# Patient Record
Sex: Male | Born: 1945 | Race: Black or African American | Hispanic: No | Smoking: Current every day smoker
Health system: Southern US, Community
[De-identification: ages and names within clinical notes are randomized; demographics above are authoritative.]

## PROBLEM LIST (undated history)

## (undated) DIAGNOSIS — D649 Anemia, unspecified: Secondary | ICD-10-CM

## (undated) DIAGNOSIS — N186 End stage renal disease: Secondary | ICD-10-CM

## (undated) DIAGNOSIS — E119 Type 2 diabetes mellitus without complications: Secondary | ICD-10-CM

## (undated) DIAGNOSIS — I2510 Atherosclerotic heart disease of native coronary artery without angina pectoris: Secondary | ICD-10-CM

## (undated) DIAGNOSIS — I509 Heart failure, unspecified: Secondary | ICD-10-CM

## (undated) DIAGNOSIS — I213 ST elevation (STEMI) myocardial infarction of unspecified site: Secondary | ICD-10-CM

## (undated) DIAGNOSIS — I639 Cerebral infarction, unspecified: Secondary | ICD-10-CM

## (undated) DIAGNOSIS — H547 Unspecified visual loss: Secondary | ICD-10-CM

## (undated) DIAGNOSIS — I10 Essential (primary) hypertension: Secondary | ICD-10-CM

## (undated) DIAGNOSIS — M1990 Unspecified osteoarthritis, unspecified site: Secondary | ICD-10-CM

## (undated) DIAGNOSIS — F419 Anxiety disorder, unspecified: Secondary | ICD-10-CM

## (undated) DIAGNOSIS — I714 Abdominal aortic aneurysm, without rupture, unspecified: Secondary | ICD-10-CM

## (undated) DIAGNOSIS — R51 Headache: Secondary | ICD-10-CM

## (undated) DIAGNOSIS — E46 Unspecified protein-calorie malnutrition: Secondary | ICD-10-CM

## (undated) DIAGNOSIS — Z992 Dependence on renal dialysis: Secondary | ICD-10-CM

## (undated) HISTORY — DX: Anxiety disorder, unspecified: F41.9

## (undated) HISTORY — DX: Type 2 diabetes mellitus without complications: E11.9

## (undated) HISTORY — PX: OTHER SURGICAL HISTORY: SHX169

## (undated) HISTORY — DX: Essential (primary) hypertension: I10

## (undated) HISTORY — DX: Cerebral infarction, unspecified: I63.9

## (undated) SURGERY — Surgical Case
Anesthesia: *Unknown

---
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
## 2000-09-25 ENCOUNTER — Encounter: Admission: RE | Admit: 2000-09-25 | Discharge: 2000-09-25 | Payer: Self-pay | Admitting: General Surgery

## 2000-09-25 ENCOUNTER — Encounter: Payer: Self-pay | Admitting: General Surgery

## 2007-01-19 ENCOUNTER — Emergency Department (HOSPITAL_COMMUNITY): Admission: EM | Admit: 2007-01-19 | Discharge: 2007-01-19 | Payer: Self-pay | Admitting: Emergency Medicine

## 2010-03-28 ENCOUNTER — Emergency Department (HOSPITAL_COMMUNITY): Admission: EM | Admit: 2010-03-28 | Discharge: 2010-03-28 | Payer: Self-pay | Admitting: Emergency Medicine

## 2011-07-15 ENCOUNTER — Encounter: Payer: Self-pay | Admitting: Cardiology

## 2011-08-06 ENCOUNTER — Encounter: Payer: Self-pay | Admitting: *Deleted

## 2011-08-07 ENCOUNTER — Encounter: Payer: Self-pay | Admitting: Cardiology

## 2011-08-07 ENCOUNTER — Ambulatory Visit (INDEPENDENT_AMBULATORY_CARE_PROVIDER_SITE_OTHER): Payer: Medicare Other | Admitting: Cardiology

## 2011-08-07 ENCOUNTER — Ambulatory Visit (INDEPENDENT_AMBULATORY_CARE_PROVIDER_SITE_OTHER)
Admission: RE | Admit: 2011-08-07 | Discharge: 2011-08-07 | Disposition: A | Discharge status: Home / Self Care | Payer: Medicare Other | Source: Ambulatory Visit | Attending: Cardiology | Admitting: Cardiology

## 2011-08-07 ENCOUNTER — Encounter: Payer: Self-pay | Admitting: *Deleted

## 2011-08-07 DIAGNOSIS — R079 Chest pain, unspecified: Secondary | ICD-10-CM | POA: Insufficient documentation

## 2011-08-07 DIAGNOSIS — R072 Precordial pain: Secondary | ICD-10-CM

## 2011-08-07 DIAGNOSIS — Z01810 Encounter for preprocedural cardiovascular examination: Secondary | ICD-10-CM

## 2011-08-07 DIAGNOSIS — R0789 Other chest pain: Secondary | ICD-10-CM

## 2011-08-07 DIAGNOSIS — Z720 Tobacco use: Secondary | ICD-10-CM | POA: Insufficient documentation

## 2011-08-07 DIAGNOSIS — R0989 Other specified symptoms and signs involving the circulatory and respiratory systems: Secondary | ICD-10-CM

## 2011-08-07 DIAGNOSIS — I959 Hypotension, unspecified: Secondary | ICD-10-CM | POA: Insufficient documentation

## 2011-08-07 LAB — CBC WITH DIFFERENTIAL/PLATELET
Basophils Absolute: 0.1 K/uL (ref 0.0–0.1)
Basophils Relative: 1.3 % (ref 0.0–3.0)
Eosinophils Absolute: 0.5 K/uL (ref 0.0–0.7)
Eosinophils Relative: 7.6 % — ABNORMAL HIGH (ref 0.0–5.0)
HCT: 36 % — ABNORMAL LOW (ref 39.0–52.0)
Hemoglobin: 11.9 g/dL — ABNORMAL LOW (ref 13.0–17.0)
Lymphocytes Relative: 24.4 % (ref 12.0–46.0)
Lymphs Abs: 1.7 K/uL (ref 0.7–4.0)
MCHC: 33.1 g/dL (ref 30.0–36.0)
MCV: 87.2 fl (ref 78.0–100.0)
Monocytes Absolute: 0.5 K/uL (ref 0.1–1.0)
Monocytes Relative: 7.8 % (ref 3.0–12.0)
Neutro Abs: 4 K/uL (ref 1.4–7.7)
Neutrophils Relative %: 58.9 % (ref 43.0–77.0)
Platelets: 212 K/uL (ref 150.0–400.0)
RBC: 4.13 Mil/uL — ABNORMAL LOW (ref 4.22–5.81)
RDW: 13.4 % (ref 11.5–14.6)
WBC: 6.8 K/uL (ref 4.5–10.5)

## 2011-08-07 LAB — BASIC METABOLIC PANEL
BUN: 35 mg/dL — ABNORMAL HIGH (ref 6–23)
Chloride: 115 mEq/L — ABNORMAL HIGH (ref 96–112)
Creatinine, Ser: 2.9 mg/dL — ABNORMAL HIGH (ref 0.4–1.5)

## 2011-08-07 LAB — PROTIME-INR: Prothrombin Time: 11.8 s (ref 10.2–12.4)

## 2011-08-07 MED ORDER — AMLODIPINE BESYLATE 10 MG PO TABS: 10.0000 mg | ORAL_TABLET | Freq: Every day | ORAL | Status: DC

## 2011-08-07 NOTE — Assessment & Plan Note (Signed)
Schedule abdominal ultrasound to exclude aneurysm.

## 2011-08-07 NOTE — Assessment & Plan Note (Signed)
Patient counseled on discontinuing. 

## 2011-08-07 NOTE — Progress Notes (Signed)
HPI: 65 year old male for evaluation of dyspnea and chest pain. The patient has had dyspnea on exertion for approximately one year. It is occurring with less activity. It is relieved with rest. No orthopnea, PND, pedal edema or syncope. He also has chest tightness with exertion relieved with rest. There is shortness of breath but no nausea or diaphoresis. The pain is not pleuritic. Because of the above we were asked to further evaluate.  Current Outpatient Prescriptions  Medication Sig Dispense Refill  . atenolol (TENORMIN) 50 MG tablet Take 50 mg by mouth daily.        . nitroGLYCERIN (NITROSTAT) 0.4 MG SL tablet Place 0.4 mg under the tongue every 5 (five) minutes as needed.          No Known Allergies  Past Medical History  Diagnosis Date  . HTN (hypertension)   . Anxiety     No past surgical history on file.  History   Social History  . Marital Status: Single    Spouse Name: N/A    Number of Children: 2  . Years of Education: N/A   Occupational History  . Not on file.   Social History Main Topics  . Smoking status: Current Everyday Smoker  . Smokeless tobacco: Not on file  . Alcohol Use: Yes     Occasional  . Drug Use: Not on file  . Sexually Active: Not on file   Other Topics Concern  . Not on file   Social History Narrative  . No narrative on file    Family History  Problem Relation Age of Onset  . Heart attack Mother     MI in her 49s    ROS: no fevers or chills, productive cough, hemoptysis, dysphasia, odynophagia, melena, hematochezia, dysuria, hematuria, rash, seizure activity, orthopnea, PND, pedal edema, claudication. Remaining systems are negative.  Physical Exam: General:  Well developed/well nourished in NAD Skin warm/dry Patient not depressed No peripheral clubbing Back-normal HEENT-normal/normal eyelids Neck supple/normal carotid upstroke bilaterally; no bruits; no JVD; no thyromegaly chest - CTA/ normal expansion CV - bradycardic/normal  S1 and S2; no murmurs, rubs or gallops;  PMI nondisplaced Abdomen -NT/ND, no HSM, + bowel sounds, soft bruit with question pulsatile mass. 2+ femoral pulses, no bruits Ext-no edema, chords, diminished distal pulses Neuro-grossly nonfocal  ECG marked sinus bradycardia at a rate of 36. Left ventricular hypertrophy with repolarization abnormality.

## 2011-08-07 NOTE — Assessment & Plan Note (Signed)
Patient is significantly bradycardic. No syncope. Discontinue atenolol which was recently initiated and treat hypertension with Norvasc 10 mg daily. Follow blood pressure and pulse and adjust as needed.

## 2011-08-07 NOTE — Assessment & Plan Note (Signed)
Chest pain symptoms are concerning for new onset angina. Plan add aspirin. Proceed with cardiac catheterization. The risks and benefits including but not limited to myocardial infarction, stroke and death were discussed and patient agrees to proceed.

## 2011-08-07 NOTE — Patient Instructions (Addendum)
Your physician has requested that you have a cardiac catheterization. Cardiac catheterization is used to diagnose and/or treat various heart conditions. Doctors may recommend this procedure for a number of different reasons. The most common reason is to evaluate chest pain. Chest pain can be a symptom of coronary artery disease (CAD), and cardiac catheterization can show whether plaque is narrowing or blocking your heart's arteries. This procedure is also used to evaluate the valves, as well as measure the blood flow and oxygen levels in different parts of your heart. For further information please visit [email or web address]. Please follow instruction sheet, as given.   STOP ATENOLOL  START AMLODIPINE 10 MG ONCE DAILY START ASPIRIN 24 MG ONCE DAILY WITH FOOD  Your physician recommends that you return for lab work in: TODAY  A chest x-ray takes a picture of the organs and structures inside the chest, including the heart, lungs, and blood vessels. This test can show several things, including, whether the heart is enlarges; whether fluid is building up in the lungs; and whether pacemaker / defibrillator leads are still in place. AT [place] physician has requested that you have an abdominal aorta duplex. During this test, an ultrasound is used to evaluate the aorta. Allow 30 minutes for this exam. Do not eat after midnight the day before and avoid carbonated beverages    Your physician recommends that you schedule a follow-up appointment in: 6 WEEKS

## 2011-08-08 ENCOUNTER — Telehealth: Payer: Self-pay | Admitting: Cardiology

## 2011-08-08 DIAGNOSIS — N289 Disorder of kidney and ureter, unspecified: Secondary | ICD-10-CM

## 2011-08-08 NOTE — Telephone Encounter (Signed)
Caregiver returning nurse called from yesterday.

## 2011-08-08 NOTE — Telephone Encounter (Signed)
Spoke with pt caregiver,twana, she is aware of lab results. Pt will be scheduled for lexiscan myoview, instructions discussed over the phone.

## 2011-08-11 ENCOUNTER — Other Ambulatory Visit: Payer: Medicare Other | Admitting: *Deleted

## 2011-08-12 ENCOUNTER — Ambulatory Visit (HOSPITAL_COMMUNITY): Payer: Medicare Other | Attending: Cardiology | Admitting: Radiology

## 2011-08-12 ENCOUNTER — Other Ambulatory Visit (INDEPENDENT_AMBULATORY_CARE_PROVIDER_SITE_OTHER): Payer: Medicare Other | Admitting: *Deleted

## 2011-08-12 DIAGNOSIS — N289 Disorder of kidney and ureter, unspecified: Secondary | ICD-10-CM

## 2011-08-12 DIAGNOSIS — I498 Other specified cardiac arrhythmias: Secondary | ICD-10-CM

## 2011-08-12 DIAGNOSIS — R0602 Shortness of breath: Secondary | ICD-10-CM

## 2011-08-12 DIAGNOSIS — R079 Chest pain, unspecified: Secondary | ICD-10-CM

## 2011-08-12 DIAGNOSIS — R9431 Abnormal electrocardiogram [ECG] [EKG]: Secondary | ICD-10-CM

## 2011-08-12 DIAGNOSIS — R0989 Other specified symptoms and signs involving the circulatory and respiratory systems: Secondary | ICD-10-CM

## 2011-08-12 MED ORDER — REGADENOSON 0.4 MG/5ML IV SOLN
0.4000 mg | Freq: Once | INTRAVENOUS | Status: AC
  Administered 2011-08-12: 0.4 mg via INTRAVENOUS

## 2011-08-12 MED ORDER — TECHNETIUM TC 99M TETROFOSMIN IV KIT
11.0000 | PACK | Freq: Once | INTRAVENOUS | Status: AC | PRN
  Administered 2011-08-12: 11 via INTRAVENOUS

## 2011-08-12 MED ORDER — TECHNETIUM TC 99M TETROFOSMIN IV KIT
33.0000 | PACK | Freq: Once | INTRAVENOUS | Status: AC | PRN
  Administered 2011-08-12: 33 via INTRAVENOUS

## 2011-08-12 NOTE — Progress Notes (Signed)
[place] [address] [phone]  Cardiology Nuclear Med Trevor [name] is a 65 y.o. male [id number] March 04, 1946   Nuclear Med Background Indication for Stress Test:  Evaluation for Ischemia History:  No previous documented CAD and Abnormal EKG Cardiac Risk Factors: Family History - CAD, Hypertension and Smoker  Symptoms:  Chest Pain, Chest Pressure and DOE   Nuclear Pre-Procedure Caffeine/Decaff Intake:  None NPO After: 10:00pm   Lungs:  clear IV 0.9% NS with Angio Cath:  20g  IV Site: R Hand  IV Started by:  Matilde Haymaker, RN  Chest Size (in):  38 Cup Size: n/a  Height: 5\' 10"  (1.778 m)  Weight:  135 lb (61.236 kg)  BMI:  Body mass index is 19.37 kg/(m^2). Tech Comments:  NA    Nuclear Med Study 1 or 2 day study: 1 day  Stress Test Type:  Carlton Adam  Reading MD: Kirk Ruths, MD  Order Authorizing Provider:  B.Mckinley Olheiser  Resting Radionuclide: Technetium 35m Tetrofosmin  Resting Radionuclide Dose: 11.0 mCi   Stress Radionuclide:  Technetium 50m Tetrofosmin  Stress Radionuclide Dose: 33.0 mCi           Stress Protocol Rest HR: 41 Stress HR: 64  Rest BP: 142/73 Stress BP: 151/69  Exercise Time (min): n/a METS: n/a   Predicted Max HR: 155 bpm % Max HR: 41.29 bpm Rate Pressure Product: 9664   Dose of Adenosine (mg):  n/a Dose of Lexiscan: 0.4 mg  Dose of Atropine (mg): n/a Dose of Dobutamine: n/a mcg/kg/min (at max HR)  Stress Test Technologist: Perrin Maltese, EMT-P  Nuclear Technologist:  Charlton Amor, CNMT     Rest Procedure:  Myocardial perfusion imaging was performed at rest 45 minutes following the intravenous administration of Technetium 24m Tetrofosmin. Rest ECG: Sinus Bradycardia  Stress Procedure:  The patient received IV Lexiscan 0.4 mg over 15-seconds.  Technetium 36m Tetrofosmin injected at 30-seconds.  There were no significant changes with Lexiscan.  Quantitative spect  images were obtained after a 45 minute delay. Stress ECG: Uninterpretable due to baseline LVH with repolarization abnormality.  QPS Raw Data Images:  Acquisition technically good; evidence of LVE. Stress Images:  There is decreased uptake in the apex. Rest Images:  There is decreased uptake in the apex. Subtraction (SDS):  No evidence of ischemia. Transient Ischemic Dilatation (Normal <1.22):  1.02 Lung/Heart Ratio (Normal <0.45):  0.24  Quantitative Gated Spect Images QGS EDV:  148 ml QGS ESV:  88 ml QGS cine images:  Global hypokinesis. QGS EF: 40%  Impression Exercise Capacity:  Lexiscan with no exercise. BP Response:  Normal blood pressure response. Clinical Symptoms:  No chest pain. ECG Impression:  Baseline:  LVH.  EKG uninterpretable due to LVH at rest and stress. Comparison with Prior Nuclear Study: No images to compare  Overall Impression:  Abnormal stress nuclear study with apical thinning and global hypokinesis.   Kirk Ruths

## 2011-08-13 LAB — BASIC METABOLIC PANEL
BUN: 27 mg/dL — ABNORMAL HIGH (ref 6–23)
Chloride: 116 mEq/L — ABNORMAL HIGH (ref 96–112)
GFR: 34.53 mL/min — ABNORMAL LOW (ref >60.00)
Potassium: 4.8 mEq/L (ref 3.5–5.1)
Sodium: 140 mEq/L (ref 135–145)

## 2011-08-28 ENCOUNTER — Telehealth: Payer: Self-pay | Admitting: Cardiology

## 2011-08-28 NOTE — Telephone Encounter (Signed)
Paperwork has been sent to [facility]. Waiting for their call about an appt. Pt can call them at [phone] if he wants. Unable to reach pt or leave a message Trevor [name]

## 2011-08-28 NOTE — Telephone Encounter (Signed)
Pt called. And wanted to know where his referral is to kidney physician please let him know

## 2011-08-29 ENCOUNTER — Other Ambulatory Visit: Payer: Self-pay | Admitting: Nephrology

## 2011-08-29 DIAGNOSIS — N184 Chronic kidney disease, stage 4 (severe): Secondary | ICD-10-CM

## 2011-09-04 ENCOUNTER — Ambulatory Visit
Admission: RE | Admit: 2011-09-04 | Discharge: 2011-09-04 | Disposition: A | Discharge status: Home / Self Care | Payer: Medicare Other | Source: Ambulatory Visit | Attending: Nephrology | Admitting: Nephrology

## 2011-09-04 DIAGNOSIS — N184 Chronic kidney disease, stage 4 (severe): Secondary | ICD-10-CM

## 2011-09-08 ENCOUNTER — Encounter: Payer: Medicare Other | Admitting: Cardiology

## 2011-09-10 ENCOUNTER — Ambulatory Visit: Payer: Medicare Other | Admitting: Cardiology

## 2011-09-18 ENCOUNTER — Ambulatory Visit (INDEPENDENT_AMBULATORY_CARE_PROVIDER_SITE_OTHER): Payer: Medicare Other | Admitting: Cardiology

## 2011-09-18 ENCOUNTER — Encounter: Payer: Self-pay | Admitting: Cardiology

## 2011-09-18 DIAGNOSIS — I10 Essential (primary) hypertension: Secondary | ICD-10-CM

## 2011-09-18 DIAGNOSIS — N289 Disorder of kidney and ureter, unspecified: Secondary | ICD-10-CM | POA: Insufficient documentation

## 2011-09-18 DIAGNOSIS — R0989 Other specified symptoms and signs involving the circulatory and respiratory systems: Secondary | ICD-10-CM

## 2011-09-18 DIAGNOSIS — I428 Other cardiomyopathies: Secondary | ICD-10-CM | POA: Insufficient documentation

## 2011-09-18 MED ORDER — CARVEDILOL 6.25 MG PO TABS: 6.2500 mg | ORAL_TABLET | Freq: Two times a day (BID) | ORAL | Status: DC

## 2011-09-18 NOTE — Assessment & Plan Note (Signed)
Etiology unclear. Myoview showed apical thinning but no ischemia. Ordinarily I would like to proceed with cardiac catheterization to exclude coronary disease. However we will not do this at present given the severity of his renal insufficiency. Continue aspirin. His heart rate is much better after discontinuing atenolol. His bradycardia was most likely from the atenolol in the setting of renal insufficiency. Add Coreg 6.25 mg p.o. B.i.d. Both for his reduced LV function and blood pressure. Increase as tolerated. I will not add ACE inhibitor given the severity of his renal insufficiency. We will consider hydralazine and nitrates in the future. He is not clear on his medications. He will bring it back in 4 weeks and we will adjust as needed. Repeat echocardiogram once blood pressure control and medications titrated. His cardiomyopathy may be secondary to hypertension. Check TSH and ferritin. HIV was ordered by nephrology.

## 2011-09-18 NOTE — Progress Notes (Signed)
HPI: pleasant male I initially saw in September of 2012 with symptoms concerning for angina. We scheduled a cardiac catheterization but this was canceled because screening laboratories showed significant renal insufficiency (Cr 2.9). A Myoview was therefore performed in September of 2012 and revealed apical thinning, global hypokinesis and an ejection fraction of 40%. Renal dopplers revealed no RAS. Abdominal ultrasound was scheduled for bruit but patient did not show for the study. Since he was last seen, he feels somewhat better. He does have dyspnea on exertion but no orthopnea, PND or pedal edema. He notices a sharp pain in his chest with over exertion. No syncope.  Current Outpatient Prescriptions  Medication Sig Dispense Refill  . amLODipine (NORVASC) 10 MG tablet Take 1 tablet (10 mg total) by mouth daily.  30 tablet  11  . aspirin EC 81 MG tablet Take 1 tablet (81 mg total) by mouth daily.  150 tablet  2  . nitroGLYCERIN (NITROSTAT) 0.4 MG SL tablet Place 0.4 mg under the tongue every 5 (five) minutes as needed.           Past Medical History  Diagnosis Date  . HTN (hypertension)   . Anxiety   . Cardiomyopathy   . Renal insufficiency     No past surgical history on file.  History   Social History  . Marital Status: Single    Spouse Name: N/A    Number of Children: 2  . Years of Education: N/A   Occupational History  . Not on file.   Social History Main Topics  . Smoking status: Current Everyday Smoker  . Smokeless tobacco: Not on file  . Alcohol Use: Yes     Occasional  . Drug Use: Not on file  . Sexually Active: Not on file   Other Topics Concern  . Not on file   Social History Narrative  . No narrative on file    ROS: no fevers or chills, productive cough, hemoptysis, dysphasia, odynophagia, melena, hematochezia, dysuria, hematuria, rash, seizure activity, orthopnea, PND, pedal edema, claudication. Remaining systems are negative.  Physical  Exam: Well-developed well-nourished in no acute distress.  Skin is warm and dry.  HEENT is normal.  Neck is supple. No thyromegaly.  Chest is clear to auscultation with normal expansion.  Cardiovascular exam is regular rate and rhythm.  Abdominal exam nontender or distended. No masses palpated. Extremities show no edema. neuro grossly intact  ECG Sinus rhythm, diffuse TWI

## 2011-09-18 NOTE — Assessment & Plan Note (Signed)
Add Coreg 6.25 mg p.o. B.i.d. Increase medications as needed.

## 2011-09-18 NOTE — Assessment & Plan Note (Signed)
Now being followed by nephrology.

## 2011-09-18 NOTE — Patient Instructions (Addendum)
Your physician recommends that you schedule a follow-up appointment in: 4 WEEKS  START CARVEDILOL 6.25 MG ONE TABLET TWICE DAILY  MAKE SURE TO BRING ALL MEDICINE BOTTLES TO NEXT APPOINTMENT  Your physician recommends that you return for lab work in: [place] has requested that you have an abdominal aorta duplex. During this test, an ultrasound is used to evaluate the aorta. Allow 30 minutes for this exam. Do not eat after midnight the day before and avoid carbonated beverages     MAKE SURE TO FOLLOW UP WITH THE KIDNEY DOCTOR=DR [phone]

## 2011-09-18 NOTE — Assessment & Plan Note (Addendum)
Abdominal bruit-schedule ultrasound to exclude aneurysm.

## 2011-09-19 LAB — LIPID PANEL
LDL Cholesterol: 97 mg/dL (ref 0–99)
Total CHOL/HDL Ratio: 4
VLDL: 18 mg/dL (ref 0.0–40.0)

## 2011-10-07 ENCOUNTER — Emergency Department (HOSPITAL_COMMUNITY): Payer: Medicare Other

## 2011-10-07 ENCOUNTER — Other Ambulatory Visit: Payer: Self-pay

## 2011-10-07 ENCOUNTER — Encounter (HOSPITAL_COMMUNITY): Payer: Self-pay | Admitting: *Deleted

## 2011-10-07 ENCOUNTER — Inpatient Hospital Stay (HOSPITAL_COMMUNITY)
Admission: EM | Admit: 2011-10-07 | Discharge: 2011-10-10 | DRG: 065 | Disposition: A | Discharge status: Skilled Nursing Facility | Payer: Medicare Other | Attending: Internal Medicine | Admitting: Internal Medicine

## 2011-10-07 DIAGNOSIS — I498 Other specified cardiac arrhythmias: Secondary | ICD-10-CM | POA: Diagnosis present

## 2011-10-07 DIAGNOSIS — R072 Precordial pain: Secondary | ICD-10-CM

## 2011-10-07 DIAGNOSIS — I5043 Acute on chronic combined systolic (congestive) and diastolic (congestive) heart failure: Secondary | ICD-10-CM

## 2011-10-07 DIAGNOSIS — I5022 Chronic systolic (congestive) heart failure: Secondary | ICD-10-CM | POA: Diagnosis present

## 2011-10-07 DIAGNOSIS — N185 Chronic kidney disease, stage 5: Secondary | ICD-10-CM

## 2011-10-07 DIAGNOSIS — I428 Other cardiomyopathies: Secondary | ICD-10-CM | POA: Diagnosis present

## 2011-10-07 DIAGNOSIS — Z720 Tobacco use: Secondary | ICD-10-CM | POA: Insufficient documentation

## 2011-10-07 DIAGNOSIS — F17200 Nicotine dependence, unspecified, uncomplicated: Secondary | ICD-10-CM | POA: Diagnosis present

## 2011-10-07 DIAGNOSIS — Z01810 Encounter for preprocedural cardiovascular examination: Secondary | ICD-10-CM

## 2011-10-07 DIAGNOSIS — I639 Cerebral infarction, unspecified: Secondary | ICD-10-CM

## 2011-10-07 DIAGNOSIS — Z23 Encounter for immunization: Secondary | ICD-10-CM

## 2011-10-07 DIAGNOSIS — I509 Heart failure, unspecified: Secondary | ICD-10-CM | POA: Diagnosis present

## 2011-10-07 DIAGNOSIS — R001 Bradycardia, unspecified: Secondary | ICD-10-CM

## 2011-10-07 DIAGNOSIS — I4891 Unspecified atrial fibrillation: Secondary | ICD-10-CM | POA: Diagnosis present

## 2011-10-07 DIAGNOSIS — I517 Cardiomegaly: Secondary | ICD-10-CM

## 2011-10-07 DIAGNOSIS — W19XXXA Unspecified fall, initial encounter: Secondary | ICD-10-CM | POA: Diagnosis present

## 2011-10-07 DIAGNOSIS — R0989 Other specified symptoms and signs involving the circulatory and respiratory systems: Secondary | ICD-10-CM

## 2011-10-07 DIAGNOSIS — R4701 Aphasia: Secondary | ICD-10-CM | POA: Diagnosis present

## 2011-10-07 DIAGNOSIS — N183 Chronic kidney disease, stage 3 unspecified: Secondary | ICD-10-CM | POA: Diagnosis present

## 2011-10-07 DIAGNOSIS — H53469 Homonymous bilateral field defects, unspecified side: Secondary | ICD-10-CM | POA: Diagnosis present

## 2011-10-07 DIAGNOSIS — F1410 Cocaine abuse, uncomplicated: Secondary | ICD-10-CM

## 2011-10-07 DIAGNOSIS — I6340 Cerebral infarction due to embolism of unspecified cerebral artery: Principal | ICD-10-CM | POA: Diagnosis present

## 2011-10-07 DIAGNOSIS — N179 Acute kidney failure, unspecified: Secondary | ICD-10-CM

## 2011-10-07 DIAGNOSIS — Z8673 Personal history of transient ischemic attack (TIA), and cerebral infarction without residual deficits: Secondary | ICD-10-CM

## 2011-10-07 DIAGNOSIS — I129 Hypertensive chronic kidney disease with stage 1 through stage 4 chronic kidney disease, or unspecified chronic kidney disease: Secondary | ICD-10-CM | POA: Diagnosis present

## 2011-10-07 DIAGNOSIS — R471 Dysarthria and anarthria: Secondary | ICD-10-CM | POA: Diagnosis present

## 2011-10-07 LAB — PROTIME-INR
INR: 0.97 (ref 0.00–1.49)
Prothrombin Time: 13.1 seconds (ref 11.6–15.2)

## 2011-10-07 LAB — URINE MICROSCOPIC-ADD ON

## 2011-10-07 LAB — COMPREHENSIVE METABOLIC PANEL
ALT: 9 U/L (ref 0–53)
Albumin: 2.9 g/dL — ABNORMAL LOW (ref 3.5–5.2)
Alkaline Phosphatase: 95 U/L (ref 39–117)
BUN: 31 mg/dL — ABNORMAL HIGH (ref 6–23)
Chloride: 112 mEq/L (ref 96–112)
GFR calc Af Amer: 23 mL/min — ABNORMAL LOW (ref >90)
Glucose, Bld: 93 mg/dL (ref 70–99)
Potassium: 4.7 mEq/L (ref 3.5–5.1)
Sodium: 138 mEq/L (ref 135–145)
Total Bilirubin: 0.3 mg/dL (ref 0.3–1.2)
Total Protein: 6.7 g/dL (ref 6.0–8.3)

## 2011-10-07 LAB — RAPID URINE DRUG SCREEN, HOSP PERFORMED
Amphetamines: NOT DETECTED
Barbiturates: NOT DETECTED
Benzodiazepines: NOT DETECTED
Tetrahydrocannabinol: NOT DETECTED

## 2011-10-07 LAB — URINALYSIS, ROUTINE W REFLEX MICROSCOPIC
Bilirubin Urine: NEGATIVE
Glucose, UA: NEGATIVE mg/dL
Ketones, ur: NEGATIVE mg/dL
Protein, ur: >300 mg/dL — AB
pH: 5.5 (ref 5.0–8.0)

## 2011-10-07 LAB — DIFFERENTIAL
Eosinophils Absolute: 0.6 10*3/uL (ref 0.0–0.7)
Lymphs Abs: 1.9 10*3/uL (ref 0.7–4.0)
Monocytes Relative: 9 % (ref 3–12)
Neutro Abs: 3.8 10*3/uL (ref 1.7–7.7)
Neutrophils Relative %: 54 % (ref 43–77)

## 2011-10-07 LAB — CBC
Hemoglobin: 11.1 g/dL — ABNORMAL LOW (ref 13.0–17.0)
Platelets: 193 10*3/uL (ref 150–400)
RBC: 3.91 MIL/uL — ABNORMAL LOW (ref 4.22–5.81)
WBC: 7 10*3/uL (ref 4.0–10.5)

## 2011-10-07 LAB — POCT I-STAT, CHEM 8
Calcium, Ion: 1.17 mmol/L (ref 1.12–1.32)
Glucose, Bld: 96 mg/dL (ref 70–99)
HCT: 33 % — ABNORMAL LOW (ref 39.0–52.0)
Hemoglobin: 11.2 g/dL — ABNORMAL LOW (ref 13.0–17.0)
TCO2: 17 mmol/L (ref 0–100)

## 2011-10-07 MED ORDER — HYDRALAZINE HCL 25 MG PO TABS
25.0000 mg | ORAL_TABLET | Freq: Three times a day (TID) | ORAL | Status: DC
  Administered 2011-10-07 – 2011-10-10 (×7): 25 mg via ORAL
  Filled 2011-10-07 (×11): qty 1

## 2011-10-07 MED ORDER — ACETAMINOPHEN 325 MG PO TABS: 650.0000 mg | ORAL_TABLET | ORAL | Status: DC | PRN

## 2011-10-07 MED ORDER — SODIUM CHLORIDE 0.9 % IV SOLN
INTRAVENOUS | Status: DC
  Administered 2011-10-08 (×3): via INTRAVENOUS

## 2011-10-07 MED ORDER — HYDRALAZINE HCL 20 MG/ML IJ SOLN
10.0000 mg | INTRAMUSCULAR | Status: DC | PRN
  Administered 2011-10-08 – 2011-10-09 (×2): 10 mg via INTRAVENOUS
  Filled 2011-10-07 (×3): qty 0.5

## 2011-10-07 MED ORDER — ONDANSETRON HCL 4 MG/2ML IJ SOLN: 4.0000 mg | Freq: Three times a day (TID) | INTRAMUSCULAR | Status: DC | PRN

## 2011-10-07 MED ORDER — HEPARIN SODIUM (PORCINE) 5000 UNIT/ML IJ SOLN
5000.0000 [IU] | Freq: Three times a day (TID) | INTRAMUSCULAR | Status: DC
  Administered 2011-10-07 – 2011-10-10 (×8): 5000 [IU] via SUBCUTANEOUS
  Filled 2011-10-07 (×12): qty 1

## 2011-10-07 MED ORDER — ONDANSETRON HCL 4 MG/2ML IJ SOLN: 4.0000 mg | Freq: Four times a day (QID) | INTRAMUSCULAR | Status: DC | PRN

## 2011-10-07 MED ORDER — ASPIRIN 300 MG RE SUPP
300.0000 mg | Freq: Every day | RECTAL | Status: DC
  Filled 2011-10-07: qty 1

## 2011-10-07 MED ORDER — ACETAMINOPHEN 650 MG RE SUPP: 650.0000 mg | RECTAL | Status: DC | PRN

## 2011-10-07 MED ORDER — NITROGLYCERIN 0.4 MG SL SUBL: 0.4000 mg | SUBLINGUAL_TABLET | SUBLINGUAL | Status: DC | PRN

## 2011-10-07 MED ORDER — SODIUM CHLORIDE 0.9 % IV SOLN: INTRAVENOUS | Status: DC

## 2011-10-07 MED ORDER — ASPIRIN 325 MG PO TABS
325.0000 mg | ORAL_TABLET | Freq: Every day | ORAL | Status: DC
  Administered 2011-10-07 – 2011-10-10 (×4): 325 mg via ORAL
  Filled 2011-10-07 (×4): qty 1

## 2011-10-07 NOTE — ED Notes (Signed)
Patient is resting comfortably. 

## 2011-10-07 NOTE — ED Notes (Signed)
Asked patient if he needed anything at this time.  Response was no.

## 2011-10-07 NOTE — ED Notes (Signed)
Echo tech at bedside.

## 2011-10-07 NOTE — ED Notes (Signed)
Patients been asked if he is need of anything.  Negative.

## 2011-10-07 NOTE — Progress Notes (Signed)
CTSP by RN re uncontrolled SBP. Chart reviewed/patient examined. Currently he endorses continued RLE weakness that has not improved since admission. His BP is 219/65 and HR between 36 and 41 bpm which is not a new problem (see H/P). As an OP his cardiologist stopped his Lopressor because of the bradycardia. Preadmit he was on Norvasc and Coreg which were placed on hold. Due to uncontrolled HTN with associated bradycardia I will start Hydralazine 25mg  PO TID and also give hydralazine 10mg  IV q 4hours prn SBP>/= 180. This will drug will also help with afterload reduction regarding his grade 1 diastolic dysfunction.  Agree will Ebony Hail

## 2011-10-07 NOTE — ED Notes (Signed)
ekg has been done and given to dr. Jacqualin Combes.

## 2011-10-07 NOTE — Code Documentation (Signed)
65 yo male who was at home; housemate heard him fall at 60; he was in the kitchen making orange juice, and fell against the refrigerator.  He instructed her to call 9-1-1. EMS activated code stroke at [phone]. Pt arrived at 0904 and was cleared by EDP at that time.  Stroke team arrived at 0907.  Initially the house mate said the pt was LSN at 0750, but after questioning, she actually did not see pt since 2300 on 11/5 when he was in his usual state of health.  NIHSS is 14, but pt is not eligible for tPA or intervention d/t outside timeframe.

## 2011-10-07 NOTE — ED Provider Notes (Signed)
History     CSN: [id number] Arrival date & time: 10/07/2011  9:04 AM   First MD Initiated Contact with Patient 10/07/11 [phone]      Chief Complaint  Patient presents with  . Code Stroke    (Consider location/radiation/quality/duration/timing/severity/associated sxs/prior treatment) Patient is a 65 y.o. male presenting with fall. The history is provided by the EMS personnel. The history is limited by the condition of the patient.  Fall The accident occurred less than 1 hour ago. The fall occurred while walking. He fell from a height of 3 to 5 ft. He landed on carpet. There was no blood loss. The pain is moderate. He was not ambulatory at the scene. There was no entrapment after the fall. There was no drug use involved in the accident. There was no alcohol use involved in the accident. Pertinent negatives include no fever, no abdominal pain, no nausea and no vomiting. He has tried nothing for the symptoms.  Upon EMS arrival, pt weak on R side.  En route pt became non verbal.    Past Medical History  Diagnosis Date  . HTN (hypertension)   . Anxiety   . Cardiomyopathy   . Renal insufficiency     History reviewed. No pertinent past surgical history.  Family History  Problem Relation Age of Onset  . Heart attack Mother     MI in her 44s    History  Substance Use Topics  . Smoking status: Current Everyday Smoker  . Smokeless tobacco: Not on file  . Alcohol Use: Yes     Occasional      Review of Systems  Constitutional: Negative for fever and chills.  Gastrointestinal: Negative for nausea, vomiting and abdominal pain.  All other systems reviewed and are negative.    Allergies  Review of patient's allergies indicates no known allergies.  Home Medications   Current Outpatient Rx  Name Route Sig Dispense Refill  . AMLODIPINE BESYLATE 10 MG PO TABS Oral Take 1 tablet (10 mg total) by mouth daily. 30 tablet 11  . ASPIRIN EC 81 MG PO TBEC Oral Take 1 tablet (81 mg total)  by mouth daily. 150 tablet 2  . CARVEDILOL 6.25 MG PO TABS Oral Take 1 tablet (6.25 mg total) by mouth 2 (two) times daily. 60 tablet 11  . NITROGLYCERIN 0.4 MG SL SUBL Sublingual Place 0.4 mg under the tongue every 5 (five) minutes as needed.        There were no vitals taken for this visit.  Physical Exam  Nursing note and vitals reviewed. Constitutional: He appears well-developed and well-nourished. No distress.  HENT:  Head: Normocephalic and atraumatic.  Eyes: EOM are normal. Pupils are equal, round, and reactive to light.  Neck: Normal range of motion. Neck supple.  Cardiovascular: Normal rate and regular rhythm.   Pulmonary/Chest: Effort normal and breath sounds normal. No respiratory distress.  Abdominal: Soft. There is no tenderness.  Musculoskeletal: He exhibits no tenderness.  Neurological: He is alert. No cranial nerve deficit.       Equal grips bilaterally, will not raise R arm, unable to raise R leg.  Aphasia, Decreased sensation on R. ? Mild R facial droop  Skin: Skin is warm and dry.    ED Course  Procedures (including critical care time)  Labs Reviewed  CBC - Abnormal; Notable for the following:    RBC 3.91 (*)    Hemoglobin 11.1 (*)    HCT 32.0 (*)    All other components  within normal limits  DIFFERENTIAL - Abnormal; Notable for the following:    Eosinophils Relative 8 (*)    Basophils Relative 2 (*)    All other components within normal limits  COMPREHENSIVE METABOLIC PANEL - Abnormal; Notable for the following:    CO2 18 (*)    BUN 31 (*)    Creatinine, Ser 3.07 (*)    Albumin 2.9 (*)    GFR calc non Af Amer 20 (*)    GFR calc Af Amer 23 (*)    All other components within normal limits  POCT I-STAT, CHEM 8 - Abnormal; Notable for the following:    Chloride 115 (*)    BUN 34 (*)    Creatinine, Ser 3.20 (*)    Hemoglobin 11.2 (*)    HCT 33.0 (*)    All other components within normal limits  PROTIME-INR  URINALYSIS, ROUTINE W REFLEX MICROSCOPIC    URINE RAPID DRUG SCREEN (HOSP PERFORMED)   Ct Head Wo Contrast  10/07/2011  *RADIOLOGY REPORT*  Clinical Data: Code stroke, right-sided weakness, a fascia  CT HEAD WITHOUT CONTRAST  Technique:  Contiguous axial images were obtained from the base of the skull through the vertex without contrast.  Comparison: None.  Findings: The ventricular system is within normal limits in size for age and only mild cortical atrophy is present.  There is low attenuation within the right occipital lobe consistent with old infarct.  Mild small vessel ischemic change is present.  No hemorrhage, mass lesion, or acute infarction is seen.  On bone window images, no bony abnormality is seen.  IMPRESSION:  1.  No acute intracranial abnormality. 2.  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 3.  Mild atrophy and small vessel disease.  Critical Value/emergent results were called by telephone at the time of interpretation on 10/07/2011  at 9:25 a.m.  to  Dr. Audie Pinto, who verbally acknowledged these results.  Original Report Authenticated By: Joretta Bachelor, M.D.   Dg Chest Portable 1 View  10/07/2011  *RADIOLOGY REPORT*  Clinical Data: Recent fall, altered mental status  PORTABLE CHEST - 1 VIEW  Comparison: Chest x-ray of 08/07/2011  Findings: The lungs are clear.  The heart is mildly enlarged and stable.  No bony abnormality is seen.  IMPRESSION: Stable mild cardiomegaly.  No active lung disease.  Original Report Authenticated By: Joretta Bachelor, M.D.     No diagnosis found.    Date: 10/07/2011  Rate: 40  Rhythm: ectopic atrial bradycardia  QRS Axis: normal  Intervals: normal  ST/T Wave abnormalities: T wave inversion laterally  Conduction Disutrbances:none  Narrative Interpretation:   Old EKG Reviewed: none available   MDM  Pt presented due to R sided weakness after fall this am.  Last seen normal yesterday.  Per person living with them, he was talking this am but she does not know when symptoms started.  He is aphasic, R sided  weakness.  Neuro at bedside.  CT neg for anything acute.  Neuro recommends admit to medicine.  Does not want to do lytics since cannot get definite time frame.  Consulted hospitalist.          Molli Posey, MD Resident 10/07/11 [place], MD 10/15/11 [phone]

## 2011-10-07 NOTE — ED Provider Notes (Signed)
I saw and evaluated the patient, reviewed the resident's note and I agree with the findings and plan.     Date: 10/07/2011  Rate: 40  Rhythm: sinus bradycardia  QRS Axis: normal  Intervals: normal  ST/T Wave abnormalities: nonspecific ST/T changes  Conduction Disutrbances:none  Narrative Interpretation:   Old EKG Reviewed: none available      Dot Lanes, MD 10/07/11 1137

## 2011-10-07 NOTE — Progress Notes (Signed)
*  PRELIMINARY RESULTS* Echocardiogram 2D Echocardiogram has been performed.  Roxine Caddy [facility] 10/07/2011, 2:55 PM

## 2011-10-07 NOTE — ED Notes (Signed)
Per EMS pt from home. Family reports pt fell at 08:10. Family reports after fall pt had right side deficit, non-verbal. Pt presents with right sided weakness, can't lift right arm. Hypertensive at 208/96, bradycardic 35. Pt non-verbal with EMS. IV 20G in left forearm. CBG 108.

## 2011-10-07 NOTE — ED Notes (Signed)
Patients temperature not able to get orally.  (stroke rn/rita"wasn't important at this time")

## 2011-10-07 NOTE — Consult Note (Signed)
Reason for Consult:right sided weakness Referring Physician: Audie [name]  CC: Right sided weakness  HPI: Trevor [name] is an 65 y.o. male is asked to the ED after being found down on the floor after a fall by friend. It seems that the patient was last seen normal at approximately 11 PM last evening by his friend. It seemed that he awakened this morning and was able to get to the kitchen and start preparing food but if he was at baseline at that time. The patient did not interact with anyone prior to being found on the floor. The patient is unable to give any degree of accurate history at this time.  Past Medical History  Diagnosis Date  . HTN (hypertension)   . Anxiety   . Cardiomyopathy   . Renal insufficiency        BPH      Bradycardia  History reviewed. No pertinent past surgical history.  Family History  Problem Relation Age of Onset  . Heart attack Mother     MI in her 16s    Social History:  reports that he has been smoking.  He does not have any smokeless tobacco history on file. He reports that he drinks alcohol. His drug history not on file. On review of old ED notes it was seen that the patient does have a history of crack use.  No Known Allergies  Medications: Unknown at this time  ROS: Unable to obtain  Blood pressure 158/107, pulse 32, temperature 97.5 F (36.4 C), temperature source Oral, resp. rate 16, SpO2 100.00%.  Neurologic Examination: Mental Status: Alert. Speaks little but is able to get one or two words out as appropriate answers to questions.  Able to follow simple commands.  Although no distinct neglect can be appreciated seems to defer to the left. Cranial Nerves: II-Discs flat bilaterally. Visual fields grossly intact. III/IV/VI-Extraocular movements intact.  Pupils reactive bilaterally. V/VII-Decrease in right NLF. VIII-grossly intact IX/X-decreased gag XI-decreased right shoulder shrug XII-midline tongue extension Motor: Increased tone on the  right. RUE strength at 2/5, 0-1/5 RLE strength. 5/5 strength on the left. Sensory: Pinprick and light touch decreased on the right Deep Tendon Reflexes: 2+ and symmetric throughout with absent AJ's bilaterally Plantars: Downgoing on the left and upgoing on the right Cerebellar: Normal finger-to-nose and heel-to-shin testing not performed.   Results for orders placed during the hospital encounter of 10/07/11 (from the past 48 hour(s))  CBC     Status: Abnormal   Collection Time   10/07/11  9:23 AM      Component Value Range Comment   WBC 7.0  4.0 - 10.5 (K/uL)    RBC 3.91 (*) 4.22 - 5.81 (MIL/uL)    Hemoglobin 11.1 (*) 13.0 - 17.0 (g/dL)    HCT 32.0 (*) 39.0 - 52.0 (%)    MCV 81.8  78.0 - 100.0 (fL)    MCH 28.4  26.0 - 34.0 (pg)    MCHC 34.7  30.0 - 36.0 (g/dL)    RDW 13.6  11.5 - 15.5 (%)    Platelets 193  150 - 400 (K/uL)   DIFFERENTIAL     Status: Abnormal   Collection Time   10/07/11  9:23 AM      Component Value Range Comment   Neutrophils Relative 54  43 - 77 (%)    Neutro Abs 3.8  1.7 - 7.7 (K/uL)    Lymphocytes Relative 28  12 - 46 (%)    Lymphs Abs 1.9  0.7 - 4.0 (K/uL)    Monocytes Relative 9  3 - 12 (%)    Monocytes Absolute 0.6  0.1 - 1.0 (K/uL)    Eosinophils Relative 8 (*) 0 - 5 (%)    Eosinophils Absolute 0.6  0.0 - 0.7 (K/uL)    Basophils Relative 2 (*) 0 - 1 (%)    Basophils Absolute 0.1  0.0 - 0.1 (K/uL)   PROTIME-INR     Status: Normal   Collection Time   10/07/11  9:23 AM      Component Value Range Comment   Prothrombin Time 13.1  11.6 - 15.2 (seconds)    INR 0.97  0.00 - 1.49    POCT I-STAT, CHEM 8     Status: Abnormal   Collection Time   10/07/11  9:38 AM      Component Value Range Comment   Sodium 143  135 - 145 (mEq/L)    Potassium 4.8  3.5 - 5.1 (mEq/L)    Chloride 115 (*) 96 - 112 (mEq/L)    BUN 34 (*) 6 - 23 (mg/dL)    Creatinine, Ser 3.20 (*) 0.50 - 1.35 (mg/dL)    Glucose, Bld 96  70 - 99 (mg/dL)    Calcium, Ion 1.17  1.12 - 1.32 (mmol/L)     TCO2 17  0 - 100 (mmol/L)    Hemoglobin 11.2 (*) 13.0 - 17.0 (g/dL)    HCT 33.0 (*) 39.0 - 52.0 (%)     Ct Head Wo Contrast  10/07/2011  *RADIOLOGY REPORT*  Clinical Data: Code stroke, right-sided weakness, a fascia  CT HEAD WITHOUT CONTRAST  Technique:  Contiguous axial images were obtained from the base of the skull through the vertex without contrast.  Comparison: None.  Findings: The ventricular system is within normal limits in size for age and only mild cortical atrophy is present.  There is low attenuation within the right occipital lobe consistent with old infarct.  Mild small vessel ischemic change is present.  No hemorrhage, mass lesion, or acute infarction is seen.  On bone window images, no bony abnormality is seen.  IMPRESSION:  1.  No acute intracranial abnormality. 2.  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 3.  Mild atrophy and small vessel disease.  Critical Value/emergent results were called by telephone at the time of interpretation on 10/07/2011  at 9:25 a.m.  to  Dr. Audie [name], who verbally acknowledged these results.  Original Report Authenticated By: Joretta Bachelor, M.D.     Assessment/Plan:  [name] is a 65 year old male that presents with difficulty with speech and right-sided numbness and weakness. Suspect left brain stroke.  Head CT shows no acute changes.  Patient has risk factors of hypertension and cardiomyopathy. Also a smoker and has a history of illicit drug abuse. With a definite last seen normal time of 11 PM last evening the patient is out that time window for TPA and therefore TPA was not administered today. He will still require admission and workup/management for his acute stroke.  1. MRI of the brain without contrast 2. Urine drug screen  3. Echocardiogram and carotid Dopplers  4. Fasting lipid panel and hemoglobin A1c 5. PT OT and speech therapy consults 6. Patient will need home medications to be investigated. If patient is on no anticoagulants would start aspirin  325 mg daily.  This will need to be given as 300 mg per rectum until swallowing has been fully evaluated.  Will re-evaluate this choice of prophylactic medication after  medication list is available.  Trevor Goodell, MD [facility] [phone] 10/07/2011, 10:01 AM

## 2011-10-07 NOTE — ED Notes (Signed)
Rn/sabrina ordered collection of urine(in and out).  Order-verbal.  Color of urine cloudy/red(appears to have blood).  Collection of the urine was uneventful.

## 2011-10-07 NOTE — H&P (Addendum)
Trevor [name] [id number] 12-28-1945  Referring physician: PCP: Karoline Caldwell Referring physician: Dr. Audie Pinto in the emergency department. Cardiologist: Kirk Ruths, M.D. Nephrologist: Mauricia Area, M.D.  Chief Complaint: Slurred speech  HPI:  Transported from home by EMS. Patient fell at 810 this morning. Housemate heard him fall.  Right upper extremity weakness was noted as was hypertension and bradycardia. Patient was noted to be nonverbal. Code stroke was activated. Last seen normal 2300 on November 5. NIHSS equals 14. Not eligible for TPA secondary to being outside of the time window.  Patient was seen by Dr. Doy Mince of neurology. He was noted at that time to follow simple commands and be able to speak a few words. CT head was negative it was recommended the patient be referred for admission.  At that time of my examination the patient is able to say yes or no. He cannot tell me what happened. He can provide very little history.  He was noted to have marked sinus bradycardia in the emergency room.  Seen in the office by Dr. Stanford Breed 08/07/2011 and noted at that time to have a marked sinus bradycardia with a rate of 36. Left ventricular hypertrophy with repolarization abnormality. Atenolol was discontinued at that time and Norvasc was started. Cardiac catheterization was planned for chest pain. He was seen in followup on October 18 again by Dr. Stanford Breed. His Myoview was grossly abnormal as detailed below. Given the patient's kidney disease cardiac catheterization was not pursued. Choroid was added at that time 6.25 mg by mouth twice a day. ACE inhibitor was not added secondary to renal disease. At that time his medications were:  carvedilol 6.25 mg by mouth twice a day Norvasc 10 mg by mouth twice a day Aspirin 81 mg by mouth daily next nitroglycerin sublingual 0.4 mg every 5 minutes as needed  Past Medical History  Diagnosis Date  . HTN (hypertension)   . Anxiety   .  Cardiomyopathy   . Renal insufficiency   Myoview was therefore performed in September of 2012 and revealed apical thinning, global hypokinesis and an ejection fraction of 40%.   History reviewed. No pertinent past surgical history.  Social History:  reports that he has been smoking.  He does not have any smokeless tobacco history on file. He reports that he drinks alcohol. His drug history not on file. Allergies: No Known Allergies Family History  Problem Relation Age of Onset  . Heart attack Mother     MI in her 77s   Prior to Admission medications   Medication Sig Start Date End Date Taking? Authorizing Provider  amLODipine (NORVASC) 10 MG tablet Take 1 tablet (10 mg total) by mouth daily. 08/07/11 08/06/12  Lelon Perla, MD  aspirin EC 81 MG tablet Take 1 tablet (81 mg total) by mouth daily. 08/07/11   Lelon Perla, MD  carvedilol (COREG) 6.25 MG tablet Take 1 tablet (6.25 mg total) by mouth 2 (two) times daily. 09/18/11 09/17/12  Lelon Perla, MD  nitroGLYCERIN (NITROSTAT) 0.4 MG SL tablet Place 0.4 mg under the tongue every 5 (five) minutes as needed.      Historical Provider, MD   Review of Systems:  Denies fever changes to his vision sore throat rash chest pain shortness of breath nausea vomiting abdominal pain diarrhea dysuria. Review of systems is unreliable secondary to aphasia.  Physical Exam: General:  Lying flat in bed in the emergency room. Nontoxic. Telemetry shows a bradycardia at a rate of 30-40. Eyes: Pupils equal  round react to light. Normal lids irises the conjunctiva. ENT: Grossly normal lips and tongue. Neck: No lymphadenopathy or masses. No thyromegaly. Cardiovascular: Regular rate, bradycardic. No murmur rub or gallop, no lower extremity edema Respiratory: Clear to auscultation bilaterally. No wheezes, rales, rhonchi. Abdomen: Soft, nontender, nondistended. Skin: Appears unremarkable. No rash lesions or or induration seen. Musculoskeletal: Grossly normal  tone in the left upper and left lower extremity. Increased tone in the right upper extremity. He is able to raise his right hand above the bed and grip her grip strength is weaker compared to the left side. Psychiatric: Unable to assess Neurologic: Cranial nerves appear grossly unremarkable.  Labs on Admission:  Results for orders placed during the hospital encounter of 10/07/11 (from the past 24 hour(s))  CBC     Status: Abnormal   Collection Time   10/07/11  9:23 AM      Component Value Range   WBC 7.0  4.0 - 10.5 (K/uL)   RBC 3.91 (*) 4.22 - 5.81 (MIL/uL)   Hemoglobin 11.1 (*) 13.0 - 17.0 (g/dL)   HCT 32.0 (*) 39.0 - 52.0 (%)   MCV 81.8  78.0 - 100.0 (fL)   MCH 28.4  26.0 - 34.0 (pg)   MCHC 34.7  30.0 - 36.0 (g/dL)   RDW 13.6  11.5 - 15.5 (%)   Platelets 193  150 - 400 (K/uL)  DIFFERENTIAL     Status: Abnormal   Collection Time   10/07/11  9:23 AM      Component Value Range   Neutrophils Relative 54  43 - 77 (%)   Neutro Abs 3.8  1.7 - 7.7 (K/uL)   Lymphocytes Relative 28  12 - 46 (%)   Lymphs Abs 1.9  0.7 - 4.0 (K/uL)   Monocytes Relative 9  3 - 12 (%)   Monocytes Absolute 0.6  0.1 - 1.0 (K/uL)   Eosinophils Relative 8 (*) 0 - 5 (%)   Eosinophils Absolute 0.6  0.0 - 0.7 (K/uL)   Basophils Relative 2 (*) 0 - 1 (%)   Basophils Absolute 0.1  0.0 - 0.1 (K/uL)  COMPREHENSIVE METABOLIC PANEL     Status: Abnormal   Collection Time   10/07/11  9:23 AM      Component Value Range   Sodium 138  135 - 145 (mEq/L)   Potassium 4.7  3.5 - 5.1 (mEq/L)   Chloride 112  96 - 112 (mEq/L)   CO2 18 (*) 19 - 32 (mEq/L)   Glucose, Bld 93  70 - 99 (mg/dL)   BUN 31 (*) 6 - 23 (mg/dL)   Creatinine, Ser 3.07 (*) 0.50 - 1.35 (mg/dL)   Calcium 9.1  8.4 - 10.5 (mg/dL)   Total Protein 6.7  6.0 - 8.3 (g/dL)   Albumin 2.9 (*) 3.5 - 5.2 (g/dL)   AST 12  0 - 37 (U/L)   ALT 9  0 - 53 (U/L)   Alkaline Phosphatase 95  39 - 117 (U/L)   Total Bilirubin 0.3  0.3 - 1.2 (mg/dL)   GFR calc non Af Amer 20 (*)  >90 (mL/min)   GFR calc Af Amer 23 (*) >90 (mL/min)  PROTIME-INR     Status: Normal   Collection Time   10/07/11  9:23 AM      Component Value Range   Prothrombin Time 13.1  11.6 - 15.2 (seconds)   INR 0.97  0.00 - 1.49   POCT I-STAT, CHEM 8     Status:  Abnormal   Collection Time   10/07/11  9:38 AM      Component Value Range   Sodium 143  135 - 145 (mEq/L)   Potassium 4.8  3.5 - 5.1 (mEq/L)   Chloride 115 (*) 96 - 112 (mEq/L)   BUN 34 (*) 6 - 23 (mg/dL)   Creatinine, Ser 3.20 (*) 0.50 - 1.35 (mg/dL)   Glucose, Bld 96  70 - 99 (mg/dL)   Calcium, Ion 1.17  1.12 - 1.32 (mmol/L)   TCO2 17  0 - 100 (mmol/L)   Hemoglobin 11.2 (*) 13.0 - 17.0 (g/dL)   HCT 33.0 (*) 39.0 - 52.0 (%)  URINALYSIS, ROUTINE W REFLEX MICROSCOPIC     Status: Abnormal   Collection Time   10/07/11 10:42 AM      Component Value Range   Color, Urine YELLOW  YELLOW    Appearance CLOUDY (*) CLEAR    Specific Gravity, Urine 1.014  1.005 - 1.030    pH 5.5  5.0 - 8.0    Glucose, UA NEGATIVE  NEGATIVE (mg/dL)   Hgb urine dipstick LARGE (*) NEGATIVE    Bilirubin Urine NEGATIVE  NEGATIVE    Ketones, ur NEGATIVE  NEGATIVE (mg/dL)   Protein, ur >300 (*) NEGATIVE (mg/dL)   Urobilinogen, UA 0.2  0.0 - 1.0 (mg/dL)   Nitrite NEGATIVE  NEGATIVE    Leukocytes, UA NEGATIVE  NEGATIVE   URINE MICROSCOPIC-ADD ON     Status: Abnormal   Collection Time   10/07/11 10:42 AM      Component Value Range   Squamous Epithelial / LPF FEW (*) RARE    WBC, UA 0-2  <3 (WBC/hpf)   RBC / HPF TOO NUMEROUS TO COUNT  <3 (RBC/hpf)   Bacteria, UA FEW (*) RARE    Casts GRANULAR CAST (*) NEGATIVE   URINE RAPID DRUG SCREEN (HOSP PERFORMED)     Status: Abnormal   Collection Time   10/07/11 10:43 AM      Component Value Range   Opiates NONE DETECTED  NONE DETECTED    Cocaine POSITIVE (*) NONE DETECTED    Benzodiazepines NONE DETECTED  NONE DETECTED    Amphetamines NONE DETECTED  NONE DETECTED    Tetrahydrocannabinol NONE DETECTED  NONE DETECTED      Barbiturates NONE DETECTED  NONE DETECTED     Radiological Exams on Admission: Ct Head Wo Contrast  10/07/2011  *RADIOLOGY REPORT*  Clinical Data: Code stroke, right-sided weakness, a fascia  CT HEAD WITHOUT CONTRAST  Technique:  Contiguous axial images were obtained from the base of the skull through the vertex without contrast.  Comparison: None.  Findings: The ventricular system is within normal limits in size for age and only mild cortical atrophy is present.  There is low attenuation within the right occipital lobe consistent with old infarct.  Mild small vessel ischemic change is present.  No hemorrhage, mass lesion, or acute infarction is seen.  On bone window images, no bony abnormality is seen.  IMPRESSION:  1.  No acute intracranial abnormality. 2.  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 3.  Mild atrophy and small vessel disease.  Critical Value/emergent results were called by telephone at the time of interpretation on 10/07/2011  at 9:25 a.m.  to  Dr. Audie Pinto, who verbally acknowledged these results.  Original Report Authenticated By: Joretta Bachelor, M.D.   Dg Chest Portable 1 View  10/07/2011  *RADIOLOGY REPORT*  Clinical Data: Recent fall, altered mental status  PORTABLE CHEST -  1 VIEW  Comparison: Chest x-ray of 08/07/2011  Findings: The lungs are clear.  The heart is mildly enlarged and stable.  No bony abnormality is seen.  IMPRESSION: Stable mild cardiomegaly.  No active lung disease.  Original Report Authenticated By: Joretta Bachelor, M.D.     EKG: Independently reviewed. Marked bradycardia, narrow complex. Possible atrial ectopic rhythm. Left ventricular hypertrophy with repolarization abnormality. No acute changes.  Assessment/Plan  65 year old man presenting with speech and right-sided numbness and weakness.  Dysarthria and right upper extremity weakness: As below.  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 He has been evaluated by neurology. We will admit for stroke  evaluation.  Marked sinus bradycardia: Secondary to beta blocker therapy. Previously had marked sinus bradycardia on atenolol. Will discontinue Coreg. Monitor for need to intervene with atropine were temporary pacing. Currently asymptomatic and hemodynamically stable.  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 Gentle IV fluids.  Substance abuse, cocaine: Clinical social work [place].  Chronic systolic heart failure: Compensated. No beta blocker therapy secondary to marked sinus bradycardia as well as cocaine use. Has not been on ACE inhibitor in the past secondary to his chronic kidney disease. He appears to be slightly dry today.   Code Status: Full Family Communication: None present Disposition Plan: Pending further evaluation.     Mayer Masker MD 10/07/2011, 12:55 PM

## 2011-10-08 ENCOUNTER — Encounter (HOSPITAL_COMMUNITY): Payer: Self-pay | Admitting: *Deleted

## 2011-10-08 ENCOUNTER — Inpatient Hospital Stay (HOSPITAL_COMMUNITY): Payer: Medicare Other

## 2011-10-08 DIAGNOSIS — I4891 Unspecified atrial fibrillation: Secondary | ICD-10-CM | POA: Diagnosis present

## 2011-10-08 DIAGNOSIS — I498 Other specified cardiac arrhythmias: Secondary | ICD-10-CM

## 2011-10-08 DIAGNOSIS — I6789 Other cerebrovascular disease: Secondary | ICD-10-CM

## 2011-10-08 DIAGNOSIS — I6350 Cerebral infarction due to unspecified occlusion or stenosis of unspecified cerebral artery: Secondary | ICD-10-CM

## 2011-10-08 LAB — LIPID PANEL
HDL: 33 mg/dL — ABNORMAL LOW (ref >39)
Triglycerides: 111 mg/dL (ref <150)

## 2011-10-08 LAB — HEMOGLOBIN A1C
Hgb A1c MFr Bld: 5.4 % (ref <5.7)
Mean Plasma Glucose: 108 mg/dL (ref <117)

## 2011-10-08 MED ORDER — AMLODIPINE BESYLATE 10 MG PO TABS
10.0000 mg | ORAL_TABLET | Freq: Every day | ORAL | Status: DC
  Administered 2011-10-08 – 2011-10-10 (×3): 10 mg via ORAL
  Filled 2011-10-08 (×3): qty 1

## 2011-10-08 NOTE — Consult Note (Signed)
Patient seen and examined and history reviewed. Agree with above findings and plan. No evidence of atrial fibrillation on monitor. Sinus brady with pac's. Given CVA will schedule TEE tomorrow. Patient has chronic LV dysfunction with EF of 40%. He is not a candidate for ACEi or ARB due to CKD. Not a candidate for beta blocker due to bradycardia. Appropriate therapy would be combination of hydralazine and nitrates for BP and LV dysfunction. No evidence of volume overload currently.  Luana Shu 10/08/2011 4:57 PM

## 2011-10-08 NOTE — Progress Notes (Addendum)
Pt has only voided x1 during my shift today. Urine was clear, dark brown. Dr. Thereasa Solo notified of urine output.  IVF infusing per md order. No orders received.    Trevor [name], Kaylyn Layer

## 2011-10-08 NOTE — Progress Notes (Signed)
Subjective: I have personally reviewed the patient's history and data base. I have discussed his care with the stroke service. At the present time the patient is sitting up in a chair at the bedside and resting comfortably. He denies chest pain fevers chills nausea vomiting shortness of breath or abdominal pain. He is exhibiting some mild word finding delay but is otherwise able to communicate well.  Objective: Weight change:   Intake/Output Summary (Last 24 hours) at 10/08/11 1136 Last data filed at 10/08/11 0900  Gross per 24 hour  Intake    495 ml  Output    825 ml  Net   -330 ml   Blood pressure 160/61, pulse 55, temperature 98.6 F (37 C), temperature source Oral, resp. rate 16, height 6\' 1"  (1.854 m), weight 63.6 kg (140 lb 3.4 oz), SpO2 100.00%. Temp:  [97.3 F (36.3 C)-98.6 F (37 C)] 98.6 F (37 C) (11/07 0900) Pulse Rate:  [36-55] 55  (11/07 1013) Resp:  [13-18] 16  (11/07 0900) BP: (132-219)/(54-114) 160/61 mmHg (11/07 0900) SpO2:  [99 %-100 %] 100 % (11/07 0900) Weight:  [63.6 kg (140 lb 3.4 oz)] 140 lb 3.4 oz (63.6 kg) (11/06 1855)  Physical Exam: General: No acute respiratory distress Lungs: Clear to auscultation bilaterally without wheezes or crackles Cardiovascular: Bradycardia is appreciable with an irregularly irregular rhythm at the time of my auscultation without appreciable gallop rub or murmur Abdomen: Nontender, nondistended, soft, bowel sounds positive, no rebound, no ascites, no appreciable mass Extremities: No significant cyanosis, clubbing, or edema bilateral lower extremities Neurologic: I agree with the exam as per the stroke team-please see their note  Lab Results:  [facility] 10/07/11 0938 10/07/11 0923  NA 143 138  K 4.8 4.7  CL 115* 112  CO2 -- 18*  GLUCOSE 96 93  BUN 34* 31*  CREATININE 3.20* 3.07*  CALCIUM -- 9.1  MG -- --  PHOS -- --    Basename 10/07/11 0923  AST 12  ALT 9  ALKPHOS 95  BILITOT 0.3  PROT 6.7  ALBUMIN 2.9*   No  results found for this basename: LIPASE:2,AMYLASE:2 in the last 72 hours  Basename 10/07/11 0938 10/07/11 0923  WBC -- 7.0  NEUTROABS -- 3.8  HGB 11.2* 11.1*  HCT 33.0* 32.0*  MCV -- 81.8  PLT -- 193   No results found for this basename: CKTOTAL:3,CKMB:3,CKMBINDEX:3,TROPONINI:3 in the last 72 hours No results found for this basename: POCBNP:3 in the last 72 hours No results found for this basename: DDIMER:2 in the last 72 hours No results found for this basename: HGBA1C:12 in the last 72 hours  Basename 10/08/11 0530  CHOL 148  HDL 33*  LDLCALC 93  TRIG 111  CHOLHDL 4.5  LDLDIRECT --   No results found for this basename: TSH,T4TOTAL,FREET3,T3FREE,THYROIDAB in the last 72 hours No results found for this basename: VITAMINB12:2,FOLATE:2,FERRITIN:2,TIBC:2,IRON:2,RETICCTPCT:2 in the last 72 hours  Micro Results: Recent Results (from the past 240 hour(s))  MRSA PCR SCREENING     Status: Normal   Collection Time   10/07/11  8:04 PM      Component Value Range Status Comment   MRSA by PCR NEGATIVE  NEGATIVE  Final     Studies/Results: Scheduled Meds:   . aspirin  300 mg Rectal Daily   Or  . aspirin  325 mg Oral Daily  . heparin  5,000 Units Subcutaneous Q8H  . hydrALAZINE  25 mg Oral Q8H  . DISCONTD: sodium chloride   Intravenous STAT  Continuous Infusions:   . sodium chloride 75 mL/hr at 10/08/11 1000   PRN Meds:.acetaminophen, acetaminophen, hydrALAZINE, nitroGLYCERIN, ondansetron (ZOFRAN) IV, DISCONTD: ondansetron (ZOFRAN) IV  Assessment/Plan:  Stroke MRI reveals evidence of cerebral strokes. I have discussed the case with the stroke team who is following along. We feel that a TEE is indicated given the bilateral nature of this patient's acute strokes. I have consulted the patient's cardiologist to accomplish this. For now we will continue aspirin therapy. If his TEE suggests a cardiac source of embolus we will need to consider full anticoagulation. End the patient  passed his swallowing screen we will begin a regular diet.  Cardiomyopathy As noted above a TEE is to be accomplished. There is no evidence of severe volume overload or an acute exacerbation of heart failure.  Cocaine abuse The patient has been counseled the absolute need to discontinue cocaine abuse. We will ask the social worker continue to educate him on the opportunities available in the community to assist him in doing so.  Sinus bradycardia The majority the patient's telemetry at this point is indicated a sinus bradycardia. At the time of my exam however the patient is definitely irregularly irregular consistent with a atrial fibrillation. We will discontinue the patient's beta blockers. I have asked the patient's cardiologist to see him in consultation.  Acute renal failure with Chronic kidney disease (CKD), stage III (moderate) The patient's baseline renal function appears to be 2.5-2.8. I suspect that his current exacerbation is due to a combination of dehydration as well as poor perfusion related to severe bradycardia. We'll assure that the patient is well hydrated and we will follow his renal function.  A-fib Please see my discussion above. Cardiology has been consulted.    LOS: 1 day   Takeya Marquis T 10/08/2011, 11:36 AM

## 2011-10-08 NOTE — Progress Notes (Signed)
Stroke Team Progress Note Subjective: Mr. Delante is a 65 year old male that presents with difficulty with speech and right-sided numbness and weakness. Suspect left brain stroke. Head CT shows no acute changes. Patient has risk factors of hypertension and cardiomyopathy. Also a smoker and has a history of illicit drug abuse. With a definite last seen normal time of 11 PM last evening the patient is out that time window for TPA and therefore TPA was not administered .he has shown slight improvement overnight he is able to speak but still has some mild expressive difficulties. His right lower extremity remains weak her right arm strength has improved Objective:d. Vital signs in last 8 hours: Blood pressure 160/61, pulse 55, temperature 98.6 F (37 C), temperature source Oral, resp. rate 16, height 6\' 1"  (1.854 m), weight 63.6 kg (140 lb 3.4 oz), SpO2 100.00%.  Intake/Output from previous day: 11/06 0701 - 11/07 0700 In: 495 [I.V.:495] Out: 825 [Urine:825]   IV Intake:    . sodium chloride 75 mL/hr at 10/08/11 1000    Diet: General, thin liquids  Activity: Up with assistance  DVT Prophylaxis:  Sq heparin  Physical Exam:  Alert. Speaks speaks but is nonfluent. There is definite word hesitancy. He is able to name and repeat well. He has good comprehension. He is able to tell me on the names of 5 animals in 1 minute.Cranial Nerves:  II- Visual acuity intact. Dense left homonymous hemianopsia III/IV/VI-Extraocular movements intact. Pupils reactive bilaterally.  V/VII-Decrease in right NLF.  VIII-grossly intact  IX/X-decreased gag  XI-decreased right shoulder shrug  XII-midline tongue extension  Motor: Increased tone on the right. RUE strength at 4/5, 0-1/5 RLE strength. 5/5 strength on the left. Mild right upper extremity drift present. Diminished fine finger movements on the right. Orbits left over right upper extremity. Right grip is weak. Sensory: Pinprick and light touch decreased on  the right  Deep Tendon Reflexes: 2+ and symmetric throughout with absent AJ's bilaterally  Plantars: Downgoing on the left and upgoing on the right  Cerebellar: Normal finger-to-nose and heel-to-shin testing not performed.   Studies: Results for orders placed during the hospital encounter of 10/07/11 (from the past 24 hour(s))  MRSA PCR SCREENING     Status: Normal   Collection Time   10/07/11  8:04 PM      Component Value Range   MRSA by PCR NEGATIVE  NEGATIVE   LIPID PANEL     Status: Abnormal   Collection Time   10/08/11  5:30 AM      Component Value Range   Cholesterol 148  0 - 200 (mg/dL)   Triglycerides 111  <150 (mg/dL)   HDL 33 (*) >39 (mg/dL)   Total CHOL/HDL Ratio 4.5     VLDL 22  0 - 40 (mg/dL)   LDL Cholesterol 93  0 - 99 (mg/dL)   MRI brain:  L ACA small patchy  embolic infarcts, R PCA infarct old plus new components. MRA of the brain shows focal narrowing of the mid portions of the left anterior cerebral and right posterior cerebral arteries suggestive of embolic disease. 2-D echo shows ejection fraction of 40-45% with moderate left ventricular hypertrophy. No definite cardiac thrombus is noted.  Assessment: SHRI ENFINGER is an 65 y.o. year old male with bilateral embolic infarcts etiology to be determined. He has significant risk factors of hypertension and cardiomyopathy.He has an old right posterior cerebral artery infarct on CT scan but denies any clinical symptoms related to that though on  exam he has an obvious left homonymous hemianopsia.[facility] day # 1.  Treatment/Plan: TEE in  Am for cardiac source of embolism. Start amlodipine for hypertension. Stop coreg due to bradycardia.physical occupational and speech therapy consults. Check hemoglobin A1c and Doppler studies. Mobilize out of bed. Likely need a rehabilitation consult. Continue aspirin 325 mg a day for now.  BIBY,SHARON AVNP, ANP-BC, GNP-BC,  [facility] 11/7/201211:08 AM

## 2011-10-08 NOTE — Consult Note (Signed)
CARDIOLOGY CONSULT NOTE  Patient ID: Trevor [name] MRN: [id number] DOB/AGE: 1945/12/07 65 y.o.  Admit date: 10/07/2011 Referring Physician Dr Thereasa Solo   Primary Physician  [facility] Primary Cardiologist  Dr Stanford Breed  Reason for Consultation  CVA, ?AFib  HPI:    Trevor [name] is a 65 y.o. male without a history of CAD.      He developed RLE numbness 11-3. He came to the ER and was admitted with a CVA. No chest pain or shortness of breath. The patient was evaluated by Dr. Thereasa Solo and there was concern for atrial fibrillation. In the setting of a CVA he also needs an TEE and cardiology was asked to evaluate him.  Trevor [name] has had no chest pain or shortness of breath. He still complains of right lower extremity weakness and was not able to stand on his right leg today. He does not feel his heart skipped beats even though he is having frequent PACs on telemetry. Telemetry was reviewed and no atrial fibrillation.  A telemetry strip in the computer was seen where his heart rate was irregular secondary to premature beats and there was artifact mimicking atrial fibrillation but we do not believe it was true atrial fibrillation. Currently Trevor [name]  is resting comfortably. He is compliant with blood pressure medications but continues to smoke. Of note, his urinalysis was positive for cocaine.   Past Medical History  Diagnosis Date  . HTN (hypertension)   . Anxiety   . Cardiomyopathy   . Renal insufficiency    A Myoview was  performed in September of 2012 and revealed apical thinning, global hypokinesis and an ejection fraction of 40%  History reviewed. No pertinent past surgical history.  No Known Allergies Prescriptions prior to admission  Medication Sig Dispense Refill  . amLODipine (NORVASC) 10 MG tablet Take 1 tablet (10 mg total) by mouth daily.  30 tablet  11  . aspirin EC 81 MG tablet Take 1 tablet (81 mg total) by mouth daily.  150 tablet  2  . carvedilol (COREG) 6.25 MG tablet  Take 1 tablet (6.25 mg total) by mouth 2 (two) times daily.  60 tablet  11  . nitroGLYCERIN (NITROSTAT) 0.4 MG SL tablet Place 0.4 mg under the tongue every 5 (five) minutes as needed.          History   Social History  . Marital Status: Single    Spouse Name: N/A    Number of Children: 2  . Years of Education: N/A   Occupational History  . Not on file.   Social History Main Topics  . Smoking status: Current Everyday Smoker  . Smokeless tobacco: Not on file  . Alcohol Use: Yes     Occasional  . Drug Use: Not on file  . Sexually Active: Not on file   Other Topics Concern  . Not on file   Social History Narrative  . No narrative on file    Family History  Problem Relation Age of Onset  . Heart attack Mother     MI in her 82s      Full 14 point review of systems complete and found to be negative unless listed  above  Physical Exam: Blood pressure 170/57, pulse 51, temperature 98.6 F (37 C), temperature source Oral, resp. rate 14, height 6\' 1"  (1.854 m), weight 140 lb 3.4 oz (63.6 kg), SpO2 98.00%.  General: Well developed, well nourished, in no acute distress Head: Eyes PERRLA, No xanthomas.  Normal cephalic and atramatic, poor dentition   Lungs: Clear bilaterally to auscultation and percussion. Heart: HRRR S1 S2, without MRG.  Pulses are 2+ & equal radially. LE pulses slightly decreased, R>L but palpable and capillary refill WNL.            No carotid bruit. No JVD.  No abdominal bruits. No femoral bruits. Abdomen: Bowel sounds are positive, abdomen soft and non-tender without masses or                  Hernia's noted. Msk: No joint deformity or effusions. Extremities: No clubbing, cyanosis or edema.  DP +1 Neuro: Alert and oriented X 3. Patient is unable to move his right lower extremity, right upper extremity strength is decreased. Psych:  Good affect, responds appropriately but has trouble finding words.  Labs:   Lab Results  Component Value Date   WBC 7.0  10/07/2011   HGB 11.2* 10/07/2011   HCT 33.0* 10/07/2011   MCV 81.8 10/07/2011   PLT 193 10/07/2011    Lab 10/07/11 0938 10/07/11 0923  NA 143 --  K 4.8 --  CL 115* --  CO2 -- 18*  BUN 34* --  CREATININE 3.20* --  CALCIUM -- 9.1  PROT -- 6.7  BILITOT -- 0.3  ALKPHOS -- 95  ALT -- 9  AST -- 12  GLUCOSE 96 --       Lab Results  Component Value Date   CHOL 148 10/08/2011   CHOL 159 09/18/2011   Lab Results  Component Value Date   HDL 33* 10/08/2011   HDL 44.30 09/18/2011   Lab Results  Component Value Date   LDLCALC 93 10/08/2011   LDLCALC 97 09/18/2011   Lab Results  Component Value Date   TRIG 111 10/08/2011   TRIG 90.0 09/18/2011   Lab Results  Component Value Date   CHOLHDL 4.5 10/08/2011   CHOLHDL 4 09/18/2011      2-D Echocardiogram: EF 40% with moderate LVH and grade 1 diastolic dysfunction. No significant valvular abnormalities and no TR Doppler jet still unable to estimate PA systolic pressure.   Radiology:  MRI : IMPRESSION: 1. Acute patchy infarcts in the left ACA territory. No mass effect or hemorrhage. 2. Acute-on-chronic patchy right PCA territory infarcts. Chest x-ray no acute disease      EKG:  Sinus bradycardia with LVH and repolarization abnormality  ASSESSMENT AND PLAN:   The patient was seen today by Peter Martinique, MD, the patient evaluated and the data reviewed.  He has severe sinus bradycardia with PACs. He should be continued on telemetry but no atrial fibrillation has been seen at this time. We will continue to follow him for this. We will also schedule a TEE for further evaluation.  Signed: Rosaria Ferries 10/08/2011, 12:25 PM Co-Sign MD

## 2011-10-08 NOTE — Progress Notes (Signed)
Speech Language/Pathology Speech Language Pathology Evaluation Patient Details Name: Trevor [name] MRN: [id number] DOB: [birth year] Today's Date: 10/08/2011  Problem List:  Patient Active Problem List  Diagnoses  . Chest pain  . Tobacco use disorder  . Hypertension  . Bruit  . Cardiomyopathy  . Renal insufficiency  . Stroke  . Cocaine abuse  . Severe sinus bradycardia  . Acute renal failure  . Chronic kidney disease (CKD), stage III (moderate)  . A-fib    Past Medical History:  Past Medical History  Diagnosis Date  . HTN (hypertension)   . Anxiety   . Cardiomyopathy   . Renal insufficiency    Past Surgical History: History reviewed. No pertinent past surgical history.  SLP Assessment/Plan/Recommendation Assessment Clinical Impression Statement: pt presents with a mild expressive and receptive aphasia characterized by fluent output with appropriate grammatical form, but delayed response and initiation time; decreased spontaneity of verbal output; dysnomia, particularly when asked to engage in unpredictable conversation;  difficulty executing two step commands and comprehending complex  verbal instructions.  Decreased insight into deficits.   SLP Recommendation/Assessment: Patient will need skilled Speech Lanaguage Pathology Services in the acute care venue to address identified deficits Problem List: Auditory comprehension;Verbal expression Therapy Diagnosis: Aphasia Plan Speech Therapy Frequency: min 2x/week Duration: 2 weeks Treatment/Interventions: Language facilitation;SLP instruction and feedback;Patient/family education Potential to Achieve Goals: Good Recommendation Equipment Recommended: Defer to next venue Individuals Consulted Consulted and Agree with Results and Recommendations: Patient  Pt passed RN stroke swallow screen; hence, formal SLP swallow eval is not warranted.  Juan Quam Laurice 10/08/2011, 11:49 AM

## 2011-10-08 NOTE — Progress Notes (Signed)
*  PRELIMINARY RESULTS*  Carotid Dopplers has been performed. Preliminary - No significant ICA stenosis bilaterally. Vertebral arteries are patent with antegrade flow.  Rich Brave 10/08/2011, 1:22 PM

## 2011-10-08 NOTE — Progress Notes (Signed)
Physical Therapy Evaluation Patient Details Name: Trevor [name] MRN: [id number] DOB: 02-02-1946 Today's Date: 10/08/2011  Problem List:  Patient Active Problem List  Diagnoses  . Chest pain  . Tobacco use disorder  . Hypertension  . Bruit  . Cardiomyopathy  . Renal insufficiency  . Stroke  . Cocaine abuse  . Severe sinus bradycardia  . Acute renal failure  . Chronic kidney disease (CKD), stage III (moderate)    Past Medical History:  Past Medical History  Diagnosis Date  . HTN (hypertension)   . Anxiety   . Cardiomyopathy   . Renal insufficiency    Past Surgical History: History reviewed. No pertinent past surgical history.  PT Assessment/Plan/Recommendation PT Assessment Clinical Impression Statement: Pt is a 65 y/o male admitted with right sided weakness s/p fall and the below problem list.  Pt would benefit from PT acutely to maximize independence, balance, and safety while facilitating d/c to [name] PT Recommendation/Assessment: Patient will need skilled PT in the acute care venue PT Problem List: Decreased strength;Decreased balance;Decreased mobility;Decreased cognition Barriers to Discharge: Decreased caregiver support Barriers to Discharge Comments: Will need 24 hour assist to safely d/c home.  Thus, recommend [name]. PT Therapy Diagnosis : Hemiplegia dominant side PT Plan PT Frequency: Min 4X/week PT Treatment/Interventions: DME instruction;Gait training;Functional mobility training;Balance training;Neuromuscular re-education;Cognitive remediation;Patient/family education PT Recommendation Follow Up Recommendations: Skilled nursing facility;24 hour supervision/assistance Equipment Recommended: Defer to next venue PT Goals  Acute Rehab PT Goals PT Goal Formulation: With patient Time For Goal Achievement: 2 weeks Pt will go Supine/Side to Sit: with supervision PT Goal: Supine/Side to Sit - Progress: Other (comment) (Set today) Pt will Transfer Sit to Stand/Stand  to Sit: with supervision PT Transfer Goal: Sit to Stand/Stand to Sit - Progress: Other (comment) (Set today) Pt will Transfer Bed to Chair/Chair to Bed: with supervision PT Transfer Goal: Bed to Chair/Chair to Bed - Progress: Other (comment) (Set today) Pt will Ambulate: 51 - 150 feet;with min assist;with least restrictive assistive device PT Goal: Ambulate - Progress: Other (comment) (Set today)  PT Evaluation Precautions/Restrictions  Precautions Precautions: Fall Required Braces or Orthoses: No Restrictions Weight Bearing Restrictions: No Prior Functioning  Home Living Lives With: Alone Receives Help From: Other (Comment) (None) Type of Home: Apartment Home Layout: One level Home Access: Elevator Home Adaptive Equipment: Straight cane Prior Function Level of Independence: Independent with basic ADLs;Independent with homemaking with ambulation;Independent with transfers;Requires assistive device for independence Able to Take Stairs?: Yes Driving: No Cognition Cognition Arousal/Alertness: Awake/alert Overall Cognitive Status: Impaired Orientation Level: Oriented X4 Problem Solving: Able to problem solve independently for tasks assessed;Other (comment) (Slow to process and problem solve.) Sensation/Coordination Sensation Light Touch: Impaired Detail (Tingling right LE.) Light Touch Impaired Details: Impaired RLE (Tingling) Stereognosis: Not tested Hot/Cold: Not tested Proprioception: Not tested Coordination Gross Motor Movements are Fluid and Coordinated: No Coordination and Movement Description: Right LE decreased strength to 2+/5.  Left LE 5/5. Extremity Assessment RUE Assessment RUE Assessment: Not tested LUE Assessment LUE Assessment: Not tested RLE Assessment RLE Assessment: Exceptions to [facility] RLE Strength RLE Overall Strength: Deficits;Other (Comment) (2+/5 throughout) LLE Assessment LLE Assessment: Within Functional Limits Mobility (including Balance) Bed  Mobility Bed Mobility: Yes Supine to Sit: 3: Mod assist;HOB flat Supine to Sit Details (indicate cue type and reason): Assist to right LE to facilitate motion off EOB and to trunk to translate anterior. Cues for sequence Transfers Transfers: Yes Sit to Stand: 4: Min assist;From bed;Other (comment) (2 trials) Sit to Stand Details (  indicate cue type and reason): Assist to block right LE to prevent buckling with facilitation at trunk for balance and extension. Stand to Sit: 4: Min assist;To bed;To chair/3-in-1;Other (comment) (2 trials (1 to bed, 1 to chair)) Stand to Sit Details: Assist to block right LE to prevent buckling and control eccentric flexion of knee.  Assist at trunk for balance.  Cues for sequence and hand placement. Stand Pivot Transfers: 3: Mod assist Stand Pivot Transfer Details (indicate cue type and reason): Assist to facilitate rotation through pelvis with cues for sequence with blocking to right LE to prevent buckling. Ambulation/Gait Ambulation/Gait: No Stairs: No Wheelchair Mobility Wheelchair Mobility: No  Posture/Postural Control Posture/Postural Control: No significant limitations Balance Balance Assessed: No Exercise    End of Session PT - End of Session Equipment Utilized During Treatment: Gait belt Activity Tolerance: Patient tolerated treatment well Patient left: in chair;with call bell in reach Nurse Communication: Mobility status for transfers;Other (comment) (Right shoulder guarded with cracking noise.  MD/RN aware) General Behavior During Session: [facility] for tasks performed Cognition: [facility] for tasks performed  Kendal Hymen M 10/08/2011, 10:28 AM

## 2011-10-09 ENCOUNTER — Ambulatory Visit (HOSPITAL_COMMUNITY): Admit: 2011-10-09 | Payer: Self-pay | Admitting: Cardiology

## 2011-10-09 ENCOUNTER — Encounter (HOSPITAL_COMMUNITY): Payer: Self-pay | Admitting: *Deleted

## 2011-10-09 ENCOUNTER — Encounter (HOSPITAL_COMMUNITY)
Admission: EM | Disposition: A | Discharge status: Skilled Nursing Facility | Payer: Self-pay | Source: Home / Self Care | Attending: Internal Medicine

## 2011-10-09 HISTORY — PX: TEE WITHOUT CARDIOVERSION: SHX5443

## 2011-10-09 LAB — CBC
HCT: 30.3 % — ABNORMAL LOW (ref 39.0–52.0)
MCH: 28 pg (ref 26.0–34.0)
MCV: 81.5 fL (ref 78.0–100.0)
Platelets: 178 10*3/uL (ref 150–400)
RBC: 3.72 MIL/uL — ABNORMAL LOW (ref 4.22–5.81)
WBC: 7.1 10*3/uL (ref 4.0–10.5)

## 2011-10-09 LAB — IRON AND TIBC
Iron: 67 ug/dL (ref 42–135)
TIBC: 249 ug/dL (ref 215–435)
UIBC: 182 ug/dL (ref 125–400)

## 2011-10-09 LAB — BASIC METABOLIC PANEL
CO2: 18 mEq/L — ABNORMAL LOW (ref 19–32)
Calcium: 8.7 mg/dL (ref 8.4–10.5)
Chloride: 112 mEq/L (ref 96–112)
Creatinine, Ser: 2.84 mg/dL — ABNORMAL HIGH (ref 0.50–1.35)
Glucose, Bld: 84 mg/dL (ref 70–99)
Sodium: 138 mEq/L (ref 135–145)

## 2011-10-09 LAB — FERRITIN: Ferritin: 263 ng/mL (ref 22–322)

## 2011-10-09 LAB — FOLATE: Folate: 6.8 ng/mL

## 2011-10-09 SURGERY — ECHOCARDIOGRAM, TRANSESOPHAGEAL
Anesthesia: Moderate Sedation

## 2011-10-09 MED ORDER — SODIUM CHLORIDE 0.9 % IV SOLN: 250.0000 mL | INTRAVENOUS | Status: DC

## 2011-10-09 MED ORDER — FENTANYL CITRATE 0.05 MG/ML IJ SOLN
INTRAMUSCULAR | Status: DC | PRN
  Administered 2011-10-09: 25 ug via INTRAVENOUS

## 2011-10-09 MED ORDER — FENTANYL CITRATE 0.05 MG/ML IJ SOLN
INTRAMUSCULAR | Status: AC
  Filled 2011-10-09: qty 2

## 2011-10-09 MED ORDER — MIDAZOLAM HCL 10 MG/2ML IJ SOLN
INTRAMUSCULAR | Status: AC
  Filled 2011-10-09: qty 2

## 2011-10-09 MED ORDER — MIDAZOLAM HCL 10 MG/2ML IJ SOLN
INTRAMUSCULAR | Status: DC | PRN
  Administered 2011-10-09 (×2): 2 mg via INTRAVENOUS

## 2011-10-09 MED ORDER — BUTAMBEN-TETRACAINE-BENZOCAINE 2-2-14 % EX AERO
INHALATION_SPRAY | CUTANEOUS | Status: DC | PRN
  Administered 2011-10-09: 2 via TOPICAL

## 2011-10-09 MED ORDER — ISOSORBIDE MONONITRATE ER 30 MG PO TB24
30.0000 mg | ORAL_TABLET | Freq: Every day | ORAL | Status: DC
  Administered 2011-10-09 – 2011-10-10 (×2): 30 mg via ORAL
  Filled 2011-10-09 (×2): qty 1

## 2011-10-09 NOTE — Progress Notes (Signed)
Occupational Therapy Evaluation Patient Details Name: Trevor [name] MRN: [id number] DOB: 12-13-1945 Today's Date: 10/09/2011  Problem List:  Patient Active Problem List  Diagnoses  . Chest pain  . Tobacco use disorder  . Hypertension  . Bruit  . Cardiomyopathy  . Renal insufficiency  . Stroke  . Cocaine abuse  . Severe sinus bradycardia  . Acute renal failure  . Chronic kidney disease (CKD), stage III (moderate)  . A-fib    Past Medical History:  Past Medical History  Diagnosis Date  . HTN (hypertension)   . Anxiety   . Cardiomyopathy   . Renal insufficiency    Past Surgical History: History reviewed. No pertinent past surgical history.  OT Assessment/Plan/Recommendation OT Assessment Clinical Impression Statement: Pt. will benefit from acute OT to increase functional independence with ADLs and decrease burden of care at next venue of care OT Recommendation/Assessment: Patient will need skilled OT in the acute care venue OT Problem List: Decreased strength;Decreased activity tolerance;Decreased cognition;Decreased coordination;Impaired vision/perception;Impaired balance (sitting and/or standing);Decreased knowledge of use of DME or AE;Decreased knowledge of precautions;Impaired sensation;Impaired UE functional use;Decreased safety awareness Barriers to Discharge: Decreased caregiver support OT Therapy Diagnosis : Generalized weakness;Disturbance of vision;Cognitive deficits;Hemiplegia dominant side OT Plan OT Frequency: Min 1X/week OT Treatment/Interventions: Self-care/ADL training;Neuromuscular education;DME and/or AE instruction;Therapeutic activities;Cognitive remediation/compensation;Visual/perceptual remediation/compensation;Patient/family education;Balance training OT Recommendation Follow Up Recommendations: Skilled nursing facility Equipment Recommended: Defer to next venue Individuals Consulted Consulted and Agree with Results and Recommendations: Patient OT  Goals Acute Rehab OT Goals OT Goal Formulation: With patient Time For Goal Achievement: 2 weeks ADL Goals Pt Will Perform Grooming: with set-up;Standing at sink;with supervision ADL Goal: Grooming - Progress: Other (comment) Pt Will Perform Upper Body Bathing: with set-up;Sitting, chair ADL Goal: Upper Body Bathing - Progress: Other (comment) Pt Will Perform Lower Body Bathing: with min assist;Sit to stand from bed ADL Goal: Lower Body Bathing - Progress: Other (comment) Pt Will Perform Upper Body Dressing: with set-up;Sitting, chair ADL Goal: Upper Body Dressing - Progress: Other (comment) Pt Will Perform Lower Body Dressing: with min assist;Sit to stand from bed ADL Goal: Lower Body Dressing - Progress: Other (comment) Pt Will Transfer to Toilet: with min assist;3-in-1 ADL Goal: Toilet Transfer - Progress: Other (comment) Pt Will Perform Toileting - Hygiene: with set-up;Sit to stand from 3-in-1/toilet ADL Goal: Toileting - Hygiene - Progress: Other (comment)  OT Evaluation Precautions/Restrictions  Precautions Precautions: Fall Required Braces or Orthoses: No Restrictions Weight Bearing Restrictions: No Prior Functioning   Prior Function Level of Independence: Independent with basic ADLs;Independent with homemaking with ambulation;Independent with gait;Independent with transfers Able to Take Stairs?: Yes Driving: No ADL ADL Eating/Feeding: NPO Grooming: Wash/dry face;Performed;Set up Where Assessed - Grooming: Supine, head of bed up Upper Body Bathing: Simulated;Chest;Right arm;Left arm;Abdomen;Moderate assistance Where Assessed - Upper Body Bathing: Sitting, bed Lower Body Bathing: Simulated;Maximal assistance Where Assessed - Lower Body Bathing: Sit to stand from bed Upper Body Dressing: Performed;Minimal assistance Upper Body Dressing Details (indicate cue type and reason): With donning gown and education on donning rt sleeve first due to rt side weakness Where  Assessed - Upper Body Dressing: Sitting, bed Lower Body Dressing: Simulated;Maximal assistance Where Assessed - Lower Body Dressing: Sit to stand from bed Toilet Transfer: Not assessed Toileting - Clothing Manipulation: Not assessed Toileting - Hygiene: Not assessed Where Assessed - Toileting Hygiene: Not assessed Tub/Shower Transfer: Not assessed Tub/Shower Transfer Method: Not assessed Equipment Used: Rolling walker ADL Comments: Pt. with rt side weakness and decreased fine motor coordination  of rt hand and requiring increased assist Vision/Perception  Vision - History Baseline Vision: Wears glasses all the time Visual History: Cataracts;Other (comment) (Per pt. rt eye legally blind) Patient Visual Report: Blurring of vision Vision - Assessment Eye Alignment: Within Functional Limits Vision Assessment: Vision not tested Additional Comments: Pt. not following commands when attempting to assess vision. Pt. able to track to both sides to determine time on clock on rt side of pt. within 5 mins of accuracy of the current time. Pt. reports blurriness is associated with not having his glasses here due to cataracts. Perception Perception: Within Functional Limits Praxis Praxis: Impaired Cognition Cognition Arousal/Alertness: Awake/alert Overall Cognitive Status: Impaired Orientation Level: Oriented X4 Following Commands: Follows multi-step commands inconsistently Safety/Judgement: Decreased awareness of safety precautions;Decreased safety judgement for tasks assessed Decreased Safety/Judgement: Decreased awareness of need for assistance Problem Solving: Able to problem solve independently for tasks assessed;Other (comment) Sensation/Coordination Sensation Light Touch: Appears Intact Light Touch Impaired Details: Impaired RLE Stereognosis: Not tested Hot/Cold: Not tested Proprioception: Not tested Additional Comments: Pt. does not report any changes in sensation with rt arm however  does report tingling of Rt LE Coordination Gross Motor Movements are Fluid and Coordinated: No Fine Motor Movements are Fluid and Coordinated: No Coordination and Movement Description: Rt UE 3/5 and fine motor coordination not intact. pt. with increased time to bring each finger tip to thumb and educated on practicing to improve the coordination Extremity Assessment RUE Assessment RUE Assessment: Exceptions to [place] Overall Strength: Deficits (Pt with 3/5 strength and decreased [place]) LUE Assessment LUE Assessment: Within Functional Limits Mobility  Bed Mobility Bed Mobility: Yes Supine to Sit: HOB flat;3: Mod assist Supine to Sit Details (indicate cue type and reason): Assist to trunk to translate anterior and for right LE secondary to weakness.  Increased time for problem solving and initiation. Verbal cues for sequence. Transfers Transfers: Yes Sit to Stand: 1: +2 Total assist;Patient percentage (comment);Other (comment);From bed;From chair/3-in-1 (+2 total assist (80%); 2 trials (1 from bed, 1 from chair)) Sit to Stand Details (indicate cue type and reason): Assist to translate trunk anterior over BOS with max cues for safest hand placement.  Facilitation to trunk for extension. Stand to Sit: 1: +2 Total assist;Patient percentage (comment);To chair/3-in-1;To bed;Other (comment) (+2 total assist (pt=80%); 2 trials (1 to chair, 1 to bed)) Stand to Sit Details: Assist to slow descent with manual facilitation at right knee for eccentric quadriceps control and max cues for safest hand placement. End of Session OT - End of Session Equipment Utilized During Treatment: Gait belt Activity Tolerance: Patient limited by fatigue Patient left: in bed;with call bell in reach Nurse Communication: Mobility status for transfers General Behavior During Session: Other (comment) (Lethargic, but easily engaged with session) Cognition: Impaired (Slow to process information and difficulty  problem solving)   Co-tx with P.T.   Salma Walrond,OTR/L 10/09/2011, 8:30 AM

## 2011-10-09 NOTE — Brief Op Note (Signed)
See echo report Kirk Ruths

## 2011-10-09 NOTE — H&P (Signed)
See H and P Kirk Ruths

## 2011-10-09 NOTE — Progress Notes (Signed)
Physical Therapy Treatment Patient Details Name: Trevor [name] MRN: [id number] DOB: Jun 09, 1946 Today's Date: 10/09/2011  PT Assessment/Plan  PT - Assessment/Plan Comments on Treatment Session: Co-treatment with Occupational Therapy.  See OT note for details and goals not included in PT note. PT Plan: Discharge plan remains appropriate;Frequency remains appropriate PT Frequency: Min 4X/week Follow Up Recommendations: Skilled nursing facility;24 hour supervision/assistance Equipment Recommended: Defer to next venue PT Goals  Acute Rehab PT Goals PT Goal: Supine/Side to Sit - Progress: Progressing toward goal PT Transfer Goal: Sit to Stand/Stand to Sit - Progress: Progressing toward goal PT Transfer Goal: Bed to Chair/Chair to Bed - Progress: Progressing toward goal PT Goal: Ambulate - Progress: Progressing toward goal  PT Treatment Precautions/Restrictions  Precautions Precautions: Fall Required Braces or Orthoses: No Restrictions Weight Bearing Restrictions: No Mobility (including Balance) Bed Mobility Bed Mobility: Yes Supine to Sit: HOB flat;3: Mod assist Supine to Sit Details (indicate cue type and reason): Assist to trunk to translate anterior and for right LE secondary to weakness.  Increased time for problem solving and initiation. Verbal cues for sequence. Transfers Transfers: Yes Sit to Stand: 1: +2 Total assist;Patient percentage (comment);Other (comment);From bed;From chair/3-in-1 (+2 total assist (80%); 2 trials (1 from bed, 1 from chair)) Sit to Stand Details (indicate cue type and reason): Assist to translate trunk anterior over BOS with max cues for safest hand placement.  Facilitation to trunk for extension. Stand to Sit: 1: +2 Total assist;Patient percentage (comment);To chair/3-in-1;To bed;Other (comment) (+2 total assist (pt=80%); 2 trials (1 to chair, 1 to bed)) Stand to Sit Details: Assist to slow descent with manual facilitation at right knee for eccentric  quadriceps control and max cues for safest hand placement. Stand Pivot Transfers: Not tested (comment) Ambulation/Gait Ambulation/Gait: Yes Ambulation/Gait Assistance: 1: +2 Total assist;Patient percentage (comment) (+2 total assist (pt=65%)) Ambulation/Gait Assistance Details (indicate cue type and reason): Assist at trunk for balance, right LE to advance secondary to decreased hip and ankle flexion, shift weight right to advance left LE, and for RW steering.  Max cues for sequencing. Ambulation Distance (Feet): 25 Feet Assistive device: Rolling walker Gait Pattern: Decreased step length - right;Decreased stance time - right;Decreased hip/knee flexion - right;Decreased dorsiflexion - right;Decreased weight shift to right Stairs: No Wheelchair Mobility Wheelchair Mobility: No  Posture/Postural Control Posture/Postural Control: No significant limitations Balance Balance Assessed: No Exercise    End of Session PT - End of Session Equipment Utilized During Treatment: Gait belt Activity Tolerance: Patient tolerated treatment well Patient left: in bed;with call bell in reach Nurse Communication: Mobility status for ambulation;Mobility status for transfers General Behavior During Session: Other (comment) (Lethargic, but easily engaged with session) Cognition: Impaired (Slow to process information and difficulty problem solving)  Santana Edell M 10/09/2011, 8:25 AM

## 2011-10-09 NOTE — OR Nursing (Signed)
Vs did not flow to epic. Posted in chart

## 2011-10-09 NOTE — Progress Notes (Signed)
Subjective: He has no complaints. He has worked with PT and feels like he had trouble with balance. He is scheduled for a TEE today. PT has recommended [name] and I have discussed this with him. He is in agreement with going to [name] to receive Rehab.   Objective: Weight change:   Intake/Output Summary (Last 24 hours) at 10/09/11 1514 Last data filed at 10/09/11 1400  Gross per 24 hour  Intake 2277.5 ml  Output   1800 ml  Net  477.5 ml   Blood pressure 167/56, pulse 60, temperature 97.9 F (36.6 C), temperature source Oral, resp. rate 16, height 6\' 1"  (1.854 m), weight 63.6 kg (140 lb 3.4 oz), SpO2 100.00%. Temp:  [97.4 F (36.3 C)-98.4 F (36.9 C)] 97.9 F (36.6 C) (11/08 1200) Pulse Rate:  [47-98] 60  (11/08 1200) Resp:  [14-19] 16  (11/08 0800) BP: (138-193)/(48-81) 167/56 mmHg (11/08 1200) SpO2:  [99 %-100 %] 100 % (11/08 1200)  Physical Exam: General: No acute respiratory distress Lungs: Clear to auscultation bilaterally without wheezes or crackles Cardiovascular: Bradycardia without appreciable gallop rub or murmur Abdomen: Nontender, nondistended, soft, bowel sounds positive, no rebound, no ascites, no appreciable mass Extremities: No significant cyanosis, clubbing, or edema bilateral lower extremities Neurologic: I agree with the exam as per the stroke team-please see their note  Lab Results:  [facility] 10/09/11 0510 10/07/11 0938 10/07/11 0923  NA 138 143 138  K 4.6 4.8 4.7  CL 112 115* 112  CO2 18* -- 18*  GLUCOSE 84 96 93  BUN 36* 34* 31*  CREATININE 2.84* 3.20* 3.07*  CALCIUM 8.7 -- 9.1  MG -- -- --  PHOS -- -- --    Basename 10/07/11 0923  AST 12  ALT 9  ALKPHOS 95  BILITOT 0.3  PROT 6.7  ALBUMIN 2.9*   No results found for this basename: LIPASE:2,AMYLASE:2 in the last 72 hours  Basename 10/09/11 0510 10/07/11 0938 10/07/11 0923  WBC 7.1 -- 7.0  NEUTROABS -- -- 3.8  HGB 10.4* 11.2* 11.1*  HCT 30.3* 33.0* 32.0*  MCV 81.5 -- 81.8  PLT 178 -- 193    No results found for this basename: CKTOTAL:3,CKMB:3,CKMBINDEX:3,TROPONINI:3 in the last 72 hours No results found for this basename: POCBNP:3 in the last 72 hours No results found for this basename: DDIMER:2 in the last 72 hours  Basename 10/08/11 0530  HGBA1C 5.4    Basename 10/08/11 0530  CHOL 148  HDL 33*  LDLCALC 93  TRIG 111  CHOLHDL 4.5  LDLDIRECT --   No results found for this basename: TSH,T4TOTAL,FREET3,T3FREE,THYROIDAB in the last 72 hours  Basename 10/09/11 0510  VITAMINB12 333  FOLATE 6.8  FERRITIN 263  TIBC 249  IRON 67  RETICCTPCT 1.5    Micro Results: Recent Results (from the past 240 hour(s))  MRSA PCR SCREENING     Status: Normal   Collection Time   10/07/11  8:04 PM      Component Value Range Status Comment   MRSA by PCR NEGATIVE  NEGATIVE  Final     Studies/Results: Scheduled Meds:    . amLODipine  10 mg Oral Daily  . aspirin  325 mg Oral Daily  . heparin  5,000 Units Subcutaneous Q8H  . hydrALAZINE  25 mg Oral Q8H   Continuous Infusions:    . sodium chloride 75 mL/hr at 10/09/11 1400   PRN Meds:.acetaminophen, acetaminophen, hydrALAZINE, nitroGLYCERIN, ondansetron (ZOFRAN) IV  Assessment/Plan:  Stroke MRI reveals evidence of cerebral strokes- a  TEE is indicated given the bilateral nature of this patient's acute strokes.For now we will continue aspirin therapy. If his TEE suggests a cardiac source of embolus we will need to consider full anticoagulation. We will plan on d/c to [name] when he is medically stable.   Cardiomyopathy As noted above a TEE is to be performed. There is no evidence of severe volume overload or an acute exacerbation of heart failure.  Cocaine abuse The patient has been counseled the absolute need to discontinue cocaine abuse. We will ask the social worker continue to educate him on the opportunities available in the community to assist him in doing so.  Sinus bradycardia The majority the patient's telemetry  at this point is indicated a sinus bradycardia. He has been evaluated by Dr [place] today. Hydralazine and Nitrates recommended for his systolic CHF. Coreg has been discontinued.   Acute renal failure with Chronic kidney disease (CKD), stage III (moderate) This is improving w/ hydration.  The patient's baseline renal function appears to be 2.5-2.8. I suspect that his current exacerbation is due to a combination of dehydration as well as poor perfusion related to severe bradycardia. We'll assure that the patient is well hydrated and we will follow his renal function.  HTN- On Norvasc at home. Hydralazine started in hospital. Can add Imdur today.      LOS: 2 days   Trevor [name] 10/09/2011, 3:14 PM

## 2011-10-09 NOTE — Progress Notes (Signed)
SUBJECTIVE: The patient is doing OK today.  He is presently ambulating slowly with PT.   No overnight events   OBJECTIVE: Physical Exam: Filed Vitals:   10/09/11 0300 10/09/11 0350 10/09/11 0400 10/09/11 0640  BP: 151/56 183/68 171/74 193/81  Pulse: 48 75 67   Temp:  98.3 F (36.8 C)    TempSrc:  Oral    Resp: 15 19 18    Height:      Weight:      SpO2: 99% 100% 100%     Intake/Output Summary (Last 24 hours) at 10/09/11 0801 Last data filed at 10/09/11 0600  Gross per 24 hour  Intake 1677.5 ml  Output   1750 ml  Net  -72.5 ml    Telemetry reveals sinus rhythm, no afib  GEN- The patient is elderly appearing, alert and oriented x 3 today.   Presently ambulating very slowly with PT,  fulll exam therefore not performed  LABS: Basic Metabolic Panel:  Basename 10/09/11 0510 10/07/11 0938 10/07/11 0923  NA 138 143 --  K 4.6 4.8 --  CL 112 115* --  CO2 18* -- 18*  GLUCOSE 84 96 --  BUN 36* 34* --  CREATININE 2.84* 3.20* --  CALCIUM 8.7 -- 9.1  MG -- -- --  PHOS -- -- --   Liver Function Tests:  Basename 10/07/11 0923  AST 12  ALT 9  ALKPHOS 95  BILITOT 0.3  PROT 6.7  ALBUMIN 2.9*   No results found for this basename: LIPASE:2,AMYLASE:2 in the last 72 hours CBC:  Basename 10/09/11 0510 10/07/11 0938 10/07/11 0923  WBC 7.1 -- 7.0  NEUTROABS -- -- 3.8  HGB 10.4* 11.2* --  HCT 30.3* 33.0* --  MCV 81.5 -- 81.8  PLT 178 -- 193   Cardiac Enzymes: No results found for this basename: CKTOTAL:3,CKMB:3,CKMBINDEX:3,TROPONINI:3 in the last 72 hours BNP: No results found for this basename: POCBNP:3 in the last 72 hours D-Dimer: No results found for this basename: DDIMER:2 in the last 72 hours Hemoglobin A1C:  Basename 10/08/11 0530  HGBA1C 5.4   Fasting Lipid Panel:  Basename 10/08/11 0530  CHOL 148  HDL 33*  LDLCALC 93  TRIG 111  CHOLHDL 4.5  LDLDIRECT --   Thyroid Function Tests: No results found for this basename:  TSH,T4TOTAL,FREET3,T3FREE,THYROIDAB in the last 72 hours Anemia Panel:  Basename 10/09/11 0510  VITAMINB12 --  FOLATE --  FERRITIN --  TIBC --  IRON --  RETICCTPCT 1.5    RADIOLOGY: Ct Head Wo Contrast  10/07/2011  *RADIOLOGY REPORT*  Clinical Data: Code stroke, right-sided weakness, a fascia  CT HEAD WITHOUT CONTRAST  Technique:  Contiguous axial images were obtained from the base of the skull through the vertex without contrast.  Comparison: None.  Findings: The ventricular system is within normal limits in size for age and only mild cortical atrophy is present.  There is low attenuation within the right occipital lobe consistent with old infarct.  Mild small vessel ischemic change is present.  No hemorrhage, mass lesion, or acute infarction is seen.  On bone window images, no bony abnormality is seen.  IMPRESSION:  1.  No acute intracranial abnormality. 2.  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 3.  Mild atrophy and small vessel disease.  Critical Value/emergent results were called by telephone at the time of interpretation on 10/07/2011  at 9:25 a.m.  to  Dr. Audie Pinto, who verbally acknowledged these results.  Original Report Authenticated By: Joretta Bachelor, M.D.   Mr  Brain Wo Contrast  10/08/2011  *RADIOLOGY REPORT*  Clinical Data: 65 year old male with right-sided weakness, memory loss, slurred speech, possible stroke.  MRI HEAD WITHOUT CONTRAST  Technique:  Multiplanar, multiecho pulse sequences of the brain and surrounding structures were obtained according to standard protocol without intravenous contrast.  Comparison: CT head without contrast 10/07/2011.  Findings: There is cortical and subcortical scattered restricted diffusion in the left ACA territory affecting the left cingulate gyrus and left superior frontal gyrus (series 11 images 20-26). Associated T2 and FLAIR hyperintensity no mass effect or associated hemorrhage.  Separately there is acute on chronic ischemia in the right PCA territory,  occipital lobe, and some involvement of the right aspect of the splenium (series 11 image 17).  Associated T2 and FLAIR hyperintensities superimposed on chronic encephalomalacia.  No acute hemorrhage at this site.  Decreased flow void in the distal most left vertebral artery suggesting stenosis.  Other major intracranial vascular flow voids are preserved.  No ventriculomegaly.  No significant mass effect.  No other intracranial mass lesion.  No extra-axial collection.  Negative pituitary, cervicomedullary junction and visualized cervical spine.  In addition to the acute and chronic right PCA findings there is scattered T2 and FLAIR hyperintensity in the cerebral white matter and brainstem.  Cerebellum is within normal limits.  There is moderate to patchy T2 hyperintensity in the deep gray matter nuclei.  Mild paranasal sinus mucosal thickening.  Mastoids are clear. Visualized orbit soft tissues are within normal limits.  Visualized bone marrow signal is within normal limits.  Visualized scalp soft tissues are within normal limits.  IMPRESSION: 1.  Acute patchy infarcts in the left ACA territory.  No mass effect or hemorrhage. 2.  Acute-on-chronic patchy right PCA territory infarcts. 3.  Age advanced signal changes in the white matter and deep gray matter nuclei, favor chronic small vessel disease. 4.  Intracranial MRA findings are below.  MRA HEAD WITHOUT CONTRAST  Technique:  Angiographic images of the Circle of Willis were obtained using MRA technique without intravenous contrast.  Comparison:  None.  Findings:  Antegrade flow in the posterior circulation. Irregularity in the distal left vertebral artery proximal to the left PICA origin which is patent.  The left vertebral beyond PICA is without significant stenosis.  Relatively dominant distal right vertebral artery is normal with normal right PICA.  Mild to moderate basilar artery irregularity with mild non hemodynamically significant mid basilar stenosis.   Superior cerebellar artery and PCA origins are patent.  There is moderate irregularity and focal stenosis in the right P1 segment.  There is moderate irregularity and focal stenosis in the distal left P2 segment.  Preserved distal PCA flow bilaterally.  Antegrade flow in both ICA siphons with moderate to severe cavernous ICA irregularity.  No significant stenosis with respect to the distal ICA vessel caliber.  Hypoplastic right ICA terminus owing to hypoplastic right ACA A1 segment.  Right MCA origin is normal.  Mild to moderate right MCA branch irregularity with no branch stenosis or occlusion.  Left ICA terminus is patent with dominant left ACA.  Anterior communicating artery is normal.  There is a high-grade stenosis in the right ACA A2 segment just beyond its origin.  Otherwise only mild bilateral ACA branch irregularity is noted, no left ACA branch stenosis or occlusion is identified on the included images.  Mild left MCA M1 segment irregularity.  Visualized left MCA branches are within normal limits.  IMPRESSION: 1.  Widespread intracranial large and medium-sized vessel atherosclerosis. 2.  No left ACA proximal stenosis or occlusion.  There is a moderate right PCA P1 segment stenosis with relatively preserved distal flow.  Original Report Authenticated By: Randall An, M.D.   Dg Chest Portable 1 View  10/07/2011  *RADIOLOGY REPORT*  Clinical Data: Recent fall, altered mental status  PORTABLE CHEST - 1 VIEW  Comparison: Chest x-ray of 08/07/2011  Findings: The lungs are clear.  The heart is mildly enlarged and stable.  No bony abnormality is seen.  IMPRESSION: Stable mild cardiomegaly.  No active lung disease.  Original Report Authenticated By: Joretta Bachelor, M.D.    ASSESSMENT AND PLAN:   1. S/p CVA,  No afib on telemetry,  Would likely benefit from event monitor on discharge,  TEE planned for later today to evaluate for embolic source 2. Bradycardia- stable and asymptomatic,  No further workup  planned at this point  IF TEE OK, no further CV inpatient eval planned,  Will need PT/OT.       Thompson Grayer, MD 10/09/2011 8:01 AM

## 2011-10-09 NOTE — Progress Notes (Signed)
Stroke Team Progress Note Subjective: [name] is a 65 year old male that presents with difficulty with speech and right-sided numbness and weakness. MRI has shown a patchy left frontal embolic infarct..he has shown slight improvement overnight he is able to speak but still has some mild expressive difficulties. His right lower extremity remains weak her right arm strength has improved.he is awaiting transesophageal echocardiogram later today. He has no new complaints. He has been assessed by physical and occupational therapy and they have recommended skilled nursing placement as patient lives alone and he cannot go to inpatient rehabilitation  Objective: Vital signs: Blood pressure 168/56, pulse 75, temperature 97.4 F (36.3 C), temperature source Oral, resp. rate 16, height 6\' 1"  (1.854 m), weight 63.6 kg (140 lb 3.4 oz), SpO2 100.00%.  Intake/Output from previous day: 11/07 0701 - 11/08 0700 In: 1677.5 [I.V.:1677.5] Out: 1750 [Urine:1750]   IV Intake:    . sodium chloride 75 mL/hr at 10/08/11 2350    Diet: NPO for TEE Activity: Up with assistance  DVT Prophylaxis:  Sq heparin  Physical Exam:  Alert. Speaks speaks but is nonfluent. There is definite word hesitancy. He is able to name and repeat well. He has good comprehension. He is able to tell me on the names of 5 animals in 1 minute.Cranial Nerves:  II- Visual acuity intact. Dense left homonymous hemianopsia  III/IV/VI-Extraocular movements intact. Pupils reactive bilaterally.  V/VII-Decrease in right NLF.  VIII-grossly intact  IX/X-decreased gag  XI-decreased right shoulder shrug  XII-midline tongue extension  Motor: Increased tone on the right. RUE strength at 4/5, 2/5 RLE strength. 5/5 strength on the left. Mild right upper extremity drift present. Diminished fine finger movements on the right. Orbits left over right upper extremity. Right grip is weak.  Sensory: Pinprick and light touch decreased on the right  Deep  Tendon Reflexes: 2+ and symmetric throughout with absent AJ's bilaterally  Plantars: Downgoing on the left and upgoing on the right  Cerebellar: Normal finger-to-nose and heel-to-shin testing not performed.  Studies: Results for orders placed during the hospital encounter of 10/07/11 (from the past 24 hour(s))  RETICULOCYTES     Status: Abnormal   Collection Time   10/09/11  5:10 AM      Component Value Range   Retic Ct Pct 1.5  0.4 - 3.1 (%)   RBC. 3.72 (*) 4.22 - 5.81 (MIL/uL)   Retic Count, Manual 55.8  19.0 - 186.0 (K/uL)  BASIC METABOLIC PANEL     Status: Abnormal   Collection Time   10/09/11  5:10 AM      Component Value Range   Sodium 138  135 - 145 (mEq/L)   Potassium 4.6  3.5 - 5.1 (mEq/L)   Chloride 112  96 - 112 (mEq/L)   CO2 18 (*) 19 - 32 (mEq/L)   Glucose, Bld 84  70 - 99 (mg/dL)   BUN 36 (*) 6 - 23 (mg/dL)   Creatinine, Ser 2.84 (*) 0.50 - 1.35 (mg/dL)   Calcium 8.7  8.4 - 10.5 (mg/dL)   GFR calc non Af Amer 22 (*) >90 (mL/min)   GFR calc Af Amer 25 (*) >90 (mL/min)  CBC     Status: Abnormal   Collection Time   10/09/11  5:10 AM      Component Value Range   WBC 7.1  4.0 - 10.5 (K/uL)   RBC 3.72 (*) 4.22 - 5.81 (MIL/uL)   Hemoglobin 10.4 (*) 13.0 - 17.0 (g/dL)   HCT 30.3 (*) 39.0 -  52.0 (%)   MCV 81.5  78.0 - 100.0 (fL)   MCH 28.0  26.0 - 34.0 (pg)   MCHC 34.3  30.0 - 36.0 (g/dL)   RDW 13.7  11.5 - 15.5 (%)   Platelets 178  150 - 400 (K/uL)    2D: EF 40% no SOE CD:  No significant ICA stenosis demonstrated bilaterally. There is a moderate increase in the left ECA.Vertebrals are patent with antegrade flow.  Assessment: Trevor [name] is an 65 y.o. year old male with a bilateral embolic infarcts etiology to be determined. 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  . Hospital day # 2.  Treatment/Plan: TEE today. Therapy rec [name] as pt lived alone pta.continue present treatment plan.  BIBY,SHARON AVNP, ANP-BC, GNP-BC,  [facility] 11/8/201210:30 AM

## 2011-10-09 NOTE — Procedures (Signed)
See results in echo system Kirk Ruths

## 2011-10-10 DIAGNOSIS — I5043 Acute on chronic combined systolic (congestive) and diastolic (congestive) heart failure: Secondary | ICD-10-CM

## 2011-10-10 LAB — BASIC METABOLIC PANEL
BUN: 33 mg/dL — ABNORMAL HIGH (ref 6–23)
CO2: 18 mEq/L — ABNORMAL LOW (ref 19–32)
Calcium: 8.9 mg/dL (ref 8.4–10.5)
Glucose, Bld: 77 mg/dL (ref 70–99)
Sodium: 142 mEq/L (ref 135–145)

## 2011-10-10 MED ORDER — INFLUENZA VAC TYP A&B SURF ANT IM INJ: 0.5000 mL | INJECTION | INTRAMUSCULAR | Status: DC

## 2011-10-10 MED ORDER — ASPIRIN 325 MG PO TABS: 325.0000 mg | ORAL_TABLET | Freq: Every day | ORAL | Status: DC

## 2011-10-10 MED ORDER — ISOSORBIDE MONONITRATE ER 30 MG PO TB24: 30.0000 mg | ORAL_TABLET | Freq: Every day | ORAL | Status: DC

## 2011-10-10 MED ORDER — HYDRALAZINE HCL 25 MG PO TABS: 25.0000 mg | ORAL_TABLET | Freq: Three times a day (TID) | ORAL | Status: DC

## 2011-10-10 NOTE — Progress Notes (Signed)
Subjective: He has no complaints. TEE was negative for a thrombus.  Objective: Weight change:   Intake/Output Summary (Last 24 hours) at 10/10/11 1321 Last data filed at 10/10/11 1214  Gross per 24 hour  Intake   1965 ml  Output   1050 ml  Net    915 ml   Blood pressure 171/59, pulse 58, temperature 98.1 F (36.7 C), temperature source Oral, resp. rate 19, height 5\' 10"  (1.778 m), weight 63.504 kg (140 lb), SpO2 98.00%. Temp:  [98 F (36.7 C)-98.7 F (37.1 C)] 98.1 F (36.7 C) (11/09 1200) Pulse Rate:  [42-58] 58  (11/09 1200) Resp:  [5-22] 19  (11/09 1200) BP: (148-191)/(59-90) 171/59 mmHg (11/09 1200) SpO2:  [98 %-100 %] 98 % (11/09 1200) Weight:  [63.504 kg (140 lb)] 140 lb (63.504 kg) (11/08 1518)  Physical Exam: General: No acute respiratory distress Lungs: Clear to auscultation bilaterally without wheezes or crackles Cardiovascular: Bradycardia without appreciable gallop rub or murmur Abdomen: Nontender, nondistended, soft, bowel sounds positive, no rebound, no ascites, no appreciable mass Extremities: No significant cyanosis, clubbing, or edema bilateral lower extremities Neurologic: I agree with the exam as per the stroke team-please see their note  Lab Results:  [facility] 10/10/11 0357 10/09/11 0510  NA 142 138  K 4.6 4.6  CL 114* 112  CO2 18* 18*  GLUCOSE 77 84  BUN 33* 36*  CREATININE 2.70* 2.84*  CALCIUM 8.9 8.7  MG -- --  PHOS -- --   No results found for this basename: AST:2,ALT:2,ALKPHOS:2,BILITOT:2,PROT:2,ALBUMIN:2 in the last 72 hours No results found for this basename: LIPASE:2,AMYLASE:2 in the last 72 hours  Basename 10/09/11 0510  WBC 7.1  NEUTROABS --  HGB 10.4*  HCT 30.3*  MCV 81.5  PLT 178   No results found for this basename: CKTOTAL:3,CKMB:3,CKMBINDEX:3,TROPONINI:3 in the last 72 hours No results found for this basename: POCBNP:3 in the last 72 hours No results found for this basename: DDIMER:2 in the last 72 hours  Basename  10/08/11 0530  HGBA1C 5.4    Basename 10/08/11 0530  CHOL 148  HDL 33*  LDLCALC 93  TRIG 111  CHOLHDL 4.5  LDLDIRECT --   No results found for this basename: TSH,T4TOTAL,FREET3,T3FREE,THYROIDAB in the last 72 hours  Basename 10/09/11 0510  VITAMINB12 333  FOLATE 6.8  FERRITIN 263  TIBC 249  IRON 67  RETICCTPCT 1.5    Micro Results: Recent Results (from the past 240 hour(s))  MRSA PCR SCREENING     Status: Normal   Collection Time   10/07/11  8:04 PM      Component Value Range Status Comment   MRSA by PCR NEGATIVE  NEGATIVE  Final     Studies/Results: Scheduled Meds:    . amLODipine  10 mg Oral Daily  . aspirin  325 mg Oral Daily  . heparin  5,000 Units Subcutaneous Q8H  . hydrALAZINE  25 mg Oral Q8H  . influenza (>/= 3 years) inactive virus vaccine  0.5 mL Intramuscular Tomorrow-1000  . isosorbide mononitrate  30 mg Oral Daily   Continuous Infusions:    . sodium chloride 75 mL/hr at 10/10/11 1200  . DISCONTD: sodium chloride     PRN Meds:.acetaminophen, acetaminophen, hydrALAZINE, nitroGLYCERIN, ondansetron (ZOFRAN) IV, DISCONTD: butamben-tetracaine-benzocaine, DISCONTD: fentaNYL, DISCONTD: midazolam  Assessment/Plan:  Stroke with right sided numbness and weakness and expressive aphasia- MRI reveals evidence of bilateral cerebral strokes- a TEE was performed yesterday and was negative. Will cont ASA. We will plan on d/c to [name]  when a bed is available.   Cardiomyopathy As noted above a TEE is nagative. There is no evidence of severe volume overload or an acute exacerbation of heart failure.  Cocaine abuse The patient has been counseled the absolute need to discontinue cocaine abuse. We will ask the social worker continue to educate him on the opportunities available in the community to assist him in doing so.  Sinus bradycardia The majority the patient's telemetry at this point is indicated a sinus bradycardia. 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 Coreg has been discontinued.   Acute renal failure with Chronic kidney disease (CKD), stage III (moderate) This is improving w/ hydration.  The patient's baseline renal function appears to be 2.5-2.8. I suspect that his current exacerbation is due to a combination of dehydration as well as poor perfusion related to severe bradycardia. Will continue slow hydration while monitoring respiratory status.   HTN- On Norvasc at home. Hydralazine started in hospital.  Added Imdur today.   Transfer to telemetry today.     LOS: 3 days   [place] 10/10/2011, 1:21 PM

## 2011-10-10 NOTE — Progress Notes (Signed)
Physical Therapy Treatment Patient Details Name: Trevor [name] MRN: [id number] DOB: [birth year] Today's Date: 10/10/2011  PT Assessment/Plan  PT - Assessment/Plan PT Plan: Discharge plan remains appropriate;Frequency remains appropriate PT Frequency: Min 4X/week Follow Up Recommendations: Skilled nursing facility;24 hour supervision/assistance Equipment Recommended: Defer to next venue PT Goals  Acute Rehab PT Goals PT Goal Formulation: With patient Time For Goal Achievement: 2 weeks Pt will go Supine/Side to Sit: with supervision PT Goal: Supine/Side to Sit - Progress: Progressing toward goal Pt will Transfer Sit to Stand/Stand to Sit: with supervision PT Transfer Goal: Sit to Stand/Stand to Sit - Progress: Progressing toward goal Pt will Transfer Bed to Chair/Chair to Bed: with supervision PT Transfer Goal: Bed to Chair/Chair to Bed - Progress: Progressing toward goal Pt will Ambulate: 51 - 150 feet;with min assist;with least restrictive assistive device PT Goal: Ambulate - Progress: Progressing toward goal  PT Treatment Precautions/Restrictions  Precautions Precautions: Fall Required Braces or Orthoses: No Restrictions Weight Bearing Restrictions: No Mobility (including Balance) Bed Mobility Bed Mobility: Yes Supine to Sit: 4: Min assist;HOB flat Supine to Sit Details (indicate cue type and reason): Assist for right LE to facilitate movement off of EOB. Transfers Transfers: Yes Sit to Stand: 4: Min assist;From bed;With upper extremity assist Sit to Stand Details (indicate cue type and reason): Assist for balance with max cues for safest hand placement. Stand to Sit: 4: Min assist;To chair/3-in-1;With armrests;With upper extremity assist Stand to Sit Details: Assist to slow descent with max verbal and tactile cues for safest hand placement. Stand Pivot Transfers: Not tested (comment) Ambulation/Gait Ambulation/Gait: Yes Ambulation/Gait Assistance: 3: Mod  assist Ambulation/Gait Assistance Details (indicate cue type and reason): Assist to balance and to advance right LE as well as shift weight right in order to allow for left LE swing. Ambulation Distance (Feet): 35 Feet Assistive device: Rolling walker Gait Pattern: Decreased step length - right;Decreased stance time - right;Decreased hip/knee flexion - right Stairs: No Wheelchair Mobility Wheelchair Mobility: No  Posture/Postural Control Posture/Postural Control: No significant limitations Balance Balance Assessed: No Exercise    End of Session PT - End of Session Equipment Utilized During Treatment: Gait belt Activity Tolerance: Patient tolerated treatment well Patient left: with call bell in reach;in chair Nurse Communication: Mobility status for ambulation;Mobility status for transfers General Behavior During Session: [name] for tasks performed Cognition: Impaired (Slow to process information)  Cyndia Bent 10/10/2011, 10:24 AM  10/10/2011 Cyndia Bent, PT, DPT [phone]

## 2011-10-10 NOTE — Procedures (Signed)
[facility] [address] [place], [place] [address] [id number]   Patient: Trevor [name], Trevor [name]   MR Number: [id number]   Study Date: 10/09/2011   Transesophageal Echocardiography  LV EF: 45% 50% Indications: CVA 436.  Study Conclusions  Impressions:  Study data: Study status: Routine. Consent: The risks, benefits, and alternatives to the procedure were explained to the patient and informed consent was obtained. Procedure: Initial setup. Surface ECG leads were monitored. Sedation. Conscious sedation was administered by cardiology staff. Transesophageal echocardiography. An adult multiplane transesophageal probe was inserted by the attending cardiologist without difficulty. Image quality was adequate. Study completion: The patient tolerated the procedure well. There were no complications. Transesophageal echocardiography. 2D and color Doppler. Patient status: Inpatient. Location: Endoscopy.  Gender: M Age: 65 DOB: [birth year] Height: Weight: BSA: Pt. Status: Room: [facility] ORDERING Kirk Ruths, MD, [facility] PERFORMING Kirk Ruths, MD, [facility] SONOGRAPHER Tresa Res, RDCS ADMITTING MagickMyers, Iskra ATTENDING MagickMyers, Iskra cc:  -------------------------------------------------------------------------------------- ? Left ventricle: There was moderate concentric hypertrophy. Systolic function was mildly reduced. The estimated ejection fraction was in the range of 45% to 50%. Diffuse hypokinesis.  ? Left atrium: The atrium was mildly dilated. No evidence of thrombus in the atrial cavity or appendage.  ? Atrial septum: No defect or patent foramen ovale was identified.  ? Negative saline microcavitation study.  Tricuspid valve: Structurally normal valve. Leaflet separation was normal. Doppler: Trivial regurgitation.  Right atrium: The atrium was normal in size.  Pericardium: There was no pericardial  effusion. ---------------------------------------------------------------------  Left ventricle: There was moderate concentric hypertrophy. Systolic function was mildly reduced. The estimated ejection fraction was in the range of 45% to 50%. Diffuse hypokinesis.  Aortic valve: Trileaflet. Doppler: No regurgitation.  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  Mitral valve: Structurally normal valve. Leaflet separation was normal. Doppler: Trivial regurgitation.  Left atrium: The atrium was mildly dilated. No evidence of thrombus in the atrial cavity or appendage.  Atrial septum: No defect or patent foramen ovale was identified.  Right ventricle: The cavity size was normal. Systolic function was normal.  Pulmonic valve: Doppler: No significant regurgitation.  Tricuspid valve: Structurally normal valve. Leaflet separation was normal. Doppler: Trivial regurgitation.  Right atrium: The atrium was normal in size.  Pericardium: There was no pericardial effusion.   Prepared and Electronically Authenticated by Kirk Ruths, MD, [facility] [id number] T07: 07:[id number]   Prepared and Electronically Authenticated by Kirk Ruths, MD, [facility] [id number] T07: [id number]

## 2011-10-10 NOTE — Progress Notes (Addendum)
STROKE TEAM PROGRESS NOTE SUBJECTIVE No complaints.he has no complaints today. His urine drug screen was positive for cocaine. He admits to cocaine abuse and I counseled him and he is willing to quit. He had transesophageal echocardiogram performed  yesterday by Dr. Stanford Breed but I have been unable to locate the results  OBJECTIVE Vital signs: Temp: 98.7 F (37.1 C) (11/09 0400) Temp src: Oral (11/09 0400) BP: 160/62 mmHg (11/09 0800) Pulse Rate: 51  (11/09 0800) Respiratory Rate: 13  Intake/Output from previous day: 11/08 0701 - 11/09 0700 In: 2040 [P.O.:240; I.V.:1800] Out: 1450 [Urine:1450]   IV Fluid Intake:    . sodium chloride 75 mL/hr at 10/09/11 1900  . DISCONTD: sodium chloride      Diet: Cardiac, thin liquids  Activity: Up with assistance  DVT Prophylaxis:  Sq heparin  Studies: CBC    Component Value Date/Time   WBC 7.1 10/09/2011 0510   RBC 3.72* 10/09/2011 0510   HGB 10.4* 10/09/2011 0510   HCT 30.3* 10/09/2011 0510   PLT 178 10/09/2011 0510   MCV 81.5 10/09/2011 0510   MCH 28.0 10/09/2011 0510   MCHC 34.3 10/09/2011 0510   RDW 13.7 10/09/2011 0510   LYMPHSABS 1.9 10/07/2011 0923   MONOABS 0.6 10/07/2011 0923   EOSABS 0.6 10/07/2011 0923   BASOSABS 0.1 10/07/2011 0923   CMP     Component Value Date/Time   NA 142 10/10/2011 0357   K 4.6 10/10/2011 0357   CL 114* 10/10/2011 0357   CO2 18* 10/10/2011 0357   GLUCOSE 77 10/10/2011 0357   BUN 33* 10/10/2011 0357   CREATININE 2.70* 10/10/2011 0357   CALCIUM 8.9 10/10/2011 0357   PROT 6.7 10/07/2011 0923   ALBUMIN 2.9* 10/07/2011 0923   AST 12 10/07/2011 0923   ALT 9 10/07/2011 0923   ALKPHOS 95 10/07/2011 0923   BILITOT 0.3 10/07/2011 0923   GFRNONAA 23* 10/10/2011 0357   GFRAA 27* 10/10/2011 0357    Lipid Panel     Component Value Date/Time   CHOL 148 10/08/2011 0530   TRIG 111 10/08/2011 0530   HDL 33* 10/08/2011 0530   CHOLHDL 4.5 10/08/2011 0530   VLDL 22 10/08/2011 0530   LDLCALC 93 10/08/2011 0530   hgba1C      Lab Results  Component Value Date   HGBA1C 5.4 10/08/2011    TEE:    Physical Exam:  Alert. Speaks speaks but is nonfluent. There is definite word hesitancy. He is able to name and repeat well. He has good comprehension. He is able to tell me on the names of 5 animals in 1 minute.Cranial Nerves:  II- Visual acuity intact. Dense left homonymous hemianopsia  III/IV/VI-Extraocular movements intact. Pupils reactive bilaterally.  V/VII-Decrease in right NLF.  VIII-grossly intact  IX/X-decreased gag  XI-decreased right shoulder shrug  XII-midline tongue extension  Motor: Increased tone on the right. RUE strength at 4/5, 2/5 RLE strength. 5/5 strength on the left. Mild right upper extremity drift present. Diminished fine finger movements on the right. Orbits left over right upper extremity. Right grip is weak.  Sensory: Pinprick and light touch decreased on the right  Deep Tendon Reflexes: 2+ and symmetric throughout with absent AJ's bilaterally  Plantars: Downgoing on the left and upgoing on the right  Cerebellar: Normal finger-to-nose and heel-to-shin testing not performed.   ASSESSMENT Patient Active Problem List  Diagnoses  . Chest pain  . Tobacco use disorder  . Hypertension  . Bruit  . Cardiomyopathy  . Renal  insufficiency  . Stroke  . Cocaine abuse  . Severe sinus bradycardia  . Acute renal failure  . Chronic kidney disease (CKD), stage III (moderate)  . A-fib   Trevor [name] is an 65 y.o. year old male with a bilateral embolic infarcts etiology to be determined. 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 Avoid cocaine.Disposition to skilled nursing facility when bed available.  Steffanie Rainwater,  ANP-BC, GNP-BC [facility] Pager: [phone] 10/10/2011 8:15 AM  TEE shows no clot or PFO. EF is 45-50%.No Cardiac source of embolism. Stroke Team will sign off. Call for questions.

## 2011-10-10 NOTE — Progress Notes (Signed)
CSW facilitated patient dc including contacting patient family and facility and arranging ambulance transport to [facility].  Patient gave permission to RN and CSW to contact family and provide information.  No further SW needs at this time.  CSW signing off.  [place], [place]

## 2011-10-10 NOTE — Discharge Planning (Signed)
DISCHARGE SUMMARY  Trevor [name]  MR#: [id number]  DOB:[birth year]  Date of Admission: 10/07/2011 Date of Discharge: 10/10/2011  Attending Physician:Kerisha Goughnour Rowe Pavy  Patient's PCP:No primary provider on file.  Consults: [facility]- neurology Trevor [name]- [facility]  Presenting Complaint: Brought to hospital for a fall  Discharge Diagnoses:  1. 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 Cardiomyopathy 3. 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 Sinus bradycardia 5. 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 HTN 7. Cocaine abuse    Discharge Medications: Current Discharge Medication List    START taking these medications   Details  aspirin 325 MG tablet Take 1 tablet (325 mg total) by mouth daily.    hydrALAZINE (APRESOLINE) 25 MG tablet Take 1 tablet (25 mg total) by mouth every 8 (eight) hours.    isosorbide mononitrate (IMDUR) 30 MG 24 hr tablet Take 1 tablet (30 mg total) by mouth daily.      CONTINUE these medications which have NOT CHANGED   Details  amLODipine (NORVASC) 10 MG tablet Take 1 tablet (10 mg total) by mouth daily. Qty: 30 tablet, Refills: 11   Associated Diagnoses: Precordial pain; Bruit; Pre-operative cardiovascular examination    nitroGLYCERIN (NITROSTAT) 0.4 MG SL tablet Place 0.4 mg under the tongue every 5 (five) minutes as needed. For chest pain       STOP taking these medications     aspirin EC 81 MG tablet      carvedilol (COREG) 6.25 MG tablet          Procedures:  Ct Head Wo Contrast  10/07/2011  *RADIOLOGY REPORT*  Clinical Data: Code stroke, right-sided weakness, a fascia  CT HEAD WITHOUT CONTRAST  Technique:  Contiguous axial images were obtained from the base of the skull through the vertex without contrast.  Comparison: None.  Findings: The ventricular system is within normal limits in size for age and only mild cortical atrophy is present.   There is low attenuation within the right occipital lobe consistent with old infarct.  Mild small vessel ischemic change is present.  No hemorrhage, mass lesion, or acute infarction is seen.  On bone window images, no bony abnormality is seen.  IMPRESSION:  1.  No acute intracranial abnormality. 2.  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 3.  Mild atrophy and small vessel disease.  Critical Value/emergent results were called by telephone at the time of interpretation on 10/07/2011  at 9:25 a.m.  to  Dr. Audie Pinto, who verbally acknowledged these results.  Original Report Authenticated By: Joretta Bachelor, M.D.   Mr Brain Wo Contrast  10/08/2011  *RADIOLOGY REPORT*  Clinical Data: 65 year old male with right-sided weakness, memory loss, slurred speech, possible stroke.  MRI HEAD WITHOUT CONTRAST  Technique:  Multiplanar, multiecho pulse sequences of the brain and surrounding structures were obtained according to standard protocol without intravenous contrast.  Comparison: CT head without contrast 10/07/2011.  Findings: There is cortical and subcortical scattered restricted diffusion in the left ACA territory affecting the left cingulate gyrus and left superior frontal gyrus (series 11 images 20-26). Associated T2 and FLAIR hyperintensity no mass effect or associated hemorrhage.  Separately there is acute on chronic ischemia in the right PCA territory, occipital lobe, and some involvement of the right aspect of the splenium (series 11 image 17).  Associated T2 and FLAIR hyperintensities superimposed on chronic encephalomalacia.  No acute hemorrhage at this site.  Decreased flow void in the distal most left vertebral  artery suggesting stenosis.  Other major intracranial vascular flow voids are preserved.  No ventriculomegaly.  No significant mass effect.  No other intracranial mass lesion.  No extra-axial collection.  Negative pituitary, cervicomedullary junction and visualized cervical spine.  In addition to the acute and  chronic right PCA findings there is scattered T2 and FLAIR hyperintensity in the cerebral white matter and brainstem.  Cerebellum is within normal limits.  There is moderate to patchy T2 hyperintensity in the deep gray matter nuclei.  Mild paranasal sinus mucosal thickening.  Mastoids are clear. Visualized orbit soft tissues are within normal limits.  Visualized bone marrow signal is within normal limits.  Visualized scalp soft tissues are within normal limits.  IMPRESSION: 1.  Acute patchy infarcts in the left ACA territory.  No mass effect or hemorrhage. 2.  Acute-on-chronic patchy right PCA territory infarcts. 3.  Age advanced signal changes in the white matter and deep gray matter nuclei, favor chronic small vessel disease. 4.  Intracranial MRA findings are below.  MRA HEAD WITHOUT CONTRAST  Technique:  Angiographic images of the Circle of Willis were obtained using MRA technique without intravenous contrast.  Comparison:  None.  Findings:  Antegrade flow in the posterior circulation. Irregularity in the distal left vertebral artery proximal to the left PICA origin which is patent.  The left vertebral beyond PICA is without significant stenosis.  Relatively dominant distal right vertebral artery is normal with normal right PICA.  Mild to moderate basilar artery irregularity with mild non hemodynamically significant mid basilar stenosis.  Superior cerebellar artery and PCA origins are patent.  There is moderate irregularity and focal stenosis in the right P1 segment.  There is moderate irregularity and focal stenosis in the distal left P2 segment.  Preserved distal PCA flow bilaterally.  Antegrade flow in both ICA siphons with moderate to severe cavernous ICA irregularity.  No significant stenosis with respect to the distal ICA vessel caliber.  Hypoplastic right ICA terminus owing to hypoplastic right ACA A1 segment.  Right MCA origin is normal.  Mild to moderate right MCA branch irregularity with no branch  stenosis or occlusion.  Left ICA terminus is patent with dominant left ACA.  Anterior communicating artery is normal.  There is a high-grade stenosis in the right ACA A2 segment just beyond its origin.  Otherwise only mild bilateral ACA branch irregularity is noted, no left ACA branch stenosis or occlusion is identified on the included images.  Mild left MCA M1 segment irregularity.  Visualized left MCA branches are within normal limits.  IMPRESSION: 1.  Widespread intracranial large and medium-sized vessel atherosclerosis. 2.  No left ACA proximal stenosis or occlusion.  There is a moderate right PCA P1 segment stenosis with relatively preserved distal flow.  Original Report Authenticated By: Randall An, M.D.   Dg Chest Portable 1 View  10/07/2011  *RADIOLOGY REPORT*  Clinical Data: Recent fall, altered mental status  PORTABLE CHEST - 1 VIEW  Comparison: Chest x-ray of 08/07/2011  Findings: The lungs are clear.  The heart is mildly enlarged and stable.  No bony abnormality is seen.  IMPRESSION: Stable mild cardiomegaly.  No active lung disease.  Original Report Authenticated By: Joretta Bachelor, M.D.   Carotid Dopplers   Preliminary - No significant ICA stenosis bilaterally. Vertebral arteries are patent with antegrade flow.  Rich Brave  10/08/2011, 1:22 PM   2 D ECHO Study Conclusions  - Left ventricle: The cavity size was normal. Wall thickness was increased in  a pattern of moderate LVH. Systolic function was mildly to moderately reduced. The estimated ejection fraction was 40%. Diffuse hypokinesis. Doppler parameters are consistent with abnormal left ventricular relaxation (grade 1 diastolic dysfunction). - Aortic valve: There was no stenosis. - Mitral valve: No significant regurgitation. - Left atrium: The atrium was mildly dilated. - Right ventricle: The cavity size was normal. Systolic function was normal. - Pulmonary arteries: No complete TR doppler jet so unable to  estimate PA systolic pressure. - Inferior vena cava: The vessel was normal in size; the respirophasic diameter changes were in the normal range (= 50%); findings are consistent with normal central venous pressure. Impressions:  - Normal LV size with moderate LV hypertrophy. Mild to moderate global hypokinesis with estimated EF 40%. EF estimation was made more difficult because the patient was profoundly bradycardic during this study (HR in the 30s). No significant valvular abnormalities. Normal RV size and systolic function.    Hospital Course: 1. 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 TEE was negative for an embolic source but aorta was noted to have severe atherosclerosis. An event monitor on discharge was recommended by Dr.  Rayann Heman and will be arranged. He will need to follow up with cardiology for the results. The patient is being discharge to a rehab facility.   2.Sinus bradycardia- His heart rate on admission was 30-40. He was noted to be on Coreg which was discontinued.  Heart rate has improved to 50-60 range.    3. 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 Cardiomyopathy  5. ARF on CKD III- suspected to be due to dehydration and poor perfusion from his bradycardia. It has improved w/ hydration and he is back to his baseline which is about 2.5-2.8.   6. HTN- Coreg was discontinued as mentioned above. Hydralazine and Imdur have been added. Norvasc has been continued.   7. Cocaine abuse- he was councilled regarding this.     Day of Discharge BP 179/73  Pulse 56  Temp(Src) 98.1 F (36.7 C) (Oral)  Resp 19  Ht 5\' 10"  (1.778 m)  Wt 63.504 kg (140 lb)  BMI 20.09 kg/m2  SpO2 99%  Physical Exam: Lungs: Clear to auscultation bilaterally without wheezes or crackles  Cardiovascular: Bradycardia without appreciable  gallop rub or murmur  Abdomen: Nontender, nondistended, soft, bowel sounds positive, no rebound, no ascites, no appreciable mass  Extremities: No significant cyanosis, clubbing, or edema bilateral lower extremities Neuro: Cranial Nerves:  II- Visual acuity intact. Dense left homonymous hemianopsia  III/IV/VI-Extraocular movements intact. Pupils reactive bilaterally.  V/VII-Decrease in right NLF.  VIII-grossly intact  IX/X-decreased gag  XI-decreased right shoulder shrug  XII-midline tongue extension  Motor: Increased tone on the right. RUE strength at 4/5, 2/5 RLE strength. 5/5 strength on the left. Mild right upper extremity drift present. Diminished fine finger movements on the right. Orbits left over right upper extremity. Right grip is weak.  Sensory: Pinprick and light touch decreased on the right  Deep Tendon Reflexes: 2+ and symmetric throughout with absent AJ's bilaterally  Plantars: Downgoing on the left and upgoing on the right  Cerebellar: Normal finger-to-nose and heel-to-shin testing not performed.   Results for orders placed during the hospital encounter of 10/07/11 (from the past 24 hour(s))  BASIC METABOLIC PANEL     Status: Abnormal   Collection Time   10/10/11  3:57 AM  Component Value Range   Sodium 142  135 - 145 (mEq/L)   Potassium 4.6  3.5 - 5.1 (mEq/L)   Chloride 114 (*) 96 - 112 (mEq/L)   CO2 18 (*) 19 - 32 (mEq/L)   Glucose, Bld 77  70 - 99 (mg/dL)   BUN 33 (*) 6 - 23 (mg/dL)   Creatinine, Ser 2.70 (*) 0.50 - 1.35 (mg/dL)   Calcium 8.9  8.4 - 10.5 (mg/dL)   GFR calc non Af Amer 23 (*) >90 (mL/min)   GFR calc Af Amer 27 (*) >90 (mL/min)    Disposition: stable   Follow-up Appts: Discharge Orders    Future Appointments: Provider: Department: Dept Phone: Center:   10/20/2011 11:00 AM [id number]  None   10/20/2011 12:00 PM Lelon Perla, MD [place] [phone] [id number]     Future Orders Please Complete By Expires    Diet - low sodium heart healthy      Walk with assistance      Increase activity slowly         Follow-up with [facility] in 2  weeks.   Tests Needing Follow-up: Event Monitor  Signed: Wonda Cerise 10/10/2011, 2:33 PM

## 2011-10-14 ENCOUNTER — Telehealth: Payer: Self-pay

## 2011-10-15 NOTE — ED Provider Notes (Signed)
Medical screening examination/treatment/procedure(s) were performed by non-physician practitioner and as supervising physician I was immediately available for consultation/collaboration.    Dot Lanes, MD 10/15/11 [phone]

## 2011-10-20 ENCOUNTER — Ambulatory Visit: Payer: Medicare Other | Admitting: Cardiology

## 2011-10-20 ENCOUNTER — Encounter: Payer: Medicare Other | Admitting: Cardiology

## 2011-10-24 ENCOUNTER — Encounter (HOSPITAL_COMMUNITY): Payer: Self-pay | Admitting: Cardiology

## 2011-10-28 ENCOUNTER — Telehealth: Payer: Self-pay | Admitting: Cardiology

## 2011-10-28 ENCOUNTER — Encounter: Payer: Self-pay | Admitting: Cardiology

## 2011-10-28 NOTE — Telephone Encounter (Signed)
Spoke with Marianna Fuss from home health, medication discussed  Trevor [name]

## 2011-10-28 NOTE — Telephone Encounter (Signed)
New Msg: Marianna Fuss calling needing to speak to nurse regarding pt medications.

## 2011-10-30 ENCOUNTER — Telehealth: Payer: Self-pay | Admitting: Cardiology

## 2011-10-30 NOTE — Telephone Encounter (Signed)
Pt b/p is 200/94 pulse if 50 and pt has not taken meds yet and Stanton Kidney just wanted to a [place]

## 2011-10-30 NOTE — Telephone Encounter (Signed)
Dr Stanford Breed made aware Fredia Beets

## 2011-10-31 ENCOUNTER — Telehealth: Payer: Self-pay | Admitting: Cardiology

## 2011-10-31 NOTE — Telephone Encounter (Signed)
New Msg: Bridgette calling wanting to get verbal order for pt to get rolling walker and bedside commode. Please return call to discuss further.

## 2011-10-31 NOTE — Telephone Encounter (Signed)
Spoke with bridgette, to contact primary provider Fredia Beets

## 2011-11-05 NOTE — Discharge Summary (Signed)
DISCHARGE SUMMARY  JREAM MCQUERRY  MR#: [id number]  DOB:[birth year]  Date of Admission: 10/07/2011  Date of Discharge: 10/10/2011  Attending Physician:Derrico Zhong Rowe Pavy  Patient's PCP:No primary provider on file.  Consults:  [place]- neurology  [facility]  Presenting Complaint:  Brought to hospital for a fall  Discharge Diagnoses:  1. 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 Cardiomyopathy  3. 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 Sinus bradycardia  5. ARF on CKD III  6. HTN  7. Cocaine abuse  Discharge Medications:  Current Discharge Medication List     START taking these medications    Details   aspirin 325 MG tablet  Take 1 tablet (325 mg total) by mouth daily.     hydrALAZINE (APRESOLINE) 25 MG tablet  Take 1 tablet (25 mg total) by mouth every 8 (eight) hours.     isosorbide mononitrate (IMDUR) 30 MG 24 hr tablet  Take 1 tablet (30 mg total) by mouth daily.       CONTINUE these medications which have NOT CHANGED    Details   amLODipine (NORVASC) 10 MG tablet  Take 1 tablet (10 mg total) by mouth daily.  Qty: 30 tablet, Refills: 11    Associated Diagnoses: Precordial pain; Bruit; Pre-operative cardiovascular examination     nitroGLYCERIN (NITROSTAT) 0.4 MG SL tablet  Place 0.4 mg under the tongue every 5 (five) minutes as needed. For chest pain       STOP taking these medications      aspirin EC 81 MG tablet       carvedilol (COREG) 6.25 MG tablet        Procedures:  Ct Head Wo Contrast  10/07/2011 *RADIOLOGY REPORT* Clinical Data: Code stroke, right-sided weakness, a fascia CT HEAD WITHOUT CONTRAST Technique: Contiguous axial images were obtained from the base of the skull through the vertex without contrast. Comparison: None. Findings: The ventricular system is within normal limits in size for age and only mild cortical atrophy is present.  There is low attenuation within the right occipital lobe consistent with old infarct. Mild small vessel ischemic change is present. No hemorrhage, mass lesion, or acute infarction is seen. On bone window images, no bony abnormality is seen. IMPRESSION: 1. No acute intracranial abnormality. 2. 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 3. Mild atrophy and small vessel disease. Critical Value/emergent results were called by telephone at the time of interpretation on 10/07/2011 at 9:25 a.m. to Dr. Audie Pinto, who verbally acknowledged these results. Original Report Authenticated By: Joretta Bachelor, M.D.  Mr Brain Wo Contrast  10/08/2011 *RADIOLOGY REPORT* Clinical Data: 65 year old male with right-sided weakness, memory loss, slurred speech, possible stroke. MRI HEAD WITHOUT CONTRAST Technique: Multiplanar, multiecho pulse sequences of the brain and surrounding structures were obtained according to standard protocol without intravenous contrast. Comparison: CT head without contrast 10/07/2011. Findings: There is cortical and subcortical scattered restricted diffusion in the left ACA territory affecting the left cingulate gyrus and left superior frontal gyrus (series 11 images 20-26). Associated T2 and FLAIR hyperintensity no mass effect or associated hemorrhage. Separately there is acute on chronic ischemia in the right PCA territory, occipital lobe, and some involvement of the right aspect of the splenium (series 11 image 17). Associated T2 and FLAIR hyperintensities superimposed on chronic encephalomalacia. No acute hemorrhage at this site. Decreased flow void in the distal most left vertebral artery suggesting stenosis. Other major intracranial vascular flow voids  are preserved. No ventriculomegaly. No significant mass effect. No other intracranial mass lesion. No extra-axial collection. Negative pituitary, cervicomedullary junction and visualized cervical spine. In addition to the acute and chronic right PCA findings there is  scattered T2 and FLAIR hyperintensity in the cerebral white matter and brainstem. Cerebellum is within normal limits. There is moderate to patchy T2 hyperintensity in the deep gray matter nuclei. Mild paranasal sinus mucosal thickening. Mastoids are clear. Visualized orbit soft tissues are within normal limits. Visualized bone marrow signal is within normal limits. Visualized scalp soft tissues are within normal limits. IMPRESSION: 1. Acute patchy infarcts in the left ACA territory. No mass effect or hemorrhage. 2. Acute-on-chronic patchy right PCA territory infarcts. 3. Age advanced signal changes in the white matter and deep gray matter nuclei, favor chronic small vessel disease. 4. Intracranial MRA findings are below. MRA HEAD WITHOUT CONTRAST Technique: Angiographic images of the Circle of Willis were obtained using MRA technique without intravenous contrast. Comparison: None. Findings: Antegrade flow in the posterior circulation. Irregularity in the distal left vertebral artery proximal to the left PICA origin which is patent. The left vertebral beyond PICA is without significant stenosis. Relatively dominant distal right vertebral artery is normal with normal right PICA. Mild to moderate basilar artery irregularity with mild non hemodynamically significant mid basilar stenosis. Superior cerebellar artery and PCA origins are patent. There is moderate irregularity and focal stenosis in the right P1 segment. There is moderate irregularity and focal stenosis in the distal left P2 segment. Preserved distal PCA flow bilaterally. Antegrade flow in both ICA siphons with moderate to severe cavernous ICA irregularity. No significant stenosis with respect to the distal ICA vessel caliber. Hypoplastic right ICA terminus owing to hypoplastic right ACA A1 segment. Right MCA origin is normal. Mild to moderate right MCA branch irregularity with no branch stenosis or occlusion. Left ICA terminus is patent with dominant left  ACA. Anterior communicating artery is normal. There is a high-grade stenosis in the right ACA A2 segment just beyond its origin. Otherwise only mild bilateral ACA branch irregularity is noted, no left ACA branch stenosis or occlusion is identified on the included images. Mild left MCA M1 segment irregularity. Visualized left MCA branches are within normal limits. IMPRESSION: 1. Widespread intracranial large and medium-sized vessel atherosclerosis. 2. No left ACA proximal stenosis or occlusion. There is a moderate right PCA P1 segment stenosis with relatively preserved distal flow. Original Report Authenticated By: Randall An, M.D.  Dg Chest Portable 1 View  10/07/2011 *RADIOLOGY REPORT* Clinical Data: Recent fall, altered mental status PORTABLE CHEST - 1 VIEW Comparison: Chest x-ray of 08/07/2011 Findings: The lungs are clear. The heart is mildly enlarged and stable. No bony abnormality is seen. IMPRESSION: Stable mild cardiomegaly. No active lung disease. Original Report Authenticated By: Joretta Bachelor, M.D.  Carotid Dopplers  Preliminary - No significant ICA stenosis bilaterally. Vertebral arteries are patent with antegrade flow.  Rich Brave  10/08/2011, 1:22 PM  2 D ECHO  Study Conclusions  - Left ventricle: The cavity size was normal. Wall thickness was increased in a pattern of moderate LVH. Systolic function was mildly to moderately reduced. The estimated ejection fraction was 40%. Diffuse hypokinesis. Doppler parameters are consistent with abnormal left ventricular relaxation (grade 1 diastolic dysfunction). - Aortic valve: There was no stenosis. - Mitral valve: No significant regurgitation. - Left atrium: The atrium was mildly dilated. - Right ventricle: The cavity size was normal. Systolic function was normal. - Pulmonary arteries: No complete  TR doppler jet so unable to estimate PA systolic pressure. - Inferior vena cava: The vessel was normal in size;  the respirophasic diameter changes were in the normal range (= 50%); findings are consistent with normal central venous pressure. Impressions:  - Normal LV size with moderate LV hypertrophy. Mild to moderate global hypokinesis with estimated EF 40%. EF estimation was made more difficult because the patient was profoundly bradycardic during this study (HR in the 30s). No significant valvular abnormalities. Normal RV size and systolic function.  Hospital Course:  1. 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 TEE was negative for an embolic source but aorta was noted to have severe atherosclerosis. An event monitor on discharge was recommended by Dr. Rayann Heman and will be arranged. He will need to follow up with cardiology for the results. The patient is being discharge to a rehab facility.  2.Sinus bradycardia- His heart rate on admission was 30-40. He was noted to be on Coreg which was discontinued. Heart rate has improved to 50-60 range.  3. 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 Cardiomyopathy  5. ARF on CKD III- suspected to be due to dehydration and poor perfusion from his bradycardia. It has improved w/ hydration and he is back to his baseline which is about 2.5-2.8.  6. HTN- Coreg was discontinued as mentioned above. Hydralazine and Imdur have been added. Norvasc has been continued.  7. Cocaine abuse- he was councilled regarding this.  Day of Discharge  BP 179/73  Pulse 56  Temp(Src) 98.1 F (36.7 C) (Oral)  Resp 19  Ht 5\' 10"  (1.778 m)  Wt 63.504 kg (140 lb)  BMI 20.09 kg/m2  SpO2 99%  Physical Exam:  Lungs: Clear to auscultation bilaterally without wheezes or crackles  Cardiovascular: Bradycardia without appreciable gallop rub or murmur  Abdomen: Nontender, nondistended, soft, bowel sounds positive, no rebound, no  ascites, no appreciable mass  Extremities: No significant cyanosis, clubbing, or edema bilateral lower extremities  Neuro:  Cranial Nerves:  II- Visual acuity intact. Dense left homonymous hemianopsia  III/IV/VI-Extraocular movements intact. Pupils reactive bilaterally.  V/VII-Decrease in right NLF.  VIII-grossly intact  IX/X-decreased gag  XI-decreased right shoulder shrug  XII-midline tongue extension  Motor: Increased tone on the right. RUE strength at 4/5, 2/5 RLE strength. 5/5 strength on the left. Mild right upper extremity drift present. Diminished fine finger movements on the right. Orbits left over right upper extremity. Right grip is weak.  Sensory: Pinprick and light touch decreased on the right  Deep Tendon Reflexes: 2+ and symmetric throughout with absent AJ's bilaterally  Plantars: Downgoing on the left and upgoing on the right  Cerebellar: Normal finger-to-nose and heel-to-shin testing not performed.  Results for orders placed during the hospital encounter of 10/07/11 (from the past 24 hour(s))   BASIC METABOLIC PANEL Status: Abnormal    Collection Time    10/10/11 3:57 AM   Component  Value  Range    Sodium  142  135 - 145 (mEq/L)    Potassium  4.6  3.5 - 5.1 (mEq/L)    Chloride  114 (*)  96 - 112 (mEq/L)    CO2  18 (*)  19 - 32 (mEq/L)    Glucose, Bld  77  70 - 99 (mg/dL)    BUN  33 (*)  6 - 23 (mg/dL)    Creatinine, Ser  2.70 (*)  0.50 - 1.35 (mg/dL)    Calcium  8.9  8.4 - 10.5 (mg/dL)    GFR calc non Af Amer  23 (*)  >90 (mL/min)    GFR calc Af Amer  27 (*)  >90 (mL/min)    Disposition: stable  Follow-up Appts:  Discharge Orders    Future Appointments:  Provider:  Department:  Dept Phone:  Center:    10/20/2011 11:00 AM  Melissa C. Al-Rammal  [id number]   None    10/20/2011 12:00 PM  Lelon Perla, MD  [place]  [phone]  [id number]      Future Orders  Please Complete By  Expires    Diet - low sodium heart healthy      Walk with assistance       Increase activity slowly        Follow-up with [facility] in 2 weeks.  Tests Needing Follow-up:  Event Monitor  Signed:  Wonda Cerise  10/10/2011, 2:33 PM

## 2011-11-07 ENCOUNTER — Telehealth: Payer: Self-pay | Admitting: Cardiology

## 2011-11-07 NOTE — Telephone Encounter (Signed)
New problem [facility] called They want orders to continue care 2 times a week for two weeks Please call her back

## 2011-11-07 NOTE — Telephone Encounter (Signed)
HOME HEALTH AWARE MAY  CONT. CARE AS SUGGESTED  BELOW HOME HEALTH TO FAX ORDER FOR MD SIGNATURE  VERBAL ORDER GIVEN VIA PHONE  PT'S ON HOME HEALTH FOR B/P AND CHF ./CY

## 2011-11-10 ENCOUNTER — Telehealth: Payer: Self-pay | Admitting: Cardiology

## 2011-11-10 NOTE — Telephone Encounter (Signed)
New Msg: Pt nurse from [place] calling stating that she needs an order for a Education officer, museum. Please return call to discuss further. Pt has refused in the past however, now pt wants placement.

## 2011-11-10 NOTE — Telephone Encounter (Signed)
This should go through primary care Kirk Ruths

## 2011-11-11 ENCOUNTER — Telehealth: Payer: Self-pay | Admitting: Cardiology

## 2011-11-11 NOTE — Telephone Encounter (Signed)
Spoke with pt, according to the front desk and medical records no paperwork received as of yet Trevor [name]

## 2011-11-11 NOTE — Telephone Encounter (Signed)
Walk In pt Form " pt dropped Off [employer] form to be completed " sent to Luisa Dago  11/11/11/km

## 2011-11-11 NOTE — Telephone Encounter (Signed)
Pt needs to know if some paperwork was dropped off yesterday from the [place]

## 2011-11-11 NOTE — Telephone Encounter (Signed)
Spoke with home care, order given for social worker to try to help get placement for pt in assisted living. Made them aware we will not sign any other primary orders. Fredia Beets

## 2011-11-13 ENCOUNTER — Encounter (INDEPENDENT_AMBULATORY_CARE_PROVIDER_SITE_OTHER): Payer: Medicare Other

## 2011-11-13 ENCOUNTER — Ambulatory Visit (INDEPENDENT_AMBULATORY_CARE_PROVIDER_SITE_OTHER): Payer: Medicare Other | Admitting: Cardiology

## 2011-11-13 ENCOUNTER — Encounter: Payer: Self-pay | Admitting: Cardiology

## 2011-11-13 ENCOUNTER — Telehealth: Payer: Self-pay | Admitting: Cardiology

## 2011-11-13 ENCOUNTER — Encounter (INDEPENDENT_AMBULATORY_CARE_PROVIDER_SITE_OTHER): Payer: Medicare Other | Admitting: Cardiology

## 2011-11-13 DIAGNOSIS — R002 Palpitations: Secondary | ICD-10-CM

## 2011-11-13 DIAGNOSIS — Z01810 Encounter for preprocedural cardiovascular examination: Secondary | ICD-10-CM

## 2011-11-13 DIAGNOSIS — I639 Cerebral infarction, unspecified: Secondary | ICD-10-CM

## 2011-11-13 DIAGNOSIS — I6350 Cerebral infarction due to unspecified occlusion or stenosis of unspecified cerebral artery: Secondary | ICD-10-CM

## 2011-11-13 DIAGNOSIS — I10 Essential (primary) hypertension: Secondary | ICD-10-CM

## 2011-11-13 DIAGNOSIS — R0989 Other specified symptoms and signs involving the circulatory and respiratory systems: Secondary | ICD-10-CM

## 2011-11-13 DIAGNOSIS — R072 Precordial pain: Secondary | ICD-10-CM

## 2011-11-13 DIAGNOSIS — I428 Other cardiomyopathies: Secondary | ICD-10-CM

## 2011-11-13 DIAGNOSIS — I714 Abdominal aortic aneurysm, without rupture: Secondary | ICD-10-CM

## 2011-11-13 DIAGNOSIS — F1410 Cocaine abuse, uncomplicated: Secondary | ICD-10-CM

## 2011-11-13 DIAGNOSIS — N289 Disorder of kidney and ureter, unspecified: Secondary | ICD-10-CM

## 2011-11-13 DIAGNOSIS — F17200 Nicotine dependence, unspecified, uncomplicated: Secondary | ICD-10-CM

## 2011-11-13 NOTE — Assessment & Plan Note (Signed)
Patient counseled on discontinuing. 

## 2011-11-13 NOTE — Assessment & Plan Note (Signed)
Recent stroke. CardioNet to exclude atrial fibrillation.

## 2011-11-13 NOTE — Telephone Encounter (Signed)
Fu call [facility] was calling you back She said he was there 0913 and no show on 1018 Please call her back

## 2011-11-13 NOTE — Telephone Encounter (Signed)
Patient came in and had monitor put on 11/13/11

## 2011-11-13 NOTE — Patient Instructions (Signed)
Your physician wants you to follow-up in: 6 MONTHS You will receive a reminder letter in the mail two months in advance. If you don't receive a letter, please call our office to schedule the follow-up appointment.   Your physician has recommended that you wear an event monitor. Event monitors are medical devices that record the heart's electrical activity. Doctors most often [place] these monitors to diagnose arrhythmias. Arrhythmias are problems with the speed or rhythm of the heartbeat. The monitor is a small, portable device. You can wear one while you do your normal daily activities. This is usually used to diagnose what is causing palpitations/syncope (passing out).   HAVE SOMEONE CALL ME WITH ALL THE MEDICINE YOU TAKE

## 2011-11-13 NOTE — Assessment & Plan Note (Signed)
Increase medications as needed.

## 2011-11-13 NOTE — Progress Notes (Signed)
   HPI: Pleasant male I initially saw in September of 2012 with symptoms concerning for angina. We scheduled a cardiac catheterization but this was canceled because screening laboratories showed significant renal insufficiency (Cr 2.9). A Myoview was therefore performed in September of 2012 and revealed apical thinning, global hypokinesis and an ejection fraction of 40%. Renal dopplers revealed no RAS. Abdominal ultrasound was scheduled for bruit but patient did not show for the study. Admitted with CVA in Nov 2012; repeat echo showed EF 40, mild LAE. TEE in Nov 2012 showed EF 45-50, mild LAE and no SOE. Carotid dopplers in Nov 2012 showed no significant stenosis bilaterally. Drug screen positive for cocaine. Patient also bradycardic and beta blocker discontinued. Monitor ordered at dc but not performed. Patient denies dyspnea, chest pain or syncope. Does not know meds.    Current Outpatient Prescriptions  Medication Sig Dispense Refill  . amLODipine (NORVASC) 10 MG tablet Take 1 tablet (10 mg total) by mouth daily.  30 tablet  11  . aspirin 325 MG tablet Take 1 tablet (325 mg total) by mouth daily.      . hydrALAZINE (APRESOLINE) 25 MG tablet Take 1 tablet (25 mg total) by mouth every 8 (eight) hours.      . isosorbide mononitrate (IMDUR) 30 MG 24 hr tablet Take 1 tablet (30 mg total) by mouth daily.      . nitroGLYCERIN (NITROSTAT) 0.4 MG SL tablet Place 0.4 mg under the tongue every 5 (five) minutes as needed. For chest pain          Past Medical History  Diagnosis Date  . HTN (hypertension)   . Anxiety   . Cardiomyopathy   . Renal insufficiency   . Myocardial infarction   . CVA (cerebral infarction)     Past Surgical History  Procedure Date  . Tee without cardioversion 10/09/2011    Procedure: TRANSESOPHAGEAL ECHOCARDIOGRAM (TEE);  Surgeon: Lelon Perla, MD;  Location: [facility] ENDOSCOPY;  Service: Cardiovascular;  Laterality: N/A;    History   Social History  . Marital Status:  Single    Spouse Name: N/A    Number of Children: 2  . Years of Education: N/A   Occupational History  . Not on file.   Social History Main Topics  . Smoking status: Current Everyday Smoker -- 0.5 packs/day for 50 years    Types: Cigarettes  . Smokeless tobacco: Not on file  . Alcohol Use: Yes     Occasional  . Drug Use: 2 per week    Special: "Crack" cocaine  . Sexually Active: Yes   Other Topics Concern  . Not on file   Social History Narrative  . No narrative on file    ROS: no fevers or chills, productive cough, hemoptysis, dysphasia, odynophagia, melena, hematochezia, dysuria, hematuria, rash, seizure activity, orthopnea, PND, pedal edema, claudication. Remaining systems are negative.  Physical Exam: Well-developed well-nourished in no acute distress.  Skin is warm and dry.  HEENT is normal.  Neck is supple. No thyromegaly.  Chest is clear to auscultation with normal expansion.  Cardiovascular exam is regular rate and rhythm.  Abdominal exam nontender or distended. No masses palpated. Extremities show no edema. neuro grossly intact  ECG Sinus bradycardia, inferolateral TWI

## 2011-11-13 NOTE — Assessment & Plan Note (Signed)
Etiology of cardiomyopathy most likely hypertension. He is unclear on what medications he is taking. He will contact us and we will increase based on that. Continue to increase medications as needed or blood pressure and then repeat echocardiogram once fully controlled. No beta blocker as he has had recurrent bradycardia with heart rates in the 30s. No ACE inhibitor secondary to renal insufficiency.

## 2011-11-13 NOTE — Assessment & Plan Note (Signed)
Followed by nephrology. I have also recommended patient to establish with a primary care physician.

## 2011-11-13 NOTE — Assessment & Plan Note (Signed)
Await abdominal ultrasound to exclude aneurysm.

## 2011-11-19 ENCOUNTER — Telehealth: Payer: Self-pay | Admitting: *Deleted

## 2011-11-19 NOTE — Telephone Encounter (Signed)
Spoke with pt, meds were confirmed. He was also taking atenolol as well as carvedilol. Pt instructed to stop the  Atenolol. Will forward for dr Jacalyn Lefevre review

## 2011-11-19 NOTE — Telephone Encounter (Signed)
Spoke with pam, the pt has no follow up appt with them. They have left messages and he has not called back

## 2011-11-20 NOTE — Telephone Encounter (Signed)
Patient should not take atenolol; change hydralazine to 50 mg po tid (#90). Kirk Ruths

## 2011-11-27 MED ORDER — HYDRALAZINE HCL 50 MG PO TABS: 50.0000 mg | ORAL_TABLET | Freq: Three times a day (TID) | ORAL | Status: DC

## 2011-11-27 NOTE — Telephone Encounter (Signed)
Spoke with pt, he is aware of med change, he voiced understanding.

## 2011-11-27 NOTE — Telephone Encounter (Signed)
F/up:  Pt has returned your call.  Please call [phone].

## 2012-05-05 ENCOUNTER — Emergency Department (HOSPITAL_COMMUNITY): Payer: Medicare Other

## 2012-05-05 ENCOUNTER — Observation Stay (HOSPITAL_COMMUNITY)
Admission: EM | Admit: 2012-05-05 | Discharge: 2012-05-08 | Disposition: A | Discharge status: Home / Self Care | Payer: Medicare Other | Source: Ambulatory Visit | Attending: Internal Medicine | Admitting: Internal Medicine

## 2012-05-05 ENCOUNTER — Encounter (HOSPITAL_COMMUNITY): Payer: Self-pay | Admitting: Emergency Medicine

## 2012-05-05 DIAGNOSIS — I252 Old myocardial infarction: Secondary | ICD-10-CM | POA: Insufficient documentation

## 2012-05-05 DIAGNOSIS — R0602 Shortness of breath: Secondary | ICD-10-CM | POA: Insufficient documentation

## 2012-05-05 DIAGNOSIS — N186 End stage renal disease: Secondary | ICD-10-CM

## 2012-05-05 DIAGNOSIS — I2510 Atherosclerotic heart disease of native coronary artery without angina pectoris: Secondary | ICD-10-CM | POA: Insufficient documentation

## 2012-05-05 DIAGNOSIS — Z8673 Personal history of transient ischemic attack (TIA), and cerebral infarction without residual deficits: Secondary | ICD-10-CM | POA: Insufficient documentation

## 2012-05-05 DIAGNOSIS — F17200 Nicotine dependence, unspecified, uncomplicated: Secondary | ICD-10-CM | POA: Insufficient documentation

## 2012-05-05 DIAGNOSIS — I10 Essential (primary) hypertension: Secondary | ICD-10-CM

## 2012-05-05 DIAGNOSIS — I120 Hypertensive chronic kidney disease with stage 5 chronic kidney disease or end stage renal disease: Secondary | ICD-10-CM | POA: Insufficient documentation

## 2012-05-05 DIAGNOSIS — N289 Disorder of kidney and ureter, unspecified: Secondary | ICD-10-CM

## 2012-05-05 DIAGNOSIS — R0989 Other specified symptoms and signs involving the circulatory and respiratory systems: Secondary | ICD-10-CM | POA: Insufficient documentation

## 2012-05-05 DIAGNOSIS — R072 Precordial pain: Secondary | ICD-10-CM

## 2012-05-05 DIAGNOSIS — I4891 Unspecified atrial fibrillation: Secondary | ICD-10-CM | POA: Insufficient documentation

## 2012-05-05 DIAGNOSIS — I428 Other cardiomyopathies: Secondary | ICD-10-CM | POA: Insufficient documentation

## 2012-05-05 DIAGNOSIS — E875 Hyperkalemia: Secondary | ICD-10-CM | POA: Insufficient documentation

## 2012-05-05 DIAGNOSIS — R079 Chest pain, unspecified: Principal | ICD-10-CM | POA: Insufficient documentation

## 2012-05-05 DIAGNOSIS — Z01810 Encounter for preprocedural cardiovascular examination: Secondary | ICD-10-CM

## 2012-05-05 DIAGNOSIS — Z720 Tobacco use: Secondary | ICD-10-CM | POA: Diagnosis present

## 2012-05-05 DIAGNOSIS — I959 Hypotension, unspecified: Secondary | ICD-10-CM | POA: Diagnosis present

## 2012-05-05 HISTORY — DX: Unspecified osteoarthritis, unspecified site: M19.90

## 2012-05-05 HISTORY — DX: Cerebral infarction, unspecified: I63.9

## 2012-05-05 HISTORY — DX: Headache: R51

## 2012-05-05 LAB — DIFFERENTIAL
Basophils Absolute: 0.1 10*3/uL (ref 0.0–0.1)
Basophils Relative: 1 % (ref 0–1)
Eosinophils Absolute: 0.5 10*3/uL (ref 0.0–0.7)
Eosinophils Relative: 6 % — ABNORMAL HIGH (ref 0–5)
Lymphs Abs: 1.8 10*3/uL (ref 0.7–4.0)
Neutrophils Relative %: 65 % (ref 43–77)

## 2012-05-05 LAB — CBC
HCT: 27.6 % — ABNORMAL LOW (ref 39.0–52.0)
MCH: 28.8 pg (ref 26.0–34.0)
MCH: 27.9 pg (ref 26.0–34.0)
MCHC: 34.1 g/dL (ref 30.0–36.0)
MCV: 82.7 fL (ref 78.0–100.0)
MCV: 81.9 fL (ref 78.0–100.0)
Platelets: 238 10*3/uL (ref 150–400)
Platelets: 237 10*3/uL (ref 150–400)
RBC: 3.3 MIL/uL — ABNORMAL LOW (ref 4.22–5.81)
RDW: 13.5 % (ref 11.5–15.5)
RDW: 13.5 % (ref 11.5–15.5)
WBC: 8.8 10*3/uL (ref 4.0–10.5)
WBC: 8.3 10*3/uL (ref 4.0–10.5)

## 2012-05-05 LAB — BASIC METABOLIC PANEL
Calcium: 8.7 mg/dL (ref 8.4–10.5)
GFR calc Af Amer: 16 mL/min — ABNORMAL LOW (ref >90)
GFR calc non Af Amer: 13 mL/min — ABNORMAL LOW (ref >90)
Glucose, Bld: 116 mg/dL — ABNORMAL HIGH (ref 70–99)
Potassium: 5.6 mEq/L — ABNORMAL HIGH (ref 3.5–5.1)
Sodium: 140 mEq/L (ref 135–145)

## 2012-05-05 LAB — TROPONIN I: Troponin I: <0.3 ng/mL (ref <0.30)

## 2012-05-05 MED ORDER — SODIUM BICARBONATE 8.4 % IV SOLN
50.0000 meq | Freq: Once | INTRAVENOUS | Status: AC
  Administered 2012-05-05: 50 meq via INTRAVENOUS
  Filled 2012-05-05: qty 50

## 2012-05-05 MED ORDER — TAMSULOSIN HCL 0.4 MG PO CAPS
0.4000 mg | ORAL_CAPSULE | Freq: Two times a day (BID) | ORAL | Status: DC
  Administered 2012-05-06 – 2012-05-08 (×6): 0.4 mg via ORAL
  Filled 2012-05-05 (×7): qty 1

## 2012-05-05 MED ORDER — NIFEDIPINE ER 60 MG PO TB24
60.0000 mg | ORAL_TABLET | Freq: Every day | ORAL | Status: DC
  Administered 2012-05-06 – 2012-05-08 (×3): 60 mg via ORAL
  Filled 2012-05-05 (×3): qty 1

## 2012-05-05 MED ORDER — NITROGLYCERIN 0.4 MG SL SUBL
0.4000 mg | SUBLINGUAL_TABLET | SUBLINGUAL | Status: DC | PRN
  Administered 2012-05-05: 0.4 mg via SUBLINGUAL
  Filled 2012-05-05: qty 25

## 2012-05-05 MED ORDER — AMLODIPINE BESYLATE 10 MG PO TABS
10.0000 mg | ORAL_TABLET | Freq: Every day | ORAL | Status: DC
  Administered 2012-05-06 – 2012-05-08 (×3): 10 mg via ORAL
  Filled 2012-05-05 (×3): qty 1

## 2012-05-05 MED ORDER — NITROGLYCERIN 0.2 MG/HR TD PT24
0.2000 mg | MEDICATED_PATCH | Freq: Every day | TRANSDERMAL | Status: DC
  Administered 2012-05-06 – 2012-05-07 (×3): 0.2 mg via TRANSDERMAL
  Filled 2012-05-05 (×3): qty 1

## 2012-05-05 MED ORDER — SODIUM CHLORIDE 0.9 % IV SOLN: 250.0000 mL | INTRAVENOUS | Status: DC | PRN

## 2012-05-05 MED ORDER — SODIUM CHLORIDE 0.9 % IJ SOLN: 3.0000 mL | INTRAMUSCULAR | Status: DC | PRN

## 2012-05-05 MED ORDER — SODIUM CHLORIDE 0.9 % IJ SOLN
3.0000 mL | Freq: Two times a day (BID) | INTRAMUSCULAR | Status: DC
  Administered 2012-05-06 – 2012-05-08 (×4): 3 mL via INTRAVENOUS

## 2012-05-05 MED ORDER — SODIUM CHLORIDE 0.9 % IJ SOLN
3.0000 mL | Freq: Two times a day (BID) | INTRAMUSCULAR | Status: DC
  Administered 2012-05-06 – 2012-05-07 (×4): 3 mL via INTRAVENOUS

## 2012-05-05 MED ORDER — ENOXAPARIN SODIUM 30 MG/0.3ML ~~LOC~~ SOLN
30.0000 mg | Freq: Every day | SUBCUTANEOUS | Status: DC
  Administered 2012-05-06 – 2012-05-08 (×3): 30 mg via SUBCUTANEOUS
  Filled 2012-05-05 (×3): qty 0.3

## 2012-05-05 MED ORDER — FINASTERIDE 5 MG PO TABS
5.0000 mg | ORAL_TABLET | Freq: Every day | ORAL | Status: DC
  Administered 2012-05-06 – 2012-05-08 (×3): 5 mg via ORAL
  Filled 2012-05-05 (×3): qty 1

## 2012-05-05 MED ORDER — ISOSORBIDE MONONITRATE ER 30 MG PO TB24
30.0000 mg | ORAL_TABLET | Freq: Every day | ORAL | Status: DC
  Administered 2012-05-06 – 2012-05-08 (×3): 30 mg via ORAL
  Filled 2012-05-05 (×3): qty 1

## 2012-05-05 MED ORDER — SODIUM POLYSTYRENE SULFONATE 15 GM/60ML PO SUSP
30.0000 g | Freq: Once | ORAL | Status: AC
  Administered 2012-05-05: 30 g via ORAL
  Filled 2012-05-05: qty 120

## 2012-05-05 MED ORDER — SODIUM BICARBONATE 650 MG PO TABS
325.0000 mg | ORAL_TABLET | Freq: Two times a day (BID) | ORAL | Status: DC
  Administered 2012-05-06: 325 mg via ORAL
  Administered 2012-05-06: 01:00:00 via ORAL
  Administered 2012-05-06: 325 mg via ORAL
  Filled 2012-05-05 (×7): qty 0.5

## 2012-05-05 MED ORDER — ACETAMINOPHEN 325 MG PO TABS: 650.0000 mg | ORAL_TABLET | Freq: Four times a day (QID) | ORAL | Status: DC | PRN

## 2012-05-05 MED ORDER — CARVEDILOL 6.25 MG PO TABS
6.2500 mg | ORAL_TABLET | Freq: Two times a day (BID) | ORAL | Status: DC
  Administered 2012-05-06 – 2012-05-08 (×5): 6.25 mg via ORAL
  Filled 2012-05-05 (×7): qty 1

## 2012-05-05 MED ORDER — HYDRALAZINE HCL 50 MG PO TABS
50.0000 mg | ORAL_TABLET | Freq: Three times a day (TID) | ORAL | Status: DC
  Administered 2012-05-06 – 2012-05-08 (×8): 50 mg via ORAL
  Filled 2012-05-05 (×11): qty 1

## 2012-05-05 MED ORDER — ASPIRIN 81 MG PO CHEW
81.0000 mg | CHEWABLE_TABLET | Freq: Every day | ORAL | Status: DC
  Administered 2012-05-06 – 2012-05-08 (×3): 81 mg via ORAL
  Filled 2012-05-05 (×3): qty 1

## 2012-05-05 MED ORDER — MORPHINE SULFATE 2 MG/ML IJ SOLN: 2.0000 mg | INTRAMUSCULAR | Status: DC | PRN

## 2012-05-05 MED ORDER — NITROGLYCERIN 0.4 MG SL SUBL: 0.4000 mg | SUBLINGUAL_TABLET | SUBLINGUAL | Status: DC | PRN

## 2012-05-05 MED ORDER — LORATADINE 10 MG PO TABS
10.0000 mg | ORAL_TABLET | Freq: Every day | ORAL | Status: DC
Start: 2012-05-06 — End: 2012-05-08
  Administered 2012-05-06 – 2012-05-08 (×3): 10 mg via ORAL
  Filled 2012-05-05 (×3): qty 1

## 2012-05-05 NOTE — ED Notes (Signed)
Pt arrived via EMS co centralized chest pain started this morning 6/10 on pain scale and constant.  Pt denies n/v but is SOB.  Pt warm and dry.  Pt hx of MI last oct and renal failure.  Pt is waiting for kidney transplant no dialysis.  Pt reports a lot of stress.  Pt given 324 aspirin and 2 nitro by EMS.  20 in Left forearm KVO.  Pt alert and oriented X4

## 2012-05-05 NOTE — ED Notes (Signed)
Nurse unable to take report.  She is to call back

## 2012-05-05 NOTE — ED Provider Notes (Signed)
History     CSN: [id number]  Arrival date & time 05/05/12  1713   None     Chief Complaint  Patient presents with  . Chest Pain    (Consider location/radiation/quality/duration/timing/severity/associated sxs/prior treatment) Patient is a 66 y.o. male presenting with chest pain. The history is provided by the patient.  Chest Pain The chest pain began 6 - 12 hours ago. Chest pain occurs constantly. The chest pain is unchanged. Associated with: unknown. At its most intense, the pain is at 8/10. The pain is currently at 7/10. The severity of the pain is moderate. Quality: throbbing in right chest. The pain does not radiate. Exacerbated by: nothing. Primary symptoms include shortness of breath (mild). Pertinent negatives for primary symptoms include no fever, no cough, no abdominal pain, no nausea and no vomiting.  Pertinent negatives for associated symptoms include no numbness. Risk factors: smoker, HTN, hx of MI.     Past Medical History  Diagnosis Date  . HTN (hypertension)   . Anxiety   . Cardiomyopathy   . Renal insufficiency   . Myocardial infarction   . CVA (cerebral infarction)     Past Surgical History  Procedure Date  . Tee without cardioversion 10/09/2011    Procedure: TRANSESOPHAGEAL ECHOCARDIOGRAM (TEE);  Surgeon: Lelon Perla, MD;  Location: [facility] ENDOSCOPY;  Service: Cardiovascular;  Laterality: N/A;    Family History  Problem Relation Age of Onset  . Heart attack Mother     MI in her 110s  . Anesthesia problems Neg Hx   . Hypotension Neg Hx   . Malignant hyperthermia Neg Hx   . Pseudochol deficiency Neg Hx     History  Substance Use Topics  . Smoking status: Current Everyday Smoker -- 0.5 packs/day for 50 years    Types: Cigarettes  . Smokeless tobacco: Not on file  . Alcohol Use: Yes     Occasional      Review of Systems  Constitutional: Negative for fever.  HENT: Negative for rhinorrhea, drooling and neck pain.   Eyes: Negative for pain.    Respiratory: Positive for shortness of breath (mild). Negative for cough.   Cardiovascular: Positive for chest pain. Negative for leg swelling.  Gastrointestinal: Negative for nausea, vomiting, abdominal pain and diarrhea.  Genitourinary: Negative for dysuria and hematuria.  Musculoskeletal: Negative for gait problem.  Skin: Negative for color change.  Neurological: Negative for numbness and headaches.  Hematological: Negative for adenopathy.  Psychiatric/Behavioral: Negative for behavioral problems.  All other systems reviewed and are negative.    Allergies  Review of patient's allergies indicates no known allergies.  Home Medications   Current Outpatient Rx  Name Route Sig Dispense Refill  . ACETAMINOPHEN 325 MG PO TABS Oral Take 650 mg by mouth every 6 (six) hours as needed. For pain    . ASPIRIN 81 MG PO CHEW Oral Chew 81 mg by mouth daily.    Marland Kitchen FINASTERIDE 5 MG PO TABS Oral Take 5 mg by mouth daily.    Marland Kitchen LORATADINE 10 MG PO TABS Oral Take 10 mg by mouth daily.    Marland Kitchen NIFEDIPINE ER 60 MG PO TB24 Oral Take 60 mg by mouth daily.    . SODIUM BICARBONATE 325 MG PO TABS Oral Take 325 mg by mouth 2 (two) times daily.    Marland Kitchen TAMSULOSIN HCL 0.4 MG PO CAPS Oral Take 0.4 mg by mouth 2 (two) times daily.    Marland Kitchen AMLODIPINE BESYLATE 10 MG PO TABS Oral Take 1  tablet (10 mg total) by mouth daily. 30 tablet 11  . CARVEDILOL 6.25 MG PO TABS Oral Take 6.25 mg by mouth 2 (two) times daily with a meal.      . HYDRALAZINE HCL 50 MG PO TABS Oral Take 1 tablet (50 mg total) by mouth 3 (three) times daily. 90 tablet 12  . ISOSORBIDE MONONITRATE ER 30 MG PO TB24 Oral Take 1 tablet (30 mg total) by mouth daily.    Marland Kitchen NITROGLYCERIN 0.4 MG SL SUBL Sublingual Place 0.4 mg under the tongue every 5 (five) minutes as needed. For chest pain       BP 108/54  Pulse 71  Temp(Src) 97.5 F (36.4 C) (Oral)  Resp 20  SpO2 99%  Physical Exam  Nursing note and vitals reviewed. Constitutional: He is oriented to  person, place, and time. He appears well-developed and well-nourished.  HENT:  Head: Normocephalic and atraumatic.  Right Ear: External ear normal.  Left Ear: External ear normal.  Nose: Nose normal.  Mouth/Throat: Oropharynx is clear and moist. No oropharyngeal exudate.  Eyes: Conjunctivae and EOM are normal. Pupils are equal, round, and reactive to light.  Neck: Normal range of motion. Neck supple.  Cardiovascular: Normal rate, regular rhythm, normal heart sounds and intact distal pulses.  Exam reveals no gallop and no friction rub.   No murmur heard. Pulmonary/Chest: Effort normal and breath sounds normal. No respiratory distress. He has no wheezes.  Abdominal: Soft. Bowel sounds are normal. He exhibits no distension. There is no tenderness. There is no rebound and no guarding.  Musculoskeletal: Normal range of motion. He exhibits no edema and no tenderness.  Neurological: He is alert and oriented to person, place, and time.  Skin: Skin is warm and dry.  Psychiatric: He has a normal mood and affect. His behavior is normal.       Mild drowsiness on exam.    ED Course  Procedures (including critical care time)  Labs Reviewed - No data to display No results found.   No diagnosis found.   Date: 05/05/2012  Rate: 73  Rhythm: normal sinus rhythm  QRS Axis: normal  Intervals: QT prolonged  ST/T Wave abnormalities: nonspecific ST/T changes  Conduction Disutrbances:none  Narrative Interpretation: No new ST or T wave changes cw ischemia, no peaked T waves  Old EKG Reviewed: unchanged    MDM  5:53 PM 66 y.o. male w hx of HTN, cardiomyopathy, MI, renal insuff, hx of cocaine abuse pw cp that began at approx 11am this morning and has persisted. Pt AFVSS here, mild drowsiness on exam. Pt got 325 ASA en route, and 2 nitro w/ mild relief. Will get labs, nitro for pain.   Will admit for cp r/o. Pt also has worsening renal function and elevated K. Ordered an amp of bicarb and po  kayexalate in the ED.   Clinical Impression 1. Chest pain   2. Renal insufficiency   3. Hyperkalemia   4. Precordial pain   5. Bruit   6. Pre-operative cardiovascular examination          Pamella Pert, MD 05/06/12 [phone]

## 2012-05-06 ENCOUNTER — Encounter (HOSPITAL_COMMUNITY): Payer: Self-pay | Admitting: General Practice

## 2012-05-06 DIAGNOSIS — R072 Precordial pain: Secondary | ICD-10-CM

## 2012-05-06 DIAGNOSIS — F17200 Nicotine dependence, unspecified, uncomplicated: Secondary | ICD-10-CM

## 2012-05-06 DIAGNOSIS — N186 End stage renal disease: Secondary | ICD-10-CM

## 2012-05-06 DIAGNOSIS — R079 Chest pain, unspecified: Secondary | ICD-10-CM

## 2012-05-06 DIAGNOSIS — I10 Essential (primary) hypertension: Secondary | ICD-10-CM

## 2012-05-06 LAB — BASIC METABOLIC PANEL
BUN: 42 mg/dL — ABNORMAL HIGH (ref 6–23)
CO2: 17 mEq/L — ABNORMAL LOW (ref 19–32)
CO2: 17 mEq/L — ABNORMAL LOW (ref 19–32)
Calcium: 8.5 mg/dL (ref 8.4–10.5)
Chloride: 115 mEq/L — ABNORMAL HIGH (ref 96–112)
Chloride: 113 mEq/L — ABNORMAL HIGH (ref 96–112)
Creatinine, Ser: 4.27 mg/dL — ABNORMAL HIGH (ref 0.50–1.35)
Creatinine, Ser: 4.25 mg/dL — ABNORMAL HIGH (ref 0.50–1.35)
Glucose, Bld: 84 mg/dL (ref 70–99)
Glucose, Bld: 83 mg/dL (ref 70–99)

## 2012-05-06 LAB — CBC
HCT: 26.1 % — ABNORMAL LOW (ref 39.0–52.0)
MCH: 28.7 pg (ref 26.0–34.0)
MCHC: 34.5 g/dL (ref 30.0–36.0)
MCV: 83.1 fL (ref 78.0–100.0)
Platelets: 213 10*3/uL (ref 150–400)
RDW: 13.6 % (ref 11.5–15.5)

## 2012-05-06 LAB — RAPID URINE DRUG SCREEN, HOSP PERFORMED
Amphetamines: NOT DETECTED
Benzodiazepines: NOT DETECTED
Cocaine: NOT DETECTED
Opiates: NOT DETECTED

## 2012-05-06 LAB — CARDIAC PANEL(CRET KIN+CKTOT+MB+TROPI)
Relative Index: INVALID (ref 0.0–2.5)
Relative Index: INVALID (ref 0.0–2.5)
Relative Index: INVALID (ref 0.0–2.5)
Total CK: 70 U/L (ref 7–232)
Total CK: 58 U/L (ref 7–232)
Total CK: 57 U/L (ref 7–232)
Troponin I: <0.3 ng/mL (ref <0.30)

## 2012-05-06 LAB — TSH: TSH: 1.631 u[IU]/mL (ref 0.350–4.500)

## 2012-05-06 MED ORDER — SODIUM POLYSTYRENE SULFONATE 15 GM/60ML PO SUSP
45.0000 g | Freq: Once | ORAL | Status: AC
  Administered 2012-05-06: 45 g via ORAL
  Filled 2012-05-06: qty 180

## 2012-05-06 MED ORDER — SODIUM POLYSTYRENE SULFONATE 15 GM/60ML PO SUSP
60.0000 g | Freq: Once | ORAL | Status: AC
  Administered 2012-05-06: 60 g via ORAL
  Filled 2012-05-06: qty 240

## 2012-05-06 NOTE — Consult Note (Signed)
Pt says he smokes 6 cigarettes per day down from 2 ppd. He says he needs help with quitting the 6 cigarettes that he is currently smoking as he can't seem to quit on his own at this point. Recommended 2 mg nicotine gum to pt to be used PRN. Discussed gum use instructions. Pt verrbalizes understanding. Referred to 1-800 quit now for f/u and support. Discussed oral fixation substitutes, second hand smoke and in home smoking policy. Reviewed and gave pt Written education/contact information.

## 2012-05-06 NOTE — Progress Notes (Signed)
Subjective: Patient denies any current chest pain. Patient states at [facility] they have decided not to place on HD but to find a transplant.  Objective: Vital signs in last 24 hours: Filed Vitals:   05/05/12 2222 05/06/12 0500 05/06/12 1129 05/06/12 1442  BP: 145/65 160/69 155/62 135/57  Pulse: 82 91  84  Temp: 97.8 F (36.6 C) 98.5 F (36.9 C)  98 F (36.7 C)  TempSrc: Oral Oral  Oral  Resp: 20 20  20   Height: 5\' 10"  (1.778 m)     Weight: 68.539 kg (151 lb 1.6 oz) 68.448 kg (150 lb 14.4 oz)    SpO2: 100% 100%  98%    Intake/Output Summary (Last 24 hours) at 05/06/12 1640 Last data filed at 05/06/12 1225  Gross per 24 hour  Intake    840 ml  Output      0 ml  Net    840 ml    Weight change:   General: Alert, awake, oriented x3, in no acute distress. HEENT: No bruits, no goiter. Heart: Regular rate and rhythm, without murmurs, rubs, gallops. Lungs: Clear to auscultation bilaterally. Abdomen: Soft, nontender, nondistended, positive bowel sounds. Extremities: No clubbing cyanosis or edema with positive pedal pulses. Neuro: Grossly intact, nonfocal.    Lab Results:  Basename 05/06/12 1201 05/06/12 0656  NA 141 142  K 5.4* 5.3*  CL 113* 115*  CO2 17* 17*  GLUCOSE 83 84  BUN 44* 42*  CREATININE 4.25* 4.27*  CALCIUM 8.5 8.5  MG -- --  PHOS -- --   No results found for this basename: AST:2,ALT:2,ALKPHOS:2,BILITOT:2,PROT:2,ALBUMIN:2 in the last 72 hours No results found for this basename: LIPASE:2,AMYLASE:2 in the last 72 hours  Basename 05/06/12 0656 05/05/12 2303 05/05/12 1754  WBC 6.7 8.3 --  NEUTROABS -- -- 5.7  HGB 9.0* 9.4* --  HCT 26.1* 27.6* --  MCV 83.1 81.9 --  PLT 213 237 --    Basename 05/06/12 1503 05/06/12 0653 05/05/12 2302  CKTOTAL 57 58 70  CKMB 2.9 3.3 3.8  CKMBINDEX -- -- --  TROPONINI <0.30 <0.30 <0.30   No components found with this basename: POCBNP:3 No results found for this basename: DDIMER:2 in the last 72 hours No results found for  this basename: HGBA1C:2 in the last 72 hours No results found for this basename: CHOL:2,HDL:2,LDLCALC:2,TRIG:2,CHOLHDL:2,LDLDIRECT:2 in the last 72 hours  Basename 05/05/12 2303  TSH 1.631  T4TOTAL --  T3FREE --  THYROIDAB --   No results found for this basename: VITAMINB12:2,FOLATE:2,FERRITIN:2,TIBC:2,IRON:2,RETICCTPCT:2 in the last 72 hours  Micro Results: No results found for this or any previous visit (from the past 240 hour(s)).  Studies/Results: Dg Chest Port 1 View  05/05/2012  *RADIOLOGY REPORT*  Clinical Data: Chest pain.  PORTABLE CHEST - 1 VIEW  Comparison: Chest x-ray 10/07/2011.  Findings: Lung volumes are normal.  No consolidative airspace disease.  No pleural effusions.  No pneumothorax.  No pulmonary nodule or mass noted.  Pulmonary vasculature is normal.  Heart size is mildly enlarged (unchanged).  Mediastinal contours are unremarkable.  Atherosclerotic calcifications within the arch of the aorta.  IMPRESSION: 1.  Mild cardiomegaly is unchanged. 2.  Atherosclerosis. 3.  No definite radiographic evidence of acute cardiopulmonary disease.  Original Report Authenticated By: Etheleen Mayhew, M.D.    Medications:     . amLODipine  10 mg Oral Daily  . aspirin  81 mg Oral Daily  . carvedilol  6.25 mg Oral BID WC  . enoxaparin  30 mg  Subcutaneous Daily  . finasteride  5 mg Oral Daily  . hydrALAZINE  50 mg Oral Q8H  . isosorbide mononitrate  30 mg Oral Daily  . loratadine  10 mg Oral Daily  . NIFEdipine  60 mg Oral Daily  . nitroGLYCERIN  0.2 mg Transdermal Daily  . sodium bicarbonate  50 mEq Intravenous Once  . sodium bicarbonate  325 mg Oral BID  . sodium chloride  3 mL Intravenous Q12H  . sodium chloride  3 mL Intravenous Q12H  . sodium polystyrene  30 g Oral Once  . sodium polystyrene  45 g Oral Once  . sodium polystyrene  60 g Oral Once  . Tamsulosin HCl  0.4 mg Oral BID    Assessment: Principal Problem:  *Chest pain Active Problems:  Tobacco use  disorder  Hypertension  A-fib  ESRD (end stage renal disease)   Plan: #1 Chest Pain ??etiology.Patient with chest pain described as a pressure and midsternal and nonradiating. Currently chest pain free. Cardiac enzymes negative x 2. EKG with more pronounced t wave inversion in leads 2,3,AVF, V4-V6. Patient with hx MI 11/13. Will check FLP, 2 D ECHO and consult with cardiology for further evaluation and rxcs. Continue norvasc, ASA, coreg, imdur, procardia, nitro patch.   #2 Hyperkalemia Likely secondary to ESRD. Will give another dose of kayexalate and follow.  #3. HTN Continue current regimen  #4 Afib Currently rate controlled on coreg. ASA  #5 ESRD Per patient he was being considered to be placed on transplant list at the [place]. Will need to f/u outpatient. Continue sodium bicarbonate. Follow  #6 CAD/ Cardiomyopathy See #1. Continue coreg/hydralazine/ASA/procardia/Imdur  #7. Tobacco abuse Tobacco cessation.  #8. Hx CVA ASA   LOS: 1 day   [id number] 05/06/2012, 4:40 PM

## 2012-05-06 NOTE — H&P (Signed)
KELLAR CAMPI is an 66 y.o. male.   Chief Complaint: Chest pain HPI: This 66 year old gentleman with end-stage renal disease who was to [facility] hospital and was there today for evaluation of possible hemodialysis. Patient was waiting to be seen when he started having chest pain he became worried and left there after he came to our emergency room complaining of substernal chest pain. Rated as 5/10 centrally located more pressure and no radiation. He denied diaphoresis. No cough but he had mild shortness of breath. He denied any fever. Patient has risk factors for coronary artery disease including hypertension tobacco abuse. He is also known to have used cocaine in the past. He is chest pain-free now after use of nitroglycerin and morphine in the ED.  Past Medical History  Diagnosis Date  . HTN (hypertension)   . Anxiety   . Cardiomyopathy   . Renal insufficiency   . Myocardial infarction   . CVA (cerebral infarction)     Past Surgical History  Procedure Date  . Tee without cardioversion 10/09/2011    Procedure: TRANSESOPHAGEAL ECHOCARDIOGRAM (TEE);  Surgeon: Lelon Perla, MD;  Location: [facility] ENDOSCOPY;  Service: Cardiovascular;  Laterality: N/A;    Family History  Problem Relation Age of Onset  . Heart attack Mother     MI in her 20s  . Anesthesia problems Neg Hx   . Hypotension Neg Hx   . Malignant hyperthermia Neg Hx   . Pseudochol deficiency Neg Hx    Social History:  reports that he has been smoking Cigarettes.  He has a 25 pack-year smoking history. He does not have any smokeless tobacco history on file. He reports that he drinks alcohol. He reports that he uses illicit drugs ("Crack" cocaine) about twice per week.  Allergies: No Known Allergies  Medications Prior to Admission  Medication Sig Dispense Refill  . acetaminophen (TYLENOL) 325 MG tablet Take 650 mg by mouth every 6 (six) hours as needed. For pain      . amLODipine (NORVASC) 10 MG tablet Take 1 tablet (10 mg total) by  mouth daily.  30 tablet  11  . aspirin 81 MG chewable tablet Chew 81 mg by mouth daily.      . carvedilol (COREG) 6.25 MG tablet Take 6.25 mg by mouth 2 (two) times daily with a meal.        . finasteride (PROSCAR) 5 MG tablet Take 5 mg by mouth daily.      . hydrALAZINE (APRESOLINE) 50 MG tablet Take 1 tablet (50 mg total) by mouth 3 (three) times daily.  90 tablet  12  . isosorbide mononitrate (IMDUR) 30 MG 24 hr tablet Take 1 tablet (30 mg total) by mouth daily.      Marland Kitchen loratadine (CLARITIN) 10 MG tablet Take 10 mg by mouth daily.      Marland Kitchen NIFEdipine (PROCARDIA-XL/ADALAT CC) 60 MG 24 hr tablet Take 60 mg by mouth daily.      . nitroGLYCERIN (NITROSTAT) 0.4 MG SL tablet Place 0.4 mg under the tongue every 5 (five) minutes as needed. For chest pain       . sodium bicarbonate 325 MG tablet Take 325 mg by mouth 2 (two) times daily.      . Tamsulosin HCl (FLOMAX) 0.4 MG CAPS Take 0.4 mg by mouth 2 (two) times daily.        Results for orders placed during the hospital encounter of 05/05/12 (from the past 48 hour(s))  CBC  Status: Abnormal   Collection Time   05/05/12  5:54 PM      Component Value Range Comment   WBC 8.8  4.0 - 10.5 (K/uL)    RBC 3.30 (*) 4.22 - 5.81 (MIL/uL)    Hemoglobin 9.5 (*) 13.0 - 17.0 (g/dL)    HCT 27.3 (*) 39.0 - 52.0 (%)    MCV 82.7  78.0 - 100.0 (fL)    MCH 28.8  26.0 - 34.0 (pg)    MCHC 34.8  30.0 - 36.0 (g/dL)    RDW 13.5  11.5 - 15.5 (%)    Platelets 238  150 - 400 (K/uL)   DIFFERENTIAL     Status: Abnormal   Collection Time   05/05/12  5:54 PM      Component Value Range Comment   Neutrophils Relative 65  43 - 77 (%)    Neutro Abs 5.7  1.7 - 7.7 (K/uL)    Lymphocytes Relative 20  12 - 46 (%)    Lymphs Abs 1.8  0.7 - 4.0 (K/uL)    Monocytes Relative 8  3 - 12 (%)    Monocytes Absolute 0.7  0.1 - 1.0 (K/uL)    Eosinophils Relative 6 (*) 0 - 5 (%)    Eosinophils Absolute 0.5  0.0 - 0.7 (K/uL)    Basophils Relative 1  0 - 1 (%)    Basophils Absolute 0.1   0.0 - 0.1 (K/uL)   BASIC METABOLIC PANEL     Status: Abnormal   Collection Time   05/05/12  5:54 PM      Component Value Range Comment   Sodium 140  135 - 145 (mEq/L)    Potassium 5.6 (*) 3.5 - 5.1 (mEq/L)    Chloride 116 (*) 96 - 112 (mEq/L)    CO2 14 (*) 19 - 32 (mEq/L)    Glucose, Bld 116 (*) 70 - 99 (mg/dL)    BUN 45 (*) 6 - 23 (mg/dL)    Creatinine, Ser 4.20 (*) 0.50 - 1.35 (mg/dL)    Calcium 8.7  8.4 - 10.5 (mg/dL)    GFR calc non Af Amer 13 (*) >90 (mL/min)    GFR calc Af Amer 16 (*) >90 (mL/min)   TROPONIN I     Status: Normal   Collection Time   05/05/12  5:54 PM      Component Value Range Comment   Troponin I <0.30  <0.30 (ng/mL)   CARDIAC PANEL(CRET KIN+CKTOT+MB+TROPI)     Status: Normal   Collection Time   05/05/12 11:02 PM      Component Value Range Comment   Total CK 70  7 - 232 (U/L)    CK, MB 3.8  0.3 - 4.0 (ng/mL)    Troponin I <0.30  <0.30 (ng/mL)    Relative Index RELATIVE INDEX IS INVALID  0.0 - 2.5    CBC     Status: Abnormal   Collection Time   05/05/12 11:03 PM      Component Value Range Comment   WBC 8.3  4.0 - 10.5 (K/uL)    RBC 3.37 (*) 4.22 - 5.81 (MIL/uL)    Hemoglobin 9.4 (*) 13.0 - 17.0 (g/dL)    HCT 27.6 (*) 39.0 - 52.0 (%)    MCV 81.9  78.0 - 100.0 (fL)    MCH 27.9  26.0 - 34.0 (pg)    MCHC 34.1  30.0 - 36.0 (g/dL)    RDW 13.5  11.5 - 15.5 (%)  Platelets 237  150 - 400 (K/uL)   CREATININE, SERUM     Status: Abnormal   Collection Time   05/05/12 11:03 PM      Component Value Range Comment   Creatinine, Ser 4.37 (*) 0.50 - 1.35 (mg/dL)    GFR calc non Af Amer 13 (*) >90 (mL/min)    GFR calc Af Amer 15 (*) >90 (mL/min)    Dg Chest Port 1 View  05/05/2012  *RADIOLOGY REPORT*  Clinical Data: Chest pain.  PORTABLE CHEST - 1 VIEW  Comparison: Chest x-ray 10/07/2011.  Findings: Lung volumes are normal.  No consolidative airspace disease.  No pleural effusions.  No pneumothorax.  No pulmonary nodule or mass noted.  Pulmonary vasculature is normal.   Heart size is mildly enlarged (unchanged).  Mediastinal contours are unremarkable.  Atherosclerotic calcifications within the arch of the aorta.  IMPRESSION: 1.  Mild cardiomegaly is unchanged. 2.  Atherosclerosis. 3.  No definite radiographic evidence of acute cardiopulmonary disease.  Original Report Authenticated By: Etheleen Mayhew, M.D.    Review of Systems  HENT: Negative.   Eyes: Negative.   Respiratory: Positive for cough and shortness of breath.   Cardiovascular: Positive for chest pain, palpitations, orthopnea and PND.  Gastrointestinal: Negative.   Genitourinary: Negative.   Musculoskeletal: Negative.   Skin: Negative.   Neurological: Positive for weakness.  Endo/Heme/Allergies: Negative.   Psychiatric/Behavioral: Negative.     Blood pressure 145/65, pulse 82, temperature 97.8 F (36.6 C), temperature source Oral, resp. rate 20, height 5\' 10"  (1.778 m), weight 68.539 kg (151 lb 1.6 oz), SpO2 100.00%. Physical Exam  Constitutional: He is oriented to person, place, and time. He appears well-developed and well-nourished.  HENT:  Head: Normocephalic and atraumatic.  Right Ear: External ear normal.  Left Ear: External ear normal.  Nose: Nose normal.  Mouth/Throat: Oropharynx is clear and moist.  Eyes: Conjunctivae and EOM are normal. Pupils are equal, round, and reactive to light.  Neck: Normal range of motion. Neck supple.  Cardiovascular: Normal rate, regular rhythm, normal heart sounds and intact distal pulses.   Respiratory: Effort normal and breath sounds normal.  GI: Soft. Bowel sounds are normal.  Musculoskeletal: Normal range of motion.  Neurological: He is alert and oriented to person, place, and time. He has normal reflexes.  Skin: Skin is warm and dry.  Psychiatric: He has a normal mood and affect. His behavior is normal. Judgment and thought content normal.     Assessment/Plan This is a 66 year old male being admitted with chest pain and now end-stage renal  disease with no diaphoresis. Patient has hyperkalemia which is mild on admission. Plan #1 chest pain: Patient has risk factors for coronary artery disease so we'll admit him to telemetry bed check serial cardiac enzymes as well as given some nitroglycerin when necessary. We'll also give him some aspirin. Other causes of chest that will also be considered which may including his case probably some bronchitis, possible PE but less likely. #2 end-stage renal disease: Again patient has not started hemodialysis and is being evaluated for that. He will need a graft absolutely. We will defer that to his nephrologist at [facility] after discharge #3 hyperkalemia: Patient has received Kayexalate in the ER. His potassium was 5.6. Will follow the level and if it increases we'll repeat the Kayexalate and if this rising faster we may consider hemodialysis urgently. #4 tobacco abuse; counseling will be provided appropriate #5 hypertension: His blood pressure seems reasonably controlled at this point. #  6 atrial fibrillation: His weight is fully control and he seems to be in sinus rhythm at this point. 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  Riel Hirschman,LAWAL 05/06/2012, 1:05 AM

## 2012-05-06 NOTE — Progress Notes (Signed)
CARDIOLOGY CONSULT NOTE  Patient ID: Trevor [name] MRN: [id number] DOB/AGE: Feb 14, 1946 66 y.o.  Admit date: 05/05/2012 Primary Physician [facility] Primary Cardiologist Dr. Kirk Ruths Chief Complaint  Chest pain  HPI:   The patient first saw Dr. Stanford Breed in September of 2012 with symptoms concerning for angina. A cardiac catheterization was scheduled but this was canceled because screening laboratories showed significant renal insufficiency.  A Myoview was therefore performed in September of 2012 and revealed apical thinning, global hypokinesis and an ejection fraction of 40%. Renal dopplers revealed no RAS. Abdominal ultrasound was scheduled for bruit but patient did not show for the study. He has been managed medically for his cardiomyopathy.  TEE in Nov 2012 showed EF 45-50, mild LAE and no SOE. Carotid dopplers in Nov 2012 showed no significant stenosis bilaterally.  He presented to the ER with chest pain.  He was admitted for further evaluation of the chest pain as well as treatment of hyperkalemia and worsening renal function.  EKG as below.  Enzymes negative x 3.  He developed chest pain while driving back from a doctor's appt.  He described a weight in the right upper chest.  It was unlike any previous pain he can recall.  It was 8/10 and not like previous pain.  He called EMS and had improvement with NTG.  He reported some nausea and SOB.  No radiation.  Denies PND or orthopnea.  He reports residual right sided weakness since a CVA last year.    Past Medical History  Diagnosis Date  . HTN (hypertension)   . Anxiety   . Cardiomyopathy   . Renal insufficiency   . Myocardial infarction   . CVA (cerebral infarction)   . Chest pain   . Shortness of breath   . Stroke   . Headache   . Arthritis     Past Surgical History  Procedure Date  . Tee without cardioversion 10/09/2011    Procedure: TRANSESOPHAGEAL ECHOCARDIOGRAM (TEE);  Surgeon: Lelon Perla, MD;  Location: [facility]  ENDOSCOPY;  Service: Cardiovascular;  Laterality: N/A;  . Right hand     No Known Allergies Prescriptions prior to admission  Medication Sig Dispense Refill  . acetaminophen (TYLENOL) 325 MG tablet Take 650 mg by mouth every 6 (six) hours as needed. For pain      . amLODipine (NORVASC) 10 MG tablet Take 1 tablet (10 mg total) by mouth daily.  30 tablet  11  . aspirin 81 MG chewable tablet Chew 81 mg by mouth daily.      . carvedilol (COREG) 6.25 MG tablet Take 6.25 mg by mouth 2 (two) times daily with a meal.        . finasteride (PROSCAR) 5 MG tablet Take 5 mg by mouth daily.      . hydrALAZINE (APRESOLINE) 50 MG tablet Take 1 tablet (50 mg total) by mouth 3 (three) times daily.  90 tablet  12  . isosorbide mononitrate (IMDUR) 30 MG 24 hr tablet Take 1 tablet (30 mg total) by mouth daily.      Marland Kitchen loratadine (CLARITIN) 10 MG tablet Take 10 mg by mouth daily.      Marland Kitchen NIFEdipine (PROCARDIA-XL/ADALAT CC) 60 MG 24 hr tablet Take 60 mg by mouth daily.      . nitroGLYCERIN (NITROSTAT) 0.4 MG SL tablet Place 0.4 mg under the tongue every 5 (five) minutes as needed. For chest pain       . sodium bicarbonate 325 MG tablet  Take 325 mg by mouth 2 (two) times daily.      . Tamsulosin HCl (FLOMAX) 0.4 MG CAPS Take 0.4 mg by mouth 2 (two) times daily.       Family History  Problem Relation Age of Onset  . Heart attack Mother     MI in her 8s  . Anesthesia problems Neg Hx   . Hypotension Neg Hx   . Malignant hyperthermia Neg Hx   . Pseudochol deficiency Neg Hx     History   Social History  . Marital Status: Single    Spouse Name: N/A    Number of Children: 2  . Years of Education: N/A   Occupational History  . Not on file.   Social History Main Topics  . Smoking status: Current Everyday Smoker -- 0.5 packs/day for 50 years    Types: Cigarettes  . Smokeless tobacco: Never Used  . Alcohol Use: Yes     Occasional  . Drug Use: 2 per week    Special: "Crack" cocaine  . Sexually Active: Yes     Other Topics Concern  . Not on file   Social History Narrative  . No narrative on file     ROS:  As stated in the HPI and negative for all other systems.  Physical Exam: Blood pressure 135/57, pulse 84, temperature 98 F (36.7 C), temperature source Oral, resp. rate 20, height 5\' 10"  (1.778 m), weight 150 lb 14.4 oz (68.448 kg), SpO2 98.00%.  GENERAL:  Well appearing HEENT:  Pupils equal round and reactive, fundi not visualized, oral mucosa unremarkable NECK:  No jugular venous distention, waveform within normal limits, carotid upstroke brisk and symmetric, no bruits, no thyromegaly LYMPHATICS:  No cervical, inguinal adenopathy LUNGS:  Clear to auscultation bilaterally BACK:  No CVA tenderness CHEST:  Unremarkable HEART:  PMI not displaced or sustained,S1 and S2 within normal limits, no S3, no S4, no clicks, no rubs, no murmurs ABD:  Flat, positive bowel sounds normal in frequency in pitch, no bruits, no rebound, no guarding, no midline pulsatile mass, no hepatomegaly, no splenomegaly EXT:  2 plus pulses upper, absent DP/PT and popliteal bilateral with bilateral femoral bruits, no edema, no cyanosis no clubbing SKIN:  No rashes no nodules NEURO:  Cranial nerves II through XII grossly intact, motor grossly intact throughout PSYCH:  Cognitively intact, oriented to person place and time  Labs: Lab Results  Component Value Date   BUN 44* 05/06/2012   Lab Results  Component Value Date   CREATININE 4.25* 05/06/2012   Lab Results  Component Value Date   NA 141 05/06/2012   K 5.4* 05/06/2012   CL 113* 05/06/2012   CO2 17* 05/06/2012   Lab Results  Component Value Date   CKTOTAL 57 05/06/2012   CKMB 2.9 05/06/2012   TROPONINI <0.30 05/06/2012   Lab Results  Component Value Date   WBC 6.7 05/06/2012   HGB 9.0* 05/06/2012   HCT 26.1* 05/06/2012   MCV 83.1 05/06/2012   PLT 213 05/06/2012   Lab Results  Component Value Date   CHOL 148 10/08/2011   HDL 33* 10/08/2011   LDLCALC 93 10/08/2011    TRIG 111 10/08/2011   CHOLHDL 4.5 10/08/2011   Lab Results  Component Value Date   ALT 9 10/07/2011   AST 12 10/07/2011   ALKPHOS 95 10/07/2011   BILITOT 0.3 10/07/2011     Radiology:   CXR 1. Mild cardiomegaly is unchanged.  2. Atherosclerosis.  3.  No definite radiographic evidence of acute cardiopulmonary  disease.   EKG:   NSR rate 73.  LVH with repolarization changes.  No change from previous ECG.  05/06/2012  ASSESSMENT AND PLAN:   1)  Chest pain:  Typical and atypical features.  No objective evidence of ischemia and the EKG is not significantly changed from previous.  He had a stress test as above.  He cannot have a cath at this point although we would probably do this in the future if he gets a new kidney or goes on dialysis.  No further work up at this time.  2)  Cardiomyopathy:  Euvolemic by exam despite the BNP.  Continue with current therapy.  Note that he could not have his beta blocker increased in the future because of bradycardia.  3)  HTN:  BP controlled on current meds.    4)  CKD:   Acute on chronic.  Creat 4.2 (was 2.7). Plan per primary team.  5)  Anemia:  Hgb 9.5 (was 10.4)   Signed: Minus Breeding 05/06/2012, 5:11 PM

## 2012-05-06 NOTE — ED Provider Notes (Signed)
I saw and evaluated the patient, reviewed the resident's note and I agree with the findings and plan.  ESRD not yet on HD, substernal chest pain x 6 hours. No ST changes.  No hyperk EKG changes.  Ezequiel Essex, MD 05/06/12 [phone]

## 2012-05-06 NOTE — Progress Notes (Signed)
Patient sleep all night. With no c/o of chest pain, SOB, N/V. Vital signs and telemetry monitor were unremarkable. Nitro patch to the left arm still in place, UA sent with no difficulty voiding.  Will continue to monitor per shift

## 2012-05-07 DIAGNOSIS — N186 End stage renal disease: Secondary | ICD-10-CM

## 2012-05-07 DIAGNOSIS — R072 Precordial pain: Secondary | ICD-10-CM

## 2012-05-07 DIAGNOSIS — I517 Cardiomegaly: Secondary | ICD-10-CM

## 2012-05-07 DIAGNOSIS — F17200 Nicotine dependence, unspecified, uncomplicated: Secondary | ICD-10-CM

## 2012-05-07 DIAGNOSIS — I10 Essential (primary) hypertension: Secondary | ICD-10-CM

## 2012-05-07 LAB — LIPID PANEL
Cholesterol: 138 mg/dL (ref 0–200)
HDL: 37 mg/dL — ABNORMAL LOW (ref >39)
LDL Cholesterol: 78 mg/dL (ref 0–99)
Total CHOL/HDL Ratio: 3.7 RATIO
Triglycerides: 114 mg/dL (ref <150)
VLDL: 23 mg/dL (ref 0–40)

## 2012-05-07 LAB — BASIC METABOLIC PANEL
BUN: 39 mg/dL — ABNORMAL HIGH (ref 6–23)
CO2: 16 mEq/L — ABNORMAL LOW (ref 19–32)
Calcium: 8.6 mg/dL (ref 8.4–10.5)
Chloride: 116 mEq/L — ABNORMAL HIGH (ref 96–112)
Creatinine, Ser: 4.59 mg/dL — ABNORMAL HIGH (ref 0.50–1.35)
GFR calc Af Amer: 14 mL/min — ABNORMAL LOW (ref >90)
GFR calc non Af Amer: 12 mL/min — ABNORMAL LOW (ref >90)
Glucose, Bld: 70 mg/dL (ref 70–99)
Potassium: 4.1 mEq/L (ref 3.5–5.1)
Sodium: 145 mEq/L (ref 135–145)

## 2012-05-07 LAB — DRUGS OF ABUSE SCREEN W/O ALC, ROUTINE URINE
Barbiturate Quant, Ur: NEGATIVE
Cocaine Metabolites: NEGATIVE
Creatinine,U: 105.4 mg/dL
Opiate Screen, Urine: NEGATIVE
Phencyclidine (PCP): NEGATIVE
Propoxyphene: NEGATIVE

## 2012-05-07 MED ORDER — SODIUM BICARBONATE 650 MG PO TABS
650.0000 mg | ORAL_TABLET | Freq: Three times a day (TID) | ORAL | Status: DC
  Filled 2012-05-07 (×3): qty 1

## 2012-05-07 MED ORDER — SODIUM BICARBONATE 650 MG PO TABS
650.0000 mg | ORAL_TABLET | Freq: Two times a day (BID) | ORAL | Status: DC
Start: 2012-05-07 — End: 2012-05-08
  Administered 2012-05-07 – 2012-05-08 (×3): 650 mg via ORAL
  Filled 2012-05-07 (×4): qty 1

## 2012-05-07 NOTE — Progress Notes (Signed)
05/07/12 0220 Patient heart rate increased into the 130's to 140's sinus tachycardia nonsustained while ambulating to the bathroom . Once patient returned to bed his heart rated decreased to 108.  Patient remained A/O X 4 and was asymptomatic. MD on call was notified and made aware.

## 2012-05-07 NOTE — Progress Notes (Signed)
UR Completed. Llana Aliment, RN, Nurse Case Manager [phone]

## 2012-05-07 NOTE — Progress Notes (Signed)
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

## 2012-05-07 NOTE — Progress Notes (Signed)
  Echocardiogram 2D Echocardiogram has been performed.  Trevor [name] Deneen 05/07/2012, 9:36 AM

## 2012-05-07 NOTE — Progress Notes (Signed)
Subjective: Patient denies any current chest pain. Patient states at [facility] they have decided not to place on HD but to find a transplant. Patient states last GFR was 17.  Objective: Vital signs in last 24 hours: Filed Vitals:   05/07/12 0245 05/07/12 0608 05/07/12 0623 05/07/12 1413  BP: 153/83 152/86  154/69  Pulse: 108  110 85  Temp: 98.7 F (37.1 C)  98.5 F (36.9 C) 98.6 F (37 C)  TempSrc: Oral  Oral Oral  Resp: 18   20  Height:      Weight:   66.679 kg (147 lb)   SpO2: 99%  100% 98%    Intake/Output Summary (Last 24 hours) at 05/07/12 1720 Last data filed at 05/07/12 1328  Gross per 24 hour  Intake   1473 ml  Output    475 ml  Net    998 ml    Weight change: -1.86 kg (-4 lb 1.6 oz)  General: Alert, awake, oriented x3, in no acute distress. HEENT: No bruits, no goiter. Heart: Regular rate and rhythm, without murmurs, rubs, gallops. Lungs: Clear to auscultation bilaterally. Abdomen: Soft, nontender, nondistended, positive bowel sounds. Extremities: No clubbing cyanosis or edema with positive pedal pulses. Neuro: Grossly intact, nonfocal.    Lab Results:  Basename 05/07/12 0630 05/06/12 1201  NA 145 141  K 4.1 5.4*  CL 116* 113*  CO2 16* 17*  GLUCOSE 70 83  BUN 39* 44*  CREATININE 4.59* 4.25*  CALCIUM 8.6 8.5  MG -- --  PHOS -- --   No results found for this basename: AST:2,ALT:2,ALKPHOS:2,BILITOT:2,PROT:2,ALBUMIN:2 in the last 72 hours No results found for this basename: LIPASE:2,AMYLASE:2 in the last 72 hours  Basename 05/06/12 0656 05/05/12 2303 05/05/12 1754  WBC 6.7 8.3 --  NEUTROABS -- -- 5.7  HGB 9.0* 9.4* --  HCT 26.1* 27.6* --  MCV 83.1 81.9 --  PLT 213 237 --    Basename 05/06/12 1503 05/06/12 0653 05/05/12 2302  CKTOTAL 57 58 70  CKMB 2.9 3.3 3.8  CKMBINDEX -- -- --  TROPONINI <0.30 <0.30 <0.30   No components found with this basename: POCBNP:3 No results found for this basename: DDIMER:2 in the last 72 hours No results found for  this basename: HGBA1C:2 in the last 72 hours  Basename 05/07/12 0630  CHOL 138  HDL 37*  LDLCALC 78  TRIG 114  CHOLHDL 3.7  LDLDIRECT --    Basename 05/05/12 2303  TSH 1.631  T4TOTAL --  T3FREE --  THYROIDAB --   No results found for this basename: VITAMINB12:2,FOLATE:2,FERRITIN:2,TIBC:2,IRON:2,RETICCTPCT:2 in the last 72 hours  Micro Results: No results found for this or any previous visit (from the past 240 hour(s)).  Studies/Results: Dg Chest Port 1 View  05/05/2012  *RADIOLOGY REPORT*  Clinical Data: Chest pain.  PORTABLE CHEST - 1 VIEW  Comparison: Chest x-ray 10/07/2011.  Findings: Lung volumes are normal.  No consolidative airspace disease.  No pleural effusions.  No pneumothorax.  No pulmonary nodule or mass noted.  Pulmonary vasculature is normal.  Heart size is mildly enlarged (unchanged).  Mediastinal contours are unremarkable.  Atherosclerotic calcifications within the arch of the aorta.  IMPRESSION: 1.  Mild cardiomegaly is unchanged. 2.  Atherosclerosis. 3.  No definite radiographic evidence of acute cardiopulmonary disease.  Original Report Authenticated By: Etheleen Mayhew, M.D.    Medications:     . amLODipine  10 mg Oral Daily  . aspirin  81 mg Oral Daily  . carvedilol  6.25  mg Oral BID WC  . enoxaparin  30 mg Subcutaneous Daily  . finasteride  5 mg Oral Daily  . hydrALAZINE  50 mg Oral Q8H  . isosorbide mononitrate  30 mg Oral Daily  . loratadine  10 mg Oral Daily  . NIFEdipine  60 mg Oral Daily  . sodium bicarbonate  650 mg Oral BID  . sodium chloride  3 mL Intravenous Q12H  . sodium chloride  3 mL Intravenous Q12H  . sodium polystyrene  60 g Oral Once  . Tamsulosin HCl  0.4 mg Oral BID  . DISCONTD: nitroGLYCERIN  0.2 mg Transdermal Daily  . DISCONTD: sodium bicarbonate  325 mg Oral BID  . DISCONTD: sodium bicarbonate  650 mg Oral TID    Assessment: Principal Problem:  *Chest pain Active Problems:  Tobacco use disorder  Hypertension   A-fib  ESRD (end stage renal disease)   Plan: #1 Chest Pain ??etiology.Patient with chest pain described as a pressure and midsternal and nonradiating. Currently chest pain free. Cardiac enzymes negative x 2. EKG with more pronounced t wave inversion in leads 2,3,AVF, V4-V6. Patient with hx MI 11/13. 2 D ECHO with EF 60-65 %.  Continue norvasc, ASA, coreg, imdur, procardia,  D/c nitro patch. Cardiology ff and appreciate input and rxcs.  #2 Hyperkalemia Likely secondary to ESRD. Resolved  #3. HTN Continue current regimen  #4 Afib Currently rate controlled on coreg. ASA. Per cardiology cannot increase beta blocker any further secondary to bradycardia.  #5 ESRD Per patient he was being considered to be placed on transplant list at the [place]. Will need to f/u outpatient. Increase sodium bicarbonate. Follow  #6 CAD/ Cardiomyopathy See #1. Continue coreg/hydralazine/ASA/procardia/Imdur  #7. Tobacco abuse Tobacco cessation.  #8. Hx CVA ASA   LOS: 2 days   [id number] 05/07/2012, 5:20 PM

## 2012-05-08 DIAGNOSIS — N186 End stage renal disease: Secondary | ICD-10-CM

## 2012-05-08 DIAGNOSIS — F17200 Nicotine dependence, unspecified, uncomplicated: Secondary | ICD-10-CM

## 2012-05-08 DIAGNOSIS — I10 Essential (primary) hypertension: Secondary | ICD-10-CM

## 2012-05-08 DIAGNOSIS — R072 Precordial pain: Secondary | ICD-10-CM

## 2012-05-08 LAB — RENAL FUNCTION PANEL
Albumin: 2.8 g/dL — ABNORMAL LOW (ref 3.5–5.2)
Calcium: 8.6 mg/dL (ref 8.4–10.5)
Chloride: 110 mEq/L (ref 96–112)
Creatinine, Ser: 4.39 mg/dL — ABNORMAL HIGH (ref 0.50–1.35)
GFR calc non Af Amer: 13 mL/min — ABNORMAL LOW (ref >90)
Sodium: 139 mEq/L (ref 135–145)

## 2012-05-08 MED ORDER — SODIUM BICARBONATE 650 MG PO TABS: 650.0000 mg | ORAL_TABLET | Freq: Two times a day (BID) | ORAL | Status: DC

## 2012-05-08 NOTE — Discharge Summary (Signed)
Discharge Summary  TORIEN EMMETT MR#: [id number]  DOB:[birth year]  Date of Admission: 05/05/2012 Date of Discharge: 05/08/2012  Patient's PCP: Philis Fendt, MD, MD  Attending Physician:Quinton Voth  Consults: Treatment Team:  #1: Cardiology: Dr. Percival Spanish   Discharge Diagnoses: Chest pain Present on Admission:  .Hypertension .Chest pain .Tobacco use disorder .ESRD (end stage renal disease) .A-fib   Brief Admitting History and Physical This 66 year old gentleman with end-stage renal disease who was to [facility] hospital and was there today for evaluation of possible hemodialysis. Patient was waiting to be seen when he started having chest pain he became worried and left there after he came to our emergency room complaining of substernal chest pain. Rated as 5/10 centrally located more pressure and no radiation. He denied diaphoresis. No cough but he had mild shortness of breath. He denied any fever. Patient has risk factors for coronary artery disease including hypertension tobacco abuse. He is also known to have used cocaine in the past. He is chest pain-free now after use of nitroglycerin and morphine in the ED. For the rest of admission history and physical please see H&P dictated by Dr. Jonelle Sidle  Discharge Medications Medication List  As of 05/08/2012 11:38 AM   CHANGE how you take these medications         sodium bicarbonate 650 MG tablet   Take 1 tablet (650 mg total) by mouth 2 (two) times daily.   What changed: - medication strength - dose         CONTINUE taking these medications         acetaminophen 325 MG tablet   Commonly known as: TYLENOL      amLODipine 10 MG tablet   Commonly known as: NORVASC   Take 1 tablet (10 mg total) by mouth daily.      aspirin 81 MG chewable tablet      carvedilol 6.25 MG tablet   Commonly known as: COREG      finasteride 5 MG tablet   Commonly known as: PROSCAR      hydrALAZINE 50 MG tablet   Commonly known as: APRESOLINE   Take 1 tablet (50 mg total) by mouth 3 (three) times daily.      isosorbide mononitrate 30 MG 24 hr tablet   Commonly known as: IMDUR   Take 1 tablet (30 mg total) by mouth daily.      loratadine 10 MG tablet   Commonly known as: CLARITIN      NIFEdipine 60 MG 24 hr tablet   Commonly known as: PROCARDIA-XL/ADALAT CC      nitroGLYCERIN 0.4 MG SL tablet   Commonly known as: NITROSTAT      Tamsulosin HCl 0.4 MG Caps   Commonly known as: FLOMAX          Where to get your medications    These are the prescriptions that you need to pick up.   You may get these medications from any pharmacy.         sodium bicarbonate 650 MG tablet           Hospital Course: Chest pain Patient was admitted with chest pain to the telemetry floor. Patient did have a history of significant coronary artery disease. Cardiac enzymes were cycled which were negative x3. EKG which was obtained was similar to prior EKGs. A 2-D echo was also obtained which showed a normal left ventricular cavity size wall thickness was increased in a pattern of moderate LVH. systolic function was normal with  an EF of 60-65%. Due to patient's significant cardiac history I cardiology consultation was obtained. Patient was seen in consultation by Dr. Percival Spanish. It was felt per cardiology that patient's chest pain and had both typical and atypical features. Was felt that there was no evidence of ischemia an EKG had not significantly changed from his previous EKG. Patient did have a Myoview which was done in September of 2012 the review of apical thinning, global hypokinesis and an EF of 40%. Patient was being managed medically for his cardiomyopathy. Due to patient's chronic kidney disease it was felt that patient could not have a cardiac catheterization at this point but may need one in the future if he was to go on dialysis or get a new kidney. It was felt patient was euvolemic on exam. And no further cardiac workup was needed. Patient  remained in stable condition did not have any further chest pain throughout the rest of the hospitalization be discharged in stable and improved condition. Patient was maintained on his home regimen of medications. Patient's been instructed to followup with Crista Curb PA with [place] [facility] 4 weeks post discharge.  End-stage renal disease On admission patient did state that he was at the [place] initially to get a graft place and was told that he was being considered for transplant list. Patient due to his chest pain and presented to the hospital. Patient's creatinine on admission was approximately 4.20 and remained stable during the hospitalization. His discharge creatinine was 4.39. Patient was also noted to have a bicarbonate level which ranged from 14-17 and a such a sodium bicarbonate tablets were increased to 650 mg twice daily. Patient will followup with his nephrologist as outpatient.  The rest of patient's chronic medical issues remained stable throughout the hospitalization.  Present on Admission:  .Hypertension .Chest pain .Tobacco use disorder .ESRD (end stage renal disease) .A-fib   Day of Discharge BP 153/61  Pulse 87  Temp(Src) 98.6 F (37 C) (Oral)  Resp 18  Ht 5\' 10"  (1.778 m)  Wt 67.495 kg (148 lb 12.8 oz)  BMI 21.35 kg/m2  SpO2 97% Subjective: Patient denies any chest pain. No complaints. General: Alert, awake, oriented x3, in no acute distress. HEENT: No bruits, no goiter. Heart: Regular rate and rhythm, without murmurs, rubs, gallops. Lungs: Clear to auscultation bilaterally. Abdomen: Soft, nontender, nondistended, positive bowel sounds. Extremities: No clubbing cyanosis or edema with positive pedal pulses. Neuro: Grossly intact, nonfocal.   Results for orders placed during the hospital encounter of 05/05/12 (from the past 48 hour(s))  BASIC METABOLIC PANEL     Status: Abnormal   Collection Time   05/06/12 12:01 PM      Component Value Range Comment   Sodium  141  135 - 145 (mEq/L)    Potassium 5.4 (*) 3.5 - 5.1 (mEq/L)    Chloride 113 (*) 96 - 112 (mEq/L)    CO2 17 (*) 19 - 32 (mEq/L)    Glucose, Bld 83  70 - 99 (mg/dL)    BUN 44 (*) 6 - 23 (mg/dL)    Creatinine, Ser 4.25 (*) 0.50 - 1.35 (mg/dL)    Calcium 8.5  8.4 - 10.5 (mg/dL)    GFR calc non Af Amer 13 (*) >90 (mL/min)    GFR calc Af Amer 15 (*) >90 (mL/min)   CARDIAC PANEL(CRET KIN+CKTOT+MB+TROPI)     Status: Normal   Collection Time   05/06/12  3:03 PM      Component Value Range Comment  Total CK 57  7 - 232 (U/L)    CK, MB 2.9  0.3 - 4.0 (ng/mL)    Troponin I <0.30  <0.30 (ng/mL)    Relative Index RELATIVE INDEX IS INVALID  0.0 - 2.5    LIPID PANEL     Status: Abnormal   Collection Time   05/07/12  6:30 AM      Component Value Range Comment   Cholesterol 138  0 - 200 (mg/dL)    Triglycerides 114  <150 (mg/dL)    HDL 37 (*) >39 (mg/dL)    Total CHOL/HDL Ratio 3.7      VLDL 23  0 - 40 (mg/dL)    LDL Cholesterol 78  0 - 99 (mg/dL)   BASIC METABOLIC PANEL     Status: Abnormal   Collection Time   05/07/12  6:30 AM      Component Value Range Comment   Sodium 145  135 - 145 (mEq/L)    Potassium 4.1  3.5 - 5.1 (mEq/L)    Chloride 116 (*) 96 - 112 (mEq/L)    CO2 16 (*) 19 - 32 (mEq/L)    Glucose, Bld 70  70 - 99 (mg/dL)    BUN 39 (*) 6 - 23 (mg/dL)    Creatinine, Ser 4.59 (*) 0.50 - 1.35 (mg/dL)    Calcium 8.6  8.4 - 10.5 (mg/dL)    GFR calc non Af Amer 12 (*) >90 (mL/min)    GFR calc Af Amer 14 (*) >90 (mL/min)   RENAL FUNCTION PANEL     Status: Abnormal   Collection Time   05/08/12  5:24 AM      Component Value Range Comment   Sodium 139  135 - 145 (mEq/L)    Potassium 4.3  3.5 - 5.1 (mEq/L)    Chloride 110  96 - 112 (mEq/L)    CO2 16 (*) 19 - 32 (mEq/L)    Glucose, Bld 80  70 - 99 (mg/dL)    BUN 41 (*) 6 - 23 (mg/dL)    Creatinine, Ser 4.39 (*) 0.50 - 1.35 (mg/dL)    Calcium 8.6  8.4 - 10.5 (mg/dL)    Phosphorus 5.0 (*) 2.3 - 4.6 (mg/dL)    Albumin 2.8 (*) 3.5 - 5.2  (g/dL)    GFR calc non Af Amer 13 (*) >90 (mL/min)    GFR calc Af Amer 15 (*) >90 (mL/min)     Dg Chest Port 1 View  05/05/2012  *RADIOLOGY REPORT*  Clinical Data: Chest pain.  PORTABLE CHEST - 1 VIEW  Comparison: Chest x-ray 10/07/2011.  Findings: Lung volumes are normal.  No consolidative airspace disease.  No pleural effusions.  No pneumothorax.  No pulmonary nodule or mass noted.  Pulmonary vasculature is normal.  Heart size is mildly enlarged (unchanged).  Mediastinal contours are unremarkable.  Atherosclerotic calcifications within the arch of the aorta.  IMPRESSION: 1.  Mild cardiomegaly is unchanged. 2.  Atherosclerosis. 3.  No definite radiographic evidence of acute cardiopulmonary disease.  Original Report Authenticated By: Etheleen Mayhew, M.D.     Disposition: Home  Diet: Low sodium heart healthy diet  Activity: Increase activity slowly   Follow-up Appts: Discharge Orders    Future Orders Please Complete By Expires   Diet - low sodium heart healthy      Increase activity slowly      Discharge instructions      Comments:   Follow up with AVBUERE,EDWIN A, MD, in 1 week Follow  up with Kidney doctor at [facility] as scheduled. Follow up with Richardson Dopp( [place] cardi[facility]) in 4 weeks       TESTS THAT NEED FOLLOW-UP Will need a BMET on followup to monitor renal function and bicarbonate levels.  Time spent on discharge, talking to the patient, and coordinating care: 50 mins.   SignedIrine Seal 05/08/2012, 11:38 AM

## 2012-07-08 ENCOUNTER — Telehealth: Payer: Self-pay | Admitting: Cardiology

## 2012-07-08 NOTE — Telephone Encounter (Signed)
New Problem:     I called the patient and was unable to reach them. I left a message on their voicemail with my name, the reason I called, the name of his physician, and a number to call back to schedule their appointment. 

## 2012-09-08 ENCOUNTER — Ambulatory Visit (INDEPENDENT_AMBULATORY_CARE_PROVIDER_SITE_OTHER): Payer: Medicare Other | Admitting: Physician Assistant

## 2012-09-08 ENCOUNTER — Encounter: Payer: Self-pay | Admitting: Physician Assistant

## 2012-09-08 VITALS — BP 195/96 | HR 98 | Ht 70.0 in | Wt 147.1 lb

## 2012-09-08 DIAGNOSIS — I509 Heart failure, unspecified: Secondary | ICD-10-CM

## 2012-09-08 DIAGNOSIS — I4891 Unspecified atrial fibrillation: Secondary | ICD-10-CM

## 2012-09-08 DIAGNOSIS — R079 Chest pain, unspecified: Secondary | ICD-10-CM

## 2012-09-08 DIAGNOSIS — N183 Chronic kidney disease, stage 3 unspecified: Secondary | ICD-10-CM

## 2012-09-08 DIAGNOSIS — I10 Essential (primary) hypertension: Secondary | ICD-10-CM

## 2012-09-08 DIAGNOSIS — I5022 Chronic systolic (congestive) heart failure: Secondary | ICD-10-CM

## 2012-09-08 DIAGNOSIS — F17200 Nicotine dependence, unspecified, uncomplicated: Secondary | ICD-10-CM

## 2012-09-08 MED ORDER — CARVEDILOL 12.5 MG PO TABS: 12.5000 mg | ORAL_TABLET | Freq: Two times a day (BID) | ORAL | Status: DC

## 2012-09-08 MED ORDER — NITROGLYCERIN 0.4 MG SL SUBL: 0.4000 mg | SUBLINGUAL_TABLET | SUBLINGUAL | Status: DC | PRN

## 2012-09-08 NOTE — Assessment & Plan Note (Signed)
Still smoking 2 cigarettes daily. Advised to quit smoking.

## 2012-09-08 NOTE — Progress Notes (Signed)
HPI:  This is a 66 year old African American male patient who has history of end-stage renal disease, cardiomyopathy ejection fraction 40% with myoview in September 2012 revealing apical thinning and global hypokinesis EF 40%. He has not had a cardiac catheterization because of his kidney disease. The patient just returned from the [place] this morning and has not taken his medications today. 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 His brother was helping him with this but has not been around for about a month and a half. The patient can't see his pills to know what he is taking. He has complained of shortness of breath for the past 3 or 4 weeks. He says he gets out of breath with walking down the hall. He also has had some chest pain when walking too far and he has run out of nitroglycerin. The pain usually eases when he stops. He continues to smoke one or 2 cigarettes daily for anxiety but has not used cocaine recently.   No Known Allergies  Current Outpatient Prescriptions on File Prior to Visit: acetaminophen (TYLENOL) 325 MG tablet, Take 650 mg by mouth every 6 (six) hours as needed. For pain, Disp: , Rfl:  aspirin 81 MG chewable tablet, Chew 81 mg by mouth daily., Disp: , Rfl:  carvedilol (COREG) 6.25 MG tablet, Take 6.25 mg by mouth 2 (two) times daily with a meal.  , Disp: , Rfl:  cloNIDine (CATAPRES) 0.1 MG tablet, Take 0.1 mg by mouth 2 (two) times daily., Disp: , Rfl:  finasteride (PROSCAR) 5 MG tablet, Take 5 mg by mouth daily., Disp: , Rfl:  hydrALAZINE (APRESOLINE) 50 MG tablet, Take 1 tablet (50 mg total) by mouth 3 (three) times daily., Disp: 90 tablet, Rfl: 12 isosorbide mononitrate (IMDUR) 30 MG 24 hr tablet, Take 1 tablet (30 mg total) by mouth daily., Disp: , Rfl:  loratadine (CLARITIN) 10 MG tablet, Take 10 mg by mouth daily., Disp: , Rfl:  NIFEdipine (PROCARDIA-XL/ADALAT CC) 60 MG 24 hr tablet, Take 90 mg by mouth daily. , Disp: , Rfl:  nitroGLYCERIN (NITROSTAT) 0.4 MG SL  tablet, Place 0.4 mg under the tongue every 5 (five) minutes as needed. For chest pain , Disp: , Rfl:  sodium bicarbonate 650 MG tablet, Take 1 tablet (650 mg total) by mouth 2 (two) times daily., Disp: 62 tablet, Rfl: 0 Tamsulosin HCl (FLOMAX) 0.4 MG CAPS, Take 0.4 mg by mouth 2 (two) times daily., Disp: , Rfl:  terazosin (HYTRIN) 2 MG capsule, Take 2 mg by mouth at bedtime., Disp: , Rfl:  DISCONTD: amLODipine (NORVASC) 10 MG tablet, Take 1 tablet (10 mg total) by mouth daily., Disp: 30 tablet, Rfl: 11    Past Medical History:   HTN (hypertension)                                           Anxiety                                                      Cardiomyopathy  Renal insufficiency                                          Myocardial infarction                                        CVA (cerebral infarction)                                    Chest pain                                                   Shortness of breath                                          Stroke                                                       Headache                                                     Arthritis                                                   Past Surgical History:   TEE WITHOUT CARDIOVERSION                       10/09/2011      Comment:Procedure: TRANSESOPHAGEAL ECHOCARDIOGRAM               (TEE);  Surgeon: Lelon Perla, MD;                Location: [facility] ENDOSCOPY;  Service:               Cardiovascular;  Laterality: N/A;   right hand                                                  Review of patient's family history indicates:   Heart attack                   Mother                     Comment: MI in her 46s   Anesthesia problems            Neg Hx  Hypotension                    Neg Hx                   Malignant hyperthermia         Neg Hx                   Pseudochol deficiency          Neg Hx                    Social History   Marital Status: Single              Spouse Name:                      Years of Education:                 Number of children: 2           Occupational History   None on file  Social History Main Topics   Smoking Status: Current Every Day Smoker        Packs/Day: .5    Years: 50        Types: Cigarettes   Smokeless Status: Never Used                       Alcohol Use: Yes               Comment: Occasional   Drug Use: Yes           Frequency: 2    per week      Special: "Crack" cocaine   Sexual Activity: Yes                Other Topics            Concern   None on file  Social History Narrative   None on file    ROS:see the history of present illness   PHYSICAL EXAM: Well-nournished, in no acute distress. Neck: No JVD, HJR, Bruit, or thyroid enlargement  Lungs: Decreased breath sounds throughout,No tachypnea, clear without wheezing, rales, or rhonchi  Cardiovascular: RRR, PMI not displaced, 2/6 systolic murmur at the left border, no gallops, bruit, thrill, or heave.  Abdomen: BS normal. Soft without organomegaly, masses, lesions or tenderness.  Extremities: without cyanosis, clubbing or edema. Good distal pulses bilateral  SKin: Warm, no lesions or rashes   Musculoskeletal: No deformities  Neuro: no focal signs  BP 195/96  Pulse 98  Ht 5\' 10"  (1.778 m)  Wt 147 lb 1.9 oz (66.733 kg)  BMI 21.11 kg/m2   [id number] rhythm with LVH with repolarization changes the T wave inversion throughout, prolonged QT 394 ms

## 2012-09-08 NOTE — Assessment & Plan Note (Signed)
The patient has about one month history of dyspnea on exertion. There is no evidence of heart failure on exam today. I suspect his symptoms are coming from lack of medications. He does not know what he is taking because he cannot see his pills and has run out of several. [facility] to [facility] to [facility] and have a pharmacist sort his medications out for him. I will increase his Coreg to 12.5 mg b.i.d. And give him a prescription for nitroglycerin. He is not a candidate for cardiac catheterization at this point with his end-stage renal disease. Continue medical therapy.

## 2012-09-08 NOTE — Assessment & Plan Note (Signed)
Patient has exertional chest tightness but has been off his medications. Stress Myoview in September 2012 did not show ischemia. At this point he is not a candidate for cardiac catheterization with his end-stage renal disease. Refill his nitroglycerin and increase his Coreg. Followup with Dr. Stanford Breed.

## 2012-09-08 NOTE — Assessment & Plan Note (Signed)
Patient's blood pressure is elevated today but he has not been taking his medications properly. He is to get this sorted out at [facility] today. I have increased his Coreg.

## 2012-09-08 NOTE — Assessment & Plan Note (Signed)
Patient currently followed at the [facility] but trying to get switched to [place] [facility].

## 2012-09-08 NOTE — Patient Instructions (Addendum)
Your physician has recommended you make the following change in your medication: increase Coreg (Carvedilolo) to 12.5 mg twice daily  Your physician recommends that you schedule a follow-up appointment in: 2 months with Dr. Stanford Breed

## 2012-09-17 ENCOUNTER — Encounter (HOSPITAL_COMMUNITY): Payer: Self-pay | Admitting: Emergency Medicine

## 2012-09-17 ENCOUNTER — Emergency Department (HOSPITAL_COMMUNITY): Payer: Medicare HMO

## 2012-09-17 ENCOUNTER — Inpatient Hospital Stay (HOSPITAL_COMMUNITY)
Admission: EM | Admit: 2012-09-17 | Discharge: 2012-10-01 | DRG: 673 | Disposition: A | Discharge status: Skilled Nursing Facility | Payer: Medicare HMO | Attending: Internal Medicine | Admitting: Internal Medicine

## 2012-09-17 DIAGNOSIS — Z91199 Patient's noncompliance with other medical treatment and regimen due to unspecified reason: Secondary | ICD-10-CM

## 2012-09-17 DIAGNOSIS — D631 Anemia in chronic kidney disease: Secondary | ICD-10-CM | POA: Diagnosis present

## 2012-09-17 DIAGNOSIS — F1410 Cocaine abuse, uncomplicated: Secondary | ICD-10-CM

## 2012-09-17 DIAGNOSIS — I5043 Acute on chronic combined systolic (congestive) and diastolic (congestive) heart failure: Secondary | ICD-10-CM | POA: Diagnosis present

## 2012-09-17 DIAGNOSIS — N289 Disorder of kidney and ureter, unspecified: Secondary | ICD-10-CM | POA: Diagnosis present

## 2012-09-17 DIAGNOSIS — I161 Hypertensive emergency: Secondary | ICD-10-CM

## 2012-09-17 DIAGNOSIS — Z7982 Long term (current) use of aspirin: Secondary | ICD-10-CM

## 2012-09-17 DIAGNOSIS — Z8673 Personal history of transient ischemic attack (TIA), and cerebral infarction without residual deficits: Secondary | ICD-10-CM

## 2012-09-17 DIAGNOSIS — Z8249 Family history of ischemic heart disease and other diseases of the circulatory system: Secondary | ICD-10-CM

## 2012-09-17 DIAGNOSIS — E875 Hyperkalemia: Secondary | ICD-10-CM | POA: Diagnosis present

## 2012-09-17 DIAGNOSIS — F411 Generalized anxiety disorder: Secondary | ICD-10-CM | POA: Diagnosis present

## 2012-09-17 DIAGNOSIS — F17200 Nicotine dependence, unspecified, uncomplicated: Secondary | ICD-10-CM | POA: Diagnosis present

## 2012-09-17 DIAGNOSIS — Y9210 Unspecified residential institution as the place of occurrence of the external cause: Secondary | ICD-10-CM | POA: Diagnosis present

## 2012-09-17 DIAGNOSIS — I9589 Other hypotension: Secondary | ICD-10-CM | POA: Diagnosis not present

## 2012-09-17 DIAGNOSIS — I509 Heart failure, unspecified: Secondary | ICD-10-CM | POA: Diagnosis present

## 2012-09-17 DIAGNOSIS — I252 Old myocardial infarction: Secondary | ICD-10-CM

## 2012-09-17 DIAGNOSIS — I428 Other cardiomyopathies: Secondary | ICD-10-CM | POA: Diagnosis present

## 2012-09-17 DIAGNOSIS — D649 Anemia, unspecified: Secondary | ICD-10-CM

## 2012-09-17 DIAGNOSIS — N179 Acute kidney failure, unspecified: Secondary | ICD-10-CM | POA: Diagnosis present

## 2012-09-17 DIAGNOSIS — Z9119 Patient's noncompliance with other medical treatment and regimen: Secondary | ICD-10-CM

## 2012-09-17 DIAGNOSIS — I498 Other specified cardiac arrhythmias: Secondary | ICD-10-CM | POA: Diagnosis present

## 2012-09-17 DIAGNOSIS — R079 Chest pain, unspecified: Secondary | ICD-10-CM

## 2012-09-17 DIAGNOSIS — I5022 Chronic systolic (congestive) heart failure: Secondary | ICD-10-CM

## 2012-09-17 DIAGNOSIS — I959 Hypotension, unspecified: Secondary | ICD-10-CM

## 2012-09-17 DIAGNOSIS — I2510 Atherosclerotic heart disease of native coronary artery without angina pectoris: Secondary | ICD-10-CM | POA: Diagnosis present

## 2012-09-17 DIAGNOSIS — I5023 Acute on chronic systolic (congestive) heart failure: Secondary | ICD-10-CM | POA: Diagnosis not present

## 2012-09-17 DIAGNOSIS — N186 End stage renal disease: Secondary | ICD-10-CM | POA: Diagnosis present

## 2012-09-17 DIAGNOSIS — E872 Acidosis, unspecified: Secondary | ICD-10-CM | POA: Diagnosis present

## 2012-09-17 DIAGNOSIS — B1920 Unspecified viral hepatitis C without hepatic coma: Secondary | ICD-10-CM | POA: Diagnosis present

## 2012-09-17 DIAGNOSIS — R0989 Other specified symptoms and signs involving the circulatory and respiratory systems: Secondary | ICD-10-CM

## 2012-09-17 DIAGNOSIS — Z79899 Other long term (current) drug therapy: Secondary | ICD-10-CM

## 2012-09-17 DIAGNOSIS — T448X5A Adverse effect of centrally-acting and adrenergic-neuron-blocking agents, initial encounter: Secondary | ICD-10-CM | POA: Diagnosis present

## 2012-09-17 DIAGNOSIS — N2581 Secondary hyperparathyroidism of renal origin: Secondary | ICD-10-CM | POA: Diagnosis present

## 2012-09-17 DIAGNOSIS — I639 Cerebral infarction, unspecified: Secondary | ICD-10-CM

## 2012-09-17 DIAGNOSIS — R5381 Other malaise: Secondary | ICD-10-CM | POA: Diagnosis not present

## 2012-09-17 DIAGNOSIS — I4891 Unspecified atrial fibrillation: Secondary | ICD-10-CM | POA: Diagnosis present

## 2012-09-17 DIAGNOSIS — R001 Bradycardia, unspecified: Secondary | ICD-10-CM

## 2012-09-17 DIAGNOSIS — I120 Hypertensive chronic kidney disease with stage 5 chronic kidney disease or end stage renal disease: Principal | ICD-10-CM | POA: Diagnosis present

## 2012-09-17 LAB — CBC WITH DIFFERENTIAL/PLATELET
Basophils Absolute: 0.1 10*3/uL (ref 0.0–0.1)
Basophils Relative: 2 % — ABNORMAL HIGH (ref 0–1)
HCT: 22.8 % — ABNORMAL LOW (ref 39.0–52.0)
Lymphocytes Relative: 18 % (ref 12–46)
MCHC: 33.3 g/dL (ref 30.0–36.0)
Monocytes Absolute: 0.4 10*3/uL (ref 0.1–1.0)
Neutro Abs: 3.9 10*3/uL (ref 1.7–7.7)
Neutrophils Relative %: 66 % (ref 43–77)
Platelets: 198 10*3/uL (ref 150–400)
RDW: 14.6 % (ref 11.5–15.5)
WBC: 5.9 10*3/uL (ref 4.0–10.5)

## 2012-09-17 LAB — POCT I-STAT, CHEM 8
Creatinine, Ser: 7 mg/dL — ABNORMAL HIGH (ref 0.50–1.35)
HCT: 23 % — ABNORMAL LOW (ref 39.0–52.0)
Hemoglobin: 7.8 g/dL — ABNORMAL LOW (ref 13.0–17.0)
Sodium: 144 mEq/L (ref 135–145)
TCO2: 14 mmol/L (ref 0–100)

## 2012-09-17 LAB — RAPID URINE DRUG SCREEN, HOSP PERFORMED
Barbiturates: NOT DETECTED
Opiates: NOT DETECTED
Tetrahydrocannabinol: NOT DETECTED

## 2012-09-17 LAB — PROTIME-INR: Prothrombin Time: 15.2 seconds (ref 11.6–15.2)

## 2012-09-17 LAB — BASIC METABOLIC PANEL
BUN: 70 mg/dL — ABNORMAL HIGH (ref 6–23)
Calcium: 8.9 mg/dL (ref 8.4–10.5)
GFR calc Af Amer: 8 mL/min — ABNORMAL LOW (ref >90)
GFR calc non Af Amer: 7 mL/min — ABNORMAL LOW (ref >90)
Glucose, Bld: 85 mg/dL (ref 70–99)
Sodium: 141 mEq/L (ref 135–145)

## 2012-09-17 LAB — RETICULOCYTES
RBC.: 2.63 MIL/uL — ABNORMAL LOW (ref 4.22–5.81)
Retic Count, Absolute: 65.8 10*3/uL (ref 19.0–186.0)
Retic Ct Pct: 2.5 % (ref 0.4–3.1)

## 2012-09-17 LAB — RENAL FUNCTION PANEL
Albumin: 3.3 g/dL — ABNORMAL LOW (ref 3.5–5.2)
Chloride: 114 mEq/L — ABNORMAL HIGH (ref 96–112)
GFR calc non Af Amer: 7 mL/min — ABNORMAL LOW (ref >90)
Potassium: 5.6 mEq/L — ABNORMAL HIGH (ref 3.5–5.1)

## 2012-09-17 MED ORDER — SODIUM POLYSTYRENE SULFONATE 15 GM/60ML PO SUSP
15.0000 g | Freq: Once | ORAL | Status: AC
  Administered 2012-09-17: 15 g via ORAL
  Filled 2012-09-17: qty 60

## 2012-09-17 MED ORDER — ACETAMINOPHEN 650 MG RE SUPP: 650.0000 mg | Freq: Four times a day (QID) | RECTAL | Status: DC | PRN

## 2012-09-17 MED ORDER — SODIUM CHLORIDE 0.9 % IJ SOLN
3.0000 mL | Freq: Two times a day (BID) | INTRAMUSCULAR | Status: DC
  Administered 2012-09-18 – 2012-09-26 (×15): 3 mL via INTRAVENOUS

## 2012-09-17 MED ORDER — ONDANSETRON HCL 4 MG PO TABS
4.0000 mg | ORAL_TABLET | Freq: Four times a day (QID) | ORAL | Status: DC | PRN
  Administered 2012-09-18: 4 mg via ORAL
  Filled 2012-09-17: qty 1

## 2012-09-17 MED ORDER — LABETALOL HCL 5 MG/ML IV SOLN
20.0000 mg | Freq: Once | INTRAVENOUS | Status: AC
  Administered 2012-09-17: 20 mg via INTRAVENOUS
  Filled 2012-09-17: qty 4

## 2012-09-17 MED ORDER — SODIUM CHLORIDE 0.9 % IV SOLN
INTRAVENOUS | Status: DC
  Administered 2012-09-17 (×2): via INTRAVENOUS

## 2012-09-17 MED ORDER — ACETAMINOPHEN 325 MG PO TABS: 650.0000 mg | ORAL_TABLET | Freq: Four times a day (QID) | ORAL | Status: DC | PRN

## 2012-09-17 MED ORDER — HYDRALAZINE HCL 20 MG/ML IJ SOLN
4.0000 mg | Freq: Four times a day (QID) | INTRAMUSCULAR | Status: DC | PRN
  Administered 2012-09-17: 4 mg via INTRAVENOUS
  Filled 2012-09-17: qty 1
  Filled 2012-09-17: qty 0.2

## 2012-09-17 MED ORDER — ONDANSETRON HCL 4 MG/2ML IJ SOLN: 4.0000 mg | Freq: Four times a day (QID) | INTRAMUSCULAR | Status: DC | PRN

## 2012-09-17 NOTE — ED Notes (Signed)
Pt reports his BP is high so he came to ED. Pt c/o SOB. Pt denies CP but reports he has some in the past.

## 2012-09-17 NOTE — ED Provider Notes (Signed)
History     CSN: [id number]  Arrival date & time 09/17/12  78   First MD Initiated Contact with Patient 09/17/12 1525      Chief Complaint  Patient presents with  . Hypertension    (Consider location/radiation/quality/duration/timing/severity/associated sxs/prior treatment) The history is provided by the patient.   patient presents with hypertension. He is on numerous medicines already. He's had some episodes of chest pain, however not in the last few days. His blood pressure has been high despite his medicines. He states he has renal problems if it gets any worse again after dialysis. No lightheadedness or dizziness.  Past Medical History  Diagnosis Date  . HTN (hypertension)   . Anxiety   . Cardiomyopathy   . Renal insufficiency   . Myocardial infarction   . CVA (cerebral infarction)   . Chest pain   . Shortness of breath   . Stroke   . Headache   . Arthritis     Past Surgical History  Procedure Date  . Tee without cardioversion 10/09/2011    Procedure: TRANSESOPHAGEAL ECHOCARDIOGRAM (TEE);  Surgeon: Lelon Perla, MD;  Location: [facility] ENDOSCOPY;  Service: Cardiovascular;  Laterality: N/A;  . Right hand     Family History  Problem Relation Age of Onset  . Heart attack Mother     MI in her 42s  . Anesthesia problems Neg Hx   . Hypotension Neg Hx   . Malignant hyperthermia Neg Hx   . Pseudochol deficiency Neg Hx     History  Substance Use Topics  . Smoking status: Current Every Day Smoker -- 0.5 packs/day for 50 years    Types: Cigarettes  . Smokeless tobacco: Never Used  . Alcohol Use: Yes     Occasional      Review of Systems  Constitutional: Negative for activity change and appetite change.  HENT: Negative for neck stiffness.   Eyes: Negative for pain.  Respiratory: Negative for chest tightness and shortness of breath.   Cardiovascular: Positive for chest pain. Negative for leg swelling.  Gastrointestinal: Negative for nausea, vomiting,  abdominal pain and diarrhea.  Genitourinary: Negative for flank pain.  Musculoskeletal: Negative for back pain.  Skin: Negative for rash.  Neurological: Negative for weakness, numbness and headaches.  Psychiatric/Behavioral: Negative for behavioral problems.    Allergies  Review of patient's allergies indicates no known allergies.  Home Medications   No current outpatient prescriptions on file.  BP 175/88  Pulse 80  Temp 97.4 F (36.3 C) (Oral)  Resp 18  Ht 5\' 10"  (1.778 m)  Wt 148 lb 2.4 oz (67.2 kg)  BMI 21.26 kg/m2  SpO2 100%  Physical Exam  Nursing note and vitals reviewed. Constitutional: He is oriented to person, place, and time. He appears well-developed and well-nourished.  HENT:  Head: Normocephalic and atraumatic.  Eyes: EOM are normal. Pupils are equal, round, and reactive to light.  Neck: Normal range of motion. Neck supple.  Cardiovascular: Normal rate, regular rhythm and normal heart sounds.   No murmur heard. Pulmonary/Chest: Effort normal and breath sounds normal.  Abdominal: Soft. Bowel sounds are normal. He exhibits no distension and no mass. There is no tenderness. There is no rebound and no guarding.  Musculoskeletal: Normal range of motion. He exhibits no edema.  Neurological: He is alert and oriented to person, place, and time. No cranial nerve deficit.  Skin: Skin is warm and dry.  Psychiatric: He has a normal mood and affect.    ED Course  Procedures (including critical care time)  Labs Reviewed  CBC WITH DIFFERENTIAL - Abnormal; Notable for the following:    RBC 2.67 (*)     Hemoglobin 7.6 (*)     HCT 22.8 (*)     Eosinophils Relative 8 (*)     Basophils Relative 2 (*)     All other components within normal limits  POCT I-STAT, CHEM 8 - Abnormal; Notable for the following:    Potassium 5.6 (*)     Chloride 118 (*)     BUN 71 (*)     Creatinine, Ser 7.00 (*)     Hemoglobin 7.8 (*)     HCT 23.0 (*)     All other components within  normal limits  BASIC METABOLIC PANEL - Abnormal; Notable for the following:    Potassium 6.1 (*)     Chloride 113 (*)     CO2 12 (*)     BUN 70 (*)     Creatinine, Ser 7.14 (*)     GFR calc non Af Amer 7 (*)     GFR calc Af Amer 8 (*)     All other components within normal limits  RENAL FUNCTION PANEL - Abnormal; Notable for the following:    Potassium 5.6 (*)     Chloride 114 (*)     CO2 12 (*)     BUN 68 (*)     Creatinine, Ser 6.92 (*)     Calcium 8.2 (*)     Phosphorus 5.8 (*)     Albumin 3.3 (*)     GFR calc non Af Amer 7 (*)     GFR calc Af Amer 9 (*)     All other components within normal limits  IRON AND TIBC - Abnormal; Notable for the following:    Iron 32 (*)     Saturation Ratios 12 (*)     All other components within normal limits  RETICULOCYTES - Abnormal; Notable for the following:    RBC. 2.63 (*)     All other components within normal limits  PROTIME-INR  TROPONIN I  OCCULT BLOOD, POC DEVICE  URINE RAPID DRUG SCREEN (HOSP PERFORMED)  VITAMIN B12  FOLATE  FERRITIN  BASIC METABOLIC PANEL  CBC   Dg Chest 2 View  09/17/2012  *RADIOLOGY REPORT*  Clinical Data: Hypertension.  CHEST - 2 VIEW  Comparison: 05/05/2012.  Findings: The heart, mediastinum and hilar contours are normal. Clear and fully expand. There is slight thickening along the fissures in the lateral projection, likely chronic.  There are no acute bony changes. There is no evidence of effusions or pneumothoraces.  IMPRESSION: No active disease.   Original Report Authenticated By: Joaquim Lai, M.D.      1. ESRD (end stage renal disease)   2. Hypertensive emergency   3. Anemia   4. Hypertensive emergency without congestive heart failure   5. Renal insufficiency   6. Systolic CHF, chronic   7. Chest pain   8. Tobacco use disorder   9. Bruit   10. Stroke   11. Cocaine abuse   12. Severe sinus bradycardia   13. Acute renal failure   14. Chronic kidney disease (CKD), stage III  (moderate)   15. A-fib   16. Cardiomyopathy       MDM  Patient presents with hypertension and worsening renal failure. Also anemia and guaiac negative. He's had renal insufficiency that has recently worsened. His hypertension his family made him come  in before. He was given IV doses of labetalol to control his hypertensive emergency. His potassium is mildly elevated. She'll be admitted to medicine for further evaluation        Jasper Riling. Alvino Chapel, MD 09/18/12 [phone]

## 2012-09-17 NOTE — ED Notes (Signed)
Chronic bp - [facility] Taking bp meds. Also, sob.

## 2012-09-17 NOTE — H&P (Addendum)
[facility] History and Physical  Trevor [name] [id number] DOB: Jul 20, 1946 DOA: 09/17/2012  Referring physician: Dr. Alvino Chapel PCP: William Hamburger, MD  Specialists: None  Chief Complaint: Elevated blood pressure  HPI: Trevor [name] is a 66 y.o. male  With history of end-stage renal disease (followed by nephrologist in [place]), cardiomyopathy ejection fraction 40% with myoview in September 2012 revealing apical thinning and global hypokinesis EF 40%. He has not had a cardiac catheterization because of his kidney disease.  Presented to the hospital today because his blood pressure was elevated at home.  He has had history of non compliance with recommended blood pressure regimen and he mentions that he wanted to wean himself off of his medications because he felt over medicated.  He has also had difficulty seeing which pill is what and he has his brother and [employer] nurse help him with his medications at home.  He denies any melena, BRBPR, hematuria, headaches, or abdominal discomfort.  In the ED was found to have elevated blood pressures with highest documented at 188/97 and patient was subsequently given labetalol.  Chest x ray showed no active disease.  Creatinine was 7.0 and hemoglobin was 7.8 with negative FOCT. We were asked to evaluate patient for admission evaluation given elevated BP's, Anemia, and elevated creatinine.   Review of Systems: The patient denies anorexia, fever, weight loss,, vision loss, decreased hearing, hoarseness, chest pain, syncope, dyspnea on exertion, peripheral edema, balance deficits, hemoptysis, abdominal pain, melena, hematochezia, severe indigestion/heartburn, hematuria, incontinence, genital sores, muscle weakness, suspicious skin lesions, difficulty walking,  depression, unusual weight change, abnormal bleeding, enlarged lymph nodes, angioedema, and breast masses.    Past Medical History  Diagnosis Date  . HTN (hypertension)   . Anxiety   .  Cardiomyopathy   . Renal insufficiency   . Myocardial infarction   . CVA (cerebral infarction)   . Chest pain   . Shortness of breath   . Stroke   . Headache   . Arthritis    Past Surgical History  Procedure Date  . Tee without cardioversion 10/09/2011    Procedure: TRANSESOPHAGEAL ECHOCARDIOGRAM (TEE);  Surgeon: Lelon Perla, MD;  Location: [facility] ENDOSCOPY;  Service: Cardiovascular;  Laterality: N/A;  . Right hand    Social History:  reports that he has been smoking Cigarettes.  He has a 25 pack-year smoking history. He has never used smokeless tobacco. He reports that he drinks alcohol. He reports that he uses illicit drugs ("Crack" cocaine) about twice per week. Pt lives at home  Can patient participate in ADLs? yes  No Known Allergies  Family History  Problem Relation Age of Onset  . Heart attack Mother     MI in her 52s  . Anesthesia problems Neg Hx   . Hypotension Neg Hx   . Malignant hyperthermia Neg Hx   . Pseudochol deficiency Neg Hx   Brother has diabetes  Prior to Admission medications   Medication Sig Start Date End Date Taking? Authorizing Provider  acetaminophen (TYLENOL) 325 MG tablet Take 650 mg by mouth every 6 (six) hours as needed. For pain   Yes Historical Provider, MD  amLODipine (NORVASC) 10 MG tablet Take 10 mg by mouth daily. 08/07/11   Lelon Perla, MD  aspirin 81 MG chewable tablet Chew 81 mg by mouth daily.    Historical Provider, MD  carvedilol (COREG) 12.5 MG tablet Take 1 tablet (12.5 mg total) by mouth 2 (two) times daily with a meal. 09/08/12   Selinda Eon  Gerrianne Scale, PA  cloNIDine (CATAPRES) 0.1 MG tablet Take 0.1 mg by mouth 2 (two) times daily.    Historical Provider, MD  finasteride (PROSCAR) 5 MG tablet Take 5 mg by mouth daily.    Historical Provider, MD  hydrALAZINE (APRESOLINE) 50 MG tablet Take 1 tablet (50 mg total) by mouth 3 (three) times daily. 11/27/11 11/26/12  Lelon Perla, MD  isosorbide mononitrate (IMDUR) 30 MG 24 hr tablet  Take 1 tablet (30 mg total) by mouth daily. 10/10/11 10/09/12  Debbe Odea, MD  loratadine (CLARITIN) 10 MG tablet Take 10 mg by mouth daily.    Historical Provider, MD  NIFEdipine (PROCARDIA-XL/ADALAT CC) 60 MG 24 hr tablet Take 90 mg by mouth daily.     Historical Provider, MD  nitroGLYCERIN (NITROSTAT) 0.4 MG SL tablet Place 1 tablet (0.4 mg total) under the tongue every 5 (five) minutes as needed. For chest pain 09/08/12   Imogene Burn, PA  sodium bicarbonate 650 MG tablet Take 1 tablet (650 mg total) by mouth 2 (two) times daily. 05/08/12 05/08/13  Eugenie Filler, MD  Tamsulosin HCl (FLOMAX) 0.4 MG CAPS Take 0.4 mg by mouth 2 (two) times daily.    Historical Provider, MD  terazosin (HYTRIN) 2 MG capsule Take 2 mg by mouth at bedtime.    Historical Provider, MD   Physical Exam: Filed Vitals:   09/17/12 1308 09/17/12 1547 09/17/12 1604  BP: 188/97 188/102 166/86  Pulse: 96    TempSrc: Oral    Resp: 20 22   Height: 5\' 10"  (1.778 m)    Weight: 66.679 kg (147 lb)    SpO2: 100% 100%      General:  Pt in NAD, Alert and Awake  Eyes: EOMI, PERRLA  ENT: no masses on visual inspection, normal exterior appearance  Neck: supple, no goiter  Cardiovascular: RRR, No MRG  Respiratory: CTA BL, no wheezes  Abdomen: Soft, NT, ND  Skin: warm and dry  Musculoskeletal: no cyanosis or clubbing  Psychiatric: mood and affect appropriate  Neurologic: answers questions appropriately, moves all extremities  Labs on Admission:  Basic Metabolic Panel:  Lab [id number] 09/17/12 1408  NA 141 144  K 6.1* 5.6*  CL 113* 118*  CO2 12* --  GLUCOSE 85 78  BUN 70* 71*  CREATININE 7.14* 7.00*  CALCIUM 8.9 --  MG -- --  PHOS -- --   Liver Function Tests: No results found for this basename: AST:5,ALT:5,ALKPHOS:5,BILITOT:5,PROT:5,ALBUMIN:5 in the last 168 hours No results found for this basename: LIPASE:5,AMYLASE:5 in the last 168 hours No results found for this basename: AMMONIA:5 in the  last 168 hours CBC:  Lab 09/17/12 1408 09/17/12 1327  WBC -- 5.9  NEUTROABS -- 3.9  HGB 7.8* 7.6*  HCT 23.0* 22.8*  MCV -- 85.4  PLT -- 198   Cardiac Enzymes: No results found for this basename: CKTOTAL:5,CKMB:5,CKMBINDEX:5,TROPONINI:5 in the last 168 hours  BNP (last 3 results)  Basename 05/05/12 2302  PROBNP 32672.0*   CBG: No results found for this basename: GLUCAP:5 in the last 168 hours  Radiological Exams on Admission: Dg Chest 2 View  09/17/2012  *RADIOLOGY REPORT*  Clinical Data: Hypertension.  CHEST - 2 VIEW  Comparison: 05/05/2012.  Findings: The heart, mediastinum and hilar contours are normal. Clear and fully expand. There is slight thickening along the fissures in the lateral projection, likely chronic.  There are no acute bony changes. There is no evidence of effusions or pneumothoraces.  IMPRESSION: No active disease.  Original Report Authenticated By: Joaquim Lai, M.D.     EKG: Independently reviewed. Normal sinus rhythm with diffuse T wave inversion. No ST elevation  Assessment/Plan Active Problems:  HTN emergency  Anemia  Acute on chronic renal failureCKD 4-5 ESRD  Tobacco use disorder  Chest pain   1. HTN emergency - Pt given labetalol in ED - check UDS - Restart home regimen and reassess blood pressure next am - Most likely causing Acute on Chronic Renal failure - Pt has history of cocaine abuse at this point will check UDS and for the moment avoid B blockers.  2. Acute on chronic renal failure - Most likely due to elevated blood pressure - Will gently hydrate - order renal panel - good blood pressure control - If no improvement in creatinine would consider Nephrology consult for further recommendations next am - U/S of kidneys to assess for obstructive cause contributing to current clinical picture.  3. Anemia - Most likely anemia of chronic disease 2 ary to # 2. - FOCT negative - consider transfusion if hgb drops below 7.8 on  next check next am.  4. Tobacco Use - recommended cessation - provide nicotine patch  5. Chest pain - cardiomyopathy ejection fraction 40% with myoview in September 2012 revealing apical thinning and global hypokinesis EF 40% - Based on last clinic notes patient was not a surgical candidate (cath candidate) due to ESRD.  Addendum: 6 Hyperkalemia - Administer kayexalate  - recheck level in AM - consulted Nephrology  Code Status: Full Family Communication: No family at bedside Disposition Plan: Pending clinical improvement  Time spent: > 55 minutes  Moriah Shawley, [facility] Pager [phone]  If 7PM-7AM, please contact night-coverage [email or web address] Password [facility] 09/17/2012, 4:47 PM

## 2012-09-18 ENCOUNTER — Encounter (HOSPITAL_COMMUNITY): Payer: Self-pay | Admitting: *Deleted

## 2012-09-18 DIAGNOSIS — F17200 Nicotine dependence, unspecified, uncomplicated: Secondary | ICD-10-CM

## 2012-09-18 DIAGNOSIS — I959 Hypotension, unspecified: Secondary | ICD-10-CM

## 2012-09-18 DIAGNOSIS — R079 Chest pain, unspecified: Secondary | ICD-10-CM

## 2012-09-18 LAB — CBC
Hemoglobin: 7.6 g/dL — ABNORMAL LOW (ref 13.0–17.0)
MCHC: 34.1 g/dL (ref 30.0–36.0)
RBC: 2.67 MIL/uL — ABNORMAL LOW (ref 4.22–5.81)
WBC: 8.7 10*3/uL (ref 4.0–10.5)

## 2012-09-18 LAB — CBC WITH DIFFERENTIAL/PLATELET
Basophils Absolute: 0.1 10*3/uL (ref 0.0–0.1)
Eosinophils Relative: 13 % — ABNORMAL HIGH (ref 0–5)
Lymphocytes Relative: 22 % (ref 12–46)
Neutro Abs: 3.2 10*3/uL (ref 1.7–7.7)
Platelets: 162 10*3/uL (ref 150–400)
RDW: 14.7 % (ref 11.5–15.5)
WBC: 5.6 10*3/uL (ref 4.0–10.5)

## 2012-09-18 LAB — BLOOD GAS, ARTERIAL
Bicarbonate: 10.2 mEq/L — ABNORMAL LOW (ref 20.0–24.0)
FIO2: 0.21 %
Patient temperature: 98.6
pCO2 arterial: 22.3 mmHg — ABNORMAL LOW (ref 35.0–45.0)
pH, Arterial: 7.281 — ABNORMAL LOW (ref 7.350–7.450)

## 2012-09-18 LAB — BASIC METABOLIC PANEL
BUN: 73 mg/dL — ABNORMAL HIGH (ref 6–23)
Chloride: 113 mEq/L — ABNORMAL HIGH (ref 96–112)
Creatinine, Ser: 6.97 mg/dL — ABNORMAL HIGH (ref 0.50–1.35)
GFR calc Af Amer: 8 mL/min — ABNORMAL LOW (ref >90)
GFR calc non Af Amer: 7 mL/min — ABNORMAL LOW (ref >90)
GFR calc non Af Amer: 8 mL/min — ABNORMAL LOW (ref >90)
Glucose, Bld: 309 mg/dL — ABNORMAL HIGH (ref 70–99)
Potassium: 5 mEq/L (ref 3.5–5.1)
Sodium: 135 mEq/L (ref 135–145)

## 2012-09-18 LAB — ABO/RH: ABO/RH(D): B POS

## 2012-09-18 LAB — MRSA PCR SCREENING: MRSA by PCR: POSITIVE — AB

## 2012-09-18 LAB — HEMOGLOBIN AND HEMATOCRIT, BLOOD
HCT: 19.9 % — ABNORMAL LOW (ref 39.0–52.0)
Hemoglobin: 6.9 g/dL — CL (ref 13.0–17.0)

## 2012-09-18 LAB — FERRITIN: Ferritin: 234 ng/mL (ref 22–322)

## 2012-09-18 LAB — PREPARE RBC (CROSSMATCH)

## 2012-09-18 MED ORDER — HYDRALAZINE HCL 50 MG PO TABS
50.0000 mg | ORAL_TABLET | Freq: Three times a day (TID) | ORAL | Status: DC
  Administered 2012-09-18: 50 mg via ORAL
  Filled 2012-09-18 (×5): qty 1

## 2012-09-18 MED ORDER — ISOSORBIDE MONONITRATE ER 30 MG PO TB24
30.0000 mg | ORAL_TABLET | Freq: Every day | ORAL | Status: DC
  Administered 2012-09-18: 30 mg via ORAL
  Filled 2012-09-18: qty 1

## 2012-09-18 MED ORDER — TAMSULOSIN HCL 0.4 MG PO CAPS
0.4000 mg | ORAL_CAPSULE | Freq: Two times a day (BID) | ORAL | Status: DC
  Administered 2012-09-18 – 2012-10-01 (×28): 0.4 mg via ORAL
  Filled 2012-09-18 (×29): qty 1

## 2012-09-18 MED ORDER — SODIUM CHLORIDE 0.9 % IV BOLUS (SEPSIS)
1000.0000 mL | Freq: Once | INTRAVENOUS | Status: AC
  Administered 2012-09-18: 1000 mL via INTRAVENOUS

## 2012-09-18 MED ORDER — AMLODIPINE BESYLATE 10 MG PO TABS
10.0000 mg | ORAL_TABLET | Freq: Every day | ORAL | Status: DC
  Administered 2012-09-18: 10 mg via ORAL
  Filled 2012-09-18: qty 1

## 2012-09-18 MED ORDER — TERAZOSIN HCL 2 MG PO CAPS
2.0000 mg | ORAL_CAPSULE | Freq: Every day | ORAL | Status: DC
  Filled 2012-09-18 (×2): qty 1

## 2012-09-18 MED ORDER — NIFEDIPINE ER OSMOTIC RELEASE 90 MG PO TB24
90.0000 mg | ORAL_TABLET | Freq: Every day | ORAL | Status: DC
  Administered 2012-09-18: 90 mg via ORAL
  Filled 2012-09-18: qty 1

## 2012-09-18 MED ORDER — DOPAMINE-DEXTROSE 3.2-5 MG/ML-% IV SOLN
5.0000 ug/kg/min | INTRAVENOUS | Status: DC
  Administered 2012-09-18: 5 ug/kg/min via INTRAVENOUS
  Filled 2012-09-18: qty 250

## 2012-09-18 MED ORDER — SODIUM BICARBONATE 8.4 % IV SOLN
50.0000 meq | Freq: Once | INTRAVENOUS | Status: AC
  Administered 2012-09-19: 50 meq via INTRAVENOUS
  Filled 2012-09-18: qty 50

## 2012-09-18 MED ORDER — CLONIDINE HCL 0.1 MG PO TABS
0.1000 mg | ORAL_TABLET | Freq: Two times a day (BID) | ORAL | Status: DC
  Administered 2012-09-18: 0.1 mg via ORAL
  Filled 2012-09-18 (×3): qty 1

## 2012-09-18 MED ORDER — SODIUM CHLORIDE 0.9 % IV BOLUS (SEPSIS)
500.0000 mL | Freq: Once | INTRAVENOUS | Status: DC
  Administered 2012-09-18: 500 mL via INTRAVENOUS

## 2012-09-18 MED ORDER — NITROGLYCERIN 0.4 MG SL SUBL: 0.4000 mg | SUBLINGUAL_TABLET | SUBLINGUAL | Status: DC | PRN

## 2012-09-18 MED ORDER — LORATADINE 10 MG PO TABS
10.0000 mg | ORAL_TABLET | Freq: Every day | ORAL | Status: DC
  Administered 2012-09-18 – 2012-10-01 (×14): 10 mg via ORAL
  Filled 2012-09-18 (×14): qty 1

## 2012-09-18 MED ORDER — SODIUM BICARBONATE 650 MG PO TABS
1300.0000 mg | ORAL_TABLET | Freq: Three times a day (TID) | ORAL | Status: DC
  Administered 2012-09-19 – 2012-09-26 (×21): 1300 mg via ORAL
  Filled 2012-09-18 (×24): qty 2

## 2012-09-18 MED ORDER — ASPIRIN 81 MG PO CHEW
81.0000 mg | CHEWABLE_TABLET | Freq: Every day | ORAL | Status: DC
  Administered 2012-09-18 – 2012-09-26 (×9): 81 mg via ORAL
  Filled 2012-09-18 (×10): qty 1

## 2012-09-18 MED ORDER — DOPAMINE-DEXTROSE 3.2-5 MG/ML-% IV SOLN: 10.0000 ug/kg/min | INTRAVENOUS | Status: DC

## 2012-09-18 MED ORDER — SODIUM CHLORIDE 0.9 % IJ SOLN
3.0000 mL | Freq: Two times a day (BID) | INTRAMUSCULAR | Status: DC
  Administered 2012-09-19 – 2012-09-30 (×14): 3 mL via INTRAVENOUS
  Administered 2012-10-01: 6 mL via INTRAVENOUS

## 2012-09-18 MED ORDER — GLUCAGON HCL (RDNA) 1 MG IJ SOLR
5.0000 mg | Freq: Once | INTRAVENOUS | Status: AC
  Administered 2012-09-18: 5 mg via INTRAVENOUS
  Filled 2012-09-18: qty 5

## 2012-09-18 MED ORDER — FINASTERIDE 5 MG PO TABS
5.0000 mg | ORAL_TABLET | Freq: Every day | ORAL | Status: DC
  Administered 2012-09-18 – 2012-10-01 (×14): 5 mg via ORAL
  Filled 2012-09-18 (×14): qty 1

## 2012-09-18 MED ORDER — SODIUM BICARBONATE 650 MG PO TABS
650.0000 mg | ORAL_TABLET | Freq: Two times a day (BID) | ORAL | Status: DC
  Administered 2012-09-18: 650 mg via ORAL
  Filled 2012-09-18 (×4): qty 1

## 2012-09-18 MED ORDER — SODIUM CHLORIDE 0.9 % IV SOLN
INTRAVENOUS | Status: DC
  Administered 2012-09-18: 18:00:00 via INTRAVENOUS

## 2012-09-18 MED ORDER — CARVEDILOL 12.5 MG PO TABS
12.5000 mg | ORAL_TABLET | Freq: Two times a day (BID) | ORAL | Status: DC
  Administered 2012-09-18: 12.5 mg via ORAL
  Filled 2012-09-18 (×3): qty 1

## 2012-09-18 NOTE — Progress Notes (Addendum)
Was called to evaluate patient because after he had received 1 unit of PRBC's he had a blood pressure reading of 83/49.  I ordered patient to stop getting transfusion and 500 cc fluid bolus.  Repeat blood pressure still low.  2nd normal saline 500cc fluid bolus ordered.   Patient's blood pressure home regimen was continued today and his blood pressure this morning was recorded at 196/114 and as such I added back his coreg to his regimen.    I have held patient's blood pressure medication and will transition patient to stepdown unit for further monitoring.  Velvet Bathe  Currently I am at bedside personally evaluating patient.  Addendum  Gen: Patient is somnolent but arrousable, laying supine in bed CV: S1 and S2, no MRG, 2 second capillary refil Pulm: CTA BL, no wheezes Abdomen: non distended  Will provide 1 liter fluid bolus.  Strict I/O's

## 2012-09-18 NOTE — Progress Notes (Signed)
[facility] PROGRESS NOTE  Trevor [name] [id number] DOB: 1946-09-21 DOA: 09/17/2012 PCP: William Hamburger, MD  Assessment/Plan: 1. HTN emergency - Hydralazine 4 mg IV PRN SBP > 180 or DBP> 110  - UDS was negative.  Will resume B blocker - Restart home regimen and reassess blood pressure next am  - Most likely contributing to Acute on Chronic Renal failure   2. Acute on chronic renal failure  - Most likely due to elevated blood pressure  - Will gently hydrate  - order renal panel  - good blood pressure control  - If no improvement in creatinine would consider Nephrology consult for further recommendations next am  - U/S of kidneys to assess for obstructive cause contributing to current clinical picture. Currently order in place and pending.  Will f/u with results.  3. Anemia  - Most likely anemia of chronic disease 2 ary to # 2.  - FOCT negative  - Transfuse 2 units of PRBC's   4. Tobacco Use  - recommended cessation  - provide nicotine patch   5. Chest pain  - cardiomyopathy ejection fraction 40% with myoview in September 2012 revealing apical thinning and global hypokinesis EF 40%  - Based on last clinic notes patient was not a surgical candidate (cath candidate) due to ESRD.   6 Hyperkalemia  - Administered kayexalate yesterday - Level this am  4.9 - consulted Nephrology   Code Status: full Family Communication: No family at bedside Disposition Plan: Once blood pressures are better controlled will plan on discharging if OK with nephrology   Consultants:  Nephrology  Procedures:  Renal U/S  Antibiotics:  None  HPI/Subjective: Patient has no new complaints.  Mentions that he feels better today.  No acute issues overnight.  Denies any active bleeding.  Objective: Filed Vitals:   09/17/12 1845 09/17/12 1915 09/17/12 2254 09/18/12 0507  BP: 188/102 182/90 175/88 170/83  Pulse: 80 80  98  Temp:  97.4 F (36.3 C)  98.6 F (37 C)  TempSrc:   Oral  Oral  Resp:  18  16  Height:  5\' 10"  (1.778 m)    Weight:  67.2 kg (148 lb 2.4 oz)    SpO2: 100% 100%  98%    Intake/Output Summary (Last 24 hours) at 09/18/12 0854 Last data filed at 09/18/12 0644  Gross per 24 hour  Intake      0 ml  Output    550 ml  Net   -550 ml   Filed Weights   09/17/12 1308 09/17/12 1915  Weight: 66.679 kg (147 lb) 67.2 kg (148 lb 2.4 oz)    Exam:   General:  Pt in NAD, A and O x 3  Cardiovascular: RRR, No MRG  Respiratory: CTA BL, no wheezes  Abdomen: Soft, NT, ND  Data Reviewed: Basic Metabolic Panel:  Lab [id number] 09/17/12 1753 09/17/12 1548 09/17/12 1408  NA 141 140 141 144  K 4.9 5.6* 6.1* 5.6*  CL 115* 114* 113* 118*  CO2 12* 12* 12* --  GLUCOSE 86 88 85 78  BUN 73* 68* 70* 71*  CREATININE 6.97* 6.92* 7.14* 7.00*  CALCIUM 7.9* 8.2* 8.9 --  MG -- -- -- --  PHOS -- 5.8* -- --   Liver Function Tests:  Lab 09/17/12 1753  AST --  ALT --  ALKPHOS --  BILITOT --  PROT --  ALBUMIN 3.3*   No results found for this basename: LIPASE:5,AMYLASE:5 in the last 168 hours No results found  for this basename: AMMONIA:5 in the last 168 hours CBC:  Lab 09/18/12 0545 09/17/12 1408 09/17/12 1327  WBC 5.6 -- 5.9  NEUTROABS 3.2 -- 3.9  HGB 7.1* 7.8* 7.6*  HCT 20.4* 23.0* 22.8*  MCV 85.4 -- 85.4  PLT 162 -- 198   Cardiac Enzymes:  Lab 09/17/12 1610  CKTOTAL --  CKMB --  CKMBINDEX --  TROPONINI <0.30   BNP (last 3 results)  Basename 05/05/12 2302  PROBNP 32672.0*   CBG: No results found for this basename: GLUCAP:5 in the last 168 hours  No results found for this or any previous visit (from the past 240 hour(s)).   Studies: Dg Chest 2 View  09/17/2012  *RADIOLOGY REPORT*  Clinical Data: Hypertension.  CHEST - 2 VIEW  Comparison: 05/05/2012.  Findings: The heart, mediastinum and hilar contours are normal. Clear and fully expand. There is slight thickening along the fissures in the lateral projection, likely chronic.   There are no acute bony changes. There is no evidence of effusions or pneumothoraces.  IMPRESSION: No active disease.   Original Report Authenticated By: Joaquim Lai, M.D.     Scheduled Meds:   . amLODipine  10 mg Oral Daily  . aspirin  81 mg Oral Daily  . carvedilol  12.5 mg Oral BID WC  . cloNIDine  0.1 mg Oral BID  . finasteride  5 mg Oral Daily  . hydrALAZINE  50 mg Oral Q8H  . isosorbide mononitrate  30 mg Oral Daily  . labetalol  20 mg Intravenous Once  . loratadine  10 mg Oral Daily  . NIFEdipine  90 mg Oral Daily  . sodium bicarbonate  650 mg Oral BID  . sodium chloride  3 mL Intravenous Q12H  . sodium chloride  3 mL Intravenous Q12H  . sodium polystyrene  15 g Oral Once  . Tamsulosin HCl  0.4 mg Oral BID  . terazosin  2 mg Oral QHS   Continuous Infusions:   . sodium chloride 50 mL/hr at 09/17/12 2258    Active Problems:  Hypertensive emergency without congestive heart failure  Renal insufficiency  Systolic CHF, chronic  ESRD (end stage renal disease)    Time spent: > 35 minutes    Velvet Bathe  [facility] Pager [phone]. If 8PM-8AM, please contact night-coverage at [email or web address], password [facility] 09/18/2012, 8:54 AM  LOS: 1 day

## 2012-09-18 NOTE — Consult Note (Signed)
Cardiology IP Consult  Reason for Consult: Hypotension Referring Physician: Wendee Beavers  HPI: Trevor [name] is a 66 y.o.male with nonischemic cardiomyopathy, CKD IV, and poorly controlled hypertension who presented to the hospital yesterday because his BP was elevated at home.  He was given IV labetalol in the ED for BP 188/97.  He was also found to be anemic and with worsening renal function (BUN 39->70 and Cr 4.5->7).  He was admitted to the [facility] service.  His home medications were restarted today and he was given a blood transfusion this afternoon.  Shortly after starting transfusion he became hypotensive.  He did not have fever or any other symptoms.  The transfusion was discontinued but his BP did not improve.  He was given a total of 2L NS bolus without improvement in his BP which was 90/45 when I evaluated the patient.  The patient is somewhat somnolent but tells me that he has had trouble taking his medications at home due to poor vision.  He takes only some of his medications on a very irregular basis.  He is also having mild substernal chest pressure that began a few minutes ago. I started on low dose dopamine with improvement of his pressure and resolution of these symptoms.    PMH:  1. Nonischemic CM:   - lexiscan myoview in 08/2011 with EF 40%, no ischemia  - echo 05/2012 LVEF 60-65%, moderate LVH 2. HTN 3. CKD IV 4. Substance Abuse: cocaine 5. CVA 6. pAF                               Past Surgical History  Procedure Date  . Tee without cardioversion 10/09/2011    Procedure: TRANSESOPHAGEAL ECHOCARDIOGRAM (TEE);  Surgeon: Lelon Perla, MD;  Location: [facility] END[facility]COPY;  Service: Cardiovascular;  Laterality: N/A;  . Right hand     Family History  Problem Relation Age of Onset  . Heart attack Mother     MI in her 53s  . Anesthesia problems Neg Hx   . Hypotension Neg Hx   . Malignant hyperthermia Neg Hx   . Pseudochol deficiency Neg Hx     Social History:  reports that he  has been smoking Cigarettes.  He has a 25 pack-year smoking history. He has never used smokeless tobacco. He reports that he drinks alcohol. He reports that he uses illicit drugs ("Crack" cocaine) about twice per week.  Allergies: No Known Allergies  Current Facility-Administered Medications  Medication Dose Route Frequency Provider Last Rate Last Dose  . 0.9 %  sodium chloride infusion   Intravenous Continuous Velvet Bathe, MD 75 mL/hr at 09/18/12 1827    . acetaminophen (TYLENOL) tablet 650 mg  650 mg Oral Q6H PRN Velvet Bathe, MD       Or  . acetaminophen (TYLENOL) suppository 650 mg  650 mg Rectal Q6H PRN Velvet Bathe, MD      . aspirin chewable tablet 81 mg  81 mg Oral Daily Ritta Slot, NP   81 mg at 09/18/12 1000  . DOPamine (INTROPIN) 800 mg in dextrose 5 % 250 mL infusion  10 mcg/kg/min Intravenous Titrated Aletta Edouard, MD 13.3 mL/hr at 09/18/12 2000 10 mcg/kg/min at 09/18/12 2000  . finasteride (PROSCAR) tablet 5 mg  5 mg Oral Daily Ritta Slot, NP   5 mg at 09/18/12 0957  . glucagon (GLUCAGEN) 5 mg in dextrose 5 % 50 mL ivpb  5  mg Intravenous Once Velvet Bathe, MD   5 mg at 09/18/12 1824  . loratadine (CLARITIN) tablet 10 mg  10 mg Oral Daily Ritta Slot, NP   10 mg at 09/18/12 0957  . nitroGLYCERIN (NITROSTAT) SL tablet 0.4 mg  0.4 mg Sublingual Q5 min PRN Ritta Slot, NP      . ondansetron (ZOFRAN) tablet 4 mg  4 mg Oral Q6H PRN Velvet Bathe, MD   4 mg at 09/18/12 1644   Or  . ondansetron (ZOFRAN) injection 4 mg  4 mg Intravenous Q6H PRN Velvet Bathe, MD      . sodium bicarbonate tablet 650 mg  650 mg Oral BID Ritta Slot, NP   650 mg at 09/18/12 0958  . sodium chloride 0.9 % bolus 1,000 mL  1,000 mL Intravenous Once Velvet Bathe, MD   1,000 mL at 09/18/12 1833  . sodium chloride 0.9 % injection 3 mL  3 mL Intravenous Q12H Velvet Bathe, MD      . sodium chloride 0.9 % injection 3 mL  3 mL Intravenous Q12H Ritta Slot, NP      . Tamsulosin HCl (FLOMAX) capsule 0.4 mg  0.4  mg Oral BID Ritta Slot, NP   0.4 mg at 09/18/12 0958  . DISCONTD: 0.9 %  sodium chloride infusion   Intravenous Continuous Velvet Bathe, MD 50 mL/hr at 09/17/12 2258    . DISCONTD: amLODipine (NORVASC) tablet 10 mg  10 mg Oral Daily Ritta Slot, NP   10 mg at 09/18/12 0958  . DISCONTD: carvedilol (COREG) tablet 12.5 mg  12.5 mg Oral BID WC Velvet Bathe, MD   12.5 mg at 09/18/12 0957  . DISCONTD: cloNIDine (CATAPRES) tablet 0.1 mg  0.1 mg Oral BID Ritta Slot, NP   0.1 mg at 09/18/12 0957  . DISCONTD: DOPamine (INTROPIN) 800 mg in dextrose 5 % 250 mL infusion  5 mcg/kg/min Intravenous Titrated Aletta Edouard, MD 6.3 mL/hr at 09/18/12 1920 5 mcg/kg/min at 09/18/12 1920  . DISCONTD: hydrALAZINE (APRESOLINE) injection 4 mg  4 mg Intravenous Q6H PRN Velvet Bathe, MD   4 mg at 09/17/12 1836  . DISCONTD: hydrALAZINE (APRESOLINE) tablet 50 mg  50 mg Oral Q8H Ritta Slot, NP   50 mg at 09/18/12 0639  . DISCONTD: isosorbide mononitrate (IMDUR) 24 hr tablet 30 mg  30 mg Oral Daily Ritta Slot, NP   30 mg at 09/18/12 0957  . DISCONTD: NIFEdipine (PROCARDIA XL/ADALAT-CC) 24 hr tablet 90 mg  90 mg Oral Daily Ritta Slot, NP   90 mg at 09/18/12 0957  . DISCONTD: sodium chloride 0.9 % bolus 500 mL  500 mL Intravenous Once Velvet Bathe, MD   500 mL at 09/18/12 1743  . DISCONTD: terazosin (HYTRIN) capsule 2 mg  2 mg Oral QHS Ritta Slot, NP        ROS: unable to obtain full ROS due to patient somnolence  Physical Exam: Blood pressure 100/46, pulse 88, temperature 98.8 F (37.1 C), temperature source Oral, resp. rate 22, height 5\' 10"  (1.778 m), weight 71 kg (156 lb 8.4 oz), SpO2 93.00%.  GENERAL: no acute distress.  EYES: Extra ocular movements are intact. There is no lid lag. Sclera is anicteric.  ENT: Oropharynx is clear. Dentition is within normal limits.  NECK: Supple. The thyroid is not enlarged.  LYMPH: There are no masses or lymphadenopathy present.  HEART: Regular rate and rhythm with no m/g/r.   Normal  S1/S2. Mild JVD LUNGS: crackles at right base ABDOMEN: Soft, non-tender, and non-distended with normoactive bowel sounds. There is no hepatosplenomegaly.  EXTREMITIES: No clubbing, cyanosis, or edema.  PULSES:  DP/PT pulses were +2 and equal bilaterally.  SKIN: Warm, dry, and intact.  NEUROLOGIC: somnolent but arousable and answers questions, moves all ext, follows instructions   Results: Results for orders placed during the hospital encounter of 09/17/12 (from the past 24 hour(s))  BASIC METABOLIC PANEL     Status: Abnormal   Collection Time   09/18/12  5:45 AM      Component Value Range   Sodium 141  135 - 145 mEq/L   Potassium 4.9  3.5 - 5.1 mEq/L   Chloride 115 (*) 96 - 112 mEq/L   CO2 12 (*) 19 - 32 mEq/L   Glucose, Bld 86  70 - 99 mg/dL   BUN 73 (*) 6 - 23 mg/dL   Creatinine, Ser 6.97 (*) 0.50 - 1.35 mg/dL   Calcium 7.9 (*) 8.4 - 10.5 mg/dL   GFR calc non Af Amer 7 (*) >90 mL/min   GFR calc Af Amer 8 (*) >90 mL/min  CBC WITH DIFFERENTIAL     Status: Abnormal   Collection Time   09/18/12  5:45 AM      Component Value Range   WBC 5.6  4.0 - 10.5 K/uL   RBC 2.39 (*) 4.22 - 5.81 MIL/uL   Hemoglobin 7.1 (*) 13.0 - 17.0 g/dL   HCT 20.4 (*) 39.0 - 52.0 %   MCV 85.4  78.0 - 100.0 fL   MCH 29.7  26.0 - 34.0 pg   MCHC 34.8  30.0 - 36.0 g/dL   RDW 14.7  11.5 - 15.5 %   Platelets 162  150 - 400 K/uL   Neutrophils Relative 56  43 - 77 %   Neutro Abs 3.2  1.7 - 7.7 K/uL   Lymphocytes Relative 22  12 - 46 %   Lymphs Abs 1.3  0.7 - 4.0 K/uL   Monocytes Relative 6  3 - 12 %   Monocytes Absolute 0.4  0.1 - 1.0 K/uL   Eosinophils Relative 13 (*) 0 - 5 %   Eosinophils Absolute 0.7  0.0 - 0.7 K/uL   Basophils Relative 2 (*) 0 - 1 %   Basophils Absolute 0.1  0.0 - 0.1 K/uL  TYPE AND SCREEN     Status: Normal (Preliminary result)   Collection Time   09/18/12  9:50 AM      Component Value Range   ABO/RH(D) B POS     Antibody Screen NEG     Sample Expiration 09/21/2012      Unit Number [id number]     Blood Component Type RED CELLS,LR     Unit division 00     Status of Unit ISSUED     Transfusion Status OK TO TRANSFUSE     Crossmatch Result Compatible     Unit Number [id number]     Blood Component Type RED CELLS,LR     Unit division 00     Status of Unit ALLOCATED     Transfusion Status OK TO TRANSFUSE     Crossmatch Result Compatible    PREPARE RBC (CROSSMATCH)     Status: Normal   Collection Time   09/18/12  9:50 AM      Component Value Range   Order Confirmation ORDER PROCESSED BY BLOOD BANK    ABO/RH     Status: Normal  Collection Time   09/18/12  9:50 AM      Component Value Range   ABO/RH(D) B POS    TRANSFUSION REACTION     Status: Normal (Preliminary result)   Collection Time   09/18/12  4:20 PM      Component Value Range   Post RXN DAT IgG NEG     DAT C3 NEG     Path interp tx rxn PENDING    HEMOGLOBIN AND HEMATOCRIT, BLOOD     Status: Abnormal   Collection Time   09/18/12  4:54 PM      Component Value Range   Hemoglobin 6.8 (*) 13.0 - 17.0 g/dL   HCT 20.6 (*) 39.0 - 52.0 %  HEMOGLOBIN AND HEMATOCRIT, BLOOD     Status: Abnormal   Collection Time   09/18/12  5:30 PM      Component Value Range   Hemoglobin 6.9 (*) 13.0 - 17.0 g/dL   HCT 19.9 (*) 39.0 - 52.0 %    CXR: Clear on 10/18 EKG: NSR with LVH and repolarization abnormality There was concern for EKG changes, accensuation in [place] inversion is due to the ED EKG from 10/18 being obtained on 45mm/mV scale  Assessment/Plan: 66 yo AAM with nonischemic CM and interval resolution of LV systolic dysfunction, CKD stage IV, and poorly controlled HTN admitted with HTN, anemia and worsening renal disease who is now hypotensive after restarting home BP meds 1. Symptomatic Hypotension: I checked limited (subcostal view only) echo given worsening renal dysfunction that showed no effusion.  I suspect he is not taking his meds at home and cannot tolerate them at current dose - stop  further IV fluids - responded well to dopamine at 77mcg/kg/min and is feeling better - hold BP meds for now and restart at lower doses once BP is back up 2. Chest Pain: suspect due to hypotension, EKG changes from LVH - check serial enzymes - continue ASA 3. CKD IV: - given worsening renal dysfunction and anemia, consider consulting nephrology 4. Nonischemic Cardiomyopathy: resolution of systolic dysfunction at last echo.  Prior dysfunction probably related to HTN +/- cocaine - BP control 5. Anemia:  - per [facility]  Trevor [name] 09/18/2012, 8:33 PM

## 2012-09-18 NOTE — Progress Notes (Signed)
CRITICAL VALUE ALERT  Critical value received:  CO2 9 Date of notification:  09/18/2012   Time of notification:  9:42 PM  Critical value read back: yes  Nurse who received alert:  Reatha Armour RN  MD notified (1st page): Dr. Marin Comment   Time of first page: 9:45 PM   MD notified (2nd page): Dr. Donnal Debar  Time of second page: 9:47 PM   Responding MD: Dr. Donnal Debar  Time MD responded:  9:52 PM

## 2012-09-18 NOTE — Progress Notes (Signed)
1st unit blood transfusing. BP 83/49 automatic and maunal. HR 59. O2 sat 95% on 2L. Blood transfusion stopped for possible rxn. New bag of normal saline transfusing. Spoke to Dr. Wendee Beavers; order given to stop transfusion and give 534ml normal saline bolus.

## 2012-09-18 NOTE — Progress Notes (Signed)
Contacted on-call floor physician regarding missing orders for labs, home medications. Physician will address.

## 2012-09-18 NOTE — Progress Notes (Signed)
Report called to [facility], RN for patient transfer to 2600. 2nd 1000L bolus given. BP 82/53. O2 sat 97%. Will transfer to room 2602. Cindee Salt

## 2012-09-18 NOTE — Progress Notes (Signed)
BP 80/49 manual. Dr. Wendee Beavers to see patient; new orders received. 2nd 512ml bolus infusing per order. Cindee Salt

## 2012-09-19 DIAGNOSIS — N289 Disorder of kidney and ureter, unspecified: Secondary | ICD-10-CM

## 2012-09-19 LAB — BASIC METABOLIC PANEL
CO2: 12 mEq/L — ABNORMAL LOW (ref 19–32)
Chloride: 112 mEq/L (ref 96–112)
Creatinine, Ser: 6.87 mg/dL — ABNORMAL HIGH (ref 0.50–1.35)
GFR calc Af Amer: 9 mL/min — ABNORMAL LOW (ref >90)
Potassium: 4.6 mEq/L (ref 3.5–5.1)
Sodium: 139 mEq/L (ref 135–145)

## 2012-09-19 LAB — TROPONIN I
Troponin I: <0.3 ng/mL (ref <0.30)
Troponin I: <0.3 ng/mL (ref <0.30)

## 2012-09-19 LAB — URINALYSIS, ROUTINE W REFLEX MICROSCOPIC
Glucose, UA: NEGATIVE mg/dL
Ketones, ur: NEGATIVE mg/dL
Leukocytes, UA: NEGATIVE
pH: 5 (ref 5.0–8.0)

## 2012-09-19 LAB — URINE MICROSCOPIC-ADD ON

## 2012-09-19 LAB — PROTEIN / CREATININE RATIO, URINE: Protein Creatinine Ratio: 1.86 — ABNORMAL HIGH (ref 0.00–0.15)

## 2012-09-19 MED ORDER — DARBEPOETIN ALFA-POLYSORBATE 200 MCG/0.4ML IJ SOLN
200.0000 ug | INTRAMUSCULAR | Status: DC
  Administered 2012-09-19 – 2012-09-26 (×2): 200 ug via SUBCUTANEOUS
  Filled 2012-09-19 (×3): qty 0.4

## 2012-09-19 MED ORDER — MUPIROCIN 2 % EX OINT
1.0000 "application " | TOPICAL_OINTMENT | Freq: Two times a day (BID) | CUTANEOUS | Status: AC
  Administered 2012-09-19 – 2012-09-23 (×9): 1 via NASAL
  Filled 2012-09-19 (×2): qty 22

## 2012-09-19 MED ORDER — SODIUM CHLORIDE 0.9 % IV SOLN
125.0000 mg | Freq: Every day | INTRAVENOUS | Status: AC
  Administered 2012-09-19 – 2012-09-29 (×10): 125 mg via INTRAVENOUS
  Filled 2012-09-19 (×20): qty 10

## 2012-09-19 MED ORDER — CALCIUM ACETATE 667 MG PO CAPS
667.0000 mg | ORAL_CAPSULE | Freq: Three times a day (TID) | ORAL | Status: DC
  Administered 2012-09-19 – 2012-09-26 (×17): 667 mg via ORAL
  Filled 2012-09-19 (×24): qty 1

## 2012-09-19 MED ORDER — DOPAMINE-DEXTROSE 3.2-5 MG/ML-% IV SOLN
2.5000 ug/kg/min | INTRAVENOUS | Status: DC
  Filled 2012-09-19: qty 250

## 2012-09-19 MED ORDER — DOPAMINE-DEXTROSE 3.2-5 MG/ML-% IV SOLN: 5.0000 ug/kg/min | INTRAVENOUS | Status: DC

## 2012-09-19 MED ORDER — CHLORHEXIDINE GLUCONATE CLOTH 2 % EX PADS
6.0000 | MEDICATED_PAD | Freq: Every day | CUTANEOUS | Status: AC
  Administered 2012-09-19 – 2012-09-23 (×5): 6 via TOPICAL

## 2012-09-19 MED ORDER — SODIUM BICARBONATE 8.4 % IV SOLN
50.0000 meq | Freq: Once | INTRAVENOUS | Status: AC
  Administered 2012-09-19: 50 meq via INTRAVENOUS
  Filled 2012-09-19: qty 50

## 2012-09-19 NOTE — Consult Note (Signed)
[facility] Consultation Note Requesting Physician:  Dr. Wendee Beavers Primary Nephrologist: ?? [facility]; scheduled to be seen at [facility] last week but missed appt d/t hospitalization  Reason for Consult:  Renal failure, metabolic acidosis, hyperkalemia, management of CKD5 related issues  HPI: The patient is a 66 y.o. year-old AAM with past history significant for hypertension with variable control, cardiomyopathy with EF 40 (followed by [facility]; hisory of abnormal stress test bet never cathed), history of stroke in 2012 (short term [name]), right eye blindness d/t stroke,, AAA (last assessed at [place] in 2012 - report in EPIC). Has had known CKD.  Was seen (he says) "a long time ago at [facility] but has mostly been followed at the [facility].  He has no permanent access in place and during a recent (he says last week) hospitalization at the [facility] was told he was "close to needing dialysis but it would be better if he got his access done in [place]".  He thought he was "on the transplant list" but as it turns out has never undergone transplant evaluation.  I am not sure what workup has been done in past but has had renal US in 2012 that showed 9.8, 9.2 cm kidneys, and UA in 2012 with blood and protein.  He is a former cocaine user but says "last November was the last time"  He was admitted on the current occasion due to uncontrolled hypertension ascribed to taking home meds incorrectly.  After being started back on what what thought to be his usual home regimen he developed profound hypotension, required transfer to the unit and is on low dose dopamine. His creatinines since admission have been around 7.  He has received sodium bicarbonate for metabolic acidosis.  States he has had issues with low energy and shortness of breath for months.  Appetite fair.  No nausea or vomiting. Serial creatinine data are as follows and show a steady rate of progression.  Creatinine, Ser    Date/Time Value Range Status  09/19/2012  5:05 AM 6.87* 0.50 - 1.35 mg/dL Final  09/18/2012  8:40 PM 6.79* 0.50 - 1.35 mg/dL Final  09/18/2012  5:45 AM 6.97* 0.50 - 1.35 mg/dL Final  09/17/2012  5:53 PM 6.92* 0.50 - 1.35 mg/dL Final  09/17/2012  3:48 PM 7.14* 0.50 - 1.35 mg/dL Final  09/17/2012  2:08 PM 7.00* 0.50 - 1.35 mg/dL Final  05/08/2012  5:24 AM 4.39* 0.50 - 1.35 mg/dL Final  05/07/2012  6:30 AM 4.59* 0.50 - 1.35 mg/dL Final  05/06/2012 12:01 PM 4.25* 0.50 - 1.35 mg/dL Final  05/06/2012  6:56 AM 4.27* 0.50 - 1.35 mg/dL Final  05/05/2012 11:03 PM 4.37* 0.50 - 1.35 mg/dL Final  05/05/2012  5:54 PM 4.20* 0.50 - 1.35 mg/dL Final  10/10/2011  3:57 AM 2.70* 0.50 - 1.35 mg/dL Final  10/09/2011  5:10 AM 2.84* 0.50 - 1.35 mg/dL Final  10/07/2011  9:38 AM 3.20* 0.50 - 1.35 mg/dL Final  10/07/2011  9:23 AM 3.07* 0.50 - 1.35 mg/dL Final  08/12/2011 12:26 PM 2.4* 0.4 - 1.5 mg/dL Final  08/07/2011 11:09 AM 2.9* 0.4 - 1.5 mg/dL Final   Past Medical History  Diagnosis Date  . HTN (hypertension)   . Anxiety   . Cardiomyopathy   . Renal insufficiency   . Myocardial infarction   . CVA (cerebral infarction)   . Chest pain   . Shortness of breath   . Stroke   . Headache   . Arthritis  Past Surgical History  Procedure Date  . Tee without cardioversion 10/09/2011    Procedure: TRANSESOPHAGEAL ECHOCARDIOGRAM (TEE);  Surgeon: Lelon Perla, MD;  Location[facility] ENDOSCOPY;  Service: Cardiovascular;  Laterality: N/A;  . Right hand    Family History  Problem Relation Age of Onset  . Heart attack Mother     MI in her 84s  . Anesthesia problems Neg Hx   . Hypotension Neg Hx   . Malignant hyperthermia Neg Hx   . Pseudochol deficiency Neg Hx    Social History:  reports that he has been smoking Cigarettes.  He has a 25 pack-year smoking history. He has never used smokeless tobacco. He reports that he drinks alcohol. By report of other writers stated crack cocaine twice a week but he tells me has not used any  since last November.  States he is not actively drinking but does smoke cigarettes.  Right handed.  Attended [employer] and "[employer]  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  Allergies: No Known Allergies  Home medications: Prior to Admission medications   Medication Sig Start Date End Date Taking? Authorizing Provider  acetaminophen (TYLENOL) 325 MG tablet Take 650 mg by mouth every 6 (six) hours as needed. For pain   Yes Historical Provider, MD  amLODipine (NORVASC) 10 MG tablet Take 10 mg by mouth daily. 08/07/11   Lelon Perla, MD  aspirin 81 MG chewable tablet Chew 81 mg by mouth daily.    Historical Provider, MD  cloNIDine (CATAPRES) 0.1 MG tablet Take 0.1 mg by mouth 2 (two) times daily.    Historical Provider, MD  finasteride (PROSCAR) 5 MG tablet Take 5 mg by mouth daily.    Historical Provider, MD  hydrALAZINE (APRESOLINE) 50 MG tablet Take 1 tablet (50 mg total) by mouth 3 (three) times daily. 11/27/11 11/26/12  Lelon Perla, MD  isosorbide mononitrate (IMDUR) 30 MG 24 hr tablet Take 1 tablet (30 mg total) by mouth daily. 10/10/11 10/09/12  Debbe Odea, MD  loratadine (CLARITIN) 10 MG tablet Take 10 mg by mouth daily.    Historical Provider, MD  NIFEdipine (PROCARDIA-XL/ADALAT CC) 60 MG 24 hr tablet Take 90 mg by mouth daily.     Historical Provider, MD  nitroGLYCERIN (NITROSTAT) 0.4 MG SL tablet Place 1 tablet (0.4 mg total) under the tongue every 5 (five) minutes as needed. For chest pain 09/08/12   Imogene Burn, PA  sodium bicarbonate 650 MG tablet Take 1 tablet (650 mg total) by mouth 2 (two) times daily. 05/08/12 05/08/13  Eugenie Filler, MD  Tamsulosin HCl (FLOMAX) 0.4 MG CAPS Take 0.4 mg by mouth 2 (two) times daily.    Historical Provider, MD  terazosin (HYTRIN) 2 MG capsule Take 2 mg by mouth at bedtime.    Historical Provider, MD    Inpatient medications:   . aspirin  81 mg Oral Daily  . Chlorhexidine Gluconate Cloth  6 each Topical Q0600  .  darbepoetin (ARANESP) injection - NON-DIALYSIS  200 mcg Subcutaneous Q Sun-1800  . finasteride  5 mg Oral Daily  . glucagon (GLUCAGEN) 5 MG IV  5 mg Intravenous Once  . loratadine  10 mg Oral Daily  . mupirocin ointment  1 application Nasal BID  . sodium bicarbonate  50 mEq Intravenous Once  . sodium bicarbonate  50 mEq Intravenous Once  . sodium bicarbonate  1,300 mg Oral TID  . sodium chloride  1,000 mL Intravenous Once  . sodium chloride  3 mL Intravenous Q12H  . sodium chloride  3 mL Intravenous Q12H  . Tamsulosin HCl  0.4 mg Oral BID  . DISCONTD: amLODipine  10 mg Oral Daily  . DISCONTD: carvedilol  12.5 mg Oral BID WC  . DISCONTD: cloNIDine  0.1 mg Oral BID  . DISCONTD: hydrALAZINE  50 mg Oral Q8H  . DISCONTD: isosorbide mononitrate  30 mg Oral Daily  . DISCONTD: NIFEdipine  90 mg Oral Daily  . DISCONTD: sodium bicarbonate  650 mg Oral BID  . DISCONTD: sodium chloride  500 mL Intravenous Once  . DISCONTD: terazosin  2 mg Oral QHS   . DOPamine    . DISCONTD: sodium chloride 75 mL/hr at 09/18/12 1827  . DISCONTD: DOPamine 5 mcg/kg/min (09/18/12 1920)  . DISCONTD: DOPamine 10 mcg/kg/min (09/18/12 2000)    Review of Systems Gen:  Denies headache, fever, chills, sweats.  No weight loss. HEENT:  No visual change, sore throat, difficulty swallowing. Resp:  SOB, chest tightness, DOE for "months" Cardiac:  No chest "pain" but chest pressure.  No palpitations GI:   Denies abdominal pain.   No nausea, vomiting, diarrhea.  No constipation. GU:  Denies difficulty or change in voiding; does note nocturia X 2-3.  No tea or coca cola colored urine.  Has to "push hard" to get urine out on occasion.     MS:  Denies joint pain or swelling.   Derm:  Denies skin rash or itching.  No chronic skin conditions.  Neuro:  Some weakness on the right from old stroke.  Blind inright eye from stroke.  Generally weak with any exertion Psych:  Denies symptoms of depression of anxiety.  No hallucination.      Physical Examination: Temp:  [93.5 F (34.2 C)-98.8 F (37.1 C)] 96.8 F (36 C) (10/20 0721) Pulse Rate:  [55-92] 75  (10/20 0900) Cardiac Rhythm:  [-] Normal sinus rhythm (10/20 0800) Resp:  [16-22] 17  (10/20 0900) BP: (83-138)/(46-66) 112/53 mmHg (10/20 0800) SpO2:  [93 %-100 %] 100 % (10/20 0900) Weight:  [71 kg (156 lb 8.4 oz)-72 kg (158 lb 11.7 oz)] 72 kg (158 lb 11.7 oz) (10/20 0600)  BP 112/53  Pulse 75  Temp 96.8 F (36 C) (Oral)  Resp 17  Ht 5\' 10"  (1.778 m)  Wt 72 kg (158 lb 11.7 oz)  BMI 22.78 kg/m2  SpO2 100% Middle aged BM NAD On low dose dopamine C/o being "cold" [place]/AT Right eye blindness No JVD Lungs grossly clear Heart sounds very distant No audible rub Abdomen +BS + abdominal bruit No focal tenderness Extremities without edema No asterixus  Basic Metabolic Panel:  Lab [id number] 0505 09/18/12 2040 09/18/12 0545 09/17/12 1753 09/17/12 1548 09/17/12 1408  NA 139 135 141 140 141 144  K 4.6 5.0 4.9 5.6* 6.1* 5.6*  CL 112 113* 115* 114* 113* 118*  CO2 12* 9* 12* 12* 12* --  GLUCOSE 145* 309* 86 88 85 78  BUN 80* 73* 73* 68* 70* 71*  CREATININE 6.87* 6.79* 6.97* 6.92* 7.14* 7.00*  ALB -- -- -- -- -- --  CALCIUM 7.7* 7.2* 7.9* 8.2* 8.9 --  PHOS -- -- -- 5.8* -- --   Liver Function Tests:  Lab 09/17/12 1753  AST --  ALT --  ALKPHOS --  BILITOT --  PROT --  ALBUMIN 3.3*  CBC:  Lab 09/18/12 2040 09/18/12 1730 09/18/12 1654 09/18/12 0545 09/17/12 1327  WBC 8.7 -- -- 5.6 5.9  NEUTROABS -- -- --  3.2 3.9  HGB 7.6* 6.9* 6.8* 7.1* --  HCT 22.3* 19.9* 20.6* 20.4* --  MCV 83.5 -- -- 85.4 85.4  PLT 169 -- -- 162 198   Lab 09/19/12 0403 09/18/12 2326 09/18/12 2039 09/17/12 1610  CKTOTAL -- -- -- --  CKMB -- -- -- --  CKMBINDEX -- -- -- --  TROPONINI <0.30 <0.30 <0.30 <0.30  Iron Studies:  Lab 09/17/12 1753  IRON 32*  TIBC 264  TRANSFERRIN --  FERRITIN 234  TSAT 12%  Xrays/Other Studies: Dg Chest 2 View  09/17/2012  *RADIOLOGY REPORT*   Clinical Data: Hypertension.  CHEST - 2 VIEW  Comparison: 05/05/2012.  Findings: The heart, mediastinum and hilar contours are normal. Clear and fully expand. There is slight thickening along the fissures in the lateral projection, likely chronic.  There are no acute bony changes. There is no evidence of effusions or pneumothoraces.  IMPRESSION: No active disease.   Original Report Authenticated By: Joaquim Lai, M.D.   *RADIOLOGY REPORT*  (09/2011) Clinical Data: Chronic kidney disease, hypertension, tobacco use  RENAL/URINARY TRACT ULTRASOUND  RENAL DUPLEX ULTRASOUND  Technique: Routine ultrasound examination of the kidneys and  urinary tract was performed. Duplex and color Doppler ultrasound  was utilized to evaluate blood flow in the renal arteries and  kidneys.  Comparison: None available  RENAL/URINARY TRACT ULTRASOUND  Findings:  Right Kidney: 9.8 cm. Several small cysts, largest in the upper  pole 24 x 27 mm. No solid lesion or hydronephrosis.  Left Kidney: 9.2 cm. No hydronephrosis. Well-preserved cortex.  Normal size and parenchymal echotexture without focal  abnormalities.  Bladder: Physiologically distended, unremarkable. Prominent  prostate is noted.  RENAL DUPLEX ULTRASOUND  Right Renal Artery Velocities  Origin: 96 cm/sec  Mid: 92 cm/sec  Hilum: 52 cm/sec  Interlobar: 35 cm/sec  Arcuate: 25 cm/sec  Left Renal Artery Velocities  Origin: 94 cm/sec  Mid: 67 cm/sec  Hilum: 133 cm/sec  Interlobar: 26 cm/sec  Arcuate: 21 cm/sec  There is a suspicion of a second left renal artery, only seen  proximally, with velocities 73 - 86 cm per second.  Aortic Velocity: 102 cm/sec. Incidental note made of a saccular  infrarenal segment which measures 2.5 cm maximum transverse  diameter.  Right Renal Aortic Ratios  Origin: 0.94  Mid: 0.9  Hilum: 0.51  Interlobar: 0.34  Arcuate: 0.25  Left Renal Aortic Ratios  Origin: 0.92  Mid: 0.66  Hilum: 1.3  Interlobar: 0.25   Arcuate:0.21  Findings: There is patency of bilateral renal veins. Layering  sludge noted in the gallbladder.  IMPRESSION:  1. No sonographic evidence of high-grade renal artery stenosis. If  there is continued clinical concern, renal MRA (lower radiation  risk, can be performed noncontrast in the setting of renal  dysfunction) and CTA ( higher spatial resolution) represent more  accurate studies, which are additionally more sensitive to the  detection of duplicated renal arteries.  2. Right renal cysts.  3. Layering sludge in the gallbladder.  4. Enlarged prostate.  ASSESSMENT/RECOMMENDATIONS  65 year old AAM with longstanding hypertension, ASCVD with h/o stroke, cardiomyopathy with EF 40 (never cathed) and documented progressive CKD now at ESRD, with metabolic acidosis, severe anemia; developed hypotension with administration of usual BP meds.  Is at point where initiation of dialysis is probably appropriate.  1. ESRD 2. HTN (with profound hypotension after administration of prescribed meds) - c/w poor outpt compliance) 3. Anemia with iron deficiency 4. Metabolic acidosis 5. H/O CVA 6. H/O AAA (  last eval by [facility]) 7 Hyperphosphatemia; PTH status not known 8. Cardiomyopathy with EF 40% and prev abnormal stress test ()  Recommendations: 1. Once dopamine off and BP starts rising, re-add meds one at a time 2. Renal diet (as you have done) 3. Vein mapping (ordered) 4. Consult [facility] on Monday for access (I would favor both catheter and graft); save left arm 5. Start IV iron repletion and darbepoetin (once starts HD can change to replete with his treatments) (ordered) 6. Check PTH (ordered) 7. Start phosphate binders (ordered) 8. Check 2D echo (ordered) 9. Dialysis education (ordered) 10. Has h/o AAA - last evaluated in 2012 - consider abd [place] to reassess (original eval done at [facility]) 11. Additional bicarb supplementation (on po; give a second amp IV today)  Thanks for  the consult.  Will follow.  Jamal Maes,  MD [facility] [phone] pager 09/19/2012, 9:43 AM

## 2012-09-19 NOTE — Progress Notes (Signed)
[facility] PROGRESS NOTE  Trevor [name] [id number] DOB: 1946-03-20 DOA: 09/17/2012 PCP: William Hamburger, MD  Assessment/Plan: 1. Hypotension - At this point medication vs transfusion hypotensive reaction.  Will plan on weaning patient off of the dopamine drip today pending blood pressures.  - Once blood pressures get elevated will start some of his blood pressure medication at lower doses and more judiciously given his recent hypotensive episode.  2. Anemia - Pt is s/p one unit PRBC with recent hypotensive episode while transfusion was being administered. - Last hgb 7.6 - Most likely related to CKD - Continue to monitor closely with daily cbc's no active bleeding currently  3. ESRD - Discussed case with nephrologist 09/18/12 and acidosis has been addressed.  Patient received 1 am of sodium bicarb and his oral regimen was increased. - Consider consulting nephrology pending hospital course - Potassium level 4.6  4. Tobacco abuse - recommend tobacco cessation - Continue nicotine patch  5. Chest pain  - Troponins negative x 3 - Cardiology on board - No chest pain reported this am 09/19/12  Code Status: Full Family Communication: Spoke with patient no family at bedside Disposition Plan: Pending improvement of blood pressure.  Will also set up home health or resume for help with administration of medications once patient transitions home.   Consultants:  Cardiology: Velora Heckler  Procedures:  None  Antibiotics:  None  HPI/Subjective: Patient mentions that he feels better this morning. Denies any chest pain currently.  No acute issues reported overnight.  Objective: Filed Vitals:   09/19/12 0700 09/19/12 0721 09/19/12 0800 09/19/12 0900  BP:  118/58 112/53   Pulse: 92 85 87 75  Temp:  96.8 F (36 C)    TempSrc:  Oral    Resp: 19 18 21 17   Height:      Weight:      SpO2: 100% 100% 100% 100%    Intake/Output Summary (Last 24 hours) at 09/19/12  0946 Last data filed at 09/19/12 0900  Gross per 24 hour  Intake 2199.2 ml  Output      0 ml  Net 2199.2 ml   Filed Weights   09/17/12 1915 09/18/12 1800 09/19/12 0600  Weight: 67.2 kg (148 lb 2.4 oz) 71 kg (156 lb 8.4 oz) 72 kg (158 lb 11.7 oz)    Exam:   General:  Pt in NAD, Alert and Awake  Cardiovascular: RRR, No MRG  Respiratory: CTA BL, no wheezes  Abdomen: Soft, NT, ND  Data Reviewed: Basic Metabolic Panel:  Lab [id number] 0505 09/18/12 2040 09/18/12 0545 09/17/12 1753 09/17/12 1548  NA 139 135 141 140 141  K 4.6 5.0 4.9 5.6* 6.1*  CL 112 113* 115* 114* 113*  CO2 12* 9* 12* 12* 12*  GLUCOSE 145* 309* 86 88 85  BUN 80* 73* 73* 68* 70*  CREATININE 6.87* 6.79* 6.97* 6.92* 7.14*  CALCIUM 7.7* 7.2* 7.9* 8.2* 8.9  MG -- -- -- -- --  PHOS -- -- -- 5.8* --   Liver Function Tests:  Lab 09/17/12 1753  AST --  ALT --  ALKPHOS --  BILITOT --  PROT --  ALBUMIN 3.3*   No results found for this basename: LIPASE:5,AMYLASE:5 in the last 168 hours No results found for this basename: AMMONIA:5 in the last 168 hours CBC:  Lab 09/18/12 2040 09/18/12 1730 09/18/12 1654 09/18/12 0545 09/17/12 1408 09/17/12 1327  WBC 8.7 -- -- 5.6 -- 5.9  NEUTROABS -- -- -- 3.2 -- 3.9  HGB 7.6*  6.9* 6.8* 7.1* 7.8* --  HCT 22.3* 19.9* 20.6* 20.4* 23.0* --  MCV 83.5 -- -- 85.4 -- 85.4  PLT 169 -- -- 162 -- 198   Cardiac Enzymes:  Lab 09/19/12 0403 09/18/12 2326 09/18/12 2039 09/17/12 1610  CKTOTAL -- -- -- --  CKMB -- -- -- --  CKMBINDEX -- -- -- --  TROPONINI <0.30 <0.30 <0.30 <0.30   BNP (last 3 results)  Basename 05/05/12 2302  PROBNP 32672.0*   CBG: No results found for this basename: GLUCAP:5 in the last 168 hours  Recent Results (from the past 240 hour(s))  MRSA PCR SCREENING     Status: Abnormal   Collection Time   09/18/12  6:14 PM      Component Value Range Status Comment   MRSA by PCR POSITIVE (*) NEGATIVE Final      Studies: Dg Chest 2 View  09/17/2012   *RADIOLOGY REPORT*  Clinical Data: Hypertension.  CHEST - 2 VIEW  Comparison: 05/05/2012.  Findings: The heart, mediastinum and hilar contours are normal. Clear and fully expand. There is slight thickening along the fissures in the lateral projection, likely chronic.  There are no acute bony changes. There is no evidence of effusions or pneumothoraces.  IMPRESSION: No active disease.   Original Report Authenticated By: Joaquim Lai, M.D.     Scheduled Meds:   . aspirin  81 mg Oral Daily  . Chlorhexidine Gluconate Cloth  6 each Topical Q0600  . darbepoetin (ARANESP) injection - NON-DIALYSIS  200 mcg Subcutaneous Q Sun-1800  . finasteride  5 mg Oral Daily  . glucagon (GLUCAGEN) 5 MG IV  5 mg Intravenous Once  . loratadine  10 mg Oral Daily  . mupirocin ointment  1 application Nasal BID  . sodium bicarbonate  50 mEq Intravenous Once  . sodium bicarbonate  50 mEq Intravenous Once  . sodium bicarbonate  1,300 mg Oral TID  . sodium chloride  1,000 mL Intravenous Once  . sodium chloride  3 mL Intravenous Q12H  . sodium chloride  3 mL Intravenous Q12H  . Tamsulosin HCl  0.4 mg Oral BID  . DISCONTD: amLODipine  10 mg Oral Daily  . DISCONTD: carvedilol  12.5 mg Oral BID WC  . DISCONTD: cloNIDine  0.1 mg Oral BID  . DISCONTD: hydrALAZINE  50 mg Oral Q8H  . DISCONTD: isosorbide mononitrate  30 mg Oral Daily  . DISCONTD: NIFEdipine  90 mg Oral Daily  . DISCONTD: sodium bicarbonate  650 mg Oral BID  . DISCONTD: sodium chloride  500 mL Intravenous Once  . DISCONTD: terazosin  2 mg Oral QHS   Continuous Infusions:   . DOPamine    . DISCONTD: sodium chloride 75 mL/hr at 09/18/12 1827  . DISCONTD: DOPamine 5 mcg/kg/min (09/18/12 1920)  . DISCONTD: DOPamine 10 mcg/kg/min (09/18/12 2000)    Active Problems:  Hypotension transfusion mediated  Renal insufficiency  Systolic CHF, chronic  ESRD (end stage renal disease)    Time spent: > 35 minutes    Velvet Bathe  [facility] Pager [phone] If 8PM-8AM, please contact night-coverage at [email or web address], password [facility] 09/19/2012, 9:46 AM  LOS: 2 days

## 2012-09-19 NOTE — Progress Notes (Signed)
SUBJECTIVE:  Feels better.    OBJECTIVE:   Vitals:   Filed Vitals:   09/19/12 0700 09/19/12 0721 09/19/12 0800 09/19/12 0900  BP:  118/58 112/53   Pulse: 92 85 87 75  Temp:  96.8 F (36 C)    TempSrc:  Oral    Resp: 19 18 21 17   Height:      Weight:      SpO2: 100% 100% 100% 100%   I&O's:   Intake/Output Summary (Last 24 hours) at 09/19/12 1133 Last data filed at 09/19/12 0900  Gross per 24 hour  Intake 2199.2 ml  Output      0 ml  Net 2199.2 ml   TELEMETRY: Reviewed telemetry pt in NSR:     PHYSICAL EXAM General: Well developed, well nourished, in no acute distress Head:    Normal cephalic and atramatic  Lungs:  No wheezing Heart:  HRRR S1 S2 Abdomen:  abdomen soft and non-tender Msk:  . Normal strength and tone for age. Extremities:  No  edema.   Neuro: Alert and oriented X 3. Psych:  Normal affect, responds appropriately   LABS: Basic Metabolic Panel:  Basename 09/19/12 0505 09/18/12 2040 09/17/12 1753  NA 139 135 --  K 4.6 5.0 --  CL 112 113* --  CO2 12* 9* --  GLUCOSE 145* 309* --  BUN 80* 73* --  CREATININE 6.87* 6.79* --  CALCIUM 7.7* 7.2* --  MG -- -- --  PHOS -- -- 5.8*   Liver Function Tests:  Basename 09/17/12 1753  AST --  ALT --  ALKPHOS --  BILITOT --  PROT --  ALBUMIN 3.3*   No results found for this basename: LIPASE:2,AMYLASE:2 in the last 72 hours CBC:  Basename 09/18/12 2040 09/18/12 1730 09/18/12 0545 09/17/12 1327  WBC 8.7 -- 5.6 --  NEUTROABS -- -- 3.2 3.9  HGB 7.6* 6.9* -- --  HCT 22.3* 19.9* -- --  MCV 83.5 -- 85.4 --  PLT 169 -- 162 --   Cardiac Enzymes:  Basename 09/19/12 0948 09/19/12 0403 09/18/12 2326  CKTOTAL -- -- --  CKMB -- -- --  CKMBINDEX -- -- --  TROPONINI <0.30 <0.30 <0.30   BNP: No components found with this basename: POCBNP:3 D-Dimer: No results found for this basename: DDIMER:2 in the last 72 hours Hemoglobin A1C: No results found for this basename: HGBA1C in the last 72 hours Fasting  Lipid Panel: No results found for this basename: CHOL,HDL,LDLCALC,TRIG,CHOLHDL,LDLDIRECT in the last 72 hours Thyroid Function Tests: No results found for this basename: TSH,T4TOTAL,FREET3,T3FREE,THYROIDAB in the last 72 hours Anemia Panel:  Basename 09/17/12 1753  VITAMINB12 500  FOLATE 8.3  FERRITIN 234  TIBC 264  IRON 32*  RETICCTPCT 2.5   Coag Panel:   Lab Results  Component Value Date   INR 1.22 09/17/2012   INR 0.97 10/07/2011   INR 1.1* 08/07/2011    RADIOLOGY: Dg Chest 2 View  09/17/2012  *RADIOLOGY REPORT*  Clinical Data: Hypertension.  CHEST - 2 VIEW  Comparison: 05/05/2012.  Findings: The heart, mediastinum and hilar contours are normal. Clear and fully expand. There is slight thickening along the fissures in the lateral projection, likely chronic.  There are no acute bony changes. There is no evidence of effusions or pneumothoraces.  IMPRESSION: No active disease.   Original Report Authenticated By: Joaquim Lai, M.D.       ASSESSMENT: Hypotension, renal failure, cardiomyopathy  PLAN:  Wean dopamine as BP allows.  Add antiHTN meds  as needed.  Dialysis access planned.  Nephrology following.  LVEF 40% in the past.  Echo pending.  Does not appear to be in heart failure at this time.    Jettie Booze., MD  09/19/2012  11:33 AM

## 2012-09-19 NOTE — Progress Notes (Signed)
MRSA swab positive, orders placed for treatment per protocol, patient placed on contact precautions

## 2012-09-20 ENCOUNTER — Inpatient Hospital Stay (HOSPITAL_COMMUNITY): Payer: Medicare HMO

## 2012-09-20 DIAGNOSIS — N186 End stage renal disease: Secondary | ICD-10-CM

## 2012-09-20 DIAGNOSIS — I714 Abdominal aortic aneurysm, without rupture: Secondary | ICD-10-CM

## 2012-09-20 DIAGNOSIS — I517 Cardiomegaly: Secondary | ICD-10-CM

## 2012-09-20 DIAGNOSIS — Z9889 Other specified postprocedural states: Secondary | ICD-10-CM

## 2012-09-20 DIAGNOSIS — I959 Hypotension, unspecified: Secondary | ICD-10-CM

## 2012-09-20 DIAGNOSIS — N179 Acute kidney failure, unspecified: Secondary | ICD-10-CM

## 2012-09-20 LAB — PARATHYROID HORMONE, INTACT (NO CA): PTH: 329.5 pg/mL — ABNORMAL HIGH (ref 14.0–72.0)

## 2012-09-20 LAB — RENAL FUNCTION PANEL
Albumin: 2.8 g/dL — ABNORMAL LOW (ref 3.5–5.2)
BUN: 78 mg/dL — ABNORMAL HIGH (ref 6–23)
CO2: 15 mEq/L — ABNORMAL LOW (ref 19–32)
Chloride: 114 mEq/L — ABNORMAL HIGH (ref 96–112)
Creatinine, Ser: 7.45 mg/dL — ABNORMAL HIGH (ref 0.50–1.35)
Glucose, Bld: 87 mg/dL (ref 70–99)

## 2012-09-20 LAB — HEPATITIS C ANTIBODY (REFLEX): HCV Ab: REACTIVE — AB

## 2012-09-20 MED ORDER — ZOLPIDEM TARTRATE 5 MG PO TABS
5.0000 mg | ORAL_TABLET | Freq: Once | ORAL | Status: AC
  Administered 2012-09-20: 5 mg via ORAL
  Filled 2012-09-20: qty 1

## 2012-09-20 MED ORDER — HYDRALAZINE HCL 20 MG/ML IJ SOLN
10.0000 mg | Freq: Once | INTRAMUSCULAR | Status: AC
  Administered 2012-09-20: 10 mg via INTRAVENOUS
  Filled 2012-09-20: qty 0.5

## 2012-09-20 MED ORDER — FUROSEMIDE 10 MG/ML IJ SOLN
40.0000 mg | Freq: Once | INTRAMUSCULAR | Status: AC
  Administered 2012-09-20: 40 mg via INTRAVENOUS
  Filled 2012-09-20: qty 4

## 2012-09-20 MED ORDER — CARVEDILOL 12.5 MG PO TABS
12.5000 mg | ORAL_TABLET | Freq: Two times a day (BID) | ORAL | Status: DC
  Administered 2012-09-20 – 2012-09-26 (×12): 12.5 mg via ORAL
  Filled 2012-09-20 (×16): qty 1

## 2012-09-20 NOTE — Progress Notes (Signed)
BP remains elevated after first dose of Coreg given.  Dr. Wendee Beavers notified.  No new orders at this time.

## 2012-09-20 NOTE — Progress Notes (Addendum)
Pt called RN and states, "I don't feel right, i am having trouble breathing".  Oxygen Saturation 100% with rate in 20s.  Pt noted to have increased work to breath.  BP 184/97, HR 90s.  Dr. Clementeen Graham notified.  Awaiting call back.  Report given to Maudie Mercury RN night shift.  Dr. Clementeen Graham to come see pt.

## 2012-09-20 NOTE — Progress Notes (Signed)
BP elevated.  Dr.Vega aware,  Coreg ordered and given.  Pt NPO for abd [place].

## 2012-09-20 NOTE — Progress Notes (Signed)
Right  Upper Extremity Vein Map    Cephalic  Segment Diameter Depth Comment  1. Axilla 1.52mm mm   2. Mid upper arm mm mm   3. Above AC 1.70mm mm   4. In AC mm mm Not visualized  5. Below AC mm mm Not visualized  6. Mid forearm mm mm Not visualized  7. Wrist mm mm Not visualized   mm mm    mm mm    mm mm    Basilic  Segment Diameter Depth Comment  1. Axilla 5.82mm 7.63mm   2. Mid upper arm mm mm   3. Above [facility] 5.20mm 4.37mm   4. In AC 2.25mm 2.48mm   5. Below AC 2.1mm 1.52mm   6. Mid forearm 1.80mm 0.37mm   7. Wrist 1.9mm 1.72mm    mm mm    mm mm    mm mm     Left Upper Extremity Vein Map    Cephalic-Not visualized.   Basilic  Segment Diameter Depth Comment  1. Axilla 5.20mm 7.25mm   2. Mid upper arm 6.38mm 41mm   3. Above [facility] 5.41mm 3.50mm   4. In [facility] 3.75mm 1.31mm   5. Below AC 2.22mm 1.72mm   6. Mid forearm 55mm 1.47mm   7. Wrist 1.24mm 60mm    mm mm    mm mm    mm mm      09/20/2012 4:34 PM Maudry Mayhew, RDMS, RDCS

## 2012-09-20 NOTE — Progress Notes (Signed)
  Echocardiogram 2D Echocardiogram has been performed.  Diamond Nickel 09/20/2012, 10:46 AM

## 2012-09-20 NOTE — Consult Note (Signed)
[facility] [place] CONSULT NOTE 09/20/2012 DOB: 09/07/2046 MRN : [id number]  CC: ESRD Referring Physician: Geoffry Paradise, MD  History of Present Illness: Trevor [name] is a 66 y.o. male With history of end-stage renal disease (followed by nephrologist in [place]), cardiomyopathy ejection fraction 40% with myoview in September 2012 revealing apical thinning and global hypokinesis EF 40%. Presented to the ER because his blood pressure was elevated at home. He has had history of non compliance with recommended blood pressure regimen. Creatinine was 7.0 and hemoglobin was 7.8 with negative  Pt will Need HD soon per Dr. Cherlyn Cushing note and he asked [place] to evaluate for [facility] and perm access. Pt denies numbness/tingling or pain in either hand. He is right handed.  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 HTN (hypertension)   . Anxiety   . Cardiomyopathy   . Renal insufficiency   . Myocardial infarction   . CVA (cerebral infarction)   . Chest pain   . Shortness of breath   . Stroke   . Headache   . Arthritis     Past Surgical History  Procedure Date  . Tee without cardioversion 10/09/2011    Procedure: TRANSESOPHAGEAL ECHOCARDIOGRAM (TEE);  Surgeon: Lelon Perla, MD;  Location: [facility] ENDOSCOPY;  Service: Cardiovascular;  Laterality: N/A;  . Right hand      ROS: [x]  Positive  [ ]  Denies    General: [ ]  Weight loss, [ ]  Fever, [ ]  chills Neurologic: [ ]  Dizziness, [ ]  Blackouts, [ ]  Seizure [ ]  Stroke, [ ]  "Mini stroke", [ ]  Slurred speech, [ ]  Temporary blindness; [ ]  weakness in arms or legs, [ ]  Hoarseness Cardiac: [ ]  Chest pain/pressure, [ ]  Shortness of breath at rest [x ] Shortness of breath with exertion, [ ]  Atrial fibrillation or irregular heartbeat Vascular: [ ]  Pain in legs with walking, [ ]  Pain in legs at rest, [ ]  Pain in legs at night,  [ ]  Non-healing ulcer, [ ]  Blood clot in vein/DVT,   Pulmonary:  [ ]  Home oxygen, [ ]  Productive cough, [ ]  Coughing up blood, [ ]  Asthma,  [ ]  Wheezing Musculoskeletal:  [ ]  Arthritis, [ ]  Low back pain, [ ]  Joint pain Hematologic: [ ]  Easy Bruising, [ ]  Anemia; [ ]  Hepatitis Gastrointestinal: [ ]  Blood in stool, [ ]  Gastroesophageal Reflux/heartburn, [ ]  Trouble swallowing Urinary: [x ] chronic Kidney disease, [ ]  on HD - [ ]  MWF or [ ]  TTHS, [ ]  Burning with urination, [ ]  Difficulty urinating Skin: [ ]  Rashes, [ ]  Wounds Psychological: [ ]  Anxiety, [ ]  Depression  Social History History  Substance Use Topics  . Smoking status: Current Every Day Smoker -- 0.5 packs/day for 50 years    Types: Cigarettes  . Smokeless tobacco: Never Used  . Alcohol Use: Yes     Occasional    Family History Family History  Problem Relation Age of Onset  . Heart attack Mother     MI in her 51s  . Anesthesia problems Neg Hx   . Hypotension Neg Hx   . Malignant hyperthermia Neg Hx   . Pseudochol deficiency Neg Hx     No Known Allergies  Current Facility-Administered Medications  Medication Dose Route Frequency Provider Last Rate Last Dose  . acetaminophen (TYLENOL) tablet 650 mg  650 mg Oral Q6H PRN Velvet Bathe,  MD       Or  . acetaminophen (TYLENOL) suppository 650 mg  650 mg Rectal Q6H PRN Velvet Bathe, MD      . aspirin chewable tablet 81 mg  81 mg Oral Daily Ritta Slot, NP   81 mg at 09/20/12 0928  . calcium acetate (PHOSLO) capsule 667 mg  667 mg Oral TID WC Lucrezia Starch, MD   667 mg at 09/20/12 0631  . Chlorhexidine Gluconate Cloth 2 % PADS 6 each  6 each Topical Q0600 Velvet Bathe, MD   6 each at 09/20/12 [phone]  . darbepoetin (ARANESP) injection 200 mcg  200 mcg Subcutaneous Q Sun-1800 Lucrezia Starch, MD   200 mcg at 09/19/12 1729  . DOPamine (INTROPIN) 800 mg in dextrose 5 % 250 mL infusion  2.5 mcg/kg/min Intravenous Titrated Thompson Grayer, MD 3.4 mL/hr at 09/19/12 1526 2.5 mcg/kg/min at 09/19/12 1526  . ferric gluconate (NULECIT) 125 mg in  sodium chloride 0.9 % 100 mL IVPB  125 mg Intravenous Daily Lucrezia Starch, MD   125 mg at 09/19/12 1234  . finasteride (PROSCAR) tablet 5 mg  5 mg Oral Daily Ritta Slot, NP   5 mg at 09/20/12 0928  . loratadine (CLARITIN) tablet 10 mg  10 mg Oral Daily Ritta Slot, NP   10 mg at 09/20/12 0928  . mupirocin ointment (BACTROBAN) 2 % 1 application  1 application Nasal BID Velvet Bathe, MD   1 application at [phone]  . nitroGLYCERIN (NITROSTAT) SL tablet 0.4 mg  0.4 mg Sublingual Q5 min PRN Ritta Slot, NP      . ondansetron (ZOFRAN) tablet 4 mg  4 mg Oral Q6H PRN Velvet Bathe, MD   4 mg at 09/18/12 1644   Or  . ondansetron (ZOFRAN) injection 4 mg  4 mg Intravenous Q6H PRN Velvet Bathe, MD      . sodium bicarbonate injection 50 mEq  50 mEq Intravenous Once Lucrezia Starch, MD   50 mEq at 09/19/12 1238  . sodium bicarbonate tablet 1,300 mg  1,300 mg Oral TID Ritta Slot, NP   1,300 mg at 09/20/12 0928  . sodium chloride 0.9 % injection 3 mL  3 mL Intravenous Q12H Velvet Bathe, MD   3 mL at 09/20/12 0928  . sodium chloride 0.9 % injection 3 mL  3 mL Intravenous Q12H Ritta Slot, NP   3 mL at 09/20/12 0929  . Tamsulosin HCl (FLOMAX) capsule 0.4 mg  0.4 mg Oral BID Ritta Slot, NP   0.4 mg at 09/20/12 0928  . DISCONTD: DOPamine (INTROPIN) 800 mg in dextrose 5 % 250 mL infusion  5 mcg/kg/min Intravenous Titrated Thompson Grayer, MD 6.8 mL/hr at 09/19/12 0800 5 mcg/kg/min at 09/19/12 0800     Imaging: USG abdomen  Significant Diagnostic Studies: CBC Lab Results  Component Value Date   WBC 8.7 09/18/2012   HGB 7.6* 09/18/2012   HCT 22.3* 09/18/2012   MCV 83.5 09/18/2012   PLT 169 09/18/2012    BMET    Component Value Date/Time   NA 140 09/20/2012 0451   K 4.9 09/20/2012 0451   CL 114* 09/20/2012 0451   CO2 15* 09/20/2012 0451   GLUCOSE 87 09/20/2012 0451   BUN 78* 09/20/2012 0451   CREATININE 7.45* 09/20/2012 0451   CALCIUM 7.9* 09/20/2012 0451   GFRNONAA 7* 09/20/2012 0451     GFRAA 8* 09/20/2012 0451    COAG Lab Results  Component Value Date   INR 1.22 09/17/2012   INR 0.97 10/07/2011   INR 1.1* 08/07/2011   No results found for this basename: PTT     Physical Examination BP Readings from Last 3 Encounters:  09/20/12 171/80  09/08/12 195/96  05/08/12 153/61   Temp Readings from Last 3 Encounters:  09/20/12 96.8 F (36 C) Rectal  05/08/12 98.6 F (37 C) Oral  10/10/11 98.3 F (36.8 C) Oral   SpO2 Readings from Last 3 Encounters:  09/20/12 96%  05/08/12 97%  10/10/11 99%   Pulse Readings from Last 3 Encounters:  09/20/12 94  09/08/12 98  05/07/12 87    General:  WDWN in NAD HENT: WNL Eyes: Pupils equal Pulmonary: normal non-labored breathing , without Rales, rhonchi,  wheezing Cardiac: RRR, without  Murmurs, rubs or gallops; No carotid bruits Abdomen: soft, NT, no masses Skin: no rashes, ulcers noted Vascular Exam/Pulses: 2+ radial and ulnar pulses left Both hands warm with good grip, normal sensation Extremities without ischemic changes, no Gangrene , no cellulitis; no open wounds;  Musculoskeletal: no muscle wasting or atrophy  Neurologic: A&O X 3; Appropriate Affect ;  Speech is fluent/normal  Non-Invasive Vascular Imaging: VM pending USG abd pending  ASSESSMENT/PLAN: Trevor [name] is a 66 y.o. male Now with ESRD in need of TDC and perm HD access Vein Mapping pending HX small, asymptomatic AAA <3.0 CM 1 yr ago - new usg pend I have examined the patient, reviewed and agree with above. I have reviewed the vein map. The patient has a small cephalic veins bilaterally with moderate basilic vein. I discussed the need for temporary and permanent access for hemodialysis. The patient is scheduled tomorrow for Dr. Scot Dock for a hemodialysis catheter and a left arm access either AV fistula or AV graft. The patient understands determination be made depending on vein size at surgery.  Benicia Bergevin, MD 09/20/2012 6:12 PM

## 2012-09-20 NOTE — Progress Notes (Signed)
PROGRESS NOTE  Subjective:   [name] is a 66 yo with hx of nonischemic CM, CKD IV, HTN admitted with elevated BP, anemia and worsening renal fxn.  He developed hypotension after restarting his meds.  The suspecion was that he was not actually taking his medications at home.    Echo has been ordered for today.  Objective:    Vital Signs:   Temp:  [96 F (35.6 C)-98.9 F (37.2 C)] 96.8 F (36 C) (10/21 0723) Pulse Rate:  [74-94] 91  (10/21 0723) Resp:  [16-21] 19  (10/21 0723) BP: (112-160)/(53-99) 160/67 mmHg (10/21 0723) SpO2:  [94 %-100 %] 95 % (10/21 0723) Weight:  [158 lb 11.7 oz (72 kg)] 158 lb 11.7 oz (72 kg) (10/21 0336)  Last BM Date: 09/17/12   24-hour weight change: Weight change: 2 lb 3.3 oz (1 kg)  Weight trends: Filed Weights   09/18/12 1800 09/19/12 0600 09/20/12 0336  Weight: 156 lb 8.4 oz (71 kg) 158 lb 11.7 oz (72 kg) 158 lb 11.7 oz (72 kg)    Intake/Output:  10/20 0701 - 10/21 0700 In: 1295.2 [P.O.:1080; I.V.:115.2; IV Piggyback:100] Out: -      Physical Exam: BP 160/67  Pulse 91  Temp 96.8 F (36 C) (Rectal)  Resp 19  Ht 5\' 10"  (1.778 m)  Wt 158 lb 11.7 oz (72 kg)  BMI 22.78 kg/m2  SpO2 95%  General: Vital signs reviewed and noted. Chronically ill appearing  Head: Normocephalic, atraumatic.  Eyes: conjunctivae/corneas clear.  EOM's intact.   Throat: normal  Neck: Supple. Normal carotids. No JVD  Lungs:  Clear   Heart: Regular rate,  With normal  S1 S2. No murmurs,   Abdomen:  Soft, non-tender, non-distended with normoactive bowel sounds. No hepatomegaly. No rebound/guarding. No abdominal masses.  Extremities: Distal pedal pulses are 2+ .  No edema.    Neurologic: A&O X3, CN II - XII are grossly intact. Motor strength is 5/5 in the all 4 extremities.  Psych: Responds to questions appropriately with normal affect.    Labs: BMET:  Basename 09/20/12 0451 09/19/12 0505 09/17/12 1753  NA 140 139 --  K 4.9 4.6 --  CL 114* 112 --    CO2 15* 12* --  GLUCOSE 87 145* --  BUN 78* 80* --  CREATININE 7.45* 6.87* --  CALCIUM 7.9* 7.7* --  MG -- -- --  PHOS 5.5* -- 5.8*    Liver function tests:  Basename 09/20/12 0451 09/17/12 1753  AST -- --  ALT -- --  ALKPHOS -- --  BILITOT -- --  PROT -- --  ALBUMIN 2.8* 3.3*   No results found for this basename: LIPASE:2,AMYLASE:2 in the last 72 hours  CBC:  Basename 09/18/12 2040 09/18/12 1730 09/18/12 0545 09/17/12 1327  WBC 8.7 -- 5.6 --  NEUTROABS -- -- 3.2 3.9  HGB 7.6* 6.9* -- --  HCT 22.3* 19.9* -- --  MCV 83.5 -- 85.4 --  PLT 169 -- 162 --    Cardiac Enzymes:  Basename 09/19/12 0948 09/19/12 0403 09/18/12 2326 09/18/12 2039  CKTOTAL -- -- -- --  CKMB -- -- -- --  TROPONINI <0.30 <0.30 <0.30 <0.30    Coagulation Studies:  Basename 09/17/12 1327  LABPROT 15.2  INR 1.22    Basename 09/17/12 1753  VITAMINB12 500  FOLATE 8.3  FERRITIN 234  TIBC 264  IRON 32*  RETICCTPCT 2.5     Tele: 09/20/12- NSR  at 91  Medications:  Infusions:    . DOPamine 2.5 mcg/kg/min (09/19/12 1526)  . DISCONTD: sodium chloride 75 mL/hr at 09/18/12 1827  . DISCONTD: DOPamine 10 mcg/kg/min (09/18/12 2000)  . DISCONTD: DOPamine 5 mcg/kg/min (09/19/12 0800)    Scheduled Medications:    . aspirin  81 mg Oral Daily  . calcium acetate  667 mg Oral TID WC  . Chlorhexidine Gluconate Cloth  6 each Topical Q0600  . darbepoetin (ARANESP) injection - NON-DIALYSIS  200 mcg Subcutaneous Q Sun-1800  . ferric gluconate (FERRLECIT/NULECIT) IV  125 mg Intravenous Daily  . finasteride  5 mg Oral Daily  . loratadine  10 mg Oral Daily  . mupirocin ointment  1 application Nasal BID  . sodium bicarbonate  50 mEq Intravenous Once  . sodium bicarbonate  1,300 mg Oral TID  . sodium chloride  3 mL Intravenous Q12H  . sodium chloride  3 mL Intravenous Q12H  . Tamsulosin HCl  0.4 mg Oral BID    Assessment/ Plan:    Hypotension :  He admits to not taking his medications so  the home dose as listed was not what he was taking.  When the team restarted what they thought was a dose that he had been tolerating, it was actually too much.   He is still on low dose dopamine and his BP is elevated.  If the Dopamine is for hypotension, this certainly could be stopped.    Systolic CHF, chronic (123XX123) Echo for today.  He has been noncompliant with his medications.   Will restart meds soon.     ESRD (end stage renal disease) (05/05/2012)  plan per int. Med.   Disposition: echo today. Length of Stay: 3  Thayer Headings, Brooke Bonito., MD, [facility] 09/20/2012, 7:37 AM Office [phone] Pager [phone]

## 2012-09-20 NOTE — Progress Notes (Signed)
[facility] PROGRESS NOTE  Trevor [name] [id number] DOB: 24-Jan-1946 DOA: 09/17/2012 PCP: William Hamburger, MD  Assessment/Plan: 1. Hypotension - At this point medication vs transfusion hypotensive reaction.  Most likely related to medications though and as such agree with continuing patient's antihypertensive medication slowly and one at a time. - Pressors discontinued 10/21 in the AM due to improved blood pressure. - Will start B blocker given elevated blood pressure and history of systolic dysfunction.  2. Anemia - Pt is s/p one unit PRBC with recent hypotensive episode while transfusion was being administered. - Last hgb 7.6 - Most likely related to CKD - Continue to monitor closely with daily cbc's no active bleeding currently  3. ESRD - Nephrology on board and currently helping make further medical decisions. - Pt set up for vein mapping for future HD sessions - PTH ordered and phosphate binders ordered. - Currently on Bicarb for Acidosis likely related to ESRD. - Can't thank Nephrology enough for help with this case and management of this patient.  4. Tobacco abuse - recommend tobacco cessation - Continue nicotine patch  5. Chest pain  - Troponins negative x 3 - Cardiology on board - No chest pain reported this am  Code Status: Full Family Communication: Spoke with patient and daughter. Disposition Plan: Pending improvement of blood pressure as well as continued recommendations from nephrology.   Consultants:  Cardiology: Velora Heckler  Procedures:  None  Antibiotics:  None  HPI/Subjective: Patient mentions that he feels better this morning. Denies any chest pain currently.  No acute issues reported overnight.  Patient was able to be weaned off of the dopamine this am.  Objective: Filed Vitals:   09/20/12 1224 09/20/12 1300 09/20/12 1536 09/20/12 1600  BP: 183/96 171/95 175/92   Pulse: 94 82 80   Temp:  98 F (36.7 C)  98.2 F (36.8 C)    TempSrc:  Axillary  Axillary  Resp: 16 15 18    Height:      Weight:      SpO2: 99% 100% 95%     Intake/Output Summary (Last 24 hours) at 09/20/12 1715 Last data filed at 09/20/12 1500  Gross per 24 hour  Intake  757.6 ml  Output   1150 ml  Net -392.4 ml   Filed Weights   09/18/12 1800 09/19/12 0600 09/20/12 0336  Weight: 71 kg (156 lb 8.4 oz) 72 kg (158 lb 11.7 oz) 72 kg (158 lb 11.7 oz)    Exam:   General:  Pt in NAD, Alert and Awake  Cardiovascular: RRR, No MRG  Respiratory: CTA BL, no wheezes  Abdomen: Soft, NT, ND  Data Reviewed: Basic Metabolic Panel:  Lab 0000000 0451 09/19/12 0505 09/18/12 2040 09/18/12 0545 09/17/12 1753  NA 140 139 135 141 140  K 4.9 4.6 5.0 4.9 5.6*  CL 114* 112 113* 115* 114*  CO2 15* 12* 9* 12* 12*  GLUCOSE 87 145* 309* 86 88  BUN 78* 80* 73* 73* 68*  CREATININE 7.45* 6.87* 6.79* 6.97* 6.92*  CALCIUM 7.9* 7.7* 7.2* 7.9* 8.2*  MG -- -- -- -- --  PHOS 5.5* -- -- -- 5.8*   Liver Function Tests:  Lab 09/20/12 0451 09/17/12 1753  AST -- --  ALT -- --  ALKPHOS -- --  BILITOT -- --  PROT -- --  ALBUMIN 2.8* 3.3*   No results found for this basename: LIPASE:5,AMYLASE:5 in the last 168 hours No results found for this basename: AMMONIA:5 in the last 168 hours CBC:  Lab 09/18/12 2040 09/18/12 1730 09/18/12 1654 09/18/12 0545 09/17/12 1408 09/17/12 1327  WBC 8.7 -- -- 5.6 -- 5.9  NEUTROABS -- -- -- 3.2 -- 3.9  HGB 7.6* 6.9* 6.8* 7.1* 7.8* --  HCT 22.3* 19.9* 20.6* 20.4* 23.0* --  MCV 83.5 -- -- 85.4 -- 85.4  PLT 169 -- -- 162 -- 198   Cardiac Enzymes:  Lab 09/19/12 0948 09/19/12 0403 09/18/12 2326 09/18/12 2039 09/17/12 1610  CKTOTAL -- -- -- -- --  CKMB -- -- -- -- --  CKMBINDEX -- -- -- -- --  TROPONINI <0.30 <0.30 <0.30 <0.30 <0.30   BNP (last 3 results)  Basename 05/05/12 2302  PROBNP 32672.0*   CBG: No results found for this basename: GLUCAP:5 in the last 168 hours  Recent Results (from the past 240 hour(s))   MRSA PCR SCREENING     Status: Abnormal   Collection Time   09/18/12  6:14 PM      Component Value Range Status Comment   MRSA by PCR POSITIVE (*) NEGATIVE Final      Studies: [place] Retroperitoneal Comp  09/20/2012  *RADIOLOGY REPORT*  Clinical Data: Renal failure.  Evaluate for enlarging AAA.  RENAL/URINARY TRACT ULTRASOUND COMPLETE  Comparison:  09/04/2011.  Findings:  Right Kidney:  Measures 9.7 cm.  Increased parenchymal echogenicity.  Anechoic lesions with increased through transmission measure up to 2.8 x 2.5 x 1.8 cm, consistent with cysts. Parenchymal echogenicity is increased.  No hydronephrosis.  Left Kidney:  Measures 9.5 cm.  Parenchymal echogenicity is increased.  No hydronephrosis.  No focal lesions.  Additional finding:  Aorta measures up to 2.7 x 2.5 cm.  Right common iliac artery measures 1.5 cm and left common iliac artery, 1.3 cm.  There are bilateral pleural effusions.  A 2.8 cm shadowing gallstone is noted as well.  IMPRESSION:  1.  Increased renal parenchymal echogenicity is consistent with chronic medical renal disease. 2.  No abdominal aortic aneurysm. 3.  Bilateral pleural effusions. 4.  Cholelithiasis.   Original Report Authenticated By: Luretha Rued, M.D.     Scheduled Meds:    . aspirin  81 mg Oral Daily  . calcium acetate  667 mg Oral TID WC  . carvedilol  12.5 mg Oral BID WC  . Chlorhexidine Gluconate Cloth  6 each Topical Q0600  . darbepoetin (ARANESP) injection - NON-DIALYSIS  200 mcg Subcutaneous Q Sun-1800  . ferric gluconate (FERRLECIT/NULECIT) IV  125 mg Intravenous Daily  . finasteride  5 mg Oral Daily  . loratadine  10 mg Oral Daily  . mupirocin ointment  1 application Nasal BID  . sodium bicarbonate  1,300 mg Oral TID  . sodium chloride  3 mL Intravenous Q12H  . sodium chloride  3 mL Intravenous Q12H  . Tamsulosin HCl  0.4 mg Oral BID   Continuous Infusions:    . DISCONTD: DOPamine Stopped (09/20/12 0900)    Active Problems:  Hypotension  transfusion mediated  Renal insufficiency  Systolic CHF, chronic  ESRD (end stage renal disease)    Time spent: > 35 minutes    Velvet Bathe  [facility] Pager [phone] If 8PM-8AM, please contact night-coverage at [email or web address], password [facility] 09/20/2012, 5:15 PM  LOS: 3 days

## 2012-09-20 NOTE — Progress Notes (Signed)
[name]  Subjective:  Awake, alert, says he feels "much better" than on admission.  Knows he'll need to start dialysis soon. On dopamine 2.5 mcg/kg/min   Objective: Vital signs in last 24 hours: Blood pressure 164/76, pulse 87, temperature 96.8 F (36 C), temperature source Rectal, resp. rate 18, height 5\' 10"  (1.778 m), weight 72 kg (158 lb 11.7 oz), SpO2 99.00%.    PHYSICAL EXAM General--as above Chest--clear Heart--no rub Abd--nontender Extr--no edema, IV in L forearm (need L forearm for vascular access).    Lab Results:   Lab 09/20/12 0451 09/19/12 0505 09/18/12 2040 09/17/12 1753  NA 140 139 135 --  K 4.9 4.6 5.0 --  CL 114* 112 113* --  CO2 15* 12* 9* --  BUN 78* 80* 73* --  CREATININE 7.45* 6.87* 6.79* --  ALB -- -- -- --  GLUCOSE 87 -- -- --  CALCIUM 7.9* 7.7* 7.2* --  PHOS 5.5* -- -- 5.8*     Basename 09/18/12 2040 09/18/12 1730 09/18/12 0545  WBC 8.7 -- 5.6  HGB 7.6* 6.9* --  HCT 22.3* 19.9* --  PLT 169 -- 162   I have reviewed the patient's current medications.   Assessment/Plan: 1. ESRD  2. HTN (with profound hypotension after administration of prescribed meds) - c/w poor outpt compliance)  3. Anemia with iron deficiency  4. Metabolic acidosis  5. H/O CVA  6. H/O AAA (last eval by [place])  7. Cardiomyopathy with EF 40% and prev abnormal stress test ([facility])   Recommendations:  1.  Renal diet (done).  Check PTH (ordered).  Start phosphate binders (ordered) . Vein mapping (ordered)  Save left arm        ( I  called [facility] office this AM and asked for tunneled HD cath and permanent vascular access)  and asked that IV be taken out of L                   forearm).   Dialysis education (ordered). 2.  Once dopamine is off and if BP starts rising, re-add BP meds one at a time (if BP gets above "goal range" of 120/70-130/80)  3.  On Aranesp and IV Fe 4.  PO bicarb supplementation (on 1300 mg BID and bicarb is  increasing) 5.  No suggestions 6.  Abd [place] ordered to look at kidneys and AAA 7. Check 2D echo (ordered)          LOS: 3 days   [name] F 09/20/2012,9:10 AM   .[email or web address]

## 2012-09-21 ENCOUNTER — Inpatient Hospital Stay (HOSPITAL_COMMUNITY): Payer: Medicare HMO

## 2012-09-21 ENCOUNTER — Inpatient Hospital Stay (HOSPITAL_COMMUNITY): Payer: Medicare HMO | Admitting: Anesthesiology

## 2012-09-21 ENCOUNTER — Encounter (HOSPITAL_COMMUNITY)
Admission: EM | Disposition: A | Discharge status: Skilled Nursing Facility | Payer: Self-pay | Source: Home / Self Care | Attending: Internal Medicine

## 2012-09-21 ENCOUNTER — Encounter (HOSPITAL_COMMUNITY): Payer: Self-pay | Admitting: Anesthesiology

## 2012-09-21 DIAGNOSIS — I10 Essential (primary) hypertension: Secondary | ICD-10-CM

## 2012-09-21 DIAGNOSIS — I959 Hypotension, unspecified: Secondary | ICD-10-CM

## 2012-09-21 DIAGNOSIS — I5022 Chronic systolic (congestive) heart failure: Secondary | ICD-10-CM

## 2012-09-21 HISTORY — PX: INSERTION OF DIALYSIS CATHETER: SHX1324

## 2012-09-21 LAB — CBC
HCT: 25.9 % — ABNORMAL LOW (ref 39.0–52.0)
Hemoglobin: 8.8 g/dL — ABNORMAL LOW (ref 13.0–17.0)
RBC: 3.13 MIL/uL — ABNORMAL LOW (ref 4.22–5.81)
WBC: 7.4 10*3/uL (ref 4.0–10.5)

## 2012-09-21 LAB — RENAL FUNCTION PANEL
BUN: 79 mg/dL — ABNORMAL HIGH (ref 6–23)
CO2: 15 mEq/L — ABNORMAL LOW (ref 19–32)
Chloride: 113 mEq/L — ABNORMAL HIGH (ref 96–112)
GFR calc Af Amer: 8 mL/min — ABNORMAL LOW (ref >90)
Glucose, Bld: 83 mg/dL (ref 70–99)
Potassium: 5 mEq/L (ref 3.5–5.1)

## 2012-09-21 SURGERY — INSERTION OF DIALYSIS CATHETER
Anesthesia: Monitor Anesthesia Care | Site: Neck | Wound class: Clean

## 2012-09-21 MED ORDER — 0.9 % SODIUM CHLORIDE (POUR BTL) OPTIME
TOPICAL | Status: DC | PRN
  Administered 2012-09-21: 1000 mL

## 2012-09-21 MED ORDER — HEPARIN SODIUM (PORCINE) 1000 UNIT/ML IJ SOLN
INTRAMUSCULAR | Status: DC | PRN
  Administered 2012-09-21: 6000 [IU] via INTRAVENOUS

## 2012-09-21 MED ORDER — THROMBIN 20000 UNITS EX SOLR
CUTANEOUS | Status: AC
  Filled 2012-09-21: qty 20000

## 2012-09-21 MED ORDER — LIDOCAINE-EPINEPHRINE (PF) 1 %-1:200000 IJ SOLN
INTRAMUSCULAR | Status: AC
  Filled 2012-09-21: qty 10

## 2012-09-21 MED ORDER — HEPARIN SODIUM (PORCINE) 1000 UNIT/ML IJ SOLN
INTRAMUSCULAR | Status: DC | PRN
  Administered 2012-09-21: 1000 [IU]

## 2012-09-21 MED ORDER — SODIUM CHLORIDE 0.9 % IV SOLN
Freq: Once | INTRAVENOUS | Status: AC
  Administered 2012-09-21: 35 mL/h via INTRAVENOUS

## 2012-09-21 MED ORDER — PARICALCITOL 5 MCG/ML IV SOLN
2.0000 ug | INTRAVENOUS | Status: DC
  Administered 2012-09-24 – 2012-10-01 (×4): 2 ug via INTRAVENOUS
  Filled 2012-09-21 (×5): qty 0.4

## 2012-09-21 MED ORDER — LIDOCAINE-PRILOCAINE 2.5-2.5 % EX CREA
1.0000 "application " | TOPICAL_CREAM | CUTANEOUS | Status: DC | PRN
  Filled 2012-09-21: qty 5

## 2012-09-21 MED ORDER — HEPARIN SODIUM (PORCINE) 1000 UNIT/ML IJ SOLN
INTRAMUSCULAR | Status: AC
  Filled 2012-09-21: qty 1

## 2012-09-21 MED ORDER — CEFAZOLIN SODIUM-DEXTROSE 2-3 GM-% IV SOLR
INTRAVENOUS | Status: DC | PRN
  Administered 2012-09-21: 2 g via INTRAVENOUS

## 2012-09-21 MED ORDER — SODIUM CHLORIDE 0.9 % IV SOLN: 100.0000 mL | INTRAVENOUS | Status: DC | PRN

## 2012-09-21 MED ORDER — HEPARIN SODIUM (PORCINE) 1000 UNIT/ML DIALYSIS
20.0000 [IU]/kg | INTRAMUSCULAR | Status: DC | PRN
  Administered 2012-09-21: 1400 [IU] via INTRAVENOUS_CENTRAL
  Filled 2012-09-21: qty 2

## 2012-09-21 MED ORDER — PROTAMINE SULFATE 10 MG/ML IV SOLN
INTRAVENOUS | Status: DC | PRN
  Administered 2012-09-21 (×3): 10 mg via INTRAVENOUS

## 2012-09-21 MED ORDER — LIDOCAINE HCL (PF) 1 % IJ SOLN
INTRAMUSCULAR | Status: AC
  Filled 2012-09-21: qty 30

## 2012-09-21 MED ORDER — MIDAZOLAM HCL 5 MG/5ML IJ SOLN
INTRAMUSCULAR | Status: DC | PRN
  Administered 2012-09-21 (×2): 1 mg via INTRAVENOUS

## 2012-09-21 MED ORDER — AMLODIPINE BESYLATE 5 MG PO TABS
5.0000 mg | ORAL_TABLET | Freq: Every day | ORAL | Status: DC
  Administered 2012-09-21 – 2012-09-26 (×5): 5 mg via ORAL
  Filled 2012-09-21 (×7): qty 1

## 2012-09-21 MED ORDER — LIDOCAINE HCL (PF) 1 % IJ SOLN: 5.0000 mL | INTRAMUSCULAR | Status: DC | PRN

## 2012-09-21 MED ORDER — PENTAFLUOROPROP-TETRAFLUOROETH EX AERO: 1.0000 "application " | INHALATION_SPRAY | CUTANEOUS | Status: DC | PRN

## 2012-09-21 MED ORDER — FENTANYL CITRATE 0.05 MG/ML IJ SOLN
INTRAMUSCULAR | Status: DC | PRN
  Administered 2012-09-21 (×2): 50 ug via INTRAVENOUS

## 2012-09-21 MED ORDER — CARVEDILOL 12.5 MG PO TABS: 12.5000 mg | ORAL_TABLET | Freq: Two times a day (BID) | ORAL | Status: DC

## 2012-09-21 MED ORDER — PROPOFOL INFUSION 10 MG/ML OPTIME
INTRAVENOUS | Status: DC | PRN
  Administered 2012-09-21: 75 ug/kg/min via INTRAVENOUS

## 2012-09-21 MED ORDER — HEPARIN SODIUM (PORCINE) 1000 UNIT/ML DIALYSIS
1000.0000 [IU] | INTRAMUSCULAR | Status: DC | PRN
  Filled 2012-09-21: qty 1

## 2012-09-21 MED ORDER — NEPRO/CARBSTEADY PO LIQD: 237.0000 mL | ORAL | Status: DC | PRN

## 2012-09-21 MED ORDER — LIDOCAINE-EPINEPHRINE (PF) 1 %-1:200000 IJ SOLN
INTRAMUSCULAR | Status: DC | PRN
  Administered 2012-09-21 (×2): 30 mL via INTRADERMAL

## 2012-09-21 MED ORDER — ALTEPLASE 2 MG IJ SOLR
2.0000 mg | Freq: Once | INTRAMUSCULAR | Status: DC | PRN
  Filled 2012-09-21: qty 2

## 2012-09-21 MED ORDER — SODIUM CHLORIDE 0.9 % IV SOLN
INTRAVENOUS | Status: DC | PRN
  Administered 2012-09-21: 13:00:00 via INTRAVENOUS

## 2012-09-21 MED ORDER — SODIUM CHLORIDE 0.9 % IR SOLN
Status: DC | PRN
  Administered 2012-09-21: 14:00:00

## 2012-09-21 SURGICAL SUPPLY — 69 items
BAG DECANTER FOR FLEXI CONT (MISCELLANEOUS) ×4 IMPLANT
CANISTER SUCTION 2500CC (MISCELLANEOUS) ×4 IMPLANT
CATH CANNON HEMO 15F 50CM (CATHETERS) IMPLANT
CATH CANNON HEMO 15FR 19 (HEMODIALYSIS SUPPLIES) IMPLANT
CATH CANNON HEMO 15FR 23CM (HEMODIALYSIS SUPPLIES) ×4 IMPLANT
CATH CANNON HEMO 15FR 31CM (HEMODIALYSIS SUPPLIES) IMPLANT
CATH CANNON HEMO 15FR 32CM (HEMODIALYSIS SUPPLIES) IMPLANT
CHLORAPREP W/TINT 26ML (MISCELLANEOUS) ×4 IMPLANT
CLIP TI MEDIUM 6 (CLIP) ×4 IMPLANT
CLIP TI WIDE RED SMALL 24 (CLIP) ×4 IMPLANT
CLIP TI WIDE RED SMALL 6 (CLIP) ×4 IMPLANT
CLOTH BEACON ORANGE TIMEOUT ST (SAFETY) ×4 IMPLANT
COVER PROBE W GEL 5X96 (DRAPES) ×4 IMPLANT
COVER SURGICAL LIGHT HANDLE (MISCELLANEOUS) ×4 IMPLANT
DECANTER SPIKE VIAL GLASS SM (MISCELLANEOUS) IMPLANT
DERMABOND ADVANCED (GAUZE/BANDAGES/DRESSINGS) ×1
DERMABOND ADVANCED .7 DNX12 (GAUZE/BANDAGES/DRESSINGS) ×3 IMPLANT
DRAIN PENROSE 1/2X12 LTX STRL (WOUND CARE) IMPLANT
DRAPE C-ARM 42X72 X-RAY (DRAPES) ×4 IMPLANT
DRAPE CHEST BREAST 15X10 FENES (DRAPES) ×4 IMPLANT
ELECT REM PT RETURN 9FT ADLT (ELECTROSURGICAL) ×4
ELECTRODE REM PT RTRN 9FT ADLT (ELECTROSURGICAL) ×3 IMPLANT
GAUZE SPONGE 2X2 8PLY STRL LF (GAUZE/BANDAGES/DRESSINGS) ×3 IMPLANT
GAUZE SPONGE 4X4 16PLY XRAY LF (GAUZE/BANDAGES/DRESSINGS) ×4 IMPLANT
GLOVE BIO SURGEON STRL SZ 6.5 (GLOVE) ×4 IMPLANT
GLOVE BIO SURGEON STRL SZ7.5 (GLOVE) ×8 IMPLANT
GLOVE BIOGEL PI IND STRL 7.0 (GLOVE) ×3 IMPLANT
GLOVE BIOGEL PI IND STRL 7.5 (GLOVE) ×9 IMPLANT
GLOVE BIOGEL PI IND STRL 8 (GLOVE) ×6 IMPLANT
GLOVE BIOGEL PI INDICATOR 7.0 (GLOVE) ×1
GLOVE BIOGEL PI INDICATOR 7.5 (GLOVE) ×3
GLOVE BIOGEL PI INDICATOR 8 (GLOVE) ×2
GLOVE SS BIOGEL STRL SZ 7 (GLOVE) ×3 IMPLANT
GLOVE SUPERSENSE BIOGEL SZ 7 (GLOVE) ×1
GLOVE SURG SS PI 7.0 STRL IVOR (GLOVE) ×4 IMPLANT
GLOVE SURG SS PI 7.5 STRL IVOR (GLOVE) ×12 IMPLANT
GOWN PREVENTION PLUS XLARGE (GOWN DISPOSABLE) ×8 IMPLANT
GOWN STRL NON-REIN LRG LVL3 (GOWN DISPOSABLE) ×12 IMPLANT
GOWN STRL REIN XL XLG (GOWN DISPOSABLE) ×4 IMPLANT
KIT BASIN OR (CUSTOM PROCEDURE TRAY) ×4 IMPLANT
KIT ROOM TURNOVER OR (KITS) ×4 IMPLANT
NEEDLE 18GX1X1/2 (RX/OR ONLY) (NEEDLE) ×4 IMPLANT
NEEDLE 22X1 1/2 (OR ONLY) (NEEDLE) ×4 IMPLANT
NEEDLE HYPO 25GX1X1/2 BEV (NEEDLE) ×4 IMPLANT
NS IRRIG 1000ML POUR BTL (IV SOLUTION) ×4 IMPLANT
PACK CV ACCESS (CUSTOM PROCEDURE TRAY) ×4 IMPLANT
PACK SURGICAL SETUP 50X90 (CUSTOM PROCEDURE TRAY) ×4 IMPLANT
PAD ARMBOARD 7.5X6 YLW CONV (MISCELLANEOUS) ×8 IMPLANT
SPONGE GAUZE 2X2 STER 10/PKG (GAUZE/BANDAGES/DRESSINGS) ×1
SPONGE GAUZE 4X4 12PLY (GAUZE/BANDAGES/DRESSINGS) ×4 IMPLANT
SPONGE SURGIFOAM ABS GEL 100 (HEMOSTASIS) IMPLANT
SUT ETHILON 3 0 PS 1 (SUTURE) ×4 IMPLANT
SUT PROLENE 6 0 BV (SUTURE) ×8 IMPLANT
SUT SILK 2 0 SH (SUTURE) ×4 IMPLANT
SUT SILK 3 0 (SUTURE) ×1
SUT SILK 3-0 18XBRD TIE 12 (SUTURE) ×3 IMPLANT
SUT VIC AB 3-0 SH 27 (SUTURE) ×2
SUT VIC AB 3-0 SH 27X BRD (SUTURE) ×6 IMPLANT
SUT VICRYL 4-0 PS2 18IN ABS (SUTURE) ×12 IMPLANT
SYR 20CC LL (SYRINGE) ×8 IMPLANT
SYR 30ML LL (SYRINGE) IMPLANT
SYR 5ML LL (SYRINGE) ×8 IMPLANT
SYR CONTROL 10ML LL (SYRINGE) ×4 IMPLANT
SYRINGE 10CC LL (SYRINGE) ×4 IMPLANT
TAPE CLOTH SURG 4X10 WHT LF (GAUZE/BANDAGES/DRESSINGS) ×4 IMPLANT
TOWEL OR 17X24 6PK STRL BLUE (TOWEL DISPOSABLE) ×4 IMPLANT
TOWEL OR 17X26 10 PK STRL BLUE (TOWEL DISPOSABLE) ×4 IMPLANT
UNDERPAD 30X30 INCONTINENT (UNDERPADS AND DIAPERS) ×4 IMPLANT
WATER STERILE IRR 1000ML POUR (IV SOLUTION) ×4 IMPLANT

## 2012-09-21 NOTE — Anesthesia Preprocedure Evaluation (Signed)
Anesthesia Evaluation  Patient identified by MRN, date of birth, ID band  Reviewed: Allergy & Precautions, H&P , NPO status , Patient's Chart, lab work & pertinent test results  Airway Mallampati: I TM Distance: >3 FB Neck ROM: full    Dental   Pulmonary          Cardiovascular hypertension, + Past MI and +CHF + dysrhythmias Atrial Fibrillation Rhythm:irregular Rate:Normal     Neuro/Psych  Headaches, PSYCHIATRIC DISORDERS Anxiety CVA, Residual Symptoms    GI/Hepatic   Endo/Other    Renal/GU ESRF, Dialysis and CRFRenal disease     Musculoskeletal   Abdominal   Peds  Hematology   Anesthesia Other Findings   Reproductive/Obstetrics                           Anesthesia Physical Anesthesia Plan  ASA: III  Anesthesia Plan: MAC   Post-op Pain Management:    Induction: Intravenous  Airway Management Planned: Mask  Additional Equipment:   Intra-op Plan:   Post-operative Plan:   Informed Consent: I have reviewed the patients History and Physical, chart, labs and discussed the procedure including the risks, benefits and alternatives for the proposed anesthesia with the patient or authorized representative who has indicated his/her understanding and acceptance.     Plan Discussed with: CRNA, Anesthesiologist and Surgeon  Anesthesia Plan Comments:         Anesthesia Quick Evaluation

## 2012-09-21 NOTE — Progress Notes (Signed)
Pt just returned from OR. Dialysis catherter [facility]. Left arm fistula glue [facility].

## 2012-09-21 NOTE — Anesthesia Postprocedure Evaluation (Signed)
  Anesthesia Post-op Note  Patient: Trevor [name]  Procedure(s) Performed: Procedure(s) (LRB) with comments: INSERTION OF DIALYSIS CATHETER (N/A) - Right Internal Jugular Placement BASCILIC VEIN TRANSPOSITION (Left)  Patient Location: PACU  Anesthesia Type: MAC  Level of Consciousness: awake, oriented, sedated and patient cooperative  Airway and Oxygen Therapy: Patient Spontanous Breathing  Post-op Pain: mild  Post-op Assessment: Post-op Vital signs reviewed, Patient's Cardiovascular Status Stable, Respiratory Function Stable, Patent Airway, No signs of Nausea or vomiting and Pain level controlled  Post-op Vital Signs: stable  Complications: No apparent anesthesia complications

## 2012-09-21 NOTE — Transfer of Care (Signed)
Immediate Anesthesia Transfer of Care Note  Patient: Trevor [name]  Procedure(s) Performed: Procedure(s) (LRB) with comments: INSERTION OF DIALYSIS CATHETER (N/A) - Right Internal Jugular Placement BASCILIC VEIN TRANSPOSITION (Left)  Patient Location: PACU  Anesthesia Type: MAC  Level of Consciousness: awake and alert   Airway & Oxygen Therapy: Patient Spontanous Breathing  Post-op Assessment: Report given to PACU RN and Post -op Vital signs reviewed and stable  Post vital signs: Reviewed  Complications: No apparent anesthesia complications

## 2012-09-21 NOTE — Op Note (Signed)
NAME: Trevor [name]   MRN: [id number] DOB: 1946-08-28    DATE OF OPERATION: 09/21/2012  PREOP DIAGNOSIS: Chronic kidney disease  POSTOP DIAGNOSIS: The same  PROCEDURE:  1. Ultrasound guided placement of right IJ Diatek catheter 2. Left basilic vein transposition  SURGEON: Judeth Cornfield. Scot Dock, MD, FACS  ASSIST: Gerri Lins PA  ANESTHESIA: local with sedation   EBL: minimal  INDICATIONS: Trevor [name] is a 66 y.o. male who is to begin dialysis. We were asked to place a catheter and a fistula.  FINDINGS: the basilic vein was approximately 3.5 mm in diameter. It was felt to be acceptable for a basilic vein transposition.  TECHNIQUE:  The patient was brought to the operating room and sedated by anesthesia. The neck and upper chest were prepped and draped in the usual sterile fashion. Under ultrasound guidance, and after the skin was anesthetized, the right IJ was cannulated and a guidewire introduced into the superior vena cava under fluoroscopic control. The tract over the wire was dilated and then the dilator and peel-away sheath advanced over the wire and the wire and dilator removed. A 23 cm catheter was passed through the peel-away sheath and positioned in the right atrium. The peel-away sheath was removed. The exit site the catheter was selected and the skin anesthetized between the 2 areas. The catheter was then brought through the tunnel cut to the appropriate length and the distal ports were attached. Both ports withdrew easily with and flushed with heparinized saline and filled with concentrated heparin. The catheter was secured at its exit site with a 2-0 nylon suture the the IJ cannulation site was closed with a 4-0 subcuticular stitch. Sterile dressing was applied.  Next attention was turned to the left arm. The left upper extremity was prepped and draped in the usual sterile fashion. 3 incisions were made along the medial aspect of the left upper arm and the basilic vein  was harvested from the antecubital level to the axilla. Branches were divided between clips and 3-0 silk ties. Through the distal incision the brachial artery was dissected free. A tunnel was then created from the incision just above the antecubital level to the axillary incision. The vein was ligated distally and irrigated with heparinized saline. It was marked with a twisting. It was then brought to the previously created tunnel. The patient was then heparinized. The brachial artery was clamped proximally and distally and a longitudinal arteriotomy was made. The vein was spatulated and sewn end-to-side to the artery using continuous 6-0 Prolene suture the patient was a good thrill in fistula and a radial and ulnar signal with the Doppler. Hemostasis was obtained in the wounds and the heparin was partially reversed with protamine. The wounds were closed with deep layer 3-0 Vicryl and the skin closed with 4-0 Vicryl. Dermabond was applied. The patient tolerated the procedure well and was transferred to the recovery room in stable condition. All needle and sponge counts were correct.  [name], MD, FACS [facility]  DATE OF DICTATION:   09/21/2012

## 2012-09-21 NOTE — Progress Notes (Signed)
PROGRESS NOTE  Primary Cardiologist:  Kirk Ruths, MD  Subjective:   Trevor [name] is a 66 yo with hx of nonischemic CM, CKD IV, HTN admitted with elevated BP, anemia and worsening renal fxn.  He developed hypotension after restarting his meds.  The suspecion was that he was not actually taking his medications at home.    Echo shows LV EF of [id number], diastolic dysfunction,   Objective:    Vital Signs:   Temp:  [98 F (36.7 C)-98.7 F (37.1 C)] 98.6 F (37 C) (10/22 0722) Pulse Rate:  [80-98] 82  (10/22 0722) Resp:  [15-28] 19  (10/22 0722) BP: (153-197)/(76-109) 173/85 mmHg (10/22 0722) SpO2:  [94 %-100 %] 99 % (10/22 0500)  Last BM Date: 09/17/12   24-hour weight change: Weight change:   Weight trends: Filed Weights   09/18/12 1800 09/19/12 0600 09/20/12 0336  Weight: 156 lb 8.4 oz (71 kg) 158 lb 11.7 oz (72 kg) 158 lb 11.7 oz (72 kg)    Intake/Output:  10/21 0701 - 10/22 0700 In: 573.4 [P.O.:460; I.V.:3.4; IV Piggyback:110] Out: 2700 [Urine:2700]     Physical Exam: BP 173/85  Pulse 82  Temp 98.6 F (37 C) (Oral)  Resp 19  Ht 5\' 10"  (1.778 m)  Wt 158 lb 11.7 oz (72 kg)  BMI 22.78 kg/m2  SpO2 99%  General: Vital signs reviewed and noted. Chronically ill appearing  Head: Normocephalic, atraumatic.  Eyes: conjunctivae/corneas clear.  EOM's intact.   Throat: normal  Neck: Supple. Normal carotids. No JVD  Lungs:  Clear   Heart: Regular rate,  With normal  S1 S2. No murmurs,   Abdomen:  Soft, non-tender, non-distended with normoactive bowel sounds. No hepatomegaly. No rebound/guarding. No abdominal masses.  Extremities: Distal pedal pulses are 2+ .  No edema.    Neurologic: A&O X3, CN II - XII are grossly intact. Motor strength is 5/5 in the all 4 extremities.  Psych: Responds to questions appropriately with normal affect.    Labs: BMET:  Basename 09/21/12 0430 09/20/12 0451  NA 144 140  K 5.0 4.9  CL 113* 114*  CO2 15* 15*  GLUCOSE 83 87  BUN 79*  78*  CREATININE 7.27* 7.45*  CALCIUM 8.7 7.9*  MG -- --  PHOS 5.2* 5.5*    Liver function tests:  Basename 09/21/12 0430 09/20/12 0451  AST -- --  ALT -- --  ALKPHOS -- --  BILITOT -- --  PROT -- --  ALBUMIN 3.1* 2.8*   No results found for this basename: LIPASE:2,AMYLASE:2 in the last 72 hours  CBC:  Basename 09/21/12 0430 09/18/12 2040  WBC 7.4 8.7  NEUTROABS -- --  HGB 8.8* 7.6*  HCT 25.9* 22.3*  MCV 82.7 83.5  PLT 178 169    Cardiac Enzymes:  Basename 09/19/12 0948 09/19/12 0403 09/18/12 2326 09/18/12 2039  CKTOTAL -- -- -- --  CKMB -- -- -- --  TROPONINI <0.30 <0.30 <0.30 <0.30    Coagulation Studies: No results found for this basename: LABPROT:5,INR:5 in the last 72 hours  Basename 09/20/12 0451  VITAMINB12 --  FOLATE --  FERRITIN 395*  TIBC --  IRON --  RETICCTPCT --     Tele: 09/20/12- NSR  at 91  Medications:    Infusions:    . DISCONTD: DOPamine Stopped (09/20/12 0900)    Scheduled Medications:    . aspirin  81 mg Oral Daily  . calcium acetate  667 mg Oral TID WC  . carvedilol  12.5 mg Oral BID WC  . Chlorhexidine Gluconate Cloth  6 each Topical Q0600  . darbepoetin (ARANESP) injection - NON-DIALYSIS  200 mcg Subcutaneous Q Sun-1800  . ferric gluconate (FERRLECIT/NULECIT) IV  125 mg Intravenous Daily  . finasteride  5 mg Oral Daily  . furosemide  40 mg Intravenous Once  . hydrALAZINE  10 mg Intravenous Once  . hydrALAZINE  10 mg Intravenous Once  . loratadine  10 mg Oral Daily  . mupirocin ointment  1 application Nasal BID  . sodium bicarbonate  1,300 mg Oral TID  . sodium chloride  3 mL Intravenous Q12H  . sodium chloride  3 mL Intravenous Q12H  . Tamsulosin HCl  0.4 mg Oral BID  . zolpidem  5 mg Oral Once    Assessment/ Plan:    Hypotension :  Resolved, he is actually hypertensive now  Hypertension: I suspect he will need dialysis.  This will likely help his hypertension.   Systolic CHF, chronic (123XX123) Echo  for today.  He has been noncompliant with his medications.   On coreg, hydralazine.  No on ACE-inhibitor due to end stage renal disease.  ESRD (end stage renal disease) (05/05/2012)  plan per int. Med.   Disposition: echo today. Length of Stay: 4  Thayer Headings, Brooke Bonito., MD, [facility] 09/21/2012, 8:01 AM Office [phone] Pager [phone]

## 2012-09-21 NOTE — OR Nursing (Signed)
OR Nursing Note: Ended first procedure (diatek ) at 1410.

## 2012-09-21 NOTE — H&P (Signed)
[facility] CONSULT NOTE 09/20/2012 DOB: 2046/01/06 MRN : [id number]   CC: ESRD Referring Physician: Geoffry Paradise, MD   History of Present Illness: Trevor [name] is a 66 y.o. male With history of end-stage renal disease (followed by nephrologist in [place]), cardiomyopathy ejection fraction 40% with myoview in September 2012 revealing apical thinning and global hypokinesis EF 40%. Presented to the ER because his blood pressure was elevated at home. He has had history of non compliance with recommended blood pressure regimen. Creatinine was 7.0 and hemoglobin was 7.8 with negative   Pt will Need HD soon per Dr. Cherlyn Cushing note and he asked [place] to evaluate for [facility] and perm access. Pt denies numbness/tingling or pain in either hand. He is right handed.   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  HTN (hypertension)     .  Anxiety     .  Cardiomyopathy     .  Renal insufficiency     .  Myocardial infarction     .  CVA (cerebral infarction)     .  Chest pain     .  Shortness of breath     .  Stroke     .  Headache     .  Arthritis         Past Surgical History   Procedure  Date   .  Tee without cardioversion  10/09/2011       Procedure: TRANSESOPHAGEAL ECHOCARDIOGRAM (TEE);  Surgeon: Lelon Perla, MD;  Location: [facility] ENDOSCOPY;  Service: Cardiovascular;  Laterality: N/A;   .  Right hand          ROS: [x]  Positive  [ ]  Denies      General: [ ]  Weight loss, [ ]  Fever, [ ]  chills Neurologic: [ ]  Dizziness, [ ]  Blackouts, [ ]  Seizure [ ]  Stroke, [ ]  "Mini stroke", [ ]  Slurred speech, [ ]  Temporary blindness; [ ]  weakness in arms or legs, [ ]  Hoarseness Cardiac: [ ]  Chest pain/pressure, [ ]  Shortness of breath at rest [x ] Shortness of breath with exertion, [ ]  Atrial fibrillation or irregular heartbeat Vascular: [ ]  Pain in legs with walking, [ ]  Pain in legs at rest, [ ]  Pain in legs at  night,  [ ]  Non-healing ulcer, [ ]  Blood clot in vein/DVT,    Pulmonary: [ ]  Home oxygen, [ ]  Productive cough, [ ]  Coughing up blood, [ ]  Asthma,  [ ]  Wheezing Musculoskeletal:  [ ]  Arthritis, [ ]  Low back pain, [ ]  Joint pain Hematologic: [ ]  Easy Bruising, [ ]  Anemia; [ ]  Hepatitis Gastrointestinal: [ ]  Blood in stool, [ ]  Gastroesophageal Reflux/heartburn, [ ]  Trouble swallowing Urinary: [x ] chronic Kidney disease, [ ]  on HD - [ ]  MWF or [ ]  TTHS, [ ]  Burning with urination, [ ]  Difficulty urinating Skin: [ ]  Rashes, [ ]  Wounds Psychological: [ ]  Anxiety, [ ]  Depression   Social History History   Substance Use Topics   .  Smoking status:  Current Every Day Smoker -- 0.5 packs/day for 50 years       Types:  Cigarettes   .  Smokeless tobacco:  Never Used   .  Alcohol Use:  Yes         Occasional      Family  History Family History   Problem  Relation  Age of Onset   .  Heart attack  Mother         MI in her 54s   .  Anesthesia problems  Neg Hx     .  Hypotension  Neg Hx     .  Malignant hyperthermia  Neg Hx     .  Pseudochol deficiency  Neg Hx        No Known Allergies    Current Facility-Administered Medications   Medication  Dose  Route  Frequency  Provider  Last Rate  Last Dose   .  acetaminophen (TYLENOL) tablet 650 mg   650 mg  Oral  Q6H PRN  Velvet Bathe, MD           Or   .  acetaminophen (TYLENOL) suppository 650 mg   650 mg  Rectal  Q6H PRN  Velvet Bathe, MD         .  aspirin chewable tablet 81 mg   81 mg  Oral  Daily  Ritta Slot, NP     81 mg at 09/20/12 0928   .  calcium acetate (PHOSLO) capsule 667 mg   667 mg  Oral  TID WC  Lucrezia Starch, MD     667 mg at 09/20/12 0631   .  Chlorhexidine Gluconate Cloth 2 % PADS 6 each   6 each  Topical  Q0600  Velvet Bathe, MD     6 each at 09/20/12 [phone]   .  darbepoetin (ARANESP) injection 200 mcg   200 mcg  Subcutaneous  Q Sun-1800  Lucrezia Starch, MD     200 mcg at 09/19/12 1729   .  DOPamine (INTROPIN) 800 mg  in dextrose 5 % 250 mL infusion   2.5 mcg/kg/min  Intravenous  Titrated  Thompson Grayer, MD  3.4 mL/hr at 09/19/12 1526  2.5 mcg/kg/min at 09/19/12 1526   .  ferric gluconate (NULECIT) 125 mg in sodium chloride 0.9 % 100 mL IVPB   125 mg  Intravenous  Daily  Lucrezia Starch, MD     125 mg at 09/19/12 1234   .  finasteride (PROSCAR) tablet 5 mg   5 mg  Oral  Daily  Ritta Slot, NP     5 mg at 09/20/12 0928   .  loratadine (CLARITIN) tablet 10 mg   10 mg  Oral  Daily  Ritta Slot, NP     10 mg at 09/20/12 0928   .  mupirocin ointment (BACTROBAN) 2 % 1 application   1 application  Nasal  BID  Velvet Bathe, MD     1 application at [phone]   .  nitroGLYCERIN (NITROSTAT) SL tablet 0.4 mg   0.4 mg  Sublingual  Q5 min PRN  Ritta Slot, NP         .  ondansetron (ZOFRAN) tablet 4 mg   4 mg  Oral  Q6H PRN  Velvet Bathe, MD     4 mg at 09/18/12 1644     Or   .  ondansetron (ZOFRAN) injection 4 mg   4 mg  Intravenous  Q6H PRN  Velvet Bathe, MD         .  sodium bicarbonate injection 50 mEq   50 mEq  Intravenous  Once  Lucrezia Starch, MD     50 mEq at 09/19/12 1238   .  sodium  bicarbonate tablet 1,300 mg   1,300 mg  Oral  TID  Ritta Slot, NP     1,300 mg at 09/20/12 0928   .  sodium chloride 0.9 % injection 3 mL   3 mL  Intravenous  Q12H  Velvet Bathe, MD     3 mL at 09/20/12 0928   .  sodium chloride 0.9 % injection 3 mL   3 mL  Intravenous  Q12H  Ritta Slot, NP     3 mL at 09/20/12 0929   .  Tamsulosin HCl (FLOMAX) capsule 0.4 mg   0.4 mg  Oral  BID  Ritta Slot, NP     0.4 mg at 09/20/12 0928   .  DISCONTD: DOPamine (INTROPIN) 800 mg in dextrose 5 % 250 mL infusion   5 mcg/kg/min  Intravenous  Titrated  Thompson Grayer, MD  6.8 mL/hr at 09/19/12 0800  5 mcg/kg/min at 09/19/12 0800       Imaging: USG abdomen   Significant Diagnostic Studies: CBC Lab Results   Component  Value  Date     WBC  8.7  09/18/2012     HGB  7.6*  09/18/2012     HCT  22.3*  09/18/2012     MCV  83.5  09/18/2012      PLT  169  09/18/2012      BMET    Component  Value  Date/Time     NA  140  09/20/2012 0451     K  4.9  09/20/2012 0451     CL  114*  09/20/2012 0451     CO2  15*  09/20/2012 0451     GLUCOSE  87  09/20/2012 0451     BUN  78*  09/20/2012 0451     CREATININE  7.45*  09/20/2012 0451     CALCIUM  7.9*  09/20/2012 0451     GFRNONAA  7*  09/20/2012 0451     GFRAA  8*  09/20/2012 0451      COAG Lab Results   Component  Value  Date     INR  1.22  09/17/2012     INR  0.97  10/07/2011     INR  1.1*  08/07/2011    No results found for this basename: PTT        Physical Examination BP Readings from Last 3 Encounters:   09/20/12  171/80   09/08/12  195/96   05/08/12  153/61    Temp Readings from Last 3 Encounters:   09/20/12  96.8 F (36 C) Rectal   05/08/12  98.6 F (37 C) Oral   10/10/11  98.3 F (36.8 C) Oral    SpO2 Readings from Last 3 Encounters:   09/20/12  96%   05/08/12  97%   10/10/11  99%    Pulse Readings from Last 3 Encounters:   09/20/12  94   09/08/12  98   05/07/12  87      General:  WDWN in NAD HENT: WNL Eyes: Pupils equal Pulmonary: normal non-labored breathing , without Rales, rhonchi,  wheezing Cardiac: RRR, without  Murmurs, rubs or gallops; No carotid bruits Abdomen: soft, NT, no masses Skin: no rashes, ulcers noted Vascular Exam/Pulses: 2+ radial and ulnar pulses left Both hands warm with good grip, normal sensation Extremities without ischemic changes, no Gangrene , no cellulitis; no open wounds;  Musculoskeletal: no muscle wasting or atrophy  Neurologic: A&O X 3; Appropriate Affect ;   Speech is fluent/normal   Non-Invasive Vascular Imaging: VM pending USG abd pending   ASSESSMENT/PLAN: ARAV SCROGGIN is a 66 y.o. male Now with ESRD in need of TDC and perm HD access Vein Mapping pending HX small, asymptomatic AAA <3.0 CM 1 yr ago - new usg pend I have examined the patient, reviewed and agree with above. I have  reviewed the vein map. The patient has a small cephalic veins bilaterally with moderate basilic vein. I discussed the need for temporary and permanent access for hemodialysis. The patient is scheduled tomorrow for Dr. Scot Dock for a hemodialysis catheter and a left arm access either AV fistula or AV graft. The patient understands determination be made depending on vein size at surgery.   EARLY, TODD, MD 09/20/2012 6:12 PM     Revision History...     Date/Time User Action   09/20/2012 6:13 PM Rosetta Posner, MD Sign   09/20/2012 10:21 AM Richrd Prime, PA Sign  View Details Report   Routing History...     Date/Time From To Method   09/20/2012 6:13 PM Rosetta Posner, MD Rosetta Posner, MD In Basket      No change   Judeth Cornfield. Scot Dock, [place], [place] [phone] 09/21/2012'

## 2012-09-21 NOTE — OR Nursing (Signed)
Started second procedure (Left arm arteriovenous fistula) at 1430.

## 2012-09-21 NOTE — Progress Notes (Signed)
[facility] PROGRESS NOTE  Trevor [name] [id number] DOB: Jun 08, 1946 DOA: 09/17/2012 PCP: William Hamburger, MD  Assessment/Plan: 1. Hypotension - At this point medication vs transfusion hypotensive reaction.  Most likely related to medications though and as such agree with continuing patient's antihypertensive medication slowly and one at a time. - Pressors discontinued 10/21 in the AM due to improved blood pressure. - Resolved at this point  2. Hypertension - Started Coreg 10/21. Blood pressures fluctuated from 197/92- 174/87 - Start patient on amlodipine 09/21/12 for elevated blood pressure.  Will start with 5 mg po and if still elevated will consider increasing to 10 mg po daily.  3. Anemia - Pt is s/p one unit PRBC with recent hypotensive episode while transfusion was being administered. - Last hgb 8.8 - Most likely related to CKD - Continue to monitor closely with daily cbc's no active bleeding currently  3. ESRD - Nephrology on board and currently helping make further medical decisions. - Pt set up for vein mapping for future HD sessions - PTH ordered and phosphate binders ordered. - Currently on Bicarb for Acidosis likely related to ESRD. - Can't thank Nephrology enough for help with this case and management of this patient.  4. Tobacco abuse - recommend tobacco cessation - Continue nicotine patch  5. Chest pain  - Troponins negative x 3 - Cardiology on board - No chest pain reported this am - Resolved  Code Status: Full Family Communication: Spoke with patient this am. Disposition Plan: Pending improvement of blood pressure as well as continued recommendations from nephrology.   Consultants:  Cardiology: Velora Heckler  Nephrology: [facility]  Procedures:  None  Antibiotics:  None  HPI/Subjective: Patient reportedly had difficulty sleeping and required medication due to elevated blood pressures.  No new complaints this am and patient is  resting comfortably.  Objective: Filed Vitals:   09/21/12 0500 09/21/12 0501 09/21/12 0722 09/21/12 0800  BP: 170/76  173/85 174/87  Pulse: 90  82 80  Temp:  98.3 F (36.8 C) 98.6 F (37 C)   TempSrc:  Oral Oral   Resp: 23  19 21   Height:      Weight:      SpO2: 99%   95%    Intake/Output Summary (Last 24 hours) at 09/21/12 0858 Last data filed at 09/21/12 0700  Gross per 24 hour  Intake    450 ml  Output   2250 ml  Net  -1800 ml   Filed Weights   09/18/12 1800 09/19/12 0600 09/20/12 0336  Weight: 71 kg (156 lb 8.4 oz) 72 kg (158 lb 11.7 oz) 72 kg (158 lb 11.7 oz)    Exam:   General:  Pt in NAD, Alert and Awake  Cardiovascular: RRR, No MRG  Respiratory: CTA BL, no wheezes  Abdomen: Soft, NT, ND  Data Reviewed: Basic Metabolic Panel:  Lab [id number] 0430 09/20/12 0451 09/19/12 0505 09/18/12 2040 09/18/12 0545 09/17/12 1753  NA 144 140 139 135 141 --  K 5.0 4.9 4.6 5.0 4.9 --  CL 113* 114* 112 113* 115* --  CO2 15* 15* 12* 9* 12* --  GLUCOSE 83 87 145* 309* 86 --  BUN 79* 78* 80* 73* 73* --  CREATININE 7.27* 7.45* 6.87* 6.79* 6.97* --  CALCIUM 8.7 7.9* 7.7* 7.2* 7.9* --  MG -- -- -- -- -- --  PHOS 5.2* 5.5* -- -- -- 5.8*   Liver Function Tests:  Lab 09/21/12 0430 09/20/12 0451 09/17/12 1753  AST -- -- --  ALT -- -- --  ALKPHOS -- -- --  BILITOT -- -- --  PROT -- -- --  ALBUMIN 3.1* 2.8* 3.3*   No results found for this basename: LIPASE:5,AMYLASE:5 in the last 168 hours No results found for this basename: AMMONIA:5 in the last 168 hours CBC:  Lab 09/21/12 0430 09/18/12 2040 09/18/12 1730 09/18/12 1654 09/18/12 0545 09/17/12 1327  WBC 7.4 8.7 -- -- 5.6 5.9  NEUTROABS -- -- -- -- 3.2 3.9  HGB 8.8* 7.6* 6.9* 6.8* 7.1* --  HCT 25.9* 22.3* 19.9* 20.6* 20.4* --  MCV 82.7 83.5 -- -- 85.4 85.4  PLT 178 169 -- -- 162 198   Cardiac Enzymes:  Lab 09/19/12 0948 09/19/12 0403 09/18/12 2326 09/18/12 2039 09/17/12 1610  CKTOTAL -- -- -- -- --  CKMB -- --  -- -- --  CKMBINDEX -- -- -- -- --  TROPONINI <0.30 <0.30 <0.30 <0.30 <0.30   BNP (last 3 results)  Basename 05/05/12 2302  PROBNP 32672.0*   CBG: No results found for this basename: GLUCAP:5 in the last 168 hours  Recent Results (from the past 240 hour(s))  MRSA PCR SCREENING     Status: Abnormal   Collection Time   09/18/12  6:14 PM      Component Value Range Status Comment   MRSA by PCR POSITIVE (*) NEGATIVE Final      Studies: Dg Chest Port 1 View  09/21/2012  *RADIOLOGY REPORT*  Clinical Data: Short of breath, cough  PORTABLE CHEST - 1 VIEW  Comparison: Chest radiograph 09/17/2012  Findings: Normal cardiac silhouette.  There is mild interstitial pattern at the lung bases increased compared to prior.  No pneumothorax.  IMPRESSION:  Fine interstitial edema pattern at the lung bases representing interstitial edema versus atypical infection or early pulmonary edema.   Original Report Authenticated By: Suzy Bouchard, M.D.    [place] Retroperitoneal Comp  09/20/2012  *RADIOLOGY REPORT*  Clinical Data: Renal failure.  Evaluate for enlarging AAA.  RENAL/URINARY TRACT ULTRASOUND COMPLETE  Comparison:  09/04/2011.  Findings:  Right Kidney:  Measures 9.7 cm.  Increased parenchymal echogenicity.  Anechoic lesions with increased through transmission measure up to 2.8 x 2.5 x 1.8 cm, consistent with cysts. Parenchymal echogenicity is increased.  No hydronephrosis.  Left Kidney:  Measures 9.5 cm.  Parenchymal echogenicity is increased.  No hydronephrosis.  No focal lesions.  Additional finding:  Aorta measures up to 2.7 x 2.5 cm.  Right common iliac artery measures 1.5 cm and left common iliac artery, 1.3 cm.  There are bilateral pleural effusions.  A 2.8 cm shadowing gallstone is noted as well.  IMPRESSION:  1.  Increased renal parenchymal echogenicity is consistent with chronic medical renal disease. 2.  No abdominal aortic aneurysm. 3.  Bilateral pleural effusions. 4.  Cholelithiasis.   Original  Report Authenticated By: Luretha Rued, M.D.     Scheduled Meds:    . aspirin  81 mg Oral Daily  . calcium acetate  667 mg Oral TID WC  . carvedilol  12.5 mg Oral BID WC  . Chlorhexidine Gluconate Cloth  6 each Topical Q0600  . darbepoetin (ARANESP) injection - NON-DIALYSIS  200 mcg Subcutaneous Q Sun-1800  . ferric gluconate (FERRLECIT/NULECIT) IV  125 mg Intravenous Daily  . finasteride  5 mg Oral Daily  . furosemide  40 mg Intravenous Once  . hydrALAZINE  10 mg Intravenous Once  . hydrALAZINE  10 mg Intravenous Once  . loratadine  10 mg Oral Daily  .  mupirocin ointment  1 application Nasal BID  . sodium bicarbonate  1,300 mg Oral TID  . sodium chloride  3 mL Intravenous Q12H  . sodium chloride  3 mL Intravenous Q12H  . Tamsulosin HCl  0.4 mg Oral BID  . zolpidem  5 mg Oral Once   Continuous Infusions:    . DISCONTD: DOPamine Stopped (09/20/12 0900)    Active Problems:  Hypotension transfusion mediated  Renal insufficiency  Systolic CHF, chronic  ESRD (end stage renal disease)    Time spent: > 35 minutes    Velvet Bathe  [facility] Pager [phone] If 8PM-8AM, please contact night-coverage at [email or web address], password [facility] 09/21/2012, 8:58 AM  LOS: 4 days

## 2012-09-21 NOTE — Preoperative (Signed)
Beta Blockers   Reason not to administer Beta Blockers:Not Applicable 

## 2012-09-21 NOTE — Progress Notes (Signed)
[facility]  Subjective:  Awake, alert, hungry, knows he's going to OR today for dialysis access     Objective: Vital signs in last 24 hours: Blood pressure 185/83, pulse 80, temperature 98.6 F (37 C), temperature source Oral, resp. rate 21, height 5\' 10"  (1.778 m), weight 72 kg (158 lb 11.7 oz), SpO2 95.00%.    PHYSICAL EXAM General--as above  Chest--clear Heart--no rub Abd--nontender Extr--no edema  Lab Results:   Lab 09/21/12 0430 09/20/12 0451 09/19/12 0505 09/17/12 1753  NA 144 140 139 --  K 5.0 4.9 4.6 --  CL 113* 114* 112 --  CO2 15* 15* 12* --  BUN 79* 78* 80* --  CREATININE 7.27* 7.45* 6.87* --  ALB -- -- -- --  GLUCOSE 83 -- -- --  CALCIUM 8.7 7.9* 7.7* --  PHOS 5.2* 5.5* -- 5.8*     Basename 09/21/12 0430 09/18/12 2040  WBC 7.4 8.7  HGB 8.8* 7.6*  HCT 25.9* 22.3*  PLT 178 169     I have reviewed the patient's current medications.   Assessment/Plan: 1. ESRD--access today, HD today, plan outpt HD  2. HTN--BP rising--back on some of home meds--BP likely to decrease with dialysis 3. Anemia with iron deficiency--on aranesp and iv Fe  4. Metabolic acidosis--bicarb still low (15)--on bicarb tabs 1300 mg BID.  This will be d/c'd after he's had a few dialysis treatments  5. H/O CVA--per primary  6. H/O AAA (noted by [facility]) < 3 cm 7 Hyperphosphatemia--phosphorus now in goal range (< 5.5) on  Calcium acetate 667 TID wit meals. PTH 330--will begin zemplar 8. Cardiomyopathy with EF 40% and prev abnormal stress test ([place])--echo yesterday:  Normal LV size, EF 30-35%, mod.    Diastolic dysfunction, no valvular dysfct.       LOS: 4 days   Monte Zinni F 09/21/2012,10:03 AM   .labalb

## 2012-09-22 ENCOUNTER — Inpatient Hospital Stay (HOSPITAL_COMMUNITY): Payer: Medicare HMO

## 2012-09-22 ENCOUNTER — Other Ambulatory Visit: Payer: Self-pay | Admitting: *Deleted

## 2012-09-22 ENCOUNTER — Telehealth: Payer: Self-pay | Admitting: Vascular Surgery

## 2012-09-22 DIAGNOSIS — Z4931 Encounter for adequacy testing for hemodialysis: Secondary | ICD-10-CM

## 2012-09-22 DIAGNOSIS — T82898A Other specified complication of vascular prosthetic devices, implants and grafts, initial encounter: Secondary | ICD-10-CM

## 2012-09-22 DIAGNOSIS — I4891 Unspecified atrial fibrillation: Secondary | ICD-10-CM

## 2012-09-22 DIAGNOSIS — I509 Heart failure, unspecified: Secondary | ICD-10-CM

## 2012-09-22 DIAGNOSIS — I5022 Chronic systolic (congestive) heart failure: Secondary | ICD-10-CM

## 2012-09-22 LAB — TYPE AND SCREEN
ABO/RH(D): B POS
Antibody Screen: NEGATIVE
Unit division: 0
Unit division: 0

## 2012-09-22 LAB — BASIC METABOLIC PANEL
Chloride: 106 mEq/L (ref 96–112)
GFR calc Af Amer: 12 mL/min — ABNORMAL LOW (ref >90)
GFR calc non Af Amer: 11 mL/min — ABNORMAL LOW (ref >90)
Glucose, Bld: 114 mg/dL — ABNORMAL HIGH (ref 70–99)
Potassium: 3.9 mEq/L (ref 3.5–5.1)
Sodium: 139 mEq/L (ref 135–145)

## 2012-09-22 LAB — HEPATITIS C VRS RNA DETECT BY PCR-QUAL: Hepatitis C Vrs RNA by PCR-Qual: POSITIVE — AB

## 2012-09-22 LAB — CBC
Hemoglobin: 9.4 g/dL — ABNORMAL LOW (ref 13.0–17.0)
MCHC: 34.4 g/dL (ref 30.0–36.0)
WBC: 6.4 10*3/uL (ref 4.0–10.5)

## 2012-09-22 LAB — HEPATITIS B SURFACE ANTIBODY,QUALITATIVE: Hep B S Ab: NEGATIVE

## 2012-09-22 MED ORDER — HEPARIN SODIUM (PORCINE) 1000 UNIT/ML DIALYSIS
20.0000 [IU]/kg | INTRAMUSCULAR | Status: DC | PRN
  Filled 2012-09-22: qty 2

## 2012-09-22 MED ORDER — HYDRALAZINE HCL 20 MG/ML IJ SOLN
10.0000 mg | INTRAMUSCULAR | Status: DC | PRN
  Administered 2012-09-22 – 2012-09-30 (×3): 10 mg via INTRAVENOUS
  Filled 2012-09-22 (×4): qty 0.5

## 2012-09-22 NOTE — Progress Notes (Signed)
PROGRESS NOTE  Primary Cardiologist:  Kirk Ruths, MD  Subjective:   Trevor [name] is a 66 yo with hx of nonischemic CM, CKD IV, HTN admitted with elevated BP, anemia and worsening renal fxn.  He developed hypotension after restarting his meds.  The suspecion was that he was not actually taking his medications at home.    Echo shows LV EF of 99991111, diastolic dysfunction,  He had a dialysis graft placed .  Objective:    Vital Signs:   Temp:  [97 F (36.1 C)-98.1 F (36.7 C)] 98.1 F (36.7 C) (10/23 0422) Pulse Rate:  [63-96] 93  (10/23 0700) Resp:  [12-22] 16  (10/23 0700) BP: (115-202)/(70-96) 169/96 mmHg (10/23 0700) SpO2:  [95 %-100 %] 99 % (10/23 0700) Weight:  [142 lb 3.2 oz (64.5 kg)-145 lb 4.5 oz (65.9 kg)] 142 lb 3.2 oz (64.5 kg) (10/23 0034)  Last BM Date: 09/18/12   24-hour weight change: Weight change:   Weight trends: Filed Weights   09/20/12 0336 09/21/12 2115 09/22/12 0034  Weight: 158 lb 11.7 oz (72 kg) 145 lb 4.5 oz (65.9 kg) 142 lb 3.2 oz (64.5 kg)    Intake/Output:  10/22 0701 - 10/23 0700 In: 420 [P.O.:120; I.V.:300] Out: 1643 [Urine:450; Stool:1]     Physical Exam: BP 169/96  Pulse 93  Temp 98.1 F (36.7 C) (Oral)  Resp 16  Ht 5\' 10"  (1.778 m)  Wt 142 lb 3.2 oz (64.5 kg)  BMI 20.40 kg/m2  SpO2 99%  General: Vital signs reviewed and noted. Chronically ill appearing  Head: Normocephalic, atraumatic.  Eyes: conjunctivae/corneas clear.  EOM's intact.   Throat: normal  Neck: Supple. Normal carotids. No JVD  Lungs:  Clear   Heart: Regular rate,  With normal  S1 S2. No murmurs,   Abdomen:  Soft, non-tender, non-distended with normoactive bowel sounds. No hepatomegaly. No rebound/guarding. No abdominal masses.  Extremities: Distal pedal pulses are 2+ .  No edema.    Neurologic: A&O X3, CN II - XII are grossly intact. Motor strength is 5/5 in the all 4 extremities.  Psych: Responds to questions appropriately with normal affect.     Labs: BMET:  Basename 09/22/12 0430 09/21/12 0430 09/20/12 0451  NA 139 144 --  K 3.9 5.0 --  CL 106 113* --  CO2 21 15* --  GLUCOSE 114* 83 --  BUN 52* 79* --  CREATININE 5.07* 7.27* --  CALCIUM 8.3* 8.7 --  MG -- -- --  PHOS -- 5.2* 5.5*    Liver function tests:  Basename 09/21/12 0430 09/20/12 0451  AST -- --  ALT -- --  ALKPHOS -- --  BILITOT -- --  PROT -- --  ALBUMIN 3.1* 2.8*   No results found for this basename: LIPASE:2,AMYLASE:2 in the last 72 hours  CBC:  Basename 09/22/12 0430 09/21/12 0430  WBC 6.4 7.4  NEUTROABS -- --  HGB 9.4* 8.8*  HCT 27.3* 25.9*  MCV 83.7 82.7  PLT 164 178    Cardiac Enzymes:  Basename 09/19/12 0948  CKTOTAL --  CKMB --  TROPONINI <0.30    Coagulation Studies: No results found for this basename: LABPROT:5,INR:5 in the last 72 hours  Basename 09/20/12 0451  VITAMINB12 --  FOLATE --  FERRITIN 395*  TIBC --  IRON --  RETICCTPCT --     Tele: 09/20/12- NSR  at 91  Medications:    Infusions:    Scheduled Medications:    . sodium chloride  Intravenous Once  . amLODipine  5 mg Oral Daily  . aspirin  81 mg Oral Daily  . calcium acetate  667 mg Oral TID WC  . carvedilol  12.5 mg Oral BID WC  . Chlorhexidine Gluconate Cloth  6 each Topical Q0600  . darbepoetin (ARANESP) injection - NON-DIALYSIS  200 mcg Subcutaneous Q Sun-1800  . ferric gluconate (FERRLECIT/NULECIT) IV  125 mg Intravenous Daily  . finasteride  5 mg Oral Daily  . loratadine  10 mg Oral Daily  . mupirocin ointment  1 application Nasal BID  . paricalcitol  2 mcg Intravenous 3 times weekly  . sodium bicarbonate  1,300 mg Oral TID  . sodium chloride  3 mL Intravenous Q12H  . sodium chloride  3 mL Intravenous Q12H  . Tamsulosin HCl  0.4 mg Oral BID  . DISCONTD: carvedilol  12.5 mg Oral BID WC    Assessment/ Plan:    Hypotension :  Resolved, he is actually hypertensive now  Hypertension: I suspect he will need dialysis.  This will  likely help his hypertension.  Question For Renal- Should we continue to avoid ACE-inhibitor / ARB ?  On option would be to increase his Norvasc to 10 QD.  I will defer to renal - I would not want to treat him so aggressively that he becomes hypotensive during dialysis.  Systolic CHF, chronic ([id number]) Echo shows EF of 30-35%.   He has been noncompliant with his medications.   On coreg, hydralazine.  No on ACE-inhibitor due to end stage renal disease.  Will see if his EF improves with dialysis.   ESRD (end stage renal disease) (05/05/2012)  plan per int. Med.   Disposition:  Length of Stay: 5  Thayer Headings, Brooke Bonito., MD, [facility] 09/22/2012, 7:52 AM Office [phone] Pager [phone]

## 2012-09-22 NOTE — Progress Notes (Addendum)
[facility] Progress Note [place] TEAM 1 - Stepdown/ICU TEAM   Trevor [name] [id number] DOB: 1946-05-03 DOA: 09/17/2012 PCP: William Hamburger, MD  Brief narrative: Mr. Flitter is a 66 yo with hx of nonischemic CM, CKD IV, HTN admitted with elevated BP, anemia and worsening renal fxn. He developed hypotension after restarting his meds. The suspecion was that he was not actually taking his medications at home.   Assessment/Plan:  HTN / Hypotension Elevated BP is currently the issue - hopefully this will improve w/ ongoing HD - will make no changes in medical tx today and follow trend  Anemia Due to chronic renal disease - Fe and aranesp as per Nephrology  ESRD Has begin HD - Nephrology is following   Tobacco abuse Counseled on need to abstain  Chest pain Resolved - Cardiology has seen him  Systolic CHF Volume control via HD   Hepatitis C Will check LFTs and viral load to begin assessment - given compliance issues, he may not be a candidate for tx even if it is indicated  Code Status: Full Disposition Plan: Stable for transfer to 6700 - Med/Surg  Consultants: Nephrology Cardiology Vasc Surgery  Procedures: 10/22 - Ultrasound guided placement of right IJ Diatek catheter & Left basilic vein transposition  10/21 - Echo shows LV EF of 99991111, diastolic dysfunction  Antibiotics: none  DVT prophylaxis: SCDs  HPI/Subjective: The patient is resting on hemodialysis at the time of my visit.  He denies chest pain fevers chills nausea vomiting or abdominal pain.  He has no questions concerning the plan of his care.   Objective: Blood pressure 144/74, pulse 78, temperature 99.1 F (37.3 C), temperature source Oral, resp. rate 16, height 5\' 10"  (1.778 m), weight 64.9 kg (143 lb 1.3 oz), SpO2 98.00%.  Intake/Output Summary (Last 24 hours) at 09/22/12 1417 Last data filed at 09/22/12 0900  Gross per 24 hour  Intake    660 ml  Output   1193 ml  Net   -533 ml      Exam: General: No acute respiratory distress Lungs: Clear to auscultation bilaterally without wheezes or crackles Cardiovascular: Regular rate and rhythm without murmur gallop or rub Abdomen: Nontender, nondistended, soft, bowel sounds positive, no rebound, no ascites, no appreciable mass Extremities: No significant cyanosis, clubbing, or edema bilateral lower extremities  Data Reviewed: Basic Metabolic Panel:  Lab [id number] 0430 09/21/12 0430 09/20/12 0451 09/19/12 0505 09/18/12 2040 09/17/12 1753  NA 139 144 140 139 135 --  K 3.9 5.0 4.9 4.6 5.0 --  CL 106 113* 114* 112 113* --  CO2 21 15* 15* 12* 9* --  GLUCOSE 114* 83 87 145* 309* --  BUN 52* 79* 78* 80* 73* --  CREATININE 5.07* 7.27* 7.45* 6.87* 6.79* --  CALCIUM 8.3* 8.7 7.9* 7.7* 7.2* --  MG -- -- -- -- -- --  PHOS -- 5.2* 5.5* -- -- 5.8*   Liver Function Tests:  Lab 09/21/12 0430 09/20/12 0451 09/17/12 1753  AST -- -- --  ALT -- -- --  ALKPHOS -- -- --  BILITOT -- -- --  PROT -- -- --  ALBUMIN 3.1* 2.8* 3.3*   CBC:  Lab 09/22/12 0430 09/21/12 0430 09/18/12 2040 09/18/12 1730 09/18/12 1654 09/18/12 0545 09/17/12 1327  WBC 6.4 7.4 8.7 -- -- 5.6 5.9  NEUTROABS -- -- -- -- -- 3.2 3.9  HGB 9.4* 8.8* 7.6* 6.9* 6.8* -- --  HCT 27.3* 25.9* 22.3* 19.9* 20.6* -- --  MCV 83.7 82.7  83.5 -- -- 85.4 85.4  PLT 164 178 169 -- -- 162 198   Cardiac Enzymes:  Lab 09/19/12 0948 09/19/12 0403 09/18/12 2326 09/18/12 2039 09/17/12 1610  CKTOTAL -- -- -- -- --  CKMB -- -- -- -- --  CKMBINDEX -- -- -- -- --  TROPONINI <0.30 <0.30 <0.30 <0.30 <0.30   BNP (last 3 results)  Basename 05/05/12 2302  PROBNP 32672.0*    Recent Results (from the past 240 hour(s))  MRSA PCR SCREENING     Status: Abnormal   Collection Time   09/18/12  6:14 PM      Component Value Range Status Comment   MRSA by PCR POSITIVE (*) NEGATIVE Final      Studies:  Recent x-ray studies have been reviewed in detail by the Attending  Physician  Scheduled Meds:  Reviewed in detail by the Attending Physician   [name], MD [facility] Office  [phone] Pager [phone]  On-Call/Text Page:      [email or web address]      password [email or web address]  If 7PM-7AM, please contact night-coverage [email or web address] Password [email or web address] 09/22/2012, 2:17 PM   LOS: 5 days

## 2012-09-22 NOTE — Progress Notes (Signed)
Pt back from HD.  Report called by Tommi Rumps, RN.

## 2012-09-22 NOTE — Progress Notes (Signed)
[facility]  Subjective:  Awake, alert, eating breakfast Says L arm is sore   Objective: Vital signs in last 24 hours: Blood pressure 169/96, pulse 93, temperature 98.1 F (36.7 C), temperature source Oral, resp. rate 16, height 5\' 10"  (1.778 m), weight 64.5 kg (142 lb 3.2 oz), SpO2 99.00%.    PHYSICAL EXAM General--as above Chest--clear, R IJ tunneled HD cath Heart--no rub Abd--nontender Extr--no edema, new AVF L upper arm  Lab Results:   Lab 09/22/12 0430 09/21/12 0430 09/20/12 0451 09/17/12 1753  NA 139 144 140 --  K 3.9 5.0 4.9 --  CL 106 113* 114* --  CO2 21 15* 15* --  BUN 52* 79* 78* --  CREATININE 5.07* 7.27* 7.45* --  ALB -- -- -- --  GLUCOSE 114* -- -- --  CALCIUM 8.3* 8.7 7.9* --  PHOS -- 5.2* 5.5* 5.8*     Basename 09/22/12 0430 09/21/12 0430  WBC 6.4 7.4  HGB 9.4* 8.8*  HCT 27.3* 25.9*  PLT 164 178   I have reviewed the patient's current medications.  Assessment/Plan: 1. ESRD-- HD again today, plan outpt HD (approval given from [place] for outpt dialysis--according to Sherlon Handing, RN--Case Mgr)--he lives near [place]  so [facility] will be closest for him 2. HTN--BP rising--back on some of home meds--BP likely to decrease with dialysis--plan to decrease weight to 60 kg. Today in HD.    If BP still high ACE inhib is good choice.  Lisinopril is dialyzed so it should be given post-HD  3. Anemia with iron deficiency--on aranesp and iv Fe. I-stat in HD today.  4. Metabolic acidosis--bicarb now 21--will d/c po bicarb 5. H/O CVA--per primary  6. H/O AAA-- (noted by [facility]) < 3 cm  7 Hyperphosphatemia--phosphorus now in goal range (< 5.5) on Calcium acetate 667 TID wit meals. PTH 330--now on zemplar  8. Cardiomyopathy with EF 40% and prev abnormal stress test ([place])--echo : Normal LV size, EF 30-35%, mod. Diastolic dysfunction, no valvular dysfct.      LOS: 5 days   Kathrina Crosley F 09/22/2012,9:00 AM   .labalb

## 2012-09-22 NOTE — Progress Notes (Signed)
Pt transferred to HD 

## 2012-09-22 NOTE — Progress Notes (Signed)
Pt report called to Caryl Pina, RN on 6700.  Pt transferred via bed with RN and NT.  No IV, O2, or tele.  Loleta Dicker, RN

## 2012-09-22 NOTE — Telephone Encounter (Addendum)
Message copied by Lujean Amel on Wed Sep 22, 2012 11:23 AM ------      Message from: Alfonso Patten      Created: Wed Sep 22, 2012  9:07 AM      Regarding: FW: charge and f/u                   ----- Message -----         From: Angelia Mould, MD         Sent: 09/21/2012   4:28 PM           To: Patrici Ranks, Alfonso Patten, RN      Subject: charge and f/u                                           PROCEDURE:       1. Ultrasound guided placement of right IJ Diatek catheter      2. Left basilic vein transposition            SURGEON: Judeth Cornfield. Scot Dock, MD, FACS            ASSIST: Gerri Lins PA            He will need a follow up visit in 6 weeks to check on the maturation of this fistula. He needs a duplex when he comes in. Thank you. CSD  I scheduled an appt for the patient on 11/03/12 at 1pm for a duplex and to see CSD. I left a message for the pt and also mailed an appt letter.awt

## 2012-09-22 NOTE — Progress Notes (Signed)
VASCULAR PROGRESS NOTE  SUBJECTIVE: No complaints  PHYSICAL EXAM: Filed Vitals:   09/22/12 0422 09/22/12 0500 09/22/12 0600 09/22/12 0700  BP: 172/75 176/78 178/71 169/96  Pulse: 92 86 90 93  Temp: 98.1 F (36.7 C)     TempSrc:      Resp: 19 18 18 16   Height:      Weight:      SpO2: 99% 99% 98% 99%   Excellent thrill in left basilic vein transposition. Palpable left radial pulse.   ASSESSMENT/PLAN: 1. 1 Day Post-Op s/p: Left basilic vein transposition. 2. Doing well. Will arrange follow up in 6 weeks. Will be available as needed.[name], MD, FACS Beeper: [phone] 09/22/2012

## 2012-09-23 LAB — HEPATITIS B CORE ANTIBODY, IGM: Hep B C IgM: NEGATIVE

## 2012-09-23 NOTE — Progress Notes (Signed)
CLIP process began. Face sheet, hepatitis status, CXR, recent lab results and H&P sent to frsenius clip office.

## 2012-09-23 NOTE — Progress Notes (Signed)
[facility] Progress Note Trevor [name] 1 - Stepdown/ICU [name]   Trevor [name] [id number] DOB: 1946/04/04 DOA: 09/17/2012 [facility]   Brief narrative: [name] is a 66 yo with hx of nonischemic CM, CKD IV, HTN admitted with elevated BP, anemia and worsening renal fxn. He developed hypotension after restarting his meds. The suspecion was that he was not actually taking his medications at home. He went on to develop ARF and now is deemed ESRd and HD dependent   Assessment/Plan:  HTN / Hypotension BP was very labile during admission -  Upon presentation BP was elevated then it dropped - then went up again . Better control after initiation of HD     Anemia Due to chronic renal disease - Fe and aranesp as per Nephrology  ESRD Has begun HD - Nephrology is following   Tobacco abuse Counseled on need to abstain   Acute on chronic Systolic CHF Volume control via HD   Hepatitis C Will check LFTs and viral load to begin assessment - given compliance issues, he may not be a candidate for tx even if it is indicated  Code Status: Full Disposition Plan: snf  Consultants: Nephrology Cardiology Vasc Surgery  Procedures: 10/22 - Ultrasound guided placement of right IJ Diatek catheter & Left basilic vein transposition  10/21 - Echo shows LV EF of 99991111, diastolic dysfunction  Antibiotics: none  DVT prophylaxis: SCDs  HPI/Subjective: No complains    Objective: Blood pressure 160/72, pulse 86, temperature 98.8 F (37.1 C), temperature source Oral, resp. rate 18, height 5\' 10"  (1.778 m), weight 63 kg (138 lb 14.2 oz), SpO2 99.00%.  Intake/Output Summary (Last 24 hours) at 09/23/12 0836 Last data filed at 09/23/12 0517  Gross per 24 hour  Intake    340 ml  Output      0 ml  Net    340 ml    Patient Vitals for the past 24 hrs:  BP Temp Temp src Pulse Resp SpO2  09/23/12 1632 141/58 mmHg 99 F (37.2 C) - 72  17  98 %  09/23/12 1307 142/70 mmHg 98 F  (36.7 C) Oral 80  16  98 %  09/23/12 1004 155/65 mmHg 97.4 F (36.3 C) Oral 78  18  98 %  09/23/12 0600 160/72 mmHg 98.8 F (37.1 C) - 86  18  99 %  09/22/12 2051 156/61 mmHg 99.2 F (37.3 C) Oral 83  16  100 %     Exam: General: No acute respiratory distress Lungs: Clear to auscultation bilaterally without wheezes or crackles Cardiovascular: Regular rate and rhythm without murmur gallop or rub Abdomen: Nontender, nondistended, soft, bowel sounds positive, no rebound, no ascites, no appreciable mass Extremities: No significant cyanosis, clubbing, or edema bilateral lower extremities  Data Reviewed: Basic Metabolic Panel:  Lab [id number] 0430 09/21/12 0430 09/20/12 0451 09/19/12 0505 09/18/12 2040 09/17/12 1753  NA 139 144 140 139 135 --  K 3.9 5.0 4.9 4.6 5.0 --  CL 106 113* 114* 112 113* --  CO2 21 15* 15* 12* 9* --  GLUCOSE 114* 83 87 145* 309* --  BUN 52* 79* 78* 80* 73* --  CREATININE 5.07* 7.27* 7.45* 6.87* 6.79* --  CALCIUM 8.3* 8.7 7.9* 7.7* 7.2* --  MG -- -- -- -- -- --  PHOS -- 5.2* 5.5* -- -- 5.8*   Liver Function Tests:  Lab 09/21/12 0430 09/20/12 0451 09/17/12 1753  AST -- -- --  ALT -- -- --  ALKPHOS -- -- --  BILITOT -- -- --  PROT -- -- --  ALBUMIN 3.1* 2.8* 3.3*   CBC:  Lab 09/22/12 0430 09/21/12 0430 09/18/12 2040 09/18/12 1730 09/18/12 1654 09/18/12 0545 09/17/12 1327  WBC 6.4 7.4 8.7 -- -- 5.6 5.9  NEUTROABS -- -- -- -- -- 3.2 3.9  HGB 9.4* 8.8* 7.6* 6.9* 6.8* -- --  HCT 27.3* 25.9* 22.3* 19.9* 20.6* -- --  MCV 83.7 82.7 83.5 -- -- 85.4 85.4  PLT 164 178 169 -- -- 162 198   Cardiac Enzymes:  Lab 09/19/12 0948 09/19/12 0403 09/18/12 2326 09/18/12 2039 09/17/12 1610  CKTOTAL -- -- -- -- --  CKMB -- -- -- -- --  CKMBINDEX -- -- -- -- --  TROPONINI <0.30 <0.30 <0.30 <0.30 <0.30   BNP (last 3 results)  Basename 05/05/12 2302  PROBNP 32672.0*    Recent Results (from the past 240 hour(s))  MRSA PCR SCREENING     Status: Abnormal    Collection Time   09/18/12  6:14 PM      Component Value Range Status Comment   MRSA by PCR POSITIVE (*) NEGATIVE Final      Studies:  Recent x-ray studies have been reviewed in detail by the Attending Physician  Scheduled Meds:     . amLODipine  5 mg Oral Daily  . aspirin  81 mg Oral Daily  . calcium acetate  667 mg Oral TID WC  . carvedilol  12.5 mg Oral BID WC  . Chlorhexidine Gluconate Cloth  6 each Topical Q0600  . darbepoetin (ARANESP) injection - NON-DIALYSIS  200 mcg Subcutaneous Q Sun-1800  . ferric gluconate (FERRLECIT/NULECIT) IV  125 mg Intravenous Daily  . finasteride  5 mg Oral Daily  . loratadine  10 mg Oral Daily  . mupirocin ointment  1 application Nasal BID  . paricalcitol  2 mcg Intravenous 3 times weekly  . sodium bicarbonate  1,300 mg Oral TID  . sodium chloride  3 mL Intravenous Q12H  . sodium chloride  3 mL Intravenous Q12H  . Tamsulosin HCl  0.4 mg Oral BID      Eschol Auxier  [facility] Office  [phone] Pager [phone]  On-Call/Text Page:      [email or web address]      password [email or web address]  If 7PM-7AM, please contact night-coverage [email or web address] Password [email or web address] 09/23/2012, 8:36 AM   LOS: 6 days

## 2012-09-23 NOTE — Evaluation (Signed)
Physical Therapy Evaluation Patient Details Name: Trevor [name] MRN: [id number] DOB: 09-27-1946 Today's Date: 09/23/2012 Time: [id number] PT Time Calculation (min): 16 min  PT Assessment / Plan / Recommendation Clinical Impression  Pt admitted with cardiomyopathy and HTN with HD fistula placed 10/22. Pt with varied reports of function at home and demonstrates decreased problem solving although able to accurately state how to call 911. Concerned with pt ability to care for himself at home and recommend HHPT for safety eval as well as HHRN for medication management. Will follow acutely to maximize safety, mobility and function to increase independence prior to discharge. Pt in chair with breakfast end of session and Berg deferred.     PT Assessment  Patient needs continued PT services    Follow Up Recommendations  Home health PT    Does the patient have the potential to tolerate intense rehabilitation      Barriers to Discharge Decreased caregiver support      Equipment Recommendations  Rolling walker with 5" wheels    Recommendations for Other Services     Frequency Min 3X/week    Precautions / Restrictions Precautions Precautions: Fall Restrictions Weight Bearing Restrictions: No   Pertinent Vitals/Pain No pain      Mobility  Bed Mobility Bed Mobility: Supine to Sit Supine to Sit: 5: Supervision;HOB flat Details for Bed Mobility Assistance: cueing for safety as pt slightly impulsive and sitting up prior to bed alarm being turned off Transfers Transfers: Sit to Stand;Stand to Sit Sit to Stand: 5: Supervision;From bed Stand to Sit: 5: Supervision;To chair/3-in-1 Details for Transfer Assistance: cueing for safety as pt quick to stand and needed cueing for reaching for chair to sit rather than holding RW Ambulation/Gait Ambulation/Gait Assistance: 5: Supervision Ambulation Distance (Feet): 120 Feet Assistive device: Rolling walker Ambulation/Gait Assistance Details:  cueing for RW use and safety, pt denied further distance due to fatigue Gait Pattern: Within Functional Limits Gait velocity: decreased Stairs: No    Shoulder Instructions     Exercises     PT Diagnosis: Difficulty walking  PT Problem List: Decreased activity tolerance;Decreased safety awareness;Decreased knowledge of use of DME PT Treatment Interventions: Gait training;DME instruction;Functional mobility training;Therapeutic activities;Patient/family education;Therapeutic exercise   PT Goals Acute Rehab PT Goals Time For Goal Achievement: 09/30/12 Potential to Achieve Goals: Good Pt will go Sit to Stand: with modified independence PT Goal: Sit to Stand - Progress: Goal set today Pt will go Stand to Sit: with modified independence PT Goal: Stand to Sit - Progress: Goal set today Pt will Ambulate: >150 feet;with modified independence;with least restrictive assistive device PT Goal: Ambulate - Progress: Goal set today Additional Goals Additional Goal #1: Pt will complete Berg assessment to further evaluate balance PT Goal: Additional Goal #1 - Progress: Goal set today  Visit Information  Last PT Received On: 09/23/12 Assistance Needed: +1    Subjective Data  Subjective: I can't see that well, only shapes Patient Stated Goal: return home   Prior [place] Lives With: Alone Type of Home: Apartment Home Access: Level entry Home Layout: One level Bathroom Shower/Tub: Chiropodist: Standard Home Adaptive Equipment: Shower chair with back;Wheelchair - manual Prior Function Level of Independence: Independent Able to Take Stairs?: No Driving: No Vocation: Unemployed Comments: Pt states he fell out of tub recently, does his own ADLs and housework and has a hard time managing medications but was taking medicines prior to admission. Pt states he walks to the grocery store pushing  a WC but for long distances rides in [facility]. Pt does not use AD in the  home Communication Communication: No difficulties    Cognition  Overall Cognitive Status: Impaired Area of Impairment: Memory;Safety/judgement;Problem solving Arousal/Alertness: Awake/alert Orientation Level: Appears intact for tasks assessed Behavior During Session: [facility] for tasks performed Memory Deficits: Pt with varied responses of when he uses AD and how far he can ambulate at home Safety/Judgement: Decreased safety judgement for tasks assessed Problem Solving: Pt with generally slow responses to questions at times and states he can only see shapes with visual deficits yet could pick out room number accurately but could not read clock on the wall    Extremity/Trunk Assessment Right Lower Extremity Assessment RLE ROM/Strength/Tone: [facility] for tasks assessed Left Lower Extremity Assessment LLE ROM/Strength/Tone: [facility] for tasks assessed Trunk Assessment Trunk Assessment: Normal   Balance    End of Session PT - End of Session Equipment Utilized During Treatment: Gait belt Activity Tolerance: Patient tolerated treatment well Patient left: in chair;with call bell/phone within reach;with chair alarm set Nurse Communication: Mobility status  GP     Lanetta Inch Beth 09/23/2012, [place], [place]

## 2012-09-23 NOTE — Evaluation (Signed)
Occupational Therapy Evaluation Patient Details Name: Trevor [name] MRN: [id number] DOB: 05-15-1946 Today's Date: 09/23/2012 Time: [id number] OT Time Calculation (min): 15 min  OT Assessment / Plan / Recommendation Clinical Impression  Pt admitted with cardiomyopathy and HTN with HD fistula placed 10/22. Will benefit from acute OT services to address below problem list.  Recommend HHOT as pt could greatly benefit from home safety eval.    OT Assessment  Patient needs continued OT Services    Follow Up Recommendations  Home health OT    Barriers to Discharge Decreased caregiver support no family/friends available per pt report  Equipment Recommendations  Rolling walker with 5" wheels    Recommendations for Other Services    Frequency  Min 2X/week    Precautions / Restrictions Precautions Precautions: Fall Restrictions Weight Bearing Restrictions: No   Pertinent Vitals/Pain See vitals    ADL  Eating/Feeding: Performed;Independent Where Assessed - Eating/Feeding: Chair Upper Body Bathing: Simulated;Set up Where Assessed - Upper Body Bathing: Unsupported sitting Lower Body Bathing: Simulated;Supervision/safety Where Assessed - Lower Body Bathing: Unsupported sit to stand Upper Body Dressing: Performed;Set up Where Assessed - Upper Body Dressing: Unsupported sitting Lower Body Dressing: Simulated;Supervision/safety Where Assessed - Lower Body Dressing: Unsupported sit to stand Toilet Transfer: Simulated;Supervision/safety Toilet Transfer Method: Sit to Loss adjuster, chartered: Other (comment) (chair) Equipment Used: Gait belt;Rolling walker Transfers/Ambulation Related to ADLs: supervision with RW ADL Comments: Pt at supervision level during session.  Pt declined grooming at standing at sink or participating in further balance assessment due to desire to eat breakfast.  Pt with multiple reports that he cannot see very well (reports vision deficits beginning in  Novermber '13).  Pt able to locate his room without assistance by using room numbers (able to see room number) but unable to read clock on wall ~8 ft away.  Pt reports he is having special glassess made for him.     OT Diagnosis: Generalized weakness;Cognitive deficits;Disturbance of vision  OT Problem List: Decreased activity tolerance;Impaired vision/perception;Decreased knowledge of use of DME or AE;Decreased safety awareness;Decreased cognition OT Treatment Interventions: Self-care/ADL training;DME and/or AE instruction;Therapeutic activities;Cognitive remediation/compensation;Patient/family education;Balance training;Visual/perceptual remediation/compensation   OT Goals Acute Rehab OT Goals OT Goal Formulation: With patient Time For Goal Achievement: 09/30/12 Potential to Achieve Goals: Good ADL Goals Pt Will Perform Grooming: with modified independence;Standing at sink (3 tasks) ADL Goal: Grooming - Progress: Goal set today Pt Will Transfer to Toilet: with modified independence;Ambulation;with DME;Regular height toilet ADL Goal: Toilet Transfer - Progress: Goal set today Pt Will Perform Tub/Shower Transfer: Tub transfer;with modified independence;Ambulation;with DME;Shower seat with back ADL Goal: Clinical cytogeneticist - Progress: Goal set today Miscellaneous OT Goals Miscellaneous OT Goal #1: Pt will be able to retrieve ADL items throughout room at mod I level. OT Goal: Miscellaneous Goal #1 - Progress: Goal set today Miscellaneous OT Goal #2: Pt will independently use visual compensatory strategies to safey navigate through room and unit. OT Goal: Miscellaneous Goal #2 - Progress: Goal set today  Visit Information  Last OT Received On: 09/23/12 Assistance Needed: +1 PT/OT Co-Evaluation/Treatment: Yes    Subjective Data      Prior Functioning     Home Living Lives With: Alone Type of Home: Apartment Home Access: Level entry Home Layout: One level Bathroom Shower/Tub:  Chiropodist: Standard Home Adaptive Equipment: Shower chair with back;Wheelchair - manual Prior Function Level of Independence: Independent Able to Take Stairs?: No Driving: No Vocation: Unemployed Comments: Pt states he fell out of  tub recently, does his own ADLs and housework and has a hard time managing medications but was taking medicines prior to admission. Pt states he walks to the grocery store pushing a WC but for long distances rides in [facility]. Pt does not use AD in the home Communication Communication: No difficulties Dominant Hand: Right         Vision/Perception Vision - Assessment Vision Assessment: Vision impaired - to be further tested in functional context Additional Comments: Pt reports he is only able to make out shapes.  Pt frequently closes eyes throughout session. Reports his vision has been this away since he experienced a stroke in November 2013. Will continue to assess.   Cognition  Overall Cognitive Status: Impaired Area of Impairment: Memory;Safety/judgement;Problem solving Arousal/Alertness: Awake/alert Orientation Level: Appears intact for tasks assessed Behavior During Session: [facility] for tasks performed Memory Deficits: Pt with varied responses of when he uses AD and how far he can ambulate at home Safety/Judgement: Decreased safety judgement for tasks assessed Problem Solving: Pt with generally slow responses to questions at times and states he can only see shapes with visual deficits yet could pick out room number accurately but could not read clock on the wall Cognition - Other Comments: Feel that this is pt's baseline.    Extremity/Trunk Assessment Right Upper Extremity Assessment RUE ROM/Strength/Tone: WFL for tasks assessed Left Upper Extremity Assessment LUE ROM/Strength/Tone: WFL for tasks assessed Right Lower Extremity Assessment RLE ROM/Strength/Tone: [facility] for tasks assessed Left Lower Extremity Assessment LLE  ROM/Strength/Tone: WFL for tasks assessed Trunk Assessment Trunk Assessment: Normal     Mobility Bed Mobility Bed Mobility: Supine to Sit Supine to Sit: 5: Supervision;HOB flat Details for Bed Mobility Assistance: cueing for safety as pt slightly impulsive and sitting up prior to bed alarm being turned off Transfers Transfers: Sit to Stand;Stand to Sit Sit to Stand: 5: Supervision;From bed Stand to Sit: 5: Supervision;To chair/3-in-1 Details for Transfer Assistance: cueing for safety as pt quick to stand and needed cueing for reaching for chair to sit rather than holding RW     Shoulder Instructions     Exercise     Balance     End of Session OT - End of Session Equipment Utilized During Treatment: Gait belt Activity Tolerance: Patient limited by fatigue Patient left: in chair;with call bell/phone within reach;with chair alarm set Nurse Communication: Mobility status  GO    09/23/2012 Darrol Jump OTR/L Pager [phone] Office [phone]  Darrol Jump 09/23/2012, 9:51 AM

## 2012-09-24 ENCOUNTER — Encounter (HOSPITAL_COMMUNITY): Payer: Self-pay | Admitting: Vascular Surgery

## 2012-09-24 ENCOUNTER — Inpatient Hospital Stay (HOSPITAL_COMMUNITY): Payer: Medicare HMO

## 2012-09-24 DIAGNOSIS — I10 Essential (primary) hypertension: Secondary | ICD-10-CM

## 2012-09-24 LAB — RENAL FUNCTION PANEL
CO2: 29 mEq/L (ref 19–32)
Chloride: 101 mEq/L (ref 96–112)
Creatinine, Ser: 3.24 mg/dL — ABNORMAL HIGH (ref 0.50–1.35)
GFR calc Af Amer: 21 mL/min — ABNORMAL LOW (ref >90)
GFR calc non Af Amer: 18 mL/min — ABNORMAL LOW (ref >90)
Glucose, Bld: 117 mg/dL — ABNORMAL HIGH (ref 70–99)

## 2012-09-24 LAB — CBC
HCT: 28.1 % — ABNORMAL LOW (ref 39.0–52.0)
Hemoglobin: 9.2 g/dL — ABNORMAL LOW (ref 13.0–17.0)
MCH: 29.2 pg (ref 26.0–34.0)
RBC: 3.15 MIL/uL — ABNORMAL LOW (ref 4.22–5.81)

## 2012-09-24 MED ORDER — HEPARIN SODIUM (PORCINE) 1000 UNIT/ML DIALYSIS
20.0000 [IU]/kg | INTRAMUSCULAR | Status: DC | PRN
  Filled 2012-09-24: qty 2

## 2012-09-24 MED ORDER — PARICALCITOL 5 MCG/ML IV SOLN
INTRAVENOUS | Status: AC
  Filled 2012-09-24: qty 1

## 2012-09-24 MED ORDER — LISINOPRIL 10 MG PO TABS
10.0000 mg | ORAL_TABLET | Freq: Every day | ORAL | Status: DC
  Administered 2012-09-24 – 2012-09-26 (×3): 10 mg via ORAL
  Filled 2012-09-24 (×4): qty 1

## 2012-09-24 NOTE — Progress Notes (Signed)
[facility] Progress Note    JAQUELL BURBA [id number] DOB: 1946/03/01 DOA: 09/17/2012 [facility]   Brief narrative: Trevor [name] is a 66 yo with hx of nonischemic CM, CKD IV, HTN admitted with elevated BP, anemia and worsening renal fxn. He developed hypotension after restarting his meds. The suspecion was that he was not actually taking his medications at home. He went on to develop ARF and now is deemed ESRd and HD dependent   Assessment/Plan:  HTN / Hypotension BP was very labile during admission -  Upon presentation BP was elevated then it dropped - then went up again . Better control after initiation of HD     Anemia Due to chronic renal disease - Fe and aranesp as per Nephrology  ESRD Has begun HD - Nephrology is following   Tobacco abuse Counseled on need to abstain   Acute on chronic Systolic CHF Volume control via HD .  Hepatitis C Will check LFTs and viral load to begin assessment - given compliance issues, he may not be a candidate for tx even if it is indicated  Code Status: Full Disposition Plan: snf  Consultants: Nephrology Cardiology Vasc Surgery  Procedures: 10/22 - Ultrasound guided placement of right IJ Diatek catheter & Left basilic vein transposition  10/21 - Echo shows LV EF of [id number], diastolic dysfunction  Antibiotics: none  DVT prophylaxis: SCDs  HPI/Subjective: No complains    Objective: Blood pressure 171/76, pulse 82, temperature 99.4 F (37.4 C), temperature source Oral, resp. rate 17, height 5\' 10"  (1.778 m), weight 66.996 kg (147 lb 11.2 oz), SpO2 100.00%.  Intake/Output Summary (Last 24 hours) at 09/24/12 0851 Last data filed at 09/23/12 1819  Gross per 24 hour  Intake    470 ml  Output    250 ml  Net    220 ml    Patient Vitals for the past 24 hrs:  BP Temp Temp src Pulse Resp SpO2 Weight  09/24/12 0611 171/76 mmHg 99.4 F (37.4 C) Oral 82  17  100 % -  09/23/12 2111 168/63 mmHg 99.1 F (37.3 C) Oral 81   18  99 % 66.996 kg (147 lb 11.2 oz)  09/23/12 1632 141/58 mmHg 99 F (37.2 C) - 72  17  98 % -  09/23/12 1307 142/70 mmHg 98 F (36.7 C) Oral 80  16  98 % -  09/23/12 1004 155/65 mmHg 97.4 F (36.3 C) Oral 78  18  98 % -     Exam: General: No acute respiratory distress Lungs: Clear to auscultation bilaterally without wheezes or crackles Cardiovascular: Regular rate and rhythm without murmur gallop or rub Abdomen: Nontender, nondistended, soft, bowel sounds positive, no rebound, no ascites, no appreciable mass Extremities: No significant cyanosis, clubbing, or edema bilateral lower extremities  Data Reviewed: Basic Metabolic Panel:  Lab [id number] 0430 09/21/12 0430 09/20/12 0451 09/19/12 0505 09/18/12 2040 09/17/12 1753  NA 139 144 140 139 135 --  K 3.9 5.0 4.9 4.6 5.0 --  CL 106 113* 114* 112 113* --  CO2 21 15* 15* 12* 9* --  GLUCOSE 114* 83 87 145* 309* --  BUN 52* 79* 78* 80* 73* --  CREATININE 5.07* 7.27* 7.45* 6.87* 6.79* --  CALCIUM 8.3* 8.7 7.9* 7.7* 7.2* --  MG -- -- -- -- -- --  PHOS -- 5.2* 5.5* -- -- 5.8*   Liver Function Tests:  Lab 09/21/12 0430 09/20/12 0451 09/17/12 1753  AST -- -- --  ALT -- -- --  ALKPHOS -- -- --  BILITOT -- -- --  PROT -- -- --  ALBUMIN 3.1* 2.8* 3.3*   CBC:  Lab 09/22/12 0430 09/21/12 0430 09/18/12 2040 09/18/12 1730 09/18/12 1654 09/18/12 0545 09/17/12 1327  WBC 6.4 7.4 8.7 -- -- 5.6 5.9  NEUTROABS -- -- -- -- -- 3.2 3.9  HGB 9.4* 8.8* 7.6* 6.9* 6.8* -- --  HCT 27.3* 25.9* 22.3* 19.9* 20.6* -- --  MCV 83.7 82.7 83.5 -- -- 85.4 85.4  PLT 164 178 169 -- -- 162 198   Cardiac Enzymes:  Lab 09/19/12 0948 09/19/12 0403 09/18/12 2326 09/18/12 2039 09/17/12 1610  CKTOTAL -- -- -- -- --  CKMB -- -- -- -- --  CKMBINDEX -- -- -- -- --  TROPONINI <0.30 <0.30 <0.30 <0.30 <0.30   BNP (last 3 results)  Basename 05/05/12 2302  PROBNP 32672.0*    Recent Results (from the past 240 hour(s))  MRSA PCR SCREENING     Status: Abnormal     Collection Time   09/18/12  6:14 PM      Component Value Range Status Comment   MRSA by PCR POSITIVE (*) NEGATIVE Final      Studies:  Recent x-ray studies have been reviewed in detail by the Attending Physician  Scheduled Meds:     . amLODipine  5 mg Oral Daily  . aspirin  81 mg Oral Daily  . calcium acetate  667 mg Oral TID WC  . carvedilol  12.5 mg Oral BID WC  . Chlorhexidine Gluconate Cloth  6 each Topical Q0600  . darbepoetin (ARANESP) injection - NON-DIALYSIS  200 mcg Subcutaneous Q Sun-1800  . ferric gluconate (FERRLECIT/NULECIT) IV  125 mg Intravenous Daily  . finasteride  5 mg Oral Daily  . lisinopril  10 mg Oral Daily  . loratadine  10 mg Oral Daily  . mupirocin ointment  1 application Nasal BID  . paricalcitol  2 mcg Intravenous 3 times weekly  . sodium bicarbonate  1,300 mg Oral TID  . sodium chloride  3 mL Intravenous Q12H  . sodium chloride  3 mL Intravenous Q12H  . Tamsulosin HCl  0.4 mg Oral BID      Charlisha Market  [facility] Office  [phone] Pager [phone]  On-Call/Text Page:      [email or web address]      password [email or web address]  If 7PM-7AM, please contact night-coverage [email or web address] Password [email or web address] 09/24/2012, 8:51 AM   LOS: 7 days

## 2012-09-24 NOTE — Progress Notes (Signed)
[name]  Subjective:  Awake, alert, sitting in chair.  Lives by himself on [address].  He says he's not sure if he can take care of himself when he leaves hospital. He has been approved for [facility]--MWF   Objective: Vital signs in last 24 hours: Blood pressure 150/62, pulse 73, temperature 97.8 F (36.6 C), temperature source Oral, resp. rate 18, height 5\' 10"  (1.778 m), weight 66.996 kg (147 lb 11.2 oz), SpO2 100.00%.    PHYSICAL EXAM General--as above Chest--clear, tunneled cath R IJ Heart--no rub Abd--nontender Extr--no edema, new AVF L upper arm  Lab Results:   Lab 09/22/12 0430 09/21/12 0430 09/20/12 0451 09/17/12 1753  NA 139 144 140 --  K 3.9 5.0 4.9 --  CL 106 113* 114* --  CO2 21 15* 15* --  BUN 52* 79* 78* --  CREATININE 5.07* 7.27* 7.45* --  ALB -- -- -- --  GLUCOSE 114* -- -- --  CALCIUM 8.3* 8.7 7.9* --  PHOS -- 5.2* 5.5* 5.8*     Basename 09/22/12 0430  WBC 6.4  HGB 9.4*  HCT 27.3*  PLT 164    I have reviewed the patient's current medications.   Assessment/Plan: 1. ESRD-- HD again today, plan outpt HD (approval given from [place] for outpt dialysis--according to Sherlon Handing, RN--Case Mgr)--he lives near [place].,  [facility] MWF 2. HTN--BP better--back on some of home meds--BP likely to decrease with dialysis--plan to decrease weight to 60 kg. Today in HD. If BP still high ACE inhib is good choice. Lisinopril started 3. Anemia with iron deficiency--on aranesp and iv Fe. 4. Metabolic acidosis--bicarb now 21--will d/c po bicarb  5. H/O CVA--per primary  6. H/O AAA-- (noted by [facility]) < 3 cm  7 Hyperphosphatemia--phosphorus now in goal range (< 5.5) on Calcium acetate 667 TID wit meals. PTH 330--now on zemplar  8. Cardiomyopathy with EF 40% and prev abnormal stress test ([facility])--echo : Normal LV size, EF 30-35%, mod. Diastolic dysfunction, no valvular dysfct.   Need Case Mgr to see about whether he'll need  help at home + ride to dialysis (or [name])    LOS: 7 days   Matheau Orona F 09/24/2012,1:14 PM   .labalb

## 2012-09-24 NOTE — Progress Notes (Signed)
Clinical Social Work-CSW provided pt dtr with bed offers and is in process of "fastracking" [employer] auth and pt will be stable for d/c Monday-CSW will follow- Gerre Scull, [phone]

## 2012-09-24 NOTE — Progress Notes (Signed)
Hemodialysis-Awaiting [name] placement for [place] office. Pt will be going to [facility].

## 2012-09-24 NOTE — Progress Notes (Signed)
Physical Therapy Treatment Patient Details Name: Trevor [name] MRN: [id number] DOB: 09-30-1946 Today's Date: 09/24/2012 Time: [id number] PT Time Calculation (min): 31 min  PT Assessment / Plan / Recommendation Comments on Treatment Session  Pt. progressing mobility gradually , though needed about 5 standing rest breaks due to fatigue.    Follow Up Recommendations  Home health PT     Does the patient have the potential to tolerate intense rehabilitation     Barriers to Discharge        Equipment Recommendations  Rolling walker with 5" wheels    Recommendations for Other Services    Frequency Min 3X/week   Plan Discharge plan remains appropriate    Precautions / Restrictions Precautions Precautions: Fall Restrictions Weight Bearing Restrictions: No   Pertinent Vitals/Pain Denies pain    Mobility  Bed Mobility Bed Mobility: Not assessed Transfers Transfers: Sit to Stand;Stand to Sit Sit to Stand: 5: Supervision;From bed Stand to Sit: 5: Supervision;To chair/3-in-1 Details for Transfer Assistance: safety cueing needed Ambulation/Gait Ambulation/Gait Assistance: 4: Min guard Ambulation Distance (Feet): 175 Feet Assistive device: Rolling walker Ambulation/Gait Assistance Details: Pt. 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   Cognition  Overall Cognitive Status: Impaired Area of Impairment: Memory;Safety/judgement;Problem  solving Arousal/Alertness: Awake/alert Orientation Level: Appears intact for tasks assessed Behavior During Session: [facility] for tasks performed Memory Deficits: Pt with varied responses of when he uses AD and how far he can ambulate at home Safety/Judgement: Decreased safety judgement for tasks assessed    Balance     End of Session PT - End of Session Equipment Utilized During Treatment: Gait belt Activity Tolerance: Patient tolerated treatment well Patient left: in chair;with call bell/phone within reach;with chair alarm set Nurse Communication: Mobility status   GP     [name] 09/24/2012, 10:09 AM [place] [phone]

## 2012-09-24 NOTE — Plan of Care (Signed)
Problem: Food- and Nutrition-Related Knowledge Deficit (NB-1.1) Goal: Nutrition education Formal process to instruct or train a patient/client in a skill or to impart knowledge to help patients/clients voluntarily manage or modify food choices and eating behavior to maintain or improve health.  Outcome: Completed/Met Date Met:  09/24/12  Nutrition Education Note  RD verbally consulted for Renal Education. Provided Choose-A-Meal Booklet and Chronic Kidney Disease Pyramid to patient. Reviewed food groups and provided written recommended serving sizes specifically determined for patient's current nutritional status.   Explained why diet restrictions are needed and provided lists of foods to limit/avoid that are high potassium, sodium, and phosphorus. Provided specific recommendations on safer alternatives of these foods. Strongly encouraged compliance of this diet.   Discussed importance of protein intake at each meal and snack. Provided examples of how to maximize protein intake throughout the day. Discussed need for fluid restriction with dialysis, importance of minimizing weight gain between HD treatments, and renal-friendly beverage options.  Encouraged pt to discuss specific diet questions/concerns with RD at HD outpatient facility.   Expect fair compliance.  Body mass index is 21.19 kg/(m^2). Pt meets criteria for Normal Weight based on current BMI.  Current diet order is Renal 60-70, patient is consuming approximately 100% of meals at this time. Labs and medications reviewed. No further nutrition interventions warranted at this time. RD contact information provided. If additional nutrition issues arise, please re-consult RD.  Inda Coke MS, RD, LDN Pager: [phone] After-hours pager: [phone]

## 2012-09-24 NOTE — Progress Notes (Signed)
PROGRESS NOTE  Primary Cardiologist:  Kirk Ruths, MD  Subjective:   Trevor [name] is a 66 yo with hx of nonischemic CM, CKD IV, HTN admitted with elevated BP, anemia and worsening renal fxn.  He developed hypotension after restarting his meds.  He was probably  not actually taking his medications at home.    Echo shows LV EF of 99991111, diastolic dysfunction,  He had a dialysis graft placed .  Objective:    Vital Signs:   Temp:  [97.4 F (36.3 C)-99.4 F (37.4 C)] 99.4 F (37.4 C) (10/25 0611) Pulse Rate:  [72-82] 82  (10/25 0611) Resp:  [16-18] 17  (10/25 0611) BP: (141-171)/(58-76) 171/76 mmHg (10/25 0611) SpO2:  [98 %-100 %] 100 % (10/25 0611) Weight:  [147 lb 11.2 oz (66.996 kg)] 147 lb 11.2 oz (66.996 kg) (10/24 2111)  Last BM Date: 09/22/12   24-hour weight change: Weight change: 4 lb 9.9 oz (2.096 kg)  Weight trends: Filed Weights   09/22/12 1148 09/22/12 1536 09/23/12 2111  Weight: 143 lb 1.3 oz (64.9 kg) 138 lb 14.2 oz (63 kg) 147 lb 11.2 oz (66.996 kg)    Intake/Output:  10/24 0701 - 10/25 0700 In: 470 [P.O.:360; IV Piggyback:110] Out: 250 [Urine:250]     Physical Exam: BP 171/76  Pulse 82  Temp 99.4 F (37.4 C) (Oral)  Resp 17  Ht 5\' 10"  (1.778 m)  Wt 147 lb 11.2 oz (66.996 kg)  BMI 21.19 kg/m2  SpO2 100%  General: Vital signs reviewed and noted. Chronically ill appearing  Head: Normocephalic, atraumatic.  Eyes: conjunctivae/corneas clear.  EOM's intact.   Throat: normal  Neck: Supple. Normal carotids. No JVD  Lungs:  Clear   Heart: Regular rate,  With normal  S1 S2. No murmurs,   Abdomen:  Soft, non-tender, non-distended with normoactive bowel sounds. No hepatomegaly. No rebound/guarding. No abdominal masses.  Extremities: Distal pedal pulses are 2+ .  No edema.    Neurologic: A&O X3, CN II - XII are grossly intact. Motor strength is 5/5 in the all 4 extremities.  Psych: Responds to questions appropriately with normal affect.     Labs: BMET:  Basename 09/22/12 0430  NA 139  K 3.9  CL 106  CO2 21  GLUCOSE 114*  BUN 52*  CREATININE 5.07*  CALCIUM 8.3*  MG --  PHOS --    Liver function tests: No results found for this basename: AST:2,ALT:2,ALKPHOS:2,BILITOT:2,PROT:2,ALBUMIN:2 in the last 72 hours No results found for this basename: LIPASE:2,AMYLASE:2 in the last 72 hours  CBC:  Basename 09/22/12 0430  WBC 6.4  NEUTROABS --  HGB 9.4*  HCT 27.3*  MCV 83.7  PLT 164    Cardiac Enzymes: No results found for this basename: CKTOTAL:4,CKMB:4,TROPONINI:4 in the last 72 hours  Coagulation Studies: No results found for this basename: LABPROT:5,INR:5 in the last 72 hours No results found for this basename: VITAMINB12,FOLATE,FERRITIN,TIBC,IRON,RETICCTPCT in the last 72 hours   Tele: 09/20/12- NSR  at 91  Medications:    Infusions:    Scheduled Medications:    . amLODipine  5 mg Oral Daily  . aspirin  81 mg Oral Daily  . calcium acetate  667 mg Oral TID WC  . carvedilol  12.5 mg Oral BID WC  . Chlorhexidine Gluconate Cloth  6 each Topical Q0600  . darbepoetin (ARANESP) injection - NON-DIALYSIS  200 mcg Subcutaneous Q Sun-1800  . ferric gluconate (FERRLECIT/NULECIT) IV  125 mg Intravenous Daily  . finasteride  5 mg  Oral Daily  . loratadine  10 mg Oral Daily  . mupirocin ointment  1 application Nasal BID  . paricalcitol  2 mcg Intravenous 3 times weekly  . sodium bicarbonate  1,300 mg Oral TID  . sodium chloride  3 mL Intravenous Q12H  . sodium chloride  3 mL Intravenous Q12H  . Tamsulosin HCl  0.4 mg Oral BID    Assessment/ Plan:    Hypertension: Will add Lisinopril 10 - to be given after dialysis per Dr. Hassell Done.  Systolic CHF, chronic (123XX123) Echo shows EF of 30-35%.   He has been noncompliant with his medications.   Coreg. Starting Lisinopril  ESRD (end stage renal disease) (05/05/2012)  plan per int. Med.   Disposition:  Length of Stay: 7  Thayer Headings, Brooke Bonito., MD,  [facility] 09/24/2012, 8:30 AM Office [phone] Pager [phone]

## 2012-09-24 NOTE — Progress Notes (Signed)
Clinical Social Work Department BRIEF PSYCHOSOCIAL ASSESSMENT 09/24/2012  Patient:  KURT, HOUGHLAND     Account Number:  [id number]     Admit date:  09/17/2012  Clinical Social Worker:  Glenice Laine  Date/Time:  09/24/2012 10:45 AM  Referred by:  Physician  Date Referred:  09/24/2012 Referred for  [name] Placement   Other Referral:   Interview type:  Patient Other interview type:    PSYCHOSOCIAL DATA Living Status:  ALONE Admitted from facility:   Level of care:   Primary support name:  Ronalee Belts Primary support relationship to patient:  SIBLING Degree of support available:   Stron-  Pt dtr [place] also involved in plan of care    CURRENT CONCERNS Current Concerns  Post-Acute Placement   Other Concerns:    SOCIAL WORK ASSESSMENT / PLAN CSW met with pt at bedside to discuss disposition and future needs with regards to dialysis-  CSW reviewed treatment team recommendations with regards to [name] and pt relayed that he has been through short term rehab process before after a stroke-  CSW will initiate FL2 and bed search and will provide offers for pt when available   Assessment/plan status:  Psychosocial Support/Ongoing Assessment of Needs Other assessment/ plan:   Information/referral to community resources:   ST [name]    PATIENT'S/FAMILY'S RESPONSE TO PLAN OF CARE: Pt very understanding and agreeable with plan of care-  Pt able to express an understanding of increased needs with regards to rehab and dialysis-Pt able to express an understanding of social work role in placement and psychosocial support-  Pt named brother and dtr as support with regards to choosing facility and coordination of care-CSW will contact family with bed offers and updates. Margreta Journey [phone]

## 2012-09-25 MED ORDER — SENNOSIDES-DOCUSATE SODIUM 8.6-50 MG PO TABS
2.0000 | ORAL_TABLET | Freq: Every evening | ORAL | Status: DC | PRN
  Administered 2012-09-25: 2 via ORAL
  Filled 2012-09-25 (×2): qty 2

## 2012-09-25 NOTE — Progress Notes (Signed)
[name]  Subjective:  No complaints, says he feels a lot better since starting dialysis.  Objective: Vital signs in last 24 hours: Blood pressure 142/62, pulse 72, temperature 98.6 F (37 C), temperature source Oral, resp. rate 16, height 5\' 10"  (1.778 m), weight 64.275 kg (141 lb 11.2 oz), SpO2 99.00%.    PHYSICAL EXAM General--as above Chest--clear, tunneled cath R IJ Heart--no rub Abd--nontender Extr--no edema, new AVF L upper arm  Lab Results:   Lab 09/24/12 2105 09/22/12 0430 09/21/12 0430 09/20/12 0451  NA 138 139 144 --  K 4.0 3.9 5.0 --  CL 101 106 113* --  CO2 29 21 15* --  BUN 20 52* 79* --  CREATININE 3.24* 5.07* 7.27* --  ALB -- -- -- --  GLUCOSE 117* -- -- --  CALCIUM 8.3* 8.3* 8.7 --  PHOS 2.0* -- 5.2* 5.5*     Basename 09/24/12 2105  WBC 6.0  HGB 9.2*  HCT 28.1*  PLT 112*    I have reviewed the patient's current medications.   Assessment/Plan:  1. ESRD-- s/p HD x 3, last one was yest 10/25. Next inpt HD will be Monday. Reportedly approval given from [place] for outpt dialysis (according to Sherlon Handing, RN--Case Mgr) at [facility] MWF 2. HTN- on norvasc, coreg and lisinopril 3. Anemia with iron deficiency--on aranesp and iv Fe 4. H/O CVA--per primary  5. H/O AAA-- (noted by [facility]VS) < 3 cm  6. Hyperphosphatemia--phosphorus now in goal range (< 5.5) on Calcium acetate 667 TID wit meals. PTH 330--now on zemplar  7. Cardiomyopathy with EF 40% and prev abnormal stress test ([place])--echo : Normal LV size, EF 30-35%, mod. Diastolic dysfunction, no valvular dysfct.   Need Case Mgr to see about whether he'll need help at home + ride to dialysis (or [name])  Kelly Splinter  MD [facility] [phone] pgr    [phone] cell 09/25/2012, 3:09 PM

## 2012-09-25 NOTE — Progress Notes (Signed)
Patient Name: DECIMUS GLUCKMAN      SUBJECTIVE:Mr. Sherratt is a 66 yo with hx of nonischemic CM, CKD IV, HTN admitted with elevated BP, anemia and worsening renal fxn. He developed hypotension after restarting his meds. He was probably not actually taking his medications at home.  ECHO EF 35% on Coreg and lisinopril added yesterday  Tolerated dialysis pretty well  No sob or cp Past Medical History  Diagnosis Date  . HTN (hypertension)   . Anxiety   . Cardiomyopathy   . Renal insufficiency   . Myocardial infarction   . CVA (cerebral infarction)   . Chest pain   . Shortness of breath   . Stroke   . Headache   . Arthritis     PHYSICAL EXAM Filed Vitals:   09/24/12 1829 09/24/12 2200 09/25/12 0611 09/25/12 0949  BP: 145/72 163/62 141/64   Pulse:  72 65 61  Temp:  99.2 F (37.3 C) 98.5 F (36.9 C) 98 F (36.7 C)  TempSrc:  Oral Oral Oral  Resp:  16 16 16   Height:      Weight:  141 lb 11.2 oz (64.275 kg)    SpO2:  100% 100% 98%   Well developed and nourished in no acute distress HENT normal Neck supple   Clear Regular rate and rhythm, no murmurs or gallops Abd-soft with active BS without hepatomegaly No Clubbing cyanosis edema Skin-warm and dry A & Oriented  Grossly normal sensory and motor function       Intake/Output Summary (Last 24 hours) at 09/25/12 1006 Last data filed at 09/24/12 1744  Gross per 24 hour  Intake      0 ml  Output   1550 ml  Net  -1550 ml    LABS: Basic Metabolic Panel:  Lab 99991111 2105 09/22/12 0430 09/21/12 0430 09/20/12 0451 09/19/12 0505 09/18/12 2040  NA 138 139 144 140 139 135  K 4.0 3.9 5.0 4.9 4.6 5.0  CL 101 106 113* 114* 112 113*  CO2 29 21 15* 15* 12* 9*  GLUCOSE 117* 114* 83 87 145* 309*  BUN 20 52* 79* 78* 80* 73*  CREATININE 3.24* 5.07* 7.27* 7.45* 6.87* 6.79*  CALCIUM 8.3* 8.3* -- -- -- --  MG -- -- -- -- -- --  PHOS 2.0* -- 5.2* -- -- --   Cardiac Enzymes: No results found for this basename:  CKTOTAL:3,CKMB:3,CKMBINDEX:3,TROPONINI:3 in the last 72 hours CBC:  Lab 09/24/12 2105 09/22/12 0430 09/21/12 0430 09/18/12 2040 09/18/12 1730 09/18/12 1654  WBC 6.0 6.4 7.4 8.7 -- --  NEUTROABS -- -- -- -- -- --  HGB 9.2* 9.4* 8.8* 7.6* 6.9* 6.8*  HCT 28.1* 27.3* 25.9* 22.3* 19.9* 20.6*  MCV 89.2 83.7 82.7 83.5 -- --  PLT 112* 164 178 169 -- --   PROTIME: No results found for this basename: LABPROT:3,INR:3 in the last 72 hours Liver Function Tests:  Basename 09/24/12 2105  AST --  ALT --  ALKPHOS --  BILITOT --  PROT --  ALBUMIN 2.8*   No results found for this basename: LIPASE:2,AMYLASE:2 in the last 72 hours BNP: BNP (last 3 results)  Basename 05/05/12 2302  PROBNP 32672.0*    ASSESSMENT AND PLAN:  Patient Active Hospital Problem List:   Renal insufficiency (XX123456)   Systolic CHF, chronic (123XX123)  ** ESRD (end stage renal disease) (05/05/2012)  euvolemic  Continue ace and bb Will see again Monday unless questions     Signed, Virl Axe MD  09/25/2012   

## 2012-09-25 NOTE — Progress Notes (Signed)
[facility] Progress Note    Trevor [name] [id number] DOB: [birth year] DOA: 09/17/2012 [facility]   Brief narrative: Mr. Staples is a 66 yo with hx of nonischemic CM, CKD IV, HTN admitted with elevated BP, anemia and worsening renal fxn. He developed hypotension after restarting his meds. The suspecion was that he was not actually taking his medications at home. He went on to develop ARF and now is deemed ESRd and HD dependent   Assessment/Plan:  HTN / Hypotension BP was very labile during admission -  Upon presentation BP was elevated then it dropped - then went up again . Better control after initiation of HD     Anemia Due to chronic renal disease - Fe and aranesp as per Nephrology  ESRD Has begun HD - Nephrology is following   Tobacco abuse Counseled on need to abstain   Acute on chronic Systolic CHF Volume control via HD .  Hepatitis C Will check LFTs and viral load to begin assessment - given compliance issues, he may not be a candidate for tx even if it is indicated  Code Status: Full Disposition Plan: snf when outpatient Hd arranged   Consultants: Nephrology Cardiology Vasc Surgery  Procedures: 10/22 - Ultrasound guided placement of right IJ Diatek catheter & Left basilic vein transposition  10/21 - Echo shows LV EF of 99991111, diastolic dysfunction  Antibiotics: none  DVT prophylaxis: SCDs  HPI/Subjective: No complains    Objective: Blood pressure 141/64, pulse 61, temperature 98 F (36.7 C), temperature source Oral, resp. rate 16, height 5\' 10"  (1.778 m), weight 64.275 kg (141 lb 11.2 oz), SpO2 98.00%.  Intake/Output Summary (Last 24 hours) at 09/25/12 1229 Last data filed at 09/24/12 1744  Gross per 24 hour  Intake      0 ml  Output   1550 ml  Net  -1550 ml    Patient Vitals for the past 24 hrs:  BP Temp Temp src Pulse Resp SpO2 Weight  09/25/12 0949 - 98 F (36.7 C) Oral 61  16  98 % -  09/25/12 0611 141/64 mmHg 98.5 F (36.9  C) Oral 65  16  100 % -  09/24/12 2200 163/62 mmHg 99.2 F (37.3 C) Oral 72  16  100 % 64.275 kg (141 lb 11.2 oz)  09/24/12 1829 145/72 mmHg - - - - - -  09/24/12 1744 137/68 mmHg 97.8 F (36.6 C) Oral 60  15  98 % 63.5 kg (139 lb 15.9 oz)  09/24/12 1740 123/56 mmHg - - 56  16  98 % -  09/24/12 1730 132/47 mmHg - - 58  15  - -  09/24/12 1708 147/64 mmHg - - - - - -  09/24/12 1700 67/33 mmHg - - 68  - 95 % -  09/24/12 1630 111/60 mmHg - - 60  - - -  09/24/12 1600 109/50 mmHg - - 58  - - -  09/24/12 1530 134/63 mmHg - - 57  - - -  09/24/12 1500 117/64 mmHg - - 59  - - -  09/24/12 1430 138/67 mmHg - - 68  - - -  09/24/12 1410 136/64 mmHg - - 60  - - -  09/24/12 1406 143/71 mmHg - - 72  - - -  09/24/12 1400 159/73 mmHg 98.1 F (36.7 C) Oral 69  16  97 % 65.7 kg (144 lb 13.5 oz)     Exam: General: No acute respiratory distress Lungs: Clear to auscultation bilaterally  without wheezes or crackles Cardiovascular: Regular rate and rhythm without murmur gallop or rub Abdomen: Nontender, nondistended, soft, bowel sounds positive, no rebound, no ascites, no appreciable mass Extremities: No significant cyanosis, clubbing, or edema bilateral lower extremities  Data Reviewed: Basic Metabolic Panel:  Lab 99991111 2105 09/22/12 0430 09/21/12 0430 09/20/12 0451 09/19/12 0505  NA 138 139 144 140 139  K 4.0 3.9 5.0 4.9 4.6  CL 101 106 113* 114* 112  CO2 29 21 15* 15* 12*  GLUCOSE 117* 114* 83 87 145*  BUN 20 52* 79* 78* 80*  CREATININE 3.24* 5.07* 7.27* 7.45* 6.87*  CALCIUM 8.3* 8.3* 8.7 7.9* 7.7*  MG -- -- -- -- --  PHOS 2.0* -- 5.2* 5.5* --   Liver Function Tests:  Lab 09/24/12 2105 09/21/12 0430 09/20/12 0451  AST -- -- --  ALT -- -- --  ALKPHOS -- -- --  BILITOT -- -- --  PROT -- -- --  ALBUMIN 2.8* 3.1* 2.8*   CBC:  Lab 09/24/12 2105 09/22/12 0430 09/21/12 0430 09/18/12 2040 09/18/12 1730  WBC 6.0 6.4 7.4 8.7 --  NEUTROABS -- -- -- -- --  HGB 9.2* 9.4* 8.8* 7.6* 6.9*    HCT 28.1* 27.3* 25.9* 22.3* 19.9*  MCV 89.2 83.7 82.7 83.5 --  PLT 112* 164 178 169 --   Cardiac Enzymes:  Lab 09/19/12 0948 09/19/12 0403 09/18/12 2326 09/18/12 2039  CKTOTAL -- -- -- --  CKMB -- -- -- --  CKMBINDEX -- -- -- --  TROPONINI <0.30 <0.30 <0.30 <0.30   BNP (last 3 results)  Basename 05/05/12 2302  PROBNP 32672.0*    Recent Results (from the past 240 hour(s))  MRSA PCR SCREENING     Status: Abnormal   Collection Time   09/18/12  6:14 PM      Component Value Range Status Comment   MRSA by PCR POSITIVE (*) NEGATIVE Final      Studies:  Recent x-ray studies have been reviewed in detail by the Attending Physician  Scheduled Meds:     . amLODipine  5 mg Oral Daily  . aspirin  81 mg Oral Daily  . calcium acetate  667 mg Oral TID WC  . carvedilol  12.5 mg Oral BID WC  . darbepoetin (ARANESP) injection - NON-DIALYSIS  200 mcg Subcutaneous Q Sun-1800  . ferric gluconate (FERRLECIT/NULECIT) IV  125 mg Intravenous Daily  . finasteride  5 mg Oral Daily  . lisinopril  10 mg Oral Daily  . loratadine  10 mg Oral Daily  . paricalcitol      . paricalcitol  2 mcg Intravenous 3 times weekly  . sodium bicarbonate  1,300 mg Oral TID  . sodium chloride  3 mL Intravenous Q12H  . sodium chloride  3 mL Intravenous Q12H  . Tamsulosin HCl  0.4 mg Oral BID      Keoni Risinger  [facility] Office  [phone] Pager [phone]  On-Call/Text Page:      [email or web address]      password [email or web address]  If 7PM-7AM, please contact night-coverage [email or web address] Password [email or web address] 09/25/2012, 12:29 PM   LOS: 8 days

## 2012-09-26 MED ORDER — HEPARIN SODIUM (PORCINE) 1000 UNIT/ML DIALYSIS
2000.0000 [IU] | INTRAMUSCULAR | Status: DC | PRN
  Administered 2012-09-27: 2000 [IU] via INTRAVENOUS_CENTRAL
  Filled 2012-09-26: qty 2

## 2012-09-26 NOTE — Progress Notes (Signed)
[facility]  Subjective:  No complaints, says he feels a lot better since starting dialysis.  Objective: Vital signs in last 24 hours: Blood pressure 147/50, pulse 68, temperature 99 F (37.2 C), temperature source Oral, resp. rate 20, height 5\' 10"  (1.778 m), weight 66.9 kg (147 lb 7.8 oz), SpO2 100.00%.    PHYSICAL EXAM General--as above Chest--clear, tunneled cath R IJ Heart--no rub Abd--nontender Extr--no edema, new AVF L upper arm  Lab Results:   Lab 09/24/12 2105 09/22/12 0430 09/21/12 0430 09/20/12 0451  NA 138 139 144 --  K 4.0 3.9 5.0 --  CL 101 106 113* --  CO2 29 21 15* --  BUN 20 52* 79* --  CREATININE 3.24* 5.07* 7.27* --  ALB -- -- -- --  GLUCOSE 117* -- -- --  CALCIUM 8.3* 8.3* 8.7 --  PHOS 2.0* -- 5.2* 5.5*     Basename 09/24/12 2105  WBC 6.0  HGB 9.2*  HCT 28.1*  PLT 112*    I have reviewed the patient's current medications.   Assessment/Plan 1. ESRD-- s/p HD x 3, last one was yest 10/25. Next inpt HD will be Monday. Reportedly approval given from [place] for outpt dialysis (according to Sherlon Handing, RN--Case Mgr) at [facility] MWF, but need to confirm this on Monday. OK for discharge from renal standpoint once outpt HD arranged. Has AVF.  2. HTN- on norvasc, coreg and lisinopril 3. Anemia with iron deficiency--on aranesp and iv Fe 4. H/O CVA--per primary  5. H/O AAA-- (noted by [facility]) < 3 cm  6. Hyperphosphatemia--phosphorus too low, d/c/ phoslo. PTH 330--now on zemplar  7. Cardiomyopathy with EF 40% and prev abnormal stress test ([place])--echo : Normal LV size, EF 30-35%, mod. Diastolic dysfunction, no valvular dysfct.   Kelly Splinter  MD [facility] [employer] [phone] pgr    [phone] cell 09/26/2012, 1:12 PM

## 2012-09-26 NOTE — Progress Notes (Signed)
[facility] Progress Note    Trevor [name] [id number] DOB: 1946-09-22 DOA: 09/17/2012 [facility]   Brief narrative: Trevor [name] is a 66 yo with hx of nonischemic CM, CKD IV, HTN admitted with elevated BP, anemia and worsening renal fxn. He developed hypotension after restarting his meds. The suspecion was that he was not actually taking his medications at home. He went on to develop ARF and now is deemed ESRd and HD dependent   Assessment/Plan:  HTN / Hypotension BP was very labile during admission -  Upon presentation BP was elevated then it dropped - then went up again . Better control after initiation of HD     Anemia Due to chronic renal disease - Fe and aranesp as per Nephrology  ESRD Has begun HD - Nephrology is following   Tobacco abuse Counseled on need to abstain   Acute on chronic Systolic CHF Volume control via HD .  Hepatitis C Will check LFTs and viral load to begin assessment - given compliance issues, he may not be a candidate for tx even if it is indicated  Code Status: Full Disposition Plan: snf when outpatient Hd arranged   Consultants: Nephrology Cardiology Vasc Surgery  Procedures: 10/22 - Ultrasound guided placement of right IJ Diatek catheter & Left basilic vein transposition  10/21 - Echo shows LV EF of 99991111, diastolic dysfunction  Antibiotics: none  DVT prophylaxis: SCDs  HPI/Subjective: No complains asleep but easily arousable.    Objective: Blood pressure 147/54, pulse 60, temperature 98.4 F (36.9 C), temperature source Oral, resp. rate 18, height 5\' 10"  (1.778 m), weight 66.9 kg (147 lb 7.8 oz), SpO2 97.00%.  Intake/Output Summary (Last 24 hours) at 09/26/12 1547 Last data filed at 09/26/12 1300  Gross per 24 hour  Intake    600 ml  Output      0 ml  Net    600 ml    Patient Vitals for the past 24 hrs:  BP Temp Temp src Pulse Resp SpO2 Weight  09/26/12 1400 147/54 mmHg 98.4 F (36.9 C) Oral 60  18  97 % -    09/26/12 1000 147/50 mmHg 99 F (37.2 C) Oral 68  20  100 % -  09/26/12 0538 138/52 mmHg 97.8 F (36.6 C) Oral 62  18  99 % -  09/25/12 2058 167/56 mmHg 99.3 F (37.4 C) Oral 70  18  99 % 66.9 kg (147 lb 7.8 oz)  09/25/12 1849 148/57 mmHg 98.6 F (37 C) Oral 68  16  100 % -     Exam: General: No acute respiratory distress Lungs: Clear to auscultation bilaterally without wheezes or crackles Cardiovascular: Regular rate and rhythm without murmur gallop or rub Abdomen: Nontender, nondistended, soft, bowel sounds positive, no rebound, no ascites, no appreciable mass Extremities: No significant cyanosis, clubbing, or edema bilateral lower extremities  Data Reviewed: Basic Metabolic Panel:  Lab 99991111 2105 09/22/12 0430 09/21/12 0430 09/20/12 0451  NA 138 139 144 140  K 4.0 3.9 5.0 4.9  CL 101 106 113* 114*  CO2 29 21 15* 15*  GLUCOSE 117* 114* 83 87  BUN 20 52* 79* 78*  CREATININE 3.24* 5.07* 7.27* 7.45*  CALCIUM 8.3* 8.3* 8.7 7.9*  MG -- -- -- --  PHOS 2.0* -- 5.2* 5.5*   Liver Function Tests:  Lab 09/24/12 2105 09/21/12 0430 09/20/12 0451  AST -- -- --  ALT -- -- --  ALKPHOS -- -- --  BILITOT -- -- --  PROT -- -- --  ALBUMIN 2.8* 3.1* 2.8*   CBC:  Lab 09/24/12 2105 09/22/12 0430 09/21/12 0430  WBC 6.0 6.4 7.4  NEUTROABS -- -- --  HGB 9.2* 9.4* 8.8*  HCT 28.1* 27.3* 25.9*  MCV 89.2 83.7 82.7  PLT 112* 164 178   Cardiac Enzymes: No results found for this basename: CKTOTAL:5,CKMB:5,CKMBINDEX:5,TROPONINI:5 in the last 168 hours BNP (last 3 results)  Basename 05/05/12 2302  PROBNP 32672.0*    Recent Results (from the past 240 hour(s))  MRSA PCR SCREENING     Status: Abnormal   Collection Time   09/18/12  6:14 PM      Component Value Range Status Comment   MRSA by PCR POSITIVE (*) NEGATIVE Final      Studies:  Recent x-ray studies have been reviewed in detail by the Attending Physician  Scheduled Meds:     . amLODipine  5 mg Oral Daily  .  aspirin  81 mg Oral Daily  . carvedilol  12.5 mg Oral BID WC  . darbepoetin (ARANESP) injection - NON-DIALYSIS  200 mcg Subcutaneous Q Sun-1800  . ferric gluconate (FERRLECIT/NULECIT) IV  125 mg Intravenous Daily  . finasteride  5 mg Oral Daily  . lisinopril  10 mg Oral Daily  . loratadine  10 mg Oral Daily  . paricalcitol  2 mcg Intravenous 3 times weekly  . sodium chloride  3 mL Intravenous Q12H  . sodium chloride  3 mL Intravenous Q12H  . Tamsulosin HCl  0.4 mg Oral BID  . DISCONTD: calcium acetate  667 mg Oral TID WC  . DISCONTD: sodium bicarbonate  1,300 mg Oral TID      [facility]  [facility] Office  [phone] Pager [phone]  On-Call/Text Page:      Trevor [name]      password [email or web address]  If 7PM-7AM, please contact night-coverage [email or web address] Password [email or web address] 09/26/2012, 3:47 PM   LOS: 9 days

## 2012-09-27 ENCOUNTER — Encounter (HOSPITAL_COMMUNITY): Payer: Self-pay | Admitting: Pharmacy Technician

## 2012-09-27 ENCOUNTER — Inpatient Hospital Stay (HOSPITAL_COMMUNITY): Payer: Medicare HMO

## 2012-09-27 DIAGNOSIS — R079 Chest pain, unspecified: Secondary | ICD-10-CM

## 2012-09-27 DIAGNOSIS — Z9119 Patient's noncompliance with other medical treatment and regimen: Secondary | ICD-10-CM

## 2012-09-27 DIAGNOSIS — N2581 Secondary hyperparathyroidism of renal origin: Secondary | ICD-10-CM | POA: Diagnosis present

## 2012-09-27 DIAGNOSIS — D649 Anemia, unspecified: Secondary | ICD-10-CM | POA: Diagnosis present

## 2012-09-27 LAB — TROPONIN I
Troponin I: <0.3 ng/mL (ref <0.30)
Troponin I: <0.3 ng/mL (ref <0.30)

## 2012-09-27 LAB — RENAL FUNCTION PANEL
BUN: 45 mg/dL — ABNORMAL HIGH (ref 6–23)
CO2: 26 mEq/L (ref 19–32)
GFR calc Af Amer: 11 mL/min — ABNORMAL LOW (ref >90)
Glucose, Bld: 83 mg/dL (ref 70–99)
Potassium: 4.6 mEq/L (ref 3.5–5.1)
Sodium: 138 mEq/L (ref 135–145)

## 2012-09-27 LAB — CBC
HCT: 28.2 % — ABNORMAL LOW (ref 39.0–52.0)
Hemoglobin: 9 g/dL — ABNORMAL LOW (ref 13.0–17.0)
RBC: 3.18 MIL/uL — ABNORMAL LOW (ref 4.22–5.81)
WBC: 6.7 10*3/uL (ref 4.0–10.5)

## 2012-09-27 LAB — HCV RNA QUANT
HCV Quantitative Log: 5.02 {Log} — ABNORMAL HIGH (ref <1.18)
HCV Quantitative: 104346 IU/mL — ABNORMAL HIGH (ref <15)

## 2012-09-27 MED ORDER — PARICALCITOL 5 MCG/ML IV SOLN
INTRAVENOUS | Status: AC
  Filled 2012-09-27: qty 1

## 2012-09-27 MED ORDER — RENA-VITE PO TABS
1.0000 | ORAL_TABLET | Freq: Every day | ORAL | Status: DC
  Administered 2012-09-27 – 2012-10-01 (×5): 1 via ORAL
  Filled 2012-09-27 (×6): qty 1

## 2012-09-27 MED ORDER — ALTEPLASE 2 MG IJ SOLR
4.0000 mg | Freq: Once | INTRAMUSCULAR | Status: AC
  Administered 2012-09-27: 4 mg
  Filled 2012-09-27 (×2): qty 4

## 2012-09-27 MED ORDER — ASPIRIN 81 MG PO CHEW
81.0000 mg | CHEWABLE_TABLET | Freq: Every day | ORAL | Status: DC
  Administered 2012-09-29 – 2012-10-01 (×3): 81 mg via ORAL
  Filled 2012-09-27 (×3): qty 1

## 2012-09-27 MED ORDER — SODIUM CHLORIDE 0.9 % IJ SOLN: 3.0000 mL | INTRAMUSCULAR | Status: DC | PRN

## 2012-09-27 MED ORDER — CARVEDILOL 3.125 MG PO TABS
3.1250 mg | ORAL_TABLET | Freq: Two times a day (BID) | ORAL | Status: DC
  Filled 2012-09-27 (×2): qty 1

## 2012-09-27 MED ORDER — ASPIRIN 81 MG PO CHEW
324.0000 mg | CHEWABLE_TABLET | ORAL | Status: AC
  Administered 2012-09-28: 324 mg via ORAL
  Filled 2012-09-27: qty 4

## 2012-09-27 MED ORDER — SODIUM CHLORIDE 0.9 % IJ SOLN: 3.0000 mL | Freq: Two times a day (BID) | INTRAMUSCULAR | Status: DC

## 2012-09-27 MED ORDER — SODIUM CHLORIDE 0.9 % IV SOLN: 250.0000 mL | INTRAVENOUS | Status: DC | PRN

## 2012-09-27 NOTE — Progress Notes (Signed)
S: Pt c/o chest pain in dialysis w/ EKG changes. On interview, denies cp, sob or lightheadedness currently. Described as substernal pressures lasting only "a few minutes" and resolved.   O:  PEx HEENT: normocephalic, atraumatic Neck: no bruits or JVD Lungs: CTAB without wheezes, rales or rhonchi CV: RRR, S1, S2, no rubs, heaves, gallops, clicks or thrills Extremities: DP/PT pulses 2+ bilaterally, no LE edema Neuro: A&O x 3, awake & responsive  Vitals During cp- HR 52, BP 86/42 Currently- HR 70, BP 153/76  Diagnostics Troponin pending EKG during cp- sinus bradycardia, LVH, LAD, ST dep (0.5-1 mm) V4-V6, TWI V3-V6, I, II, aVL, biphasic T wave V2, notched P waves V2-V4 EKG currently- NSR, STs nearly isoelectric, TWIs less prominent, TW upright V2  A/P:   66 yo with hx of NICM, CKD (stage IV), HTN (uncontrolled) admitted with elevated BP, anemia and worsening renal fxn. He developed hypotension after restarting his meds, started on and subsequently weaned off dopamine.   Chest pain noted and addressed on 10/19 consult note.  Suspected secondary to hypotension with EKG changes from LVH at that time. Serial enzymes negative x 4 since then. Similar type of chest pain endorsed today. Patient's primary was called and dialysis stopped. EKG during cp c/w prior tracings this admission, did show lateral ST depressions and deep TWIs. Redo EKG reveals STs nearly isoelectric and TWI changes resolved/less prominent. Patient currently pain free, and denies further ischemic symptoms. Likely secondary to hypotensive, bradycardic (?vagal) episode in dialysis today resulting in coronary underperfusion. Will check troponin which is pending. Hold BB. Continue to monitor. Will discuss utility/plans of ischemic eval this admission with MD, but sounds like he has significant issues with compliance/insight largely contributing to chronic disease progression.

## 2012-09-27 NOTE — Progress Notes (Signed)
Clinical Social Work-CSW left message with pt daughter to identify [name] choice-CSW will continue to f/u to attempt to obtain choice and facilitate d/c- Margreta Journey Barry [place]-MSW-[phone]

## 2012-09-27 NOTE — Progress Notes (Signed)
[facility] Progress Note    KARINA ZEISLOFT [id number] DOB: 17-Oct-1946 DOA: 09/17/2012 [facility]   Brief narrative: [name] is a 66 yo with hx of nonischemic CM, CKD IV, HTN admitted with elevated BP, anemia and worsening renal fxn. He developed hypotension after restarting his meds. The suspecion was that he was not actually taking his medications at home. He went on to develop ARF and now is deemed ESRd and HD dependent   Assessment/Plan:  HTN / Hypotension BP was very labile during admission -  Upon presentation BP was elevated then it dropped - then went up again . Better control after initiation of HD  09/27/12 developed hypotension and bradycardia with chest pain during HD. DCed acei and norvasc. Decreased BB dose to 3.125 BID. Also stat EKG and Troponin I ordered.  Cath 09/28/12   Anemia Due to chronic renal disease - Fe and aranesp as per Nephrology  ESRD Has begun HD - Nephrology is following   Tobacco abuse Counseled on need to abstain   Acute on chronic Systolic CHF Volume control via HD .  Hepatitis C Will check LFTs and viral load to begin assessment - given compliance issues, he may not be a candidate for tx even if it is indicated  Code Status: Full Disposition Plan: snf when outpatient Hd arranged   Consultants: Nephrology Cardiology Vasc Surgery  Procedures: 10/22 - Ultrasound guided placement of right IJ Diatek catheter & Left basilic vein transposition  10/21 - Echo shows LV EF of 99991111, diastolic dysfunction  Antibiotics: none  DVT prophylaxis: SCDs  HPI/Subjective: Had chest pain, bradycardia and hypotension during Hd    Objective: Blood pressure 117/52, pulse 46, temperature 98.5 F (36.9 C), temperature source Oral, resp. rate 21, height 5\' 10"  (1.778 m), weight 63.6 kg (140 lb 3.4 oz), SpO2 100.00%.  Intake/Output Summary (Last 24 hours) at 09/27/12 0913 Last data filed at 09/26/12 1300  Gross per 24 hour  Intake     360 ml  Output      0 ml  Net    360 ml    Patient Vitals for the past 24 hrs:  BP Temp Temp src Pulse Resp SpO2 Weight  09/27/12 0900 117/52 mmHg - - 46  21  - -  09/27/12 0846 102/42 mmHg - - 51  - - -  09/27/12 0840 86/46 mmHg - - - - - -  09/27/12 0830 132/62 mmHg - - 49  15  - -  09/27/12 0800 127/60 mmHg - - 61  16  - -  09/27/12 0730 133/72 mmHg - - 59  15  - -  09/27/12 0700 145/70 mmHg - - 55  15  - -  09/27/12 0648 157/70 mmHg - - 55  15  - -  09/27/12 0633 154/69 mmHg - - 56  16  100 % -  09/27/12 0628 158/62 mmHg 98.5 F (36.9 C) Oral 58  17  100 % 63.6 kg (140 lb 3.4 oz)  09/27/12 0541 161/58 mmHg 98.7 F (37.1 C) Oral 58  18  95 % -  09/26/12 2102 166/74 mmHg 98 F (36.7 C) Oral 76  18  95 % 65.8 kg (145 lb 1 oz)  09/26/12 1700 159/58 mmHg 98.4 F (36.9 C) Oral 66  18  99 % -  09/26/12 1400 147/54 mmHg 98.4 F (36.9 C) Oral 60  18  97 % -  09/26/12 1000 147/50 mmHg 99 F (37.2 C) Oral 68  20  100 % -     Exam: General: No acute respiratory distress Lungs: Clear to auscultation bilaterally without wheezes or crackles Cardiovascular: Regular rate and rhythm without murmur gallop or rub Abdomen: Nontender, nondistended, soft, bowel sounds positive, no rebound, no ascites, no appreciable mass Extremities: No significant cyanosis, clubbing, or edema bilateral lower extremities  Data Reviewed: Basic Metabolic Panel:  Lab 99991111 0650 09/24/12 2105 09/22/12 0430 09/21/12 0430  NA 138 138 139 144  K 4.6 4.0 3.9 5.0  CL 103 101 106 113*  CO2 26 29 21  15*  GLUCOSE 83 117* 114* 83  BUN 45* 20 52* 79*  CREATININE 5.75* 3.24* 5.07* 7.27*  CALCIUM 8.7 8.3* 8.3* 8.7  MG -- -- -- --  PHOS 3.9 2.0* -- 5.2*   Liver Function Tests:  Lab 09/27/12 0650 09/24/12 2105 09/21/12 0430  AST -- -- --  ALT -- -- --  ALKPHOS -- -- --  BILITOT -- -- --  PROT -- -- --  ALBUMIN 2.6* 2.8* 3.1*   CBC:  Lab 09/27/12 0650 09/24/12 2105 09/22/12 0430 09/21/12 0430  WBC 6.7  6.0 6.4 7.4  NEUTROABS -- -- -- --  HGB 9.0* 9.2* 9.4* 8.8*  HCT 28.2* 28.1* 27.3* 25.9*  MCV 88.7 89.2 83.7 82.7  PLT 142* 112* 164 178   Cardiac Enzymes: No results found for this basename: CKTOTAL:5,CKMB:5,CKMBINDEX:5,TROPONINI:5 in the last 168 hours BNP (last 3 results)  Basename 05/05/12 2302  PROBNP 32672.0*    Recent Results (from the past 240 hour(s))  MRSA PCR SCREENING     Status: Abnormal   Collection Time   09/18/12  6:14 PM      Component Value Range Status Comment   MRSA by PCR POSITIVE (*) NEGATIVE Final      Studies:  Recent x-ray studies have been reviewed in detail by the Attending Physician  Scheduled Meds:     . amLODipine  5 mg Oral Daily  . aspirin  81 mg Oral Daily  . carvedilol  12.5 mg Oral BID WC  . darbepoetin (ARANESP) injection - NON-DIALYSIS  200 mcg Subcutaneous Q Sun-1800  . ferric gluconate (FERRLECIT/NULECIT) IV  125 mg Intravenous Daily  . finasteride  5 mg Oral Daily  . lisinopril  10 mg Oral Daily  . loratadine  10 mg Oral Daily  . paricalcitol      . paricalcitol  2 mcg Intravenous 3 times weekly  . sodium chloride  3 mL Intravenous Q12H  . sodium chloride  3 mL Intravenous Q12H  . Tamsulosin HCl  0.4 mg Oral BID  . DISCONTD: calcium acetate  667 mg Oral TID WC  . DISCONTD: sodium bicarbonate  1,300 mg Oral TID      [facility]  [facility] Office  [phone] Pager [phone]  On-Call/Text Page:      [email or web address]      password [email or web address]  If 7PM-7AM, please contact night-coverage [email or web address] Password [email or web address] 09/27/2012, 9:13 AM   LOS: 10 days

## 2012-09-27 NOTE — Progress Notes (Signed)
PROGRESS NOTE  Primary Cardiologist:  Kirk Ruths, MD  Subjective:   Trevor [name] is a 66 yo with hx of nonischemic CM, CKD IV, HTN admitted with elevated BP, anemia and worsening renal fxn.  He developed hypotension after restarting his meds.  He was probably  not actually taking his medications at home.    Echo shows LV EF of 99991111, diastolic dysfunction,  He has been on dialysis.  He had some bradycardia and hypotension and associated CP with dialysis this am.   The symptoms resolved with dialysis.   ECG shows LVH with repol changes.   He is finished   Objective:    Vital Signs:   Temp:  [98 F (36.7 C)-98.7 F (37.1 C)] 98 F (36.7 C) (10/28 0934) Pulse Rate:  [46-76] 56  (10/28 0934) Resp:  [15-21] 16  (10/28 0934) BP: (86-166)/(42-76) 153/76 mmHg (10/28 0934) SpO2:  [95 %-100 %] 100 % (10/28 0934) Weight:  [140 lb 3.4 oz (63.6 kg)-145 lb 1 oz (65.8 kg)] 140 lb 3.4 oz (63.6 kg) (10/28 0628)  Last BM Date: 09/22/12   24-hour weight change: Weight change: -2 lb 6.8 oz (-1.1 kg)  Weight trends: Filed Weights   09/25/12 2058 09/26/12 2102 09/27/12 0628  Weight: 147 lb 7.8 oz (66.9 kg) 145 lb 1 oz (65.8 kg) 140 lb 3.4 oz (63.6 kg)    Intake/Output:  10/27 0701 - 10/28 0700 In: 600 [P.O.:600] Out: -      Physical Exam: BP 153/76  Pulse 56  Temp 98 F (36.7 C) (Oral)  Resp 16  Ht 5\' 10"  (1.778 m)  Wt 140 lb 3.4 oz (63.6 kg)  BMI 20.12 kg/m2  SpO2 100%  General: Vital signs reviewed and noted. Chronically ill appearing  Head: Normocephalic, atraumatic.  Eyes: conjunctivae/corneas clear.  EOM's intact.   Throat: normal  Neck: Supple. Normal carotids. No JVD  Lungs:  Clear   Heart: Regular rate,  With normal  S1 S2. No murmurs,   Abdomen:  Soft, non-tender, non-distended with normoactive bowel sounds. No hepatomegaly. No rebound/guarding. No abdominal masses.  Extremities: Distal pedal pulses are 2+ .  No edema.    Neurologic: A&O X3, CN II - XII are  grossly intact. Motor strength is 5/5 in the all 4 extremities.  Psych: Asleep     Labs: BMET:  Basename 09/27/12 0650 09/24/12 2105  NA 138 138  K 4.6 4.0  CL 103 101  CO2 26 29  GLUCOSE 83 117*  BUN 45* 20  CREATININE 5.75* 3.24*  CALCIUM 8.7 8.3*  MG -- --  PHOS 3.9 2.0*    Liver function tests:  Basename 09/27/12 0650 09/24/12 2105  AST -- --  ALT -- --  ALKPHOS -- --  BILITOT -- --  PROT -- --  ALBUMIN 2.6* 2.8*   No results found for this basename: LIPASE:2,AMYLASE:2 in the last 72 hours  CBC:  Basename 09/27/12 0650 09/24/12 2105  WBC 6.7 6.0  NEUTROABS -- --  HGB 9.0* 9.2*  HCT 28.2* 28.1*  MCV 88.7 89.2  PLT 142* 112*    Cardiac Enzymes: No results found for this basename: CKTOTAL:4,CKMB:4,TROPONINI:4 in the last 72 hours  Coagulation Studies: No results found for this basename: LABPROT:5,INR:5 in the last 72 hours No results found for this basename: VITAMINB12,FOLATE,FERRITIN,TIBC,IRON,RETICCTPCT in the last 72 hours   Tele: 09/20/12- NSR  at 91  ECG:  Sinus brady. LVH with repol.  Medications:    Infusions:  Scheduled Medications:    . alteplase  4 mg Intracatheter Once  . aspirin  81 mg Oral Daily  . darbepoetin (ARANESP) injection - NON-DIALYSIS  200 mcg Subcutaneous Q Sun-1800  . ferric gluconate (FERRLECIT/NULECIT) IV  125 mg Intravenous Daily  . finasteride  5 mg Oral Daily  . loratadine  10 mg Oral Daily  . multivitamin  1 tablet Oral Daily  . paricalcitol      . paricalcitol  2 mcg Intravenous 3 times weekly  . sodium chloride  3 mL Intravenous Q12H  . sodium chloride  3 mL Intravenous Q12H  . Tamsulosin HCl  0.4 mg Oral BID  . DISCONTD: amLODipine  5 mg Oral Daily  . DISCONTD: calcium acetate  667 mg Oral TID WC  . DISCONTD: carvedilol  12.5 mg Oral BID WC  . DISCONTD: carvedilol  3.125 mg Oral BID WC  . DISCONTD: lisinopril  10 mg Oral Daily  . DISCONTD: sodium bicarbonate  1,300 mg Oral TID    Assessment/ Plan:     Hypertension: Will add Lisinopril 10 - to be given after dialysis per Dr. Hassell Done.  Systolic CHF, chronic (123XX123) Echo shows EF of 30-35%.   He has been noncompliant with his medications.   Coreg.   Chest pain:  Pt has had episodes of angina in the past but cardiac was not done in fear of worsening of his renal insufficiency.  He has developed  ESRD and will need dialysis from now on.  Given his symptoms I think we should proceed with cath.  Will schedule cath  for tomorrow.  ESRD (end stage renal disease) (05/05/2012)  plan per int. Med.   Disposition:  Length of Stay: 10  Thayer Headings, Brooke Bonito., MD, [facility] 09/27/2012, 10:10 AM Office [phone] Pager [phone]

## 2012-09-27 NOTE — Progress Notes (Signed)
OT Cancellation Note  Patient Details Name: Trevor [name] MRN: [id number] DOB: 12/13/1945   Cancelled Treatment:    Reason Eval/Treat Not Completed: Medical issues which prohibited therapy (Bradycardic with chest pain in HD this am). Cardiac cath scheduled for tomorrow.  09/27/2012 Darrol Jump OTR/L Pager [phone] Office [phone]

## 2012-09-27 NOTE — Progress Notes (Signed)
Patient received hemodialysis beginning 0633. Two hours into the treatment bradycardia(45-49 bpm) was observed. The beginning heart rate was in the low 70s. Patient had no symptoms when bradycardia began. Dr  Marye Round was paged regarding the slower heart rate. At [place] patient began to experience sternal chest pain, 02 at 2 liter was administered along with 124ml of normal saline, the ultrafiltration was also stopped. Dr Marye Round was at the patients bedside and ordered Troponin and a stat EKG. With the standing orders for chest pain implemented, the chest pain has subsided with heart rate remaining bradycardic . EKG was complete with noted changes. Dr Marye Round spoke with Dr Deterding regarding this event and it has been decided to discontinue the hemodialysis treatment. With the treatment discontinued the patients blood pressure is now 153/72 and heart rate 72 bpm. There are no complaints of chest pain upon discharge from hemodialysis.

## 2012-09-27 NOTE — Progress Notes (Signed)
PT Cancellation Note  Patient Details Name: Trevor [name] MRN: [id number] DOB: 08-21-1946   Cancelled Treatment:    Reason Eval/Treat Not Completed: Medical issues which prohibited therapy (Bradycardic with chest pain in HD this am); Cardiac labs were ordered, results pending  Noted for Cardiac Cath tomorrow;   [place] 09/27/2012, 11:45 AM

## 2012-09-27 NOTE — Procedures (Signed)
I was present at this session.  I have reviewed the session itself and made appropriate changes.  Low HR an CP, taken off.  Rahil Passey L 10/28/20139:31 AM

## 2012-09-27 NOTE — Progress Notes (Signed)
Subjective: Interval History: has complaints had CP earlier.  Now no CP .  Objective: Vital signs in last 24 hours: Temp:  [98 F (36.7 C)-98.7 F (37.1 C)] 98.5 F (36.9 C) (10/28 0628) Pulse Rate:  [46-76] 56  (10/28 0934) Resp:  [15-21] 16  (10/28 0934) BP: (86-166)/(42-76) 153/76 mmHg (10/28 0934) SpO2:  [95 %-100 %] 100 % (10/28 0934) Weight:  [63.6 kg (140 lb 3.4 oz)-65.8 kg (145 lb 1 oz)] 63.6 kg (140 lb 3.4 oz) (10/28 [id number]) Weight change: -1.1 kg (-2 lb 6.8 oz)  Intake/Output from previous day: 10/27 0701 - 10/28 0700 In: 600 [P.O.:600] Out: -  Intake/Output this shift:    General appearance: alert and slowed mentation Resp: diminished breath sounds bilaterally Chest wall: no tenderness, PC R IJ Cardio: S1, S2 normal and systolic murmur: holosystolic 2/6, blowing at apex GI: soft, pos bs, liver down 5 cm AVF LUA B&T  Lab Results:  [facility] 09/27/12 0650 09/24/12 2105  WBC 6.7 6.0  HGB 9.0* 9.2*  HCT 28.2* 28.1*  PLT 142* 112*   BMET:  Basename 09/27/12 0650 09/24/12 2105  NA 138 138  K 4.6 4.0  CL 103 101  CO2 26 29  GLUCOSE 83 117*  BUN 45* 20  CREATININE 5.75* 3.24*  CALCIUM 8.7 8.3*   No results found for this basename: PTH:2 in the last 72 hours [place] Studies: No results found for this basename: IRON,TIBC,TRANSFERRIN,FERRITIN in the last 72 hours  Studies/Results: No results found.  I have reviewed the patient's current medications.  Assessment/Plan: 1 ESRD on HD , cp limited, eval and f/u 2 CP to get cards eval.  With low HR consider d/c coreg 3 HTN fair contro 4 Anemia epo/fe 5 HPTH Vit D 6 Nonadherence ??if will follow regimen.  This progression was expected, explained, and he ignored P F/U chem, hb, epo R/o Cards eval, counseling    LOS: 10 days   Brevin Mcfadden L 09/27/2012,10:02 AM

## 2012-09-27 NOTE — Progress Notes (Signed)
Clinical Social Work-CSW able to confirm bed at [facility] with [name] and dtr-CSW has initiated [facility] "fast track" and will facilitate pt d/c when medically stable- Gerre Scull, [phone]

## 2012-09-28 ENCOUNTER — Encounter (HOSPITAL_COMMUNITY)
Admission: EM | Disposition: A | Discharge status: Skilled Nursing Facility | Payer: Self-pay | Source: Home / Self Care | Attending: Internal Medicine

## 2012-09-28 DIAGNOSIS — I2510 Atherosclerotic heart disease of native coronary artery without angina pectoris: Secondary | ICD-10-CM | POA: Diagnosis present

## 2012-09-28 HISTORY — PX: LEFT HEART CATHETERIZATION WITH CORONARY ANGIOGRAM: SHX5451

## 2012-09-28 LAB — COMPREHENSIVE METABOLIC PANEL
ALT: 8 U/L (ref 0–53)
AST: 15 U/L (ref 0–37)
Albumin: 2.6 g/dL — ABNORMAL LOW (ref 3.5–5.2)
Alkaline Phosphatase: 84 U/L (ref 39–117)
BUN: 36 mg/dL — ABNORMAL HIGH (ref 6–23)
Chloride: 102 mEq/L (ref 96–112)
Potassium: 3.5 mEq/L (ref 3.5–5.1)
Sodium: 136 mEq/L (ref 135–145)
Total Bilirubin: 0.3 mg/dL (ref 0.3–1.2)
Total Protein: 6 g/dL (ref 6.0–8.3)

## 2012-09-28 LAB — CBC
MCV: 89.1 fL (ref 78.0–100.0)
Platelets: 131 10*3/uL — ABNORMAL LOW (ref 150–400)
RBC: 3.13 MIL/uL — ABNORMAL LOW (ref 4.22–5.81)
RDW: 15.1 % (ref 11.5–15.5)
WBC: 5.3 10*3/uL (ref 4.0–10.5)

## 2012-09-28 SURGERY — LEFT HEART CATHETERIZATION WITH CORONARY ANGIOGRAM
Anesthesia: LOCAL

## 2012-09-28 MED ORDER — ACETAMINOPHEN 325 MG PO TABS: 650.0000 mg | ORAL_TABLET | ORAL | Status: DC | PRN

## 2012-09-28 MED ORDER — LIDOCAINE-PRILOCAINE 2.5-2.5 % EX CREA: 1.0000 "application " | TOPICAL_CREAM | CUTANEOUS | Status: DC | PRN

## 2012-09-28 MED ORDER — NEPRO/CARBSTEADY PO LIQD: 237.0000 mL | ORAL | Status: DC | PRN

## 2012-09-28 MED ORDER — MIDAZOLAM HCL 2 MG/2ML IJ SOLN
INTRAMUSCULAR | Status: AC
  Filled 2012-09-28: qty 2

## 2012-09-28 MED ORDER — ASPIRIN 81 MG PO CHEW: 81.0000 mg | CHEWABLE_TABLET | Freq: Every day | ORAL | Status: DC

## 2012-09-28 MED ORDER — NITROGLYCERIN 0.2 MG/ML ON CALL CATH LAB
INTRAVENOUS | Status: AC
  Filled 2012-09-28: qty 1

## 2012-09-28 MED ORDER — ALTEPLASE 2 MG IJ SOLR
2.0000 mg | Freq: Once | INTRAMUSCULAR | Status: AC | PRN
  Filled 2012-09-28: qty 2

## 2012-09-28 MED ORDER — DIALYVITE PO TABS: 1.0000 | ORAL_TABLET | Freq: Every day | ORAL | Status: DC

## 2012-09-28 MED ORDER — HEPARIN (PORCINE) IN NACL 2-0.9 UNIT/ML-% IJ SOLN
INTRAMUSCULAR | Status: AC
  Filled 2012-09-28: qty 1000

## 2012-09-28 MED ORDER — SODIUM CHLORIDE 0.9 % IJ SOLN: 3.0000 mL | INTRAMUSCULAR | Status: DC | PRN

## 2012-09-28 MED ORDER — PENTAFLUOROPROP-TETRAFLUOROETH EX AERO: 1.0000 "application " | INHALATION_SPRAY | CUTANEOUS | Status: DC | PRN

## 2012-09-28 MED ORDER — HEPARIN SODIUM (PORCINE) 1000 UNIT/ML DIALYSIS
100.0000 [IU]/kg | INTRAMUSCULAR | Status: DC | PRN
  Administered 2012-09-29: 6500 [IU] via INTRAVENOUS_CENTRAL
  Filled 2012-09-28: qty 7

## 2012-09-28 MED ORDER — SODIUM CHLORIDE 0.9 % IV SOLN: 250.0000 mL | INTRAVENOUS | Status: DC

## 2012-09-28 MED ORDER — SODIUM CHLORIDE 0.9 % IV SOLN: 100.0000 mL | INTRAVENOUS | Status: DC | PRN

## 2012-09-28 MED ORDER — AMLODIPINE BESYLATE 5 MG PO TABS: 5.0000 mg | ORAL_TABLET | Freq: Every day | ORAL | Status: DC

## 2012-09-28 MED ORDER — ONDANSETRON HCL 4 MG/2ML IJ SOLN: 4.0000 mg | Freq: Four times a day (QID) | INTRAMUSCULAR | Status: DC | PRN

## 2012-09-28 MED ORDER — HEPARIN SODIUM (PORCINE) 1000 UNIT/ML DIALYSIS
1000.0000 [IU] | INTRAMUSCULAR | Status: DC | PRN
  Filled 2012-09-28: qty 1

## 2012-09-28 MED ORDER — SODIUM CHLORIDE 0.9 % IJ SOLN: 3.0000 mL | Freq: Two times a day (BID) | INTRAMUSCULAR | Status: DC

## 2012-09-28 MED ORDER — FENTANYL CITRATE 0.05 MG/ML IJ SOLN
INTRAMUSCULAR | Status: AC
  Filled 2012-09-28: qty 2

## 2012-09-28 MED ORDER — LIDOCAINE HCL (PF) 1 % IJ SOLN: 5.0000 mL | INTRAMUSCULAR | Status: DC | PRN

## 2012-09-28 MED ORDER — AMLODIPINE BESYLATE 5 MG PO TABS
5.0000 mg | ORAL_TABLET | Freq: Every day | ORAL | Status: DC
  Filled 2012-09-28 (×3): qty 1

## 2012-09-28 MED ORDER — LIDOCAINE HCL (PF) 1 % IJ SOLN
INTRAMUSCULAR | Status: AC
  Filled 2012-09-28: qty 30

## 2012-09-28 NOTE — Progress Notes (Signed)
[facility] Progress Note    Trevor [name] [id number] DOB: 02/07/1946 DOA: 09/17/2012 [facility]   Brief narrative: Trevor [name] is a 66 yo with hx of nonischemic CM, CKD IV, HTN admitted with elevated BP, anemia and worsening renal fxn. He developed hypotension after restarting his meds. The suspecion was that he was not actually taking his medications at home. He went on to develop ARF and now is deemed ESRd and HD dependent   Assessment/Plan:  HTN / Hypotension BP was very labile during admission -  Upon presentation BP was elevated then it dropped - then went up again . Better control after initiation of HD  09/27/12 developed hypotension and bradycardia with chest pain during HD. 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  Hepatitis C HCV Quantitative [id number]  Needs outpatient   Code Status: Full Disposition Plan: snf when outpatient Hd arranged   Consultants: Nephrology Cardiology Vasc Surgery  Procedures: 10/22 - Ultrasound guided placement of right IJ Diatek catheter & Left basilic vein transposition  10/21 - Echo shows LV EF of 99991111, diastolic dysfunction  Antibiotics: none  DVT prophylaxis: SCDs  HPI/Subjective: Had cath feeling fine now    Objective: Blood pressure 171/79, pulse 76, temperature 97.4 F (36.3 C), temperature source Oral, resp. rate 18, height 5\' 10"  (1.778 m), weight 64.6 kg (142 lb 6.7 oz), SpO2 100.00%.  Intake/Output Summary (Last 24 hours) at 09/28/12 1710 Last data filed at 09/28/12 1500  Gross per 24 hour  Intake   1556 ml  Output      0 ml  Net   1556 ml    Patient Vitals for the past 24 hrs:  BP Temp Temp src Pulse Resp SpO2 Weight  09/28/12 1624 171/79 mmHg  97.4 F (36.3 C) Oral 76  18  100 % -  09/28/12 1355 162/64 mmHg 97.7 F (36.5 C) - 73  - 100 % -  09/28/12 1100 167/64 mmHg - - 61  - - -  09/28/12 1028 157/55 mmHg - - 57  - - -  09/28/12 1010 167/55 mmHg - - 62  - - -  09/28/12 0938 174/56 mmHg 97.6 F (36.4 C) - 58  18  100 % -  09/28/12 0916 158/65 mmHg 97.6 F (36.4 C) - 62  17  100 % -  09/28/12 0732 - - - 63  - - -  09/28/12 0442 168/69 mmHg 98.9 F (37.2 C) Oral 65  16  98 % -  09/27/12 2058 159/62 mmHg 99.2 F (37.3 C) Oral 76  16  100 % 64.6 kg (142 lb 6.7 oz)  09/27/12 1734 188/70 mmHg 99 F (37.2 C) - 74  18  96 % -     Exam: General: No acute respiratory distress Lungs: Clear to auscultation bilaterally without wheezes or crackles Cardiovascular: Regular rate and rhythm without murmur gallop or rub Abdomen: Nontender, nondistended, soft, bowel sounds positive, no rebound, no ascites, no appreciable mass Extremities: No significant cyanosis, clubbing, or edema bilateral lower extremities  Data Reviewed: Basic Metabolic Panel:  Lab [id number] 1340 09/27/12 0650 09/24/12 2105 09/22/12 0430  NA 136 138 138 139  K 3.5 4.6 4.0 3.9  CL 102  103 101 106  CO2 25 26 29 21   GLUCOSE 112* 83 117* 114*  BUN 36* 45* 20 52*  CREATININE 4.99* 5.75* 3.24* 5.07*  CALCIUM 8.4 8.7 8.3* 8.3*  MG -- -- -- --  PHOS 2.9 3.9 2.0* --   Liver Function Tests:  Lab 09/28/12 1340 09/27/12 0650 09/24/12 2105  AST 15 -- --  ALT 8 -- --  ALKPHOS 84 -- --  BILITOT 0.3 -- --  PROT 6.0 -- --  ALBUMIN 2.6* 2.6* 2.8*   CBC:  Lab 09/28/12 1340 09/27/12 0650 09/24/12 2105 09/22/12 0430  WBC 5.3 6.7 6.0 6.4  NEUTROABS -- -- -- --  HGB 9.0* 9.0* 9.2* 9.4*  HCT 27.9* 28.2* 28.1* 27.3*  MCV 89.1 88.7 89.2 83.7  PLT 131* 142* 112* 164   Cardiac Enzymes:  Lab 09/27/12 2122 09/27/12 1533 09/27/12 0915  CKTOTAL -- -- --  CKMB -- -- --  CKMBINDEX -- -- --  TROPONINI <0.30 <0.30 <0.30   BNP (last 3 results)  Basename 05/05/12 2302    PROBNP 32672.0*    Recent Results (from the past 240 hour(s))  MRSA PCR SCREENING     Status: Abnormal   Collection Time   09/18/12  6:14 PM      Component Value Range Status Comment   MRSA by PCR POSITIVE (*) NEGATIVE Final      Studies:  Recent x-ray studies have been reviewed in detail by the Attending Physician  Scheduled Meds:     . aspirin  324 mg Oral Pre-Cath  . aspirin  81 mg Oral Daily  . darbepoetin (ARANESP) injection - NON-DIALYSIS  200 mcg Subcutaneous Q Sun-1800  . fentaNYL      . ferric gluconate (FERRLECIT/NULECIT) IV  125 mg Intravenous Daily  . finasteride  5 mg Oral Daily  . heparin      . lidocaine      . loratadine  10 mg Oral Daily  . midazolam      . multivitamin  1 tablet Oral Daily  . nitroGLYCERIN      . paricalcitol      . paricalcitol  2 mcg Intravenous 3 times weekly  . sodium chloride  3 mL Intravenous Q12H  . Tamsulosin HCl  0.4 mg Oral BID  . DISCONTD: sodium chloride  3 mL Intravenous Melvia Heaps  [facility] Office  [phone] Pager [phone]  On-Call/Text Page:      [email or web address]      password [email or web address]  If 7PM-7AM, please contact night-coverage [email or web address] Password [email or web address] 09/28/2012, 5:10 PM   LOS: 11 days

## 2012-09-28 NOTE — Progress Notes (Signed)
cathflo instilled into each lumen of hemodialysis catheter on 09/27/2012 for inadequate function. Cathflo removed 09/28/2012 at 1700 09/28/2012. Hemodialysis catheter lumens flushed well

## 2012-09-28 NOTE — H&P (View-Only) (Signed)
PROGRESS NOTE  Primary Cardiologist:  Kirk Ruths, MD  Subjective:   Trevor [name] is a 66 yo with hx of nonischemic CM, CKD IV, HTN admitted with elevated BP, anemia and worsening renal fxn.  He developed hypotension after restarting his meds.  He was probably  not actually taking his medications at home.    Echo shows LV EF of 99991111, diastolic dysfunction,  He has been on dialysis.  He had some bradycardia and hypotension and associated CP with dialysis this am.   The symptoms resolved with dialysis.   ECG shows LVH with repol changes.   He is finished   Objective:    Vital Signs:   Temp:  [98 F (36.7 C)-98.7 F (37.1 C)] 98 F (36.7 C) (10/28 0934) Pulse Rate:  [46-76] 56  (10/28 0934) Resp:  [15-21] 16  (10/28 0934) BP: (86-166)/(42-76) 153/76 mmHg (10/28 0934) SpO2:  [95 %-100 %] 100 % (10/28 0934) Weight:  [140 lb 3.4 oz (63.6 kg)-145 lb 1 oz (65.8 kg)] 140 lb 3.4 oz (63.6 kg) (10/28 0628)  Last BM Date: 09/22/12   24-hour weight change: Weight change: -2 lb 6.8 oz (-1.1 kg)  Weight trends: Filed Weights   09/25/12 2058 09/26/12 2102 09/27/12 0628  Weight: 147 lb 7.8 oz (66.9 kg) 145 lb 1 oz (65.8 kg) 140 lb 3.4 oz (63.6 kg)    Intake/Output:  10/27 0701 - 10/28 0700 In: 600 [P.O.:600] Out: -      Physical Exam: BP 153/76  Pulse 56  Temp 98 F (36.7 C) (Oral)  Resp 16  Ht 5\' 10"  (1.778 m)  Wt 140 lb 3.4 oz (63.6 kg)  BMI 20.12 kg/m2  SpO2 100%  General: Vital signs reviewed and noted. Chronically ill appearing  Head: Normocephalic, atraumatic.  Eyes: conjunctivae/corneas clear.  EOM's intact.   Throat: normal  Neck: Supple. Normal carotids. No JVD  Lungs:  Clear   Heart: Regular rate,  With normal  S1 S2. No murmurs,   Abdomen:  Soft, non-tender, non-distended with normoactive bowel sounds. No hepatomegaly. No rebound/guarding. No abdominal masses.  Extremities: Distal pedal pulses are 2+ .  No edema.    Neurologic: A&O X3, CN II - XII are  grossly intact. Motor strength is 5/5 in the all 4 extremities.  Psych: Asleep     Labs: BMET:  Basename 09/27/12 0650 09/24/12 2105  NA 138 138  K 4.6 4.0  CL 103 101  CO2 26 29  GLUCOSE 83 117*  BUN 45* 20  CREATININE 5.75* 3.24*  CALCIUM 8.7 8.3*  MG -- --  PHOS 3.9 2.0*    Liver function tests:  Basename 09/27/12 0650 09/24/12 2105  AST -- --  ALT -- --  ALKPHOS -- --  BILITOT -- --  PROT -- --  ALBUMIN 2.6* 2.8*   No results found for this basename: LIPASE:2,AMYLASE:2 in the last 72 hours  CBC:  Basename 09/27/12 0650 09/24/12 2105  WBC 6.7 6.0  NEUTROABS -- --  HGB 9.0* 9.2*  HCT 28.2* 28.1*  MCV 88.7 89.2  PLT 142* 112*    Cardiac Enzymes: No results found for this basename: CKTOTAL:4,CKMB:4,TROPONINI:4 in the last 72 hours  Coagulation Studies: No results found for this basename: LABPROT:5,INR:5 in the last 72 hours No results found for this basename: VITAMINB12,FOLATE,FERRITIN,TIBC,IRON,RETICCTPCT in the last 72 hours   Tele: 09/20/12- NSR  at 91  ECG:  Sinus brady. LVH with repol.  Medications:    Infusions:  Scheduled Medications:    . alteplase  4 mg Intracatheter Once  . aspirin  81 mg Oral Daily  . darbepoetin (ARANESP) injection - NON-DIALYSIS  200 mcg Subcutaneous Q Sun-1800  . ferric gluconate (FERRLECIT/NULECIT) IV  125 mg Intravenous Daily  . finasteride  5 mg Oral Daily  . loratadine  10 mg Oral Daily  . multivitamin  1 tablet Oral Daily  . paricalcitol      . paricalcitol  2 mcg Intravenous 3 times weekly  . sodium chloride  3 mL Intravenous Q12H  . sodium chloride  3 mL Intravenous Q12H  . Tamsulosin HCl  0.4 mg Oral BID  . DISCONTD: amLODipine  5 mg Oral Daily  . DISCONTD: calcium acetate  667 mg Oral TID WC  . DISCONTD: carvedilol  12.5 mg Oral BID WC  . DISCONTD: carvedilol  3.125 mg Oral BID WC  . DISCONTD: lisinopril  10 mg Oral Daily  . DISCONTD: sodium bicarbonate  1,300 mg Oral TID    Assessment/ Plan:     Hypertension: Will add Lisinopril 10 - to be given after dialysis per Dr. Hassell Done.  Systolic CHF, chronic (123XX123) Echo shows EF of 30-35%.   He has been noncompliant with his medications.   Coreg.   Chest pain:  Pt has had episodes of angina in the past but cardiac was not done in fear of worsening of his renal insufficiency.  He has developed  ESRD and will need dialysis from now on.  Given his symptoms I think we should proceed with cath.  Will schedule cath  for tomorrow.  ESRD (end stage renal disease) (05/05/2012)  plan per int. Med.   Disposition:  Length of Stay: 10  Thayer Headings, Brooke Bonito., MD, [facility] 09/27/2012, 10:10 AM Office [phone] Pager [phone]

## 2012-09-28 NOTE — Discharge Summary (Signed)
Physician Discharge Summary  Trevor [name] [id number] DOB: Feb 03, 1946 DOA: 09/17/2012   Admit date: 09/17/2012 Discharge date: 09/29/2012   Recommendations for Outpatient Follow-up:   Discharge Diagnoses:  ESRD (end stage renal disease)  Nonischemic cardiomyopathy  Stroke  Cocaine abuse  Severe sinus bradycardia  Anemia  Secondary hyperparathyroidism  Non compliance with medical treatment  CAD (coronary artery disease)   Discharge Condition: good, to snf on HD  Diet recommendation: renal   Filed Weights   09/27/12 0628 09/27/12 0934 09/27/12 2058  Weight: 63.6 kg (140 lb 3.4 oz) 64.1 kg (141 lb 5 oz) 64.6 kg (142 lb 6.7 oz)    History of present illness:   66 yo man with CKD stage IV , cardiomyopathy ejection fraction 40% with myoview in September 2012 revealing apical thinning and global hypokinesis EF 40%. He has not had a cardiac catheterization because of his kidney disease. Presented to the hospital today because his blood pressure was elevated at home. He has had history of non compliance with recommended blood pressure regimen and he mentions that he wanted to wean himself off of his medications because he felt over medicated. He has also had difficulty seeing which pill is what and he has his brother and [employer] nurse help him with his medications at home. He denies any melena, BRBPR, hematuria, headaches, or abdominal discomfort.  In the ED was found to have elevated blood pressures with highest documented at 188/97 and patient was subsequently given labetalol. Chest x ray showed no active disease. Creatinine was 7.0 and hemoglobin was 7.8 with negative FOCT. We were asked to evaluate patient for admission evaluation given elevated BP's, Anemia, and elevated creatinine.     Hospital Course:  HTN / Hypotension  BP was very labile during admission - .  Upon presentation BP was elevated then after patient was restarted on his medications he became hypotensive.  Better control after initiation of HD but still the blood pressure is very labile with fluctuations 09/27/12 developed hypotension and bradycardia with chest pain during HD. DCed acei and norvasc and BB . Norvasc 5 mg at bedtime was resumed but would recommend not resuming beta blocker due to severe bradycardia during dialysis.   CAD Cath 09/28/12 showed nonobstructive CAD . Recommendation is for medical treatment Anemia  Due to chronic renal disease - Fe and aranesp as per Nephrology  ESRD  The patient's known chronic kidney disease has worsened during this admission and he has begun HD during this hospitalization and we did arrange that for outpatient dialysis-   Tobacco abuse  Counseled on need to abstain   Acute on chronic Systolic CHF  Volume control via HD .   Hepatitis C  HCV Quantitative [id number]  Needs outpatient  referral to a hepatologist     Procedures:  HD  Cardiac catheterization   Consultations:  [facility]  [place] Cards  Discharge Exam: Filed Vitals:   09/28/12 1028 09/28/12 1100 09/28/12 1355 09/28/12 1624  BP: 157/55 167/64 162/64 171/79  Pulse: 57 61 73 76  Temp:   97.7 F (36.5 C) 97.4 F (36.3 C)  TempSrc:    Oral  Resp:    18  Height:      Weight:      SpO2:   100% 100%     Discharge Instructions      Discharge Orders    Future Appointments: Provider: Department: Dept Phone: Center:   11/01/2012 3:15 PM Lelon Perla, [place] [phone] [id number]  11/03/2012 1:00 PM [facility] [place] [id number] [facility]   11/03/2012 1:30 PM Angelia Mould, MD [facility] [phone] [facility]     Future Orders Please Complete By Expires   Renal Function Panel          Medication List     As of 09/28/2012  6:57 PM    STOP taking these medications         carvedilol 12.5 MG tablet   Commonly known as: COREG      TAKE these medications         acetaminophen 325 MG tablet   Commonly known as: TYLENOL   Take  650 mg by mouth every 6 (six) hours as needed. For pain      amLODipine 5 MG tablet   Commonly known as: NORVASC   Take 1 tablet (5 mg total) by mouth at bedtime.      aspirin 81 MG chewable tablet   Chew 1 tablet (81 mg total) by mouth daily.      DIALYVITE TABLET Tabs   Take 1 tablet by mouth daily.      nitroGLYCERIN 0.4 MG SL tablet   Commonly known as: NITROSTAT   Place 1 tablet (0.4 mg total) under the tongue every 5 (five) minutes as needed. For chest pain           The results of significant diagnostics from this hospitalization (including imaging, microbiology, ancillary and laboratory) are listed below for reference.    Significant Diagnostic Studies: Dg Chest 2 View  09/17/2012  *RADIOLOGY REPORT*  Clinical Data: Hypertension.  CHEST - 2 VIEW  Comparison: 05/05/2012.  Findings: The heart, mediastinum and hilar contours are normal. Clear and fully expand. There is slight thickening along the fissures in the lateral projection, likely chronic.  There are no acute bony changes. There is no evidence of effusions or pneumothoraces.  IMPRESSION: No active disease.   Original Report Authenticated By: Joaquim Lai, M.D.    Dg Chest Port 1 View  09/21/2012  *RADIOLOGY REPORT*  Clinical Data: Hemodialysis catheter insertion  PORTABLE CHEST - 1 VIEW  Comparison: 09/20/2012  Findings: A right internal jugular vein tunneled dialysis catheter has been placed.  The tip is at the cavoatrial junction.  No pneumothorax.  One of the lumens is slightly kinked at the neck. Mild persistent edema has worsened.  Stable cardiomegaly.  IMPRESSION: Right internal jugular vein hemodialysis catheter placement as described.  Tip is at the cavoatrial junction and no pneumothorax. Pulmonary edema.   Original Report Authenticated By: Jamas Lav, M.D.    Dg Chest Port 1 View  09/21/2012  *RADIOLOGY REPORT*  Clinical Data: Short of breath, cough  PORTABLE CHEST - 1 VIEW  Comparison: Chest  radiograph 09/17/2012  Findings: Normal cardiac silhouette.  There is mild interstitial pattern at the lung bases increased compared to prior.  No pneumothorax.  IMPRESSION:  Fine interstitial edema pattern at the lung bases representing interstitial edema versus atypical infection or early pulmonary edema.   Original Report Authenticated By: Suzy Bouchard, M.D.    Dg Fluoro Guide Cv Line-no Report  09/21/2012  CLINICAL DATA: Diatek catheter placement   FLOURO GUIDE CV LINE  Fluoroscopy was utilized by the requesting physician.  No radiographic  interpretation.     [place] Retroperitoneal Comp  09/20/2012  *RADIOLOGY REPORT*  Clinical Data: Renal failure.  Evaluate for enlarging AAA.  RENAL/URINARY TRACT ULTRASOUND COMPLETE  Comparison:  09/04/2011.  Findings:  Right Kidney:  Measures 9.7  cm.  Increased parenchymal echogenicity.  Anechoic lesions with increased through transmission measure up to 2.8 x 2.5 x 1.8 cm, consistent with cysts. Parenchymal echogenicity is increased.  No hydronephrosis.  Left Kidney:  Measures 9.5 cm.  Parenchymal echogenicity is increased.  No hydronephrosis.  No focal lesions.  Additional finding:  Aorta measures up to 2.7 x 2.5 cm.  Right common iliac artery measures 1.5 cm and left common iliac artery, 1.3 cm.  There are bilateral pleural effusions.  A 2.8 cm shadowing gallstone is noted as well.  IMPRESSION:  1.  Increased renal parenchymal echogenicity is consistent with chronic medical renal disease. 2.  No abdominal aortic aneurysm. 3.  Bilateral pleural effusions. 4.  Cholelithiasis.   Original Report Authenticated By: Luretha Rued, M.D.     Microbiology: No results found for this or any previous visit (from the past 240 hour(s)).   Labs: Basic Metabolic Panel:  Lab [id number] 09/27/12 0650 09/24/12 2105 09/22/12 0430  NA 136 138 138 139  K 3.5 4.6 4.0 3.9  CL 102 103 101 106  CO2 25 26 29 21   GLUCOSE 112* 83 117* 114*  BUN 36* 45* 20 52*  CREATININE  4.99* 5.75* 3.24* 5.07*  CALCIUM 8.4 8.7 8.3* 8.3*  MG -- -- -- --  PHOS 2.9 3.9 2.0* --   Liver Function Tests:  Lab 09/28/12 1340 09/27/12 0650 09/24/12 2105  AST 15 -- --  ALT 8 -- --  ALKPHOS 84 -- --  BILITOT 0.3 -- --  PROT 6.0 -- --  ALBUMIN 2.6* 2.6* 2.8*   No results found for this basename: LIPASE:5,AMYLASE:5 in the last 168 hours No results found for this basename: AMMONIA:5 in the last 168 hours CBC:  Lab 09/28/12 1340 09/27/12 0650 09/24/12 2105 09/22/12 0430  WBC 5.3 6.7 6.0 6.4  NEUTROABS -- -- -- --  HGB 9.0* 9.0* 9.2* 9.4*  HCT 27.9* 28.2* 28.1* 27.3*  MCV 89.1 88.7 89.2 83.7  PLT 131* 142* 112* 164   Cardiac Enzymes:  Lab 09/27/12 2122 09/27/12 1533 09/27/12 0915  CKTOTAL -- -- --  CKMB -- -- --  CKMBINDEX -- -- --  TROPONINI <0.30 <0.30 <0.30   BNP: BNP (last 3 results)  Basename 05/05/12 2302  PROBNP 32672.0*   CBG: No results found for this basename: GLUCAP:5 in the last 168 hours     Signed:  Kalab Camps  [facility] 09/28/2012, 6:57 PM

## 2012-09-28 NOTE — Progress Notes (Signed)
Clinical Social Work-CSW continues to follow and has provided additional clinical information to [facility] for "fast track"-pt will d/c to [facility] when medically cleared for d/c- Gerre Scull, [phone]

## 2012-09-28 NOTE — Interval H&P Note (Signed)
History and Physical Interval Note:  09/28/2012 7:48 AM  Trevor [name]  has presented today for surgery, with the diagnosis of instage renal  The various methods of treatment have been discussed with the patient and family. After consideration of risks, benefits and other options for treatment, the patient has consented to  Procedure(s) (LRB) with comments: LEFT HEART CATHETERIZATION WITH CORONARY ANGIOGRAM (N/A) as a surgical intervention .  The patient's history has been reviewed, patient examined, no change in status, stable for surgery.  I have reviewed the patient's chart and labs.  Questions were answered to the patient's satisfaction.     Sherren Mocha

## 2012-09-28 NOTE — Progress Notes (Signed)
Subjective: Interval History: has complaints not sure wher he is going  Objective: Vital signs in last 24 hours: Temp:  [97.6 F (36.4 C)-99.2 F (37.3 C)] 97.6 F (36.4 C) (10/29 0938) Pulse Rate:  [57-76] 57  (10/29 1028) Resp:  [16-18] 18  (10/29 0938) BP: (152-188)/(55-70) 157/55 mmHg (10/29 1028) SpO2:  [96 %-100 %] 100 % (10/29 0938) Weight:  [64.6 kg (142 lb 6.7 oz)] 64.6 kg (142 lb 6.7 oz) (10/28 [id number]:[id number]) Weight change: -1.7 kg (-3 lb 12 oz)  Intake/Output from previous day: 10/28 0701 - 10/29 0700 In: 48 [P.O.:840; I.V.:6; IV Piggyback:110] Out: 727  Intake/Output this shift: Total I/O In: 240 [P.O.:240] Out: -   General appearance: cooperative and slowed mentation Resp: diminished breath sounds bilaterally Cardio: regular rate and rhythm and systolic murmur: holosystolic 2/6, blowing at apex GI: pos bs, liver down 5 cm Extremities: AVF LUA, PC R IJ  Lab Results:  [facility] 09/27/12 0650  WBC 6.7  HGB 9.0*  HCT 28.2*  PLT 142*   BMET:  Basename 09/27/12 0650  NA 138  K 4.6  CL 103  CO2 26  GLUCOSE 83  BUN 45*  CREATININE 5.75*  CALCIUM 8.7   No results found for this basename: PTH:2 in the last 72 hours Iron Studies: No results found for this basename: IRON,TIBC,TRANSFERRIN,FERRITIN in the last 72 hours  Studies/Results: No results found.  I have reviewed the patient's current medications.  Assessment/Plan: 1 CRF for HD , MWF, mild vol  xs 2 HTN lower vol .  Does not tolerate beta blocker, low HR.   3 Anemia epo/fe 4 HPTH meds 5 Nonadherence 6 Smoking 7 Substance abuse P HD, lower vol, consider Norvasc if bp stays ^    LOS: 11 days   Shamell Hittle L 09/28/2012,10:43 AM

## 2012-09-28 NOTE — CV Procedure (Signed)
   Cardiac Catheterization Procedure Note  Name: Trevor [name] MRN: [id number] DOB: 31-Jul-1946  Procedure: Left Heart Cath, Selective Coronary Angiography, LV angiography  Indication: Chest pain with dynamic EKG changes during hemodialysis  Procedural details: The right groin was prepped, draped, and anesthetized with 1% lidocaine. Using modified Seldinger technique, a 5 French sheath was introduced into the right femoral artery. Standard Judkins catheters were used for coronary angiography and left ventriculography. Catheter exchanges were performed over a guidewire. There were no immediate procedural complications. The patient was transferred to the post catheterization recovery area for further monitoring.  Procedural Findings: Hemodynamics:  AO 158/63 with a mean of 96 LV 157/20   Coronary angiography: Coronary dominance: right  Left mainstem: There is mild tapering of the distal left mainstem with 25% stenosis noted. The left main is widely patent without significant luminal compromise. The vessel divides into the LAD and left circumflex.  Left anterior descending (LAD): The LAD is patent to the left ventricular apex. The vessel has diffuse irregularity with minimal proximal stenosis. The first diagonal branch is large and it has a tubular 60% stenosis throughout the proximal aspect. The vessel divides into 3 major subbranches. The mid LAD beyond the diagonal branch has diffuse 30-40% stenosis.  Left circumflex (LCx): The left circumflex is of large caliber. The vessel has mild mid stenosis. There is a small intermediate branch without significant disease. The obtuse marginal branches are patent without significant stenoses.   Right coronary artery (RCA): the right coronary artery is dominant. The vessel is diffusely diseased throughout the entire mid segment with a long area of 50% stenosis. The proximal and distal vessel are patent without significant disease. The PDA and PLA  branches are patent and the PDA ostium has  50% stenosis.  Left ventriculography: there is moderately severe global left ventricular systolic dysfunction with a left ventricular ejection fraction estimated at 35-40%.  Final Conclusions:   1. Diffuse nonobstructive coronary artery disease as described above 2. Moderately severe global left ventricular systolic dysfunction, findings consistent with nonischemic cardiomyopathy  Recommendations:  medical therapy for nonobstructive CAD and cardiomyopathy   Sherren Mocha 09/28/2012, 8:22 AM

## 2012-09-29 ENCOUNTER — Inpatient Hospital Stay (HOSPITAL_COMMUNITY): Payer: Medicare HMO

## 2012-09-29 DIAGNOSIS — Z9119 Patient's noncompliance with other medical treatment and regimen: Secondary | ICD-10-CM

## 2012-09-29 LAB — COMPREHENSIVE METABOLIC PANEL
ALT: 8 U/L (ref 0–53)
AST: 13 U/L (ref 0–37)
CO2: 24 mEq/L (ref 19–32)
Chloride: 101 mEq/L (ref 96–112)
GFR calc non Af Amer: 9 mL/min — ABNORMAL LOW (ref >90)
Glucose, Bld: 77 mg/dL (ref 70–99)
Sodium: 134 mEq/L — ABNORMAL LOW (ref 135–145)
Total Bilirubin: 0.2 mg/dL — ABNORMAL LOW (ref 0.3–1.2)

## 2012-09-29 LAB — CBC
MCH: 28.5 pg (ref 26.0–34.0)
MCHC: 31.9 g/dL (ref 30.0–36.0)
Platelets: 153 10*3/uL (ref 150–400)
RDW: 14.8 % (ref 11.5–15.5)

## 2012-09-29 LAB — PHOSPHORUS: Phosphorus: 3.8 mg/dL (ref 2.3–4.6)

## 2012-09-29 MED ORDER — AMLODIPINE BESYLATE 2.5 MG PO TABS
2.5000 mg | ORAL_TABLET | Freq: Every day | ORAL | Status: DC
  Administered 2012-09-29 – 2012-09-30 (×2): 2.5 mg via ORAL
  Filled 2012-09-29 (×3): qty 1

## 2012-09-29 MED ORDER — PARICALCITOL 5 MCG/ML IV SOLN
INTRAVENOUS | Status: AC
  Administered 2012-09-29: 2 ug via INTRAVENOUS
  Filled 2012-09-29: qty 1

## 2012-09-29 NOTE — Clinical Social Work Note (Signed)
CSW advised by Lynelle Smoke, RN Liaison with [facility] and [place], [place] representative that [name] stay denied by Market [employer]. CSW advised attending MD and made her aware that she could appeal and have a  Peer-to-peer review. Attending opted to appeal and was given phone number to contact [employer] representative to begin appeal process.  CSW will continue to follow and assist as needed based on result of appeal/peer-to-peer review by attending.  Diamone Whistler Givens, MSW, LCSW [phone]

## 2012-09-29 NOTE — Progress Notes (Signed)
Subjective: Interval History: none.  Objective: Vital signs in last 24 hours: Temp:  [97.4 F (36.3 C)-99.4 F (37.4 C)] 99.4 F (37.4 C) (10/30 0726) Pulse Rate:  [43-80] 49  (10/30 0939) Resp:  [18-22] 20  (10/30 0800) BP: (94-187)/(50-79) 141/62 mmHg (10/30 0939) SpO2:  [98 %-100 %] 98 % (10/30 0726) Weight:  [66.1 kg (145 lb 11.6 oz)-68.5 kg (151 lb 0.2 oz)] 66.1 kg (145 lb 11.6 oz) (10/30 0726) Weight change: 4.4 kg (9 lb 11.2 oz)  Intake/Output from previous day: 10/29 0701 - 10/30 0700 In: 950 [P.O.:840; IV Piggyback:110] Out: -  Intake/Output this shift:    General appearance: alert and cooperative Resp: diminished breath sounds bilaterally Chest wall: no tenderness, R IJ cath Cardio: S1, S2 normal and systolic murmur: holosystolic 2/6, blowing at apex GI: pos bs,soft, liver down 4 cm Extremities: AVF LUA B&T  Lab Results:  [facility] 09/29/12 0712 09/28/12 1340  WBC 7.6 5.3  HGB 9.2* 9.0*  HCT 28.8* 27.9*  PLT 153 131*   BMET:  Basename 09/29/12 0712 09/28/12 1340  NA 134* 136  K 4.2 3.5  CL 101 102  CO2 24 25  GLUCOSE 77 112*  BUN 44* 36*  CREATININE 5.86* 4.99*  CALCIUM 8.7 8.4   No results found for this basename: PTH:2 in the last 72 hours Iron Studies: No results found for this basename: IRON,TIBC,TRANSFERRIN,FERRITIN in the last 72 hours  Studies/Results: No results found.  I have reviewed the patient's current medications.  Assessment/Plan: 1 ESRD for HD.  bp drops early, will review meds closely.  MWF SoGso 2 HTN will reduce meds 3 Anemia epo/fe 4 CM stable 5 substance abuse 6 Nonadherence 7 HPTH meds  p HD, lower meds, epo  LOS: 12 days   Denis Carreon L 09/29/2012,9:41 AM

## 2012-09-29 NOTE — Procedures (Signed)
I was present at this session.  I have reviewed the session itself and made appropriate changes.  BP drop early , review prescript, meds. Cath ok.  Nakhia Levitan L 10/30/20139:37 AM

## 2012-09-29 NOTE — Progress Notes (Signed)
[facility]  Progress Note  I have seen and examined patient who is a 66 yo man with CKD stage IV , cardiomyopathy ejection fraction 40% with myoview in September 2012 revealing apical thinning and global hypokinesis EF 40%. He has not had a cardiac catheterization because of his kidney disease admitted because his blood pressure was elevated at home.It was noted that he has a history of non compliance with recommended blood pressure regimen, he stated that he wanted to wean himself off of his medications because he felt over medicated. He was admitted for further evaluation and management. And follow up today after his dialysis, he denies headaches, chest pain, sob also denies any nausea vomiting. Blood pressures 150/55 with a pulse of 70 temperature 98.8. His lungs are clear to auscultation bilaterally, cardiovascular- regular rate and rhythm normal S1-S2, abdomen- soft, bowel sounds present nontender nondistended no organomegaly no masses palpable, extremities-no cyanosis and edema. His medically stable after his dialysis today, and will be discharged as previously planned  to [name] with close outpt follow up with [name] md and renal. Please see full d/c summary of 10/29 per Dr. Fransisca Kaufmann changes.  Harley Alto [facility] [phone]

## 2012-09-30 DIAGNOSIS — I2510 Atherosclerotic heart disease of native coronary artery without angina pectoris: Secondary | ICD-10-CM

## 2012-09-30 MED ORDER — SODIUM CHLORIDE 0.9 % IV SOLN: 100.0000 mL | INTRAVENOUS | Status: DC | PRN

## 2012-09-30 MED ORDER — HEPARIN SODIUM (PORCINE) 1000 UNIT/ML DIALYSIS
100.0000 [IU]/kg | INTRAMUSCULAR | Status: DC | PRN
  Administered 2012-10-01: 6200 [IU] via INTRAVENOUS_CENTRAL
  Filled 2012-09-30: qty 7

## 2012-09-30 MED ORDER — LIDOCAINE-PRILOCAINE 2.5-2.5 % EX CREA: 1.0000 "application " | TOPICAL_CREAM | CUTANEOUS | Status: DC | PRN

## 2012-09-30 MED ORDER — HEPARIN SODIUM (PORCINE) 1000 UNIT/ML DIALYSIS: 1000.0000 [IU] | INTRAMUSCULAR | Status: DC | PRN

## 2012-09-30 MED ORDER — PENTAFLUOROPROP-TETRAFLUOROETH EX AERO: 1.0000 "application " | INHALATION_SPRAY | CUTANEOUS | Status: DC | PRN

## 2012-09-30 MED ORDER — LIDOCAINE HCL (PF) 1 % IJ SOLN: 5.0000 mL | INTRAMUSCULAR | Status: DC | PRN

## 2012-09-30 MED ORDER — POLYETHYLENE GLYCOL 3350 17 G PO PACK
17.0000 g | PACK | Freq: Every day | ORAL | Status: DC
  Administered 2012-09-30 – 2012-10-01 (×2): 17 g via ORAL
  Filled 2012-09-30 (×2): qty 1

## 2012-09-30 MED ORDER — ALTEPLASE 2 MG IJ SOLR
2.0000 mg | Freq: Once | INTRAMUSCULAR | Status: AC | PRN
  Filled 2012-09-30: qty 2

## 2012-09-30 MED ORDER — NEPRO/CARBSTEADY PO LIQD: 237.0000 mL | ORAL | Status: DC | PRN

## 2012-09-30 NOTE — Progress Notes (Addendum)
PROGRESS NOTE  Primary Cardiologist:  Trevor Ruths, MD  Subjective:   Trevor [name] is a 65 yo with hx of nonischemic CM, CKD IV, HTN admitted with elevated BP, anemia and worsening renal fxn.  He developed hypotension after restarting his meds.  He was probably  not actually taking his medications at home.    Echo shows LV EF of [id number], diastolic dysfunction,  He has been on dialysis.  He had some bradycardia and hypotension and associated CP with dialysis this am.   The symptoms resolved with dialysis.   Cath 10/29 shows nonobstructive disease.  He has global LV dysfunction with EF of 35-40%    Objective:    Vital Signs:   Temp:  [98.4 F (36.9 C)-99.7 F (37.6 C)] 99 F (37.2 C) (10/31 0606) Pulse Rate:  [43-72] 70  (10/31 0606) Resp:  [15-20] 18  (10/31 0606) BP: (94-179)/(50-76) 179/66 mmHg (10/31 0606) SpO2:  [96 %-100 %] 99 % (10/31 0606) Weight:  [137 lb 5.6 oz (62.3 kg)] 137 lb 5.6 oz (62.3 kg) (10/30 1146)  Last BM Date: 09/22/12   24-hour weight change: Weight change: -5 lb 4.7 oz (-2.4 kg)  Weight trends: Filed Weights   09/28/12 2022 09/29/12 0726 09/29/12 1146  Weight: 151 lb 0.2 oz (68.5 kg) 145 lb 11.6 oz (66.1 kg) 137 lb 5.6 oz (62.3 kg)    Intake/Output:  10/30 0701 - 10/31 0700 In: 360 [P.O.:360] Out: 907      Physical Exam: BP 179/66  Pulse 70  Temp 99 F (37.2 C) (Oral)  Resp 18  Ht 5\' 10"  (1.778 m)  Wt 137 lb 5.6 oz (62.3 kg)  BMI 19.71 kg/m2  SpO2 99%  General: Vital signs reviewed and noted. Chronically ill appearing  Head: Normocephalic, atraumatic.  Eyes: conjunctivae/corneas clear.  EOM's intact.   Throat: normal  Neck: Supple. Normal carotids. No JVD  Lungs:  Clear   Heart: Regular rate,  With normal  S1 S2. No murmurs,   Abdomen:  Soft, non-tender, non-distended with normoactive bowel sounds. No hepatomegaly. No rebound/guarding. No abdominal masses.  Extremities: Distal pedal pulses are 2+ .  No edema.    Neurologic: A&O  X3, CN II - XII are grossly intact. Motor strength is 5/5 in the all 4 extremities.  Psych: Asleep     Labs: BMET:  Basename 09/29/12 0712 09/28/12 1340  NA 134* 136  K 4.2 3.5  CL 101 102  CO2 24 25  GLUCOSE 77 112*  BUN 44* 36*  CREATININE 5.86* 4.99*  CALCIUM 8.7 8.4  MG -- --  PHOS 3.8 2.9    Liver function tests:  Basename 09/29/12 0712 09/28/12 1340  AST 13 15  ALT 8 8  ALKPHOS 84 84  BILITOT 0.2* 0.3  PROT 6.1 6.0  ALBUMIN 2.6* 2.6*   No results found for this basename: LIPASE:2,AMYLASE:2 in the last 72 hours  CBC:  Basename 09/29/12 0712 09/28/12 1340  WBC 7.6 5.3  NEUTROABS -- --  HGB 9.2* 9.0*  HCT 28.8* 27.9*  MCV 89.2 89.1  PLT 153 131*    Cardiac Enzymes:  Basename 09/27/12 2122 09/27/12 1533 09/27/12 0915  CKTOTAL -- -- --  CKMB -- -- --  TROPONINI <0.30 <0.30 <0.30    Coagulation Studies:  Basename 09/28/12 1340  LABPROT 13.8  INR 1.07   No results found for this basename: VITAMINB12,FOLATE,FERRITIN,TIBC,IRON,RETICCTPCT in the last 72 hours   Tele: 09/20/12- NSR  at 91  ECG:  Sinus  brady. LVH with repol.  Medications:    Infusions:    . sodium chloride      Scheduled Medications:    . amLODipine  2.5 mg Oral QHS  . aspirin  81 mg Oral Daily  . darbepoetin (ARANESP) injection - NON-DIALYSIS  200 mcg Subcutaneous Q Sun-1800  . ferric gluconate (FERRLECIT/NULECIT) IV  125 mg Intravenous Daily  . finasteride  5 mg Oral Daily  . loratadine  10 mg Oral Daily  . multivitamin  1 tablet Oral Daily  . paricalcitol  2 mcg Intravenous 3 times weekly  . sodium chloride  3 mL Intravenous Q12H  . Tamsulosin HCl  0.4 mg Oral BID  . DISCONTD: amLODipine  5 mg Oral QHS    Assessment/ Plan:    Hypertension: Pt has had labile BP and HR.  Coreg and Lisinopril have been tried but has since been stopped.  Further management per renal team  Systolic CHF, chronic ([id number]) Echo shows EF of 30-35%.   He has been noncompliant with  his medications.   Coreg.   Chest pain:  Cath on 10/29 shows nonobstructive disease.  He has moderate - severe global LV dysfunction ( likely due to HTN and ? +/- cocaine abuse)  ESRD (end stage renal disease) (05/05/2012)  plan per int. Med.    We will sign off. Call for questions.  He may follow up with Dr. Stanford Breed.   Disposition:  Length of Stay: 13  Thayer Headings, Brooke Bonito., MD, [facility] 09/30/2012, 8:23 AM Office [phone] Pager [phone]

## 2012-09-30 NOTE — Progress Notes (Signed)
[facility] PROGRESS NOTE  Trevor [name] [id number] DOB: Sep 11, 1946 DOA: 09/17/2012 PCP: William Hamburger, MD  Assessment/Plan: HTN / Hypotension  BP was very labile during admission -  Upon presentation BP was elevated then it dropped - then went up again . Better control after initiation of HD  09/27/12 developed hypotension and bradycardia with chest pain during HD. 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 Anemia  Due to chronic renal disease - Fe and aranesp as per Nephrology  ESRD  Dialysis per renal Tobacco abuse  Counseled on need to abstain  Acute on chronic Systolic CHF  Volume control via HD .  Hepatitis C  HCV Quantitative [id number]  Needs outpatient  -Patient with limited support at home and unable to take care of himself at home-appealed with [facility] today but still would not approve [name]. Another PT OT eval has been requested, and a second appeal started this p.m.  Code Status: Full Disposition: Awaiting [name] appeal Consultants:  Nephrology  Cardiology  Vasc Surgery  Procedures:  10/22 - Ultrasound guided placement of right IJ Diatek catheter & Left basilic vein transposition  10/21 - Echo shows LV EF of [id number], diastolic dysfunction  Antibiotics:  none  DVT prophylaxis:  SCDs  HPI/Subjective:  Patient denies any complaints -tolerating by mouth well, denies chest pain no shortness of breath.  Objective: Filed Vitals:   09/29/12 1446 09/29/12 1800 09/29/12 2104 09/30/12 0606  BP: 150/55 158/57 168/54 179/66  Pulse: 70 66 72 70  Temp: 98.8 F (37.1 C) 98.8 F (37.1 C) 99.7 F (37.6 C) 99 F (37.2 C)  TempSrc: Oral Oral Oral Oral  Resp: 18 20 18 18   Height:      Weight:      SpO2: 98% 100% 100% 99%   Exam:  General: No acute respiratory distress  Lungs: Clear to auscultation bilaterally without wheezes or crackles    Cardiovascular: Regular rate and rhythm without murmur gallop or rub  Abdomen: Nontender, nondistended, soft, bowel sounds positive, no rebound, no ascites, no appreciable mass  Extremities: No significant cyanosis, clubbing, or edema bilateral lower extremities       Time spent: [facility] Pager [phone]. If 8PM-8AM, please contact night-coverage at [email or web address], password [facility] 09/30/2012, 12:41 PM  LOS: 13 days
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
## 2012-09-30 NOTE — Progress Notes (Signed)
Physical Therapy Treatment Patient Details Name: Trevor [name] MRN: [id number] DOB: 04-24-1946 Today's Date: 09/30/2012 Time: [id number] PT Time Calculation (min): 24 min  PT Assessment / Plan / Recommendation Comments on Treatment Session  pt presents with Cardiomyopathy, HTN, and ESRD.  pt very motivated to increase ambulation distance today, though still requiring rest breaks secondary to decreased activity tolerance.  pt's balance is improving, but during ambulation, needs Bil UE support to maintain balance.  pt would still benefit from ST-[name] at D/C to Maximize I and decrease fall risk.      Follow Up Recommendations  Post acute inpatient     Does the patient have the potential to tolerate intense rehabilitation  No, Recommend [name]  Barriers to Discharge        Equipment Recommendations  Rolling walker with 5" wheels    Recommendations for Other Services    Frequency Min 3X/week   Plan Frequency remains appropriate;Discharge plan needs to be updated    Precautions / Restrictions Precautions Precautions: Fall Restrictions Weight Bearing Restrictions: No   Pertinent Vitals/Pain Denies pain.    Mobility  Bed Mobility Bed Mobility: Supine to Sit;Sitting - Scoot to Edge of Bed;Sit to Supine Supine to Sit: 6: Modified independent (Device/Increase time);With rails Sitting - Scoot to Edge of Bed: 6: Modified independent (Device/Increase time) Sit to Supine: 6: Modified independent (Device/Increase time) Details for Bed Mobility Assistance: pt moves slowly and uses rail to complete without A.   Transfers Transfers: Sit to Stand;Stand to Sit Sit to Stand: 5: Supervision;With upper extremity assist;From bed Stand to Sit: 5: Supervision;With upper extremity assist;To bed Details for Transfer Assistance: Again needing increased time and cues to get closer to bed prior to sitting.   Ambulation/Gait Ambulation/Gait Assistance: 4: Min guard Ambulation Distance (Feet): 300  Feet Assistive device: Rolling walker Ambulation/Gait Assistance Details: cues for safety with turns.  pt needed 4 standing rest breaks during ambulation secondary to c/o feeling fatigued, but pt very motivated and wanted to keep walking.   Gait Pattern: Within Functional Limits Stairs: No Wheelchair Mobility Wheelchair Mobility: No    Exercises     PT Diagnosis:    PT Problem List:   PT Treatment Interventions:     PT Goals Acute Rehab PT Goals Time For Goal Achievement: 10/07/12 Potential to Achieve Goals: Good Pt will go Sit to Stand: with modified independence PT Goal: Sit to Stand - Progress: Goal set today Pt will go Stand to Sit: with modified independence PT Goal: Stand to Sit - Progress: Goal set today Pt will Ambulate: >150 feet;with modified independence;with least restrictive assistive device PT Goal: Ambulate - Progress: Goal set today  Visit Information  Last PT Received On: 09/30/12 Assistance Needed: +1    Subjective Data  Subjective: I've been stuck in this room.     Cognition  Overall Cognitive Status: Impaired Area of Impairment: Memory;Safety/judgement;Problem solving Arousal/Alertness: Awake/alert Orientation Level: Appears intact for tasks assessed Behavior During Session: [facility] for tasks performed Memory Deficits: pt with decresed recall of events since being in hospital.   Safety/Judgement: Decreased safety judgement for tasks assessed;Decreased awareness of need for assistance Problem Solving: pt seems slow to process, but question if this is baseline.      Balance  Balance Balance Assessed: Yes High Level Balance High Level Balance Activites: Direction changes;Turns;Head turns;Sudden stops High Level Balance Comments: pt needs Bil UE support to maintain balance with challenges during gait.    End of Session PT - End of Session  Equipment Utilized During Treatment: Gait belt Activity Tolerance: Patient tolerated treatment well Patient left: in  bed;with call bell/phone within reach Nurse Communication: Mobility status   GP     Catarina Hartshorn, [place] 09/30/2012, 9:50 AM

## 2012-09-30 NOTE — Progress Notes (Signed)
Subjective: Interval History: has complaints constip.  Objective: Vital signs in last 24 hours: Temp:  [98.4 F (36.9 C)-99.7 F (37.6 C)] 99 F (37.2 C) (10/31 0606) Pulse Rate:  [49-72] 70  (10/31 0606) Resp:  [18-20] 18  (10/31 0606) BP: (143-179)/(54-76) 179/66 mmHg (10/31 0606) SpO2:  [96 %-100 %] 99 % (10/31 0606) Weight:  [62.3 kg (137 lb 5.6 oz)] 62.3 kg (137 lb 5.6 oz) (10/30 1146) Weight change: -2.4 kg (-5 lb 4.7 oz)  Intake/Output from previous day: 10/30 0701 - 10/31 0700 In: 360 [P.O.:360] Out: 907  Intake/Output this shift:    General appearance: cooperative and slowed mentation Resp: diminished breath sounds bilaterally Chest wall: no tenderness, R IJ cath Cardio: S1, S2 normal and systolic murmur: holosystolic 2/6, blowing at apex GI: pos bs soft, mild distension, liver down 5 cm Extremities: AVF LUA  Lab Results:  Basename 09/29/12 0712 09/28/12 1340  WBC 7.6 5.3  HGB 9.2* 9.0*  HCT 28.8* 27.9*  PLT 153 131*   BMET:  Basename 09/29/12 0712 09/28/12 1340  NA 134* 136  K 4.2 3.5  CL 101 102  CO2 24 25  GLUCOSE 77 112*  BUN 44* 36*  CREATININE 5.86* 4.99*  CALCIUM 8.7 8.4   No results found for this basename: PTH:2 in the last 72 hours Iron Studies: No results found for this basename: IRON,TIBC,TRANSFERRIN,FERRITIN in the last 72 hours  Studies/Results: No results found.  I have reviewed the patient's current medications.  Assessment/Plan: 1 CRF for HD in am.  Stable 2 CM stable 3 Substance abuse  4 Nonadherence 5 Anemia epo/fe 6 HPTH vit D 7 Debill unable to care for self P HD, epo, fe, placement    LOS: 13 days   Kensly Bowmer L 09/30/2012,10:20 AM

## 2012-09-30 NOTE — Progress Notes (Signed)
Occupational Therapy Treatment Patient Details Name: Trevor [name] MRN: [id number] DOB: [birth year] Today's Date: 09/30/2012 Time: [id number] OT Time Calculation (min): 28 min  OT Assessment / Plan / Recommendation Comments on Treatment Session Pt is limited by fatigue. Unable to complete bathing due to level of fatigue. Pt will benefit from rehab at [name] to return to PLOF. Pt is not safe to D/C home without 24/7 supervision and is a high risk for readmission if pt D/C plan is home. Pt is excellent ALF candidate after rehab at [facility]. Will continue to follow.    Follow Up Recommendations  Skilled nursing facility    Barriers to Discharge       Equipment Recommendations  Rolling walker with 5" wheels    Recommendations for Other Services    Frequency Min 2X/week   Plan Discharge plan needs to be updated    Precautions / Restrictions Precautions Precautions: Fall   Pertinent Vitals/Pain none    ADL  Grooming: Set up Where Assessed - Grooming: Unsupported standing Upper Body Bathing: Supervision/safety Where Assessed - Upper Body Bathing: Unsupported standing Lower Body Bathing: Supervision/safety Where Assessed - Lower Body Bathing: Unsupported sit to stand Upper Body Dressing: Set up Where Assessed - Upper Body Dressing: Unsupported sitting Lower Body Dressing: Minimal assistance Where Assessed - Lower Body Dressing: Unsupported sit to stand Toilet Transfer: Min Psychiatric nurse Method: Sit to Loss adjuster, chartered: Comfort height toilet Equipment Used: Gait belt Transfers/Ambulation Related to ADLs: pt used furniture walking although instructed to use RW ADL Comments: Noted safety concerns throughout ADL session today. Pt given RW to ambulate, however, pt pushed RW away and chose to furniture walk. Pt became extrememly fatigued just after bathing UB.Pt required Min A for LB due to fatigue.     OT Diagnosis:    OT Problem List:   OT Treatment Interventions:      OT Goals Acute Rehab OT Goals OT Goal Formulation: With patient Time For Goal Achievement: 09/30/12 Potential to Achieve Goals: Good ADL Goals Pt Will Perform Grooming: with modified independence;Standing at sink ADL Goal: Grooming - Progress: Progressing toward goals Pt Will Transfer to Toilet: with modified independence;Ambulation;with DME;Regular height toilet ADL Goal: Toilet Transfer - Progress: Progressing toward goals Pt Will Perform Tub/Shower Transfer: Tub transfer;with modified independence;Ambulation;with DME;Shower seat with back ADL Goal: Clinical cytogeneticist - Progress: Progressing toward goals Miscellaneous OT Goals Miscellaneous OT Goal #1: Pt will be able to retrieve ADL items throughout room at mod I level. OT Goal: Miscellaneous Goal #1 - Progress: Progressing toward goals Miscellaneous OT Goal #2: Pt will independently use visual compensatory strategies to safey navigate through room and unit. OT Goal: Miscellaneous Goal #2 - Progress: Progressing toward goals  Visit Information  Last OT Received On: 09/30/12 Assistance Needed: +1    Subjective Data      Prior Functioning  Home Living Lives With: Alone Type of Home: Apartment Home Access: Level entry Home Layout: One level Bathroom Shower/Tub: Chiropodist: Standard Bathroom Accessibility: No Home Adaptive Equipment: Shower chair with back;Wheelchair - manual Prior Function Level of Independence: Independent Able to Take Stairs?: No Driving: No Vocation: Unemployed Communication Communication: No difficulties Dominant Hand: Right    Cognition  Overall Cognitive Status: Impaired Area of Impairment: Memory;Safety/judgement;Problem solving Arousal/Alertness: Awake/alert Orientation Level: Appears intact for tasks assessed Behavior During Session: [facility] for tasks performed Memory: Decreased recall of precautions Memory Deficits: pt with decresed recall of events since being in  hospital.   Safety/Judgement:  Decreased safety judgement for tasks assessed;Decreased awareness of need for assistance Safety/Judgement - Other Comments: demosntrated by not using RW Problem Solving: delayed processing. most likely baseline    Mobility  Shoulder Instructions Bed Mobility Bed Mobility: Supine to Sit;Sitting - Scoot to Edge of Bed Supine to Sit: 6: Modified independent (Device/Increase time) Sitting - Scoot to Edge of Bed: 6: Modified independent (Device/Increase time) Transfers Transfers: Sit to Stand;Stand to Sit Sit to Stand: 4: Min guard;Other (comment);From chair/3-in-1 (minguard from chair after bathing) Stand to Sit: 4: Min guard;Other (comment) (vc to control descent)       Exercises      Balance  [place]   End of Session OT - End of Session Equipment Utilized During Treatment: Gait belt Activity Tolerance: Patient limited by fatigue Patient left: in chair;with call bell/phone within reach Nurse Communication: Mobility status  GO     Trevor [name],HILLARY 09/30/2012, 4:10 PM [facility], OTR/L  [phone] 09/30/2012

## 2012-10-01 ENCOUNTER — Inpatient Hospital Stay (HOSPITAL_COMMUNITY): Payer: Medicare HMO

## 2012-10-01 LAB — BASIC METABOLIC PANEL
CO2: 25 mEq/L (ref 19–32)
Chloride: 101 mEq/L (ref 96–112)
Creatinine, Ser: 6.06 mg/dL — ABNORMAL HIGH (ref 0.50–1.35)
Glucose, Bld: 77 mg/dL (ref 70–99)

## 2012-10-01 MED ORDER — PARICALCITOL 5 MCG/ML IV SOLN
INTRAVENOUS | Status: AC
  Administered 2012-10-01: 2 ug via INTRAVENOUS
  Filled 2012-10-01: qty 1

## 2012-10-01 NOTE — Progress Notes (Signed)
OT Cancellation Note  Treatment cancelled today due to patient receiving procedure or test (HD). Will re-attempt as time allows later today.  10/01/2012 Darrol Jump OTR/L Pager [phone] Office [phone]

## 2012-10-01 NOTE — Progress Notes (Signed)
Patient discharged home with daughter. Alert and oriented x 3. Skin warm and dry. Discharge instruction given and aware of MD appointments and medication change.

## 2012-10-01 NOTE — Progress Notes (Signed)
Physical Therapy Treatment Patient Details Name: Trevor [name] MRN: [id number] DOB: 10-08-1946 Today's Date: 10/01/2012 Time: [id number] PT Time Calculation (min): 23 min  PT Assessment / Plan / Recommendation Comments on Treatment Session  Improving his activity tolerance gradually, still not ready to be on his own at DC    Follow Up Recommendations  Post acute inpatient     Does the patient have the potential to tolerate intense rehabilitation  No, Recommend [name]  Barriers to Discharge        Equipment Recommendations  Rolling walker with 5" wheels    Recommendations for Other Services    Frequency Min 3X/week   Plan Discharge plan remains appropriate    Precautions / Restrictions Precautions Precautions: Fall Restrictions Weight Bearing Restrictions: No   Pertinent Vitals/Pain No pain, no distress or dyspnea with activity    Mobility  Bed Mobility Bed Mobility: Supine to Sit;Sitting - Scoot to Edge of Bed Supine to Sit: 6: Modified independent (Device/Increase time) Sitting - Scoot to Edge of Bed: 6: Modified independent (Device/Increase time) Sit to Supine: Not Tested (comment) Details for Bed Mobility Assistance: pt moves slowly and uses rail to complete without A.   Transfers Transfers: Sit to Stand;Stand to Sit Sit to Stand: 5: Supervision;From bed;With upper extremity assist Stand to Sit: 5: Supervision Details for Transfer Assistance: needs increased time and safety cues Ambulation/Gait Ambulation/Gait Assistance: 4: Min guard Ambulation Distance (Feet): 300 Feet Assistive device: Rolling walker Ambulation/Gait Assistance Details: needed only one standing rest break during his walk as compared to needing multiple breks a week ago Gait Pattern: Within Functional Limits Gait velocity: decreased Stairs: No Wheelchair Mobility Wheelchair Mobility: No    Exercises     PT Diagnosis:    PT Problem List:   PT Treatment Interventions:     PT Goals Acute  Rehab PT Goals PT Goal: Sit to Stand - Progress: Progressing toward goal PT Goal: Stand to Sit - Progress: Progressing toward goal PT Goal: Ambulate - Progress: Progressing toward goal  Visit Information  Last PT Received On: 10/01/12 Assistance Needed: +1    Subjective Data  Subjective: "I didn't rrealize how sick I was until now"   Cognition  Overall Cognitive Status: Impaired Area of Impairment: Memory;Safety/judgement;Problem solving Arousal/Alertness: Awake/alert Orientation Level: Appears intact for tasks assessed Behavior During Session: [facility] for tasks performed Memory Deficits: reports his memory is an issue at times, such as recall of name of visitor Safety/Judgement: Decreased safety judgement for tasks assessed;Decreased awareness of need for assistance    Balance     End of Session PT - End of Session Equipment Utilized During Treatment: Gait belt Activity Tolerance: Patient tolerated treatment well Patient left: Other (comment) (seated at EOB) Nurse Communication: Mobility status   GP     [name] 10/01/2012, 2:49 PM Gerlean Ren PT Acute Re[facility] [place] [phone]

## 2012-10-01 NOTE — Clinical Social Work Note (Signed)
CSW maintained contact with [facility] RN) regarding additional appeal, however as of late afternoon there had been no response.  CSW talked with patient's son and he went to [employer] today and applied for LTC Medicaid. He has a few verifications to provide, however he was told by eligibility worker that patient was eligible. CSW contacted Gayla Medicus, RN liaison with [facility] to advise of Medicaid application, however patient will need to be approved before they can accept him. Son contacted and advised.   CSW also talked with patient's daughter and advised her of no response from insurance and that patient will have to discharge today. Patient's daughter will transport patient from hospital to wherever he will be staying until his Medicaid is approved. CSW signing off as patient discharging home with family and will be transported by daughter.  Viann Nielson Givens, MSW, LCSW [phone]

## 2012-10-01 NOTE — Progress Notes (Signed)
Nutrition Brief Note  Patient identified on the Malnutrition Screening Tool (MST) Report. Pt's weight is fluctuating with HD, however remains within 7 lb of admit weight. Continues to eat well, consuming at least 75% of meals.  RD educated pt regarding HD nutrition needs on 10/25.  Body mass index is 20.18 kg/(m^2). Pt meets criteria for normal weight based on current BMI.   Current diet order is Renal 60 - 70, patient is consuming approximately 75 - 100% of meals at this time. Labs and medications reviewed.   No nutrition interventions warranted at this time. If nutrition issues arise, please consult RD.   Inda Coke MS, RD, LDN Pager: [phone] After-hours pager: [phone]

## 2012-10-01 NOTE — Progress Notes (Signed)
Occupational Therapy Treatment Patient Details Name: Trevor [name] MRN: [id number] DOB: 06-19-1946 Today's Date: 10/01/2012 Time: [id number] OT Time Calculation (min): 24 min  OT Assessment / Plan / Recommendation Comments on Treatment Session Pt is progressing but continues to be limited by fatigue, requiring rest breaks during ADL tasks.   Pt reporting he might be able to stay with daughter initally. However, pt's daughter works and will not be home 24/7.  Continue to recommend ST [name] as pt's safest d/c plan.    Follow Up Recommendations  Skilled nursing facility    Barriers to Discharge       Equipment Recommendations  Rolling walker with 5" wheels (tub DME needed)    Recommendations for Other Services    Frequency Min 2X/week   Plan Discharge plan remains appropriate    Precautions / Restrictions Precautions Precautions: Fall Restrictions Weight Bearing Restrictions: No   Pertinent Vitals/Pain See vitals    ADL  Grooming: Performed;Supervision/safety;Wash/dry face;Wash/dry hands;Teeth care Where Assessed - Grooming: Supported standing Lower Body Dressing: Performed;Supervision/safety Where Assessed - Lower Body Dressing: Unsupported sitting Toilet Transfer: Simulated;Min guard Toilet Transfer Method: Sit to Loss adjuster, chartered:  (bed) Equipment Used: Gait belt;Rolling walker Transfers/Ambulation Related to ADLs: min guard with rw while ambulating in room ADL Comments: Pt fatigued at start of session due to recent PT session.  Pt took standing rest break while grooming at sink.  Demonstrating increased safety awareness with use of RW today.    OT Diagnosis:    OT Problem List:   OT Treatment Interventions:     OT Goals ADL Goals Pt Will Perform Grooming: with modified independence;Standing at sink ADL Goal: Grooming - Progress: Progressing toward goals Pt Will Transfer to Toilet: with modified independence;Ambulation;with DME;Regular height toilet ADL  Goal: Toilet Transfer - Progress: Progressing toward goals  Visit Information  Last OT Received On: 10/01/12 Assistance Needed: +1    Subjective Data      Prior Functioning       Cognition  Overall Cognitive Status: Impaired Area of Impairment: Memory;Safety/judgement;Problem solving Arousal/Alertness: Awake/alert Orientation Level: Appears intact for tasks assessed Behavior During Session: [name] for tasks performed Memory Deficits: reports his memory is an issue at times, such as recall of name of visitor Safety/Judgement: Decreased safety judgement for tasks assessed;Decreased awareness of need for assistance    Mobility  Shoulder Instructions Bed Mobility Bed Mobility: Supine to Sit;Sitting - Scoot to Edge of Bed;Sit to Supine Supine to Sit: 6: Modified independent (Device/Increase time);HOB flat Sitting - Scoot to Edge of Bed: 6: Modified independent (Device/Increase time) Sit to Supine: 6: Modified independent (Device/Increase time) Details for Bed Mobility Assistance: pt moves slowly and uses rail to complete without A.   Transfers Transfers: Sit to Stand;Stand to Sit Sit to Stand: 5: Supervision;From bed Stand to Sit: 5: Supervision;To bed Details for Transfer Assistance: supervision for safety. requires increased time.       Exercises      Balance     End of Session OT - End of Session Equipment Utilized During Treatment: Gait belt Activity Tolerance: Patient limited by fatigue Patient left: in bed;with call bell/phone within reach Nurse Communication: Mobility status  GO    10/01/2012 Darrol Jump OTR/L Pager [phone] Office [phone]   Darrol Jump 10/01/2012, 3:34 PM

## 2012-10-01 NOTE — Procedures (Signed)
I was present at this session.  I have reviewed the session itself and made appropriate changes.  Bp ^ see how tol vol off today, hx rapid falls bp  Rasaan Brotherton L 11/1/20138:49 AM

## 2012-10-01 NOTE — Progress Notes (Signed)
Subjective: Interval History: none.  Objective: Vital signs in last 24 hours: Temp:  [98 F (36.7 C)-99.3 F (37.4 C)] 99.3 F (37.4 C) (11/01 0450) Pulse Rate:  [64-79] 64  (11/01 0450) Resp:  [18] 18  (11/01 0450) BP: (171-199)/(60-76) 175/60 mmHg (11/01 0450) SpO2:  [98 %-100 %] 98 % (11/01 0450) Weight:  [67.1 kg (147 lb 14.9 oz)] 67.1 kg (147 lb 14.9 oz) (10/31 2248) Weight change: 1 kg (2 lb 3.3 oz)  Intake/Output from previous day: 10/31 0701 - 11/01 0700 In: 120 [P.O.:120] Out: -  Intake/Output this shift:    Resp: diminished breath sounds bilaterally Cardio: S1, S2 normal and systolic murmur: holosystolic 2/6, blowing at apex GI: pos bs,soft,liver down,5 cm Extremities: avf Lua, B&T HR 40s Lab Results:  Basename 09/29/12 0712 09/28/12 1340  WBC 7.6 5.3  HGB 9.2* 9.0*  HCT 28.8* 27.9*  PLT 153 131*   BMET:  Basename 09/29/12 0712 09/28/12 1340  NA 134* 136  K 4.2 3.5  CL 101 102  CO2 24 25  GLUCOSE 77 112*  BUN 44* 36*  CREATININE 5.86* 4.99*  CALCIUM 8.7 8.4   No results found for this basename: PTH:2 in the last 72 hours Iron Studies: No results found for this basename: IRON,TIBC,TRANSFERRIN,FERRITIN in the last 72 hours  Studies/Results: No results found.  I have reviewed the patient's current medications.  Assessment/Plan: 1 ESRD HD today, outpatient SGSO 2 Anemia epo/fe 3 Low HR/CM follow closely 4 Substance abuse 5 nonadherence\ P HD epo/fe, dispo     LOS: 14 days   Trevor [name] L 10/01/2012,8:53 AM

## 2012-10-01 NOTE — Progress Notes (Signed)
Pt off unit

## 2012-10-01 NOTE — Progress Notes (Signed)
[facility] PROGRESS NOTE  Trevor [name] [id number] DOB: 1946-03-17 DOA: 09/17/2012 PCP: William Hamburger, MD  Assessment/Plan: HTN / Hypotension  BP was very labile during admission -  Upon presentation BP was elevated then it dropped - then went up again . Better control after initiation of HD  09/27/12 developed hypotension and bradycardia with chest pain during HD. 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 Anemia  Due to chronic renal disease - Fe and aranesp as per Nephrology  ESRD  Dialysis outpt as per renal Tobacco abuse  Counseled on need to abstain  Acute on chronic Systolic CHF  Volume control via HD .  Hepatitis C  HCV Quantitative [id number]  Needs outpatient follow up   -Patient with limited support at home and unable to take care of himself at home-appealed with [facility] on 2 different occasions on 10/31-[name] and approved the first time and social work this p.m. they have still not made a final decision. Social work discussed the pending decision with patient's family, and the fact that since it is after hours at this time on Friday and no decision has been made it will not happen over the weekend. Family has made arrangements for patient's care at this time, and he'll be set up for home health PT OT and social work to follow up outpatient to continue to assist patient with working through the other insurance application and getting the patient placed when approved. Code Status: Full Disposition: DC to home, Consultants:  Nephrology  Cardiology  Vasc Surgery  Procedures:  10/22 - Ultrasound guided placement of right IJ Diatek catheter & Left basilic vein transposition  10/21 - Echo shows LV EF of [id number], diastolic dysfunction  Antibiotics:  none  DVT prophylaxis:  SCDs  HPI/Subjective:  Patient seen on dialysis, denies any complaints-denies  chest pain, no shortness of breath.  Objective: Filed Vitals:   10/01/12 1230 10/01/12 1245 10/01/12 1328 10/01/12 1746  BP: 125/56 153/65 150/60 158/57  Pulse: 53 51 80 62  Temp:  98.3 F (36.8 C) 97.9 F (36.6 C) 98.9 F (37.2 C)  TempSrc:  Oral Oral Oral  Resp:  18 18 18   Height:      Weight:  63.8 kg (140 lb 10.5 oz)    SpO2:  98% 100% 98%   Exam:  General: No acute respiratory distress  Lungs: Clear to auscultation bilaterally without wheezes or crackles  Cardiovascular: Regular rate and rhythm without murmur gallop or rub  Abdomen: Nontender, nondistended, soft, bowel sounds positive, no rebound, no ascites, no appreciable mass  Extremities: No significant cyanosis, clubbing, or edema bilateral lower extremities    Please see the full discharge summary of 10/29 per Dr. Marye Round   Time spent: [facility] Pager [phone]. If 8PM-8AM, please contact night-coverage at [email or web address], password [facility] 10/01/2012, 7:04 PM  LOS: 14 days

## 2012-10-04 NOTE — Progress Notes (Signed)
10/04/2012 1300 NCM spoke to daughter, [name]. Offered choice for [facility]. States no preference. NCM notified [facility] for [facility]. Spoke to [place], [facility] rep, they will do soc on 11/5. Jonnie Finner RN CCM Case Mgmt phone [phone]

## 2012-10-04 NOTE — Progress Notes (Addendum)
10/04/2012 0930 NCM attempted call to pt's number. Mail box is full. Unable to leave message. NCM contacted [employer], [facility]. States she did follow pt for 30 days in [place] assessment program. They currently are not following him. She provided contact number for dtr, Ryusei Mays [phone] and brother, Wells Moring [phone]. Attempted call dtr and left message on dtr's vm to return call. NCM spoke to brother, states pt is living with him and they are at dialysis. Requested call back in a hour. Jonnie Finner RN CCM Case Mgmt phone [phone]

## 2012-10-07 ENCOUNTER — Telehealth: Payer: Self-pay | Admitting: *Deleted

## 2012-10-07 NOTE — Telephone Encounter (Signed)
Lattie Haw from [facility] care called stating patient's caregiver, Maree Erie has refused care for patient last week and again this week.

## 2012-10-15 ENCOUNTER — Emergency Department (HOSPITAL_COMMUNITY): Payer: Medicare HMO

## 2012-10-15 ENCOUNTER — Encounter (HOSPITAL_COMMUNITY): Payer: Self-pay | Admitting: *Deleted

## 2012-10-15 ENCOUNTER — Emergency Department (HOSPITAL_COMMUNITY)
Admission: EM | Admit: 2012-10-15 | Discharge: 2012-10-15 | Disposition: A | Discharge status: Home / Self Care | Payer: Medicare HMO | Attending: Emergency Medicine | Admitting: Emergency Medicine

## 2012-10-15 DIAGNOSIS — R51 Headache: Secondary | ICD-10-CM | POA: Insufficient documentation

## 2012-10-15 DIAGNOSIS — S7000XA Contusion of unspecified hip, initial encounter: Secondary | ICD-10-CM | POA: Insufficient documentation

## 2012-10-15 DIAGNOSIS — Z79899 Other long term (current) drug therapy: Secondary | ICD-10-CM | POA: Insufficient documentation

## 2012-10-15 DIAGNOSIS — F17200 Nicotine dependence, unspecified, uncomplicated: Secondary | ICD-10-CM | POA: Insufficient documentation

## 2012-10-15 DIAGNOSIS — M129 Arthropathy, unspecified: Secondary | ICD-10-CM | POA: Insufficient documentation

## 2012-10-15 DIAGNOSIS — I129 Hypertensive chronic kidney disease with stage 1 through stage 4 chronic kidney disease, or unspecified chronic kidney disease: Secondary | ICD-10-CM | POA: Insufficient documentation

## 2012-10-15 DIAGNOSIS — R079 Chest pain, unspecified: Secondary | ICD-10-CM | POA: Insufficient documentation

## 2012-10-15 DIAGNOSIS — Z8679 Personal history of other diseases of the circulatory system: Secondary | ICD-10-CM | POA: Insufficient documentation

## 2012-10-15 DIAGNOSIS — Y929 Unspecified place or not applicable: Secondary | ICD-10-CM | POA: Insufficient documentation

## 2012-10-15 DIAGNOSIS — Z992 Dependence on renal dialysis: Secondary | ICD-10-CM | POA: Insufficient documentation

## 2012-10-15 DIAGNOSIS — N186 End stage renal disease: Secondary | ICD-10-CM | POA: Insufficient documentation

## 2012-10-15 DIAGNOSIS — F411 Generalized anxiety disorder: Secondary | ICD-10-CM | POA: Insufficient documentation

## 2012-10-15 DIAGNOSIS — W19XXXA Unspecified fall, initial encounter: Secondary | ICD-10-CM

## 2012-10-15 DIAGNOSIS — Z7982 Long term (current) use of aspirin: Secondary | ICD-10-CM | POA: Insufficient documentation

## 2012-10-15 DIAGNOSIS — W1809XA Striking against other object with subsequent fall, initial encounter: Secondary | ICD-10-CM | POA: Insufficient documentation

## 2012-10-15 DIAGNOSIS — Z8673 Personal history of transient ischemic attack (TIA), and cerebral infarction without residual deficits: Secondary | ICD-10-CM | POA: Insufficient documentation

## 2012-10-15 DIAGNOSIS — Y939 Activity, unspecified: Secondary | ICD-10-CM | POA: Insufficient documentation

## 2012-10-15 DIAGNOSIS — I252 Old myocardial infarction: Secondary | ICD-10-CM | POA: Insufficient documentation

## 2012-10-15 LAB — POCT I-STAT, CHEM 8
Calcium, Ion: 1.03 mmol/L — ABNORMAL LOW (ref 1.13–1.30)
HCT: 43 % (ref 39.0–52.0)
Hemoglobin: 14.6 g/dL (ref 13.0–17.0)
Sodium: 137 mEq/L (ref 135–145)
TCO2: 28 mmol/L (ref 0–100)

## 2012-10-15 MED ORDER — OXYCODONE-ACETAMINOPHEN 5-325 MG PO TABS
1.0000 | ORAL_TABLET | Freq: Once | ORAL | Status: AC
  Administered 2012-10-15: 1 via ORAL
  Filled 2012-10-15: qty 1

## 2012-10-15 NOTE — ED Notes (Signed)
Ambulated patient. No complaints of Nausea. Only pain patient had was in his right hip area.

## 2012-10-15 NOTE — ED Notes (Addendum)
Pt. Trevor [name] when getting out of the car; legs got tangled. Soreness on rt. Upper side of thigh. Stroke last year. Increased pain with ambulation.

## 2012-10-15 NOTE — ED Provider Notes (Signed)
History  This chart was scribed for Trevor Essex, MD by Jenne Campus, ED Scribe. This patient was seen in room A04C/A04C and the patient's care was started at 7:04 PM.  CSN: [id number]  Arrival date & time 10/15/12  1645   First MD Initiated Contact with Patient 10/15/12 1904      Chief Complaint  Patient presents with  . Fall     The history is provided by the patient. No language interpreter was used.    Trevor [name] is a 66 y.o. male who presents to the Emergency Department complaining of a fall getting out of a car PTA. Pt states that he thinks his legs just "got twisted" making him fall face first onto the pavement. He reports that he hit the underside of his chin and the right-side of his body on the pavement. He states that he lost feeling in his right leg which has improved but he still reports problems ambulating. He denies LOC and lightheadedness or dizziness as preceding symptoms. He reports that he was given hydrocodone (which he received from a prior dental surgery) with mild improvement in pain PTA.  He denies CP, neck pain, cough, SOB, testicular pain and fevers as associated symptoms. He denies having a h/o back pain and leg pain. He is a dialysis pt and reports that his last dialysis treatment was today. He denies missing any treatments recently. He also has a h/o HTN, anxiety and CVA. He is a current everyday smoker and occasional alcohol user.   Past Medical History  Diagnosis Date  . HTN (hypertension)   . Anxiety   . Cardiomyopathy   . Renal insufficiency   . Myocardial infarction   . CVA (cerebral infarction)   . Chest pain   . Shortness of breath   . Stroke   . Headache   . Arthritis     Past Surgical History  Procedure Date  . Tee without cardioversion 10/09/2011    Procedure: TRANSESOPHAGEAL ECHOCARDIOGRAM (TEE);  Surgeon: Lelon Perla, MD;  Location: [facility] ENDOSCOPY;  Service: Cardiovascular;  Laterality: N/A;  . Right hand   . Insertion of  dialysis catheter 09/21/2012    Procedure: INSERTION OF DIALYSIS CATHETER;  Surgeon: Angelia Mould, MD;  Location: [facility] OR;  Service: Vascular;  Laterality: N/A;  Right Internal Jugular Placement    Family History  Problem Relation Age of Onset  . Heart attack Mother     MI in her 34s  . Anesthesia problems Neg Hx   . Hypotension Neg Hx   . Malignant hyperthermia Neg Hx   . Pseudochol deficiency Neg Hx     History  Substance Use Topics  . Smoking status: Current Every Day Smoker -- 0.5 packs/day for 50 years    Types: Cigarettes  . Smokeless tobacco: Never Used  . Alcohol Use: Yes     Comment: Occasional      Review of Systems  A complete 10 system review of systems was obtained and all systems are negative except as noted in the HPI and PMH.   Allergies  Review of patient's allergies indicates no known allergies.  Home Medications   Current Outpatient Rx  Name  Route  Sig  Dispense  Refill  . ACETAMINOPHEN 325 MG PO TABS   Oral   Take 650 mg by mouth every 6 (six) hours as needed. For pain         . AMLODIPINE BESYLATE 5 MG PO TABS   Oral  Take 1 tablet (5 mg total) by mouth at bedtime.         . ASPIRIN 81 MG PO CHEW   Oral   Chew 1 tablet (81 mg total) by mouth daily.         Marland Kitchen DIALYVITE PO TABS   Oral   Take 1 tablet by mouth daily.      0   . NITROGLYCERIN 0.4 MG SL SUBL   Sublingual   Place 1 tablet (0.4 mg total) under the tongue every 5 (five) minutes as needed. For chest pain   25 tablet   3     Triage Vitals: BP 163/63  Pulse 69  Temp 97.5 F (36.4 C) (Oral)  Resp 16  SpO2 97%  Physical Exam  Nursing note and vitals reviewed. Constitutional: He is oriented to person, place, and time. He appears well-developed and well-nourished. No distress.  HENT:  Head: Normocephalic.       Small abrasion to the right lower lip  Eyes: Conjunctivae normal and EOM are normal.  Neck: Neck supple. No tracheal deviation present.    Cardiovascular: Normal rate, regular rhythm, normal heart sounds and intact distal pulses.   Pulmonary/Chest: Effort normal and breath sounds normal. No respiratory distress.  Abdominal: Soft. There is no tenderness.  Musculoskeletal: Normal range of motion. He exhibits tenderness. He exhibits no edema.       Tenderness over the right iliac crest, full ROM of the right leg without pain, no pain with leg roll, no L spine tenderness, no step offs, +2 DP and PT pulses, shunt in RUE has good bruit and good thrill  Neurological: He is alert and oriented to person, place, and time. He has normal reflexes.  Skin: Skin is warm and dry.  Psychiatric: He has a normal mood and affect. His behavior is normal.    ED Course  Procedures (including critical care time)  DIAGNOSTIC STUDIES: Oxygen Saturation is 97% on room air, adequate by my interpretation.    COORDINATION OF CARE: 7:30 PM- Discussed treatment plan which includes x-ray of the right hip with pt at bedside and pt agreed to plan.  8:50 PM-Informed pt of negative radiology reports.  9:00 PM- Ordered one tablet of 5-325 mg Percocet.  Labs Reviewed - No data to display Dg Chest 2 View  10/15/2012  *RADIOLOGY REPORT*  Clinical Data: Fall  CHEST - 2 VIEW  Comparison: 09/21/2012  Findings: Lungs are essentially clear.  No focal consolidation. No pleural effusion or pneumothorax.  Cardiomediastinal silhouette is within normal limits.  Stable right IJ dual lumen dialysis catheter.  Mild degenerative changes of the visualized thoracolumbar spine.  IMPRESSION: No evidence of acute cardiopulmonary disease.   Original Report Authenticated By: Julian Hy, M.D.    Dg Hip Complete Right  10/15/2012  *RADIOLOGY REPORT*  Clinical Data: Fall, right hip pain  RIGHT HIP - COMPLETE 2+ VIEW  Comparison: None.  Findings: No fracture or dislocation is seen.  Mild degenerative changes of the right hip.  The visualized bony pelvis appears intact.   Degenerative changes the lower lumbar spine.  IMPRESSION: No fracture or dislocation is seen.   Original Report Authenticated By: Julian Hy, M.D.    Ct Head Wo Contrast  10/15/2012  *RADIOLOGY REPORT*  Clinical Data: History of fall with injury to the chin.  CT HEAD WITHOUT CONTRAST  Technique:  Contiguous axial images were obtained from the base of the skull through the vertex without contrast.  Comparison: MRI of  the brain 10/08/11.  Head CT 10/07/2011.  Findings: Areas of low attenuation in the left frontal lobe white matter are new compared to the prior head CT, but are compatible with encephalomalacia related to prior left PCA territory infarction demonstrated on MRI of the brain 10/08/2011. Encephalomalacia is also noted in the medial aspect of the right occipital lobe related to prior PCA territory infarction.  There is mild cerebral and cerebellar atrophy.  Patchy areas of low attenuation are noted throughout the deep and periventricular white matter of the cerebral hemispheres bilaterally, compatible with microvascular ischemic disease.  No definite acute intracranial abnormalities are noted.  Specifically, no signs of acute post- traumatic intracranial hemorrhage, no definite area of acute/subacute cerebral ischemia, no focal mass, mass effect, hydrocephalus or abnormal intra or extra-axial fluid collections. No acute displaced skull fractures are identified.  Visualized paranasal sinuses and mastoids are well pneumatized.  IMPRESSION: 1.  No acute intracranial abnormalities. 2.  Chronic ischemic changes in the brain redemonstrated, as above. 3.  Mild cerebral and cerebellar atrophy.   Original Report Authenticated By: Vinnie Langton, M.D.    Ct Pelvis Wo Contrast  10/15/2012  *RADIOLOGY REPORT*  Clinical Data:  History of trauma from a fall.  CT PELVIS WITHOUT CONTRAST  Technique:  Multidetector CT imaging of the pelvis was performed following the standard protocol without intravenous  contrast.  Comparison:   None.  Findings:  No acute displaced fractures of the bony pelvis or the visualized portions of the proximal femurs.  Bilateral femoral heads are properly located.  Atherosclerosis of the pelvic vasculature.  Prostate and urinary bladder are unremarkable in appearance.  IMPRESSION: 1.  No evidence of acute bony trauma to the pelvis.   Original Report Authenticated By: Vinnie Langton, M.D.      No diagnosis found.    MDM  Patient fell from car, after legs got twisted, Landed on R hip.  Did not hit head or LOC.  TTP over R iliac crest.  No headache, neck pain, back pain, abdominal pain. Pain does not radiate down leg.  No weakness, numbness, tingling.  Patient's imaging is negative for fracture. He is at his baseline mentation. He is ambulatory  The ED without assistance.   Date: 10/15/2012  Rate: 69  Rhythm: normal sinus rhythm  QRS Axis: normal  Intervals: normal  ST/T Wave abnormalities: ST depressions laterally  Conduction Disutrbances:none  Narrative Interpretation: LVH with inferolateral T wave inversions  Old EKG Reviewed: unchanged    I personally performed the services described in this documentation, which was scribed in my presence. The recorded information has been reviewed and is accurate.         Trevor Essex, MD 10/15/12 2222

## 2012-10-15 NOTE — ED Notes (Signed)
Pt dc to home with family.  Pt taken via w/c to exit without difficulty.

## 2012-11-01 ENCOUNTER — Encounter: Payer: Medicare HMO | Admitting: Cardiology

## 2012-11-01 NOTE — Progress Notes (Signed)
HPI: Pleasant male for fu of CM. Previous renal dopplers revealed no RAS. Abdominal ultrasound in 12/12 showed a 2.7 x 2.6 abdominal aortic aneurysm. FU recommended in one year. Carotid dopplers in Nov 2012 showed no significant stenosis bilaterally. Last echocardiogram in October of 2013 showed an ejection fraction of 30-35% and grade 2 diastolic dysfunction. The left atrium was mildly dilated. Cardiac catheterization in October of 2013 showed a 25% left main. There was a 30-40% mid LAD and a 60% first diagonal. There was no disease in the circumflex. There was a 50% long area of stenosis in the right coronary artery and a 50% PDA. Ejection fraction was 35-40%. Medical therapy recommended. Patient also with history of noncompliance and substance abuse.   Current Outpatient Prescriptions  Medication Sig Dispense Refill  . acetaminophen (TYLENOL) 325 MG tablet Take 650 mg by mouth every 6 (six) hours as needed. For pain      . amLODipine (NORVASC) 5 MG tablet Take 1 tablet (5 mg total) by mouth at bedtime.      Marland Kitchen amoxicillin (AMOXIL) 500 MG capsule Take 500 mg by mouth 3 (three) times daily.      Marland Kitchen aspirin 81 MG chewable tablet Chew 1 tablet (81 mg total) by mouth daily.      . B Complex-C-Folic Acid (DIALYVITE TABLET) TABS Take 1 tablet by mouth daily.    0  . HYDROcodone-acetaminophen (NORCO/VICODIN) 5-325 MG per tablet Take 1 tablet by mouth every 6 (six) hours as needed. For pain      . nitroGLYCERIN (NITROSTAT) 0.4 MG SL tablet Place 1 tablet (0.4 mg total) under the tongue every 5 (five) minutes as needed. For chest pain  25 tablet  3     Past Medical History  Diagnosis Date  . HTN (hypertension)   . Anxiety   . Cardiomyopathy   . Renal insufficiency   . Myocardial infarction   . CVA (cerebral infarction)   . Chest pain   . Shortness of breath   . Stroke   . Headache   . Arthritis     Past Surgical History  Procedure Date  . Tee without cardioversion 10/09/2011    Procedure:  TRANSESOPHAGEAL ECHOCARDIOGRAM (TEE);  Surgeon: Lelon Perla, MD;  Location: [facility] ENDOSCOPY;  Service: Cardiovascular;  Laterality: N/A;  . Right hand   . Insertion of dialysis catheter 09/21/2012    Procedure: INSERTION OF DIALYSIS CATHETER;  Surgeon: Angelia Mould, MD;  Location: [facility];  Service: Vascular;  Laterality: N/A;  Right Internal Jugular Placement    History   Social History  . Marital Status: Single    Spouse Name: N/A    Number of Children: 2  . Years of Education: N/A   Occupational History  . Not on file.   Social History Main Topics  . Smoking status: Current Every Day Smoker -- 0.5 packs/day for 50 years    Types: Cigarettes  . Smokeless tobacco: Never Used  . Alcohol Use: Yes     Comment: Occasional  . Drug Use: 2 per week    Special: "Crack" cocaine  . Sexually Active: Yes   Other Topics Concern  . Not on file   Social History Narrative  . No narrative on file    ROS: no fevers or chills, productive cough, hemoptysis, dysphasia, odynophagia, melena, hematochezia, dysuria, hematuria, rash, seizure activity, orthopnea, PND, pedal edema, claudication. Remaining systems are negative.  Physical Exam: Well-developed well-nourished in no acute distress.  Skin is warm  and dry.  HEENT is normal.  Neck is supple.  Chest is clear to auscultation with normal expansion.  Cardiovascular exam is regular rate and rhythm.  Abdominal exam nontender or distended. No masses palpated. Extremities show no edema. neuro grossly intact  ECG     This encounter was created in error - please disregard.

## 2012-11-02 ENCOUNTER — Encounter: Payer: Self-pay | Admitting: Vascular Surgery

## 2012-11-03 ENCOUNTER — Ambulatory Visit: Payer: Medicare HMO | Admitting: Vascular Surgery

## 2013-01-05 ENCOUNTER — Encounter: Payer: Self-pay | Admitting: Vascular Surgery

## 2013-01-06 ENCOUNTER — Ambulatory Visit: Payer: Medicare HMO | Admitting: Vascular Surgery

## 2013-01-11 ENCOUNTER — Other Ambulatory Visit: Payer: Self-pay | Admitting: *Deleted

## 2013-01-20 ENCOUNTER — Encounter (HOSPITAL_COMMUNITY)
Admission: RE | Admit: 2013-01-20 | Discharge: 2013-01-20 | Disposition: A | Discharge status: Home / Self Care | Payer: Medicare HMO | Source: Ambulatory Visit | Attending: Vascular Surgery | Admitting: Vascular Surgery

## 2013-01-20 DIAGNOSIS — N186 End stage renal disease: Secondary | ICD-10-CM

## 2013-01-20 DIAGNOSIS — Z452 Encounter for adjustment and management of vascular access device: Secondary | ICD-10-CM | POA: Insufficient documentation

## 2013-01-20 NOTE — Progress Notes (Signed)
[facility] Catheter Removal Procedure Note  Diagnosis: ESRD with Functioning AVF/AVGG  Plan:  Remove right diatek catheter  Consent signed:  yes Time out completed:  yes Coumadin:  no PT/INR (if applicable):   Other labs:   Procedure: 1.  Sterile prepping and draping over catheter area 2. 8 ml 2% lidocaine plain instilled at removal site. 3.  right catheter removed in its entirety with cuff in tact. 4.  Complications: none  5. Tip of catheter sent for culture:  no   Patient tolerated procedure well:  yes Pressure held, no bleeding noted, dressing applied Instructions given to the pt regarding wound care and bleeding.  Other:  Kahil Agner J 01/20/2013 2:08 PM

## 2013-01-20 NOTE — H&P (Signed)
[facility] SHORT STAY H&P  CC: ESRD   HPI: Trevor [name] is a 67 y.o. male who has been on HD through  functioning Hemodialysis access in the left upper extremity. They are here for HD catheter removal. Pt. denies signs of steal syndrome.  Past Medical History  Diagnosis Date  . HTN (hypertension)   . Anxiety   . Cardiomyopathy   . Renal insufficiency   . Myocardial infarction   . CVA (cerebral infarction)   . Chest pain   . Shortness of breath   . Stroke   . Headache   . Arthritis     FH:  Non-Contributory  Social HX History  Substance Use Topics  . Smoking status: Current Every Day Smoker -- 0.50 packs/day for 50 years    Types: Cigarettes  . Smokeless tobacco: Never Used  . Alcohol Use: Yes     Comment: Occasional    Allergies No Known Allergies  Medications Current Outpatient Prescriptions  Medication Sig Dispense Refill  . acetaminophen (TYLENOL) 325 MG tablet Take 650 mg by mouth every 6 (six) hours as needed. For pain      . amLODipine (NORVASC) 5 MG tablet Take 1 tablet (5 mg total) by mouth at bedtime.      Marland Kitchen amoxicillin (AMOXIL) 500 MG capsule Take 500 mg by mouth 3 (three) times daily.      Marland Kitchen aspirin 81 MG chewable tablet Chew 1 tablet (81 mg total) by mouth daily.      . B Complex-C-Folic Acid (DIALYVITE TABLET) TABS Take 1 tablet by mouth daily.    0  . HYDROcodone-acetaminophen (NORCO/VICODIN) 5-325 MG per tablet Take 1 tablet by mouth every 6 (six) hours as needed. For pain      . nitroGLYCERIN (NITROSTAT) 0.4 MG SL tablet Place 1 tablet (0.4 mg total) under the tongue every 5 (five) minutes as needed. For chest pain  25 tablet  3   No current facility-administered medications for this encounter.    Labs COAG Lab Results  Component Value Date   INR 1.07 09/28/2012   INR 1.22 09/17/2012   INR 0.97 10/07/2011   No results found for this basename: PTT    PHYSICAL EXAM  Filed Vitals:   01/20/13 1301  BP: 149/71  Pulse:  67  Temp: 97.6 F (36.4 C)  Resp: 18    General:  WDWN in NAD HENT: WNL Eyes: Pupils equal Pulmonary: normal non-labored breathing  Cardiac: RRR, Skin: normal, no cyanosis, jaundice, pallor or bruising Vascular Exam/Pulses: 2+ radial pulses in LEFT upper extremity. Extremities without ischemic changes, no Gangrene , no cellulitis; no open wounds;   There is a good thrill and good bruit in the left arm AVF. Hand grip is 5/5 and sensation in digits is intact;   Impression: This is a 67 y.o. male who has a functioning HD access.  Plan: Removal of Right IJ HD catheter Opal J 01/20/2013   2:07 PM

## 2013-01-21 NOTE — H&P (Signed)
Agree with above.  Gae Gallop Beeper [id number] 01/21/2013

## 2013-02-03 ENCOUNTER — Other Ambulatory Visit: Payer: Self-pay | Admitting: Nephrology

## 2013-02-03 ENCOUNTER — Ambulatory Visit
Admission: RE | Admit: 2013-02-03 | Discharge: 2013-02-03 | Disposition: A | Discharge status: Home / Self Care | Payer: Medicare HMO | Source: Ambulatory Visit | Attending: Nephrology | Admitting: Nephrology

## 2013-02-03 DIAGNOSIS — R52 Pain, unspecified: Secondary | ICD-10-CM

## 2013-05-16 IMAGING — CT CT PELVIS W/O CM
3 series · 15 of 32 positions shown, 20 images · non-contrast
Comparison: None.

CLINICAL DATA: History of trauma from a fall.

CT PELVIS WITHOUT CONTRAST
TECHNIQUE: Multidetector CT imaging of the pelvis was performed
following the standard protocol without intravenous contrast.

[Series 2: — · axial · 0.70mm/px · z∈[-236,-67]mm · 4 of 76 slices shown, 9 images]
[im 16/76  soft-tissue]
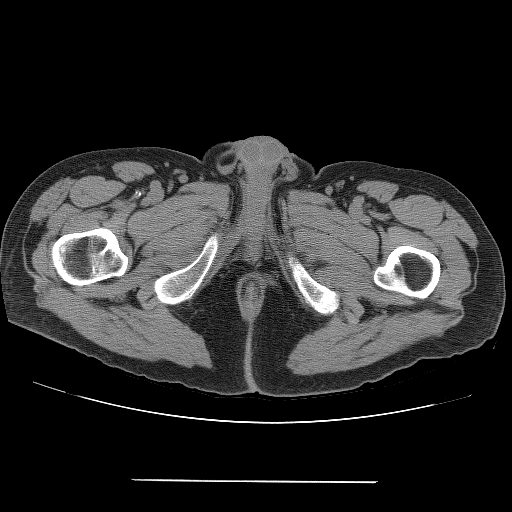
[im 16/76  lung]
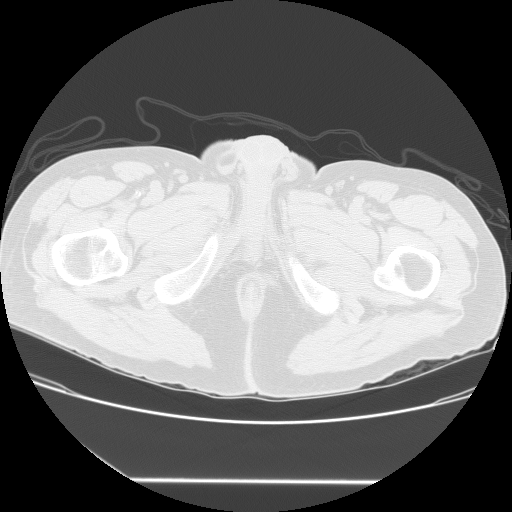
[im 16/76  bone]
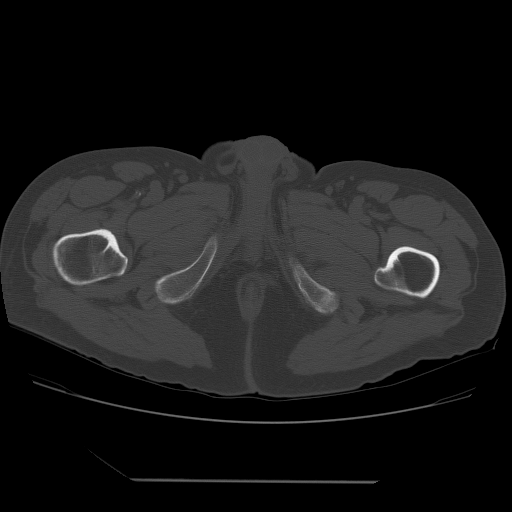
[im 31/76  soft-tissue]
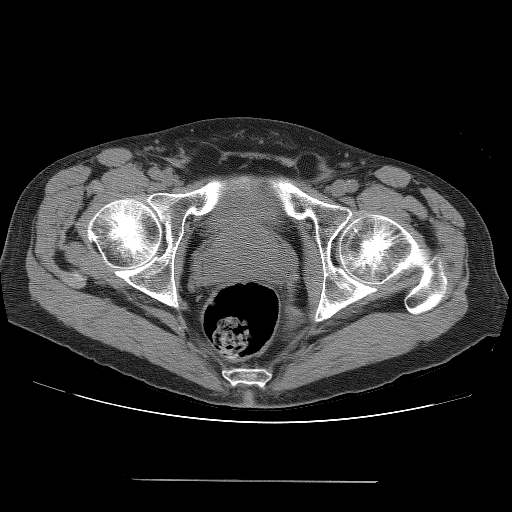
[im 31/76  lung]
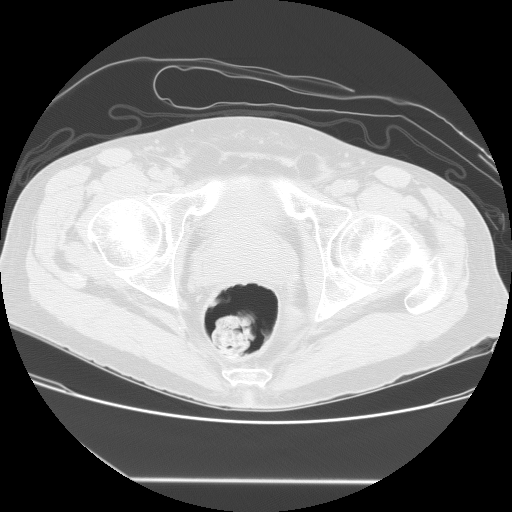
[im 46/76  soft-tissue]
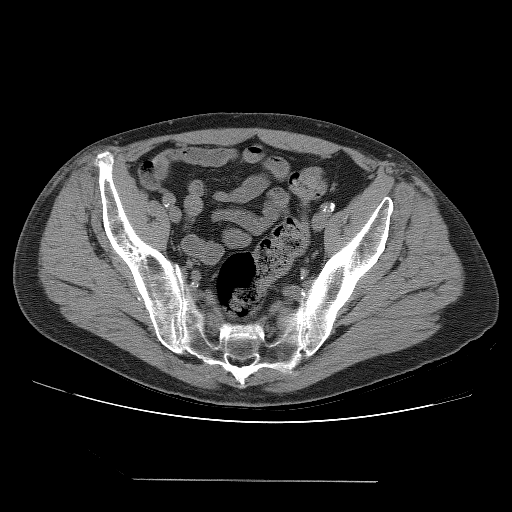
[im 46/76  lung]
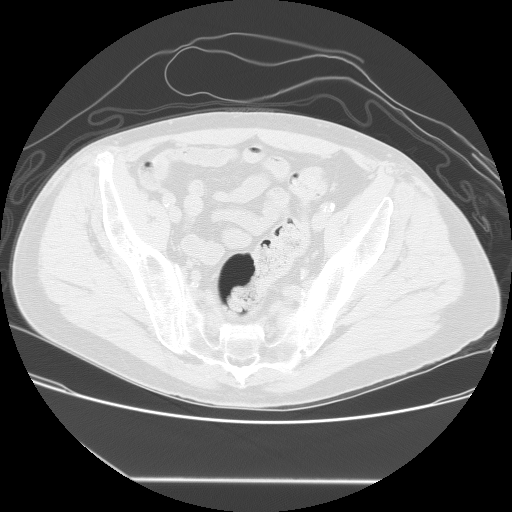
[im 61/76  soft-tissue]
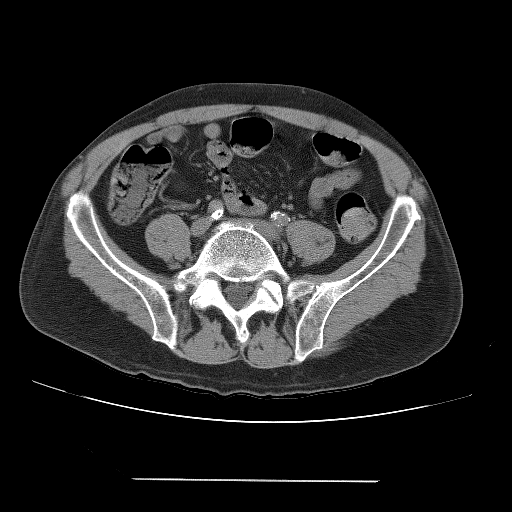
[im 61/76  lung]
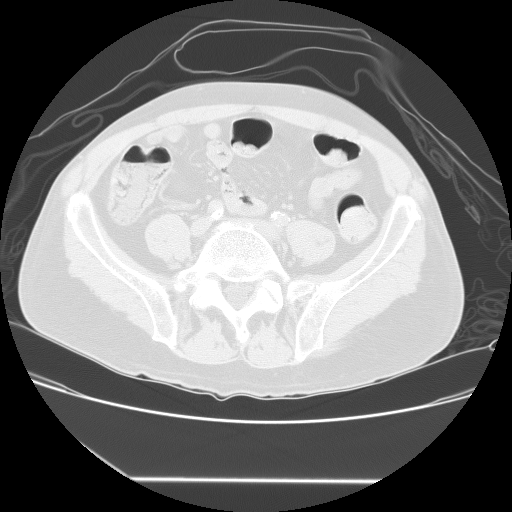

[Series 300: sag · sagittal · 0.70mm/px · 3 of 172 slices shown]
[im 13/172  soft-tissue]
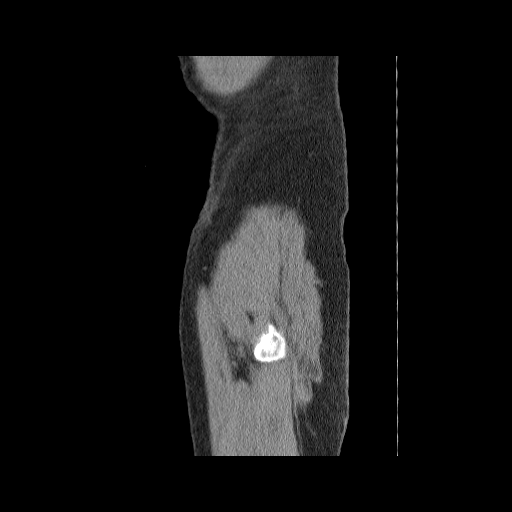
[im 37/172  soft-tissue]
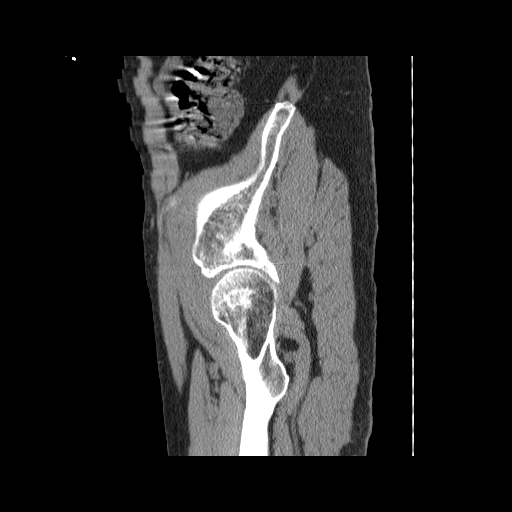
[im 62/172  soft-tissue]
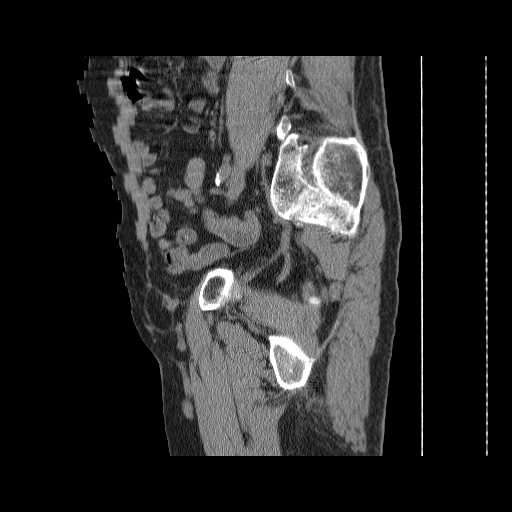

[Series 301: coronal · coronal · 0.70mm/px · 8 of 122 slices shown]
[im 13/122  soft-tissue]
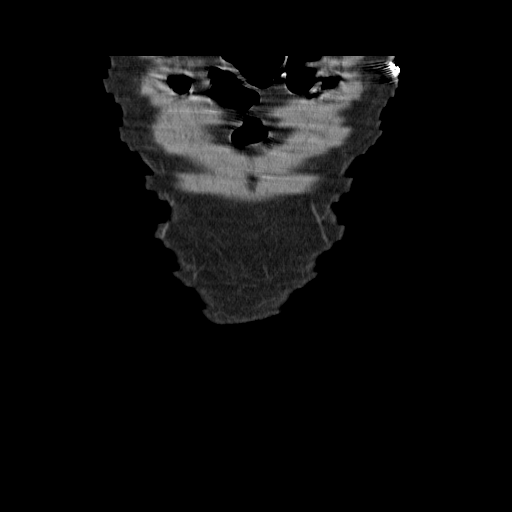
[im 25/122  soft-tissue]
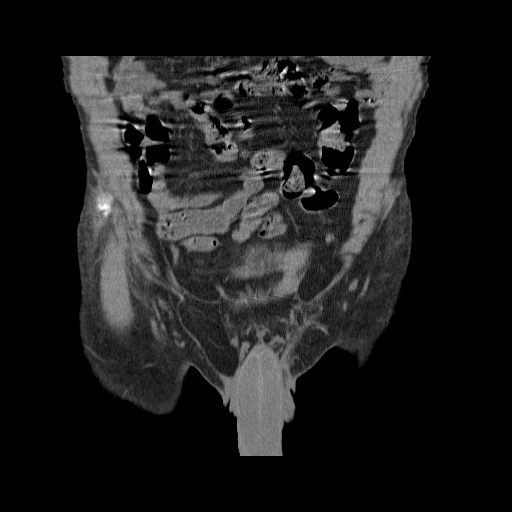
[im 37/122  soft-tissue]
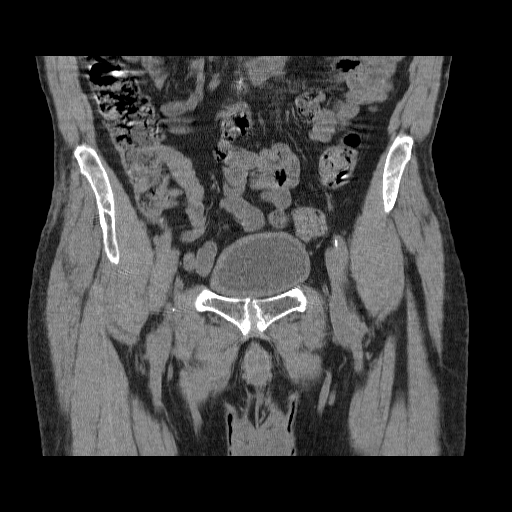
[im 49/122  soft-tissue]
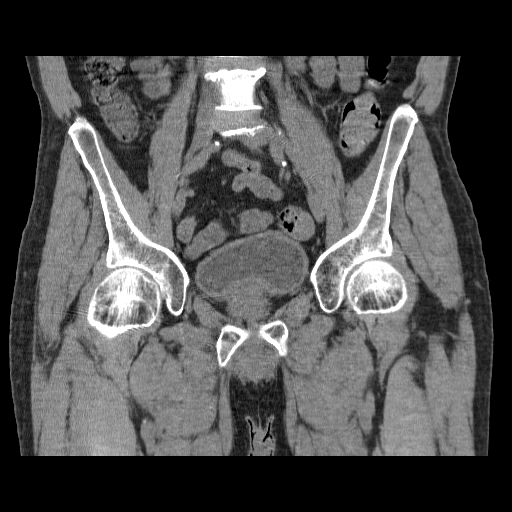
[im 73/122  soft-tissue]
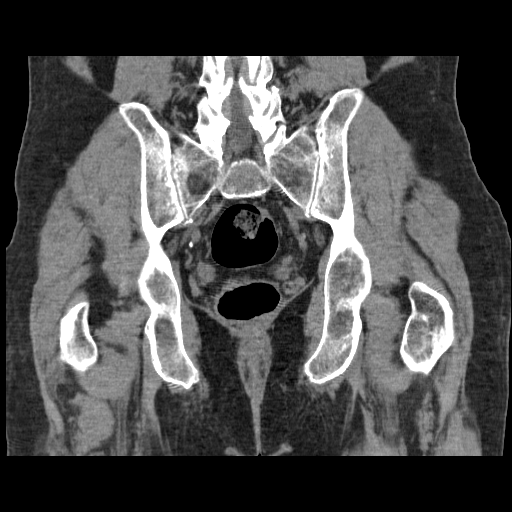
[im 85/122  soft-tissue]
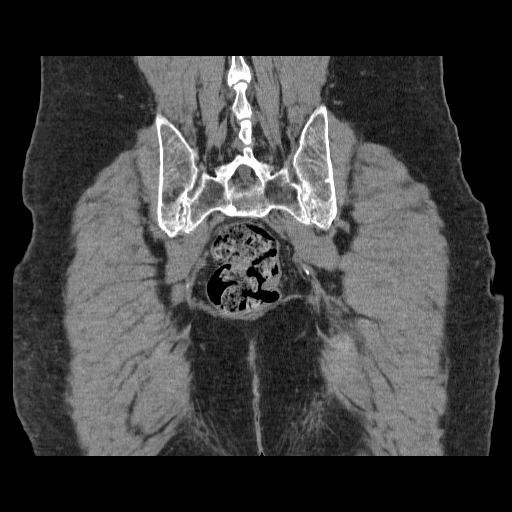
[im 97/122  soft-tissue]
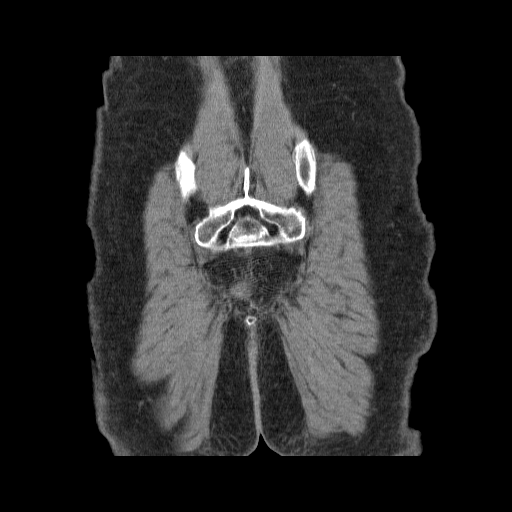
[im 109/122  soft-tissue]
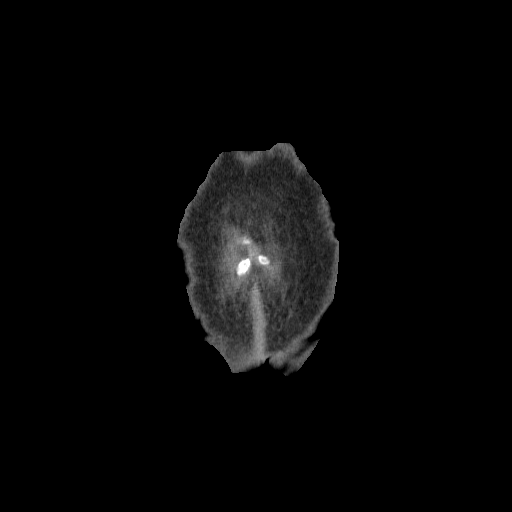

[15 of 32 positions shown; findings below may reference images not displayed]

FINDINGS: No acute displaced fractures of the bony pelvis or the
visualized portions of the proximal femurs.  Bilateral femoral
heads are properly located.  Atherosclerosis of the pelvic
vasculature.  Prostate and urinary bladder are unremarkable in
appearance.
IMPRESSION: 1.  No evidence of acute bony trauma to the pelvis.

## 2013-12-12 ENCOUNTER — Emergency Department (HOSPITAL_COMMUNITY): Payer: Medicare HMO

## 2013-12-12 ENCOUNTER — Encounter (HOSPITAL_COMMUNITY): Payer: Self-pay | Admitting: Emergency Medicine

## 2013-12-12 ENCOUNTER — Emergency Department (HOSPITAL_COMMUNITY)
Admission: EM | Admit: 2013-12-12 | Discharge: 2013-12-12 | Disposition: A | Discharge status: Home / Self Care | Payer: Medicare HMO | Attending: Emergency Medicine | Admitting: Emergency Medicine

## 2013-12-12 DIAGNOSIS — Z8659 Personal history of other mental and behavioral disorders: Secondary | ICD-10-CM | POA: Insufficient documentation

## 2013-12-12 DIAGNOSIS — Z7982 Long term (current) use of aspirin: Secondary | ICD-10-CM | POA: Insufficient documentation

## 2013-12-12 DIAGNOSIS — Z79899 Other long term (current) drug therapy: Secondary | ICD-10-CM | POA: Insufficient documentation

## 2013-12-12 DIAGNOSIS — Z8673 Personal history of transient ischemic attack (TIA), and cerebral infarction without residual deficits: Secondary | ICD-10-CM | POA: Insufficient documentation

## 2013-12-12 DIAGNOSIS — M129 Arthropathy, unspecified: Secondary | ICD-10-CM | POA: Insufficient documentation

## 2013-12-12 DIAGNOSIS — I252 Old myocardial infarction: Secondary | ICD-10-CM | POA: Insufficient documentation

## 2013-12-12 DIAGNOSIS — Z992 Dependence on renal dialysis: Secondary | ICD-10-CM | POA: Insufficient documentation

## 2013-12-12 DIAGNOSIS — N186 End stage renal disease: Secondary | ICD-10-CM | POA: Insufficient documentation

## 2013-12-12 DIAGNOSIS — R5381 Other malaise: Secondary | ICD-10-CM | POA: Insufficient documentation

## 2013-12-12 DIAGNOSIS — J029 Acute pharyngitis, unspecified: Secondary | ICD-10-CM | POA: Insufficient documentation

## 2013-12-12 DIAGNOSIS — J3489 Other specified disorders of nose and nasal sinuses: Secondary | ICD-10-CM | POA: Insufficient documentation

## 2013-12-12 DIAGNOSIS — R531 Weakness: Secondary | ICD-10-CM

## 2013-12-12 DIAGNOSIS — Z792 Long term (current) use of antibiotics: Secondary | ICD-10-CM | POA: Insufficient documentation

## 2013-12-12 DIAGNOSIS — R55 Syncope and collapse: Secondary | ICD-10-CM | POA: Insufficient documentation

## 2013-12-12 DIAGNOSIS — I120 Hypertensive chronic kidney disease with stage 5 chronic kidney disease or end stage renal disease: Secondary | ICD-10-CM | POA: Insufficient documentation

## 2013-12-12 DIAGNOSIS — F17200 Nicotine dependence, unspecified, uncomplicated: Secondary | ICD-10-CM | POA: Insufficient documentation

## 2013-12-12 DIAGNOSIS — R5383 Other fatigue: Principal | ICD-10-CM

## 2013-12-12 DIAGNOSIS — R42 Dizziness and giddiness: Secondary | ICD-10-CM | POA: Insufficient documentation

## 2013-12-12 LAB — COMPREHENSIVE METABOLIC PANEL
ALBUMIN: 3.6 g/dL (ref 3.5–5.2)
ALK PHOS: 75 U/L (ref 39–117)
ALT: 9 U/L (ref 0–53)
AST: 12 U/L (ref 0–37)
BILIRUBIN TOTAL: 0.6 mg/dL (ref 0.3–1.2)
BUN: 15 mg/dL (ref 6–23)
CHLORIDE: 97 meq/L (ref 96–112)
CO2: 30 mEq/L (ref 19–32)
CREATININE: 4.07 mg/dL — AB (ref 0.50–1.35)
Calcium: 9 mg/dL (ref 8.4–10.5)
GFR calc non Af Amer: 14 mL/min — ABNORMAL LOW (ref >90)
GFR, EST AFRICAN AMERICAN: 16 mL/min — AB (ref >90)
GLUCOSE: 81 mg/dL (ref 70–99)
Potassium: 3.4 mEq/L — ABNORMAL LOW (ref 3.7–5.3)
Sodium: 141 mEq/L (ref 137–147)
Total Protein: 7.8 g/dL (ref 6.0–8.3)

## 2013-12-12 LAB — CBC WITH DIFFERENTIAL/PLATELET
BASOS PCT: 1 % (ref 0–1)
Basophils Absolute: 0.1 10*3/uL (ref 0.0–0.1)
EOS ABS: 0.3 10*3/uL (ref 0.0–0.7)
Eosinophils Relative: 4 % (ref 0–5)
HEMATOCRIT: 31.3 % — AB (ref 39.0–52.0)
HEMOGLOBIN: 10.7 g/dL — AB (ref 13.0–17.0)
LYMPHS ABS: 1 10*3/uL (ref 0.7–4.0)
Lymphocytes Relative: 14 % (ref 12–46)
MCH: 30.5 pg (ref 26.0–34.0)
MCHC: 34.2 g/dL (ref 30.0–36.0)
MCV: 89.2 fL (ref 78.0–100.0)
MONO ABS: 0.9 10*3/uL (ref 0.1–1.0)
MONOS PCT: 13 % — AB (ref 3–12)
Neutro Abs: 4.6 10*3/uL (ref 1.7–7.7)
Neutrophils Relative %: 69 % (ref 43–77)
Platelets: 204 10*3/uL (ref 150–400)
RBC: 3.51 MIL/uL — AB (ref 4.22–5.81)
RDW: 13.5 % (ref 11.5–15.5)
WBC: 6.7 10*3/uL (ref 4.0–10.5)

## 2013-12-12 LAB — POCT I-STAT TROPONIN I: Troponin i, poc: 0.02 ng/mL (ref 0.00–0.08)

## 2013-12-12 NOTE — Discharge Instructions (Signed)
Continue to eat a well balanced diet. Please follow up with your doctor for recheck in 2-3 days. Return if worsening.    Near-Syncope Near-syncope (commonly known as near fainting) is sudden weakness, dizziness, or feeling like you might pass out. During an episode of near-syncope, you may also develop pale skin, have tunnel vision, or feel sick to your stomach (nauseous). Near-syncope may occur when getting up after sitting or while standing for a long time. It is caused by a sudden decrease in blood flow to the brain. This decrease can result from various causes or triggers, most of which are not serious. However, because near-syncope can sometimes be a sign of something serious, a medical evaluation is required. The specific cause is often not determined. HOME CARE INSTRUCTIONS  Monitor your condition for any changes. The following actions may help to alleviate any discomfort you are experiencing:  Have someone stay with you until you feel stable.  Lie down right away if you start feeling like you might faint. Breathe deeply and steadily. Wait until all the symptoms have passed. Most of these episodes last only a few minutes. You may feel tired for several hours.   Drink enough fluids to keep your urine clear or pale yellow.   If you are taking blood pressure or heart medicine, get up slowly when seated or lying down. Take several minutes to sit and then stand. This can reduce dizziness.  Follow up with your health care provider as directed. SEEK IMMEDIATE MEDICAL CARE IF:   You have a severe headache.   You have unusual pain in the chest, abdomen, or back.   You are bleeding from the mouth or rectum, or you have black or tarry stool.   You have an irregular or very fast heartbeat.   You have repeated fainting or have seizure-like jerking during an episode.   You faint when sitting or lying down.   You have confusion.   You have difficulty walking.   You have severe  weakness.   You have vision problems.  MAKE SURE YOU:   Understand these instructions.  Will watch your condition.  Will get help right away if you are not doing well or get worse. Document Released: 11/17/2005 Document Revised: 07/20/2013 Document Reviewed: 04/22/2013 [facility] Patient Information 2014 [place].

## 2013-12-12 NOTE — ED Notes (Signed)
Pt was waiting for a ride home after dialysis and felt weak . Pt went back into the dialysis unit and staff accessed his dialysis graft to give him a saline bolus. EMS reported a B/P of 146/78 and HR 69.  Pt did not pass out and Pt did not fall. Pt was A/O on arrival to ED and interactive. Vitals WNL.

## 2013-12-12 NOTE — ED Provider Notes (Signed)
CSN: [id number]     Arrival date & time 12/12/13  1807 History   First MD Initiated Contact with Patient 12/12/13 1818     Chief Complaint  Patient presents with  . Weakness   (Consider location/radiation/quality/duration/timing/severity/associated sxs/prior Treatment) HPI Trevor [name] is a 68 y.o. male who presents to emergency after a near-syncopal episode after dialysis. Patient states that he woke up this morning not feeling well. He states he felt dizzy and weak. He went for his dialysis. He finished full dialysis and went outside to wait for his ride. States while waiting he became dizzy, states that he leaned on something, and states the last thing he remembers is the nurses rushing him inside and taking him to the IV saline bag. Patient received 400 cc of IV saline and EMS was called. Patient denies any chest pain or abdominal pain. He states that he still feels dizzy and weak. He admits to recent upper respiratory symptoms for the last 2 weeks. He states he takes Alka-Seltzer and TheraFlu for his symptoms. He admits to congestion, sore throat, cough. He denies any other complaints. He states that he has had multiple recent episodes of dizziness and weakness. He denies any focal neurological complaints.   Past Medical History  Diagnosis Date  . HTN (hypertension)   . Anxiety   . Cardiomyopathy   . Renal insufficiency   . Myocardial infarction   . CVA (cerebral infarction)   . Chest pain   . Shortness of breath   . Stroke   . Headache(784.0)   . Arthritis    Past Surgical History  Procedure Laterality Date  . Tee without cardioversion  10/09/2011    Procedure: TRANSESOPHAGEAL ECHOCARDIOGRAM (TEE);  Surgeon: Lelon Perla, MD;  Location: [facility] ENDOSCOPY;  Service: Cardiovascular;  Laterality: N/A;  . Right hand    . Insertion of dialysis catheter  09/21/2012    Procedure: INSERTION OF DIALYSIS CATHETER;  Surgeon: Angelia Mould, MD;  Location: [facility] OR;  Service: Vascular;   Laterality: N/A;  Right Internal Jugular Placement   Family History  Problem Relation Age of Onset  . Heart attack Mother     MI in her 19s  . Anesthesia problems Neg Hx   . Hypotension Neg Hx   . Malignant hyperthermia Neg Hx   . Pseudochol deficiency Neg Hx    History  Substance Use Topics  . Smoking status: Current Every Day Smoker -- 0.50 packs/day for 50 years    Types: Cigarettes  . Smokeless tobacco: Never Used  . Alcohol Use: Yes     Comment: Occasional    Review of Systems  Constitutional: Positive for fatigue. Negative for fever and chills.  HENT: Positive for congestion and sore throat.   Respiratory: Positive for cough. Negative for chest tightness and shortness of breath.   Cardiovascular: Negative for chest pain, palpitations and leg swelling.  Gastrointestinal: Negative for nausea, vomiting, abdominal pain, diarrhea and abdominal distention.  Genitourinary: Negative for dysuria, urgency, frequency and hematuria.  Musculoskeletal: Negative for arthralgias, myalgias, neck pain and neck stiffness.  Skin: Negative for rash.  Allergic/Immunologic: Negative for immunocompromised state.  Neurological: Positive for dizziness, syncope, weakness and light-headedness. Negative for numbness and headaches.    Allergies  Review of patient's allergies indicates no known allergies.  Home Medications   Current Outpatient Rx  Name  Route  Sig  Dispense  Refill  . acetaminophen (TYLENOL) 325 MG tablet   Oral   Take 650 mg by  mouth every 6 (six) hours as needed. For pain         . amLODipine (NORVASC) 5 MG tablet   Oral   Take 1 tablet (5 mg total) by mouth at bedtime.         Marland Kitchen amoxicillin (AMOXIL) 500 MG capsule   Oral   Take 500 mg by mouth 3 (three) times daily.         Marland Kitchen aspirin 81 MG chewable tablet   Oral   Chew 1 tablet (81 mg total) by mouth daily.         . B Complex-C-Folic Acid (DIALYVITE TABLET) TABS   Oral   Take 1 tablet by mouth daily.       0   . HYDROcodone-acetaminophen (NORCO/VICODIN) 5-325 MG per tablet   Oral   Take 1 tablet by mouth every 6 (six) hours as needed. For pain         . nitroGLYCERIN (NITROSTAT) 0.4 MG SL tablet   Sublingual   Place 1 tablet (0.4 mg total) under the tongue every 5 (five) minutes as needed. For chest pain   25 tablet   3    BP 126/66  Pulse 67  Temp(Src) 97.6 F (36.4 C) (Oral)  Resp 18  SpO2 100% Physical Exam  Nursing note and vitals reviewed. Constitutional: He is oriented to person, place, and time. He appears well-developed and well-nourished. No distress.  HENT:  Head: Normocephalic and atraumatic.  Right Ear: External ear normal.  Left Ear: External ear normal.  Mouth/Throat: Oropharynx is clear and moist.  Clear rhinorrhea, pharynx erythemous, uvula midline  Eyes: Conjunctivae are normal.  Neck: Normal range of motion. Neck supple.  No meningeal signs  Cardiovascular: Normal rate, regular rhythm and normal heart sounds.   Pulmonary/Chest: Effort normal and breath sounds normal. No respiratory distress. He has no wheezes. He has no rales.  Abdominal: Soft. Bowel sounds are normal. There is no tenderness.  Musculoskeletal: He exhibits no edema and no tenderness.  Lymphadenopathy:    He has no cervical adenopathy.  Neurological: He is alert and oriented to person, place, and time.  Skin: Skin is warm and dry. No erythema.  Psychiatric: He has a normal mood and affect.    ED Course  Procedures (including critical care time) Labs Review Labs Reviewed  CBC WITH DIFFERENTIAL - Abnormal; Notable for the following:    RBC 3.51 (*)    Hemoglobin 10.7 (*)    HCT 31.3 (*)    Monocytes Relative 13 (*)    All other components within normal limits  COMPREHENSIVE METABOLIC PANEL - Abnormal; Notable for the following:    Potassium 3.4 (*)    Creatinine, Ser 4.07 (*)    GFR calc non Af Amer 14 (*)    GFR calc Af Amer 16 (*)    All other components within normal  limits  POCT I-STAT TROPONIN I   Imaging Review Dg Chest 2 View  12/12/2013   CLINICAL DATA:  Weakness.  EXAM: CHEST  2 VIEW  COMPARISON:  DG CHEST 2 VIEW dated 10/15/2012  FINDINGS: Removal of the right IJ dialysis catheter. No airspace disease. No effusion. Aortic arch atherosclerosis. Cardiopericardial silhouette appears within normal limits.  IMPRESSION: No active cardiopulmonary disease.   Electronically Signed   By: Dereck Ligas M.D.   On: 12/12/2013 20:03    EKG Interpretation   None       MDM   1. Near syncope   2. Weakness  Patient in emergency department after a near syncopal episode after dialysis. Patient states he feels weak and dizzy. He received a bolus of normal saline by EMS. Patient's vital signs are normal. He denies any pain. He denies any chest pain or shortness of breath. Will get labs, troponin, EKG, chest x-ray.    Patient's workup is unremarkable. He actually received a meal, fluids, and he feels much better. Patient states he feels back to his baseline. He ambulated with his walker. He wants to go home. We'll discharge him home with close followup with his primary care Dr. He is not orthostatic, no signs of infection, no chest pain or shortness of breath.  Filed Vitals:   12/12/13 2046 12/12/13 2050 12/12/13 2051 12/12/13 2100  BP: 153/71 116/73 143/71 150/83  Pulse: 98 105 96 76  Temp:      TempSrc:      Resp:   18   SpO2:   100% 98%       Florene Route Makynzi [place]land, PA-C 12/12/13 2254

## 2013-12-16 NOTE — ED Provider Notes (Signed)
Medical screening examination/treatment/procedure(s) were performed by non-physician practitioner and as supervising physician I was immediately available for consultation/collaboration.  EKG Interpretation    Date/Time:  Monday December 12 2013 18:14:56 EST Ventricular Rate:  65 PR Interval:  155 QRS Duration: 92 QT Interval:  498 QTC Calculation: 518 R Axis:   42 Text Interpretation:  Sinus rhythm Borderline T abnormalities, lateral leads Prolonged QT interval ED PHYSICIAN INTERPRETATION AVAILABLE IN [facility] Confirmed by TEST, RECORD ([id number]) on 12/14/2013 8:35:25 AM             Virgel Manifold, MD 12/16/13 1441

## 2014-05-19 ENCOUNTER — Encounter (HOSPITAL_COMMUNITY): Payer: Self-pay | Admitting: Emergency Medicine

## 2014-05-19 ENCOUNTER — Observation Stay (HOSPITAL_COMMUNITY)
Admission: EM | Admit: 2014-05-19 | Discharge: 2014-05-20 | Disposition: A | Discharge status: Home / Self Care | Payer: Medicare HMO | Attending: Internal Medicine | Admitting: Internal Medicine

## 2014-05-19 ENCOUNTER — Observation Stay (HOSPITAL_COMMUNITY): Payer: Medicare HMO

## 2014-05-19 ENCOUNTER — Emergency Department (HOSPITAL_COMMUNITY): Payer: Medicare HMO

## 2014-05-19 DIAGNOSIS — I5022 Chronic systolic (congestive) heart failure: Secondary | ICD-10-CM

## 2014-05-19 DIAGNOSIS — Z720 Tobacco use: Secondary | ICD-10-CM | POA: Diagnosis present

## 2014-05-19 DIAGNOSIS — Y939 Activity, unspecified: Secondary | ICD-10-CM | POA: Insufficient documentation

## 2014-05-19 DIAGNOSIS — I509 Heart failure, unspecified: Secondary | ICD-10-CM

## 2014-05-19 DIAGNOSIS — I5043 Acute on chronic combined systolic (congestive) and diastolic (congestive) heart failure: Secondary | ICD-10-CM | POA: Diagnosis present

## 2014-05-19 DIAGNOSIS — D631 Anemia in chronic kidney disease: Secondary | ICD-10-CM

## 2014-05-19 DIAGNOSIS — I2510 Atherosclerotic heart disease of native coronary artery without angina pectoris: Secondary | ICD-10-CM | POA: Diagnosis present

## 2014-05-19 DIAGNOSIS — I428 Other cardiomyopathies: Secondary | ICD-10-CM

## 2014-05-19 DIAGNOSIS — N189 Chronic kidney disease, unspecified: Secondary | ICD-10-CM

## 2014-05-19 DIAGNOSIS — R079 Chest pain, unspecified: Secondary | ICD-10-CM | POA: Insufficient documentation

## 2014-05-19 DIAGNOSIS — N039 Chronic nephritic syndrome with unspecified morphologic changes: Secondary | ICD-10-CM

## 2014-05-19 DIAGNOSIS — N186 End stage renal disease: Secondary | ICD-10-CM

## 2014-05-19 DIAGNOSIS — I252 Old myocardial infarction: Secondary | ICD-10-CM | POA: Insufficient documentation

## 2014-05-19 DIAGNOSIS — R55 Syncope and collapse: Principal | ICD-10-CM

## 2014-05-19 DIAGNOSIS — F411 Generalized anxiety disorder: Secondary | ICD-10-CM | POA: Insufficient documentation

## 2014-05-19 DIAGNOSIS — W1809XA Striking against other object with subsequent fall, initial encounter: Secondary | ICD-10-CM | POA: Insufficient documentation

## 2014-05-19 DIAGNOSIS — Z992 Dependence on renal dialysis: Secondary | ICD-10-CM

## 2014-05-19 DIAGNOSIS — Z79899 Other long term (current) drug therapy: Secondary | ICD-10-CM | POA: Insufficient documentation

## 2014-05-19 DIAGNOSIS — F29 Unspecified psychosis not due to a substance or known physiological condition: Secondary | ICD-10-CM | POA: Insufficient documentation

## 2014-05-19 DIAGNOSIS — R42 Dizziness and giddiness: Secondary | ICD-10-CM | POA: Insufficient documentation

## 2014-05-19 DIAGNOSIS — F17200 Nicotine dependence, unspecified, uncomplicated: Secondary | ICD-10-CM

## 2014-05-19 DIAGNOSIS — IMO0002 Reserved for concepts with insufficient information to code with codable children: Secondary | ICD-10-CM | POA: Insufficient documentation

## 2014-05-19 DIAGNOSIS — Z8673 Personal history of transient ischemic attack (TIA), and cerebral infarction without residual deficits: Secondary | ICD-10-CM | POA: Insufficient documentation

## 2014-05-19 DIAGNOSIS — E876 Hypokalemia: Secondary | ICD-10-CM

## 2014-05-19 DIAGNOSIS — M129 Arthropathy, unspecified: Secondary | ICD-10-CM | POA: Insufficient documentation

## 2014-05-19 DIAGNOSIS — E43 Unspecified severe protein-calorie malnutrition: Secondary | ICD-10-CM

## 2014-05-19 DIAGNOSIS — I4891 Unspecified atrial fibrillation: Secondary | ICD-10-CM

## 2014-05-19 DIAGNOSIS — Y9289 Other specified places as the place of occurrence of the external cause: Secondary | ICD-10-CM | POA: Insufficient documentation

## 2014-05-19 DIAGNOSIS — I120 Hypertensive chronic kidney disease with stage 5 chronic kidney disease or end stage renal disease: Secondary | ICD-10-CM | POA: Insufficient documentation

## 2014-05-19 DIAGNOSIS — I951 Orthostatic hypotension: Secondary | ICD-10-CM

## 2014-05-19 DIAGNOSIS — F1410 Cocaine abuse, uncomplicated: Secondary | ICD-10-CM | POA: Diagnosis present

## 2014-05-19 LAB — CBC WITH DIFFERENTIAL/PLATELET
BASOS PCT: 2 % — AB (ref 0–1)
Basophils Absolute: 0.1 10*3/uL (ref 0.0–0.1)
Eosinophils Absolute: 0.2 10*3/uL (ref 0.0–0.7)
Eosinophils Relative: 6 % — ABNORMAL HIGH (ref 0–5)
HEMATOCRIT: 29.9 % — AB (ref 39.0–52.0)
HEMOGLOBIN: 10.1 g/dL — AB (ref 13.0–17.0)
LYMPHS ABS: 1.2 10*3/uL (ref 0.7–4.0)
Lymphocytes Relative: 33 % (ref 12–46)
MCH: 29.4 pg (ref 26.0–34.0)
MCHC: 33.8 g/dL (ref 30.0–36.0)
MCV: 86.9 fL (ref 78.0–100.0)
MONO ABS: 0.4 10*3/uL (ref 0.1–1.0)
MONOS PCT: 12 % (ref 3–12)
NEUTROS ABS: 1.7 10*3/uL (ref 1.7–7.7)
Neutrophils Relative %: 47 % (ref 43–77)
Platelets: 140 10*3/uL — ABNORMAL LOW (ref 150–400)
RBC: 3.44 MIL/uL — AB (ref 4.22–5.81)
RDW: 13.3 % (ref 11.5–15.5)
WBC: 3.5 10*3/uL — ABNORMAL LOW (ref 4.0–10.5)

## 2014-05-19 LAB — I-STAT CHEM 8, ED
BUN: 6 mg/dL (ref 6–23)
CALCIUM ION: 1 mmol/L — AB (ref 1.13–1.30)
Chloride: 95 mEq/L — ABNORMAL LOW (ref 96–112)
Creatinine, Ser: 3.5 mg/dL — ABNORMAL HIGH (ref 0.50–1.35)
GLUCOSE: 90 mg/dL (ref 70–99)
HCT: 33 % — ABNORMAL LOW (ref 39.0–52.0)
Hemoglobin: 11.2 g/dL — ABNORMAL LOW (ref 13.0–17.0)
Potassium: 2.7 mEq/L — CL (ref 3.7–5.3)
Sodium: 141 mEq/L (ref 137–147)
TCO2: 26 mmol/L (ref 0–100)

## 2014-05-19 LAB — I-STAT TROPONIN, ED
Troponin i, poc: 0.01 ng/mL (ref 0.00–0.08)
Troponin i, poc: 0.02 ng/mL (ref 0.00–0.08)

## 2014-05-19 MED ORDER — SODIUM CHLORIDE 0.9 % IJ SOLN: 3.0000 mL | Freq: Two times a day (BID) | INTRAMUSCULAR | Status: DC

## 2014-05-19 MED ORDER — SODIUM CHLORIDE 0.9 % IV SOLN
250.0000 mL | INTRAVENOUS | Status: DC | PRN
Start: 2014-05-19 — End: 2014-05-20

## 2014-05-19 MED ORDER — POTASSIUM CHLORIDE 10 MEQ/100ML IV SOLN
10.0000 meq | INTRAVENOUS | Status: AC
  Administered 2014-05-20 (×2): 10 meq via INTRAVENOUS
  Filled 2014-05-19 (×2): qty 100

## 2014-05-19 MED ORDER — POTASSIUM CHLORIDE CRYS ER 20 MEQ PO TBCR
20.0000 meq | EXTENDED_RELEASE_TABLET | Freq: Once | ORAL | Status: AC
  Administered 2014-05-19: 20 meq via ORAL
  Filled 2014-05-19: qty 1

## 2014-05-19 MED ORDER — TERAZOSIN HCL 2 MG PO CAPS
2.0000 mg | ORAL_CAPSULE | Freq: Every day | ORAL | Status: DC
  Administered 2014-05-20: 2 mg via ORAL
  Filled 2014-05-19 (×2): qty 1

## 2014-05-19 MED ORDER — CALCIUM ACETATE 667 MG PO CAPS
667.0000 mg | ORAL_CAPSULE | Freq: Three times a day (TID) | ORAL | Status: DC
  Administered 2014-05-20: 667 mg via ORAL
  Filled 2014-05-19 (×4): qty 1

## 2014-05-19 MED ORDER — ONDANSETRON HCL 4 MG/2ML IJ SOLN: 4.0000 mg | Freq: Four times a day (QID) | INTRAMUSCULAR | Status: DC | PRN

## 2014-05-19 MED ORDER — AMLODIPINE BESYLATE 10 MG PO TABS
10.0000 mg | ORAL_TABLET | Freq: Every day | ORAL | Status: DC
  Administered 2014-05-20: 10 mg via ORAL
  Filled 2014-05-19: qty 1

## 2014-05-19 MED ORDER — ACETAMINOPHEN 650 MG RE SUPP: 650.0000 mg | Freq: Four times a day (QID) | RECTAL | Status: DC | PRN

## 2014-05-19 MED ORDER — SODIUM CHLORIDE 0.9 % IV BOLUS (SEPSIS): 500.0000 mL | Freq: Once | INTRAVENOUS | Status: DC

## 2014-05-19 MED ORDER — HYDRALAZINE HCL 50 MG PO TABS
50.0000 mg | ORAL_TABLET | Freq: Two times a day (BID) | ORAL | Status: DC
  Administered 2014-05-20 (×2): 50 mg via ORAL
  Filled 2014-05-19 (×3): qty 1

## 2014-05-19 MED ORDER — ENOXAPARIN SODIUM 30 MG/0.3ML ~~LOC~~ SOLN
30.0000 mg | SUBCUTANEOUS | Status: DC
  Administered 2014-05-20: 30 mg via SUBCUTANEOUS
  Filled 2014-05-19 (×2): qty 0.3

## 2014-05-19 MED ORDER — HYDROMORPHONE HCL PF 1 MG/ML IJ SOLN: 0.5000 mg | INTRAMUSCULAR | Status: DC | PRN

## 2014-05-19 MED ORDER — ONDANSETRON HCL 4 MG PO TABS: 4.0000 mg | ORAL_TABLET | Freq: Four times a day (QID) | ORAL | Status: DC | PRN

## 2014-05-19 MED ORDER — SODIUM CHLORIDE 0.9 % IJ SOLN: 3.0000 mL | INTRAMUSCULAR | Status: DC | PRN

## 2014-05-19 MED ORDER — NICOTINE 7 MG/24HR TD PT24
7.0000 mg | MEDICATED_PATCH | Freq: Every day | TRANSDERMAL | Status: DC
  Administered 2014-05-20 (×2): 7 mg via TRANSDERMAL
  Filled 2014-05-19 (×2): qty 1

## 2014-05-19 MED ORDER — ACETAMINOPHEN 325 MG PO TABS: 650.0000 mg | ORAL_TABLET | Freq: Four times a day (QID) | ORAL | Status: DC | PRN

## 2014-05-19 MED ORDER — RENA-VITE PO TABS
1.0000 | ORAL_TABLET | Freq: Every day | ORAL | Status: DC
  Administered 2014-05-20: 1 via ORAL
  Filled 2014-05-19 (×2): qty 1

## 2014-05-19 MED ORDER — OXYCODONE HCL 5 MG PO TABS: 5.0000 mg | ORAL_TABLET | ORAL | Status: DC | PRN

## 2014-05-19 MED ORDER — NIFEDIPINE ER 60 MG PO TB24
60.0000 mg | ORAL_TABLET | Freq: Every day | ORAL | Status: DC
  Administered 2014-05-20: 60 mg via ORAL
  Filled 2014-05-19: qty 1

## 2014-05-19 MED ORDER — DIALYVITE PO TABS: 1.0000 | ORAL_TABLET | Freq: Every day | ORAL | Status: DC

## 2014-05-19 MED ORDER — SODIUM CHLORIDE 0.9 % IJ SOLN
3.0000 mL | Freq: Two times a day (BID) | INTRAMUSCULAR | Status: DC
  Administered 2014-05-20 (×2): 3 mL via INTRAVENOUS

## 2014-05-19 MED ORDER — TAMSULOSIN HCL 0.4 MG PO CAPS
0.4000 mg | ORAL_CAPSULE | Freq: Two times a day (BID) | ORAL | Status: DC
  Administered 2014-05-20 (×2): 0.4 mg via ORAL
  Filled 2014-05-19 (×3): qty 1

## 2014-05-19 NOTE — ED Notes (Signed)
Called secretary to have a dinner tray ordered for the patient.

## 2014-05-19 NOTE — ED Provider Notes (Signed)
Patient's second troponin is still within normal parameters with ambulation.  Patient reports, increased weakness and unsteady gait.  He does not feel that he is ready to go home.  He says this is different than his normal.  Weakness.  Post dialysis Reviewed.  Labs with Dr. Arnoldo Morale, who is admitting the patient.  We agreed to supplement his potassium, which is 2.7, with 20 MeQ  by mouth patient will be admitted to a telemetry bed for observation  Garald Balding, NP 05/19/14 2237  Garald Balding, NP 05/19/14 2237

## 2014-05-19 NOTE — ED Notes (Signed)
Meal was just given to patient

## 2014-05-19 NOTE — ED Notes (Signed)
Asked patient to ambulate he states that he feels weak

## 2014-05-19 NOTE — ED Notes (Addendum)
Pt was asked to ambulate for the 2nd time. He repeated himself three times to Nurse, other nurse tech, and to NP that he was weak and that it was not the normal when he comes from dialysis. He told the Nurse and I to tell the NP that he wants to be admitted to the hospital bc he is very weak. He also stated that he was cramping in his legs and its not normal for him.

## 2014-05-19 NOTE — H&P (Signed)
[facility] History and Physical  Trevor [name] [id number] DOB: [birth year] DOA: 05/19/2014  Referring physician: EDP PCP: [name], MD  Specialists:   Chief Complaint:   Passed Out  HPI: Trevor [name] is a 68 y.o. male with a history of ESRD on HD, Non-Ischemic Cardiomyopathy, HTN,  Who was brought to the Ed emergently after suffering a syncopal episode after dialysis.  He reports after finishing dialysis he was outside waiting for his transportation and sat down to smoke and then he blacked out and fell onto the ground hitting his face.   All he remembers was waking up and "people were working on him".  He reports having left face pain around his eye, and left wrist pain following his fall.   He denies having any prodromal symptoms of dizziness or chest pain or headache.   He reports having cramps in his legs for the past 2-3 days, and he reports having increased sweating since his air conditioning has been broken in his home for the past 2 weeks.   He denies having any fevers or chills or nausea, vomiting or diarrhea.  He was found to have a potassium level of 2.7 in the ED and he was referred for medical admission.     Review of Systems:  Constitutional: No Weight Loss, No Weight Gain, Night Sweats, Fevers, Chills, Fatigue, or Generalized Weakness HEENT: No Headaches, Difficulty Swallowing,Tooth/Dental Problems,Sore Throat,  No Sneezing, Rhinitis, Ear Ache, Nasal Congestion, or Post Nasal Drip,  Cardio-vascular:  No Chest pain, Orthopnea, PND, Edema in lower extremities, Anasarca, Dizziness, Palpitations  Resp: No Dyspnea, No DOE, No Cough, No Hemoptysis, No Wheezing.    GI: No Heartburn, Indigestion, Abdominal Pain, Nausea, Vomiting, Diarrhea, Change in Bowel Habits,  Loss of Appetite  GU: No Dysuria, Change in Color of Urine, No Urgency or Frequency.  No flank pain.  Musculoskeletal: No Joint Pain, + Muscle Cramps,  No Swelling.  No Decreased Range of Motion. No Back  Pain.  Neurologic: +Syncope, No Seizures, Muscle Weakness, Paresthesia, Vision Disturbance or Loss, No Diplopia, No Vertigo, No Difficulty Walking,  Skin: No Rash or Lesions. Psych: No Change in Mood or Affect. No Depression or Anxiety. No Memory loss. No Confusion or Hallucinations   Past Medical History  Diagnosis Date  . HTN (hypertension)   . Anxiety   . Cardiomyopathy   . Renal insufficiency   . Myocardial infarction   . CVA (cerebral infarction)   . Chest pain   . Shortness of breath   . Stroke   . Headache(784.0)   . Arthritis      Past Surgical History  Procedure Laterality Date  . Tee without cardioversion  10/09/2011    Procedure: TRANSESOPHAGEAL ECHOCARDIOGRAM (TEE);  Surgeon: Lelon Perla, MD;  Location: [facility] ENDOSCOPY;  Service: Cardiovascular;  Laterality: N/A;  . Right hand    . Insertion of dialysis catheter  09/21/2012    Procedure: INSERTION OF DIALYSIS CATHETER;  Surgeon: Angelia Mould, MD;  Location: [place];  Service: Vascular;  Laterality: N/A;  Right Internal Jugular Placement      Prior to Admission medications   Medication Sig Start Date End Date Taking? Authorizing Provider  acetaminophen (TYLENOL) 325 MG tablet Take 650 mg by mouth every 6 (six) hours as needed. For pain    Historical Provider, MD  amLODipine (NORVASC) 10 MG tablet Take 10 mg by mouth daily.    Historical Provider, MD  B Complex-C-Folic Acid (DIALYVITE TABLET) TABS Take 1  tablet by mouth daily. 09/28/12   Sorin June Leap, MD  calcium acetate (PHOSLO) 667 MG capsule Take 667 mg by mouth 3 (three) times daily with meals.    Historical Provider, MD  hydrALAZINE (APRESOLINE) 50 MG tablet Take 50 mg by mouth 2 (two) times daily.     Historical Provider, MD  NIFEdipine (PROCARDIA XL/ADALAT-CC) 60 MG 24 hr tablet Take 60 mg by mouth daily.    Historical Provider, MD  tamsulosin (FLOMAX) 0.4 MG CAPS capsule Take 0.4 mg by mouth 2 (two) times daily.    Historical Provider, MD  terazosin  (HYTRIN) 2 MG capsule Take 2 mg by mouth at bedtime.    Historical Provider, MD     No Known Allergies   Social History:  reports that he has been smoking Cigarettes.  He has a 25 pack-year smoking history. He has never used smokeless tobacco. He reports that he drinks alcohol. He reports that he uses illicit drugs ("Crack" cocaine) about twice per week.     Family History  Problem Relation Age of Onset  . Heart attack Mother     MI in her 80s  . Anesthesia problems Neg Hx   . Hypotension Neg Hx   . Malignant hyperthermia Neg Hx   . Pseudochol deficiency Neg Hx       Physical Exam:  GEN: Thin Well Nourished and Developed  68 y.o.African American male examined  and in no acute distress; cooperative with exam Filed Vitals:   05/19/14 2115 05/19/14 2130 05/19/14 2227 05/19/14 2230  BP: 177/78 151/62 188/81 191/86  Pulse: 74  80 62  Temp:      TempSrc:      Resp: 17 21  18   SpO2: 98%  100% 98%   Blood pressure 191/86, pulse 62, temperature 97.5 F (36.4 C), temperature source Oral, resp. rate 18, SpO2 98.00%. PSYCH: He is alert and oriented x4; does not appear anxious does not appear depressed; affect is normal HEENT: Normocephalic and Atraumatic, Mucous membranes pink; PERRLA; EOM intact; Fundi:  Benign;  No scleral icterus, Nares: Patent, Oropharynx: Clear, Fair Dentition, Neck:  FROM, no cervical lymphadenopathy nor thyromegaly or carotid bruit; no JVD; Breasts:: Not examined CHEST WALL: No tenderness CHEST: Normal respiration, clear to auscultation bilaterally HEART: Regular rate and rhythm; no murmurs rubs or gallops BACK: No kyphosis or scoliosis; no CVA tenderness ABDOMEN: Positive Bowel Sounds, Scaphoid, soft non-tender; no masses, no organomegaly. Rectal Exam: Not done EXTREMITIES: No cyanosis, clubbing or edema; no ulcerations. Genitalia: not examined PULSES: 2+ and symmetric SKIN: Normal hydration no rash or ulceration CNS:  Alert and Oriented x 4, No Focal  Deficits.   Vascular: pulses palpable throughout     Labs on Admission:  Basic Metabolic Panel:  Recent Labs Lab 05/19/14 1834  NA 141  K 2.7*  CL 95*  GLUCOSE 90  BUN 6  CREATININE 3.50*   Liver Function Tests: No results found for this basename: AST, ALT, ALKPHOS, BILITOT, PROT, ALBUMIN,  in the last 168 hours No results found for this basename: LIPASE, AMYLASE,  in the last 168 hours No results found for this basename: AMMONIA,  in the last 168 hours CBC:  Recent Labs Lab 05/19/14 1824 05/19/14 1834  WBC 3.5*  --   NEUTROABS 1.7  --   HGB 10.1* 11.2*  HCT 29.9* 33.0*  MCV 86.9  --   PLT 140*  --    Cardiac Enzymes: No results found for this basename: CKTOTAL, CKMB, CKMBINDEX,  TROPONINI,  in the last 168 hours  BNP (last 3 results) No results found for this basename: PROBNP,  in the last 8760 hours CBG: No results found for this basename: GLUCAP,  in the last 168 hours  Radiological Exams on Admission: Ct Head Wo Contrast  05/19/2014   CLINICAL DATA:  Syncope with right-sided headache, left forehead laceration  EXAM: CT HEAD WITHOUT CONTRAST  TECHNIQUE: Contiguous axial images were obtained from the base of the skull through the vertex without intravenous contrast.  COMPARISON:  None.  FINDINGS: Encephalomalacia near the vertex in the left frontoparietal region is stable. Low attenuation in the deep periventricular white matter with evidence of chronic lacunar infarcts in the left corona radiata, stable. Encephalomalacia right occipital lobe unchanged. Stable moderate diffuse cortical atrophy. No evidence of acute vascular territory infarct. No evidence of hemorrhage or extra-axial fluid. No hydrocephalus. Calvarium is intact.  IMPRESSION: Chronic findings with no acute abnormality.   Electronically Signed   By: Skipper Cliche M.D.   On: 05/19/2014 19:09   Dg Chest Port 1 View  05/19/2014   CLINICAL DATA:  Syncope  EXAM: PORTABLE CHEST - 1 VIEW  COMPARISON:   12/12/2013  FINDINGS: Mild cardiac enlargement. Vascular pattern is normal. Lungs are clear. No effusion or pneumothorax.  IMPRESSION: No active disease.   Electronically Signed   By: Skipper Cliche M.D.   On: 05/19/2014 18:12     EKG: Independently reviewed.    Assessment/Plan:   68 y.o. male with  Principal Problem:   Syncope and collapse Active Problems:   Hypokalemia   Tobacco use disorder   Nonischemic cardiomyopathy   Cocaine abuse   Systolic CHF, chronic   CAD (coronary artery disease)   ESRD on hemodialysis   Anemia of renal disease    Facial Abrasions    Left Wrist Pain   1.  Syncope and Collapse-   Syncope Workup Initiated,  Cardiac Monitoring,  Check Orthostatics, Cycle Troponins,    2.   Hypokalemia-   Due to Increased Sweating,  Replete K+ and Check Magnesium level, replete PRN,  Most likely the cause of #1.    3.   Non-Ischemic Cardiomyopathy-   Monitor for s/Sxs of Fluid overload.    4.   Chronic Systolic CHF-   Same as #3.   5.    CAD-  Cycle troponins, and cardiac monitoring.     6.   HTN-   On Amlodipine,  Hydralazine, Terazosin, and Nifedipine,  Monitor BPs, and IV Hydralazine PRN.     7.  ESRD on HD-   On Dialysis Schedule of MWF,   Notify Dialysis in AM.    8.   Anemia of Renal Disease- send Anemia panel,  Monitor Hb trend.     9.  DVT Prophylaxis with Lovenox.      10 .  Facial Abrasions- S/P Fall ,   No Fractures on CT scan of head and Maxillofacial .     11.   Left Wrist pain- Contusion versus Fracture.   X-Rays of Left Wrist ordered to differentiate.       Code Status:   FULL CODE Family Communication:   No Family Present  Disposition Plan:    Inpatient  Time spent:  29 MInutes  [facility] Pager [phone]  If 7PM-7AM, please contact night-coverage [email or web address] Password [email or web address] 05/19/2014, 11:15 PM

## 2014-05-19 NOTE — ED Notes (Signed)
Dr. Maryan Rued would like a dinner tray ordered for the patient, 3 hour troponin drawn, and pt ambulated post eating. If troponin is normal and pt can ambulate, pt may be discharged.

## 2014-05-19 NOTE — ED Provider Notes (Addendum)
CSN: [id number]     Arrival date & time 05/19/14  1735 History   First MD Initiated Contact with Patient 05/19/14 1738     Chief Complaint  Patient presents with  . Loss of Consciousness     (Consider location/radiation/quality/duration/timing/severity/associated sxs/prior Treatment) Patient is a 68 y.o. male presenting with syncope. The history is provided by the patient and the EMS personnel (dialysis facility).  Loss of Consciousness Episode history:  Single Most recent episode:  Today Timing:  Constant Progression:  Improving Chronicity:  New Context comment:  Finished dialysis today and went out to smoke and pt had a syncopal episode while sitting on the bench and fell to the ground hitting his head Witnessed: yes   Relieved by:  Lying down Worsened by:  Nothing tried Associated symptoms: chest pain, confusion and dizziness   Associated symptoms: no diaphoresis, no difficulty breathing, no fever, no focal sensory loss, no focal weakness, no nausea, no palpitations, no shortness of breath and no vomiting   Associated symptoms comment:  Pt has difficulty answering questions and slow to respond with repeated questioning Risk factors: coronary artery disease   Risk factors comment:  ESRD on dialysis   Past Medical History  Diagnosis Date  . HTN (hypertension)   . Anxiety   . Cardiomyopathy   . Renal insufficiency   . Myocardial infarction   . CVA (cerebral infarction)   . Chest pain   . Shortness of breath   . Stroke   . Headache(784.0)   . Arthritis    Past Surgical History  Procedure Laterality Date  . Tee without cardioversion  10/09/2011    Procedure: TRANSESOPHAGEAL ECHOCARDIOGRAM (TEE);  Surgeon: Lelon Perla, MD;  Location: [facility] ENDOSCOPY;  Service: Cardiovascular;  Laterality: N/A;  . Right hand    . Insertion of dialysis catheter  09/21/2012    Procedure: INSERTION OF DIALYSIS CATHETER;  Surgeon: Angelia Mould, MD;  Location: [facility] OR;  Service:  Vascular;  Laterality: N/A;  Right Internal Jugular Placement   Family History  Problem Relation Age of Onset  . Heart attack Mother     MI in her 13s  . Anesthesia problems Neg Hx   . Hypotension Neg Hx   . Malignant hyperthermia Neg Hx   . Pseudochol deficiency Neg Hx    History  Substance Use Topics  . Smoking status: Current Every Day Smoker -- 0.50 packs/day for 50 years    Types: Cigarettes  . Smokeless tobacco: Never Used  . Alcohol Use: Yes     Comment: Occasional    Review of Systems  Constitutional: Negative for fever and diaphoresis.  Respiratory: Negative for shortness of breath.   Cardiovascular: Positive for chest pain and syncope. Negative for palpitations.  Gastrointestinal: Negative for nausea and vomiting.  Neurological: Positive for dizziness. Negative for focal weakness.  Psychiatric/Behavioral: Positive for confusion.  All other systems reviewed and are negative.     Allergies  Review of patient's allergies indicates no known allergies.  Home Medications   Prior to Admission medications   Medication Sig Start Date End Date Taking? Authorizing Provider  acetaminophen (TYLENOL) 325 MG tablet Take 650 mg by mouth every 6 (six) hours as needed. For pain    Historical Provider, MD  amLODipine (NORVASC) 10 MG tablet Take 10 mg by mouth daily.    Historical Provider, MD  B Complex-C-Folic Acid (DIALYVITE TABLET) TABS Take 1 tablet by mouth daily. 09/28/12   Sorin June Leap, MD  calcium acetate (  PHOSLO) 667 MG capsule Take 667 mg by mouth 3 (three) times daily with meals.    Historical Provider, MD  hydrALAZINE (APRESOLINE) 50 MG tablet Take 50 mg by mouth 2 (two) times daily.     Historical Provider, MD  NIFEdipine (PROCARDIA XL/ADALAT-CC) 60 MG 24 hr tablet Take 60 mg by mouth daily.    Historical Provider, MD  tamsulosin (FLOMAX) 0.4 MG CAPS capsule Take 0.4 mg by mouth 2 (two) times daily.    Historical Provider, MD  terazosin (HYTRIN) 2 MG capsule Take 2  mg by mouth at bedtime.    Historical Provider, MD   BP 136/67  Pulse 73  Temp(Src) 97.5 F (36.4 C) (Oral)  Resp 13  SpO2 100% Physical Exam  Nursing note and vitals reviewed. Constitutional: He appears well-developed and well-nourished. No distress.  HENT:  Head: Normocephalic. Head is with abrasion.    Right Ear: Tympanic membrane and ear canal normal.  Left Ear: Tympanic membrane and ear canal normal.  Mouth/Throat: Oropharynx is clear and moist.  Eyes: Conjunctivae and EOM are normal. Pupils are equal, round, and reactive to light.  Neck: Normal range of motion. Neck supple.  Cardiovascular: Normal rate, regular rhythm and intact distal pulses.   No murmur heard. Pulmonary/Chest: Effort normal and breath sounds normal. No respiratory distress. He has no wheezes. He has no rales. He exhibits no tenderness.  Abdominal: Soft. He exhibits no distension. There is no tenderness. There is no rebound and no guarding.  Musculoskeletal: Normal range of motion. He exhibits no edema and no tenderness.       Cervical back: Normal.  Left AV fistula currently cannulated.  No reproducible pain in the shoulders, elbows or wrists  Neurological: No cranial nerve deficit or sensory deficit.  Slow to respond and repeat questioning.  Able to move bilateral upper and lower ext but generalized 4/5 strength  Skin: Skin is warm and dry. No rash noted. No erythema.  Psychiatric: He has a normal mood and affect. His behavior is normal.    ED Course  Procedures (including critical care time) Labs Review Labs Reviewed  CBC WITH DIFFERENTIAL - Abnormal; Notable for the following:    WBC 3.5 (*)    RBC 3.44 (*)    Hemoglobin 10.1 (*)    HCT 29.9 (*)    Platelets 140 (*)    Eosinophils Relative 6 (*)    Basophils Relative 2 (*)    All other components within normal limits  I-STAT CHEM 8, ED - Abnormal; Notable for the following:    Potassium 2.7 (*)    Chloride 95 (*)    Creatinine, Ser 3.50 (*)     Calcium, Ion 1.00 (*)    Hemoglobin 11.2 (*)    HCT 33.0 (*)    All other components within normal limits  I-STAT TROPOININ, ED    Imaging Review Ct Head Wo Contrast  05/19/2014   CLINICAL DATA:  Syncope with right-sided headache, left forehead laceration  EXAM: CT HEAD WITHOUT CONTRAST  TECHNIQUE: Contiguous axial images were obtained from the base of the skull through the vertex without intravenous contrast.  COMPARISON:  None.  FINDINGS: Encephalomalacia near the vertex in the left frontoparietal region is stable. Low attenuation in the deep periventricular white matter with evidence of chronic lacunar infarcts in the left corona radiata, stable. Encephalomalacia right occipital lobe unchanged. Stable moderate diffuse cortical atrophy. No evidence of acute vascular territory infarct. No evidence of hemorrhage or extra-axial fluid. No hydrocephalus.  Calvarium is intact.  IMPRESSION: Chronic findings with no acute abnormality.   Electronically Signed   By: Skipper Cliche M.D.   On: 05/19/2014 19:09   Dg Chest Port 1 View  05/19/2014   CLINICAL DATA:  Syncope  EXAM: PORTABLE CHEST - 1 VIEW  COMPARISON:  12/12/2013  FINDINGS: Mild cardiac enlargement. Vascular pattern is normal. Lungs are clear. No effusion or pneumothorax.  IMPRESSION: No active disease.   Electronically Signed   By: Skipper Cliche M.D.   On: 05/19/2014 18:12     EKG Interpretation   Date/Time:  Friday May 19 2014 17:48:05 EDT Ventricular Rate:  71 PR Interval:  166 QRS Duration: 99 QT Interval:  467 QTC Calculation: 508 R Axis:   31 Text Interpretation:  Sinus rhythm Ventricular premature complex Probable  left atrial enlargement LVH with secondary repolarization abnormality  Prolonged QT interval No significant change since last tracing Confirmed  by Maryan Rued  MD, Loree Fee ([id number]) on 05/19/2014 5:54:40 PM      MDM   Final diagnoses:  Orthostatic syncope    Patient with a syncopal episode today after  dialysis. Facility patient finished dialysis and went out to smoke and had a syncopal episode falling and hitting his head. Upon arrival here patient is slow to respond and seems confused and unable to answer questions appropriately. Patient does have a prior history of syncopal events after dialysis. He is complaining of chest pain but denies any shortness of breath. Current blood pressure is 136/67 with stable vital signs.  Syncope is likely related to volume related cause however patient does have an extensive cardiac history and given complaint of chest pain will further evaluate. Also because patient hit his head and he is not at baseline will do a head CT as well.  EKG without significant change evidence of LVH. Chest x-ray within normal limits. CBC, i-STAT chem 8 and troponin pending  7:12 PM Initially labs and imaging neg.  Pt now feeling much better and asking for food and drink.  At baseline now.  Currently no complaints of chest pain.  Will recheck 3hr troponin and if normal and pt can ambulate without difficulty feel he will be safe for d/c.  This was discussed with pt and he is comfortable with plan.  7:58 PM Pt checked out to [facility] at 2000.  Second trop pending for 9pm  Blanchie Dessert, MD 05/19/14 [id number]  Blanchie Dessert, MD 05/19/14 2000

## 2014-05-19 NOTE — ED Notes (Addendum)
Pt reports to the ED for eval of syncopal episode. Pt had his full dialysis tx and he got back to his dry weight and he then went outside to smoke a cigarette and staff found him on the ground. Abrasions noted to the left side of his face. Bleeding controlled. Pt reports facial pain, intermittent low back pain, right shoulder pain, and subjective sensation of SOB. CMS intact in left arm. Lung sounds clear. Pt NSR on monitor with PVCs. Pt lethargic and falls asleep before answering questions. No other neuro deficits noted. Resp e/u and skin warm and dry.

## 2014-05-20 ENCOUNTER — Observation Stay (HOSPITAL_COMMUNITY): Payer: Medicare HMO

## 2014-05-20 DIAGNOSIS — I517 Cardiomegaly: Secondary | ICD-10-CM

## 2014-05-20 DIAGNOSIS — I4891 Unspecified atrial fibrillation: Secondary | ICD-10-CM

## 2014-05-20 DIAGNOSIS — E43 Unspecified severe protein-calorie malnutrition: Secondary | ICD-10-CM

## 2014-05-20 DIAGNOSIS — I951 Orthostatic hypotension: Secondary | ICD-10-CM

## 2014-05-20 LAB — BASIC METABOLIC PANEL
BUN: 14 mg/dL (ref 6–23)
CALCIUM: 9.2 mg/dL (ref 8.4–10.5)
CO2: 24 meq/L (ref 19–32)
Chloride: 98 mEq/L (ref 96–112)
Creatinine, Ser: 4.89 mg/dL — ABNORMAL HIGH (ref 0.50–1.35)
GFR calc Af Amer: 13 mL/min — ABNORMAL LOW (ref >90)
GFR calc non Af Amer: 11 mL/min — ABNORMAL LOW (ref >90)
GLUCOSE: 77 mg/dL (ref 70–99)
Potassium: 4.7 mEq/L (ref 3.7–5.3)
Sodium: 137 mEq/L (ref 137–147)

## 2014-05-20 LAB — VITAMIN B12: Vitamin B-12: 316 pg/mL (ref 211–911)

## 2014-05-20 LAB — RAPID URINE DRUG SCREEN, HOSP PERFORMED
Amphetamines: NOT DETECTED
BARBITURATES: NOT DETECTED
BENZODIAZEPINES: NOT DETECTED
Cocaine: NOT DETECTED
Opiates: NOT DETECTED
Tetrahydrocannabinol: NOT DETECTED

## 2014-05-20 LAB — TROPONIN I
Troponin I: <0.3 ng/mL (ref <0.30)
Troponin I: <0.3 ng/mL (ref <0.30)
Troponin I: <0.3 ng/mL (ref <0.30)

## 2014-05-20 LAB — CBC
HEMATOCRIT: 30.7 % — AB (ref 39.0–52.0)
Hemoglobin: 10.1 g/dL — ABNORMAL LOW (ref 13.0–17.0)
MCH: 29.2 pg (ref 26.0–34.0)
MCHC: 32.9 g/dL (ref 30.0–36.0)
MCV: 88.7 fL (ref 78.0–100.0)
PLATELETS: 137 10*3/uL — AB (ref 150–400)
RBC: 3.46 MIL/uL — ABNORMAL LOW (ref 4.22–5.81)
RDW: 13.3 % (ref 11.5–15.5)
WBC: 4.2 10*3/uL (ref 4.0–10.5)

## 2014-05-20 LAB — RETICULOCYTES
RBC.: 3.43 MIL/uL — ABNORMAL LOW (ref 4.22–5.81)
Retic Count, Absolute: 61.7 10*3/uL (ref 19.0–186.0)
Retic Ct Pct: 1.8 % (ref 0.4–3.1)

## 2014-05-20 LAB — FERRITIN: Ferritin: 494 ng/mL — ABNORMAL HIGH (ref 22–322)

## 2014-05-20 LAB — IRON AND TIBC
IRON: 63 ug/dL (ref 42–135)
SATURATION RATIOS: 32 % (ref 20–55)
TIBC: 198 ug/dL — ABNORMAL LOW (ref 215–435)
UIBC: 135 ug/dL (ref 125–400)

## 2014-05-20 LAB — MRSA PCR SCREENING: MRSA by PCR: NEGATIVE

## 2014-05-20 LAB — MAGNESIUM: Magnesium: 2.1 mg/dL (ref 1.5–2.5)

## 2014-05-20 LAB — FOLATE

## 2014-05-20 MED ORDER — BOOST / RESOURCE BREEZE PO LIQD
1.0000 | Freq: Three times a day (TID) | ORAL | Status: DC
  Administered 2014-05-20: 1 via ORAL

## 2014-05-20 MED ORDER — BOOST / RESOURCE BREEZE PO LIQD: 1.0000 | Freq: Three times a day (TID) | ORAL | Status: DC

## 2014-05-20 MED ORDER — NEPRO PO LIQD
237.0000 mL | Freq: Two times a day (BID) | ORAL | Status: DC
Start: 2014-05-20 — End: 2015-04-05

## 2014-05-20 MED ORDER — OXYCODONE HCL 5 MG PO TABS: 5.0000 mg | ORAL_TABLET | Freq: Four times a day (QID) | ORAL | Status: DC | PRN

## 2014-05-20 MED ORDER — POTASSIUM CHLORIDE 10 MEQ/100ML IV SOLN
10.0000 meq | Freq: Once | INTRAVENOUS | Status: AC
  Administered 2014-05-20: 10 meq via INTRAVENOUS
  Filled 2014-05-20: qty 100

## 2014-05-20 NOTE — ED Provider Notes (Signed)
Medical screening examination/treatment/procedure(s) were performed by non-physician practitioner and as supervising physician I was immediately available for consultation/collaboration.   EKG Interpretation   Date/Time:  Friday May 19 2014 17:48:05 EDT Ventricular Rate:  71 PR Interval:  166 QRS Duration: 99 QT Interval:  467 QTC Calculation: 508 R Axis:   31 Text Interpretation:  Sinus rhythm Ventricular premature complex Probable  left atrial enlargement LVH with secondary repolarization abnormality  Prolonged QT interval No significant change since last tracing Confirmed  by Maryan Rued  MD, Loree Fee ([id number]) on 05/19/2014 5:54:40 PM        Blanchie Dessert, MD 05/20/14 1259

## 2014-05-20 NOTE — Progress Notes (Signed)
Utilization review complete 

## 2014-05-20 NOTE — Progress Notes (Signed)
INITIAL NUTRITION ASSESSMENT  DOCUMENTATION CODES Per approved criteria  -Severe malnutrition in the context of chronic illness   INTERVENTION: Resource Breeze po TID, each supplement provides 250 kcal and 9 grams of protein  NUTRITION DIAGNOSIS: Malnutrition related to chronic illness  as evidenced by severe fat and muslce wasting.   Goal: Pt to meet >/= 90% of their estimated nutrition needs   Monitor:  PO intake, supplement acceptance, weight trend, labs  Reason for Assessment: Pt identified as at nutrition risk on the Malnutrition Screen Tool  68 y.o. male  Admitting Dx: Syncope and collapse  ASSESSMENT: Pt with hx of ESRD on HD. Pt admitted after syncopal episode.  Per pt his air-conditioner is broken and it is >90 degrees in his apartment. Pt has been feeling weak since this happened and hasn't been eating as well. Pt feels that he has lost weight from 67 kg to 64 kg in the last two weeks.   Nutrition Focused Physical Exam:  Subcutaneous Fat:  Orbital Region: moderate depletion Upper Arm Region: severe depletion Thoracic and Lumbar Region: severe depletion  Muscle:  Temple Region: severe depletion Clavicle Bone Region: severe depletion Clavicle and Acromion Bone Region: severe depletion Scapular Bone Region: severe depeletion Dorsal Hand: severe depletion Patellar Region: severe depletion Anterior Thigh Region: severe depletion Posterior Calf Region: severe depletion  Edema: not present   Height: Ht Readings from Last 1 Encounters:  01/20/13 5\' 10"  (1.778 m)    Weight: Wt Readings from Last 1 Encounters:  05/19/14 148 lb (67.132 kg)    Ideal Body Weight: 75.4 kg   % Ideal Body Weight: 89%  Wt Readings from Last 10 Encounters:  05/19/14 148 lb (67.132 kg)  01/20/13 151 lb 11 oz (68.805 kg)  10/01/12 140 lb 10.5 oz (63.8 kg)  10/01/12 140 lb 10.5 oz (63.8 kg)  10/01/12 140 lb 10.5 oz (63.8 kg)  09/08/12 147 lb 1.9 oz (66.733 kg)  05/08/12 148  lb 12.8 oz (67.495 kg)  11/13/11 146 lb 6.4 oz (66.407 kg)  10/09/11 140 lb (63.504 kg)  10/09/11 140 lb (63.504 kg)    Usual Body Weight: 148 lb   % Usual Body Weight: 100%  BMI:  Body mass index is 21.24 kg/(m^2).  Estimated Nutritional Needs: Kcal: 2000-2200 Protein: 90-100 grams Fluid: >/= 1.2 L/day  Skin: intact  Diet Order: Renal Meal Completion: 100% - this am  EDUCATION NEEDS: -No education needs identified at this time   Intake/Output Summary (Last 24 hours) at 05/20/14 0924 Last data filed at 05/20/14 0807  Gross per 24 hour  Intake    500 ml  Output    425 ml  Net     75 ml    Last BM: PTA   Labs:   Recent Labs Lab 05/19/14 1834 05/19/14 2351 05/20/14 0630  NA 141  --  137  K 2.7*  --  4.7  CL 95*  --  98  CO2  --   --  24  BUN 6  --  14  CREATININE 3.50*  --  4.89*  CALCIUM  --   --  9.2  MG  --  2.1  --   GLUCOSE 90  --  77    CBG (last 3)  No results found for this basename: GLUCAP,  in the last 72 hours  Scheduled Meds: . amLODipine  10 mg Oral Daily  . calcium acetate  667 mg Oral TID WC  . enoxaparin (LOVENOX) injection  30  mg Subcutaneous Q24H  . hydrALAZINE  50 mg Oral BID  . multivitamin  1 tablet Oral QHS  . nicotine  7 mg Transdermal Daily  . NIFEdipine  60 mg Oral Daily  . sodium chloride  500 mL Intravenous Once  . sodium chloride  3 mL Intravenous Q12H  . sodium chloride  3 mL Intravenous Q12H  . tamsulosin  0.4 mg Oral BID  . terazosin  2 mg Oral QHS    Continuous Infusions:   Past Medical History  Diagnosis Date  . HTN (hypertension)   . Anxiety   . Cardiomyopathy   . Renal insufficiency   . Myocardial infarction   . CVA (cerebral infarction)   . Chest pain   . Shortness of breath   . Stroke   . Headache(784.0)   . Arthritis     Past Surgical History  Procedure Laterality Date  . Tee without cardioversion  10/09/2011    Procedure: TRANSESOPHAGEAL ECHOCARDIOGRAM (TEE);  Surgeon: Lelon Perla, MD;   Location: [facility] ENDOSCOPY;  Service: Cardiovascular;  Laterality: N/A;  . Right hand    . Insertion of dialysis catheter  09/21/2012    Procedure: INSERTION OF DIALYSIS CATHETER;  Surgeon: Angelia Mould, MD;  Location: [place];  Service: Vascular;  Laterality: N/A;  Right Internal Jugular Placement    [place], [place], [place] Pager [phone] After Hours Pager

## 2014-05-20 NOTE — Progress Notes (Signed)
  Echocardiogram 2D Echocardiogram has been performed.  Trevor [name] 05/20/2014, 9:19 AM

## 2014-05-20 NOTE — Discharge Summary (Signed)
Physician Discharge Summary  Trevor [name] [id number] DOB: July 14, 1946 DOA: 05/19/2014  PCP: [name], MD  Admit date: 05/19/2014 Discharge date: 05/20/2014  Time spent: >30 minutes  Recommendations for Outpatient Follow-up:  Follow up with cardiology for holter monitoring study  Reassess dry weight and adjust potassium bath and fluid removal during HD Reassess BP and adjust medications as needed  Discharge Diagnoses:  Principal Problem:   Syncope and collapse Active Problems:   Tobacco use disorder   Nonischemic cardiomyopathy   Cocaine abuse   Systolic CHF, chronic   CAD (coronary artery disease)   Hypokalemia   ESRD on hemodialysis   Anemia of renal disease   Discharge Condition: stable and improved. Patient discharged home; will follow with PCP in 10 days and cardiology service will set up appointment for holter monitoring   Diet recommendation: low sodium diet  Filed Weights   05/19/14 2330  Weight: 67.132 kg (148 lb)    History of present illness:  69 y.o. male with a history of ESRD on HD, Non-Ischemic Cardiomyopathy, HTN, Who was brought to the Ed emergently after suffering a syncopal episode after dialysis. He reports after finishing dialysis he was outside waiting for his transportation and sat down to smoke and then he blacked out and fell onto the ground hitting his face. All he remembers was waking up and "people were working on him". He reports having left face pain around his eye, and left wrist pain following his fall. He denies having any prodromal symptoms of dizziness or chest pain or headache. He reports having cramps in his legs for the past 2-3 days, and he reports having increased sweating since his air conditioning has been broken in his home for the past 2 weeks. He denies having any fevers or chills or nausea, vomiting or diarrhea. He was found to have a potassium level of 2.7 in the ED and he was referred for medical admission.   Hospital  Course:  1-Syncope: appears to be secondary to orthostatic changes after to much fluid removed during HD -after gentle hydration and repletion of electrolytes provided; patient was asymptomatic and never experienced any further episode of lightheadedness or syncope -on telemetry we were able to appreciate intermittent episodes of a. Fib with spontaneous conversion to sinus; after discussing with cardiology decision is for holter monitoring study as outpatient to further evaluate any arrythmic component -2-D echo demonstrated severe hypokinesis, EF [id number] and diastolic heart failure -troponin neg X3 -will need better assessment of dry weight during HD treatment and also a higher K bath to avoid hypokalemia  2-wrist and shoulder pain: from fall with syncope episode. -no acute fracture or osteo deformities -PRN oxycodone prescribed for pain -patient with chronic right shoulder degenerative changes and presumably rotator cuff disease  3-HTN: continue home antihypertensive regimen -patient advise to follow low sodium diet  4-ESRD: continue HD M-W-F  5-AOCD: continue aranesp and iron infusion as per renal discretion  6-chronic combined systolic and diastolic heart failure: compensated.  -volume controlled with HD therapy -continue low sodium diet, daily weights and BP controlled.  7-hypokalemia: repleted.   Procedures:  See below for x-ray reports   2-D echo:05/20/14 - Left ventricle: The cavity size was normal. Wall thickness was increased in a pattern of moderate LVH. Systolic function was mildly to moderately reduced. The estimated ejection fraction was 40%. Diffuse hypokinesis. Doppler parameters are consistent with abnormal left ventricular relaxation (grade 1 diastolic dysfunction). - Mitral valve: Calcified annulus. - Left atrium: The atrium  was mildly dilated.  Impressions:  - Mild to moderate reduction in LV function; moderate LVH; grade 1 diastolic  dysfunction.   Consultations:  Renal service  Cardiology contacted to set up outpatient holter monitoring study  Discharge Exam: Filed Vitals:   05/20/14 1000  BP: 147/63  Pulse: 70  Temp: 98.2 F (36.8 C)  Resp: 18    General: feeling ok, no further episodes of syncope; denies CP or SOB Cardiovascular: regular rate; reviewing telemetry he had overnight intermittent episodes of A. Fib; no rubs or gallops Respiratory: CTA Abd: soft, NT, ND, positive BS Neuro: non focal deficit.  Discharge Instructions You were cared for by a hospitalist during your hospital stay. If you have any questions about your discharge medications or the care you received while you were in the hospital after you are discharged, you can call the unit and asked to speak with the hospitalist on call if the hospitalist that took care of you is not available. Once you are discharged, your primary care physician will handle any further medical issues. Please note that NO REFILLS for any discharge medications will be authorized once you are discharged, as it is imperative that you return to your primary care physician (or establish a relationship with a primary care physician if you do not have one) for your aftercare needs so that they can reassess your need for medications and monitor your lab values.  Discharge Instructions   Diet - low sodium heart healthy    Complete by:  As directed      Discharge instructions    Complete by:  As directed   Take medications as prescribed Follow a low sodium diet Keep yourself with adequate hydration Stop Smoking Follow with cardiology service for monitoring holter (office will contact you with appointment details)            Medication List         acetaminophen 325 MG tablet  Commonly known as:  TYLENOL  Take 650 mg by mouth every 6 (six) hours as needed. For pain     amLODipine 10 MG tablet  Commonly known as:  NORVASC  Take 10 mg by mouth daily.      calcium acetate 667 MG capsule  Commonly known as:  PHOSLO  Take 667 mg by mouth 3 (three) times daily with meals.     DIALYVITE TABLET Tabs  Take 1 tablet by mouth daily.     feeding supplement (RESOURCE BREEZE) Liqd  Take 1 Container by mouth 3 (three) times daily between meals.     NEPRO Liqd  Take 237 mLs by mouth 2 (two) times daily.     hydrALAZINE 50 MG tablet  Commonly known as:  APRESOLINE  Take 50 mg by mouth 2 (two) times daily.     NIFEdipine 60 MG 24 hr tablet  Commonly known as:  PROCARDIA XL/ADALAT-CC  Take 60 mg by mouth daily.     oxyCODONE 5 MG immediate release tablet  Commonly known as:  Oxy IR/ROXICODONE  Take 1 tablet (5 mg total) by mouth every 6 (six) hours as needed for severe pain.     tamsulosin 0.4 MG Caps capsule  Commonly known as:  FLOMAX  Take 0.4 mg by mouth 2 (two) times daily.     terazosin 2 MG capsule  Commonly known as:  HYTRIN  Take 2 mg by mouth at bedtime.       No Known Allergies     Follow-up Information  Follow up with COLADONATO,JOSEPH A, MD In 1 week.   Specialty:  Nephrology   Contact information:   [place] [address] [phone]       Follow up with Minus Breeding, MD. (office will contact you with appointment details for holter monitoring study)    Specialty:  Cardiology   Contact information:   [id number] [place] [address] [phone]        The results of significant diagnostics from this hospitalization (including imaging, microbiology, ancillary and laboratory) are listed below for reference.    Significant Diagnostic Studies: Dg Shoulder Right  05/20/2014   CLINICAL DATA:  Status post fall; right shoulder pain.  EXAM: RIGHT SHOULDER - 2+ VIEW  COMPARISON:  Right shoulder radiographs performed 03/28/2010  FINDINGS: There is no evidence of fracture or dislocation. Mild osteophyte formation is noted at the right humeral head. There is mildly worsened degenerative change at  the right acromioclavicular joint. Superior subluxation of the right humeral head raises concern for a chronic rotator cuff tear.  No significant soft tissue abnormalities are seen. The visualized portions of the right lung are clear.  IMPRESSION: 1. No evidence of fracture or dislocation. 2. Mildly worsened degenerative change at the right acromioclavicular joint. 3. Superior subluxation of the right humeral head raises concern for underlying chronic rotator cuff tear.   Electronically Signed   By: Garald Balding M.D.   On: 05/20/2014 07:18   Dg Wrist 2 Views Left  05/20/2014   CLINICAL DATA:  Posterior LEFT wrist pain, fell yesterday, pain throughout hand  EXAM: LEFT WRIST - 2 VIEW  COMPARISON:  None  FINDINGS: Mild osseous demineralization.  Joint spaces preserved.  No acute fracture, dislocation or bone destruction.  IMPRESSION: No acute osseous abnormalities.   Electronically Signed   By: Lavonia Dana M.D.   On: 05/20/2014 09:09   Ct Head Wo Contrast  05/19/2014   CLINICAL DATA:  Syncope with right-sided headache, left forehead laceration  EXAM: CT HEAD WITHOUT CONTRAST  TECHNIQUE: Contiguous axial images were obtained from the base of the skull through the vertex without intravenous contrast.  COMPARISON:  None.  FINDINGS: Encephalomalacia near the vertex in the left frontoparietal region is stable. Low attenuation in the deep periventricular white matter with evidence of chronic lacunar infarcts in the left corona radiata, stable. Encephalomalacia right occipital lobe unchanged. Stable moderate diffuse cortical atrophy. No evidence of acute vascular territory infarct. No evidence of hemorrhage or extra-axial fluid. No hydrocephalus. Calvarium is intact.  IMPRESSION: Chronic findings with no acute abnormality.   Electronically Signed   By: Skipper Cliche M.D.   On: 05/19/2014 19:09   Dg Chest Port 1 View  05/19/2014   CLINICAL DATA:  Syncope  EXAM: PORTABLE CHEST - 1 VIEW  COMPARISON:  12/12/2013   FINDINGS: Mild cardiac enlargement. Vascular pattern is normal. Lungs are clear. No effusion or pneumothorax.  IMPRESSION: No active disease.   Electronically Signed   By: Skipper Cliche M.D.   On: 05/19/2014 18:12    Microbiology: Recent Results (from the past 240 hour(s))  MRSA PCR SCREENING     Status: None   Collection Time    05/19/14 11:48 PM      Result Value Ref Range Status   MRSA by PCR NEGATIVE  NEGATIVE Final   Comment:            The GeneXpert MRSA Assay (FDA     approved for NASAL specimens  only), is one component of a     comprehensive MRSA colonization     surveillance program. It is not     intended to diagnose MRSA     infection nor to guide or     monitor treatment for     MRSA infections.     Labs: Basic Metabolic Panel:  Recent Labs Lab 05/19/14 1834 05/19/14 2351 05/20/14 0630  NA 141  --  137  K 2.7*  --  4.7  CL 95*  --  98  CO2  --   --  24  GLUCOSE 90  --  77  BUN 6  --  14  CREATININE 3.50*  --  4.89*  CALCIUM  --   --  9.2  MG  --  2.1  --    CBC:  Recent Labs Lab 05/19/14 1824 05/19/14 1834 05/20/14 0630  WBC 3.5*  --  4.2  NEUTROABS 1.7  --   --   HGB 10.1* 11.2* 10.1*  HCT 29.9* 33.0* 30.7*  MCV 86.9  --  88.7  PLT 140*  --  137*   Cardiac Enzymes:  Recent Labs Lab 05/19/14 2351 05/20/14 0630  TROPONINI <0.30 <0.30    Signed:  Barton Dubois  [facility] 05/20/2014, 11:20 AM

## 2014-05-20 NOTE — Progress Notes (Signed)
Pt discharged home per MD. NSL out. Discharge instructions provided. Prescription for pain medication given to pt per MD. Pt escorted out via wheelchair and nurse tech. Manya Silvas, RN

## 2014-05-25 ENCOUNTER — Other Ambulatory Visit: Payer: Self-pay | Admitting: *Deleted

## 2014-05-25 DIAGNOSIS — I4891 Unspecified atrial fibrillation: Secondary | ICD-10-CM

## 2014-05-25 DIAGNOSIS — R55 Syncope and collapse: Secondary | ICD-10-CM

## 2014-06-08 ENCOUNTER — Telehealth: Payer: Self-pay | Admitting: Cardiology

## 2014-06-08 NOTE — Telephone Encounter (Signed)
From note sent   Pt needs 48 hr holter for syncope and At Fib - was seen in ED. Pt of Dr Jacalyn Lefevre (last seen 12-12) Will need a follow up appt with him or a APP.    From: Ellwood Dense, RN  Sent: 05/25/2014 12:02 PM  To: [facility]  Subject: holter

## 2014-06-08 NOTE — Telephone Encounter (Signed)
Close patient called while I was doing the telephone note.

## 2014-06-13 ENCOUNTER — Encounter: Payer: Self-pay | Admitting: *Deleted

## 2014-06-13 ENCOUNTER — Encounter (INDEPENDENT_AMBULATORY_CARE_PROVIDER_SITE_OTHER): Payer: Commercial Managed Care - HMO

## 2014-06-13 DIAGNOSIS — R55 Syncope and collapse: Secondary | ICD-10-CM

## 2014-06-13 DIAGNOSIS — I4891 Unspecified atrial fibrillation: Secondary | ICD-10-CM

## 2014-06-13 NOTE — Progress Notes (Signed)
Patient ID: Trevor [name], male   DOB: [birth year], 68 y.o.   MRN: [id number] [facility] 48 hour holter monitor applied to patient.

## 2014-06-23 ENCOUNTER — Telehealth: Payer: Self-pay | Admitting: Radiology

## 2014-06-23 NOTE — Telephone Encounter (Signed)
Called pt and left voicemail that he needs to bring his [employer] holter monitor back. He was due back on 7-16 and we have not received it.

## 2014-06-28 NOTE — Telephone Encounter (Signed)
Left a voice message for this patient to return the [employer] monitor asap. GL

## 2014-07-03 ENCOUNTER — Encounter (HOSPITAL_COMMUNITY): Payer: Self-pay | Admitting: Emergency Medicine

## 2014-07-03 ENCOUNTER — Emergency Department (HOSPITAL_COMMUNITY)
Admission: EM | Admit: 2014-07-03 | Discharge: 2014-07-03 | Disposition: A | Discharge status: Home / Self Care | Payer: Medicare HMO | Attending: Emergency Medicine | Admitting: Emergency Medicine

## 2014-07-03 DIAGNOSIS — F17200 Nicotine dependence, unspecified, uncomplicated: Secondary | ICD-10-CM | POA: Insufficient documentation

## 2014-07-03 DIAGNOSIS — I252 Old myocardial infarction: Secondary | ICD-10-CM | POA: Insufficient documentation

## 2014-07-03 DIAGNOSIS — R404 Transient alteration of awareness: Secondary | ICD-10-CM | POA: Diagnosis present

## 2014-07-03 DIAGNOSIS — Z8659 Personal history of other mental and behavioral disorders: Secondary | ICD-10-CM | POA: Insufficient documentation

## 2014-07-03 DIAGNOSIS — N289 Disorder of kidney and ureter, unspecified: Secondary | ICD-10-CM | POA: Insufficient documentation

## 2014-07-03 DIAGNOSIS — I10 Essential (primary) hypertension: Secondary | ICD-10-CM | POA: Diagnosis not present

## 2014-07-03 DIAGNOSIS — Z992 Dependence on renal dialysis: Secondary | ICD-10-CM | POA: Diagnosis not present

## 2014-07-03 DIAGNOSIS — Z8673 Personal history of transient ischemic attack (TIA), and cerebral infarction without residual deficits: Secondary | ICD-10-CM | POA: Diagnosis not present

## 2014-07-03 DIAGNOSIS — M129 Arthropathy, unspecified: Secondary | ICD-10-CM | POA: Insufficient documentation

## 2014-07-03 DIAGNOSIS — Z79899 Other long term (current) drug therapy: Secondary | ICD-10-CM | POA: Diagnosis not present

## 2014-07-03 DIAGNOSIS — I428 Other cardiomyopathies: Secondary | ICD-10-CM | POA: Diagnosis not present

## 2014-07-03 DIAGNOSIS — R55 Syncope and collapse: Secondary | ICD-10-CM | POA: Diagnosis not present

## 2014-07-03 LAB — CBC WITH DIFFERENTIAL/PLATELET
BASOS ABS: 0.1 10*3/uL (ref 0.0–0.1)
BASOS PCT: 2 % — AB (ref 0–1)
EOS ABS: 0.3 10*3/uL (ref 0.0–0.7)
EOS PCT: 8 % — AB (ref 0–5)
HEMATOCRIT: 33 % — AB (ref 39.0–52.0)
HEMOGLOBIN: 11.2 g/dL — AB (ref 13.0–17.0)
Lymphocytes Relative: 35 % (ref 12–46)
Lymphs Abs: 1.3 10*3/uL (ref 0.7–4.0)
MCH: 29.6 pg (ref 26.0–34.0)
MCHC: 33.9 g/dL (ref 30.0–36.0)
MCV: 87.3 fL (ref 78.0–100.0)
MONO ABS: 0.4 10*3/uL (ref 0.1–1.0)
MONOS PCT: 11 % (ref 3–12)
Neutro Abs: 1.7 10*3/uL (ref 1.7–7.7)
Neutrophils Relative %: 44 % (ref 43–77)
Platelets: 106 10*3/uL — ABNORMAL LOW (ref 150–400)
RBC: 3.78 MIL/uL — ABNORMAL LOW (ref 4.22–5.81)
RDW: 12.7 % (ref 11.5–15.5)
WBC: 3.8 10*3/uL — ABNORMAL LOW (ref 4.0–10.5)

## 2014-07-03 LAB — BASIC METABOLIC PANEL
ANION GAP: 11 (ref 5–15)
BUN: 7 mg/dL (ref 6–23)
CALCIUM: 8.2 mg/dL — AB (ref 8.4–10.5)
CO2: 26 mEq/L (ref 19–32)
CREATININE: 3.12 mg/dL — AB (ref 0.50–1.35)
Chloride: 104 mEq/L (ref 96–112)
GFR, EST AFRICAN AMERICAN: 22 mL/min — AB (ref >90)
GFR, EST NON AFRICAN AMERICAN: 19 mL/min — AB (ref >90)
Glucose, Bld: 82 mg/dL (ref 70–99)
Potassium: 3.1 mEq/L — ABNORMAL LOW (ref 3.7–5.3)
Sodium: 141 mEq/L (ref 137–147)

## 2014-07-03 LAB — I-STAT TROPONIN, ED: Troponin i, poc: 0.02 ng/mL (ref 0.00–0.08)

## 2014-07-03 MED ORDER — SODIUM CHLORIDE 0.9 % IV BOLUS (SEPSIS)
1000.0000 mL | Freq: Once | INTRAVENOUS | Status: AC
  Administered 2014-07-03: 1000 mL via INTRAVENOUS

## 2014-07-03 MED ORDER — POTASSIUM CHLORIDE CRYS ER 20 MEQ PO TBCR
40.0000 meq | EXTENDED_RELEASE_TABLET | Freq: Once | ORAL | Status: AC
  Administered 2014-07-03: 40 meq via ORAL
  Filled 2014-07-03: qty 2

## 2014-07-03 MED ORDER — SODIUM CHLORIDE 0.9 % IV BOLUS (SEPSIS)
500.0000 mL | Freq: Once | INTRAVENOUS | Status: AC
Start: 2014-07-03 — End: 2014-07-03
  Administered 2014-07-03: 500 mL via INTRAVENOUS

## 2014-07-03 NOTE — ED Notes (Signed)
Assisted pt with ambulation in the hallway, pt said he usually walks with a walker and he felt light headed and a little off balance.

## 2014-07-03 NOTE — ED Notes (Signed)
Dr. J at bedside 

## 2014-07-03 NOTE — ED Provider Notes (Signed)
CSN: [id number]     Arrival date & time 07/03/14  1805 History   First MD Initiated Contact with Patient 07/03/14 1807     Chief Complaint  Patient presents with  . Loss of Consciousness     (Consider location/radiation/quality/duration/timing/severity/associated sxs/prior Treatment) HPI Comments: Patient presents emergency department with chief complaint of syncopal episode. He states that after completing his dialysis session this morning, he went home, and became dizzy and lightheaded, and passed out. He states that he was caught by some bystanders, did not fall or hit anything. He denies any pain at this time. He states that he has had several episodes of syncope following dialysis over the past couple of weeks. He denies any chest pain, but does report being short of breath. Denies any nausea, vomiting, or constipation. Additionally he reports some diarrhea for the past couple of days. He denies seeing any blood in his stool.  The history is provided by the patient. No language interpreter was used.    Past Medical History  Diagnosis Date  . HTN (hypertension)   . Anxiety   . Cardiomyopathy   . Renal insufficiency   . Myocardial infarction   . CVA (cerebral infarction)   . Chest pain   . Shortness of breath   . Stroke   . Headache(784.0)   . Arthritis    Past Surgical History  Procedure Laterality Date  . Tee without cardioversion  10/09/2011    Procedure: TRANSESOPHAGEAL ECHOCARDIOGRAM (TEE);  Surgeon: Lelon Perla, MD;  Location: [facility] ENDOSCOPY;  Service: Cardiovascular;  Laterality: N/A;  . Right hand    . Insertion of dialysis catheter  09/21/2012    Procedure: INSERTION OF DIALYSIS CATHETER;  Surgeon: Angelia Mould, MD;  Location: [facility] OR;  Service: Vascular;  Laterality: N/A;  Right Internal Jugular Placement   Family History  Problem Relation Age of Onset  . Heart attack Mother     MI in her 34s  . Anesthesia problems Neg Hx   . Hypotension Neg Hx   .  Malignant hyperthermia Neg Hx   . Pseudochol deficiency Neg Hx    History  Substance Use Topics  . Smoking status: Current Every Day Smoker -- 0.50 packs/day for 50 years    Types: Cigarettes  . Smokeless tobacco: Never Used  . Alcohol Use: No     Comment: Occasional    Review of Systems  All other systems reviewed and are negative.     Allergies  Review of patient's allergies indicates no known allergies.  Home Medications   Prior to Admission medications   Medication Sig Start Date End Date Taking? Authorizing Provider  acetaminophen (TYLENOL) 325 MG tablet Take 650 mg by mouth every 6 (six) hours as needed. For pain    Historical Provider, MD  amLODipine (NORVASC) 10 MG tablet Take 10 mg by mouth daily.    Historical Provider, MD  B Complex-C-Folic Acid (DIALYVITE TABLET) TABS Take 1 tablet by mouth daily. 09/28/12   Sorin June Leap, MD  calcium acetate (PHOSLO) 667 MG capsule Take 667 mg by mouth 3 (three) times daily with meals.    Historical Provider, MD  feeding supplement, RESOURCE BREEZE, (RESOURCE BREEZE) LIQD Take 1 Container by mouth 3 (three) times daily between meals. 05/20/14   Barton Dubois, MD  hydrALAZINE (APRESOLINE) 50 MG tablet Take 50 mg by mouth 2 (two) times daily.     Historical Provider, MD  NIFEdipine (PROCARDIA XL/ADALAT-CC) 60 MG 24 hr tablet Take 60  mg by mouth daily.    Historical Provider, MD  Nutritional Supplements (NEPRO) LIQD Take 237 mLs by mouth 2 (two) times daily. 05/20/14   Barton Dubois, MD  oxyCODONE (OXY IR/ROXICODONE) 5 MG immediate release tablet Take 1 tablet (5 mg total) by mouth every 6 (six) hours as needed for severe pain. 05/20/14   Barton Dubois, MD  tamsulosin (FLOMAX) 0.4 MG CAPS capsule Take 0.4 mg by mouth 2 (two) times daily.    Historical Provider, MD  terazosin (HYTRIN) 2 MG capsule Take 2 mg by mouth at bedtime.    Historical Provider, MD   BP 133/51  Pulse 53  Temp(Src) 97.6 F (36.4 C) (Oral)  Resp 15  Ht 5\' 10"  (1.778  m)  Wt 138 lb 10.7 oz (62.9 kg)  BMI 19.90 kg/m2  SpO2 98% Physical Exam  Nursing note and vitals reviewed. Constitutional: He is oriented to person, place, and time. He appears well-developed and well-nourished.  HENT:  Head: Normocephalic and atraumatic.  Eyes: Conjunctivae and EOM are normal. Pupils are equal, round, and reactive to light. Right eye exhibits no discharge. Left eye exhibits no discharge. No scleral icterus.  Neck: Normal range of motion. Neck supple. No JVD present.  Cardiovascular: Normal rate, regular rhythm and normal heart sounds.  Exam reveals no gallop and no friction rub.   No murmur heard. Left arm AV fistula with palpable thrill  Pulmonary/Chest: Effort normal and breath sounds normal. No respiratory distress. He has no wheezes. He has no rales. He exhibits no tenderness.  Clear to auscultation bilaterally  Abdominal: Soft. He exhibits no distension and no mass. There is no tenderness. There is no rebound and no guarding.  No focal abdominal tenderness, no RLQ tenderness or pain at McBurney's point, no RUQ tenderness or Murphy's sign, no left-sided abdominal tenderness, no fluid wave, or signs of peritonitis   Musculoskeletal: Normal range of motion. He exhibits no edema and no tenderness.  Neurological: He is alert and oriented to person, place, and time.  Skin: Skin is warm and dry.  Psychiatric: He has a normal mood and affect. His behavior is normal. Judgment and thought content normal.    ED Course  Procedures (including critical care time) Results for orders placed during the hospital encounter of 07/03/14  CBC WITH DIFFERENTIAL      Result Value Ref Range   WBC 3.8 (*) 4.0 - 10.5 K/uL   RBC 3.78 (*) 4.22 - 5.81 MIL/uL   Hemoglobin 11.2 (*) 13.0 - 17.0 g/dL   HCT 33.0 (*) 39.0 - 52.0 %   MCV 87.3  78.0 - 100.0 fL   MCH 29.6  26.0 - 34.0 pg   MCHC 33.9  30.0 - 36.0 g/dL   RDW 12.7  11.5 - 15.5 %   Platelets 106 (*) 150 - 400 K/uL   Neutrophils  Relative % 44  43 - 77 %   Neutro Abs 1.7  1.7 - 7.7 K/uL   Lymphocytes Relative 35  12 - 46 %   Lymphs Abs 1.3  0.7 - 4.0 K/uL   Monocytes Relative 11  3 - 12 %   Monocytes Absolute 0.4  0.1 - 1.0 K/uL   Eosinophils Relative 8 (*) 0 - 5 %   Eosinophils Absolute 0.3  0.0 - 0.7 K/uL   Basophils Relative 2 (*) 0 - 1 %   Basophils Absolute 0.1  0.0 - 0.1 K/uL  BASIC METABOLIC PANEL      Result Value Ref  Range   Sodium 141  137 - 147 mEq/L   Potassium 3.1 (*) 3.7 - 5.3 mEq/L   Chloride 104  96 - 112 mEq/L   CO2 26  19 - 32 mEq/L   Glucose, Bld 82  70 - 99 mg/dL   BUN 7  6 - 23 mg/dL   Creatinine, Ser 3.12 (*) 0.50 - 1.35 mg/dL   Calcium 8.2 (*) 8.4 - 10.5 mg/dL   GFR calc non Af Amer 19 (*) >90 mL/min   GFR calc Af Amer 22 (*) >90 mL/min   Anion gap 11  5 - 15  I-STAT TROPOININ, ED      Result Value Ref Range   Troponin i, poc 0.02  0.00 - 0.08 ng/mL   Comment 3               Imaging Review No results found.   EKG Interpretation   Date/Time:  Monday July 03 2014 18:13:54 EDT Ventricular Rate:  50 PR Interval:  165 QRS Duration: 102 QT Interval:  542 QTC Calculation: 494 R Axis:   48 Text Interpretation:  Sinus rhythm Ventricular premature complex LVH with  secondary repolarization abnormality Borderline prolonged QT interval No  significant change since last tracing Confirmed by Winfred Leeds  MD, SAM  [phone]) on 07/03/2014 9:09:40 PM      MDM   Final diagnoses:  Vasovagal syncope    Patient with syncopal episode following dialysis. Will check labs, give fluids, and reevaluate.  Patient with syncopal episode following dialysis. I believe that this was vasovagal in nature. He is feeling much better after fluids in the ED. He is no longer dizzy. Patient seen by and discussed with Dr. Cathleen Fears, who agrees the patient can be discharged home. Patient is stable and ready for discharge. I did supplement the patient's potassium. Additionally, spoke with [facility], who will reevaluate the patient's dialysis settings to hopefully avoid future syncopal episodes.     Medications  sodium chloride 0.9 % bolus 1,000 mL (0 mLs Intravenous Stopped 07/03/14 2125)  potassium chloride SA (K-DUR,KLOR-CON) CR tablet 40 mEq (40 mEq Oral Given 07/03/14 2126)  sodium chloride 0.9 % bolus 500 mL (0 mLs Intravenous Stopped 07/03/14 2201)       Montine Circle, PA-C 07/03/14 2203

## 2014-07-03 NOTE — ED Notes (Addendum)
Per EMS: pt. Was walking down street to gas station and some bystanders noticed him not responding. Pt. Was sat down in his walker and outside gas station when EMS arrived. EMS states that patient was slumped over. EMS had a difficult time locating radial pulses, pt. Diaphoretic. BP 110/60. Pt. Is dialysis patient, went to dialysis today. Pt. States that the last time he felt this way dialysis took too much fluid off. Pt. Has cardiac hx, MI in the past (2012). Pt. States that he doesn't remember everything from the scene. Possible LOC.

## 2014-07-03 NOTE — Discharge Instructions (Signed)
Syncope °Syncope is a medical term for fainting or passing out. This means you lose consciousness and drop to the ground. People are generally unconscious for less than 5 minutes. You may have some muscle twitches for up to 15 seconds before waking up and returning to normal. Syncope occurs more often in older adults, but it can happen to anyone. While most causes of syncope are not dangerous, syncope can be a sign of a serious medical problem. It is important to seek medical care.  °CAUSES  °Syncope is caused by a sudden drop in blood flow to the brain. The specific cause is often not determined. Factors that can bring on syncope include: °· Taking medicines that lower blood pressure. °· Sudden changes in posture, such as standing up quickly. °· Taking more medicine than prescribed. °· Standing in one place for too long. °· Seizure disorders. °· Dehydration and excessive exposure to heat. °· Low blood sugar (hypoglycemia). °· Straining to have a bowel movement. °· Heart disease, irregular heartbeat, or other circulatory problems. °· Fear, emotional distress, seeing blood, or severe pain. °SYMPTOMS  °Right before fainting, you may: °· Feel dizzy or light-headed. °· Feel nauseous. °· See all white or all black in your field of vision. °· Have cold, clammy skin. °DIAGNOSIS  °Your health care provider will ask about your symptoms, perform a physical exam, and perform an electrocardiogram (ECG) to record the electrical activity of your heart. Your health care provider may also perform other heart or blood tests to determine the cause of your syncope which may include: °· Transthoracic echocardiogram (TTE). During echocardiography, sound waves are used to evaluate how blood flows through your heart. °· Transesophageal echocardiogram (TEE). °· Cardiac monitoring. This allows your health care provider to monitor your heart rate and rhythm in real time. °· Holter monitor. This is a portable device that records your  heartbeat and can help diagnose heart arrhythmias. It allows your health care provider to track your heart activity for several days, if needed. °· Stress tests by exercise or by giving medicine that makes the heart beat faster. °TREATMENT  °In most cases, no treatment is needed. Depending on the cause of your syncope, your health care provider may recommend changing or stopping some of your medicines. °HOME CARE INSTRUCTIONS °· Have someone stay with you until you feel stable. °· Do not drive, use machinery, or play sports until your health care provider says it is okay. °· Keep all follow-up appointments as directed by your health care provider. °· Lie down right away if you start feeling like you might faint. Breathe deeply and steadily. Wait until all the symptoms have passed. °· Drink enough fluids to keep your urine clear or pale yellow. °· If you are taking blood pressure or heart medicine, get up slowly and take several minutes to sit and then stand. This can reduce dizziness. °SEEK IMMEDIATE MEDICAL CARE IF:  °· You have a severe headache. °· You have unusual pain in the chest, abdomen, or back. °· You are bleeding from your mouth or rectum, or you have black or tarry stool. °· You have an irregular or very fast heartbeat. °· You have pain with breathing. °· You have repeated fainting or seizure-like jerking during an episode. °· You faint when sitting or lying down. °· You have confusion. °· You have trouble walking. °· You have severe weakness. °· You have vision problems. °If you fainted, call your local emergency services (911 in [place] Do not drive   yourself to the hospital.  °MAKE SURE YOU: °· Understand these instructions. °· Will watch your condition. °· Will get help right away if you are not doing well or get worse. °Document Released: 11/17/2005 Document Revised: 11/22/2013 Document Reviewed: 01/16/2012 °[employer]® Patient Information ©2015 [employer]. This information is not intended to replace  advice given to you by your health care provider. Make sure you discuss any questions you have with your health care provider. ° °

## 2014-07-03 NOTE — ED Provider Notes (Signed)
Patient had syncopal event today 30 minutes after having received hemodialysis. He states that he frequently has syncopal event after hemodialysis. He felt lightheaded today during dialysis. He feels improved muc after treatment with intravenous fluids. He also reports she's had a few episodes of diarrhea recently. Syncope is likely secondary to hypovolemic state  Trevor Dakin, MD 07/03/14 2118

## 2014-07-03 NOTE — ED Notes (Signed)
Pt currently eating sandwich  

## 2014-07-04 NOTE — ED Provider Notes (Signed)
Medical screening examination/treatment/procedure(s) were conducted as a shared visit with non-physician practitioner(s) and myself.  I personally evaluated the patient during the encounter.   EKG Interpretation   Date/Time:  Monday July 03 2014 18:13:54 EDT Ventricular Rate:  50 PR Interval:  165 QRS Duration: 102 QT Interval:  542 QTC Calculation: 494 R Axis:   48 Text Interpretation:  Sinus rhythm Ventricular premature complex LVH with  secondary repolarization abnormality Borderline prolonged QT interval No  significant change since last tracing Confirmed by Winfred Leeds  MD, Lakira Ogando  [phone]) on 07/03/2014 9:09:40 PM       Orlie Dakin, MD 07/04/14 [id number]

## 2014-07-05 ENCOUNTER — Encounter (HOSPITAL_COMMUNITY): Payer: Self-pay | Admitting: Emergency Medicine

## 2014-07-05 ENCOUNTER — Emergency Department (HOSPITAL_COMMUNITY): Payer: Medicare HMO

## 2014-07-05 ENCOUNTER — Inpatient Hospital Stay (HOSPITAL_COMMUNITY)
Admission: EM | Admit: 2014-07-05 | Discharge: 2014-07-11 | DRG: 280 | Disposition: A | Discharge status: Skilled Nursing Facility | Payer: Medicare HMO | Attending: Internal Medicine | Admitting: Internal Medicine

## 2014-07-05 DIAGNOSIS — I509 Heart failure, unspecified: Secondary | ICD-10-CM | POA: Diagnosis present

## 2014-07-05 DIAGNOSIS — I214 Non-ST elevation (NSTEMI) myocardial infarction: Secondary | ICD-10-CM

## 2014-07-05 DIAGNOSIS — Z992 Dependence on renal dialysis: Secondary | ICD-10-CM

## 2014-07-05 DIAGNOSIS — Z91199 Patient's noncompliance with other medical treatment and regimen due to unspecified reason: Secondary | ICD-10-CM

## 2014-07-05 DIAGNOSIS — F1410 Cocaine abuse, uncomplicated: Secondary | ICD-10-CM

## 2014-07-05 DIAGNOSIS — IMO0001 Reserved for inherently not codable concepts without codable children: Secondary | ICD-10-CM | POA: Diagnosis present

## 2014-07-05 DIAGNOSIS — I5023 Acute on chronic systolic (congestive) heart failure: Secondary | ICD-10-CM

## 2014-07-05 DIAGNOSIS — N189 Chronic kidney disease, unspecified: Secondary | ICD-10-CM

## 2014-07-05 DIAGNOSIS — N2581 Secondary hyperparathyroidism of renal origin: Secondary | ICD-10-CM

## 2014-07-05 DIAGNOSIS — E43 Unspecified severe protein-calorie malnutrition: Secondary | ICD-10-CM

## 2014-07-05 DIAGNOSIS — R001 Bradycardia, unspecified: Secondary | ICD-10-CM

## 2014-07-05 DIAGNOSIS — Z9119 Patient's noncompliance with other medical treatment and regimen: Secondary | ICD-10-CM

## 2014-07-05 DIAGNOSIS — D631 Anemia in chronic kidney disease: Secondary | ICD-10-CM

## 2014-07-05 DIAGNOSIS — I252 Old myocardial infarction: Secondary | ICD-10-CM

## 2014-07-05 DIAGNOSIS — R55 Syncope and collapse: Secondary | ICD-10-CM

## 2014-07-05 DIAGNOSIS — N039 Chronic nephritic syndrome with unspecified morphologic changes: Secondary | ICD-10-CM

## 2014-07-05 DIAGNOSIS — I428 Other cardiomyopathies: Secondary | ICD-10-CM

## 2014-07-05 DIAGNOSIS — Z8249 Family history of ischemic heart disease and other diseases of the circulatory system: Secondary | ICD-10-CM

## 2014-07-05 DIAGNOSIS — I4891 Unspecified atrial fibrillation: Secondary | ICD-10-CM | POA: Diagnosis not present

## 2014-07-05 DIAGNOSIS — Z8673 Personal history of transient ischemic attack (TIA), and cerebral infarction without residual deficits: Secondary | ICD-10-CM

## 2014-07-05 DIAGNOSIS — I5042 Chronic combined systolic (congestive) and diastolic (congestive) heart failure: Secondary | ICD-10-CM

## 2014-07-05 DIAGNOSIS — I5043 Acute on chronic combined systolic (congestive) and diastolic (congestive) heart failure: Secondary | ICD-10-CM | POA: Diagnosis not present

## 2014-07-05 DIAGNOSIS — F17200 Nicotine dependence, unspecified, uncomplicated: Secondary | ICD-10-CM

## 2014-07-05 DIAGNOSIS — E876 Hypokalemia: Secondary | ICD-10-CM

## 2014-07-05 DIAGNOSIS — I2510 Atherosclerotic heart disease of native coronary artery without angina pectoris: Secondary | ICD-10-CM

## 2014-07-05 DIAGNOSIS — M899 Disorder of bone, unspecified: Secondary | ICD-10-CM | POA: Diagnosis present

## 2014-07-05 DIAGNOSIS — Z23 Encounter for immunization: Secondary | ICD-10-CM

## 2014-07-05 DIAGNOSIS — Z681 Body mass index (BMI) 19 or less, adult: Secondary | ICD-10-CM

## 2014-07-05 DIAGNOSIS — H548 Legal blindness, as defined in USA: Secondary | ICD-10-CM | POA: Diagnosis present

## 2014-07-05 DIAGNOSIS — R0989 Other specified symptoms and signs involving the circulatory and respiratory systems: Secondary | ICD-10-CM

## 2014-07-05 DIAGNOSIS — N186 End stage renal disease: Secondary | ICD-10-CM | POA: Diagnosis present

## 2014-07-05 DIAGNOSIS — I161 Hypertensive emergency: Secondary | ICD-10-CM

## 2014-07-05 DIAGNOSIS — E41 Nutritional marasmus: Secondary | ICD-10-CM | POA: Diagnosis present

## 2014-07-05 DIAGNOSIS — M949 Disorder of cartilage, unspecified: Secondary | ICD-10-CM

## 2014-07-05 DIAGNOSIS — R0602 Shortness of breath: Secondary | ICD-10-CM | POA: Diagnosis not present

## 2014-07-05 LAB — CBC
HCT: 43.4 % (ref 39.0–52.0)
HEMOGLOBIN: 14.7 g/dL (ref 13.0–17.0)
MCH: 30.5 pg (ref 26.0–34.0)
MCHC: 33.9 g/dL (ref 30.0–36.0)
MCV: 90 fL (ref 78.0–100.0)
PLATELETS: 175 10*3/uL (ref 150–400)
RBC: 4.82 MIL/uL (ref 4.22–5.81)
RDW: 13 % (ref 11.5–15.5)
WBC: 12.6 10*3/uL — ABNORMAL HIGH (ref 4.0–10.5)

## 2014-07-05 LAB — I-STAT ARTERIAL BLOOD GAS, ED
Acid-base deficit: 4 mmol/L — ABNORMAL HIGH (ref 0.0–2.0)
Bicarbonate: 19.8 mEq/L — ABNORMAL LOW (ref 20.0–24.0)
O2 SAT: 91 %
PCO2 ART: 31.7 mmHg — AB (ref 35.0–45.0)
Patient temperature: 98
TCO2: 21 mmol/L (ref 0–100)
pH, Arterial: 7.401 (ref 7.350–7.450)
pO2, Arterial: 58 mmHg — ABNORMAL LOW (ref 80.0–100.0)

## 2014-07-05 LAB — I-STAT CHEM 8, ED
BUN: 31 mg/dL — ABNORMAL HIGH (ref 6–23)
CREATININE: 8 mg/dL — AB (ref 0.50–1.35)
Calcium, Ion: 1.19 mmol/L (ref 1.13–1.30)
Chloride: 110 mEq/L (ref 96–112)
GLUCOSE: 167 mg/dL — AB (ref 70–99)
HCT: 56 % — ABNORMAL HIGH (ref 39.0–52.0)
Hemoglobin: 19 g/dL — ABNORMAL HIGH (ref 13.0–17.0)
POTASSIUM: 4.3 meq/L (ref 3.7–5.3)
Sodium: 143 mEq/L (ref 137–147)
TCO2: 18 mmol/L (ref 0–100)

## 2014-07-05 LAB — BASIC METABOLIC PANEL
Anion gap: 20 — ABNORMAL HIGH (ref 5–15)
BUN: 29 mg/dL — ABNORMAL HIGH (ref 6–23)
CO2: 16 mEq/L — ABNORMAL LOW (ref 19–32)
Calcium: 9.4 mg/dL (ref 8.4–10.5)
Chloride: 105 mEq/L (ref 96–112)
Creatinine, Ser: 7.13 mg/dL — ABNORMAL HIGH (ref 0.50–1.35)
GFR calc Af Amer: 8 mL/min — ABNORMAL LOW (ref >90)
GFR, EST NON AFRICAN AMERICAN: 7 mL/min — AB (ref >90)
GLUCOSE: 161 mg/dL — AB (ref 70–99)
POTASSIUM: 4.5 meq/L (ref 3.7–5.3)
Sodium: 141 mEq/L (ref 137–147)

## 2014-07-05 LAB — PRO B NATRIURETIC PEPTIDE: PRO B NATRI PEPTIDE: 69174 pg/mL — AB (ref 0–125)

## 2014-07-05 LAB — TROPONIN I: TROPONIN I: 0.99 ng/mL — AB (ref <0.30)

## 2014-07-05 LAB — I-STAT TROPONIN, ED: Troponin i, poc: 0.04 ng/mL (ref 0.00–0.08)

## 2014-07-05 MED ORDER — SODIUM CHLORIDE 0.9 % IJ SOLN: 3.0000 mL | Freq: Two times a day (BID) | INTRAMUSCULAR | Status: DC

## 2014-07-05 MED ORDER — SODIUM CHLORIDE 0.9 % IJ SOLN
3.0000 mL | Freq: Two times a day (BID) | INTRAMUSCULAR | Status: DC
  Administered 2014-07-06: 3 mL via INTRAVENOUS

## 2014-07-05 MED ORDER — OXYCODONE HCL 5 MG PO TABS: 5.0000 mg | ORAL_TABLET | Freq: Four times a day (QID) | ORAL | Status: DC | PRN

## 2014-07-05 MED ORDER — DOXERCALCIFEROL 4 MCG/2ML IV SOLN
2.0000 ug | INTRAVENOUS | Status: DC
  Administered 2014-07-10: 2 ug via INTRAVENOUS
  Filled 2014-07-05 (×2): qty 2

## 2014-07-05 MED ORDER — NITROGLYCERIN IN D5W 200-5 MCG/ML-% IV SOLN
2.0000 ug/min | Freq: Once | INTRAVENOUS | Status: AC
  Administered 2014-07-05: 5 ug/min via INTRAVENOUS
  Filled 2014-07-05: qty 250

## 2014-07-05 MED ORDER — HEPARIN (PORCINE) IN NACL 100-0.45 UNIT/ML-% IJ SOLN
950.0000 [IU]/h | INTRAMUSCULAR | Status: DC
  Administered 2014-07-05: 750 [IU]/h via INTRAVENOUS
  Filled 2014-07-05 (×2): qty 250

## 2014-07-05 MED ORDER — HYDROCODONE-ACETAMINOPHEN 5-325 MG PO TABS: 1.0000 | ORAL_TABLET | Freq: Four times a day (QID) | ORAL | Status: DC | PRN

## 2014-07-05 MED ORDER — SODIUM CHLORIDE 0.9 % IJ SOLN: 3.0000 mL | INTRAMUSCULAR | Status: DC | PRN

## 2014-07-05 MED ORDER — TAMSULOSIN HCL 0.4 MG PO CAPS
0.4000 mg | ORAL_CAPSULE | Freq: Every day | ORAL | Status: DC
  Administered 2014-07-06 – 2014-07-11 (×5): 0.4 mg via ORAL
  Filled 2014-07-05 (×8): qty 1

## 2014-07-05 MED ORDER — HEPARIN SODIUM (PORCINE) 5000 UNIT/ML IJ SOLN: 5000.0000 [IU] | Freq: Three times a day (TID) | INTRAMUSCULAR | Status: DC

## 2014-07-05 MED ORDER — ASPIRIN 325 MG PO TABS
325.0000 mg | ORAL_TABLET | Freq: Every day | ORAL | Status: DC
  Administered 2014-07-05 – 2014-07-11 (×6): 325 mg via ORAL
  Filled 2014-07-05 (×7): qty 1

## 2014-07-05 MED ORDER — TETANUS-DIPHTH-ACELL PERTUSSIS 5-2.5-18.5 LF-MCG/0.5 IM SUSP: 0.5000 mL | Freq: Once | INTRAMUSCULAR | Status: DC

## 2014-07-05 MED ORDER — AMLODIPINE BESYLATE 10 MG PO TABS
10.0000 mg | ORAL_TABLET | Freq: Every day | ORAL | Status: DC
  Administered 2014-07-06: 10 mg via ORAL
  Filled 2014-07-05 (×2): qty 1

## 2014-07-05 MED ORDER — NIFEDIPINE ER 60 MG PO TB24
60.0000 mg | ORAL_TABLET | Freq: Every day | ORAL | Status: DC
  Administered 2014-07-06: 60 mg via ORAL
  Filled 2014-07-05 (×2): qty 1

## 2014-07-05 MED ORDER — HEPARIN BOLUS VIA INFUSION
3000.0000 [IU] | Freq: Once | INTRAVENOUS | Status: AC
  Administered 2014-07-05: 3000 [IU] via INTRAVENOUS
  Filled 2014-07-05: qty 3000

## 2014-07-05 MED ORDER — BOOST / RESOURCE BREEZE PO LIQD
1.0000 | Freq: Three times a day (TID) | ORAL | Status: DC
  Administered 2014-07-06 – 2014-07-11 (×8): 1 via ORAL

## 2014-07-05 MED ORDER — TERAZOSIN HCL 2 MG PO CAPS
2.0000 mg | ORAL_CAPSULE | Freq: Every day | ORAL | Status: DC
  Administered 2014-07-06 – 2014-07-10 (×5): 2 mg via ORAL
  Filled 2014-07-05 (×8): qty 1

## 2014-07-05 MED ORDER — HYDRALAZINE HCL 50 MG PO TABS
50.0000 mg | ORAL_TABLET | Freq: Two times a day (BID) | ORAL | Status: DC
  Administered 2014-07-06 (×2): 50 mg via ORAL
  Filled 2014-07-05 (×5): qty 1

## 2014-07-05 MED ORDER — SODIUM CHLORIDE 0.9 % IV SOLN: 250.0000 mL | INTRAVENOUS | Status: DC | PRN

## 2014-07-05 MED ORDER — CALCIUM ACETATE 667 MG PO CAPS
667.0000 mg | ORAL_CAPSULE | Freq: Three times a day (TID) | ORAL | Status: DC
  Administered 2014-07-06 – 2014-07-11 (×12): 667 mg via ORAL
  Filled 2014-07-05 (×20): qty 1

## 2014-07-05 NOTE — ED Notes (Signed)
Pt requesting bi-pap mask to be readjusted; RT aware

## 2014-07-05 NOTE — ED Notes (Signed)
Attempted to give report 

## 2014-07-05 NOTE — Progress Notes (Signed)
ANTICOAGULATION CONSULT NOTE - Initial Consult  Pharmacy Consult for heparin Indication: chest pain/ACS  No Known Allergies  Patient Measurements: Height: 5' 10.08" (178 cm) Weight: 138 lb 10.7 oz (62.9 kg) IBW/kg (Calculated) : 73.18  Vital Signs: Temp: 97.9 F (36.6 C) (08/05 2235) Temp src: Axillary (08/05 2235) BP: 195/100 mmHg (08/05 2235) Pulse Rate: 105 (08/05 2235)  Labs:  Recent Labs  07/03/14 1829 07/05/14 1910 07/05/14 1957 07/05/14 2149  HGB 11.2* 14.7 19.0*  --   HCT 33.0* 43.4 56.0*  --   PLT 106* 175  --   --   CREATININE 3.12* 7.13* 8.00*  --   TROPONINI  --   --   --  0.99*    Estimated Creatinine Clearance: 7.9 ml/min (by C-G formula based on Cr of 8).   Medical History: Past Medical History  Diagnosis Date  . HTN (hypertension)   . Anxiety   . Cardiomyopathy   . Renal insufficiency   . Myocardial infarction   . CVA (cerebral infarction)   . Chest pain   . Shortness of breath   . Stroke   . Headache(784.0)   . Arthritis     Medications:  Prescriptions prior to admission  Medication Sig Dispense Refill  . amLODipine (NORVASC) 10 MG tablet Take 10 mg by mouth daily.      . B Complex-C-Folic Acid (DIALYVITE TABLET) TABS Take 1 tablet by mouth daily.    0  . calcium acetate (PHOSLO) 667 MG capsule Take 667 mg by mouth 3 (three) times daily with meals.      . feeding supplement, RESOURCE BREEZE, (RESOURCE BREEZE) LIQD Take 1 Container by mouth 3 (three) times daily between meals.    0  . hydrALAZINE (APRESOLINE) 50 MG tablet Take 50 mg by mouth 2 (two) times daily.       Marland Kitchen HYDROcodone-acetaminophen (NORCO/VICODIN) 5-325 MG per tablet Take 1 tablet by mouth every 6 (six) hours as needed for moderate pain.      Marland Kitchen NIFEdipine (PROCARDIA XL/ADALAT-CC) 60 MG 24 hr tablet Take 60 mg by mouth daily.      . Nutritional Supplements (NEPRO) LIQD Take 237 mLs by mouth 2 (two) times daily.    0  . OVER THE COUNTER MEDICATION Apply 1 application  topically daily as needed (for shoulder pain).      Marland Kitchen oxyCODONE (OXY IR/ROXICODONE) 5 MG immediate release tablet Take 1 tablet (5 mg total) by mouth every 6 (six) hours as needed for severe pain.  30 tablet  0  . tamsulosin (FLOMAX) 0.4 MG CAPS capsule Take 0.4 mg by mouth daily after breakfast.       . terazosin (HYTRIN) 2 MG capsule Take 2 mg by mouth at bedtime.       Scheduled:  . [START ON 07/06/2014] amLODipine  10 mg Oral Daily  . aspirin  325 mg Oral Daily  . [START ON 07/06/2014] calcium acetate  667 mg Oral TID WC  . [START ON 07/06/2014] feeding supplement (RESOURCE BREEZE)  1 Container Oral TID BM  . hydrALAZINE  50 mg Oral BID  . [START ON 07/06/2014] NIFEdipine  60 mg Oral Daily  . sodium chloride  3 mL Intravenous Q12H  . sodium chloride  3 mL Intravenous Q12H  . [START ON 07/06/2014] tamsulosin  0.4 mg Oral QPC breakfast  . terazosin  2 mg Oral QHS    Assessment: 68yo male c/o SOB and CP, had skipped HD today d/t feeling poor, hypertensive upon  EMS arrival, initial troponin negative, now increasing, to begin heparin.  Goal of Therapy:  Heparin level 0.3-0.7 units/ml Monitor platelets by anticoagulation protocol: Yes   Plan:  Will give heparin 3000 units x1 followed by gtt at 750 units/hr and monitor heparin levels and CBC.  Wynona Neat, PharmD, BCPS  07/05/2014,11:13 PM

## 2014-07-05 NOTE — H&P (Signed)
[facility] History and Physical  LAVOY CUSANO [id number] DOB: 09-Jan-1946 DOA: 07/05/2014  Referring physician: EDP PCP: [name], MD   Chief Complaint: Respiratory distress   HPI: Trevor [name] is a 68 y.o. male h/o ESRD dialysis MWF presents to the ED today in respiratory distress.  He relates that today he "wasnt feeling well" and so skipped his ESRD.  Unfortunatly this caused him to feel progressively worse and he is brought in with low O2 sats, labored breathing, tachypnea, and chest pain / pressure.  Initial BP was 238/147 which has improved to 181/106 with a nitro GTT.  CXR confirms findings of pulmonary edema.  Initial troponin is negative.    Review of Systems: Systems reviewed.  As above, otherwise negative  Past Medical History  Diagnosis Date  . HTN (hypertension)   . Anxiety   . Cardiomyopathy   . Renal insufficiency   . Myocardial infarction   . CVA (cerebral infarction)   . Chest pain   . Shortness of breath   . Stroke   . Headache(784.0)   . Arthritis    Past Surgical History  Procedure Laterality Date  . Tee without cardioversion  10/09/2011    Procedure: TRANSESOPHAGEAL ECHOCARDIOGRAM (TEE);  Surgeon: Lelon Perla, MD;  Location: [facility] ENDOSCOPY;  Service: Cardiovascular;  Laterality: N/A;  . Right hand    . Insertion of dialysis catheter  09/21/2012    Procedure: INSERTION OF DIALYSIS CATHETER;  Surgeon: Angelia Mould, MD;  Location: [place];  Service: Vascular;  Laterality: N/A;  Right Internal Jugular Placement   Social History:  reports that he has been smoking Cigarettes.  He has a 25 pack-year smoking history. He has never used smokeless tobacco. He reports that he does not drink alcohol or use illicit drugs.  No Known Allergies  Family History  Problem Relation Age of Onset  . Heart attack Mother     MI in her 36s  . Anesthesia problems Neg Hx   . Hypotension Neg Hx   . Malignant hyperthermia Neg Hx   .  Pseudochol deficiency Neg Hx      Prior to Admission medications   Medication Sig Start Date End Date Taking? Authorizing Provider  amLODipine (NORVASC) 10 MG tablet Take 10 mg by mouth daily.   Yes Historical Provider, MD  B Complex-C-Folic Acid (DIALYVITE TABLET) TABS Take 1 tablet by mouth daily. 09/28/12  Yes Sorin June Leap, MD  calcium acetate (PHOSLO) 667 MG capsule Take 667 mg by mouth 3 (three) times daily with meals.   Yes Historical Provider, MD  feeding supplement, RESOURCE BREEZE, (RESOURCE BREEZE) LIQD Take 1 Container by mouth 3 (three) times daily between meals. 05/20/14  Yes Barton Dubois, MD  hydrALAZINE (APRESOLINE) 50 MG tablet Take 50 mg by mouth 2 (two) times daily.    Yes Historical Provider, MD  HYDROcodone-acetaminophen (NORCO/VICODIN) 5-325 MG per tablet Take 1 tablet by mouth every 6 (six) hours as needed for moderate pain.   Yes Historical Provider, MD  NIFEdipine (PROCARDIA XL/ADALAT-CC) 60 MG 24 hr tablet Take 60 mg by mouth daily.   Yes Historical Provider, MD  Nutritional Supplements (NEPRO) LIQD Take 237 mLs by mouth 2 (two) times daily. 05/20/14  Yes Barton Dubois, MD  OVER THE COUNTER MEDICATION Apply 1 application topically daily as needed (for shoulder pain).   Yes Historical Provider, MD  oxyCODONE (OXY IR/ROXICODONE) 5 MG immediate release tablet Take 1 tablet (5 mg total) by mouth every 6 (six)  hours as needed for severe pain. 05/20/14  Yes Barton Dubois, MD  tamsulosin (FLOMAX) 0.4 MG CAPS capsule Take 0.4 mg by mouth daily after breakfast.    Yes Historical Provider, MD  terazosin (HYTRIN) 2 MG capsule Take 2 mg by mouth at bedtime.   Yes Historical Provider, MD   Physical Exam: Filed Vitals:   07/05/14 2138  BP: 181/106  Pulse: 104  Temp:   Resp: 20    BP 181/106  Pulse 104  Temp(Src) 98.5 F (36.9 C) (Axillary)  Resp 20  SpO2 99%  General Appearance:    Alert, oriented, no distress, appears stated age  Head:    Normocephalic, atraumatic  Eyes:     PERRL, EOMI, sclera non-icteric        Nose:   Nares without drainage or epistaxis. Mucosa, turbinates normal  Throat:   Moist mucous membranes. Oropharynx without erythema or exudate.  Neck:   Supple. No carotid bruits.  No thyromegaly.  No lymphadenopathy.   Back:     No CVA tenderness, no spinal tenderness  Lungs:     Clear to auscultation bilaterally, without wheezes, rhonchi or rales  Chest wall:    No tenderness to palpitation  Heart:    Regular rate and rhythm without murmurs, gallops, rubs  Abdomen:     Soft, non-tender, nondistended, normal bowel sounds, no organomegaly  Genitalia:    deferred  Rectal:    deferred  Extremities:   No clubbing, cyanosis or edema.  Pulses:   2+ and symmetric all extremities  Skin:   Skin color, texture, turgor normal, no rashes or lesions  Lymph nodes:   Cervical, supraclavicular, and axillary nodes normal  Neurologic:   CNII-XII intact. Normal strength, sensation and reflexes      throughout    Labs on Admission:  Basic Metabolic Panel:  Recent Labs Lab 07/03/14 1829 07/05/14 1910 07/05/14 1957  NA 141 141 143  K 3.1* 4.5 4.3  CL 104 105 110  CO2 26 16*  --   GLUCOSE 82 161* 167*  BUN 7 29* 31*  CREATININE 3.12* 7.13* 8.00*  CALCIUM 8.2* 9.4  --    Liver Function Tests: No results found for this basename: AST, ALT, ALKPHOS, BILITOT, PROT, ALBUMIN,  in the last 168 hours No results found for this basename: LIPASE, AMYLASE,  in the last 168 hours No results found for this basename: AMMONIA,  in the last 168 hours CBC:  Recent Labs Lab 07/03/14 1829 07/05/14 1910 07/05/14 1957  WBC 3.8* 12.6*  --   NEUTROABS 1.7  --   --   HGB 11.2* 14.7 19.0*  HCT 33.0* 43.4 56.0*  MCV 87.3 90.0  --   PLT 106* 175  --    Cardiac Enzymes: No results found for this basename: CKTOTAL, CKMB, CKMBINDEX, TROPONINI,  in the last 168 hours  BNP (last 3 results)  Recent Labs  07/05/14 1910  PROBNP 69174.0*   CBG: No results found for  this basename: GLUCAP,  in the last 168 hours  Radiological Exams on Admission: Dg Chest Port 1 View  07/05/2014   CLINICAL DATA:  Chest pain and short of breath. Skipped dialysis today  EXAM: PORTABLE CHEST - 1 VIEW  COMPARISON:  05/19/2014  FINDINGS: Symmetric bilateral airspace disease has developed since the prior study, with basilar predominance. No significant pleural effusion. Heart size and vascularity appear normal.  IMPRESSION: Symmetric bibasilar airspace disease. Probable pulmonary edema. Pneumonia not excluded.   Electronically Signed  By: Franchot Gallo M.D.   On: 07/05/2014 19:56    EKG: Independently reviewed. ST segment depressions and T wave inversions in the inferior and lateral leads, but these have been present since at least 2013.  Initial EKG also shows a S tach of 155 which has improved down to 104 now that his respiratory distress is improved.  Assessment/Plan Principal Problem:   Hypertensive emergency Active Problems:   Systolic CHF, chronic   ESRD (end stage renal disease)   Acute on chronic systolic CHF (congestive heart failure)   Hypertensive emergency - fluid overload causing acute on chronic systolic CHF and pulmonary edema, understandably his heart which has a baseline EF of 40% as of June of this year is unable to tolerate this fluid overload or produce blood pressures of nearly 240/150.  Patient already has HD orders in system from nephrology, and is headed to the dialysis unit now.  Wean BIPAP and NTG Gtt as tolerated during dialysis.  Serial trops ordered, EKG findings reviewed but ST segment changes are old.  First trop negative, ACS less likely.  Nephrology consulted by EDP and HD orders are in place  Code Status: Full Code  Family Communication: No family in room Disposition Plan: Admit to obs   Time spent: 70 min  GARDNER, JARED M. [facility] Pager [phone]  If 7AM-7PM, please contact the day team taking care of the  patient [email or web address] Password [facility] 07/05/2014, 9:46 PM

## 2014-07-05 NOTE — ED Notes (Signed)
Spoke to Dr.Gardner about elevated troponin; pt began c/o 5/10 chest pain after arriving to the floor. Dr.Gardner aware, orders to be placed

## 2014-07-05 NOTE — Consult Note (Signed)
Reason for Consult: To manage dialysis and dialysis related needs Referring Physician: [name] is an 69 y.o. male with h/o HTN, non ischemic cardiomyopathy as well as ESRD dialysis MWF presents to the ED today in respiratory distress. He relates that today he "wasnt feeling well" and so skipped his dialysis today. Unfortunatly this caused him to feel progressively worse and he is brought in with low O2 sats, labored breathing, tachypnea, and chest pain / pressure.  Blood pressures have been in the 200's- CXR showed pulmonary edema- he has been treated so far with bipap and nitro with reasonable results.   He requires emergent dialysis this evening   Dialyzes at [place] 2K, 2.25 calc, Dialyzer 180, Heparin yes. Access AVF.  Past Medical History  Diagnosis Date  . HTN (hypertension)   . Anxiety   . Cardiomyopathy   . Renal insufficiency   . Myocardial infarction   . CVA (cerebral infarction)   . Chest pain   . Shortness of breath   . Stroke   . Headache(784.0)   . Arthritis     Past Surgical History  Procedure Laterality Date  . Tee without cardioversion  10/09/2011    Procedure: TRANSESOPHAGEAL ECHOCARDIOGRAM (TEE);  Surgeon: Lelon Perla, MD;  Location: [facility] ENDOSCOPY;  Service: Cardiovascular;  Laterality: N/A;  . Right hand    . Insertion of dialysis catheter  09/21/2012    Procedure: INSERTION OF DIALYSIS CATHETER;  Surgeon: Angelia Mould, MD;  Location: [facility] OR;  Service: Vascular;  Laterality: N/A;  Right Internal Jugular Placement    Family History  Problem Relation Age of Onset  . Heart attack Mother     MI in her 4s  . Anesthesia problems Neg Hx   . Hypotension Neg Hx   . Malignant hyperthermia Neg Hx   . Pseudochol deficiency Neg Hx     Social History:  reports that he has been smoking Cigarettes.  He has a 25 pack-year smoking history. He has never used smokeless tobacco. He reports that he does not drink alcohol or use  illicit drugs.  Allergies: No Known Allergies  Medications: I have reviewed the patient's current medications. Hectorol 2 mcg per tx Aranesp 25  Mcg per ts   Results for orders placed during the hospital encounter of 07/05/14 (from the past 48 hour(s))  CBC     Status: Abnormal   Collection Time    07/05/14  7:10 PM      Result Value Ref Range   WBC 12.6 (*) 4.0 - 10.5 K/uL   RBC 4.82  4.22 - 5.81 MIL/uL   Hemoglobin 14.7  13.0 - 17.0 g/dL   HCT 43.4  39.0 - 52.0 %   MCV 90.0  78.0 - 100.0 fL   MCH 30.5  26.0 - 34.0 pg   MCHC 33.9  30.0 - 36.0 g/dL   RDW 13.0  11.5 - 15.5 %   Platelets 175  150 - 400 K/uL  BASIC METABOLIC PANEL     Status: Abnormal   Collection Time    07/05/14  7:10 PM      Result Value Ref Range   Sodium 141  137 - 147 mEq/L   Potassium 4.5  3.7 - 5.3 mEq/L   Chloride 105  96 - 112 mEq/L   CO2 16 (*) 19 - 32 mEq/L   Glucose, Bld 161 (*) 70 - 99 mg/dL   BUN 29 (*) 6 -  23 mg/dL   Creatinine, Ser 7.13 (*) 0.50 - 1.35 mg/dL   Calcium 9.4  8.4 - 10.5 mg/dL   GFR calc non Af Amer 7 (*) >90 mL/min   GFR calc Af Amer 8 (*) >90 mL/min   Comment: (NOTE)     The eGFR has been calculated using the CKD EPI equation.     This calculation has not been validated in all clinical situations.     eGFR's persistently <90 mL/min signify possible Chronic Kidney     Disease.   Anion gap 20 (*) 5 - 15  PRO B NATRIURETIC PEPTIDE     Status: Abnormal   Collection Time    07/05/14  7:10 PM      Result Value Ref Range   Pro B Natriuretic peptide (BNP) 69174.0 (*) 0 - 125 pg/mL  I-STAT TROPOININ, ED     Status: None   Collection Time    07/05/14  7:26 PM      Result Value Ref Range   Troponin i, poc 0.04  0.00 - 0.08 ng/mL   Comment 3            Comment: Due to the release kinetics of cTnI,     a negative result within the first hours     of the onset of symptoms does not rule out     myocardial infarction with certainty.     If myocardial infarction is still  suspected,     repeat the test at appropriate intervals.  I-STAT ARTERIAL BLOOD GAS, ED     Status: Abnormal   Collection Time    07/05/14  7:55 PM      Result Value Ref Range   pH, Arterial 7.401  7.350 - 7.450   pCO2 arterial 31.7 (*) 35.0 - 45.0 mmHg   pO2, Arterial 58.0 (*) 80.0 - 100.0 mmHg   Bicarbonate 19.8 (*) 20.0 - 24.0 mEq/L   TCO2 21  0 - 100 mmol/L   O2 Saturation 91.0     Acid-base deficit 4.0 (*) 0.0 - 2.0 mmol/L   Patient temperature 98.0 F     Collection site ARTERIAL LINE     Drawn by Operator     Sample type ARTERIAL    I-STAT CHEM 8, ED     Status: Abnormal   Collection Time    07/05/14  7:57 PM      Result Value Ref Range   Sodium 143  137 - 147 mEq/L   Potassium 4.3  3.7 - 5.3 mEq/L   Chloride 110  96 - 112 mEq/L   BUN 31 (*) 6 - 23 mg/dL   Creatinine, Ser 8.00 (*) 0.50 - 1.35 mg/dL   Glucose, Bld 167 (*) 70 - 99 mg/dL   Calcium, Ion 1.19  1.13 - 1.30 mmol/L   TCO2 18  0 - 100 mmol/L   Hemoglobin 19.0 (*) 13.0 - 17.0 g/dL   HCT 56.0 (*) 39.0 - 52.0 %  TROPONIN I     Status: Abnormal   Collection Time    07/05/14  9:49 PM      Result Value Ref Range   Troponin I 0.99 (*) <0.30 ng/mL   Comment:            Due to the release kinetics of cTnI,     a negative result within the first hours     of the onset of symptoms does not rule out     myocardial infarction with  certainty.     If myocardial infarction is still suspected,     repeat the test at appropriate intervals.     CRITICAL RESULT CALLED TO, READ BACK BY AND VERIFIED WITH:     [facility] B,RN 07/05/14 [address] [facility] [address] [facility]    Dg Chest Port 1 View  07/05/2014   CLINICAL DATA:  Chest pain and short of breath. Skipped dialysis today  EXAM: PORTABLE CHEST - 1 VIEW  COMPARISON:  05/19/2014  FINDINGS: Symmetric bilateral airspace disease has developed since the prior study, with basilar predominance. No significant pleural effusion. Heart size and vascularity appear normal.  IMPRESSION: Symmetric bibasilar airspace  disease. Probable pulmonary edema. Pneumonia not excluded.   Electronically Signed   By: Franchot Gallo M.D.   On: 07/05/2014 19:56    ROS: difficult to obtain secondary to patient being so SOB- cough and SOB with chest pain- otherwise ROS negative  Blood pressure 195/100, pulse 105, temperature 97.9 F (36.6 C), temperature source Axillary, resp. rate 23, SpO2 94.00%. General appearance: alert and cachectic Resp: diminished breath sounds bibasilar Cardio: S3 present and tachycardic GI: soft, non-tender; bowel sounds normal; no masses,  no organomegaly Extremities: extremities normal, atraumatic, no cyanosis or edema Neurologic: Grossly normal Left upper arm avf with good thrill and bruit    Assessment/Plan: 68 year old BM with ESRD- presenting with SOB/pulmonary edema after missing HD today  1 SOB/pulm edema/HTve urgency- requires emergent dialysis this evening with UF as able- EDW seemed pretty good as OP but likely will need EDW adjustment down at discharge- also with slightly positive troponins- per primary team.  He was supposed to see cardiology tomorrow for follow up on a holter monitor.  Anticipate that once he gets some volume off, his nitro and bipap will be able to be weaned 2 ESRD: normally MWF via AVF at [place]- emergent HD tonight, then watch for further need, may need again before Friday given dramatic presentation 3 Hypertension: volume component, also wonder if drug screen needs to be checked, continue home BP meds once can take POs 4. Anemia of ESRD: Not an issue at this time- normally on low dose aranesp as OP 5. Metabolic Bone Disease: on phoslo as OP, also continue home dose of hectorol    Trevor [name] A 07/05/2014, 10:48 PM

## 2014-07-05 NOTE — ED Notes (Addendum)
Pt to ED from home c/o shortness of breath and chest pain. Reports being seen in the ED on Monday for SOB. Pt called EMS for chest pain and worsening SOB. Pt is a dialysis patient, MWF; last dialysis on Monday; reports "wasn't feeling well and I was too weak." Pt has not taken bp medications today. Pt able to speak in short sentences.

## 2014-07-05 NOTE — ED Provider Notes (Signed)
CSN: [id number]     Arrival date & time 07/05/14  1903 History   First MD Initiated Contact with Patient 07/05/14 1905     Chief Complaint  Patient presents with  . Shortness of Breath  . Chest Pain     (Consider location/radiation/quality/duration/timing/severity/associated sxs/prior Treatment) HPI  Trevor [name] is a 68 y.o. male patient with a past medical history of hypertension, cardiomyopathy, renal failure, CVA, presenting for shortness of breath. Patient states that he normally goes to dialysis Monday Wednesday Friday, however he did not go to dialysis today due to not feeling well. Patient states he felt short of breath since he woke up this morning and felt nauseated. Patient shortness of breath became worse throughout the day until he contacted EMS prior to arrival due to severe shortness of breath. Per EMS patient was very hypertensive, and had loud rales upon their arrival the patient's home. Patient was also hypoxic to 72% on room air. They placed the patient on BiPAP with improvement in his oxygenation.     Past Medical History  Diagnosis Date  . HTN (hypertension)   . Anxiety   . Cardiomyopathy   . Renal insufficiency   . Myocardial infarction   . CVA (cerebral infarction)   . Chest pain   . Shortness of breath   . Stroke   . Headache(784.0)   . Arthritis    Past Surgical History  Procedure Laterality Date  . Tee without cardioversion  10/09/2011    Procedure: TRANSESOPHAGEAL ECHOCARDIOGRAM (TEE);  Surgeon: Lelon Perla, MD;  Location: [facility] ENDOSCOPY;  Service: Cardiovascular;  Laterality: N/A;  . Right hand    . Insertion of dialysis catheter  09/21/2012    Procedure: INSERTION OF DIALYSIS CATHETER;  Surgeon: Angelia Mould, MD;  Location: [facility] OR;  Service: Vascular;  Laterality: N/A;  Right Internal Jugular Placement   Family History  Problem Relation Age of Onset  . Heart attack Mother     MI in her 21s  . Anesthesia problems Neg Hx   .  Hypotension Neg Hx   . Malignant hyperthermia Neg Hx   . Pseudochol deficiency Neg Hx    History  Substance Use Topics  . Smoking status: Current Every Day Smoker -- 0.50 packs/day for 50 years    Types: Cigarettes  . Smokeless tobacco: Never Used  . Alcohol Use: No     Comment: Occasional    Review of Systems  Constitutional: Positive for diaphoresis.  HENT: Negative.   Eyes: Negative.   Respiratory: Positive for shortness of breath.   Cardiovascular: Positive for chest pain.  Gastrointestinal: Negative.   Endocrine: Negative.   Genitourinary: Negative.   Musculoskeletal: Negative.   Skin: Negative.   Allergic/Immunologic: Negative.   Neurological: Negative.   Hematological: Negative.   Psychiatric/Behavioral: The patient is nervous/anxious.       Allergies  Review of patient's allergies indicates no known allergies.  Home Medications   Prior to Admission medications   Medication Sig Start Date End Date Taking? Authorizing Provider  amLODipine (NORVASC) 10 MG tablet Take 10 mg by mouth daily.   Yes Historical Provider, MD  B Complex-C-Folic Acid (DIALYVITE TABLET) TABS Take 1 tablet by mouth daily. 09/28/12  Yes Sorin June Leap, MD  calcium acetate (PHOSLO) 667 MG capsule Take 667 mg by mouth 3 (three) times daily with meals.   Yes Historical Provider, MD  feeding supplement, RESOURCE BREEZE, (RESOURCE BREEZE) LIQD Take 1 Container by mouth 3 (three) times daily  between meals. 05/20/14  Yes Barton Dubois, MD  hydrALAZINE (APRESOLINE) 50 MG tablet Take 50 mg by mouth 2 (two) times daily.    Yes Historical Provider, MD  HYDROcodone-acetaminophen (NORCO/VICODIN) 5-325 MG per tablet Take 1 tablet by mouth every 6 (six) hours as needed for moderate pain.   Yes Historical Provider, MD  NIFEdipine (PROCARDIA XL/ADALAT-CC) 60 MG 24 hr tablet Take 60 mg by mouth daily.   Yes Historical Provider, MD  Nutritional Supplements (NEPRO) LIQD Take 237 mLs by mouth 2 (two) times daily.  05/20/14  Yes Barton Dubois, MD  OVER THE COUNTER MEDICATION Apply 1 application topically daily as needed (for shoulder pain).   Yes Historical Provider, MD  oxyCODONE (OXY IR/ROXICODONE) 5 MG immediate release tablet Take 1 tablet (5 mg total) by mouth every 6 (six) hours as needed for severe pain. 05/20/14  Yes Barton Dubois, MD  tamsulosin (FLOMAX) 0.4 MG CAPS capsule Take 0.4 mg by mouth daily after breakfast.    Yes Historical Provider, MD  terazosin (HYTRIN) 2 MG capsule Take 2 mg by mouth at bedtime.   Yes Historical Provider, MD   BP 214/136  Pulse 113  Temp(Src) 98.5 F (36.9 C) (Axillary)  Resp 26  SpO2 95% Physical Exam  Nursing note and vitals reviewed. Constitutional: He is oriented to person, place, and time. He appears well-developed and well-nourished. He appears distressed.  HENT:  Head: Normocephalic and atraumatic.  Right Ear: External ear normal.  Left Ear: External ear normal.  Nose: Nose normal.  Mouth/Throat: Oropharynx is clear and moist. No oropharyngeal exudate.  Eyes: Conjunctivae and EOM are normal. Pupils are equal, round, and reactive to light. Right eye exhibits no discharge. Left eye exhibits no discharge. No scleral icterus.  Neck: Normal range of motion. Neck supple. No JVD present. No tracheal deviation present. No thyromegaly present.  Cardiovascular: Normal rate, regular rhythm, normal heart sounds and intact distal pulses.  Exam reveals no gallop and no friction rub.   No murmur heard. Pulmonary/Chest: No stridor. He is in respiratory distress. He has no wheezes. He has rales. He exhibits no tenderness.  Abdominal: Soft. He exhibits no distension. There is no tenderness. There is no rebound and no guarding.  Musculoskeletal: Normal range of motion. He exhibits no edema and no tenderness.  Lymphadenopathy:    He has no cervical adenopathy.  Neurological: He is alert and oriented to person, place, and time. He is not disoriented. GCS eye subscore is 4.  GCS verbal subscore is 5. GCS motor subscore is 6.  Skin: Skin is warm. No rash noted. He is diaphoretic. No erythema. No pallor.  Psychiatric: His speech is normal and behavior is normal. Judgment and thought content normal. His mood appears anxious. Cognition and memory are normal.    ED Course  Procedures (including critical care time) Labs Review Labs Reviewed  CBC - Abnormal; Notable for the following:    WBC 12.6 (*)    All other components within normal limits  BASIC METABOLIC PANEL - Abnormal; Notable for the following:    CO2 16 (*)    Glucose, Bld 161 (*)    BUN 29 (*)    Creatinine, Ser 7.13 (*)    GFR calc non Af Amer 7 (*)    GFR calc Af Amer 8 (*)    Anion gap 20 (*)    All other components within normal limits  PRO B NATRIURETIC PEPTIDE - Abnormal; Notable for the following:    Pro B  Natriuretic peptide (BNP) 69174.0 (*)    All other components within normal limits  I-STAT TROPOININ, ED    Imaging Review Dg Chest [facility] 1 View  07/05/2014   CLINICAL DATA:  Chest pain and short of breath. Skipped dialysis today  EXAM: PORTABLE CHEST - 1 VIEW  COMPARISON:  05/19/2014  FINDINGS: Symmetric bilateral airspace disease has developed since the prior study, with basilar predominance. No significant pleural effusion. Heart size and vascularity appear normal.  IMPRESSION: Symmetric bibasilar airspace disease. Probable pulmonary edema. Pneumonia not excluded.   Electronically Signed   By: Franchot Gallo M.D.   On: 07/05/2014 19:56     EKG Interpretation   Date/Time:  Wednesday July 05 2014 19:07:03 EDT Ventricular Rate:  155 PR Interval:  119 QRS Duration: 100 QT Interval:  357 QTC Calculation: 573 R Axis:   46 Text Interpretation:  Supraventricular tachycardia LVH with secondary  repolarization abnormality Confirmed by Kathrynn Humble, MD, Thelma Comp [phone]) on  07/05/2014 7:36:11 PM Also confirmed by Kathrynn Humble, MD, Thelma Comp [phone])  on  07/05/2014 10:10:10 PM      MDM   Final  diagnoses:  Hypertensive emergency  Acute on chronic systolic CHF (congestive heart failure)  ESRD on hemodialysis  Non compliance with medical treatment  NSTEMI (non-ST elevated myocardial infarction)  Chronic combined systolic and diastolic CHF (congestive heart failure)  End-stage renal disease on hemodialysis  Medically noncompliant    Upon arrival to emergency department patient noted to be diaphoretic in significant respiratory distress. Patient appears fluid overloaded on exam with coarse breath sounds in all lung fields. Patient does not have a significant amount of appreciable peripheral edema noted. Patient appears anxious and distressed. Patient started on nitroglycerin drip for management of elevated blood pressure and acute pulmonary edema. Chest x-ray and laboratory workup ordered. Patient has signs of pulmonary edema on chest x-ray. Blood pressure improved on nitroglycerin drip with improvement in oxygen saturation on BiPAP. Patient will need urgent dialysis due to his fluid overloaded status. Laboratory workup reviewed as above. Patient will be admitted for further management with transport to dialysis in the interim.  Pt states that chest pain has resolved with nitroglycerin and BP control.  He states that his breathing feels easier as well.  Breath sounds improved.  Pt transferred to inpatient service in hemodynamically stable condition.  Patient care was discussed with my attending, Dr. Kathrynn Humble.     Hoyle Sauer, MD 07/06/14 [phone]

## 2014-07-05 NOTE — ED Notes (Addendum)
Dr.Nanavati at bedside; Nitro rate changed to 55mcg/min; Goal MAP 120 per MD

## 2014-07-05 NOTE — Significant Event (Addendum)
Troponin now turned positive!  Heparin gtt started, NPO, Calling cards.  Spoke with Dr. Inda Castle regarding consult on patient.

## 2014-07-05 NOTE — ED Notes (Signed)
Dr.Gardner paged to inform about abnormal labs

## 2014-07-05 NOTE — ED Notes (Signed)
Per EMS: pt coming from home with c/o shortness of breath and chest pain. Onset today. Pt is a dialysis pt, skipped today because he didn't feel well. Upon EMS arrival pt oxygen saturation was 78% on RA, labored, tachypnic, audible rales, speaking in one word sentences, CPAP placed on pt with an O2 sat increase to 87%. Pt is A&Ox4, respirations labored, tachypnic, skin warm and diaphoretic. Pt also very hypertensive on scene with FIRE and EMS

## 2014-07-06 ENCOUNTER — Encounter (HOSPITAL_COMMUNITY): Payer: Self-pay

## 2014-07-06 ENCOUNTER — Ambulatory Visit: Payer: Medicare HMO | Admitting: Cardiology

## 2014-07-06 DIAGNOSIS — I10 Essential (primary) hypertension: Secondary | ICD-10-CM

## 2014-07-06 DIAGNOSIS — D631 Anemia in chronic kidney disease: Secondary | ICD-10-CM | POA: Diagnosis present

## 2014-07-06 DIAGNOSIS — IMO0001 Reserved for inherently not codable concepts without codable children: Secondary | ICD-10-CM | POA: Diagnosis present

## 2014-07-06 DIAGNOSIS — Z681 Body mass index (BMI) 19 or less, adult: Secondary | ICD-10-CM | POA: Diagnosis not present

## 2014-07-06 DIAGNOSIS — I5043 Acute on chronic combined systolic (congestive) and diastolic (congestive) heart failure: Secondary | ICD-10-CM | POA: Diagnosis present

## 2014-07-06 DIAGNOSIS — R0602 Shortness of breath: Secondary | ICD-10-CM | POA: Diagnosis present

## 2014-07-06 DIAGNOSIS — I428 Other cardiomyopathies: Secondary | ICD-10-CM | POA: Diagnosis present

## 2014-07-06 DIAGNOSIS — Z992 Dependence on renal dialysis: Secondary | ICD-10-CM | POA: Diagnosis not present

## 2014-07-06 DIAGNOSIS — N186 End stage renal disease: Secondary | ICD-10-CM | POA: Diagnosis present

## 2014-07-06 DIAGNOSIS — I5042 Chronic combined systolic (congestive) and diastolic (congestive) heart failure: Secondary | ICD-10-CM

## 2014-07-06 DIAGNOSIS — F17200 Nicotine dependence, unspecified, uncomplicated: Secondary | ICD-10-CM | POA: Diagnosis present

## 2014-07-06 DIAGNOSIS — Z8249 Family history of ischemic heart disease and other diseases of the circulatory system: Secondary | ICD-10-CM | POA: Diagnosis not present

## 2014-07-06 DIAGNOSIS — Z8673 Personal history of transient ischemic attack (TIA), and cerebral infarction without residual deficits: Secondary | ICD-10-CM | POA: Diagnosis not present

## 2014-07-06 DIAGNOSIS — M899 Disorder of bone, unspecified: Secondary | ICD-10-CM | POA: Diagnosis present

## 2014-07-06 DIAGNOSIS — I252 Old myocardial infarction: Secondary | ICD-10-CM | POA: Diagnosis not present

## 2014-07-06 DIAGNOSIS — I4891 Unspecified atrial fibrillation: Secondary | ICD-10-CM | POA: Diagnosis not present

## 2014-07-06 DIAGNOSIS — Z23 Encounter for immunization: Secondary | ICD-10-CM | POA: Diagnosis not present

## 2014-07-06 DIAGNOSIS — I214 Non-ST elevation (NSTEMI) myocardial infarction: Secondary | ICD-10-CM | POA: Diagnosis present

## 2014-07-06 DIAGNOSIS — I509 Heart failure, unspecified: Secondary | ICD-10-CM

## 2014-07-06 DIAGNOSIS — M949 Disorder of cartilage, unspecified: Secondary | ICD-10-CM | POA: Diagnosis present

## 2014-07-06 DIAGNOSIS — I2510 Atherosclerotic heart disease of native coronary artery without angina pectoris: Secondary | ICD-10-CM | POA: Diagnosis present

## 2014-07-06 DIAGNOSIS — H548 Legal blindness, as defined in USA: Secondary | ICD-10-CM | POA: Diagnosis present

## 2014-07-06 DIAGNOSIS — E41 Nutritional marasmus: Secondary | ICD-10-CM | POA: Diagnosis present

## 2014-07-06 DIAGNOSIS — I5023 Acute on chronic systolic (congestive) heart failure: Secondary | ICD-10-CM

## 2014-07-06 DIAGNOSIS — Z91199 Patient's noncompliance with other medical treatment and regimen due to unspecified reason: Secondary | ICD-10-CM | POA: Diagnosis not present

## 2014-07-06 DIAGNOSIS — Z9119 Patient's noncompliance with other medical treatment and regimen: Secondary | ICD-10-CM | POA: Diagnosis not present

## 2014-07-06 LAB — BASIC METABOLIC PANEL
ANION GAP: 17 — AB (ref 5–15)
BUN: 10 mg/dL (ref 6–23)
CHLORIDE: 98 meq/L (ref 96–112)
CO2: 26 mEq/L (ref 19–32)
CREATININE: 2.74 mg/dL — AB (ref 0.50–1.35)
Calcium: 9.4 mg/dL (ref 8.4–10.5)
GFR, EST AFRICAN AMERICAN: 26 mL/min — AB (ref >90)
GFR, EST NON AFRICAN AMERICAN: 22 mL/min — AB (ref >90)
Glucose, Bld: 79 mg/dL (ref 70–99)
Potassium: 3.4 mEq/L — ABNORMAL LOW (ref 3.7–5.3)
Sodium: 141 mEq/L (ref 137–147)

## 2014-07-06 LAB — MRSA PCR SCREENING: MRSA by PCR: NEGATIVE

## 2014-07-06 LAB — CBC
HEMATOCRIT: 37.8 % — AB (ref 39.0–52.0)
Hemoglobin: 13.1 g/dL (ref 13.0–17.0)
MCH: 30.3 pg (ref 26.0–34.0)
MCHC: 34.7 g/dL (ref 30.0–36.0)
MCV: 87.3 fL (ref 78.0–100.0)
PLATELETS: 142 10*3/uL — AB (ref 150–400)
RBC: 4.33 MIL/uL (ref 4.22–5.81)
RDW: 12.9 % (ref 11.5–15.5)
WBC: 7.3 10*3/uL (ref 4.0–10.5)

## 2014-07-06 LAB — PROTIME-INR
INR: 1.11 (ref 0.00–1.49)
Prothrombin Time: 14.3 seconds (ref 11.6–15.2)

## 2014-07-06 LAB — GLUCOSE, CAPILLARY: GLUCOSE-CAPILLARY: 88 mg/dL (ref 70–99)

## 2014-07-06 LAB — TROPONIN I
Troponin I: 0.98 ng/mL (ref <0.30)
Troponin I: 0.85 ng/mL (ref <0.30)

## 2014-07-06 LAB — HEPARIN LEVEL (UNFRACTIONATED): HEPARIN UNFRACTIONATED: 0.14 [IU]/mL — AB (ref 0.30–0.70)

## 2014-07-06 MED ORDER — ASPIRIN 81 MG PO CHEW
81.0000 mg | CHEWABLE_TABLET | ORAL | Status: AC
  Administered 2014-07-07: 81 mg via ORAL
  Filled 2014-07-06: qty 1

## 2014-07-06 MED ORDER — HEPARIN (PORCINE) IN NACL 100-0.45 UNIT/ML-% IJ SOLN
1250.0000 [IU]/h | INTRAMUSCULAR | Status: DC
  Administered 2014-07-06: 1150 [IU]/h via INTRAVENOUS
  Administered 2014-07-07: 1250 [IU]/h via INTRAVENOUS
  Filled 2014-07-06 (×2): qty 250

## 2014-07-06 MED ORDER — SODIUM CHLORIDE 0.9 % IJ SOLN
3.0000 mL | Freq: Two times a day (BID) | INTRAMUSCULAR | Status: DC
  Administered 2014-07-06: 3 mL via INTRAVENOUS

## 2014-07-06 MED ORDER — SODIUM CHLORIDE 0.9 % IV SOLN: 250.0000 mL | INTRAVENOUS | Status: DC | PRN

## 2014-07-06 MED ORDER — MORPHINE SULFATE 2 MG/ML IJ SOLN
2.0000 mg | Freq: Once | INTRAMUSCULAR | Status: AC
  Administered 2014-07-06: 2 mg via INTRAVENOUS
  Filled 2014-07-06: qty 1

## 2014-07-06 MED ORDER — CETYLPYRIDINIUM CHLORIDE 0.05 % MT LIQD
7.0000 mL | Freq: Two times a day (BID) | OROMUCOSAL | Status: DC
  Administered 2014-07-06 – 2014-07-11 (×6): 7 mL via OROMUCOSAL

## 2014-07-06 NOTE — Progress Notes (Signed)
ANTICOAGULATION CONSULT NOTE - Initial Consult  Pharmacy Consult for heparin Indication: chest pain/ACS  No Known Allergies  Patient Measurements: Height: 5\' 10"  (177.8 cm) Weight: 131 lb 13.4 oz (59.8 kg) IBW/kg (Calculated) : 73  Vital Signs: Temp: 98.5 F (36.9 C) (08/06 1106) Temp src: Oral (08/06 1106) BP: 127/64 mmHg (08/06 1200) Pulse Rate: 84 (08/06 1200)  Labs:  Recent Labs  07/03/14 1829 07/05/14 1910 07/05/14 1957 07/05/14 2149 07/06/14 0422 07/06/14 0944 07/06/14 1200  HGB 11.2* 14.7 19.0*  --  13.1  --   --   HCT 33.0* 43.4 56.0*  --  37.8*  --   --   PLT 106* 175  --   --  142*  --   --   LABPROT  --   --   --   --   --   --  14.3  INR  --   --   --   --   --   --  1.11  HEPARINUNFRC  --   --   --   --   --   --  <0.10*  CREATININE 3.12* 7.13* 8.00*  --  2.74*  --   --   TROPONINI  --   --   --  0.99* 0.98* 0.85*  --     Estimated Creatinine Clearance: 21.8 ml/min (by C-G formula based on Cr of 2.74).   Medical History: Past Medical History  Diagnosis Date  . HTN (hypertension)   . Anxiety   . Cardiomyopathy   . Renal insufficiency   . Myocardial infarction   . CVA (cerebral infarction)   . Chest pain   . Shortness of breath   . Stroke   . Headache(784.0)   . Arthritis     Medications:  Prescriptions prior to admission  Medication Sig Dispense Refill  . amLODipine (NORVASC) 10 MG tablet Take 10 mg by mouth daily.      . B Complex-C-Folic Acid (DIALYVITE TABLET) TABS Take 1 tablet by mouth daily.    0  . calcium acetate (PHOSLO) 667 MG capsule Take 667 mg by mouth 3 (three) times daily with meals.      . feeding supplement, RESOURCE BREEZE, (RESOURCE BREEZE) LIQD Take 1 Container by mouth 3 (three) times daily between meals.    0  . hydrALAZINE (APRESOLINE) 50 MG tablet Take 50 mg by mouth 2 (two) times daily.       Marland Kitchen HYDROcodone-acetaminophen (NORCO/VICODIN) 5-325 MG per tablet Take 1 tablet by mouth every 6 (six) hours as needed for  moderate pain.      Marland Kitchen NIFEdipine (PROCARDIA XL/ADALAT-CC) 60 MG 24 hr tablet Take 60 mg by mouth daily.      . Nutritional Supplements (NEPRO) LIQD Take 237 mLs by mouth 2 (two) times daily.    0  . OVER THE COUNTER MEDICATION Apply 1 application topically daily as needed (for shoulder pain).      Marland Kitchen oxyCODONE (OXY IR/ROXICODONE) 5 MG immediate release tablet Take 1 tablet (5 mg total) by mouth every 6 (six) hours as needed for severe pain.  30 tablet  0  . tamsulosin (FLOMAX) 0.4 MG CAPS capsule Take 0.4 mg by mouth daily after breakfast.       . terazosin (HYTRIN) 2 MG capsule Take 2 mg by mouth at bedtime.       Scheduled:  . amLODipine  10 mg Oral Daily  . antiseptic oral rinse  7 mL Mouth Rinse BID  . [  START ON 07/07/2014] aspirin  81 mg Oral Pre-Cath  . aspirin  325 mg Oral Daily  . calcium acetate  667 mg Oral TID WC  . [START ON 07/07/2014] doxercalciferol  2 mcg Intravenous Q M,W,F-HD  . feeding supplement (RESOURCE BREEZE)  1 Container Oral TID BM  . hydrALAZINE  50 mg Oral BID  . NIFEdipine  60 mg Oral Daily  . sodium chloride  3 mL Intravenous Q12H  . sodium chloride  3 mL Intravenous Q12H  . sodium chloride  3 mL Intravenous Q12H  . tamsulosin  0.4 mg Oral QPC breakfast  . terazosin  2 mg Oral QHS    Assessment: 68 yo male c/o SOB and CP, skipped HD yesterday d/t feeling poor, hypertensive upon EMS arrival, initial troponin negative then began increasing.  Heparin level subtherapeutic.  CBC is stable, plts borderline low, pt is ESRD on HD.  Goal of Therapy:  Heparin level 0.3-0.7 units/ml Monitor platelets by anticoagulation protocol: Yes   Plan:  Incr heparin to 950 units/hr Daily HL, CBC F/u after cath   Hughes Better, PharmD, BCPS Clinical Pharmacist Pager: [phone] 07/06/2014 2:15 PM

## 2014-07-06 NOTE — Consult Note (Signed)
Cardiology Consultation Note  Patient ID: Trevor [name], MRN: [id number], DOB/AGE: 1946-03-29 68 y.o. Admit date: 07/05/2014   Date of Consult: 07/06/2014 Primary Physician: [name], MD Primary Cardiologist: Kirk Ruths & Ermalinda Barrios   Chief Complaint: SOB  Reason for Consult: Elevated troponin    Assessment and Plan:  Acute on Chronic systolic CHF Hypertension uncontrolled  ESRD on HD  NSTEMI type I vs II (  Eventhough pt has multiple reasons for Troponin leak from demand ischemia related to uncontrolled BP and ESRD, he also has pior depressed LV function,  abnormal stress test and uncontrolled risk factors)   Plan  -Monitor on tele  -Cont Heparin , aspirin  -Check lipids and consider statin  - unclear why pt is on nifidepine and amlodipine. Would stop one. Consider B-blocker once more compensated -Can stop nitro gtt since BP significantly improved  -Trend CE , may need LHC pior to d/c , NPO after midnight    68 year old [place] American male patient who has history of end-stage renal disease, HTN , ESRD on HD, CVA , smoking,  cardiomyopathy ejection fraction 40% ,  legally blind with ass hx of medication noncompliance  here with SOB   Pt states that 2 days ago he was not feeling well and did not take his BP pills and missed his hemodialysis. He began to have progressive SOB to the point that he had to call the EMS. Reports concomittant cough with phlegm ( pt is unable to see and hence and tell me the color of the phlegm ) . No fever. Had transient Chest pain after arrival to the ER described as substernal pressure radiating to the epigastrium . In the  ED BP was 231/127 mm Hg and he started on Nitro gtt , BiPAP and plans for urgent Hemodialysis. Currently he feels better in terms of his breathing.   Chart review shows that :  myoview in September 2012 revealing apical thinning and global hypokinesis, Previously Dr.  Karie Fetch catheterization because of his  kidney disease He has had drug screen positive for cocaine in the past  Smokes 1-2 cigg / day  Renal Doppler 09/2011 with no RAS Carotid dopplers in Nov 2012 showed no significant stenosis bilaterally    Past Medical History  Diagnosis Date  . HTN (hypertension)   . Anxiety   . Cardiomyopathy   . Renal insufficiency   . Myocardial infarction   . CVA (cerebral infarction)   . Chest pain   . Shortness of breath   . Stroke   . Headache(784.0)   . Arthritis       Most Recent Cardiac Studies: Echo 05/2014 - Left ventricle: The cavity size was normal. Wall thickness was increased in a pattern of moderate LVH. Systolic function was mildly to moderately reduced. The estimated ejection fraction was 40%. Diffuse hypokinesis. Doppler parameters are consistent with abnormal left ventricular relaxation (grade 1 diastolic dysfunction). - Mitral valve: Calcified annulus. - Left atrium: The atrium was mildly dilated.  Impressions:  - Mild to moderate reduction in LV function; moderate LVH; grade 1 diastolic dysfunction.  Lexi Myoview 08/2011 Impression  Exercise Capacity: Lexiscan with no exercise.  BP Response: Normal blood pressure response.  Clinical Symptoms: No chest pain.  ECG Impression: Baseline: LVH. EKG uninterpretable due to LVH at rest and stress.  Comparison with Prior Nuclear Study: No images to compare  Overall Impression: Abnormal stress nuclear study with apical thinning and global hypokinesis.    Surgical History:  Past  Surgical History  Procedure Laterality Date  . Tee without cardioversion  10/09/2011    Procedure: TRANSESOPHAGEAL ECHOCARDIOGRAM (TEE);  Surgeon: Lelon Perla, MD;  Location: [facility] ENDOSCOPY;  Service: Cardiovascular;  Laterality: N/A;  . Right hand    . Insertion of dialysis catheter  09/21/2012    Procedure: INSERTION OF DIALYSIS CATHETER;  Surgeon: Angelia Mould, MD;  Location: [place];  Service: Vascular;  Laterality: N/A;  Right  Internal Jugular Placement     Home Meds: Prior to Admission medications   Medication Sig Start Date End Date Taking? Authorizing Provider  amLODipine (NORVASC) 10 MG tablet Take 10 mg by mouth daily.   Yes Historical Provider, MD  B Complex-C-Folic Acid (DIALYVITE TABLET) TABS Take 1 tablet by mouth daily. 09/28/12  Yes Sorin June Leap, MD  calcium acetate (PHOSLO) 667 MG capsule Take 667 mg by mouth 3 (three) times daily with meals.   Yes Historical Provider, MD  feeding supplement, RESOURCE BREEZE, (RESOURCE BREEZE) LIQD Take 1 Container by mouth 3 (three) times daily between meals. 05/20/14  Yes Barton Dubois, MD  hydrALAZINE (APRESOLINE) 50 MG tablet Take 50 mg by mouth 2 (two) times daily.    Yes Historical Provider, MD  HYDROcodone-acetaminophen (NORCO/VICODIN) 5-325 MG per tablet Take 1 tablet by mouth every 6 (six) hours as needed for moderate pain.   Yes Historical Provider, MD  NIFEdipine (PROCARDIA XL/ADALAT-CC) 60 MG 24 hr tablet Take 60 mg by mouth daily.   Yes Historical Provider, MD  Nutritional Supplements (NEPRO) LIQD Take 237 mLs by mouth 2 (two) times daily. 05/20/14  Yes Barton Dubois, MD  OVER THE COUNTER MEDICATION Apply 1 application topically daily as needed (for shoulder pain).   Yes Historical Provider, MD  oxyCODONE (OXY IR/ROXICODONE) 5 MG immediate release tablet Take 1 tablet (5 mg total) by mouth every 6 (six) hours as needed for severe pain. 05/20/14  Yes Barton Dubois, MD  tamsulosin (FLOMAX) 0.4 MG CAPS capsule Take 0.4 mg by mouth daily after breakfast.    Yes Historical Provider, MD  terazosin (HYTRIN) 2 MG capsule Take 2 mg by mouth at bedtime.   Yes Historical Provider, MD    Inpatient Medications:  . amLODipine  10 mg Oral Daily  . aspirin  325 mg Oral Daily  . calcium acetate  667 mg Oral TID WC  . [START ON 07/07/2014] doxercalciferol  2 mcg Intravenous Q M,W,F-HD  . feeding supplement (RESOURCE BREEZE)  1 Container Oral TID BM  . hydrALAZINE  50 mg Oral  BID  . NIFEdipine  60 mg Oral Daily  . sodium chloride  3 mL Intravenous Q12H  . sodium chloride  3 mL Intravenous Q12H  . tamsulosin  0.4 mg Oral QPC breakfast  . terazosin  2 mg Oral QHS   . heparin 750 Units/hr (07/06/14 0100)    Allergies: No Known Allergies  History   Social History  . Marital Status: Single    Spouse Name: N/A    Number of Children: 2  . Years of Education: N/A   Occupational History  . Not on file.   Social History Main Topics  . Smoking status: Current Every Day Smoker -- 0.50 packs/day for 50 years    Types: Cigarettes  . Smokeless tobacco: Never Used  . Alcohol Use: No     Comment: Occasional  . Drug Use: No  . Sexual Activity: Yes   Other Topics Concern  . Not on file   Social History Narrative  .  No narrative on file     Family History  Problem Relation Age of Onset  . Heart attack Mother     MI in her 67s  . Anesthesia problems Neg Hx   . Hypotension Neg Hx   . Malignant hyperthermia Neg Hx   . Pseudochol deficiency Neg Hx      Review of Systems General: negative for chills, fever, night sweats or weight changes.  Cardiovascular:per HPI  Dermatological: negative for rash Respiratory: negative for cough or wheezing Urologic: negative for hematuria Abdominal: negative for nausea, vomiting, diarrhea, bright red blood per rectum, melena, or hematemesis Neurologic: negative for visual changes, syncope, or dizziness All other systems reviewed and are otherwise negative except as noted above.  Labs:  Recent Labs  07/05/14 2149  TROPONINI 0.99*   Lab Results  Component Value Date   WBC 12.6* 07/05/2014   HGB 19.0* 07/05/2014   HCT 56.0* 07/05/2014   MCV 90.0 07/05/2014   PLT 175 07/05/2014    Recent Labs Lab 07/05/14 1910 07/05/14 1957  NA 141 143  K 4.5 4.3  CL 105 110  CO2 16*  --   BUN 29* 31*  CREATININE 7.13* 8.00*  CALCIUM 9.4  --   GLUCOSE 161* 167*   Lab Results  Component Value Date   CHOL 138 05/07/2012    HDL 37* 05/07/2012   LDLCALC 78 05/07/2012   TRIG 114 05/07/2012   No results found for this basename: DDIMER    Radiology/Studies:  Dg Chest Port 1 View  07/05/2014   CLINICAL DATA:  Chest pain and short of breath. Skipped dialysis today  EXAM: PORTABLE CHEST - 1 VIEW  COMPARISON:  05/19/2014  FINDINGS: Symmetric bilateral airspace disease has developed since the prior study, with basilar predominance. No significant pleural effusion. Heart size and vascularity appear normal.  IMPRESSION: Symmetric bibasilar airspace disease. Probable pulmonary edema. Pneumonia not excluded.   Electronically Signed   By: Franchot Gallo M.D.   On: 07/05/2014 19:56    EKG: NSR , LVH with secondar repol changes   Physical Exam: Blood pressure 131/76, pulse 100, temperature 97.8 F (36.6 C), temperature source Oral, resp. rate 19, height 5\' 10"  (1.778 m), weight 61.4 kg (135 lb 5.8 oz), SpO2 96.00%. General: malnourished thin body habitus, lyin in bed, appears lethargic , getting dialysis Neck: Negative for carotid bruits. JVD not elevated. Lungs: Clear bilaterally to auscultation without wheezes, rales, or rhonchi. Breathing is mildly labored  Heart: RRR with S1 S2. 1/6 systolic murmur  Abdomen: Soft, non-tender, non-distended with normoactive bowel sounds. No hepatomegaly. No rebound/guarding. No obvious abdominal masses. Extremities: No clubbing or cyanosis. No edema.  Distal pedal pulses are 2+ and equal bilaterally. Neuro: Alert and oriented X 3. No facial asymmetry. No focal deficit. Moves all extremities spontaneously. Psych:  Responds to questions appropriately with a normal affect.     Cory Roughen, A M.D  07/06/2014, 1:40 AM

## 2014-07-06 NOTE — Progress Notes (Signed)
INITIAL NUTRITION ASSESSMENT  DOCUMENTATION CODES Per approved criteria  -Severe malnutrition in the context of chronic illness   INTERVENTION:  Continue Resource Breeze PO TID when not NPO, each supplement provides 250 kcal and 9 grams of protein  Diet advancement as tolerated per MD, recommend regular diet with Resource or Nepro PO TID.  NUTRITION DIAGNOSIS: Malnutrition related to inadequate oral intake as evidenced by severe depletion of fat and muscle mass with 12% weight loss in the past 2 months.   Goal: Intake to meet >90% of estimated nutrition needs.  Monitor:  Diet advancement, PO intake, labs, weight trend.  Reason for Assessment: MST  68 y.o. male  Admitting Dx: Hypertensive emergency  ASSESSMENT: 68 y.o. male h/o ESRD dialysis MWF presents to the ED today in respiratory distress due to missed dialysis session.  Patient endorses a lot of weight loss, c/o poor appetite and poor oral intake. He says he is waiting on surgery later today.  Nutrition Focused Physical Exam:  Subcutaneous Fat:  Orbital Region: mild depletion Upper Arm Region: severe depletion Thoracic and Lumbar Region: severe depletion  Muscle:  Temple Region: moderate depletion Clavicle Bone Region: severe depletion Clavicle and Acromion Bone Region: severe depletion Scapular Bone Region: severe depletion Dorsal Hand: severe depletion Patellar Region: severe depletion Anterior Thigh Region: severe depletion Posterior Calf Region: severe depletion  Edema: none  Pt meets criteria for severe MALNUTRITION in the context of chronic illness as evidenced by severe depletion of fat and muscle mass with 12% weight loss in the past 2 months.   Height: Ht Readings from Last 1 Encounters:  07/05/14 5\' 10"  (1.778 m)    Weight: Wt Readings from Last 1 Encounters:  07/06/14 131 lb 13.4 oz (59.8 kg)    Ideal Body Weight: 75.5 kg  % Ideal Body Weight: 78%  Wt Readings from Last 10  Encounters:  07/06/14 131 lb 13.4 oz (59.8 kg)  07/06/14 131 lb 13.4 oz (59.8 kg)  07/03/14 138 lb 10.7 oz (62.9 kg)  05/19/14 148 lb (67.132 kg)  01/20/13 151 lb 11 oz (68.805 kg)  10/01/12 140 lb 10.5 oz (63.8 kg)  10/01/12 140 lb 10.5 oz (63.8 kg)  10/01/12 140 lb 10.5 oz (63.8 kg)  09/08/12 147 lb 1.9 oz (66.733 kg)  05/08/12 148 lb 12.8 oz (67.495 kg)    Usual Body Weight: 148 lb within the past 2 months  % Usual Body Weight: 88%  BMI:  Body mass index is 18.92 kg/(m^2).  Estimated Nutritional Needs: Kcal: 1800-2000 Protein: 80-90 gm Fluid: 1.2 L  Skin: WDL  Diet Order: NPO  EDUCATION NEEDS: -Education not appropriate at this time   Intake/Output Summary (Last 24 hours) at 07/06/14 1422 Last data filed at 07/06/14 1200  Gross per 24 hour  Intake  216.1 ml  Output   2245 ml  Net -2028.9 ml    Last BM: 8/6   Labs:   Recent Labs Lab 07/03/14 1829 07/05/14 1910 07/05/14 1957 07/06/14 0422  NA 141 141 143 141  K 3.1* 4.5 4.3 3.4*  CL 104 105 110 98  CO2 26 16*  --  26  BUN 7 29* 31* 10  CREATININE 3.12* 7.13* 8.00* 2.74*  CALCIUM 8.2* 9.4  --  9.4  GLUCOSE 82 161* 167* 79    CBG (last 3)   Recent Labs  07/06/14 0544  GLUCAP 88    Scheduled Meds: . amLODipine  10 mg Oral Daily  . antiseptic oral rinse  7  mL Mouth Rinse BID  . [START ON 07/07/2014] aspirin  81 mg Oral Pre-Cath  . aspirin  325 mg Oral Daily  . calcium acetate  667 mg Oral TID WC  . [START ON 07/07/2014] doxercalciferol  2 mcg Intravenous Q M,W,F-HD  . feeding supplement (RESOURCE BREEZE)  1 Container Oral TID BM  . hydrALAZINE  50 mg Oral BID  . NIFEdipine  60 mg Oral Daily  . sodium chloride  3 mL Intravenous Q12H  . sodium chloride  3 mL Intravenous Q12H  . sodium chloride  3 mL Intravenous Q12H  . tamsulosin  0.4 mg Oral QPC breakfast  . terazosin  2 mg Oral QHS    Continuous Infusions: . heparin 750 Units/hr (07/06/14 1200)    Past Medical History  Diagnosis Date   . HTN (hypertension)   . Anxiety   . Cardiomyopathy   . Renal insufficiency   . Myocardial infarction   . CVA (cerebral infarction)   . Chest pain   . Shortness of breath   . Stroke   . Headache(784.0)   . Arthritis     Past Surgical History  Procedure Laterality Date  . Tee without cardioversion  10/09/2011    Procedure: TRANSESOPHAGEAL ECHOCARDIOGRAM (TEE);  Surgeon: Lelon Perla, MD;  Location: [facility] ENDOSCOPY;  Service: Cardiovascular;  Laterality: N/A;  . Right hand    . Insertion of dialysis catheter  09/21/2012    Procedure: INSERTION OF DIALYSIS CATHETER;  Surgeon: Angelia Mould, MD;  Location: [place];  Service: Vascular;  Laterality: N/A;  Right Internal Jugular Placement    Molli Barrows, [place], [place], [place] Pager [phone] After Hours Pager [phone]

## 2014-07-06 NOTE — Progress Notes (Signed)
ANTICOAGULATION CONSULT NOTE - Initial Consult  Pharmacy Consult for heparin Indication: chest pain/ACS  No Known Allergies  Patient Measurements: Height: 5\' 10"  (177.8 cm) Weight: 131 lb 13.4 oz (59.8 kg) IBW/kg (Calculated) : 73  Vital Signs: Temp: 98.1 F (36.7 C) (08/06 2043) Temp src: Oral (08/06 2043) BP: 127/59 mmHg (08/06 2139) Pulse Rate: 65 (08/06 2043)  Labs:  Recent Labs  07/05/14 1910 07/05/14 1957 07/05/14 2149 07/06/14 0422 07/06/14 0944 07/06/14 1200 07/06/14 2110  HGB 14.7 19.0*  --  13.1  --   --   --   HCT 43.4 56.0*  --  37.8*  --   --   --   PLT 175  --   --  142*  --   --   --   LABPROT  --   --   --   --   --  14.3  --   INR  --   --   --   --   --  1.11  --   HEPARINUNFRC  --   --   --   --   --  <0.10* 0.14*  CREATININE 7.13* 8.00*  --  2.74*  --   --   --   TROPONINI  --   --  0.99* 0.98* 0.85*  --   --     Estimated Creatinine Clearance: 21.8 ml/min (by C-G formula based on Cr of 2.74).   Medical History: Past Medical History  Diagnosis Date  . HTN (hypertension)   . Anxiety   . Cardiomyopathy   . Renal insufficiency   . Myocardial infarction   . CVA (cerebral infarction)   . Chest pain   . Shortness of breath   . Stroke   . Headache(784.0)   . Arthritis     Medications:  Prescriptions prior to admission  Medication Sig Dispense Refill  . amLODipine (NORVASC) 10 MG tablet Take 10 mg by mouth daily.      . B Complex-C-Folic Acid (DIALYVITE TABLET) TABS Take 1 tablet by mouth daily.    0  . calcium acetate (PHOSLO) 667 MG capsule Take 667 mg by mouth 3 (three) times daily with meals.      . feeding supplement, RESOURCE BREEZE, (RESOURCE BREEZE) LIQD Take 1 Container by mouth 3 (three) times daily between meals.    0  . hydrALAZINE (APRESOLINE) 50 MG tablet Take 50 mg by mouth 2 (two) times daily.       Marland Kitchen HYDROcodone-acetaminophen (NORCO/VICODIN) 5-325 MG per tablet Take 1 tablet by mouth every 6 (six) hours as needed for  moderate pain.      Marland Kitchen NIFEdipine (PROCARDIA XL/ADALAT-CC) 60 MG 24 hr tablet Take 60 mg by mouth daily.      . Nutritional Supplements (NEPRO) LIQD Take 237 mLs by mouth 2 (two) times daily.    0  . OVER THE COUNTER MEDICATION Apply 1 application topically daily as needed (for shoulder pain).      Marland Kitchen oxyCODONE (OXY IR/ROXICODONE) 5 MG immediate release tablet Take 1 tablet (5 mg total) by mouth every 6 (six) hours as needed for severe pain.  30 tablet  0  . tamsulosin (FLOMAX) 0.4 MG CAPS capsule Take 0.4 mg by mouth daily after breakfast.       . terazosin (HYTRIN) 2 MG capsule Take 2 mg by mouth at bedtime.       Scheduled:  . amLODipine  10 mg Oral Daily  . antiseptic oral rinse  7  mL Mouth Rinse BID  . [START ON 07/07/2014] aspirin  81 mg Oral Pre-Cath  . aspirin  325 mg Oral Daily  . calcium acetate  667 mg Oral TID WC  . [START ON 07/07/2014] doxercalciferol  2 mcg Intravenous Q M,W,F-HD  . feeding supplement (RESOURCE BREEZE)  1 Container Oral TID BM  . hydrALAZINE  50 mg Oral BID  . NIFEdipine  60 mg Oral Daily  . sodium chloride  3 mL Intravenous Q12H  . sodium chloride  3 mL Intravenous Q12H  . sodium chloride  3 mL Intravenous Q12H  . tamsulosin  0.4 mg Oral QPC breakfast  . terazosin  2 mg Oral QHS    Assessment: 68 yo male c/o SOB and CP, skipped HD yesterday d/t feeling poor, hypertensive upon EMS arrival, initial troponin negative then began increasing.  Heparin is low again tonight. Will adjust rate. Cath reschedule for AM  Goal of Therapy:  Heparin level 0.3-0.7 units/ml Monitor platelets by anticoagulation protocol: Yes   Plan:   Increase heparin to 1150 units/hr Daily HL, CBC

## 2014-07-06 NOTE — Progress Notes (Addendum)
See H&P from this morning for full detail  1. Acute on Chronic systolic CHF due to missed dialysis and BP meds  2. NSTEMI   - likely type II in the setting of hypertensive emergency, medical noncompliance, acute on chronic systolic CHF due to missed dialysis and ESRD, however cannot r/o type I either   - dull R sided chest pain and sharp L sided chest pain started with SOB last night, resolved after dialysis overnight  - troponin flat 0.99 -->0.98, continue to trend  3. CAD  - myoview 08/12/2011 abnormal stress nuclear study with apical thinning and global hypokinesis  - last cath 09/28/2012 LVEF 35-40%,  25% L main, 60% prox D1, 30-40% mid-LAD, 50% ostial PDA, diffuse 50% RCA stenosis. Medical management for nonobstructive CAD  4. Nonischemic cardiomyopathy, chronic  - intolerant to BB due to bradycardia  - Echo 10/07/2011 EF 40-45%   - Echo 05/07/2012 EF 60-65%  - Echo 09/20/2012 EF 30-35%  - Echo 05/20/2014 EF 40%, moderate LVH, grade 1 diastolic dysfunction  5. Hypertension uncontrolled, >200 on arrival   6. ESRD on HD M/W/F, emergent dialysis last night   Conclusion: patient's EF appears to be fluctuating in the past several years, recent echo shows EF improved. However patient previous troponin a month was negative even with ESRD, he only missed dialysis by 6 month, question is can uncontrolled HTN truly increase troponin this morning. Nephrology was also concerned. Will discuss with Dr. Mare Ferrari to possibly schedule cath this pm or tomorrow am to definitively assess coronary anatomy.   Trevor Corrigan PA Pager: [phone]  Patient examined. History reviewed. He missed his dialysis yesterday because he developed chest tightness and dyspnea early yesterday morning. He had marked sinus tachycardia at rate 155 yesterday at 1907. This could have contributed to elevated troponin. EKG shows marked anterolateral ST changes of LVH with strain/ischemia. Will plan to go ahead with cath today.   He had moderate disease at cath 2013.

## 2014-07-06 NOTE — Progress Notes (Signed)
[facility] ROUNDING NOTE Subjective:  Feeling better Lying flat Breathing much improved Left chest "a little sore" but not the severe pain he had last PM On IV heparin  Objective Vital signs in last 24 hours: Filed Vitals:   07/06/14 0600 07/06/14 0739 07/06/14 0800 07/06/14 0935  BP: 155/90 170/80 169/84 176/85  Pulse: 92 79 80   Temp:  98.4 F (36.9 C)    TempSrc:  Oral    Resp: 18 13 17    Height:      Weight:      SpO2: 100% 99% 99%    Weight change:   Intake/Output Summary (Last 24 hours) at 07/06/14 1014 Last data filed at 07/06/14 0800  Gross per 24 hour  Intake  186.1 ml  Output   2245 ml  Net -2058.9 ml    PHYSICAL EXAMINATION  BP 176/85  Pulse 80  Temp(Src) 98.4 F (36.9 C) (Oral)  Resp 17  Ht 5\' 10"  (1.778 m)  Wt 59.1 kg (130 lb 4.7 oz)  BMI 18.69 kg/m2  SpO2 99% Thin WM  NAD  Lying flat Post dialysis weight 59.1 kg (EDW 62 kg as outtpt) Neck veins not distended Dry base Tachy around 95 S1S2 No S3 heard today Abd soft without focal tenderness No edema whatsoever of the LE's AVF with good bruit and thrill left upper arm  Basic Metabolic Panel:  Recent Labs Lab 07/03/14 1829 07/05/14 1910 07/05/14 1957 07/06/14 0422  NA 141 141 143 141  K 3.1* 4.5 4.3 3.4*  CL 104 105 110 98  CO2 26 16*  --  26  GLUCOSE 82 161* 167* 79  BUN 7 29* 31* 10  CREATININE 3.12* 7.13* 8.00* 2.74*  CALCIUM 8.2* 9.4  --  9.4    Recent Labs Lab 07/03/14 1829 07/05/14 1910 07/05/14 1957 07/06/14 0422  WBC 3.8* 12.6*  --  7.3  NEUTROABS 1.7  --   --   --   HGB 11.2* 14.7 19.0* 13.1  HCT 33.0* 43.4 56.0* 37.8*  MCV 87.3 90.0  --  87.3  PLT 106* 175  --  142*    Recent Labs Lab 07/05/14 2149 07/06/14 0422  TROPONINI 0.99* 0.98*   CBG:  Recent Labs Lab 07/06/14 0544  GLUCAP 88    Iron Studies: No results found for this basename: IRON, TIBC, TRANSFERRIN, FERRITIN,  in the last 168 hours Studies/Results: Dg Chest Port 1  View  07/05/2014   CLINICAL DATA:  Chest pain and short of breath. Skipped dialysis today  EXAM: PORTABLE CHEST - 1 VIEW  COMPARISON:  05/19/2014  FINDINGS: Symmetric bilateral airspace disease has developed since the prior study, with basilar predominance. No significant pleural effusion. Heart size and vascularity appear normal.  IMPRESSION: Symmetric bibasilar airspace disease. Probable pulmonary edema. Pneumonia not excluded.   Electronically Signed   By: Franchot Gallo M.D.   On: 07/05/2014 19:56   Medications: . heparin 750 Units/hr (07/06/14 0800)   . amLODipine  10 mg Oral Daily  . antiseptic oral rinse  7 mL Mouth Rinse BID  . aspirin  325 mg Oral Daily  . calcium acetate  667 mg Oral TID WC  . [START ON 07/07/2014] doxercalciferol  2 mcg Intravenous Q M,W,F-HD  . feeding supplement (RESOURCE BREEZE)  1 Container Oral TID BM  . hydrALAZINE  50 mg Oral BID  . NIFEdipine  60 mg Oral Daily  . sodium chloride  3 mL Intravenous Q12H  . sodium chloride  3 mL Intravenous Q12H  . tamsulosin  0.4 mg Oral QPC breakfast  . terazosin  2 mg Oral QHS    I  have reviewed scheduled and prn medications.  Dialyzes at [place] 2K, 2.25 calc, Dialyzer 180, Heparin yes. Access AVF.  Background 68 yo AAM with NICM, admitted with chest pain and acute pulmonary edema.  Trops +. Had non-obstructive CAD by cath in 2013.  EF 40% last check.  Last admission in June was for recurrent syncope and pt noted to have some PAF.  Just wore monitor (was to turn in today).  His prior syncope was attributed to "too much fluid pulled off at HD" but despite raising his EDW has continued to have syncopal episodes in addition to this current admission that was preceded by chest pain/pressure (which was why he did not attend his HD yesterday).   SOB/pulm edema/HTve urgency- required emergent dialysis last evening.  Now well below previous dry weight.  Chest "sore" but not the sever pain he had yesterday.  I am  not at all convinced that this was exclusively a "volume overload d/t missed HD" issue although several factors including sig BP elev. Marland KitchenMarland KitchenMarland KitchenCards is evaluating for possible heart cath. On IV heparin.    PAF - noted on last admit. Just wore Holter (took off yesterday - it is at his home.Marland KitchenMarland KitchenHe was to have seen Dr. Stanford Breed today)  ESRD: MWF via AVF at [place] - had urgent HD last PM.  No acute needs today.  Volume and resp fine.  Hypertension: Meds. Volume component not issue right now.  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

## 2014-07-06 NOTE — Progress Notes (Signed)
UR completed 

## 2014-07-06 NOTE — Progress Notes (Signed)
Patient sitting on side of bed and states,  " I have to eat, I have not eaten since yesterday morning and I need food.  They can cancel this test until tomorrow". Explained the need to stay NPO for cardiac cath but now raising voice, he needs to eat.  Cath lab called and received order to place patient on diet previously on and reschedule cath for the am. Continue to watch.

## 2014-07-06 NOTE — Progress Notes (Signed)
Patient received from ED on nitro gtt. While admitting, patient complained of localized 5/10 chest pain, increased nitro gtt. BP 182/102. HR 108. On 4L [place] with SpO2 of 91%. Positive troponin. Dr. Alcario Drought notified. Stated that he wanted to keep nitro gtt going and titrate accordingly to BP and CP. New orders received for heparin gtt and ASA. Will continue to monitor.   Lum Babe, RN

## 2014-07-06 NOTE — Progress Notes (Signed)
[place] TEAM 1 - Stepdown/ICU TEAM Progress Note  UTSAV KAEHLER [id number] DOB: [birth year] DOA: 07/05/2014 PCP: [name], MD  Admit HPI / Brief Narrative: 68 y.o. BM PMHx HTN, non ischemic cardiomyopathy,  as well as ESRD dialysis M/W/F presents to the ED today in respiratory distress. He relates that today he "wasnt feeling well" and so skipped his dialysis today. Unfortunatly this caused him to feel progressively worse and he is brought in with low O2 sats, labored breathing, tachypnea, and chest pain / pressure. Blood pressures have been in the 200's- CXR showed pulmonary edema- he has been treated so far with bipap and nitro with reasonable results. He requires emergent dialysis this evening   HPI/Subjective: 8/6 complains of right-sided chest pain, negative radiation, negative N./V., rates pain at 4/10    Assessment/Plan: Hypertensive emergency -Resolved, patient's BP within AHA guidelines -Continue amlodipine 10 mg daily -Continue hydralazine 50 mg BID -Continue nifedipine 60 mg daily  Combined systolic and diastolic CHF -Obtain lipid panel -Obtain A1c -Control BP with HD per nephrology -Patient scheduled for cardiac cath today; n.p.o. until post cath  End stage renal disease on HD M/W/F -HD per nephrology  Noncompliance -Patient counseled on sequela of continuing to be noncompliant with medication and HD to include death   Code Status: FULL Family Communication: no family present at time of exam Disposition Plan: Per cardiology    Consultants: Dr. Jamal Maes (nephrology)\ Dr. Darlin Coco (cardiology)   Procedure/Significant Events: 6/20 echocardiogram-Left ventricle: moderate LVH. LVEF= 40%. Diffuse hypokinesis.  - (grade 1 diastolic dysfunction). - Left atrium:  mildly dilated.     Culture NA  Antibiotics: NA  DVT prophylaxis: Heparin drip   Devices NA   LINES / TUBES:  8./5 18ga right forearm 8/5 18ga right  hand ?? Left AV fistula   Continuous Infusions: . heparin 750 Units/hr (07/06/14 0800)    Objective: VITAL SIGNS: Temp: 98.4 F (36.9 C) (08/06 0739) Temp src: Oral (08/06 0739) BP: 169/84 mmHg (08/06 0800) Pulse Rate: 80 (08/06 0800) SPO2; 96% on 2 L via [place] FIO2:   Intake/Output Summary (Last 24 hours) at 07/06/14 0814 Last data filed at 07/06/14 0800  Gross per 24 hour  Intake  186.1 ml  Output   2245 ml  Net -2058.9 ml     Exam: General: A./O. x4, right sided CP reproducible with palpation,No acute respiratory distress Lungs: Clear to auscultation bilaterally without wheezes or crackles Cardiovascular: Regular rate and rhythm without murmur gallop or rub normal S1 and S2 Abdomen: Nontender, nondistended, soft, bowel sounds positive, no rebound, no ascites, no appreciable mass Extremities: No significant cyanosis, clubbing, or edema bilateral lower extremities  Data Reviewed: Basic Metabolic Panel:  Recent Labs Lab 07/03/14 1829 07/05/14 1910 07/05/14 1957 07/06/14 0422  NA 141 141 143 141  K 3.1* 4.5 4.3 3.4*  CL 104 105 110 98  CO2 26 16*  --  26  GLUCOSE 82 161* 167* 79  BUN 7 29* 31* 10  CREATININE 3.12* 7.13* 8.00* 2.74*  CALCIUM 8.2* 9.4  --  9.4   Liver Function Tests: No results found for this basename: AST, ALT, ALKPHOS, BILITOT, PROT, ALBUMIN,  in the last 168 hours No results found for this basename: LIPASE, AMYLASE,  in the last 168 hours No results found for this basename: AMMONIA,  in the last 168 hours CBC:  Recent Labs Lab 07/03/14 1829 07/05/14 1910 07/05/14 1957 07/06/14 0422  WBC 3.8* 12.6*  --  7.3  NEUTROABS 1.7  --   --   --   HGB 11.2* 14.7 19.0* 13.1  HCT 33.0* 43.4 56.0* 37.8*  MCV 87.3 90.0  --  87.3  PLT 106* 175  --  142*   Cardiac Enzymes:  Recent Labs Lab 07/05/14 2149 07/06/14 0422  TROPONINI 0.99* 0.98*   BNP (last 3 results)  Recent Labs  07/05/14 1910  PROBNP 69174.0*   CBG:  Recent Labs Lab  07/06/14 0544  GLUCAP 88    Recent Results (from the past 240 hour(s))  MRSA PCR SCREENING     Status: None   Collection Time    07/06/14  2:02 AM      Result Value Ref Range Status   MRSA by PCR NEGATIVE  NEGATIVE Final   Comment:            The GeneXpert MRSA Assay (FDA     approved for NASAL specimens     only), is one component of a     comprehensive MRSA colonization     surveillance program. It is not     intended to diagnose MRSA     infection nor to guide or     monitor treatment for     MRSA infections.     Studies:  Recent x-ray studies have been reviewed in detail by the Attending Physician  Scheduled Meds:  Scheduled Meds: . amLODipine  10 mg Oral Daily  . antiseptic oral rinse  7 mL Mouth Rinse BID  . aspirin  325 mg Oral Daily  . calcium acetate  667 mg Oral TID WC  . [START ON 07/07/2014] doxercalciferol  2 mcg Intravenous Q M,W,F-HD  . feeding supplement (RESOURCE BREEZE)  1 Container Oral TID BM  . hydrALAZINE  50 mg Oral BID  . NIFEdipine  60 mg Oral Daily  . sodium chloride  3 mL Intravenous Q12H  . sodium chloride  3 mL Intravenous Q12H  . tamsulosin  0.4 mg Oral QPC breakfast  . terazosin  2 mg Oral QHS    Time spent on care of this patient: 40 mins   Allie Bossier , MD   [facility] Office  [phone] Pager - [phone]  On-Call/Text Page:      [email or web address]      password [email or web address]  If 7PM-7AM, please contact night-coverage [email or web address] Password [email or web address] 07/06/2014, 8:14 AM   LOS: 1 day

## 2014-07-07 ENCOUNTER — Encounter (HOSPITAL_COMMUNITY)
Admission: EM | Disposition: A | Discharge status: Skilled Nursing Facility | Payer: Self-pay | Source: Home / Self Care | Attending: Internal Medicine

## 2014-07-07 DIAGNOSIS — I2510 Atherosclerotic heart disease of native coronary artery without angina pectoris: Secondary | ICD-10-CM

## 2014-07-07 HISTORY — PX: LEFT HEART CATHETERIZATION WITH CORONARY ANGIOGRAM: SHX5451

## 2014-07-07 LAB — COMPREHENSIVE METABOLIC PANEL
ALK PHOS: 65 U/L (ref 39–117)
ALT: 10 U/L (ref 0–53)
AST: 13 U/L (ref 0–37)
Albumin: 3.1 g/dL — ABNORMAL LOW (ref 3.5–5.2)
Anion gap: 15 (ref 5–15)
BILIRUBIN TOTAL: 0.5 mg/dL (ref 0.3–1.2)
BUN: 30 mg/dL — ABNORMAL HIGH (ref 6–23)
CHLORIDE: 97 meq/L (ref 96–112)
CO2: 27 mEq/L (ref 19–32)
Calcium: 8.7 mg/dL (ref 8.4–10.5)
Creatinine, Ser: 6.07 mg/dL — ABNORMAL HIGH (ref 0.50–1.35)
GFR, EST AFRICAN AMERICAN: 10 mL/min — AB (ref >90)
GFR, EST NON AFRICAN AMERICAN: 9 mL/min — AB (ref >90)
GLUCOSE: 94 mg/dL (ref 70–99)
POTASSIUM: 3.5 meq/L — AB (ref 3.7–5.3)
SODIUM: 139 meq/L (ref 137–147)
Total Protein: 6.2 g/dL (ref 6.0–8.3)

## 2014-07-07 LAB — HEPARIN LEVEL (UNFRACTIONATED): HEPARIN UNFRACTIONATED: 0.29 [IU]/mL — AB (ref 0.30–0.70)

## 2014-07-07 LAB — LIPID PANEL
CHOL/HDL RATIO: 2.9 ratio
Cholesterol: 103 mg/dL (ref 0–200)
HDL: 36 mg/dL — AB (ref >39)
LDL CALC: 50 mg/dL (ref 0–99)
Triglycerides: 87 mg/dL (ref <150)
VLDL: 17 mg/dL (ref 0–40)

## 2014-07-07 LAB — CBC
HEMATOCRIT: 32.9 % — AB (ref 39.0–52.0)
HEMOGLOBIN: 11.2 g/dL — AB (ref 13.0–17.0)
MCH: 29.9 pg (ref 26.0–34.0)
MCHC: 34 g/dL (ref 30.0–36.0)
MCV: 88 fL (ref 78.0–100.0)
Platelets: 131 10*3/uL — ABNORMAL LOW (ref 150–400)
RBC: 3.74 MIL/uL — ABNORMAL LOW (ref 4.22–5.81)
RDW: 13.1 % (ref 11.5–15.5)
WBC: 4.3 10*3/uL (ref 4.0–10.5)

## 2014-07-07 LAB — MAGNESIUM: Magnesium: 2 mg/dL (ref 1.5–2.5)

## 2014-07-07 LAB — POCT ACTIVATED CLOTTING TIME
ACTIVATED CLOTTING TIME: 191 s
Activated Clotting Time: 281 seconds
Activated Clotting Time: 214 seconds
Activated Clotting Time: 174 seconds

## 2014-07-07 LAB — GLUCOSE, CAPILLARY: GLUCOSE-CAPILLARY: 94 mg/dL (ref 70–99)

## 2014-07-07 LAB — HEMOGLOBIN A1C
Hgb A1c MFr Bld: 5.2 % (ref <5.7)
MEAN PLASMA GLUCOSE: 103 mg/dL (ref <117)

## 2014-07-07 SURGERY — LEFT HEART CATHETERIZATION WITH CORONARY ANGIOGRAM
Anesthesia: LOCAL

## 2014-07-07 MED ORDER — HEPARIN (PORCINE) IN NACL 2-0.9 UNIT/ML-% IJ SOLN
INTRAMUSCULAR | Status: AC
  Filled 2014-07-07: qty 1000

## 2014-07-07 MED ORDER — SODIUM CHLORIDE 0.9 % IV SOLN: 1.0000 mL/kg/h | INTRAVENOUS | Status: AC

## 2014-07-07 MED ORDER — SODIUM CHLORIDE 0.9 % IV SOLN: 250.0000 mL | INTRAVENOUS | Status: DC | PRN

## 2014-07-07 MED ORDER — SODIUM CHLORIDE 0.9 % IV BOLUS (SEPSIS)
250.0000 mL | Freq: Once | INTRAVENOUS | Status: AC
  Administered 2014-07-07: 250 mL via INTRAVENOUS

## 2014-07-07 MED ORDER — MORPHINE SULFATE 2 MG/ML IJ SOLN: 2.0000 mg | INTRAMUSCULAR | Status: DC | PRN

## 2014-07-07 MED ORDER — MIDAZOLAM HCL 2 MG/2ML IJ SOLN
INTRAMUSCULAR | Status: AC
  Filled 2014-07-07: qty 2

## 2014-07-07 MED ORDER — NITROGLYCERIN 0.2 MG/ML ON CALL CATH LAB
INTRAVENOUS | Status: AC
  Filled 2014-07-07: qty 1

## 2014-07-07 MED ORDER — DOXERCALCIFEROL 4 MCG/2ML IV SOLN
INTRAVENOUS | Status: AC
  Administered 2014-07-07: 2 ug
  Filled 2014-07-07: qty 2

## 2014-07-07 MED ORDER — HYDRALAZINE HCL 50 MG PO TABS
50.0000 mg | ORAL_TABLET | Freq: Two times a day (BID) | ORAL | Status: DC
  Administered 2014-07-07 – 2014-07-11 (×7): 50 mg via ORAL
  Filled 2014-07-07 (×10): qty 1

## 2014-07-07 MED ORDER — OXYCODONE HCL 5 MG PO TABS
5.0000 mg | ORAL_TABLET | Freq: Four times a day (QID) | ORAL | Status: DC | PRN
  Administered 2014-07-07 – 2014-07-10 (×2): 5 mg via ORAL
  Filled 2014-07-07 (×2): qty 1

## 2014-07-07 MED ORDER — AMLODIPINE BESYLATE 10 MG PO TABS
10.0000 mg | ORAL_TABLET | Freq: Every day | ORAL | Status: DC
  Administered 2014-07-08 – 2014-07-11 (×4): 10 mg via ORAL
  Filled 2014-07-07 (×4): qty 1

## 2014-07-07 MED ORDER — FENTANYL CITRATE 0.05 MG/ML IJ SOLN
INTRAMUSCULAR | Status: AC
  Filled 2014-07-07: qty 2

## 2014-07-07 MED ORDER — ADENOSINE 12 MG/4ML IV SOLN
12.0000 mL | Freq: Once | INTRAVENOUS | Status: DC
  Filled 2014-07-07: qty 12

## 2014-07-07 MED ORDER — SODIUM CHLORIDE 0.9 % IJ SOLN: 3.0000 mL | Freq: Two times a day (BID) | INTRAMUSCULAR | Status: DC

## 2014-07-07 MED ORDER — SODIUM CHLORIDE 0.9 % IJ SOLN: 3.0000 mL | INTRAMUSCULAR | Status: DC | PRN

## 2014-07-07 MED ORDER — LIDOCAINE HCL (PF) 1 % IJ SOLN
INTRAMUSCULAR | Status: AC
  Filled 2014-07-07: qty 30

## 2014-07-07 MED ORDER — RENA-VITE PO TABS
1.0000 | ORAL_TABLET | Freq: Every day | ORAL | Status: DC
  Administered 2014-07-07 – 2014-07-10 (×4): 1 via ORAL
  Filled 2014-07-07 (×6): qty 1

## 2014-07-07 MED ORDER — HEPARIN SODIUM (PORCINE) 5000 UNIT/ML IJ SOLN
5000.0000 [IU] | Freq: Three times a day (TID) | INTRAMUSCULAR | Status: DC
  Administered 2014-07-07 – 2014-07-11 (×10): 5000 [IU] via SUBCUTANEOUS
  Filled 2014-07-07 (×14): qty 1

## 2014-07-07 NOTE — Clinical Documentation Improvement (Signed)
"  Severe Malnutrition" per Registered Dietician Consult 07/06/14 at 11:26 am. DOCUMENTATION CODES  Per approved criteria   -Severe malnutrition in the context of chronic illness   INTERVENTION:  Continue Resource Breeze PO TID when not NPO, each supplement provides 250 kcal and 9 grams of protein  Diet advancement as tolerated per MD, recommend regular diet with Resource or Nepro PO TID. NUTRITION DIAGNOSIS:  Malnutrition related to inadequate oral intake as evidenced by severe depletion of fat and muscle mass with 12% weight loss in the past 2 months.  Goal:  Intake to meet >90% of estimated nutrition needs.  Monitor:  Diet advancement, PO intake, labs, weight trend.    Possible Clinical Conditions:   - Severe Malnutrition   - Other Severity and/or Type of Malnutrition   - Unable to Clinically Determine   Thank You,  Erling Conte ,RN BSN CCDS Certified Clinical Documentation Specialist:  [phone] [place] Information Management

## 2014-07-07 NOTE — Progress Notes (Signed)
Patient Name: Trevor [name] Date of Encounter: 07/07/2014     Principal Problem:   Hypertensive emergency Active Problems:   Nonischemic cardiomyopathy   History of stroke   Chronic combined systolic and diastolic CHF (congestive heart failure)   Non compliance with medical treatment   Coronary artery disease, non-occlusive: Cath 2013 - Moderate D1 & RCA disease   ESRD on hemodialysis   Acute on chronic combined systolic and diastolic CHF (congestive heart failure)   NSTEMI (non-ST elevated myocardial infarction) - Presumed Type II in setting of HTN Emergency    SUBJECTIVE  The patient had cath earlier today, now back in his room eating lunch. No chest pain. Cath revealed no significant flow-reducing lesions.  CURRENT MEDS . adenosine  12 mL Intravenous Once  . [START ON 07/08/2014] amLODipine  10 mg Oral Daily  . antiseptic oral rinse  7 mL Mouth Rinse BID  . aspirin  325 mg Oral Daily  . calcium acetate  667 mg Oral TID WC  . doxercalciferol  2 mcg Intravenous Q M,W,F-HD  . feeding supplement (RESOURCE BREEZE)  1 Container Oral TID BM  . hydrALAZINE  50 mg Oral BID  . sodium chloride  3 mL Intravenous Q12H  . sodium chloride  3 mL Intravenous Q12H  . sodium chloride  3 mL Intravenous Q12H  . tamsulosin  0.4 mg Oral QPC breakfast  . terazosin  2 mg Oral QHS    OBJECTIVE  Filed Vitals:   07/07/14 1115 07/07/14 1120 07/07/14 1130 07/07/14 1236  BP: 108/52 118/50 108/59 118/55  Pulse: 60 66 56 75  Temp:      TempSrc:    Oral  Resp: 18 13 15 16   Height:      Weight:      SpO2: 97% 97% 97% 100%    Intake/Output Summary (Last 24 hours) at 07/07/14 1310 Last data filed at 07/07/14 0000  Gross per 24 hour  Intake  107.7 ml  Output      0 ml  Net  107.7 ml   Filed Weights   07/06/14 0346 07/06/14 1054 07/07/14 0500  Weight: 130 lb 4.7 oz (59.1 kg) 131 lb 13.4 oz (59.8 kg) 130 lb 4.7 oz (59.1 kg)    PHYSICAL EXAM  General: Pleasant, NAD. Neuro: Alert and  oriented X 3. Moves all extremities spontaneously. Psych: Normal affect. HEENT:  Normal  Neck: Supple without bruits or JVD. Lungs:  Resp regular and unlabored, CTA. Heart: RRR no s3, s4,.  Grade 1/6 systolic murmur at LSE Abdomen: Soft, non-tender, non-distended, BS + x 4.  Extremities: No clubbing, cyanosis or edema. DP/PT/Radials 2+ and equal bilaterally.  Accessory Clinical Findings  CBC  Recent Labs  07/06/14 0422 07/07/14 0230  WBC 7.3 4.3  HGB 13.1 11.2*  HCT 37.8* 32.9*  MCV 87.3 88.0  PLT 142* [id number]*   Basic Metabolic Panel  Recent Labs  07/06/14 0422 07/07/14 0230  NA 141 139  K 3.4* 3.5*  CL 98 97  CO2 26 27  GLUCOSE 79 94  BUN 10 30*  CREATININE 2.74* 6.07*  CALCIUM 9.4 8.7  MG  --  2.0   Liver Function Tests  Recent Labs  07/07/14 0230  AST 13  ALT 10  ALKPHOS 65  BILITOT 0.5  PROT 6.2  ALBUMIN 3.1*   No results found for this basename: LIPASE, AMYLASE,  in the last 72 hours Cardiac Enzymes  Recent Labs  07/05/14 2149 07/06/14 0422 07/06/14 0944  TROPONINI 0.99* 0.98* 0.85*   BNP No components found with this basename: POCBNP,  D-Dimer No results found for this basename: DDIMER,  in the last 72 hours Hemoglobin A1C No results found for this basename: HGBA1C,  in the last 72 hours Fasting Lipid Panel  Recent Labs  07/07/14 0230  CHOL 103  HDL 36*  LDLCALC 50  TRIG 87  CHOLHDL 2.9   Thyroid Function Tests No results found for this basename: TSH, T4TOTAL, FREET3, T3FREE, THYROIDAB,  in the last 72 hours  TELE  NSR  ECG    Radiology/Studies  Dg Chest Port 1 View  07/05/2014   CLINICAL DATA:  Chest pain and short of breath. Skipped dialysis today  EXAM: PORTABLE CHEST - 1 VIEW  COMPARISON:  05/19/2014  FINDINGS: Symmetric bilateral airspace disease has developed since the prior study, with basilar predominance. No significant pleural effusion. Heart size and vascularity appear normal.  IMPRESSION: Symmetric bibasilar  airspace disease. Probable pulmonary edema. Pneumonia not excluded.   Electronically Signed   By: Franchot Gallo M.D.   On: 07/05/2014 19:56    ASSESSMENT AND PLAN  Moderate to severe single-vessel disease involving the early mid circumflex with a roughly 60% stenosis, not physiologically significant by FFR. Otherwise nonobstructive CAD throughout  Surprisingly improved LVEF by the ventriculography compared to previous evaluations  Normal LVEDP with borderline systemic hypotension ESRD dialysed MWF  No further cardiac tests planned.   Continue aggressive risk factor modification for coronary artery disease including lipid control, glycemic control and blood pressure control. Would be stable for discharge from a cardiac standpoint following hemostasis and bed rest.  Signed, Darlin Coco MD

## 2014-07-07 NOTE — Progress Notes (Signed)
ANTICOAGULATION CONSULT NOTE - Follow Up Consult  Pharmacy Consult for Heparin  Indication: chest pain/ACS  No Known Allergies  Patient Measurements: Height: 5\' 10"  (177.8 cm) Weight: 131 lb 13.4 oz (59.8 kg) IBW/kg (Calculated) : 73  Vital Signs: Temp: 98.5 F (36.9 C) (08/07 0030) Temp src: Oral (08/07 0030) BP: 120/54 mmHg (08/07 0300) Pulse Rate: 73 (08/07 0300)  Labs:  Recent Labs  07/05/14 1910 07/05/14 1957 07/05/14 2149 07/06/14 0422 07/06/14 0944 07/06/14 1200 07/06/14 2110 07/07/14 0230  HGB 14.7 19.0*  --  13.1  --   --   --   --   HCT 43.4 56.0*  --  37.8*  --   --   --   --   PLT 175  --   --  142*  --   --   --   --   LABPROT  --   --   --   --   --  14.3  --   --   INR  --   --   --   --   --  1.11  --   --   HEPARINUNFRC  --   --   --   --   --  <0.10* 0.14* 0.29*  CREATININE 7.13* 8.00*  --  2.74*  --   --   --   --   TROPONINI  --   --  0.99* 0.98* 0.85*  --   --   --     Estimated Creatinine Clearance: 21.8 ml/min (by C-G formula based on Cr of 2.74).   Medications:  Heparin 1150 units/hr  Assessment: Heparin for elevated troponin. HL is 0.29. Other labs as above.   Goal of Therapy:  Heparin level 0.3-0.7 units/ml Monitor platelets by anticoagulation protocol: Yes   Plan:  -Increase heparin to 1250 units/hr -1200 HL, pending cath -Daily CBC/HL -Monitor for bleeding  [name] 07/07/2014,3:55 AM

## 2014-07-07 NOTE — Clinical Documentation Improvement (Signed)
"  Uncontrolled Hypertension" documented in current medical record.  Systolic BP in the [id number] over 130's documented requiring IV Ntg drip.  Known ESRD requiring chronic HD.  Required Emergent HD on admission.  Left Ventricular Hypertrophy per current EKG and by Echo 05/2014, also including Grade 1 Diastolic Dysfunction.  EF by Cath 07/07/14 55-60%, relatively normal wall motion.   Possible Clinical Conditions:   - Accelerated Hypertensive Heart Disease WITH OR WITHOUT Heart Failure    - Other Condition   - Unable to Clinically Determine   Thank You,  Erling Conte ,RN BSN CCDS Certified Clinical Documentation Specialist:  [phone]  [place] Information Management

## 2014-07-07 NOTE — Procedures (Signed)
I have personally attended this patient's dialysis session.   4K bath with K of 3.5 BFR 400 via L AVF [facility] green Pre weight 59.1  LVEDP at time of cath was normal BP's a little soft now Drop goal to 1 liter Will have new EDW around 59.5 with clothing  Jamal Maes, MD [place] Pager 07/07/2014, 3:30 PM

## 2014-07-07 NOTE — Clinical Documentation Improvement (Signed)
  Described as being tachypneic, in respiratory distress, anxious, initial 02 sat per EMS 72% on room air, respiratory rate in the mid to upper 20's, required Bipap to maintain 02 sat greater than 90%.  ABG: ph 7.40;  pC02 31.7;  p02 58;  HC03 19.8  Possible Clinical Conditions:   - Acute Respiratory Failure, resolved   - Other Condition   - Unable to Clinically Determine  Thank You, Erling Conte ,RN BSN CCDS Certified Clinical Documentation Specialist:  [phone]  [place] Information Management

## 2014-07-07 NOTE — Progress Notes (Signed)
Patient to cath lab, no complaints, central tele notified.

## 2014-07-07 NOTE — Progress Notes (Signed)
[facility] TEAM 1 - Stepdown/ICU TEAM Progress Note  CAVON SMITHART [id number] DOB: [birth year] DOA: 07/05/2014 PCP: [name], MD  Admit HPI / Brief Narrative: 68 y.o. M with Hx HTN, non ischemic cardiomyopathy, as well as ESRD w/ dialysis M/W/F who presented to the ED in respiratory distress. He related that he "wasn't feeling well" and skipped his dialysis. Unfortunatly this caused him to feel progressively worse and he eventually presented with low O2 sats, labored breathing, tachypnea, and chest pain / pressure. Blood pressures were in the 200's- CXR showed pulmonary edema.  He was been treated with bipap and nitro with reasonable results. He required emergent dialysis.  HPI/Subjective: Resting comfortably in HD.  No new complaints. Denies cp, n/v, sob, or ha.    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  volume management per HD - cont strict BP control  NSTEMI w/ known CAD  Cardiology following - now s/p cath revealing moderate to severe single-vessel disease involving the early mid circumflex with a roughly 60% stenosis o/w nonobstructive CAD throughout  ?Parox Afib  NSR at present - complete outpt w/u as previously planned  End stage renal disease on HD M/W/F HD per nephrology  Noncompliance Patient counseled on sequela of continuing to be noncompliant with medication and HD to include death  Severe malnutrition in the context of chronic illness   Code Status: FULL Family Communication: no family present at time of exam Disposition Plan: probable d/c home in AM if BP remains stable   Consultants: Dr. Jamal Maes (Nephrology) Dr. Darlin Coco (Cardiology)  Procedures: 6/20 echocardiogram - Left ventricle: moderate LVH. LVEF= 40% Diffuse hypokinesis - (grade 1 diastolic dysfunction) - Left atrium:  mildly  dilated  8/7 - cardiac cath  Antibiotics: NA  DVT prophylaxis: SQ heparin   Objective: VITAL SIGNS: Blood pressure 118/62, pulse 86, temperature 97.3 F (36.3 C), temperature source Oral, resp. rate 18, height 5\' 10"  (1.778 m), weight 59.1 kg (130 lb 4.7 oz), SpO2 97.00%.  Intake/Output Summary (Last 24 hours) at 07/07/14 1722 Last data filed at 07/07/14 0000  Gross per 24 hour  Intake   69.7 ml  Output      0 ml  Net   69.7 ml   Exam: General: No acute respiratory distress Lungs: Clear to auscultation bilaterally without wheezes or crackles Cardiovascular: Regular rate and rhythm without murmur gallop or rub normal S1 and S2 Abdomen: Nontender, nondistended, soft, bowel sounds positive, no rebound, no ascites, no appreciable mass Extremities: No significant cyanosis, clubbing, or edema bilateral lower extremities  Data Reviewed: Basic Metabolic Panel:  Recent Labs Lab 07/03/14 1829 07/05/14 1910 07/05/14 1957 07/06/14 0422 07/07/14 0230  NA 141 141 143 141 139  K 3.1* 4.5 4.3 3.4* 3.5*  CL 104 105 110 98 97  CO2 26 16*  --  26 27  GLUCOSE 82 161* 167* 79 94  BUN 7 29* 31* 10 30*  CREATININE 3.12* 7.13* 8.00* 2.74* 6.07*  CALCIUM 8.2* 9.4  --  9.4 8.7  MG  --   --   --   --  2.0   Liver Function Tests:  Recent Labs Lab 07/07/14 0230  AST 13  ALT 10  ALKPHOS 65  BILITOT 0.5  PROT 6.2  ALBUMIN 3.1*   CBC:  Recent Labs Lab 07/03/14 1829 07/05/14 1910 07/05/14 1957 07/06/14 0422 07/07/14 0230  WBC 3.8* 12.6*  --  7.3 4.3  NEUTROABS 1.7  --   --   --   --   HGB 11.2* 14.7 19.0* 13.1 11.2*  HCT 33.0* 43.4 56.0* 37.8* 32.9*  MCV 87.3 90.0  --  87.3 88.0  PLT 106* 175  --  142* 131*   Cardiac Enzymes:  Recent Labs Lab 07/05/14 2149 07/06/14 0422 07/06/14 0944  TROPONINI 0.99* 0.98* 0.85*   CBG:  Recent Labs Lab 07/06/14 0544 07/07/14 0034  GLUCAP 88 94    Recent Results (from the past 240 hour(s))  MRSA PCR SCREENING     Status:  None   Collection Time    07/06/14  2:02 AM      Result Value Ref Range Status   MRSA by PCR NEGATIVE  NEGATIVE Final   Comment:            The GeneXpert MRSA Assay (FDA     approved for NASAL specimens     only), is one component of a     comprehensive MRSA colonization     surveillance program. It is not     intended to diagnose MRSA     infection nor to guide or     monitor treatment for     MRSA infections.     Studies:  Recent x-ray studies have been reviewed in detail by the Attending Physician  Scheduled Meds:  Scheduled Meds: . adenosine  12 mL Intravenous Once  . [START ON 07/08/2014] amLODipine  10 mg Oral Daily  . antiseptic oral rinse  7 mL Mouth Rinse BID  . aspirin  325 mg Oral Daily  . calcium acetate  667 mg Oral TID WC  . doxercalciferol  2 mcg Intravenous Q M,W,F-HD  . feeding supplement (RESOURCE BREEZE)  1 Container Oral TID BM  . hydrALAZINE  50 mg Oral BID  . multivitamin  1 tablet Oral QHS  . sodium chloride  3 mL Intravenous Q12H  . sodium chloride  3 mL Intravenous Q12H  . sodium chloride  3 mL Intravenous Q12H  . tamsulosin  0.4 mg Oral QPC breakfast  . terazosin  2 mg Oral QHS    Time spent on care of this patient: 35 mins  [name], MD [facility] For Consults/Admissions - Flow Manager - [phone] Office  [phone] Pager [phone]  On-Call/Text Page:      [email or web address]      password [facility]  07/07/2014, 5:22 PM   LOS: 2 days

## 2014-07-07 NOTE — Progress Notes (Signed)
Order for sheath removal verified per post procedural orders. Procedure explained to patient and Rt femoral artery access site assessed: level 0, palpable dorsalis pedis and posterior tibial pulses.6 French Sheath removed and manual pressure applied for 20 minutes. Pre, peri, & post procedural vitals: HR76, RR17, O2 Sat 100% RA, BP 105/63, Pain 0. Distal pulses remained intact after sheath removal. Access site level 0 and dressed with 4X4 gauze and tegaderm.  Elberta Fortis, RN confirmed condition of site. Post procedural instructions discussed with return demonstration from patient.   Report given to [employer] on [address]

## 2014-07-07 NOTE — Progress Notes (Signed)
[facility] Progress Note  Subjective:  S/p Cath. No complaints. Eating lunch, voracious appetite after being npo.  Worried that he's losing too much weight.   Objective Filed Vitals:   07/07/14 1115 07/07/14 1120 07/07/14 1130 07/07/14 1236  BP: 108/52 118/50 108/59 118/55  Pulse: 60 66 56 75  Temp:      TempSrc:    Oral  Resp: 18 13 15 16   Height:      Weight:      SpO2: 97% 97% 97% 100%   Physical Exam General: Thin, alert, cooperative, NAD on dialysis at this time Heart: RRR Lungs: CTA bilat, no wheeze/rhonchi. Winded speech at times Abdomen: soft, NT, + BS Extremities: No LE edema Dialysis Access: Lt AVF + bruit  HD Orders: MWF at [facility] EDW 62kg, 2K/2.25Ca bath. Lt AVF 400/800, Profile #4, Heparin 6200u No ESA. Venofer load completed 7/29. No Vit D  Assessment/Plan:  1. NSTEMI (in setting of hypertensive urgency) - Cardiac cath 8/7 - mild diffuse disease but no flow-limiting lesions. No further workup planned.  BP wnl.  2. ESRD: MWF - had urgent HD on 8/5. Next HD today with added K+ bath. 3. HTN/Volume: SBPs 100s-110s. Under EDW. Pulmonary edema and SOB on admit, resolving. Challenge and lower edw. 4. Anemia - Hgb 11.3. No ESA. Fe load completed 0000000. 5. Metabolic Bone Disease: Ca/Phos controlled. Last PTH in range. Continue Phoslo 1 ac. Not on Vit D.  6. PAF - noted on last admit. He just completed holter study/ ? missed f/u due to hosp admit. 7. Malnutrition - Alb 3.1. Losing wgt/poor po intake. Reliant upon "meals-on-wheels" and eats sporadically. Multivit, supplements.   Collene Leyden. Rh[facility] Pager [phone] 07/07/2014,1:17 PM  LOS: 2 days   I have seen and examined this patient and agree with plan and documentation by Sonnie Alamo PA-C. Pt had mild diffuse ds by cath, moderate to severe single vessel disease of the circ but not flow limiting.  LVEDP was normal. LVEF actually looked better than on previous determinations.  Pt on dialysis now with a pre HD weight of 59.1 kg (prev EDW 62 kg) New EDW will be around 59.5 with clothing.  BP well controlled now on amlodipine, hydralazine, terazosin.  Homer Pfeifer B,MD 07/07/2014 3:19 PM  Additional Objective Labs: Basic Metabolic Panel:  Recent Labs Lab 07/05/14 1910 07/05/14 1957 07/06/14 0422 07/07/14 0230  NA 141 143 141 139  K 4.5 4.3 3.4* 3.5*  CL 105 110 98 97  CO2 16*  --  26 27  GLUCOSE 161* 167* 79 94  BUN 29* 31* 10 30*  CREATININE 7.13* 8.00* 2.74* 6.07*  CALCIUM 9.4  --  9.4 8.7   Liver Function Tests:  Recent Labs Lab 07/07/14 0230  AST 13  ALT 10  ALKPHOS 65  BILITOT 0.5  PROT 6.2  ALBUMIN 3.1*   CBC:  Recent Labs Lab 07/03/14 1829 07/05/14 1910 07/05/14 1957 07/06/14 0422 07/07/14 0230  WBC 3.8* 12.6*  --  7.3 4.3  NEUTROABS 1.7  --   --   --   --   HGB 11.2* 14.7 19.0* 13.1 11.2*  HCT 33.0* 43.4 56.0* 37.8* 32.9*  MCV 87.3 90.0  --  87.3 88.0  PLT 106* 175  --  142* 131*   Cardiac Enzymes:  Recent Labs Lab 07/05/14 2149 07/06/14 0422 07/06/14 0944  TROPONINI 0.99* 0.98* 0.85*   CBG:  Recent Labs Lab 07/06/14 0544 07/07/14 0034  GLUCAP 88 94  Studies/Results: Dg Chest Port 1 View  07/05/2014   CLINICAL DATA:  Chest pain and short of breath. Skipped dialysis today  EXAM: PORTABLE CHEST - 1 VIEW  COMPARISON:  05/19/2014  FINDINGS: Symmetric bilateral airspace disease has developed since the prior study, with basilar predominance. No significant pleural effusion. Heart size and vascularity appear normal.  IMPRESSION: Symmetric bibasilar airspace disease. Probable pulmonary edema. Pneumonia not excluded.   Electronically Signed   By: Franchot Gallo M.D.   On: 07/05/2014 19:56   Medications: . heparin 1,250 Units/hr (07/07/14 0457)   . adenosine  12 mL Intravenous Once  . [START ON 07/08/2014] amLODipine  10 mg Oral Daily  . antiseptic oral rinse  7 mL Mouth Rinse BID  . aspirin  325 mg Oral Daily  .  calcium acetate  667 mg Oral TID WC  . doxercalciferol  2 mcg Intravenous Q M,W,F-HD  . feeding supplement (RESOURCE BREEZE)  1 Container Oral TID BM  . hydrALAZINE  50 mg Oral BID  . sodium chloride  3 mL Intravenous Q12H  . sodium chloride  3 mL Intravenous Q12H  . sodium chloride  3 mL Intravenous Q12H  . tamsulosin  0.4 mg Oral QPC breakfast  . terazosin  2 mg Oral QHS

## 2014-07-07 NOTE — CV Procedure (Signed)
CARDIAC CATHETERIZATION REPORT  NAME:  Trevor [name]   MRN: [id number] DOB:  1946-11-26   ADMIT DATE: 07/05/2014 Procedure Date: 07/07/2014  INTERVENTIONAL CARDIOLOGIST: Leonie Man, M.D., MS PRIMARY CARE PROVIDER: [name], MD PRIMARY CARDIOLOGIST:  Kirk Ruths, MD  PATIENT:  Trevor [name] is a 68 y.o. male with ESRD & NICM-Chronic Combined Systolic & Diastolic HF (nonobstructive CAD by cardiac catheterization in 2013) & medical non-compliance, who presented with Hypertensive Emergency (SBP > 240) & has had a plateau of mildly elevated Troponin levels most consistent with Type 2 MI.  He does have a grossly abnormal EKG with Anterior ST depressions with Deep T wave inversions most consistent with LVH, but certainly cannot excluded ischemia.  PRE-OPERATIVE DIAGNOSIS:   Principal Problem:   Hypertensive emergency Active Problems:   Nonischemic cardiomyopathy   Systolic & Diastolic CHF, chronic   Acute on chronic combined systolic & Diastolic CHF (congestive heart failure)   NSTEMI (non-ST elevated myocardial infarction) - Presumed Type II in setting of HTN Emergency   History of stroke   Coronary artery disease, non-occlusive: Cath 2013 - Moderate D1 & RCA disease   ESRD on hemodialysis  PROCEDURES PERFORMED:    Left Heart Catheterization with Native Coronary Angiography via  Right Common Femoral Artery (RFA)  Left Ventriculography  Direct Ultrasound Guided Access of RFA  Fractional Flow Reserve (FFR) measurement of mid Circumflex 50-70% lesion. -- 0.88 (Non-significant)  PROCEDURE: The patient was brought to the 2nd Floor [facility] in the fasting state and prepped and draped in the usual sterile fashion for Right Common Femoral artery access.  Sterile technique was used including antiseptics, cap, gloves, gown, hand hygiene, mask and sheet. Skin prep: Chlorhexidine.   Consent: Risks of procedure as well as the alternatives and risks  of each were explained to the (patient/caregiver). Consent for procedure obtained.   Time Out: Verified patient identification, verified procedure, site/side was marked, verified correct patient position, special equipment/implants available, medications/allergies/relevent history reviewed, required imaging and test results available. Performed.  Access:   Right Common Femoral Artery: 5 Fr Sheath -  Fluoroscopically & Direct Ultrasound Guided modified Seldinger Technique  With a very faint pulse and heavily calcified femoral artery, I was unable to access using manual palpation and Ultrasound SmartNeedle, therefore I chose to use direct ultrasound guidance for arterial access to the heavily calcified femoral artery.  Initial attempts were made at access for both right and left common femoral arteries, finally switching to ultrasound guidance for RFA axis.  Left Heart Catheterization: 5Fr Catheters advanced or exchanged over a standard J-wire; JL4 catheter advanced first.  Left Coronary Artery Cineangiography: JL4 Catheter  Right Coronary Artery Cineangiography: JR 4 Catheter   LV Hemodynamics (LV Gram): JR 4 Catheter   FINDINGS:  Hemodynamics:   Central Aortic Pressure / Mean: 101/53/72 mmHg  Left Ventricular Pressure / LVEDP: 102/457 mmHg  Left Ventriculography:  EF: 55-60 %  Wall Motion: Relatively normal  Coronary Anatomy:  Dominance: Technically right dominant with possible L. PDA as well.  Left Main: Large-caliber short vessel that bifurcates into the LAD and Circumflex. Angiographically normal LAD: Normal caliber vessel with diffuse mild luminal regularity is. It gives rise to a major diagonal branch in the mid vessel with a roughly 30% stenosis at the branch. The vessel and reaches around the apex in a tapered fashion. Diffuse luminal irregularities but nonobstructive.  D1: Small moderate caliber vessel with tubular 60% stenosis proximally with 2 subbranches each  with  about 30% stenosis. - Neither vessel is PCI amenable or flow limiting.  Left Circumflex: Large-caliber, possibly codominant vessel that gives rise to a very proximal OM1/Ramus Intermedius is normal caliber and relatively short period. There are 4 relatively small caliber marginal branches with a yeast and trichomoniasis 50-70% stenosis just following the first of the small branches. Beyond this the vessel is relatively normal and bifurcates distally into a posterolateral branch and that vessel which appears to be a posterior descending artery as well. The remainder the vessel is free of disease   RCA: He is a relatively large caliber vessel and it presented as small caliber vessel that bifurcates distally into the Right Posterior Descending Artery (RPDA) and a diminutive  Right Posterior AV Groove Branch (RPAV) that terminates as a small posterolateral branch and AV nodal artery.  Diffuse mild disease throughout. The entire mid segment again appears to have a long 50% area of stenosis. There is no focal lesions there are limiting.  After reviewing the initial angiography, the culprit lesion was thought to be potentially the early mid circumflex lesion of 50-70%.  Preparation were made to proceed with FFR on this lesion.  Fractional Flow Reserve Measurement:  Mid Circumflex 50 at 70% stenosis  Sheath exchanged for 6 Fr  4500 units of IV heparin administered for an ACT> 250 Sec Guide: 6 Fr   XB 3.5  Guidewire: [employer] - initially advanced into the LAD for normalization, then advanced into the distal circumflex, confirmed radiographically  IV adenosine at 100 mcg/kilogram/min was administered for 3 minutes while monitoring the fractional flow reserve ratio  Final ratio 0.88 - not physiologically significant  Based on these data this is wire was removed out of the body and "wire-out" angiography room revealed no perforation or dissection.   Guide Catheter was removed out of the body  over a wire.  Sheath sutured in place, to be removed in the PACU holding area with manual pressure for hemostasis based on ACT.   MEDICATIONS:  Anesthesia:  Local Lidocaine 18 mL both groins  Sedation:  2 mg IV Versed,  Omnipaque Contrast: 60 ml  Anticoagulation:  IV Heparin 4500 Units  PATIENT DISPOSITION:    The patient was transferred to the PACU holding area in a hemodynamicaly stable, chest pain free condition.  The patient tolerated the procedure well, and there were no complications.  EBL:   < 10 ml  The patient was stable before, during, and after the procedure.  POST-OPERATIVE DIAGNOSIS:    Moderate to severe single-vessel disease involving the early mid circumflex with a roughly 60% stenosis, not physiologically significant by FFR. Otherwise nonobstructive CAD throughout  Surprisingly improved LVEF by the ventriculography compared to previous evaluations  Normal LVEDP with borderline systemic hypotension  PLAN OF CARE:  Transferred to nursing unit for post cardiac catheterization care, sheath removal based on ACT  Continue aggressive risk factor modification for coronary artery disease including lipid control, glycemic control and blood pressure control.  Would be stable for discharge from a cardiac standpoint following hemostasis and bed rest.   Leonie Man, M.D., M.S. [facility] [address] [place], [place]  [address]  [phone] 07/07/2014

## 2014-07-08 LAB — CBC
HCT: 33.6 % — ABNORMAL LOW (ref 39.0–52.0)
Hemoglobin: 11.2 g/dL — ABNORMAL LOW (ref 13.0–17.0)
MCH: 29 pg (ref 26.0–34.0)
MCHC: 33.3 g/dL (ref 30.0–36.0)
MCV: 87 fL (ref 78.0–100.0)
Platelets: 120 10*3/uL — ABNORMAL LOW (ref 150–400)
RBC: 3.86 MIL/uL — AB (ref 4.22–5.81)
RDW: 13 % (ref 11.5–15.5)
WBC: 4.1 10*3/uL (ref 4.0–10.5)

## 2014-07-08 NOTE — Progress Notes (Signed)
[facility] Progress Note  Subjective:   No further CP No SOB Had HD yesterday no issues New EDW will be around 59.5 kg Says part of the problem he has at home taking meds correctly is that he is legally blind and can't see his bottles Nursing will call Care Mgmt to see if any assist/pharmacy packaging services/etc available    Objective Filed Vitals:   07/07/14 1800 07/07/14 1803 07/07/14 2100 07/08/14 0500  BP: 142/66 122/64 130/44 134/44  Pulse: 75 78 74 85  Temp:  98 F (36.7 C) 98.4 F (36.9 C) 98.7 F (37.1 C)  TempSrc:  Oral    Resp:  18 16 16   Height:      Weight:  59.8 kg (131 lb 13.4 oz)    SpO2:  98% 96% 98%   Physical Exam General: Thin, alert, cooperative, NAD  Heart: RRR Lungs: Clear Abdomen: soft, NT, + BS Extremities: No LE edema Dialysis Access: Lt AVF + bruit Additional Objective Labs: Basic Metabolic Panel:  Recent Labs Lab 07/05/14 1910 07/05/14 1957 07/06/14 0422 07/07/14 0230  NA 141 143 141 139  K 4.5 4.3 3.4* 3.5*  CL 105 110 98 97  CO2 16*  --  26 27  GLUCOSE 161* 167* 79 94  BUN 29* 31* 10 30*  CREATININE 7.13* 8.00* 2.74* 6.07*  CALCIUM 9.4  --  9.4 8.7   Liver Function Tests:  Recent Labs Lab 07/07/14 0230  AST 13  ALT 10  ALKPHOS 65  BILITOT 0.5  PROT 6.2  ALBUMIN 3.1*   CBC:  Recent Labs Lab 07/03/14 1829 07/05/14 1910  07/06/14 0422 07/07/14 0230 07/08/14 0410  WBC 3.8* 12.6*  --  7.3 4.3 4.1  NEUTROABS 1.7  --   --   --   --   --   HGB 11.2* 14.7  < > 13.1 11.2* 11.2*  HCT 33.0* 43.4  < > 37.8* 32.9* 33.6*  MCV 87.3 90.0  --  87.3 88.0 87.0  PLT 106* 175  --  142* 131* 120*  < > = values in this interval not displayed. Cardiac Enzymes:  Recent Labs Lab 07/05/14 2149 07/06/14 0422 07/06/14 0944  TROPONINI 0.99* 0.98* 0.85*   CBG:  Recent Labs Lab 07/06/14 0544 07/07/14 0034  GLUCAP 88 94   Studies/Results: No results found. Medications:   . amLODipine  10 mg Oral Daily   . antiseptic oral rinse  7 mL Mouth Rinse BID  . aspirin  325 mg Oral Daily  . calcium acetate  667 mg Oral TID WC  . doxercalciferol  2 mcg Intravenous Q M,W,F-HD  . feeding supplement (RESOURCE BREEZE)  1 Container Oral TID BM  . heparin subcutaneous  5,000 Units Subcutaneous 3 times per day  . hydrALAZINE  50 mg Oral BID  . multivitamin  1 tablet Oral QHS  . tamsulosin  0.4 mg Oral QPC breakfast  . terazosin  2 mg Oral QHS   HD Orders: MWF at [facility], 2K/2.25Ca bath. Lt AVF 400/800, Profile #4, Heparin 6200u new EDW 59.5 this admit No ESA. Venofer load completed 7/29. No Vit D  Assessment/Plan:  1. NSTEMI Type 2 (in setting of hypertensive urgency) - Cardiac cath 8/7 - 60% circ/mild diffuse disease but no flow-limiting lesions. No further workup planned.  BP normal now on med regimen 2. ESRD: MWF - New lower EDW at discharge 3. HTN/Volume: SBPs 100s-110s.  4. Anemia - Hgb 11.3. No ESA. Fe load completed  0000000. 5. Metabolic Bone Disease: Ca/Phos controlled. Last PTH in range. Continue Phoslo 1 ac. Not on Vit D.  6. PAF - noted on last admit. He just completed holter study/ ? missed f/u due to hosp admit. 7. Malnutrition - Alb 3.1. Losing wgt/poor po intake. Reliant upon "meals-on-wheels" and eats sporadically. Multivit, supplements. 8. Dispo - ? Possible home today?  RN will call Case Management re pts difficulties with meds (can't see - says has trouble taking correctly - could have been the beginning of the whole hypertensive urgency issue)  Jamal Maes, MD [place] Pager 07/08/2014, 8:25 AM

## 2014-07-08 NOTE — Progress Notes (Signed)
Patient Name: Trevor [name] Date of Encounter: 07/08/2014     Principal Problem:   Hypertensive emergency Active Problems:   Nonischemic cardiomyopathy   History of stroke   Chronic combined systolic and diastolic CHF (congestive heart failure)   Non compliance with medical treatment   Coronary artery disease, non-occlusive: Cath 2013 - Moderate D1 & RCA disease   ESRD on hemodialysis   Acute on chronic combined systolic and diastolic CHF (congestive heart failure)   NSTEMI (non-ST elevated myocardial infarction) - Presumed Type II in setting of HTN Emergency    SUBJECTIVE Patient has done well overnight.  No further chest pain.  CURRENT MEDS . amLODipine  10 mg Oral Daily  . antiseptic oral rinse  7 mL Mouth Rinse BID  . aspirin  325 mg Oral Daily  . calcium acetate  667 mg Oral TID WC  . doxercalciferol  2 mcg Intravenous Q M,W,F-HD  . feeding supplement (RESOURCE BREEZE)  1 Container Oral TID BM  . heparin subcutaneous  5,000 Units Subcutaneous 3 times per day  . hydrALAZINE  50 mg Oral BID  . multivitamin  1 tablet Oral QHS  . tamsulosin  0.4 mg Oral QPC breakfast  . terazosin  2 mg Oral QHS    OBJECTIVE  Filed Vitals:   07/07/14 1800 07/07/14 1803 07/07/14 2100 07/08/14 0500  BP: 142/66 122/64 130/44 134/44  Pulse: 75 78 74 85  Temp:  98 F (36.7 C) 98.4 F (36.9 C) 98.7 F (37.1 C)  TempSrc:  Oral    Resp:  18 16 16   Height:      Weight:  131 lb 13.4 oz (59.8 kg)    SpO2:  98% 96% 98%    Intake/Output Summary (Last 24 hours) at 07/08/14 1010 Last data filed at 07/08/14 0900  Gross per 24 hour  Intake    360 ml  Output   1184 ml  Net   -824 ml   Filed Weights   07/07/14 0500 07/07/14 1403 07/07/14 1803  Weight: 130 lb 4.7 oz (59.1 kg) 134 lb 7.7 oz (61 kg) 131 lb 13.4 oz (59.8 kg)    PHYSICAL EXAM  General: Pleasant, NAD. Neuro: Alert and oriented X 3. Moves all extremities spontaneously. Psych: Normal affect. HEENT:  Normal  Neck: Supple  without bruits or JVD. Lungs:  Resp regular and unlabored, CTA. Heart: RRR no s3, s4, there is a soft systolic murmur at left sternal edge. Abdomen: Soft, non-tender, non-distended, BS + x 4.  Extremities: No clubbing, cyanosis or edema. DP/PT/Radials 2+ and equal bilaterally.  Accessory Clinical Findings  CBC  Recent Labs  07/07/14 0230 07/08/14 0410  WBC 4.3 4.1  HGB 11.2* 11.2*  HCT 32.9* 33.6*  MCV 88.0 87.0  PLT 131* 123456*   Basic Metabolic Panel  Recent Labs  07/06/14 0422 07/07/14 0230  NA 141 139  K 3.4* 3.5*  CL 98 97  CO2 26 27  GLUCOSE 79 94  BUN 10 30*  CREATININE 2.74* 6.07*  CALCIUM 9.4 8.7  MG  --  2.0   Liver Function Tests  Recent Labs  07/07/14 0230  AST 13  ALT 10  ALKPHOS 65  BILITOT 0.5  PROT 6.2  ALBUMIN 3.1*   No results found for this basename: LIPASE, AMYLASE,  in the last 72 hours Cardiac Enzymes  Recent Labs  07/05/14 2149 07/06/14 0422 07/06/14 0944  TROPONINI 0.99* 0.98* 0.85*   BNP No components found with this basename: POCBNP,  D-Dimer No results found for this basename: DDIMER,  in the last 72 hours Hemoglobin A1C  Recent Labs  07/07/14 0230  HGBA1C 5.2   Fasting Lipid Panel  Recent Labs  07/07/14 0230  CHOL 103  HDL 36*  LDLCALC 50  TRIG 87  CHOLHDL 2.9   Thyroid Function Tests No results found for this basename: TSH, T4TOTAL, FREET3, T3FREE, THYROIDAB,  in the last 72 hours  TELE  Normal sinus rhythm with occasional PACs  ECG    Radiology/Studies  Dg Chest Port 1 View  07/05/2014   CLINICAL DATA:  Chest pain and short of breath. Skipped dialysis today  EXAM: PORTABLE CHEST - 1 VIEW  COMPARISON:  05/19/2014  FINDINGS: Symmetric bilateral airspace disease has developed since the prior study, with basilar predominance. No significant pleural effusion. Heart size and vascularity appear normal.  IMPRESSION: Symmetric bibasilar airspace disease. Probable pulmonary edema. Pneumonia not excluded.    Electronically Signed   By: Franchot Gallo M.D.   On: 07/05/2014 19:56    ASSESSMENT AND PLAN Moderate to severe single-vessel disease involving the early mid circumflex with a roughly 60% stenosis, not physiologically significant by FFR. Otherwise nonobstructive CAD throughout  Surprisingly improved LVEF by the ventriculography compared to previous evaluations  Normal LVEDP with borderline systemic hypotension  ESRD dialysed MWF No further cardiac tests planned.  Continue aggressive risk factor modification for coronary artery disease including lipid control, glycemic control and blood pressure control.  Okay for discharge home from cardiac standpoint.     Signed, Darlin Coco MD

## 2014-07-08 NOTE — Progress Notes (Signed)
Pharmacist Heart Failure Core Measure Documentation  Assessment: Trevor [name] has an EF documented as 40% on 05/20/2014 by Echo.  Rationale: Heart failure patients with left ventricular systolic dysfunction (LVSD) and an EF < 40% should be prescribed an angiotensin converting enzyme inhibitor (ACEI) or angiotensin receptor blocker (ARB) at discharge unless a contraindication is documented in the medical record.  This patient is not currently on an ACEI or ARB for HF.  This note is being placed in the record in order to provide documentation that a contraindication to the use of these agents is present for this encounter.  ACE Inhibitor or Angiotensin Receptor Blocker is contraindicated (specify all that apply)  []   ACEI allergy AND ARB allergy []   Angioedema []   Moderate or severe aortic stenosis []   Hyperkalemia [x]   Hypotension (Lisinopril was stopped at an earlier date due to hypotension) []   Renal artery stenosis []   Worsening renal function, preexisting renal disease or dysfunction  Hildred Laser, Pharm D 07/08/2014 2:33 PM

## 2014-07-08 NOTE — Progress Notes (Signed)
Trevor [name] - Stepdown/ICU TEAM Progress Note  Trevor [name] [id number] DOB: Apr 13, 1946 DOA: 07/05/2014 PCP: [name], MD  Admit HPI / Brief Narrative: 68 y.o. M with Hx HTN, non ischemic cardiomyopathy, as well as ESRD w/ dialysis M/W/F who presented to the ED in respiratory distress. He related that he "wasn't feeling well" and skipped his dialysis. Unfortunatly this caused him to feel progressively worse and he eventually presented with low O2 sats, labored breathing, tachypnea, and chest pain / pressure. Blood pressures were in the 200's- CXR showed pulmonary edema.  He was been treated with bipap and nitro with reasonable results. He required emergent dialysis.  HPI/Subjective: No new complaints this morning.  After reading Dr. Sanda Klein note, I questioned pt about his living situation.  He admits that he can not see his medications or read the labels, and frequently misses doses or fears that he mixes up doses due to this challenge.  He further admits that his vision makes it quite difficult for him to fix his own meals, and admits that he often goes w/o a meal if [employer] is not able to visit him.  He denies current cp, n/v, abdom pain, or sob.     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  BP now reasonably well controlled - follow trend w/o change    Combined systolic and diastolic CHF with acute exacerbation  volume management per HD - well compensated at present - dry weight has been adjusted by Nephrology  NSTEMI w/ known CAD  Cardiology following - now s/p cath revealing moderate to severe single-vessel disease involving the early mid circumflex with a roughly 60% stenosis o/w nonobstructive CAD throughout - no further eval indicated  ?Parox Afib  NSR at present - complete outpt w/u as previously planned - no evidence of arrythmia at present   End stage renal disease on HD M/W/F HD per  Nephrology  Noncompliance Patient counseled on sequela of continuing to be noncompliant with medication and HD to include death - it now appears his noncompliance is more related to social challenges than irresponsibility - given the information he has explained to me today, I do not feel it is safe to d/c Trevor [name] to his home alone - will ask for PT/OT evals, and ask CM/CSW to investigate ?assisted living placement v/s high level home health assist   Severe malnutrition in the context of chronic illness  Appears to be related to inability to prepare his own meals - see discussion above   Code Status: FULL Family Communication: no family present at time of exam Disposition Plan: med/surg appropriate - address social issues which are preventing a safe d/c plan   Consultants: Dr. Jamal Maes (Nephrology) Dr. Darlin Coco (Cardiology)  Procedures: 6/20 echocardiogram - Left ventricle: moderate LVH. LVEF= 40% Diffuse hypokinesis - (grade [name] diastolic dysfunction) - Left atrium:  mildly dilated  8/7 - cardiac cath  Antibiotics: NA  DVT prophylaxis: SQ heparin   Objective: VITAL SIGNS: Blood pressure 151/69, pulse 85, temperature 98.7 F (37.[name] C), temperature source Oral, resp. rate 16, height 5\' 10"  ([name].778 m), weight 59.8 kg (131 lb 13.4 oz), SpO2 98.00%.  Intake/Output Summary (Last 24 hours) at 07/08/14 1248 Last data filed at 07/08/14 0900  Gross per 24 hour  Intake    360 ml  Output   1184 ml  Net   -824 ml   Exam: General: No acute respiratory distress Lungs: Clear to auscultation  bilaterally without wheezes or crackles Cardiovascular: Regular rate and rhythm without murmur gallop or rub  Abdomen: Nontender, nondistended, soft, bowel sounds positive, no rebound, no ascites, no appreciable mass Extremities: No significant cyanosis, clubbing, or edema bilateral lower extremities  Data Reviewed: Basic Metabolic Panel:  Recent Labs Lab 07/03/14 1829  07/05/14 1910 07/05/14 1957 07/06/14 0422 07/07/14 0230  NA 141 141 143 141 139  K 3.[name]* 4.5 4.3 3.4* 3.5*  CL 104 105 110 98 97  CO2 26 16*  --  26 27  GLUCOSE 82 161* 167* 79 94  BUN 7 29* 31* 10 30*  CREATININE 3.12* 7.13* 8.00* 2.74* 6.07*  CALCIUM 8.2* 9.4  --  9.4 8.7  MG  --   --   --   --  2.0   Liver Function Tests:  Recent Labs Lab 07/07/14 0230  AST 13  ALT 10  ALKPHOS 65  BILITOT 0.5  PROT 6.2  ALBUMIN 3.[name]*   CBC:  Recent Labs Lab 07/03/14 1829 07/05/14 1910 07/05/14 1957 07/06/14 0422 07/07/14 0230 07/08/14 0410  WBC 3.8* 12.6*  --  7.3 4.3 4.[name]  NEUTROABS [name].7  --   --   --   --   --   HGB 11.2* 14.7 19.0* 13.[name] 11.2* 11.2*  HCT 33.0* 43.4 56.0* 37.8* 32.9* 33.6*  MCV 87.3 90.0  --  87.3 88.0 87.0  PLT 106* 175  --  142* 131* 120*   Cardiac Enzymes:  Recent Labs Lab 07/05/14 2149 07/06/14 0422 07/06/14 0944  TROPONINI 0.99* 0.98* 0.85*   CBG:  Recent Labs Lab 07/06/14 0544 07/07/14 0034  GLUCAP 88 94    Recent Results (from the past 240 hour(s))  MRSA PCR SCREENING     Status: None   Collection Time    07/06/14  2:02 AM      Result Value Ref Range Status   MRSA by PCR NEGATIVE  NEGATIVE Final   Comment:            The GeneXpert MRSA Assay (FDA     approved for NASAL specimens     only), is one component of a     comprehensive MRSA colonization     surveillance program. It is not     intended to diagnose MRSA     infection nor to guide or     monitor treatment for     MRSA infections.     Studies:  Recent x-ray studies have been reviewed in detail by the Attending Physician  Scheduled Meds:  Scheduled Meds: . amLODipine  10 mg Oral Daily  . antiseptic oral rinse  7 mL Mouth Rinse BID  . aspirin  325 mg Oral Daily  . calcium acetate  667 mg Oral TID WC  . doxercalciferol  2 mcg Intravenous Q M,W,F-HD  . feeding supplement (RESOURCE BREEZE)  [name] Container Oral TID BM  . heparin subcutaneous  5,000 Units Subcutaneous 3  times per day  . hydrALAZINE  50 mg Oral BID  . multivitamin  [name] tablet Oral QHS  . tamsulosin  0.4 mg Oral QPC breakfast  . terazosin  2 mg Oral QHS    Time spent on care of this patient: 35 mins  [name], MD [facility] For Consults/Admissions - Flow Manager - [phone] Office  [phone] Pager [phone]  On-Call/Text Page:      [email or web address]      password [facility]  07/08/2014, 12:48 PM   LOS: 3 days

## 2014-07-09 MED ORDER — BISACODYL 5 MG PO TBEC
10.0000 mg | DELAYED_RELEASE_TABLET | Freq: Once | ORAL | Status: AC
  Administered 2014-07-09: 10 mg via ORAL
  Filled 2014-07-09: qty 2

## 2014-07-09 MED ORDER — ALBUTEROL SULFATE (2.5 MG/3ML) 0.083% IN NEBU: 2.5000 mg | INHALATION_SOLUTION | RESPIRATORY_TRACT | Status: DC | PRN

## 2014-07-09 NOTE — Evaluation (Signed)
Physical Therapy Evaluation Patient Details Name: Trevor [name] MRN: [id number] DOB: 1945/12/21 Today's Date: 07/09/2014   History of Present Illness    68 y.o. M with Hx HTN, non ischemic cardiomyopathy, as well as ESRD w/ dialysis M/W/F who presented to the ED in respiratory distress. He related that he "wasn't feeling well" and skipped his dialysis. Unfortunatly this caused him to feel progressively worse and he eventually presented with low O2 sats, labored breathing, tachypnea, and chest pain / pressure. Blood pressures were in the 200's- CXR showed pulmonary edema. He was been treated with bipap and nitro with reasonable results. He required emergent dialysis.   Clinical Impression  Pt admitted with respiratory issues and HTN. Pt currently with functional limitations due to the deficits listed below (see PT Problem List). Pt feels he struggles at home and will need NHP for rehab prior to d/c.  Ultimately, A living would be a great plan for pt as he appears to have difficulty with his meds noted from prior admission.  Needs incr assist at d/c for better function.   Pt will benefit from skilled PT to increase their independence and safety with mobility to allow discharge to the venue listed below.     Follow Up Recommendations [name];Supervision/Assistance - 24 hour    Equipment Recommendations  Other (comment) (TBA)    Recommendations for Other Services       Precautions / Restrictions Precautions Precautions: Fall Restrictions Weight Bearing Restrictions: No      Mobility  Bed Mobility Overal bed mobility: Needs Assistance Bed Mobility: Supine to Sit     Supine to sit: Min assist        Transfers Overall transfer level: Needs assistance Equipment used: Rolling walker (2 wheeled) Transfers: Sit to/from Stand Sit to Stand: Min assist         General transfer comment: cues for hand placement and steadying assist upon intittial stand.     Ambulation/Gait Ambulation/Gait assistance: Min assist Ambulation Distance (Feet): 40 Feet Assistive device: Rolling walker (2 wheeled) Gait Pattern/deviations: Step-through pattern;Decreased stride length   Gait velocity interpretation: Below normal speed for age/gender General Gait Details: Pt ambulates with RW with fair safety. Cannot tolerate challenges to balance and is unsteady at times.  Fatigues and begins to need more assist.    Stairs            Wheelchair Mobility    Modified Rankin (Stroke Patients Only)       Balance Overall balance assessment: Needs assistance         Standing balance support: Bilateral upper extremity supported;During functional activity Standing balance-Leahy Scale: Poor Standing balance comment: Needs UE support of RW to stand statically.                               Pertinent Vitals/Pain Pain Assessment: No/denies pain.  On 2LO2 on arrival with sats 99%.  With O2 removed sats 96% with activity.  Replaced O2 after treatment.      Home Living Family/patient expects to be discharged to:: Private residence Living Arrangements: Alone   Type of Home: Apartment Home Access: Level entry     Home Layout: One [place]: Clinical cytogeneticist - 4 wheels;Wheelchair - manual      Prior Function Level of Independence: Independent with assistive device(s)         Comments: Pt states he  could walk 100 feet with RW at home.  Pt states he struggles to manage at home.      Hand Dominance   Dominant Hand: Right    Extremity/Trunk Assessment   Upper Extremity Assessment: Defer to OT evaluation           Lower Extremity Assessment: Generalized weakness         Communication   Communication: No difficulties  Cognition Arousal/Alertness: Awake/alert Behavior During Therapy: WFL for tasks assessed/performed Overall Cognitive Status: Within Functional Limits for tasks assessed                       General Comments      Exercises        Assessment/Plan    PT Assessment Patient needs continued PT services  PT Diagnosis Generalized weakness   PT Problem List Decreased strength;Decreased activity tolerance;Decreased balance;Decreased mobility;Decreased knowledge of use of DME;Decreased safety awareness;Decreased knowledge of precautions  PT Treatment Interventions DME instruction;Gait training;Functional mobility training;Therapeutic activities;Therapeutic exercise;Balance training;Patient/family education   PT Goals (Current goals can be found in the Care Plan section) Acute Rehab PT Goals Patient Stated Goal: to go get therapy and then home PT Goal Formulation: With patient Time For Goal Achievement: 07/16/14 Potential to Achieve Goals: Good    Frequency Min 3X/week   Barriers to discharge Decreased caregiver support      Co-evaluation               End of Session Equipment Utilized During Treatment: Gait belt;Oxygen Activity Tolerance: Patient limited by fatigue Patient left: in bed;with call bell/phone within reach;with bed alarm set Nurse Communication: Mobility status         Time: [id number] PT Time Calculation (min): 23 min   Charges:   PT Evaluation $Initial PT Evaluation Tier I: 1 Procedure PT Treatments $Gait Training: 8-22 mins   PT G Codes:          INGOLD,Noland Pizano Jul 29, 2014, 1:59 PM [facility] [phone] (pager)

## 2014-07-09 NOTE — Progress Notes (Signed)
[name] Progress Note  Subjective:   No AP or SOB "I'm just tired" New EDW will be around 59.5 kg Says part of the problem he has at home taking meds correctly is that he is legally blind and can't see his bottles Primary service is looking into what assistance might be available for him Pt acknowledges today that he gets his meds mixed up Uses meals on wheels Has family in town but says "they have their own families and agendas" and aren't really there for the day to day    Objective Filed Vitals:   07/08/14 1010 07/08/14 1338 07/08/14 2100 07/09/14 0500  BP: 151/69 150/64 161/65 163/74  Pulse:  78 72 87  Temp:  98.6 F (37 C) 98 F (36.7 C) 98.5 F (36.9 C)  TempSrc:  Oral    Resp:  14 16 16   Height:      Weight:    58.922 kg (129 lb 14.4 oz)  SpO2:  99% 99% 98%   Physical Exam BP 163/74  Pulse 87  Temp(Src) 98.5 F (36.9 C) (Oral)  Resp 16  Ht 5\' 10"  (1.778 m)  Wt 58.922 kg (129 lb 14.4 oz)  BMI 18.64 kg/m2  SpO2 98% General: Thin, alert, cooperative, NAD  Heart: Regular S1S2 No S3 Lungs: Clear Abdomen: soft, NT, + BS Extremities: No LE edema Dialysis Access: Lt AVF + bruit Additional Objective Labs: Basic Metabolic Panel:  Recent Labs Lab 07/05/14 1910 07/05/14 1957 07/06/14 0422 07/07/14 0230  NA 141 143 141 139  K 4.5 4.3 3.4* 3.5*  CL 105 110 98 97  CO2 16*  --  26 27  GLUCOSE 161* 167* 79 94  BUN 29* 31* 10 30*  CREATININE 7.13* 8.00* 2.74* 6.07*  CALCIUM 9.4  --  9.4 8.7   Liver Function Tests:  Recent Labs Lab 07/07/14 0230  AST 13  ALT 10  ALKPHOS 65  BILITOT 0.5  PROT 6.2  ALBUMIN 3.1*   CBC:  Recent Labs Lab 07/03/14 1829 07/05/14 1910  07/06/14 0422 07/07/14 0230 07/08/14 0410  WBC 3.8* 12.6*  --  7.3 4.3 4.1  NEUTROABS 1.7  --   --   --   --   --   HGB 11.2* 14.7  < > 13.1 11.2* 11.2*  HCT 33.0* 43.4  < > 37.8* 32.9* 33.6*  MCV 87.3 90.0  --  87.3 88.0 87.0  PLT 106* 175  --  142* 131* 120*  <  > = values in this interval not displayed. Cardiac Enzymes:  Recent Labs Lab 07/05/14 2149 07/06/14 0422 07/06/14 0944  TROPONINI 0.99* 0.98* 0.85*   CBG:  Recent Labs Lab 07/06/14 0544 07/07/14 0034  GLUCAP 88 94   Studies/Results: No results found. Medications:   . amLODipine  10 mg Oral Daily  . antiseptic oral rinse  7 mL Mouth Rinse BID  . aspirin  325 mg Oral Daily  . calcium acetate  667 mg Oral TID WC  . doxercalciferol  2 mcg Intravenous Q M,W,F-HD  . feeding supplement (RESOURCE BREEZE)  1 Container Oral TID BM  . heparin subcutaneous  5,000 Units Subcutaneous 3 times per day  . hydrALAZINE  50 mg Oral BID  . multivitamin  1 tablet Oral QHS  . tamsulosin  0.4 mg Oral QPC breakfast  . terazosin  2 mg Oral QHS   HD Orders: MWF at [place], 2K/2.25Ca bath. Lt AVF 400/800, Profile #4, Heparin 6200u new lower EDW  59.5 this admit No ESA. Venofer load completed 7/29. No Vit D  Assessment/Plan:  1. NSTEMI Type 2 (in setting of hypertensive urgency) -   Supine BP's appear to be creeping up - check orthostatics (has had prior episodes of orthostatic syncope)  and if not orthostatic may need to add ACE 2. CAD Cardiac cath 8/7 - 60% circ/mild diffuse disease but no flow-limiting lesions. No further workup planned. 3. Systolic/diastolic CHF  (EF 40 by echo ->improved to 55-60% by ventriculogram with cath this admission) - volume control.  Has lower EDW now 4. ESRD: MWF - New lower EDW at discharge 5. HTN/Volume: See #1.  6. Anemia - Hgb 11.'s. No ESA. Fe load completed 0000000. 7. Metabolic Bone Disease. Last PTH in range. Continue Phoslo 1 ac. Not on Vit D Check with HD AM.  8. PAF - noted on last admit. He just completed holter study and states the monitor is at home on his coffee table.  Missed f/u due to hosp admit. 9. Malnutrition - Alb 3.1. Losing wgt/poor po intake. Reliant upon "meals-on-wheels" and eats sporadically. Multivit, supplements. 10. Social -  Dr.  Thereasa Solo is asking CM/CSW to look into options for Assisted Living vs [facility]   Jamal Maes, MD [place] Pager 07/09/2014, 9:56 AM

## 2014-07-09 NOTE — Progress Notes (Signed)
TEAM 1 - Stepdown/ICU TEAM Progress Note  Trevor [name] [id number] DOB: 1946-04-09 DOA: 07/05/2014 PCP: Trevor Potts, MD  Admit HPI / Brief Narrative: 68 y.o. M with Hx HTN, non ischemic cardiomyopathy, as well as ESRD w/ dialysis M/W/F who presented to the ED in respiratory distress. He related that he "wasn't feeling well" and skipped his dialysis. Unfortunatly this caused him to feel progressively worse and he eventually presented with low O2 sats, labored breathing, tachypnea, and chest pain / pressure. Blood pressures were in the 200's- CXR showed pulmonary edema.  He was been treated with bipap and nitro with reasonable results. He required emergent dialysis.  HPI/Subjective: Pt reports feeling SOB this morning.  No chest pain.  RN notes sats have been stable.  Pt has not been getting out of bed to any signif extent.  Denies abdom pain, or leg pain.    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  volume management per HD - well compensated at present - dry weight has been adjusted by Nephrology - no clnical evidence to suggest current volume overload as cause of dyspnea  Dyspnea Unclear etiology - ?atx - up to chair - no exam findings to suggest volume overload - no chest pain and no LE edema to suggest DVT/PE - NSR to ascultation - monitor - sats are normal   NSTEMI w/ known CAD  Cardiology following - now s/p cath revealing moderate to severe single-vessel disease involving the early mid circumflex with a roughly 60% stenosis o/w nonobstructive CAD throughout - no further eval indicated  ?Parox Afib  NSR at present - complete outpt w/u as previously planned - no evidence of arrythmia at present   End stage renal disease on HD M/W/F HD per  Nephrology  Noncompliance Patient counseled on sequela of continuing to be noncompliant with medication and HD to include death - it now appears his noncompliance is more related to social challenges than irresponsibility - given the information he has explained to me today, I do not feel it is safe to d/c [name] to his home alone - awaiting PT/OT evals - CM/CSW to investigate ?assisted living placement v/s high level home health assist   Severe malnutrition in the context of chronic illness  Appears to be related to inability to prepare his own meals - see discussion above   Code Status: FULL Family Communication: no family present at time of exam Disposition Plan: med/surg - address social issues which are preventing a safe d/c plan   Consultants: Dr. Jamal [name] (Nephrology) Dr. Darlin [name] (Cardiology)  Procedures: 6/20 echocardiogram - Left ventricle: moderate LVH. LVEF= 40% Diffuse hypokinesis - (grade 1 diastolic dysfunction) - Left atrium:  mildly dilated  8/7 - cardiac cath  Antibiotics: NA  DVT prophylaxis: SQ heparin   Objective: VITAL SIGNS: Blood pressure 163/74, pulse 87, temperature 98.5 F (36.9 C), temperature source Oral, resp. rate 16, height 5\' 10"  (1.778 m), weight 58.922 kg (129 lb 14.4 oz), SpO2 98.00%.  Intake/Output Summary (Last 24 hours) at 07/09/14 1131 Last data filed at 07/09/14 0500  Gross per 24 hour  Intake    120 ml  Output    525 ml  Net   -405 ml   Exam: General: No acute respiratory distress at time of exam Lungs: Clear to  auscultation bilaterally without wheezes or crackles Cardiovascular: Regular rate and rhythm without murmur gallop or rub - no JVD Abdomen: Nontender, nondistended, soft, bowel sounds positive, no rebound, no ascites, no appreciable mass Extremities: No significant cyanosis, clubbing, or edema bilateral lower extremities  Data Reviewed: Basic Metabolic Panel:  Recent Labs Lab 07/03/14 1829  07/05/14 1910 07/05/14 1957 07/06/14 0422 07/07/14 0230  NA 141 141 143 141 139  K 3.1* 4.5 4.3 3.4* 3.5*  CL 104 105 110 98 97  CO2 26 16*  --  26 27  GLUCOSE 82 161* 167* 79 94  BUN 7 29* 31* 10 30*  CREATININE 3.12* 7.13* 8.00* 2.74* 6.07*  CALCIUM 8.2* 9.4  --  9.4 8.7  MG  --   --   --   --  2.0   Liver Function Tests:  Recent Labs Lab 07/07/14 0230  AST 13  ALT 10  ALKPHOS 65  BILITOT 0.5  PROT 6.2  ALBUMIN 3.1*   CBC:  Recent Labs Lab 07/03/14 1829 07/05/14 1910 07/05/14 1957 07/06/14 0422 07/07/14 0230 07/08/14 0410  WBC 3.8* 12.6*  --  7.3 4.3 4.1  NEUTROABS 1.7  --   --   --   --   --   HGB 11.2* 14.7 19.0* 13.1 11.2* 11.2*  HCT 33.0* 43.4 56.0* 37.8* 32.9* 33.6*  MCV 87.3 90.0  --  87.3 88.0 87.0  PLT 106* 175  --  142* 131* 120*   Cardiac Enzymes:  Recent Labs Lab 07/05/14 2149 07/06/14 0422 07/06/14 0944  TROPONINI 0.99* 0.98* 0.85*   CBG:  Recent Labs Lab 07/06/14 0544 07/07/14 0034  GLUCAP 88 94    Recent Results (from the past 240 hour(s))  MRSA PCR SCREENING     Status: None   Collection Time    07/06/14  2:02 AM      Result Value Ref Range Status   MRSA by PCR NEGATIVE  NEGATIVE Final   Comment:            The GeneXpert MRSA Assay (FDA     approved for NASAL specimens     only), is one component of a     comprehensive MRSA colonization     surveillance program. It is not     intended to diagnose MRSA     infection nor to guide or     monitor treatment for     MRSA infections.     Studies:  Recent x-ray studies have been reviewed in detail by the Attending Physician  Scheduled Meds:  Scheduled Meds: . amLODipine  10 mg Oral Daily  . antiseptic oral rinse  7 mL Mouth Rinse BID  . aspirin  325 mg Oral Daily  . calcium acetate  667 mg Oral TID WC  . doxercalciferol  2 mcg Intravenous Q M,W,F-HD  . feeding supplement (RESOURCE BREEZE)  1 Container Oral TID BM  . heparin subcutaneous  5,000 Units Subcutaneous 3  times per day  . hydrALAZINE  50 mg Oral BID  . multivitamin  1 tablet Oral QHS  . tamsulosin  0.4 mg Oral QPC breakfast  . terazosin  2 mg Oral QHS    Time spent on care of this patient: 35 mins  Trevor Altes, MD [facility] For Consults/Admissions - Flow Manager - [phone] Office  [phone] Pager [phone]  On-Call/Text Page:      [email or web address]      password [facility]  07/09/2014, 11:31 AM   LOS: 4  days

## 2014-07-10 LAB — RENAL FUNCTION PANEL
ANION GAP: 17 — AB (ref 5–15)
Albumin: 3.3 g/dL — ABNORMAL LOW (ref 3.5–5.2)
BUN: 48 mg/dL — ABNORMAL HIGH (ref 6–23)
CALCIUM: 9.6 mg/dL (ref 8.4–10.5)
CO2: 22 meq/L (ref 19–32)
Chloride: 99 mEq/L (ref 96–112)
Creatinine, Ser: 8.76 mg/dL — ABNORMAL HIGH (ref 0.50–1.35)
GFR calc non Af Amer: 5 mL/min — ABNORMAL LOW (ref >90)
GFR, EST AFRICAN AMERICAN: 6 mL/min — AB (ref >90)
GLUCOSE: 122 mg/dL — AB (ref 70–99)
Phosphorus: 4.7 mg/dL — ABNORMAL HIGH (ref 2.3–4.6)
Potassium: 4.5 mEq/L (ref 3.7–5.3)
SODIUM: 138 meq/L (ref 137–147)

## 2014-07-10 LAB — CBC
HCT: 31.4 % — ABNORMAL LOW (ref 39.0–52.0)
HEMOGLOBIN: 10.8 g/dL — AB (ref 13.0–17.0)
MCH: 30 pg (ref 26.0–34.0)
MCHC: 34.4 g/dL (ref 30.0–36.0)
MCV: 87.2 fL (ref 78.0–100.0)
Platelets: 128 10*3/uL — ABNORMAL LOW (ref 150–400)
RBC: 3.6 MIL/uL — AB (ref 4.22–5.81)
RDW: 12.9 % (ref 11.5–15.5)
WBC: 3.8 10*3/uL — ABNORMAL LOW (ref 4.0–10.5)

## 2014-07-10 MED ORDER — DOXERCALCIFEROL 4 MCG/2ML IV SOLN
INTRAVENOUS | Status: AC
  Administered 2014-07-10: 2 ug via INTRAVENOUS
  Filled 2014-07-10: qty 2

## 2014-07-10 NOTE — Progress Notes (Addendum)
Patient doing ok from cardiology perspective. Per Dr. Sherryl Barters note on 07/08/2014, ok to discharge. Pending discharge due to social concern and concern that patient is unable to manage his own medications. PT consulted, recommended [name]. 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 No further recommendation.  Follow up has been scheduled.  Hilbert Corrigan PA Pager: [phone]   Attending Note:   The patient was seen and examined.  Agree with assessment and plan as noted above.  Changes made to the above note as needed.  Will sign off.  Call for questions.   Thayer Headings, Brooke Bonito., MD, [facility] 07/10/2014, 10:35 AM [address]  [place] Pager [phone]

## 2014-07-10 NOTE — Progress Notes (Signed)
Physical Therapy Treatment Patient Details Name: Trevor [name] MRN: [id number] DOB: 1946/09/14 Today's Date: 07/10/2014    History of Present Illness 68 y.o. M with Hx HTN, non ischemic cardiomyopathy, as well as ESRD w/ dialysis M/W/F who presented to the ED in respiratory distress. He related that he "wasn't feeling well" and skipped his dialysis. Unfortunatly this caused him to feel progressively worse and he eventually presented with low O2 sats, labored breathing, tachypnea, and chest pain / pressure. Blood pressures were in the 200's- CXR showed pulmonary edema.     PT Comments    Pt with excellent progression with mobility and gait today. Able to tolerate hall ambulation and stairs without dyspnea, chest pain or fatigue but decreased gait speed. Discussed D/C options with pt who states he feels better and would like help in his home. ALF would be a better long term option to address all pt issues but he seems conflicted as to what he feels would be best. Will continue to follow and recommend daily mobility with staff.   Follow Up Recommendations  Supervision - Intermittent;Home health PT;Other (comment) (ALF)     Equipment Recommendations  None recommended by PT    Recommendations for Other Services       Precautions / Restrictions Precautions Precautions: Fall Restrictions Weight Bearing Restrictions: No    Mobility  Bed Mobility Overal bed mobility: Needs Assistance Bed Mobility: Supine to Sit     Supine to sit: Supervision     General bed mobility comments: Pt in chair on arrival  Transfers Overall transfer level: Needs assistance Equipment used: Rolling walker (2 wheeled) Transfers: Sit to/from [employer] Transfers Sit to Stand: Supervision Stand pivot transfers: Min assist       General transfer comment: cues for hand placement  Ambulation/Gait Ambulation/Gait assistance: Supervision Ambulation Distance (Feet): 400 Feet Assistive device:  Rolling walker (2 wheeled) Gait Pattern/deviations: Step-through pattern;Decreased stride length   Gait velocity interpretation: Below normal speed for age/gender General Gait Details: Pt ambulates with RW with fair safety and steadiness but did not provide challenges   Stairs Stairs: Yes Stairs assistance: Min guard Stair Management: Step to pattern;One rail Right;Forwards Number of Stairs: 3    Wheelchair Mobility    Modified Rankin (Stroke Patients Only)       Balance Overall balance assessment: Needs assistance Sitting-balance support: Feet supported Sitting balance-Leahy Scale: Fair       Standing balance-Leahy Scale: Poor Standing balance comment: Needs UE support to maintain balance, noted one LOB posteriorly when standing at the sink during grooming tasks.                     Cognition Arousal/Alertness: Awake/alert Behavior During Therapy: WFL for tasks assessed/performed Overall Cognitive Status: Within Functional Limits for tasks assessed                      Exercises General Exercises - Lower Extremity Long Arc Quad: AROM;Seated;Both;15 reps Hip Flexion/Marching: AROM;Seated;Both;15 reps    General Comments        Pertinent Vitals/Pain Pain Assessment: No/denies pain     Home Living Family/patient expects to be discharged to:: Private residence Living Arrangements: Alone   Type of Home: Apartment Home Access: Level entry   Home Layout: One level Home Equipment: Clinical cytogeneticist - 4 wheels;Wheelchair - manual      Prior Function Level of Independence: Independent with assistive device(s)      Comments: Pt states he  could  walk 100 feet with RW at home.  Pt states he struggles to manage at home.    PT Goals (current goals can now be found in the care plan section) Acute Rehab PT Goals Patient Stated Goal: To get his strength back. Progress towards PT goals: Progressing toward goals    Frequency  Min 3X/week    PT  Plan Discharge plan needs to be updated    Co-evaluation             End of Session Equipment Utilized During Treatment: Gait belt Activity Tolerance: Patient tolerated treatment well Patient left: in chair;with call bell/phone within reach;with chair alarm set     Time: [id number] PT Time Calculation (min): 28 min  Charges:  $Gait Training: 8-22 mins $Therapeutic Exercise: 8-22 mins                    G Codes:      Melford Aase 2014/07/11, 12:45 PM Elwyn Reach, [place]

## 2014-07-10 NOTE — Evaluation (Signed)
Occupational Therapy Evaluation Patient Details Name: Trevor [name] MRN: [id number] DOB: 1946-02-22 Today's Date: 07/10/2014    History of Present Illness 68 y.o. M with Hx HTN, non ischemic cardiomyopathy, as well as ESRD w/ dialysis M/W/F who presented to the ED in respiratory distress. He related that he "wasn't feeling well" and skipped his dialysis. Unfortunatly this caused him to feel progressively worse and he eventually presented with low O2 sats, labored breathing, tachypnea, and chest pain / pressure. Blood pressures were in the 200's- CXR showed pulmonary edema.    Clinical Impression   Pt is currently moderately deconditioned and exhibits the need for min assist to perform basic toileting tasks and selfcare.  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  Feel he will benefit from acute care OT to help increase overall independence but feel he needs 24 hour supervision if discharged home.  Since pt lives alone feel he will need [name] for follow-up rehab.  ALF would be the best possible discharge plan long term.  Feel pt will also benefit from referral to services for the blind to help with adaptations secondary to his limited vision.  Will continue to follow.     Follow Up Recommendations  [name];Supervision/Assistance - 24 hour    Equipment Recommendations  3 in 1 bedside comode       Precautions / Restrictions Precautions Precautions: Fall Restrictions Weight Bearing Restrictions: No      Mobility Bed Mobility Overal bed mobility: Needs Assistance Bed Mobility: Supine to Sit     Supine to sit: Supervision        Transfers Overall transfer level: Needs assistance Equipment used: Rolling walker (2 wheeled) Transfers: Sit to/from [employer] Sit to Stand: Min assist Stand pivot transfers: Min assist            Balance Overall balance assessment:  Needs assistance Sitting-balance support: Feet supported Sitting balance-Leahy Scale: Fair       Standing balance-Leahy Scale: Poor Standing balance comment: Needs UE support to maintain balance, noted one LOB posteriorly when standing at the sink during grooming tasks.                             ADL Overall ADL's : Needs assistance/impaired Eating/Feeding: Independent;Sitting   Grooming: Wash/dry hands;Wash/dry face;Minimal assistance   Upper Body Bathing: Minimal assitance;Standing   Lower Body Bathing: Minimal assistance;Sit to/from stand   Upper Body Dressing : Minimal assistance;Sitting;Standing   Lower Body Dressing: Sit to/from stand;Minimal assistance   Toilet Transfer: Minimal assistance;RW;Grab bars;Ambulation   Toileting- Clothing Manipulation and Hygiene: Minimal assistance;Sit to/from stand       Functional mobility during ADLs: Minimal assistance;Rolling walker;Cueing for safety General ADL Comments: Pt with generalized weakness noted with all selfcare tasks.  Reports not having any assistance at home except for meals on wheels.  Will benefit from  ALF as pt has significant visual limitations secondary to previous CVA and has had history of falls as well.      Vision Eye Alignment: Within Functional Limits Alignment/Gaze Preference: Within Defined Limits Ocular Range of Motion: Restricted on the left Tracking/Visual Pursuits: Decreased smoothness of horizontal tracking;Decreased smoothness of vertical tracking         Additional Comments: Pt with severe visual limitations including decreased visual acuity both near and far.  Decreased visual fields noted bilaterally as well.  In order for pt to identify therapists fingers approximately 12 inches in front of him at midline, he had to really focus and even reached to hold therapist's hand.    Perception Perception Perception Tested?: No   Praxis Praxis Praxis tested?: Not tested    Pertinent  Vitals/Pain Pain Assessment: 0-10 Pain Score: 6  Pain Descriptors / Indicators: Discomfort Pain Intervention(s): Repositioned;Premedicated before session     Hand Dominance Right   Extremity/Trunk Assessment Upper Extremity Assessment Upper Extremity Assessment: RUE deficits/detail RUE Deficits / Details: Pt with limited AROM shoulder flexion 0-110 degrees, pt states dislocating his shoulder approximately a month ago.  All other joints AROM WFLS and strength overall 4/5.  Pt also reports history of CVA which affected the RUE as well. RUE Sensation: decreased light touch   Lower Extremity Assessment Lower Extremity Assessment: Defer to PT evaluation   Cervical / Trunk Assessment Cervical / Trunk Assessment: Normal   Communication Communication Communication: No difficulties   Cognition Arousal/Alertness: Awake/alert Behavior During Therapy: WFL for tasks assessed/performed;Flat affect Overall Cognitive Status: Within Functional Limits for tasks assessed                                Home Living Family/patient expects to be discharged to:: Private residence Living Arrangements: Alone   Type of Home: Apartment Home Access: Level entry     Home Layout: One level     Bathroom Shower/Tub: Occupational psychologist: Standard Bathroom Accessibility: No   Home Equipment: Clinical cytogeneticist - 4 wheels;Wheelchair - manual          Prior Functioning/Environment Level of Independence: Independent with assistive device(s)        Comments: Pt states he  could walk 100 feet with RW at home.  Pt states he struggles to manage at home.     OT Diagnosis: Generalized weakness;Disturbance of vision;Acute pain   OT Problem List: Decreased strength;Impaired balance (sitting and/or standing);Decreased activity tolerance;Decreased knowledge of use of DME or AE   OT Treatment/Interventions: Self-care/ADL training;DME and/or AE instruction;Therapeutic  exercise;Energy conservation;Therapeutic activities;Balance training;Patient/family education    OT Goals(Current goals can be found in the care plan section) Acute Rehab OT Goals Patient Stated Goal: To get his strength back. OT Goal Formulation: With patient Time For Goal Achievement: 07/24/14 Potential to Achieve Goals: Good  OT Frequency: Min 2X/week              End of Session Equipment Utilized During Treatment: Rolling walker Nurse Communication: Mobility status  Activity Tolerance: Patient limited by fatigue Patient left: in chair;with call bell/phone within reach;with chair alarm set   Time: [id number] OT Time Calculation (min): 38 min Charges:  OT General Charges $OT Visit: 1 Procedure OT Evaluation $Initial OT Evaluation Tier I: 1 Procedure OT Treatments $Self Care/Home Management : 23-37 mins G-Codes:    Iman Orourke OTR/L 07/28/14, 10:50 AM

## 2014-07-10 NOTE — Progress Notes (Signed)
[place] TEAM 1 - Stepdown/ICU TEAM Progress Note  Trevor [name] [id number] DOB: Jul 11, 1946 DOA: 07/05/2014 PCP: [name], MD  Admit HPI / Brief Narrative: 68 y.o. M with Hx HTN, non ischemic cardiomyopathy, as well as ESRD w/ dialysis M/W/F who presented to the ED in respiratory distress. He related that he "wasn't feeling well" and skipped his dialysis. Unfortunatly this caused him to feel progressively worse and he eventually presented with low O2 sats, labored breathing, tachypnea, and chest pain / pressure. Blood pressures were in the 200's- CXR showed pulmonary edema.  He was been treated with bipap and nitro with reasonable results. He required emergent dialysis.  HPI/Subjective: No new complaints today.  Denies cp, n/v, sob, or abdom pain.    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  volume management per HD - well compensated at present - dry weight has been adjusted by Nephrology - no clinical evidence to suggest current volume overload   Dyspnea Now resolved - for HD today   NSTEMI w/ known CAD  Cardiology has evaluated - now s/p cath revealing moderate to severe single-vessel disease involving the early mid circumflex with a roughly 60% stenosis o/w nonobstructive CAD throughout - no further eval indicated  ?Parox Afib  NSR at present - complete outpt w/u as previously planned - no evidence of arrythmia at present   End stage renal disease on HD M/W/F HD per Nephrology  Noncompliance Patient counseled on sequela of continuing to be noncompliant with medication and HD to include death - it now appears his noncompliance is more related to social challenges than irresponsibility - given the information he has explained to me today, I do not feel it is safe to d/c Mr. Gladbach to his home alone - PT/OT  suggest ALF would be most appropriate for long term care - CM/CSW to investigate ?assisted living placement  Severe malnutrition in the context of chronic illness  Appears to be related to inability to prepare his own meals - see discussion above   Code Status: FULL Family Communication: no family present at time of exam Disposition Plan: med/surg - address social issues which are preventing a safe d/c plan - hopeful for d/c to ALF once facility found and w/ insurance approval    Consultants: Dr. Jamal Maes (Nephrology) Dr. Darlin Coco (Cardiology)  Procedures: 6/20 echocardiogram - Left ventricle: moderate LVH. LVEF= 40% Diffuse hypokinesis - (grade 1 diastolic dysfunction) - Left atrium:  mildly dilated  8/7 - cardiac cath  Antibiotics: NA  DVT prophylaxis: SQ heparin   Objective: VITAL SIGNS: Blood pressure 177/71, pulse 85, temperature 98 F (36.7 C), temperature source Oral, resp. rate 18, height 5\' 10"  (1.778 m), weight 58.922 kg (129 lb 14.4 oz), SpO2 96.00%.  Intake/Output Summary (Last 24 hours) at 07/10/14 1402 Last data filed at 07/10/14 0600  Gross per 24 hour  Intake    180 ml  Output      0 ml  Net    180 ml   Exam: General: No acute respiratory distress  Lungs: Clear to auscultation bilaterally without wheezes or crackles Cardiovascular: Regular rate and rhythm without murmur gallop or rub - no JVD Abdomen: Nontender, nondistended, soft, bowel sounds positive, no rebound, no ascites, no appreciable mass Extremities: No significant cyanosis, clubbing, edema bilateral lower extremities  Data Reviewed: Basic Metabolic Panel:  Recent Labs  Lab 07/03/14 1829 07/05/14 1910 07/05/14 1957 07/06/14 0422 07/07/14 0230  NA 141 141 143 141 139  K 3.1* 4.5 4.3 3.4* 3.5*  CL 104 105 110 98 97  CO2 26 16*  --  26 27  GLUCOSE 82 161* 167* 79 94  BUN 7 29* 31* 10 30*  CREATININE 3.12* 7.13* 8.00* 2.74* 6.07*  CALCIUM 8.2* 9.4  --  9.4 8.7  MG  --    --   --   --  2.0   Liver Function Tests:  Recent Labs Lab 07/07/14 0230  AST 13  ALT 10  ALKPHOS 65  BILITOT 0.5  PROT 6.2  ALBUMIN 3.1*   CBC:  Recent Labs Lab 07/03/14 1829 07/05/14 1910 07/05/14 1957 07/06/14 0422 07/07/14 0230 07/08/14 0410  WBC 3.8* 12.6*  --  7.3 4.3 4.1  NEUTROABS 1.7  --   --   --   --   --   HGB 11.2* 14.7 19.0* 13.1 11.2* 11.2*  HCT 33.0* 43.4 56.0* 37.8* 32.9* 33.6*  MCV 87.3 90.0  --  87.3 88.0 87.0  PLT 106* 175  --  142* 131* 120*   Cardiac Enzymes:  Recent Labs Lab 07/05/14 2149 07/06/14 0422 07/06/14 0944  TROPONINI 0.99* 0.98* 0.85*   CBG:  Recent Labs Lab 07/06/14 0544 07/07/14 0034  GLUCAP 88 94    Recent Results (from the past 240 hour(s))  MRSA PCR SCREENING     Status: None   Collection Time    07/06/14  2:02 AM      Result Value Ref Range Status   MRSA by PCR NEGATIVE  NEGATIVE Final   Comment:            The GeneXpert MRSA Assay (FDA     approved for NASAL specimens     only), is one component of a     comprehensive MRSA colonization     surveillance program. It is not     intended to diagnose MRSA     infection nor to guide or     monitor treatment for     MRSA infections.     Studies:  Recent x-ray studies have been reviewed in detail by the Attending Physician  Scheduled Meds:  Scheduled Meds: . amLODipine  10 mg Oral Daily  . antiseptic oral rinse  7 mL Mouth Rinse BID  . aspirin  325 mg Oral Daily  . calcium acetate  667 mg Oral TID WC  . doxercalciferol  2 mcg Intravenous Q M,W,F-HD  . feeding supplement (RESOURCE BREEZE)  1 Container Oral TID BM  . heparin subcutaneous  5,000 Units Subcutaneous 3 times per day  . hydrALAZINE  50 mg Oral BID  . multivitamin  1 tablet Oral QHS  . tamsulosin  0.4 mg Oral QPC breakfast  . terazosin  2 mg Oral QHS    Time spent on care of this patient: 25 mins  [name], MD [facility] For Consults/Admissions - Flow Manager -  [phone] Office  [phone] Pager [phone]  On-Call/Text Page:      [email or web address]      password [facility]  07/10/2014, 2:02 PM   LOS: 5 days

## 2014-07-10 NOTE — Progress Notes (Signed)
  [facility] Progress Note   Subjective: No complaints, working with OT  Filed Vitals:   07/09/14 2100 07/10/14 0500 07/10/14 1006 07/10/14 1038  BP: 154/61 177/71    Pulse: 78 81 85   Temp: 98.5 F (36.9 C) 98 F (36.7 C)    TempSrc:      Resp: 16 18    Height:      Weight:      SpO2: 100% 100% 98% 96%   Exam: Alert, no distress, tired No jvd Chest clear bilat RRR no MRG Abd soft, scaphoid, no HSM, mod tender LUQ area No LE or UE edema L arm AVF patent Neuro is nf, Ox 3  HD: MWF [place] 4h   2/2.25 Bath  62kg   F180   Heparin 6200  L AVF No other meds     Assessment: 1 HTN crisis / pulm edema - resolved, BP stable on 3 BP meds with vol down 2 NSTEMI type 2 - nonobstructive CAD by heart cath 3 NICM EF 40% 4 ESRD on HD 5 Volume ok, will have new dry wt at dc 6 HPTH pth in range, cont phoslo 7 PAF noted on admit 8 Anemia Hb 11, not requiring any esa at this time  Plan- HD today    Kelly Splinter MD  pager [phone]    cell [phone]  07/10/2014, 10:51 AM     Recent Labs Lab 07/05/14 1910 07/05/14 1957 07/06/14 0422 07/07/14 0230  NA 141 143 141 139  K 4.5 4.3 3.4* 3.5*  CL 105 110 98 97  CO2 16*  --  26 27  GLUCOSE 161* 167* 79 94  BUN 29* 31* 10 30*  CREATININE 7.13* 8.00* 2.74* 6.07*  CALCIUM 9.4  --  9.4 8.7    Recent Labs Lab 07/07/14 0230  AST 13  ALT 10  ALKPHOS 65  BILITOT 0.5  PROT 6.2  ALBUMIN 3.1*    Recent Labs Lab 07/03/14 1829  07/06/14 0422 07/07/14 0230 07/08/14 0410  WBC 3.8*  < > 7.3 4.3 4.1  NEUTROABS 1.7  --   --   --   --   HGB 11.2*  < > 13.1 11.2* 11.2*  HCT 33.0*  < > 37.8* 32.9* 33.6*  MCV 87.3  < > 87.3 88.0 87.0  PLT 106*  < > 142* 131* 120*  < > = values in this interval not displayed. Marland Kitchen amLODipine  10 mg Oral Daily  . antiseptic oral rinse  7 mL Mouth Rinse BID  . aspirin  325 mg Oral Daily  . calcium acetate  667 mg Oral TID WC  . doxercalciferol  2 mcg Intravenous Q M,W,F-HD  .  feeding supplement (RESOURCE BREEZE)  1 Container Oral TID BM  . heparin subcutaneous  5,000 Units Subcutaneous 3 times per day  . hydrALAZINE  50 mg Oral BID  . multivitamin  1 tablet Oral QHS  . tamsulosin  0.4 mg Oral QPC breakfast  . terazosin  2 mg Oral QHS     albuterol, morphine injection, oxyCODONE

## 2014-07-10 NOTE — Progress Notes (Signed)
CARE MANAGEMENT NOTE 07/10/2014  Patient:  Trevor [name], Trevor [name]   Account Number:  [id number]  Date Initiated:  07/10/2014  Documentation initiated by:  [name]  Subjective/Objective Assessment:   Pt admitted with NSTEMI, pulm edema- emergent HD     Action/Plan:   PTA pt lived at home alone   Anticipated DC Date:  07/11/2014   Anticipated DC Plan:    In-house referral  Clinical Social Worker         Choice offered to / List presented to:             Status of service:  In process, will continue to follow Medicare Important Message given?  YES (If response is "NO", the following Medicare IM given date fields will be blank) Date Medicare IM given:  07/10/2014 Medicare IM given by:  [name] Date Additional Medicare IM given:   Additional Medicare IM given by:    Discharge Disposition:    Per UR Regulation:  Reviewed for med. necessity/level of care/duration of stay  If discussed at [place] of Stay Meetings, dates discussed:   07/11/2014    Comments:

## 2014-07-11 DIAGNOSIS — I5043 Acute on chronic combined systolic (congestive) and diastolic (congestive) heart failure: Principal | ICD-10-CM

## 2014-07-11 MED ORDER — ALBUTEROL SULFATE (2.5 MG/3ML) 0.083% IN NEBU: 2.5000 mg | INHALATION_SOLUTION | RESPIRATORY_TRACT | Status: DC | PRN

## 2014-07-11 MED ORDER — METOPROLOL TARTRATE 12.5 MG HALF TABLET: 12.5000 mg | ORAL_TABLET | Freq: Two times a day (BID) | ORAL | Status: DC

## 2014-07-11 MED ORDER — OXYCODONE HCL 5 MG PO TABS: 5.0000 mg | ORAL_TABLET | Freq: Four times a day (QID) | ORAL | Status: DC | PRN

## 2014-07-11 MED ORDER — ASPIRIN EC 81 MG PO TBEC: 81.0000 mg | DELAYED_RELEASE_TABLET | Freq: Every day | ORAL | Status: DC

## 2014-07-11 MED ORDER — ASPIRIN 325 MG PO TABS: 325.0000 mg | ORAL_TABLET | Freq: Every day | ORAL | Status: DC

## 2014-07-11 NOTE — Progress Notes (Signed)
Clinical Social Work Department BRIEF PSYCHOSOCIAL ASSESSMENT 07/11/2014  Patient:  ARISTIDIS, ADLER     Account Number:  [id number]     Admit date:  07/05/2014  Clinical Social Worker:  Adair Laundry  Date/Time:  07/11/2014 11:00 AM  Referred by:  Physician  Date Referred:  07/11/2014 Referred for  [name] Placement   Other Referral:   Interview type:  Patient Other interview type:    PSYCHOSOCIAL DATA Living Status:  ALONE Admitted from facility:   Level of care:   Primary support name:  Jahmel Tina [phone] Primary support relationship to patient:  CHILD, ADULT Degree of support available:   Pt has limited support    CURRENT CONCERNS Current Concerns  Post-Acute Placement   Other Concerns:    SOCIAL WORK ASSESSMENT / PLAN CSW aware of recommendations by PT and medical team. Pt lives alone and there is concern for pt returning there with little assistance. CSW visited pt room and spoke with pt about [name]. Pt aware of recommendation and is agreeable to ST rehab but ultimately wants to be at home. CSW discussed possible option of ALF in the future for long term care. Pt informed CSW this is not what he would want but once again confirmed he was okay with going to a facility for short term care. Pt is agreeable to referral being sent to all [facility]. Pt informed CSW he is set up with [facility] and is MWF 11am dialysis patient at [facility].   Assessment/plan status:  Psychosocial Support/Ongoing Assessment of Needs Other assessment/ plan:   Information/referral to community resources:   [name] list to be provided with bed offers.    PATIENT'S/FAMILY'S RESPONSE TO PLAN OF CARE: Pt coopeartive and pleasant. Pt agreeable to ST rehab at [name].       [place], [place]

## 2014-07-11 NOTE — Progress Notes (Signed)
Patient discharged to [name].  Report called to nurse at skilled nursing facility.  Discharged via [facility].  [name]

## 2014-07-11 NOTE — Discharge Instructions (Addendum)
Continue to follow up with hemodialysis  as scheduled

## 2014-07-11 NOTE — Progress Notes (Signed)
Utilization review completed.  

## 2014-07-11 NOTE — Progress Notes (Signed)
[facility] PROGRESS NOTE  Trevor [name] [id number] DOB: September 05, 1946 DOA: 07/05/2014 PCP: Trevor Potts, MD  Assessment/Plan: 68 y.o. M with Hx HTN, non ischemic cardiomyopathy, as well as ESRD w/ dialysis M/W/F who presented to the ED in respiratory distress. He related that he "wasn't feeling well" and skipped his dialysis. Unfortunatly this caused him to feel progressively worse and he eventually presented with low O2 sats, labored breathing, tachypnea, and chest pain / pressure. Blood pressures were in the 200's- CXR showed pulmonary edema. He was been treated with bipap and nitro with reasonable results. He required emergent dialysis.   1. 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 Combined systolic and diastolic CHF with acute exacerbation  -volume management per HD - well compensated at present - dry weight has been adjusted by Nephrology - no clinical evidence to suggest current volume overload  3. Dyspnea Now resolved  With HD  4. NSTEMI w/ known CAD  -Cardiology has evaluated - now s/p cath revealing moderate to severe single-vessel disease involving the early mid circumflex with a roughly 60% stenosis o/w nonobstructive CAD throughout - no further eval indicated  5. ?Parox Afib NSR at present - complete outpt w/u as previously planned - no evidence of arrythmia at present  6. End stage renal disease on HD M/W/F HD per Nephrology  7. Noncompliance  Patient counseled on sequela of continuing to be noncompliant with medication and HD to include death - it now appears his noncompliance is more related to social challenges than irresponsibility - given the information he has explained to me today, I do not feel it is safe to d/c [name] to his home alone - PT/OT suggest ALF would be most appropriate for long term care - CM/CSW to investigate ?assisted  living placement  8. Severe malnutrition in the context of chronic illness  Appears to be related to inability to prepare his own meals - see discussion above   D/c plans: D/w patient; he is agreeable with [name]: SW involved  - address social issues which are preventing a safe d/c plan - hopeful for d/c to ALF once facility found and w/ insurance approval    Consultants:  Trevor [name] (Nephrology)  Trev[name] (Cardiology)  Procedures:  6/20 echocardiogram - Left ventricle: moderate LVH. LVEF= 40% Diffuse hypokinesis - (grade 1 diastolic dysfunction) - Left atrium: mildly dilated  8/7 - cardiac cath  Antibiotics:  NA   Code Status: full Family Communication:  D/w patient (indicate person spoken with, relationship, and if by phone, the number) Disposition Plan: [name]   Consultants:  Nephrology    HPI/Subjective: alert  Objective: Filed Vitals:   07/11/14 0547  BP: 147/63  Pulse: 80  Temp: 98.9 F (37.2 C)  Resp: 18    Intake/Output Summary (Last 24 hours) at 07/11/14 1029 Last data filed at 07/10/14 1813  Gross per 24 hour  Intake      0 ml  Output   1987 ml  Net  -1987 ml   Filed Weights   07/10/14 1406 07/10/14 1813 07/11/14 0547  Weight: 61.1 kg (134 lb 11.2 oz) 59.2 kg (130 lb 8.2 oz) 58.968 kg (130 lb)    Exam:   General:  alert  Cardiovascular: s1,s2 rrr  Respiratory: CTA BL  Abdomen: soft, nt, nd   Musculoskeletal: no LE edema   Data Reviewed: Basic Metabolic Panel:  Recent Labs Lab 07/05/14 1910 07/05/14 1957 07/06/14 0422 07/07/14 0230 07/10/14 1400  NA 141 143 141 139 138  K 4.5 4.3 3.4* 3.5* 4.5  CL 105 110 98 97 99  CO2 16*  --  26 27 22   GLUCOSE 161* 167* 79 94 122*  BUN 29* 31* 10 30* 48*  CREATININE 7.13* 8.00* 2.74* 6.07* 8.76*  CALCIUM 9.4  --  9.4 8.7 9.6  MG  --   --   --  2.0  --   PHOS  --   --   --   --  4.7*   Liver Function Tests:  Recent Labs Lab 07/07/14 0230 07/10/14 1400  AST 13  --    ALT 10  --   ALKPHOS 65  --   BILITOT 0.5  --   PROT 6.2  --   ALBUMIN 3.1* 3.3*   No results found for this basename: LIPASE, AMYLASE,  in the last 168 hours No results found for this basename: AMMONIA,  in the last 168 hours CBC:  Recent Labs Lab 07/05/14 1910 07/05/14 1957 07/06/14 0422 07/07/14 0230 07/08/14 0410 07/10/14 1400  WBC 12.6*  --  7.3 4.3 4.1 3.8*  HGB 14.7 19.0* 13.1 11.2* 11.2* 10.8*  HCT 43.4 56.0* 37.8* 32.9* 33.6* 31.4*  MCV 90.0  --  87.3 88.0 87.0 87.2  PLT 175  --  142* 131* 120* 128*   Cardiac Enzymes:  Recent Labs Lab 07/05/14 2149 07/06/14 0422 07/06/14 0944  TROPONINI 0.99* 0.98* 0.85*   BNP (last 3 results)  Recent Labs  07/05/14 1910  PROBNP 69174.0*   CBG:  Recent Labs Lab 07/06/14 0544 07/07/14 0034  GLUCAP 88 94    Recent Results (from the past 240 hour(s))  MRSA PCR SCREENING     Status: None   Collection Time    07/06/14  2:02 AM      Result Value Ref Range Status   MRSA by PCR NEGATIVE  NEGATIVE Final   Comment:            The GeneXpert MRSA Assay (FDA     approved for NASAL specimens     only), is one component of a     comprehensive MRSA colonization     surveillance program. It is not     intended to diagnose MRSA     infection nor to guide or     monitor treatment for     MRSA infections.     Studies: No results found.  Scheduled Meds: . amLODipine  10 mg Oral Daily  . antiseptic oral rinse  7 mL Mouth Rinse BID  . aspirin  325 mg Oral Daily  . calcium acetate  667 mg Oral TID WC  . doxercalciferol  2 mcg Intravenous Q M,W,F-HD  . feeding supplement (RESOURCE BREEZE)  1 Container Oral TID BM  . heparin subcutaneous  5,000 Units Subcutaneous 3 times per day  . hydrALAZINE  50 mg Oral BID  . multivitamin  1 tablet Oral QHS  . tamsulosin  0.4 mg Oral QPC breakfast  . terazosin  2 mg Oral QHS   Continuous Infusions:   Principal Problem:   Hypertensive emergency Active Problems:   Nonischemic  cardiomyopathy   History of stroke   Chronic combined systolic and diastolic CHF (congestive heart failure)   Non compliance with medical treatment   Coronary artery disease, non-occlusive: Cath 2013 - Moderate D1 & RCA disease   ESRD on hemodialysis  Acute on chronic combined systolic and diastolic CHF (congestive heart failure)   NSTEMI (non-ST elevated myocardial infarction) - Presumed Type II in setting of HTN Emergency    Time spent: >35 minutes     Kinnie Feil  [facility] Pager [phone]. If 7PM-7AM, please contact night-coverage at [email or web address], password [facility] 07/11/2014, 10:29 AM  LOS: 6 days

## 2014-07-11 NOTE — Discharge Summary (Signed)
Physician Discharge Summary  Trevor [name] [id number] DOB: 06-23-1946 DOA: 07/05/2014  PCP: [name], MD  Admit date: 07/05/2014 Discharge date: 07/11/2014  Time spent: >35 minutes  Recommendations for Outpatient Follow-up:  [name] Discharge Diagnoses:  Principal Problem:   Hypertensive emergency Active Problems:   Nonischemic cardiomyopathy   History of stroke   Chronic combined systolic and diastolic CHF (congestive heart failure)   Non compliance with medical treatment   Coronary artery disease, non-occlusive: Cath 2013 - Moderate D1 & RCA disease   ESRD on hemodialysis   Acute on chronic combined systolic and diastolic CHF (congestive heart failure)   NSTEMI (non-ST elevated myocardial infarction) - Presumed Type II in setting of HTN Emergency   Discharge Condition: stable   Diet recommendation: low sodium  Filed Weights   07/10/14 1406 07/10/14 1813 07/11/14 0547  Weight: 61.1 kg (134 lb 11.2 oz) 59.2 kg (130 lb 8.2 oz) 58.968 kg (130 lb)    History of present illness:  68 y.o. M with Hx HTN, non ischemic cardiomyopathy, as well as ESRD w/ dialysis M/W/F who presented to the ED in respiratory distress. He related that he "wasn't feeling well" and skipped his dialysis. Unfortunatly this caused him to feel progressively worse and he eventually presented with low O2 sats, labored breathing, tachypnea, and chest pain / pressure. Blood pressures were in the 200's- CXR showed pulmonary edema. He was been treated with bipap and nitro with reasonable results. He required emergent dialysis.   Hospital Course:  1. 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 Combined systolic and diastolic CHF with acute exacerbation  -improved on HD: - dry weight has been adjusted by Nephrology - no clinical evidence to suggest current volume  overload  3. Dyspnea Now resolved With HD  4. NSTEMI w/ known CAD  -Cardiology has evaluated - now s/p cath revealing moderate to severe single-vessel disease involving the early mid circumflex with a roughly 60% stenosis o/w nonobstructive CAD throughout - no further eval indicated; added BB, ASA,  5. ?Parox Afib NSR at present - complete outpt w/u as previously planned - no evidence of arrythmia at present; cont outpatient follow up per cardiology recommendations  6. End stage renal disease on HD M/W/F HD per Nephrology  7. Noncompliance  Patient counseled on sequela of continuing to be noncompliant with medication and HD to include death - it now appears his noncompliance is more related to social challenges than irresponsibility - given the information he has explained to me today, I do not feel it is safe to d/c [name] to his home alone - PT/OT suggest ALF would be most appropriate for long term care - CM/CSW to investigate ?assisted living placement  8. Severe malnutrition in the context of chronic illness  Appears to be related to inability to prepare his own meals - see discussion above     D/c plans: D/w patient; he is agreeable with [name]: SW involved  - address social issues which are preventing a safe d/c plan - hopeful for d/c to ALF once facility found and w/ insurance approval        Consultants:  Dr. Jamal Maes (Nephrology)  Dr. Darlin Coco (Cardiology)  Procedures:  6/20 echocardiogram - Left ventricle: moderate LVH. LVEF= 40% Diffuse hypokinesis - (grade 1 diastolic dysfunction) - Left atrium: mildly dilated  8/7 - cardiac cath  Antibiotics:  NA      Procedures:  HD (i.e. Studies not automatically included, echos, thoracentesis, etc; not x-rays)  Consultations:    Discharge Exam: Filed Vitals:   07/11/14 0547  BP: 147/63  Pulse: 80  Temp: 98.9 F (37.2 C)  Resp: 18    General: alert Cardiovascular: s1,s2 rrr Respiratory: CTA  BL  Discharge Instructions  Discharge Instructions   Diet - low sodium heart healthy    Complete by:  As directed      Discharge instructions    Complete by:  As directed   Follow up with hemodialysis     Increase activity slowly    Complete by:  As directed             Medication List    STOP taking these medications       HYDROcodone-acetaminophen 5-325 MG per tablet  Commonly known as:  NORCO/VICODIN     NIFEdipine 60 MG 24 hr tablet  Commonly known as:  PROCARDIA XL/ADALAT-CC      TAKE these medications       albuterol (2.5 MG/3ML) 0.083% nebulizer solution  Commonly known as:  PROVENTIL  Take 3 mLs (2.5 mg total) by nebulization every 2 (two) hours as needed for wheezing or shortness of breath.     amLODipine 10 MG tablet  Commonly known as:  NORVASC  Take 10 mg by mouth daily.     aspirin 325 MG tablet  Take 1 tablet (325 mg total) by mouth daily.     calcium acetate 667 MG capsule  Commonly known as:  PHOSLO  Take 667 mg by mouth 3 (three) times daily with meals.     DIALYVITE TABLET Tabs  Take 1 tablet by mouth daily.     feeding supplement (RESOURCE BREEZE) Liqd  Take 1 Container by mouth 3 (three) times daily between meals.     NEPRO Liqd  Take 237 mLs by mouth 2 (two) times daily.     hydrALAZINE 50 MG tablet  Commonly known as:  APRESOLINE  Take 50 mg by mouth 2 (two) times daily.     metoprolol tartrate 12.5 mg Tabs tablet  Commonly known as:  LOPRESSOR  Take 0.5 tablets (12.5 mg total) by mouth 2 (two) times daily.     OVER THE COUNTER MEDICATION  Apply 1 application topically daily as needed (for shoulder pain).     oxyCODONE 5 MG immediate release tablet  Commonly known as:  Oxy IR/ROXICODONE  Take 1 tablet (5 mg total) by mouth every 6 (six) hours as needed for severe pain.     tamsulosin 0.4 MG Caps capsule  Commonly known as:  FLOMAX  Take 0.4 mg by mouth daily after breakfast.     terazosin 2 MG capsule  Commonly known as:   HYTRIN  Take 2 mg by mouth at bedtime.       No Known Allergies     Follow-up Information   Follow up with Ermalinda Barrios, PA-C On 07/31/2014. (10:45am)    Specialty:  Cardiology   Contact information:   [address] [place] [address] [phone]       Follow up with COLADONATO,JOSEPH A, MD In 1 week.   Specialty:  Nephrology   Contact information:   [place] [address] [phone]        The results of significant diagnostics from this hospitalization (including imaging, microbiology, ancillary and laboratory) are listed below for reference.  Significant Diagnostic Studies: Dg Chest Port 1 View  07/05/2014   CLINICAL DATA:  Chest pain and short of breath. Skipped dialysis today  EXAM: PORTABLE CHEST - 1 VIEW  COMPARISON:  05/19/2014  FINDINGS: Symmetric bilateral airspace disease has developed since the prior study, with basilar predominance. No significant pleural effusion. Heart size and vascularity appear normal.  IMPRESSION: Symmetric bibasilar airspace disease. Probable pulmonary edema. Pneumonia not excluded.   Electronically Signed   By: Franchot Gallo M.D.   On: 07/05/2014 19:56    Microbiology: Recent Results (from the past 240 hour(s))  MRSA PCR SCREENING     Status: None   Collection Time    07/06/14  2:02 AM      Result Value Ref Range Status   MRSA by PCR NEGATIVE  NEGATIVE Final   Comment:            The GeneXpert MRSA Assay (FDA     approved for NASAL specimens     only), is one component of a     comprehensive MRSA colonization     surveillance program. It is not     intended to diagnose MRSA     infection nor to guide or     monitor treatment for     MRSA infections.     Labs: Basic Metabolic Panel:  Recent Labs Lab 07/05/14 1910 07/05/14 1957 07/06/14 0422 07/07/14 0230 07/10/14 1400  NA 141 143 141 139 138  K 4.5 4.3 3.4* 3.5* 4.5  CL 105 110 98 97 99  CO2 16*  --  26 27 22   GLUCOSE 161* 167*  79 94 122*  BUN 29* 31* 10 30* 48*  CREATININE 7.13* 8.00* 2.74* 6.07* 8.76*  CALCIUM 9.4  --  9.4 8.7 9.6  MG  --   --   --  2.0  --   PHOS  --   --   --   --  4.7*   Liver Function Tests:  Recent Labs Lab 07/07/14 0230 07/10/14 1400  AST 13  --   ALT 10  --   ALKPHOS 65  --   BILITOT 0.5  --   PROT 6.2  --   ALBUMIN 3.1* 3.3*   No results found for this basename: LIPASE, AMYLASE,  in the last 168 hours No results found for this basename: AMMONIA,  in the last 168 hours CBC:  Recent Labs Lab 07/05/14 1910 07/05/14 1957 07/06/14 0422 07/07/14 0230 07/08/14 0410 07/10/14 1400  WBC 12.6*  --  7.3 4.3 4.1 3.8*  HGB 14.7 19.0* 13.1 11.2* 11.2* 10.8*  HCT 43.4 56.0* 37.8* 32.9* 33.6* 31.4*  MCV 90.0  --  87.3 88.0 87.0 87.2  PLT 175  --  142* 131* 120* 128*   Cardiac Enzymes:  Recent Labs Lab 07/05/14 2149 07/06/14 0422 07/06/14 0944  TROPONINI 0.99* 0.98* 0.85*   BNP: BNP (last 3 results)  Recent Labs  07/05/14 1910  PROBNP 69174.0*   CBG:  Recent Labs Lab 07/06/14 0544 07/07/14 0034  GLUCAP 88 94       Signed:  Adonai Selsor N  [facility] 07/11/2014, 1:05 PM

## 2014-07-11 NOTE — Progress Notes (Signed)
  [facility] Progress Note   Subjective: No complaints, walked with PT yesterday, they were optimistic  Filed Vitals:   07/10/14 1730 07/10/14 1813 07/10/14 2001 07/11/14 0547  BP: 135/66 143/67 128/72 147/63  Pulse: 84 77 84 80  Temp:  97.3 F (36.3 C) 98.6 F (37 C) 98.9 F (37.2 C)  TempSrc:  Oral Oral Oral  Resp: 20 20 20 18   Height:      Weight:  59.2 kg (130 lb 8.2 oz)  58.968 kg (130 lb)  SpO2:  98% 100% 98%   Exam: Alert, no distress No jvd Chest clear bilat RRR no MRG Abd soft, scaphoid, no HSM, mod tender LUQ area No LE or UE edema L arm AVF patent Neuro is nf, Ox 3  HD: MWF [place] 4h   2/2.25 [place]  62kg   F180   Heparin 6200  L AVF No other meds     Assessment: 1 HTN crisis / pulm edema - resolved, BP stable on 3 BP meds with vol down 2 NSTEMI type 2 - nonobstructive CAD by heart cath 3 NICM EF 40% 4 ESRD on HD 5 Volume ok, will have new dry wt at dc 6 HPTH pth in range, cont phoslo 7 PAF noted on admit 8 Anemia Hb 11, not requiring any esa at this time  Plan- HD tomorrow, cont to lower dry wt    Kelly Splinter MD  pager [phone]    cell [phone]  07/11/2014, 10:02 AM     Recent Labs Lab 07/06/14 0422 07/07/14 0230 07/10/14 1400  NA 141 139 138  K 3.4* 3.5* 4.5  CL 98 97 99  CO2 26 27 22   GLUCOSE 79 94 122*  BUN 10 30* 48*  CREATININE 2.74* 6.07* 8.76*  CALCIUM 9.4 8.7 9.6  PHOS  --   --  4.7*    Recent Labs Lab 07/07/14 0230 07/10/14 1400  AST 13  --   ALT 10  --   ALKPHOS 65  --   BILITOT 0.5  --   PROT 6.2  --   ALBUMIN 3.1* 3.3*    Recent Labs Lab 07/07/14 0230 07/08/14 0410 07/10/14 1400  WBC 4.3 4.1 3.8*  HGB 11.2* 11.2* 10.8*  HCT 32.9* 33.6* 31.4*  MCV 88.0 87.0 87.2  PLT 131* 120* 128*   . amLODipine  10 mg Oral Daily  . antiseptic oral rinse  7 mL Mouth Rinse BID  . aspirin  325 mg Oral Daily  . calcium acetate  667 mg Oral TID WC  . doxercalciferol  2 mcg Intravenous Q M,W,F-HD  .  feeding supplement (RESOURCE BREEZE)  1 Container Oral TID BM  . heparin subcutaneous  5,000 Units Subcutaneous 3 times per day  . hydrALAZINE  50 mg Oral BID  . multivitamin  1 tablet Oral QHS  . tamsulosin  0.4 mg Oral QPC breakfast  . terazosin  2 mg Oral QHS     albuterol, morphine injection, oxyCODONE

## 2014-07-11 NOTE — Progress Notes (Addendum)
Clinical Social Work Department CLINICAL SOCIAL WORK PLACEMENT NOTE 07/11/2014  Patient:  Trevor [name], Trevor [name]  Account Number:  [id number] Admit date:  07/05/2014  Clinical Social Worker:  Berton Mount, Latanya Presser  Date/time:  07/11/2014 11:00 AM  Clinical Social Work is seeking post-discharge placement for this patient at the following level of care:   [place]   (*CSW will update this form in Epic as items are completed)   07/11/2014  Patient/family provided with [place] Department of Clinical Social Work's list of facilities offering this level of care within the geographic area requested by the patient (or if unable, by the patient's family).  07/11/2014  Patient/family informed of their freedom to choose among providers that offer the needed level of care, that participate in Medicare, Medicaid or managed care program needed by the patient, have an available bed and are willing to accept the patient.  07/11/2014  Patient/family informed of [facility]' ownership interest in [facility], as well as of the fact that they are under no obligation to receive care at this facility.  PASARR submitted to EDS on Existing PASARR number received on   FL2 transmitted to all facilities in geographic area requested by pt/family on  07/11/2014 FL2 transmitted to all facilities within larger geographic area on   Patient informed that his/her managed care company has contracts with or will negotiate with  certain facilities, including the following:     Patient/family informed of bed offers received:  07/11/2014 Patient chooses bed at [facility] Physician recommends and patient chooses bed at    Patient to be transferred to [facility]  on  07/11/2014 Patient to be transferred to facility by [facility] Patient and family notified of transfer on Pt notified on 07/11/2014 Name of family member notified:  None--pt declined and informed CSW he will notify anyone that  needs notifying.  The following physician request were entered in Epic: Physician Request  Please sign FL2.    Additional CommentsBerton Mount, [place]

## 2014-07-11 NOTE — Progress Notes (Signed)
CSW (Clinical Education officer, museum) prepared pt dc packet and placed with shadow chart. CSW awaiting return phone call from insurance with authorization number. Pt nurse to call [facility] once notified by CSW that pt can dc. Pt aware of dc today. CSW signing off.  [place], [place]

## 2014-07-14 NOTE — ED Provider Notes (Signed)
I saw and evaluated the patient, reviewed the resident's note and I agree with the findings and plan.   EKG Interpretation   Date/Time:  Wednesday July 05 2014 22:41:09 EDT Ventricular Rate:  99 PR Interval:  152 QRS Duration: 94 QT Interval:  374 QTC Calculation: 480 R Axis:   45 Text Interpretation:  Sinus rhythm LVH with secondary repolarization  abnormality Borderline prolonged QT interval ED PHYSICIAN INTERPRETATION  AVAILABLE IN [facility] Confirmed by TEST, Record ([id number]) on 07/07/2014  7:21:43 AM      Pt comes in resp distress. Clinically appears to be CGF exacerbation / flash pulm edema, which was confirmed with Xray. VSS. Bipap started - and hypoxia improved, and pt looking more comfortable. Will admit.  CRITICAL CARE Performed by: Varney Biles   Total critical care time: 40 minutes  Critical care time was exclusive of separately billable procedures and treating other patients.  Critical care was necessary to treat or prevent imminent or life-threatening deterioration.  Critical care was time spent personally by me on the following activities: development of treatment plan with patient and/or surrogate as well as nursing, discussions with consultants, evaluation of patient's response to treatment, examination of patient, obtaining history from patient or surrogate, ordering and performing treatments and interventions, ordering and review of laboratory studies, ordering and review of radiographic studies, pulse oximetry and re-evaluation of patient's condition.   Varney Biles, MD 07/14/14 [id number]

## 2014-07-31 ENCOUNTER — Ambulatory Visit: Payer: Medicare HMO | Admitting: Physician Assistant

## 2014-08-08 ENCOUNTER — Encounter: Payer: Self-pay | Admitting: Cardiology

## 2014-08-08 ENCOUNTER — Other Ambulatory Visit: Payer: Self-pay | Admitting: *Deleted

## 2014-08-08 ENCOUNTER — Ambulatory Visit (INDEPENDENT_AMBULATORY_CARE_PROVIDER_SITE_OTHER): Payer: Commercial Managed Care - HMO | Admitting: Cardiology

## 2014-08-08 VITALS — BP 164/80 | HR 78 | Ht 70.0 in | Wt 136.0 lb

## 2014-08-08 DIAGNOSIS — Z992 Dependence on renal dialysis: Secondary | ICD-10-CM

## 2014-08-08 DIAGNOSIS — I428 Other cardiomyopathies: Secondary | ICD-10-CM

## 2014-08-08 DIAGNOSIS — I2510 Atherosclerotic heart disease of native coronary artery without angina pectoris: Secondary | ICD-10-CM

## 2014-08-08 DIAGNOSIS — N186 End stage renal disease: Secondary | ICD-10-CM

## 2014-08-08 DIAGNOSIS — I10 Essential (primary) hypertension: Secondary | ICD-10-CM

## 2014-08-08 DIAGNOSIS — I5043 Acute on chronic combined systolic (congestive) and diastolic (congestive) heart failure: Secondary | ICD-10-CM

## 2014-08-08 DIAGNOSIS — I161 Hypertensive emergency: Secondary | ICD-10-CM

## 2014-08-08 DIAGNOSIS — I509 Heart failure, unspecified: Secondary | ICD-10-CM

## 2014-08-08 MED ORDER — AMLODIPINE BESYLATE 10 MG PO TABS: 10.0000 mg | ORAL_TABLET | Freq: Every day | ORAL | Status: DC

## 2014-08-08 MED ORDER — HYDRALAZINE HCL 50 MG PO TABS: 50.0000 mg | ORAL_TABLET | Freq: Two times a day (BID) | ORAL | Status: DC

## 2014-08-08 MED ORDER — METOPROLOL TARTRATE 12.5 MG HALF TABLET: 12.5000 mg | ORAL_TABLET | Freq: Two times a day (BID) | ORAL | Status: DC

## 2014-08-08 NOTE — Assessment & Plan Note (Signed)
Admitted 07/06/14 after he skipped a couple days of meds and dialysis

## 2014-08-08 NOTE — Assessment & Plan Note (Signed)
Cath 07/07/14- 60% CFX

## 2014-08-08 NOTE — Progress Notes (Signed)
08/08/2014 Trevor [name]   May 28, 1946  [id number]  Primary Physicia COLADONATO,JOSEPH A, MD Primary Cardiologist: Dr Stanford Breed  HPI:  68 y/o AA male, retired from [employer], followed by Dr Stanford Breed and Dr Arty Baumgartner. He has HTN and ESRD and has had compliance issues in the past. His EF has been depressed chronically- in the 30-40% range. He had moderate CAD in 2013 at cath. He lives alone in his own house. He has no family in the area. He doesn't drive- gets to HD on the [facility] bus.           He was admitted 07/06/14 with HTN urgency, acute on chronic mixed CHF, and an elevated Troponin. He had missed a few days of medications and missed dialysis before he came to the ER. Cath done 8/7//15 revealed moderate CFX disease with an area of 60% stenosis. It was felt his Troponin elevation was a type 2 NSTEMI. He improved with resumption of his mediations and HD. He is in the office today for follow up.            He denies any increased chest pain or SOB since discharge. He says he is being compliant with his medications and dialysis.     Current Outpatient Prescriptions  Medication Sig Dispense Refill  . amLODipine (NORVASC) 10 MG tablet Take 10 mg by mouth daily.      Marland Kitchen aspirin 325 MG tablet Take 1 tablet (325 mg total) by mouth daily.      . B Complex-C-Folic Acid (DIALYVITE TABLET) TABS Take 1 tablet by mouth daily.    0  . calcium acetate (PHOSLO) 667 MG capsule Take 667 mg by mouth 3 (three) times daily with meals.      . feeding supplement, RESOURCE BREEZE, (RESOURCE BREEZE) LIQD Take 1 Container by mouth 3 (three) times daily between meals.    0  . hydrALAZINE (APRESOLINE) 50 MG tablet Take 50 mg by mouth 2 (two) times daily.       . metoprolol tartrate (LOPRESSOR) 12.5 mg TABS tablet Take 0.5 tablets (12.5 mg total) by mouth 2 (two) times daily.      . Nutritional Supplements (NEPRO) LIQD Take 237 mLs by mouth 2 (two) times daily.    0  . OVER THE COUNTER MEDICATION Apply 1 application  topically daily as needed (for shoulder pain).      Marland Kitchen oxyCODONE (OXY IR/ROXICODONE) 5 MG immediate release tablet Take 1 tablet (5 mg total) by mouth every 6 (six) hours as needed for severe pain.  30 tablet  0  . tamsulosin (FLOMAX) 0.4 MG CAPS capsule Take 0.4 mg by mouth daily after breakfast.       . terazosin (HYTRIN) 2 MG capsule Take 2 mg by mouth at bedtime.       No current facility-administered medications for this visit.    No Known Allergies  History   Social History  . Marital Status: Single    Spouse Name: N/A    Number of Children: 2  . Years of Education: N/A   Occupational History  . Not on file.   Social History Main Topics  . Smoking status: Current Every Day Smoker -- 0.50 packs/day for 50 years    Types: Cigarettes  . Smokeless tobacco: Never Used  . Alcohol Use: No     Comment: Occasional  . Drug Use: No  . Sexual Activity: Yes   Other Topics Concern  . Not on file   Social  History Narrative  . No narrative on file     Review of Systems: General: negative for chills, fever, night sweats or weight changes.  Cardiovascular: negative for chest pain, dyspnea on exertion, edema, orthopnea, palpitations, paroxysmal nocturnal dyspnea or shortness of breath Dermatological: negative for rash Respiratory: negative for cough or wheezing Urologic: negative for hematuria Abdominal: negative for nausea, vomiting, diarrhea, bright red blood per rectum, melena, or hematemesis Neurologic: negative for visual changes, syncope, or dizziness All other systems reviewed and are otherwise negative except as noted above.    Blood pressure 164/80, pulse 78, height 5\' 10"  (1.778 m), weight 136 lb (61.689 kg).  General appearance: alert, cooperative, appears older than stated age and no distress Lungs: clear to auscultation bilaterally Heart: regular rate and rhythm    ASSESSMENT AND PLAN:   Acute on chronic combined systolic and diastolic CHF (congestive heart  failure) Admitted 07/06/14 after he skipped a couple days of meds and dialysis  Nonischemic cardiomyopathy EF 40% 6/20/5  Hypertensive emergency On admission 07/06/14  ESRD on hemodialysis .  Coronary artery disease, non-occlusive: Cath 2013 - Moderate D1 & RCA disease Cath 07/07/14- 60% CFX   PLAN  I did not change Mr Wadlow's medications. He had not taken his B/P meds yet today. He can follow up with Dr Stanford Breed in 6 months.   Trevor [name] KPA-C 08/08/2014 3:28 PM

## 2014-08-08 NOTE — Assessment & Plan Note (Signed)
EF 40% 6/20/5

## 2014-08-08 NOTE — Assessment & Plan Note (Signed)
On admission 07/06/14

## 2014-08-08 NOTE — Patient Instructions (Signed)
Your physician recommends that you continue on your current medications as directed. Please refer to the Current Medication list given to you today.   Your physician wants you to follow-up in: with 6 months with DR Stanford Breed  You will receive a reminder letter in the mail two months in advance. If you don't receive a letter, please call our office to schedule the follow-up appointment.

## 2014-09-07 ENCOUNTER — Emergency Department (HOSPITAL_COMMUNITY): Payer: Medicare HMO

## 2014-09-07 ENCOUNTER — Encounter (HOSPITAL_COMMUNITY): Payer: Self-pay | Admitting: Emergency Medicine

## 2014-09-07 ENCOUNTER — Observation Stay (HOSPITAL_COMMUNITY)
Admission: EM | Admit: 2014-09-07 | Discharge: 2014-09-09 | Disposition: A | Discharge status: Home / Self Care | Payer: Medicare HMO | Attending: Internal Medicine | Admitting: Internal Medicine

## 2014-09-07 DIAGNOSIS — R531 Weakness: Secondary | ICD-10-CM | POA: Diagnosis not present

## 2014-09-07 DIAGNOSIS — I120 Hypertensive chronic kidney disease with stage 5 chronic kidney disease or end stage renal disease: Secondary | ICD-10-CM | POA: Diagnosis not present

## 2014-09-07 DIAGNOSIS — M1990 Unspecified osteoarthritis, unspecified site: Secondary | ICD-10-CM | POA: Diagnosis not present

## 2014-09-07 DIAGNOSIS — R0602 Shortness of breath: Secondary | ICD-10-CM | POA: Diagnosis not present

## 2014-09-07 DIAGNOSIS — R51 Headache: Secondary | ICD-10-CM | POA: Diagnosis not present

## 2014-09-07 DIAGNOSIS — I252 Old myocardial infarction: Secondary | ICD-10-CM | POA: Diagnosis not present

## 2014-09-07 DIAGNOSIS — H5441 Blindness, right eye, normal vision left eye: Secondary | ICD-10-CM | POA: Insufficient documentation

## 2014-09-07 DIAGNOSIS — I429 Cardiomyopathy, unspecified: Secondary | ICD-10-CM | POA: Insufficient documentation

## 2014-09-07 DIAGNOSIS — F419 Anxiety disorder, unspecified: Secondary | ICD-10-CM | POA: Diagnosis not present

## 2014-09-07 DIAGNOSIS — E43 Unspecified severe protein-calorie malnutrition: Secondary | ICD-10-CM

## 2014-09-07 DIAGNOSIS — N2581 Secondary hyperparathyroidism of renal origin: Secondary | ICD-10-CM

## 2014-09-07 DIAGNOSIS — N186 End stage renal disease: Secondary | ICD-10-CM | POA: Diagnosis not present

## 2014-09-07 DIAGNOSIS — Z992 Dependence on renal dialysis: Secondary | ICD-10-CM | POA: Diagnosis not present

## 2014-09-07 DIAGNOSIS — E8889 Other specified metabolic disorders: Secondary | ICD-10-CM | POA: Insufficient documentation

## 2014-09-07 DIAGNOSIS — I4891 Unspecified atrial fibrillation: Secondary | ICD-10-CM | POA: Diagnosis not present

## 2014-09-07 DIAGNOSIS — G9340 Encephalopathy, unspecified: Secondary | ICD-10-CM | POA: Diagnosis not present

## 2014-09-07 DIAGNOSIS — R4182 Altered mental status, unspecified: Secondary | ICD-10-CM | POA: Diagnosis present

## 2014-09-07 DIAGNOSIS — D638 Anemia in other chronic diseases classified elsewhere: Secondary | ICD-10-CM | POA: Diagnosis not present

## 2014-09-07 DIAGNOSIS — N189 Chronic kidney disease, unspecified: Secondary | ICD-10-CM

## 2014-09-07 DIAGNOSIS — Z8673 Personal history of transient ischemic attack (TIA), and cerebral infarction without residual deficits: Secondary | ICD-10-CM

## 2014-09-07 DIAGNOSIS — G3189 Other specified degenerative diseases of nervous system: Secondary | ICD-10-CM | POA: Diagnosis not present

## 2014-09-07 DIAGNOSIS — I5042 Chronic combined systolic (congestive) and diastolic (congestive) heart failure: Secondary | ICD-10-CM | POA: Diagnosis not present

## 2014-09-07 DIAGNOSIS — D631 Anemia in chronic kidney disease: Secondary | ICD-10-CM

## 2014-09-07 DIAGNOSIS — Z91199 Patient's noncompliance with other medical treatment and regimen due to unspecified reason: Secondary | ICD-10-CM

## 2014-09-07 DIAGNOSIS — I2510 Atherosclerotic heart disease of native coronary artery without angina pectoris: Secondary | ICD-10-CM | POA: Diagnosis not present

## 2014-09-07 DIAGNOSIS — I5043 Acute on chronic combined systolic (congestive) and diastolic (congestive) heart failure: Secondary | ICD-10-CM | POA: Diagnosis present

## 2014-09-07 DIAGNOSIS — F17200 Nicotine dependence, unspecified, uncomplicated: Secondary | ICD-10-CM

## 2014-09-07 DIAGNOSIS — Z9119 Patient's noncompliance with other medical treatment and regimen: Secondary | ICD-10-CM

## 2014-09-07 LAB — URINE MICROSCOPIC-ADD ON

## 2014-09-07 LAB — COMPREHENSIVE METABOLIC PANEL
ALT: 10 U/L (ref 0–53)
ANION GAP: 15 (ref 5–15)
AST: 12 U/L (ref 0–37)
Albumin: 3.6 g/dL (ref 3.5–5.2)
Alkaline Phosphatase: 75 U/L (ref 39–117)
BUN: 30 mg/dL — AB (ref 6–23)
CALCIUM: 9.4 mg/dL (ref 8.4–10.5)
CO2: 27 mEq/L (ref 19–32)
Chloride: 98 mEq/L (ref 96–112)
Creatinine, Ser: 6.61 mg/dL — ABNORMAL HIGH (ref 0.50–1.35)
GFR calc non Af Amer: 8 mL/min — ABNORMAL LOW (ref >90)
GFR, EST AFRICAN AMERICAN: 9 mL/min — AB (ref >90)
GLUCOSE: 72 mg/dL (ref 70–99)
Potassium: 3.5 mEq/L — ABNORMAL LOW (ref 3.7–5.3)
Sodium: 140 mEq/L (ref 137–147)
Total Bilirubin: 0.4 mg/dL (ref 0.3–1.2)
Total Protein: 7.5 g/dL (ref 6.0–8.3)

## 2014-09-07 LAB — CBC WITH DIFFERENTIAL/PLATELET
Basophils Absolute: 0.1 10*3/uL (ref 0.0–0.1)
Basophils Relative: 1 % (ref 0–1)
EOS ABS: 0.2 10*3/uL (ref 0.0–0.7)
EOS PCT: 4 % (ref 0–5)
HCT: 30 % — ABNORMAL LOW (ref 39.0–52.0)
HEMOGLOBIN: 10.2 g/dL — AB (ref 13.0–17.0)
Lymphocytes Relative: 36 % (ref 12–46)
Lymphs Abs: 1.8 10*3/uL (ref 0.7–4.0)
MCH: 28.8 pg (ref 26.0–34.0)
MCHC: 34 g/dL (ref 30.0–36.0)
MCV: 84.7 fL (ref 78.0–100.0)
MONOS PCT: 11 % (ref 3–12)
Monocytes Absolute: 0.6 10*3/uL (ref 0.1–1.0)
Neutro Abs: 2.4 10*3/uL (ref 1.7–7.7)
Neutrophils Relative %: 48 % (ref 43–77)
Platelets: 178 10*3/uL (ref 150–400)
RBC: 3.54 MIL/uL — AB (ref 4.22–5.81)
RDW: 12.9 % (ref 11.5–15.5)
WBC: 5 10*3/uL (ref 4.0–10.5)

## 2014-09-07 LAB — URINALYSIS, ROUTINE W REFLEX MICROSCOPIC
Bilirubin Urine: NEGATIVE
GLUCOSE, UA: NEGATIVE mg/dL
Hgb urine dipstick: NEGATIVE
KETONES UR: NEGATIVE mg/dL
Leukocytes, UA: NEGATIVE
NITRITE: NEGATIVE
PROTEIN: 100 mg/dL — AB
Specific Gravity, Urine: 1.013 (ref 1.005–1.030)
Urobilinogen, UA: 1 mg/dL (ref 0.0–1.0)
pH: 8.5 — ABNORMAL HIGH (ref 5.0–8.0)

## 2014-09-07 LAB — CK: CK TOTAL: 80 U/L (ref 7–232)

## 2014-09-07 LAB — ACETAMINOPHEN LEVEL: Acetaminophen (Tylenol), Serum: <15 ug/mL (ref 10–30)

## 2014-09-07 LAB — RAPID URINE DRUG SCREEN, HOSP PERFORMED
AMPHETAMINES: NOT DETECTED
Barbiturates: NOT DETECTED
Benzodiazepines: NOT DETECTED
Cocaine: NOT DETECTED
Opiates: NOT DETECTED
Tetrahydrocannabinol: NOT DETECTED

## 2014-09-07 LAB — I-STAT CG4 LACTIC ACID, ED: LACTIC ACID, VENOUS: 1.15 mmol/L (ref 0.5–2.2)

## 2014-09-07 LAB — SALICYLATE LEVEL

## 2014-09-07 LAB — ETHANOL: Alcohol, Ethyl (B): <11 mg/dL (ref 0–11)

## 2014-09-07 LAB — I-STAT TROPONIN, ED: Troponin i, poc: 0.03 ng/mL (ref 0.00–0.08)

## 2014-09-07 LAB — LIPASE, BLOOD: LIPASE: 60 U/L — AB (ref 11–59)

## 2014-09-07 MED ORDER — MORPHINE SULFATE 4 MG/ML IJ SOLN
4.0000 mg | Freq: Once | INTRAMUSCULAR | Status: AC
  Administered 2014-09-07: 4 mg via INTRAVENOUS
  Filled 2014-09-07: qty 1

## 2014-09-07 MED ORDER — ONDANSETRON HCL 4 MG/2ML IJ SOLN
4.0000 mg | Freq: Once | INTRAMUSCULAR | Status: AC
  Administered 2014-09-07: 4 mg via INTRAVENOUS
  Filled 2014-09-07: qty 2
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
## 2014-09-07 NOTE — ED Notes (Signed)
Patient transported to CT 

## 2014-09-07 NOTE — ED Notes (Signed)
Patient transported to MRI 

## 2014-09-07 NOTE — ED Notes (Addendum)
Portable CXR in process
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
## 2014-09-07 NOTE — ED Notes (Signed)
Patient returned from CT

## 2014-09-07 NOTE — ED Notes (Signed)
CK added on. Spoke with [employer].
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
## 2014-09-08 DIAGNOSIS — R0602 Shortness of breath: Secondary | ICD-10-CM

## 2014-09-08 DIAGNOSIS — N2581 Secondary hyperparathyroidism of renal origin: Secondary | ICD-10-CM

## 2014-09-08 DIAGNOSIS — N186 End stage renal disease: Secondary | ICD-10-CM | POA: Diagnosis not present

## 2014-09-08 DIAGNOSIS — I5042 Chronic combined systolic (congestive) and diastolic (congestive) heart failure: Secondary | ICD-10-CM

## 2014-09-08 DIAGNOSIS — D631 Anemia in chronic kidney disease: Secondary | ICD-10-CM

## 2014-09-08 DIAGNOSIS — R531 Weakness: Secondary | ICD-10-CM

## 2014-09-08 DIAGNOSIS — G9340 Encephalopathy, unspecified: Secondary | ICD-10-CM | POA: Diagnosis not present

## 2014-09-08 DIAGNOSIS — I2510 Atherosclerotic heart disease of native coronary artery without angina pectoris: Secondary | ICD-10-CM | POA: Diagnosis not present

## 2014-09-08 DIAGNOSIS — Z992 Dependence on renal dialysis: Secondary | ICD-10-CM

## 2014-09-08 LAB — TROPONIN I
Troponin I: <0.3 ng/mL (ref <0.30)
Troponin I: <0.3 ng/mL (ref <0.30)
Troponin I: <0.3 ng/mL (ref <0.30)

## 2014-09-08 LAB — CBC
HEMATOCRIT: 29 % — AB (ref 39.0–52.0)
HEMOGLOBIN: 9.9 g/dL — AB (ref 13.0–17.0)
MCH: 29.3 pg (ref 26.0–34.0)
MCHC: 34.1 g/dL (ref 30.0–36.0)
MCV: 85.8 fL (ref 78.0–100.0)
Platelets: 154 10*3/uL (ref 150–400)
RBC: 3.38 MIL/uL — ABNORMAL LOW (ref 4.22–5.81)
RDW: 13.1 % (ref 11.5–15.5)
WBC: 4.2 10*3/uL (ref 4.0–10.5)

## 2014-09-08 LAB — BASIC METABOLIC PANEL
Anion gap: 15 (ref 5–15)
BUN: 35 mg/dL — AB (ref 6–23)
CHLORIDE: 100 meq/L (ref 96–112)
CO2: 28 meq/L (ref 19–32)
Calcium: 9.1 mg/dL (ref 8.4–10.5)
Creatinine, Ser: 7.88 mg/dL — ABNORMAL HIGH (ref 0.50–1.35)
GFR calc Af Amer: 7 mL/min — ABNORMAL LOW (ref >90)
GFR calc non Af Amer: 6 mL/min — ABNORMAL LOW (ref >90)
GLUCOSE: 74 mg/dL (ref 70–99)
Potassium: 4.4 mEq/L (ref 3.7–5.3)
Sodium: 143 mEq/L (ref 137–147)

## 2014-09-08 LAB — TSH: TSH: 2.39 u[IU]/mL (ref 0.350–4.500)

## 2014-09-08 LAB — MRSA PCR SCREENING: MRSA by PCR: NEGATIVE

## 2014-09-08 MED ORDER — TAMSULOSIN HCL 0.4 MG PO CAPS
0.4000 mg | ORAL_CAPSULE | Freq: Every day | ORAL | Status: DC
  Administered 2014-09-09 (×2): 0.4 mg via ORAL
  Filled 2014-09-08 (×3): qty 1

## 2014-09-08 MED ORDER — ASPIRIN 325 MG PO TABS
325.0000 mg | ORAL_TABLET | Freq: Every day | ORAL | Status: DC
  Administered 2014-09-09 (×2): 325 mg via ORAL
  Filled 2014-09-08 (×2): qty 1

## 2014-09-08 MED ORDER — SODIUM CHLORIDE 0.9 % IV SOLN: 250.0000 mL | INTRAVENOUS | Status: DC | PRN

## 2014-09-08 MED ORDER — NEPRO PO LIQD: 237.0000 mL | Freq: Two times a day (BID) | ORAL | Status: DC

## 2014-09-08 MED ORDER — POTASSIUM CHLORIDE 20 MEQ/15ML (10%) PO LIQD
20.0000 meq | Freq: Once | ORAL | Status: AC
  Administered 2014-09-08: 20 meq via ORAL
  Filled 2014-09-08: qty 15

## 2014-09-08 MED ORDER — OXYCODONE HCL 5 MG PO TABS: 5.0000 mg | ORAL_TABLET | Freq: Four times a day (QID) | ORAL | Status: DC | PRN

## 2014-09-08 MED ORDER — METOPROLOL TARTRATE 12.5 MG HALF TABLET
12.5000 mg | ORAL_TABLET | Freq: Two times a day (BID) | ORAL | Status: DC
  Administered 2014-09-09 (×2): 12.5 mg via ORAL
  Filled 2014-09-08 (×5): qty 1

## 2014-09-08 MED ORDER — NEPRO/CARBSTEADY PO LIQD
237.0000 mL | Freq: Two times a day (BID) | ORAL | Status: DC
  Administered 2014-09-08 – 2014-09-09 (×3): 237 mL via ORAL

## 2014-09-08 MED ORDER — AMLODIPINE BESYLATE 10 MG PO TABS
10.0000 mg | ORAL_TABLET | Freq: Every day | ORAL | Status: DC
  Administered 2014-09-09: 10 mg via ORAL
  Filled 2014-09-08 (×2): qty 1

## 2014-09-08 MED ORDER — TERAZOSIN HCL 2 MG PO CAPS
2.0000 mg | ORAL_CAPSULE | Freq: Every day | ORAL | Status: DC
  Administered 2014-09-09: 2 mg via ORAL
  Filled 2014-09-08 (×3): qty 1

## 2014-09-08 MED ORDER — SODIUM CHLORIDE 0.9 % IJ SOLN
3.0000 mL | Freq: Two times a day (BID) | INTRAMUSCULAR | Status: DC
  Administered 2014-09-09: 3 mL via INTRAVENOUS

## 2014-09-08 MED ORDER — CALCIUM ACETATE 667 MG PO CAPS
667.0000 mg | ORAL_CAPSULE | Freq: Three times a day (TID) | ORAL | Status: DC
  Administered 2014-09-08 – 2014-09-09 (×3): 667 mg via ORAL
  Filled 2014-09-08 (×7): qty 1

## 2014-09-08 MED ORDER — SODIUM CHLORIDE 0.9 % IJ SOLN
3.0000 mL | Freq: Two times a day (BID) | INTRAMUSCULAR | Status: DC
  Administered 2014-09-08 – 2014-09-09 (×4): 3 mL via INTRAVENOUS

## 2014-09-08 MED ORDER — HYDRALAZINE HCL 50 MG PO TABS
50.0000 mg | ORAL_TABLET | Freq: Two times a day (BID) | ORAL | Status: DC
Start: 2014-09-08 — End: 2014-09-09
  Administered 2014-09-08 – 2014-09-09 (×2): 50 mg via ORAL
  Filled 2014-09-08 (×5): qty 1

## 2014-09-08 MED ORDER — HEPARIN SODIUM (PORCINE) 5000 UNIT/ML IJ SOLN
5000.0000 [IU] | Freq: Three times a day (TID) | INTRAMUSCULAR | Status: DC
  Administered 2014-09-08 – 2014-09-09 (×4): 5000 [IU] via SUBCUTANEOUS
  Filled 2014-09-08 (×7): qty 1

## 2014-09-08 MED ORDER — RENA-VITE PO TABS
1.0000 | ORAL_TABLET | Freq: Every day | ORAL | Status: DC
  Administered 2014-09-09 (×2): 1 via ORAL
  Filled 2014-09-08 (×2): qty 1

## 2014-09-08 MED ORDER — DARBEPOETIN ALFA-POLYSORBATE 25 MCG/0.42ML IJ SOLN: 25.0000 ug | INTRAMUSCULAR | Status: DC

## 2014-09-08 MED ORDER — SODIUM CHLORIDE 0.9 % IJ SOLN: 3.0000 mL | INTRAMUSCULAR | Status: DC | PRN
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
## 2014-09-08 NOTE — Evaluation (Signed)
Clinical/Bedside Swallow Evaluation Patient Details  Name: Trevor [name] MRN: [id number] Date of Birth: [birth year]  Today's Date: 09/08/2014 Time: 1033-1046 SLP Time Calculation (min): 13 min  Past Medical History:  Past Medical History  Diagnosis Date  . HTN (hypertension)   . Anxiety   . Cardiomyopathy   . Renal insufficiency   . Myocardial infarction   . CVA (cerebral infarction)   . Chest pain   . Shortness of breath   . Stroke   . Headache(784.0)   . Arthritis    Past Surgical History:  Past Surgical History  Procedure Laterality Date  . Tee without cardioversion  10/09/2011    Procedure: TRANSESOPHAGEAL ECHOCARDIOGRAM (TEE);  Surgeon: Lelon Perla, MD;  Location: [facility] ENDOSCOPY;  Service: Cardiovascular;  Laterality: N/A;  . Right hand    . Insertion of dialysis catheter  09/21/2012    Procedure: INSERTION OF DIALYSIS CATHETER;  Surgeon: Angelia Mould, MD;  Location: [facility] OR;  Service: Vascular;  Laterality: N/A;  Right Internal Jugular Placement   HPI:  68 y.o. male with PMH of HTN, anxiety, cardiomyopathy, ESRD-HD, myocardial infarction, CVA  (2012 right side weakness, right facial droopy, and difficulty in speech), Rt eye blindness, hx of cocaine abuse admitted with acute encephalopathy, headache and weakness.  MRI No acute intracranial infarct or other abnormality identified. 2. Remote left ACA and right PCA territory infarcts with additional ischemic changes as above.  CXR No acute cardiopulmonary abnormality seen.    Assessment / Plan / Recommendation Clinical Impression  Swallow initiation, laryngeal elevation within functional limits without s/s aspiration. Oral prep, mastication and transit functional. He denies dysphagia with CVA in 2012.  Agree with regular texture and thin liquids, straws allowed and pills with water.  No further ST needed.    Aspiration Risk  Mild    Diet Recommendation Regular;Thin liquid   Liquid Administration via:  Straw;Cup Medication Administration: Whole meds with liquid Supervision: Patient able to self feed Compensations: Slow rate;Small sips/bites Postural Changes and/or Swallow Maneuvers: Seated upright 90 degrees    Other  Recommendations Oral Care Recommendations: Oral care BID   Follow Up Recommendations  None    Frequency and Duration        Pertinent Vitals/Pain Pain on right foot, RN notified      Swallow Study           Oral/Motor/Sensory Function Overall Oral Motor/Sensory Function: Impaired at baseline Labial ROM: Reduced right Labial Symmetry: Abnormal symmetry right Labial Strength: Reduced Lingual ROM: Within Functional Limits Lingual Symmetry: Within Functional Limits Lingual Strength: Within Functional Limits Mandible: Within Functional Limits   Ice Chips Ice chips: Not tested   Thin Liquid Thin Liquid: Within functional limits Presentation: Cup;Straw    Nectar Thick Nectar Thick Liquid: Not tested   Honey Thick Honey Thick Liquid: Not tested   Puree Puree: Within functional limits   Solid   GO Functional Assessment Tool Used: skilled clinical judgement Functional Limitations: Swallowing Swallow Current Status [id number]): 0 percent impaired, limited or restricted Swallow Goal Status [id number]): 0 percent impaired, limited or restricted Swallow Discharge Status [id number]): 0 percent impaired, limited or restricted  Solid: Within functional limits       Eliott Amparan, Orbie Pyo 09/08/2014,10:54 AM  Orbie Pyo Colvin Caroli.Ed [employer] [phone]

## 2014-09-08 NOTE — Progress Notes (Signed)
[facility] PROGRESS NOTE  Trevor [name] [id number] DOB: [birth year] DOA: 09/07/2014 PCP: [name], MD  Assessment/Plan: 1-AMS/encephalopathy: no signs of infection, BUN is not high enough for concerns of uremia. Other consideration (unable to prove at this time) if he experiencing encephalopathy due to hypertensive emergency (had hx of med non-compliance and came having HA's). Appears to be secondary to vascular dementia. Patient expresses memory loss and difficulty remembering things. -MRI and CT neg for acute intracranial abnormalities. -will check TSH, RPR, B12 and vit D -base on results will benefit of outpatient psychiatry evaluation. (he is developing anxiety and mood changes around this situation of memory loss he is describing). -MMS exam 25 -will ask PT/OT to evaluate  2-HTN: continue home regimen. Previous admission for hypertensive emergency -currently fair controlled -has not been taking his medication for approx 3 days according to EMS reports -meds has been resumed -HD will also help controlling BP  3-ESRD: continue HD  4-secondary hyperparathyroidism: continue phoslo -will follow renal further recommendations  5-anemia of chronic disease: hgb 10.2; stable. -iron and epogen as per renal discretion  6-CAD: non obstructive from previous cath. -continue ASA, B-blocker and nitrate -no CP -troponin neg   7-chronic combined heart failure: last EF 40%; grade one diastolic dysfunction -daily weight, strict I's and O's -continue heart healthy low sodium diet -volume managed by HD  8-hx of CVA: with right residual deficit. -no new focal deficits appreciated. -MRI negative -continue ASA for secondary prevention   9-hx of syncope: not present during this admission. But according to patient happens frequently. Will benefit of event monitoring as an outpatient.  Code Status: Full Family Communication: no family at bedside Disposition Plan: will move  out of stepdown; if remains stable and appropriate after PT evaluation, will d/c home in am   Consultants:  Renal service (to continue HD)  Procedures:  See below for x-ray reports  MRI/CT head: neg for acute intracranial abnormalities   Antibiotics:  None   HPI/Subjective: AAOX3 currently; reports having some difficulty remembering things; no CP, no SOB. Patient reports he is feeling weak.  Objective: Filed Vitals:   09/08/14 0813  BP: 157/56  Pulse: 69  Temp: 98.2 F (36.8 C)  Resp: 20    Intake/Output Summary (Last 24 hours) at 09/08/14 0830 Last data filed at 09/08/14 0143  Gross per 24 hour  Intake      3 ml  Output      0 ml  Net      3 ml   Filed Weights   09/07/14 1702 09/07/14 2343 09/08/14 0300  Weight: 58.968 kg (130 lb) 60.1 kg (132 lb 7.9 oz) 59.8 kg (131 lb 13.4 oz)    Exam:   General:  AAOX3 currently; reports having some difficulty remembering things; no CP, no SOB.  Cardiovascular: 1 and s2, no rubs or gallops  Respiratory: CTA bilaterally  Abdomen: soft, NT, ND, positive BS  Musculoskeletal: no edema, cyanosis or clubbing   Data Reviewed: Basic Metabolic Panel:  Recent Labs Lab 09/07/14 1836  NA 140  K 3.5*  CL 98  CO2 27  GLUCOSE 72  BUN 30*  CREATININE 6.61*  CALCIUM 9.4   Liver Function Tests:  Recent Labs Lab 09/07/14 1836  AST 12  ALT 10  ALKPHOS 75  BILITOT 0.4  PROT 7.5  ALBUMIN 3.6    Recent Labs Lab 09/07/14 1836  LIPASE 60*   CBC:  Recent Labs Lab 09/07/14 1836  WBC 5.0  NEUTROABS 2.4  HGB 10.2*  HCT 30.0*  MCV 84.7  PLT 178   Cardiac Enzymes:  Recent Labs Lab 09/07/14 1836 09/08/14 0338  CKTOTAL 80  --   TROPONINI  --  <0.30   BNP (last 3 results)  Recent Labs  07/05/14 1910  PROBNP [id number]*    Recent Results (from the past 240 hour(s))  MRSA PCR SCREENING     Status: None   Collection Time    09/08/14  1:02 AM      Result Value Ref Range Status   MRSA by PCR NEGATIVE   NEGATIVE Final   Comment:            The GeneXpert MRSA Assay (FDA     approved for NASAL specimens     only), is one component of a     comprehensive MRSA colonization     surveillance program. It is not     intended to diagnose MRSA     infection nor to guide or     monitor treatment for     MRSA infections.     Studies: Ct Head Wo Contrast  09/07/2014   CLINICAL DATA:  Week and headache.Initial encounter.  EXAM: CT HEAD WITHOUT CONTRAST  TECHNIQUE: Contiguous axial images were obtained from the base of the skull through the vertex without intravenous contrast.  COMPARISON:  05/19/2014  FINDINGS: Sinuses/Soft tissues: Clear paranasal sinuses and mastoid air cells.  Intracranial: Age advanced cerebral atrophy. Moderate low density in the periventricular white matter likely related to small vessel disease. Sore more focal hypoattenuation in the left medial frontal lobe is similar, including on image 23. This extends to the left vertex.  Encephalomalacia involves the right occipital lobe and is not significantly changed. Dense vertebral and carotid atherosclerosis.  Remote right caudate head lacunar infarct.  No mass lesion, hemorrhage, hydrocephalus, acute infarct, intra-axial, or extra-axial fluid collection.  IMPRESSION: 1.  No acute intracranial abnormality. 2. Right PCA and left ACA territory remote infarcts. 3. Small vessel ischemic change and cerebral atrophy   Electronically Signed   By: Abigail Miyamoto M.D.   On: 09/07/2014 18:36   Mr Brain Wo Contrast  09/07/2014   CLINICAL DATA:  Initial evaluation for altered mental status, amnesia, possible stroke.  EXAM: MRI HEAD WITHOUT CONTRAST  TECHNIQUE: Multiplanar, multiecho pulse sequences of the brain and surrounding structures were obtained without intravenous contrast.  COMPARISON:  Prior CT performed earlier on the same day as well as prior MRI from 10/08/2011  FINDINGS: Diffuse prominence of the CSF containing spaces is compatible with  generalized cerebral atrophy. Patchy and confluent T2/FLAIR hyperintensity within the periventricular and deep white matter of both cerebral hemispheres is most compatible with moderate chronic small vessel ischemic changes. Small vessel ischemic changes seen within the pons as well. Encephalomalacia within the left ACA territory and right PCA territory again seen, compatible with remote infarcts. Additional scattered remote lacunar infarct present within the deep white matter of the left centrum semi ovale. Additional small cortical infarcts also noted within the posterior left temporal and occipital lobes (series 7, image 11).  No abnormal foci of restricted diffusion to suggest acute intracranial infarct. Gray-white matter differentiation maintained. No intracranial hemorrhage.  No mass lesion or midline shift. Ventricles are normal in size without evidence of hydrocephalus. No extra-axial fluid collection.  Pituitary gland within normal limits. Craniocervical junction widely patent.  No acute abnormality seen about either orbit.  Mild multilevel degenerative changes present within the visualized upper cervical spine. Craniocervical  junction within normal limits.  Bone marrow signal intensity is unremarkable. Scalp soft tissues within normal limits.  Paranasal sinuses and mastoid air cells are clear.  IMPRESSION: 1. No acute intracranial infarct or other abnormality identified. 2. Remote left ACA and right PCA territory infarcts with additional ischemic changes as above. 3. Atrophy with moderate chronic microvascular ischemic disease.   Electronically Signed   By: Jeannine Boga M.D.   On: 09/07/2014 23:41   Dg Chest Port 1 View  09/07/2014   CLINICAL DATA:  Shortness of breath.  EXAM: PORTABLE CHEST - 1 VIEW  COMPARISON:  July 05, 2014.  FINDINGS: The heart size and mediastinal contours are within normal limits. Both lungs are clear. No pneumothorax or pleural effusion is noted. The visualized skeletal  structures are unremarkable.  IMPRESSION: No acute cardiopulmonary abnormality seen.   Electronically Signed   By: Sabino Dick M.D.   On: 09/07/2014 18:13    Scheduled Meds: . amLODipine  10 mg Oral Daily  . aspirin  325 mg Oral Daily  . calcium acetate  667 mg Oral TID WC  . feeding supplement (NEPRO CARB STEADY)  237 mL Oral BID BM  . heparin  5,000 Units Subcutaneous 3 times per day  . hydrALAZINE  50 mg Oral BID  . metoprolol tartrate  12.5 mg Oral BID  . multivitamin  1 tablet Oral Daily  . sodium chloride  3 mL Intravenous Q12H  . sodium chloride  3 mL Intravenous Q12H  . tamsulosin  0.4 mg Oral QPC breakfast   Continuous Infusions:   Principal Problem:   Acute encephalopathy Active Problems:   History of stroke   Chronic combined systolic and diastolic CHF (congestive heart failure)   Coronary artery disease, non-occlusive: Cath 2013 - Moderate D1 & RCA disease   ESRD on hemodialysis   NSTEMI (non-ST elevated myocardial infarction) - Presumed Type II in setting of HTN Emergency   CAD (coronary artery disease)   Generalized weakness    Time spent: < 30 minutes    Barton Dubois  [facility] Pager [phone]. If 7PM-7AM, please contact night-coverage at [email or web address], password [email or web address] 09/08/2014, 8:30 AM  LOS: 1 day

## 2014-09-08 NOTE — Progress Notes (Signed)
Utilization Review Completed.  
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
## 2014-09-09 DIAGNOSIS — Z9119 Patient's noncompliance with other medical treatment and regimen: Secondary | ICD-10-CM

## 2014-09-09 DIAGNOSIS — Z720 Tobacco use: Secondary | ICD-10-CM

## 2014-09-09 LAB — VITAMIN B12: VITAMIN B 12: 376 pg/mL (ref 211–911)

## 2014-09-09 LAB — RPR: RPR Ser Ql: REACTIVE — AB

## 2014-09-09 LAB — HEPATITIS B SURFACE ANTIBODY,QUALITATIVE

## 2014-09-09 LAB — RPR TITER: RPR Titer: 1:1 {titer}

## 2014-09-09 MED ORDER — TERAZOSIN HCL 2 MG PO CAPS: 2.0000 mg | ORAL_CAPSULE | Freq: Every day | ORAL | Status: DC

## 2014-09-09 MED ORDER — HYDRALAZINE HCL 10 MG PO TABS
10.0000 mg | ORAL_TABLET | Freq: Two times a day (BID) | ORAL | Status: DC
  Administered 2014-09-09: 10 mg via ORAL
  Filled 2014-09-09 (×2): qty 1

## 2014-09-09 MED ORDER — HYDRALAZINE HCL 50 MG PO TABS: 50.0000 mg | ORAL_TABLET | Freq: Two times a day (BID) | ORAL | Status: DC

## 2014-09-09 MED ORDER — TAMSULOSIN HCL 0.4 MG PO CAPS: 0.4000 mg | ORAL_CAPSULE | Freq: Every day | ORAL | Status: DC

## 2014-09-09 MED ORDER — AMLODIPINE BESYLATE 5 MG PO TABS: 5.0000 mg | ORAL_TABLET | Freq: Every day | ORAL | Status: DC

## 2014-09-09 MED ORDER — AMLODIPINE BESYLATE 5 MG PO TABS
5.0000 mg | ORAL_TABLET | Freq: Every day | ORAL | Status: DC
  Administered 2014-09-09: 5 mg via ORAL
  Filled 2014-09-09: qty 1

## 2014-09-09 MED ORDER — METOPROLOL TARTRATE 12.5 MG HALF TABLET
12.5000 mg | ORAL_TABLET | Freq: Two times a day (BID) | ORAL | Status: DC
Start: 2014-09-09 — End: 2015-04-05

## 2014-09-09 NOTE — Progress Notes (Signed)
Utilization Review completed.  

## 2014-09-09 NOTE — Progress Notes (Signed)
Subjective:  Feeling stronger / tolerated HD yesterday   Objective Vital signs in last 24 hours: Filed Vitals:   09/08/14 2207 09/08/14 2258 09/09/14 0113 09/09/14 0532  BP: 139/69 130/71 131/56 101/51  Pulse: 85 86 65 66  Temp: 98.6 F (37 C) 98.1 F (36.7 C)  98.7 F (37.1 C)  TempSrc: Oral     Resp: 18 17 17 18   Height:      Weight: 59.2 kg (130 lb 8.2 oz)     SpO2: 99% 100% 99% 100%   Weight change: 0.132 kg (4.7 oz)  Physical Exam: General: Thin BM chronically ill NAD  Heart: Occasional Irreg Pulse /Vrate stable 80 s', no rub,mur, gallop  Lungs: CTA bilat  Abdomen: BS pos. Soft, nontender, Nondistended  Extremities: No pedal edema  Dialysis Access: pos bruit AVF LUAAVF   Dialysis Orders: Center: [facility] on MWF .  EDW 60kg HD Bath 2.0 k, 2.25 ca Time 4 hrs Heparin 6,200. Access LUA AVF  Zemplar 0 mcg IV/HD Aranesp 25 Units IV weekly Venofer 0  Other last op hgb 10.5 08/30/14   Assessment/Plan  1. AMS/ weakness- ? Etiology - primary team evaluating, poss HTN enceph, vs dementia, vs Deconditioning with HO cva  Recent NH placement now back at home alone ? Need for rehab vs NHP  Admit team wu MRI Brain showing  Remote infarcts and mod.chronicmicrovasc. ischermic  diseae 2. ESRD - HD MWF HD  On schedule  3. Hypertension/volume -  BP 101/51, On BP meds, with BP lower taper meds down= amlodipine 10mg   decr to 5mg  , Hydralazine 50 mg decr to 10mg  bid, Hytrin 2mg  hs 4. Anemia - continue ESA hgb 9.9 5. Metabolic bone disease - Binders , no vit d listed 6. HO CVA 7. HO CAD/ CM / parox AFIB   Ernest Haber, PA-C [place] [phone] 09/09/2014,8:27 AM  LOS: 2 days   Pt seen, examined and agree w A/P as above. Looks good today, dressed and ready to go home. No change in renal management.   Kelly Splinter MD pager [phone]    cell [phone] 09/09/2014, 3:37 PM    Labs: Basic Metabolic Panel:  Recent Labs Lab 09/07/14 1836 09/08/14 0808  NA 140  143  K 3.5* 4.4  CL 98 100  CO2 27 28  GLUCOSE 72 74  BUN 30* 35*  CREATININE 6.61* 7.88*  CALCIUM 9.4 9.1   Liver Function Tests:  Recent Labs Lab 09/07/14 1836  AST 12  ALT 10  ALKPHOS 75  BILITOT 0.4  PROT 7.5  ALBUMIN 3.6    Recent Labs Lab 09/07/14 1836  LIPASE 60*  CBC:  Recent Labs Lab 09/07/14 1836 09/08/14 0808  WBC 5.0 4.2  NEUTROABS 2.4  --   HGB 10.2* 9.9*  HCT 30.0* 29.0*  MCV 84.7 85.8  PLT 178 154   Cardiac Enzymes:  Recent Labs Lab 09/07/14 1836 09/08/14 0338 09/08/14 0808 09/08/14 1302  CKTOTAL 80  --   --   --   TROPONINI  --  <0.30 <0.30 <0.30   Studies/Results: Ct Head Wo Contrast  09/07/2014   CLINICAL DATA:  Week and headache.Initial encounter.  EXAM: CT HEAD WITHOUT CONTRAST  TECHNIQUE: Contiguous axial images were obtained from the base of the skull through the vertex without intravenous contrast.  COMPARISON:  05/19/2014  FINDINGS: Sinuses/Soft tissues: Clear paranasal sinuses and mastoid air cells.  Intracranial: Age advanced cerebral atrophy. Moderate low density in the periventricular white matter likely  related to small vessel disease. Sore more focal hypoattenuation in the left medial frontal lobe is similar, including on image 23. This extends to the left vertex.  Encephalomalacia involves the right occipital lobe and is not significantly changed. Dense vertebral and carotid atherosclerosis.  Remote right caudate head lacunar infarct.  No mass lesion, hemorrhage, hydrocephalus, acute infarct, intra-axial, or extra-axial fluid collection.  IMPRESSION: 1.  No acute intracranial abnormality. 2. Right PCA and left ACA territory remote infarcts. 3. Small vessel ischemic change and cerebral atrophy   Electronically Signed   By: Abigail Miyamoto M.D.   On: 09/07/2014 18:36   Mr Brain Wo Contrast  09/07/2014   CLINICAL DATA:  Initial evaluation for altered mental status, amnesia, possible stroke.  EXAM: MRI HEAD WITHOUT CONTRAST  TECHNIQUE:  Multiplanar, multiecho pulse sequences of the brain and surrounding structures were obtained without intravenous contrast.  COMPARISON:  Prior CT performed earlier on the same day as well as prior MRI from 10/08/2011  FINDINGS: Diffuse prominence of the CSF containing spaces is compatible with generalized cerebral atrophy. Patchy and confluent T2/FLAIR hyperintensity within the periventricular and deep white matter of both cerebral hemispheres is most compatible with moderate chronic small vessel ischemic changes. Small vessel ischemic changes seen within the pons as well. Encephalomalacia within the left ACA territory and right PCA territory again seen, compatible with remote infarcts. Additional scattered remote lacunar infarct present within the deep white matter of the left centrum semi ovale. Additional small cortical infarcts also noted within the posterior left temporal and occipital lobes (series 7, image 11).  No abnormal foci of restricted diffusion to suggest acute intracranial infarct. Gray-white matter differentiation maintained. No intracranial hemorrhage.  No mass lesion or midline shift. Ventricles are normal in size without evidence of hydrocephalus. No extra-axial fluid collection.  Pituitary gland within normal limits. Craniocervical junction widely patent.  No acute abnormality seen about either orbit.  Mild multilevel degenerative changes present within the visualized upper cervical spine. Craniocervical junction within normal limits.  Bone marrow signal intensity is unremarkable. Scalp soft tissues within normal limits.  Paranasal sinuses and mastoid air cells are clear.  IMPRESSION: 1. No acute intracranial infarct or other abnormality identified. 2. Remote left ACA and right PCA territory infarcts with additional ischemic changes as above. 3. Atrophy with moderate chronic microvascular ischemic disease.   Electronically Signed   By: Jeannine Boga M.D.   On: 09/07/2014 23:41   Dg  Chest Port 1 View  09/07/2014   CLINICAL DATA:  Shortness of breath.  EXAM: PORTABLE CHEST - 1 VIEW  COMPARISON:  July 05, 2014.  FINDINGS: The heart size and mediastinal contours are within normal limits. Both lungs are clear. No pneumothorax or pleural effusion is noted. The visualized skeletal structures are unremarkable.  IMPRESSION: No acute cardiopulmonary abnormality seen.   Electronically Signed   By: Sabino Dick M.D.   On: 09/07/2014 18:13   Medications:   . amLODipine  10 mg Oral Daily  . aspirin  325 mg Oral Daily  . calcium acetate  667 mg Oral TID WC  . [START ON 09/13/2014] darbepoetin (ARANESP) injection - DIALYSIS  25 mcg Intravenous Q Wed-HD  . feeding supplement (NEPRO CARB STEADY)  237 mL Oral BID BM  . heparin  5,000 Units Subcutaneous 3 times per day  . hydrALAZINE  50 mg Oral BID  . metoprolol tartrate  12.5 mg Oral BID  . multivitamin  1 tablet Oral Daily  . sodium chloride  3 mL Intravenous Q12H  . sodium chloride  3 mL Intravenous Q12H  . tamsulosin  0.4 mg Oral QPC breakfast  . terazosin  2 mg Oral QHS

## 2014-09-09 NOTE — Care Management Note (Signed)
    Page 1 of 1   09/09/2014     1:31:01 PM CARE MANAGEMENT NOTE 09/09/2014  Patient:  MAKHARI, DOVIDIO   Account Number:  [id number]  Date Initiated:  09/09/2014  Documentation initiated by:  [facility]  Subjective/Objective Assessment:   adm:  Acute encephalopathy     Action/Plan:   discharge planning   Anticipated DC Date:  09/09/2014   Anticipated DC Plan:  [place]  CM consult      [facility] Choice  HOME HEALTH   Choice offered to / List presented to:  C-1 Patient        [place] arranged  HH-1 RN  [place].   Status of service:  Completed, signed off Medicare Important Message given?  YES (If response is "NO", the following Medicare IM given date fields will be blank) Date Medicare IM given:  09/09/2014 Medicare IM given by:   Date Additional Medicare IM given:   Additional Medicare IM given by:    Discharge Disposition:  [place]  Per UR Regulation:    If discussed at Long Length of Stay Meetings, dates discussed:    Comments:  09/09/14 13:25 CM met with pt in room to offer choice of hoe health agency.  Pt chooses [facility] to render HHPT/RN.  Pt states he has a rollig walker at home and does not need any other DME.  Address and contact information verified with pt.  Referral called to [facility] rep, Stephanie.  No other CM needs were commun icated.  Mariane Masters, BSN, CM [phone].

## 2014-09-09 NOTE — Discharge Summary (Signed)
Physician Discharge Summary  MACCOY CANIDA [id number] DOB: 1946-07-25 DOA: 09/07/2014  PCP: [name], MD  Admit date: 09/07/2014 Discharge date: 09/09/2014  Time spent: >30 minutes  Recommendations for Outpatient Follow-up:  Reassess BP and adjust regimen as needed Please set up event monitoring and arrange visit with psychiatry  Discharge Diagnoses:  Principal Problem:   Acute encephalopathy Active Problems:   History of stroke   Chronic combined systolic and diastolic CHF (congestive heart failure)   Coronary artery disease, non-occlusive: Cath 2013 - Moderate D1 & RCA disease   ESRD on hemodialysis   NSTEMI (non-ST elevated myocardial infarction) - Presumed Type II in setting of HTN Emergency   CAD (coronary artery disease)   Generalized weakness   Discharge Condition: stable and improved. Will discharge home with [facility] services  Diet recommendation: low sodium diet  Filed Weights   09/08/14 0300 09/08/14 1825 09/08/14 2207  Weight: 59.8 kg (131 lb 13.4 oz) 59.1 kg (130 lb 4.7 oz) 59.2 kg (130 lb 8.2 oz)    History of present illness:  68 y.o. male admitted secondary to whole body weakness, HA, Nausea and mild chest discomfort. denies SOB, no vomiting, he endorses some memory loss. Patient ED work up essentially negative; [facility] called to admit patient for further evaluation.   Hospital Course:  1-AMS/encephalopathy: no signs of infection, BUN is not high enough for concerns of uremia. Other consideration (unable to prove at this time) is if he has been experiencing encephalopathy due to hypertensive emergency (had hx of med non-compliance and came having HA's). Also at high risk for vascular dementia. Patient expresses some memory loss and difficulty remembering things.  -MRI and CT neg for acute intracranial abnormalities.  -TSH and B12 WNL -vit D pending at discharge -RPR was positive but titer demonstrated not active disease -base on results will benefit of  outpatient psychiatry evaluation. (he is developing anxiety and mood changes around this situation of memory loss he is describing).  -MM folstein exam 25  -per PT rec's, will provide a rolling walker and will set up [facility] services -further work up to be done by PCP   2-HTN: continue home regimen. Previous admissions for hypertensive emergency  -currently fair controlled  -has not been taking his medication for approx 3 days according to EMS reports (prior to admission) -meds has been resumed and adjusted -HD will also help controlling BP -patient educated about compliance importance    3-ESRD: continue HD   4-secondary hyperparathyroidism: continue phoslo  -renal to adjust as needed as an outpatient and provide to him further recommendations   5-anemia of chronic disease: hgb 10.2; stable.  -iron and epogen as per renal discretion   6-CAD: non obstructive from previous cath.  -continue ASA, B-blocker and nitrate  -no CP  -troponin neg   7-chronic combined heart failure: last EF 40%; grade one diastolic dysfunction  -daily weight, low sodium diet and fluid restriction  -volume managed by HD   8-hx of CVA: with right residual deficit.  -no new focal deficits appreciated.  -MRI negative  -continue ASA for secondary prevention   9-hx of syncope: none present during this admission. But according to patient happens frequently on HD days and even during off days.. Will benefit of event monitoring as an outpatient to r/o arrythmias.   Procedures:  See below for x-ray reports   Consultations:  Renal service for HD  Discharge Exam: Filed Vitals:   09/09/14 0532  BP: 101/51  Pulse: 66  Temp:  98.7 F (37.1 C)  Resp: 18    General: AAOX3, no fever; reports no SOB and is feeling ok. Feels tired/weak Cardiovascular: S1 and S2, no rubs or gallops; no JVD Respiratory: CTA bilaterally Abd: soft, NT, ND, positive BS Neuro: slight right side weakness (residual deficit from  previous stroke); no further focal deficit appreciated   Discharge Instructions You were cared for by a hospitalist during your hospital stay. If you have any questions about your discharge medications or the care you received while you were in the hospital after you are discharged, you can call the unit and asked to speak with the hospitalist on call if the hospitalist that took care of you is not available. Once you are discharged, your primary care physician will handle any further medical issues. Please note that NO REFILLS for any discharge medications will be authorized once you are discharged, as it is imperative that you return to your primary care physician (or establish a relationship with a primary care physician if you do not have one) for your aftercare needs so that they can reassess your need for medications and monitor your lab values.  Discharge Instructions   Diet - low sodium heart healthy    Complete by:  As directed      Discharge instructions    Complete by:  As directed   Maintain yourself hydrated Follow a low sodium diet Take medications as prescribed Arrange follow up with PCP in 10 days Stop smoking Be compliant with HD          Current Discharge Medication List    CONTINUE these medications which have CHANGED   Details  amLODipine (NORVASC) 5 MG tablet Take 1 tablet (5 mg total) by mouth daily. Qty: 30 tablet, Refills: 3    hydrALAZINE (APRESOLINE) 50 MG tablet Take 1 tablet (50 mg total) by mouth 2 (two) times daily. Qty: 60 tablet, Refills: 3    metoprolol tartrate (LOPRESSOR) 12.5 mg TABS tablet Take 0.5 tablets (12.5 mg total) by mouth 2 (two) times daily. Qty: 60 tablet, Refills: 3    tamsulosin (FLOMAX) 0.4 MG CAPS capsule Take 1 capsule (0.4 mg total) by mouth daily after breakfast. Qty: 30 capsule, Refills: 3    terazosin (HYTRIN) 2 MG capsule Take 1 capsule (2 mg total) by mouth at bedtime. Qty: 30 capsule, Refills: 3      CONTINUE these  medications which have NOT CHANGED   Details  aspirin 325 MG tablet Take 1 tablet (325 mg total) by mouth daily.    B Complex-C-Folic Acid (DIALYVITE TABLET) TABS Take 1 tablet by mouth daily. Refills: 0    calcium acetate (PHOSLO) 667 MG capsule Take 667 mg by mouth 3 (three) times daily with meals.    !! feeding supplement, RESOURCE BREEZE, (RESOURCE BREEZE) LIQD Take 1 Container by mouth 3 (three) times daily between meals. Refills: 0    !! Nutritional Supplements (NEPRO) LIQD Take 237 mLs by mouth 2 (two) times daily. Refills: 0    OVER THE COUNTER MEDICATION Apply 1 application topically daily as needed (for shoulder pain).    oxyCODONE (OXY IR/ROXICODONE) 5 MG immediate release tablet Take 1 tablet (5 mg total) by mouth every 6 (six) hours as needed for severe pain. Qty: 30 tablet, Refills: 0     !! - Potential duplicate medications found. Please discuss with provider.     No Known Allergies Follow-up Information   Follow up with COLADONATO,JOSEPH A, MD. Schedule an appointment as soon  as possible for a visit in 10 days.   Specialty:  Nephrology   Contact information:   [address] [place] [id number] [phone]        The results of significant diagnostics from this hospitalization (including imaging, microbiology, ancillary and laboratory) are listed below for reference.    Significant Diagnostic Studies: Ct Head Wo Contrast  09/07/2014   CLINICAL DATA:  Week and headache.Initial encounter.  EXAM: CT HEAD WITHOUT CONTRAST  TECHNIQUE: Contiguous axial images were obtained from the base of the skull through the vertex without intravenous contrast.  COMPARISON:  05/19/2014  FINDINGS: Sinuses/Soft tissues: Clear paranasal sinuses and mastoid air cells.  Intracranial: Age advanced cerebral atrophy. Moderate low density in the periventricular white matter likely related to small vessel disease. Sore more focal hypoattenuation in the left medial frontal lobe is similar,  including on image 23. This extends to the left vertex.  Encephalomalacia involves the right occipital lobe and is not significantly changed. Dense vertebral and carotid atherosclerosis.  Remote right caudate head lacunar infarct.  No mass lesion, hemorrhage, hydrocephalus, acute infarct, intra-axial, or extra-axial fluid collection.  IMPRESSION: 1.  No acute intracranial abnormality. 2. Right PCA and left ACA territory remote infarcts. 3. Small vessel ischemic change and cerebral atrophy   Electronically Signed   By: Abigail Miyamoto M.D.   On: 09/07/2014 18:36   Mr Brain Wo Contrast  09/07/2014   CLINICAL DATA:  Initial evaluation for altered mental status, amnesia, possible stroke.  EXAM: MRI HEAD WITHOUT CONTRAST  TECHNIQUE: Multiplanar, multiecho pulse sequences of the brain and surrounding structures were obtained without intravenous contrast.  COMPARISON:  Prior CT performed earlier on the same day as well as prior MRI from 10/08/2011  FINDINGS: Diffuse prominence of the CSF containing spaces is compatible with generalized cerebral atrophy. Patchy and confluent T2/FLAIR hyperintensity within the periventricular and deep white matter of both cerebral hemispheres is most compatible with moderate chronic small vessel ischemic changes. Small vessel ischemic changes seen within the pons as well. Encephalomalacia within the left ACA territory and right PCA territory again seen, compatible with remote infarcts. Additional scattered remote lacunar infarct present within the deep white matter of the left centrum semi ovale. Additional small cortical infarcts also noted within the posterior left temporal and occipital lobes (series 7, image 11).  No abnormal foci of restricted diffusion to suggest acute intracranial infarct. Gray-white matter differentiation maintained. No intracranial hemorrhage.  No mass lesion or midline shift. Ventricles are normal in size without evidence of hydrocephalus. No extra-axial fluid  collection.  Pituitary gland within normal limits. Craniocervical junction widely patent.  No acute abnormality seen about either orbit.  Mild multilevel degenerative changes present within the visualized upper cervical spine. Craniocervical junction within normal limits.  Bone marrow signal intensity is unremarkable. Scalp soft tissues within normal limits.  Paranasal sinuses and mastoid air cells are clear.  IMPRESSION: 1. No acute intracranial infarct or other abnormality identified. 2. Remote left ACA and right PCA territory infarcts with additional ischemic changes as above. 3. Atrophy with moderate chronic microvascular ischemic disease.   Electronically Signed   By: Jeannine Boga M.D.   On: 09/07/2014 23:41   Dg Chest Port 1 View  09/07/2014   CLINICAL DATA:  Shortness of breath.  EXAM: PORTABLE CHEST - 1 VIEW  COMPARISON:  July 05, 2014.  FINDINGS: The heart size and mediastinal contours are within normal limits. Both lungs are clear. No pneumothorax or pleural effusion is noted. The  visualized skeletal structures are unremarkable.  IMPRESSION: No acute cardiopulmonary abnormality seen.   Electronically Signed   By: Sabino Dick M.D.   On: 09/07/2014 18:13    Microbiology: Recent Results (from the past 240 hour(s))  MRSA PCR SCREENING     Status: None   Collection Time    09/08/14  1:02 AM      Result Value Ref Range Status   MRSA by PCR NEGATIVE  NEGATIVE Final   Comment:            The GeneXpert MRSA Assay (FDA     approved for NASAL specimens     only), is one component of a     comprehensive MRSA colonization     surveillance program. It is not     intended to diagnose MRSA     infection nor to guide or     monitor treatment for     MRSA infections.     Labs: Basic Metabolic Panel:  Recent Labs Lab 09/07/14 1836 09/08/14 0808  NA 140 143  K 3.5* 4.4  CL 98 100  CO2 27 28  GLUCOSE 72 74  BUN 30* 35*  CREATININE 6.61* 7.88*  CALCIUM 9.4 9.1   Liver Function  Tests:  Recent Labs Lab 09/07/14 1836  AST 12  ALT 10  ALKPHOS 75  BILITOT 0.4  PROT 7.5  ALBUMIN 3.6    Recent Labs Lab 09/07/14 1836  LIPASE 60*   CBC:  Recent Labs Lab 09/07/14 1836 09/08/14 0808  WBC 5.0 4.2  NEUTROABS 2.4  --   HGB 10.2* 9.9*  HCT 30.0* 29.0*  MCV 84.7 85.8  PLT 178 154   Cardiac Enzymes:  Recent Labs Lab 09/07/14 1836 09/08/14 0338 09/08/14 0808 09/08/14 1302  CKTOTAL 80  --   --   --   TROPONINI  --  <0.30 <0.30 <0.30   BNP: BNP (last 3 results)  Recent Labs  07/05/14 1910  PROBNP [id number]*    Signed:  Barton Dubois  [facility] 09/09/2014, 10:58 AM

## 2014-09-11 LAB — FLUORESCENT TREPONEMAL AB(FTA)-IGG-BLD: Fluorescent Treponemal Ab, IgG: REACTIVE — AB

## 2014-09-11 NOTE — ED Provider Notes (Signed)
Medical screening examination/treatment/procedure(s) were conducted as a shared visit with non-physician practitioner(s) and myself.  I personally evaluated the patient during the encounter.  Mental status change after dialysis.  Initially not communicative on my evaluation but then became awake and alert with no memory of coming to ED.  Denies pain.  CT head with chronic infarcts. Questionable fugue versus dementia versus other cause of encephalopathy.   EKG Interpretation   Date/Time:  Thursday September 07 2014 16:59:54 EDT Ventricular Rate:  83 PR Interval:  146 QRS Duration: 93 QT Interval:  386 QTC Calculation: 453 R Axis:   36 Text Interpretation:  Sinus rhythm LVH with secondary repolarization  abnormality No significant change was found Confirmed by Wyvonnia Dusky  MD,  Rahmah Mccamy [id number]) on 09/07/2014 5:54:36 PM       Ezequiel Essex, MD 09/11/14 2050

## 2014-09-12 LAB — VITAMIN D 1,25 DIHYDROXY
VITAMIN D 1, 25 (OH) TOTAL: 16 pg/mL — AB (ref 18–72)
Vitamin D2 1, 25 (OH)2: <8 pg/mL
Vitamin D3 1, 25 (OH)2: 16 pg/mL

## 2014-09-12 NOTE — Progress Notes (Signed)
Physical Therapy Evaluation Addendum to Add G-Codes   October 03, 2014 1151  PT G-Codes **NOT FOR INPATIENT CLASS**  Functional Assessment Tool Used Clinical judgement  Functional Limitation Mobility: Walking and moving around  Mobility: Walking and Moving Around Current Status [phone]) CJ  Mobility: Walking and Moving Around Goal Status [phone]) CJ    Rolinda Roan, PT, DPT Acute Rehabilitation Services Pager: [phone]

## 2014-09-14 LAB — CULTURE, BLOOD (ROUTINE X 2)
CULTURE: NO GROWTH
Culture: NO GROWTH

## 2014-11-09 ENCOUNTER — Encounter (HOSPITAL_COMMUNITY): Payer: Self-pay | Admitting: Cardiovascular Disease

## 2015-01-08 ENCOUNTER — Emergency Department (HOSPITAL_COMMUNITY): Payer: Commercial Managed Care - HMO

## 2015-01-08 ENCOUNTER — Encounter (HOSPITAL_COMMUNITY): Payer: Self-pay | Admitting: Vascular Surgery

## 2015-01-08 ENCOUNTER — Emergency Department (HOSPITAL_COMMUNITY)
Admission: EM | Admit: 2015-01-08 | Discharge: 2015-01-09 | Disposition: A | Discharge status: Home / Self Care | Payer: Commercial Managed Care - HMO | Attending: Emergency Medicine | Admitting: Emergency Medicine

## 2015-01-08 DIAGNOSIS — Z7982 Long term (current) use of aspirin: Secondary | ICD-10-CM | POA: Insufficient documentation

## 2015-01-08 DIAGNOSIS — I252 Old myocardial infarction: Secondary | ICD-10-CM | POA: Insufficient documentation

## 2015-01-08 DIAGNOSIS — Z8659 Personal history of other mental and behavioral disorders: Secondary | ICD-10-CM | POA: Diagnosis not present

## 2015-01-08 DIAGNOSIS — I120 Hypertensive chronic kidney disease with stage 5 chronic kidney disease or end stage renal disease: Secondary | ICD-10-CM | POA: Insufficient documentation

## 2015-01-08 DIAGNOSIS — Z720 Tobacco use: Secondary | ICD-10-CM | POA: Diagnosis not present

## 2015-01-08 DIAGNOSIS — R0602 Shortness of breath: Secondary | ICD-10-CM | POA: Diagnosis present

## 2015-01-08 DIAGNOSIS — E875 Hyperkalemia: Secondary | ICD-10-CM

## 2015-01-08 DIAGNOSIS — Z8673 Personal history of transient ischemic attack (TIA), and cerebral infarction without residual deficits: Secondary | ICD-10-CM | POA: Diagnosis not present

## 2015-01-08 DIAGNOSIS — M1990 Unspecified osteoarthritis, unspecified site: Secondary | ICD-10-CM | POA: Diagnosis not present

## 2015-01-08 DIAGNOSIS — Z9889 Other specified postprocedural states: Secondary | ICD-10-CM | POA: Insufficient documentation

## 2015-01-08 DIAGNOSIS — Z992 Dependence on renal dialysis: Secondary | ICD-10-CM | POA: Insufficient documentation

## 2015-01-08 DIAGNOSIS — N186 End stage renal disease: Secondary | ICD-10-CM | POA: Diagnosis not present

## 2015-01-08 DIAGNOSIS — Z79899 Other long term (current) drug therapy: Secondary | ICD-10-CM | POA: Diagnosis not present

## 2015-01-08 DIAGNOSIS — J810 Acute pulmonary edema: Secondary | ICD-10-CM | POA: Insufficient documentation

## 2015-01-08 HISTORY — DX: Unspecified visual loss: H54.7

## 2015-01-08 LAB — URINALYSIS, ROUTINE W REFLEX MICROSCOPIC
BILIRUBIN URINE: NEGATIVE
Glucose, UA: NEGATIVE mg/dL
KETONES UR: NEGATIVE mg/dL
Leukocytes, UA: NEGATIVE
Nitrite: NEGATIVE
Protein, ur: 100 mg/dL — AB
SPECIFIC GRAVITY, URINE: 1.015 (ref 1.005–1.030)
UROBILINOGEN UA: 0.2 mg/dL (ref 0.0–1.0)
pH: 8.5 — ABNORMAL HIGH (ref 5.0–8.0)

## 2015-01-08 LAB — COMPREHENSIVE METABOLIC PANEL
ALT: 10 U/L (ref 0–53)
AST: 13 U/L (ref 0–37)
Albumin: 3.8 g/dL (ref 3.5–5.2)
Alkaline Phosphatase: 93 U/L (ref 39–117)
Anion gap: 12 (ref 5–15)
BUN: 60 mg/dL — ABNORMAL HIGH (ref 6–23)
CO2: 21 mmol/L (ref 19–32)
Calcium: 9.8 mg/dL (ref 8.4–10.5)
Chloride: 106 mmol/L (ref 96–112)
Creatinine, Ser: 10.26 mg/dL — ABNORMAL HIGH (ref 0.50–1.35)
GFR calc Af Amer: 5 mL/min — ABNORMAL LOW (ref >90)
GFR calc non Af Amer: 4 mL/min — ABNORMAL LOW (ref >90)
Glucose, Bld: 113 mg/dL — ABNORMAL HIGH (ref 70–99)
POTASSIUM: 7.4 mmol/L — AB (ref 3.5–5.1)
SODIUM: 139 mmol/L (ref 135–145)
Total Bilirubin: 1.4 mg/dL — ABNORMAL HIGH (ref 0.3–1.2)
Total Protein: 7.9 g/dL (ref 6.0–8.3)

## 2015-01-08 LAB — I-STAT CHEM 8, ED
BUN: 51 mg/dL — ABNORMAL HIGH (ref 6–23)
Calcium, Ion: 1.15 mmol/L (ref 1.13–1.30)
Chloride: 110 mmol/L (ref 96–112)
Creatinine, Ser: 10.1 mg/dL — ABNORMAL HIGH (ref 0.50–1.35)
GLUCOSE: 108 mg/dL — AB (ref 70–99)
HEMATOCRIT: 41 % (ref 39.0–52.0)
Hemoglobin: 13.9 g/dL (ref 13.0–17.0)
POTASSIUM: 7.3 mmol/L — AB (ref 3.5–5.1)
Sodium: 140 mmol/L (ref 135–145)
TCO2: 18 mmol/L (ref 0–100)

## 2015-01-08 LAB — RAPID URINE DRUG SCREEN, HOSP PERFORMED
AMPHETAMINES: NOT DETECTED
Barbiturates: NOT DETECTED
Benzodiazepines: NOT DETECTED
Cocaine: NOT DETECTED
Opiates: NOT DETECTED
Tetrahydrocannabinol: NOT DETECTED

## 2015-01-08 LAB — BRAIN NATRIURETIC PEPTIDE: B Natriuretic Peptide: >4500 pg/mL — ABNORMAL HIGH (ref 0.0–100.0)

## 2015-01-08 LAB — URINE MICROSCOPIC-ADD ON

## 2015-01-08 LAB — TROPONIN I: TROPONIN I: 0.05 ng/mL — AB (ref <0.031)

## 2015-01-08 LAB — I-STAT CG4 LACTIC ACID, ED: Lactic Acid, Venous: 1.19 mmol/L (ref 0.5–2.0)

## 2015-01-08 MED ORDER — ALBUTEROL SULFATE (2.5 MG/3ML) 0.083% IN NEBU
10.0000 mg | INHALATION_SOLUTION | Freq: Once | RESPIRATORY_TRACT | Status: AC
  Administered 2015-01-08: 10 mg via RESPIRATORY_TRACT
  Filled 2015-01-08: qty 12

## 2015-01-08 MED ORDER — HEPARIN SODIUM (PORCINE) 1000 UNIT/ML DIALYSIS: 100.0000 [IU]/kg | INTRAMUSCULAR | Status: DC | PRN

## 2015-01-08 MED ORDER — LABETALOL HCL 5 MG/ML IV SOLN
20.0000 mg | Freq: Once | INTRAVENOUS | Status: AC
  Administered 2015-01-08: 20 mg via INTRAVENOUS
  Filled 2015-01-08: qty 4

## 2015-01-08 MED ORDER — HEPARIN SODIUM (PORCINE) 1000 UNIT/ML DIALYSIS: 1000.0000 [IU] | INTRAMUSCULAR | Status: DC | PRN

## 2015-01-08 MED ORDER — NEPRO/CARBSTEADY PO LIQD: 237.0000 mL | ORAL | Status: DC | PRN

## 2015-01-08 MED ORDER — SODIUM CHLORIDE 0.9 % IV SOLN: 100.0000 mL | INTRAVENOUS | Status: DC | PRN

## 2015-01-08 MED ORDER — PENTAFLUOROPROP-TETRAFLUOROETH EX AERO: 1.0000 "application " | INHALATION_SPRAY | CUTANEOUS | Status: DC | PRN

## 2015-01-08 MED ORDER — CALCIUM GLUCONATE 10 % IV SOLN
1.0000 g | Freq: Once | INTRAVENOUS | Status: AC
  Administered 2015-01-08: 1 g via INTRAVENOUS
  Filled 2015-01-08: qty 10

## 2015-01-08 MED ORDER — LABETALOL HCL 5 MG/ML IV SOLN: 10.0000 mg | Freq: Once | INTRAVENOUS | Status: DC

## 2015-01-08 MED ORDER — LIDOCAINE-PRILOCAINE 2.5-2.5 % EX CREA: 1.0000 "application " | TOPICAL_CREAM | CUTANEOUS | Status: DC | PRN

## 2015-01-08 MED ORDER — LIDOCAINE HCL (PF) 1 % IJ SOLN: 5.0000 mL | INTRAMUSCULAR | Status: DC | PRN

## 2015-01-08 NOTE — ED Notes (Signed)
Pt reports to the ED for eval of SOB, hacking cough, and HTN. Reports he woke up this morning not feeling well so he missed his dialysis today. Last dialysis was Saturday. Pt hypertensive in the 200s/100s. He was given 2 nitros and his BP now 209/111. Pt was ambulatory on scene. He arrives with NRB in place. His sats were 86% on RA initally. NRB removed and his O2 now 96%. Pt able to speak in full sentences but he does have to pause for breaths. He is also tachypnic. He also reports some weakness and diarrhea today. Denies any blood in his stool. Pt A&Ox4 and skin warm and dry.

## 2015-01-08 NOTE — ED Provider Notes (Signed)
CSN: [id number]     Arrival date & time 01/08/15  1848 History   First MD Initiated Contact with Patient 01/08/15 1855     Chief Complaint  Patient presents with  . 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  Last dialysis 3 days ago.  States he "ate a little of everything" yesterday.    Nephrologist is Dr Vanita Panda.    Past Medical History  Diagnosis Date  . HTN (hypertension)   . Anxiety   . Cardiomyopathy   . Renal insufficiency   . Myocardial infarction   . CVA (cerebral infarction)   . Chest pain   . Shortness of breath   . Stroke   . Headache(784.0)   . Arthritis    Past Surgical History  Procedure Laterality Date  . Tee without cardioversion  10/09/2011    Procedure: TRANSESOPHAGEAL ECHOCARDIOGRAM (TEE);  Surgeon: Lelon Perla, MD;  Location: [facility] ENDOSCOPY;  Service: Cardiovascular;  Laterality: N/A;  . Right hand    . Insertion of dialysis catheter  09/21/2012    Procedure: INSERTION OF DIALYSIS CATHETER;  Surgeon: Angelia Mould, MD;  Location: [place];  Service: Vascular;  Laterality: N/A;  Right Internal Jugular Placement  . Left heart catheterization with coronary angiogram N/A 09/28/2012    Procedure: LEFT HEART CATHETERIZATION WITH CORONARY ANGIOGRAM;  Surgeon: Sherren Mocha, MD;  Location: [facility] CATH LAB;  Service: Cardiovascular;  Laterality: N/A;  . Left heart catheterization with coronary angiogram N/A 07/07/2014    Procedure: LEFT HEART CATHETERIZATION WITH CORONARY ANGIOGRAM;  Surgeon: Leonie Man, MD;  Location: [facility] CATH LAB;  Service: Cardiovascular;  Laterality: N/A;   Family History  Problem Relation Age of Onset  . Heart attack Mother     MI in her 87s  . Anesthesia problems Neg Hx   .  Hypotension Neg Hx   . Malignant hyperthermia Neg Hx   . Pseudochol deficiency Neg Hx    History  Substance Use Topics  . Smoking status: Current Every Day Smoker -- 0.50 packs/day for 50 years    Types: Cigarettes  . Smokeless tobacco: Never Used  . Alcohol Use: No     Comment: Occasional    Review of Systems  All other systems reviewed and are negative.     Allergies  Review of patient's allergies indicates no known allergies.  Home Medications   Prior to Admission medications   Medication Sig Start Date End Date Taking? Authorizing Provider  amLODipine (NORVASC) 5 MG tablet Take 1 tablet (5 mg total) by mouth daily. 09/09/14   Barton Dubois, MD  aspirin 325 MG tablet Take 1 tablet (325 mg total) by mouth daily. 07/11/14   Kinnie Feil, MD  B Complex-C-Folic Acid (DIALYVITE TABLET) TABS Take 1 tablet by mouth daily. 09/28/12   Sorin June Leap, MD  calcium acetate (PHOSLO) 667 MG capsule Take 667 mg by mouth 3 (three) times daily with meals.    Historical Provider, MD  feeding supplement, RESOURCE BREEZE, (RESOURCE BREEZE) LIQD Take 1 Container by mouth 3 (three) times daily between meals. 05/20/14   Barton Dubois, MD  hydrALAZINE (APRESOLINE) 50 MG tablet Take 1 tablet (50 mg total) by mouth 2 (two) times daily. 09/09/14   Barton Dubois, MD  metoprolol tartrate (LOPRESSOR) 12.5 mg TABS tablet Take  0.5 tablets (12.5 mg total) by mouth 2 (two) times daily. 09/09/14   Barton Dubois, MD  Nutritional Supplements (NEPRO) LIQD Take 237 mLs by mouth 2 (two) times daily. 05/20/14   Barton Dubois, MD  OVER THE COUNTER MEDICATION Apply 1 application topically daily as needed (for shoulder pain).    Historical Provider, MD  oxyCODONE (OXY IR/ROXICODONE) 5 MG immediate release tablet Take 1 tablet (5 mg total) by mouth every 6 (six) hours as needed for severe pain. 07/11/14   Kinnie Feil, MD  tamsulosin (FLOMAX) 0.4 MG CAPS capsule Take 1 capsule (0.4 mg total) by mouth daily after  breakfast. 09/09/14   Barton Dubois, MD  terazosin (HYTRIN) 2 MG capsule Take 1 capsule (2 mg total) by mouth at bedtime. 09/09/14   Barton Dubois, MD   There were no vitals taken for this visit. Physical Exam  Constitutional: He appears well-developed and well-nourished. No distress.  HENT:  Head: Normocephalic and atraumatic.  Neck: Neck supple.  Cardiovascular: Normal rate and regular rhythm.   Left upper extremity AV fistula with palpable thrill  Pulmonary/Chest: Accessory muscle usage present. Tachypnea noted. No respiratory distress. He has decreased breath sounds in the right lower field. He has no wheezes.  Abdominal: Soft. He exhibits no distension and no mass. There is no tenderness. There is no rebound and no guarding.  Neurological: He is alert. He exhibits normal muscle tone.  Skin: He is not diaphoretic.  Nursing note and vitals reviewed.   ED Course  Procedures (including critical care time) Labs Review Labs Reviewed  BRAIN NATRIURETIC PEPTIDE - Abnormal; Notable for the following:    B Natriuretic Peptide >4500.0 (*)    All other components within normal limits  TROPONIN I - Abnormal; Notable for the following:    Troponin I 0.05 (*)    All other components within normal limits  COMPREHENSIVE METABOLIC PANEL - Abnormal; Notable for the following:    Potassium 7.4 (*)    Glucose, Bld 113 (*)    BUN 60 (*)    Creatinine, Ser 10.26 (*)    Total Bilirubin 1.4 (*)    GFR calc non Af Amer 4 (*)    GFR calc Af Amer 5 (*)    All other components within normal limits  I-STAT CHEM 8, ED - Abnormal; Notable for the following:    Potassium 7.3 (*)    BUN 51 (*)    Creatinine, Ser 10.10 (*)    Glucose, Bld 108 (*)    All other components within normal limits  URINALYSIS, ROUTINE W REFLEX MICROSCOPIC  URINE RAPID DRUG SCREEN (HOSP PERFORMED)  CBG MONITORING, ED  I-STAT CG4 LACTIC ACID, ED    Imaging Review Dg Chest Port 1 View  01/08/2015   CLINICAL DATA:  Acute  shortness of breath, for 3 days.  EXAM: PORTABLE CHEST - 1 VIEW  COMPARISON:  09/29/2012  FINDINGS: Mild cardiomegaly with increased perihilar and upper lobe interstitial opacities, Mild developing edema is favored. Atypical pneumonia could have a similar appearance. No effusion or pneumothorax. Trachea midline. Atherosclerosis noted of the aorta.  IMPRESSION: Mild cardiomegaly with perihilar and upper lobe asymmetric interstitial opacities, early edema is favored.   Electronically Signed   By: Daryll Brod M.D.   On: 01/08/2015 19:21     EKG Interpretation None       7:08 PM Discussed pt with Dr Ralene Bathe.    Labetolol given per disucssion with Dr Ralene Bathe as patient's BP significantly elevated.  9:00 PM I spoke with Dr Joelyn Oms who will admit the patient for dialysis.    9:47 PM  I spoke with Dr Joelyn Oms who has seen the patient and will take him for dialysis.  He thinks patient will likely be able to be d/c home after dialysis session, will not admit.    Recheck after dialysis.  If symptoms improved and blood pressure improved, may be d/c home to follow up at dialysis Wednesday.    MDM   Final diagnoses:  ESRD on dialysis  Acute pulmonary edema  SOB (shortness of breath)  Hyperkalemia   Afebrile nontoxic patient with ESRD on dialysis p/w SOB and generalized weakness after going off renal diet yesterday for Super Bowl party.  Fluid overloaded, hypertensive, K 7.4, BUN/creat significantly elevated - needs dialysis.  Pt to go to dialysis from ED and to return to ED for reassessment. Dr Joelyn Oms (nephrology) thinks it is very likely patient will be able to be d/c home after dialysis.  K rechecked at dialysis and it is normal, he has become normotensive. Signed out to [employer], PA-C at change of shift.  Pt to be reassessed, if feeling better, to be d/c home with follow up at normal dialysis appointment on Wednesday.      Clayton Bibles, PA-C 01/09/15 0141  Quintella Reichert, MD 01/09/15 [phone]

## 2015-01-08 NOTE — Consult Note (Signed)
Trevor [name] Admit Date: 01/08/2015 01/08/2015 [name] Requesting Physician:  Ralene Bathe MD  Reason for Consult:  Hyperkalemia, SOB, Hypervolemia HPI:  69 year old man ESRD on Monday, Wednesday, Friday at [facility] who presents with shortness of breath and found to have hyperkalemia. The patient did not attend his hemodialysis treatment at his kidney Center this morning because of shortness of breath. Eventually he presented to the emergency room aureus found to have a potassium of 7.4. He is been treated with calcium gluconate and albuterol nebulizer. He states that he had some partying for the Super Bowl last night and is vague on the food that he ate. He denies cocaine use. He has a history of treatment nonadherence.    Outpt HD Orders Unit: [facility] Days: MWF Time: 4h Dialyzer: F180 EDW: 62.5kg K/Ca: 2/2.25 Access: AVF LUE Needle Size: 15g BFR/DFR: 400 / a1.5 UF Proflie: profile 4  VDRA: Hectorol 45mcg qTx EPO: aranesp 25 q2wk IV Fe: 50mg  venofer qWed Heparin: Heparin 6200 units qTx IVB Most Recent Phos / PTH: 5.9 / 616 Most Recent TSAT : 37% Most Recent eKT/V: 1.59 Treatment Adherence: poor, freq s/o and NS  I/Os:  ROS Balance of 12 systems is negative w/ exceptions as above  PMH  Past Medical History  Diagnosis Date  . HTN (hypertension)   . Anxiety   . Cardiomyopathy   . Renal insufficiency   . Myocardial infarction   . CVA (cerebral infarction)   . Chest pain   . Shortness of breath   . Stroke   . Headache(784.0)   . Arthritis   . Blindness    PSH  Past Surgical History  Procedure Laterality Date  . Tee without cardioversion  10/09/2011    Procedure: TRANSESOPHAGEAL ECHOCARDIOGRAM (TEE);  Surgeon: Lelon Perla, MD;  Location: [facility] ENDOSCOPY;  Service: Cardiovascular;  Laterality: N/A;  . Right hand    . Insertion of dialysis catheter  09/21/2012    Procedure: INSERTION OF DIALYSIS CATHETER;  Surgeon: Angelia Mould, MD;  Location:  [place];  Service: Vascular;  Laterality: N/A;  Right Internal Jugular Placement  . Left heart catheterization with coronary angiogram N/A 09/28/2012    Procedure: LEFT HEART CATHETERIZATION WITH CORONARY ANGIOGRAM;  Surgeon: Sherren Mocha, MD;  Location: [facility] CATH LAB;  Service: Cardiovascular;  Laterality: N/A;  . Left heart catheterization with coronary angiogram N/A 07/07/2014    Procedure: LEFT HEART CATHETERIZATION WITH CORONARY ANGIOGRAM;  Surgeon: Leonie Man, MD;  Location: [facility] CATH LAB;  Service: Cardiovascular;  Laterality: N/A;   FH  Family History  Problem Relation Age of Onset  . Heart attack Mother     MI in her 29s  . Anesthesia problems Neg Hx   . Hypotension Neg Hx   . Malignant hyperthermia Neg Hx   . Pseudochol deficiency Neg Hx    SH  reports that he has been smoking Cigarettes.  He has a 25 pack-year smoking history. He has never used smokeless tobacco. He reports that he does not drink alcohol or use illicit drugs. Allergies No Known Allergies Home medications Prior to Admission medications   Medication Sig Start Date End Date Taking? Authorizing Provider  amLODipine (NORVASC) 5 MG tablet Take 1 tablet (5 mg total) by mouth daily. 09/09/14  Yes Barton Dubois, MD  aspirin 325 MG tablet Take 1 tablet (325 mg total) by mouth daily. 07/11/14  Yes Kinnie Feil, MD  B Complex-C-Folic Acid (DIALYVITE TABLET) TABS Take 1 tablet  by mouth daily. 09/28/12  Yes Sorin June Leap, MD  calcium acetate (PHOSLO) 667 MG capsule Take 667 mg by mouth 3 (three) times daily with meals.   Yes Historical Provider, MD  feeding supplement, RESOURCE BREEZE, (RESOURCE BREEZE) LIQD Take 1 Container by mouth 3 (three) times daily between meals. 05/20/14   Barton Dubois, MD  hydrALAZINE (APRESOLINE) 50 MG tablet Take 1 tablet (50 mg total) by mouth 2 (two) times daily. 09/09/14  Yes Barton Dubois, MD  metoprolol tartrate (LOPRESSOR) 12.5 mg TABS tablet Take 0.5 tablets (12.5 mg total) by mouth 2 (two)  times daily. 09/09/14  Yes Barton Dubois, MD  Nutritional Supplements (NEPRO) LIQD Take 237 mLs by mouth 2 (two) times daily. 05/20/14  Yes Barton Dubois, MD  OVER THE COUNTER MEDICATION Apply 1 application topically daily as needed (for shoulder pain).   Yes Historical Provider, MD  oxyCODONE (OXY IR/ROXICODONE) 5 MG immediate release tablet Take 1 tablet (5 mg total) by mouth every 6 (six) hours as needed for severe pain. Patient not taking: Reported on 01/08/2015 07/11/14   Kinnie Feil, MD  tamsulosin (FLOMAX) 0.4 MG CAPS capsule Take 1 capsule (0.4 mg total) by mouth daily after breakfast. Patient not taking: Reported on 01/08/2015 09/09/14   Barton Dubois, MD  terazosin (HYTRIN) 2 MG capsule Take 1 capsule (2 mg total) by mouth at bedtime. 09/09/14  Yes Barton Dubois, MD    Current Medications Scheduled Meds: Continuous Infusions: PRN Meds:.  CBC  Recent Labs Lab 01/08/15 2017  HGB 13.9  HCT [id number]   Basic Metabolic Panel  Recent Labs Lab 01/08/15 1929 01/08/15 2017  NA 139 140  K 7.4* 7.3*  CL 106 110  CO2 21  --   GLUCOSE 113* 108*  BUN 60* 51*  CREATININE 10.26* 10.10*  CALCIUM 9.8  --     Physical Exam  Blood pressure 218/97, pulse 93, temperature 97.9 F (36.6 C), temperature source Oral, resp. rate 24, SpO2 98 %. GEN: Mild inc WOB ENT: NCAT EYES: EOMI CV: RRR, no rub PULM: crackles in bases, inc WOB ABD: s/nt/nd SKIN: no rashes/lesions EXT:No LEE AVF +B/T  Assessment/Plan 54M ESRD with hyperkalemia and vol overload with SOB 1. ESRD 1. Plan for emergent HD tonight for 4L UF and K treatment 2. If improves, possibly DC from ED and next HD at kidney center on Wed 3. Might need admission 4. Use AVF, 3H, 4L UF, 2K bath 5. Tx nonadherence large barrier 2. Hyperkalemia as per #1 3. Volume Overload as per #1; will need chronic attention 4. HTN 1. Runs high as outpt 2. Needs to achieve EDW 3. Might need more BP meds as well 4. See how responds to HD     Pearson Grippe MD [phone] pgr 01/08/2015, 9:29 PM

## 2015-01-08 NOTE — ED Notes (Signed)
The patient is unable to give an urine specimen at this time. The RN is aware. 

## 2015-01-09 LAB — POTASSIUM: POTASSIUM: 4 mmol/L (ref 3.5–5.1)

## 2015-01-09 LAB — CBG MONITORING, ED: Glucose-Capillary: 95 mg/dL (ref 70–99)

## 2015-01-09 LAB — HEPATITIS B SURFACE ANTIGEN: HEP B S AG: NEGATIVE

## 2015-01-09 NOTE — Discharge Instructions (Signed)
Read the information below.  You may return to the Emergency Department at any time for worsening condition or any new symptoms that concern you.  Please follow up with Dr Vanita Panda and go to dialysis as previously scheduled on Wednesday.  If you develop chest pain, worsening shortness of breath, fever, you pass out, or become weak or dizzy, return to the ER for a recheck.       Dialysis Diet A dialysis diet is a special diet when you start peritoneal dialysis or hemodialysis. Foods must be chosen carefully because different foods produce different wastes in your blood. If you are on dialysis treatment, that means your kidneys have stopped working properly on their own. This means the kidneys are removing very little or no wastes from your blood. Dialysis can perform the function of a healthy kidney and filter wastes from your body. However, between dialysis sessions, wastes build up in your blood and can make you sick. You can reduce the amount of wastes that build up in your blood by watching what you eat and drink. A good meal plan can improve your dialysis and your health. Your dietitian or renal dietitian can help you plan meals.  FLUIDS In between dialysis sessions, your kidneys may be able to remove some fluid or none at all. Fluid can build up and cause swelling and weight gain. The extra fluid affects your blood pressure and can make your heart work harder. Overloading your body with fluid could lead to serious heart trouble. Every person on dialysis has a different amount of fluid that they can have each day. Talk to your dietitian about how much fluid you can have each day and write that down.  Foods that add to your fluid intake include:  Foods that contain liquid at room temperature, such as soup, gelatin dessert, and ice cream.  Fruits and vegetables, such as melons, grapes, apples, oranges, tomatoes, lettuce, and celery. Your dietitian will be able to give you other tips for managing  your thirst. POTASSIUM Potassium is a mineral found in many foods, especially milk, fruits, and vegetables. It affects how steadily your heart beats. Potassium levels can rise between dialysis sessions and can affect your heartbeat and may even cause death.  Avoid foods high in potassium. If you do eat high-potassium foods, eat smaller portions. For example, eat half a pear instead of a whole pear. Eat only very small portions of oranges and melons. You can remove some of the potassium from potatoes and other vegetables by peeling them and then soaking them in a large amount of water for several hours. Drain and rinse them before cooking. Talk to a dietitian about foods you can eat instead of high-potassium foods. High-potassium foods and drinks include:  Apricots.  Brussels sprouts.  Dates.  Lima beans.  Oranges.  Prune juice.  Spinach.  Avocados.  Milk.  Figs.  Melons.  Peanuts.  Prunes.  Tomatoes.  Bananas.  Cantaloupe.  Kiwi fruit.  Nectarines.  Asparagus spears.  Raisins.  Winter squash.  Beets.  Clams.  Orange juice.  Potatoes.  Sardines.  Yogurt. PHOSPHORUS Phosphorus is a mineral found in many foods. If you have too much phosphorus in your blood, your bones lose calcium. Losing calcium will make your bones weak and more likely to break. Also, too much phosphorus may make your skin itch. Food high in phosphorus include milk, cheese, dried beans, peas, colas, nuts, and peanut butter. Avoid eating too much of these foods. Usually, people on  dialysis are limited to  cup of milk per day. Talk to a dietitian about foods you can eat instead of high-phosphorus foods. PROTEIN You may be encouraged to eat as much "high-quality protein" as you can. High-quality proteins come from meat, fish, poultry, and eggs. Choose low-fat (lean) meats that are also low in phosphorus. If you are a vegetarian, ask your dietitian about other ways to get your protein.  Low-fat milk is a good source of protein, but milk is high in phosphorus and potassium and adds to your fluid intake. Talk to a dietitian to see if milk fits into your food plan. SODIUM Sodium makes you thirsty, and if you are on dialysis, you must restrict how much fluid you drink. Therefore, try to eat fresh foods that are naturally low in sodium. Stay away from canned foods and frozen dinners. Look for products labeled "low sodium." Do not use salt substitutes because they contain potassium. Talk to your dietitian about foods and spice blends without sodium or potassium.  CALORIES Calories come from food and provide energy for your body. Your dietitian may recommend adding or cutting down on the calories you eat depending on if you need to lose or gain weight. Talk to a dietitian about foods you can eat to either gain or lose weight. VITAMINS AND MINERALS Your caregiver may prescribe a vitamin and mineral supplement. Vitamins and minerals may be missing from your diet because you have to avoid so many foods. Talk to your dietitian or kidney doctor before choosing a supplement. Many vitamin or mineral supplements sold on the store shelf may be harmful to you. Document Released: 08/14/2004 Document Revised: 05/18/2012 Document Reviewed: 01/27/2012 [facility] Patient Information 2015 [place], [place]. This information is not intended to replace advice given to you by your health care provider. Make sure you discuss any questions you have with your health care provider.

## 2015-01-09 NOTE — ED Provider Notes (Signed)
Volume overloaded secondary to diet Went to dialysis Trevor [name] wants a recheck of potassium  Plan: return from dialysis, reassess, d/c home if improved.   3:30: patient returned from dialysis. He reports he feels much better, just "wiped out" as usual post-treatment. No SOB. Potassium rechecked at midnight and was normal. This was not repeated. VSS, slight tachypneic but not SOB, breathing appears unlabored. No bleeding at dialysis shunt site. Stable for discharge home.   Trevor Oats, PA-C 01/09/15 0401  Trevor Reichert, MD 01/11/15 [phone]

## 2015-01-09 NOTE — ED Notes (Signed)
Pt. Left with all belongings 

## 2015-03-05 NOTE — Progress Notes (Signed)
HPI: FU CAD. Carotid dopplers in Nov 2012 showed no significant stenosis bilaterally. Abdominal ultrasound December 2012 showed 2.7 x 2.6 abdominal aortic aneurysm. Last echocardiogram June 2015 showed ejection fraction 40% and moderate left ventricular hypertrophy. Grade 1 diastolic dysfunction. Mild left atrial enlargement. Cardiac catheterization August 2015 showed normal LV function, 60% first diagonal, 50-70% LCx (FFR neg) , 50% RCA. Since last seen he has some fatigue but denies dyspnea. No chest pain, palpitations or syncope.  Current Outpatient Prescriptions  Medication Sig Dispense Refill  . amLODipine (NORVASC) 5 MG tablet Take 1 tablet (5 mg total) by mouth daily. 30 tablet 3  . B Complex-C-Folic Acid (DIALYVITE TABLET) TABS Take 1 tablet by mouth daily.  0  . calcium acetate (PHOSLO) 667 MG capsule Take 667 mg by mouth 3 (three) times daily with meals.    . feeding supplement, RESOURCE BREEZE, (RESOURCE BREEZE) LIQD Take 1 Container by mouth 3 (three) times daily between meals.  0  . hydrALAZINE (APRESOLINE) 50 MG tablet Take 1 tablet (50 mg total) by mouth 2 (two) times daily. 60 tablet 3  . metoprolol tartrate (LOPRESSOR) 12.5 mg TABS tablet Take 0.5 tablets (12.5 mg total) by mouth 2 (two) times daily. 60 tablet 3  . Nutritional Supplements (NEPRO) LIQD Take 237 mLs by mouth 2 (two) times daily.  0  . OVER THE COUNTER MEDICATION Apply 1 application topically daily as needed (for shoulder pain).    Marland Kitchen terazosin (HYTRIN) 2 MG capsule Take 1 capsule (2 mg total) by mouth at bedtime. 30 capsule 3   No current facility-administered medications for this visit.     Past Medical History  Diagnosis Date  . HTN (hypertension)   . Anxiety   . Cardiomyopathy   . Renal insufficiency   . Myocardial infarction   . CVA (cerebral infarction)   . Chest pain   . Shortness of breath   . Stroke   . Headache(784.0)   . Arthritis   . Blindness     Past Surgical History  Procedure  Laterality Date  . Tee without cardioversion  10/09/2011    Procedure: TRANSESOPHAGEAL ECHOCARDIOGRAM (TEE);  Surgeon: Lelon Perla, MD;  Location: [facility] ENDOSCOPY;  Service: Cardiovascular;  Laterality: N/A;  . Right hand    . Insertion of dialysis catheter  09/21/2012    Procedure: INSERTION OF DIALYSIS CATHETER;  Surgeon: Angelia Mould, MD;  Location: [place];  Service: Vascular;  Laterality: N/A;  Right Internal Jugular Placement  . Left heart catheterization with coronary angiogram N/A 09/28/2012    Procedure: LEFT HEART CATHETERIZATION WITH CORONARY ANGIOGRAM;  Surgeon: Sherren Mocha, MD;  Location: [facility] CATH LAB;  Service: Cardiovascular;  Laterality: N/A;  . Left heart catheterization with coronary angiogram N/A 07/07/2014    Procedure: LEFT HEART CATHETERIZATION WITH CORONARY ANGIOGRAM;  Surgeon: Leonie Man, MD;  Location: [facility] CATH LAB;  Service: Cardiovascular;  Laterality: N/A;    History   Social History  . Marital Status: Single    Spouse Name: N/A  . Number of Children: 2  . Years of Education: N/A   Occupational History  . Not on file.   Social History Main Topics  . Smoking status: Current Every Day Smoker -- 0.50 packs/day for 50 years    Types: Cigarettes  . Smokeless tobacco: Never Used  . Alcohol Use: No     Comment: Occasional  . Drug Use: No  . Sexual Activity: Yes   Other Topics Concern  .  Not on file   Social History Narrative    ROS: no fevers or chills, productive cough, hemoptysis, dysphasia, odynophagia, melena, hematochezia, dysuria, hematuria, rash, seizure activity, orthopnea, PND, pedal edema, claudication. Remaining systems are negative.  Physical Exam: Well-developed well-nourished in no acute distress.  Skin is warm and dry.  HEENT is normal.  Neck is supple.  Chest is clear to auscultation with normal expansion.  Cardiovascular exam is regular rate and rhythm.  Abdominal exam nontender or distended. No masses  palpated. Extremities show no edema. neuro grossly intact

## 2015-03-06 ENCOUNTER — Encounter: Payer: Self-pay | Admitting: Cardiology

## 2015-03-06 ENCOUNTER — Ambulatory Visit (INDEPENDENT_AMBULATORY_CARE_PROVIDER_SITE_OTHER): Payer: Commercial Managed Care - HMO | Admitting: Cardiology

## 2015-03-06 VITALS — BP 130/72 | HR 66 | Ht 70.0 in | Wt 140.6 lb

## 2015-03-06 DIAGNOSIS — I5043 Acute on chronic combined systolic (congestive) and diastolic (congestive) heart failure: Secondary | ICD-10-CM | POA: Diagnosis not present

## 2015-03-06 DIAGNOSIS — E785 Hyperlipidemia, unspecified: Secondary | ICD-10-CM

## 2015-03-06 DIAGNOSIS — I714 Abdominal aortic aneurysm, without rupture, unspecified: Secondary | ICD-10-CM

## 2015-03-06 MED ORDER — ASPIRIN EC 81 MG PO TBEC: 81.0000 mg | DELAYED_RELEASE_TABLET | Freq: Every day | ORAL | Status: DC

## 2015-03-06 MED ORDER — PRAVASTATIN SODIUM 40 MG PO TABS: 40.0000 mg | ORAL_TABLET | Freq: Every evening | ORAL | Status: DC

## 2015-03-06 NOTE — Assessment & Plan Note (Signed)
Patient counseled on discontinuing. 

## 2015-03-06 NOTE — Assessment & Plan Note (Signed)
Schedule followup abdominal ultrasound. 

## 2015-03-06 NOTE — Assessment & Plan Note (Signed)
Add enteric-coated aspirin 81 mg daily. Add statin. Begin Pravachol 40 mg daily. Check lipids and liver in 4 weeks.

## 2015-03-06 NOTE — Assessment & Plan Note (Signed)
Volume status managed with dialysis.

## 2015-03-06 NOTE — Patient Instructions (Signed)
Your physician wants you to follow-up in: [place] will receive a reminder letter in the mail two months in advance. If you don't receive a letter, please call our office to schedule the follow-up appointment.   Your physician has requested that you have an abdominal aorta duplex. During this test, an ultrasound is used to evaluate the aorta. Allow 30 minutes for this exam. Do not eat after midnight the day before and avoid carbonated beverages   START ASPIRIN 81 MG ONCE DAILY  START PRAVASTATIN 40 MG ONCE DAILY  Your physician recommends that you RETURN FOR LAB WORK IN 4 WEEKS=DO NOT EAT PRIOR TO LAB WORK

## 2015-03-08 ENCOUNTER — Ambulatory Visit (HOSPITAL_COMMUNITY)
Admission: RE | Admit: 2015-03-08 | Discharge: 2015-03-08 | Disposition: A | Discharge status: Home / Self Care | Payer: Commercial Managed Care - HMO | Source: Ambulatory Visit | Attending: Cardiology | Admitting: Cardiology

## 2015-03-08 DIAGNOSIS — I714 Abdominal aortic aneurysm, without rupture, unspecified: Secondary | ICD-10-CM

## 2015-03-08 NOTE — Progress Notes (Signed)
Abdominal aortic duplex completed. °Brianna L Mazza,RVT °

## 2015-03-16 ENCOUNTER — Encounter: Payer: Self-pay | Admitting: Cardiology

## 2015-03-16 NOTE — Telephone Encounter (Signed)
Pt would like his ultrasound results from 03-08-15 please.

## 2015-03-21 NOTE — Telephone Encounter (Signed)
Left message for pt to call.

## 2015-03-29 NOTE — Telephone Encounter (Signed)
This encounter was created in error - please disregard.

## 2015-03-30 ENCOUNTER — Encounter: Payer: Self-pay | Admitting: *Deleted

## 2015-04-03 ENCOUNTER — Encounter (HOSPITAL_COMMUNITY): Payer: Self-pay | Admitting: Emergency Medicine

## 2015-04-03 ENCOUNTER — Inpatient Hospital Stay (HOSPITAL_COMMUNITY)
Admission: EM | Admit: 2015-04-03 | Discharge: 2015-04-05 | DRG: 069 | Disposition: A | Discharge status: Home Health | Payer: Medicare Other | Attending: Internal Medicine | Admitting: Internal Medicine

## 2015-04-03 ENCOUNTER — Emergency Department (HOSPITAL_COMMUNITY): Payer: Medicare Other

## 2015-04-03 DIAGNOSIS — D631 Anemia in chronic kidney disease: Secondary | ICD-10-CM | POA: Diagnosis present

## 2015-04-03 DIAGNOSIS — F1010 Alcohol abuse, uncomplicated: Secondary | ICD-10-CM | POA: Diagnosis present

## 2015-04-03 DIAGNOSIS — G311 Senile degeneration of brain, not elsewhere classified: Secondary | ICD-10-CM | POA: Diagnosis not present

## 2015-04-03 DIAGNOSIS — I2510 Atherosclerotic heart disease of native coronary artery without angina pectoris: Secondary | ICD-10-CM | POA: Diagnosis not present

## 2015-04-03 DIAGNOSIS — I252 Old myocardial infarction: Secondary | ICD-10-CM

## 2015-04-03 DIAGNOSIS — E785 Hyperlipidemia, unspecified: Secondary | ICD-10-CM | POA: Diagnosis present

## 2015-04-03 DIAGNOSIS — Z9119 Patient's noncompliance with other medical treatment and regimen: Secondary | ICD-10-CM

## 2015-04-03 DIAGNOSIS — Z992 Dependence on renal dialysis: Secondary | ICD-10-CM | POA: Diagnosis not present

## 2015-04-03 DIAGNOSIS — I5043 Acute on chronic combined systolic (congestive) and diastolic (congestive) heart failure: Secondary | ICD-10-CM | POA: Diagnosis present

## 2015-04-03 DIAGNOSIS — I639 Cerebral infarction, unspecified: Secondary | ICD-10-CM | POA: Diagnosis present

## 2015-04-03 DIAGNOSIS — I120 Hypertensive chronic kidney disease with stage 5 chronic kidney disease or end stage renal disease: Secondary | ICD-10-CM | POA: Diagnosis present

## 2015-04-03 DIAGNOSIS — N19 Unspecified kidney failure: Secondary | ICD-10-CM

## 2015-04-03 DIAGNOSIS — R0602 Shortness of breath: Secondary | ICD-10-CM | POA: Diagnosis not present

## 2015-04-03 DIAGNOSIS — I69951 Hemiplegia and hemiparesis following unspecified cerebrovascular disease affecting right dominant side: Secondary | ICD-10-CM

## 2015-04-03 DIAGNOSIS — Z9114 Patient's other noncompliance with medication regimen: Secondary | ICD-10-CM | POA: Diagnosis present

## 2015-04-03 DIAGNOSIS — N189 Chronic kidney disease, unspecified: Secondary | ICD-10-CM | POA: Diagnosis present

## 2015-04-03 DIAGNOSIS — N2581 Secondary hyperparathyroidism of renal origin: Secondary | ICD-10-CM | POA: Diagnosis present

## 2015-04-03 DIAGNOSIS — I429 Cardiomyopathy, unspecified: Secondary | ICD-10-CM | POA: Diagnosis present

## 2015-04-03 DIAGNOSIS — N186 End stage renal disease: Secondary | ICD-10-CM

## 2015-04-03 DIAGNOSIS — Z7982 Long term (current) use of aspirin: Secondary | ICD-10-CM | POA: Diagnosis not present

## 2015-04-03 DIAGNOSIS — H5441 Blindness, right eye, normal vision left eye: Secondary | ICD-10-CM | POA: Diagnosis present

## 2015-04-03 DIAGNOSIS — E559 Vitamin D deficiency, unspecified: Secondary | ICD-10-CM | POA: Diagnosis present

## 2015-04-03 DIAGNOSIS — Z6820 Body mass index (BMI) 20.0-20.9, adult: Secondary | ICD-10-CM | POA: Diagnosis not present

## 2015-04-03 DIAGNOSIS — F17210 Nicotine dependence, cigarettes, uncomplicated: Secondary | ICD-10-CM | POA: Diagnosis present

## 2015-04-03 DIAGNOSIS — Z720 Tobacco use: Secondary | ICD-10-CM

## 2015-04-03 DIAGNOSIS — E43 Unspecified severe protein-calorie malnutrition: Secondary | ICD-10-CM | POA: Diagnosis present

## 2015-04-03 DIAGNOSIS — R531 Weakness: Secondary | ICD-10-CM

## 2015-04-03 DIAGNOSIS — I5042 Chronic combined systolic (congestive) and diastolic (congestive) heart failure: Secondary | ICD-10-CM | POA: Diagnosis not present

## 2015-04-03 DIAGNOSIS — I6529 Occlusion and stenosis of unspecified carotid artery: Secondary | ICD-10-CM | POA: Insufficient documentation

## 2015-04-03 DIAGNOSIS — F419 Anxiety disorder, unspecified: Secondary | ICD-10-CM | POA: Diagnosis present

## 2015-04-03 DIAGNOSIS — G459 Transient cerebral ischemic attack, unspecified: Principal | ICD-10-CM | POA: Diagnosis present

## 2015-04-03 DIAGNOSIS — I679 Cerebrovascular disease, unspecified: Secondary | ICD-10-CM | POA: Diagnosis not present

## 2015-04-03 DIAGNOSIS — Z91199 Patient's noncompliance with other medical treatment and regimen due to unspecified reason: Secondary | ICD-10-CM

## 2015-04-03 LAB — BASIC METABOLIC PANEL
Anion gap: 14 (ref 5–15)
BUN: 30 mg/dL — ABNORMAL HIGH (ref 6–20)
CHLORIDE: 101 mmol/L (ref 101–111)
CO2: 26 mmol/L (ref 22–32)
Calcium: 9.1 mg/dL (ref 8.9–10.3)
Creatinine, Ser: 6.71 mg/dL — ABNORMAL HIGH (ref 0.61–1.24)
GFR calc Af Amer: 9 mL/min — ABNORMAL LOW (ref >60)
GFR calc non Af Amer: 7 mL/min — ABNORMAL LOW (ref >60)
GLUCOSE: 91 mg/dL (ref 70–99)
POTASSIUM: 3.8 mmol/L (ref 3.5–5.1)
SODIUM: 141 mmol/L (ref 135–145)

## 2015-04-03 LAB — CBC
HEMATOCRIT: 30.4 % — AB (ref 39.0–52.0)
Hemoglobin: 10 g/dL — ABNORMAL LOW (ref 13.0–17.0)
MCH: 29.5 pg (ref 26.0–34.0)
MCHC: 32.9 g/dL (ref 30.0–36.0)
MCV: 89.7 fL (ref 78.0–100.0)
PLATELETS: 147 10*3/uL — AB (ref 150–400)
RBC: 3.39 MIL/uL — ABNORMAL LOW (ref 4.22–5.81)
RDW: 13.8 % (ref 11.5–15.5)
WBC: 4.6 10*3/uL (ref 4.0–10.5)

## 2015-04-03 LAB — DIFFERENTIAL
BASOS ABS: 0 10*3/uL (ref 0.0–0.1)
Basophils Relative: 0 % (ref 0–1)
Eosinophils Absolute: 0.3 10*3/uL (ref 0.0–0.7)
Eosinophils Relative: 6 % — ABNORMAL HIGH (ref 0–5)
LYMPHS ABS: 1.2 10*3/uL (ref 0.7–4.0)
Lymphocytes Relative: 25 % (ref 12–46)
Monocytes Absolute: 0.5 10*3/uL (ref 0.1–1.0)
Monocytes Relative: 10 % (ref 3–12)
NEUTROS ABS: 2.7 10*3/uL (ref 1.7–7.7)
Neutrophils Relative %: 59 % (ref 43–77)

## 2015-04-03 LAB — APTT: aPTT: 37 seconds (ref 24–37)

## 2015-04-03 LAB — I-STAT TROPONIN, ED: Troponin i, poc: 0.01 ng/mL (ref 0.00–0.08)

## 2015-04-03 LAB — TROPONIN I: Troponin I: 0.03 ng/mL (ref <0.031)

## 2015-04-03 LAB — PROTIME-INR
INR: 1.05 (ref 0.00–1.49)
PROTHROMBIN TIME: 13.8 s (ref 11.6–15.2)

## 2015-04-03 MED ORDER — HYDRALAZINE HCL 20 MG/ML IJ SOLN
5.0000 mg | INTRAMUSCULAR | Status: DC | PRN
  Administered 2015-04-03: 5 mg via INTRAVENOUS
  Filled 2015-04-03: qty 1

## 2015-04-03 NOTE — ED Provider Notes (Addendum)
CSN: [id number]     Arrival date & time 04/03/15  1705 History   First MD Initiated Contact with Patient 04/03/15 1708     Chief complaint: Shortness of breath and numbness. The history is provided by the patient.   patient states he's had a couple of issues that brings him to the emergency department. First issue is shortness of breath. He feels that has been going on since yesterday. He is a dialysis patient and goes to dialysis Monday Wednesday and Friday.    He noticed his breathing issue yesterday before dialysis.  He mentioned to the dialysis doctor but does not feel any better after dialysis.  He denies any fevers. He denies any chest pain. Denies any coughing.  She also mentions trouble with numbness in his right leg. That has been going on for several months. He is noticed in the last few days he's having some memory issues as well. He does feel weak but that does not seem to be confined to the right side.  Past Medical History  Diagnosis Date  . HTN (hypertension)   . Anxiety   . Cardiomyopathy   . Renal insufficiency   . Myocardial infarction   . CVA (cerebral infarction)   . Chest pain   . Shortness of breath   . Stroke   . Headache(784.0)   . Arthritis   . Blindness    Past Surgical History  Procedure Laterality Date  . Tee without cardioversion  10/09/2011    Procedure: TRANSESOPHAGEAL ECHOCARDIOGRAM (TEE);  Surgeon: Lelon Perla, MD;  Location: [facility] ENDOSCOPY;  Service: Cardiovascular;  Laterality: N/A;  . Right hand    . Insertion of dialysis catheter  09/21/2012    Procedure: INSERTION OF DIALYSIS CATHETER;  Surgeon: Angelia Mould, MD;  Location: [facility];  Service: Vascular;  Laterality: N/A;  Right Internal Jugular Placement  . Left heart catheterization with coronary angiogram N/A 09/28/2012    Procedure: LEFT HEART CATHETERIZATION WITH CORONARY ANGIOGRAM;  Surgeon: Sherren Mocha, MD;  Location: [facility] CATH LAB;  Service: Cardiovascular;  Laterality: N/A;  . Left  heart catheterization with coronary angiogram N/A 07/07/2014    Procedure: LEFT HEART CATHETERIZATION WITH CORONARY ANGIOGRAM;  Surgeon: Leonie Man, MD;  Location: [facility] CATH LAB;  Service: Cardiovascular;  Laterality: N/A;   Family History  Problem Relation Age of Onset  . Heart attack Mother     MI in her 70s  . Anesthesia problems Neg Hx   . Hypotension Neg Hx   . Malignant hyperthermia Neg Hx   . Pseudochol deficiency Neg Hx    History  Substance Use Topics  . Smoking status: Current Every Day Smoker -- 0.50 packs/day for 50 years    Types: Cigarettes  . Smokeless tobacco: Never Used  . Alcohol Use: No     Comment: Occasional    Review of Systems  All other systems reviewed and are negative.     Allergies  Review of patient's allergies indicates no known allergies.  Home Medications   Prior to Admission medications   Medication Sig Start Date End Date Taking? Authorizing Provider  amLODipine (NORVASC) 10 MG tablet Take 10 mg by mouth daily. 03/28/15  Yes Historical Provider, MD  aspirin EC 81 MG tablet Take 1 tablet (81 mg total) by mouth daily. 03/06/15  Yes Lelon Perla, MD  calcium acetate (PHOSLO) 667 MG capsule Take 667 mg by mouth 3 (three) times daily with meals.   Yes Historical Provider, MD  cloNIDine (CATAPRES) 0.1 MG tablet Take 0.1 mg by mouth 2 (two) times daily. 03/28/15  Yes Historical Provider, MD  finasteride (PROSCAR) 5 MG tablet Take 5 mg by mouth daily. 03/28/15  Yes Historical Provider, MD  hydrALAZINE (APRESOLINE) 50 MG tablet Take 1 tablet (50 mg total) by mouth 2 (two) times daily. 09/09/14  Yes Barton Dubois, MD  metoprolol tartrate (LOPRESSOR) 12.5 mg TABS tablet Take 0.5 tablets (12.5 mg total) by mouth 2 (two) times daily. 09/09/14  Yes Barton Dubois, MD  multivitamin (RENA-VIT) TABS tablet Take 1 tablet by mouth daily. 03/28/15  Yes Historical Provider, MD  NIFEdipine (PROCARDIA XL/ADALAT-CC) 90 MG 24 hr tablet Take 90 mg by mouth daily.  03/28/15  Yes Historical Provider, MD  OVER THE COUNTER MEDICATION Apply 1 application topically daily as needed (for shoulder pain).   Yes Historical Provider, MD  pravastatin (PRAVACHOL) 40 MG tablet Take 1 tablet (40 mg total) by mouth every evening. 03/06/15  Yes Lelon Perla, MD  tamsulosin (FLOMAX) 0.4 MG CAPS capsule Take 0.4 mg by mouth 2 (two) times daily. 03/28/15  Yes Historical Provider, MD  terazosin (HYTRIN) 2 MG capsule Take 1 capsule (2 mg total) by mouth at bedtime. 09/09/14  Yes Barton Dubois, MD  amLODipine (NORVASC) 5 MG tablet Take 1 tablet (5 mg total) by mouth daily. Patient not taking: Reported on 04/03/2015 09/09/14   Barton Dubois, MD  B Complex-C-Folic Acid (DIALYVITE TABLET) TABS Take 1 tablet by mouth daily. Patient not taking: Reported on 04/03/2015 09/28/12   Sorin June Leap, MD  feeding supplement, RESOURCE BREEZE, (RESOURCE BREEZE) LIQD Take 1 Container by mouth 3 (three) times daily between meals. Patient not taking: Reported on 04/03/2015 05/20/14   Barton Dubois, MD  Nutritional Supplements (NEPRO) LIQD Take 237 mLs by mouth 2 (two) times daily. Patient not taking: Reported on 04/03/2015 05/20/14   Barton Dubois, MD   BP 210/91 mmHg  Pulse 85  Temp(Src) 98.3 F (36.8 C) (Oral)  Resp 22  SpO2 96% Physical Exam  Constitutional: He is oriented to person, place, and time. No distress.  HENT:  Head: Normocephalic and atraumatic.  Right Ear: External ear normal.  Left Ear: External ear normal.  Mouth/Throat: Oropharynx is clear and moist.  Eyes: Conjunctivae are normal. Right eye exhibits no discharge. Left eye exhibits no discharge. No scleral icterus.  Neck: Neck supple. No tracheal deviation present.  Cardiovascular: Normal rate, regular rhythm and intact distal pulses.   Pulmonary/Chest: Effort normal and breath sounds normal. No stridor. No respiratory distress. He has no wheezes. He has no rales.  Abdominal: Soft. Bowel sounds are normal. He exhibits no  distension. There is no tenderness. There is no rebound and no guarding.  Musculoskeletal: He exhibits no edema or tenderness.  Extremities are warm and well perfused  Neurological: He is alert and oriented to person, place, and time. A sensory deficit is present. No cranial nerve deficit (No facial droop, extraocular movements intact, tongue midline ). He exhibits normal muscle tone. He displays no seizure activity. Coordination normal.  Patient is unable to keep his arms off the bed for 10 seconds or his legs off the bed for 5 seconds, patient has decreased visual acuity at baseline, diminished sensation intact in right upper and right lower extremity, no left or right sided neglect, halting speech  Skin: Skin is warm and dry. No rash noted. He is not diaphoretic.  Psychiatric: He has a normal mood and affect.  Nursing note and vitals  reviewed.   ED Course  Procedures (including critical care time) Labs Review Labs Reviewed  BASIC METABOLIC PANEL - Abnormal; Notable for the following:    BUN 30 (*)    Creatinine, Ser 6.71 (*)    GFR calc non Af Amer 7 (*)    GFR calc Af Amer 9 (*)    All other components within normal limits  CBC - Abnormal; Notable for the following:    RBC 3.39 (*)    Hemoglobin 10.0 (*)    HCT 30.4 (*)    Platelets 147 (*)    All other components within normal limits  DIFFERENTIAL - Abnormal; Notable for the following:    Eosinophils Relative 6 (*)    All other components within normal limits  TROPONIN I  PROTIME-INR  APTT  I-STAT TROPOININ, ED    Imaging Review Dg Chest 2 View (if Patient Has Fever And/or Copd)  04/03/2015   CLINICAL DATA:  Shortness of Breath  EXAM: CHEST  2 VIEW  COMPARISON:  January 08, 2015  FINDINGS: There is no edema or consolidation. Heart size and pulmonary vascularity are normal. No adenopathy. There is atherosclerotic change in the aorta. There is degenerative change in each shoulder.  IMPRESSION: No edema or consolidation.    Electronically Signed   By: Lowella Grip III M.D.   On: 04/03/2015 18:43   Ct Head Wo Contrast  04/03/2015   CLINICAL DATA:  Progressive right leg numbness  EXAM: CT HEAD WITHOUT CONTRAST  TECHNIQUE: Contiguous axial images were obtained from the base of the skull through the vertex without intravenous contrast.  COMPARISON:  MRI brain 09/07/2014, CT brain 09/07/2014  FINDINGS: There is no evidence of mass effect, midline shift, or extra-axial fluid collections. There is no evidence of a space-occupying lesion or intracranial hemorrhage. There is no evidence of a cortical-based area of acute infarction. There is an old right PCA territory and left ACA territory infarct with encephalomalacia. There is generalized cerebral atrophy. There is periventricular white matter low attenuation likely secondary to microangiopathy.  The ventricles and sulci are appropriate for the patient's age. The basal cisterns are patent.  Visualized portions of the orbits are unremarkable. The visualized portions of the paranasal sinuses and mastoid air cells are unremarkable. Cerebrovascular atherosclerotic calcifications are noted.  The osseous structures are unremarkable.  IMPRESSION: 1. No acute intracranial pathology. 2. Chronic microvascular disease and cerebral atrophy.   Electronically Signed   By: Kathreen Devoid   On: 04/03/2015 20:24     EKG Interpretation   Date/Time:  Tuesday Apr 03 2015 17:12:42 EDT Ventricular Rate:  83 PR Interval:  162 QRS Duration: 98 QT Interval:  428 QTC Calculation: 503 R Axis:   13 Text Interpretation:  Sinus rhythm Ventricular premature complex Probable  left atrial enlargement Borderline repolarization abnormality Prolonged QT  interval No significant change since last tracing Confirmed by Merleen Picazo   MD-J, Audry Pecina [id number]) on 04/03/2015 5:20:26 PM      MDM   Final diagnoses:  Weakness  Renal failure    Pt is speaking more easily.  No respiratory difficulty.  Concerned about the  possibility of TIA stroke.  Will consult with medical service for possible admission, further workup, mri etc.  Exam is not definitive but he does have decreased sensation right side  Shortness of breath seems to have resolved.  No wheezing.  No sign of pulm edema.  Doubt ACS, PE.    Dorie Rank, MD 04/03/15 2109  D/w Dr  Niu.  Pt will be admitted to .  He does have CRF and requires dialysis  Dorie Rank, MD 04/03/15 2125

## 2015-04-03 NOTE — ED Notes (Signed)
Bed: WA18 Expected date:  Expected time:  Means of arrival:  Comments: EMS-SOB 

## 2015-04-03 NOTE — ED Notes (Signed)
Per EMS reports SOB since yesterday and new onset of short term memory loss for past few days.

## 2015-04-03 NOTE — H&P (Addendum)
[facility] History and Physical  Trevor [name] [id number] DOB: 01/13/1946 DOA: 04/03/2015  Referring physician: ED physician PCP: [name], MD  Specialists:   Chief Complaint: numbness and worsening weakness in right leg, SOB.  HPI: Trevor [name] is a 69 y.o. 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  Pt reports that he has generalized weakness for several months. He has right-sided weakness because of previous stroke, but he feels like his right-sided weakness has been worsening in the past several days. He also has numbness in the right leg.  Patient also reports having shortness of breath. No cough or chest pain. No symptoms of upper respiratory infection.He noticed his breathing issue yesterday before dialysis and did not feel better after dialysis. His SOB seems to have resolved in ED. When I saw pt in ED, he did no have SOB.  Currently patient denies fever, chills, running nose, ear pain, headaches, cough, chest pain, abdominal pain, diarrhea, constipation, dysuria, urgency, frequency, hematuria, skin rashes or leg swelling.   In ED, patient was found to have negative CT head for acute abnormalities. Negative chest x-ray for acute abnormalities. Troponin negative, INR1.05, temperature normal, no tachycardia, blood pressure 210/91, creatinine 6.71, BUN 30, potassium 3.8, EKG has no significant change compared with previous EKG. Patient is admitted to inpatient for further evaluation and treatment. Will transfer to [facility] due to dialysis.  Review of Systems:   Constitutional: No fever, no chills; Appetite normal; No weight loss, no weight gain, no fatigue.  HEENT: right eye blindness, left eye has no blurry vision.  no pharyngitis, no dysphagia  CV: No  chest pain, no palpitations, no PND, no orthopnea, no edema. Resp: No SOB, no cough, no pleuritic pain. GI: No nausea, no vomiting, no diarrhea, no melena, no hematochezia, no constipation, no abdominal pain.  GU: No dysuria, no hematuria, no frequency, no urgency.  MSK: no myalgias, no arthralgias.  Neuro: No headache, no history of seizures. Has numbness, weakness and decreased sensations in the right leg.  Psych: No suicidal or homicidal idiation Endo: No heat intolerance, no cold intolerance, no polyuria, no polydipsia  Skin: No rashes, no skin lesions.  Heme: No easy bruising.  Travel history:No recent long distant travel.   Where does patient live?  At home a lone Can patient participate in ADLs? some  Allergy: No Known Allergies  Past Medical History  Diagnosis Date  . HTN (hypertension)   . Anxiety   . Cardiomyopathy   . Renal insufficiency   . Myocardial infarction   . CVA (cerebral infarction)   . Chest pain   . Shortness of breath   . Stroke   . Headache(784.0)   . Arthritis   . Blindness     Past Surgical History  Procedure Laterality Date  . Tee without cardioversion  10/09/2011    Procedure: TRANSESOPHAGEAL ECHOCARDIOGRAM (TEE);  Surgeon: Lelon Perla, MD;  Location: [facility] ENDOSCOPY;  Service: Cardiovascular;  Laterality: N/A;  . Right hand    . Insertion of dialysis catheter  09/21/2012    Procedure: INSERTION OF DIALYSIS CATHETER;  Surgeon: Angelia Mould, MD;  Location: [facility];  Service: Vascular;  Laterality: N/A;  Right Internal Jugular Placement  . Left heart catheterization with coronary angiogram N/A 09/28/2012    Procedure: LEFT HEART CATHETERIZATION WITH  CORONARY ANGIOGRAM;  Surgeon: Sherren Mocha, MD;  Location: [facility] CATH LAB;  Service: Cardiovascular;  Laterality: N/A;  . Left heart catheterization with coronary angiogram N/A 07/07/2014    Procedure: LEFT HEART CATHETERIZATION WITH CORONARY ANGIOGRAM;  Surgeon: Leonie Man, MD;  Location:  [facility] CATH LAB;  Service: Cardiovascular;  Laterality: N/A;    Social History:  reports that he has been smoking Cigarettes.  He has a 25 pack-year smoking history. He has never used smokeless tobacco. He reports that he does not drink alcohol or use illicit drugs.  Family History:  Family History  Problem Relation Age of Onset  . Heart attack Mother     MI in her 51s  . Anesthesia problems Neg Hx   . Hypotension Neg Hx   . Malignant hyperthermia Neg Hx   . Pseudochol deficiency Neg Hx      Prior to Admission medications   Medication Sig Start Date End Date Taking? Authorizing Provider  amLODipine (NORVASC) 10 MG tablet Take 10 mg by mouth daily. 03/28/15  Yes Historical Provider, MD  aspirin EC 81 MG tablet Take 1 tablet (81 mg total) by mouth daily. 03/06/15  Yes Lelon Perla, MD  calcium acetate (PHOSLO) 667 MG capsule Take 667 mg by mouth 3 (three) times daily with meals.   Yes Historical Provider, MD  cloNIDine (CATAPRES) 0.1 MG tablet Take 0.1 mg by mouth 2 (two) times daily. 03/28/15  Yes Historical Provider, MD  finasteride (PROSCAR) 5 MG tablet Take 5 mg by mouth daily. 03/28/15  Yes Historical Provider, MD  hydrALAZINE (APRESOLINE) 50 MG tablet Take 1 tablet (50 mg total) by mouth 2 (two) times daily. 09/09/14  Yes Barton Dubois, MD  metoprolol tartrate (LOPRESSOR) 12.5 mg TABS tablet Take 0.5 tablets (12.5 mg total) by mouth 2 (two) times daily. 09/09/14  Yes Barton Dubois, MD  multivitamin (RENA-VIT) TABS tablet Take 1 tablet by mouth daily. 03/28/15  Yes Historical Provider, MD  NIFEdipine (PROCARDIA XL/ADALAT-CC) 90 MG 24 hr tablet Take 90 mg by mouth daily. 03/28/15  Yes Historical Provider, MD  OVER THE COUNTER MEDICATION Apply 1 application topically daily as needed (for shoulder pain).   Yes Historical Provider, MD  pravastatin (PRAVACHOL) 40 MG tablet Take 1 tablet (40 mg total) by mouth every evening. 03/06/15  Yes Lelon Perla, MD  tamsulosin (FLOMAX) 0.4 MG CAPS capsule  Take 0.4 mg by mouth 2 (two) times daily. 03/28/15  Yes Historical Provider, MD  terazosin (HYTRIN) 2 MG capsule Take 1 capsule (2 mg total) by mouth at bedtime. 09/09/14  Yes Barton Dubois, MD  amLODipine (NORVASC) 5 MG tablet Take 1 tablet (5 mg total) by mouth daily. Patient not taking: Reported on 04/03/2015 09/09/14   Barton Dubois, MD  B Complex-C-Folic Acid (DIALYVITE TABLET) TABS Take 1 tablet by mouth daily. Patient not taking: Reported on 04/03/2015 09/28/12   Sorin June Leap, MD  feeding supplement, RESOURCE BREEZE, (RESOURCE BREEZE) LIQD Take 1 Container by mouth 3 (three) times daily between meals. Patient not taking: Reported on 04/03/2015 05/20/14   Barton Dubois, MD  Nutritional Supplements (NEPRO) LIQD Take 237 mLs by mouth 2 (two) times daily. Patient not taking: Reported on 04/03/2015 05/20/14   Barton Dubois, MD    Physical Exam: Filed Vitals:   04/03/15 1714 04/03/15 1920 04/03/15 1951 04/03/15 2119  BP: 186/70 210/91  193/90  Pulse: 79 85  88  Temp: 98.3 F (36.8 C)  98.3 F (36.8 C)   TempSrc: Oral  Resp:  22  20  SpO2: 100% 96%  99%   General: Not in acute distress HEENT:       Eyes: PERRL, EOMI, no scleral icterus       ENT: No discharge from the ears and nose, no pharynx injection, no tonsillar enlargement. R eye blindness       Neck: No JVD, no bruit, no mass felt. Heme: No neck or axillary lymph node enlargement Cardiac: S1/S2, RRR, No murmurs, No gallops or rubs Pulm: Good air movement bilaterally. Clear to auscultation bilaterally. No rales, wheezing, rhonchi or rubs. Abd: Soft, nondistended, nontender, no rebound pain, no organomegaly, BS present Ext: No edema bilaterally. 2+DP/PT pulse bilaterally Musculoskeletal: No joint deformities, erythema, or stiffness, ROM full Skin: No rashes.  Neuro: Alert and oriented X3, cranial nerves II-XII grossly intact except for R facial droop, muscle strength 3/5 in R leg and 4/5 in all other extremities, sensation to light  touch decreased in right leg. Brachial reflex 1+ bilaterally. Knee reflex 1+ bilaterally. Negative Babinski's sign. Normal finger to nose test. Psych: Patient is not psychotic, no suicidal or hemocidal ideation.  Labs on Admission:  Basic Metabolic Panel:  Recent Labs Lab 04/03/15 1734  NA 141  K 3.8  CL 101  CO2 26  GLUCOSE 91  BUN 30*  CREATININE 6.71*  CALCIUM 9.1   Liver Function Tests: No results for input(s): AST, ALT, ALKPHOS, BILITOT, PROT, ALBUMIN in the last 168 hours. No results for input(s): LIPASE, AMYLASE in the last 168 hours. No results for input(s): AMMONIA in the last 168 hours. CBC:  Recent Labs Lab 04/03/15 1734  WBC 4.6  NEUTROABS 2.7  HGB 10.0*  HCT 30.4*  MCV 89.7  PLT 147*   Cardiac Enzymes:  Recent Labs Lab 04/03/15 1734  TROPONINI 0.03    BNP (last 3 results)  Recent Labs  01/08/15 1929  BNP >4500.0*    ProBNP (last 3 results)  Recent Labs  07/05/14 1910  PROBNP 69174.0*    CBG: No results for input(s): GLUCAP in the last 168 hours.  Radiological Exams on Admission: Dg Chest 2 View (if Patient Has Fever And/or Copd)  04/03/2015   CLINICAL DATA:  Shortness of Breath  EXAM: CHEST  2 VIEW  COMPARISON:  January 08, 2015  FINDINGS: There is no edema or consolidation. Heart size and pulmonary vascularity are normal. No adenopathy. There is atherosclerotic change in the aorta. There is degenerative change in each shoulder.  IMPRESSION: No edema or consolidation.   Electronically Signed   By: Lowella Grip III M.D.   On: 04/03/2015 18:43   Ct Head Wo Contrast  04/03/2015   CLINICAL DATA:  Progressive right leg numbness  EXAM: CT HEAD WITHOUT CONTRAST  TECHNIQUE: Contiguous axial images were obtained from the base of the skull through the vertex without intravenous contrast.  COMPARISON:  MRI brain 09/07/2014, CT brain 09/07/2014  FINDINGS: There is no evidence of mass effect, midline shift, or extra-axial fluid collections. There  is no evidence of a space-occupying lesion or intracranial hemorrhage. There is no evidence of a cortical-based area of acute infarction. There is an old right PCA territory and left ACA territory infarct with encephalomalacia. There is generalized cerebral atrophy. There is periventricular white matter low attenuation likely secondary to microangiopathy.  The ventricles and sulci are appropriate for the patient's age. The basal cisterns are patent.  Visualized portions of the orbits are unremarkable. The visualized portions of the paranasal sinuses and mastoid air  cells are unremarkable. Cerebrovascular atherosclerotic calcifications are noted.  The osseous structures are unremarkable.  IMPRESSION: 1. No acute intracranial pathology. 2. Chronic microvascular disease and cerebral atrophy.   Electronically Signed   By: Kathreen Devoid   On: 04/03/2015 20:24    EKG: Independently reviewed.  Abnormal findings:  Slight ST elevation in V1 to V2, slight ST depression in V5 to 6. PVC, LAE, LVH, all of which seem to be similar with previous EKG on 01/08/15.  Assessment/Plan Principal Problem:   Weakness Active Problems:   Tobacco use disorder   Chronic combined systolic and diastolic CHF (congestive heart failure)   Secondary hyperparathyroidism   Non compliance with medical treatment   Coronary artery disease, non-occlusive: Cath 2013 - Moderate D1 & RCA disease   ESRD on hemodialysis   Anemia of renal disease   Protein-calorie malnutrition, severe   CAD (coronary artery disease)   Stroke  1. Stroke:  Patient has hx of strokewith sequela of right-sided weakness. Patient presents with worsening weakness on the right side. He also has right facial droop and decreased sensation on the right side, concerning for recurrent TIA or stroke. - will get MRI brain now - continue ASA and pravastatin - check UDS - Risk factor modification: HgbA1c, fasting lipid panel  - PT consult, OT consult, Speech consult   2.  CAD: No chest pain now. EKG has no significant change compared with previous EKG.  -continue aspirin, pravastatin  3. Combined systolic and diastolic CHF: 2-D echo on 05/20/14 showed EF of 40% with grade 1 diastolic dysfunction. Currently patient is euvolemic. pat had shortness of breath, which has resolved currently. - watch volume status closely. - on HD  4. ESRD on HD (M/W/F): Creatinine 6.71, BUN 30. Patient had dialysis yesterday.   -left message to renal for HD  5. Tobacco abuse and Alcohol abuse: -Did counseling about importance of quitting smoking -Nicotine patch  6. Protein-calorie mal nutrition: -Nutrition supplements  7. HTN: Blood pressure is elevated at 210/91 on admission. Patient reports that he has not been taking his amlodipine and hydralazine for more than 2 weeks. Patient states he has been unable to get to the [place] for more medication and when he tried to use a retail drug store they told him it would be over $150 to purchase them. Pt. States he can not afford this and has just been going without. -Consult to Case manager -resume hydralazine -Continue clonidine, nifedipine, metoprolol -Will not restart amlodipine since patient is already on another CCB,  Nifedipin -hydralazine IV prn  8. SOB: has resolved. -prn nasal canula Oxygen  DVT ppx: SQ Heparin     Code Status: Full code Family Communication: None at bed side.    Disposition Plan: Admit to inpatient   Date of Service 04/03/2015    Ivor Costa [facility] Pager [phone]  If 7PM-7AM, please contact night-coverage [email or web address] Password [facility] 04/03/2015, 9:54 PM

## 2015-04-03 NOTE — ED Notes (Signed)
Went to do Stroke Swallow Screen. CT taking patient for scan. Will resume when patient returns.

## 2015-04-04 ENCOUNTER — Encounter (HOSPITAL_COMMUNITY): Payer: Self-pay | Admitting: *Deleted

## 2015-04-04 ENCOUNTER — Inpatient Hospital Stay (HOSPITAL_COMMUNITY): Payer: Medicare Other

## 2015-04-04 DIAGNOSIS — G459 Transient cerebral ischemic attack, unspecified: Principal | ICD-10-CM

## 2015-04-04 DIAGNOSIS — N2581 Secondary hyperparathyroidism of renal origin: Secondary | ICD-10-CM

## 2015-04-04 DIAGNOSIS — R531 Weakness: Secondary | ICD-10-CM | POA: Diagnosis not present

## 2015-04-04 DIAGNOSIS — G311 Senile degeneration of brain, not elsewhere classified: Secondary | ICD-10-CM | POA: Diagnosis not present

## 2015-04-04 DIAGNOSIS — N186 End stage renal disease: Secondary | ICD-10-CM | POA: Diagnosis not present

## 2015-04-04 DIAGNOSIS — I679 Cerebrovascular disease, unspecified: Secondary | ICD-10-CM | POA: Diagnosis not present

## 2015-04-04 DIAGNOSIS — E43 Unspecified severe protein-calorie malnutrition: Secondary | ICD-10-CM

## 2015-04-04 DIAGNOSIS — D631 Anemia in chronic kidney disease: Secondary | ICD-10-CM | POA: Diagnosis not present

## 2015-04-04 DIAGNOSIS — I5042 Chronic combined systolic (congestive) and diastolic (congestive) heart failure: Secondary | ICD-10-CM | POA: Diagnosis not present

## 2015-04-04 LAB — LIPID PANEL
CHOL/HDL RATIO: 3.9 ratio
CHOLESTEROL: 105 mg/dL (ref 0–200)
HDL: 27 mg/dL — ABNORMAL LOW (ref >40)
LDL CALC: 64 mg/dL (ref 0–99)
Triglycerides: 72 mg/dL (ref <150)
VLDL: 14 mg/dL (ref 0–40)

## 2015-04-04 LAB — RAPID URINE DRUG SCREEN, HOSP PERFORMED
Amphetamines: NOT DETECTED
BARBITURATES: NOT DETECTED
BENZODIAZEPINES: NOT DETECTED
Cocaine: NOT DETECTED
OPIATES: NOT DETECTED
Tetrahydrocannabinol: NOT DETECTED

## 2015-04-04 LAB — TSH: TSH: 1.549 u[IU]/mL (ref 0.350–4.500)

## 2015-04-04 LAB — VITAMIN B12: Vitamin B-12: 267 pg/mL (ref 180–914)

## 2015-04-04 MED ORDER — METOPROLOL TARTRATE 12.5 MG HALF TABLET
12.5000 mg | ORAL_TABLET | Freq: Two times a day (BID) | ORAL | Status: DC
  Administered 2015-04-04 – 2015-04-05 (×4): 12.5 mg via ORAL
  Filled 2015-04-04 (×4): qty 1

## 2015-04-04 MED ORDER — SENNOSIDES-DOCUSATE SODIUM 8.6-50 MG PO TABS: 1.0000 | ORAL_TABLET | Freq: Every evening | ORAL | Status: DC | PRN

## 2015-04-04 MED ORDER — FINASTERIDE 5 MG PO TABS
5.0000 mg | ORAL_TABLET | Freq: Every day | ORAL | Status: DC
  Administered 2015-04-04 – 2015-04-05 (×2): 5 mg via ORAL
  Filled 2015-04-04 (×2): qty 1

## 2015-04-04 MED ORDER — CLONIDINE HCL 0.1 MG PO TABS
0.1000 mg | ORAL_TABLET | Freq: Two times a day (BID) | ORAL | Status: DC
  Administered 2015-04-04 – 2015-04-05 (×4): 0.1 mg via ORAL
  Filled 2015-04-04 (×4): qty 1

## 2015-04-04 MED ORDER — RENA-VITE PO TABS
1.0000 | ORAL_TABLET | Freq: Every day | ORAL | Status: DC
  Administered 2015-04-04 – 2015-04-05 (×2): 1 via ORAL
  Filled 2015-04-04 (×2): qty 1

## 2015-04-04 MED ORDER — DOXERCALCIFEROL 4 MCG/2ML IV SOLN
2.0000 ug | INTRAVENOUS | Status: DC
  Administered 2015-04-05: 2 ug via INTRAVENOUS

## 2015-04-04 MED ORDER — CLOPIDOGREL BISULFATE 75 MG PO TABS
75.0000 mg | ORAL_TABLET | Freq: Every day | ORAL | Status: DC
  Administered 2015-04-04 – 2015-04-05 (×2): 75 mg via ORAL
  Filled 2015-04-04 (×2): qty 1

## 2015-04-04 MED ORDER — HEPARIN SODIUM (PORCINE) 5000 UNIT/ML IJ SOLN
5000.0000 [IU] | Freq: Three times a day (TID) | INTRAMUSCULAR | Status: DC
  Administered 2015-04-04 – 2015-04-05 (×6): 5000 [IU] via SUBCUTANEOUS
  Filled 2015-04-04 (×6): qty 1

## 2015-04-04 MED ORDER — SODIUM CHLORIDE 0.9 % IV SOLN
62.5000 mg | INTRAVENOUS | Status: DC
  Filled 2015-04-04: qty 5

## 2015-04-04 MED ORDER — OMEGA-3-ACID ETHYL ESTERS 1 G PO CAPS
1.0000 g | ORAL_CAPSULE | Freq: Two times a day (BID) | ORAL | Status: DC
  Administered 2015-04-04 – 2015-04-05 (×2): 1 g via ORAL
  Filled 2015-04-04 (×2): qty 1

## 2015-04-04 MED ORDER — HYDRALAZINE HCL 50 MG PO TABS
50.0000 mg | ORAL_TABLET | Freq: Two times a day (BID) | ORAL | Status: DC
Start: 2015-04-04 — End: 2015-04-05
  Administered 2015-04-04 – 2015-04-05 (×4): 50 mg via ORAL
  Filled 2015-04-04 (×4): qty 1

## 2015-04-04 MED ORDER — TRAMADOL HCL 50 MG PO TABS: 50.0000 mg | ORAL_TABLET | Freq: Four times a day (QID) | ORAL | Status: DC | PRN

## 2015-04-04 MED ORDER — TAMSULOSIN HCL 0.4 MG PO CAPS
0.4000 mg | ORAL_CAPSULE | Freq: Two times a day (BID) | ORAL | Status: DC
Start: 2015-04-04 — End: 2015-04-05
  Administered 2015-04-04 – 2015-04-05 (×4): 0.4 mg via ORAL
  Filled 2015-04-04 (×4): qty 1

## 2015-04-04 MED ORDER — ACETAMINOPHEN 325 MG PO TABS
650.0000 mg | ORAL_TABLET | Freq: Four times a day (QID) | ORAL | Status: DC | PRN
  Administered 2015-04-04: 650 mg via ORAL
  Filled 2015-04-04: qty 2

## 2015-04-04 MED ORDER — GABAPENTIN 100 MG PO CAPS
100.0000 mg | ORAL_CAPSULE | Freq: Three times a day (TID) | ORAL | Status: DC
  Administered 2015-04-04 – 2015-04-05 (×4): 100 mg via ORAL
  Filled 2015-04-04 (×4): qty 1

## 2015-04-04 MED ORDER — CALCIUM ACETATE (PHOS BINDER) 667 MG PO CAPS
667.0000 mg | ORAL_CAPSULE | Freq: Three times a day (TID) | ORAL | Status: DC
  Administered 2015-04-04 – 2015-04-05 (×4): 667 mg via ORAL
  Filled 2015-04-04 (×7): qty 1

## 2015-04-04 MED ORDER — PRAVASTATIN SODIUM 40 MG PO TABS
40.0000 mg | ORAL_TABLET | Freq: Every evening | ORAL | Status: DC
  Administered 2015-04-04 – 2015-04-05 (×2): 40 mg via ORAL
  Filled 2015-04-04 (×2): qty 1

## 2015-04-04 MED ORDER — NEPRO/CARBSTEADY PO LIQD
237.0000 mL | Freq: Two times a day (BID) | ORAL | Status: DC
  Administered 2015-04-04 – 2015-04-05 (×2): 237 mL via ORAL

## 2015-04-04 MED ORDER — TERAZOSIN HCL 2 MG PO CAPS
2.0000 mg | ORAL_CAPSULE | Freq: Every day | ORAL | Status: DC
Start: 2015-04-04 — End: 2015-04-05
  Administered 2015-04-04 (×2): 2 mg via ORAL
  Filled 2015-04-04 (×3): qty 1

## 2015-04-04 MED ORDER — NIFEDIPINE ER OSMOTIC RELEASE 90 MG PO TB24
90.0000 mg | ORAL_TABLET | Freq: Every day | ORAL | Status: DC
  Administered 2015-04-04: 90 mg via ORAL
  Filled 2015-04-04 (×2): qty 1

## 2015-04-04 MED ORDER — DARBEPOETIN ALFA 100 MCG/0.5ML IJ SOSY
100.0000 ug | PREFILLED_SYRINGE | INTRAMUSCULAR | Status: DC
  Administered 2015-04-05: 100 ug via INTRAVENOUS

## 2015-04-04 MED ORDER — ASPIRIN EC 81 MG PO TBEC: 81.0000 mg | DELAYED_RELEASE_TABLET | Freq: Every day | ORAL | Status: DC

## 2015-04-04 MED ORDER — NEPRO PO LIQD: 237.0000 mL | Freq: Two times a day (BID) | ORAL | Status: DC

## 2015-04-04 MED ORDER — STROKE: EARLY STAGES OF RECOVERY BOOK
Freq: Once | Status: AC
  Administered 2015-04-04: 03:00:00

## 2015-04-04 MED ORDER — NICOTINE 21 MG/24HR TD PT24
21.0000 mg | MEDICATED_PATCH | Freq: Every day | TRANSDERMAL | Status: DC
  Administered 2015-04-04 – 2015-04-05 (×2): 21 mg via TRANSDERMAL
  Filled 2015-04-04 (×2): qty 1

## 2015-04-04 MED ORDER — DARBEPOETIN ALFA 100 MCG/0.5ML IJ SOSY
100.0000 ug | PREFILLED_SYRINGE | INTRAMUSCULAR | Status: DC
  Administered 2015-04-05: 100 ug via INTRAVENOUS
  Filled 2015-04-04: qty 0.5

## 2015-04-04 NOTE — Progress Notes (Signed)
Patient's NIH increased from three to ten due to weakness in bilateral legs, patient said this was his baseline. MD aware. Will continue to monitor

## 2015-04-04 NOTE — Progress Notes (Addendum)
[facility] PROGRESS NOTE  Trevor [name] [id number] DOB: Mar 25, 1946 DOA: 04/03/2015 PCP: [name], MD  Assessment/Plan: 1-weakness and numbness right side: hx of prior stroke with residual deifict -most likely TIA, as MRI during this admission neg for acute infarcts  -will complete TIA work up -ASA changed to plavix for better secondary prevention -will arrange for [facility] services at discharge to help with conditioning and rehab -will also check TSH, B12 and will start neurontin for presumed neuropathy  2-ESRD: continue HD  -renal service consulted  3-HTN: will be permissive with HTN given TIA -will slowly resume home regimen and continue controlling volume with HD  4-chronic anemia: aransep and IV iron per renal service  5-Vit D deficiency: continue PO Vit D  6-secondary hyperparathyroidism: continue Phoslo and Hectorol  7-HLD: continue statins and fish oil  8-severe protein calorie malnutrition: continue nepro  9-chronic combined systolic and diastolic heart failure: stable and compensated -will continue low sodium diet -volume managed with HD   Code Status: Full Family Communication: no family at bedside Disposition Plan: home with home health services for PT/OT/RN at discharge; will finish TIA work up   Consultants:  Renal service   Procedures:  See below for x-ray reports  2-D echo: pending  Carotid duplex: pending  Antibiotics:  None   HPI/Subjective: Afebrile, no CP, no nausea, no vomiting and no abd pain. Patient reports slight SOB and generalized weakness.  Objective: Filed Vitals:   04/04/15 1414  BP: 112/50  Pulse: 72  Temp: 98.2 F (36.8 C)  Resp: 16    Intake/Output Summary (Last 24 hours) at 04/04/15 1727 Last data filed at 04/04/15 0400  Gross per 24 hour  Intake      0 ml  Output    300 ml  Net   -300 ml    Exam:   General:  Feeling better, but still very weak. Denies CP, nausea, vomiting, abd pain and is  afebrile  Cardiovascular: S1 and S2, no rubs or gallops  Respiratory: no wheezing, no crackles, good air movement  Abdomen: soft, NT, ND, positive BS  Musculoskeletal: no cyanosis, no edema  Neurologic: no dysarthria; CN 2-12 intact; 3/5 RLE, 4/5LLE; normal MS bilaterally on his upper extremities.   Data Reviewed: Basic Metabolic Panel:  Recent Labs Lab 04/03/15 1734  NA 141  K 3.8  CL 101  CO2 26  GLUCOSE 91  BUN 30*  CREATININE 6.71*  CALCIUM 9.1   CBC:  Recent Labs Lab 04/03/15 1734  WBC 4.6  NEUTROABS 2.7  HGB 10.0*  HCT 30.4*  MCV 89.7  PLT 147*   Cardiac Enzymes:  Recent Labs Lab 04/03/15 1734  TROPONINI 0.03   BNP (last 3 results)  Recent Labs  01/08/15 1929  BNP >4500.0*    ProBNP (last 3 results)  Recent Labs  07/05/14 1910  PROBNP [id number]*    Studies: Dg Chest 2 View (if Patient Has Fever And/or Copd)  04/03/2015   CLINICAL DATA:  Shortness of Breath  EXAM: CHEST  2 VIEW  COMPARISON:  January 08, 2015  FINDINGS: There is no edema or consolidation. Heart size and pulmonary vascularity are normal. No adenopathy. There is atherosclerotic change in the aorta. There is degenerative change in each shoulder.  IMPRESSION: No edema or consolidation.   Electronically Signed   By: Lowella Grip III M.D.   On: 04/03/2015 18:43   Ct Head Wo Contrast  04/03/2015   CLINICAL DATA:  Progressive right leg numbness  EXAM:  CT HEAD WITHOUT CONTRAST  TECHNIQUE: Contiguous axial images were obtained from the base of the skull through the vertex without intravenous contrast.  COMPARISON:  MRI brain 09/07/2014, CT brain 09/07/2014  FINDINGS: There is no evidence of mass effect, midline shift, or extra-axial fluid collections. There is no evidence of a space-occupying lesion or intracranial hemorrhage. There is no evidence of a cortical-based area of acute infarction. There is an old right PCA territory and left ACA territory infarct with encephalomalacia. There  is generalized cerebral atrophy. There is periventricular white matter low attenuation likely secondary to microangiopathy.  The ventricles and sulci are appropriate for the patient's age. The basal cisterns are patent.  Visualized portions of the orbits are unremarkable. The visualized portions of the paranasal sinuses and mastoid air cells are unremarkable. Cerebrovascular atherosclerotic calcifications are noted.  The osseous structures are unremarkable.  IMPRESSION: 1. No acute intracranial pathology. 2. Chronic microvascular disease and cerebral atrophy.   Electronically Signed   By: Kathreen Devoid   On: 04/03/2015 20:24   Mr Brain Wo Contrast  04/04/2015   CLINICAL DATA:  Initial evaluation for generalized weakness, history of right-sided weakness from prior stroke, possible worsening a right-sided weakness. Evaluate for stroke.  EXAM: MRI HEAD WITHOUT CONTRAST  TECHNIQUE: Multiplanar, multiecho pulse sequences of the brain and surrounding structures were obtained without intravenous contrast.  COMPARISON:  Prior CT from 04/30/2015.  FINDINGS: Diffuse prominence of the CSF containing spaces is compatible with generalized cerebral atrophy. Patchy and confluent T2/FLAIR hyperintensity within the periventricular and deep white matter both cerebral hemispheres present, most consistent with chronic small vessel ischemic changes. Small vessel type changes present within the pons as well.  Encephalomalacia within the left ACA territory and right PCA territory again seen, compatible with remote infarcts. Additional scattered remote lacunar infarcts present within the deep white matter of the left centrum semi ovale. Additional small cortical infarcts also noted within the posterior left temporal and occipital lobes.  No abnormal foci of restricted diffusion to suggest acute intracranial infarct identified. Gray-white matter differentiation is otherwise maintained. No acute or chronic intracranial hemorrhage. Normal  intravascular flow voids are maintained.  No mass lesion or midline shift. No hydrocephalus. No extra-axial fluid collection. Craniocervical junction within normal limits. Pituitary gland unremarkable.  No acute abnormality about the orbits.  Paranasal sinuses and mastoid air cells are clear. Inner ear structures are normal.  Scattered multilevel degenerative changes present within the visualized upper cervical spine. Bone marrow signal intensity within normal limits. Scalp soft tissues unremarkable.  IMPRESSION: 1. No acute intracranial infarct or other abnormality identified. 2. Remote left ACA and right PCA territory infarcts with additional ischemic changes as above. 3. Atrophy with moderate chronic microvascular ischemic disease.   Electronically Signed   By: Jeannine Boga M.D.   On: 04/04/2015 06:41    Scheduled Meds: . calcium acetate  667 mg Oral TID WC  . cloNIDine  0.1 mg Oral BID  . clopidogrel  75 mg Oral Daily  . darbepoetin (ARANESP) injection - DIALYSIS  100 mcg Intravenous Q Wed-HD  . [START ON 04/06/2015] doxercalciferol  2 mcg Intravenous Q M,W,F-HD  . feeding supplement (NEPRO CARB STEADY)  237 mL Oral BID BM  . [START ON 04/11/2015] ferric gluconate (FERRLECIT/NULECIT) IV  62.5 mg Intravenous Q Wed-HD  . finasteride  5 mg Oral Daily  . gabapentin  100 mg Oral TID  . heparin  5,000 Units Subcutaneous 3 times per day  . hydrALAZINE  50 mg  Oral BID  . metoprolol tartrate  12.5 mg Oral BID  . multivitamin  1 tablet Oral Daily  . nicotine  21 mg Transdermal Daily  . NIFEdipine  90 mg Oral Daily  . omega-3 acid ethyl esters  1 g Oral BID  . pravastatin  40 mg Oral QPM  . tamsulosin  0.4 mg Oral BID  . terazosin  2 mg Oral QHS   Continuous Infusions:   Principal Problem:   Weakness Active Problems:   Tobacco use disorder   Chronic combined systolic and diastolic CHF (congestive heart failure)   Secondary hyperparathyroidism   Non compliance with medical treatment    Coronary artery disease, non-occlusive: Cath 2013 - Moderate D1 & RCA disease   ESRD on hemodialysis   Anemia of renal disease   Protein-calorie malnutrition, severe   CAD (coronary artery disease)   Stroke   TIA (transient ischemic attack)    Time spent: 30 minutes    Barton Dubois  [facility] Pager [phone]. If 7PM-7AM, please contact night-coverage at [email or web address], password [facility] 04/04/2015, 5:27 PM  LOS: 1 day

## 2015-04-04 NOTE — Consult Note (Signed)
[facility] Renal Consultation Note  Indication for Consultation:  Management of ESRD/hemodialysis; anemia, hypertension/volume and secondary hyperparathyroidism  HPI: Trevor [name] is a 69 y.o. male admitted with  R sided weakness /cva / tia. Pt reports after Monday OP  HD (MWF [facility]) 04/02/15 he felt really weak  Came in hypotensive for him 100/57 and only 1 l uf leaving at 110 /55  And 1.6 above his edw.Reports   R Leg numbness /and weakness progressed yesterday prompting him to come to ER He was hypertensive then .Marland Kitchen No reported problems from op Kidney center. Last week he did told  Dr. Marval Regal he had ran out of his Clonidine and Nifedipine  meds  Scripts were given But "could not afford the 148 $ ". He denies fevers,chills , chest pain , sob or any Gi symptoms  to me. Today numbness and weakness resolving        Past Medical History  Diagnosis Date  . HTN (hypertension)   . Anxiety   . Cardiomyopathy   . Renal insufficiency   . Myocardial infarction   . CVA (cerebral infarction)   . Chest pain   . Shortness of breath   . Stroke   . Headache(784.0)   . Arthritis   . Blindness     Past Surgical History  Procedure Laterality Date  . Tee without cardioversion  10/09/2011    Procedure: TRANSESOPHAGEAL ECHOCARDIOGRAM (TEE);  Surgeon: Lelon Perla, MD;  Location: [facility] ENDOSCOPY;  Service: Cardiovascular;  Laterality: N/A;  . Right hand    . Insertion of dialysis catheter  09/21/2012    Procedure: INSERTION OF DIALYSIS CATHETER;  Surgeon: Angelia Mould, MD;  Location: [place];  Service: Vascular;  Laterality: N/A;  Right Internal Jugular Placement  . Left heart catheterization with coronary angiogram N/A 09/28/2012    Procedure: LEFT HEART CATHETERIZATION WITH CORONARY ANGIOGRAM;  Surgeon: Sherren Mocha, MD;  Location: [facility] CATH LAB;  Service: Cardiovascular;  Laterality: N/A;  . Left heart catheterization with coronary angiogram N/A 07/07/2014    Procedure: LEFT  HEART CATHETERIZATION WITH CORONARY ANGIOGRAM;  Surgeon: Leonie Man, MD;  Location: [facility] CATH LAB;  Service: Cardiovascular;  Laterality: N/A;      Family History  Problem Relation Age of Onset  . Heart attack Mother     MI in her 30s  . Anesthesia problems Neg Hx   . Hypotension Neg Hx   . Malignant hyperthermia Neg Hx   . Pseudochol deficiency Neg Hx       reports that he has been smoking Cigarettes.  He has a 25 pack-year smoking history. He has never used smokeless tobacco. He reports that he does not drink alcohol or use illicit drugs.  No Known Allergies  Prior to Admission medications   Medication Sig Start Date End Date Taking? Authorizing Provider  amLODipine (NORVASC) 10 MG tablet Take 10 mg by mouth daily. 03/28/15  Yes Historical Provider, MD  aspirin EC 81 MG tablet Take 1 tablet (81 mg total) by mouth daily. 03/06/15  Yes Lelon Perla, MD  calcium acetate (PHOSLO) 667 MG capsule Take 667 mg by mouth 3 (three) times daily with meals.   Yes Historical Provider, MD  cloNIDine (CATAPRES) 0.1 MG tablet Take 0.1 mg by mouth 2 (two) times daily. 03/28/15  Yes Historical Provider, MD  finasteride (PROSCAR) 5 MG tablet Take 5 mg by mouth daily. 03/28/15  Yes Historical Provider, MD  hydrALAZINE (APRESOLINE) 50 MG tablet Take 1  tablet (50 mg total) by mouth 2 (two) times daily. 09/09/14  Yes Barton Dubois, MD  metoprolol tartrate (LOPRESSOR) 12.5 mg TABS tablet Take 0.5 tablets (12.5 mg total) by mouth 2 (two) times daily. 09/09/14  Yes Barton Dubois, MD  multivitamin (RENA-VIT) TABS tablet Take 1 tablet by mouth daily. 03/28/15  Yes Historical Provider, MD  NIFEdipine (PROCARDIA XL/ADALAT-CC) 90 MG 24 hr tablet Take 90 mg by mouth daily. 03/28/15  Yes Historical Provider, MD  OVER THE COUNTER MEDICATION Apply 1 application topically daily as needed (for shoulder pain).   Yes Historical Provider, MD  pravastatin (PRAVACHOL) 40 MG tablet Take 1 tablet (40 mg total) by mouth every  evening. 03/06/15  Yes Lelon Perla, MD  tamsulosin (FLOMAX) 0.4 MG CAPS capsule Take 0.4 mg by mouth 2 (two) times daily. 03/28/15  Yes Historical Provider, MD  terazosin (HYTRIN) 2 MG capsule Take 1 capsule (2 mg total) by mouth at bedtime. 09/09/14  Yes Barton Dubois, MD  amLODipine (NORVASC) 5 MG tablet Take 1 tablet (5 mg total) by mouth daily. Patient not taking: Reported on 04/03/2015 09/09/14   Barton Dubois, MD  B Complex-C-Folic Acid (DIALYVITE TABLET) TABS Take 1 tablet by mouth daily. Patient not taking: Reported on 04/03/2015 09/28/12   Sorin June Leap, MD  feeding supplement, RESOURCE BREEZE, (RESOURCE BREEZE) LIQD Take 1 Container by mouth 3 (three) times daily between meals. Patient not taking: Reported on 04/03/2015 05/20/14   Barton Dubois, MD  Nutritional Supplements (NEPRO) LIQD Take 237 mLs by mouth 2 (two) times daily. Patient not taking: Reported on 04/03/2015 05/20/14   Barton Dubois, MD     Anti-infectives    None      Results for orders placed or performed during the hospital encounter of 04/03/15 (from the past 48 hour(s))  Basic metabolic panel  (if pt has PMH of COPD)     Status: Abnormal   Collection Time: 04/03/15  5:34 PM  Result Value Ref Range   Sodium 141 135 - 145 mmol/L   Potassium 3.8 3.5 - 5.1 mmol/L   Chloride 101 101 - 111 mmol/L   CO2 26 22 - 32 mmol/L   Glucose, Bld 91 70 - 99 mg/dL   BUN 30 (H) 6 - 20 mg/dL   Creatinine, Ser 6.71 (H) 0.61 - 1.24 mg/dL   Calcium 9.1 8.9 - 10.3 mg/dL   GFR calc non Af Amer 7 (L) >60 mL/min   GFR calc Af Amer 9 (L) >60 mL/min    Comment: (NOTE) The eGFR has been calculated using the CKD EPI equation. This calculation has not been validated in all clinical situations. eGFR's persistently <90 mL/min signify possible Chronic Kidney Disease.    Anion gap 14 5 - 15  CBC  (if pt has PMH of COPD)     Status: Abnormal   Collection Time: 04/03/15  5:34 PM  Result Value Ref Range   WBC 4.6 4.0 - 10.5 K/uL   RBC 3.39 (L)  4.22 - 5.81 MIL/uL   Hemoglobin 10.0 (L) 13.0 - 17.0 g/dL   HCT 30.4 (L) 39.0 - 52.0 %   MCV 89.7 78.0 - 100.0 fL   MCH 29.5 26.0 - 34.0 pg   MCHC 32.9 30.0 - 36.0 g/dL   RDW 13.8 11.5 - 15.5 %   Platelets 147 (L) 150 - 400 K/uL  Troponin I  (if patient has PMH of COPD)     Status: None   Collection Time: 04/03/15  5:34  PM  Result Value Ref Range   Troponin I 0.03 <0.031 ng/mL    Comment:        NO INDICATION OF MYOCARDIAL INJURY.   Protime-INR     Status: None   Collection Time: 04/03/15  5:34 PM  Result Value Ref Range   Prothrombin Time 13.8 11.6 - 15.2 seconds   INR 1.05 0.00 - 1.49  APTT     Status: None   Collection Time: 04/03/15  5:34 PM  Result Value Ref Range   aPTT 37 24 - 37 seconds    Comment:        IF BASELINE aPTT IS ELEVATED, SUGGEST PATIENT RISK ASSESSMENT BE USED TO DETERMINE APPROPRIATE ANTICOAGULANT THERAPY.   Differential     Status: Abnormal   Collection Time: 04/03/15  5:34 PM  Result Value Ref Range   Neutrophils Relative % 59 43 - 77 %   Neutro Abs 2.7 1.7 - 7.7 K/uL   Lymphocytes Relative 25 12 - 46 %   Lymphs Abs 1.2 0.7 - 4.0 K/uL   Monocytes Relative 10 3 - 12 %   Monocytes Absolute 0.5 0.1 - 1.0 K/uL   Eosinophils Relative 6 (H) 0 - 5 %   Eosinophils Absolute 0.3 0.0 - 0.7 K/uL   Basophils Relative 0 0 - 1 %   Basophils Absolute 0.0 0.0 - 0.1 K/uL  I-stat troponin, ED (not at [facility])     Status: None   Collection Time: 04/03/15  5:54 PM  Result Value Ref Range   Troponin i, poc 0.01 0.00 - 0.08 ng/mL   Comment 3            Comment: Due to the release kinetics of cTnI, a negative result within the first hours of the onset of symptoms does not rule out myocardial infarction with certainty. If myocardial infarction is still suspected, repeat the test at appropriate intervals.   Urine rapid drug screen (hosp performed)     Status: None   Collection Time: 04/04/15  2:35 AM  Result Value Ref Range   Opiates NONE DETECTED NONE  DETECTED   Cocaine NONE DETECTED NONE DETECTED   Benzodiazepines NONE DETECTED NONE DETECTED   Amphetamines NONE DETECTED NONE DETECTED   Tetrahydrocannabinol NONE DETECTED NONE DETECTED   Barbiturates NONE DETECTED NONE DETECTED    Comment:        DRUG SCREEN FOR MEDICAL PURPOSES ONLY.  IF CONFIRMATION IS NEEDED FOR ANY PURPOSE, NOTIFY LAB WITHIN 5 DAYS.        LOWEST DETECTABLE LIMITS FOR URINE DRUG SCREEN Drug Class       Cutoff (ng/mL) Amphetamine      1000 Barbiturate      200 Benzodiazepine   637 Tricyclics       858 Opiates          300 Cocaine          300 THC              50   Lipid panel     Status: Abnormal   Collection Time: 04/04/15  7:05 AM  Result Value Ref Range   Cholesterol 105 0 - 200 mg/dL   Triglycerides 72 <150 mg/dL   HDL 27 (L) >40 mg/dL   Total CHOL/HDL Ratio 3.9 RATIO   VLDL 14 0 - 40 mg/dL   LDL Cholesterol 64 0 - 99 mg/dL    Comment:        Total Cholesterol/HDL:CHD Risk Coronary Heart  Disease Risk Table                     Men   Women  1/2 Average Risk   3.4   3.3  Average Risk       5.0   4.4  2 X Average Risk   9.6   7.1  3 X Average Risk  23.4   11.0        Use the calculated Patient Ratio above and the CHD Risk Table to determine the patient's CHD Risk.        ATP III CLASSIFICATION (LDL):  <100     mg/dL   Optimal  100-129  mg/dL   Near or Above                    Optimal  130-159  mg/dL   Borderline  160-189  mg/dL   High  >190     mg/dL   Very High      ROS:  See hpi for positives   Physical Exam: Filed Vitals:   04/04/15 0740  BP: 157/69  Pulse: 70  Temp: 98.6 F (37 C)  Resp: 16     General: Alert BM appears slightly younger than stated age/ NAD, OX4/ appropriate  And cooperative HEENT: [place] , MMM, Nonicteric/ minimal r facial droop   Neck: Supple no jvd Heart: RRR, no mur, rub, or gallop Lungs: CTA bilat, unlabored breathing  Abdomen: BS pos. ,soft , NT, ND Extremities:  No pedal edema Skin: no overt  rash/ no pedal ulcers Neuro:alert ox4, moves all extrem  Dialysis Access:  Pos bruit LUA AVF  Dialysis Orders: Center:[facility]  on MWF . EDW 63kg HD Bath 2k, 2.25ca  Time 4hr Heparin 6,200. Access LUA AVF     Hectorol 2 mcg IV/HD  Mircera 59mg  q 4 weeks last given 03/21/15    Units IV/HD  Venofer  589mq wed hd  Other OP labs hgb 10.7 03/28/15   Ca 9.6  Phos 5.9 pth 658   Assessment/Plan 1. Weakness ?R Sided weakness = ?TIA /CVA admit team wu/  MRI brain =no acute findings  Remote L ACA and R PCA [place] infarcts/ atrophy  Mod chronic micro vas ischem dz 2. ESRD -  HD MWF ( [facility])  Hd today avoid large bp drop on hd  3. Hypertension/volume  - bp 157/69  Note op ho noncompliance with meds  dw pt  Compliance  With risk of recurrent CVAs/ Needs SW assistance / appears euvolemic  Ho EF 40%  2d echo  05/2014 4. Anemia  - hgb 10 ESA and weekly fe i hd 5. Metabolic bone disease -   Vit d po  On hd and binders 6. Nutrition -renal diet / supplement/ renal vit  7. HO  Tobac /  Ho substance   abuse - drug screen neg , denies drugs now 8. HO CAD- no cp   DaErnest HaberPA-C [id number]/03/2015, 11:43 AM    Pt seen, examined and agree w A/P as above.  RoKelly SplinterD pager [phone]  cell [phone]/03/2015, 5:14 PM

## 2015-04-04 NOTE — Evaluation (Signed)
Physical Therapy Evaluation Patient Details Name: Trevor [name] MRN: [id number] DOB: 04-23-1946 Today's Date: 04/04/2015   History of Present Illness  69 y.o. 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  Clinical Impression  Patient demonstrates deficits in functional mobility as indicated below. Will benefit from skilled PT to address deficits and maximize function. Will see as indicated and progress as tolerated. Recommend HHPT upon discharge.    Follow Up Recommendations Home health PT    Equipment Recommendations  None recommended by PT    Recommendations for Other Services       Precautions / Restrictions Precautions Precautions: Fall Restrictions Weight Bearing Restrictions: No      Mobility  Bed Mobility Overal bed mobility: Modified Independent             General bed mobility comments: increased time, no physical assit or cues to perform  Transfers Overall transfer level: Needs assistance Equipment used: Rolling walker (2 wheeled) Transfers: Sit to/from Stand Sit to Stand: Supervision         General transfer comment: no physical assist required  Ambulation/Gait Ambulation/Gait assistance: Min guard;Supervision Ambulation Distance (Feet): 160 Feet Assistive device: Rolling walker (2 wheeled) Gait Pattern/deviations: Step-through pattern;Decreased stride length;Trunk flexed;Drifts right/left Gait velocity: decreased Gait velocity interpretation: Below normal speed for age/gender General Gait Details: initially varied gait pattern, normalized with continued activity. slow cadence, VCs for upright posture   Stairs            Wheelchair Mobility    Modified Rankin (Stroke Patients Only)       Balance Overall balance assessment:  History of Falls (per patient)                                           Pertinent Vitals/Pain Pain Assessment: No/denies pain    Home Living Family/patient expects to be discharged to:: Private residence Living Arrangements: Alone Available Help at Discharge: Other (Comment) (Pt states his family looks in on him once a month) Type of Home: House Home Access: Level entry     Home Layout: One level Home Equipment: [place] - 4 wheels;Shower seat      Prior Function Level of Independence: Independent with assistive device(s)         Comments: Pt states he  could walk 100 feet with RW at home.  Pt states he struggles to manage at home.      Hand Dominance   Dominant Hand: Right    Extremity/Trunk Assessment   Upper Extremity Assessment: Defer to OT evaluation           Lower Extremity Assessment: Generalized weakness;RLE deficits/detail RLE Deficits / Details: reports RLE strength deficits, however inconsistent presentation of deficits noted.when asked upon assessment patient unable to elicit contraction, but with increased cues and during various funcitonal tasks noted full ROM and ability to complete range of motion throughout    Cervical / Trunk Assessment: Kyphotic  Communication   Communication: No difficulties  Cognition Arousal/Alertness: Awake/alert Behavior During Therapy: Flat affect Overall Cognitive Status: No family/caregiver present to determine baseline cognitive functioning                      General Comments  Exercises        Assessment/Plan    PT Assessment Patient needs continued PT services  PT Diagnosis Difficulty walking;Generalized weakness   PT Problem List Decreased strength;Decreased range of motion;Decreased activity tolerance;Decreased balance;Decreased mobility;Decreased coordination;Decreased safety awareness  PT Treatment Interventions DME instruction;Stair training;Functional mobility  training;Therapeutic activities;Therapeutic exercise;Balance training;Patient/family education   PT Goals (Current goals can be found in the Care Plan section) Acute Rehab PT Goals Patient Stated Goal: to get his leg better PT Goal Formulation: With patient Time For Goal Achievement: 04/18/15 Potential to Achieve Goals: Good    Frequency Min 3X/week   Barriers to discharge Decreased caregiver support      Co-evaluation               End of Session Equipment Utilized During Treatment: Gait belt Activity Tolerance: Patient tolerated treatment well;No increased pain Patient left: in chair;with call bell/phone within reach;with chair alarm set Nurse Communication: Mobility status         Time: [id number] PT Time Calculation (min) (ACUTE ONLY): 18 min   Charges:   PT Evaluation $Initial PT Evaluation Tier I: 1 Procedure     PT G CodesDuncan Dull Apr 07, 2015, 5:02 PM  Alben Deeds, [place] DPT  [phone]

## 2015-04-04 NOTE — Clinical Social Work Note (Addendum)
CSW consult acknowledged:  Clinical Social Worker received a consult in reference to "pt having trouble obtaining medication and transportation to appointments".   CSW to notify RNCM in regards to pt obtaining medications. Clinical Social Worker will sign off for now as social work intervention is no longer needed. Please consult [place] again if new need arises.  Glendon Axe, MSW, LCSWA [phone] 04/04/2015 10:00 AM

## 2015-04-04 NOTE — Care Management Note (Signed)
Case Management Note  Patient Details  Name: Trevor [name] MRN: [id number] Date of Birth: [birth year]  Subjective/Objective:                 Patient was admitted with weakness, shortness of breath. Lives at home alone.   Action/Plan:  Will follow for discharge needs pending PT/OT evals and physician orders. Expected Discharge Date:  04/05/15               Expected Discharge Plan:  [place]  In-House Referral:     Discharge planning Services  CM Consult  Post Acute Care Choice:    Choice offered to:     DME Arranged:    DME Agency:     HH Arranged:    [place] Agency:     Status of Service:     Medicare Important Message Given:    Date Medicare IM Given:    Medicare IM give by:    Date Additional Medicare IM Given:    Additional Medicare Important Message give by:     If discussed at [place] of Stay Meetings, dates discussed:    Additional Comments: 04/04/15 1050  Received a consult to discuss medication and transportation assistance.  Patient states that he has difficulty affording his medications, so he was not taking his blood pressure medications.  CM is unable to provide medication assistance at this time, as patient has Pharmacist, community.  Patient also stated that he was considering changing dialysis centers. He currently uses [facility] to get to dialysis appointments, but is considering changing to a clinic outside of the [facility] service area.  CM encouraged patient to speak with [facility] and his intended new dialysis center to discuss what transportation companies serve that particular area.  CM will discuss medication list with attending MD to see if any of patient's medications could be changed to something more affordable. Rolm Baptise, RN 04/04/2015, 11:00 AM

## 2015-04-05 ENCOUNTER — Observation Stay (HOSPITAL_COMMUNITY): Payer: Medicare Other

## 2015-04-05 DIAGNOSIS — R531 Weakness: Secondary | ICD-10-CM | POA: Diagnosis not present

## 2015-04-05 DIAGNOSIS — G459 Transient cerebral ischemic attack, unspecified: Secondary | ICD-10-CM | POA: Diagnosis not present

## 2015-04-05 DIAGNOSIS — I6529 Occlusion and stenosis of unspecified carotid artery: Secondary | ICD-10-CM | POA: Insufficient documentation

## 2015-04-05 DIAGNOSIS — D631 Anemia in chronic kidney disease: Secondary | ICD-10-CM | POA: Diagnosis not present

## 2015-04-05 DIAGNOSIS — I5042 Chronic combined systolic (congestive) and diastolic (congestive) heart failure: Secondary | ICD-10-CM | POA: Diagnosis not present

## 2015-04-05 DIAGNOSIS — I6523 Occlusion and stenosis of bilateral carotid arteries: Secondary | ICD-10-CM

## 2015-04-05 DIAGNOSIS — Z9119 Patient's noncompliance with other medical treatment and regimen: Secondary | ICD-10-CM

## 2015-04-05 DIAGNOSIS — N186 End stage renal disease: Secondary | ICD-10-CM | POA: Diagnosis not present

## 2015-04-05 LAB — RENAL FUNCTION PANEL
ALBUMIN: 2.8 g/dL — AB (ref 3.5–5.0)
ANION GAP: 13 (ref 5–15)
BUN: 55 mg/dL — ABNORMAL HIGH (ref 6–20)
CHLORIDE: 103 mmol/L (ref 101–111)
CO2: 23 mmol/L (ref 22–32)
Calcium: 9 mg/dL (ref 8.9–10.3)
Creatinine, Ser: 9.45 mg/dL — ABNORMAL HIGH (ref 0.61–1.24)
GFR, EST AFRICAN AMERICAN: 6 mL/min — AB (ref >60)
GFR, EST NON AFRICAN AMERICAN: 5 mL/min — AB (ref >60)
Glucose, Bld: 81 mg/dL (ref 70–99)
PHOSPHORUS: 6.7 mg/dL — AB (ref 2.5–4.6)
Potassium: 5.4 mmol/L — ABNORMAL HIGH (ref 3.5–5.1)
Sodium: 139 mmol/L (ref 135–145)

## 2015-04-05 LAB — CBC
HEMATOCRIT: 28.1 % — AB (ref 39.0–52.0)
HEMOGLOBIN: 9.4 g/dL — AB (ref 13.0–17.0)
MCH: 29.6 pg (ref 26.0–34.0)
MCHC: 33.5 g/dL (ref 30.0–36.0)
MCV: 88.4 fL (ref 78.0–100.0)
Platelets: 139 10*3/uL — ABNORMAL LOW (ref 150–400)
RBC: 3.18 MIL/uL — ABNORMAL LOW (ref 4.22–5.81)
RDW: 13.8 % (ref 11.5–15.5)
WBC: 4.7 10*3/uL (ref 4.0–10.5)

## 2015-04-05 LAB — HEMOGLOBIN A1C
HEMOGLOBIN A1C: 4.9 % (ref 4.8–5.6)
MEAN PLASMA GLUCOSE: 94 mg/dL

## 2015-04-05 MED ORDER — DOXERCALCIFEROL 4 MCG/2ML IV SOLN
INTRAVENOUS | Status: AC
  Filled 2015-04-05: qty 2

## 2015-04-05 MED ORDER — CLOPIDOGREL BISULFATE 75 MG PO TABS: 75.0000 mg | ORAL_TABLET | Freq: Every day | ORAL | Status: DC

## 2015-04-05 MED ORDER — GABAPENTIN 100 MG PO CAPS: 100.0000 mg | ORAL_CAPSULE | Freq: Three times a day (TID) | ORAL | Status: DC

## 2015-04-05 MED ORDER — HYDRALAZINE HCL 50 MG PO TABS: 50.0000 mg | ORAL_TABLET | Freq: Three times a day (TID) | ORAL | Status: DC

## 2015-04-05 MED ORDER — NIFEDIPINE ER OSMOTIC RELEASE 90 MG PO TB24: 90.0000 mg | ORAL_TABLET | Freq: Every day | ORAL | Status: DC

## 2015-04-05 MED ORDER — DARBEPOETIN ALFA 100 MCG/0.5ML IJ SOSY
PREFILLED_SYRINGE | INTRAMUSCULAR | Status: AC
  Administered 2015-04-05: 100 ug via INTRAVENOUS
  Filled 2015-04-05: qty 0.5

## 2015-04-05 MED ORDER — OMEGA-3-ACID ETHYL ESTERS 1 G PO CAPS: 1.0000 g | ORAL_CAPSULE | Freq: Two times a day (BID) | ORAL | Status: DC

## 2015-04-05 MED ORDER — NICOTINE 21 MG/24HR TD PT24: 21.0000 mg | MEDICATED_PATCH | Freq: Every day | TRANSDERMAL | Status: DC

## 2015-04-05 MED ORDER — METOPROLOL TARTRATE 25 MG PO TABS: 12.5000 mg | ORAL_TABLET | Freq: Two times a day (BID) | ORAL | Status: DC

## 2015-04-05 MED ORDER — DARBEPOETIN ALFA 100 MCG/0.5ML IJ SOSY: 100.0000 ug | PREFILLED_SYRINGE | INTRAMUSCULAR | Status: DC

## 2015-04-05 MED ORDER — NEPRO/CARBSTEADY PO LIQD: 237.0000 mL | Freq: Two times a day (BID) | ORAL | Status: DC

## 2015-04-05 NOTE — Evaluation (Signed)
Speech Language Pathology Evaluation Patient Details Name: Trevor [name] MRN: [id number] DOB: 04/13/1946 Today's Date: 04/05/2015 Time: [id number] SLP Time Calculation (min) (ACUTE ONLY): 31 min  Problem List:  Patient Active Problem List   Diagnosis Date Noted  . TIA (transient ischemic attack) 04/04/2015  . Weakness 04/03/2015  . Stroke 04/03/2015  . Abdominal aortic aneurysm 03/06/2015  . Generalized weakness 09/08/2014  . Acute encephalopathy 09/07/2014  . CAD (coronary artery disease) 08/08/2014  . NSTEMI (non-ST elevated myocardial infarction) - Presumed Type II in setting of HTN Emergency 07/07/2014  . Hypertensive emergency 07/05/2014  . Acute on chronic combined systolic and diastolic CHF (congestive heart failure) 07/05/2014  . Protein-calorie malnutrition, severe 05/20/2014  . Hypokalemia 05/19/2014  . Syncope and collapse 05/19/2014  . ESRD on hemodialysis 05/19/2014  . Anemia of renal disease 05/19/2014  . Coronary artery disease, non-occlusive: Cath 2013 - Moderate D1 & RCA disease 09/28/2012  . Anemia 09/27/2012  . Secondary hyperparathyroidism 09/27/2012  . Non compliance with medical treatment 09/27/2012  . Chronic combined systolic and diastolic CHF (congestive heart failure) 10/10/2011  . A-fib 10/08/2011  . History of stroke 10/07/2011  . Cocaine abuse 10/07/2011  . Severe sinus bradycardia 10/07/2011  . Nonischemic cardiomyopathy 09/18/2011  . Tobacco use disorder 08/07/2011  . Bruit 08/07/2011   Past Medical History:  Past Medical History  Diagnosis Date  . HTN (hypertension)   . Anxiety   . Cardiomyopathy   . Renal insufficiency   . Myocardial infarction   . CVA (cerebral infarction)   . Chest pain   . Shortness of breath   . Stroke   . Headache(784.0)   . Arthritis   . Blindness    Past Surgical History:  Past Surgical History  Procedure Laterality Date  . Tee without cardioversion  10/09/2011    Procedure: TRANSESOPHAGEAL ECHOCARDIOGRAM  (TEE);  Surgeon: Lelon Perla, MD;  Location: [facility] ENDOSCOPY;  Service: Cardiovascular;  Laterality: N/A;  . Right hand    . Insertion of dialysis catheter  09/21/2012    Procedure: INSERTION OF DIALYSIS CATHETER;  Surgeon: Angelia Mould, MD;  Location: [place];  Service: Vascular;  Laterality: N/A;  Right Internal Jugular Placement  . Left heart catheterization with coronary angiogram N/A 09/28/2012    Procedure: LEFT HEART CATHETERIZATION WITH CORONARY ANGIOGRAM;  Surgeon: Sherren Mocha, MD;  Location: [facility] CATH LAB;  Service: Cardiovascular;  Laterality: N/A;  . Left heart catheterization with coronary angiogram N/A 07/07/2014    Procedure: LEFT HEART CATHETERIZATION WITH CORONARY ANGIOGRAM;  Surgeon: Leonie Man, MD;  Location: [facility] CATH LAB;  Service: Cardiovascular;  Laterality: N/A;   HPI:  69 y.o. 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   Assessment / Plan / Recommendation Clinical Impression  Pt exhibited mild difficulties with retrieval of new information and selective attention during cognitive testing, for which he compensated with Mod I. Suspect that he is functioning at or clsoe to his baseline given adequate performance on testing. Pt does report that he has difficulty managing household responsibilities (in large part due to physical and visual difficulties) which is in part related to memory deficits. Therefore, pt may benefit from trial of [facility] SLP services. F/u can be deferred to next level of care.    SLP Assessment  All further Speech [facility] Pathology  needs can be addressed in the next venue of care    Follow Up Recommendations  Home health SLP    Pertinent Vitals/Pain Pain Assessment: No/denies pain   SLP Goals  Patient/Family Stated Goal: pt  states he needs help managing his household responsibilities from a physical/visual standpoint  SLP Evaluation Prior Functioning  Cognitive/Linguistic Baseline: Baseline deficits Baseline deficit details: pt reports baseline memory deficits Type of Home: House  Lives With: Alone Available Help at Discharge:  (says he has no one to rely on)   Cognition  Overall Cognitive Status: History of cognitive impairments - at baseline Arousal/Alertness: Awake/alert Orientation Level: Oriented X4 Attention: Selective Selective Attention: Impaired Selective Attention Impairment: Verbal complex (self-monitors and adjusts environment with Mod I) Memory: Impaired Memory Impairment: Retrieval deficit;Decreased recall of new information Awareness: Appears intact Problem Solving: Appears intact Safety/Judgment: Appears intact    Comprehension  Auditory Comprehension Overall Auditory Comprehension: Appears within functional limits for tasks assessed Visual Recognition/Discrimination Discrimination: Within Function Limits Reading Comprehension Reading Status: Not tested (visual deficits)    Expression Expression Primary Mode of Expression: Verbal Verbal Expression Overall Verbal Expression: Impaired at baseline (h/o aphasia per chart; minimal word-finding difficulties)   Oral / Motor Motor Speech Overall Motor Speech: Appears within functional limits for tasks assessed   GO Functional Assessment Tool Used: skilled clinical judgment Functional Limitations: Memory Memory Current Status [id number]): At least 1 percent but less than 20 percent impaired, limited or restricted Memory Goal Status [id number]): At least 1 percent but less than 20 percent impaired, limited or restricted Memory Discharge Status [phone]): At least 1 percent but less than 20 percent impaired, limited or restricted    Germain Osgood, M.A. CCC-SLP [phone]  Germain Osgood 04/05/2015, 4:47 PM

## 2015-04-05 NOTE — Progress Notes (Signed)
Patient is being d/c home. Patient has no ride to take him home. Social worker on call called and message felt.

## 2015-04-05 NOTE — Progress Notes (Signed)
Patient returned from dialysis, alert and oriented x 3. Will continue to monitor.

## 2015-04-05 NOTE — Progress Notes (Signed)
VASCULAR LAB PRELIMINARY  PRELIMINARY  PRELIMINARY  PRELIMINARY  Carotid duplex completed.    Preliminary report:  Right - 40% to 59% ICA stenosis. Left - 1% to 39% ICA stenosis upper end of scale. Vertebral artery flow is antegrade with mild calcific plaque noted and elevate velocities in the right vertebral artery  Trevor [name], RVS 04/05/2015, 2:28 PM

## 2015-04-05 NOTE — Progress Notes (Signed)
SLP Cancellation Note  Patient Details Name: [name] MRN: [id number] DOB: [birth year]   Cancelled treatment:       Reason Eval/Treat Not Completed: Patient at procedure or test/unavailable (HD).   Germain Osgood, M.A. CCC-SLP [phone]  Germain Osgood 04/05/2015, 1:13 PM

## 2015-04-05 NOTE — Progress Notes (Signed)
[facility] Progress Note  Assessment/Plan: 1. Weakness ?R Sided weakness = ?TIA /CVA admit team wu/ MRI brain =no acute findings Remote L ACA and R PCA Territory infarcts/ atrophy Mod chronic micro vas ischem dz- keep BP > 123XX123 systolic 2. ESRD - HD MWF ( [facility]) HD postponed from yesterday to today - off schedule - next HD Saturday labs pending; Have increased to full 4 hr treatment time from 3 hr to ease rate of UF 3. Hypertension/volume - bp 120s now down to 90s with goal 3 L - have reduced goal to 2L - goal based on bed scale - not standing; Hx noncompliance iwht meds With risk of recurrent CVAs/ Needs SW assistance / appears euvolemic Ho EF 40% 2d echo 05/2014 - CXR neg volume- needs higher EDW at d/c - usually gets to 64-64.5 as outpt - BP unusually low for him 5/2 4. Anemia - hgb 10 ESA and weekly fe- to give Aranesp 100 today 5. Metabolic bone disease - Vit d po On hd and binders- give usual meds 6. Nutrition -renal diet / supplement/ renal vit  7. Hx tobacco/ Ho substance abuse - drug screen neg , denies drugs now 8. Hx CAD- no cp    MB Bergman, PA-C [facility] Beeper [phone] 04/05/2015,8:45 AM  LOS: 2 days   Pt seen, examined and agree w A/P as above.  Kelly Splinter MD pager [phone]    cell [phone] 04/05/2015, 11:11 AM    Subjective:   Feels fuzzy this am. Like in a dream.  Right heel hurts since admitted.  Objective Filed Vitals:   04/05/15 0647 04/05/15 0715 04/05/15 0735 04/05/15 0800  BP: 123/54 125/65 124/57 121/62  Pulse: 58 60 60 60  Temp: 97.9 F (36.6 C) 97.6 F (36.4 C)    TempSrc: Oral Oral    Resp: 18 16 16 21   Weight:  65.5 kg (144 lb 6.4 oz)    SpO2: 98% 100% 100%    Physical Exam General: supine NAD Heart: RRR - ~60 Lungs: no rales Abdomen: soft NT Extremities: right heel tender - no erythema or breakdown Dialysis Access: left upper AVF Qb 400  Dialysis Orders: [facility] on MWF . EDW 63kg HD Bath  2k, 2.25ca Time 4hr Heparin 6,200. Access LUA AVF  Hectorol 2 mcg IV/HD Mircera 68mcg q 4 weeks last given 03/21/15 Units IV/HD Venofer 50mg  q wed hd  Other OP labs hgb 10.7 03/28/15 Ca 9.6 Phos 5.9 pth 658   Additional Objective Labs: Basic Metabolic Panel:  Recent Labs Lab 04/03/15 1734  NA 141  K 3.8  CL 101  CO2 26  GLUCOSE 91  BUN 30*  CREATININE 6.71*  CALCIUM 9.1  CBC:  Recent Labs Lab 04/03/15 1734  WBC 4.6  NEUTROABS 2.7  HGB 10.0*  HCT 30.4*  MCV 89.7  PLT 147*   Blood Culture    Component Value Date/Time   SDES BLOOD RIGHT FOREARM 09/08/2014 0340   SPECREQUEST BOTTLES DRAWN AEROBIC AND ANAEROBIC 10CC EACH 09/08/2014 0340   CULT  09/08/2014 0340    NO GROWTH 5 DAYS Performed at [facility] 09/14/2014 FINAL 09/08/2014 0340    Cardiac Enzymes:  Recent Labs Lab 04/03/15 1734  TROPONINI 0.03  Studies/Results: Dg Chest 2 View (if Patient Has Fever And/or Copd)  04/03/2015   CLINICAL DATA:  Shortness of Breath  EXAM: CHEST  2 VIEW  COMPARISON:  January 08, 2015  FINDINGS: There is no edema or  consolidation. Heart size and pulmonary vascularity are normal. No adenopathy. There is atherosclerotic change in the aorta. There is degenerative change in each shoulder.  IMPRESSION: No edema or consolidation.   Electronically Signed   By: Lowella Grip III M.D.   On: 04/03/2015 18:43   Ct Head Wo Contrast  04/03/2015   CLINICAL DATA:  Progressive right leg numbness  EXAM: CT HEAD WITHOUT CONTRAST  TECHNIQUE: Contiguous axial images were obtained from the base of the skull through the vertex without intravenous contrast.  COMPARISON:  MRI brain 09/07/2014, CT brain 09/07/2014  FINDINGS: There is no evidence of mass effect, midline shift, or extra-axial fluid collections. There is no evidence of a space-occupying lesion or intracranial hemorrhage. There is no evidence of a cortical-based area of acute infarction. There is an old  right PCA territory and left ACA territory infarct with encephalomalacia. There is generalized cerebral atrophy. There is periventricular white matter low attenuation likely secondary to microangiopathy.  The ventricles and sulci are appropriate for the patient's age. The basal cisterns are patent.  Visualized portions of the orbits are unremarkable. The visualized portions of the paranasal sinuses and mastoid air cells are unremarkable. Cerebrovascular atherosclerotic calcifications are noted.  The osseous structures are unremarkable.  IMPRESSION: 1. No acute intracranial pathology. 2. Chronic microvascular disease and cerebral atrophy.   Electronically Signed   By: Kathreen Devoid   On: 04/03/2015 20:24   Mr Brain Wo Contrast  04/04/2015   CLINICAL DATA:  Initial evaluation for generalized weakness, history of right-sided weakness from prior stroke, possible worsening a right-sided weakness. Evaluate for stroke.  EXAM: MRI HEAD WITHOUT CONTRAST  TECHNIQUE: Multiplanar, multiecho pulse sequences of the brain and surrounding structures were obtained without intravenous contrast.  COMPARISON:  Prior CT from 04/30/2015.  FINDINGS: Diffuse prominence of the CSF containing spaces is compatible with generalized cerebral atrophy. Patchy and confluent T2/FLAIR hyperintensity within the periventricular and deep white matter both cerebral hemispheres present, most consistent with chronic small vessel ischemic changes. Small vessel type changes present within the pons as well.  Encephalomalacia within the left ACA territory and right PCA territory again seen, compatible with remote infarcts. Additional scattered remote lacunar infarcts present within the deep white matter of the left centrum semi ovale. Additional small cortical infarcts also noted within the posterior left temporal and occipital lobes.  No abnormal foci of restricted diffusion to suggest acute intracranial infarct identified. Gray-white matter differentiation  is otherwise maintained. No acute or chronic intracranial hemorrhage. Normal intravascular flow voids are maintained.  No mass lesion or midline shift. No hydrocephalus. No extra-axial fluid collection. Craniocervical junction within normal limits. Pituitary gland unremarkable.  No acute abnormality about the orbits.  Paranasal sinuses and mastoid air cells are clear. Inner ear structures are normal.  Scattered multilevel degenerative changes present within the visualized upper cervical spine. Bone marrow signal intensity within normal limits. Scalp soft tissues unremarkable.  IMPRESSION: 1. No acute intracranial infarct or other abnormality identified. 2. Remote left ACA and right PCA territory infarcts with additional ischemic changes as above. 3. Atrophy with moderate chronic microvascular ischemic disease.   Electronically Signed   By: Jeannine Boga M.D.   On: 04/04/2015 06:41   Medications:   . calcium acetate  667 mg Oral TID WC  . cloNIDine  0.1 mg Oral BID  . clopidogrel  75 mg Oral Daily  . darbepoetin (ARANESP) injection - DIALYSIS  100 mcg Intravenous Q Thu-HD  . [START ON 04/06/2015] doxercalciferol  2 mcg Intravenous Q M,W,F-HD  . feeding supplement (NEPRO CARB STEADY)  237 mL Oral BID BM  . [START ON 04/11/2015] ferric gluconate (FERRLECIT/NULECIT) IV  62.5 mg Intravenous Q Wed-HD  . finasteride  5 mg Oral Daily  . gabapentin  100 mg Oral TID  . heparin  5,000 Units Subcutaneous 3 times per day  . hydrALAZINE  50 mg Oral BID  . metoprolol tartrate  12.5 mg Oral BID  . multivitamin  1 tablet Oral Daily  . nicotine  21 mg Transdermal Daily  . NIFEdipine  90 mg Oral Daily  . omega-3 acid ethyl esters  1 g Oral BID  . pravastatin  40 mg Oral QPM  . tamsulosin  0.4 mg Oral BID  . terazosin  2 mg Oral QHS

## 2015-04-05 NOTE — Discharge Summary (Signed)
Physician Discharge Summary  Trevor [name] [id number] DOB: [birth year] DOA: 04/03/2015  PCP: [name], MD  Admit date: 04/03/2015 Discharge date: 04/05/2015  Time spent: 30 minutes  Recommendations for Outpatient Follow-up:  1. Repeat CBC to follow Hgb trend 2. BMET to follow electrolytes 3. Reassess BP and adjust antihypertensive regimen as needed   Discharge Diagnoses:  Principal Problem:   Weakness Active Problems:   Tobacco use disorder   Chronic combined systolic and diastolic CHF (congestive heart failure)   Secondary hyperparathyroidism   Non compliance with medical treatment   Coronary artery disease, non-occlusive: Cath 2013 - Moderate D1 & RCA disease   ESRD on hemodialysis   Anemia of renal disease   Protein-calorie malnutrition, severe   CAD (coronary artery disease)   Stroke   TIA (transient ischemic attack)   Discharge Condition: stable and improved. Discharge home with [facility] services to assist with rehabilitation and medication compliance  Diet recommendation: heart healthy diet  Filed Weights   04/05/15 0715 04/05/15 1114  Weight: 65.5 kg (144 lb 6.4 oz) 62.6 kg (138 lb 0.1 oz)    History of present illness:  69 y.o. 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 Pt reports that he has generalized weakness for several months. He has right-sided weakness because of previous stroke, but he feels like his right-sided weakness has been worsening in the past several days. He also has numbness in the right leg.  SOB present before HD treatment in outpatient setting and was not relived after HD. No fever, no chills and no URI symptoms. Patient denies CP.  Hospital Course:  1-weakness and numbness right side: hx of prior stroke with residual  deifict -most likely TIA, as MRI during this admission neg for acute infarcts  -carotid dopplers with bilateral ICA  (R > L) and no signs of emboli seen on echo -ASA changed to plavix for better secondary prevention -will arrange for [facility] services at discharge to help with conditioning and rehab -will discharge on neurontin for neuropathy -B12 and TSH WNL  2-ESRD: continue HD M-W-F -renal service planning to adjust treatment time for better volume controlled and ability to achieve EDW.  3-HTN: will resume his antihypertensive agents -HD to help with BP and volume controlled -patient advise to follow heart healthy diet   4-chronic anemia: aransep and IV iron per renal service with HD therapy  5-Vit D deficiency: continue PO Vit D  6-secondary hyperparathyroidism: continue Phoslo and Hectorol  7-HLD: continue statins and fish oil now given HDL of 27  8-severe protein calorie malnutrition: continue nepro TID BM -patient encourage to follow good nutrition and hydration   9-chronic combined systolic and diastolic heart failure: stable and compensated -will continue low sodium diet -volume managed with HD -most recent echo during this admission with EF of 50%  Procedures:  2-D echo: - Left ventricle: The cavity size was normal. Wall thickness was increased in a pattern of moderate LVH. The estimated ejection fraction was 50%. Diffuse hypokinesis. Doppler parameters are consistent with abnormal left ventricular relaxation (grade 1 diastolic dysfunction). - Aortic valve: There was no stenosis. - Mitral valve: Mildly calcified annulus. There was no significant regurgitation. - Right ventricle: The cavity size was normal. Systolic function was normal. - Pulmonary arteries: No complete TR doppler jet so unable to estimate PA  systolic pressure. - Inferior vena cava: The vessel was normal in size. The respirophasic diameter changes were in the normal range (=  50%), consistent with normal central venous pressure.  Impressions: - Normal LV size with moderate LV hypertrophy. EF 50% with diffuse hypokinesis. Normal RV size and systolic function . No significant valvular abnormalities.   Carotid duplex:  Right - 40% to 59% ICA stenosis. Left - 1% to 39% ICA stenosis upper end of scale. Vertebral artery flow is antegrade with mild calcific plaque noted and elevate velocities in the right vertebral artery   See below for x-ray reports     Consultations:  Renal service  Discharge Exam: Filed Vitals:   04/05/15 1114  BP: 124/52  Pulse: 64  Temp:   Resp: 14      Discharge Instructions   Discharge Instructions    Diet - low sodium heart healthy    Complete by:  As directed      Discharge instructions    Complete by:  As directed   Follow up with PCP in 2 weeks Follow heart healthy/renal diet Take medications as prescribed Follow instructions from Home health services regarding rehabilitation Maintain adequate hydration          Current Discharge Medication List    START taking these medications   Details  clopidogrel (PLAVIX) 75 MG tablet Take 1 tablet (75 mg total) by mouth daily. Qty: 30 tablet, Refills: 2    gabapentin (NEURONTIN) 100 MG capsule Take 1 capsule (100 mg total) by mouth 3 (three) times daily. Qty: 90 capsule, Refills: 2    nicotine (NICODERM CQ - DOSED IN MG/24 HOURS) 21 mg/24hr patch Place 1 patch (21 mg total) onto the skin daily. Qty: 28 patch, Refills: 0    omega-3 acid ethyl esters (LOVAZA) 1 G capsule Take 1 capsule (1 g total) by mouth 2 (two) times daily. Qty: 60 capsule, Refills: 2      CONTINUE these medications which have CHANGED   Details  hydrALAZINE (APRESOLINE) 50 MG tablet Take 1 tablet (50 mg total) by mouth 3 (three) times daily. Qty: 90 tablet, Refills: 3    metoprolol tartrate (LOPRESSOR) 25 MG tablet Take 0.5 tablets (12.5 mg total) by mouth 2 (two) times daily. Qty:  60 tablet, Refills: 2    NIFEdipine (PROCARDIA XL/ADALAT-CC) 90 MG 24 hr tablet Take 1 tablet (90 mg total) by mouth daily. Qty: 30 tablet, Refills: 2    Nutritional Supplements (FEEDING SUPPLEMENT, NEPRO CARB STEADY,) LIQD Take 237 mLs by mouth 2 (two) times daily between meals. Refills: 0      CONTINUE these medications which have NOT CHANGED   Details  calcium acetate (PHOSLO) 667 MG capsule Take 667 mg by mouth 3 (three) times daily with meals.    cloNIDine (CATAPRES) 0.1 MG tablet Take 0.1 mg by mouth 2 (two) times daily. Refills: 0    finasteride (PROSCAR) 5 MG tablet Take 5 mg by mouth daily. Refills: 0    multivitamin (RENA-VIT) TABS tablet Take 1 tablet by mouth daily. Refills: 0    OVER THE COUNTER MEDICATION Apply 1 application topically daily as needed (for shoulder pain).    pravastatin (PRAVACHOL) 40 MG tablet Take 1 tablet (40 mg total) by mouth every evening. Qty: 30 tablet, Refills: 12   Associated Diagnoses: Hyperlipemia    tamsulosin (FLOMAX) 0.4 MG CAPS capsule Take 0.4 mg by mouth 2 (two) times daily. Refills: 0    terazosin (HYTRIN) 2 MG capsule Take 1  capsule (2 mg total) by mouth at bedtime. Qty: 30 capsule, Refills: 3    B Complex-C-Folic Acid (DIALYVITE TABLET) TABS Take 1 tablet by mouth daily. Refills: 0      STOP taking these medications     amLODipine (NORVASC) 10 MG tablet      aspirin EC 81 MG tablet      amLODipine (NORVASC) 5 MG tablet        No Known Allergies Follow-up Information    Follow up with COLADONATO,JOSEPH A, MD. Schedule an appointment as soon as possible for a visit in 10 days.   Specialty:  Nephrology   Contact information:   [place] [address] [phone]       The results of significant diagnostics from this hospitalization (including imaging, microbiology, ancillary and laboratory) are listed below for reference.    Significant Diagnostic Studies: Dg Chest 2 View (if Patient Has Fever  And/or Copd)  04/03/2015   CLINICAL DATA:  Shortness of Breath  EXAM: CHEST  2 VIEW  COMPARISON:  January 08, 2015  FINDINGS: There is no edema or consolidation. Heart size and pulmonary vascularity are normal. No adenopathy. There is atherosclerotic change in the aorta. There is degenerative change in each shoulder.  IMPRESSION: No edema or consolidation.   Electronically Signed   By: Lowella Grip III M.D.   On: 04/03/2015 18:43   Ct Head Wo Contrast  04/03/2015   CLINICAL DATA:  Progressive right leg numbness  EXAM: CT HEAD WITHOUT CONTRAST  TECHNIQUE: Contiguous axial images were obtained from the base of the skull through the vertex without intravenous contrast.  COMPARISON:  MRI brain 09/07/2014, CT brain 09/07/2014  FINDINGS: There is no evidence of mass effect, midline shift, or extra-axial fluid collections. There is no evidence of a space-occupying lesion or intracranial hemorrhage. There is no evidence of a cortical-based area of acute infarction. There is an old right PCA territory and left ACA territory infarct with encephalomalacia. There is generalized cerebral atrophy. There is periventricular white matter low attenuation likely secondary to microangiopathy.  The ventricles and sulci are appropriate for the patient's age. The basal cisterns are patent.  Visualized portions of the orbits are unremarkable. The visualized portions of the paranasal sinuses and mastoid air cells are unremarkable. Cerebrovascular atherosclerotic calcifications are noted.  The osseous structures are unremarkable.  IMPRESSION: 1. No acute intracranial pathology. 2. Chronic microvascular disease and cerebral atrophy.   Electronically Signed   By: Kathreen Devoid   On: 04/03/2015 20:24   Mr Brain Wo Contrast  04/04/2015   CLINICAL DATA:  Initial evaluation for generalized weakness, history of right-sided weakness from prior stroke, possible worsening a right-sided weakness. Evaluate for stroke.  EXAM: MRI HEAD WITHOUT  CONTRAST  TECHNIQUE: Multiplanar, multiecho pulse sequences of the brain and surrounding structures were obtained without intravenous contrast.  COMPARISON:  Prior CT from 04/30/2015.  FINDINGS: Diffuse prominence of the CSF containing spaces is compatible with generalized cerebral atrophy. Patchy and confluent T2/FLAIR hyperintensity within the periventricular and deep white matter both cerebral hemispheres present, most consistent with chronic small vessel ischemic changes. Small vessel type changes present within the pons as well.  Encephalomalacia within the left ACA territory and right PCA territory again seen, compatible with remote infarcts. Additional scattered remote lacunar infarcts present within the deep white matter of the left centrum semi ovale. Additional small cortical infarcts also noted within the posterior left temporal and occipital lobes.  No abnormal foci of restricted  diffusion to suggest acute intracranial infarct identified. Gray-white matter differentiation is otherwise maintained. No acute or chronic intracranial hemorrhage. Normal intravascular flow voids are maintained.  No mass lesion or midline shift. No hydrocephalus. No extra-axial fluid collection. Craniocervical junction within normal limits. Pituitary gland unremarkable.  No acute abnormality about the orbits.  Paranasal sinuses and mastoid air cells are clear. Inner ear structures are normal.  Scattered multilevel degenerative changes present within the visualized upper cervical spine. Bone marrow signal intensity within normal limits. Scalp soft tissues unremarkable.  IMPRESSION: 1. No acute intracranial infarct or other abnormality identified. 2. Remote left ACA and right PCA territory infarcts with additional ischemic changes as above. 3. Atrophy with moderate chronic microvascular ischemic disease.   Electronically Signed   By: Jeannine Boga M.D.   On: 04/04/2015 06:41   Labs: Basic Metabolic Panel:  Recent  Labs Lab 04/03/15 1734 04/05/15 0846  NA 141 139  K 3.8 5.4*  CL 101 103  CO2 26 23  GLUCOSE 91 81  BUN 30* 55*  CREATININE 6.71* 9.45*  CALCIUM 9.1 9.0  PHOS  --  6.7*   Liver Function Tests:  Recent Labs Lab 04/05/15 0846  ALBUMIN 2.8*   CBC:  Recent Labs Lab 04/03/15 1734 04/05/15 0846  WBC 4.6 4.7  NEUTROABS 2.7  --   HGB 10.0* 9.4*  HCT 30.4* 28.1*  MCV 89.7 88.4  PLT 147* 139*   Cardiac Enzymes:  Recent Labs Lab 04/03/15 1734  TROPONINI 0.03   BNP: BNP (last 3 results)  Recent Labs  01/08/15 1929  BNP >4500.0*    ProBNP (last 3 results)  Recent Labs  07/05/14 1910  PROBNP [id number]*   Signed:  Barton Dubois  [facility] 04/05/2015, 5:12 PM

## 2015-04-05 NOTE — Progress Notes (Signed)
OT Cancellation Note  Patient Details Name: Trevor [name] MRN: [id number] DOB: [birth year]   Cancelled Treatment:    Reason Eval/Treat Not Completed: Patient at procedure or test/ unavailable (HD currently off unit)  Parke Poisson B 04/05/2015, 7:52 AM  Pager: [phone]

## 2015-04-06 ENCOUNTER — Telehealth: Payer: Self-pay | Admitting: Cardiology

## 2015-04-06 NOTE — Telephone Encounter (Signed)
Result reading on last AAA given to pt.

## 2015-04-06 NOTE — Care Management Note (Addendum)
Case Management Note  Patient Details  Name: Trevor [name] MRN: [id number] Date of Birth: 17-Nov-1946  Subjective/Objective:                    Action/Plan:   Expected Discharge Date:  04/05/15               Expected Discharge Plan:  [place]  In-House Referral:     Discharge planning Services  CM Consult  Post Acute Care Trevor:    Trevor offered to:     DME Arranged:    DME Agency:     HH Arranged:    [place] Agency:     Status of Service:     Medicare Important Message Given:    Date Medicare IM Given:    Medicare IM give by:    Date Additional Medicare IM Given:    Additional Medicare Important Message give by:     If discussed at [place] of Stay Meetings, dates discussed:    Additional Comments:  04/06/15 1640  Attempted to reach patient again regarding home health orders.  Additional voicemail left requesting a return call.   Home health orders were placed for patient at discharge, but CM was unable to discuss with patient before he discharged home.  Attempted to reach patient this morning to make arrangements.  Voicemail was left requesting a return call. Rolm Baptise, RN 04/06/2015, 9:41 AM

## 2015-04-06 NOTE — Telephone Encounter (Signed)
Pt called in stating that when he was in the hospital, he had some test ran on him and he would like to know the results.He did not know the name of the test that was administered.  Please f/u with the pt  Thanks

## 2015-04-07 ENCOUNTER — Telehealth: Payer: Self-pay | Admitting: Surgery

## 2015-04-07 NOTE — Telephone Encounter (Signed)
ED CM attempted to contact patient concerning arranging recommended [facility] services. LVM for patient to return call at [phone]. CM will follow up with a 2nd attempt.

## 2015-04-08 ENCOUNTER — Telehealth: Payer: Self-pay | Admitting: Surgery

## 2015-04-08 NOTE — Telephone Encounter (Signed)
ED CM spoke with patient concerning recommendations for [facility] services RN, PT/OT/ SW/ HHA, patient is agreeable with the recommendation. Offered Choice of HH Agency [facility] was selected.  Referral was called in to [facility]. Note was also made to [facility] that patient is on HD M/W/F at 12 noon. Patient was made aware that a nurse from [facility] will contact him within 24 hours to arrange a date and time for the initial assessment visit. Patient verbalized understanding and appreciations for the assistance.  No further CM needs identified.

## 2015-04-10 DIAGNOSIS — I5042 Chronic combined systolic (congestive) and diastolic (congestive) heart failure: Secondary | ICD-10-CM | POA: Diagnosis not present

## 2015-04-10 DIAGNOSIS — N186 End stage renal disease: Secondary | ICD-10-CM | POA: Diagnosis not present

## 2015-04-10 DIAGNOSIS — E559 Vitamin D deficiency, unspecified: Secondary | ICD-10-CM | POA: Diagnosis not present

## 2015-04-10 DIAGNOSIS — I2510 Atherosclerotic heart disease of native coronary artery without angina pectoris: Secondary | ICD-10-CM | POA: Diagnosis not present

## 2015-04-10 DIAGNOSIS — D631 Anemia in chronic kidney disease: Secondary | ICD-10-CM | POA: Diagnosis not present

## 2015-04-10 DIAGNOSIS — Z992 Dependence on renal dialysis: Secondary | ICD-10-CM | POA: Diagnosis not present

## 2015-04-10 DIAGNOSIS — I69351 Hemiplegia and hemiparesis following cerebral infarction affecting right dominant side: Secondary | ICD-10-CM | POA: Diagnosis not present

## 2015-04-10 DIAGNOSIS — I120 Hypertensive chronic kidney disease with stage 5 chronic kidney disease or end stage renal disease: Secondary | ICD-10-CM | POA: Diagnosis not present

## 2015-04-10 DIAGNOSIS — Z720 Tobacco use: Secondary | ICD-10-CM | POA: Diagnosis not present

## 2015-04-10 DIAGNOSIS — E43 Unspecified severe protein-calorie malnutrition: Secondary | ICD-10-CM | POA: Diagnosis not present

## 2015-04-12 DIAGNOSIS — N186 End stage renal disease: Secondary | ICD-10-CM | POA: Diagnosis not present

## 2015-04-12 DIAGNOSIS — I120 Hypertensive chronic kidney disease with stage 5 chronic kidney disease or end stage renal disease: Secondary | ICD-10-CM | POA: Diagnosis not present

## 2015-04-12 DIAGNOSIS — I2510 Atherosclerotic heart disease of native coronary artery without angina pectoris: Secondary | ICD-10-CM | POA: Diagnosis not present

## 2015-04-12 DIAGNOSIS — I69351 Hemiplegia and hemiparesis following cerebral infarction affecting right dominant side: Secondary | ICD-10-CM | POA: Diagnosis not present

## 2015-04-12 DIAGNOSIS — D631 Anemia in chronic kidney disease: Secondary | ICD-10-CM | POA: Diagnosis not present

## 2015-04-12 DIAGNOSIS — I5042 Chronic combined systolic (congestive) and diastolic (congestive) heart failure: Secondary | ICD-10-CM | POA: Diagnosis not present

## 2015-04-13 DIAGNOSIS — N186 End stage renal disease: Secondary | ICD-10-CM | POA: Diagnosis not present

## 2015-04-13 DIAGNOSIS — D631 Anemia in chronic kidney disease: Secondary | ICD-10-CM | POA: Diagnosis not present

## 2015-04-13 DIAGNOSIS — I2510 Atherosclerotic heart disease of native coronary artery without angina pectoris: Secondary | ICD-10-CM | POA: Diagnosis not present

## 2015-04-13 DIAGNOSIS — I120 Hypertensive chronic kidney disease with stage 5 chronic kidney disease or end stage renal disease: Secondary | ICD-10-CM | POA: Diagnosis not present

## 2015-04-13 DIAGNOSIS — I5042 Chronic combined systolic (congestive) and diastolic (congestive) heart failure: Secondary | ICD-10-CM | POA: Diagnosis not present

## 2015-04-13 DIAGNOSIS — I69351 Hemiplegia and hemiparesis following cerebral infarction affecting right dominant side: Secondary | ICD-10-CM | POA: Diagnosis not present

## 2015-04-16 DIAGNOSIS — N186 End stage renal disease: Secondary | ICD-10-CM | POA: Diagnosis not present

## 2015-04-16 DIAGNOSIS — I120 Hypertensive chronic kidney disease with stage 5 chronic kidney disease or end stage renal disease: Secondary | ICD-10-CM | POA: Diagnosis not present

## 2015-04-16 DIAGNOSIS — D631 Anemia in chronic kidney disease: Secondary | ICD-10-CM | POA: Diagnosis not present

## 2015-04-16 DIAGNOSIS — I2510 Atherosclerotic heart disease of native coronary artery without angina pectoris: Secondary | ICD-10-CM | POA: Diagnosis not present

## 2015-04-16 DIAGNOSIS — I5042 Chronic combined systolic (congestive) and diastolic (congestive) heart failure: Secondary | ICD-10-CM | POA: Diagnosis not present

## 2015-04-16 DIAGNOSIS — I69351 Hemiplegia and hemiparesis following cerebral infarction affecting right dominant side: Secondary | ICD-10-CM | POA: Diagnosis not present

## 2015-04-17 DIAGNOSIS — N186 End stage renal disease: Secondary | ICD-10-CM | POA: Diagnosis not present

## 2015-04-17 DIAGNOSIS — I2510 Atherosclerotic heart disease of native coronary artery without angina pectoris: Secondary | ICD-10-CM | POA: Diagnosis not present

## 2015-04-17 DIAGNOSIS — I120 Hypertensive chronic kidney disease with stage 5 chronic kidney disease or end stage renal disease: Secondary | ICD-10-CM | POA: Diagnosis not present

## 2015-04-17 DIAGNOSIS — I5042 Chronic combined systolic (congestive) and diastolic (congestive) heart failure: Secondary | ICD-10-CM | POA: Diagnosis not present

## 2015-04-17 DIAGNOSIS — D631 Anemia in chronic kidney disease: Secondary | ICD-10-CM | POA: Diagnosis not present

## 2015-04-17 DIAGNOSIS — I69351 Hemiplegia and hemiparesis following cerebral infarction affecting right dominant side: Secondary | ICD-10-CM | POA: Diagnosis not present

## 2015-04-18 DIAGNOSIS — D631 Anemia in chronic kidney disease: Secondary | ICD-10-CM | POA: Diagnosis not present

## 2015-04-18 DIAGNOSIS — I2510 Atherosclerotic heart disease of native coronary artery without angina pectoris: Secondary | ICD-10-CM | POA: Diagnosis not present

## 2015-04-18 DIAGNOSIS — N186 End stage renal disease: Secondary | ICD-10-CM | POA: Diagnosis not present

## 2015-04-18 DIAGNOSIS — I5042 Chronic combined systolic (congestive) and diastolic (congestive) heart failure: Secondary | ICD-10-CM | POA: Diagnosis not present

## 2015-04-18 DIAGNOSIS — I69351 Hemiplegia and hemiparesis following cerebral infarction affecting right dominant side: Secondary | ICD-10-CM | POA: Diagnosis not present

## 2015-04-18 DIAGNOSIS — I120 Hypertensive chronic kidney disease with stage 5 chronic kidney disease or end stage renal disease: Secondary | ICD-10-CM | POA: Diagnosis not present

## 2015-04-19 DIAGNOSIS — I69351 Hemiplegia and hemiparesis following cerebral infarction affecting right dominant side: Secondary | ICD-10-CM | POA: Diagnosis not present

## 2015-04-19 DIAGNOSIS — I5042 Chronic combined systolic (congestive) and diastolic (congestive) heart failure: Secondary | ICD-10-CM | POA: Diagnosis not present

## 2015-04-19 DIAGNOSIS — N186 End stage renal disease: Secondary | ICD-10-CM | POA: Diagnosis not present

## 2015-04-19 DIAGNOSIS — I2510 Atherosclerotic heart disease of native coronary artery without angina pectoris: Secondary | ICD-10-CM | POA: Diagnosis not present

## 2015-04-19 DIAGNOSIS — I120 Hypertensive chronic kidney disease with stage 5 chronic kidney disease or end stage renal disease: Secondary | ICD-10-CM | POA: Diagnosis not present

## 2015-04-19 DIAGNOSIS — D631 Anemia in chronic kidney disease: Secondary | ICD-10-CM | POA: Diagnosis not present

## 2015-04-24 DIAGNOSIS — N186 End stage renal disease: Secondary | ICD-10-CM | POA: Diagnosis not present

## 2015-04-24 DIAGNOSIS — D631 Anemia in chronic kidney disease: Secondary | ICD-10-CM | POA: Diagnosis not present

## 2015-04-24 DIAGNOSIS — I2510 Atherosclerotic heart disease of native coronary artery without angina pectoris: Secondary | ICD-10-CM | POA: Diagnosis not present

## 2015-04-24 DIAGNOSIS — I69351 Hemiplegia and hemiparesis following cerebral infarction affecting right dominant side: Secondary | ICD-10-CM | POA: Diagnosis not present

## 2015-04-24 DIAGNOSIS — I120 Hypertensive chronic kidney disease with stage 5 chronic kidney disease or end stage renal disease: Secondary | ICD-10-CM | POA: Diagnosis not present

## 2015-04-24 DIAGNOSIS — I5042 Chronic combined systolic (congestive) and diastolic (congestive) heart failure: Secondary | ICD-10-CM | POA: Diagnosis not present

## 2015-04-25 ENCOUNTER — Telehealth: Payer: Self-pay | Admitting: *Deleted

## 2015-04-25 DIAGNOSIS — N186 End stage renal disease: Secondary | ICD-10-CM | POA: Diagnosis not present

## 2015-04-25 DIAGNOSIS — I120 Hypertensive chronic kidney disease with stage 5 chronic kidney disease or end stage renal disease: Secondary | ICD-10-CM | POA: Diagnosis not present

## 2015-04-25 DIAGNOSIS — D631 Anemia in chronic kidney disease: Secondary | ICD-10-CM | POA: Diagnosis not present

## 2015-04-25 DIAGNOSIS — I5042 Chronic combined systolic (congestive) and diastolic (congestive) heart failure: Secondary | ICD-10-CM | POA: Diagnosis not present

## 2015-04-25 DIAGNOSIS — I2510 Atherosclerotic heart disease of native coronary artery without angina pectoris: Secondary | ICD-10-CM | POA: Diagnosis not present

## 2015-04-25 DIAGNOSIS — I69351 Hemiplegia and hemiparesis following cerebral infarction affecting right dominant side: Secondary | ICD-10-CM | POA: Diagnosis not present

## 2015-04-25 NOTE — Telephone Encounter (Signed)
SIGNED ORDER FOR SN,OT,PT FAXED TO [facility].

## 2015-04-26 DIAGNOSIS — I120 Hypertensive chronic kidney disease with stage 5 chronic kidney disease or end stage renal disease: Secondary | ICD-10-CM | POA: Diagnosis not present

## 2015-04-26 DIAGNOSIS — I5042 Chronic combined systolic (congestive) and diastolic (congestive) heart failure: Secondary | ICD-10-CM | POA: Diagnosis not present

## 2015-04-26 DIAGNOSIS — I2510 Atherosclerotic heart disease of native coronary artery without angina pectoris: Secondary | ICD-10-CM | POA: Diagnosis not present

## 2015-04-26 DIAGNOSIS — N186 End stage renal disease: Secondary | ICD-10-CM | POA: Diagnosis not present

## 2015-04-26 DIAGNOSIS — D631 Anemia in chronic kidney disease: Secondary | ICD-10-CM | POA: Diagnosis not present

## 2015-04-26 DIAGNOSIS — I69351 Hemiplegia and hemiparesis following cerebral infarction affecting right dominant side: Secondary | ICD-10-CM | POA: Diagnosis not present

## 2015-05-02 DIAGNOSIS — N186 End stage renal disease: Secondary | ICD-10-CM | POA: Diagnosis not present

## 2015-05-02 DIAGNOSIS — D631 Anemia in chronic kidney disease: Secondary | ICD-10-CM | POA: Diagnosis not present

## 2015-05-02 DIAGNOSIS — I120 Hypertensive chronic kidney disease with stage 5 chronic kidney disease or end stage renal disease: Secondary | ICD-10-CM | POA: Diagnosis not present

## 2015-05-02 DIAGNOSIS — I2510 Atherosclerotic heart disease of native coronary artery without angina pectoris: Secondary | ICD-10-CM | POA: Diagnosis not present

## 2015-05-02 DIAGNOSIS — I69351 Hemiplegia and hemiparesis following cerebral infarction affecting right dominant side: Secondary | ICD-10-CM | POA: Diagnosis not present

## 2015-05-02 DIAGNOSIS — I5042 Chronic combined systolic (congestive) and diastolic (congestive) heart failure: Secondary | ICD-10-CM | POA: Diagnosis not present

## 2015-05-03 DIAGNOSIS — I5042 Chronic combined systolic (congestive) and diastolic (congestive) heart failure: Secondary | ICD-10-CM | POA: Diagnosis not present

## 2015-05-03 DIAGNOSIS — D631 Anemia in chronic kidney disease: Secondary | ICD-10-CM | POA: Diagnosis not present

## 2015-05-03 DIAGNOSIS — I120 Hypertensive chronic kidney disease with stage 5 chronic kidney disease or end stage renal disease: Secondary | ICD-10-CM | POA: Diagnosis not present

## 2015-05-03 DIAGNOSIS — N186 End stage renal disease: Secondary | ICD-10-CM | POA: Diagnosis not present

## 2015-05-03 DIAGNOSIS — I2510 Atherosclerotic heart disease of native coronary artery without angina pectoris: Secondary | ICD-10-CM | POA: Diagnosis not present

## 2015-05-03 DIAGNOSIS — I69351 Hemiplegia and hemiparesis following cerebral infarction affecting right dominant side: Secondary | ICD-10-CM | POA: Diagnosis not present

## 2015-05-04 DIAGNOSIS — I69351 Hemiplegia and hemiparesis following cerebral infarction affecting right dominant side: Secondary | ICD-10-CM | POA: Diagnosis not present

## 2015-05-04 DIAGNOSIS — I2510 Atherosclerotic heart disease of native coronary artery without angina pectoris: Secondary | ICD-10-CM | POA: Diagnosis not present

## 2015-05-04 DIAGNOSIS — N186 End stage renal disease: Secondary | ICD-10-CM | POA: Diagnosis not present

## 2015-05-04 DIAGNOSIS — I5042 Chronic combined systolic (congestive) and diastolic (congestive) heart failure: Secondary | ICD-10-CM | POA: Diagnosis not present

## 2015-05-04 DIAGNOSIS — D631 Anemia in chronic kidney disease: Secondary | ICD-10-CM | POA: Diagnosis not present

## 2015-05-04 DIAGNOSIS — I120 Hypertensive chronic kidney disease with stage 5 chronic kidney disease or end stage renal disease: Secondary | ICD-10-CM | POA: Diagnosis not present

## 2015-05-08 DIAGNOSIS — D631 Anemia in chronic kidney disease: Secondary | ICD-10-CM | POA: Diagnosis not present

## 2015-05-08 DIAGNOSIS — I5042 Chronic combined systolic (congestive) and diastolic (congestive) heart failure: Secondary | ICD-10-CM | POA: Diagnosis not present

## 2015-05-08 DIAGNOSIS — I69351 Hemiplegia and hemiparesis following cerebral infarction affecting right dominant side: Secondary | ICD-10-CM | POA: Diagnosis not present

## 2015-05-08 DIAGNOSIS — I120 Hypertensive chronic kidney disease with stage 5 chronic kidney disease or end stage renal disease: Secondary | ICD-10-CM | POA: Diagnosis not present

## 2015-05-08 DIAGNOSIS — I2510 Atherosclerotic heart disease of native coronary artery without angina pectoris: Secondary | ICD-10-CM | POA: Diagnosis not present

## 2015-05-08 DIAGNOSIS — N186 End stage renal disease: Secondary | ICD-10-CM | POA: Diagnosis not present

## 2015-05-09 ENCOUNTER — Telehealth: Payer: Self-pay | Admitting: Cardiology

## 2015-05-09 DIAGNOSIS — I69351 Hemiplegia and hemiparesis following cerebral infarction affecting right dominant side: Secondary | ICD-10-CM | POA: Diagnosis not present

## 2015-05-09 DIAGNOSIS — D631 Anemia in chronic kidney disease: Secondary | ICD-10-CM | POA: Diagnosis not present

## 2015-05-09 DIAGNOSIS — N186 End stage renal disease: Secondary | ICD-10-CM | POA: Diagnosis not present

## 2015-05-09 DIAGNOSIS — I5042 Chronic combined systolic (congestive) and diastolic (congestive) heart failure: Secondary | ICD-10-CM | POA: Diagnosis not present

## 2015-05-09 DIAGNOSIS — I120 Hypertensive chronic kidney disease with stage 5 chronic kidney disease or end stage renal disease: Secondary | ICD-10-CM | POA: Diagnosis not present

## 2015-05-09 DIAGNOSIS — I2510 Atherosclerotic heart disease of native coronary artery without angina pectoris: Secondary | ICD-10-CM | POA: Diagnosis not present

## 2015-05-09 NOTE — Telephone Encounter (Signed)
New message         Trevor [name] is requesting a verbal order to continue speech therapy for 2 more weeks

## 2015-05-09 NOTE — Telephone Encounter (Signed)
V/O to continue speech therapy given. Alonna Minium, speech pathologist with [facility] will fax over order for MD to sign

## 2015-05-10 DIAGNOSIS — I2510 Atherosclerotic heart disease of native coronary artery without angina pectoris: Secondary | ICD-10-CM | POA: Diagnosis not present

## 2015-05-10 DIAGNOSIS — I120 Hypertensive chronic kidney disease with stage 5 chronic kidney disease or end stage renal disease: Secondary | ICD-10-CM | POA: Diagnosis not present

## 2015-05-10 DIAGNOSIS — I5042 Chronic combined systolic (congestive) and diastolic (congestive) heart failure: Secondary | ICD-10-CM | POA: Diagnosis not present

## 2015-05-10 DIAGNOSIS — D631 Anemia in chronic kidney disease: Secondary | ICD-10-CM | POA: Diagnosis not present

## 2015-05-10 DIAGNOSIS — N186 End stage renal disease: Secondary | ICD-10-CM | POA: Diagnosis not present

## 2015-05-10 DIAGNOSIS — I69351 Hemiplegia and hemiparesis following cerebral infarction affecting right dominant side: Secondary | ICD-10-CM | POA: Diagnosis not present

## 2015-05-15 DIAGNOSIS — N186 End stage renal disease: Secondary | ICD-10-CM | POA: Diagnosis not present

## 2015-05-15 DIAGNOSIS — I2510 Atherosclerotic heart disease of native coronary artery without angina pectoris: Secondary | ICD-10-CM | POA: Diagnosis not present

## 2015-05-15 DIAGNOSIS — I5042 Chronic combined systolic (congestive) and diastolic (congestive) heart failure: Secondary | ICD-10-CM | POA: Diagnosis not present

## 2015-05-15 DIAGNOSIS — I120 Hypertensive chronic kidney disease with stage 5 chronic kidney disease or end stage renal disease: Secondary | ICD-10-CM | POA: Diagnosis not present

## 2015-05-15 DIAGNOSIS — I69351 Hemiplegia and hemiparesis following cerebral infarction affecting right dominant side: Secondary | ICD-10-CM | POA: Diagnosis not present

## 2015-05-15 DIAGNOSIS — D631 Anemia in chronic kidney disease: Secondary | ICD-10-CM | POA: Diagnosis not present

## 2015-05-17 DIAGNOSIS — I5042 Chronic combined systolic (congestive) and diastolic (congestive) heart failure: Secondary | ICD-10-CM | POA: Diagnosis not present

## 2015-05-17 DIAGNOSIS — D631 Anemia in chronic kidney disease: Secondary | ICD-10-CM | POA: Diagnosis not present

## 2015-05-17 DIAGNOSIS — I2510 Atherosclerotic heart disease of native coronary artery without angina pectoris: Secondary | ICD-10-CM | POA: Diagnosis not present

## 2015-05-17 DIAGNOSIS — N186 End stage renal disease: Secondary | ICD-10-CM | POA: Diagnosis not present

## 2015-05-17 DIAGNOSIS — I69351 Hemiplegia and hemiparesis following cerebral infarction affecting right dominant side: Secondary | ICD-10-CM | POA: Diagnosis not present

## 2015-05-17 DIAGNOSIS — I120 Hypertensive chronic kidney disease with stage 5 chronic kidney disease or end stage renal disease: Secondary | ICD-10-CM | POA: Diagnosis not present

## 2015-05-18 DIAGNOSIS — N186 End stage renal disease: Secondary | ICD-10-CM | POA: Diagnosis not present

## 2015-05-18 DIAGNOSIS — I120 Hypertensive chronic kidney disease with stage 5 chronic kidney disease or end stage renal disease: Secondary | ICD-10-CM | POA: Diagnosis not present

## 2015-05-18 DIAGNOSIS — I2510 Atherosclerotic heart disease of native coronary artery without angina pectoris: Secondary | ICD-10-CM | POA: Diagnosis not present

## 2015-05-18 DIAGNOSIS — D631 Anemia in chronic kidney disease: Secondary | ICD-10-CM | POA: Diagnosis not present

## 2015-05-18 DIAGNOSIS — I69351 Hemiplegia and hemiparesis following cerebral infarction affecting right dominant side: Secondary | ICD-10-CM | POA: Diagnosis not present

## 2015-05-18 DIAGNOSIS — I5042 Chronic combined systolic (congestive) and diastolic (congestive) heart failure: Secondary | ICD-10-CM | POA: Diagnosis not present

## 2015-05-22 DIAGNOSIS — I69351 Hemiplegia and hemiparesis following cerebral infarction affecting right dominant side: Secondary | ICD-10-CM | POA: Diagnosis not present

## 2015-05-22 DIAGNOSIS — N186 End stage renal disease: Secondary | ICD-10-CM | POA: Diagnosis not present

## 2015-05-22 DIAGNOSIS — D631 Anemia in chronic kidney disease: Secondary | ICD-10-CM | POA: Diagnosis not present

## 2015-05-22 DIAGNOSIS — I2510 Atherosclerotic heart disease of native coronary artery without angina pectoris: Secondary | ICD-10-CM | POA: Diagnosis not present

## 2015-05-22 DIAGNOSIS — I5042 Chronic combined systolic (congestive) and diastolic (congestive) heart failure: Secondary | ICD-10-CM | POA: Diagnosis not present

## 2015-05-22 DIAGNOSIS — I120 Hypertensive chronic kidney disease with stage 5 chronic kidney disease or end stage renal disease: Secondary | ICD-10-CM | POA: Diagnosis not present

## 2015-05-23 DIAGNOSIS — I5042 Chronic combined systolic (congestive) and diastolic (congestive) heart failure: Secondary | ICD-10-CM | POA: Diagnosis not present

## 2015-05-23 DIAGNOSIS — N186 End stage renal disease: Secondary | ICD-10-CM | POA: Diagnosis not present

## 2015-05-23 DIAGNOSIS — D631 Anemia in chronic kidney disease: Secondary | ICD-10-CM | POA: Diagnosis not present

## 2015-05-23 DIAGNOSIS — I120 Hypertensive chronic kidney disease with stage 5 chronic kidney disease or end stage renal disease: Secondary | ICD-10-CM | POA: Diagnosis not present

## 2015-05-23 DIAGNOSIS — I69351 Hemiplegia and hemiparesis following cerebral infarction affecting right dominant side: Secondary | ICD-10-CM | POA: Diagnosis not present

## 2015-05-23 DIAGNOSIS — I2510 Atherosclerotic heart disease of native coronary artery without angina pectoris: Secondary | ICD-10-CM | POA: Diagnosis not present

## 2015-05-24 DIAGNOSIS — I69351 Hemiplegia and hemiparesis following cerebral infarction affecting right dominant side: Secondary | ICD-10-CM | POA: Diagnosis not present

## 2015-05-24 DIAGNOSIS — I2510 Atherosclerotic heart disease of native coronary artery without angina pectoris: Secondary | ICD-10-CM | POA: Diagnosis not present

## 2015-05-24 DIAGNOSIS — I5042 Chronic combined systolic (congestive) and diastolic (congestive) heart failure: Secondary | ICD-10-CM | POA: Diagnosis not present

## 2015-05-24 DIAGNOSIS — D631 Anemia in chronic kidney disease: Secondary | ICD-10-CM | POA: Diagnosis not present

## 2015-05-24 DIAGNOSIS — N186 End stage renal disease: Secondary | ICD-10-CM | POA: Diagnosis not present

## 2015-05-24 DIAGNOSIS — I120 Hypertensive chronic kidney disease with stage 5 chronic kidney disease or end stage renal disease: Secondary | ICD-10-CM | POA: Diagnosis not present

## 2015-05-30 DIAGNOSIS — I5042 Chronic combined systolic (congestive) and diastolic (congestive) heart failure: Secondary | ICD-10-CM | POA: Diagnosis not present

## 2015-05-30 DIAGNOSIS — I2510 Atherosclerotic heart disease of native coronary artery without angina pectoris: Secondary | ICD-10-CM | POA: Diagnosis not present

## 2015-05-30 DIAGNOSIS — D631 Anemia in chronic kidney disease: Secondary | ICD-10-CM | POA: Diagnosis not present

## 2015-05-30 DIAGNOSIS — I69351 Hemiplegia and hemiparesis following cerebral infarction affecting right dominant side: Secondary | ICD-10-CM | POA: Diagnosis not present

## 2015-05-30 DIAGNOSIS — N186 End stage renal disease: Secondary | ICD-10-CM | POA: Diagnosis not present

## 2015-05-30 DIAGNOSIS — I120 Hypertensive chronic kidney disease with stage 5 chronic kidney disease or end stage renal disease: Secondary | ICD-10-CM | POA: Diagnosis not present

## 2015-06-07 DIAGNOSIS — I2510 Atherosclerotic heart disease of native coronary artery without angina pectoris: Secondary | ICD-10-CM | POA: Diagnosis not present

## 2015-06-07 DIAGNOSIS — I5042 Chronic combined systolic (congestive) and diastolic (congestive) heart failure: Secondary | ICD-10-CM | POA: Diagnosis not present

## 2015-06-07 DIAGNOSIS — I120 Hypertensive chronic kidney disease with stage 5 chronic kidney disease or end stage renal disease: Secondary | ICD-10-CM | POA: Diagnosis not present

## 2015-06-07 DIAGNOSIS — N186 End stage renal disease: Secondary | ICD-10-CM | POA: Diagnosis not present

## 2015-06-07 DIAGNOSIS — I69351 Hemiplegia and hemiparesis following cerebral infarction affecting right dominant side: Secondary | ICD-10-CM | POA: Diagnosis not present

## 2015-06-07 DIAGNOSIS — D631 Anemia in chronic kidney disease: Secondary | ICD-10-CM | POA: Diagnosis not present

## 2015-06-21 DIAGNOSIS — N186 End stage renal disease: Secondary | ICD-10-CM | POA: Diagnosis not present

## 2015-06-21 DIAGNOSIS — I69351 Hemiplegia and hemiparesis following cerebral infarction affecting right dominant side: Secondary | ICD-10-CM | POA: Diagnosis not present

## 2015-06-21 DIAGNOSIS — H548 Legal blindness, as defined in USA: Secondary | ICD-10-CM | POA: Diagnosis not present

## 2015-06-21 DIAGNOSIS — I252 Old myocardial infarction: Secondary | ICD-10-CM | POA: Diagnosis not present

## 2015-06-21 DIAGNOSIS — I120 Hypertensive chronic kidney disease with stage 5 chronic kidney disease or end stage renal disease: Secondary | ICD-10-CM | POA: Diagnosis not present

## 2015-06-21 DIAGNOSIS — G629 Polyneuropathy, unspecified: Secondary | ICD-10-CM | POA: Diagnosis not present

## 2015-06-28 DIAGNOSIS — H548 Legal blindness, as defined in USA: Secondary | ICD-10-CM | POA: Diagnosis not present

## 2015-06-28 DIAGNOSIS — I69351 Hemiplegia and hemiparesis following cerebral infarction affecting right dominant side: Secondary | ICD-10-CM | POA: Diagnosis not present

## 2015-06-28 DIAGNOSIS — I252 Old myocardial infarction: Secondary | ICD-10-CM | POA: Diagnosis not present

## 2015-06-28 DIAGNOSIS — G629 Polyneuropathy, unspecified: Secondary | ICD-10-CM | POA: Diagnosis not present

## 2015-06-28 DIAGNOSIS — I120 Hypertensive chronic kidney disease with stage 5 chronic kidney disease or end stage renal disease: Secondary | ICD-10-CM | POA: Diagnosis not present

## 2015-06-28 DIAGNOSIS — N186 End stage renal disease: Secondary | ICD-10-CM | POA: Diagnosis not present

## 2015-07-05 DIAGNOSIS — I120 Hypertensive chronic kidney disease with stage 5 chronic kidney disease or end stage renal disease: Secondary | ICD-10-CM | POA: Diagnosis not present

## 2015-07-05 DIAGNOSIS — G629 Polyneuropathy, unspecified: Secondary | ICD-10-CM | POA: Diagnosis not present

## 2015-07-05 DIAGNOSIS — N186 End stage renal disease: Secondary | ICD-10-CM | POA: Diagnosis not present

## 2015-07-05 DIAGNOSIS — H548 Legal blindness, as defined in USA: Secondary | ICD-10-CM | POA: Diagnosis not present

## 2015-07-05 DIAGNOSIS — I252 Old myocardial infarction: Secondary | ICD-10-CM | POA: Diagnosis not present

## 2015-07-05 DIAGNOSIS — I69351 Hemiplegia and hemiparesis following cerebral infarction affecting right dominant side: Secondary | ICD-10-CM | POA: Diagnosis not present

## 2015-07-09 ENCOUNTER — Other Ambulatory Visit: Payer: Self-pay | Admitting: *Deleted

## 2015-07-09 DIAGNOSIS — I70219 Atherosclerosis of native arteries of extremities with intermittent claudication, unspecified extremity: Secondary | ICD-10-CM

## 2015-07-17 DIAGNOSIS — N186 End stage renal disease: Secondary | ICD-10-CM | POA: Diagnosis not present

## 2015-07-17 DIAGNOSIS — I69351 Hemiplegia and hemiparesis following cerebral infarction affecting right dominant side: Secondary | ICD-10-CM | POA: Diagnosis not present

## 2015-07-17 DIAGNOSIS — I252 Old myocardial infarction: Secondary | ICD-10-CM | POA: Diagnosis not present

## 2015-07-17 DIAGNOSIS — H548 Legal blindness, as defined in USA: Secondary | ICD-10-CM | POA: Diagnosis not present

## 2015-07-17 DIAGNOSIS — I120 Hypertensive chronic kidney disease with stage 5 chronic kidney disease or end stage renal disease: Secondary | ICD-10-CM | POA: Diagnosis not present

## 2015-07-17 DIAGNOSIS — G629 Polyneuropathy, unspecified: Secondary | ICD-10-CM | POA: Diagnosis not present

## 2015-07-19 DIAGNOSIS — I120 Hypertensive chronic kidney disease with stage 5 chronic kidney disease or end stage renal disease: Secondary | ICD-10-CM | POA: Diagnosis not present

## 2015-07-19 DIAGNOSIS — I69351 Hemiplegia and hemiparesis following cerebral infarction affecting right dominant side: Secondary | ICD-10-CM | POA: Diagnosis not present

## 2015-07-19 DIAGNOSIS — N186 End stage renal disease: Secondary | ICD-10-CM | POA: Diagnosis not present

## 2015-07-19 DIAGNOSIS — H548 Legal blindness, as defined in USA: Secondary | ICD-10-CM | POA: Diagnosis not present

## 2015-07-19 DIAGNOSIS — I252 Old myocardial infarction: Secondary | ICD-10-CM | POA: Diagnosis not present

## 2015-07-19 DIAGNOSIS — G629 Polyneuropathy, unspecified: Secondary | ICD-10-CM | POA: Diagnosis not present

## 2015-07-30 ENCOUNTER — Emergency Department (HOSPITAL_COMMUNITY): Payer: Medicare Other

## 2015-07-30 ENCOUNTER — Encounter (HOSPITAL_COMMUNITY): Payer: Self-pay

## 2015-07-30 ENCOUNTER — Emergency Department (HOSPITAL_COMMUNITY)
Admission: EM | Admit: 2015-07-30 | Discharge: 2015-07-30 | Disposition: A | Discharge status: Home / Self Care | Payer: Medicare Other | Attending: Emergency Medicine | Admitting: Emergency Medicine

## 2015-07-30 DIAGNOSIS — I252 Old myocardial infarction: Secondary | ICD-10-CM | POA: Insufficient documentation

## 2015-07-30 DIAGNOSIS — R5383 Other fatigue: Secondary | ICD-10-CM | POA: Diagnosis present

## 2015-07-30 DIAGNOSIS — H540 Blindness, both eyes: Secondary | ICD-10-CM | POA: Insufficient documentation

## 2015-07-30 DIAGNOSIS — Z8673 Personal history of transient ischemic attack (TIA), and cerebral infarction without residual deficits: Secondary | ICD-10-CM | POA: Insufficient documentation

## 2015-07-30 DIAGNOSIS — I120 Hypertensive chronic kidney disease with stage 5 chronic kidney disease or end stage renal disease: Secondary | ICD-10-CM | POA: Diagnosis not present

## 2015-07-30 DIAGNOSIS — F419 Anxiety disorder, unspecified: Secondary | ICD-10-CM | POA: Diagnosis not present

## 2015-07-30 DIAGNOSIS — Z8739 Personal history of other diseases of the musculoskeletal system and connective tissue: Secondary | ICD-10-CM | POA: Diagnosis not present

## 2015-07-30 DIAGNOSIS — Z79899 Other long term (current) drug therapy: Secondary | ICD-10-CM | POA: Diagnosis not present

## 2015-07-30 DIAGNOSIS — Z992 Dependence on renal dialysis: Secondary | ICD-10-CM | POA: Insufficient documentation

## 2015-07-30 DIAGNOSIS — Z720 Tobacco use: Secondary | ICD-10-CM | POA: Diagnosis not present

## 2015-07-30 DIAGNOSIS — R0989 Other specified symptoms and signs involving the circulatory and respiratory systems: Secondary | ICD-10-CM | POA: Diagnosis not present

## 2015-07-30 DIAGNOSIS — R0602 Shortness of breath: Secondary | ICD-10-CM | POA: Diagnosis not present

## 2015-07-30 DIAGNOSIS — Z7902 Long term (current) use of antithrombotics/antiplatelets: Secondary | ICD-10-CM | POA: Insufficient documentation

## 2015-07-30 DIAGNOSIS — N186 End stage renal disease: Secondary | ICD-10-CM | POA: Diagnosis not present

## 2015-07-30 LAB — CBC WITH DIFFERENTIAL/PLATELET
Basophils Absolute: 0.1 10*3/uL (ref 0.0–0.1)
Basophils Relative: 1 % (ref 0–1)
EOS PCT: 1 % (ref 0–5)
Eosinophils Absolute: 0.1 10*3/uL (ref 0.0–0.7)
HCT: 33.1 % — ABNORMAL LOW (ref 39.0–52.0)
Hemoglobin: 11.2 g/dL — ABNORMAL LOW (ref 13.0–17.0)
LYMPHS ABS: 0.8 10*3/uL (ref 0.7–4.0)
LYMPHS PCT: 10 % — AB (ref 12–46)
MCH: 29.7 pg (ref 26.0–34.0)
MCHC: 33.8 g/dL (ref 30.0–36.0)
MCV: 87.8 fL (ref 78.0–100.0)
MONO ABS: 0.5 10*3/uL (ref 0.1–1.0)
MONOS PCT: 6 % (ref 3–12)
Neutro Abs: 6.5 10*3/uL (ref 1.7–7.7)
Neutrophils Relative %: 82 % — ABNORMAL HIGH (ref 43–77)
PLATELETS: 170 10*3/uL (ref 150–400)
RBC: 3.77 MIL/uL — ABNORMAL LOW (ref 4.22–5.81)
RDW: 14 % (ref 11.5–15.5)
WBC: 7.9 10*3/uL (ref 4.0–10.5)

## 2015-07-30 LAB — I-STAT CHEM 8, ED
BUN: 56 mg/dL — ABNORMAL HIGH (ref 6–20)
CREATININE: 12.2 mg/dL — AB (ref 0.61–1.24)
Calcium, Ion: 1.1 mmol/L — ABNORMAL LOW (ref 1.13–1.30)
Chloride: 109 mmol/L (ref 101–111)
GLUCOSE: 85 mg/dL (ref 65–99)
HCT: 33 % — ABNORMAL LOW (ref 39.0–52.0)
Hemoglobin: 11.2 g/dL — ABNORMAL LOW (ref 13.0–17.0)
POTASSIUM: 5.4 mmol/L — AB (ref 3.5–5.1)
Sodium: 140 mmol/L (ref 135–145)
TCO2: 19 mmol/L (ref 0–100)

## 2015-07-30 LAB — BASIC METABOLIC PANEL
Anion gap: 17 — ABNORMAL HIGH (ref 5–15)
BUN: 50 mg/dL — AB (ref 6–20)
CO2: 19 mmol/L — ABNORMAL LOW (ref 22–32)
CREATININE: 12.09 mg/dL — AB (ref 0.61–1.24)
Calcium: 9.5 mg/dL (ref 8.9–10.3)
Chloride: 102 mmol/L (ref 101–111)
GFR calc Af Amer: 4 mL/min — ABNORMAL LOW (ref >60)
GFR, EST NON AFRICAN AMERICAN: 4 mL/min — AB (ref >60)
GLUCOSE: 88 mg/dL (ref 65–99)
Potassium: 5.6 mmol/L — ABNORMAL HIGH (ref 3.5–5.1)
Sodium: 138 mmol/L (ref 135–145)

## 2015-07-30 LAB — TROPONIN I: Troponin I: 0.03 ng/mL (ref <0.031)

## 2015-07-30 MED ORDER — METOPROLOL TARTRATE 25 MG PO TABS
12.5000 mg | ORAL_TABLET | Freq: Once | ORAL | Status: AC
  Administered 2015-07-30: 12.5 mg via ORAL
  Filled 2015-07-30: qty 1

## 2015-07-30 MED ORDER — HYDRALAZINE HCL 20 MG/ML IJ SOLN
10.0000 mg | INTRAMUSCULAR | Status: AC
  Administered 2015-07-30: 10 mg via INTRAVENOUS
  Filled 2015-07-30: qty 1

## 2015-07-30 NOTE — ED Notes (Signed)
Per [facility]: pt has been having intermittent shortness of breath and fatigue. Pt was scheduled to go to dialysis Saturday and did not go. Pt was scheduled to go to dialysis today. Pt has also not been sleeping. Pt was at his brothers funeral two days ago. Pt is currently on 2 L [place]. 02 sat is 94% on room air.

## 2015-07-30 NOTE — Discharge Instructions (Signed)
Please go to your dialysis appointment today at noon. Please read all discharge instructions and return precautions.    Dialysis Dialysis is a procedure that replaces some of the work healthy kidneys do. It is done when you lose about 85-90% of your kidney function. It may also be done earlier if your symptoms may be improved by dialysis. During dialysis, wastes, salt, and extra water are removed from the blood, and the levels of certain chemicals in the blood (such as potassium) are maintained. Dialysis is done in sessions. Dialysis sessions are continued until the kidneys get better. If the kidneys cannot get better, such as in end-stage kidney disease, dialysis is continued for life or until you receive a new kidney (kidney transplant). There are two types of dialysis: hemodialysis and peritoneal dialysis. WHAT IS HEMODIALYSIS?  Hemodialysis is a type of dialysis in which a machine called a dialyzer is used to filter the blood. Before beginning hemodialysis, you will have surgery to create a site where blood can be removed from the body and returned to the body (vascular access). There are three types of vascular accesses:  Arteriovenous fistula. To create this type of access, an artery is connected to a vein (usually in the arm). A fistula takes 1-6 months to develop after surgery. If it develops properly, it usually lasts longer than the other types of vascular accesses. It is also less likely to become infected and cause blood clots.  Arteriovenous graft. To create this type of access, an artery and a vein in the arm are connected with a tube. A graft may be used within 2-3 weeks of surgery.  A venous catheter. To create this type of access, a thin, flexible tube (catheter) is placed in a large vein in your neck, chest, or groin. A catheter may be used right away. It is usually used as a temporary access when dialysis needs to begin immediately. During hemodialysis, blood leaves the body through  your access. It travels through a tube to the dialyzer, where it is filtered. The blood then returns to your body through another tube. Hemodialysis is usually performed by a health care provider at a hospital or dialysis center three times a week. Visits last about 3-4 hours. It may also be performed with the help of another person at home with training.  WHAT IS PERITONEAL DIALYSIS? Peritoneal dialysis is a type of dialysis in which the thin lining of the abdomen (peritoneum) is used as a filter. Before beginning peritoneal dialysis, you will have surgery to place a catheter in your abdomen. The catheter will be used to transfer a fluid called dialysate to and from your abdomen. At the start of a session, your abdomen is filled with dialysate. During the session, wastes, salt, and extra water in the blood pass through the peritoneum and into the dialysate. The dialysate is drained from the body at the end of the session. The process of filling and draining the dialysate is called an exchange. Exchanges are repeated until you have used up all the dialysate for the day. Peritoneal dialysis may be performed by you at home or at almost any other location. It is done every day. You may need up to five exchanges a day. The amount of time the dialysate is in your body between exchanges is called a dwell. The dwell depends on the number of exchanges needed and the characteristics of the peritoneum. It usually varies from 1.5-3 hours. You may go about your day normally between  exchanges. Alternately, the exchanges may be done at night while you sleep, using a machine called a cycler. WHICH TYPE OF DIALYSIS SHOULD I CHOOSE?  Both hemodialysis and peritoneal dialysis have advantages and disadvantages. Talk to your health care provider about which type of dialysis would be best for you. Your lifestyle and preferences should be considered along with your medical condition. In some cases, only one type of dialysis may be  an option.  Advantages of hemodialysis  It is done less often than peritoneal dialysis.  Someone else can do the dialysis for you.  If you go to a dialysis center, your health care provider will be able to recognize any problems right away.  If you go to a dialysis center, you can interact with others who are having dialysis. This can provide you with emotional support. Disadvantages of hemodialysis  Hemodialysis may cause cramps and low blood pressure. It may leave you feeling tired on the days you have the treatment.  If you go to a dialysis center, you will need to make weekly appointments and work around the center's schedule.  You will need to take extra care when traveling. If you go to a dialysis center, you will need to make special arrangements to visit a dialysis center near your destination. If you are having treatments at home, you will need to take the dialyzer with you to your destination.  You will need to avoid more foods than you would need to avoid on peritoneal dialysis. Advantages of peritoneal dialysis  It is less likely than hemodialysis to cause cramps and low blood pressure.  You may do exchanges on your own wherever you are, including when you travel.  You do not need to avoid as many foods as you do on hemodialysis. Disadvantages of peritoneal dialysis  It is done more often than hemodialysis.  Performing peritoneal dialysis requires you to have dexterity of the hands. You must also be able to lift bags.  You will have to learn sterilization techniques. You will need to practice them every day to reduce the risk of infection. WHAT CHANGES WILL I NEED TO MAKE TO MY DIET DURING DIALYSIS? Both hemodialysis and peritoneal dialysis require you to make some changes to your diet. For example, you will need to limit your intake of foods high in the minerals phosphorus and potassium. You will also need to limit your fluid intake. Your dietitian can help you plan  meals. A good meal plan can improve your dialysis and your health.  WHAT SHOULD I EXPECT WHEN BEGINNING DIALYSIS? Adjusting to the dialysis treatment, schedule, and diet can take some time. You may need to stop working and may not be able to do some of the things you normally do. You may feel anxious or depressed when beginning dialysis. Eventually, many people feel better overall because of dialysis. Some people are able to return to work after making some changes, such as reducing work intensity. WHERE CAN I FIND MORE INFORMATION?   [place]: www.kidney.org  American Association of Kidney Patients: BombTimer.gl  [employer]: www.kidneyfund.org Document Released: 02/07/2003 Document Revised: 04/03/2014 Document Reviewed: 01/11/2013 [facility] Patient Information 2015 [place], [place]. This information is not intended to replace advice given to you by your health care provider. Make sure you discuss any questions you have with your health care provider. Shortness of Breath Shortness of breath means you have trouble breathing. It could also mean that you have a medical problem. You should get immediate medical  care for shortness of breath. CAUSES   Not enough oxygen in the air such as with high altitudes or a smoke-filled room.  Certain lung diseases, infections, or problems.  Heart disease or conditions, such as angina or heart failure.  Low red blood cells (anemia).  Poor physical fitness, which can cause shortness of breath when you exercise.  Chest or back injuries or stiffness.  Being overweight.  Smoking.  Anxiety, which can make you feel like you are not getting enough air. DIAGNOSIS  Serious medical problems can often be found during your physical exam. Tests may also be done to determine why you are having shortness of breath. Tests may include:  Chest X-rays.  Lung function tests.  Blood tests.  An electrocardiogram (ECG).  An ambulatory  electrocardiogram. An ambulatory ECG records your heartbeat patterns over a 24-hour period.  Exercise testing.  A transthoracic echocardiogram (TTE). During echocardiography, sound waves are used to evaluate how blood flows through your heart.  A transesophageal echocardiogram (TEE).  Imaging scans. Your health care provider may not be able to find a cause for your shortness of breath after your exam. In this case, it is important to have a follow-up exam with your health care provider as directed.  TREATMENT  Treatment for shortness of breath depends on the cause of your symptoms and can vary greatly. HOME CARE INSTRUCTIONS   Do not smoke. Smoking is a common cause of shortness of breath. If you smoke, ask for help to quit.  Avoid being around chemicals or things that may bother your breathing, such as paint fumes and dust.  Rest as needed. Slowly resume your usual activities.  If medicines were prescribed, take them as directed for the full length of time directed. This includes oxygen and any inhaled medicines.  Keep all follow-up appointments as directed by your health care provider. SEEK MEDICAL CARE IF:   Your condition does not improve in the time expected.  You have a hard time doing your normal activities even with rest.  You have any new symptoms. SEEK IMMEDIATE MEDICAL CARE IF:   Your shortness of breath gets worse.  You feel light-headed, faint, or develop a cough not controlled with medicines.  You start coughing up blood.  You have pain with breathing.  You have chest pain or pain in your arms, shoulders, or abdomen.  You have a fever.  You are unable to walk up stairs or exercise the way you normally do. MAKE SURE YOU:  Understand these instructions.  Will watch your condition.  Will get help right away if you are not doing well or get worse. Document Released: 08/12/2001 Document Revised: 11/22/2013 Document Reviewed: 02/02/2012 [facility] Patient  Information 2015 [place], [place]. This information is not intended to replace advice given to you by your health care provider. Make sure you discuss any questions you have with your health care provider.

## 2015-07-30 NOTE — ED Provider Notes (Signed)
CSN: [id number]     Arrival date & time 07/30/15  [id number] History   First MD Initiated Contact with Patient 07/30/15 0602     Chief Complaint  Patient presents with  . Shortness of Breath  . Fatigue     (Consider location/radiation/quality/duration/timing/severity/associated sxs/prior Treatment) HPI Comments: The patient is a 69 yo M PMHx significant for HTN, Anxiety, ESRD on dialysis TThS, CVA, MI presenting to the ED for evaluation of worsening SOB since Thursday. Patient states he has had a very busy few weeks and believes he may have overdone it at his brother's funeral. His last dialysis was Wednesday, he skipped dialysis Saturday due to fatigue. He endorses cough, rhinorrhea, congestion since yesterday. Denies any fevers, vomiting, or diarrhea. Denies any chest pain but does report he "occasionally has chest pain." He is scheduled for make up dialysis today at noon. Did not take his home medications yesterday or today.   Patient is a 69 y.o. male presenting with shortness of breath.  Shortness of Breath   Past Medical History  Diagnosis Date  . HTN (hypertension)   . Anxiety   . Cardiomyopathy   . Renal insufficiency   . Myocardial infarction   . CVA (cerebral infarction)   . Chest pain   . Shortness of breath   . Stroke   . Headache(784.0)   . Arthritis   . Blindness    Past Surgical History  Procedure Laterality Date  . Tee without cardioversion  10/09/2011    Procedure: TRANSESOPHAGEAL ECHOCARDIOGRAM (TEE);  Surgeon: Lelon Perla, MD;  Location[facility] ENDOSCOPY;  Service: Cardiovascular;  Laterality: N/A;  . Right hand    . Insertion of dialysis catheter  09/21/2012    Procedure: INSERTION OF DIALYSIS CATHETER;  Surgeon: Angelia Mould, MD;  Location: [facility];  Service: Vascular;  Laterality: N/A;  Right Internal Jugular Placement  . Left heart catheterization with coronary angiogram N/A 09/28/2012    Procedure: LEFT HEART CATHETERIZATION WITH CORONARY ANGIOGRAM;   Surgeon: Sherren Mocha, MD;  Location[facility] CATH LAB;  Service: Cardiovascular;  Laterality: N/A;  . Left heart catheterization with coronary angiogram N/A 07/07/2014    Procedure: LEFT HEART CATHETERIZATION WITH CORONARY ANGIOGRAM;  Surgeon: Leonie Man, MD;  Location: [facility] CATH LAB;  Service: Cardiovascular;  Laterality: N/A;   Family History  Problem Relation Age of Onset  . Heart attack Mother     MI in her 33s  . Anesthesia problems Neg Hx   . Hypotension Neg Hx   . Malignant hyperthermia Neg Hx   . Pseudochol deficiency Neg Hx    Social History  Substance Use Topics  . Smoking status: Current Every Day Smoker -- 0.50 packs/day for 50 years    Types: Cigarettes  . Smokeless tobacco: Never Used  . Alcohol Use: No     Comment: Occasional    Review of Systems  Respiratory: Positive for shortness of breath.   All other systems reviewed and are negative.     Allergies  Review of patient's allergies indicates no known allergies.  Home Medications   Prior to Admission medications   Medication Sig Start Date End Date Taking? Authorizing Provider  calcium acetate (PHOSLO) 667 MG capsule Take 667 mg by mouth 3 (three) times daily with meals.   Yes Historical Provider, MD  cloNIDine (CATAPRES) 0.1 MG tablet Take 0.1 mg by mouth 2 (two) times daily. 03/28/15  Yes Historical Provider, MD  clopidogrel (PLAVIX) 75 MG tablet Take 1 tablet (75  mg total) by mouth daily. 04/05/15  Yes Barton Dubois, MD  finasteride (PROSCAR) 5 MG tablet Take 5 mg by mouth daily. 03/28/15  Yes Historical Provider, MD  gabapentin (NEURONTIN) 100 MG capsule Take 1 capsule (100 mg total) by mouth 3 (three) times daily. 04/05/15  Yes Barton Dubois, MD  hydrALAZINE (APRESOLINE) 50 MG tablet Take 1 tablet (50 mg total) by mouth 3 (three) times daily. 04/05/15  Yes Barton Dubois, MD  metoprolol tartrate (LOPRESSOR) 25 MG tablet Take 0.5 tablets (12.5 mg total) by mouth 2 (two) times daily. 04/05/15  Yes Barton Dubois, MD   multivitamin (RENA-VIT) TABS tablet Take 1 tablet by mouth daily. 03/28/15  Yes Historical Provider, MD  NIFEdipine (PROCARDIA XL/ADALAT-CC) 90 MG 24 hr tablet Take 1 tablet (90 mg total) by mouth daily. 04/05/15  Yes Barton Dubois, MD  omega-3 acid ethyl esters (LOVAZA) 1 G capsule Take 1 capsule (1 g total) by mouth 2 (two) times daily. 04/05/15  Yes Barton Dubois, MD  OVER THE COUNTER MEDICATION Apply 1 application topically daily as needed (for shoulder pain).   Yes Historical Provider, MD  pravastatin (PRAVACHOL) 40 MG tablet Take 1 tablet (40 mg total) by mouth every evening. 03/06/15  Yes Lelon Perla, MD  tamsulosin (FLOMAX) 0.4 MG CAPS capsule Take 0.4 mg by mouth 2 (two) times daily. 03/28/15  Yes Historical Provider, MD  terazosin (HYTRIN) 2 MG capsule Take 1 capsule (2 mg total) by mouth at bedtime. 09/09/14  Yes Barton Dubois, MD  B Complex-C-Folic Acid (DIALYVITE TABLET) TABS Take 1 tablet by mouth daily. Patient not taking: Reported on 04/03/2015 09/28/12   Sorin June Leap, MD  nicotine (NICODERM CQ - DOSED IN MG/24 HOURS) 21 mg/24hr patch Place 1 patch (21 mg total) onto the skin daily. Patient not taking: Reported on 07/30/2015 04/05/15   Barton Dubois, MD  Nutritional Supplements (FEEDING SUPPLEMENT, NEPRO CARB STEADY,) LIQD Take 237 mLs by mouth 2 (two) times daily between meals. Patient not taking: Reported on 07/30/2015 04/05/15   Barton Dubois, MD   BP 214/98 mmHg  Pulse 92  Temp(Src) 98.2 F (36.8 C) (Oral)  Resp 25  SpO2 97% Physical Exam  Constitutional: He is oriented to person, place, and time. He appears well-developed and well-nourished.  HENT:  Head: Normocephalic and atraumatic.  Eyes: Conjunctivae are normal.  Neck: Neck supple.  Cardiovascular: Normal rate, regular rhythm and normal heart sounds.   Pulmonary/Chest: Effort normal and breath sounds normal.  Abdominal: Soft. There is no tenderness.  Musculoskeletal: He exhibits no edema.  AV fistula upper left arm with  thrill. No erythema or warmth.   Neurological: He is alert and oriented to person, place, and time.  Skin: Skin is warm and dry.  Nursing note and vitals reviewed.   ED Course  Procedures (including critical care time) Labs Review Labs Reviewed  CBC WITH DIFFERENTIAL/PLATELET - Abnormal; Notable for the following:    RBC 3.77 (*)    Hemoglobin 11.2 (*)    HCT 33.1 (*)    Neutrophils Relative % 82 (*)    Lymphocytes Relative 10 (*)    All other components within normal limits  BASIC METABOLIC PANEL - Abnormal; Notable for the following:    Potassium 5.6 (*)    CO2 19 (*)    BUN 50 (*)    Creatinine, Ser 12.09 (*)    GFR calc non Af Amer 4 (*)    GFR calc Af Amer 4 (*)    Anion  gap 17 (*)    All other components within normal limits  I-STAT CHEM 8, ED - Abnormal; Notable for the following:    Potassium 5.4 (*)    BUN 56 (*)    Creatinine, Ser 12.20 (*)    Calcium, Ion 1.10 (*)    Hemoglobin 11.2 (*)    HCT 33.0 (*)    All other components within normal limits  TROPONIN I    Imaging Review Dg Chest Portable 1 View  07/30/2015   CLINICAL DATA:  Intermittent dyspnea and fatigue. Missed dialysis on Saturday.  EXAM: PORTABLE CHEST - 1 VIEW  COMPARISON:  04/03/2015  FINDINGS: There is mild central vascular prominence. No alveolar opacities. No large effusions. Mediastinal and hilar contours appear unremarkable and unchanged.  IMPRESSION: Mild central vascular congestion without alveolar edema.   Electronically Signed   By: Andreas Newport M.D.   On: 07/30/2015 06:16   I have personally reviewed and evaluated these images and lab results as part of my medical decision-making.   EKG Interpretation   Date/Time:  Monday July 30 2015 05:29:03 EDT Ventricular Rate:  100 PR Interval:  162 QRS Duration: 97 QT Interval:  399 QTC Calculation: 515 R Axis:   51 Text Interpretation:  Sinus tachycardia Probable left atrial enlargement  LVH with secondary repolarization abnormality  Anterior Q waves, possibly  due to LVH Prolonged QT interval No significant change since last tracing  Confirmed by HORTON  MD, [place] ([id number]) on 07/30/2015 5:34:46 AM      MDM   Final diagnoses:  ESRD on dialysis  Shortness of breath    Filed Vitals:   07/30/15 0815  BP: 214/98  Pulse: 92  Temp:   Resp: 25   Afebrile, NAD, non-toxic appearing, AAOx4.   Patient presenting with shortness of breath and missed dialysis. On examination patient is well appearing, non-toxic, and in no distress. Hypertensive, but vitals otherwise stable. Patient given home medications with improvement. Lungs clear to auscultation bilaterally. No extremity edema. Potassium noted at 5.4, creatinine and BUN elevated, mild vascular congestion noted on chest x-ray. Symptoms all consistent with the for dialysis. At this time patient is well-appearing no indications for emergent dialysis needed, has appointment at noon today will discharge home to get to routine dialysis this afternoon. Return precautions discussed. Patient is agreeable to plan. Patient is stable at time of discharge. Patient d/w with Dr. Dina Rich, agrees with plan.      Baron Sane, PA-C 07/30/15 1015  Merryl Hacker, MD 08/07/15 [phone]

## 2015-07-30 NOTE — ED Provider Notes (Signed)
MSE was initiated and I personally evaluated the patient and placed orders (if any) at  5:44 AM on July 30, 2015.  The patient appears stable so that the remainder of the MSE may be completed by another provider.  Patient presents with shortness of breath. Patient reports worsening shortness of breath since Thursday. He thinks that he may have overdone it at his brother's funeral. Last dialysis was Wednesday. He skipped dialysis on Saturday. He denies any fever. Does endorse cough. No worsening shortness of breath with position. Denies any chest pain but does report he "occasionally has chest pain." History of coronary artery disease. Nontoxic on exam. Vital signs reassuring. No oxygen requirement at this time. No significant peripheral edema. Lab work placed.  Chest x-ray ordered.  Merryl Hacker, MD 07/30/15 [phone]

## 2015-08-03 ENCOUNTER — Encounter: Payer: Self-pay | Admitting: Vascular Surgery

## 2015-08-07 ENCOUNTER — Ambulatory Visit (HOSPITAL_COMMUNITY)
Admission: RE | Admit: 2015-08-07 | Discharge: 2015-08-07 | Disposition: A | Discharge status: Home / Self Care | Payer: Medicare Other | Source: Ambulatory Visit | Attending: Vascular Surgery | Admitting: Vascular Surgery

## 2015-08-07 ENCOUNTER — Encounter: Payer: Self-pay | Admitting: Vascular Surgery

## 2015-08-07 ENCOUNTER — Ambulatory Visit (INDEPENDENT_AMBULATORY_CARE_PROVIDER_SITE_OTHER): Payer: Medicare Other | Admitting: Vascular Surgery

## 2015-08-07 VITALS — BP 196/87 | HR 63 | Temp 97.6°F | Resp 18 | Ht 70.0 in | Wt 133.0 lb

## 2015-08-07 DIAGNOSIS — I120 Hypertensive chronic kidney disease with stage 5 chronic kidney disease or end stage renal disease: Secondary | ICD-10-CM | POA: Diagnosis not present

## 2015-08-07 DIAGNOSIS — R208 Other disturbances of skin sensation: Secondary | ICD-10-CM

## 2015-08-07 DIAGNOSIS — I10 Essential (primary) hypertension: Secondary | ICD-10-CM | POA: Diagnosis not present

## 2015-08-07 DIAGNOSIS — I70219 Atherosclerosis of native arteries of extremities with intermittent claudication, unspecified extremity: Secondary | ICD-10-CM

## 2015-08-07 DIAGNOSIS — I69351 Hemiplegia and hemiparesis following cerebral infarction affecting right dominant side: Secondary | ICD-10-CM | POA: Diagnosis not present

## 2015-08-07 DIAGNOSIS — H548 Legal blindness, as defined in USA: Secondary | ICD-10-CM | POA: Diagnosis not present

## 2015-08-07 DIAGNOSIS — I639 Cerebral infarction, unspecified: Secondary | ICD-10-CM

## 2015-08-07 DIAGNOSIS — N186 End stage renal disease: Secondary | ICD-10-CM | POA: Diagnosis not present

## 2015-08-07 DIAGNOSIS — I252 Old myocardial infarction: Secondary | ICD-10-CM | POA: Diagnosis not present

## 2015-08-07 DIAGNOSIS — G629 Polyneuropathy, unspecified: Secondary | ICD-10-CM | POA: Diagnosis not present

## 2015-08-07 DIAGNOSIS — R200 Anesthesia of skin: Secondary | ICD-10-CM

## 2015-08-07 NOTE — Progress Notes (Signed)
Patient name: Trevor [name] MRN: [id number] DOB: [birth year] Sex: male   Referred by: Marval Regal  Reason for referral:  Chief Complaint  Patient presents with  . New Evaluation    right leg numbness  referred by Dr Marval Regal    HISTORY OF PRESENT ILLNESS: The patient presents today for evaluation of right leg numbness. He is a very frail-appearing gentleman. He reports that when he awakens in the morning he has numbness in his right leg can go from his knee down into his foot. He walks with a walker reports that he is unsteady on his feet and cannot walk any distance because of frequent falls. He does not walk To the level of claudication. He denies any lower extremity tissue loss. Has been on dialysis for a number of years with a left upper arm basilic vein transposition placed by Dr. Scot Dock in 2013.  Past Medical History  Diagnosis Date  . HTN (hypertension)   . Anxiety   . Cardiomyopathy   . Renal insufficiency   . Myocardial infarction   . CVA (cerebral infarction)   . Chest pain   . Shortness of breath   . Stroke   . Headache(784.0)   . Arthritis   . Blindness     Past Surgical History  Procedure Laterality Date  . Tee without cardioversion  10/09/2011    Procedure: TRANSESOPHAGEAL ECHOCARDIOGRAM (TEE);  Surgeon: Lelon Perla, MD;  Location: [facility] ENDOSCOPY;  Service: Cardiovascular;  Laterality: N/A;  . Right hand    . Insertion of dialysis catheter  09/21/2012    Procedure: INSERTION OF DIALYSIS CATHETER;  Surgeon: Angelia Mould, MD;  Location: [place];  Service: Vascular;  Laterality: N/A;  Right Internal Jugular Placement  . Left heart catheterization with coronary angiogram N/A 09/28/2012    Procedure: LEFT HEART CATHETERIZATION WITH CORONARY ANGIOGRAM;  Surgeon: Sherren Mocha, MD;  Location: [facility] CATH LAB;  Service: Cardiovascular;  Laterality: N/A;  . Left heart catheterization with coronary angiogram N/A 07/07/2014    Procedure: LEFT HEART  CATHETERIZATION WITH CORONARY ANGIOGRAM;  Surgeon: Leonie Man, MD;  Location: [facility] CATH LAB;  Service: Cardiovascular;  Laterality: N/A;    Social History   Social History  . Marital Status: Single    Spouse Name: N/A  . Number of Children: 2  . Years of Education: N/A   Occupational History  . Not on file.   Social History Main Topics  . Smoking status: Current Some Day Smoker -- 0.50 packs/day for 50 years    Types: Cigarettes  . Smokeless tobacco: Never Used  . Alcohol Use: No     Comment: Occasional  . Drug Use: No  . Sexual Activity: Yes   Other Topics Concern  . Not on file   Social History Narrative    Family History  Problem Relation Age of Onset  . Heart attack Mother     MI in her 63s  . Diabetes Mother   . Anesthesia problems Neg Hx   . Hypotension Neg Hx   . Malignant hyperthermia Neg Hx   . Pseudochol deficiency Neg Hx   . Alcohol abuse Father     Allergies as of 08/07/2015  . (No Known Allergies)    Current Outpatient Prescriptions on File Prior to Visit  Medication Sig Dispense Refill  . calcium acetate (PHOSLO) 667 MG capsule Take 667 mg by mouth 3 (three) times daily with meals.    . cloNIDine (CATAPRES) 0.1 MG tablet  Take 0.1 mg by mouth 2 (two) times daily.  0  . clopidogrel (PLAVIX) 75 MG tablet Take 1 tablet (75 mg total) by mouth daily. 30 tablet 2  . finasteride (PROSCAR) 5 MG tablet Take 5 mg by mouth daily.  0  . gabapentin (NEURONTIN) 100 MG capsule Take 1 capsule (100 mg total) by mouth 3 (three) times daily. 90 capsule 2  . hydrALAZINE (APRESOLINE) 50 MG tablet Take 1 tablet (50 mg total) by mouth 3 (three) times daily. 90 tablet 3  . metoprolol tartrate (LOPRESSOR) 25 MG tablet Take 0.5 tablets (12.5 mg total) by mouth 2 (two) times daily. 60 tablet 2  . multivitamin (RENA-VIT) TABS tablet Take 1 tablet by mouth daily.  0  . NIFEdipine (PROCARDIA XL/ADALAT-CC) 90 MG 24 hr tablet Take 1 tablet (90 mg total) by mouth daily. 30  tablet 2  . omega-3 acid ethyl esters (LOVAZA) 1 G capsule Take 1 capsule (1 g total) by mouth 2 (two) times daily. 60 capsule 2  . tamsulosin (FLOMAX) 0.4 MG CAPS capsule Take 0.4 mg by mouth 2 (two) times daily.  0  . B Complex-C-Folic Acid (DIALYVITE TABLET) TABS Take 1 tablet by mouth daily. (Patient not taking: Reported on 04/03/2015)  0  . nicotine (NICODERM CQ - DOSED IN MG/24 HOURS) 21 mg/24hr patch Place 1 patch (21 mg total) onto the skin daily. (Patient not taking: Reported on 07/30/2015) 28 patch 0  . Nutritional Supplements (FEEDING SUPPLEMENT, NEPRO CARB STEADY,) LIQD Take 237 mLs by mouth 2 (two) times daily between meals. (Patient not taking: Reported on 07/30/2015)  0  . OVER THE COUNTER MEDICATION Apply 1 application topically daily as needed (for shoulder pain).    . pravastatin (PRAVACHOL) 40 MG tablet Take 1 tablet (40 mg total) by mouth every evening. 30 tablet 12  . terazosin (HYTRIN) 2 MG capsule Take 1 capsule (2 mg total) by mouth at bedtime. (Patient not taking: Reported on 08/07/2015) 30 capsule 3   No current facility-administered medications on file prior to visit.        PHYSICAL EXAMINATION:  General: The patient is a well-nourished male, in no acute distress. Vital signs are BP 196/87 mmHg  Pulse 63  Temp(Src) 97.6 F (36.4 C)  Resp 18  Ht 5\' 10"  (1.778 m)  Wt 133 lb (60.328 kg)  BMI 19.08 kg/m2  SpO2 100% Pulmonary: There is a good air exchange bilaterally without wheezing or rales. Abdomen: Soft and non-tender with normal pitch bowel sounds. Musculoskeletal: There are no major deformities.  There is no significant extremity pain. Neurologic: No focal weakness or paresthesias are detected, Skin: There are no ulcer or rashes noted. Psychiatric: The patient has normal affect. Cardiovascular: Patient has 2+ femoral pulses and absent popliteal and distal pulses Has a nicely matured left upper arm basilic vein fistula No tissue loss in his lower  extremities   [facility] Vascular Lab Studies:  Ordered and Independently Reviewed this reveals ankle arm index of 0.68 on the right and 0.76 on the left with monophasic tibial flow and normal digital flow bilaterally  Impression and Plan:  I discussed these findings with patient. 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 I explained that this is not causing his right leg numbness and unsteady gait and ankle difficulty. He does not have the ability to walk to the level claudication. I do not feel there is any limb threatening ischemia. He was reassured with this discussion will see [place] again on as-needed basis  Curt Jews [facility] [place] Office: [phone]

## 2015-08-07 NOTE — Progress Notes (Signed)
Filed Vitals:   08/07/15 0916 08/07/15 0919  BP: 208/90 196/87  Pulse: 65 63  Temp: 97.6 F (36.4 C)   Resp: 18   Height: 5\' 10"  (1.778 m)   Weight: 133 lb (60.328 kg)   SpO2: 100%

## 2015-08-12 ENCOUNTER — Inpatient Hospital Stay (HOSPITAL_COMMUNITY)
Admission: EM | Admit: 2015-08-12 | Discharge: 2015-08-18 | DRG: 313 | Disposition: A | Discharge status: Skilled Nursing Facility | Payer: Medicare Other | Attending: Internal Medicine | Admitting: Internal Medicine

## 2015-08-12 ENCOUNTER — Encounter (HOSPITAL_COMMUNITY): Payer: Self-pay | Admitting: Nurse Practitioner

## 2015-08-12 ENCOUNTER — Emergency Department (HOSPITAL_COMMUNITY): Payer: Medicare Other

## 2015-08-12 DIAGNOSIS — R079 Chest pain, unspecified: Secondary | ICD-10-CM | POA: Diagnosis not present

## 2015-08-12 DIAGNOSIS — I132 Hypertensive heart and chronic kidney disease with heart failure and with stage 5 chronic kidney disease, or end stage renal disease: Secondary | ICD-10-CM | POA: Diagnosis not present

## 2015-08-12 DIAGNOSIS — I4581 Long QT syndrome: Secondary | ICD-10-CM | POA: Diagnosis present

## 2015-08-12 DIAGNOSIS — I10 Essential (primary) hypertension: Secondary | ICD-10-CM | POA: Diagnosis present

## 2015-08-12 DIAGNOSIS — I714 Abdominal aortic aneurysm, without rupture: Secondary | ICD-10-CM | POA: Diagnosis present

## 2015-08-12 DIAGNOSIS — I2510 Atherosclerotic heart disease of native coronary artery without angina pectoris: Secondary | ICD-10-CM | POA: Diagnosis present

## 2015-08-12 DIAGNOSIS — I739 Peripheral vascular disease, unspecified: Secondary | ICD-10-CM | POA: Diagnosis present

## 2015-08-12 DIAGNOSIS — F17210 Nicotine dependence, cigarettes, uncomplicated: Secondary | ICD-10-CM | POA: Diagnosis present

## 2015-08-12 DIAGNOSIS — N189 Chronic kidney disease, unspecified: Secondary | ICD-10-CM | POA: Diagnosis not present

## 2015-08-12 DIAGNOSIS — Z681 Body mass index (BMI) 19 or less, adult: Secondary | ICD-10-CM

## 2015-08-12 DIAGNOSIS — E1122 Type 2 diabetes mellitus with diabetic chronic kidney disease: Secondary | ICD-10-CM | POA: Diagnosis not present

## 2015-08-12 DIAGNOSIS — I25119 Atherosclerotic heart disease of native coronary artery with unspecified angina pectoris: Secondary | ICD-10-CM

## 2015-08-12 DIAGNOSIS — E43 Unspecified severe protein-calorie malnutrition: Secondary | ICD-10-CM | POA: Diagnosis not present

## 2015-08-12 DIAGNOSIS — R0789 Other chest pain: Secondary | ICD-10-CM

## 2015-08-12 DIAGNOSIS — Z992 Dependence on renal dialysis: Secondary | ICD-10-CM

## 2015-08-12 DIAGNOSIS — D631 Anemia in chronic kidney disease: Secondary | ICD-10-CM | POA: Diagnosis present

## 2015-08-12 DIAGNOSIS — I252 Old myocardial infarction: Secondary | ICD-10-CM | POA: Diagnosis not present

## 2015-08-12 DIAGNOSIS — I5042 Chronic combined systolic (congestive) and diastolic (congestive) heart failure: Secondary | ICD-10-CM | POA: Diagnosis present

## 2015-08-12 DIAGNOSIS — I5043 Acute on chronic combined systolic (congestive) and diastolic (congestive) heart failure: Secondary | ICD-10-CM | POA: Diagnosis present

## 2015-08-12 DIAGNOSIS — B1920 Unspecified viral hepatitis C without hepatic coma: Secondary | ICD-10-CM | POA: Diagnosis present

## 2015-08-12 DIAGNOSIS — R0602 Shortness of breath: Secondary | ICD-10-CM | POA: Diagnosis not present

## 2015-08-12 DIAGNOSIS — N186 End stage renal disease: Secondary | ICD-10-CM | POA: Diagnosis not present

## 2015-08-12 DIAGNOSIS — R269 Unspecified abnormalities of gait and mobility: Secondary | ICD-10-CM | POA: Diagnosis present

## 2015-08-12 DIAGNOSIS — Z7902 Long term (current) use of antithrombotics/antiplatelets: Secondary | ICD-10-CM

## 2015-08-12 DIAGNOSIS — Z720 Tobacco use: Secondary | ICD-10-CM | POA: Diagnosis present

## 2015-08-12 DIAGNOSIS — Z79899 Other long term (current) drug therapy: Secondary | ICD-10-CM

## 2015-08-12 DIAGNOSIS — N2581 Secondary hyperparathyroidism of renal origin: Secondary | ICD-10-CM | POA: Diagnosis present

## 2015-08-12 DIAGNOSIS — Z8673 Personal history of transient ischemic attack (TIA), and cerebral infarction without residual deficits: Secondary | ICD-10-CM

## 2015-08-12 DIAGNOSIS — H540 Blindness, both eyes: Secondary | ICD-10-CM | POA: Diagnosis present

## 2015-08-12 DIAGNOSIS — Z9181 History of falling: Secondary | ICD-10-CM

## 2015-08-12 DIAGNOSIS — I428 Other cardiomyopathies: Secondary | ICD-10-CM | POA: Diagnosis present

## 2015-08-12 DIAGNOSIS — M898X9 Other specified disorders of bone, unspecified site: Secondary | ICD-10-CM | POA: Diagnosis present

## 2015-08-12 DIAGNOSIS — D696 Thrombocytopenia, unspecified: Secondary | ICD-10-CM | POA: Diagnosis present

## 2015-08-12 DIAGNOSIS — K5900 Constipation, unspecified: Secondary | ICD-10-CM | POA: Diagnosis present

## 2015-08-12 DIAGNOSIS — D649 Anemia, unspecified: Secondary | ICD-10-CM | POA: Diagnosis present

## 2015-08-12 LAB — BASIC METABOLIC PANEL
Anion gap: 11 (ref 5–15)
BUN: 26 mg/dL — AB (ref 6–20)
CHLORIDE: 103 mmol/L (ref 101–111)
CO2: 26 mmol/L (ref 22–32)
CREATININE: 7.9 mg/dL — AB (ref 0.61–1.24)
Calcium: 9.6 mg/dL (ref 8.9–10.3)
GFR calc non Af Amer: 6 mL/min — ABNORMAL LOW (ref >60)
GFR, EST AFRICAN AMERICAN: 7 mL/min — AB (ref >60)
Glucose, Bld: 84 mg/dL (ref 65–99)
POTASSIUM: 3.9 mmol/L (ref 3.5–5.1)
Sodium: 140 mmol/L (ref 135–145)

## 2015-08-12 LAB — APTT: aPTT: 37 seconds (ref 24–37)

## 2015-08-12 LAB — CBC
HEMATOCRIT: 29.7 % — AB (ref 39.0–52.0)
Hemoglobin: 10.3 g/dL — ABNORMAL LOW (ref 13.0–17.0)
MCH: 30.8 pg (ref 26.0–34.0)
MCHC: 34.7 g/dL (ref 30.0–36.0)
MCV: 88.9 fL (ref 78.0–100.0)
PLATELETS: 141 10*3/uL — AB (ref 150–400)
RBC: 3.34 MIL/uL — AB (ref 4.22–5.81)
RDW: 13.5 % (ref 11.5–15.5)
WBC: 4.5 10*3/uL (ref 4.0–10.5)

## 2015-08-12 LAB — I-STAT TROPONIN, ED: TROPONIN I, POC: 0.09 ng/mL — AB (ref 0.00–0.08)

## 2015-08-12 LAB — PROTIME-INR
INR: 1.12 (ref 0.00–1.49)
PROTHROMBIN TIME: 14.6 s (ref 11.6–15.2)

## 2015-08-12 MED ORDER — FINASTERIDE 5 MG PO TABS
5.0000 mg | ORAL_TABLET | Freq: Every day | ORAL | Status: DC
  Administered 2015-08-13 – 2015-08-18 (×6): 5 mg via ORAL
  Filled 2015-08-12 (×6): qty 1

## 2015-08-12 MED ORDER — OXYCODONE HCL 5 MG PO TABS
5.0000 mg | ORAL_TABLET | ORAL | Status: DC | PRN
  Administered 2015-08-16: 5 mg via ORAL
  Filled 2015-08-12: qty 1

## 2015-08-12 MED ORDER — RENA-VITE PO TABS
1.0000 | ORAL_TABLET | Freq: Every day | ORAL | Status: DC
  Administered 2015-08-12 – 2015-08-17 (×6): 1 via ORAL
  Filled 2015-08-12 (×6): qty 1

## 2015-08-12 MED ORDER — ACETAMINOPHEN 325 MG PO TABS
650.0000 mg | ORAL_TABLET | Freq: Four times a day (QID) | ORAL | Status: DC | PRN
  Administered 2015-08-13 (×2): 650 mg via ORAL
  Filled 2015-08-12 (×2): qty 2

## 2015-08-12 MED ORDER — OMEGA-3-ACID ETHYL ESTERS 1 G PO CAPS
1.0000 g | ORAL_CAPSULE | Freq: Two times a day (BID) | ORAL | Status: DC
  Administered 2015-08-12 – 2015-08-18 (×12): 1 g via ORAL
  Filled 2015-08-12 (×12): qty 1

## 2015-08-12 MED ORDER — SODIUM CHLORIDE 0.9 % IJ SOLN: 3.0000 mL | INTRAMUSCULAR | Status: DC | PRN

## 2015-08-12 MED ORDER — HYDRALAZINE HCL 50 MG PO TABS
50.0000 mg | ORAL_TABLET | Freq: Three times a day (TID) | ORAL | Status: DC
  Administered 2015-08-12 – 2015-08-18 (×13): 50 mg via ORAL
  Filled 2015-08-12 (×14): qty 1

## 2015-08-12 MED ORDER — SODIUM CHLORIDE 0.9 % IJ SOLN: 3.0000 mL | Freq: Two times a day (BID) | INTRAMUSCULAR | Status: DC

## 2015-08-12 MED ORDER — ACETAMINOPHEN 650 MG RE SUPP: 650.0000 mg | Freq: Four times a day (QID) | RECTAL | Status: DC | PRN

## 2015-08-12 MED ORDER — CLOPIDOGREL BISULFATE 75 MG PO TABS
75.0000 mg | ORAL_TABLET | Freq: Every day | ORAL | Status: DC
  Administered 2015-08-13 – 2015-08-18 (×6): 75 mg via ORAL
  Filled 2015-08-12 (×6): qty 1

## 2015-08-12 MED ORDER — ONDANSETRON HCL 4 MG/2ML IJ SOLN
4.0000 mg | Freq: Four times a day (QID) | INTRAMUSCULAR | Status: DC | PRN
  Administered 2015-08-17: 4 mg via INTRAVENOUS
  Filled 2015-08-12: qty 2

## 2015-08-12 MED ORDER — CLONIDINE HCL 0.1 MG PO TABS
0.1000 mg | ORAL_TABLET | Freq: Two times a day (BID) | ORAL | Status: DC
  Administered 2015-08-12 – 2015-08-18 (×12): 0.1 mg via ORAL
  Filled 2015-08-12 (×12): qty 1

## 2015-08-12 MED ORDER — CALCIUM ACETATE 667 MG PO CAPS: 667.0000 mg | ORAL_CAPSULE | Freq: Three times a day (TID) | ORAL | Status: DC

## 2015-08-12 MED ORDER — NIFEDIPINE ER OSMOTIC RELEASE 90 MG PO TB24
90.0000 mg | ORAL_TABLET | Freq: Every day | ORAL | Status: DC
  Administered 2015-08-13 – 2015-08-18 (×6): 90 mg via ORAL
  Filled 2015-08-12 (×6): qty 1

## 2015-08-12 MED ORDER — SODIUM CHLORIDE 0.9 % IV SOLN: 250.0000 mL | INTRAVENOUS | Status: DC | PRN

## 2015-08-12 MED ORDER — TAMSULOSIN HCL 0.4 MG PO CAPS
0.4000 mg | ORAL_CAPSULE | Freq: Two times a day (BID) | ORAL | Status: DC
  Administered 2015-08-12 – 2015-08-18 (×12): 0.4 mg via ORAL
  Filled 2015-08-12 (×12): qty 1

## 2015-08-12 MED ORDER — PRAVASTATIN SODIUM 40 MG PO TABS
40.0000 mg | ORAL_TABLET | Freq: Every evening | ORAL | Status: DC
  Administered 2015-08-12 – 2015-08-16 (×5): 40 mg via ORAL
  Filled 2015-08-12 (×5): qty 1

## 2015-08-12 MED ORDER — NEPRO/CARBSTEADY PO LIQD
237.0000 mL | Freq: Two times a day (BID) | ORAL | Status: DC
  Administered 2015-08-13 – 2015-08-18 (×8): 237 mL via ORAL
  Filled 2015-08-12 (×14): qty 237

## 2015-08-12 MED ORDER — NITROGLYCERIN 2 % TD OINT
1.5000 [in_us] | TOPICAL_OINTMENT | Freq: Once | TRANSDERMAL | Status: AC
  Administered 2015-08-12: 1.5 [in_us] via TOPICAL
  Filled 2015-08-12: qty 1

## 2015-08-12 MED ORDER — METOPROLOL TARTRATE 12.5 MG HALF TABLET
12.5000 mg | ORAL_TABLET | Freq: Two times a day (BID) | ORAL | Status: DC
  Administered 2015-08-12: 12.5 mg via ORAL
  Filled 2015-08-12: qty 1

## 2015-08-12 MED ORDER — CALCIUM ACETATE (PHOS BINDER) 667 MG PO CAPS
667.0000 mg | ORAL_CAPSULE | Freq: Three times a day (TID) | ORAL | Status: DC
  Administered 2015-08-13 (×2): 667 mg via ORAL
  Filled 2015-08-12 (×4): qty 1

## 2015-08-12 MED ORDER — HYDROMORPHONE HCL 1 MG/ML IJ SOLN: 0.5000 mg | INTRAMUSCULAR | Status: DC | PRN

## 2015-08-12 MED ORDER — HEPARIN (PORCINE) IN NACL 100-0.45 UNIT/ML-% IJ SOLN
750.0000 [IU]/h | INTRAMUSCULAR | Status: DC
  Administered 2015-08-12: 1250 [IU]/h via INTRAVENOUS
  Administered 2015-08-13: 1000 [IU]/h via INTRAVENOUS
  Filled 2015-08-12 (×3): qty 250

## 2015-08-12 MED ORDER — ONDANSETRON HCL 4 MG PO TABS: 4.0000 mg | ORAL_TABLET | Freq: Four times a day (QID) | ORAL | Status: DC | PRN

## 2015-08-12 MED ORDER — GABAPENTIN 100 MG PO CAPS
100.0000 mg | ORAL_CAPSULE | Freq: Three times a day (TID) | ORAL | Status: DC
  Administered 2015-08-12 – 2015-08-18 (×15): 100 mg via ORAL
  Filled 2015-08-12 (×14): qty 1

## 2015-08-12 MED ORDER — TERAZOSIN HCL 2 MG PO CAPS
2.0000 mg | ORAL_CAPSULE | Freq: Every day | ORAL | Status: DC
  Administered 2015-08-12 – 2015-08-15 (×4): 2 mg via ORAL
  Filled 2015-08-12 (×6): qty 1

## 2015-08-12 MED ORDER — DIALYVITE PO TABS: 1.0000 | ORAL_TABLET | Freq: Every day | ORAL | Status: DC

## 2015-08-12 MED ORDER — NICOTINE 7 MG/24HR TD PT24
7.0000 mg | MEDICATED_PATCH | Freq: Every day | TRANSDERMAL | Status: DC
  Administered 2015-08-12 – 2015-08-18 (×7): 7 mg via TRANSDERMAL
  Filled 2015-08-12 (×7): qty 1

## 2015-08-12 MED ORDER — HYDRALAZINE HCL 20 MG/ML IJ SOLN: 10.0000 mg | Freq: Four times a day (QID) | INTRAMUSCULAR | Status: DC | PRN

## 2015-08-12 MED ORDER — PANTOPRAZOLE SODIUM 40 MG PO TBEC
40.0000 mg | DELAYED_RELEASE_TABLET | Freq: Every day | ORAL | Status: DC
  Administered 2015-08-12 – 2015-08-18 (×7): 40 mg via ORAL
  Filled 2015-08-12 (×7): qty 1

## 2015-08-12 MED ORDER — SODIUM CHLORIDE 0.9 % IJ SOLN
3.0000 mL | Freq: Two times a day (BID) | INTRAMUSCULAR | Status: DC
  Administered 2015-08-12 – 2015-08-18 (×9): 3 mL via INTRAVENOUS

## 2015-08-12 MED ORDER — HEPARIN BOLUS VIA INFUSION
3000.0000 [IU] | Freq: Once | INTRAVENOUS | Status: AC
  Administered 2015-08-12: 3000 [IU] via INTRAVENOUS
  Filled 2015-08-12: qty 3000

## 2015-08-12 MED ORDER — ASPIRIN EC 325 MG PO TBEC
325.0000 mg | DELAYED_RELEASE_TABLET | Freq: Every day | ORAL | Status: DC
  Administered 2015-08-13 – 2015-08-16 (×4): 325 mg via ORAL
  Filled 2015-08-12 (×4): qty 1

## 2015-08-12 MED ORDER — CLONIDINE HCL 0.1 MG PO TABS
0.1000 mg | ORAL_TABLET | Freq: Once | ORAL | Status: AC
  Administered 2015-08-12: 0.1 mg via ORAL
  Filled 2015-08-12: qty 1

## 2015-08-12 MED ORDER — NITROGLYCERIN 2 % TD OINT
1.0000 [in_us] | TOPICAL_OINTMENT | Freq: Four times a day (QID) | TRANSDERMAL | Status: DC
  Administered 2015-08-12 – 2015-08-13 (×2): 1 [in_us] via TOPICAL
  Filled 2015-08-12: qty 30

## 2015-08-12 NOTE — ED Notes (Signed)
Following up with Dr. Arnoldo Morale on patient status. Reviewed recent bp meds given. No new orders at this time.

## 2015-08-12 NOTE — H&P (Addendum)
[facility] Admission History and Physical       Trevor [name] [id number] DOB: [birth year] DOA: 08/12/2015  Referring physician: EDP PCP: [name], MD  Specialists:   Chief Complaint: Chest Pain  HPI: Trevor [name] is a 69 y.o. male with a history of CAD, HTN, Combined Systolic and Diastolic CHF, ESRD on HD ( MWF) who presents to the ED with complaints of Left Sided Chest pain described as heaviness rated at 9/10 associated with  SOB and lasted for 3 hours.    He denies any Nausea Vomiting or Diaphoresis.    He was evaluated in the ED and found to have a Troponin of 0.09, and S-T depression on EKG. He was referred for observation.      Review of Systems:  Constitutional: No Weight Loss, No Weight Gain, Night Sweats, Fevers, Chills, Dizziness, Light Headedness, Fatigue, or Generalized Weakness HEENT: No Headaches, Difficulty Swallowing,Tooth/Dental Problems,Sore Throat,  No Sneezing, Rhinitis, Ear Ache, Nasal Congestion, or Post Nasal Drip,  Cardio-vascular:  +Chest pain, Orthopnea, PND, Edema in Lower Extremities, Anasarca, Dizziness, Palpitations  Resp:   +Dyspnea, No DOE, No Productive Cough, No Non-Productive Cough, No Hemoptysis, No Wheezing.    GI: No Heartburn, Indigestion, Abdominal Pain, Nausea, Vomiting, Diarrhea, Constipation, Hematemesis, Hematochezia, Melena, Change in Bowel Habits,  Loss of Appetite  GU: No Dysuria, No Change in Color of Urine, No Urgency or Urinary Frequency, No Flank pain.  Musculoskeletal: No Joint Pain or Swelling, No Decreased Range of Motion, No Back Pain.  Neurologic: No Syncope, No Seizures, Muscle Weakness, Paresthesia, Vision Disturbance or Loss, No Diplopia, No Vertigo, No Difficulty Walking,  Skin: No Rash or Lesions. Psych: No Change in Mood or Affect, No Depression or Anxiety, No Memory loss, No Confusion, or Hallucinations   Past Medical History  Diagnosis Date  . HTN (hypertension)   . Anxiety   .  Cardiomyopathy   . Renal insufficiency   . Myocardial infarction   . CVA (cerebral infarction)   . Chest pain   . Shortness of breath   . Headache(784.0)   . Arthritis   . Blindness      Past Surgical History  Procedure Laterality Date  . Tee without cardioversion  10/09/2011    Procedure: TRANSESOPHAGEAL ECHOCARDIOGRAM (TEE);  Surgeon: Lelon Perla, MD;  Location: [facility] ENDOSCOPY;  Service: Cardiovascular;  Laterality: N/A;  . Right hand    . Insertion of dialysis catheter  09/21/2012    Procedure: INSERTION OF DIALYSIS CATHETER;  Surgeon: Angelia Mould, MD;  Location: [place];  Service: Vascular;  Laterality: N/A;  Right Internal Jugular Placement  . Left heart catheterization with coronary angiogram N/A 09/28/2012    Procedure: LEFT HEART CATHETERIZATION WITH CORONARY ANGIOGRAM;  Surgeon: Sherren Mocha, MD;  Location: [facility] CATH LAB;  Service: Cardiovascular;  Laterality: N/A;  . Left heart catheterization with coronary angiogram N/A 07/07/2014    Procedure: LEFT HEART CATHETERIZATION WITH CORONARY ANGIOGRAM;  Surgeon: Leonie Man, MD;  Location: [facility] CATH LAB;  Service: Cardiovascular;  Laterality: N/A;      Prior to Admission medications   Medication Sig Start Date End Date Taking? Authorizing Provider  B Complex-C-Folic Acid (DIALYVITE TABLET) TABS Take 1 tablet by mouth daily. 09/28/12  Yes Sorin June Leap, MD  calcium acetate (PHOSLO) 667 MG capsule Take 667 mg by mouth 3 (three) times daily with meals.    Historical Provider, MD  cloNIDine (CATAPRES) 0.1 MG tablet Take 0.1 mg by mouth 2 (two) times  daily. 03/28/15   Historical Provider, MD  clopidogrel (PLAVIX) 75 MG tablet Take 1 tablet (75 mg total) by mouth daily. 04/05/15   Barton Dubois, MD  finasteride (PROSCAR) 5 MG tablet Take 5 mg by mouth daily. 03/28/15   Historical Provider, MD  gabapentin (NEURONTIN) 100 MG capsule Take 1 capsule (100 mg total) by mouth 3 (three) times daily. 04/05/15   Barton Dubois, MD    hydrALAZINE (APRESOLINE) 50 MG tablet Take 1 tablet (50 mg total) by mouth 3 (three) times daily. 04/05/15   Barton Dubois, MD  metoprolol tartrate (LOPRESSOR) 25 MG tablet Take 0.5 tablets (12.5 mg total) by mouth 2 (two) times daily. 04/05/15   Barton Dubois, MD  multivitamin (RENA-VIT) TABS tablet Take 1 tablet by mouth daily. 03/28/15   Historical Provider, MD  nicotine (NICODERM CQ - DOSED IN MG/24 HOURS) 21 mg/24hr patch Place 1 patch (21 mg total) onto the skin daily. Patient not taking: Reported on 07/30/2015 04/05/15   Barton Dubois, MD  NIFEdipine (PROCARDIA XL/ADALAT-CC) 90 MG 24 hr tablet Take 1 tablet (90 mg total) by mouth daily. 04/05/15   Barton Dubois, MD  Nutritional Supplements (FEEDING SUPPLEMENT, NEPRO CARB STEADY,) LIQD Take 237 mLs by mouth 2 (two) times daily between meals. Patient not taking: Reported on 07/30/2015 04/05/15   Barton Dubois, MD  omega-3 acid ethyl esters (LOVAZA) 1 G capsule Take 1 capsule (1 g total) by mouth 2 (two) times daily. 04/05/15   Barton Dubois, MD  OVER THE COUNTER MEDICATION Apply 1 application topically daily as needed (for shoulder pain).    Historical Provider, MD  pravastatin (PRAVACHOL) 40 MG tablet Take 1 tablet (40 mg total) by mouth every evening. 03/06/15   Lelon Perla, MD  tamsulosin (FLOMAX) 0.4 MG CAPS capsule Take 0.4 mg by mouth 2 (two) times daily. 03/28/15   Historical Provider, MD  terazosin (HYTRIN) 2 MG capsule Take 1 capsule (2 mg total) by mouth at bedtime. Patient not taking: Reported on 08/07/2015 09/09/14   Barton Dubois, MD     No Known Allergies  Social History:  reports that he has been smoking Cigarettes.  He has a 25 pack-year smoking history. He has never used smokeless tobacco. He reports that he does not drink alcohol or use illicit drugs.    Family History  Problem Relation Age of Onset  . Heart attack Mother     MI in her 85s  . Diabetes Mother   . Anesthesia problems Neg Hx   . Hypotension Neg Hx   . Malignant  hyperthermia Neg Hx   . Pseudochol deficiency Neg Hx   . Alcohol abuse Father        Physical Exam:  GEN:  Pleasant Cachectic Elderly Appearing 69 y.o. African American male examined and in no acute distress; cooperative with exam Filed Vitals:   08/12/15 2000 08/12/15 2015 08/12/15 2027 08/12/15 2115  BP: 219/93 207/85 207/85 198/90  Pulse: 79 84  84  Temp:      TempSrc:      Resp: 21 26  19   Height:      Weight:      SpO2: 99% 98%  100%   Blood pressure 198/90, pulse 84, temperature 97.4 F (36.3 C), temperature source Oral, resp. rate 19, height 5\' 10"  (1.778 m), weight 60.328 kg (133 lb), SpO2 100 %. PSYCH: He is alert and oriented x4; does not appear anxious does not appear depressed; affect is normal HEENT: Normocephalic and Atraumatic, Mucous membranes  pink; PERRLA; EOM intact; Fundi:  Benign;  No scleral icterus, Nares: Patent, Oropharynx: Clear, Edentulous,    Neck:  FROM, No Cervical Lymphadenopathy nor Thyromegaly or Carotid Bruit; No JVD; Breasts:: Not examined CHEST WALL: No tenderness CHEST: Normal respiration, clear to auscultation bilaterally HEART: Regular rate and rhythm; no murmurs rubs or gallops BACK: No kyphosis or scoliosis; No CVA tenderness ABDOMEN: Positive Bowel Sounds, Scaphoid, Soft Non-Tender, No Rebound or Guarding; No Masses, No Organomegaly, No Pannus; No Intertriginous candida. Rectal Exam: Not done EXTREMITIES: No Cyanosis, Clubbing, or Edema; No Ulcerations. Genitalia: not examined PULSES: 2+ and symmetric SKIN: Dry Skin No rash or ulceration CNS:  Alert and Oriented x 4, Right sided Weakness (Chronic) Vascular: pulses palpable throughout    Labs on Admission:  Basic Metabolic Panel:  Recent Labs Lab 08/12/15 1819  NA 140  K 3.9  CL 103  CO2 26  GLUCOSE 84  BUN 26*  CREATININE 7.90*  CALCIUM 9.6   Liver Function Tests: No results for input(s): AST, ALT, ALKPHOS, BILITOT, PROT, ALBUMIN in the last 168 hours. No results for  input(s): LIPASE, AMYLASE in the last 168 hours. No results for input(s): AMMONIA in the last 168 hours. CBC:  Recent Labs Lab 08/12/15 1819  WBC 4.5  HGB 10.3*  HCT 29.7*  MCV 88.9  PLT 141*   Cardiac Enzymes: No results for input(s): CKTOTAL, CKMB, CKMBINDEX, TROPONINI in the last 168 hours.  BNP (last 3 results)  Recent Labs  01/08/15 1929  BNP >4500.0*    ProBNP (last 3 results) No results for input(s): PROBNP in the last 8760 hours.  CBG: No results for input(s): GLUCAP in the last 168 hours.  Radiological Exams on Admission: Dg Chest 2 View  08/12/2015   CLINICAL DATA:  Chest pain. LEFT-sided chest pain with nausea and shortness of breath for 3 hours.  EXAM: CHEST  2 VIEW  COMPARISON:  Multiple priors dating back to 2013.  FINDINGS: The cardiopericardial silhouette is enlarged. Tortuous thoracic aorta with arch atherosclerosis. No airspace disease or pleural effusion. Prominent nipple shadow projects over the RIGHT lung base.  IMPRESSION: Cardiomegaly without failure.   Electronically Signed   By: Dereck Ligas M.D.   On: 08/12/2015 18:39     EKG: Independently reviewed. Normal Sinus Rhythm  Rte = +LVH, S-T  Depression in Inferior Leasds        Assessment/Plan:      69 y.o. male with  Principal Problem:   1.    Chest pain   Cardiac Monitoring   Cycle Troponins   Nitropaste, O2, ASA, IV Heparin   Continue Plavix, Metoprolol, and Pravastatin   Last 2D ECHO   04/2015   Active Problems:   2.    CAD (coronary artery disease)   Continue Metoprolol, ASA,Plavix and  Pravastatin     3.    Malignant hypertension   Continue Metoprolol, Clonidine, Hydralazine   IV Hydralazine PRN     4.    Chronic combined systolic and diastolic CHF (congestive heart failure)   Dialysis MWF and PRN   Monitor O2 Sats     5.    ESRD on hemodialysis- on MWF   Notify Dialysis in AM to keep Schedule     6.    Tobacco use disorder   Nicotine Patch daily     7.    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    Protein-calorie malnutrition, severe  Nutrition Consult in AM     9.    History of stroke   Non-Acute  10.    DVT Prophylaxis   IV Heparin Drip    Code Status:     FULL CODE      Family Communication:  No Family Present    Disposition Plan:   Observation Status        Time spent:  57 Minutes      Theressa Millard [facility] Pager [phone]   If [place] Please Contact the Day Rounding Team MD for [facility]  If 7PM-7AM, Please Contact Night-Floor Coverage  [email or web address] Password [email or web address] 08/12/2015, 9:20 PM     ADDENDUM:   Patient was seen and examined on 08/12/2015

## 2015-08-12 NOTE — ED Notes (Signed)
Per EMS pt from home c/o substernal chest pain radiating to left chest that awoke pt from sleep. Pt. Endorses associated shortness of breath and nausea. EMS gave 324 ASA an 1 SL nitro with relief. Pt denies pain at present time

## 2015-08-12 NOTE — ED Notes (Signed)
Called Will, PA-C, in regards to patient's bp still high, over [id number] systolic. Also reported patient's request to eat. Will acknowledges, awaiting orders.

## 2015-08-12 NOTE — ED Notes (Signed)
Will, pa-c, allows for patient to eat, planning for admission. Meal provided, and updated patient.

## 2015-08-12 NOTE — Consult Note (Signed)
ANTICOAGULATION CONSULT NOTE - Initial Consult  Pharmacy Consult for Heparin Indication: chest pain/ACS  No Known Allergies  Patient Measurements: Height: 5\' 10"  (177.8 cm) Weight: 133 lb (60.328 kg) IBW/kg (Calculated) : 73   Vital Signs: Temp: 97.4 F (36.3 C) (09/11 1719) Temp Source: Oral (09/11 1719) BP: 183/88 mmHg (09/11 2130) Pulse Rate: 77 (09/11 2130)  Labs:  Recent Labs  08/12/15 1819  HGB 10.3*  HCT 29.7*  PLT 141*  CREATININE 7.90*    Estimated Creatinine Clearance: 7.5 mL/min (by C-G formula based on Cr of 7.9).   Medical History: Past Medical History  Diagnosis Date  . HTN (hypertension)   . Anxiety   . Cardiomyopathy   . Renal insufficiency   . Myocardial infarction   . CVA (cerebral infarction)   . Chest pain   . Shortness of breath   . Headache(784.0)   . Arthritis   . Blindness     Medications:  No anticoagulants pta  Assessment: 68yom presents to the ED with chest pain. First troponin mildly elevated. He will begin heparin. Back in August 2015 he required 1250 units/hr to reach therapeutic levels. Also noted he is ESRD on HD.  Goal of Therapy:  Heparin level 0.3-0.7 units/ml Monitor platelets by anticoagulation protocol: Yes   Plan:  1) Heparin bolus 3000 units x 1 2) Heparin drip at 1250 units/hr 3) Check 8 hour heparin level 4) Daily heparin level and CBC  Deboraha Sprang 08/12/2015,9:54 PM

## 2015-08-12 NOTE — ED Provider Notes (Signed)
CSN: [id number]     Arrival date & time 08/12/15  1709 History   First MD Initiated Contact with Patient 08/12/15 1843     Chief Complaint  Patient presents with  . Chest Pain   AMIIR WOODING is a 69 y.o. male with a history of ESRD on dialysis MWF, hypertension, cardiomegaly, NSTEMI, who presents to the emergency department complaining of chest pain and shortness of breath when waking from a nap today around 3 pm.  Patient reports he had substernal and left-sided chest pain with associated shortness of breath that after aspirin and nitroglycerin improved to 2 out of 3 pain. He reports his shortness of breath has resolved. He currently claims a 2 out of 3 left sided chest pain which she describes as numbness. Patient also reports having a cough today which is productive. He reports having palpitations earlier with this chest pain. Patient's last dialysis was Friday. Patient has a history of an STEMI. Patient is followed by cardiologist Dr. Stanford Breed. The patient denies fevers, chills, recent illness, current palpitations, leg pain, leg swelling, numbness, tingling, weakness, abdominal pain, nausea, vomiting, hemoptysis or rashes.  (Consider location/radiation/quality/duration/timing/severity/associated sxs/prior Treatment) HPI  Past Medical History  Diagnosis Date  . HTN (hypertension)   . Anxiety   . Cardiomyopathy   . Renal insufficiency   . Myocardial infarction   . CVA (cerebral infarction)   . Chest pain   . Shortness of breath   . Headache(784.0)   . Arthritis   . Blindness    Past Surgical History  Procedure Laterality Date  . Tee without cardioversion  10/09/2011    Procedure: TRANSESOPHAGEAL ECHOCARDIOGRAM (TEE);  Surgeon: Lelon Perla, MD;  Location: [facility] ENDOSCOPY;  Service: Cardiovascular;  Laterality: N/A;  . Right hand    . Insertion of dialysis catheter  09/21/2012    Procedure: INSERTION OF DIALYSIS CATHETER;  Surgeon: Angelia Mould, MD;  Location: [facility];   Service: Vascular;  Laterality: N/A;  Right Internal Jugular Placement  . Left heart catheterization with coronary angiogram N/A 09/28/2012    Procedure: LEFT HEART CATHETERIZATION WITH CORONARY ANGIOGRAM;  Surgeon: Sherren Mocha, MD;  Location: [facility] CATH LAB;  Service: Cardiovascular;  Laterality: N/A;  . Left heart catheterization with coronary angiogram N/A 07/07/2014    Procedure: LEFT HEART CATHETERIZATION WITH CORONARY ANGIOGRAM;  Surgeon: Leonie Man, MD;  Location: [facility] CATH LAB;  Service: Cardiovascular;  Laterality: N/A;   Family History  Problem Relation Age of Onset  . Heart attack Mother     MI in her 77s  . Diabetes Mother   . Anesthesia problems Neg Hx   . Hypotension Neg Hx   . Malignant hyperthermia Neg Hx   . Pseudochol deficiency Neg Hx   . Alcohol abuse Father    Social History  Substance Use Topics  . Smoking status: Current Some Day Smoker -- 0.50 packs/day for 50 years    Types: Cigarettes  . Smokeless tobacco: Never Used  . Alcohol Use: No     Comment: Occasional    Review of Systems  Constitutional: Negative for fever and chills.  HENT: Negative for congestion and sore throat.   Eyes: Negative for visual disturbance.  Respiratory: Positive for shortness of breath. Negative for cough and wheezing.   Cardiovascular: Positive for chest pain. Negative for leg swelling.  Gastrointestinal: Negative for nausea, vomiting, abdominal pain and diarrhea.  Genitourinary: Negative for dysuria.  Musculoskeletal: Negative for back pain and neck pain.  Skin: Negative for  rash.  Neurological: Negative for syncope, weakness, light-headedness, numbness and headaches.      Allergies  Review of patient's allergies indicates no known allergies.  Home Medications   Prior to Admission medications   Medication Sig Start Date End Date Taking? Authorizing Provider  B Complex-C-Folic Acid (DIALYVITE TABLET) TABS Take 1 tablet by mouth daily. 09/28/12  Yes Sorin June Leap, MD   calcium acetate (PHOSLO) 667 MG capsule Take 667 mg by mouth 3 (three) times daily with meals.    Historical Provider, MD  cloNIDine (CATAPRES) 0.1 MG tablet Take 0.1 mg by mouth 2 (two) times daily. 03/28/15   Historical Provider, MD  clopidogrel (PLAVIX) 75 MG tablet Take 1 tablet (75 mg total) by mouth daily. 04/05/15   Barton Dubois, MD  finasteride (PROSCAR) 5 MG tablet Take 5 mg by mouth daily. 03/28/15   Historical Provider, MD  gabapentin (NEURONTIN) 100 MG capsule Take 1 capsule (100 mg total) by mouth 3 (three) times daily. 04/05/15   Barton Dubois, MD  hydrALAZINE (APRESOLINE) 50 MG tablet Take 1 tablet (50 mg total) by mouth 3 (three) times daily. 04/05/15   Barton Dubois, MD  metoprolol tartrate (LOPRESSOR) 25 MG tablet Take 0.5 tablets (12.5 mg total) by mouth 2 (two) times daily. 04/05/15   Barton Dubois, MD  multivitamin (RENA-VIT) TABS tablet Take 1 tablet by mouth daily. 03/28/15   Historical Provider, MD  nicotine (NICODERM CQ - DOSED IN MG/24 HOURS) 21 mg/24hr patch Place 1 patch (21 mg total) onto the skin daily. Patient not taking: Reported on 07/30/2015 04/05/15   Barton Dubois, MD  NIFEdipine (PROCARDIA XL/ADALAT-CC) 90 MG 24 hr tablet Take 1 tablet (90 mg total) by mouth daily. 04/05/15   Barton Dubois, MD  Nutritional Supplements (FEEDING SUPPLEMENT, NEPRO CARB STEADY,) LIQD Take 237 mLs by mouth 2 (two) times daily between meals. Patient not taking: Reported on 07/30/2015 04/05/15   Barton Dubois, MD  omega-3 acid ethyl esters (LOVAZA) 1 G capsule Take 1 capsule (1 g total) by mouth 2 (two) times daily. 04/05/15   Barton Dubois, MD  OVER THE COUNTER MEDICATION Apply 1 application topically daily as needed (for shoulder pain).    Historical Provider, MD  pravastatin (PRAVACHOL) 40 MG tablet Take 1 tablet (40 mg total) by mouth every evening. 03/06/15   Lelon Perla, MD  tamsulosin (FLOMAX) 0.4 MG CAPS capsule Take 0.4 mg by mouth 2 (two) times daily. 03/28/15   Historical Provider, MD   terazosin (HYTRIN) 2 MG capsule Take 1 capsule (2 mg total) by mouth at bedtime. Patient not taking: Reported on 08/07/2015 09/09/14   Barton Dubois, MD   BP 166/78 mmHg  Pulse 70  Temp(Src) 97.7 F (36.5 C) (Oral)  Resp 20  Ht 5\' 10"  (1.778 m)  Wt 133 lb 14.4 oz (60.737 kg)  BMI 19.21 kg/m2  SpO2 100% Physical Exam  Constitutional: He is oriented to person, place, and time. He appears well-developed and well-nourished. No distress.  Nontoxic appearing.  HENT:  Head: Normocephalic and atraumatic.  Mouth/Throat: Oropharynx is clear and moist.  Eyes: Conjunctivae are normal. Pupils are equal, round, and reactive to light. Right eye exhibits no discharge. Left eye exhibits no discharge.  Neck: Normal range of motion. Neck supple. No JVD present. No tracheal deviation present.  Cardiovascular: Normal rate, regular rhythm, normal heart sounds and intact distal pulses.  Exam reveals no gallop and no friction rub.   Bilateral radial and dorsalis pedis pulses are intact.  Pulmonary/Chest:  Effort normal and breath sounds normal. No respiratory distress. He has no wheezes. He has no rales. He exhibits tenderness.  Lungs clear to auscultation bilaterally. The patient does have left-sided substernal chest wall tenderness to palpation which the patient reports does not reproduce his chest pain.  Abdominal: Soft. He exhibits no distension. There is no tenderness. There is no guarding.  Musculoskeletal: Normal range of motion. He exhibits no edema or tenderness.  No lower extremity edema or tenderness.  Lymphadenopathy:    He has no cervical adenopathy.  Neurological: He is alert and oriented to person, place, and time. Coordination normal.  Skin: Skin is warm and dry. No rash noted. He is not diaphoretic. No erythema. No pallor.  Psychiatric: He has a normal mood and affect. His behavior is normal.  Nursing note and vitals reviewed.   ED Course  Procedures (including critical care time) Labs  Review Labs Reviewed  BASIC METABOLIC PANEL - Abnormal; Notable for the following:    BUN 26 (*)    Creatinine, Ser 7.90 (*)    GFR calc non Af Amer 6 (*)    GFR calc Af Amer 7 (*)    All other components within normal limits  CBC - Abnormal; Notable for the following:    RBC 3.34 (*)    Hemoglobin 10.3 (*)    HCT 29.7 (*)    Platelets 141 (*)    All other components within normal limits  TROPONIN I - Abnormal; Notable for the following:    Troponin I 0.27 (*)    All other components within normal limits  I-STAT TROPOININ, ED - Abnormal; Notable for the following:    Troponin i, poc 0.09 (*)    All other components within normal limits  PROTIME-INR  APTT  HEPARIN LEVEL (UNFRACTIONATED)  BASIC METABOLIC PANEL  CBC  TROPONIN I  TROPONIN I    Imaging Review Dg Chest 2 View  08/12/2015   CLINICAL DATA:  Chest pain. LEFT-sided chest pain with nausea and shortness of breath for 3 hours.  EXAM: CHEST  2 VIEW  COMPARISON:  Multiple priors dating back to 2013.  FINDINGS: The cardiopericardial silhouette is enlarged. Tortuous thoracic aorta with arch atherosclerosis. No airspace disease or pleural effusion. Prominent nipple shadow projects over the RIGHT lung base.  IMPRESSION: Cardiomegaly without failure.   Electronically Signed   By: Dereck Ligas M.D.   On: 08/12/2015 18:39   I have personally reviewed and evaluated these images and lab results as part of my medical decision-making.   EKG Interpretation   Date/Time:  Sunday August 12 2015 17:21:13 EDT Ventricular Rate:  82 PR Interval:  158 QRS Duration: 100 QT Interval:  430 QTC Calculation: 502 R Axis:   24 Text Interpretation:  Sinus rhythm LVH with secondary repolarization  abnormality Prolonged QT interval Confirmed by Hazle Coca [phone]) on  08/12/2015 5:29:23 PM      Filed Vitals:   08/12/15 2130 08/12/15 2200 08/12/15 2208 08/13/15 0000  BP: 183/88  181/76 166/78  Pulse: 77  60 70  Temp:   97.5 F (36.4 C)  97.7 F (36.5 C)  TempSrc:   Oral Oral  Resp: 19   20  Height:  5\' 10"  (1.778 m)    Weight:  133 lb 14.4 oz (60.737 kg)    SpO2: 96%  100% 100%     MDM   Meds given in ED:  Medications  cloNIDine (CATAPRES) tablet 0.1 mg (0.1 mg Oral Given 08/12/15 2246)  clopidogrel (PLAVIX) tablet 75 mg (not administered)  finasteride (PROSCAR) tablet 5 mg (not administered)  gabapentin (NEURONTIN) capsule 100 mg (100 mg Oral Given 08/12/15 2246)  hydrALAZINE (APRESOLINE) tablet 50 mg (50 mg Oral Given 08/12/15 2246)  multivitamin (RENA-VIT) tablet 1 tablet (1 tablet Oral Given 08/12/15 2246)  metoprolol tartrate (LOPRESSOR) tablet 12.5 mg (12.5 mg Oral Given 08/12/15 2247)  NIFEdipine (PROCARDIA XL/ADALAT-CC) 24 hr tablet 90 mg (not administered)  omega-3 acid ethyl esters (LOVAZA) capsule 1 g (1 g Oral Given 08/12/15 2246)  feeding supplement (NEPRO CARB STEADY) liquid 237 mL (not administered)  terazosin (HYTRIN) capsule 2 mg (2 mg Oral Given 08/12/15 2347)  pravastatin (PRAVACHOL) tablet 40 mg (40 mg Oral Given 08/12/15 2348)  tamsulosin (FLOMAX) capsule 0.4 mg (0.4 mg Oral Given 08/12/15 2348)  acetaminophen (TYLENOL) tablet 650 mg (650 mg Oral Given 08/13/15 0017)    Or  acetaminophen (TYLENOL) suppository 650 mg ( Rectal See Alternative 08/13/15 0017)  oxyCODONE (Oxy IR/ROXICODONE) immediate release tablet 5 mg (not administered)  HYDROmorphone (DILAUDID) injection 0.5-1 mg (not administered)  ondansetron (ZOFRAN) tablet 4 mg (not administered)    Or  ondansetron (ZOFRAN) injection 4 mg (not administered)  sodium chloride 0.9 % injection 3 mL (3 mLs Intravenous Given 08/12/15 2252)  sodium chloride 0.9 % injection 3 mL ( Intravenous Canceled Entry 08/12/15 2252)  sodium chloride 0.9 % injection 3 mL (not administered)  0.9 %  sodium chloride infusion (not administered)  aspirin EC tablet 325 mg (not administered)  nitroGLYCERIN (NITROGLYN) 2 % ointment 1 inch (1 inch Topical Given 08/12/15 2347)   pantoprazole (PROTONIX) EC tablet 40 mg (40 mg Oral Given 08/12/15 2246)  heparin ADULT infusion 100 units/mL (25000 units/250 mL) (1,250 Units/hr Intravenous New Bag/Given 08/12/15 2250)  hydrALAZINE (APRESOLINE) injection 10 mg (not administered)  nicotine (NICODERM CQ - dosed in mg/24 hr) patch 7 mg (7 mg Transdermal Patch Applied 08/12/15 2347)  calcium acetate (PHOSLO) capsule 667 mg (not administered)  Influenza vac split quadrivalent PF (FLUARIX) injection 0.5 mL (not administered)  pneumococcal 23 valent vaccine (PNU-IMMUNE) injection 0.5 mL (not administered)  nitroGLYCERIN (NITROGLYN) 2 % ointment 1.5 inch (1.5 inches Topical Given 08/12/15 1737)  cloNIDine (CATAPRES) tablet 0.1 mg (0.1 mg Oral Given 08/12/15 2027)  heparin bolus via infusion 3,000 Units (3,000 Units Intravenous Given 08/12/15 2250)    Current Discharge Medication List      Final diagnoses:  Chest pain, unspecified chest pain type   This is a 69 y.o. male with a history of ESRD on dialysis MWF, hypertension, cardiomegaly, NSTEMI, who presents to the emergency department complaining of chest pain and shortness of breath when waking from a nap today around 3 pm.  Patient reports he had substernal and left-sided chest pain with associated shortness of breath that after aspirin and nitroglycerin improved to 2 out of 3 pain. He reports his shortness of breath has resolved. At reevlaution the patient reports his chest pain has resolved.  On exam the patient is afebrile nontoxic appearing. He is chronically hypertensive. His lungs were clear auscultation bilaterally. He has no lower extremity edema or tenderness. EKG indicates Sinus rhythm LVH with secondary repolarization abnormality and prolonged QT interval. Troponin is elevated at 0.09. BMP is unremarkable for a patient on end-stage renal disease and dialysis. CBC is stable and unremarkable. Chest x-ray indicates cardiomegaly without failure. Plan for admission for ACS rule  out. Chest pain has resolved. Will consult for admission. Dr. Arnoldo Morale accepted for admission and requested  telemetry bed. The patient is in agreement with admission.   This patient was discussed with Dr. Ralene Bathe who agrees with assessment and plan.    Waynetta Pean, PA-C 08/13/15 0138  Quintella Reichert, MD 08/13/15 1316

## 2015-08-13 DIAGNOSIS — R0789 Other chest pain: Secondary | ICD-10-CM

## 2015-08-13 DIAGNOSIS — Z8673 Personal history of transient ischemic attack (TIA), and cerebral infarction without residual deficits: Secondary | ICD-10-CM

## 2015-08-13 DIAGNOSIS — I509 Heart failure, unspecified: Secondary | ICD-10-CM

## 2015-08-13 DIAGNOSIS — I209 Angina pectoris, unspecified: Secondary | ICD-10-CM

## 2015-08-13 DIAGNOSIS — I25118 Atherosclerotic heart disease of native coronary artery with other forms of angina pectoris: Secondary | ICD-10-CM

## 2015-08-13 DIAGNOSIS — Z992 Dependence on renal dialysis: Secondary | ICD-10-CM

## 2015-08-13 DIAGNOSIS — Z794 Long term (current) use of insulin: Secondary | ICD-10-CM | POA: Diagnosis not present

## 2015-08-13 DIAGNOSIS — I10 Essential (primary) hypertension: Secondary | ICD-10-CM

## 2015-08-13 DIAGNOSIS — G8190 Hemiplegia, unspecified affecting unspecified side: Secondary | ICD-10-CM

## 2015-08-13 DIAGNOSIS — E1129 Type 2 diabetes mellitus with other diabetic kidney complication: Secondary | ICD-10-CM | POA: Diagnosis not present

## 2015-08-13 DIAGNOSIS — I110 Hypertensive heart disease with heart failure: Secondary | ICD-10-CM

## 2015-08-13 DIAGNOSIS — N186 End stage renal disease: Secondary | ICD-10-CM

## 2015-08-13 DIAGNOSIS — Z720 Tobacco use: Secondary | ICD-10-CM

## 2015-08-13 LAB — CBC
HCT: 24.9 % — ABNORMAL LOW (ref 39.0–52.0)
Hemoglobin: 8.6 g/dL — ABNORMAL LOW (ref 13.0–17.0)
MCH: 30.1 pg (ref 26.0–34.0)
MCHC: 34.5 g/dL (ref 30.0–36.0)
MCV: 87.1 fL (ref 78.0–100.0)
PLATELETS: 130 10*3/uL — AB (ref 150–400)
RBC: 2.86 MIL/uL — AB (ref 4.22–5.81)
RDW: 13.5 % (ref 11.5–15.5)
WBC: 4.1 10*3/uL (ref 4.0–10.5)

## 2015-08-13 LAB — BASIC METABOLIC PANEL
Anion gap: 10 (ref 5–15)
BUN: 33 mg/dL — ABNORMAL HIGH (ref 6–20)
CALCIUM: 8.8 mg/dL — AB (ref 8.9–10.3)
CO2: 26 mmol/L (ref 22–32)
CREATININE: 8.71 mg/dL — AB (ref 0.61–1.24)
Chloride: 102 mmol/L (ref 101–111)
GFR, EST AFRICAN AMERICAN: 6 mL/min — AB (ref >60)
GFR, EST NON AFRICAN AMERICAN: 5 mL/min — AB (ref >60)
Glucose, Bld: 96 mg/dL (ref 65–99)
Potassium: 3.9 mmol/L (ref 3.5–5.1)
SODIUM: 138 mmol/L (ref 135–145)

## 2015-08-13 LAB — TROPONIN I
TROPONIN I: 0.27 ng/mL — AB (ref <0.031)
TROPONIN I: 0.49 ng/mL — AB (ref <0.031)
TROPONIN I: 0.47 ng/mL — AB (ref <0.031)

## 2015-08-13 LAB — HEPARIN LEVEL (UNFRACTIONATED)
HEPARIN UNFRACTIONATED: 0.78 [IU]/mL — AB (ref 0.30–0.70)
HEPARIN UNFRACTIONATED: 0.71 [IU]/mL — AB (ref 0.30–0.70)

## 2015-08-13 MED ORDER — PENTAFLUOROPROP-TETRAFLUOROETH EX AERO: 1.0000 "application " | INHALATION_SPRAY | CUTANEOUS | Status: DC | PRN

## 2015-08-13 MED ORDER — DOXERCALCIFEROL 4 MCG/2ML IV SOLN
2.0000 ug | INTRAVENOUS | Status: DC
  Administered 2015-08-15 – 2015-08-17 (×2): 2 ug via INTRAVENOUS
  Filled 2015-08-13 (×2): qty 2

## 2015-08-13 MED ORDER — HEPARIN SODIUM (PORCINE) 1000 UNIT/ML DIALYSIS
20.0000 [IU]/kg | INTRAMUSCULAR | Status: DC | PRN
  Filled 2015-08-13: qty 2

## 2015-08-13 MED ORDER — SODIUM CHLORIDE 0.9 % IV SOLN: 100.0000 mL | INTRAVENOUS | Status: DC | PRN

## 2015-08-13 MED ORDER — SODIUM CHLORIDE 0.9 % IV SOLN
62.5000 mg | INTRAVENOUS | Status: DC
  Administered 2015-08-17: 62.5 mg via INTRAVENOUS
  Filled 2015-08-13 (×2): qty 5

## 2015-08-13 MED ORDER — LIDOCAINE-PRILOCAINE 2.5-2.5 % EX CREA
1.0000 "application " | TOPICAL_CREAM | CUTANEOUS | Status: DC | PRN
  Filled 2015-08-13: qty 5

## 2015-08-13 MED ORDER — LIDOCAINE HCL (PF) 1 % IJ SOLN: 5.0000 mL | INTRAMUSCULAR | Status: DC | PRN

## 2015-08-13 MED ORDER — INFLUENZA VAC SPLIT QUAD 0.5 ML IM SUSY
0.5000 mL | PREFILLED_SYRINGE | INTRAMUSCULAR | Status: AC
  Administered 2015-08-14: 0.5 mL via INTRAMUSCULAR
  Filled 2015-08-13: qty 0.5

## 2015-08-13 MED ORDER — PNEUMOCOCCAL VAC POLYVALENT 25 MCG/0.5ML IJ INJ
0.5000 mL | INJECTION | INTRAMUSCULAR | Status: AC
  Administered 2015-08-14: 0.5 mL via INTRAMUSCULAR
  Filled 2015-08-13: qty 0.5

## 2015-08-13 MED ORDER — ALTEPLASE 2 MG IJ SOLR
2.0000 mg | Freq: Once | INTRAMUSCULAR | Status: DC | PRN
  Filled 2015-08-13: qty 2

## 2015-08-13 MED ORDER — HEPARIN SODIUM (PORCINE) 1000 UNIT/ML DIALYSIS
1000.0000 [IU] | INTRAMUSCULAR | Status: DC | PRN
  Filled 2015-08-13: qty 1

## 2015-08-13 MED ORDER — CALCIUM ACETATE (PHOS BINDER) 667 MG PO CAPS
2001.0000 mg | ORAL_CAPSULE | Freq: Three times a day (TID) | ORAL | Status: DC
  Administered 2015-08-13 – 2015-08-18 (×11): 2001 mg via ORAL
  Filled 2015-08-13 (×17): qty 3

## 2015-08-13 MED ORDER — CARVEDILOL 6.25 MG PO TABS
6.2500 mg | ORAL_TABLET | Freq: Two times a day (BID) | ORAL | Status: DC
  Administered 2015-08-13 – 2015-08-16 (×7): 6.25 mg via ORAL
  Filled 2015-08-13 (×5): qty 1
  Filled 2015-08-13: qty 2
  Filled 2015-08-13: qty 1

## 2015-08-13 NOTE — Progress Notes (Addendum)
CSW (Clinical Education officer, museum) notified pt will have transportation needs at dc. Per pt, he is established with [facility] and this is his only means of transportation. CSW confirmed pt address and spoke with [facility] operator at [phone]. CSW arranged pt transport for tomorrow at 4pm. CSW will need to call by 3pm to reschedule/cancel if pt will not be ready for dc at this time. Pt will be picked up at main hospital entrance.  [place], [place]

## 2015-08-13 NOTE — Progress Notes (Signed)
ANTICOAGULATION CONSULT NOTE - Follow Up Consult  Pharmacy Consult for heparin Indication: chest pain/ACS  Labs:  Recent Labs  08/12/15 1819 08/12/15 2235 08/13/15 0413 08/13/15 0623  HGB 10.3*  --  8.6*  --   HCT 29.7*  --  24.9*  --   PLT 141*  --  130*  --   APTT  --  37  --   --   LABPROT  --  14.6  --   --   INR  --  1.12  --   --   HEPARINUNFRC  --   --   --  0.78*  CREATININE 7.90*  --  8.71*  --   TROPONINI  --  0.27* 0.49*  --      Assessment: 68yo male supratherapeutic on heparin with initial dosing for CP.  Goal of Therapy:  Heparin level 0.3-0.7 units/ml   Plan:  Will decrease heparin gtt by 2 units/kg/hr to 1100 units/hr and check level in [employer], PharmD, BCPS  08/13/2015,7:03 AM

## 2015-08-13 NOTE — Progress Notes (Signed)
[facility] PROGRESS NOTE  WILDER ZENI [id number] DOB: [birth year] DOA: 08/12/2015 PCP: [name], MD  Assessment/Plan:  Principal Problem:   Chest pain with minimally elevated trop. EKG with LVH and strain, largely unchanged from previous. Already ate breakfast. D/w Dr. Sallyanne Kuster.  Stress tomorrow. No further CP. C/o HA. D/c nitropaste Active Problems:   Tobacco use disorder: Counseled against   History of stroke   Chronic combined systolic and diastolic CHF (congestive heart failure): Due for dialysis today. Discussed with Dr. Florene Glen.   Anemia   ESRD on hemodialysis   Protein-calorie malnutrition, severe   CAD (coronary artery disease)   Malignant hypertension: Improved.  HPI/Subjective: Complains of headache. No chest pain. No dyspnea.  Objective: Filed Vitals:   08/13/15 0340  BP: 130/62  Pulse: 67  Temp: 97.8 F (36.6 C)  Resp: 18    Intake/Output Summary (Last 24 hours) at 08/13/15 0835 Last data filed at 08/13/15 0508  Gross per 24 hour  Intake  78.74 ml  Output      0 ml  Net  78.74 ml   Filed Weights   08/12/15 1719 08/12/15 2200  Weight: 60.328 kg (133 lb) 60.737 kg (133 lb 14.4 oz)    Exam:   General:  Alert, oriented. Comfortable.  Cardiovascular: Regular rate rhythm without murmurs gallops rubs  Respiratory: Clear to auscultation bilaterally without wheezes rhonchi or rales  Abdomen: Soft nontender nondistended  Ext: AV fistula on left upper extremity with thrill. No peripheral edema.  Basic Metabolic Panel:  Recent Labs Lab 08/12/15 1819 08/13/15 0413  NA 140 138  K 3.9 3.9  CL 103 102  CO2 26 26  GLUCOSE 84 96  BUN 26* 33*  CREATININE 7.90* 8.71*  CALCIUM 9.6 8.8*   Liver Function Tests: No results for input(s): AST, ALT, ALKPHOS, BILITOT, PROT, ALBUMIN in the last 168 hours. No results for input(s): LIPASE, AMYLASE in the last 168 hours. No results for input(s): AMMONIA in the last 168  hours. CBC:  Recent Labs Lab 08/12/15 1819 08/13/15 0413  WBC 4.5 4.1  HGB 10.3* 8.6*  HCT 29.7* 24.9*  MCV 88.9 87.1  PLT 141* 130*   Cardiac Enzymes:  Recent Labs Lab 08/12/15 2235 08/13/15 0413  TROPONINI 0.27* 0.49*   BNP (last 3 results)  Recent Labs  01/08/15 1929  BNP >4500.0*    ProBNP (last 3 results) No results for input(s): PROBNP in the last 8760 hours.  CBG: No results for input(s): GLUCAP in the last 168 hours.  No results found for this or any previous visit (from the past 240 hour(s)).   Studies: Dg Chest 2 View  08/12/2015   CLINICAL DATA:  Chest pain. LEFT-sided chest pain with nausea and shortness of breath for 3 hours.  EXAM: CHEST  2 VIEW  COMPARISON:  Multiple priors dating back to 2013.  FINDINGS: The cardiopericardial silhouette is enlarged. Tortuous thoracic aorta with arch atherosclerosis. No airspace disease or pleural effusion. Prominent nipple shadow projects over the RIGHT lung base.  IMPRESSION: Cardiomegaly without failure.   Electronically Signed   By: Dereck Ligas M.D.   On: 08/12/2015 18:39   EKG tracing reviewed. LVH with strain, nsr  Scheduled Meds: . aspirin EC  325 mg Oral Daily  . calcium acetate  667 mg Oral TID WC  . cloNIDine  0.1 mg Oral BID  . clopidogrel  75 mg Oral Daily  . feeding supplement (NEPRO CARB STEADY)  237 mL Oral BID BM  .  finasteride  5 mg Oral Daily  . gabapentin  100 mg Oral TID  . hydrALAZINE  50 mg Oral TID  . [START ON 08/14/2015] Influenza vac split quadrivalent PF  0.5 mL Intramuscular Tomorrow-1000  . metoprolol tartrate  12.5 mg Oral BID  . multivitamin  1 tablet Oral QHS  . nicotine  7 mg Transdermal Daily  . NIFEdipine  90 mg Oral Daily  . nitroGLYCERIN  1 inch Topical 4 times per day  . omega-3 acid ethyl esters  1 g Oral BID  . pantoprazole  40 mg Oral Daily  . [START ON 08/14/2015] pneumococcal 23 valent vaccine  0.5 mL Intramuscular Tomorrow-1000  . pravastatin  40 mg Oral QPM  .  sodium chloride  3 mL Intravenous Q12H  . sodium chloride  3 mL Intravenous Q12H  . tamsulosin  0.4 mg Oral BID  . terazosin  2 mg Oral QHS   Continuous Infusions: . heparin 1,100 Units/hr (08/13/15 0751)    Time spent: 35 minutes  [facility] [email or web address], password [facility] 08/13/2015, 8:35 AM

## 2015-08-13 NOTE — Progress Notes (Signed)
ANTICOAGULATION CONSULT NOTE - Follow Up Consult  Pharmacy Consult for Heparin Indication: chest pain/ACS  No Known Allergies  Patient Measurements: Height: 5\' 10"  (177.8 cm) Weight: 133 lb 14.4 oz (60.737 kg) IBW/kg (Calculated) : 73 Heparin Dosing Weight: 60.7 kg  Vital Signs: Temp: 98.7 F (37.1 C) (09/12 1602) Temp Source: Oral (09/12 1602) BP: 142/62 mmHg (09/12 1602) Pulse Rate: 64 (09/12 1602)  Labs:  Recent Labs  08/12/15 1819 08/12/15 2235 08/13/15 0413 08/13/15 0623 08/13/15 0948 08/13/15 1510  HGB 10.3*  --  8.6*  --   --   --   HCT 29.7*  --  24.9*  --   --   --   PLT 141*  --  130*  --   --   --   APTT  --  37  --   --   --   --   LABPROT  --  14.6  --   --   --   --   INR  --  1.12  --   --   --   --   HEPARINUNFRC  --   --   --  0.78*  --  0.71*  CREATININE 7.90*  --  8.71*  --   --   --   TROPONINI  --  0.27* 0.49*  --  0.47*  --    Assessment:   Heparin level this afternoon is just above goal (0.71) on 1100 units/hr.      For hemodialysis today, stress test on 9/13.  Goal of Therapy:  Heparin level 0.3-0.7 units/ml Monitor platelets by anticoagulation protocol: Yes   Plan:   Decrease heparin drip to 1000 units/hr.  Daily heparin level and CBC; next labs in am.  Arty Baumgartner, [facility] Pager: [phone] 08/13/2015,4:47 PM

## 2015-08-13 NOTE — Consult Note (Signed)
Reason for Consult: Chest pain  Requesting Physician: Conley Canal  Cardiologist: Stanford Breed  HPI: This is a 69 y.o. male with a past medical history significant for moderate CAD (50-70% left circumflex with no significant obstruction by FFR, 60% diagonal, cath Aug 2015), ESRD on HD MWF, previous stroke, remote cocaine use (clean since 2012) presents with sharp chest pain at rest since 3-4 PM yesterday, now resolved, but with a lingering sensation of light pressure over upper left chest. He looks very comfortable, has had a full breakfast and was sleeping.  BP elevated, out of clonidine for "a little while".  Cardiac enzymes show marginal abnormality (peak troponin 0.49) and ECG shows LVH with secondary repolarization changes quite similar to previous tracings.  He has a small infrarenal AAA (2.7 cm). Last LVEF 50%. Walks with a walker due to residual right leg weakness.  PMHx:  Past Medical History  Diagnosis Date  . HTN (hypertension)   . Anxiety   . Cardiomyopathy   . Renal insufficiency   . Myocardial infarction   . CVA (cerebral infarction)   . Chest pain   . Shortness of breath   . Headache(784.0)   . Arthritis   . Blindness    Past Surgical History  Procedure Laterality Date  . Tee without cardioversion  10/09/2011    Procedure: TRANSESOPHAGEAL ECHOCARDIOGRAM (TEE);  Surgeon: Lelon Perla, MD;  Location: [facility] ENDOSCOPY;  Service: Cardiovascular;  Laterality: N/A;  . Right hand    . Insertion of dialysis catheter  09/21/2012    Procedure: INSERTION OF DIALYSIS CATHETER;  Surgeon: Angelia Mould, MD;  Location: [place];  Service: Vascular;  Laterality: N/A;  Right Internal Jugular Placement  . Left heart catheterization with coronary angiogram N/A 09/28/2012    Procedure: LEFT HEART CATHETERIZATION WITH CORONARY ANGIOGRAM;  Surgeon: Sherren Mocha, MD;  Location: [facility] CATH LAB;  Service: Cardiovascular;  Laterality: N/A;  . Left heart catheterization with  coronary angiogram N/A 07/07/2014    Procedure: LEFT HEART CATHETERIZATION WITH CORONARY ANGIOGRAM;  Surgeon: Leonie Man, MD;  Location: [facility] CATH LAB;  Service: Cardiovascular;  Laterality: N/A;    FAMHx: Family History  Problem Relation Age of Onset  . Heart attack Mother     MI in her 3s  . Diabetes Mother   . Anesthesia problems Neg Hx   . Hypotension Neg Hx   . Malignant hyperthermia Neg Hx   . Pseudochol deficiency Neg Hx   . Alcohol abuse Father     SOCHx:  reports that he has been smoking Cigarettes.  He has a 25 pack-year smoking history. He has never used smokeless tobacco. He reports that he does not drink alcohol or use illicit drugs.  ALLERGIES: No Known Allergies  ROS: Constitutional: Negative for fever and chills.  HENT: Negative for congestion and sore throat.  Eyes: Negative for visual disturbance.  Respiratory: Positive for shortness of breath. Negative for cough and wheezing.  Cardiovascular: Positive for chest pain. Negative for leg swelling.  Gastrointestinal: Negative for nausea, vomiting, abdominal pain and diarrhea.  Genitourinary: Negative for dysuria.  Musculoskeletal: Negative for back pain and neck pain.  Skin: Negative for rash.  Neurological: Negative for syncope, weakness, light-headedness, numbness and headaches.  A comprehensive review of systems was negative.  HOME MEDICATIONS: Prescriptions prior to admission  Medication Sig Dispense Refill Last Dose  . B Complex-C-Folic Acid (DIALYVITE TABLET) TABS Take 1 tablet by mouth daily.  0 Past Month at Unknown time  .  calcium acetate (PHOSLO) 667 MG capsule Take 667 mg by mouth 3 (three) times daily with meals.   Taking  . cloNIDine (CATAPRES) 0.1 MG tablet Take 0.1 mg by mouth 2 (two) times daily.  0 Taking  . clopidogrel (PLAVIX) 75 MG tablet Take 1 tablet (75 mg total) by mouth daily. 30 tablet 2 Taking  . finasteride (PROSCAR) 5 MG tablet Take 5 mg by mouth daily.  0 Taking  . gabapentin  (NEURONTIN) 100 MG capsule Take 1 capsule (100 mg total) by mouth 3 (three) times daily. 90 capsule 2 Taking  . hydrALAZINE (APRESOLINE) 50 MG tablet Take 1 tablet (50 mg total) by mouth 3 (three) times daily. 90 tablet 3 Taking  . metoprolol tartrate (LOPRESSOR) 25 MG tablet Take 0.5 tablets (12.5 mg total) by mouth 2 (two) times daily. 60 tablet 2 Taking  . multivitamin (RENA-VIT) TABS tablet Take 1 tablet by mouth daily.  0 Taking  . nicotine (NICODERM CQ - DOSED IN MG/24 HOURS) 21 mg/24hr patch Place 1 patch (21 mg total) onto the skin daily. (Patient not taking: Reported on 07/30/2015) 28 patch 0 Not Taking  . NIFEdipine (PROCARDIA XL/ADALAT-CC) 90 MG 24 hr tablet Take 1 tablet (90 mg total) by mouth daily. 30 tablet 2 Taking  . Nutritional Supplements (FEEDING SUPPLEMENT, NEPRO CARB STEADY,) LIQD Take 237 mLs by mouth 2 (two) times daily between meals. (Patient not taking: Reported on 07/30/2015)  0 Not Taking  . omega-3 acid ethyl esters (LOVAZA) 1 G capsule Take 1 capsule (1 g total) by mouth 2 (two) times daily. 60 capsule 2 Taking  . OVER THE COUNTER MEDICATION Apply 1 application topically daily as needed (for shoulder pain).   Not Taking  . pravastatin (PRAVACHOL) 40 MG tablet Take 1 tablet (40 mg total) by mouth every evening. 30 tablet 12 Unknown  . tamsulosin (FLOMAX) 0.4 MG CAPS capsule Take 0.4 mg by mouth 2 (two) times daily.  0 Taking  . terazosin (HYTRIN) 2 MG capsule Take 1 capsule (2 mg total) by mouth at bedtime. (Patient not taking: Reported on 08/07/2015) 30 capsule 3 Not Taking    HOSPITAL MEDICATIONS: . aspirin EC  325 mg Oral Daily  . calcium acetate  667 mg Oral TID WC  . cloNIDine  0.1 mg Oral BID  . clopidogrel  75 mg Oral Daily  . feeding supplement (NEPRO CARB STEADY)  237 mL Oral BID BM  . finasteride  5 mg Oral Daily  . gabapentin  100 mg Oral TID  . hydrALAZINE  50 mg Oral TID  . [START ON 08/14/2015] Influenza vac split quadrivalent PF  0.5 mL Intramuscular  Tomorrow-1000  . metoprolol tartrate  12.5 mg Oral BID  . multivitamin  1 tablet Oral QHS  . nicotine  7 mg Transdermal Daily  . NIFEdipine  90 mg Oral Daily  . nitroGLYCERIN  1 inch Topical 4 times per day  . omega-3 acid ethyl esters  1 g Oral BID  . pantoprazole  40 mg Oral Daily  . [START ON 08/14/2015] pneumococcal 23 valent vaccine  0.5 mL Intramuscular Tomorrow-1000  . pravastatin  40 mg Oral QPM  . sodium chloride  3 mL Intravenous Q12H  . sodium chloride  3 mL Intravenous Q12H  . tamsulosin  0.4 mg Oral BID  . terazosin  2 mg Oral QHS     VITALS: Blood pressure 130/62, pulse 67, temperature 97.8 F (36.6 C), temperature source Oral, resp. rate 18, height 5\' 10"  (1.778  m), weight 133 lb 14.4 oz (60.737 kg), SpO2 100 %.  PHYSICAL EXAM:  General: Alert, oriented x3, no distress Head: no evidence of trauma, PERRL, EOMI, no exophtalmos or lid lag, no myxedema, no xanthelasma; normal ears, nose and oropharynx Neck: normal jugular venous pulsations and no hepatojugular reflux; brisk carotid pulses without delay and no carotid bruits Chest: clear to auscultation, no signs of consolidation by percussion or palpation, normal fremitus, symmetrical and full respiratory excursions Cardiovascular: normal position and quality of the apical impulse, regular rhythm, normal first heart sound and normal second heart sound, no rubs, +ve S4 gallop, no murmur Abdomen: no tenderness or distention, no masses by palpation, no abnormal pulsatility or arterial bruits, normal bowel sounds, no hepatosplenomegaly Extremities: no clubbing, cyanosis;  no edema; 2+ radial, ulnar and brachial pulses bilaterally; 2+ right femoral, absent posterior tibial and dorsalis pedis pulses; 2+ left femoral, absent posterior tibial and dorsalis pedis pulses; no subclavian or femoral bruits Left upper arm AV fistula bruit/thrill. Neurological: mild chronic right hemiparesis   LABS  CBC  Recent Labs  08/12/15 1819  08/13/15 0413  WBC 4.5 4.1  HGB 10.3* 8.6*  HCT 29.7* 24.9*  MCV 88.9 87.1  PLT 141* [id number]   Basic Metabolic Panel  Recent Labs  08/12/15 1819 08/13/15 0413  NA 140 138  K 3.9 3.9  CL 103 102  CO2 26 26  GLUCOSE 84 96  BUN 26* 33*  CREATININE 7.90* 8.71*  CALCIUM 9.6 8.8*   Liver Function Tests No results for input(s): AST, ALT, ALKPHOS, BILITOT, PROT, ALBUMIN in the last 72 hours. No results for input(s): LIPASE, AMYLASE in the last 72 hours. Cardiac Enzymes  Recent Labs  08/12/15 2235 08/13/15 0413  TROPONINI 0.27* 0.49*   ECHO - Left ventricle: The cavity size was normal. Wall thickness was increased in a pattern of moderate LVH. The estimated ejection fraction was 50%. Diffuse hypokinesis. Doppler parameters areconsistent with abnormal left ventricular relaxation (grade 1 diastolic dysfunction). - Aortic valve: There was no stenosis. - Mitral valve: Mildly calcified annulus. There was no significantregurgitation. - Right ventricle: The cavity size was normal. Systolic functionwas normal. - Pulmonary arteries: No complete TR doppler jet so unable toestimate PA systolic pressure. - Inferior vena cava: The vessel was normal in size. Therespirophasic diameter changes were in the normal range (= 50%), consistent with normal central venous pressure.  IMAGING: Dg Chest 2 View  08/12/2015   CLINICAL DATA:  Chest pain. LEFT-sided chest pain with nausea and shortness of breath for 3 hours.  EXAM: CHEST  2 VIEW  COMPARISON:  Multiple priors dating back to 2013.  FINDINGS: The cardiopericardial silhouette is enlarged. Tortuous thoracic aorta with arch atherosclerosis. No airspace disease or pleural effusion. Prominent nipple shadow projects over the RIGHT lung base.  IMPRESSION: Cardiomegaly without failure.   Electronically Signed   By: Dereck Ligas M.D.   On: 08/12/2015 18:39    ECG: NSR, LVH with repol changes  TELEMETRY: NSR  IMPRESSION:  1. CAD -  Small troponin release may represent a NSTEMI, but more likely was due to demand mismatch in the setting of elevated BP (clonidine rebound?). His coronary angiogram one year ago showed relatively low risk findngs. Repeat coronary angio is not risk-free in this patient with evidence of severe lower extremity PAD, dialysis access problems and previous stroke. Prefer conservative evaluation with a nuclear perfusion study and cath only if there is a large and reversible perfusion defect.  2. Severe HTN -  clonidine may not be a great choice for him if he is noncompliant. May get a better reduction in BP with carvedilol rather than metoprolol - will switch and titrate up  As allowed by BP. Hopefully can stop clonidine.  3. PAD/ AAA - note absent pedal pulses and moderately reduced bilateral ABI. No overt limb threat at this time.  4. Cardiomyopathy - markedly improved with time, last EF 50%, consistent with recovering hypertensive heart disease. No acute HF signs. Previous CHF hospitalization was associated with noncompliance ("Admitted 07/06/14 after he skipped a couple days of meds and dialysis").  5. ESRD on HD - MWF  RECOMMENDATION: 1. Lexiscan Myoview in AM (he has already had breakfast) 2. Switch metoprolol to carvedilol and try to wean off clonidine 3. Continue ASA, clopidogrel, statin 4. Stop smoking  Time Spent Directly with Patient: 60 minutes  Sanda Klein, MD, [facility] [phone] office [phone] pager   08/13/2015, 8:29 AM

## 2015-08-13 NOTE — Consult Note (Signed)
[facility] Renal Consultation Note    Indication for Consultation:  Management of ESRD/hemodialysis; anemia, hypertension/volume and secondary hyperparathyroidism PCP:  HPI: Trevor [name] is a 69 y.o. male with ESRD (MWF [place]) presumed secondary to HTN  With NICM, hx CVA, noncobstructive CAD, PAD (ABI 0.68 on right and 0.76 on the left), hx Hep C + and cocaine abuse, right leg numbness and gait instability presented to the ED with chest pain/heaviness and SOB x 3 hours without N, V or diaphoresis.  Initial troponin was 0.09 with ST depressions. He was admitted for observation and cardiology consult.  He was scheduled for Myoview today but ate breakfast. Cards recommended changing MTP to coreg and wean off clonidine, continue asa, plavix and statin and continued recomendation for smoking cessation.  Currently he has no CP. He has mild SOB at rest. Appetite is fair. Has chronic constipation and right leg numbness and instability- takes MOM and stool softeners. Buys all of his food at [employer], a small grocery in [place] which he get to via walking with his walker.  He is due for Hd today  Past Medical History  Diagnosis Date  . HTN (hypertension)   . Anxiety   . Cardiomyopathy   . Renal insufficiency   . Myocardial infarction   . CVA (cerebral infarction)   . Chest pain   . Shortness of breath   . Headache(784.0)   . Arthritis   . Blindness    Past Surgical History  Procedure Laterality Date  . Tee without cardioversion  10/09/2011    Procedure: TRANSESOPHAGEAL ECHOCARDIOGRAM (TEE);  Surgeon: Lelon Perla, MD;  Location: [facility] ENDOSCOPY;  Service: Cardiovascular;  Laterality: N/A;  . Right hand    . Insertion of dialysis catheter  09/21/2012    Procedure: INSERTION OF DIALYSIS CATHETER;  Surgeon: Angelia Mould, MD;  Location: [place];  Service: Vascular;  Laterality: N/A;  Right Internal Jugular Placement  . Left heart catheterization with coronary angiogram N/A  09/28/2012    Procedure: LEFT HEART CATHETERIZATION WITH CORONARY ANGIOGRAM;  Surgeon: Sherren Mocha, MD;  Location: [facility] CATH LAB;  Service: Cardiovascular;  Laterality: N/A;  . Left heart catheterization with coronary angiogram N/A 07/07/2014    Procedure: LEFT HEART CATHETERIZATION WITH CORONARY ANGIOGRAM;  Surgeon: Leonie Man, MD;  Location: [facility] CATH LAB;  Service: Cardiovascular;  Laterality: N/A;   Family History  Problem Relation Age of Onset  . Heart attack Mother     MI in her 33s  . Diabetes Mother   . Anesthesia problems Neg Hx   . Hypotension Neg Hx   . Malignant hyperthermia Neg Hx   . Pseudochol deficiency Neg Hx   . Alcohol abuse Father    Social History:  reports that he has been smoking Cigarettes.  He has a 25 pack-year smoking history. He has never used smokeless tobacco. He reports that he does not drink alcohol or use illicit drugs. No Known Allergies Prior to Admission medications   Medication Sig Start Date End Date Taking? Authorizing Provider  B Complex-C-Folic Acid (DIALYVITE TABLET) TABS Take 1 tablet by mouth daily. 09/28/12  Yes Sorin June Leap, MD  calcium acetate (PHOSLO) 667 MG capsule Take 667 mg by mouth 3 (three) times daily with meals.    Historical Provider, MD  cloNIDine (CATAPRES) 0.1 MG tablet Take 0.1 mg by mouth 2 (two) times daily. 03/28/15   Historical Provider, MD  clopidogrel (PLAVIX) 75 MG tablet Take 1 tablet (75 mg  total) by mouth daily. 04/05/15   Barton Dubois, MD  finasteride (PROSCAR) 5 MG tablet Take 5 mg by mouth daily. 03/28/15   Historical Provider, MD  gabapentin (NEURONTIN) 100 MG capsule Take 1 capsule (100 mg total) by mouth 3 (three) times daily. 04/05/15   Barton Dubois, MD  hydrALAZINE (APRESOLINE) 50 MG tablet Take 1 tablet (50 mg total) by mouth 3 (three) times daily. 04/05/15   Barton Dubois, MD  metoprolol tartrate (LOPRESSOR) 25 MG tablet Take 0.5 tablets (12.5 mg total) by mouth 2 (two) times daily. 04/05/15   Barton Dubois, MD   multivitamin (RENA-VIT) TABS tablet Take 1 tablet by mouth daily. 03/28/15   Historical Provider, MD  nicotine (NICODERM CQ - DOSED IN MG/24 HOURS) 21 mg/24hr patch Place 1 patch (21 mg total) onto the skin daily. Patient not taking: Reported on 07/30/2015 04/05/15   Barton Dubois, MD  NIFEdipine (PROCARDIA XL/ADALAT-CC) 90 MG 24 hr tablet Take 1 tablet (90 mg total) by mouth daily. 04/05/15   Barton Dubois, MD  Nutritional Supplements (FEEDING SUPPLEMENT, NEPRO CARB STEADY,) LIQD Take 237 mLs by mouth 2 (two) times daily between meals. Patient not taking: Reported on 07/30/2015 04/05/15   Barton Dubois, MD  omega-3 acid ethyl esters (LOVAZA) 1 G capsule Take 1 capsule (1 g total) by mouth 2 (two) times daily. 04/05/15   Barton Dubois, MD  OVER THE COUNTER MEDICATION Apply 1 application topically daily as needed (for shoulder pain).    Historical Provider, MD  pravastatin (PRAVACHOL) 40 MG tablet Take 1 tablet (40 mg total) by mouth every evening. 03/06/15   Lelon Perla, MD  tamsulosin (FLOMAX) 0.4 MG CAPS capsule Take 0.4 mg by mouth 2 (two) times daily. 03/28/15   Historical Provider, MD  terazosin (HYTRIN) 2 MG capsule Take 1 capsule (2 mg total) by mouth at bedtime. Patient not taking: Reported on 08/07/2015 09/09/14   Barton Dubois, MD   Current Facility-Administered Medications  Medication Dose Route Frequency Provider Last Rate Last Dose  . 0.9 %  sodium chloride infusion  250 mL Intravenous PRN Theressa Millard, MD      . acetaminophen (TYLENOL) tablet 650 mg  650 mg Oral Q6H PRN Theressa Millard, MD   650 mg at 08/13/15 0858   Or  . acetaminophen (TYLENOL) suppository 650 mg  650 mg Rectal Q6H PRN Theressa Millard, MD      . aspirin EC tablet 325 mg  325 mg Oral Daily Theressa Millard, MD   325 mg at 08/13/15 0859  . calcium acetate (PHOSLO) capsule 667 mg  667 mg Oral TID WC Theressa Millard, MD   667 mg at 08/13/15 1133  . carvedilol (COREG) tablet 6.25 mg  6.25 mg Oral BID WC Mihai  Croitoru, MD   6.25 mg at 08/13/15 0946  . cloNIDine (CATAPRES) tablet 0.1 mg  0.1 mg Oral BID Theressa Millard, MD   0.1 mg at 08/13/15 0859  . clopidogrel (PLAVIX) tablet 75 mg  75 mg Oral Daily Theressa Millard, MD   75 mg at 08/13/15 0859  . [START ON 08/15/2015] doxercalciferol (HECTOROL) injection 2 mcg  2 mcg Intravenous Q M,W,F-HD Alric Seton, PA-C      . feeding supplement (NEPRO CARB STEADY) liquid 237 mL  237 mL Oral BID BM Theressa Millard, MD   237 mL at 08/13/15 1000  . [START ON 08/17/2015] ferric gluconate (NULECIT) 62.5 mg in sodium chloride 0.9 % 100 mL IVPB  62.5 mg Intravenous Q Fri-HD Alric Seton, PA-C      . finasteride (PROSCAR) tablet 5 mg  5 mg Oral Daily Theressa Millard, MD   5 mg at 08/13/15 0859  . gabapentin (NEURONTIN) capsule 100 mg  100 mg Oral TID Theressa Millard, MD   100 mg at 08/13/15 0859  . heparin ADULT infusion 100 units/mL (25000 units/250 mL)  1,100 Units/hr Intravenous Continuous Laren Everts, RPH 11 mL/hr at 08/13/15 0751 1,100 Units/hr at 08/13/15 0751  . hydrALAZINE (APRESOLINE) injection 10 mg  10 mg Intravenous Q6H PRN Theressa Millard, MD      . hydrALAZINE (APRESOLINE) tablet 50 mg  50 mg Oral TID Theressa Millard, MD   50 mg at 08/13/15 0859  . HYDROmorphone (DILAUDID) injection 0.5-1 mg  0.5-1 mg Intravenous Q3H PRN Theressa Millard, MD      . Derrill Memo ON 08/14/2015] Influenza vac split quadrivalent PF (FLUARIX) injection 0.5 mL  0.5 mL Intramuscular Tomorrow-1000 Theressa Millard, MD      . multivitamin (RENA-VIT) tablet 1 tablet  1 tablet Oral QHS Theressa Millard, MD   1 tablet at 08/12/15 2246  . nicotine (NICODERM CQ - dosed in mg/24 hr) patch 7 mg  7 mg Transdermal Daily Theressa Millard, MD   7 mg at 08/13/15 0947  . NIFEdipine (PROCARDIA XL/ADALAT-CC) 24 hr tablet 90 mg  90 mg Oral Daily Theressa Millard, MD   90 mg at 08/13/15 0947  . omega-3 acid ethyl esters (LOVAZA) capsule 1 g  1 g Oral BID Theressa Millard, MD   1 g at 08/13/15 0859  . ondansetron (ZOFRAN) tablet 4 mg  4 mg Oral Q6H PRN Theressa Millard, MD       Or  . ondansetron (ZOFRAN) injection 4 mg  4 mg Intravenous Q6H PRN Theressa Millard, MD      . oxyCODONE (Oxy IR/ROXICODONE) immediate release tablet 5 mg  5 mg Oral Q4H PRN Theressa Millard, MD      . pantoprazole (PROTONIX) EC tablet 40 mg  40 mg Oral Daily Theressa Millard, MD   40 mg at 08/13/15 0859  . [START ON 08/14/2015] pneumococcal 23 valent vaccine (PNU-IMMUNE) injection 0.5 mL  0.5 mL Intramuscular Tomorrow-1000 Harvette C Jenkins, MD      . pravastatin (PRAVACHOL) tablet 40 mg  40 mg Oral QPM Theressa Millard, MD   40 mg at 08/12/15 2348  . sodium chloride 0.9 % injection 3 mL  3 mL Intravenous Q12H Theressa Millard, MD   3 mL at 08/12/15 2252  . sodium chloride 0.9 % injection 3 mL  3 mL Intravenous Q12H Harvette C Jenkins, MD      . sodium chloride 0.9 % injection 3 mL  3 mL Intravenous PRN Theressa Millard, MD      . tamsulosin (FLOMAX) capsule 0.4 mg  0.4 mg Oral BID Theressa Millard, MD   0.4 mg at 08/13/15 0859  . terazosin (HYTRIN) capsule 2 mg  2 mg Oral QHS Theressa Millard, MD   2 mg at 08/12/15 2347   Labs: Basic Metabolic Panel:  Recent Labs Lab 08/12/15 1819 08/13/15 0413  NA 140 138  K 3.9 3.9  CL 103 102  CO2 26 26  GLUCOSE 84 96  BUN 26* 33*  CREATININE 7.90* 8.71*  CALCIUM 9.6 8.8*  CBC:  Recent Labs Lab 08/12/15 1819 08/13/15 0413  WBC 4.5  4.1  HGB 10.3* 8.6*  HCT 29.7* 24.9*  MCV 88.9 87.1  PLT 141* 130*   Cardiac Enzymes:  Recent Labs Lab 08/12/15 2235 08/13/15 0413 08/13/15 0948  TROPONINI 0.27* 0.49* 0.47*   Studies/Results: Dg Chest 2 View  08/12/2015   CLINICAL DATA:  Chest pain. LEFT-sided chest pain with nausea and shortness of breath for 3 hours.  EXAM: CHEST  2 VIEW  COMPARISON:  Multiple priors dating back to 2013.  FINDINGS: The cardiopericardial silhouette is enlarged. Tortuous thoracic  aorta with arch atherosclerosis. No airspace disease or pleural effusion. Prominent nipple shadow projects over the RIGHT lung base.  IMPRESSION: Cardiomegaly without failure.   Electronically Signed   By: Dereck Ligas M.D.   On: 08/12/2015 18:39    ROS: As per HPI otherwise negative.  Physical Exam: Filed Vitals:   08/12/15 2200 08/12/15 2208 08/13/15 0000 08/13/15 0340  BP:  181/76 166/78 130/62  Pulse:  60 70 67  Temp:  97.5 F (36.4 C) 97.7 F (36.5 C) 97.8 F (36.6 C)  TempSrc:  Oral Oral Oral  Resp:   20 18  Height: 5\' 10"  (1.778 m)     Weight: 60.737 kg (133 lb 14.4 oz)     SpO2:  100% 100% 100%     General: Thin frail AA gentleman in NAD Head: Normocephalic, atraumatic, sclera non-icteric, mucus membranes are moist Neck: Supple. JVD not elevated. Lungs:  Few crackles right base. Breathing is unlabored. Heart: RRR with S1 S2.  Abdomen: Soft, non-tender, non-distended with normoactive bowel sounds.  Lower extremities:without edema or ischemic changes, no open wounds  Neuro: Alert and oriented X 3. Moves all extremities spontaneously. Psych:  Responds to questions appropriately with a normal affect. Dialysis Access:left upper AVF + bruit  Dialysis Orders: Center: [facility] 4 hr 180 EDW 59.5 2 K 2.25 Ca profile 4 left upper AVF Hectorol 2 heparin 5900 venofer 50 on Fri and Mircera 50 q 2 weeks last 9/7 Recent labs:  Hgb 10.5 (running 10 - 11) tsat 42% and iPTH 466 Ca/P ok  Assessment/Plan: 1.  Chest pain - cards modifying meds, work up per cards to include Myoview tomorrow 2.  ESRD -  MWF - HD today per routine 3.  Hypertension/volume  - not getting to new EDW - CXR neg for volume, on multiple meds- BPs variable at outpt unit - likely related to compliance with meds- volume status pretty consistent with modest goals 4.  Anemia  - Hgb dropped to 8.6 since admission - repeat Tuesday post HD - if low, need to check FOBT due to 2 gm drop- no ESA net, continue weekly Fe 5.   Metabolic bone disease -  Continue hectorol, increase phoslo to 3 ac 6.  Nutrition - renal carb mod diet/vit - add supplements; RD at outpt HD unit working him.  Intake somewhat limited by inability to get groceries from more than a neighborhood convenience store 7. DM - per primary 8. Mild Thrombocytopenia - follow 9. Right leg weakness with gait instability - PT eval while here to see if there is anything we can do to maximize his functionality - may need additional diagnosistic work up. Dr. Marval Regal had referred to [facility] who did not think sx were related to his PAD 10. Tobacco abuse - encourage cessation  Myriam Jacobson, [facility]hal [facility] Beeper [phone] 08/13/2015, 12:16 PM   Renal Attending: I agree with note articulated above with no corrections at this time.  Will maintain on  routine schedule for dialysis and support as needed.  Breezy Hertenstein C

## 2015-08-14 ENCOUNTER — Observation Stay (HOSPITAL_COMMUNITY): Payer: Medicare Other

## 2015-08-14 DIAGNOSIS — Z79899 Other long term (current) drug therapy: Secondary | ICD-10-CM | POA: Diagnosis not present

## 2015-08-14 DIAGNOSIS — Z9181 History of falling: Secondary | ICD-10-CM | POA: Diagnosis not present

## 2015-08-14 DIAGNOSIS — B1920 Unspecified viral hepatitis C without hepatic coma: Secondary | ICD-10-CM | POA: Diagnosis not present

## 2015-08-14 DIAGNOSIS — Z681 Body mass index (BMI) 19 or less, adult: Secondary | ICD-10-CM | POA: Diagnosis not present

## 2015-08-14 DIAGNOSIS — I10 Essential (primary) hypertension: Secondary | ICD-10-CM | POA: Diagnosis not present

## 2015-08-14 DIAGNOSIS — I428 Other cardiomyopathies: Secondary | ICD-10-CM | POA: Diagnosis not present

## 2015-08-14 DIAGNOSIS — R0789 Other chest pain: Secondary | ICD-10-CM | POA: Diagnosis not present

## 2015-08-14 DIAGNOSIS — Z992 Dependence on renal dialysis: Secondary | ICD-10-CM | POA: Diagnosis not present

## 2015-08-14 DIAGNOSIS — I2510 Atherosclerotic heart disease of native coronary artery without angina pectoris: Secondary | ICD-10-CM | POA: Diagnosis not present

## 2015-08-14 DIAGNOSIS — I4581 Long QT syndrome: Secondary | ICD-10-CM | POA: Diagnosis not present

## 2015-08-14 DIAGNOSIS — I5042 Chronic combined systolic (congestive) and diastolic (congestive) heart failure: Secondary | ICD-10-CM | POA: Diagnosis not present

## 2015-08-14 DIAGNOSIS — E43 Unspecified severe protein-calorie malnutrition: Secondary | ICD-10-CM | POA: Diagnosis not present

## 2015-08-14 DIAGNOSIS — M898X9 Other specified disorders of bone, unspecified site: Secondary | ICD-10-CM | POA: Diagnosis not present

## 2015-08-14 DIAGNOSIS — K5900 Constipation, unspecified: Secondary | ICD-10-CM | POA: Diagnosis not present

## 2015-08-14 DIAGNOSIS — R079 Chest pain, unspecified: Principal | ICD-10-CM

## 2015-08-14 DIAGNOSIS — I714 Abdominal aortic aneurysm, without rupture: Secondary | ICD-10-CM | POA: Diagnosis not present

## 2015-08-14 DIAGNOSIS — I252 Old myocardial infarction: Secondary | ICD-10-CM | POA: Diagnosis not present

## 2015-08-14 DIAGNOSIS — N186 End stage renal disease: Secondary | ICD-10-CM | POA: Diagnosis not present

## 2015-08-14 DIAGNOSIS — F17210 Nicotine dependence, cigarettes, uncomplicated: Secondary | ICD-10-CM | POA: Diagnosis not present

## 2015-08-14 DIAGNOSIS — I739 Peripheral vascular disease, unspecified: Secondary | ICD-10-CM | POA: Diagnosis not present

## 2015-08-14 DIAGNOSIS — H540 Blindness, both eyes: Secondary | ICD-10-CM | POA: Diagnosis not present

## 2015-08-14 DIAGNOSIS — R269 Unspecified abnormalities of gait and mobility: Secondary | ICD-10-CM | POA: Diagnosis not present

## 2015-08-14 DIAGNOSIS — Z8673 Personal history of transient ischemic attack (TIA), and cerebral infarction without residual deficits: Secondary | ICD-10-CM | POA: Diagnosis not present

## 2015-08-14 DIAGNOSIS — E1122 Type 2 diabetes mellitus with diabetic chronic kidney disease: Secondary | ICD-10-CM | POA: Diagnosis not present

## 2015-08-14 DIAGNOSIS — D631 Anemia in chronic kidney disease: Secondary | ICD-10-CM | POA: Diagnosis not present

## 2015-08-14 DIAGNOSIS — D696 Thrombocytopenia, unspecified: Secondary | ICD-10-CM | POA: Diagnosis not present

## 2015-08-14 DIAGNOSIS — I132 Hypertensive heart and chronic kidney disease with heart failure and with stage 5 chronic kidney disease, or end stage renal disease: Secondary | ICD-10-CM | POA: Diagnosis not present

## 2015-08-14 DIAGNOSIS — N2581 Secondary hyperparathyroidism of renal origin: Secondary | ICD-10-CM | POA: Diagnosis not present

## 2015-08-14 DIAGNOSIS — Z7902 Long term (current) use of antithrombotics/antiplatelets: Secondary | ICD-10-CM | POA: Diagnosis not present

## 2015-08-14 LAB — CBC
HCT: 27.2 % — ABNORMAL LOW (ref 39.0–52.0)
Hemoglobin: 9.3 g/dL — ABNORMAL LOW (ref 13.0–17.0)
MCH: 30 pg (ref 26.0–34.0)
MCHC: 34.2 g/dL (ref 30.0–36.0)
MCV: 87.7 fL (ref 78.0–100.0)
Platelets: 141 10*3/uL — ABNORMAL LOW (ref 150–400)
RBC: 3.1 MIL/uL — ABNORMAL LOW (ref 4.22–5.81)
RDW: 13.6 % (ref 11.5–15.5)
WBC: 3.5 10*3/uL — ABNORMAL LOW (ref 4.0–10.5)

## 2015-08-14 LAB — NM MYOCAR MULTI W/SPECT W/WALL MOTION / EF
CHL CUP NUCLEAR SSS: 13
LHR: 0.31
LV dias vol: 131 mL
LVSYSVOL: 106 mL
NUC STRESS TID: 1.23
SDS: 3
SRS: 10

## 2015-08-14 LAB — RENAL FUNCTION PANEL
Albumin: 2.9 g/dL — ABNORMAL LOW (ref 3.5–5.0)
Anion gap: 9 (ref 5–15)
BUN: 21 mg/dL — ABNORMAL HIGH (ref 6–20)
CO2: 24 mmol/L (ref 22–32)
Calcium: 8.6 mg/dL — ABNORMAL LOW (ref 8.9–10.3)
Chloride: 102 mmol/L (ref 101–111)
Creatinine, Ser: 5.61 mg/dL — ABNORMAL HIGH (ref 0.61–1.24)
GFR calc Af Amer: 11 mL/min — ABNORMAL LOW (ref >60)
GFR calc non Af Amer: 9 mL/min — ABNORMAL LOW (ref >60)
Glucose, Bld: 130 mg/dL — ABNORMAL HIGH (ref 65–99)
Phosphorus: 3.2 mg/dL (ref 2.5–4.6)
Potassium: 4.4 mmol/L (ref 3.5–5.1)
Sodium: 135 mmol/L (ref 135–145)

## 2015-08-14 LAB — GLUCOSE, CAPILLARY: GLUCOSE-CAPILLARY: 81 mg/dL (ref 65–99)

## 2015-08-14 LAB — HEPARIN LEVEL (UNFRACTIONATED): Heparin Unfractionated: 1.07 IU/mL — ABNORMAL HIGH (ref 0.30–0.70)

## 2015-08-14 MED ORDER — HEPARIN SODIUM (PORCINE) 1000 UNIT/ML DIALYSIS: 1000.0000 [IU] | INTRAMUSCULAR | Status: DC | PRN

## 2015-08-14 MED ORDER — SODIUM CHLORIDE 0.9 % IV SOLN: 100.0000 mL | INTRAVENOUS | Status: DC | PRN

## 2015-08-14 MED ORDER — HEPARIN SODIUM (PORCINE) 1000 UNIT/ML DIALYSIS: 20.0000 [IU]/kg | INTRAMUSCULAR | Status: DC | PRN

## 2015-08-14 MED ORDER — LIDOCAINE HCL (PF) 1 % IJ SOLN: 5.0000 mL | INTRAMUSCULAR | Status: DC | PRN

## 2015-08-14 MED ORDER — ALTEPLASE 2 MG IJ SOLR: 2.0000 mg | Freq: Once | INTRAMUSCULAR | Status: DC | PRN

## 2015-08-14 MED ORDER — SODIUM CHLORIDE 0.9 % IJ SOLN
80.0000 mg | INTRAVENOUS | Status: AC
  Administered 2015-08-14: 80 mg via INTRAVENOUS

## 2015-08-14 MED ORDER — LIDOCAINE-PRILOCAINE 2.5-2.5 % EX CREA: 1.0000 "application " | TOPICAL_CREAM | CUTANEOUS | Status: DC | PRN

## 2015-08-14 MED ORDER — REGADENOSON 0.4 MG/5ML IV SOLN
INTRAVENOUS | Status: AC
  Administered 2015-08-14: 0.4 mg via INTRAVENOUS
  Filled 2015-08-14: qty 5

## 2015-08-14 MED ORDER — TECHNETIUM TC 99M SESTAMIBI GENERIC - CARDIOLITE
30.0000 | Freq: Once | INTRAVENOUS | Status: AC | PRN
Start: 2015-08-14 — End: 2015-08-14
  Administered 2015-08-14: 30 via INTRAVENOUS

## 2015-08-14 MED ORDER — TECHNETIUM TC 99M SESTAMIBI GENERIC - CARDIOLITE
10.0000 | Freq: Once | INTRAVENOUS | Status: AC | PRN
Start: 2015-08-14 — End: 2015-08-14
  Administered 2015-08-14: 10 via INTRAVENOUS

## 2015-08-14 MED ORDER — REGADENOSON 0.4 MG/5ML IV SOLN
0.4000 mg | Freq: Once | INTRAVENOUS | Status: AC
  Administered 2015-08-14: 0.4 mg via INTRAVENOUS
  Filled 2015-08-14: qty 5

## 2015-08-14 MED ORDER — PENTAFLUOROPROP-TETRAFLUOROETH EX AERO: 1.0000 "application " | INHALATION_SPRAY | CUTANEOUS | Status: DC | PRN

## 2015-08-14 NOTE — Progress Notes (Signed)
ANTICOAGULATION CONSULT NOTE - Follow Up Consult  Pharmacy Consult for Heparin Indication: chest pain/ACS  No Known Allergies  Labs:  Recent Labs  08/12/15 1819 08/12/15 2235 08/13/15 0413 08/13/15 0623 08/13/15 0948 08/13/15 1510 08/14/15 1210  HGB 10.3*  --  8.6*  --   --   --   --   HCT 29.7*  --  24.9*  --   --   --   --   PLT 141*  --  130*  --   --   --   --   APTT  --  37  --   --   --   --   --   LABPROT  --  14.6  --   --   --   --   --   INR  --  1.12  --   --   --   --   --   HEPARINUNFRC  --   --   --  0.78*  --  0.71* 1.07*  CREATININE 7.90*  --  8.71*  --   --   --   --   TROPONINI  --  0.27* 0.49*  --  0.47*  --   --    Assessment: Heparin level = 1.07 today S/p nuclear stress stest   Goal of Therapy:  Heparin level 0.3-0.7 units/ml Monitor platelets by anticoagulation protocol: Yes   Plan:  Heparin to 750 units / hr Follow up 6 hour heparin level  Thank you Anette Guarneri, PharmD [phone]  ,08/14/2015,1:09 PM

## 2015-08-14 NOTE — Progress Notes (Signed)
Lexiscan MV performed. 1 day study, [place] to read.  Rosaria Ferries, Hershal Coria 08/14/2015 10:16 AM Beeper [phone]

## 2015-08-14 NOTE — Evaluation (Addendum)
Physical Therapy Evaluation Patient Details Name: Trevor [name] MRN: [id number] DOB: 09-05-1946 Today's Date: 08/14/2015   History of Present Illness  69 y.o. male admitted to [facility] on 08/12/15 for chest pain.  Pt's troponin was mildly elevated (thought to be demand ischemia), and BP was very elevated 200s/100s. cardiology workup in progress.  Pt with significant PMHx of HTN, MI, CVA, SOB, blindness, and CKD (on HD).   Clinical Impression  Pt is generally weak and lightheaded when getting up with RW.  He has limited gait distance before he is too weak to continue and has to sit.  He would benefit from [name] placement at discharge if he qualifies as he lives alone and doesn't have assist at discharge.     Follow Up Recommendations [name]    Equipment Recommendations  None recommended by PT    Recommendations for Other Services   NA    Precautions / Restrictions Precautions Precautions: Fall Precaution Comments: unsteady on his feet with right leg weaker than left leg.      Mobility  Bed Mobility Overal bed mobility: Modified Independent             General bed mobility comments: pulling on railing, extra time needed to complete task  Transfers Overall transfer level: Needs assistance   Transfers: Sit to/from Stand Sit to Stand: Min assist         General transfer comment: Min assist to support trunk during transition to stand from bed.  Pt shaky over knees and reports lightheadedness in standing.   Ambulation/Gait Ambulation/Gait assistance: Min assist;+2 safety/equipment (chair to follow for safety) Ambulation Distance (Feet): 65 Feet (2 seated rest breaks) Assistive device: Rolling walker (2 wheeled) Gait Pattern/deviations: Step-through pattern;Trendelenburg Gait velocity: decreased Gait velocity interpretation: Below normal speed for age/gender General Gait Details: right trendelenberg gait pattern, decreased foot clearance bil, no buckling or hyper extension moment  at right knee despite weakness with MMT.          Balance Overall balance assessment: Needs assistance Sitting-balance support: Feet supported;Bilateral upper extremity supported Sitting balance-Leahy Scale: Fair     Standing balance support: Bilateral upper extremity supported Standing balance-Leahy Scale: Poor                               Pertinent Vitals/Pain Pain Assessment: 0-10 Pain Score: 3  Pain Location: generalized achiness in bil arms Pain Descriptors / Indicators: Aching Pain Intervention(s): Limited activity within patient's tolerance;Monitored during session;Repositioned    Home Living Family/patient expects to be discharged to:: Private residence Living Arrangements: Alone   Type of Home: House Home Access: Stairs to enter Entrance Stairs-Rails: None Entrance Stairs-Number of Steps: 3 Home Layout: One level Home Equipment: Cane - single point;Walker - 4 wheels Additional Comments: puts a regular chair in the shower to use if need to sit down    Prior Function Level of Independence: Independent with assistive device(s)               Hand Dominance   Dominant Hand: Right    Extremity/Trunk Assessment   Upper Extremity Assessment: Overall WFL for tasks assessed           Lower Extremity Assessment: RLE deficits/detail;LLE deficits/detail RLE Deficits / Details: generalized weakness, weaker than left leg at 3+/5 throughout.  LLE Deficits / Details: generalized weakness, MMT 3+ to 4/5 throughout  Cervical / Trunk Assessment: Normal  Communication      Cognition  Arousal/Alertness: Lethargic Behavior During Therapy: WFL for tasks assessed/performed Overall Cognitive Status: Within Functional Limits for tasks assessed                               Assessment/Plan    PT Assessment Patient needs continued PT services  PT Diagnosis Difficulty walking;Abnormality of gait;Generalized weakness   PT Problem List  Decreased strength;Decreased activity tolerance;Decreased balance;Decreased mobility;Decreased knowledge of use of DME;Cardiopulmonary status limiting activity  PT Treatment Interventions DME instruction;Gait training;Functional mobility training;Therapeutic activities;Therapeutic exercise;Balance training;Neuromuscular re-education;Patient/family education;Stair training   PT Goals (Current goals can be found in the Care Plan section) Acute Rehab PT Goals Patient Stated Goal: to get stronger before he goes home PT Goal Formulation: With patient Time For Goal Achievement: 08/28/15 Potential to Achieve Goals: Good    Frequency Min 3X/week   Barriers to discharge Decreased caregiver support pt lives alone       End of Session Equipment Utilized During Treatment: Gait belt Activity Tolerance: Patient limited by fatigue Patient left: in chair;with call bell/phone within reach;with chair alarm set Nurse Communication: Mobility status    Functional Assessment Tool Used: assist level Functional Limitation: Mobility: Walking and moving around Mobility: Walking and Moving Around Current Status [phone]): At least 20 percent but less than 40 percent impaired, limited or restricted Mobility: Walking and Moving Around Goal Status [phone]): At least 1 percent but less than 20 percent impaired, limited or restricted    Time: 1532-1609 PT Time Calculation (min) (ACUTE ONLY): 37 min   Charges:   PT Evaluation $Initial PT Evaluation Tier I: 1 Procedure PT Treatments $Gait Training: 8-22 mins   PT G Codes:   PT G-Codes **NOT FOR INPATIENT CLASS** Functional Assessment Tool Used: assist level Functional Limitation: Mobility: Walking and moving around Mobility: Walking and Moving Around Current Status [id number] At least 20 percent but less than 40 percent impaired, limited or restricted Mobility: Walking and Moving Around Goal Status [phone]): At least 1 percent but less than 20 percent impaired,  limited or restricted    Trevor [name] B. [place], [place], DPT [phone]   08/14/2015, 5:42 PM

## 2015-08-14 NOTE — Progress Notes (Signed)
[facility] PROGRESS NOTE  Trevor [name] [id number] DOB: [birth year] DOA: 08/12/2015 PCP: [name], MD  Assessment/Plan:  Principal Problem:   Chest pain with minimally elevated trop. MV without inducible ischemia, but EF 19% and fixed defect. Last echo 5/16 with EF 50%. D/w Dr. Radford Pax. She rec checking echo for LV EF Active Problems:   Tobacco use disorder: Counseled against   History of stroke   Chronic combined systolic and diastolic CHF (congestive heart failure): Due for dialysis today. Discussed with Dr. Florene Glen.   Anemia   ESRD on hemodialysis: nephrology consulted   Protein-calorie malnutrition, severe   CAD (coronary artery disease)   Malignant hypertension: Improved.  HPI/Subjective: No CP. No HA. No dyspnea  Objective: Filed Vitals:   08/14/15 1318  BP: 144/54  Pulse: 55  Temp: 98.4 F (36.9 C)  Resp: 16    Intake/Output Summary (Last 24 hours) at 08/14/15 1508 Last data filed at 08/14/15 0128  Gross per 24 hour  Intake 285.33 ml  Output    800 ml  Net -514.67 ml   Filed Weights   08/12/15 1719 08/12/15 2200 08/14/15 0111  Weight: 60.328 kg (133 lb) 60.737 kg (133 lb 14.4 oz) 62.732 kg (138 lb 4.8 oz)    Exam:   General:  Alert, oriented. Comfortable.  Cardiovascular: Regular rate rhythm without murmurs gallops rubs  Respiratory: Clear to auscultation bilaterally without wheezes rhonchi or rales  Abdomen: Soft nontender nondistended  Ext: AV fistula on left upper extremity with thrill. No peripheral edema.  Basic Metabolic Panel:  Recent Labs Lab 08/12/15 1819 08/13/15 0413  NA 140 138  K 3.9 3.9  CL 103 102  CO2 26 26  GLUCOSE 84 96  BUN 26* 33*  CREATININE 7.90* 8.71*  CALCIUM 9.6 8.8*   Liver Function Tests: No results for input(s): AST, ALT, ALKPHOS, BILITOT, PROT, ALBUMIN in the last 168 hours. No results for input(s): LIPASE, AMYLASE in the last 168 hours. No results for input(s): AMMONIA in the last 168  hours. CBC:  Recent Labs Lab 08/12/15 1819 08/13/15 0413  WBC 4.5 4.1  HGB 10.3* 8.6*  HCT 29.7* 24.9*  MCV 88.9 87.1  PLT 141* 130*   Cardiac Enzymes:  Recent Labs Lab 08/12/15 2235 08/13/15 0413 08/13/15 0948  TROPONINI 0.27* 0.49* 0.47*   BNP (last 3 results)  Recent Labs  01/08/15 1929  BNP >4500.0*    ProBNP (last 3 results) No results for input(s): PROBNP in the last 8760 hours.  CBG: No results for input(s): GLUCAP in the last 168 hours.  No results found for this or any previous visit (from the past 240 hour(s)).   Studies: Dg Chest 2 View  08/12/2015   CLINICAL DATA:  Chest pain. LEFT-sided chest pain with nausea and shortness of breath for 3 hours.  EXAM: CHEST  2 VIEW  COMPARISON:  Multiple priors dating back to 2013.  FINDINGS: The cardiopericardial silhouette is enlarged. Tortuous thoracic aorta with arch atherosclerosis. No airspace disease or pleural effusion. Prominent nipple shadow projects over the RIGHT lung base.  IMPRESSION: Cardiomegaly without failure.   Electronically Signed   By: Dereck Ligas M.D.   On: 08/12/2015 18:39   Nm Myocar Multi W/spect W/wall Motion / Ef  08/14/2015   CLINICAL DATA:  Chest pain  EXAM: MYOCARDIAL IMAGING WITH SPECT (REST AND PHARMACOLOGIC-STRESS)  GATED LEFT VENTRICULAR WALL MOTION STUDY  LEFT VENTRICULAR EJECTION FRACTION  TECHNIQUE: Standard myocardial SPECT imaging was performed after resting intravenous injection of  10 mCi Tc-81m sestamibi. Subsequently, intravenous infusion of Lexiscan was performed under the supervision of the Cardiology staff. At peak effect of the drug, 30 mCi Tc-95m sestamibi was injected intravenously and standard myocardial SPECT imaging was performed. Quantitative gated imaging was also performed to evaluate left ventricular wall motion, and estimate left ventricular ejection fraction.  COMPARISON:  None.  FINDINGS: Perfusion: No decreased activity in the left ventricle on stress imaging to  suggest reversible ischemia or infarction. Small to medium sized, mild severity, fixed defect in the inferior wall of the left ventricle.  Wall Motion: Hypokinesis of the inferolateral wall. No left ventricular dilation.  Left Ventricular Ejection Fraction: 19 %  End diastolic volume [id number] ml  End systolic volume [id number] ml  IMPRESSION: 1. No reversible ischemia or infarction. Fixed defect along the inferior wall of the left ventricle, as described above, suggesting prior myocardial infarction.  2. Hypokinesis of the inferolateral wall of the left ventricle.  3. Left ventricular ejection fraction 19%  4. High-risk stress test findings*.  *2012 Appropriate Use Criteria for Coronary Revascularization Focused Update: J Am Coll Cardiol. [id number]. http://content.airportbarriers.com.aspx?articleid=1201161   Electronically Signed   By: Julian Hy M.D.   On: 08/14/2015 12:38   EKG tracing reviewed. LVH with strain, nsr  Scheduled Meds: . aspirin EC  325 mg Oral Daily  . calcium acetate  2,001 mg Oral TID WC  . carvedilol  6.25 mg Oral BID WC  . cloNIDine  0.1 mg Oral BID  . clopidogrel  75 mg Oral Daily  . [START ON 08/15/2015] doxercalciferol  2 mcg Intravenous Q M,W,F-HD  . feeding supplement (NEPRO CARB STEADY)  237 mL Oral BID BM  . [START ON 08/17/2015] ferric gluconate (FERRLECIT/NULECIT) IV  62.5 mg Intravenous Q Fri-HD  . finasteride  5 mg Oral Daily  . gabapentin  100 mg Oral TID  . hydrALAZINE  50 mg Oral TID  . Influenza vac split quadrivalent PF  0.5 mL Intramuscular Tomorrow-1000  . multivitamin  1 tablet Oral QHS  . nicotine  7 mg Transdermal Daily  . NIFEdipine  90 mg Oral Daily  . omega-3 acid ethyl esters  1 g Oral BID  . pantoprazole  40 mg Oral Daily  . pneumococcal 23 valent vaccine  0.5 mL Intramuscular Tomorrow-1000  . pravastatin  40 mg Oral QPM  . sodium chloride  3 mL Intravenous Q12H  . sodium chloride  3 mL Intravenous Q12H  . tamsulosin  0.4 mg Oral BID  .  terazosin  2 mg Oral QHS   Continuous Infusions: . heparin 750 Units/hr (08/14/15 1312)    Time spent: 35 minutes  [facility] [email or web address], password [facility] 08/14/2015, 3:08 PM

## 2015-08-14 NOTE — Progress Notes (Signed)
Patient Name: Trevor [name] Date of Encounter: 08/14/2015  Principal Problem:   Chest pain Active Problems:   Tobacco use disorder   History of stroke   Chronic combined systolic and diastolic CHF (congestive heart failure)   Anemia   ESRD on hemodialysis   Protein-calorie malnutrition, severe   CAD (coronary artery disease)   Malignant hypertension   Primary Cardiologist: Dr Stanford Breed  Patient Profile: 69 yo male w/ hx mod but non-obs dz by cath w/ FFR 07/2014, ESRD on HD, CVA, remote cocaine use, admitted 09/11 w/ atypical chest pain and BP 216/2013. Trop peak 0.49 but felt demand ischemia, MV recommended.  SUBJECTIVE: No more chest pain, no SOB  OBJECTIVE Filed Vitals:   08/14/15 0621 08/14/15 0634 08/14/15 0719 08/14/15 1001  BP: 101/36 101/60 109/50 113/84  Pulse: 66  64 66  Temp: 98.5 F (36.9 C)  98.5 F (36.9 C)   TempSrc: Oral  Oral   Resp: 18  17   Height:      Weight:      SpO2: 100%  99%     Intake/Output Summary (Last 24 hours) at 08/14/15 1002 Last data filed at 08/14/15 0128  Gross per 24 hour  Intake 505.33 ml  Output    800 ml  Net -294.67 ml   Filed Weights   08/12/15 1719 08/12/15 2200 08/14/15 0111  Weight: 133 lb (60.328 kg) 133 lb 14.4 oz (60.737 kg) 138 lb 4.8 oz (62.732 kg)    PHYSICAL EXAM General: Well developed, well nourished, male in no acute distress. Head: Normocephalic, atraumatic.  Neck: Supple without bruits, JVD minimal elevation Lungs:  Resp regular and unlabored, few rales bases. Heart: RRR, S1, S2, no S3, S4, or murmur; no rub. Abdomen: Soft, non-tender, non-distended, BS + x 4.  Extremities: No clubbing, cyanosis, edema.  Neuro: Alert and oriented X 2. Moves all extremities spontaneously.  LABS: CBC: Recent Labs  08/12/15 1819 08/13/15 0413  WBC 4.5 4.1  HGB 10.3* 8.6*  HCT 29.7* 24.9*  MCV 88.9 87.1  PLT 141* 130*   INR: Recent Labs  08/12/15 2235  INR [id number]   Basic Metabolic Panel: Recent  Labs  08/12/15 1819 08/13/15 0413  NA 140 138  K 3.9 3.9  CL 103 102  CO2 26 26  GLUCOSE 84 96  BUN 26* 33*  CREATININE 7.90* 8.71*  CALCIUM 9.6 8.8*   Cardiac Enzymes: Recent Labs  08/12/15 2235 08/13/15 0413 08/13/15 0948  TROPONINI 0.27* 0.49* 0.47*    Recent Labs  08/12/15 1838  TROPIPOC 0.09*   TELE: SR, seen in nuc med     Radiology/Studies: Dg Chest 2 View  08/12/2015   CLINICAL DATA:  Chest pain. LEFT-sided chest pain with nausea and shortness of breath for 3 hours.  EXAM: CHEST  2 VIEW  COMPARISON:  Multiple priors dating back to 2013.  FINDINGS: The cardiopericardial silhouette is enlarged. Tortuous thoracic aorta with arch atherosclerosis. No airspace disease or pleural effusion. Prominent nipple shadow projects over the RIGHT lung base.  IMPRESSION: Cardiomegaly without failure.   Electronically Signed   By: Dereck Ligas M.D.   On: 08/12/2015 18:39   Current Medications:  . aspirin EC  325 mg Oral Daily  . calcium acetate  2,001 mg Oral TID WC  . carvedilol  6.25 mg Oral BID WC  . cloNIDine  0.1 mg Oral BID  . clopidogrel  75 mg Oral Daily  . [START ON 08/15/2015] doxercalciferol  2  mcg Intravenous Q M,W,F-HD  . feeding supplement (NEPRO CARB STEADY)  237 mL Oral BID BM  . [START ON 08/17/2015] ferric gluconate (FERRLECIT/NULECIT) IV  62.5 mg Intravenous Q Fri-HD  . finasteride  5 mg Oral Daily  . gabapentin  100 mg Oral TID  . hydrALAZINE  50 mg Oral TID  . Influenza vac split quadrivalent PF  0.5 mL Intramuscular Tomorrow-1000  . multivitamin  1 tablet Oral QHS  . nicotine  7 mg Transdermal Daily  . NIFEdipine  90 mg Oral Daily  . omega-3 acid ethyl esters  1 g Oral BID  . pantoprazole  40 mg Oral Daily  . pneumococcal 23 valent vaccine  0.5 mL Intramuscular Tomorrow-1000  . pravastatin  40 mg Oral QPM  . regadenoson      . regadenoson  0.4 mg Intravenous Once  . sodium chloride  3 mL Intravenous Q12H  . sodium chloride  3 mL Intravenous Q12H  .  tamsulosin  0.4 mg Oral BID  . terazosin  2 mg Oral QHS   . heparin 1,000 Units/hr (08/13/15 1702)    ASSESSMENT AND PLAN: Principal Problem:   Chest pain - ez mildly elevated, felt demand ischemia 2nd uncontrolled HTN - MV today, cath if high-risk, large area of ischemia  Otherwise, per IM, Nephrology Active Problems:   Tobacco use disorder   History of stroke   Chronic combined systolic and diastolic CHF (congestive heart failure)   Anemia   ESRD on hemodialysis   Protein-calorie malnutrition, severe   CAD (coronary artery disease)   Malignant hypertension   Signed, Rosaria Ferries , PA-C 10:02 AM 08/14/2015

## 2015-08-15 ENCOUNTER — Observation Stay (HOSPITAL_COMMUNITY): Payer: Medicare Other

## 2015-08-15 DIAGNOSIS — K219 Gastro-esophageal reflux disease without esophagitis: Secondary | ICD-10-CM | POA: Diagnosis not present

## 2015-08-15 DIAGNOSIS — E785 Hyperlipidemia, unspecified: Secondary | ICD-10-CM | POA: Diagnosis not present

## 2015-08-15 DIAGNOSIS — Z681 Body mass index (BMI) 19 or less, adult: Secondary | ICD-10-CM | POA: Diagnosis not present

## 2015-08-15 DIAGNOSIS — I5040 Unspecified combined systolic (congestive) and diastolic (congestive) heart failure: Secondary | ICD-10-CM | POA: Diagnosis not present

## 2015-08-15 DIAGNOSIS — H540 Blindness, both eyes: Secondary | ICD-10-CM | POA: Diagnosis present

## 2015-08-15 DIAGNOSIS — G459 Transient cerebral ischemic attack, unspecified: Secondary | ICD-10-CM | POA: Diagnosis not present

## 2015-08-15 DIAGNOSIS — Z79899 Other long term (current) drug therapy: Secondary | ICD-10-CM | POA: Diagnosis not present

## 2015-08-15 DIAGNOSIS — I739 Peripheral vascular disease, unspecified: Secondary | ICD-10-CM | POA: Diagnosis present

## 2015-08-15 DIAGNOSIS — E1122 Type 2 diabetes mellitus with diabetic chronic kidney disease: Secondary | ICD-10-CM | POA: Diagnosis present

## 2015-08-15 DIAGNOSIS — N186 End stage renal disease: Secondary | ICD-10-CM | POA: Diagnosis not present

## 2015-08-15 DIAGNOSIS — Z992 Dependence on renal dialysis: Secondary | ICD-10-CM | POA: Diagnosis not present

## 2015-08-15 DIAGNOSIS — R072 Precordial pain: Secondary | ICD-10-CM | POA: Diagnosis not present

## 2015-08-15 DIAGNOSIS — N400 Enlarged prostate without lower urinary tract symptoms: Secondary | ICD-10-CM | POA: Diagnosis not present

## 2015-08-15 DIAGNOSIS — K5900 Constipation, unspecified: Secondary | ICD-10-CM | POA: Diagnosis present

## 2015-08-15 DIAGNOSIS — B1920 Unspecified viral hepatitis C without hepatic coma: Secondary | ICD-10-CM | POA: Diagnosis present

## 2015-08-15 DIAGNOSIS — I2510 Atherosclerotic heart disease of native coronary artery without angina pectoris: Secondary | ICD-10-CM | POA: Diagnosis present

## 2015-08-15 DIAGNOSIS — I10 Essential (primary) hypertension: Secondary | ICD-10-CM | POA: Diagnosis not present

## 2015-08-15 DIAGNOSIS — I25118 Atherosclerotic heart disease of native coronary artery with other forms of angina pectoris: Secondary | ICD-10-CM | POA: Diagnosis not present

## 2015-08-15 DIAGNOSIS — I252 Old myocardial infarction: Secondary | ICD-10-CM | POA: Diagnosis not present

## 2015-08-15 DIAGNOSIS — I132 Hypertensive heart and chronic kidney disease with heart failure and with stage 5 chronic kidney disease, or end stage renal disease: Secondary | ICD-10-CM | POA: Diagnosis present

## 2015-08-15 DIAGNOSIS — D696 Thrombocytopenia, unspecified: Secondary | ICD-10-CM | POA: Diagnosis present

## 2015-08-15 DIAGNOSIS — M898X9 Other specified disorders of bone, unspecified site: Secondary | ICD-10-CM | POA: Diagnosis present

## 2015-08-15 DIAGNOSIS — Z9119 Patient's noncompliance with other medical treatment and regimen: Secondary | ICD-10-CM | POA: Diagnosis not present

## 2015-08-15 DIAGNOSIS — Z7902 Long term (current) use of antithrombotics/antiplatelets: Secondary | ICD-10-CM | POA: Diagnosis not present

## 2015-08-15 DIAGNOSIS — I639 Cerebral infarction, unspecified: Secondary | ICD-10-CM | POA: Diagnosis not present

## 2015-08-15 DIAGNOSIS — J9620 Acute and chronic respiratory failure, unspecified whether with hypoxia or hypercapnia: Secondary | ICD-10-CM | POA: Diagnosis not present

## 2015-08-15 DIAGNOSIS — I428 Other cardiomyopathies: Secondary | ICD-10-CM | POA: Diagnosis present

## 2015-08-15 DIAGNOSIS — R079 Chest pain, unspecified: Secondary | ICD-10-CM | POA: Diagnosis not present

## 2015-08-15 DIAGNOSIS — Z794 Long term (current) use of insulin: Secondary | ICD-10-CM | POA: Diagnosis not present

## 2015-08-15 DIAGNOSIS — I714 Abdominal aortic aneurysm, without rupture: Secondary | ICD-10-CM | POA: Diagnosis present

## 2015-08-15 DIAGNOSIS — I4581 Long QT syndrome: Secondary | ICD-10-CM | POA: Diagnosis present

## 2015-08-15 DIAGNOSIS — R262 Difficulty in walking, not elsewhere classified: Secondary | ICD-10-CM | POA: Diagnosis not present

## 2015-08-15 DIAGNOSIS — N189 Chronic kidney disease, unspecified: Secondary | ICD-10-CM | POA: Diagnosis not present

## 2015-08-15 DIAGNOSIS — I509 Heart failure, unspecified: Secondary | ICD-10-CM | POA: Diagnosis not present

## 2015-08-15 DIAGNOSIS — E1129 Type 2 diabetes mellitus with other diabetic kidney complication: Secondary | ICD-10-CM | POA: Diagnosis not present

## 2015-08-15 DIAGNOSIS — F17210 Nicotine dependence, cigarettes, uncomplicated: Secondary | ICD-10-CM | POA: Diagnosis present

## 2015-08-15 DIAGNOSIS — I427 Cardiomyopathy due to drug and external agent: Secondary | ICD-10-CM | POA: Diagnosis not present

## 2015-08-15 DIAGNOSIS — Z9181 History of falling: Secondary | ICD-10-CM | POA: Diagnosis not present

## 2015-08-15 DIAGNOSIS — D631 Anemia in chronic kidney disease: Secondary | ICD-10-CM | POA: Diagnosis not present

## 2015-08-15 DIAGNOSIS — E43 Unspecified severe protein-calorie malnutrition: Secondary | ICD-10-CM | POA: Diagnosis present

## 2015-08-15 DIAGNOSIS — R269 Unspecified abnormalities of gait and mobility: Secondary | ICD-10-CM | POA: Diagnosis present

## 2015-08-15 DIAGNOSIS — I5042 Chronic combined systolic (congestive) and diastolic (congestive) heart failure: Secondary | ICD-10-CM | POA: Diagnosis not present

## 2015-08-15 DIAGNOSIS — N2581 Secondary hyperparathyroidism of renal origin: Secondary | ICD-10-CM | POA: Diagnosis present

## 2015-08-15 DIAGNOSIS — Z8673 Personal history of transient ischemic attack (TIA), and cerebral infarction without residual deficits: Secondary | ICD-10-CM | POA: Diagnosis not present

## 2015-08-15 DIAGNOSIS — G629 Polyneuropathy, unspecified: Secondary | ICD-10-CM | POA: Diagnosis not present

## 2015-08-15 DIAGNOSIS — R0789 Other chest pain: Secondary | ICD-10-CM | POA: Diagnosis not present

## 2015-08-15 DIAGNOSIS — M6281 Muscle weakness (generalized): Secondary | ICD-10-CM | POA: Diagnosis not present

## 2015-08-15 LAB — GLUCOSE, CAPILLARY
Glucose-Capillary: 136 mg/dL — ABNORMAL HIGH (ref 65–99)
Glucose-Capillary: 132 mg/dL — ABNORMAL HIGH (ref 65–99)

## 2015-08-15 MED ORDER — SORBITOL 70 % SOLN: 30.0000 mL | Freq: Every day | Status: DC | PRN

## 2015-08-15 MED ORDER — DOXERCALCIFEROL 4 MCG/2ML IV SOLN
INTRAVENOUS | Status: AC
  Filled 2015-08-15: qty 2

## 2015-08-15 MED ORDER — HEPARIN SODIUM (PORCINE) 5000 UNIT/ML IJ SOLN
5000.0000 [IU] | Freq: Three times a day (TID) | INTRAMUSCULAR | Status: DC
  Administered 2015-08-15 – 2015-08-18 (×10): 5000 [IU] via SUBCUTANEOUS
  Filled 2015-08-15 (×10): qty 1

## 2015-08-15 NOTE — Procedures (Signed)
Tolerating hemodialysis without instability. Trevor [name] C

## 2015-08-15 NOTE — Clinical Social Work Placement (Addendum)
   CLINICAL SOCIAL WORK PLACEMENT  NOTE  Date:  08/15/2015  Patient Details  Name: Trevor [name] MRN: [id number] Date of Birth: Jan 17, 1946  Clinical Social Work is seeking post-discharge placement for this patient at the [place] level of care (*CSW will initial, date and re-position this form in  chart as items are completed):  Yes   Patient/family provided with [facility] Work Department's list of facilities offering this level of care within the geographic area requested by the patient (or if unable, by the patient's family).  Yes   Patient/family informed of their freedom to choose among providers that offer the needed level of care, that participate in Medicare, Medicaid or managed care program needed by the patient, have an available bed and are willing to accept the patient.  Yes   Patient/family informed of [facility]'s ownership interest in [facility] and [facility], as well as of the fact that they are under no obligation to receive care at these facilities.  PASRR submitted to EDS on       PASRR number received on       Existing PASRR number confirmed on 08/15/15     FL2 transmitted to all facilities in geographic area requested by pt/family on 08/15/15     FL2 transmitted to all facilities within larger geographic area on       Patient informed that his/her managed care company has contracts with or will negotiate with certain facilities, including the following:            Patient/family informed of bed offers received. 08/17/15  Patient chooses bed at [facility]       Physician recommends and patient chooses bed at      Patient to be transferred to   on  .  Patient to be transferred to facility by     EMS  Patient family notified on   08/18/2015  of transfer.  Name of family member notified:     [name]   PHYSICIAN  Additional Comment:    _______________________________________________ Eduard Clos, MSW,  LCSWA

## 2015-08-15 NOTE — Progress Notes (Signed)
[facility] PROGRESS NOTE  MARTEZ WURZER [id number] DOB: 02/07/1946 DOA: 08/12/2015 PCP: [name], MD  Assessment/Plan:  Principal Problem:   Chest pain with minimally elevated trop. MV without inducible ischemia, but EF 19% and fixed defect. Last echo 5/16 with EF 50%. D/w Dr. Radford Pax. She rec checking echo for LV EF. Echo today shows NORMAL EF. MV an underestimate. Stable for discharge, but now the plan is for pt to go to [name].  Active Problems:   Tobacco use disorder: Counseled against   History of stroke   Chronic combined systolic and diastolic CHF (congestive heart failure): EF now normal.    Anemia   ESRD on hemodialysis: nephrology consulted   Protein-calorie malnutrition, severe   CAD (coronary artery disease)   Malignant hypertension: Improved.  HPI/Subjective: No CP. No HA. No dyspnea  Objective: Filed Vitals:   08/15/15 1604  BP: 158/60  Pulse:   Temp:   Resp:     Intake/Output Summary (Last 24 hours) at 08/15/15 1645 Last data filed at 08/15/15 1120  Gross per 24 hour  Intake      0 ml  Output   1000 ml  Net  -1000 ml   Filed Weights   08/15/15 0456 08/15/15 0718 08/15/15 1120  Weight: 63.64 kg (140 lb 4.8 oz) 62.9 kg (138 lb 10.7 oz) 61.9 kg (136 lb 7.4 oz)    Exam:   General:  Alert, oriented. Comfortable.  Cardiovascular: Regular rate rhythm without murmurs gallops rubs  Respiratory: Clear to auscultation bilaterally without wheezes rhonchi or rales  Abdomen: Soft nontender nondistended  Ext: AV fistula on left upper extremity with thrill. No peripheral edema.  Basic Metabolic Panel:  Recent Labs Lab 08/12/15 1819 08/13/15 0413 08/14/15 1800  NA 140 138 135  K 3.9 3.9 4.4  CL 103 102 102  CO2 26 26 24   GLUCOSE 84 96 130*  BUN 26* 33* 21*  CREATININE 7.90* 8.71* 5.61*  CALCIUM 9.6 8.8* 8.6*  PHOS  --   --  3.2   Liver Function Tests:  Recent Labs Lab 08/14/15 1800  ALBUMIN 2.9*   No results for  input(s): LIPASE, AMYLASE in the last 168 hours. No results for input(s): AMMONIA in the last 168 hours. CBC:  Recent Labs Lab 08/12/15 1819 08/13/15 0413 08/14/15 1800  WBC 4.5 4.1 3.5*  HGB 10.3* 8.6* 9.3*  HCT 29.7* 24.9* 27.2*  MCV 88.9 87.1 87.7  PLT 141* 130* 141*   Cardiac Enzymes:  Recent Labs Lab 08/12/15 2235 08/13/15 0413 08/13/15 0948  TROPONINI 0.27* 0.49* 0.47*   BNP (last 3 results)  Recent Labs  01/08/15 1929  BNP >4500.0*    ProBNP (last 3 results) No results for input(s): PROBNP in the last 8760 hours.  CBG:  Recent Labs Lab 08/14/15 2035 08/15/15 1205 08/15/15 1616  GLUCAP 81 136* 132*    No results found for this or any previous visit (from the past 240 hour(s)).   Studies: Nm Myocar Multi W/spect W/wall Motion / Ef  08/14/2015   CLINICAL DATA:  Chest pain  EXAM: MYOCARDIAL IMAGING WITH SPECT (REST AND PHARMACOLOGIC-STRESS)  GATED LEFT VENTRICULAR WALL MOTION STUDY  LEFT VENTRICULAR EJECTION FRACTION  TECHNIQUE: Standard myocardial SPECT imaging was performed after resting intravenous injection of 10 mCi Tc-7m sestamibi. Subsequently, intravenous infusion of Lexiscan was performed under the supervision of the Cardiology staff. At peak effect of the drug, 30 mCi Tc-27m sestamibi was injected intravenously and standard myocardial SPECT imaging was performed. Quantitative  gated imaging was also performed to evaluate left ventricular wall motion, and estimate left ventricular ejection fraction.  COMPARISON:  None.  FINDINGS: Perfusion: No decreased activity in the left ventricle on stress imaging to suggest reversible ischemia or infarction. Small to medium sized, mild severity, fixed defect in the inferior wall of the left ventricle.  Wall Motion: Hypokinesis of the inferolateral wall. No left ventricular dilation.  Left Ventricular Ejection Fraction: 19 %  End diastolic volume [id number] ml  End systolic volume [id number] ml  IMPRESSION: 1. No reversible ischemia  or infarction. Fixed defect along the inferior wall of the left ventricle, as described above, suggesting prior myocardial infarction.  2. Hypokinesis of the inferolateral wall of the left ventricle.  3. Left ventricular ejection fraction 19%  4. High-risk stress test findings*.  *2012 Appropriate Use Criteria for Coronary Revascularization Focused Update: J Am Coll Cardiol. N6492421. http://content.[email or web address]?articleid=1201161   Electronically Signed   By: Julian Hy M.D.   On: 08/14/2015 12:38   EKG tracing reviewed. LVH with strain, nsr  Scheduled Meds: . aspirin EC  325 mg Oral Daily  . calcium acetate  2,001 mg Oral TID WC  . carvedilol  6.25 mg Oral BID WC  . cloNIDine  0.1 mg Oral BID  . clopidogrel  75 mg Oral Daily  . doxercalciferol  2 mcg Intravenous Q M,W,F-HD  . feeding supplement (NEPRO CARB STEADY)  237 mL Oral BID BM  . [START ON 08/17/2015] ferric gluconate (FERRLECIT/NULECIT) IV  62.5 mg Intravenous Q Fri-HD  . finasteride  5 mg Oral Daily  . gabapentin  100 mg Oral TID  . heparin subcutaneous  5,000 Units Subcutaneous 3 times per day  . hydrALAZINE  50 mg Oral TID  . multivitamin  1 tablet Oral QHS  . nicotine  7 mg Transdermal Daily  . NIFEdipine  90 mg Oral Daily  . omega-3 acid ethyl esters  1 g Oral BID  . pantoprazole  40 mg Oral Daily  . pravastatin  40 mg Oral QPM  . sodium chloride  3 mL Intravenous Q12H  . tamsulosin  0.4 mg Oral BID  . terazosin  2 mg Oral QHS   Continuous Infusions:    Time spent: 15 minutes  [facility] [facility] [email or web address], password [facility] 08/15/2015, 4:45 PM  LOS: 0 days

## 2015-08-15 NOTE — Progress Notes (Signed)
*  PRELIMINARY RESULTS* Echocardiogram 2D Echocardiogram has been performed.  Leavy Cella 08/15/2015, 2:47 PM

## 2015-08-15 NOTE — Progress Notes (Signed)
Patient Name: Trevor [name] Date of Encounter: 08/15/2015  Principal Problem:  Chest pain Active Problems:  Tobacco use disorder  History of stroke  Chronic combined systolic and diastolic CHF (congestive heart failure)  Anemia  ESRD on hemodialysis  Protein-calorie malnutrition, severe  CAD (coronary artery disease)  Malignant hypertension   Primary Cardiologist: Dr Stanford Breed  Patient Profile: 69 yo male w/ hx mod but non-obs dz by cath w/ FFR 07/2014, ESRD on HD, CVA, remote cocaine use, admitted 09/11 w/ atypical chest pain and BP 216/2013. Trop peak 0.49 but felt demand ischemia, Myoview showed EF 19% and fixed defect; without inducible ischemia. Last echo 5/16 with EF 50%. Pending echo.   SUBJECTIVE  Seen in HD. Feeling well. No chest pain, sob or palpitations.   CURRENT MEDS . aspirin EC  325 mg Oral Daily  . calcium acetate  2,001 mg Oral TID WC  . carvedilol  6.25 mg Oral BID WC  . cloNIDine  0.1 mg Oral BID  . clopidogrel  75 mg Oral Daily  . doxercalciferol  2 mcg Intravenous Q M,W,F-HD  . feeding supplement (NEPRO CARB STEADY)  237 mL Oral BID BM  . [START ON 08/17/2015] ferric gluconate (FERRLECIT/NULECIT) IV  62.5 mg Intravenous Q Fri-HD  . finasteride  5 mg Oral Daily  . gabapentin  100 mg Oral TID  . hydrALAZINE  50 mg Oral TID  . multivitamin  1 tablet Oral QHS  . nicotine  7 mg Transdermal Daily  . NIFEdipine  90 mg Oral Daily  . omega-3 acid ethyl esters  1 g Oral BID  . pantoprazole  40 mg Oral Daily  . pravastatin  40 mg Oral QPM  . sodium chloride  3 mL Intravenous Q12H  . tamsulosin  0.4 mg Oral BID  . terazosin  2 mg Oral QHS    OBJECTIVE  Filed Vitals:   08/15/15 0930 08/15/15 1000 08/15/15 1100 08/15/15 1120  BP: 114/53 114/50 118/54 123/59  Pulse: 65 65 62 63  Temp:    98 F (36.7 C)  TempSrc:    Oral  Resp: 13 12 12 20   Height:      Weight:    136 lb 7.4 oz (61.9 kg)  SpO2:    99%    Intake/Output Summary (Last 24  hours) at 08/15/15 1140 Last data filed at 08/15/15 1120  Gross per 24 hour  Intake      0 ml  Output   1000 ml  Net  -1000 ml   Filed Weights   08/15/15 0456 08/15/15 0718 08/15/15 1120  Weight: 140 lb 4.8 oz (63.64 kg) 138 lb 10.7 oz (62.9 kg) 136 lb 7.4 oz (61.9 kg)    PHYSICAL EXAM  General: Pleasant, NAD. Neuro: Alert and oriented X 3. Moves all extremities spontaneously. Psych: Normal affect. HEENT:  Normal  Neck: Supple without bruits or JVD. Lungs:  Resp regular and unlabored. Diminished breath sound bibasilar.  Heart: RRR no s3, s4, or murmurs. Abdomen: Soft, non-tender, non-distended, BS + x 4.  Extremities: No clubbing, cyanosis or edema. DP/PT/Radials 2+ and equal bilaterally.  Accessory Clinical Findings  CBC  Recent Labs  08/13/15 0413 08/14/15 1800  WBC 4.1 3.5*  HGB 8.6* 9.3*  HCT 24.9* 27.2*  MCV 87.1 87.7  PLT 130* Q000111Q*   Basic Metabolic Panel  Recent Labs  08/13/15 0413 08/14/15 1800  NA 138 135  K 3.9 4.4  CL 102 102  CO2 26 24  GLUCOSE  96 130*  BUN 33* 21*  CREATININE 8.71* 5.61*  CALCIUM 8.8* 8.6*  PHOS  --  3.2   Liver Function Tests  Recent Labs  08/14/15 1800  ALBUMIN 2.9*   No results for input(s): LIPASE, AMYLASE in the last 72 hours. Cardiac Enzymes  Recent Labs  08/12/15 2235 08/13/15 0413 08/13/15 0948  TROPONINI 0.27* 0.49* 0.47*    TELE  SR  Radiology/Studies  Dg Chest 2 View  08/12/2015   CLINICAL DATA:  Chest pain. LEFT-sided chest pain with nausea and shortness of breath for 3 hours.  EXAM: CHEST  2 VIEW  COMPARISON:  Multiple priors dating back to 2013.  FINDINGS: The cardiopericardial silhouette is enlarged. Tortuous thoracic aorta with arch atherosclerosis. No airspace disease or pleural effusion. Prominent nipple shadow projects over the RIGHT lung base.  IMPRESSION: Cardiomegaly without failure.   Electronically Signed   By: Dereck Ligas M.D.   On: 08/12/2015 18:39   Nm Myocar Multi W/spect  W/wall Motion / Ef  08/14/2015   CLINICAL DATA:  Chest pain  EXAM: MYOCARDIAL IMAGING WITH SPECT (REST AND PHARMACOLOGIC-STRESS)  GATED LEFT VENTRICULAR WALL MOTION STUDY  LEFT VENTRICULAR EJECTION FRACTION  TECHNIQUE: Standard myocardial SPECT imaging was performed after resting intravenous injection of 10 mCi Tc-65m sestamibi. Subsequently, intravenous infusion of Lexiscan was performed under the supervision of the Cardiology staff. At peak effect of the drug, 30 mCi Tc-51m sestamibi was injected intravenously and standard myocardial SPECT imaging was performed. Quantitative gated imaging was also performed to evaluate left ventricular wall motion, and estimate left ventricular ejection fraction.  COMPARISON:  None.  FINDINGS: Perfusion: No decreased activity in the left ventricle on stress imaging to suggest reversible ischemia or infarction. Small to medium sized, mild severity, fixed defect in the inferior wall of the left ventricle.  Wall Motion: Hypokinesis of the inferolateral wall. No left ventricular dilation.  Left Ventricular Ejection Fraction: 19 %  End diastolic volume [id number] ml  End systolic volume [id number] ml  IMPRESSION: 1. No reversible ischemia or infarction. Fixed defect along the inferior wall of the left ventricle, as described above, suggesting prior myocardial infarction.  2. Hypokinesis of the inferolateral wall of the left ventricle.  3. Left ventricular ejection fraction 19%  4. High-risk stress test findings*.  *2012 Appropriate Use Criteria for Coronary Revascularization Focused Update: J Am Coll Cardiol. N6492421. http://content.[email or web address].aspx?articleid=1201161   Electronically Signed   By: Julian Hy M.D.   On: 08/14/2015 12:38   Dg Chest Portable 1 View  07/30/2015   CLINICAL DATA:  Intermittent dyspnea and fatigue. Missed dialysis on Saturday.  EXAM: PORTABLE CHEST - 1 VIEW  COMPARISON:  04/03/2015  FINDINGS: There is mild central vascular prominence. No  alveolar opacities. No large effusions. Mediastinal and hilar contours appear unremarkable and unchanged.  IMPRESSION: Mild central vascular congestion without alveolar edema.   Electronically Signed   By: Andreas Newport M.D.   On: 07/30/2015 06:16    ASSESSMENT AND PLAN  1. Chest pain - Trop trend 0.27->0.49->0.47, felt demand ischemia 2nd uncontrolled HTN - Myoview showed no reversible ischemia or infarction. Fixed defect along the inferior wall of the left ventricle, suggesting prior myocardial infarction.  Hypokinesis of the inferolateral wall of the left ventricle.  Left ventricular ejection fraction 19%. High-risk stress test findings. - Echo 04/05/15 showed LV EF of 50%, grade 1 DD, moderate LVH. Pending repeat echo. If EF is low, needs cath. MD to advice - Continue ASA, Coreg, plavix, statin,   2.  CAD - Cath 07/2014 showed 50-70% left circumflex with no significant obstruction by FFR, 60% diagonal - Cath done 8/7//15 revealed moderate CFX disease with an area of 60% stenosis - As above  3. Tobacco use disorder: advice smoking cessation.   4. Chronic combined systolic and diastolic CHF (congestive heart failure): seen in HD today  5.  Malignant hypertension - improved.   Signed, Zuriah Bordas PA-C

## 2015-08-15 NOTE — Clinical Social Work Note (Signed)
Clinical Social Work Assessment  Patient Details  Name: Trevor [name] MRN: [id number] Date of Birth: 23-Oct-1946  Date of referral:  08/15/15               Reason for consult:  Facility Placement                Permission sought to share information with:  Chartered certified accountant granted to share information::  Yes, Verbal Permission Granted  Name::        Agency::  [place]  Relationship::     Contact Information:     Housing/Transportation Living arrangements for the past 2 months:  Single Family Home Source of Information:  Patient, Other (Comment Required) (Sibling) Patient Interpreter Needed:  None Criminal Activity/Legal Involvement Pertinent to Current Situation/Hospitalization:  No - Comment as needed Significant Relationships:  Adult Children, Other Family Members Lives with:  Self Do you feel safe going back to the place where you live?  Yes Need for family participation in patient care:  Yes (Comment) (Pt requests family involvement for better understanding)  Care giving concerns:  Pt with [name] recommendation but currently Observation status   Social Worker assessment / plan:  CSW discussed pt case with nurse and RNCM prior to speaking with pt. Pt may potentially be changed to inpatient status and could potentially have need for a 3 night stay. CSW visited pt room and spoke with pt and pt sister at bedside. CSW briefly explained Observation vs Inpatient and how this can affect pt with Medicare planning to dc to [name]. CSW spoke more specifically about pt case and potential change to inpatient and need for 3 night stay. Pt able to understand broadly but pt sister with better understanding of details. Pt informed CSW if he does qualify he is agreeable to [name]. He has been to [name] care in the past and would want to return if possible. If pt does not qualify, he cannot pay privately and plans to return home. Pt lives home along and reports he  would have limited assistance as his family is busy with work or their families. CSW will discuss further with medical team if pt will not qualify for Medicare coverage.   Employment status:    Insurance information:  Medicare PT Recommendations:  [place] / Referral to community resources:  [place]  Patient/Family's Response to care:  Pt and pt sister awaiting confirmation on pt hospital status prior to committing to [name] plan.  Patient/Family's Understanding of and Emotional Response to Diagnosis, Current Treatment, and Prognosis:  Pt and pt family have a good understanding of pt current treatment and diagnosis. Pt with appropriate emotional response and in good spirits.   Emotional Assessment Appearance:  Appears stated age, Well-Groomed Attitude/Demeanor/Rapport:  Other (Cooperative) Affect (typically observed):  Calm, Pleasant Orientation:  Oriented to Self, Oriented to Place, Oriented to  Time, Oriented to Situation Alcohol / Substance use:  Not Applicable Psych involvement (Current and /or in the community):  No (Comment)  Discharge Needs  Concerns to be addressed:  Discharge Planning Concerns Readmission within the last 30 days:  No Current discharge risk:  Dependent with Mobility, Chronically ill Barriers to Discharge:  Continued Medical Work up   [employer], [place]

## 2015-08-16 DIAGNOSIS — R072 Precordial pain: Secondary | ICD-10-CM

## 2015-08-16 MED ORDER — ASPIRIN 81 MG PO CHEW
81.0000 mg | CHEWABLE_TABLET | Freq: Every day | ORAL | Status: DC
  Administered 2015-08-17 – 2015-08-18 (×2): 81 mg via ORAL
  Filled 2015-08-16 (×2): qty 1

## 2015-08-16 NOTE — Progress Notes (Signed)
[facility] PROGRESS NOTE  Trevor [name] [id number] DOB: [birth year] DOA: 08/12/2015 PCP: [name], MD  Assessment/Plan:  Principal Problem:   Chest pain with minimally elevated trop. MV without inducible ischemia, but EF 19% and fixed defect. Last echo 5/16 with EF 50%. Echo shows NORMAL EF.  plan is for pt to go to [name].     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    Anemia   ESRD on hemodialysis: nephrology consulted   Protein-calorie malnutrition, severe   CAD (coronary artery disease)   Malignant hypertension: Improved.  HPI/Subjective: No complaints  Objective: Filed Vitals:   08/16/15 0438  BP: 110/50  Pulse:   Temp: 98.7 F (37.1 C)  Resp: 18    Intake/Output Summary (Last 24 hours) at 08/16/15 1034 Last data filed at 08/16/15 0100  Gross per 24 hour  Intake    120 ml  Output   1000 ml  Net   -880 ml   Filed Weights   08/15/15 0718 08/15/15 1120 08/16/15 0438  Weight: 62.9 kg (138 lb 10.7 oz) 61.9 kg (136 lb 7.4 oz) 61.462 kg (135 lb 8 oz)    Exam:   General:  Alert, oriented. Comfortable.  Cardiovascular: Regular rate rhythm without murmurs gallops rubs  Respiratory: Clear to auscultation bilaterally without wheezes rhonchi or rales  Abdomen: Soft nontender nondistended  Ext: AV fistula on left upper extremity with thrill. No peripheral edema.  Basic Metabolic Panel:  Recent Labs Lab 08/12/15 1819 08/13/15 0413 08/14/15 1800  NA 140 138 135  K 3.9 3.9 4.4  CL 103 102 102  CO2 26 26 24   GLUCOSE 84 96 130*  BUN 26* 33* 21*  CREATININE 7.90* 8.71* 5.61*  CALCIUM 9.6 8.8* 8.6*  PHOS  --   --  3.2   Liver Function Tests:  Recent Labs Lab 08/14/15 1800  ALBUMIN 2.9*   No results for input(s): LIPASE, AMYLASE in the last 168 hours. No results for input(s): AMMONIA in the last 168 hours. CBC:  Recent  Labs Lab 08/12/15 1819 08/13/15 0413 08/14/15 1800  WBC 4.5 4.1 3.5*  HGB 10.3* 8.6* 9.3*  HCT 29.7* 24.9* 27.2*  MCV 88.9 87.1 87.7  PLT 141* 130* 141*   Cardiac Enzymes:  Recent Labs Lab 08/12/15 2235 08/13/15 0413 08/13/15 0948  TROPONINI 0.27* 0.49* 0.47*   BNP (last 3 results)  Recent Labs  01/08/15 1929  BNP >4500.0*    ProBNP (last 3 results) No results for input(s): PROBNP in the last 8760 hours.  CBG:  Recent Labs Lab 08/14/15 2035 08/15/15 1205 08/15/15 1616  GLUCAP 81 136* 132*    No results found for this or any previous visit (from the past 240 hour(s)).   Studies: Nm Myocar Multi W/spect W/wall Motion / Ef  08/14/2015   CLINICAL DATA:  Chest pain  EXAM: MYOCARDIAL IMAGING WITH SPECT (REST AND PHARMACOLOGIC-STRESS)  GATED LEFT VENTRICULAR WALL MOTION STUDY  LEFT VENTRICULAR EJECTION FRACTION  TECHNIQUE: Standard myocardial SPECT imaging was performed after resting intravenous injection of 10 mCi Tc-7m sestamibi. Subsequently, intravenous infusion of Lexiscan was performed under the supervision of the Cardiology staff. At peak effect of the drug, 30 mCi Tc-71m sestamibi was injected intravenously and standard myocardial SPECT imaging was performed. Quantitative gated imaging was also performed to evaluate left ventricular wall motion, and estimate left ventricular ejection fraction.  COMPARISON:  None.  FINDINGS: Perfusion: No decreased activity in the left ventricle on stress imaging to suggest reversible ischemia or infarction. Small to medium sized, mild severity, fixed defect in the inferior wall of the left ventricle.  Wall Motion: Hypokinesis of the inferolateral wall. No left ventricular dilation.  Left Ventricular Ejection Fraction: 19 %  End diastolic volume [id number] ml  End systolic volume [id number] ml  IMPRESSION: 1. No reversible ischemia or infarction. Fixed defect along the inferior wall of the left ventricle, as described above, suggesting prior  myocardial infarction.  2. Hypokinesis of the inferolateral wall of the left ventricle.  3. Left ventricular ejection fraction 19%  4. High-risk stress test findings*.  *2012 Appropriate Use Criteria for Coronary Revascularization Focused Update: J Am Coll Cardiol. N6492421. http://content.[email or web address].aspx?articleid=1201161   Electronically Signed   By: Julian Hy M.D.   On: 08/14/2015 12:38   EKG tracing reviewed. LVH with strain, nsr  Scheduled Meds: . aspirin EC  325 mg Oral Daily  . calcium acetate  2,001 mg Oral TID WC  . carvedilol  6.25 mg Oral BID WC  . cloNIDine  0.1 mg Oral BID  . clopidogrel  75 mg Oral Daily  . doxercalciferol  2 mcg Intravenous Q M,W,F-HD  . feeding supplement (NEPRO CARB STEADY)  237 mL Oral BID BM  . [START ON 08/17/2015] ferric gluconate (FERRLECIT/NULECIT) IV  62.5 mg Intravenous Q Fri-HD  . finasteride  5 mg Oral Daily  . gabapentin  100 mg Oral TID  . heparin subcutaneous  5,000 Units Subcutaneous 3 times per day  . hydrALAZINE  50 mg Oral TID  . multivitamin  1 tablet Oral QHS  . nicotine  7 mg Transdermal Daily  . NIFEdipine  90 mg Oral Daily  . omega-3 acid ethyl esters  1 g Oral BID  . pantoprazole  40 mg Oral Daily  . pravastatin  40 mg Oral QPM  . sodium chloride  3 mL Intravenous Q12H  . tamsulosin  0.4 mg Oral BID  . terazosin  2 mg Oral QHS   Continuous Infusions:    Time spent: 15 minutes  VANN, JESSICA  [facility] [email or web address], password [facility] 08/16/2015, 10:34 AM  LOS: 1 day

## 2015-08-16 NOTE — Progress Notes (Signed)
Assessment/Plan: 1. Chest pain - w/ demand ischemia/ EF 55-60% 2. ESRD - MWF - HD Fri per routine 3. Hypertension/volume - OK, will stop terazocin 4. Anemia - Hgb stable   Subjective: Interval History: Stable awaiting [name].  Objective: Vital signs in last 24 hours: Temp:  [98 F (36.7 [name])-98.7 F (37.1 [name])] 98.7 F (37.1 [name]) (09/15 0438) Pulse Rate:  [60-73] 73 (09/14 2010) Resp:  [18-20] 18 (09/15 0438) BP: (110-159)/(50-62) 110/50 mmHg (09/15 0438) SpO2:  [98 %-100 %] 98 % (09/15 0438) Weight:  [61.462 kg (135 lb 8 oz)-61.9 kg (136 lb 7.4 oz)] 61.462 kg (135 lb 8 oz) (09/15 0438) Weight change: -0.74 kg (-1 lb 10.1 oz)  Intake/Output from previous day: 09/14 0701 - 09/15 0700 In: 120 [P.O.:120] Out: 1000  Intake/Output this shift:    General appearance: alert and cooperative Resp: clear to auscultation bilaterally Cardio: regular rate and rhythm, S1, S2 normal, no murmur, click, rub or gallop Extremities: extremities normal, atraumatic, no cyanosis or edema  Lab Results:  Recent Labs  08/14/15 1800  WBC 3.5*  HGB 9.3*  HCT 27.2*  PLT 141*   BMET:  Recent Labs  08/14/15 1800  NA 135  K 4.4  CL 102  CO2 24  GLUCOSE 130*  BUN 21*  CREATININE 5.61*  CALCIUM 8.6*   No results for input(s): PTH in the last 72 hours. Iron Studies: No results for input(s): IRON, TIBC, TRANSFERRIN, FERRITIN in the last 72 hours. Studies/Results: Nm Myocar Multi W/spect W/wall Motion / Ef  08/14/2015   CLINICAL DATA:  Chest pain  EXAM: MYOCARDIAL IMAGING WITH SPECT (REST AND PHARMACOLOGIC-STRESS)  GATED LEFT VENTRICULAR WALL MOTION STUDY  LEFT VENTRICULAR EJECTION FRACTION  TECHNIQUE: Standard myocardial SPECT imaging was performed after resting intravenous injection of 10 mCi Tc-21m sestamibi. Subsequently, intravenous infusion of Lexiscan was performed under the supervision of the Cardiology staff. At peak effect of the drug, 30 mCi Tc-20m sestamibi was injected intravenously  and standard myocardial SPECT imaging was performed. Quantitative gated imaging was also performed to evaluate left ventricular wall motion, and estimate left ventricular ejection fraction.  COMPARISON:  None.  FINDINGS: Perfusion: No decreased activity in the left ventricle on stress imaging to suggest reversible ischemia or infarction. Small to medium sized, mild severity, fixed defect in the inferior wall of the left ventricle.  Wall Motion: Hypokinesis of the inferolateral wall. No left ventricular dilation.  Left Ventricular Ejection Fraction: 19 %  End diastolic volume [id number] ml  End systolic volume [id number] ml  IMPRESSION: 1. No reversible ischemia or infarction. Fixed defect along the inferior wall of the left ventricle, as described above, suggesting prior myocardial infarction.  2. Hypokinesis of the inferolateral wall of the left ventricle.  3. Left ventricular ejection fraction 19%  4. High-risk stress test findings*.  *2012 Appropriate Use Criteria for Coronary Revascularization Focused Update: J Am Coll Cardiol. N6492421. http://content.[email or web address].aspx?articleid=1201161   Electronically Signed   By: Julian Hy M.D.   On: 08/14/2015 12:38    Scheduled: . aspirin EC  325 mg Oral Daily  . calcium acetate  2,001 mg Oral TID WC  . carvedilol  6.25 mg Oral BID WC  . cloNIDine  0.1 mg Oral BID  . clopidogrel  75 mg Oral Daily  . doxercalciferol  2 mcg Intravenous Q M,W,F-HD  . feeding supplement (NEPRO CARB STEADY)  237 mL Oral BID BM  . [START ON 08/17/2015] ferric gluconate (FERRLECIT/NULECIT) IV  62.5 mg Intravenous Q Fri-HD  .  finasteride  5 mg Oral Daily  . gabapentin  100 mg Oral TID  . heparin subcutaneous  5,000 Units Subcutaneous 3 times per day  . hydrALAZINE  50 mg Oral TID  . multivitamin  1 tablet Oral QHS  . nicotine  7 mg Transdermal Daily  . NIFEdipine  90 mg Oral Daily  . omega-3 acid ethyl esters  1 g Oral BID  . pantoprazole  40 mg Oral Daily  .  pravastatin  40 mg Oral QPM  . sodium chloride  3 mL Intravenous Q12H  . tamsulosin  0.4 mg Oral BID  . terazosin  2 mg Oral QHS     LOS: 1 day   Trevor [name] 08/16/2015,11:06 AM

## 2015-08-16 NOTE — Progress Notes (Signed)
Physical Therapy Treatment Patient Details Name: Trevor [name] MRN: [id number] DOB: [birth year] Today's Date: 08/16/2015   History of Present Illness 69 y.o. male admitted to [facility] on 08/12/15 for chest pain.  Pt's troponin was mildly elevated (thought to be demand ischemia), and BP was very elevated 200s/100s. cardiology workup in progress.  Pt with significant PMHx of HTN, MI, CVA, SOB, blindness, and CKD (on HD).     PT Comments    Pt was able to progress gait further into the hallway today, however, he continues to be limited by lightheadedness/weakness and decreased balance during gait.  He is appropriate for [name] placement at discharge.   Follow Up Recommendations  [name]     Equipment Recommendations  None recommended by PT    Recommendations for Other Services   NA     Precautions / Restrictions Precautions Precautions: Fall Precaution Comments: unsteady on his feet with right leg weaker than left leg.    Mobility  Bed Mobility Overal bed mobility: Modified Independent             General bed mobility comments: extra time needed to complete task and heavy reliance on the railing for leverage  Transfers Overall transfer level: Needs assistance Equipment used: Rolling walker (2 wheeled) Transfers: Sit to/from Stand Sit to Stand: Min assist         General transfer comment: Min assist to prevent posterior LOB and support trunk to power up from both low chair and bed.  Pt needs verbal cues to stand for a few seconds before starting to walk as a precaution due to his lightheadedness.   Ambulation/Gait Ambulation/Gait assistance: Min assist Ambulation Distance (Feet): 80 Feet Assistive device: Rolling walker (2 wheeled) Gait Pattern/deviations: Step-through pattern;Shuffle;Decreased dorsiflexion - right;Decreased stance time - right;Decreased step length - right Gait velocity: decreased Gait velocity interpretation: Below normal speed for age/gender General Gait  Details: Pt with weak right leg pt "thinks" from PMHx of stroke.  Pt is able to compensate well, but weakness is apparent, especially towards the end of the walk due to fatigue.  One seated rest break.            Balance Overall balance assessment: Needs assistance Sitting-balance support: Feet supported;No upper extremity supported Sitting balance-Leahy Scale: Good     Standing balance support: Bilateral upper extremity supported Standing balance-Leahy Scale: Poor                      Cognition Arousal/Alertness: Awake/alert Behavior During Therapy: WFL for tasks assessed/performed Overall Cognitive Status: No family/caregiver present to determine baseline cognitive functioning                      Exercises General Exercises - Lower Extremity Long Arc Quad: AROM;Both;10 reps;Seated Hip ABduction/ADduction: AROM;Both;10 reps;Seated Hip Flexion/Marching: AROM;Both;10 reps;Seated Toe Raises: AROM;Both;10 reps;Seated Heel Raises: AROM;Both;10 reps;Seated        Pertinent Vitals/Pain Pain Assessment: No/denies pain           PT Goals (current goals can now be found in the care plan section) Acute Rehab PT Goals Patient Stated Goal: to get stronger before he goes home Progress towards PT goals: Progressing toward goals    Frequency  Min 3X/week    PT Plan Current plan remains appropriate       End of Session Equipment Utilized During Treatment: Gait belt Activity Tolerance: Patient limited by fatigue Patient left: in chair;with call bell/phone within reach;with chair alarm set  Time: [id number] PT Time Calculation (min) (ACUTE ONLY): 17 min  Charges:  $Gait Training: 8-22 mins                      Caria Transue B. [place], [place], DPT [phone]   08/16/2015, 7:01 PM

## 2015-08-16 NOTE — Progress Notes (Signed)
Patient Name: Trevor [name] Date of Encounter: 08/16/2015  Principal Problem:  Chest pain Active Problems:  Tobacco use disorder  History of stroke  Chronic combined systolic and diastolic CHF (congestive heart failure)  Anemia  ESRD on hemodialysis  Protein-calorie malnutrition, severe  CAD (coronary artery disease)  Malignant hypertension   Primary Cardiologist: Dr Stanford Breed  Patient Profile: 69 yo male w/ hx mod but non-obs dz by cath w/ FFR 07/2014, ESRD on HD, CVA, remote cocaine use, admitted 09/11 w/ atypical chest pain and BP 216/2013. Trop peak 0.49 but felt demand ischemia, Myoview showed EF 19% and fixed defect; without inducible ischemia. Last echo 5/16 with EF 50%. Repeat echo 08/15/15 with LV Ef F 55-60%.   SUBJECTIVE  Sleepy this morning. Did not slept well overnight. No chest pain, sob or palpitations.   CURRENT MEDS . aspirin EC  325 mg Oral Daily  . calcium acetate  2,001 mg Oral TID WC  . carvedilol  6.25 mg Oral BID WC  . cloNIDine  0.1 mg Oral BID  . clopidogrel  75 mg Oral Daily  . doxercalciferol  2 mcg Intravenous Q M,W,F-HD  . feeding supplement (NEPRO CARB STEADY)  237 mL Oral BID BM  . [START ON 08/17/2015] ferric gluconate (FERRLECIT/NULECIT) IV  62.5 mg Intravenous Q Fri-HD  . finasteride  5 mg Oral Daily  . gabapentin  100 mg Oral TID  . heparin subcutaneous  5,000 Units Subcutaneous 3 times per day  . hydrALAZINE  50 mg Oral TID  . multivitamin  1 tablet Oral QHS  . nicotine  7 mg Transdermal Daily  . NIFEdipine  90 mg Oral Daily  . omega-3 acid ethyl esters  1 g Oral BID  . pantoprazole  40 mg Oral Daily  . pravastatin  40 mg Oral QPM  . sodium chloride  3 mL Intravenous Q12H  . tamsulosin  0.4 mg Oral BID  . terazosin  2 mg Oral QHS    OBJECTIVE  Filed Vitals:   08/15/15 1604 08/15/15 1729 08/15/15 2010 08/16/15 0438  BP: 158/60 154/60 159/62 110/50  Pulse:  60 73   Temp:   98.4 F (36.9 C) 98.7 F (37.1 C)  TempSrc:    Oral Oral  Resp:    18  Height:      Weight:    135 lb 8 oz (61.462 kg)  SpO2:   99% 98%    Intake/Output Summary (Last 24 hours) at 08/16/15 0918 Last data filed at 08/16/15 0100  Gross per 24 hour  Intake    120 ml  Output   1000 ml  Net   -880 ml   Filed Weights   08/15/15 0718 08/15/15 1120 08/16/15 0438  Weight: 138 lb 10.7 oz (62.9 kg) 136 lb 7.4 oz (61.9 kg) 135 lb 8 oz (61.462 kg)    PHYSICAL EXAM  General: Pleasant, NAD. Neuro: Alert and oriented X 3. Moves all extremities spontaneously. Psych: Normal affect. HEENT:  Normal  Neck: Supple without bruits or JVD. Lungs:  Resp regular and unlabored. Diminished breath sound bibasilar.  Heart: RRR no s3, s4. SEM murmurs. Abdomen: Soft, non-tender, non-distended, BS + x 4.  Extremities: No clubbing, cyanosis or edema. DP/PT/Radials 2+ and equal bilaterally.  Accessory Clinical Findings  CBC  Recent Labs  08/14/15 1800  WBC 3.5*  HGB 9.3*  HCT 27.2*  MCV 87.7  PLT [id number]*   Basic Metabolic Panel  Recent Labs  08/14/15 1800  NA 135  K 4.4  CL 102  CO2 24  GLUCOSE 130*  BUN 21*  CREATININE 5.61*  CALCIUM 8.6*  PHOS 3.2   Liver Function Tests  Recent Labs  08/14/15 1800  ALBUMIN 2.9*   No results for input(s): LIPASE, AMYLASE in the last 72 hours. Cardiac Enzymes  Recent Labs  08/13/15 0948  TROPONINI 0.47*    TELE  SR at rate of 60s  Radiology/Studies  Dg Chest 2 View  08/12/2015   CLINICAL DATA:  Chest pain. LEFT-sided chest pain with nausea and shortness of breath for 3 hours.  EXAM: CHEST  2 VIEW  COMPARISON:  Multiple priors dating back to 2013.  FINDINGS: The cardiopericardial silhouette is enlarged. Tortuous thoracic aorta with arch atherosclerosis. No airspace disease or pleural effusion. Prominent nipple shadow projects over the RIGHT lung base.  IMPRESSION: Cardiomegaly without failure.   Electronically Signed   By: Dereck Ligas M.D.   On: 08/12/2015 18:39   Nm Myocar Multi  W/spect W/wall Motion / Ef  08/14/2015   CLINICAL DATA:  Chest pain  EXAM: MYOCARDIAL IMAGING WITH SPECT (REST AND PHARMACOLOGIC-STRESS)  GATED LEFT VENTRICULAR WALL MOTION STUDY  LEFT VENTRICULAR EJECTION FRACTION  TECHNIQUE: Standard myocardial SPECT imaging was performed after resting intravenous injection of 10 mCi Tc-74m sestamibi. Subsequently, intravenous infusion of Lexiscan was performed under the supervision of the Cardiology staff. At peak effect of the drug, 30 mCi Tc-96m sestamibi was injected intravenously and standard myocardial SPECT imaging was performed. Quantitative gated imaging was also performed to evaluate left ventricular wall motion, and estimate left ventricular ejection fraction.  COMPARISON:  None.  FINDINGS: Perfusion: No decreased activity in the left ventricle on stress imaging to suggest reversible ischemia or infarction. Small to medium sized, mild severity, fixed defect in the inferior wall of the left ventricle.  Wall Motion: Hypokinesis of the inferolateral wall. No left ventricular dilation.  Left Ventricular Ejection Fraction: 19 %  End diastolic volume [id number] ml  End systolic volume [id number] ml  IMPRESSION: 1. No reversible ischemia or infarction. Fixed defect along the inferior wall of the left ventricle, as described above, suggesting prior myocardial infarction.  2. Hypokinesis of the inferolateral wall of the left ventricle.  3. Left ventricular ejection fraction 19%  4. High-risk stress test findings*.  *2012 Appropriate Use Criteria for Coronary Revascularization Focused Update: J Am Coll Cardiol. N6492421. http://content.[email or web address].aspx?articleid=1201161   Electronically Signed   By: Julian Hy M.D.   On: 08/14/2015 12:38   Dg Chest Portable 1 View  07/30/2015   CLINICAL DATA:  Intermittent dyspnea and fatigue. Missed dialysis on Saturday.  EXAM: PORTABLE CHEST - 1 VIEW  COMPARISON:  04/03/2015  FINDINGS: There is mild central vascular  prominence. No alveolar opacities. No large effusions. Mediastinal and hilar contours appear unremarkable and unchanged.  IMPRESSION: Mild central vascular congestion without alveolar edema.   Electronically Signed   By: Andreas Newport M.D.   On: 07/30/2015 06:16   Echo 08/15/15 LV EF: 55% -  60%  ------------------------------------------------------------------- Indications:   Chest pain 786.51.  ------------------------------------------------------------------- History:  PMH: NICM. Cocaine Abuse, Abdominal Aortic Aneurysm. Atrial fibrillation. Coronary artery disease. Congestive heart failure. Stroke. Transient ischemic attack. PMH:  Myocardial infarction. Risk factors: Current tobacco use.  ------------------------------------------------------------------- Study Conclusions  - Left ventricle: The cavity size was mildly dilated. Wall thickness was increased in a pattern of moderate LVH. Systolic function was normal. The estimated ejection fraction was in the range of 55% to 60%. Wall motion was normal; there  were no regional wall motion abnormalities. Doppler parameters are consistent with abnormal left ventricular relaxation (grade 1 diastolic dysfunction). - Mitral valve: Calcified annulus. Mildly thickened leaflets . Valve area by pressure half-time: 1.9 cm^2. - Atrial septum: No defect or patent foramen ovale was identified.   ASSESSMENT AND PLAN  1. Chest pain - Trop trend 0.27->0.49->0.47, felt demand ischemia 2nd uncontrolled HTN - Myoview showed no reversible ischemia or infarction. Fixed defect along the inferior wall of the left ventricle, suggesting prior myocardial infarction.  Hypokinesis of the inferolateral wall of the left ventricle.  Left ventricular ejection fraction 19%. High-risk stress test findings. - Echo 04/05/15 showed LV EF of 50%, grade 1 DD, moderate LVH. Repeat echo 08/15/15 is reassuring with  LV EF of 55-60%, moderate  LVH, grade 1 DD. No need for further ischemic evaluation. - Continue ASA, Coreg, plavix, statin,   2. CAD - Cath 07/2014 showed 50-70% left circumflex with no significant obstruction by FFR, 60% diagonal - Cath done 8/7//15 revealed moderate CFX disease with an area of 60% stenosis - As above  3. Tobacco use disorder: advice smoking cessation.   4. Chronic combined systolic and diastolic CHF (congestive heart failure): managed with HD  5.  Malignant hypertension - improved.   Signed, Jenika Chiem PA-C

## 2015-08-16 NOTE — Progress Notes (Signed)
BSW intern spoke with Pt about placement to [facility] facility.  Pt lives alone. Pt daughter and son are working and going to school per Pt. so they are not able to provide assistance to patient. [facility] would be a better solution to fit his needs at this time. Pt states he has been a resident of [facility] about 5 months ago and he would like to return there. Bed offer in place. SW will monitor for stability.  [place] intern  [phone]

## 2015-08-17 DIAGNOSIS — D631 Anemia in chronic kidney disease: Secondary | ICD-10-CM

## 2015-08-17 DIAGNOSIS — N189 Chronic kidney disease, unspecified: Secondary | ICD-10-CM

## 2015-08-17 LAB — RENAL FUNCTION PANEL
Albumin: 2.6 g/dL — ABNORMAL LOW (ref 3.5–5.0)
Anion gap: 9 (ref 5–15)
BUN: 33 mg/dL — AB (ref 6–20)
CHLORIDE: 102 mmol/L (ref 101–111)
CO2: 26 mmol/L (ref 22–32)
CREATININE: 7.46 mg/dL — AB (ref 0.61–1.24)
Calcium: 9.8 mg/dL (ref 8.9–10.3)
GFR calc Af Amer: 8 mL/min — ABNORMAL LOW (ref >60)
GFR, EST NON AFRICAN AMERICAN: 7 mL/min — AB (ref >60)
Glucose, Bld: 116 mg/dL — ABNORMAL HIGH (ref 65–99)
POTASSIUM: 4.2 mmol/L (ref 3.5–5.1)
Phosphorus: 3 mg/dL (ref 2.5–4.6)
Sodium: 137 mmol/L (ref 135–145)

## 2015-08-17 LAB — LIPID PANEL
CHOLESTEROL: 77 mg/dL (ref 0–200)
HDL: 27 mg/dL — AB (ref >40)
LDL Cholesterol: 35 mg/dL (ref 0–99)
TRIGLYCERIDES: 74 mg/dL (ref <150)
Total CHOL/HDL Ratio: 2.9 RATIO
VLDL: 15 mg/dL (ref 0–40)

## 2015-08-17 LAB — CBC
HEMATOCRIT: 25.1 % — AB (ref 39.0–52.0)
Hemoglobin: 8.6 g/dL — ABNORMAL LOW (ref 13.0–17.0)
MCH: 30.5 pg (ref 26.0–34.0)
MCHC: 34.3 g/dL (ref 30.0–36.0)
MCV: 89 fL (ref 78.0–100.0)
Platelets: 108 10*3/uL — ABNORMAL LOW (ref 150–400)
RBC: 2.82 MIL/uL — ABNORMAL LOW (ref 4.22–5.81)
RDW: 13.5 % (ref 11.5–15.5)
WBC: 4 10*3/uL (ref 4.0–10.5)

## 2015-08-17 MED ORDER — ATORVASTATIN CALCIUM 40 MG PO TABS
40.0000 mg | ORAL_TABLET | Freq: Every day | ORAL | Status: DC
  Administered 2015-08-17: 40 mg via ORAL
  Filled 2015-08-17: qty 1

## 2015-08-17 MED ORDER — CARVEDILOL 12.5 MG PO TABS
12.5000 mg | ORAL_TABLET | Freq: Two times a day (BID) | ORAL | Status: DC
  Filled 2015-08-17 (×2): qty 1

## 2015-08-17 MED ORDER — HEPARIN SODIUM (PORCINE) 1000 UNIT/ML DIALYSIS
20.0000 [IU]/kg | INTRAMUSCULAR | Status: DC | PRN
  Filled 2015-08-17: qty 2

## 2015-08-17 MED ORDER — LIDOCAINE HCL (PF) 1 % IJ SOLN: 5.0000 mL | INTRAMUSCULAR | Status: DC | PRN

## 2015-08-17 MED ORDER — SODIUM CHLORIDE 0.9 % IV SOLN: 100.0000 mL | INTRAVENOUS | Status: DC | PRN

## 2015-08-17 MED ORDER — ALTEPLASE 2 MG IJ SOLR
2.0000 mg | Freq: Once | INTRAMUSCULAR | Status: DC | PRN
  Filled 2015-08-17: qty 2

## 2015-08-17 MED ORDER — PENTAFLUOROPROP-TETRAFLUOROETH EX AERO
1.0000 | INHALATION_SPRAY | CUTANEOUS | Status: DC | PRN
Start: 2015-08-17 — End: 2015-08-17

## 2015-08-17 MED ORDER — HEPARIN SODIUM (PORCINE) 1000 UNIT/ML DIALYSIS
1000.0000 [IU] | INTRAMUSCULAR | Status: DC | PRN
  Filled 2015-08-17: qty 1

## 2015-08-17 MED ORDER — DOXERCALCIFEROL 4 MCG/2ML IV SOLN
INTRAVENOUS | Status: AC
  Filled 2015-08-17: qty 2

## 2015-08-17 MED ORDER — CARVEDILOL 6.25 MG PO TABS: 6.2500 mg | ORAL_TABLET | Freq: Two times a day (BID) | ORAL | Status: DC

## 2015-08-17 MED ORDER — LIDOCAINE-PRILOCAINE 2.5-2.5 % EX CREA
1.0000 "application " | TOPICAL_CREAM | CUTANEOUS | Status: DC | PRN
  Filled 2015-08-17: qty 5

## 2015-08-17 MED ORDER — CARVEDILOL 12.5 MG PO TABS
12.5000 mg | ORAL_TABLET | Freq: Two times a day (BID) | ORAL | Status: DC
  Administered 2015-08-17 – 2015-08-18 (×2): 12.5 mg via ORAL
  Filled 2015-08-17: qty 1

## 2015-08-17 NOTE — Progress Notes (Signed)
Patient Name: Trevor [name] Date of Encounter: 08/17/2015  Principal Problem:  Chest pain Active Problems:  Tobacco use disorder  History of stroke  Chronic combined systolic and diastolic CHF (congestive heart failure)  Anemia  ESRD on hemodialysis  Protein-calorie malnutrition, severe  CAD (coronary artery disease)  Malignant hypertension   Primary Cardiologist: Dr Stanford Breed  Patient Profile: 69 yo male w/ hx mod but non-obs dz by cath w/ FFR 07/2014, ESRD on HD, CVA, remote cocaine use, admitted 09/11 w/ atypical chest pain and BP 216/2013. Trop peak 0.49 but felt demand ischemia, Myoview showed EF 19% and fixed defect; without inducible ischemia. Last echo 5/16 with EF 50%. Repeat echo 08/15/15 with LV Ef F 55-60%.   SUBJECTIVE: Trevor [name] reports fatigue after returning from HD.  He also endorses mild chest discomfort.  CURRENT MEDS . aspirin  81 mg Oral Daily  . calcium acetate  2,001 mg Oral TID WC  . carvedilol  6.25 mg Oral BID WC  . cloNIDine  0.1 mg Oral BID  . clopidogrel  75 mg Oral Daily  . doxercalciferol      . doxercalciferol  2 mcg Intravenous Q M,W,F-HD  . feeding supplement (NEPRO CARB STEADY)  237 mL Oral BID BM  . ferric gluconate (FERRLECIT/NULECIT) IV  62.5 mg Intravenous Q Fri-HD  . finasteride  5 mg Oral Daily  . gabapentin  100 mg Oral TID  . heparin subcutaneous  5,000 Units Subcutaneous 3 times per day  . hydrALAZINE  50 mg Oral TID  . multivitamin  1 tablet Oral QHS  . nicotine  7 mg Transdermal Daily  . NIFEdipine  90 mg Oral Daily  . omega-3 acid ethyl esters  1 g Oral BID  . pantoprazole  40 mg Oral Daily  . pravastatin  40 mg Oral QPM  . sodium chloride  3 mL Intravenous Q12H  . tamsulosin  0.4 mg Oral BID    OBJECTIVE  Filed Vitals:   08/17/15 0820 08/17/15 0900 08/17/15 0930 08/17/15 1000  BP: 160/75 155/72 162/73 150/73  Pulse: 70 70 68 63  Temp:      TempSrc:      Resp: 15 14 15 16   Height:      Weight:        SpO2:        Intake/Output Summary (Last 24 hours) at 08/17/15 1118 Last data filed at 08/17/15 0754  Gross per 24 hour  Intake    420 ml  Output      0 ml  Net    420 ml   Filed Weights   08/17/15 0525 08/17/15 0545 08/17/15 0800  Weight: 63.6 kg (140 lb 3.4 oz) 62.415 kg (137 lb 9.6 oz) 62.7 kg (138 lb 3.7 oz)    PHYSICAL EXAM  General: Pleasant, NAD. Neuro: Alert and oriented X 3. Moves all extremities spontaneously. Psych: Normal affect. HEENT:  Normal  Neck: Supple without bruits or JVD. Lungs:  Resp regular and unlabored. Diminished breath sound bibasilar.  Heart: RRR no s3, s4. SEM murmurs. Abdomen: Soft, non-tender, non-distended, BS + x 4.  Extremities: No clubbing, cyanosis or edema. DP/PT/Radials 2+ and equal bilaterally.  Accessory Clinical Findings  CBC  Recent Labs  08/14/15 1800 08/17/15 0900  WBC 3.5* 4.0  HGB 9.3* 8.6*  HCT 27.2* 25.1*  MCV 87.7 89.0  PLT 141* 123XX123*   Basic Metabolic Panel  Recent Labs  08/14/15 1800 08/17/15 0900  NA 135 137  K 4.4  4.2  CL 102 102  CO2 24 26  GLUCOSE 130* 116*  BUN 21* 33*  CREATININE 5.61* 7.46*  CALCIUM 8.6* 9.8  PHOS 3.2 3.0   Liver Function Tests  Recent Labs  08/14/15 1800 08/17/15 0900  ALBUMIN 2.9* 2.6*   No results for input(s): LIPASE, AMYLASE in the last 72 hours. Cardiac Enzymes No results for input(s): CKTOTAL, CKMB, CKMBINDEX, TROPONINI in the last 72 hours.  TELE  SR.  No events  Radiology/Studies  Dg Chest 2 View  08/12/2015   CLINICAL DATA:  Chest pain. LEFT-sided chest pain with nausea and shortness of breath for 3 hours.  EXAM: CHEST  2 VIEW  COMPARISON:  Multiple priors dating back to 2013.  FINDINGS: The cardiopericardial silhouette is enlarged. Tortuous thoracic aorta with arch atherosclerosis. No airspace disease or pleural effusion. Prominent nipple shadow projects over the RIGHT lung base.  IMPRESSION: Cardiomegaly without failure.   Electronically Signed   By:  Dereck Ligas M.D.   On: 08/12/2015 18:39   Nm Myocar Multi W/spect W/wall Motion / Ef  08/14/2015   CLINICAL DATA:  Chest pain  EXAM: MYOCARDIAL IMAGING WITH SPECT (REST AND PHARMACOLOGIC-STRESS)  GATED LEFT VENTRICULAR WALL MOTION STUDY  LEFT VENTRICULAR EJECTION FRACTION  TECHNIQUE: Standard myocardial SPECT imaging was performed after resting intravenous injection of 10 mCi Tc-35m sestamibi. Subsequently, intravenous infusion of Lexiscan was performed under the supervision of the Cardiology staff. At peak effect of the drug, 30 mCi Tc-28m sestamibi was injected intravenously and standard myocardial SPECT imaging was performed. Quantitative gated imaging was also performed to evaluate left ventricular wall motion, and estimate left ventricular ejection fraction.  COMPARISON:  None.  FINDINGS: Perfusion: No decreased activity in the left ventricle on stress imaging to suggest reversible ischemia or infarction. Small to medium sized, mild severity, fixed defect in the inferior wall of the left ventricle.  Wall Motion: Hypokinesis of the inferolateral wall. No left ventricular dilation.  Left Ventricular Ejection Fraction: 19 %  End diastolic volume [id number] ml  End systolic volume [id number] ml  IMPRESSION: 1. No reversible ischemia or infarction. Fixed defect along the inferior wall of the left ventricle, as described above, suggesting prior myocardial infarction.  2. Hypokinesis of the inferolateral wall of the left ventricle.  3. Left ventricular ejection fraction 19%  4. High-risk stress test findings*.  *2012 Appropriate Use Criteria for Coronary Revascularization Focused Update: J Am Coll Cardiol. [id number]. http://content.airportbarriers.com.aspx?articleid=1201161   Electronically Signed   By: Julian Hy M.D.   On: 08/14/2015 12:38   Dg Chest Portable 1 View  07/30/2015   CLINICAL DATA:  Intermittent dyspnea and fatigue. Missed dialysis on Saturday.  EXAM: PORTABLE CHEST - 1 VIEW  COMPARISON:   04/03/2015  FINDINGS: There is mild central vascular prominence. No alveolar opacities. No large effusions. Mediastinal and hilar contours appear unremarkable and unchanged.  IMPRESSION: Mild central vascular congestion without alveolar edema.   Electronically Signed   By: Andreas Newport M.D.   On: 07/30/2015 06:16   Echo 08/15/15 LV EF: 55% -  60%  ------------------------------------------------------------------- Indications:   Chest pain 786.51.  ------------------------------------------------------------------- History:  PMH: NICM. Cocaine Abuse, Abdominal Aortic Aneurysm. Atrial fibrillation. Coronary artery disease. Congestive heart failure. Stroke. Transient ischemic attack. PMH:  Myocardial infarction. Risk factors: Current tobacco use.  ------------------------------------------------------------------- Study Conclusions  - Left ventricle: The cavity size was mildly dilated. Wall thickness was increased in a pattern of moderate LVH. Systolic function was normal. The estimated ejection fraction was in the  range of 55% to 60%. Wall motion was normal; there were no regional wall motion abnormalities. Doppler parameters are consistent with abnormal left ventricular relaxation (grade 1 diastolic dysfunction). - Mitral valve: Calcified annulus. Mildly thickened leaflets . Valve area by pressure half-time: 1.9 cm^2. - Atrial septum: No defect or patent foramen ovale was identified.   ASSESSMENT AND PLAN  1. NSTEMI - Trop trend 0.27->0.49->0.47, felt demand ischemia 2nd uncontrolled HTN - Myoview showed no reversible ischemia or infarction. Fixed defect along the inferior wall of the left ventricle, suggesting prior myocardial infarction.  Hypokinesis of the inferolateral wall of the left ventricle.  Left ventricular ejection fraction 19%. High-risk stress test findings. - Echo 04/05/15 showed LV EF of 50%, grade 1 DD, moderate LVH. Repeat echo 08/15/15  is reassuring with  LV EF of 55-60%, moderate LVH, grade 1 DD. No need for further ischemic evaluation. - Cath 07/2014 showed 50-70% left circumflex with no significant obstruction by FFR, 60% diagonal - Cath done 8/7//15 revealed moderate CFX disease with an area of 60% stenosis - Continue ASA, Coreg, plavix - Switch pravastatin to atorvastatin 40mg  for mortality benefit over pravastatin.  LDL at goal. - Recommend increasing carvedilol to 12.5mg  for BP and angina control  2. Hypertension: BP not at goal.  Will increase carvedilol to 12.5mg  bid.  His heart rate should tolerate this change.  Continue clonidine, hydralazine and nifedipine.  3. Tobacco use disorder: advise smoking cessation.   4. Chronic combined systolic and diastolic CHF (congestive heart failure): Volume managed with HD.  Continue carvedilol.  Consider adding ACE-I/ARB.   Signed, Sharol Harness, MD 08/17/2015 11:19 AM

## 2015-08-17 NOTE — Progress Notes (Signed)
CSW (Clinical Education officer, museum) notified facility of plan for weekend dc. They are able to accept pt over the weekend if pt is medically stable for dc.  [place], [place]

## 2015-08-17 NOTE — Progress Notes (Signed)
[facility] PROGRESS NOTE  Trevor [name] [id number] DOB: 03/05/1946 DOA: 08/12/2015 PCP: [name], MD  Assessment/Plan:  Principal Problem:   Chest pain with minimally elevated trop. MV without inducible ischemia, but EF 19% and fixed defect. Last echo 5/16 with EF 50%. Echo shows NORMAL EF.  plan is for pt to go to [name].     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    Anemia   ESRD on hemodialysis: nephrology consulted   Protein-calorie malnutrition, severe   CAD (coronary artery disease)   Malignant hypertension: Improved.  [name] in AM   HPI/Subjective: In dilaysis  Objective: Filed Vitals:   08/17/15 1000  BP: 150/73  Pulse: 63  Temp:   Resp: 16    Intake/Output Summary (Last 24 hours) at 08/17/15 1039 Last data filed at 08/17/15 0754  Gross per 24 hour  Intake    420 ml  Output      0 ml  Net    420 ml   Filed Weights   08/17/15 0525 08/17/15 0545 08/17/15 0800  Weight: 63.6 kg (140 lb 3.4 oz) 62.415 kg (137 lb 9.6 oz) 62.7 kg (138 lb 3.7 oz)    Exam:   General:  Alert, oriented. Comfortable- in dialysis   Basic Metabolic Panel:  Recent Labs Lab 08/12/15 1819 08/13/15 0413 08/14/15 1800 08/17/15 0900  NA 140 138 135 137  K 3.9 3.9 4.4 4.2  CL 103 102 102 102  CO2 26 26 24 26   GLUCOSE 84 96 130* 116*  BUN 26* 33* 21* 33*  CREATININE 7.90* 8.71* 5.61* 7.46*  CALCIUM 9.6 8.8* 8.6* 9.8  PHOS  --   --  3.2 3.0   Liver Function Tests:  Recent Labs Lab 08/14/15 1800 08/17/15 0900  ALBUMIN 2.9* 2.6*   No results for input(s): LIPASE, AMYLASE in the last 168 hours. No results for input(s): AMMONIA in the last 168 hours. CBC:  Recent Labs Lab 08/12/15 1819 08/13/15 0413 08/14/15 1800 08/17/15 0900  WBC 4.5 4.1 3.5* 4.0  HGB 10.3* 8.6* 9.3* 8.6*  HCT 29.7* 24.9* 27.2* 25.1*  MCV 88.9 87.1 87.7 89.0  PLT  141* 130* 141* 108*   Cardiac Enzymes:  Recent Labs Lab 08/12/15 2235 08/13/15 0413 08/13/15 0948  TROPONINI 0.27* 0.49* 0.47*   BNP (last 3 results)  Recent Labs  01/08/15 1929  BNP >4500.0*    ProBNP (last 3 results) No results for input(s): PROBNP in the last 8760 hours.  CBG:  Recent Labs Lab 08/14/15 2035 08/15/15 1205 08/15/15 1616  GLUCAP 81 136* 132*    No results found for this or any previous visit (from the past 240 hour(s)).   Studies: No results found.   Scheduled Meds: . aspirin  81 mg Oral Daily  . calcium acetate  2,001 mg Oral TID WC  . carvedilol  6.25 mg Oral BID WC  . cloNIDine  0.1 mg Oral BID  . clopidogrel  75 mg Oral Daily  . doxercalciferol      . doxercalciferol  2 mcg Intravenous Q M,W,F-HD  . feeding supplement (NEPRO CARB STEADY)  237 mL Oral BID BM  . ferric gluconate (FERRLECIT/NULECIT) IV  62.5 mg Intravenous Q Fri-HD  . finasteride  5 mg Oral Daily  . gabapentin  100 mg Oral TID  . heparin subcutaneous  5,000 Units Subcutaneous 3 times per day  .  hydrALAZINE  50 mg Oral TID  . multivitamin  1 tablet Oral QHS  . nicotine  7 mg Transdermal Daily  . NIFEdipine  90 mg Oral Daily  . omega-3 acid ethyl esters  1 g Oral BID  . pantoprazole  40 mg Oral Daily  . pravastatin  40 mg Oral QPM  . sodium chloride  3 mL Intravenous Q12H  . tamsulosin  0.4 mg Oral BID   Continuous Infusions:    Time spent: 15 minutes  VANN, JESSICA  [facility] [email or web address], password [facility] 08/17/2015, 10:39 AM  LOS: 2 days

## 2015-08-17 NOTE — Progress Notes (Signed)
RN offered to walk with patient after obtaining his weight on the standing scale.  Patient declined at this time.

## 2015-08-17 NOTE — Procedures (Signed)
Tolerating HD,, BP OK off Hytrin.  Hemodynamics stable.  Assessment/Plan: 1. Chest pain - w/ demand ischemia/ EF 55-60% 2. ESRD - MWF - HD Fri per routine 3. Hypertension/volume - OK 4. Anemia - Hgb stable  For [name]  Trevor [name]

## 2015-08-17 NOTE — Care Management Note (Addendum)
Case Management Note  Patient Details  Name: MOUA BROSKI MRN: [id number] Date of Birth: Feb 17, 1946  Subjective/Objective: Pt admitted for chest pain. Pt has Hx ESRD- HD MWF. Plan is for [name] once stable.                    Action/Plan: CSW assisting with disposition needs. Plan for d/c to [facility]. No further needs from CM at this time.   Expected Discharge Date:                  Expected Discharge Plan:  Skilled Nursing Facility  In-House Referral:  Clinical Social Work  Discharge planning Services  CM Consult  Post Acute Care Choice:  NA Choice offered to:  NA  DME Arranged:  N/A DME Agency:  NA  HH Arranged:  NA HH Agency:  NA  Status of Service:  Completed, signed off  Medicare Important Message Given:    Date Medicare IM Given:    Medicare IM give by:    Date Additional Medicare IM Given:    Additional Medicare Important Message give by:     If discussed at [place] of Stay Meetings, dates discussed:    Additional Comments:  Bethena Roys, RN 08/17/2015, 11:40 AM

## 2015-08-18 DIAGNOSIS — R262 Difficulty in walking, not elsewhere classified: Secondary | ICD-10-CM | POA: Diagnosis not present

## 2015-08-18 DIAGNOSIS — I6990 Unspecified sequelae of unspecified cerebrovascular disease: Secondary | ICD-10-CM | POA: Diagnosis not present

## 2015-08-18 DIAGNOSIS — N400 Enlarged prostate without lower urinary tract symptoms: Secondary | ICD-10-CM | POA: Diagnosis not present

## 2015-08-18 DIAGNOSIS — I509 Heart failure, unspecified: Secondary | ICD-10-CM | POA: Diagnosis not present

## 2015-08-18 DIAGNOSIS — E43 Unspecified severe protein-calorie malnutrition: Secondary | ICD-10-CM | POA: Diagnosis not present

## 2015-08-18 DIAGNOSIS — M6281 Muscle weakness (generalized): Secondary | ICD-10-CM | POA: Diagnosis not present

## 2015-08-18 DIAGNOSIS — Z9119 Patient's noncompliance with other medical treatment and regimen: Secondary | ICD-10-CM | POA: Diagnosis not present

## 2015-08-18 DIAGNOSIS — N186 End stage renal disease: Secondary | ICD-10-CM | POA: Diagnosis not present

## 2015-08-18 DIAGNOSIS — J9620 Acute and chronic respiratory failure, unspecified whether with hypoxia or hypercapnia: Secondary | ICD-10-CM | POA: Diagnosis not present

## 2015-08-18 DIAGNOSIS — I10 Essential (primary) hypertension: Secondary | ICD-10-CM | POA: Diagnosis not present

## 2015-08-18 DIAGNOSIS — E1129 Type 2 diabetes mellitus with other diabetic kidney complication: Secondary | ICD-10-CM | POA: Diagnosis not present

## 2015-08-18 DIAGNOSIS — Z992 Dependence on renal dialysis: Secondary | ICD-10-CM | POA: Diagnosis not present

## 2015-08-18 DIAGNOSIS — I5040 Unspecified combined systolic (congestive) and diastolic (congestive) heart failure: Secondary | ICD-10-CM | POA: Diagnosis not present

## 2015-08-18 DIAGNOSIS — G629 Polyneuropathy, unspecified: Secondary | ICD-10-CM | POA: Diagnosis not present

## 2015-08-18 DIAGNOSIS — K219 Gastro-esophageal reflux disease without esophagitis: Secondary | ICD-10-CM | POA: Diagnosis not present

## 2015-08-18 DIAGNOSIS — Z794 Long term (current) use of insulin: Secondary | ICD-10-CM | POA: Diagnosis not present

## 2015-08-18 DIAGNOSIS — E785 Hyperlipidemia, unspecified: Secondary | ICD-10-CM | POA: Diagnosis not present

## 2015-08-18 DIAGNOSIS — I427 Cardiomyopathy due to drug and external agent: Secondary | ICD-10-CM | POA: Diagnosis not present

## 2015-08-18 DIAGNOSIS — I5042 Chronic combined systolic (congestive) and diastolic (congestive) heart failure: Secondary | ICD-10-CM | POA: Diagnosis not present

## 2015-08-18 DIAGNOSIS — I2510 Atherosclerotic heart disease of native coronary artery without angina pectoris: Secondary | ICD-10-CM | POA: Diagnosis not present

## 2015-08-18 DIAGNOSIS — W19XXXA Unspecified fall, initial encounter: Secondary | ICD-10-CM | POA: Diagnosis not present

## 2015-08-18 DIAGNOSIS — M79621 Pain in right upper arm: Secondary | ICD-10-CM | POA: Diagnosis not present

## 2015-08-18 DIAGNOSIS — I429 Cardiomyopathy, unspecified: Secondary | ICD-10-CM | POA: Diagnosis not present

## 2015-08-18 DIAGNOSIS — I5020 Unspecified systolic (congestive) heart failure: Secondary | ICD-10-CM | POA: Diagnosis not present

## 2015-08-18 DIAGNOSIS — S39012A Strain of muscle, fascia and tendon of lower back, initial encounter: Secondary | ICD-10-CM | POA: Diagnosis not present

## 2015-08-18 DIAGNOSIS — I639 Cerebral infarction, unspecified: Secondary | ICD-10-CM | POA: Diagnosis not present

## 2015-08-18 DIAGNOSIS — G459 Transient cerebral ischemic attack, unspecified: Secondary | ICD-10-CM | POA: Diagnosis not present

## 2015-08-18 DIAGNOSIS — R072 Precordial pain: Secondary | ICD-10-CM | POA: Diagnosis not present

## 2015-08-18 MED ORDER — PANTOPRAZOLE SODIUM 40 MG PO TBEC: 40.0000 mg | DELAYED_RELEASE_TABLET | Freq: Every day | ORAL | Status: DC

## 2015-08-18 MED ORDER — ASPIRIN 81 MG PO CHEW: 81.0000 mg | CHEWABLE_TABLET | Freq: Every day | ORAL | Status: DC

## 2015-08-18 MED ORDER — ATORVASTATIN CALCIUM 40 MG PO TABS: 40.0000 mg | ORAL_TABLET | Freq: Every day | ORAL | Status: DC

## 2015-08-18 MED ORDER — CARVEDILOL 12.5 MG PO TABS: 12.5000 mg | ORAL_TABLET | Freq: Two times a day (BID) | ORAL | Status: DC

## 2015-08-18 NOTE — Progress Notes (Signed)
Patient for d/c today to [name] bed at [facility]. Family and patient agreeable to this plan- daughter Maree Erie advised of need to arrange for [facility]AT or other means to transport to/from HD.  plan transfer via EMS. Eduard Clos, MSW, [place] Weekend [phone]

## 2015-08-18 NOTE — Discharge Summary (Signed)
Physician Discharge Summary  Trevor [name] [id number] DOB: [birth year] DOA: 08/12/2015  PCP: [name], MD  Admit date: 08/12/2015 Discharge date: 08/18/2015  Time spent: 35 minutes  Recommendations for Outpatient Follow-up:  1. To [name] 2. HD 3. MWF  Discharge Diagnoses:  Principal Problem:   Chest pain Active Problems:   Tobacco use disorder   History of stroke   Chronic combined systolic and diastolic CHF (congestive heart failure)   Anemia   ESRD on hemodialysis   Protein-calorie malnutrition, severe   CAD (coronary artery disease)   Malignant hypertension   Essential hypertension   Discharge Condition: improved  Diet recommendation: renal  Filed Weights   08/17/15 0800 08/17/15 1145 08/18/15 0500  Weight: 62.7 kg (138 lb 3.7 oz) 61.2 kg (134 lb 14.7 oz) 57.652 kg (127 lb 1.6 oz)    History of present illness:  Trevor [name] is a 69 y.o. male with a history of CAD, HTN, Combined Systolic and Diastolic CHF, ESRD on HD ( MWF) who presents to the ED with complaints of Left Sided Chest pain described as heaviness rated at 9/10 associated with  SOB and lasted for 3 hours. He denies any Nausea Vomiting or Diaphoresis. He was evaluated in the ED and found to have a Troponin of 0.09, and S-T depression on EKG. He was referred for observation.  Hospital Course:  Chest pain with minimally elevated trop. MV without inducible ischemia, but EF 19% and fixed defect. Last echo 5/16 with EF 50%. Echo again shows NORMAL EF.  plan is for pt to go to [name].  -coreg   Tobacco use disorder: Counseled against  History of stroke  Chronic combined systolic and diastolic CHF (congestive heart failure): EF now normal, volume controlled with dialysis, no ACE due to renal function   Anemia  ESRD on hemodialysis: HD MWF  Protein-calorie malnutrition, severe  CAD (coronary artery disease)  Malignant hypertension: Improved- on multiple  medications  Procedures:    Consultations:  Cards  nephro  Discharge Exam: Filed Vitals:   08/18/15 0500  BP: 110/45  Pulse: 62  Temp: 97.7 F (36.5 C)  Resp: 18    General: awake, NAD   Discharge Instructions   Discharge Instructions    Discharge instructions    Complete by:  As directed   Continue HD M/W/F Renal diet     Increase activity slowly    Complete by:  As directed           Current Discharge Medication List    START taking these medications   Details  aspirin 81 MG chewable tablet Chew 1 tablet (81 mg total) by mouth daily.    atorvastatin (LIPITOR) 40 MG tablet Take 1 tablet (40 mg total) by mouth daily at 6 PM.    carvedilol (COREG) 12.5 MG tablet Take 1 tablet (12.5 mg total) by mouth 2 (two) times daily with a meal.    pantoprazole (PROTONIX) 40 MG tablet Take 1 tablet (40 mg total) by mouth daily.      CONTINUE these medications which have NOT CHANGED   Details  B Complex-C-Folic Acid (DIALYVITE TABLET) TABS Take 1 tablet by mouth daily. Refills: 0    calcium acetate (PHOSLO) 667 MG capsule Take 667 mg by mouth 3 (three) times daily with meals.    cloNIDine (CATAPRES) 0.1 MG tablet Take 0.1 mg by mouth 2 (two) times daily. Refills: 0    clopidogrel (PLAVIX) 75 MG tablet Take 1 tablet (75 mg total) by  mouth daily. Qty: 30 tablet, Refills: 2    finasteride (PROSCAR) 5 MG tablet Take 5 mg by mouth daily. Refills: 0    gabapentin (NEURONTIN) 100 MG capsule Take 1 capsule (100 mg total) by mouth 3 (three) times daily. Qty: 90 capsule, Refills: 2    hydrALAZINE (APRESOLINE) 50 MG tablet Take 1 tablet (50 mg total) by mouth 3 (three) times daily. Qty: 90 tablet, Refills: 3    multivitamin (RENA-VIT) TABS tablet Take 1 tablet by mouth daily. Refills: 0    nicotine (NICODERM CQ - DOSED IN MG/24 HOURS) 21 mg/24hr patch Place 1 patch (21 mg total) onto the skin daily. Qty: 28 patch, Refills: 0    NIFEdipine (PROCARDIA XL/ADALAT-CC)  90 MG 24 hr tablet Take 1 tablet (90 mg total) by mouth daily. Qty: 30 tablet, Refills: 2    Nutritional Supplements (FEEDING SUPPLEMENT, NEPRO CARB STEADY,) LIQD Take 237 mLs by mouth 2 (two) times daily between meals. Refills: 0    omega-3 acid ethyl esters (LOVAZA) 1 G capsule Take 1 capsule (1 g total) by mouth 2 (two) times daily. Qty: 60 capsule, Refills: 2    tamsulosin (FLOMAX) 0.4 MG CAPS capsule Take 0.4 mg by mouth 2 (two) times daily. Refills: 0      STOP taking these medications     metoprolol tartrate (LOPRESSOR) 25 MG tablet      OVER THE COUNTER MEDICATION      pravastatin (PRAVACHOL) 40 MG tablet      terazosin (HYTRIN) 2 MG capsule        No Known Allergies Follow-up Information    Follow up with Richardson Dopp, PA-C On 08/28/2015.   Specialties:  Physician Assistant, Radiology, Interventional Cardiology   Why:  @ 8am   Contact information:   [id number] [address] [phone]       Follow up with COLADONATO,JOSEPH A, MD In 1 week.   Specialty:  Nephrology   Contact information:   [place] [place] [address] [phone]        The results of significant diagnostics from this hospitalization (including imaging, microbiology, ancillary and laboratory) are listed below for reference.    Significant Diagnostic Studies: Dg Chest 2 View  08/12/2015   CLINICAL DATA:  Chest pain. LEFT-sided chest pain with nausea and shortness of breath for 3 hours.  EXAM: CHEST  2 VIEW  COMPARISON:  Multiple priors dating back to 2013.  FINDINGS: The cardiopericardial silhouette is enlarged. Tortuous thoracic aorta with arch atherosclerosis. No airspace disease or pleural effusion. Prominent nipple shadow projects over the RIGHT lung base.  IMPRESSION: Cardiomegaly without failure.   Electronically Signed   By: Dereck Ligas M.D.   On: 08/12/2015 18:39   Nm Myocar Multi W/spect W/wall Motion / Ef  08/14/2015   CLINICAL DATA:  Chest  pain  EXAM: MYOCARDIAL IMAGING WITH SPECT (REST AND PHARMACOLOGIC-STRESS)  GATED LEFT VENTRICULAR WALL MOTION STUDY  LEFT VENTRICULAR EJECTION FRACTION  TECHNIQUE: Standard myocardial SPECT imaging was performed after resting intravenous injection of 10 mCi Tc-63m sestamibi. Subsequently, intravenous infusion of Lexiscan was performed under the supervision of the Cardiology staff. At peak effect of the drug, 30 mCi Tc-13m sestamibi was injected intravenously and standard myocardial SPECT imaging was performed. Quantitative gated imaging was also performed to evaluate left ventricular wall motion, and estimate left ventricular ejection fraction.  COMPARISON:  None.  FINDINGS: Perfusion: No decreased activity in the left ventricle on stress imaging to suggest  reversible ischemia or infarction. Small to medium sized, mild severity, fixed defect in the inferior wall of the left ventricle.  Wall Motion: Hypokinesis of the inferolateral wall. No left ventricular dilation.  Left Ventricular Ejection Fraction: 19 %  End diastolic volume [id number] ml  End systolic volume [id number] ml  IMPRESSION: 1. No reversible ischemia or infarction. Fixed defect along the inferior wall of the left ventricle, as described above, suggesting prior myocardial infarction.  2. Hypokinesis of the inferolateral wall of the left ventricle.  3. Left ventricular ejection fraction 19%  4. High-risk stress test findings*.  *2012 Appropriate Use Criteria for Coronary Revascularization Focused Update: J Am Coll Cardiol. [id number]. http://content.airportbarriers.com.aspx?articleid=1201161   Electronically Signed   By: Julian Hy M.D.   On: 08/14/2015 12:38   Dg Chest Portable 1 View  07/30/2015   CLINICAL DATA:  Intermittent dyspnea and fatigue. Missed dialysis on Saturday.  EXAM: PORTABLE CHEST - 1 VIEW  COMPARISON:  04/03/2015  FINDINGS: There is mild central vascular prominence. No alveolar opacities. No large effusions. Mediastinal and  hilar contours appear unremarkable and unchanged.  IMPRESSION: Mild central vascular congestion without alveolar edema.   Electronically Signed   By: Andreas Newport M.D.   On: 07/30/2015 06:16    Microbiology: No results found for this or any previous visit (from the past 240 hour(s)).   Labs: Basic Metabolic Panel:  Recent Labs Lab 08/12/15 1819 08/13/15 0413 08/14/15 1800 08/17/15 0900  NA 140 138 135 137  K 3.9 3.9 4.4 4.2  CL 103 102 102 102  CO2 26 26 24 26   GLUCOSE 84 96 130* 116*  BUN 26* 33* 21* 33*  CREATININE 7.90* 8.71* 5.61* 7.46*  CALCIUM 9.6 8.8* 8.6* 9.8  PHOS  --   --  3.2 3.0   Liver Function Tests:  Recent Labs Lab 08/14/15 1800 08/17/15 0900  ALBUMIN 2.9* 2.6*   No results for input(s): LIPASE, AMYLASE in the last 168 hours. No results for input(s): AMMONIA in the last 168 hours. CBC:  Recent Labs Lab 08/12/15 1819 08/13/15 0413 08/14/15 1800 08/17/15 0900  WBC 4.5 4.1 3.5* 4.0  HGB 10.3* 8.6* 9.3* 8.6*  HCT 29.7* 24.9* 27.2* 25.1*  MCV 88.9 87.1 87.7 89.0  PLT 141* 130* 141* 108*   Cardiac Enzymes:  Recent Labs Lab 08/12/15 2235 08/13/15 0413 08/13/15 0948  TROPONINI 0.27* 0.49* 0.47*   BNP: BNP (last 3 results)  Recent Labs  01/08/15 1929  BNP >4500.0*    ProBNP (last 3 results) No results for input(s): PROBNP in the last 8760 hours.  CBG:  Recent Labs Lab 08/14/15 2035 08/15/15 1205 08/15/15 1616  GLUCAP 81 136* 132*       Signed:  Izek Corvino  [facility] 08/18/2015, 10:18 AM

## 2015-08-18 NOTE — Progress Notes (Signed)
Assumed care from off going RN @ [phone]; patient alert & oriented times four; c/o's being weak, fatigue, and "not feeling well"; patient resting in bed with call bell in reach; NSR on monitor

## 2015-08-18 NOTE — Progress Notes (Signed)
[facility] Progress Note  Assessment/Plan: 1. Chest pain - demand ischemia with slightly increased tropinon. MV without inducible ischemia, but EF 19% and fixed defect. DC [name] today.  2. ESRD - MWF - HD yesterday with -1.5L 3. Hypertension/volume - now off Hytrin 4. Anemia - Hgb dropped to 8.6 since admission -remains 8.6. Will start ESA therapy OP.  5. Metabolic bone disease - Continue hectorol, increase phoslo to 3 ac 6. Nutrition - renal carb mod diet/vit - add supplements;  7. DM - per primary 8. Mild Thrombocytopenia - follow 9. Right leg weakness with gait instability - Will [name]/O weakness. Going to [name]. Has history of falls at home.  10. Tobacco abuse - encourage cessation 11.  [name] NP-[name] 08/18/2015, 12:23 PM  [facility] [phone]  Renal Attending: He is overall improved.  BP ok off Hytrin.  WIll cont to follow and wean polypharmacy if appropriate. Trevor [name],Trevor [name]   Subjective:   "I'm still weak". Seems excited to be getting out of hospital.   Objective Filed Vitals:   08/17/15 1145 08/17/15 1302 08/17/15 2002 08/18/15 0500  BP: 115/54 165/57 154/59 110/45  Pulse: 60 67 71 62  Temp: 97.6 F (36.4 [name]) 98.6 F (37 [name]) 100.2 F (37.9 [name]) 97.7 F (36.5 [name])  TempSrc: Oral Oral Oral Oral  Resp: 16 16  18   Height:      Weight: 61.2 kg (134 lb 14.7 oz)   57.652 kg (127 lb 1.6 oz)  SpO2: 99% 100% 100% 100%   Physical Exam General: Chronically ill thin male, NAD. Heart:S1,S2, RRR.  SR on monitor.  Lungs: Bilateral breath sounds CTA, A/P.  Abdomen: active BS. Nontender, nondistended. Extremities: No edema. Dialysis Access:left upper AVF + bruit  Dialysis Orders: Center: [facility] 4 hr 180 EDW 59.5 2 K 2.25 Ca profile 4 left upper AVF Hectorol 2 heparin 5900 venofer 50 on Fri and Mircera 50 q 2 weeks last 9/7  Additional Objective Labs: Basic Metabolic Panel:  Recent Labs Lab 08/13/15 0413 08/14/15 1800 08/17/15 0900  NA 138  135 137  K 3.9 4.4 4.2  CL 102 102 102  CO2 26 24 26   GLUCOSE 96 130* 116*  BUN 33* 21* 33*  CREATININE 8.71* 5.61* 7.46*  CALCIUM 8.8* 8.6* 9.8  PHOS  --  3.2 3.0   Liver Function Tests:  Recent Labs Lab 08/14/15 1800 08/17/15 0900  ALBUMIN 2.9* 2.6*   No results for input(s): LIPASE, AMYLASE in the last 168 hours. CBC:  Recent Labs Lab 08/12/15 1819 08/13/15 0413 08/14/15 1800 08/17/15 0900  WBC 4.5 4.1 3.5* 4.0  HGB 10.3* 8.6* 9.3* 8.6*  HCT 29.7* 24.9* 27.2* 25.1*  MCV 88.9 87.1 87.7 89.0  PLT 141* 130* 141* 108*   Blood Culture    Component Value Date/Time   SDES BLOOD RIGHT FOREARM 09/08/2014 0340   SPECREQUEST BOTTLES DRAWN AEROBIC AND ANAEROBIC 10CC EACH 09/08/2014 0340   CULT  09/08/2014 0340    NO GROWTH 5 DAYS Performed at [place] 09/14/2014 FINAL 09/08/2014 0340    Cardiac Enzymes:  Recent Labs Lab 08/12/15 2235 08/13/15 0413 08/13/15 0948  TROPONINI 0.27* 0.49* 0.47*   CBG:  Recent Labs Lab 08/14/15 2035 08/15/15 1205 08/15/15 1616  GLUCAP 81 136* 132*   Iron Studies: No results for input(s): IRON, TIBC, TRANSFERRIN, FERRITIN in the last 72 hours. @lablastinr3 @ Studies/Results: No results found. Medications:   . aspirin  81 mg Oral Daily  .  atorvastatin  40 mg Oral q1800  . calcium acetate  2,001 mg Oral TID WC  . carvedilol  12.5 mg Oral BID WC  . cloNIDine  0.1 mg Oral BID  . clopidogrel  75 mg Oral Daily  . doxercalciferol  2 mcg Intravenous Q M,W,F-HD  . feeding supplement (NEPRO CARB STEADY)  237 mL Oral BID BM  . ferric gluconate (FERRLECIT/NULECIT) IV  62.5 mg Intravenous Q Fri-HD  . finasteride  5 mg Oral Daily  . gabapentin  100 mg Oral TID  . heparin subcutaneous  5,000 Units Subcutaneous 3 times per day  . hydrALAZINE  50 mg Oral TID  . multivitamin  1 tablet Oral QHS  . nicotine  7 mg Transdermal Daily  . NIFEdipine  90 mg Oral Daily  . omega-3 acid ethyl esters  1 g Oral BID  .  pantoprazole  40 mg Oral Daily  . sodium chloride  3 mL Intravenous Q12H  . tamsulosin  0.4 mg Oral BID

## 2015-08-20 DIAGNOSIS — M6281 Muscle weakness (generalized): Secondary | ICD-10-CM | POA: Diagnosis not present

## 2015-08-20 DIAGNOSIS — W19XXXA Unspecified fall, initial encounter: Secondary | ICD-10-CM | POA: Diagnosis not present

## 2015-08-20 DIAGNOSIS — J9620 Acute and chronic respiratory failure, unspecified whether with hypoxia or hypercapnia: Secondary | ICD-10-CM | POA: Diagnosis not present

## 2015-08-20 DIAGNOSIS — S39012A Strain of muscle, fascia and tendon of lower back, initial encounter: Secondary | ICD-10-CM | POA: Diagnosis not present

## 2015-08-21 DIAGNOSIS — M6281 Muscle weakness (generalized): Secondary | ICD-10-CM | POA: Diagnosis not present

## 2015-08-21 DIAGNOSIS — I6990 Unspecified sequelae of unspecified cerebrovascular disease: Secondary | ICD-10-CM | POA: Diagnosis not present

## 2015-08-21 DIAGNOSIS — M79621 Pain in right upper arm: Secondary | ICD-10-CM | POA: Diagnosis not present

## 2015-08-21 DIAGNOSIS — R262 Difficulty in walking, not elsewhere classified: Secondary | ICD-10-CM | POA: Diagnosis not present

## 2015-08-23 DIAGNOSIS — M6281 Muscle weakness (generalized): Secondary | ICD-10-CM | POA: Diagnosis not present

## 2015-08-23 DIAGNOSIS — I2510 Atherosclerotic heart disease of native coronary artery without angina pectoris: Secondary | ICD-10-CM | POA: Diagnosis not present

## 2015-08-23 DIAGNOSIS — I429 Cardiomyopathy, unspecified: Secondary | ICD-10-CM | POA: Diagnosis not present

## 2015-08-23 DIAGNOSIS — M79621 Pain in right upper arm: Secondary | ICD-10-CM | POA: Diagnosis not present

## 2015-08-23 DIAGNOSIS — I6990 Unspecified sequelae of unspecified cerebrovascular disease: Secondary | ICD-10-CM | POA: Diagnosis not present

## 2015-08-23 DIAGNOSIS — R262 Difficulty in walking, not elsewhere classified: Secondary | ICD-10-CM | POA: Diagnosis not present

## 2015-08-23 DIAGNOSIS — N186 End stage renal disease: Secondary | ICD-10-CM | POA: Diagnosis not present

## 2015-08-23 DIAGNOSIS — I5020 Unspecified systolic (congestive) heart failure: Secondary | ICD-10-CM | POA: Diagnosis not present

## 2015-08-27 NOTE — Progress Notes (Signed)
Cardiology Office Note   Date:  08/28/2015   ID:  Trevor [name], DOB 03/08/1946, MRN [id number]  PCP:  [name], MD  Cardiologist:  Dr. Kirk Ruths   Electrophysiologist:  n/a  No chief complaint on file.    History of Present Illness: Trevor [name] is a 69 y.o. male with a hx of moderate nonobstructive CAD, AAA, LV dysfunction with EF improved to normal, combined systolic and diastolic HF, ESRD, remote substance abuse. Abdominal ultrasound in 4/16 with 2.6 x 2.7 cm distal AAA. Echo in 6/15 with EF 40%, moderate LVH and grade 1 diastolic dysfunction. Cardiac catheterization 8/15 with normal LV function, D1 60%, LCx 50-70% (FFR negative), RCA 50%. Last seen by Dr. Stanford Breed 4/16.  Admitted 9/11-9/17 with chest pain and minimally elevated troponin levels in the setting of uncontrolled hypertension. He was followed by cardiology. Inpatient nuclear study demonstrated no ischemia. There was a fixed defect along the inferior wall suggestive of prior MI with inferolateral HK. Study was felt to be high risk given EF 19%. Echocardiogram however demonstrated EF 55-60% with normal wall motion. Elevated troponin levels are felt to be related to demand ischemia. He was DC to [name].  Returns for follow-up.      Studies/Reports Reviewed Today:  Echo 08/15/15 Moderate LVH, EF 55-60%, normal wall motion, grade 1 diastolic dysfunction, MAC  Myoview 9/16 No ischemia or scar with fixed defect along the inferior wall suggestive of prior MI, inferolateral HK, EF 19%, high risk findings  LHC 07/07/14 EF 55-60% LM: Okay LAD: Mid 30% at DX, D1 60% proximal with 2 subbranches 30% each LCx: 50-70% RCA: Mid 50% FFR of LCx 0.88-not physiologically significant POST-OPERATIVE DIAGNOSIS:   Moderate to severe single-vessel disease involving the early mid circumflex with a roughly 60% stenosis, not physiologically significant by FFR. Otherwise nonobstructive CAD throughout  Surprisingly  improved LVEF by the ventriculography compared to previous evaluations  Normal LVEDP with borderline systemic hypotension  Carotid US 04/05/15 - Right - 40% to 59% ICA stenosis. - Left - 1% to 39% ICA stenosis upper end of range.  Abd [place] 4/16 Distal Abd Aorta 2.6 x 2.7 cm R CIA 1.8 x 1.7 cm  Past Medical History  Diagnosis Date  . HTN (hypertension)   . Anxiety   . Cardiomyopathy   . Renal insufficiency   . Myocardial infarction   . CVA (cerebral infarction)   . Chest pain   . Shortness of breath   . Headache(784.0)   . Arthritis   . Blindness     Past Surgical History  Procedure Laterality Date  . Tee without cardioversion  10/09/2011    Procedure: TRANSESOPHAGEAL ECHOCARDIOGRAM (TEE);  Surgeon: Lelon Perla, MD;  Location: [facility] ENDOSCOPY;  Service: Cardiovascular;  Laterality: N/A;  . Right hand    . Insertion of dialysis catheter  09/21/2012    Procedure: INSERTION OF DIALYSIS CATHETER;  Surgeon: Angelia Mould, MD;  Location: [place];  Service: Vascular;  Laterality: N/A;  Right Internal Jugular Placement  . Left heart catheterization with coronary angiogram N/A 09/28/2012    Procedure: LEFT HEART CATHETERIZATION WITH CORONARY ANGIOGRAM;  Surgeon: Sherren Mocha, MD;  Location: [facility] CATH LAB;  Service: Cardiovascular;  Laterality: N/A;  . Left heart catheterization with coronary angiogram N/A 07/07/2014    Procedure: LEFT HEART CATHETERIZATION WITH CORONARY ANGIOGRAM;  Surgeon: Leonie Man, MD;  Location: [facility] CATH LAB;  Service: Cardiovascular;  Laterality: N/A;     Current Outpatient Prescriptions  Medication Sig Dispense Refill  . aspirin 81 MG chewable tablet Chew 1 tablet (81 mg total) by mouth daily.    Marland Kitchen atorvastatin (LIPITOR) 40 MG tablet Take 1 tablet (40 mg total) by mouth daily at 6 PM.    . B Complex-C-Folic Acid (DIALYVITE TABLET) TABS Take 1 tablet by mouth daily.  0  . calcium acetate (PHOSLO) 667 MG capsule Take 667 mg by mouth 3 (three) times daily  with meals.    . carvedilol (COREG) 12.5 MG tablet Take 1 tablet (12.5 mg total) by mouth 2 (two) times daily with a meal.    . cloNIDine (CATAPRES) 0.1 MG tablet Take 0.1 mg by mouth 2 (two) times daily.  0  . clopidogrel (PLAVIX) 75 MG tablet Take 1 tablet (75 mg total) by mouth daily. 30 tablet 2  . finasteride (PROSCAR) 5 MG tablet Take 5 mg by mouth daily.  0  . gabapentin (NEURONTIN) 100 MG capsule Take 1 capsule (100 mg total) by mouth 3 (three) times daily. 90 capsule 2  . hydrALAZINE (APRESOLINE) 50 MG tablet Take 1 tablet (50 mg total) by mouth 3 (three) times daily. 90 tablet 3  . multivitamin (RENA-VIT) TABS tablet Take 1 tablet by mouth daily.  0  . nicotine (NICODERM CQ - DOSED IN MG/24 HOURS) 21 mg/24hr patch Place 1 patch (21 mg total) onto the skin daily. (Patient not taking: Reported on 07/30/2015) 28 patch 0  . NIFEdipine (PROCARDIA XL/ADALAT-CC) 90 MG 24 hr tablet Take 1 tablet (90 mg total) by mouth daily. 30 tablet 2  . Nutritional Supplements (FEEDING SUPPLEMENT, NEPRO CARB STEADY,) LIQD Take 237 mLs by mouth 2 (two) times daily between meals. (Patient not taking: Reported on 07/30/2015)  0  . omega-3 acid ethyl esters (LOVAZA) 1 G capsule Take 1 capsule (1 g total) by mouth 2 (two) times daily. 60 capsule 2  . pantoprazole (PROTONIX) 40 MG tablet Take 1 tablet (40 mg total) by mouth daily.    . tamsulosin (FLOMAX) 0.4 MG CAPS capsule Take 0.4 mg by mouth 2 (two) times daily.  0   No current facility-administered medications for this visit.    Allergies:   Review of patient's allergies indicates no known allergies.    Social History:  The patient  reports that he has been smoking Cigarettes.  He has a 25 pack-year smoking history. He has never used smokeless tobacco. He reports that he does not drink alcohol or use illicit drugs.   Family History:  The patient's family history includes Alcohol abuse in his father; Diabetes in his mother; Heart attack in his mother. There  is no history of Anesthesia problems, Hypotension, Malignant hyperthermia, or Pseudochol deficiency.    ROS:   Please see the history of present illness.   ROS    PHYSICAL EXAM: VS:  There were no vitals taken for this visit.    Wt Readings from Last 3 Encounters:  08/18/15 127 lb 1.6 oz (57.652 kg)  08/07/15 133 lb (60.328 kg)  04/05/15 138 lb 0.1 oz (62.6 kg)     GEN: Well nourished, well developed, in no acute distress HEENT: normal Neck: no JVD, no carotid bruits, no masses Cardiac:  Normal S1/S2, RRR; no murmur ,  no rubs or gallops, no edema  Respiratory:  clear to auscultation bilaterally, no wheezing, rhonchi or rales. GI: soft, nontender, nondistended, + BS MS: no deformity or atrophy Skin: warm and dry  Neuro:  CNs II-XII intact, Strength and sensation are  intact Psych: Normal affect   EKG:  EKG is ordered today.  It demonstrates:      Recent Labs: 01/08/2015: ALT 10; B Natriuretic Peptide >4500.0* 04/04/2015: TSH 1.549 08/17/2015: BUN 33*; Creatinine, Ser 7.46*; Hemoglobin 8.6*; Platelets 108*; Potassium 4.2; Sodium 137    Lipid Panel    Component Value Date/Time   CHOL 77 08/17/2015 0357   TRIG 74 08/17/2015 0357   HDL 27* 08/17/2015 0357   CHOLHDL 2.9 08/17/2015 0357   VLDL 15 08/17/2015 0357   LDLCALC 35 08/17/2015 0357      ASSESSMENT AND PLAN:  1. CAD:  Patient with history of moderate nonobstructive CAD by cardiac catheterization in 8/15. Recent admission with chest pain and elevated troponin levels felt to be related to demand ischemia in the setting of uncontrolled hypertension. Myoview demonstrated no ischemia. EF was low but follow-up echocardiogram demonstrated normal LV function with mild diastolic dysfunction. Continue medical therapy was recommended.    2. Hypertension:    3. Chronic, Primarily, Diastolic CHF: Volume management per hemodialysis.  4. AAA: Mildly enlarged abdominal aorta by ultrasound 4/16.  5. Tobacco abuse: Cessation has  been recommended.  6. Hyperlipidemia: LDL 9/16 was 35. Continue Lipitor 40.  7. Carotid Stenosis: Plan follow-up carotid US 5/17.     Medication Changes: Current medicines are reviewed at length with the patient today.  Concerns regarding medicines are as outlined above.  The following changes have been made:   Discontinued Medications   No medications on file   Modified Medications   No medications on file   New Prescriptions   No medications on file    Labs/ tests ordered today include:   No orders of the defined types were placed in this encounter.      Disposition:    FU with    Signed, Richardson Dopp, PA-C, MHS 08/28/2015 7:53 AM    [address], [place], [place]  [id number] Phone: [phone]; Fax: [phone]    This encounter was created in error - please disregard.

## 2015-08-28 ENCOUNTER — Encounter: Payer: Medicare Other | Admitting: Physician Assistant

## 2015-08-31 ENCOUNTER — Encounter: Payer: Self-pay | Admitting: Physician Assistant

## 2015-09-04 DIAGNOSIS — M6281 Muscle weakness (generalized): Secondary | ICD-10-CM | POA: Diagnosis not present

## 2015-09-04 DIAGNOSIS — I6990 Unspecified sequelae of unspecified cerebrovascular disease: Secondary | ICD-10-CM | POA: Diagnosis not present

## 2015-09-04 DIAGNOSIS — R262 Difficulty in walking, not elsewhere classified: Secondary | ICD-10-CM | POA: Diagnosis not present

## 2015-09-04 DIAGNOSIS — M79621 Pain in right upper arm: Secondary | ICD-10-CM | POA: Diagnosis not present

## 2015-09-18 DIAGNOSIS — G459 Transient cerebral ischemic attack, unspecified: Secondary | ICD-10-CM | POA: Diagnosis not present

## 2015-09-18 DIAGNOSIS — N400 Enlarged prostate without lower urinary tract symptoms: Secondary | ICD-10-CM | POA: Diagnosis not present

## 2015-09-18 DIAGNOSIS — E785 Hyperlipidemia, unspecified: Secondary | ICD-10-CM | POA: Diagnosis not present

## 2015-09-18 DIAGNOSIS — K219 Gastro-esophageal reflux disease without esophagitis: Secondary | ICD-10-CM | POA: Diagnosis not present

## 2015-09-18 DIAGNOSIS — I509 Heart failure, unspecified: Secondary | ICD-10-CM | POA: Diagnosis not present

## 2015-09-18 DIAGNOSIS — I10 Essential (primary) hypertension: Secondary | ICD-10-CM | POA: Diagnosis not present

## 2015-09-18 DIAGNOSIS — I2510 Atherosclerotic heart disease of native coronary artery without angina pectoris: Secondary | ICD-10-CM | POA: Diagnosis not present

## 2015-09-18 DIAGNOSIS — N186 End stage renal disease: Secondary | ICD-10-CM | POA: Diagnosis not present

## 2015-12-26 ENCOUNTER — Emergency Department (HOSPITAL_COMMUNITY): Payer: Medicare Other

## 2015-12-26 ENCOUNTER — Encounter (HOSPITAL_COMMUNITY): Payer: Self-pay | Admitting: Emergency Medicine

## 2015-12-26 ENCOUNTER — Inpatient Hospital Stay (HOSPITAL_COMMUNITY)
Admission: EM | Admit: 2015-12-26 | Discharge: 2016-01-03 | DRG: 312 | Disposition: A | Discharge status: Skilled Nursing Facility | Payer: Medicare Other | Attending: Family Medicine | Admitting: Family Medicine

## 2015-12-26 DIAGNOSIS — H540 Blindness, both eyes: Secondary | ICD-10-CM | POA: Diagnosis present

## 2015-12-26 DIAGNOSIS — F419 Anxiety disorder, unspecified: Secondary | ICD-10-CM | POA: Diagnosis present

## 2015-12-26 DIAGNOSIS — I5042 Chronic combined systolic (congestive) and diastolic (congestive) heart failure: Secondary | ICD-10-CM | POA: Diagnosis not present

## 2015-12-26 DIAGNOSIS — I951 Orthostatic hypotension: Principal | ICD-10-CM | POA: Diagnosis present

## 2015-12-26 DIAGNOSIS — E785 Hyperlipidemia, unspecified: Secondary | ICD-10-CM | POA: Diagnosis not present

## 2015-12-26 DIAGNOSIS — T447X6A Underdosing of beta-adrenoreceptor antagonists, initial encounter: Secondary | ICD-10-CM | POA: Diagnosis present

## 2015-12-26 DIAGNOSIS — N2581 Secondary hyperparathyroidism of renal origin: Secondary | ICD-10-CM | POA: Diagnosis present

## 2015-12-26 DIAGNOSIS — Z7982 Long term (current) use of aspirin: Secondary | ICD-10-CM

## 2015-12-26 DIAGNOSIS — R531 Weakness: Secondary | ICD-10-CM | POA: Diagnosis not present

## 2015-12-26 DIAGNOSIS — I130 Hypertensive heart and chronic kidney disease with heart failure and stage 1 through stage 4 chronic kidney disease, or unspecified chronic kidney disease: Secondary | ICD-10-CM | POA: Diagnosis present

## 2015-12-26 DIAGNOSIS — F17210 Nicotine dependence, cigarettes, uncomplicated: Secondary | ICD-10-CM | POA: Diagnosis present

## 2015-12-26 DIAGNOSIS — Z6820 Body mass index (BMI) 20.0-20.9, adult: Secondary | ICD-10-CM

## 2015-12-26 DIAGNOSIS — Z79899 Other long term (current) drug therapy: Secondary | ICD-10-CM | POA: Diagnosis not present

## 2015-12-26 DIAGNOSIS — M6281 Muscle weakness (generalized): Secondary | ICD-10-CM | POA: Diagnosis not present

## 2015-12-26 DIAGNOSIS — I428 Other cardiomyopathies: Secondary | ICD-10-CM | POA: Diagnosis present

## 2015-12-26 DIAGNOSIS — T465X6A Underdosing of other antihypertensive drugs, initial encounter: Secondary | ICD-10-CM | POA: Diagnosis present

## 2015-12-26 DIAGNOSIS — R404 Transient alteration of awareness: Secondary | ICD-10-CM | POA: Diagnosis not present

## 2015-12-26 DIAGNOSIS — I2510 Atherosclerotic heart disease of native coronary artery without angina pectoris: Secondary | ICD-10-CM | POA: Diagnosis not present

## 2015-12-26 DIAGNOSIS — R05 Cough: Secondary | ICD-10-CM | POA: Diagnosis not present

## 2015-12-26 DIAGNOSIS — Z91128 Patient's intentional underdosing of medication regimen for other reason: Secondary | ICD-10-CM

## 2015-12-26 DIAGNOSIS — I252 Old myocardial infarction: Secondary | ICD-10-CM

## 2015-12-26 DIAGNOSIS — Z992 Dependence on renal dialysis: Secondary | ICD-10-CM

## 2015-12-26 DIAGNOSIS — D631 Anemia in chronic kidney disease: Secondary | ICD-10-CM | POA: Diagnosis present

## 2015-12-26 DIAGNOSIS — R079 Chest pain, unspecified: Secondary | ICD-10-CM

## 2015-12-26 DIAGNOSIS — Z7902 Long term (current) use of antithrombotics/antiplatelets: Secondary | ICD-10-CM | POA: Diagnosis not present

## 2015-12-26 DIAGNOSIS — E43 Unspecified severe protein-calorie malnutrition: Secondary | ICD-10-CM | POA: Diagnosis present

## 2015-12-26 DIAGNOSIS — N186 End stage renal disease: Secondary | ICD-10-CM

## 2015-12-26 DIAGNOSIS — R42 Dizziness and giddiness: Secondary | ICD-10-CM | POA: Diagnosis not present

## 2015-12-26 DIAGNOSIS — R0602 Shortness of breath: Secondary | ICD-10-CM | POA: Diagnosis not present

## 2015-12-26 DIAGNOSIS — I361 Nonrheumatic tricuspid (valve) insufficiency: Secondary | ICD-10-CM | POA: Diagnosis not present

## 2015-12-26 DIAGNOSIS — T50901A Poisoning by unspecified drugs, medicaments and biological substances, accidental (unintentional), initial encounter: Secondary | ICD-10-CM | POA: Diagnosis not present

## 2015-12-26 DIAGNOSIS — G9389 Other specified disorders of brain: Secondary | ICD-10-CM | POA: Diagnosis not present

## 2015-12-26 DIAGNOSIS — Z8673 Personal history of transient ischemic attack (TIA), and cerebral infarction without residual deficits: Secondary | ICD-10-CM | POA: Diagnosis not present

## 2015-12-26 DIAGNOSIS — Z833 Family history of diabetes mellitus: Secondary | ICD-10-CM | POA: Diagnosis not present

## 2015-12-26 DIAGNOSIS — I5043 Acute on chronic combined systolic (congestive) and diastolic (congestive) heart failure: Secondary | ICD-10-CM | POA: Diagnosis present

## 2015-12-26 DIAGNOSIS — R059 Cough, unspecified: Secondary | ICD-10-CM | POA: Insufficient documentation

## 2015-12-26 DIAGNOSIS — I120 Hypertensive chronic kidney disease with stage 5 chronic kidney disease or end stage renal disease: Secondary | ICD-10-CM | POA: Diagnosis not present

## 2015-12-26 DIAGNOSIS — J9811 Atelectasis: Secondary | ICD-10-CM | POA: Diagnosis not present

## 2015-12-26 DIAGNOSIS — Z8249 Family history of ischemic heart disease and other diseases of the circulatory system: Secondary | ICD-10-CM

## 2015-12-26 LAB — INFLUENZA PANEL BY PCR (TYPE A & B)
H1N1 flu by pcr: NOT DETECTED
Influenza A By PCR: NEGATIVE
Influenza B By PCR: NEGATIVE

## 2015-12-26 LAB — BASIC METABOLIC PANEL
ANION GAP: 15 (ref 5–15)
BUN: 34 mg/dL — ABNORMAL HIGH (ref 6–20)
CALCIUM: 9.6 mg/dL (ref 8.9–10.3)
CO2: 25 mmol/L (ref 22–32)
Chloride: 100 mmol/L — ABNORMAL LOW (ref 101–111)
Creatinine, Ser: 9.33 mg/dL — ABNORMAL HIGH (ref 0.61–1.24)
GFR, EST AFRICAN AMERICAN: 6 mL/min — AB (ref >60)
GFR, EST NON AFRICAN AMERICAN: 5 mL/min — AB (ref >60)
Glucose, Bld: 148 mg/dL — ABNORMAL HIGH (ref 65–99)
Potassium: 4.5 mmol/L (ref 3.5–5.1)
Sodium: 140 mmol/L (ref 135–145)

## 2015-12-26 LAB — URINE MICROSCOPIC-ADD ON
BACTERIA UA: NONE SEEN
RBC / HPF: NONE SEEN RBC/hpf (ref 0–5)

## 2015-12-26 LAB — URINALYSIS, ROUTINE W REFLEX MICROSCOPIC
BILIRUBIN URINE: NEGATIVE
Glucose, UA: NEGATIVE mg/dL
Hgb urine dipstick: NEGATIVE
KETONES UR: NEGATIVE mg/dL
Leukocytes, UA: NEGATIVE
NITRITE: NEGATIVE
Protein, ur: 100 mg/dL — AB
Specific Gravity, Urine: 1.012 (ref 1.005–1.030)
pH: 8.5 — ABNORMAL HIGH (ref 5.0–8.0)

## 2015-12-26 LAB — I-STAT TROPONIN, ED
TROPONIN I, POC: 0.04 ng/mL (ref 0.00–0.08)
TROPONIN I, POC: 0.04 ng/mL (ref 0.00–0.08)

## 2015-12-26 LAB — CBC
HCT: 38.7 % — ABNORMAL LOW (ref 39.0–52.0)
HEMOGLOBIN: 12.8 g/dL — AB (ref 13.0–17.0)
MCH: 29.8 pg (ref 26.0–34.0)
MCHC: 33.1 g/dL (ref 30.0–36.0)
MCV: 90.2 fL (ref 78.0–100.0)
Platelets: 168 10*3/uL (ref 150–400)
RBC: 4.29 MIL/uL (ref 4.22–5.81)
RDW: 17.4 % — ABNORMAL HIGH (ref 11.5–15.5)
WBC: 4.5 10*3/uL (ref 4.0–10.5)

## 2015-12-26 LAB — AMMONIA: Ammonia: 25 umol/L (ref 9–35)

## 2015-12-26 LAB — RAPID URINE DRUG SCREEN, HOSP PERFORMED
Amphetamines: NOT DETECTED
BARBITURATES: NOT DETECTED
Benzodiazepines: NOT DETECTED
Cocaine: NOT DETECTED
Opiates: NOT DETECTED
Tetrahydrocannabinol: NOT DETECTED

## 2015-12-26 LAB — LACTIC ACID, PLASMA
LACTIC ACID, VENOUS: 1.2 mmol/L (ref 0.5–2.0)
LACTIC ACID, VENOUS: 1.1 mmol/L (ref 0.5–2.0)

## 2015-12-26 LAB — I-STAT CG4 LACTIC ACID, ED: LACTIC ACID, VENOUS: 1.17 mmol/L (ref 0.5–2.0)

## 2015-12-26 LAB — TSH: TSH: 1.678 u[IU]/mL (ref 0.350–4.500)

## 2015-12-26 LAB — T4, FREE: FREE T4: 0.89 ng/dL (ref 0.61–1.12)

## 2015-12-26 LAB — ETHANOL

## 2015-12-26 MED ORDER — RENA-VITE PO TABS
1.0000 | ORAL_TABLET | Freq: Every day | ORAL | Status: DC
  Administered 2015-12-26 – 2016-01-02 (×8): 1 via ORAL
  Filled 2015-12-26 (×8): qty 1

## 2015-12-26 MED ORDER — ASPIRIN 81 MG PO CHEW
81.0000 mg | CHEWABLE_TABLET | Freq: Every day | ORAL | Status: DC
  Administered 2015-12-27 – 2016-01-03 (×7): 81 mg via ORAL
  Filled 2015-12-26 (×7): qty 1

## 2015-12-26 MED ORDER — HYDRALAZINE HCL 50 MG PO TABS
50.0000 mg | ORAL_TABLET | Freq: Three times a day (TID) | ORAL | Status: DC
  Administered 2015-12-26 – 2015-12-27 (×3): 50 mg via ORAL
  Filled 2015-12-26 (×3): qty 1

## 2015-12-26 MED ORDER — FINASTERIDE 5 MG PO TABS
5.0000 mg | ORAL_TABLET | Freq: Every day | ORAL | Status: DC
  Administered 2015-12-27 – 2015-12-30 (×4): 5 mg via ORAL
  Filled 2015-12-26 (×4): qty 1

## 2015-12-26 MED ORDER — PANTOPRAZOLE SODIUM 40 MG PO TBEC
40.0000 mg | DELAYED_RELEASE_TABLET | Freq: Every day | ORAL | Status: DC
Start: 2015-12-26 — End: 2016-01-03
  Administered 2015-12-26 – 2016-01-03 (×8): 40 mg via ORAL
  Filled 2015-12-26 (×8): qty 1

## 2015-12-26 MED ORDER — ONDANSETRON HCL 4 MG PO TABS: 4.0000 mg | ORAL_TABLET | Freq: Four times a day (QID) | ORAL | Status: DC | PRN

## 2015-12-26 MED ORDER — GABAPENTIN 100 MG PO CAPS
100.0000 mg | ORAL_CAPSULE | Freq: Every day | ORAL | Status: DC
  Administered 2015-12-26 – 2016-01-02 (×8): 100 mg via ORAL
  Filled 2015-12-26 (×8): qty 1

## 2015-12-26 MED ORDER — ONDANSETRON HCL 4 MG/2ML IJ SOLN
4.0000 mg | Freq: Four times a day (QID) | INTRAMUSCULAR | Status: DC | PRN
  Administered 2015-12-27: 4 mg via INTRAVENOUS

## 2015-12-26 MED ORDER — TAMSULOSIN HCL 0.4 MG PO CAPS
0.4000 mg | ORAL_CAPSULE | Freq: Two times a day (BID) | ORAL | Status: DC
  Administered 2015-12-26 – 2015-12-30 (×8): 0.4 mg via ORAL
  Filled 2015-12-26 (×8): qty 1

## 2015-12-26 MED ORDER — CLOPIDOGREL BISULFATE 75 MG PO TABS
75.0000 mg | ORAL_TABLET | Freq: Every day | ORAL | Status: DC
  Administered 2015-12-27 – 2016-01-03 (×7): 75 mg via ORAL
  Filled 2015-12-26 (×7): qty 1

## 2015-12-26 MED ORDER — DIALYVITE PO TABS: 1.0000 | ORAL_TABLET | Freq: Every day | ORAL | Status: DC

## 2015-12-26 MED ORDER — CLONIDINE HCL 0.1 MG PO TABS
0.1000 mg | ORAL_TABLET | Freq: Two times a day (BID) | ORAL | Status: DC
  Administered 2015-12-26 – 2015-12-27 (×3): 0.1 mg via ORAL
  Filled 2015-12-26 (×3): qty 1

## 2015-12-26 MED ORDER — CALCIUM ACETATE (PHOS BINDER) 667 MG PO CAPS
667.0000 mg | ORAL_CAPSULE | Freq: Three times a day (TID) | ORAL | Status: DC
  Administered 2015-12-27 – 2015-12-31 (×12): 667 mg via ORAL
  Filled 2015-12-26 (×12): qty 1

## 2015-12-26 MED ORDER — ATORVASTATIN CALCIUM 40 MG PO TABS
40.0000 mg | ORAL_TABLET | Freq: Every day | ORAL | Status: DC
Start: 2015-12-27 — End: 2016-01-03
  Administered 2015-12-27 – 2016-01-02 (×7): 40 mg via ORAL
  Filled 2015-12-26 (×7): qty 1

## 2015-12-26 MED ORDER — CARVEDILOL 12.5 MG PO TABS
12.5000 mg | ORAL_TABLET | Freq: Two times a day (BID) | ORAL | Status: DC
  Administered 2015-12-27 (×2): 12.5 mg via ORAL
  Filled 2015-12-26 (×2): qty 1

## 2015-12-26 MED ORDER — ACETAMINOPHEN 325 MG PO TABS: 650.0000 mg | ORAL_TABLET | Freq: Four times a day (QID) | ORAL | Status: DC | PRN

## 2015-12-26 MED ORDER — ACETAMINOPHEN 650 MG RE SUPP: 650.0000 mg | Freq: Four times a day (QID) | RECTAL | Status: DC | PRN

## 2015-12-26 MED ORDER — HEPARIN SODIUM (PORCINE) 5000 UNIT/ML IJ SOLN
5000.0000 [IU] | Freq: Three times a day (TID) | INTRAMUSCULAR | Status: DC
  Administered 2015-12-26 – 2016-01-03 (×23): 5000 [IU] via SUBCUTANEOUS
  Filled 2015-12-26 (×23): qty 1

## 2015-12-26 NOTE — Consult Note (Signed)
Reason for Consult:ESRD Referring Physician: Dr. Domenic Polite  Chief Complaint: Weakness  Assessment/Plan: 1 Weakness - Unclear etiology but hasn't been feeling well since the end of dialysis on Monday. I wonder if he had a hypotensive episode although his blood pressures here in the ED have been stable. Only other symptom by ROS is that he has a productive cough with nothing impressive on CXR and no rigors or documented fevers + headaches intermittently. 2 ESRD: [facility] HD with last HD on Mon. Will plan on HD in the AM; no acute indications tonight. His EDW is 62kg and he doesn't appear to be much above his EDW. 3 Hypertension: Stable and actuallly well controlled; no hypotensive episodes noted in the ED. 4. Anemia of ESRD: Stable and at goal. 5. Metabolic Bone Disease: Will check a phos only and  manage renal osteodystrophy (PTH) as an outpatient.   Dialyzes at [facility] Dr. Marval Regal EDW 62kg HD [place] 2/2.25 Heparin 1900 units Access Lt BBF  HPI: Trevor [name] is a 70 y.o. male ESRD dialyzing at [facility] with Dr. Marval Regal with a EDW of 62kg and presenting with generalized weakness which started after dialysis on Monday but worsened today. He also states that he has had subjective fevers and a productive cough. He is blind in the right eye and sees some shapes through the left eye; therefore was not able to see what he was coughing up. He also has had intermittent headaches which he states is new. He missed dialysis today because he was feeling weak; he is weaker on the right side but is able to ambulate with a walker + uses a wheelchair as well. He denies any nausea, vomiting, diarrhea, abdominal pain, sore throat or rhinorrhea. He also denies chest pain or palpitations.    Past Medical History  Diagnosis Date  . HTN (hypertension)   . Anxiety   . Cardiomyopathy   . Renal insufficiency   . Myocardial infarction ([place])   . CVA (cerebral infarction)   . Chest pain   .  Shortness of breath   . Headache(784.0)   . Arthritis   . Blindness      Allergies: No Known Allergies  Past Surgical History  Procedure Laterality Date  . Tee without cardioversion  10/09/2011    Procedure: TRANSESOPHAGEAL ECHOCARDIOGRAM (TEE);  Surgeon: Lelon Perla, MD;  Location: [facility] ENDOSCOPY;  Service: Cardiovascular;  Laterality: N/A;  . Right hand    . Insertion of dialysis catheter  09/21/2012    Procedure: INSERTION OF DIALYSIS CATHETER;  Surgeon: Angelia Mould, MD;  Location: [place];  Service: Vascular;  Laterality: N/A;  Right Internal Jugular Placement  . Left heart catheterization with coronary angiogram N/A 09/28/2012    Procedure: LEFT HEART CATHETERIZATION WITH CORONARY ANGIOGRAM;  Surgeon: Sherren Mocha, MD;  Location: [facility] CATH LAB;  Service: Cardiovascular;  Laterality: N/A;  . Left heart catheterization with coronary angiogram N/A 07/07/2014    Procedure: LEFT HEART CATHETERIZATION WITH CORONARY ANGIOGRAM;  Surgeon: Leonie Man, MD;  Location: [facility] CATH LAB;  Service: Cardiovascular;  Laterality: N/A;    Family History  Problem Relation Age of Onset  . Heart attack Mother     MI in her 106s  . Diabetes Mother   . Anesthesia problems Neg Hx   . Hypotension Neg Hx   . Malignant hyperthermia Neg Hx   . Pseudochol deficiency Neg Hx   . Alcohol abuse Father     Social History:  reports that  he has been smoking Cigarettes.  He has a 25 pack-year smoking history. He has never used smokeless tobacco. He reports that he does not drink alcohol or use illicit drugs.  ROS Pertinent items are noted in HPI.  Blood pressure 142/60, pulse 78, temperature 97.3 F (36.3 C), temperature source Rectal, resp. rate 25, SpO2 96 %. General appearance: alert and appears older than stated age Head: Normocephalic, without obvious abnormality, atraumatic Neck: no adenopathy, no carotid bruit, no JVD, supple, symmetrical, trachea midline and thyroid not enlarged, symmetric,  no tenderness/mass/nodules Back: symmetric, no curvature. ROM normal. No CVA tenderness. Resp: clear to auscultation bilaterally Chest wall: no tenderness Cardio: regular rate and rhythm, S1, S2 normal, no murmur, click, rub or gallop GI: soft, non-tender; bowel sounds normal; no masses,  no organomegaly Extremities: extremities normal, atraumatic, no cyanosis or edema Pulses: 2+ and symmetric Skin: Skin color, texture, turgor normal. No rashes or lesions Lymph nodes: Cervical, supraclavicular, and axillary nodes normal. Neurologic: Grossly normal, blind in the right eye, was able to make out 2 fingers through the left eye Access: left BBF with strong thrill  Results for orders placed or performed during the hospital encounter of 12/26/15 (from the past 48 hour(s))  Basic metabolic panel     Status: Abnormal   Collection Time: 12/26/15 11:35 AM  Result Value Ref Range   Sodium 140 135 - 145 mmol/L   Potassium 4.5 3.5 - 5.1 mmol/L   Chloride 100 (L) 101 - 111 mmol/L   CO2 25 22 - 32 mmol/L   Glucose, Bld 148 (H) 65 - 99 mg/dL   BUN 34 (H) 6 - 20 mg/dL   Creatinine, Ser 9.33 (H) 0.61 - 1.24 mg/dL   Calcium 9.6 8.9 - 10.3 mg/dL   GFR calc non Af Amer 5 (L) >60 mL/min   GFR calc Af Amer 6 (L) >60 mL/min    Comment: (NOTE) The eGFR has been calculated using the CKD EPI equation. This calculation has not been validated in all clinical situations. eGFR's persistently <60 mL/min signify possible Chronic Kidney Disease.    Anion gap 15 5 - 15  CBC     Status: Abnormal   Collection Time: 12/26/15 11:35 AM  Result Value Ref Range   WBC 4.5 4.0 - 10.5 K/uL   RBC 4.29 4.22 - 5.81 MIL/uL   Hemoglobin 12.8 (L) 13.0 - 17.0 g/dL   HCT 38.7 (L) 39.0 - 52.0 %   MCV 90.2 78.0 - 100.0 fL   MCH 29.8 26.0 - 34.0 pg   MCHC 33.1 30.0 - 36.0 g/dL   RDW 17.4 (H) 11.5 - 15.5 %   Platelets 168 150 - 400 K/uL  I-stat troponin, ED (not at [facility])     Status: None   Collection Time: 12/26/15 11:44  AM  Result Value Ref Range   Troponin i, poc 0.04 0.00 - 0.08 ng/mL   Comment 3            Comment: Due to the release kinetics of cTnI, a negative result within the first hours of the onset of symptoms does not rule out myocardial infarction with certainty. If myocardial infarction is still suspected, repeat the test at appropriate intervals.   I-Stat CG4 Lactic Acid, ED     Status: None   Collection Time: 12/26/15 12:31 PM  Result Value Ref Range   Lactic Acid, Venous 1.17 0.5 - 2.0 mmol/L  TSH     Status: None   Collection Time:  12/26/15  4:01 PM  Result Value Ref Range   TSH 1.678 0.350 - 4.500 uIU/mL  Ammonia     Status: None   Collection Time: 12/26/15  4:01 PM  Result Value Ref Range   Ammonia 25 9 - 35 umol/L  T4, free     Status: None   Collection Time: 12/26/15  4:02 PM  Result Value Ref Range   Free T4 0.89 0.61 - 1.12 ng/dL  Ethanol     Status: None   Collection Time: 12/26/15  4:02 PM  Result Value Ref Range   Alcohol, Ethyl (B) <5 <5 mg/dL    Comment:        LOWEST DETECTABLE LIMIT FOR SERUM ALCOHOL IS 5 mg/dL FOR MEDICAL PURPOSES ONLY   I-Stat Troponin, ED (not at [facility])     Status: None   Collection Time: 12/26/15  4:02 PM  Result Value Ref Range   Troponin i, poc 0.04 0.00 - 0.08 ng/mL   Comment 3            Comment: Due to the release kinetics of cTnI, a negative result within the first hours of the onset of symptoms does not rule out myocardial infarction with certainty. If myocardial infarction is still suspected, repeat the test at appropriate intervals.     Dg Chest 2 View  12/26/2015  CLINICAL DATA:  Generalized weakness EXAM: CHEST  2 VIEW COMPARISON:  08/12/2015 FINDINGS: Cardiomegaly. Normal vascularity. New oval density over the right upper lung zone may reflect an overlying object. Minimal basilar hypoaeration change. IMPRESSION: Right upper lobe nodular density may reflect an overlying object. Repeat after removal of overlying objects is  recommended. Cardiomegaly without edema. Electronically Signed   By: Marybelle Killings M.D.   On: 12/26/2015 14:04   Ct Head Wo Contrast  12/26/2015  CLINICAL DATA:  Generalized weakness EXAM: CT HEAD WITHOUT CONTRAST TECHNIQUE: Contiguous axial images were obtained from the base of the skull through the vertex without intravenous contrast. COMPARISON:  04/04/2015.  04/03/2015. FINDINGS: Encephalomalacia in the left frontal and right occipital lobes is stable. Chronic ischemic changes in the periventricular white matter. No mass effect, midline shift, or acute hemorrhage. Mastoid air cells are clear. Visualized paranasal sinuses are clear. IMPRESSION: Chronic ischemic changes.  No acute intracranial pathology. Electronically Signed   By: Marybelle Killings M.D.   On: 12/26/2015 13:28     Dwana Melena, MD 12/26/2015, 6:35 PM

## 2015-12-26 NOTE — ED Notes (Signed)
Attempted report 

## 2015-12-26 NOTE — H&P (Addendum)
[facility] History and Physical  Trevor [name] [id number] DOB: Apr 25, 1946 DOA: 12/26/2015  Referring physician: EDP PCP: [name], MD   Chief Complaint: Weakness  HPI: Trevor [name] is a 70 y.o. male with of ESRD on HD MWF, H/o CVA,  H/o Cardiomyopathy with improved EF to 55%, anxiety, hypertension presents to the ER with the above complaints. Patient reports that his last dialysis was on Monday. He had stopped all his home medications 1 month ago since they were not organized in a pillbox, He just had his home health nurse, sort all of his medicines into a pillbox yesterday and started taking them after a gap of one month. This morning woke up with profound generalized weakness . He also has a mild cough, sore throat and headache . He denies any fevers or chills , upon arrival to the emergency room he was noted to be hypothermic, rest of the workup was pretty unremarkable, he makes a small amount of urine and UA is pending at this time    Review of Systems:  Constitutional:  No weight loss, night sweats, Fevers, chills, fatigue.  HEENT:  No headaches, Difficulty swallowing,Tooth/dental problems,Sore throat,  No sneezing, itching, ear ache, nasal congestion, post nasal drip,  Cardio-vascular:  No chest pain, Orthopnea, PND, swelling in lower extremities, anasarca, dizziness, palpitations  GI:  No heartburn, indigestion, abdominal pain, nausea, vomiting, diarrhea, change in bowel habits, loss of appetite  Resp:  No shortness of breath with exertion or at rest. No excess mucus, no productive cough, No non-productive cough, No coughing up of blood.No change in color of mucus.No wheezing.No chest wall deformity  Skin:  no rash or lesions.  GU:  no dysuria, change in color of urine, no urgency or frequency. No flank pain.  Musculoskeletal:  No joint pain or swelling. No decreased range of motion. No back pain.  Psych:  No change in mood or affect. No  depression or anxiety. No memory loss.   Past Medical History  Diagnosis Date  . HTN (hypertension)   . Anxiety   . Cardiomyopathy   . Renal insufficiency   . Myocardial infarction ([place])   . CVA (cerebral infarction)   . Chest pain   . Shortness of breath   . Headache(784.0)   . Arthritis   . Blindness    Past Surgical History  Procedure Laterality Date  . Tee without cardioversion  10/09/2011    Procedure: TRANSESOPHAGEAL ECHOCARDIOGRAM (TEE);  Surgeon: Lelon Perla, MD;  Location: [facility] ENDOSCOPY;  Service: Cardiovascular;  Laterality: N/A;  . Right hand    . Insertion of dialysis catheter  09/21/2012    Procedure: INSERTION OF DIALYSIS CATHETER;  Surgeon: Angelia Mould, MD;  Location: [place];  Service: Vascular;  Laterality: N/A;  Right Internal Jugular Placement  . Left heart catheterization with coronary angiogram N/A 09/28/2012    Procedure: LEFT HEART CATHETERIZATION WITH CORONARY ANGIOGRAM;  Surgeon: Sherren Mocha, MD;  Location: [facility] CATH LAB;  Service: Cardiovascular;  Laterality: N/A;  . Left heart catheterization with coronary angiogram N/A 07/07/2014    Procedure: LEFT HEART CATHETERIZATION WITH CORONARY ANGIOGRAM;  Surgeon: Leonie Man, MD;  Location: [facility] CATH LAB;  Service: Cardiovascular;  Laterality: N/A;   Social History:  reports that he has been smoking Cigarettes.  He has a 25 pack-year smoking history. He has never used smokeless tobacco. He reports that he does not drink alcohol or use illicit drugs. Lives alone, ambulates with a walker or  wheelchair  No Known Allergies  Family History  Problem Relation Age of Onset  . Heart attack Mother     MI in her 72s  . Diabetes Mother   . Anesthesia problems Neg Hx   . Hypotension Neg Hx   . Malignant hyperthermia Neg Hx   . Pseudochol deficiency Neg Hx   . Alcohol abuse Father     Prior to Admission medications   Medication Sig Start Date End Date Taking? Authorizing Provider  aspirin 81 MG chewable  tablet Chew 1 tablet (81 mg total) by mouth daily. 08/18/15   Geradine Girt, DO  atorvastatin (LIPITOR) 40 MG tablet Take 1 tablet (40 mg total) by mouth daily at 6 PM. 08/18/15   Geradine Girt, DO  B Complex-C-Folic Acid (DIALYVITE TABLET) TABS Take 1 tablet by mouth daily. 09/28/12   Sorin June Leap, MD  calcium acetate (PHOSLO) 667 MG capsule Take 667 mg by mouth 3 (three) times daily with meals.    Historical Provider, MD  carvedilol (COREG) 12.5 MG tablet Take 1 tablet (12.5 mg total) by mouth 2 (two) times daily with a meal. 08/18/15   Geradine Girt, DO  cloNIDine (CATAPRES) 0.1 MG tablet Take 0.1 mg by mouth 2 (two) times daily. 03/28/15   Historical Provider, MD  clopidogrel (PLAVIX) 75 MG tablet Take 1 tablet (75 mg total) by mouth daily. 04/05/15   Barton Dubois, MD  finasteride (PROSCAR) 5 MG tablet Take 5 mg by mouth daily. 03/28/15   Historical Provider, MD  gabapentin (NEURONTIN) 100 MG capsule Take 1 capsule (100 mg total) by mouth 3 (three) times daily. 04/05/15   Barton Dubois, MD  hydrALAZINE (APRESOLINE) 50 MG tablet Take 1 tablet (50 mg total) by mouth 3 (three) times daily. 04/05/15   Barton Dubois, MD  multivitamin (RENA-VIT) TABS tablet Take 1 tablet by mouth daily. 03/28/15   Historical Provider, MD  nicotine (NICODERM CQ - DOSED IN MG/24 HOURS) 21 mg/24hr patch Place 1 patch (21 mg total) onto the skin daily. Patient not taking: Reported on 07/30/2015 04/05/15   Barton Dubois, MD  NIFEdipine (PROCARDIA XL/ADALAT-CC) 90 MG 24 hr tablet Take 1 tablet (90 mg total) by mouth daily. 04/05/15   Barton Dubois, MD  Nutritional Supplements (FEEDING SUPPLEMENT, NEPRO CARB STEADY,) LIQD Take 237 mLs by mouth 2 (two) times daily between meals. Patient not taking: Reported on 07/30/2015 04/05/15   Barton Dubois, MD  omega-3 acid ethyl esters (LOVAZA) 1 G capsule Take 1 capsule (1 g total) by mouth 2 (two) times daily. 04/05/15   Barton Dubois, MD  pantoprazole (PROTONIX) 40 MG tablet Take 1 tablet (40 mg  total) by mouth daily. 08/18/15   Geradine Girt, DO  tamsulosin (FLOMAX) 0.4 MG CAPS capsule Take 0.4 mg by mouth 2 (two) times daily. 03/28/15   Historical Provider, MD   Physical Exam: Filed Vitals:   12/26/15 1615 12/26/15 1630 12/26/15 1645 12/26/15 1702  BP: 133/107 145/67 143/68   Pulse: 80 75 72   Temp:    97.3 F (36.3 C)  TempSrc:    Rectal  Resp: 18 19 17    SpO2: 97% 98% 93%     Wt Readings from Last 3 Encounters:  08/18/15 57.652 kg (127 lb 1.6 oz)  08/07/15 60.328 kg (133 lb)  04/05/15 62.6 kg (138 lb 0.1 oz)    General:  Appears calm and comfortable, no distress, AAO 3  Eyes: PERRL, normal lids, irises & conjunctiva ENT: grossly normal  hearing, lips & tongue Neck: slightly enlarged right submandibular lymph node Cardiovascular: RRR, no m/r/g. No LE edema. Telemetry: SR, no arrhythmias  Respiratory: CTA bilaterally, no w/r/r. Normal respiratory effort. Abdomen: soft, ntnd Skin: no rash or induration seen on limited exam Musculoskeletal: grossly normal tone BUE/BLE Psychiatric: grossly normal mood and affect, speech fluent and appropriate Neurologic: grossly non-focal.          Labs on Admission:  Basic Metabolic Panel:  Recent Labs Lab 12/26/15 1135  NA 140  K 4.5  CL 100*  CO2 25  GLUCOSE 148*  BUN 34*  CREATININE 9.33*  CALCIUM 9.6   Liver Function Tests: No results for input(s): AST, ALT, ALKPHOS, BILITOT, PROT, ALBUMIN in the last 168 hours. No results for input(s): LIPASE, AMYLASE in the last 168 hours.  Recent Labs Lab 12/26/15 1601  AMMONIA 25   CBC:  Recent Labs Lab 12/26/15 1135  WBC 4.5  HGB 12.8*  HCT 38.7*  MCV 90.2  PLT 168   Cardiac Enzymes: No results for input(s): CKTOTAL, CKMB, CKMBINDEX, TROPONINI in the last 168 hours.  BNP (last 3 results)  Recent Labs  01/08/15 1929  BNP >4500.0*    ProBNP (last 3 results) No results for input(s): PROBNP in the last 8760 hours.  CBG: No results for input(s): GLUCAP  in the last 168 hours.  Radiological Exams on Admission: Dg Chest 2 View  12/26/2015  CLINICAL DATA:  Generalized weakness EXAM: CHEST  2 VIEW COMPARISON:  08/12/2015 FINDINGS: Cardiomegaly. Normal vascularity. New oval density over the right upper lung zone may reflect an overlying object. Minimal basilar hypoaeration change. IMPRESSION: Right upper lobe nodular density may reflect an overlying object. Repeat after removal of overlying objects is recommended. Cardiomegaly without edema. Electronically Signed   By: Marybelle Killings M.D.   On: 12/26/2015 14:04   Ct Head Wo Contrast  12/26/2015  CLINICAL DATA:  Generalized weakness EXAM: CT HEAD WITHOUT CONTRAST TECHNIQUE: Contiguous axial images were obtained from the base of the skull through the vertex without intravenous contrast. COMPARISON:  04/04/2015.  04/03/2015. FINDINGS: Encephalomalacia in the left frontal and right occipital lobes is stable. Chronic ischemic changes in the periventricular white matter. No mass effect, midline shift, or acute hemorrhage. Mastoid air cells are clear. Visualized paranasal sinuses are clear. IMPRESSION: Chronic ischemic changes.  No acute intracranial pathology. Electronically Signed   By: Marybelle Killings M.D.   On: 12/26/2015 13:28    EKG: Independently reviewed. LVH, no acute ST T wave changes  Assessment/Plan   Generalized weakness -Etiology unclear at this time, has some symptoms of a URI, will check influenza PCR -Check lactic acid, ammonia, UA, TSh T4 -It is possible that resuming all his home medicines yesterday after a long gap could be potentially contributing, cut down gabapentin dose - no focal neurological symptoms, no meningeal signs  -Pt /Ot eval    Nonischemic cardiomyopathy -Stable, EF improved from last echo to 55% -Continue Coreg  ESRD on HD Monday Wednesday Friday -Notified Dr.Lin of admission, no urgent need for HD at this time    History of stroke - exam is nonfocal, CT unremarkable,  continue Plavix     Chronic combined systolic and diastolic CHF (congestive heart failure) (HCC) -Volume management HD, continue Coreg     Protein-calorie malnutrition, severe ([place]) -RD consult  DVT proph: Hep SQ  Code Status: Full Code DVT Prophylaxis: Hep SQ Family Communication: none at bedside Disposition Plan: inpatient  Time spent: 56min  Junette Bernat [facility] Pager [phone]

## 2015-12-26 NOTE — ED Notes (Signed)
Pt given meal

## 2015-12-26 NOTE — ED Notes (Signed)
Per [facility], pt diaylsis patient, has been off all mediations x3 weeks, just started them yesterday. Was able to do dialysis Monday, but too weak to go today. Pt is AAOX4, in NAD, no pain, no n/v/d, just generally weak. No focal deficits.

## 2015-12-26 NOTE — ED Provider Notes (Signed)
CSN: [id number]     Arrival date & time 12/26/15  1105 History   First MD Initiated Contact with Patient 12/26/15 1120     Chief Complaint  Patient presents with  . Weakness     (Consider location/radiation/quality/duration/timing/severity/associated sxs/prior Treatment) HPI Comments: Woke up this AM and felt fatigued, weak all over No n/v/diarrhea/melena No CP/pressure/dyspnea Not sure if fever Still makes urine, no pain   Patient is a 70 y.o. male presenting with weakness.  Weakness Associated symptoms include headaches. Pertinent negatives include no chest pain, no abdominal pain and no shortness of breath.    Past Medical History  Diagnosis Date  . HTN (hypertension)   . Anxiety   . Cardiomyopathy   . Renal insufficiency   . Myocardial infarction ([place])   . CVA (cerebral infarction)   . Chest pain   . Shortness of breath   . Headache(784.0)   . Arthritis   . Blindness    Past Surgical History  Procedure Laterality Date  . Tee without cardioversion  10/09/2011    Procedure: TRANSESOPHAGEAL ECHOCARDIOGRAM (TEE);  Surgeon: Lelon Perla, MD;  Location: [facility] ENDOSCOPY;  Service: Cardiovascular;  Laterality: N/A;  . Right hand    . Insertion of dialysis catheter  09/21/2012    Procedure: INSERTION OF DIALYSIS CATHETER;  Surgeon: Angelia Mould, MD;  Location: [place];  Service: Vascular;  Laterality: N/A;  Right Internal Jugular Placement  . Left heart catheterization with coronary angiogram N/A 09/28/2012    Procedure: LEFT HEART CATHETERIZATION WITH CORONARY ANGIOGRAM;  Surgeon: Sherren Mocha, MD;  Location: [facility] CATH LAB;  Service: Cardiovascular;  Laterality: N/A;  . Left heart catheterization with coronary angiogram N/A 07/07/2014    Procedure: LEFT HEART CATHETERIZATION WITH CORONARY ANGIOGRAM;  Surgeon: Leonie Man, MD;  Location: [facility] CATH LAB;  Service: Cardiovascular;  Laterality: N/A;   Family History  Problem Relation Age of Onset  . Heart attack Mother      MI in her 77s  . Diabetes Mother   . Anesthesia problems Neg Hx   . Hypotension Neg Hx   . Malignant hyperthermia Neg Hx   . Pseudochol deficiency Neg Hx   . Alcohol abuse Father    Social History  Substance Use Topics  . Smoking status: Current Some Day Smoker -- 0.50 packs/day for 50 years    Types: Cigarettes  . Smokeless tobacco: Never Used  . Alcohol Use: No     Comment: Occasional    Review of Systems  Constitutional: Negative for fever.  HENT: Positive for sore throat (couple days).   Eyes: Negative for visual disturbance.  Respiratory: Negative for shortness of breath.   Cardiovascular: Negative for chest pain.  Gastrointestinal: Negative for nausea, vomiting, abdominal pain, diarrhea and constipation.  Genitourinary: Negative for difficulty urinating.  Musculoskeletal: Negative for back pain and neck stiffness.  Skin: Negative for rash.  Neurological: Positive for weakness (generalized), light-headedness and headaches. Negative for dizziness, syncope, facial asymmetry, speech difficulty (too tired) and numbness.      Allergies  Review of patient's allergies indicates no known allergies.  Home Medications   Prior to Admission medications   Medication Sig Start Date End Date Taking? Authorizing Provider  aspirin 81 MG chewable tablet Chew 1 tablet (81 mg total) by mouth daily. 08/18/15   Geradine Girt, DO  atorvastatin (LIPITOR) 40 MG tablet Take 1 tablet (40 mg total) by mouth daily at 6 PM. 08/18/15   Geradine Girt, DO  B  Complex-C-Folic Acid (DIALYVITE TABLET) TABS Take 1 tablet by mouth daily. 09/28/12   Sorin June Leap, MD  calcium acetate (PHOSLO) 667 MG capsule Take 667 mg by mouth 3 (three) times daily with meals.    Historical Provider, MD  carvedilol (COREG) 12.5 MG tablet Take 1 tablet (12.5 mg total) by mouth 2 (two) times daily with a meal. 08/18/15   Geradine Girt, DO  cloNIDine (CATAPRES) 0.1 MG tablet Take 0.1 mg by mouth 2 (two) times daily.  03/28/15   Historical Provider, MD  clopidogrel (PLAVIX) 75 MG tablet Take 1 tablet (75 mg total) by mouth daily. 04/05/15   Barton Dubois, MD  finasteride (PROSCAR) 5 MG tablet Take 5 mg by mouth daily. 03/28/15   Historical Provider, MD  gabapentin (NEURONTIN) 100 MG capsule Take 1 capsule (100 mg total) by mouth 3 (three) times daily. 04/05/15   Barton Dubois, MD  hydrALAZINE (APRESOLINE) 50 MG tablet Take 1 tablet (50 mg total) by mouth 3 (three) times daily. 04/05/15   Barton Dubois, MD  multivitamin (RENA-VIT) TABS tablet Take 1 tablet by mouth daily. 03/28/15   Historical Provider, MD  nicotine (NICODERM CQ - DOSED IN MG/24 HOURS) 21 mg/24hr patch Place 1 patch (21 mg total) onto the skin daily. Patient not taking: Reported on 07/30/2015 04/05/15   Barton Dubois, MD  NIFEdipine (PROCARDIA XL/ADALAT-CC) 90 MG 24 hr tablet Take 1 tablet (90 mg total) by mouth daily. 04/05/15   Barton Dubois, MD  Nutritional Supplements (FEEDING SUPPLEMENT, NEPRO CARB STEADY,) LIQD Take 237 mLs by mouth 2 (two) times daily between meals. Patient not taking: Reported on 07/30/2015 04/05/15   Barton Dubois, MD  omega-3 acid ethyl esters (LOVAZA) 1 G capsule Take 1 capsule (1 g total) by mouth 2 (two) times daily. 04/05/15   Barton Dubois, MD  pantoprazole (PROTONIX) 40 MG tablet Take 1 tablet (40 mg total) by mouth daily. 08/18/15   Geradine Girt, DO  tamsulosin (FLOMAX) 0.4 MG CAPS capsule Take 0.4 mg by mouth 2 (two) times daily. 03/28/15   Historical Provider, MD   BP 172/83 mmHg  Pulse 84  Temp(Src) 97.3 F (36.3 C) (Rectal)  Resp 17  SpO2 98% Physical Exam  Constitutional: He is oriented to person, place, and time. He appears well-developed and well-nourished. No distress.  HENT:  Head: Normocephalic and atraumatic.  Mouth/Throat: No oropharyngeal exudate, posterior oropharyngeal edema or posterior oropharyngeal erythema.  Eyes: Conjunctivae and EOM are normal.  Neck: Normal range of motion.  Cardiovascular: Normal  rate, regular rhythm, normal heart sounds and intact distal pulses.  Exam reveals no gallop and no friction rub.   No murmur heard. Pulmonary/Chest: Effort normal and breath sounds normal. No respiratory distress. He has no wheezes. He has no rales.  Abdominal: Soft. He exhibits no distension. There is no tenderness. There is no guarding.  Musculoskeletal: He exhibits no edema.  Neurological: He is alert and oriented to person, place, and time. He has normal strength. No cranial nerve deficit or sensory deficit. Coordination normal. GCS eye subscore is 4. GCS verbal subscore is 5. GCS motor subscore is 6.  Skin: Skin is warm and dry. He is not diaphoretic.  Nursing note and vitals reviewed.   ED Course  Procedures (including critical care time) Labs Review Labs Reviewed  BASIC METABOLIC PANEL - Abnormal; Notable for the following:    Chloride 100 (*)    Glucose, Bld 148 (*)    BUN 34 (*)    Creatinine,  Ser 9.33 (*)    GFR calc non Af Amer 5 (*)    GFR calc Af Amer 6 (*)    All other components within normal limits  CBC - Abnormal; Notable for the following:    Hemoglobin 12.8 (*)    HCT 38.7 (*)    RDW 17.4 (*)    All other components within normal limits  URINALYSIS, ROUTINE W REFLEX MICROSCOPIC (NOT AT [facility]) - Abnormal; Notable for the following:    pH 8.5 (*)    Protein, ur 100 (*)    All other components within normal limits  URINE MICROSCOPIC-ADD ON - Abnormal; Notable for the following:    Squamous Epithelial / LPF 0-5 (*)    All other components within normal limits  URINE CULTURE  TSH  T4, FREE  AMMONIA  URINE RAPID DRUG SCREEN, HOSP PERFORMED  ETHANOL  LACTIC ACID, PLASMA  LACTIC ACID, PLASMA  INFLUENZA PANEL BY PCR (TYPE A & B, H1N1)  I-STAT TROPOININ, ED  I-STAT CG4 LACTIC ACID, ED  Randolm Idol, ED    Imaging Review Dg Chest 2 View  12/26/2015  CLINICAL DATA:  Generalized weakness EXAM: CHEST  2 VIEW COMPARISON:  08/12/2015 FINDINGS: Cardiomegaly.  Normal vascularity. New oval density over the right upper lung zone may reflect an overlying object. Minimal basilar hypoaeration change. IMPRESSION: Right upper lobe nodular density may reflect an overlying object. Repeat after removal of overlying objects is recommended. Cardiomegaly without edema. Electronically Signed   By: Marybelle Killings M.D.   On: 12/26/2015 14:04   Ct Head Wo Contrast  12/26/2015  CLINICAL DATA:  Generalized weakness EXAM: CT HEAD WITHOUT CONTRAST TECHNIQUE: Contiguous axial images were obtained from the base of the skull through the vertex without intravenous contrast. COMPARISON:  04/04/2015.  04/03/2015. FINDINGS: Encephalomalacia in the left frontal and right occipital lobes is stable. Chronic ischemic changes in the periventricular white matter. No mass effect, midline shift, or acute hemorrhage. Mastoid air cells are clear. Visualized paranasal sinuses are clear. IMPRESSION: Chronic ischemic changes.  No acute intracranial pathology. Electronically Signed   By: Marybelle Killings M.D.   On: 12/26/2015 13:28   I have personally reviewed and evaluated these images and lab results as part of my medical decision-making.   EKG Interpretation   Date/Time:  Wednesday December 26 2015 11:16:59 EST Ventricular Rate:  61 PR Interval:  162 QRS Duration: 119 QT Interval:  455 QTC Calculation: 458 R Axis:   19 Text Interpretation:  Sinus rhythm LVH with IVCD and secondary repol abnrm  No significant change since last tracing Confirmed by [facility] MD, Maryela Tapper  ([id number]) on 12/26/2015 11:54:10 AM      MDM   Final diagnoses:  Weakness generalized   70 year old male with a history of ESRD, cardiomyopathy, CVA, coronary artery disease, atrial fibrillation, hypertension presents with concern of generalized weakness beginning this morning. EKG was a vitamin E and showed a sinus rhythm with a report abnormality which is similar to prior. A troponin was negative, and have low suspicion for  ACS. Patient with very mild hypothermia, however no white blood cell count, normal vital signs, normal lactic acid and no infectious symptoms and doubt sepsis at this time.  X-ray shows no sign of pneumonia. Currently awaiting urinalysis to evaluate for this as a potential source.    CT head showed no acute abnormalities. Patient without focal neurologic deficits on exam and have low suspicion for CVA. Patient reports headache, however was slow onset and have  low suspicion for subarachnoid hemorrhage. Laboratory work is at baseline. TSH is ordered and is pending at time of admission.   Patient reports not taking medications, however taking them all yesterday.  Patient does not know the name of the medications, however on reviewing the record, these include gabapentin and clonidine. Patient's fatigue might be secondary to these medications. Viral etiology also on differential. Given significant generalized weakness, will admit for observation, possible MRI.  UA and TSH pending.   Gareth Morgan, MD 12/26/15 [place]

## 2015-12-27 ENCOUNTER — Inpatient Hospital Stay (HOSPITAL_COMMUNITY): Payer: Medicare Other

## 2015-12-27 DIAGNOSIS — R059 Cough, unspecified: Secondary | ICD-10-CM | POA: Insufficient documentation

## 2015-12-27 DIAGNOSIS — R05 Cough: Secondary | ICD-10-CM

## 2015-12-27 LAB — CBC
HCT: 32.7 % — ABNORMAL LOW (ref 39.0–52.0)
HEMOGLOBIN: 10.8 g/dL — AB (ref 13.0–17.0)
MCH: 29.8 pg (ref 26.0–34.0)
MCHC: 33 g/dL (ref 30.0–36.0)
MCV: 90.1 fL (ref 78.0–100.0)
Platelets: 159 10*3/uL (ref 150–400)
RBC: 3.63 MIL/uL — AB (ref 4.22–5.81)
RDW: 17.2 % — ABNORMAL HIGH (ref 11.5–15.5)
WBC: 5.2 10*3/uL (ref 4.0–10.5)

## 2015-12-27 LAB — COMPREHENSIVE METABOLIC PANEL
ALK PHOS: 74 U/L (ref 38–126)
ALT: 8 U/L — AB (ref 17–63)
AST: 9 U/L — ABNORMAL LOW (ref 15–41)
Albumin: 2.8 g/dL — ABNORMAL LOW (ref 3.5–5.0)
Anion gap: 15 (ref 5–15)
BILIRUBIN TOTAL: 0.7 mg/dL (ref 0.3–1.2)
BUN: 47 mg/dL — ABNORMAL HIGH (ref 6–20)
CALCIUM: 8.8 mg/dL — AB (ref 8.9–10.3)
CO2: 25 mmol/L (ref 22–32)
CREATININE: 10.66 mg/dL — AB (ref 0.61–1.24)
Chloride: 100 mmol/L — ABNORMAL LOW (ref 101–111)
GFR, EST AFRICAN AMERICAN: 5 mL/min — AB (ref >60)
GFR, EST NON AFRICAN AMERICAN: 4 mL/min — AB (ref >60)
Glucose, Bld: 81 mg/dL (ref 65–99)
Potassium: 4.9 mmol/L (ref 3.5–5.1)
SODIUM: 140 mmol/L (ref 135–145)
TOTAL PROTEIN: 6 g/dL — AB (ref 6.5–8.1)

## 2015-12-27 MED ORDER — ALTEPLASE 2 MG IJ SOLR
2.0000 mg | Freq: Once | INTRAMUSCULAR | Status: DC | PRN
  Filled 2015-12-27: qty 2

## 2015-12-27 MED ORDER — SODIUM CHLORIDE 0.9 % IV SOLN: 100.0000 mL | INTRAVENOUS | Status: DC | PRN

## 2015-12-27 MED ORDER — LIDOCAINE HCL (PF) 1 % IJ SOLN
5.0000 mL | INTRAMUSCULAR | Status: DC | PRN
Start: 2015-12-27 — End: 2015-12-27

## 2015-12-27 MED ORDER — SODIUM CHLORIDE 0.9 % IV BOLUS (SEPSIS)
1000.0000 mL | Freq: Once | INTRAVENOUS | Status: AC
  Administered 2015-12-27: 1000 mL via INTRAVENOUS

## 2015-12-27 MED ORDER — HEPARIN SODIUM (PORCINE) 1000 UNIT/ML DIALYSIS: 1500.0000 [IU] | Freq: Once | INTRAMUSCULAR | Status: DC

## 2015-12-27 MED ORDER — ACETAMINOPHEN 325 MG PO TABS
ORAL_TABLET | ORAL | Status: AC
  Administered 2015-12-27: 650 mg
  Filled 2015-12-27: qty 2

## 2015-12-27 MED ORDER — LIDOCAINE-PRILOCAINE 2.5-2.5 % EX CREA
1.0000 "application " | TOPICAL_CREAM | CUTANEOUS | Status: DC | PRN
  Filled 2015-12-27: qty 5

## 2015-12-27 MED ORDER — HEPARIN SODIUM (PORCINE) 1000 UNIT/ML DIALYSIS
1000.0000 [IU] | INTRAMUSCULAR | Status: DC | PRN
Start: 2015-12-27 — End: 2015-12-27

## 2015-12-27 MED ORDER — OXYCODONE HCL 5 MG PO TABS
5.0000 mg | ORAL_TABLET | Freq: Four times a day (QID) | ORAL | Status: DC | PRN
  Administered 2015-12-27 – 2016-01-01 (×8): 5 mg via ORAL
  Filled 2015-12-27 (×8): qty 1

## 2015-12-27 MED ORDER — PENTAFLUOROPROP-TETRAFLUOROETH EX AERO: 1.0000 "application " | INHALATION_SPRAY | CUTANEOUS | Status: DC | PRN

## 2015-12-27 MED ORDER — HEPARIN SODIUM (PORCINE) 1000 UNIT/ML DIALYSIS: 1900.0000 [IU] | Freq: Once | INTRAMUSCULAR | Status: DC

## 2015-12-27 MED ORDER — METOCLOPRAMIDE HCL 5 MG/ML IJ SOLN
5.0000 mg | Freq: Four times a day (QID) | INTRAMUSCULAR | Status: DC | PRN
  Administered 2015-12-27: 5 mg via INTRAVENOUS
  Filled 2015-12-27 (×2): qty 2

## 2015-12-27 MED ORDER — LIDOCAINE HCL (PF) 1 % IJ SOLN: 5.0000 mL | INTRAMUSCULAR | Status: DC | PRN

## 2015-12-27 MED ORDER — MENTHOL 3 MG MT LOZG
1.0000 | LOZENGE | OROMUCOSAL | Status: DC | PRN
  Filled 2015-12-27: qty 9

## 2015-12-27 MED ORDER — ALTEPLASE 2 MG IJ SOLR: 2.0000 mg | Freq: Once | INTRAMUSCULAR | Status: DC | PRN

## 2015-12-27 MED ORDER — ONDANSETRON HCL 4 MG/2ML IJ SOLN
INTRAMUSCULAR | Status: AC
  Filled 2015-12-27: qty 2

## 2015-12-27 MED ORDER — HEPARIN SODIUM (PORCINE) 1000 UNIT/ML DIALYSIS: 1000.0000 [IU] | INTRAMUSCULAR | Status: DC | PRN

## 2015-12-27 NOTE — Progress Notes (Signed)
PT Cancellation Note  Patient Details Name: Trevor [name] MRN: [id number] DOB: Aug 03, 1946   Cancelled Treatment:    Reason Eval/Treat Not Completed: Patient at procedure or test/unavailable.  Pt is currently in HD.  PT will check back later today as time allows.  Thanks,   Barbarann Ehlers. Jonnie Truxillo, [place], DPT [phone]   12/27/2015, 1:49 PM

## 2015-12-27 NOTE — Consult Note (Signed)
   [facility] CM Inpatient Consult   12/27/2015  Trevor [name] 1946/04/06 [id number]  Referral received from inpatient RNCM, [facility].  Thank you for this consult.   Unfortunately, this patient is Not eligible for [facility] Management Services.   Reason:  Not a beneficiary currently attributed to one of the [place] listed at this time.  Membership roster was used to verify the patient's non- eligible status. Natividad Brood, RN BSN [facility] Liaison  [phone] business mobile phone Toll free office [phone]

## 2015-12-27 NOTE — Progress Notes (Signed)
Contacted by Infectious Disease. Negative Influenza PCR. Patient is now off of droplet precautions.

## 2015-12-27 NOTE — Progress Notes (Signed)
  [facility] Progress Note   Subjective: still feels weak, unable to stand very well.  Standing BP is 130/ 50 w HR 80, gets weak standing and had to sit down.   Filed Vitals:   12/26/15 1945 12/26/15 2025 12/27/15 0522 12/27/15 0936  BP: 163/72 159/62 150/57 151/64  Pulse: 82 73 61 73  Temp:  98.2 F (36.8 C) 98.3 F (36.8 C)   TempSrc:  Oral Oral   Resp: 16 18 18    SpO2: 98% 100% 98% 98%    Inpatient medications: . aspirin  81 mg Oral Daily  . atorvastatin  40 mg Oral q1800  . calcium acetate  667 mg Oral TID WC  . carvedilol  12.5 mg Oral BID WC  . cloNIDine  0.1 mg Oral BID  . clopidogrel  75 mg Oral Daily  . finasteride  5 mg Oral Daily  . gabapentin  100 mg Oral QHS  . heparin  5,000 Units Subcutaneous 3 times per day  . hydrALAZINE  50 mg Oral TID  . multivitamin  1 tablet Oral QHS  . pantoprazole  40 mg Oral Daily  . sodium chloride  1,000 mL Intravenous Once  . 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 Has chron HTN on multiple meds.  Plan NS bolus 1 L now and 1 L with HD, get vol up and see if this helps. Could not stand just now with standing SBP 130.  2 ESRD MWF , missed yest 3 HTN on 3 meds 4 Anemia stable 5 MBD cont meds  Plan - 2 L NS then reassess standing/ sitting/ walking. HD today no fluid off. HD Friday   [facility] pager [phone]    cell [phone] 12/27/2015, 10:39 AM    Recent Labs Lab 12/26/15 1135 12/27/15 0505  NA 140 140   K 4.5 4.9  CL 100* 100*  CO2 25 25  GLUCOSE 148* 81  BUN 34* 47*  CREATININE 9.33* 10.66*  CALCIUM 9.6 8.8*    Recent Labs Lab 12/27/15 0505  AST 9*  ALT 8*  ALKPHOS 74  BILITOT 0.7  PROT 6.0*  ALBUMIN 2.8*    Recent Labs Lab 12/26/15 1135 12/27/15 0505  WBC 4.5 5.2  HGB 12.8* 10.8*  HCT 38.7* 32.7*  MCV 90.2 90.1  PLT 168 159

## 2015-12-27 NOTE — Progress Notes (Addendum)
Pharmacy: Re: resources for visually impaired patients.   *Services are available through both [employer] and the [place].  Mr Nuon uses both [facility] and the [facility] for his prescriptions. 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  There is a service available thru [facility] [place] [facility] for visually-impaired patients, including a "talking prescription" service.  There is no cost for this service. The patient can request this service through their local [place]. It is handled thru a [employer] in [place], [place] [phone]), and usually provided within 24 hours.  [facility] works through [employer] [phone]), who also work with the [employer] (and some other pharmacies and mail order prescription services).  For prescriptions filled through the [place], Mr. Mccorquodale would have to request the "talking prescription" service ([place]) through the VIST Engineer, agricultural Team) co-ordinator, who would approve the service and inform the [place] to fill future prescriptions in that manner. There is no cost for this service. Mr. Hasser reports having a VA Education officer, museum but not a Publishing rights manager. The [place] social worker should be able to assist him with that contact.  Discussed with Mr. Perreault.  He feels that the [facility] service will be adequate (they just visited on 12/25/15), as they will now be preparing a monthly pill box.  I encouraged him to request the "talking prescription" services via both [facility] and the [place].   Kelvin Cellar, RPh Pager: [phone] 12/27/2015 4:59 PM

## 2015-12-27 NOTE — Progress Notes (Signed)
Patient stated to this RN that he just started taking his meds after dialysis on Monday. Asked this patient why he wasn't taking his meds. Patient told this RN that his pill planner was empty and he could not find anyone to help him with it. Patient stated that, "someone from [place] came and filled his pill planner previously". According to this patient, he has no family to help him and gets his meals from mobile meals. 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 Patient stated that, "maybe he overdosed when he took his meds, he wasn't sure". Patient stated that, "he has not been sleeping well either".   Patient also stated that, "he has these spells about every month but not necessarily after he goes to dialysis".   There is a concern for this patient. Because of his vision issues, it is uncertain that he can see what meds he is taking.  Please consider Social Service consult. Thank you

## 2015-12-27 NOTE — Care Management Note (Signed)
Case Management Note  Patient Details  Name: Trevor [name] MRN: [id number] Date of Birth: 05-Dec-1945  Subjective/Objective:          Admitted with generalized weakness          Action/Plan:  Spoke with patient about discharge plan. Patient stated that he has nursing visits from [facility], first visit was 12/25/15, RN helped him organized his meds on monthly basis. Patient stated that he lives alone and that his son and daughter are too busy to be able to assist him. I checked with [facility], they would not be able to work with patient. Spoke with Mechele Claude from [facility] and asked if they could add social worker to patient's home health. Social worker visits can be added. Patient receives visits from from [employer] for assistance with cleaning and cooking, provided through the [place]. Will continue to follow.       Expected Discharge Date:                  Expected Discharge Plan:  [place]  In-House Referral:  NA  Discharge planning Services  CM Consult  Post Acute Care Choice:  Home Health Choice offered to:  Patient  DME Arranged:    DME Agency:     HH Arranged:  RN, Social Work [employer] Agency:  [place]  Status of Service:  In process, will continue to follow  Medicare Important Message Given:    Date Medicare IM Given:    Medicare IM give by:    Date Additional Medicare IM Given:    Additional Medicare Important Message give by:     If discussed at [place] of Stay Meetings, dates discussed:    Additional Comments:  Nila Nephew, RN 12/27/2015, 3:00 PM

## 2015-12-27 NOTE — Progress Notes (Signed)
[facility] PROGRESS NOTE  Trevor [name] [id number] DOB: 04/25/1946 DOA: 12/26/2015 PCP: [name], MD  Length of stay: 1   Assessment/Plan: Active Problems:   Nonischemic cardiomyopathy ([place])   History of stroke   Chronic combined systolic and diastolic CHF (congestive heart failure) ([place])   Coronary artery disease, non-occlusive: Cath 2013 - Moderate D1 & RCA disease   ESRD on hemodialysis ([place])   Protein-calorie malnutrition, severe (HCC)   Generalized weakness   Weakness generalized   Cough     Brief summary 70 y.o. male ESRD dialyzing at [facility] with Dr. Marval Regal with a EDW of 62kg and presenting with generalized weakness which started after dialysis on Monday but worsened today. He also states that he has had subjective fevers and a productive cough. He is blind in the right eye and sees some shapes through the left eye; therefore was not able to see what he was coughing up. He also has had intermittent headaches which he states is new. He missed dialysis today because he was feeling weak; he is weaker on the right side but is able to ambulate with a walker + uses a wheelchair as well. He denies any nausea, vomiting, diarrhea, abdominal pain, sore throat or rhinorrhea. He also denies chest pain or palpitations. He admits to not taking his medications for a whole month and then restarted taking them on 1/24 after a home health nurse visited [facility].  Assessment and plan Generalized weakness -Etiology unclear at this time, could his heart failure be contributing as the patient has not taken any of his cardiac medications  has some symptoms of a URI, cough, low-grade fever Obtain blood culture 2, flu PCR negative Lactic acid, ammonia, TSH free T4 within normal limits -It is possible that resuming all his home medicines yesterday after a long gap could be potentially contributing, cut down gabapentin dose - no focal neurological symptoms, no meningeal signs   PT/OT evaluation   Nonischemic cardiomyopathy, combined chronic systolic diastolic -Stable, EF improved from last echo to 55% -Continue Coreg, patient has a history of non-ST elevation MI 08/17/15 that showed an EF of 19% Echo 08/15/15 showed EF of 55-60%-according to cardiology on 08/17/15 patient did not need any further ischemia workup He was advised to continue with aspirin Coreg and Plavix, statin, Repeat 2-D echo to confirm EF and if low, consult cardiology   ESRD on HD Monday Wednesday Friday -Notified Dr.Lin of admission, hemodialysis per schedule    History of stroke - exam is nonfocal, CT unremarkable, continue Plavix    Chronic combined systolic and diastolic CHF (congestive heart failure) (HCC) -Volume management HD, continue Coreg    Protein-calorie malnutrition, severe ([place]) -RD consult   DVT prophylaxsis heparin  Code Status:      Code Status Orders        Start     Ordered   12/26/15 2046  Full code   Continuous     12/26/15 2045      Family Communication: Discussed in detail with the patient, all imaging results, lab results explained to the patient   Disposition Plan:  PT OT evaluation      Consultants:  Nephrology  Procedures:  None  Antibiotics: Anti-infectives    None         HPI/Subjective: Complains of generalized weakness, cough for the last few days, denies any worsening shortness of breath although the patient is not very active at baseline  Objective: Filed Vitals:  12/26/15 1945 12/26/15 2025 12/27/15 0522 12/27/15 0936  BP: 163/72 159/62 150/57 151/64  Pulse: 82 73 61 73  Temp:  98.2 F (36.8 C) 98.3 F (36.8 C)   TempSrc:  Oral Oral   Resp: 16 18 18    SpO2: 98% 100% 98% 98%    Intake/Output Summary (Last 24 hours) at 12/27/15 1116 Last data filed at 12/27/15 0900  Gross per 24 hour  Intake    360 ml  Output     75 ml  Net    285 ml    Exam:  General: No acute respiratory distress Lungs: Clear  to auscultation bilaterally without wheezes or crackles Cardiovascular: Regular rate and rhythm without murmur gallop or rub normal S1 and S2 Abdomen: Nontender, nondistended, soft, bowel sounds positive, no rebound, no ascites, no appreciable mass Extremities: No significant cyanosis, clubbing, or edema bilateral lower extremities     Data Review   Micro Results Recent Results (from the past 240 hour(s))  Urine culture     Status: None (Preliminary result)   Collection Time: 12/26/15  5:38 PM  Result Value Ref Range Status   Specimen Description URINE, CLEAN CATCH  Final   Special Requests NONE  Final   Culture TOO YOUNG TO READ  Final   Report Status PENDING  Incomplete    Radiology Reports Dg Chest 2 View  12/26/2015  CLINICAL DATA:  Generalized weakness EXAM: CHEST  2 VIEW COMPARISON:  08/12/2015 FINDINGS: Cardiomegaly. Normal vascularity. New oval density over the right upper lung zone may reflect an overlying object. Minimal basilar hypoaeration change. IMPRESSION: Right upper lobe nodular density may reflect an overlying object. Repeat after removal of overlying objects is recommended. Cardiomegaly without edema. Electronically Signed   By: Marybelle Killings M.D.   On: 12/26/2015 14:04   Ct Head Wo Contrast  12/26/2015  CLINICAL DATA:  Generalized weakness EXAM: CT HEAD WITHOUT CONTRAST TECHNIQUE: Contiguous axial images were obtained from the base of the skull through the vertex without intravenous contrast. COMPARISON:  04/04/2015.  04/03/2015. FINDINGS: Encephalomalacia in the left frontal and right occipital lobes is stable. Chronic ischemic changes in the periventricular white matter. No mass effect, midline shift, or acute hemorrhage. Mastoid air cells are clear. Visualized paranasal sinuses are clear. IMPRESSION: Chronic ischemic changes.  No acute intracranial pathology. Electronically Signed   By: Marybelle Killings M.D.   On: 12/26/2015 13:28     CBC  Recent Labs Lab  12/26/15 1135 12/27/15 0505  WBC 4.5 5.2  HGB 12.8* 10.8*  HCT 38.7* 32.7*  PLT 168 159  MCV 90.2 90.1  MCH 29.8 29.8  MCHC 33.1 33.0  RDW 17.4* 17.2*    Chemistries   Recent Labs Lab 12/26/15 1135 12/27/15 0505  NA 140 140  K 4.5 4.9  CL 100* 100*  CO2 25 25  GLUCOSE 148* 81  BUN 34* 47*  CREATININE 9.33* 10.66*  CALCIUM 9.6 8.8*  AST  --  9*  ALT  --  8*  ALKPHOS  --  74  BILITOT  --  0.7   ------------------------------------------------------------------------------------------------------------------ CrCl cannot be calculated (Unknown ideal weight.). ------------------------------------------------------------------------------------------------------------------ No results for input(s): HGBA1C in the last 72 hours. ------------------------------------------------------------------------------------------------------------------ No results for input(s): CHOL, HDL, LDLCALC, TRIG, CHOLHDL, LDLDIRECT in the last 72 hours. ------------------------------------------------------------------------------------------------------------------  Recent Labs  12/26/15 1601  TSH 1.678   ------------------------------------------------------------------------------------------------------------------ No results for input(s): VITAMINB12, FOLATE, FERRITIN, TIBC, IRON, RETICCTPCT in the last 72 hours.  Coagulation profile No results for input(s): INR,  PROTIME in the last 168 hours.  No results for input(s): DDIMER in the last 72 hours.  Cardiac Enzymes No results for input(s): CKMB, TROPONINI, MYOGLOBIN in the last 168 hours.  Invalid input(s): CK ------------------------------------------------------------------------------------------------------------------ Invalid input(s): POCBNP   CBG: No results for input(s): GLUCAP in the last 168 hours.     Studies: Dg Chest 2 View  12/26/2015  CLINICAL DATA:  Generalized weakness EXAM: CHEST  2 VIEW COMPARISON:   08/12/2015 FINDINGS: Cardiomegaly. Normal vascularity. New oval density over the right upper lung zone may reflect an overlying object. Minimal basilar hypoaeration change. IMPRESSION: Right upper lobe nodular density may reflect an overlying object. Repeat after removal of overlying objects is recommended. Cardiomegaly without edema. Electronically Signed   By: Marybelle Killings M.D.   On: 12/26/2015 14:04   Ct Head Wo Contrast  12/26/2015  CLINICAL DATA:  Generalized weakness EXAM: CT HEAD WITHOUT CONTRAST TECHNIQUE: Contiguous axial images were obtained from the base of the skull through the vertex without intravenous contrast. COMPARISON:  04/04/2015.  04/03/2015. FINDINGS: Encephalomalacia in the left frontal and right occipital lobes is stable. Chronic ischemic changes in the periventricular white matter. No mass effect, midline shift, or acute hemorrhage. Mastoid air cells are clear. Visualized paranasal sinuses are clear. IMPRESSION: Chronic ischemic changes.  No acute intracranial pathology. Electronically Signed   By: Marybelle Killings M.D.   On: 12/26/2015 13:28      Lab Results  Component Value Date   HGBA1C 4.9 04/04/2015   HGBA1C 5.2 07/07/2014   HGBA1C 5.4 10/08/2011   Lab Results  Component Value Date   LDLCALC 35 08/17/2015   CREATININE 10.66* 12/27/2015       Scheduled Meds: . aspirin  81 mg Oral Daily  . atorvastatin  40 mg Oral q1800  . calcium acetate  667 mg Oral TID WC  . carvedilol  12.5 mg Oral BID WC  . cloNIDine  0.1 mg Oral BID  . clopidogrel  75 mg Oral Daily  . finasteride  5 mg Oral Daily  . gabapentin  100 mg Oral QHS  . heparin  5,000 Units Subcutaneous 3 times per day  . hydrALAZINE  50 mg Oral TID  . multivitamin  1 tablet Oral QHS  . pantoprazole  40 mg Oral Daily  . sodium chloride  1,000 mL Intravenous Once  . tamsulosin  0.4 mg Oral BID   Continuous Infusions:   Active Problems:   Nonischemic cardiomyopathy (HCC)   History of stroke   Chronic  combined systolic and diastolic CHF (congestive heart failure) ([place])   Coronary artery disease, non-occlusive: Cath 2013 - Moderate D1 & RCA disease   ESRD on hemodialysis (HCC)   Protein-calorie malnutrition, severe (HCC)   Generalized weakness   Weakness generalized   Cough    Time spent: 45 minutes   [facility] Pager [phone]. If 7PM-7AM, please contact night-coverage at [email or web address], password [facility] 12/27/2015, 11:16 AM  LOS: 1 day

## 2015-12-28 ENCOUNTER — Inpatient Hospital Stay (HOSPITAL_COMMUNITY): Payer: Medicare Other

## 2015-12-28 ENCOUNTER — Observation Stay (HOSPITAL_BASED_OUTPATIENT_CLINIC_OR_DEPARTMENT_OTHER): Payer: Medicare Other

## 2015-12-28 DIAGNOSIS — I361 Nonrheumatic tricuspid (valve) insufficiency: Secondary | ICD-10-CM | POA: Diagnosis not present

## 2015-12-28 LAB — COMPREHENSIVE METABOLIC PANEL
ALBUMIN: 2.5 g/dL — AB (ref 3.5–5.0)
ALK PHOS: 66 U/L (ref 38–126)
ALT: 6 U/L — AB (ref 17–63)
ANION GAP: 10 (ref 5–15)
AST: 11 U/L — ABNORMAL LOW (ref 15–41)
BUN: 21 mg/dL — ABNORMAL HIGH (ref 6–20)
CALCIUM: 8.7 mg/dL — AB (ref 8.9–10.3)
CHLORIDE: 101 mmol/L (ref 101–111)
CO2: 29 mmol/L (ref 22–32)
Creatinine, Ser: 6.23 mg/dL — ABNORMAL HIGH (ref 0.61–1.24)
GFR calc non Af Amer: 8 mL/min — ABNORMAL LOW (ref >60)
GFR, EST AFRICAN AMERICAN: 9 mL/min — AB (ref >60)
GLUCOSE: 79 mg/dL (ref 65–99)
Potassium: 4.1 mmol/L (ref 3.5–5.1)
SODIUM: 140 mmol/L (ref 135–145)
Total Bilirubin: 0.5 mg/dL (ref 0.3–1.2)
Total Protein: 5.6 g/dL — ABNORMAL LOW (ref 6.5–8.1)

## 2015-12-28 LAB — CBC
HCT: 30.1 % — ABNORMAL LOW (ref 39.0–52.0)
HEMOGLOBIN: 9.8 g/dL — AB (ref 13.0–17.0)
MCH: 29.4 pg (ref 26.0–34.0)
MCHC: 32.6 g/dL (ref 30.0–36.0)
MCV: 90.4 fL (ref 78.0–100.0)
PLATELETS: 131 10*3/uL — AB (ref 150–400)
RBC: 3.33 MIL/uL — AB (ref 4.22–5.81)
RDW: 16.9 % — ABNORMAL HIGH (ref 11.5–15.5)
WBC: 4 10*3/uL (ref 4.0–10.5)

## 2015-12-28 LAB — URINE CULTURE

## 2015-12-28 MED ORDER — HYDRALAZINE HCL 25 MG PO TABS
25.0000 mg | ORAL_TABLET | Freq: Three times a day (TID) | ORAL | Status: DC
  Administered 2015-12-28 (×2): 25 mg via ORAL
  Filled 2015-12-28 (×2): qty 1

## 2015-12-28 MED ORDER — SODIUM CHLORIDE 0.9 % IV BOLUS (SEPSIS)
1000.0000 mL | Freq: Once | INTRAVENOUS | Status: AC
  Administered 2015-12-28: 1000 mL via INTRAVENOUS

## 2015-12-28 MED ORDER — SORBITOL 70 % SOLN
30.0000 mL | Freq: Once | Status: AC
  Administered 2015-12-29: 30 mL via ORAL
  Filled 2015-12-28: qty 30

## 2015-12-28 MED ORDER — HYDRALAZINE HCL 20 MG/ML IJ SOLN
10.0000 mg | Freq: Once | INTRAMUSCULAR | Status: AC
  Administered 2015-12-28: 10 mg via INTRAVENOUS
  Filled 2015-12-28: qty 1

## 2015-12-28 MED ORDER — BENZONATATE 100 MG PO CAPS
100.0000 mg | ORAL_CAPSULE | Freq: Three times a day (TID) | ORAL | Status: DC | PRN
  Administered 2015-12-29: 100 mg via ORAL
  Filled 2015-12-28: qty 1

## 2015-12-28 NOTE — Care Management Important Message (Signed)
Important Message  Patient Details  Name: Trevor [name] MRN: [id number] Date of Birth: 25-Oct-1946   Medicare Important Message Given:  Yes    Barb Merino [name] 12/28/2015, 4:28 PM
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
## 2015-12-28 NOTE — Progress Notes (Signed)
  [facility] Progress Note   Subjective: Lying BP 150/70                         Sitting BP 125/50  HR 72                         Standing BP 115/50  HR 78 Still unable to stand for more than 30 sec, c/o feeling "dizzy".  No n/v/d, abd pain or SOB. GOt 2L NS yesterday.   Filed Vitals:   12/27/15 1630 12/27/15 1632 12/27/15 2029 12/28/15 0434  BP: 151/70 153/70 158/75 148/73  Pulse: 84 85 78 64  Temp:  98 F (36.7 C) 98.2 F (36.8 C) 98.6 F (37 C)  TempSrc:  Oral Oral Oral  Resp:  20 18 16   Weight:  63.5 kg (139 lb 15.9 oz)    SpO2:  98% 98% 99%    Inpatient medications: . aspirin  81 mg Oral Daily  . atorvastatin  40 mg Oral q1800  . calcium acetate  667 mg Oral TID WC  . carvedilol  12.5 mg Oral BID WC  . cloNIDine  0.1 mg Oral BID  . clopidogrel  75 mg Oral Daily  . finasteride  5 mg Oral Daily  . gabapentin  100 mg Oral QHS  . heparin  5,000 Units Subcutaneous 3 times per day  . hydrALAZINE  50 mg Oral TID  . multivitamin  1 tablet Oral QHS  . pantoprazole  40 mg Oral Daily  . tamsulosin  0.4 mg Oral BID     acetaminophen **OR** acetaminophen, menthol-cetylpyridinium, metoCLOPramide (REGLAN) injection, ondansetron **OR** ondansetron (ZOFRAN) IV, oxyCODONE  Exam: Looks fatigued Standing wt 60.7kg Standing BP 130/70, lying BP 170/ 90 No jvd Chest clear bilat RRR no rmg Abd soft no hsm or ascites No LE or UE edema LUA AVF +bruit Neuro is blind, alert   MWF [place] started 2014 4h  62kg  2/2.25 bath  Hep 1900  LUA AVF (BBF)   Assessment: 1 Fatigue/ gen weakness/ orthostatic dizziness - BP's relatively low on sitting/ standing for this patient (115/50) and unable to stand again today.  Head CT negative, CXR clear, normal cardiac shadow, no pulsus. No signs of infection, UA neg.  Plan decrease BP meds , give 1 more liter NS bolus, reevaluate orthostatics later today.  Try raising dry wt.  2 ESRD MWF HD today 3 HTN on 3 meds > decreasing 4 Anemia  stable 5 MBD cont meds  Plan - as above   Kelly Splinter MD [facility] pager [phone]    cell [phone] 12/28/2015, 7:28 AM    Recent Labs Lab 12/26/15 1135 12/27/15 0505 12/28/15 0350  NA 140 140 140  K 4.5 4.9 4.1  CL 100* 100* 101  CO2 25 25 29   GLUCOSE 148* 81 79  BUN 34* 47* 21*  CREATININE 9.33* 10.66* 6.23*  CALCIUM 9.6 8.8* 8.7*    Recent Labs Lab 12/27/15 0505 12/28/15 0350  AST 9* 11*  ALT 8* 6*  ALKPHOS 74 66  BILITOT 0.7 0.5  PROT 6.0* 5.6*  ALBUMIN 2.8* 2.5*    Recent Labs Lab 12/26/15 1135 12/27/15 0505 12/28/15 0350  WBC 4.5 5.2 4.0  HGB 12.8* 10.8* 9.8*  HCT 38.7* 32.7* 30.1*  MCV 90.2 90.1 90.4  PLT 168 159 131*

## 2015-12-28 NOTE — Progress Notes (Signed)
[facility] PROGRESS NOTE  BUCHANAN HOUSEN [id number] DOB: 04-23-1946 DOA: 12/26/2015 PCP: [name], MD  Length of stay: 2   Assessment/Plan: Active Problems:   Nonischemic cardiomyopathy ([place])   History of stroke   Chronic combined systolic and diastolic CHF (congestive heart failure) ([place])   Coronary artery disease, non-occlusive: Cath 2013 - Moderate D1 & RCA disease   ESRD on hemodialysis ([place])   Protein-calorie malnutrition, severe (HCC)   Generalized weakness   Weakness generalized   Cough     Brief summary 70 y.o. male ESRD dialyzing at [facility] with Dr. Marval Regal with a EDW of 62kg and presenting with generalized weakness which started after dialysis on Monday but worsened today. He also states that he has had subjective fevers and a productive cough. He is blind in the right eye and sees some shapes through the left eye; therefore was not able to see what he was coughing up. He also has had intermittent headaches which he states is new. He missed dialysis today because he was feeling weak; he is weaker on the right side but is able to ambulate with a walker + uses a wheelchair as well. He denies any nausea, vomiting, diarrhea, abdominal pain, sore throat or rhinorrhea. He also denies chest pain or palpitations. He admits to not taking his medications for a whole month and then restarted taking them on 1/24 after a home health nurse visited [facility].  Assessment and plan Generalized weakness -Etiology unclear at this time, could his heart failure be contributing as the patient has not taken any of his cardiac medications  has some symptoms of a URI, cough, low-grade fever, chest x-ray negative Obtain blood culture 2, flu PCR negative Lactic acid, ammonia, TSH free T4 within normal limits -It is possible that resuming all his home medicines yesterday after a long gap could be potentially contributing, cut down gabapentin dose - no focal neurological symptoms,  no meningeal signs  PT/OT evaluation pending reduce  blood pressure medications, received a total of 3 L of normal saline since yesterday at dialysis Continue to check orthostatics   Nonischemic cardiomyopathy, combined chronic systolic diastolic -Stable, EF on 0000000 was 55-60% -Continue Coreg, patient has a history of non-ST elevation MI 08/17/15 that showed an EF of 19% Echo 08/15/15 showed EF of 55-60%-according to cardiology on 08/17/15 patient did not need any further ischemia workup He was advised to continue with aspirin Coreg and Plavix, statin, Repeat 2-D echo to confirm EF and if low, consult cardiology if echo is abnormal   ESRD on HD Monday Wednesday Friday Nephrology following    History of stroke - exam is nonfocal, CT unremarkable, continue Plavix    Chronic combined systolic and diastolic CHF (congestive heart failure) ([place]) -Volume management HD, continue Coreg    Protein-calorie malnutrition, severe ([place]) -RD consult   DVT prophylaxsis heparin  Code Status:      Code Status Orders        Start     Ordered   12/26/15 2046  Full code   Continuous     12/26/15 2045      Family Communication: Discussed in detail with the patient, all imaging results, lab results explained to the patient   Disposition Plan:  PT OT evaluation, pending      Consultants:  Nephrology  Procedures:  None  Antibiotics: Anti-infectives    None         HPI/Subjective: Still unable to stand ,  c/o feeling "dizzy". No n/v/d, abd pain or SOB denies any worsening shortness of breath although the patient is not very active at baseline  Objective: Filed Vitals:   12/28/15 1000 12/28/15 1030 12/28/15 1100 12/28/15 1130  BP: 156/66 143/64 141/64 164/70  Pulse: 60 59 66 72  Temp:      TempSrc:      Resp:      Weight:      SpO2:        Intake/Output Summary (Last 24 hours) at 12/28/15 1150 Last data filed at 12/28/15 [id number]  Gross per 24 hour  Intake     480 ml  Output   -925 ml  Net   1405 ml    Exam:  General: No acute respiratory distress Lungs: Clear to auscultation bilaterally without wheezes or crackles Cardiovascular: Regular rate and rhythm without murmur gallop or rub normal S1 and S2 Abdomen: Nontender, nondistended, soft, bowel sounds positive, no rebound, no ascites, no appreciable mass Extremities: No significant cyanosis, clubbing, or edema bilateral lower extremities     Data Review   Micro Results Recent Results (from the past 240 hour(s))  Urine culture     Status: None   Collection Time: 12/26/15  5:38 PM  Result Value Ref Range Status   Specimen Description URINE, CLEAN CATCH  Final   Special Requests NONE  Final   Culture MULTIPLE SPECIES PRESENT, SUGGEST RECOLLECTION  Final   Report Status 12/28/2015 FINAL  Final    Radiology Reports Dg Chest 2 View  12/26/2015  CLINICAL DATA:  Generalized weakness EXAM: CHEST  2 VIEW COMPARISON:  08/12/2015 FINDINGS: Cardiomegaly. Normal vascularity. New oval density over the right upper lung zone may reflect an overlying object. Minimal basilar hypoaeration change. IMPRESSION: Right upper lobe nodular density may reflect an overlying object. Repeat after removal of overlying objects is recommended. Cardiomegaly without edema. Electronically Signed   By: Marybelle Killings M.D.   On: 12/26/2015 14:04   Ct Head Wo Contrast  12/26/2015  CLINICAL DATA:  Generalized weakness EXAM: CT HEAD WITHOUT CONTRAST TECHNIQUE: Contiguous axial images were obtained from the base of the skull through the vertex without intravenous contrast. COMPARISON:  04/04/2015.  04/03/2015. FINDINGS: Encephalomalacia in the left frontal and right occipital lobes is stable. Chronic ischemic changes in the periventricular white matter. No mass effect, midline shift, or acute hemorrhage. Mastoid air cells are clear. Visualized paranasal sinuses are clear. IMPRESSION: Chronic ischemic changes.  No acute intracranial  pathology. Electronically Signed   By: Marybelle Killings M.D.   On: 12/26/2015 13:28   Dg Chest Port 1 View  12/27/2015  CLINICAL DATA:  Productive cough for 2 days, weakness - hx of MI, stroke, htn, SOB EXAM: PORTABLE CHEST 1 VIEW COMPARISON:  12/25/2013 FINDINGS: The heart size and mediastinal contours are within normal limits. Both lungs are clear. The visualized skeletal structures are unremarkable. IMPRESSION: No active disease. Electronically Signed   By: Nolon Nations M.D.   On: 12/27/2015 12:21     CBC  Recent Labs Lab 12/26/15 1135 12/27/15 0505 12/28/15 0350  WBC 4.5 5.2 4.0  HGB 12.8* 10.8* 9.8*  HCT 38.7* 32.7* 30.1*  PLT 168 159 131*  MCV 90.2 90.1 90.4  MCH 29.8 29.8 29.4  MCHC 33.1 33.0 32.6  RDW 17.4* 17.2* 16.9*    Chemistries   Recent Labs Lab 12/26/15 1135 12/27/15 0505 12/28/15 0350  NA 140 140 140  K 4.5 4.9 4.1  CL 100* 100* 101  CO2  25 25 29   GLUCOSE 148* 81 79  BUN 34* 47* 21*  CREATININE 9.33* 10.66* 6.23*  CALCIUM 9.6 8.8* 8.7*  AST  --  9* 11*  ALT  --  8* 6*  ALKPHOS  --  74 66  BILITOT  --  0.7 0.5   ------------------------------------------------------------------------------------------------------------------ estimated creatinine clearance is 10.1 mL/min (by C-G formula based on Cr of 6.23). ------------------------------------------------------------------------------------------------------------------ No results for input(s): HGBA1C in the last 72 hours. ------------------------------------------------------------------------------------------------------------------ No results for input(s): CHOL, HDL, LDLCALC, TRIG, CHOLHDL, LDLDIRECT in the last 72 hours. ------------------------------------------------------------------------------------------------------------------  Recent Labs  12/26/15 1601  TSH 1.678   ------------------------------------------------------------------------------------------------------------------ No  results for input(s): VITAMINB12, FOLATE, FERRITIN, TIBC, IRON, RETICCTPCT in the last 72 hours.  Coagulation profile No results for input(s): INR, PROTIME in the last 168 hours.  No results for input(s): DDIMER in the last 72 hours.  Cardiac Enzymes No results for input(s): CKMB, TROPONINI, MYOGLOBIN in the last 168 hours.  Invalid input(s): CK ------------------------------------------------------------------------------------------------------------------ Invalid input(s): POCBNP   CBG: No results for input(s): GLUCAP in the last 168 hours.     Studies: Dg Chest 2 View  12/26/2015  CLINICAL DATA:  Generalized weakness EXAM: CHEST  2 VIEW COMPARISON:  08/12/2015 FINDINGS: Cardiomegaly. Normal vascularity. New oval density over the right upper lung zone may reflect an overlying object. Minimal basilar hypoaeration change. IMPRESSION: Right upper lobe nodular density may reflect an overlying object. Repeat after removal of overlying objects is recommended. Cardiomegaly without edema. Electronically Signed   By: Marybelle Killings M.D.   On: 12/26/2015 14:04   Ct Head Wo Contrast  12/26/2015  CLINICAL DATA:  Generalized weakness EXAM: CT HEAD WITHOUT CONTRAST TECHNIQUE: Contiguous axial images were obtained from the base of the skull through the vertex without intravenous contrast. COMPARISON:  04/04/2015.  04/03/2015. FINDINGS: Encephalomalacia in the left frontal and right occipital lobes is stable. Chronic ischemic changes in the periventricular white matter. No mass effect, midline shift, or acute hemorrhage. Mastoid air cells are clear. Visualized paranasal sinuses are clear. IMPRESSION: Chronic ischemic changes.  No acute intracranial pathology. Electronically Signed   By: Marybelle Killings M.D.   On: 12/26/2015 13:28   Dg Chest Port 1 View  12/27/2015  CLINICAL DATA:  Productive cough for 2 days, weakness - hx of MI, stroke, htn, SOB EXAM: PORTABLE CHEST 1 VIEW COMPARISON:  12/25/2013  FINDINGS: The heart size and mediastinal contours are within normal limits. Both lungs are clear. The visualized skeletal structures are unremarkable. IMPRESSION: No active disease. Electronically Signed   By: Nolon Nations M.D.   On: 12/27/2015 12:21      Lab Results  Component Value Date   HGBA1C 4.9 04/04/2015   HGBA1C 5.2 07/07/2014   HGBA1C 5.4 10/08/2011   Lab Results  Component Value Date   LDLCALC 35 08/17/2015   CREATININE 6.23* 12/28/2015       Scheduled Meds: . aspirin  81 mg Oral Daily  . atorvastatin  40 mg Oral q1800  . calcium acetate  667 mg Oral TID WC  . clopidogrel  75 mg Oral Daily  . finasteride  5 mg Oral Daily  . gabapentin  100 mg Oral QHS  . heparin  5,000 Units Subcutaneous 3 times per day  . hydrALAZINE  25 mg Oral TID  . multivitamin  1 tablet Oral QHS  . pantoprazole  40 mg Oral Daily  . sodium chloride  1,000 mL Intravenous Once  . tamsulosin  0.4 mg Oral BID  Continuous Infusions:   Active Problems:   Nonischemic cardiomyopathy (HCC)   History of stroke   Chronic combined systolic and diastolic CHF (congestive heart failure) ([place])   Coronary artery disease, non-occlusive: Cath 2013 - Moderate D1 & RCA disease   ESRD on hemodialysis (HCC)   Protein-calorie malnutrition, severe (HCC)   Generalized weakness   Weakness generalized   Cough    Time spent: 45 minutes   [facility] Pager [phone]. If 7PM-7AM, please contact night-coverage at [email or web address], password [facility] 12/28/2015, 11:50 AM  LOS: 2 days

## 2015-12-28 NOTE — Care Management Obs Status (Signed)
[place] NOTIFICATION   Patient Details  Name: ALOYS GLANZMAN MRN: [id number] Date of Birth: 09/04/1946   Medicare Observation Status Notification Given:  Yes    Nila Nephew, RN 12/28/2015, 3:14 PM

## 2015-12-28 NOTE — Progress Notes (Signed)
  Echocardiogram 2D Echocardiogram has been performed.  Trevor [name] 12/28/2015, 5:00 PM

## 2015-12-28 NOTE — Progress Notes (Signed)
PT Cancellation Note  Patient Details Name: Trevor [name] MRN: [id number] DOB: [birth year]   Cancelled Treatment:    Reason Eval/Treat Not Completed: Patient at procedure or test/unavailable (pt in HD)   Lanetta Inch [facility] 12/28/2015, 10:43 AM Elwyn Reach, [place]

## 2015-12-29 ENCOUNTER — Observation Stay (HOSPITAL_COMMUNITY): Payer: Medicare Other

## 2015-12-29 DIAGNOSIS — R42 Dizziness and giddiness: Secondary | ICD-10-CM | POA: Diagnosis not present

## 2015-12-29 LAB — HEPATITIS B SURFACE ANTIGEN: Hepatitis B Surface Ag: NEGATIVE

## 2015-12-29 MED ORDER — CLONIDINE HCL 0.1 MG PO TABS
0.1000 mg | ORAL_TABLET | Freq: Two times a day (BID) | ORAL | Status: DC
  Administered 2015-12-29 – 2016-01-01 (×7): 0.1 mg via ORAL
  Filled 2015-12-29 (×7): qty 1

## 2015-12-29 MED ORDER — HYDRALAZINE HCL 20 MG/ML IJ SOLN
10.0000 mg | Freq: Once | INTRAMUSCULAR | Status: AC
  Administered 2015-12-29: 10 mg via INTRAVENOUS
  Filled 2015-12-29: qty 1

## 2015-12-29 MED ORDER — HYDRALAZINE HCL 50 MG PO TABS
50.0000 mg | ORAL_TABLET | Freq: Three times a day (TID) | ORAL | Status: DC
  Administered 2015-12-29 – 2015-12-30 (×6): 50 mg via ORAL
  Filled 2015-12-29 (×6): qty 1

## 2015-12-29 NOTE — Progress Notes (Signed)
  Trevor [name] Progress Note   Subjective: Feels " a little better".   Sitting BP 150/60  HR 80 Standing BP 140/50 HR 80, got "dizzy" after 30 seconds and had to sit down, then lie down.  Feels the best lying down.    Filed Vitals:   12/29/15 0515 12/29/15 0530 12/29/15 0627 12/29/15 0629  BP:   187/73 101/39  Pulse:   89 79  Temp: 99 F (37.2 C) 99 F (37.2 C)    TempSrc: Oral Rectal    Resp:      Weight:      SpO2:        Inpatient medications: . aspirin  81 mg Oral Daily  . atorvastatin  40 mg Oral q1800  . calcium acetate  667 mg Oral TID WC  . cloNIDine  0.1 mg Oral BID  . clopidogrel  75 mg Oral Daily  . finasteride  5 mg Oral Daily  . gabapentin  100 mg Oral QHS  . heparin  5,000 Units Subcutaneous 3 times per day  . hydrALAZINE  50 mg Oral TID  . multivitamin  1 tablet Oral QHS  . pantoprazole  40 mg Oral Daily  . 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 Gave 3L NS and dec'd BP meds but still unable to remain standing for >30 sec with normal standing BP 140/50.  ? cerebrovasc insuff, vestibular.... Will d/w primary, consider MRA.MRI 2 ESRD MWF 3 HTN resumed clonidine and hydral to usual dose; holding coreg 4 Anemia stable 5 MBD cont meds  Plan - as above   Kelly Splinter MD [name] pager  [phone]    cell [phone] 12/29/2015, 10:42 AM    Recent Labs Lab 12/26/15 1135 12/27/15 0505 12/28/15 0350  NA 140 140 140  K 4.5 4.9 4.1  CL 100* 100* 101  CO2 25 25 29   GLUCOSE 148* 81 79  BUN 34* 47* 21*  CREATININE 9.33* 10.66* 6.23*  CALCIUM 9.6 8.8* 8.7*    Recent Labs Lab 12/27/15 0505 12/28/15 0350  AST 9* 11*  ALT 8* 6*  ALKPHOS 74 66  BILITOT 0.7 0.5  PROT 6.0* 5.6*  ALBUMIN 2.8* 2.5*    Recent Labs Lab 12/26/15 1135 12/27/15 0505 12/28/15 0350  WBC 4.5 5.2 4.0  HGB 12.8* 10.8* 9.8*  HCT 38.7* 32.7* 30.1*  MCV 90.2 90.1 90.4  PLT 168 159 131*

## 2015-12-29 NOTE — Progress Notes (Signed)
[facility] PROGRESS NOTE  Trevor [name] [id number] DOB: 02-Jan-1946 DOA: 12/26/2015 PCP: [name], MD  Length of stay: 3   Assessment/Plan: Active Problems:   Nonischemic cardiomyopathy ([place])   History of stroke   Chronic combined systolic and diastolic CHF (congestive heart failure) ([place])   Coronary artery disease, non-occlusive: Cath 2013 - Moderate D1 & RCA disease   ESRD on hemodialysis ([place])   Protein-calorie malnutrition, severe (HCC)   Generalized weakness   Weakness generalized   Cough     Brief summary 70 y.o. male ESRD dialyzing at [facility] with Dr. Marval Regal with a EDW of 62kg and presenting with generalized weakness which started after dialysis on Monday but worsened today. He also states that he has had subjective fevers and a productive cough. He is blind in the right eye and sees some shapes through the left eye; therefore was not able to see what he was coughing up. He also has had intermittent headaches which he states is new. He missed dialysis today because he was feeling weak; he is weaker on the right side but is able to ambulate with a walker + uses a wheelchair as well. He denies any nausea, vomiting, diarrhea, abdominal pain, sore throat or rhinorrhea. He also denies chest pain or palpitations. He admits to not taking his medications for a whole month and then restarted taking them on 1/24 after a home health nurse visited [facility].  Assessment and plan Generalized weakness, patient unable to stand -Etiology unclear at this time, could his heart failure be contributing as the patient has not taken any of his cardiac medications  has some symptoms of a URI, cough, low-grade fever, chest x-ray negative Blood culture no growth so far times 2, flu PCR negative Lactic acid, ammonia, TSH free T4 within normal limits -It is possible that resuming all his home medicines yesterday after a long gap could be potentially contributing, - no focal  neurological symptoms, no meningeal signs  PT/OT evaluation pending reduce  blood pressure medications, received a total of 3 L of normal saline with hemodialysis Patient has resting hypertension, [id number] to [id number] systolic Orthostatics negative when last checked on 1/27 MRI MRA of the brain to rule out CVA, vertebrobasilar insufficiency, cervical spinal stenosis?  Nonischemic cardiomyopathy, combined chronic systolic diastolic On  0000000 EF was 55-60%, repeat 2-D echo on 1/27 shows EF of 45-50% -History of non-ST elevation MI 08/17/15 that showed an EF of 19%, He was advised to continue with aspirin Coreg and Plavix, statin, Currently Coreg is on hold,    Hypertension-resume Coreg 12.5 mg BID , continue hydralazine  ESRD on HD Monday Wednesday Friday Nephrology following    History of stroke - exam is nonfocal, CT unremarkable, continue Plavix    Chronic combined systolic and diastolic CHF (congestive heart failure)  -Volume management HD, continue Coreg    Protein-calorie malnutrition, severe ([place]) -RD consult   DVT prophylaxsis heparin  Code Status:      Code Status Orders        Start     Ordered   12/26/15 2046  Full code   Continuous     12/26/15 2045      Family Communication: Discussed in detail with the patient, all imaging results, lab results explained to the patient   Disposition Plan:  PT OT evaluation, pending      Consultants:  Nephrology  Procedures:  None  Antibiotics: Anti-infectives    None  HPI/Subjective: Still unable to stand , c/o feeling "dizzy". No n/v/d, abd pain or SOB denies any worsening shortness of breath although the patient is not very active at baseline  Objective: Filed Vitals:   12/29/15 0515 12/29/15 0530 12/29/15 0627 12/29/15 0629  BP:   187/73 101/39  Pulse:   89 79  Temp: 99 F (37.2 C) 99 F (37.2 C)    TempSrc: Oral Rectal    Resp:      Weight:      SpO2:        Intake/Output  Summary (Last 24 hours) at 12/29/15 1055 Last data filed at 12/28/15 1318  Gross per 24 hour  Intake      0 ml  Output  -1000 ml  Net   1000 ml    Exam:  General: No acute respiratory distress Lungs: Clear to auscultation bilaterally without wheezes or crackles Cardiovascular: Regular rate and rhythm without murmur gallop or rub normal S1 and S2 Abdomen: Nontender, nondistended, soft, bowel sounds positive, no rebound, no ascites, no appreciable mass Extremities: No significant cyanosis, clubbing, or edema bilateral lower extremities     Data Review   Micro Results Recent Results (from the past 240 hour(s))  Urine culture     Status: None   Collection Time: 12/26/15  5:38 PM  Result Value Ref Range Status   Specimen Description URINE, CLEAN CATCH  Final   Special Requests NONE  Final   Culture MULTIPLE SPECIES PRESENT, SUGGEST RECOLLECTION  Final   Report Status 12/28/2015 FINAL  Final  Culture, blood (Routine X 2) w Reflex to ID Panel     Status: None (Preliminary result)   Collection Time: 12/27/15  1:30 PM  Result Value Ref Range Status   Specimen Description HEMODIALYSIS LINE  Final   Special Requests BOTTLES DRAWN AEROBIC AND ANAEROBIC 10MLS  Final   Culture NO GROWTH < 24 HOURS  Final   Report Status PENDING  Incomplete  Culture, blood (Routine X 2) w Reflex to ID Panel     Status: None (Preliminary result)   Collection Time: 12/27/15  1:40 PM  Result Value Ref Range Status   Specimen Description HEMODIALYSIS LINE  Final   Special Requests BOTTLES DRAWN AEROBIC AND ANAEROBIC 10MLS  Final   Culture NO GROWTH < 24 HOURS  Final   Report Status PENDING  Incomplete    Radiology Reports Dg Chest 2 View  12/26/2015  CLINICAL DATA:  Generalized weakness EXAM: CHEST  2 VIEW COMPARISON:  08/12/2015 FINDINGS: Cardiomegaly. Normal vascularity. New oval density over the right upper lung zone may reflect an overlying object. Minimal basilar hypoaeration change. IMPRESSION:  Right upper lobe nodular density may reflect an overlying object. Repeat after removal of overlying objects is recommended. Cardiomegaly without edema. Electronically Signed   By: Marybelle Killings M.D.   On: 12/26/2015 14:04   Ct Head Wo Contrast  12/26/2015  CLINICAL DATA:  Generalized weakness EXAM: CT HEAD WITHOUT CONTRAST TECHNIQUE: Contiguous axial images were obtained from the base of the skull through the vertex without intravenous contrast. COMPARISON:  04/04/2015.  04/03/2015. FINDINGS: Encephalomalacia in the left frontal and right occipital lobes is stable. Chronic ischemic changes in the periventricular white matter. No mass effect, midline shift, or acute hemorrhage. Mastoid air cells are clear. Visualized paranasal sinuses are clear. IMPRESSION: Chronic ischemic changes.  No acute intracranial pathology. Electronically Signed   By: Marybelle Killings M.D.   On: 12/26/2015 13:28   Dg Chest [facility] [address]  12/27/2015  CLINICAL DATA:  Productive cough for 2 days, weakness - hx of MI, stroke, htn, SOB EXAM: PORTABLE CHEST 1 VIEW COMPARISON:  12/25/2013 FINDINGS: The heart size and mediastinal contours are within normal limits. Both lungs are clear. The visualized skeletal structures are unremarkable. IMPRESSION: No active disease. Electronically Signed   By: Nolon Nations M.D.   On: 12/27/2015 12:21     CBC  Recent Labs Lab 12/26/15 1135 12/27/15 0505 12/28/15 0350  WBC 4.5 5.2 4.0  HGB 12.8* 10.8* 9.8*  HCT 38.7* 32.7* 30.1*  PLT 168 159 131*  MCV 90.2 90.1 90.4  MCH 29.8 29.8 29.4  MCHC 33.1 33.0 32.6  RDW 17.4* 17.2* 16.9*    Chemistries   Recent Labs Lab 12/26/15 1135 12/27/15 0505 12/28/15 0350  NA 140 140 140  K 4.5 4.9 4.1  CL 100* 100* 101  CO2 25 25 29   GLUCOSE 148* 81 79  BUN 34* 47* 21*  CREATININE 9.33* 10.66* 6.23*  CALCIUM 9.6 8.8* 8.7*  AST  --  9* 11*  ALT  --  8* 6*  ALKPHOS  --  74 66  BILITOT  --  0.7 0.5    ------------------------------------------------------------------------------------------------------------------ estimated creatinine clearance is 10.1 mL/min (by C-G formula based on Cr of 6.23). ------------------------------------------------------------------------------------------------------------------ No results for input(s): HGBA1C in the last 72 hours. ------------------------------------------------------------------------------------------------------------------ No results for input(s): CHOL, HDL, LDLCALC, TRIG, CHOLHDL, LDLDIRECT in the last 72 hours. ------------------------------------------------------------------------------------------------------------------  Recent Labs  12/26/15 1601  TSH 1.678   ------------------------------------------------------------------------------------------------------------------ No results for input(s): VITAMINB12, FOLATE, FERRITIN, TIBC, IRON, RETICCTPCT in the last 72 hours.  Coagulation profile No results for input(s): INR, PROTIME in the last 168 hours.  No results for input(s): DDIMER in the last 72 hours.  Cardiac Enzymes No results for input(s): CKMB, TROPONINI, MYOGLOBIN in the last 168 hours.  Invalid input(s): CK ------------------------------------------------------------------------------------------------------------------ Invalid input(s): POCBNP   CBG: No results for input(s): GLUCAP in the last 168 hours.     Studies: Dg Chest Port 1 View  12/27/2015  CLINICAL DATA:  Productive cough for 2 days, weakness - hx of MI, stroke, htn, SOB EXAM: PORTABLE CHEST 1 VIEW COMPARISON:  12/25/2013 FINDINGS: The heart size and mediastinal contours are within normal limits. Both lungs are clear. The visualized skeletal structures are unremarkable. IMPRESSION: No active disease. Electronically Signed   By: Nolon Nations M.D.   On: 12/27/2015 12:21      Lab Results  Component Value Date   HGBA1C 4.9 04/04/2015    HGBA1C 5.2 07/07/2014   HGBA1C 5.4 10/08/2011   Lab Results  Component Value Date   LDLCALC 35 08/17/2015   CREATININE 6.23* 12/28/2015       Scheduled Meds: . aspirin  81 mg Oral Daily  . atorvastatin  40 mg Oral q1800  . calcium acetate  667 mg Oral TID WC  . cloNIDine  0.1 mg Oral BID  . clopidogrel  75 mg Oral Daily  . finasteride  5 mg Oral Daily  . gabapentin  100 mg Oral QHS  . heparin  5,000 Units Subcutaneous 3 times per day  . hydrALAZINE  50 mg Oral TID  . multivitamin  1 tablet Oral QHS  . pantoprazole  40 mg Oral Daily  . tamsulosin  0.4 mg Oral BID   Continuous Infusions:   Active Problems:   Nonischemic cardiomyopathy (HCC)   History of stroke   Chronic combined systolic and diastolic CHF (congestive heart failure) ([place])   Coronary artery  disease, non-occlusive: Cath 2013 - Moderate D1 & RCA disease   ESRD on hemodialysis (HCC)   Protein-calorie malnutrition, severe (HCC)   Generalized weakness   Weakness generalized   Cough    Time spent: 45 minutes   [facility] Pager [phone]. If 7PM-7AM, please contact night-coverage at [email or web address], password [facility] 12/29/2015, 10:55 AM  LOS: 3 days

## 2015-12-29 NOTE — Progress Notes (Signed)
Received result of Norostatin Hep B is negative from lab at 0415.Marland Kitchen

## 2015-12-29 NOTE — Progress Notes (Signed)
Patient's b/p has been elevated throughout the night. At 2011, b/p was 182/72. Gave scheduled 25 mg hydralazine. Repeat b/p at 2159 188/72. Order given by on call [facility] one time dose of 10 mg IV hydralazine given at 2310. Repeat b/p 168/57 at 0015.   B/P taken at 0430 - 173/68 automatic. Manual b/p was obtained two different times 190/78 and then 188/80. See vitals documentation for exact times. Patient's pulse in right arm (brachial) was strong, bounding, and could visually see it pulse. Left side of neck was obvious pulsing as well. Right side of neck no obvious pulsing. Another one time order 10 mg hydralazine given IV by oncall [facility]. Temp oral and rectal was 99. Repeat b/p at 0630 187/71.   Rapid response called to assess patient. Possibility of patient being in fluid overload since patient received a total of 3 liters and no fluid was removed at dialysis during the last two days. Will make oncoming MD aware of situation. Will continue to monitor.

## 2015-12-29 NOTE — Progress Notes (Signed)
PT Cancellation Note  Patient Details Name: Trevor [name] MRN: [id number] DOB: [birth year]   Cancelled Treatment:    Reason Eval/Treat Not Completed: Medical issues which prohibited therapy;Other (comment) Per RN pt cannot stand more than a minute or two due to lightheadedness/dizziness.  MD ordered MRI.  PT will check back after MRI results posted later today or tomorrow.  Thanks,   Barbarann Ehlers. [place], [place], DPT [phone]   12/29/2015, 11:42 AM

## 2015-12-30 ENCOUNTER — Inpatient Hospital Stay (HOSPITAL_COMMUNITY): Payer: Medicare Other

## 2015-12-30 LAB — BLOOD GAS, ARTERIAL
Acid-Base Excess: 1.1 mmol/L (ref 0.0–2.0)
Bicarbonate: 24.6 mEq/L — ABNORMAL HIGH (ref 20.0–24.0)
DRAWN BY: 213381
O2 CONTENT: 3 L/min
O2 Saturation: 98 %
PCO2 ART: 35 mmHg (ref 35.0–45.0)
PH ART: 7.461 — AB (ref 7.350–7.450)
Patient temperature: 98.6
TCO2: 25.7 mmol/L (ref 0–100)
pO2, Arterial: 106 mmHg — ABNORMAL HIGH (ref 80.0–100.0)

## 2015-12-30 MED ORDER — MIDODRINE HCL 5 MG PO TABS
5.0000 mg | ORAL_TABLET | Freq: Three times a day (TID) | ORAL | Status: DC
  Administered 2015-12-30 – 2016-01-03 (×6): 5 mg via ORAL
  Filled 2015-12-30 (×16): qty 1

## 2015-12-30 MED ORDER — CARVEDILOL 12.5 MG PO TABS
12.5000 mg | ORAL_TABLET | Freq: Two times a day (BID) | ORAL | Status: DC
  Administered 2015-12-30 – 2016-01-03 (×6): 12.5 mg via ORAL
  Filled 2015-12-30 (×6): qty 1

## 2015-12-30 MED ORDER — FINASTERIDE 5 MG PO TABS: 5.0000 mg | ORAL_TABLET | Freq: Every day | ORAL | Status: DC

## 2015-12-30 MED ORDER — MECLIZINE HCL 25 MG PO TABS
25.0000 mg | ORAL_TABLET | Freq: Three times a day (TID) | ORAL | Status: DC | PRN
  Filled 2015-12-30: qty 1

## 2015-12-30 NOTE — Evaluation (Signed)
Physical Therapy Evaluation Patient Details Name: Trevor [name] MRN: [id number] DOB: Jun 24, 1946 Today's Date: 12/30/2015   History of Present Illness  70 y.o. male admitted for generalized weakness beginning after dialysis on 12/24/15 and also had subjective fevers and a productive cough. 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  Clinical Impression  Patient presents with problems listed below.  Will benefit from acute PT to maximize functional mobility prior to discharge.  Do not feel patient is able to care for self safely at home alone most of the day.  Recommend [name] at discharge for continued therapy.    Follow Up Recommendations [name];Supervision/Assistance - 24 hour    Equipment Recommendations  None recommended by PT    Recommendations for Other Services       Precautions / Restrictions Precautions Precautions: Fall Precaution Comments: Fall onto Rt hand.  Patient also reports it is taking him longer to "recover" after HD.  Patient blind in Rt eye and sees only shapes in Lt eye. Restrictions Weight Bearing Restrictions: No      Mobility  Bed Mobility Overal bed mobility: Needs Assistance Bed Mobility: Supine to Sit;Sit to Supine     Supine to sit: Min guard;HOB elevated Sit to supine: Min assist   General bed mobility comments: Required incerased time to move to sitting.  Assist for safety.  Patient able to maintain sitting balance.  Patient reports feeling lightheaded in sitting, requiring several minutes to improve. To return to supine, required assist to bring LE's onto bed.  Transfers Overall transfer level: Needs assistance Equipment used: Rolling walker (2 wheeled) Transfers: Sit to/from Stand Sit to Stand: Min assist;From elevated surface         General transfer comment: Min assist to power up to standing and for balance.  Patient with increased  lightheadedness in stance, with no improvement with time.  Able to sidestep 2 steps toward [facility] and return to sitting > supine.  Ambulation/Gait             General Gait Details: Unable due to lightheadedness.  Stairs            Wheelchair Mobility    Modified Rankin (Stroke Patients Only)       Balance   Sitting-balance support: No upper extremity supported;Feet supported Sitting balance-Leahy Scale: Fair     Standing balance support: Bilateral upper extremity supported Standing balance-Leahy Scale: Poor                               Pertinent Vitals/Pain Pain Assessment: 0-10 Pain Score: 3  Pain Location: Rt hand and low back Pain Descriptors / Indicators: Aching;Sore Pain Intervention(s): Monitored during session    Home Living Family/patient expects to be discharged to:: Private residence Living Arrangements: Alone Available Help at Discharge: Personal care attendant;Available PRN/intermittently (PCA 5x/week helps with cleaning) Type of Home: House Home Access: Ramped entrance     Home Layout: One level Home Equipment: Walker - 2 wheels;Cane - single point;Bedside commode;Wheelchair - manual Additional Comments: Uses [facility] to go to HD 3x/week.    Prior Function Level of Independence: Needs assistance   Gait / Transfers Assistance Needed: Uses combination of RW and w/c for mobility.  Will use cane in house.  ADL's / Homemaking Assistance Needed: Independent with bathing/dressing.  PCA prepares some meals and does housework.  Has Mobile Meals        Hand Dominance   Dominant Hand: Right    Extremity/Trunk Assessment   Upper Extremity Assessment: Generalized weakness           Lower Extremity Assessment: Generalized weakness      Cervical / Trunk Assessment: Normal  Communication   Communication: No difficulties  Cognition Arousal/Alertness: Awake/alert Behavior During Therapy: Flat affect Overall Cognitive Status:  Within Functional Limits for tasks assessed                      General Comments      Exercises        Assessment/Plan    PT Assessment Patient needs continued PT services  PT Diagnosis Difficulty walking;Generalized weakness;Acute pain   PT Problem List Decreased strength;Decreased activity tolerance;Decreased balance;Decreased mobility;Decreased knowledge of use of DME;Cardiopulmonary status limiting activity;Pain  PT Treatment Interventions DME instruction;Gait training;Functional mobility training;Therapeutic activities;Therapeutic exercise;Patient/family education   PT Goals (Current goals can be found in the Care Plan section) Acute Rehab PT Goals Patient Stated Goal: to get stronger before I go home PT Goal Formulation: With patient Time For Goal Achievement: 01/06/16 Potential to Achieve Goals: Good    Frequency Min 3X/week   Barriers to discharge Decreased caregiver support Patient lives alone    Co-evaluation               End of Session Equipment Utilized During Treatment: Gait belt Activity Tolerance: Patient limited by fatigue;Treatment limited secondary to medical complications (Comment) (Lightheaded with sitting/standing) Patient left: in bed;with call bell/phone within reach           Time: 1346-1403 PT Time Calculation (min) (ACUTE ONLY): 17 min   Charges:   PT Evaluation $PT Eval Moderate Complexity: 1 Procedure     PT G CodesDespina Pole Jan 20, 2016, 4:40 PM Carita Pian. Sanjuana Kava, [place] Pager [phone]

## 2015-12-30 NOTE — Progress Notes (Addendum)
[facility] PROGRESS NOTE  Trevor [name] [id number] DOB: [birth year] DOA: 12/26/2015 PCP: [name], MD  Length of stay: 4   Assessment/Plan: Active Problems:   Nonischemic cardiomyopathy ([place])   History of stroke   Chronic combined systolic and diastolic CHF (congestive heart failure) ([place])   Coronary artery disease, non-occlusive: Cath 2013 - Moderate D1 & RCA disease   ESRD on hemodialysis ([place])   Protein-calorie malnutrition, severe (H[facility])   Generalized weakness   Weakness generalized   Cough     Brief summary 70 y.o. male ESRD dialyzing at [facility] with Dr. Marval Regal with a EDW of 62kg and presenting with generalized weakness which started after dialysis on Monday but worsened today. He also states that he has had subjective fevers and a productive cough. He is blind in the right eye and sees some shapes through the left eye; therefore was not able to see what he was coughing up. He also has had intermittent headaches which he states is new. He missed dialysis today because he was feeling weak; he is weaker on the right side but is able to ambulate with a walker + uses a wheelchair as well. He denies any nausea, vomiting, diarrhea, abdominal pain, sore throat or rhinorrhea. He also denies chest pain or palpitations. He admits to not taking his medications for a whole month and then restarted taking them on 1/24 after a home health nurse visited [facility].  Assessment and plan Generalized weakness, patient unable to stand, very orthostatic last night    has some symptoms of a URI, cough, low-grade fever, chest x-ray negative, no signs of infection Blood culture no growth so far times 2, flu PCR negative Lactic acid, ammonia, TSH free T4 within normal limits -It is possible that resuming all his home medicines yesterday after a long gap could be potentially contributing, - no focal neurological symptoms, no meningeal signs  PT/OT evaluation pending reduce  blood  pressure medications, received a total of 3 L of normal saline with hemodialysis Patient has resting hypertension, [id number] to [id number] systolic Orthostatics positive last night  MRI MRA of the brain to rule out CVA, vertebrobasilar insufficiency, cervical spinal stenosis? 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 (cont ASA, plavix ) Start midodrine for orthostatic Hypotension, DC flomax, proscar >18% incidence of orthostasis   Nonischemic cardiomyopathy, combined chronic systolic diastolic On  0000000 EF was 55-60%, repeat 2-D echo on 1/27 shows EF of 45-50% -History of non-ST elevation MI 08/17/15 that showed an EF of 19%, He was advised to continue with aspirin Coreg and Plavix, statin, Currently Coreg is on hold,    Hypertension-resume Coreg 12.5 mg BID , continue hydralazine  ESRD on HD Monday Wednesday Friday Nephrology following    History of stroke - exam is nonfocal, CT unremarkable,MRI old CVA  Continue ASA and  Plavix    Chronic combined systolic and diastolic CHF (congestive heart failure)  -Volume management HD, continue Coreg    Protein-calorie malnutrition, severe ([place]) -RD consult   DVT prophylaxsis heparin  Code Status:      Code Status Orders        Start     Ordered   12/26/15 2046  Full code   Continuous     12/26/15 2045      Family Communication: Discussed in detail with the patient, all imaging results, lab results explained to the patient   Disposition Plan:  PT OT evaluation, pending  Consultants:  Nephrology  Procedures:  None  Antibiotics: Anti-infectives    None         HPI/Subjective: Still dizzy. No n/v/d, abd pain or SOB denies any worsening shortness of breath although the patient is not very active at baseline  Objective: Filed Vitals:   12/29/15 1941 12/29/15 2000 12/29/15 2348 12/30/15 0549  BP:  189/71  175/75  Pulse:  82 85 81  Temp:  98.5 F (36.9 C) 98 F (36.7 C) 98.9 F (37.2 C)   TempSrc:  Oral Oral Oral  Resp:  16 16 18   Height: 5\' 10"  (1.778 m)     Weight: 63.776 kg (140 lb 9.6 oz)     SpO2:  100%  97%    Intake/Output Summary (Last 24 hours) at 12/30/15 1246 Last data filed at 12/30/15 0800  Gross per 24 hour  Intake    360 ml  Output      0 ml  Net    360 ml    Exam:  General: No acute respiratory distress Lungs: Clear to auscultation bilaterally without wheezes or crackles Cardiovascular: Regular rate and rhythm without murmur gallop or rub normal S1 and S2 Abdomen: Nontender, nondistended, soft, bowel sounds positive, no rebound, no ascites, no appreciable mass Extremities: No significant cyanosis, clubbing, or edema bilateral lower extremities     Data Review   Micro Results Recent Results (from the past 240 hour(s))  Urine culture     Status: None   Collection Time: 12/26/15  5:38 PM  Result Value Ref Range Status   Specimen Description URINE, CLEAN CATCH  Final   Special Requests NONE  Final   Culture MULTIPLE SPECIES PRESENT, SUGGEST RECOLLECTION  Final   Report Status 12/28/2015 FINAL  Final  Culture, blood (Routine X 2) w Reflex to ID Panel     Status: None (Preliminary result)   Collection Time: 12/27/15  1:30 PM  Result Value Ref Range Status   Specimen Description HEMODIALYSIS LINE  Final   Special Requests BOTTLES DRAWN AEROBIC AND ANAEROBIC 10MLS  Final   Culture NO GROWTH 2 DAYS  Final   Report Status PENDING  Incomplete  Culture, blood (Routine X 2) w Reflex to ID Panel     Status: None (Preliminary result)   Collection Time: 12/27/15  1:40 PM  Result Value Ref Range Status   Specimen Description HEMODIALYSIS LINE  Final   Special Requests BOTTLES DRAWN AEROBIC AND ANAEROBIC 10MLS  Final   Culture NO GROWTH 2 DAYS  Final   Report Status PENDING  Incomplete    Radiology Reports Dg Chest 2 View  12/26/2015  CLINICAL DATA:  Generalized weakness EXAM: CHEST  2 VIEW COMPARISON:  08/12/2015 FINDINGS: Cardiomegaly. Normal  vascularity. New oval density over the right upper lung zone may reflect an overlying object. Minimal basilar hypoaeration change. IMPRESSION: Right upper lobe nodular density may reflect an overlying object. Repeat after removal of overlying objects is recommended. Cardiomegaly without edema. Electronically Signed   By: Marybelle Killings M.D.   On: 12/26/2015 14:04   Ct Head Wo Contrast  12/26/2015  CLINICAL DATA:  Generalized weakness EXAM: CT HEAD WITHOUT CONTRAST TECHNIQUE: Contiguous axial images were obtained from the base of the skull through the vertex without intravenous contrast. COMPARISON:  04/04/2015.  04/03/2015. FINDINGS: Encephalomalacia in the left frontal and right occipital lobes is stable. Chronic ischemic changes in the periventricular white matter. No mass effect, midline shift, or acute hemorrhage. Mastoid air cells are clear. Visualized  paranasal sinuses are clear. IMPRESSION: Chronic ischemic changes.  No acute intracranial pathology. Electronically Signed   By: Marybelle Killings M.D.   On: 12/26/2015 13:28   Mr Jodene Nam Head Wo Contrast  12/29/2015  CLINICAL DATA:  Development of dizziness in weakness beginning 12/26/2015. EXAM: MRI HEAD WITHOUT CONTRAST MRA HEAD WITHOUT CONTRAST MRA NECK WITHOUT CONTRAST TECHNIQUE: Multiplanar, multiecho pulse sequences of the brain and surrounding structures were obtained without intravenous contrast. Angiographic images of the Circle of Willis were obtained using MRA technique without intravenous contrast. Angiographic images of the neck were obtained using MRA technique without intravenous contrast. Carotid stenosis measurements (when applicable) are obtained utilizing NASCET criteria, using the distal internal carotid diameter as the denominator. COMPARISON:  Head CT 12/26/2015 FINDINGS: MRI HEAD FINDINGS Diffusion imaging does not show any acute or subacute infarction. There chronic small-vessel ischemic changes throughout the pons. No focal cerebellar insult.  Cerebral hemispheres show old infarction in the right PCA territory affecting the posterior medial temporal lobe and parietal lobe. There is an old left frontal cortical and subcortical infarction. Chronic small-vessel ischemic changes are present elsewhere throughout the cerebral hemispheric white matter. No mass lesion, recent hemorrhage, hydrocephalus or extra-axial collection. No pituitary mass. No inflammatory sinus disease. No skull or skullbase lesion. MRA HEAD FINDINGS Both internal carotid arteries are patent into the brain, but there is atherosclerotic narrowing and irregularity in both carotid siphon regions. The right internal carotid artery shows supraclinoid stenosis. This vessel supplies the right middle cerebral artery territory and a diseased A1 segment on the right. The left internal carotid artery supplies the left middle cerebral artery territory gives the majority of the supply to both anterior cerebral artery territories. There is some atherosclerotic narrowing and irregularity in the more distal branch vessels bilaterally. Both vertebral arteries are patent with the right being dominant. The left vertebral artery shows atherosclerotic narrowing and irregularity at the foramen magnum and in the distal vertebral artery. There are serial stenoses estimated at 50%. The basilar artery shows atherosclerotic narrowing and irregularity with maximal stenosis measuring 30%. Superior cerebellar and posterior cerebral vessels are patent, with atherosclerotic irregularity of the more distal PCA branches. MRA NECK FINDINGS Both common carotid arteries are widely patent to the bifurcation regions. Both carotid bifurcations are patent with mild atherosclerotic change. Narrowing in the right ICA bulb is estimated at 25%. There is no stenosis on the left. Both vertebral arteries are patent through the cervical region without stenosis. Proximal vessels/intra thoracic segments are not evaluated given the  noncontrast technique. IMPRESSION: MRI head: No acute insult. 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 Extensive chronic small vessel disease. MRA neck: No significant carotid bifurcation disease. Antegrade flow in both vertebral arteries. MRA head: No major vessel occlusion at this time. Extensive atherosclerotic narrowing and irregularity in both carotid siphon regions. Intracranial atherosclerotic disease diffusely affecting the more distal branch vessels as well as the A1 segment on the right. There is considerable atherosclerotic disease of the distal left vertebral artery with serial 50% stenoses. Electronically Signed   By: Nelson Chimes M.D.   On: 12/29/2015 13:40   Mr Angiogram Neck Wo Contrast  12/29/2015  CLINICAL DATA:  Development of dizziness in weakness beginning 12/26/2015. EXAM: MRI HEAD WITHOUT CONTRAST MRA HEAD WITHOUT CONTRAST MRA NECK WITHOUT CONTRAST TECHNIQUE: Multiplanar, multiecho pulse sequences of the brain and surrounding structures were obtained without intravenous contrast. Angiographic images of the Circle of Willis were obtained using MRA technique without intravenous contrast. Angiographic images  of the neck were obtained using MRA technique without intravenous contrast. Carotid stenosis measurements (when applicable) are obtained utilizing NASCET criteria, using the distal internal carotid diameter as the denominator. COMPARISON:  Head CT 12/26/2015 FINDINGS: MRI HEAD FINDINGS Diffusion imaging does not show any acute or subacute infarction. There chronic small-vessel ischemic changes throughout the pons. No focal cerebellar insult. Cerebral hemispheres show old infarction in the right PCA territory affecting the posterior medial temporal lobe and parietal lobe. There is an old left frontal cortical and subcortical infarction. Chronic small-vessel ischemic changes are present elsewhere throughout the cerebral hemispheric white matter. No mass lesion, recent  hemorrhage, hydrocephalus or extra-axial collection. No pituitary mass. No inflammatory sinus disease. No skull or skullbase lesion. MRA HEAD FINDINGS Both internal carotid arteries are patent into the brain, but there is atherosclerotic narrowing and irregularity in both carotid siphon regions. The right internal carotid artery shows supraclinoid stenosis. This vessel supplies the right middle cerebral artery territory and a diseased A1 segment on the right. The left internal carotid artery supplies the left middle cerebral artery territory gives the majority of the supply to both anterior cerebral artery territories. There is some atherosclerotic narrowing and irregularity in the more distal branch vessels bilaterally. Both vertebral arteries are patent with the right being dominant. The left vertebral artery shows atherosclerotic narrowing and irregularity at the foramen magnum and in the distal vertebral artery. There are serial stenoses estimated at 50%. The basilar artery shows atherosclerotic narrowing and irregularity with maximal stenosis measuring 30%. Superior cerebellar and posterior cerebral vessels are patent, with atherosclerotic irregularity of the more distal PCA branches. MRA NECK FINDINGS Both common carotid arteries are widely patent to the bifurcation regions. Both carotid bifurcations are patent with mild atherosclerotic change. Narrowing in the right ICA bulb is estimated at 25%. There is no stenosis on the left. Both vertebral arteries are patent through the cervical region without stenosis. Proximal vessels/intra thoracic segments are not evaluated given the noncontrast technique. IMPRESSION: MRI head: No acute insult. 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 Extensive chronic small vessel disease. MRA neck: No significant carotid bifurcation disease. Antegrade flow in both vertebral arteries. MRA head: No major vessel occlusion at this time. Extensive atherosclerotic  narrowing and irregularity in both carotid siphon regions. Intracranial atherosclerotic disease diffusely affecting the more distal branch vessels as well as the A1 segment on the right. There is considerable atherosclerotic disease of the distal left vertebral artery with serial 50% stenoses. Electronically Signed   By: Nelson Chimes M.D.   On: 12/29/2015 13:40   Mr Brain Wo Contrast  12/29/2015  CLINICAL DATA:  Development of dizziness in weakness beginning 12/26/2015. EXAM: MRI HEAD WITHOUT CONTRAST MRA HEAD WITHOUT CONTRAST MRA NECK WITHOUT CONTRAST TECHNIQUE: Multiplanar, multiecho pulse sequences of the brain and surrounding structures were obtained without intravenous contrast. Angiographic images of the Circle of Willis were obtained using MRA technique without intravenous contrast. Angiographic images of the neck were obtained using MRA technique without intravenous contrast. Carotid stenosis measurements (when applicable) are obtained utilizing NASCET criteria, using the distal internal carotid diameter as the denominator. COMPARISON:  Head CT 12/26/2015 FINDINGS: MRI HEAD FINDINGS Diffusion imaging does not show any acute or subacute infarction. There chronic small-vessel ischemic changes throughout the pons. No focal cerebellar insult. Cerebral hemispheres show old infarction in the right PCA territory affecting the posterior medial temporal lobe and parietal lobe. There is an old left frontal cortical and subcortical infarction. Chronic small-vessel ischemic changes  are present elsewhere throughout the cerebral hemispheric white matter. No mass lesion, recent hemorrhage, hydrocephalus or extra-axial collection. No pituitary mass. No inflammatory sinus disease. No skull or skullbase lesion. MRA HEAD FINDINGS Both internal carotid arteries are patent into the brain, but there is atherosclerotic narrowing and irregularity in both carotid siphon regions. The right internal carotid artery shows  supraclinoid stenosis. This vessel supplies the right middle cerebral artery territory and a diseased A1 segment on the right. The left internal carotid artery supplies the left middle cerebral artery territory gives the majority of the supply to both anterior cerebral artery territories. There is some atherosclerotic narrowing and irregularity in the more distal branch vessels bilaterally. Both vertebral arteries are patent with the right being dominant. The left vertebral artery shows atherosclerotic narrowing and irregularity at the foramen magnum and in the distal vertebral artery. There are serial stenoses estimated at 50%. The basilar artery shows atherosclerotic narrowing and irregularity with maximal stenosis measuring 30%. Superior cerebellar and posterior cerebral vessels are patent, with atherosclerotic irregularity of the more distal PCA branches. MRA NECK FINDINGS Both common carotid arteries are widely patent to the bifurcation regions. Both carotid bifurcations are patent with mild atherosclerotic change. Narrowing in the right ICA bulb is estimated at 25%. There is no stenosis on the left. Both vertebral arteries are patent through the cervical region without stenosis. Proximal vessels/intra thoracic segments are not evaluated given the noncontrast technique. IMPRESSION: MRI head: No acute insult. 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 Extensive chronic small vessel disease. MRA neck: No significant carotid bifurcation disease. Antegrade flow in both vertebral arteries. MRA head: No major vessel occlusion at this time. Extensive atherosclerotic narrowing and irregularity in both carotid siphon regions. Intracranial atherosclerotic disease diffusely affecting the more distal branch vessels as well as the A1 segment on the right. There is considerable atherosclerotic disease of the distal left vertebral artery with serial 50% stenoses. Electronically Signed   By: Nelson Chimes M.D.    On: 12/29/2015 13:40   Dg Chest Port 1 View  12/27/2015  CLINICAL DATA:  Productive cough for 2 days, weakness - hx of MI, stroke, htn, SOB EXAM: PORTABLE CHEST 1 VIEW COMPARISON:  12/25/2013 FINDINGS: The heart size and mediastinal contours are within normal limits. Both lungs are clear. The visualized skeletal structures are unremarkable. IMPRESSION: No active disease. Electronically Signed   By: Nolon Nations M.D.   On: 12/27/2015 12:21     CBC  Recent Labs Lab 12/26/15 1135 12/27/15 0505 12/28/15 0350  WBC 4.5 5.2 4.0  HGB 12.8* 10.8* 9.8*  HCT 38.7* 32.7* 30.1*  PLT 168 159 131*  MCV 90.2 90.1 90.4  MCH 29.8 29.8 29.4  MCHC 33.1 33.0 32.6  RDW 17.4* 17.2* 16.9*    Chemistries   Recent Labs Lab 12/26/15 1135 12/27/15 0505 12/28/15 0350  NA 140 140 140  K 4.5 4.9 4.1  CL 100* 100* 101  CO2 25 25 29   GLUCOSE 148* 81 79  BUN 34* 47* 21*  CREATININE 9.33* 10.66* 6.23*  CALCIUM 9.6 8.8* 8.7*  AST  --  9* 11*  ALT  --  8* 6*  ALKPHOS  --  74 66  BILITOT  --  0.7 0.5   ------------------------------------------------------------------------------------------------------------------ estimated creatinine clearance is 10 mL/min (by C-G formula based on Cr of 6.23). ------------------------------------------------------------------------------------------------------------------ No results for input(s): HGBA1C in the last 72 hours. ------------------------------------------------------------------------------------------------------------------ No results for input(s): CHOL, HDL, LDLCALC, TRIG, CHOLHDL, LDLDIRECT in the last  72 hours. ------------------------------------------------------------------------------------------------------------------ No results for input(s): TSH, T4TOTAL, T3FREE, THYROIDAB in the last 72 hours.  Invalid input(s): FREET3 ------------------------------------------------------------------------------------------------------------------ No  results for input(s): VITAMINB12, FOLATE, FERRITIN, TIBC, IRON, RETICCTPCT in the last 72 hours.  Coagulation profile No results for input(s): INR, PROTIME in the last 168 hours.  No results for input(s): DDIMER in the last 72 hours.  Cardiac Enzymes No results for input(s): CKMB, TROPONINI, MYOGLOBIN in the last 168 hours.  Invalid input(s): CK ------------------------------------------------------------------------------------------------------------------ Invalid input(s): POCBNP   CBG: No results for input(s): GLUCAP in the last 168 hours.     Studies: Mr Virgel Paling [id number] Contrast  12/29/2015  CLINICAL DATA:  Development of dizziness in weakness beginning 12/26/2015. EXAM: MRI HEAD WITHOUT CONTRAST MRA HEAD WITHOUT CONTRAST MRA NECK WITHOUT CONTRAST TECHNIQUE: Multiplanar, multiecho pulse sequences of the brain and surrounding structures were obtained without intravenous contrast. Angiographic images of the Circle of Willis were obtained using MRA technique without intravenous contrast. Angiographic images of the neck were obtained using MRA technique without intravenous contrast. Carotid stenosis measurements (when applicable) are obtained utilizing NASCET criteria, using the distal internal carotid diameter as the denominator. COMPARISON:  Head CT 12/26/2015 FINDINGS: MRI HEAD FINDINGS Diffusion imaging does not show any acute or subacute infarction. There chronic small-vessel ischemic changes throughout the pons. No focal cerebellar insult. Cerebral hemispheres show old infarction in the right PCA territory affecting the posterior medial temporal lobe and parietal lobe. There is an old left frontal cortical and subcortical infarction. Chronic small-vessel ischemic changes are present elsewhere throughout the cerebral hemispheric white matter. No mass lesion, recent hemorrhage, hydrocephalus or extra-axial collection. No pituitary mass. No inflammatory sinus disease. No skull or skullbase  lesion. MRA HEAD FINDINGS Both internal carotid arteries are patent into the brain, but there is atherosclerotic narrowing and irregularity in both carotid siphon regions. The right internal carotid artery shows supraclinoid stenosis. This vessel supplies the right middle cerebral artery territory and a diseased A1 segment on the right. The left internal carotid artery supplies the left middle cerebral artery territory gives the majority of the supply to both anterior cerebral artery territories. There is some atherosclerotic narrowing and irregularity in the more distal branch vessels bilaterally. Both vertebral arteries are patent with the right being dominant. The left vertebral artery shows atherosclerotic narrowing and irregularity at the foramen magnum and in the distal vertebral artery. There are serial stenoses estimated at 50%. The basilar artery shows atherosclerotic narrowing and irregularity with maximal stenosis measuring 30%. Superior cerebellar and posterior cerebral vessels are patent, with atherosclerotic irregularity of the more distal PCA branches. MRA NECK FINDINGS Both common carotid arteries are widely patent to the bifurcation regions. Both carotid bifurcations are patent with mild atherosclerotic change. Narrowing in the right ICA bulb is estimated at 25%. There is no stenosis on the left. Both vertebral arteries are patent through the cervical region without stenosis. Proximal vessels/intra thoracic segments are not evaluated given the noncontrast technique. IMPRESSION: MRI head: No acute insult. 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 Extensive chronic small vessel disease. MRA neck: No significant carotid bifurcation disease. Antegrade flow in both vertebral arteries. MRA head: No major vessel occlusion at this time. Extensive atherosclerotic narrowing and irregularity in both carotid siphon regions. Intracranial atherosclerotic disease diffusely affecting the more distal  branch vessels as well as the A1 segment on the right. There is considerable atherosclerotic disease of the distal left vertebral artery with serial 50% stenoses. Electronically Signed   By: Elta [place]  Shogry M.D.   On: 12/29/2015 13:40   Mr Angiogram Neck Wo Contrast  12/29/2015  CLINICAL DATA:  Development of dizziness in weakness beginning 12/26/2015. EXAM: MRI HEAD WITHOUT CONTRAST MRA HEAD WITHOUT CONTRAST MRA NECK WITHOUT CONTRAST TECHNIQUE: Multiplanar, multiecho pulse sequences of the brain and surrounding structures were obtained without intravenous contrast. Angiographic images of the Circle of Willis were obtained using MRA technique without intravenous contrast. Angiographic images of the neck were obtained using MRA technique without intravenous contrast. Carotid stenosis measurements (when applicable) are obtained utilizing NASCET criteria, using the distal internal carotid diameter as the denominator. COMPARISON:  Head CT 12/26/2015 FINDINGS: MRI HEAD FINDINGS Diffusion imaging does not show any acute or subacute infarction. There chronic small-vessel ischemic changes throughout the pons. No focal cerebellar insult. Cerebral hemispheres show old infarction in the right PCA territory affecting the posterior medial temporal lobe and parietal lobe. There is an old left frontal cortical and subcortical infarction. Chronic small-vessel ischemic changes are present elsewhere throughout the cerebral hemispheric white matter. No mass lesion, recent hemorrhage, hydrocephalus or extra-axial collection. No pituitary mass. No inflammatory sinus disease. No skull or skullbase lesion. MRA HEAD FINDINGS Both internal carotid arteries are patent into the brain, but there is atherosclerotic narrowing and irregularity in both carotid siphon regions. The right internal carotid artery shows supraclinoid stenosis. This vessel supplies the right middle cerebral artery territory and a diseased A1 segment on the right. The  left internal carotid artery supplies the left middle cerebral artery territory gives the majority of the supply to both anterior cerebral artery territories. There is some atherosclerotic narrowing and irregularity in the more distal branch vessels bilaterally. Both vertebral arteries are patent with the right being dominant. The left vertebral artery shows atherosclerotic narrowing and irregularity at the foramen magnum and in the distal vertebral artery. There are serial stenoses estimated at 50%. The basilar artery shows atherosclerotic narrowing and irregularity with maximal stenosis measuring 30%. Superior cerebellar and posterior cerebral vessels are patent, with atherosclerotic irregularity of the more distal PCA branches. MRA NECK FINDINGS Both common carotid arteries are widely patent to the bifurcation regions. Both carotid bifurcations are patent with mild atherosclerotic change. Narrowing in the right ICA bulb is estimated at 25%. There is no stenosis on the left. Both vertebral arteries are patent through the cervical region without stenosis. Proximal vessels/intra thoracic segments are not evaluated given the noncontrast technique. IMPRESSION: MRI head: No acute insult. 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 Extensive chronic small vessel disease. MRA neck: No significant carotid bifurcation disease. Antegrade flow in both vertebral arteries. MRA head: No major vessel occlusion at this time. Extensive atherosclerotic narrowing and irregularity in both carotid siphon regions. Intracranial atherosclerotic disease diffusely affecting the more distal branch vessels as well as the A1 segment on the right. There is considerable atherosclerotic disease of the distal left vertebral artery with serial 50% stenoses. Electronically Signed   By: Nelson Chimes M.D.   On: 12/29/2015 13:40   Mr Brain Wo Contrast  12/29/2015  CLINICAL DATA:  Development of dizziness in weakness beginning  12/26/2015. EXAM: MRI HEAD WITHOUT CONTRAST MRA HEAD WITHOUT CONTRAST MRA NECK WITHOUT CONTRAST TECHNIQUE: Multiplanar, multiecho pulse sequences of the brain and surrounding structures were obtained without intravenous contrast. Angiographic images of the Circle of Willis were obtained using MRA technique without intravenous contrast. Angiographic images of the neck were obtained using MRA technique without intravenous contrast. Carotid stenosis measurements (when applicable) are obtained utilizing NASCET criteria,  using the distal internal carotid diameter as the denominator. COMPARISON:  Head CT 12/26/2015 FINDINGS: MRI HEAD FINDINGS Diffusion imaging does not show any acute or subacute infarction. There chronic small-vessel ischemic changes throughout the pons. No focal cerebellar insult. Cerebral hemispheres show old infarction in the right PCA territory affecting the posterior medial temporal lobe and parietal lobe. There is an old left frontal cortical and subcortical infarction. Chronic small-vessel ischemic changes are present elsewhere throughout the cerebral hemispheric white matter. No mass lesion, recent hemorrhage, hydrocephalus or extra-axial collection. No pituitary mass. No inflammatory sinus disease. No skull or skullbase lesion. MRA HEAD FINDINGS Both internal carotid arteries are patent into the brain, but there is atherosclerotic narrowing and irregularity in both carotid siphon regions. The right internal carotid artery shows supraclinoid stenosis. This vessel supplies the right middle cerebral artery territory and a diseased A1 segment on the right. The left internal carotid artery supplies the left middle cerebral artery territory gives the majority of the supply to both anterior cerebral artery territories. There is some atherosclerotic narrowing and irregularity in the more distal branch vessels bilaterally. Both vertebral arteries are patent with the right being dominant. The left  vertebral artery shows atherosclerotic narrowing and irregularity at the foramen magnum and in the distal vertebral artery. There are serial stenoses estimated at 50%. The basilar artery shows atherosclerotic narrowing and irregularity with maximal stenosis measuring 30%. Superior cerebellar and posterior cerebral vessels are patent, with atherosclerotic irregularity of the more distal PCA branches. MRA NECK FINDINGS Both common carotid arteries are widely patent to the bifurcation regions. Both carotid bifurcations are patent with mild atherosclerotic change. Narrowing in the right ICA bulb is estimated at 25%. There is no stenosis on the left. Both vertebral arteries are patent through the cervical region without stenosis. Proximal vessels/intra thoracic segments are not evaluated given the noncontrast technique. IMPRESSION: MRI head: No acute insult. 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 Extensive chronic small vessel disease. MRA neck: No significant carotid bifurcation disease. Antegrade flow in both vertebral arteries. MRA head: No major vessel occlusion at this time. Extensive atherosclerotic narrowing and irregularity in both carotid siphon regions. Intracranial atherosclerotic disease diffusely affecting the more distal branch vessels as well as the A1 segment on the right. There is considerable atherosclerotic disease of the distal left vertebral artery with serial 50% stenoses. Electronically Signed   By: Nelson Chimes M.D.   On: 12/29/2015 13:40      Lab Results  Component Value Date   HGBA1C 4.9 04/04/2015   HGBA1C 5.2 07/07/2014   HGBA1C 5.4 10/08/2011   Lab Results  Component Value Date   LDLCALC 35 08/17/2015   CREATININE 6.23* 12/28/2015       Scheduled Meds: . aspirin  81 mg Oral Daily  . atorvastatin  40 mg Oral q1800  . calcium acetate  667 mg Oral TID WC  . carvedilol  12.5 mg Oral BID WC  . cloNIDine  0.1 mg Oral BID  . clopidogrel  75 mg Oral Daily   . finasteride  5 mg Oral Daily  . finasteride  5 mg Oral Daily  . gabapentin  100 mg Oral QHS  . heparin  5,000 Units Subcutaneous 3 times per day  . hydrALAZINE  50 mg Oral TID  . midodrine  5 mg Oral TID WC  . multivitamin  1 tablet Oral QHS  . pantoprazole  40 mg Oral Daily   Continuous Infusions:   Active Problems:  Nonischemic cardiomyopathy ([place])   History of stroke   Chronic combined systolic and diastolic CHF (congestive heart failure) ([place])   Coronary artery disease, non-occlusive: Cath 2013 - Moderate D1 & RCA disease   ESRD on hemodialysis (HCC)   Protein-calorie malnutrition, severe (HCC)   Generalized weakness   Weakness generalized   Cough    Time spent: 45 minutes   [facility] Pager [phone]. If 7PM-7AM, please contact night-coverage at [email or web address], password [facility] 12/30/2015, 12:46 PM  LOS: 4 days

## 2015-12-30 NOTE — Progress Notes (Signed)
  [name] Progress Note   Subjective: standing BP's are all over A999333 systolic, but patient still feels "bad". He feels bad lying ,sitting or standing but the worst with standing.  Given oxygen but his RA SaO2 is 98-100% on room air. Just got pCXR and it is entirely clear. ECHO showed LV 45% otherwise nothing abnormal.  Trop and EKG on admission were unremarkable.  MRI/ MRA of brain showed no lesions and no sig arterial disease.   Filed Vitals:   12/29/15 2000 12/29/15 2348 12/30/15 0549 12/30/15 1426  BP: 189/71  175/75   Pulse: 82 85 81 66  Temp: 98.5 F (36.9 C) 98 F (36.7 C) 98.9 F (37.2 C) 98 F (36.7 C)  TempSrc: Oral Oral Oral Oral  Resp: 16 16 18 18   Height:      Weight:      SpO2: 100%  97% 99%    Inpatient medications: . aspirin  81 mg Oral Daily  . atorvastatin  40 mg Oral q1800  . calcium acetate  667 mg Oral TID WC  . carvedilol  12.5 mg Oral BID WC  . cloNIDine  0.1 mg Oral BID  . clopidogrel  75 mg Oral Daily  . gabapentin  100 mg Oral QHS  . heparin  5,000 Units Subcutaneous 3 times per day  . hydrALAZINE  50 mg Oral TID  . midodrine  5 mg Oral TID WC  . multivitamin  1 tablet Oral QHS  . 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 Gave 3L NS and didn't improve his symptoms at all.  EKG/  trop's neg on admission.  MRI/ MRA brain unremarkable. Not sure cause of his problems. ?vestibular 2 ESRD MWF 3 HTN resumed all 3 bp meds 4 Anemia stable 5 MBD cont meds  Plan - HD tomorrow. UF to dry wt 62kg. Have d/w primary MD.    Kelly Splinter MD [facility] pager [phone]    cell [phone] 12/30/2015, 3:18 PM    Recent Labs Lab 12/26/15 1135 12/27/15 0505 12/28/15 0350  NA 140 140 140  K 4.5 4.9 4.1  CL 100* 100* 101  CO2 25 25 29   GLUCOSE 148* 81 79  BUN 34* 47* 21*  CREATININE 9.33* 10.66* 6.23*  CALCIUM 9.6 8.8* 8.7*    Recent Labs Lab 12/27/15 0505 12/28/15 0350  AST 9* 11*  ALT 8* 6*  ALKPHOS 74 66  BILITOT 0.7 0.5  PROT 6.0* 5.6*  ALBUMIN 2.8* 2.5*    Recent Labs Lab 12/26/15 1135 12/27/15 0505 12/28/15 0350  WBC 4.5 5.2 4.0  HGB 12.8* 10.8* 9.8*  HCT 38.7* 32.7* 30.1*  MCV 90.2 90.1 90.4  PLT 168 159 131*

## 2015-12-31 LAB — BASIC METABOLIC PANEL
Anion gap: 12 (ref 5–15)
BUN: 38 mg/dL — AB (ref 6–20)
CALCIUM: 9.3 mg/dL (ref 8.9–10.3)
CHLORIDE: 103 mmol/L (ref 101–111)
CO2: 24 mmol/L (ref 22–32)
CREATININE: 9.36 mg/dL — AB (ref 0.61–1.24)
GFR calc non Af Amer: 5 mL/min — ABNORMAL LOW (ref >60)
GFR, EST AFRICAN AMERICAN: 6 mL/min — AB (ref >60)
GLUCOSE: 84 mg/dL (ref 65–99)
Potassium: 4.8 mmol/L (ref 3.5–5.1)
Sodium: 139 mmol/L (ref 135–145)

## 2015-12-31 LAB — CBC
HEMATOCRIT: 28.1 % — AB (ref 39.0–52.0)
HEMOGLOBIN: 9.7 g/dL — AB (ref 13.0–17.0)
MCH: 31.2 pg (ref 26.0–34.0)
MCHC: 34.5 g/dL (ref 30.0–36.0)
MCV: 90.4 fL (ref 78.0–100.0)
Platelets: 123 10*3/uL — ABNORMAL LOW (ref 150–400)
RBC: 3.11 MIL/uL — AB (ref 4.22–5.81)
RDW: 16.6 % — AB (ref 11.5–15.5)
WBC: 4 10*3/uL (ref 4.0–10.5)

## 2015-12-31 MED ORDER — LIDOCAINE-PRILOCAINE 2.5-2.5 % EX CREA: 1.0000 "application " | TOPICAL_CREAM | CUTANEOUS | Status: DC | PRN

## 2015-12-31 MED ORDER — ALTEPLASE 2 MG IJ SOLR
2.0000 mg | Freq: Once | INTRAMUSCULAR | Status: DC | PRN
  Filled 2015-12-31: qty 2

## 2015-12-31 MED ORDER — LIDOCAINE HCL (PF) 1 % IJ SOLN: 5.0000 mL | INTRAMUSCULAR | Status: DC | PRN

## 2015-12-31 MED ORDER — HEPARIN SODIUM (PORCINE) 1000 UNIT/ML DIALYSIS: 2000.0000 [IU] | Freq: Once | INTRAMUSCULAR | Status: DC

## 2015-12-31 MED ORDER — PENTAFLUOROPROP-TETRAFLUOROETH EX AERO: 1.0000 "application " | INHALATION_SPRAY | CUTANEOUS | Status: DC | PRN

## 2015-12-31 MED ORDER — SODIUM CHLORIDE 0.9 % IV SOLN: 100.0000 mL | INTRAVENOUS | Status: DC | PRN

## 2015-12-31 MED ORDER — POLYETHYLENE GLYCOL 3350 17 G PO PACK: 17.0000 g | PACK | Freq: Every day | ORAL | Status: DC

## 2015-12-31 MED ORDER — HYDRALAZINE HCL 50 MG PO TABS
50.0000 mg | ORAL_TABLET | Freq: Two times a day (BID) | ORAL | Status: DC
  Administered 2015-12-31 – 2016-01-03 (×4): 50 mg via ORAL
  Filled 2015-12-31 (×4): qty 1

## 2015-12-31 MED ORDER — POLYETHYLENE GLYCOL 3350 17 G PO PACK
17.0000 g | PACK | Freq: Two times a day (BID) | ORAL | Status: DC
  Administered 2015-12-31 – 2016-01-03 (×5): 17 g via ORAL
  Filled 2015-12-31 (×5): qty 1

## 2015-12-31 MED ORDER — HEPARIN SODIUM (PORCINE) 1000 UNIT/ML DIALYSIS: 1000.0000 [IU] | INTRAMUSCULAR | Status: DC | PRN

## 2015-12-31 NOTE — NC FL2 (Signed)
[place] MEDICAID FL2 LEVEL OF CARE SCREENING TOOL     IDENTIFICATION  Patient Name: [name] Birthdate: [birth year] Sex: male Admission Date (Current Location): 12/26/2015  [facility] and [place] Number:  Herbalist and Address:  [facility] [place], [place] [address], [place], [place] [address]      Provider Number: [id number]  Attending Physician Name and Address:  Reyne Dumas, MD  Relative Name and Phone Number:       Current Level of Care: Hospital Recommended Level of Care: [place] Prior Approval Number:    Date Approved/Denied:   PASRR Number: [id number]  Discharge Plan: [name]    Current Diagnoses: Patient Active Problem List   Diagnosis Date Noted  . ESRD (end stage renal disease) ([place])   . Cough   . Weakness generalized 12/26/2015  . Essential hypertension   . Chest pain 08/12/2015  . Malignant hypertension 08/12/2015  . Carotid stenosis   . TIA (transient ischemic attack) 04/04/2015  . Weakness 04/03/2015  . Stroke ([place]) 04/03/2015  . Abdominal aortic aneurysm ([place]) 03/06/2015  . Generalized weakness 09/08/2014  . Acute encephalopathy 09/07/2014  . CAD (coronary artery disease) 08/08/2014  . NSTEMI (non-ST elevated myocardial infarction) - Presumed Type II in setting of HTN Emergency 07/07/2014  . Hypertensive emergency 07/05/2014  . Acute on chronic combined systolic and diastolic CHF (congestive heart failure) ([place]) 07/05/2014  . Protein-calorie malnutrition, severe ([place]) 05/20/2014  . Hypokalemia 05/19/2014  . Syncope and collapse 05/19/2014  . ESRD on hemodialysis ([place]) 05/19/2014  . Anemia of renal disease 05/19/2014  . Coronary artery disease, non-occlusive: Cath 2013 - Moderate D1 & RCA disease 09/28/2012  . Anemia 09/27/2012  . Secondary hyperparathyroidism ([place]) 09/27/2012  . Non compliance with medical treatment 09/27/2012  . Chronic combined systolic and diastolic CHF (congestive heart failure)  ([place]) 10/10/2011  . A-fib ([place]) 10/08/2011  . History of stroke 10/07/2011  . Cocaine abuse 10/07/2011  . Severe sinus bradycardia 10/07/2011  . Nonischemic cardiomyopathy ([place]) 09/18/2011  . Tobacco use disorder 08/07/2011  . Bruit 08/07/2011    Orientation RESPIRATION BLADDER Height & Weight     Self, Time, Situation, Place  O2 Continent Weight: 137 lb 2 oz (62.2 kg) (Standing scale) Height:  5\' 10"  (177.8 cm)  BEHAVIORAL SYMPTOMS/MOOD NEUROLOGICAL BOWEL NUTRITION STATUS   (NONE)  (NONE) Continent Diet (Renal w/ fluid restriction 1229ml)  AMBULATORY STATUS COMMUNICATION OF NEEDS Skin   Extensive Assist Verbally Normal                       Personal Care Assistance Level of Assistance  Bathing, Dressing Bathing Assistance: Limited assistance   Dressing Assistance: Limited assistance     Functional Limitations Info   (NONE)          SPECIAL CARE FACTORS FREQUENCY  PT (By licensed PT), OT (By licensed OT)     PT Frequency: 3 OT Frequency: 2            Contractures      Additional Factors Info  Code Status, Allergies Code Status Info: FULL CODE  Allergies Info: N/A           Current Medications (12/31/2015):  This is the current hospital active medication list Current Facility-Administered Medications  Medication Dose Route Frequency Provider Last Rate Last Dose  . acetaminophen (TYLENOL) tablet 650 mg  650 mg Oral Q6H PRN Domenic Polite, MD       Or  .  acetaminophen (TYLENOL) suppository 650 mg  650 mg Rectal Q6H PRN Domenic Polite, MD      . aspirin chewable tablet 81 mg  81 mg Oral Daily Domenic Polite, MD   81 mg at 12/31/15 1342  . atorvastatin (LIPITOR) tablet 40 mg  40 mg Oral q1800 Domenic Polite, MD   40 mg at 12/30/15 1752  . benzonatate (TESSALON) capsule 100 mg  100 mg Oral TID PRN Ritta Slot, NP   100 mg at 12/29/15 0430  . calcium acetate (PHOSLO) capsule 667 mg  667 mg Oral TID WC Domenic Polite, MD   667 mg at 12/31/15 1341  .  carvedilol (COREG) tablet 12.5 mg  12.5 mg Oral BID WC [name], MD   12.5 mg at 12/30/15 1614  . cloNIDine (CATAPRES) tablet 0.1 mg  0.1 mg Oral BID [name], MD   0.1 mg at 12/31/15 1341  . clopidogrel (PLAVIX) tablet 75 mg  75 mg Oral Daily Domenic Polite, MD   75 mg at 12/31/15 1341  . gabapentin (NEURONTIN) capsule 100 mg  100 mg Oral QHS Domenic Polite, MD   100 mg at 12/30/15 2129  . heparin injection 5,000 Units  5,000 Units Subcutaneous 3 times per day Domenic Polite, MD   5,000 Units at 12/31/15 1342  . hydrALAZINE (APRESOLINE) tablet 50 mg  50 mg Oral BID Corliss Parish, MD      . meclizine (ANTIVERT) tablet 25 mg  25 mg Oral TID PRN Reyne Dumas, MD      . menthol-cetylpyridinium (CEPACOL) lozenge 3 mg  1 lozenge Oral PRN Domenic Polite, MD      . metoCLOPramide (REGLAN) injection 5 mg  5 mg Intravenous Q6H PRN Reyne Dumas, MD   5 mg at 12/27/15 1816  . midodrine (PROAMATINE) tablet 5 mg  5 mg Oral TID WC Reyne Dumas, MD   5 mg at 12/30/15 1753  . multivitamin (RENA-VIT) tablet 1 tablet  1 tablet Oral QHS Domenic Polite, MD   1 tablet at 12/30/15 2129  . ondansetron (ZOFRAN) tablet 4 mg  4 mg Oral Q6H PRN Domenic Polite, MD       Or  . ondansetron [facility]) injection 4 mg  4 mg Intravenous Q6H PRN Domenic Polite, MD   4 mg at 12/27/15 1641  . oxyCODONE (Oxy IR/ROXICODONE) immediate release tablet 5 mg  5 mg Oral Q6H PRN Reyne Dumas, MD   5 mg at 12/31/15 1343  . pantoprazole (PROTONIX) EC tablet 40 mg  40 mg Oral Daily Domenic Polite, MD   40 mg at 12/31/15 1341     Discharge Medications: Please see discharge summary for a list of discharge medications.  Relevant Imaging Results:  Relevant Lab Results:   Additional Information SSN [id number]  Rozell Searing, LCSW

## 2015-12-31 NOTE — Procedures (Signed)
Patient was seen on dialysis and the procedure was supervised.  BFR 400  Via AVF BP is  108/68.   Patient appears to be tolerating treatment well- low BP- will stop UF   [name] A 12/31/2015

## 2015-12-31 NOTE — Progress Notes (Signed)
[name] Progress Note   Subjective: standing BP's are all over A999333 systolic, but patient still feels "bad". He feels bad lying ,sitting or standing but the worst with standing. pCXR clear. ECHO EF 45% otherwise nothing abnormal.    MRI/ MRA of brain showed no lesions and no sig arterial disease. Seen on HD - goal of 2100 - BP 124/60   Filed Vitals:   12/31/15 0830 12/31/15 0900 12/31/15 0929 12/31/15 0959  BP: 130/58 118/53 111/60 124/60  Pulse: 62 62 66 71  Temp:      TempSrc:      Resp: 10 12 15 16   Height:      Weight:      SpO2:        Inpatient medications: . aspirin  81 mg Oral Daily  . atorvastatin  40 mg Oral q1800  . calcium acetate  667 mg Oral TID WC  . carvedilol  12.5 mg Oral BID WC  . cloNIDine  0.1 mg Oral BID  . clopidogrel  75 mg Oral Daily  . gabapentin  100 mg Oral QHS  . [START ON 01/01/2016] heparin  2,000 Units Dialysis Once in dialysis  . heparin  5,000 Units Subcutaneous 3 times per day  . hydrALAZINE  50 mg Oral TID  . midodrine  5 mg Oral TID WC  . multivitamin  1 tablet Oral QHS  . pantoprazole  40 mg Oral Daily     sodium chloride, sodium chloride, acetaminophen **OR** acetaminophen, alteplase, benzonatate, heparin, lidocaine (PF), lidocaine-prilocaine, meclizine, menthol-cetylpyridinium, metoCLOPramide (REGLAN) injection, ondansetron **OR** ondansetron (ZOFRAN) IV, oxyCODONE, pentafluoroprop-tetrafluoroeth  Exam: No distress, calm, RA SaO2 98% Lying BP 160/80 , sitting 150/70, standing 145/70 No jvd Chest clear bilat RRR no rmg Abd soft no hsm or ascites No LE or UE edema LUA AVF +bruit Neuro is blind, alert   CT head - chron changes, no acute CXR x 2 - CM, no acute disease ECHO - LV 40-45%, LVH, G1DD, no valve/ RV disease  MWF [place] started 2014 4h  62kg  2/2.25 bath  Hep 1900  LUA AVF (BBF)   Assessment: 1 Orthostatic lightheadness - head CT neg, ECHO normal except for EF 40-45%. Gave 3L NS and didn't  improve his symptoms at all.  EKG/ trop's neg on admission.  MRI/ MRA brain unremarkable. Not sure cause of his problems. ?vestibular.  I agree with titrating his BP meds down- especially the clonidine - also may just be very deconditioned ? So agree with PT 2 ESRD MWF via AVF- getting today  3 HTN resumed all 3 bp meds- will have higher EDW at discharge and we may need to tolerate a higher BP when sitting because we know BP drops with standing.  I am afraid that midodrine will cause BP to be too high but could be a consideration 4 Anemia stable 5 MBD cont meds    [name] A   12/31/2015, 10:16 AM    Recent Labs Lab 12/27/15 0505 12/28/15 0350 12/31/15 0740  NA 140 140 139  K 4.9 4.1 4.8  CL 100* 101 103  CO2 25 29 24   GLUCOSE 81 79 84  BUN 47* 21* 38*  CREATININE 10.66* 6.23* 9.36*  CALCIUM 8.8* 8.7* 9.3    Recent Labs Lab 12/27/15 0505 12/28/15 0350  AST 9* 11*  ALT 8* 6*  ALKPHOS 74 66  BILITOT 0.7 0.5  PROT 6.0* 5.6*  ALBUMIN 2.8* 2.5*    Recent Labs Lab 12/27/15 0505  12/28/15 0350 12/31/15 0740  WBC 5.2 4.0 4.0  HGB 10.8* 9.8* 9.7*  HCT 32.7* 30.1* 28.1*  MCV 90.1 90.4 90.4  PLT 159 131* 123*

## 2015-12-31 NOTE — Progress Notes (Signed)
[facility] PROGRESS NOTE  Trevor [name] [id number] DOB: 09/29/1946 DOA: 12/26/2015 PCP: [name], MD  Length of stay: 5   Assessment/Plan: Active Problems:   Nonischemic cardiomyopathy ([place])   History of stroke   Chronic combined systolic and diastolic CHF (congestive heart failure) ([place])   Coronary artery disease, non-occlusive: Cath 2013 - Moderate D1 & RCA disease   ESRD on hemodialysis ([place])   Protein-calorie malnutrition, severe ([facility])   Generalized weakness   Weakness generalized   Cough   ESRD (end stage renal disease) ([place])     Brief summary 70 y.o. male ESRD dialyzing at [facility] with Dr. Marval Regal with a EDW of 62kg and presenting with generalized weakness which started after dialysis on Monday but worsened today. He also states that he has had subjective fevers and a productive cough. He is blind in the right eye and sees some shapes through the left eye; therefore was not able to see what he was coughing up. He also has had intermittent headaches which he states is new. He missed dialysis today because he was feeling weak; he is weaker on the right side but is able to ambulate with a walker + uses a wheelchair as well. He denies any nausea, vomiting, diarrhea, abdominal pain, sore throat or rhinorrhea. He also denies chest pain or palpitations. He admits to not taking his medications for a whole month and then restarted taking them on 1/24 after a home health nurse visited [facility].  Assessment and plan Generalized weakness, likely related to orthostatic hypotension, systolic blood pressure dropped from 171>125 at 4 AM this morning   has some symptoms of a URI, cough, low-grade fever, chest x-ray negative, no signs of infection Blood culture no growth so far times 2, flu PCR negative Lactic acid, ammonia, TSH free T4 within normal limits -It is possible that resuming all his home medicines yesterday after a long gap could be potentially contributing, - no  focal neurological symptoms, no meningeal signs  PT/OT evaluation pending reduce  blood pressure medications, received a total of 3 L of normal saline with hemodialysis Patient has resting hypertension, [id number] to [id number] systolic MRI MRA of the brain to rule out CVA, vertebrobasilar insufficiency, cervical spinal stenosis? 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 (cont ASA, plavix ) Started  midodrine for orthostatic Hypotension, DC'd  flomax, proscar >18% incidence of orthostasis  PT eval for vestibular rehabilitation  Nonischemic cardiomyopathy, combined chronic systolic diastolic On  0000000 EF was 55-60%, repeat 2-D echo on 1/27 shows EF of 45-50% -History of non-ST elevation MI 08/17/15 that showed an EF of 19%, He was advised to continue with aspirin Coreg and Plavix, statin, Currently Coreg is on hold,    Hypertension-currently on Coreg, hydralazine, clonidine, nephrology to titrate these further  ESRD on HD Monday Wednesday Friday Nephrology following    History of stroke - exam is nonfocal, CT unremarkable,MRI old CVA  Continue ASA and  Plavix    Chronic combined systolic and diastolic CHF (congestive heart failure)  -Volume management HD, continue Coreg    Protein-calorie malnutrition, severe ([place]) -RD consult   DVT prophylaxsis heparin  Code Status:      Code Status Orders        Start     Ordered   12/26/15 2046  Full code   Continuous     12/26/15 2045      Family Communication: Discussed in detail with the patient, all imaging  results, lab results explained to the patient   Disposition Plan:  PT OT evaluation, pending      Consultants:  Nephrology  Procedures:  None  Antibiotics: Anti-infectives    None         HPI/Subjective:  Continues to say that he is lightheaded with minimal activity  Objective: Filed Vitals:   12/31/15 0959 12/31/15 1024 12/31/15 1058 12/31/15 1121  BP: 124/60 108/68 142/61 150/59  Pulse:  71 70 73 65  Temp:    98 F (36.7 C)  TempSrc:    Oral  Resp: 16 18 20 20   Height:      Weight:      SpO2:        Intake/Output Summary (Last 24 hours) at 12/31/15 1132 Last data filed at 12/31/15 1121  Gross per 24 hour  Intake      0 ml  Output    990 ml  Net   -990 ml    Exam:  General: No acute respiratory distress Lungs: Clear to auscultation bilaterally without wheezes or crackles Cardiovascular: Regular rate and rhythm without murmur gallop or rub normal S1 and S2 Abdomen: Nontender, nondistended, soft, bowel sounds positive, no rebound, no ascites, no appreciable mass Extremities: No significant cyanosis, clubbing, or edema bilateral lower extremities     Data Review   Micro Results Recent Results (from the past 240 hour(s))  Urine culture     Status: None   Collection Time: 12/26/15  5:38 PM  Result Value Ref Range Status   Specimen Description URINE, CLEAN CATCH  Final   Special Requests NONE  Final   Culture MULTIPLE SPECIES PRESENT, SUGGEST RECOLLECTION  Final   Report Status 12/28/2015 FINAL  Final  Culture, blood (Routine X 2) w Reflex to ID Panel     Status: None (Preliminary result)   Collection Time: 12/27/15  1:30 PM  Result Value Ref Range Status   Specimen Description HEMODIALYSIS LINE  Final   Special Requests BOTTLES DRAWN AEROBIC AND ANAEROBIC 10MLS  Final   Culture NO GROWTH 3 DAYS  Final   Report Status PENDING  Incomplete  Culture, blood (Routine X 2) w Reflex to ID Panel     Status: None (Preliminary result)   Collection Time: 12/27/15  1:40 PM  Result Value Ref Range Status   Specimen Description HEMODIALYSIS LINE  Final   Special Requests BOTTLES DRAWN AEROBIC AND ANAEROBIC 10MLS  Final   Culture NO GROWTH 3 DAYS  Final   Report Status PENDING  Incomplete    Radiology Reports Dg Chest 2 View  12/26/2015  CLINICAL DATA:  Generalized weakness EXAM: CHEST  2 VIEW COMPARISON:  08/12/2015 FINDINGS: Cardiomegaly. Normal vascularity. New  oval density over the right upper lung zone may reflect an overlying object. Minimal basilar hypoaeration change. IMPRESSION: Right upper lobe nodular density may reflect an overlying object. Repeat after removal of overlying objects is recommended. Cardiomegaly without edema. Electronically Signed   By: Marybelle Killings M.D.   On: 12/26/2015 14:04   Ct Head Wo Contrast  12/26/2015  CLINICAL DATA:  Generalized weakness EXAM: CT HEAD WITHOUT CONTRAST TECHNIQUE: Contiguous axial images were obtained from the base of the skull through the vertex without intravenous contrast. COMPARISON:  04/04/2015.  04/03/2015. FINDINGS: Encephalomalacia in the left frontal and right occipital lobes is stable. Chronic ischemic changes in the periventricular white matter. No mass effect, midline shift, or acute hemorrhage. Mastoid air cells are clear. Visualized paranasal sinuses are clear. IMPRESSION: Chronic  ischemic changes.  No acute intracranial pathology. Electronically Signed   By: Marybelle Killings M.D.   On: 12/26/2015 13:28   Mr Jodene Nam Head Wo Contrast  12/29/2015  CLINICAL DATA:  Development of dizziness in weakness beginning 12/26/2015. EXAM: MRI HEAD WITHOUT CONTRAST MRA HEAD WITHOUT CONTRAST MRA NECK WITHOUT CONTRAST TECHNIQUE: Multiplanar, multiecho pulse sequences of the brain and surrounding structures were obtained without intravenous contrast. Angiographic images of the Circle of Willis were obtained using MRA technique without intravenous contrast. Angiographic images of the neck were obtained using MRA technique without intravenous contrast. Carotid stenosis measurements (when applicable) are obtained utilizing NASCET criteria, using the distal internal carotid diameter as the denominator. COMPARISON:  Head CT 12/26/2015 FINDINGS: MRI HEAD FINDINGS Diffusion imaging does not show any acute or subacute infarction. There chronic small-vessel ischemic changes throughout the pons. No focal cerebellar insult. Cerebral  hemispheres show old infarction in the right PCA territory affecting the posterior medial temporal lobe and parietal lobe. There is an old left frontal cortical and subcortical infarction. Chronic small-vessel ischemic changes are present elsewhere throughout the cerebral hemispheric white matter. No mass lesion, recent hemorrhage, hydrocephalus or extra-axial collection. No pituitary mass. No inflammatory sinus disease. No skull or skullbase lesion. MRA HEAD FINDINGS Both internal carotid arteries are patent into the brain, but there is atherosclerotic narrowing and irregularity in both carotid siphon regions. The right internal carotid artery shows supraclinoid stenosis. This vessel supplies the right middle cerebral artery territory and a diseased A1 segment on the right. The left internal carotid artery supplies the left middle cerebral artery territory gives the majority of the supply to both anterior cerebral artery territories. There is some atherosclerotic narrowing and irregularity in the more distal branch vessels bilaterally. Both vertebral arteries are patent with the right being dominant. The left vertebral artery shows atherosclerotic narrowing and irregularity at the foramen magnum and in the distal vertebral artery. There are serial stenoses estimated at 50%. The basilar artery shows atherosclerotic narrowing and irregularity with maximal stenosis measuring 30%. Superior cerebellar and posterior cerebral vessels are patent, with atherosclerotic irregularity of the more distal PCA branches. MRA NECK FINDINGS Both common carotid arteries are widely patent to the bifurcation regions. Both carotid bifurcations are patent with mild atherosclerotic change. Narrowing in the right ICA bulb is estimated at 25%. There is no stenosis on the left. Both vertebral arteries are patent through the cervical region without stenosis. Proximal vessels/intra thoracic segments are not evaluated given the noncontrast  technique. IMPRESSION: MRI head: No acute insult. 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 Extensive chronic small vessel disease. MRA neck: No significant carotid bifurcation disease. Antegrade flow in both vertebral arteries. MRA head: No major vessel occlusion at this time. Extensive atherosclerotic narrowing and irregularity in both carotid siphon regions. Intracranial atherosclerotic disease diffusely affecting the more distal branch vessels as well as the A1 segment on the right. There is considerable atherosclerotic disease of the distal left vertebral artery with serial 50% stenoses. Electronically Signed   By: Nelson Chimes M.D.   On: 12/29/2015 13:40   Mr Angiogram Neck Wo Contrast  12/29/2015  CLINICAL DATA:  Development of dizziness in weakness beginning 12/26/2015. EXAM: MRI HEAD WITHOUT CONTRAST MRA HEAD WITHOUT CONTRAST MRA NECK WITHOUT CONTRAST TECHNIQUE: Multiplanar, multiecho pulse sequences of the brain and surrounding structures were obtained without intravenous contrast. Angiographic images of the Circle of Willis were obtained using MRA technique without intravenous contrast. Angiographic images of the neck were obtained using  MRA technique without intravenous contrast. Carotid stenosis measurements (when applicable) are obtained utilizing NASCET criteria, using the distal internal carotid diameter as the denominator. COMPARISON:  Head CT 12/26/2015 FINDINGS: MRI HEAD FINDINGS Diffusion imaging does not show any acute or subacute infarction. There chronic small-vessel ischemic changes throughout the pons. No focal cerebellar insult. Cerebral hemispheres show old infarction in the right PCA territory affecting the posterior medial temporal lobe and parietal lobe. There is an old left frontal cortical and subcortical infarction. Chronic small-vessel ischemic changes are present elsewhere throughout the cerebral hemispheric white matter. No mass lesion, recent hemorrhage,  hydrocephalus or extra-axial collection. No pituitary mass. No inflammatory sinus disease. No skull or skullbase lesion. MRA HEAD FINDINGS Both internal carotid arteries are patent into the brain, but there is atherosclerotic narrowing and irregularity in both carotid siphon regions. The right internal carotid artery shows supraclinoid stenosis. This vessel supplies the right middle cerebral artery territory and a diseased A1 segment on the right. The left internal carotid artery supplies the left middle cerebral artery territory gives the majority of the supply to both anterior cerebral artery territories. There is some atherosclerotic narrowing and irregularity in the more distal branch vessels bilaterally. Both vertebral arteries are patent with the right being dominant. The left vertebral artery shows atherosclerotic narrowing and irregularity at the foramen magnum and in the distal vertebral artery. There are serial stenoses estimated at 50%. The basilar artery shows atherosclerotic narrowing and irregularity with maximal stenosis measuring 30%. Superior cerebellar and posterior cerebral vessels are patent, with atherosclerotic irregularity of the more distal PCA branches. MRA NECK FINDINGS Both common carotid arteries are widely patent to the bifurcation regions. Both carotid bifurcations are patent with mild atherosclerotic change. Narrowing in the right ICA bulb is estimated at 25%. There is no stenosis on the left. Both vertebral arteries are patent through the cervical region without stenosis. Proximal vessels/intra thoracic segments are not evaluated given the noncontrast technique. IMPRESSION: MRI head: No acute insult. 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 Extensive chronic small vessel disease. MRA neck: No significant carotid bifurcation disease. Antegrade flow in both vertebral arteries. MRA head: No major vessel occlusion at this time. Extensive atherosclerotic narrowing and  irregularity in both carotid siphon regions. Intracranial atherosclerotic disease diffusely affecting the more distal branch vessels as well as the A1 segment on the right. There is considerable atherosclerotic disease of the distal left vertebral artery with serial 50% stenoses. Electronically Signed   By: Nelson Chimes M.D.   On: 12/29/2015 13:40   Mr Brain Wo Contrast  12/29/2015  CLINICAL DATA:  Development of dizziness in weakness beginning 12/26/2015. EXAM: MRI HEAD WITHOUT CONTRAST MRA HEAD WITHOUT CONTRAST MRA NECK WITHOUT CONTRAST TECHNIQUE: Multiplanar, multiecho pulse sequences of the brain and surrounding structures were obtained without intravenous contrast. Angiographic images of the Circle of Willis were obtained using MRA technique without intravenous contrast. Angiographic images of the neck were obtained using MRA technique without intravenous contrast. Carotid stenosis measurements (when applicable) are obtained utilizing NASCET criteria, using the distal internal carotid diameter as the denominator. COMPARISON:  Head CT 12/26/2015 FINDINGS: MRI HEAD FINDINGS Diffusion imaging does not show any acute or subacute infarction. There chronic small-vessel ischemic changes throughout the pons. No focal cerebellar insult. Cerebral hemispheres show old infarction in the right PCA territory affecting the posterior medial temporal lobe and parietal lobe. There is an old left frontal cortical and subcortical infarction. Chronic small-vessel ischemic changes are present elsewhere throughout the cerebral  hemispheric white matter. No mass lesion, recent hemorrhage, hydrocephalus or extra-axial collection. No pituitary mass. No inflammatory sinus disease. No skull or skullbase lesion. MRA HEAD FINDINGS Both internal carotid arteries are patent into the brain, but there is atherosclerotic narrowing and irregularity in both carotid siphon regions. The right internal carotid artery shows supraclinoid stenosis.  This vessel supplies the right middle cerebral artery territory and a diseased A1 segment on the right. The left internal carotid artery supplies the left middle cerebral artery territory gives the majority of the supply to both anterior cerebral artery territories. There is some atherosclerotic narrowing and irregularity in the more distal branch vessels bilaterally. Both vertebral arteries are patent with the right being dominant. The left vertebral artery shows atherosclerotic narrowing and irregularity at the foramen magnum and in the distal vertebral artery. There are serial stenoses estimated at 50%. The basilar artery shows atherosclerotic narrowing and irregularity with maximal stenosis measuring 30%. Superior cerebellar and posterior cerebral vessels are patent, with atherosclerotic irregularity of the more distal PCA branches. MRA NECK FINDINGS Both common carotid arteries are widely patent to the bifurcation regions. Both carotid bifurcations are patent with mild atherosclerotic change. Narrowing in the right ICA bulb is estimated at 25%. There is no stenosis on the left. Both vertebral arteries are patent through the cervical region without stenosis. Proximal vessels/intra thoracic segments are not evaluated given the noncontrast technique. IMPRESSION: MRI head: No acute insult. 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 Extensive chronic small vessel disease. MRA neck: No significant carotid bifurcation disease. Antegrade flow in both vertebral arteries. MRA head: No major vessel occlusion at this time. Extensive atherosclerotic narrowing and irregularity in both carotid siphon regions. Intracranial atherosclerotic disease diffusely affecting the more distal branch vessels as well as the A1 segment on the right. There is considerable atherosclerotic disease of the distal left vertebral artery with serial 50% stenoses. Electronically Signed   By: Nelson Chimes M.D.   On: 12/29/2015 13:40    Dg Chest Port 1 View  12/30/2015  CLINICAL DATA:  Acute onset of severe shortness of breath and left-sided chest pain that began earlier today. Current history of hypertension, cardiomyopathy, end-stage renal disease. Prior MI and stroke. EXAM: PORTABLE CHEST 1 VIEW COMPARISON:  Portable chest x-ray 12/27/2015. Two-view chest x-ray 12/26/2015 and earlier. FINDINGS: Cardiac silhouette mildly to moderately enlarged for AP portable technique, unchanged. Thoracic aorta tortuous and atherosclerotic, unchanged. Hilar and mediastinal contours otherwise unremarkable. Minimal atelectasis at the lung bases. Lungs otherwise clear. Bronchovascular markings normal. No localized airspace consolidation. No pleural effusions. No pneumothorax. Normal pulmonary vascularity. IMPRESSION: Minimal bibasilar atelectasis. No acute cardiopulmonary disease otherwise. Stable cardiomegaly without pulmonary edema. Electronically Signed   By: Evangeline Dakin M.D.   On: 12/30/2015 15:19   Dg Chest Port 1 View  12/27/2015  CLINICAL DATA:  Productive cough for 2 days, weakness - hx of MI, stroke, htn, SOB EXAM: PORTABLE CHEST 1 VIEW COMPARISON:  12/25/2013 FINDINGS: The heart size and mediastinal contours are within normal limits. Both lungs are clear. The visualized skeletal structures are unremarkable. IMPRESSION: No active disease. Electronically Signed   By: Nolon Nations M.D.   On: 12/27/2015 12:21     CBC  Recent Labs Lab 12/26/15 1135 12/27/15 0505 12/28/15 0350 12/31/15 0740  WBC 4.5 5.2 4.0 4.0  HGB 12.8* 10.8* 9.8* 9.7*  HCT 38.7* 32.7* 30.1* 28.1*  PLT 168 159 131* 123*  MCV 90.2 90.1 90.4 90.4  MCH 29.8 29.8 29.4 31.2  MCHC 33.1 33.0 32.6 34.5  RDW 17.4* 17.2* 16.9* 16.6*    Chemistries   Recent Labs Lab 12/26/15 1135 12/27/15 0505 12/28/15 0350 12/31/15 0740  NA 140 140 140 139  K 4.5 4.9 4.1 4.8  CL 100* 100* 101 103  CO2 25 25 29 24   GLUCOSE 148* 81 79 84  BUN 34* 47* 21* 38*  CREATININE  9.33* 10.66* 6.23* 9.36*  CALCIUM 9.6 8.8* 8.7* 9.3  AST  --  9* 11*  --   ALT  --  8* 6*  --   ALKPHOS  --  74 66  --   BILITOT  --  0.7 0.5  --    ------------------------------------------------------------------------------------------------------------------ estimated creatinine clearance is 6.6 mL/min (by C-G formula based on Cr of 9.36). ------------------------------------------------------------------------------------------------------------------ No results for input(s): HGBA1C in the last 72 hours. ------------------------------------------------------------------------------------------------------------------ No results for input(s): CHOL, HDL, LDLCALC, TRIG, CHOLHDL, LDLDIRECT in the last 72 hours. ------------------------------------------------------------------------------------------------------------------ No results for input(s): TSH, T4TOTAL, T3FREE, THYROIDAB in the last 72 hours.  Invalid input(s): FREET3 ------------------------------------------------------------------------------------------------------------------ No results for input(s): VITAMINB12, FOLATE, FERRITIN, TIBC, IRON, RETICCTPCT in the last 72 hours.  Coagulation profile No results for input(s): INR, PROTIME in the last 168 hours.  No results for input(s): DDIMER in the last 72 hours.  Cardiac Enzymes No results for input(s): CKMB, TROPONINI, MYOGLOBIN in the last 168 hours.  Invalid input(s): CK ------------------------------------------------------------------------------------------------------------------ Invalid input(s): POCBNP   CBG: No results for input(s): GLUCAP in the last 168 hours.     Studies: Mr Virgel Paling [id number] Contrast  12/29/2015  CLINICAL DATA:  Development of dizziness in weakness beginning 12/26/2015. EXAM: MRI HEAD WITHOUT CONTRAST MRA HEAD WITHOUT CONTRAST MRA NECK WITHOUT CONTRAST TECHNIQUE: Multiplanar, multiecho pulse sequences of the brain and surrounding  structures were obtained without intravenous contrast. Angiographic images of the Circle of Willis were obtained using MRA technique without intravenous contrast. Angiographic images of the neck were obtained using MRA technique without intravenous contrast. Carotid stenosis measurements (when applicable) are obtained utilizing NASCET criteria, using the distal internal carotid diameter as the denominator. COMPARISON:  Head CT 12/26/2015 FINDINGS: MRI HEAD FINDINGS Diffusion imaging does not show any acute or subacute infarction. There chronic small-vessel ischemic changes throughout the pons. No focal cerebellar insult. Cerebral hemispheres show old infarction in the right PCA territory affecting the posterior medial temporal lobe and parietal lobe. There is an old left frontal cortical and subcortical infarction. Chronic small-vessel ischemic changes are present elsewhere throughout the cerebral hemispheric white matter. No mass lesion, recent hemorrhage, hydrocephalus or extra-axial collection. No pituitary mass. No inflammatory sinus disease. No skull or skullbase lesion. MRA HEAD FINDINGS Both internal carotid arteries are patent into the brain, but there is atherosclerotic narrowing and irregularity in both carotid siphon regions. The right internal carotid artery shows supraclinoid stenosis. This vessel supplies the right middle cerebral artery territory and a diseased A1 segment on the right. The left internal carotid artery supplies the left middle cerebral artery territory gives the majority of the supply to both anterior cerebral artery territories. There is some atherosclerotic narrowing and irregularity in the more distal branch vessels bilaterally. Both vertebral arteries are patent with the right being dominant. The left vertebral artery shows atherosclerotic narrowing and irregularity at the foramen magnum and in the distal vertebral artery. There are serial stenoses estimated at 50%. The basilar  artery shows atherosclerotic narrowing and irregularity with maximal stenosis measuring 30%. Superior cerebellar and posterior cerebral vessels are patent, with atherosclerotic irregularity of the  more distal PCA branches. MRA NECK FINDINGS Both common carotid arteries are widely patent to the bifurcation regions. Both carotid bifurcations are patent with mild atherosclerotic change. Narrowing in the right ICA bulb is estimated at 25%. There is no stenosis on the left. Both vertebral arteries are patent through the cervical region without stenosis. Proximal vessels/intra thoracic segments are not evaluated given the noncontrast technique. IMPRESSION: MRI head: No acute insult. 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 Extensive chronic small vessel disease. MRA neck: No significant carotid bifurcation disease. Antegrade flow in both vertebral arteries. MRA head: No major vessel occlusion at this time. Extensive atherosclerotic narrowing and irregularity in both carotid siphon regions. Intracranial atherosclerotic disease diffusely affecting the more distal branch vessels as well as the A1 segment on the right. There is considerable atherosclerotic disease of the distal left vertebral artery with serial 50% stenoses. Electronically Signed   By: Nelson Chimes M.D.   On: 12/29/2015 13:40   Mr Angiogram Neck Wo Contrast  12/29/2015  CLINICAL DATA:  Development of dizziness in weakness beginning 12/26/2015. EXAM: MRI HEAD WITHOUT CONTRAST MRA HEAD WITHOUT CONTRAST MRA NECK WITHOUT CONTRAST TECHNIQUE: Multiplanar, multiecho pulse sequences of the brain and surrounding structures were obtained without intravenous contrast. Angiographic images of the Circle of Willis were obtained using MRA technique without intravenous contrast. Angiographic images of the neck were obtained using MRA technique without intravenous contrast. Carotid stenosis measurements (when applicable) are obtained utilizing NASCET  criteria, using the distal internal carotid diameter as the denominator. COMPARISON:  Head CT 12/26/2015 FINDINGS: MRI HEAD FINDINGS Diffusion imaging does not show any acute or subacute infarction. There chronic small-vessel ischemic changes throughout the pons. No focal cerebellar insult. Cerebral hemispheres show old infarction in the right PCA territory affecting the posterior medial temporal lobe and parietal lobe. There is an old left frontal cortical and subcortical infarction. Chronic small-vessel ischemic changes are present elsewhere throughout the cerebral hemispheric white matter. No mass lesion, recent hemorrhage, hydrocephalus or extra-axial collection. No pituitary mass. No inflammatory sinus disease. No skull or skullbase lesion. MRA HEAD FINDINGS Both internal carotid arteries are patent into the brain, but there is atherosclerotic narrowing and irregularity in both carotid siphon regions. The right internal carotid artery shows supraclinoid stenosis. This vessel supplies the right middle cerebral artery territory and a diseased A1 segment on the right. The left internal carotid artery supplies the left middle cerebral artery territory gives the majority of the supply to both anterior cerebral artery territories. There is some atherosclerotic narrowing and irregularity in the more distal branch vessels bilaterally. Both vertebral arteries are patent with the right being dominant. The left vertebral artery shows atherosclerotic narrowing and irregularity at the foramen magnum and in the distal vertebral artery. There are serial stenoses estimated at 50%. The basilar artery shows atherosclerotic narrowing and irregularity with maximal stenosis measuring 30%. Superior cerebellar and posterior cerebral vessels are patent, with atherosclerotic irregularity of the more distal PCA branches. MRA NECK FINDINGS Both common carotid arteries are widely patent to the bifurcation regions. Both carotid  bifurcations are patent with mild atherosclerotic change. Narrowing in the right ICA bulb is estimated at 25%. There is no stenosis on the left. Both vertebral arteries are patent through the cervical region without stenosis. Proximal vessels/intra thoracic segments are not evaluated given the noncontrast technique. IMPRESSION: MRI head: No acute insult. 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 Extensive chronic small vessel disease. MRA neck: No significant carotid bifurcation disease.  Antegrade flow in both vertebral arteries. MRA head: No major vessel occlusion at this time. Extensive atherosclerotic narrowing and irregularity in both carotid siphon regions. Intracranial atherosclerotic disease diffusely affecting the more distal branch vessels as well as the A1 segment on the right. There is considerable atherosclerotic disease of the distal left vertebral artery with serial 50% stenoses. Electronically Signed   By: Nelson Chimes M.D.   On: 12/29/2015 13:40   Mr Brain Wo Contrast  12/29/2015  CLINICAL DATA:  Development of dizziness in weakness beginning 12/26/2015. EXAM: MRI HEAD WITHOUT CONTRAST MRA HEAD WITHOUT CONTRAST MRA NECK WITHOUT CONTRAST TECHNIQUE: Multiplanar, multiecho pulse sequences of the brain and surrounding structures were obtained without intravenous contrast. Angiographic images of the Circle of Willis were obtained using MRA technique without intravenous contrast. Angiographic images of the neck were obtained using MRA technique without intravenous contrast. Carotid stenosis measurements (when applicable) are obtained utilizing NASCET criteria, using the distal internal carotid diameter as the denominator. COMPARISON:  Head CT 12/26/2015 FINDINGS: MRI HEAD FINDINGS Diffusion imaging does not show any acute or subacute infarction. There chronic small-vessel ischemic changes throughout the pons. No focal cerebellar insult. Cerebral hemispheres show old infarction in the  right PCA territory affecting the posterior medial temporal lobe and parietal lobe. There is an old left frontal cortical and subcortical infarction. Chronic small-vessel ischemic changes are present elsewhere throughout the cerebral hemispheric white matter. No mass lesion, recent hemorrhage, hydrocephalus or extra-axial collection. No pituitary mass. No inflammatory sinus disease. No skull or skullbase lesion. MRA HEAD FINDINGS Both internal carotid arteries are patent into the brain, but there is atherosclerotic narrowing and irregularity in both carotid siphon regions. The right internal carotid artery shows supraclinoid stenosis. This vessel supplies the right middle cerebral artery territory and a diseased A1 segment on the right. The left internal carotid artery supplies the left middle cerebral artery territory gives the majority of the supply to both anterior cerebral artery territories. There is some atherosclerotic narrowing and irregularity in the more distal branch vessels bilaterally. Both vertebral arteries are patent with the right being dominant. The left vertebral artery shows atherosclerotic narrowing and irregularity at the foramen magnum and in the distal vertebral artery. There are serial stenoses estimated at 50%. The basilar artery shows atherosclerotic narrowing and irregularity with maximal stenosis measuring 30%. Superior cerebellar and posterior cerebral vessels are patent, with atherosclerotic irregularity of the more distal PCA branches. MRA NECK FINDINGS Both common carotid arteries are widely patent to the bifurcation regions. Both carotid bifurcations are patent with mild atherosclerotic change. Narrowing in the right ICA bulb is estimated at 25%. There is no stenosis on the left. Both vertebral arteries are patent through the cervical region without stenosis. Proximal vessels/intra thoracic segments are not evaluated given the noncontrast technique. IMPRESSION: MRI head: No acute  insult. 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 Extensive chronic small vessel disease. MRA neck: No significant carotid bifurcation disease. Antegrade flow in both vertebral arteries. MRA head: No major vessel occlusion at this time. Extensive atherosclerotic narrowing and irregularity in both carotid siphon regions. Intracranial atherosclerotic disease diffusely affecting the more distal branch vessels as well as the A1 segment on the right. There is considerable atherosclerotic disease of the distal left vertebral artery with serial 50% stenoses. Electronically Signed   By: Nelson Chimes M.D.   On: 12/29/2015 13:40   Dg Chest Port 1 View  12/30/2015  CLINICAL DATA:  Acute onset of severe shortness of breath and  left-sided chest pain that began earlier today. Current history of hypertension, cardiomyopathy, end-stage renal disease. Prior MI and stroke. EXAM: PORTABLE CHEST 1 VIEW COMPARISON:  Portable chest x-ray 12/27/2015. Two-view chest x-ray 12/26/2015 and earlier. FINDINGS: Cardiac silhouette mildly to moderately enlarged for AP portable technique, unchanged. Thoracic aorta tortuous and atherosclerotic, unchanged. Hilar and mediastinal contours otherwise unremarkable. Minimal atelectasis at the lung bases. Lungs otherwise clear. Bronchovascular markings normal. No localized airspace consolidation. No pleural effusions. No pneumothorax. Normal pulmonary vascularity. IMPRESSION: Minimal bibasilar atelectasis. No acute cardiopulmonary disease otherwise. Stable cardiomegaly without pulmonary edema. Electronically Signed   By: Evangeline Dakin M.D.   On: 12/30/2015 15:19      Lab Results  Component Value Date   HGBA1C 4.9 04/04/2015   HGBA1C 5.2 07/07/2014   HGBA1C 5.4 10/08/2011   Lab Results  Component Value Date   LDLCALC 35 08/17/2015   CREATININE 9.36* 12/31/2015       Scheduled Meds: . aspirin  81 mg Oral Daily  . atorvastatin  40 mg Oral q1800  . calcium acetate  667  mg Oral TID WC  . carvedilol  12.5 mg Oral BID WC  . cloNIDine  0.1 mg Oral BID  . clopidogrel  75 mg Oral Daily  . gabapentin  100 mg Oral QHS  . [START ON 01/01/2016] heparin  2,000 Units Dialysis Once in dialysis  . heparin  5,000 Units Subcutaneous 3 times per day  . hydrALAZINE  50 mg Oral BID  . midodrine  5 mg Oral TID WC  . multivitamin  1 tablet Oral QHS  . pantoprazole  40 mg Oral Daily   Continuous Infusions:   Active Problems:   Nonischemic cardiomyopathy (HCC)   History of stroke   Chronic combined systolic and diastolic CHF (congestive heart failure) ([place])   Coronary artery disease, non-occlusive: Cath 2013 - Moderate D1 & RCA disease   ESRD on hemodialysis (HCC)   Protein-calorie malnutrition, severe (HCC)   Generalized weakness   Weakness generalized   Cough   ESRD (end stage renal disease) ([place])    Time spent: 45 minutes   [facility] Pager [phone]. If 7PM-7AM, please contact night-coverage at [email or web address], password [facility] 12/31/2015, 11:32 AM  LOS: 5 days

## 2015-12-31 NOTE — Clinical Social Work Note (Signed)
Patient has a bed at [facility]. Facility prepared to take patient today, 1/30.  CSW remains available as needed.   Glendon Axe, MSW, LCSWA [phone] 12/31/2015 2:53 PM

## 2015-12-31 NOTE — Clinical Social Work Note (Signed)
Clinical Social Worker notified that patient is NOT medically stable for transfer today. Facility notified. RNCM to notify patient at bedside.  Patient has a bed at [facility] once medically stable. CSW remains available as needed.  Glendon Axe, MSW, LCSWA [phone] 12/31/2015 3:26 PM

## 2015-12-31 NOTE — Clinical Social Work Placement (Signed)
   CLINICAL SOCIAL WORK PLACEMENT  NOTE  Date:  12/31/2015  Patient Details  Name: Trevor [name] MRN: [id number] Date of Birth: 1946/02/10  Clinical Social Work is seeking post-discharge placement for this patient at the [place] level of care (*CSW will initial, date and re-position this form in  chart as items are completed):  Yes   Patient/family provided with [place] Work Department's list of facilities offering this level of care within the geographic area requested by the patient (or if unable, by the patient's family).  Yes   Patient/family informed of their freedom to choose among providers that offer the needed level of care, that participate in Medicare, Medicaid or managed care program needed by the patient, have an available bed and are willing to accept the patient.  Yes   Patient/family informed of [facility] ownership interest in [facility] and [facility], as well as of the fact that they are under no obligation to receive care at these facilities.  PASRR submitted to EDS on       PASRR number received on       Existing PASRR number confirmed on 12/31/15     FL2 transmitted to all facilities in geographic area requested by pt/family on 12/31/15     FL2 transmitted to all facilities within larger geographic area on       Patient informed that his/her managed care company has contracts with or will negotiate with certain facilities, including the following:            Patient/family informed of bed offers received.  Patient chooses bed at       Physician recommends and patient chooses bed at      Patient to be transferred to   on  .  Patient to be transferred to facility by       Patient family notified on   of transfer.  Name of family member notified:        PHYSICIAN       Additional Comment:    _______________________________________________ Rozell Searing, LCSW 12/31/2015, 2:45 PM

## 2015-12-31 NOTE — Progress Notes (Signed)
PT Cancellation Note  Patient Details Name: Trevor [name] MRN: [id number] DOB: [birth year]   Cancelled Treatment:    Reason Eval/Treat Not Completed: Patient at procedure or test/unavailable. Will follow as able.    Cassell Clement, PT, CSCS Pager [phone] Office [phone]  12/31/2015, 10:32 AM

## 2016-01-01 ENCOUNTER — Inpatient Hospital Stay (HOSPITAL_COMMUNITY): Payer: Medicare Other

## 2016-01-01 LAB — TROPONIN I
TROPONIN I: 0.04 ng/mL — AB (ref <0.031)
Troponin I: 0.03 ng/mL (ref <0.031)
Troponin I: 0.03 ng/mL (ref <0.031)

## 2016-01-01 LAB — CULTURE, BLOOD (ROUTINE X 2)
CULTURE: NO GROWTH
Culture: NO GROWTH

## 2016-01-01 MED ORDER — NEPRO/CARBSTEADY PO LIQD
237.0000 mL | Freq: Two times a day (BID) | ORAL | Status: DC
  Administered 2016-01-01 – 2016-01-03 (×4): 237 mL via ORAL
  Filled 2016-01-01 (×8): qty 237

## 2016-01-01 MED ORDER — CLONIDINE HCL 0.1 MG PO TABS
0.1000 mg | ORAL_TABLET | Freq: Every day | ORAL | Status: DC
  Administered 2016-01-02: 0.1 mg via ORAL
  Filled 2016-01-01: qty 1

## 2016-01-01 MED ORDER — SEVELAMER CARBONATE 800 MG PO TABS
1600.0000 mg | ORAL_TABLET | Freq: Three times a day (TID) | ORAL | Status: DC
  Administered 2016-01-01 – 2016-01-03 (×4): 1600 mg via ORAL
  Filled 2016-01-01 (×4): qty 2

## 2016-01-01 NOTE — Care Management Important Message (Signed)
Important Message  Patient Details  Name: Trevor [name] MRN: [id number] Date of Birth: September 26, 1946   Medicare Important Message Given:  Yes    Barb Merino Kaushik Maul 01/01/2016, 3:29 PM

## 2016-01-01 NOTE — Progress Notes (Signed)
[facility] PROGRESS NOTE  Trevor [name] [id number] DOB: 1946/10/28 DOA: 12/26/2015 PCP: [name], MD  Length of stay: 6   Assessment/Plan: Active Problems:   Nonischemic cardiomyopathy ([place])   History of stroke   Chronic combined systolic and diastolic CHF (congestive heart failure) ([place])   Coronary artery disease, non-occlusive: Cath 2013 - Moderate D1 & RCA disease   ESRD on hemodialysis ([place])   Protein-calorie malnutrition, severe (HCC)   Generalized weakness   Weakness generalized   Cough   ESRD (end stage renal disease) ([place])     Brief summary 70 y.o. male ESRD dialyzing at [facility] with Dr. Marval Regal with a EDW of 62kg and presenting with generalized weakness which started after dialysis on Monday but worsened today. He also states that he has had subjective fevers and a productive cough. He is blind in the right eye and sees some shapes through the left eye; therefore was not able to see what he was coughing up. He also has had intermittent headaches which he states is new. He missed dialysis today because he was feeling weak; he is weaker on the right side but is able to ambulate with a walker + uses a wheelchair as well. He denies any nausea, vomiting, diarrhea, abdominal pain, sore throat or rhinorrhea. He also denies chest pain or palpitations. He admits to not taking his medications for a whole month and then restarted taking them on 1/24 after a home health nurse visited [facility].  Assessment and plan  Chest pain Rapid response this morning Serial chest x-ray shows cardiomegaly but no active disease, EKG unchanged, troponins negative Currently chest pain-free and would request PT to evaluate  Generalized weakness, likely related to orthostatic hypotension, systolic blood pressure dropped from 153>134 at 4 AM this morning   has some symptoms of a URI, cough, low-grade fever, chest x-ray negative, no signs of infection Blood culture no growth so far  times 2, flu PCR negative Lactic acid, ammonia, TSH free T4 within normal limits -It is possible that resuming all his home medicines yesterday after a long gap could be potentially contributing, - no focal neurological symptoms, no meningeal signs  PT/OT evaluation pending reduce  blood pressure medications, received a total of 3 L of normal saline with hemodialysis Patient has resting hypertension, [id number] to [id number] systolic MRI MRA of the brain to rule out CVA, vertebrobasilar insufficiency, cervical spinal stenosis? 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 (cont ASA, plavix ) Continue midodrine for orthostatic Hypotension, DC'd  flomax, proscar >18% incidence of orthostasis , discontinued a.m. clonidine, and stop clonidine during the day PT eval for vestibular rehabilitation  Nonischemic cardiomyopathy, combined chronic systolic diastolic On  0000000 EF was 55-60%, repeat 2-D echo on 1/27 shows EF of 45-50% -History of non-ST elevation MI 08/17/15 that showed an EF of 19%, He was advised to continue with aspirin Coreg and Plavix, statin, Currently Coreg is on hold,    Hypertension-currently on Coreg, hydralazine, clonidine, nephrology to titrate these further  ESRD on HD Monday Wednesday Friday Nephrology following    History of stroke - exam is nonfocal, CT unremarkable,MRI old CVA  Continue ASA and  Plavix    Chronic combined systolic and diastolic CHF (congestive heart failure)  -Volume management HD, continue Coreg    Protein-calorie malnutrition, severe ([place]) -RD consult   DVT prophylaxsis heparin  Code Status:      Code Status Orders  Start     Ordered   12/26/15 2046  Full code   Continuous     12/26/15 2045      Family Communication: Discussed in detail with the patient, all imaging results, lab results explained to the patient   Disposition Plan:  PT OT evaluation, pending, anticipate discharge when workup is completed       Consultants:  Nephrology  Procedures:  None  Antibiotics: Anti-infectives    None         HPI/Subjective:  Chest pain resolved,  Objective: Filed Vitals:   12/31/15 2119 01/01/16 0839 01/01/16 1100 01/01/16 1205  BP: 172/67 138/51    Pulse: 62 60    Temp: 97.7 F (36.5 C) 98.7 F (37.1 C)  98.1 F (36.7 C)  TempSrc: Oral Oral    Resp: 17     Height:      Weight:   65.273 kg (143 lb 14.4 oz)   SpO2: 96% 100%  100%   No intake or output data in the 24 hours ending 01/01/16 1334  Exam:  General: No acute respiratory distress Lungs: Clear to auscultation bilaterally without wheezes or crackles Cardiovascular: Regular rate and rhythm without murmur gallop or rub normal S1 and S2 Abdomen: Nontender, nondistended, soft, bowel sounds positive, no rebound, no ascites, no appreciable mass Extremities: No significant cyanosis, clubbing, or edema bilateral lower extremities     Data Review   Micro Results Recent Results (from the past 240 hour(s))  Urine culture     Status: None   Collection Time: 12/26/15  5:38 PM  Result Value Ref Range Status   Specimen Description URINE, CLEAN CATCH  Final   Special Requests NONE  Final   Culture MULTIPLE SPECIES PRESENT, SUGGEST RECOLLECTION  Final   Report Status 12/28/2015 FINAL  Final  Culture, blood (Routine X 2) w Reflex to ID Panel     Status: None   Collection Time: 12/27/15  1:30 PM  Result Value Ref Range Status   Specimen Description HEMODIALYSIS LINE  Final   Special Requests BOTTLES DRAWN AEROBIC AND ANAEROBIC 10MLS  Final   Culture NO GROWTH 5 DAYS  Final   Report Status 01/01/2016 FINAL  Final  Culture, blood (Routine X 2) w Reflex to ID Panel     Status: None   Collection Time: 12/27/15  1:40 PM  Result Value Ref Range Status   Specimen Description HEMODIALYSIS LINE  Final   Special Requests BOTTLES DRAWN AEROBIC AND ANAEROBIC 10MLS  Final   Culture NO GROWTH 5 DAYS  Final   Report Status 01/01/2016  FINAL  Final    Radiology Reports Dg Chest 2 View  12/26/2015  CLINICAL DATA:  Generalized weakness EXAM: CHEST  2 VIEW COMPARISON:  08/12/2015 FINDINGS: Cardiomegaly. Normal vascularity. New oval density over the right upper lung zone may reflect an overlying object. Minimal basilar hypoaeration change. IMPRESSION: Right upper lobe nodular density may reflect an overlying object. Repeat after removal of overlying objects is recommended. Cardiomegaly without edema. Electronically Signed   By: Marybelle Killings M.D.   On: 12/26/2015 14:04   Ct Head Wo Contrast  12/26/2015  CLINICAL DATA:  Generalized weakness EXAM: CT HEAD WITHOUT CONTRAST TECHNIQUE: Contiguous axial images were obtained from the base of the skull through the vertex without intravenous contrast. COMPARISON:  04/04/2015.  04/03/2015. FINDINGS: Encephalomalacia in the left frontal and right occipital lobes is stable. Chronic ischemic changes in the periventricular white matter. No mass effect, midline shift, or acute  hemorrhage. Mastoid air cells are clear. Visualized paranasal sinuses are clear. IMPRESSION: Chronic ischemic changes.  No acute intracranial pathology. Electronically Signed   By: Marybelle Killings M.D.   On: 12/26/2015 13:28   Mr Jodene Nam Head Wo Contrast  12/29/2015  CLINICAL DATA:  Development of dizziness in weakness beginning 12/26/2015. EXAM: MRI HEAD WITHOUT CONTRAST MRA HEAD WITHOUT CONTRAST MRA NECK WITHOUT CONTRAST TECHNIQUE: Multiplanar, multiecho pulse sequences of the brain and surrounding structures were obtained without intravenous contrast. Angiographic images of the Circle of Willis were obtained using MRA technique without intravenous contrast. Angiographic images of the neck were obtained using MRA technique without intravenous contrast. Carotid stenosis measurements (when applicable) are obtained utilizing NASCET criteria, using the distal internal carotid diameter as the denominator. COMPARISON:  Head CT 12/26/2015  FINDINGS: MRI HEAD FINDINGS Diffusion imaging does not show any acute or subacute infarction. There chronic small-vessel ischemic changes throughout the pons. No focal cerebellar insult. Cerebral hemispheres show old infarction in the right PCA territory affecting the posterior medial temporal lobe and parietal lobe. There is an old left frontal cortical and subcortical infarction. Chronic small-vessel ischemic changes are present elsewhere throughout the cerebral hemispheric white matter. No mass lesion, recent hemorrhage, hydrocephalus or extra-axial collection. No pituitary mass. No inflammatory sinus disease. No skull or skullbase lesion. MRA HEAD FINDINGS Both internal carotid arteries are patent into the brain, but there is atherosclerotic narrowing and irregularity in both carotid siphon regions. The right internal carotid artery shows supraclinoid stenosis. This vessel supplies the right middle cerebral artery territory and a diseased A1 segment on the right. The left internal carotid artery supplies the left middle cerebral artery territory gives the majority of the supply to both anterior cerebral artery territories. There is some atherosclerotic narrowing and irregularity in the more distal branch vessels bilaterally. Both vertebral arteries are patent with the right being dominant. The left vertebral artery shows atherosclerotic narrowing and irregularity at the foramen magnum and in the distal vertebral artery. There are serial stenoses estimated at 50%. The basilar artery shows atherosclerotic narrowing and irregularity with maximal stenosis measuring 30%. Superior cerebellar and posterior cerebral vessels are patent, with atherosclerotic irregularity of the more distal PCA branches. MRA NECK FINDINGS Both common carotid arteries are widely patent to the bifurcation regions. Both carotid bifurcations are patent with mild atherosclerotic change. Narrowing in the right ICA bulb is estimated at 25%. There  is no stenosis on the left. Both vertebral arteries are patent through the cervical region without stenosis. Proximal vessels/intra thoracic segments are not evaluated given the noncontrast technique. IMPRESSION: MRI head: No acute insult. 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 Extensive chronic small vessel disease. MRA neck: No significant carotid bifurcation disease. Antegrade flow in both vertebral arteries. MRA head: No major vessel occlusion at this time. Extensive atherosclerotic narrowing and irregularity in both carotid siphon regions. Intracranial atherosclerotic disease diffusely affecting the more distal branch vessels as well as the A1 segment on the right. There is considerable atherosclerotic disease of the distal left vertebral artery with serial 50% stenoses. Electronically Signed   By: Nelson Chimes M.D.   On: 12/29/2015 13:40   Mr Angiogram Neck Wo Contrast  12/29/2015  CLINICAL DATA:  Development of dizziness in weakness beginning 12/26/2015. EXAM: MRI HEAD WITHOUT CONTRAST MRA HEAD WITHOUT CONTRAST MRA NECK WITHOUT CONTRAST TECHNIQUE: Multiplanar, multiecho pulse sequences of the brain and surrounding structures were obtained without intravenous contrast. Angiographic images of the Circle of Willis were obtained using  MRA technique without intravenous contrast. Angiographic images of the neck were obtained using MRA technique without intravenous contrast. Carotid stenosis measurements (when applicable) are obtained utilizing NASCET criteria, using the distal internal carotid diameter as the denominator. COMPARISON:  Head CT 12/26/2015 FINDINGS: MRI HEAD FINDINGS Diffusion imaging does not show any acute or subacute infarction. There chronic small-vessel ischemic changes throughout the pons. No focal cerebellar insult. Cerebral hemispheres show old infarction in the right PCA territory affecting the posterior medial temporal lobe and parietal lobe. There is an old left frontal  cortical and subcortical infarction. Chronic small-vessel ischemic changes are present elsewhere throughout the cerebral hemispheric white matter. No mass lesion, recent hemorrhage, hydrocephalus or extra-axial collection. No pituitary mass. No inflammatory sinus disease. No skull or skullbase lesion. MRA HEAD FINDINGS Both internal carotid arteries are patent into the brain, but there is atherosclerotic narrowing and irregularity in both carotid siphon regions. The right internal carotid artery shows supraclinoid stenosis. This vessel supplies the right middle cerebral artery territory and a diseased A1 segment on the right. The left internal carotid artery supplies the left middle cerebral artery territory gives the majority of the supply to both anterior cerebral artery territories. There is some atherosclerotic narrowing and irregularity in the more distal branch vessels bilaterally. Both vertebral arteries are patent with the right being dominant. The left vertebral artery shows atherosclerotic narrowing and irregularity at the foramen magnum and in the distal vertebral artery. There are serial stenoses estimated at 50%. The basilar artery shows atherosclerotic narrowing and irregularity with maximal stenosis measuring 30%. Superior cerebellar and posterior cerebral vessels are patent, with atherosclerotic irregularity of the more distal PCA branches. MRA NECK FINDINGS Both common carotid arteries are widely patent to the bifurcation regions. Both carotid bifurcations are patent with mild atherosclerotic change. Narrowing in the right ICA bulb is estimated at 25%. There is no stenosis on the left. Both vertebral arteries are patent through the cervical region without stenosis. Proximal vessels/intra thoracic segments are not evaluated given the noncontrast technique. IMPRESSION: MRI head: No acute insult. 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 Extensive chronic small vessel disease. MRA neck:  No significant carotid bifurcation disease. Antegrade flow in both vertebral arteries. MRA head: No major vessel occlusion at this time. Extensive atherosclerotic narrowing and irregularity in both carotid siphon regions. Intracranial atherosclerotic disease diffusely affecting the more distal branch vessels as well as the A1 segment on the right. There is considerable atherosclerotic disease of the distal left vertebral artery with serial 50% stenoses. Electronically Signed   By: Nelson Chimes M.D.   On: 12/29/2015 13:40   Mr Brain Wo Contrast  12/29/2015  CLINICAL DATA:  Development of dizziness in weakness beginning 12/26/2015. EXAM: MRI HEAD WITHOUT CONTRAST MRA HEAD WITHOUT CONTRAST MRA NECK WITHOUT CONTRAST TECHNIQUE: Multiplanar, multiecho pulse sequences of the brain and surrounding structures were obtained without intravenous contrast. Angiographic images of the Circle of Willis were obtained using MRA technique without intravenous contrast. Angiographic images of the neck were obtained using MRA technique without intravenous contrast. Carotid stenosis measurements (when applicable) are obtained utilizing NASCET criteria, using the distal internal carotid diameter as the denominator. COMPARISON:  Head CT 12/26/2015 FINDINGS: MRI HEAD FINDINGS Diffusion imaging does not show any acute or subacute infarction. There chronic small-vessel ischemic changes throughout the pons. No focal cerebellar insult. Cerebral hemispheres show old infarction in the right PCA territory affecting the posterior medial temporal lobe and parietal lobe. There is an old left frontal cortical  and subcortical infarction. Chronic small-vessel ischemic changes are present elsewhere throughout the cerebral hemispheric white matter. No mass lesion, recent hemorrhage, hydrocephalus or extra-axial collection. No pituitary mass. No inflammatory sinus disease. No skull or skullbase lesion. MRA HEAD FINDINGS Both internal carotid arteries are  patent into the brain, but there is atherosclerotic narrowing and irregularity in both carotid siphon regions. The right internal carotid artery shows supraclinoid stenosis. This vessel supplies the right middle cerebral artery territory and a diseased A1 segment on the right. The left internal carotid artery supplies the left middle cerebral artery territory gives the majority of the supply to both anterior cerebral artery territories. There is some atherosclerotic narrowing and irregularity in the more distal branch vessels bilaterally. Both vertebral arteries are patent with the right being dominant. The left vertebral artery shows atherosclerotic narrowing and irregularity at the foramen magnum and in the distal vertebral artery. There are serial stenoses estimated at 50%. The basilar artery shows atherosclerotic narrowing and irregularity with maximal stenosis measuring 30%. Superior cerebellar and posterior cerebral vessels are patent, with atherosclerotic irregularity of the more distal PCA branches. MRA NECK FINDINGS Both common carotid arteries are widely patent to the bifurcation regions. Both carotid bifurcations are patent with mild atherosclerotic change. Narrowing in the right ICA bulb is estimated at 25%. There is no stenosis on the left. Both vertebral arteries are patent through the cervical region without stenosis. Proximal vessels/intra thoracic segments are not evaluated given the noncontrast technique. IMPRESSION: MRI head: No acute insult. 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 Extensive chronic small vessel disease. MRA neck: No significant carotid bifurcation disease. Antegrade flow in both vertebral arteries. MRA head: No major vessel occlusion at this time. Extensive atherosclerotic narrowing and irregularity in both carotid siphon regions. Intracranial atherosclerotic disease diffusely affecting the more distal branch vessels as well as the A1 segment on the right. There  is considerable atherosclerotic disease of the distal left vertebral artery with serial 50% stenoses. Electronically Signed   By: Nelson Chimes M.D.   On: 12/29/2015 13:40   Dg Chest Port 1 View  01/01/2016  CLINICAL DATA:  Low back pain, chest pain at rest EXAM: PORTABLE CHEST 1 VIEW COMPARISON:  12/30/2015 FINDINGS: Cardiomegaly. Lungs are clear. No effusions. No acute bony abnormality. IMPRESSION: Cardiomegaly.  No active disease. Electronically Signed   By: Rolm Baptise M.D.   On: 01/01/2016 09:31   Dg Chest Port 1 View  12/30/2015  CLINICAL DATA:  Acute onset of severe shortness of breath and left-sided chest pain that began earlier today. Current history of hypertension, cardiomyopathy, end-stage renal disease. Prior MI and stroke. EXAM: PORTABLE CHEST 1 VIEW COMPARISON:  Portable chest x-ray 12/27/2015. Two-view chest x-ray 12/26/2015 and earlier. FINDINGS: Cardiac silhouette mildly to moderately enlarged for AP portable technique, unchanged. Thoracic aorta tortuous and atherosclerotic, unchanged. Hilar and mediastinal contours otherwise unremarkable. Minimal atelectasis at the lung bases. Lungs otherwise clear. Bronchovascular markings normal. No localized airspace consolidation. No pleural effusions. No pneumothorax. Normal pulmonary vascularity. IMPRESSION: Minimal bibasilar atelectasis. No acute cardiopulmonary disease otherwise. Stable cardiomegaly without pulmonary edema. Electronically Signed   By: Evangeline Dakin M.D.   On: 12/30/2015 15:19   Dg Chest Port 1 View  12/27/2015  CLINICAL DATA:  Productive cough for 2 days, weakness - hx of MI, stroke, htn, SOB EXAM: PORTABLE CHEST 1 VIEW COMPARISON:  12/25/2013 FINDINGS: The heart size and mediastinal contours are within normal limits. Both lungs are clear. The visualized skeletal structures are  unremarkable. IMPRESSION: No active disease. Electronically Signed   By: Nolon Nations M.D.   On: 12/27/2015 12:21     CBC  Recent Labs Lab  12/26/15 1135 12/27/15 0505 12/28/15 0350 12/31/15 0740  WBC 4.5 5.2 4.0 4.0  HGB 12.8* 10.8* 9.8* 9.7*  HCT 38.7* 32.7* 30.1* 28.1*  PLT 168 159 131* 123*  MCV 90.2 90.1 90.4 90.4  MCH 29.8 29.8 29.4 31.2  MCHC 33.1 33.0 32.6 34.5  RDW 17.4* 17.2* 16.9* 16.6*    Chemistries   Recent Labs Lab 12/26/15 1135 12/27/15 0505 12/28/15 0350 12/31/15 0740  NA 140 140 140 139  K 4.5 4.9 4.1 4.8  CL 100* 100* 101 103  CO2 25 25 29 24   GLUCOSE 148* 81 79 84  BUN 34* 47* 21* 38*  CREATININE 9.33* 10.66* 6.23* 9.36*  CALCIUM 9.6 8.8* 8.7* 9.3  AST  --  9* 11*  --   ALT  --  8* 6*  --   ALKPHOS  --  74 66  --   BILITOT  --  0.7 0.5  --    ------------------------------------------------------------------------------------------------------------------ estimated creatinine clearance is 6.8 mL/min (by C-G formula based on Cr of 9.36). ------------------------------------------------------------------------------------------------------------------ No results for input(s): HGBA1C in the last 72 hours. ------------------------------------------------------------------------------------------------------------------ No results for input(s): CHOL, HDL, LDLCALC, TRIG, CHOLHDL, LDLDIRECT in the last 72 hours. ------------------------------------------------------------------------------------------------------------------ No results for input(s): TSH, T4TOTAL, T3FREE, THYROIDAB in the last 72 hours.  Invalid input(s): FREET3 ------------------------------------------------------------------------------------------------------------------ No results for input(s): VITAMINB12, FOLATE, FERRITIN, TIBC, IRON, RETICCTPCT in the last 72 hours.  Coagulation profile No results for input(s): INR, PROTIME in the last 168 hours.  No results for input(s): DDIMER in the last 72 hours.  Cardiac Enzymes  Recent Labs Lab 01/01/16 1112  TROPONINI 0.04*    ------------------------------------------------------------------------------------------------------------------ Invalid input(s): POCBNP   CBG: No results for input(s): GLUCAP in the last 168 hours.     Studies: Dg Chest Port 1 View  01/01/2016  CLINICAL DATA:  Low back pain, chest pain at rest EXAM: PORTABLE CHEST 1 VIEW COMPARISON:  12/30/2015 FINDINGS: Cardiomegaly. Lungs are clear. No effusions. No acute bony abnormality. IMPRESSION: Cardiomegaly.  No active disease. Electronically Signed   By: Rolm Baptise M.D.   On: 01/01/2016 09:31   Dg Chest Port 1 View  12/30/2015  CLINICAL DATA:  Acute onset of severe shortness of breath and left-sided chest pain that began earlier today. Current history of hypertension, cardiomyopathy, end-stage renal disease. Prior MI and stroke. EXAM: PORTABLE CHEST 1 VIEW COMPARISON:  Portable chest x-ray 12/27/2015. Two-view chest x-ray 12/26/2015 and earlier. FINDINGS: Cardiac silhouette mildly to moderately enlarged for AP portable technique, unchanged. Thoracic aorta tortuous and atherosclerotic, unchanged. Hilar and mediastinal contours otherwise unremarkable. Minimal atelectasis at the lung bases. Lungs otherwise clear. Bronchovascular markings normal. No localized airspace consolidation. No pleural effusions. No pneumothorax. Normal pulmonary vascularity. IMPRESSION: Minimal bibasilar atelectasis. No acute cardiopulmonary disease otherwise. Stable cardiomegaly without pulmonary edema. Electronically Signed   By: Evangeline Dakin M.D.   On: 12/30/2015 15:19      Lab Results  Component Value Date   HGBA1C 4.9 04/04/2015   HGBA1C 5.2 07/07/2014   HGBA1C 5.4 10/08/2011   Lab Results  Component Value Date   LDLCALC 35 08/17/2015   CREATININE 9.36* 12/31/2015       Scheduled Meds: . aspirin  81 mg Oral Daily  . atorvastatin  40 mg Oral q1800  . carvedilol  12.5 mg Oral BID WC  . [START ON  01/02/2016] cloNIDine  0.1 mg Oral QHS  . clopidogrel   75 mg Oral Daily  . feeding supplement (NEPRO CARB STEADY)  237 mL Oral BID BM  . gabapentin  100 mg Oral QHS  . heparin  5,000 Units Subcutaneous 3 times per day  . hydrALAZINE  50 mg Oral BID  . midodrine  5 mg Oral TID WC  . multivitamin  1 tablet Oral QHS  . pantoprazole  40 mg Oral Daily  . polyethylene glycol  17 g Oral BID  . sevelamer carbonate  1,600 mg Oral TID WC   Continuous Infusions:   Active Problems:   Nonischemic cardiomyopathy (HCC)   History of stroke   Chronic combined systolic and diastolic CHF (congestive heart failure) ([place])   Coronary artery disease, non-occlusive: Cath 2013 - Moderate D1 & RCA disease   ESRD on hemodialysis ([facility]CC)   Protein-calorie malnutrition, severe (HCC)   Generalized weakness   Weakness generalized   Cough   ESRD (end stage renal disease) ([place])    Time spent: 45 minutes   [facility] Pager [phone]. If 7PM-7AM, please contact night-coverage at [email or web address], password [facility] 01/01/2016, 1:34 PM  LOS: 6 days

## 2016-01-01 NOTE — Progress Notes (Signed)
OT Cancellation Note  Patient Details Name: Trevor [name] MRN: [id number] DOB: [birth year]   Cancelled Treatment:    Reason Eval/Treat Not Completed: Medical issues which prohibited therapy. Per RN, rapid response was called this AM due to pt having chest pain and SOB.RN states that RR RN did not want pt to get up OOB right now. OT will check back tomorrow to see if pt more medically stable.   Redmond Baseman, OTR/L Pager: [phone] 01/01/2016, 11:09 AM

## 2016-01-01 NOTE — Significant Event (Signed)
Rapid Response Event Note  Overview:  Called to see patient c/o "baby elephant" on my chest Time Called: 0840 Arrival Time: 0850 Event Type: Cardiac  Initial Focused Assessment: On my arrival lying quietly in bed - RR reg and unlabored - warm and dry - arouses to voice- denies pressure at this time - denies SOB, n & v, - states he was sitting in chair - ready to go back to bed - with standing felt pressure - relieved when back to bed - abd soft no pain - no JVD noted - bil BS = clear - no pedal or leg edema.  12 Lead EKG reviewed - no changes from previous noted.  138/51 HR 63 pulse regular RR 18 O2 sats 100% on 2 liter nasal cannula.     Interventions:  Stat 12 lead EKG.  Dr. Allyson Sabal returned call - spoke with her with update - she reports no changes on 12 lead. MD ordered Troponins and PCXR.  Placed on telemetry.  PCXR done.   Continues pain free at this point - handoff to RN - to call as needed.  Will follow labs   Event Summary: Name of Physician Notified: Dr. Allyson Sabal at  (pta RRT)    at    Outcome: Stayed in room and stabalized  Event End Time: 0910  Quin Hoop

## 2016-01-01 NOTE — Progress Notes (Signed)
PT Cancellation Note  Patient Details Name: Trevor [name] MRN: [id number] DOB: 26-Oct-1946   Cancelled Treatment:    Reason Eval/Treat Not Completed: Medical issues which prohibited therapy.  Per RN, rapid response was called this AM due to pt having chest pain and SOB.  RN states that RR RN did not want pt to get up OOB right now.  PT will check back tomorrow to see if pt more medically stable.   Thanks,    Barbarann Ehlers. [place], [place], DPT [phone]   01/01/2016, 10:43 AM

## 2016-01-01 NOTE — Clinical Social Work Note (Signed)
Clinical Social Work Assessment  Patient Details  Name: Trevor [name] MRN: [id number] Date of Birth: [birth year]  Date of referral:  12/31/2015           Reason for consult:  Facility Placement, Discharge Planning                Permission sought to share information with:  Case Manager, Family Supports, Customer service manager Permission granted to share information::  Yes, Verbal Permission Granted  Name::      Truman Hayward Rubert)  Agency::   ([name]'s )  Relationship::   (Son)  Contact Information:   [phone])  Housing/Transportation Living arrangements for the past 2 months:  Single Family Home Source of Information:  Patient Patient Interpreter Needed:  None Criminal Activity/Legal Involvement Pertinent to Current Situation/Hospitalization:  No - Comment as needed Significant Relationships:  Adult Children, Friend Lives with:  Self Do you feel safe going back to the place where you live?  No Need for family participation in patient care:  No (Coment)  Care giving concerns:  Patient requiring short-term rehab at skilled level. Patient also reports that family is busy and he would not have 24 hour assistance if he returned home.   Social Worker assessment / plan:  Holiday representative met with patient at bedside in reference to [name] placement. CSW introduced CSW role and [name] process. Pt agreeable to staying at [name] for mininum of 30 days if being admitted under Medicaid, however patient status confirmed under inpatient. Pt shared that he receives dialysis at [facility] MWF. Pt has accepted bed offer at [facility] and [facility]. Patient currently not medically stable for discharge due to blood pressure declining. Pt asked that CSW contact son, Truman Hayward at discharge. No further concerns reported by patient at this time. CSW remains available as needed.   Employment status:  Retired Forensic scientist:  Information systems manager, Medicaid In [place] PT Recommendations:   [place] / Referral to community resources:  [place]  Patient/Family's Response to care:  Pt a/o x4. Pt agreeable to [place]. Pt polite, pleasant and appreciated social work intervention.   Patient/Family's Understanding of and Emotional Response to Diagnosis, Current Treatment, and Prognosis:  Pt knowledgeable of medical work up and on going treatment.   Emotional Assessment Appearance:  Appears stated age Attitude/Demeanor/Rapport:   (Pleasant ) Affect (typically observed):  Accepting, Appropriate, Pleasant, Calm Orientation:  Oriented to Self, Oriented to Place, Oriented to  Time, Oriented to Situation Alcohol / Substance use:  Not Applicable Psych involvement (Current and /or in the community):  No (Comment)  Discharge Needs  Concerns to be addressed:  Care Coordination Readmission within the last 30 days:  No Current discharge risk:  Dependent with Mobility, Lack of support system Barriers to Discharge:  Continued Medical Work up   [employer], MSW, [place] [phone]

## 2016-01-01 NOTE — Progress Notes (Signed)
Physical Therapy Treatment/Vestibular Assessment Patient Details Name: Trevor [name] MRN: [id number] DOB: 1946-03-15 Today's Date: 01/01/2016    History of Present Illness 70 y.o. male admitted for generalized weakness beginning after dialysis on 12/24/15 and also had subjective fevers and a productive cough. 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    PT Comments    Limited Vestibular assessment completed.  Difficult assessment due to pt's baseline visual deficits.  He does seem to have vestibular/vertigo symptoms, but they were not BPPV.  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  He has started to implement compensatory strategies to help keep him safe while mobilizing, but continues to be significantly more weak than his baseline.  I continue to recommend [name] placement for rehab at discharge.    Follow Up Recommendations  [name]     Equipment Recommendations  None recommended by PT    Recommendations for Other Services   NA     Precautions / Restrictions Precautions Precautions: Fall Precaution Comments: Fall onto Rt hand.  Patient also reports it is taking him longer to "recover" after HD.  Patient blind in Rt eye and sees only shapes in Lt eye.    Mobility  Bed Mobility Overal bed mobility: Modified Independent Bed Mobility: Rolling;Supine to Sit;Sit to Supine Rolling: Modified independent (Device/Increase time)   Supine to sit: Modified independent (Device/Increase time) Sit to supine: Modified independent (Device/Increase time)   General bed mobility comments: Pt using railing for leverage to get to sitting EOB.  Pt gets dizzy with these transitions.   Transfers Overall transfer level: Needs assistance Equipment used: Rolling walker (2 wheeled)   Sit to Stand: Min assist         General transfer comment:  Min assist to steady trunk due to some postural sway in standing.  Pt requires a minute in sitting and in standing to let the "spinning" settle.    Ambulation/Gait Ambulation/Gait assistance: Min assist Ambulation Distance (Feet): 120 Feet Assistive device: Rolling walker (2 wheeled) Gait Pattern/deviations: Step-through pattern;Shuffle;Trunk flexed Gait velocity: decreased Gait velocity interpretation: Below normal speed for age/gender General Gait Details: Pt with slow gait with RW.  He does reports some mild sensation of dizziness (which he reports as spinning), but is able to continue.  Due to low vision (blind in right eye and only shapes/shades in left eye) it is hard to use target compensation with him.           Balance Overall balance assessment: Needs assistance Sitting-balance support: Feet supported Sitting balance-Leahy Scale: Fair Sitting balance - Comments: some postural sway in initial sitting, again due to reports of spinning.    Standing balance support: Bilateral upper extremity supported Standing balance-Leahy Scale: Poor Standing balance comment: some postural sway again upon initial standing.  Pt is able to stabilize with RW and min assist.  Dizziness decreases in < 1 min                    Cognition Arousal/Alertness: Awake/alert Behavior During Therapy: WFL for tasks assessed/performed Overall Cognitive Status: Within Functional Limits for tasks assessed                         General Comments General comments (skin integrity, edema, etc.): See vestibular assessment for details.  Education on compensatory strategies reviewed with  pt including sitting and standing still for a minute before proceeding to walk or stand.       Pertinent Vitals/Pain Pain Assessment: No/denies pain Pain Score: 0-No pain       Vestibular Assessment:   01/01/16 2309  Vestibular Assessment  General Observation Pt with R eye blindness, Left eye can only  see shades and shapes per pt report.  No h/o tinnitus, fullness, no recent head injury, antibiotic use, no recent upper respiratory infection.    Symptom Behavior  Type of Dizziness Spinning  Frequency of Dizziness when he sits up and stands up  Duration of Dizziness <1 min  Aggravating Factors Activity in general  Relieving Factors Lying supine  Occulomotor Exam  Occulomotor Alignment Normal  Spontaneous Absent  Gaze-induced Absent  Smooth Pursuits Comment  Saccades Comment  Comment Difficult to assess due to low vision  Vestibulo-Occular Reflex  VOR 1 Head Only (x 1 viewing) attempted horizontal and vertical VOR testing with large target.  Difficult to assess due to low vision.  Pt reports that he feels like he is going to "black out" when he looks up and extends his neck.    Auditory  Comments left ear with decreased hearing, but this was present PTA  Positional Testing  Dix-Hallpike Dix-Hallpike Right;Dix-Hallpike Left  Sidelying Test Sidelying Right;Sidelying Left  Horizontal Canal Testing Horizontal Canal Right;Horizontal Canal Left  Dix-Hallpike Right  Dix-Hallpike Right Duration 0  Dix-Hallpike Right Symptoms No nystagmus  Dix-Hallpike Left  Dix-Hallpike Left Duration 0  Dix-Hallpike Left Symptoms No nystagmus  Sidelying Right  Sidelying Right Duration 0  Sidelying Right Symptoms No nystagmus  Sidelying Left  Sidelying Left Duration 0  Sidelying Left Symptoms No nystagmus  Horizontal Canal Right  Horizontal Canal Right Duration 0  Horizontal Canal Right Symptoms Normal  Horizontal Canal Left  Horizontal Canal Left Duration 0  Horizontal Canal Left Symptoms Normal;Other (comment) (pt reports he feels like he is going to black out)  Cognition  Cognition Orientation Level Appropriate for developmental age      PT Goals (current goals can now be found in the care plan section) Acute Rehab PT Goals Patient Stated Goal: to get stronger before I go home Progress  towards PT goals: Progressing toward goals    Frequency  Min 2X/week    PT Plan Frequency needs to be updated       End of Session   Activity Tolerance: Patient limited by fatigue Patient left: in bed;with call bell/phone within reach     Time: [id number] PT Time Calculation (min) (ACUTE ONLY): 29 min  Charges:  $Gait Training: 8-22 mins $Therapeutic Activity: 8-22 mins                      Taylor Levick B. [place], [place], DPT [phone]   01/01/2016, 11:16 PM

## 2016-01-01 NOTE — Progress Notes (Signed)
At [phone] Received a call that pt was c/o "having difficulty breathing" and that it felt like a "baby elephant" was on his chest. Took a set of vitals and called RR nurse. Received orders for a STAT EKG. Pt's breathing was unlabored and he denied any other symptoms. Paged Dr. Allyson Sabal and received orders for a STAT troponin and CXR, as well as to place the pt on cardiac telemetry. RR nurse arrived around [phone] and assessed the pt. After about 15 min the pt stated that the pressure was resolving, but that he "still didn't feel right". He stated that this feeling has happened before while at home and when it does he normally calls the ambulance because he doesn't know what else to do. Pt is currently resting peacefully with no further complaints. Will continue to monitor.     01/01/16 0839  Vitals  Temp 98.7 F (37.1 C)  Temp Source Oral  BP (!) 138/51 mmHg  BP Location Right Arm  BP Method Automatic  Patient Position (if appropriate) Lying  Pulse Rate 60  Pulse Rate Source Dinamap  Oxygen Therapy  SpO2 100 %  O2 Device Nasal Cannula  O2 Flow Rate (L/min) 2 L/min

## 2016-01-01 NOTE — Care Management Note (Signed)
Case Management Note  Patient Details  Name: Trevor [name] MRN: [id number] Date of Birth: [birth year]  Subjective/Objective:        Admitted with generalized weakness            Action/Plan: PT recommending [name]. Patient agreeable with [name], referral made to CSW and CSW has found bed at [facility]. Plan is for discharge to [place] when medically ready. Notified [name] with [facility] of change in discharge plan. Will continue to follow for d/c needs.  Expected Discharge Date:                  Expected Discharge Plan:  Skilled Nursing Facility  In-House Referral:  Clinical Social Work  Discharge planning Services  CM Consult  Post Acute Care Choice:  NA Choice offered to:  Patient  DME Arranged:  N/A DME Agency:  NA  HH Arranged:  NA HH Agency:     Status of Service:  In process, will continue to follow  Medicare Important Message Given:  Yes Date Medicare IM Given:    Medicare IM give by:    Date Additional Medicare IM Given:    Additional Medicare Important Message give by:     If discussed at [place] of Stay Meetings, dates discussed:    Additional Comments:  Nila Nephew, RN 01/01/2016, 3:14 PM

## 2016-01-01 NOTE — Progress Notes (Signed)
[facility] Progress Note  Assessment/Plan: 1 Orthostatic lightheadness - head CT neg, ECHO normal except for EF 40-45%. Gave 3L NS and didn't improve his symptoms at all. EKG/ trop's neg on admission. MRI/ MRA brain unremarkable. Patient had been without BP medications for several weeks due to not having anyone to fill his medication planner-was extremely hypertensive when last seen in HD center 12/24/15- so could be just getting used to taking meds again ?  Or just some deconditioning as well. Midodrine has been added- I also am going to stop his AM clonidine and just give it at night.  Will go to [name] upon DC. Believe this is best option for his care. PT seeing pt while inpatient.  2. ESRD - MWF at [facility]. For HD tomorrow.  3. Anemia - HGB 9.7. Last ESA dose 12/19/15. Give Aranesp tomorrow. Follow HBG.  4. Secondary hyperparathyroidism - Ca 9.3 C Ca 10.5 Change binders to renvela.  5. HTN/volume - BP highish when lying but does g down wth sitting or standing. Post wt 62.2 kg-at OP EDW. BP stable during HD. Wt today not done-needs daily wts. Will attempt UFG 1-1.5 liters tomorrow. SB on monitor, rate 56. May need coreg dose decreased. Stopping clonidine during the day may help- his HR usually goes up when he is standing so dont think this is only problem.  We may need to tolerate a higher BP sitting understanding that it goes down when he stands 6. Nutrition - Albumin 2.5. Renal diet. Add nepro/renal vits  Disposition: [place] and Rehab once medically stable  [name] NP-C 01/01/2016, 10:19 AM  [facility] [phone]  Patient seen and examined, agree with above note with above modifications. Now with information that he had been out of his meds and very hypertensive recently- so now that he is on meds he is symptomatic- he agrees that this must be it.  The one change I am making is to stop his AM clonidine to see if that helps.  HD tomorrow- I  encouraged him to participate with therapy as that will help as well  Corliss Parish, MD 01/01/2016     Subjective: "I'm just real, real weak! You know I believe all this started Monday after I left dialysis. I was just wrung out". In bed, NAD. Denies dizziness as long as pt is lying flat. Had CP this AM??   Objective Filed Vitals:   12/31/15 1702 12/31/15 1836 12/31/15 2119 01/01/16 0839  BP: 155/58 155/55 172/67 138/51  Pulse: 60 57 62 60  Temp:  98.1 F (36.7 C) 97.7 F (36.5 C) 98.7 F (37.1 C)  TempSrc:  Oral Oral Oral  Resp:  16 17   Height:      Weight:      SpO2:  98% 96% 100%   Physical Exam General: Thin male, cooperative, NAD.  Heart:  S1, S2, soft II/VI M. SB on monitor Lungs: Bilateral breath sounds CTA A/P Abdomen: soft nontender with active BS Extremities: No LE edema.  Dialysis Access:  LUA AVF + thrill + Bruit  Dialysis Orders: [facility] MonWedFri, 4 hrs 0 min, 180NRe Optiflux, BFR 400, DFR Autoflow 1.5, EDW 62 (kg), Dialysate 2.0 K, 2.25 Ca, UFR Profile: Profile 4, Sodium Model: None, Access: LUA AV Fistula Heparin: 1900 Units per treatment Mircera: 50 mcg IV q 2 weeks (last dose 12/19/15)  Additional Objective Labs: Basic Metabolic Panel:  Recent Labs Lab 12/27/15 0505 12/28/15 0350 12/31/15 0740  NA 140 140  139  K 4.9 4.1 4.8  CL 100* 101 103  CO2 25 29 24   GLUCOSE 81 79 84  BUN 47* 21* 38*  CREATININE 10.66* 6.23* 9.36*  CALCIUM 8.8* 8.7* 9.3   Liver Function Tests:  Recent Labs Lab 12/27/15 0505 12/28/15 0350  AST 9* 11*  ALT 8* 6*  ALKPHOS 74 66  BILITOT 0.7 0.5  PROT 6.0* 5.6*  ALBUMIN 2.8* 2.5*   No results for input(s): LIPASE, AMYLASE in the last 168 hours. CBC:  Recent Labs Lab 12/26/15 1135 12/27/15 0505 12/28/15 0350 12/31/15 0740  WBC 4.5 5.2 4.0 4.0  HGB 12.8* 10.8* 9.8* 9.7*  HCT 38.7* 32.7* 30.1* 28.1*  MCV 90.2 90.1 90.4 90.4  PLT 168 159 131* 123*   Blood Culture    Component Value Date/Time    SDES HEMODIALYSIS LINE 12/27/2015 1340   SPECREQUEST BOTTLES DRAWN AEROBIC AND ANAEROBIC 10MLS 12/27/2015 1340   CULT NO GROWTH 4 DAYS 12/27/2015 1340   REPTSTATUS PENDING 12/27/2015 1340    Cardiac Enzymes: No results for input(s): CKTOTAL, CKMB, CKMBINDEX, TROPONINI in the last 168 hours. CBG: No results for input(s): GLUCAP in the last 168 hours. Iron Studies: No results for input(s): IRON, TIBC, TRANSFERRIN, FERRITIN in the last 72 hours. @lablastinr3 @ Studies/Results: Dg Chest Port 1 View  01/01/2016  CLINICAL DATA:  Low back pain, chest pain at rest EXAM: PORTABLE CHEST 1 VIEW COMPARISON:  12/30/2015 FINDINGS: Cardiomegaly. Lungs are clear. No effusions. No acute bony abnormality. IMPRESSION: Cardiomegaly.  No active disease. Electronically Signed   By: Rolm Baptise M.D.   On: 01/01/2016 09:31   Dg Chest Port 1 View  12/30/2015  CLINICAL DATA:  Acute onset of severe shortness of breath and left-sided chest pain that began earlier today. Current history of hypertension, cardiomyopathy, end-stage renal disease. Prior MI and stroke. EXAM: PORTABLE CHEST 1 VIEW COMPARISON:  Portable chest x-ray 12/27/2015. Two-view chest x-ray 12/26/2015 and earlier. FINDINGS: Cardiac silhouette mildly to moderately enlarged for AP portable technique, unchanged. Thoracic aorta tortuous and atherosclerotic, unchanged. Hilar and mediastinal contours otherwise unremarkable. Minimal atelectasis at the lung bases. Lungs otherwise clear. Bronchovascular markings normal. No localized airspace consolidation. No pleural effusions. No pneumothorax. Normal pulmonary vascularity. IMPRESSION: Minimal bibasilar atelectasis. No acute cardiopulmonary disease otherwise. Stable cardiomegaly without pulmonary edema. Electronically Signed   By: Evangeline Dakin M.D.   On: 12/30/2015 15:19   Medications:   . aspirin  81 mg Oral Daily  . atorvastatin  40 mg Oral q1800  . calcium acetate  667 mg Oral TID WC  . carvedilol  12.5 mg  Oral BID WC  . cloNIDine  0.1 mg Oral BID  . clopidogrel  75 mg Oral Daily  . gabapentin  100 mg Oral QHS  . heparin  5,000 Units Subcutaneous 3 times per day  . hydrALAZINE  50 mg Oral BID  . midodrine  5 mg Oral TID WC  . multivitamin  1 tablet Oral QHS  . pantoprazole  40 mg Oral Daily  . polyethylene glycol  17 g Oral BID

## 2016-01-02 DIAGNOSIS — R079 Chest pain, unspecified: Secondary | ICD-10-CM

## 2016-01-02 DIAGNOSIS — I5042 Chronic combined systolic (congestive) and diastolic (congestive) heart failure: Secondary | ICD-10-CM

## 2016-01-02 LAB — RENAL FUNCTION PANEL
Albumin: 2.8 g/dL — ABNORMAL LOW (ref 3.5–5.0)
Anion gap: 10 (ref 5–15)
BUN: 34 mg/dL — ABNORMAL HIGH (ref 6–20)
CO2: 26 mmol/L (ref 22–32)
Calcium: 9 mg/dL (ref 8.9–10.3)
Chloride: 102 mmol/L (ref 101–111)
Creatinine, Ser: 8.61 mg/dL — ABNORMAL HIGH (ref 0.61–1.24)
GFR calc Af Amer: 6 mL/min — ABNORMAL LOW (ref >60)
GFR calc non Af Amer: 5 mL/min — ABNORMAL LOW (ref >60)
Glucose, Bld: 82 mg/dL (ref 65–99)
Phosphorus: 5.9 mg/dL — ABNORMAL HIGH (ref 2.5–4.6)
Potassium: 5 mmol/L (ref 3.5–5.1)
Sodium: 138 mmol/L (ref 135–145)

## 2016-01-02 LAB — CBC
HCT: 27.9 % — ABNORMAL LOW (ref 39.0–52.0)
Hemoglobin: 9.5 g/dL — ABNORMAL LOW (ref 13.0–17.0)
MCH: 30.7 pg (ref 26.0–34.0)
MCHC: 34.1 g/dL (ref 30.0–36.0)
MCV: 90.3 fL (ref 78.0–100.0)
Platelets: 136 K/uL — ABNORMAL LOW (ref 150–400)
RBC: 3.09 MIL/uL — ABNORMAL LOW (ref 4.22–5.81)
RDW: 16 % — ABNORMAL HIGH (ref 11.5–15.5)
WBC: 4 10*3/uL (ref 4.0–10.5)

## 2016-01-02 MED ORDER — HEPARIN SODIUM (PORCINE) 1000 UNIT/ML DIALYSIS: 1000.0000 [IU] | INTRAMUSCULAR | Status: DC | PRN

## 2016-01-02 MED ORDER — DARBEPOETIN ALFA 100 MCG/0.5ML IJ SOSY
100.0000 ug | PREFILLED_SYRINGE | INTRAMUSCULAR | Status: DC
  Administered 2016-01-02: 100 ug via INTRAVENOUS

## 2016-01-02 MED ORDER — HEPARIN SODIUM (PORCINE) 1000 UNIT/ML DIALYSIS
1900.0000 [IU] | Freq: Once | INTRAMUSCULAR | Status: AC
  Administered 2016-01-02: 1900 [IU] via INTRAVENOUS_CENTRAL

## 2016-01-02 MED ORDER — SODIUM CHLORIDE 0.9 % IV SOLN: 100.0000 mL | INTRAVENOUS | Status: DC | PRN

## 2016-01-02 MED ORDER — ALTEPLASE 2 MG IJ SOLR: 2.0000 mg | Freq: Once | INTRAMUSCULAR | Status: DC | PRN

## 2016-01-02 MED ORDER — LIDOCAINE HCL (PF) 1 % IJ SOLN: 5.0000 mL | INTRAMUSCULAR | Status: DC | PRN

## 2016-01-02 MED ORDER — DARBEPOETIN ALFA 100 MCG/0.5ML IJ SOSY
PREFILLED_SYRINGE | INTRAMUSCULAR | Status: AC
  Filled 2016-01-02: qty 0.5

## 2016-01-02 MED ORDER — LIDOCAINE-PRILOCAINE 2.5-2.5 % EX CREA: 1.0000 "application " | TOPICAL_CREAM | CUTANEOUS | Status: DC | PRN

## 2016-01-02 MED ORDER — PENTAFLUOROPROP-TETRAFLUOROETH EX AERO: 1.0000 "application " | INHALATION_SPRAY | CUTANEOUS | Status: DC | PRN

## 2016-01-02 NOTE — Progress Notes (Signed)
OT Cancellation    01/02/16 0800  OT Visit Information  Last OT Received On 01/02/16  Reason Eval/Treat Not Completed Patient at procedure or test/ unavailable. Pt at HD.  [facility], OTR/L  [phone] 01/02/2016

## 2016-01-02 NOTE — Progress Notes (Signed)
[facility] PROGRESS NOTE  Trevor [name] [id number] DOB: 04/21/1946 DOA: 12/26/2015 PCP: [name], MD  Length of stay: 7   Assessment/Plan: Active Problems:   Nonischemic cardiomyopathy ([place])   History of stroke   Chronic combined systolic and diastolic CHF (congestive heart failure) ([place])   Coronary artery disease, non-occlusive: Cath 2013 - Moderate D1 & RCA disease   ESRD on hemodialysis ([place])   Protein-calorie malnutrition, severe (H[facility])   Generalized weakness   Weakness generalized   Cough   ESRD (end stage renal disease) ([place])   Chest pain at rest     Brief summary 70 y.o. ? ESRD dialyzing MWF at [facility] with Dr. 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 Fever and CAP He missed dialysis day of admission and was weaker and was brought to the hospital Noted that the patient is been noncompliant with most of his medications and has not taken any of his antihypertensives prior to admission    Assessment and plan  Chest pain Rapid response called earlier this admission Serial chest x-ray shows cardiomegaly but no active disease, EKG unchanged, troponins negative Currently chest pain-free and would request PT to evaluate  Generalized weakness, likely related to orthostatic hypotension, systolic blood pressure dropped from 153>134 at 4 AM this morning   has some symptoms of a URI, cough, low-grade fever, chest x-ray negative, no signs of infection Blood culture no growth so far times 2, flu PCR negative Lactic acid, ammonia, TSH free T4 within normal limits -It is possible that resuming all his home medicines yesterday after a long gap could be potentially contributing, - no focal neurological symptoms, no meningeal signs  PT/OT evaluation  recommended skilled nursing care and placement MRI MRA of the brain to rule out CVA, vertebrobasilar insufficiency, cervical spinal stenosis? 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 (cont ASA, plavix ) Continue midodrine for orthostatic Hypotension, DC'd  flomax, proscar >18% incidence of orthostasis , discontinued a.m. clonidine, and stop clonidine during the day PT eval for vestibular rehabilitation  Nonischemic cardiomyopathy, combined chronic systolic diastolic On  0000000 EF was 55-60%, repeat 2-D echo on 1/27 shows EF of 45-50% -History of non-ST elevation MI 08/17/15 that showed an EF of 19%, He was advised to continue with aspirin Coreg and Plavix, statin, Currently Coreg is on hold,    Hypertension-currently on Coreg, hydralazine, clonidine, nephrology to titrate these further  ESRD on HD Monday Wednesday Friday Nephrology following   History of stroke - exam is nonfocal, CT unremarkable,MRI old CVA  Continue ASA and  Plavix    Chronic combined systolic and diastolic CHF (congestive heart failure)  -Volume management HD, continue Coreg    Protein-calorie malnutrition, severe ([place]) -RD consult   DVT prophylaxsis heparin  Code Status:      Code Status Orders        Start     Ordered   12/26/15 2046  Full code   Continuous     12/26/15 2045      Family Communication: Discussed in detail with the patient, all imaging results, lab results explained to the patient  Called daughter 01/03/16, no answer  Disposition Plan:  anticipate discharge to [name] in 24-36 hr      Consultants:  Nephrology  Procedures:  None  Antibiotics: Anti-infectives    None         HPI/Subjective:  Chest pain resolved, No other specific complaints Not eaten today states when he sits up he feels dizzy  Objective: Filed Vitals:   01/02/16 1130 01/02/16 1200 01/02/16 1212 01/02/16 1213  BP: 127/60 104/44 108/43 163/71  Pulse: 58 51 50 62  Temp:    96.6  F (35.9 C)  TempSrc:    Oral  Resp:    21  Height:      Weight:    61.1 kg (134 lb 11.2 oz)  SpO2:    100%    Intake/Output Summary (Last 24 hours) at 01/02/16 1301 Last data filed at 01/02/16 1213  Gross per 24 hour  Intake    240 ml  Output   1456 ml  Net  -1216 ml    Exam:  General: No acute respiratory distress, cataracts Lungs: Clear to auscultation bilaterally without wheezes or crackles Cardiovascular: Regular rate and rhythm without murmur gallop or rub normal S1 and S2 Abdomen: Nontender, nondistended, soft, bowel sounds positive, no rebound, no ascites, no appreciable mass     Data Review   Micro Results Recent Results (from the past 240 hour(s))  Urine culture     Status: None   Collection Time: 12/26/15  5:38 PM  Result Value Ref Range Status   Specimen Description URINE, CLEAN CATCH  Final   Special Requests NONE  Final   Culture MULTIPLE SPECIES PRESENT, SUGGEST RECOLLECTION  Final   Report Status 12/28/2015 FINAL  Final  Culture, blood (Routine X 2) w Reflex to ID Panel     Status: None   Collection Time: 12/27/15  1:30 PM  Result Value Ref Range Status   Specimen Description HEMODIALYSIS LINE  Final   Special Requests BOTTLES DRAWN AEROBIC AND ANAEROBIC 10MLS  Final   Culture NO GROWTH 5 DAYS  Final   Report Status 01/01/2016 FINAL  Final  Culture, blood (Routine X 2) w Reflex to ID Panel     Status: None   Collection Time: 12/27/15  1:40 PM  Result Value Ref Range Status   Specimen Description HEMODIALYSIS LINE  Final   Special Requests BOTTLES DRAWN AEROBIC AND ANAEROBIC 10MLS  Final   Culture NO GROWTH 5 DAYS  Final   Report Status 01/01/2016 FINAL  Final    Radiology Reports Dg Chest 2 View  12/26/2015  CLINICAL DATA:  Generalized weakness EXAM: CHEST  2 VIEW COMPARISON:  08/12/2015 FINDINGS: Cardiomegaly. Normal vascularity. New oval density over the right upper lung zone may reflect an overlying object. Minimal basilar hypoaeration  change. IMPRESSION: Right upper lobe nodular density may reflect an overlying object. Repeat after removal of overlying objects is recommended. Cardiomegaly without edema. Electronically Signed   By: Marybelle Killings M.D.   On: 12/26/2015 14:04   Ct Head Wo Contrast  12/26/2015  CLINICAL DATA:  Generalized weakness EXAM: CT HEAD WITHOUT CONTRAST TECHNIQUE: Contiguous axial images were obtained from the base of the skull through the vertex without intravenous contrast. COMPARISON:  04/04/2015.  04/03/2015. FINDINGS: Encephalomalacia in the left frontal and right occipital lobes is stable. Chronic ischemic changes in the periventricular white matter. No mass effect, midline shift, or acute hemorrhage. Mastoid air cells are clear. Visualized paranasal sinuses are clear. IMPRESSION: Chronic ischemic changes.  No acute intracranial pathology. Electronically Signed   By: Marybelle Killings M.D.   On: 12/26/2015 13:28   Mr Jodene Nam  Head Wo Contrast  12/29/2015  CLINICAL DATA:  Development of dizziness in weakness beginning 12/26/2015. EXAM: MRI HEAD WITHOUT CONTRAST MRA HEAD WITHOUT CONTRAST MRA NECK WITHOUT CONTRAST TECHNIQUE: Multiplanar, multiecho pulse sequences of the brain and surrounding structures were obtained without intravenous contrast. Angiographic images of the Circle of Willis were obtained using MRA technique without intravenous contrast. Angiographic images of the neck were obtained using MRA technique without intravenous contrast. Carotid stenosis measurements (when applicable) are obtained utilizing NASCET criteria, using the distal internal carotid diameter as the denominator. COMPARISON:  Head CT 12/26/2015 FINDINGS: MRI HEAD FINDINGS Diffusion imaging does not show any acute or subacute infarction. There chronic small-vessel ischemic changes throughout the pons. No focal cerebellar insult. Cerebral hemispheres show old infarction in the right PCA territory affecting the posterior medial temporal lobe and  parietal lobe. There is an old left frontal cortical and subcortical infarction. Chronic small-vessel ischemic changes are present elsewhere throughout the cerebral hemispheric white matter. No mass lesion, recent hemorrhage, hydrocephalus or extra-axial collection. No pituitary mass. No inflammatory sinus disease. No skull or skullbase lesion. MRA HEAD FINDINGS Both internal carotid arteries are patent into the brain, but there is atherosclerotic narrowing and irregularity in both carotid siphon regions. The right internal carotid artery shows supraclinoid stenosis. This vessel supplies the right middle cerebral artery territory and a diseased A1 segment on the right. The left internal carotid artery supplies the left middle cerebral artery territory gives the majority of the supply to both anterior cerebral artery territories. There is some atherosclerotic narrowing and irregularity in the more distal branch vessels bilaterally. Both vertebral arteries are patent with the right being dominant. The left vertebral artery shows atherosclerotic narrowing and irregularity at the foramen magnum and in the distal vertebral artery. There are serial stenoses estimated at 50%. The basilar artery shows atherosclerotic narrowing and irregularity with maximal stenosis measuring 30%. Superior cerebellar and posterior cerebral vessels are patent, with atherosclerotic irregularity of the more distal PCA branches. MRA NECK FINDINGS Both common carotid arteries are widely patent to the bifurcation regions. Both carotid bifurcations are patent with mild atherosclerotic change. Narrowing in the right ICA bulb is estimated at 25%. There is no stenosis on the left. Both vertebral arteries are patent through the cervical region without stenosis. Proximal vessels/intra thoracic segments are not evaluated given the noncontrast technique. IMPRESSION: MRI head: No acute insult. 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  Extensive chronic small vessel disease. MRA neck: No significant carotid bifurcation disease. Antegrade flow in both vertebral arteries. MRA head: No major vessel occlusion at this time. Extensive atherosclerotic narrowing and irregularity in both carotid siphon regions. Intracranial atherosclerotic disease diffusely affecting the more distal branch vessels as well as the A1 segment on the right. There is considerable atherosclerotic disease of the distal left vertebral artery with serial 50% stenoses. Electronically Signed   By: Nelson Chimes M.D.   On: 12/29/2015 13:40   Mr Angiogram Neck Wo Contrast  12/29/2015  CLINICAL DATA:  Development of dizziness in weakness beginning 12/26/2015. EXAM: MRI HEAD WITHOUT CONTRAST MRA HEAD WITHOUT CONTRAST MRA NECK WITHOUT CONTRAST TECHNIQUE: Multiplanar, multiecho pulse sequences of the brain and surrounding structures were obtained without intravenous contrast. Angiographic images of the Circle of Willis were obtained using MRA technique without intravenous contrast. Angiographic images of the neck were obtained using MRA technique without intravenous contrast. Carotid stenosis measurements (when applicable) are obtained utilizing NASCET criteria, using the distal internal carotid diameter as the denominator. COMPARISON:  Head CT 12/26/2015 FINDINGS: MRI HEAD FINDINGS Diffusion imaging does not show any acute or subacute infarction. There chronic small-vessel ischemic changes throughout the pons. No focal cerebellar insult. Cerebral hemispheres show old infarction in the right PCA territory affecting the posterior medial temporal lobe and parietal lobe. There is an old left frontal cortical and subcortical infarction. Chronic small-vessel ischemic changes are present elsewhere throughout the cerebral hemispheric white matter. No mass lesion, recent hemorrhage, hydrocephalus or extra-axial collection. No pituitary mass. No inflammatory sinus disease. No skull or skullbase  lesion. MRA HEAD FINDINGS Both internal carotid arteries are patent into the brain, but there is atherosclerotic narrowing and irregularity in both carotid siphon regions. The right internal carotid artery shows supraclinoid stenosis. This vessel supplies the right middle cerebral artery territory and a diseased A1 segment on the right. The left internal carotid artery supplies the left middle cerebral artery territory gives the majority of the supply to both anterior cerebral artery territories. There is some atherosclerotic narrowing and irregularity in the more distal branch vessels bilaterally. Both vertebral arteries are patent with the right being dominant. The left vertebral artery shows atherosclerotic narrowing and irregularity at the foramen magnum and in the distal vertebral artery. There are serial stenoses estimated at 50%. The basilar artery shows atherosclerotic narrowing and irregularity with maximal stenosis measuring 30%. Superior cerebellar and posterior cerebral vessels are patent, with atherosclerotic irregularity of the more distal PCA branches. MRA NECK FINDINGS Both common carotid arteries are widely patent to the bifurcation regions. Both carotid bifurcations are patent with mild atherosclerotic change. Narrowing in the right ICA bulb is estimated at 25%. There is no stenosis on the left. Both vertebral arteries are patent through the cervical region without stenosis. Proximal vessels/intra thoracic segments are not evaluated given the noncontrast technique. IMPRESSION: MRI head: No acute insult. 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 Extensive chronic small vessel disease. MRA neck: No significant carotid bifurcation disease. Antegrade flow in both vertebral arteries. MRA head: No major vessel occlusion at this time. Extensive atherosclerotic narrowing and irregularity in both carotid siphon regions. Intracranial atherosclerotic disease diffusely affecting the more distal  branch vessels as well as the A1 segment on the right. There is considerable atherosclerotic disease of the distal left vertebral artery with serial 50% stenoses. Electronically Signed   By: Nelson Chimes M.D.   On: 12/29/2015 13:40   Mr Brain Wo Contrast  12/29/2015  CLINICAL DATA:  Development of dizziness in weakness beginning 12/26/2015. EXAM: MRI HEAD WITHOUT CONTRAST MRA HEAD WITHOUT CONTRAST MRA NECK WITHOUT CONTRAST TECHNIQUE: Multiplanar, multiecho pulse sequences of the brain and surrounding structures were obtained without intravenous contrast. Angiographic images of the Circle of Willis were obtained using MRA technique without intravenous contrast. Angiographic images of the neck were obtained using MRA technique without intravenous contrast. Carotid stenosis measurements (when applicable) are obtained utilizing NASCET criteria, using the distal internal carotid diameter as the denominator. COMPARISON:  Head CT 12/26/2015 FINDINGS: MRI HEAD FINDINGS Diffusion imaging does not show any acute or subacute infarction. There chronic small-vessel ischemic changes throughout the pons. No focal cerebellar insult. Cerebral hemispheres show old infarction in the right PCA territory affecting the posterior medial temporal lobe and parietal lobe. There is an old left frontal cortical and subcortical infarction. Chronic small-vessel ischemic changes are present elsewhere throughout the cerebral hemispheric white matter. No mass lesion, recent hemorrhage, hydrocephalus or extra-axial collection. No pituitary mass. No inflammatory sinus disease. No skull or skullbase lesion. MRA HEAD  FINDINGS Both internal carotid arteries are patent into the brain, but there is atherosclerotic narrowing and irregularity in both carotid siphon regions. The right internal carotid artery shows supraclinoid stenosis. This vessel supplies the right middle cerebral artery territory and a diseased A1 segment on the right. The left  internal carotid artery supplies the left middle cerebral artery territory gives the majority of the supply to both anterior cerebral artery territories. There is some atherosclerotic narrowing and irregularity in the more distal branch vessels bilaterally. Both vertebral arteries are patent with the right being dominant. The left vertebral artery shows atherosclerotic narrowing and irregularity at the foramen magnum and in the distal vertebral artery. There are serial stenoses estimated at 50%. The basilar artery shows atherosclerotic narrowing and irregularity with maximal stenosis measuring 30%. Superior cerebellar and posterior cerebral vessels are patent, with atherosclerotic irregularity of the more distal PCA branches. MRA NECK FINDINGS Both common carotid arteries are widely patent to the bifurcation regions. Both carotid bifurcations are patent with mild atherosclerotic change. Narrowing in the right ICA bulb is estimated at 25%. There is no stenosis on the left. Both vertebral arteries are patent through the cervical region without stenosis. Proximal vessels/intra thoracic segments are not evaluated given the noncontrast technique. IMPRESSION: MRI head: No acute insult. 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 Extensive chronic small vessel disease. MRA neck: No significant carotid bifurcation disease. Antegrade flow in both vertebral arteries. MRA head: No major vessel occlusion at this time. Extensive atherosclerotic narrowing and irregularity in both carotid siphon regions. Intracranial atherosclerotic disease diffusely affecting the more distal branch vessels as well as the A1 segment on the right. There is considerable atherosclerotic disease of the distal left vertebral artery with serial 50% stenoses. Electronically Signed   By: Nelson Chimes M.D.   On: 12/29/2015 13:40   Dg Chest Port 1 View  01/01/2016  CLINICAL DATA:  Low back pain, chest pain at rest EXAM: PORTABLE CHEST 1 VIEW  COMPARISON:  12/30/2015 FINDINGS: Cardiomegaly. Lungs are clear. No effusions. No acute bony abnormality. IMPRESSION: Cardiomegaly.  No active disease. Electronically Signed   By: Rolm Baptise M.D.   On: 01/01/2016 09:31   Dg Chest Port 1 View  12/30/2015  CLINICAL DATA:  Acute onset of severe shortness of breath and left-sided chest pain that began earlier today. Current history of hypertension, cardiomyopathy, end-stage renal disease. Prior MI and stroke. EXAM: PORTABLE CHEST 1 VIEW COMPARISON:  Portable chest x-ray 12/27/2015. Two-view chest x-ray 12/26/2015 and earlier. FINDINGS: Cardiac silhouette mildly to moderately enlarged for AP portable technique, unchanged. Thoracic aorta tortuous and atherosclerotic, unchanged. Hilar and mediastinal contours otherwise unremarkable. Minimal atelectasis at the lung bases. Lungs otherwise clear. Bronchovascular markings normal. No localized airspace consolidation. No pleural effusions. No pneumothorax. Normal pulmonary vascularity. IMPRESSION: Minimal bibasilar atelectasis. No acute cardiopulmonary disease otherwise. Stable cardiomegaly without pulmonary edema. Electronically Signed   By: Evangeline Dakin M.D.   On: 12/30/2015 15:19   Dg Chest Port 1 View  12/27/2015  CLINICAL DATA:  Productive cough for 2 days, weakness - hx of MI, stroke, htn, SOB EXAM: PORTABLE CHEST 1 VIEW COMPARISON:  12/25/2013 FINDINGS: The heart size and mediastinal contours are within normal limits. Both lungs are clear. The visualized skeletal structures are unremarkable. IMPRESSION: No active disease. Electronically Signed   By: Nolon Nations M.D.   On: 12/27/2015 12:21     CBC  Recent Labs Lab 12/27/15 0505 12/28/15 0350 12/31/15 0740 01/02/16 0820  WBC 5.2  4.0 4.0 4.0  HGB 10.8* 9.8* 9.7* 9.5*  HCT 32.7* 30.1* 28.1* 27.9*  PLT 159 131* 123* 136*  MCV 90.1 90.4 90.4 90.3  MCH 29.8 29.4 31.2 30.7  MCHC 33.0 32.6 34.5 34.1  RDW 17.2* 16.9* 16.6* 16.0*    Chemistries    Recent Labs Lab 12/27/15 0505 12/28/15 0350 12/31/15 0740 01/02/16 0819  NA 140 140 139 138  K 4.9 4.1 4.8 5.0  CL 100* 101 103 102  CO2 25 29 24 26   GLUCOSE 81 79 84 82  BUN 47* 21* 38* 34*  CREATININE 10.66* 6.23* 9.36* 8.61*  CALCIUM 8.8* 8.7* 9.3 9.0  AST 9* 11*  --   --   ALT 8* 6*  --   --   ALKPHOS 74 66  --   --   BILITOT 0.7 0.5  --   --    ------------------------------------------------------------------------------------------------------------------ estimated creatinine clearance is 6.9 mL/min (by C-G formula based on Cr of 8.61). ------------------------------------------------------------------------------------------------------------------ No results for input(s): HGBA1C in the last 72 hours. ------------------------------------------------------------------------------------------------------------------ No results for input(s): CHOL, HDL, LDLCALC, TRIG, CHOLHDL, LDLDIRECT in the last 72 hours. ------------------------------------------------------------------------------------------------------------------ No results for input(s): TSH, T4TOTAL, T3FREE, THYROIDAB in the last 72 hours.  Invalid input(s): FREET3 ------------------------------------------------------------------------------------------------------------------ No results for input(s): VITAMINB12, FOLATE, FERRITIN, TIBC, IRON, RETICCTPCT in the last 72 hours.  Coagulation profile No results for input(s): INR, PROTIME in the last 168 hours.  No results for input(s): DDIMER in the last 72 hours.  Cardiac Enzymes  Recent Labs Lab 01/01/16 1112 01/01/16 1428 01/01/16 2100  TROPONINI 0.04* 0.03 0.03   ------------------------------------------------------------------------------------------------------------------ Invalid input(s): POCBNP   CBG: No results for input(s): GLUCAP in the last 168 hours.     Studies: Dg Chest Port 1 View  01/01/2016  CLINICAL DATA:  Low back pain,  chest pain at rest EXAM: PORTABLE CHEST 1 VIEW COMPARISON:  12/30/2015 FINDINGS: Cardiomegaly. Lungs are clear. No effusions. No acute bony abnormality. IMPRESSION: Cardiomegaly.  No active disease. Electronically Signed   By: Rolm Baptise M.D.   On: 01/01/2016 09:31      Lab Results  Component Value Date   HGBA1C 4.9 04/04/2015   HGBA1C 5.2 07/07/2014   HGBA1C 5.4 10/08/2011   Lab Results  Component Value Date   LDLCALC 35 08/17/2015   CREATININE 8.61* 01/02/2016       Scheduled Meds: . aspirin  81 mg Oral Daily  . atorvastatin  40 mg Oral q1800  . carvedilol  12.5 mg Oral BID WC  . cloNIDine  0.1 mg Oral QHS  . clopidogrel  75 mg Oral Daily  . darbepoetin (ARANESP) injection - DIALYSIS  100 mcg Intravenous Q Wed-HD  . feeding supplement (NEPRO CARB STEADY)  237 mL Oral BID BM  . gabapentin  100 mg Oral QHS  . heparin  5,000 Units Subcutaneous 3 times per day  . hydrALAZINE  50 mg Oral BID  . midodrine  5 mg Oral TID WC  . multivitamin  1 tablet Oral QHS  . pantoprazole  40 mg Oral Daily  . polyethylene glycol  17 g Oral BID  . sevelamer carbonate  1,600 mg Oral TID WC   Continuous Infusions:   Active Problems:   Nonischemic cardiomyopathy ([facility])   History of stroke   Chronic combined systolic and diastolic CHF (congestive heart failure) ([place])   Coronary artery disease, non-occlusive: Cath 2013 - Moderate D1 & RCA disease   ESRD on hemodialysis ([facility])   Protein-calorie malnutrition, severe ([place])  Generalized weakness   Weakness generalized   Cough   ESRD (end stage renal disease) ([place])   Chest pain at rest   Verneita Griffes, MD [facility] [phone]

## 2016-01-02 NOTE — Progress Notes (Signed)
Occupational Therapy Treatment Patient Details Name: Trevor [name] MRN: [id number] DOB: 12-10-1945 Today's Date: 01/02/2016    History of present illness 70 y.o. male admitted for generalized weakness beginning after dialysis on 12/24/15 and also had subjective fevers and a productive cough. 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   OT comments  Further assessed pt's ability to complete self care and D/C home. Do not feel pt is safe to D/D home at this level. Pt will benefit from rehab at [name] to facilitate safe D/C home. Pt complaining of dizziness with movement. BP 114/77 sitting and 126/68 standing. Will continue to follow acutely to address established goals.   Follow Up Recommendations  [name];Supervision/Assistance - 24 hour    Equipment Recommendations  None recommended by OT    Recommendations for Other Services      Precautions / Restrictions Precautions Precautions: Fall Precaution Comments: Fall onto Rt hand.  Patient also reports it is taking him longer to "recover" after HD.  Patient blind in Rt eye and sees only shapes in Lt eye.       Mobility Bed Mobility Overal bed mobility: Modified Independent                Transfers Overall transfer level: Needs assistance Equipment used: Rolling walker (2 wheeled) Transfers: Sit to/from [facility] Sit to Stand: Supervision Stand pivot transfers: Min guard            Balance             Standing balance-Leahy Scale: Poor                     ADL                       Lower Body Dressing: Min guard;Sit to/from stand                 General ADL Comments: Pt complains of feeling dizzy when sitting and when stadning. Unable to stand still longer than 1 minute      Vision  low vision. Blind L eye. Sees shapes R eye                   Perception     Praxis      Cognition   Behavior During Therapy: WFL for tasks assessed/performed Overall Cognitive Status: Within Functional Limits for tasks assessed                       Extremity/Trunk Assessment     generalized weakness          Exercises  encouraged BUE AROM. Will benefit from theraband HEP   Shoulder Instructions       General Comments      Pertinent Vitals/ Pain       Pain Assessment: Faces Faces Pain Scale: Hurts a little bit Pain Location: back Pain Descriptors / Indicators: Grimacing Pain Intervention(s): Limited activity within patient's tolerance  Home Living                                          Prior Functioning/Environment              Frequency Min 2X/week  Progress Toward Goals  OT Goals(current goals can now be found in the care plan section)  Progress towards OT goals: Progressing toward goals  Acute Rehab OT Goals Patient Stated Goal: to get stronger  OT Goal Formulation: With patient Time For Goal Achievement: 01/11/16 Potential to Achieve Goals: Good ADL Goals Pt Will Transfer to Toilet: with modified independence;ambulating;regular height toilet Pt Will Perform Toileting - Clothing Manipulation and hygiene: with modified independence;sit to/from stand;sitting/lateral leans Pt Will Perform Tub/Shower Transfer: Shower transfer;with modified independence;ambulating;3 in 1;rolling walker Additional ADL Goal #1: Pt will verbalize 3 low vision compensatory strategies to increase safety at home.  Added HEP strengthening goal  Plan Discharge plan needs to be updated    Co-evaluation                 End of Session Equipment Utilized During Treatment: Rolling walker;Gait belt   Activity Tolerance Patient tolerated treatment well   Patient Left in bed;with call bell/phone within reach;with bed alarm set   Nurse Communication Mobility status        Time: 1545-1601 OT Time Calculation  (min): 16 min  Charges: OT General Charges $OT Visit: 1 Procedure OT Treatments $Self Care/Home Management : 8-22 mins  Aaren Krog,HILLARY 01/02/2016, 4:05 PM   [facility], OTR/L  [phone] 01/02/2016

## 2016-01-02 NOTE — Procedures (Signed)
Patient was seen on dialysis and the procedure was supervised.  BFR 400  Via AVF BP is  130/64.   Patient appears to be tolerating treatment well  Giliana Vantil A 01/02/2016

## 2016-01-02 NOTE — Progress Notes (Signed)
[facility] Progress Note  Assessment/Plan: 1 Orthostatic lightheadness - head CT neg, ECHO normal except for EF 40-45%. Gave 3L NS and didn't improve his symptoms at all. EKG/ trop's neg on admission. MRI/ MRA brain unremarkable. Patient had been without BP medications for several weeks due to not having anyone to fill his medication planner-was extremely hypertensive when last seen in HD center 12/24/15- so could be just getting used to taking meds again ?  Or just some deconditioning as well. Midodrine has been added- I also have stopped his AM clonidine and just give it at night.  Will go to [name] upon DC. Believe this is best option for his care. PT seeing pt while inpatient.  2. ESRD - MWF at [facility]. For HD today  3. Anemia - HGB 9.5. Last ESA dose 12/19/15. Give Aranesp today. Follow HBG.  4. Secondary hyperparathyroidism - Ca 9.3 C Ca 10.5 Change binders to renvela.  5. HTN/volume - BP highish when lying but does g down wth sitting or standing. Post wt 62.2 kg-at OP EDW. BP stable during HD. Wt today not done-needs daily wts. Will attempt UFG 1-1.5 . SB on monitor, Stopping clonidine during the day may help- his HR usually goes up when he is standing so dont think this is only problem.  We may need to tolerate a higher BP sitting understanding that it goes down when he stands 6. Nutrition - Albumin 2.5. Renal diet. Add nepro/renal vits  Disposition: [facility] and Rehab once medically stable- not sure what the endpoint is- is Ok for discharge by our standpoint  Love Chowning A    Subjective:  Seen on HD- he feels he is better- does not seem that he has been able to participate in therapy that much  Objective Filed Vitals:   01/02/16 0804 01/02/16 0809 01/02/16 0830 01/02/16 0900  BP: 164/70 164/78 156/78 143/65  Pulse: 55 55 63 55  Temp: 97.3 F (36.3 C)     TempSrc: Oral     Resp: 18     Height:      Weight: 62.8 kg (138 lb 7.2 oz)     SpO2: 100%       Physical Exam General: Thin male, cooperative, NAD.  Heart:  S1, S2, soft II/VI M. SB on monitor Lungs: Bilateral breath sounds CTA A/P Abdomen: soft nontender with active BS Extremities: No LE edema.  Dialysis Access:  LUA AVF + thrill + Bruit  Dialysis Orders: [facility] MonWedFri, 4 hrs 0 min, 180NRe Optiflux, BFR 400, DFR Autoflow 1.5, EDW 62 (kg), Dialysate 2.0 K, 2.25 Ca, UFR Profile: Profile 4, Sodium Model: None, Access: LUA AV Fistula Heparin: 1900 Units per treatment Mircera: 50 mcg IV q 2 weeks (last dose 12/19/15)  Additional Objective Labs: Basic Metabolic Panel:  Recent Labs Lab 12/28/15 0350 12/31/15 0740 01/02/16 0819  NA 140 139 138  K 4.1 4.8 5.0  CL 101 103 102  CO2 29 24 26   GLUCOSE 79 84 82  BUN 21* 38* 34*  CREATININE 6.23* 9.36* 8.61*  CALCIUM 8.7* 9.3 9.0  PHOS  --   --  5.9*   Liver Function Tests:  Recent Labs Lab 12/27/15 0505 12/28/15 0350 01/02/16 0819  AST 9* 11*  --   ALT 8* 6*  --   ALKPHOS 74 66  --   BILITOT 0.7 0.5  --   PROT 6.0* 5.6*  --   ALBUMIN 2.8* 2.5* 2.8*   No results for input(s): LIPASE, AMYLASE  in the last 168 hours. CBC:  Recent Labs Lab 12/26/15 1135 12/27/15 0505 12/28/15 0350 12/31/15 0740 01/02/16 0820  WBC 4.5 5.2 4.0 4.0 4.0  HGB 12.8* 10.8* 9.8* 9.7* 9.5*  HCT 38.7* 32.7* 30.1* 28.1* 27.9*  MCV 90.2 90.1 90.4 90.4 90.3  PLT 168 159 131* 123* 136*   Blood Culture    Component Value Date/Time   SDES HEMODIALYSIS LINE 12/27/2015 1340   SPECREQUEST BOTTLES DRAWN AEROBIC AND ANAEROBIC 10MLS 12/27/2015 1340   CULT NO GROWTH 5 DAYS 12/27/2015 1340   REPTSTATUS 01/01/2016 FINAL 12/27/2015 1340    Cardiac Enzymes:  Recent Labs Lab 01/01/16 1112 01/01/16 1428 01/01/16 2100  TROPONINI 0.04* 0.03 0.03   CBG: No results for input(s): GLUCAP in the last 168 hours. Iron Studies: No results for input(s): IRON, TIBC, TRANSFERRIN, FERRITIN in the last 72 hours. @lablastinr3 @ Studies/Results: Dg  Chest Port 1 View  01/01/2016  CLINICAL DATA:  Low back pain, chest pain at rest EXAM: PORTABLE CHEST 1 VIEW COMPARISON:  12/30/2015 FINDINGS: Cardiomegaly. Lungs are clear. No effusions. No acute bony abnormality. IMPRESSION: Cardiomegaly.  No active disease. Electronically Signed   By: Rolm Baptise M.D.   On: 01/01/2016 09:31   Medications:   . aspirin  81 mg Oral Daily  . atorvastatin  40 mg Oral q1800  . carvedilol  12.5 mg Oral BID WC  . cloNIDine  0.1 mg Oral QHS  . clopidogrel  75 mg Oral Daily  . feeding supplement (NEPRO CARB STEADY)  237 mL Oral BID BM  . gabapentin  100 mg Oral QHS  . heparin  5,000 Units Subcutaneous 3 times per day  . hydrALAZINE  50 mg Oral BID  . midodrine  5 mg Oral TID WC  . multivitamin  1 tablet Oral QHS  . pantoprazole  40 mg Oral Daily  . polyethylene glycol  17 g Oral BID  . sevelamer carbonate  1,600 mg Oral TID WC

## 2016-01-03 DIAGNOSIS — I429 Cardiomyopathy, unspecified: Secondary | ICD-10-CM | POA: Diagnosis not present

## 2016-01-03 DIAGNOSIS — Z8673 Personal history of transient ischemic attack (TIA), and cerebral infarction without residual deficits: Secondary | ICD-10-CM | POA: Diagnosis not present

## 2016-01-03 DIAGNOSIS — D631 Anemia in chronic kidney disease: Secondary | ICD-10-CM | POA: Diagnosis not present

## 2016-01-03 DIAGNOSIS — I120 Hypertensive chronic kidney disease with stage 5 chronic kidney disease or end stage renal disease: Secondary | ICD-10-CM | POA: Diagnosis not present

## 2016-01-03 DIAGNOSIS — I110 Hypertensive heart disease with heart failure: Secondary | ICD-10-CM | POA: Diagnosis not present

## 2016-01-03 DIAGNOSIS — I5042 Chronic combined systolic (congestive) and diastolic (congestive) heart failure: Secondary | ICD-10-CM | POA: Diagnosis not present

## 2016-01-03 DIAGNOSIS — I2510 Atherosclerotic heart disease of native coronary artery without angina pectoris: Secondary | ICD-10-CM | POA: Diagnosis not present

## 2016-01-03 DIAGNOSIS — R531 Weakness: Secondary | ICD-10-CM | POA: Diagnosis not present

## 2016-01-03 DIAGNOSIS — E43 Unspecified severe protein-calorie malnutrition: Secondary | ICD-10-CM | POA: Diagnosis not present

## 2016-01-03 DIAGNOSIS — E785 Hyperlipidemia, unspecified: Secondary | ICD-10-CM | POA: Diagnosis not present

## 2016-01-03 DIAGNOSIS — T50901A Poisoning by unspecified drugs, medicaments and biological substances, accidental (unintentional), initial encounter: Secondary | ICD-10-CM | POA: Diagnosis not present

## 2016-01-03 DIAGNOSIS — N186 End stage renal disease: Secondary | ICD-10-CM

## 2016-01-03 DIAGNOSIS — I951 Orthostatic hypotension: Secondary | ICD-10-CM | POA: Diagnosis not present

## 2016-01-03 DIAGNOSIS — Z9119 Patient's noncompliance with other medical treatment and regimen: Secondary | ICD-10-CM | POA: Diagnosis not present

## 2016-01-03 DIAGNOSIS — Z992 Dependence on renal dialysis: Secondary | ICD-10-CM | POA: Diagnosis not present

## 2016-01-03 DIAGNOSIS — F1410 Cocaine abuse, uncomplicated: Secondary | ICD-10-CM | POA: Diagnosis not present

## 2016-01-03 MED ORDER — MIDODRINE HCL 5 MG PO TABS: 5.0000 mg | ORAL_TABLET | Freq: Three times a day (TID) | ORAL | Status: DC

## 2016-01-03 MED ORDER — HYDRALAZINE HCL 50 MG PO TABS: 50.0000 mg | ORAL_TABLET | Freq: Two times a day (BID) | ORAL | Status: DC

## 2016-01-03 MED ORDER — CLONIDINE HCL 0.1 MG PO TABS: 0.1000 mg | ORAL_TABLET | Freq: Every day | ORAL | Status: DC

## 2016-01-03 NOTE — Progress Notes (Signed)
Patient for d/c today to [name] bed at [facility]. Daughter, [name] and patient agreeable to this plan- plan transfer via EMS. Eduard Clos, MSW, [place]

## 2016-01-03 NOTE — Discharge Summary (Signed)
Physician Discharge Summary  Trevor [name] [id number] DOB: October 31, 1946 DOA: 12/26/2015  PCP: Trevor Potts, MD  Admit date: 12/26/2015 Discharge date: 01/03/2016  Time spent: 35 minutes  Recommendations for Outpatient Follow-up:  1. Please note changes in medication dosages-he has been given clonidine daily at bedtime his hydralazine has changed from 50 3 times a day to twice a day we have discontinued his Imdur completely and would recommend having him have preferably slightly higher blood pressures and systolic range [id number] to [id number] rather than him being symptomatic from orthostasis 2. He is to dialyze Monday Wednesday Friday and get Aranesp with that 3. He should have screening labs in about a week with CBC as well as basic metabolic panel 4. Consider outpatient urology input as patient is not on any of his medications for B pH   Discharge Diagnoses:  Active Problems:   Nonischemic cardiomyopathy (HCC)   History of stroke   Chronic combined systolic and diastolic CHF (congestive heart failure) (HCC)   Coronary artery disease, non-occlusive: Cath 2013 - Moderate D1 & RCA disease   ESRD on hemodialysis (HCC)   Protein-calorie malnutrition, severe (HCC)   Generalized weakness   Weakness generalized   Cough   ESRD (end stage renal disease) (HCC)   Chest pain at rest   Discharge Condition: Improved  Diet recommendation: Renal heart healthy  Filed Weights   01/02/16 0804 01/02/16 1213 01/03/16 0605  Weight: 62.8 kg (138 lb 7.2 oz) 61.1 kg (134 lb 11.2 oz) 60.51 kg (133 lb 6.4 oz)    History of present illness:  70 y.o. ? ESRD dialyzing MWF at [facility] with Dr. 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 Fever and CAP He missed dialysis day of admission and was weaker and was  brought to the hospital Noted that the patient is been noncompliant with most of his medications and has not taken any of his antihypertensives prior to admission     Hospital Course:   Generalized weakness, likely related to orthostatic hypotension,  systolic blood pressure dropped during admission multiple times   Workup for infectious causes was negative -It is possible that resuming all his home medicines yesterday after a long gap could be potentially contributing, - no focal neurological symptoms, no meningeal signs   PT/OT evaluation recommended skilled nursing care and placement MRI MRA of the brain to rule out CVA, vertebrobasilar insufficiency, cervical spinal stenosis? 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 (cont ASA, plavix ) Started midodrine 5 mg 3 times a day for orthostatic Hypotension, DC'd  flomax, proscar >18% incidence of orthostasis and was discontinued, discontinued a.m. clonidine, and stop clonidine during the day  PT eval for vestibular rehabilitation   Nonischemic cardiomyopathy, combined chronic systolic diastolic On  0000000 EF was 55-60%, repeat 2-D echo on 1/27 shows EF of 45-50% -History of non-ST elevation MI 08/17/15 that showed an EF of 19%, He was advised to continue with aspirin Coreg and Plavix, statin, Currently Coreg has been down titrated to 12.5 twice a day  Hypertension-currently on Coreg, hydralazine, clonidine, nephrology to titrate these further Discharge doses are Coreg 12.5 twice a day  hydralazine 50 3 times a day changed to 50 twice a day Imdur 90 discontinued Clonidine 0.1 only daily at bedtime instead of twice  a day Recommend higher blood pressures as an outpatient with systolics in the [id number] to [id number]  ESRD on HD Monday Wednesday Friday Nephrology following and input appreciated    History of stroke - exam is nonfocal, CT unremarkable,MRI old CVA  Continue ASA and  Plavix     Chronic combined systolic and diastolic  CHF (congestive heart failure)   -Volume management HD, continue Coreg      Protein-calorie malnutrition, severe ([place]) -RD consult   Discharge Exam: Filed Vitals:   01/03/16 0038 01/03/16 0605  BP: 171/62 174/66  Pulse: 57 76  Temp: 98.7 F (37.1 C) 98.8 F (37.1 C)  Resp:  20    General: Alert oriented feels better  Cardiovascular: S1-S2 no murmur rub or gallop Respiratory: Clinically clear no added sound No lower extremity edema no JVD  Discharge Instructions   Discharge Instructions    Diet - low sodium heart healthy    Complete by:  As directed      Increase activity slowly    Complete by:  As directed           Current Discharge Medication List    START taking these medications   Details  midodrine (PROAMATINE) 5 MG tablet Take 1 tablet (5 mg total) by mouth 3 (three) times daily with meals.      CONTINUE these medications which have CHANGED   Details  cloNIDine (CATAPRES) 0.1 MG tablet Take 1 tablet (0.1 mg total) by mouth at bedtime. Qty: 60 tablet, Refills: 0    hydrALAZINE (APRESOLINE) 50 MG tablet Take 1 tablet (50 mg total) by mouth 2 (two) times daily. Qty: 90 tablet, Refills: 3      CONTINUE these medications which have NOT CHANGED   Details  aspirin 81 MG chewable tablet Chew 1 tablet (81 mg total) by mouth daily.    atorvastatin (LIPITOR) 40 MG tablet Take 1 tablet (40 mg total) by mouth daily at 6 PM.    B Complex-C-Folic Acid (DIALYVITE TABLET) TABS Take 1 tablet by mouth daily. Refills: 0    calcium acetate (PHOSLO) 667 MG capsule Take 667 mg by mouth 3 (three) times daily with meals.    carvedilol (COREG) 12.5 MG tablet Take 1 tablet (12.5 mg total) by mouth 2 (two) times daily with a meal.    clopidogrel (PLAVIX) 75 MG tablet Take 1 tablet (75 mg total) by mouth daily. Qty: 30 tablet, Refills: 2    gabapentin (NEURONTIN) 100 MG capsule Take 1 capsule (100 mg total) by mouth 3 (three) times daily. Qty: 90 capsule, Refills: 2     omega-3 acid ethyl esters (LOVAZA) 1 G capsule Take 1 capsule (1 g total) by mouth 2 (two) times daily. Qty: 60 capsule, Refills: 2    pantoprazole (PROTONIX) 40 MG tablet Take 1 tablet (40 mg total) by mouth daily.    tamsulosin (FLOMAX) 0.4 MG CAPS capsule Take 0.4 mg by mouth 2 (two) times daily. Refills: 0      STOP taking these medications     finasteride (PROSCAR) 5 MG tablet      multivitamin (RENA-VIT) TABS tablet      NIFEdipine (PROCARDIA XL/ADALAT-CC) 90 MG 24 hr tablet        No Known Allergies Follow-up Information    Follow up with [name] .   Specialty:  [place] information:   [address] [phone]  The results of significant diagnostics from this hospitalization (including imaging, microbiology, ancillary and laboratory) are listed below for reference.    Significant Diagnostic Studies: Dg Chest 2 View  12/26/2015  CLINICAL DATA:  Generalized weakness EXAM: CHEST  2 VIEW COMPARISON:  08/12/2015 FINDINGS: Cardiomegaly. Normal vascularity. New oval density over the right upper lung zone may reflect an overlying object. Minimal basilar hypoaeration change. IMPRESSION: Right upper lobe nodular density may reflect an overlying object. Repeat after removal of overlying objects is recommended. Cardiomegaly without edema. Electronically Signed   By: Marybelle Killings M.D.   On: 12/26/2015 14:04   Ct Head Wo Contrast  12/26/2015  CLINICAL DATA:  Generalized weakness EXAM: CT HEAD WITHOUT CONTRAST TECHNIQUE: Contiguous axial images were obtained from the base of the skull through the vertex without intravenous contrast. COMPARISON:  04/04/2015.  04/03/2015. FINDINGS: Encephalomalacia in the left frontal and right occipital lobes is stable. Chronic ischemic changes in the periventricular white matter. No mass effect, midline shift, or acute hemorrhage. Mastoid air cells are  clear. Visualized paranasal sinuses are clear. IMPRESSION: Chronic ischemic changes.  No acute intracranial pathology. Electronically Signed   By: Marybelle Killings M.D.   On: 12/26/2015 13:28   Mr Jodene Nam Head Wo Contrast  12/29/2015  CLINICAL DATA:  Development of dizziness in weakness beginning 12/26/2015. EXAM: MRI HEAD WITHOUT CONTRAST MRA HEAD WITHOUT CONTRAST MRA NECK WITHOUT CONTRAST TECHNIQUE: Multiplanar, multiecho pulse sequences of the brain and surrounding structures were obtained without intravenous contrast. Angiographic images of the Circle of Willis were obtained using MRA technique without intravenous contrast. Angiographic images of the neck were obtained using MRA technique without intravenous contrast. Carotid stenosis measurements (when applicable) are obtained utilizing NASCET criteria, using the distal internal carotid diameter as the denominator. COMPARISON:  Head CT 12/26/2015 FINDINGS: MRI HEAD FINDINGS Diffusion imaging does not show any acute or subacute infarction. There chronic small-vessel ischemic changes throughout the pons. No focal cerebellar insult. Cerebral hemispheres show old infarction in the right PCA territory affecting the posterior medial temporal lobe and parietal lobe. There is an old left frontal cortical and subcortical infarction. Chronic small-vessel ischemic changes are present elsewhere throughout the cerebral hemispheric white matter. No mass lesion, recent hemorrhage, hydrocephalus or extra-axial collection. No pituitary mass. No inflammatory sinus disease. No skull or skullbase lesion. MRA HEAD FINDINGS Both internal carotid arteries are patent into the brain, but there is atherosclerotic narrowing and irregularity in both carotid siphon regions. The right internal carotid artery shows supraclinoid stenosis. This vessel supplies the right middle cerebral artery territory and a diseased A1 segment on the right. The left internal carotid artery supplies the left middle  cerebral artery territory gives the majority of the supply to both anterior cerebral artery territories. There is some atherosclerotic narrowing and irregularity in the more distal branch vessels bilaterally. Both vertebral arteries are patent with the right being dominant. The left vertebral artery shows atherosclerotic narrowing and irregularity at the foramen magnum and in the distal vertebral artery. There are serial stenoses estimated at 50%. The basilar artery shows atherosclerotic narrowing and irregularity with maximal stenosis measuring 30%. Superior cerebellar and posterior cerebral vessels are patent, with atherosclerotic irregularity of the more distal PCA branches. MRA NECK FINDINGS Both common carotid arteries are widely patent to the bifurcation regions. Both carotid bifurcations are patent with mild atherosclerotic change. Narrowing in the right ICA bulb is estimated at 25%. There is no stenosis on the left. Both vertebral arteries are patent through the cervical region  without stenosis. Proximal vessels/intra thoracic segments are not evaluated given the noncontrast technique. IMPRESSION: MRI head: No acute insult. 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 Extensive chronic small vessel disease. MRA neck: No significant carotid bifurcation disease. Antegrade flow in both vertebral arteries. MRA head: No major vessel occlusion at this time. Extensive atherosclerotic narrowing and irregularity in both carotid siphon regions. Intracranial atherosclerotic disease diffusely affecting the more distal branch vessels as well as the A1 segment on the right. There is considerable atherosclerotic disease of the distal left vertebral artery with serial 50% stenoses. Electronically Signed   By: Nelson Chimes M.D.   On: 12/29/2015 13:40   Mr Angiogram Neck Wo Contrast  12/29/2015  CLINICAL DATA:  Development of dizziness in weakness beginning 12/26/2015. EXAM: MRI HEAD WITHOUT CONTRAST MRA HEAD  WITHOUT CONTRAST MRA NECK WITHOUT CONTRAST TECHNIQUE: Multiplanar, multiecho pulse sequences of the brain and surrounding structures were obtained without intravenous contrast. Angiographic images of the Circle of Willis were obtained using MRA technique without intravenous contrast. Angiographic images of the neck were obtained using MRA technique without intravenous contrast. Carotid stenosis measurements (when applicable) are obtained utilizing NASCET criteria, using the distal internal carotid diameter as the denominator. COMPARISON:  Head CT 12/26/2015 FINDINGS: MRI HEAD FINDINGS Diffusion imaging does not show any acute or subacute infarction. There chronic small-vessel ischemic changes throughout the pons. No focal cerebellar insult. Cerebral hemispheres show old infarction in the right PCA territory affecting the posterior medial temporal lobe and parietal lobe. There is an old left frontal cortical and subcortical infarction. Chronic small-vessel ischemic changes are present elsewhere throughout the cerebral hemispheric white matter. No mass lesion, recent hemorrhage, hydrocephalus or extra-axial collection. No pituitary mass. No inflammatory sinus disease. No skull or skullbase lesion. MRA HEAD FINDINGS Both internal carotid arteries are patent into the brain, but there is atherosclerotic narrowing and irregularity in both carotid siphon regions. The right internal carotid artery shows supraclinoid stenosis. This vessel supplies the right middle cerebral artery territory and a diseased A1 segment on the right. The left internal carotid artery supplies the left middle cerebral artery territory gives the majority of the supply to both anterior cerebral artery territories. There is some atherosclerotic narrowing and irregularity in the more distal branch vessels bilaterally. Both vertebral arteries are patent with the right being dominant. The left vertebral artery shows atherosclerotic narrowing and  irregularity at the foramen magnum and in the distal vertebral artery. There are serial stenoses estimated at 50%. The basilar artery shows atherosclerotic narrowing and irregularity with maximal stenosis measuring 30%. Superior cerebellar and posterior cerebral vessels are patent, with atherosclerotic irregularity of the more distal PCA branches. MRA NECK FINDINGS Both common carotid arteries are widely patent to the bifurcation regions. Both carotid bifurcations are patent with mild atherosclerotic change. Narrowing in the right ICA bulb is estimated at 25%. There is no stenosis on the left. Both vertebral arteries are patent through the cervical region without stenosis. Proximal vessels/intra thoracic segments are not evaluated given the noncontrast technique. IMPRESSION: MRI head: No acute insult. 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 Extensive chronic small vessel disease. MRA neck: No significant carotid bifurcation disease. Antegrade flow in both vertebral arteries. MRA head: No major vessel occlusion at this time. Extensive atherosclerotic narrowing and irregularity in both carotid siphon regions. Intracranial atherosclerotic disease diffusely affecting the more distal branch vessels as well as the A1 segment on the right. There is considerable atherosclerotic disease of the distal left  vertebral artery with serial 50% stenoses. Electronically Signed   By: Nelson Chimes M.D.   On: 12/29/2015 13:40   Mr Brain Wo Contrast  12/29/2015  CLINICAL DATA:  Development of dizziness in weakness beginning 12/26/2015. EXAM: MRI HEAD WITHOUT CONTRAST MRA HEAD WITHOUT CONTRAST MRA NECK WITHOUT CONTRAST TECHNIQUE: Multiplanar, multiecho pulse sequences of the brain and surrounding structures were obtained without intravenous contrast. Angiographic images of the Circle of Willis were obtained using MRA technique without intravenous contrast. Angiographic images of the neck were obtained using MRA  technique without intravenous contrast. Carotid stenosis measurements (when applicable) are obtained utilizing NASCET criteria, using the distal internal carotid diameter as the denominator. COMPARISON:  Head CT 12/26/2015 FINDINGS: MRI HEAD FINDINGS Diffusion imaging does not show any acute or subacute infarction. There chronic small-vessel ischemic changes throughout the pons. No focal cerebellar insult. Cerebral hemispheres show old infarction in the right PCA territory affecting the posterior medial temporal lobe and parietal lobe. There is an old left frontal cortical and subcortical infarction. Chronic small-vessel ischemic changes are present elsewhere throughout the cerebral hemispheric white matter. No mass lesion, recent hemorrhage, hydrocephalus or extra-axial collection. No pituitary mass. No inflammatory sinus disease. No skull or skullbase lesion. MRA HEAD FINDINGS Both internal carotid arteries are patent into the brain, but there is atherosclerotic narrowing and irregularity in both carotid siphon regions. The right internal carotid artery shows supraclinoid stenosis. This vessel supplies the right middle cerebral artery territory and a diseased A1 segment on the right. The left internal carotid artery supplies the left middle cerebral artery territory gives the majority of the supply to both anterior cerebral artery territories. There is some atherosclerotic narrowing and irregularity in the more distal branch vessels bilaterally. Both vertebral arteries are patent with the right being dominant. The left vertebral artery shows atherosclerotic narrowing and irregularity at the foramen magnum and in the distal vertebral artery. There are serial stenoses estimated at 50%. The basilar artery shows atherosclerotic narrowing and irregularity with maximal stenosis measuring 30%. Superior cerebellar and posterior cerebral vessels are patent, with atherosclerotic irregularity of the more distal PCA branches.  MRA NECK FINDINGS Both common carotid arteries are widely patent to the bifurcation regions. Both carotid bifurcations are patent with mild atherosclerotic change. Narrowing in the right ICA bulb is estimated at 25%. There is no stenosis on the left. Both vertebral arteries are patent through the cervical region without stenosis. Proximal vessels/intra thoracic segments are not evaluated given the noncontrast technique. IMPRESSION: MRI head: No acute insult. 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 Extensive chronic small vessel disease. MRA neck: No significant carotid bifurcation disease. Antegrade flow in both vertebral arteries. MRA head: No major vessel occlusion at this time. Extensive atherosclerotic narrowing and irregularity in both carotid siphon regions. Intracranial atherosclerotic disease diffusely affecting the more distal branch vessels as well as the A1 segment on the right. There is considerable atherosclerotic disease of the distal left vertebral artery with serial 50% stenoses. Electronically Signed   By: Nelson Chimes M.D.   On: 12/29/2015 13:40   Dg Chest Port 1 View  01/01/2016  CLINICAL DATA:  Low back pain, chest pain at rest EXAM: PORTABLE CHEST 1 VIEW COMPARISON:  12/30/2015 FINDINGS: Cardiomegaly. Lungs are clear. No effusions. No acute bony abnormality. IMPRESSION: Cardiomegaly.  No active disease. Electronically Signed   By: Rolm Baptise M.D.   On: 01/01/2016 09:31   Dg Chest Port 1 View  12/30/2015  CLINICAL DATA:  Acute onset  of severe shortness of breath and left-sided chest pain that began earlier today. Current history of hypertension, cardiomyopathy, end-stage renal disease. Prior MI and stroke. EXAM: PORTABLE CHEST 1 VIEW COMPARISON:  Portable chest x-ray 12/27/2015. Two-view chest x-ray 12/26/2015 and earlier. FINDINGS: Cardiac silhouette mildly to moderately enlarged for AP portable technique, unchanged. Thoracic aorta tortuous and atherosclerotic,  unchanged. Hilar and mediastinal contours otherwise unremarkable. Minimal atelectasis at the lung bases. Lungs otherwise clear. Bronchovascular markings normal. No localized airspace consolidation. No pleural effusions. No pneumothorax. Normal pulmonary vascularity. IMPRESSION: Minimal bibasilar atelectasis. No acute cardiopulmonary disease otherwise. Stable cardiomegaly without pulmonary edema. Electronically Signed   By: Evangeline Dakin M.D.   On: 12/30/2015 15:19   Dg Chest Port 1 View  12/27/2015  CLINICAL DATA:  Productive cough for 2 days, weakness - hx of MI, stroke, htn, SOB EXAM: PORTABLE CHEST 1 VIEW COMPARISON:  12/25/2013 FINDINGS: The heart size and mediastinal contours are within normal limits. Both lungs are clear. The visualized skeletal structures are unremarkable. IMPRESSION: No active disease. Electronically Signed   By: Nolon Nations M.D.   On: 12/27/2015 12:21    Microbiology: Recent Results (from the past 240 hour(s))  Urine culture     Status: None   Collection Time: 12/26/15  5:38 PM  Result Value Ref Range Status   Specimen Description URINE, CLEAN CATCH  Final   Special Requests NONE  Final   Culture MULTIPLE SPECIES PRESENT, SUGGEST RECOLLECTION  Final   Report Status 12/28/2015 FINAL  Final  Culture, blood (Routine X 2) w Reflex to ID Panel     Status: None   Collection Time: 12/27/15  1:30 PM  Result Value Ref Range Status   Specimen Description HEMODIALYSIS LINE  Final   Special Requests BOTTLES DRAWN AEROBIC AND ANAEROBIC 10MLS  Final   Culture NO GROWTH 5 DAYS  Final   Report Status 01/01/2016 FINAL  Final  Culture, blood (Routine X 2) w Reflex to ID Panel     Status: None   Collection Time: 12/27/15  1:40 PM  Result Value Ref Range Status   Specimen Description HEMODIALYSIS LINE  Final   Special Requests BOTTLES DRAWN AEROBIC AND ANAEROBIC 10MLS  Final   Culture NO GROWTH 5 DAYS  Final   Report Status 01/01/2016 FINAL  Final     Labs: Basic  Metabolic Panel:  Recent Labs Lab 12/28/15 0350 12/31/15 0740 01/02/16 0819  NA 140 139 138  K 4.1 4.8 5.0  CL 101 103 102  CO2 29 24 26   GLUCOSE 79 84 82  BUN 21* 38* 34*  CREATININE 6.23* 9.36* 8.61*  CALCIUM 8.7* 9.3 9.0  PHOS  --   --  5.9*   Liver Function Tests:  Recent Labs Lab 12/28/15 0350 01/02/16 0819  AST 11*  --   ALT 6*  --   ALKPHOS 66  --   BILITOT 0.5  --   PROT 5.6*  --   ALBUMIN 2.5* 2.8*   No results for input(s): LIPASE, AMYLASE in the last 168 hours. No results for input(s): AMMONIA in the last 168 hours. CBC:  Recent Labs Lab 12/28/15 0350 12/31/15 0740 01/02/16 0820  WBC 4.0 4.0 4.0  HGB 9.8* 9.7* 9.5*  HCT 30.1* 28.1* 27.9*  MCV 90.4 90.4 90.3  PLT 131* 123* 136*   Cardiac Enzymes:  Recent Labs Lab 01/01/16 1112 01/01/16 1428 01/01/16 2100  TROPONINI 0.04* 0.03 0.03   BNP: BNP (last 3 results)  Recent Labs  01/08/15 1929  BNP >4500.0*    ProBNP (last 3 results) No results for input(s): PROBNP in the last 8760 hours.  CBG: No results for input(s): GLUCAP in the last 168 hours.     SignedNita Sells MD   [facility] 01/03/2016, 10:42 AM

## 2016-01-03 NOTE — Progress Notes (Signed)
Patient refused orthostatic vitals at 0000.

## 2016-01-03 NOTE — Progress Notes (Signed)
[facility] Progress Note  Assessment/Plan: 1. Orthostatic lightheadness - head CT neg, ECHO normal except for EF 40-45%. Gave 3L NS and didn't improve his symptoms at all. EKG/ trop's neg on admission. MRI/ MRA brain unremarkable. Patient had been without BP medications for several weeks due to not having anyone to fill his medication planner-was extremely hypertensive when last seen in HD center 12/24/15- so could be just getting used to taking meds again ? Or just some deconditioning as well. Midodrine has been added- I also have stopped his AM clonidine and just give it at night. Will go to [name] upon DC. Believe this is best option for his care. PT seeing pt while inpatient.  2. ESRD - MWF at [facility]. For HD Friday if not d/c today 3. Anemia - HGB 9.5. Last ESA dose 12/19/15. Give Aranesp today. Follow HBG.  4. Secondary hyperparathyroidism - Ca 9.3 C Ca 10.5 Change binders to renvela.  5. HTN/volume - BP highish when lying but does go down wth sitting or standing.  On hydralazine 50 bid and midodrine 5 added Stopped  clonidine during the day. We may need to tolerate a higher BP sitting understanding that it goes down when he stands - refused orthostatics at 0000- I don't think they need to be done then!!! NET UF Wed was 1456 with post wt 61.1 6. Nutrition - Albumin 2.5. Renal diet. Add nepro/renal vits 7. TMax 99.9 - afeb this am  Disposition: [place] once medically stable- not sure what the endpoint is- is Ok for discharge by our standpoint  Myriam Jacobson, PA-C [place] 01/03/2016,9:38 AM  LOS: 8 days    Patient seen and examined, agree with above note with above modifications. Feels better- anticipate discharge today- HD tomorrow if still here- tolerating a high BP when lying understanding that he is orthostatic Corliss Parish, MD 01/03/2016     Subjective:   Trevor [name] feels better yet today- awaiting discharge to  [facility]- still orthostatic - HD yest- removed 1.5 liters   Objective Filed Vitals:   01/02/16 2016 01/02/16 2018 01/03/16 0038 01/03/16 0605  BP: 182/62 176/64 171/62 174/66  Pulse: 60  57 76  Temp: 99.9 F (37.7 C)  98.7 F (37.1 C) 98.8 F (37.1 C)  TempSrc: Oral  Oral Oral  Resp:    20  Height:      Weight:    60.51 kg (133 lb 6.4 oz)  SpO2: 100%  98% 100%   Physical Exam General: NAD Heart: RRR Lungs: mostly clear Abdomen: soft, non tender Extremities: no edema Dialysis Access: left upper AVF  Dialysis Orders: MonWedFri, 4 hrs 0 min, 180NRe Optiflux, BFR 400, DFR Autoflow 1.5, EDW 62 (kg), Dialysate 2.0 K, 2.25 Ca, UFR Profile: Profile 4, Sodium Model: None, Access: LUA AV Fistula Heparin: 1900 Units per treatment Mircera: 50 mcg IV q 2 weeks (last dose 12/19/15)  Additional Objective Labs: Basic Metabolic Panel:  Recent Labs Lab 12/28/15 0350 12/31/15 0740 01/02/16 0819  NA 140 139 138  K 4.1 4.8 5.0  CL 101 103 102  CO2 29 24 26   GLUCOSE 79 84 82  BUN 21* 38* 34*  CREATININE 6.23* 9.36* 8.61*  CALCIUM 8.7* 9.3 9.0  PHOS  --   --  5.9*   Liver Function Tests:  Recent Labs Lab 12/28/15 0350 01/02/16 0819  AST 11*  --   ALT 6*  --   ALKPHOS 66  --   BILITOT  0.5  --   PROT 5.6*  --   ALBUMIN 2.5* 2.8*   CBC:  Recent Labs Lab 12/28/15 0350 12/31/15 0740 01/02/16 0820  WBC 4.0 4.0 4.0  HGB 9.8* 9.7* 9.5*  HCT 30.1* 28.1* 27.9*  MCV 90.4 90.4 90.3  PLT 131* 123* 136*   Blood Culture    Component Value Date/Time   SDES HEMODIALYSIS LINE 12/27/2015 1340   SPECREQUEST BOTTLES DRAWN AEROBIC AND ANAEROBIC 10MLS 12/27/2015 1340   CULT NO GROWTH 5 DAYS 12/27/2015 1340   REPTSTATUS 01/01/2016 FINAL 12/27/2015 1340    Cardiac Enzymes:  Recent Labs Lab 01/01/16 1112 01/01/16 1428 01/01/16 2100  TROPONINI 0.04* 0.03 0.03   Medications:   . aspirin  81 mg Oral Daily  . atorvastatin  40 mg Oral q1800  . carvedilol  12.5 mg Oral BID  WC  . cloNIDine  0.1 mg Oral QHS  . clopidogrel  75 mg Oral Daily  . darbepoetin (ARANESP) injection - DIALYSIS  100 mcg Intravenous Q Wed-HD  . feeding supplement (NEPRO CARB STEADY)  237 mL Oral BID BM  . gabapentin  100 mg Oral QHS  . heparin  5,000 Units Subcutaneous 3 times per day  . hydrALAZINE  50 mg Oral BID  . midodrine  5 mg Oral TID WC  . multivitamin  1 tablet Oral QHS  . pantoprazole  40 mg Oral Daily  . polyethylene glycol  17 g Oral BID  . sevelamer carbonate  1,600 mg Oral TID WC

## 2016-01-07 ENCOUNTER — Encounter: Payer: Self-pay | Admitting: Internal Medicine

## 2016-01-07 ENCOUNTER — Non-Acute Institutional Stay: Payer: Medicare Other | Admitting: Internal Medicine

## 2016-01-07 DIAGNOSIS — R531 Weakness: Secondary | ICD-10-CM

## 2016-01-07 DIAGNOSIS — I428 Other cardiomyopathies: Secondary | ICD-10-CM

## 2016-01-07 DIAGNOSIS — I951 Orthostatic hypotension: Secondary | ICD-10-CM

## 2016-01-07 DIAGNOSIS — I429 Cardiomyopathy, unspecified: Secondary | ICD-10-CM | POA: Diagnosis not present

## 2016-01-07 DIAGNOSIS — F1410 Cocaine abuse, uncomplicated: Secondary | ICD-10-CM

## 2016-01-07 DIAGNOSIS — Z91199 Patient's noncompliance with other medical treatment and regimen due to unspecified reason: Secondary | ICD-10-CM

## 2016-01-07 DIAGNOSIS — I5042 Chronic combined systolic (congestive) and diastolic (congestive) heart failure: Secondary | ICD-10-CM

## 2016-01-07 DIAGNOSIS — E785 Hyperlipidemia, unspecified: Secondary | ICD-10-CM

## 2016-01-07 DIAGNOSIS — N186 End stage renal disease: Secondary | ICD-10-CM

## 2016-01-07 DIAGNOSIS — Z9119 Patient's noncompliance with other medical treatment and regimen: Secondary | ICD-10-CM

## 2016-01-07 DIAGNOSIS — Z8673 Personal history of transient ischemic attack (TIA), and cerebral infarction without residual deficits: Secondary | ICD-10-CM

## 2016-01-07 DIAGNOSIS — E43 Unspecified severe protein-calorie malnutrition: Secondary | ICD-10-CM

## 2016-01-07 DIAGNOSIS — I110 Hypertensive heart disease with heart failure: Secondary | ICD-10-CM | POA: Diagnosis not present

## 2016-01-07 DIAGNOSIS — Z992 Dependence on renal dialysis: Secondary | ICD-10-CM

## 2016-01-07 NOTE — Progress Notes (Signed)
MRN: [id number] Name: Trevor [name]  Sex: male Age: 69 y.o. DOB: 11-Feb-1946  [place] #: Karren Burly Facility/Room:119 Level Of Care: [name] Provider: Inocencio Homes D Emergency Contacts: Extended Emergency Contact Information Primary Emergency Contact: [name] Work Phone: [phone] Mobile Phone: [phone] Relation: Daughter Secondary Emergency Contact: Newman Nip [place] Phone: [phone] Relation: Son  Code Status:   Allergies: Review of patient's allergies indicates no known allergies.  Chief Complaint  Patient presents with  . New Admit To [name]    HPI: Patient is 70 y.o. male withESRD on HD MWF, H/o CVA, H/o Cardiomyopathy with improved EF to 55%, anxiety, hypertension presents to the ER with the above complaints. Patient reports that his last dialysis was on Monday. He had stopped all his home medications . He presented to the ED with generalized weakness. He was admitted to [facility] from 1/25-2/2 where he had a thorough evaluation yielding new dx of orthostatic hypotension. All other problems appeared to be at baseline. Pt is admiited to [name] for generalized weakness. While at [name] pt will be followed for CHF, tx with HD and coreg, prior stroke, tx with ADSA, plavix and statin and HLD, tx wit lovaza and lipitor.  Past Medical History  Diagnosis Date  . HTN (hypertension)   . Anxiety   . Cardiomyopathy   . Renal insufficiency   . Myocardial infarction ([place])   . CVA (cerebral infarction)   . Chest pain   . Shortness of breath   . Headache(784.0)   . Arthritis   . Blindness     Past Surgical History  Procedure Laterality Date  . Tee without cardioversion  10/09/2011    Procedure: TRANSESOPHAGEAL ECHOCARDIOGRAM (TEE);  Surgeon: Lelon Perla, MD;  Location: [facility] ENDOSCOPY;  Service: Cardiovascular;  Laterality: N/A;  . Right hand    . Insertion of dialysis catheter  09/21/2012    Procedure: INSERTION OF DIALYSIS  CATHETER;  Surgeon: Angelia Mould, MD;  Location: [place];  Service: Vascular;  Laterality: N/A;  Right Internal Jugular Placement  . Left heart catheterization with coronary angiogram N/A 09/28/2012    Procedure: LEFT HEART CATHETERIZATION WITH CORONARY ANGIOGRAM;  Surgeon: Sherren Mocha, MD;  Location: [facility] CATH LAB;  Service: Cardiovascular;  Laterality: N/A;  . Left heart catheterization with coronary angiogram N/A 07/07/2014    Procedure: LEFT HEART CATHETERIZATION WITH CORONARY ANGIOGRAM;  Surgeon: Leonie Man, MD;  Location: [facility] CATH LAB;  Service: Cardiovascular;  Laterality: N/A;      Medication List       This list is accurate as of: 01/07/16 11:59 PM.  Always use your most recent med list.               aspirin 81 MG chewable tablet  Chew 1 tablet (81 mg total) by mouth daily.     atorvastatin 40 MG tablet  Commonly known as:  LIPITOR  Take 1 tablet (40 mg total) by mouth daily at 6 PM.     calcium acetate 667 MG capsule  Commonly known as:  PHOSLO  Take 667 mg by mouth 3 (three) times daily with meals.     carvedilol 12.5 MG tablet  Commonly known as:  COREG  Take 1 tablet (12.5 mg total) by mouth 2 (two) times daily with a meal.     cloNIDine 0.1 MG tablet  Commonly known as:  CATAPRES  Take 1 tablet (0.1 mg total) by mouth at bedtime.  clopidogrel 75 MG tablet  Commonly known as:  PLAVIX  Take 1 tablet (75 mg total) by mouth daily.     DIALYVITE TABLET Tabs  Take 1 tablet by mouth daily.     gabapentin 100 MG capsule  Commonly known as:  NEURONTIN  Take 1 capsule (100 mg total) by mouth 3 (three) times daily.     hydrALAZINE 50 MG tablet  Commonly known as:  APRESOLINE  Take 1 tablet (50 mg total) by mouth 2 (two) times daily.     midodrine 5 MG tablet  Commonly known as:  PROAMATINE  Take 1 tablet (5 mg total) by mouth 3 (three) times daily with meals.     omega-3 acid ethyl esters 1 g capsule  Commonly known as:  LOVAZA  Take 1 capsule  (1 g total) by mouth 2 (two) times daily.     pantoprazole 40 MG tablet  Commonly known as:  PROTONIX  Take 1 tablet (40 mg total) by mouth daily.        No orders of the defined types were placed in this encounter.    Immunization History  Administered Date(s) Administered  . Influenza,inj,Quad PF,36+ Mos 08/14/2015  . Pneumococcal Polysaccharide-23 08/14/2015    Social History  Substance Use Topics  . Smoking status: Current Some Day Smoker -- 0.50 packs/day for 50 years    Types: Cigarettes  . Smokeless tobacco: Never Used  . Alcohol Use: No     Comment: Occasional    Family history is + DM2, ETOH abuse, MI   Review of Systems  DATA OBTAINED: from patient, nurse GENERAL:  no fevers, fatigue, appetite changes SKIN: No itching, rash or wounds EYES: No eye pain, redness, discharge EARS: No earache, tinnitus, change in hearing NOSE: No congestion, drainage or bleeding  MOUTH/THROAT: No mouth or tooth pain, No sore throat RESPIRATORY: No cough, wheezing, SOB CARDIAC: No chest pain, palpitations, lower extremity edema  GI: No abdominal pain, No N/V/D or constipation, No heartburn or reflux  GU: No dysuria, frequency or urgency, or incontinence  MUSCULOSKELETAL: No unrelieved bone/joint pain NEUROLOGIC: No headache, dizziness or focal weakness PSYCHIATRIC: No c/o anxiety or sadness   Filed Vitals:   01/12/16 1506  BP: 162/89  Pulse: 64  Temp: 98.8 F (37.1 C)  Resp: 18    SpO2 Readings from Last 1 Encounters:  01/12/16 98%        Physical Exam  GENERAL APPEARANCE: Alert, conversant,  No acute distress.  SKIN: No diaphoresis rash HEAD: Normocephalic, atraumatic  EYES: Conjunctiva/lids clear. Pupils round, reactive. EOMs intact.  EARS: External exam WNL, canals clear. Hearing grossly normal.  NOSE: No deformity or discharge.  MOUTH/THROAT: Lips w/o lesions  RESPIRATORY: Breathing is even, unlabored. Lung sounds are clear   CARDIOVASCULAR: Heart RRR no  murmurs, rubs or gallops. No peripheral edema.   GASTROINTESTINAL: Abdomen is soft, non-tender, not distended w/ normal bowel sounds. GENITOURINARY: Bladder non tender, not distended  MUSCULOSKELETAL: No abnormal joints or musculature NEUROLOGIC:  Cranial nerves 2-12 grossly intact. Moves all extremities  PSYCHIATRIC: Mood and affect appropriate to situation, no behavioral issues  Patient Active Problem List   Diagnosis Date Noted  . Orthostatic hypotension 01/12/2016  . Hyperlipidemia 01/12/2016  . Chest pain at rest   . ESRD (end stage renal disease) ([place])   . Cough   . Weakness generalized 12/26/2015  . Hypertensive heart disease with CHF (congestive heart failure) ([place])   . Chest pain 08/12/2015  . Malignant hypertension  08/12/2015  . Carotid stenosis   . TIA (transient ischemic attack) 04/04/2015  . Weakness 04/03/2015  . Stroke ([place]) 04/03/2015  . Abdominal aortic aneurysm ([place]) 03/06/2015  . Generalized weakness 09/08/2014  . Acute encephalopathy 09/07/2014  . CAD (coronary artery disease) 08/08/2014  . NSTEMI (non-ST elevated myocardial infarction) - Presumed Type II in setting of HTN Emergency 07/07/2014  . Hypertensive emergency 07/05/2014  . Acute on chronic combined systolic and diastolic CHF (congestive heart failure) ([place]) 07/05/2014  . Protein-calorie malnutrition, severe ([place]) 05/20/2014  . Hypokalemia 05/19/2014  . Syncope and collapse 05/19/2014  . ESRD on hemodialysis ([place]) 05/19/2014  . Anemia of renal disease 05/19/2014  . Coronary artery disease, non-occlusive: Cath 2013 - Moderate D1 & RCA disease 09/28/2012  . Anemia 09/27/2012  . Secondary hyperparathyroidism ([place]) 09/27/2012  . Non compliance with medical treatment 09/27/2012  . Chronic combined systolic and diastolic CHF (congestive heart failure) ([place]) 10/10/2011  . A-fib ([place]) 10/08/2011  . History of stroke 10/07/2011  . Cocaine abuse 10/07/2011  . Severe sinus bradycardia 10/07/2011  .  Nonischemic cardiomyopathy ([place]) 09/18/2011  . Tobacco use disorder 08/07/2011  . Bruit 08/07/2011    CBC    Component Value Date/Time   WBC 4.0 01/02/2016 0820   RBC 3.09* 01/02/2016 0820   RBC 3.43* 05/20/2014 0630   HGB 9.5* 01/02/2016 0820   HCT 27.9* 01/02/2016 0820   PLT 136* 01/02/2016 0820   MCV 90.3 01/02/2016 0820   LYMPHSABS 0.8 07/30/2015 0546   MONOABS 0.5 07/30/2015 0546   EOSABS 0.1 07/30/2015 0546   BASOSABS 0.1 07/30/2015 0546    CMP     Component Value Date/Time   NA 138 01/02/2016 0819   K 5.0 01/02/2016 0819   CL 102 01/02/2016 0819   CO2 26 01/02/2016 0819   GLUCOSE 82 01/02/2016 0819   BUN 34* 01/02/2016 0819   CREATININE 8.61* 01/02/2016 0819   CALCIUM 9.0 01/02/2016 0819   PROT 5.6* 12/28/2015 0350   ALBUMIN 2.8* 01/02/2016 0819   AST 11* 12/28/2015 0350   ALT 6* 12/28/2015 0350   ALKPHOS 66 12/28/2015 0350   BILITOT 0.5 12/28/2015 0350   GFRNONAA 5* 01/02/2016 0819   GFRAA 6* 01/02/2016 0819    Lab Results  Component Value Date   HGBA1C 4.9 04/04/2015     Dg Chest 2 View  12/26/2015  CLINICAL DATA:  Generalized weakness EXAM: CHEST  2 VIEW COMPARISON:  08/12/2015 FINDINGS: Cardiomegaly. Normal vascularity. New oval density over the right upper lung zone may reflect an overlying object. Minimal basilar hypoaeration change. IMPRESSION: Right upper lobe nodular density may reflect an overlying object. Repeat after removal of overlying objects is recommended. Cardiomegaly without edema. Electronically Signed   By: Marybelle Killings M.D.   On: 12/26/2015 14:04   Ct Head Wo Contrast  12/26/2015  CLINICAL DATA:  Generalized weakness EXAM: CT HEAD WITHOUT CONTRAST TECHNIQUE: Contiguous axial images were obtained from the base of the skull through the vertex without intravenous contrast. COMPARISON:  04/04/2015.  04/03/2015. FINDINGS: Encephalomalacia in the left frontal and right occipital lobes is stable. Chronic ischemic changes in the periventricular  white matter. No mass effect, midline shift, or acute hemorrhage. Mastoid air cells are clear. Visualized paranasal sinuses are clear. IMPRESSION: Chronic ischemic changes.  No acute intracranial pathology. Electronically Signed   By: Marybelle Killings M.D.   On: 12/26/2015 13:28   Dg Chest Port 1 View  12/27/2015  CLINICAL DATA:  Productive cough for 2 days,  weakness - hx of MI, stroke, htn, SOB EXAM: PORTABLE CHEST 1 VIEW COMPARISON:  12/25/2013 FINDINGS: The heart size and mediastinal contours are within normal limits. Both lungs are clear. The visualized skeletal structures are unremarkable. IMPRESSION: No active disease. Electronically Signed   By: Nolon Nations M.D.   On: 12/27/2015 12:21    Not all labs, radiology exams or other studies done during hospitalization come through on my EPIC note; however they are reviewed by me.    Assessment and Plan  Generalized weakness , likely related to orthostatic hypotension,  systolic blood pressure dropped during admission multiple times  Workup for infectious causes was negative -It is possible that resuming all his home medicines yesterday after a long gap could be potentially contributing, - no focal neurological symptoms, no meningeal signs  PT/OT evaluation recommended skilled nursing care and placement MRI MRA of the brain to rule out CVA, vertebrobasilar insufficiency, cervical spinal stenosis? 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 (cont ASA, plavix ) Started midodrine 5 mg 3 times a day for orthostatic Hypotension, DC'd flomax, proscar >18% incidence of orthostasis and was discontinued, discontinued a.m. clonidine, and stop clonidine during the day  PT eval for vestibular rehabilitation    Orthostatic hypotension , likely related to orthostatic hypotension,  systolic blood pressure dropped during admission multiple times  Workup for infectious causes was negative -It is possible that resuming all his home  medicines yesterday after a long gap could be potentially contributing, - no focal neurological symptoms, no meningeal signs  PT/OT evaluation recommended skilled nursing care and placement MRI MRA of the brain to rule out CVA, vertebrobasilar insufficiency, cervical spinal stenosis? 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 (cont ASA, plavix ) Started midodrine 5 mg 3 times a day for orthostatic Hypotension, DC'd flomax, proscar >18% incidence of orthostasis and was discontinued, discontinued a.m. clonidine, and stop clonidine during the day  PT eval for vestibular rehabilitation   Nonischemic cardiomyopathy ([place]) On 08/15/15 EF was 55-60%, repeat 2-D echo on 1/27 shows EF of 45-50% -History of non-ST elevation MI 08/17/15 that showed an EF of 19%, [name] -  continue with aspirin Coreg and Plavix, statin, Coreg has been down titrated to 12.5 twice a day  Hypertensive heart disease with CHF (congestive heart failure) ([place]) currently on Coreg, hydralazine, clonidine, nephrology to titrate these further Discharge doses are Coreg 12.5 twice a day hydralazine 50 3 times a day changed to 50 twice a day Imdur 90 discontinued Clonidine 0.1 only daily at bedtime instead of twice a day Recommend higher blood pressures as an outpatient with systolics in the [id number] to [id number] [name]- WILL CONT WITH NEW DOSAGES  ESRD on hemodialysis [facility]) [name]  -CONT Monday Wednesday Friday   History of stroke exam is nonfocal, CT unremarkable,MRI old CVA [name] - Continue ASA and Plavix   Protein-calorie malnutrition, severe ([place]) [name] - appropriate diet with protein supplements  Chronic combined systolic and diastolic CHF (congestive heart failure) ([facility]) [name] - volume controlled wth HD; cont Coreg 12. 5 mf BID  Cocaine abuse Noted  Non compliance with medical treatment Noted  Hyperlipidemia [name] - not stated as uncontrolled ;cont lovaza and lipitor 40 mg daily   Time spent > 45 min;> 50% of time  with patient was spent reviewing records, labs, tests and studies, counseling and developing plan of care  Hennie Duos, MD

## 2016-01-12 ENCOUNTER — Encounter: Payer: Self-pay | Admitting: Internal Medicine

## 2016-01-12 DIAGNOSIS — I951 Orthostatic hypotension: Secondary | ICD-10-CM | POA: Insufficient documentation

## 2016-01-12 DIAGNOSIS — E785 Hyperlipidemia, unspecified: Secondary | ICD-10-CM | POA: Insufficient documentation

## 2016-01-12 NOTE — Assessment & Plan Note (Signed)
[name]  -CONT Monday Wednesday Friday

## 2016-01-12 NOTE — Assessment & Plan Note (Signed)
currently on Coreg, hydralazine, clonidine, nephrology to titrate these further Discharge doses are Coreg 12.5 twice a day hydralazine 50 3 times a day changed to 50 twice a day Imdur 90 discontinued Clonidine 0.1 only daily at bedtime instead of twice a day Recommend higher blood pressures as an outpatient with systolics in the [id number] to [id number] [name]- WILL CONT WITH NEW DOSAGES

## 2016-01-12 NOTE — Assessment & Plan Note (Signed)
exam is nonfocal, CT unremarkable,MRI old CVA [name] - Continue ASA and Plavix

## 2016-01-12 NOTE — Assessment & Plan Note (Signed)
Noted  

## 2016-01-12 NOTE — Assessment & Plan Note (Signed)
[name] - volume controlled wth HD; cont Coreg 12. 5 mf BID

## 2016-01-12 NOTE — Assessment & Plan Note (Signed)
[name] - appropriate diet with protein supplements

## 2016-01-12 NOTE — Assessment & Plan Note (Signed)
,   likely related to orthostatic hypotension,  systolic blood pressure dropped during admission multiple times  Workup for infectious causes was negative -It is possible that resuming all his home medicines yesterday after a long gap could be potentially contributing, - no focal neurological symptoms, no meningeal signs  PT/OT evaluation recommended skilled nursing care and placement MRI MRA of the brain to rule out CVA, vertebrobasilar insufficiency, cervical spinal stenosis? 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 (cont ASA, plavix ) Started midodrine 5 mg 3 times a day for orthostatic Hypotension, DC'd flomax, proscar >18% incidence of orthostasis and was discontinued, discontinued a.m. clonidine, and stop clonidine during the day  PT eval for vestibular rehabilitation

## 2016-01-12 NOTE — Assessment & Plan Note (Signed)
[name] - not stated as uncontrolled ;cont lovaza and lipitor 40 mg daily

## 2016-01-12 NOTE — Assessment & Plan Note (Signed)
On 08/15/15 EF was 55-60%, repeat 2-D echo on 1/27 shows EF of 45-50% -History of non-ST elevation MI 08/17/15 that showed an EF of 19%, [name] -  continue with aspirin Coreg and Plavix, statin, Coreg has been down titrated to 12.5 twice a day

## 2016-02-06 ENCOUNTER — Non-Acute Institutional Stay (SKILLED_NURSING_FACILITY): Payer: Medicare Other | Admitting: Adult Health

## 2016-02-06 ENCOUNTER — Encounter: Payer: Self-pay | Admitting: Adult Health

## 2016-02-06 DIAGNOSIS — I110 Hypertensive heart disease with heart failure: Secondary | ICD-10-CM | POA: Diagnosis not present

## 2016-02-06 DIAGNOSIS — I5042 Chronic combined systolic (congestive) and diastolic (congestive) heart failure: Secondary | ICD-10-CM | POA: Diagnosis not present

## 2016-02-06 DIAGNOSIS — N186 End stage renal disease: Secondary | ICD-10-CM

## 2016-02-06 NOTE — Progress Notes (Signed)
Patient ID: Trevor [name], male   DOB: Feb 06, 1946, 70 y.o.   MRN: [id number]   Facility:  [facility]       No Known Allergies  Chief Complaint  Patient presents with  . Discharge Note    Discharge from facility    HPI:  He is being discharged to home with home health for pt/ot. He will not need dme. He will need his prescriptions to be written and he will need to have a follow up appointment setup for him. There are no nursing concerns at this time.   Past Medical History  Diagnosis Date  . HTN (hypertension)   . Anxiety   . Cardiomyopathy   . Renal insufficiency   . Myocardial infarction ([place])   . CVA (cerebral infarction)   . Chest pain   . Shortness of breath   . Headache(784.0)   . Arthritis   . Blindness     Past Surgical History  Procedure Laterality Date  . Tee without cardioversion  10/09/2011    Procedure: TRANSESOPHAGEAL ECHOCARDIOGRAM (TEE);  Surgeon: Lelon Perla, MD;  Location: [facility] ENDOSCOPY;  Service: Cardiovascular;  Laterality: N/A;  . Right hand    . Insertion of dialysis catheter  09/21/2012    Procedure: INSERTION OF DIALYSIS CATHETER;  Surgeon: Angelia Mould, MD;  Location: [facility];  Service: Vascular;  Laterality: N/A;  Right Internal Jugular Placement  . Left heart catheterization with coronary angiogram N/A 09/28/2012    Procedure: LEFT HEART CATHETERIZATION WITH CORONARY ANGIOGRAM;  Surgeon: Sherren Mocha, MD;  Location: [facility] CATH LAB;  Service: Cardiovascular;  Laterality: N/A;  . Left heart catheterization with coronary angiogram N/A 07/07/2014    Procedure: LEFT HEART CATHETERIZATION WITH CORONARY ANGIOGRAM;  Surgeon: Leonie Man, MD;  Location: [facility] CATH LAB;  Service: Cardiovascular;  Laterality: N/A;    VITAL SIGNS BP 135/63 mmHg  Pulse 62  Temp(Src) 98.2 F (36.8 C) (Oral)  Resp 18  Ht 5\' 10"  (1.778 m)  Wt 133 lb (60.328 kg)  BMI 19.08 kg/m2  SpO2 98%  Patient's Medications  New Prescriptions   No medications on file    Previous Medications   ASPIRIN 81 MG CHEWABLE TABLET    Chew 1 tablet (81 mg total) by mouth daily.   ATORVASTATIN (LIPITOR) 40 MG TABLET    Take 1 tablet (40 mg total) by mouth daily at 6 PM.   B COMPLEX-C-FOLIC ACID (DIALYVITE TABLET) TABS    Take 1 tablet by mouth daily.   CALCIUM ACETATE (PHOSLO) 667 MG CAPSULE    Take 667 mg by mouth 3 (three) times daily with meals.   CARVEDILOL (COREG) 6.25 MG TABLET    Take 6.25 mg by mouth 2 (two) times daily with a meal.   CINACALCET (SENSIPAR) 60 MG TABLET    Take 60 mg by mouth daily.   CLONIDINE (CATAPRES) 0.1 MG TABLET    Take 1 tablet (0.1 mg total) by mouth at bedtime.   CLOPIDOGREL (PLAVIX) 75 MG TABLET    Take 1 tablet (75 mg total) by mouth daily.   GABAPENTIN (NEURONTIN) 100 MG CAPSULE    Take 1 capsule (100 mg total) by mouth 3 (three) times daily.   HYDRALAZINE (APRESOLINE) 50 MG TABLET    Take 1 tablet (50 mg total) by mouth 2 (two) times daily.   MIDODRINE (PROAMATINE) 5 MG TABLET    Take 1 tablet (5 mg total) by mouth 3 (three) times daily with meals.   OMEGA-3 ACID  ETHYL ESTERS (LOVAZA) 1 G CAPSULE    Take 1 capsule (1 g total) by mouth 2 (two) times daily.   PANTOPRAZOLE (PROTONIX) 40 MG TABLET    Take 1 tablet (40 mg total) by mouth daily.   TAMSULOSIN (FLOMAX) 0.4 MG CAPS CAPSULE    Take 0.4 mg by mouth 2 (two) times daily.  Modified Medications   No medications on file  Discontinued Medications   CARVEDILOL (COREG) 12.5 MG TABLET    Take 1 tablet (12.5 mg total) by mouth 2 (two) times daily with a meal.     SIGNIFICANT DIAGNOSTIC EXAMS   Review of Systems  Constitutional: Negative for malaise/fatigue.  Respiratory: Negative for cough and shortness of breath.   Cardiovascular: Negative for chest pain, palpitations and leg swelling.  Gastrointestinal: Negative for heartburn, abdominal pain and constipation.  Musculoskeletal: Negative for myalgias, back pain and joint pain.  Skin: Negative.   Neurological: Negative for  dizziness.  Psychiatric/Behavioral: The patient is not nervous/anxious.      Physical Exam  Constitutional: No distress.  Eyes: Conjunctivae are normal.  Neck: Neck supple. No JVD present. No thyromegaly present.  Cardiovascular: Normal rate, regular rhythm and intact distal pulses.   Respiratory: Effort normal and breath sounds normal. No respiratory distress. He has no wheezes.  GI: Soft. Bowel sounds are normal. He exhibits no distension. There is no tenderness.  Musculoskeletal: He exhibits no edema.  Able to move all extremities   Lymphadenopathy:    He has no cervical adenopathy.  Neurological: He is alert.  Skin: Skin is warm and dry. He is not diaphoretic.  Psychiatric: He has a normal mood and affect.     ASSESSMENT/ PLAN:  Will discharge to home with home health for pt/ot to evaluate and treat as indicated for gait, balance, strength; adl training. He will not need dme. His prescriptions have been written for a 30 day supply of his medications. The facility is setup follow up appointment within the next 1-2 weeks.     Time spent with patient 40   minutes >50% time spent counseling; reviewing medical record; tests; labs; and developing future plan of care   Ok Edwards NP [facility]  Contact [phone] Monday through Friday 8am- 5pm  After hours call [phone]

## 2016-03-08 ENCOUNTER — Inpatient Hospital Stay (HOSPITAL_COMMUNITY)
Admission: EM | Admit: 2016-03-08 | Discharge: 2016-03-12 | DRG: 302 | Disposition: A | Discharge status: Skilled Nursing Facility | Payer: Medicare Other | Attending: Internal Medicine | Admitting: Internal Medicine

## 2016-03-08 ENCOUNTER — Encounter (HOSPITAL_COMMUNITY): Payer: Self-pay | Admitting: Emergency Medicine

## 2016-03-08 ENCOUNTER — Emergency Department (HOSPITAL_COMMUNITY): Payer: Medicare Other

## 2016-03-08 DIAGNOSIS — W19XXXA Unspecified fall, initial encounter: Secondary | ICD-10-CM | POA: Diagnosis present

## 2016-03-08 DIAGNOSIS — Z7902 Long term (current) use of antithrombotics/antiplatelets: Secondary | ICD-10-CM

## 2016-03-08 DIAGNOSIS — S62316A Displaced fracture of base of fifth metacarpal bone, right hand, initial encounter for closed fracture: Secondary | ICD-10-CM | POA: Diagnosis present

## 2016-03-08 DIAGNOSIS — R61 Generalized hyperhidrosis: Secondary | ICD-10-CM | POA: Diagnosis not present

## 2016-03-08 DIAGNOSIS — R110 Nausea: Secondary | ICD-10-CM | POA: Diagnosis not present

## 2016-03-08 DIAGNOSIS — R079 Chest pain, unspecified: Secondary | ICD-10-CM | POA: Diagnosis not present

## 2016-03-08 DIAGNOSIS — D631 Anemia in chronic kidney disease: Secondary | ICD-10-CM | POA: Diagnosis not present

## 2016-03-08 DIAGNOSIS — N186 End stage renal disease: Secondary | ICD-10-CM | POA: Diagnosis present

## 2016-03-08 DIAGNOSIS — Z681 Body mass index (BMI) 19 or less, adult: Secondary | ICD-10-CM

## 2016-03-08 DIAGNOSIS — Z720 Tobacco use: Secondary | ICD-10-CM | POA: Diagnosis present

## 2016-03-08 DIAGNOSIS — I5042 Chronic combined systolic (congestive) and diastolic (congestive) heart failure: Secondary | ICD-10-CM | POA: Diagnosis present

## 2016-03-08 DIAGNOSIS — R0789 Other chest pain: Secondary | ICD-10-CM | POA: Diagnosis not present

## 2016-03-08 DIAGNOSIS — R072 Precordial pain: Secondary | ICD-10-CM | POA: Diagnosis not present

## 2016-03-08 DIAGNOSIS — E43 Unspecified severe protein-calorie malnutrition: Secondary | ICD-10-CM | POA: Diagnosis present

## 2016-03-08 DIAGNOSIS — I132 Hypertensive heart and chronic kidney disease with heart failure and with stage 5 chronic kidney disease, or end stage renal disease: Secondary | ICD-10-CM | POA: Diagnosis present

## 2016-03-08 DIAGNOSIS — I2510 Atherosclerotic heart disease of native coronary artery without angina pectoris: Secondary | ICD-10-CM | POA: Diagnosis not present

## 2016-03-08 DIAGNOSIS — N189 Chronic kidney disease, unspecified: Secondary | ICD-10-CM

## 2016-03-08 DIAGNOSIS — I10 Essential (primary) hypertension: Secondary | ICD-10-CM | POA: Diagnosis present

## 2016-03-08 DIAGNOSIS — Z7982 Long term (current) use of aspirin: Secondary | ICD-10-CM

## 2016-03-08 DIAGNOSIS — I5043 Acute on chronic combined systolic (congestive) and diastolic (congestive) heart failure: Secondary | ICD-10-CM | POA: Diagnosis present

## 2016-03-08 DIAGNOSIS — Z79899 Other long term (current) drug therapy: Secondary | ICD-10-CM

## 2016-03-08 DIAGNOSIS — Z8673 Personal history of transient ischemic attack (TIA), and cerebral infarction without residual deficits: Secondary | ICD-10-CM

## 2016-03-08 DIAGNOSIS — R0602 Shortness of breath: Secondary | ICD-10-CM | POA: Diagnosis not present

## 2016-03-08 DIAGNOSIS — F17200 Nicotine dependence, unspecified, uncomplicated: Secondary | ICD-10-CM | POA: Diagnosis present

## 2016-03-08 DIAGNOSIS — Z992 Dependence on renal dialysis: Secondary | ICD-10-CM

## 2016-03-08 DIAGNOSIS — I4891 Unspecified atrial fibrillation: Secondary | ICD-10-CM | POA: Diagnosis not present

## 2016-03-08 HISTORY — DX: Dependence on renal dialysis: Z99.2

## 2016-03-08 HISTORY — DX: Dependence on renal dialysis: N18.6

## 2016-03-08 LAB — CBC
HEMATOCRIT: 29.1 % — AB (ref 39.0–52.0)
HEMOGLOBIN: 10 g/dL — AB (ref 13.0–17.0)
MCH: 29.5 pg (ref 26.0–34.0)
MCHC: 34.4 g/dL (ref 30.0–36.0)
MCV: 85.8 fL (ref 78.0–100.0)
Platelets: 136 10*3/uL — ABNORMAL LOW (ref 150–400)
RBC: 3.39 MIL/uL — AB (ref 4.22–5.81)
RDW: 13.4 % (ref 11.5–15.5)
WBC: 4.7 10*3/uL (ref 4.0–10.5)

## 2016-03-08 LAB — BASIC METABOLIC PANEL
ANION GAP: 12 (ref 5–15)
BUN: 29 mg/dL — ABNORMAL HIGH (ref 6–20)
CHLORIDE: 106 mmol/L (ref 101–111)
CO2: 21 mmol/L — AB (ref 22–32)
Calcium: 8.7 mg/dL — ABNORMAL LOW (ref 8.9–10.3)
Creatinine, Ser: 6.56 mg/dL — ABNORMAL HIGH (ref 0.61–1.24)
GFR calc non Af Amer: 8 mL/min — ABNORMAL LOW (ref >60)
GFR, EST AFRICAN AMERICAN: 9 mL/min — AB (ref >60)
Glucose, Bld: 84 mg/dL (ref 65–99)
POTASSIUM: 3.7 mmol/L (ref 3.5–5.1)
SODIUM: 139 mmol/L (ref 135–145)

## 2016-03-08 LAB — I-STAT TROPONIN, ED: TROPONIN I, POC: 0.04 ng/mL (ref 0.00–0.08)

## 2016-03-08 MED ORDER — CARVEDILOL 6.25 MG PO TABS
6.2500 mg | ORAL_TABLET | Freq: Two times a day (BID) | ORAL | Status: DC
  Administered 2016-03-08 – 2016-03-11 (×4): 6.25 mg via ORAL
  Filled 2016-03-08 (×5): qty 1

## 2016-03-08 MED ORDER — NITROGLYCERIN 2 % TD OINT
1.0000 [in_us] | TOPICAL_OINTMENT | Freq: Four times a day (QID) | TRANSDERMAL | Status: DC
  Administered 2016-03-08: 1 [in_us] via TOPICAL
  Filled 2016-03-08: qty 1

## 2016-03-08 NOTE — ED Notes (Signed)
PER [facility]: Patient to ED from home c/o central, non-radiating CP onset 45 minutes ago accompanied by dizziness, nausea, and SOB. Pt states that he has had this CP before, but was never dx with anything. Pt is dialysis pt, last treatment yesterday. EMS VS: 99% RA, HR 116 ST, 162/90, RR 18. 20g. RFA, EMS gave 4mg  zofran, 2.4 NTG (relieved pain from 9/10 to 3/10), and 324 ASA. Patient A&O x 4.

## 2016-03-08 NOTE — Consult Note (Addendum)
Reason for Consult: Chest Pain   Referring Physician: [place]  PCP:  [name], MD  Primary Cardiologist:[facility]  Chief Complaint: Chest Pain  HPI:   Trevor [name] is a 70 year old male with history of HTN, ESRD on HD, systolic CHF (69-48%), CVA, Non-obstructive CAD, remote cocaine use who presents with HTN and chest pain.  He was at his house when he had acute onset of chest pain. EMS was called and his pain was responsive to NTG SL.  No associated SOB, N/V, diaphoresis, orthopnea.  Notes some involvement of MSK (chest wall tenderness).    He notes this has been something he's dealt with for some time where his BP is elevated and he experiences chest pain-- which this reminds him of.  He notes his BP has been elevated this week.    Previous workup up includes negative nuclear stress 2016 (this also showed very low EF, later shown to be normal) and cath 2015 showed 50-70% left circumflex with no significant obstruction by FFR, 60% diagonal.   Cardiology Tests TTE 12/28/2015 - Left ventricle: The cavity size was normal. There was moderate  concentric hypertrophy. Systolic function was mildly reduced. The  estimated ejection fraction was in the range of 45% to 50%. Mild  diffuse hypokinesis with no identifiable regional variations. Doppler parameters are consistent with abnormal left ventricular  relaxation (grade 1 diastolic dysfunction). - Mitral valve: Calcified annulus. - Left atrium: The atrium was mildly dilated.   LHC 07/07/2014  Moderate to severe single-vessel disease involving the early mid circumflex with a roughly 60% stenosis, not physiologically significant by FFR. Otherwise nonobstructive CAD throughout  Surprisingly improved LVEF by the ventriculography compared to previous evaluations  Normal LVEDP with borderline systemic hypotension  Past Medical History  Diagnosis Date  . HTN (hypertension)   . Anxiety   . Cardiomyopathy   . Renal  insufficiency   . Myocardial infarction ([place])   . CVA (cerebral infarction)   . Chest pain   . Shortness of breath   . Headache(784.0)   . Arthritis   . Blindness     Past Surgical History  Procedure Laterality Date  . Tee without cardioversion  10/09/2011    Procedure: TRANSESOPHAGEAL ECHOCARDIOGRAM (TEE);  Surgeon: Lelon Perla, MD;  Location: [facility] ENDOSCOPY;  Service: Cardiovascular;  Laterality: N/A;  . Right hand    . Insertion of dialysis catheter  09/21/2012    Procedure: INSERTION OF DIALYSIS CATHETER;  Surgeon: Angelia Mould, MD;  Location: [facility];  Service: Vascular;  Laterality: N/A;  Right Internal Jugular Placement  . Left heart catheterization with coronary angiogram N/A 09/28/2012    Procedure: LEFT HEART CATHETERIZATION WITH CORONARY ANGIOGRAM;  Surgeon: Sherren Mocha, MD;  Location: [facility] CATH LAB;  Service: Cardiovascular;  Laterality: N/A;  . Left heart catheterization with coronary angiogram N/A 07/07/2014    Procedure: LEFT HEART CATHETERIZATION WITH CORONARY ANGIOGRAM;  Surgeon: Leonie Man, MD;  Location: [facility] CATH LAB;  Service: Cardiovascular;  Laterality: N/A;    Family History  Problem Relation Age of Onset  . Heart attack Mother     MI in her 43s  . Diabetes Mother   . Anesthesia problems Neg Hx   . Hypotension Neg Hx   . Malignant hyperthermia Neg Hx   . Pseudochol deficiency Neg Hx   . Alcohol abuse Father    Social History:  reports that he has been smoking Cigarettes.  He has  a 25 pack-year smoking history. He has never used smokeless tobacco. He reports that he does not drink alcohol or use illicit drugs.  Allergies: No Known Allergies   Current facility-administered medications:  .  nitroGLYCERIN (NITROGLYN) 2 % ointment 1 inch, 1 inch, Topical, 4 times per day, Theressa Millard, MD  Current outpatient prescriptions:  .  aspirin 81 MG chewable tablet, Chew 1 tablet (81 mg total) by mouth daily., Disp: , Rfl:  .  atorvastatin  (LIPITOR) 40 MG tablet, Take 1 tablet (40 mg total) by mouth daily at 6 PM., Disp: , Rfl:  .  B Complex-C-Folic Acid (DIALYVITE TABLET) TABS, Take 1 tablet by mouth daily., Disp: , Rfl: 0 .  calcium acetate (PHOSLO) 667 MG capsule, Take 667 mg by mouth 3 (three) times daily with meals., Disp: , Rfl:  .  carvedilol (COREG) 6.25 MG tablet, Take 6.25 mg by mouth 2 (two) times daily with a meal., Disp: , Rfl:  .  cinacalcet (SENSIPAR) 60 MG tablet, Take 60 mg by mouth daily., Disp: , Rfl:  .  cloNIDine (CATAPRES) 0.1 MG tablet, Take 1 tablet (0.1 mg total) by mouth at bedtime., Disp: 60 tablet, Rfl: 0 .  clopidogrel (PLAVIX) 75 MG tablet, Take 1 tablet (75 mg total) by mouth daily., Disp: 30 tablet, Rfl: 2 .  gabapentin (NEURONTIN) 100 MG capsule, Take 1 capsule (100 mg total) by mouth 3 (three) times daily., Disp: 90 capsule, Rfl: 2 .  hydrALAZINE (APRESOLINE) 50 MG tablet, Take 1 tablet (50 mg total) by mouth 2 (two) times daily., Disp: 90 tablet, Rfl: 3 .  midodrine (PROAMATINE) 5 MG tablet, Take 1 tablet (5 mg total) by mouth 3 (three) times daily with meals. (Patient not taking: Reported on 02/06/2016), Disp: , Rfl:  .  omega-3 acid ethyl esters (LOVAZA) 1 G capsule, Take 1 capsule (1 g total) by mouth 2 (two) times daily., Disp: 60 capsule, Rfl: 2 .  pantoprazole (PROTONIX) 40 MG tablet, Take 1 tablet (40 mg total) by mouth daily., Disp: , Rfl:  .  tamsulosin (FLOMAX) 0.4 MG CAPS capsule, Take 0.4 mg by mouth 2 (two) times daily., Disp: , Rfl:    Results for orders placed or performed during the hospital encounter of 03/08/16 (from the past 48 hour(s))  Basic metabolic panel     Status: Abnormal   Collection Time: 03/08/16  9:11 PM  Result Value Ref Range   Sodium 139 135 - 145 mmol/L   Potassium 3.7 3.5 - 5.1 mmol/L   Chloride 106 101 - 111 mmol/L   CO2 21 (L) 22 - 32 mmol/L   Glucose, Bld 84 65 - 99 mg/dL   BUN 29 (H) 6 - 20 mg/dL   Creatinine, Ser 6.56 (H) 0.61 - 1.24 mg/dL   Calcium 8.7  (L) 8.9 - 10.3 mg/dL   GFR calc non Af Amer 8 (L) >60 mL/min   GFR calc Af Amer 9 (L) >60 mL/min    Comment: (NOTE) The eGFR has been calculated using the CKD EPI equation. This calculation has not been validated in all clinical situations. eGFR's persistently <60 mL/min signify possible Chronic Kidney Disease.    Anion gap 12 5 - 15  CBC     Status: Abnormal   Collection Time: 03/08/16  9:11 PM  Result Value Ref Range   WBC 4.7 4.0 - 10.5 K/uL   RBC 3.39 (L) 4.22 - 5.81 MIL/uL   Hemoglobin 10.0 (L) 13.0 - 17.0 g/dL  HCT 29.1 (L) 39.0 - 52.0 %   MCV 85.8 78.0 - 100.0 fL   MCH 29.5 26.0 - 34.0 pg   MCHC 34.4 30.0 - 36.0 g/dL   RDW 13.4 11.5 - 15.5 %   Platelets 136 (L) 150 - 400 K/uL  I-stat troponin, ED     Status: None   Collection Time: 03/08/16  9:16 PM  Result Value Ref Range   Troponin i, poc 0.04 0.00 - 0.08 ng/mL   Comment 3            Comment: Due to the release kinetics of cTnI, a negative result within the first hours of the onset of symptoms does not rule out myocardial infarction with certainty. If myocardial infarction is still suspected, repeat the test at appropriate intervals.    Dg Chest 2 View  03/08/2016  CLINICAL DATA:  Shortness of breath and chest pain EXAM: CHEST  2 VIEW COMPARISON:  01/01/2016 FINDINGS: Normal heart size. No pleural effusion or edema. Aortic atherosclerosis noted. No airspace opacities identified. IMPRESSION: 1. No acute cardiopulmonary abnormalities. 2. Aortic atherosclerosis. Electronically Signed   By: Kerby Moors M.D.   On: 03/08/2016 21:26    ROS: ROS negative unless per HPI   Blood pressure 184/80, pulse 75, resp. rate 24, SpO2 99 %.  Wt Readings from Last 3 Encounters:  02/06/16 60.328 kg (133 lb)  01/03/16 60.51 kg (133 lb 6.4 oz)  08/18/15 57.652 kg (127 lb 1.6 oz)   ECG: NSR with LVH w/ repolarization abnormality  PE:  NAD EOMI (but very poor visual acuity), mucous membranes moist RRR, 2/6 SEM- no  gallops Lungs clear, no crackles Abd soft non tender No LE edema Chest pain reproducible on exam Alert/oriented  Assessment/Plan  70 YO male with history of poorly treated HTN with ESRD presenting with exacerbation of chronic chest pain in the setting of negative troponin, stable ECG with chronic LVH with repolarization abnormality.  There are atypical features of his pain including reproducible chest pain with palpation on exam.  BP elevated in ED up to 979G systolic. Suspect NTG aided pain more so from a BP lowering standpoint.  Many BP medications recently decreased due to previous issues with orthostasis-like symptoms.  (Previously was on TID hydral and Imdur and coreg 12.5 mg BID). He probably has longtime cerebral autoregulation from chronic HTN, goal BP may be 150-160s, possibly higher.   Chest Pain Likely due to HTN, also possible MSK component - Cycle Troponins - Daily ECG  - Telemetry - Improve BP control: current regimen is coreg 6.26 BID, clonidine 0.1 mg, hydralazine 50 BID. ------- Recommend increasing clonidine to BID to start, daily clonidine dosing may have some wash-out and reflex HTN - Continue ASA, plavix, coreg, hydralazine, atorvastatin - Check urine tox screen for cocaine - No indication for ischemic evaluation or LVEF assessment at this time  Hypertension - as above  Combined chronic systolic and diastolic CHF - Manage volume via dialysis  Trevor Cram Means  MD 03/08/2016, 11:01 PM

## 2016-03-08 NOTE — ED Provider Notes (Signed)
CSN: [id number]     Arrival date & time 03/08/16  2010 History   First MD Initiated Contact with Patient 03/08/16 2037     Chief Complaint  Patient presents with  . Chest Pain     (Consider location/radiation/quality/duration/timing/severity/associated sxs/prior Treatment) Patient is a 70 y.o. male presenting with chest pain. The history is provided by the patient.  Chest Pain Pain location:  L chest and substernal area Pain quality: aching, crushing and pressure   Pain radiates to:  Does not radiate Pain radiates to the back: no   Pain severity:  Severe Onset quality:  Gradual Duration:  1 hour Timing:  Constant Progression:  Resolved (resolved after nitro) Chronicity:  Recurrent Context: eating and at rest   Context: no drug use, no movement, not raising an arm and no trauma   Context comment:  Recent waxing and waning exertional chest pain, new chest pain at rest.   Relieved by:  Nitroglycerin, aspirin and rest Worsened by:  Exertion Ineffective treatments:  Rest Associated symptoms: diaphoresis, nausea, near-syncope and shortness of breath   Associated symptoms: no abdominal pain, no altered mental status, no anxiety, no back pain, no cough, no fever, no headache, no lower extremity edema, no numbness, no palpitations, no syncope, not vomiting and no weakness   Risk factors: coronary artery disease, high cholesterol, hypertension and male sex   Risk factors: no diabetes mellitus, not obese, no prior DVT/PE and no surgery     Past Medical History  Diagnosis Date  . HTN (hypertension)   . Anxiety   . Cardiomyopathy   . Renal insufficiency   . Myocardial infarction ([place])   . CVA (cerebral infarction)   . Chest pain   . Shortness of breath   . Headache(784.0)   . Arthritis   . Blindness    Past Surgical History  Procedure Laterality Date  . Tee without cardioversion  10/09/2011    Procedure: TRANSESOPHAGEAL ECHOCARDIOGRAM (TEE);  Surgeon: Lelon Perla, MD;   Location: [facility] ENDOSCOPY;  Service: Cardiovascular;  Laterality: N/A;  . Right hand    . Insertion of dialysis catheter  09/21/2012    Procedure: INSERTION OF DIALYSIS CATHETER;  Surgeon: Angelia Mould, MD;  Location: [place];  Service: Vascular;  Laterality: N/A;  Right Internal Jugular Placement  . Left heart catheterization with coronary angiogram N/A 09/28/2012    Procedure: LEFT HEART CATHETERIZATION WITH CORONARY ANGIOGRAM;  Surgeon: Sherren Mocha, MD;  Location: [facility] CATH LAB;  Service: Cardiovascular;  Laterality: N/A;  . Left heart catheterization with coronary angiogram N/A 07/07/2014    Procedure: LEFT HEART CATHETERIZATION WITH CORONARY ANGIOGRAM;  Surgeon: Leonie Man, MD;  Location: [facility] CATH LAB;  Service: Cardiovascular;  Laterality: N/A;   Family History  Problem Relation Age of Onset  . Heart attack Mother     MI in her 13s  . Diabetes Mother   . Anesthesia problems Neg Hx   . Hypotension Neg Hx   . Malignant hyperthermia Neg Hx   . Pseudochol deficiency Neg Hx   . Alcohol abuse Father    Social History  Substance Use Topics  . Smoking status: Current Some Day Smoker -- 0.50 packs/day for 50 years    Types: Cigarettes  . Smokeless tobacco: Never Used  . Alcohol Use: No     Comment: Occasional    Review of Systems  Constitutional: Positive for diaphoresis, activity change and appetite change. Negative for fever.  HENT: Negative for congestion, rhinorrhea and sinus  pressure.   Respiratory: Positive for chest tightness and shortness of breath. Negative for cough and wheezing.   Cardiovascular: Positive for chest pain and near-syncope. Negative for palpitations, leg swelling and syncope.  Gastrointestinal: Positive for nausea. Negative for vomiting, abdominal pain, diarrhea, blood in stool and abdominal distention.  Musculoskeletal: Negative for back pain and neck pain.  Skin: Negative for rash and wound.  Neurological: Positive for light-headedness. Negative for  syncope, weakness, numbness and headaches.  All other systems reviewed and are negative.     Allergies  Review of patient's allergies indicates no known allergies.  Home Medications   Prior to Admission medications   Medication Sig Start Date End Date Taking? Authorizing Provider  aspirin 81 MG chewable tablet Chew 1 tablet (81 mg total) by mouth daily. 08/18/15   Geradine Girt, DO  atorvastatin (LIPITOR) 40 MG tablet Take 1 tablet (40 mg total) by mouth daily at 6 PM. 08/18/15   Geradine Girt, DO  B Complex-C-Folic Acid (DIALYVITE TABLET) TABS Take 1 tablet by mouth daily. 09/28/12   Sorin June Leap, MD  calcium acetate (PHOSLO) 667 MG capsule Take 667 mg by mouth 3 (three) times daily with meals.    Historical Provider, MD  carvedilol (COREG) 6.25 MG tablet Take 6.25 mg by mouth 2 (two) times daily with a meal.    Historical Provider, MD  cinacalcet (SENSIPAR) 60 MG tablet Take 60 mg by mouth daily.    Historical Provider, MD  cloNIDine (CATAPRES) 0.1 MG tablet Take 1 tablet (0.1 mg total) by mouth at bedtime. 01/03/16   Nita Sells, MD  clopidogrel (PLAVIX) 75 MG tablet Take 1 tablet (75 mg total) by mouth daily. 04/05/15   Barton Dubois, MD  gabapentin (NEURONTIN) 100 MG capsule Take 1 capsule (100 mg total) by mouth 3 (three) times daily. 04/05/15   Barton Dubois, MD  hydrALAZINE (APRESOLINE) 50 MG tablet Take 1 tablet (50 mg total) by mouth 2 (two) times daily. 01/03/16   Nita Sells, MD  midodrine (PROAMATINE) 5 MG tablet Take 1 tablet (5 mg total) by mouth 3 (three) times daily with meals. Patient not taking: Reported on 02/06/2016 01/03/16   Nita Sells, MD  omega-3 acid ethyl esters (LOVAZA) 1 G capsule Take 1 capsule (1 g total) by mouth 2 (two) times daily. 04/05/15   Barton Dubois, MD  pantoprazole (PROTONIX) 40 MG tablet Take 1 tablet (40 mg total) by mouth daily. 08/18/15   Geradine Girt, DO  tamsulosin (FLOMAX) 0.4 MG CAPS capsule Take 0.4 mg by mouth 2 (two) times  daily.    Historical Provider, MD   BP 185/84 mmHg  Pulse 83  Resp 19  SpO2 98% Physical Exam  Constitutional: He is oriented to person, place, and time. He appears well-developed and well-nourished. No distress.  HENT:  Head: Normocephalic and atraumatic.  Mouth/Throat: Oropharynx is clear and moist.  Eyes: Conjunctivae and EOM are normal. Pupils are equal, round, and reactive to light.  Neck: Normal range of motion. Neck supple.  Cardiovascular: Normal rate, regular rhythm, normal heart sounds and intact distal pulses.   Pulmonary/Chest: Effort normal and breath sounds normal. He exhibits no tenderness.  Abdominal: Soft. He exhibits no distension. There is no tenderness.  Musculoskeletal: He exhibits no edema or tenderness.  Neurological: He is alert and oriented to person, place, and time. No cranial nerve deficit. Coordination normal.  Skin: Skin is warm and dry. No rash noted. He is not diaphoretic.  Nursing note and  vitals reviewed.   ED Course  Procedures (including critical care time) Labs Review Labs Reviewed  BASIC METABOLIC PANEL - Abnormal; Notable for the following:    CO2 21 (*)    BUN 29 (*)    Creatinine, Ser 6.56 (*)    Calcium 8.7 (*)    GFR calc non Af Amer 8 (*)    GFR calc Af Amer 9 (*)    All other components within normal limits  CBC - Abnormal; Notable for the following:    RBC 3.39 (*)    Hemoglobin 10.0 (*)    HCT 29.1 (*)    Platelets 136 (*)    All other components within normal limits  I-STAT TROPOININ, ED    Imaging Review Dg Chest 2 View  03/08/2016  CLINICAL DATA:  Shortness of breath and chest pain EXAM: CHEST  2 VIEW COMPARISON:  01/01/2016 FINDINGS: Normal heart size. No pleural effusion or edema. Aortic atherosclerosis noted. No airspace opacities identified. IMPRESSION: 1. No acute cardiopulmonary abnormalities. 2. Aortic atherosclerosis. Electronically Signed   By: Kerby Moors M.D.   On: 03/08/2016 21:26   I have personally  reviewed and evaluated these images and lab results as part of my medical decision-making.   EKG Interpretation   Date/Time:  Saturday March 08 2016 20:14:58 EDT Ventricular Rate:  85 PR Interval:  158 QRS Duration: 96 QT Interval:  385 QTC Calculation: 458 R Axis:   26 Text Interpretation:  Sinus rhythm LVH with secondary repolarization  abnormality Confirmed by [facility] MD, Corene Cornea [phone]) on 03/08/2016 9:47:32 PM      MDM  70 year-old male with a history of CAD, prior MI, CVA, with last catheterization in 2015 presents to the emergency department noting a waxing and waning history of exertional chest pain the last several weeks which then came on this evening while he was eating dinner, at rest. His pain was pressure-like and severe and similar to his previous MI. EMS was called and gave nitroglycerin 2, aspirin, and he had resolution of his pain while en route. EKG was done and was similar to prior with LVH and secondary repolarization abnormalities with inverted T waves inferior laterally, but no acute ST changes of ischemia. Physical exam unremarkable, as above. EKG was repeated here which showed same, as above. Chest x-ray was done and showed no acute abnormality. Labs are drawn and showed a troponin of 0.04 BMP with clear evidence of ESRD but similar to prior. Given significant history of CAD now with a history concerning for unstable angina the decision was made to admit for further evaluation. Cardiology was consulted in the emergency department and agreed to see the patient at the bedside patient was then admitted to medicine.     Final diagnoses:  Left sided chest pain       Zenovia Jarred, DO 03/08/16 2327  Merrily Pew, MD 03/09/16 [phone]

## 2016-03-08 NOTE — H&P (Signed)
[facility] Admission History and Physical       Trevor [name] [id number] DOB: 12-Aug-1946 DOA: 03/08/2016  Referring physician: EDP PCP: [name], MD  Specialists:   Chief Complaint: Chest Pain  HPI: Trevor [name] is a 70 y.o. male with a history of ESRD on HD ( MWF), CAD, HTN, Atrial Fibrillation, and Previous CVA who presents to the ED with complaints of SSCP rated at a 9/10 that started 45 minutes before arrival to the ED.  The pain was described as sharp and heavy, and was associated with SOB, Dizziness and Nausea.  He denies any Vomiting or Diaphoresis.   He was evalauted in the ED, and referred for further workup.       Review of Systems:    Constitutional: No Weight Loss, No Weight Gain, Night Sweats, Fevers, Chills, Dizziness, Light Headedness, Fatigue, or Generalized Weakness HEENT: No Headaches, Difficulty Swallowing,Tooth/Dental Problems,Sore Throat,  No Sneezing, Rhinitis, Ear Ache, Nasal Congestion, or Post Nasal Drip,  Cardio-vascular:   +Chest pain, Orthopnea, PND, Edema in Lower Extremities, Anasarca, Dizziness, Palpitations  Resp:  +Dyspnea, No DOE, No Productive Cough, No Non-Productive Cough, No Hemoptysis, No Wheezing.    GI: No Heartburn, Indigestion, Abdominal Pain, +Nausea, Vomiting, Diarrhea, Constipation, Hematemesis, Hematochezia, Melena, Change in Bowel Habits,  Loss of Appetite  GU: No Dysuria, No Change in Color of Urine, No Urgency or Urinary Frequency, No Flank pain.  Musculoskeletal: No Joint Pain or Swelling, No Decreased Range of Motion, No Back Pain.  Neurologic: No Syncope, No Seizures, Muscle Weakness, Paresthesia, Vision Disturbance or Loss, No Diplopia, No Vertigo, No Difficulty Walking,  Skin: No Rash or Lesions. Psych: No Change in Mood or Affect, No Depression or Anxiety, No Memory loss, No Confusion, or Hallucinations   Past Medical History  Diagnosis Date  . HTN (hypertension)   . Anxiety   . Cardiomyopathy   .  Renal insufficiency   . Myocardial infarction ([place])   . CVA (cerebral infarction)   . Chest pain   . Shortness of breath   . Headache(784.0)   . Arthritis   . Blindness      Past Surgical History  Procedure Laterality Date  . Tee without cardioversion  10/09/2011    Procedure: TRANSESOPHAGEAL ECHOCARDIOGRAM (TEE);  Surgeon: Lelon Perla, MD;  Location: [facility] ENDOSCOPY;  Service: Cardiovascular;  Laterality: N/A;  . Right hand    . Insertion of dialysis catheter  09/21/2012    Procedure: INSERTION OF DIALYSIS CATHETER;  Surgeon: Angelia Mould, MD;  Location: [place];  Service: Vascular;  Laterality: N/A;  Right Internal Jugular Placement  . Left heart catheterization with coronary angiogram N/A 09/28/2012    Procedure: LEFT HEART CATHETERIZATION WITH CORONARY ANGIOGRAM;  Surgeon: Sherren Mocha, MD;  Location: [facility] CATH LAB;  Service: Cardiovascular;  Laterality: N/A;  . Left heart catheterization with coronary angiogram N/A 07/07/2014    Procedure: LEFT HEART CATHETERIZATION WITH CORONARY ANGIOGRAM;  Surgeon: Leonie Man, MD;  Location: [facility] CATH LAB;  Service: Cardiovascular;  Laterality: N/A;      Prior to Admission medications   Medication Sig Start Date End Date Taking? Authorizing Provider  aspirin 81 MG chewable tablet Chew 1 tablet (81 mg total) by mouth daily. 08/18/15   Geradine Girt, DO  atorvastatin (LIPITOR) 40 MG tablet Take 1 tablet (40 mg total) by mouth daily at 6 PM. 08/18/15   Geradine Girt, DO  B Complex-C-Folic Acid (DIALYVITE TABLET) TABS Take 1 tablet by  mouth daily. 09/28/12   Sorin June Leap, MD  calcium acetate (PHOSLO) 667 MG capsule Take 667 mg by mouth 3 (three) times daily with meals.    Historical Provider, MD  carvedilol (COREG) 6.25 MG tablet Take 6.25 mg by mouth 2 (two) times daily with a meal.    Historical Provider, MD  cinacalcet (SENSIPAR) 60 MG tablet Take 60 mg by mouth daily.    Historical Provider, MD  cloNIDine (CATAPRES) 0.1 MG tablet Take  1 tablet (0.1 mg total) by mouth at bedtime. 01/03/16   Nita Sells, MD  clopidogrel (PLAVIX) 75 MG tablet Take 1 tablet (75 mg total) by mouth daily. 04/05/15   Barton Dubois, MD  gabapentin (NEURONTIN) 100 MG capsule Take 1 capsule (100 mg total) by mouth 3 (three) times daily. 04/05/15   Barton Dubois, MD  hydrALAZINE (APRESOLINE) 50 MG tablet Take 1 tablet (50 mg total) by mouth 2 (two) times daily. 01/03/16   Nita Sells, MD  midodrine (PROAMATINE) 5 MG tablet Take 1 tablet (5 mg total) by mouth 3 (three) times daily with meals. Patient not taking: Reported on 02/06/2016 01/03/16   Nita Sells, MD  omega-3 acid ethyl esters (LOVAZA) 1 G capsule Take 1 capsule (1 g total) by mouth 2 (two) times daily. 04/05/15   Barton Dubois, MD  pantoprazole (PROTONIX) 40 MG tablet Take 1 tablet (40 mg total) by mouth daily. 08/18/15   Geradine Girt, DO  tamsulosin (FLOMAX) 0.4 MG CAPS capsule Take 0.4 mg by mouth 2 (two) times daily.    Historical Provider, MD     No Known Allergies  Social History:  reports that he has been smoking Cigarettes.  He has a 25 pack-year smoking history. He has never used smokeless tobacco. He reports that he does not drink alcohol or use illicit drugs.    Family History  Problem Relation Age of Onset  . Heart attack Mother     MI in her 40s  . Diabetes Mother   . Anesthesia problems Neg Hx   . Hypotension Neg Hx   . Malignant hyperthermia Neg Hx   . Pseudochol deficiency Neg Hx   . Alcohol abuse Father        Physical Exam:  GEN:  Pleasant Elderly Cachectic  70 y.o. African American male examined and in no acute distress; cooperative with exam Filed Vitals:   03/08/16 2030 03/08/16 2045 03/08/16 2100 03/08/16 2215  BP: 161/71 167/77 172/79 184/80  Pulse: 85 86 78 75  Resp: 19 17 19 24   SpO2: 97% 98% 99% 99%   Blood pressure 184/80, pulse 75, resp. rate 24, SpO2 99 %. PSYCH: He is alert and oriented x4; does not appear anxious does not appear  depressed; affect is normal HEENT: Normocephalic and Atraumatic, Mucous membranes pink; PERRLA; EOM intact; Fundi:  Benign;  No scleral icterus, Nares: Patent, Oropharynx: Clear,    Neck:  FROM, No Cervical Lymphadenopathy nor Thyromegaly or Carotid Bruit; No JVD; Breasts:: Not examined CHEST WALL: No tenderness CHEST: Normal respiration, clear to auscultation bilaterally HEART: Regular rate and rhythm; no murmurs rubs or gallops BACK: No kyphosis or scoliosis; No CVA tenderness ABDOMEN: Positive Bowel Sounds, Scaphoid, Soft Non-Tender, No Rebound or Guarding; No Masses, No Organomegaly Rectal Exam: Not done EXTREMITIES: No Cyanosis, Clubbing, or Edema; No Ulcerations. Genitalia: not examined PULSES: 2+ and symmetric SKIN: Normal hydration no rash or ulceration CNS:  Alert and Oriented x 4, No Focal Deficits Vascular: pulses palpable throughout  Labs on Admission:  Basic Metabolic Panel:  Recent Labs Lab 03/08/16 2111  NA 139  K 3.7  CL 106  CO2 21*  GLUCOSE 84  BUN 29*  CREATININE 6.56*  CALCIUM 8.7*   Liver Function Tests: No results for input(s): AST, ALT, ALKPHOS, BILITOT, PROT, ALBUMIN in the last 168 hours. No results for input(s): LIPASE, AMYLASE in the last 168 hours. No results for input(s): AMMONIA in the last 168 hours. CBC:  Recent Labs Lab 03/08/16 2111  WBC 4.7  HGB 10.0*  HCT 29.1*  MCV 85.8  PLT 136*   Cardiac Enzymes: No results for input(s): CKTOTAL, CKMB, CKMBINDEX, TROPONINI in the last 168 hours.  BNP (last 3 results) No results for input(s): BNP in the last 8760 hours.  ProBNP (last 3 results) No results for input(s): PROBNP in the last 8760 hours.  CBG: No results for input(s): GLUCAP in the last 168 hours.  Radiological Exams on Admission: Dg Chest 2 View  03/08/2016  CLINICAL DATA:  Shortness of breath and chest pain EXAM: CHEST  2 VIEW COMPARISON:  01/01/2016 FINDINGS: Normal heart size. No pleural effusion or edema. Aortic  atherosclerosis noted. No airspace opacities identified. IMPRESSION: 1. No acute cardiopulmonary abnormalities. 2. Aortic atherosclerosis. Electronically Signed   By: Kerby Moors M.D.   On: 03/08/2016 21:26     EKG: Independently reviewed. Normal Sinus Rhythm rate = 85, +LVH Changes    Assessment/Plan:      70 y.o. male with  Principal Problem:    Chest pain   Cardiac Monitoring   Cycle Troponins   Cards Consulted   Check Fasting LipIds in AM   2D ECHO in AM   Active Problems:    CAD (coronary artery disease)   On Plavix, ASA,and Carvedilol and Atorvastatin Rx      Chronic combined systolic and diastolic CHF (congestive heart failure) (HCC)   Dialysis PRN   Monitor I/Os      ESRD on hemodialysis ([place])   Notify Dialysis Team to keep Dialysis Schedule   Continue Cinacalcet, and Phoslo      Essential hypertension   Continue Carvedilol Rx   Monitor BPs      A-fib (HCC)   Cardiac Monitoring      Anemia of renal disease   Monitor   Continue Renal Vitamin      Protein-calorie malnutrition, severe (HCC)   Nutrition Consult for Caloric Needs     Tobacco use disorder   Nicotine Patch daily      DVT Prophylaxis   SQ Heparin      Code Status:     FULL CODE      Family Communication:   No Family Present    Disposition Plan:   Observation Status        Time spent:  23 Minutes      Regnald Bowens C [facility] Pager [phone]   If 7AM -7PM Please Contact the Day Rounding Team MD for [facility]  If 7PM-7AM, Please Contact Night-Floor Coverage  [email or web address] Password [email or web address] 03/08/2016, 10:59 PM     ADDENDUM:   Patient was seen and examined on 03/08/2016

## 2016-03-08 NOTE — ED Notes (Signed)
Admitting at bedside 

## 2016-03-09 ENCOUNTER — Encounter (HOSPITAL_COMMUNITY): Payer: Self-pay | Admitting: Nephrology

## 2016-03-09 DIAGNOSIS — I5042 Chronic combined systolic (congestive) and diastolic (congestive) heart failure: Secondary | ICD-10-CM | POA: Diagnosis not present

## 2016-03-09 DIAGNOSIS — N186 End stage renal disease: Secondary | ICD-10-CM | POA: Diagnosis not present

## 2016-03-09 DIAGNOSIS — D631 Anemia in chronic kidney disease: Secondary | ICD-10-CM | POA: Diagnosis not present

## 2016-03-09 DIAGNOSIS — I10 Essential (primary) hypertension: Secondary | ICD-10-CM

## 2016-03-09 DIAGNOSIS — I25118 Atherosclerotic heart disease of native coronary artery with other forms of angina pectoris: Secondary | ICD-10-CM | POA: Diagnosis not present

## 2016-03-09 DIAGNOSIS — F17200 Nicotine dependence, unspecified, uncomplicated: Secondary | ICD-10-CM

## 2016-03-09 DIAGNOSIS — Z992 Dependence on renal dialysis: Secondary | ICD-10-CM

## 2016-03-09 DIAGNOSIS — R079 Chest pain, unspecified: Secondary | ICD-10-CM | POA: Diagnosis not present

## 2016-03-09 LAB — TROPONIN I
TROPONIN I: 0.16 ng/mL — AB (ref <0.031)
Troponin I: 0.19 ng/mL — ABNORMAL HIGH (ref <0.031)
Troponin I: 0.16 ng/mL — ABNORMAL HIGH (ref <0.031)

## 2016-03-09 LAB — BASIC METABOLIC PANEL
Anion gap: 13 (ref 5–15)
BUN: 35 mg/dL — AB (ref 6–20)
CALCIUM: 9.6 mg/dL (ref 8.9–10.3)
CO2: 24 mmol/L (ref 22–32)
CREATININE: 7.9 mg/dL — AB (ref 0.61–1.24)
Chloride: 107 mmol/L (ref 101–111)
GFR calc non Af Amer: 6 mL/min — ABNORMAL LOW (ref >60)
GFR, EST AFRICAN AMERICAN: 7 mL/min — AB (ref >60)
Glucose, Bld: 76 mg/dL (ref 65–99)
Potassium: 4.1 mmol/L (ref 3.5–5.1)
Sodium: 144 mmol/L (ref 135–145)

## 2016-03-09 LAB — CBC
HCT: 30.3 % — ABNORMAL LOW (ref 39.0–52.0)
Hemoglobin: 9.8 g/dL — ABNORMAL LOW (ref 13.0–17.0)
MCH: 27.9 pg (ref 26.0–34.0)
MCHC: 32.3 g/dL (ref 30.0–36.0)
MCV: 86.3 fL (ref 78.0–100.0)
PLATELETS: 143 10*3/uL — AB (ref 150–400)
RBC: 3.51 MIL/uL — AB (ref 4.22–5.81)
RDW: 13.4 % (ref 11.5–15.5)
WBC: 4.6 10*3/uL (ref 4.0–10.5)

## 2016-03-09 MED ORDER — TAMSULOSIN HCL 0.4 MG PO CAPS
0.4000 mg | ORAL_CAPSULE | Freq: Two times a day (BID) | ORAL | Status: DC
  Administered 2016-03-09 – 2016-03-10 (×4): 0.4 mg via ORAL
  Filled 2016-03-09 (×4): qty 1

## 2016-03-09 MED ORDER — ISOSORBIDE MONONITRATE ER 60 MG PO TB24
60.0000 mg | ORAL_TABLET | Freq: Every day | ORAL | Status: DC
  Administered 2016-03-09 – 2016-03-11 (×3): 60 mg via ORAL
  Filled 2016-03-09 (×3): qty 1

## 2016-03-09 MED ORDER — CALCIUM ACETATE (PHOS BINDER) 667 MG PO CAPS
667.0000 mg | ORAL_CAPSULE | Freq: Three times a day (TID) | ORAL | Status: DC
  Administered 2016-03-09 – 2016-03-12 (×8): 667 mg via ORAL
  Filled 2016-03-09 (×10): qty 1

## 2016-03-09 MED ORDER — SODIUM CHLORIDE 0.9% FLUSH
3.0000 mL | Freq: Two times a day (BID) | INTRAVENOUS | Status: DC
  Administered 2016-03-09 – 2016-03-12 (×8): 3 mL via INTRAVENOUS

## 2016-03-09 MED ORDER — RENA-VITE PO TABS
1.0000 | ORAL_TABLET | Freq: Every day | ORAL | Status: DC
Start: 2016-03-09 — End: 2016-03-10
  Administered 2016-03-09 – 2016-03-10 (×2): 1 via ORAL
  Filled 2016-03-09 (×2): qty 1

## 2016-03-09 MED ORDER — HEPARIN SODIUM (PORCINE) 5000 UNIT/ML IJ SOLN
5000.0000 [IU] | Freq: Three times a day (TID) | INTRAMUSCULAR | Status: DC
  Administered 2016-03-09 – 2016-03-12 (×10): 5000 [IU] via SUBCUTANEOUS
  Filled 2016-03-09 (×10): qty 1

## 2016-03-09 MED ORDER — ONDANSETRON HCL 4 MG/2ML IJ SOLN
4.0000 mg | Freq: Four times a day (QID) | INTRAMUSCULAR | Status: DC | PRN
  Administered 2016-03-12: 4 mg via INTRAVENOUS
  Filled 2016-03-09: qty 2

## 2016-03-09 MED ORDER — ONDANSETRON HCL 4 MG PO TABS: 4.0000 mg | ORAL_TABLET | Freq: Four times a day (QID) | ORAL | Status: DC | PRN

## 2016-03-09 MED ORDER — OMEGA-3-ACID ETHYL ESTERS 1 G PO CAPS
1.0000 g | ORAL_CAPSULE | Freq: Two times a day (BID) | ORAL | Status: DC
Start: 2016-03-09 — End: 2016-03-10
  Administered 2016-03-09 (×2): 1 g via ORAL
  Filled 2016-03-09 (×2): qty 1

## 2016-03-09 MED ORDER — SODIUM CHLORIDE 0.9% FLUSH: 3.0000 mL | INTRAVENOUS | Status: DC | PRN

## 2016-03-09 MED ORDER — SODIUM CHLORIDE 0.9 % IV SOLN: 250.0000 mL | INTRAVENOUS | Status: DC | PRN

## 2016-03-09 MED ORDER — GABAPENTIN 100 MG PO CAPS
100.0000 mg | ORAL_CAPSULE | Freq: Three times a day (TID) | ORAL | Status: DC
  Administered 2016-03-09 – 2016-03-12 (×9): 100 mg via ORAL
  Filled 2016-03-09 (×9): qty 1

## 2016-03-09 MED ORDER — HYDROMORPHONE HCL 1 MG/ML IJ SOLN: 0.5000 mg | INTRAMUSCULAR | Status: DC | PRN

## 2016-03-09 MED ORDER — HYDRALAZINE HCL 20 MG/ML IJ SOLN
10.0000 mg | Freq: Four times a day (QID) | INTRAMUSCULAR | Status: DC | PRN
  Administered 2016-03-09: 10 mg via INTRAVENOUS
  Filled 2016-03-09: qty 1

## 2016-03-09 MED ORDER — PANTOPRAZOLE SODIUM 40 MG PO TBEC
40.0000 mg | DELAYED_RELEASE_TABLET | Freq: Every day | ORAL | Status: DC
  Administered 2016-03-09 – 2016-03-12 (×4): 40 mg via ORAL
  Filled 2016-03-09 (×4): qty 1

## 2016-03-09 MED ORDER — CLOPIDOGREL BISULFATE 75 MG PO TABS
75.0000 mg | ORAL_TABLET | Freq: Every day | ORAL | Status: DC
  Administered 2016-03-09 – 2016-03-12 (×4): 75 mg via ORAL
  Filled 2016-03-09 (×4): qty 1

## 2016-03-09 MED ORDER — NITROGLYCERIN 2 % TD OINT
1.0000 [in_us] | TOPICAL_OINTMENT | Freq: Four times a day (QID) | TRANSDERMAL | Status: DC
  Administered 2016-03-09 (×2): 1 [in_us] via TOPICAL
  Filled 2016-03-09: qty 30

## 2016-03-09 MED ORDER — OXYCODONE HCL 5 MG PO TABS
5.0000 mg | ORAL_TABLET | ORAL | Status: DC | PRN
Start: 2016-03-09 — End: 2016-03-12
  Administered 2016-03-10 – 2016-03-12 (×4): 5 mg via ORAL
  Filled 2016-03-09 (×4): qty 1

## 2016-03-09 MED ORDER — ATORVASTATIN CALCIUM 40 MG PO TABS
40.0000 mg | ORAL_TABLET | Freq: Every day | ORAL | Status: DC
  Administered 2016-03-09 – 2016-03-11 (×3): 40 mg via ORAL
  Filled 2016-03-09 (×3): qty 1

## 2016-03-09 MED ORDER — HYDRALAZINE HCL 50 MG PO TABS
50.0000 mg | ORAL_TABLET | Freq: Three times a day (TID) | ORAL | Status: DC
  Administered 2016-03-09 – 2016-03-10 (×4): 50 mg via ORAL
  Filled 2016-03-09 (×4): qty 1

## 2016-03-09 MED ORDER — ACETAMINOPHEN 650 MG RE SUPP: 650.0000 mg | Freq: Four times a day (QID) | RECTAL | Status: DC | PRN

## 2016-03-09 MED ORDER — CLONIDINE HCL 0.1 MG PO TABS
0.1000 mg | ORAL_TABLET | Freq: Two times a day (BID) | ORAL | Status: DC
  Administered 2016-03-09 (×2): 0.1 mg via ORAL
  Filled 2016-03-09 (×2): qty 1

## 2016-03-09 MED ORDER — ASPIRIN EC 325 MG PO TBEC
325.0000 mg | DELAYED_RELEASE_TABLET | Freq: Every day | ORAL | Status: DC
  Administered 2016-03-09 – 2016-03-12 (×4): 325 mg via ORAL
  Filled 2016-03-09 (×4): qty 1

## 2016-03-09 MED ORDER — CINACALCET HCL 30 MG PO TABS
60.0000 mg | ORAL_TABLET | Freq: Every day | ORAL | Status: DC
  Administered 2016-03-09 – 2016-03-12 (×4): 60 mg via ORAL
  Filled 2016-03-09 (×5): qty 2

## 2016-03-09 MED ORDER — DOXERCALCIFEROL 4 MCG/2ML IV SOLN
3.0000 ug | INTRAVENOUS | Status: DC
  Administered 2016-03-10: 3 ug via INTRAVENOUS
  Filled 2016-03-09 (×2): qty 2

## 2016-03-09 MED ORDER — ACETAMINOPHEN 325 MG PO TABS: 650.0000 mg | ORAL_TABLET | Freq: Four times a day (QID) | ORAL | Status: DC | PRN

## 2016-03-09 NOTE — Progress Notes (Signed)
PROGRESS NOTE  Trevor [name] [id number] DOB: [birth year] DOA: 03/08/2016 PCP: [name], MD  Assessment/Plan: Chest pain -tele -no plans for intervention currently -BP control  CAD (coronary artery disease) -On Plavix, ASA,and Carvedilol and Atorvastatin Rx  Chronic combined systolic and diastolic CHF (congestive heart failure) ([place]) Dialysis due in AM Last done on Friday  ESRD on hemodialysis [facility]) -routine consult placed -Continue Cinacalcet, and Phoslo  Essential hypertension -resume home meds and adjust accordingly -BP meds recently changed for orthostatic hypotension (2/17)  A-fib ([place]) -Cardiac Monitoring  Anemia of renal disease  Protein-calorie malnutrition, severe ([place]) Nutrition Consult for Caloric Needs  Tobacco use disorder -encouraged cessation  Code Status: full Family Communication: patient Disposition Plan:   Consultants:  Cards  renal  Procedures:      HPI/Subjective: Has not used cocaine since Sept of 2014  Objective: Filed Vitals:   03/09/16 0416 03/09/16 0835  BP: 145/66 168/71  Pulse:  67  Temp: 98 F (36.7 C) 98.4 F (36.9 C)  Resp: 16 18    Intake/Output Summary (Last 24 hours) at 03/09/16 1124 Last data filed at 03/09/16 0835  Gross per 24 hour  Intake      0 ml  Output      0 ml  Net      0 ml   Filed Weights   03/09/16 0015  Weight: 60.51 kg (133 lb 6.4 oz)    Exam:   General:  Awake, NAD  Cardiovascular: rrr  Respiratory: clear  Abdomen: +BS, soft  Musculoskeletal: no edema   Data Reviewed: Basic Metabolic Panel:  Recent Labs Lab 03/08/16 2111 03/09/16 0547  NA 139 144  K 3.7 4.1  CL 106 107  CO2 21* 24  GLUCOSE 84 76  BUN 29* 35*  CREATININE 6.56* 7.90*  CALCIUM 8.7* 9.6   Liver Function Tests: No results for input(s): AST, ALT, ALKPHOS, BILITOT, PROT, ALBUMIN in the last 168 hours. No results for input(s): LIPASE, AMYLASE in the last 168 hours. No  results for input(s): AMMONIA in the last 168 hours. CBC:  Recent Labs Lab 03/08/16 2111 03/09/16 0547  WBC 4.7 4.6  HGB 10.0* 9.8*  HCT 29.1* 30.3*  MCV 85.8 86.3  PLT 136* 143*   Cardiac Enzymes:  Recent Labs Lab 03/09/16 0328 03/09/16 0547  TROPONINI 0.16* 0.19*   BNP (last 3 results) No results for input(s): BNP in the last 8760 hours.  ProBNP (last 3 results) No results for input(s): PROBNP in the last 8760 hours.  CBG: No results for input(s): GLUCAP in the last 168 hours.  No results found for this or any previous visit (from the past 240 hour(s)).   Studies: Dg Chest 2 View  03/08/2016  CLINICAL DATA:  Shortness of breath and chest pain EXAM: CHEST  2 VIEW COMPARISON:  01/01/2016 FINDINGS: Normal heart size. No pleural effusion or edema. Aortic atherosclerosis noted. No airspace opacities identified. IMPRESSION: 1. No acute cardiopulmonary abnormalities. 2. Aortic atherosclerosis. Electronically Signed   By: Kerby Moors M.D.   On: 03/08/2016 21:26    Scheduled Meds: . aspirin EC  325 mg Oral Daily  . atorvastatin  40 mg Oral q1800  . calcium acetate  667 mg Oral TID WC  . carvedilol  6.25 mg Oral BID WC  . cinacalcet  60 mg Oral Q breakfast  . cloNIDine  0.1 mg Oral BID  . clopidogrel  75 mg Oral Daily  . gabapentin  100 mg Oral TID  .  heparin  5,000 Units Subcutaneous 3 times per day  . hydrALAZINE  50 mg Oral 3 times per day  . isosorbide mononitrate  60 mg Oral Daily  . multivitamin  1 tablet Oral Daily  . nitroGLYCERIN  1 inch Topical 4 times per day  . omega-3 acid ethyl esters  1 g Oral BID  . pantoprazole  40 mg Oral Daily  . sodium chloride flush  3 mL Intravenous Q12H  . tamsulosin  0.4 mg Oral BID   Continuous Infusions:  Antibiotics Given (last 72 hours)    None      Principal Problem:   Chest pain Active Problems:   Tobacco use disorder   A-fib (HCC)   Chronic combined systolic and diastolic CHF (congestive heart failure)  (HCC)   ESRD on hemodialysis (HCC)   Anemia of renal disease   Protein-calorie malnutrition, severe (HCC)   CAD (coronary artery disease)   Essential hypertension    Time spent: 25 min    [facility] Pager [phone]. If 7PM-7AM, please contact night-coverage at [email or web address], password [facility] 03/09/2016, 11:24 AM

## 2016-03-09 NOTE — Consult Note (Signed)
Renal Service Consult Note [place] 03/09/2016 Trevor [name] D Requesting Physician:  Dr. Eliseo Squires  Reason for Consult:  ESRD pt with  HPI: The patient is a 70 y.o. year-old with hist of HTN, systolic CHF Q000111Q, and ESRD on HD MWF presented to ED last night with chest pain. Has had prior heart cath in 2015 which showed 50-70% LCX and 60% diagonal. BP was uncontrolled on admission, history of long-term HTN hard to control. Admitted, troponins slightly up 0.16, 0.19, 0.16.  Cardiology has seen and suspects no ischemia, pt w chronic CP/ LVH and mild but flat trop elevation.  No ischemic w/u planned. Asked to see for dialysis.   Patient denies any recent HD related issues.  No active CP now, no leg swelling/ fever/ prod cough . No n/v/d.      ROS  no joint pain   no HA  no rash  no diarrhea  no nausea/ vomiting  no dysuria  no difficulty voiding  no change in urine color    Past Medical History  Past Medical History  Diagnosis Date  . HTN (hypertension)   . Anxiety   . Cardiomyopathy   . Renal insufficiency   . Myocardial infarction ([place])   . CVA (cerebral infarction)   . Chest pain   . Shortness of breath   . Headache(784.0)   . Arthritis   . Blindness    Past Surgical History  Past Surgical History  Procedure Laterality Date  . Tee without cardioversion  10/09/2011    Procedure: TRANSESOPHAGEAL ECHOCARDIOGRAM (TEE);  Surgeon: Lelon Perla, MD;  Location: [facility] ENDOSCOPY;  Service: Cardiovascular;  Laterality: N/A;  . Right hand    . Insertion of dialysis catheter  09/21/2012    Procedure: INSERTION OF DIALYSIS CATHETER;  Surgeon: Angelia Mould, MD;  Location: [place];  Service: Vascular;  Laterality: N/A;  Right Internal Jugular Placement  . Left heart catheterization with coronary angiogram N/A 09/28/2012    Procedure: LEFT HEART CATHETERIZATION WITH CORONARY ANGIOGRAM;  Surgeon: Sherren Mocha, MD;  Location: [facility] CATH LAB;   Service: Cardiovascular;  Laterality: N/A;  . Left heart catheterization with coronary angiogram N/A 07/07/2014    Procedure: LEFT HEART CATHETERIZATION WITH CORONARY ANGIOGRAM;  Surgeon: Leonie Man, MD;  Location: [facility] CATH LAB;  Service: Cardiovascular;  Laterality: N/A;   Family History  Family History  Problem Relation Age of Onset  . Heart attack Mother     MI in her 16s  . Diabetes Mother   . Anesthesia problems Neg Hx   . Hypotension Neg Hx   . Malignant hyperthermia Neg Hx   . Pseudochol deficiency Neg Hx   . Alcohol abuse Father    Social History  reports that he has been smoking Cigarettes.  He has a 25 pack-year smoking history. He has never used smokeless tobacco. He reports that he does not drink alcohol or use illicit drugs. Allergies No Known Allergies Home medications Prior to Admission medications   Medication Sig Start Date End Date Taking? Authorizing Provider  aspirin 81 MG chewable tablet Chew 1 tablet (81 mg total) by mouth daily. 08/18/15   Geradine Girt, DO  atorvastatin (LIPITOR) 40 MG tablet Take 1 tablet (40 mg total) by mouth daily at 6 PM. 08/18/15   Geradine Girt, DO  B Complex-C-Folic Acid (DIALYVITE TABLET) TABS Take 1 tablet by mouth daily. 09/28/12   Sorin June Leap, MD  calcium acetate (PHOSLO) 667 MG  capsule Take 667 mg by mouth 3 (three) times daily with meals.    Historical Provider, MD  carvedilol (COREG) 6.25 MG tablet Take 6.25 mg by mouth 2 (two) times daily with a meal.    Historical Provider, MD  cinacalcet (SENSIPAR) 60 MG tablet Take 60 mg by mouth daily.    Historical Provider, MD  cloNIDine (CATAPRES) 0.1 MG tablet Take 1 tablet (0.1 mg total) by mouth at bedtime. 01/03/16   Nita Sells, MD  clopidogrel (PLAVIX) 75 MG tablet Take 1 tablet (75 mg total) by mouth daily. 04/05/15   Barton Dubois, MD  gabapentin (NEURONTIN) 100 MG capsule Take 1 capsule (100 mg total) by mouth 3 (three) times daily. 04/05/15   Barton Dubois, MD  hydrALAZINE  (APRESOLINE) 50 MG tablet Take 1 tablet (50 mg total) by mouth 2 (two) times daily. 01/03/16   Nita Sells, MD  midodrine (PROAMATINE) 5 MG tablet Take 1 tablet (5 mg total) by mouth 3 (three) times daily with meals. Patient not taking: Reported on 02/06/2016 01/03/16   Nita Sells, MD  omega-3 acid ethyl esters (LOVAZA) 1 G capsule Take 1 capsule (1 g total) by mouth 2 (two) times daily. 04/05/15   Barton Dubois, MD  pantoprazole (PROTONIX) 40 MG tablet Take 1 tablet (40 mg total) by mouth daily. 08/18/15   Geradine Girt, DO  tamsulosin (FLOMAX) 0.4 MG CAPS capsule Take 0.4 mg by mouth 2 (two) times daily.    Historical Provider, MD   Liver Function Tests No results for input(s): AST, ALT, ALKPHOS, BILITOT, PROT, ALBUMIN in the last 168 hours. No results for input(s): LIPASE, AMYLASE in the last 168 hours. CBC  Recent Labs Lab 03/08/16 2111 03/09/16 0547  WBC 4.7 4.6  HGB 10.0* 9.8*  HCT 29.1* 30.3*  MCV 85.8 86.3  PLT 136* [id number]*   Basic Metabolic Panel  Recent Labs Lab 03/08/16 2111 03/09/16 0547  NA 139 144  K 3.7 4.1  CL 106 107  CO2 21* 24  GLUCOSE 84 76  BUN 29* 35*  CREATININE 6.56* 7.90*  CALCIUM 8.7* 9.6    Filed Vitals:   03/09/16 0416 03/09/16 0835 03/09/16 1409 03/09/16 1755  BP: 145/66 168/71 137/65 136/82  Pulse:  67 80 84  Temp: 98 F (36.7 C) 98.4 F (36.9 C) 98.2 F (36.8 C)   TempSrc: Oral Oral Oral   Resp: 16 18 20    Height:      Weight:      SpO2: 100% 98% 100%    Exam Pleasant, calm, no distress No rash, cyanosis or gangrene Sclera anicteric, throat clear  No jvd Chest clear bilat no rales/ wheezes RRR no MRG Abd soft ntnd no mass or ascites +bs GU normal male MS no joint effusions or deformity Ext no LE or UE edema / no wounds or ulcers Neuro is alert, Ox 3 , nf LUA AVF +bruit    Dialysis: [facility]   4h   2/2.5 bath  Profile4  62kg  LUA AVF  Hep 1900 11.4/ 40%/ 1225 > no esa or Fe 9.7/ 6.8/ 501 > hect 3 ug, sensipar  60/d, phoslo 1 ac Compliance -very good. BP's gen high. Gets to dry wt.  Assessment: 1. Chest pain - noncardiac 2. ESRD HD MWF 3. Volume under dry wt 4. Blind d/t prior CVA 5. HTN long hx difficult to control, on coreg/ clon/ hydral 6. Anemia of CKD - Hb 9.8, no esa for now 7. MBD of CKD -  cont D/ binder/ sensipar   Plan - HD Monday, first shift, UF 2kg as Ronnie Derby MD [employer] pager [phone]    cell [phone] 03/09/2016, 5:56 PM

## 2016-03-09 NOTE — Progress Notes (Signed)
Dr. Lamona Curl paged to make aware that patient's troponin is 0.16.Patient has no c/o pain,shob, nausea or vomiting. [facility] [employer]

## 2016-03-09 NOTE — Progress Notes (Signed)
Patient Name: Trevor [name] Date of Encounter: 03/09/2016  Primary Cardiologist: Dr. Stanford Breed   Principal Problem:   Chest pain Active Problems:   Tobacco use disorder   A-fib (HCC)   Chronic combined systolic and diastolic CHF (congestive heart failure) (HCC)   ESRD on hemodialysis (HCC)   Anemia of renal disease   Protein-calorie malnutrition, severe (HCC)   CAD (coronary artery disease)   Essential hypertension    SUBJECTIVE  Denies any further chest pain or SOB. States he has been compliant with his medication. Also his BP is usually high at home, but never this high   CURRENT MEDS . aspirin EC  325 mg Oral Daily  . atorvastatin  40 mg Oral q1800  . calcium acetate  667 mg Oral TID WC  . carvedilol  6.25 mg Oral BID WC  . cinacalcet  60 mg Oral Q breakfast  . clopidogrel  75 mg Oral Daily  . gabapentin  100 mg Oral TID  . heparin  5,000 Units Subcutaneous 3 times per day  . multivitamin  1 tablet Oral Daily  . nitroGLYCERIN  1 inch Topical 4 times per day  . omega-3 acid ethyl esters  1 g Oral BID  . pantoprazole  40 mg Oral Daily  . sodium chloride flush  3 mL Intravenous Q12H  . tamsulosin  0.4 mg Oral BID    OBJECTIVE  Filed Vitals:   03/09/16 0015 03/09/16 0120 03/09/16 0416 03/09/16 0835  BP: 207/57 158/70 145/66 168/71  Pulse: 85 87  67  Temp: 97.2 F (36.2 C)  98 F (36.7 C) 98.4 F (36.9 C)  TempSrc: Oral  Oral Oral  Resp: 16  16 18   Height: 6' (1.829 m)     Weight: 133 lb 6.4 oz (60.51 kg)     SpO2: 100%  100% 98%    Intake/Output Summary (Last 24 hours) at 03/09/16 0951 Last data filed at 03/09/16 0835  Gross per 24 hour  Intake      0 ml  Output      0 ml  Net      0 ml   Filed Weights   03/09/16 0015  Weight: 133 lb 6.4 oz (60.51 kg)    PHYSICAL EXAM  General: Pleasant, NAD. Neuro: Alert and oriented X 3. Moves all extremities spontaneously. Psych: Normal affect. HEENT:  Normal  Neck: Supple without bruits or JVD. Lungs:   Resp regular and unlabored, CTA. Heart: RRR no s3, s4, or murmurs. Abdomen: Soft, non-tender, non-distended, BS + x 4.  Extremities: No clubbing, cyanosis or edema. DP/PT/Radials 2+ and equal bilaterally.  Accessory Clinical Findings  CBC  Recent Labs  03/08/16 2111 03/09/16 0547  WBC 4.7 4.6  HGB 10.0* 9.8*  HCT 29.1* 30.3*  MCV 85.8 86.3  PLT 136* A999333*   Basic Metabolic Panel  Recent Labs  03/08/16 2111 03/09/16 0547  NA 139 144  K 3.7 4.1  CL 106 107  CO2 21* 24  GLUCOSE 84 76  BUN 29* 35*  CREATININE 6.56* 7.90*  CALCIUM 8.7* 9.6   Cardiac Enzymes  Recent Labs  03/09/16 0328 03/09/16 0547  TROPONINI 0.16* 0.19*    TELE NSR without significant ventricular ectopy    ECG  NSR with TWI in inferolateral leads,   Echocardiogram 12/28/2015  LV EF: 45% - 50%  ------------------------------------------------------------------- Indications: Chest pain 786.51.  ------------------------------------------------------------------- History: PMH: NICM. ESRD> Cocaine use. Bradycardia. NSTEMI. Coronary artery disease. Stroke. Transient ischemic attack. Risk factors:  Current tobacco use. Hypertension.  ------------------------------------------------------------------- Study Conclusions  - Left ventricle: The cavity size was normal. There was moderate  concentric hypertrophy. Systolic function was mildly reduced. The  estimated ejection fraction was in the range of 45% to 50%. Mild  diffuse hypokinesis with no identifiable regional variations.  Doppler parameters are consistent with abnormal left ventricular  relaxation (grade 1 diastolic dysfunction). - Mitral valve: Calcified annulus. - Left atrium: The atrium was mildly dilated.    Cath 07/2014 POST-OPERATIVE DIAGNOSIS:   Moderate to severe single-vessel disease involving the early mid circumflex with a roughly 60% stenosis, not physiologically significant by FFR. Otherwise  nonobstructive CAD throughout  Surprisingly improved LVEF by the ventriculography compared to previous evaluations  Normal LVEDP with borderline systemic hypotension   Radiology/Studies  Dg Chest 2 View  03/08/2016  CLINICAL DATA:  Shortness of breath and chest pain EXAM: CHEST  2 VIEW COMPARISON:  01/01/2016 FINDINGS: Normal heart size. No pleural effusion or edema. Aortic atherosclerosis noted. No airspace opacities identified. IMPRESSION: 1. No acute cardiopulmonary abnormalities. 2. Aortic atherosclerosis. Electronically Signed   By: Kerby Moors M.D.   On: 03/08/2016 21:26    ASSESSMENT AND PLAN  1. Chest pain in the setting of malignant HTN  - per overnight fellow, hold off on further ischemic workup. He says he had 1 episode of chest pain yesterday however resolved, his EKG shows chronic TWI in inferolateral leads unchanged since previous cath   - will need to focus on controlling his BP, not sure why his home clonidine and hydralazine were held, will add hydralazine at 50mg  TID, start Imdur 60mg  daily. Continue coreg daily. Although he has h/o noncompliance, he says he has been complaint with his medication.  - give h/o cocaine abuse, will need urine drug test. Continue coreg, avoid selective BB given h/o cocaine use  2. Malignant HTN  3. Nonobstructive CAD  4. ESRD on HD  5. systolic CHF (Q000111Q)  6. H/o cocaine abuse   Signed, Woodward Ku Pager: [phone]  The patient was seen and examined, and I agree with the physical exam, assessment and plan as documented above which has been discussed with H. Meng PA-C, with modifications as noted below. Pt admitted with exacerbation of chronic chest pain. Has chronic ECG features of LVH with repolarization abnormalities. Troponins mildly elevated but remain flat. I agree with optimal BP control with a realistic aim for SBP 150's in context of ESRD. No ischemic workup is necessary at this time.  Kate Sable, MD,  [facility]  03/09/2016 10:23 AM

## 2016-03-10 DIAGNOSIS — N186 End stage renal disease: Secondary | ICD-10-CM | POA: Diagnosis not present

## 2016-03-10 DIAGNOSIS — F17200 Nicotine dependence, unspecified, uncomplicated: Secondary | ICD-10-CM | POA: Diagnosis not present

## 2016-03-10 DIAGNOSIS — I5042 Chronic combined systolic (congestive) and diastolic (congestive) heart failure: Secondary | ICD-10-CM | POA: Diagnosis not present

## 2016-03-10 DIAGNOSIS — R079 Chest pain, unspecified: Secondary | ICD-10-CM | POA: Diagnosis not present

## 2016-03-10 LAB — RENAL FUNCTION PANEL
Albumin: 2.8 g/dL — ABNORMAL LOW (ref 3.5–5.0)
Anion gap: 13 (ref 5–15)
BUN: 46 mg/dL — ABNORMAL HIGH (ref 6–20)
CHLORIDE: 106 mmol/L (ref 101–111)
CO2: 22 mmol/L (ref 22–32)
CREATININE: 10.21 mg/dL — AB (ref 0.61–1.24)
Calcium: 9 mg/dL (ref 8.9–10.3)
GFR calc non Af Amer: 4 mL/min — ABNORMAL LOW (ref >60)
GFR, EST AFRICAN AMERICAN: 5 mL/min — AB (ref >60)
Glucose, Bld: 79 mg/dL (ref 65–99)
Phosphorus: 4.5 mg/dL (ref 2.5–4.6)
Potassium: 4.5 mmol/L (ref 3.5–5.1)
Sodium: 141 mmol/L (ref 135–145)

## 2016-03-10 MED ORDER — SODIUM CHLORIDE 0.9 % IV SOLN: 100.0000 mL | INTRAVENOUS | Status: DC | PRN

## 2016-03-10 MED ORDER — HEPARIN SODIUM (PORCINE) 1000 UNIT/ML DIALYSIS: 1900.0000 [IU] | Freq: Once | INTRAMUSCULAR | Status: DC

## 2016-03-10 MED ORDER — DARBEPOETIN ALFA 100 MCG/0.5ML IJ SOSY
100.0000 ug | PREFILLED_SYRINGE | INTRAMUSCULAR | Status: DC
  Administered 2016-03-10: 100 ug via INTRAVENOUS
  Filled 2016-03-10: qty 0.5

## 2016-03-10 MED ORDER — LIDOCAINE-PRILOCAINE 2.5-2.5 % EX CREA
1.0000 | TOPICAL_CREAM | CUTANEOUS | Status: DC | PRN
Start: 2016-03-10 — End: 2016-03-10
  Filled 2016-03-10: qty 5

## 2016-03-10 MED ORDER — DOXERCALCIFEROL 4 MCG/2ML IV SOLN
INTRAVENOUS | Status: AC
  Filled 2016-03-10: qty 2

## 2016-03-10 MED ORDER — DARBEPOETIN ALFA 100 MCG/0.5ML IJ SOSY
PREFILLED_SYRINGE | INTRAMUSCULAR | Status: AC
  Filled 2016-03-10: qty 0.5

## 2016-03-10 MED ORDER — RENA-VITE PO TABS
1.0000 | ORAL_TABLET | Freq: Every day | ORAL | Status: DC
  Administered 2016-03-11: 1 via ORAL
  Filled 2016-03-10: qty 1

## 2016-03-10 MED ORDER — LIDOCAINE HCL (PF) 1 % IJ SOLN: 5.0000 mL | INTRAMUSCULAR | Status: DC | PRN

## 2016-03-10 MED ORDER — ALTEPLASE 2 MG IJ SOLR: 2.0000 mg | Freq: Once | INTRAMUSCULAR | Status: DC | PRN

## 2016-03-10 MED ORDER — PENTAFLUOROPROP-TETRAFLUOROETH EX AERO: 1.0000 "application " | INHALATION_SPRAY | CUTANEOUS | Status: DC | PRN

## 2016-03-10 MED ORDER — HEPARIN SODIUM (PORCINE) 1000 UNIT/ML DIALYSIS
1000.0000 [IU] | INTRAMUSCULAR | Status: DC | PRN
  Filled 2016-03-10: qty 1

## 2016-03-10 NOTE — Progress Notes (Signed)
Subjective: Interval History: has no complaint ,no CP.  Objective: Vital signs in last 24 hours: Temp:  [97.5 F (36.4 C)-98.4 F (36.9 C)] 97.5 F (36.4 C) (04/10 0555) Pulse Rate:  [62-84] 62 (04/10 0555) Resp:  [18-20] 18 (04/10 0555) BP: (133-168)/(60-82) 133/60 mmHg (04/10 0555) SpO2:  [98 %-100 %] 100 % (04/10 0555) Weight:  [61.508 kg (135 lb 9.6 oz)] 61.508 kg (135 lb 9.6 oz) (04/10 0555) Weight change: 0.998 kg (2 lb 3.2 oz)  Intake/Output from previous day: 04/09 0701 - 04/10 0700 In: 480 [P.O.:480] Out: -  Intake/Output this shift:    General appearance: alert, cooperative and no distress Resp: clear to auscultation bilaterally Cardio: S1, S2 normal and systolic murmur: holosystolic 2/6, blowing at apex GI: soft, non-tender; bowel sounds normal; no masses,  no organomegaly Extremities: AVF LUA  Lab Results:  Recent Labs  03/08/16 2111 03/09/16 0547  WBC 4.7 4.6  HGB 10.0* 9.8*  HCT 29.1* 30.3*  PLT 136* 143*   BMET:  Recent Labs  03/08/16 2111 03/09/16 0547  NA 139 144  K 3.7 4.1  CL 106 107  CO2 21* 24  GLUCOSE 84 76  BUN 29* 35*  CREATININE 6.56* 7.90*  CALCIUM 8.7* 9.6   No results for input(s): PTH in the last 72 hours. Iron Studies: No results for input(s): IRON, TIBC, TRANSFERRIN, FERRITIN in the last 72 hours.  Studies/Results: Dg Chest 2 View  03/08/2016  CLINICAL DATA:  Shortness of breath and chest pain EXAM: CHEST  2 VIEW COMPARISON:  01/01/2016 FINDINGS: Normal heart size. No pleural effusion or edema. Aortic atherosclerosis noted. No airspace opacities identified. IMPRESSION: 1. No acute cardiopulmonary abnormalities. 2. Aortic atherosclerosis. Electronically Signed   By: Kerby Moors M.D.   On: 03/08/2016 21:26    I have reviewed the patient's current medications.  Assessment/Plan: 1 ESRD for HD.  Lower vol, lower meds 2 HTN, lower vol and meds      Hartlee Amedee L 03/10/2016,7:54 AM

## 2016-03-10 NOTE — Progress Notes (Signed)
Pt called out to nurses station to notify that his hand hurts and he would like to get xray done if possible.    RN then went to pt bedside and asked him what had happened.  Pt states he fell about 3-4 weeks ago and his Right Hand had been very swollen and it would be very painful when he "moved it a certain way".  Pt states his hand is not very swollen now but it continues to feel very sore.    Will notify MD to assess for the need to xray while in hospital.

## 2016-03-10 NOTE — Progress Notes (Signed)
PROGRESS NOTE  Trevor [name] [id number] DOB: 12/27/1945 DOA: 03/08/2016 PCP: [name], MD  Assessment/Plan: Chest pain -tele -no plans for intervention currently -chest pain free -BP control- goal  Around 150 as patient is dilaysis  CAD (coronary artery disease) -On Plavix, ASA,and Carvedilol and Atorvastatin Rx  Chronic combined systolic and diastolic CHF (congestive heart failure) ([place]) s/p dialysis Last done on Friday  ESRD on hemodialysis [facility]) -routine consult placed -Continue Cinacalcet, and Phoslo  Essential hypertension -resume home meds and adjust accordingly -BP meds recently changed for orthostatic hypotension (2/17)  A-fib ([place]) -Cardiac Monitoring  Anemia of renal disease  Protein-calorie malnutrition, severe ([place]) Nutrition Consult for Caloric Needs  Tobacco use disorder -encouraged cessation  Code Status: full Family Communication: patient Disposition Plan: PT eval-- last time in hospital, patient was recommended [name]   Consultants:  Cards  renal  Procedures:      HPI/Subjective: Feeling weak after dialysis Lives alone, has walker and wheelchair  Objective: Filed Vitals:   03/10/16 1200 03/10/16 1205  BP: 95/46 135/67  Pulse: 61 60  Temp:  97.5 F (36.4 C)  Resp: 20 18    Intake/Output Summary (Last 24 hours) at 03/10/16 1333 Last data filed at 03/10/16 1205  Gross per 24 hour  Intake      0 ml  Output   1415 ml  Net  -1415 ml   Filed Weights   03/10/16 0555 03/10/16 0750 03/10/16 1205  Weight: 61.508 kg (135 lb 9.6 oz) 61.7 kg (136 lb 0.4 oz) 60.3 kg (132 lb 15 oz)    Exam:   General:  Awake, NAD  Cardiovascular: rrr  Respiratory: clear  Abdomen: +BS, soft  Musculoskeletal: no edema   Data Reviewed: Basic Metabolic Panel:  Recent Labs Lab 03/08/16 2111 03/09/16 0547 03/10/16 0827  NA 139 144 141  K 3.7 4.1 4.5  CL 106 107 106  CO2 21* 24 22  GLUCOSE 84 76 79  BUN 29* 35*  46*  CREATININE 6.56* 7.90* 10.21*  CALCIUM 8.7* 9.6 9.0  PHOS  --   --  4.5   Liver Function Tests:  Recent Labs Lab 03/10/16 0827  ALBUMIN 2.8*   No results for input(s): LIPASE, AMYLASE in the last 168 hours. No results for input(s): AMMONIA in the last 168 hours. CBC:  Recent Labs Lab 03/08/16 2111 03/09/16 0547  WBC 4.7 4.6  HGB 10.0* 9.8*  HCT 29.1* 30.3*  MCV 85.8 86.3  PLT 136* 143*   Cardiac Enzymes:  Recent Labs Lab 03/09/16 0328 03/09/16 0547 03/09/16 1226  TROPONINI 0.16* 0.19* 0.16*   BNP (last 3 results) No results for input(s): BNP in the last 8760 hours.  ProBNP (last 3 results) No results for input(s): PROBNP in the last 8760 hours.  CBG: No results for input(s): GLUCAP in the last 168 hours.  No results found for this or any previous visit (from the past 240 hour(s)).   Studies: Dg Chest 2 View  03/08/2016  CLINICAL DATA:  Shortness of breath and chest pain EXAM: CHEST  2 VIEW COMPARISON:  01/01/2016 FINDINGS: Normal heart size. No pleural effusion or edema. Aortic atherosclerosis noted. No airspace opacities identified. IMPRESSION: 1. No acute cardiopulmonary abnormalities. 2. Aortic atherosclerosis. Electronically Signed   By: Kerby Moors M.D.   On: 03/08/2016 21:26    Scheduled Meds: . aspirin EC  325 mg Oral Daily  . atorvastatin  40 mg Oral q1800  . calcium acetate  667 mg Oral TID  WC  . carvedilol  6.25 mg Oral BID WC  . cinacalcet  60 mg Oral Q breakfast  . clopidogrel  75 mg Oral Daily  . darbepoetin (ARANESP) injection - DIALYSIS  100 mcg Intravenous Q Mon-HD  . doxercalciferol  3 mcg Intravenous Q M,W,F-HD  . gabapentin  100 mg Oral TID  . heparin  5,000 Units Subcutaneous 3 times per day  . hydrALAZINE  50 mg Oral 3 times per day  . isosorbide mononitrate  60 mg Oral Daily  . multivitamin  1 tablet Oral Daily  . pantoprazole  40 mg Oral Daily  . sodium chloride flush  3 mL Intravenous Q12H  . tamsulosin  0.4 mg Oral BID    Continuous Infusions:  Antibiotics Given (last 72 hours)    None      Principal Problem:   Chest pain Active Problems:   Tobacco use disorder   A-fib (HCC)   Chronic combined systolic and diastolic CHF (congestive heart failure) (HCC)   ESRD on hemodialysis (HCC)   Anemia of renal disease   Protein-calorie malnutrition, severe (HCC)   CAD (coronary artery disease)   Essential hypertension    Time spent: 15 min    [facility] Pager [phone]. If 7PM-7AM, please contact night-coverage at [email or web address], password [facility] 03/10/2016, 1:33 PM

## 2016-03-10 NOTE — Care Management Note (Addendum)
Case Management Note  Patient Details  Name: Trevor [name] MRN: [id number] Date of Birth: 04-24-1946  Subjective/Objective:    Chest pain, CAD                Action/Plan: Discharge Planning: NCM spoke to pt at bedside. Pt states he is active with [facility] for [facility] RN. Had aide that is covered by his VA benefits. She comes 3 hours, five days per week. He has RW, wheelchair and cane at home. States he lives at home alone. Dtr will assist with his care as needed. Contacted [facility] to follow up on [facility]. Resumption of care for [facility] RN needed. Pt states his goes to [facility], and Theora Gianotti is his CSW. Will fax dc summary to [place] once available. Notified [facility] of admission. Pt states his PCP is at the [place], and he has been trying to get an appt since his last hospital admission.     Expected Discharge Date:                  Expected Discharge Plan:  [place]  In-House Referral:  NA  Discharge planning Services  CM Consult  Post Acute Care Choice:  Home Health, Resumption of Svcs/PTA Provider Choice offered to:  Patient  DME Arranged:  N/A DME Agency:  NA  HH Arranged:  RN, PT, OT HH Agency:  [facility]  Status of Service:  Completed, signed off  Medicare Important Message Given:    Date Medicare IM Given:    Medicare IM give by:    Date Additional Medicare IM Given:    Additional Medicare Important Message give by:     If discussed at [place] of Stay Meetings, dates discussed:    Additional Comments:  Erenest Rasher, RN 03/10/2016, 4:32 PM

## 2016-03-10 NOTE — Care Management Obs Status (Signed)
[place] NOTIFICATION   Patient Details  Name: BRIAR STPIERRE MRN: [id number] Date of Birth: 03/12/1946   Medicare Observation Status Notification Given:  Yes    Erenest Rasher, RN 03/10/2016, 4:18 PM

## 2016-03-10 NOTE — Procedures (Signed)
I was present at this session.  I have reviewed the session itself and made appropriate changes.  HD via LUA AVF.  Lower vol, follow bp  Israel Werts L 4/10/20177:56 AM

## 2016-03-11 ENCOUNTER — Observation Stay (HOSPITAL_COMMUNITY): Payer: Medicare Other

## 2016-03-11 DIAGNOSIS — I951 Orthostatic hypotension: Secondary | ICD-10-CM

## 2016-03-11 DIAGNOSIS — I2510 Atherosclerotic heart disease of native coronary artery without angina pectoris: Secondary | ICD-10-CM | POA: Diagnosis present

## 2016-03-11 DIAGNOSIS — E43 Unspecified severe protein-calorie malnutrition: Secondary | ICD-10-CM | POA: Diagnosis present

## 2016-03-11 DIAGNOSIS — Z7902 Long term (current) use of antithrombotics/antiplatelets: Secondary | ICD-10-CM | POA: Diagnosis not present

## 2016-03-11 DIAGNOSIS — I25118 Atherosclerotic heart disease of native coronary artery with other forms of angina pectoris: Secondary | ICD-10-CM | POA: Diagnosis not present

## 2016-03-11 DIAGNOSIS — Z992 Dependence on renal dialysis: Secondary | ICD-10-CM | POA: Diagnosis not present

## 2016-03-11 DIAGNOSIS — R079 Chest pain, unspecified: Secondary | ICD-10-CM | POA: Diagnosis not present

## 2016-03-11 DIAGNOSIS — I132 Hypertensive heart and chronic kidney disease with heart failure and with stage 5 chronic kidney disease, or end stage renal disease: Secondary | ICD-10-CM | POA: Diagnosis present

## 2016-03-11 DIAGNOSIS — D631 Anemia in chronic kidney disease: Secondary | ICD-10-CM | POA: Diagnosis not present

## 2016-03-11 DIAGNOSIS — N186 End stage renal disease: Secondary | ICD-10-CM | POA: Diagnosis not present

## 2016-03-11 DIAGNOSIS — W19XXXA Unspecified fall, initial encounter: Secondary | ICD-10-CM | POA: Diagnosis present

## 2016-03-11 DIAGNOSIS — F17200 Nicotine dependence, unspecified, uncomplicated: Secondary | ICD-10-CM | POA: Diagnosis not present

## 2016-03-11 DIAGNOSIS — I5042 Chronic combined systolic (congestive) and diastolic (congestive) heart failure: Secondary | ICD-10-CM | POA: Diagnosis present

## 2016-03-11 DIAGNOSIS — Z7982 Long term (current) use of aspirin: Secondary | ICD-10-CM | POA: Diagnosis not present

## 2016-03-11 DIAGNOSIS — Z681 Body mass index (BMI) 19 or less, adult: Secondary | ICD-10-CM | POA: Diagnosis not present

## 2016-03-11 DIAGNOSIS — Z8673 Personal history of transient ischemic attack (TIA), and cerebral infarction without residual deficits: Secondary | ICD-10-CM | POA: Diagnosis not present

## 2016-03-11 DIAGNOSIS — S62316A Displaced fracture of base of fifth metacarpal bone, right hand, initial encounter for closed fracture: Secondary | ICD-10-CM | POA: Diagnosis present

## 2016-03-11 DIAGNOSIS — Z79899 Other long term (current) drug therapy: Secondary | ICD-10-CM | POA: Diagnosis not present

## 2016-03-11 DIAGNOSIS — I4891 Unspecified atrial fibrillation: Secondary | ICD-10-CM | POA: Diagnosis present

## 2016-03-11 DIAGNOSIS — R0789 Other chest pain: Secondary | ICD-10-CM | POA: Diagnosis not present

## 2016-03-11 DIAGNOSIS — I480 Paroxysmal atrial fibrillation: Secondary | ICD-10-CM | POA: Diagnosis not present

## 2016-03-11 LAB — RAPID URINE DRUG SCREEN, HOSP PERFORMED
AMPHETAMINES: NOT DETECTED
BENZODIAZEPINES: NOT DETECTED
Barbiturates: NOT DETECTED
Cocaine: NOT DETECTED
Opiates: NOT DETECTED
TETRAHYDROCANNABINOL: NOT DETECTED

## 2016-03-11 MED ORDER — ISOSORBIDE MONONITRATE ER 60 MG PO TB24: 60.0000 mg | ORAL_TABLET | Freq: Every day | ORAL | Status: DC

## 2016-03-11 MED ORDER — MIDODRINE HCL 5 MG PO TABS: 5.0000 mg | ORAL_TABLET | Freq: Three times a day (TID) | ORAL | Status: DC

## 2016-03-11 MED ORDER — MIDODRINE HCL 5 MG PO TABS
5.0000 mg | ORAL_TABLET | Freq: Three times a day (TID) | ORAL | Status: DC
  Administered 2016-03-11: 5 mg via ORAL
  Filled 2016-03-11: qty 1

## 2016-03-11 MED ORDER — CINACALCET HCL 30 MG PO TABS: 60.0000 mg | ORAL_TABLET | Freq: Every day | ORAL | Status: DC

## 2016-03-11 MED ORDER — ASPIRIN 325 MG PO TBEC: 325.0000 mg | DELAYED_RELEASE_TABLET | Freq: Every day | ORAL | Status: DC

## 2016-03-11 MED ORDER — SODIUM CHLORIDE 0.9 % IV BOLUS (SEPSIS)
250.0000 mL | Freq: Once | INTRAVENOUS | Status: AC
  Administered 2016-03-11: 250 mL via INTRAVENOUS

## 2016-03-11 MED ORDER — CARVEDILOL 3.125 MG PO TABS: 3.1250 mg | ORAL_TABLET | Freq: Two times a day (BID) | ORAL | Status: DC

## 2016-03-11 MED ORDER — CARVEDILOL 3.125 MG PO TABS
3.1250 mg | ORAL_TABLET | Freq: Two times a day (BID) | ORAL | Status: DC
Start: 2016-03-11 — End: 2016-03-12
  Administered 2016-03-11 – 2016-03-12 (×2): 3.125 mg via ORAL
  Filled 2016-03-11 (×2): qty 1

## 2016-03-11 NOTE — Evaluation (Signed)
Physical Therapy Evaluation Patient Details Name: BLAND PRASKA MRN: [id number] DOB: 1945/12/15 Today's Date: 03/11/2016   History of Present Illness  Pt is a 70 y/o M who presented w/ c/o chest pain associated w/ SOB, dizziness, and nausea.  After admission pt reports he fell ~3-4 wks ago and his Rt hand was very swollen and continued to cause him pain, x-ray revealed displaced fx Rt base of 5th metacarpal.  Pt's PMH includes anxiety, MI, a-fib, CVA, blindness.    Clinical Impression  Pt admitted with above diagnosis. Pt currently with functional limitations due to the deficits listed below (see PT Problem List). Mr. Vliet presents w/ decreased strength and is orthostatic this session.  BP sitting EOB 107/56, standing at bedside 80/49.  He has a displaced fx at base of Rt 5th metacarpal due to a previous fall.  Recommending [name] at d/c as pt lives alone and has had multiple falls over the past few months. Pt will benefit from skilled PT to increase their independence and safety with mobility to allow discharge to the venue listed below.      Follow Up Recommendations [name];Supervision for mobility/OOB    Equipment Recommendations  None recommended by PT    Recommendations for Other Services OT consult     Precautions / Restrictions Precautions Precautions: Fall Required Braces or Orthoses: Other Brace/Splint Other Brace/Splint: wrist brace applied by orthotech at end of PT session Restrictions Weight Bearing Restrictions: Yes RUE Weight Bearing: Non weight bearing Other Position/Activity Restrictions: No specific orders but followed NWB due to x-ray results of displaced fx at base of 5th metacarpal      Mobility  Bed Mobility Overal bed mobility: Needs Assistance Bed Mobility: Supine to Sit;Sit to Supine     Supine to sit: Min guard;HOB elevated Sit to supine: Min guard   General bed mobility comments: Increased time and effort and use of bed rail  Transfers Overall  transfer level: Needs assistance Equipment used: 1 person hand held assist Transfers: Sit to/from Stand Sit to Stand: Min assist         General transfer comment: Required 1 person HHA to steady w/ sit<>stand.  Pt c/o dizzines upon standing.  BP sitting EOB 107/56, standing at bedside 80/49.  Ambulation/Gait             General Gait Details: not safe to assess at this time  Stairs            Wheelchair Mobility    Modified Rankin (Stroke Patients Only)       Balance Overall balance assessment: Needs assistance;History of Falls (2 falls already in 2017) Sitting-balance support: Bilateral upper extremity supported;Feet supported Sitting balance-Leahy Scale: Fair Sitting balance - Comments: close min guard assist due to c/o dizziness   Standing balance support: Single extremity supported;During functional activity Standing balance-Leahy Scale: Poor Standing balance comment: Relies on UE support for static standing                             Pertinent Vitals/Pain Pain Assessment: 0-10 Pain Score: 5  Pain Location: Rt hand and Rt ankle (thinks he injured his Rt ankle when he fell back in Jan ) Pain Descriptors / Indicators: Throbbing;Stabbing Pain Intervention(s): Limited activity within patient's tolerance;Monitored during session    [place] expects to be discharged to:: Private residence Living Arrangements: Alone Available Help at Discharge: Personal care attendant;Available PRN/intermittently Type of Home: House Home Access: Ramped  entrance     Home Layout: One level Home Equipment: [place] - 4 wheels;Cane - single point;Bedside commode;Wheelchair - manual Additional Comments: [place] comes 3hrs 5x/wk for cooking and cleaning.  Has HHRN who assists w/ medications.     Prior Function Level of Independence: Needs assistance   Gait / Transfers Assistance Needed: Uses cane when in home, uses RW when going to  the store, uses WC when going to dialysis  ADL's / Homemaking Assistance Needed: Uses BSC as a shower chair but has difficulty due to fatigue.  Gets mobile wheels 5days/wk so he cooks for himself on the weekend.        Hand Dominance   Dominant Hand: Right    Extremity/Trunk Assessment   Upper Extremity Assessment: Defer to OT evaluation (displaced fx Rt base of 5th metacarpal)           Lower Extremity Assessment: RLE deficits/detail;LLE deficits/detail RLE Deficits / Details: 1/5 DF; 2/5 knee flexion; 3/5 knee extension, hip abd, hip add. (h/o CVA) LLE Deficits / Details: strength grossly 4-/5     Communication   Communication: No difficulties  Cognition Arousal/Alertness: Awake/alert Behavior During Therapy: WFL for tasks assessed/performed Overall Cognitive Status: Within Functional Limits for tasks assessed                      General Comments      Exercises        Assessment/Plan    PT Assessment Patient needs continued PT services  PT Diagnosis Difficulty walking;Generalized weakness;Acute pain   PT Problem List Decreased strength;Decreased activity tolerance;Decreased balance;Decreased knowledge of use of DME;Decreased safety awareness;Decreased knowledge of precautions;Pain;Cardiopulmonary status limiting activity  PT Treatment Interventions DME instruction;Gait training;Functional mobility training;Therapeutic activities;Therapeutic exercise;Balance training;Patient/family education   PT Goals (Current goals can be found in the Care Plan section) Acute Rehab PT Goals Patient Stated Goal: to feel better and to go to rehab before home PT Goal Formulation: With patient Time For Goal Achievement: 03/25/16 Potential to Achieve Goals: Good    Frequency Min 3X/week   Barriers to discharge Decreased caregiver support Lives alone    Co-evaluation               End of Session Equipment Utilized During Treatment: Gait belt Activity  Tolerance: Treatment limited secondary to medical complications (Comment) (hypotensive) Patient left: in bed;with call bell/phone within reach;with nursing/sitter in room;with bed alarm set Nurse Communication: Mobility status;Other (comment);Weight bearing status (BP)         Time: [id number] PT Time Calculation (min) (ACUTE ONLY): 27 min   Charges:   PT Evaluation $PT Eval Low Complexity: 1 Procedure PT Treatments $Therapeutic Activity: 8-22 mins   PT G Codes:       Collie Siad PT, DPT  Pager: [phone] Phone: [phone] 03/11/2016, 2:28 PM

## 2016-03-11 NOTE — Progress Notes (Signed)
Orthopedic Tech Progress Note Patient Details:  Trevor [name] [birth year] [id number]  Ortho Devices Type of Ortho Device: Velcro wrist splint Ortho Device/Splint Location: rue Ortho Device/Splint Interventions: Application Viewed order from doctor's order list  Hildred Priest 03/11/2016, 12:24 PM

## 2016-03-11 NOTE — Progress Notes (Signed)
CSW consulted for potential [name] placement.  Pt is not agreeable to [name] and wants to go home with home health services- states he has aid that comes in the morning and that his daughter and granddaughter can be available after work/class.  Pt informed RNCM.  CSW spoke with [facility] where pt was from 2/2-3/9- they can take pt with Medicaid as primary payor if pt pays outstanding bill of $1,371 prior to admission  If pt changes his mind we can place under [place] insurance or with Medicaid paying room and board and Medicare paying for rehab  CSW signing off at this time  Domenica Reamer, [place] Worker [phone]

## 2016-03-11 NOTE — Progress Notes (Signed)
Orthostatic Vitals checked per MD order..   Lying BP 152/60 HR 67  Sitting BP 119/78 HR 77  Standing BP 112/56 HR 87  Standing BP at 3 minutes  91/48 HR 91.  Patient became "slightly dizzy" during the three minute Standing BP.   MD notified. Will continue to monitor and recheck at 07:00 03/12/16

## 2016-03-11 NOTE — Progress Notes (Addendum)
Received call back from [facility], and they do have [place] for pt's [facility] RN, PT and OT. Will resume care post dc. Jonnie Finner RN CCM Case Mgmt phone [phone]  1233 pm Contacted [place] CSW, [place] # pager [phone]. Waiting call back. PT recommending [name], wanted to check to see if pt had benefits with VA for [name]. Pt currently observation and does not have 3 IP night qualifying stay for [name] with Medicare. Contacted CSW, [place] with possible referral for [name]. Jonnie Finner RN CCM Case Mgmt phone [phone]  [place] CSW, [phone] ext [phone] and left message for return call.  Jonnie Finner RN CCM Case Mgmt phone [phone]  1500 NCM attempted contact to son, Medford Lockler # [phone], left message and dtr, Durrel Ostrom # [phone], number is non working number. Jonnie Finner RN CCM Case Mgmt phone [phone] Received call back from [place], [place]. States he could not provided 24 hour care. He lives in [place]. Did speak to dtr, [place] # [phone]. States she works today from 5pm-9pm but could pick him up this evening once she gets off. States she is not able to provide 24 hours assistance at home. She works and attends school. She does assist him as much as possible. States she and family members have discussed with pt on many occassions ALF to ensure safety because he does live alone. Pt has insisted on going back home when dc for previous [name]-rehab few weeks ago. He receives his monthly retirement check and would have to give up if dc to ALF. CSW notified attending. Contacted CSW for follow up on [name] placement. Attempted call to [name]. Jonnie Finner RN CCM Case Mgmt phone [phone]  [place], [phone] ext [phone]. Spoke to [place] and pt has [name]-rehab but not long term under his VA benefits. Will email NCM referral form. Contacted [place] CSW and she is working on [employer]. Jonnie Finner RN CCM Case  Mgmt phone [phone]  [phone] UR completed with medical advisor. NCM and CSW spoke with pt and he is adamantly refusing [name]-rehab. Discussed the benefits of continued rehab and safety issues with dc home without 24 hour assistance. Pt verbalized and understanding. NCM made attending aware. Jonnie Finner RN CCM Case Mgmt phone [phone]  03/12/2016 0900 Pt decided to go to [name]. CSW will work on [name]. Jonnie Finner RN CCM Case Mgmt phone [phone]  03/13/2016 [phone] Faxed dc summary and H&P to [facility] Contacted [facility] to make aware of dc to [name]. Jonnie Finner RN CCM Case Mgmt phone [phone]

## 2016-03-11 NOTE — Progress Notes (Addendum)
PROGRESS NOTE  Trevor [name] [id number] DOB: January 02, 1946 DOA: 03/08/2016 PCP: [name], MD  Assessment/Plan: Chest pain -tele -no plans for intervention currently -chest pain free -BP control- goal  Around 150 as patient is dilaysis  Orthostatic hypotension -was issues last hospitalizations -flomax d/c'd -was on midodrine but did not come to hospital on that medicine -add 5 mg TID  CAD (coronary artery disease) -On Plavix, ASA,and Carvedilol and Atorvastatin Rx  Chronic combined systolic and diastolic CHF (congestive heart failure) (HCC) s/p dialysis  Right hand fracture -has appointment with hand surgery on d/c paperwork on Thursday -place splint  ESRD on hemodialysis ([place]) -in obs so unable to get done in hospital  Essential hypertension -resume home meds and adjust accordingly -BP meds recently changed for orthostatic hypotension (2/17)  A-fib ([place]) -Cardiac Monitoring  Anemia of renal disease  Protein-calorie malnutrition, severe ([place]) Nutrition Consult for Caloric Needs  Tobacco use disorder -encouraged cessation  Code Status: full Family Communication: patient- called multiple family members, no answer Disposition Plan: [name] but obs-- needs family supervision   Consultants:  Cards  renal  Procedures:      HPI/Subjective: +symptoms when standing up of orthostasis  Objective: Filed Vitals:   03/24/16 0804 03/24/16 1434  BP: 124/54 126/54  Pulse: 67   Temp: 98.3 F (36.8 C)   Resp:      Intake/Output Summary (Last 24 hours) at 24-Mar-2016 1446 Last data filed at 03/10/16 2330  Gross per 24 hour  Intake    360 ml  Output    100 ml  Net    260 ml   Filed Weights   03/10/16 0750 03/10/16 1205 2016-03-24 0453  Weight: 61.7 kg (136 lb 0.4 oz) 60.3 kg (132 lb 15 oz) 59.33 kg (130 lb 12.8 oz)    Exam:   General:  Awake, NAD  Cardiovascular: rrr  Respiratory: clear  Abdomen: +BS, soft  Musculoskeletal: no  edema   Data Reviewed: Basic Metabolic Panel:  Recent Labs Lab 03/08/16 2111 03/09/16 0547 03/10/16 0827  NA 139 144 141  K 3.7 4.1 4.5  CL 106 107 106  CO2 21* 24 22  GLUCOSE 84 76 79  BUN 29* 35* 46*  CREATININE 6.56* 7.90* 10.21*  CALCIUM 8.7* 9.6 9.0  PHOS  --   --  4.5   Liver Function Tests:  Recent Labs Lab 03/10/16 0827  ALBUMIN 2.8*   No results for input(s): LIPASE, AMYLASE in the last 168 hours. No results for input(s): AMMONIA in the last 168 hours. CBC:  Recent Labs Lab 03/08/16 2111 03/09/16 0547  WBC 4.7 4.6  HGB 10.0* 9.8*  HCT 29.1* 30.3*  MCV 85.8 86.3  PLT 136* 143*   Cardiac Enzymes:  Recent Labs Lab 03/09/16 0328 03/09/16 0547 03/09/16 1226  TROPONINI 0.16* 0.19* 0.16*   BNP (last 3 results) No results for input(s): BNP in the last 8760 hours.  ProBNP (last 3 results) No results for input(s): PROBNP in the last 8760 hours.  CBG: No results for input(s): GLUCAP in the last 168 hours.  No results found for this or any previous visit (from the past 240 hour(s)).   Studies: Dg Hand Complete Right  03-24-2016  CLINICAL DATA:  Status post fall 1 month ago with persistent right hand pain. EXAM: RIGHT HAND - COMPLETE 3+ VIEW COMPARISON:  None. FINDINGS: There is comminuted displaced fracture at the base of the fifth metacarpal. No other fracture is identified. The soft tissues are normal. IMPRESSION:  Comminuted displaced fracture at the base of the fifth metacarpal. Electronically Signed   By: Abelardo Diesel M.D.   On: 03/11/2016 10:34    Scheduled Meds: . aspirin EC  325 mg Oral Daily  . atorvastatin  40 mg Oral q1800  . calcium acetate  667 mg Oral TID WC  . carvedilol  3.125 mg Oral BID WC  . cinacalcet  60 mg Oral Q breakfast  . clopidogrel  75 mg Oral Daily  . darbepoetin (ARANESP) injection - DIALYSIS  100 mcg Intravenous Q Mon-HD  . doxercalciferol  3 mcg Intravenous Q M,W,F-HD  . gabapentin  100 mg Oral TID  . heparin   5,000 Units Subcutaneous 3 times per day  . isosorbide mononitrate  60 mg Oral Daily  . midodrine  5 mg Oral TID WC  . multivitamin  1 tablet Oral QHS  . pantoprazole  40 mg Oral Daily  . sodium chloride flush  3 mL Intravenous Q12H   Continuous Infusions:  Antibiotics Given (last 72 hours)    None      Principal Problem:   Chest pain Active Problems:   Tobacco use disorder   A-fib (HCC)   Chronic combined systolic and diastolic CHF (congestive heart failure) (HCC)   ESRD on hemodialysis (HCC)   Anemia of renal disease   Protein-calorie malnutrition, severe (HCC)   CAD (coronary artery disease)   Essential hypertension    Time spent: 15 min    [place] [facility] Pager [phone]. If 7PM-7AM, please contact night-coverage at [email or web address], password [facility] 03/11/2016, 2:46 PM

## 2016-03-11 NOTE — Progress Notes (Signed)
12:30 Pt working with PT. Pt became dizzy when standing with physical therapy. Pt BP sitting at side of bed was 107/56. BP when standing 80/49. MD made aware. See new orders for 250 cc bolus, then reassess. Will continue to monitor.  14:06 Bolus complete. Orthostatics taken again. Lying BP 128/69. Sitting BP 123/59. Standing BP 82/55. Patient became dizzy and lightheaded. Pt states, "everything went black for a second". Patient eased back into bed. Will notify MD and monitor closely.

## 2016-03-11 NOTE — Progress Notes (Signed)
Subjective: Interval History: has no complaint .  Objective: Vital signs in last 24 hours: Temp:  [97.5 F (36.4 C)-98.7 F (37.1 C)] 98.7 F (37.1 C) (04/11 0453) Pulse Rate:  [55-83] 65 (04/10 2026) Resp:  [13-22] 18 (04/11 0453) BP: (78-137)/(39-67) 119/52 mmHg (04/11 0453) SpO2:  [98 %-100 %] 98 % (04/11 0453) Weight:  [59.33 kg (130 lb 12.8 oz)-61.7 kg (136 lb 0.4 oz)] 59.33 kg (130 lb 12.8 oz) (04/11 0453) Weight change: 0.192 kg (6.8 oz)  Intake/Output from previous day: 04/10 0701 - 04/11 0700 In: 600 [P.O.:600] Out: 1515 [Urine:100] Intake/Output this shift:    General appearance: alert, cooperative, no distress and slowed mentation Resp: clear to auscultation bilaterally Cardio: S1, S2 normal and systolic murmur: systolic ejection 2/6, crescendo at 2nd left intercostal space GI: pos bs, soft Extremities: AVF LUA  Lab Results:  Recent Labs  03/08/16 2111 03/09/16 0547  WBC 4.7 4.6  HGB 10.0* 9.8*  HCT 29.1* 30.3*  PLT 136* 143*   BMET:  Recent Labs  03/09/16 0547 03/10/16 0827  NA 144 141  K 4.1 4.5  CL 107 106  CO2 24 22  GLUCOSE 76 79  BUN 35* 46*  CREATININE 7.90* 10.21*  CALCIUM 9.6 9.0   No results for input(s): PTH in the last 72 hours. Iron Studies: No results for input(s): IRON, TIBC, TRANSFERRIN, FERRITIN in the last 72 hours.  Studies/Results: No results found.  I have reviewed the patient's current medications.  Assessment/Plan: 1 ESRD HD MWF 2 CP resolved 3 CAD 4 Anemia P will s/o, let us know if he is admitted will see at outpatient unit      Quantavia Frith L 03/11/2016,7:29 AM

## 2016-03-12 DIAGNOSIS — R0789 Other chest pain: Secondary | ICD-10-CM

## 2016-03-12 DIAGNOSIS — I480 Paroxysmal atrial fibrillation: Secondary | ICD-10-CM

## 2016-03-12 DIAGNOSIS — E43 Unspecified severe protein-calorie malnutrition: Secondary | ICD-10-CM

## 2016-03-12 LAB — RENAL FUNCTION PANEL
ANION GAP: 15 (ref 5–15)
Albumin: 3.1 g/dL — ABNORMAL LOW (ref 3.5–5.0)
BUN: 38 mg/dL — ABNORMAL HIGH (ref 6–20)
CALCIUM: 9 mg/dL (ref 8.9–10.3)
CHLORIDE: 100 mmol/L — AB (ref 101–111)
CO2: 25 mmol/L (ref 22–32)
CREATININE: 10.01 mg/dL — AB (ref 0.61–1.24)
GFR, EST AFRICAN AMERICAN: 5 mL/min — AB (ref >60)
GFR, EST NON AFRICAN AMERICAN: 5 mL/min — AB (ref >60)
Glucose, Bld: 88 mg/dL (ref 65–99)
Phosphorus: 4.7 mg/dL — ABNORMAL HIGH (ref 2.5–4.6)
Potassium: 4.8 mmol/L (ref 3.5–5.1)
SODIUM: 140 mmol/L (ref 135–145)

## 2016-03-12 LAB — CBC
HCT: 28.7 % — ABNORMAL LOW (ref 39.0–52.0)
HEMOGLOBIN: 9.4 g/dL — AB (ref 13.0–17.0)
MCH: 27.9 pg (ref 26.0–34.0)
MCHC: 32.8 g/dL (ref 30.0–36.0)
MCV: 85.2 fL (ref 78.0–100.0)
Platelets: 141 10*3/uL — ABNORMAL LOW (ref 150–400)
RBC: 3.37 MIL/uL — AB (ref 4.22–5.81)
RDW: 13.3 % (ref 11.5–15.5)
WBC: 4.4 10*3/uL (ref 4.0–10.5)

## 2016-03-12 LAB — GLUCOSE, CAPILLARY: GLUCOSE-CAPILLARY: 91 mg/dL (ref 65–99)

## 2016-03-12 MED ORDER — DOXERCALCIFEROL 4 MCG/2ML IV SOLN
INTRAVENOUS | Status: AC
  Administered 2016-03-12: 3 ug
  Filled 2016-03-12: qty 2

## 2016-03-12 MED ORDER — MIDODRINE HCL 5 MG PO TABS
10.0000 mg | ORAL_TABLET | Freq: Three times a day (TID) | ORAL | Status: DC
  Administered 2016-03-12 (×2): 10 mg via ORAL
  Filled 2016-03-12 (×2): qty 2

## 2016-03-12 MED ORDER — ISOSORBIDE MONONITRATE ER 30 MG PO TB24: 30.0000 mg | ORAL_TABLET | Freq: Every day | ORAL | Status: DC

## 2016-03-12 MED ORDER — MIDODRINE HCL 5 MG PO TABS
ORAL_TABLET | ORAL | Status: AC
  Filled 2016-03-12: qty 2

## 2016-03-12 NOTE — Procedures (Signed)
I was present at this session.  I have reviewed the session itself and made appropriate changes.   HD via LUA avf.  bp 160s. Access press ok.   Trevor [name] L 4/12/20171:10 PM

## 2016-03-12 NOTE — Progress Notes (Signed)
Subjective: Interval History: has complaints wants to leave.  Objective: Vital signs in last 24 hours: Temp:  [97.6 F (36.4 C)-98.4 F (36.9 C)] 98.1 F (36.7 C) (04/12 0756) Pulse Rate:  [63-71] 63 (04/12 0756) Resp:  [16-19] 18 (04/12 0756) BP: (126-171)/(54-80) 156/80 mmHg (04/12 0803) SpO2:  [98 %-100 %] 99 % (04/12 0756) Weight:  [60.374 kg (133 lb 1.6 oz)] 60.374 kg (133 lb 1.6 oz) (04/12 0500) Weight change: -1.326 kg (-2 lb 14.8 oz)  Intake/Output from previous day: 04/11 0701 - 04/12 0700 In: 580 [P.O.:580] Out: -  Intake/Output this shift: Total I/O In: 120 [P.O.:120] Out: -   General appearance: alert, cooperative and no distress Resp: clear to auscultation bilaterally Cardio: regularly irregular rhythm and systolic murmur: systolic ejection 2/6, decrescendo at 2nd left intercostal space GI: soft, non-tender; bowel sounds normal; no masses,  no organomegaly Extremities: AVF B&T  Lab Results: No results for input(s): WBC, HGB, HCT, PLT in the last 72 hours. BMET:  Recent Labs  03/10/16 0827  NA 141  K 4.5  CL 106  CO2 22  GLUCOSE 79  BUN 46*  CREATININE 10.21*  CALCIUM 9.0   No results for input(s): PTH in the last 72 hours. Iron Studies: No results for input(s): IRON, TIBC, TRANSFERRIN, FERRITIN in the last 72 hours.  Studies/Results: Dg Hand Complete Right  03/11/2016  CLINICAL DATA:  Status post fall 1 month ago with persistent right hand pain. EXAM: RIGHT HAND - COMPLETE 3+ VIEW COMPARISON:  None. FINDINGS: There is comminuted displaced fracture at the base of the fifth metacarpal. No other fracture is identified. The soft tissues are normal. IMPRESSION: Comminuted displaced fracture at the base of the fifth metacarpal. Electronically Signed   By: Abelardo Diesel M.D.   On: 03/11/2016 10:34    I have reviewed the patient's current medications.  Assessment/Plan: 1 ESRD will do HD 2 Anemia esa 3 HPTH vit D 4 chronic orthostasis 5 malnutrition 6  Dispo per primary P HD, esa, to hd in chair, stop coreg    LOS: 1 day   Mikiala Fugett L 03/12/2016,9:40 AM

## 2016-03-12 NOTE — Progress Notes (Signed)
   03/12/16 0453  Vitals  Temp 97.7 F (36.5 C)  Temp Source Oral  BP (!) 160/57 mmHg  BP Location Right Arm  BP Method Automatic  Patient Position (if appropriate) Lying  ECG Heart Rate 63  Cardiac Rhythm NSR  Resp 16  Orthostatic Lying   BP- Lying 160/57 mmHg  Pulse- Lying 63  Orthostatic Sitting  BP- Sitting (!) 140/93 mmHg  Pulse- Sitting 85  Orthostatic Standing at 0 minutes  BP- Standing at 0 minutes 104/53 mmHg  Pulse- Standing at 0 minutes 65  Orthostatic Standing at 3 minutes  BP- Standing at 3 minutes 103/55 mmHg  Pulse- Standing at 3 minutes 87  Oxygen Therapy  SpO2 100 %  O2 Device Room Air

## 2016-03-12 NOTE — Clinical Social Work Placement (Signed)
   CLINICAL SOCIAL WORK PLACEMENT  NOTE  Date:  03/12/2016  Patient Details  Name: Trevor [name] MRN: [id number] Date of Birth: 03-02-1946  Clinical Social Work is seeking post-discharge placement for this patient at the [place] level of care (*CSW will initial, date and re-position this form in  chart as items are completed):  Yes   Patient/family provided with [place] Work Department's list of facilities offering this level of care within the geographic area requested by the patient (or if unable, by the patient's family).  Yes   Patient/family informed of their freedom to choose among providers that offer the needed level of care, that participate in Medicare, Medicaid or managed care program needed by the patient, have an available bed and are willing to accept the patient.  Yes   Patient/family informed of [facility]'s ownership interest in [facility] and [facility], as well as of the fact that they are under no obligation to receive care at these facilities.  PASRR submitted to EDS on       PASRR number received on       Existing PASRR number confirmed on 03/12/16     FL2 transmitted to all facilities in geographic area requested by pt/family on 03/12/16     FL2 transmitted to all facilities within larger geographic area on       Patient informed that his/her managed care company has contracts with or will negotiate with certain facilities, including the following:        Yes   Patient/family informed of bed offers received.  Patient chooses bed at [facility]     Physician recommends and patient chooses bed at      Patient to be transferred to [facility] on 03/12/16.  Patient to be transferred to facility by PTAR     Patient family notified on 03/12/16 of transfer.  Name of family member notified:  [name]     PHYSICIAN       Additional Comment:    _______________________________________________ [place] Mon,  LCSW 03/12/2016, 5:30 PM

## 2016-03-12 NOTE — Progress Notes (Signed)
Called report to Nurse Enid Derry at [facility].

## 2016-03-12 NOTE — Progress Notes (Signed)
Patient will discharge to [facility] Anticipated discharge date:4/12 Family notified: pt dtr Transportation by PTAR- called for approx 6pm pick up- pt will be back from dialysis before 6 per dialysis team- PTAR number given to RN in case pt becomes unstable for transfer After hours PTAR: [phone] ext [phone] then ext [phone]  CSW signing off.  Domenica Reamer, [place] Social Worker [phone]
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
## 2016-03-12 NOTE — NC FL2 (Signed)
[place] MEDICAID FL2 LEVEL OF CARE SCREENING TOOL     IDENTIFICATION  Patient Name: Trevor [name] Birthdate: Feb 14, 1946 Sex: male Admission Date (Current Location): 03/08/2016  [facility] and [place] Number:  Herbalist and Address:  [facility], [place] [address], [place], [place] [address]      Provider Number: [id number]  Attending Physician Name and Address:  Lavina Hamman, MD  Relative Name and Phone Number:       Current Level of Care: Hospital Recommended Level of Care: [place] Prior Approval Number:    Date Approved/Denied:   PASRR Number:    Discharge Plan: [name]    Current Diagnoses: Patient Active Problem List   Diagnosis Date Noted  . Essential hypertension 03/08/2016  . Orthostatic hypotension 01/12/2016  . Hyperlipidemia 01/12/2016  . Chest pain at rest   . ESRD (end stage renal disease) ([place])   . Cough   . Weakness generalized 12/26/2015  . Hypertensive heart disease with CHF (congestive heart failure) ([place])   . Chest pain 08/12/2015  . Malignant hypertension 08/12/2015  . Carotid stenosis   . TIA (transient ischemic attack) 04/04/2015  . Weakness 04/03/2015  . Stroke ([place]) 04/03/2015  . Abdominal aortic aneurysm ([place]) 03/06/2015  . Generalized weakness 09/08/2014  . Acute encephalopathy 09/07/2014  . CAD (coronary artery disease) 08/08/2014  . NSTEMI (non-ST elevated myocardial infarction) - Presumed Type II in setting of HTN Emergency 07/07/2014  . Hypertensive emergency 07/05/2014  . Acute on chronic combined systolic and diastolic CHF (congestive heart failure) ([place]) 07/05/2014  . Protein-calorie malnutrition, severe ([place]) 05/20/2014  . Hypokalemia 05/19/2014  . Syncope and collapse 05/19/2014  . ESRD on hemodialysis ([place]) 05/19/2014  . Anemia of renal disease 05/19/2014  . Coronary artery disease, non-occlusive: Cath 2013 - Moderate D1 & RCA disease 09/28/2012  . Anemia 09/27/2012  .  Secondary hyperparathyroidism ([place]) 09/27/2012  . Non compliance with medical treatment 09/27/2012  . Chronic combined systolic and diastolic CHF (congestive heart failure) ([place]) 10/10/2011  . A-fib ([place]) 10/08/2011  . History of stroke 10/07/2011  . Cocaine abuse 10/07/2011  . Severe sinus bradycardia 10/07/2011  . Nonischemic cardiomyopathy ([place]) 09/18/2011  . Tobacco use disorder 08/07/2011  . Bruit 08/07/2011    Orientation RESPIRATION BLADDER Height & Weight     Self, Time, Situation, Place    Continent Weight: 133 lb 1.6 oz (60.374 kg) Height:  6' (182.9 cm)  BEHAVIORAL SYMPTOMS/MOOD NEUROLOGICAL BOWEL NUTRITION STATUS      Continent Diet (fluid restriction, renal)  AMBULATORY STATUS COMMUNICATION OF NEEDS Skin   Extensive Assist Verbally                         Personal Care Assistance Level of Assistance  Bathing Bathing Assistance: Limited assistance   Dressing Assistance: Limited assistance     Functional Limitations Info             SPECIAL CARE FACTORS FREQUENCY  PT (By licensed PT), OT (By licensed OT)     PT Frequency: 5/wk OT Frequency: 5/wk            Contractures      Additional Factors Info  Code Status, Allergies Code Status Info: FULL Allergies Info: NKA           Current Medications (03/12/2016):  This is the current hospital active medication list Current Facility-Administered Medications  Medication Dose Route Frequency Provider Last Rate Last  Dose  . 0.9 %  sodium chloride infusion  250 mL Intravenous PRN Theressa Millard, MD      . acetaminophen (TYLENOL) tablet 650 mg  650 mg Oral Q6H PRN Theressa Millard, MD       Or  . acetaminophen (TYLENOL) suppository 650 mg  650 mg Rectal Q6H PRN Theressa Millard, MD      . aspirin EC tablet 325 mg  325 mg Oral Daily Theressa Millard, MD   325 mg at 03/11/16 [id number]  . atorvastatin (LIPITOR) tablet 40 mg  40 mg Oral q1800 Theressa Millard, MD   40 mg at 03/11/16 1709  .  calcium acetate (PHOSLO) capsule 667 mg  667 mg Oral TID WC Theressa Millard, MD   667 mg at 03/12/16 0759  . carvedilol (COREG) tablet 3.125 mg  3.125 mg Oral BID WC Geradine Girt, DO   3.125 mg at 03/12/16 0759  . cinacalcet (SENSIPAR) tablet 60 mg  60 mg Oral Q breakfast Theressa Millard, MD   60 mg at 03/12/16 0759  . clopidogrel (PLAVIX) tablet 75 mg  75 mg Oral Daily Theressa Millard, MD   75 mg at 03/11/16 0938  . Darbepoetin Alfa (ARANESP) injection 100 mcg  100 mcg Intravenous Q Mon-HD Mauricia Area, MD   100 mcg at 03/10/16 0953  . doxercalciferol (HECTOROL) injection 3 mcg  3 mcg Intravenous Q M,W,F-HD [name], MD   3 mcg at 03/10/16 0954  . gabapentin (NEURONTIN) capsule 100 mg  100 mg Oral TID Theressa Millard, MD   100 mg at 03/11/16 2251  . heparin injection 5,000 Units  5,000 Units Subcutaneous 3 times per day Theressa Millard, MD   5,000 Units at 03/12/16 0520  . isosorbide mononitrate (IMDUR) 24 hr tablet 30 mg  30 mg Oral Daily Lavina Hamman, MD      . midodrine (PROAMATINE) tablet 10 mg  10 mg Oral TID WC Lavina Hamman, MD   10 mg at 03/12/16 0800  . multivitamin (RENA-VIT) tablet 1 tablet  1 tablet Oral QHS Geradine Girt, DO   1 tablet at 03/11/16 2251  . ondansetron (ZOFRAN) tablet 4 mg  4 mg Oral Q6H PRN Theressa Millard, MD       Or  . ondansetron (ZOFRAN) injection 4 mg  4 mg Intravenous Q6H PRN Theressa Millard, MD      . oxyCODONE (Oxy IR/ROXICODONE) immediate release tablet 5 mg  5 mg Oral Q4H PRN Theressa Millard, MD   5 mg at 03/11/16 1958  . pantoprazole (PROTONIX) EC tablet 40 mg  40 mg Oral Daily Theressa Millard, MD   40 mg at 03/11/16 0938  . sodium chloride flush (NS) 0.9 % injection 3 mL  3 mL Intravenous Q12H Theressa Millard, MD   3 mL at 03/11/16 2200  . sodium chloride flush (NS) 0.9 % injection 3 mL  3 mL Intravenous PRN Theressa Millard, MD         Discharge Medications: Please see discharge summary for a list of  discharge medications.  Relevant Imaging Results:  Relevant Lab Results:   Additional Information SSN [id number] Dialysis MWF at [place], [place], [place]

## 2016-03-13 DIAGNOSIS — N186 End stage renal disease: Secondary | ICD-10-CM | POA: Diagnosis not present

## 2016-03-13 DIAGNOSIS — R41841 Cognitive communication deficit: Secondary | ICD-10-CM | POA: Diagnosis not present

## 2016-03-13 DIAGNOSIS — I5042 Chronic combined systolic (congestive) and diastolic (congestive) heart failure: Secondary | ICD-10-CM | POA: Diagnosis not present

## 2016-03-13 DIAGNOSIS — R1319 Other dysphagia: Secondary | ICD-10-CM | POA: Diagnosis not present

## 2016-03-13 DIAGNOSIS — I10 Essential (primary) hypertension: Secondary | ICD-10-CM | POA: Diagnosis not present

## 2016-03-13 DIAGNOSIS — R2689 Other abnormalities of gait and mobility: Secondary | ICD-10-CM | POA: Diagnosis not present

## 2016-03-13 DIAGNOSIS — R2681 Unsteadiness on feet: Secondary | ICD-10-CM | POA: Diagnosis not present

## 2016-03-13 DIAGNOSIS — R0789 Other chest pain: Secondary | ICD-10-CM | POA: Diagnosis not present

## 2016-03-14 DIAGNOSIS — R2689 Other abnormalities of gait and mobility: Secondary | ICD-10-CM | POA: Diagnosis not present

## 2016-03-14 DIAGNOSIS — R41841 Cognitive communication deficit: Secondary | ICD-10-CM | POA: Diagnosis not present

## 2016-03-14 DIAGNOSIS — I5042 Chronic combined systolic (congestive) and diastolic (congestive) heart failure: Secondary | ICD-10-CM | POA: Diagnosis not present

## 2016-03-14 DIAGNOSIS — N186 End stage renal disease: Secondary | ICD-10-CM | POA: Diagnosis not present

## 2016-03-14 DIAGNOSIS — R1319 Other dysphagia: Secondary | ICD-10-CM | POA: Diagnosis not present

## 2016-03-14 DIAGNOSIS — R2681 Unsteadiness on feet: Secondary | ICD-10-CM | POA: Diagnosis not present

## 2016-03-15 DIAGNOSIS — I5042 Chronic combined systolic (congestive) and diastolic (congestive) heart failure: Secondary | ICD-10-CM | POA: Diagnosis not present

## 2016-03-15 DIAGNOSIS — R1319 Other dysphagia: Secondary | ICD-10-CM | POA: Diagnosis not present

## 2016-03-15 DIAGNOSIS — R2689 Other abnormalities of gait and mobility: Secondary | ICD-10-CM | POA: Diagnosis not present

## 2016-03-15 DIAGNOSIS — R41841 Cognitive communication deficit: Secondary | ICD-10-CM | POA: Diagnosis not present

## 2016-03-15 DIAGNOSIS — R2681 Unsteadiness on feet: Secondary | ICD-10-CM | POA: Diagnosis not present

## 2016-03-15 DIAGNOSIS — N186 End stage renal disease: Secondary | ICD-10-CM | POA: Diagnosis not present

## 2016-03-17 DIAGNOSIS — R41841 Cognitive communication deficit: Secondary | ICD-10-CM | POA: Diagnosis not present

## 2016-03-17 DIAGNOSIS — R2689 Other abnormalities of gait and mobility: Secondary | ICD-10-CM | POA: Diagnosis not present

## 2016-03-17 DIAGNOSIS — R1319 Other dysphagia: Secondary | ICD-10-CM | POA: Diagnosis not present

## 2016-03-17 DIAGNOSIS — R2681 Unsteadiness on feet: Secondary | ICD-10-CM | POA: Diagnosis not present

## 2016-03-17 DIAGNOSIS — N186 End stage renal disease: Secondary | ICD-10-CM | POA: Diagnosis not present

## 2016-03-17 DIAGNOSIS — I5042 Chronic combined systolic (congestive) and diastolic (congestive) heart failure: Secondary | ICD-10-CM | POA: Diagnosis not present

## 2016-03-18 DIAGNOSIS — R1319 Other dysphagia: Secondary | ICD-10-CM | POA: Diagnosis not present

## 2016-03-18 DIAGNOSIS — N186 End stage renal disease: Secondary | ICD-10-CM | POA: Diagnosis not present

## 2016-03-18 DIAGNOSIS — I5042 Chronic combined systolic (congestive) and diastolic (congestive) heart failure: Secondary | ICD-10-CM | POA: Diagnosis not present

## 2016-03-18 DIAGNOSIS — R2689 Other abnormalities of gait and mobility: Secondary | ICD-10-CM | POA: Diagnosis not present

## 2016-03-18 DIAGNOSIS — R41841 Cognitive communication deficit: Secondary | ICD-10-CM | POA: Diagnosis not present

## 2016-03-18 DIAGNOSIS — R2681 Unsteadiness on feet: Secondary | ICD-10-CM | POA: Diagnosis not present

## 2016-03-19 DIAGNOSIS — N186 End stage renal disease: Secondary | ICD-10-CM | POA: Diagnosis not present

## 2016-03-19 DIAGNOSIS — I5042 Chronic combined systolic (congestive) and diastolic (congestive) heart failure: Secondary | ICD-10-CM | POA: Diagnosis not present

## 2016-03-19 DIAGNOSIS — R2689 Other abnormalities of gait and mobility: Secondary | ICD-10-CM | POA: Diagnosis not present

## 2016-03-19 DIAGNOSIS — R2681 Unsteadiness on feet: Secondary | ICD-10-CM | POA: Diagnosis not present

## 2016-03-19 DIAGNOSIS — R1319 Other dysphagia: Secondary | ICD-10-CM | POA: Diagnosis not present

## 2016-03-19 DIAGNOSIS — R41841 Cognitive communication deficit: Secondary | ICD-10-CM | POA: Diagnosis not present

## 2016-03-20 DIAGNOSIS — R1319 Other dysphagia: Secondary | ICD-10-CM | POA: Diagnosis not present

## 2016-03-20 DIAGNOSIS — R41841 Cognitive communication deficit: Secondary | ICD-10-CM | POA: Diagnosis not present

## 2016-03-20 DIAGNOSIS — N186 End stage renal disease: Secondary | ICD-10-CM | POA: Diagnosis not present

## 2016-03-20 DIAGNOSIS — I5042 Chronic combined systolic (congestive) and diastolic (congestive) heart failure: Secondary | ICD-10-CM | POA: Diagnosis not present

## 2016-03-20 DIAGNOSIS — R2681 Unsteadiness on feet: Secondary | ICD-10-CM | POA: Diagnosis not present

## 2016-03-20 DIAGNOSIS — R2689 Other abnormalities of gait and mobility: Secondary | ICD-10-CM | POA: Diagnosis not present

## 2016-03-21 DIAGNOSIS — R41841 Cognitive communication deficit: Secondary | ICD-10-CM | POA: Diagnosis not present

## 2016-03-21 DIAGNOSIS — R2689 Other abnormalities of gait and mobility: Secondary | ICD-10-CM | POA: Diagnosis not present

## 2016-03-21 DIAGNOSIS — N186 End stage renal disease: Secondary | ICD-10-CM | POA: Diagnosis not present

## 2016-03-21 DIAGNOSIS — I5042 Chronic combined systolic (congestive) and diastolic (congestive) heart failure: Secondary | ICD-10-CM | POA: Diagnosis not present

## 2016-03-21 DIAGNOSIS — R2681 Unsteadiness on feet: Secondary | ICD-10-CM | POA: Diagnosis not present

## 2016-03-21 DIAGNOSIS — R1319 Other dysphagia: Secondary | ICD-10-CM | POA: Diagnosis not present

## 2016-03-24 DIAGNOSIS — S62316P Displaced fracture of base of fifth metacarpal bone, right hand, subsequent encounter for fracture with malunion: Secondary | ICD-10-CM | POA: Diagnosis not present

## 2016-03-24 DIAGNOSIS — M79641 Pain in right hand: Secondary | ICD-10-CM | POA: Diagnosis not present

## 2016-03-25 DIAGNOSIS — R2689 Other abnormalities of gait and mobility: Secondary | ICD-10-CM | POA: Diagnosis not present

## 2016-03-25 DIAGNOSIS — I5042 Chronic combined systolic (congestive) and diastolic (congestive) heart failure: Secondary | ICD-10-CM | POA: Diagnosis not present

## 2016-03-25 DIAGNOSIS — R2681 Unsteadiness on feet: Secondary | ICD-10-CM | POA: Diagnosis not present

## 2016-03-25 DIAGNOSIS — R41841 Cognitive communication deficit: Secondary | ICD-10-CM | POA: Diagnosis not present

## 2016-03-25 DIAGNOSIS — R1319 Other dysphagia: Secondary | ICD-10-CM | POA: Diagnosis not present

## 2016-03-25 DIAGNOSIS — N186 End stage renal disease: Secondary | ICD-10-CM | POA: Diagnosis not present

## 2016-03-26 DIAGNOSIS — N186 End stage renal disease: Secondary | ICD-10-CM | POA: Diagnosis not present

## 2016-03-26 DIAGNOSIS — R41841 Cognitive communication deficit: Secondary | ICD-10-CM | POA: Diagnosis not present

## 2016-03-26 DIAGNOSIS — R2681 Unsteadiness on feet: Secondary | ICD-10-CM | POA: Diagnosis not present

## 2016-03-26 DIAGNOSIS — R2689 Other abnormalities of gait and mobility: Secondary | ICD-10-CM | POA: Diagnosis not present

## 2016-03-26 DIAGNOSIS — R1319 Other dysphagia: Secondary | ICD-10-CM | POA: Diagnosis not present

## 2016-03-26 DIAGNOSIS — I5042 Chronic combined systolic (congestive) and diastolic (congestive) heart failure: Secondary | ICD-10-CM | POA: Diagnosis not present

## 2016-03-27 DIAGNOSIS — I951 Orthostatic hypotension: Secondary | ICD-10-CM | POA: Diagnosis not present

## 2016-03-27 DIAGNOSIS — R1319 Other dysphagia: Secondary | ICD-10-CM | POA: Diagnosis not present

## 2016-03-27 DIAGNOSIS — R41841 Cognitive communication deficit: Secondary | ICD-10-CM | POA: Diagnosis not present

## 2016-03-27 DIAGNOSIS — E46 Unspecified protein-calorie malnutrition: Secondary | ICD-10-CM | POA: Diagnosis not present

## 2016-03-27 DIAGNOSIS — S6291XA Unspecified fracture of right wrist and hand, initial encounter for closed fracture: Secondary | ICD-10-CM | POA: Diagnosis not present

## 2016-03-27 DIAGNOSIS — N186 End stage renal disease: Secondary | ICD-10-CM | POA: Diagnosis not present

## 2016-03-27 DIAGNOSIS — R2689 Other abnormalities of gait and mobility: Secondary | ICD-10-CM | POA: Diagnosis not present

## 2016-03-27 DIAGNOSIS — I4891 Unspecified atrial fibrillation: Secondary | ICD-10-CM | POA: Diagnosis not present

## 2016-03-27 DIAGNOSIS — I10 Essential (primary) hypertension: Secondary | ICD-10-CM | POA: Diagnosis not present

## 2016-03-27 DIAGNOSIS — R2681 Unsteadiness on feet: Secondary | ICD-10-CM | POA: Diagnosis not present

## 2016-03-27 DIAGNOSIS — I639 Cerebral infarction, unspecified: Secondary | ICD-10-CM | POA: Diagnosis not present

## 2016-03-27 DIAGNOSIS — I5042 Chronic combined systolic (congestive) and diastolic (congestive) heart failure: Secondary | ICD-10-CM | POA: Diagnosis not present

## 2016-03-28 DIAGNOSIS — R2689 Other abnormalities of gait and mobility: Secondary | ICD-10-CM | POA: Diagnosis not present

## 2016-03-28 DIAGNOSIS — R2681 Unsteadiness on feet: Secondary | ICD-10-CM | POA: Diagnosis not present

## 2016-03-28 DIAGNOSIS — N186 End stage renal disease: Secondary | ICD-10-CM | POA: Diagnosis not present

## 2016-03-28 DIAGNOSIS — I5042 Chronic combined systolic (congestive) and diastolic (congestive) heart failure: Secondary | ICD-10-CM | POA: Diagnosis not present

## 2016-03-28 DIAGNOSIS — R1319 Other dysphagia: Secondary | ICD-10-CM | POA: Diagnosis not present

## 2016-03-28 DIAGNOSIS — R41841 Cognitive communication deficit: Secondary | ICD-10-CM | POA: Diagnosis not present

## 2016-03-29 DIAGNOSIS — I5042 Chronic combined systolic (congestive) and diastolic (congestive) heart failure: Secondary | ICD-10-CM | POA: Diagnosis not present

## 2016-03-29 DIAGNOSIS — R2689 Other abnormalities of gait and mobility: Secondary | ICD-10-CM | POA: Diagnosis not present

## 2016-03-29 DIAGNOSIS — R1319 Other dysphagia: Secondary | ICD-10-CM | POA: Diagnosis not present

## 2016-03-29 DIAGNOSIS — R41841 Cognitive communication deficit: Secondary | ICD-10-CM | POA: Diagnosis not present

## 2016-03-29 DIAGNOSIS — R2681 Unsteadiness on feet: Secondary | ICD-10-CM | POA: Diagnosis not present

## 2016-03-29 DIAGNOSIS — N186 End stage renal disease: Secondary | ICD-10-CM | POA: Diagnosis not present

## 2016-03-31 DIAGNOSIS — R0789 Other chest pain: Secondary | ICD-10-CM | POA: Diagnosis not present

## 2016-03-31 DIAGNOSIS — R41841 Cognitive communication deficit: Secondary | ICD-10-CM | POA: Diagnosis not present

## 2016-03-31 DIAGNOSIS — I5042 Chronic combined systolic (congestive) and diastolic (congestive) heart failure: Secondary | ICD-10-CM | POA: Diagnosis not present

## 2016-03-31 DIAGNOSIS — R2681 Unsteadiness on feet: Secondary | ICD-10-CM | POA: Diagnosis not present

## 2016-03-31 DIAGNOSIS — N186 End stage renal disease: Secondary | ICD-10-CM | POA: Diagnosis not present

## 2016-03-31 DIAGNOSIS — I10 Essential (primary) hypertension: Secondary | ICD-10-CM | POA: Diagnosis not present

## 2016-03-31 DIAGNOSIS — R2689 Other abnormalities of gait and mobility: Secondary | ICD-10-CM | POA: Diagnosis not present

## 2016-03-31 DIAGNOSIS — R1319 Other dysphagia: Secondary | ICD-10-CM | POA: Diagnosis not present

## 2016-04-01 DIAGNOSIS — R41841 Cognitive communication deficit: Secondary | ICD-10-CM | POA: Diagnosis not present

## 2016-04-01 DIAGNOSIS — R2689 Other abnormalities of gait and mobility: Secondary | ICD-10-CM | POA: Diagnosis not present

## 2016-04-01 DIAGNOSIS — R1319 Other dysphagia: Secondary | ICD-10-CM | POA: Diagnosis not present

## 2016-04-01 DIAGNOSIS — I5042 Chronic combined systolic (congestive) and diastolic (congestive) heart failure: Secondary | ICD-10-CM | POA: Diagnosis not present

## 2016-04-01 DIAGNOSIS — R2681 Unsteadiness on feet: Secondary | ICD-10-CM | POA: Diagnosis not present

## 2016-04-01 DIAGNOSIS — N186 End stage renal disease: Secondary | ICD-10-CM | POA: Diagnosis not present

## 2016-04-02 DIAGNOSIS — N186 End stage renal disease: Secondary | ICD-10-CM | POA: Diagnosis not present

## 2016-04-02 DIAGNOSIS — R1319 Other dysphagia: Secondary | ICD-10-CM | POA: Diagnosis not present

## 2016-04-02 DIAGNOSIS — R2681 Unsteadiness on feet: Secondary | ICD-10-CM | POA: Diagnosis not present

## 2016-04-02 DIAGNOSIS — R2689 Other abnormalities of gait and mobility: Secondary | ICD-10-CM | POA: Diagnosis not present

## 2016-04-02 DIAGNOSIS — R41841 Cognitive communication deficit: Secondary | ICD-10-CM | POA: Diagnosis not present

## 2016-04-02 DIAGNOSIS — I5042 Chronic combined systolic (congestive) and diastolic (congestive) heart failure: Secondary | ICD-10-CM | POA: Diagnosis not present

## 2016-04-03 DIAGNOSIS — R2689 Other abnormalities of gait and mobility: Secondary | ICD-10-CM | POA: Diagnosis not present

## 2016-04-03 DIAGNOSIS — R41841 Cognitive communication deficit: Secondary | ICD-10-CM | POA: Diagnosis not present

## 2016-04-03 DIAGNOSIS — N186 End stage renal disease: Secondary | ICD-10-CM | POA: Diagnosis not present

## 2016-04-03 DIAGNOSIS — R2681 Unsteadiness on feet: Secondary | ICD-10-CM | POA: Diagnosis not present

## 2016-04-03 DIAGNOSIS — I5042 Chronic combined systolic (congestive) and diastolic (congestive) heart failure: Secondary | ICD-10-CM | POA: Diagnosis not present

## 2016-04-03 DIAGNOSIS — R1319 Other dysphagia: Secondary | ICD-10-CM | POA: Diagnosis not present

## 2016-04-04 DIAGNOSIS — I5042 Chronic combined systolic (congestive) and diastolic (congestive) heart failure: Secondary | ICD-10-CM | POA: Diagnosis not present

## 2016-04-04 DIAGNOSIS — R2689 Other abnormalities of gait and mobility: Secondary | ICD-10-CM | POA: Diagnosis not present

## 2016-04-04 DIAGNOSIS — R2681 Unsteadiness on feet: Secondary | ICD-10-CM | POA: Diagnosis not present

## 2016-04-04 DIAGNOSIS — R1319 Other dysphagia: Secondary | ICD-10-CM | POA: Diagnosis not present

## 2016-04-04 DIAGNOSIS — R41841 Cognitive communication deficit: Secondary | ICD-10-CM | POA: Diagnosis not present

## 2016-04-04 DIAGNOSIS — N186 End stage renal disease: Secondary | ICD-10-CM | POA: Diagnosis not present

## 2016-04-05 DIAGNOSIS — I5042 Chronic combined systolic (congestive) and diastolic (congestive) heart failure: Secondary | ICD-10-CM | POA: Diagnosis not present

## 2016-04-05 DIAGNOSIS — R1319 Other dysphagia: Secondary | ICD-10-CM | POA: Diagnosis not present

## 2016-04-05 DIAGNOSIS — R2681 Unsteadiness on feet: Secondary | ICD-10-CM | POA: Diagnosis not present

## 2016-04-05 DIAGNOSIS — R41841 Cognitive communication deficit: Secondary | ICD-10-CM | POA: Diagnosis not present

## 2016-04-05 DIAGNOSIS — R2689 Other abnormalities of gait and mobility: Secondary | ICD-10-CM | POA: Diagnosis not present

## 2016-04-05 DIAGNOSIS — N186 End stage renal disease: Secondary | ICD-10-CM | POA: Diagnosis not present

## 2016-04-07 DIAGNOSIS — I5042 Chronic combined systolic (congestive) and diastolic (congestive) heart failure: Secondary | ICD-10-CM | POA: Diagnosis not present

## 2016-04-07 DIAGNOSIS — R2681 Unsteadiness on feet: Secondary | ICD-10-CM | POA: Diagnosis not present

## 2016-04-07 DIAGNOSIS — R41841 Cognitive communication deficit: Secondary | ICD-10-CM | POA: Diagnosis not present

## 2016-04-07 DIAGNOSIS — R1319 Other dysphagia: Secondary | ICD-10-CM | POA: Diagnosis not present

## 2016-04-07 DIAGNOSIS — N186 End stage renal disease: Secondary | ICD-10-CM | POA: Diagnosis not present

## 2016-04-07 DIAGNOSIS — R2689 Other abnormalities of gait and mobility: Secondary | ICD-10-CM | POA: Diagnosis not present

## 2016-04-08 DIAGNOSIS — R1319 Other dysphagia: Secondary | ICD-10-CM | POA: Diagnosis not present

## 2016-04-08 DIAGNOSIS — I5042 Chronic combined systolic (congestive) and diastolic (congestive) heart failure: Secondary | ICD-10-CM | POA: Diagnosis not present

## 2016-04-08 DIAGNOSIS — R41841 Cognitive communication deficit: Secondary | ICD-10-CM | POA: Diagnosis not present

## 2016-04-08 DIAGNOSIS — R2689 Other abnormalities of gait and mobility: Secondary | ICD-10-CM | POA: Diagnosis not present

## 2016-04-08 DIAGNOSIS — N186 End stage renal disease: Secondary | ICD-10-CM | POA: Diagnosis not present

## 2016-04-08 DIAGNOSIS — R2681 Unsteadiness on feet: Secondary | ICD-10-CM | POA: Diagnosis not present

## 2016-04-09 DIAGNOSIS — R41841 Cognitive communication deficit: Secondary | ICD-10-CM | POA: Diagnosis not present

## 2016-04-09 DIAGNOSIS — R2689 Other abnormalities of gait and mobility: Secondary | ICD-10-CM | POA: Diagnosis not present

## 2016-04-09 DIAGNOSIS — R1319 Other dysphagia: Secondary | ICD-10-CM | POA: Diagnosis not present

## 2016-04-09 DIAGNOSIS — R2681 Unsteadiness on feet: Secondary | ICD-10-CM | POA: Diagnosis not present

## 2016-04-09 DIAGNOSIS — N186 End stage renal disease: Secondary | ICD-10-CM | POA: Diagnosis not present

## 2016-04-09 DIAGNOSIS — I5042 Chronic combined systolic (congestive) and diastolic (congestive) heart failure: Secondary | ICD-10-CM | POA: Diagnosis not present

## 2016-04-10 DIAGNOSIS — R2681 Unsteadiness on feet: Secondary | ICD-10-CM | POA: Diagnosis not present

## 2016-04-10 DIAGNOSIS — R1319 Other dysphagia: Secondary | ICD-10-CM | POA: Diagnosis not present

## 2016-04-10 DIAGNOSIS — R41841 Cognitive communication deficit: Secondary | ICD-10-CM | POA: Diagnosis not present

## 2016-04-10 DIAGNOSIS — I5042 Chronic combined systolic (congestive) and diastolic (congestive) heart failure: Secondary | ICD-10-CM | POA: Diagnosis not present

## 2016-04-10 DIAGNOSIS — N186 End stage renal disease: Secondary | ICD-10-CM | POA: Diagnosis not present

## 2016-04-10 DIAGNOSIS — R2689 Other abnormalities of gait and mobility: Secondary | ICD-10-CM | POA: Diagnosis not present

## 2016-04-11 DIAGNOSIS — N186 End stage renal disease: Secondary | ICD-10-CM | POA: Diagnosis not present

## 2016-04-11 DIAGNOSIS — R2681 Unsteadiness on feet: Secondary | ICD-10-CM | POA: Diagnosis not present

## 2016-04-11 DIAGNOSIS — R2689 Other abnormalities of gait and mobility: Secondary | ICD-10-CM | POA: Diagnosis not present

## 2016-04-11 DIAGNOSIS — R41841 Cognitive communication deficit: Secondary | ICD-10-CM | POA: Diagnosis not present

## 2016-04-11 DIAGNOSIS — R1319 Other dysphagia: Secondary | ICD-10-CM | POA: Diagnosis not present

## 2016-04-11 DIAGNOSIS — I5042 Chronic combined systolic (congestive) and diastolic (congestive) heart failure: Secondary | ICD-10-CM | POA: Diagnosis not present

## 2016-09-07 ENCOUNTER — Inpatient Hospital Stay (HOSPITAL_COMMUNITY): Payer: Medicare Other

## 2016-09-07 ENCOUNTER — Emergency Department (HOSPITAL_COMMUNITY): Payer: Medicare Other

## 2016-09-07 ENCOUNTER — Encounter (HOSPITAL_COMMUNITY): Payer: Self-pay | Admitting: *Deleted

## 2016-09-07 ENCOUNTER — Inpatient Hospital Stay (HOSPITAL_COMMUNITY)
Admission: EM | Admit: 2016-09-07 | Discharge: 2016-09-08 | DRG: 205 | Disposition: A | Discharge status: Home / Self Care | Payer: Medicare Other | Attending: Internal Medicine | Admitting: Internal Medicine

## 2016-09-07 DIAGNOSIS — E875 Hyperkalemia: Secondary | ICD-10-CM | POA: Diagnosis present

## 2016-09-07 DIAGNOSIS — N2581 Secondary hyperparathyroidism of renal origin: Secondary | ICD-10-CM | POA: Diagnosis present

## 2016-09-07 DIAGNOSIS — Z9889 Other specified postprocedural states: Secondary | ICD-10-CM | POA: Diagnosis not present

## 2016-09-07 DIAGNOSIS — Z992 Dependence on renal dialysis: Secondary | ICD-10-CM | POA: Diagnosis not present

## 2016-09-07 DIAGNOSIS — Z79899 Other long term (current) drug therapy: Secondary | ICD-10-CM | POA: Diagnosis not present

## 2016-09-07 DIAGNOSIS — I5042 Chronic combined systolic (congestive) and diastolic (congestive) heart failure: Secondary | ICD-10-CM | POA: Diagnosis not present

## 2016-09-07 DIAGNOSIS — Z7982 Long term (current) use of aspirin: Secondary | ICD-10-CM | POA: Diagnosis not present

## 2016-09-07 DIAGNOSIS — I252 Old myocardial infarction: Secondary | ICD-10-CM | POA: Diagnosis not present

## 2016-09-07 DIAGNOSIS — R0602 Shortness of breath: Secondary | ICD-10-CM | POA: Diagnosis not present

## 2016-09-07 DIAGNOSIS — R748 Abnormal levels of other serum enzymes: Secondary | ICD-10-CM | POA: Diagnosis present

## 2016-09-07 DIAGNOSIS — Z9119 Patient's noncompliance with other medical treatment and regimen: Secondary | ICD-10-CM

## 2016-09-07 DIAGNOSIS — I429 Cardiomyopathy, unspecified: Secondary | ICD-10-CM | POA: Diagnosis present

## 2016-09-07 DIAGNOSIS — Z8673 Personal history of transient ischemic attack (TIA), and cerebral infarction without residual deficits: Secondary | ICD-10-CM

## 2016-09-07 DIAGNOSIS — R34 Anuria and oliguria: Secondary | ICD-10-CM | POA: Diagnosis present

## 2016-09-07 DIAGNOSIS — R079 Chest pain, unspecified: Secondary | ICD-10-CM | POA: Diagnosis not present

## 2016-09-07 DIAGNOSIS — R0902 Hypoxemia: Secondary | ICD-10-CM | POA: Diagnosis not present

## 2016-09-07 DIAGNOSIS — N189 Chronic kidney disease, unspecified: Secondary | ICD-10-CM

## 2016-09-07 DIAGNOSIS — F17200 Nicotine dependence, unspecified, uncomplicated: Secondary | ICD-10-CM | POA: Diagnosis not present

## 2016-09-07 DIAGNOSIS — I69398 Other sequelae of cerebral infarction: Secondary | ICD-10-CM

## 2016-09-07 DIAGNOSIS — N186 End stage renal disease: Secondary | ICD-10-CM

## 2016-09-07 DIAGNOSIS — I120 Hypertensive chronic kidney disease with stage 5 chronic kidney disease or end stage renal disease: Secondary | ICD-10-CM | POA: Diagnosis not present

## 2016-09-07 DIAGNOSIS — F17210 Nicotine dependence, cigarettes, uncomplicated: Secondary | ICD-10-CM | POA: Diagnosis present

## 2016-09-07 DIAGNOSIS — I5043 Acute on chronic combined systolic (congestive) and diastolic (congestive) heart failure: Secondary | ICD-10-CM | POA: Diagnosis present

## 2016-09-07 DIAGNOSIS — D631 Anemia in chronic kidney disease: Secondary | ICD-10-CM | POA: Diagnosis not present

## 2016-09-07 DIAGNOSIS — Z8249 Family history of ischemic heart disease and other diseases of the circulatory system: Secondary | ICD-10-CM

## 2016-09-07 DIAGNOSIS — Z811 Family history of alcohol abuse and dependence: Secondary | ICD-10-CM

## 2016-09-07 DIAGNOSIS — I69320 Aphasia following cerebral infarction: Secondary | ICD-10-CM

## 2016-09-07 DIAGNOSIS — R0600 Dyspnea, unspecified: Secondary | ICD-10-CM | POA: Diagnosis not present

## 2016-09-07 DIAGNOSIS — H543 Unqualified visual loss, both eyes: Secondary | ICD-10-CM | POA: Diagnosis present

## 2016-09-07 DIAGNOSIS — I132 Hypertensive heart and chronic kidney disease with heart failure and with stage 5 chronic kidney disease, or end stage renal disease: Secondary | ICD-10-CM | POA: Diagnosis present

## 2016-09-07 DIAGNOSIS — Z720 Tobacco use: Secondary | ICD-10-CM | POA: Diagnosis present

## 2016-09-07 DIAGNOSIS — I2510 Atherosclerotic heart disease of native coronary artery without angina pectoris: Secondary | ICD-10-CM | POA: Diagnosis present

## 2016-09-07 DIAGNOSIS — Z833 Family history of diabetes mellitus: Secondary | ICD-10-CM

## 2016-09-07 DIAGNOSIS — I428 Other cardiomyopathies: Secondary | ICD-10-CM

## 2016-09-07 LAB — CBC WITH DIFFERENTIAL/PLATELET
Basophils Absolute: 0.1 10*3/uL (ref 0.0–0.1)
Basophils Relative: 1 %
EOS ABS: 0.6 10*3/uL (ref 0.0–0.7)
EOS PCT: 10 %
HCT: 34.9 % — ABNORMAL LOW (ref 39.0–52.0)
Hemoglobin: 11.5 g/dL — ABNORMAL LOW (ref 13.0–17.0)
LYMPHS ABS: 1.8 10*3/uL (ref 0.7–4.0)
Lymphocytes Relative: 30 %
MCH: 29.3 pg (ref 26.0–34.0)
MCHC: 33 g/dL (ref 30.0–36.0)
MCV: 89 fL (ref 78.0–100.0)
MONO ABS: 0.5 10*3/uL (ref 0.1–1.0)
Monocytes Relative: 8 %
Neutro Abs: 2.9 10*3/uL (ref 1.7–7.7)
Neutrophils Relative %: 51 %
PLATELETS: 154 10*3/uL (ref 150–400)
RBC: 3.92 MIL/uL — AB (ref 4.22–5.81)
RDW: 13.7 % (ref 11.5–15.5)
WBC: 5.8 10*3/uL (ref 4.0–10.5)

## 2016-09-07 LAB — BASIC METABOLIC PANEL
Anion gap: 14 (ref 5–15)
BUN: 36 mg/dL — AB (ref 6–20)
CHLORIDE: 100 mmol/L — AB (ref 101–111)
CO2: 25 mmol/L (ref 22–32)
CREATININE: 8.53 mg/dL — AB (ref 0.61–1.24)
Calcium: 9.8 mg/dL (ref 8.9–10.3)
GFR calc Af Amer: 6 mL/min — ABNORMAL LOW (ref >60)
GFR, EST NON AFRICAN AMERICAN: 6 mL/min — AB (ref >60)
GLUCOSE: 92 mg/dL (ref 65–99)
Potassium: 4.4 mmol/L (ref 3.5–5.1)
SODIUM: 139 mmol/L (ref 135–145)

## 2016-09-07 LAB — MRSA PCR SCREENING: MRSA by PCR: NEGATIVE

## 2016-09-07 LAB — I-STAT TROPONIN, ED: TROPONIN I, POC: 0.05 ng/mL (ref 0.00–0.08)

## 2016-09-07 LAB — TROPONIN I
Troponin I: 0.05 ng/mL (ref <0.03)
Troponin I: 0.06 ng/mL (ref <0.03)

## 2016-09-07 LAB — BRAIN NATRIURETIC PEPTIDE: B Natriuretic Peptide: 1684.3 pg/mL — ABNORMAL HIGH (ref 0.0–100.0)

## 2016-09-07 MED ORDER — CARVEDILOL 3.125 MG PO TABS
3.1250 mg | ORAL_TABLET | Freq: Two times a day (BID) | ORAL | Status: DC
  Administered 2016-09-07 – 2016-09-08 (×2): 3.125 mg via ORAL
  Filled 2016-09-07 (×3): qty 1

## 2016-09-07 MED ORDER — ATORVASTATIN CALCIUM 40 MG PO TABS: 40.0000 mg | ORAL_TABLET | Freq: Every day | ORAL | Status: DC

## 2016-09-07 MED ORDER — SODIUM CHLORIDE 0.9% FLUSH
3.0000 mL | Freq: Two times a day (BID) | INTRAVENOUS | Status: DC
  Administered 2016-09-07 – 2016-09-08 (×3): 3 mL via INTRAVENOUS

## 2016-09-07 MED ORDER — ASPIRIN EC 325 MG PO TBEC
325.0000 mg | DELAYED_RELEASE_TABLET | Freq: Every day | ORAL | Status: DC
  Administered 2016-09-08: 325 mg via ORAL
  Filled 2016-09-07: qty 1

## 2016-09-07 MED ORDER — CALCIUM ACETATE (PHOS BINDER) 667 MG PO CAPS
1334.0000 mg | ORAL_CAPSULE | Freq: Three times a day (TID) | ORAL | Status: DC
  Administered 2016-09-07 – 2016-09-08 (×3): 1334 mg via ORAL
  Filled 2016-09-07 (×4): qty 2

## 2016-09-07 MED ORDER — TECHNETIUM TO 99M ALBUMIN AGGREGATED
4.0600 | Freq: Once | INTRAVENOUS | Status: AC | PRN
  Administered 2016-09-07: 4 via INTRAVENOUS

## 2016-09-07 MED ORDER — HEPARIN SODIUM (PORCINE) 5000 UNIT/ML IJ SOLN
5000.0000 [IU] | Freq: Three times a day (TID) | INTRAMUSCULAR | Status: DC
  Administered 2016-09-07 – 2016-09-08 (×2): 5000 [IU] via SUBCUTANEOUS
  Filled 2016-09-07 (×2): qty 1

## 2016-09-07 MED ORDER — CLOPIDOGREL BISULFATE 75 MG PO TABS
75.0000 mg | ORAL_TABLET | Freq: Every day | ORAL | Status: DC
  Administered 2016-09-07 – 2016-09-08 (×2): 75 mg via ORAL
  Filled 2016-09-07 (×2): qty 1

## 2016-09-07 MED ORDER — TECHNETIUM TC 99M DIETHYLENETRIAME-PENTAACETIC ACID: 32.2000 | Freq: Once | INTRAVENOUS | Status: DC | PRN

## 2016-09-07 MED ORDER — MIDODRINE HCL 5 MG PO TABS
5.0000 mg | ORAL_TABLET | Freq: Three times a day (TID) | ORAL | Status: DC
Start: 2016-09-07 — End: 2016-09-07

## 2016-09-07 MED ORDER — CALCIUM ACETATE 667 MG PO CAPS: 1334.0000 mg | ORAL_CAPSULE | Freq: Three times a day (TID) | ORAL | Status: DC

## 2016-09-07 MED ORDER — OMEGA-3-ACID ETHYL ESTERS 1 G PO CAPS: 1.0000 g | ORAL_CAPSULE | Freq: Two times a day (BID) | ORAL | Status: DC

## 2016-09-07 MED ORDER — ISOSORBIDE MONONITRATE ER 30 MG PO TB24: 30.0000 mg | ORAL_TABLET | Freq: Every day | ORAL | Status: DC

## 2016-09-07 MED ORDER — SODIUM CHLORIDE 0.9% FLUSH: 3.0000 mL | Freq: Two times a day (BID) | INTRAVENOUS | Status: DC

## 2016-09-07 MED ORDER — SODIUM CHLORIDE 0.9% FLUSH: 3.0000 mL | INTRAVENOUS | Status: DC | PRN

## 2016-09-07 MED ORDER — GABAPENTIN 100 MG PO CAPS
100.0000 mg | ORAL_CAPSULE | Freq: Three times a day (TID) | ORAL | Status: DC
  Administered 2016-09-07 (×2): 100 mg via ORAL
  Filled 2016-09-07 (×2): qty 1

## 2016-09-07 MED ORDER — ASPIRIN 81 MG PO CHEW: 324.0000 mg | CHEWABLE_TABLET | Freq: Once | ORAL | Status: DC

## 2016-09-07 MED ORDER — SODIUM CHLORIDE 0.9 % IV SOLN: 250.0000 mL | INTRAVENOUS | Status: DC | PRN

## 2016-09-07 MED ORDER — CINACALCET HCL 30 MG PO TABS
60.0000 mg | ORAL_TABLET | Freq: Every day | ORAL | Status: DC
  Administered 2016-09-08: 60 mg via ORAL
  Filled 2016-09-07 (×2): qty 2

## 2016-09-07 NOTE — ED Notes (Signed)
Pt O2 sat dropped to 75% at rest when MD attempted to remove [place].

## 2016-09-07 NOTE — ED Provider Notes (Signed)
[facility] DEPT Provider Note   CSN: [id number] Arrival date & time: 09/07/16  0903     History   Chief Complaint Chief Complaint  Patient presents with  . Shortness of Breath    HPI BOLUWATIFE FLIGHT is a 70 y.o. male.  The history is provided by the patient.  Shortness of Breath  This is a new problem. The average episode lasts 1 hour. The problem occurs continuously.The current episode started 1 to 2 hours ago. The problem has been gradually improving (en route with ambulance and with oxygen on arrival here). Associated symptoms include chest pain (sharp left sided, improving). Pertinent negatives include no fever, no cough, no sputum production, no abdominal pain and no leg swelling. Risk factors: ESRD last dialysis 2 days ago, due tomorrow. He has tried nothing for the symptoms. Associated medical issues include heart failure.    Past Medical History:  Diagnosis Date  . Anxiety   . Arthritis   . Blindness   . Cardiomyopathy   . Chest pain    heart cath 2015, no intervention (LCX 60-80%) otherwise nonobst disease  . 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 ESRD on hemodialysis ([name])    started HD 2014-15  . Headache(784.0)   . HTN (hypertension)   . Myocardial infarction   . Shortness of breath     Patient Active Problem List   Diagnosis Date Noted  . Essential hypertension 03/08/2016  . Orthostatic hypotension 01/12/2016  . Hyperlipidemia 01/12/2016  . Chest pain at rest   . ESRD (end stage renal disease) ([place])   . Cough   . Weakness generalized 12/26/2015  . Hypertensive heart disease with CHF (congestive heart failure) ([place])   . Chest pain 08/12/2015  . Malignant hypertension 08/12/2015  . Carotid stenosis   . TIA (transient ischemic attack) 04/04/2015  . Weakness 04/03/2015  . Stroke ([place]) 04/03/2015  . Abdominal aortic aneurysm ([place]) 03/06/2015  . Generalized weakness 09/08/2014  . Acute encephalopathy  09/07/2014  . CAD (coronary artery disease) 08/08/2014  . NSTEMI (non-ST elevated myocardial infarction) - Presumed Type II in setting of HTN Emergency 07/07/2014  . Hypertensive emergency 07/05/2014  . Acute on chronic combined systolic and diastolic CHF (congestive heart failure) ([place]) 07/05/2014  . Protein-calorie malnutrition, severe ([place]) 05/20/2014  . Hypokalemia 05/19/2014  . Syncope and collapse 05/19/2014  . ESRD on hemodialysis ([place]) 05/19/2014  . Anemia of renal disease 05/19/2014  . Coronary artery disease, non-occlusive: Cath 2013 - Moderate D1 & RCA disease 09/28/2012  . Anemia 09/27/2012  . Secondary hyperparathyroidism ([place]) 09/27/2012  . Non compliance with medical treatment 09/27/2012  . Chronic combined systolic and diastolic CHF (congestive heart failure) ([place]) 10/10/2011  . A-fib ([place]) 10/08/2011  . History of stroke 10/07/2011  . Cocaine abuse 10/07/2011  . Severe sinus bradycardia 10/07/2011  . Nonischemic cardiomyopathy ([place]) 09/18/2011  . Tobacco use disorder 08/07/2011  . Bruit 08/07/2011    Past Surgical History:  Procedure Laterality Date  . INSERTION OF DIALYSIS CATHETER  09/21/2012   Procedure: INSERTION OF DIALYSIS CATHETER;  Surgeon: Angelia Mould, MD;  Location: [facility];  Service: Vascular;  Laterality: N/A;  Right Internal Jugular Placement  . LEFT HEART CATHETERIZATION WITH CORONARY ANGIOGRAM N/A 09/28/2012   Procedure: LEFT HEART CATHETERIZATION WITH CORONARY ANGIOGRAM;  Surgeon: Sherren Mocha, MD;  Location: [facility] CATH LAB;  Service: Cardiovascular;  Laterality: N/A;  . LEFT HEART CATHETERIZATION WITH CORONARY  ANGIOGRAM N/A 07/07/2014   Procedure: LEFT HEART CATHETERIZATION WITH CORONARY ANGIOGRAM;  Surgeon: Leonie Man, MD;  Location: [facility] CATH LAB;  Service: Cardiovascular;  Laterality: N/A;  . right hand    . TEE WITHOUT CARDIOVERSION  10/09/2011   Procedure: TRANSESOPHAGEAL ECHOCARDIOGRAM (TEE);  Surgeon: Lelon Perla, MD;  Location:  [facility] ENDOSCOPY;  Service: Cardiovascular;  Laterality: N/A;       Home Medications    Prior to Admission medications   Medication Sig Start Date End Date Taking? Authorizing Provider  aspirin EC 325 MG EC tablet Take 1 tablet (325 mg total) by mouth daily. 03/11/16   Geradine Girt, DO  atorvastatin (LIPITOR) 40 MG tablet Take 1 tablet (40 mg total) by mouth daily at 6 PM. Patient not taking: Reported on 03/10/2016 08/18/15   Geradine Girt, DO  B Complex-C-Folic Acid (DIALYVITE TABLET) TABS Take 1 tablet by mouth daily. Patient not taking: Reported on 03/10/2016 09/28/12   Sorin June Leap, MD  calcium acetate (PHOSLO) 667 MG capsule Take 1,334 mg by mouth 3 (three) times daily with meals.     Historical Provider, MD  carvedilol (COREG) 3.125 MG tablet Take 1 tablet (3.125 mg total) by mouth 2 (two) times daily with a meal. 03/11/16   Geradine Girt, DO  cinacalcet (SENSIPAR) 30 MG tablet Take 2 tablets (60 mg total) by mouth daily with breakfast. 03/11/16   Geradine Girt, DO  clopidogrel (PLAVIX) 75 MG tablet Take 1 tablet (75 mg total) by mouth daily. 04/05/15   Barton Dubois, MD  gabapentin (NEURONTIN) 100 MG capsule Take 1 capsule (100 mg total) by mouth 3 (three) times daily. 04/05/15   Barton Dubois, MD  isosorbide mononitrate (IMDUR) 30 MG 24 hr tablet Take 1 tablet (30 mg total) by mouth daily. 03/12/16   Lavina Hamman, MD  midodrine (PROAMATINE) 5 MG tablet Take 1 tablet (5 mg total) by mouth 3 (three) times daily with meals. 03/11/16   Geradine Girt, DO  omega-3 acid ethyl esters (LOVAZA) 1 G capsule Take 1 capsule (1 g total) by mouth 2 (two) times daily. 04/05/15   Barton Dubois, MD    Family History Family History  Problem Relation Age of Onset  . Heart attack Mother     MI in her 48s  . Diabetes Mother   . Anesthesia problems Neg Hx   . Hypotension Neg Hx   . Malignant hyperthermia Neg Hx   . Pseudochol deficiency Neg Hx   . Alcohol abuse Father     Social History Social History    Substance Use Topics  . Smoking status: Current Some Day Smoker    Packs/day: 0.50    Years: 50.00    Types: Cigarettes  . Smokeless tobacco: Never Used  . Alcohol use No     Comment: Occasional     Allergies   Review of patient's allergies indicates no known allergies.   Review of Systems Review of Systems  Constitutional: Negative for fever.  Respiratory: Positive for shortness of breath. Negative for cough and sputum production.   Cardiovascular: Positive for chest pain (sharp left sided, improving). Negative for leg swelling.  Gastrointestinal: Negative for abdominal pain.  All other systems reviewed and are negative.    Physical Exam Updated Vital Signs Temp 98.9 F (37.2 C) (Oral)   SpO2 (!) 79%   Physical Exam  Constitutional: He is oriented to person, place, and time. He appears well-developed and well-nourished. No distress.  HENT:  Head: Normocephalic and atraumatic.  Nose: Nose normal.  Eyes: Conjunctivae are normal.  Neck: Neck supple. No tracheal deviation present.  Cardiovascular: Normal rate, regular rhythm and normal heart sounds.   No murmur heard. Pulmonary/Chest: Effort normal. No respiratory distress. He has rales (bibasilar). He exhibits no tenderness.  Abdominal: Soft. He exhibits no distension. There is no tenderness.  Neurological: He is alert and oriented to person, place, and time.  Skin: Skin is warm and dry. Capillary refill takes less than 2 seconds. No pallor.  Psychiatric: He has a normal mood and affect.     ED Treatments / Results  Labs (all labs ordered are listed, but only abnormal results are displayed) Labs Reviewed  BASIC METABOLIC PANEL - Abnormal; Notable for the following:       Result Value   Chloride 100 (*)    BUN 36 (*)    Creatinine, Ser 8.53 (*)    GFR calc non Af Amer 6 (*)    GFR calc Af Amer 6 (*)    All other components within normal limits  CBC WITH DIFFERENTIAL/PLATELET - Abnormal; Notable for the  following:    RBC 3.92 (*)    Hemoglobin 11.5 (*)    HCT 34.9 (*)    All other components within normal limits  BRAIN NATRIURETIC PEPTIDE - Abnormal; Notable for the following:    B Natriuretic Peptide 1,684.3 (*)    All other components within normal limits  TROPONIN I - Abnormal; Notable for the following:    Troponin I 0.05 (*)    All other components within normal limits  TROPONIN I - Abnormal; Notable for the following:    Troponin I 0.06 (*)    All other components within normal limits  TROPONIN I - Abnormal; Notable for the following:    Troponin I 0.05 (*)    All other components within normal limits  HEMOGLOBIN A1C - Abnormal; Notable for the following:    Hgb A1c MFr Bld 4.6 (*)    All other components within normal limits  COMPREHENSIVE METABOLIC PANEL - Abnormal; Notable for the following:    Potassium 5.2 (*)    BUN 48 (*)    Creatinine, Ser 10.06 (*)    Total Protein 6.2 (*)    Albumin 3.2 (*)    AST 10 (*)    ALT 11 (*)    GFR calc non Af Amer 5 (*)    GFR calc Af Amer 5 (*)    All other components within normal limits  CBC - Abnormal; Notable for the following:    RBC 3.51 (*)    Hemoglobin 10.3 (*)    HCT 31.2 (*)    All other components within normal limits  MRSA PCR SCREENING  I-STAT TROPOININ, ED    EKG  EKG Interpretation None       Radiology Dg Chest 2 View  Result Date: 09/07/2016 CLINICAL DATA:  Left-sided chest pain and shortness of breath EXAM: CHEST  2 VIEW COMPARISON:  March 08, 2016 FINDINGS: Lungs are clear. Heart size and pulmonary vascularity are normal. No adenopathy. There is atherosclerotic calcification in the aorta. There is mild degenerative change in the thoracic spine. IMPRESSION: Aortic atherosclerosis.  No edema or consolidation. Electronically Signed   By: Lowella Grip III M.D.   On: 09/07/2016 10:10   Nm Pulmonary Perf And Vent  Result Date: 09/07/2016 CLINICAL DATA:  Hypoxemia EXAM: NUCLEAR MEDICINE VENTILATION -  PERFUSION LUNG SCAN TECHNIQUE: Ventilation  images were obtained in multiple projections using inhaled aerosol Tc-86m DTPA. Perfusion images were obtained in multiple projections after intravenous injection of Tc-6m MAA. RADIOPHARMACEUTICALS:  32.2 mCi Technetium-23m DTPA aerosol inhalation and 4.06 mCi Technetium-63m MAA IV COMPARISON:  Chest radiography same day FINDINGS: Ventilation: No defects or air trapping. Perfusion: No defects.  Normal perfusion. IMPRESSION: Normal VQ scan. Electronically Signed   By: Nelson Chimes M.D.   On: 09/07/2016 13:10    Procedures Procedures (including critical care time)  Medications Ordered in ED Medications  aspirin EC tablet 325 mg (325 mg Oral Given 09/08/16 1357)  carvedilol (COREG) tablet 3.125 mg (3.125 mg Oral Given 09/08/16 0738)  cinacalcet (SENSIPAR) tablet 60 mg (60 mg Oral Given 09/08/16 0738)  clopidogrel (PLAVIX) tablet 75 mg (75 mg Oral Given 09/08/16 1357)  gabapentin (NEURONTIN) capsule 100 mg (100 mg Oral Not Given 09/08/16 1330)  heparin injection 5,000 Units (5,000 Units Subcutaneous Not Given 09/08/16 1400)  sodium chloride flush (NS) 0.9 % injection 3 mL (3 mLs Intravenous Given 09/08/16 1348)  sodium chloride flush (NS) 0.9 % injection 3 mL (0 mLs Intravenous Duplicate 20/2/33 4356)  sodium chloride flush (NS) 0.9 % injection 3 mL (not administered)  0.9 %  sodium chloride infusion (not administered)  technetium TC 55M diethylenetriame-pentaacetic acid (DTPA) injection 86.1 millicurie (not administered)  calcium acetate (PHOSLO) capsule 1,334 mg (1,334 mg Oral Given 09/08/16 1357)  technetium albumin aggregated (MAA) injection solution 4 millicurie (4 millicuries Intravenous Contrast Given 09/07/16 1234)     Initial Impression / Assessment and Plan / ED Course  I have reviewed the triage vital signs and the nursing notes.  Pertinent labs & imaging results that were available during my care of the patient were reviewed by me and considered  in my medical decision making (see chart for details).  Clinical Course    70 y.o. male presents with left sided chest pain and acut eonset shortness of breath. Newly hypoxemic into the 70s here with symptoms, improved with oxygen. No persistent chest pain, EKG not c/w ACS but high risk. High concern for PE but Pt is ESRD and still makes urine so will not perform contrasted study. Admitted for monitoring and V/Q scan. BNP elevated but does not appear volume overloaded and lungs clear on CXR. Hospitalist was consulted for admission and will see the patient in the emergency department. Consulted nephrology for dialysis tomorrow.  Final Clinical Impressions(s) / ED Diagnoses   Final diagnoses:  Hypoxemia    New Prescriptions New Prescriptions   No medications on file     Leo Grosser, MD 09/08/16 1840

## 2016-09-07 NOTE — Progress Notes (Signed)
Pt brought to the floor from ED in stable condition. Vitals taken. Oxygen saturations are 95- 100% on room air. Telemetry in place. Important safety instructions relating to hospitalization reviewed with patient. Patient verbalized understanding.

## 2016-09-07 NOTE — ED Triage Notes (Signed)
Pt arrives from home with c/o SOB X1.5 hours. Pt receives dialysis M,W,F and has missed no tx. Pt received 324 ASA pta.

## 2016-09-07 NOTE — ED Notes (Signed)
Patient transported to X-ray 

## 2016-09-07 NOTE — ED Notes (Signed)
Pt transported [facility].

## 2016-09-07 NOTE — H&P (Addendum)
[facility] History and Physical  Trevor [name] ZHG:[id number] DOB: Dec 30, 1945 DOA: 09/07/2016  Referring physician: ER PCP: [name], MD  (renal)  Chief Complaint: sob  HPI: Trevor [name] is a 70 y.o. male admitting history of end-stage renal disease on hemodialysis Monday, Wednesday and Friday, last dialysis since session was on Friday. He also has a history of cardiomyopathy, chronic combined congestive heart failure,  hypertension, hyperlipidemia, prior history of myocardial infarction, presents to the ED secondary to complaints of severe dyspnea on exertion when he was trying to make his bed this morning.  Upon arrival to the ED, he had sats of 72% on room air. He was placed on 3 L nasal cannula and his sats immediately improved, less dyspneic, less short of breath. He feels much more comfortable now in the ED, he denies fevers or chills, nausea, vomiting, chest pain, palpitations, diarrhea, is oliguric.  Initially this morning, prior to the EMS arrival had transient left sided chest pains with shortness of breath, but now that has resolved as well. Denies pleuritic pain at this time as well.    Review of Systems:  Per HPI, o/w all systems reviewed and negative.  Past Medical History:  Diagnosis Date  . Anxiety   . Arthritis   . Blindness   . Cardiomyopathy   . Chest pain    heart cath 2015, no intervention (LCX 60-80%) otherwise nonobst disease  . 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 ESRD on hemodialysis ([place])    started HD 2014-15  . Headache(784.0)   . HTN (hypertension)   . Myocardial infarction   . Shortness of breath    Past Surgical History:  Procedure Laterality Date  . INSERTION OF DIALYSIS CATHETER  09/21/2012   Procedure: INSERTION OF DIALYSIS CATHETER;  Surgeon: Angelia Mould, MD;  Location: [facility];  Service: Vascular;  Laterality: N/A;  Right Internal Jugular Placement  . LEFT HEART  CATHETERIZATION WITH CORONARY ANGIOGRAM N/A 09/28/2012   Procedure: LEFT HEART CATHETERIZATION WITH CORONARY ANGIOGRAM;  Surgeon: Sherren Mocha, MD;  Location: [facility] CATH LAB;  Service: Cardiovascular;  Laterality: N/A;  . LEFT HEART CATHETERIZATION WITH CORONARY ANGIOGRAM N/A 07/07/2014   Procedure: LEFT HEART CATHETERIZATION WITH CORONARY ANGIOGRAM;  Surgeon: Leonie Man, MD;  Location: [facility] CATH LAB;  Service: Cardiovascular;  Laterality: N/A;  . right hand    . TEE WITHOUT CARDIOVERSION  10/09/2011   Procedure: TRANSESOPHAGEAL ECHOCARDIOGRAM (TEE);  Surgeon: Lelon Perla, MD;  Location: [facility] ENDOSCOPY;  Service: Cardiovascular;  Laterality: N/A;   Social History:  reports that he has been smoking Cigarettes.  He has a 25.00 pack-year smoking history. He has never used smokeless tobacco. He reports that he does not drink alcohol or use drugs.  No Known Allergies  Family History  Problem Relation Age of Onset  . Heart attack Mother     MI in her 64s  . Diabetes Mother   . Alcohol abuse Father   . Anesthesia problems Neg Hx   . Hypotension Neg Hx   . Malignant hyperthermia Neg Hx   . Pseudochol deficiency Neg Hx    Prior to Admission medications   Medication Sig Start Date End Date Taking? Authorizing Provider  gabapentin (NEURONTIN) 100 MG capsule Take 1 capsule (100 mg total) by mouth 3 (three) times daily. 04/05/15  Yes Barton Dubois, MD  aspirin EC 325 MG EC tablet Take 1 tablet (325  mg total) by mouth daily. 03/11/16   Geradine Girt, DO  atorvastatin (LIPITOR) 40 MG tablet Take 1 tablet (40 mg total) by mouth daily at 6 PM. Patient not taking: Reported on 03/10/2016 08/18/15   Geradine Girt, DO  B Complex-C-Folic Acid (DIALYVITE TABLET) TABS Take 1 tablet by mouth daily. Patient not taking: Reported on 03/10/2016 09/28/12   Sorin June Leap, MD  calcium acetate (PHOSLO) 667 MG capsule Take 1,334 mg by mouth 3 (three) times daily with meals.     Historical Provider, MD  carvedilol (COREG)  3.125 MG tablet Take 1 tablet (3.125 mg total) by mouth 2 (two) times daily with a meal. 03/11/16   Geradine Girt, DO  cinacalcet (SENSIPAR) 30 MG tablet Take 2 tablets (60 mg total) by mouth daily with breakfast. 03/11/16   Geradine Girt, DO  clopidogrel (PLAVIX) 75 MG tablet Take 1 tablet (75 mg total) by mouth daily. 04/05/15   Barton Dubois, MD  isosorbide mononitrate (IMDUR) 30 MG 24 hr tablet Take 1 tablet (30 mg total) by mouth daily. 03/12/16   Lavina Hamman, MD  midodrine (PROAMATINE) 5 MG tablet Take 1 tablet (5 mg total) by mouth 3 (three) times daily with meals. 03/11/16   Geradine Girt, DO  omega-3 acid ethyl esters (LOVAZA) 1 G capsule Take 1 capsule (1 g total) by mouth 2 (two) times daily. 04/05/15   Barton Dubois, MD   Physical Exam: Vitals:   09/07/16 0925 09/07/16 0930 09/07/16 1030 09/07/16 1045  BP: 125/67 118/64 135/68 136/67  Pulse: 62 61 61 61  Resp: 19 19 17 12   Temp:      TempSrc:      SpO2: 98% 100% 96% (!) 89%    Wt Readings from Last 3 Encounters:  03/12/16 59.4 kg (130 lb 15.3 oz)  02/06/16 60.3 kg (133 lb)  01/03/16 60.5 kg (133 lb 6.4 oz)    General:  Appears calm and comfortable, pleasant, NAD, AAOx3, on N/c currently. Speaks in full sentences. Eyes: PERRL, normal lids, irises & conjunctiva ENT: grossly normal hearing, lips & tongue, mmm Neck: no LAD Cardiovascular: RRR, no m/r/g. No LE edema. Telemetry: SR, no arrhythmias  Respiratory: CTA bilaterally, no w/r/r. Normal respiratory effort.  Nttp.  Abdomen: soft, ntnd Skin: no rash or induration seen on limited exam Musculoskeletal: grossly normal tone BUE/BLE Psychiatric: grossly normal mood and affect, speech fluent and appropriate Neurologic: grossly non-focal.          Labs on Admission:  Basic Metabolic Panel:  Recent Labs Lab 09/07/16 1009  NA 139  K 4.4  CL 100*  CO2 25  GLUCOSE 92  BUN 36*  CREATININE 8.53*  CALCIUM 9.8   Liver Function Tests: No results for input(s): AST,  ALT, ALKPHOS, BILITOT, PROT, ALBUMIN in the last 168 hours. No results for input(s): LIPASE, AMYLASE in the last 168 hours. No results for input(s): AMMONIA in the last 168 hours. CBC:  Recent Labs Lab 09/07/16 1009  WBC 5.8  NEUTROABS 2.9  HGB 11.5*  HCT 34.9*  MCV 89.0  PLT 154   Cardiac Enzymes: No results for input(s): CKTOTAL, CKMB, CKMBINDEX, TROPONINI in the last 168 hours.  BNP (last 3 results)  Recent Labs  09/07/16 1010  BNP 1,684.3*    ProBNP (last 3 results) No results for input(s): PROBNP in the last 8760 hours.  CBG: No results for input(s): GLUCAP in the last 168 hours.  Radiological Exams on Admission: Dg Chest  2 View  Result Date: 09/07/2016 CLINICAL DATA:  Left-sided chest pain and shortness of breath EXAM: CHEST  2 VIEW COMPARISON:  March 08, 2016 FINDINGS: Lungs are clear. Heart size and pulmonary vascularity are normal. No adenopathy. There is atherosclerotic calcification in the aorta. There is mild degenerative change in the thoracic spine. IMPRESSION: Aortic atherosclerosis.  No edema or consolidation. Electronically Signed   By: Lowella Grip III M.D.   On: 09/07/2016 10:10    EKG: Independently reviewed.   EKG Interpretation  Date/Time:  Sunday September 07 2016 09:17:27 EDT Ventricular Rate:  61 PR Interval:    QRS Duration: 100 QT Interval:  430 QTC Calculation: 434 R Axis:   24 Text Interpretation:  Sinus rhythm LVH with secondary repolarization abnormality Since last tracing rate slower Otherwise no significant change Confirmed by KNOTT MD, Quillian Quince ([id number]) on 09/07/2016 9:29:34 AM Also confirmed by KNOTT MD, DANIEL [phone]), editor [place], Joelene Millin [id number])  on 09/07/2016 10:29:57 AM      Echo 12/28/15 Study Conclusions  - Left ventricle: The cavity size was normal. There was moderate   concentric hypertrophy. Systolic function was mildly reduced. The   estimated ejection fraction was in the range of 45% to 50%. Mild   diffuse  hypokinesis with no identifiable regional variations.   Doppler parameters are consistent with abnormal left ventricular   relaxation (grade 1 diastolic dysfunction). - Mitral valve: Calcified annulus. - Left atrium: The atrium was mildly dilated.   Assessment/Plan Principal Problem:   Hypoxemia Active Problems:   Chronic combined systolic and diastolic CHF (congestive heart failure) (HCC)   Coronary artery disease, non-occlusive: Cath 2013 - Moderate D1 & RCA disease   ESRD on hemodialysis (HCC)   Tobacco use disorder   Nonischemic cardiomyopathy (HCC)   History of stroke   Secondary hyperparathyroidism ([place])   Anemia of renal disease   1. Acute hypoxemia, etiology unclear.  The patient appears euvolemic currently, does not appear vol overloaded. Etiology unclear.   - bedside Echocardiogram showed no right heart strain. Pt is not tachycardic or tachypneic currently, no pleuritic pain on exam.  I'm not certain if PE is a possibility, but pt much more comfortable since O2 administered  - oliguric  - admit telemetry,   - order VQ scan, dw w/ nuclear med, they are able to do so now.  2. Combined chf, euvolemic appearing.  - resumed his home meds, not on diurectics due to HD  - has elavated bnp (1,680 currently), but appears chronic due to esrd, last bnp 2/16 >4500  3. Cad, stable, does not appear like ACS  - asa 325, plavix, imdur, midodrine, coreg 3.125bid, atorvastatin 40 qhs (pt was not taking prior to admission)   4. Initial chest pain at home while hypoxic/doe  - may certainly be heart strain due to severe hypoxemia  - does not appear like ACS currently  - serial ekg  5. Esrd, on HD, m/w/f  - followed by DR  [name], MD  (renal)  - as ED to call for set up of HD tomorw.  - no emergent HD needs noted currently  - k 4.4 currently  6. tob abuse  - recd complete cessation   7. Chronic anemia, in setting of esrd  8. 2nd hyperparathyroidism, in setting of  esrd.  Renal was c/s by ED for HD tomorw.    Code Status: full DVT Prophylaxis: hep [place] tid Family Communication: no family at bedside Disposition Plan: inpt tele  Time  spent: 58mins  Maren Reamer MD., MBA/MHA [facility] Pager [phone]  Addendum/ neg vq scan

## 2016-09-07 NOTE — ED Notes (Signed)
O2 increased to 4L [place] rt O2 sat on 3L [place] at 84% HOB raised.

## 2016-09-08 DIAGNOSIS — N186 End stage renal disease: Secondary | ICD-10-CM

## 2016-09-08 DIAGNOSIS — D631 Anemia in chronic kidney disease: Secondary | ICD-10-CM

## 2016-09-08 DIAGNOSIS — I2510 Atherosclerotic heart disease of native coronary artery without angina pectoris: Secondary | ICD-10-CM

## 2016-09-08 DIAGNOSIS — I5042 Chronic combined systolic (congestive) and diastolic (congestive) heart failure: Secondary | ICD-10-CM

## 2016-09-08 DIAGNOSIS — F17200 Nicotine dependence, unspecified, uncomplicated: Secondary | ICD-10-CM

## 2016-09-08 DIAGNOSIS — Z8673 Personal history of transient ischemic attack (TIA), and cerebral infarction without residual deficits: Secondary | ICD-10-CM

## 2016-09-08 DIAGNOSIS — Z992 Dependence on renal dialysis: Secondary | ICD-10-CM

## 2016-09-08 LAB — CBC
HCT: 31.2 % — ABNORMAL LOW (ref 39.0–52.0)
HEMOGLOBIN: 10.3 g/dL — AB (ref 13.0–17.0)
MCH: 29.3 pg (ref 26.0–34.0)
MCHC: 33 g/dL (ref 30.0–36.0)
MCV: 88.9 fL (ref 78.0–100.0)
Platelets: 150 10*3/uL (ref 150–400)
RBC: 3.51 MIL/uL — AB (ref 4.22–5.81)
RDW: 13.8 % (ref 11.5–15.5)
WBC: 5.7 10*3/uL (ref 4.0–10.5)

## 2016-09-08 LAB — COMPREHENSIVE METABOLIC PANEL
ALK PHOS: 55 U/L (ref 38–126)
ALT: 11 U/L — ABNORMAL LOW (ref 17–63)
ANION GAP: 13 (ref 5–15)
AST: 10 U/L — ABNORMAL LOW (ref 15–41)
Albumin: 3.2 g/dL — ABNORMAL LOW (ref 3.5–5.0)
BUN: 48 mg/dL — ABNORMAL HIGH (ref 6–20)
CALCIUM: 9.3 mg/dL (ref 8.9–10.3)
CO2: 23 mmol/L (ref 22–32)
Chloride: 101 mmol/L (ref 101–111)
Creatinine, Ser: 10.06 mg/dL — ABNORMAL HIGH (ref 0.61–1.24)
GFR, EST AFRICAN AMERICAN: 5 mL/min — AB (ref >60)
GFR, EST NON AFRICAN AMERICAN: 5 mL/min — AB (ref >60)
Glucose, Bld: 73 mg/dL (ref 65–99)
Potassium: 5.2 mmol/L — ABNORMAL HIGH (ref 3.5–5.1)
SODIUM: 137 mmol/L (ref 135–145)
TOTAL PROTEIN: 6.2 g/dL — AB (ref 6.5–8.1)
Total Bilirubin: 0.5 mg/dL (ref 0.3–1.2)

## 2016-09-08 LAB — TROPONIN I: TROPONIN I: 0.05 ng/mL — AB (ref <0.03)

## 2016-09-08 LAB — HEMOGLOBIN A1C
Hgb A1c MFr Bld: 4.6 % — ABNORMAL LOW (ref 4.8–5.6)
MEAN PLASMA GLUCOSE: 85 mg/dL

## 2016-09-08 MED ORDER — LIDOCAINE HCL (PF) 1 % IJ SOLN: 5.0000 mL | INTRAMUSCULAR | Status: DC | PRN

## 2016-09-08 MED ORDER — LIDOCAINE-PRILOCAINE 2.5-2.5 % EX CREA: 1.0000 "application " | TOPICAL_CREAM | CUTANEOUS | Status: DC | PRN

## 2016-09-08 MED ORDER — PENTAFLUOROPROP-TETRAFLUOROETH EX AERO: 1.0000 "application " | INHALATION_SPRAY | CUTANEOUS | Status: DC | PRN

## 2016-09-08 MED ORDER — HEPARIN SODIUM (PORCINE) 1000 UNIT/ML DIALYSIS: 1900.0000 [IU] | Freq: Once | INTRAMUSCULAR | Status: DC

## 2016-09-08 MED ORDER — SODIUM CHLORIDE 0.9 % IV SOLN
100.0000 mL | INTRAVENOUS | Status: DC | PRN
Start: 2016-09-08 — End: 2016-09-08

## 2016-09-08 MED ORDER — SODIUM CHLORIDE 0.9 % IV SOLN: 100.0000 mL | INTRAVENOUS | Status: DC | PRN

## 2016-09-08 MED ORDER — HEPARIN SODIUM (PORCINE) 1000 UNIT/ML DIALYSIS: 1000.0000 [IU] | INTRAMUSCULAR | Status: DC | PRN

## 2016-09-08 NOTE — Progress Notes (Signed)
Pt has orders to be discharged. Discharge instructions given and pt has no additional questions at this time. Medication regimen reviewed and pt educated. Pt verbalized understanding and has no additional questions. Telemetry box removed. IV removed and site in good condition. Pt stable and waiting for transportation. 

## 2016-09-08 NOTE — Progress Notes (Addendum)
Pharmacy - Home Medications  Noted patient is noncompliant with medications despite having help with medications at home. Spoke with [facility] who fills a voice activated pill box for him monthly. Below is what they fill in the pill box.  Medication history tech notes patient brought in medication bottles from [place] but most of them were last filled in 2016.    Clopidogrel 75 mg PO daily  Gabapentin 100 mg PO tid  Calcium acetate 1334 mg PO tid with meals  Docusate 200 mg PO daily  Hydralazine 25 mg PO tid - has not had in the last 3 weeks or so and MD will not fill until has appt  Milk of mag 15 ml PO daily prn mild constipation  Metoprolol tartrate 12.5 mg PO bid  Nifedipine ER 90 mg PO daily  Fish oil 1000 mg PO bid  Super Beta Prostate 2 tabs PO qhs   Renold Genta, PharmD, BCPS Clinical Pharmacist Phone for today - [place] - [phone] 09/08/2016 2:17 PM

## 2016-09-08 NOTE — Discharge Summary (Signed)
Physician Discharge Summary  Trevor [name] JOA:[id number] DOB: 03-Jul-1946 DOA: 09/07/2016  PCP: [place] date: 09/07/2016 Discharge date: 09/08/2016  Time spent: 45 minutes  Recommendations for Outpatient Follow-up:  Patient will be discharged to home.  Patient will need to follow up with primary care provider within one week of discharge.   Patient should continue medications as prescribed.  Patient should follow a renal diet with 126ml fluid restriction per day.   Discharge Diagnoses:  Principal Problem:   Hypoxemia Active Problems:   Tobacco use disorder   Nonischemic cardiomyopathy (HCC)   History of stroke   Chronic combined systolic and diastolic CHF (congestive heart failure) (HCC)   Secondary hyperparathyroidism ([place])   Coronary artery disease, non-occlusive: Cath 2013 - Moderate D1 & RCA disease   ESRD on hemodialysis ([place])   Anemia of renal disease   Discharge Condition: Stable  Diet recommendation: renal diet with 1267ml fluid restriction per day  Filed Weights   09/08/16 0515 09/08/16 0920 09/08/16 1255  Weight: 60.6 kg (133 lb 11.2 oz) 61.1 kg (134 lb 11.2 oz) 60.1 kg (132 lb 7.9 oz)    History of present illness:  On 09/07/2016 by Dr. Lottie Mussel JAHZIEL [name] is a 70 y.o. male admitting history of end-stage renal disease on hemodialysis Monday, Wednesday and Friday, last dialysis since session was on Friday. He also has a history of cardiomyopathy, chronic combined congestive heart failure,  hypertension, hyperlipidemia, prior history of myocardial infarction, presents to the ED secondary to complaints of severe dyspnea on exertion when he was trying to make his bed this morning.  Upon arrival to the ED, he had sats of 72% on room air. He was placed on 3 L nasal cannula and his sats immediately improved, less dyspneic, less short of breath. He feels much more comfortable now in the ED, he denies fevers or chills, nausea, vomiting, chest  pain, palpitations, diarrhea, is oliguric.  Initially this morning, prior to the EMS arrival had transient left sided chest pains with shortness of breath, but now that has resolved as well. Denies pleuritic pain at this time as well.  Hospital Course:  Acute hypoxemia -Unknown etiology, possibly tobacco abuse -Upon arrival to the ER, patient noted to have O2 sats of 72% on room air.  He was placed on 3L.  Currently patient back on room air with O2 sats in the 90s -Bedside echo done in the ER, did not show right heart strain (per H&P) -Patient does not appear to be volume overloaded -Patient has not missed hemodialysis -CXR unremarkable for infection -VQ Scan normal -Patient was able to ambulate after dialysis.  O2 sats were remained in the high 90s.  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  He will need to follow up with his physician at the [facility] for refills.   Procedures: VQ scan  Consultations: Nephrology  Discharge Exam: Vitals:   09/08/16 1215 09/08/16 1255  BP: (!) 113/51 (!) 169/71  Pulse: 68 (!) 59  Resp:  12  Temp:  97.4 F (36.3 C)     General: Well developed, well nourished, NAD, appears stated age  HEENT: NCAT, mucous membranes moist.  Cardiovascular: S1 S2 auscultated, no  murmurs, RRR  Respiratory: Clear to auscultation bilaterally with equal chest rise  Abdomen: Soft, nontender, nondistended, + bowel sounds  Extremities: warm dry without cyanosis clubbing or edema. LUE AVF +B/T  Neuro: AAOx3, nonfocal  Psych: Normal affect and demeanor with intact judgement and insight  Discharge Instructions Discharge Instructions    Discharge instructions    Complete by:  As directed    Patient will be discharged to home.  Patient will need to follow up with primary care provider within one week of discharge.   Patient should continue medications as prescribed.  Patient should follow a renal diet with 12108ml fluid restriction per day.     Current Discharge Medication List    CONTINUE these medications which have NOT CHANGED   Details  calcium acetate (PHOSLO) 667 MG capsule Take 1,334 mg by mouth 3 (three) times daily with meals.     clopidogrel (PLAVIX) 75 MG tablet Take 1 tablet (75 mg total) by mouth daily. Qty: 30 tablet, Refills: 2    docusate sodium (COLACE) 100 MG capsule Take 200 mg by mouth daily.     gabapentin (NEURONTIN) 100 MG capsule Take 1 capsule (100 mg total) by mouth 3 (three) times daily. Qty: 90 capsule, Refills: 2    hydrALAZINE (APRESOLINE) 50 MG tablet Take 25 mg by mouth 3 (three) times daily.    magnesium hydroxide (MILK OF MAGNESIA) 400 MG/5ML suspension Take 15 mLs by mouth daily as needed for mild constipation.    metoprolol tartrate (LOPRESSOR) 25 MG tablet Take 12.5 mg by mouth 2 (two) times daily.    NIFEdipine (ADALAT CC) 90 MG 24 hr tablet Take 90 mg by mouth daily.    Omega-3 Fatty Acids (FISH OIL) 1000 MG CAPS Take 1,000 mg by mouth 2 (two) times daily.    OVER THE COUNTER MEDICATION Take 2 tablets by mouth at bedtime. Super Beta Prostate (vitamin D, Calcium, Zinc, Selenium, Copper, Manganese, Chromium, molybdenum, Phytosterols, Boron, Silica)      STOP taking these medications     aspirin EC 325 MG EC tablet        carvedilol (COREG) 3.125 MG tablet      cinacalcet (SENSIPAR) 30 MG tablet        No Known Allergies Follow-up Information    [facility]. Schedule an appointment as soon as possible for a visit in 1 week(s).   Why:  Hospital follow up Contact information: [place] [address] [phone]            The results of significant diagnostics from this hospitalization (including imaging, microbiology, ancillary and laboratory) are listed below for reference.    Significant Diagnostic Studies: Dg Chest 2 View  Result Date: 09/07/2016 CLINICAL DATA:  Left-sided chest pain and shortness of breath EXAM: CHEST  2 VIEW COMPARISON:  March 08, 2016 FINDINGS: Lungs are clear. Heart size and pulmonary vascularity are normal. No adenopathy. There is atherosclerotic calcification in the aorta. There is mild degenerative change in the thoracic spine. IMPRESSION: Aortic atherosclerosis.  No edema or consolidation. Electronically Signed   By: Lowella Grip III M.D.   On: 09/07/2016 10:10  Nm Pulmonary Perf And Vent  Result Date: 09/07/2016 CLINICAL DATA:  Hypoxemia EXAM: NUCLEAR MEDICINE VENTILATION - PERFUSION LUNG SCAN TECHNIQUE: Ventilation images were obtained in multiple projections using inhaled aerosol Tc-40m DTPA. Perfusion images were obtained in multiple projections after intravenous injection of Tc-54m MAA. RADIOPHARMACEUTICALS:  32.2 mCi Technetium-54m DTPA aerosol inhalation and 4.06 mCi Technetium-46m MAA IV COMPARISON:  Chest radiography same day FINDINGS: Ventilation: No defects or air trapping. Perfusion: No defects.  Normal perfusion. IMPRESSION: Normal VQ scan. Electronically Signed   By: Nelson Chimes M.D.   On: 09/07/2016 13:10    Microbiology: Recent Results (from the past 240 hour(s))  MRSA PCR Screening     Status: None   Collection Time: 09/07/16  2:54 PM  Result Value Ref Range Status   MRSA by PCR NEGATIVE NEGATIVE  Final    Comment:        The GeneXpert MRSA Assay (FDA approved for NASAL specimens only), is one component of a comprehensive MRSA colonization surveillance program. It is not intended to diagnose MRSA infection nor to guide or monitor treatment for MRSA infections.      Labs: Basic Metabolic Panel:  Recent Labs Lab 09/07/16 1009 09/08/16 0444  NA 139 137  K 4.4 5.2*  CL 100* 101  CO2 25 23  GLUCOSE 92 73  BUN 36* 48*  CREATININE 8.53* 10.06*  CALCIUM 9.8 9.3   Liver Function Tests:  Recent Labs Lab 09/08/16 0444  AST 10*  ALT 11*  ALKPHOS 55  BILITOT 0.5  PROT 6.2*  ALBUMIN 3.2*   No results for input(s): LIPASE, AMYLASE in the last 168 hours. No results for input(s): AMMONIA in the last 168 hours. CBC:  Recent Labs Lab 09/07/16 1009 09/08/16 0444  WBC 5.8 5.7  NEUTROABS 2.9  --   HGB 11.5* 10.3*  HCT 34.9* 31.2*  MCV 89.0 88.9  PLT 154 150   Cardiac Enzymes:  Recent Labs Lab 09/07/16 1319 09/07/16 1813 09/07/16 2334  TROPONINI 0.05* 0.06* 0.05*   BNP: BNP (last 3 results)  Recent Labs  09/07/16 1010  BNP 1,684.3*    ProBNP (last 3 results) No results for input(s): PROBNP in the last 8760 hours.  CBG: No results for input(s): GLUCAP in the last 168 hours.     SignedCristal Ford  [facility] 09/08/2016, 3:10 PM

## 2016-09-08 NOTE — Consult Note (Signed)
Renal Service Consult Note Trevor [name] 09/08/2016 [name] Requesting Physician:  Dr Ree Kida  Reason for Consult:  ESRD pt w acute SOB HPI: The patient is a 70 y.o. year-old with hx of CVA , HTN, R eye blind, ESRD on HD MWF [place].  Had episode this weekend while making his bed of acute onset SOB and CP.  Came to ED where had one reading of hypoxemia 89%.  CXR was clear. Yesterday had V/Q scan which was low prob for PE.  SOB has resovled, on RA w Sat 97 %.  Due for HD today, hasn't missed HD.   Had MI in 2014, no stent/ bypass.  Had large stroked w aphasia, has done a lot of rehab and speech has improved significantly per pt.  Has mild R side weakness.    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 Had one daughter, got divorced. Took his daughter from his ex-wife who was involved with drugs/etoh [place].  Daughter got her nursing degree from [employer]/ [employer] and worked at [employer] and now works at a nursing in [place].  He lives by himself in [place] now.    ROS  denies CP  no joint pain   no HA  no blurry vision  no rash  no diarrhea  no nausea/ vomiting  no dysuria  no difficulty voiding  no change in urine color    Past Medical History  Past Medical History:  Diagnosis Date  . Anxiety   . Arthritis   . Blindness   . Cardiomyopathy   . Chest pain    heart cath 2015, no intervention (LCX 60-80%) otherwise nonobst disease  . 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 ESRD on hemodialysis ([place])    started HD 2014-15  . Headache(784.0)   . HTN (hypertension)   . Myocardial infarction   . Shortness of breath    Past Surgical History  Past Surgical History:  Procedure Laterality  Date  . INSERTION OF DIALYSIS CATHETER  09/21/2012   Procedure: INSERTION OF DIALYSIS CATHETER;  Surgeon: Angelia Mould, MD;  Location: [place];  Service: Vascular;  Laterality: N/A;  Right Internal Jugular Placement  . LEFT HEART CATHETERIZATION WITH CORONARY ANGIOGRAM N/A 09/28/2012   Procedure: LEFT HEART CATHETERIZATION WITH CORONARY ANGIOGRAM;  Surgeon: Sherren Mocha, MD;  Location: [facility] CATH LAB;  Service: Cardiovascular;  Laterality: N/A;  . LEFT HEART CATHETERIZATION WITH CORONARY ANGIOGRAM N/A 07/07/2014   Procedure: LEFT HEART CATHETERIZATION WITH CORONARY ANGIOGRAM;  Surgeon: Leonie Man, MD;  Location: [facility] CATH LAB;  Service: Cardiovascular;  Laterality: N/A;  . right hand    . TEE WITHOUT CARDIOVERSION  10/09/2011   Procedure: TRANSESOPHAGEAL ECHOCARDIOGRAM (TEE);  Surgeon: Lelon Perla, MD;  Location: [facility] ENDOSCOPY;  Service: Cardiovascular;  Laterality: N/A;   Family History  Family History  Problem Relation Age of Onset  . Heart attack Mother     MI in her 47s  . Diabetes Mother   . Alcohol abuse Father   . Anesthesia problems Neg Hx   . Hypotension Neg Hx   . Malignant hyperthermia Neg Hx   . Pseudochol deficiency Neg Hx  Social History  reports that he has been smoking Cigarettes.  He has a 25.00 pack-year smoking history. He has never used smokeless tobacco. He reports that he does not drink alcohol or use drugs. Allergies No Known Allergies Home medications Prior to Admission medications   Medication Sig Start Date End Date Taking? Authorizing Provider  calcium acetate (PHOSLO) 667 MG capsule Take 1,334 mg by mouth 3 (three) times daily with meals.    Yes Historical Provider, MD  clopidogrel (PLAVIX) 75 MG tablet Take 1 tablet (75 mg total) by mouth daily. 04/05/15  Yes Barton Dubois, MD  docusate sodium (COLACE) 100 MG capsule Take 200 mg by mouth daily.    Yes Historical Provider, MD  gabapentin (NEURONTIN) 100 MG capsule Take 1 capsule (100 mg total) by  mouth 3 (three) times daily. 04/05/15  Yes Barton Dubois, MD  hydrALAZINE (APRESOLINE) 50 MG tablet Take 25 mg by mouth 3 (three) times daily.   Yes Historical Provider, MD  magnesium hydroxide (MILK OF MAGNESIA) 400 MG/5ML suspension Take 15 mLs by mouth daily as needed for mild constipation.   Yes Historical Provider, MD  metoprolol tartrate (LOPRESSOR) 25 MG tablet Take 12.5 mg by mouth 2 (two) times daily.   Yes Historical Provider, MD  NIFEdipine (ADALAT CC) 90 MG 24 hr tablet Take 90 mg by mouth daily.   Yes Historical Provider, MD  Omega-3 Fatty Acids (FISH OIL) 1000 MG CAPS Take 1,000 mg by mouth 2 (two) times daily.   Yes Historical Provider, MD  OVER THE COUNTER MEDICATION Take 2 tablets by mouth at bedtime. Super Beta Prostate (vitamin D, Calcium, Zinc, Selenium, Copper, Manganese, Chromium, molybdenum, Phytosterols, Boron, Silica)   Yes Historical Provider, MD  aspirin EC 325 MG EC tablet Take 1 tablet (325 mg total) by mouth daily. Patient not taking: Reported on 09/07/2016 03/11/16   Geradine Girt, DO  atorvastatin (LIPITOR) 40 MG tablet Take 1 tablet (40 mg total) by mouth daily at 6 PM. Patient not taking: Reported on 09/07/2016 08/18/15   Geradine Girt, DO  B Complex-C-Folic Acid (DIALYVITE TABLET) TABS Take 1 tablet by mouth daily. Patient not taking: Reported on 09/07/2016 09/28/12   Sorin June Leap, MD  carvedilol (COREG) 3.125 MG tablet Take 1 tablet (3.125 mg total) by mouth 2 (two) times daily with a meal. Patient not taking: Reported on 09/07/2016 03/11/16   Geradine Girt, DO  cinacalcet (SENSIPAR) 30 MG tablet Take 2 tablets (60 mg total) by mouth daily with breakfast. Patient not taking: Reported on 09/07/2016 03/11/16   Geradine Girt, DO  isosorbide mononitrate (IMDUR) 30 MG 24 hr tablet Take 1 tablet (30 mg total) by mouth daily. Patient not taking: Reported on 09/07/2016 03/12/16   Lavina Hamman, MD  midodrine (PROAMATINE) 5 MG tablet Take 1 tablet (5 mg total) by mouth 3 (three)  times daily with meals. Patient not taking: Reported on 09/07/2016 03/11/16   Geradine Girt, DO  omega-3 acid ethyl esters (LOVAZA) 1 G capsule Take 1 capsule (1 g total) by mouth 2 (two) times daily. Patient not taking: Reported on 09/07/2016 04/05/15   Barton Dubois, MD   Liver Function Tests  Recent Labs Lab 09/08/16 0444  AST 10*  ALT 11*  ALKPHOS 55  BILITOT 0.5  PROT 6.2*  ALBUMIN 3.2*   No results for input(s): LIPASE, AMYLASE in the last 168 hours. CBC  Recent Labs Lab 09/07/16 1009 09/08/16 0444  WBC 5.8 5.7  NEUTROABS 2.9  --  HGB 11.5* 10.3*  HCT 34.9* 31.2*  MCV 89.0 88.9  PLT 154 203   Basic Metabolic Panel  Recent Labs Lab 09/07/16 1009 09/08/16 0444  NA 139 137  K 4.4 5.2*  CL 100* 101  CO2 25 23  GLUCOSE 92 73  BUN 36* 48*  CREATININE 8.53* 10.06*  CALCIUM 9.8 9.3   Iron/TIBC/Ferritin/ %Sat    Component Value Date/Time   IRON 63 05/20/2014 0630   TIBC 198 (L) 05/20/2014 0630   FERRITIN 494 (H) 05/20/2014 0630   IRONPCTSAT 32 05/20/2014 0630    Vitals:   09/07/16 1600 09/07/16 2029 09/08/16 0024 09/08/16 0515  BP: (!) 153/63 (!) 155/68 (!) 172/65 (!) 173/70  Pulse: 65 70 66 68  Resp: 18 18 18 18   Temp: 98.5 F (36.9 C) 98.1 F (36.7 C) 98.2 F (36.8 C) 98.4 F (36.9 C)  TempSrc: Oral Oral Oral Oral  SpO2: 99% 95% 97% 97%  Weight:    60.6 kg (133 lb 11.2 oz)  Height:       Exam Gen alert, slight facial droop No rash, cyanosis or gangrene Sclera anicteric, throat clear  No jvd or bruits Chest clear bilat RRR no MRG Abd soft ntnd no mass or ascites +bs GU normal male  MS no joint effusions or deformity Ext no edema / no wounds or ulcers Neuro is alert, Ox 3 , mild R hemi LUA AVF+bruit    Dialysis: [place] MWF 4h   60.5kg   Hep 1900  LUA AVF  Assessment: 1. Dyspnea/ hypoxemia - CXR clear, no vol excess on exam.  V/Q neg for PE.  Symptoms resolved. Due for HD today.  2. ESRD MWF HD, good compliance w HD 3. Vol is at dry  wt, no vol excess on exam 4. HTN on coreg, also on midodrine 5 mg tid.  BP's high , will hold midodrine here 5. CM EF 45-50% on coreg   Plan - HD today, UF 1 L as tol  Kelly Splinter MD [employer] pager [phone]   09/08/2016, 8:19 AM

## 2016-09-08 NOTE — Procedures (Signed)
Seen on HD Today is regular day Admitted with CP - w/u neg. 2K bath 3.5 hours Anticipate discharge after HD No CP at present  Jamal Maes, MD [facility] [phone] Pager 09/08/2016, 9:38 AM

## 2016-09-08 NOTE — Discharge Instructions (Signed)
Hypoxemia Hypoxemia occurs when your blood does not contain enough oxygen. The body cannot work well when it does not have enough oxygen because every part of your body needs oxygen. Oxygen travels to all parts of the body through your blood. Hypoxemia can develop suddenly or can come on slowly. CAUSES Some common causes of hypoxemia include:  Long-term (chronic) lung diseases, such as chronic obstructive pulmonary disease (COPD) or interstitial lung disease.  Disorders that affect breathing at night, such as sleep apnea.  Fluid buildup in your lungs (pulmonary edema).  Lung infection (pneumonia).  Lung or throat cancer.  Abnormal blood flow that bypasses the lungs (shunt).  Certain diseasesthat affect nerves or muscles.  A collapsed lung (pneumothorax).  A blood clot in the lungs (pulmonary embolus).  Certain types of heart disease.  Slow or shallow breathing (hypoventilation).  Certain medicines.  High altitudes.  Toxic chemicals and gases. SIGNS AND SYMPTOMS Not everyone who has hypoxemia will develop symptoms. If the hypoxemia developed quickly, you will likely have symptoms such as shortness of breath. If the hypoxemia came on slowly over months or years, you may not notice any symptoms. Symptoms can include:  Shortness of breath (dyspnea).  Bluish color of the skin, lips, or nail beds.  Breathing that is fast, noisy, or shallow.  A fast heartbeat.  Feeling tired or sleepy.  Being confused or feeling anxious. DIAGNOSIS To determine if you have hypoxemia, your health care provider may perform:  A physical exam.  Blood tests.  A pulse oximetry. A sensor will be put on your finger, toe, or earlobe to measure the percent of oxygen in your blood. TREATMENT You will likely be treated with oxygen therapy. Depending on the cause of your hypoxemia, you may need oxygen for a short time (weeks or months), or you may need it indefinitely. Your health care provider  may also recommend other therapies to treat the underlying cause of your hypoxemia. HOME CARE INSTRUCTIONS  Only take over-the-counter or prescription medicines as directed by your health care provider.  Follow oxygen safety measures if you are on oxygen therapy. These may include:  Always having a backup supply of oxygen.  Not allowing anyone to smoke around oxygen.  Handling the oxygen tanks carefully and as instructed.  If you smoke, quit. Stay away from people who smoke.  Follow up with your health care provider as directed. SEEK MEDICAL CARE IF:  You have any concerns about your oxygen therapy.  You still have trouble breathing.  You become short of breath when you exercise.  You are tired when you wake up.  You have a headache when you wake up. SEEK IMMEDIATE MEDICAL CARE IF:   Your breathing gets worse.  You have new shortness of breath with normal activity.  You have a bluish color of the skin, lips, or nail beds.  You have confusion or cloudy thinking.  You cough up dark mucus.  You have chest pain.  You have a fever. MAKE SURE YOU:  Understand these instructions.  Will watch your condition.  Will get help right away if you are not doing well or get worse.   This information is not intended to replace advice given to you by your health care provider. Make sure you discuss any questions you have with your health care provider.   Document Released: 06/02/2011 Document Revised: 11/22/2013 Document Reviewed: 06/16/2013 Elsevier Interactive Patient Education [employer].

## 2016-09-08 NOTE — Progress Notes (Signed)
Patient's oxygen saturations on room air are 100% at rest. While ambulating in the hallway, oxygen saturations maintained at 97%.  Patient ambulated approximately 72ft with a front wheel walker. Patient states he was short of breath after ambulation but states he usually uses wheelchair at home and has walker and cane.

## 2016-11-01 ENCOUNTER — Encounter (HOSPITAL_COMMUNITY): Payer: Self-pay | Admitting: Emergency Medicine

## 2016-11-01 ENCOUNTER — Emergency Department (HOSPITAL_COMMUNITY): Payer: Medicare Other

## 2016-11-01 ENCOUNTER — Inpatient Hospital Stay (HOSPITAL_COMMUNITY)
Admission: EM | Admit: 2016-11-01 | Discharge: 2016-11-04 | DRG: 280 | Disposition: A | Discharge status: Home Health | Payer: Medicare Other | Attending: Cardiovascular Disease | Admitting: Cardiovascular Disease

## 2016-11-01 ENCOUNTER — Encounter (HOSPITAL_COMMUNITY)
Admission: EM | Disposition: A | Discharge status: Home Health | Payer: Self-pay | Source: Home / Self Care | Attending: Cardiovascular Disease

## 2016-11-01 DIAGNOSIS — Z7902 Long term (current) use of antithrombotics/antiplatelets: Secondary | ICD-10-CM | POA: Diagnosis not present

## 2016-11-01 DIAGNOSIS — E785 Hyperlipidemia, unspecified: Secondary | ICD-10-CM | POA: Diagnosis present

## 2016-11-01 DIAGNOSIS — Z992 Dependence on renal dialysis: Secondary | ICD-10-CM

## 2016-11-01 DIAGNOSIS — R079 Chest pain, unspecified: Secondary | ICD-10-CM | POA: Diagnosis not present

## 2016-11-01 DIAGNOSIS — I213 ST elevation (STEMI) myocardial infarction of unspecified site: Secondary | ICD-10-CM | POA: Diagnosis not present

## 2016-11-01 DIAGNOSIS — I252 Old myocardial infarction: Secondary | ICD-10-CM | POA: Diagnosis not present

## 2016-11-01 DIAGNOSIS — M1990 Unspecified osteoarthritis, unspecified site: Secondary | ICD-10-CM | POA: Diagnosis present

## 2016-11-01 DIAGNOSIS — I4891 Unspecified atrial fibrillation: Secondary | ICD-10-CM | POA: Diagnosis present

## 2016-11-01 DIAGNOSIS — I161 Hypertensive emergency: Secondary | ICD-10-CM | POA: Diagnosis not present

## 2016-11-01 DIAGNOSIS — F419 Anxiety disorder, unspecified: Secondary | ICD-10-CM | POA: Diagnosis present

## 2016-11-01 DIAGNOSIS — I209 Angina pectoris, unspecified: Secondary | ICD-10-CM | POA: Diagnosis present

## 2016-11-01 DIAGNOSIS — I160 Hypertensive urgency: Secondary | ICD-10-CM

## 2016-11-01 DIAGNOSIS — I2510 Atherosclerotic heart disease of native coronary artery without angina pectoris: Secondary | ICD-10-CM

## 2016-11-01 DIAGNOSIS — I214 Non-ST elevation (NSTEMI) myocardial infarction: Secondary | ICD-10-CM | POA: Diagnosis not present

## 2016-11-01 DIAGNOSIS — F17210 Nicotine dependence, cigarettes, uncomplicated: Secondary | ICD-10-CM | POA: Diagnosis not present

## 2016-11-01 DIAGNOSIS — Z8249 Family history of ischemic heart disease and other diseases of the circulatory system: Secondary | ICD-10-CM | POA: Diagnosis not present

## 2016-11-01 DIAGNOSIS — I25110 Atherosclerotic heart disease of native coronary artery with unstable angina pectoris: Secondary | ICD-10-CM | POA: Diagnosis present

## 2016-11-01 DIAGNOSIS — I200 Unstable angina: Secondary | ICD-10-CM

## 2016-11-01 DIAGNOSIS — Z8673 Personal history of transient ischemic attack (TIA), and cerebral infarction without residual deficits: Secondary | ICD-10-CM | POA: Diagnosis not present

## 2016-11-01 DIAGNOSIS — Z79899 Other long term (current) drug therapy: Secondary | ICD-10-CM

## 2016-11-01 DIAGNOSIS — I429 Cardiomyopathy, unspecified: Secondary | ICD-10-CM | POA: Diagnosis present

## 2016-11-01 DIAGNOSIS — N186 End stage renal disease: Secondary | ICD-10-CM | POA: Diagnosis not present

## 2016-11-01 DIAGNOSIS — E7800 Pure hypercholesterolemia, unspecified: Secondary | ICD-10-CM | POA: Diagnosis not present

## 2016-11-01 DIAGNOSIS — I132 Hypertensive heart and chronic kidney disease with heart failure and with stage 5 chronic kidney disease, or end stage renal disease: Secondary | ICD-10-CM | POA: Diagnosis present

## 2016-11-01 DIAGNOSIS — I509 Heart failure, unspecified: Secondary | ICD-10-CM | POA: Diagnosis present

## 2016-11-01 DIAGNOSIS — I10 Essential (primary) hypertension: Secondary | ICD-10-CM | POA: Diagnosis present

## 2016-11-01 DIAGNOSIS — Z7982 Long term (current) use of aspirin: Secondary | ICD-10-CM

## 2016-11-01 DIAGNOSIS — H547 Unspecified visual loss: Secondary | ICD-10-CM | POA: Diagnosis present

## 2016-11-01 HISTORY — PX: CARDIAC CATHETERIZATION: SHX172

## 2016-11-01 LAB — CBC
HEMATOCRIT: 29.1 % — AB (ref 39.0–52.0)
HEMATOCRIT: 33.2 % — AB (ref 39.0–52.0)
HEMOGLOBIN: 11.2 g/dL — AB (ref 13.0–17.0)
Hemoglobin: 9.8 g/dL — ABNORMAL LOW (ref 13.0–17.0)
MCH: 29.9 pg (ref 26.0–34.0)
MCH: 29.9 pg (ref 26.0–34.0)
MCHC: 33.7 g/dL (ref 30.0–36.0)
MCHC: 33.7 g/dL (ref 30.0–36.0)
MCV: 88.5 fL (ref 78.0–100.0)
MCV: 88.7 fL (ref 78.0–100.0)
Platelets: 148 10*3/uL — ABNORMAL LOW (ref 150–400)
Platelets: 153 10*3/uL (ref 150–400)
RBC: 3.28 MIL/uL — ABNORMAL LOW (ref 4.22–5.81)
RBC: 3.75 MIL/uL — AB (ref 4.22–5.81)
RDW: 14.1 % (ref 11.5–15.5)
RDW: 14.5 % (ref 11.5–15.5)
WBC: 4.8 10*3/uL (ref 4.0–10.5)
WBC: 4.9 10*3/uL (ref 4.0–10.5)

## 2016-11-01 LAB — BASIC METABOLIC PANEL
Anion gap: 10 (ref 5–15)
BUN: 11 mg/dL (ref 6–20)
CALCIUM: 9.1 mg/dL (ref 8.9–10.3)
CHLORIDE: 99 mmol/L — AB (ref 101–111)
CO2: 30 mmol/L (ref 22–32)
CREATININE: 4.69 mg/dL — AB (ref 0.61–1.24)
GFR calc non Af Amer: 11 mL/min — ABNORMAL LOW (ref >60)
GFR, EST AFRICAN AMERICAN: 13 mL/min — AB (ref >60)
GLUCOSE: 73 mg/dL (ref 65–99)
Potassium: 4.8 mmol/L (ref 3.5–5.1)
Sodium: 139 mmol/L (ref 135–145)

## 2016-11-01 LAB — COMPREHENSIVE METABOLIC PANEL
ALBUMIN: 3.4 g/dL — AB (ref 3.5–5.0)
ALK PHOS: 72 U/L (ref 38–126)
ALT: 10 U/L — AB (ref 17–63)
ANION GAP: 13 (ref 5–15)
AST: 17 U/L (ref 15–41)
BUN: 9 mg/dL (ref 6–20)
CALCIUM: 9.3 mg/dL (ref 8.9–10.3)
CHLORIDE: 100 mmol/L — AB (ref 101–111)
CO2: 27 mmol/L (ref 22–32)
CREATININE: 4.27 mg/dL — AB (ref 0.61–1.24)
GFR calc non Af Amer: 13 mL/min — ABNORMAL LOW (ref >60)
GFR, EST AFRICAN AMERICAN: 15 mL/min — AB (ref >60)
GLUCOSE: 116 mg/dL — AB (ref 65–99)
Potassium: 4.3 mmol/L (ref 3.5–5.1)
SODIUM: 140 mmol/L (ref 135–145)
Total Bilirubin: 0.5 mg/dL (ref 0.3–1.2)
Total Protein: 6.9 g/dL (ref 6.5–8.1)

## 2016-11-01 LAB — DIFFERENTIAL
BASOS ABS: 0 10*3/uL (ref 0.0–0.1)
Basophils Relative: 1 %
Eosinophils Absolute: 0.4 10*3/uL (ref 0.0–0.7)
Eosinophils Relative: 8 %
LYMPHS ABS: 1.5 10*3/uL (ref 0.7–4.0)
LYMPHS PCT: 31 %
MONO ABS: 0.4 10*3/uL (ref 0.1–1.0)
MONOS PCT: 9 %
NEUTROS ABS: 2.6 10*3/uL (ref 1.7–7.7)
Neutrophils Relative %: 51 %

## 2016-11-01 LAB — MRSA PCR SCREENING: MRSA by PCR: NEGATIVE

## 2016-11-01 LAB — LIPID PANEL
CHOL/HDL RATIO: 3.2 ratio
CHOL/HDL RATIO: 3.4 ratio
CHOLESTEROL: 123 mg/dL (ref 0–200)
Cholesterol: 111 mg/dL (ref 0–200)
HDL: 33 mg/dL — AB (ref >40)
HDL: 38 mg/dL — ABNORMAL LOW (ref >40)
LDL CALC: 67 mg/dL (ref 0–99)
LDL Cholesterol: 60 mg/dL (ref 0–99)
TRIGLYCERIDES: 88 mg/dL (ref <150)
Triglycerides: 91 mg/dL (ref <150)
VLDL: 18 mg/dL (ref 0–40)
VLDL: 18 mg/dL (ref 0–40)

## 2016-11-01 LAB — PROTIME-INR
INR: 0.99
Prothrombin Time: 13.1 seconds (ref 11.4–15.2)

## 2016-11-01 LAB — APTT: APTT: 33 s (ref 24–36)

## 2016-11-01 LAB — TROPONIN I: Troponin I: 0.03 ng/mL (ref <0.03)

## 2016-11-01 LAB — POCT ACTIVATED CLOTTING TIME
ACTIVATED CLOTTING TIME: 202 s
ACTIVATED CLOTTING TIME: 158 s

## 2016-11-01 SURGERY — LEFT HEART CATH AND CORONARY ANGIOGRAPHY
Anesthesia: LOCAL

## 2016-11-01 MED ORDER — ISOSORB DINITRATE-HYDRALAZINE 20-37.5 MG PO TABS
1.0000 | ORAL_TABLET | Freq: Three times a day (TID) | ORAL | Status: DC
  Administered 2016-11-01 – 2016-11-04 (×9): 1 via ORAL
  Filled 2016-11-01 (×9): qty 1

## 2016-11-01 MED ORDER — CALCIUM ACETATE (PHOS BINDER) 667 MG PO CAPS
1334.0000 mg | ORAL_CAPSULE | Freq: Three times a day (TID) | ORAL | Status: DC
  Administered 2016-11-01 – 2016-11-04 (×9): 1334 mg via ORAL
  Filled 2016-11-01 (×10): qty 2

## 2016-11-01 MED ORDER — MORPHINE SULFATE (PF) 2 MG/ML IV SOLN: 2.0000 mg | INTRAVENOUS | Status: DC | PRN

## 2016-11-01 MED ORDER — ASPIRIN 81 MG PO CHEW: 324.0000 mg | CHEWABLE_TABLET | Freq: Once | ORAL | Status: DC

## 2016-11-01 MED ORDER — HEPARIN (PORCINE) IN NACL 2-0.9 UNIT/ML-% IJ SOLN
INTRAMUSCULAR | Status: DC | PRN
  Administered 2016-11-01: 1000 mL

## 2016-11-01 MED ORDER — NIFEDIPINE ER OSMOTIC RELEASE 90 MG PO TB24
90.0000 mg | ORAL_TABLET | Freq: Every day | ORAL | Status: DC
  Administered 2016-11-01 – 2016-11-03 (×3): 90 mg via ORAL
  Filled 2016-11-01 (×4): qty 1

## 2016-11-01 MED ORDER — SODIUM CHLORIDE 0.9% FLUSH
3.0000 mL | INTRAVENOUS | Status: DC | PRN
  Administered 2016-11-01: 3 mL via INTRAVENOUS
  Filled 2016-11-01: qty 3

## 2016-11-01 MED ORDER — METOPROLOL TARTRATE 12.5 MG HALF TABLET
12.5000 mg | ORAL_TABLET | Freq: Two times a day (BID) | ORAL | Status: DC
  Administered 2016-11-01: 12.5 mg via ORAL
  Filled 2016-11-01: qty 1

## 2016-11-01 MED ORDER — ACETAMINOPHEN 325 MG PO TABS: 650.0000 mg | ORAL_TABLET | ORAL | Status: DC | PRN

## 2016-11-01 MED ORDER — ATROPINE SULFATE 1 MG/10ML IJ SOSY
PREFILLED_SYRINGE | INTRAMUSCULAR | Status: AC
  Filled 2016-11-01: qty 10

## 2016-11-01 MED ORDER — HEPARIN SODIUM (PORCINE) 5000 UNIT/ML IJ SOLN: 60.0000 [IU]/kg | Freq: Once | INTRAMUSCULAR | Status: DC

## 2016-11-01 MED ORDER — SODIUM CHLORIDE 0.9 % IV SOLN
INTRAVENOUS | Status: DC | PRN
  Administered 2016-11-01: 10 mL/h via INTRAVENOUS

## 2016-11-01 MED ORDER — NITROGLYCERIN IN D5W 200-5 MCG/ML-% IV SOLN: 0.0000 ug/min | INTRAVENOUS | Status: DC

## 2016-11-01 MED ORDER — SODIUM CHLORIDE 0.9 % IV SOLN: INTRAVENOUS | Status: AC

## 2016-11-01 MED ORDER — GABAPENTIN 100 MG PO CAPS
100.0000 mg | ORAL_CAPSULE | Freq: Three times a day (TID) | ORAL | Status: DC
  Administered 2016-11-01 – 2016-11-04 (×9): 100 mg via ORAL
  Filled 2016-11-01 (×10): qty 1

## 2016-11-01 MED ORDER — LIDOCAINE HCL (PF) 1 % IJ SOLN
INTRAMUSCULAR | Status: DC | PRN
  Administered 2016-11-01: 20 mL

## 2016-11-01 MED ORDER — SODIUM CHLORIDE 0.9 % IV SOLN
10.0000 mL/h | INTRAVENOUS | Status: DC
  Administered 2016-11-01: 1000 mL/h via INTRAVENOUS

## 2016-11-01 MED ORDER — METOPROLOL TARTRATE 25 MG PO TABS
25.0000 mg | ORAL_TABLET | Freq: Two times a day (BID) | ORAL | Status: DC
Start: 2016-11-01 — End: 2016-11-04
  Administered 2016-11-01 – 2016-11-04 (×6): 25 mg via ORAL
  Filled 2016-11-01 (×6): qty 1

## 2016-11-01 MED ORDER — SODIUM CHLORIDE 0.9% FLUSH
3.0000 mL | Freq: Two times a day (BID) | INTRAVENOUS | Status: DC
  Administered 2016-11-01 – 2016-11-03 (×4): 3 mL via INTRAVENOUS

## 2016-11-01 MED ORDER — CLOPIDOGREL BISULFATE 75 MG PO TABS
75.0000 mg | ORAL_TABLET | Freq: Every day | ORAL | Status: DC
  Administered 2016-11-01 – 2016-11-04 (×4): 75 mg via ORAL
  Filled 2016-11-01 (×4): qty 1

## 2016-11-01 MED ORDER — NITROGLYCERIN IN D5W 200-5 MCG/ML-% IV SOLN
INTRAVENOUS | Status: DC | PRN
  Administered 2016-11-01: 10 ug/min via INTRAVENOUS

## 2016-11-01 MED ORDER — SODIUM CHLORIDE 0.9 % IV SOLN: 250.0000 mL | INTRAVENOUS | Status: DC | PRN

## 2016-11-01 MED ORDER — IOPAMIDOL (ISOVUE-370) INJECTION 76%
INTRAVENOUS | Status: DC | PRN
  Administered 2016-11-01: 50 mL via INTRA_ARTERIAL

## 2016-11-01 MED ORDER — HYDRALAZINE HCL 25 MG PO TABS: 25.0000 mg | ORAL_TABLET | Freq: Three times a day (TID) | ORAL | Status: DC

## 2016-11-01 MED ORDER — DOCUSATE SODIUM 100 MG PO CAPS
200.0000 mg | ORAL_CAPSULE | Freq: Every day | ORAL | Status: DC
  Administered 2016-11-02 – 2016-11-04 (×3): 200 mg via ORAL
  Filled 2016-11-01 (×3): qty 2

## 2016-11-01 MED ORDER — ASPIRIN 81 MG PO CHEW
81.0000 mg | CHEWABLE_TABLET | Freq: Every day | ORAL | Status: DC
  Administered 2016-11-01 – 2016-11-04 (×4): 81 mg via ORAL
  Filled 2016-11-01 (×4): qty 1

## 2016-11-01 MED ORDER — ONDANSETRON HCL 4 MG/2ML IJ SOLN: 4.0000 mg | Freq: Four times a day (QID) | INTRAMUSCULAR | Status: DC | PRN

## 2016-11-01 MED ORDER — METOPROLOL TARTRATE 25 MG PO TABS
25.0000 mg | ORAL_TABLET | Freq: Once | ORAL | Status: AC
  Administered 2016-11-01: 25 mg via ORAL

## 2016-11-01 MED ORDER — HEPARIN SODIUM (PORCINE) 5000 UNIT/ML IJ SOLN
60.0000 [IU]/kg | INTRAMUSCULAR | Status: AC
  Administered 2016-11-01: 4000 [IU] via INTRAVENOUS

## 2016-11-01 MED ORDER — HYDRALAZINE HCL 20 MG/ML IJ SOLN
10.0000 mg | INTRAMUSCULAR | Status: DC | PRN
  Administered 2016-11-01 (×2): 10 mg via INTRAVENOUS
  Filled 2016-11-01: qty 1

## 2016-11-01 SURGICAL SUPPLY — 8 items
CATH INFINITI 5FR MULTPACK ANG (CATHETERS) ×2 IMPLANT
KIT HEART LEFT (KITS) ×2 IMPLANT
PACK CARDIAC CATHETERIZATION (CUSTOM PROCEDURE TRAY) ×2 IMPLANT
SHEATH PINNACLE 6F 10CM (SHEATH) ×2 IMPLANT
TRANSDUCER W/STOPCOCK (MISCELLANEOUS) ×2 IMPLANT
TUBING CIL FLEX 10 FLL-RA (TUBING) ×2 IMPLANT
WIRE EMERALD 3MM-J .035X150CM (WIRE) ×2 IMPLANT
WIRE HI TORQ VERSACORE-J 145CM (WIRE) ×2 IMPLANT

## 2016-11-01 NOTE — Progress Notes (Signed)
Pt educated on sheath removal, need to notify nurse of any change in pain, sensation, bleeding and remain flat and in bed without moving RLL for 4 hours after removal. Pt reports understanding. Sheath removed at 0952. Manual pressure held for 20 min until hemostasis achieved. Site dressed with gauze and tegaderm. Pt tolerated procedure well. Afterwards R groin level 0, BLE equally warm, pink, dry, pulses 1+, sensation intact. Pt did teach back for instructions regarding nurse notification and call bell in reach.

## 2016-11-01 NOTE — ED Notes (Signed)
Pt has pain in the left chest with SOB. Pt states his pain is currently decreased.

## 2016-11-01 NOTE — Progress Notes (Signed)
eLink Physician-Brief Progress Note Patient Name: Trevor [name] DOB: 07-Jun-1946 MRN: [id number]   Date of Service  11/01/2016  HPI/Events of Note  70 year old male with known history of coronary artery disease with prior MI as well as end-stage renal disease on hemodialysis and hypertension. Presenting with chest discomfort with radiation to his left arm. Patient taken emergently to cath lab without obvious obstructing lesion. Chest discomfort believed to be secondary to hypertension which is currently uncontrolled. Chest pain significantly improved with nitroglycerin. Currently on aspirin 81 mg after receiving 325 mg en route, Plavix 75 mg, Lopressor 12.5 mg, Procardia XL, & IV medications as needed. No camera is available for camera check at this time.   eICU Interventions  Continuing current plan of care as per cardiology admitting service      Intervention Category Evaluation Type: New Patient Evaluation  Tera Partridge 11/01/2016, 2:08 AM

## 2016-11-01 NOTE — ED Provider Notes (Addendum)
[facility] DEPT Provider Note   CSN: [id number] Arrival date & time: 11/01/16  0011 By signing my name below, I, Trevor [name], attest that this documentation has been prepared under the direction and in the presence of Ezequiel Essex, MD. Electronically Signed: Georgette [name], ED Scribe. 11/01/16. 12:24 AM.  History   Chief Complaint Chief Complaint  Patient presents with  . Chest Pain    CODE STEMI   HPI Comments: Trevor [name] is a 70 y.o. male with h/o CVA, HTN, TIA, AAA, A-fib, CHF, MI, and end stage renal disease, who presents to the Emergency Department by EMS complaining of chest pain radiating to left shoulder onset 30 mins just PTA. Pt also has associated intermittent shortness of breath. Pt reports he was taking a nap and woke up with this pain; he notes he was asymptomatic prior. EMS reports that pt was given 324 Aspirin en route with minimal relief. Pt states he had h/o MI in 2015 and reports that his pain at this time feels similar. Pt is on dialysis for ESRD and his last dialysis was 10/31/16 (yesterday). He is unsure if he is on blood thinners. Pt denies fever, chills, back pain, abdominal pain, or any other associated symptoms.   The history is provided by the patient. No language interpreter was used.    Past Medical History:  Diagnosis Date  . Anxiety   . Arthritis   . Blindness   . Cardiomyopathy   . Chest pain    heart cath 2015, no intervention (LCX 60-80%) otherwise nonobst disease  . 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 ESRD on hemodialysis ([facility])    started HD 2014-15  . Headache(784.0)   . HTN (hypertension)   . Myocardial infarction   . Shortness of breath     Patient Active Problem List   Diagnosis Date Noted  . Hypoxemia 09/07/2016  . Essential hypertension 03/08/2016  . Orthostatic hypotension 01/12/2016  . Hyperlipidemia 01/12/2016  . Chest pain at rest   . ESRD (end stage renal disease) ([place])     . Cough   . Weakness generalized 12/26/2015  . Hypertensive heart disease with CHF (congestive heart failure) ([place])   . Chest pain 08/12/2015  . Malignant hypertension 08/12/2015  . Carotid stenosis   . TIA (transient ischemic attack) 04/04/2015  . Weakness 04/03/2015  . Stroke ([place]) 04/03/2015  . Abdominal aortic aneurysm ([place]) 03/06/2015  . Generalized weakness 09/08/2014  . Acute encephalopathy 09/07/2014  . CAD (coronary artery disease) 08/08/2014  . NSTEMI (non-ST elevated myocardial infarction) - Presumed Type II in setting of HTN Emergency 07/07/2014  . Hypertensive emergency 07/05/2014  . Acute on chronic combined systolic and diastolic CHF (congestive heart failure) ([place]) 07/05/2014  . Protein-calorie malnutrition, severe ([place]) 05/20/2014  . Hypokalemia 05/19/2014  . Syncope and collapse 05/19/2014  . ESRD on hemodialysis ([place]) 05/19/2014  . Anemia of renal disease 05/19/2014  . Coronary artery disease, non-occlusive: Cath 2013 - Moderate D1 & RCA disease 09/28/2012  . Anemia 09/27/2012  . Secondary hyperparathyroidism ([place]) 09/27/2012  . Non compliance with medical treatment 09/27/2012  . Chronic combined systolic and diastolic CHF (congestive heart failure) ([place]) 10/10/2011  . A-fib ([place]) 10/08/2011  . History of stroke 10/07/2011  . Cocaine abuse 10/07/2011  . Severe sinus bradycardia 10/07/2011  . Nonischemic cardiomyopathy ([place]) 09/18/2011  . Tobacco use disorder 08/07/2011  . Bruit 08/07/2011    Past Surgical History:  Procedure Laterality Date  . INSERTION OF DIALYSIS CATHETER  09/21/2012   Procedure: INSERTION OF DIALYSIS CATHETER;  Surgeon: Angelia Mould, MD;  Location: [facility];  Service: Vascular;  Laterality: N/A;  Right Internal Jugular Placement  . LEFT HEART CATHETERIZATION WITH CORONARY ANGIOGRAM N/A 09/28/2012   Procedure: LEFT HEART CATHETERIZATION WITH CORONARY ANGIOGRAM;  Surgeon: Sherren Mocha, MD;  Location: [facility] CATH LAB;  Service:  Cardiovascular;  Laterality: N/A;  . LEFT HEART CATHETERIZATION WITH CORONARY ANGIOGRAM N/A 07/07/2014   Procedure: LEFT HEART CATHETERIZATION WITH CORONARY ANGIOGRAM;  Surgeon: Leonie Man, MD;  Location: [facility] CATH LAB;  Service: Cardiovascular;  Laterality: N/A;  . right hand    . TEE WITHOUT CARDIOVERSION  10/09/2011   Procedure: TRANSESOPHAGEAL ECHOCARDIOGRAM (TEE);  Surgeon: Lelon Perla, MD;  Location: [facility] ENDOSCOPY;  Service: Cardiovascular;  Laterality: N/A;       Home Medications    Prior to Admission medications   Medication Sig Start Date End Date Taking? Authorizing Provider  calcium acetate (PHOSLO) 667 MG capsule Take 1,334 mg by mouth 3 (three) times daily with meals.     Historical Provider, MD  clopidogrel (PLAVIX) 75 MG tablet Take 1 tablet (75 mg total) by mouth daily. 04/05/15   Barton Dubois, MD  docusate sodium (COLACE) 100 MG capsule Take 200 mg by mouth daily.     Historical Provider, MD  gabapentin (NEURONTIN) 100 MG capsule Take 1 capsule (100 mg total) by mouth 3 (three) times daily. 04/05/15   Barton Dubois, MD  hydrALAZINE (APRESOLINE) 50 MG tablet Take 25 mg by mouth 3 (three) times daily.    Historical Provider, MD  magnesium hydroxide (MILK OF MAGNESIA) 400 MG/5ML suspension Take 15 mLs by mouth daily as needed for mild constipation.    Historical Provider, MD  metoprolol tartrate (LOPRESSOR) 25 MG tablet Take 12.5 mg by mouth 2 (two) times daily.    Historical Provider, MD  NIFEdipine (ADALAT CC) 90 MG 24 hr tablet Take 90 mg by mouth daily.    Historical Provider, MD  Omega-3 Fatty Acids (FISH OIL) 1000 MG CAPS Take 1,000 mg by mouth 2 (two) times daily.    Historical Provider, MD  OVER THE COUNTER MEDICATION Take 2 tablets by mouth at bedtime. Super Beta Prostate (vitamin D, Calcium, Zinc, Selenium, Copper, Manganese, Chromium, molybdenum, Phytosterols, Boron, Silica)    Historical Provider, MD    Family History Family History  Problem Relation Age of  Onset  . Heart attack Mother     MI in her 45s  . Diabetes Mother   . Alcohol abuse Father   . Anesthesia problems Neg Hx   . Hypotension Neg Hx   . Malignant hyperthermia Neg Hx   . Pseudochol deficiency Neg Hx     Social History Social History  Substance Use Topics  . Smoking status: Current Some Day Smoker    Packs/day: 0.50    Years: 50.00    Types: Cigarettes  . Smokeless tobacco: Never Used  . Alcohol use No     Comment: Occasional     Allergies   Patient has no known allergies.   Review of Systems Review of Systems 10 Systems reviewed and all are negative for acute change except as noted in the HPI. Physical Exam Updated Vital Signs BP 174/76 (BP Location: Right Arm)   Pulse 89   Resp 22   Ht 5\' 10"  (1.778 m)   Wt 132 lb 7.9 oz (60.1 kg)   SpO2 100%  BMI 19.01 kg/m   Physical Exam  Constitutional: He is oriented to person, place, and time. He appears well-developed and well-nourished. He appears distressed.  Appears uncomfortable.  HENT:  Head: Normocephalic and atraumatic.  Mouth/Throat: Oropharynx is clear and moist. No oropharyngeal exudate.  Eyes: Conjunctivae and EOM are normal. Pupils are equal, round, and reactive to light.  Neck: Normal range of motion. Neck supple.  No meningismus.  Cardiovascular: Normal rate, regular rhythm, normal heart sounds and intact distal pulses.   No murmur heard. Pulmonary/Chest: Effort normal and breath sounds normal. No respiratory distress. He has no wheezes. He has no rales.  Abdominal: Soft. There is no tenderness. There is no rebound and no guarding.  Musculoskeletal: Normal range of motion. He exhibits no edema or tenderness.  Dialysis left upper extremity fistula with thrill. No peripheral edema. Intact radial pulses.  Neurological: He is alert and oriented to person, place, and time. No cranial nerve deficit. He exhibits normal muscle tone. Coordination normal.   5/5 strength throughout. CN 2-12  intact.Equal grip strength.   Skin: Skin is warm. He is diaphoretic.  Psychiatric: He has a normal mood and affect. His behavior is normal.  Nursing note and vitals reviewed.    ED Treatments / Results  DIAGNOSTIC STUDIES: Oxygen Saturation is 100% on [place] 3L, normal by my interpretation.    COORDINATION OF CARE: 12:16 AM Discussed treatment plan with pt at bedside which includes Nitroglycerin and Heparin and pt agreed to plan.  Labs (all labs ordered are listed, but only abnormal results are displayed) Labs Reviewed  CBC - Abnormal; Notable for the following:       Result Value   RBC 3.75 (*)    Hemoglobin 11.2 (*)    HCT 33.2 (*)    All other components within normal limits  COMPREHENSIVE METABOLIC PANEL - Abnormal; Notable for the following:    Chloride 100 (*)    Glucose, Bld 116 (*)    Creatinine, Ser 4.27 (*)    Albumin 3.4 (*)    ALT 10 (*)    GFR calc non Af Amer 13 (*)    GFR calc Af Amer 15 (*)    All other components within normal limits  TROPONIN I - Abnormal; Notable for the following:    Troponin I 0.03 (*)    All other components within normal limits  LIPID PANEL - Abnormal; Notable for the following:    HDL 38 (*)    All other components within normal limits  LIPID PANEL - Abnormal; Notable for the following:    HDL 33 (*)    All other components within normal limits  CBC - Abnormal; Notable for the following:    RBC 3.28 (*)    Hemoglobin 9.8 (*)    HCT 29.1 (*)    Platelets 148 (*)    All other components within normal limits  MRSA PCR SCREENING  DIFFERENTIAL  PROTIME-INR  APTT  BASIC METABOLIC PANEL  POCT ACTIVATED CLOTTING TIME    EKG  EKG Interpretation None       Radiology Dg Chest Port 1 View  Result Date: 11/01/2016 CLINICAL DATA:  Acute onset of generalized chest pain and shortness of breath. Initial encounter. EXAM: PORTABLE CHEST 1 VIEW COMPARISON:  Chest radiograph from 09/07/2016 FINDINGS: The lungs are well-aerated.  Pulmonary vascularity is at the upper limits of normal. Mild bibasilar atelectasis is seen. There is no evidence of pleural effusion or pneumothorax. The cardiomediastinal silhouette is within normal  limits. No acute osseous abnormalities are seen. IMPRESSION: Mild bibasilar atelectasis noted.  Lungs otherwise grossly clear. Electronically Signed   By: Garald Balding M.D.   On: 11/01/2016 01:18    Procedures Procedures (including critical care time)  Medications Ordered in ED Medications  aspirin chewable tablet 324 mg (not administered)  heparin injection 3,600 Units (not administered)  0.9 %  sodium chloride infusion (not administered)  heparin injection 3,600 Units (4,000 Units Intravenous Given 11/01/16 0020)     Initial Impression / Assessment and Plan / ED Course  I have reviewed the triage vital signs and the nursing notes.  Pertinent labs & imaging results that were available during my care of the patient were reviewed by me and considered in my medical decision making (see chart for details).  Clinical Course   Dialysis patient who presents as code STEMI by EMS. Woke up with 2 hours of chest pain and shortness of breath similar to previous MI. EKG shows ST elevation in aVR with depression in II, III, and F aVF.  ECG showed STE in AVR and V1 with diffuse ST depressions consistent with diffuse ischemia vs prox LAD STEMI and cath lab activated en route.  Code STEMI activated by EMS>  ASA, NTG, heparin given.  Cardiology fellow Dr. Karlyn Agee at bedside.  CP improving with improvement of BP.  No respiratory distress. Taken emergently to cath lab. Airway and mental status remained stable in the ED.  CRITICAL CARE Performed by: Ezequiel Essex Total critical care time: 35 minutes Critical care time was exclusive of separately billable procedures and treating other patients. Critical care was necessary to treat or prevent imminent or life-threatening deterioration. Critical care was  time spent personally by me on the following activities: development of treatment plan with patient and/or surrogate as well as nursing, discussions with consultants, evaluation of patient's response to treatment, examination of patient, obtaining history from patient or surrogate, ordering and performing treatments and interventions, ordering and review of laboratory studies, ordering and review of radiographic studies, pulse oximetry and re-evaluation of patient's condition.   Final Clinical Impressions(s) / ED Diagnoses   Final diagnoses:  ST elevation myocardial infarction (STEMI), unspecified artery (HCC)    New Prescriptions New Prescriptions   No medications on file  I personally performed the services described in this documentation, which was scribed in my presence. The recorded information has been reviewed and is accurate.     Ezequiel Essex, MD 11/01/16 [id number]    Ezequiel Essex, MD 11/01/16 [phone]

## 2016-11-01 NOTE — Progress Notes (Signed)
Patient Name: Trevor [name] Date of Encounter: 11/01/2016  Primary Cardiologist: [facility] Problem List     Active Problems:   Chest pain   ST elevation myocardial infarction (STEMI) [facility])   Ischemic chest pain ([place])     Subjective   Feels well. No angina, no dyspnea lying flat, no pain or swelling at right femoral access site. SBP still a little high on NTG IV @15  mcg.  Inpatient Medications    Scheduled Meds: . aspirin  81 mg Oral Daily  . atropine      . calcium acetate  1,334 mg Oral TID WC  . clopidogrel  75 mg Oral Daily  . docusate sodium  200 mg Oral Daily  . gabapentin  100 mg Oral TID  . metoprolol tartrate  12.5 mg Oral BID  . NIFEdipine  90 mg Oral Daily  . sodium chloride flush  3 mL Intravenous Q12H   Continuous Infusions: . nitroGLYCERIN 40 mcg/min (11/01/16 1000)   PRN Meds: sodium chloride, acetaminophen, hydrALAZINE, morphine injection, ondansetron (ZOFRAN) IV, sodium chloride flush   Vital Signs    Vitals:   11/01/16 1000 11/01/16 1005 11/01/16 1010 11/01/16 1015  BP: (!) 108/52 (!) 94/54 (!) 104/53 (!) 127/59  Pulse: 69 69 67 63  Resp: (!) 31 (!) 27 (!) 23 12  Temp:      TempSrc:      SpO2: 100% 99% 98% 100%  Weight:      Height:        Intake/Output Summary (Last 24 hours) at 11/01/16 1052 Last data filed at 11/01/16 1000  Gross per 24 hour  Intake           233.37 ml  Output                0 ml  Net           233.37 ml   Filed Weights   11/01/16 0000 11/01/16 0147  Weight: 132 lb 7.9 oz (60.1 kg) 133 lb 9.6 oz (60.6 kg)    Physical Exam   Comofrtable GEN: Well nourished, well developed, in no acute distress.  HEENT: Grossly normal.  Neck: Supple, no JVD, carotid bruits, or masses. Cardiac: RRR, no murmurs, rubs, or gallops. No clubbing, cyanosis, edema.  Radials/DP/PT 2+ and equal bilaterally. AV fistula upper left arm with thrill.  Respiratory:  Respirations regular and unlabored, clear to auscultation  bilaterally. GI: Soft, nontender, nondistended, BS + x 4. MS: no deformity or atrophy. R groin site without bleeding or hematoma. Skin: warm and dry, no rash. Neuro:  Strength and sensation are intact. Psych: AAOx3.  Normal affect.  Labs    CBC  Recent Labs  11/01/16 0012 11/01/16 0500  WBC 4.9 4.8  NEUTROABS 2.6  --   HGB 11.2* 9.8*  HCT 33.2* 29.1*  MCV 88.5 88.7  PLT 153 462*   Basic Metabolic Panel  Recent Labs  11/01/16 0012 11/01/16 0500  NA 140 139  K 4.3 4.8  CL 100* 99*  CO2 27 30  GLUCOSE 116* 73  BUN 9 11  CREATININE 4.27* 4.69*  CALCIUM 9.3 9.1   Liver Function Tests  Recent Labs  11/01/16 0012  AST 17  ALT 10*  ALKPHOS 72  BILITOT 0.5  PROT 6.9  ALBUMIN 3.4*   No results for input(s): LIPASE, AMYLASE in the last 72 hours. Cardiac Enzymes  Recent Labs  11/01/16 0012  TROPONINI 0.03*   BNP Invalid input(s): POCBNP  D-Dimer No results for input(s): DDIMER in the last 72 hours. Hemoglobin A1C No results for input(s): HGBA1C in the last 72 hours. Fasting Lipid Panel  Recent Labs  11/01/16 0506  CHOL 111  HDL 33*  LDLCALC 60  TRIG 91  CHOLHDL 3.4   Thyroid Function Tests No results for input(s): TSH, T4TOTAL, T3FREE, THYROIDAB in the last 72 hours.  Invalid input(s): FREET3  Telemetry    NSR - Personally Reviewed  ECG    None available for review, ordered.   Radiology    Dg Chest Port 1 View  Result Date: 11/01/2016 CLINICAL DATA:  Acute onset of generalized chest pain and shortness of breath. Initial encounter. EXAM: PORTABLE CHEST 1 VIEW COMPARISON:  Chest radiograph from 09/07/2016 FINDINGS: The lungs are well-aerated. Pulmonary vascularity is at the upper limits of normal. Mild bibasilar atelectasis is seen. There is no evidence of pleural effusion or pneumothorax. The cardiomediastinal silhouette is within normal limits. No acute osseous abnormalities are seen. IMPRESSION: Mild bibasilar atelectasis noted.  Lungs  otherwise grossly clear. Electronically Signed   By: Garald Balding M.D.   On: 11/01/2016 01:18    Cardiac Studies  Diagnostic Diagram        Patient Profile     70 year old man with ESRD on MWF HD, known moderate CAD, presents with angina in the setting of severe HTN. Cardiac cath shows moderate LAD and LCX disease, unchanged from 2015. Marginal troponin increase  Assessment & Plan    Unstable angina due to hypertensive urgency on background of moderate CAD and ESRD. He claims compliance with meds and denies recent stimulant use (UDS normal). Increase PO meds and wean off NTG. Will try Bidil. Medical Rx for CAD  Signed, Sanda Klein, MD  11/01/2016, 10:52 AM

## 2016-11-01 NOTE — H&P (Signed)
Cc: chest pain  HPI: 81 yoM with hx of HTN, HLD, CAD, prior MI, ESRD on HD, who presented with 2 hours of severe 8/10 chest discomfort radiating to his L arm and associated with diaphoresis and shortness of breath.  En route patient received 325 ASA.  ECG showed STE in AVR and V1 with diffuse ST depressions consistent with diffuse ischemia vs prox LAD STEMI and cath lab activated en route.  Initial vitals were HR 115 with BP 210.    In [place]: Patient received IV heparin and 1 SL NTG, 25 po metoprolol.  Chest pain decreased to 2/10 with NTG, with improvement in BP and decrease in reciprocal ST depressions in inferior leads.  PMhx: Past Medical History:  Diagnosis Date  . Anxiety   . Arthritis   . Blindness   . Cardiomyopathy   . Chest pain    heart cath 2015, no intervention (LCX 60-80%) otherwise nonobst disease  . 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 ESRD on hemodialysis ([place])    started HD 2014-15  . Headache(784.0)   . HTN (hypertension)   . Myocardial infarction   . Shortness of breath    Meds: No current facility-administered medications on file prior to encounter.    Current Outpatient Prescriptions on File Prior to Encounter  Medication Sig Dispense Refill  . calcium acetate (PHOSLO) 667 MG capsule Take 1,334 mg by mouth 3 (three) times daily with meals.     . clopidogrel (PLAVIX) 75 MG tablet Take 1 tablet (75 mg total) by mouth daily. 30 tablet 2  . docusate sodium (COLACE) 100 MG capsule Take 200 mg by mouth daily.     Marland Kitchen gabapentin (NEURONTIN) 100 MG capsule Take 1 capsule (100 mg total) by mouth 3 (three) times daily. 90 capsule 2  . hydrALAZINE (APRESOLINE) 50 MG tablet Take 25 mg by mouth 3 (three) times daily.    . magnesium hydroxide (MILK OF MAGNESIA) 400 MG/5ML suspension Take 15 mLs by mouth daily as needed for mild constipation.    . metoprolol tartrate (LOPRESSOR) 25 MG tablet Take 12.5 mg by mouth 2 (two)  times daily.    Marland Kitchen NIFEdipine (ADALAT CC) 90 MG 24 hr tablet Take 90 mg by mouth daily.    . Omega-3 Fatty Acids (FISH OIL) 1000 MG CAPS Take 1,000 mg by mouth 2 (two) times daily.    Marland Kitchen OVER THE COUNTER MEDICATION Take 2 tablets by mouth at bedtime. Super Beta Prostate (vitamin D, Calcium, Zinc, Selenium, Copper, Manganese, Chromium, molybdenum, Phytosterols, Boron, Silica)     Allergies: No Known Allergies  Exam: BP 174/76 (BP Location: Right Arm)   Pulse 89   Resp 22   Ht 5\' 10"  (1.778 m)   Wt 60.1 kg (132 lb 7.9 oz)   SpO2 100%   BMI 19.01 kg/m  Elderly gentleman in NAD JVP normal CTAB RRR s1 s2 no mrg Soft nt nd No edema L arm fistula Alert, orietned, difficulty recalling history, unable to remember if he had prior stents, but believes he had a heart attack in 2015  ECG: EMS strips with ischemic changes as described in HPI 12 lead post nitro with improving changes  Labs: pedning  A/P 69yoM with multiple cardiac risk factors, prior MI, presents with hypertension, severe chest pain, and ischemic ECG.  Symptoms and ECG improved with NTG.  Given risk factors and ECG suggestive of potential prox LAD diseaese,  plan to proceed with emergent LHC:  Ischemia 1.  Continue asa/plavix  2.  Continue metoprolol 3.  If requires intervention would start statin, (ldl was 35 in 9/16) 4.  BP control. Keep sbp < 160  Continue home meds  FULL CODE  Terressa Koyanagi, MD Cardiology

## 2016-11-01 NOTE — ED Triage Notes (Signed)
[facility] states pt woke up this evening with SOB and Chest pain. Depression in II, III, AVF. 324 ASA, Unsuccessful IV attempts. RA sat's in the 90's. Pt pressure was 210/103. EKG transmitted and Code Stemi called. Pt had dialysis this date.   Pt has pain in the left chest with SOB. Pt states his pain is currently decreased.

## 2016-11-02 DIAGNOSIS — I214 Non-ST elevation (NSTEMI) myocardial infarction: Principal | ICD-10-CM

## 2016-11-02 LAB — GLUCOSE, CAPILLARY: Glucose-Capillary: 91 mg/dL (ref 65–99)

## 2016-11-02 NOTE — Progress Notes (Signed)
Pt states he uses wheelchair and walker at home with assistance.

## 2016-11-02 NOTE — Progress Notes (Addendum)
Patient Name: Trevor [name] Date of Encounter: 11/02/2016  Primary Cardiologist: [facility] Problem List     Principal Problem:   NSTEMI (non-ST elevated myocardial infarction) - Presumed Type II in setting of HTN Emergency Active Problems:   ESRD on hemodialysis [facility])   Hypertensive emergency   CAD (coronary artery disease)   Hyperlipidemia   Essential hypertension   ST elevation myocardial infarction (STEMI) ([place])   Ischemic chest pain ([place])     Subjective   Denies dyspnea or angina. BP better after adding Bidil. Off IV meds  Inpatient Medications    Scheduled Meds: . aspirin  81 mg Oral Daily  . calcium acetate  1,334 mg Oral TID WC  . clopidogrel  75 mg Oral Daily  . docusate sodium  200 mg Oral Daily  . gabapentin  100 mg Oral TID  . isosorbide-hydrALAZINE  1 tablet Oral TID  . metoprolol tartrate  25 mg Oral BID  . NIFEdipine  90 mg Oral Daily  . sodium chloride flush  3 mL Intravenous Q12H   Continuous Infusions: . nitroGLYCERIN Stopped (11/01/16 1638)   PRN Meds: sodium chloride, acetaminophen, hydrALAZINE, morphine injection, ondansetron (ZOFRAN) IV, sodium chloride flush   Vital Signs    Vitals:   11/02/16 0444 11/02/16 0500 11/02/16 0600 11/02/16 0824  BP: (!) 144/55 (!) 131/56 (!) 142/52   Pulse: 78 71 71   Resp: (!) 21 18 (!) 9   Temp: 99 F (37.2 C)   99.3 F (37.4 C)  TempSrc: Oral   Oral  SpO2: 97% 97% 99%   Weight:      Height:        Intake/Output Summary (Last 24 hours) at 11/02/16 1004 Last data filed at 11/01/16 1900  Gross per 24 hour  Intake            161.7 ml  Output                0 ml  Net            161.7 ml   Filed Weights   11/01/16 0000 11/01/16 0147  Weight: 132 lb 7.9 oz (60.1 kg) 133 lb 9.6 oz (60.6 kg)    Physical Exam   Comfortable GEN: Well nourished, well developed, in no acute distress.  HEENT: Grossly normal.  Neck: Supple, no JVD, carotid bruits, or masses. Cardiac: RRR, no murmurs,  rubs, or gallops. No clubbing, cyanosis, edema.  Radials/DP/PT 2+ and equal bilaterally. AV fistula upper left arm with thrill.  Respiratory:  Respirations regular and unlabored, clear to auscultation bilaterally. GI: Soft, nontender, nondistended, BS + x 4. MS: no deformity or atrophy. R groin site without bleeding or hematoma. Skin: warm and dry, no rash. Neuro:  Strength and sensation are intact. Psych: AAOx3.  Normal affect.   Labs    CBC  Recent Labs  11/01/16 0012 11/01/16 0500  WBC 4.9 4.8  NEUTROABS 2.6  --   HGB 11.2* 9.8*  HCT 33.2* 29.1*  MCV 88.5 88.7  PLT 153 213*   Basic Metabolic Panel  Recent Labs  11/01/16 0012 11/01/16 0500  NA 140 139  K 4.3 4.8  CL 100* 99*  CO2 27 30  GLUCOSE 116* 73  BUN 9 11  CREATININE 4.27* 4.69*  CALCIUM 9.3 9.1   Liver Function Tests  Recent Labs  11/01/16 0012  AST 17  ALT 10*  ALKPHOS 72  BILITOT 0.5  PROT 6.9  ALBUMIN 3.4*  No results for input(s): LIPASE, AMYLASE in the last 72 hours. Cardiac Enzymes  Recent Labs  11/01/16 0012  TROPONINI 0.03*   Fasting Lipid Panel  Recent Labs  11/01/16 0506  CHOL 111  HDL 33*  LDLCALC 60  TRIG 91  CHOLHDL 3.4    Telemetry    NSR - Personally Reviewed  ECG    NSR, LVH - Personally Reviewed  Radiology    Dg Chest Port 1 View  Result Date: 11/01/2016 CLINICAL DATA:  Acute onset of generalized chest pain and shortness of breath. Initial encounter. EXAM: PORTABLE CHEST 1 VIEW COMPARISON:  Chest radiograph from 09/07/2016 FINDINGS: The lungs are well-aerated. Pulmonary vascularity is at the upper limits of normal. Mild bibasilar atelectasis is seen. There is no evidence of pleural effusion or pneumothorax. The cardiomediastinal silhouette is within normal limits. No acute osseous abnormalities are seen. IMPRESSION: Mild bibasilar atelectasis noted.  Lungs otherwise grossly clear. Electronically Signed   By: Garald Balding M.D.   On: 11/01/2016 01:18     Cardiac Studies   Diagnostic Diagram       Patient Profile       70 year old man with ESRD on MWF HD, known moderate CAD, presents with angina in the setting of severe HTN. Cardiac cath shows moderate LAD and LCX disease, unchanged from 2015. Marginal troponin increase   Assessment & Plan    Unstable angina due to hypertensive urgency on background of moderate CAD and ESRD. He claims compliance with meds and denies recent stimulant use (UDS normal). Now off NTG. Decent BP control on Bidil. Medical Rx for CAD. Transfer telemetry. Possible DC after HD tomorrow.   Signed, Sanda Klein, MD  11/02/2016, 10:04 AM

## 2016-11-03 ENCOUNTER — Encounter (HOSPITAL_COMMUNITY): Payer: Self-pay | Admitting: Cardiovascular Disease

## 2016-11-03 DIAGNOSIS — E7800 Pure hypercholesterolemia, unspecified: Secondary | ICD-10-CM

## 2016-11-03 DIAGNOSIS — I161 Hypertensive emergency: Secondary | ICD-10-CM

## 2016-11-03 LAB — RENAL FUNCTION PANEL
Albumin: 3.2 g/dL — ABNORMAL LOW (ref 3.5–5.0)
Anion gap: 13 (ref 5–15)
BUN: 43 mg/dL — ABNORMAL HIGH (ref 6–20)
CO2: 25 mmol/L (ref 22–32)
Calcium: 9.5 mg/dL (ref 8.9–10.3)
Chloride: 98 mmol/L — ABNORMAL LOW (ref 101–111)
Creatinine, Ser: 11.22 mg/dL — ABNORMAL HIGH (ref 0.61–1.24)
GFR calc Af Amer: 5 mL/min — ABNORMAL LOW (ref >60)
GFR calc non Af Amer: 4 mL/min — ABNORMAL LOW (ref >60)
Glucose, Bld: 122 mg/dL — ABNORMAL HIGH (ref 65–99)
Phosphorus: 4.7 mg/dL — ABNORMAL HIGH (ref 2.5–4.6)
Potassium: 5.4 mmol/L — ABNORMAL HIGH (ref 3.5–5.1)
Sodium: 136 mmol/L (ref 135–145)

## 2016-11-03 LAB — CBC
HCT: 27.2 % — ABNORMAL LOW (ref 39.0–52.0)
Hemoglobin: 9.2 g/dL — ABNORMAL LOW (ref 13.0–17.0)
MCH: 29.9 pg (ref 26.0–34.0)
MCHC: 33.8 g/dL (ref 30.0–36.0)
MCV: 88.3 fL (ref 78.0–100.0)
Platelets: 173 K/uL (ref 150–400)
RBC: 3.08 MIL/uL — ABNORMAL LOW (ref 4.22–5.81)
RDW: 14.3 % (ref 11.5–15.5)
WBC: 5.3 K/uL (ref 4.0–10.5)

## 2016-11-03 MED ORDER — ISOSORB DINITRATE-HYDRALAZINE 20-37.5 MG PO TABS: 1.0000 | ORAL_TABLET | Freq: Three times a day (TID) | ORAL | 5 refills | Status: DC

## 2016-11-03 MED ORDER — SODIUM CHLORIDE 0.9 % IV SOLN: 100.0000 mL | INTRAVENOUS | Status: DC | PRN

## 2016-11-03 MED ORDER — PENTAFLUOROPROP-TETRAFLUOROETH EX AERO: 1.0000 "application " | INHALATION_SPRAY | CUTANEOUS | Status: DC | PRN

## 2016-11-03 MED ORDER — ASPIRIN 81 MG PO CHEW: 81.0000 mg | CHEWABLE_TABLET | Freq: Every day | ORAL | Status: DC

## 2016-11-03 MED ORDER — METOPROLOL TARTRATE 25 MG PO TABS: 25.0000 mg | ORAL_TABLET | Freq: Two times a day (BID) | ORAL | 5 refills | Status: DC

## 2016-11-03 MED ORDER — HEPARIN SODIUM (PORCINE) 1000 UNIT/ML DIALYSIS: 1900.0000 [IU] | Freq: Once | INTRAMUSCULAR | Status: DC

## 2016-11-03 MED ORDER — LIDOCAINE HCL (PF) 1 % IJ SOLN: 5.0000 mL | INTRAMUSCULAR | Status: DC | PRN

## 2016-11-03 MED ORDER — LIDOCAINE-PRILOCAINE 2.5-2.5 % EX CREA
1.0000 "application " | TOPICAL_CREAM | CUTANEOUS | Status: DC | PRN
  Filled 2016-11-03: qty 5

## 2016-11-03 NOTE — Care Management Note (Signed)
Case Management Note  Patient Details  Name: KOBEE MEDLEN MRN: [id number] Date of Birth: 20-Apr-1946  Subjective/Objective:  Pt presented for Nstemi. Hx ESRD, HD MWF. Plan for d/c home post HD. Pt has transportation via [place]. Pt is from home alone and has an aide 2 days per week for 3 hrs day. Pt usually gets his medications from the [place] mail order. Pt states he has a Therapist, occupational Aide [address] that he can get Rx's from. Recommendations for [facility]. Pt will need orders once stable for d/c for RN, PT, OT. Pt has used [facility] and [facility] in the past.                   Action/Plan: Pt wants to use [facility]- Referral made with Jerm[place]. SOC ot begin within 24-48 hours post d/c. No further needs from CM @ this time.  Expected Discharge Date:                  Expected Discharge Plan:  [place]  In-House Referral:  NA  Discharge planning Services  CM Consult  Post Acute Care Choice:  Home Health Choice offered to:  Patient  DME Arranged:  N/A DME Agency:  NA  HH Arranged:  RN, PT, OT HH Agency:  [place]  Status of Service:  Completed, signed off  If discussed at [place] of Stay Meetings, dates discussed:    Additional Comments:  Bethena Roys, RN 11/03/2016, 5:04 PM

## 2016-11-03 NOTE — Progress Notes (Signed)
Pt family is aware that he will be a late discharge and have agreed to come and pick patient up when his dialysis treatment is completed.

## 2016-11-03 NOTE — Evaluation (Signed)
Occupational Therapy Evaluation Patient Details Name: Trevor [name] MRN: [id number] DOB: 1946-03-03 Today's Date: 11/03/2016    History of Present Illness 63 yoM with hx of HTN, HLD, CAD, prior MI, ESRD on HD, who presented with 2 hours of severe 8/10 chest discomfort radiating to his L arm and associated with diaphoresis and shortness of breath.. Pt had a temporary R femoral cath, removed 12/3.   Clinical Impression   Pt admitted with above. He demonstrates the below listed deficits.  He anticipates home today with intermittent assist.  He fatigues quite rapidly with activity. He has rollator and w/c he uses at home, and reports he uses the w/c when he fatigues.  He requires min guard assist for ADLs and mod A for IADLs.  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      Mobility Bed Mobility Overal bed mobility: Needs Assistance Bed Mobility: Sit to Supine;Supine to Sit     Supine to sit: Supervision Sit to supine: Supervision      Transfers Overall transfer level: Needs assistance Equipment used: Rolling walker (2 wheeled) Transfers: Sit to/from [facility] Sit to Stand: Min guard Stand pivot transfers: Min guard            Balance Overall balance assessment: History of Falls                                          ADL Overall ADL's : Needs assistance/impaired Eating/Feeding: Modified independent   Grooming: Wash/dry hands;Wash/dry face;Oral care;Brushing hair;Set up;Supervision/safety;Sitting   Upper Body Bathing: Set up;Sitting   Lower Body Bathing: Min guard;Sit to/from stand   Upper Body Dressing : Set up;Sitting   Lower Body Dressing:  Min guard;Sit to/from stand   Toilet Transfer: Min guard;Ambulation;Comfort height toilet;Regular Toilet;Grab bars;RW   Toileting- Water quality scientist and Hygiene: Min guard;Sit to/from stand       Functional mobility during ADLs: Min guard;Rolling walker General ADL Comments: Pt fatigues quickly with activity      Vision     Perception     Praxis      Pertinent Vitals/Pain Pain Assessment: No/denies pain     Hand Dominance Right   Extremity/Trunk Assessment Upper Extremity Assessment Upper Extremity Assessment: RUE deficits/detail RUE Deficits / Details: limited shoulder ROM    Lower Extremity Assessment Lower Extremity Assessment: Defer to PT evaluation   Cervical / Trunk Assessment Cervical / Trunk Assessment: Normal   Communication Communication Communication: No difficulties   Cognition Arousal/Alertness: Awake/alert Behavior During Therapy: WFL for tasks assessed/performed                       General Comments       Exercises       Shoulder Instructions      Home Living Family/patient expects to be discharged to:: Private residence Living Arrangements: Alone Available Help at Discharge: Personal care attendant;Available PRN/intermittently Type of Home: House Home Access: Ramped entrance     Home Layout: One level     Bathroom Shower/Tub: Walk-in shower;Door   Technical brewer Accessibility:  Yes   Home Equipment: Walker - 4 wheels;Cane - single point;Bedside commode;Wheelchair - manual   Additional Comments: [place] aide 2x/wk, 3 hours, mobile meals      Prior Functioning/Environment Level of Independence: Needs assistance  Gait / Transfers Assistance Needed: uses rollator around house, w/c in community ADL's / Homemaking Assistance Needed: baths and dresses indep, every morning, uses shower seat   Comments: pt states home health aide helps with cleaning and takes him grocery shopping.  He reports he frequently  walks to convenient store for groceries.  He reports occasional falls        OT Problem List: Decreased strength;Decreased activity tolerance;Decreased knowledge of use of DME or AE;Impaired vision/perception   OT Treatment/Interventions:      OT Goals(Current goals can be found in the care plan section) Acute Rehab OT Goals Patient Stated Goal: home OT Goal Formulation: All assessment and education complete, DC therapy  OT Frequency:     Barriers to D/C:            Co-evaluation              End of Session Equipment Utilized During Treatment: Rolling walker;Gait belt Nurse Communication: Mobility status  Activity Tolerance: Patient limited by fatigue Patient left: in bed;with call bell/phone within reach;with bed alarm set   Time: 3833-3832 OT Time Calculation (min): 26 min Charges:  OT General Charges $OT Visit: 1 Procedure OT Evaluation $OT Eval Moderate Complexity: 1 Procedure OT Treatments $Self Care/Home Management : 8-22 mins G-Codes:    Amjad Fikes M 2016/11/06, 2:21 PM

## 2016-11-03 NOTE — Progress Notes (Addendum)
Dialysis treatment completed.  600 mL ultrafiltrated and net fluid removal 100 mL.    Patient status unchanged. Lung sounds clear to ausculation in all fields. No edema. Cardiac: NSR.  Disconnected lines and removed needles.  Pressure held for 10 minutes and band aid/gauze dressing applied.  Report given to bedside RN, Antoneo Ghrist.

## 2016-11-03 NOTE — Progress Notes (Signed)
   11/03/16 1015  Clinical Encounter Type  Visited With Patient  Visit Type Initial  Referral From Chaplain  Consult/Referral To Chaplain  Spiritual Encounters  Spiritual Needs Literature  Stress Factors  Patient Stress Factors Not reviewed  Advance Directives (For Healthcare)  Does Patient Have a Medical Advance Directive? No  Would patient like information on creating a medical advance directive? No - Patient declined  Pt. Declined an Ad, did not request Ad, is being discharged today.  Chaplain Advised Pt. That is he should need any further assistance to contact Pastoral Services.   [employer]   [phone]

## 2016-11-03 NOTE — Discharge Summary (Signed)
Discharge Summary    Patient ID: Trevor [name],  MRN: [id number], DOB/AGE: 05-03-1946 70 y.o.  Admit date: 11/01/2016 Discharge date: 11/03/2016  Primary Care Provider: [facility] Primary Cardiologist: Dr. Stanford Breed   Discharge Diagnoses    Principal Problem:   NSTEMI (non-ST elevated myocardial infarction) - Presumed Type II in setting of HTN Emergency Active Problems:   ESRD on hemodialysis [facility])   Hypertensive emergency   CAD (coronary artery disease)   Hyperlipidemia   Essential hypertension   Ischemic chest pain ([place])   Allergies No Known Allergies  Diagnostic Studies/Procedures   Emergent LHC 11/01/16 Coronary Findings   Dominance: Right  Left Anterior Descending  First Diagonal Branch  1st Diag lesion, 60% stenosed.  Left Circumflex  Prox Cx to Mid Cx lesion, 60% stenosed.     History of Present Illness     70 y/o male with hx of HTN, HLD, CAD, prior MI, ESRD on HD, who presented on 11/01/16 with 2 hours of severe 8/10 chest discomfort radiating to his L arm and associated with diaphoresis and shortness of breath.  En route patient received 325 ASA.  ECG showed STE in AVR and V1 with diffuse ST depressions consistent with diffuse ischemia vs prox LAD. CODE STEMI and cath lab activated en route.  Initial vitals were HR 115 with BP 210.    In [place]: Patient received IV heparin and 1 SL NTG, 25 po metoprolol.  Chest pain decreased to 2/10 with NTG, with improvement in BP and decrease in reciprocal ST depressions in inferior leads.  Hospital Course   Pt was transported to the cath lab for emergent LHC. Procedure was preformed by Dr. Gwenlyn Found. He was found to have noncritical CAD. His anatomy is unchanged from his prior cath in 2015 (details outlined above). It was believed that his chest pain was primarily related to HTN which improved with SL NTG. He left the cath lab in stable condition. He was transferred to CCU and eventually to telemetry  for BP management. He had no post cath complications. He was weaned off of IV nitro dip and his PO meds were adjusted. Metoprolol was increased to 25 mg BID. Bidil was added, 20-37.5 mg TID and nifedipine was continued at 90 mg daily. BP improved and was stable. He was also continued on ASA and Plavix for CAD. He is not on a statin. LDL is at goal at 60 mg/dL. Nephrology was consulted and assisted with HD prior to discharge. Pt was last seen and examined by Dr. Oval Linsey who determined he was stable for d/c after he completed HD. Post hospital f/u will be arranged with Dr. Stanford Breed or APP in 1-2 weeks. He will continue HD as an outpatient MWF.   Consultants: nephrology for HD   Discharge Vitals Blood pressure 127/60, pulse 72, temperature 97.6 F (36.4 C), temperature source Oral, resp. rate 18, height 5\' 10"  (1.778 m), weight 131 lb 11.2 oz (59.7 kg), SpO2 96 %.  Filed Weights   11/01/16 0000 11/01/16 0147 11/03/16 0449  Weight: 132 lb 7.9 oz (60.1 kg) 133 lb 9.6 oz (60.6 kg) 131 lb 11.2 oz (59.7 kg)    Labs & Radiologic Studies    CBC  Recent Labs  11/01/16 0012 11/01/16 0500  WBC 4.9 4.8  NEUTROABS 2.6  --   HGB 11.2* 9.8*  HCT 33.2* 29.1*  MCV 88.5 88.7  PLT 153 119*   Basic Metabolic Panel  Recent Labs  11/01/16 0012  11/01/16 0500  NA 140 139  K 4.3 4.8  CL 100* 99*  CO2 27 30  GLUCOSE 116* 73  BUN 9 11  CREATININE 4.27* 4.69*  CALCIUM 9.3 9.1   Liver Function Tests  Recent Labs  11/01/16 0012  AST 17  ALT 10*  ALKPHOS 72  BILITOT 0.5  PROT 6.9  ALBUMIN 3.4*   No results for input(s): LIPASE, AMYLASE in the last 72 hours. Cardiac Enzymes  Recent Labs  11/01/16 0012  TROPONINI 0.03*   BNP Invalid input(s): POCBNP D-Dimer No results for input(s): DDIMER in the last 72 hours. Hemoglobin A1C No results for input(s): HGBA1C in the last 72 hours. Fasting Lipid Panel  Recent Labs  11/01/16 0506  CHOL 111  HDL 33*  LDLCALC 60  TRIG 91  CHOLHDL  3.4   Thyroid Function Tests No results for input(s): TSH, T4TOTAL, T3FREE, THYROIDAB in the last 72 hours.  Invalid input(s): FREET3 _____________  Dg Chest Port 1 View  Result Date: 11/01/2016 CLINICAL DATA:  Acute onset of generalized chest pain and shortness of breath. Initial encounter. EXAM: PORTABLE CHEST 1 VIEW COMPARISON:  Chest radiograph from 09/07/2016 FINDINGS: The lungs are well-aerated. Pulmonary vascularity is at the upper limits of normal. Mild bibasilar atelectasis is seen. There is no evidence of pleural effusion or pneumothorax. The cardiomediastinal silhouette is within normal limits. No acute osseous abnormalities are seen. IMPRESSION: Mild bibasilar atelectasis noted.  Lungs otherwise grossly clear. Electronically Signed   By: Garald Balding M.D.   On: 11/01/2016 01:18   Disposition   Pt is being discharged home today in good condition.  Follow-up Plans & Appointments    Follow-up Information    Kirk Ruths, MD Follow up.   Specialty:  Cardiology Why:  our office will call you with a hospital follow-up visit  Contact information: [place] [address] [phone]        [facility] Follow up.   Why:  Registered Nurse, Physical / Occupational Therapy Contact information: [address] [place] [address] [phone]            Discharge Medications   Current Discharge Medication List    START taking these medications   Details  aspirin 81 MG chewable tablet Chew 1 tablet (81 mg total) by mouth daily.    isosorbide-hydrALAZINE (BIDIL) 20-37.5 MG tablet Take 1 tablet by mouth 3 (three) times daily. Qty: 90 tablet, Refills: 5      CONTINUE these medications which have CHANGED   Details  metoprolol tartrate (LOPRESSOR) 25 MG tablet Take 1 tablet (25 mg total) by mouth 2 (two) times daily. Qty: 60 tablet, Refills: 5      CONTINUE these medications which have NOT CHANGED   Details    atorvastatin (LIPITOR) 40 MG tablet Take 40 mg by mouth at bedtime.    calcium acetate (PHOSLO) 667 MG capsule Take 1,334 mg by mouth 3 (three) times daily before meals.     clopidogrel (PLAVIX) 75 MG tablet Take 1 tablet (75 mg total) by mouth daily. Qty: 30 tablet, Refills: 2    docusate sodium (COLACE) 100 MG capsule Take 200 mg by mouth daily.     gabapentin (NEURONTIN) 100 MG capsule Take 1 capsule (100 mg total) by mouth 3 (three) times daily. Qty: 90 capsule, Refills: 2    hydrALAZINE (APRESOLINE) 25 MG tablet Take 25 mg by mouth 3 (three) times daily.    magnesium hydroxide (MILK  OF MAGNESIA) 400 MG/5ML suspension Take 15 mLs by mouth daily as needed for mild constipation.    NIFEdipine (ADALAT CC) 90 MG 24 hr tablet Take 90 mg by mouth daily.    Omega-3 Fatty Acids (FISH OIL) 1000 MG CAPS Take 1,000 mg by mouth 2 (two) times daily.    OVER THE COUNTER MEDICATION Take 2 tablets by mouth at bedtime. Super Beta Prostate (vitamin D, Calcium, Zinc, Selenium, Copper, Manganese, Chromium, molybdenum, Phytosterols, Boron, Silica)         Aspirin prescribed at discharge?  Yes High Intensity Statin Prescribed? (Lipitor 40-80mg  or Crestor 20-40mg ): No: LDL <70 mg/dL. Beta Blocker Prescribed? Yes For EF <40%, was ACEI/ARB Prescribed? No: CKD ADP Receptor Inhibitor Prescribed? (i.e. Plavix etc.-Includes Medically Managed Patients): Yes For EF <40%, Aldosterone Inhibitor Prescribed? No: EF >40% Was EF assessed during THIS hospitalization? No:  Was Cardiac Rehab II ordered? (Included Medically managed Patients): No:    Outstanding Labs/Studies   None   Duration of Discharge Encounter   Greater than 30 minutes including physician time.  Signed, Lyda Jester PA-C 11/03/2016, 5:14 PM

## 2016-11-03 NOTE — Progress Notes (Signed)
Patient returned from dialysis. VSS BP 157 69, HR 81. No complaints of pain or discomfort. Called Dr Karlyn Agee to get discharge orders. Patient called his son and sonsaid it was to late to come get him and he would come in the morning. Patient disappointed but agreeable to staying.   Talia Hoheisel RN

## 2016-11-03 NOTE — Progress Notes (Signed)
Patient arrived to unit per bed.  Reviewed treatment plan and this RN agrees.  Report received from bedside RN, [place].  Consent obtained.  Patient A & O X 4. Lung sounds clear to ausculation in all fields. No edema. Cardiac: NSR.  Prepped LUAVF with alcohol and cannulated with two 15 gauge needles.  Pulsation of blood noted.  Flushed access well with saline per protocol.  Connected and secured lines and initiated tx at 1759.  UF goal of 600 mL and net fluid removal of 100 mL.  Will continue to monitor.

## 2016-11-03 NOTE — Progress Notes (Signed)
Patient Name: Trevor [name] Date of Encounter: 11/03/2016  Primary Cardiologist: Dr. Talmadge Chad Problem List     Principal Problem:   NSTEMI (non-ST elevated myocardial infarction) - Presumed Type II in setting of HTN Emergency Active Problems:   ESRD on hemodialysis [facility])   Hypertensive emergency   CAD (coronary artery disease)   Hyperlipidemia   Essential hypertension   Ischemic chest pain ([place])     Subjective   Feeling well. Denies chest pain or shortness of breath. He is ready to go home.  Inpatient Medications    Scheduled Meds: . aspirin  81 mg Oral Daily  . calcium acetate  1,334 mg Oral TID WC  . clopidogrel  75 mg Oral Daily  . docusate sodium  200 mg Oral Daily  . gabapentin  100 mg Oral TID  . isosorbide-hydrALAZINE  1 tablet Oral TID  . metoprolol tartrate  25 mg Oral BID  . NIFEdipine  90 mg Oral Daily  . sodium chloride flush  3 mL Intravenous Q12H   Continuous Infusions: . nitroGLYCERIN Stopped (11/01/16 1638)   PRN Meds: sodium chloride, acetaminophen, hydrALAZINE, morphine injection, ondansetron (ZOFRAN) IV, sodium chloride flush   Vital Signs    Vitals:   11/02/16 1353 11/02/16 1358 11/02/16 2115 11/03/16 0449  BP:  (!) 130/56 (!) 119/54 136/60  Pulse: 67  75 81  Resp:    16  Temp: 98.4 F (36.9 C)  98.8 F (37.1 C) 98.4 F (36.9 C)  TempSrc: Oral     SpO2: 96%   97%  Weight:    59.7 kg (131 lb 11.2 oz)  Height:        Intake/Output Summary (Last 24 hours) at 11/03/16 0857 Last data filed at 11/03/16 0300  Gross per 24 hour  Intake              680 ml  Output              200 ml  Net              480 ml   Filed Weights   11/01/16 0000 11/01/16 0147 11/03/16 0449  Weight: 60.1 kg (132 lb 7.9 oz) 60.6 kg (133 lb 9.6 oz) 59.7 kg (131 lb 11.2 oz)    Physical Exam   GEN: Well nourished, well developed, in no acute distress.  HEENT: Grossly normal.  Neck: Supple, no JVD, carotid bruits, or masses. Cardiac: RRR, no  murmurs, rubs, or gallops. No clubbing, cyanosis, edema.  Radials/DP/PT 2+ and equal bilaterally.  Respiratory:  Respirations regular and unlabored, clear to auscultation bilaterally. GI: Soft, nontender, nondistended, BS + x 4. MS: no deformity or atrophy. Skin: warm and dry, no rash. Neuro:  Strength and sensation are intact. Psych: AAOx3.  Normal affect.  Labs    CBC  Recent Labs  11/01/16 0012 11/01/16 0500  WBC 4.9 4.8  NEUTROABS 2.6  --   HGB 11.2* 9.8*  HCT 33.2* 29.1*  MCV 88.5 88.7  PLT 153 458*   Basic Metabolic Panel  Recent Labs  11/01/16 0012 11/01/16 0500  NA 140 139  K 4.3 4.8  CL 100* 99*  CO2 27 30  GLUCOSE 116* 73  BUN 9 11  CREATININE 4.27* 4.69*  CALCIUM 9.3 9.1   Liver Function Tests  Recent Labs  11/01/16 0012  AST 17  ALT 10*  ALKPHOS 72  BILITOT 0.5  PROT 6.9  ALBUMIN 3.4*   No results for input(s):  LIPASE, AMYLASE in the last 72 hours. Cardiac Enzymes  Recent Labs  11/01/16 0012  TROPONINI 0.03*   BNP Invalid input(s): POCBNP D-Dimer No results for input(s): DDIMER in the last 72 hours. Hemoglobin A1C No results for input(s): HGBA1C in the last 72 hours. Fasting Lipid Panel  Recent Labs  11/01/16 0506  CHOL 111  HDL 33*  LDLCALC 60  TRIG 91  CHOLHDL 3.4   Thyroid Function Tests No results for input(s): TSH, T4TOTAL, T3FREE, THYROIDAB in the last 72 hours.  Invalid input(s): FREET3  Telemetry    Sinus rhythm.  Occasional PVCs.  - Personally Reviewed  ECG   11/02/16: Sinus rhythm. Rate 69 bpm. Left ventricular hypertrophy with repolarization abnormalities.  Radiology    No results found.  Cardiac Studies   LHC 11/01/16:  Prox Cx to Mid Cx lesion, 60 %stenosed.  1st Diag lesion, 60 %stenosed.    Echo 12/28/15:  - Left ventricle: The cavity size was normal. There was moderate   concentric hypertrophy. Systolic function was mildly reduced. The   estimated ejection fraction was in the range of  45% to 50%. Mild   diffuse hypokinesis with no identifiable regional variations.   Doppler parameters are consistent with abnormal left ventricular   relaxation (grade 1 diastolic dysfunction). - Mitral valve: Calcified annulus. - Left atrium: The atrium was mildly dilated.  Patient Profile     Trevor [name] is a 70 year old man with CAD s/p MI, end-stage renal disease on hemodialysis, hypertension, hyperlipidemia here with hypertensive emergency and ST elevation myocardial infarction. He was found to have nonobstructive coronary disease on left heart catheterization.   Assessment & Plan    # CAD s/p STEMI: # Hypertensive emergency: Trevor [name] presented with hypertensive emergency and ST elevation concerning for myocardial infarction. Troponin was mildly elevated to 0.03. He was taken for cardiac catheterization where he was found to have nonobstructive coronary disease, with 60% lesions in the left circumflex and diagonal arteries. His blood pressure was elevated on admission and has improved substantially with the addition of Bidil to his medical regimen.  Continue aspirin, clopidogrel, Bidil, metoprolol, and nifedipine.  His LDL is 60 so he is not on statin.  # Acute systolic and diastolic heart failure: LVEF 45-50%.  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 As above, he had nonobstructive disease on cardiac catheterization. Blood pressure control as above. Consider adding an ACE-I/ARB as an outpatient.   # Dispo:  Home today after hemodialysis.   Signed, Trevor Latch, MD  11/03/2016, 8:57 AM

## 2016-11-03 NOTE — Evaluation (Signed)
Physical Therapy Evaluation Patient Details Name: Trevor [name] MRN: [id number] DOB: 1946/08/27 Today's Date: 11/03/2016   History of Present Illness  73 yoM with hx of HTN, HLD, CAD, prior MI, ESRD on HD, who presented with 2 hours of severe 8/10 chest discomfort radiating to his L arm and associated with diaphoresis and shortness of breath.. Pt had a temporary R femoral cath, removed 12/3.  Clinical Impression  Pt admitted with above. Pt with good home set up and has a home health aide. Pt indep with dressing and bathing and uses RW in house and w/c in community. Pt legally blind as well. Pt reports his R side feels weaker than normal. Pt tolerated amb well. Pt to benefit from HHPT to aide in improving R sided strength to increase stability and safety for indep mobility.    Follow Up Recommendations Home health PT;Supervision - Intermittent    Equipment Recommendations  None recommended by PT    Recommendations for Other Services       Precautions / Restrictions Precautions Precautions: Fall Precaution Comments: pt legally blind, no vision out of R eye, limited in L Restrictions Weight Bearing Restrictions: No      Mobility  Bed Mobility               General bed mobility comments: pt sitting up EOB upon PT arrival  Transfers Overall transfer level: Needs assistance Equipment used: Rolling walker (2 wheeled) Transfers: Sit to/from Stand Sit to Stand: Min guard         General transfer comment: pt with good hand placement, min guard due to first time getting up  Ambulation/Gait Ambulation/Gait assistance: Min guard Ambulation [name] (Feet): 100 Feet Assistive device: Rolling walker (2 wheeled) Gait Pattern/deviations: Step-through pattern;Decreased stride length;Trunk flexed;Narrow base of support Gait velocity: slow and steady Gait velocity interpretation: Below normal speed for age/gender General Gait Details: pt with decreased R LE step height, 1  standing rest break, no episodes of LOB  Stairs            Wheelchair Mobility    Modified Rankin (Stroke Patients Only)       Balance Overall balance assessment: No apparent balance deficits (not formally assessed) (but does require UE support for safe standing)                                           Pertinent Vitals/Pain Pain Assessment: No/denies pain    Home Living Family/patient expects to be discharged to:: Private residence Living Arrangements: Alone Available Help at Discharge: Personal care attendant;Available PRN/intermittently Type of Home: House Home Access: Ramped entrance     Home Layout: One level Home Equipment: [place] - 4 wheels;Cane - single point;Bedside commode;Wheelchair - manual Additional Comments: HH aide 2x/wk, 3 hours, mobile meals    Prior Function Level of Independence: Needs assistance   Gait / Transfers Assistance Needed: uses rollator around house, w/c in community  ADL's / Homemaking Assistance Needed: baths and dresses indep, every morning, uses shower seat  Comments: pt states home health aide helps with cleaning and takes him grocery shopping     Hand Dominance   Dominant Hand: Right    Extremity/Trunk Assessment   Upper Extremity Assessment: RUE deficits/detail RUE Deficits / Details: limited shoulder flexion, active to 45 deg, grossly 3/5         Lower Extremity Assessment: RLE deficits/detail RLE  Deficits / Details: grossly 3+/5, reports numbness on bottom of foot    Cervical / Trunk Assessment: Normal  Communication   Communication: No difficulties  Cognition Arousal/Alertness: Awake/alert Behavior During Therapy: WFL for tasks assessed/performed Overall Cognitive Status: Within Functional Limits for tasks assessed                      General Comments      Exercises     Assessment/Plan    PT Assessment Patient needs continued PT services  PT Problem List Decreased  strength;Decreased range of motion;Decreased activity tolerance;Decreased balance;Decreased mobility          PT Treatment Interventions DME instruction;Gait training;Functional mobility training;Therapeutic activities;Therapeutic exercise;Balance training    PT Goals (Current goals can be found in the Care Plan section)  Acute Rehab PT Goals Patient Stated Goal: home PT Goal Formulation: With patient Time For Goal Achievement: 11/10/16 Potential to Achieve Goals: Good    Frequency Min 3X/week   Barriers to discharge        Co-evaluation               End of Session Equipment Utilized During Treatment: Gait belt Activity Tolerance: Patient tolerated treatment well Patient left: in chair;with call bell/phone within reach Nurse Communication: Mobility status         Time: 9741-6384 PT Time Calculation (min) (ACUTE ONLY): 34 min   Charges:   PT Evaluation $PT Eval Moderate Complexity: 1 Procedure PT Treatments $Gait Training: 8-22 mins   PT G CodesKoleen [name] Trevor [name] 11/03/2016, 8:33 AM   Trevor [name], PT, DPT Pager #: [phone] Office #: [phone]

## 2016-11-04 NOTE — Progress Notes (Signed)
Pt discharged home. Discharge instructions have been gone over with the patient. IV's removed. Pt given unit number and told to call if they have any concerns regarding their discharge instructions. Saleena Tamas V, RN   

## 2016-11-04 NOTE — Discharge Instructions (Signed)
Heart Attack °A heart attack (myocardial infarction, MI) causes damage to your heart that cannot be fixed. A heart attack can happen when a heart (coronary) artery becomes blocked or narrowed. This cuts off the blood supply and oxygen to your heart. °When one or more of your coronary arteries becomes blocked, that area of your heart begins to die. This causes the pain you feel during a heart attack. Heart attack pain can also occur in one part of the body but be felt in another part of the body (referred pain). You may feel referred heart attack pain in your left arm, neck, or jaw. Pain may even be felt in the right arm. °What are the causes? °Many conditions can cause a heart attack. These include: °· Atherosclerosis. This is when a fatty substance (plaque) gradually builds up in the arteries. This buildup can block or reduce the blood supply to one or more of the heart arteries. °· A blood clot. A blood clot can develop suddenly when plaque breaks up (ruptures) within a heart artery. A blood clot can block the heart artery, which prevents blood flow to the heart. °· Severe tightening (spasm) of the heart artery. This cuts off blood flow through the artery. °What increases the risk? °  °People at risk for heart attack usually have one or more of the following risk factors: °· High blood pressure (hypertension). °· High cholesterol. °· Smoking. °· Being male. °· Being overweight or obese. °· Older aged. °· A family history of heart disease. °· Lack of exercise. °· Diabetes. °· Stress. °· Drinking too much alcohol. °· Using illegal street drugs, such as cocaine and methamphetamines. °What are the signs or symptoms? °Heart attack symptoms can vary from person to person. Symptoms depend on factors like gender and age. °· In both men and women, heart attack symptoms can include the following: °¨ Chest pain. This may feel like crushing, squeezing, or a feeling of pressure. °¨ Shortness of breath. °¨ Heartburn or  indigestion with or without vomiting, shortness of breath, or sweating. °¨ Sudden cold sweats. °¨ Sudden light-headedness. °¨ Upper back pain. °· Women can have unique heart attack symptoms, such as: °¨ Unexplained feelings of nervousness or anxiety. °¨ Discomfort between the shoulder blades or upper back. °¨ Tingling in the hands and arms. °· Older people (of both genders) can have subtle heart attack symptoms, such as: °¨ Sweating. °¨ Shortness of breath. °¨ General tiredness or not feeling well. °How is this diagnosed? °Diagnosing a heart attack involves several tests. They include: °· An assessment of your vital signs. This includes checking your: °¨ Heart rhythm. °¨ Blood pressure. °¨ Breathing rate. °¨ Oxygen level. °· An ECG (electrocardiogram) to measure the electrical activity of your heart. °· Blood tests called cardiac markers. In these tests, blood is drawn at scheduled times to check for the specific proteins or enzymes released by damaged heart muscle. °· A chest X-ray. °· An echocardiogram to evaluate heart motion and blood flow. °· Coronary angiography to look at the heart arteries. °How is this treated? °Treatment for a heart attack may include: °· Medicine that breaks up or dissolves blood clots in the heart artery. °· Angioplasty. °· Cardiac stent placement. °· Intra-aortic balloon pump therapy (IABP). °· Open heart surgery (coronary artery bypass graft, CABG). °Follow these instructions at home: °· Take medicines only as directed by your health care provider. You may need to take medicine after a heart attack to: °¨ Keep your blood from clotting   too easily. °¨ Control your blood pressure. °¨ Lower your cholesterol. °¨ Control abnormal heart rhythms. °· Do not take the following medicines unless your health care provider approves: °¨ Nonsteroidal anti-inflammatory drugs (NSAIDs), such as ibuprofen, naproxen, or celecoxib. °¨ Vitamin supplements that contain vitamin A, vitamin E, or  both. °¨ Hormone replacement therapy that contains estrogen with or without progestin. °· Make lifestyle changes as directed by your health care provider. These may include: °¨ Quitting smoking, if you smoke. °¨ Getting regular exercise. Ask your health care provider to suggest some activities that are safe for you. °¨ Eating a heart-healthy diet. A registered dietitian can help you learn healthy eating options. °¨ Maintaining a healthy weight. °¨ Managing diabetes, if necessary. °¨ Reducing stress. °¨ Limiting how much alcohol you drink. °Get help right away if: °· You have sudden, unexplained chest discomfort. °· You have sudden, unexplained discomfort in your arms, back, neck, or jaw. °· You have shortness of breath at any time. °· You suddenly start to sweat or your skin gets clammy. °· You feel nauseous or vomit. °· You suddenly feel light-headed or dizzy. °· Your heart begins to beat fast or feels like it is skipping beats. °These symptoms may represent a serious problem that is an emergency. Do not wait to see if the symptoms will go away. Get medical help right away. Call your local emergency services (911 in the [place] Do not drive yourself to the hospital.  °This information is not intended to replace advice given to you by your health care provider. Make sure you discuss any questions you have with your health care provider. °Document Released: 11/17/2005 Document Revised: 04/24/2016 Document Reviewed: 01/20/2014 °Elsevier Interactive Patient Education © 2017 [employer] ° °

## 2016-11-11 ENCOUNTER — Telehealth: Payer: Self-pay | Admitting: Cardiology

## 2016-11-11 DIAGNOSIS — I214 Non-ST elevation (NSTEMI) myocardial infarction: Secondary | ICD-10-CM | POA: Diagnosis not present

## 2016-11-11 DIAGNOSIS — Z720 Tobacco use: Secondary | ICD-10-CM | POA: Diagnosis not present

## 2016-11-11 DIAGNOSIS — N186 End stage renal disease: Secondary | ICD-10-CM | POA: Diagnosis not present

## 2016-11-11 DIAGNOSIS — I252 Old myocardial infarction: Secondary | ICD-10-CM | POA: Diagnosis not present

## 2016-11-11 DIAGNOSIS — H548 Legal blindness, as defined in USA: Secondary | ICD-10-CM | POA: Diagnosis not present

## 2016-11-11 DIAGNOSIS — Z992 Dependence on renal dialysis: Secondary | ICD-10-CM | POA: Diagnosis not present

## 2016-11-11 DIAGNOSIS — F419 Anxiety disorder, unspecified: Secondary | ICD-10-CM | POA: Diagnosis not present

## 2016-11-11 DIAGNOSIS — I429 Cardiomyopathy, unspecified: Secondary | ICD-10-CM | POA: Diagnosis not present

## 2016-11-11 DIAGNOSIS — I2510 Atherosclerotic heart disease of native coronary artery without angina pectoris: Secondary | ICD-10-CM | POA: Diagnosis not present

## 2016-11-11 DIAGNOSIS — E785 Hyperlipidemia, unspecified: Secondary | ICD-10-CM | POA: Diagnosis not present

## 2016-11-11 DIAGNOSIS — M150 Primary generalized (osteo)arthritis: Secondary | ICD-10-CM | POA: Diagnosis not present

## 2016-11-11 DIAGNOSIS — I69398 Other sequelae of cerebral infarction: Secondary | ICD-10-CM | POA: Diagnosis not present

## 2016-11-11 DIAGNOSIS — I120 Hypertensive chronic kidney disease with stage 5 chronic kidney disease or end stage renal disease: Secondary | ICD-10-CM | POA: Diagnosis not present

## 2016-11-11 NOTE — Telephone Encounter (Signed)
[facility] nurse notified of Dr Stanford Breed message below

## 2016-11-11 NOTE — Telephone Encounter (Signed)
Spoke with Cecille Rubin, Nurse with [facility]. She states that she is confused about pt medications. She states that she is looking at the discharge instructions and Hydralazine is on there twice, reg hydralazine and with Isosorbide she states that she will discontinue the regular hydralazine and have pt take the isosorbide-hydralazine 20-37.5mg  tablet three times daily. Also pt was prescribed Amlodipine 10mg  daily by the [place] and this is not on the discharge instructions, should she continue? Please advise

## 2016-11-11 NOTE — Telephone Encounter (Signed)
Please continue meds as listed on DC summary except hydralazine (pt should be on bidil); not on amlodipine; on procardia per DC summary Trevor [name]

## 2016-11-13 ENCOUNTER — Telehealth: Payer: Self-pay | Admitting: Cardiology

## 2016-11-13 ENCOUNTER — Emergency Department (HOSPITAL_COMMUNITY)
Admission: EM | Admit: 2016-11-13 | Discharge: 2016-11-13 | Disposition: A | Discharge status: Home / Self Care | Payer: Medicare Other | Attending: Emergency Medicine | Admitting: Emergency Medicine

## 2016-11-13 ENCOUNTER — Encounter (HOSPITAL_COMMUNITY): Payer: Self-pay

## 2016-11-13 DIAGNOSIS — N186 End stage renal disease: Secondary | ICD-10-CM | POA: Insufficient documentation

## 2016-11-13 DIAGNOSIS — Z8673 Personal history of transient ischemic attack (TIA), and cerebral infarction without residual deficits: Secondary | ICD-10-CM | POA: Diagnosis not present

## 2016-11-13 DIAGNOSIS — Z79899 Other long term (current) drug therapy: Secondary | ICD-10-CM | POA: Diagnosis not present

## 2016-11-13 DIAGNOSIS — I69398 Other sequelae of cerebral infarction: Secondary | ICD-10-CM | POA: Diagnosis not present

## 2016-11-13 DIAGNOSIS — I132 Hypertensive heart and chronic kidney disease with heart failure and with stage 5 chronic kidney disease, or end stage renal disease: Secondary | ICD-10-CM | POA: Insufficient documentation

## 2016-11-13 DIAGNOSIS — Z992 Dependence on renal dialysis: Secondary | ICD-10-CM | POA: Diagnosis not present

## 2016-11-13 DIAGNOSIS — Z7982 Long term (current) use of aspirin: Secondary | ICD-10-CM | POA: Diagnosis not present

## 2016-11-13 DIAGNOSIS — I252 Old myocardial infarction: Secondary | ICD-10-CM | POA: Diagnosis not present

## 2016-11-13 DIAGNOSIS — R55 Syncope and collapse: Secondary | ICD-10-CM | POA: Diagnosis not present

## 2016-11-13 DIAGNOSIS — I5043 Acute on chronic combined systolic (congestive) and diastolic (congestive) heart failure: Secondary | ICD-10-CM | POA: Diagnosis not present

## 2016-11-13 DIAGNOSIS — F17210 Nicotine dependence, cigarettes, uncomplicated: Secondary | ICD-10-CM | POA: Insufficient documentation

## 2016-11-13 DIAGNOSIS — R404 Transient alteration of awareness: Secondary | ICD-10-CM | POA: Diagnosis not present

## 2016-11-13 DIAGNOSIS — I2510 Atherosclerotic heart disease of native coronary artery without angina pectoris: Secondary | ICD-10-CM | POA: Diagnosis not present

## 2016-11-13 DIAGNOSIS — I120 Hypertensive chronic kidney disease with stage 5 chronic kidney disease or end stage renal disease: Secondary | ICD-10-CM | POA: Diagnosis not present

## 2016-11-13 DIAGNOSIS — I214 Non-ST elevation (NSTEMI) myocardial infarction: Secondary | ICD-10-CM | POA: Diagnosis not present

## 2016-11-13 DIAGNOSIS — R42 Dizziness and giddiness: Secondary | ICD-10-CM | POA: Diagnosis not present

## 2016-11-13 DIAGNOSIS — R531 Weakness: Secondary | ICD-10-CM | POA: Diagnosis not present

## 2016-11-13 DIAGNOSIS — H548 Legal blindness, as defined in USA: Secondary | ICD-10-CM | POA: Diagnosis not present

## 2016-11-13 LAB — I-STAT CHEM 8, ED
BUN: 22 mg/dL — AB (ref 6–20)
CHLORIDE: 100 mmol/L — AB (ref 101–111)
CREATININE: 6.9 mg/dL — AB (ref 0.61–1.24)
Calcium, Ion: 1 mmol/L — ABNORMAL LOW (ref 1.15–1.40)
Glucose, Bld: 93 mg/dL (ref 65–99)
HEMATOCRIT: 28 % — AB (ref 39.0–52.0)
Hemoglobin: 9.5 g/dL — ABNORMAL LOW (ref 13.0–17.0)
Potassium: 4.2 mmol/L (ref 3.5–5.1)
SODIUM: 138 mmol/L (ref 135–145)
TCO2: 28 mmol/L (ref 0–100)

## 2016-11-13 LAB — CBC WITH DIFFERENTIAL/PLATELET
BASOS PCT: 2 %
Basophils Absolute: 0.1 10*3/uL (ref 0.0–0.1)
EOS ABS: 0.4 10*3/uL (ref 0.0–0.7)
Eosinophils Relative: 7 %
HEMATOCRIT: 29.9 % — AB (ref 39.0–52.0)
HEMOGLOBIN: 10.1 g/dL — AB (ref 13.0–17.0)
LYMPHS ABS: 1.7 10*3/uL (ref 0.7–4.0)
Lymphocytes Relative: 31 %
MCH: 29.7 pg (ref 26.0–34.0)
MCHC: 33.8 g/dL (ref 30.0–36.0)
MCV: 87.9 fL (ref 78.0–100.0)
MONOS PCT: 11 %
Monocytes Absolute: 0.6 10*3/uL (ref 0.1–1.0)
NEUTROS ABS: 2.7 10*3/uL (ref 1.7–7.7)
NEUTROS PCT: 49 %
Platelets: 186 10*3/uL (ref 150–400)
RBC: 3.4 MIL/uL — AB (ref 4.22–5.81)
RDW: 13.7 % (ref 11.5–15.5)
WBC: 5.5 10*3/uL (ref 4.0–10.5)

## 2016-11-13 LAB — I-STAT TROPONIN, ED: Troponin i, poc: 0.05 ng/mL (ref 0.00–0.08)

## 2016-11-13 NOTE — Discharge Instructions (Signed)
Please take your blood pressure regularly.  Followup with your doctor for further care.  Return if you have any concerns.

## 2016-11-13 NOTE — ED Provider Notes (Signed)
[facility] DEPT Provider Note   CSN: [id number] Arrival date & time: 11/13/16  1613     History   Chief Complaint Chief Complaint  Patient presents with  . Dizziness    HPI Trevor [name] is a 70 y.o. male.  HPI   70 year old male with history of CAD, end-stage renal disease on M/W/F dialysis, hypertension, hyperlipidemia, blindness, recently admitted for non-STEMI and was discharge 10 days ago presenting complaining of dizziness. Patient brought here via EMS from home. Patient report he finishes 4 hours dialysis session yesterday, came home and was tired. The next day he thought that he have a doctor's appointment, therefore he did not eat his breakfast nor take his usual blood pressure medication. He returned home but did not have an opportunity to eat his lunch when his physical therapist show up for the first time. The physical therapist asked if he has taken his blood pressure medication, and encouraged patient to take his medication prior to the start of her physical therapy. Patient took the medication on an empty stomach. After 30 minutes of physical therapy, patient reported feeling lightheadedness and weak and attributed to the blood pressure medication as well as feeling hungry. He asked the physical therapist if he can just rest for a bit, however his physical therapist called 911 and patient was brought here. At this time, patient states he feels better. He denies any severe headache, neck stiffness, chest pain, difficulty breathing, abdominal pain, focal numbness or weakness. He does report being hungry. He does not want any further evaluation.  Past Medical History:  Diagnosis Date  . Anxiety   . Arthritis   . Blindness   . Cardiomyopathy   . Chest pain    heart cath 2015, no intervention (LCX 60-80%) otherwise nonobst disease  . 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 ESRD on hemodialysis ([place])    started HD 2014-15    . Headache(784.0)   . HTN (hypertension)   . Myocardial infarction   . Shortness of breath     Patient Active Problem List   Diagnosis Date Noted  . Ischemic chest pain ([place]) 11/01/2016  . ST elevation myocardial infarction (STEMI) ([place])   . Hypoxemia 09/07/2016  . Essential hypertension 03/08/2016  . Orthostatic hypotension 01/12/2016  . Hyperlipidemia 01/12/2016  . Chest pain at rest   . ESRD (end stage renal disease) ([place])   . Cough   . Weakness generalized 12/26/2015  . Hypertensive heart disease with CHF (congestive heart failure) ([place])   . Chest pain 08/12/2015  . Malignant hypertension 08/12/2015  . Carotid stenosis   . TIA (transient ischemic attack) 04/04/2015  . Weakness 04/03/2015  . Stroke ([place]) 04/03/2015  . Abdominal aortic aneurysm ([place]) 03/06/2015  . Generalized weakness 09/08/2014  . Acute encephalopathy 09/07/2014  . CAD (coronary artery disease) 08/08/2014  . NSTEMI (non-ST elevated myocardial infarction) - Presumed Type II in setting of HTN Emergency 07/07/2014  . Hypertensive emergency 07/05/2014  . Acute on chronic combined systolic and diastolic CHF (congestive heart failure) ([place]) 07/05/2014  . Protein-calorie malnutrition, severe ([place]) 05/20/2014  . Hypokalemia 05/19/2014  . Syncope and collapse 05/19/2014  . ESRD on hemodialysis ([place]) 05/19/2014  . Anemia of renal disease 05/19/2014  . Coronary artery disease, non-occlusive: Cath 2013 - Moderate D1 & RCA disease 09/28/2012  . Anemia 09/27/2012  . Secondary hyperparathyroidism ([place]) 09/27/2012  . Non compliance with medical treatment 09/27/2012  .  Chronic combined systolic and diastolic CHF (congestive heart failure) ([place]) 10/10/2011  . A-fib ([place]) 10/08/2011  . History of stroke 10/07/2011  . Cocaine abuse 10/07/2011  . Severe sinus bradycardia 10/07/2011  . Nonischemic cardiomyopathy ([place]) 09/18/2011  . Tobacco use disorder 08/07/2011  . Bruit 08/07/2011    Past Surgical History:   Procedure Laterality Date  . CARDIAC CATHETERIZATION N/A 11/01/2016   Procedure: Left Heart Cath and Coronary Angiography;  Surgeon: Lorretta Harp, MD;  Location: [facility];  Service: Cardiovascular;  Laterality: N/A;  . INSERTION OF DIALYSIS CATHETER  09/21/2012   Procedure: INSERTION OF DIALYSIS CATHETER;  Surgeon: Angelia Mould, MD;  Location: [facility];  Service: Vascular;  Laterality: N/A;  Right Internal Jugular Placement  . LEFT HEART CATHETERIZATION WITH CORONARY ANGIOGRAM N/A 09/28/2012   Procedure: LEFT HEART CATHETERIZATION WITH CORONARY ANGIOGRAM;  Surgeon: Sherren Mocha, MD;  Location: [facility] CATH LAB;  Service: Cardiovascular;  Laterality: N/A;  . LEFT HEART CATHETERIZATION WITH CORONARY ANGIOGRAM N/A 07/07/2014   Procedure: LEFT HEART CATHETERIZATION WITH CORONARY ANGIOGRAM;  Surgeon: Leonie Man, MD;  Location: [facility] CATH LAB;  Service: Cardiovascular;  Laterality: N/A;  . right hand    . TEE WITHOUT CARDIOVERSION  10/09/2011   Procedure: TRANSESOPHAGEAL ECHOCARDIOGRAM (TEE);  Surgeon: Lelon Perla, MD;  Location: [facility] ENDOSCOPY;  Service: Cardiovascular;  Laterality: N/A;       Home Medications    Prior to Admission medications   Medication Sig Start Date End Date Taking? Authorizing Provider  aspirin 81 MG chewable tablet Chew 1 tablet (81 mg total) by mouth daily. 11/04/16   Brittainy Erie Noe, PA-C  atorvastatin (LIPITOR) 40 MG tablet Take 40 mg by mouth at bedtime.    Historical Provider, MD  calcium acetate (PHOSLO) 667 MG capsule Take 1,334 mg by mouth 3 (three) times daily before meals.     Historical Provider, MD  clopidogrel (PLAVIX) 75 MG tablet Take 1 tablet (75 mg total) by mouth daily. 04/05/15   Barton Dubois, MD  docusate sodium (COLACE) 100 MG capsule Take 200 mg by mouth daily.     Historical Provider, MD  gabapentin (NEURONTIN) 100 MG capsule Take 1 capsule (100 mg total) by mouth 3 (three) times daily. 04/05/15   Barton Dubois, MD   isosorbide-hydrALAZINE (BIDIL) 20-37.5 MG tablet Take 1 tablet by mouth 3 (three) times daily. 11/03/16   Brittainy Erie Noe, PA-C  magnesium hydroxide (MILK OF MAGNESIA) 400 MG/5ML suspension Take 15 mLs by mouth daily as needed for mild constipation.    Historical Provider, MD  metoprolol tartrate (LOPRESSOR) 25 MG tablet Take 1 tablet (25 mg total) by mouth 2 (two) times daily. 11/03/16   Brittainy Erie Noe, PA-C  NIFEdipine (ADALAT CC) 90 MG 24 hr tablet Take 90 mg by mouth daily.    Historical Provider, MD  Omega-3 Fatty Acids (FISH OIL) 1000 MG CAPS Take 1,000 mg by mouth 2 (two) times daily.    Historical Provider, MD  OVER THE COUNTER MEDICATION Take 2 tablets by mouth at bedtime. Super Beta Prostate (vitamin D, Calcium, Zinc, Selenium, Copper, Manganese, Chromium, molybdenum, Phytosterols, Boron, Silica)    Historical Provider, MD    Family History Family History  Problem Relation Age of Onset  . Heart attack Mother     MI in her 40s  . Diabetes Mother   . Alcohol abuse Father   . Anesthesia problems Neg Hx   . Hypotension Neg Hx   . Malignant hyperthermia Neg  Hx   . Pseudochol deficiency Neg Hx     Social History Social History  Substance Use Topics  . Smoking status: Current Some Day Smoker    Packs/day: 0.50    Years: 50.00    Types: Cigarettes  . Smokeless tobacco: Never Used  . Alcohol use No     Comment: Occasional     Allergies   Patient has no known allergies.   Review of Systems Review of Systems  All other systems reviewed and are negative.    Physical Exam Updated Vital Signs BP 126/64 (BP Location: Right Arm)   Pulse 61   Temp 98.6 F (37 C) (Oral)   Resp 18   Ht 5\' 10"  (1.778 m)   Wt 59.4 kg   SpO2 100%   BMI 18.80 kg/m   Physical Exam  Constitutional: He is oriented to person, place, and time. He appears well-developed and well-nourished. No distress.  HENT:  Head: Atraumatic.  Mouth/Throat: Oropharynx is clear and moist.  Eyes:  Conjunctivae are normal.  Neck: Neck supple.  Cardiovascular: Normal rate, regular rhythm and intact distal pulses.   Pulmonary/Chest: Effort normal and breath sounds normal.  Abdominal: Soft. There is no tenderness.  Neurological: He is alert and oriented to person, place, and time.  Able to move all 4 extremities with equal strength.  Skin: No rash noted.  Psychiatric: He has a normal mood and affect.  Nursing note and vitals reviewed.    ED Treatments / Results  Labs (all labs ordered are listed, but only abnormal results are displayed) Labs Reviewed  CBC WITH DIFFERENTIAL/PLATELET - Abnormal; Notable for the following:       Result Value   RBC 3.40 (*)    Hemoglobin 10.1 (*)    HCT 29.9 (*)    All other components within normal limits  I-STAT CHEM 8, ED - Abnormal; Notable for the following:    Chloride 100 (*)    BUN 22 (*)    Creatinine, Ser 6.90 (*)    Calcium, Ion 1.00 (*)    Hemoglobin 9.5 (*)    HCT 28.0 (*)    All other components within normal limits  I-STAT TROPOININ, ED    EKG  EKG Interpretation  Date/Time:  Thursday November 13 2016 16:15:52 EST Ventricular Rate:  62 PR Interval:    QRS Duration: 94 QT Interval:  446 QTC Calculation: 453 R Axis:   48 Text Interpretation:  Sinus rhythm Probable LVH with secondary repol abnrm No significant change since last tracing Confirmed by LITTLE MD, RACHEL ([id number]) on 11/13/2016 4:25:25 PM       Radiology No results found.  Procedures Procedures (including critical care time)  Medications Ordered in ED Medications - No data to display   Initial Impression / Assessment and Plan / ED Course  I have reviewed the triage vital signs and the nursing notes.  Pertinent labs & imaging results that were available during my care of the patient were reviewed by me and considered in my medical decision making (see chart for details).  Clinical Course     BP 173/74   Pulse 70   Temp 98.6 F (37 C) (Oral)    Resp 18   Ht 5\' 10"  (1.778 m)   Wt 59.4 kg   SpO2 100%   BMI 18.80 kg/m    Final Clinical Impressions(s) / ED Diagnoses   Final diagnoses:  Near syncope    New Prescriptions New Prescriptions   No medications  on file   4:54 PM Patient presents with transient weakness after he took metoprolol, and nitroglycerin on an empty stomach and having to participate in physical therapies for the first time after having an NSTEMI a week prior.  He is currently back to his baseline, blood pressure is stable.  7:14 PM Labs are at baseline.  Pt felt better.  Stable for discharge.  Care discussed with DR. Little.  Return precaution given.    Domenic Moras, PA-C 11/13/16 [place], MD 11/14/16 [phone]

## 2016-11-13 NOTE — Telephone Encounter (Signed)
New message  Pt was sent to hospital with low BP, after taking BP meds, when he had high BP  Pt had a  heart attack 10 days ago  Can call back for more details

## 2016-11-13 NOTE — ED Triage Notes (Signed)
Pt brought in by [facility]. Pt home aid called ambulance stating that pt was c/o dizziness and was diaphoretic. Aid told EMS that pt was hypotensive. Pt BP 126/62 with EMS. When patient was asked to stand pt became dizzy and EMS was unable to get new standing Bp. Pt states he is not feeling dizzy at the moment and that he it started after he took his home metoprolol and isosorbide. Pt also has hx of ESRD.

## 2016-11-14 NOTE — Telephone Encounter (Signed)
Spoke with PT, they went to the patients home for the first time yesterday. When they arrived he was not there but came up pushing his wheelchair reporting he had walked to the store. They report he was fatigued. His bo was 170/84 but the patient had not taken his medications. He took his meds and in about 10 to 15 min he became weak, dizzy and had cold sweats. They called 911 and they took the patient to the ER. I called the patient, he is currently at dialysis and does not know what his bp is. He reports he has taken one of his medications this morning. He will call back after dialysis to discuss low bp.

## 2016-11-14 NOTE — Telephone Encounter (Signed)
Requesting call back about patient.

## 2016-11-18 ENCOUNTER — Telehealth: Payer: Self-pay | Admitting: Cardiology

## 2016-11-18 NOTE — Telephone Encounter (Signed)
Pt is requesting delay in physical therapy until after Christmas. His reason is due to multiple medical appointments.

## 2016-11-18 NOTE — Telephone Encounter (Signed)
Returned call to Herbert Deaner PT with [facility].He was calling to let Dr.Crenshaw know patient wants to start PT after Christmas.

## 2016-11-19 ENCOUNTER — Telehealth: Payer: Self-pay | Admitting: Cardiovascular Disease

## 2016-11-19 NOTE — Telephone Encounter (Signed)
For Order: OT 1 W 2 OT Orders:  OT Evaluation 11/13/16  Order # :  [id number]  Signed by Dr. Gwenlyn Found and faxed back to number provided.

## 2016-11-20 ENCOUNTER — Telehealth: Payer: Self-pay | Admitting: Cardiology

## 2016-11-20 ENCOUNTER — Ambulatory Visit: Payer: Medicare Other | Admitting: Cardiology

## 2016-11-20 ENCOUNTER — Encounter: Payer: Self-pay | Admitting: *Deleted

## 2016-11-20 DIAGNOSIS — N186 End stage renal disease: Secondary | ICD-10-CM | POA: Diagnosis not present

## 2016-11-20 DIAGNOSIS — I2510 Atherosclerotic heart disease of native coronary artery without angina pectoris: Secondary | ICD-10-CM | POA: Diagnosis not present

## 2016-11-20 DIAGNOSIS — I120 Hypertensive chronic kidney disease with stage 5 chronic kidney disease or end stage renal disease: Secondary | ICD-10-CM | POA: Diagnosis not present

## 2016-11-20 DIAGNOSIS — I214 Non-ST elevation (NSTEMI) myocardial infarction: Secondary | ICD-10-CM | POA: Diagnosis not present

## 2016-11-20 DIAGNOSIS — I69398 Other sequelae of cerebral infarction: Secondary | ICD-10-CM | POA: Diagnosis not present

## 2016-11-20 DIAGNOSIS — H548 Legal blindness, as defined in USA: Secondary | ICD-10-CM | POA: Diagnosis not present

## 2016-11-20 NOTE — Telephone Encounter (Signed)
Returned call to patient he stated he already scheduled appointment with [name] PA 11/25/16 at 2:00 pm.Advised to bring a list of all medications.

## 2016-11-20 NOTE — Telephone Encounter (Signed)
New Message  Pt call requesting to speak with RN.. Pt states he did not take his medication today 12/21. Per pt states when he takes his medications prescribed by Dr. Stanford Breed it makes his drowsy. Please call back to discuss

## 2016-11-21 ENCOUNTER — Telehealth: Payer: Self-pay | Admitting: Cardiology

## 2016-11-21 NOTE — Telephone Encounter (Signed)
Did not need this encounter °

## 2016-11-25 ENCOUNTER — Ambulatory Visit (INDEPENDENT_AMBULATORY_CARE_PROVIDER_SITE_OTHER): Payer: Medicare Other | Admitting: Cardiology

## 2016-11-25 ENCOUNTER — Telehealth: Payer: Self-pay | Admitting: Cardiology

## 2016-11-25 ENCOUNTER — Encounter: Payer: Self-pay | Admitting: Cardiology

## 2016-11-25 DIAGNOSIS — N186 End stage renal disease: Secondary | ICD-10-CM | POA: Diagnosis not present

## 2016-11-25 DIAGNOSIS — I119 Hypertensive heart disease without heart failure: Secondary | ICD-10-CM | POA: Diagnosis not present

## 2016-11-25 DIAGNOSIS — I120 Hypertensive chronic kidney disease with stage 5 chronic kidney disease or end stage renal disease: Secondary | ICD-10-CM | POA: Diagnosis not present

## 2016-11-25 DIAGNOSIS — Z992 Dependence on renal dialysis: Secondary | ICD-10-CM

## 2016-11-25 DIAGNOSIS — I714 Abdominal aortic aneurysm, without rupture, unspecified: Secondary | ICD-10-CM

## 2016-11-25 DIAGNOSIS — H548 Legal blindness, as defined in USA: Secondary | ICD-10-CM | POA: Diagnosis not present

## 2016-11-25 DIAGNOSIS — I2510 Atherosclerotic heart disease of native coronary artery without angina pectoris: Secondary | ICD-10-CM

## 2016-11-25 DIAGNOSIS — I214 Non-ST elevation (NSTEMI) myocardial infarction: Secondary | ICD-10-CM | POA: Diagnosis not present

## 2016-11-25 DIAGNOSIS — I161 Hypertensive emergency: Secondary | ICD-10-CM | POA: Diagnosis not present

## 2016-11-25 DIAGNOSIS — I69398 Other sequelae of cerebral infarction: Secondary | ICD-10-CM | POA: Diagnosis not present

## 2016-11-25 NOTE — Patient Instructions (Signed)
Medication Instructions:   TAKE NIFEDIPINE ONLY ON NON-DIALYSIS DAYS  Follow-Up:  Your physician recommends that you schedule a follow-up appointment in: [place]

## 2016-11-25 NOTE — Telephone Encounter (Signed)
[name] pa-c made aware

## 2016-11-25 NOTE — Assessment & Plan Note (Signed)
EF 45-50% with moderate LVH and grade 1 DD by echo Jan 2017

## 2016-11-25 NOTE — Assessment & Plan Note (Signed)
[facility] since 2014[facility] Col[place] follows. 

## 2016-11-25 NOTE — Assessment & Plan Note (Signed)
Pt admitted with HTN and EKG changes 11/01/16. Cath moderate non obstructive CAD.

## 2016-11-25 NOTE — Telephone Encounter (Signed)
New message     FYI Nurse is at the home of the patient.  She want to let the doctor know that he has not been taking his medication.  Pill box still has medication in it from several days ago (at least a week).  Pt lives alone and he told the nurse that sometimes he does not feel well after dialysis and she thinks that is why he is not taking his medication.  Nurse says he needs a Education officer, museum to come out and evaluate him to get more help.

## 2016-11-25 NOTE — Assessment & Plan Note (Signed)
60% Dx1 and 60% CFX at cath 2013-2015-and Dec 2017 

## 2016-11-25 NOTE — Progress Notes (Signed)
11/25/2016 Trevor [name]   1946/03/28  [id number]  Primary Physician [facility] Primary Cardiologist: Dr Stanford Breed  HPI:  70 y/o AA male, lives alone, on HD MWF, with a history of of labile B/P. His pressure alternates from too high to too low. He frequently doesn't take medications on dialysis days because he feels weak. He was recently admitted with hypertensive urgency 11/01/16. Initially it was thought he was having a STEMI. Cath revealed 60% CFX and 60 Dx1. This was unchanged from prior caths done in 2013 and 2015. He was back in the ED 11/13/16 after an episode of hypotension. He is in the office today for follow up The home health RN has reported in the p[ast that he has missed medications based on his pill box. The pt admits he sometime self adjusts medications based on how he feels. The pt presents to the office today in a wheel chair. He denies chest pain or SOB, just overall fatigue. His B/P is 100/57.   Current Outpatient Prescriptions  Medication Sig Dispense Refill  . aspirin 81 MG chewable tablet Chew 1 tablet (81 mg total) by mouth daily.    Marland Kitchen atorvastatin (LIPITOR) 40 MG tablet Take 40 mg by mouth at bedtime.    . calcium acetate (PHOSLO) 667 MG capsule Take 1,334 mg by mouth 3 (three) times daily before meals.     . clopidogrel (PLAVIX) 75 MG tablet Take 1 tablet (75 mg total) by mouth daily. 30 tablet 2  . docusate sodium (COLACE) 100 MG capsule Take 200 mg by mouth daily.     Marland Kitchen gabapentin (NEURONTIN) 100 MG capsule Take 1 capsule (100 mg total) by mouth 3 (three) times daily. 90 capsule 2  . isosorbide-hydrALAZINE (BIDIL) 20-37.5 MG tablet Take 1 tablet by mouth 3 (three) times daily. 90 tablet 5  . magnesium hydroxide (MILK OF MAGNESIA) 400 MG/5ML suspension Take 15 mLs by mouth daily as needed for mild constipation.    . metoprolol tartrate (LOPRESSOR) 25 MG tablet Take 1 tablet (25 mg total) by mouth 2 (two) times daily. 60 tablet 5  . NIFEdipine (ADALAT  CC) 90 MG 24 hr tablet Take 90 mg by mouth daily.    . Omega-3 Fatty Acids (FISH OIL) 1000 MG CAPS Take 1,000 mg by mouth 2 (two) times daily.    Marland Kitchen OVER THE COUNTER MEDICATION Take 2 tablets by mouth at bedtime. Super Beta Prostate (vitamin D, Calcium, Zinc, Selenium, Copper, Manganese, Chromium, molybdenum, Phytosterols, Boron, Silica)     No current facility-administered medications for this visit.     No Known Allergies  Social History   Social History  . Marital status: Divorced    Spouse name: N/A  . Number of children: 2  . Years of education: N/A   Occupational History  . Not on file.   Social History Main Topics  . Smoking status: Current Some Day Smoker    Packs/day: 0.50    Years: 50.00    Types: Cigarettes  . Smokeless tobacco: Never Used  . Alcohol use No     Comment: Occasional  . Drug use: No  . Sexual activity: Yes   Other Topics Concern  . Not on file   Social History Narrative  . No narrative on file     Review of Systems: General: negative for chills, fever, night sweats or weight changes.  Cardiovascular: negative for chest pain, dyspnea on exertion, edema, orthopnea, palpitations, paroxysmal nocturnal dyspnea or shortness of breath  Dermatological: negative for rash Respiratory: negative for cough or wheezing Urologic: negative for hematuria Abdominal: negative for nausea, vomiting, diarrhea, bright red blood per rectum, melena, or hematemesis Neurologic: negative for visual changes, syncope, or dizziness All other systems reviewed and are otherwise negative except as noted above.    Blood pressure (!) 100/57, pulse 79, height 5\' 10"  (1.778 m), weight 130 lb 9.6 oz (59.2 kg).  General appearance: alert, cooperative, cachectic and no distress Lungs: clear to auscultation bilaterally Heart: regular rate and rhythm Skin: Skin color, texture, turgor normal. No rashes or lesions Neurologic: Grossly normal   ASSESSMENT AND PLAN:   Hypertensive  emergency Pt admitted with HTN and EKG changes 11/01/16. Cath moderate non obstructive CAD.  Hypertensive cardiovascular disease EF 45-50% with moderate LVH and grade 1 DD by echo Jan 2017  Coronary artery disease, non-occlusive:  60% Dx1 and 60% CFX at cath 2013-2015-and Dec 2017  ESRD on hemodialysis [facility]) HD since 2014, MWF at [place]- Dr Marval Regal follows.   PLAN  I suggested he take his Nifedipine on off dialysis days. He probably isn't taking his BiDil three times a day but encouraged to take it at least 3 times a day.   [name] PA-C 11/25/2016 2:17 PM

## 2016-11-26 ENCOUNTER — Telehealth: Payer: Self-pay | Admitting: Cardiology

## 2016-11-26 NOTE — Telephone Encounter (Signed)
Pt needs a Education officer, museum. The aide is doing a good job,she is afraid something is going to happen to him. Pt can not remember to take his medicine.

## 2016-11-26 NOTE — Telephone Encounter (Signed)
Ok  Jaymen Fetch  

## 2016-11-26 NOTE — Telephone Encounter (Signed)
Spoke with Pamala Hurry, Education officer, museum order given.

## 2016-11-26 NOTE — Telephone Encounter (Signed)
Will forward for dr Stanford Breed review and okay to order

## 2016-12-02 DIAGNOSIS — N186 End stage renal disease: Secondary | ICD-10-CM | POA: Diagnosis not present

## 2016-12-02 DIAGNOSIS — I214 Non-ST elevation (NSTEMI) myocardial infarction: Secondary | ICD-10-CM | POA: Diagnosis not present

## 2016-12-02 DIAGNOSIS — I120 Hypertensive chronic kidney disease with stage 5 chronic kidney disease or end stage renal disease: Secondary | ICD-10-CM | POA: Diagnosis not present

## 2016-12-02 DIAGNOSIS — H548 Legal blindness, as defined in USA: Secondary | ICD-10-CM | POA: Diagnosis not present

## 2016-12-02 DIAGNOSIS — I69398 Other sequelae of cerebral infarction: Secondary | ICD-10-CM | POA: Diagnosis not present

## 2016-12-02 DIAGNOSIS — I2510 Atherosclerotic heart disease of native coronary artery without angina pectoris: Secondary | ICD-10-CM | POA: Diagnosis not present

## 2016-12-04 DIAGNOSIS — I2510 Atherosclerotic heart disease of native coronary artery without angina pectoris: Secondary | ICD-10-CM | POA: Diagnosis not present

## 2016-12-04 DIAGNOSIS — N186 End stage renal disease: Secondary | ICD-10-CM | POA: Diagnosis not present

## 2016-12-04 DIAGNOSIS — I214 Non-ST elevation (NSTEMI) myocardial infarction: Secondary | ICD-10-CM | POA: Diagnosis not present

## 2016-12-04 DIAGNOSIS — H548 Legal blindness, as defined in USA: Secondary | ICD-10-CM | POA: Diagnosis not present

## 2016-12-04 DIAGNOSIS — I120 Hypertensive chronic kidney disease with stage 5 chronic kidney disease or end stage renal disease: Secondary | ICD-10-CM | POA: Diagnosis not present

## 2016-12-04 DIAGNOSIS — I69398 Other sequelae of cerebral infarction: Secondary | ICD-10-CM | POA: Diagnosis not present

## 2016-12-05 ENCOUNTER — Telehealth: Payer: Self-pay | Admitting: Cardiovascular Disease

## 2016-12-05 NOTE — Telephone Encounter (Signed)
Received order: OT 1 W 1  Stating pt wanted to reschedule OT for after Christmas due to several medical appointments. Extending orders to reassess and determine need for OT services.   Reviewed and signed by Dr. Gwenlyn Found and faxed to nomber provided.

## 2016-12-07 ENCOUNTER — Inpatient Hospital Stay (HOSPITAL_COMMUNITY)
Admission: EM | Admit: 2016-12-07 | Discharge: 2016-12-11 | DRG: 304 | Disposition: A | Discharge status: Skilled Nursing Facility | Payer: Medicare Other | Attending: Internal Medicine | Admitting: Internal Medicine

## 2016-12-07 ENCOUNTER — Encounter (HOSPITAL_COMMUNITY): Payer: Self-pay | Admitting: Emergency Medicine

## 2016-12-07 ENCOUNTER — Other Ambulatory Visit: Payer: Self-pay

## 2016-12-07 DIAGNOSIS — I248 Other forms of acute ischemic heart disease: Secondary | ICD-10-CM | POA: Diagnosis present

## 2016-12-07 DIAGNOSIS — E8889 Other specified metabolic disorders: Secondary | ICD-10-CM | POA: Diagnosis present

## 2016-12-07 DIAGNOSIS — I6529 Occlusion and stenosis of unspecified carotid artery: Secondary | ICD-10-CM | POA: Diagnosis present

## 2016-12-07 DIAGNOSIS — Z811 Family history of alcohol abuse and dependence: Secondary | ICD-10-CM

## 2016-12-07 DIAGNOSIS — F17210 Nicotine dependence, cigarettes, uncomplicated: Secondary | ICD-10-CM | POA: Diagnosis present

## 2016-12-07 DIAGNOSIS — I252 Old myocardial infarction: Secondary | ICD-10-CM

## 2016-12-07 DIAGNOSIS — Z7901 Long term (current) use of anticoagulants: Secondary | ICD-10-CM

## 2016-12-07 DIAGNOSIS — Z79899 Other long term (current) drug therapy: Secondary | ICD-10-CM

## 2016-12-07 DIAGNOSIS — N2581 Secondary hyperparathyroidism of renal origin: Secondary | ICD-10-CM | POA: Diagnosis not present

## 2016-12-07 DIAGNOSIS — I5040 Unspecified combined systolic (congestive) and diastolic (congestive) heart failure: Secondary | ICD-10-CM | POA: Diagnosis present

## 2016-12-07 DIAGNOSIS — I132 Hypertensive heart and chronic kidney disease with heart failure and with stage 5 chronic kidney disease, or end stage renal disease: Secondary | ICD-10-CM | POA: Diagnosis present

## 2016-12-07 DIAGNOSIS — I160 Hypertensive urgency: Secondary | ICD-10-CM | POA: Diagnosis not present

## 2016-12-07 DIAGNOSIS — I2510 Atherosclerotic heart disease of native coronary artery without angina pectoris: Secondary | ICD-10-CM | POA: Diagnosis present

## 2016-12-07 DIAGNOSIS — D631 Anemia in chronic kidney disease: Secondary | ICD-10-CM | POA: Diagnosis present

## 2016-12-07 DIAGNOSIS — R778 Other specified abnormalities of plasma proteins: Secondary | ICD-10-CM

## 2016-12-07 DIAGNOSIS — Z992 Dependence on renal dialysis: Secondary | ICD-10-CM

## 2016-12-07 DIAGNOSIS — Z8249 Family history of ischemic heart disease and other diseases of the circulatory system: Secondary | ICD-10-CM

## 2016-12-07 DIAGNOSIS — R7989 Other specified abnormal findings of blood chemistry: Secondary | ICD-10-CM

## 2016-12-07 DIAGNOSIS — Z833 Family history of diabetes mellitus: Secondary | ICD-10-CM

## 2016-12-07 DIAGNOSIS — Z7982 Long term (current) use of aspirin: Secondary | ICD-10-CM

## 2016-12-07 DIAGNOSIS — I714 Abdominal aortic aneurysm, without rupture: Secondary | ICD-10-CM | POA: Diagnosis present

## 2016-12-07 DIAGNOSIS — H5461 Unqualified visual loss, right eye, normal vision left eye: Secondary | ICD-10-CM | POA: Diagnosis present

## 2016-12-07 DIAGNOSIS — R079 Chest pain, unspecified: Secondary | ICD-10-CM | POA: Diagnosis not present

## 2016-12-07 DIAGNOSIS — B1920 Unspecified viral hepatitis C without hepatic coma: Secondary | ICD-10-CM | POA: Diagnosis present

## 2016-12-07 DIAGNOSIS — Z8673 Personal history of transient ischemic attack (TIA), and cerebral infarction without residual deficits: Secondary | ICD-10-CM

## 2016-12-07 DIAGNOSIS — I4891 Unspecified atrial fibrillation: Secondary | ICD-10-CM | POA: Diagnosis present

## 2016-12-07 DIAGNOSIS — R0789 Other chest pain: Secondary | ICD-10-CM | POA: Diagnosis not present

## 2016-12-07 DIAGNOSIS — N186 End stage renal disease: Secondary | ICD-10-CM | POA: Diagnosis present

## 2016-12-07 DIAGNOSIS — E785 Hyperlipidemia, unspecified: Secondary | ICD-10-CM | POA: Diagnosis present

## 2016-12-07 HISTORY — DX: Unspecified protein-calorie malnutrition: E46

## 2016-12-07 HISTORY — DX: Heart failure, unspecified: I50.9

## 2016-12-07 HISTORY — DX: Abdominal aortic aneurysm, without rupture, unspecified: I71.40

## 2016-12-07 HISTORY — DX: Abdominal aortic aneurysm, without rupture: I71.4

## 2016-12-07 LAB — BASIC METABOLIC PANEL
ANION GAP: 13 (ref 5–15)
BUN: 32 mg/dL — ABNORMAL HIGH (ref 6–20)
CHLORIDE: 105 mmol/L (ref 101–111)
CO2: 24 mmol/L (ref 22–32)
Calcium: 9.5 mg/dL (ref 8.9–10.3)
Creatinine, Ser: 8.96 mg/dL — ABNORMAL HIGH (ref 0.61–1.24)
GFR calc non Af Amer: 5 mL/min — ABNORMAL LOW (ref >60)
GFR, EST AFRICAN AMERICAN: 6 mL/min — AB (ref >60)
Glucose, Bld: 82 mg/dL (ref 65–99)
POTASSIUM: 4 mmol/L (ref 3.5–5.1)
SODIUM: 142 mmol/L (ref 135–145)

## 2016-12-07 LAB — CBC WITH DIFFERENTIAL/PLATELET
BASOS ABS: 0.1 10*3/uL (ref 0.0–0.1)
BASOS PCT: 1 %
Eosinophils Absolute: 0.5 10*3/uL (ref 0.0–0.7)
Eosinophils Relative: 8 %
HEMATOCRIT: 26.7 % — AB (ref 39.0–52.0)
HEMOGLOBIN: 9 g/dL — AB (ref 13.0–17.0)
Lymphocytes Relative: 31 %
Lymphs Abs: 1.9 10*3/uL (ref 0.7–4.0)
MCH: 29.7 pg (ref 26.0–34.0)
MCHC: 33.7 g/dL (ref 30.0–36.0)
MCV: 88.1 fL (ref 78.0–100.0)
MONOS PCT: 6 %
Monocytes Absolute: 0.4 10*3/uL (ref 0.1–1.0)
NEUTROS ABS: 3.2 10*3/uL (ref 1.7–7.7)
NEUTROS PCT: 54 %
Platelets: 170 10*3/uL (ref 150–400)
RBC: 3.03 MIL/uL — AB (ref 4.22–5.81)
RDW: 13.3 % (ref 11.5–15.5)
WBC: 6 10*3/uL (ref 4.0–10.5)

## 2016-12-07 LAB — I-STAT TROPONIN, ED: TROPONIN I, POC: 0.06 ng/mL (ref 0.00–0.08)

## 2016-12-07 NOTE — ED Notes (Signed)
Restrictions band (pink) placed on pt's left wrist.

## 2016-12-07 NOTE — ED Triage Notes (Addendum)
Pt presents via EMS with chest pain that started approximately 830 while at rest.  Pt goes to dialysis on M-W-F and went Friday as scheduled.  Pain was 6/10 and pt was hypertensive upon arrival of fire and EMS.  Pt was given 1 nitro and pain decreased to 2/10 and BP reduced to 184/89.  NSR on the monitor. Pt is blind in his right eye and has limited vision in the left eye.

## 2016-12-07 NOTE — ED Provider Notes (Signed)
[facility] DEPT Provider Note   CSN: [id number] Arrival date & time: 12/07/16  2140     History   Chief Complaint Chief Complaint  Patient presents with  . Chest Pain    HPI Trevor [name] is a 71 y.o. male.  HPI   Patient is a 71 year old male with history of CVA, hypertension, TIA, AAA, A. fib, CHF, MI, CAD (on ASA and Plavix), ESRD on HD (M/W/F) who presents to the ED via EMS from home with complaint of CP, onset 8pm. Patient reports while he was sitting on his couch he had sudden onset of heavy chest pressure to the left side of his chest that radiated to his left neck. Denies any aggravating or alleviating factors. Denies radiation. Patient reports calling EMS and states his blood pressure was elevated at 208/100. Patient was given sublingual nitroglycerin by EMS and reports complete resolution of his chest pain. Patient reports his chest pain felt similar to when he had a heart attack in the past. Denies fever, chills, headache, lightheadedness, dizziness, cough, shortness of breath, wheezing, palpitations, abdominal pain, nausea, vomiting, numbness, tingling, weakness, diaphoresis. Pt reports going to dialysis on Friday as scheduled. Reports taking his home medications as prescribed however he is unable to state whether he is on anticoagulants or took aspirin today.   Cardiologist- Dr. Stanford Breed  Past Medical History:  Diagnosis Date  . Anxiety   . Arthritis   . Blindness   . Cardiomyopathy   . Chest pain    heart cath 2015, no intervention (LCX 60-80%) otherwise nonobst disease  . 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 ESRD on hemodialysis ([place])    started HD 2014-15  . Headache(784.0)   . HTN (hypertension)   . Myocardial infarction   . Shortness of breath     Patient Active Problem List   Diagnosis Date Noted  . 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 Ischemic chest pain ([place]) 11/01/2016  . Hypoxemia  09/07/2016  . Essential hypertension 03/08/2016  . Orthostatic hypotension 01/12/2016  . Dyslipidemia 01/12/2016  . Cough   . Weakness generalized 12/26/2015  . Chest pain 08/12/2015  . Malignant hypertension 08/12/2015  . Carotid stenosis   . Stroke ([place]) 04/03/2015  . Abdominal aortic aneurysm ([place]) 03/06/2015  . Generalized weakness 09/08/2014  . Acute encephalopathy 09/07/2014  . Hypertensive emergency 07/05/2014  . Protein-calorie malnutrition, severe ([place]) 05/20/2014  . Hypokalemia 05/19/2014  . Syncope and collapse 05/19/2014  . ESRD on hemodialysis ([place]) 05/19/2014  . Anemia of renal disease 05/19/2014  . Coronary artery disease, non-occlusive:  09/28/2012  . Anemia 09/27/2012  . Secondary hyperparathyroidism ([place]) 09/27/2012  . Non compliance with medical treatment 09/27/2012  . Chronic combined systolic and diastolic CHF (congestive heart failure) ([place]) 10/10/2011  . A-fib ([place]) 10/08/2011  . History of stroke 10/07/2011  . Cocaine abuse 10/07/2011  . Severe sinus bradycardia 10/07/2011  . Nonischemic cardiomyopathy ([place]) 09/18/2011  . Tobacco use disorder 08/07/2011  . Bruit 08/07/2011    Past Surgical History:  Procedure Laterality Date  . CARDIAC CATHETERIZATION N/A 11/01/2016   Procedure: Left Heart Cath and Coronary Angiography;  Surgeon: Lorretta Harp, MD;  Location: [facility] CV LAB;  Service: Cardiovascular;  Laterality: N/A;  . INSERTION OF DIALYSIS CATHETER  09/21/2012   Procedure: INSERTION OF DIALYSIS CATHETER;  Surgeon: Angelia Mould, MD;  Location: [facility];  Service: Vascular;  Laterality: N/A;  Right Internal  Jugular Placement  . LEFT HEART CATHETERIZATION WITH CORONARY ANGIOGRAM N/A 09/28/2012   Procedure: LEFT HEART CATHETERIZATION WITH CORONARY ANGIOGRAM;  Surgeon: Sherren Mocha, MD;  Location: [facility] CATH LAB;  Service: Cardiovascular;  Laterality: N/A;  . LEFT HEART CATHETERIZATION WITH CORONARY ANGIOGRAM N/A 07/07/2014   Procedure: LEFT  HEART CATHETERIZATION WITH CORONARY ANGIOGRAM;  Surgeon: Leonie Man, MD;  Location: [facility] CATH LAB;  Service: Cardiovascular;  Laterality: N/A;  . right hand    . TEE WITHOUT CARDIOVERSION  10/09/2011   Procedure: TRANSESOPHAGEAL ECHOCARDIOGRAM (TEE);  Surgeon: Lelon Perla, MD;  Location[facility] ENDOSCOPY;  Service: Cardiovascular;  Laterality: N/A;       Home Medications    Prior to Admission medications   Medication Sig Start Date End Date Taking? Authorizing Provider  aspirin 81 MG chewable tablet Chew 1 tablet (81 mg total) by mouth daily. 11/04/16  Yes Brittainy Erie Noe, PA-C  atorvastatin (LIPITOR) 40 MG tablet Take 40 mg by mouth at bedtime.   Yes Historical Provider, MD  calcium acetate (PHOSLO) 667 MG capsule Take 1,334 mg by mouth 3 (three) times daily before meals.    Yes Historical Provider, MD  clopidogrel (PLAVIX) 75 MG tablet Take 1 tablet (75 mg total) by mouth daily. 04/05/15  Yes Barton Dubois, MD  docusate sodium (COLACE) 100 MG capsule Take 200 mg by mouth daily.    Yes Historical Provider, MD  Elbasvir-Grazoprevir (ZEPATIER) 50-100 MG TABS Take 1 tablet by mouth daily.   Yes Historical Provider, MD  gabapentin (NEURONTIN) 100 MG capsule Take 1 capsule (100 mg total) by mouth 3 (three) times daily. 04/05/15  Yes Barton Dubois, MD  isosorbide-hydrALAZINE (BIDIL) 20-37.5 MG tablet Take 1 tablet by mouth 3 (three) times daily. 11/03/16  Yes Brittainy Erie Noe, PA-C  magnesium hydroxide (MILK OF MAGNESIA) 400 MG/5ML suspension Take 15 mLs by mouth daily as needed for mild constipation.   Yes Historical Provider, MD  metoprolol tartrate (LOPRESSOR) 25 MG tablet Take 1 tablet (25 mg total) by mouth 2 (two) times daily. 11/03/16  Yes Brittainy Erie Noe, PA-C  NIFEdipine (ADALAT CC) 90 MG 24 hr tablet Take 90 mg by mouth daily.   Yes Historical Provider, MD  Omega-3 Fatty Acids (FISH OIL) 1000 MG CAPS Take 1,000 mg by mouth 2 (two) times daily.   Yes Historical Provider, MD  OVER  THE COUNTER MEDICATION Take 2 tablets by mouth at bedtime. Super Beta Prostate (vitamin D, Calcium, Zinc, Selenium, Copper, Manganese, Chromium, molybdenum, Phytosterols, Boron, Silica)   Yes Historical Provider, MD    Family History Family History  Problem Relation Age of Onset  . Heart attack Mother     MI in her 21s  . Diabetes Mother   . Alcohol abuse Father   . Anesthesia problems Neg Hx   . Hypotension Neg Hx   . Malignant hyperthermia Neg Hx   . Pseudochol deficiency Neg Hx     Social History Social History  Substance Use Topics  . Smoking status: Current Some Day Smoker    Packs/day: 0.50    Years: 50.00    Types: Cigarettes  . Smokeless tobacco: Never Used  . Alcohol use No     Comment: Occasional     Allergies   Patient has no known allergies.   Review of Systems Review of Systems  Cardiovascular: Positive for chest pain.  All other systems reviewed and are negative.    Physical Exam Updated Vital Signs BP 186/71 (BP Location: Right Arm)  Pulse 66   Temp 98.6 F (37 C) (Oral)   Resp 16   SpO2 96%   Physical Exam  Constitutional: He is oriented to person, place, and time. He appears well-developed and well-nourished. No distress.  HENT:  Head: Normocephalic and atraumatic.  Mouth/Throat: Uvula is midline, oropharynx is clear and moist and mucous membranes are normal. No oropharyngeal exudate, posterior oropharyngeal edema, posterior oropharyngeal erythema or tonsillar abscesses. No tonsillar exudate.  Eyes: Conjunctivae and EOM are normal. Right eye exhibits no discharge. Left eye exhibits no discharge. No scleral icterus.  Neck: Normal range of motion. Neck supple.  Cardiovascular: Normal rate, regular rhythm, normal heart sounds and intact distal pulses.   Pulmonary/Chest: Effort normal and breath sounds normal. No respiratory distress. He has no wheezes. He has no rales. He exhibits no tenderness.  Abdominal: Soft. Bowel sounds are normal. He  exhibits no distension and no mass. There is no tenderness. There is no rebound and no guarding. No hernia.  Musculoskeletal: Normal range of motion. He exhibits no edema or tenderness.  Lymphadenopathy:    He has no cervical adenopathy.  Neurological: He is alert and oriented to person, place, and time.  Skin: Skin is warm and dry. He is not diaphoretic.  Nursing note and vitals reviewed.    ED Treatments / Results  Labs (all labs ordered are listed, but only abnormal results are displayed) Labs Reviewed  CBC WITH DIFFERENTIAL/PLATELET - Abnormal; Notable for the following:       Result Value   RBC 3.03 (*)    Hemoglobin 9.0 (*)    HCT 26.7 (*)    All other components within normal limits  BASIC METABOLIC PANEL - Abnormal; Notable for the following:    BUN 32 (*)    Creatinine, Ser 8.96 (*)    GFR calc non Af Amer 5 (*)    GFR calc Af Amer 6 (*)    All other components within normal limits  I-STAT TROPOININ, ED    EKG  EKG Interpretation  Date/Time:  Sunday December 07 2016 21:32:29 EST Ventricular Rate:  68 PR Interval:  158 QRS Duration: 96 QT Interval:  416 QTC Calculation: 442 R Axis:   29 Text Interpretation:  Normal sinus rhythm Left ventricular hypertrophy with repolarization abnormality Abnormal ECG Confirmed by Lacinda Axon  MD, BRIAN ([id number]) on 12/07/2016 10:47:14 PM Also confirmed by Lacinda Axon  MD, BRIAN ([id number])  on 12/07/2016 10:48:06 PM       Radiology No results found.  Procedures Procedures (including critical care time)  Medications Ordered in ED Medications - No data to display   Initial Impression / Assessment and Plan / ED Course  I have reviewed the triage vital signs and the nursing notes.  Pertinent labs & imaging results that were available during my care of the patient were reviewed by me and considered in my medical decision making (see chart for details).  Clinical Course     Patient presents with sharp left-sided chest pain that radiated to his  neck that started while he was sitting on his Zofran at home. Patient was given sublingual nitroglycerin by EMS with complete resolution of his chest pain. Patient currently denies any pain or complaints. Patient reports initially having elevated blood pressure when evaluated by EMS, 200/100. Patient reports taking his home medications as prescribed. VSS. Exam unremarkable. Patient does not appear to be in fluid overload.  Chart review shows pt was admitted on 11/01/16 for STEMI. Cath performed by Dr. Gwenlyn Found, found  to have noncritical CAD, exam unchanged from prior cath in 2015 with 60% stenosis of CFX and Dx1. CP thought to be related to hypertensive urgency which improved with SL NTG. Metoprolol increased to 25mg  BID, Bidil 29-37.5mg  TID added and Nifedipine continued at 90mg  QD. Pt followed up with cardiology outpatient on 11/25/16, home health nurse reported pt missed medications based on his pill box. Last echo performed on January 2017 showing EF 45-50%.   EKG showed sinus rhythm, LVH, no significant changes from prior. Troponin negative. Discussed case with Dr. Leonides Schanz who also evaluated pt, pt has remained pain free. Plan to order delta trop. Consulted cardiology. Dr. Kenton Kingfisher agrees with plan of ordering delta trop and d/c pt home with outpatient cardiology (Dr. Stanford Breed) follow up this week if his trop is negative.   Hand-off to Dr. Leonides Schanz. Delta trop pending.   Final Clinical Impressions(s) / ED Diagnoses   Final diagnoses:  None    New Prescriptions New Prescriptions   No medications on file        Nona Dell, PA-C 12/07/16 [place], DO 12/08/16 [place], DO 12/08/16 [id number]

## 2016-12-08 ENCOUNTER — Encounter (HOSPITAL_COMMUNITY): Payer: Self-pay | Admitting: General Practice

## 2016-12-08 ENCOUNTER — Emergency Department (HOSPITAL_COMMUNITY): Payer: Medicare Other

## 2016-12-08 DIAGNOSIS — R079 Chest pain, unspecified: Secondary | ICD-10-CM | POA: Diagnosis not present

## 2016-12-08 DIAGNOSIS — E8889 Other specified metabolic disorders: Secondary | ICD-10-CM | POA: Diagnosis present

## 2016-12-08 DIAGNOSIS — N400 Enlarged prostate without lower urinary tract symptoms: Secondary | ICD-10-CM | POA: Diagnosis not present

## 2016-12-08 DIAGNOSIS — N2581 Secondary hyperparathyroidism of renal origin: Secondary | ICD-10-CM | POA: Diagnosis present

## 2016-12-08 DIAGNOSIS — I120 Hypertensive chronic kidney disease with stage 5 chronic kidney disease or end stage renal disease: Secondary | ICD-10-CM | POA: Diagnosis not present

## 2016-12-08 DIAGNOSIS — I639 Cerebral infarction, unspecified: Secondary | ICD-10-CM | POA: Diagnosis not present

## 2016-12-08 DIAGNOSIS — R2689 Other abnormalities of gait and mobility: Secondary | ICD-10-CM | POA: Diagnosis not present

## 2016-12-08 DIAGNOSIS — Z833 Family history of diabetes mellitus: Secondary | ICD-10-CM | POA: Diagnosis not present

## 2016-12-08 DIAGNOSIS — M6281 Muscle weakness (generalized): Secondary | ICD-10-CM | POA: Diagnosis not present

## 2016-12-08 DIAGNOSIS — I132 Hypertensive heart and chronic kidney disease with heart failure and with stage 5 chronic kidney disease, or end stage renal disease: Secondary | ICD-10-CM | POA: Diagnosis present

## 2016-12-08 DIAGNOSIS — Z8249 Family history of ischemic heart disease and other diseases of the circulatory system: Secondary | ICD-10-CM | POA: Diagnosis not present

## 2016-12-08 DIAGNOSIS — Z79899 Other long term (current) drug therapy: Secondary | ICD-10-CM | POA: Diagnosis not present

## 2016-12-08 DIAGNOSIS — I2510 Atherosclerotic heart disease of native coronary artery without angina pectoris: Secondary | ICD-10-CM | POA: Diagnosis present

## 2016-12-08 DIAGNOSIS — I427 Cardiomyopathy due to drug and external agent: Secondary | ICD-10-CM | POA: Diagnosis not present

## 2016-12-08 DIAGNOSIS — I10 Essential (primary) hypertension: Secondary | ICD-10-CM | POA: Diagnosis not present

## 2016-12-08 DIAGNOSIS — G459 Transient cerebral ischemic attack, unspecified: Secondary | ICD-10-CM | POA: Diagnosis not present

## 2016-12-08 DIAGNOSIS — R262 Difficulty in walking, not elsewhere classified: Secondary | ICD-10-CM | POA: Diagnosis not present

## 2016-12-08 DIAGNOSIS — I248 Other forms of acute ischemic heart disease: Secondary | ICD-10-CM | POA: Diagnosis present

## 2016-12-08 DIAGNOSIS — I509 Heart failure, unspecified: Secondary | ICD-10-CM | POA: Diagnosis not present

## 2016-12-08 DIAGNOSIS — K219 Gastro-esophageal reflux disease without esophagitis: Secondary | ICD-10-CM | POA: Diagnosis not present

## 2016-12-08 DIAGNOSIS — D631 Anemia in chronic kidney disease: Secondary | ICD-10-CM | POA: Diagnosis present

## 2016-12-08 DIAGNOSIS — I159 Secondary hypertension, unspecified: Secondary | ICD-10-CM | POA: Diagnosis not present

## 2016-12-08 DIAGNOSIS — B1920 Unspecified viral hepatitis C without hepatic coma: Secondary | ICD-10-CM | POA: Diagnosis present

## 2016-12-08 DIAGNOSIS — E785 Hyperlipidemia, unspecified: Secondary | ICD-10-CM | POA: Diagnosis not present

## 2016-12-08 DIAGNOSIS — F17210 Nicotine dependence, cigarettes, uncomplicated: Secondary | ICD-10-CM | POA: Diagnosis present

## 2016-12-08 DIAGNOSIS — Z7982 Long term (current) use of aspirin: Secondary | ICD-10-CM | POA: Diagnosis not present

## 2016-12-08 DIAGNOSIS — N186 End stage renal disease: Secondary | ICD-10-CM | POA: Diagnosis not present

## 2016-12-08 DIAGNOSIS — K739 Chronic hepatitis, unspecified: Secondary | ICD-10-CM | POA: Diagnosis not present

## 2016-12-08 DIAGNOSIS — Z8673 Personal history of transient ischemic attack (TIA), and cerebral infarction without residual deficits: Secondary | ICD-10-CM | POA: Diagnosis not present

## 2016-12-08 DIAGNOSIS — J9620 Acute and chronic respiratory failure, unspecified whether with hypoxia or hypercapnia: Secondary | ICD-10-CM | POA: Diagnosis not present

## 2016-12-08 DIAGNOSIS — G629 Polyneuropathy, unspecified: Secondary | ICD-10-CM | POA: Diagnosis not present

## 2016-12-08 DIAGNOSIS — R0789 Other chest pain: Secondary | ICD-10-CM | POA: Diagnosis not present

## 2016-12-08 DIAGNOSIS — I714 Abdominal aortic aneurysm, without rupture: Secondary | ICD-10-CM | POA: Diagnosis present

## 2016-12-08 DIAGNOSIS — I4891 Unspecified atrial fibrillation: Secondary | ICD-10-CM | POA: Diagnosis present

## 2016-12-08 DIAGNOSIS — E43 Unspecified severe protein-calorie malnutrition: Secondary | ICD-10-CM | POA: Diagnosis not present

## 2016-12-08 DIAGNOSIS — R748 Abnormal levels of other serum enzymes: Secondary | ICD-10-CM

## 2016-12-08 DIAGNOSIS — I252 Old myocardial infarction: Secondary | ICD-10-CM | POA: Diagnosis not present

## 2016-12-08 DIAGNOSIS — I160 Hypertensive urgency: Secondary | ICD-10-CM | POA: Diagnosis present

## 2016-12-08 DIAGNOSIS — H5461 Unqualified visual loss, right eye, normal vision left eye: Secondary | ICD-10-CM | POA: Diagnosis present

## 2016-12-08 DIAGNOSIS — I5040 Unspecified combined systolic (congestive) and diastolic (congestive) heart failure: Secondary | ICD-10-CM | POA: Diagnosis present

## 2016-12-08 DIAGNOSIS — Z992 Dependence on renal dialysis: Secondary | ICD-10-CM | POA: Diagnosis not present

## 2016-12-08 DIAGNOSIS — Z7901 Long term (current) use of anticoagulants: Secondary | ICD-10-CM | POA: Diagnosis not present

## 2016-12-08 DIAGNOSIS — I6529 Occlusion and stenosis of unspecified carotid artery: Secondary | ICD-10-CM | POA: Diagnosis present

## 2016-12-08 DIAGNOSIS — R531 Weakness: Secondary | ICD-10-CM | POA: Diagnosis not present

## 2016-12-08 DIAGNOSIS — Z9119 Patient's noncompliance with other medical treatment and regimen: Secondary | ICD-10-CM | POA: Diagnosis not present

## 2016-12-08 LAB — TROPONIN I
TROPONIN I: 0.34 ng/mL — AB (ref <0.03)
Troponin I: 0.29 ng/mL (ref <0.03)
Troponin I: 0.38 ng/mL (ref <0.03)

## 2016-12-08 LAB — RENAL FUNCTION PANEL
ALBUMIN: 2.9 g/dL — AB (ref 3.5–5.0)
Anion gap: 10 (ref 5–15)
BUN: 37 mg/dL — ABNORMAL HIGH (ref 6–20)
CHLORIDE: 104 mmol/L (ref 101–111)
CO2: 28 mmol/L (ref 22–32)
CREATININE: 10.46 mg/dL — AB (ref 0.61–1.24)
Calcium: 9.1 mg/dL (ref 8.9–10.3)
GFR calc Af Amer: 5 mL/min — ABNORMAL LOW (ref >60)
GFR, EST NON AFRICAN AMERICAN: 4 mL/min — AB (ref >60)
Glucose, Bld: 103 mg/dL — ABNORMAL HIGH (ref 65–99)
PHOSPHORUS: 4.2 mg/dL (ref 2.5–4.6)
Potassium: 4.5 mmol/L (ref 3.5–5.1)
SODIUM: 142 mmol/L (ref 135–145)

## 2016-12-08 LAB — I-STAT TROPONIN, ED: Troponin i, poc: 0.3 ng/mL (ref 0.00–0.08)

## 2016-12-08 LAB — CBC
HCT: 23.8 % — ABNORMAL LOW (ref 39.0–52.0)
Hemoglobin: 8.1 g/dL — ABNORMAL LOW (ref 13.0–17.0)
MCH: 29.8 pg (ref 26.0–34.0)
MCHC: 34 g/dL (ref 30.0–36.0)
MCV: 87.5 fL (ref 78.0–100.0)
PLATELETS: 175 10*3/uL (ref 150–400)
RBC: 2.72 MIL/uL — ABNORMAL LOW (ref 4.22–5.81)
RDW: 13.3 % (ref 11.5–15.5)
WBC: 5.1 10*3/uL (ref 4.0–10.5)

## 2016-12-08 LAB — MRSA PCR SCREENING: MRSA BY PCR: NEGATIVE

## 2016-12-08 MED ORDER — ELBASVIR-GRAZOPREVIR 50-100 MG PO TABS: 1.0000 | ORAL_TABLET | Freq: Every day | ORAL | Status: DC

## 2016-12-08 MED ORDER — ACETAMINOPHEN 325 MG PO TABS: 650.0000 mg | ORAL_TABLET | ORAL | Status: DC | PRN

## 2016-12-08 MED ORDER — HEPARIN SODIUM (PORCINE) 5000 UNIT/ML IJ SOLN
5000.0000 [IU] | Freq: Three times a day (TID) | INTRAMUSCULAR | Status: DC
  Administered 2016-12-08 – 2016-12-11 (×7): 5000 [IU] via SUBCUTANEOUS
  Filled 2016-12-08 (×7): qty 1

## 2016-12-08 MED ORDER — ATORVASTATIN CALCIUM 40 MG PO TABS
40.0000 mg | ORAL_TABLET | Freq: Every day | ORAL | Status: DC
  Administered 2016-12-08 – 2016-12-10 (×3): 40 mg via ORAL
  Filled 2016-12-08 (×3): qty 1

## 2016-12-08 MED ORDER — ENOXAPARIN SODIUM 30 MG/0.3ML ~~LOC~~ SOLN
30.0000 mg | SUBCUTANEOUS | Status: DC
  Filled 2016-12-08: qty 0.3

## 2016-12-08 MED ORDER — ASPIRIN 81 MG PO CHEW
81.0000 mg | CHEWABLE_TABLET | Freq: Every day | ORAL | Status: DC
  Administered 2016-12-08 – 2016-12-11 (×3): 81 mg via ORAL
  Filled 2016-12-08 (×3): qty 1

## 2016-12-08 MED ORDER — CLOPIDOGREL BISULFATE 75 MG PO TABS
75.0000 mg | ORAL_TABLET | Freq: Every day | ORAL | Status: DC
  Administered 2016-12-08 – 2016-12-11 (×3): 75 mg via ORAL
  Filled 2016-12-08 (×3): qty 1

## 2016-12-08 MED ORDER — ASPIRIN 81 MG PO CHEW: 324.0000 mg | CHEWABLE_TABLET | Freq: Once | ORAL | Status: DC

## 2016-12-08 MED ORDER — ENSURE ENLIVE PO LIQD
237.0000 mL | Freq: Two times a day (BID) | ORAL | Status: DC
  Administered 2016-12-08 – 2016-12-09 (×3): 237 mL via ORAL

## 2016-12-08 MED ORDER — DOXERCALCIFEROL 4 MCG/2ML IV SOLN
2.0000 ug | INTRAVENOUS | Status: DC
  Administered 2016-12-08 – 2016-12-10 (×2): 2 ug via INTRAVENOUS
  Filled 2016-12-08 (×2): qty 2

## 2016-12-08 MED ORDER — MAGNESIUM HYDROXIDE 400 MG/5ML PO SUSP: 15.0000 mL | Freq: Every day | ORAL | Status: DC | PRN

## 2016-12-08 MED ORDER — GI COCKTAIL ~~LOC~~: 30.0000 mL | Freq: Four times a day (QID) | ORAL | Status: DC | PRN

## 2016-12-08 MED ORDER — MORPHINE SULFATE (PF) 4 MG/ML IV SOLN: 2.0000 mg | INTRAVENOUS | Status: DC | PRN

## 2016-12-08 MED ORDER — CALCIUM ACETATE (PHOS BINDER) 667 MG PO CAPS
1334.0000 mg | ORAL_CAPSULE | Freq: Three times a day (TID) | ORAL | Status: DC
  Administered 2016-12-08 – 2016-12-11 (×7): 1334 mg via ORAL
  Filled 2016-12-08 (×8): qty 2

## 2016-12-08 MED ORDER — ONDANSETRON HCL 4 MG/2ML IJ SOLN: 4.0000 mg | Freq: Four times a day (QID) | INTRAMUSCULAR | Status: DC | PRN

## 2016-12-08 MED ORDER — ENOXAPARIN SODIUM 40 MG/0.4ML ~~LOC~~ SOLN
40.0000 mg | SUBCUTANEOUS | Status: DC
  Filled 2016-12-08: qty 0.4

## 2016-12-08 MED ORDER — DOCUSATE SODIUM 100 MG PO CAPS
200.0000 mg | ORAL_CAPSULE | Freq: Every day | ORAL | Status: DC
  Administered 2016-12-08 – 2016-12-11 (×3): 200 mg via ORAL
  Filled 2016-12-08 (×3): qty 2

## 2016-12-08 MED ORDER — GABAPENTIN 100 MG PO CAPS
100.0000 mg | ORAL_CAPSULE | Freq: Three times a day (TID) | ORAL | Status: DC
  Administered 2016-12-08 – 2016-12-11 (×8): 100 mg via ORAL
  Filled 2016-12-08 (×8): qty 1

## 2016-12-08 MED ORDER — NITROGLYCERIN 0.4 MG SL SUBL
0.4000 mg | SUBLINGUAL_TABLET | SUBLINGUAL | Status: DC | PRN
  Administered 2016-12-09: 0.4 mg via SUBLINGUAL
  Filled 2016-12-08: qty 1

## 2016-12-08 MED ORDER — DOXERCALCIFEROL 4 MCG/2ML IV SOLN
INTRAVENOUS | Status: AC
  Administered 2016-12-08: 2 ug via INTRAVENOUS
  Filled 2016-12-08: qty 2

## 2016-12-08 MED ORDER — NIFEDIPINE ER OSMOTIC RELEASE 90 MG PO TB24
90.0000 mg | ORAL_TABLET | Freq: Every day | ORAL | Status: DC
  Administered 2016-12-08 – 2016-12-09 (×2): 90 mg via ORAL
  Filled 2016-12-08 (×2): qty 1

## 2016-12-08 MED ORDER — ASPIRIN 81 MG PO CHEW
324.0000 mg | CHEWABLE_TABLET | Freq: Once | ORAL | Status: AC
  Administered 2016-12-08: 324 mg via ORAL
  Filled 2016-12-08: qty 4

## 2016-12-08 MED ORDER — ISOSORB DINITRATE-HYDRALAZINE 20-37.5 MG PO TABS
1.0000 | ORAL_TABLET | Freq: Three times a day (TID) | ORAL | Status: DC
  Administered 2016-12-08 – 2016-12-11 (×7): 1 via ORAL
  Filled 2016-12-08 (×8): qty 1

## 2016-12-08 MED ORDER — METOPROLOL TARTRATE 25 MG PO TABS
25.0000 mg | ORAL_TABLET | Freq: Two times a day (BID) | ORAL | Status: DC
  Administered 2016-12-08 – 2016-12-11 (×6): 25 mg via ORAL
  Filled 2016-12-08 (×6): qty 1

## 2016-12-08 NOTE — Care Management Note (Addendum)
Case Management Note  Patient Details  Name: Trevor [name] MRN: [id number] Date of Birth: 04/16/1946  Subjective/Objective:     Pt presented for chest pain. Pt is from home alone. Has support of son and sister, however both work. Pt has PCP at the [facility] PCP Dr Lynnette Caffey and Tarry Kos. Pt uses the [place] transportation for appointments. Pt uses the local [facility] on [address] for medications. Pt states he is blind in Right Eye and is active with [facility], PT,OT, SW Services for medication managment.                 Action/Plan: CM did call [facility] to verify that pt is active with services. Per pt he should have transportation home from son when stable for d/c. No further needs from CM at this time.   Expected Discharge Date:                  Expected Discharge Plan:  [place]  In-House Referral:  NA  Discharge planning Services  CM Consult  Post Acute Care Choice:  Home Health, Resumption of Svcs/PTA Provider Choice offered to:  Patient  DME Arranged:  N/A DME Agency:  NA  HH Arranged:  RN, PT,OT,SW [place] Agency:  [facility]  Status of Service:  Completed, signed off  If discussed at [place] of Stay Meetings, dates discussed:    Additional Comments: 1050 12-11-16 Jacqlyn Krauss, RN,BSN [phone] Pt planned for d/c to [name]. Previous CSW was speaking with Maggie with the [facility] in regards to placement. CSW notified this am in regards to d/c. No further needs from CM at this time.    [place] 12-09-16 Jacqlyn Krauss, RN,BSN [phone] [facility] Maggie did reach out to this CM in regards to disposition needs for [name]. CM did provide CSW with [facility] CSW contact information. CM will continue to monitor for additional needs.  Bethena Roys, RN 12/08/2016, 11:01 AM

## 2016-12-08 NOTE — Consult Note (Signed)
Patient ID: Trevor [name] MRN: [id number], DOB/AGE: [birth year]   Admit date: 12/07/2016  Reason for Consult: Chest Pain Requesting MD: Dr. Eliseo Squires, Internal Medicine   Primary Physician: [facility] Primary Cardiologist: Dr. Stanford Breed   Pt. Profile:  Trevor [name] is a 71 year old AA male with CAD, end-stage renal disease on hemodialysis, hypertension, hyperlipidemia and AAA, admitted for chest pain in the setting of hypertensive urgency.   Problem List  Past Medical History:  Diagnosis Date  . Anxiety   . Arthritis   . Blindness   . Cardiomyopathy   . Chest pain    heart cath 2015, no intervention (LCX 60-80%) otherwise nonobst disease  . 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 ESRD on hemodialysis ([place])    started HD 2014-15  . Headache(784.0)   . HTN (hypertension)   . Myocardial infarction   . Shortness of breath     Past Surgical History:  Procedure Laterality Date  . CARDIAC CATHETERIZATION N/A 11/01/2016   Procedure: Left Heart Cath and Coronary Angiography;  Surgeon: Lorretta Harp, MD;  Location: [place] CV LAB;  Service: Cardiovascular;  Laterality: N/A;  . INSERTION OF DIALYSIS CATHETER  09/21/2012   Procedure: INSERTION OF DIALYSIS CATHETER;  Surgeon: Angelia Mould, MD;  Location: [place];  Service: Vascular;  Laterality: N/A;  Right Internal Jugular Placement  . LEFT HEART CATHETERIZATION WITH CORONARY ANGIOGRAM N/A 09/28/2012   Procedure: LEFT HEART CATHETERIZATION WITH CORONARY ANGIOGRAM;  Surgeon: Sherren Mocha, MD;  Location: [facility] CATH LAB;  Service: Cardiovascular;  Laterality: N/A;  . LEFT HEART CATHETERIZATION WITH CORONARY ANGIOGRAM N/A 07/07/2014   Procedure: LEFT HEART CATHETERIZATION WITH CORONARY ANGIOGRAM;  Surgeon: Leonie Man, MD;  Location: [facility] CATH LAB;  Service: Cardiovascular;  Laterality: N/A;  . right hand    . TEE WITHOUT CARDIOVERSION  10/09/2011   Procedure:  TRANSESOPHAGEAL ECHOCARDIOGRAM (TEE);  Surgeon: Lelon Perla, MD;  Location: [facility] ENDOSCOPY;  Service: Cardiovascular;  Laterality: N/A;     Allergies  No Known Allergies  HPI  71 y/o AA male, followed by Dr. Stanford Breed, who has been admitted for chest pain evaluation. He has CKD, on HD MWF, with a history of of labile B/P as well as known CAD. His blood pressure alternates from too high to too low. He frequently doesn't take medications on dialysis days because he feels weak. He was recently admitted with hypertensive urgency 11/01/16. Initially it was thought he was having a STEMI. Cath revealed 60% CFX and 60% Dx1. This was unchanged from prior caths done in 2013 and 2015. He presented back to [facility] several weeks later with hypertensive urgency. His metoprolol was increased and Bidil was added.  He also has mild systolic and diastolic dysfunction. His last 2D echo 12/2015 showed an EF of 45-50% with grade 1 DD. Additional PMH also includes HTN, HLD,  AAA and h/o CVA. Last assessment of his AAA was by duplex ultrasound 03/08/2015. This demonstrated a 2.6 x 2.7 cm infrarenal AAA. He was instructed to get a repeat study in 2017, but this has not been completed. Also, the patient notes a new diagnosis of hepatitis C, that is being followed and managed by the [facility] hospital in [place].   He presented to the ED yesterday, via EMS, given sudden onset of left sided chest pain. Occurred suddenly last night after watching a football game. He had just eaten a steak and  cheese sub. The pain was sharp and stabbing. Non radiating. No associated SOB, diaphoresis, n/v, dizziness, syncope/ near syncope. The pain was intense, prompting him to call 911.   Upon EMS arrival patient's blood pressure was noted to be in the 200s. Patient was given nitroglycerin in route with improvement of chest pain symptoms. He denies any recurrent CP since being given NTG. He denies any recent exertional chest pain or dyspnea. Patient's  initial troponin was negative, but repeat POC was elevated at 0.30. Lab troponin's are abnormal x 2 at 0.29>>0.34. His SCr is 8.96. Hgb 9.0. He reports full medication compliance at home.    Home Medications  Prior to Admission medications   Medication Sig Start Date End Date Taking? Authorizing Provider  aspirin 81 MG chewable tablet Chew 1 tablet (81 mg total) by mouth daily. 11/04/16  Yes Brittainy Erie Noe, PA-C  atorvastatin (LIPITOR) 40 MG tablet Take 40 mg by mouth at bedtime.   Yes Historical Provider, MD  calcium acetate (PHOSLO) 667 MG capsule Take 1,334 mg by mouth 3 (three) times daily before meals.    Yes Historical Provider, MD  clopidogrel (PLAVIX) 75 MG tablet Take 1 tablet (75 mg total) by mouth daily. 04/05/15  Yes Barton Dubois, MD  docusate sodium (COLACE) 100 MG capsule Take 200 mg by mouth daily.    Yes Historical Provider, MD  Elbasvir-Grazoprevir (ZEPATIER) 50-100 MG TABS Take 1 tablet by mouth daily.   Yes Historical Provider, MD  gabapentin (NEURONTIN) 100 MG capsule Take 1 capsule (100 mg total) by mouth 3 (three) times daily. 04/05/15  Yes Barton Dubois, MD  isosorbide-hydrALAZINE (BIDIL) 20-37.5 MG tablet Take 1 tablet by mouth 3 (three) times daily. 11/03/16  Yes Brittainy Erie Noe, PA-C  magnesium hydroxide (MILK OF MAGNESIA) 400 MG/5ML suspension Take 15 mLs by mouth daily as needed for mild constipation.   Yes Historical Provider, MD  metoprolol tartrate (LOPRESSOR) 25 MG tablet Take 1 tablet (25 mg total) by mouth 2 (two) times daily. 11/03/16  Yes Brittainy Erie Noe, PA-C  NIFEdipine (ADALAT CC) 90 MG 24 hr tablet Take 90 mg by mouth daily.   Yes Historical Provider, MD  Omega-3 Fatty Acids (FISH OIL) 1000 MG CAPS Take 1,000 mg by mouth 2 (two) times daily.   Yes Historical Provider, MD  OVER THE COUNTER MEDICATION Take 2 tablets by mouth at bedtime. Super Beta Prostate (vitamin D, Calcium, Zinc, Selenium, Copper, Manganese, Chromium, molybdenum, Phytosterols, Boron,  Silica)   Yes Historical Provider, MD   Hospital Meds  . aspirin  81 mg Oral Daily  . atorvastatin  40 mg Oral QHS  . calcium acetate  1,334 mg Oral TID AC  . clopidogrel  75 mg Oral Daily  . docusate sodium  200 mg Oral Daily  . Elbasvir-Grazoprevir  1 tablet Oral Daily  . gabapentin  100 mg Oral TID  . heparin subcutaneous  5,000 Units Subcutaneous Q8H  . isosorbide-hydrALAZINE  1 tablet Oral TID  . metoprolol tartrate  25 mg Oral BID  . NIFEdipine  90 mg Oral Daily   Family History  Family History  Problem Relation Age of Onset  . Heart attack Mother     MI in her 39s  . Diabetes Mother   . Alcohol abuse Father   . Anesthesia problems Neg Hx   . Hypotension Neg Hx   . Malignant hyperthermia Neg Hx   . Pseudochol deficiency Neg Hx     Social History  Social History  Social History  . Marital status: Divorced    Spouse name: N/A  . Number of children: 2  . Years of education: N/A   Occupational History  . Not on file.   Social History Main Topics  . Smoking status: Current Some Day Smoker    Packs/day: 0.50    Years: 50.00    Types: Cigarettes  . Smokeless tobacco: Never Used  . Alcohol use No     Comment: Occasional  . Drug use: No  . Sexual activity: Yes   Other Topics Concern  . Not on file   Social History Narrative  . No narrative on file     Review of Systems General:  No chills, fever, night sweats or weight changes.  Cardiovascular:  No chest pain, dyspnea on exertion, edema, orthopnea, palpitations, paroxysmal nocturnal dyspnea. Dermatological: No rash, lesions/masses Respiratory: No cough, dyspnea Urologic: No hematuria, dysuria Abdominal:   No nausea, vomiting, diarrhea, bright red blood per rectum, melena, or hematemesis Neurologic:  No visual changes, wkns, changes in mental status. All other systems reviewed and are otherwise negative except as noted above.  Physical Exam  Blood pressure 118/89, pulse (!) 110, temperature 98.6 F  (37 C), temperature source Oral, resp. rate (!) 25, height 5\' 10"  (1.778 m), weight 137 lb 9.6 oz (62.4 kg), SpO2 94 %.  General: Pleasant, NAD, thin appearing.  Psych: Normal affect. Neuro: Alert and oriented X 3. Moves all extremities spontaneously. HEENT: Normal  Neck: Supple without bruits or JVD. Lungs:  Resp regular and unlabored, CTA. Heart: RRR no s3, s4, or murmurs. Abdomen: Soft, non-tender, non-distended, BS + x 4.  Extremities: No clubbing, cyanosis or edema. DP/PT/Radials 2+ and equal bilaterally.  Labs  Troponin [facility] of Care Test)  Recent Labs  12/08/16 0221  TROPIPOC 0.30*    Recent Labs  12/08/16 0335 12/08/16 0630  TROPONINI 0.29* 0.34*   Lab Results  Component Value Date   WBC 6.0 12/07/2016   HGB 9.0 (L) 12/07/2016   HCT 26.7 (L) 12/07/2016   MCV 88.1 12/07/2016   PLT 170 12/07/2016    Recent Labs Lab 12/07/16 2200  NA 142  K 4.0  CL 105  CO2 24  BUN 32*  CREATININE 8.96*  CALCIUM 9.5  GLUCOSE 82   Lab Results  Component Value Date   CHOL 111 11/01/2016   HDL 33 (L) 11/01/2016   LDLCALC 60 11/01/2016   TRIG 91 11/01/2016   No results found for: DDIMER   Radiology/Studies  Dg Chest 2 View  Result Date: 12/08/2016 CLINICAL DATA:  71 y/o  M; left-sided chest pain. EXAM: CHEST  2 VIEW COMPARISON:  11/01/2016 chest radiograph FINDINGS: The heart size and mediastinal contours are within normal limits and stable. Aortic atherosclerosis with calcifications. Both lungs are clear. The visualized skeletal structures are unremarkable. IMPRESSION: No active cardiopulmonary disease. Electronically Signed   By: Kristine Garbe M.D.   On: 12/08/2016 00:29    ECG  NSR. LVH, unchanged from prior EKG 10/2016  ASSESSMENT AND PLAN  Principal Problem:   Chest pain  1. Hypertensive Urgency: Improved. Initial BP was noted to be in the 270J systolic, accompanied by chest pain and slightly elevated troponin with flat trend. BP improved,  symptoms resolved and troponin level flat. Continue BP meds and monitor.   2. Chest Pain/CAD: atypical chest pain, in the setting of severe HTN with BP in the 500X systolic, initially. No recurrent CP since admission. Last LHC was recent, 10/2016, showing  stable disease with 60% mid LCx and 60% Dx1. This was unchanged from prior caths done in 2013 and 2015. His troponins are slightly abnormal at 0.29>>0.34. Suspect this is likely secondary to demand ischemia from hypertensive urgency. Recommend ambulating the unit today. No further ischemic w/u if no exertional symptoms. Continue current regimen and management of BP. Continue ASA, Plavix, Lipitor, metoprolol and Bidil.   3. ESRD: HD MWF  4. HTN: Currently controlled, 118/89. Was elevated at time of initial presentation. Continue metoprolol, Bidil and nifedipine.   5. HLD : Lipids controlled on recent lipid panel 11/01/16. LDL was 60 mg/dL. Goal given CAD is < 70 mg/dL. Continue Lipitor 40.   6. AAA:  Last assessment of his AAA was by duplex ultrasound 03/08/2015. This demonstrated a 2.6 x 2.7 cm infrarenal AAA. He was instructed to get a repeat study in 2017, but this has not bee completed. Can repeat study as an outpatient at our [facility] office.   7. Combined Systolic + Diastolic HF: echo 07/6766 showed an EF of 45-50% with grade 1DD. Volume appears stable. He denies dyspnea. BP currently controlled. Continue BB and Bidil. No ACE/ARB given CKD.   8. Hepatitis C: Pt reports this is a new diagnosis, being followed and management by the [facility] hospital.   Signed, Lyda Jester, PA-C 12/08/2016, 8:48 AM   History and all data above reviewed.  Patient examined.  I agree with the findings as above.   The patient has recurrent pain similar to pain that he had in Dec at the time of his cath.  No change in his EKG.  Enzymes are non elevated but non diagnostic in this setting.  The patient exam reveals COR:RRR  ,  Lungs: Clear  ,  Abd: Positive bowel sounds,  no rebound no guarding, Ext Decreased pulses, no foot wounds.   .  All available labs, radiology testing, previous records reviewed. Agree with documented assessment and plan.   Chest Pain:  Medical management.  No in patient testing is indicated.  He does need SLNTG to take at home.  I did discuss the use of this but this should be reinforced at discharge.  We will see as needed.    Jeneen Rinks Makynzi Eastland  11:10 AM  12/08/2016

## 2016-12-08 NOTE — Consult Note (Signed)
[facility] Renal Consultation Note    Indication for Consultation:  Management of ESRD/hemodialysis; anemia, hypertension/volume and secondary hyperparathyroidism  HPI: Trevor [name] is a 71 y.o. male with ESRD on hemodialysis MWF at [facility]. PMH also significant for HTN, CAD, NICM LV EF 45-50% in 12/2015, AAA, CVA in 2012, Hep C+, right eye blindness. Most recently hospitalized in 10/2016 with NSTEMI in setting of HTN emergency. Had [place] which revealed Prox Cx to Mid Cx lesion, 60 %stenosed 1st Diag lesion, 60 %stenosed.  Trevor [name] presented to [facility] ED via EMS from home yesterday evening.  He reports sudden onset of sharp sternal chest pain similar to the episode of chest pain he had last month. Pain began while he was sitting. He was given nitroglycerin by EMS and chest pain improved. ED course was significant for elevated BP 186/71 EKG with NSR and LVH, elevated troponin 0.3. CXR unremarkable SpO2 96% Pulse 66 K 4.0  Hgb 9.0. He is currently admitted under observation status.   He is seen in bed and denies any chest pain currently. Denies SOB, nausea/vomiting, diaphoresis, abdominal pain.  He was unaware that his BP was elevated on admission. He says the chest pain began before he took his evening meds. After his last admission his metoprolol was increased to 25 mg bid andd BiIDil 20-37.5 mg was added. He does report compliance with medications. He lives at home by himself. He has a home health nurse that visits once a month to organize his prescriptions. He has home health aide that visits twice a week and sometimes brings food.  [facility] cHe is still smoking 4-5 cigarettes/day. Last HD was 12/05/16. He usually completes full treatments. He has been leaving below his EDW and reports feeling weak after HD.  Past Medical History:  Diagnosis Date  . AAA (abdominal aortic aneurysm) ([place])   . Anxiety   .  Arthritis   . Blindness   . CHF (congestive heart failure) ([place])   . 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 ESRD on hemodialysis ([place])    started HD 2014-15  . Headache(784.0)   . HTN (hypertension)   . Protein-calorie malnutrition ([place])   . Stroke [facility])    Past Surgical History:  Procedure Laterality Date  . CARDIAC CATHETERIZATION N/A 11/01/2016   Procedure: Left Heart Cath and Coronary Angiography;  Surgeon: Lorretta Harp, MD;  Location: [place] CV LAB;  Service: Cardiovascular;  Laterality: N/A;  . INSERTION OF DIALYSIS CATHETER  09/21/2012   Procedure: INSERTION OF DIALYSIS CATHETER;  Surgeon: Angelia Mould, MD;  Location: [place];  Service: Vascular;  Laterality: N/A;  Right Internal Jugular Placement  . LEFT HEART CATHETERIZATION WITH CORONARY ANGIOGRAM N/A 09/28/2012   Procedure: LEFT HEART CATHETERIZATION WITH CORONARY ANGIOGRAM;  Surgeon: Sherren Mocha, MD;  Location: [facility] CATH LAB;  Service: Cardiovascular;  Laterality: N/A;  . LEFT HEART CATHETERIZATION WITH CORONARY ANGIOGRAM N/A 07/07/2014   Procedure: LEFT HEART CATHETERIZATION WITH CORONARY ANGIOGRAM;  Surgeon: Leonie Man, MD;  Location: [facility] CATH LAB;  Service: Cardiovascular;  Laterality: N/A;  . right hand    . TEE WITHOUT CARDIOVERSION  10/09/2011   Procedure: TRANSESOPHAGEAL ECHOCARDIOGRAM (TEE);  Surgeon: Lelon Perla, MD;  Location: [facility] ENDOSCOPY;  Service: Cardiovascular;  Laterality: N/A;   Family History  Problem Relation Age of Onset  . Heart  attack Mother     MI in her 46s  . Diabetes Mother   . Alcohol abuse Father   . Anesthesia problems Neg Hx   . Hypotension Neg Hx   . Malignant hyperthermia Neg Hx   . Pseudochol deficiency Neg Hx    Social History:  reports that he has been smoking Cigarettes.  He has a 25.00 pack-year smoking history. He has never used smokeless tobacco. He reports that he does not drink alcohol or use drugs. No Known  Allergies Prior to Admission medications   Medication Sig Start Date End Date Taking? Authorizing Provider  aspirin 81 MG chewable tablet Chew 1 tablet (81 mg total) by mouth daily. 11/04/16  Yes Brittainy Erie Noe, PA-C  atorvastatin (LIPITOR) 40 MG tablet Take 40 mg by mouth at bedtime.   Yes Historical Provider, MD  calcium acetate (PHOSLO) 667 MG capsule Take 1,334 mg by mouth 3 (three) times daily before meals.    Yes Historical Provider, MD  clopidogrel (PLAVIX) 75 MG tablet Take 1 tablet (75 mg total) by mouth daily. 04/05/15  Yes Barton Dubois, MD  docusate sodium (COLACE) 100 MG capsule Take 200 mg by mouth daily.    Yes Historical Provider, MD  Elbasvir-Grazoprevir (ZEPATIER) 50-100 MG TABS Take 1 tablet by mouth daily.   Yes Historical Provider, MD  gabapentin (NEURONTIN) 100 MG capsule Take 1 capsule (100 mg total) by mouth 3 (three) times daily. 04/05/15  Yes Barton Dubois, MD  isosorbide-hydrALAZINE (BIDIL) 20-37.5 MG tablet Take 1 tablet by mouth 3 (three) times daily. 11/03/16  Yes Brittainy Erie Noe, PA-C  magnesium hydroxide (MILK OF MAGNESIA) 400 MG/5ML suspension Take 15 mLs by mouth daily as needed for mild constipation.   Yes Historical Provider, MD  metoprolol tartrate (LOPRESSOR) 25 MG tablet Take 1 tablet (25 mg total) by mouth 2 (two) times daily. 11/03/16  Yes Brittainy Erie Noe, PA-C  NIFEdipine (ADALAT CC) 90 MG 24 hr tablet Take 90 mg by mouth daily.   Yes Historical Provider, MD  Omega-3 Fatty Acids (FISH OIL) 1000 MG CAPS Take 1,000 mg by mouth 2 (two) times daily.   Yes Historical Provider, MD  OVER THE COUNTER MEDICATION Take 2 tablets by mouth at bedtime. Super Beta Prostate (vitamin D, Calcium, Zinc, Selenium, Copper, Manganese, Chromium, molybdenum, Phytosterols, Boron, Silica)   Yes Historical Provider, MD   Current Facility-Administered Medications  Medication Dose Route Frequency Provider Last Rate Last Dose  . acetaminophen (TYLENOL) tablet 650 mg  650 mg Oral  Q4H PRN Norval Morton, MD      . aspirin chewable tablet 81 mg  81 mg Oral Daily Norval Morton, MD   81 mg at 12/08/16 0758  . atorvastatin (LIPITOR) tablet 40 mg  40 mg Oral QHS Rondell A Tamala Julian, MD      . calcium acetate (PHOSLO) capsule 1,334 mg  1,334 mg Oral TID AC Rondell A Tamala Julian, MD      . clopidogrel (PLAVIX) tablet 75 mg  75 mg Oral Daily Norval Morton, MD   75 mg at 12/08/16 0758  . docusate sodium (COLACE) capsule 200 mg  200 mg Oral Daily Norval Morton, MD   200 mg at 12/08/16 0758  . doxercalciferol (HECTOROL) injection 2 mcg  2 mcg Intravenous Q M,W,F-HD Lynnda Child, PA-C      . Elbasvir-Grazoprevir 50-100 MG TABS 1 tablet  1 tablet Oral Daily Norval Morton, MD      . feeding supplement (  ENSURE ENLIVE) (ENSURE ENLIVE) liquid 237 mL  237 mL Oral BID BM Geradine Girt, DO      . gabapentin (NEURONTIN) capsule 100 mg  100 mg Oral TID Norval Morton, MD   100 mg at 12/08/16 0758  . gi cocktail (Maalox,Lidocaine,Donnatal)  30 mL Oral QID PRN Norval Morton, MD      . heparin injection 5,000 Units  5,000 Units Subcutaneous Q8H Laren Everts, RPH   5,000 Units at 12/08/16 0957  . isosorbide-hydrALAZINE (BIDIL) 20-37.5 MG per tablet 1 tablet  1 tablet Oral TID Rondell A Tamala Julian, MD      . magnesium hydroxide (MILK OF MAGNESIA) suspension 15 mL  15 mL Oral Daily PRN Norval Morton, MD      . metoprolol tartrate (LOPRESSOR) tablet 25 mg  25 mg Oral BID Rondell A Tamala Julian, MD      . morphine 4 MG/ML injection 2 mg  2 mg Intravenous Q2H PRN Norval Morton, MD      . NIFEdipine (PROCARDIA XL/ADALAT-CC) 24 hr tablet 90 mg  90 mg Oral Daily Rondell A Tamala Julian, MD      . nitroGLYCERIN (NITROSTAT) SL tablet 0.4 mg  0.4 mg Sublingual Q5 min PRN Norval Morton, MD      . ondansetron (ZOFRAN) injection 4 mg  4 mg Intravenous Q6H PRN Norval Morton, MD       Labs: Basic Metabolic Panel:  Recent Labs Lab 12/07/16 2200  NA 142  K 4.0  CL 105  CO2 24  GLUCOSE 82  BUN 32*   CREATININE 8.96*  CALCIUM 9.5   Liver Function Tests: No results for input(s): AST, ALT, ALKPHOS, BILITOT, PROT, ALBUMIN in the last 168 hours. No results for input(s): LIPASE, AMYLASE in the last 168 hours. No results for input(s): AMMONIA in the last 168 hours. CBC:  Recent Labs Lab 12/07/16 2200  WBC 6.0  NEUTROABS 3.2  HGB 9.0*  HCT 26.7*  MCV 88.1  PLT 170   Cardiac Enzymes:  Recent Labs Lab 12/08/16 0335 12/08/16 0630 12/08/16 1026  TROPONINI 0.29* 0.34* 0.38*   CBG: No results for input(s): GLUCAP in the last 168 hours. Iron Studies: No results for input(s): IRON, TIBC, TRANSFERRIN, FERRITIN in the last 72 hours. Studies/Results: Dg Chest 2 View  Result Date: 12/08/2016 CLINICAL DATA:  71 y/o  M; left-sided chest pain. EXAM: CHEST  2 VIEW COMPARISON:  11/01/2016 chest radiograph FINDINGS: The heart size and mediastinal contours are within normal limits and stable. Aortic atherosclerosis with calcifications. Both lungs are clear. The visualized skeletal structures are unremarkable. IMPRESSION: No active cardiopulmonary disease. Electronically Signed   By: Kristine Garbe M.D.   On: 12/08/2016 00:29    ROS: As per HPI otherwise negative.  Physical Exam: Vitals:   12/08/16 0330 12/08/16 0415 12/08/16 0530 12/08/16 0814  BP: 113/78 119/57 (!) 124/52 118/89  Pulse: 65 65 66 (!) 110  Resp:    (!) 25  Temp:   97.6 F (36.4 C) 98.6 F (37 C)  TempSrc:   Oral Oral  SpO2: 100% 100% 98% 94%  Weight:   62.4 kg (137 lb 9.6 oz)   Height:   5\' 10"  (1.778 m)      General: Very pleasant thin elderly AAM NAD Head: NCAT sclera not icteric MMM  Neck: Supple. No JVD  Lungs: CTA bilaterally without wheezes, rales, or rhonchi. Breathing is unlabored. Heart: RRR with S1 S2.  Abdomen: soft NT + BS  Lower extremities: dry flaky skin without edema or ischemic changes, no open wounds  Neuro: A & O  X 3. Moves all extremities spontaneously. Psych:  Responds to  questions appropriately with a normal affect. Dialysis Access: LUE AVF +thrill/bruit   Dialysis Orders:  [place] MWF 4h 180 F BFR 400/800 2K/2.25Ca UF Profile 4 EDW 60.5 kg Heparin Bolus 1900 U IV q HD Hectorol 2 mcg IV q HD Venofer 50 mg IV q week Mircera 30 mcg IV q 2 weeks  OP Labs: Hgb 9.4 Tsat 50% Ph 6.8 Ca 8.9 PTH 571   Assessment/Plan: 1.  Chest pain -acute onset with h/o of MI - resolved with nitro - per primary/cards consulted  2.  ESRD -  MWF - Plan for HD today on schedule  3.  Hypertension - BP elevated on admission - improved with nitro -  on Coreg bid/Bidil tid/Procardia qd  - notes in OP record that he may be having a hard time remembering to take meds  4. Volume - CXR clear/no edema on exam - has been leaving below OP EDW with minimal UF last post HD wt 60 kg  5.  Anemia  - Hgb stable/ cont ESA/Fe  6.  Metabolic bone disease -  Cont VDRA/PhosLo 7.  Nutrition - Renal diet/vitamins  Lynnda Child PA-C [facility] Pager [phone] 12/08/2016, 11:56 AM   Pt seen, examined and agree w A/P as above.  Kelly Splinter MD [employer] pager [phone]   12/08/2016, 3:34 PM

## 2016-12-08 NOTE — Procedures (Signed)
  I was present at this dialysis session, have reviewed the session itself and made  appropriate changes Kelly Splinter MD [place] pager [phone]   12/08/2016, 3:51 PM

## 2016-12-08 NOTE — Progress Notes (Signed)
Patient admitted after midnight, please see H&P.  -PT eval-- has home health -notified nephro for HD -chest pain free currently, appreciate cardiology consult-- will ambulate and hope for d/c later since no further cardiology intervention planned  Eulogio Bear DO

## 2016-12-08 NOTE — H&P (Addendum)
History and Physical    Trevor [name] JSE:[id number] DOB: 04/20/1946 DOA: 12/07/2016  Referring MD/NP/PA: Dr. Leonides Schanz PCP: [facility]  Patient coming from: home via EMS  Chief Complaint: Chest pain  HPI: Trevor [name] is a 71 y.o. 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 fib, AAA, CVA, who presents with complaints of chest pain at approximately 8 PM while sitting watching the end of the [place] football game. Patient states that the pain was sharp and on the left side of his chest. Patient denies radiation of pain. Nothing alleviated or worsened symptoms. Associated symptoms included nausea. Patient denies any diaphoresis, palpitations, shortness of breath, vomiting, fever, chills, lightheadedness, or loss of consciousness. Symptoms felt similar to previous heart attack and therefore called 911. Patient was just admitted into the hospital last month for chest pain undergoing cardiac cath revealed 60% stenosis to the CFX and to the Dx1.  ED Course: Upon EMS arrival patient's blood pressure was noted to be in the 200s. Patient was given nitroglycerin in route with improvement of chest pain symptoms. Patient's initial troponin was negative, but repeat was elevated at 0.3. TRH called to admit for monitoring overnight   Review of Systems: As per HPI otherwise 10 point review of systems negative.   Past Medical History:  Diagnosis Date  . Anxiety   . Arthritis   . Blindness   . Cardiomyopathy   . Chest pain    heart cath 2015, no intervention (LCX 60-80%) otherwise nonobst disease  . 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 ESRD on hemodialysis ([place])    started HD 2014-15  . Headache(784.0)   . HTN (hypertension)   . Myocardial infarction   . Shortness of breath     Past Surgical History:  Procedure Laterality Date  . CARDIAC CATHETERIZATION N/A 11/01/2016   Procedure: Left Heart Cath  and Coronary Angiography;  Surgeon: Lorretta Harp, MD;  Location: [facility];  Service: Cardiovascular;  Laterality: N/A;  . INSERTION OF DIALYSIS CATHETER  09/21/2012   Procedure: INSERTION OF DIALYSIS CATHETER;  Surgeon: Angelia Mould, MD;  Location: [facility];  Service: Vascular;  Laterality: N/A;  Right Internal Jugular Placement  . LEFT HEART CATHETERIZATION WITH CORONARY ANGIOGRAM N/A 09/28/2012   Procedure: LEFT HEART CATHETERIZATION WITH CORONARY ANGIOGRAM;  Surgeon: Sherren Mocha, MD;  Location: [facility] CATH LAB;  Service: Cardiovascular;  Laterality: N/A;  . LEFT HEART CATHETERIZATION WITH CORONARY ANGIOGRAM N/A 07/07/2014   Procedure: LEFT HEART CATHETERIZATION WITH CORONARY ANGIOGRAM;  Surgeon: Leonie Man, MD;  Location: [facility] CATH LAB;  Service: Cardiovascular;  Laterality: N/A;  . right hand    . TEE WITHOUT CARDIOVERSION  10/09/2011   Procedure: TRANSESOPHAGEAL ECHOCARDIOGRAM (TEE);  Surgeon: Lelon Perla, MD;  Location: [facility] ENDOSCOPY;  Service: Cardiovascular;  Laterality: N/A;     reports that he has been smoking Cigarettes.  He has a 25.00 pack-year smoking history. He has never used smokeless tobacco. He reports that he does not drink alcohol or use drugs.  No Known Allergies  Family History  Problem Relation Age of Onset  . Heart attack Mother     MI in her 33s  . Diabetes Mother   . Alcohol abuse Father   . Anesthesia problems Neg Hx   . Hypotension Neg Hx   . Malignant hyperthermia Neg Hx   . Pseudochol deficiency  Neg Hx     Prior to Admission medications   Medication Sig Start Date End Date Taking? Authorizing Provider  aspirin 81 MG chewable tablet Chew 1 tablet (81 mg total) by mouth daily. 11/04/16  Yes Brittainy Erie Noe, PA-C  atorvastatin (LIPITOR) 40 MG tablet Take 40 mg by mouth at bedtime.   Yes Historical Provider, MD  calcium acetate (PHOSLO) 667 MG capsule Take 1,334 mg by mouth 3 (three) times daily before meals.    Yes Historical  Provider, MD  clopidogrel (PLAVIX) 75 MG tablet Take 1 tablet (75 mg total) by mouth daily. 04/05/15  Yes Barton Dubois, MD  docusate sodium (COLACE) 100 MG capsule Take 200 mg by mouth daily.    Yes Historical Provider, MD  Elbasvir-Grazoprevir (ZEPATIER) 50-100 MG TABS Take 1 tablet by mouth daily.   Yes Historical Provider, MD  gabapentin (NEURONTIN) 100 MG capsule Take 1 capsule (100 mg total) by mouth 3 (three) times daily. 04/05/15  Yes Barton Dubois, MD  isosorbide-hydrALAZINE (BIDIL) 20-37.5 MG tablet Take 1 tablet by mouth 3 (three) times daily. 11/03/16  Yes Brittainy Erie Noe, PA-C  magnesium hydroxide (MILK OF MAGNESIA) 400 MG/5ML suspension Take 15 mLs by mouth daily as needed for mild constipation.   Yes Historical Provider, MD  metoprolol tartrate (LOPRESSOR) 25 MG tablet Take 1 tablet (25 mg total) by mouth 2 (two) times daily. 11/03/16  Yes Brittainy Erie Noe, PA-C  NIFEdipine (ADALAT CC) 90 MG 24 hr tablet Take 90 mg by mouth daily.   Yes Historical Provider, MD  Omega-3 Fatty Acids (FISH OIL) 1000 MG CAPS Take 1,000 mg by mouth 2 (two) times daily.   Yes Historical Provider, MD  OVER THE COUNTER MEDICATION Take 2 tablets by mouth at bedtime. Super Beta Prostate (vitamin D, Calcium, Zinc, Selenium, Copper, Manganese, Chromium, molybdenum, Phytosterols, Boron, Silica)   Yes Historical Provider, MD    Physical Exam:    Constitutional: Elderly male in NAD, calm, comfortable Vitals:   12/08/16 0230 12/08/16 0245 12/08/16 0315 12/08/16 0330  BP: (!) 128/53 126/58 120/62 113/78  Pulse: 64 67 66 65  Resp:      Temp:      TempSrc:      SpO2: 100% 100% 100% 100%   Eyes: PERRL, lids and conjunctivae normal ENMT: Mucous membranes are moist. Posterior pharynx clear of any exudate or lesions Neck: normal, supple, no masses, no thyromegaly Respiratory: clear to auscultation bilaterally, no wheezing, no crackles. Normal respiratory effort. No accessory muscle use.  Cardiovascular:  Regular rate and rhythm, + murmurs / rubs / gallops. No extremity edema. 2+ pedal pulses. No carotid bruits. Left upper arm fistula. Abdomen: no tenderness, no masses palpated. No hepatosplenomegaly. Bowel sounds positive.  Musculoskeletal: no clubbing / cyanosis. No joint deformity upper and lower extremities. Good ROM, no contractures. Normal muscle tone.  Skin: no rashes, lesions, ulcers. No induration Neurologic: CN 2-12 grossly intact. Sensation intact, DTR normal. Strength 5/5 in all 4.  Psychiatric: Normal judgment and insight. Alert and oriented x 3. Normal mood.     Labs on Admission: I have personally reviewed following labs and imaging studies  CBC:  Recent Labs Lab 12/07/16 2200  WBC 6.0  NEUTROABS 3.2  HGB 9.0*  HCT 26.7*  MCV 88.1  PLT 423   Basic Metabolic Panel:  Recent Labs Lab 12/07/16 2200  NA 142  K 4.0  CL 105  CO2 24  GLUCOSE 82  BUN 32*  CREATININE 8.96*  CALCIUM 9.5  GFR: Estimated Creatinine Clearance: 6.3 mL/min (by C-G formula based on SCr of 8.96 mg/dL (H)). Liver Function Tests: No results for input(s): AST, ALT, ALKPHOS, BILITOT, PROT, ALBUMIN in the last 168 hours. No results for input(s): LIPASE, AMYLASE in the last 168 hours. No results for input(s): AMMONIA in the last 168 hours. Coagulation Profile: No results for input(s): INR, PROTIME in the last 168 hours. Cardiac Enzymes: No results for input(s): CKTOTAL, CKMB, CKMBINDEX, TROPONINI in the last 168 hours. BNP (last 3 results) No results for input(s): PROBNP in the last 8760 hours. HbA1C: No results for input(s): HGBA1C in the last 72 hours. CBG: No results for input(s): GLUCAP in the last 168 hours. Lipid Profile: No results for input(s): CHOL, HDL, LDLCALC, TRIG, CHOLHDL, LDLDIRECT in the last 72 hours. Thyroid Function Tests: No results for input(s): TSH, T4TOTAL, FREET4, T3FREE, THYROIDAB in the last 72 hours. Anemia Panel: No results for input(s): VITAMINB12, FOLATE,  FERRITIN, TIBC, IRON, RETICCTPCT in the last 72 hours. Urine analysis:    Component Value Date/Time   COLORURINE YELLOW 12/26/2015 [place] 12/26/2015 1738   LABSPEC 1.012 12/26/2015 1738   PHURINE 8.5 (H) 12/26/2015 1738   GLUCOSEU NEGATIVE 12/26/2015 1738   HGBUR NEGATIVE 12/26/2015 1738   BILIRUBINUR NEGATIVE 12/26/2015 1738   KETONESUR NEGATIVE 12/26/2015 1738   PROTEINUR 100 (A) 12/26/2015 1738   UROBILINOGEN 0.2 01/08/2015 2116   NITRITE NEGATIVE 12/26/2015 1738   LEUKOCYTESUR NEGATIVE 12/26/2015 1738   Sepsis Labs: No results found for this or any previous visit (from the past 240 hour(s)).   Radiological Exams on Admission: Dg Chest 2 View  Result Date: 12/08/2016 CLINICAL DATA:  71 y/o  M; left-sided chest pain. EXAM: CHEST  2 VIEW COMPARISON:  11/01/2016 chest radiograph FINDINGS: The heart size and mediastinal contours are within normal limits and stable. Aortic atherosclerosis with calcifications. Both lungs are clear. The visualized skeletal structures are unremarkable. IMPRESSION: No active cardiopulmonary disease. Electronically Signed   By: Kristine Garbe M.D.   On: 12/08/2016 00:29    EKG: Independently reviewed. Sinus rhythm with LVH with ST wave changes similar to one month ago. Assessment/Plan Chest pain with Elevated troponin: Acute. Patient reports chest pain symptoms which resolved with nitroglycerin. Repeat troponin noted to be elevated at 0.3 - Admit to telemetry bed - Trend cardiac troponins - Check blood drug screen - Cardiology should see in a.m.  ESRD on HD( M/W/F) - Called HD voicemail and left message regarding patient being admitted     DVT prophylaxis: heparin Code Status: Full Family Communication: No family present at bedside  Disposition Plan: Possible discharge home Consults called: Cardiology was consulted Admission status: Observation  Norval Morton MD [facility] Pager [phone]  If  7PM-7AM, please contact night-coverage [email or web address] Password [email or web address]  12/08/2016, 4:09 AM

## 2016-12-08 NOTE — Evaluation (Signed)
Physical Therapy Evaluation Patient Details Name: Trevor [name] MRN: [id number] DOB: August 18, 1946 Today's Date: 12/08/2016   History of Present Illness  pt presents with HTN and Chest Pain relieved by Nitro.  pt with recent admit in 10/2016 for NSTEMI with HTN Emergency.  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    Clinical Impression  Pt generally weak and debilitated, though according to pt this is baseline for him.  Pt admits to falls at home and that he is becoming worried about being home alone.  Pt has an aide x2 per week, but pt indicates he recently discussed with his SW through [facility] that he was interested in long term placement.  Feel this is the appropriate choice for pt to seek out [name] level of care.  Feel pt would benefit from some rehab at a [name] level to maximize independence and overall decrease burden of care.  Will continue to follow while on acute.      Follow Up Recommendations [name]    Equipment Recommendations  None recommended by PT    Recommendations for Other Services       Precautions / Restrictions Precautions Precautions: Fall Precaution Comments: pt visually impaired worse in R eye than L.  pt is able to make out the outlines of ojects, but not the details.   Restrictions Weight Bearing Restrictions: No      Mobility  Bed Mobility Overal bed mobility: Needs Assistance Bed Mobility: Supine to Sit;Sit to Supine     Supine to sit: Supervision Sit to supine: Supervision   General bed mobility comments: pt needs increased time, but no physical A needed.    Transfers Overall transfer level: Needs assistance Equipment used: Rolling walker (2 wheeled) Transfers: Sit to/from Stand Sit to Stand: Min guard         General transfer comment: pt with definite use of UEs, but no phsyical A needed, only guarding for safety.    Ambulation/Gait Ambulation/Gait assistance: Min guard Ambulation Distance (Feet): 120  Feet Assistive device: Rolling walker (2 wheeled) Gait Pattern/deviations: Step-through pattern;Decreased stride length;Trunk flexed     General Gait Details: pt moves slowly and tends to lean on RW.  pt with increased WOB noted during ambulation and pt indicates this is normal for him.  pt indicates fatigue after gait.    Stairs            Wheelchair Mobility    Modified Rankin (Stroke Patients Only)       Balance Overall balance assessment: Needs assistance;History of Falls Sitting-balance support: No upper extremity supported;Feet supported Sitting balance-Leahy Scale: Good     Standing balance support: Single extremity supported;Bilateral upper extremity supported;During functional activity Standing balance-Leahy Scale: Poor                               Pertinent Vitals/Pain Pain Assessment: No/denies pain    Home Living Family/patient expects to be discharged to:: Skilled nursing facility Living Arrangements: Alone               Additional Comments: [facility] aide 2x/wk, 3 hours, RN 1x per month for filling med box, mobile meals    Prior Function Level of Independence: Needs assistance   Gait / Transfers Assistance Needed: uses rollator around house, w/c in community  ADL's / Homemaking Assistance Needed: pt performs his own ADLs, but does admit having more difficulty  recently.  Aide takes pt to grocery store and helps with chores.          Hand Dominance        Extremity/Trunk Assessment   Upper Extremity Assessment Upper Extremity Assessment: Generalized weakness    Lower Extremity Assessment Lower Extremity Assessment: Generalized weakness    Cervical / Trunk Assessment Cervical / Trunk Assessment: Kyphotic  Communication   Communication: No difficulties  Cognition Arousal/Alertness: Awake/alert Behavior During Therapy: WFL for tasks assessed/performed Overall Cognitive Status: History of cognitive impairments - at baseline                  General Comments: pt admits to memory deficits at baseline.      General Comments      Exercises     Assessment/Plan    PT Assessment Patient needs continued PT services  PT Problem List Decreased strength;Decreased activity tolerance;Decreased balance;Decreased mobility;Decreased cognition;Decreased knowledge of use of DME          PT Treatment Interventions DME instruction;Gait training;Functional mobility training;Therapeutic activities;Therapeutic exercise;Balance training;Cognitive remediation;Patient/family education    PT Goals (Current goals can be found in the Care Plan section)  Acute Rehab PT Goals Patient Stated Goal: Get better PT Goal Formulation: With patient Time For Goal Achievement: 12/22/16 Potential to Achieve Goals: Fair    Frequency Min 2X/week   Barriers to discharge        Co-evaluation               End of Session Equipment Utilized During Treatment: Gait belt Activity Tolerance: Patient tolerated treatment well Patient left: in bed;with call bell/phone within reach;with bed alarm set Nurse Communication: Mobility status    Functional Assessment Tool Used: Clinical Judgement Functional Limitation: Mobility: Walking and moving around Mobility: Walking and Moving Around Current Status [phone]): At least 1 percent but less than 20 percent impaired, limited or restricted Mobility: Walking and Moving Around Goal Status [phone]): 0 percent impaired, limited or restricted    Time: 1341-1407 PT Time Calculation (min) (ACUTE ONLY): 26 min   Charges:   PT Evaluation $PT Eval Moderate Complexity: 1 Procedure PT Treatments $Gait Training: 8-22 mins   PT G Codes:   PT G-Codes **NOT FOR INPATIENT CLASS** Functional Assessment Tool Used: Clinical Judgement Functional Limitation: Mobility: Walking and moving around Mobility: Walking and Moving Around Current Status ([id number]): At least 1 percent but less than 20 percent  impaired, limited or restricted Mobility: Walking and Moving Around Goal Status [phone]): 0 percent impaired, limited or restricted    Catarina Hartshorn, PT  [phone] 12/08/2016, 2:37 PM

## 2016-12-09 ENCOUNTER — Telehealth: Payer: Self-pay | Admitting: Cardiovascular Disease

## 2016-12-09 DIAGNOSIS — N186 End stage renal disease: Secondary | ICD-10-CM

## 2016-12-09 DIAGNOSIS — I160 Hypertensive urgency: Principal | ICD-10-CM

## 2016-12-09 DIAGNOSIS — Z992 Dependence on renal dialysis: Secondary | ICD-10-CM

## 2016-12-09 DIAGNOSIS — I10 Essential (primary) hypertension: Secondary | ICD-10-CM

## 2016-12-09 DIAGNOSIS — E785 Hyperlipidemia, unspecified: Secondary | ICD-10-CM

## 2016-12-09 MED ORDER — MORPHINE SULFATE (PF) 2 MG/ML IV SOLN
1.0000 mg | INTRAVENOUS | Status: DC | PRN
  Administered 2016-12-09: 1 mg via INTRAVENOUS
  Filled 2016-12-09: qty 1

## 2016-12-09 MED ORDER — NIFEDIPINE ER 60 MG PO TB24
60.0000 mg | ORAL_TABLET | Freq: Every day | ORAL | Status: DC
  Administered 2016-12-11: 60 mg via ORAL
  Filled 2016-12-09 (×2): qty 1

## 2016-12-09 MED ORDER — NEPRO/CARBSTEADY PO LIQD
237.0000 mL | Freq: Two times a day (BID) | ORAL | Status: DC
  Administered 2016-12-11: 237 mL via ORAL
  Filled 2016-12-09 (×6): qty 237

## 2016-12-09 NOTE — Progress Notes (Signed)
Initial Nutrition Assessment  DOCUMENTATION CODES:   Severe malnutrition in context of chronic illness  INTERVENTION:    D/C Ensure  Offer Nepro Shake po BID, each supplement provides 425 kcal and 19 grams protein  NUTRITION DIAGNOSIS:   Malnutrition related to chronic illness as evidenced by severe depletion of muscle mass, severe depletion of body fat.  GOAL:   Patient will meet greater than or equal to 90% of their needs  MONITOR:   PO intake, Supplement acceptance, I & O's  REASON FOR ASSESSMENT:   Malnutrition Screening Tool    ASSESSMENT:   71 y.o.malewith PMH of ESRD (on HD M/W/F),HTN, HLD, CAD, MI, CHF, A. fib, AAA, CVA, who presents with complaints of chest pain.   Patient reports that he has been eating poorly at home. He has never tried Nepro or Ensure supplements until today. The chocolate flavored Ensure caused diarrhea. He was going to try a Soil scientist Ensure later today. Lunch tray at bedside, only bites taken. Sister in room with patient during RD visit. Patient reports that he does not have transportation and must rely on his sister, son, and daughter to bring him things from the store that he needs. He typically shops at [employer]  that do not carry PO supplements, only "junk food." Discussed with patient to contact his pharmacy and/or the [facility] to see if they could order Nepro supplements for him. Noted patient may be d/c to [name], supplements could be provided at [facility]. Labs reviewed: sodium, potassium, phosphorus WNL Medications reviewed and include Colace and Phoslo. Nutrition-Focused physical exam completed. Findings are moderate and severe fat depletion, moderate and severe muscle depletion, and no edema.    Diet Order:  Diet Heart Room service appropriate? Yes; Fluid consistency: Thin; Fluid restriction: 1200 mL Fluid  Skin:  Reviewed, no issues  Last BM:  1/9  Height:   Ht Readings from Last 1 Encounters:  12/08/16 5'  10" (1.778 m)    Weight:   Wt Readings from Last 1 Encounters:  12/09/16 129 lb 12.8 oz (58.9 kg)    Ideal Body Weight:  75.5 kg  BMI:  Body mass index is 18.62 kg/m.  Estimated Nutritional Needs:   Kcal:  1800-2100  Protein:  80-95 gm  Fluid:  1.2 L  EDUCATION NEEDS:   No education needs identified at this time  Molli Barrows, [place], [place], [place] Pager [phone] After Hours Pager [phone]

## 2016-12-09 NOTE — Progress Notes (Signed)
Pt stated that he was having a hard time breathing. Tech checked 02 sats and recorded sats ranging from100-97% RN notifed. Also OT asked Pt if he wanted to get bath done. Pt stated he was not feeling well at this time.

## 2016-12-09 NOTE — Evaluation (Signed)
Occupational Therapy Evaluation Patient Details Name: Trevor [name] MRN: [id number] DOB: September 25, 1946 Today's Date: 12/09/2016    History of Present Illness pt presents with HTN and Chest Pain relieved by Nitro.  pt with recent admit in 10/2016 for NSTEMI with HTN Emergency.  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     Clinical Impression   Limited evaluation due to pt refusing OOB activity due to breakfast tray arriving and then when returning to attempt further intervention, pt refusing due to not feeling well and difficulty breathing despite O2 sats WNL.  Pt reports that his independence has been declining, with falls at home.  Pt will benefit from further OT services acutely to address ADL and functional mobility.  Recommending [name] at this time due to safety concerns at home as well as to maximize independence and decrease burden of care with ADLs and functional mobility.   Follow Up Recommendations  [name]    Equipment Recommendations   (TBD)    Recommendations for Other Services       Precautions / Restrictions Precautions Precautions: Fall Precaution Comments: pt visually impaired worse in R eye than L.  pt is able to make out the outlines of ojects, but not the details.   Restrictions Weight Bearing Restrictions: No      Mobility Bed Mobility Overal bed mobility: Needs Assistance Bed Mobility: Supine to Sit;Sit to Supine     Supine to sit: Supervision Sit to supine: Supervision   General bed mobility comments: pt needs increased time, but no physical A needed.    Transfers  Pt refused OOB activity.                         ADL Overall ADL's : Needs assistance/impaired                                       General ADL Comments: Min guard with mobility, pt with limited participation due to arrival of breakfast tray.  Completed bed mobility with increased time but no physical assistance.  Demonstrated good use  of BUE for self-feeding     Vision Vision Assessment?: Vision impaired- to be further tested in functional context Additional Comments: Pt reports "blind" in Rt eye, only able to make out shapes in Rt eye          Pertinent Vitals/Pain Pain Assessment: No/denies pain     Hand Dominance Right   Extremity/Trunk Assessment Upper Extremity Assessment Upper Extremity Assessment: Generalized weakness   Lower Extremity Assessment Lower Extremity Assessment: Generalized weakness   Cervical / Trunk Assessment Cervical / Trunk Assessment: Kyphotic   Communication Communication Communication: No difficulties   Cognition Arousal/Alertness: Awake/alert Behavior During Therapy: WFL for tasks assessed/performed Overall Cognitive Status: History of cognitive impairments - at baseline                 General Comments: pt admits to memory deficits at baseline.                Home Living Family/patient expects to be discharged to:: Skilled nursing facility Living Arrangements: Alone                               Additional Comments: [place] aide 2x/wk, 3 hours, RN 1x per  month for filling med box, mobile meals      Prior Functioning/Environment Level of Independence: Needs assistance  Gait / Transfers Assistance Needed: uses rollator around house, w/c in community ADL's / Homemaking Assistance Needed: pt performs his own ADLs, but does admit having more difficulty recently.  Aide takes pt to grocery store and helps with chores.              OT Problem List: Decreased activity tolerance;Impaired balance (sitting and/or standing);Decreased safety awareness;Cardiopulmonary status limiting activity;Pain   OT Treatment/Interventions: Self-care/ADL training;Energy conservation;DME and/or AE instruction;Therapeutic activities;Patient/family education    OT Goals(Current goals can be found in the care plan section) Acute Rehab OT Goals Patient Stated Goal: Get  better OT Goal Formulation: With patient Time For Goal Achievement: 12/23/16 Potential to Achieve Goals: Good  OT Frequency: Min 2X/week   Barriers to D/C: Decreased caregiver support             End of Session Nurse Communication:  (refusing OOB activity at this time)  Activity Tolerance: Other (comment) (declined OOB, self-limiting) Patient left: in bed;with call bell/phone within reach;with nursing/sitter in room   Time: 8648-4720 OT Time Calculation (min): 10 min Charges:  OT General Charges $OT Visit: 1 Procedure OT Evaluation $OT Eval Moderate Complexity: 1 Procedure G-CodesSimonne Come, [id number] 12/09/2016, 12:11 PM

## 2016-12-09 NOTE — Clinical Social Work Placement (Signed)
   CLINICAL SOCIAL WORK PLACEMENT  NOTE  Date:  12/09/2016  Patient Details  Name: Trevor [name] MRN: [id number] Date of Birth: March 22, 1946  Clinical Social Work is seeking post-discharge placement for this patient at the [place] level of care (*CSW will initial, date and re-position this form in  chart as items are completed):  Yes   Patient/family provided with [place] Work Department's list of facilities offering this level of care within the geographic area requested by the patient (or if unable, by the patient's family).  Yes   Patient/family informed of their freedom to choose among providers that offer the needed level of care, that participate in Medicare, Medicaid or managed care program needed by the patient, have an available bed and are willing to accept the patient.  Yes   Patient/family informed of [place]'s ownership interest in [facility] and [facility], as well as of the fact that they are under no obligation to receive care at these facilities.  PASRR submitted to EDS on       PASRR number received on       Existing PASRR number confirmed on 12/09/16     FL2 transmitted to all facilities in geographic area requested by pt/family on 12/09/16     FL2 transmitted to all facilities within larger geographic area on       Patient informed that his/her managed care company has contracts with or will negotiate with certain facilities, including the following:            Patient/family informed of bed offers received.  Patient chooses bed at       Physician recommends and patient chooses bed at      Patient to be transferred to   on  .  Patient to be transferred to facility by       Patient family notified on   of transfer.  Name of family member notified:        PHYSICIAN Please prepare priority discharge summary, including medications, Please prepare prescriptions, Please sign FL2     Additional Comment:     _______________________________________________ Rigoberto Noel, LCSW 12/09/2016, 2:07 PM

## 2016-12-09 NOTE — Clinical Social Work Note (Signed)
Clinical Social Work Assessment  Patient Details  Name: Trevor [name] MRN: [id number] Date of Birth: 1946/05/23  Date of referral:  12/09/16               Reason for consult:  Facility Placement, Discharge Planning                Permission sought to share information with:  Facility Sport and exercise psychologist, Family Supports Permission granted to share information::  Yes, Verbal Permission Granted  Name::     [place]::  SNFs  Relationship::     Contact Information:     Housing/Transportation Living arrangements for the past 2 months:  [place] of Information:  Patient Patient Interpreter Needed:  None Criminal Activity/Legal Involvement Pertinent to Current Situation/Hospitalization:  No - Comment as needed Significant Relationships:  Adult Children Lives with:  Self Do you feel safe going back to the place where you live?  No Need for family participation in patient care:  Yes (Comment)  Care giving concerns:  Patient and family report that the patient is unable to return home at this time due to lack of assistance at home. 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   Social Worker assessment / plan:  CSW met with patient at bedside to complete assessment. The and family state that they agree with recommendation for [name] placement. Children report that the patient has been to [name] in the past but feel that the patient needs more of a long term plan for placement, either long term [name] or transition to ALF after [name]. The patient states that he prefers [facility] [name] if they will offer a bed. CSW explained [name] search/placement process and answered the patient and family's questions. CSW notes that the patient is a veteran and also has a veteran benefit. CSW has reached out to the [place] but has not heard from them regarding any benefit for [name]. Patient and family are aware that the patient will require a 3 night inpatient stay to utilize Medicare for [name]. CSW  will followup with bed offers when available.   Employment status:  Retired Forensic scientist:  [employer], [employer], Other (Comment Required) ([place] eligible just needs to recrtify) PT Recommendations:  [facility] [place] / Referral to community resources:  [place]  Patient/Family's Response to care:  The patient and family report that they are happy with the care the patient has received at [facility]. They are very appreciative of CSW's assistance with placement.  Patient/Family's Understanding of and Emotional Response to Diagnosis, Current Treatment, and Prognosis:  The patient and family appear to have a good understanding of the patient's reason for admission. The patient does say he is still in some pain. The patient understands and agrees with recommendation for [name] placement at time of discharge.   Emotional Assessment Appearance:  Appears stated age Attitude/Demeanor/Rapport:  Other (Patient is appropriate and welcoming of CSW.) Affect (typically observed):  Accepting, Appropriate, Calm, Pleasant Orientation:  Oriented to Self, Oriented to Place, Oriented to  Time, Oriented to Situation Alcohol / Substance use:  Not Applicable Psych involvement (Current and /or in the community):  No (Comment)  Discharge Needs  Concerns to be addressed:  Discharge Planning Concerns Readmission within the last 30 days:  No Current discharge risk:  Chronically ill Barriers to Discharge:  Continued Medical Work up   Fredderick Phenix B, LCSW 12/09/2016, 2:50 PM

## 2016-12-09 NOTE — Clinical Social Work Note (Signed)
Call received from financial counseling regarding patient's insurance. Patient does have Medicare. Patient had Medicaid through the end of 2017, but it appears he needs to recertify for his Medicaid to continue. CSW has updated family.    Liz Beach MSW, [place], [place], [phone]

## 2016-12-09 NOTE — NC FL2 (Signed)
[place] MEDICAID FL2 LEVEL OF CARE SCREENING TOOL     IDENTIFICATION  Patient Name: Trevor [name] Birthdate: 01-29-1946 Sex: male Admission Date (Current Location): 12/07/2016  [facility] [place] Number:  Herbalist and Address:  [facility] [place], [place] [address], [place], [place] [address]      Provider Number: [id number]  Attending Physician Name and Address:  Geradine Girt, DO  Relative Name and Phone Number:       Current Level of Care: Hospital Recommended Level of Care: [place] Prior Approval Number:    Date Approved/Denied: 12/09/16 PASRR Number: [id number]  Discharge Plan: [name]    Current Diagnoses: Patient Active Problem List   Diagnosis Date Noted  . 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 Ischemic chest pain ([place]) 11/01/2016  . Hypoxemia 09/07/2016  . Essential hypertension 03/08/2016  . Orthostatic hypotension 01/12/2016  . Dyslipidemia 01/12/2016  . Cough   . Weakness generalized 12/26/2015  . Chest pain 08/12/2015  . Malignant hypertension 08/12/2015  . Carotid stenosis   . Stroke ([place]) 04/03/2015  . Abdominal aortic aneurysm ([place]) 03/06/2015  . Generalized weakness 09/08/2014  . Acute encephalopathy 09/07/2014  . Hypertensive emergency 07/05/2014  . Protein-calorie malnutrition, severe ([place]) 05/20/2014  . Hypokalemia 05/19/2014  . Syncope and collapse 05/19/2014  . ESRD on hemodialysis ([place]) 05/19/2014  . Anemia of renal disease 05/19/2014  . Coronary artery disease, non-occlusive:  09/28/2012  . Anemia 09/27/2012  . Secondary hyperparathyroidism ([place]) 09/27/2012  . Non compliance with medical treatment 09/27/2012  . Chronic combined systolic and diastolic CHF (congestive heart failure) ([place]) 10/10/2011  . A-fib ([place]) 10/08/2011  . History of stroke 10/07/2011  . Cocaine abuse 10/07/2011  . Severe sinus bradycardia 10/07/2011  . Nonischemic cardiomyopathy ([place]) 09/18/2011  .  Tobacco use disorder 08/07/2011  . Bruit 08/07/2011    Orientation RESPIRATION BLADDER Height & Weight     Self, Time, Situation, Place  Normal Continent Weight: 58.9 kg (129 lb 12.8 oz) Height:  5\' 10"  (177.8 cm)  BEHAVIORAL SYMPTOMS/MOOD NEUROLOGICAL BOWEL NUTRITION STATUS   (NONE)  (NONE) Continent Diet (Heart Healthy)  AMBULATORY STATUS COMMUNICATION OF NEEDS Skin   Limited Assist Verbally Normal                       Personal Care Assistance Level of Assistance  Bathing, Feeding, Dressing Bathing Assistance: Limited assistance Feeding assistance: Independent Dressing Assistance: Limited assistance     Functional Limitations Info  Sight, Hearing, Speech Sight Info: Impaired Hearing Info: Adequate Speech Info: Adequate    SPECIAL CARE FACTORS FREQUENCY  PT (By licensed PT), OT (By licensed OT)     PT Frequency: 5/week OT Frequency: 5/week            Contractures      Additional Factors Info  Code Status, Allergies Code Status Info: Full Code Allergies Info: NKDA           Current Medications (12/09/2016):  This is the current hospital active medication list Current Facility-Administered Medications  Medication Dose Route Frequency Provider Last Rate Last Dose  . acetaminophen (TYLENOL) tablet 650 mg  650 mg Oral Q4H PRN Norval Morton, MD      . aspirin chewable tablet 81 mg  81 mg Oral Daily Norval Morton, MD   81 mg at 12/09/16 2119  . atorvastatin (LIPITOR) tablet 40 mg  40 mg Oral QHS Norval Morton, MD   40  mg at 12/08/16 2140  . calcium acetate (PHOSLO) capsule 1,334 mg  1,334 mg Oral TID AC Norval Morton, MD   1,334 mg at 12/09/16 1111  . clopidogrel (PLAVIX) tablet 75 mg  75 mg Oral Daily Norval Morton, MD   75 mg at 12/09/16 0832  . docusate sodium (COLACE) capsule 200 mg  200 mg Oral Daily Norval Morton, MD   200 mg at 12/09/16 0832  . doxercalciferol (HECTOROL) injection 2 mcg  2 mcg Intravenous Q M,W,F-HD Lynnda Child,  PA-C   2 mcg at 12/08/16 1734  . Elbasvir-Grazoprevir 50-100 MG TABS 1 tablet  1 tablet Oral Daily Rondell A Smith, MD      . feeding supplement (ENSURE ENLIVE) (ENSURE ENLIVE) liquid 237 mL  237 mL Oral BID BM Jessica U Vann, DO   237 mL at 12/09/16 1331  . gabapentin (NEURONTIN) capsule 100 mg  100 mg Oral TID Norval Morton, MD   100 mg at 12/09/16 0832  . gi cocktail (Maalox,Lidocaine,Donnatal)  30 mL Oral QID PRN Norval Morton, MD      . heparin injection 5,000 Units  5,000 Units Subcutaneous Q8H Laren Everts, RPH   5,000 Units at 12/09/16 1331  . isosorbide-hydrALAZINE (BIDIL) 20-37.5 MG per tablet 1 tablet  1 tablet Oral TID Norval Morton, MD   1 tablet at 12/09/16 [phone]  . magnesium hydroxide (MILK OF MAGNESIA) suspension 15 mL  15 mL Oral Daily PRN Norval Morton, MD      . metoprolol tartrate (LOPRESSOR) tablet 25 mg  25 mg Oral BID Norval Morton, MD   25 mg at 12/09/16 8592  . morphine 2 MG/ML injection 1-2 mg  1-2 mg Intravenous Q2H PRN Geradine Girt, DO   1 mg at 12/09/16 1019  . NIFEdipine (PROCARDIA XL/ADALAT-CC) 24 hr tablet 90 mg  90 mg Oral Daily Norval Morton, MD   90 mg at 12/09/16 0833  . nitroGLYCERIN (NITROSTAT) SL tablet 0.4 mg  0.4 mg Sublingual Q5 min PRN Norval Morton, MD   0.4 mg at 12/09/16 1002  . ondansetron (ZOFRAN) injection 4 mg  4 mg Intravenous Q6H PRN Norval Morton, MD         Discharge Medications: Please see discharge summary for a list of discharge medications.  Relevant Imaging Results:  Relevant Lab Results:   Additional Information SSN [id number] Patient will need to transitions to ALF after [name] or long term care.  Rigoberto Noel, LCSW

## 2016-12-09 NOTE — Clinical Social Work Note (Signed)
Messages left for both son and daughter of patient per patient's request. CSW waiting for call back. CSW has also reached out to [place] at [facility] [phone] Ext [phone], message left.    Liz Beach MSW, [place], [place], [phone]

## 2016-12-09 NOTE — Progress Notes (Signed)
PROGRESS NOTE    Trevor [name]  TKW:[id number] DOB: 1945/12/04 DOA: 12/07/2016 PCP: [facility]   Outpatient Specialists:    Brief Narrative:  Trevor [name] is a 71 y.o. 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 fib, AAA, CVA, who presents with complaints of chest pain at approximately 8 PM while sitting watching the end of [place] football game. Patient states that the pain was sharp and on the left side of his chest. Patient denies radiation of pain. Nothing alleviated or worsened symptoms. Associated symptoms included nausea. Patient denies any diaphoresis, palpitations, shortness of breath, vomiting, fever, chills, lightheadedness, or loss of consciousness. Symptoms felt similar to previous heart attack and therefore called 911. Patient was just admitted into the hospital last month for chest pain undergoing cardiac cath revealed 60% stenosis to the CFX and to the Dx1.   Assessment & Plan:   Principal Problem:   Chest pain   Chest pain with Elevated troponin:  -recent cath 10/2016- stable disease with 60% mid LCx and 60% Dx1 -troponin flat possibly from demand ischemia -no further intervention from cardiology  ESRD on HD( M/W/F) - Called HD voicemail and left message regarding patient being admitted   HTN urgency -BP initally in the 200s -improved on home BP meds  HLD -statin  AAA:  Last assessment of his AAA was by duplex ultrasound 03/08/2015. This demonstrated a 2.6 x 2.7 cm infrarenal AAA. Needs to repeat study    Combined Systolic + Diastolic HF: echo 08/2425 showed an EF of 45-50% with grade 1DD. Volume appears stable. He denies dyspnea. BP currently controlled. Continue BB and Bidil. No ACE/ARB given CKD.   Hepatitis C: Pt reports this is a new diagnosis, being followed and management by the [facility].   There is concern that patient has not been able to take medications at home and may not be safe by  himself-- will benefit from [name] once chest pain resolved   DVT prophylaxis:  SQ Heparin  Code Status: Full Code   Family Communication: patient  Disposition Plan:  [name] vs home   Consultants:   cards     Subjective: C/o chest pain this AM- relieved with nitro/morphine  Objective: Vitals:   12/09/16 0600 12/09/16 0927 12/09/16 1002 12/09/16 1008  BP:   (!) 122/52 (!) 91/50  Pulse:      Resp:      Temp:      TempSrc:      SpO2:  100%    Weight: 58.9 kg (129 lb 12.8 oz)     Height:        Intake/Output Summary (Last 24 hours) at 12/09/16 1240 Last data filed at 12/09/16 0920  Gross per 24 hour  Intake              240 ml  Output               83 ml  Net              157 ml   Filed Weights   12/08/16 1453 12/08/16 1800 12/09/16 0600  Weight: 59.7 kg (131 lb 9.8 oz) 59.7 kg (131 lb 9.8 oz) 58.9 kg (129 lb 12.8 oz)    Examination:  General exam: chronically ill male Respiratory system: Clear to auscultation. Respiratory effort normal. Cardiovascular system: S1 & S2 heard, RRR. No JVD, murmurs, rubs, gallops or clicks. No pedal edema. Gastrointestinal system: Abdomen is  nondistended, soft and nontender. No organomegaly or masses felt. Normal bowel sounds heard. Central nervous system: Alert    Data Reviewed: I have personally reviewed following labs and imaging studies  CBC:  Recent Labs Lab 12/07/16 2200 12/08/16 1504  WBC 6.0 5.1  NEUTROABS 3.2  --   HGB 9.0* 8.1*  HCT 26.7* 23.8*  MCV 88.1 87.5  PLT 170 470   Basic Metabolic Panel:  Recent Labs Lab 12/07/16 2200 12/08/16 1504  NA 142 142  K 4.0 4.5  CL 105 104  CO2 24 28  GLUCOSE 82 103*  BUN 32* 37*  CREATININE 8.96* 10.46*  CALCIUM 9.5 9.1  PHOS  --  4.2   GFR: Estimated Creatinine Clearance: 5.4 mL/min (by C-G formula based on SCr of 10.46 mg/dL (H)). Liver Function Tests:  Recent Labs Lab 12/08/16 1504  ALBUMIN 2.9*   No results for input(s): LIPASE, AMYLASE in the  last 168 hours. No results for input(s): AMMONIA in the last 168 hours. Coagulation Profile: No results for input(s): INR, PROTIME in the last 168 hours. Cardiac Enzymes:  Recent Labs Lab 12/08/16 0335 12/08/16 0630 12/08/16 1026  TROPONINI 0.29* 0.34* 0.38*   BNP (last 3 results) No results for input(s): PROBNP in the last 8760 hours. HbA1C: No results for input(s): HGBA1C in the last 72 hours. CBG: No results for input(s): GLUCAP in the last 168 hours. Lipid Profile: No results for input(s): CHOL, HDL, LDLCALC, TRIG, CHOLHDL, LDLDIRECT in the last 72 hours. Thyroid Function Tests: No results for input(s): TSH, T4TOTAL, FREET4, T3FREE, THYROIDAB in the last 72 hours. Anemia Panel: No results for input(s): VITAMINB12, FOLATE, FERRITIN, TIBC, IRON, RETICCTPCT in the last 72 hours. Urine analysis:    Component Value Date/Time   COLORURINE YELLOW 12/26/2015 [place] 12/26/2015 1738   LABSPEC 1.012 12/26/2015 1738   PHURINE 8.5 (H) 12/26/2015 1738   GLUCOSEU NEGATIVE 12/26/2015 1738   HGBUR NEGATIVE 12/26/2015 1738   BILIRUBINUR NEGATIVE 12/26/2015 1738   KETONESUR NEGATIVE 12/26/2015 1738   PROTEINUR 100 (A) 12/26/2015 1738   UROBILINOGEN 0.2 01/08/2015 2116   NITRITE NEGATIVE 12/26/2015 1738   LEUKOCYTESUR NEGATIVE 12/26/2015 1738     ) Recent Results (from the past 240 hour(s))  MRSA PCR Screening     Status: None   Collection Time: 12/08/16  6:39 AM  Result Value Ref Range Status   MRSA by PCR NEGATIVE NEGATIVE Final    Comment:        The GeneXpert MRSA Assay (FDA approved for NASAL specimens only), is one component of a comprehensive MRSA colonization surveillance program. It is not intended to diagnose MRSA infection nor to guide or monitor treatment for MRSA infections.       Anti-infectives    Start     Dose/Rate Route Frequency Ordered Stop   12/08/16 1000  Elbasvir-Grazoprevir 50-100 MG TABS 1 tablet     1 tablet Oral Daily  12/08/16 0502         Radiology Studies: Dg Chest 2 View  Result Date: 12/08/2016 CLINICAL DATA:  71 y/o  M; left-sided chest pain. EXAM: CHEST  2 VIEW COMPARISON:  11/01/2016 chest radiograph FINDINGS: The heart size and mediastinal contours are within normal limits and stable. Aortic atherosclerosis with calcifications. Both lungs are clear. The visualized skeletal structures are unremarkable. IMPRESSION: No active cardiopulmonary disease. Electronically Signed   By: Kristine Garbe M.D.   On: 12/08/2016 00:29        Scheduled Meds: .  aspirin  81 mg Oral Daily  . atorvastatin  40 mg Oral QHS  . calcium acetate  1,334 mg Oral TID AC  . clopidogrel  75 mg Oral Daily  . docusate sodium  200 mg Oral Daily  . doxercalciferol  2 mcg Intravenous Q M,W,F-HD  . Elbasvir-Grazoprevir  1 tablet Oral Daily  . feeding supplement (ENSURE ENLIVE)  237 mL Oral BID BM  . gabapentin  100 mg Oral TID  . heparin subcutaneous  5,000 Units Subcutaneous Q8H  . isosorbide-hydrALAZINE  1 tablet Oral TID  . metoprolol tartrate  25 mg Oral BID  . NIFEdipine  90 mg Oral Daily   Continuous Infusions:   LOS: 1 day    Time spent: 35 min    [place], DO [facility] Pager [phone]  If 7PM-7AM, please contact night-coverage [email or web address] Password [email or web address] 12/09/2016, 12:40 PM

## 2016-12-09 NOTE — Progress Notes (Signed)
Pt  complaint 5/10 chest heaviness with shortness of breath.  Pt placed on 2lL [place], EKG obtained and pt received 1 sublingual nitro which dropped BP from 122/52 to 91/50.  Pt states he wants another nitro because his chest heaviness is still a 4/10.  Md notified and orders received to give pt IV morphine, will continue to closely monitor.

## 2016-12-09 NOTE — Progress Notes (Signed)
[facility]  Patient Status: Active (receiving services up to time of hospitalization)  [facility] is providing the following services: RN and MSW  If patient discharges after hours, please call [phone].   Trevor [name] 12/09/2016, 10:39 AM

## 2016-12-09 NOTE — Telephone Encounter (Signed)
Received Home Health Certification and Plan of Care from [facility].  Certification Period: 11/11/16-01/09/17  Reviewed and signed by Dr. Gwenlyn Found. Faxed to number provided.

## 2016-12-09 NOTE — Progress Notes (Signed)
[facility] Progress Note   Subjective: BP's low, getting all his home BP meds .  No c/o's. Doesn't feel very good.  Last BP 91/50. HD yesterday, no fluid off.   Vitals:   12/09/16 0600 12/09/16 0927 12/09/16 1002 12/09/16 1008  BP:   (!) 122/52 (!) 91/50  Pulse:      Resp:      Temp:      TempSrc:      SpO2:  100%    Weight: 58.9 kg (129 lb 12.8 oz)     Height:        Inpatient medications: . aspirin  81 mg Oral Daily  . atorvastatin  40 mg Oral QHS  . calcium acetate  1,334 mg Oral TID AC  . clopidogrel  75 mg Oral Daily  . docusate sodium  200 mg Oral Daily  . doxercalciferol  2 mcg Intravenous Q M,W,F-HD  . Elbasvir-Grazoprevir  1 tablet Oral Daily  . feeding supplement (ENSURE ENLIVE)  237 mL Oral BID BM  . gabapentin  100 mg Oral TID  . heparin subcutaneous  5,000 Units Subcutaneous Q8H  . isosorbide-hydrALAZINE  1 tablet Oral TID  . metoprolol tartrate  25 mg Oral BID  . NIFEdipine  90 mg Oral Daily    acetaminophen, gi cocktail, magnesium hydroxide, morphine injection, nitroGLYCERIN, ondansetron (ZOFRAN) IV  Exam: General: Very pleasant thin elderly AAM NAD Head: NCAT sclera not icteric MMM  Neck: Supple. No JVD  Lungs: CTA bilaterally without wheezes, rales, or rhonchi. Breathing is unlabored. Heart: RRR with S1 S2.  Abdomen: soft NT + BS Lower extremities: dry flaky skin without edema or ischemic changes, no open wounds  Neuro: A & O  X 3. Moves all extremities spontaneously. Psych:  Responds to questions appropriately with a normal affect. Dialysis Access: LUE AVF +thrill/bruit   Dialysis Orders:  [place] MWF  4h    2K/2.25Ca   P4    60.5kg   Hep 1900   LUE AVF  Hectorol 2 mcg IV q HD Venofer 50 mg IV q week Mircera 30 mcg IV q 2 weeks  OP Labs: Hgb 9.4 Tsat 50% Ph 6.8 Ca 8.9 PTH 571   Assessment: 1.  Chest pain -acute onset with h/o of MI - resolved w/ NTG 2.  ESRD -  MWF HD 3.  Hypertension - BP elevated on admission, on Coreg  bid/Bidil tid/Procardia qd; notes in OP record that he may be having a hard time remembering to take meds.  BP's now low, will have to cut back on BP meds given here and will add hold / stop orders as needed as well.   4. Volume - CXR clear/no edema on exam, under dry wt 1kg 5. Anemia  - Hgb stable/ cont ESA/Fe  6. Metabolic bone disease -  Cont VDRA/PhosLo 7. Nutrition - Renal diet/vitamins  Plan - HD tomorrow, min UF. Will decrease BP meds and add hold orders.    Kelly Splinter MD [facility] pager [phone]   12/09/2016, 2:03 PM    Recent Labs Lab 12/07/16 2200 12/08/16 1504  NA 142 142  K 4.0 4.5  CL 105 104  CO2 24 28  GLUCOSE 82 103*  BUN 32* 37*  CREATININE 8.96* 10.46*  CALCIUM 9.5 9.1  PHOS  --  4.2    Recent Labs Lab 12/08/16 1504  ALBUMIN 2.9*    Recent Labs Lab 12/07/16 2200 12/08/16 1504  WBC 6.0 5.1  NEUTROABS 3.2  --  HGB 9.0* 8.1*  HCT 26.7* 23.8*  MCV 88.1 87.5  PLT 170 175   Iron/TIBC/Ferritin/ %Sat    Component Value Date/Time   IRON 63 05/20/2014 0630   TIBC 198 (L) 05/20/2014 0630   FERRITIN 494 (H) 05/20/2014 0630   IRONPCTSAT 32 05/20/2014 0630

## 2016-12-10 DIAGNOSIS — I159 Secondary hypertension, unspecified: Secondary | ICD-10-CM

## 2016-12-10 LAB — RENAL FUNCTION PANEL
Albumin: 3 g/dL — ABNORMAL LOW (ref 3.5–5.0)
Anion gap: 9 (ref 5–15)
BUN: 9 mg/dL (ref 6–20)
CHLORIDE: 98 mmol/L — AB (ref 101–111)
CO2: 27 mmol/L (ref 22–32)
CREATININE: 3 mg/dL — AB (ref 0.61–1.24)
Calcium: 8.8 mg/dL — ABNORMAL LOW (ref 8.9–10.3)
GFR calc Af Amer: 23 mL/min — ABNORMAL LOW (ref >60)
GFR, EST NON AFRICAN AMERICAN: 20 mL/min — AB (ref >60)
Glucose, Bld: 89 mg/dL (ref 65–99)
POTASSIUM: 2.9 mmol/L — AB (ref 3.5–5.1)
Phosphorus: 1.5 mg/dL — ABNORMAL LOW (ref 2.5–4.6)
Sodium: 134 mmol/L — ABNORMAL LOW (ref 135–145)

## 2016-12-10 LAB — CBC
HEMATOCRIT: 23.7 % — AB (ref 39.0–52.0)
Hemoglobin: 8.2 g/dL — ABNORMAL LOW (ref 13.0–17.0)
MCH: 30 pg (ref 26.0–34.0)
MCHC: 34.6 g/dL (ref 30.0–36.0)
MCV: 86.8 fL (ref 78.0–100.0)
PLATELETS: 157 10*3/uL (ref 150–400)
RBC: 2.73 MIL/uL — ABNORMAL LOW (ref 4.22–5.81)
RDW: 13.6 % (ref 11.5–15.5)
WBC: 4.3 10*3/uL (ref 4.0–10.5)

## 2016-12-10 LAB — BASIC METABOLIC PANEL
Anion gap: 8 (ref 5–15)
BUN: 8 mg/dL (ref 6–20)
CHLORIDE: 98 mmol/L — AB (ref 101–111)
CO2: 30 mmol/L (ref 22–32)
Calcium: 8.9 mg/dL (ref 8.9–10.3)
Creatinine, Ser: 3.65 mg/dL — ABNORMAL HIGH (ref 0.61–1.24)
GFR calc non Af Amer: 15 mL/min — ABNORMAL LOW (ref >60)
GFR, EST AFRICAN AMERICAN: 18 mL/min — AB (ref >60)
Glucose, Bld: 120 mg/dL — ABNORMAL HIGH (ref 65–99)
POTASSIUM: 3.5 mmol/L (ref 3.5–5.1)
SODIUM: 136 mmol/L (ref 135–145)

## 2016-12-10 LAB — DRUG SCREEN 10 W/CONF, SERUM
AMPHETAMINES, IA: NEGATIVE ng/mL
BARBITURATES, IA: NEGATIVE ug/mL
Benzodiazepines, IA: NEGATIVE ng/mL
COCAINE & METABOLITE, IA: NEGATIVE ng/mL
METHADONE, IA: NEGATIVE ng/mL
OXYCODONES, IA: NEGATIVE ng/mL
Opiates, IA: NEGATIVE ng/mL
PHENCYCLIDINE, IA: NEGATIVE ng/mL
PROPOXYPHENE, IA: NEGATIVE ng/mL
THC(Marijuana) Metabolite, IA: NEGATIVE ng/mL

## 2016-12-10 MED ORDER — HEPARIN SODIUM (PORCINE) 1000 UNIT/ML DIALYSIS: 1000.0000 [IU] | INTRAMUSCULAR | Status: DC | PRN

## 2016-12-10 MED ORDER — ALTEPLASE 2 MG IJ SOLR: 2.0000 mg | Freq: Once | INTRAMUSCULAR | Status: DC | PRN

## 2016-12-10 MED ORDER — HEPARIN SODIUM (PORCINE) 1000 UNIT/ML DIALYSIS: 20.0000 [IU]/kg | INTRAMUSCULAR | Status: DC | PRN

## 2016-12-10 MED ORDER — SODIUM CHLORIDE 0.9 % IV SOLN: 100.0000 mL | INTRAVENOUS | Status: DC | PRN

## 2016-12-10 MED ORDER — PENTAFLUOROPROP-TETRAFLUOROETH EX AERO: 1.0000 "application " | INHALATION_SPRAY | CUTANEOUS | Status: DC | PRN

## 2016-12-10 MED ORDER — HEPARIN SODIUM (PORCINE) 1000 UNIT/ML DIALYSIS: 1600.0000 [IU] | Freq: Once | INTRAMUSCULAR | Status: DC

## 2016-12-10 MED ORDER — LIDOCAINE HCL (PF) 1 % IJ SOLN: 5.0000 mL | INTRAMUSCULAR | Status: DC | PRN

## 2016-12-10 MED ORDER — DOXERCALCIFEROL 4 MCG/2ML IV SOLN
INTRAVENOUS | Status: AC
  Filled 2016-12-10: qty 2

## 2016-12-10 MED ORDER — LIDOCAINE-PRILOCAINE 2.5-2.5 % EX CREA: 1.0000 "application " | TOPICAL_CREAM | CUTANEOUS | Status: DC | PRN

## 2016-12-10 NOTE — Progress Notes (Signed)
PROGRESS NOTE    Trevor [name]  INO:[id number] DOB: 1946-05-28 DOA: 12/07/2016 PCP: [facility]   Outpatient Specialists:    Brief Narrative:  Trevor [name] is a 71 y.o. 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 fib, AAA, CVA, who presents with complaints of chest pain at approximately 8 PM while sitting watching the end of the [place] football game. Patient states that the pain was sharp and on the left side of his chest. Patient denies radiation of pain. Nothing alleviated or worsened symptoms. Associated symptoms included nausea. Patient denies any diaphoresis, palpitations, shortness of breath, vomiting, fever, chills, lightheadedness, or loss of consciousness. Symptoms felt similar to previous heart attack and therefore called 911. Patient was just admitted into the hospital last month for chest pain undergoing cardiac cath revealed 60% stenosis to the CFX and to the Dx1.   Assessment & Plan:   Principal Problem:   Chest pain   Chest pain with Elevated troponin:  -recent cath 10/2016- stable disease with 60% mid LCx and 60% Dx1 -troponin flat possibly from demand ischemia -no further intervention from cardiology  ESRD on HD( M/W/F) - for HD today  HTN urgency -BP initally in the 200s -improved on home BP meds  HLD -statin  AAA:  Last assessment of his AAA was by duplex ultrasound 03/08/2015. This demonstrated a 2.6 x 2.7 cm infrarenal AAA. Needs to repeat study    Combined Systolic + Diastolic HF: echo 0/9628 showed an EF of 45-50% with grade 1DD. Volume appears stable. He denies dyspnea. BP currently controlled. Continue BB and Bidil. No ACE/ARB given CKD.   Hepatitis C: Pt reports this is a new diagnosis, being followed and management by the [facility] hospital.   There is concern that patient has not been able to take medications at home and may not be safe by himself-- will benefit from [name]- plan for d/c in  AM   DVT prophylaxis:  SQ Heparin  Code Status: Full Code   Family Communication: patient  Disposition Plan:  [name] in AM  Consultants:   cards     Subjective: No chest pain today-- just waking up  Objective: Vitals:   12/09/16 1447 12/09/16 2011 12/09/16 2016 12/10/16 0500  BP: 136/68 (!) 125/58  (!) 135/54  Pulse: 63  98 71  Resp: 20   16  Temp: 98.9 F (37.2 C)  98.4 F (36.9 C) 99 F (37.2 C)  TempSrc: Oral  Oral Oral  SpO2: 98%   100%  Weight:      Height:        Intake/Output Summary (Last 24 hours) at 12/10/16 1044 Last data filed at 12/09/16 2015  Gross per 24 hour  Intake              480 ml  Output                0 ml  Net              480 ml   Filed Weights   12/08/16 1453 12/08/16 1800 12/09/16 0600  Weight: 59.7 kg (131 lb 9.8 oz) 59.7 kg (131 lb 9.8 oz) 58.9 kg (129 lb 12.8 oz)    Examination:  General exam: chronically ill male Respiratory system: Clear to auscultation. Respiratory effort normal. Cardiovascular system: S1 & S2 heard, RRR. No JVD, murmurs, rubs, gallops or clicks. No pedal edema. Gastrointestinal system: Abdomen is nondistended, soft and nontender.  No organomegaly or masses felt. Normal bowel sounds heard. Central nervous system: Alert    Data Reviewed: I have personally reviewed following labs and imaging studies  CBC:  Recent Labs Lab 12/07/16 2200 12/08/16 1504  WBC 6.0 5.1  NEUTROABS 3.2  --   HGB 9.0* 8.1*  HCT 26.7* 23.8*  MCV 88.1 87.5  PLT 170 025   Basic Metabolic Panel:  Recent Labs Lab 12/07/16 2200 12/08/16 1504  NA 142 142  K 4.0 4.5  CL 105 104  CO2 24 28  GLUCOSE 82 103*  BUN 32* 37*  CREATININE 8.96* 10.46*  CALCIUM 9.5 9.1  PHOS  --  4.2   GFR: Estimated Creatinine Clearance: 5.4 mL/min (by C-G formula based on SCr of 10.46 mg/dL (H)). Liver Function Tests:  Recent Labs Lab 12/08/16 1504  ALBUMIN 2.9*   No results for input(s): LIPASE, AMYLASE in the last 168  hours. No results for input(s): AMMONIA in the last 168 hours. Coagulation Profile: No results for input(s): INR, PROTIME in the last 168 hours. Cardiac Enzymes:  Recent Labs Lab 12/08/16 0335 12/08/16 0630 12/08/16 1026  TROPONINI 0.29* 0.34* 0.38*   BNP (last 3 results) No results for input(s): PROBNP in the last 8760 hours. HbA1C: No results for input(s): HGBA1C in the last 72 hours. CBG: No results for input(s): GLUCAP in the last 168 hours. Lipid Profile: No results for input(s): CHOL, HDL, LDLCALC, TRIG, CHOLHDL, LDLDIRECT in the last 72 hours. Thyroid Function Tests: No results for input(s): TSH, T4TOTAL, FREET4, T3FREE, THYROIDAB in the last 72 hours. Anemia Panel: No results for input(s): VITAMINB12, FOLATE, FERRITIN, TIBC, IRON, RETICCTPCT in the last 72 hours. Urine analysis:    Component Value Date/Time   COLORURINE YELLOW 12/26/2015 [place] 12/26/2015 1738   LABSPEC 1.012 12/26/2015 1738   PHURINE 8.5 (H) 12/26/2015 1738   GLUCOSEU NEGATIVE 12/26/2015 1738   HGBUR NEGATIVE 12/26/2015 1738   BILIRUBINUR NEGATIVE 12/26/2015 1738   KETONESUR NEGATIVE 12/26/2015 1738   PROTEINUR 100 (A) 12/26/2015 1738   UROBILINOGEN 0.2 01/08/2015 2116   NITRITE NEGATIVE 12/26/2015 1738   LEUKOCYTESUR NEGATIVE 12/26/2015 1738     ) Recent Results (from the past 240 hour(s))  MRSA PCR Screening     Status: None   Collection Time: 12/08/16  6:39 AM  Result Value Ref Range Status   MRSA by PCR NEGATIVE NEGATIVE Final    Comment:        The GeneXpert MRSA Assay (FDA approved for NASAL specimens only), is one component of a comprehensive MRSA colonization surveillance program. It is not intended to diagnose MRSA infection nor to guide or monitor treatment for MRSA infections.       Anti-infectives    Start     Dose/Rate Route Frequency Ordered Stop   12/08/16 1000  Elbasvir-Grazoprevir 50-100 MG TABS 1 tablet     1 tablet Oral Daily 12/08/16  0502         Radiology Studies: No results found.      Scheduled Meds: . aspirin  81 mg Oral Daily  . atorvastatin  40 mg Oral QHS  . calcium acetate  1,334 mg Oral TID AC  . clopidogrel  75 mg Oral Daily  . docusate sodium  200 mg Oral Daily  . doxercalciferol  2 mcg Intravenous Q M,W,F-HD  . Elbasvir-Grazoprevir  1 tablet Oral Daily  . feeding supplement (NEPRO CARB STEADY)  237 mL Oral BID BM  . gabapentin  100  mg Oral TID  . heparin subcutaneous  5,000 Units Subcutaneous Q8H  . isosorbide-hydrALAZINE  1 tablet Oral TID  . metoprolol tartrate  25 mg Oral BID  . NIFEdipine  60 mg Oral Daily   Continuous Infusions:   LOS: 2 days    Time spent: 25 min    [place], DO [facility] Pager [phone]  If 7PM-7AM, please contact night-coverage [email or web address] Password [email or web address] 12/10/2016, 10:44 AM

## 2016-12-10 NOTE — Progress Notes (Signed)
[facility] Progress Note   Subjective: too weak to get OOB, plan is for [name] placement prob tomorrow.    Vitals:   12/09/16 1447 12/09/16 2011 12/09/16 2016 12/10/16 0500  BP: 136/68 (!) 125/58  (!) 135/54  Pulse: 63  98 71  Resp: 20   16  Temp: 98.9 F (37.2 C)  98.4 F (36.9 C) 99 F (37.2 C)  TempSrc: Oral  Oral Oral  SpO2: 98%   100%  Weight:      Height:        Inpatient medications: . aspirin  81 mg Oral Daily  . atorvastatin  40 mg Oral QHS  . calcium acetate  1,334 mg Oral TID AC  . clopidogrel  75 mg Oral Daily  . docusate sodium  200 mg Oral Daily  . doxercalciferol  2 mcg Intravenous Q M,W,F-HD  . Elbasvir-Grazoprevir  1 tablet Oral Daily  . feeding supplement (NEPRO CARB STEADY)  237 mL Oral BID BM  . gabapentin  100 mg Oral TID  . heparin subcutaneous  5,000 Units Subcutaneous Q8H  . isosorbide-hydrALAZINE  1 tablet Oral TID  . metoprolol tartrate  25 mg Oral BID  . NIFEdipine  60 mg Oral Daily    acetaminophen, gi cocktail, magnesium hydroxide, morphine injection, nitroGLYCERIN, ondansetron (ZOFRAN) IV  Exam: General: Very pleasant thin elderly AAM NAD Head: NCAT sclera not icteric MMM  Neck: Supple. No JVD  Lungs: CTA bilaterally without wheezes, rales, or rhonchi. Breathing is unlabored. Heart: RRR with S1 S2.  Abdomen: soft NT + BS Lower extremities: dry flaky skin without edema or ischemic changes, no open wounds  Neuro: A & O  X 3. Moves all extremities spontaneously. Psych:  Responds to questions appropriately with a normal affect. Dialysis Access: LUE AVF +thrill/bruit   Dialysis Orders:  [place] MWF  4h    2K/2.25Ca   P4    60.5kg   Hep 1900   LUE AVF  Hectorol 2 mcg IV q HD Venofer 50 mg IV q week Mircera 30 mcg IV q 2 weeks  OP Labs: Hgb 9.4 Tsat 50% Ph 6.8 Ca 8.9 PTH 571   Assessment: 1.  Chest pain -acute onset with h/o of MI - resolved w/ NTG. Recent cath 12/17, medical Rx 2.  ESRD -  MWF HD 3.  Hypertension - BP  elevated on admission, then too low on all meds. Better today. 4. Volume - CXR clear/no edema on exam, under dry wt 1kg 5. Anemia  - Hgb stable/ cont ESA/Fe  6. Metabolic bone disease -  Cont VDRA/PhosLo 7. Nutrition - Renal diet/vitamins  Plan - for SNFP.  HD today, min UF.    Kelly Splinter MD [facility] pager [phone]   12/10/2016, 10:57 AM    Recent Labs Lab 12/07/16 2200 12/08/16 1504  NA 142 142  K 4.0 4.5  CL 105 104  CO2 24 28  GLUCOSE 82 103*  BUN 32* 37*  CREATININE 8.96* 10.46*  CALCIUM 9.5 9.1  PHOS  --  4.2    Recent Labs Lab 12/08/16 1504  ALBUMIN 2.9*    Recent Labs Lab 12/07/16 2200 12/08/16 1504  WBC 6.0 5.1  NEUTROABS 3.2  --   HGB 9.0* 8.1*  HCT 26.7* 23.8*  MCV 88.1 87.5  PLT 170 175   Iron/TIBC/Ferritin/ %Sat    Component Value Date/Time   IRON 63 05/20/2014 0630   TIBC 198 (L) 05/20/2014 0630   FERRITIN 494 (H) 05/20/2014  0630   IRONPCTSAT 32 05/20/2014 0630

## 2016-12-11 DIAGNOSIS — I4891 Unspecified atrial fibrillation: Secondary | ICD-10-CM | POA: Diagnosis not present

## 2016-12-11 DIAGNOSIS — K219 Gastro-esophageal reflux disease without esophagitis: Secondary | ICD-10-CM | POA: Diagnosis not present

## 2016-12-11 DIAGNOSIS — M6281 Muscle weakness (generalized): Secondary | ICD-10-CM | POA: Diagnosis not present

## 2016-12-11 DIAGNOSIS — R0789 Other chest pain: Secondary | ICD-10-CM | POA: Diagnosis not present

## 2016-12-11 DIAGNOSIS — Z9119 Patient's noncompliance with other medical treatment and regimen: Secondary | ICD-10-CM | POA: Diagnosis not present

## 2016-12-11 DIAGNOSIS — I2510 Atherosclerotic heart disease of native coronary artery without angina pectoris: Secondary | ICD-10-CM | POA: Diagnosis not present

## 2016-12-11 DIAGNOSIS — Z992 Dependence on renal dialysis: Secondary | ICD-10-CM | POA: Diagnosis not present

## 2016-12-11 DIAGNOSIS — R079 Chest pain, unspecified: Secondary | ICD-10-CM | POA: Diagnosis not present

## 2016-12-11 DIAGNOSIS — R2689 Other abnormalities of gait and mobility: Secondary | ICD-10-CM | POA: Diagnosis not present

## 2016-12-11 DIAGNOSIS — E785 Hyperlipidemia, unspecified: Secondary | ICD-10-CM | POA: Diagnosis not present

## 2016-12-11 DIAGNOSIS — G629 Polyneuropathy, unspecified: Secondary | ICD-10-CM | POA: Diagnosis not present

## 2016-12-11 DIAGNOSIS — N186 End stage renal disease: Secondary | ICD-10-CM | POA: Diagnosis not present

## 2016-12-11 DIAGNOSIS — I639 Cerebral infarction, unspecified: Secondary | ICD-10-CM | POA: Diagnosis not present

## 2016-12-11 DIAGNOSIS — I427 Cardiomyopathy due to drug and external agent: Secondary | ICD-10-CM | POA: Diagnosis not present

## 2016-12-11 DIAGNOSIS — I5040 Unspecified combined systolic (congestive) and diastolic (congestive) heart failure: Secondary | ICD-10-CM | POA: Diagnosis not present

## 2016-12-11 DIAGNOSIS — I509 Heart failure, unspecified: Secondary | ICD-10-CM | POA: Diagnosis not present

## 2016-12-11 DIAGNOSIS — R531 Weakness: Secondary | ICD-10-CM | POA: Diagnosis not present

## 2016-12-11 DIAGNOSIS — I120 Hypertensive chronic kidney disease with stage 5 chronic kidney disease or end stage renal disease: Secondary | ICD-10-CM | POA: Diagnosis not present

## 2016-12-11 DIAGNOSIS — I10 Essential (primary) hypertension: Secondary | ICD-10-CM | POA: Diagnosis not present

## 2016-12-11 DIAGNOSIS — N2581 Secondary hyperparathyroidism of renal origin: Secondary | ICD-10-CM | POA: Diagnosis not present

## 2016-12-11 DIAGNOSIS — J9620 Acute and chronic respiratory failure, unspecified whether with hypoxia or hypercapnia: Secondary | ICD-10-CM | POA: Diagnosis not present

## 2016-12-11 DIAGNOSIS — N400 Enlarged prostate without lower urinary tract symptoms: Secondary | ICD-10-CM | POA: Diagnosis not present

## 2016-12-11 DIAGNOSIS — D631 Anemia in chronic kidney disease: Secondary | ICD-10-CM | POA: Diagnosis not present

## 2016-12-11 DIAGNOSIS — K739 Chronic hepatitis, unspecified: Secondary | ICD-10-CM | POA: Diagnosis not present

## 2016-12-11 DIAGNOSIS — E43 Unspecified severe protein-calorie malnutrition: Secondary | ICD-10-CM | POA: Diagnosis not present

## 2016-12-11 DIAGNOSIS — R262 Difficulty in walking, not elsewhere classified: Secondary | ICD-10-CM | POA: Diagnosis not present

## 2016-12-11 DIAGNOSIS — G459 Transient cerebral ischemic attack, unspecified: Secondary | ICD-10-CM | POA: Diagnosis not present

## 2016-12-11 MED ORDER — NEPRO/CARBSTEADY PO LIQD: 237.0000 mL | Freq: Two times a day (BID) | ORAL | 0 refills | Status: DC

## 2016-12-11 MED ORDER — NIFEDIPINE ER 60 MG PO TB24: 60.0000 mg | ORAL_TABLET | Freq: Every day | ORAL | Status: DC

## 2016-12-11 MED ORDER — ACETAMINOPHEN 325 MG PO TABS: 650.0000 mg | ORAL_TABLET | ORAL | Status: DC | PRN

## 2016-12-11 NOTE — Plan of Care (Signed)
Problem: Safety: Goal: Ability to remain free from injury will improve Outcome: Progressing No injuries this shift. Fall risk bundle in place. Call light within reach, hourly rounding performed, bed alarm on and in lowest position. Pt has no needs or concerns at this time. Will continue to monitor and re-assess pt.

## 2016-12-11 NOTE — Plan of Care (Signed)
Problem: Education: Goal: Knowledge of [facility] Education information/materials will improve Outcome: Completed/Met Date Met: 12/11/16 Pt educated throughout entire admission regarding tests, procedures, labs, medications, and available resources   Problem: Safety: Goal: Ability to remain free from injury will improve Outcome: Completed/Met Date Met: 12/11/16 Pt has remained free from injury during this admission   Problem: Physical Regulation: Goal: Will remain free from infection Outcome: Completed/Met Date Met: 12/11/16 Pt has remained free from infection during this admission    

## 2016-12-11 NOTE — Progress Notes (Signed)
[facility] Progress Note   Subjective: going to [name] today   Vitals:   12/10/16 2131 12/10/16 2209 12/11/16 0510 12/11/16 1307  BP: (!) 162/65  (!) 163/80 (!) 121/57  Pulse: 78  84 64  Resp:   18 16  Temp:  98.9 F (37.2 C) 99 F (37.2 C) 98.2 F (36.8 C)  TempSrc:  Oral Oral Oral  SpO2:   98% 98%  Weight:   63.2 kg (139 lb 5.3 oz)   Height:        Inpatient medications: . aspirin  81 mg Oral Daily  . atorvastatin  40 mg Oral QHS  . calcium acetate  1,334 mg Oral TID AC  . clopidogrel  75 mg Oral Daily  . docusate sodium  200 mg Oral Daily  . doxercalciferol  2 mcg Intravenous Q M,W,F-HD  . Elbasvir-Grazoprevir  1 tablet Oral Daily  . feeding supplement (NEPRO CARB STEADY)  237 mL Oral BID BM  . gabapentin  100 mg Oral TID  . heparin subcutaneous  5,000 Units Subcutaneous Q8H  . isosorbide-hydrALAZINE  1 tablet Oral TID  . metoprolol tartrate  25 mg Oral BID  . NIFEdipine  60 mg Oral Daily    acetaminophen, gi cocktail, magnesium hydroxide, morphine injection, nitroGLYCERIN, ondansetron (ZOFRAN) IV  Exam: General: Very pleasant thin elderly AAM NAD Head: NCAT sclera not icteric MMM  Neck: Supple. No JVD  Lungs: CTA bilaterally without wheezes, rales, or rhonchi. Breathing is unlabored. Heart: RRR with S1 S2.  Abdomen: soft NT + BS Lower extremities: dry flaky skin without edema or ischemic changes, no open wounds  Neuro: A & O  X 3. Moves all extremities spontaneously. Psych:  Responds to questions appropriately with a normal affect. Dialysis Access: LUE AVF +thrill/bruit   Dialysis Orders:  [facility] MWF  4h    2K/2.25Ca   P4    60.5kg   Hep 1900   LUE AVF  Hectorol 2 mcg IV q HD Venofer 50 mg IV q week Mircera 30 mcg IV q 2 weeks  OP Labs: Hgb 9.4 Tsat 50% Ph 6.8 Ca 8.9 PTH 571   Assessment: 1.  Chest pain -acute onset with h/o of MI - resolved w/ NTG. Recent cath 12/17, medical Rx 2.  ESRD -  MWF HD 3.  Hypertension - BP elevated on  admission, then too low on all meds. Better today. 4. Volume - CXR clear/no edema on exam, under dry wt 1kg 5. Anemia  - Hgb stable/ cont ESA/Fe  6. Metabolic bone disease -  Cont VDRA/PhosLo 7. Nutrition - Renal diet/vitamins  Plan - for dc to [name] today   Kelly Splinter MD [facility] pager [phone]   12/11/2016, 1:47 PM    Recent Labs Lab 12/08/16 1504 12/10/16 1834 12/10/16 2205  NA 142 134* 136  K 4.5 2.9* 3.5  CL 104 98* 98*  CO2 28 27 30   GLUCOSE 103* 89 120*  BUN 37* 9 8  CREATININE 10.46* 3.00* 3.65*  CALCIUM 9.1 8.8* 8.9  PHOS 4.2 1.5*  --     Recent Labs Lab 12/08/16 1504 12/10/16 1834  ALBUMIN 2.9* 3.0*    Recent Labs Lab 12/07/16 2200 12/08/16 1504 12/10/16 1834  WBC 6.0 5.1 4.3  NEUTROABS 3.2  --   --   HGB 9.0* 8.1* 8.2*  HCT 26.7* 23.8* 23.7*  MCV 88.1 87.5 86.8  PLT 170 175 157   Iron/TIBC/Ferritin/ %Sat    Component Value Date/Time  IRON 63 05/20/2014 0630   TIBC 198 (L) 05/20/2014 0630   FERRITIN 494 (H) 05/20/2014 0630   IRONPCTSAT 32 05/20/2014 0630

## 2016-12-11 NOTE — Discharge Summary (Signed)
Physician Discharge Summary  Trevor [name] FIE:[id number] DOB: 01-Oct-1946 DOA: 12/07/2016  PCP: [place] date: 12/07/2016 Discharge date: 12/11/2016   Recommendations for Outpatient Follow-Up:   1. To [name] 2. HD M/W/F 3. Repeat U/S duplex   Discharge Diagnosis:   Principal Problem:   Chest pain   Discharge disposition:   [name]:  Discharge Condition: Improved.  Diet recommendation: renal  Wound care: None.   History of Present Illness:   Trevor [name] is a 71 y.o. 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 fib, AAA, CVA, who presents with complaints of chest pain at approximately 8 PM while sitting watching the end of the [place] football game. Patient states that the pain was sharp and on the left side of his chest. Patient denies radiation of pain. Nothing alleviated or worsened symptoms. Associated symptoms included nausea. Patient denies any diaphoresis, palpitations, shortness of breath, vomiting, fever, chills, lightheadedness, or loss of consciousness. Symptoms felt similar to previous heart attack and therefore called 911. Patient was just admitted into the hospital last month for chest pain undergoing cardiac cath revealed 60% stenosis to the CFX and to the Dx1.  ED Course: Upon EMS arrival patient's blood pressure was noted to be in the 200s. Patient was given nitroglycerin in route with improvement of chest pain symptoms. Patient's initial troponin was negative, but repeat was elevated at 0.3. TRH called to admit for monitoring overnight    Hospital Course by Problem:   Chest pain with Elevated troponin:  -recent cath 10/2016- stable disease with 60% mid LCx and 60% Dx1 -troponin flat possibly from demand ischemia -no further intervention from cardiology  ESRD on HD( M/W/F) - for HD today  HTN urgency -BP initally in the 200s -improved on home BP meds  HLD -statin  AAA: Last  assessment of his AAA was by duplex ultrasound 03/08/2015. This demonstrated a 2.6 x 2.7 cm infrarenal AAA. Needs to repeat study    Combined Systolic + Diastolic HF: echo 12/6604 showed an EF of 45-50% with grade 1DD. Volume appears stable. He denies dyspnea. BP currently controlled. Continue BB and Bidil. No ACE/ARB given CKD.   Hepatitis C: Pt reports this is a new diagnosis, being followed and management by the [facility].     Medical Consultants:    Renal  cards   Discharge Exam:   Vitals:   12/10/16 2209 12/11/16 0510  BP:  (!) 163/80  Pulse:  84  Resp:  18  Temp: 98.9 F (37.2 C) 99 F (37.2 C)   Vitals:   12/10/16 1955 12/10/16 2131 12/10/16 2209 12/11/16 0510  BP: (!) 154/71 (!) 162/65  (!) 163/80  Pulse: 64 78  84  Resp: 18   18  Temp: 98.5 F (36.9 C)  98.9 F (37.2 C) 99 F (37.2 C)  TempSrc: Oral  Oral Oral  SpO2: 98%   98%  Weight: 62.1 kg (136 lb 14.5 oz)   63.2 kg (139 lb 5.3 oz)  Height:        Gen:  NAD- excited to go to rehab   The results of significant diagnostics from this hospitalization (including imaging, microbiology, ancillary and laboratory) are listed below for reference.     Procedures and Diagnostic Studies:   Dg Chest 2 View  Result Date: 12/08/2016 CLINICAL DATA:  71 y/o  M; left-sided chest pain. EXAM: CHEST  2 VIEW COMPARISON:  11/01/2016 chest radiograph FINDINGS:  The heart size and mediastinal contours are within normal limits and stable. Aortic atherosclerosis with calcifications. Both lungs are clear. The visualized skeletal structures are unremarkable. IMPRESSION: No active cardiopulmonary disease. Electronically Signed   By: Kristine Garbe M.D.   On: 12/08/2016 00:29     Labs:   Basic Metabolic Panel:  Recent Labs Lab 12/07/16 2200 12/08/16 1504 12/10/16 1834 12/10/16 2205  NA 142 142 134* 136  K 4.0 4.5 2.9* 3.5  CL 105 104 98* 98*  CO2 24 28 27 30   GLUCOSE 82 103* 89 120*  BUN 32* 37* 9 8    CREATININE 8.96* 10.46* 3.00* 3.65*  CALCIUM 9.5 9.1 8.8* 8.9  PHOS  --  4.2 1.5*  --    GFR Estimated Creatinine Clearance: 16.6 mL/min (by C-G formula based on SCr of 3.65 mg/dL (H)). Liver Function Tests:  Recent Labs Lab 12/08/16 1504 12/10/16 1834  ALBUMIN 2.9* 3.0*   No results for input(s): LIPASE, AMYLASE in the last 168 hours. No results for input(s): AMMONIA in the last 168 hours. Coagulation profile No results for input(s): INR, PROTIME in the last 168 hours.  CBC:  Recent Labs Lab 12/07/16 2200 12/08/16 1504 12/10/16 1834  WBC 6.0 5.1 4.3  NEUTROABS 3.2  --   --   HGB 9.0* 8.1* 8.2*  HCT 26.7* 23.8* 23.7*  MCV 88.1 87.5 86.8  PLT 170 175 157   Cardiac Enzymes:  Recent Labs Lab 12/08/16 0335 12/08/16 0630 12/08/16 1026  TROPONINI 0.29* 0.34* 0.38*   BNP: Invalid input(s): POCBNP CBG: No results for input(s): GLUCAP in the last 168 hours. D-Dimer No results for input(s): DDIMER in the last 72 hours. Hgb A1c No results for input(s): HGBA1C in the last 72 hours. Lipid Profile No results for input(s): CHOL, HDL, LDLCALC, TRIG, CHOLHDL, LDLDIRECT in the last 72 hours. Thyroid function studies No results for input(s): TSH, T4TOTAL, T3FREE, THYROIDAB in the last 72 hours.  Invalid input(s): FREET3 Anemia work up No results for input(s): VITAMINB12, FOLATE, FERRITIN, TIBC, IRON, RETICCTPCT in the last 72 hours. Microbiology Recent Results (from the past 240 hour(s))  MRSA PCR Screening     Status: None   Collection Time: 12/08/16  6:39 AM  Result Value Ref Range Status   MRSA by PCR NEGATIVE NEGATIVE Final    Comment:        The GeneXpert MRSA Assay (FDA approved for NASAL specimens only), is one component of a comprehensive MRSA colonization surveillance program. It is not intended to diagnose MRSA infection nor to guide or monitor treatment for MRSA infections.      Discharge Instructions:   Discharge Instructions    Discharge  instructions    Complete by:  As directed    Renal diet   Increase activity slowly    Complete by:  As directed      Allergies as of 12/11/2016   No Known Allergies     Medication List    STOP taking these medications   MILK OF MAGNESIA 400 MG/5ML suspension Generic drug:  magnesium hydroxide   OVER THE COUNTER MEDICATION     TAKE these medications   acetaminophen 325 MG tablet Commonly known as:  TYLENOL Take 2 tablets (650 mg total) by mouth every 4 (four) hours as needed for headache or mild pain.   aspirin 81 MG chewable tablet Chew 1 tablet (81 mg total) by mouth daily.   atorvastatin 40 MG tablet Commonly known as:  LIPITOR Take 40 mg  by mouth at bedtime.   calcium acetate 667 MG capsule Commonly known as:  PHOSLO Take 1,334 mg by mouth 3 (three) times daily before meals.   clopidogrel 75 MG tablet Commonly known as:  PLAVIX Take 1 tablet (75 mg total) by mouth daily.   docusate sodium 100 MG capsule Commonly known as:  COLACE Take 200 mg by mouth daily.   feeding supplement (NEPRO CARB STEADY) Liqd Take 237 mLs by mouth 2 (two) times daily between meals.   Fish Oil 1000 MG Caps Take 1,000 mg by mouth 2 (two) times daily.   gabapentin 100 MG capsule Commonly known as:  NEURONTIN Take 1 capsule (100 mg total) by mouth 3 (three) times daily.   isosorbide-hydrALAZINE 20-37.5 MG tablet Commonly known as:  BIDIL Take 1 tablet by mouth 3 (three) times daily.   metoprolol tartrate 25 MG tablet Commonly known as:  LOPRESSOR Take 1 tablet (25 mg total) by mouth 2 (two) times daily.   NIFEdipine 60 MG 24 hr tablet Commonly known as:  PROCARDIA-XL/ADALAT CC Take 1 tablet (60 mg total) by mouth daily. Start taking on:  12/12/2016 What changed:  medication strength  how much to take   ZEPATIER 50-100 MG Tabs Generic drug:  Elbasvir-Grazoprevir Take 1 tablet by mouth daily.      Follow-up Information    [facility] Follow up in 1  week(s).   Contact information: [place] [address] [phone]            Time coordinating discharge: 35 min  Signed:  Taleyah Hillman Alison Stalling   [facility] 12/11/2016, 9:39 AM

## 2016-12-11 NOTE — Progress Notes (Signed)
Physical Therapy Treatment Patient Details Name: Trevor [name] MRN: [id number] DOB: 1945/12/22 Today's Date: 12/11/2016    History of Present Illness pt presents with HTN and Chest Pain relieved by Nitro.  pt with recent admit in 10/2016 for NSTEMI with HTN Emergency.  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      PT Comments    Patient progressing well towards PT goals. Improved ambulation distance with Min guard assist for safety due to bil knee instability and fatigue. Pt fatigues during activity. Continues to be a fall risk. Eager to get to rehab so he can return to PLOF. Will follow.   Follow Up Recommendations  [name]     Equipment Recommendations  None recommended by PT    Recommendations for Other Services       Precautions / Restrictions Precautions Precautions: Fall Precaution Comments: pt visually impaired worse in R eye than L.  pt is able to make out the outlines of ojects, but not the details.   Restrictions Weight Bearing Restrictions: No    Mobility  Bed Mobility Overal bed mobility: Needs Assistance Bed Mobility: Supine to Sit;Sit to Supine     Supine to sit: Supervision Sit to supine: Supervision   General bed mobility comments: pt needs increased time, but no physical A needed.    Transfers Overall transfer level: Needs assistance Equipment used: Rolling walker (2 wheeled) Transfers: Sit to/from Stand Sit to Stand: Min guard         General transfer comment: Min guard for safety. No assist needed. + dizziness. "I can't stand in 1 place, have to move my legs"  Ambulation/Gait Ambulation/Gait assistance: Min guard Ambulation Distance (Feet): 130 Feet Assistive device: Rolling walker (2 wheeled) Gait Pattern/deviations: Step-through pattern;Decreased stride length;Trunk flexed Gait velocity: decreased Gait velocity interpretation: <1.8 ft/sec, indicative of risk for recurrent falls General Gait Details: Bil knee  instability noted but no overt buckling. 2/4 DOE. VSS. Fatigues.   Stairs            Wheelchair Mobility    Modified Rankin (Stroke Patients Only)       Balance Overall balance assessment: Needs assistance Sitting-balance support: Feet supported;No upper extremity supported Sitting balance-Leahy Scale: Good     Standing balance support: During functional activity;Bilateral upper extremity supported Standing balance-Leahy Scale: Poor                      Cognition Arousal/Alertness: Awake/alert Behavior During Therapy: WFL for tasks assessed/performed Overall Cognitive Status: History of cognitive impairments - at baseline                 General Comments: pt admits to memory deficits at baseline.     Exercises      General Comments        Pertinent Vitals/Pain Pain Assessment: No/denies pain    Home Living                      Prior Function            PT Goals (current goals can now be found in the care plan section) Progress towards PT goals: Progressing toward goals    Frequency    Min 2X/week      PT Plan Current plan remains appropriate    Co-evaluation             End of Session Equipment Utilized During Treatment: Gait belt  Activity Tolerance: Patient tolerated treatment well Patient left: in bed;with call bell/phone within reach;with bed alarm set     Time: 3374-4514 PT Time Calculation (min) (ACUTE ONLY): 18 min  Charges:  $Therapeutic Exercise: 8-22 mins                    G Codes:      Lakeesha Fontanilla A Jayliana Valencia 12/11/2016, 2:48 PM Wray Kearns, Ellis, DPT [phone]

## 2016-12-11 NOTE — Plan of Care (Signed)
Problem: Pain Managment: Goal: General experience of comfort will improve Outcome: Progressing Pt denied pain this shift. Hourly rounding performed. Will continue to monitor and assess for pain.

## 2016-12-11 NOTE — Clinical Social Work Placement (Signed)
   CLINICAL SOCIAL WORK PLACEMENT  NOTE 12/11/16 - DISCHARGED TO [facility] VIA AMBULANCE  Date:  12/11/2016  Patient Details  Name: Trevor [name] MRN: [id number] Date of Birth: [birth year]  Clinical Social Work is seeking post-discharge placement for this patient at the [place] level of care (*CSW will initial, date and re-position this form in  chart as items are completed):  Yes   Patient/family provided with [place] Work Department's list of facilities offering this level of care within the geographic area requested by the patient (or if unable, by the patient's family).  Yes   Patient/family informed of their freedom to choose among providers that offer the needed level of care, that participate in Medicare, Medicaid or managed care program needed by the patient, have an available bed and are willing to accept the patient.  Yes   Patient/family informed of [facility]'s ownership interest in [facility] and [facility], as well as of the fact that they are under no obligation to receive care at these facilities.  PASRR submitted to EDS on       PASRR number received on       Existing PASRR number confirmed on 12/09/16     FL2 transmitted to all facilities in geographic area requested by pt/family on 12/09/16     FL2 transmitted to all facilities within larger geographic area on       Patient informed that his/her managed care company has contracts with or will negotiate with certain facilities, including the following:         12/11/16 -  Patient/family informed of bed offers received.  Patient chooses bed at  [facility]     Physician recommends and patient chooses bed at      Patient to be transferred to  [facility]  on  12/11/16.  Patient to be transferred to facility by  ambulance     Patient family notified on  12/11/16 of transfer.  Name of family member notified:    Son Duwane Gewirtz by phone [phone]) and  sister Logyn Kendrick by phone [phone]) and at the bedside.  PHYSICIAN Please prepare priority discharge summary, including medications, Please prepare prescriptions, Please sign FL2     Additional Comment:    _______________________________________________ Trevor Feil, LCSW 12/11/2016, 7:13 PM

## 2016-12-11 NOTE — Progress Notes (Signed)
Called report to Probation officer at [facility].  All questions answered, assessment unchanged from earlier.

## 2017-01-03 DIAGNOSIS — N186 End stage renal disease: Secondary | ICD-10-CM | POA: Diagnosis not present

## 2017-01-03 DIAGNOSIS — I214 Non-ST elevation (NSTEMI) myocardial infarction: Secondary | ICD-10-CM | POA: Diagnosis not present

## 2017-01-03 DIAGNOSIS — H548 Legal blindness, as defined in USA: Secondary | ICD-10-CM | POA: Diagnosis not present

## 2017-01-03 DIAGNOSIS — I120 Hypertensive chronic kidney disease with stage 5 chronic kidney disease or end stage renal disease: Secondary | ICD-10-CM | POA: Diagnosis not present

## 2017-01-03 DIAGNOSIS — I2510 Atherosclerotic heart disease of native coronary artery without angina pectoris: Secondary | ICD-10-CM | POA: Diagnosis not present

## 2017-01-03 DIAGNOSIS — I69398 Other sequelae of cerebral infarction: Secondary | ICD-10-CM | POA: Diagnosis not present

## 2017-01-04 DIAGNOSIS — I69398 Other sequelae of cerebral infarction: Secondary | ICD-10-CM | POA: Diagnosis not present

## 2017-01-04 DIAGNOSIS — I2510 Atherosclerotic heart disease of native coronary artery without angina pectoris: Secondary | ICD-10-CM | POA: Diagnosis not present

## 2017-01-04 DIAGNOSIS — I214 Non-ST elevation (NSTEMI) myocardial infarction: Secondary | ICD-10-CM | POA: Diagnosis not present

## 2017-01-04 DIAGNOSIS — N186 End stage renal disease: Secondary | ICD-10-CM | POA: Diagnosis not present

## 2017-01-04 DIAGNOSIS — H548 Legal blindness, as defined in USA: Secondary | ICD-10-CM | POA: Diagnosis not present

## 2017-01-04 DIAGNOSIS — I120 Hypertensive chronic kidney disease with stage 5 chronic kidney disease or end stage renal disease: Secondary | ICD-10-CM | POA: Diagnosis not present

## 2017-01-06 DIAGNOSIS — I120 Hypertensive chronic kidney disease with stage 5 chronic kidney disease or end stage renal disease: Secondary | ICD-10-CM | POA: Diagnosis not present

## 2017-01-06 DIAGNOSIS — H548 Legal blindness, as defined in USA: Secondary | ICD-10-CM | POA: Diagnosis not present

## 2017-01-06 DIAGNOSIS — I69398 Other sequelae of cerebral infarction: Secondary | ICD-10-CM | POA: Diagnosis not present

## 2017-01-06 DIAGNOSIS — I214 Non-ST elevation (NSTEMI) myocardial infarction: Secondary | ICD-10-CM | POA: Diagnosis not present

## 2017-01-06 DIAGNOSIS — I2510 Atherosclerotic heart disease of native coronary artery without angina pectoris: Secondary | ICD-10-CM | POA: Diagnosis not present

## 2017-01-06 DIAGNOSIS — N186 End stage renal disease: Secondary | ICD-10-CM | POA: Diagnosis not present

## 2017-01-08 DIAGNOSIS — H548 Legal blindness, as defined in USA: Secondary | ICD-10-CM | POA: Diagnosis not present

## 2017-01-08 DIAGNOSIS — N186 End stage renal disease: Secondary | ICD-10-CM | POA: Diagnosis not present

## 2017-01-08 DIAGNOSIS — I2510 Atherosclerotic heart disease of native coronary artery without angina pectoris: Secondary | ICD-10-CM | POA: Diagnosis not present

## 2017-01-08 DIAGNOSIS — I69398 Other sequelae of cerebral infarction: Secondary | ICD-10-CM | POA: Diagnosis not present

## 2017-01-08 DIAGNOSIS — I214 Non-ST elevation (NSTEMI) myocardial infarction: Secondary | ICD-10-CM | POA: Diagnosis not present

## 2017-01-08 DIAGNOSIS — I120 Hypertensive chronic kidney disease with stage 5 chronic kidney disease or end stage renal disease: Secondary | ICD-10-CM | POA: Diagnosis not present

## 2017-01-09 ENCOUNTER — Telehealth: Payer: Self-pay | Admitting: Cardiology

## 2017-01-09 DIAGNOSIS — I214 Non-ST elevation (NSTEMI) myocardial infarction: Secondary | ICD-10-CM | POA: Diagnosis not present

## 2017-01-09 DIAGNOSIS — H548 Legal blindness, as defined in USA: Secondary | ICD-10-CM | POA: Diagnosis not present

## 2017-01-09 DIAGNOSIS — I2510 Atherosclerotic heart disease of native coronary artery without angina pectoris: Secondary | ICD-10-CM | POA: Diagnosis not present

## 2017-01-09 DIAGNOSIS — N186 End stage renal disease: Secondary | ICD-10-CM | POA: Diagnosis not present

## 2017-01-09 DIAGNOSIS — I69398 Other sequelae of cerebral infarction: Secondary | ICD-10-CM | POA: Diagnosis not present

## 2017-01-09 DIAGNOSIS — I120 Hypertensive chronic kidney disease with stage 5 chronic kidney disease or end stage renal disease: Secondary | ICD-10-CM | POA: Diagnosis not present

## 2017-01-09 NOTE — Telephone Encounter (Signed)
Please call.

## 2017-01-09 NOTE — Telephone Encounter (Signed)
Called, phone goes to VM. Left msg for [facility] RN to call.

## 2017-01-09 NOTE — Telephone Encounter (Signed)
Verbal order for continuation of services given.

## 2017-01-10 DIAGNOSIS — H548 Legal blindness, as defined in USA: Secondary | ICD-10-CM | POA: Diagnosis not present

## 2017-01-10 DIAGNOSIS — Z720 Tobacco use: Secondary | ICD-10-CM | POA: Diagnosis not present

## 2017-01-10 DIAGNOSIS — G629 Polyneuropathy, unspecified: Secondary | ICD-10-CM | POA: Diagnosis not present

## 2017-01-10 DIAGNOSIS — E43 Unspecified severe protein-calorie malnutrition: Secondary | ICD-10-CM | POA: Diagnosis not present

## 2017-01-10 DIAGNOSIS — I2510 Atherosclerotic heart disease of native coronary artery without angina pectoris: Secondary | ICD-10-CM | POA: Diagnosis not present

## 2017-01-10 DIAGNOSIS — F419 Anxiety disorder, unspecified: Secondary | ICD-10-CM | POA: Diagnosis not present

## 2017-01-10 DIAGNOSIS — I5040 Unspecified combined systolic (congestive) and diastolic (congestive) heart failure: Secondary | ICD-10-CM | POA: Diagnosis not present

## 2017-01-10 DIAGNOSIS — I4891 Unspecified atrial fibrillation: Secondary | ICD-10-CM | POA: Diagnosis not present

## 2017-01-10 DIAGNOSIS — E785 Hyperlipidemia, unspecified: Secondary | ICD-10-CM | POA: Diagnosis not present

## 2017-01-10 DIAGNOSIS — K739 Chronic hepatitis, unspecified: Secondary | ICD-10-CM | POA: Diagnosis not present

## 2017-01-10 DIAGNOSIS — I252 Old myocardial infarction: Secondary | ICD-10-CM | POA: Diagnosis not present

## 2017-01-10 DIAGNOSIS — M150 Primary generalized (osteo)arthritis: Secondary | ICD-10-CM | POA: Diagnosis not present

## 2017-01-10 DIAGNOSIS — N186 End stage renal disease: Secondary | ICD-10-CM | POA: Diagnosis not present

## 2017-01-10 DIAGNOSIS — I132 Hypertensive heart and chronic kidney disease with heart failure and with stage 5 chronic kidney disease, or end stage renal disease: Secondary | ICD-10-CM | POA: Diagnosis not present

## 2017-01-10 DIAGNOSIS — Z992 Dependence on renal dialysis: Secondary | ICD-10-CM | POA: Diagnosis not present

## 2017-01-10 DIAGNOSIS — I69398 Other sequelae of cerebral infarction: Secondary | ICD-10-CM | POA: Diagnosis not present

## 2017-01-10 DIAGNOSIS — I429 Cardiomyopathy, unspecified: Secondary | ICD-10-CM | POA: Diagnosis not present

## 2017-01-12 NOTE — Telephone Encounter (Signed)
Returned call to [facility] with [facility] OT.Advised will send message to [facility] for order.

## 2017-01-12 NOTE — Telephone Encounter (Signed)
Ok  Trevor [name]  

## 2017-01-12 NOTE — Telephone Encounter (Signed)
Marsha from [facility] (OT) calling to get Verbal orders for 2 times a week for one week and one time a week for 3 weeks starting 01/11/17-01/31/17. Please call

## 2017-01-12 NOTE — Telephone Encounter (Signed)
Left message for [facility] with verbal order to continue OT as outlined.

## 2017-01-13 DIAGNOSIS — I5040 Unspecified combined systolic (congestive) and diastolic (congestive) heart failure: Secondary | ICD-10-CM | POA: Diagnosis not present

## 2017-01-13 DIAGNOSIS — H548 Legal blindness, as defined in USA: Secondary | ICD-10-CM | POA: Diagnosis not present

## 2017-01-13 DIAGNOSIS — I2510 Atherosclerotic heart disease of native coronary artery without angina pectoris: Secondary | ICD-10-CM | POA: Diagnosis not present

## 2017-01-13 DIAGNOSIS — I69398 Other sequelae of cerebral infarction: Secondary | ICD-10-CM | POA: Diagnosis not present

## 2017-01-13 DIAGNOSIS — I132 Hypertensive heart and chronic kidney disease with heart failure and with stage 5 chronic kidney disease, or end stage renal disease: Secondary | ICD-10-CM | POA: Diagnosis not present

## 2017-01-13 DIAGNOSIS — N186 End stage renal disease: Secondary | ICD-10-CM | POA: Diagnosis not present

## 2017-01-15 DIAGNOSIS — H548 Legal blindness, as defined in USA: Secondary | ICD-10-CM | POA: Diagnosis not present

## 2017-01-15 DIAGNOSIS — I5040 Unspecified combined systolic (congestive) and diastolic (congestive) heart failure: Secondary | ICD-10-CM | POA: Diagnosis not present

## 2017-01-15 DIAGNOSIS — N186 End stage renal disease: Secondary | ICD-10-CM | POA: Diagnosis not present

## 2017-01-15 DIAGNOSIS — I2510 Atherosclerotic heart disease of native coronary artery without angina pectoris: Secondary | ICD-10-CM | POA: Diagnosis not present

## 2017-01-15 DIAGNOSIS — I69398 Other sequelae of cerebral infarction: Secondary | ICD-10-CM | POA: Diagnosis not present

## 2017-01-15 DIAGNOSIS — I132 Hypertensive heart and chronic kidney disease with heart failure and with stage 5 chronic kidney disease, or end stage renal disease: Secondary | ICD-10-CM | POA: Diagnosis not present

## 2017-01-16 DIAGNOSIS — I132 Hypertensive heart and chronic kidney disease with heart failure and with stage 5 chronic kidney disease, or end stage renal disease: Secondary | ICD-10-CM | POA: Diagnosis not present

## 2017-01-16 DIAGNOSIS — I2510 Atherosclerotic heart disease of native coronary artery without angina pectoris: Secondary | ICD-10-CM | POA: Diagnosis not present

## 2017-01-16 DIAGNOSIS — H548 Legal blindness, as defined in USA: Secondary | ICD-10-CM | POA: Diagnosis not present

## 2017-01-16 DIAGNOSIS — I5040 Unspecified combined systolic (congestive) and diastolic (congestive) heart failure: Secondary | ICD-10-CM | POA: Diagnosis not present

## 2017-01-16 DIAGNOSIS — I69398 Other sequelae of cerebral infarction: Secondary | ICD-10-CM | POA: Diagnosis not present

## 2017-01-16 DIAGNOSIS — N186 End stage renal disease: Secondary | ICD-10-CM | POA: Diagnosis not present

## 2017-01-20 DIAGNOSIS — I69398 Other sequelae of cerebral infarction: Secondary | ICD-10-CM | POA: Diagnosis not present

## 2017-01-20 DIAGNOSIS — I5040 Unspecified combined systolic (congestive) and diastolic (congestive) heart failure: Secondary | ICD-10-CM | POA: Diagnosis not present

## 2017-01-20 DIAGNOSIS — I132 Hypertensive heart and chronic kidney disease with heart failure and with stage 5 chronic kidney disease, or end stage renal disease: Secondary | ICD-10-CM | POA: Diagnosis not present

## 2017-01-20 DIAGNOSIS — I2510 Atherosclerotic heart disease of native coronary artery without angina pectoris: Secondary | ICD-10-CM | POA: Diagnosis not present

## 2017-01-20 DIAGNOSIS — H548 Legal blindness, as defined in USA: Secondary | ICD-10-CM | POA: Diagnosis not present

## 2017-01-20 DIAGNOSIS — N186 End stage renal disease: Secondary | ICD-10-CM | POA: Diagnosis not present

## 2017-01-21 ENCOUNTER — Telehealth: Payer: Self-pay | Admitting: Cardiology

## 2017-01-21 NOTE — Telephone Encounter (Signed)
New Message    Pt has stopped taking all of his medication, pt stated that he was only skipping his meds on dialysis days, but after reviewing meds the pt has only taken meds 2 day in over 20 days.

## 2017-01-21 NOTE — Telephone Encounter (Signed)
Attempt to return call-no answer, lmtcb. 

## 2017-01-22 DIAGNOSIS — I5040 Unspecified combined systolic (congestive) and diastolic (congestive) heart failure: Secondary | ICD-10-CM | POA: Diagnosis not present

## 2017-01-22 DIAGNOSIS — I132 Hypertensive heart and chronic kidney disease with heart failure and with stage 5 chronic kidney disease, or end stage renal disease: Secondary | ICD-10-CM | POA: Diagnosis not present

## 2017-01-22 DIAGNOSIS — H548 Legal blindness, as defined in USA: Secondary | ICD-10-CM | POA: Diagnosis not present

## 2017-01-22 DIAGNOSIS — I2510 Atherosclerotic heart disease of native coronary artery without angina pectoris: Secondary | ICD-10-CM | POA: Diagnosis not present

## 2017-01-22 DIAGNOSIS — N186 End stage renal disease: Secondary | ICD-10-CM | POA: Diagnosis not present

## 2017-01-22 DIAGNOSIS — I69398 Other sequelae of cerebral infarction: Secondary | ICD-10-CM | POA: Diagnosis not present

## 2017-01-22 NOTE — Telephone Encounter (Signed)
Left message for Trevor [name] to call  

## 2017-01-23 NOTE — Telephone Encounter (Signed)
Left message for Trevor [name] to call. Spoke with pt, he reports he only holds his bp medications on dialysis days because his bp gets too low. He takes all the other medications daily.

## 2017-01-24 ENCOUNTER — Inpatient Hospital Stay (HOSPITAL_COMMUNITY)
Admission: EM | Admit: 2017-01-24 | Discharge: 2017-01-26 | DRG: 304 | Disposition: A | Discharge status: Home / Self Care | Payer: Medicare Other | Attending: Family Medicine | Admitting: Family Medicine

## 2017-01-24 ENCOUNTER — Encounter (HOSPITAL_COMMUNITY): Payer: Self-pay | Admitting: Emergency Medicine

## 2017-01-24 DIAGNOSIS — I132 Hypertensive heart and chronic kidney disease with heart failure and with stage 5 chronic kidney disease, or end stage renal disease: Secondary | ICD-10-CM | POA: Diagnosis present

## 2017-01-24 DIAGNOSIS — Z9119 Patient's noncompliance with other medical treatment and regimen: Secondary | ICD-10-CM

## 2017-01-24 DIAGNOSIS — E8889 Other specified metabolic disorders: Secondary | ICD-10-CM | POA: Diagnosis not present

## 2017-01-24 DIAGNOSIS — Z811 Family history of alcohol abuse and dependence: Secondary | ICD-10-CM

## 2017-01-24 DIAGNOSIS — R29898 Other symptoms and signs involving the musculoskeletal system: Secondary | ICD-10-CM

## 2017-01-24 DIAGNOSIS — B1920 Unspecified viral hepatitis C without hepatic coma: Secondary | ICD-10-CM | POA: Diagnosis present

## 2017-01-24 DIAGNOSIS — Z7982 Long term (current) use of aspirin: Secondary | ICD-10-CM

## 2017-01-24 DIAGNOSIS — Z8249 Family history of ischemic heart disease and other diseases of the circulatory system: Secondary | ICD-10-CM

## 2017-01-24 DIAGNOSIS — I429 Cardiomyopathy, unspecified: Secondary | ICD-10-CM | POA: Diagnosis present

## 2017-01-24 DIAGNOSIS — Z8673 Personal history of transient ischemic attack (TIA), and cerebral infarction without residual deficits: Secondary | ICD-10-CM

## 2017-01-24 DIAGNOSIS — I10 Essential (primary) hypertension: Secondary | ICD-10-CM | POA: Diagnosis present

## 2017-01-24 DIAGNOSIS — Z992 Dependence on renal dialysis: Secondary | ICD-10-CM

## 2017-01-24 DIAGNOSIS — R079 Chest pain, unspecified: Secondary | ICD-10-CM | POA: Diagnosis present

## 2017-01-24 DIAGNOSIS — G629 Polyneuropathy, unspecified: Secondary | ICD-10-CM | POA: Diagnosis not present

## 2017-01-24 DIAGNOSIS — I5042 Chronic combined systolic (congestive) and diastolic (congestive) heart failure: Secondary | ICD-10-CM | POA: Diagnosis present

## 2017-01-24 DIAGNOSIS — H547 Unspecified visual loss: Secondary | ICD-10-CM | POA: Diagnosis present

## 2017-01-24 DIAGNOSIS — I2510 Atherosclerotic heart disease of native coronary artery without angina pectoris: Secondary | ICD-10-CM | POA: Diagnosis present

## 2017-01-24 DIAGNOSIS — I160 Hypertensive urgency: Secondary | ICD-10-CM | POA: Diagnosis not present

## 2017-01-24 DIAGNOSIS — N186 End stage renal disease: Secondary | ICD-10-CM

## 2017-01-24 DIAGNOSIS — R0789 Other chest pain: Secondary | ICD-10-CM | POA: Diagnosis not present

## 2017-01-24 DIAGNOSIS — Z79899 Other long term (current) drug therapy: Secondary | ICD-10-CM

## 2017-01-24 DIAGNOSIS — I480 Paroxysmal atrial fibrillation: Secondary | ICD-10-CM | POA: Diagnosis present

## 2017-01-24 DIAGNOSIS — Z833 Family history of diabetes mellitus: Secondary | ICD-10-CM

## 2017-01-24 DIAGNOSIS — E785 Hyperlipidemia, unspecified: Secondary | ICD-10-CM | POA: Diagnosis present

## 2017-01-24 DIAGNOSIS — I252 Old myocardial infarction: Secondary | ICD-10-CM

## 2017-01-24 DIAGNOSIS — I5043 Acute on chronic combined systolic (congestive) and diastolic (congestive) heart failure: Secondary | ICD-10-CM | POA: Diagnosis present

## 2017-01-24 DIAGNOSIS — R531 Weakness: Secondary | ICD-10-CM

## 2017-01-24 DIAGNOSIS — F17210 Nicotine dependence, cigarettes, uncomplicated: Secondary | ICD-10-CM | POA: Diagnosis present

## 2017-01-24 LAB — COMPREHENSIVE METABOLIC PANEL
ALT: 8 U/L — ABNORMAL LOW (ref 17–63)
ANION GAP: 15 (ref 5–15)
AST: 15 U/L (ref 15–41)
Albumin: 3.4 g/dL — ABNORMAL LOW (ref 3.5–5.0)
Alkaline Phosphatase: 78 U/L (ref 38–126)
BILIRUBIN TOTAL: 0.5 mg/dL (ref 0.3–1.2)
BUN: 19 mg/dL (ref 6–20)
CHLORIDE: 98 mmol/L — AB (ref 101–111)
CO2: 27 mmol/L (ref 22–32)
Calcium: 9.9 mg/dL (ref 8.9–10.3)
Creatinine, Ser: 6.79 mg/dL — ABNORMAL HIGH (ref 0.61–1.24)
GFR, EST AFRICAN AMERICAN: 8 mL/min — AB (ref >60)
GFR, EST NON AFRICAN AMERICAN: 7 mL/min — AB (ref >60)
Glucose, Bld: 95 mg/dL (ref 65–99)
POTASSIUM: 4 mmol/L (ref 3.5–5.1)
Sodium: 140 mmol/L (ref 135–145)
TOTAL PROTEIN: 7 g/dL (ref 6.5–8.1)

## 2017-01-24 LAB — LIPID PANEL
CHOL/HDL RATIO: 3.6 ratio
Cholesterol: 135 mg/dL (ref 0–200)
HDL: 37 mg/dL — AB (ref >40)
LDL Cholesterol: 74 mg/dL (ref 0–99)
Triglycerides: 119 mg/dL (ref <150)
VLDL: 24 mg/dL (ref 0–40)

## 2017-01-24 LAB — PROTIME-INR
INR: 1.06
PROTHROMBIN TIME: 13.8 s (ref 11.4–15.2)

## 2017-01-24 LAB — DIFFERENTIAL
Basophils Absolute: 0.1 10*3/uL (ref 0.0–0.1)
Basophils Relative: 1 %
EOS ABS: 0.3 10*3/uL (ref 0.0–0.7)
Eosinophils Relative: 7 %
LYMPHS ABS: 2.1 10*3/uL (ref 0.7–4.0)
Lymphocytes Relative: 43 %
Monocytes Absolute: 0.6 10*3/uL (ref 0.1–1.0)
Monocytes Relative: 12 %
NEUTROS PCT: 37 %
Neutro Abs: 1.8 10*3/uL (ref 1.7–7.7)

## 2017-01-24 LAB — CBC
HCT: 36.3 % — ABNORMAL LOW (ref 39.0–52.0)
HEMOGLOBIN: 12 g/dL — AB (ref 13.0–17.0)
MCH: 29.7 pg (ref 26.0–34.0)
MCHC: 33.1 g/dL (ref 30.0–36.0)
MCV: 89.9 fL (ref 78.0–100.0)
Platelets: 168 10*3/uL (ref 150–400)
RBC: 4.04 MIL/uL — AB (ref 4.22–5.81)
RDW: 13.5 % (ref 11.5–15.5)
WBC: 4.8 10*3/uL (ref 4.0–10.5)

## 2017-01-24 LAB — APTT: aPTT: 37 seconds — ABNORMAL HIGH (ref 24–36)

## 2017-01-24 LAB — TROPONIN I: TROPONIN I: 0.03 ng/mL — AB (ref <0.03)

## 2017-01-24 NOTE — Plan of Care (Signed)
STEMI alert called for this patient based on EMS ECG which demonstrated ST elevation in aVR with ST depression from V3-V6 and in inferior leads.  BP at the time of that ECG was 242/100 at that time and pt c/o "3/10" chest pain.  On arrival to the ED he is chest pain free and BP now 140/74 after receiving medication in route.  Repeat ECG shows resolution of prior ST changes and now only significant for LVH by voltage criteria with repolarization changes similar to his baseline ECG.  His presentation is most consistent with hypertensive urgency and is not concerning for STEMI, and so the STEMI code was cancelled.  Clayborne Dana MD

## 2017-01-24 NOTE — H&P (Signed)
[facility] Admission History and Physical Service Pager: [phone]  Patient name: Trevor [name] Medical record number: [id number] Date of birth: 11/19/1946 Age: 71 y.o. Gender: male  Primary Care Provider: [facility] Consultants: None Code Status: Full  Chief Complaint: Chest pain  Assessment and Plan: JULLIEN GRANQUIST is a 71 y.o. male presenting with elevated blood pressure. PMH is significant for ESRD on dialysis (M/W/F), HTN, HLD, non-occlusive CAD (12/17 Cath 60% stenosis to CFX and 60 Dx1), MI, CHF (EF 45-50%, mod LVH, G1DD in 12/2015), Paroxysmal Afib, AAA, CVA   Hypertensive urgency, improved - Recurrent, likely associated with poor medication adherence.  Patient reportedly holds BP meds on dialysis days per chart review. He has multiple past admissions for hypertensive urgency. Associated chest pain and EKG changes (see separate problem). BP reportedly elevated to 240/100 per EMS, patient received nitroglycerin x2 and ASA in the field and SBP improved to 139 upon arrival to hospital. Patient currently asymptomatic and without lab findings of end-organ damage. Earlier symptomatic HTN is concerning for hypertensive emergency, but w/o many symptoms at this time this is more c/w urgency. - Admit to FPTS attending Dr. Erin Hearing - monitor on telemetry - trend troponin >> ESRD makes this less reliable.  - permissive HTN to 180 SBP - hold home nifedipine, isosorbide-hydralazine TID for permissive HTN - PRN hydralazine 5 mg for SBP>220, DBP>110 - Risk strat labs: HbA1c, lipid panel, TSH - repeat AM EKG - consider cardiology consult in AM - Avoid QTc prolonging medications  Chest pain and EKG changes, resolved - Likely due to demand in setting of hypertensive urgency. ECG by EMS showed ST elevation in VR with ST depression in V3-V6 and inferior leads.  Repeat ECG in ED showed resolution of prior ST changes with LVH and repol similar to baseline ECG,  QTc prolongation.  CP resolved prior to admission. Code STEMI and cath lab cancelled by cardiology upon patient's arrival to ED.   - monitor on tele, trend troponin, repeat AM EKG as above  ESRD on HD MWF, stable - Patient went for HD on Friday. Follows with Dr. Marval Regal at [place]. HD since 2014. - continue home phoslo - can call nephrology in AM for HD if patient is staying until monday  CAD/HLD, stable - Lipid panel on admission Chol 135, Triglycerides 119, HDL 37, LDL 74.  - continue home lipitor 40 mg Qd, ASA 81 mg Qd, plavix 74 mg Qd - hold home nifedipine for permissive HTN  Afib, paroxysmal - currently in NSR per ECG from admission - continue home metoprolol tartrate 25 mg BID - continue home ASA, Plavix - telemetry as above  CHF, stable  - low suspicion for exacerbation, patient appears euvolemic. 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 - stabilize BP  FEN/GI: SLIV, renal/carb mod diet Prophylaxis: SCDs  Disposition: Admit to FPTS  History of Present Illness:  Trevor [name] is a 71 y.o. male presenting with hypertensive urgency.  The patient was in his usual state of health until 6 PM the day of admission when he developed dizziness and sharp substernal, non-radiating chest pain with associated diaphoresis. No nausea. Chest pain was worse with exertion and better with rest. He recognized these  symptoms as indicating elevated blood pressure.  EMS was called, and patient was noted to have EKG changes concerning for STEMI in the field. He was given aspirin and nitroglycerin 2 and chest pain resolved, blood pressure improved. Cardiology was at bedside upon patient's arrival to ED per ED note, and after reviewing a repeat EKG, code STEMI  was discontinued.  The patient notes he only sees shapes with his left eye that is post stroke. He denies nausea, vomiting, diarrhea, no recent fevers, no changes in urination. He does endorse a 25-pack-year smoking history. Denies alcohol use. Denies PND/orthopnea/lower extremity swelling.  Upon admission to the hospital the patient versus being comfortable, chest pain has resolved. He notes that he goes to hemodialysis on M/W/F and has not missed HD this week.  Review Of Systems: Per HPI.  ROS  Patient Active Problem List   Diagnosis Date Noted  . 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 Ischemic chest pain ([place]) 11/01/2016  . Hypoxemia 09/07/2016  . Essential hypertension 03/08/2016  . Orthostatic hypotension 01/12/2016  . Dyslipidemia 01/12/2016  . Cough   . Weakness generalized 12/26/2015  . Chest pain 08/12/2015  . Malignant hypertension 08/12/2015  . Carotid stenosis   . Stroke ([place]) 04/03/2015  . Abdominal aortic aneurysm ([place]) 03/06/2015  . Generalized weakness 09/08/2014  . Acute encephalopathy 09/07/2014  . Hypertensive emergency 07/05/2014  . Protein-calorie malnutrition, severe ([place]) 05/20/2014  . Hypokalemia 05/19/2014  . Syncope and collapse 05/19/2014  . ESRD on hemodialysis ([place]) 05/19/2014  . Anemia of renal disease 05/19/2014  . Coronary artery disease, non-occlusive:  09/28/2012  . Anemia 09/27/2012  . Secondary hyperparathyroidism ([place]) 09/27/2012  . Non compliance with medical treatment 09/27/2012  . Chronic combined systolic and diastolic CHF (congestive heart failure) ([place]) 10/10/2011  . A-fib ([place]) 10/08/2011  . History of stroke 10/07/2011  . Cocaine abuse 10/07/2011  . Severe sinus bradycardia 10/07/2011  . Nonischemic cardiomyopathy ([employer]) 09/18/2011  . Tobacco use disorder 08/07/2011  . Bruit 08/07/2011    Past Medical History: Past Medical History:  Diagnosis Date  . AAA (abdominal aortic aneurysm) ([place])   . Anxiety   . Arthritis    . Blindness   . CHF (congestive heart failure) ([place])   . 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 ESRD on hemodialysis ([place])    started HD 2014-15  . Headache(784.0)   . HTN (hypertension)   . Protein-calorie malnutrition ([place])   . Stroke [facility])     Past Surgical History: Past Surgical History:  Procedure Laterality Date  . CARDIAC CATHETERIZATION N/A 11/01/2016   Procedure: Left Heart Cath and Coronary Angiography;  Surgeon: Lorretta Harp, MD;  Location: [facility] CV LAB;  Service: Cardiovascular;  Laterality: N/A;  . INSERTION OF DIALYSIS CATHETER  09/21/2012   Procedure: INSERTION OF DIALYSIS CATHETER;  Surgeon: Angelia Mould, MD;  Location: [facility];  Service: Vascular;  Laterality: N/A;  Right Internal Jugular Placement  . LEFT HEART CATHETERIZATION WITH CORONARY ANGIOGRAM N/A 09/28/2012   Procedure: LEFT HEART CATHETERIZATION WITH CORONARY ANGIOGRAM;  Surgeon: Sherren Mocha, MD;  Location: [facility] CATH LAB;  Service: Cardiovascular;  Laterality: N/A;  . LEFT HEART CATHETERIZATION WITH CORONARY ANGIOGRAM N/A 07/07/2014   Procedure: LEFT HEART CATHETERIZATION WITH CORONARY ANGIOGRAM;  Surgeon: Leonie Man, MD;  Location: [facility] CATH LAB;  Service: Cardiovascular;  Laterality: N/A;  . right hand    . TEE WITHOUT CARDIOVERSION  10/09/2011  Procedure: TRANSESOPHAGEAL ECHOCARDIOGRAM (TEE);  Surgeon: Lelon Perla, MD;  Location: [facility] ENDOSCOPY;  Service: Cardiovascular;  Laterality: N/A;    Social History: Social History  Substance Use Topics  . Smoking status: Current Some Day Smoker    Packs/day: 0.50    Years: 50.00    Types: Cigarettes  . Smokeless tobacco: Never Used  . Alcohol use No     Comment: Occasional    Family History: Family History  Problem Relation Age of Onset  . Heart attack Mother     MI in her 28s  . Diabetes Mother   . Alcohol abuse Father   . Anesthesia problems Neg Hx   . Hypotension Neg Hx   .  Malignant hyperthermia Neg Hx   . Pseudochol deficiency Neg Hx     Allergies and Medications: No Known Allergies No current facility-administered medications on file prior to encounter.    Current Outpatient Prescriptions on File Prior to Encounter  Medication Sig Dispense Refill  . acetaminophen (TYLENOL) 325 MG tablet Take 2 tablets (650 mg total) by mouth every 4 (four) hours as needed for headache or mild pain.    Marland Kitchen aspirin 81 MG chewable tablet Chew 1 tablet (81 mg total) by mouth daily.    Marland Kitchen atorvastatin (LIPITOR) 40 MG tablet Take 40 mg by mouth at bedtime.    . calcium acetate (PHOSLO) 667 MG capsule Take 1,334 mg by mouth 3 (three) times daily before meals.     . clopidogrel (PLAVIX) 75 MG tablet Take 1 tablet (75 mg total) by mouth daily. 30 tablet 2  . docusate sodium (COLACE) 100 MG capsule Take 200 mg by mouth daily.     . Elbasvir-Grazoprevir (ZEPATIER) 50-100 MG TABS Take 1 tablet by mouth daily.    Marland Kitchen gabapentin (NEURONTIN) 100 MG capsule Take 1 capsule (100 mg total) by mouth 3 (three) times daily. 90 capsule 2  . isosorbide-hydrALAZINE (BIDIL) 20-37.5 MG tablet Take 1 tablet by mouth 3 (three) times daily. 90 tablet 5  . metoprolol tartrate (LOPRESSOR) 25 MG tablet Take 1 tablet (25 mg total) by mouth 2 (two) times daily. 60 tablet 5  . NIFEdipine (PROCARDIA-XL/ADALAT CC) 60 MG 24 hr tablet Take 1 tablet (60 mg total) by mouth daily.    . Nutritional Supplements (FEEDING SUPPLEMENT, NEPRO CARB STEADY,) LIQD Take 237 mLs by mouth 2 (two) times daily between meals.  0  . Omega-3 Fatty Acids (FISH OIL) 1000 MG CAPS Take 1,000 mg by mouth 2 (two) times daily.      Objective: BP 139/74 (BP Location: Right Arm)   Pulse 82   Temp 98 F (36.7 C) (Oral)   Resp 19   SpO2 98%  Exam: General: NAD, rests comfortably in bed, pleasant and cooperative Eyes: EOMI, no scleral icterus, no conjunctival pallor or injection ENTM: Membranes moist, no pharyngeal erythema or exudate, no  rhinorrhea or congestion Neck: Supple, no thyromegaly, no cervical lymphadenopathy Cardiovascular: RRR, no M/R/G, no LE edema, peripheral pulses palpable Respiratory: CTA Bil, no W/R/R Gastrointestinal: Soft and nontender, nondistended MSK: Moves 4 extremities equally, no deformity Derm: No rashes or lesions Neuro: CN III through XII grossly intact, patient does report decreased eyesight (chronic s/p CVA) Psych: AAO 3, thought process linear, affect appropriate  Labs and Imaging: CBC BMET   Recent Labs Lab 01/24/17 1935  WBC 4.8  HGB 12.0*  HCT 36.3*  PLT 168    Recent Labs Lab 01/24/17 1935  NA 140  K 4.0  CL 98*  CO2 27  BUN 19  CREATININE 6.79*  GLUCOSE 95  CALCIUM 9.9      Lipid panel on admission Chol 135, Triglycerides 119, HDL 37, LDL 74 ECG by EMS showed ST elevation in VR with ST depression in V3-V6 and inferior leads.   Repeat ECG in ED showed resolution of prior ST changes with LVH and repol similar to baseline ECG, QTc prolongation.   Everrett Coombe, MD 01/24/2017, 9:47 PM PGY-1, [place] Intern pager: [phone], text pages welcome  Upper Level Addendum:  I have seen and evaluated this patient along with Dr. Burr Medico and reviewed the above note, making necessary revisions in red.   Elberta Leatherwood, MD,MS,  PGY3 01/25/2017 2:21 AM

## 2017-01-24 NOTE — ED Triage Notes (Signed)
Pt arrives via EMS for code stemi, pain free at this time. Sharp chest pain onset 30 minutes prior to arrival to ED. Received 2SL nitro PTA which resolved pain.  Hx dialysis, T TH S schedule.

## 2017-01-24 NOTE — ED Provider Notes (Signed)
[facility] DEPT Provider Note   CSN: [id number] Arrival date & time: 01/24/17  1928     History   Chief Complaint Chief Complaint  Patient presents with  . Code STEMI    HPI Trevor [name] is a 71 y.o. male.  The history is provided by the patient.  Chest Pain   This is a recurrent problem. The current episode started less than 1 hour ago. The problem occurs constantly. The problem has been resolved. The pain is associated with rest. The pain is present in the substernal region. The pain is at a severity of 10/10. The pain is severe. The quality of the pain is described as sharp. The pain does not radiate. Duration of episode(s) is 30 minutes. Associated symptoms include shortness of breath. Pertinent negatives include no abdominal pain, no back pain, no cough, no fever, no palpitations and no vomiting. He has tried nitroglycerin for the symptoms. The treatment provided significant relief. Risk factors include male gender.  His past medical history is significant for CAD, hypertension and strokes.  Pertinent negatives for past medical history include no seizures.  Procedure history is positive for cardiac catheterization and echocardiogram.    Past Medical History:  Diagnosis Date  . AAA (abdominal aortic aneurysm) ([place])   . Anxiety   . Arthritis   . Blindness   . CHF (congestive heart failure) ([place])   . 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 ESRD on hemodialysis ([place])    started HD 2014-15  . Headache(784.0)   . HTN (hypertension)   . Protein-calorie malnutrition ([place])   . Stroke [facility])     Patient Active Problem List   Diagnosis Date Noted  . 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 Ischemic chest pain ([place]) 11/01/2016  . Hypoxemia 09/07/2016  . Essential hypertension 03/08/2016  . Orthostatic hypotension 01/12/2016  . Dyslipidemia 01/12/2016  . Cough   . Weakness generalized 12/26/2015  . Chest pain  08/12/2015  . Malignant hypertension 08/12/2015  . Carotid stenosis   . Stroke ([place]) 04/03/2015  . Abdominal aortic aneurysm ([place]) 03/06/2015  . Generalized weakness 09/08/2014  . Acute encephalopathy 09/07/2014  . Hypertensive emergency 07/05/2014  . Protein-calorie malnutrition, severe ([place]) 05/20/2014  . Hypokalemia 05/19/2014  . Syncope and collapse 05/19/2014  . ESRD on hemodialysis ([place]) 05/19/2014  . Anemia of renal disease 05/19/2014  . Coronary artery disease, non-occlusive:  09/28/2012  . Anemia 09/27/2012  . Secondary hyperparathyroidism ([place]) 09/27/2012  . Non compliance with medical treatment 09/27/2012  . Chronic combined systolic and diastolic CHF (congestive heart failure) ([place]) 10/10/2011  . A-fib ([place]) 10/08/2011  . History of stroke 10/07/2011  . Cocaine abuse 10/07/2011  . Severe sinus bradycardia 10/07/2011  . Nonischemic cardiomyopathy ([place]) 09/18/2011  . Tobacco use disorder 08/07/2011  . Bruit 08/07/2011    Past Surgical History:  Procedure Laterality Date  . CARDIAC CATHETERIZATION N/A 11/01/2016   Procedure: Left Heart Cath and Coronary Angiography;  Surgeon: Lorretta Harp, MD;  Location: [facility] CV LAB;  Service: Cardiovascular;  Laterality: N/A;  . INSERTION OF DIALYSIS CATHETER  09/21/2012   Procedure: INSERTION OF DIALYSIS CATHETER;  Surgeon: Angelia Mould, MD;  Location: [facility];  Service: Vascular;  Laterality: N/A;  Right Internal Jugular Placement  . LEFT HEART CATHETERIZATION WITH CORONARY ANGIOGRAM N/A 09/28/2012   Procedure: LEFT HEART CATHETERIZATION WITH CORONARY ANGIOGRAM;  Surgeon: Sherren Mocha, MD;  Location: [facility] CATH LAB;  Service: Cardiovascular;  Laterality: N/A;  . LEFT HEART CATHETERIZATION WITH CORONARY ANGIOGRAM N/A 07/07/2014   Procedure: LEFT HEART CATHETERIZATION WITH CORONARY ANGIOGRAM;  Surgeon: Leonie Man, MD;  Location: [facility] CATH LAB;  Service: Cardiovascular;  Laterality: N/A;  . right hand    . TEE WITHOUT  CARDIOVERSION  10/09/2011   Procedure: TRANSESOPHAGEAL ECHOCARDIOGRAM (TEE);  Surgeon: Lelon Perla, MD;  Location: [facility] ENDOSCOPY;  Service: Cardiovascular;  Laterality: N/A;       Home Medications    Prior to Admission medications   Medication Sig Start Date End Date Taking? Authorizing Provider  acetaminophen (TYLENOL) 325 MG tablet Take 2 tablets (650 mg total) by mouth every 4 (four) hours as needed for headache or mild pain. 12/11/16  Yes Geradine Girt, DO  aspirin 81 MG chewable tablet Chew 1 tablet (81 mg total) by mouth daily. 11/04/16  Yes Brittainy Erie Noe, PA-C  atorvastatin (LIPITOR) 40 MG tablet Take 40 mg by mouth at bedtime.   Yes Historical Provider, MD  calcium acetate (PHOSLO) 667 MG capsule Take 1,334 mg by mouth 3 (three) times daily before meals.    Yes Historical Provider, MD  clopidogrel (PLAVIX) 75 MG tablet Take 1 tablet (75 mg total) by mouth daily. 04/05/15  Yes Barton Dubois, MD  docusate sodium (COLACE) 100 MG capsule Take 200 mg by mouth daily.    Yes Historical Provider, MD  Elbasvir-Grazoprevir (ZEPATIER) 50-100 MG TABS Take 1 tablet by mouth daily.   Yes Historical Provider, MD  gabapentin (NEURONTIN) 100 MG capsule Take 1 capsule (100 mg total) by mouth 3 (three) times daily. 04/05/15  Yes Barton Dubois, MD  isosorbide-hydrALAZINE (BIDIL) 20-37.5 MG tablet Take 1 tablet by mouth 3 (three) times daily. 11/03/16  Yes Brittainy Erie Noe, PA-C  metoprolol tartrate (LOPRESSOR) 25 MG tablet Take 1 tablet (25 mg total) by mouth 2 (two) times daily. 11/03/16  Yes Brittainy Erie Noe, PA-C  NIFEdipine (PROCARDIA-XL/ADALAT CC) 60 MG 24 hr tablet Take 1 tablet (60 mg total) by mouth daily. 12/12/16  Yes Geradine Girt, DO  Nutritional Supplements (FEEDING SUPPLEMENT, NEPRO CARB STEADY,) LIQD Take 237 mLs by mouth 2 (two) times daily between meals. 12/11/16  Yes Geradine Girt, DO  Omega-3 Fatty Acids (FISH OIL) 1000 MG CAPS Take 1,000 mg by mouth 2 (two) times daily.   Yes  Historical Provider, MD    Family History Family History  Problem Relation Age of Onset  . Heart attack Mother     MI in her 12s  . Diabetes Mother   . Alcohol abuse Father   . Anesthesia problems Neg Hx   . Hypotension Neg Hx   . Malignant hyperthermia Neg Hx   . Pseudochol deficiency Neg Hx     Social History Social History  Substance Use Topics  . Smoking status: Current Some Day Smoker    Packs/day: 0.50    Years: 50.00    Types: Cigarettes  . Smokeless tobacco: Never Used  . Alcohol use No     Comment: Occasional     Allergies   Patient has no known allergies.   Review of Systems Review of Systems  Constitutional: Negative for chills and fever.  HENT: Negative for ear pain and sore throat.   Eyes: Negative for pain and visual disturbance.  Respiratory: Positive for shortness of breath. Negative for cough.   Cardiovascular: Positive for chest pain. Negative for palpitations.  Gastrointestinal: Negative for abdominal pain and vomiting.  Genitourinary: Negative  for dysuria and hematuria.  Musculoskeletal: Negative for arthralgias and back pain.  Skin: Negative for color change and rash.  Neurological: Negative for seizures and syncope.  All other systems reviewed and are negative.    Physical Exam Updated Vital Signs BP 139/74 (BP Location: Right Arm)   Pulse 82   Temp 98 F (36.7 C) (Oral)   Resp 19   SpO2 98%   Physical Exam  Constitutional: He is oriented to person, place, and time. He appears well-developed and well-nourished.  HENT:  Head: Normocephalic and atraumatic.  Eyes: Conjunctivae are normal.  Neck: Neck supple.  Cardiovascular: Normal rate, regular rhythm and intact distal pulses.   No murmur heard. AV fistula in left upper extremity with palpable thrill  Pulmonary/Chest: Effort normal and breath sounds normal. No respiratory distress. He has no wheezes.  Abdominal: Soft. There is no tenderness.  Musculoskeletal: He exhibits no  edema, tenderness or deformity.  Neurological: He is alert and oriented to person, place, and time.  Skin: Skin is warm and dry.  Psychiatric: He has a normal mood and affect.  Nursing note and vitals reviewed.    ED Treatments / Results  Labs (all labs ordered are listed, but only abnormal results are displayed) Labs Reviewed  CBC - Abnormal; Notable for the following:       Result Value   RBC 4.04 (*)    Hemoglobin 12.0 (*)    HCT 36.3 (*)    All other components within normal limits  APTT - Abnormal; Notable for the following:    aPTT 37 (*)    All other components within normal limits  COMPREHENSIVE METABOLIC PANEL - Abnormal; Notable for the following:    Chloride 98 (*)    Creatinine, Ser 6.79 (*)    Albumin 3.4 (*)    ALT 8 (*)    GFR calc non Af Amer 7 (*)    GFR calc Af Amer 8 (*)    All other components within normal limits  TROPONIN I - Abnormal; Notable for the following:    Troponin I 0.03 (*)    All other components within normal limits  LIPID PANEL - Abnormal; Notable for the following:    HDL 37 (*)    All other components within normal limits  DIFFERENTIAL  PROTIME-INR    EKG  EKG Interpretation None       Radiology No results found.  Procedures Procedures (including critical care time)  Medications Ordered in ED Medications - No data to display   Initial Impression / Assessment and Plan / ED Course  I have reviewed the triage vital signs and the nursing notes.  Pertinent labs & imaging results that were available during my care of the patient were reviewed by me and considered in my medical decision making (see chart for details).    Patient is a 71 year old male with history as above who presents with sudden onset chest pain 30 minutes prior to arrival. EMS sent in an EKG which showed elevation in lead aVR with diffuse ST segment depression. Code STEMI was called in the field. Upon arrival, EMS reports the patient initially had a blood  pressure of 242/100. He was given 2 nitroglycerin and 324 mg of aspirin. His pain has almost resolved, now reporting 2 out of 10. His EKG also shows improvement in the ST depression and elevation. Cardiology at bedside upon patient's arrival. After reviewing the EKG here, the decision was made to cancel the cath lab. Patient  is breathing comfortably now. He was admitted here for similar symptoms in January. His chest pain was thought to be related to hypertensive urgency at that time. Initial blood pressure here was 340 systolic. He had a cath in December 2017 which did show obstructive disease, but thought to be noncritical. Plan is to admit to hospitalist for further workup and rule out ACS. Initial troponin here 0.03.  Final Clinical Impressions(s) / ED Diagnoses   Final diagnoses:  Chest pain, unspecified type    New Prescriptions New Prescriptions   No medications on file     Clifton James, MD 01/24/17 2318    Elnora Morrison, MD 01/26/17 [phone]

## 2017-01-25 DIAGNOSIS — B1920 Unspecified viral hepatitis C without hepatic coma: Secondary | ICD-10-CM | POA: Diagnosis present

## 2017-01-25 DIAGNOSIS — R29898 Other symptoms and signs involving the musculoskeletal system: Secondary | ICD-10-CM

## 2017-01-25 DIAGNOSIS — I120 Hypertensive chronic kidney disease with stage 5 chronic kidney disease or end stage renal disease: Secondary | ICD-10-CM | POA: Diagnosis not present

## 2017-01-25 DIAGNOSIS — R079 Chest pain, unspecified: Secondary | ICD-10-CM

## 2017-01-25 DIAGNOSIS — I5042 Chronic combined systolic (congestive) and diastolic (congestive) heart failure: Secondary | ICD-10-CM | POA: Diagnosis not present

## 2017-01-25 DIAGNOSIS — I252 Old myocardial infarction: Secondary | ICD-10-CM | POA: Diagnosis not present

## 2017-01-25 DIAGNOSIS — F17210 Nicotine dependence, cigarettes, uncomplicated: Secondary | ICD-10-CM | POA: Diagnosis present

## 2017-01-25 DIAGNOSIS — G629 Polyneuropathy, unspecified: Secondary | ICD-10-CM | POA: Diagnosis present

## 2017-01-25 DIAGNOSIS — Z9119 Patient's noncompliance with other medical treatment and regimen: Secondary | ICD-10-CM | POA: Diagnosis not present

## 2017-01-25 DIAGNOSIS — I160 Hypertensive urgency: Secondary | ICD-10-CM | POA: Diagnosis not present

## 2017-01-25 DIAGNOSIS — Z811 Family history of alcohol abuse and dependence: Secondary | ICD-10-CM | POA: Diagnosis not present

## 2017-01-25 DIAGNOSIS — Z8249 Family history of ischemic heart disease and other diseases of the circulatory system: Secondary | ICD-10-CM | POA: Diagnosis not present

## 2017-01-25 DIAGNOSIS — N186 End stage renal disease: Secondary | ICD-10-CM | POA: Diagnosis not present

## 2017-01-25 DIAGNOSIS — H547 Unspecified visual loss: Secondary | ICD-10-CM | POA: Diagnosis present

## 2017-01-25 DIAGNOSIS — I2510 Atherosclerotic heart disease of native coronary artery without angina pectoris: Secondary | ICD-10-CM | POA: Diagnosis present

## 2017-01-25 DIAGNOSIS — Z8673 Personal history of transient ischemic attack (TIA), and cerebral infarction without residual deficits: Secondary | ICD-10-CM | POA: Diagnosis not present

## 2017-01-25 DIAGNOSIS — I480 Paroxysmal atrial fibrillation: Secondary | ICD-10-CM | POA: Diagnosis present

## 2017-01-25 DIAGNOSIS — N2581 Secondary hyperparathyroidism of renal origin: Secondary | ICD-10-CM | POA: Diagnosis not present

## 2017-01-25 DIAGNOSIS — Z7982 Long term (current) use of aspirin: Secondary | ICD-10-CM | POA: Diagnosis not present

## 2017-01-25 DIAGNOSIS — Z79899 Other long term (current) drug therapy: Secondary | ICD-10-CM | POA: Diagnosis not present

## 2017-01-25 DIAGNOSIS — Z992 Dependence on renal dialysis: Secondary | ICD-10-CM | POA: Diagnosis not present

## 2017-01-25 DIAGNOSIS — I429 Cardiomyopathy, unspecified: Secondary | ICD-10-CM | POA: Diagnosis present

## 2017-01-25 DIAGNOSIS — Z833 Family history of diabetes mellitus: Secondary | ICD-10-CM | POA: Diagnosis not present

## 2017-01-25 DIAGNOSIS — E785 Hyperlipidemia, unspecified: Secondary | ICD-10-CM | POA: Diagnosis present

## 2017-01-25 DIAGNOSIS — D631 Anemia in chronic kidney disease: Secondary | ICD-10-CM | POA: Diagnosis not present

## 2017-01-25 DIAGNOSIS — E8889 Other specified metabolic disorders: Secondary | ICD-10-CM | POA: Diagnosis present

## 2017-01-25 DIAGNOSIS — I132 Hypertensive heart and chronic kidney disease with heart failure and with stage 5 chronic kidney disease, or end stage renal disease: Secondary | ICD-10-CM | POA: Diagnosis present

## 2017-01-25 LAB — CBC
HEMATOCRIT: 33.4 % — AB (ref 39.0–52.0)
Hemoglobin: 11 g/dL — ABNORMAL LOW (ref 13.0–17.0)
MCH: 29.6 pg (ref 26.0–34.0)
MCHC: 32.9 g/dL (ref 30.0–36.0)
MCV: 89.8 fL (ref 78.0–100.0)
PLATELETS: 152 10*3/uL (ref 150–400)
RBC: 3.72 MIL/uL — ABNORMAL LOW (ref 4.22–5.81)
RDW: 13.9 % (ref 11.5–15.5)
WBC: 4.1 10*3/uL (ref 4.0–10.5)

## 2017-01-25 LAB — BASIC METABOLIC PANEL
Anion gap: 10 (ref 5–15)
BUN: 22 mg/dL — AB (ref 6–20)
CO2: 29 mmol/L (ref 22–32)
CREATININE: 7.76 mg/dL — AB (ref 0.61–1.24)
Calcium: 9.6 mg/dL (ref 8.9–10.3)
Chloride: 103 mmol/L (ref 101–111)
GFR calc Af Amer: 7 mL/min — ABNORMAL LOW (ref >60)
GFR calc non Af Amer: 6 mL/min — ABNORMAL LOW (ref >60)
GLUCOSE: 75 mg/dL (ref 65–99)
POTASSIUM: 4.2 mmol/L (ref 3.5–5.1)
Sodium: 142 mmol/L (ref 135–145)

## 2017-01-25 LAB — TSH: TSH: 1.685 u[IU]/mL (ref 0.350–4.500)

## 2017-01-25 LAB — TROPONIN I
Troponin I: 0.24 ng/mL (ref <0.03)
Troponin I: 0.47 ng/mL (ref <0.03)
Troponin I: 0.42 ng/mL (ref <0.03)

## 2017-01-25 LAB — BRAIN NATRIURETIC PEPTIDE: B NATRIURETIC PEPTIDE 5: 1608.5 pg/mL — AB (ref 0.0–100.0)

## 2017-01-25 MED ORDER — CLOPIDOGREL BISULFATE 75 MG PO TABS
75.0000 mg | ORAL_TABLET | Freq: Every day | ORAL | Status: DC
  Administered 2017-01-25 – 2017-01-26 (×2): 75 mg via ORAL
  Filled 2017-01-25 (×2): qty 1

## 2017-01-25 MED ORDER — SODIUM CHLORIDE 0.9 % IV SOLN: 250.0000 mL | INTRAVENOUS | Status: DC | PRN

## 2017-01-25 MED ORDER — ATORVASTATIN CALCIUM 40 MG PO TABS: 40.0000 mg | ORAL_TABLET | Freq: Every day | ORAL | Status: DC

## 2017-01-25 MED ORDER — SODIUM CHLORIDE 0.9% FLUSH
3.0000 mL | Freq: Two times a day (BID) | INTRAVENOUS | Status: DC
  Administered 2017-01-25 (×3): 3 mL via INTRAVENOUS

## 2017-01-25 MED ORDER — NIFEDIPINE ER OSMOTIC RELEASE 30 MG PO TB24: 60.0000 mg | ORAL_TABLET | Freq: Every day | ORAL | Status: DC

## 2017-01-25 MED ORDER — ATORVASTATIN CALCIUM 40 MG PO TABS
40.0000 mg | ORAL_TABLET | Freq: Every day | ORAL | Status: DC
  Administered 2017-01-25 – 2017-01-26 (×2): 40 mg via ORAL
  Filled 2017-01-25 (×2): qty 1

## 2017-01-25 MED ORDER — ONDANSETRON HCL 4 MG/2ML IJ SOLN: 4.0000 mg | Freq: Four times a day (QID) | INTRAMUSCULAR | Status: DC | PRN

## 2017-01-25 MED ORDER — NITROGLYCERIN 0.4 MG SL SUBL: 0.4000 mg | SUBLINGUAL_TABLET | SUBLINGUAL | Status: DC | PRN

## 2017-01-25 MED ORDER — GABAPENTIN 100 MG PO CAPS
100.0000 mg | ORAL_CAPSULE | Freq: Three times a day (TID) | ORAL | Status: DC
  Administered 2017-01-25 – 2017-01-26 (×6): 100 mg via ORAL
  Filled 2017-01-25 (×6): qty 1

## 2017-01-25 MED ORDER — ENOXAPARIN SODIUM 30 MG/0.3ML ~~LOC~~ SOLN
30.0000 mg | Freq: Every day | SUBCUTANEOUS | Status: DC
  Administered 2017-01-25: 30 mg via SUBCUTANEOUS
  Filled 2017-01-25: qty 0.3

## 2017-01-25 MED ORDER — NEPRO/CARBSTEADY PO LIQD
237.0000 mL | Freq: Two times a day (BID) | ORAL | Status: DC
  Administered 2017-01-25 – 2017-01-26 (×3): 237 mL via ORAL
  Filled 2017-01-25 (×7): qty 237

## 2017-01-25 MED ORDER — METOPROLOL TARTRATE 25 MG PO TABS: 25.0000 mg | ORAL_TABLET | Freq: Two times a day (BID) | ORAL | Status: DC

## 2017-01-25 MED ORDER — METOPROLOL TARTRATE 25 MG PO TABS
25.0000 mg | ORAL_TABLET | Freq: Two times a day (BID) | ORAL | Status: DC
  Administered 2017-01-25 (×2): 25 mg via ORAL
  Filled 2017-01-25 (×2): qty 1

## 2017-01-25 MED ORDER — ASPIRIN 81 MG PO CHEW
81.0000 mg | CHEWABLE_TABLET | Freq: Every day | ORAL | Status: DC
  Administered 2017-01-25 – 2017-01-26 (×2): 81 mg via ORAL
  Filled 2017-01-25 (×2): qty 1

## 2017-01-25 MED ORDER — HYDRALAZINE HCL 20 MG/ML IJ SOLN: 5.0000 mg | INTRAMUSCULAR | Status: DC | PRN

## 2017-01-25 MED ORDER — ELBASVIR-GRAZOPREVIR 50-100 MG PO TABS: 1.0000 | ORAL_TABLET | Freq: Every day | ORAL | Status: DC

## 2017-01-25 MED ORDER — SODIUM CHLORIDE 0.9% FLUSH: 3.0000 mL | INTRAVENOUS | Status: DC | PRN

## 2017-01-25 MED ORDER — CALCIUM ACETATE (PHOS BINDER) 667 MG PO CAPS
1334.0000 mg | ORAL_CAPSULE | Freq: Three times a day (TID) | ORAL | Status: DC
  Administered 2017-01-25 – 2017-01-26 (×4): 1334 mg via ORAL
  Filled 2017-01-25 (×4): qty 2

## 2017-01-25 MED ORDER — ISOSORB DINITRATE-HYDRALAZINE 20-37.5 MG PO TABS
1.0000 | ORAL_TABLET | Freq: Three times a day (TID) | ORAL | Status: DC
  Administered 2017-01-25 (×2): 1 via ORAL
  Filled 2017-01-25 (×2): qty 1

## 2017-01-25 MED ORDER — ACETAMINOPHEN 325 MG PO TABS: 650.0000 mg | ORAL_TABLET | ORAL | Status: DC | PRN

## 2017-01-25 NOTE — Progress Notes (Signed)
CRITICAL VALUE ALERT  Critical value received:  Troponin 0.24  Date of notification:  01/25/17   Time of notification: 2:02 AM   Critical value read back:Yes.    Nurse who received alert:  Doree Albee, RN  MD notified (1st page):  Family medicine teaching service:  Time of first page:  2:05am  MD notified (2nd page):2:07  Per family medicine teaching service Just keep an eye on trops and keep trending them until the morning. No further orders placed. Will continue to monitor patient.

## 2017-01-25 NOTE — ED Notes (Signed)
Provided meal for pt

## 2017-01-25 NOTE — Evaluation (Signed)
Occupational Therapy Evaluation Patient Details Name: Trevor [name] MRN: [id number] DOB: February 11, 1946 Today's Date: 01/25/2017    History of Present Illness 71 y.o. male presenting with elevated blood pressure. PMH is significant for ESRD on dialysis (M/W/F), HTN, HLD, non-occlusive CAD (12/17 Cath 60% stenosis to CFX and 60 Dx1), MI, CHF (EF 45-50%, mod LVH, G1DD in 12/2015), Paroxysmal Afib, AAA, CVA    Clinical Impression   Pt reports he was independent with BADL PTA. Currently pt min assist for functional mobility and min guard-min assist for ADL. Pt presenting with impaired sitting and standing balance, limited ROM/sensation/strength RUE, and ?impaired cognition impacting his independence and safety with ADL and functional mobility. Recommending [name] for follow up to maximize independence and safety with ADL and functional mobility prior to return home alone. Pt would benefit from continued skilled OT to address established goals.    Follow Up Recommendations  [name]    Equipment Recommendations  None recommended by OT    Recommendations for Other Services       Precautions / Restrictions Precautions Precautions: Fall Restrictions Weight Bearing Restrictions: No      Mobility Bed Mobility Overal bed mobility: Needs Assistance Bed Mobility: Rolling;Supine to Sit;Sit to Supine Rolling: Min guard   Supine to sit: Min assist Sit to supine: Min guard   General bed mobility comments: Min assist to elevate trunk and come to EOB, min guard with cues for repositioning upon returning to supine  Transfers Overall transfer level: Needs assistance Equipment used: 1 person hand held assist Transfers: Sit to/from Stand Sit to Stand: Min assist         General transfer comment: Min assist during power up with noted LOB posteriorly requiring increased assist to prevent fall    Balance Overall balance assessment: Needs assistance   Sitting balance-Leahy Scale: Fair        Standing balance-Leahy Scale: Poor Standing balance comment: reliance on UE support, upon attempting no support patient with significant LOB falling out and catching himself against door frame/wall                            ADL Overall ADL's : Needs assistance/impaired Eating/Feeding: Set up;Sitting   Grooming: Min guard;Standing   Upper Body Bathing: Min guard;Sitting   Lower Body Bathing: Minimal assistance;Sit to/from stand   Upper Body Dressing : Min guard;Sitting Upper Body Dressing Details (indicate cue type and reason): to don hospital gown Lower Body Dressing: Minimal assistance;Sit to/from stand Lower Body Dressing Details (indicate cue type and reason): Able to adjust socks in sitting. Min assist for standing balance Toilet Transfer: Minimal assistance;Ambulation;BSC Toilet Transfer Details (indicate cue type and reason): Simulated by sit to stand from EOB with functional mobility in room.         Functional mobility during ADLs: Minimal assistance (hand held assist) General ADL Comments: VSS throughout; pt c/o fatigue.     Vision   Vision Assessment?: No apparent visual deficits     Perception     Praxis      Pertinent Vitals/Pain Pain Assessment: No/denies pain     Hand Dominance Right   Extremity/Trunk Assessment Upper Extremity Assessment Upper Extremity Assessment: RUE deficits/detail RUE Deficits / Details: 4/5 strength limited functional ROM. decreased sensation RUE Sensation: decreased light touch RUE Coordination: decreased fine motor   Lower Extremity Assessment Lower Extremity Assessment: Defer to PT evaluation RLE Deficits / Details: RLE weakness 3-/5 RLE Sensation: decreased light touch  RLE Coordination: decreased fine motor;decreased gross motor       Communication Communication Communication: No difficulties   Cognition Arousal/Alertness: Awake/alert Behavior During Therapy: Flat affect Overall Cognitive Status: No  family/caregiver present to determine baseline cognitive functioning                     General Comments       Exercises       Shoulder Instructions      Home Living Family/patient expects to be discharged to:: Private residence Living Arrangements: Alone Available Help at Discharge: Personal care attendant;Available PRN/intermittently Type of Home: House Home Access: Ramped entrance     Home Layout: One level     Bathroom Shower/Tub: Walk-in shower;Door   [employer] Toilet: Standard Bathroom Accessibility: Yes   Home Equipment: Environmental consultant - 4 wheels;Cane - single point;Bedside commode;Wheelchair - Biomedical scientist Comments: HH aide 4 days/wk, assists with cooking and cleaning      Prior Functioning/Environment Level of Independence: Needs assistance  Gait / Transfers Assistance Needed: cane in the house, wheelchair for HD ADL's / Homemaking Assistance Needed: reports he is independent with BADL, aide assists with cooking and cleaning   Comments: Takes [employer] bus to HD 3x/week        OT Problem List: Decreased strength;Decreased activity tolerance;Decreased range of motion;Impaired balance (sitting and/or standing);Decreased knowledge of use of DME or AE;Cardiopulmonary status limiting activity;Impaired UE functional use      OT Treatment/Interventions: Self-care/ADL training;Energy conservation;DME and/or AE instruction;Therapeutic activities;Patient/family education;Balance training    OT Goals(Current goals can be found in the care plan section) Acute Rehab OT Goals Patient Stated Goal: to feel better OT Goal Formulation: With patient Time For Goal Achievement: 02/08/17 Potential to Achieve Goals: Good ADL Goals Pt Will Perform Grooming: with modified independence;standing Pt Will Perform Upper Body Bathing: with modified independence;sitting Pt Will Perform Lower Body Bathing: with modified independence;sit to/from stand Pt Will Transfer to  Toilet: with modified independence;ambulating;bedside commode (over toilet) Pt Will Perform Toileting - Clothing Manipulation and hygiene: with modified independence;sit to/from stand Pt Will Perform Tub/Shower Transfer: Shower transfer;with modified independence;ambulating;shower seat  OT Frequency: Min 2X/week   Barriers to D/C: Decreased caregiver support  pt lives alone       Co-evaluation PT/OT/SLP Co-Evaluation/Treatment: Yes Reason for Co-Treatment: Other (comment);For patient/therapist safety (for imminent d/c needs)   OT goals addressed during session: ADL's and self-care      End of Session    Activity Tolerance: Patient tolerated treatment well Patient left: in bed;with call bell/phone within reach;with bed alarm set  OT Visit Diagnosis: Unsteadiness on feet (R26.81);Muscle weakness (generalized) (M62.81)                ADL either performed or assessed with clinical judgement  Time: 8563-1497 OT Time Calculation (min): 17 min Charges:  OT General Charges $OT Visit: 1 Procedure OT Evaluation $OT Eval Moderate Complexity: 1 Procedure G-Codes: OT G-codes **NOT FOR INPATIENT CLASS** Functional Assessment Tool Used: Clinical judgement Functional Limitation: Self care Self Care Current Status (W2637): At least 1 percent but less than 20 percent impaired, limited or restricted Self Care Goal Status (C5885): At least 1 percent but less than 20 percent impaired, limited or restricted   Mel Almond A. Ulice Brilliant, M.S., OTR/L Pager: [place] 01/25/2017, 4:14 PM

## 2017-01-25 NOTE — Progress Notes (Signed)
Family Medicine Teaching Service Daily Progress Note Intern Pager: [phone]  Patient name: Trevor [name] Medical record number: [id number] Date of birth: 1946/08/09 Age: 71 y.o. Gender: male  Primary Care Provider: [place] [name] Consultants: none Code Status: FULL   Pt Overview and Major Events to Date:  01/24/17: Admitted  Assessment and Plan: Trevor [name] is a 71 y.o. male presenting with elevated blood pressure. PMH is significant for ESRD on dialysis (M/W/F), HTN, HLD, non-occlusive CAD (12/17 Cath 60% stenosis to CFX and 60 Dx1), MI, CHF (EF 45-50%, mod LVH, G1DD in 12/2015), Paroxysmal Afib, AAA, CVA   # Hypertensive urgency, improved - BP 164/56 this am.  Got no antihypertensives since EMS ride yesterday.  HR 59.  Associated with poor medication adherence.  TSH 1.685. Lipid w/ low HDL 37.  Trop 0.03>0.24 - telemetry - trend troponin >> ESRD makes this less reliable. Discussed care with Dr Trevor [name] this am.  Agrees that troponin likely from ESRD.  EKG baseline.  QTc prolongation resolved. Ok to [employer] home. - Will add back BP meds today. - PRN hydralazine 5 mg for SBP>180, DBP>110 - Risk strat labs: HbA1c (pending)  # Chest pain and EKG changes, resolved - Likely due to demand in setting of hypertensive urgency. ECG by EMS showed ST elevation in VR with ST depression in V3-V6 and inferior leads.  Repeat ECG in ED showed resolution of prior ST changes with LVH and repol similar to baseline ECG, QTc prolongation.  2/25 EKG much more similar to 12/09/16 EKG - monitor on tele, trend troponin  # ESRD on HD MWF, stable - Patient went for HD on Friday. Follows with Dr. Marval [name] at [facility]. HD since 2014. - continue home phoslo - can call nephrology in AM for HD if patient is staying until Monday  # CAD/HLD, stable - Lipid panel on admission Chol 135, Triglycerides 119, HDL 37, LDL 74.  - continue home lipitor 40 mg QD, ASA 81 mg QD, plavix 74 mg QD  # Afib, paroxysmal -  currently in NSR per ECG from admission - continue home metoprolol tartrate 25 mg BID - continue home ASA, Plavix - telemetry as above  # CHF, stable  - low suspicion for exacerbation, patient appears euvolemic. EF 45-50% with moderate LVH and grade 1 DD by echo Jan 2017.  BNP 1608.5 (appears to be similar to previous).  Dry weight 131# per renal note in 12/2016.  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 - stabilize BP  FEN/GI: SLIV, renal/carb mod diet Prophylaxis: SCDs  Disposition: Plan to discharge home pending my attending's assessment  Subjective:  Patient reports that he is feeling fine this am.  Denies CP, SOB, nausea, vomiting, diaphoresis, headache.  Objective: Temp:  [97.6 F (36.4 C)-98 F (36.7 C)] 97.8 F (36.6 C) (02/25 0504) Pulse Rate:  [59-82] 59 (02/25 0504) Resp:  [11-20] 18 (02/25 0504) BP: (138-179)/(56-74) 164/56 (02/25 0504) SpO2:  [95 %-100 %] 97 % (02/25 0504) Weight:  [127 lb 1.6 oz (57.7 kg)] 127 lb 1.6 oz (57.7 kg) (02/25 0031) Physical Exam: General: awake, alert, sitting up on edge of bed, RN at bedside Cardiovascular: RRR, no murmurs Respiratory: CTAB, normal WOB on room air Abdomen: flat, soft, NT/ND Extremities: WWP, no edema Neuro: follows commands, no focal  deficits Psych: mood stable, speech normal.  Laboratory:  Recent Labs Lab 01/24/17 1935 01/25/17 0714  WBC 4.8 4.1  HGB 12.0* 11.0*  HCT 36.3* 33.4*  PLT 168 152    Recent Labs Lab 01/24/17 1935  NA 140  K 4.0  CL 98*  CO2 27  BUN 19  CREATININE 6.79*  CALCIUM 9.9  PROT 7.0  BILITOT 0.5  ALKPHOS 78  ALT 8*  AST 15  GLUCOSE 95    Cardiac Panel (last 3 results)  Recent Labs  01/24/17 1935 01/25/17 0059  TROPONINI 0.03* 0.24*   Lipid Panel     Component  Value Date/Time   CHOL 135 01/24/2017 1935   TRIG 119 01/24/2017 1935   HDL 37 (L) 01/24/2017 1935   CHOLHDL 3.6 01/24/2017 1935   VLDL 24 01/24/2017 1935   [place] 74 01/24/2017 1935   Imaging/Diagnostic Tests: No results found.  Trevor Norlander, DO 01/25/2017, 8:06 AM PGY-3, [place] Intern pager: [phone], text pages welcome

## 2017-01-25 NOTE — ED Notes (Signed)
Pt complaining of numbness to R thigh and bottom of R foot, pt states that this is ongoing issue for several weeks. Dr. Melina Copa made aware.

## 2017-01-25 NOTE — Discharge Instructions (Signed)
You were admitted for chest pain evaluation.  Your case was reviewed with a cardiologist.  No further workup was recommended.  You were evaluated by physical therapy, who does recommend that you continue physical therapy as scheduled.  Please follow up closely with your primary care doctor for your weakness.  Your doctor may need to extend your physical therapy sessions, given your continued weakness.

## 2017-01-25 NOTE — Evaluation (Signed)
Physical Therapy Evaluation Patient Details Name: Trevor [name] MRN: [id number] DOB: Jul 16, 1946 Today's Date: 01/25/2017   History of Present Illness  71 y.o. male presenting with elevated blood pressure. PMH is significant for ESRD on dialysis (M/W/F), HTN, HLD, non-occlusive CAD (12/17 Cath 60% stenosis to CFX and 24 Dx1), MI, CHF (EF 45-50%, mod LVH, G1DD in 12/2015), Paroxysmal Afib, AAA, CVA   Clinical Impression  Patient demonstrates deficits in functional mobility as indicated below. Will need continued skilled PT to address deficits and maximize function. Will see as indicated and progress as tolerated. At this time, concerned regarding patient's level of deconditioning and instability. Do not feel patient would be safe for d/c home alone in current condition given increased fall risk in conjunction with patient also on dialysis. Recommend ST [name].    Follow Up Recommendations [name];Supervision/Assistance - 24 hour    Equipment Recommendations  None recommended by PT    Recommendations for Other Services       Precautions / Restrictions Precautions Precautions: Fall Restrictions Weight Bearing Restrictions: No      Mobility  Bed Mobility Overal bed mobility: Needs Assistance Bed Mobility: Rolling;Supine to Sit;Sit to Supine Rolling: Min guard   Supine to sit: Min assist Sit to supine: Min guard   General bed mobility comments: Min assist to elevate trunk and come to EOB, min guard with cues for repositioning upon returning to supine  Transfers Overall transfer level: Needs assistance Equipment used: 1 person hand held assist Transfers: Sit to/from Stand Sit to Stand: Min assist         General transfer comment: Min assist during power up with noted LOB posteriorly requiring increased assist to prevent fall  Ambulation/Gait Ambulation/Gait assistance: Min assist;Mod assist (3 episodes of moderate assist to prevent complete LOB) Ambulation Distance (Feet): 24  Feet Assistive device: 1 person hand held assist Gait Pattern/deviations: Step-through pattern;Decreased stride length;Decreased dorsiflexion - right;Shuffle;Drifts right/left;Trunk flexed;Narrow base of support Gait velocity: decreased   General Gait Details: patient with increased instability during very short ambulation distance, limited righting reactions and poor ability to maintain upright, required min to moderate assist.   Stairs            Wheelchair Mobility    Modified Rankin (Stroke Patients Only)       Balance Overall balance assessment: Needs assistance   Sitting balance-Leahy Scale: Fair       Standing balance-Leahy Scale: Poor Standing balance comment: reliance on UE support, upon attempting no support patient with significant LOB falling out and catching himself against door frame/wall                             Pertinent Vitals/Pain Pain Assessment: No/denies pain    Home Living Family/patient expects to be discharged to:: Private residence Living Arrangements: Alone Available Help at Discharge: Personal care attendant;Available PRN/intermittently Type of Home: House Home Access: Ramped entrance     Home Layout: One level Home Equipment: [place] - 4 wheels;Cane - single point;Bedside commode;Wheelchair - Sport and exercise psychologist Comments: HH aide 4 days/wk, assists with cooking and cleaning    Prior Function Level of Independence: Needs assistance   Gait / Transfers Assistance Needed: cane in the house, wheelchair for HD  ADL's / Homemaking Assistance Needed: reports he is independent with BADL, aide assists with cooking and cleaning  Comments: Takes [employer] bus to HD 3x/week     Hand Dominance   Dominant Hand: Right  Extremity/Trunk Assessment   Upper Extremity Assessment Upper Extremity Assessment: RUE deficits/detail RUE Deficits / Details: 4/5 strength limited functional ROM. decreased sensation RUE Sensation:  decreased light touch RUE Coordination: decreased fine motor    Lower Extremity Assessment Lower Extremity Assessment: RLE deficits/detail RLE Deficits / Details: RLE weakness 3-/5 RLE Sensation: decreased light touch RLE Coordination: decreased fine motor;decreased gross motor       Communication   Communication: No difficulties  Cognition Arousal/Alertness: Awake/alert Behavior During Therapy: Flat affect Overall Cognitive Status: No family/caregiver present to determine baseline cognitive functioning                      General Comments      Exercises     Assessment/Plan    PT Assessment Patient needs continued PT services  PT Problem List Decreased strength;Decreased activity tolerance;Decreased balance;Decreased mobility;Decreased coordination;Decreased cognition;Decreased safety awareness;Impaired sensation       PT Treatment Interventions DME instruction;Gait training;Functional mobility training;Therapeutic activities;Therapeutic exercise;Balance training;Patient/family education    PT Goals (Current goals can be found in the Care Plan section)  Acute Rehab PT Goals Patient Stated Goal: to feel better PT Goal Formulation: With patient Time For Goal Achievement: 02/08/17 Potential to Achieve Goals: Fair    Frequency Min 2X/week   Barriers to discharge Decreased caregiver support patient lives alone and does not have adequate assist    Co-evaluation               End of Session   Activity Tolerance: Patient limited by fatigue Patient left: in bed;with call bell/phone within reach;with bed alarm set Nurse Communication: Mobility status PT Visit Diagnosis: Unsteadiness on feet (R26.81);Muscle weakness (generalized) (M62.81);Difficulty in walking, not elsewhere classified (R26.2)    Functional Assessment Tool Used: Clinical judgement Functional Limitation: Mobility: Walking and moving around Mobility: Walking and Moving Around Current  Status (W5809): At least 40 percent but less than 60 percent impaired, limited or restricted Mobility: Walking and Moving Around Goal Status [phone]): At least 1 percent but less than 20 percent impaired, limited or restricted    Time: 2505-3976 PT Time Calculation (min) (ACUTE ONLY): 17 min   Charges:   PT Evaluation $PT Eval Moderate Complexity: 1 Procedure     PT G Codes:   PT G-Codes **NOT FOR INPATIENT CLASS** Functional Assessment Tool Used: Clinical judgement Functional Limitation: Mobility: Walking and moving around Mobility: Walking and Moving Around Current Status (B3419): At least 40 percent but less than 60 percent impaired, limited or restricted Mobility: Walking and Moving Around Goal Status [phone]): At least 1 percent but less than 20 percent impaired, limited or restricted     Duncan Dull 01/25/2017, 4:09 PM Alben Deeds, [place] DPT  [phone]

## 2017-01-25 NOTE — Discharge Summary (Signed)
[facility] Discharge Summary  Patient name: Trevor [name] Medical record number: [id number] Date of birth: [birth year] Age: 71 y.o. Gender: male Date of Admission: 01/24/2017  Date of Discharge: 01/26/2017 Admitting Physician: Lind Covert, MD  Primary Care Provider: [facility] Consultants: No formal. (phone consult to cardiology)  Indication for Hospitalization:  Chest pain evaluation  Discharge Diagnoses/Problem List:  Principal Problem:   Hypertensive urgency Active Problems:   Chronic combined systolic and diastolic CHF (congestive heart failure) ([place])   ESRD on hemodialysis ([place])   Chest pain   Accelerated hypertension   Weakness generalized   Weakness of both legs   Disposition: Home with [facility] services  Discharge Condition: Stable.  Discharge Exam:  Temp:  [98.4 F (36.9 C)] 98.4 F (36.9 C) (02/26 0425) Pulse Rate:  [60-66] 63 (02/26 0425) Resp:  [18] 18 (02/26 0425) BP: (135-165)/(53-63) 149/62 (02/26 0425) SpO2:  [100 %] 100 % (02/26 0425) Weight:  [133 lb 6.1 oz (60.5 kg)] 133 lb 6.1 oz (60.5 kg) (02/26 0425) Physical Exam: General: awake, alert, sitting at the sink doing a sponge bath, RN at bedside Cardiovascular: RRR, no murmurs Respiratory: CTA nil, normal WOB on room air Abdomen: Nondistended, nontender Extremities: No edema lower extremities bilaterally Neuro: follows commands, no focal deficits Psych: mood stable, speech normal.  Brief Hospital Course:  Trevor [name] is a 71 y.o. male that presented to [facility] via EMS for chest pain associated with hypertensive urgency.  He had a recent admission in early Jan 2018 for similar. PMH is significant for ESRD on dialysis (M/W/F), HTN, HLD, non-occlusive CAD (12/17 Cath 60% stenosis to CFX and 66 Dx1), MI, CHF (EF 45-50%, mod LVH, G1DD in 12/2015), Paroxysmal Afib, AAA, CVA.    Chest pain resolved on way to hospital.  No repeat episodes of CP during  hospitalization.  His EKG by EMS showed ST elevation in VR with ST depression in V3-V6 and inferior leads.  Repeat EKG in ED showed resolution of these changes.  Code STEMI was cancelled by cardiology.  His troponin was trended during hospitalization.  This plateaud at 0.47.  His care was discussed with cardiology.  No further cardiac workup was recommended and he was cleared to be discharged from cardiac standpoint.  Additionally, patient noted to be markedly weak on examination.  He was evaluated by PT/OT who both recommended [name] placement for continued rehabilitation, however patient wanted to go home, and has capacity to make that decision. He had home health services available to him from prior to this admission.  Blood pressure - Patient was restarted on home medication regimen. Isosorbide-hydralazine (BIDIL) TID, metoprolol 25 mg BID. Education was provided about compliance to the patient. He stated that he becomes nervous about pressures dropping at dialysis.  Advised patient to take his blood pressure medications every day as prescribed to avoid future ED visits for hypertensive urgency.  Weakness - thought to be due to chronic deconditioning. Patient notes he has home health services with PT/OT/RN that come in multiple times per week. He does live at home alone. He is working on adding an aide come in for times a week. Patient is not interested in going to [name], he elects to go home with home health PT. Patient has capacity to make this decision.  ESRD on HD - patient has not missed any dialysis days. He received dialysis in the hospital prior to discharge.  Issues for Follow Up:  1. BP - Patient was  restarted on home medications; he was normotensive on these medications.  Please titrate as necessary after hospital discharge. Patient was instructed to take blood pressure medications as prescribed. 2. Weakness -  Likely due to chronic deconditioning. Continue home health PT/OT.  Significant  Procedures: none  Significant Labs and Imaging:   Recent Labs Lab 01/24/17 1935 01/25/17 0714  WBC 4.8 4.1  HGB 12.0* 11.0*  HCT 36.3* 33.4*  PLT 168 152    Recent Labs Lab 01/24/17 1935 01/25/17 0714  NA 140 142  K 4.0 4.2  CL 98* 103  CO2 27 29  GLUCOSE 95 75  BUN 19 22*  CREATININE 6.79* 7.76*  CALCIUM 9.9 9.6  ALKPHOS 78  --   AST 15  --   ALT 8*  --   ALBUMIN 3.4*  --     Lipid Panel     Component Value Date/Time   CHOL 135 01/24/2017 1935   TRIG 119 01/24/2017 1935   HDL 37 (L) 01/24/2017 1935   CHOLHDL 3.6 01/24/2017 1935   VLDL 24 01/24/2017 1935   LDLCALC 74 01/24/2017 1935   Cardiac Panel (last 3 results)  Recent Labs  01/25/17 0059 01/25/17 0714 01/25/17 1148  TROPONINI 0.24* 0.47* 0.42*   No results found.  Results/Tests Pending at Time of Discharge: none  Discharge Medications:  Allergies as of 01/26/2017   No Known Allergies     Medication List    TAKE these medications   acetaminophen 325 MG tablet Commonly known as:  TYLENOL Take 2 tablets (650 mg total) by mouth every 4 (four) hours as needed for headache or mild pain.   aspirin 81 MG chewable tablet Chew 1 tablet (81 mg total) by mouth daily.   atorvastatin 40 MG tablet Commonly known as:  LIPITOR Take 40 mg by mouth at bedtime.   calcium acetate 667 MG capsule Commonly known as:  PHOSLO Take 1,334 mg by mouth 3 (three) times daily before meals.   clopidogrel 75 MG tablet Commonly known as:  PLAVIX Take 1 tablet (75 mg total) by mouth daily.   docusate sodium 100 MG capsule Commonly known as:  COLACE Take 200 mg by mouth daily.   feeding supplement (NEPRO CARB STEADY) Liqd Take 237 mLs by mouth 2 (two) times daily between meals.   Fish Oil 1000 MG Caps Take 1,000 mg by mouth 2 (two) times daily.   gabapentin 100 MG capsule Commonly known as:  NEURONTIN Take 1 capsule (100 mg total) by mouth 3 (three) times daily.   isosorbide-hydrALAZINE 20-37.5 MG  tablet Commonly known as:  BIDIL Take 1 tablet by mouth 3 (three) times daily.   metoprolol tartrate 25 MG tablet Commonly known as:  LOPRESSOR Take 1 tablet (25 mg total) by mouth 2 (two) times daily.   NIFEdipine 60 MG 24 hr tablet Commonly known as:  PROCARDIA-XL/ADALAT CC Take 1 tablet (60 mg total) by mouth daily.   ZEPATIER 50-100 MG Tabs Generic drug:  Elbasvir-Grazoprevir Take 1 tablet by mouth daily.       Discharge Instructions: Please refer to Patient Instructions section of EMR for full details.  Patient was counseled important signs and symptoms that should prompt return to medical care, changes in medications, dietary instructions, activity restrictions, and follow up appointments.   Follow-Up Appointments: Follow-up Information    [facility]. Schedule an appointment as soon as possible for a visit in 1 week(s).   Why:  Hospital follow up Contact information: [address]  [place] [address] [phone]           Everrett Coombe, MD 01/27/2017, 1:55 PM PGY-1, [place]

## 2017-01-26 DIAGNOSIS — I5042 Chronic combined systolic (congestive) and diastolic (congestive) heart failure: Secondary | ICD-10-CM

## 2017-01-26 DIAGNOSIS — Z992 Dependence on renal dialysis: Secondary | ICD-10-CM

## 2017-01-26 DIAGNOSIS — N186 End stage renal disease: Secondary | ICD-10-CM

## 2017-01-26 LAB — HEMOGLOBIN A1C

## 2017-01-26 MED ORDER — LIDOCAINE HCL (PF) 1 % IJ SOLN: 5.0000 mL | INTRAMUSCULAR | Status: DC | PRN

## 2017-01-26 MED ORDER — LIDOCAINE-PRILOCAINE 2.5-2.5 % EX CREA
1.0000 "application " | TOPICAL_CREAM | CUTANEOUS | Status: DC | PRN
  Filled 2017-01-26: qty 5

## 2017-01-26 MED ORDER — PENTAFLUOROPROP-TETRAFLUOROETH EX AERO: 1.0000 "application " | INHALATION_SPRAY | CUTANEOUS | Status: DC | PRN

## 2017-01-26 MED ORDER — SODIUM CHLORIDE 0.9 % IV SOLN: 100.0000 mL | INTRAVENOUS | Status: DC | PRN

## 2017-01-26 NOTE — Care Management Note (Addendum)
Case Management Note [name] RN, BSN Unit 2W-Case Manager [phone]  Patient Details  Name: Trevor [name] MRN: [id number] Date of Birth: [birth year]  Subjective/Objective:   Pt admitted with hypertensive urgency- hx of ESRD- HD-M/W/F                  Action/Plan: PTA pt lived at home- was active with [facility] for HHPT/OT will need [place] orders on discharge for resumption of HH services-PT eval recommendations for [name]-  per conversation with pt - pt wants to return home again with [facility] services- pt states that he recently was in a [name] and just returned home- does not want to go back to [name] at this time- pt also reports that he has a PCS aide that comes 4 days/week several hours per day through [facility]- Pt states that he has RW, BSC, canes, and a w/c at home- no other DME needs noted. Pt states that he uses [employer] for transportation - also has family that can provide transportation after work hours. CM will follow for [facility] resumption needs  Expected Discharge Date:  01/26/17   01/26/17            Expected Discharge Plan:  [place]  In-House Referral:     Discharge planning Services  CM Consult  Post Acute Care Choice:  Home Health, Resumption of Svcs/PTA Provider Choice offered to:  Patient  DME Arranged:    DME Agency:     HH Arranged:  PT, OT HH Agency:  [facility]  Status of Service:  Completed, signed off  If discussed at [place] of Stay Meetings, dates discussed:    Discharge Disposition: home with home health   Additional Comments:  01/26/17- 2120-Kya Mayfield Dahlia Client RN, CM- noticed pt d/c'd after hours- HH orders have been placed to resume services for HHPT/OT- will contact [facility] in am for resumption- will also call [facility] for aide services to let them know pt discharged home. ([facility] [phone])  [name], RN 01/26/2017, 9:26 PM

## 2017-01-26 NOTE — Consult Note (Signed)
[facility] Renal Consultation Note    Indication for Consultation:  Management of ESRD/hemodialysis, anemia, hypertension/volume, and secondary hyperparathyroidism. PCP:  HPI: Trevor [name] is a 71 y.o. male with ESRD, HTN, CAD, paroxysmal AF, Hep C, Hx CVA, Hx AAA who was admitted with chest pain and hypertensive urgency on 01/24/17. Initially with EKG changes concerning for ACS by EMS, resolved on repeat in ED.  Since admit, medications have been adjusted. He underwent serial cardiac enzymes which did not spike significantly. Prior cardiac cath in 10/2016 showed 60% non-occlusive CAD. CXR showed no abnormalities. He is due for dialysis today, per usual MWF schedule. Currently, denies CP or dyspnea. No abdominal symptoms, no N/V/D.  Past Medical History:  Diagnosis Date  . AAA (abdominal aortic aneurysm) ([place])   . Anxiety   . Arthritis   . Blindness   . CHF (congestive heart failure) ([place])   . 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 ESRD on hemodialysis ([place])    started HD 2014-15  . Headache(784.0)   . HTN (hypertension)   . Protein-calorie malnutrition ([place])   . Stroke [facility])    Past Surgical History:  Procedure Laterality Date  . CARDIAC CATHETERIZATION N/A 11/01/2016   Procedure: Left Heart Cath and Coronary Angiography;  Surgeon: Lorretta Harp, MD;  Location: [place] CV LAB;  Service: Cardiovascular;  Laterality: N/A;  . INSERTION OF DIALYSIS CATHETER  09/21/2012   Procedure: INSERTION OF DIALYSIS CATHETER;  Surgeon: Angelia Mould, MD;  Location: [place];  Service: Vascular;  Laterality: N/A;  Right Internal Jugular Placement  . LEFT HEART CATHETERIZATION WITH CORONARY ANGIOGRAM N/A 09/28/2012   Procedure: LEFT HEART CATHETERIZATION WITH CORONARY ANGIOGRAM;  Surgeon: Sherren Mocha, MD;  Location: [facility] CATH LAB;  Service: Cardiovascular;  Laterality: N/A;  . LEFT HEART CATHETERIZATION WITH CORONARY  ANGIOGRAM N/A 07/07/2014   Procedure: LEFT HEART CATHETERIZATION WITH CORONARY ANGIOGRAM;  Surgeon: Leonie Man, MD;  Location: [facility] CATH LAB;  Service: Cardiovascular;  Laterality: N/A;  . right hand    . TEE WITHOUT CARDIOVERSION  10/09/2011   Procedure: TRANSESOPHAGEAL ECHOCARDIOGRAM (TEE);  Surgeon: Lelon Perla, MD;  Location: [facility] ENDOSCOPY;  Service: Cardiovascular;  Laterality: N/A;   Family History  Problem Relation Age of Onset  . Heart attack Mother     MI in her 3s  . Diabetes Mother   . Alcohol abuse Father   . Anesthesia problems Neg Hx   . Hypotension Neg Hx   . Malignant hyperthermia Neg Hx   . Pseudochol deficiency Neg Hx    Social History:  reports that he has been smoking Cigarettes.  He has a 25.00 pack-year smoking history. He has never used smokeless tobacco. He reports that he does not drink alcohol or use drugs.  ROS: As per HPI otherwise negative.  Physical Exam: Vitals:   01/25/17 1024 01/25/17 2004 01/25/17 2122 01/26/17 0425  BP: (!) 164/62 135/63 (!) 165/53 (!) 149/62  Pulse: 60 65 66 63  Resp:  18  18  Temp:  98.4 F (36.9 C)  98.4 F (36.9 C)  TempSrc:  Oral  Oral  SpO2: 100% 100%  100%  Weight:    60.5 kg (133 lb 6.1 oz)  Height:         General: Frail elderly male, in no acute distress. Head: Normocephalic, atraumatic, sclera non-icteric, mucus membranes are moist. Neck: Supple without lymphadenopathy/masses. JVD not elevated. Lungs:  Coarse rales in L base which cleared with coughing. Clear otherwise. Heart: Irregularly irregular. Normal rate. No murmur. Abdomen: Soft, non-tender. Musculoskeletal:  Strength and tone appear normal for age. Lower extremities: No LE edema. Neuro: Alert and oriented X 3. Moves all extremities spontaneously. Psych:  Responds to questions appropriately with a normal affect. Dialysis Access: LUE aneurysmal AVF + thrill  No Known Allergies Prior to Admission medications   Medication Sig Start Date End Date  Taking? Authorizing Provider  acetaminophen (TYLENOL) 325 MG tablet Take 2 tablets (650 mg total) by mouth every 4 (four) hours as needed for headache or mild pain. 12/11/16  Yes Geradine Girt, DO  aspirin 81 MG chewable tablet Chew 1 tablet (81 mg total) by mouth daily. 11/04/16  Yes Brittainy Erie Noe, PA-C  atorvastatin (LIPITOR) 40 MG tablet Take 40 mg by mouth at bedtime.   Yes Historical Provider, MD  calcium acetate (PHOSLO) 667 MG capsule Take 1,334 mg by mouth 3 (three) times daily before meals.    Yes Historical Provider, MD  clopidogrel (PLAVIX) 75 MG tablet Take 1 tablet (75 mg total) by mouth daily. 04/05/15  Yes Barton Dubois, MD  docusate sodium (COLACE) 100 MG capsule Take 200 mg by mouth daily.    Yes Historical Provider, MD  Elbasvir-Grazoprevir (ZEPATIER) 50-100 MG TABS Take 1 tablet by mouth daily.   Yes Historical Provider, MD  gabapentin (NEURONTIN) 100 MG capsule Take 1 capsule (100 mg total) by mouth 3 (three) times daily. 04/05/15  Yes Barton Dubois, MD  isosorbide-hydrALAZINE (BIDIL) 20-37.5 MG tablet Take 1 tablet by mouth 3 (three) times daily. 11/03/16  Yes Brittainy Erie Noe, PA-C  metoprolol tartrate (LOPRESSOR) 25 MG tablet Take 1 tablet (25 mg total) by mouth 2 (two) times daily. 11/03/16  Yes Brittainy Erie Noe, PA-C  NIFEdipine (PROCARDIA-XL/ADALAT CC) 60 MG 24 hr tablet Take 1 tablet (60 mg total) by mouth daily. 12/12/16  Yes Geradine Girt, DO  Nutritional Supplements (FEEDING SUPPLEMENT, NEPRO CARB STEADY,) LIQD Take 237 mLs by mouth 2 (two) times daily between meals. 12/11/16  Yes Geradine Girt, DO  Omega-3 Fatty Acids (FISH OIL) 1000 MG CAPS Take 1,000 mg by mouth 2 (two) times daily.   Yes Historical Provider, MD   Current Facility-Administered Medications  Medication Dose Route Frequency Provider Last Rate Last Dose  . 0.9 %  sodium chloride infusion  250 mL Intravenous PRN Everrett Coombe, MD      . acetaminophen (TYLENOL) tablet 650 mg  650 mg Oral Q4H PRN Everrett Coombe, MD      . aspirin chewable tablet 81 mg  81 mg Oral Daily Everrett Coombe, MD   81 mg at 01/26/17 0957  . atorvastatin (LIPITOR) tablet 40 mg  40 mg Oral Daily Everrett Coombe, MD   40 mg at 01/26/17 0957  . calcium acetate (PHOSLO) capsule 1,334 mg  1,334 mg Oral TID WC Everrett Coombe, MD   1,334 mg at 01/26/17 0959  . clopidogrel (PLAVIX) tablet 75 mg  75 mg Oral Daily Everrett Coombe, MD   75 mg at 01/26/17 0957  . Elbasvir-Grazoprevir 50-100 MG TABS 1 tablet  1 tablet Oral Daily Everrett Coombe, MD      . feeding supplement (NEPRO CARB STEADY) liquid 237 mL  237 mL Oral BID BM Everrett Coombe, MD   237 mL at 01/26/17 1005  . gabapentin (NEURONTIN) capsule 100 mg  100 mg Oral TID Everrett Coombe, MD   100 mg at 01/26/17  0957  . hydrALAZINE (APRESOLINE) injection 5 mg  5 mg Intravenous Q4H PRN Everrett Coombe, MD      . isosorbide-hydrALAZINE (BIDIL) 20-37.5 MG per tablet 1 tablet  1 tablet Oral TID Janora Norlander, DO   1 tablet at 01/25/17 2122  . metoprolol tartrate (LOPRESSOR) tablet 25 mg  25 mg Oral BID Everrett Coombe, MD   25 mg at 01/25/17 2123  . nitroGLYCERIN (NITROSTAT) SL tablet 0.4 mg  0.4 mg Sublingual Q5 Min x 3 PRN Everrett Coombe, MD      . sodium chloride flush (NS) 0.9 % injection 3 mL  3 mL Intravenous Q12H Everrett Coombe, MD   3 mL at 01/25/17 2122  . sodium chloride flush (NS) 0.9 % injection 3 mL  3 mL Intravenous PRN Everrett Coombe, MD       Labs: Basic Metabolic Panel:  Recent Labs Lab 01/24/17 1935 01/25/17 0714  NA 140 142  K 4.0 4.2  CL 98* 103  CO2 27 29  GLUCOSE 95 75  BUN 19 22*  CREATININE 6.79* 7.76*  CALCIUM 9.9 9.6   Liver Function Tests:  Recent Labs Lab 01/24/17 1935  AST 15  ALT 8*  ALKPHOS 78  BILITOT 0.5  PROT 7.0  ALBUMIN 3.4*   CBC:  Recent Labs Lab 01/24/17 1935 01/25/17 0714  WBC 4.8 4.1  NEUTROABS 1.8  --   HGB 12.0* 11.0*  HCT 36.3* 33.4*  MCV 89.9 89.8  PLT 168 152   Cardiac Enzymes:  Recent Labs Lab 01/24/17 1935 01/25/17 0059 01/25/17 0714  01/25/17 1148  TROPONINI 0.03* 0.24* 0.47* 0.42*   Dialysis Orders:  MWF at [facility] 4 hours, BFR400/DFR800, EDW 58.5kg, 2K/2.25Ca, Profile 4, Heparin 1900 bolus, AVF - Mircera 78mcg q 2 weeks (last 2/21)  Assessment/Plan: 1.  ESRD: Will continue MWF schedule and dialyze today (2/26). Likely will be discharged afterwards. 2.  Hypertension/volume: BP improved with medications, but not quite to goal. No volume excess on exam. Keep current EDW for now and monitor. BP meds can be further titrated as an outpt if needed. 3.  Anemia: Hgb 11. Not due to ESA yet. 4.  Metabolic bone disease: Labs ok. No VDRA. 5.  Nutrition: Alb 3.4.  6.  Hepatitis C: Continue current meds. 7. pAF: On aspirin. Per primary.  Veneta Penton, PA-C 01/26/2017, 10:22 AM  [facility] Pager: [phone]  Pt seen, examined and agree w A/P as above.  Kelly Splinter MD [employer] pager [phone]   01/26/2017, 1:56 PM

## 2017-01-26 NOTE — Progress Notes (Signed)
Transitions of Care Pharmacy Note  Plan:  Medication list reviewed by pharmacy. Consider resuming nifedipine if and when clinically indicated.  --------------------------------------------- Trevor [name] is an 71 y.o. male who presents with a chief complaint of hypertension. In anticipation of discharge, pharmacy has reviewed this patient's prior to admission medication history, as well as current inpatient medications listed per the [facility].  Per notes, patient to be discharged with home health.  Time spent preparing for discharge counseling: 10 min    Thank you for allowing pharmacy to be a part of this patient's care.  Argie Ramming, PharmD Pharmacy Resident  Pager [phone] 01/26/17 3:34 PM

## 2017-01-26 NOTE — Care Management Note (Signed)
Case Management Note [name] RN, BSN Unit 2W-Case Manager [phone]  Patient Details  Name: Trevor [name] MRN: [id number] Date of Birth: 11/16/1946  Subjective/Objective:   Pt admitted with hypertensive urgency- hx of ESRD- HD-M/W/F                  Action/Plan: PTA pt lived at home- was active with [facility] for HHPT/OT will need [place] orders on discharge for resumption of HH services-PT eval recommendations for [name]-  per conversation with pt - pt wants to return home again with [facility] services- pt states that he recently was in a [name] and just returned home- does not want to go back to [name] at this time- pt also reports that he has a PCS aide that comes 4 days/week several hours per day through [employer]- Pt states that he has RW, BSC, canes, and a w/c at home- no other DME needs noted. Pt states that he uses [employer] for transportation - also has family that can provide transportation after work hours. CM will follow for [facility] resumption needs  Expected Discharge Date:      01/26/17            Expected Discharge Plan:  [place]  In-House Referral:     Discharge planning Services  CM Consult  Post Acute Care Choice:  Home Health, Resumption of Svcs/PTA Provider Choice offered to:  Patient  DME Arranged:    DME Agency:     HH Arranged:  PT, OT HH Agency:  [place]  Status of Service:  In process, will continue to follow  If discussed at Long Length of Stay Meetings, dates discussed:    Discharge Disposition: home with home health   Additional Comments:  [name], RN 01/26/2017, 10:23 AM

## 2017-01-26 NOTE — Progress Notes (Signed)
Family Medicine Teaching Service Daily Progress Note Intern Pager: [phone]  Patient name: Trevor [name] Medical record number: [id number] Date of birth: 1946-03-25 Age: 71 y.o. Gender: male  Primary Care Provider: [facility] Consultants: none Code Status: FULL   Pt Overview and Major Events to Date:  01/24/17: Admitted  Assessment and Plan: Trevor [name] is a 71 y.o. male presenting with elevated blood pressure. 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 BP 149/62  this am. HR 59. TSH 1.685. Lipid w/ low HDL 37. Trop 0.03>0.24>0.47>0.42, unreliable in setting of ESRD. Dr. Haroldine Laws agrees ok to DC (EKG baseline, QTc prolongation resolved). - telemetry - Continue home isosorbide-hydralazine 20-37.5 mg TID, Metoprolol 25 mg BID - PRN hydralazine 5 mg for SBP>180, DBP>110 - Risk strat labs: HbA1c (pending)  Chest pain and EKG changes, resolved - Likely due to demand in setting of hypertensive urgency. Initially with ST changes and QTc prolongation that have resolved. 2/25 EKG improved to baseline 12/09/16 EKG  ESRD on HD MWF, stable - Patient went for HD on Friday. Follows with Dr. Marval Regal at [place]. HD since 2014. - continue home phoslo - HD 2/26  CAD/HLD, stable - Lipid panel on admission Chol 135, Triglycerides 119, HDL 37, LDL 74.  - continue home lipitor 40 mg QD, ASA 81 mg QD, plavix 74 mg QD  Afib, paroxysmal - currently in NSR per ECG from admission - continue home metoprolol tartrate 25 mg BID - continue home ASA, Plavix - telemetry  CHF, stable  - low suspicion for exacerbation, patient appears euvolemic. EF 45-50% with moderate LVH and grade 1 DD by echo Jan 2017.  BNP 1608.5 (appears to be similar to previous).  Dry weight  131# per renal note in 12/2016.  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 - stabilize BP  FEN/GI: SLIV, renal/carb mod diet Prophylaxis: SCDs  Disposition: [name] if [name] days available - pending CSW note  Subjective:  Patient feeling well this morning. He denies having any chest pain or dyspnea nausea or vomiting. Asked patient if you would be interested in [name] after discharge which was recommended by PT/OT however he is unsure. Like to see if it is covered before making a decision.  Objective: Temp:  [98.4 F (36.9 C)] 98.4 F (36.9 C) (02/26 0425) Pulse Rate:  [60-66] 63 (02/26 0425) Resp:  [18] 18 (02/26 0425) BP: (135-165)/(53-63) 149/62 (02/26 0425) SpO2:  [100 %] 100 % (02/26 0425) Weight:  [133 lb 6.1 oz (60.5 kg)] 133 lb 6.1 oz (60.5 kg) (02/26 0425) Physical Exam: General: awake, alert, sitting at the sink doing a sponge bath, RN at bedside Cardiovascular: RRR, no murmurs Respiratory: CTA nil, normal WOB on room air Abdomen: Nondistended, nontender Extremities: No edema lower extremities bilaterally Neuro: follows commands, no focal deficits Psych: mood stable, speech normal.  Laboratory: TSH 1.685.  Lipid w/ low HDL 37.   Recent Labs Lab 01/24/17 1935 01/25/17 0714  WBC 4.8 4.1  HGB 12.0* 11.0*  HCT 36.3* 33.4*  PLT 168 152    Recent Labs Lab 01/24/17 1935  01/25/17 0714  NA 140 142  K 4.0 4.2  CL 98* 103  CO2 27 29  BUN 19 22*  CREATININE 6.79* 7.76*  CALCIUM 9.9 9.6  PROT 7.0  --   BILITOT 0.5  --   ALKPHOS 78  --   ALT 8*  --   AST 15  --   GLUCOSE 95 75    Cardiac Panel (last 3 results)  Recent Labs  01/25/17 0059 01/25/17 0714 01/25/17 1148  TROPONINI 0.24* 0.47* 0.42*   Lipid Panel     Component Value Date/Time   CHOL 135 01/24/2017 1935    TRIG 119 01/24/2017 1935   HDL 37 (L) 01/24/2017 1935   CHOLHDL 3.6 01/24/2017 1935   VLDL 24 01/24/2017 1935   [place] 74 01/24/2017 1935   Imaging/Diagnostic Tests: 2/24 ECG by EMS showed ST elevation in VR with ST depression in V3-V6 and inferior leads.   2/24 Repeat ECG in ED showed resolution of prior ST changes with LVH and repol similar to baseline ECG, QTc prolongation.  2/25 ECG  - QTc resolved, similar to 12/09/2016 EKG  Everrett Coombe, MD 01/26/2017, 9:03 AM PGY-1, [facility] Intern pager: [phone], text pages welcome

## 2017-01-27 NOTE — Procedures (Addendum)
  I was present at this dialysis session, have reviewed the session itself and made  appropriate changes Kelly Splinter MD [place] pager [phone]   01/26/2017, 4:05 PM

## 2017-01-29 DIAGNOSIS — I69398 Other sequelae of cerebral infarction: Secondary | ICD-10-CM | POA: Diagnosis not present

## 2017-01-29 DIAGNOSIS — N186 End stage renal disease: Secondary | ICD-10-CM | POA: Diagnosis not present

## 2017-01-29 DIAGNOSIS — H548 Legal blindness, as defined in USA: Secondary | ICD-10-CM | POA: Diagnosis not present

## 2017-01-29 DIAGNOSIS — I5040 Unspecified combined systolic (congestive) and diastolic (congestive) heart failure: Secondary | ICD-10-CM | POA: Diagnosis not present

## 2017-01-29 DIAGNOSIS — I132 Hypertensive heart and chronic kidney disease with heart failure and with stage 5 chronic kidney disease, or end stage renal disease: Secondary | ICD-10-CM | POA: Diagnosis not present

## 2017-01-29 DIAGNOSIS — I2510 Atherosclerotic heart disease of native coronary artery without angina pectoris: Secondary | ICD-10-CM | POA: Diagnosis not present

## 2017-02-02 ENCOUNTER — Telehealth: Payer: Self-pay | Admitting: Cardiology

## 2017-02-02 DIAGNOSIS — N186 End stage renal disease: Secondary | ICD-10-CM | POA: Diagnosis not present

## 2017-02-02 DIAGNOSIS — I69398 Other sequelae of cerebral infarction: Secondary | ICD-10-CM | POA: Diagnosis not present

## 2017-02-02 DIAGNOSIS — H548 Legal blindness, as defined in USA: Secondary | ICD-10-CM | POA: Diagnosis not present

## 2017-02-02 DIAGNOSIS — I5040 Unspecified combined systolic (congestive) and diastolic (congestive) heart failure: Secondary | ICD-10-CM | POA: Diagnosis not present

## 2017-02-02 DIAGNOSIS — I132 Hypertensive heart and chronic kidney disease with heart failure and with stage 5 chronic kidney disease, or end stage renal disease: Secondary | ICD-10-CM | POA: Diagnosis not present

## 2017-02-02 DIAGNOSIS — I2510 Atherosclerotic heart disease of native coronary artery without angina pectoris: Secondary | ICD-10-CM | POA: Diagnosis not present

## 2017-02-02 NOTE — Telephone Encounter (Signed)
[place] for order [employer]

## 2017-02-02 NOTE — Telephone Encounter (Signed)
Rosann Auerbach, Occupational Therapist at [facility]. She is calling and is requesting extended orders for once a week for three more weeks. Rosann Auerbach also states that patient hit his rib cage on an object and it is now a little sore and he also has some congestion.  Rosann Auerbach also reported that patient BP was in the 170's/80's. If you have any questions, she can be reached at [phone]

## 2017-02-02 NOTE — Telephone Encounter (Signed)
Verbal order given  

## 2017-02-03 NOTE — Progress Notes (Deleted)
HPI: FU CAD. Carotid dopplers in Nov 2012 showed no significant stenosis bilaterally. 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 MRA January 2017 showed no carotid bifurcation disease. There is atherosclerosis in both carotid siphon regions. There is also distal disease in the left vertebral artery (50). VQ scan October 2017 normal. Echocardiogram January 2017 showed ejection fraction 45-50% and grade 1 diastolic dysfunction. Mild left atrial enlargement. Cardiac catheterization December 2017 showed 60% first diagonal and 60% circumflex. Admitted Jan 2018 and February 2018 with hypertensive urgency and chest pain. Troponin minimally elevated; felt c/w demand ischemia. Since last seen   Current Outpatient Prescriptions  Medication Sig Dispense Refill  . acetaminophen (TYLENOL) 325 MG tablet Take 2 tablets (650 mg total) by mouth every 4 (four) hours as needed for headache or mild pain.    Marland Kitchen aspirin 81 MG chewable tablet Chew 1 tablet (81 mg total) by mouth daily.    Marland Kitchen atorvastatin (LIPITOR) 40 MG tablet Take 40 mg by mouth at bedtime.    . calcium acetate (PHOSLO) 667 MG capsule Take 1,334 mg by mouth 3 (three) times daily before meals.     . clopidogrel (PLAVIX) 75 MG tablet Take 1 tablet (75 mg total) by mouth daily. 30 tablet 2  . docusate sodium (COLACE) 100 MG capsule Take 200 mg by mouth daily.     . Elbasvir-Grazoprevir (ZEPATIER) 50-100 MG TABS Take 1 tablet by mouth daily.    Marland Kitchen gabapentin (NEURONTIN) 100 MG capsule Take 1 capsule (100 mg total) by mouth 3 (three) times daily. 90 capsule 2  . isosorbide-hydrALAZINE (BIDIL) 20-37.5 MG tablet Take 1 tablet by mouth 3 (three) times daily. 90 tablet 5  . metoprolol tartrate (LOPRESSOR) 25 MG tablet Take 1 tablet (25 mg total) by mouth 2 (two) times daily. 60 tablet 5  . NIFEdipine (PROCARDIA-XL/ADALAT CC) 60 MG 24 hr tablet Take 1 tablet (60  mg total) by mouth daily.    . Nutritional Supplements (FEEDING SUPPLEMENT, NEPRO CARB STEADY,) LIQD Take 237 mLs by mouth 2 (two) times daily between meals.  0  . Omega-3 Fatty Acids (FISH OIL) 1000 MG CAPS Take 1,000 mg by mouth 2 (two) times daily.     No current facility-administered medications for this visit.      Past Medical History:  Diagnosis Date  . AAA (abdominal aortic aneurysm) ([place])   . Anxiety   . Arthritis   . Blindness   . CHF (congestive heart failure) ([place])   . 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 ESRD on hemodialysis ([place])    started HD 2014-15  . Headache(784.0)   . HTN (hypertension)   . Protein-calorie malnutrition ([place])   . Stroke [facility])     Past Surgical History:  Procedure Laterality Date  . CARDIAC CATHETERIZATION N/A 11/01/2016   Procedure: Left Heart Cath and Coronary Angiography;  Surgeon: Lorretta Harp, MD;  Location: [place] CV LAB;  Service: Cardiovascular;  Laterality: N/A;  . INSERTION OF DIALYSIS CATHETER  09/21/2012   Procedure: INSERTION OF DIALYSIS CATHETER;  Surgeon: Angelia Mould, MD;  Location: [place];  Service: Vascular;  Laterality: N/A;  Right Internal Jugular Placement  . LEFT HEART CATHETERIZATION WITH CORONARY ANGIOGRAM N/A 09/28/2012   Procedure: LEFT HEART CATHETERIZATION WITH CORONARY ANGIOGRAM;  Surgeon: Sherren Mocha, MD;  Location: [facility]  CATH LAB;  Service: Cardiovascular;  Laterality: N/A;  . LEFT HEART CATHETERIZATION WITH CORONARY ANGIOGRAM N/A 07/07/2014   Procedure: LEFT HEART CATHETERIZATION WITH CORONARY ANGIOGRAM;  Surgeon: Leonie Man, MD;  Location: [facility] CATH LAB;  Service: Cardiovascular;  Laterality: N/A;  . right hand    . TEE WITHOUT CARDIOVERSION  10/09/2011   Procedure: TRANSESOPHAGEAL ECHOCARDIOGRAM (TEE);  Surgeon: Lelon Perla, MD;  Location: [facility] ENDOSCOPY;  Service: Cardiovascular;  Laterality: N/A;    Social History   Social History  .  Marital status: Divorced    Spouse name: N/A  . Number of children: 2  . Years of education: N/A   Occupational History  . Not on file.   Social History Main Topics  . Smoking status: Current Some Day Smoker    Packs/day: 0.50    Years: 50.00    Types: Cigarettes  . Smokeless tobacco: Never Used  . Alcohol use No     Comment: Occasional  . Drug use: No  . Sexual activity: Yes   Other Topics Concern  . 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     Family History  Problem Relation Age of Onset  . Heart attack Mother     MI in her 3s  . Diabetes Mother   . Alcohol abuse Father   . Anesthesia problems Neg Hx   . Hypotension Neg Hx   . Malignant hyperthermia Neg Hx   . Pseudochol deficiency Neg Hx     ROS: no fevers or chills, productive cough, hemoptysis, dysphasia, odynophagia, melena, hematochezia, dysuria, hematuria, rash, seizure activity, orthopnea, PND, pedal edema, claudication. Remaining systems are negative.  Physical Exam: Well-developed well-nourished in no acute distress.  Skin is warm and dry.  HEENT is normal.  Neck is supple.  Chest is clear to auscultation with normal expansion.  Cardiovascular exam is regular rate and rhythm.  Abdominal exam nontender or distended. No masses palpated. Extremities show no edema. neuro grossly intact  ECG- personally reviewed  A/P  1  Kirk Ruths, MD

## 2017-02-05 DIAGNOSIS — H548 Legal blindness, as defined in USA: Secondary | ICD-10-CM | POA: Diagnosis not present

## 2017-02-05 DIAGNOSIS — I5040 Unspecified combined systolic (congestive) and diastolic (congestive) heart failure: Secondary | ICD-10-CM | POA: Diagnosis not present

## 2017-02-05 DIAGNOSIS — I69398 Other sequelae of cerebral infarction: Secondary | ICD-10-CM | POA: Diagnosis not present

## 2017-02-05 DIAGNOSIS — I132 Hypertensive heart and chronic kidney disease with heart failure and with stage 5 chronic kidney disease, or end stage renal disease: Secondary | ICD-10-CM | POA: Diagnosis not present

## 2017-02-05 DIAGNOSIS — I2510 Atherosclerotic heart disease of native coronary artery without angina pectoris: Secondary | ICD-10-CM | POA: Diagnosis not present

## 2017-02-05 DIAGNOSIS — N186 End stage renal disease: Secondary | ICD-10-CM | POA: Diagnosis not present

## 2017-02-06 ENCOUNTER — Telehealth: Payer: Self-pay | Admitting: Cardiology

## 2017-02-06 NOTE — Telephone Encounter (Signed)
Home health should be managed by primary care; fu as scheduled Kirk Ruths

## 2017-02-06 NOTE — Telephone Encounter (Signed)
Trevor Malta ( [place] ) is calling in reference to Trevor [name] , that he is supposed to take medication s 4 times a day and between the 1st and 8th he supposed to take 48 dozes and he has only taken 9. So Trevor [name] is not taking his medication and she wanted to let you know . Please call if you have any questions   Thanks

## 2017-02-10 ENCOUNTER — Ambulatory Visit: Payer: Medicare Other | Admitting: Cardiology

## 2017-02-10 DIAGNOSIS — H548 Legal blindness, as defined in USA: Secondary | ICD-10-CM | POA: Diagnosis not present

## 2017-02-10 DIAGNOSIS — I2510 Atherosclerotic heart disease of native coronary artery without angina pectoris: Secondary | ICD-10-CM | POA: Diagnosis not present

## 2017-02-10 DIAGNOSIS — I5040 Unspecified combined systolic (congestive) and diastolic (congestive) heart failure: Secondary | ICD-10-CM | POA: Diagnosis not present

## 2017-02-10 DIAGNOSIS — I69398 Other sequelae of cerebral infarction: Secondary | ICD-10-CM | POA: Diagnosis not present

## 2017-02-10 DIAGNOSIS — I132 Hypertensive heart and chronic kidney disease with heart failure and with stage 5 chronic kidney disease, or end stage renal disease: Secondary | ICD-10-CM | POA: Diagnosis not present

## 2017-02-10 DIAGNOSIS — N186 End stage renal disease: Secondary | ICD-10-CM | POA: Diagnosis not present

## 2017-02-12 ENCOUNTER — Telehealth: Payer: Self-pay | Admitting: Cardiology

## 2017-02-12 DIAGNOSIS — I5040 Unspecified combined systolic (congestive) and diastolic (congestive) heart failure: Secondary | ICD-10-CM | POA: Diagnosis not present

## 2017-02-12 DIAGNOSIS — I69398 Other sequelae of cerebral infarction: Secondary | ICD-10-CM | POA: Diagnosis not present

## 2017-02-12 DIAGNOSIS — N186 End stage renal disease: Secondary | ICD-10-CM | POA: Diagnosis not present

## 2017-02-12 DIAGNOSIS — I132 Hypertensive heart and chronic kidney disease with heart failure and with stage 5 chronic kidney disease, or end stage renal disease: Secondary | ICD-10-CM | POA: Diagnosis not present

## 2017-02-12 DIAGNOSIS — H548 Legal blindness, as defined in USA: Secondary | ICD-10-CM | POA: Diagnosis not present

## 2017-02-12 DIAGNOSIS — I2510 Atherosclerotic heart disease of native coronary artery without angina pectoris: Secondary | ICD-10-CM | POA: Diagnosis not present

## 2017-02-12 NOTE — Telephone Encounter (Signed)
Trevor [name] ( [place] ) is calling to get orders to continue to see Trevor [name] for Nursing Services . Please call   Thanks

## 2017-02-12 NOTE — Telephone Encounter (Signed)
Spoke with jennifer, aware the patient will need to make a follow up appt or the orders will need to come from primary care per last telephone note.

## 2017-02-17 ENCOUNTER — Telehealth: Payer: Self-pay | Admitting: Cardiology

## 2017-02-17 NOTE — Telephone Encounter (Signed)
Returned call to [place] with [facility] [place].Dr.Crenshaw advised on 02/06/17 PCP should advise home health care.

## 2017-02-17 NOTE — Telephone Encounter (Signed)
New message    Jeannine PT from [facility] is calling to see about the status of orders for continuing physical therapy at home.

## 2017-02-19 ENCOUNTER — Telehealth: Payer: Self-pay | Admitting: Cardiology

## 2017-02-19 NOTE — Telephone Encounter (Signed)
Spoke with Rosann Auerbach and re-iterated to her per last telephone note:Spoke with jennifer, aware the patient will need to make a follow up appt or the orders will need to come from primary care, also pt cancelled his appt for evaluation for this on 02-10-17.

## 2017-02-19 NOTE — Telephone Encounter (Signed)
Please call,need orders for all patient therapy.

## 2017-04-04 ENCOUNTER — Encounter (HOSPITAL_COMMUNITY): Payer: Self-pay | Admitting: Emergency Medicine

## 2017-04-04 ENCOUNTER — Emergency Department (HOSPITAL_COMMUNITY)
Admission: EM | Admit: 2017-04-04 | Discharge: 2017-04-04 | Disposition: A | Discharge status: Home / Self Care | Payer: Medicare Other | Attending: Emergency Medicine | Admitting: Emergency Medicine

## 2017-04-04 ENCOUNTER — Emergency Department (HOSPITAL_COMMUNITY): Payer: Medicare Other

## 2017-04-04 DIAGNOSIS — N186 End stage renal disease: Secondary | ICD-10-CM | POA: Insufficient documentation

## 2017-04-04 DIAGNOSIS — Z7982 Long term (current) use of aspirin: Secondary | ICD-10-CM | POA: Diagnosis not present

## 2017-04-04 DIAGNOSIS — F17210 Nicotine dependence, cigarettes, uncomplicated: Secondary | ICD-10-CM | POA: Insufficient documentation

## 2017-04-04 DIAGNOSIS — Z992 Dependence on renal dialysis: Secondary | ICD-10-CM | POA: Diagnosis not present

## 2017-04-04 DIAGNOSIS — I132 Hypertensive heart and chronic kidney disease with heart failure and with stage 5 chronic kidney disease, or end stage renal disease: Secondary | ICD-10-CM | POA: Insufficient documentation

## 2017-04-04 DIAGNOSIS — Z8673 Personal history of transient ischemic attack (TIA), and cerebral infarction without residual deficits: Secondary | ICD-10-CM | POA: Insufficient documentation

## 2017-04-04 DIAGNOSIS — I5042 Chronic combined systolic (congestive) and diastolic (congestive) heart failure: Secondary | ICD-10-CM | POA: Diagnosis not present

## 2017-04-04 DIAGNOSIS — R0789 Other chest pain: Secondary | ICD-10-CM | POA: Insufficient documentation

## 2017-04-04 DIAGNOSIS — Z79899 Other long term (current) drug therapy: Secondary | ICD-10-CM | POA: Insufficient documentation

## 2017-04-04 DIAGNOSIS — R0602 Shortness of breath: Secondary | ICD-10-CM | POA: Diagnosis not present

## 2017-04-04 DIAGNOSIS — R1032 Left lower quadrant pain: Secondary | ICD-10-CM | POA: Diagnosis not present

## 2017-04-04 DIAGNOSIS — R079 Chest pain, unspecified: Secondary | ICD-10-CM | POA: Diagnosis not present

## 2017-04-04 LAB — URINALYSIS, ROUTINE W REFLEX MICROSCOPIC
Bilirubin Urine: NEGATIVE
GLUCOSE, UA: 50 mg/dL — AB
Ketones, ur: NEGATIVE mg/dL
LEUKOCYTES UA: NEGATIVE
NITRITE: NEGATIVE
PH: 9 — AB (ref 5.0–8.0)
Protein, ur: 100 mg/dL — AB
SPECIFIC GRAVITY, URINE: 1.008 (ref 1.005–1.030)
Squamous Epithelial / LPF: NONE SEEN

## 2017-04-04 LAB — CBC WITH DIFFERENTIAL/PLATELET
BASOS PCT: 2 %
Basophils Absolute: 0.1 10*3/uL (ref 0.0–0.1)
EOS PCT: 8 %
Eosinophils Absolute: 0.4 10*3/uL (ref 0.0–0.7)
HEMATOCRIT: 34.4 % — AB (ref 39.0–52.0)
Hemoglobin: 11.4 g/dL — ABNORMAL LOW (ref 13.0–17.0)
Lymphocytes Relative: 31 %
Lymphs Abs: 1.6 10*3/uL (ref 0.7–4.0)
MCH: 29.3 pg (ref 26.0–34.0)
MCHC: 33.1 g/dL (ref 30.0–36.0)
MCV: 88.4 fL (ref 78.0–100.0)
MONO ABS: 0.5 10*3/uL (ref 0.1–1.0)
MONOS PCT: 9 %
NEUTROS PCT: 50 %
Neutro Abs: 2.7 10*3/uL (ref 1.7–7.7)
PLATELETS: 147 10*3/uL — AB (ref 150–400)
RBC: 3.89 MIL/uL — ABNORMAL LOW (ref 4.22–5.81)
RDW: 15.7 % — AB (ref 11.5–15.5)
WBC: 5.3 10*3/uL (ref 4.0–10.5)

## 2017-04-04 LAB — I-STAT CHEM 8, ED
BUN: 20 mg/dL (ref 6–20)
CREATININE: 7 mg/dL — AB (ref 0.61–1.24)
Calcium, Ion: 1.06 mmol/L — ABNORMAL LOW (ref 1.15–1.40)
Chloride: 98 mmol/L — ABNORMAL LOW (ref 101–111)
GLUCOSE: 91 mg/dL (ref 65–99)
HCT: 35 % — ABNORMAL LOW (ref 39.0–52.0)
HEMOGLOBIN: 11.9 g/dL — AB (ref 13.0–17.0)
Potassium: 4.3 mmol/L (ref 3.5–5.1)
Sodium: 138 mmol/L (ref 135–145)
TCO2: 27 mmol/L (ref 0–100)

## 2017-04-04 LAB — RAPID URINE DRUG SCREEN, HOSP PERFORMED
Amphetamines: NOT DETECTED
BARBITURATES: NOT DETECTED
Benzodiazepines: POSITIVE — AB
Cocaine: NOT DETECTED
Opiates: NOT DETECTED
TETRAHYDROCANNABINOL: NOT DETECTED

## 2017-04-04 LAB — I-STAT TROPONIN, ED: TROPONIN I, POC: 0.05 ng/mL (ref 0.00–0.08)

## 2017-04-04 LAB — BRAIN NATRIURETIC PEPTIDE: B Natriuretic Peptide: 1151 pg/mL — ABNORMAL HIGH (ref 0.0–100.0)

## 2017-04-04 NOTE — ED Notes (Signed)
MD states okay for patient to eat/drink. Meal tray ordered and provided pt with orange juice and graham crackers.

## 2017-04-04 NOTE — Discharge Instructions (Signed)
Use Tylenol if needed for pain.  Get plenty of rest and your regular diet.

## 2017-04-04 NOTE — ED Triage Notes (Signed)
Per [facility]: Pt to ED from home c/o recurrent episode of L sided CP accompanied by SOB starting an hour ago. Pt states he's had similar episodes, last one was 3 months ago. Pt rated CP 10/10 upon EMS arrival, was given 324 ASA and 2 SL NTG with relief - reports no CP at this time. Pt reports SOB is better as well. He is now complaining of LLQ abdominal pain, sharp in nature. Pt denies N/V, but states he feels like he has to have diarrhea. Hx STEMI, HTN, CVA. Pt A&O x 4, resp e/u, skin warm/dry.

## 2017-04-04 NOTE — ED Notes (Signed)
Patient verbalized understanding of discharge instructions and denies any further needs or questions at this time. VS stable. Patient ambulatory with steady gait.  

## 2017-04-04 NOTE — ED Provider Notes (Addendum)
[facility] DEPT Provider Note   CSN: [id number] Arrival date & time: 04/04/17  1640     History   Chief Complaint Chief Complaint  Patient presents with  . Chest Pain  . Shortness of Breath    HPI Trevor [name] is a 71 y.o. male.  Patient complains of an episode of shortness of breath, with chest pain, which started when he was standing on his wheelchair changing light bulbs in a recessed ceiling canister.  He summoned EMS who treated him with aspirin and 2 sublingual nitroglycerin, with resolution of the chest discomfort.  Later he developed left lower quadrant abdominal pain, and felt like he had to have a bowel movement.  Patient has not had fever, chills, nausea or vomiting.  He has had similar chest discomfort in the past, and taken nitroglycerin, the last time, about 1 month ago.  He does not have nitroglycerin at home.  He has nonischemic cardiomyopathy.  He had a hospitalization for hypertensive urgency, 3 months ago.  He states he is taking his usual medicines and following up for his hemodialysis, as scheduled.  HPI  Past Medical History:  Diagnosis Date  . AAA (abdominal aortic aneurysm) ([place])   . Anxiety   . Arthritis   . Blindness   . CHF (congestive heart failure) ([place])   . 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 ESRD on hemodialysis ([place])    started HD 2014-15  . Headache(784.0)   . HTN (hypertension)   . Protein-calorie malnutrition ([place])   . Stroke [facility])     Patient Active Problem List   Diagnosis Date Noted  . Weakness of both legs 01/25/2017  . Hypertensive urgency 01/24/2017  . 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 Ischemic chest pain 11/01/2016  . Hypoxemia 09/07/2016  . Essential hypertension 03/08/2016  . Orthostatic hypotension 01/12/2016  . Dyslipidemia 01/12/2016  . Cough   . Weakness generalized 12/26/2015  . Chest pain 08/12/2015  . Accelerated hypertension 08/12/2015  .  Carotid stenosis   . Stroke ([place]) 04/03/2015  . Abdominal aortic aneurysm ([place]) 03/06/2015  . Generalized weakness 09/08/2014  . Acute encephalopathy 09/07/2014  . Hypertensive emergency 07/05/2014  . Protein-calorie malnutrition, severe ([place]) 05/20/2014  . Hypokalemia 05/19/2014  . Syncope and collapse 05/19/2014  . ESRD on hemodialysis ([place]) 05/19/2014  . Anemia of renal disease 05/19/2014  . Coronary artery disease, non-occlusive:  09/28/2012  . Anemia 09/27/2012  . Secondary hyperparathyroidism ([place]) 09/27/2012  . Non compliance with medical treatment 09/27/2012  . Chronic combined systolic and diastolic CHF (congestive heart failure) ([place]) 10/10/2011  . A-fib ([place]) 10/08/2011  . History of stroke 10/07/2011  . Cocaine abuse 10/07/2011  . Severe sinus bradycardia 10/07/2011  . Nonischemic cardiomyopathy ([place]) 09/18/2011  . Tobacco use disorder 08/07/2011  . Bruit 08/07/2011    Past Surgical History:  Procedure Laterality Date  . CARDIAC CATHETERIZATION N/A 11/01/2016   Procedure: Left Heart Cath and Coronary Angiography;  Surgeon: Lorretta Harp, MD;  Location: [facility] CV LAB;  Service: Cardiovascular;  Laterality: N/A;  . INSERTION OF DIALYSIS CATHETER  09/21/2012   Procedure: INSERTION OF DIALYSIS CATHETER;  Surgeon: Angelia Mould, MD;  Location: [facility];  Service: Vascular;  Laterality: N/A;  Right Internal Jugular Placement  . LEFT HEART CATHETERIZATION WITH CORONARY ANGIOGRAM N/A 09/28/2012   Procedure: LEFT HEART CATHETERIZATION WITH CORONARY ANGIOGRAM;  Surgeon: Sherren Mocha, MD;  Location: [facility] CATH LAB;  Service: Cardiovascular;  Laterality: N/A;  . LEFT HEART CATHETERIZATION WITH CORONARY ANGIOGRAM N/A 07/07/2014   Procedure: LEFT HEART CATHETERIZATION WITH CORONARY ANGIOGRAM;  Surgeon: Leonie Man, MD;  Location: [facility] CATH LAB;  Service: Cardiovascular;  Laterality: N/A;  . right hand    . TEE WITHOUT CARDIOVERSION  10/09/2011   Procedure: TRANSESOPHAGEAL  ECHOCARDIOGRAM (TEE);  Surgeon: Lelon Perla, MD;  Location: [facility] ENDOSCOPY;  Service: Cardiovascular;  Laterality: N/A;       Home Medications    Prior to Admission medications   Medication Sig Start Date End Date Taking? Authorizing Provider  acetaminophen (TYLENOL) 325 MG tablet Take 2 tablets (650 mg total) by mouth every 4 (four) hours as needed for headache or mild pain. 12/11/16   Geradine Girt, DO  aspirin 81 MG chewable tablet Chew 1 tablet (81 mg total) by mouth daily. 11/04/16   Lyda Jester M, PA-C  atorvastatin (LIPITOR) 40 MG tablet Take 40 mg by mouth at bedtime.    [provider]  calcium acetate (PHOSLO) 667 MG capsule Take 1,334 mg by mouth 3 (three) times daily before meals.     [provider]  clopidogrel (PLAVIX) 75 MG tablet Take 1 tablet (75 mg total) by mouth daily. 04/05/15   Barton Dubois, MD  docusate sodium (COLACE) 100 MG capsule Take 200 mg by mouth daily.     [provider]  Elbasvir-Grazoprevir (ZEPATIER) 50-100 MG TABS Take 1 tablet by mouth daily.    [provider]  gabapentin (NEURONTIN) 100 MG capsule Take 1 capsule (100 mg total) by mouth 3 (three) times daily. 04/05/15   Barton Dubois, MD  isosorbide-hydrALAZINE (BIDIL) 20-37.5 MG tablet Take 1 tablet by mouth 3 (three) times daily. 11/03/16   Lyda Jester M, PA-C  metoprolol tartrate (LOPRESSOR) 25 MG tablet Take 1 tablet (25 mg total) by mouth 2 (two) times daily. 11/03/16   Lyda Jester M, PA-C  NIFEdipine (PROCARDIA-XL/ADALAT CC) 60 MG 24 hr tablet Take 1 tablet (60 mg total) by mouth daily. 12/12/16   Geradine Girt, DO  Nutritional Supplements (FEEDING SUPPLEMENT, NEPRO CARB STEADY,) LIQD Take 237 mLs by mouth 2 (two) times daily between meals. 12/11/16   Geradine Girt, DO  Omega-3 Fatty Acids (FISH OIL) 1000 MG CAPS Take 1,000 mg by mouth 2 (two) times daily.    [provider]    Family History Family History  Problem Relation  Age of Onset  . Heart attack Mother     MI in her 68s  . Diabetes Mother   . Alcohol abuse Father   . Anesthesia problems Neg Hx   . Hypotension Neg Hx   . Malignant hyperthermia Neg Hx   . Pseudochol deficiency Neg Hx     Social History Social History  Substance Use Topics  . Smoking status: Current Some Day Smoker    Packs/day: 0.50    Years: 50.00    Types: Cigarettes  . Smokeless tobacco: Never Used  . Alcohol use No     Comment: Occasional     Allergies   Patient has no known allergies.   Review of Systems Review of Systems  All other systems reviewed and are negative.    Physical Exam Updated Vital Signs BP (!) 158/80   Pulse 91   Temp 98.2 F (36.8 C) (Oral)   Resp (!) 21   SpO2 99%   Physical Exam  Constitutional: He is oriented to person, place, and time. He appears  well-developed.  Elderly, frail  HENT:  Head: Normocephalic and atraumatic.  Right Ear: External ear normal.  Left Ear: External ear normal.  Eyes: Conjunctivae and EOM are normal. Pupils are equal, round, and reactive to light.  Neck: Normal range of motion and phonation normal. Neck supple.  Cardiovascular: Normal rate, regular rhythm and normal heart sounds.   Pulmonary/Chest: Effort normal and breath sounds normal. He exhibits no bony tenderness.  Abdominal: Soft. There is no tenderness.  Musculoskeletal: Normal range of motion.  Neurological: He is alert and oriented to person, place, and time. No cranial nerve deficit or sensory deficit. He exhibits normal muscle tone. Coordination normal.  No dysarthria or nystagmus.  Skin: Skin is warm, dry and intact.  Psychiatric: He has a normal mood and affect. His behavior is normal. Judgment and thought content normal.  Nursing note and vitals reviewed.    ED Treatments / Results  Labs (all labs ordered are listed, but only abnormal results are displayed) Labs Reviewed  CBC WITH DIFFERENTIAL/PLATELET - Abnormal; Notable for the  following:       Result Value   RBC 3.89 (*)    Hemoglobin 11.4 (*)    HCT 34.4 (*)    RDW 15.7 (*)    Platelets 147 (*)    All other components within normal limits  RAPID URINE DRUG SCREEN, HOSP PERFORMED - Abnormal; Notable for the following:    Benzodiazepines POSITIVE (*)    All other components within normal limits  URINALYSIS, ROUTINE W REFLEX MICROSCOPIC - Abnormal; Notable for the following:    pH 9.0 (*)    Glucose, UA 50 (*)    Hgb urine dipstick MODERATE (*)    Protein, ur 100 (*)    Bacteria, UA RARE (*)    All other components within normal limits  BRAIN NATRIURETIC PEPTIDE - Abnormal; Notable for the following:    B Natriuretic Peptide 1,151.0 (*)    All other components within normal limits  I-STAT CHEM 8, ED - Abnormal; Notable for the following:    Chloride 98 (*)    Creatinine, Ser 7.00 (*)    Calcium, Ion 1.06 (*)    Hemoglobin 11.9 (*)    HCT 35.0 (*)    All other components within normal limits  I-STAT TROPOININ, ED    EKG  EKG Interpretation  Date/Time:  Saturday Apr 04 2017 16:47:23 EDT Ventricular Rate:  86 PR Interval:    QRS Duration: 93 QT Interval:  426 QTC Calculation: 510 R Axis:   15 Text Interpretation:  Sinus rhythm Atrial premature complex Borderline repolarization abnormality Prolonged QT interval Since last tracing QT has lengthened Confirmed by Daleen Bo [phone]) on 04/04/2017 5:00:00 PM       Radiology Dg Chest Port 1 View  Result Date: 04/04/2017 CLINICAL DATA:  Left-sided chest pain and shortness of breath. EXAM: PORTABLE CHEST 1 VIEW COMPARISON:  December 07, 2016 FINDINGS: The heart size and mediastinal contours are within normal limits. Both lungs are clear. The visualized skeletal structures are unremarkable. IMPRESSION: No active disease. Electronically Signed   By: Dorise Bullion III M.D   On: 04/04/2017 17:29    Procedures Procedures (including critical care time)  Medications Ordered in ED Medications - No data  to display   Initial Impression / Assessment and Plan / ED Course  I have reviewed the triage vital signs and the nursing notes.  Pertinent labs & imaging results that were available during my care of the patient were  reviewed by me and considered in my medical decision making (see chart for details).     Medications - No data to display  Patient Vitals for the past 24 hrs:  BP Temp Temp src Pulse Resp SpO2  04/04/17 1915 (!) 158/80 - - 91 (!) 21 99 %  04/04/17 1900 (!) 150/71 - - 88 19 97 %  04/04/17 1845 (!) 142/68 - - 86 20 97 %  04/04/17 1830 118/65 - - 82 (!) 24 98 %  04/04/17 1800 (!) 151/69 - - 87 14 97 %  04/04/17 1745 131/73 - - 88 15 97 %  04/04/17 1715 (!) 157/67 - - 93 18 97 %  04/04/17 1700 (!) 163/80 - - 85 (!) 23 99 %  04/04/17 1650 (!) 155/78 98.2 F (36.8 C) Oral 92 (!) 24 100 %  04/04/17 1644 - - - - - 100 %    8:35 PM Reevaluation with update and discussion. After initial assessment and treatment, an updated evaluation reveals she feels a little dizzy now he is sitting up and eating and states that he is not having abdominal pain now.  It got better after he had a bowel movement.  Blood pressure now 80/70, repeated 103/71, while the patient is sitting and talking.  He last had dialysis, yesterday.  He does not usually make much urine.  According to nursing his in and out cath was somewhat traumatic.  Findings discussed with the patient and all questions were answered. Daleen Bo L    Final Clinical Impressions(s) / ED Diagnoses   Final diagnoses:  Chest pain, unspecified type  Left lower quadrant pain   Nonspecific transient chest pain, with negative evaluation for ACS.  Patient does not have coronary disease.  He has nonischemic cardiomyopathy, and has had multiple episodes of hypertensive urgency.  He last had cardiac catheterization, 6 months ago.  At that time it was stable from 2 years prior.  Abdominal pain resolved after bowel movement in the  emergency department.  Doubt urinary tract infection, metabolic instability, or impending vascular collapse.  Patient has had somewhat labile blood pressure in the past with recent hypertensive urgency.  It does not appear that he should have blood pressure medication changes at this time.  Nursing Notes Reviewed/ Care Coordinated Applicable Imaging Reviewed Interpretation of Laboratory Data incorporated into ED treatment  The patient appears reasonably screened and/or stabilized for discharge and I doubt any other medical condition or other [facility] requiring further screening, evaluation, or treatment in the ED at this time prior to discharge.  Plan: Home Medications-continue usual; Home Treatments-rest, fluids; return here if the recommended treatment, does not improve the symptoms; Recommended follow up-PCP checkup 1 week and as needed.    New Prescriptions New Prescriptions   No medications on file     Daleen Bo, MD 04/04/17 2116    Daleen Bo, MD 04/18/17 [phone]

## 2017-06-12 ENCOUNTER — Emergency Department (HOSPITAL_COMMUNITY)
Admission: EM | Admit: 2017-06-12 | Discharge: 2017-06-12 | Disposition: A | Discharge status: Home / Self Care | Payer: Medicare Other | Attending: Emergency Medicine | Admitting: Emergency Medicine

## 2017-06-12 ENCOUNTER — Encounter (HOSPITAL_COMMUNITY): Payer: Self-pay | Admitting: Emergency Medicine

## 2017-06-12 DIAGNOSIS — F17210 Nicotine dependence, cigarettes, uncomplicated: Secondary | ICD-10-CM | POA: Diagnosis not present

## 2017-06-12 DIAGNOSIS — T83498A Other mechanical complication of other prosthetic devices, implants and grafts of genital tract, initial encounter: Secondary | ICD-10-CM | POA: Diagnosis not present

## 2017-06-12 DIAGNOSIS — T82838A Hemorrhage of vascular prosthetic devices, implants and grafts, initial encounter: Secondary | ICD-10-CM | POA: Diagnosis not present

## 2017-06-12 DIAGNOSIS — Z992 Dependence on renal dialysis: Secondary | ICD-10-CM | POA: Diagnosis not present

## 2017-06-12 DIAGNOSIS — Z7902 Long term (current) use of antithrombotics/antiplatelets: Secondary | ICD-10-CM | POA: Insufficient documentation

## 2017-06-12 DIAGNOSIS — R58 Hemorrhage, not elsewhere classified: Secondary | ICD-10-CM

## 2017-06-12 DIAGNOSIS — Z79899 Other long term (current) drug therapy: Secondary | ICD-10-CM | POA: Insufficient documentation

## 2017-06-12 DIAGNOSIS — Z8673 Personal history of transient ischemic attack (TIA), and cerebral infarction without residual deficits: Secondary | ICD-10-CM | POA: Insufficient documentation

## 2017-06-12 DIAGNOSIS — I132 Hypertensive heart and chronic kidney disease with heart failure and with stage 5 chronic kidney disease, or end stage renal disease: Secondary | ICD-10-CM | POA: Insufficient documentation

## 2017-06-12 DIAGNOSIS — Y828 Other medical devices associated with adverse incidents: Secondary | ICD-10-CM | POA: Diagnosis not present

## 2017-06-12 DIAGNOSIS — Z7982 Long term (current) use of aspirin: Secondary | ICD-10-CM | POA: Insufficient documentation

## 2017-06-12 DIAGNOSIS — I5042 Chronic combined systolic (congestive) and diastolic (congestive) heart failure: Secondary | ICD-10-CM | POA: Insufficient documentation

## 2017-06-12 DIAGNOSIS — N186 End stage renal disease: Secondary | ICD-10-CM | POA: Insufficient documentation

## 2017-06-12 DIAGNOSIS — T83198A Other mechanical complication of other urinary devices and implants, initial encounter: Secondary | ICD-10-CM | POA: Diagnosis not present

## 2017-06-12 NOTE — ED Provider Notes (Signed)
[facility] DEPT Provider Note   CSN: [id number] Arrival date & time: 06/12/17  1750   By signing my name below, I, Eunice Blase, attest that this documentation has been prepared under the direction and in the presence of Dorie Rank, MD. Electronically signed, Eunice Blase, ED Scribe. 06/12/17. 6:12 PM.   History   Chief Complaint Chief Complaint  Patient presents with  . Hemorrhage   The history is provided by the patient, medical records and the EMS personnel. No language interpreter was used.    Trevor [name] is a 71 y.o. male on dialysis treatment BIB EMS to the Emergency Department from dialysis treatment concerning persistent bleeding following dialysis treatment. EMS notes persistent bleeding from 2 puncture wounds on the L arm near pt's AV fistula. Bleeding controlled on evaluation with sterile gauze; no active bleeding upon inspection. Pt states this has occurred in the past, thought the bleeding was not as persistent as this episode. No complications noted during dialysis today. MRI of the abdomen yesterday at the [facility] in [place] noted. No other complaints at this time.   Past Medical History:  Diagnosis Date  . AAA (abdominal aortic aneurysm) ([place])   . Anxiety   . Arthritis   . Blindness   . CHF (congestive heart failure) ([place])   . 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 ESRD on hemodialysis ([place])    started HD 2014-15  . Headache(784.0)   . HTN (hypertension)   . Protein-calorie malnutrition ([place])   . Stroke [facility])     Patient Active Problem List   Diagnosis Date Noted  . Weakness of both legs 01/25/2017  . Hypertensive urgency 01/24/2017  . 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 Ischemic chest pain 11/01/2016  . Hypoxemia 09/07/2016  . Essential hypertension 03/08/2016  . Orthostatic hypotension 01/12/2016  . Dyslipidemia 01/12/2016  . Cough   . Weakness generalized 12/26/2015  . Chest  pain 08/12/2015  . Accelerated hypertension 08/12/2015  . Carotid stenosis   . Stroke ([place]) 04/03/2015  . Abdominal aortic aneurysm ([place]) 03/06/2015  . Generalized weakness 09/08/2014  . Acute encephalopathy 09/07/2014  . Hypertensive emergency 07/05/2014  . Protein-calorie malnutrition, severe ([place]) 05/20/2014  . Hypokalemia 05/19/2014  . Syncope and collapse 05/19/2014  . ESRD on hemodialysis ([place]) 05/19/2014  . Anemia of renal disease 05/19/2014  . Coronary artery disease, non-occlusive:  09/28/2012  . Anemia 09/27/2012  . Secondary hyperparathyroidism ([place]) 09/27/2012  . Non compliance with medical treatment 09/27/2012  . Chronic combined systolic and diastolic CHF (congestive heart failure) ([place]) 10/10/2011  . A-fib ([place]) 10/08/2011  . History of stroke 10/07/2011  . Cocaine abuse 10/07/2011  . Severe sinus bradycardia 10/07/2011  . Nonischemic cardiomyopathy ([place]) 09/18/2011  . Tobacco use disorder 08/07/2011  . Bruit 08/07/2011    Past Surgical History:  Procedure Laterality Date  . CARDIAC CATHETERIZATION N/A 11/01/2016   Procedure: Left Heart Cath and Coronary Angiography;  Surgeon: Lorretta Harp, MD;  Location: [facility] CV LAB;  Service: Cardiovascular;  Laterality: N/A;  . INSERTION OF DIALYSIS CATHETER  09/21/2012   Procedure: INSERTION OF DIALYSIS CATHETER;  Surgeon: Angelia Mould, MD;  Location: [facility];  Service: Vascular;  Laterality: N/A;  Right Internal Jugular Placement  . LEFT HEART CATHETERIZATION WITH CORONARY ANGIOGRAM N/A 09/28/2012   Procedure: LEFT HEART CATHETERIZATION WITH CORONARY ANGIOGRAM;  Surgeon: Sherren Mocha, MD;  Location: [facility] CATH LAB;  Service: Cardiovascular;  Laterality:  N/A;  . LEFT HEART CATHETERIZATION WITH CORONARY ANGIOGRAM N/A 07/07/2014   Procedure: LEFT HEART CATHETERIZATION WITH CORONARY ANGIOGRAM;  Surgeon: Leonie Man, MD;  Location: [facility] CATH LAB;  Service: Cardiovascular;  Laterality: N/A;  . right hand    . TEE  WITHOUT CARDIOVERSION  10/09/2011   Procedure: TRANSESOPHAGEAL ECHOCARDIOGRAM (TEE);  Surgeon: Lelon Perla, MD;  Location: [facility] ENDOSCOPY;  Service: Cardiovascular;  Laterality: N/A;       Home Medications    Prior to Admission medications   Medication Sig Start Date End Date Taking? Authorizing Provider  acetaminophen (TYLENOL) 325 MG tablet Take 2 tablets (650 mg total) by mouth every 4 (four) hours as needed for headache or mild pain. 12/11/16  Yes Vann, Jessica U, DO  amLODipine (NORVASC) 10 MG tablet Take 10 mg by mouth every evening. 04/13/17  Yes [provider]  aspirin 81 MG chewable tablet Chew 1 tablet (81 mg total) by mouth daily. 11/04/16  Yes Lyda Jester M, PA-C  atorvastatin (LIPITOR) 40 MG tablet Take 40 mg by mouth at bedtime.   Yes [provider]  calcium acetate (PHOSLO) 667 MG capsule Take 1,334 mg by mouth 3 (three) times daily before meals.    Yes [provider]  clopidogrel (PLAVIX) 75 MG tablet Take 1 tablet (75 mg total) by mouth daily. 04/05/15  Yes Barton Dubois, MD  docusate sodium (COLACE) 100 MG capsule Take 200 mg by mouth daily.    Yes [provider]  gabapentin (NEURONTIN) 100 MG capsule Take 1 capsule (100 mg total) by mouth 3 (three) times daily. 04/05/15  Yes Barton Dubois, MD  isosorbide-hydrALAZINE (BIDIL) 20-37.5 MG tablet Take 1 tablet by mouth 3 (three) times daily. 11/03/16  Yes Lyda Jester M, PA-C  metoprolol tartrate (LOPRESSOR) 25 MG tablet Take 1 tablet (25 mg total) by mouth 2 (two) times daily. 11/03/16  Yes Lyda Jester M, PA-C  NIFEdipine (PROCARDIA-XL/ADALAT CC) 60 MG 24 hr tablet Take 1 tablet (60 mg total) by mouth daily. 12/12/16  Yes Vann, Tomi Bamberger, DO  Omega-3 Fatty Acids (FISH OIL) 1000 MG CAPS Take 1,000 mg by mouth 2 (two) times daily.   Yes [provider]  Elbasvir-Grazoprevir (ZEPATIER) 50-100 MG TABS Take 1 tablet by mouth daily.    [provider]    Nutritional Supplements (FEEDING SUPPLEMENT, NEPRO CARB STEADY,) LIQD Take 237 mLs by mouth 2 (two) times daily between meals. 12/11/16   Geradine Girt, DO    Family History Family History  Problem Relation Age of Onset  . Heart attack Mother        MI in her 28s  . Diabetes Mother   . Alcohol abuse Father   . Anesthesia problems Neg Hx   . Hypotension Neg Hx   . Malignant hyperthermia Neg Hx   . Pseudochol deficiency Neg Hx     Social History Social History  Substance Use Topics  . Smoking status: Current Some Day Smoker    Packs/day: 0.50    Years: 50.00    Types: Cigarettes  . Smokeless tobacco: Never Used  . Alcohol use No     Comment: Occasional     Allergies   Patient has no known allergies.   Review of Systems Review of Systems  Constitutional: Negative for fatigue.  Skin: Positive for wound.  Neurological: Negative for dizziness, weakness and numbness.  All other systems reviewed and are negative.    Physical Exam Updated Vital Signs BP (!) 141/81 (  BP Location: Right Arm)   Pulse (!) 50   Temp 97.9 F (36.6 C) (Oral)   Resp 16   Ht 5\' 10"  (1.778 m)   Wt 131 lb 6.3 oz (59.6 kg)   SpO2 97%   BMI 18.85 kg/m   Physical Exam  Constitutional: He appears well-developed and well-nourished. No distress.  HENT:  Head: Normocephalic and atraumatic.  Right Ear: External ear normal.  Left Ear: External ear normal.  Eyes: Conjunctivae are normal. Right eye exhibits no discharge. Left eye exhibits no discharge. No scleral icterus.  Neck: Neck supple. No tracheal deviation present.  Cardiovascular: Normal rate, regular rhythm and intact distal pulses.   Pulmonary/Chest: Effort normal and breath sounds normal. No stridor. No respiratory distress. He has no wheezes. He has no rales.  Abdominal: Soft. Bowel sounds are normal. He exhibits no distension. There is no tenderness. There is no rebound and no guarding.  Musculoskeletal: He exhibits no edema or  tenderness.  AV fistula RUE.   Neurological: He is alert. He has normal strength. No cranial nerve deficit (no facial droop, extraocular movements intact, no slurred speech) or sensory deficit. He exhibits normal muscle tone. He displays no seizure activity. Coordination normal.  Skin: Skin is warm and dry. No rash noted.  Gauze removed. 2 small puncture wounds, no active bleeding.  Psychiatric: He has a normal mood and affect.  Nursing note and vitals reviewed.    ED Treatments / Results  DIAGNOSTIC STUDIES: Oxygen Saturation is 97% on RA, NL by my interpretation.   COORDINATION OF CARE: 5:58 PM-Discussed next steps with pt. Pt verbalized understanding and is agreeable with the plan. Will observe pt and reassess.   Labs (all labs ordered are listed, but only abnormal results are displayed) Labs Reviewed  CBC  BASIC METABOLIC PANEL    Radiology No results found.  Procedures Procedures (including critical care time)  Medications Ordered in ED Medications - No data to display   Initial Impression / Assessment and Plan / ED Course  I have reviewed the triage vital signs and the nursing notes.  Pertinent labs & imaging results that were available during my care of the patient were reviewed by me and considered in my medical decision making (see chart for details).   Pt was monitored in the ED.  No further bleeding.  Labs were ordered initially but not drawn.  Pt is ready to leave at this point.  He does not feel lightheaded.  Stable for discharge.  Final Clinical Impressions(s) / ED Diagnoses   Final diagnoses:  Bleeding    I personally performed the services described in this documentation, which was scribed in my presence.  The recorded information has been reviewed and is accurate.    Dorie Rank, MD 06/12/17 2017

## 2017-06-12 NOTE — Discharge Instructions (Signed)
Follow-up with your dialysis as planned, return for any worsening symptoms

## 2017-06-12 NOTE — ED Triage Notes (Signed)
Pt was at dialysis and finished his treatments. Upon finishing same they removed the pt from the machine and could not get the bleeding stopped. Dialysis center called 9-1-1 to have pt transported to the ED. Pt has no complaints of pain. Bleeding controlled upon arrival at the ED.

## 2017-06-24 DIAGNOSIS — R0602 Shortness of breath: Secondary | ICD-10-CM | POA: Diagnosis not present

## 2017-06-27 ENCOUNTER — Non-Acute Institutional Stay (HOSPITAL_COMMUNITY)
Admission: EM | Admit: 2017-06-27 | Discharge: 2017-06-27 | Disposition: A | Discharge status: Still In Hospital | Payer: Medicare Other | Source: Home / Self Care | Attending: Physician Assistant | Admitting: Physician Assistant

## 2017-06-27 ENCOUNTER — Emergency Department (HOSPITAL_COMMUNITY): Payer: Medicare Other

## 2017-06-27 ENCOUNTER — Encounter (HOSPITAL_COMMUNITY): Payer: Self-pay

## 2017-06-27 ENCOUNTER — Emergency Department (HOSPITAL_COMMUNITY)
Admission: EM | Admit: 2017-06-27 | Discharge: 2017-06-27 | Disposition: A | Discharge status: Home / Self Care | Payer: Medicare Other | Source: Home / Self Care | Attending: Emergency Medicine | Admitting: Emergency Medicine

## 2017-06-27 DIAGNOSIS — I132 Hypertensive heart and chronic kidney disease with heart failure and with stage 5 chronic kidney disease, or end stage renal disease: Secondary | ICD-10-CM | POA: Insufficient documentation

## 2017-06-27 DIAGNOSIS — I2510 Atherosclerotic heart disease of native coronary artery without angina pectoris: Secondary | ICD-10-CM

## 2017-06-27 DIAGNOSIS — I998 Other disorder of circulatory system: Secondary | ICD-10-CM | POA: Diagnosis not present

## 2017-06-27 DIAGNOSIS — N186 End stage renal disease: Secondary | ICD-10-CM

## 2017-06-27 DIAGNOSIS — F17210 Nicotine dependence, cigarettes, uncomplicated: Secondary | ICD-10-CM

## 2017-06-27 DIAGNOSIS — E43 Unspecified severe protein-calorie malnutrition: Secondary | ICD-10-CM

## 2017-06-27 DIAGNOSIS — J810 Acute pulmonary edema: Secondary | ICD-10-CM | POA: Diagnosis present

## 2017-06-27 DIAGNOSIS — R0603 Acute respiratory distress: Secondary | ICD-10-CM

## 2017-06-27 DIAGNOSIS — R069 Unspecified abnormalities of breathing: Secondary | ICD-10-CM | POA: Diagnosis not present

## 2017-06-27 DIAGNOSIS — R0902 Hypoxemia: Secondary | ICD-10-CM | POA: Insufficient documentation

## 2017-06-27 DIAGNOSIS — Z992 Dependence on renal dialysis: Secondary | ICD-10-CM

## 2017-06-27 DIAGNOSIS — N2581 Secondary hyperparathyroidism of renal origin: Secondary | ICD-10-CM

## 2017-06-27 DIAGNOSIS — Z8249 Family history of ischemic heart disease and other diseases of the circulatory system: Secondary | ICD-10-CM

## 2017-06-27 DIAGNOSIS — I252 Old myocardial infarction: Secondary | ICD-10-CM

## 2017-06-27 DIAGNOSIS — Z7982 Long term (current) use of aspirin: Secondary | ICD-10-CM

## 2017-06-27 DIAGNOSIS — T730XXA Starvation, initial encounter: Secondary | ICD-10-CM | POA: Insufficient documentation

## 2017-06-27 DIAGNOSIS — I6529 Occlusion and stenosis of unspecified carotid artery: Secondary | ICD-10-CM

## 2017-06-27 DIAGNOSIS — Z7902 Long term (current) use of antithrombotics/antiplatelets: Secondary | ICD-10-CM

## 2017-06-27 DIAGNOSIS — Z833 Family history of diabetes mellitus: Secondary | ICD-10-CM | POA: Insufficient documentation

## 2017-06-27 DIAGNOSIS — M62838 Other muscle spasm: Secondary | ICD-10-CM | POA: Insufficient documentation

## 2017-06-27 DIAGNOSIS — E785 Hyperlipidemia, unspecified: Secondary | ICD-10-CM | POA: Insufficient documentation

## 2017-06-27 DIAGNOSIS — J90 Pleural effusion, not elsewhere classified: Secondary | ICD-10-CM | POA: Diagnosis not present

## 2017-06-27 DIAGNOSIS — Z79899 Other long term (current) drug therapy: Secondary | ICD-10-CM

## 2017-06-27 DIAGNOSIS — Z811 Family history of alcohol abuse and dependence: Secondary | ICD-10-CM | POA: Insufficient documentation

## 2017-06-27 DIAGNOSIS — R609 Edema, unspecified: Secondary | ICD-10-CM | POA: Diagnosis not present

## 2017-06-27 DIAGNOSIS — E861 Hypovolemia: Secondary | ICD-10-CM | POA: Diagnosis not present

## 2017-06-27 DIAGNOSIS — I480 Paroxysmal atrial fibrillation: Secondary | ICD-10-CM

## 2017-06-27 DIAGNOSIS — I5042 Chronic combined systolic (congestive) and diastolic (congestive) heart failure: Secondary | ICD-10-CM

## 2017-06-27 DIAGNOSIS — I714 Abdominal aortic aneurysm, without rupture: Secondary | ICD-10-CM | POA: Insufficient documentation

## 2017-06-27 DIAGNOSIS — Z8673 Personal history of transient ischemic attack (TIA), and cerebral infarction without residual deficits: Secondary | ICD-10-CM

## 2017-06-27 DIAGNOSIS — E8779 Other fluid overload: Secondary | ICD-10-CM

## 2017-06-27 DIAGNOSIS — Z9889 Other specified postprocedural states: Secondary | ICD-10-CM

## 2017-06-27 DIAGNOSIS — R0789 Other chest pain: Secondary | ICD-10-CM | POA: Diagnosis not present

## 2017-06-27 DIAGNOSIS — I429 Cardiomyopathy, unspecified: Secondary | ICD-10-CM

## 2017-06-27 DIAGNOSIS — D631 Anemia in chronic kidney disease: Secondary | ICD-10-CM | POA: Insufficient documentation

## 2017-06-27 DIAGNOSIS — N429 Disorder of prostate, unspecified: Secondary | ICD-10-CM | POA: Diagnosis not present

## 2017-06-27 DIAGNOSIS — I743 Embolism and thrombosis of arteries of the lower extremities: Secondary | ICD-10-CM | POA: Diagnosis not present

## 2017-06-27 LAB — CBC WITH DIFFERENTIAL/PLATELET
BASOS ABS: 0 10*3/uL (ref 0.0–0.1)
BASOS PCT: 1 %
Basophils Absolute: 0.1 10*3/uL (ref 0.0–0.1)
Basophils Relative: 1 %
Eosinophils Absolute: 0.3 10*3/uL (ref 0.0–0.7)
Eosinophils Absolute: 0.1 10*3/uL (ref 0.0–0.7)
Eosinophils Relative: 6 %
Eosinophils Relative: 4 %
HCT: 27.7 % — ABNORMAL LOW (ref 39.0–52.0)
HEMATOCRIT: 26.5 % — AB (ref 39.0–52.0)
HEMOGLOBIN: 9.1 g/dL — AB (ref 13.0–17.0)
Hemoglobin: 8.8 g/dL — ABNORMAL LOW (ref 13.0–17.0)
LYMPHS ABS: 1.1 10*3/uL (ref 0.7–4.0)
LYMPHS PCT: 22 %
Lymphocytes Relative: 19 %
Lymphs Abs: 0.8 10*3/uL (ref 0.7–4.0)
MCH: 29 pg (ref 26.0–34.0)
MCH: 29.1 pg (ref 26.0–34.0)
MCHC: 32.9 g/dL (ref 30.0–36.0)
MCHC: 33.2 g/dL (ref 30.0–36.0)
MCV: 88.2 fL (ref 78.0–100.0)
MCV: 87.7 fL (ref 78.0–100.0)
MONO ABS: 0.3 10*3/uL (ref 0.1–1.0)
MONO ABS: 0.4 10*3/uL (ref 0.1–1.0)
MONOS PCT: 6 %
Monocytes Relative: 11 %
NEUTROS ABS: 2.3 10*3/uL (ref 1.7–7.7)
NEUTROS PCT: 68 %
NEUTROS PCT: 62 %
Neutro Abs: 3.9 10*3/uL (ref 1.7–7.7)
PLATELETS: 127 10*3/uL — AB (ref 150–400)
Platelets: 152 10*3/uL (ref 150–400)
RBC: 3.14 MIL/uL — ABNORMAL LOW (ref 4.22–5.81)
RBC: 3.02 MIL/uL — AB (ref 4.22–5.81)
RDW: 14.1 % (ref 11.5–15.5)
RDW: 14 % (ref 11.5–15.5)
WBC: 5.7 10*3/uL (ref 4.0–10.5)
WBC: 3.7 10*3/uL — AB (ref 4.0–10.5)

## 2017-06-27 LAB — COMPREHENSIVE METABOLIC PANEL
ALT: 7 U/L — AB (ref 17–63)
ALT: 6 U/L — AB (ref 17–63)
AST: 17 U/L (ref 15–41)
AST: 13 U/L — AB (ref 15–41)
Albumin: 3.1 g/dL — ABNORMAL LOW (ref 3.5–5.0)
Albumin: 3.2 g/dL — ABNORMAL LOW (ref 3.5–5.0)
Alkaline Phosphatase: 72 U/L (ref 38–126)
Alkaline Phosphatase: 66 U/L (ref 38–126)
Anion gap: 13 (ref 5–15)
Anion gap: 9 (ref 5–15)
BILIRUBIN TOTAL: 0.9 mg/dL (ref 0.3–1.2)
BUN: 15 mg/dL (ref 6–20)
BUN: <5 mg/dL — ABNORMAL LOW (ref 6–20)
CALCIUM: 8.4 mg/dL — AB (ref 8.9–10.3)
CALCIUM: 8.8 mg/dL — AB (ref 8.9–10.3)
CHLORIDE: 100 mmol/L — AB (ref 101–111)
CO2: 28 mmol/L (ref 22–32)
CO2: 30 mmol/L (ref 22–32)
CREATININE: 6.14 mg/dL — AB (ref 0.61–1.24)
Chloride: 97 mmol/L — ABNORMAL LOW (ref 101–111)
Creatinine, Ser: 2.98 mg/dL — ABNORMAL HIGH (ref 0.61–1.24)
GFR calc Af Amer: 10 mL/min — ABNORMAL LOW (ref >60)
GFR calc Af Amer: 23 mL/min — ABNORMAL LOW (ref >60)
GFR, EST NON AFRICAN AMERICAN: 8 mL/min — AB (ref >60)
GFR, EST NON AFRICAN AMERICAN: 20 mL/min — AB (ref >60)
GLUCOSE: 132 mg/dL — AB (ref 65–99)
Glucose, Bld: 88 mg/dL (ref 65–99)
POTASSIUM: 3.4 mmol/L — AB (ref 3.5–5.1)
Potassium: 3.2 mmol/L — ABNORMAL LOW (ref 3.5–5.1)
Sodium: 141 mmol/L (ref 135–145)
Sodium: 136 mmol/L (ref 135–145)
TOTAL PROTEIN: 6.7 g/dL (ref 6.5–8.1)
Total Bilirubin: 0.8 mg/dL (ref 0.3–1.2)
Total Protein: 6.4 g/dL — ABNORMAL LOW (ref 6.5–8.1)

## 2017-06-27 LAB — I-STAT TROPONIN, ED: TROPONIN I, POC: 0.02 ng/mL (ref 0.00–0.08)

## 2017-06-27 LAB — I-STAT ARTERIAL BLOOD GAS, ED
Acid-Base Excess: 11 mmol/L — ABNORMAL HIGH (ref 0.0–2.0)
Bicarbonate: 34 mmol/L — ABNORMAL HIGH (ref 20.0–28.0)
O2 Saturation: 100 %
PCO2 ART: 39.3 mmHg (ref 32.0–48.0)
PH ART: 7.544 — AB (ref 7.350–7.450)
PO2 ART: 209 mmHg — AB (ref 83.0–108.0)
Patient temperature: 98
TCO2: 35 mmol/L (ref 0–100)

## 2017-06-27 LAB — I-STAT CG4 LACTIC ACID, ED: Lactic Acid, Venous: 2.19 mmol/L (ref 0.5–1.9)

## 2017-06-27 LAB — BRAIN NATRIURETIC PEPTIDE: B Natriuretic Peptide: >4500 pg/mL — ABNORMAL HIGH (ref 0.0–100.0)

## 2017-06-27 NOTE — ED Notes (Signed)
Pt given sandwich and drink.

## 2017-06-27 NOTE — ED Provider Notes (Signed)
[facility] DEPT Provider Note   CSN: [id number] Arrival date & time: 06/27/17  1229     History   Chief Complaint Chief Complaint  Patient presents with  . Spasms    HPI Trevor [name] is a 71 y.o. male.  Trevor [name] is a 71 y.o. Male with ESRD on dialysis MWF who presents the emergency department saying that he feels fatigued and hungry. Patient also reports he's had some intermittent cramping of his right leg today. Patient is seen in the emergency department earlier today with shortness of breath. He was fluid overloaded and sent for dialysis. Patient was after dialysis his shortness of breath has resolved. He reports feeling better. He reports he did not feel ready to go home and he was sent to the emergency department. He tells me he is hungry and has not eaten today. He reports he lives alone at home and has an aide that comes to help him. He denies current shortness of breath. He denies current leg cramping. He denies fevers, chills, increased cough, abdominal pain, nausea, vomiting or diarrhea.      Past Medical History:  Diagnosis Date  . AAA (abdominal aortic aneurysm) ([place])   . Anxiety   . Arthritis   . Blindness   . CHF (congestive heart failure) ([place])   . 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 ESRD on hemodialysis ([place])    started HD 2014-15  . Headache(784.0)   . HTN (hypertension)   . Protein-calorie malnutrition ([place])   . Stroke [facility])     Patient Active Problem List   Diagnosis Date Noted  . Pulmonary edema 06/27/2017  . Weakness of both legs 01/25/2017  . Hypertensive urgency 01/24/2017  . 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 Ischemic chest pain 11/01/2016  . Hypoxemia 09/07/2016  . Essential hypertension 03/08/2016  . Orthostatic hypotension 01/12/2016  . Dyslipidemia 01/12/2016  . Cough   . Weakness generalized 12/26/2015  . Chest pain 08/12/2015  . Accelerated hypertension  08/12/2015  . Carotid stenosis   . Stroke ([place]) 04/03/2015  . Abdominal aortic aneurysm ([place]) 03/06/2015  . Generalized weakness 09/08/2014  . Acute encephalopathy 09/07/2014  . Hypertensive emergency 07/05/2014  . Protein-calorie malnutrition, severe ([place]) 05/20/2014  . Hypokalemia 05/19/2014  . Syncope and collapse 05/19/2014  . ESRD on hemodialysis ([place]) 05/19/2014  . Anemia of renal disease 05/19/2014  . Coronary artery disease, non-occlusive:  09/28/2012  . Anemia 09/27/2012  . Secondary hyperparathyroidism ([place]) 09/27/2012  . Non compliance with medical treatment 09/27/2012  . Chronic combined systolic and diastolic CHF (congestive heart failure) ([place]) 10/10/2011  . A-fib ([place]) 10/08/2011  . History of stroke 10/07/2011  . Cocaine abuse 10/07/2011  . Severe sinus bradycardia 10/07/2011  . Nonischemic cardiomyopathy ([place]) 09/18/2011  . Tobacco use disorder 08/07/2011  . Bruit 08/07/2011    Past Surgical History:  Procedure Laterality Date  . CARDIAC CATHETERIZATION N/A 11/01/2016   Procedure: Left Heart Cath and Coronary Angiography;  Surgeon: Lorretta Harp, MD;  Location: [facility] CV LAB;  Service: Cardiovascular;  Laterality: N/A;  . INSERTION OF DIALYSIS CATHETER  09/21/2012   Procedure: INSERTION OF DIALYSIS CATHETER;  Surgeon: Angelia Mould, MD;  Location: [facility];  Service: Vascular;  Laterality: N/A;  Right Internal Jugular Placement  . LEFT HEART CATHETERIZATION WITH CORONARY ANGIOGRAM N/A 09/28/2012   Procedure: LEFT HEART CATHETERIZATION WITH CORONARY ANGIOGRAM;  Surgeon: Sherren Mocha, MD;  Location: [facility] CATH LAB;  Service: Cardiovascular;  Laterality: N/A;  . LEFT HEART CATHETERIZATION WITH CORONARY ANGIOGRAM N/A 07/07/2014   Procedure: LEFT HEART CATHETERIZATION WITH CORONARY ANGIOGRAM;  Surgeon: Leonie Man, MD;  Location: [facility] CATH LAB;  Service: Cardiovascular;  Laterality: N/A;  . right hand    . TEE WITHOUT CARDIOVERSION  10/09/2011   Procedure:  TRANSESOPHAGEAL ECHOCARDIOGRAM (TEE);  Surgeon: Lelon Perla, MD;  Location: [facility] ENDOSCOPY;  Service: Cardiovascular;  Laterality: N/A;       Home Medications    Prior to Admission medications   Medication Sig Start Date End Date Taking? Authorizing Provider  acetaminophen (TYLENOL) 325 MG tablet Take 2 tablets (650 mg total) by mouth every 4 (four) hours as needed for headache or mild pain. 12/11/16   Geradine Girt, DO  amLODipine (NORVASC) 10 MG tablet Take 10 mg by mouth every evening. 04/13/17   [provider]  aspirin 81 MG chewable tablet Chew 1 tablet (81 mg total) by mouth daily. 11/04/16   Lyda Jester M, PA-C  atorvastatin (LIPITOR) 40 MG tablet Take 40 mg by mouth at bedtime.    [provider]  calcium acetate (PHOSLO) 667 MG capsule Take 1,334 mg by mouth 3 (three) times daily before meals.     [provider]  clopidogrel (PLAVIX) 75 MG tablet Take 1 tablet (75 mg total) by mouth daily. 04/05/15   Barton Dubois, MD  docusate sodium (COLACE) 100 MG capsule Take 200 mg by mouth daily.     [provider]  Elbasvir-Grazoprevir (ZEPATIER) 50-100 MG TABS Take 1 tablet by mouth daily.    [provider]  gabapentin (NEURONTIN) 100 MG capsule Take 1 capsule (100 mg total) by mouth 3 (three) times daily. 04/05/15   Barton Dubois, MD  isosorbide-hydrALAZINE (BIDIL) 20-37.5 MG tablet Take 1 tablet by mouth 3 (three) times daily. 11/03/16   Lyda Jester M, PA-C  metoprolol tartrate (LOPRESSOR) 25 MG tablet Take 1 tablet (25 mg total) by mouth 2 (two) times daily. 11/03/16   Lyda Jester M, PA-C  NIFEdipine (PROCARDIA-XL/ADALAT CC) 60 MG 24 hr tablet Take 1 tablet (60 mg total) by mouth daily. 12/12/16   Geradine Girt, DO  Nutritional Supplements (FEEDING SUPPLEMENT, NEPRO CARB STEADY,) LIQD Take 237 mLs by mouth 2 (two) times daily between meals. 12/11/16   Geradine Girt, DO  Omega-3 Fatty Acids (FISH OIL) 1000 MG CAPS Take  1,000 mg by mouth 2 (two) times daily.    [provider]    Family History Family History  Problem Relation Age of Onset  . Heart attack Mother        MI in her 68s  . Diabetes Mother   . Alcohol abuse Father   . Anesthesia problems Neg Hx   . Hypotension Neg Hx   . Malignant hyperthermia Neg Hx   . Pseudochol deficiency Neg Hx     Social History Social History  Substance Use Topics  . Smoking status: Current Some Day Smoker    Packs/day: 0.50    Years: 50.00    Types: Cigarettes  . Smokeless tobacco: Never Used  . Alcohol use No     Comment: Occasional     Allergies   Patient has no known allergies.   Review of Systems Review of Systems  Constitutional: Positive for fatigue. Negative for chills and fever.  HENT: Negative for congestion and sore throat.   Eyes: Negative for visual disturbance.  Respiratory: Negative for  cough and shortness of breath.   Cardiovascular: Negative for chest pain.  Gastrointestinal: Negative for abdominal pain, diarrhea, nausea and vomiting.  Genitourinary:       Makes no urine   Musculoskeletal: Negative for back pain and neck pain.       Muscle spasm.   Skin: Negative for rash.  Neurological: Negative for headaches.     Physical Exam Updated Vital Signs BP (!) 166/62   Pulse 78   Temp 98.4 F (36.9 C) (Oral)   Resp (!) 21   Ht 5\' 10"  (1.778 m)   Wt 59.4 kg (131 lb)   SpO2 96%   BMI 18.80 kg/m   Physical Exam  Constitutional: He is oriented to person, place, and time. He appears well-developed and well-nourished. No distress.  Nontoxic appearing.  HENT:  Head: Normocephalic and atraumatic.  Mouth/Throat: Oropharynx is clear and moist.  Eyes: Pupils are equal, round, and reactive to light. Conjunctivae are normal. Right eye exhibits no discharge. Left eye exhibits no discharge.  Neck: Neck supple.  Cardiovascular: Normal rate, regular rhythm, normal heart sounds and intact distal pulses.   Pulmonary/Chest:  Effort normal and breath sounds normal. No respiratory distress. He has no wheezes. He has no rales.  Lungs clear to auscultation bilaterally.  Abdominal: Soft. There is no tenderness.  Musculoskeletal:  Patient is spontaneously moving all extremities in a coordinated fashion exhibiting good strength. No muscle spasms noted.   Lymphadenopathy:    He has no cervical adenopathy.  Neurological: He is alert and oriented to person, place, and time. Coordination normal.  Skin: Skin is warm and dry. Capillary refill takes less than 2 seconds. No rash noted. He is not diaphoretic. No erythema. No pallor.  Psychiatric: He has a normal mood and affect. His behavior is normal.  Nursing note and vitals reviewed.    ED Treatments / Results  Labs (all labs ordered are listed, but only abnormal results are displayed) Labs Reviewed  COMPREHENSIVE METABOLIC PANEL - Abnormal; Notable for the following:       Result Value   Potassium 3.2 (*)    Chloride 97 (*)    BUN <5 (*)    Creatinine, Ser 2.98 (*)    Calcium 8.8 (*)    Albumin 3.2 (*)    AST 13 (*)    ALT 6 (*)    GFR calc non Af Amer 20 (*)    GFR calc Af Amer 23 (*)    All other components within normal limits  CBC WITH DIFFERENTIAL/PLATELET - Abnormal; Notable for the following:    WBC 3.7 (*)    RBC 3.02 (*)    Hemoglobin 8.8 (*)    HCT 26.5 (*)    Platelets 127 (*)    All other components within normal limits    EKG  EKG Interpretation None       Radiology Dg Chest Portable 1 View  Result Date: 06/27/2017 CLINICAL DATA:  Sudden onset dyspnea tonight. EXAM: PORTABLE CHEST 1 VIEW COMPARISON:  04/04/2017 FINDINGS: Diffuse interstitial thickening, new and likely due to interstitial edema. No focal airspace consolidation. No large effusions. Normal heart size. Unremarkable hilar and mediastinal contours. IMPRESSION: Extensive interstitial thickening, probably interstitial edema. Electronically Signed   By: Andreas Newport M.D.    On: 06/27/2017 06:22    Procedures Procedures (including critical care time)  Medications Ordered in ED Medications - No data to display   Initial Impression / Assessment and Plan / ED Course  I  have reviewed the triage vital signs and the nursing notes.  Pertinent labs & imaging results that were available during my care of the patient were reviewed by me and considered in my medical decision making (see chart for details).    This is a 71 y.o. Male with ESRD on dialysis MWF who presents the emergency department saying that he feels fatigued and hungry. Patient also reports he's had some intermittent cramping of his right leg today. Patient is seen in the emergency department earlier today with shortness of breath. He was fluid overloaded and sent for dialysis. Patient was after dialysis his shortness of breath has resolved. He reports feeling better. He reports he did not feel ready to go home and he was sent to the emergency department. He tells me he is hungry and has not eaten today. He reports he lives alone at home and has an aide that comes to help him. He denies current shortness of breath. He denies current leg cramping.  On exam the patient is afebrile nontoxic appearing. His lungs are clear to auscultation bilaterally. He has oxygen saturation of 96% on room air. He has no increased work of breathing. No evidence of any muscle spasms or cramping on my exam. CMP is consistent with the patient in end-stage renal disease. Potassium is 3.2. Sodium is 136. CBC is over here alone of 8.8. Patient has had a slight trend downward in his hemoglobin over many months. Will need nephrology and primary care to work on improving his hemoglobin. Otherwise blood work is unremarkable today. Patient was given a sandwich and applesauce. At reevaluation he tells me he is feeling much better. He feels ready to go home. He has had no further muscle spasms or cramping. We'll discharge with close follow-up by  primary care and his nephrologist. I discussed return precautions. I advised the patient to follow-up with their primary care provider this week. I advised the patient to return to the emergency department with new or worsening symptoms or new concerns. The patient verbalized understanding and agreement with plan.    This patient was discussed with and evaluated by Dr. Ashok Cordia who agrees with assessment and plan.   Final Clinical Impressions(s) / ED Diagnoses   Final diagnoses:  Muscle spasm  Hungry, initial encounter  ESRD on dialysis [facility])    New Prescriptions New Prescriptions   No medications on file     Sharmaine Base 06/27/17 1617    Lajean Saver, MD 06/29/17 [phone]

## 2017-06-27 NOTE — ED Notes (Signed)
Pt spoke with his daughter and she is coming to get him. Pt escorted to waiting room in wheelchair to wait. VSS, NAD. Pt verbalized understanding of d/c instructions and has no further questions.

## 2017-06-27 NOTE — Procedures (Signed)
Pt presented to ED overnight with SOB and pulm edema by CXR.  HD was arranged, and the plan is if the pt is stable after HD plan is for dc home; if not improved plan will be to send back to ED (as new pt?).    I was present at this dialysis session, have reviewed the session itself and made  appropriate changes Kelly Splinter MD [place] pager [phone]   06/27/2017, 11:35 AM

## 2017-06-27 NOTE — Progress Notes (Signed)
Pt is done with HD, has many c/o's , "I still feel bad", doesn't feel he can go home.  He lives alone.  Nearly totally blind.  Will dc from HD and send to ED triage. Have d/w triage nurse earlier.   Kelly Splinter MD [employer] pgr [phone]   06/27/2017, 11:38 AM

## 2017-06-27 NOTE — Progress Notes (Signed)
pt discharged from hemodialysis; pt states he still feels bad; O2 changed from 6L to 2L [place] and pt has been sats have been between 98% - 100%. BPs dropped during HD and have since resolved; pt sent to triage. pt not being admitted; see Dr. [name] note. Called and talked to the Charge RN in the ED; and she advised me to dischange the pt from hemodialysis first before sending the pt back to triage.

## 2017-06-27 NOTE — ED Triage Notes (Signed)
Per Pt and Dialysis, Pt is being brought from dialysis after having bilateral hand cramping and left foot cramping that started while in dialysis. Pt was seen in ED for SOB by EDP and sent to dialysis for treatment. Pt reports decrease in SOB, but is still not feeling well. Nephrologist didn't feel comfortable sending patient home so they sent pt here to be evaluated.

## 2017-06-27 NOTE — ED Triage Notes (Signed)
Pt comes via [facility] for sudden onset of SOB, pt in tripod position stats 79% on room air and placed on CPAP, PTA received one nitro, Last received dialysis yesterday.

## 2017-06-27 NOTE — ED Provider Notes (Signed)
[place] DEPT Provider Note   CSN: [id number] Arrival date & time:        History   Chief Complaint Chief Complaint  Patient presents with  . Respiratory Distress    HPI KANIN LIA is a 71 y.o. male.  HPI   Patietn is a 71 year old male with past medical history significant f for ESRD on dialysis (M/W/F), HTN, HLD, non-occlusive CAD (12/17 Cath 60% stenosis to CFX and 30 Dx1), MI, CHF (EF 45-50%, mod LVH, G1DD in 12/2015), Paroxysmal Afib, AAA, CVA. [facility] found patient hypoxic to 80% on room air. Patient received nitroglycerin. Was last dialyzed yesterday. Patient reports a sudden increase in SOB PTA.     Past Medical History:  Diagnosis Date  . AAA (abdominal aortic aneurysm) ([place])   . Anxiety   . Arthritis   . Blindness   . CHF (congestive heart failure) ([place])   . 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 ESRD on hemodialysis ([place])    started HD 2014-15  . Headache(784.0)   . HTN (hypertension)   . Protein-calorie malnutrition ([place])   . Stroke [facility])     Patient Active Problem List   Diagnosis Date Noted  . Weakness of both legs 01/25/2017  . Hypertensive urgency 01/24/2017  . 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 Ischemic chest pain 11/01/2016  . Hypoxemia 09/07/2016  . Essential hypertension 03/08/2016  . Orthostatic hypotension 01/12/2016  . Dyslipidemia 01/12/2016  . Cough   . Weakness generalized 12/26/2015  . Chest pain 08/12/2015  . Accelerated hypertension 08/12/2015  . Carotid stenosis   . Stroke ([place]) 04/03/2015  . Abdominal aortic aneurysm ([place]) 03/06/2015  . Generalized weakness 09/08/2014  . Acute encephalopathy 09/07/2014  . Hypertensive emergency 07/05/2014  . Protein-calorie malnutrition, severe ([place]) 05/20/2014  . Hypokalemia 05/19/2014  . Syncope and collapse 05/19/2014  . ESRD on hemodialysis ([place]) 05/19/2014  . Anemia of renal disease 05/19/2014  . Coronary artery  disease, non-occlusive:  09/28/2012  . Anemia 09/27/2012  . Secondary hyperparathyroidism ([place]) 09/27/2012  . Non compliance with medical treatment 09/27/2012  . Chronic combined systolic and diastolic CHF (congestive heart failure) ([place]) 10/10/2011  . A-fib ([place]) 10/08/2011  . History of stroke 10/07/2011  . Cocaine abuse 10/07/2011  . Severe sinus bradycardia 10/07/2011  . Nonischemic cardiomyopathy ([place]) 09/18/2011  . Tobacco use disorder 08/07/2011  . Bruit 08/07/2011    Past Surgical History:  Procedure Laterality Date  . CARDIAC CATHETERIZATION N/A 11/01/2016   Procedure: Left Heart Cath and Coronary Angiography;  Surgeon: Lorretta Harp, MD;  Location: [place] CV LAB;  Service: Cardiovascular;  Laterality: N/A;  . INSERTION OF DIALYSIS CATHETER  09/21/2012   Procedure: INSERTION OF DIALYSIS CATHETER;  Surgeon: Angelia Mould, MD;  Location: [facility];  Service: Vascular;  Laterality: N/A;  Right Internal Jugular Placement  . LEFT HEART CATHETERIZATION WITH CORONARY ANGIOGRAM N/A 09/28/2012   Procedure: LEFT HEART CATHETERIZATION WITH CORONARY ANGIOGRAM;  Surgeon: Sherren Mocha, MD;  Location: [facility] C[facility] LAB;  Service: Cardiovascular;  Laterality: N/A;  . LEFT HEART CATHETERIZATION WITH CORONARY ANGIOGRAM N/A 07/07/2014   Procedure: LEFT HEART CATHETERIZATION WITH CORONARY ANGIOGRAM;  Surgeon: Leonie Man, MD;  Location: [facility] CATH LAB;  Service: Cardiovascular;  Laterality: N/A;  . right hand    . TEE WITHOUT CARDIOVERSION  10/09/2011   Procedure: TRANSESOPHAGEAL ECHOCARDIOGRAM (TEE);  Surgeon: Lelon Perla, MD;  Location: [facility] ENDOSCOPY;  Service: Cardiovascular;  Laterality: N/A;       Home Medications    Prior to Admission medications   Medication Sig Start Date End Date Taking? Authorizing Provider  acetaminophen (TYLENOL) 325 MG tablet Take 2 tablets (650 mg total) by mouth every 4 (four) hours as needed for headache or mild pain. 12/11/16   Geradine Girt, DO    amLODipine (NORVASC) 10 MG tablet Take 10 mg by mouth every evening. 04/13/17   [provider]  aspirin 81 MG chewable tablet Chew 1 tablet (81 mg total) by mouth daily. 11/04/16   Lyda Jester M, PA-C  atorvastatin (LIPITOR) 40 MG tablet Take 40 mg by mouth at bedtime.    [provider]  calcium acetate (PHOSLO) 667 MG capsule Take 1,334 mg by mouth 3 (three) times daily before meals.     [provider]  clopidogrel (PLAVIX) 75 MG tablet Take 1 tablet (75 mg total) by mouth daily. 04/05/15   Barton Dubois, MD  docusate sodium (COLACE) 100 MG capsule Take 200 mg by mouth daily.     [provider]  Elbasvir-Grazoprevir (ZEPATIER) 50-100 MG TABS Take 1 tablet by mouth daily.    [provider]  gabapentin (NEURONTIN) 100 MG capsule Take 1 capsule (100 mg total) by mouth 3 (three) times daily. 04/05/15   Barton Dubois, MD  isosorbide-hydrALAZINE (BIDIL) 20-37.5 MG tablet Take 1 tablet by mouth 3 (three) times daily. 11/03/16   Lyda Jester M, PA-C  metoprolol tartrate (LOPRESSOR) 25 MG tablet Take 1 tablet (25 mg total) by mouth 2 (two) times daily. 11/03/16   Lyda Jester M, PA-C  NIFEdipine (PROCARDIA-XL/ADALAT CC) 60 MG 24 hr tablet Take 1 tablet (60 mg total) by mouth daily. 12/12/16   Geradine Girt, DO  Nutritional Supplements (FEEDING SUPPLEMENT, NEPRO CARB STEADY,) LIQD Take 237 mLs by mouth 2 (two) times daily between meals. 12/11/16   Geradine Girt, DO  Omega-3 Fatty Acids (FISH OIL) 1000 MG CAPS Take 1,000 mg by mouth 2 (two) times daily.    [provider]    Family History Family History  Problem Relation Age of Onset  . Heart attack Mother        MI in her 75s  . Diabetes Mother   . Alcohol abuse Father   . Anesthesia problems Neg Hx   . Hypotension Neg Hx   . Malignant hyperthermia Neg Hx   . Pseudochol deficiency Neg Hx     Social History Social History  Substance Use Topics  . Smoking status: Current  Some Day Smoker    Packs/day: 0.50    Years: 50.00    Types: Cigarettes  . Smokeless tobacco: Never Used  . Alcohol use No     Comment: Occasional     Allergies   Patient has no known allergies.   Review of Systems Review of Systems  Unable to perform ROS: Acuity of condition  Constitutional: Negative for activity change.  Respiratory: Positive for chest tightness. Negative for shortness of breath.   Cardiovascular: Positive for chest pain. Negative for palpitations.  Gastrointestinal: Negative for abdominal pain.  Neurological: Negative for dizziness.     Physical Exam Updated Vital Signs BP (!) 144/85   Pulse 96   Temp 98 F (36.7 C)   Resp (!) 25   SpO2 100%   Physical Exam  Constitutional: He is oriented to person, place, and time. He appears well-nourished.  Arrived on BiPAP.  HENT:  Head: Normocephalic.  Eyes: Conjunctivae are normal.  Cardiovascular: Normal rate and regular rhythm.   Pulmonary/Chest:  Mild tachypnea.  Abdominal: Soft. He exhibits no distension. There is no tenderness.  Neurological: He is oriented to person, place, and time.  Skin: Skin is warm and dry. He is not diaphoretic.  Psychiatric: He has a normal mood and affect. His behavior is normal.     ED Treatments / Results  Labs (all labs ordered are listed, but only abnormal results are displayed) Labs Reviewed  COMPREHENSIVE METABOLIC PANEL  CBC WITH DIFFERENTIAL/PLATELET  BRAIN NATRIURETIC PEPTIDE  I-STAT TROPONIN, ED  I-STAT CG4 LACTIC ACID, ED    EKG  EKG Interpretation  Date/Time:  Saturday June 27 2017 05:46:58 EDT Ventricular Rate:  98 PR Interval:    QRS Duration: 98 QT Interval:  389 QTC Calculation: 497 R Axis:   50 Text Interpretation:  Sinus rhythm Multiform ventricular premature complexes LVH with secondary repolarization abnormality Borderline prolonged QT interval St flattening similar to prior.  Confirmed by Zenovia Jarred [phone]) on 06/27/2017 5:55:14  AM       Radiology No results found.  Procedures Procedures (including critical care time)  Medications Ordered in ED Medications - No data to display   Initial Impression / Assessment and Plan / ED Course  I have reviewed the triage vital signs and the nursing notes.  Pertinent labs & imaging results that were available during my care of the patient were reviewed by me and considered in my medical decision making (see chart for details).     Patietn is a 71 year old male with past medical history significant f for ESRD on dialysis (M/W/F), HTN, HLD, non-occlusive CAD (12/17 Cath 60% stenosis to CFX and 60 Dx1), MI, CHF (EF 45-50%, mod LVH, G1DD in 12/2015), Paroxysmal Afib, AAA, CVA. [facility] found patient hypoxic to 80% on room air. Patient received nitroglycerin. Was last dialyzed yesterday. Patient reports a sudden increase in SOB PTA.   Arrived on BiPAP. Patient not complaining of any chest pain now. Patient has mild tachypnea. Surprisingly little crackles. We will get chest x-ray, labs.  6:35 AM Patient's x-ray shows interstitial edema. Patient reports he had normal dialysis yesterday but they gave him "a lot of sailine". He is unsure if it was because he had a low blood pressure not.  Discussed with nephrology. We will think this is likely due to fluid overload. We will bring him up to dialysis. If not better discussed bringing him back emergency department for admission. However, given that his chief complaint shortness breath he had no chest pain and has evidence of fluid overload I think this is likely the root cause of his symptoms.  CRITICAL CARE Performed by: Gardiner Sleeper Total critical care time: 45 minutes Critical care time was exclusive of separately billable procedures and treating other patients. Critical care was necessary to treat or prevent imminent or life-threatening deterioration. Critical care was time spent personally by me on the following  activities: development of treatment plan with patient and/or surrogate as well as nursing, discussions with consultants, evaluation of patient's response to treatment, examination of patient, obtaining history from patient or surrogate, ordering and performing treatments and interventions, ordering and review of laboratory studies, ordering and review of radiographic studies, pulse oximetry and re-evaluation of patient's condition.   Final Clinical Impressions(s) / ED Diagnoses   Final diagnoses:  None    New Prescriptions New Prescriptions   No medications on file     Makye Radle, Fredia Sorrow, MD  06/27/17 0638  

## 2017-06-28 ENCOUNTER — Inpatient Hospital Stay (HOSPITAL_COMMUNITY): Payer: Medicare Other | Admitting: Anesthesiology

## 2017-06-28 ENCOUNTER — Encounter (HOSPITAL_COMMUNITY)
Admission: EM | Disposition: A | Discharge status: Skilled Nursing Facility | Payer: Self-pay | Source: Home / Self Care | Attending: Internal Medicine

## 2017-06-28 ENCOUNTER — Inpatient Hospital Stay (HOSPITAL_COMMUNITY): Payer: Medicare Other

## 2017-06-28 ENCOUNTER — Inpatient Hospital Stay (HOSPITAL_COMMUNITY)
Admission: EM | Admit: 2017-06-28 | Discharge: 2017-07-02 | DRG: 252 | Disposition: A | Discharge status: Skilled Nursing Facility | Payer: Medicare Other | Attending: Family Medicine | Admitting: Family Medicine

## 2017-06-28 ENCOUNTER — Emergency Department (HOSPITAL_COMMUNITY): Payer: Medicare Other

## 2017-06-28 ENCOUNTER — Encounter (HOSPITAL_COMMUNITY): Payer: Self-pay | Admitting: *Deleted

## 2017-06-28 DIAGNOSIS — F1410 Cocaine abuse, uncomplicated: Secondary | ICD-10-CM

## 2017-06-28 DIAGNOSIS — R627 Adult failure to thrive: Secondary | ICD-10-CM | POA: Diagnosis present

## 2017-06-28 DIAGNOSIS — Z8673 Personal history of transient ischemic attack (TIA), and cerebral infarction without residual deficits: Secondary | ICD-10-CM

## 2017-06-28 DIAGNOSIS — K828 Other specified diseases of gallbladder: Secondary | ICD-10-CM | POA: Diagnosis not present

## 2017-06-28 DIAGNOSIS — I69398 Other sequelae of cerebral infarction: Secondary | ICD-10-CM | POA: Diagnosis not present

## 2017-06-28 DIAGNOSIS — E43 Unspecified severe protein-calorie malnutrition: Secondary | ICD-10-CM | POA: Diagnosis not present

## 2017-06-28 DIAGNOSIS — I4891 Unspecified atrial fibrillation: Secondary | ICD-10-CM | POA: Diagnosis present

## 2017-06-28 DIAGNOSIS — I428 Other cardiomyopathies: Secondary | ICD-10-CM

## 2017-06-28 DIAGNOSIS — M6281 Muscle weakness (generalized): Secondary | ICD-10-CM | POA: Diagnosis not present

## 2017-06-28 DIAGNOSIS — N189 Chronic kidney disease, unspecified: Secondary | ICD-10-CM

## 2017-06-28 DIAGNOSIS — I5042 Chronic combined systolic (congestive) and diastolic (congestive) heart failure: Secondary | ICD-10-CM | POA: Diagnosis not present

## 2017-06-28 DIAGNOSIS — Z452 Encounter for adjustment and management of vascular access device: Secondary | ICD-10-CM

## 2017-06-28 DIAGNOSIS — R488 Other symbolic dysfunctions: Secondary | ICD-10-CM | POA: Diagnosis not present

## 2017-06-28 DIAGNOSIS — J810 Acute pulmonary edema: Secondary | ICD-10-CM | POA: Diagnosis present

## 2017-06-28 DIAGNOSIS — Z992 Dependence on renal dialysis: Secondary | ICD-10-CM

## 2017-06-28 DIAGNOSIS — Z91199 Patient's noncompliance with other medical treatment and regimen due to unspecified reason: Secondary | ICD-10-CM

## 2017-06-28 DIAGNOSIS — I132 Hypertensive heart and chronic kidney disease with heart failure and with stage 5 chronic kidney disease, or end stage renal disease: Secondary | ICD-10-CM | POA: Diagnosis not present

## 2017-06-28 DIAGNOSIS — I714 Abdominal aortic aneurysm, without rupture, unspecified: Secondary | ICD-10-CM | POA: Diagnosis present

## 2017-06-28 DIAGNOSIS — I5043 Acute on chronic combined systolic (congestive) and diastolic (congestive) heart failure: Secondary | ICD-10-CM | POA: Diagnosis present

## 2017-06-28 DIAGNOSIS — Z681 Body mass index (BMI) 19 or less, adult: Secondary | ICD-10-CM

## 2017-06-28 DIAGNOSIS — K7689 Other specified diseases of liver: Secondary | ICD-10-CM | POA: Diagnosis not present

## 2017-06-28 DIAGNOSIS — N2581 Secondary hyperparathyroidism of renal origin: Secondary | ICD-10-CM | POA: Diagnosis present

## 2017-06-28 DIAGNOSIS — I745 Embolism and thrombosis of iliac artery: Secondary | ICD-10-CM | POA: Diagnosis present

## 2017-06-28 DIAGNOSIS — F17210 Nicotine dependence, cigarettes, uncomplicated: Secondary | ICD-10-CM | POA: Diagnosis present

## 2017-06-28 DIAGNOSIS — B1920 Unspecified viral hepatitis C without hepatic coma: Secondary | ICD-10-CM | POA: Diagnosis present

## 2017-06-28 DIAGNOSIS — I2510 Atherosclerotic heart disease of native coronary artery without angina pectoris: Secondary | ICD-10-CM | POA: Diagnosis present

## 2017-06-28 DIAGNOSIS — D631 Anemia in chronic kidney disease: Secondary | ICD-10-CM | POA: Diagnosis not present

## 2017-06-28 DIAGNOSIS — E785 Hyperlipidemia, unspecified: Secondary | ICD-10-CM | POA: Diagnosis present

## 2017-06-28 DIAGNOSIS — I743 Embolism and thrombosis of arteries of the lower extremities: Secondary | ICD-10-CM | POA: Diagnosis not present

## 2017-06-28 DIAGNOSIS — I1311 Hypertensive heart and chronic kidney disease without heart failure, with stage 5 chronic kidney disease, or end stage renal disease: Secondary | ICD-10-CM | POA: Diagnosis not present

## 2017-06-28 DIAGNOSIS — R1312 Dysphagia, oropharyngeal phase: Secondary | ICD-10-CM | POA: Diagnosis not present

## 2017-06-28 DIAGNOSIS — D62 Acute posthemorrhagic anemia: Secondary | ICD-10-CM | POA: Diagnosis not present

## 2017-06-28 DIAGNOSIS — Z9119 Patient's noncompliance with other medical treatment and regimen: Secondary | ICD-10-CM | POA: Diagnosis not present

## 2017-06-28 DIAGNOSIS — J90 Pleural effusion, not elsewhere classified: Secondary | ICD-10-CM | POA: Diagnosis not present

## 2017-06-28 DIAGNOSIS — H5461 Unqualified visual loss, right eye, normal vision left eye: Secondary | ICD-10-CM | POA: Diagnosis present

## 2017-06-28 DIAGNOSIS — N186 End stage renal disease: Secondary | ICD-10-CM | POA: Diagnosis present

## 2017-06-28 DIAGNOSIS — R609 Edema, unspecified: Secondary | ICD-10-CM

## 2017-06-28 DIAGNOSIS — I480 Paroxysmal atrial fibrillation: Secondary | ICD-10-CM | POA: Diagnosis not present

## 2017-06-28 DIAGNOSIS — N429 Disorder of prostate, unspecified: Secondary | ICD-10-CM | POA: Diagnosis not present

## 2017-06-28 DIAGNOSIS — R110 Nausea: Secondary | ICD-10-CM

## 2017-06-28 DIAGNOSIS — Z48812 Encounter for surgical aftercare following surgery on the circulatory system: Secondary | ICD-10-CM | POA: Diagnosis not present

## 2017-06-28 DIAGNOSIS — Z95828 Presence of other vascular implants and grafts: Secondary | ICD-10-CM | POA: Diagnosis not present

## 2017-06-28 DIAGNOSIS — I517 Cardiomegaly: Secondary | ICD-10-CM | POA: Diagnosis not present

## 2017-06-28 DIAGNOSIS — D649 Anemia, unspecified: Secondary | ICD-10-CM | POA: Diagnosis present

## 2017-06-28 DIAGNOSIS — I429 Cardiomyopathy, unspecified: Secondary | ICD-10-CM | POA: Diagnosis not present

## 2017-06-28 DIAGNOSIS — I998 Other disorder of circulatory system: Secondary | ICD-10-CM | POA: Diagnosis not present

## 2017-06-28 DIAGNOSIS — F801 Expressive language disorder: Secondary | ICD-10-CM | POA: Diagnosis not present

## 2017-06-28 DIAGNOSIS — I120 Hypertensive chronic kidney disease with stage 5 chronic kidney disease or end stage renal disease: Secondary | ICD-10-CM | POA: Diagnosis not present

## 2017-06-28 DIAGNOSIS — R109 Unspecified abdominal pain: Secondary | ICD-10-CM

## 2017-06-28 DIAGNOSIS — R2689 Other abnormalities of gait and mobility: Secondary | ICD-10-CM | POA: Diagnosis not present

## 2017-06-28 HISTORY — PX: AXILLARY-FEMORAL BYPASS GRAFT: SHX894

## 2017-06-28 HISTORY — PX: ENDARTERECTOMY FEMORAL: SHX5804

## 2017-06-28 LAB — I-STAT CHEM 8, ED
BUN: 14 mg/dL (ref 6–20)
CHLORIDE: 98 mmol/L — AB (ref 101–111)
Calcium, Ion: 1.05 mmol/L — ABNORMAL LOW (ref 1.15–1.40)
Creatinine, Ser: 6.4 mg/dL — ABNORMAL HIGH (ref 0.61–1.24)
GLUCOSE: 138 mg/dL — AB (ref 65–99)
HCT: 27 % — ABNORMAL LOW (ref 39.0–52.0)
Hemoglobin: 9.2 g/dL — ABNORMAL LOW (ref 13.0–17.0)
Potassium: 3.4 mmol/L — ABNORMAL LOW (ref 3.5–5.1)
Sodium: 140 mmol/L (ref 135–145)
TCO2: 27 mmol/L (ref 0–100)

## 2017-06-28 LAB — COMPREHENSIVE METABOLIC PANEL
ALK PHOS: 73 U/L (ref 38–126)
ALT: 8 U/L — AB (ref 17–63)
AST: 16 U/L (ref 15–41)
Albumin: 3.5 g/dL (ref 3.5–5.0)
Anion gap: 17 — ABNORMAL HIGH (ref 5–15)
BUN: 14 mg/dL (ref 6–20)
CHLORIDE: 98 mmol/L — AB (ref 101–111)
CO2: 24 mmol/L (ref 22–32)
CREATININE: 6.12 mg/dL — AB (ref 0.61–1.24)
Calcium: 9.5 mg/dL (ref 8.9–10.3)
GFR calc Af Amer: 10 mL/min — ABNORMAL LOW (ref >60)
GFR, EST NON AFRICAN AMERICAN: 8 mL/min — AB (ref >60)
Glucose, Bld: 140 mg/dL — ABNORMAL HIGH (ref 65–99)
Potassium: 3.5 mmol/L (ref 3.5–5.1)
SODIUM: 139 mmol/L (ref 135–145)
Total Bilirubin: 0.9 mg/dL (ref 0.3–1.2)
Total Protein: 6.9 g/dL (ref 6.5–8.1)

## 2017-06-28 LAB — POCT I-STAT 4, (NA,K, GLUC, HGB,HCT)
Glucose, Bld: 105 mg/dL — ABNORMAL HIGH (ref 65–99)
HEMATOCRIT: 20 % — AB (ref 39.0–52.0)
Hemoglobin: 6.8 g/dL — CL (ref 13.0–17.0)
POTASSIUM: 3.8 mmol/L (ref 3.5–5.1)
SODIUM: 138 mmol/L (ref 135–145)

## 2017-06-28 LAB — PREPARE RBC (CROSSMATCH)

## 2017-06-28 LAB — CBC WITH DIFFERENTIAL/PLATELET
BASOS ABS: 0.1 10*3/uL (ref 0.0–0.1)
Basophils Relative: 1 %
EOS PCT: 6 %
Eosinophils Absolute: 0.4 10*3/uL (ref 0.0–0.7)
HCT: 27.5 % — ABNORMAL LOW (ref 39.0–52.0)
HEMOGLOBIN: 9.3 g/dL — AB (ref 13.0–17.0)
LYMPHS PCT: 26 %
Lymphs Abs: 1.7 10*3/uL (ref 0.7–4.0)
MCH: 29.4 pg (ref 26.0–34.0)
MCHC: 33.8 g/dL (ref 30.0–36.0)
MCV: 87 fL (ref 78.0–100.0)
Monocytes Absolute: 0.6 10*3/uL (ref 0.1–1.0)
Monocytes Relative: 9 %
NEUTROS PCT: 58 %
Neutro Abs: 3.9 10*3/uL (ref 1.7–7.7)
PLATELETS: 151 10*3/uL (ref 150–400)
RBC: 3.16 MIL/uL — AB (ref 4.22–5.81)
RDW: 14 % (ref 11.5–15.5)
WBC: 6.6 10*3/uL (ref 4.0–10.5)

## 2017-06-28 LAB — CK: Total CK: 79 U/L (ref 49–397)

## 2017-06-28 LAB — I-STAT TROPONIN, ED: Troponin i, poc: 0.04 ng/mL (ref 0.00–0.08)

## 2017-06-28 SURGERY — CREATION, BYPASS, ARTERIAL, AXILLARY TO BILATERAL FEMORAL, USING GRAFT
Anesthesia: General | Site: Groin | Laterality: Bilateral

## 2017-06-28 MED ORDER — MORPHINE SULFATE (PF) 4 MG/ML IV SOLN
4.0000 mg | Freq: Once | INTRAVENOUS | Status: AC
  Administered 2017-06-28: 4 mg via INTRAVENOUS
  Filled 2017-06-28: qty 1

## 2017-06-28 MED ORDER — METOPROLOL TARTRATE 25 MG PO TABS
25.0000 mg | ORAL_TABLET | Freq: Two times a day (BID) | ORAL | Status: DC
  Administered 2017-06-29 – 2017-06-30 (×3): 25 mg via ORAL
  Filled 2017-06-28 (×3): qty 1

## 2017-06-28 MED ORDER — NIFEDIPINE ER 60 MG PO TB24
60.0000 mg | ORAL_TABLET | Freq: Every day | ORAL | Status: DC
  Administered 2017-06-29: 60 mg via ORAL
  Filled 2017-06-28 (×3): qty 1

## 2017-06-28 MED ORDER — NEOSTIGMINE METHYLSULFATE 10 MG/10ML IV SOLN
INTRAVENOUS | Status: DC | PRN
  Administered 2017-06-28: 4 mg via INTRAVENOUS

## 2017-06-28 MED ORDER — LIDOCAINE 2% (20 MG/ML) 5 ML SYRINGE
INTRAMUSCULAR | Status: AC
  Filled 2017-06-28: qty 5

## 2017-06-28 MED ORDER — METOPROLOL TARTRATE 25 MG PO TABS: 25.0000 mg | ORAL_TABLET | Freq: Two times a day (BID) | ORAL | Status: DC

## 2017-06-28 MED ORDER — HEPARIN SODIUM (PORCINE) 1000 UNIT/ML IJ SOLN
INTRAMUSCULAR | Status: AC
  Filled 2017-06-28: qty 1

## 2017-06-28 MED ORDER — CLOPIDOGREL BISULFATE 75 MG PO TABS
75.0000 mg | ORAL_TABLET | Freq: Every day | ORAL | Status: DC
  Administered 2017-06-29 – 2017-07-02 (×4): 75 mg via ORAL
  Filled 2017-06-28 (×4): qty 1

## 2017-06-28 MED ORDER — ONDANSETRON HCL 4 MG/2ML IJ SOLN
INTRAMUSCULAR | Status: DC | PRN
  Administered 2017-06-28: 4 mg via INTRAVENOUS

## 2017-06-28 MED ORDER — AMLODIPINE BESYLATE 10 MG PO TABS
10.0000 mg | ORAL_TABLET | Freq: Every evening | ORAL | Status: DC
  Administered 2017-06-29 – 2017-06-30 (×2): 10 mg via ORAL
  Filled 2017-06-28: qty 1
  Filled 2017-06-28: qty 2

## 2017-06-28 MED ORDER — SODIUM CHLORIDE 0.9 % IV SOLN: Freq: Once | INTRAVENOUS | Status: DC

## 2017-06-28 MED ORDER — SODIUM CHLORIDE 0.9 % IV SOLN: 500.0000 mL | Freq: Once | INTRAVENOUS | Status: DC | PRN

## 2017-06-28 MED ORDER — SUCCINYLCHOLINE CHLORIDE 200 MG/10ML IV SOSY
PREFILLED_SYRINGE | INTRAVENOUS | Status: AC
  Filled 2017-06-28: qty 10

## 2017-06-28 MED ORDER — ATORVASTATIN CALCIUM 40 MG PO TABS
40.0000 mg | ORAL_TABLET | Freq: Every day | ORAL | Status: DC
  Administered 2017-06-29 – 2017-07-01 (×4): 40 mg via ORAL
  Filled 2017-06-28 (×4): qty 1

## 2017-06-28 MED ORDER — EPHEDRINE 5 MG/ML INJ
INTRAVENOUS | Status: AC
  Filled 2017-06-28: qty 10

## 2017-06-28 MED ORDER — LIDOCAINE HCL (PF) 1 % IJ SOLN: 5.0000 mL | INTRAMUSCULAR | Status: DC | PRN

## 2017-06-28 MED ORDER — ROCURONIUM BROMIDE 10 MG/ML (PF) SYRINGE
PREFILLED_SYRINGE | INTRAVENOUS | Status: AC
  Filled 2017-06-28: qty 5

## 2017-06-28 MED ORDER — HEPARIN SODIUM (PORCINE) 1000 UNIT/ML IJ SOLN
INTRAMUSCULAR | Status: DC | PRN
  Administered 2017-06-28: 4000 [IU] via INTRAVENOUS

## 2017-06-28 MED ORDER — FENTANYL CITRATE (PF) 250 MCG/5ML IJ SOLN
INTRAMUSCULAR | Status: AC
  Filled 2017-06-28: qty 5

## 2017-06-28 MED ORDER — MORPHINE SULFATE (PF) 2 MG/ML IV SOLN
2.0000 mg | INTRAVENOUS | Status: DC | PRN
  Administered 2017-06-28: 2 mg via INTRAVENOUS
  Filled 2017-06-28: qty 1

## 2017-06-28 MED ORDER — ASPIRIN 81 MG PO CHEW
81.0000 mg | CHEWABLE_TABLET | Freq: Every day | ORAL | Status: DC
  Administered 2017-06-29 – 2017-07-02 (×4): 81 mg via ORAL
  Filled 2017-06-28 (×4): qty 1

## 2017-06-28 MED ORDER — 0.9 % SODIUM CHLORIDE (POUR BTL) OPTIME
TOPICAL | Status: DC | PRN
  Administered 2017-06-28: 2000 mL

## 2017-06-28 MED ORDER — HYDROMORPHONE HCL 1 MG/ML IJ SOLN
0.2500 mg | INTRAMUSCULAR | Status: DC | PRN
  Administered 2017-06-28: 0.5 mg via INTRAVENOUS
  Administered 2017-06-28 (×2): 0.25 mg via INTRAVENOUS

## 2017-06-28 MED ORDER — HEPARIN SODIUM (PORCINE) 5000 UNIT/ML IJ SOLN
5000.0000 [IU] | Freq: Three times a day (TID) | INTRAMUSCULAR | Status: DC
  Administered 2017-06-29 – 2017-07-02 (×9): 5000 [IU] via SUBCUTANEOUS
  Filled 2017-06-28 (×7): qty 1

## 2017-06-28 MED ORDER — DOCUSATE SODIUM 100 MG PO CAPS
100.0000 mg | ORAL_CAPSULE | Freq: Every day | ORAL | Status: DC
  Administered 2017-06-29 – 2017-07-02 (×4): 100 mg via ORAL
  Filled 2017-06-28 (×4): qty 1

## 2017-06-28 MED ORDER — LABETALOL HCL 5 MG/ML IV SOLN: 10.0000 mg | INTRAVENOUS | Status: DC | PRN

## 2017-06-28 MED ORDER — SENNOSIDES-DOCUSATE SODIUM 8.6-50 MG PO TABS
1.0000 | ORAL_TABLET | Freq: Every evening | ORAL | Status: DC | PRN
  Filled 2017-06-28: qty 1

## 2017-06-28 MED ORDER — GLYCOPYRROLATE 0.2 MG/ML IJ SOLN
INTRAMUSCULAR | Status: DC | PRN
  Administered 2017-06-28: 0.6 mg via INTRAVENOUS

## 2017-06-28 MED ORDER — DEXTROSE 5 % IV SOLN
1.5000 g | Freq: Two times a day (BID) | INTRAVENOUS | Status: AC
  Administered 2017-06-29: 1.5 g via INTRAVENOUS
  Filled 2017-06-28: qty 1.5

## 2017-06-28 MED ORDER — ASPIRIN 81 MG PO CHEW: 81.0000 mg | CHEWABLE_TABLET | Freq: Every day | ORAL | Status: DC

## 2017-06-28 MED ORDER — SODIUM CHLORIDE 0.9 % IV SOLN: 100.0000 mL | INTRAVENOUS | Status: DC | PRN

## 2017-06-28 MED ORDER — BISACODYL 10 MG RE SUPP: 10.0000 mg | Freq: Every day | RECTAL | Status: DC | PRN

## 2017-06-28 MED ORDER — SODIUM CHLORIDE 0.9 % IV SOLN
INTRAVENOUS | Status: DC | PRN
  Administered 2017-06-28: 15:00:00

## 2017-06-28 MED ORDER — METOPROLOL TARTRATE 5 MG/5ML IV SOLN: 2.0000 mg | INTRAVENOUS | Status: DC | PRN

## 2017-06-28 MED ORDER — GABAPENTIN 100 MG PO CAPS
100.0000 mg | ORAL_CAPSULE | Freq: Three times a day (TID) | ORAL | Status: DC
  Administered 2017-06-29 – 2017-07-02 (×10): 100 mg via ORAL
  Filled 2017-06-28 (×10): qty 1

## 2017-06-28 MED ORDER — DEXAMETHASONE SODIUM PHOSPHATE 10 MG/ML IJ SOLN
INTRAMUSCULAR | Status: AC
  Filled 2017-06-28: qty 1

## 2017-06-28 MED ORDER — CEFAZOLIN SODIUM-DEXTROSE 1-4 GM/50ML-% IV SOLN
INTRAVENOUS | Status: DC | PRN
  Administered 2017-06-28: 1 g via INTRAVENOUS

## 2017-06-28 MED ORDER — ONDANSETRON HCL 4 MG PO TABS: 4.0000 mg | ORAL_TABLET | Freq: Four times a day (QID) | ORAL | Status: DC | PRN

## 2017-06-28 MED ORDER — PHENYLEPHRINE 40 MCG/ML (10ML) SYRINGE FOR IV PUSH (FOR BLOOD PRESSURE SUPPORT)
PREFILLED_SYRINGE | INTRAVENOUS | Status: AC
Start: 2017-06-28 — End: ?
  Filled 2017-06-28: qty 10

## 2017-06-28 MED ORDER — PROTAMINE SULFATE 10 MG/ML IV SOLN
INTRAVENOUS | Status: AC
  Filled 2017-06-28: qty 5

## 2017-06-28 MED ORDER — NEOSTIGMINE METHYLSULFATE 5 MG/5ML IV SOSY
PREFILLED_SYRINGE | INTRAVENOUS | Status: AC
  Filled 2017-06-28: qty 5

## 2017-06-28 MED ORDER — ARTIFICIAL TEARS OPHTHALMIC OINT
TOPICAL_OINTMENT | OPHTHALMIC | Status: DC | PRN
  Administered 2017-06-28: 1 via OPHTHALMIC

## 2017-06-28 MED ORDER — ALTEPLASE 2 MG IJ SOLR
2.0000 mg | Freq: Once | INTRAMUSCULAR | Status: DC | PRN
  Filled 2017-06-28: qty 2

## 2017-06-28 MED ORDER — CALCIUM ACETATE (PHOS BINDER) 667 MG PO CAPS
1334.0000 mg | ORAL_CAPSULE | Freq: Three times a day (TID) | ORAL | Status: DC
  Administered 2017-06-29 – 2017-07-02 (×9): 1334 mg via ORAL
  Filled 2017-06-28 (×9): qty 2

## 2017-06-28 MED ORDER — ROCURONIUM BROMIDE 100 MG/10ML IV SOLN
INTRAVENOUS | Status: DC | PRN
  Administered 2017-06-28 (×2): 5 mg via INTRAVENOUS
  Administered 2017-06-28: 40 mg via INTRAVENOUS

## 2017-06-28 MED ORDER — ACETAMINOPHEN 325 MG PO TABS: 650.0000 mg | ORAL_TABLET | ORAL | Status: DC | PRN

## 2017-06-28 MED ORDER — HEPARIN SODIUM (PORCINE) 1000 UNIT/ML DIALYSIS: 1000.0000 [IU] | INTRAMUSCULAR | Status: DC | PRN

## 2017-06-28 MED ORDER — ELBASVIR-GRAZOPREVIR 50-100 MG PO TABS
1.0000 | ORAL_TABLET | Freq: Every day | ORAL | Status: DC
Start: 2017-06-28 — End: 2017-06-29

## 2017-06-28 MED ORDER — HYDRALAZINE HCL 20 MG/ML IJ SOLN: 5.0000 mg | INTRAMUSCULAR | Status: DC | PRN

## 2017-06-28 MED ORDER — ISOSORB DINITRATE-HYDRALAZINE 20-37.5 MG PO TABS
1.0000 | ORAL_TABLET | Freq: Three times a day (TID) | ORAL | Status: DC
  Administered 2017-06-29 – 2017-07-02 (×10): 1 via ORAL
  Filled 2017-06-28 (×12): qty 1

## 2017-06-28 MED ORDER — ONDANSETRON HCL 4 MG/2ML IJ SOLN
4.0000 mg | Freq: Four times a day (QID) | INTRAMUSCULAR | Status: DC | PRN
  Administered 2017-06-29 – 2017-07-01 (×2): 4 mg via INTRAVENOUS
  Filled 2017-06-28 (×2): qty 2

## 2017-06-28 MED ORDER — IOPAMIDOL (ISOVUE-370) INJECTION 76%
INTRAVENOUS | Status: AC
  Administered 2017-06-28: 100 mL
  Filled 2017-06-28: qty 100

## 2017-06-28 MED ORDER — PROPOFOL 10 MG/ML IV BOLUS
INTRAVENOUS | Status: AC
  Filled 2017-06-28: qty 20

## 2017-06-28 MED ORDER — MIDAZOLAM HCL 5 MG/5ML IJ SOLN
INTRAMUSCULAR | Status: DC | PRN
  Administered 2017-06-28 (×2): 0.5 mg via INTRAVENOUS

## 2017-06-28 MED ORDER — GUAIFENESIN-DM 100-10 MG/5ML PO SYRP: 15.0000 mL | ORAL_SOLUTION | ORAL | Status: DC | PRN

## 2017-06-28 MED ORDER — ALUM & MAG HYDROXIDE-SIMETH 200-200-20 MG/5ML PO SUSP
15.0000 mL | ORAL | Status: DC | PRN
  Administered 2017-06-29 (×2): 30 mL via ORAL
  Filled 2017-06-28 (×3): qty 30

## 2017-06-28 MED ORDER — HYDROMORPHONE HCL 1 MG/ML IJ SOLN
INTRAMUSCULAR | Status: AC
  Administered 2017-06-28: 0.25 mg via INTRAVENOUS
  Filled 2017-06-28: qty 1

## 2017-06-28 MED ORDER — OXYCODONE-ACETAMINOPHEN 5-325 MG PO TABS
1.0000 | ORAL_TABLET | Freq: Four times a day (QID) | ORAL | Status: DC | PRN
  Administered 2017-06-30: 1 via ORAL
  Administered 2017-06-30: 2 via ORAL
  Filled 2017-06-28: qty 1
  Filled 2017-06-28: qty 2

## 2017-06-28 MED ORDER — HEPARIN BOLUS VIA INFUSION
3500.0000 [IU] | Freq: Once | INTRAVENOUS | Status: AC
  Administered 2017-06-28: 3500 [IU] via INTRAVENOUS
  Filled 2017-06-28: qty 3500

## 2017-06-28 MED ORDER — CEFAZOLIN SODIUM 1 G IJ SOLR
INTRAMUSCULAR | Status: AC
  Filled 2017-06-28: qty 10

## 2017-06-28 MED ORDER — PANTOPRAZOLE SODIUM 40 MG PO TBEC
40.0000 mg | DELAYED_RELEASE_TABLET | Freq: Every day | ORAL | Status: DC
  Administered 2017-06-29 – 2017-07-02 (×4): 40 mg via ORAL
  Filled 2017-06-28 (×4): qty 1

## 2017-06-28 MED ORDER — FENTANYL CITRATE (PF) 100 MCG/2ML IJ SOLN
INTRAMUSCULAR | Status: DC | PRN
  Administered 2017-06-28: 50 ug via INTRAVENOUS
  Administered 2017-06-28: 25 ug via INTRAVENOUS
  Administered 2017-06-28: 50 ug via INTRAVENOUS
  Administered 2017-06-28: 25 ug via INTRAVENOUS

## 2017-06-28 MED ORDER — PHENOL 1.4 % MT LIQD: 1.0000 | OROMUCOSAL | Status: DC | PRN

## 2017-06-28 MED ORDER — CLOPIDOGREL BISULFATE 75 MG PO TABS: 75.0000 mg | ORAL_TABLET | Freq: Every day | ORAL | Status: DC

## 2017-06-28 MED ORDER — PHENYLEPHRINE HCL 10 MG/ML IJ SOLN
INTRAMUSCULAR | Status: DC | PRN
  Administered 2017-06-28: 25 ug/min via INTRAVENOUS

## 2017-06-28 MED ORDER — PROTAMINE SULFATE 10 MG/ML IV SOLN
INTRAVENOUS | Status: DC | PRN
  Administered 2017-06-28 (×5): 10 mg via INTRAVENOUS

## 2017-06-28 MED ORDER — LIDOCAINE-PRILOCAINE 2.5-2.5 % EX CREA
1.0000 "application " | TOPICAL_CREAM | CUTANEOUS | Status: DC | PRN
  Filled 2017-06-28: qty 5

## 2017-06-28 MED ORDER — MIDAZOLAM HCL 2 MG/2ML IJ SOLN
INTRAMUSCULAR | Status: AC
  Filled 2017-06-28: qty 2

## 2017-06-28 MED ORDER — PROPOFOL 10 MG/ML IV BOLUS
INTRAVENOUS | Status: DC | PRN
  Administered 2017-06-28: 50 mg via INTRAVENOUS

## 2017-06-28 MED ORDER — SODIUM CHLORIDE 0.9 % IV SOLN
INTRAVENOUS | Status: DC | PRN
  Administered 2017-06-28: 15:00:00 via INTRAVENOUS

## 2017-06-28 MED ORDER — PENTAFLUOROPROP-TETRAFLUOROETH EX AERO: 1.0000 "application " | INHALATION_SPRAY | CUTANEOUS | Status: DC | PRN

## 2017-06-28 MED ORDER — ONDANSETRON HCL 4 MG/2ML IJ SOLN
INTRAMUSCULAR | Status: AC
  Filled 2017-06-28: qty 2

## 2017-06-28 MED ORDER — HEPARIN (PORCINE) IN NACL 100-0.45 UNIT/ML-% IJ SOLN
900.0000 [IU]/h | INTRAMUSCULAR | Status: DC
  Administered 2017-06-28: 900 [IU]/h via INTRAVENOUS
  Filled 2017-06-28: qty 250

## 2017-06-28 SURGICAL SUPPLY — 52 items
BAG ISOLATION DRAPE 18X18 (DRAPES) ×3 IMPLANT
CANISTER SUCT 3000ML PPV (MISCELLANEOUS) ×5 IMPLANT
CANNULA VESSEL 3MM 2 BLNT TIP (CANNULA) ×10 IMPLANT
CATH EMB 4FR 80CM (CATHETERS) ×5 IMPLANT
CLIP LIGATING EXTRA MED SLVR (CLIP) ×5 IMPLANT
CLIP LIGATING EXTRA SM BLUE (MISCELLANEOUS) ×5 IMPLANT
DERMABOND ADVANCED (GAUZE/BANDAGES/DRESSINGS) ×4
DERMABOND ADVANCED .7 DNX12 (GAUZE/BANDAGES/DRESSINGS) ×6 IMPLANT
DRAIN SNY 10X20 3/4 PERF (WOUND CARE) IMPLANT
DRAPE INCISE IOBAN 66X45 STRL (DRAPES) ×5 IMPLANT
DRAPE ISOLATION BAG 18X18 (DRAPES) ×2
DRAPE X-RAY CASS 24X20 (DRAPES) IMPLANT
ELECT REM PT RETURN 9FT ADLT (ELECTROSURGICAL) ×5
ELECTRODE REM PT RTRN 9FT ADLT (ELECTROSURGICAL) ×3 IMPLANT
EVACUATOR SILICONE 100CC (DRAIN) IMPLANT
GAUZE SPONGE 4X4 12PLY STRL (GAUZE/BANDAGES/DRESSINGS) ×5 IMPLANT
GLOVE BIOGEL PI IND STRL 6.5 (GLOVE) ×6 IMPLANT
GLOVE BIOGEL PI IND STRL 7.0 (GLOVE) ×6 IMPLANT
GLOVE BIOGEL PI IND STRL 7.5 (GLOVE) ×6 IMPLANT
GLOVE BIOGEL PI INDICATOR 6.5 (GLOVE) ×4
GLOVE BIOGEL PI INDICATOR 7.0 (GLOVE) ×4
GLOVE BIOGEL PI INDICATOR 7.5 (GLOVE) ×4
GLOVE SS BIOGEL STRL SZ 7.5 (GLOVE) ×3 IMPLANT
GLOVE SUPERSENSE BIOGEL SZ 7.5 (GLOVE) ×2
GLOVE SURG SS PI 7.5 STRL IVOR (GLOVE) ×10 IMPLANT
GOWN STRL REUS W/ TWL LRG LVL3 (GOWN DISPOSABLE) ×6 IMPLANT
GOWN STRL REUS W/ TWL XL LVL3 (GOWN DISPOSABLE) ×6 IMPLANT
GOWN STRL REUS W/TWL LRG LVL3 (GOWN DISPOSABLE) ×4
GOWN STRL REUS W/TWL XL LVL3 (GOWN DISPOSABLE) ×4
GRAFT CV 60X8STRG TUBE KNTD (Vascular Products) ×3 IMPLANT
GRAFT HEMASHIELD 8MM (Vascular Products) ×4 IMPLANT
GRAFT VASC STRG 30X8KNIT (Vascular Products) ×3 IMPLANT
INSERT FOGARTY SM (MISCELLANEOUS) ×10 IMPLANT
KIT BASIN OR (CUSTOM PROCEDURE TRAY) ×5 IMPLANT
KIT ROOM TURNOVER OR (KITS) ×5 IMPLANT
NS IRRIG 1000ML POUR BTL (IV SOLUTION) ×10 IMPLANT
PACK PERIPHERAL VASCULAR (CUSTOM PROCEDURE TRAY) ×5 IMPLANT
PAD ARMBOARD 7.5X6 YLW CONV (MISCELLANEOUS) ×10 IMPLANT
SET COLLECT BLD 21X3/4 12 (NEEDLE) IMPLANT
STAPLER VISISTAT 35W (STAPLE) IMPLANT
STOPCOCK 4 WAY LG BORE MALE ST (IV SETS) IMPLANT
SUT PROLENE 5 0 CC 1 (SUTURE) ×40 IMPLANT
SUT PROLENE 6 0 CC (SUTURE) ×10 IMPLANT
SUT SILK 2 0 FS (SUTURE) IMPLANT
SUT SILK 2 0 SH (SUTURE) ×5 IMPLANT
SUT VIC AB 2-0 CTX 36 (SUTURE) ×10 IMPLANT
SUT VIC AB 3-0 SH 27 (SUTURE) ×4
SUT VIC AB 3-0 SH 27X BRD (SUTURE) ×6 IMPLANT
SUT VICRYL 4-0 PS2 18IN ABS (SUTURE) ×5 IMPLANT
SYR 3ML LL SCALE MARK (SYRINGE) ×5 IMPLANT
TUBING EXTENTION W/L.L. (IV SETS) IMPLANT
WATER STERILE IRR 1000ML POUR (IV SOLUTION) ×5 IMPLANT

## 2017-06-28 NOTE — Progress Notes (Signed)
NIBP runs 20-30 lower than A-line - Dr Early & Dr Therisa Doyne aware- will use Aline for BP - per Dr Donnetta Hutching keep SBP greater than 90

## 2017-06-28 NOTE — Consult Note (Signed)
Hospital Consult    Reason for Consult:  Right leg pain/numbness Requesting Physician:  ER MRN #:  [id number]  History of Present Illness: This is a 71 y.o. male who presents to the ER today with c/o right leg pain and numbness.  He states it started about an hour or two before he arrived.  He does not have any pain in the left leg.  He presented to the ER twice yesterday with the first time having SOB and was felt to be fluid overloaded.  He was dialyzed and discharged home.  Later that afternoon, he came back to the ER with c/o intermittent cramping of his right leg.  His cramping improved and he was discharged home.  He returns today with c/o pain and numbness in the right leg.  He is on Neurontin.   He was seen by Dr. Donnetta Hutching in 2016 and had ABI's, which revealed 0.68 on the right and 0.76 on the left.  His waveforms were monophasic.  At that time, he also c/o right leg numbness and unsteady gait with ankle difficulty.    Pt had ultrasound of abdominal aorta in April 2016 by cardiology at which time the distal aorta measured 2.6 x 2.7cm and the right CIA measured 1.8 x 1.7cm, which was no change from previous exam.  He has known CAD and combined systolic/diastolic HF with EF of 19-50%.  He is on a beta blocker, CCB for blood pressure control.  He is on a statin for cholesterol management.    He takes aspirin/Plavix daily.  It is noted that he had a new diagnosis of Hepatitis C earlier this year by cardiology & is managed at the [facility].   He has hx of CVA, which cased right eye blindness and partial left eye blindness.  He is ESRD and on dialysis M/W/F.     Past Medical History:  Diagnosis Date  . AAA (abdominal aortic aneurysm) ([place])   . Anxiety   . Arthritis   . Blindness   . CHF (congestive heart failure) ([place])   . 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 ESRD on hemodialysis ([place])    started HD 2014-15  . Headache(784.0)     . HTN (hypertension)   . Protein-calorie malnutrition ([place])   . Stroke [facility])     Past Surgical History:  Procedure Laterality Date  . CARDIAC CATHETERIZATION N/A 11/01/2016   Procedure: Left Heart Cath and Coronary Angiography;  Surgeon: Lorretta Harp, MD;  Location: [place] CV LAB;  Service: Cardiovascular;  Laterality: N/A;  . INSERTION OF DIALYSIS CATHETER  09/21/2012   Procedure: INSERTION OF DIALYSIS CATHETER;  Surgeon: Angelia Mould, MD;  Location: [place];  Service: Vascular;  Laterality: N/A;  Right Internal Jugular Placement  . LEFT HEART CATHETERIZATION WITH CORONARY ANGIOGRAM N/A 09/28/2012   Procedure: LEFT HEART CATHETERIZATION WITH CORONARY ANGIOGRAM;  Surgeon: Sherren Mocha, MD;  Location: [facility] CATH LAB;  Service: Cardiovascular;  Laterality: N/A;  . LEFT HEART CATHETERIZATION WITH CORONARY ANGIOGRAM N/A 07/07/2014   Procedure: LEFT HEART CATHETERIZATION WITH CORONARY ANGIOGRAM;  Surgeon: Leonie Man, MD;  Location: [facility] CATH LAB;  Service: Cardiovascular;  Laterality: N/A;  . right hand    . TEE WITHOUT CARDIOVERSION  10/09/2011   Procedure: TRANSESOPHAGEAL ECHOCARDIOGRAM (TEE);  Surgeon: Lelon Perla, MD;  Location: [facility] ENDOSCOPY;  Service: Cardiovascular;  Laterality: N/A;    No Known Allergies  Prior  to Admission medications   Medication Sig Start Date End Date Taking? Authorizing Provider  acetaminophen (TYLENOL) 325 MG tablet Take 2 tablets (650 mg total) by mouth every 4 (four) hours as needed for headache or mild pain. 12/11/16   Geradine Girt, DO  amLODipine (NORVASC) 10 MG tablet Take 10 mg by mouth every evening. 04/13/17   [provider]  aspirin 81 MG chewable tablet Chew 1 tablet (81 mg total) by mouth daily. 11/04/16   Lyda Jester M, PA-C  atorvastatin (LIPITOR) 40 MG tablet Take 40 mg by mouth at bedtime.    [provider]  calcium acetate (PHOSLO) 667 MG capsule Take 1,334 mg by mouth 3 (three) times daily before  meals.     [provider]  clopidogrel (PLAVIX) 75 MG tablet Take 1 tablet (75 mg total) by mouth daily. 04/05/15   Barton Dubois, MD  docusate sodium (COLACE) 100 MG capsule Take 200 mg by mouth daily.     [provider]  Elbasvir-Grazoprevir (ZEPATIER) 50-100 MG TABS Take 1 tablet by mouth daily.    [provider]  gabapentin (NEURONTIN) 100 MG capsule Take 1 capsule (100 mg total) by mouth 3 (three) times daily. 04/05/15   Barton Dubois, MD  isosorbide-hydrALAZINE (BIDIL) 20-37.5 MG tablet Take 1 tablet by mouth 3 (three) times daily. 11/03/16   Lyda Jester M, PA-C  metoprolol tartrate (LOPRESSOR) 25 MG tablet Take 1 tablet (25 mg total) by mouth 2 (two) times daily. 11/03/16   Lyda Jester M, PA-C  NIFEdipine (PROCARDIA-XL/ADALAT CC) 60 MG 24 hr tablet Take 1 tablet (60 mg total) by mouth daily. 12/12/16   Geradine Girt, DO  Nutritional Supplements (FEEDING SUPPLEMENT, NEPRO CARB STEADY,) LIQD Take 237 mLs by mouth 2 (two) times daily between meals. 12/11/16   Geradine Girt, DO  Omega-3 Fatty Acids (FISH OIL) 1000 MG CAPS Take 1,000 mg by mouth 2 (two) times daily.    [provider]    Social History   Social History  . Marital status: Divorced    Spouse name: N/A  . Number of children: 2  . Years of education: N/A   Occupational History  . Not on file.   Social History Main Topics  . Smoking status: Current Some Day Smoker    Packs/day: 0.50    Years: 50.00    Types: Cigarettes  . Smokeless tobacco: Never Used  . Alcohol use No     Comment: Occasional  . Drug use: No  . Sexual activity: Yes   Other Topics Concern  . 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      Family History  Problem Relation Age of Onset  . Heart attack Mother        MI in her 34s  . Diabetes Mother   . Alcohol abuse Father   . Anesthesia problems Neg Hx   . Hypotension Neg Hx   . Malignant hyperthermia Neg Hx    . Pseudochol deficiency Neg Hx     ROS: [x]  Positive   [ ]  Negative   [ ]  All sytems reviewed and are negative  Cardiac: [x]  chest pain/pressure, Hx of earlier this year []  palpitations [x]  SOB yesterday-improved after dialysis []  DOE [x]  hx small AAA [x]  HTN  Vascular: [x]  pain in legs while walking-right [x]  pain in legs at rest-right [x]  pain in legs at night-right []  non-healing ulcers []  hx of  DVT []  swelling in legs  Pulmonary: []  productive cough []  asthma/wheezing []  home O2  Neurologic: []  weakness in []  arms []  legs [x]  numbness in right leg [x]  hx of CVA []  mini stroke [] difficulty speaking or slurred speech []  temporary loss of vision in one eye []  dizziness  Hematologic: []  hx of cancer []  bleeding problems []  problems with blood clotting easily  Endocrine:   []  diabetes []  thyroid disease  GI []  vomiting blood []  blood in stool [x]  hx Hepatitis C  GU: [x]  CKD/renal failure [x]  HD--[x]  M/W/F or []  T/T/S []  burning with urination []  blood in urine  Psychiatric: [x]  anxiety []  depression  Musculoskeletal: [x]  arthritis []  joint pain  Integumentary: []  rashes []  ulcers  Constitutional: []  fever []  chills   Physical Examination  Vitals:   06/28/17 1053  BP: 135/85  Pulse: 71  Resp: 18   There is no height or weight on file to calculate BMI.  General:  WDWN in mild distress Gait: Not observed HENT: WNL, normocephalic Pulmonary: normal non-labored breathing Cardiac: Regular Abdomen:  soft, NT/ND, no masses Skin: without rashes Vascular Exam/Pulses:  Right Left  Radial 2+ (normal) 2+ (normal)  Femoral absent absent  Popliteal absent absent  DP Absent-no doppler flow Absent-no doppler flow  PT Absent-no doppler flow Absent-no doppler flow   Extremities: there are no non healing wounds; pt has numbness from below the right knee.  Motor/sensation not intact right foot; motor is in tact left foot. Musculoskeletal: no  muscle wasting or atrophy  Neurologic: A&O X 3;  No focal weakness or paresthesias are detected; speech is fluent/normal Psychiatric:  The pt has Normal affect. Lymph:  No inguinal lymphadenopathy   CBC    Component Value Date/Time   WBC 3.7 (L) 06/27/2017 1302   RBC 3.02 (L) 06/27/2017 1302   HGB 9.2 (L) 06/28/2017 1133   HCT 27.0 (L) 06/28/2017 1133   PLT 127 (L) 06/27/2017 1302   MCV 87.7 06/27/2017 1302   MCH 29.1 06/27/2017 1302   MCHC 33.2 06/27/2017 1302   RDW 14.0 06/27/2017 1302   LYMPHSABS 0.8 06/27/2017 1302   MONOABS 0.4 06/27/2017 1302   EOSABS 0.1 06/27/2017 1302   BASOSABS 0.0 06/27/2017 1302    BMET    Component Value Date/Time   NA 140 06/28/2017 1133   K 3.4 (L) 06/28/2017 1133   CL 98 (L) 06/28/2017 1133   CO2 30 06/27/2017 1302   GLUCOSE 138 (H) 06/28/2017 1133   BUN 14 06/28/2017 1133   CREATININE 6.40 (H) 06/28/2017 1133   CALCIUM 8.8 (L) 06/27/2017 1302   GFRNONAA 20 (L) 06/27/2017 1302   GFRAA 23 (L) 06/27/2017 1302    COAGS: Lab Results  Component Value Date   INR 1.06 01/24/2017   INR 0.99 11/01/2016   INR 1.12 08/12/2015     Non-Invasive Vascular Imaging:   CTA with runoff is pending  Statin:  Yes.   Beta Blocker:  Yes.   Aspirin:  Yes.   ACEI:  No. ARB:  No. CCB use:  Yes Other antiplatelets/anticoagulants:  Yes.   Plavix   ASSESSMENT/PLAN: This is a 71 y.o. male with acute ischemia right leg  -pt has absent bilateral femoral pulses and acute ischemia RLE. Doppler signals are absent bilaterally.  Stat CTA with bilateral lower extremity runoff has been ordered and will proceed to OR based on results. -heparin started this am -pt is on aspirin and Plavix daily -ESRD on M/W/F-will need to notify nephrology  of his admission after surgery.   Leontine Locket, PA-C [facility] [phone]   I have examined the patient, reviewed and agree with above.Patient with known chronic moderate arterial  insufficiency. Presents with 2 day history of severe right leg cramping and now since this morning at no sensation or motor function in his right foot. He has minimal pulsation in his femoral pulses bilaterally. Feels have a severe calcification in his femoral arteries bilaterally. In audible flow with Doppler in his feet bilaterally. Will obtain stat CT scan and determine options for revascularization. Discussed with the patient length explaining that he does not have adequate flow to keep his legs viable currently. Certainly is at risk for ongoing neurologic deficit in his right leg with the level of profound ischemia.  Curt Jews, MD 06/28/2017 1:04 PM

## 2017-06-28 NOTE — H&P (Signed)
History and Physical    Trevor [name] RJJ:[id number] DOB: Nov 25, 1946 DOA: 06/28/2017   PCP: [name]   Patient coming from:  Home    Chief Complaint: R lower leg pain   HPI: Trevor [name] is a 71 y.o. 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 These started about one hour or 2 prior to arrival. Of note, he had been seen at the ED twice yesterday, due to shortness of breath, and was felt to be fluid overloaded, undergoing dialysis, and then discharged home. That afternoon, he came back to the emergency department with intermittent cramping in the right lower extremity, but these have improved during the visit, then discharge home. Today, the pain is severe, a companionship by numbness as mentioned above. Today, other than he has severe right lower extremity pain, denies any shortness of breath or chest pain, no nausea vomiting, or diarrhea. The patient does not make urine. No confusion was reported. He denies any headaches.   ED Course:  BP (!) 74/57   Pulse 71   Resp (!) 24   SpO2 98%  Reading  not correct   sodium 140 potassium 3.4 bicarb 24 BU and 14 creatinine 6.4 GFR 10 troponin 0.04 CK normal 79 hemoglobin 9.2, stable white count 6.6 platelets 151 last 2-D echo with EF 45 to 01%, grade 1 diastolic dysfunction, systolic function mildly reduced He was seen by Dr. Donnetta Hutching at the ED , and in view of  of acute ischemia of the right lower extremity, with absent bilateral femoral pulses, with negative Doppler signals, is to undergo CT Angio with bilateral lower extremity run off, in preparation for Aortobifemoral Bypass Graft, recommended that he be admitted under the Medicine Service    chest x-ray on 7/28 Extensive interstitial thickening, probably interstitial edema.   Review of Systems:  As per HPI otherwise all other systems reviewed and are negative  Past Medical History:  Diagnosis Date  . AAA (abdominal aortic aneurysm) ([place])   . Anxiety   . Arthritis   . Blindness   . CHF (congestive heart failure) ([place])   . 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 ESRD on hemodialysis ([place])    started HD 2014-15  . Headache(784.0)   . HTN (hypertension)   . Protein-calorie malnutrition ([place])   . Stroke [facility])     Past Surgical History:  Procedure Laterality Date  . CARDIAC CATHETERIZATION N/A 11/01/2016   Procedure: Left Heart Cath and Coronary Angiography;  Surgeon: Lorretta Harp, MD;  Location: [place] CV LAB;  Service: Cardiovascular;  Laterality: N/A;  . INSERTION OF DIALYSIS CATHETER  09/21/2012   Procedure: INSERTION OF DIALYSIS CATHETER;  Surgeon: Angelia Mould, MD;  Location: [place];  Service: Vascular;  Laterality: N/A;  Right Internal Jugular Placement  . LEFT HEART CATHETERIZATION WITH CORONARY ANGIOGRAM N/A 09/28/2012   Procedure: LEFT HEART CATHETERIZATION WITH CORONARY ANGIOGRAM;  Surgeon: Sherren Mocha, MD;  Location: [facility] CATH LAB;  Service: Cardiovascular;  Laterality: N/A;  . LEFT HEART CATHETERIZATION WITH CORONARY ANGIOGRAM N/A 07/07/2014   Procedure: LEFT HEART CATHETERIZATION WITH CORONARY ANGIOGRAM;  Surgeon: Leonie Man, MD;  Location:  [place] CATH LAB;  Service: Cardiovascular;  Laterality: N/A;  . right hand    . TEE WITHOUT CARDIOVERSION  10/09/2011   Procedure: TRANSESOPHAGEAL ECHOCARDIOGRAM (TEE);  Surgeon: Lelon Perla, MD;  Location: [facility] ENDOSCOPY;  Service: Cardiovascular;  Laterality: N/A;    Social History Social History   Social History  . Marital status: Divorced    Spouse name: N/A  . Number of children: 2  . Years of education: N/A   Occupational History  . Not on  file.   Social History Main Topics  . Smoking status: Current Some Day Smoker    Packs/day: 0.50    Years: 50.00    Types: Cigarettes  . Smokeless tobacco: Never Used  . Alcohol use No     Comment: Occasional  . Drug use: No  . Sexual activity: Yes   Other Topics Concern  . 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      No Known Allergies  Family History  Problem Relation Age of Onset  . Heart attack Mother        MI in her 30s  . Diabetes Mother   . Alcohol abuse Father   . Anesthesia problems Neg Hx   . Hypotension Neg Hx   . Malignant hyperthermia Neg Hx   . Pseudochol deficiency Neg Hx       Prior to Admission medications   Medication Sig Start Date End Date Taking? Authorizing Provider  acetaminophen (TYLENOL) 325 MG tablet Take 2 tablets (650 mg total) by mouth every 4 (four) hours as needed for headache or mild pain. 12/11/16   Geradine Girt, DO  amLODipine (NORVASC) 10 MG tablet Take 10 mg by mouth every evening. 04/13/17   [provider]  aspirin 81 MG chewable tablet Chew 1 tablet (81 mg total) by mouth daily. 11/04/16   Lyda Jester M, PA-C  atorvastatin (LIPITOR) 40 MG tablet Take 40 mg by mouth at bedtime.    [provider]  calcium acetate (PHOSLO) 667 MG capsule Take 1,334 mg by mouth 3 (three) times daily before meals.     [provider]  clopidogrel (PLAVIX) 75 MG tablet Take 1 tablet (75 mg total) by mouth daily. 04/05/15   Barton Dubois, MD  docusate sodium (COLACE) 100 MG capsule Take 200 mg by mouth daily.     [provider]  Elbasvir-Grazoprevir (ZEPATIER) 50-100 MG TABS Take 1 tablet by mouth daily.    [provider]  gabapentin (NEURONTIN) 100 MG capsule Take 1 capsule (100 mg total) by mouth 3 (three) times daily. 04/05/15   Barton Dubois, MD  isosorbide-hydrALAZINE (BIDIL) 20-37.5 MG tablet Take 1 tablet by mouth 3 (three) times daily. 11/03/16   Lyda Jester M, PA-C  metoprolol tartrate (LOPRESSOR) 25 MG tablet Take 1 tablet (25 mg total) by mouth 2 (two) times daily. 11/03/16   Lyda Jester M, PA-C  NIFEdipine (PROCARDIA-XL/ADALAT CC) 60 MG 24 hr tablet Take 1 tablet (60 mg total) by mouth daily. 12/12/16   Geradine Girt, DO  Nutritional Supplements (FEEDING SUPPLEMENT, NEPRO CARB STEADY,) LIQD Take 237 mLs by mouth 2 (two) times daily between meals. 12/11/16   Geradine Girt, DO  Omega-3 Fatty Acids (FISH OIL) 1000 MG CAPS Take 1,000 mg by mouth 2 (two) times daily.    [provider]    Physical Exam:  Vitals:   06/28/17 1215 06/28/17 1230 06/28/17 1245 06/28/17  1300  BP: 117/78 135/64 (!) 108/45 (!) 74/57  Pulse:      Resp: (!) 27 (!) 23 (!) 23 (!) 24  TempSrc:      SpO2:       Constitutional: NAD, mildly anxious  Eyes: Right eye blindness, L eye partial blindness ENMT: Mucous membranes are moist, without exudate or lesions  Neck: normal, supple, no masses, no thyromegaly Respiratory: decreased breath sounds at the bases  no wheezing, no crackles. Normal respiratory effort  Cardiovascular: Regular rate and rhythm, no murmurs, rubs or gallops. No extremity edema.Bilateral lower extremity pulses absent . No carotid bruits.  Abdomen: Soft, non tender, No hepatosplenomegaly. Bowel sounds positive.  Musculoskeletal  No muscle wasting, moves extremities. No  wounds he has numbness below R knee  .  Marland KitchenSkin: no jaundice, No lesions.  Neurologic: Sensation intact in LLE and motor normal in both LE    Psychiatric:   Alert and oriented x 3. anxious     Labs on Admission: I have personally reviewed following labs and imaging studies  CBC:  Recent Labs Lab 06/27/17 0550 06/27/17 1302 06/28/17 1125 06/28/17 1133  WBC 5.7 3.7* 6.6  --   NEUTROABS 3.9 2.3 3.9  --   HGB 9.1* 8.8* 9.3* 9.2*  HCT 27.7* 26.5* 27.5* 27.0*  MCV 88.2 87.7 87.0  --   PLT 152 127* 151  --     Basic Metabolic Panel:  Recent Labs Lab  06/27/17 0550 06/27/17 1302 06/28/17 1125 06/28/17 1133  NA 141 136 139 140  K 3.4* 3.2* 3.5 3.4*  CL 100* 97* 98* 98*  CO2 28 30 24   --   GLUCOSE 132* 88 140* 138*  BUN 15 <5* 14 14  CREATININE 6.14* 2.98* 6.12* 6.40*  CALCIUM 8.4* 8.8* 9.5  --     GFR: Estimated Creatinine Clearance: 8.9 mL/min (A) (by C-G formula based on SCr of 6.4 mg/dL (H)).  Liver Function Tests:  Recent Labs Lab 06/27/17 0550 06/27/17 1302 06/28/17 1125  AST 17 13* 16  ALT 7* 6* 8*  ALKPHOS 72 66 73  BILITOT 0.8 0.9 0.9  PROT 6.4* 6.7 6.9  ALBUMIN 3.1* 3.2* 3.5   No results for input(s): LIPASE, AMYLASE in the last 168 hours. No results for input(s): AMMONIA in the last 168 hours.  Coagulation Profile: No results for input(s): INR, PROTIME in the last 168 hours.  Cardiac Enzymes:  Recent Labs Lab 06/28/17 1125  CKTOTAL 79    BNP (last 3 results) No results for input(s): PROBNP in the last 8760 hours.  HbA1C: No results for input(s): HGBA1C in the last 72 hours.  CBG: No results for input(s): GLUCAP in the last 168 hours.  Lipid Profile: No results for input(s): CHOL, HDL, LDLCALC, TRIG, CHOLHDL, LDLDIRECT in the last 72 hours.  Thyroid Function Tests: No results for input(s): TSH, T4TOTAL, FREET4, T3FREE, THYROIDAB in the last 72 hours.  Anemia Panel: No results for input(s): VITAMINB12, FOLATE, FERRITIN, TIBC, IRON, RETICCTPCT in the last 72 hours.  Urine analysis:    Component Value Date/Time   COLORURINE YELLOW 04/04/2017 1833   APPEARANCEUR CLEAR 04/04/2017 1833   LABSPEC 1.008 04/04/2017 1833   PHURINE 9.0 (H) 04/04/2017 1833   GLUCOSEU 50 (A) 04/04/2017 1833   HGBUR MODERATE (A) 04/04/2017 1833   BILIRUBINUR NEGATIVE 04/04/2017 1833   [place] 04/04/2017 1833   PROTEINUR 100 (A) 04/04/2017 1833   UROBILINOGEN 0.2 01/08/2015 2116   NITRITE NEGATIVE 04/04/2017 1833   LEUKOCYTESUR  NEGATIVE 04/04/2017 1833    Sepsis  Labs: @LABRCNTIP (procalcitonin:4,lacticidven:4) )No results found for this or any previous visit (from the past 240 hour(s)).   Radiological Exams on Admission: Dg Chest Portable 1 View  Result Date: 06/27/2017 CLINICAL DATA:  Sudden onset dyspnea tonight. EXAM: PORTABLE CHEST 1 VIEW COMPARISON:  04/04/2017 FINDINGS: Diffuse interstitial thickening, new and likely due to interstitial edema. No focal airspace consolidation. No large effusions. Normal heart size. Unremarkable hilar and mediastinal contours. IMPRESSION: Extensive interstitial thickening, probably interstitial edema. Electronically Signed   By: Andreas Newport M.D.   On: 06/27/2017 06:22    EKG: Independently reviewed.  Assessment/Plan Active Problems:   Nonischemic cardiomyopathy (HCC)   History of stroke   Cocaine abuse   A-fib (HCC)   Chronic combined systolic and diastolic CHF (congestive heart failure) (HCC)   Anemia   Secondary hyperparathyroidism (HCC)   Non compliance with medical treatment   Coronary artery disease, non-occlusive:    ESRD on hemodialysis (HCC)   Anemia of renal disease   Protein-calorie malnutrition, severe (HCC)   Abdominal aortic aneurysm (HCC)   Pulmonary edema   Ischemia of extremity   Ischemic leg  Acute RLE ischemia,   seen by Dr. Donnetta Hutching at the ED , and in view of  of acute ischemia of the right lower extremity, with absent bilateral femoral pulses, with negative Doppler signals, is to undergo CT Angio with bilateral lower extremity run off, in preparation for Aortobifemoral Bypass Graf Admit to Tele inpatient 4 E  Heparin per Pharmacy   prior to angio, and then NPO after midnight  Pain control  Other plans as per VVS including pain control  Continue Neurontin   ESRD on HD MWF  Cr  Baseline 2-3 . Current  6.4   Nephrology involved, Dr.Schertz notified. Renal Diet. Other plans as per Nephrology Check CMET in am  Anemia of chronic disease Hemoglobin on admission 9.2 at [place] .   Repeat CBC in am  No transfusion is indicated at this time Type and screen     Hypertension  BP is reading lower BP but those values are incorrect, will continue to monitor  Continue home anti-hypertensive medications hold for BP less than 100/50 and prior to HD    Hyperlipidemia Continue home statins  Chronic combined systolic and diastolic CHF   last 2-D echo with EF 45 to 52%, grade 1 diastolic dysfunction, systolic function mildly reduced. Tn 0.04 EKG SR  COntinue meds when BP improves, and hold for BP less than 100/50 and prior to HD  monitor I/Os and daily weights prn 02    DVT prophylaxis: Heparin per Pharmacy  Code Status:   Full    Family Communication:  Discussed with patient Disposition Plan: Expect patient to be discharged to home after condition improves Consults called:    VVS per EDP  Admission status:Tele Inpatient     [facility] E, PA-C [facility]   06/28/2017, 2:02 PM

## 2017-06-28 NOTE — Consult Note (Addendum)
Renal Service Consult Note [facility]  Trevor [name] 06/28/2017 Trevor [name] D Requesting Physician:  Dr Aggie Moats  Reason for Consult:  ESRD pt with ischemic LE's HPI: The patient is a 71 y.o. year-old with history of HTN, CAD, CHF, ESRD on HD , prior CVA, blind in R eye and partially in L eye, CM EF 45%.  Pt was in ED yesterday with acute SOB due to pulm edema, treated w/ HD, sent back to ED after HD and was dc'd home.  He then woke up today w/ sig pain in the R leg and foot, numbness throughout the leg.  CT done showed extensive disease w/ bilat iliac artery occlusion and bilat SFA occlusions.  He was taken to OR this afternoon and underwent R axillofemoral bypass + femorofemoral bypass.  He is in PACU, bp's are soft in 80's, pt awake and alert.  Asked to see for ESRD.   Pt lives alone, divorced, has 2 children.  Worked in a Loss adjuster, chartered.  Has a WC, walker and cane at home.  Gets around but "slow".  Takes [facility] bus to dialysis MWF.  On HD several yrs.    ROS  denies CP  no joint pain   no HA  no blurry vision  no rash  no diarrhea  no nausea/ vomiting    Past Medical History  Past Medical History:  Diagnosis Date  . AAA (abdominal aortic aneurysm) ([place])   . Anxiety   . Arthritis   . Blindness   . CHF (congestive heart failure) ([place])   . 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 ESRD on hemodialysis ([place])    started HD 2014-15  . Headache(784.0)   . HTN (hypertension)   . Protein-calorie malnutrition ([place])   . Stroke [facility])    Past Surgical History  Past Surgical History:  Procedure Laterality Date  . CARDIAC CATHETERIZATION N/A 11/01/2016   Procedure: Left Heart Cath and Coronary Angiography;  Surgeon: Lorretta Harp, MD;  Location: [place] CV LAB;  Service: Cardiovascular;  Laterality: N/A;  . INSERTION OF DIALYSIS CATHETER  09/21/2012   Procedure: INSERTION OF DIALYSIS CATHETER;  Surgeon: Angelia Mould, MD;  Location: [place];  Service: Vascular;  Laterality: N/A;  Right Internal Jugular Placement  . LEFT HEART CATHETERIZATION WITH CORONARY ANGIOGRAM N/A 09/28/2012   Procedure: LEFT HEART CATHETERIZATION WITH CORONARY ANGIOGRAM;  Surgeon: Sherren Mocha, MD;  Location[facility] CATH LAB;  Service: Cardiovascular;  Laterality: N/A;  . LEFT HEART CATHETERIZATION WITH CORONARY ANGIOGRAM N/A 07/07/2014   Procedure: LEFT HEART CATHETERIZATION WITH CORONARY ANGIOGRAM;  Surgeon: Leonie Man, MD;  Location: [facility] CATH LAB;  Service: Cardiovascular;  Laterality: N/A;  . right hand    . TEE WITHOUT CARDIOVERSION  10/09/2011   Procedure: TRANSESOPHAGEAL ECHOCARDIOGRAM (TEE);  Surgeon: Lelon Perla, MD;  Location: [facility] ENDOSCOPY;  Service: Cardiovascular;  Laterality: N/A;   Family History  Family History  Problem Relation Age of Onset  . Heart attack Mother        MI in her 25s  . Diabetes Mother   . Alcohol abuse Father   . Anesthesia problems Neg Hx   . Hypotension Neg Hx   . Malignant hyperthermia Neg Hx   . Pseudochol deficiency Neg Hx    Social History  reports that he has been smoking Cigarettes.  He has a 25.00 pack-year smoking history. He has never used smokeless  tobacco. He reports that he does not drink alcohol or use drugs. Allergies No Known Allergies Home medications Prior to Admission medications   Medication Sig Start Date End Date Taking? Authorizing Provider  acetaminophen (TYLENOL) 325 MG tablet Take 2 tablets (650 mg total) by mouth every 4 (four) hours as needed for headache or mild pain. 12/11/16   Geradine Girt, DO  amLODipine (NORVASC) 10 MG tablet Take 10 mg by mouth every evening. 04/13/17   [provider]  aspirin 81 MG chewable tablet Chew 1 tablet (81 mg total) by mouth daily. 11/04/16   Lyda Jester M, PA-C  atorvastatin (LIPITOR) 40 MG tablet Take 40 mg by mouth at bedtime.    [provider]  calcium acetate (PHOSLO) 667 MG capsule Take  1,334 mg by mouth 3 (three) times daily before meals.     [provider]  clopidogrel (PLAVIX) 75 MG tablet Take 1 tablet (75 mg total) by mouth daily. 04/05/15   Barton Dubois, MD  docusate sodium (COLACE) 100 MG capsule Take 200 mg by mouth daily.     [provider]  Elbasvir-Grazoprevir (ZEPATIER) 50-100 MG TABS Take 1 tablet by mouth daily.    [provider]  gabapentin (NEURONTIN) 100 MG capsule Take 1 capsule (100 mg total) by mouth 3 (three) times daily. 04/05/15   Barton Dubois, MD  isosorbide-hydrALAZINE (BIDIL) 20-37.5 MG tablet Take 1 tablet by mouth 3 (three) times daily. 11/03/16   Lyda Jester M, PA-C  metoprolol tartrate (LOPRESSOR) 25 MG tablet Take 1 tablet (25 mg total) by mouth 2 (two) times daily. 11/03/16   Lyda Jester M, PA-C  NIFEdipine (PROCARDIA-XL/ADALAT CC) 60 MG 24 hr tablet Take 1 tablet (60 mg total) by mouth daily. 12/12/16   Geradine Girt, DO  Nutritional Supplements (FEEDING SUPPLEMENT, NEPRO CARB STEADY,) LIQD Take 237 mLs by mouth 2 (two) times daily between meals. 12/11/16   Geradine Girt, DO  Omega-3 Fatty Acids (FISH OIL) 1000 MG CAPS Take 1,000 mg by mouth 2 (two) times daily.    [provider]   Liver Function Tests  Recent Labs Lab 06/27/17 0550 06/27/17 1302 06/28/17 1125  AST 17 13* 16  ALT 7* 6* 8*  ALKPHOS 72 66 73  BILITOT 0.8 0.9 0.9  PROT 6.4* 6.7 6.9  ALBUMIN 3.1* 3.2* 3.5   No results for input(s): LIPASE, AMYLASE in the last 168 hours. CBC  Recent Labs Lab 06/27/17 0550 06/27/17 1302 06/28/17 1125 06/28/17 1133 06/28/17 1627  WBC 5.7 3.7* 6.6  --   --   NEUTROABS 3.9 2.3 3.9  --   --   HGB 9.1* 8.8* 9.3* 9.2* 6.8*  HCT 27.7* 26.5* 27.5* 27.0* 20.0*  MCV 88.2 87.7 87.0  --   --   PLT 152 127* 151  --   --    Basic Metabolic Panel  Recent Labs Lab 06/27/17 0550 06/27/17 1302 06/28/17 1125 06/28/17 1133 06/28/17 1627  NA 141 136 139 140 138  K 3.4* 3.2* 3.5 3.4* 3.8   CL 100* 97* 98* 98*  --   CO2 28 30 24   --   --   GLUCOSE 132* 88 140* 138* 105*  BUN 15 <5* 14 14  --   CREATININE 6.14* 2.98* 6.12* 6.40*  --   CALCIUM 8.4* 8.8* 9.5  --   --    Iron/TIBC/Ferritin/ %Sat    Component Value Date/Time   IRON 63 05/20/2014 0630   TIBC 198 (L)  05/20/2014 0630   FERRITIN 494 (H) 05/20/2014 0630   IRONPCTSAT 32 05/20/2014 0630    Vitals:   06/28/17 1812 06/28/17 1816 06/28/17 1818 06/28/17 1827  BP:  (!) 86/62    Pulse: 81 78 78 70  Resp:  (!) 22 16 16   TempSrc:      SpO2: 96% 94% 96% 95%   Exam Gen alert, frail elderly AAM, not in distress, nasal O2 No rash, cyanosis or gangrene Sclera anicteric, throat clear  No jvd or bruits Chest clear bilat, R axillary scar RRR no MRG Abd soft ntnd no mass or ascites +bs GU normal male MS dec'd pulses bilat feet, feet are slightly cool Ext no LE edema / no wounds or ulcers Neuro is alert, Ox 3, nf   Home meds > colace, nepro, norvasc, asa, lipitor, phoslo, plavix, Zepatier, neurontin, Bidil, metoprolol 25 bid, procardia XL 60/d   Na 139 K 3.5 Cr 6.1 (preop today at 11 am  Dialysis: pending    Impression: 1  Ischemia RLE > LLE :  Severe bilat arterial disease, now is sp R axillofemoral and femorofemoral bypass, done today by Dr Donnetta Hutching.   2  ESRD - stable vol on exam 3  HTN - pt has a-line since the manual BP's are falsely low.  Aline pressures 110/ 80 now. Home BP meds on hold 4  Hep C - on Rx 5  Volume - looks euvolemic. Was just here Sat with acute resp distress/ pulm edema , resolved w HD 6  MBD - resume binders when eating 7  Anemia of CKD/ ABL due to surgery - got 2 u prbc's this afternoon.  Repeat Hb pending.    Plan - plan HD Monday, 2nd shift, min UF, short Rx (had extra HD Sat). HD upstairs is stable.     Kelly Splinter MD [employer] pager [phone]   06/28/2017, 8:03 PM

## 2017-06-28 NOTE — Transfer of Care (Signed)
Immediate Anesthesia Transfer of Care Note  Patient: IVAAN LIDDY  Procedure(s) Performed: Procedure(s): BYPASS GRAFT RIGHT AXILLA-BIFEMORAL USING 8MM X 30CM AND 8MM X 60CM HEMASHIELD GOLD GRAFTS ENDARTERECTOMY BILATERAL FEMORAL ARTERIES (Bilateral)  Patient Location: PACU  Anesthesia Type:General  Level of Consciousness: awake and confused  Airway & Oxygen Therapy: Patient Spontanous Breathing and Patient connected to nasal cannula oxygen  Post-op Assessment: Report given to RN and Post -op Vital signs reviewed and stable  Post vital signs: Reviewed and stable  Last Vitals:  Vitals:   06/28/17 1245 06/28/17 1300  BP: (!) 108/45 (!) 74/57  Pulse:    Resp: (!) 23 (!) 24    Last Pain:  Vitals:   06/28/17 1401  TempSrc:   PainSc: 0-No pain         Complications: No apparent anesthesia complications

## 2017-06-28 NOTE — Op Note (Signed)
OPERATIVE REPORT  DATE OF SURGERY: 06/28/2017  PATIENT: Trevor [name], 71 y.o. male MRN: [id number]  DOB: June 15, 1946  PRE-OPERATIVE DIAGNOSIS: Critical limb ischemia bilaterally right greater than left  POST-OPERATIVE DIAGNOSIS:  Same  PROCEDURE: Right axillofemoral femorofemoral bypass with 8 mm Hemashield graft  SURGEON:  Curt Jews, M.D.  PHYSICIAN ASSISTANT: Samantha Rhyne PA-C  ANESTHESIA:  Gen.  EBL: 200 ml  Total I/O In: 815 [I.V.:500; Blood:315] Out: 200 [Blood:200]  BLOOD ADMINISTERED: 1 unit packed cells  DRAINS: None  SPECIMEN: None  COUNTS CORRECT:  YES  PLAN OF CARE: PACU with Doppler flow and posterior tibial bilaterally  PATIENT DISPOSITION:  PACU - hemodynamically stable  PROCEDURE DETAILS: Patient was taken to the operating room urgently where the right chest and the subclavian region and bilateral groins and both legs were prepped and draped in usual sterile fashion. Incision was made over the common femoral artery. The artery was extensively calcified and there was no pulse present. Separate incision was made in the infraclavicular region and the subclavian artery was exposed. This was done by her initially reflecting the pectoralis major and pectoralis minor muscles. The artery was of good caliber with minimal atherosclerotic change. Next a separate incision was made over the left common femoral artery and the common superficial femoral and profundus femoris artery was exposed here as it was on the right side. The artery also was extensively calcified with no pulse present. A tunnel was created from the level of the right groin to the right axillary incision and from the left groin to the right groin incision. The patient had a heparin drip running and this is been discontinued time the incision. He was given additional 4000 units intravenous heparin after adequate circulation time the subclavian artery was occluded proximally and distally and was  opened with 11 blade small shouldn't with Potts scissors. The 8 mm graft was sewn into side to the artery with a running 5-0 Prolene suture. This anastomosis was tested and found to be adequate. The graft was brought through subcutaneous tunnel down to the level of the right groin. The common superficial femoral and profundus femoris arteries were occluded. The common femoral artery was open and was circumferentially extensively calcified. There was no thrombus present. Fogarty catheter was passed proximally and was iliac artery was found to be occluded with no clot present. The catheter was sent down the superficial femoral which also was occluded with no new clot present in the profunda was patent with good backbleeding. The artery was too calcified to sew to. For this reason the common and superficial femoral femoral arteries were endarterectomized. Plaque was divided approximately and distally. The plaque was tacked to the artery at the common femoral artery and at the superficial femoral artery level. The profundus or was a large artery and the endarterectomy was done with an eversion technique into the internal and I distal profundus femoris artery. The graft was spatulated and sewn end-to-side to the artery with a running 5-0 Prolene suture. Prior to completion of the closure the usual flushing maneuvers were undertaken anastomosis completed and flow was restored to the right groin. Good Doppler flow is noted in the deep femoral artery. There was flow in the superficial femoral artery as well. Next attention was turned to the left groin. Left common superficial femoral and profundus femoris arteries were occluded. The common femoral artery was opened with 11 blade and sent longitudinally with Potts scissors. Again this artery was extensively superficially calcified. The  endarterectomy was undertaken over in the left groin as well. Again the plaque was tacked at the common femoral artery proximally and the  superficial femoral artery distally. The deep femoral artery was endarterectomized with an eversion technique. A 8 mm graft was brought onto the field and was spatulated and sewn end-to-side to the left common femoral artery with a 5-0 Prolene suture. After the anastomosis completed the graft was tunneled to the area of the right groin. The right axillofemoral graft was reoccluded and an ellipse of graft was removed and the femorofemoral graft was cut to appropriate length and the femorofemoral graft was sewn end-to-side to the right axillofemoral graft with a running 5-0 Prolene suture. Clamps removed and a good Doppler flow is noted in the deep femoral arteries bilaterally. The patient was given 50 mg of protamine to reverse the heparin. Wounds irrigated with saline. Hemostasis tablet cautery. The wounds were closed with 2-0 Vicryl in several layers and subcutaneous tissue and skin was closed with 3-0 subcuticular Vicryl stitches. Sterile dressing was applied the patient was transferred to the recovery room where he had audible posterior tibial signals bilaterally   Rosetta Posner, M.D., [facility] 06/28/2017 6:20 PM

## 2017-06-28 NOTE — Progress Notes (Signed)
New Admission Note:   Arrival Method: From PACU via bed Mental Orientation:  A&O Assessment: Completed Skin: Surgical incisions IV: RFA, L IJ Pain: 8/10 Tubes: A line Safety Measures: Safety Fall Prevention Plan has been discussed  Admission 4 [place] Orientation: Patient has been orientated to the room, unit and staff.  Family: none present at bedside  Orders to be reviewed and implemented. Will continue to monitor the patient. Call light has been placed within reach and bed alarm has been activated. Tele applied. CCMD notified    Isac Caddy, RN

## 2017-06-28 NOTE — Progress Notes (Signed)
Pt placed on hold - nurse unavailable to take report

## 2017-06-28 NOTE — Anesthesia Procedure Notes (Signed)
Arterial Line Insertion Start/End7/29/2018 1:30 PM, 06/28/2017 1:45 PM Performed by: [name], CRNA  Patient location: Pre-op. Lidocaine 1% used for infiltration and patient sedated radial was placed Catheter size: 20 G Hand hygiene performed  and maximum sterile barriers used   Procedure performed without using ultrasound guided technique. Following insertion, Biopatch and dressing applied. Post procedure assessment: normal  Patient tolerated the procedure well with no immediate complications.

## 2017-06-28 NOTE — ED Provider Notes (Signed)
[place] DEPT Provider Note   CSN: [id number] Arrival date & time: 06/28/17  1043     History   Chief Complaint Chief Complaint  Patient presents with  . Leg Pain    HPI TREYSEAN PETRUZZI is a 71 y.o. male hx of AAA, ESRD on HD (last HD yesterday), Previous stroke, who presenting with right leg pain. Patient came here yesterday twice for severe right leg pain. He has to have dialysis again back to the ED for pain control. Patient was given pain medicine and labs was at baseline. He states that his pain improved and then about an hour prior to arrival he has severe right leg pain associated with some numbness. Patient states that his leg feels cold.   The history is provided by the patient.    Past Medical History:  Diagnosis Date  . AAA (abdominal aortic aneurysm) ([place])   . Anxiety   . Arthritis   . Blindness   . CHF (congestive heart failure) ([place])   . 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 ESRD on hemodialysis ([place])    started HD 2014-15  . Headache(784.0)   . HTN (hypertension)   . Protein-calorie malnutrition ([place])   . Stroke [facility])     Patient Active Problem List   Diagnosis Date Noted  . Pulmonary edema 06/27/2017  . Weakness of both legs 01/25/2017  . Hypertensive urgency 01/24/2017  . 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 Ischemic chest pain 11/01/2016  . Hypoxemia 09/07/2016  . Essential hypertension 03/08/2016  . Orthostatic hypotension 01/12/2016  . Dyslipidemia 01/12/2016  . Cough   . Weakness generalized 12/26/2015  . Chest pain 08/12/2015  . Accelerated hypertension 08/12/2015  . Carotid stenosis   . Stroke ([place]) 04/03/2015  . Abdominal aortic aneurysm ([place]) 03/06/2015  . Generalized weakness 09/08/2014  . Acute encephalopathy 09/07/2014  . Hypertensive emergency 07/05/2014  . Protein-calorie malnutrition, severe ([place]) 05/20/2014  . Hypokalemia 05/19/2014  . Syncope and collapse  05/19/2014  . ESRD on hemodialysis ([place]) 05/19/2014  . Anemia of renal disease 05/19/2014  . Coronary artery disease, non-occlusive:  09/28/2012  . Anemia 09/27/2012  . Secondary hyperparathyroidism ([place]) 09/27/2012  . Non compliance with medical treatment 09/27/2012  . Chronic combined systolic and diastolic CHF (congestive heart failure) ([place]) 10/10/2011  . A-fib ([place]) 10/08/2011  . History of stroke 10/07/2011  . Cocaine abuse 10/07/2011  . Severe sinus bradycardia 10/07/2011  . Nonischemic cardiomyopathy ([place]) 09/18/2011  . Tobacco use disorder 08/07/2011  . Bruit 08/07/2011    Past Surgical History:  Procedure Laterality Date  . CARDIAC CATHETERIZATION N/A 11/01/2016   Procedure: Left Heart Cath and Coronary Angiography;  Surgeon: Lorretta Harp, MD;  Location: [facility] CV LAB;  Service: Cardiovascular;  Laterality: N/A;  . INSERTION OF DIALYSIS CATHETER  09/21/2012   Procedure: INSERTION OF DIALYSIS CATHETER;  Surgeon: Angelia Mould, MD;  Location: [place];  Service: Vascular;  Laterality: N/A;  Right Internal Jugular Placement  . LEFT HEART CATHETERIZATION WITH CORONARY ANGIOGRAM N/A 09/28/2012   Procedure: LEFT HEART CATHETERIZATION WITH CORONARY ANGIOGRAM;  Surgeon: Sherren Mocha, MD;  Location: [facility] CATH LAB;  Service: Cardiovascular;  Laterality: N/A;  . LEFT HEART CATHETERIZATION WITH CORONARY ANGIOGRAM N/A 07/07/2014   Procedure: LEFT HEART CATHETERIZATION WITH CORONARY ANGIOGRAM;  Surgeon: Leonie Man, MD;  Location: [facility] CATH LAB;  Service: Cardiovascular;  Laterality: N/A;  . right hand    .  TEE WITHOUT CARDIOVERSION  10/09/2011   Procedure: TRANSESOPHAGEAL ECHOCARDIOGRAM (TEE);  Surgeon: Lelon Perla, MD;  Location: [facility] ENDOSCOPY;  Service: Cardiovascular;  Laterality: N/A;       Home Medications    Prior to Admission medications   Medication Sig Start Date End Date Taking? Authorizing Provider  acetaminophen (TYLENOL) 325 MG tablet Take 2 tablets  (650 mg total) by mouth every 4 (four) hours as needed for headache or mild pain. 12/11/16   Geradine Girt, DO  amLODipine (NORVASC) 10 MG tablet Take 10 mg by mouth every evening. 04/13/17   [provider]  aspirin 81 MG chewable tablet Chew 1 tablet (81 mg total) by mouth daily. 11/04/16   Lyda Jester M, PA-C  atorvastatin (LIPITOR) 40 MG tablet Take 40 mg by mouth at bedtime.    [provider]  calcium acetate (PHOSLO) 667 MG capsule Take 1,334 mg by mouth 3 (three) times daily before meals.     [provider]  clopidogrel (PLAVIX) 75 MG tablet Take 1 tablet (75 mg total) by mouth daily. 04/05/15   Barton Dubois, MD  docusate sodium (COLACE) 100 MG capsule Take 200 mg by mouth daily.     [provider]  Elbasvir-Grazoprevir (ZEPATIER) 50-100 MG TABS Take 1 tablet by mouth daily.    [provider]  gabapentin (NEURONTIN) 100 MG capsule Take 1 capsule (100 mg total) by mouth 3 (three) times daily. 04/05/15   Barton Dubois, MD  isosorbide-hydrALAZINE (BIDIL) 20-37.5 MG tablet Take 1 tablet by mouth 3 (three) times daily. 11/03/16   Lyda Jester M, PA-C  metoprolol tartrate (LOPRESSOR) 25 MG tablet Take 1 tablet (25 mg total) by mouth 2 (two) times daily. 11/03/16   Lyda Jester M, PA-C  NIFEdipine (PROCARDIA-XL/ADALAT CC) 60 MG 24 hr tablet Take 1 tablet (60 mg total) by mouth daily. 12/12/16   Geradine Girt, DO  Nutritional Supplements (FEEDING SUPPLEMENT, NEPRO CARB STEADY,) LIQD Take 237 mLs by mouth 2 (two) times daily between meals. 12/11/16   Geradine Girt, DO  Omega-3 Fatty Acids (FISH OIL) 1000 MG CAPS Take 1,000 mg by mouth 2 (two) times daily.    [provider]    Family History Family History  Problem Relation Age of Onset  . Heart attack Mother        MI in her 33s  . Diabetes Mother   . Alcohol abuse Father   . Anesthesia problems Neg Hx   . Hypotension Neg Hx   . Malignant hyperthermia Neg Hx   .  Pseudochol deficiency Neg Hx     Social History Social History  Substance Use Topics  . Smoking status: Current Some Day Smoker    Packs/day: 0.50    Years: 50.00    Types: Cigarettes  . Smokeless tobacco: Never Used  . Alcohol use No     Comment: Occasional     Allergies   Patient has no known allergies.   Review of Systems Review of Systems  Musculoskeletal:       R leg pain   All other systems reviewed and are negative.    Physical Exam Updated Vital Signs BP 135/85 (BP Location: Right Arm)   Pulse 71   Resp 18   SpO2 98%   Physical Exam  Constitutional:  Uncomfortable, chronically ill   HENT:  Head: Normocephalic.  Mouth/Throat: Oropharynx is clear and moist.  Eyes: Pupils are equal, round, and reactive to light. Conjunctivae and EOM are  normal.  Neck: Normal range of motion. Neck supple.  Cardiovascular: Normal rate, regular rhythm and normal heart sounds.   Pulmonary/Chest: Effort normal and breath sounds normal. No respiratory distress. He has no wheezes. He has no rales.  Abdominal: Soft. Bowel sounds are normal. He exhibits no distension. There is no tenderness.  Musculoskeletal:  Bilateral legs cold. Faint dopperable pulse L DP. Unable to get dopperable pulse R DP or PT R leg is colder than left   Neurological: He is alert.  Skin: Skin is warm.  Psychiatric: He has a normal mood and affect.  Nursing note and vitals reviewed.    ED Treatments / Results  Labs (all labs ordered are listed, but only abnormal results are displayed) Labs Reviewed  CBC WITH DIFFERENTIAL/PLATELET - Abnormal; Notable for the following:       Result Value   RBC 3.16 (*)    Hemoglobin 9.3 (*)    HCT 27.5 (*)    All other components within normal limits  I-STAT CHEM 8, ED - Abnormal; Notable for the following:    Potassium 3.4 (*)    Chloride 98 (*)    Creatinine, Ser 6.40 (*)    Glucose, Bld 138 (*)    Calcium, Ion 1.05 (*)    Hemoglobin 9.2 (*)    HCT 27.0 (*)      All other components within normal limits  COMPREHENSIVE METABOLIC PANEL  CK  HEPARIN LEVEL (UNFRACTIONATED)  I-STAT TROPONIN, ED    EKG  EKG Interpretation None       Radiology Dg Chest Portable 1 View  Result Date: 06/27/2017 CLINICAL DATA:  Sudden onset dyspnea tonight. EXAM: PORTABLE CHEST 1 VIEW COMPARISON:  04/04/2017 FINDINGS: Diffuse interstitial thickening, new and likely due to interstitial edema. No focal airspace consolidation. No large effusions. Normal heart size. Unremarkable hilar and mediastinal contours. IMPRESSION: Extensive interstitial thickening, probably interstitial edema. Electronically Signed   By: Andreas Newport M.D.   On: 06/27/2017 06:22    Procedures Procedures (including critical care time)  CRITICAL CARE Performed by: Wandra Arthurs   Total critical care time:30  minutes  Critical care time was exclusive of separately billable procedures and treating other patients.  Critical care was necessary to treat or prevent imminent or life-threatening deterioration.  Critical care was time spent personally by me on the following activities: development of treatment plan with patient and/or surrogate as well as nursing, discussions with consultants, evaluation of patient's response to treatment, examination of patient, obtaining history from patient or surrogate, ordering and performing treatments and interventions, ordering and review of laboratory studies, ordering and review of radiographic studies, pulse oximetry and re-evaluation of patient's condition.   Medications Ordered in ED Medications  heparin bolus via infusion 3,500 Units (not administered)  heparin ADULT infusion 100 units/mL (25000 units/253mL sodium chloride 0.45%) (not administered)  iopamidol (ISOVUE-370) 76 % injection (not administered)  morphine 4 MG/ML injection 4 mg (4 mg Intravenous Given 06/28/17 1130)     Initial Impression / Assessment and Plan / ED Course  I have  reviewed the triage vital signs and the nursing notes.  Pertinent labs & imaging results that were available during my care of the patient were reviewed by me and considered in my medical decision making (see chart for details).     MICHALL NOFFKE is a 71 y.o. male here with R leg pain. No dopperable pulse R DP or PT and faint pulse on left. Has multiple risk factors for arterial  clot. I consulted Dr Donnetta Hutching from vascular at 11:15 am I ordered heparin. He will see patient.   11: 30 am Vascular PA saw patient. Ordered CT angio aorto/bifemoral. Heparin started.   1:40 PM CT done, reviewed by Dr Early. He was concerned for bilateral arterial occlusion. He will take patient for angiogram in the OR and possible fem/pop tomorrow. Hospitalist to admit.    Final Clinical Impressions(s) / ED Diagnoses   Final diagnoses:  Ischemia of both lower extremities    New Prescriptions New Prescriptions   No medications on file     Drenda Freeze, MD 06/28/17 1341

## 2017-06-28 NOTE — Progress Notes (Signed)
ANTICOAGULATION CONSULT NOTE - Initial Consult  Pharmacy Consult for heparin Indication: DVT  No Known Allergies  Patient Measurements:   Heparin Dosing Weight: 59.4kg  Vital Signs: Temp Source: Oral (07/29 1053) BP: 135/85 (07/29 1053) Pulse Rate: 71 (07/29 1053)  Labs:  Recent Labs  06/27/17 0550 06/27/17 1302  HGB 9.1* 8.8*  HCT 27.7* 26.5*  PLT 152 127*  CREATININE 6.14* 2.98*    Estimated Creatinine Clearance: 19.1 mL/min (A) (by C-G formula based on SCr of 2.98 mg/dL (H)).   Assessment: 28 YOM presenting in the ED with pain in R thigh that radiates down into lower R leg. Patient was seen in ED yesterday with SOB and intermittent cramping of R leg- was sent home after resolution of symptoms. Hgb 8.8, plts 127- no bleeding noted at this time. Patient is not on anticoagulation prior to admission.  Goal of Therapy:  Heparin level 0.3-0.7 units/ml Monitor platelets by anticoagulation protocol: Yes   Plan:  -heparin bolus with 3500 units IV x1, then start infusion at 900 units/hr -heparin level in 8 hours -daily heparin level and CBC -follow DVT workup and long term anticoagulation plans   Tamme Mozingo D. Goldman Birchall, PharmD, BCPS Clinical Pharmacist Pager: [phone] Clinical Phone for 06/28/2017 until 3:30pm: [phone] If after 3:30pm, please call main pharmacy at [phone] 06/28/2017 11:34 AM

## 2017-06-28 NOTE — Anesthesia Postprocedure Evaluation (Signed)
Anesthesia Post Note  Patient: SHAMUS DESANTIS  Procedure(s) Performed: Procedure(s) (LRB): BYPASS GRAFT RIGHT AXILLA-BIFEMORAL USING 8MM X 30CM AND 8MM X 60CM HEMASHIELD GOLD GRAFTS ENDARTERECTOMY BILATERAL FEMORAL ARTERIES (Bilateral)     Patient location during evaluation: PACU Anesthesia Type: General Level of consciousness: awake and alert Pain management: pain level controlled Vital Signs Assessment: post-procedure vital signs reviewed and stable Respiratory status: spontaneous breathing, nonlabored ventilation, respiratory function stable and patient connected to nasal cannula oxygen Cardiovascular status: blood pressure returned to baseline and stable Postop Assessment: no signs of nausea or vomiting Anesthetic complications: no    Last Vitals:  Vitals:   06/28/17 2029 06/28/17 2044  BP: (!) 80/48 (!) 93/52  Pulse: (!) 54 (!) 57  Resp: 13 (!) 8  Temp:      Last Pain:  Vitals:   06/28/17 1401  TempSrc:   PainSc: 0-No pain                 Sherman Donaldson,W. EDMOND

## 2017-06-28 NOTE — ED Triage Notes (Signed)
PT reports pain in His RT thigh that radiates into RT lower leg and   RT foot.Pt reports he can not feel his RT leg and at the same time reports he has the most sever pain in his Rt leg and foot. Pt moves his RT leg  With out assistance.

## 2017-06-28 NOTE — Progress Notes (Signed)
Patient ID: Trevor [name], male   DOB: Jun 23, 1946, 71 y.o.   MRN: [id number] CT reviewed. Extensive multilevel disease. Occlusion of the iliac vessels bilaterally with extremely calcified femoral arteries and superficial femoral artery occlusions bilaterally.  We'll proceed with emergent right axillofemoral and femorofemoral bypass. Discussed the procedure with patient and also with his daughter by telephone.  Also discussed with triad hospitalists to will admit the patient following surgery with multiple medical problems.

## 2017-06-28 NOTE — Anesthesia Procedure Notes (Signed)
Central Venous Catheter Insertion Performed by: Roderic Palau, anesthesiologist Start/End7/29/2018 2:15 PM, 06/28/2017 2:25 PM Patient location: Pre-op. Preanesthetic checklist: patient identified, IV checked, site marked, risks and benefits discussed, surgical consent, monitors and equipment checked, pre-op evaluation, timeout performed and anesthesia consent Position: Trendelenburg Lidocaine 1% used for infiltration and patient sedated Hand hygiene performed , maximum sterile barriers used  and Seldinger technique used Catheter size: 8 Fr Total catheter length 16. Central line was placed.Double lumen Procedure performed using ultrasound guided technique. Ultrasound Notes:anatomy identified, needle tip was noted to be adjacent to the nerve/plexus identified, no ultrasound evidence of intravascular and/or intraneural injection and image(s) printed for medical record Attempts: 1 Following insertion, dressing applied, line sutured and Biopatch. Post procedure assessment: blood return through all ports  Patient tolerated the procedure well with no immediate complications.

## 2017-06-28 NOTE — Anesthesia Procedure Notes (Signed)
Procedure Name: Intubation Date/Time: 06/28/2017 2:54 PM Performed by: Suzy Bouchard Pre-anesthesia Checklist: Patient identified, Emergency Drugs available, Suction available, Patient being monitored and Timeout performed Patient Re-evaluated:Patient Re-evaluated prior to induction Oxygen Delivery Method: Circle system utilized Preoxygenation: Pre-oxygenation with 100% oxygen Induction Type: IV induction Ventilation: Mask ventilation without difficulty Laryngoscope Size: Miller and 2 Grade View: Grade I Tube type: Oral Tube size: 7.5 mm Number of attempts: 1 Airway Equipment and Method: Stylet Placement Confirmation: ETT inserted through vocal cords under direct vision,  positive ETCO2 and breath sounds checked- equal and bilateral Secured at: 22 cm Tube secured with: Tape Dental Injury: Teeth and Oropharynx as per pre-operative assessment

## 2017-06-28 NOTE — Anesthesia Preprocedure Evaluation (Addendum)
Anesthesia Evaluation  Patient identified by MRN, date of birth, ID band Patient awake    Reviewed: Allergy & Precautions, H&P , NPO status , Patient's Chart, lab work & pertinent test results, reviewed documented beta blocker date and time   Airway Mallampati: II  TM Distance: >3 FB Neck ROM: Full    Dental no notable dental hx. (+) Poor Dentition, Dental Advisory Given   Pulmonary Current Smoker,    Pulmonary exam normal breath sounds clear to auscultation       Cardiovascular hypertension, Pt. on medications and Pt. on home beta blockers + Peripheral Vascular Disease and +CHF   Rhythm:Regular Rate:Normal     Neuro/Psych  Headaches, Anxiety CVA, Residual Symptoms negative psych ROS   GI/Hepatic negative GI ROS, Neg liver ROS,   Endo/Other  negative endocrine ROS  Renal/GU ESRF and DialysisRenal disease  negative genitourinary   Musculoskeletal  (+) Arthritis , Osteoarthritis,    Abdominal   Peds  Hematology negative hematology ROS (+) anemia ,   Anesthesia Other Findings   Reproductive/Obstetrics negative OB ROS                           Anesthesia Physical Anesthesia Plan  ASA: III and emergent  Anesthesia Plan: General   Post-op Pain Management:    Induction: Intravenous, Rapid sequence and Cricoid pressure planned  PONV Risk Score and Plan: 2 and Ondansetron and Dexamethasone  Airway Management Planned: Oral ETT  Additional Equipment: Arterial line, CVP and Ultrasound Guidance Line Placement  Intra-op Plan:   Post-operative Plan: Extubation in OR and Possible Post-op intubation/ventilation  Informed Consent: I have reviewed the patients History and Physical, chart, labs and discussed the procedure including the risks, benefits and alternatives for the proposed anesthesia with the patient or authorized representative who has indicated his/her understanding and acceptance.    Dental advisory given  Plan Discussed with: CRNA  Anesthesia Plan Comments:         Anesthesia Quick Evaluation

## 2017-06-29 ENCOUNTER — Encounter (HOSPITAL_COMMUNITY): Payer: Self-pay | Admitting: Vascular Surgery

## 2017-06-29 DIAGNOSIS — Z8673 Personal history of transient ischemic attack (TIA), and cerebral infarction without residual deficits: Secondary | ICD-10-CM

## 2017-06-29 DIAGNOSIS — I5042 Chronic combined systolic (congestive) and diastolic (congestive) heart failure: Secondary | ICD-10-CM

## 2017-06-29 DIAGNOSIS — N186 End stage renal disease: Secondary | ICD-10-CM

## 2017-06-29 DIAGNOSIS — Z992 Dependence on renal dialysis: Secondary | ICD-10-CM

## 2017-06-29 LAB — COMPREHENSIVE METABOLIC PANEL
ALBUMIN: 3 g/dL — AB (ref 3.5–5.0)
ALK PHOS: 61 U/L (ref 38–126)
ALT: 13 U/L — ABNORMAL LOW (ref 17–63)
ANION GAP: 11 (ref 5–15)
AST: 37 U/L (ref 15–41)
BILIRUBIN TOTAL: 0.8 mg/dL (ref 0.3–1.2)
BUN: 24 mg/dL — AB (ref 6–20)
CALCIUM: 8.4 mg/dL — AB (ref 8.9–10.3)
CO2: 27 mmol/L (ref 22–32)
Chloride: 100 mmol/L — ABNORMAL LOW (ref 101–111)
Creatinine, Ser: 7.22 mg/dL — ABNORMAL HIGH (ref 0.61–1.24)
GFR calc Af Amer: 8 mL/min — ABNORMAL LOW (ref >60)
GFR, EST NON AFRICAN AMERICAN: 7 mL/min — AB (ref >60)
GLUCOSE: 129 mg/dL — AB (ref 65–99)
Potassium: 4.4 mmol/L (ref 3.5–5.1)
Sodium: 138 mmol/L (ref 135–145)
TOTAL PROTEIN: 5.9 g/dL — AB (ref 6.5–8.1)

## 2017-06-29 LAB — BPAM RBC
Blood Product Expiration Date: 201808202359
Blood Product Expiration Date: 201808202359
ISSUE DATE / TIME: 201807291626
ISSUE DATE / TIME: 201807291626
UNIT TYPE AND RH: 7300
Unit Type and Rh: 7300

## 2017-06-29 LAB — TYPE AND SCREEN
ABO/RH(D): B POS
Antibody Screen: NEGATIVE
UNIT DIVISION: 0
UNIT DIVISION: 0

## 2017-06-29 LAB — PROTIME-INR
INR: 1.07
Prothrombin Time: 14 seconds (ref 11.4–15.2)

## 2017-06-29 MED ORDER — SODIUM CHLORIDE 0.9% FLUSH: 10.0000 mL | INTRAVENOUS | Status: DC | PRN

## 2017-06-29 MED ORDER — SODIUM CHLORIDE 0.9% FLUSH
10.0000 mL | Freq: Two times a day (BID) | INTRAVENOUS | Status: DC
  Administered 2017-06-29 – 2017-06-30 (×2): 10 mL

## 2017-06-29 NOTE — Evaluation (Signed)
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    Clinical Impression   Pt admitted with above. He demonstrates the below listed deficits and will benefit from continued OT to maximize safety and independence with BADLs.  Pt requires mod A for LB ADLs and min A for functional mobility.  He lives alone, feel he would benefit from [name] level rehab.  Will follow acutely.       Follow Up Recommendations  [name]    Equipment Recommendations  None recommended by OT    Recommendations for Other Services       Precautions / Restrictions Precautions Precautions: Fall Restrictions Weight Bearing Restrictions: No      Mobility Bed Mobility                  Transfers Overall transfer level: Needs assistance Equipment used: 1 person hand held assist Transfers: Sit to/from Stand;Squat Pivot Transfers Sit to Stand: Min assist         General transfer comment: min A to power up into standing and assist for balance.     Balance Overall balance assessment: Needs assistance Sitting-balance support: Feet supported;No upper extremity supported Sitting balance-Leahy Scale: Good Sitting balance - Comments: able to reach toward feet to doff socks    Standing balance support: Bilateral upper extremity supported Standing balance-Leahy Scale: Poor Standing balance comment: R knee buckling decreased static standing balance and increased reliance on UE with RW                           ADL either performed or assessed with  clinical judgement   ADL Overall ADL's : Needs assistance/impaired Eating/Feeding: Modified independent   Grooming: Wash/dry hands;Wash/dry face;Oral care;Brushing hair;Set up;Sitting   Upper Body Bathing: Set up;Sitting   Lower Body Bathing: Moderate assistance;Sit to/from stand Lower Body Bathing Details (indicate cue type and reason): assist for balance and for perineal area  Upper Body Dressing : Minimal assistance;Sitting   Lower Body Dressing: Moderate assistance;Sit to/from stand Lower Body Dressing Details (indicate cue type and reason): assist for balance, to pull pants over hips and to pull socks over toes  Toilet Transfer: Minimal assistance;Stand-pivot;BSC Toilet Transfer Details (indicate cue type and reason): min A for balance.  Toileting- Clothing Manipulation and Hygiene: Moderate assistance;Sit to/from stand       Functional mobility during ADLs: Minimal assistance;Rolling walker       Vision Baseline Vision/History: Legally blind Patient Visual Report: No change from baseline Additional Comments: Pt reports he can see outline of images, and some colors.       Perception     Praxis      Pertinent Vitals/Pain Pain Assessment: Faces Faces Pain Scale: Hurts even more Pain Location: R leg Pain Descriptors / Indicators: Aching;Burning;Sharp;Shooting Pain Intervention(s): Monitored during session;Repositioned     Hand Dominance Right   Extremity/Trunk Assessment Upper Extremity Assessment Upper Extremity Assessment: Generalized weakness   Lower Extremity Assessment Lower Extremity Assessment: Defer to PT evaluation RLE Deficits / Details: R vascular surgery  pain causing decreased knee and ankle ROM and strength RLE: Unable to fully assess due to pain RLE Sensation: decreased light touch (in toes and medial calf)   Cervical / Trunk Assessment Cervical / Trunk Assessment: Normal   Communication Communication Communication: No difficulties    Cognition Arousal/Alertness: Awake/alert Behavior During Therapy: WFL for tasks assessed/performed Overall Cognitive Status: Within Functional Limits for tasks assessed                                     General Comments       Exercises     Shoulder Instructions      Home Living Family/patient expects to be discharged to:: Private residence Living Arrangements: Alone Available Help at Discharge: Personal care attendant;Available PRN/intermittently Type of Home: House Home Access: Ramped entrance     Home Layout: One level     Bathroom Shower/Tub: Walk-in shower;Door   [employer] Toilet: Standard Bathroom Accessibility: Yes   Home Equipment: Environmental consultant - 2 wheels;Shower seat;Wheelchair - power          Prior Functioning/Environment Level of Independence: Needs assistance  Gait / Transfers Assistance Needed: RW in the house, wheelchair for HD ADL's / Homemaking Assistance Needed: Pt reports he is mod I for ADLs and assist for IADL.  He microwaves meals             OT Problem List: Decreased strength;Decreased activity tolerance;Impaired balance (sitting and/or standing);Impaired vision/perception;Decreased safety awareness;Decreased knowledge of use of DME or AE;Pain      OT Treatment/Interventions: Self-care/ADL training;DME and/or AE instruction;Therapeutic activities;Visual/perceptual remediation/compensation;Patient/family education;Balance training    OT Goals(Current goals can be found in the care plan section) Acute Rehab OT Goals Patient Stated Goal: to get better  OT Goal Formulation: With patient Time For Goal Achievement: 07/13/17 Potential to Achieve Goals: Good ADL Goals Pt Will Perform Grooming: with min guard assist;standing Pt Will Perform Lower Body Bathing: with min guard assist;sit to/from stand Pt Will Perform Lower Body Dressing: with min guard assist;sit to/from stand Pt Will Transfer to Toilet: with min guard  assist;ambulating;regular height toilet;bedside commode;grab bars Pt Will Perform Toileting - Clothing Manipulation and hygiene: with min guard assist;sit to/from stand  OT Frequency: Min 2X/week   Barriers to D/C: Decreased caregiver support          Co-evaluation              AM-PAC PT "6 Clicks" Daily Activity     Outcome Measure Help from another person eating meals?: None Help from another person taking care of personal grooming?: A Little Help from another person toileting, which includes using toliet, bedpan, or urinal?: A Lot Help from another person bathing (including washing, rinsing, drying)?: A Lot Help from another person to put on and taking off regular upper body clothing?: A Little Help from another person to put on and taking off regular lower body clothing?: A Lot 6 Click Score: 16   End of Session Nurse Communication: Mobility status  Activity Tolerance: Patient tolerated treatment well Patient left: in chair;with call bell/phone within reach;with chair alarm set  OT Visit Diagnosis: Unsteadiness on feet (R26.81);Pain Pain - Right/Left: Right Pain - part of body: Leg                Time: 1610-9604 OT Time Calculation (min): 28 min Charges:  OT General Charges $OT Visit: 1 Procedure OT Evaluation $OT Eval Moderate Complexity: 1  Procedure OT Treatments $Therapeutic Activity: 8-22 mins G-Codes:     [facility], OTR/L [phone]   Lucille Passy M 06/29/2017, 7:43 PM

## 2017-06-29 NOTE — Progress Notes (Addendum)
  Progress Note    06/29/2017 7:41 AM 1 Day Post-Op   Subjective:  Says he's nauseated;  Looks more comfortable than yesterday in the ER  Afebrile HR 50's-70's  01'U-932'T systolic 55% 7DU2GU  Vitals:   06/28/17 2353 06/29/17 0358  BP: (!) 92/48 (!) 124/56  Pulse: 77 61  Resp: 15 18  Temp: (!) 97.3 F (36.3 C) (!) 97.4 F (36.3 C)    Physical Exam: Cardiac:  regular Lungs:  Non labored Incisions:  Clean and dry without hematoma Extremities:  +monophasic peroneal doppler signals bilaterally Abdomen:  soft  CBC    Component Value Date/Time   WBC 6.6 06/28/2017 1125   RBC 3.16 (L) 06/28/2017 1125   HGB 6.8 (LL) 06/28/2017 1627   HCT 20.0 (L) 06/28/2017 1627   PLT 151 06/28/2017 1125   MCV 87.0 06/28/2017 1125   MCH 29.4 06/28/2017 1125   MCHC 33.8 06/28/2017 1125   RDW 14.0 06/28/2017 1125   LYMPHSABS 1.7 06/28/2017 1125   MONOABS 0.6 06/28/2017 1125   EOSABS 0.4 06/28/2017 1125   BASOSABS 0.1 06/28/2017 1125    BMET    Component Value Date/Time   NA 138 06/29/2017 0439   K 4.4 06/29/2017 0439   CL 100 (L) 06/29/2017 0439   CO2 27 06/29/2017 0439   GLUCOSE 129 (H) 06/29/2017 0439   BUN 24 (H) 06/29/2017 0439   CREATININE 7.22 (H) 06/29/2017 0439   CALCIUM 8.4 (L) 06/29/2017 0439   GFRNONAA 7 (L) 06/29/2017 0439   GFRAA 8 (L) 06/29/2017 0439    INR    Component Value Date/Time   INR 1.07 06/29/2017 0439     Intake/Output Summary (Last 24 hours) at 06/29/17 0741 Last data filed at 06/29/17 0301  Gross per 24 hour  Intake             1337 ml  Output              200 ml  Net             1137 ml     Assessment:  71 y.o. male is s/p:  Right axillofemoral femorofemoral bypass with 8 mm Hemashield graft  1 Day Post-Op  Plan: -bypass graft patent-pt with monophasic peroneal doppler signals this morning -pt with nausea-will change to clear liquid diet for now -DVT prophylaxis:  SQ heparin to start this afternoon -for HD this  afternoon -transfer to tele   Leontine Locket, PA-C [facility] [phone] 06/29/2017 7:41 AM  I have examined the patient, reviewed and agree with above. Reports that his pain has resolved following surgery. Still has markedly diminished sensation in his right foot. Does have good posterior tibial Doppler signal in his right foot. His foot is warm. He did have X long period of critical limb ischemia and will likely have some prolonged recover per referral nerve function. Both groin incisions and his axillary incision are healing nicely. Easily palpable subcutaneous graft pulses.  Curt Jews, MD 06/29/2017 4:04 PM

## 2017-06-29 NOTE — Progress Notes (Signed)
Patient ID: ZAKERY NORMINGTON, male   DOB: 1945-12-25, 71 y.o.   MRN: [id number]  [facility] Progress Note   Assessment/ Plan:   1. Critical lower extremity ischemia: Right lower extremity >left lower extremity-history underwent right axillofemoral and femoral-femoral bypass for revascularization of lower extremities. Clinically doing much better. Further management per vascular surgery. 2. ESRD: Plan for hemodialysis today, appears to be euvolemic and without any acute/cortical symptoms. 3. Anemia: Acute postoperative hemoglobin drop noted, plan for PRBC transfusion repeat hemoglobin this morning at dialysis remains low. 4. CKD-MBD: Gradually advancing diet-limited by nausea, will resume binders (calcium acetate) when oral intake is back to renal diet. 5. Nutrition: Continue gradual advancement of diet 6. Hypertension: Blood pressures currently acceptable, continue to monitor with recently restarted antihypertensives  Subjective:   Reports some nausea earlier this morning-currently none. On clear liquids. Reports no pain of the lower extremities.    Objective:   BP (!) 139/57 (BP Location: Other (Comment))   Pulse 73   Temp (!) 97.4 F (36.3 C) (Oral)   Resp 18   Ht 5\' 10"  (1.778 m)   Wt 58.7 kg (129 lb 8 oz)   SpO2 100%   BMI 18.58 kg/m   Physical Exam: QZE:SPQZRAQTMAU sleeping in bed.  QJF:HLKTG RRR, S1 and S2 with ejection systolic murmur Resp:CTA bilaterally, no rales/rhonchi. Surgical scars intact YBW:LSLH, flat, non-tender, BS normal Ext:No LE edema. Legs are warm to touch.  Labs: BMET  Recent Labs Lab 06/27/17 0550 06/27/17 1302 06/28/17 1125 06/28/17 1133 06/28/17 1627 06/29/17 0439  NA 141 136 139 140 138 138  K 3.4* 3.2* 3.5 3.4* 3.8 4.4  CL 100* 97* 98* 98*  --  100*  CO2 28 30 24   --   --  27  GLUCOSE 132* 88 140* 138* 105* 129*  BUN 15 <5* 14 14  --  24*  CREATININE 6.14* 2.98* 6.12* 6.40*  --  7.22*  CALCIUM 8.4* 8.8* 9.5  --   --  8.4*    CBC  Recent Labs Lab 06/27/17 0550 06/27/17 1302 06/28/17 1125 06/28/17 1133 06/28/17 1627  WBC 5.7 3.7* 6.6  --   --   NEUTROABS 3.9 2.3 3.9  --   --   HGB 9.1* 8.8* 9.3* 9.2* 6.8*  HCT 27.7* 26.5* 27.5* 27.0* 20.0*  MCV 88.2 87.7 87.0  --   --   PLT 152 127* 151  --   --    Medications:    . amLODipine  10 mg Oral QPM  . aspirin  81 mg Oral Daily  . atorvastatin  40 mg Oral QHS  . calcium acetate  1,334 mg Oral TID AC  . clopidogrel  75 mg Oral Daily  . docusate sodium  100 mg Oral Daily  . gabapentin  100 mg Oral TID  . heparin  5,000 Units Subcutaneous Q8H  . isosorbide-hydrALAZINE  1 tablet Oral TID  . metoprolol tartrate  25 mg Oral BID  . NIFEdipine  60 mg Oral Daily  . pantoprazole  40 mg Oral Daily  . sodium chloride flush  10-40 mL Intracatheter Q12H   Elmarie Shiley, MD 06/29/2017, 10:34 AM

## 2017-06-29 NOTE — Evaluation (Signed)
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   Clinical Impression  Patient is s/p above surgery resulting in functional limitations due to the deficits listed below (see PT Problem List). Pt is min guard for bed mobility, minA for transfers and modA for ambulation of 2 feet with RW.  Patient will benefit from skilled PT to increase their independence and safety with mobility to allow discharge to the venue listed below.       Follow Up Recommendations [name];Supervision/Assistance - 24 hour    Equipment Recommendations  None recommended by PT    Recommendations for Other Services OT consult     Precautions / Restrictions Precautions Precautions: Fall Restrictions Weight Bearing Restrictions: No      Mobility  Bed Mobility Overal bed mobility: Needs Assistance Bed Mobility: Supine to Sit     Supine to sit: Min guard     General bed mobility comments: min guard for safety, pt able to get LE to floor and trunk to upright, vc for hand placement and scooting hips to EoB  Transfers Overall transfer level: Needs assistance Equipment used: Rolling walker (2 wheeled) Transfers: Sit to/from [employer] Sit to Stand: Min assist   Squat pivot transfers: Min assist     General transfer comment: minA for power up to RW in sit>stand, after sitting down due to R LE pain and weakness, minA for squat pivot transfer to recliner   Ambulation/Gait Ambulation/Gait assistance: Mod assist Ambulation Distance (Feet): 2  Feet Assistive device: Rolling walker (2 wheeled) Gait Pattern/deviations: Step-to pattern;Antalgic;Trunk flexed;Decreased stance time - right;Decreased step length - left Gait velocity: slowed Gait velocity interpretation: Below normal speed for age/gender General Gait Details: modA for stability due to R knee buckling with weightbearing, with knee stabilization pt was able to take 5 steps before asking to sit due to pain      Balance Overall balance assessment: Needs assistance Sitting-balance support: Feet supported;No upper extremity supported Sitting balance-Leahy Scale: Fair Sitting balance - Comments: able to sit EoB for 2 min to allow BP to stabilize   Standing balance support: Bilateral upper extremity supported Standing balance-Leahy Scale: Poor Standing balance comment: R knee buckling decreased static standing balance and increased reliance on UE with RW                             Pertinent Vitals/Pain Pain Assessment: 0-10 Pain Score: 8  Pain Location: R leg Pain Descriptors / Indicators: Aching;Burning;Sharp;Shooting Pain Intervention(s): Limited activity within patient's tolerance;Monitored during session;Premedicated before session    [place] expects to be discharged to:: Private residence Living Arrangements: Alone Available Help at Discharge: Personal care attendant;Available PRN/intermittently Type of Home: House Home Access: Ramped entrance     Home Layout: One level Home Equipment: Walker - 2 wheels;Shower seat;Wheelchair - power      Prior Function Level of Independence: Needs assistance   Gait / Transfers Assistance Needed: RW in the house, wheelchair for HD  ADL's / Homemaking Assistance Needed: reports he  is independent with BADL, aide assists with cooking and cleaning        Hand Dominance   Dominant Hand: Right    Extremity/Trunk Assessment   Upper Extremity Assessment Upper Extremity Assessment:  Generalized weakness    Lower Extremity Assessment Lower Extremity Assessment: RLE deficits/detail;Generalized weakness RLE Deficits / Details: R vascular surgery pain causing decreased knee and ankle ROM and strength RLE: Unable to fully assess due to pain RLE Sensation: decreased light touch (in toes and medial calf)       Communication   Communication: No difficulties  Cognition Arousal/Alertness: Awake/alert Behavior During Therapy: WFL for tasks assessed/performed Overall Cognitive Status: Within Functional Limits for tasks assessed                                        General Comments General comments (skin integrity, edema, etc.): Pt on 1.5L/min O2 via nasal cannula and maintained SaO2 at 98%O2 so removed for session. at rest BP 147/48, HR 67 bpm, SaO2 98%O2, after activity BP 145/55, HR 65 bpm, SaO2 96%O2     Assessment/Plan    PT Assessment Patient needs continued PT services  PT Problem List Decreased strength;Decreased range of motion;Decreased activity tolerance;Decreased balance;Decreased mobility;Cardiopulmonary status limiting activity;Impaired sensation;Pain       PT Treatment Interventions DME instruction;Gait training;Functional mobility training;Therapeutic activities;Therapeutic exercise;Balance training;Patient/family education    PT Goals (Current goals can be found in the Care Plan section)  Acute Rehab PT Goals Patient Stated Goal: feel less tired PT Goal Formulation: With patient Time For Goal Achievement: 07/06/17 Potential to Achieve Goals: Good    Frequency Min 2X/week   Barriers to discharge Decreased caregiver support         AM-PAC PT "6 Clicks" Daily Activity  Outcome Measure Difficulty turning over in bed (including adjusting bedclothes, sheets and blankets)?: A Lot Difficulty moving from lying on back to sitting on the side of the bed? : A Lot Difficulty sitting down on and standing up from a chair with arms (e.g.,  wheelchair, bedside commode, etc,.)?: Total Help needed moving to and from a bed to chair (including a wheelchair)?: A Little Help needed walking in hospital room?: A Lot Help needed climbing 3-5 steps with a railing? : Total 6 Click Score: 11    End of Session Equipment Utilized During Treatment: Gait belt Activity Tolerance: Patient limited by pain;Patient limited by fatigue Patient left: in chair;with call bell/phone within reach;with chair alarm set Nurse Communication: Mobility status;Other (comment) (supplimental O2 removed due to SaO2>95%O2 throughout session) PT Visit Diagnosis: Unsteadiness on feet (R26.81);Other abnormalities of gait and mobility (R26.89);Muscle weakness (generalized) (M62.81);Difficulty in walking, not elsewhere classified (R26.2);Pain Pain - Right/Left: Right Pain - part of body: Leg    Time: 2130-8657 PT Time Calculation (min) (ACUTE ONLY): 43 min   Charges:   PT Evaluation $PT Eval Moderate Complexity: 1 Mod PT Treatments $Gait Training: 8-22 mins $Therapeutic Activity: 8-22 mins   PT G Codes:        Everest Hacking B. Migdalia Dk PT, DPT Acute Rehabilitation  [phone] Pager [phone]    [place] 06/29/2017, 2:18 PM

## 2017-06-29 NOTE — Progress Notes (Signed)
PROGRESS NOTE  Trevor [name]:[id number] DOB: 12/27/1945 DOA: 06/28/2017 PCP: [name]  HPI/Recap of past 24 hours:  He report right leg numbness, denies leg pain, report feeling nauseous  Cuff pressure is much lower than a line reading  Assessment/Plan: Active Problems:   Nonischemic cardiomyopathy (HCC)   History of stroke   Cocaine abuse   A-fib (HCC)   Chronic combined systolic and diastolic CHF (congestive heart failure) (HCC)   Anemia   Secondary hyperparathyroidism ([place])   Non compliance with medical treatment   Coronary artery disease, non-occlusive:    ESRD on hemodialysis ([place])   Anemia of renal disease   Protein-calorie malnutrition, severe (HCC)   Abdominal aortic aneurysm ([place])   Pulmonary edema   Ischemia of extremity   Ischemic leg   Critical limb ischemia bilaterally right greater than left -he presented to the ED c/o right leg pain, in view of  of acute ischemia of the right lower extremity, with absent bilateral femoral pulses, with negative Doppler signals, is to undergo CT Angio with bilateral lower extremity run off, in preparation for Aortobifemoral Bypass Graf on 7/29 -he received heparin drip and was brought to the OR on 7/29, s/p Right axillofemoral femorofemoral bypass with 8 mm Hemashield graft on 7/29 - currently on asa/plavix, neurontin, prophylaxis heparin, will follow vascular surgery recommendation  Chronic combined systolic and diastolic CHF    -last 2-D echo with EF 45 to 28%, grade 1 diastolic dysfunction, systolic function mildly reduced. -Tn 0.04 EKG SR  -cxr on admission with "Cardiac enlargement with mild pulmonary vascular congestion interstitial edema"  -continue lopressor/bidil, No edema on exam, Volume management per nephrology   ESRD on HD MWF    Nephrology involved, Renal Diet. Other plans as per Nephrology   Anemia of chronic disease Hemoglobin on admission 9.2 at [facility] .  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 - lft wnl, he denies ab pain, + nausea, no vomiting -Will discuss with general surgery  Pancreatic duct dilatation and mild prominence of the central biliary tree through the CBD.  -Incidental findings on ct scan -lft unremarkable, he denies ab pain, + nausea, no vomiting. -defer to outpatient MRCP to exclude ampullary lesion.  FTT; PT eval, likely need snf placement  DVT prophylaxis: Heparin per Pharmacy  Code Status:   Full    Family Communication:  Discussed with patient Disposition Plan: Expect patient to be discharged to home after condition improves Consults called:    VVS per EDP    Procedures: -Right axillofemoral femorofemoral bypass with 8 mm Hemashield graft on 7/29 -Aline placement on 7/29 -Left IJ placement on 7/29 -dialysis (MWF)   Antibiotics:  Perioperative zinacef   Objective: BP (!) 142/58 (BP Location: Other (Comment)) Comment (BP Location): ART  Pulse 69   Temp (!) 97.4 F (36.3 C) (Oral)   Resp 18   Ht 5\' 10"  (1.778 m)   Wt 58.7 kg (129 lb 8 oz)   SpO2 98%   BMI 18.58 kg/m   Intake/Output Summary (Last 24 hours)  at 06/29/17 1629 Last data filed at 06/29/17 0800  Gross per 24 hour  Intake             1177 ml  Output              150 ml  Net             1027 ml   Filed Weights   06/29/17 0358  Weight: 58.7 kg (129 lb 8 oz)    Exam:   General:  NAD, Both groin incisions and his axillary incision, clean. Left ij in place, right a line in  place.  Cardiovascular: RRR  Respiratory: diminished at basis, no wheezing, no rhonchi  Abdomen: Soft/ND/NT, positive BS  Musculoskeletal: No Edema, right lower extremity cooler than left lower extremity, decreased sensation right lower extremity  Neuro: aaox3  Data Reviewed: Basic Metabolic Panel:  Recent Labs Lab 06/27/17 0550 06/27/17 1302 06/28/17 1125 06/28/17 1133 06/28/17 1627 06/29/17 0439  NA 141 136 139 140 138 138  K 3.4* 3.2* 3.5 3.4* 3.8 4.4  CL 100* 97* 98* 98*  --  100*  CO2 28 30 24   --   --  27  GLUCOSE 132* 88 140* 138* 105* 129*  BUN 15 <5* 14 14  --  24*  CREATININE 6.14* 2.98* 6.12* 6.40*  --  7.22*  CALCIUM 8.4* 8.8* 9.5  --   --  8.4*   Liver Function Tests:  Recent Labs Lab 06/27/17 0550 06/27/17 1302 06/28/17 1125 06/29/17 0439  AST 17 13* 16 37  ALT 7* 6* 8* 13*  ALKPHOS 72 66 73 61  BILITOT 0.8 0.9 0.9 0.8  PROT 6.4* 6.7 6.9 5.9*  ALBUMIN 3.1* 3.2* 3.5 3.0*   No results for input(s): LIPASE, AMYLASE in the last 168 hours. No results for input(s): AMMONIA in the last 168 hours. CBC:  Recent Labs Lab 06/27/17 0550 06/27/17 1302 06/28/17 1125 06/28/17 1133 06/28/17 1627  WBC 5.7 3.7* 6.6  --   --   NEUTROABS 3.9 2.3 3.9  --   --   HGB 9.1* 8.8* 9.3* 9.2* 6.8*  HCT 27.7* 26.5* 27.5* 27.0* 20.0*  MCV 88.2 87.7 87.0  --   --   PLT 152 127* 151  --   --    Cardiac Enzymes:    Recent Labs Lab 06/28/17 1125  CKTOTAL 79   BNP (last 3 results)  Recent Labs  01/25/17 0059 04/04/17 1720 06/27/17 0550  BNP 1,608.5* 1,151.0* >4,500.0*    ProBNP (last 3 results) No results for input(s): PROBNP in the last 8760 hours.  CBG: No results for input(s): GLUCAP in the last 168 hours.  No results found for this or any previous visit (from the past 240 hour(s)).   Studies: Dg Chest Port 1 View  Result Date: 06/29/2017 CLINICAL DATA:  Central line placement EXAM: PORTABLE CHEST 1 VIEW COMPARISON:  06/27/2017 FINDINGS:  Interval placement of left central venous catheter with tip over the mid SVC region likely at the junction of the SVC and brachiocephalic vein. No pneumothorax. Mild cardiac enlargement with mild pulmonary vascular congestion. Interstitial pattern to the lungs suggesting edema. Similar appearance to previous study. Small amount of fluid in the right minor fissure but no pleural effusions identified. No pneumothorax. Calcified and tortuous aorta. Degenerative changes in the spine. IMPRESSION: Left central venous catheter tip projected over the mid SVC at the junction of the SVC and brachiocephalic vein. Cardiac enlargement with mild pulmonary vascular congestion interstitial edema.  Electronically Signed   By: Lucienne Capers M.D.   On: 06/29/2017 03:39    Scheduled Meds: . amLODipine  10 mg Oral QPM  . aspirin  81 mg Oral Daily  . atorvastatin  40 mg Oral QHS  . calcium acetate  1,334 mg Oral TID AC  . clopidogrel  75 mg Oral Daily  . docusate sodium  100 mg Oral Daily  . gabapentin  100 mg Oral TID  . heparin  5,000 Units Subcutaneous Q8H  . isosorbide-hydrALAZINE  1 tablet Oral TID  . metoprolol tartrate  25 mg Oral BID  . NIFEdipine  60 mg Oral Daily  . pantoprazole  40 mg Oral Daily  . sodium chloride flush  10-40 mL Intracatheter Q12H    Continuous Infusions: . sodium chloride    . sodium chloride    . sodium chloride    . sodium chloride    . sodium chloride       Time spent: 42mins  Atiya Yera MD, PhD  [facility] Pager [phone]. If 7PM-7AM, please contact night-coverage at [email or web address], password [facility] 06/29/2017, 4:29 PM  LOS: 1 day

## 2017-06-30 ENCOUNTER — Inpatient Hospital Stay (HOSPITAL_COMMUNITY): Payer: Medicare Other

## 2017-06-30 DIAGNOSIS — Z95828 Presence of other vascular implants and grafts: Secondary | ICD-10-CM

## 2017-06-30 DIAGNOSIS — Z9119 Patient's noncompliance with other medical treatment and regimen: Secondary | ICD-10-CM

## 2017-06-30 MED ORDER — RENA-VITE PO TABS
1.0000 | ORAL_TABLET | Freq: Every day | ORAL | Status: DC
  Administered 2017-06-30 – 2017-07-01 (×2): 1 via ORAL
  Filled 2017-06-30 (×2): qty 1

## 2017-06-30 MED ORDER — METOPROLOL TARTRATE 12.5 MG HALF TABLET
12.5000 mg | ORAL_TABLET | Freq: Two times a day (BID) | ORAL | Status: DC
  Administered 2017-06-30 – 2017-07-02 (×4): 12.5 mg via ORAL
  Filled 2017-06-30 (×4): qty 1

## 2017-06-30 NOTE — NC FL2 (Signed)
[place] MEDICAID FL2 LEVEL OF CARE SCREENING TOOL     IDENTIFICATION  Patient Name: Trevor [name] Birthdate: 1946/03/26 Sex: male Admission Date (Current Location): 06/28/2017  [facility] and [place] Number:  Herbalist and Address:  [facility] [place], [place] [address], [place], [place] [address]      Provider Number: [id number]  Attending Physician Name and Address:  Florencia Reasons, MD  Relative Name and Phone Number:       Current Level of Care: Hospital Recommended Level of Care: [place] Prior Approval Number:    Date Approved/Denied:   PASRR Number: [id number]  Discharge Plan: [name]    Current Diagnoses: Patient Active Problem List   Diagnosis Date Noted  . Ischemia of extremity 06/28/2017  . Ischemic leg 06/28/2017  . Pulmonary edema 06/27/2017  . Weakness of both legs 01/25/2017  . Hypertensive urgency 01/24/2017  . 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 Ischemic chest pain 11/01/2016  . Hypoxemia 09/07/2016  . Essential hypertension 03/08/2016  . Orthostatic hypotension 01/12/2016  . Dyslipidemia 01/12/2016  . Cough   . Weakness generalized 12/26/2015  . Chest pain 08/12/2015  . Accelerated hypertension 08/12/2015  . Carotid stenosis   . Stroke ([place]) 04/03/2015  . Abdominal aortic aneurysm ([place]) 03/06/2015  . Generalized weakness 09/08/2014  . Acute encephalopathy 09/07/2014  . Hypertensive emergency 07/05/2014  . Protein-calorie malnutrition, severe ([place]) 05/20/2014  . Hypokalemia 05/19/2014  . Syncope and collapse 05/19/2014  . ESRD on hemodialysis ([place]) 05/19/2014  . Anemia of renal disease 05/19/2014  . Coronary artery disease, non-occlusive:  09/28/2012  . Anemia 09/27/2012  . Secondary hyperparathyroidism ([place]) 09/27/2012  . Non compliance with medical treatment 09/27/2012  . Chronic combined systolic and diastolic CHF (congestive heart failure) ([place]) 10/10/2011  . A-fib ([place]) 10/08/2011  .  History of stroke 10/07/2011  . Cocaine abuse 10/07/2011  . Severe sinus bradycardia 10/07/2011  . Nonischemic cardiomyopathy ([place]) 09/18/2011  . Tobacco use disorder 08/07/2011  . Bruit 08/07/2011    Orientation RESPIRATION BLADDER Height & Weight     Self, Time, Situation, Place  Normal Continent Weight: 133 lb 2.5 oz (60.4 kg) Height:  5\' 10"  (177.8 cm)  BEHAVIORAL SYMPTOMS/MOOD NEUROLOGICAL BOWEL NUTRITION STATUS      Continent Diet (NPO )  AMBULATORY STATUS COMMUNICATION OF NEEDS Skin   Limited Assist Verbally Normal                       Personal Care Assistance Level of Assistance  Bathing, Feeding, Dressing Bathing Assistance: Limited assistance Feeding assistance: Independent (NPO at this time) Dressing Assistance: Limited assistance     Functional Limitations Info  Sight, Hearing, Speech Sight Info: Adequate Hearing Info: Adequate Speech Info: Adequate    SPECIAL CARE FACTORS FREQUENCY  OT (By licensed OT), PT (By licensed PT)     PT Frequency: 5x wk OT Frequency: 5x wk            Contractures Contractures Info: Not present    Additional Factors Info  Code Status, Allergies Code Status Info: full code Allergies Info: NKA           Current Medications (06/30/2017):  This is the current hospital active medication list Current Facility-Administered Medications  Medication Dose Route Frequency Provider Last Rate Last Dose  . 0.9 %  sodium chloride infusion   Intravenous Once [name], Immunologist      . 0.9 %  sodium chloride infusion  Intravenous Once Roderic Palau, MD      . 0.9 %  sodium chloride infusion  500 mL Intravenous Once PRN Rhyne, Samantha J, PA-C      . acetaminophen (TYLENOL) tablet 650 mg  650 mg Oral Q4H PRN Rondel Jumbo, PA-C      . alum & mag hydroxide-simeth (MAALOX/MYLANTA) 200-200-20 MG/5ML suspension 15-30 mL  15-30 mL Oral Q2H PRN Rhyne, Samantha J, PA-C   30 mL at 06/29/17 1421  . amLODipine (NORVASC) tablet 10  mg  10 mg Oral QPM Rondel Jumbo, PA-C   10 mg at 06/29/17 1740  . aspirin chewable tablet 81 mg  81 mg Oral Daily Rondel Jumbo, PA-C   81 mg at 06/30/17 1610  . atorvastatin (LIPITOR) tablet 40 mg  40 mg Oral QHS Rondel Jumbo, PA-C   40 mg at 06/29/17 2114  . bisacodyl (DULCOLAX) suppository 10 mg  10 mg Rectal Daily PRN Rondel Jumbo, PA-C      . calcium acetate (PHOSLO) capsule 1,334 mg  1,334 mg Oral TID AC Rondel Jumbo, PA-C   1,334 mg at 06/30/17 9604  . clopidogrel (PLAVIX) tablet 75 mg  75 mg Oral Daily Gabriel Earing, PA-C   75 mg at 06/30/17 5409  . docusate sodium (COLACE) capsule 100 mg  100 mg Oral Daily Rhyne, Samantha J, PA-C   100 mg at 06/30/17 8119  . gabapentin (NEURONTIN) capsule 100 mg  100 mg Oral TID Sharene Butters E, PA-C   100 mg at 06/30/17 1478  . guaiFENesin-dextromethorphan (ROBITUSSIN DM) 100-10 MG/5ML syrup 15 mL  15 mL Oral Q4H PRN Rhyne, Samantha J, PA-C      . heparin injection 5,000 Units  5,000 Units Subcutaneous Q8H Rhyne, Hulen Shouts, PA-C   5,000 Units at 06/30/17 2956  . hydrALAZINE (APRESOLINE) injection 5 mg  5 mg Intravenous Q20 Min PRN Rhyne, Samantha J, PA-C      . isosorbide-hydrALAZINE (BIDIL) 20-37.5 MG per tablet 1 tablet  1 tablet Oral TID Florencia Reasons, MD   1 tablet at 06/29/17 2115  . labetalol (NORMODYNE,TRANDATE) injection 10 mg  10 mg Intravenous Q10 min PRN Rhyne, Samantha J, PA-C      . metoprolol tartrate (LOPRESSOR) injection 2-5 mg  2-5 mg Intravenous Q2H PRN Rhyne, Samantha J, PA-C      . metoprolol tartrate (LOPRESSOR) tablet 12.5 mg  12.5 mg Oral BID Florencia Reasons, MD      . morphine 2 MG/ML injection 2 mg  2 mg Intravenous Q2H PRN Rhyne, Samantha J, PA-C   2 mg at 06/28/17 2213  . multivitamin (RENA-VIT) tablet 1 tablet  1 tablet Oral QHS Elmarie Shiley, MD      . ondansetron [facility]) tablet 4 mg  4 mg Oral Q6H PRN Rondel Jumbo, PA-C       Or  . ondansetron (ZOFRAN) injection 4 mg  4 mg Intravenous Q6H PRN Sharene Butters E, PA-C    4 mg at 06/29/17 0449  . oxyCODONE-acetaminophen (PERCOCET/ROXICET) 5-325 MG per tablet 1-2 tablet  1-2 tablet Oral Q6H PRN Gabriel Earing, PA-C   1 tablet at 06/30/17 0852  . pantoprazole (PROTONIX) EC tablet 40 mg  40 mg Oral Daily Rhyne, Hulen Shouts, PA-C   40 mg at 06/30/17 2130  . phenol (CHLORASEPTIC) mouth spray 1 spray  1 spray Mouth/Throat PRN Rhyne, Samantha J, PA-C      . senna-docusate (Senokot-S) tablet 1 tablet  1 tablet Oral  QHS PRN Rondel Jumbo, PA-C      . sodium chloride flush (NS) 0.9 % injection 10-40 mL  10-40 mL Intracatheter Q12H Elwin Mocha, MD   10 mL at 06/29/17 2121  . sodium chloride flush (NS) 0.9 % injection 10-40 mL  10-40 mL Intracatheter PRN Elwin Mocha, MD         Discharge Medications: Please see discharge summary for a list of discharge medications.  Relevant Imaging Results:  Relevant Lab Results:   Additional Information SS# [id number]  Wende Neighbors, LCSW

## 2017-06-30 NOTE — Consult Note (Signed)
[facility] Surgery Consult Note  Trevor [name] Jun 17, 1946  [id number].    Requesting MD: Florencia Reasons Chief Complaint/Reason for Consult: Porcelain gallbladder  HPI:  Trevor [name] is a 71yo male PMH ESRD on HD currently admitted to [facility] for RLE pain and numbness. He underwent Right axillofemoral femorofemoral bypass 06/28/17 for RLE ischemia. 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 General surgery asked to consult. Patient denies any abdominal pain. He has had some nausea since surgery on 7/29, but denies any nausea with PO intake prior to this. States that he typically has a BM every day. Denies diarrhea, constipation, dysuria, dyspepsia, weight loss.  PMH significant for ESRD on HD, CHF, HTN, HLD, H/o CVA, Hepatitis C Abdominal surgical history: none Smokes 1/2 PPD Retired  ROS: Review of Systems  Constitutional: Negative.   HENT: Negative.   Eyes: Negative.   Respiratory: Negative.   Cardiovascular: Negative.   Gastrointestinal: Positive for nausea. Negative for abdominal pain, blood in stool, constipation, diarrhea, heartburn, melena and vomiting.  Genitourinary: Negative.   Musculoskeletal: Positive for joint pain.  Skin: Negative.   Neurological: Negative.   All systems reviewed and otherwise negative except for as above  Family History  Problem Relation Age of Onset  . Heart attack Mother        MI in her 55s  . Diabetes Mother   . Alcohol abuse Father   . Anesthesia problems Neg Hx   . Hypotension Neg Hx   . Malignant hyperthermia Neg Hx   . Pseudochol deficiency Neg Hx     Past Medical History:  Diagnosis Date  . AAA (abdominal aortic aneurysm) ([place])   . Anxiety   . Arthritis   . Blindness   . CHF (congestive heart failure) ([place])   . 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 ESRD on hemodialysis ([place])    started HD 2014-15  . Headache(784.0)   . HTN (hypertension)   .  Protein-calorie malnutrition ([place])   . Stroke [facility])     Past Surgical History:  Procedure Laterality Date  . AXILLARY-FEMORAL BYPASS GRAFT  06/28/2017   Procedure: BYPASS GRAFT RIGHT AXILLA-BIFEMORAL USING 8MM X 30CM AND 8MM X 60CM HEMASHIELD GOLD GRAFTS;  Surgeon: Rosetta Posner, MD;  Location: [place];  Service: Vascular;;  . CARDIAC CATHETERIZATION N/A 11/01/2016   Procedure: Left Heart Cath and Coronary Angiography;  Surgeon: Lorretta Harp, MD;  Location: [place] CV LAB;  Service: Cardiovascular;  Laterality: N/A;  . ENDARTERECTOMY FEMORAL Bilateral 06/28/2017   Procedure: ENDARTERECTOMY BILATERAL FEMORAL ARTERIES;  Surgeon: Rosetta Posner, MD;  Location: [place];  Service: Vascular;  Laterality: Bilateral;  . INSERTION OF DIALYSIS CATHETER  09/21/2012   Procedure: INSERTION OF DIALYSIS CATHETER;  Surgeon: Angelia Mould, MD;  Location: [place];  Service: Vascular;  Laterality: N/A;  Right Internal Jugular Placement  . LEFT HEART CATHETERIZATION WITH CORONARY ANGIOGRAM N/A 09/28/2012   Procedure: LEFT HEART CATHETERIZATION WITH CORONARY ANGIOGRAM;  Surgeon: Sherren Mocha, MD;  Location: [facility] CATH LAB;  Service: Cardiovascular;  Laterality: N/A;  . LEFT HEART CATHETERIZATION WITH CORONARY ANGIOGRAM N/A 07/07/2014   Procedure: LEFT HEART CATHETERIZATION WITH CORONARY ANGIOGRAM;  Surgeon: Leonie Man, MD;  Location: [facility] CATH LAB;  Service: Cardiovascular;  Laterality: N/A;  . right hand    . TEE WITHOUT CARDIOVERSION  10/09/2011   Procedure: TRANSESOPHAGEAL ECHOCARDIOGRAM (TEE);  Surgeon: Lelon Perla, MD;  Location: [facility] ENDOSCOPY;  Service: Cardiovascular;  Laterality: N/A;    Social History:  reports that he has been smoking Cigarettes.  He has a 25.00 pack-year smoking history. He has never used smokeless tobacco. He reports that he does not drink alcohol or use drugs.  Allergies: No Known Allergies  Medications Prior to Admission  Medication Sig Dispense Refill  . acetaminophen  (TYLENOL) 325 MG tablet Take 2 tablets (650 mg total) by mouth every 4 (four) hours as needed for headache or mild pain.    Marland Kitchen amLODipine (NORVASC) 10 MG tablet Take 10 mg by mouth every evening.  0  . atorvastatin (LIPITOR) 40 MG tablet Take 40 mg by mouth at bedtime.    Marland Kitchen NIFEdipine (PROCARDIA-XL/ADALAT CC) 60 MG 24 hr tablet Take 1 tablet (60 mg total) by mouth daily.    . Omega-3 Fatty Acids (FISH OIL) 1000 MG CAPS Take 1,000 mg by mouth 2 (two) times daily.    Marland Kitchen aspirin 81 MG chewable tablet Chew 1 tablet (81 mg total) by mouth daily.    . calcium acetate (PHOSLO) 667 MG capsule Take 1,334 mg by mouth 3 (three) times daily before meals.     . clopidogrel (PLAVIX) 75 MG tablet Take 1 tablet (75 mg total) by mouth daily. 30 tablet 2  . docusate sodium (COLACE) 100 MG capsule Take 200 mg by mouth daily.     . Elbasvir-Grazoprevir (ZEPATIER) 50-100 MG TABS Take 1 tablet by mouth daily.    Marland Kitchen gabapentin (NEURONTIN) 100 MG capsule Take 1 capsule (100 mg total) by mouth 3 (three) times daily. 90 capsule 2  . isosorbide-hydrALAZINE (BIDIL) 20-37.5 MG tablet Take 1 tablet by mouth 3 (three) times daily. 90 tablet 5  . metoprolol tartrate (LOPRESSOR) 25 MG tablet Take 1 tablet (25 mg total) by mouth 2 (two) times daily. 60 tablet 5  . Nutritional Supplements (FEEDING SUPPLEMENT, NEPRO CARB STEADY,) LIQD Take 237 mLs by mouth 2 (two) times daily between meals.  0    Prior to Admission medications   Medication Sig Start Date End Date Taking? Authorizing Provider  acetaminophen (TYLENOL) 325 MG tablet Take 2 tablets (650 mg total) by mouth every 4 (four) hours as needed for headache or mild pain. 12/11/16  Yes Vann, Jessica U, DO  amLODipine (NORVASC) 10 MG tablet Take 10 mg by mouth every evening. 04/13/17  Yes [provider]  atorvastatin (LIPITOR) 40 MG tablet Take 40 mg by mouth at bedtime.   Yes [provider]  NIFEdipine (PROCARDIA-XL/ADALAT CC) 60 MG 24 hr tablet Take 1 tablet (60  mg total) by mouth daily. 12/12/16  Yes Vann, Tomi Bamberger, DO  Omega-3 Fatty Acids (FISH OIL) 1000 MG CAPS Take 1,000 mg by mouth 2 (two) times daily.   Yes [provider]  aspirin 81 MG chewable tablet Chew 1 tablet (81 mg total) by mouth daily. 11/04/16   Lyda Jester M, PA-C  calcium acetate (PHOSLO) 667 MG capsule Take 1,334 mg by mouth 3 (three) times daily before meals.     [provider]  clopidogrel (PLAVIX) 75 MG tablet Take 1 tablet (75 mg total) by mouth daily. 04/05/15   Barton Dubois, MD  docusate sodium (COLACE) 100 MG capsule Take 200 mg by mouth daily.     [provider]  Elbasvir-Grazoprevir (ZEPATIER) 50-100 MG TABS Take 1 tablet by mouth daily.    [provider]  gabapentin (NEURONTIN) 100 MG capsule Take 1 capsule (100 mg total) by  mouth 3 (three) times daily. 04/05/15   Barton Dubois, MD  isosorbide-hydrALAZINE (BIDIL) 20-37.5 MG tablet Take 1 tablet by mouth 3 (three) times daily. 11/03/16   Lyda Jester M, PA-C  metoprolol tartrate (LOPRESSOR) 25 MG tablet Take 1 tablet (25 mg total) by mouth 2 (two) times daily. 11/03/16   Lyda Jester M, PA-C  Nutritional Supplements (FEEDING SUPPLEMENT, NEPRO CARB STEADY,) LIQD Take 237 mLs by mouth 2 (two) times daily between meals. 12/11/16   Geradine Girt, DO    Blood pressure (!) 94/44, pulse 76, temperature 99.8 F (37.7 C), temperature source Oral, resp. rate 18, height 5' 10"  (1.778 m), weight 133 lb 2.5 oz (60.4 kg), SpO2 94 %. Physical Exam: General: pleasant, WD/WN AA male who is laying in bed in NAD HEENT: head is normocephalic, atraumatic.  Sclera are noninjected.  Pupils equal and round.  Ears and nose without any masses or lesions.  Mouth is pink and moist. Dentition fair Heart: regular, rate, and rhythm.  No obvious murmurs, gallops, or rubs noted.  Bilateral feet WWP Lungs: CTAB, no wheezes, rhonchi, or rales noted.  Respiratory effort nonlabored Abd: soft, NT/ND, +BS, no  masses, hernias, or organomegaly. Negative Murphy sign MS: all 4 extremities are symmetrical with no cyanosis, clubbing, or edema. Skin: warm and dry with no masses, lesions, or rashes Psych: A&Ox3 with an appropriate affect. Neuro: cranial nerves grossly intact, extremity CSM intact bilaterally, normal speech  Results for orders placed or performed during the hospital encounter of 06/28/17 (from the past 48 hour(s))  CBC with Differential/Platelet     Status: Abnormal   Collection Time: 06/28/17 11:25 AM  Result Value Ref Range   WBC 6.6 4.0 - 10.5 K/uL   RBC 3.16 (L) 4.22 - 5.81 MIL/uL   Hemoglobin 9.3 (L) 13.0 - 17.0 g/dL   HCT 27.5 (L) 39.0 - 52.0 %   MCV 87.0 78.0 - 100.0 fL   MCH 29.4 26.0 - 34.0 pg   MCHC 33.8 30.0 - 36.0 g/dL   RDW 14.0 11.5 - 15.5 %   Platelets 151 150 - 400 K/uL   Neutrophils Relative % 58 %   Neutro Abs 3.9 1.7 - 7.7 K/uL   Lymphocytes Relative 26 %   Lymphs Abs 1.7 0.7 - 4.0 K/uL   Monocytes Relative 9 %   Monocytes Absolute 0.6 0.1 - 1.0 K/uL   Eosinophils Relative 6 %   Eosinophils Absolute 0.4 0.0 - 0.7 K/uL   Basophils Relative 1 %   Basophils Absolute 0.1 0.0 - 0.1 K/uL  Comprehensive metabolic panel     Status: Abnormal   Collection Time: 06/28/17 11:25 AM  Result Value Ref Range   Sodium 139 135 - 145 mmol/L   Potassium 3.5 3.5 - 5.1 mmol/L   Chloride 98 (L) 101 - 111 mmol/L   CO2 24 22 - 32 mmol/L   Glucose, Bld 140 (H) 65 - 99 mg/dL   BUN 14 6 - 20 mg/dL   Creatinine, Ser 6.12 (H) 0.61 - 1.24 mg/dL    Comment: DELTA CHECK NOTED   Calcium 9.5 8.9 - 10.3 mg/dL   Total Protein 6.9 6.5 - 8.1 g/dL   Albumin 3.5 3.5 - 5.0 g/dL   AST 16 15 - 41 U/L   ALT 8 (L) 17 - 63 U/L   Alkaline Phosphatase 73 38 - 126 U/L   Total Bilirubin 0.9 0.3 - 1.2 mg/dL   GFR calc non Af Amer 8 (L) >60 mL/min  GFR calc Af Amer 10 (L) >60 mL/min    Comment: (NOTE) The eGFR has been calculated using the CKD EPI equation. This calculation has not been  validated in all clinical situations. eGFR's persistently <60 mL/min signify possible Chronic Kidney Disease.    Anion gap 17 (H) 5 - 15  CK     Status: None   Collection Time: 06/28/17 11:25 AM  Result Value Ref Range   Total CK 79 49 - 397 U/L  I-stat troponin, ED     Status: None   Collection Time: 06/28/17 11:32 AM  Result Value Ref Range   Troponin i, poc 0.04 0.00 - 0.08 ng/mL   Comment 3            Comment: Due to the release kinetics of cTnI, a negative result within the first hours of the onset of symptoms does not rule out myocardial infarction with certainty. If myocardial infarction is still suspected, repeat the test at appropriate intervals.   I-stat chem 8, ed     Status: Abnormal   Collection Time: 06/28/17 11:33 AM  Result Value Ref Range   Sodium 140 135 - 145 mmol/L   Potassium 3.4 (L) 3.5 - 5.1 mmol/L   Chloride 98 (L) 101 - 111 mmol/L   BUN 14 6 - 20 mg/dL   Creatinine, Ser 6.40 (H) 0.61 - 1.24 mg/dL   Glucose, Bld 138 (H) 65 - 99 mg/dL   Calcium, Ion 1.05 (L) 1.15 - 1.40 mmol/L   TCO2 27 0 - 100 mmol/L   Hemoglobin 9.2 (L) 13.0 - 17.0 g/dL   HCT 27.0 (L) 39.0 - 52.0 %  Type and screen [place]     Status: None   Collection Time: 06/28/17  2:40 PM  Result Value Ref Range   ABO/RH(D) B POS    Antibody Screen NEG    Sample Expiration 07/01/2017    Unit Number [id number]    Blood Component Type RED CELLS,LR    Unit division 00    Status of Unit ISSUED,FINAL    Transfusion Status OK TO TRANSFUSE    Crossmatch Result Compatible    Unit Number [id number]    Blood Component Type RED CELLS,LR    Unit division 00    Status of Unit ISSUED,FINAL    Transfusion Status OK TO TRANSFUSE    Crossmatch Result Compatible   Prepare RBC     Status: None   Collection Time: 06/28/17  3:00 PM  Result Value Ref Range   Order Confirmation ORDER PROCESSED BY BLOOD BANK   I-STAT 4, (NA,K, GLUC, HGB,HCT)     Status: Abnormal   Collection  Time: 06/28/17  4:27 PM  Result Value Ref Range   Sodium 138 135 - 145 mmol/L   Potassium 3.8 3.5 - 5.1 mmol/L   Glucose, Bld 105 (H) 65 - 99 mg/dL   HCT 20.0 (L) 39.0 - 52.0 %   Hemoglobin 6.8 (LL) 13.0 - 17.0 g/dL   Comment NOTIFIED PHYSICIAN   Comprehensive metabolic panel     Status: Abnormal   Collection Time: 06/29/17  4:39 AM  Result Value Ref Range   Sodium 138 135 - 145 mmol/L   Potassium 4.4 3.5 - 5.1 mmol/L   Chloride 100 (L) 101 - 111 mmol/L   CO2 27 22 - 32 mmol/L   Glucose, Bld 129 (H) 65 - 99 mg/dL   BUN 24 (H) 6 - 20 mg/dL   Creatinine, Ser 7.22 (H) 0.61 -  1.24 mg/dL   Calcium 8.4 (L) 8.9 - 10.3 mg/dL   Total Protein 5.9 (L) 6.5 - 8.1 g/dL   Albumin 3.0 (L) 3.5 - 5.0 g/dL   AST 37 15 - 41 U/L   ALT 13 (L) 17 - 63 U/L   Alkaline Phosphatase 61 38 - 126 U/L   Total Bilirubin 0.8 0.3 - 1.2 mg/dL   GFR calc non Af Amer 7 (L) >60 mL/min   GFR calc Af Amer 8 (L) >60 mL/min    Comment: (NOTE) The eGFR has been calculated using the CKD EPI equation. This calculation has not been validated in all clinical situations. eGFR's persistently <60 mL/min signify possible Chronic Kidney Disease.    Anion gap 11 5 - 15  Protime-INR     Status: None   Collection Time: 06/29/17  4:39 AM  Result Value Ref Range   Prothrombin Time 14.0 11.4 - 15.2 seconds   INR 1.07    Ct Angio Ao+bifem W & Or Wo Contrast  Result Date: 06/28/2017 CLINICAL DATA:  ischemia of both lower extremities EXAM: CT ANGIOGRAPHY OF ABDOMINAL AORTA WITH ILIOFEMORAL RUNOFF TECHNIQUE: Multidetector CT imaging of the abdomen, pelvis and lower extremities was performed using the standard protocol during bolus administration of intravenous contrast. Multiplanar CT image reconstructions and MIPs were obtained to evaluate the vascular anatomy. CONTRAST:  Isovue 370 138m COMPARISON:  None. FINDINGS: VASCULAR Aorta: Extensive partially calcified atheromatous plaque throughout. Fusiform infrarenal aneurysm 3.2 x2.7 cm  maximum transverse dimensions, tapering to a diameter of 2 cm above the bifurcation. There is nonocclusive mural thrombus in the aneurysmal segment. No dissection or stenosis. Celiac: Partially calcified ostial plaque without significant stenosis. Distal branching patent. SMA: Scattered calcified plaque extending from the ostomy overlying the greater than 7 cm without high-grade stenosis. Classic distal branch anatomy. Renals: Single on the left, with calcified nonocclusive ostial plaque. Duplicated on the right, inferior dominant, with calcified ostial plaque resulting in a short segment stenosis just beyond the origin of probable hemodynamic significance, patent distally. IMA: Patent, arising just proximal to the aneurysmal segment of the aorta. RIGHT Lower Extremity Inflow: Fusiform dilatation common iliac artery up to 19 mm diameter with heavy calcified plaque. Origin stenosis of the internal iliac artery. Extensive calcified plaque through the external iliac with tandem areas of at least moderate stenosis in proximal and distal segments. Outflow: Heavy partially calcified eccentric plaque through the common femoral resulting in at least moderate stenosis. Deep femoral branches patent. SFA occlusion just beyond its origin extending through its distal extent. There is continuation of thrombosis through the popliteal artery, which has heavy wall calcification. No definite significant distal reconstituted segments are identified. Runoff: Scattered heavy calcified plaque. No definite reconstitution of tibial any significant named tibial arterial runoff segment although this may be a function of scan timing. LEFT Lower Extremity Inflow: Ectatic proximal common iliac artery up to 14 mm, tapering to a normal caliber distally. Heavily calcified eccentric plaque through the internal and external iliac arteries resulting in tandem areas of at least mild stenosis. Outflow: Calcified plaque through the common femoral artery  resulting in at least mild stenosis. Deep femoral branches patent. Heavily calcified plaque through the length of the superficial and popliteal arteries, with multiple tandem segments of severe stenosis versus segmental occlusion. Runoff: Heavily calcified plaque in the tibioperoneal trunk. Anterior posterior tibial artery is occluded above mid calf. The peroneal artery is seen patent to just above the ankle. No significant reconstituted segments in the foot,  although this may be a function of scan timing. Veins: No obvious venous abnormality within the limitations of this arterial phase study. Review of the MIP images confirms the above findings. NON-VASCULAR Lower chest: Small bilateral pleural effusions. Dependent atelectasis posteriorly in both lower lobes. Bilateral emphysematous changes. Hepatobiliary: Coarse calcifications throughout the distended gallbladder. There is central intrahepatic biliary ductal prominence well as prominence of the CBD, which can be seen down to the ampulla. No focal liver lesion. Pancreas: Mild ductal dilatation up to 5 mm, seen down to the level of the ampulla. No discrete mass. No adjacent inflammatory changes. Spleen: Normal in size without focal abnormality. Adrenals/Urinary Tract: Normal adrenals. Scattered parenchymal calcifications in both kidneys may be vascular. Low-attenuation lesions in the right kidney possibly cysts but incompletely evaluated. No hydronephrosis. Urinary bladder is incompletely distended. Stomach/Bowel: Stomach is partially distended by ingested material. Small bowel is nondilated. Normal appendix. The colon is nondilated, unremarkable. Lymphatic: No abdominal or pelvic adenopathy identified. Reproductive: Marked prostatic enlargement protruding into the lumen of the urinary bladder. Other: No ascites.  No free air. Musculoskeletal: Schmorl's node in the inferior endplate of L3. No fracture or worrisome bone lesion. IMPRESSION: VASCULAR 1. 3.2 cm  infrarenal abdominal aortic aneurysm with ectatic common iliac arteries, 1.9 cm right and 1.4 cm left. Recommend followup by ultrasound in 3 years. This recommendation follows ACR consensus guidelines: White Paper of the ACR Incidental Findings Committee II on Vascular Findings. J Am Coll Radiol 2013; 37:628-315 2. Tandem areas of stenosis in the RIGHT external iliac artery, of probable hemodynamic significance. 3. Long segment occlusion of the RIGHT superficial femoral and popliteal arteries, with no identified distal collateral reconstitution of tibial arterial segments. 4. Tandem areas of stenosis in the LEFT external iliac artery, of possible hemodynamic significance. 5. Heavy atheromatous plaque through the LEFT superficial femoral and popliteal arteries with tandem areas of severe stenosis or segmental occlusion, with patent peroneal runoff. NON-VASCULAR 1. 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 Surgical consultation suggested. 2. Pancreatic duct dilatation and mild prominence of the central biliary tree through the CBD. If there is clinical or laboratory evidence of obstruction, consider ERCP or MRCP to exclude ampullary lesion. 3. Bilateral pleural effusions 4. Marked prostatic enlargement Electronically Signed   By: Lucrezia Europe M.D.   On: 06/28/2017 14:07   Dg Chest Port 1 View  Result Date: 06/29/2017 CLINICAL DATA:  Central line placement EXAM: PORTABLE CHEST 1 VIEW COMPARISON:  06/27/2017 FINDINGS: Interval placement of left central venous catheter with tip over the mid SVC region likely at the junction of the SVC and brachiocephalic vein. No pneumothorax. Mild cardiac enlargement with mild pulmonary vascular congestion. Interstitial pattern to the lungs suggesting edema. Similar appearance to previous study. Small amount of fluid in the right minor fissure but no pleural effusions identified. No pneumothorax. Calcified and tortuous aorta. Degenerative changes in the spine.  IMPRESSION: Left central venous catheter tip projected over the mid SVC at the junction of the SVC and brachiocephalic vein. Cardiac enlargement with mild pulmonary vascular congestion interstitial edema. Electronically Signed   By: Lucienne Capers M.D.   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 If negative, can plan for outpatient follow-up with Dr. Barry Dienes to discuss cholecystectomy in the setting of possible gallbladder carcinoma.   Wellington [place], [facility] Surgery 06/30/2017, 9:47 AM Pager: [phone] Consults: [phone] Mon-Fri 7:00 am-4:30 pm Sat-Sun 7:00 am-11:30 am

## 2017-06-30 NOTE — Progress Notes (Signed)
VASCULAR LAB PRELIMINARY  ARTERIAL  ABI completed: Previous ABI obtained on 08/07/15 demonstrated values of 0.68 on the right and 0.76 on the left. Right ABI of 0.11 is suggestive of critical arterial occlusive disease at rest. Left ABI of 0.38 is severe of severe arterial occlusive disease at rest. Unable to obtain toe pressures due to low amplitude waveforms bilaterally.   RIGHT    LEFT    PRESSURE WAVEFORM  PRESSURE WAVEFORM  BRACHIAL 149 Triphasic BRACHIAL HD Access   AT   AT 54 Monophasic  PT 16 Monophasic PT 56 Monophasic  GREAT TOE  NA GREAT TOE  NA    RIGHT LEFT  ABI 0.11 0.38   Results were given to the patient's nurse, Estill Bamberg.  Legrand Como, RVT 06/30/2017, 2:27 PM

## 2017-06-30 NOTE — Plan of Care (Signed)
Problem: Safety: Goal: Ability to remain free from injury will improve Outcome: Progressing Call bell within reach, bed in lowest position

## 2017-06-30 NOTE — Progress Notes (Signed)
BP reassessed via small adult cuff on R upper arm.  Value continues to be consistent with arterial line reading (see flowsheet).

## 2017-06-30 NOTE — Progress Notes (Addendum)
  Progress Note  SUBJECTIVE:    POD #2  Right foot numbness better. Having a little bit of right shin pain.   OBJECTIVE:   Vitals:   06/30/17 0425 06/30/17 0518  BP: (!) 151/49 (!) 140/47  Pulse: 72 70  Resp: 19 16  Temp: 98.3 F (36.8 C) 98.4 F (36.9 C)    Intake/Output Summary (Last 24 hours) at 06/30/17 0742 Last data filed at 06/30/17 0425  Gross per 24 hour  Intake              240 ml  Output             1000 ml  Net             -760 ml   Right infraclavicular incision c/d/i without hematoma Right groin incision c/d/i without hematoma Palpable ax-fem graft pulse. PT signals bilaterally.   ASSESSMENT/PLAN:   71 y.o. male is s/p: right axillofemoral femorofemoral bypass with 8 mm Hemashield graft 2 Days Post-Op   Palpable right ax-fem graft pulse. PT signals bilaterally. Explained that he will still have some numbness right foot and pain secondary to prolonged ischemia.  PT/OT recommending [name]. Stable from vascular standpoint.    Alvia Grove 06/30/2017 7:42 AM -- LABS:   CBC    Component Value Date/Time   WBC 6.6 06/28/2017 1125   HGB 6.8 (LL) 06/28/2017 1627   HCT 20.0 (L) 06/28/2017 1627   PLT 151 06/28/2017 1125    BMET    Component Value Date/Time   NA 138 06/29/2017 0439   K 4.4 06/29/2017 0439   CL 100 (L) 06/29/2017 0439   CO2 27 06/29/2017 0439   GLUCOSE 129 (H) 06/29/2017 0439   BUN 24 (H) 06/29/2017 0439   CREATININE 7.22 (H) 06/29/2017 0439   CALCIUM 8.4 (L) 06/29/2017 0439   GFRNONAA 7 (L) 06/29/2017 0439   GFRAA 8 (L) 06/29/2017 0439    COAG Lab Results  Component Value Date   INR 1.07 06/29/2017   INR 1.06 01/24/2017   INR 0.99 11/01/2016   No results found for: PTT  ANTIBIOTICS:   Anti-infectives    Start     Dose/Rate Route Frequency Ordered Stop   06/29/17 0300  cefUROXime (ZINACEF) 1.5 g in dextrose 5 % 50 mL IVPB     1.5 g 100 mL/hr over 30 Minutes Intravenous Every 12 hours 06/28/17 2129 06/29/17 0324     06/28/17 1330  Elbasvir-Grazoprevir 50-100 MG TABS 1 tablet  Status:  Discontinued     1 tablet Oral Daily 06/28/17 1339 06/29/17 0844       Virgina Jock, PA-C [facility] Office: [phone] Pager: [phone] 06/30/2017 7:42 AM   I have examined the patient, reviewed and agree with above.Axillary and groin wounds healing. Easily palpable axillofemoral and femorofemoral graft pulse. Still with decreased motor function in his right foot. Sensory function has returned. Pain is resolved. Patient had prolonged preoperative ischemia with obvious neurologic deficit resulting from this. Continue to work with physical therapy. No evidence of compartment syndrome.  Curt Jews, MD 06/30/2017 9:45 AM

## 2017-06-30 NOTE — Progress Notes (Signed)
PROGRESS NOTE  Trevor [name] BPZ:[id number] DOB: 08/07/1946 DOA: 06/28/2017 PCP: [name]  Brief Summary:   H/o ESRD on HD, Presented with right leg pain found to have RLE ischemia, s/p urgent AoBF Bypass Graft by VVS. general surgery consulted for porcelain gallbladder (indcidental finding on CT ), outpatient follow up with general surgery D/c to snf at discharge when bed available, needs vascular surgery clearance.    HPI/Recap of past 24 hours:  He reports right leg numbness, denies leg pain,  nauseous has resolved, he is now tolerating soft diet  Cuff pressure is much lower than a line reading  Assessment/Plan: Active Problems:   Nonischemic cardiomyopathy (HCC)   History of stroke   Cocaine abuse   A-fib (HCC)   Chronic combined systolic and diastolic CHF (congestive heart failure) (HCC)   Anemia   Secondary hyperparathyroidism ([place])   Non compliance with medical treatment   Coronary artery disease, non-occlusive:    ESRD on hemodialysis ([place])   Anemia of renal disease   Protein-calorie malnutrition, severe (HCC)   Abdominal aortic aneurysm (HCC)   Pulmonary edema   Ischemia of extremity   Ischemic leg   Critical limb ischemia bilaterally right greater than left -he presented to the ED c/o right leg pain, in view of  of acute ischemia of the right lower extremity, with absent bilateral femoral pulses, with negative Doppler signals, is to undergo CT Angio with bilateral lower extremity run off,  -he received heparin drip and was brought to the OR on 7/29, s/p Right axillofemoral femorofemoral bypass with 8 mm Hemashield graft on 7/29 - currently on asa/plavix, neurontin, prophylaxis heparin, will follow vascular surgery recommendation  Chronic combined systolic and diastolic CHF    -last 2-D echo with EF 45 to 24%, grade 1 diastolic dysfunction, systolic function mildly reduced. -Tn 0.04 EKG SR  -cxr on admission with "Cardiac enlargement with mild  pulmonary vascular congestion interstitial edema"  -continue lopressor/bidil, No edema on exam, Volume management per nephrology   ESRD on HD MWF    Renal Diet. Follow  Nephrology recommendations   Anemia of chronic disease Hemoglobin on admission 9.2 at BL .  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 - lft wnl, he denies ab pain, + nausea on 7/30 has resolved, no vomiting -general surgery consulted, recommend outpatient follow up with Dr Barry Dienes to discuss findings.  Pancreatic duct dilatation and mild prominence of the central biliary tree through the CBD.  -Incidental findings on ct scan -lft unremarkable, he denies ab pain, + nausea, no vomiting. -defer to outpatient MRCP to exclude ampullary lesion.   FTT; PT eval,  need snf placement  DVT prophylaxis: Heparin per Pharmacy  Code Status:   Full    Family Communication:  Discussed with patient Disposition Plan: [name] Consults called:    VVS , general surgery, nephrology   Procedures: -Right axillofemoral femorofemoral bypass with 8 mm Hemashield graft on 7/29 -Aline placement on  7/29 -Left IJ placement on 7/29, removal on 7/31 -dialysis (MWF)   Antibiotics:  Perioperative zinacef   Objective: BP (!) 94/44 (BP Location: Right Arm)   Pulse 76   Temp 99.8 F (37.7 C) (Oral)   Resp 18   Ht 5\' 10"  (1.778 m)   Wt 60.4 kg (133 lb 2.5 oz)   SpO2 94%    BMI 19.11 kg/m   Intake/Output Summary (Last 24 hours) at 06/30/17 0939 Last data filed at 06/30/17 0425  Gross per 24 hour  Intake                0 ml  Output             1000 ml  Net            -1000 ml   Filed Weights   06/29/17 0358 06/30/17 0030 06/30/17 0425  Weight: 58.7 kg (129 lb 8 oz) 61.4 kg (135 lb 5.8 oz) 60.4 kg (133 lb 2.5 oz)    Exam:   General:  NAD, poor vision at baseline, Both groin incisions and his axillary incision, clean.  right a line in place.  Cardiovascular: RRR  Respiratory: diminished at basis, no wheezing, no rhonchi  Abdomen: Soft/ND/NT, positive BS  Musculoskeletal: No Edema, right lower extremity cooler than left lower extremity, decreased sensation right lower extremity  Neuro: aaox3  Data Reviewed: Basic Metabolic Panel:  Recent Labs Lab 06/27/17 0550 06/27/17 1302 06/28/17 1125 06/28/17 1133 06/28/17 1627 06/29/17 0439  NA 141 136 139 140 138 138  K 3.4* 3.2* 3.5 3.4* 3.8 4.4  CL 100* 97* 98* 98*  --  100*  CO2 28 30 24   --   --  27  GLUCOSE 132* 88 140* 138* 105* 129*  BUN 15 <5* 14 14  --  24*  CREATININE 6.14* 2.98* 6.12* 6.40*  --  7.22*  CALCIUM 8.4* 8.8* 9.5  --   --  8.4*   Liver Function Tests:  Recent Labs Lab 06/27/17 0550 06/27/17 1302 06/28/17 1125 06/29/17 0439  AST 17 13* 16 37  ALT 7* 6* 8* 13*  ALKPHOS 72 66 73 61  BILITOT 0.8 0.9 0.9 0.8  PROT 6.4* 6.7 6.9 5.9*  ALBUMIN 3.1* 3.2* 3.5 3.0*   No results for input(s): LIPASE, AMYLASE in the last 168 hours. No results for input(s): AMMONIA in the last 168 hours. CBC:  Recent Labs Lab 06/27/17 0550 06/27/17 1302 06/28/17 1125 06/28/17 1133 06/28/17 1627  WBC 5.7 3.7* 6.6  --   --   NEUTROABS 3.9 2.3 3.9  --   --   HGB 9.1* 8.8* 9.3* 9.2* 6.8*  HCT 27.7* 26.5* 27.5* 27.0* 20.0*  MCV 88.2 87.7 87.0  --   --   PLT 152 127* 151  --   --    Cardiac Enzymes:    Recent Labs Lab 06/28/17 1125  CKTOTAL 79   BNP (last 3 results)  Recent  Labs  01/25/17 0059 04/04/17 1720 06/27/17 0550  BNP 1,608.5* 1,151.0* >4,500.0*    ProBNP (last 3 results) No results for input(s): PROBNP in the last 8760 hours.  CBG: No results for input(s): GLUCAP in the last 168 hours.  No results found for this or any previous visit (from the past 240 hour(s)).   Studies: No results found.  Scheduled Meds: . amLODipine  10 mg Oral QPM  . aspirin  81 mg Oral Daily  . atorvastatin  40 mg Oral QHS  .  calcium acetate  1,334 mg Oral TID AC  . clopidogrel  75 mg Oral Daily  . docusate sodium  100 mg Oral Daily  . gabapentin  100 mg Oral TID  . heparin  5,000 Units Subcutaneous Q8H  . isosorbide-hydrALAZINE  1 tablet Oral TID  . metoprolol tartrate  12.5 mg Oral BID  . multivitamin  1 tablet Oral QHS  . pantoprazole  40 mg Oral Daily  . sodium chloride flush  10-40 mL Intracatheter Q12H    Continuous Infusions: . sodium chloride    . sodium chloride    . sodium chloride       Time spent: 71mins  Chayson Charters MD, PhD  [facility] Pager [phone]. If 7PM-7AM, please contact night-coverage at [email or web address], password [facility] 06/30/2017, 9:39 AM  LOS: 2 days

## 2017-06-30 NOTE — Progress Notes (Signed)
Spoke with Lennie Muckle, vascular PA regarding order to transfer pt to [place].  Pt currently still has arterial line, which requires him to be on stepdown unit.  BP assessed with automatic cuff on R arm and consistent with pressures recording via a-line.  She is defering to triad attending physician to make call to discontinue a-line.   Dr. Erlinda Hong notified.  She plans to round on pt soon to determine plan of care for today.

## 2017-06-30 NOTE — Progress Notes (Signed)
Patient ID: DAKAI BRAITHWAITE, male   DOB: 1946-04-03, 71 y.o.   MRN: [id number]  [facility] Progress Note   Assessment/ Plan:   1. Critical lower extremity ischemia: POD #2 s/p right axillofemoral and femoral-femoral bypass for revascularization of lower extremities. Clinically doing much better. Plan noted for physical therapy with anticipated slow recovery of his numbness. 2. ESRD: Continue hemodialysis on the usual Monday/Wednesday/Friday schedule-tolerated dialysis earlier today without problems. He is euvolemic on physical exam. 3. Anemia: Acute postoperative hemoglobin drop noted, will recheck hemoglobin levels again tomorrow with dialysis and order transfusion if remains <7.0. 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 5. Nutrition: Restart renal vitamin along with current renal diet. 6. Hypertension: Blood pressures currently acceptable, continue to monitor with recently restarted antihypertensives  Subjective:   Reports some soreness of both legs overnight-underwent hemodialysis earlier this morning between 1 AM and 5 AM.    Objective:   BP (!) 129/45 (BP Location: Other (Comment)) Comment (BP Location): ART  Pulse 76   Temp 99.8 F (37.7 C) (Oral)   Resp 13   Ht 5\' 10"  (1.778 m)   Wt 60.4 kg (133 lb 2.5 oz)   SpO2 95%   BMI 19.11 kg/m   Physical Exam: OAC:[id number] resting in bed.  UXN:ATFTD RRR, S1 and S2 with ejection systolic murmur Resp:CTA bilaterally, no rales/rhonchi. Surgical scars intact DUK:GURK, flat, non-tender, BS normal Ext:No LE edema. Legs are warm to touch.Left upper arm brachiocephalic fistula with thrill/bruit.  Labs: BMET  Recent Labs Lab 06/27/17 0550 06/27/17 1302 06/28/17 1125 06/28/17 1133 06/28/17 1627 06/29/17 0439  NA 141 136 139 140 138 138  K 3.4* 3.2* 3.5 3.4* 3.8 4.4  CL 100* 97* 98* 98*  --  100*  CO2 28 30 24   --   --  27  GLUCOSE 132* 88 140*  138* 105* 129*  BUN 15 <5* 14 14  --  24*  CREATININE 6.14* 2.98* 6.12* 6.40*  --  7.22*  CALCIUM 8.4* 8.8* 9.5  --   --  8.4*   CBC  Recent Labs Lab 06/27/17 0550 06/27/17 1302 06/28/17 1125 06/28/17 1133 06/28/17 1627  WBC 5.7 3.7* 6.6  --   --   NEUTROABS 3.9 2.3 3.9  --   --   HGB 9.1* 8.8* 9.3* 9.2* 6.8*  HCT 27.7* 26.5* 27.5* 27.0* 20.0*  MCV 88.2 87.7 87.0  --   --   PLT 152 127* 151  --   --    Medications:    . amLODipine  10 mg Oral QPM  . aspirin  81 mg Oral Daily  . atorvastatin  40 mg Oral QHS  . calcium acetate  1,334 mg Oral TID AC  . clopidogrel  75 mg Oral Daily  . docusate sodium  100 mg Oral Daily  . gabapentin  100 mg Oral TID  . heparin  5,000 Units Subcutaneous Q8H  . isosorbide-hydrALAZINE  1 tablet Oral TID  . metoprolol tartrate  25 mg Oral BID  . NIFEdipine  60 mg Oral Daily  . pantoprazole  40 mg Oral Daily  . sodium chloride flush  10-40 mL Intracatheter Q12H   Elmarie Shiley, MD 06/30/2017, 8:46 AM

## 2017-06-30 NOTE — Clinical Social Work Note (Signed)
Clinical Social Work Assessment  Patient Details  Name: Trevor [name] MRN: [id number] Date of Birth: 07-20-1946  Date of referral:  06/30/17               Reason for consult:  Discharge Planning, Facility Placement                Permission sought to share information with:  Family Supports Permission granted to share information::  Yes, Verbal Permission Granted  Name::     Esteban Kobashigawa  Agency::  snf  Relationship::  son  Contact Information:  [phone]  Housing/Transportation Living arrangements for the past 2 months:  [place] of Information:  Patient Patient Interpreter Needed:  None Criminal Activity/Legal Involvement Pertinent to Current Situation/Hospitalization:  No - Comment as needed Significant Relationships:  Warehouse manager, Adult Children, Other Family Members Lives with:  Self Do you feel safe going back to the place where you live?  Yes Need for family participation in patient care:  No (Coment)  Care giving concerns: No family at bedside. Patient stated he lives at home by himself and has an aide that comes into the home for a couples during the day, 4x week.  Social Worker assessment / plan: Holiday representative met patient at bedside to offer support and discuss patients discharge needs. Patient was very pleasant during assessment. Patient stated he was at [facility] in the past and would like another facility. CSW to complete necessary paperwork and initiate [name] search on patients behalf. CSW will make patient aware when bed offers are available  Employment status:  Retired Forensic scientist:  Medicare PT Recommendations:  [place] / Referral to community resources:  [place]  Patient/Family's Response to care:  Patient was very Patent attorney of CSW role in his care  Patient/Family's Understanding of and Emotional Response to Diagnosis, Current Treatment, and Prognosis:  Patient with good  understanding of current hospitalization. Patient is agreeable to discharge to [name] and will have aide support once patient returns home   Emotional Assessment Appearance:  Appears stated age Attitude/Demeanor/Rapport:  Other Affect (typically observed):  Appropriate, Stable, Pleasant Orientation:  Oriented to Place, Oriented to  Time, Oriented to Situation, Oriented to Self Alcohol / Substance use:  Not Applicable Psych involvement (Current and /or in the community):  No (Comment)  Discharge Needs  Concerns to be addressed:  No discharge needs identified Readmission within the last 30 days:  No Current discharge risk:  None Barriers to Discharge:  No Barriers Identified   Wende Neighbors, LCSW 06/30/2017, 12:40 PM

## 2017-07-01 DIAGNOSIS — I998 Other disorder of circulatory system: Secondary | ICD-10-CM

## 2017-07-01 LAB — CBC WITH DIFFERENTIAL/PLATELET
BASOS ABS: 0 10*3/uL (ref 0.0–0.1)
BASOS PCT: 1 %
Eosinophils Absolute: 0.3 10*3/uL (ref 0.0–0.7)
Eosinophils Relative: 5 %
HEMATOCRIT: 29 % — AB (ref 39.0–52.0)
HEMOGLOBIN: 9.5 g/dL — AB (ref 13.0–17.0)
Lymphocytes Relative: 16 %
Lymphs Abs: 1.1 10*3/uL (ref 0.7–4.0)
MCH: 28.4 pg (ref 26.0–34.0)
MCHC: 32.8 g/dL (ref 30.0–36.0)
MCV: 86.6 fL (ref 78.0–100.0)
MONOS PCT: 8 %
Monocytes Absolute: 0.6 10*3/uL (ref 0.1–1.0)
NEUTROS ABS: 4.9 10*3/uL (ref 1.7–7.7)
NEUTROS PCT: 70 %
Platelets: 124 10*3/uL — ABNORMAL LOW (ref 150–400)
RBC: 3.35 MIL/uL — AB (ref 4.22–5.81)
RDW: 14.7 % (ref 11.5–15.5)
WBC: 6.9 10*3/uL (ref 4.0–10.5)

## 2017-07-01 NOTE — Progress Notes (Signed)
CSW met patient at bedside to discuss disposition plan. Patient stated he would like to discharge to [place].  CSW spoke to facility and facility has bed offer for patient once his stable.  Rhea Pink, MSW,  [place]

## 2017-07-01 NOTE — Progress Notes (Signed)
PROGRESS NOTE  ANCHOR Trevor [name]:[id number] DOB: August 08, 1946 DOA: 06/28/2017 PCP: [name]  Brief Summary:   H/o ESRD on HD, Presented with right leg pain found to have RLE ischemia, s/p urgent AoBF Bypass Graft by VVS. general surgery consulted for porcelain gallbladder (indcidental finding on CT ), outpatient follow up with general surgery. D/c to snf at discharge when bed available, needs vascular surgery clearance.   HPI/Recap of past 24 hours: Patient has no new complaints. No acute issues overnight  Assessment/Plan: Active Problems:   Nonischemic cardiomyopathy (HCC)   History of stroke   Cocaine abuse   A-fib (HCC)   Chronic combined systolic and diastolic CHF (congestive heart failure) (HCC)   Anemia   Secondary hyperparathyroidism ([place])   Non compliance with medical treatment   Coronary artery disease, non-occlusive:    ESRD on hemodialysis ([place])   Anemia of renal disease   Protein-calorie malnutrition, severe (HCC)   Abdominal aortic aneurysm (HCC)   Pulmonary edema   Ischemia of extremity   Ischemic leg   Critical limb ischemia bilaterally right greater than left -he presented to the ED c/o right leg pain, in view of  of acute ischemia of the right lower extremity, with absent bilateral femoral pulses, with negative Doppler signals, is to undergo CT Angio with bilateral lower extremity run off,  -he received heparin drip and was brought to the OR on 7/29, s/p Right axillofemoral femorofemoral bypass with 8 mm Hemashield graft on 7/29 - currently on asa/plavix, neurontin, prophylaxis heparin, will follow vascular surgery recommendation  Chronic combined systolic and diastolic CHF   -last 2-D echo with EF 45 to 25%, grade 1 diastolic dysfunction, systolic function mildly reduced. -Tn 0.04 EKG SR  -cxr on admission with "Cardiac enlargement with mild pulmonary vascular congestion interstitial edema"  -continue lopressor/bidil, No edema on exam,  Volume management per nephrology  ESRD on HD MWF    Renal Diet. Follow  Nephrology recommendations  Anemia of chronic disease Hemoglobin on admission 9.2 at BL .  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 - lft wnl, he denies ab pain, + nausea on 7/30 has resolved, no vomiting -general surgery consulted plan is for outpatient follow up with Dr Barry Dienes to discuss findings.  Pancreatic duct dilatation and mild prominence of the central biliary tree through the CBD.  -Incidental findings on ct scan -lft unremarkable, he denies ab pain, + nausea, no vomiting. -defer to outpatient MRCP to exclude ampullary lesion.   FTT; PT eval,  need snf placement  DVT prophylaxis: Heparin per Pharmacy  Code Status:   Full    Family Communication:  Discussed with patient Disposition Plan: [name] Consults called:    VVS , general surgery, nephrology   Procedures: -Right axillofemoral femorofemoral bypass with 8 mm Hemashield graft on 7/29 -Aline placement on 7/29 -Left IJ placement on 7/29, removal on 7/31 -dialysis (MWF)   Antibiotics:  Perioperative zinacef   Objective:  BP (!) 133/45 (BP Location: Right Arm)   Pulse 68   Temp 98.4 F (36.9 C) (Oral)   Resp 17   Ht 5\' 10"  (1.778 m)   Wt 60.2 kg (132 lb 11.5 oz)   SpO2 98%   BMI 19.04 kg/m   Intake/Output Summary (Last 24 hours) at 07/01/17  1828 Last data filed at 07/01/17 1324  Gross per 24 hour  Intake                0 ml  Output                0 ml  Net                0 ml   Filed Weights   06/30/17 0030 06/30/17 0425 07/01/17 1515  Weight: 61.4 kg (135 lb 5.8 oz) 60.4 kg (133 lb 2.5 oz) 60.2 kg (132 lb 11.5 oz)    Exam:   General:  NAD, poor vision at baseline, Awake and alert  Cardiovascular: RRR, no rubs or gallops  Respiratory: diminished at basis, no wheezing, no rhonchi  Abdomen: Soft/ND/NT, positive BS  Musculoskeletal: No Edema, right lower extremity cooler than left lower extremity, decreased sensation right lower extremity  Neuro: aaox3, no facial asymmetry  Data Reviewed: Basic Metabolic Panel:  Recent Labs Lab 06/27/17 0550 06/27/17 1302 06/28/17 1125 06/28/17 1133 06/28/17 1627 06/29/17 0439  NA 141 136 139 140 138 138  K 3.4* 3.2* 3.5 3.4* 3.8 4.4  CL 100* 97* 98* 98*  --  100*  CO2 28 30 24   --   --  27  GLUCOSE 132* 88 140* 138* 105* 129*  BUN 15 <5* 14 14  --  24*  CREATININE 6.14* 2.98* 6.12* 6.40*  --  7.22*  CALCIUM 8.4* 8.8* 9.5  --   --  8.4*   Liver Function Tests:  Recent Labs Lab 06/27/17 0550 06/27/17 1302 06/28/17 1125 06/29/17 0439  AST 17 13* 16 37  ALT 7* 6* 8* 13*  ALKPHOS 72 66 73 61  BILITOT 0.8 0.9 0.9 0.8  PROT 6.4* 6.7 6.9 5.9*  ALBUMIN 3.1* 3.2* 3.5 3.0*   No results for input(s): LIPASE, AMYLASE in the last 168 hours. No results for input(s): AMMONIA in the last 168 hours. CBC:  Recent Labs Lab 06/27/17 0550 06/27/17 1302 06/28/17 1125 06/28/17 1133 06/28/17 1627 07/01/17 1028  WBC 5.7 3.7* 6.6  --   --  6.9  NEUTROABS 3.9 2.3 3.9  --   --  4.9  HGB 9.1* 8.8* 9.3* 9.2* 6.8* 9.5*  HCT 27.7* 26.5* 27.5* 27.0* 20.0* 29.0*  MCV 88.2 87.7 87.0  --   --  86.6  PLT 152 127* 151  --   --  124*   Cardiac Enzymes:    Recent Labs Lab 06/28/17 1125  CKTOTAL 79   BNP (last 3 results)  Recent Labs  01/25/17 0059 04/04/17 1720  06/27/17 0550  BNP 1,608.5* 1,151.0* >4,500.0*    ProBNP (last 3 results) No results for input(s): PROBNP in the last 8760 hours.  CBG: No results for input(s): GLUCAP in the last 168 hours.  No results found for this or any previous visit (from the past 240 hour(s)).   Studies: US Abdomen Limited Ruq  Result Date: 07/01/2017 CLINICAL DATA:  Abdominal pain. EXAM: ULTRASOUND ABDOMEN LIMITED RIGHT UPPER QUADRANT COMPARISON:  CT 06/28/2017. FINDINGS: Exam made available for my evaluation on today's date. Gallbladder: Sludge noted within the gallbladder.  Soft tissue mass in the gallbladder cannot be completely excluded. Echogenic gallbladder wall noted consistent with calcification within a porcelain gallbladder. Common bile duct: Diameter: 5.5 mm. Liver: Liver has a coarse echotexture with slightly nodular contour. Cirrhosis cannot be excluded. No focal hepatic abnormality identified. Portal vein is patent. IMPRESSION: 1. Porcelain gallbladder again noted. Sludge noted within the gallbladder. Soft tissue mass in the gallbladder cannot be excluded . No definite biliary distention noted on today's exam. 2. Liver has a coarse echotexture with slightly nodular contour. Cirrhosis cannot be excluded . Electronically Signed   By: Marcello Moores  Register   On: 07/01/2017 09:59    Scheduled Meds: . amLODipine  10 mg Oral QPM  . aspirin  81 mg Oral Daily  . atorvastatin  40 mg Oral QHS  . calcium acetate  1,334 mg Oral TID AC  . clopidogrel  75 mg Oral Daily  . docusate sodium  100 mg Oral Daily  . gabapentin  100 mg Oral TID  . heparin  5,000 Units Subcutaneous Q8H  . isosorbide-hydrALAZINE  1 tablet Oral TID  . metoprolol tartrate  12.5 mg Oral BID  . multivitamin  1 tablet Oral QHS  . pantoprazole  40 mg Oral Daily  . sodium chloride flush  10-40 mL Intracatheter Q12H    Continuous Infusions: . sodium chloride    . sodium chloride    . sodium chloride      Time spent: 35 mins  Velvet Bathe  MD, PhD  [facility] Pager [phone]. If 7PM-7AM, please contact night-coverage at [email or web address], password [facility] 07/01/2017, 6:28 PM  LOS: 3 days

## 2017-07-01 NOTE — Progress Notes (Addendum)
[facility] [place]  Subjective  - states his right leg still feels weaker and that he is having trouble moving it.   Objective (!) 157/56 74 98.2 F (36.8 C) (Axillary) 17 97%  Intake/Output Summary (Last 24 hours) at 07/01/17 0743 Last data filed at 06/30/17 1200  Gross per 24 hour  Intake              120 ml  Output                0 ml  Net              120 ml   ABI's 06/30/2017  Right post tibial!16 mm Hg!0.11          !Monophasic! +-----------------+--------+--------------+----------+ !Left ant tibial  !54 mm Hg!0.36          !Monophasic! +-----------------+--------+--------------+----------+ !Left post tibial !56 mm Hg!0.38          !Monophasic! +-----------------+--------+--------------+----------+  ------------------------------------------------------------------- Summary: Previous ABI obtained on 08/07/15 demonstrated values of 0.68 on the right and 0.76 on the left.  Right ABI of 0.11 is suggestive of critical arterial occlusive disease at rest. Left ABI of 0.38 is severe of severe arterial occlusive disease at rest. Unable to obtain toe pressures due to low amplitude waveforms Bilaterally.  Palpable graft pulses ax-fem and fem-fem. Incision healing well without hematomas. Feet warm and well perfused. PT signals B Active range of motion of LE intact, no active right toe movement   Assessment/Planning: 71 y.o. male is s/p: right axillofemoral femorofemoral bypass with 8 mm Hemashield graft 3 Days Post-Op  Grafts patent  ESRD: Continue hemodialysis on the usual Monday/Wednesday/Friday  [name] placement pending    [facility] 07/01/2017 7:43 AM --  Laboratory Lab Results:  Recent Labs  06/28/17 1125 06/28/17 1133 06/28/17 1627  WBC 6.6  --   --   HGB 9.3* 9.2* 6.8*  HCT 27.5* 27.0* 20.0*  PLT 151  --   --    BMET  Recent Labs  06/28/17 1125 06/28/17 1133 06/28/17 1627 06/29/17 0439  NA 139 140 138  138  K 3.5 3.4* 3.8 4.4  CL 98* 98*  --  100*  CO2 24  --   --  27  GLUCOSE 140* 138* 105* 129*  BUN 14 14  --  24*  CREATININE 6.12* 6.40*  --  7.22*  CALCIUM 9.5  --   --  8.4*    COAG Lab Results  Component Value Date   INR 1.07 06/29/2017   INR 1.06 01/24/2017   INR 0.99 11/01/2016   No results found for: PTT  I have examined the patient, reviewed and agree with above. No pain in his right foot. Some tenderness in his right calf. Sensation intact in his foot but still diminished motor function. Had prolonged ischemia of his right for worsen his left. Does have extensive SFA and tibial disease with really no revascularization options. Normal flow to the groin level with easily palpable axillofemoral and femorofemoral graft pulses.  Curt Jews, MD 07/01/2017 4:24 PM

## 2017-07-01 NOTE — Progress Notes (Signed)
Patient ID: Trevor [name], male   DOB: [birth year], 71 y.o.   MRN: [id number]  [facility] Progress Note   Assessment/ Plan:   1. Critical lower extremity ischemia: POD #3 s/p right axillofemoral and femoral-femoral bypass for revascularization of lower extremities. Clinically doing much better. Plan noted for DC to [name] when ready for continued rehabilitation. 2. ESRD: Continue hemodialysis on the usual Monday/Wednesday/Friday schedule. He is euvolemic on physical exam. 3. Anemia: Acute postoperative hemoglobin drop noted, will recheck hemoglobin levels again today with dialysis and order transfusion if remains <7.0. 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 5. Nutrition: Restart renal vitamin along with current renal diet. 6. Hypertension: Blood pressures currently acceptable, continue to monitor with recently restarted antihypertensives  Subjective:   Reports improving right leg pain--still has some numbness and weakness.    Objective:   BP (!) 166/57   Pulse 67   Temp 98.2 F (36.8 C) (Axillary)   Resp 17   Ht 5\' 10"  (1.778 m)   Wt 60.4 kg (133 lb 2.5 oz)   SpO2 97%   BMI 19.11 kg/m   Physical Exam: VCB:SWHQPRFFMBW resting in bed, watching TV.  GYK:ZLDJT RRR, S1 and S2 with ejection systolic murmur Resp:CTA bilaterally, no rales/rhonchi. Surgical scars intact TSV:XBLT, flat, non-tender, BS normal Ext:No LE edema. Legs are warm to touch.Left upper arm brachiocephalic fistula with thrill/bruit.  Labs: BMET  Recent Labs Lab 06/27/17 0550 06/27/17 1302 06/28/17 1125 06/28/17 1133 06/28/17 1627 06/29/17 0439  NA 141 136 139 140 138 138  K 3.4* 3.2* 3.5 3.4* 3.8 4.4  CL 100* 97* 98* 98*  --  100*  CO2 28 30 24   --   --  27  GLUCOSE 132* 88 140* 138* 105* 129*  BUN 15 <5* 14 14  --  24*  CREATININE 6.14* 2.98* 6.12* 6.40*  --  7.22*  CALCIUM 8.4* 8.8* 9.5  --   --  8.4*   CBC  Recent  Labs Lab 06/27/17 0550 06/27/17 1302 06/28/17 1125 06/28/17 1133 06/28/17 1627  WBC 5.7 3.7* 6.6  --   --   NEUTROABS 3.9 2.3 3.9  --   --   HGB 9.1* 8.8* 9.3* 9.2* 6.8*  HCT 27.7* 26.5* 27.5* 27.0* 20.0*  MCV 88.2 87.7 87.0  --   --   PLT 152 127* 151  --   --    Medications:    . amLODipine  10 mg Oral QPM  . aspirin  81 mg Oral Daily  . atorvastatin  40 mg Oral QHS  . calcium acetate  1,334 mg Oral TID AC  . clopidogrel  75 mg Oral Daily  . docusate sodium  100 mg Oral Daily  . gabapentin  100 mg Oral TID  . heparin  5,000 Units Subcutaneous Q8H  . isosorbide-hydrALAZINE  1 tablet Oral TID  . metoprolol tartrate  12.5 mg Oral BID  . multivitamin  1 tablet Oral QHS  . pantoprazole  40 mg Oral Daily  . sodium chloride flush  10-40 mL Intracatheter Q12H   Elmarie Shiley, MD 07/01/2017, 10:11 AM

## 2017-07-02 ENCOUNTER — Encounter: Payer: Self-pay | Admitting: Internal Medicine

## 2017-07-02 DIAGNOSIS — G629 Polyneuropathy, unspecified: Secondary | ICD-10-CM | POA: Diagnosis not present

## 2017-07-02 DIAGNOSIS — I4891 Unspecified atrial fibrillation: Secondary | ICD-10-CM | POA: Diagnosis not present

## 2017-07-02 DIAGNOSIS — R2689 Other abnormalities of gait and mobility: Secondary | ICD-10-CM | POA: Diagnosis not present

## 2017-07-02 DIAGNOSIS — M79675 Pain in left toe(s): Secondary | ICD-10-CM | POA: Diagnosis not present

## 2017-07-02 DIAGNOSIS — N2581 Secondary hyperparathyroidism of renal origin: Secondary | ICD-10-CM | POA: Diagnosis not present

## 2017-07-02 DIAGNOSIS — Z8673 Personal history of transient ischemic attack (TIA), and cerebral infarction without residual deficits: Secondary | ICD-10-CM | POA: Diagnosis not present

## 2017-07-02 DIAGNOSIS — R488 Other symbolic dysfunctions: Secondary | ICD-10-CM | POA: Diagnosis not present

## 2017-07-02 DIAGNOSIS — Z48812 Encounter for surgical aftercare following surgery on the circulatory system: Secondary | ICD-10-CM | POA: Diagnosis not present

## 2017-07-02 DIAGNOSIS — I1311 Hypertensive heart and chronic kidney disease without heart failure, with stage 5 chronic kidney disease, or end stage renal disease: Secondary | ICD-10-CM | POA: Diagnosis not present

## 2017-07-02 DIAGNOSIS — Z95828 Presence of other vascular implants and grafts: Secondary | ICD-10-CM

## 2017-07-02 DIAGNOSIS — D649 Anemia, unspecified: Secondary | ICD-10-CM | POA: Diagnosis not present

## 2017-07-02 DIAGNOSIS — I120 Hypertensive chronic kidney disease with stage 5 chronic kidney disease or end stage renal disease: Secondary | ICD-10-CM | POA: Diagnosis not present

## 2017-07-02 DIAGNOSIS — B351 Tinea unguium: Secondary | ICD-10-CM | POA: Diagnosis not present

## 2017-07-02 DIAGNOSIS — I5042 Chronic combined systolic (congestive) and diastolic (congestive) heart failure: Secondary | ICD-10-CM | POA: Diagnosis not present

## 2017-07-02 DIAGNOSIS — I998 Other disorder of circulatory system: Secondary | ICD-10-CM | POA: Diagnosis not present

## 2017-07-02 DIAGNOSIS — R5381 Other malaise: Secondary | ICD-10-CM | POA: Diagnosis not present

## 2017-07-02 DIAGNOSIS — F801 Expressive language disorder: Secondary | ICD-10-CM | POA: Diagnosis not present

## 2017-07-02 DIAGNOSIS — R7309 Other abnormal glucose: Secondary | ICD-10-CM | POA: Diagnosis not present

## 2017-07-02 DIAGNOSIS — R1312 Dysphagia, oropharyngeal phase: Secondary | ICD-10-CM | POA: Diagnosis not present

## 2017-07-02 DIAGNOSIS — I69398 Other sequelae of cerebral infarction: Secondary | ICD-10-CM | POA: Diagnosis not present

## 2017-07-02 DIAGNOSIS — Z452 Encounter for adjustment and management of vascular access device: Secondary | ICD-10-CM

## 2017-07-02 DIAGNOSIS — K828 Other specified diseases of gallbladder: Secondary | ICD-10-CM | POA: Diagnosis not present

## 2017-07-02 DIAGNOSIS — M6281 Muscle weakness (generalized): Secondary | ICD-10-CM | POA: Diagnosis not present

## 2017-07-02 DIAGNOSIS — M79674 Pain in right toe(s): Secondary | ICD-10-CM | POA: Diagnosis not present

## 2017-07-02 DIAGNOSIS — I2510 Atherosclerotic heart disease of native coronary artery without angina pectoris: Secondary | ICD-10-CM | POA: Diagnosis not present

## 2017-07-02 DIAGNOSIS — D638 Anemia in other chronic diseases classified elsewhere: Secondary | ICD-10-CM | POA: Diagnosis not present

## 2017-07-02 DIAGNOSIS — I429 Cardiomyopathy, unspecified: Secondary | ICD-10-CM | POA: Diagnosis not present

## 2017-07-02 DIAGNOSIS — D631 Anemia in chronic kidney disease: Secondary | ICD-10-CM | POA: Diagnosis not present

## 2017-07-02 DIAGNOSIS — Z992 Dependence on renal dialysis: Secondary | ICD-10-CM | POA: Diagnosis not present

## 2017-07-02 DIAGNOSIS — Z9189 Other specified personal risk factors, not elsewhere classified: Secondary | ICD-10-CM | POA: Diagnosis not present

## 2017-07-02 DIAGNOSIS — I10 Essential (primary) hypertension: Secondary | ICD-10-CM | POA: Diagnosis not present

## 2017-07-02 DIAGNOSIS — N186 End stage renal disease: Secondary | ICD-10-CM | POA: Diagnosis not present

## 2017-07-02 DIAGNOSIS — E43 Unspecified severe protein-calorie malnutrition: Secondary | ICD-10-CM | POA: Diagnosis not present

## 2017-07-02 MED ORDER — OXYCODONE-ACETAMINOPHEN 5-325 MG PO TABS: 1.0000 | ORAL_TABLET | Freq: Four times a day (QID) | ORAL | 0 refills | Status: DC | PRN

## 2017-07-02 MED ORDER — RENA-VITE PO TABS: 1.0000 | ORAL_TABLET | Freq: Every day | ORAL | 0 refills | Status: DC

## 2017-07-02 NOTE — Progress Notes (Signed)
A-line removed per order and per protocol. Pressure held for >58min. Small hematoma seemed to be forming <1 in. Pressure dressing applied. Will continue to monitor.

## 2017-07-02 NOTE — Progress Notes (Signed)
Patient ID: Trevor [name], male   DOB: [birth year], 71 y.o.   MRN: [id number]  [facility] Progress Note   Assessment/ Plan:   1. Critical lower extremity ischemia: POD #4 s/p right axillofemoral and femoral-femoral bypass for revascularization of lower extremities. Clinically improving but still not able to ambulate-prolonged/limited recovery anticipated based on vascular surgery. Plan noted for DC to [name] when ready for continued rehabilitation. 2. ESRD: Continue hemodialysis on the usual Monday/Wednesday/Friday schedule. Underwent hemodialysis yesterday without any problems-no acute indications for dialysis today. 3. Anemia: Hemoglobin level acceptable at this time, continue ESA with hemodialysis. 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 5. Nutrition: Restart renal vitamin along with current renal diet. 6. Hypertension: Blood pressures currently acceptable, we'll continue to monitor trend and limit hypotensive episodes.  Subjective:   Continues to have some intermittent right leg pain with numbness/weakness.    Objective:   BP 126/67 (BP Location: Right Wrist)   Pulse 82   Temp 97.6 F (36.4 C) (Oral)   Resp 18   Ht 5\' 10"  (1.778 m)   Wt 58.6 kg (129 lb 3 oz)   SpO2 96%   BMI 18.54 kg/m   Physical Exam: EXH:BZJIRCVELFY resting in bed.  BOF:BPZWC RRR, S1 and S2 with ejection systolic murmur Resp:CTA bilaterally, no rales/rhonchi. Surgical scars intact HEN:IDPO, flat, non-tender, BS normal Ext:No LE edema. Legs are warm to touch.Left upper arm brachiocephalic fistula with thrill/bruit.  Labs: BMET  Recent Labs Lab 06/27/17 0550 06/27/17 1302 06/28/17 1125 06/28/17 1133 06/28/17 1627 06/29/17 0439  NA 141 136 139 140 138 138  K 3.4* 3.2* 3.5 3.4* 3.8 4.4  CL 100* 97* 98* 98*  --  100*  CO2 28 30 24   --   --  27  GLUCOSE 132* 88 140* 138* 105* 129*  BUN 15 <5* 14 14  --   24*  CREATININE 6.14* 2.98* 6.12* 6.40*  --  7.22*  CALCIUM 8.4* 8.8* 9.5  --   --  8.4*   CBC  Recent Labs Lab 06/27/17 0550 06/27/17 1302 06/28/17 1125 06/28/17 1133 06/28/17 1627 07/01/17 1028  WBC 5.7 3.7* 6.6  --   --  6.9  NEUTROABS 3.9 2.3 3.9  --   --  4.9  HGB 9.1* 8.8* 9.3* 9.2* 6.8* 9.5*  HCT 27.7* 26.5* 27.5* 27.0* 20.0* 29.0*  MCV 88.2 87.7 87.0  --   --  86.6  PLT 152 127* 151  --   --  124*   Medications:    . amLODipine  10 mg Oral QPM  . aspirin  81 mg Oral Daily  . atorvastatin  40 mg Oral QHS  . calcium acetate  1,334 mg Oral TID AC  . clopidogrel  75 mg Oral Daily  . docusate sodium  100 mg Oral Daily  . gabapentin  100 mg Oral TID  . heparin  5,000 Units Subcutaneous Q8H  . isosorbide-hydrALAZINE  1 tablet Oral TID  . metoprolol tartrate  12.5 mg Oral BID  . multivitamin  1 tablet Oral QHS  . pantoprazole  40 mg Oral Daily  . sodium chloride flush  10-40 mL Intracatheter Q12H   Elmarie Shiley, MD 07/02/2017, 8:45 AM

## 2017-07-02 NOTE — Progress Notes (Signed)
Report called to [facility]. They had no further questions. [facility] called for transportation. Will continue to monitor.

## 2017-07-02 NOTE — Progress Notes (Signed)
Clinical Social Worker facilitated patient discharge including contacting patient family and facility to confirm patient discharge plans.  Clinical information faxed to facility and family agreeable with plan.  CSW arranged ambulance transport via [facility] to [facility].  RN to call [phone] (pt will be placed in rm [phone]) report prior to discharge.  Clinical Social Worker will sign off for now as social work intervention is no longer needed. Please consult [place] again if new need arises.  Rhea Pink, MSW, [place]

## 2017-07-02 NOTE — Care Management Important Message (Signed)
Important Message  Patient Details  Name: Trevor [name] MRN: [id number] Date of Birth: [birth year]   Medicare Important Message Given:  Yes    Nathen May 07/02/2017, 10:29 AM

## 2017-07-02 NOTE — Care Management Note (Signed)
Case Management Note [name] RN, BSN Unit 4E-Case Manager [phone]  Patient Details  Name: Trevor [name] MRN: [id number] Date of Birth: [birth year]  Subjective/Objective:   Pt admitted with RLE ischemia s/p urgent AoBF bypass graft per VSS                 Action/Plan: PTA Pt lived at home- per PT eval recommendations for [name]- CSW following for placement needs  Expected Discharge Date:  07/02/17               Expected Discharge Plan:  [place]  In-House Referral:  Clinical Social Work  Discharge planning Services  CM Consult  Post Acute Care Choice:  NA Choice offered to:  NA  DME Arranged:    DME Agency:     HH Arranged:    [place] Agency:     Status of Service:  Completed, signed off  If discussed at [employer] of Stay Meetings, dates discussed:    Discharge Disposition: skilled facility   Additional Comments:  07/02/17- 1645- pt for d/c to [name] today- plan for [facility]. CSW following.   [name], RN 07/02/2017, 4:40 PM

## 2017-07-02 NOTE — Progress Notes (Addendum)
  Progress Note  SUBJECTIVE:    POD #4  Right leg numbness and pain better. Still not walking.   OBJECTIVE:   Vitals:   07/02/17 0434 07/02/17 0700  BP: 126/67   Pulse: 74 82  Resp: 13 18  Temp: 97.6 F (36.4 C)     Intake/Output Summary (Last 24 hours) at 07/02/17 0749 Last data filed at 07/01/17 1845  Gross per 24 hour  Intake                0 ml  Output             1000 ml  Net            -1000 ml   Right infraclavicular incision and bilateral groin incisions c/d/i Palpable ax fem and fem fem graft pulses Sensory function in right foot, but still not moving ankle or toes  ASSESSMENT/PLAN:   71 y.o. male is s/p: right axillofemoral femorofemoral bypass with 8 mm Hemashield graft 4 Days Post-Op   Still without motor function right foot. Had prolonged period of ischemia to right foot. Hopefully will recover some function, but very well may not. No real options for revascularization right foot. Ax-fem and fem-fem grafts patent.  Stable from vascular standpoint for d/c to [name].   Trevor [name] 07/02/2017 7:49 AM -- LABS:   CBC    Component Value Date/Time   WBC 6.9 07/01/2017 1028   HGB 9.5 (L) 07/01/2017 1028   HCT 29.0 (L) 07/01/2017 1028   PLT 124 (L) 07/01/2017 1028    BMET    Component Value Date/Time   NA 138 06/29/2017 0439   K 4.4 06/29/2017 0439   CL 100 (L) 06/29/2017 0439   CO2 27 06/29/2017 0439   GLUCOSE 129 (H) 06/29/2017 0439   BUN 24 (H) 06/29/2017 0439   CREATININE 7.22 (H) 06/29/2017 0439   CALCIUM 8.4 (L) 06/29/2017 0439   GFRNONAA 7 (L) 06/29/2017 0439   GFRAA 8 (L) 06/29/2017 0439    COAG Lab Results  Component Value Date   INR 1.07 06/29/2017   INR 1.06 01/24/2017   INR 0.99 11/01/2016   No results found for: PTT  ANTIBIOTICS:   Anti-infectives    Start     Dose/Rate Route Frequency Ordered Stop   06/29/17 0300  cefUROXime (ZINACEF) 1.5 g in dextrose 5 % 50 mL IVPB     1.5 g 100 mL/hr over 30 Minutes Intravenous  Every 12 hours 06/28/17 2129 06/29/17 0324   06/28/17 1330  Elbasvir-Grazoprevir 50-100 MG TABS 1 tablet  Status:  Discontinued     1 tablet Oral Daily 06/28/17 1339 06/29/17 0844       Virgina Jock, PA-C [facility] Office: [phone] Pager: [phone] 07/02/2017 7:49 AM  I have examined the patient, reviewed and agree with above.Continued improvement regarding right leg weakness. Mild tenderness in his right calf but no evidence of compartment syndrome. Axillary and femoral incisions healing nicely. Easily palpable axillofemoral and femorofemoral graft pulse. 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 Continue to mobilize.  Curt Jews, MD 07/02/2017 9:20 AM

## 2017-07-02 NOTE — Progress Notes (Signed)
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     PT Comments    Pt continues to be limited by pain in his R LE with weightbearing, however he did not experience knee buckling with gait today. Pt currently min guard for bed mobility, transfers and min A for ambulation of 4 feet to the recliner. Pt requires skilled PT to progress ambulation and improve LE strength and endurance to be able to safely mobilize in his discharge environment.    Follow Up Recommendations  [name];Supervision/Assistance - 24 hour     Equipment Recommendations  None recommended by PT       Precautions / Restrictions Precautions Precautions: Fall Restrictions Weight Bearing Restrictions: Yes RUE Weight Bearing: Non weight bearing (A-line)    Mobility  Bed Mobility Overal bed mobility: Needs Assistance Bed Mobility: Supine to Sit     Supine to sit: Min guard     General bed mobility comments: min guard for safety, pt with some difficulty with initial trunk to upright but able to right himself with extra time  Transfers Overall transfer level: Needs assistance Equipment used: 1 person hand held assist Transfers: Sit to/from [employer] Sit to Stand: Min assist         General transfer comment: min A to power up into standing and assist for balance.   Ambulation/Gait Ambulation/Gait assistance: Min assist Ambulation Distance (Feet): 4 Feet Assistive device: Rolling walker (2 wheeled) Gait Pattern/deviations: Step-to  pattern;Antalgic;Trunk flexed;Decreased stance time - right;Decreased step length - left Gait velocity: slowed Gait velocity interpretation: Below normal speed for age/gender General Gait Details: minA for stability, no knee buckling today, vc for stepping towards chair due to decreased vision,        Balance Overall balance assessment: Needs assistance Sitting-balance support: Feet supported;No upper extremity supported Sitting balance-Leahy Scale: Good     Standing balance support: Bilateral upper extremity supported Standing balance-Leahy Scale: Fair Standing balance comment: requires UE support for maintaining static balance                            Cognition Arousal/Alertness: Awake/alert Behavior During Therapy: WFL for tasks assessed/performed Overall Cognitive Status: Within Functional Limits for tasks assessed                                           General Comments General comments (skin integrity, edema, etc.): VSS      Pertinent Vitals/Pain Pain Assessment: 0-10 Pain Score: 8  Pain Location: R leg 1/10 at rest 9/10 with movement Pain Descriptors / Indicators: Aching;Burning;Sharp;Shooting Pain Intervention(s): Limited activity within patient's tolerance;Monitored during session           PT Goals (current goals can now be found in the care plan section) Acute Rehab PT Goals Patient Stated Goal: to get better  PT Goal Formulation: With patient Time For Goal Achievement: 07/06/17 Potential to Achieve Goals: Good  Progress towards PT goals: Progressing toward goals    Frequency    Min 2X/week      PT Plan Current plan remains appropriate       AM-PAC PT "6 Clicks" Daily Activity  Outcome Measure  Difficulty turning over in bed (including adjusting bedclothes, sheets and blankets)?: A Lot Difficulty moving from lying on back to sitting on the side of the bed? : A Lot Difficulty sitting down on and standing up from  a chair with arms (e.g., wheelchair, bedside commode, etc,.)?: A Lot Help needed moving to and from a bed to chair (including a wheelchair)?: A Little Help needed walking in hospital room?: A Lot Help needed climbing 3-5 steps with a railing? : Total 6 Click Score: 12    End of Session Equipment Utilized During Treatment: Gait belt Activity Tolerance: No increased pain Patient left: in chair;with call bell/phone within reach;with chair alarm set Nurse Communication: Mobility status;Other (comment) PT Visit Diagnosis: Unsteadiness on feet (R26.81);Other abnormalities of gait and mobility (R26.89);Muscle weakness (generalized) (M62.81);Difficulty in walking, not elsewhere classified (R26.2);Pain Pain - Right/Left: Right Pain - part of body: Leg     Time: 2567-2091 PT Time Calculation (min) (ACUTE ONLY): 15 min  Charges:  $Therapeutic Activity: 8-22 mins                    G Codes:       Kimorah Ridolfi B. Migdalia Dk PT, DPT Acute Rehabilitation  [phone] Pager [phone]     [place] 07/02/2017, 3:27 PM

## 2017-07-02 NOTE — Discharge Summary (Addendum)
Physician Discharge Summary  Trevor [name] ZDG:[id number] DOB: 10-02-1946 DOA: 06/28/2017  PCP: [name]  Admit date: 06/28/2017 Discharge date: 07/02/2017  Time spent:> 35 minutes  Recommendations for Outpatient Follow-up:  1. Continue supportive therapy in terms of pain control 2. Ensure follow-up with vascular surgeon 3. Ensure follow-up with general surgery for porcelain gallbladder 4. Please see incidental finding listed below on CT scan   Discharge Diagnoses:  Active Problems:   Nonischemic cardiomyopathy (HCC)   History of stroke   Cocaine abuse   A-fib (HCC)   Chronic combined systolic and diastolic CHF (congestive heart failure) (HCC)   Anemia   Secondary hyperparathyroidism ([place])   Non compliance with medical treatment   Coronary artery disease, non-occlusive:    ESRD on hemodialysis ([name])   Anemia of renal disease   Protein-calorie malnutrition, severe (HCC)   Abdominal aortic aneurysm [facility])   Pulmonary edema   Ischemia of extremity   Ischemic leg   Discharge Condition: Stable  Diet recommendation: Renal diet  Filed Weights   07/01/17 1515 07/01/17 1845 07/02/17 0700  Weight: 60.2 kg (132 lb 11.5 oz) 60.3 kg (132 lb 15 oz) 58.6 kg (129 lb 3 oz)    History of present illness:  H/o ESRD on HD, Presented with right leg pain found to have RLE ischemia, s/p urgent AoBF Bypass Graft by VVS. General surgery consulted for porcelain gallbladder (indcidental finding on CT ), outpatient follow up with general surgery.   Hospital Course:  Critical limb ischemia bilaterally right greater than left -he presented to the ED c/o right leg pain, in view of of acute ischemia of the right lower extremity, with absent bilateral femoral pulses, with negative Doppler signals, is to undergo CT Angiowith bilateral lower extremity run off,  -he received heparin drip and was brought to the OR on 7/29, s/p Right axillofemoral femorofemoral bypass with 8 mm Hemashield  graft on 7/29 - currently on asa/plavix, neurontin  Chronic combined systolic and diastolic CHF -last 2-D echo with EF 45 to 95%, grade 1 diastolic dysfunction, systolic function mildly reduced. -Tn 0.04 EKG SR  -cxr on admission with "Cardiac enlargement with mild pulmonary vascular congestion interstitial edema"  -continue lopressor/bidil, No edema on exam, Volume management per nephrology  ESRD on HD MWF  Renal Diet. Follow routine dialysis session with nephrologist  Anemia of chronic diseaseHemoglobin on admission 9.2 at [facility] .  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 - lft wnl, he denies ab pain, + nausea on 7/30 has resolved, no vomiting -general surgery consulted plan is for outpatient follow up with Dr Barry Dienes to discuss findings.  Pancreatic duct dilatation and mild prominence of the central biliary tree through the CBD.  -Incidental findings on ct scan -lft unremarkable, he denies ab pain, + nausea, no vomiting. -defer to outpatient MRCP to exclude ampullary lesion.   FTT; PT eval,  need snf placement  Procedures:  Please see above  Consultations:  Vascular surgeon  Nephrology  General surgery  Discharge Exam: Vitals:   07/02/17 0700 07/02/17 0818  BP:  130/66  Pulse: 82 81  Resp: 18 (!) 23  Temp:      General: Pt in nad, alert and awake Cardiovascular: rrr, no rubs Respiratory: no increased wob, no wheezes  Discharge Instructions   Discharge Instructions    Call MD for:   redness, tenderness, or signs of infection (pain, swelling, redness, odor or green/yellow discharge around incision site)    Complete by:  As directed    Call MD for:  severe uncontrolled pain    Complete by:  As directed    Call MD for:  temperature >100.4    Complete by:  As directed    Diet - low sodium heart healthy    Complete by:  As directed    Discharge instructions    Complete by:  As directed    Please ensure patient follows up with vascular surgeon. Patient was last seen by Dr. Donnetta Hutching.   Increase activity slowly    Complete by:  As directed      Current Discharge Medication List    START taking these medications   Details  multivitamin (RENA-VIT) TABS tablet Take 1 tablet by mouth at bedtime. Qty: 30 each, Refills: 0    oxyCODONE-acetaminophen (PERCOCET/ROXICET) 5-325 MG tablet Take 1-2 tablets by mouth every 6 (six) hours as needed for moderate pain. Qty: 30 tablet, Refills: 0      CONTINUE these medications which have NOT CHANGED   Details  amLODipine (NORVASC) 10 MG tablet Take 10 mg by mouth every evening. Refills: 0    atorvastatin (LIPITOR) 40 MG tablet Take 40 mg by mouth at bedtime.    Omega-3 Fatty Acids (FISH OIL) 1000 MG CAPS Take 1,000 mg by mouth 2 (two) times daily.    aspirin 81 MG chewable tablet Chew 1 tablet (81 mg total) by mouth daily.    calcium acetate (PHOSLO) 667 MG capsule Take 1,334 mg by mouth 3 (three) times daily before meals.     clopidogrel (PLAVIX) 75 MG tablet Take 1 tablet (75 mg total) by mouth daily. Qty: 30 tablet, Refills: 2    docusate sodium (COLACE) 100 MG capsule Take 200 mg by mouth daily.     gabapentin (NEURONTIN) 100 MG capsule Take 1 capsule (100 mg total) by mouth 3 (three) times daily. Qty: 90 capsule, Refills: 2    isosorbide-hydrALAZINE (BIDIL) 20-37.5 MG tablet Take 1 tablet by mouth 3 (three) times daily. Qty: 90 tablet, Refills: 5    metoprolol tartrate (LOPRESSOR) 25 MG tablet Take 1 tablet (25 mg  total) by mouth 2 (two) times daily. Qty: 60 tablet, Refills: 5    Nutritional Supplements (FEEDING SUPPLEMENT, NEPRO CARB STEADY,) LIQD Take 237 mLs by mouth 2 (two) times daily between meals. Refills: 0      STOP taking these medications     acetaminophen (TYLENOL) 325 MG tablet      NIFEdipine (PROCARDIA-XL/ADALAT CC) 60 MG 24 hr tablet      Elbasvir-Grazoprevir (ZEPATIER) 50-100 MG TABS        No Known Allergies  Contact information for follow-up providers    Stark Klein, MD. Call.   Specialty:  General Surgery Why:  Call to make an appointment with Dr. Barry Dienes to discuss gallbladder findings Contact information: [address] [place] [address] [phone]        Rosetta Posner, MD In 2 weeks.   Specialties:  Vascular Surgery, Cardiology Why:  Our office will call you to arrange an appointment  Contact information:  [address] [place] [address] [phone]            Contact information for after-discharge care    Destination    [name] .   Specialty:  [place] information: [address] [place] [phone]                   The results of significant diagnostics from this hospitalization (including imaging, microbiology, ancillary and laboratory) are listed below for reference.    Significant Diagnostic Studies: Ct Angio Ao+bifem W & Or Wo Contrast  Result Date: 06/28/2017 CLINICAL DATA:  ischemia of both lower extremities EXAM: CT ANGIOGRAPHY OF ABDOMINAL AORTA WITH ILIOFEMORAL RUNOFF TECHNIQUE: Multidetector CT imaging of the abdomen, pelvis and lower extremities was performed using the standard protocol during bolus administration of intravenous contrast. Multiplanar CT image reconstructions and MIPs were obtained to evaluate the vascular anatomy. CONTRAST:  Isovue 370 144ml COMPARISON:  None. FINDINGS: VASCULAR Aorta: Extensive partially  calcified atheromatous plaque throughout. Fusiform infrarenal aneurysm 3.2 x2.7 cm maximum transverse dimensions, tapering to a diameter of 2 cm above the bifurcation. There is nonocclusive mural thrombus in the aneurysmal segment. No dissection or stenosis. Celiac: Partially calcified ostial plaque without significant stenosis. Distal branching patent. SMA: Scattered calcified plaque extending from the ostomy overlying the greater than 7 cm without high-grade stenosis. Classic distal branch anatomy. Renals: Single on the left, with calcified nonocclusive ostial plaque. Duplicated on the right, inferior dominant, with calcified ostial plaque resulting in a short segment stenosis just beyond the origin of probable hemodynamic significance, patent distally. IMA: Patent, arising just proximal to the aneurysmal segment of the aorta. RIGHT Lower Extremity Inflow: Fusiform dilatation common iliac artery up to 19 mm diameter with heavy calcified plaque. Origin stenosis of the internal iliac artery. Extensive calcified plaque through the external iliac with tandem areas of at least moderate stenosis in proximal and distal segments. Outflow: Heavy partially calcified eccentric plaque through the common femoral resulting in at least moderate stenosis. Deep femoral branches patent. SFA occlusion just beyond its origin extending through its distal extent. There is continuation of thrombosis through the popliteal artery, which has heavy wall calcification. No definite significant distal reconstituted segments are identified. Runoff: Scattered heavy calcified plaque. No definite reconstitution of tibial any significant named tibial arterial runoff segment although this may be a function of scan timing. LEFT Lower Extremity Inflow: Ectatic proximal common iliac artery up to 14 mm, tapering to a normal caliber distally. Heavily calcified eccentric plaque through the internal and external iliac arteries resulting in tandem areas of  at least mild stenosis. Outflow: Calcified plaque through the common femoral artery resulting in at least mild stenosis. Deep femoral branches patent. Heavily calcified plaque through the length of the superficial and popliteal arteries, with multiple tandem segments of severe stenosis versus segmental occlusion. Runoff: Heavily calcified plaque in the tibioperoneal trunk. Anterior posterior tibial artery is occluded above mid calf. The peroneal artery is seen patent to just above the ankle. No significant reconstituted segments in the foot, although this may be a function of scan timing. Veins: No obvious venous abnormality within the limitations of this arterial phase study. Review of the MIP images confirms the above findings. NON-VASCULAR Lower chest: Small bilateral pleural effusions. Dependent atelectasis posteriorly in both lower lobes. Bilateral emphysematous changes. Hepatobiliary: Coarse calcifications throughout the distended gallbladder. There is central intrahepatic biliary ductal prominence well as prominence of the CBD, which  can be seen down to the ampulla. No focal liver lesion. Pancreas: Mild ductal dilatation up to 5 mm, seen down to the level of the ampulla. No discrete mass. No adjacent inflammatory changes. Spleen: Normal in size without focal abnormality. Adrenals/Urinary Tract: Normal adrenals. Scattered parenchymal calcifications in both kidneys may be vascular. Low-attenuation lesions in the right kidney possibly cysts but incompletely evaluated. No hydronephrosis. Urinary bladder is incompletely distended. Stomach/Bowel: Stomach is partially distended by ingested material. Small bowel is nondilated. Normal appendix. The colon is nondilated, unremarkable. Lymphatic: No abdominal or pelvic adenopathy identified. Reproductive: Marked prostatic enlargement protruding into the lumen of the urinary bladder. Other: No ascites.  No free air. Musculoskeletal: Schmorl's node in the inferior endplate  of L3. No fracture or worrisome bone lesion. IMPRESSION: VASCULAR 1. 3.2 cm infrarenal abdominal aortic aneurysm with ectatic common iliac arteries, 1.9 cm right and 1.4 cm left. Recommend followup by ultrasound in 3 years. This recommendation follows ACR consensus guidelines: White Paper of the ACR Incidental Findings Committee II on Vascular Findings. J Am Coll Radiol 2013; 32:671-245 2. Tandem areas of stenosis in the RIGHT external iliac artery, of probable hemodynamic significance. 3. Long segment occlusion of the RIGHT superficial femoral and popliteal arteries, with no identified distal collateral reconstitution of tibial arterial segments. 4. Tandem areas of stenosis in the LEFT external iliac artery, of possible hemodynamic significance. 5. Heavy atheromatous plaque through the LEFT superficial femoral and popliteal arteries with tandem areas of severe stenosis or segmental occlusion, with patent peroneal runoff. NON-VASCULAR 1. 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 Surgical consultation suggested. 2. Pancreatic duct dilatation and mild prominence of the central biliary tree through the CBD. If there is clinical or laboratory evidence of obstruction, consider ERCP or MRCP to exclude ampullary lesion. 3. Bilateral pleural effusions 4. Marked prostatic enlargement Electronically Signed   By: Lucrezia Europe M.D.   On: 06/28/2017 14:07   Dg Chest Port 1 View  Result Date: 06/29/2017 CLINICAL DATA:  Central line placement EXAM: PORTABLE CHEST 1 VIEW COMPARISON:  06/27/2017 FINDINGS: Interval placement of left central venous catheter with tip over the mid SVC region likely at the junction of the SVC and brachiocephalic vein. No pneumothorax. Mild cardiac enlargement with mild pulmonary vascular congestion. Interstitial pattern to the lungs suggesting edema. Similar appearance to previous study. Small amount of fluid in the right minor fissure but no pleural effusions identified. No  pneumothorax. Calcified and tortuous aorta. Degenerative changes in the spine. IMPRESSION: Left central venous catheter tip projected over the mid SVC at the junction of the SVC and brachiocephalic vein. Cardiac enlargement with mild pulmonary vascular congestion interstitial edema. Electronically Signed   By: Lucienne Capers M.D.   On: 06/29/2017 03:39   Dg Chest Portable 1 View  Result Date: 06/27/2017 CLINICAL DATA:  Sudden onset dyspnea tonight. EXAM: PORTABLE CHEST 1 VIEW COMPARISON:  04/04/2017 FINDINGS: Diffuse interstitial thickening, new and likely due to interstitial edema. No focal airspace consolidation. No large effusions. Normal heart size. Unremarkable hilar and mediastinal contours. IMPRESSION: Extensive interstitial thickening, probably interstitial edema. Electronically Signed   By: Andreas Newport M.D.   On: 06/27/2017 06:22   US Abdomen Limited Ruq  Result Date: 07/01/2017 CLINICAL DATA:  Abdominal pain. EXAM: ULTRASOUND ABDOMEN LIMITED RIGHT UPPER QUADRANT COMPARISON:  CT 06/28/2017. FINDINGS: Exam made available for my evaluation on today's date. Gallbladder: Sludge noted within the gallbladder. Soft tissue mass in the gallbladder cannot be completely excluded. Echogenic gallbladder wall noted consistent  with calcification within a porcelain gallbladder. Common bile duct: Diameter: 5.5 mm. Liver: Liver has a coarse echotexture with slightly nodular contour. Cirrhosis cannot be excluded. No focal hepatic abnormality identified. Portal vein is patent. IMPRESSION: 1. Porcelain gallbladder again noted. Sludge noted within the gallbladder. Soft tissue mass in the gallbladder cannot be excluded . No definite biliary distention noted on today's exam. 2. Liver has a coarse echotexture with slightly nodular contour. Cirrhosis cannot be excluded . Electronically Signed   By: Marcello Moores  Register   On: 07/01/2017 09:59    Microbiology: No results found for this or any previous visit (from the  past 240 hour(s)).   Labs: Basic Metabolic Panel:  Recent Labs Lab 06/27/17 0550 06/27/17 1302 06/28/17 1125 06/28/17 1133 06/28/17 1627 06/29/17 0439  NA 141 136 139 140 138 138  K 3.4* 3.2* 3.5 3.4* 3.8 4.4  CL 100* 97* 98* 98*  --  100*  CO2 28 30 24   --   --  27  GLUCOSE 132* 88 140* 138* 105* 129*  BUN 15 <5* 14 14  --  24*  CREATININE 6.14* 2.98* 6.12* 6.40*  --  7.22*  CALCIUM 8.4* 8.8* 9.5  --   --  8.4*   Liver Function Tests:  Recent Labs Lab 06/27/17 0550 06/27/17 1302 06/28/17 1125 06/29/17 0439  AST 17 13* 16 37  ALT 7* 6* 8* 13*  ALKPHOS 72 66 73 61  BILITOT 0.8 0.9 0.9 0.8  PROT 6.4* 6.7 6.9 5.9*  ALBUMIN 3.1* 3.2* 3.5 3.0*   No results for input(s): LIPASE, AMYLASE in the last 168 hours. No results for input(s): AMMONIA in the last 168 hours. CBC:  Recent Labs Lab 06/27/17 0550 06/27/17 1302 06/28/17 1125 06/28/17 1133 06/28/17 1627 07/01/17 1028  WBC 5.7 3.7* 6.6  --   --  6.9  NEUTROABS 3.9 2.3 3.9  --   --  4.9  HGB 9.1* 8.8* 9.3* 9.2* 6.8* 9.5*  HCT 27.7* 26.5* 27.5* 27.0* 20.0* 29.0*  MCV 88.2 87.7 87.0  --   --  86.6  PLT 152 127* 151  --   --  124*   Cardiac Enzymes:  Recent Labs Lab 06/28/17 1125  CKTOTAL 79   BNP: BNP (last 3 results)  Recent Labs  01/25/17 0059 04/04/17 1720 06/27/17 0550  BNP 1,608.5* 1,151.0* >4,500.0*    ProBNP (last 3 results) No results for input(s): PROBNP in the last 8760 hours.  CBG: No results for input(s): GLUCAP in the last 168 hours.   Signed:  Velvet Bathe MD.  [facility] 07/02/2017, 4:46 PM

## 2017-07-03 ENCOUNTER — Telehealth: Payer: Self-pay | Admitting: Vascular Surgery

## 2017-07-03 NOTE — Telephone Encounter (Signed)
Sched appt 07/21/17 at 1:45. Spoke to pt and nurse at [facility].

## 2017-07-03 NOTE — Telephone Encounter (Signed)
-----   Message from Mena Goes, RN sent at 07/02/2017  8:57 AM EDT ----- Regarding: 2 weeks   ----- Message ----- From: Alvia Grove, PA-C Sent: 07/02/2017   7:57 AM To: V[facility] Charge Pool  S/p right axillofemoral femorofemoral bypass with 8 mm Hemashield graft 06/28/17  F/u with Dr. Donnetta Hutching in 2 weeks.   Thanks Maudie Mercury

## 2017-07-07 ENCOUNTER — Encounter: Payer: Self-pay | Admitting: Internal Medicine

## 2017-07-07 ENCOUNTER — Non-Acute Institutional Stay (SKILLED_NURSING_FACILITY): Payer: Medicare Other | Admitting: Internal Medicine

## 2017-07-07 DIAGNOSIS — K828 Other specified diseases of gallbladder: Secondary | ICD-10-CM | POA: Diagnosis not present

## 2017-07-07 DIAGNOSIS — Z992 Dependence on renal dialysis: Secondary | ICD-10-CM

## 2017-07-07 DIAGNOSIS — D638 Anemia in other chronic diseases classified elsewhere: Secondary | ICD-10-CM | POA: Diagnosis not present

## 2017-07-07 DIAGNOSIS — Z9189 Other specified personal risk factors, not elsewhere classified: Secondary | ICD-10-CM

## 2017-07-07 DIAGNOSIS — I998 Other disorder of circulatory system: Secondary | ICD-10-CM

## 2017-07-07 DIAGNOSIS — N400 Enlarged prostate without lower urinary tract symptoms: Secondary | ICD-10-CM | POA: Insufficient documentation

## 2017-07-07 DIAGNOSIS — I10 Essential (primary) hypertension: Secondary | ICD-10-CM | POA: Diagnosis not present

## 2017-07-07 DIAGNOSIS — N186 End stage renal disease: Secondary | ICD-10-CM | POA: Diagnosis not present

## 2017-07-07 NOTE — Assessment & Plan Note (Signed)
Outpatient follow-up with Dr. Barry Dienes will be scheduled

## 2017-07-07 NOTE — Patient Instructions (Signed)
See assessment and plan under each diagnosis in the problem list and acutely for this visit 

## 2017-07-07 NOTE — Assessment & Plan Note (Signed)
Subjectively the postoperative pain is improved. Opioids will be weaned and discontinued due to risk of adverse reaction or even death.

## 2017-07-07 NOTE — Progress Notes (Signed)
NURSING HOME LOCATION:  [facility] ROOM NUMBER:  [id number]  CODE STATUS:  Full Code  PCP:  [name]  [address] [place] [address]  This is a comprehensive admission note to [facility] performed on this date less than 30 days from date of admission. Included are preadmission medical/surgical history;reconciled medication list; family history; social history and comprehensive review of systems.  Corrections and additions to the records were documented . Comprehensive physical exam was also performed. Additionally a clinical summary was entered for each active diagnosis pertinent to this admission in the Problem List to enhance continuity of care.  HPI: The patient was hospitalized 7/29-07/02/17 for ischemic vascular disease of the right lower extremity with absent bifemoral pulses and negative Doppler signals associated with pain. Urgent axillary-femoral bypass grafting was performed by [facility] 7/29. An incidental finding on CT  was a porcelain gallbladder and dilation of the pancreatic duct and mild prominence of the central biliary tree.Outpatient MRCP to assess these findings was proposed once the patient is stable. Dr Gennie Alma surgery is to reassess the porcelain GB as an outpatient as this be a risk factor for gallbladder cancer. He was asymptomatic in reference to abdominal pain.   The patient has chronic combined systolic and diastolic congestive heart failure and grade 1 diastolic dysfunction. Admission x-ray revealed cardiac enlargement with mild pulmonary vascular congestion and interstitial edema.   The patient has end-stage renal disease and is on hemodialysis Monday, Wednesday, and Friday. His anemia of chronic disease was stable on serial CBCs. On 7/30 creatinine 7.22, BUN 24, and GFR 8.   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 He is on aspirin,  Plavix, statins and antihypertensives as secondary prevention. The patient has received treatment for hepatitis C.  Social history: He quit smoking less than a month ago, he was smoking a half a pack a day. He quit drinking in 2014 after his stroke.  Family history: Reviewed  Review of systems: Initially he said that the postoperative pain in the inguinal area is gone but then he stated it remained as soreness but was at least 50% better. He describes ongoing weakness in the right leg. Physical therapy has been of benefit. He has a hacking, nonproductive cough.He is not on an ACE-I due to ESRD He describes "whole body arthritis". He states he has oliguria and rarely urgency since he has been on dialysis.  Constitutional: No fever,significant weight change, fatigue  Eyes: No redness, discharge, pain, vision change ENT/mouth: No nasal congestion,  purulent discharge, earache,change in hearing ,sore throat  Cardiovascular: No chest pain, palpitations,paroxysmal nocturnal dyspnea, claudication, edema  Respiratory: No sputum production,hemoptysis, DOE , significant snoring,apnea   Gastrointestinal: No heartburn,dysphagia,abdominal pain (only @ op site), nausea / vomiting,rectal bleeding, melena,change in bowels Genitourinary: No dysuria,hematuria, pyuria,  incontinence, nocturia Dermatologic: No rash, pruritus, change in appearance of skin Neurologic: No dizziness,headache,syncope, seizures, numbness , tingling Psychiatric: No significant anxiety , depression, insomnia, anorexia Endocrine: No change in hair/skin/ nails, excessive thirst, excessive hunger, excessive urination  Hematologic/lymphatic: No significant bruising, lymphadenopathy,abnormal bleeding Allergy/immunology: No itchy/ watery eyes, significant sneezing, urticaria, angioedema  Physical exam:  Pertinent or positive findings:Temporal wasting is present. He has complete dentures. Arcus senilis is noted. Sclera are muddy. Heart  sounds are distant. The heart sounds are heard best in the epigastrium. There is some splitting of the second heart sound intermittently. Breath sounds  are decreased. Fistula is present in the left upper extremity. The limbs are atrophic. He has thick, discolored right thumbnail   General appearance:Adequately nourished; no acute distress , increased work of breathing is present.   Lymphatic: No lymphadenopathy about the head, neck, axilla . Eyes: No conjunctival inflammation or lid edema is present. There is no scleral icterus. Ears:  External ear exam shows no significant lesions or deformities.   Nose:  External nasal examination shows no deformity or inflammation. Nasal mucosa are pink and moist without lesions ,exudates Oral exam: lips and gums are healthy appearing.There is no oropharyngeal erythema or exudate . Neck:  No thyromegaly, masses, tenderness noted.    Heart:  No murmur, click, rub .  Lungs: without wheezes, rhonchi,rales , rubs. Abdomen:Bowel sounds are normal. Abdomen is soft and nontender with no organomegaly, hernias,masses. GU: deferred  Extremities:  No cyanosis, clubbing,edema  Neurologic exam : Balance,Rhomberg,finger to nose testing could not be completed due to clinical state; but PT/OT involved in his care. Skin: Warm & dry w/o tenting. No significant lesions or rash.  See clinical summary under each active problem in the Problem List with associated updated therapeutic plan

## 2017-07-07 NOTE — Assessment & Plan Note (Signed)
As per [facility]

## 2017-07-07 NOTE — Assessment & Plan Note (Signed)
Anemia significantly improved ,most recent hematocrit 9.5 up from a value of 6.8 Monitor for any evidence of bleeding on the low-dose aspirin and Plavix

## 2017-07-07 NOTE — Assessment & Plan Note (Signed)
[facility] will not monitor renal function as assessed at HD Monday, Wednesday, Friday by nephrology

## 2017-07-07 NOTE — Assessment & Plan Note (Addendum)
BP controlled; no change in antihypertensive medications [facility] had requested a less expensive generic in place of Bildil; but doses were not equivalent

## 2017-07-08 ENCOUNTER — Encounter: Payer: Self-pay | Admitting: Internal Medicine

## 2017-07-13 ENCOUNTER — Encounter: Payer: Self-pay | Admitting: Vascular Surgery

## 2017-07-21 ENCOUNTER — Encounter: Payer: Self-pay | Admitting: Vascular Surgery

## 2017-07-21 ENCOUNTER — Ambulatory Visit (INDEPENDENT_AMBULATORY_CARE_PROVIDER_SITE_OTHER): Payer: Self-pay | Admitting: Vascular Surgery

## 2017-07-21 VITALS — BP 120/57 | HR 61 | Temp 98.6°F | Ht 70.0 in | Wt 133.0 lb

## 2017-07-21 DIAGNOSIS — I739 Peripheral vascular disease, unspecified: Secondary | ICD-10-CM

## 2017-07-21 NOTE — Progress Notes (Signed)
POST OPERATIVE OFFICE NOTE    CC:  F/u for surgery  HPI:  This is a 71 y.o. male who presents to the ER today with c/o right leg pain and numbness.  He states it started about an hour or two before he arrived.  He does not have any pain in the left leg.   He was seen by Dr. Donnetta Hutching in 2016 and had ABI's, which revealed 0.68 on the right and 0.76 on the left.  His waveforms were monophasic.  At that time, he also c/o right leg numbness and unsteady gait with ankle difficulty.  Pt had ultrasound of abdominal aorta in April 2016 by cardiology at which time the distal aorta measured 2.6 x 2.7cm and the right CIA measured 1.8 x 1.7cm, which was no change from previous exam.  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  He is on a beta blocker, CCB for blood pressure control.  He is on a statin for cholesterol management.    He takes aspirin/Plavix daily.  On 06/28/2017 he underwent Right axillofemoral femorofemoral bypass with 8 mm Hemashield graft by Dr. Donnetta Hutching secondary to Critical limb ischemia bilaterally right greater than left.   He continues to have diminished sensation and no motor in his toes or ankle on the right due to prolonged ischemia.  He is still in rehab and making progress.  He reports he no longer has right calf pain.      No Known Allergies  Current Outpatient Prescriptions  Medication Sig Dispense Refill  . amLODipine (NORVASC) 10 MG tablet Take 10 mg by mouth every evening.  0  . aspirin 81 MG chewable tablet Chew 1 tablet (81 mg total) by mouth daily.    Marland Kitchen atorvastatin (LIPITOR) 40 MG tablet Take 40 mg by mouth at bedtime.    . calcium acetate (PHOSLO) 667 MG capsule Take 1,334 mg by mouth 3 (three) times daily before meals.     . clopidogrel (PLAVIX) 75 MG tablet Take 1 tablet (75 mg total) by mouth daily. 30 tablet 2  . docusate sodium (COLACE) 100 MG capsule Take 200 mg by mouth daily.     Marland Kitchen gabapentin (NEURONTIN) 100 MG capsule Take 1 capsule  (100 mg total) by mouth 3 (three) times daily. 90 capsule 2  . isosorbide-hydrALAZINE (BIDIL) 20-37.5 MG tablet Take 1 tablet by mouth 3 (three) times daily. 90 tablet 5  . metoprolol tartrate (LOPRESSOR) 25 MG tablet Take 1 tablet (25 mg total) by mouth 2 (two) times daily. 60 tablet 5  . multivitamin (RENA-VIT) TABS tablet Take 1 tablet by mouth at bedtime. 30 each 0  . Nutritional Supplements (FEEDING SUPPLEMENT, NEPRO CARB STEADY,) LIQD Take 237 mLs by mouth 2 (two) times daily between meals.  0  . Omega-3 Fatty Acids (FISH OIL) 1000 MG CAPS Take 1,000 mg by mouth 2 (two) times daily.    Marland Kitchen oxyCODONE-acetaminophen (PERCOCET/ROXICET) 5-325 MG tablet Take 1 tablet by mouth every 6 (six) hours as needed for severe pain.     No current facility-administered medications for this visit.      ROS:  See HPI  Physical Exam:  Vitals:   07/21/17 1436  BP: (!) 120/57  Pulse: 61  Temp: 98.6 F (37 C)  SpO2: 100%    Incision:  Well healed groins with out hematomas, subclavian incision on the right is well healed. Extremities:  Feet are warm, decreased sensation on the right with edema below the knee.  Abdomen:  Easily palpable axillofemoral and femorofemoral graft pulse.   Assessment/Plan:  This is a 71 y.o. male who is s/p:axillofemoral and femorofemoral graft.  Easily palpable axillofemoral and femorofemoral graft pulse. 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 Continue to mobilize.  We will schedule him to return in 3 months for ABI's and evaluation on his progress.  Disposition is stable.      Theda Sers Rhegan Trunnell [facility] PA-C [facility] [phone]  The patient was seen in conjunction with Dr. Donnetta Hutching today  I have examined the patient, reviewed and agree with above.Surgical incisions healing nicely and easily palpable bypasses. 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  Curt Jews, MD 07/21/2017 3:40 PM

## 2017-07-23 NOTE — Addendum Note (Signed)
Addended by: Lianne Cure A on: 07/23/2017 08:49 AM   Modules accepted: Orders

## 2017-07-30 ENCOUNTER — Non-Acute Institutional Stay (SKILLED_NURSING_FACILITY): Payer: Medicare Other | Admitting: Internal Medicine

## 2017-07-30 ENCOUNTER — Encounter: Payer: Self-pay | Admitting: Internal Medicine

## 2017-07-30 DIAGNOSIS — M79675 Pain in left toe(s): Secondary | ICD-10-CM

## 2017-07-30 DIAGNOSIS — B351 Tinea unguium: Secondary | ICD-10-CM | POA: Diagnosis not present

## 2017-07-30 DIAGNOSIS — R7309 Other abnormal glucose: Secondary | ICD-10-CM

## 2017-07-30 DIAGNOSIS — M79674 Pain in right toe(s): Secondary | ICD-10-CM | POA: Diagnosis not present

## 2017-07-30 NOTE — Progress Notes (Signed)
    NURSING HOME LOCATION:  [facility] ROOM NUMBER:  309-A  CODE STATUS:  Full Code  PCP:  [name]  [address] [place] [address]   This is a nursing facility follow up for specific acute issue of " pain in toes" as per COC submitted by his nurse..  Interim medical record and care since last [place] visit was updated with review of diagnostic studies and change in clinical status since last visit were documented.  HPI: He states he began to have pain in his toes last night w/o any predisposition. Pain was actually localized to the toenails and not the toe. In fact he states he's had some numbness at the base of 1 toe but no pain. He denies active pain at this time. He does have a history of extremity ischemia. He has no diagnosis of diabetes, glucoses have ranged from 105-129. A1cs on record include a value of 4.6% on 09/07/16 and less than 4.2% on 01/25/17.   Review of systems:  Constitutional: No fever Musculoskeletal: No joint stiffness, joint swelling,pain Dermatologic: No rash, pruritus, change in appearance of skin Endocrine: No change in hair/skin, excessive thirst, excessive hunger, excessive urination   Physical exam:  Pertinent or positive findings: The most striking finding is decreased in the right nasolabial fold. He is blind in the right eye. Ptosis present on the left. The skin over the right foot is somewhat shiny and he has slight contractures of the toes. Pedal pulses are decreased. The skin on the left foot is somewhat dry & cracked. The toenails are thickened and discolored.  General appearance:Thin but adequately nourished; no acute distress , increased work of breathing is present.   Eyes: No conjunctival inflammation or lid edema is present. There is no scleral icterus. Extremities:  No cyanosis, clubbing,edema  Skin: Warm & dry w/o tenting. No significant lesions or rash.  See summary under acute  problem  with associated updated therapeutic plan

## 2017-07-30 NOTE — Patient Instructions (Signed)
See assessment and plan under each diagnosis acutely for this visit  

## 2017-07-31 ENCOUNTER — Encounter: Payer: Self-pay | Admitting: Internal Medicine

## 2017-07-31 LAB — HEMOGLOBIN A1C: HEMOGLOBIN A1C: 4.4

## 2017-08-07 ENCOUNTER — Non-Acute Institutional Stay (SKILLED_NURSING_FACILITY): Payer: Medicare Other | Admitting: Adult Health

## 2017-08-07 ENCOUNTER — Encounter: Payer: Self-pay | Admitting: Adult Health

## 2017-08-07 DIAGNOSIS — I10 Essential (primary) hypertension: Secondary | ICD-10-CM

## 2017-08-07 DIAGNOSIS — K828 Other specified diseases of gallbladder: Secondary | ICD-10-CM

## 2017-08-07 DIAGNOSIS — I5042 Chronic combined systolic (congestive) and diastolic (congestive) heart failure: Secondary | ICD-10-CM | POA: Diagnosis not present

## 2017-08-07 DIAGNOSIS — Z8673 Personal history of transient ischemic attack (TIA), and cerebral infarction without residual deficits: Secondary | ICD-10-CM

## 2017-08-07 DIAGNOSIS — N186 End stage renal disease: Secondary | ICD-10-CM | POA: Diagnosis not present

## 2017-08-07 DIAGNOSIS — D638 Anemia in other chronic diseases classified elsewhere: Secondary | ICD-10-CM

## 2017-08-07 DIAGNOSIS — Z992 Dependence on renal dialysis: Secondary | ICD-10-CM | POA: Diagnosis not present

## 2017-08-07 DIAGNOSIS — I998 Other disorder of circulatory system: Secondary | ICD-10-CM

## 2017-08-07 DIAGNOSIS — G629 Polyneuropathy, unspecified: Secondary | ICD-10-CM | POA: Diagnosis not present

## 2017-08-07 DIAGNOSIS — R5381 Other malaise: Secondary | ICD-10-CM

## 2017-08-07 NOTE — Progress Notes (Signed)
DATE:  08/07/2017   MRN:  [id number]  BIRTHDAY: [birth year]  Facility:  Nursing Home Location:  [facility] [place] Room Number: 128-A  LEVEL OF CARE:  [name] (31)  Contact Information    Name Relation Home Work Mobile   [place] Daughter  [phone]   [place], Esper [phone]     [place], [place] Sister   [phone]   Hines,Nancy Sister [phone]  [phone]       Code Status History    Date Active Date Inactive Code Status Order ID Comments User Context   06/28/2017  1:39 PM 07/02/2017  8:47 PM Full Code [id number]  Rondel Jumbo, PA-C ED   01/25/2017 12:21 AM 01/26/2017 10:49 PM Full Code [id number]  Everrett Coombe, MD Inpatient   12/08/2016  5:03 AM 12/12/2016 12:17 AM Full Code [id number]  Norval Morton, MD ED   11/01/2016  1:44 AM 11/04/2016 11:26 AM Full Code [id number]  Lorretta Harp, MD Inpatient   09/07/2016 11:36 AM 09/08/2016  7:59 PM Full Code [id number]  Maren Reamer, MD ED   03/09/2016 12:23 AM 03/12/2016 11:38 PM Full Code [id number]  Theressa Millard, MD Inpatient   12/26/2015  8:45 PM 01/03/2016  7:31 PM Full Code [id number]  Domenic Polite, MD Inpatient   08/12/2015 10:14 PM 08/18/2015  5:29 PM Full Code [id number]  Theressa Millard, MD Inpatient   04/04/2015 12:18 AM 04/05/2015  9:57 PM Full Code [id number]  Ivor Costa, MD Inpatient   09/08/2014  1:15 AM 09/09/2014  6:50 PM Full Code [id number]  Ivor Costa, MD Inpatient   07/08/2014 12:58 PM 07/11/2014  7:23 PM Full Code [id number]  [name], MD Inpatient   07/07/2014 12:31 PM 07/08/2014 12:58 PM Full Code [id number]  Leonie Man, MD Inpatient   07/05/2014  9:46 PM 07/07/2014 12:31 PM Full Code [id number]  Etta Quill, DO ED   05/19/2014 11:18 PM 05/20/2014  4:26 PM Full Code [id number]  Theressa Millard, MD Inpatient   09/17/2012  5:32 PM 10/02/2012  1:01 AM Full Code [id number]  Velvet Bathe, MD ED   05/05/2012 10:43 PM 05/08/2012  3:21 PM Full Code [id number]  Matilde Bash, RN  Inpatient       Chief Complaint  Patient presents with  . Medical Management of Chronic Issues    Routine visit    HISTORY OF PRESENT ILLNESS:  This is a 69-YO male seen for a routine visit.  He has a PMH of stroke, hepatitis C, HLD, HTN, and is partially blind. He was seen in the room today.   He has been admitted to [facility] on 07/02/17  With bilateral limb ischemia S/P Right axillofemoral femoro-femoral bypass on 7/29. He is currently having a short-term rehabilitation.   PAST MEDICAL HISTORY:  Past Medical History:  Diagnosis Date  . AAA (abdominal aortic aneurysm) ([place])   . Anxiety   . Arthritis   . Blindness   . CHF (congestive heart failure) ([place])   . 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 ESRD on hemodialysis ([place])    started HD 2014-15  . Headache(784.0)   . HTN (hypertension)   . Protein-calorie malnutrition ([place])      CURRENT MEDICATIONS: Reviewed  Patient's Medications  New Prescriptions   No medications on file  Previous Medications   AMINO ACIDS-PROTEIN HYDROLYS (FEEDING SUPPLEMENT, PRO-STAT SUGAR FREE 64,) LIQD  Take 30 mLs by mouth 2 (two) times daily.   AMLODIPINE (NORVASC) 10 MG TABLET    Take 10 mg by mouth every evening.   ASPIRIN 81 MG CHEWABLE TABLET    Chew 1 tablet (81 mg total) by mouth daily.   ATORVASTATIN (LIPITOR) 40 MG TABLET    Take 40 mg by mouth at bedtime.   CLOPIDOGREL (PLAVIX) 75 MG TABLET    Take 1 tablet (75 mg total) by mouth daily.   DOCUSATE SODIUM (COLACE) 100 MG CAPSULE    Take 200 mg by mouth daily.    GABAPENTIN (NEURONTIN) 100 MG CAPSULE    Take 1 capsule (100 mg total) by mouth 3 (three) times daily.   ISOSORBIDE-HYDRALAZINE (BIDIL) 20-37.5 MG TABLET    Take 1 tablet by mouth 3 (three) times daily.   METOPROLOL TARTRATE (LOPRESSOR) 25 MG TABLET    Take 1 tablet (25 mg total) by mouth 2 (two) times daily.   MULTIVITAMIN (RENA-VIT) TABS TABLET    Take  1 tablet by mouth at bedtime.   NUTRITIONAL SUPPLEMENTS (FEEDING SUPPLEMENT, NEPRO CARB STEADY,) LIQD    Take 237 mLs by mouth 2 (two) times daily between meals.   OMEGA-3 FATTY ACIDS (FISH OIL) 1000 MG CAPS    Take 1,000 mg by mouth 2 (two) times daily.  Modified Medications   No medications on file  Discontinued Medications   No medications on file     No Known Allergies   REVIEW OF SYSTEMS:  GENERAL: no change in appetite, no fatigue, no weight changes, no fever, chills or weakness MOUTH and THROAT: Denies oral discomfort, gingival pain or bleeding, pain from teeth or hoarseness   RESPIRATORY: no cough, SOB, DOE, wheezing, hemoptysis CARDIAC: no chest pain, edema or palpitations GI: no abdominal pain, diarrhea, constipation, heart burn, nausea or vomiting GU: Denies dysuria, frequency, hematuria, incontinence, or discharge PSYCHIATRIC: Denies feeling of depression or anxiety. No report of hallucinations, insomnia, paranoia, or agitation   PHYSICAL EXAMINATION  GENERAL APPEARANCE:  In no acute distress.  SKIN:  Skin is warm and dry.  EYES: Right eye is blind, left eye has no peripheral MOUTH and THROAT: Lips are without lesions. Oral mucosa is moist and without lesions. RESPIRATORY: breathing is even & unlabored, BS CTAB CARDIAC: RRR, no murmur,no extra heart sounds, no edema GI: abdomen soft, normal BS, no masses, no tenderness, no hepatomegaly, no splenomegaly EXTREMITIES:  Able to move X 4 extremities PSYCHIATRIC: Alert and oriented X 3. Affect and behavior are appropriate    LABS/RADIOLOGY: Labs reviewed: Basic Metabolic Panel:  Recent Labs  11/03/16 1820  12/08/16 1504 12/10/16 1834  06/27/17 1302 06/28/17 1125 06/28/17 1133 06/28/17 1627 06/29/17 0439  NA 136  < > 142 134*  < > 136 139 140 138 138  K 5.4*  < > 4.5 2.9*  < > 3.2* 3.5 3.4* 3.8 4.4  CL 98*  < > 104 98*  < > 97* 98* 98*  --  100*  CO2 25  < > 28 27  < > 30 24  --   --  27  GLUCOSE 122*  < >  103* 89  < > 88 140* 138* 105* 129*  BUN 43*  < > 37* 9  < > <5* 14 14  --  24*  CREATININE 11.22*  < > 10.46* 3.00*  < > 2.98* 6.12* 6.40*  --  7.22*  CALCIUM 9.5  < > 9.1 8.8*  < > 8.8* 9.5  --   --  8.4*  PHOS 4.7*  --  4.2 1.5*  --   --   --   --   --   --   < > = values in this interval not displayed. Liver Function Tests:  Recent Labs  06/27/17 1302 06/28/17 1125 06/29/17 0439  AST 13* 16 37  ALT 6* 8* 13*  ALKPHOS 66 73 61  BILITOT 0.9 0.9 0.8  PROT 6.7 6.9 5.9*  ALBUMIN 3.2* 3.5 3.0*   CBC:  Recent Labs  06/27/17 1302 06/28/17 1125 06/28/17 1133 06/28/17 1627 07/01/17 1028  WBC 3.7* 6.6  --   --  6.9  NEUTROABS 2.3 3.9  --   --  4.9  HGB 8.8* 9.3* 9.2* 6.8* 9.5*  HCT 26.5* 27.5* 27.0* 20.0* 29.0*  MCV 87.7 87.0  --   --  86.6  PLT 127* 151  --   --  124*   Lipid Panel:  Recent Labs  11/01/16 0012 11/01/16 0506 01/24/17 1935  HDL 38* 33* 37*   Cardiac Enzymes:  Recent Labs  01/25/17 0059 01/25/17 0714 01/25/17 1148 06/28/17 1125  CKTOTAL  --   --   --  79  TROPONINI 0.24* 0.47* 0.42*  --    CBG:  Recent Labs  11/02/16 0626  GLUCAP 91     ASSESSMENT/PLAN:  1. Physical deconditioning - continue rehabilitation with PT and OT, for therapeutic strengthening exercises, fall precautions   2. Ischemia of extremity - S/P AoBF Bypass Graft, follow-up with [facility],   3. Chronic combined systolic and diastolic CHF (congestive heart failure) (HCC) - no SOB; continue BiDil 20-37.5 mg 1 tab 3 times daily and  metoprolol or tartrate 25 mg 1 tab twice a day   4. ESRD on hemodialysis [facility]) - continue hemodialysis every TThSat   5. Essential hypertension - well controlled; continue BiDil 20-37.5 mg 1 tab 3 times daily, Norvasc 10 mg 1 tab daily and metoprolol or tartrate 25 mg 1 tab twice a day   6. 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 Barry Dienes   8. History of CVA (cerebrovascular aBiDil 20-37.5 mg 1 tab 3 times daily, Norvasc 10 mg 1 tab daily and metoprolol or tartrate 25 mg 1 tab twice a day 1 year year ccident) - stable; continue Plavix 75 mg 1 tab daily, Lipitor 40 mg 1 tab daily and aspirin 81 mg daily,    9. Neuropathy - stable; continue Neurontin 100 mg 1 capsule 3 times daily      Goals of care:  Short-term rehabilitation    Ahad Colarusso C. O'Brien - NP    [employer] [phone]

## 2017-08-08 LAB — CBC AND DIFFERENTIAL
HEMATOCRIT: 33 — AB (ref 41–53)
HEMOGLOBIN: 10.8 — AB (ref 13.5–17.5)
NEUTROS ABS: 3
Platelets: 141 — AB (ref 150–399)
WBC: 6.4

## 2017-08-20 ENCOUNTER — Ambulatory Visit: Payer: Medicare Other | Admitting: Podiatry

## 2017-08-31 ENCOUNTER — Encounter (HOSPITAL_COMMUNITY): Payer: Self-pay | Admitting: Emergency Medicine

## 2017-08-31 ENCOUNTER — Emergency Department (HOSPITAL_COMMUNITY)
Admission: EM | Admit: 2017-08-31 | Discharge: 2017-08-31 | Disposition: A | Discharge status: Home / Self Care | Payer: Medicare Other | Attending: Emergency Medicine | Admitting: Emergency Medicine

## 2017-08-31 DIAGNOSIS — Z452 Encounter for adjustment and management of vascular access device: Secondary | ICD-10-CM | POA: Diagnosis not present

## 2017-08-31 DIAGNOSIS — R58 Hemorrhage, not elsewhere classified: Secondary | ICD-10-CM

## 2017-08-31 DIAGNOSIS — I2510 Atherosclerotic heart disease of native coronary artery without angina pectoris: Secondary | ICD-10-CM | POA: Diagnosis not present

## 2017-08-31 DIAGNOSIS — Z7902 Long term (current) use of antithrombotics/antiplatelets: Secondary | ICD-10-CM | POA: Insufficient documentation

## 2017-08-31 DIAGNOSIS — Z87891 Personal history of nicotine dependence: Secondary | ICD-10-CM | POA: Insufficient documentation

## 2017-08-31 DIAGNOSIS — N186 End stage renal disease: Secondary | ICD-10-CM | POA: Insufficient documentation

## 2017-08-31 DIAGNOSIS — Z7982 Long term (current) use of aspirin: Secondary | ICD-10-CM | POA: Diagnosis not present

## 2017-08-31 DIAGNOSIS — Z8673 Personal history of transient ischemic attack (TIA), and cerebral infarction without residual deficits: Secondary | ICD-10-CM | POA: Insufficient documentation

## 2017-08-31 DIAGNOSIS — I5042 Chronic combined systolic (congestive) and diastolic (congestive) heart failure: Secondary | ICD-10-CM | POA: Diagnosis not present

## 2017-08-31 DIAGNOSIS — T82590A Other mechanical complication of surgically created arteriovenous fistula, initial encounter: Secondary | ICD-10-CM | POA: Diagnosis not present

## 2017-08-31 DIAGNOSIS — T82898A Other specified complication of vascular prosthetic devices, implants and grafts, initial encounter: Secondary | ICD-10-CM | POA: Diagnosis not present

## 2017-08-31 DIAGNOSIS — Z79899 Other long term (current) drug therapy: Secondary | ICD-10-CM | POA: Diagnosis not present

## 2017-08-31 DIAGNOSIS — Z992 Dependence on renal dialysis: Secondary | ICD-10-CM | POA: Diagnosis not present

## 2017-08-31 DIAGNOSIS — I132 Hypertensive heart and chronic kidney disease with heart failure and with stage 5 chronic kidney disease, or end stage renal disease: Secondary | ICD-10-CM | POA: Diagnosis not present

## 2017-08-31 DIAGNOSIS — T82519A Breakdown (mechanical) of unspecified cardiac and vascular devices and implants, initial encounter: Secondary | ICD-10-CM | POA: Diagnosis not present

## 2017-08-31 MED ORDER — "THROMBI-PAD 3""X3"" EX PADS"
1.0000 | MEDICATED_PAD | Freq: Once | CUTANEOUS | Status: AC
  Administered 2017-08-31: 1 via TOPICAL
  Filled 2017-08-31: qty 1

## 2017-08-31 NOTE — Discharge Instructions (Signed)
Keep the dressing on until you go to dialysis.

## 2017-08-31 NOTE — ED Provider Notes (Addendum)
[place] DEPT Provider Note   CSN: [id number] Arrival date & time: 08/31/17  1846     History   Chief Complaint Chief Complaint  Patient presents with  . Vascular Access Problem    HPI Trevor [name] is a 71 y.o. male.  71 yo M with a chief complaints of bleeding from his AV fistula after dialysis. The patient had some very mild bleeding and then when he got on the bus to go home he realized he had a significant increase. EMS applied a pressure dressing. He is currently complaining of pain to the left arm because of the dressing.   The history is provided by the patient.  Illness  This is a new problem. The current episode started less than 1 hour ago. The problem occurs constantly. The problem has not changed since onset.Pertinent negatives include no chest pain, no abdominal pain, no headaches and no shortness of breath. Nothing aggravates the symptoms. Nothing relieves the symptoms. He has tried nothing for the symptoms. The treatment provided no relief.    Past Medical History:  Diagnosis Date  . AAA (abdominal aortic aneurysm) ([place])   . Anxiety   . Arthritis   . Blindness   . CHF (congestive heart failure) ([place])   . 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 ESRD on hemodialysis ([place])    started HD 2014-15  . Headache(784.0)   . HTN (hypertension)   . Protein-calorie malnutrition [facility])     Patient Active Problem List   Diagnosis Date Noted  . Pain due to onychomycosis of toenails of both feet 07/30/2017  . 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 BPH (benign prostatic hyperplasia) 07/07/2017  . At risk for adverse drug reaction 07/02/2017  . Ischemia of extremity 06/28/2017  . Pulmonary edema 06/27/2017  . Weakness of both legs 01/25/2017  . Hypertensive urgency 01/24/2017  . 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 Ischemic chest pain 11/01/2016  . Hypoxemia 09/07/2016  . Essential hypertension  03/08/2016  . Orthostatic hypotension 01/12/2016  . Dyslipidemia 01/12/2016  . Cough   . Weakness generalized 12/26/2015  . Chest pain 08/12/2015  . Accelerated hypertension 08/12/2015  . Carotid stenosis   . Stroke ([place]) 04/03/2015  . Abdominal aortic aneurysm ([place]) 03/06/2015  . Generalized weakness 09/08/2014  . Acute encephalopathy 09/07/2014  . Protein-calorie malnutrition, severe ([place]) 05/20/2014  . Hypokalemia 05/19/2014  . Syncope and collapse 05/19/2014  . ESRD on hemodialysis ([place]) 05/19/2014  . Anemia of renal disease 05/19/2014  . Coronary artery disease, non-occlusive:  09/28/2012  . Anemia 09/27/2012  . Secondary hyperparathyroidism ([place]) 09/27/2012  . Non compliance with medical treatment 09/27/2012  . Chronic combined systolic and diastolic CHF (congestive heart failure) ([place]) 10/10/2011  . A-fib ([place]) 10/08/2011  . History of stroke 10/07/2011  . Cocaine abuse ([place]) 10/07/2011  . Severe sinus bradycardia 10/07/2011  . Nonischemic cardiomyopathy ([place]) 09/18/2011  . Tobacco use disorder 08/07/2011  . Bruit 08/07/2011    Past Surgical History:  Procedure Laterality Date  . AXILLARY-FEMORAL BYPASS GRAFT  06/28/2017   Procedure: BYPASS GRAFT RIGHT AXILLA-BIFEMORAL USING 8MM X 30CM AND 8MM X 60CM HEMASHIELD GOLD GRAFTS;  Surgeon: Rosetta Posner, MD;  Location: [facility];  Service: Vascular;;  . CARDIAC CATHETERIZATION N/A 11/01/2016   Procedure: Left Heart Cath and Coronary Angiography;  Surgeon: Lorretta Harp, MD;  Location: [facility] LAB;  Service: Cardiovascular;  Laterality: N/A;  . ENDARTERECTOMY  FEMORAL Bilateral 06/28/2017   Procedure: ENDARTERECTOMY BILATERAL FEMORAL ARTERIES;  Surgeon: Rosetta Posner, MD;  Location: [facility];  Service: Vascular;  Laterality: Bilateral;  . INSERTION OF DIALYSIS CATHETER  09/21/2012   Procedure: INSERTION OF DIALYSIS CATHETER;  Surgeon: Angelia Mould, MD;  Location: [place] [facility];  Service: Vascular;  Laterality: N/A;  Right  Internal Jugular Placement  . LEFT HEART CATHETERIZATION WITH CORONARY ANGIOGRAM N/A 09/28/2012   Procedure: LEFT HEART CATHETERIZATION WITH CORONARY ANGIOGRAM;  Surgeon: Sherren Mocha, MD;  Location: [facility] CATH LAB;  Service: Cardiovascular;  Laterality: N/A;  . LEFT HEART CATHETERIZATION WITH CORONARY ANGIOGRAM N/A 07/07/2014   Procedure: LEFT HEART CATHETERIZATION WITH CORONARY ANGIOGRAM;  Surgeon: Leonie Man, MD;  Location: [facility] CATH LAB;  Service: Cardiovascular;  Laterality: N/A;  . right hand    . TEE WITHOUT CARDIOVERSION  10/09/2011   Procedure: TRANSESOPHAGEAL ECHOCARDIOGRAM (TEE);  Surgeon: Lelon Perla, MD;  Location: [facility] ENDOSCOPY;  Service: Cardiovascular;  Laterality: N/A;       Home Medications    Prior to Admission medications   Medication Sig Start Date End Date Taking? Authorizing Provider  Amino Acids-Protein Hydrolys (FEEDING SUPPLEMENT, PRO-STAT SUGAR FREE 64,) LIQD Take 30 mLs by mouth 2 (two) times daily.    [provider]  amLODipine (NORVASC) 10 MG tablet Take 10 mg by mouth every evening. 04/13/17   [provider]  aspirin 81 MG chewable tablet Chew 1 tablet (81 mg total) by mouth daily. 11/04/16   Lyda Jester M, PA-C  atorvastatin (LIPITOR) 40 MG tablet Take 40 mg by mouth at bedtime.    [provider]  clopidogrel (PLAVIX) 75 MG tablet Take 1 tablet (75 mg total) by mouth daily. 04/05/15   Barton Dubois, MD  docusate sodium (COLACE) 100 MG capsule Take 200 mg by mouth daily.     [provider]  gabapentin (NEURONTIN) 100 MG capsule Take 1 capsule (100 mg total) by mouth 3 (three) times daily. 04/05/15   Barton Dubois, MD  isosorbide-hydrALAZINE (BIDIL) 20-37.5 MG tablet Take 1 tablet by mouth 3 (three) times daily. 11/03/16   Lyda Jester M, PA-C  metoprolol tartrate (LOPRESSOR) 25 MG tablet Take 1 tablet (25 mg total) by mouth 2 (two) times daily. 11/03/16   Lyda Jester M, PA-C  multivitamin (RENA-VIT) TABS  tablet Take 1 tablet by mouth at bedtime. 07/02/17   Velvet Bathe, MD  Nutritional Supplements (FEEDING SUPPLEMENT, NEPRO CARB STEADY,) LIQD Take 237 mLs by mouth 2 (two) times daily between meals. 12/11/16   Geradine Girt, DO  Omega-3 Fatty Acids (FISH OIL) 1000 MG CAPS Take 1,000 mg by mouth 2 (two) times daily.    [provider]    Family History Family History  Problem Relation Age of Onset  . Heart attack Mother        MI in her 69s  . Diabetes Mother   . Alcohol abuse Father   . Anesthesia problems Neg Hx   . Hypotension Neg Hx   . Malignant hyperthermia Neg Hx   . Pseudochol deficiency Neg Hx     Social History Social History  Substance Use Topics  . Smoking status: Former Smoker    Packs/day: 0.50    Years: 50.00    Types: Cigarettes  . Smokeless tobacco: Never Used     Comment: quit 05/2017  . Alcohol use No     Comment: Occasional     Allergies   Patient has no known allergies.  Review of Systems Review of Systems  Constitutional: Negative for chills and fever.  HENT: Negative for congestion and facial swelling.   Eyes: Negative for discharge and visual disturbance.  Respiratory: Negative for shortness of breath.   Cardiovascular: Negative for chest pain and palpitations.  Gastrointestinal: Negative for abdominal pain, diarrhea and vomiting.  Musculoskeletal: Negative for arthralgias and myalgias.  Skin: Positive for wound. Negative for color change and rash.  Neurological: Negative for tremors, syncope and headaches.  Psychiatric/Behavioral: Negative for confusion and dysphoric mood.     Physical Exam Updated Vital Signs BP (!) 178/103 (BP Location: Right Arm)   Pulse 64   Temp (!) 97.4 F (36.3 C) (Oral)   Resp 16   SpO2 100%   Physical Exam  Constitutional: He is oriented to person, place, and time. He appears well-developed and well-nourished.  HENT:  Head: Normocephalic and atraumatic.  Eyes: Pupils are equal, round, and  reactive to light. EOM are normal.  Neck: Normal range of motion. Neck supple. No JVD present.  Cardiovascular: Normal rate and regular rhythm.  Exam reveals no gallop and no friction rub.   No murmur heard. Pulmonary/Chest: No respiratory distress. He has no wheezes.  Abdominal: He exhibits no distension. There is no rebound and no guarding.  Musculoskeletal: Normal range of motion.  Neurological: He is alert and oriented to person, place, and time.  Skin: No rash noted. No pallor.  2 incisions were noted to the left AV fistula. Not bleeding.  Palpable thrill  Psychiatric: He has a normal mood and affect. His behavior is normal.  Nursing note and vitals reviewed.    ED Treatments / Results  Labs (all labs ordered are listed, but only abnormal results are displayed) Labs Reviewed - No data to display  EKG  EKG Interpretation None       Radiology No results found.  Procedures Wound repair Date/Time: 08/31/2017 7:28 PM Performed by: Deno Etienne Authorized by: Deno Etienne  Consent: Verbal consent obtained. Risks and benefits: risks, benefits and alternatives were discussed Consent given by: patient Patient understanding: patient does not state understanding of the procedure being performed Patient consent: the patient's understanding of the procedure does not match consent given Procedure consent: procedure consent does not match procedure scheduled Required items: required blood products, implants, devices, and special equipment available Time out: Immediately prior to procedure a "time out" was called to verify the correct patient, procedure, equipment, support staff and site/side marked as required. Local anesthesia used: no  Anesthesia: Local anesthesia used: no  Sedation: Patient sedated: no Patient tolerance: Patient tolerated the procedure well with no immediate complications Comments: Thrombin pad applied to area of bleeding.     (including critical care  time)  Medications Ordered in ED Medications  THROMBI-PAD 3"X3" pad 1 each (not administered)     Initial Impression / Assessment and Plan / ED Course  I have reviewed the triage vital signs and the nursing notes.  Pertinent labs & imaging results that were available during my care of the patient were reviewed by me and considered in my medical decision making (see chart for details).     71 yo M with bleeding from his left AV fistula. This seems to have resolved with direct pressure. Patient is somewhat hesitant about starting to bleed again. thrombin pads were applied. Dressing applied. Discharge home.  7:31 PM:  I have discussed the diagnosis/risks/treatment options with the patient and believe the pt to be eligible for discharge home to  follow-up with PCP. We also discussed returning to the ED immediately if new or worsening sx occur. We discussed the sx which are most concerning (e.g., sudden worsening pain, fever, inability to tolerate by mouth) that necessitate immediate return. Medications administered to the patient during their visit and any new prescriptions provided to the patient are listed below.  Medications given during this visit Medications  THROMBI-PAD 3"X3" pad 1 each (not administered)     The patient appears reasonably screen and/or stabilized for discharge and I doubt any other medical condition or other [facility] requiring further screening, evaluation, or treatment in the ED at this time prior to discharge.    Final Clinical Impressions(s) / ED Diagnoses   Final diagnoses:  Bleeding    New Prescriptions New Prescriptions   No medications on file     Deno Etienne, DO 08/31/17 [place], [place], DO 08/31/17 1931

## 2017-08-31 NOTE — ED Notes (Signed)
Awaiting PTAR. 

## 2017-08-31 NOTE — ED Triage Notes (Signed)
Per EMS, pt from [facility] with c/o left fistula bleed. Staff trying to control bleeding x 1 hour without success. EMS applied israeli bandage, bleeding controlled at this time. BP 134/76, HR-82

## 2017-08-31 NOTE — ED Notes (Signed)
Patient Alert and oriented X4. Stable and ambulatory. Patient verbalized understanding of the discharge instructions.  Patient belongings were taken by the patient. PTAR to transport to [facility].

## 2017-09-02 ENCOUNTER — Non-Acute Institutional Stay (SKILLED_NURSING_FACILITY): Payer: Medicare Other | Admitting: Adult Health

## 2017-09-02 ENCOUNTER — Encounter: Payer: Self-pay | Admitting: Adult Health

## 2017-09-02 DIAGNOSIS — I10 Essential (primary) hypertension: Secondary | ICD-10-CM

## 2017-09-02 DIAGNOSIS — D638 Anemia in other chronic diseases classified elsewhere: Secondary | ICD-10-CM

## 2017-09-02 DIAGNOSIS — G629 Polyneuropathy, unspecified: Secondary | ICD-10-CM

## 2017-09-02 DIAGNOSIS — Z992 Dependence on renal dialysis: Secondary | ICD-10-CM

## 2017-09-02 DIAGNOSIS — I5042 Chronic combined systolic (congestive) and diastolic (congestive) heart failure: Secondary | ICD-10-CM | POA: Diagnosis not present

## 2017-09-02 DIAGNOSIS — N186 End stage renal disease: Secondary | ICD-10-CM

## 2017-09-02 DIAGNOSIS — K828 Other specified diseases of gallbladder: Secondary | ICD-10-CM | POA: Diagnosis not present

## 2017-09-02 DIAGNOSIS — Z8673 Personal history of transient ischemic attack (TIA), and cerebral infarction without residual deficits: Secondary | ICD-10-CM

## 2017-09-02 NOTE — Progress Notes (Signed)
DATE:  09/02/2017   MRN:  [id number]  BIRTHDAY: July 15, 1946  Facility:  Nursing Home Location:  [facility] Room Number: 128-B  LEVEL OF CARE:  [name] (31)  Contact Information    Name Relation Home Work Mobile   [place] Daughter  [phone]   Jarreau, Callanan [phone]     Jevin, [place] Sister   [phone]   Hines,Nancy Sister [phone]  [phone]       Code Status History    Date Active Date Inactive Code Status Order ID Comments User Context   06/28/2017  1:39 PM 07/02/2017  8:47 PM Full Code [id number]  Rondel Jumbo, PA-C ED   01/25/2017 12:21 AM 01/26/2017 10:49 PM Full Code [id number]  Everrett Coombe, MD Inpatient   12/08/2016  5:03 AM 12/12/2016 12:17 AM Full Code [id number]  Norval Morton, MD ED   11/01/2016  1:44 AM 11/04/2016 11:26 AM Full Code [id number]  Lorretta Harp, MD Inpatient   09/07/2016 11:36 AM 09/08/2016  7:59 PM Full Code [id number]  Maren Reamer, MD ED   03/09/2016 12:23 AM 03/12/2016 11:38 PM Full Code [id number]  Theressa Millard, MD Inpatient   12/26/2015  8:45 PM 01/03/2016  7:31 PM Full Code [id number]  Domenic Polite, MD Inpatient   08/12/2015 10:14 PM 08/18/2015  5:29 PM Full Code [id number]  Theressa Millard, MD Inpatient   04/04/2015 12:18 AM 04/05/2015  9:57 PM Full Code [id number]  Ivor Costa, MD Inpatient   09/08/2014  1:15 AM 09/09/2014  6:50 PM Full Code [id number]  Ivor Costa, MD Inpatient   07/08/2014 12:58 PM 07/11/2014  7:23 PM Full Code [id number]  [name], MD Inpatient   07/07/2014 12:31 PM 07/08/2014 12:58 PM Full Code [id number]  Leonie Man, MD Inpatient   07/05/2014  9:46 PM 07/07/2014 12:31 PM Full Code [id number]  Etta Quill, DO ED   05/19/2014 11:18 PM 05/20/2014  4:26 PM Full Code [id number]  Theressa Millard, MD Inpatient   09/17/2012  5:32 PM 10/02/2012  1:01 AM Full Code [id number]  Velvet Bathe, MD ED   05/05/2012 10:43 PM 05/08/2012  3:21 PM Full Code [id number]  Matilde Bash,  RN Inpatient       Chief Complaint  Patient presents with  . Medical Management of Chronic Issues    Routine visit    HISTORY OF PRESENT ILLNESS:  This is a 56-YO male seen for a routine visit.  He is a long-term care resident at [facility].  He has a PMH of stroke, hepatitis C, HLD, HTN, and is partially blind. He was seen in his room today. No bleeding noted on his left upper arm AV fistula. He was sent to ED yesterday due to AV fistula bleeding. Bleeding stopped and was sent back to facility. He is getting ready today to be picked up by transportation for dialysis. She was recently started on Auryxia for anemia.    PAST MEDICAL HISTORY:  Past Medical History:  Diagnosis Date  . AAA (abdominal aortic aneurysm) ([name])   . Anxiety   . Arthritis   . Blindness   . CHF (congestive heart failure) ([place])   . 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 ESRD on hemodialysis ([place])    started HD 2014-15  . Headache(784.0)   . HTN (hypertension)   . Protein-calorie malnutrition ([place])  CURRENT MEDICATIONS: Reviewed  Patient's Medications  New Prescriptions   No medications on file  Previous Medications   AMINO ACIDS-PROTEIN HYDROLYS (FEEDING SUPPLEMENT, PRO-STAT SUGAR FREE 64,) LIQD    Take 30 mLs by mouth 2 (two) times daily.   AMLODIPINE (NORVASC) 10 MG TABLET    Take 10 mg by mouth every evening.   ASPIRIN 81 MG CHEWABLE TABLET    Chew 1 tablet (81 mg total) by mouth daily.   ATORVASTATIN (LIPITOR) 40 MG TABLET    Take 40 mg by mouth at bedtime.   CLOPIDOGREL (PLAVIX) 75 MG TABLET    Take 1 tablet (75 mg total) by mouth daily.   DOCUSATE SODIUM (COLACE) 100 MG CAPSULE    Take 200 mg by mouth daily.    GABAPENTIN (NEURONTIN) 100 MG CAPSULE    Take 1 capsule (100 mg total) by mouth 3 (three) times daily.   ISOSORBIDE-HYDRALAZINE (BIDIL) 20-37.5 MG TABLET    Take 1 tablet by mouth 3 (three) times daily.    METOPROLOL TARTRATE (LOPRESSOR) 25 MG TABLET    Take 1 tablet (25 mg total) by mouth 2 (two) times daily.   MULTIVITAMIN (RENA-VIT) TABS TABLET    Take 1 tablet by mouth at bedtime.   NUTRITIONAL SUPPLEMENTS (FEEDING SUPPLEMENT, NEPRO CARB STEADY,) LIQD    Take 237 mLs by mouth 2 (two) times daily between meals.   OMEGA-3 FATTY ACIDS (FISH OIL) 1000 MG CAPS    Take 1,000 mg by mouth 2 (two) times daily.  Modified Medications   No medications on file  Discontinued Medications   No medications on file     No Known Allergies   REVIEW OF SYSTEMS:  GENERAL: no change in appetite, no fatigue, no weight changes, no fever, chills or weakness MOUTH and THROAT: Denies oral discomfort RESPIRATORY: no cough, SOB, DOE, wheezing, hemoptysis CARDIAC: no chest pain, edema or palpitations GI: no abdominal pain, diarrhea, constipation, heart burn, nausea or vomiting GU: Denies dysuria, frequency, hematuria, incontinence, or discharge PSYCHIATRIC: Denies feeling of depression or anxiety. No report of hallucinations, insomnia, paranoia, or agitation     PHYSICAL EXAMINATION  GENERAL APPEARANCE:  In no acute distress.  MOUTH and THROAT: Lips are without lesions. Oral mucosa is moist and without lesions.  RESPIRATORY: breathing is even & unlabored, BS CTAB CARDIAC: RRR, no murmur,no extra heart sounds, no edema GI: abdomen soft, normal BS, no masses, no tenderness, no hepatomegaly, no splenomegaly EXTREMITIES:  Able to move X 4 extremities, Left upper arm AV fistula + bruit/thrill PSYCHIATRIC: Alert and oriented X 3. Affect and behavior are appropriate   LABS/RADIOLOGY: Labs reviewed: Basic Metabolic Panel:  Recent Labs  11/03/16 1820  12/08/16 1504 12/10/16 1834  06/27/17 1302 06/28/17 1125 06/28/17 1133 06/28/17 1627 06/29/17 0439  NA 136  < > 142 134*  < > 136 139 140 138 138  K 5.4*  < > 4.5 2.9*  < > 3.2* 3.5 3.4* 3.8 4.4  CL 98*  < > 104 98*  < > 97* 98* 98*  --  100*  CO2 25   < > 28 27  < > 30 24  --   --  27  GLUCOSE 122*  < > 103* 89  < > 88 140* 138* 105* 129*  BUN 43*  < > 37* 9  < > <5* 14 14  --  24*  CREATININE 11.22*  < > 10.46* 3.00*  < > 2.98* 6.12* 6.40*  --  7.22*  CALCIUM  9.5  < > 9.1 8.8*  < > 8.8* 9.5  --   --  8.4*  PHOS 4.7*  --  4.2 1.5*  --   --   --   --   --   --   < > = values in this interval not displayed. Liver Function Tests:  Recent Labs  06/27/17 1302 06/28/17 1125 06/29/17 0439  AST 13* 16 37  ALT 6* 8* 13*  ALKPHOS 66 73 61  BILITOT 0.9 0.9 0.8  PROT 6.7 6.9 5.9*  ALBUMIN 3.2* 3.5 3.0*   CBC:  Recent Labs  06/27/17 1302 06/28/17 1125  06/28/17 1627 07/01/17 1028 08/08/17  WBC 3.7* 6.6  --   --  6.9 6.4  NEUTROABS 2.3 3.9  --   --  4.9 3  HGB 8.8* 9.3*  < > 6.8* 9.5* 10.8*  HCT 26.5* 27.5*  < > 20.0* 29.0* 33*  MCV 87.7 87.0  --   --  86.6  --   PLT 127* 151  --   --  124* 141*  < > = values in this interval not displayed. Lipid Panel:  Recent Labs  11/01/16 0012 11/01/16 0506 01/24/17 1935  HDL 38* 33* 37*   Cardiac Enzymes:  Recent Labs  01/25/17 0059 01/25/17 0714 01/25/17 1148 06/28/17 1125  CKTOTAL  --   --   --  79  TROPONINI 0.24* 0.47* 0.42*  --    CBG:  Recent Labs  11/02/16 0626  GLUCAP 91       ASSESSMENT/PLAN:  1. ESRD on hemodialysis ([place]) - Continue hemodialysis MWF, monitor for bleeding on AV fistula   2. History of CVA (cerebrovascular accident) - stable, continue Plavix 75 mg 1 tab daily Norvasc 10 mg 1 tab by mouth daily, isosorbide dinitrate 20 mg 1 tab 3 times a day and Lipitor 40 mg 1 tab daily evening   3. Anemia in other chronic diseases classified elsewhere - continue Auryxia 210 mg 1 tab TID Lab Results  Component Value Date   HGB 10.8 (A) 08/08/2017    4. Porcelain gallbladder - follows-up with Dr. Barry Dienes   5. Neuropathy - stable, continue Neurontin 100 mg 1 capsule 3 times daily   6. Chronic combined systolic and diastolic CHF (congestive heart  failure) (H[facility]) - no SOB, continue hydralazine 25 mg 1 tab 3 times a day, isosorbide dinitrate 20 mg 1 tab 3 times a day   7. Essential hypertension - well-controlled, continue hydralazine 25 mg 1 tab 3 times a day, Norvasc 10 mg 1 tab every evening     Goals of care:  Long-term care    Agape Hardiman C. Anderson - NP    [employer] [phone]

## 2017-09-03 DIAGNOSIS — R262 Difficulty in walking, not elsewhere classified: Secondary | ICD-10-CM | POA: Diagnosis not present

## 2017-09-03 DIAGNOSIS — I69398 Other sequelae of cerebral infarction: Secondary | ICD-10-CM | POA: Diagnosis not present

## 2017-09-04 DIAGNOSIS — I69398 Other sequelae of cerebral infarction: Secondary | ICD-10-CM | POA: Diagnosis not present

## 2017-09-04 DIAGNOSIS — R262 Difficulty in walking, not elsewhere classified: Secondary | ICD-10-CM | POA: Diagnosis not present

## 2017-09-07 DIAGNOSIS — R262 Difficulty in walking, not elsewhere classified: Secondary | ICD-10-CM | POA: Diagnosis not present

## 2017-09-07 DIAGNOSIS — I69398 Other sequelae of cerebral infarction: Secondary | ICD-10-CM | POA: Diagnosis not present

## 2017-09-08 DIAGNOSIS — I69398 Other sequelae of cerebral infarction: Secondary | ICD-10-CM | POA: Diagnosis not present

## 2017-09-08 DIAGNOSIS — R262 Difficulty in walking, not elsewhere classified: Secondary | ICD-10-CM | POA: Diagnosis not present

## 2017-09-09 DIAGNOSIS — I69398 Other sequelae of cerebral infarction: Secondary | ICD-10-CM | POA: Diagnosis not present

## 2017-09-09 DIAGNOSIS — R262 Difficulty in walking, not elsewhere classified: Secondary | ICD-10-CM | POA: Diagnosis not present

## 2017-09-10 DIAGNOSIS — R262 Difficulty in walking, not elsewhere classified: Secondary | ICD-10-CM | POA: Diagnosis not present

## 2017-09-10 DIAGNOSIS — I69398 Other sequelae of cerebral infarction: Secondary | ICD-10-CM | POA: Diagnosis not present

## 2017-09-11 DIAGNOSIS — I69398 Other sequelae of cerebral infarction: Secondary | ICD-10-CM | POA: Diagnosis not present

## 2017-09-11 DIAGNOSIS — R262 Difficulty in walking, not elsewhere classified: Secondary | ICD-10-CM | POA: Diagnosis not present

## 2017-09-14 ENCOUNTER — Encounter (HOSPITAL_COMMUNITY): Payer: Self-pay | Admitting: Emergency Medicine

## 2017-09-14 ENCOUNTER — Emergency Department (HOSPITAL_COMMUNITY)
Admission: EM | Admit: 2017-09-14 | Discharge: 2017-09-14 | Disposition: A | Discharge status: Home / Self Care | Payer: Medicare Other | Attending: Emergency Medicine | Admitting: Emergency Medicine

## 2017-09-14 DIAGNOSIS — Z87891 Personal history of nicotine dependence: Secondary | ICD-10-CM | POA: Diagnosis not present

## 2017-09-14 DIAGNOSIS — T82838A Hemorrhage of vascular prosthetic devices, implants and grafts, initial encounter: Secondary | ICD-10-CM | POA: Diagnosis not present

## 2017-09-14 DIAGNOSIS — Y802 Prosthetic and other implants, materials and accessory physical medicine devices associated with adverse incidents: Secondary | ICD-10-CM | POA: Insufficient documentation

## 2017-09-14 DIAGNOSIS — I2510 Atherosclerotic heart disease of native coronary artery without angina pectoris: Secondary | ICD-10-CM | POA: Diagnosis not present

## 2017-09-14 DIAGNOSIS — I132 Hypertensive heart and chronic kidney disease with heart failure and with stage 5 chronic kidney disease, or end stage renal disease: Secondary | ICD-10-CM | POA: Insufficient documentation

## 2017-09-14 DIAGNOSIS — Z7982 Long term (current) use of aspirin: Secondary | ICD-10-CM | POA: Insufficient documentation

## 2017-09-14 DIAGNOSIS — T83498A Other mechanical complication of other prosthetic devices, implants and grafts of genital tract, initial encounter: Secondary | ICD-10-CM | POA: Diagnosis not present

## 2017-09-14 DIAGNOSIS — Z95820 Peripheral vascular angioplasty status with implants and grafts: Secondary | ICD-10-CM | POA: Diagnosis not present

## 2017-09-14 DIAGNOSIS — I509 Heart failure, unspecified: Secondary | ICD-10-CM | POA: Insufficient documentation

## 2017-09-14 DIAGNOSIS — Z992 Dependence on renal dialysis: Secondary | ICD-10-CM | POA: Diagnosis not present

## 2017-09-14 DIAGNOSIS — Z7902 Long term (current) use of antithrombotics/antiplatelets: Secondary | ICD-10-CM | POA: Insufficient documentation

## 2017-09-14 DIAGNOSIS — T83198A Other mechanical complication of other urinary devices and implants, initial encounter: Secondary | ICD-10-CM | POA: Diagnosis not present

## 2017-09-14 DIAGNOSIS — N186 End stage renal disease: Secondary | ICD-10-CM | POA: Diagnosis not present

## 2017-09-14 DIAGNOSIS — R262 Difficulty in walking, not elsewhere classified: Secondary | ICD-10-CM | POA: Diagnosis not present

## 2017-09-14 DIAGNOSIS — Z79899 Other long term (current) drug therapy: Secondary | ICD-10-CM | POA: Insufficient documentation

## 2017-09-14 DIAGNOSIS — T8249XA Other complication of vascular dialysis catheter, initial encounter: Secondary | ICD-10-CM | POA: Diagnosis not present

## 2017-09-14 DIAGNOSIS — I69398 Other sequelae of cerebral infarction: Secondary | ICD-10-CM | POA: Diagnosis not present

## 2017-09-14 LAB — CBC WITH DIFFERENTIAL/PLATELET
Basophils Absolute: 0.1 10*3/uL (ref 0.0–0.1)
Basophils Relative: 2 %
EOS ABS: 0.6 10*3/uL (ref 0.0–0.7)
EOS PCT: 11 %
HCT: 34 % — ABNORMAL LOW (ref 39.0–52.0)
Hemoglobin: 11.7 g/dL — ABNORMAL LOW (ref 13.0–17.0)
LYMPHS ABS: 1.2 10*3/uL (ref 0.7–4.0)
LYMPHS PCT: 21 %
MCH: 29.2 pg (ref 26.0–34.0)
MCHC: 34.4 g/dL (ref 30.0–36.0)
MCV: 84.8 fL (ref 78.0–100.0)
MONO ABS: 0.7 10*3/uL (ref 0.1–1.0)
Monocytes Relative: 12 %
Neutro Abs: 2.9 10*3/uL (ref 1.7–7.7)
Neutrophils Relative %: 54 %
PLATELETS: 196 10*3/uL (ref 150–400)
RBC: 4.01 MIL/uL — ABNORMAL LOW (ref 4.22–5.81)
RDW: 14.2 % (ref 11.5–15.5)
WBC: 5.4 10*3/uL (ref 4.0–10.5)

## 2017-09-14 LAB — I-STAT CHEM 8, ED
BUN: 25 mg/dL — ABNORMAL HIGH (ref 6–20)
CALCIUM ION: 0.93 mmol/L — AB (ref 1.15–1.40)
CREATININE: 4.5 mg/dL — AB (ref 0.61–1.24)
Chloride: 96 mmol/L — ABNORMAL LOW (ref 101–111)
GLUCOSE: 92 mg/dL (ref 65–99)
HCT: 35 % — ABNORMAL LOW (ref 39.0–52.0)
HEMOGLOBIN: 11.9 g/dL — AB (ref 13.0–17.0)
Potassium: 3.6 mmol/L (ref 3.5–5.1)
Sodium: 136 mmol/L (ref 135–145)
TCO2: 31 mmol/L (ref 22–32)

## 2017-09-14 LAB — APTT: APTT: 28 s (ref 24–36)

## 2017-09-14 NOTE — Discharge Instructions (Signed)
Follow-up with your dialysis Center. Return for increased bleeding.

## 2017-09-14 NOTE — ED Provider Notes (Signed)
[facility] EMERGENCY DEPARTMENT Provider Note   CSN: [id number] Arrival date & time: 09/14/17  1816     History   Chief Complaint Chief Complaint  Patient presents with  . Coagulation Disorder    HPI Trevor [name] is a 71 y.o. male.  HPI Patient is a dialysis patient was dialyzed earlier today. The last 2 hours as had been bleeding from the dialysis site. Reportedly from both insertion points. Upon arrival to the ER bleeding has decreased.no lightheadedness or dizziness. Patient states there was a fair amount of blood loss. Has had difficulty with bleeding like this in the past.He is not on aniicoagulation. Past Medical History:  Diagnosis Date  . AAA (abdominal aortic aneurysm) ([place])   . Anxiety   . Arthritis   . Blindness   . CHF (congestive heart failure) ([place])   . 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 ESRD on hemodialysis ([place])    started HD 2014-15  . Headache(784.0)   . HTN (hypertension)   . Protein-calorie malnutrition [facility])     Patient Active Problem List   Diagnosis Date Noted  . Pain due to onychomycosis of toenails of both feet 07/30/2017  . 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 BPH (benign prostatic hyperplasia) 07/07/2017  . At risk for adverse drug reaction 07/02/2017  . Ischemia of extremity 06/28/2017  . Pulmonary edema 06/27/2017  . Weakness of both legs 01/25/2017  . Hypertensive urgency 01/24/2017  . 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 Ischemic chest pain 11/01/2016  . Hypoxemia 09/07/2016  . Essential hypertension 03/08/2016  . Orthostatic hypotension 01/12/2016  . Dyslipidemia 01/12/2016  . Cough   . Weakness generalized 12/26/2015  . Chest pain 08/12/2015  . Accelerated hypertension 08/12/2015  . Carotid stenosis   . Stroke ([place]) 04/03/2015  . Abdominal aortic aneurysm ([place]) 03/06/2015  . Generalized weakness 09/08/2014  . Acute encephalopathy  09/07/2014  . Protein-calorie malnutrition, severe ([place]) 05/20/2014  . Hypokalemia 05/19/2014  . Syncope and collapse 05/19/2014  . ESRD on hemodialysis ([place]) 05/19/2014  . Anemia of renal disease 05/19/2014  . Coronary artery disease, non-occlusive:  09/28/2012  . Anemia 09/27/2012  . Secondary hyperparathyroidism ([place]) 09/27/2012  . Non compliance with medical treatment 09/27/2012  . Chronic combined systolic and diastolic CHF (congestive heart failure) ([place]) 10/10/2011  . A-fib ([place]) 10/08/2011  . History of stroke 10/07/2011  . Cocaine abuse ([place]) 10/07/2011  . Severe sinus bradycardia 10/07/2011  . Nonischemic cardiomyopathy ([place]) 09/18/2011  . Tobacco use disorder 08/07/2011  . Bruit 08/07/2011    Past Surgical History:  Procedure Laterality Date  . AXILLARY-FEMORAL BYPASS GRAFT  06/28/2017   Procedure: BYPASS GRAFT RIGHT AXILLA-BIFEMORAL USING 8MM X 30CM AND 8MM X 60CM HEMASHIELD GOLD GRAFTS;  Surgeon: Rosetta Posner, MD;  Location: [facility];  Service: Vascular;;  . CARDIAC CATHETERIZATION N/A 11/01/2016   Procedure: Left Heart Cath and Coronary Angiography;  Surgeon: Lorretta Harp, MD;  Location: [facility] CV LAB;  Service: Cardiovascular;  Laterality: N/A;  . ENDARTERECTOMY FEMORAL Bilateral 06/28/2017   Procedure: ENDARTERECTOMY BILATERAL FEMORAL ARTERIES;  Surgeon: Rosetta Posner, MD;  Location: [facility];  Service: Vascular;  Laterality: Bilateral;  . INSERTION OF DIALYSIS CATHETER  09/21/2012   Procedure: INSERTION OF DIALYSIS CATHETER;  Surgeon: Angelia Mould, MD;  Location: [facility];  Service: Vascular;  Laterality: N/A;  Right Internal Jugular Placement  . LEFT HEART CATHETERIZATION WITH CORONARY  ANGIOGRAM N/A 09/28/2012   Procedure: LEFT HEART CATHETERIZATION WITH CORONARY ANGIOGRAM;  Surgeon: Sherren Mocha, MD;  Location[facility] LAB;  Service: Cardiovascular;  Laterality: N/A;  . LEFT HEART CATHETERIZATION WITH CORONARY ANGIOGRAM N/A 07/07/2014   Procedure: LEFT HEART  CATHETERIZATION WITH CORONARY ANGIOGRAM;  Surgeon: Leonie Man, MD;  Location: [facility] CATH LAB;  Service: Cardiovascular;  Laterality: N/A;  . right hand    . TEE WITHOUT CARDIOVERSION  10/09/2011   Procedure: TRANSESOPHAGEAL ECHOCARDIOGRAM (TEE);  Surgeon: Lelon Perla, MD;  Location: [facility] ENDOSCOPY;  Service: Cardiovascular;  Laterality: N/A;       Home Medications    Prior to Admission medications   Medication Sig Start Date End Date Taking? Authorizing Provider  Amino Acids-Protein Hydrolys (FEEDING SUPPLEMENT, PRO-STAT SUGAR FREE 64,) LIQD Take 30 mLs by mouth 2 (two) times daily.    [provider]  amLODipine (NORVASC) 10 MG tablet Take 10 mg by mouth every evening. 04/13/17   [provider]  aspirin 81 MG chewable tablet Chew 1 tablet (81 mg total) by mouth daily. 11/04/16   Lyda Jester M, PA-C  atorvastatin (LIPITOR) 40 MG tablet Take 40 mg by mouth at bedtime.    [provider]  clopidogrel (PLAVIX) 75 MG tablet Take 1 tablet (75 mg total) by mouth daily. 04/05/15   Barton Dubois, MD  docusate sodium (COLACE) 100 MG capsule Take 200 mg by mouth daily.     [provider]  ferric citrate (AURYXIA) 1 GM 210 MG(Fe) tablet Take 210 mg by mouth 3 (three) times daily with meals.    [provider]  gabapentin (NEURONTIN) 100 MG capsule Take 1 capsule (100 mg total) by mouth 3 (three) times daily. 04/05/15   Barton Dubois, MD  hydrALAZINE (APRESOLINE) 25 MG tablet Take 25 mg by mouth 3 (three) times daily.    [provider]  isosorbide-hydrALAZINE (BIDIL) 20-37.5 MG tablet Take 1 tablet by mouth 3 (three) times daily. 11/03/16   Lyda Jester M, PA-C  metoprolol tartrate (LOPRESSOR) 25 MG tablet Take 1 tablet (25 mg total) by mouth 2 (two) times daily. 11/03/16   Lyda Jester M, PA-C  multivitamin (RENA-VIT) TABS tablet Take 1 tablet by mouth at bedtime. 07/02/17   Velvet Bathe, MD  Nutritional Supplements (FEEDING  SUPPLEMENT, NEPRO CARB STEADY,) LIQD Take 237 mLs by mouth 2 (two) times daily between meals. 12/11/16   Geradine Girt, DO  Omega-3 Fatty Acids (FISH OIL) 1000 MG CAPS Take 1,000 mg by mouth 2 (two) times daily.    [provider]    Family History Family History  Problem Relation Age of Onset  . Heart attack Mother        MI in her 79s  . Diabetes Mother   . Alcohol abuse Father   . Anesthesia problems Neg Hx   . Hypotension Neg Hx   . Malignant hyperthermia Neg Hx   . Pseudochol deficiency Neg Hx     Social History Social History  Substance Use Topics  . Smoking status: Former Smoker    Packs/day: 0.50    Years: 50.00    Types: Cigarettes  . Smokeless tobacco: Never Used     Comment: quit 05/2017  . Alcohol use No     Comment: Occasional     Allergies   Patient has no known allergies.   Review of Systems Review of Systems  Constitutional: Negative for appetite change.  Respiratory: Negative for chest tightness and shortness of  breath.   Neurological: Negative for light-headedness.  Hematological: Bruises/bleeds easily.     Physical Exam Updated Vital Signs BP (!) 157/70   Pulse 73   Temp 97.6 F (36.4 C) (Oral)   Resp 17   SpO2 99%   Physical Exam  Constitutional: He appears well-developed and well-nourished.  Cardiovascular: Normal rate.   Musculoskeletal:  Dialysis graft to left upper arm. Unwrapped and did not have active bleeding. Rewrapped. Neurovascular intact left hand.Pulse and thrill still intact  Skin: Skin is warm.     ED Treatments / Results  Labs (all labs ordered are listed, but only abnormal results are displayed) Labs Reviewed  CBC WITH DIFFERENTIAL/PLATELET - Abnormal; Notable for the following:       Result Value   RBC 4.01 (*)    Hemoglobin 11.7 (*)    HCT 34.0 (*)    All other components within normal limits  I-STAT CHEM 8, ED - Abnormal; Notable for the following:    Chloride 96 (*)    BUN 25 (*)    Creatinine,  Ser 4.50 (*)    Calcium, Ion 0.93 (*)    Hemoglobin 11.9 (*)    HCT 35.0 (*)    All other components within normal limits  APTT    EKG  EKG Interpretation None       Radiology No results found.  Procedures Procedures (including critical care time)  Medications Ordered in ED Medications - No data to display   Initial Impression / Assessment and Plan / ED Course  I have reviewed the triage vital signs and the nursing notes.  Pertinent labs & imaging results that were available during my care of the patient were reviewed by me and considered in my medical decision making (see chart for details).     Patient bleeding dialysis graft.Bleeding controlled. Labs reassuring. Discharge home.  Final Clinical Impressions(s) / ED Diagnoses   Final diagnoses:  Hemorrhage from arteriovenous dialysis graft [facility])    New Prescriptions Discharge Medication List as of 09/14/2017  8:22 PM       Davonna Belling, MD 09/14/17 2258

## 2017-09-14 NOTE — ED Notes (Signed)
PT states understanding of care given, follow up care. PT ambulated from ED to car with a steady gait.  

## 2017-09-14 NOTE — ED Triage Notes (Signed)
Pt arrives via [facility] from dialysis center, ems reports pt finished his dialysis session and fistula in upper left arm would not quit bleeding. Facility held pressure for 1.5 hours with continued bleeding, ems held pressure approx 30 more mins prior to arrival. Pressure still being held on site upon arrival. Dr. Alvino Chapel in to assess patient upon arrival. A/ox4, resp e/u, nad.

## 2017-09-14 NOTE — ED Notes (Signed)
Pt has family member to come pick him up at the hospital to go back to the [place].

## 2017-09-15 DIAGNOSIS — R262 Difficulty in walking, not elsewhere classified: Secondary | ICD-10-CM | POA: Diagnosis not present

## 2017-09-15 DIAGNOSIS — I69398 Other sequelae of cerebral infarction: Secondary | ICD-10-CM | POA: Diagnosis not present

## 2017-09-16 DIAGNOSIS — R262 Difficulty in walking, not elsewhere classified: Secondary | ICD-10-CM | POA: Diagnosis not present

## 2017-09-16 DIAGNOSIS — I69398 Other sequelae of cerebral infarction: Secondary | ICD-10-CM | POA: Diagnosis not present

## 2017-09-24 DIAGNOSIS — R05 Cough: Secondary | ICD-10-CM | POA: Diagnosis not present

## 2017-09-24 DIAGNOSIS — R0989 Other specified symptoms and signs involving the circulatory and respiratory systems: Secondary | ICD-10-CM | POA: Diagnosis not present

## 2017-09-29 ENCOUNTER — Non-Acute Institutional Stay (SKILLED_NURSING_FACILITY): Payer: Medicare Other

## 2017-09-29 DIAGNOSIS — Z0000 Encounter for general adult medical examination without abnormal findings: Secondary | ICD-10-CM | POA: Diagnosis not present

## 2017-09-29 NOTE — Progress Notes (Signed)
Subjective:   Trevor [name] is a 71 y.o. male who presents for an Initial Medicare Annual Wellness Visit at [name]   Objective:    Today's Vitals   09/29/17 1036  BP: (!) 166/64  Pulse: 79  SpO2: 96%  Weight: 128 lb (58.1 kg)  Height: 5\' 10"  (1.778 m)   Body mass index is 18.37 kg/m.  Current Medications (verified) Outpatient Encounter Prescriptions as of 09/29/2017  Medication Sig  . Amino Acids-Protein Hydrolys (FEEDING SUPPLEMENT, PRO-STAT SUGAR FREE 64,) LIQD Take 30 mLs by mouth 2 (two) times daily.  Marland Kitchen amLODipine (NORVASC) 10 MG tablet Take 10 mg by mouth every evening.  Marland Kitchen aspirin 81 MG chewable tablet Chew 1 tablet (81 mg total) by mouth daily.  Marland Kitchen atorvastatin (LIPITOR) 40 MG tablet Take 40 mg by mouth at bedtime.  . clopidogrel (PLAVIX) 75 MG tablet Take 1 tablet (75 mg total) by mouth daily.  Marland Kitchen docusate sodium (COLACE) 100 MG capsule Take 200 mg by mouth daily.   . ferric citrate (AURYXIA) 1 GM 210 MG(Fe) tablet Take 210 mg by mouth 3 (three) times daily with meals.  . fluticasone (FLONASE) 50 MCG/ACT nasal spray Place into both nostrils daily.  Marland Kitchen gabapentin (NEURONTIN) 100 MG capsule Take 1 capsule (100 mg total) by mouth 3 (three) times daily.  . hydrALAZINE (APRESOLINE) 25 MG tablet Take 25 mg by mouth 3 (three) times daily.  . isosorbide mononitrate (IMDUR) 30 MG 24 hr tablet Take 30 mg by mouth daily.  . isosorbide-hydrALAZINE (BIDIL) 20-37.5 MG tablet Take 1 tablet by mouth 3 (three) times daily.  . metoprolol tartrate (LOPRESSOR) 25 MG tablet Take 1 tablet (25 mg total) by mouth 2 (two) times daily.  . multivitamin (RENA-VIT) TABS tablet Take 1 tablet by mouth at bedtime.  . Nutritional Supplements (FEEDING SUPPLEMENT, NEPRO CARB STEADY,) LIQD Take 237 mLs by mouth 2 (two) times daily between meals.  . Omega-3 Fatty Acids (FISH OIL) 1000 MG CAPS Take 1,000 mg by mouth 2 (two) times daily.   No facility-administered encounter medications on  file as of 09/29/2017.     Allergies (verified) Patient has no known allergies.   History: Past Medical History:  Diagnosis Date  . AAA (abdominal aortic aneurysm) ([place])   . Anxiety   . Arthritis   . Blindness   . CHF (congestive heart failure) ([place])   . 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 ESRD on hemodialysis ([place])    started HD 2014-15  . Headache(784.0)   . HTN (hypertension)   . Protein-calorie malnutrition ([place])    Past Surgical History:  Procedure Laterality Date  . AXILLARY-FEMORAL BYPASS GRAFT  06/28/2017   Procedure: BYPASS GRAFT RIGHT AXILLA-BIFEMORAL USING 8MM X 30CM AND 8MM X 60CM HEMASHIELD GOLD GRAFTS;  Surgeon: Rosetta Posner, MD;  Location: [place];  Service: Vascular;;  . CARDIAC CATHETERIZATION N/A 11/01/2016   Procedure: Left Heart Cath and Coronary Angiography;  Surgeon: Lorretta Harp, MD;  Location: [place] CV LAB;  Service: Cardiovascular;  Laterality: N/A;  . ENDARTERECTOMY FEMORAL Bilateral 06/28/2017   Procedure: ENDARTERECTOMY BILATERAL FEMORAL ARTERIES;  Surgeon: Rosetta Posner, MD;  Location: [place];  Service: Vascular;  Laterality: Bilateral;  . INSERTION OF DIALYSIS CATHETER  09/21/2012   Procedure: INSERTION OF DIALYSIS CATHETER;  Surgeon: Angelia Mould, MD;  Location: [place];  Service: Vascular;  Laterality: N/A;  Right Internal  Jugular Placement  . LEFT HEART CATHETERIZATION WITH CORONARY ANGIOGRAM N/A 09/28/2012   Procedure: LEFT HEART CATHETERIZATION WITH CORONARY ANGIOGRAM;  Surgeon: Sherren Mocha, MD;  Location: [facility] CATH LAB;  Service: Cardiovascular;  Laterality: N/A;  . LEFT HEART CATHETERIZATION WITH CORONARY ANGIOGRAM N/A 07/07/2014   Procedure: LEFT HEART CATHETERIZATION WITH CORONARY ANGIOGRAM;  Surgeon: Leonie Man, MD;  Location: [facility] CATH LAB;  Service: Cardiovascular;  Laterality: N/A;  . right hand    . TEE WITHOUT CARDIOVERSION  10/09/2011   Procedure: TRANSESOPHAGEAL  ECHOCARDIOGRAM (TEE);  Surgeon: Lelon Perla, MD;  Location: [facility] ENDOSCOPY;  Service: Cardiovascular;  Laterality: N/A;   Family History  Problem Relation Age of Onset  . Heart attack Mother        MI in her 32s  . Diabetes Mother   . Alcohol abuse Father   . Anesthesia problems Neg Hx   . Hypotension Neg Hx   . Malignant hyperthermia Neg Hx   . Pseudochol deficiency Neg Hx    Social History   Occupational History  . Not on file.   Social History Main Topics  . Smoking status: Former Smoker    Packs/day: 0.50    Years: 50.00    Types: Cigarettes  . Smokeless tobacco: Never Used     Comment: quit 05/2017  . Alcohol use No     Comment: Occasional  . Drug use: No  . Sexual activity: Yes   Tobacco Counseling Counseling given: Not Answered   Activities of Daily Living In your present state of health, do you have any difficulty performing the following activities: 09/29/2017 06/29/2017  Hearing? N N  Vision? Y Y  Comment R eye blind -  Difficulty concentrating or making decisions? N N  Walking or climbing stairs? Y N  Dressing or bathing? N N  Doing errands, shopping? Y [place] and eating ? Y -  Using the Toilet? N -  In the past six months, have you accidently leaked urine? N -  Do you have problems with loss of bowel control? N -  Managing your Medications? Y -  Managing your Finances? Y -  Housekeeping or managing your Housekeeping? Y -  Some recent data might be hidden    Immunizations and Health Maintenance Immunization History  Administered Date(s) Administered  . Influenza,inj,Quad PF,6+ Mos 08/14/2015  . PPD Test 01/17/2016, 07/02/2017  . Pneumococcal Polysaccharide-23 08/14/2015   There are no preventive care reminders to display for this patient.  Patient Care Team: [name] as PCP - General Stanford Breed, [name], MD (Cardiology)  Indicate any recent Medical Services you may have received from other than [facility]  providers in the past year (date may be approximate).    Assessment:   This is a routine wellness examination for Trevor [name].    Hearing/Vision screen No exam data present  Dietary issues and exercise activities discussed: Current Exercise Habits: The patient does not participate in regular exercise at present, Exercise limited by: None identified  Goals    None     Depression Screen PHQ 2/9 Scores 09/29/2017  PHQ - 2 Score 0    Fall Risk Fall Risk  09/29/2017  Falls in the past year? Yes  Number falls in past yr: 2 or more  Injury with Fall? No    Cognitive Function:     6CIT Screen 09/29/2017  What Year? 0 points  What month? 0 points  What time? 0 points  Count back from 20 0 points  Months in reverse 0 points  Repeat phrase 0 points  Total Score 0    Screening Tests Health Maintenance  Topic Date Due  . INFLUENZA VACCINE  02/28/2018 (Originally 07/01/2017)  . PNA vac Low Risk Adult (2 of 2 - PCV13) 08/13/2018 (Originally 08/13/2016)  . COLONOSCOPY  08/08/2027 (Originally 11/06/1995)  . TETANUS/TDAP  02/06/2044 (Originally 11/05/1964)  . Hepatitis C Screening  Completed        Plan:    I have personally reviewed and addressed the Medicare Annual Wellness questionnaire and have noted the following in the patient's chart:  A. Medical and social history B. Use of alcohol, tobacco or illicit drugs  C. Current medications and supplements D. Functional ability and status E.  Nutritional status F.  Physical activity G. Advance directives H. List of other physicians I.  Hospitalizations, surgeries, and ER visits in previous 12 months J.  [place] to include hearing, vision, cognitive, depression L. Referrals and appointments - none  In addition, I have reviewed and discussed with patient certain preventive protocols, quality metrics, and best practice recommendations. A written personalized care plan for preventive services as well as general  preventive health recommendations were provided to patient.  See attached scanned questionnaire for additional information.   Signed,   Rich Reining, RN Nurse Health Advisor   Quick Notes   Health Maintenance: Flu vaccine due- facility will give. Prevnar and tdap due-ordered.     Abnormal Screen: BP 166/64, asked staff to recheck/watch BP  I have personally reviewed the health advisor's clinical note, was available for consultation, and agree with the assessment and plan as written. Hendricks Limes M.D., FACP, [facility]      Patient Concerns: Coughing, spitting green for last 2 weeks.     Nurse Concerns: None

## 2017-09-29 NOTE — Patient Instructions (Signed)
[name] , Thank you for taking time to come for your Medicare Wellness Visit. I appreciate your ongoing commitment to your health goals. Please review the following plan we discussed and let me know if I can assist you in the future.   Screening recommendations/referrals: Colonoscopy excluded, pt is long term Recommended yearly ophthalmology/optometry visit for glaucoma screening and checkup Recommended yearly dental visit for hygiene and checkup  Vaccinations: Influenza vaccine due, facility will give Pneumococcal vaccine 13 due, ordered Tdap vaccine due, ordered Shingles vaccine not in records    Advanced directives: Need living will and health care power of attorney in chart  Conditions/risks identified: None  Next appointment: Dr. Linna Darner makes rounds  Preventive Care 40 Years and Older, Male Preventive care refers to lifestyle choices and visits with your health care provider that can promote health and wellness. What does preventive care include?  A yearly physical exam. This is also called an annual well check.  Dental exams once or twice a year.  Routine eye exams. Ask your health care provider how often you should have your eyes checked.  Personal lifestyle choices, including:  Daily care of your teeth and gums.  Regular physical activity.  Eating a healthy diet.  Avoiding tobacco and drug use.  Limiting alcohol use.  Practicing safe sex.  Taking low doses of aspirin every day.  Taking vitamin and mineral supplements as recommended by your health care provider. What happens during an annual well check? The services and screenings done by your health care provider during your annual well check will depend on your age, overall health, lifestyle risk factors, and family history of disease. Counseling  Your health care provider may ask you questions about your:  Alcohol use.  Tobacco use.  Drug use.  Emotional well-being.  Home and relationship  well-being.  Sexual activity.  Eating habits.  History of falls.  Memory and ability to understand (cognition).  Work and work Statistician. Screening  You may have the following tests or measurements:  Height, weight, and BMI.  Blood pressure.  Lipid and cholesterol levels. These may be checked every 5 years, or more frequently if you are over 27 years old.  Skin check.  Lung cancer screening. You may have this screening every year starting at age 31 if you have a 30-pack-year history of smoking and currently smoke or have quit within the past 15 years.  Fecal occult blood test (FOBT) of the stool. You may have this test every year starting at age 65.  Flexible sigmoidoscopy or colonoscopy. You may have a sigmoidoscopy every 5 years or a colonoscopy every 10 years starting at age 64.  Prostate cancer screening. Recommendations will vary depending on your family history and other risks.  Hepatitis C blood test.  Hepatitis B blood test.  Sexually transmitted disease (STD) testing.  Diabetes screening. This is done by checking your blood sugar (glucose) after you have not eaten for a while (fasting). You may have this done every 1-3 years.  Abdominal aortic aneurysm (AAA) screening. You may need this if you are a current or former smoker.  Osteoporosis. You may be screened starting at age 63 if you are at high risk. Talk with your health care provider about your test results, treatment options, and if necessary, the need for more tests. Vaccines  Your health care provider may recommend certain vaccines, such as:  Influenza vaccine. This is recommended every year.  Tetanus, diphtheria, and acellular pertussis (Tdap, Td) vaccine. You may  need a Td booster every 10 years.  Zoster vaccine. You may need this after age 76.  Pneumococcal 13-valent conjugate (PCV13) vaccine. One dose is recommended after age 76.  Pneumococcal polysaccharide (PPSV23) vaccine. One dose is  recommended after age 17. Talk to your health care provider about which screenings and vaccines you need and how often you need them. This information is not intended to replace advice given to you by your health care provider. Make sure you discuss any questions you have with your health care provider. Document Released: 12/14/2015 Document Revised: 08/06/2016 Document Reviewed: 09/18/2015 Elsevier Interactive Patient Education  2017 [place] Prevention in the Home Falls can cause injuries. They can happen to people of all ages. There are many things you can do to make your home safe and to help prevent falls. What can I do on the outside of my home?  Regularly fix the edges of walkways and driveways and fix any cracks.  Remove anything that might make you trip as you walk through a door, such as a raised step or threshold.  Trim any bushes or trees on the path to your home.  Use bright outdoor lighting.  Clear any walking paths of anything that might make someone trip, such as rocks or tools.  Regularly check to see if handrails are loose or broken. Make sure that both sides of any steps have handrails.  Any raised decks and porches should have guardrails on the edges.  Have any leaves, snow, or ice cleared regularly.  Use sand or salt on walking paths during winter.  Clean up any spills in your garage right away. This includes oil or grease spills. What can I do in the bathroom?  Use night lights.  Install grab bars by the toilet and in the tub and shower. Do not use towel bars as grab bars.  Use non-skid mats or decals in the tub or shower.  If you need to sit down in the shower, use a plastic, non-slip stool.  Keep the floor dry. Clean up any water that spills on the floor as soon as it happens.  Remove soap buildup in the tub or shower regularly.  Attach bath mats securely with double-sided non-slip rug tape.  Do not have throw rugs and other things on  the floor that can make you trip. What can I do in the bedroom?  Use night lights.  Make sure that you have a light by your bed that is easy to reach.  Do not use any sheets or blankets that are too big for your bed. They should not hang down onto the floor.  Have a firm chair that has side arms. You can use this for support while you get dressed.  Do not have throw rugs and other things on the floor that can make you trip. What can I do in the kitchen?  Clean up any spills right away.  Avoid walking on wet floors.  Keep items that you use a lot in easy-to-reach places.  If you need to reach something above you, use a strong step stool that has a grab bar.  Keep electrical cords out of the way.  Do not use floor polish or wax that makes floors slippery. If you must use wax, use non-skid floor wax.  Do not have throw rugs and other things on the floor that can make you trip. What can I do with my stairs?  Do not leave any items on  the stairs.  Make sure that there are handrails on both sides of the stairs and use them. Fix handrails that are broken or loose. Make sure that handrails are as long as the stairways.  Check any carpeting to make sure that it is firmly attached to the stairs. Fix any carpet that is loose or worn.  Avoid having throw rugs at the top or bottom of the stairs. If you do have throw rugs, attach them to the floor with carpet tape.  Make sure that you have a light switch at the top of the stairs and the bottom of the stairs. If you do not have them, ask someone to add them for you. What else can I do to help prevent falls?  Wear shoes that:  Do not have high heels.  Have rubber bottoms.  Are comfortable and fit you well.  Are closed at the toe. Do not wear sandals.  If you use a stepladder:  Make sure that it is fully opened. Do not climb a closed stepladder.  Make sure that both sides of the stepladder are locked into place.  Ask someone to  hold it for you, if possible.  Clearly mark and make sure that you can see:  Any grab bars or handrails.  First and last steps.  Where the edge of each step is.  Use tools that help you move around (mobility aids) if they are needed. These include:  Canes.  Walkers.  Scooters.  Crutches.  Turn on the lights when you go into a dark area. Replace any light bulbs as soon as they burn out.  Set up your furniture so you have a clear path. Avoid moving your furniture around.  If any of your floors are uneven, fix them.  If there are any pets around you, be aware of where they are.  Review your medicines with your doctor. Some medicines can make you feel dizzy. This can increase your chance of falling. Ask your doctor what other things that you can do to help prevent falls. This information is not intended to replace advice given to you by your health care provider. Make sure you discuss any questions you have with your health care provider. Document Released: 09/13/2009 Document Revised: 04/24/2016 Document Reviewed: 12/22/2014 Elsevier Interactive Patient Education  2017 [employer].

## 2017-10-05 ENCOUNTER — Emergency Department (HOSPITAL_COMMUNITY)
Admission: EM | Admit: 2017-10-05 | Discharge: 2017-10-05 | Discharge status: Against Medical Advice | Payer: Medicare Other | Attending: Emergency Medicine | Admitting: Emergency Medicine

## 2017-10-05 ENCOUNTER — Other Ambulatory Visit: Payer: Self-pay

## 2017-10-05 DIAGNOSIS — Z5320 Procedure and treatment not carried out because of patient's decision for unspecified reasons: Secondary | ICD-10-CM | POA: Insufficient documentation

## 2017-10-05 DIAGNOSIS — I5042 Chronic combined systolic (congestive) and diastolic (congestive) heart failure: Secondary | ICD-10-CM | POA: Insufficient documentation

## 2017-10-05 DIAGNOSIS — Z7902 Long term (current) use of antithrombotics/antiplatelets: Secondary | ICD-10-CM | POA: Insufficient documentation

## 2017-10-05 DIAGNOSIS — Y69 Unspecified misadventure during surgical and medical care: Secondary | ICD-10-CM | POA: Insufficient documentation

## 2017-10-05 DIAGNOSIS — I2510 Atherosclerotic heart disease of native coronary artery without angina pectoris: Secondary | ICD-10-CM | POA: Insufficient documentation

## 2017-10-05 DIAGNOSIS — N186 End stage renal disease: Secondary | ICD-10-CM | POA: Diagnosis not present

## 2017-10-05 DIAGNOSIS — Z992 Dependence on renal dialysis: Secondary | ICD-10-CM | POA: Insufficient documentation

## 2017-10-05 DIAGNOSIS — Z87891 Personal history of nicotine dependence: Secondary | ICD-10-CM | POA: Diagnosis not present

## 2017-10-05 DIAGNOSIS — I132 Hypertensive heart and chronic kidney disease with heart failure and with stage 5 chronic kidney disease, or end stage renal disease: Secondary | ICD-10-CM | POA: Diagnosis not present

## 2017-10-05 DIAGNOSIS — T83498A Other mechanical complication of other prosthetic devices, implants and grafts of genital tract, initial encounter: Secondary | ICD-10-CM | POA: Diagnosis not present

## 2017-10-05 DIAGNOSIS — Z7982 Long term (current) use of aspirin: Secondary | ICD-10-CM | POA: Diagnosis not present

## 2017-10-05 DIAGNOSIS — Z8673 Personal history of transient ischemic attack (TIA), and cerebral infarction without residual deficits: Secondary | ICD-10-CM | POA: Diagnosis not present

## 2017-10-05 DIAGNOSIS — T82898A Other specified complication of vascular prosthetic devices, implants and grafts, initial encounter: Secondary | ICD-10-CM | POA: Diagnosis not present

## 2017-10-05 DIAGNOSIS — T82838A Hemorrhage of vascular prosthetic devices, implants and grafts, initial encounter: Secondary | ICD-10-CM | POA: Insufficient documentation

## 2017-10-05 DIAGNOSIS — I120 Hypertensive chronic kidney disease with stage 5 chronic kidney disease or end stage renal disease: Secondary | ICD-10-CM | POA: Diagnosis not present

## 2017-10-05 DIAGNOSIS — Z79899 Other long term (current) drug therapy: Secondary | ICD-10-CM | POA: Insufficient documentation

## 2017-10-05 NOTE — ED Provider Notes (Signed)
[facility] EMERGENCY DEPARTMENT Provider Note   CSN: [id number] Arrival date & time: 10/05/17  1803 History   Chief Complaint Chief Complaint  Patient presents with  . Bleeding/Bruising   HPI Trevor [name] is a 71 y.o. male with PMH of ESRD on HD MWF who presented with bleeding from his dialysis graft site after dialysis. The patient denies that he is in any pain at this time. States that this is the 5th time he is here for the same thing this month. Patient states he was seen by vascular surgery recently however was told that nothing was wrong with his graft.   HPI  Past Medical History:  Diagnosis Date  . AAA (abdominal aortic aneurysm) ([place])   . Anxiety   . Arthritis   . Blindness   . CHF (congestive heart failure) ([place])   . 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 ESRD on hemodialysis ([place])    started HD 2014-15  . Headache(784.0)   . HTN (hypertension)   . Protein-calorie malnutrition [facility])    Patient Active Problem List   Diagnosis Date Noted  . Pain due to onychomycosis of toenails of both feet 07/30/2017  . 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 BPH (benign prostatic hyperplasia) 07/07/2017  . At risk for adverse drug reaction 07/02/2017  . Ischemia of extremity 06/28/2017  . Pulmonary edema 06/27/2017  . Weakness of both legs 01/25/2017  . Hypertensive urgency 01/24/2017  . 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 Ischemic chest pain 11/01/2016  . Hypoxemia 09/07/2016  . Essential hypertension 03/08/2016  . Orthostatic hypotension 01/12/2016  . Dyslipidemia 01/12/2016  . Cough   . Weakness generalized 12/26/2015  . Chest pain 08/12/2015  . Accelerated hypertension 08/12/2015  . Carotid stenosis   . Stroke ([place]) 04/03/2015  . Abdominal aortic aneurysm ([place]) 03/06/2015  . Generalized weakness 09/08/2014  . Acute encephalopathy 09/07/2014  . Protein-calorie malnutrition, severe ([place])  05/20/2014  . Hypokalemia 05/19/2014  . Syncope and collapse 05/19/2014  . ESRD on hemodialysis ([place]) 05/19/2014  . Anemia of renal disease 05/19/2014  . Coronary artery disease, non-occlusive:  09/28/2012  . Anemia 09/27/2012  . Secondary hyperparathyroidism ([place]) 09/27/2012  . Non compliance with medical treatment 09/27/2012  . Chronic combined systolic and diastolic CHF (congestive heart failure) ([place]) 10/10/2011  . A-fib ([place]) 10/08/2011  . History of stroke 10/07/2011  . Cocaine abuse ([place]) 10/07/2011  . Severe sinus bradycardia 10/07/2011  . Nonischemic cardiomyopathy ([place]) 09/18/2011  . Tobacco use disorder 08/07/2011  . Bruit 08/07/2011   Past Surgical History:  Procedure Laterality Date  . right hand      Home Medications    Prior to Admission medications   Medication Sig Start Date End Date Taking? Authorizing Provider  Amino Acids-Protein Hydrolys (FEEDING SUPPLEMENT, PRO-STAT SUGAR FREE 64,) LIQD Take 30 mLs by mouth 2 (two) times daily.    [provider]  amLODipine (NORVASC) 10 MG tablet Take 10 mg by mouth every evening. 04/13/17   [provider]  aspirin 81 MG chewable tablet Chew 1 tablet (81 mg total) by mouth daily. 11/04/16   Lyda Jester M, PA-C  atorvastatin (LIPITOR) 40 MG tablet Take 40 mg by mouth at bedtime.    [provider]  clopidogrel (PLAVIX) 75 MG tablet Take 1 tablet (75 mg total) by mouth daily. 04/05/15   Barton Dubois, MD  docusate sodium (COLACE) 100 MG capsule Take 200  mg by mouth daily.     [provider]  ferric citrate (AURYXIA) 1 GM 210 MG(Fe) tablet Take 210 mg by mouth 3 (three) times daily with meals.    [provider]  fluticasone (FLONASE) 50 MCG/ACT nasal spray Place into both nostrils daily.    [provider]  gabapentin (NEURONTIN) 100 MG capsule Take 1 capsule (100 mg total) by mouth 3 (three) times daily. 04/05/15   Barton Dubois, MD  hydrALAZINE (APRESOLINE) 25 MG  tablet Take 25 mg by mouth 3 (three) times daily.    [provider]  isosorbide mononitrate (IMDUR) 30 MG 24 hr tablet Take 30 mg by mouth daily.    [provider]  isosorbide-hydrALAZINE (BIDIL) 20-37.5 MG tablet Take 1 tablet by mouth 3 (three) times daily. 11/03/16   Lyda Jester M, PA-C  metoprolol tartrate (LOPRESSOR) 25 MG tablet Take 1 tablet (25 mg total) by mouth 2 (two) times daily. 11/03/16   Lyda Jester M, PA-C  multivitamin (RENA-VIT) TABS tablet Take 1 tablet by mouth at bedtime. 07/02/17   Velvet Bathe, MD  Nutritional Supplements (FEEDING SUPPLEMENT, NEPRO CARB STEADY,) LIQD Take 237 mLs by mouth 2 (two) times daily between meals. 12/11/16   Geradine Girt, DO  Omega-3 Fatty Acids (FISH OIL) 1000 MG CAPS Take 1,000 mg by mouth 2 (two) times daily.    [provider]   Family History Family History  Problem Relation Age of Onset  . Heart attack Mother        MI in her 31s  . Diabetes Mother   . Alcohol abuse Father   . Anesthesia problems Neg Hx   . Hypotension Neg Hx   . Malignant hyperthermia Neg Hx   . Pseudochol deficiency Neg Hx    Social History Social History   Tobacco Use  . Smoking status: Former Smoker    Packs/day: 0.50    Years: 50.00    Pack years: 25.00    Types: Cigarettes  . Smokeless tobacco: Never Used  . Tobacco comment: quit 05/2017  Substance Use Topics  . Alcohol use: No    Alcohol/week: 0.0 oz    Comment: Occasional  . Drug use: No   Allergies   Patient has no known allergies.  Review of Systems Review of Systems  Constitutional: Negative for chills and fever.  HENT: Negative for ear pain and sore throat.   Eyes: Negative for pain and visual disturbance.  Respiratory: Negative for cough and shortness of breath.   Cardiovascular: Negative for chest pain and palpitations.  Gastrointestinal: Negative for abdominal pain and vomiting.  Genitourinary: Negative for dysuria and hematuria.    Musculoskeletal: Negative for arthralgias and back pain.  Skin: Positive for wound. Negative for color change and rash.  Neurological: Negative for seizures and syncope.  All other systems reviewed and are negative.  Physical Exam Updated Vital Signs BP (!) 154/78   Pulse 65   Temp (!) 97.5 F (36.4 C) (Oral)   Resp (!) 21   Ht 5\' 10"  (1.778 m)   Wt 60.3 kg (133 lb)   SpO2 99%   BMI 19.08 kg/m   Physical Exam  Constitutional: He is oriented to person, place, and time. He appears well-developed and well-nourished.  HENT:  Head: Normocephalic and atraumatic.  Eyes: Conjunctivae are normal.  Neck: Neck supple.  Cardiovascular: Normal rate and regular rhythm.  No murmur heard. Pulmonary/Chest: Effort normal and breath sounds normal. No respiratory distress.  Abdominal: Soft. There  is no tenderness.  Musculoskeletal: Normal range of motion. He exhibits no edema.  Removed pressure dressing from left upper extremity fistula with active bleeding present  Neurological: He is alert and oriented to person, place, and time.  Skin: Skin is warm and dry.  Psychiatric: He has a normal mood and affect.  Nursing note and vitals reviewed.  ED Treatments / Results  Labs (all labs ordered are listed, but only abnormal results are displayed) Labs Reviewed  I-STAT CHEM 8, ED   EKG  EKG Interpretation None      Radiology No results found.  Procedures Procedures (including critical care time)  Medications Ordered in ED Medications - No data to display  Initial Impression / Assessment and Plan / ED Course  I have reviewed the triage vital signs and the nursing notes.  Pertinent labs & imaging results that were available during my care of the patient were reviewed by me and considered in my medical decision making (see chart for details).  The patient is an 71 year old male with past medical history of ESRD who presents with bleeding from his AV fistula site.   Upon removing  dressing patient noted to have active pulsatile bleeding at the AV graft site.  Vascular surgery consulted for their assistance in controlling bleeding.  Pressure dressing was placed on the at the site with adequate hemostasis temporarily.  Discussed the plan with the patient who was in agreement with the evaluation however without notification the patient eloped.  The patient eloped prior to the completion of his evaluation.   Final Clinical Impressions(s) / ED Diagnoses   Final diagnoses:  ESRD (end stage renal disease) ([place])  Bleeding from dialysis shunt, initial encounter [facility])   ED Discharge Orders    None       Fenton Foy, MD 10/05/17 [place], [place], MD 10/05/17 [phone]

## 2017-10-05 NOTE — ED Notes (Signed)
Pt called nurses station removed monitoring devices starting to get dressed.  Refused any further lab work or care.

## 2017-10-05 NOTE — ED Provider Notes (Signed)
Noted to have bleeding from dialysis fistula today at the completion of his hemodialysis session.  He is asymptomatic.  He was treated with a pressure dressing on the dialysis fistula.  Dialysis fistula is wrapped with pressure dressing.  Upon removal of the dressing he had active pulsatile bleeding   Trevor Dakin, MD 10/05/17 [phone]

## 2017-10-05 NOTE — ED Notes (Signed)
MD removed pressure dressing to evaluate. New dressing applied by RN

## 2017-10-05 NOTE — ED Notes (Signed)
Pt expressed a desire to go home. Explained to pt that vascular surgery was consulted and that we were looking for a solution to his problem. Pt stated he understood and was hungry. Per MD pt cannot have food at this time.

## 2017-10-05 NOTE — ED Triage Notes (Signed)
Pt was finishing dialysis and fistula area began to bleed. Pt has a hx of fistula bleeding, approx. 3 times in the last month. Fistula area stopped bleeding prior to arrival of EMS however started back up again. Dialysis center has placed a "pressure dressing" on left arm and has advised that it be removed soon.

## 2017-10-07 ENCOUNTER — Encounter: Payer: Self-pay | Admitting: Internal Medicine

## 2017-10-07 NOTE — Progress Notes (Signed)
    NURSING HOME LOCATION:  [facility] ROOM NUMBER:  [id number]  CODE STATUS:  Full Code  PCP:  Clinic, [place] [address]  This is a nursing facility follow up for specific acute issue of    of chronic medical diagnoses  [place] readmission within 30 days  Interim medical record and care since last [place] visit was updated with review of diagnostic studies and change in clinical status since last visit were documented.  HPI:  Review of systems: Dementia invalidated responses. Date given as   Constitutional: No fever,significant weight change, fatigue  Eyes: No redness, discharge, pain, vision change ENT/mouth: No nasal congestion,  purulent discharge, earache,change in hearing ,sore throat  Cardiovascular: No chest pain, palpitations,paroxysmal nocturnal dyspnea, claudication, edema  Respiratory: No cough, sputum production,hemoptysis, DOE , significant snoring,apnea  Gastrointestinal: No heartburn,dysphagia,abdominal pain, nausea / vomiting,rectal bleeding, melena,change in bowels Genitourinary: No dysuria,hematuria, pyuria,  incontinence, nocturia Musculoskeletal: No joint stiffness, joint swelling, weakness,pain Dermatologic: No rash, pruritus, change in appearance of skin Neurologic: No dizziness,headache,syncope, seizures, numbness , tingling Psychiatric: No significant anxiety , depression, insomnia, anorexia Endocrine: No change in hair/skin/ nails, excessive thirst, excessive hunger, excessive urination  Hematologic/lymphatic: No significant bruising, lymphadenopathy,abnormal bleeding Allergy/immunology: No itchy/ watery eyes, significant sneezing, urticaria, angioedema  Physical exam:  Pertinent or positive findings: General appearance:Adequately nourished; no acute distress , increased work of breathing is present.   Lymphatic: No lymphadenopathy about the head, neck, axilla . Eyes: No  conjunctival inflammation or lid edema is present. There is no scleral icterus. Ears:  External ear exam shows no significant lesions or deformities.   Nose:  External nasal examination shows no deformity or inflammation. Nasal mucosa are pink and moist without lesions ,exudates Oral exam: lips and gums are healthy appearing.There is no oropharyngeal erythema or exudate . Neck:  No thyromegaly, masses, tenderness noted.    Heart:  Normal rate and regular rhythm. S1 and S2 normal without gallop, murmur, click, rub .  Lungs:Chest clear to auscultation without wheezes, rhonchi,rales , rubs. Abdomen:Bowel sounds are normal. Abdomen is soft and nontender with no organomegaly, hernias,masses. GU: deferred  Extremities:  No cyanosis, clubbing,edema  Neurologic exam : Cn 2-7 intact Strength equal  in upper & lower extremities Balance,Rhomberg,finger to nose testing could not be completed due to clinical state Deep tendon reflexes are equal Skin: Warm & dry w/o tenting. No significant lesions or rash.  See summary under each active problem in the Problem List with associated updated therapeutic plan     This encounter was created in error - please disregard.

## 2017-10-09 DIAGNOSIS — Z23 Encounter for immunization: Secondary | ICD-10-CM | POA: Diagnosis not present

## 2017-10-20 ENCOUNTER — Non-Acute Institutional Stay (SKILLED_NURSING_FACILITY): Payer: Medicare Other | Admitting: Internal Medicine

## 2017-10-20 ENCOUNTER — Encounter: Payer: Self-pay | Admitting: Internal Medicine

## 2017-10-20 DIAGNOSIS — I739 Peripheral vascular disease, unspecified: Secondary | ICD-10-CM | POA: Diagnosis not present

## 2017-10-20 DIAGNOSIS — T829XXA Unspecified complication of cardiac and vascular prosthetic device, implant and graft, initial encounter: Secondary | ICD-10-CM | POA: Insufficient documentation

## 2017-10-20 DIAGNOSIS — J310 Chronic rhinitis: Secondary | ICD-10-CM | POA: Diagnosis not present

## 2017-10-20 DIAGNOSIS — T829XXD Unspecified complication of cardiac and vascular prosthetic device, implant and graft, subsequent encounter: Secondary | ICD-10-CM | POA: Diagnosis not present

## 2017-10-20 DIAGNOSIS — I10 Essential (primary) hypertension: Secondary | ICD-10-CM

## 2017-10-20 NOTE — Assessment & Plan Note (Signed)
Flonase trial

## 2017-10-20 NOTE — Assessment & Plan Note (Addendum)
Dr Early to repeat ABIs

## 2017-10-20 NOTE — Progress Notes (Signed)
NURSING HOME LOCATION:  [facility] ROOM NUMBER:  [id number]  CODE STATUS:  Full Code  PCP:  Clinic, [place] [address]  This is a nursing facility follow up of chronic medical diagnoses  Interim medical record and care since last [place] visit was updated with review of diagnostic studies and change in clinical status since last visit were documented.  HPI: The patient is now a permanent resident of the facility with diagnoses of essential hypertension, atrial fibrillation, peripheral vascular disease with carotid stenosis, chronic combined systolic and diastolic congestive heart failure in the context of ischemic cardiomyopathy, nonocclusive coronary artery disease, vasculopathy with extremity ischemia, past history stroke, end-stage renal disease on hemodialysis, anemia of chronic disease ,dyslipidemia, and protein-caloric malnutrition, severe. He has a history of tobacco use disorder, noncompliance with medical regimen, and cocaine abuse. He has had several ED visits, the last was 10/05/17. He was seen for active bleeding from the dialysis graft site at termination of dialysis. According to the patient this was the fifth such episode. On exam he was noted to have active pulsatile bleeding at the AV graft site following removal of the dressing. Vascular surgery was consulted, pressor dressing was placed on site with adequate hemostasis. The patient did not stay for completion of the evaluation, described as "eloping". He states he calls his sister for transport back to the [name] as the ED will not let him leave without prearranged transportation. He states he has run up ambulance bills he cannot pay and therefore must depend on family to transport him. The patient denies any other bleeding dyscrasias except the episodes of bleeding from the graft site. He states that this is related to technique at dialysis. He feels  certain  technicians do not apply pressure adequately or long enough after the needle was pulled resulting in bleeding. He states the pressure applied for at least 15 minutes or even up to 30 minutes is necessary to prevent bleeding. Most recent vascular evaluation was 07/21/17 by Dr. Curt Jews. He had undergone right axillofemoral femoral-femoral bypass with an 8 mm graft for critical limb ischemia bilaterally, right greater than left on 7/29. Axillofemoral and femoral-femoral graft pulses were easily palpable. Peripheral nerve injury was thought to be related to prolonged severe ischemia. ABIs were recommended in November of this year.  The most recent labs in [place] were 09/14/17. Creatinine was 4.50, BUN 25, hemoglobin 11.9, and hematocrit 35.  Presently the patient is on 75 mg of Plavix and 81 mg of aspirin.  Review of systems:  He describes rhinitis, particularly after meals. He also has intermittent sore throat related to postnasal drainage. He denies any infectious upper or lower respiratory tract symptoms. He describes weakness in the right lower extremity greater than the left but the weakness is improving. The patient reports hypotension with systolic blood pressures into the 80s following dialysis. He states typically they will remove 2 L. Predialysis his systolic blood pressure has been typically in the 160s.  Constitutional: No fever,significant weight change  Eyes: No redness, discharge, pain, vision change ENT/mouth: No nasal congestion,  purulent discharge, earache,change in hearing ,sore throat  Cardiovascular: No chest pain, palpitations,paroxysmal nocturnal dyspnea, claudication, edema  Respiratory: No cough, sputum production,hemoptysis, DOE , significant snoring,apnea  Gastrointestinal: No heartburn,dysphagia,abdominal pain, nausea / vomiting,rectal bleeding, melena,change in bowels Genitourinary: No dysuria,hematuria, pyuria,  incontinence, nocturia Dermatologic: No rash, pruritus,  change in appearance of skin Neurologic: No dizziness,headache,syncope, seizures, numbness , tingling Psychiatric:  No significant anxiety , depression, insomnia, anorexia Endocrine: No change in hair/skin/ nails, excessive thirst, excessive hunger, excessive urination  Hematologic/lymphatic: No significant bruising, lymphadenopathy,abnormal bleeding Allergy/immunology: No itchy/ watery eyes, significant sneezing, urticaria, angioedema  Physical exam:  Pertinent or positive findings: The patient is actually very articulate ( 2 years of college). He has complete dentures. The right nasolabial fold is  slightly decreased with drooping of the right corner of the mouth. 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 Heart rhythm is slightly irregular with occasional extra beat. Breath sounds are markedly decreased posteriorly. A long graft is palpable in the right anterior thorax/abdomen. Pedal pulses are decreased. He was generally weak, especially the right lower extremity.  General appearance:no acute distress , increased work of breathing is present.   Lymphatic: No lymphadenopathy about the head, neck, axilla . Eyes: No conjunctival inflammation or lid edema is present. There is no scleral icterus. Ears:  External ear exam shows no significant lesions or deformities.   Nose:  External nasal examination shows no deformity or inflammation. Nasal mucosa are pink and moist without lesions ,exudates Oral exam: lips and gums are healthy appearing.There is no oropharyngeal erythema or exudate . Neck:  No thyromegaly, masses, tenderness noted.    Heart:  No gallop, murmur, click, rub .  Lungs:without wheezes, rhonchi,rales , rubs. Abdomen:Bowel sounds are normal. Abdomen is soft and nontender with no organomegaly, hernias. GU: deferred  Extremities:  No cyanosis, clubbing,edema  Neurologic exam : Skin: Warm & dry w/o tenting. No significant lesions or rash.  See summary under each active problem in the  Problem List with associated updated therapeutic plan

## 2017-10-20 NOTE — Assessment & Plan Note (Addendum)
Nephrology will be contacted concerning his concerns about being related to post  dialysis needle removal technique Continue Plavix Seek Dr Luther Parody opinion on discontinuation of low-dose aspirin based on literature review in Up to Date which states low-dose aspirin has no additional benefit beyond Plavix but is associated with increased bleeding risk. It may be necessary because of grafts

## 2017-10-20 NOTE — Assessment & Plan Note (Addendum)
Verify blood pressure average for nondialysis days to determine optimal regimen

## 2017-10-20 NOTE — Patient Instructions (Signed)
See assessment and plan under each diagnosis in the problem list and acutely for this visit 

## 2017-10-21 ENCOUNTER — Encounter: Payer: Self-pay | Admitting: Internal Medicine

## 2017-10-27 ENCOUNTER — Encounter: Payer: Self-pay | Admitting: Vascular Surgery

## 2017-10-27 ENCOUNTER — Ambulatory Visit (HOSPITAL_COMMUNITY)
Admission: RE | Admit: 2017-10-27 | Discharge: 2017-10-27 | Disposition: A | Discharge status: Home / Self Care | Payer: Medicare Other | Source: Ambulatory Visit | Attending: Vascular Surgery | Admitting: Vascular Surgery

## 2017-10-27 ENCOUNTER — Ambulatory Visit (INDEPENDENT_AMBULATORY_CARE_PROVIDER_SITE_OTHER): Payer: Medicare Other | Admitting: Vascular Surgery

## 2017-10-27 VITALS — BP 188/77 | HR 61 | Temp 97.0°F | Resp 16 | Ht 70.0 in | Wt 133.0 lb

## 2017-10-27 DIAGNOSIS — I739 Peripheral vascular disease, unspecified: Secondary | ICD-10-CM | POA: Insufficient documentation

## 2017-10-27 NOTE — Progress Notes (Signed)
Vascular and Vein Specialist of [facility]  Patient name: Trevor [name] MRN: [id number] DOB: Apr 11, 1946 Sex: male  REASON FOR VISIT: Follow-up right axillofemoral and femoral to femoral bypass in his emergency on 06/28/2017  HPI: Trevor [name] is a 71 y.o. male here for follow-up.  He looks good today.  He is in a skilled nursing facility.  He reports that he is able to walk around his room and do his usual daily activities.  Does report he does not do a great deal of walking.  Denies any ischemic rest pain and has had no ulcerations or tissue loss.  Past Medical History:  Diagnosis Date  . AAA (abdominal aortic aneurysm) ([place])   . Anxiety   . Arthritis   . Blindness   . CHF (congestive heart failure) ([place])   . 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 ESRD on hemodialysis ([place])    started HD 2014-15  . Headache(784.0)   . HTN (hypertension)   . Protein-calorie malnutrition ([place])     Family History  Problem Relation Age of Onset  . Heart attack Mother        MI in her 72s  . Diabetes Mother   . Alcohol abuse Father   . Anesthesia problems Neg Hx   . Hypotension Neg Hx   . Malignant hyperthermia Neg Hx   . Pseudochol deficiency Neg Hx     SOCIAL HISTORY: Social History   Tobacco Use  . Smoking status: Former Smoker    Packs/day: 0.50    Years: 50.00    Pack years: 25.00    Types: Cigarettes  . Smokeless tobacco: Never Used  . Tobacco comment: quit 05/2017  Substance Use Topics  . Alcohol use: No    Alcohol/week: 0.0 oz    Comment: Occasional    No Known Allergies  Current Outpatient Medications  Medication Sig Dispense Refill  . Amino Acids-Protein Hydrolys (FEEDING SUPPLEMENT, PRO-STAT SUGAR FREE 64,) LIQD Take 30 mLs by mouth 2 (two) times daily.    Marland Kitchen amLODipine (NORVASC) 10 MG tablet Take 10 mg by mouth every evening.  0  . aspirin 81 MG chewable tablet Chew 1 tablet (81  mg total) by mouth daily.    Marland Kitchen atorvastatin (LIPITOR) 40 MG tablet Take 40 mg by mouth at bedtime.    . clopidogrel (PLAVIX) 75 MG tablet Take 1 tablet (75 mg total) by mouth daily. 30 tablet 2  . docusate sodium (COLACE) 100 MG capsule Take 200 mg by mouth daily.     . ferric citrate (AURYXIA) 1 GM 210 MG(Fe) tablet Take 210 mg by mouth 3 (three) times daily with meals.    . fluticasone (FLONASE) 50 MCG/ACT nasal spray Place into both nostrils daily.    Marland Kitchen gabapentin (NEURONTIN) 100 MG capsule Take 1 capsule (100 mg total) by mouth 3 (three) times daily. 90 capsule 2  . hydrALAZINE (APRESOLINE) 25 MG tablet Take 25 mg by mouth 3 (three) times daily.    . isosorbide mononitrate (IMDUR) 30 MG 24 hr tablet Take 30 mg by mouth daily.    . metoprolol tartrate (LOPRESSOR) 25 MG tablet Take 1 tablet (25 mg total) by mouth 2 (two) times daily. 60 tablet 5  . multivitamin (RENA-VIT) TABS tablet Take 1 tablet by mouth at bedtime. 30 each 0  . Nutritional Supplements (FEEDING SUPPLEMENT, NEPRO CARB STEADY,) LIQD Take 237 mLs by mouth 2 (two)  times daily between meals.  0  . Omega-3 Fatty Acids (FISH OIL) 1000 MG CAPS Take 1,000 mg by mouth 2 (two) times daily.     No current facility-administered medications for this visit.     REVIEW OF SYSTEMS:  [X]  denotes positive finding, [ ]  denotes negative finding Cardiac  Comments:  Chest pain or chest pressure:    Shortness of breath upon exertion:    Short of breath when lying flat:    Irregular heart rhythm:        Vascular    Pain in calf, thigh, or hip brought on by ambulation:    Pain in feet at night that wakes you up from your sleep:     Blood clot in your veins:    Leg swelling:           PHYSICAL EXAM: Vitals:   10/27/17 1123  BP: (!) 188/77  Pulse: 61  Resp: 16  Temp: (!) 97 F (36.1 C)  TempSrc: Oral  SpO2: 97%  Weight: 133 lb (60.3 kg)  Height: 5\' 10"  (1.778 m)    GENERAL: The patient is a well-nourished male, in no acute  distress. The vital signs are documented above. CARDIOVASCULAR: Easily palpable axillofemoral and femorofemoral bypass graft pulses.  No pedal pulses.  Feet well perfused. PULMONARY: There is good air exchange  MUSCULOSKELETAL: There are no major deformities or cyanosis. NEUROLOGIC: No focal weakness or paresthesias are detected. SKIN: There are no ulcers or rashes noted. PSYCHIATRIC: The patient has a normal affect.  DATA:  Arm index of 0.4 bilaterally with monophasic waveforms  MEDICAL ISSUES: Stable status post axillofemoral femorofemoral bypass.  Known infrainguinal disease.  Asymptomatic from this.  We will continue his usual activities.  We will see him in as-needed basis    Rosetta Posner, MD [facility] Vascular and Vein Specialists of [facility] Tel [phone] Pager [phone]

## 2017-11-01 ENCOUNTER — Inpatient Hospital Stay (HOSPITAL_COMMUNITY)
Admission: EM | Admit: 2017-11-01 | Discharge: 2017-11-03 | DRG: 189 | Disposition: A | Discharge status: Skilled Nursing Facility | Payer: Medicare Other | Attending: Internal Medicine | Admitting: Internal Medicine

## 2017-11-01 ENCOUNTER — Encounter (HOSPITAL_COMMUNITY): Payer: Self-pay | Admitting: Emergency Medicine

## 2017-11-01 ENCOUNTER — Other Ambulatory Visit: Payer: Self-pay

## 2017-11-01 ENCOUNTER — Emergency Department (HOSPITAL_COMMUNITY): Payer: Medicare Other

## 2017-11-01 DIAGNOSIS — I2510 Atherosclerotic heart disease of native coronary artery without angina pectoris: Secondary | ICD-10-CM | POA: Diagnosis present

## 2017-11-01 DIAGNOSIS — Z9115 Patient's noncompliance with renal dialysis: Secondary | ICD-10-CM | POA: Diagnosis not present

## 2017-11-01 DIAGNOSIS — I120 Hypertensive chronic kidney disease with stage 5 chronic kidney disease or end stage renal disease: Secondary | ICD-10-CM | POA: Diagnosis not present

## 2017-11-01 DIAGNOSIS — Z7982 Long term (current) use of aspirin: Secondary | ICD-10-CM

## 2017-11-01 DIAGNOSIS — G629 Polyneuropathy, unspecified: Secondary | ICD-10-CM | POA: Diagnosis present

## 2017-11-01 DIAGNOSIS — I428 Other cardiomyopathies: Secondary | ICD-10-CM

## 2017-11-01 DIAGNOSIS — I429 Cardiomyopathy, unspecified: Secondary | ICD-10-CM | POA: Diagnosis present

## 2017-11-01 DIAGNOSIS — Z833 Family history of diabetes mellitus: Secondary | ICD-10-CM

## 2017-11-01 DIAGNOSIS — I739 Peripheral vascular disease, unspecified: Secondary | ICD-10-CM | POA: Diagnosis not present

## 2017-11-01 DIAGNOSIS — F419 Anxiety disorder, unspecified: Secondary | ICD-10-CM | POA: Diagnosis present

## 2017-11-01 DIAGNOSIS — Z992 Dependence on renal dialysis: Secondary | ICD-10-CM

## 2017-11-01 DIAGNOSIS — R0602 Shortness of breath: Secondary | ICD-10-CM

## 2017-11-01 DIAGNOSIS — I5042 Chronic combined systolic (congestive) and diastolic (congestive) heart failure: Secondary | ICD-10-CM | POA: Diagnosis present

## 2017-11-01 DIAGNOSIS — H547 Unspecified visual loss: Secondary | ICD-10-CM

## 2017-11-01 DIAGNOSIS — Z811 Family history of alcohol abuse and dependence: Secondary | ICD-10-CM | POA: Diagnosis not present

## 2017-11-01 DIAGNOSIS — Z9119 Patient's noncompliance with other medical treatment and regimen: Secondary | ICD-10-CM

## 2017-11-01 DIAGNOSIS — I132 Hypertensive heart and chronic kidney disease with heart failure and with stage 5 chronic kidney disease, or end stage renal disease: Secondary | ICD-10-CM | POA: Diagnosis present

## 2017-11-01 DIAGNOSIS — J9601 Acute respiratory failure with hypoxia: Secondary | ICD-10-CM | POA: Diagnosis present

## 2017-11-01 DIAGNOSIS — Z7902 Long term (current) use of antithrombotics/antiplatelets: Secondary | ICD-10-CM | POA: Diagnosis not present

## 2017-11-01 DIAGNOSIS — Z87891 Personal history of nicotine dependence: Secondary | ICD-10-CM

## 2017-11-01 DIAGNOSIS — Z8673 Personal history of transient ischemic attack (TIA), and cerebral infarction without residual deficits: Secondary | ICD-10-CM

## 2017-11-01 DIAGNOSIS — E785 Hyperlipidemia, unspecified: Secondary | ICD-10-CM | POA: Diagnosis present

## 2017-11-01 DIAGNOSIS — E8770 Fluid overload, unspecified: Secondary | ICD-10-CM | POA: Diagnosis not present

## 2017-11-01 DIAGNOSIS — I10 Essential (primary) hypertension: Secondary | ICD-10-CM | POA: Diagnosis not present

## 2017-11-01 DIAGNOSIS — N186 End stage renal disease: Secondary | ICD-10-CM | POA: Diagnosis present

## 2017-11-01 DIAGNOSIS — Z79899 Other long term (current) drug therapy: Secondary | ICD-10-CM | POA: Diagnosis not present

## 2017-11-01 DIAGNOSIS — D631 Anemia in chronic kidney disease: Secondary | ICD-10-CM | POA: Diagnosis present

## 2017-11-01 DIAGNOSIS — J810 Acute pulmonary edema: Secondary | ICD-10-CM | POA: Diagnosis present

## 2017-11-01 DIAGNOSIS — Z8249 Family history of ischemic heart disease and other diseases of the circulatory system: Secondary | ICD-10-CM | POA: Diagnosis not present

## 2017-11-01 DIAGNOSIS — Z91199 Patient's noncompliance with other medical treatment and regimen due to unspecified reason: Secondary | ICD-10-CM

## 2017-11-01 LAB — MRSA PCR SCREENING: MRSA BY PCR: NEGATIVE

## 2017-11-01 LAB — COMPREHENSIVE METABOLIC PANEL
ALBUMIN: 3.2 g/dL — AB (ref 3.5–5.0)
ALK PHOS: 106 U/L (ref 38–126)
ALT: 13 U/L — AB (ref 17–63)
ANION GAP: 16 — AB (ref 5–15)
AST: 18 U/L (ref 15–41)
BILIRUBIN TOTAL: 1.3 mg/dL — AB (ref 0.3–1.2)
BUN: 81 mg/dL — AB (ref 6–20)
CALCIUM: 9.5 mg/dL (ref 8.9–10.3)
CO2: 21 mmol/L — AB (ref 22–32)
CREATININE: 11.57 mg/dL — AB (ref 0.61–1.24)
Chloride: 99 mmol/L — ABNORMAL LOW (ref 101–111)
GFR calc Af Amer: 4 mL/min — ABNORMAL LOW (ref >60)
GFR calc non Af Amer: 4 mL/min — ABNORMAL LOW (ref >60)
GLUCOSE: 123 mg/dL — AB (ref 65–99)
Potassium: 5 mmol/L (ref 3.5–5.1)
Sodium: 136 mmol/L (ref 135–145)
Total Protein: 7.5 g/dL (ref 6.5–8.1)

## 2017-11-01 LAB — CBC WITH DIFFERENTIAL/PLATELET
BASOS PCT: 1 %
Basophils Absolute: 0.1 10*3/uL (ref 0.0–0.1)
Eosinophils Absolute: 0.1 10*3/uL (ref 0.0–0.7)
Eosinophils Relative: 1 %
HEMATOCRIT: 34.7 % — AB (ref 39.0–52.0)
HEMOGLOBIN: 11.8 g/dL — AB (ref 13.0–17.0)
Lymphocytes Relative: 8 %
Lymphs Abs: 0.9 10*3/uL (ref 0.7–4.0)
MCH: 29.4 pg (ref 26.0–34.0)
MCHC: 34 g/dL (ref 30.0–36.0)
MCV: 86.5 fL (ref 78.0–100.0)
MONOS PCT: 7 %
Monocytes Absolute: 0.7 10*3/uL (ref 0.1–1.0)
NEUTROS ABS: 8.9 10*3/uL — AB (ref 1.7–7.7)
NEUTROS PCT: 83 %
Platelets: 194 10*3/uL (ref 150–400)
RBC: 4.01 MIL/uL — AB (ref 4.22–5.81)
RDW: 13.8 % (ref 11.5–15.5)
WBC: 10.7 10*3/uL — AB (ref 4.0–10.5)

## 2017-11-01 MED ORDER — METOPROLOL TARTRATE 25 MG PO TABS
25.0000 mg | ORAL_TABLET | Freq: Two times a day (BID) | ORAL | Status: DC
Start: 2017-11-01 — End: 2017-11-03
  Administered 2017-11-01 – 2017-11-03 (×3): 25 mg via ORAL
  Filled 2017-11-01 (×4): qty 1

## 2017-11-01 MED ORDER — CLOPIDOGREL BISULFATE 75 MG PO TABS
75.0000 mg | ORAL_TABLET | Freq: Every day | ORAL | Status: DC
  Administered 2017-11-02 – 2017-11-03 (×2): 75 mg via ORAL
  Filled 2017-11-01 (×2): qty 1

## 2017-11-01 MED ORDER — PENTAFLUOROPROP-TETRAFLUOROETH EX AERO
1.0000 "application " | INHALATION_SPRAY | CUTANEOUS | Status: DC | PRN
  Filled 2017-11-01: qty 30

## 2017-11-01 MED ORDER — HEPARIN SODIUM (PORCINE) 1000 UNIT/ML DIALYSIS
4000.0000 [IU] | Freq: Once | INTRAMUSCULAR | Status: DC
  Filled 2017-11-01: qty 4

## 2017-11-01 MED ORDER — ASPIRIN 81 MG PO CHEW
81.0000 mg | CHEWABLE_TABLET | Freq: Every day | ORAL | Status: DC
  Administered 2017-11-02 – 2017-11-03 (×2): 81 mg via ORAL
  Filled 2017-11-01 (×2): qty 1

## 2017-11-01 MED ORDER — FLUTICASONE PROPIONATE 50 MCG/ACT NA SUSP
2.0000 | Freq: Two times a day (BID) | NASAL | Status: DC
  Administered 2017-11-02 – 2017-11-03 (×3): 2 via NASAL
  Filled 2017-11-01: qty 16

## 2017-11-01 MED ORDER — ALTEPLASE 2 MG IJ SOLR: 2.0000 mg | Freq: Once | INTRAMUSCULAR | Status: DC | PRN

## 2017-11-01 MED ORDER — ONDANSETRON HCL 4 MG/2ML IJ SOLN: 4.0000 mg | Freq: Four times a day (QID) | INTRAMUSCULAR | Status: DC | PRN

## 2017-11-01 MED ORDER — SODIUM CHLORIDE 0.9 % IV SOLN: 100.0000 mL | INTRAVENOUS | Status: DC | PRN

## 2017-11-01 MED ORDER — ISOSORBIDE MONONITRATE ER 30 MG PO TB24
30.0000 mg | ORAL_TABLET | Freq: Every day | ORAL | Status: DC
  Administered 2017-11-02 – 2017-11-03 (×2): 30 mg via ORAL
  Filled 2017-11-01 (×2): qty 1

## 2017-11-01 MED ORDER — DOCUSATE SODIUM 100 MG PO CAPS
200.0000 mg | ORAL_CAPSULE | Freq: Every day | ORAL | Status: DC
  Administered 2017-11-02 – 2017-11-03 (×2): 200 mg via ORAL
  Filled 2017-11-01 (×2): qty 2

## 2017-11-01 MED ORDER — ACETAMINOPHEN 650 MG RE SUPP: 650.0000 mg | Freq: Four times a day (QID) | RECTAL | Status: DC | PRN

## 2017-11-01 MED ORDER — RENA-VITE PO TABS
1.0000 | ORAL_TABLET | Freq: Every day | ORAL | Status: DC
  Administered 2017-11-01 – 2017-11-02 (×2): 1 via ORAL
  Filled 2017-11-01 (×2): qty 1

## 2017-11-01 MED ORDER — HEPARIN SODIUM (PORCINE) 1000 UNIT/ML DIALYSIS
1000.0000 [IU] | INTRAMUSCULAR | Status: DC | PRN
  Filled 2017-11-01: qty 1

## 2017-11-01 MED ORDER — LIDOCAINE HCL (PF) 1 % IJ SOLN: 5.0000 mL | INTRAMUSCULAR | Status: DC | PRN

## 2017-11-01 MED ORDER — LIDOCAINE-PRILOCAINE 2.5-2.5 % EX CREA
1.0000 "application " | TOPICAL_CREAM | CUTANEOUS | Status: DC | PRN
  Filled 2017-11-01: qty 5

## 2017-11-01 MED ORDER — ENOXAPARIN SODIUM 30 MG/0.3ML ~~LOC~~ SOLN: 30.0000 mg | SUBCUTANEOUS | Status: DC

## 2017-11-01 MED ORDER — AMLODIPINE BESYLATE 10 MG PO TABS
10.0000 mg | ORAL_TABLET | Freq: Every evening | ORAL | Status: DC
  Administered 2017-11-01: 10 mg via ORAL
  Filled 2017-11-01 (×2): qty 1

## 2017-11-01 MED ORDER — OMEGA-3-ACID ETHYL ESTERS 1 G PO CAPS
1.0000 g | ORAL_CAPSULE | Freq: Every day | ORAL | Status: DC
Start: 2017-11-01 — End: 2017-11-03
  Administered 2017-11-02 – 2017-11-03 (×2): 1 g via ORAL
  Filled 2017-11-01 (×2): qty 1

## 2017-11-01 MED ORDER — ATORVASTATIN CALCIUM 40 MG PO TABS
40.0000 mg | ORAL_TABLET | Freq: Every day | ORAL | Status: DC
  Administered 2017-11-01 – 2017-11-02 (×2): 40 mg via ORAL
  Filled 2017-11-01 (×2): qty 1

## 2017-11-01 MED ORDER — ACETAMINOPHEN 325 MG PO TABS
650.0000 mg | ORAL_TABLET | Freq: Four times a day (QID) | ORAL | Status: DC | PRN
Start: 2017-11-01 — End: 2017-11-03

## 2017-11-01 MED ORDER — SODIUM CHLORIDE 0.9% FLUSH
3.0000 mL | Freq: Two times a day (BID) | INTRAVENOUS | Status: DC
  Administered 2017-11-01 – 2017-11-03 (×4): 3 mL via INTRAVENOUS

## 2017-11-01 MED ORDER — GABAPENTIN 100 MG PO CAPS
100.0000 mg | ORAL_CAPSULE | Freq: Three times a day (TID) | ORAL | Status: DC
  Administered 2017-11-01 – 2017-11-03 (×5): 100 mg via ORAL
  Filled 2017-11-01 (×5): qty 1

## 2017-11-01 MED ORDER — SODIUM CHLORIDE 0.9% FLUSH: 3.0000 mL | INTRAVENOUS | Status: DC | PRN

## 2017-11-01 MED ORDER — PRO-STAT SUGAR FREE PO LIQD
30.0000 mL | Freq: Two times a day (BID) | ORAL | Status: DC
  Administered 2017-11-01 – 2017-11-03 (×4): 30 mL via ORAL
  Filled 2017-11-01 (×5): qty 30

## 2017-11-01 MED ORDER — OXYCODONE HCL 5 MG PO TABS: 5.0000 mg | ORAL_TABLET | ORAL | Status: DC | PRN

## 2017-11-01 MED ORDER — ONDANSETRON HCL 4 MG PO TABS: 4.0000 mg | ORAL_TABLET | Freq: Four times a day (QID) | ORAL | Status: DC | PRN

## 2017-11-01 MED ORDER — FERRIC CITRATE 1 GM 210 MG(FE) PO TABS
210.0000 mg | ORAL_TABLET | Freq: Three times a day (TID) | ORAL | Status: DC
  Administered 2017-11-02 – 2017-11-03 (×5): 210 mg via ORAL
  Filled 2017-11-01 (×6): qty 1

## 2017-11-01 MED ORDER — SODIUM CHLORIDE 0.9 % IV SOLN: 250.0000 mL | INTRAVENOUS | Status: DC | PRN

## 2017-11-01 MED ORDER — NEPRO/CARBSTEADY PO LIQD
237.0000 mL | Freq: Two times a day (BID) | ORAL | Status: DC
  Administered 2017-11-02 – 2017-11-03 (×4): 237 mL via ORAL
  Filled 2017-11-01 (×6): qty 237

## 2017-11-01 MED ORDER — HYDRALAZINE HCL 25 MG PO TABS
25.0000 mg | ORAL_TABLET | Freq: Three times a day (TID) | ORAL | Status: DC
  Administered 2017-11-01 – 2017-11-03 (×3): 25 mg via ORAL
  Filled 2017-11-01 (×4): qty 1

## 2017-11-01 NOTE — ED Notes (Signed)
The pt is a mouth breather, he was on 4L via nasal cannula. satting 86%. Bumped up too 6L still 86%/ now on 8L at 89%. instructed to breathe through his nose.

## 2017-11-01 NOTE — ED Provider Notes (Signed)
[facility] EMERGENCY DEPARTMENT Provider Note   CSN: [id number] Arrival date & time: 11/01/17  1122     History   Chief Complaint Chief Complaint  Patient presents with  . Shortness of Breath   5 caveat secondary to urgent need mention and patient's inability to communicate due to severe illness HPI Trevor [name] is a 71 y.o. male.  HPI 71 year old man with end-stage renal disease on dialysis Monday Wednesday Friday, missed dialysis on Friday, resident of nursing home with respiratory distress.  EMS reports being called to nursing home due to respiratory distress.  They report that he had oxygen saturations at 50% on the air arrival.  He was placed on CPAP and sats have been increased to 80-90%.  They state that he was trying to talk to them and route.  He is unable to communicate with me currently due to the CPAP in place. Past Medical History:  Diagnosis Date  . AAA (abdominal aortic aneurysm) ([place])   . Anxiety   . Arthritis   . Blindness   . CHF (congestive heart failure) ([place])   . 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 ESRD on hemodialysis ([place])    started HD 2014-15  . Headache(784.0)   . HTN (hypertension)   . Protein-calorie malnutrition [facility])     Patient Active Problem List   Diagnosis Date Noted  . Vascular device, implant, or graft complication 86/57/8469  . Peripheral vascular disease ([place]) 10/20/2017  . Non-allergic rhinitis 10/20/2017  . Pain due to onychomycosis of toenails of both feet 07/30/2017  . 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 BPH (benign prostatic hyperplasia) 07/07/2017  . At risk for adverse drug reaction 07/02/2017  . Ischemia of extremity 06/28/2017  . Pulmonary edema 06/27/2017  . Weakness of both legs 01/25/2017  . Hypertensive urgency 01/24/2017  . 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 Ischemic chest pain 11/01/2016  . Hypoxemia 09/07/2016  . Essential  hypertension 03/08/2016  . Orthostatic hypotension 01/12/2016  . Dyslipidemia 01/12/2016  . Cough   . Weakness generalized 12/26/2015  . Chest pain 08/12/2015  . Accelerated hypertension 08/12/2015  . Carotid stenosis   . Stroke ([place]) 04/03/2015  . Abdominal aortic aneurysm ([place]) 03/06/2015  . Generalized weakness 09/08/2014  . Acute encephalopathy 09/07/2014  . Protein-calorie malnutrition, severe ([place]) 05/20/2014  . Hypokalemia 05/19/2014  . Syncope and collapse 05/19/2014  . ESRD on hemodialysis ([place]) 05/19/2014  . Anemia of renal disease 05/19/2014  . Coronary artery disease, non-occlusive:  09/28/2012  . Anemia 09/27/2012  . Secondary hyperparathyroidism ([place]) 09/27/2012  . Non compliance with medical treatment 09/27/2012  . Chronic combined systolic and diastolic CHF (congestive heart failure) ([place]) 10/10/2011  . A-fib ([place]) 10/08/2011  . History of stroke 10/07/2011  . Cocaine abuse ([place]) 10/07/2011  . Severe sinus bradycardia 10/07/2011  . Nonischemic cardiomyopathy ([place]) 09/18/2011  . Tobacco use disorder 08/07/2011  . Bruit 08/07/2011    Past Surgical History:  Procedure Laterality Date  . AXILLARY-FEMORAL BYPASS GRAFT  06/28/2017   Procedure: BYPASS GRAFT RIGHT AXILLA-BIFEMORAL USING 8MM X 30CM AND 8MM X 60CM HEMASHIELD GOLD GRAFTS;  Surgeon: Rosetta Posner, MD;  Location: [facility];  Service: Vascular;;  . CARDIAC CATHETERIZATION N/A 11/01/2016   Procedure: Left Heart Cath and Coronary Angiography;  Surgeon: Lorretta Harp, MD;  Location: [facility] CV LAB;  Service: Cardiovascular;  Laterality: N/A;  . ENDARTERECTOMY FEMORAL Bilateral 06/28/2017  Procedure: ENDARTERECTOMY BILATERAL FEMORAL ARTERIES;  Surgeon: Rosetta Posner, MD;  Location: [facility] OR;  Service: Vascular;  Laterality: Bilateral;  . INSERTION OF DIALYSIS CATHETER  09/21/2012   Procedure: INSERTION OF DIALYSIS CATHETER;  Surgeon: Angelia Mould, MD;  Location: [facility];  Service: Vascular;  Laterality:  N/A;  Right Internal Jugular Placement  . LEFT HEART CATHETERIZATION WITH CORONARY ANGIOGRAM N/A 09/28/2012   Procedure: LEFT HEART CATHETERIZATION WITH CORONARY ANGIOGRAM;  Surgeon: Sherren Mocha, MD;  Location[facility] C[facility] LAB;  Service: Cardiovascular;  Laterality: N/A;  . LEFT HEART CATHETERIZATION WITH CORONARY ANGIOGRAM N/A 07/07/2014   Procedure: LEFT HEART CATHETERIZATION WITH CORONARY ANGIOGRAM;  Surgeon: Leonie Man, MD;  Location[facility] C[facility] LAB;  Service: Cardiovascular;  Laterality: N/A;  . right hand    . TEE WITHOUT CARDIOVERSION  10/09/2011   Procedure: TRANSESOPHAGEAL ECHOCARDIOGRAM (TEE);  Surgeon: Lelon Perla, MD;  Location[facility] ENDOSCOPY;  Service: Cardiovascular;  Laterality: N/A;       Home Medications    Prior to Admission medications   Medication Sig Start Date End Date Taking? Authorizing Provider  Amino Acids-Protein Hydrolys (FEEDING SUPPLEMENT, PRO-STAT SUGAR FREE 64,) LIQD Take 30 mLs by mouth 2 (two) times daily.    [provider]  amLODipine (NORVASC) 10 MG tablet Take 10 mg by mouth every evening. 04/13/17   [provider]  aspirin 81 MG chewable tablet Chew 1 tablet (81 mg total) by mouth daily. 11/04/16   Lyda Jester M, PA-C  atorvastatin (LIPITOR) 40 MG tablet Take 40 mg by mouth at bedtime.    [provider]  clopidogrel (PLAVIX) 75 MG tablet Take 1 tablet (75 mg total) by mouth daily. 04/05/15   Barton Dubois, MD  docusate sodium (COLACE) 100 MG capsule Take 200 mg by mouth daily.     [provider]  ferric citrate (AURYXIA) 1 GM 210 MG(Fe) tablet Take 210 mg by mouth 3 (three) times daily with meals.    [provider]  fluticasone (FLONASE) 50 MCG/ACT nasal spray Place into both nostrils daily.    [provider]  gabapentin (NEURONTIN) 100 MG capsule Take 1 capsule (100 mg total) by mouth 3 (three) times daily. 04/05/15   Barton Dubois, MD  hydrALAZINE (APRESOLINE) 25 MG tablet Take 25 mg by  mouth 3 (three) times daily.    [provider]  isosorbide mononitrate (IMDUR) 30 MG 24 hr tablet Take 30 mg by mouth daily.    [provider]  metoprolol tartrate (LOPRESSOR) 25 MG tablet Take 1 tablet (25 mg total) by mouth 2 (two) times daily. 11/03/16   Lyda Jester M, PA-C  multivitamin (RENA-VIT) TABS tablet Take 1 tablet by mouth at bedtime. 07/02/17   Velvet Bathe, MD  Nutritional Supplements (FEEDING SUPPLEMENT, NEPRO CARB STEADY,) LIQD Take 237 mLs by mouth 2 (two) times daily between meals. 12/11/16   Geradine Girt, DO  Omega-3 Fatty Acids (FISH OIL) 1000 MG CAPS Take 1,000 mg by mouth 2 (two) times daily.    [provider]    Family History Family History  Problem Relation Age of Onset  . Heart attack Mother        MI in her 77s  . Diabetes Mother   . Alcohol abuse Father   . Anesthesia problems Neg Hx   . Hypotension Neg Hx   . Malignant hyperthermia Neg Hx   . Pseudochol deficiency Neg Hx     Social History Social History   Tobacco  Use  . Smoking status: Former Smoker    Packs/day: 0.50    Years: 50.00    Pack years: 25.00    Types: Cigarettes  . Smokeless tobacco: Never Used  . Tobacco comment: quit 05/2017  Substance Use Topics  . Alcohol use: No    Alcohol/week: 0.0 oz    Comment: Occasional  . Drug use: No     Allergies   Patient has no known allergies.   Review of Systems Review of Systems  Unable to perform ROS: Acuity of condition     Physical Exam Updated Vital Signs SpO2 99%   Physical Exam  Constitutional: He appears well-developed and well-nourished. He appears ill.  HENT:  Head: Normocephalic and atraumatic.  CPAP in place  Neck: JVD present.  Cardiovascular: Normal rate.  Pulmonary/Chest: Accessory muscle usage present. Tachypnea noted. He has rhonchi.  Abdominal: Soft.  Musculoskeletal:       Right lower leg: Normal.       Left lower leg: Normal.  Neurological: He is alert.  Skin: Skin is  warm and dry. Capillary refill takes less than 2 seconds.  Nursing note and vitals reviewed.    ED Treatments / Results  Labs (all labs ordered are listed, but only abnormal results are displayed) Labs Reviewed  CBC WITH DIFFERENTIAL/PLATELET  COMPREHENSIVE METABOLIC PANEL    EKG  EKG Interpretation  Date/Time:  Sunday November 01 2017 11:23:03 EST Ventricular Rate:  93 PR Interval:    QRS Duration: 97 QT Interval:  413 QTC Calculation: 514 R Axis:   43 Text Interpretation:  Sinus rhythm Poor data quality Confirmed by Pattricia Boss [phone]) on 11/01/2017 12:03:17 PM       Radiology Dg Chest Port 1 View  Result Date: 11/01/2017 CLINICAL DATA:  Shortness of Breath EXAM: PORTABLE CHEST 1 VIEW COMPARISON:  06/28/2017 FINDINGS: Severe diffuse interstitial opacities throughout the lungs, worsened since prior study. Heart is borderline in size. Possible small bilateral effusions. No acute bony abnormality. IMPRESSION: Severe diffuse interstitial disease which could reflect interstitial edema or interstitial pneumonia. Electronically Signed   By: Rolm Baptise M.D.   On: 11/01/2017 11:59    Procedures Procedures (including critical care time)  Medications Ordered in ED Medications - No data to display   Initial Impression / Assessment and Plan / ED Course  I have reviewed the triage vital signs and the nursing notes.  Pertinent labs & imaging results that were available during my care of the patient were reviewed by me and considered in my medical decision making (see chart for details).    71 year old man presents today with dyspnea secondary to volume overload due to noncompliance with dialysis.  Chest x-Nicholes Hibler consistent with volume overload.  EKG without evidence of acute ischemia.  Labs consistent with end-stage renal failure with normal potassium.  Discussed with Dr. Melvia Heaps on for nephrology.  Plan admission to hospitalist. Discussed with Dr. Evangeline Gula and she will admit Final  Clinical Impressions(s) / ED Diagnoses   Final diagnoses:  Shortness of breath  Acute pulmonary edema [facility])    ED Discharge Orders    None       Pattricia Boss, MD 11/01/17 1357

## 2017-11-01 NOTE — Consult Note (Signed)
Renal Service Consult Note [facility]  Trevor [name] 11/01/2017 [name] Requesting Physician:  Dr Evangeline Gula  Reason for Consult:  Resp distress HPI: The patient is a 71 y.o. 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 He was here in Aug 2018 for R axillofemoral bypass + fem- femoral bypass. Presented today to ED with resp distress. CXR showing pulm edema.  On bipap in ED.  Missed last HD on Friday, "I didn't feel good".    Denies any fever, chills, prod cough or CP.  NO n/v/d, no abd pain. AVF in L arm has been working good.   Pt lives alone, divorced, has 2 children.  Worked in a Loss adjuster, chartered.   ROS  denies CP  no joint pain   no HA  no blurry vision  no rash  no diarrhea  no nausea/ vomiting   Past Medical History  Past Medical History:  Diagnosis Date  . AAA (abdominal aortic aneurysm) ([place])   . Anxiety   . Arthritis   . Blindness   . CHF (congestive heart failure) ([place])   . 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 ESRD on hemodialysis ([place])    started HD 2014-15  . Headache(784.0)   . HTN (hypertension)   . Protein-calorie malnutrition ([place])    Past Surgical History  Past Surgical History:  Procedure Laterality Date  . AXILLARY-FEMORAL BYPASS GRAFT  06/28/2017   Procedure: BYPASS GRAFT RIGHT AXILLA-BIFEMORAL USING 8MM X 30CM AND 8MM X 60CM HEMASHIELD GOLD GRAFTS;  Surgeon: Rosetta Posner, MD;  Location: [place];  Service: Vascular;;  . CARDIAC CATHETERIZATION N/A 11/01/2016   Procedure: Left Heart Cath and Coronary Angiography;  Surgeon: Lorretta Harp, MD;  Location: [place] CV LAB;  Service: Cardiovascular;  Laterality: N/A;  . ENDARTERECTOMY FEMORAL Bilateral 06/28/2017   Procedure: ENDARTERECTOMY BILATERAL FEMORAL ARTERIES;  Surgeon: Rosetta Posner, MD;  Location: [place];  Service: Vascular;  Laterality: Bilateral;  . INSERTION  OF DIALYSIS CATHETER  09/21/2012   Procedure: INSERTION OF DIALYSIS CATHETER;  Surgeon: Angelia Mould, MD;  Location: [place];  Service: Vascular;  Laterality: N/A;  Right Internal Jugular Placement  . LEFT HEART CATHETERIZATION WITH CORONARY ANGIOGRAM N/A 09/28/2012   Procedure: LEFT HEART CATHETERIZATION WITH CORONARY ANGIOGRAM;  Surgeon: Sherren Mocha, MD;  Location[facility] C[facility] LAB;  Service: Cardiovascular;  Laterality: N/A;  . LEFT HEART CATHETERIZATION WITH CORONARY ANGIOGRAM N/A 07/07/2014   Procedure: LEFT HEART CATHETERIZATION WITH CORONARY ANGIOGRAM;  Surgeon: Leonie Man, MD;  Location: [facility] CATH LAB;  Service: Cardiovascular;  Laterality: N/A;  . right hand    . TEE WITHOUT CARDIOVERSION  10/09/2011   Procedure: TRANSESOPHAGEAL ECHOCARDIOGRAM (TEE);  Surgeon: Lelon Perla, MD;  Location: [facility] ENDOSCOPY;  Service: Cardiovascular;  Laterality: N/A;   Family History  Family History  Problem Relation Age of Onset  . Heart attack Mother        MI in her 48s  . Diabetes Mother   . Alcohol abuse Father   . Anesthesia problems Neg Hx   . Hypotension Neg Hx   . Malignant hyperthermia Neg Hx   . Pseudochol deficiency Neg Hx    Social History  reports that he has quit smoking. His smoking use included cigarettes. He has a 25.00 pack-year smoking history. he has  never used smokeless tobacco. He reports that he does not drink alcohol or use drugs. Allergies No Known Allergies Home medications Prior to Admission medications   Medication Sig Start Date End Date Taking? Authorizing Provider  Amino Acids-Protein Hydrolys (FEEDING SUPPLEMENT, PRO-STAT SUGAR FREE 64,) LIQD Take 30 mLs by mouth 2 (two) times daily.   Yes [provider]  bisacodyl (DULCOLAX) 10 MG suppository Place 10 mg rectally daily as needed (constipation not relieved by MOM).   Yes [provider]  metoprolol tartrate (LOPRESSOR) 25 MG tablet Take 1 tablet (25 mg total) by mouth 2 (two) times daily.  11/03/16  Yes Simmons, Brittainy M, PA-C  Omega-3 Fatty Acids (FISH OIL) 1000 MG CAPS Take 1,000 mg by mouth 2 (two) times daily.   Yes [provider]  amLODipine (NORVASC) 10 MG tablet Take 10 mg by mouth every evening. 04/13/17   [provider]  aspirin 81 MG chewable tablet Chew 1 tablet (81 mg total) by mouth daily. 11/04/16   Lyda Jester M, PA-C  atorvastatin (LIPITOR) 40 MG tablet Take 40 mg by mouth at bedtime.    [provider]  clopidogrel (PLAVIX) 75 MG tablet Take 1 tablet (75 mg total) by mouth daily. 04/05/15   Barton Dubois, MD  docusate sodium (COLACE) 100 MG capsule Take 200 mg by mouth daily.     [provider]  ferric citrate (AURYXIA) 1 GM 210 MG(Fe) tablet Take 210 mg by mouth 3 (three) times daily with meals.    [provider]  fluticasone (FLONASE) 50 MCG/ACT nasal spray Place into both nostrils daily.    [provider]  gabapentin (NEURONTIN) 100 MG capsule Take 1 capsule (100 mg total) by mouth 3 (three) times daily. 04/05/15   Barton Dubois, MD  hydrALAZINE (APRESOLINE) 25 MG tablet Take 25 mg by mouth 3 (three) times daily.    [provider]  isosorbide mononitrate (IMDUR) 30 MG 24 hr tablet Take 30 mg by mouth daily.    [provider]  multivitamin (RENA-VIT) TABS tablet Take 1 tablet by mouth at bedtime. 07/02/17   Velvet Bathe, MD  Nutritional Supplements (FEEDING SUPPLEMENT, NEPRO CARB STEADY,) LIQD Take 237 mLs by mouth 2 (two) times daily between meals. 12/11/16   Geradine Girt, DO   Liver Function Tests Recent Labs  Lab 11/01/17 1130  AST 18  ALT 13*  ALKPHOS 106  BILITOT 1.3*  PROT 7.5  ALBUMIN 3.2*   No results for input(s): LIPASE, AMYLASE in the last 168 hours. CBC Recent Labs  Lab 11/01/17 1130  WBC 10.7*  NEUTROABS 8.9*  HGB 11.8*  HCT 34.7*  MCV 86.5  PLT 643   Basic Metabolic Panel Recent Labs  Lab 11/01/17 1130  NA 136  K 5.0  CL 99*  CO2 21*   GLUCOSE 123*  BUN 81*  CREATININE 11.57*  CALCIUM 9.5   Iron/TIBC/Ferritin/ %Sat    Component Value Date/Time   IRON 63 05/20/2014 0630   TIBC 198 (L) 05/20/2014 0630   FERRITIN 494 (H) 05/20/2014 0630   IRONPCTSAT 32 05/20/2014 0630    Vitals:   11/01/17 1415 11/01/17 1416 11/01/17 1430 11/01/17 1445  BP: (!) 175/97  (!) 190/93 (!) 186/90  Pulse:   83 88  Resp:   18 (!) 21  Temp:      TempSrc:      SpO2: (!) 88% 91% (!) 89% 92%   Exam Gen on bipap, took mask off, pt w raspy  breathing but no distress No rash, cyanosis or gangrene Sclera anicteric, throat clear  No jvd or bruits Chest bilat diffuse mild wheezing, coarse rales/ rhonchi bilat RRR no MRG Abd soft ntnd no mass or ascites +bs  GU normal male MS no joint effusions or deformity  Ext no LE edema / no wounds or ulcers Neuro is alert, Ox 3 , nf    Dialysis:  [place] MWF  4h   60kg  2/2 bath   LUA AVF   Hep none -venofer 50/wk -mircera 50 due 12/3 -hect 4 ug tiw -home BP: norvasc 10/ hydral 25 tid/ metop 25 bid   Impression: 1. Resp distress/ pulm edema - due to missed HD and vol overload, on bipap, still sounding wet 2. ESRD usual HD MWF 3. HTN - BPs up w/ ^vol, home BP meds as above 4. PAD hx axillofemoral and fem-fem bypass in August 5. NICM/ hx CAD 6. Partially blind 7. Hx CVA    Plan - HD today upstairs asap  Kelly Splinter MD [facility] pager [phone]   11/01/2017, 3:16 PM

## 2017-11-01 NOTE — ED Notes (Signed)
Ulice Dash RN given report on 57M. Report given to dialysis

## 2017-11-01 NOTE — H&P (Signed)
History and Physical    Trevor [name] KXF:[id number] DOB: 24-Jan-1946 DOA: 11/01/2017  PCP: [facility] Patient coming from: [place]  I have personally briefly reviewed patient's old medical records in [place]  Chief Complaint: Shortness of breath with sats 68% per EMS missed dialysis on Friday.  HPI: Trevor [name] is a 71 y.o. 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  He was brought in via EMS who reported being called to the nursing home due to respiratory distress.  They found that the patient had oxygen saturations of 50% on the arrival.  He was placed on CPAP and sats began to increase to 80-90%.  They state he was trying to talk to them in route and he was gesticulating.  He is unable to provide any useful history due to BiPAP and his history is obtained from the ER physician, the chart and records.  ED Course: Evaluation in the emergency department revealed significant volume overload due to noncompliance with dialysis.  The patient's chest x-ray shows volume overload.  EKG shows no acute ischemia.  Labs are consistent with end-stage renal failure with normal potassium.  Dr. Jonnie Finner from nephrology was consulted and we were consulted for admission.  Review of Systems: Patient was unable to provide any history due to BiPAP   Past Medical History:  Diagnosis Date  . AAA (abdominal aortic aneurysm) ([place])   . Anxiety   . Arthritis   . Blindness   . CHF (congestive heart failure)  ([place])   . 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 ESRD on hemodialysis ([place])    started HD 2014-15  . Headache(784.0)   . HTN (hypertension)   . Protein-calorie malnutrition ([place])     Past Surgical History:  Procedure Laterality Date  . AXILLARY-FEMORAL BYPASS GRAFT  06/28/2017   Procedure: BYPASS GRAFT RIGHT AXILLA-BIFEMORAL USING 8MM X 30CM AND 8MM X 60CM HEMASHIELD GOLD GRAFTS;  Surgeon: Rosetta Posner, MD;  Location: [place];  Service: Vascular;;  . CARDIAC CATHETERIZATION N/A 11/01/2016   Procedure: Left Heart Cath and Coronary Angiography;  Surgeon: Lorretta Harp, MD;  Location: [place] CV LAB;  Service: Cardiovascular;  Laterality: N/A;  . ENDARTERECTOMY FEMORAL Bilateral 06/28/2017   Procedure: ENDARTERECTOMY BILATERAL FEMORAL ARTERIES;  Surgeon: Rosetta Posner, MD;  Location: [place];  Service: Vascular;  Laterality: Bilateral;  . INSERTION OF DIALYSIS CATHETER  09/21/2012   Procedure: INSERTION OF DIALYSIS CATHETER;  Surgeon: Angelia Mould, MD;  Location: [place];  Service: Vascular;  Laterality: N/A;  Right Internal Jugular Placement  . LEFT HEART CATHETERIZATION WITH CORONARY ANGIOGRAM N/A 09/28/2012   Procedure: LEFT HEART CATHETERIZATION WITH CORONARY ANGIOGRAM;  Surgeon: Sherren Mocha, MD;  Location[facility] CATH LAB;  Service: Cardiovascular;  Laterality: N/A;  . LEFT HEART CATHETERIZATION WITH CORONARY ANGIOGRAM N/A 07/07/2014   Procedure: LEFT HEART CATHETERIZATION WITH CORONARY ANGIOGRAM;  Surgeon: Leonie Man, MD;  Location: [facility] CATH LAB;  Service: Cardiovascular;  Laterality: N/A;  . right hand    . TEE WITHOUT CARDIOVERSION  10/09/2011   Procedure: TRANSESOPHAGEAL ECHOCARDIOGRAM (TEE);  Surgeon: Lelon Perla, MD;  Location: [facility] ENDOSCOPY;  Service: Cardiovascular;  Laterality: N/A;     reports that he has quit smoking. His smoking use included cigarettes. He has a 25.00 pack-year smoking history. he has never used  smokeless tobacco. He reports that he does not drink alcohol or use drugs.  No Known Allergies  Family History  Problem Relation Age of Onset  . Heart attack Mother        MI in her 47s  . Diabetes Mother   . Alcohol abuse Father   . Anesthesia problems Neg Hx   . Hypotension Neg Hx   . Malignant hyperthermia Neg Hx   . Pseudochol deficiency Neg Hx      Prior to Admission medications   Medication Sig Start Date End Date Taking? Authorizing Provider  Amino Acids-Protein Hydrolys (FEEDING SUPPLEMENT, PRO-STAT SUGAR FREE 64,) LIQD Take 30 mLs by mouth 2 (two) times daily.   Yes [provider]  amLODipine (NORVASC) 10 MG tablet Take 10 mg by mouth at bedtime.  04/13/17  Yes [provider]  aspirin 81 MG chewable tablet Chew 1 tablet (81 mg total) by mouth daily. 11/04/16  Yes Lyda Jester M, PA-C  atorvastatin (LIPITOR) 40 MG tablet Take 40 mg by mouth at bedtime.   Yes [provider]  bisacodyl (DULCOLAX) 10 MG suppository Place 10 mg rectally daily as needed (constipation not relieved by MOM).   Yes [provider]  clopidogrel (PLAVIX) 75 MG tablet Take 1 tablet (75 mg total) by mouth daily. Patient taking differently: Take 75 mg by mouth at bedtime.  04/05/15  Yes Barton Dubois, MD  docusate sodium (COLACE) 100 MG capsule Take 200 mg by mouth at bedtime.    Yes [provider]  ferric citrate (AURYXIA) 1 GM 210 MG(Fe) tablet Take 630 mg by mouth 3 (three) times daily with meals.    Yes [provider]  fluticasone (FLONASE) 50 MCG/ACT nasal spray Place 1 spray into both nostrils 2 (two) times daily.    Yes [provider]  gabapentin (NEURONTIN) 100 MG capsule Take 1 capsule (100 mg total) by mouth 3 (three) times daily. 04/05/15  Yes Barton Dubois, MD  hydrALAZINE (APRESOLINE) 25 MG tablet Take 25 mg by mouth 3 (three) times daily.   Yes [provider]  isosorbide mononitrate (IMDUR) 30 MG 24 hr tablet Take  30 mg by mouth daily.   Yes [provider]  metoprolol tartrate (LOPRESSOR) 25 MG tablet Take 1 tablet (25 mg total) by mouth 2 (two) times daily. 11/03/16  Yes Lyda Jester M, PA-C  multivitamin (RENA-VIT) TABS tablet Take 1 tablet by mouth at bedtime. 07/02/17  Yes Velvet Bathe, MD  Nutritional Supplements (FEEDING SUPPLEMENT, NEPRO CARB STEADY,) LIQD Take 237 mLs by mouth 2 (two) times daily between meals. 12/11/16  Yes Vann, Tomi Bamberger, DO  Omega-3 Fatty Acids (FISH OIL) 1000 MG CAPS Take 1,000 mg by mouth 2 (two) times daily.   Yes [provider]  sodium phosphate (FLEET) 7-19 GM/118ML ENEM Place 1 enema rectally daily as needed (constipation not relieved by MOM or bisacodyl suppository).   Yes [provider]    Physical Exam: Vitals:   11/01/17 1445 11/01/17 1500 11/01/17 1515 11/01/17 1621  BP: (!) 186/90 (!) 183/85 (!) 186/95   Pulse: 88 88 90 (!) 101  Resp: (!) 21 17 20    Temp:  TempSrc:      SpO2: 92% 91% 93% 92%    Constitutional: NAD, calm, comfortable Vitals:   11/01/17 1445 11/01/17 1500 11/01/17 1515 11/01/17 1621  BP: (!) 186/90 (!) 183/85 (!) 186/95   Pulse: 88 88 90 (!) 101  Resp: (!) 21 17 20    Temp:      TempSrc:      SpO2: 92% 91% 93% 92%   Eyes: PERRL, lids and conjunctivae normal ENMT: Mucous membranes are moist. Posterior pharynx clear of any exudate or lesions.Normal dentition.  Neck: normal, supple, no masses, no thyromegaly Respiratory: Coarse breath sounds bilaterally, no wheezing, crackles halfway up both lung fields.  Crease respiratory effort.  Moderate accessory muscle use.  Cardiovascular: Regular rate and rhythm, no murmurs / rubs / gallops. No extremity edema. 2+ pedal pulses. No carotid bruits.  Abdomen: no tenderness, no masses palpated. No hepatosplenomegaly. Bowel sounds positive.  Musculoskeletal: no clubbing / cyanosis. No joint deformity upper and lower extremities. Good ROM, no contractures. Normal  muscle tone.  Skin: no rashes, lesions, ulcers. No induration Neurologic: CN 2-12 grossly intact. Sensation intact, DTR normal. Strength 5/5 in all 4.  Psychiatric: Normal judgment and insight. Alert and oriented x 3. Normal mood.     Labs on Admission: I have personally reviewed following labs and imaging studies  CBC: Recent Labs  Lab 11/01/17 1130  WBC 10.7*  NEUTROABS 8.9*  HGB 11.8*  HCT 34.7*  MCV 86.5  PLT 664   Basic Metabolic Panel: Recent Labs  Lab 11/01/17 1130  NA 136  K 5.0  CL 99*  CO2 21*  GLUCOSE 123*  BUN 81*  CREATININE 11.57*  CALCIUM 9.5   GFR: Estimated Creatinine Clearance: 5 mL/min (A) (by C-G formula based on SCr of 11.57 mg/dL (H)). Liver Function Tests: Recent Labs  Lab 11/01/17 1130  AST 18  ALT 13*  ALKPHOS 106  BILITOT 1.3*  PROT 7.5  ALBUMIN 3.2*   No results for input(s): LIPASE, AMYLASE in the last 168 hours. No results for input(s): AMMONIA in the last 168 hours. Coagulation Profile: No results for input(s): INR, PROTIME in the last 168 hours. Cardiac Enzymes: No results for input(s): CKTOTAL, CKMB, CKMBINDEX, TROPONINI in the last 168 hours. BNP (last 3 results) No results for input(s): PROBNP in the last 8760 hours. HbA1C: No results for input(s): HGBA1C in the last 72 hours. CBG: No results for input(s): GLUCAP in the last 168 hours. Lipid Profile: No results for input(s): CHOL, HDL, LDLCALC, TRIG, CHOLHDL, LDLDIRECT in the last 72 hours. Thyroid Function Tests: No results for input(s): TSH, T4TOTAL, FREET4, T3FREE, THYROIDAB in the last 72 hours. Anemia Panel: No results for input(s): VITAMINB12, FOLATE, FERRITIN, TIBC, IRON, RETICCTPCT in the last 72 hours. Urine analysis:    Component Value Date/Time   COLORURINE YELLOW 04/04/2017 1833   APPEARANCEUR CLEAR 04/04/2017 1833   LABSPEC 1.008 04/04/2017 1833   PHURINE 9.0 (H) 04/04/2017 1833   GLUCOSEU 50 (A) 04/04/2017 1833   HGBUR MODERATE (A) 04/04/2017 1833     BILIRUBINUR NEGATIVE 04/04/2017 1833   KETONESUR NEGATIVE 04/04/2017 1833   PROTEINUR 100 (A) 04/04/2017 1833   UROBILINOGEN 0.2 01/08/2015 2116   NITRITE NEGATIVE 04/04/2017 1833   LEUKOCYTESUR NEGATIVE 04/04/2017 1833    Radiological Exams on Admission: Dg Chest Port 1 View  Result Date: 11/01/2017 CLINICAL DATA:  Shortness of Breath EXAM: PORTABLE CHEST 1 VIEW COMPARISON:  06/28/2017 FINDINGS: Severe diffuse interstitial opacities throughout the lungs, worsened since prior study.  Heart is borderline in size. Possible small bilateral effusions. No acute bony abnormality. IMPRESSION: Severe diffuse interstitial disease which could reflect interstitial edema or interstitial pneumonia. Electronically Signed   By: Rolm Baptise M.D.   On: 11/01/2017 11:59    EKG: Independently reviewed.  Sinus rhythm poor data quality.  Assessment/Plan Principal Problem:   Pulmonary edema Active Problems:   End-stage renal disease on hemodialysis (HCC)   Nonischemic cardiomyopathy (HCC)   Coronary artery disease, non-occlusive:    History of stroke   Non compliance with medical treatment   Essential hypertension   Peripheral arterial disease (HCC)   Partial blindness   1.  Pulmonary edema: Due to noncompliance with hemodialysis recently.  Patient will require dialysis today.  I have discussed the case with Dr. Jonnie Finner will see the patient in consultation.  2.  End-stage renal disease on hemodialysis: Dialysis per recommendations of Dr. Jonnie Finner.  3.  Nonischemic cardiomyopathy: Continue hydralazine, Imdur, metoprolol, omega-3 fatty acids, aspirin,  4.  Coronary artery disease nonocclusive: Continue outpatient regimen including Plavix, aspirin, Lipitor, hydralazine, Imdur, metoprolol, omega-3 fatty acid  5.  History of stroke: Noted continue aspirin and Plavix  6.  Noncompliance with medical treatment: Patient encouraged to follow-up as directed keep dialysis appointments.  7.  Essential  hypertension: Continue amlodipine, hydralazine, Imdur, and metoprolol.  8.  Peripheral arterial disease: Continue Plavix and aspirin.  9.  Partial blindness: Sequelae of stroke.   DVT prophylaxis: Sequential compression devices Code Status: Full code Family Communication: No family present.  Patient retains capacity. Disposition Plan: Likely home in 48-72 hours Consults called: Dr. Jonnie Finner from nephrology Admission status: Inpatient   Lady Deutscher MD [place] [facility] Pager [phone] If 7PM-7AM, please contact night-coverage [email or web address] Password [facility]  11/01/2017, 4:58 PM

## 2017-11-01 NOTE — Procedures (Signed)
   I was present at this dialysis session, have reviewed the session itself and made  appropriate changes Kelly Splinter MD [place] pager [phone]   11/01/2017, 6:02 PM

## 2017-11-01 NOTE — ED Triage Notes (Signed)
Pt here from [facility] home with c/o sob , sat's with ems 68% on NRB 78 % on cpap , pt missed dialysis on friday

## 2017-11-02 DIAGNOSIS — R0602 Shortness of breath: Secondary | ICD-10-CM

## 2017-11-02 DIAGNOSIS — Z992 Dependence on renal dialysis: Secondary | ICD-10-CM

## 2017-11-02 DIAGNOSIS — N186 End stage renal disease: Secondary | ICD-10-CM

## 2017-11-02 DIAGNOSIS — J810 Acute pulmonary edema: Principal | ICD-10-CM

## 2017-11-02 DIAGNOSIS — I10 Essential (primary) hypertension: Secondary | ICD-10-CM

## 2017-11-02 DIAGNOSIS — H547 Unspecified visual loss: Secondary | ICD-10-CM

## 2017-11-02 DIAGNOSIS — I428 Other cardiomyopathies: Secondary | ICD-10-CM

## 2017-11-02 DIAGNOSIS — I2510 Atherosclerotic heart disease of native coronary artery without angina pectoris: Secondary | ICD-10-CM

## 2017-11-02 DIAGNOSIS — I739 Peripheral vascular disease, unspecified: Secondary | ICD-10-CM

## 2017-11-02 DIAGNOSIS — Z9119 Patient's noncompliance with other medical treatment and regimen: Secondary | ICD-10-CM

## 2017-11-02 LAB — BASIC METABOLIC PANEL
ANION GAP: 13 (ref 5–15)
BUN: 45 mg/dL — AB (ref 6–20)
CHLORIDE: 98 mmol/L — AB (ref 101–111)
CO2: 25 mmol/L (ref 22–32)
Calcium: 9.8 mg/dL (ref 8.9–10.3)
Creatinine, Ser: 7.8 mg/dL — ABNORMAL HIGH (ref 0.61–1.24)
GFR calc Af Amer: 7 mL/min — ABNORMAL LOW (ref >60)
GFR, EST NON AFRICAN AMERICAN: 6 mL/min — AB (ref >60)
GLUCOSE: 84 mg/dL (ref 65–99)
POTASSIUM: 4.4 mmol/L (ref 3.5–5.1)
Sodium: 136 mmol/L (ref 135–145)

## 2017-11-02 LAB — CBC
HCT: 31.7 % — ABNORMAL LOW (ref 39.0–52.0)
HEMOGLOBIN: 10.8 g/dL — AB (ref 13.0–17.0)
MCH: 29 pg (ref 26.0–34.0)
MCHC: 34.1 g/dL (ref 30.0–36.0)
MCV: 85 fL (ref 78.0–100.0)
Platelets: 201 10*3/uL (ref 150–400)
RBC: 3.73 MIL/uL — ABNORMAL LOW (ref 4.22–5.81)
RDW: 14 % (ref 11.5–15.5)
WBC: 6.6 10*3/uL (ref 4.0–10.5)

## 2017-11-02 NOTE — Progress Notes (Signed)
New Admission Note:  Arrival Method: By bed from ED around 2130 Mental Orientation: Alert and oriented Telemetry: Box 6, CCMD notified Assessment: Completed Skin: Completed, refer to flowsheets IV: Right hand Pain: Denies Tubes: None Safety Measures: Safety Fall Prevention Plan was given, discussed Admission: Completed [facility] Orientation: Patient has been orientated to the room, unit and the staff. Family: None  Orders have been reviewed and implemented. Will continue to monitor the patient. Call light has been placed within reach and bed alarm has been activated.   Perry Mount, RN  Phone Number: [phone]

## 2017-11-02 NOTE — Progress Notes (Signed)
Subjective:  No cos, "I should not miss any more HD"/ hD till 8pm last pm   am   Objective Vital signs in last 24 hours: Vitals:   11/01/17 2119 11/02/17 0247 11/02/17 0502 11/02/17 0955  BP: (!) 183/83  (!) 170/74 (!) 154/77  Pulse: 92  91 86  Resp: 20  19 18   Temp: 97.9 F (36.6 C)  99.1 F (37.3 C) 98.1 F (36.7 C)  TempSrc: Axillary  Oral Oral  SpO2: 95%  98% 96%  Weight: 59.7 kg (131 lb 9.8 oz) 59.7 kg (131 lb 9.8 oz)    Height: 5\' 10"  (1.778 m)      Weight change:   Physical Exam: General: Alert thin AAM  NAD , OX3 Heart: RRR  No mrg Lungs: bilat soft rales Abdomen: soft Nt, ND  Extremities:  No pedal edema  Dialysis Access: Pos bruit  LA AVF   Dialysis:  [place] MWF  4h   60kg  2/2 bath   LUA AVF   Hep none -venofer 50/wk -mircera 50 due 12/3 -hect 4 ug tiw -home BP: norvasc 10/ hydral 25 tid/ metop 25 bid   Problem/Plan: 1.  Resp distress/ pulm edema - due to missed HD and vol overload, HD  2.7 cc uf last night  To 59.7 wt   At edw  , no sob this am  2. ESRD- usual HD MWF, hd yest  for vol , keep on HD later today  3. HTN - BPs up w/ Orlin Hilding on admit =  home BP meds/ uf on hd. 4. PAD hx axillofemoral and fem-fem bypass in August 5. 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 NICM/ hx CAD 8. Partially blind 9. Hx CVA = lives at [facility] [place] [phone] 11/02/2017,12:06 PM  LOS: 1 day   Labs: Basic Metabolic Panel: Recent Labs  Lab 11/01/17 1130 11/02/17 0626  NA 136 136  K 5.0 4.4  CL 99* 98*  CO2 21* 25  GLUCOSE 123* 84  BUN 81* 45*  CREATININE 11.57* PENDING  CALCIUM 9.5 9.8   Liver Function Tests: Recent Labs  Lab 11/01/17 1130  AST 18  ALT 13*  ALKPHOS 106  BILITOT 1.3*  PROT 7.5  ALBUMIN 3.2*   No results for input(s): LIPASE, AMYLASE in the last 168 hours. No results for input(s): AMMONIA in the last 168 hours. CBC: Recent Labs  Lab 11/01/17 1130  11/02/17 0626  WBC 10.7* 6.6  NEUTROABS 8.9*  --   HGB 11.8* 10.8*  HCT 34.7* 31.7*  MCV 86.5 85.0  PLT 194 201   Cardiac Enzymes: No results for input(s): CKTOTAL, CKMB, CKMBINDEX, TROPONINI in the last 168 hours. CBG: No results for input(s): GLUCAP in the last 168 hours.  Studies/Results: Dg Chest Port 1 View  Result Date: 11/01/2017 CLINICAL DATA:  Shortness of Breath EXAM: PORTABLE CHEST 1 VIEW COMPARISON:  06/28/2017 FINDINGS: Severe diffuse interstitial opacities throughout the lungs, worsened since prior study. Heart is borderline in size. Possible small bilateral effusions. No acute bony abnormality. IMPRESSION: Severe diffuse interstitial disease which could reflect interstitial edema or interstitial pneumonia. Electronically Signed   By: Rolm Baptise M.D.   On: 11/01/2017 11:59   Medications: . sodium chloride     . amLODipine  10 mg Oral QPM  . aspirin  81 mg Oral Daily  . atorvastatin  40 mg Oral QHS  .  clopidogrel  75 mg Oral Daily  . docusate sodium  200 mg Oral Daily  . feeding supplement (NEPRO CARB STEADY)  237 mL Oral BID BM  . feeding supplement (PRO-STAT SUGAR FREE 64)  30 mL Oral BID  . ferric citrate  210 mg Oral TID WC  . fluticasone  2 spray Each Nare BID  . gabapentin  100 mg Oral TID  . hydrALAZINE  25 mg Oral TID  . isosorbide mononitrate  30 mg Oral Daily  . metoprolol tartrate  25 mg Oral BID  . multivitamin  1 tablet Oral QHS  . omega-3 acid ethyl esters  1 g Oral Daily  . sodium chloride flush  3 mL Intravenous Q12H

## 2017-11-02 NOTE — Progress Notes (Addendum)
PROGRESS NOTE    Trevor [name]  DTO:[id number] DOB: Dec 02, 1945 DOA: 11/01/2017 PCP: [name]   Chief Complaint  Patient presents with  . Shortness of Breath    Brief Narrative:  HPI On 11/01/2017 by Dr. Randa Spike Trevor [name] is a 71 y.o. 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  He was brought in via EMS who reported being called to the nursing home due to respiratory distress.  They found that the patient had oxygen saturations of 50% on the arrival.  He was placed on CPAP and sats began to increase to 80-90%.  They state he was trying to talk to them in route and he was gesticulating.  He is unable to provide any useful history due to BiPAP and his history is obtained from the ER physician, the chart and records.  Assessment & Plan   Acute hypoxic respiratory failure/Pulmonary Edema secondary to missed hemodialysis  -Upon arrival to the emergency department, SPO2 of 86% -nephrology consulted and appreciated -patient required urgent dialysis on 11/01/2017  End-stage renal disease -dialyzes on M,W, F -nephrology consulted and appreciated   Nonischemic cardiomyopathy/coronary artery disease -Currently stable, continue hydralazine, Imdur, Toprol, aspirin, Plavix, statin -Currently chest pain-free  Essential hypertension -Continue felodipine, hydralazine, Imdur, metoprolol  Peripheral arterial disease -Continues aspirin, Plavix  Partial blindness/history of CVA -Continue Plavix, aspirin,  statin  Anemia of chronic disease -Currently stable, continue monitor CBC -Continue iron replacement  DVT Prophylaxis  SCDs  Code Status: Full  Family Communication: none at bedside  Disposition Plan: Back to [facility] when medically stable  Consultants Nephrology   Procedures  None  Antibiotics   Anti-infectives (From admission, onward)   None      Subjective:   Trevor [name] seen and examined today.  Feels breathing is mildly improved. Feels very weak and fatigued. Denies chest pain, abdominal pain, nausea or vomiting, diarrhea or constipation.   Objective:   Vitals:   11/01/17 2119 11/02/17 0247 11/02/17 0502 11/02/17 0955  BP: (!) 183/83  (!) 170/74 (!) 154/77  Pulse: 92  91 86  Resp: 20  19 18   Temp: 97.9 F (36.6 C)  99.1 F (37.3 C) 98.1 F (36.7 C)  TempSrc: Axillary  Oral Oral  SpO2: 95%  98% 96%  Weight: 59.7 kg (131 lb 9.8 oz) 59.7 kg (131 lb 9.8 oz)    Height: 5\' 10"  (1.778 m)       Intake/Output Summary (Last 24 hours) at 11/02/2017 1316 Last data filed at 11/02/2017 1000 Gross per 24 hour  Intake 240 ml  Output 2700 ml  Net -2460 ml   Filed Weights   11/01/17 2119 11/02/17 0247  Weight: 59.7 kg (131 lb 9.8 oz) 59.7 kg (131 lb 9.8 oz)    Exam  General: Well developed, chronically ill-appearing, NAD  HEENT: NCAT, mucous membranes moist.   Cardiovascular: S1 S2 auscultated, no rubs, murmurs or gallops. Regular rate and rhythm.  Respiratory: Clear to auscultation bilaterally with equal chest rise  Abdomen: Soft, nontender, nondistended, + bowel sounds  Extremities: warm dry without cyanosis clubbing or edema  Neuro: AAOx3, history of partial  blindness, otherwise nonfocal  Psych: Normal affect and demeanor  Data Reviewed: I have personally reviewed following labs and imaging studies  CBC: Recent Labs  Lab 11/01/17 1130 11/02/17 0626  WBC 10.7* 6.6  NEUTROABS 8.9*  --   HGB 11.8* 10.8*  HCT 34.7* 31.7*  MCV 86.5 85.0  PLT  194 259   Basic Metabolic Panel: Recent Labs  Lab 11/01/17 1130 11/02/17 0626  NA 136 136  K 5.0 4.4  CL 99* 98*  CO2 21* 25  GLUCOSE 123* 84  BUN 81* 45*  CREATININE 11.57* PENDING  CALCIUM 9.5 9.8   GFR: CrCl cannot be calculated (This lab value cannot be used to calculate CrCl because it is not a number: PENDING). Liver Function Tests: Recent Labs  Lab 11/01/17 1130  AST 18  ALT 13*  ALKPHOS 106  BILITOT 1.3*  PROT 7.5  ALBUMIN 3.2*   No results for input(s): LIPASE, AMYLASE in the last 168 hours. No results for input(s): AMMONIA in the last 168 hours. Coagulation Profile: No results for input(s): INR, PROTIME in the last 168 hours. Cardiac Enzymes: No results for input(s): CKTOTAL, CKMB, CKMBINDEX, TROPONINI in the last 168 hours. BNP (last 3 results) No results for input(s): PROBNP in the last 8760 hours. HbA1C: No results for input(s): HGBA1C in the last 72 hours. CBG: No results for input(s): GLUCAP in the last 168 hours. Lipid Profile: No results for input(s): CHOL, HDL, LDLCALC, TRIG, CHOLHDL, LDLDIRECT in the last 72 hours. Thyroid Function Tests: No results for input(s): TSH, T4TOTAL, FREET4, T3FREE, THYROIDAB in the last 72 hours. Anemia Panel: No results for input(s): VITAMINB12, FOLATE, FERRITIN, TIBC, IRON, RETICCTPCT in the last 72 hours. Urine analysis:    Component Value Date/Time   COLORURINE YELLOW 04/04/2017 1833   APPEARANCEUR CLEAR 04/04/2017 1833   LABSPEC 1.008 04/04/2017 1833   PHURINE 9.0 (H) 04/04/2017 1833   GLUCOSEU 50 (A) 04/04/2017 1833   HGBUR MODERATE (A) 04/04/2017 1833   BILIRUBINUR NEGATIVE 04/04/2017 1833   KETONESUR NEGATIVE 04/04/2017 1833   PROTEINUR 100 (A) 04/04/2017 1833   UROBILINOGEN 0.2 01/08/2015 2116   NITRITE NEGATIVE 04/04/2017 1833   LEUKOCYTESUR NEGATIVE 04/04/2017 1833   Sepsis Labs: @LABRCNTIP (procalcitonin:4,lacticidven:4)  ) Recent Results (from the past 240 hour(s))  MRSA PCR Screening      Status: None   Collection Time: 11/01/17  9:38 PM  Result Value Ref Range Status   MRSA by PCR NEGATIVE NEGATIVE Final    Comment:        The GeneXpert MRSA Assay (FDA approved for NASAL specimens only), is one component of a comprehensive MRSA colonization surveillance program. It is not intended to diagnose MRSA infection nor to guide or monitor treatment for MRSA infections.       Radiology Studies: Dg Chest Port 1 View  Result Date: 11/01/2017 CLINICAL DATA:  Shortness of Breath EXAM: PORTABLE CHEST 1 VIEW COMPARISON:  06/28/2017 FINDINGS: Severe diffuse interstitial opacities throughout the lungs, worsened since prior study. Heart is borderline in size. Possible small bilateral effusions. No acute bony abnormality. IMPRESSION: Severe diffuse interstitial disease which could reflect interstitial edema or interstitial pneumonia. Electronically Signed   By: Rolm Baptise M.D.   On: 11/01/2017 11:59     Scheduled Meds: . amLODipine  10 mg Oral QPM  . aspirin  81 mg Oral Daily  . atorvastatin  40 mg Oral QHS  . clopidogrel  75 mg Oral Daily  . docusate sodium  200 mg Oral Daily  .  feeding supplement (NEPRO CARB STEADY)  237 mL Oral BID BM  . feeding supplement (PRO-STAT SUGAR FREE 64)  30 mL Oral BID  . ferric citrate  210 mg Oral TID WC  . fluticasone  2 spray Each Nare BID  . gabapentin  100 mg Oral TID  . hydrALAZINE  25 mg Oral TID  . isosorbide mononitrate  30 mg Oral Daily  . metoprolol tartrate  25 mg Oral BID  . multivitamin  1 tablet Oral QHS  . omega-3 acid ethyl esters  1 g Oral Daily  . sodium chloride flush  3 mL Intravenous Q12H   Continuous Infusions: . sodium chloride       LOS: 1 day   Time Spent in minutes   30 minutes  Tatiana Courter D.O. on 11/02/2017 at 1:16 PM  Between 7am to 7pm - Pager - [phone]  After 7pm go to [email or web address] - password [email or web address]  And look for the night coverage person covering for me after hours  [facility]  Group Office  [phone]

## 2017-11-03 ENCOUNTER — Inpatient Hospital Stay (HOSPITAL_COMMUNITY): Payer: Medicare Other

## 2017-11-03 DIAGNOSIS — Z8673 Personal history of transient ischemic attack (TIA), and cerebral infarction without residual deficits: Secondary | ICD-10-CM

## 2017-11-03 LAB — BASIC METABOLIC PANEL
Anion gap: 14 (ref 5–15)
BUN: 23 mg/dL — AB (ref 6–20)
CALCIUM: 9.4 mg/dL (ref 8.9–10.3)
CO2: 26 mmol/L (ref 22–32)
Chloride: 96 mmol/L — ABNORMAL LOW (ref 101–111)
Creatinine, Ser: 4.77 mg/dL — ABNORMAL HIGH (ref 0.61–1.24)
GFR calc Af Amer: 13 mL/min — ABNORMAL LOW (ref >60)
GFR, EST NON AFRICAN AMERICAN: 11 mL/min — AB (ref >60)
GLUCOSE: 91 mg/dL (ref 65–99)
POTASSIUM: 3.4 mmol/L — AB (ref 3.5–5.1)
SODIUM: 136 mmol/L (ref 135–145)

## 2017-11-03 LAB — CBC
HCT: 33.2 % — ABNORMAL LOW (ref 39.0–52.0)
Hemoglobin: 11.2 g/dL — ABNORMAL LOW (ref 13.0–17.0)
MCH: 29 pg (ref 26.0–34.0)
MCHC: 33.7 g/dL (ref 30.0–36.0)
MCV: 86 fL (ref 78.0–100.0)
PLATELETS: 212 10*3/uL (ref 150–400)
RBC: 3.86 MIL/uL — AB (ref 4.22–5.81)
RDW: 13.9 % (ref 11.5–15.5)
WBC: 6.8 10*3/uL (ref 4.0–10.5)

## 2017-11-03 MED ORDER — POTASSIUM CHLORIDE CRYS ER 20 MEQ PO TBCR
20.0000 meq | EXTENDED_RELEASE_TABLET | Freq: Once | ORAL | Status: AC
Start: 2017-11-03 — End: 2017-11-03
  Administered 2017-11-03: 20 meq via ORAL
  Filled 2017-11-03: qty 1

## 2017-11-03 NOTE — [place] FL2 (Signed)
[place] MEDICAID FL2 LEVEL OF CARE SCREENING TOOL     IDENTIFICATION  Patient Name: Trevor [name] Birthdate: 08/10/1946 Sex: male Admission Date (Current Location): 11/01/2017  [facility] and [place] Number:  Herbalist and Address:  [facility] [place], [place] [address], [place], [place] [address]      Provider Number: [id number]  Attending Physician Name and Address:  Cristal Ford, DO  Relative Name and Phone Number:  Milik Gilreath - daughter - [phone]    Current Level of Care: Hospital Recommended Level of Care: [place] Prior Approval Number:    Date Approved/Denied:   PASRR Number: [id number](Eff. 10/09/11)  Discharge Plan: [name]    Current Diagnoses: Patient Active Problem List   Diagnosis Date Noted  . 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 Vascular device, implant, or graft complication 67/67/2094  . Peripheral arterial disease ([place]) 10/20/2017  . Non-allergic rhinitis 10/20/2017  . Pain due to onychomycosis of toenails of both feet 07/30/2017  . 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 BPH (benign prostatic hyperplasia) 07/07/2017  . At risk for adverse drug reaction 07/02/2017  . Ischemia of extremity 06/28/2017  . Pulmonary edema 06/27/2017  . Weakness of both legs 01/25/2017  . Hypertensive urgency 01/24/2017  . 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 Ischemic chest pain 11/01/2016  . Hypoxemia 09/07/2016  . Essential hypertension 03/08/2016  . Orthostatic hypotension 01/12/2016  . Dyslipidemia 01/12/2016  . Cough   . Weakness generalized 12/26/2015  . Chest pain 08/12/2015  . Accelerated hypertension 08/12/2015  . Carotid stenosis   . Stroke ([place]) 04/03/2015  . Abdominal aortic aneurysm ([place]) 03/06/2015  . Generalized weakness 09/08/2014  . Acute encephalopathy 09/07/2014  . Protein-calorie malnutrition, severe ([place]) 05/20/2014  . Hypokalemia 05/19/2014  . Syncope and collapse 05/19/2014  .  End-stage renal disease on hemodialysis ([place]) 05/19/2014  . Anemia of renal disease 05/19/2014  . Coronary artery disease, non-occlusive:  09/28/2012  . Anemia 09/27/2012  . Secondary hyperparathyroidism ([place]) 09/27/2012  . Non compliance with medical treatment 09/27/2012  . Chronic combined systolic and diastolic CHF (congestive heart failure) ([place]) 10/10/2011  . A-fib ([place]) 10/08/2011  . History of stroke 10/07/2011  . Cocaine abuse ([place]) 10/07/2011  . Severe sinus bradycardia 10/07/2011  . Nonischemic cardiomyopathy ([place]) 09/18/2011  . Tobacco use disorder 08/07/2011  . Bruit 08/07/2011    Orientation RESPIRATION BLADDER Height & Weight     Self, Time, Situation, Place  O2(3 Liters Oxygen) Continent Weight: 128 lb 1.4 oz (58.1 kg) Height:  5\' 10"  (177.8 cm)  BEHAVIORAL SYMPTOMS/MOOD NEUROLOGICAL BOWEL NUTRITION STATUS      Incontinent Diet(Renal with 1200 mL fluid restriction)  AMBULATORY STATUS COMMUNICATION OF NEEDS Skin   Limited Assist Verbally Normal                       Personal Care Assistance Level of Assistance  Bathing, Feeding, Dressing Bathing Assistance: Limited assistance Feeding assistance: Independent Dressing Assistance: Limited assistance     Functional Limitations Info  Sight, Hearing, Speech Sight Info: Impaired Hearing Info: Adequate Speech Info: Adequate    SPECIAL CARE FACTORS FREQUENCY                       Contractures Contractures Info: Not present    Additional Factors Info  Code Status, Allergies Code Status Info: Full Allergies Info: No known allergies           Current Medications (11/03/2017):  This is the current hospital active medication list Current Facility-Administered Medications  Medication Dose Route Frequency Provider Last Rate Last Dose  . 0.9 %  sodium chloride infusion  250 mL Intravenous PRN Lady Deutscher, MD      . acetaminophen (TYLENOL) tablet 650 mg  650 mg Oral Q6H PRN Lady Deutscher,  MD       Or  . acetaminophen (TYLENOL) suppository 650 mg  650 mg Rectal Q6H PRN Lady Deutscher, MD      . amLODipine (NORVASC) tablet 10 mg  10 mg Oral QPM Lady Deutscher, MD   10 mg at 11/01/17 2211  . aspirin chewable tablet 81 mg  81 mg Oral Daily Lady Deutscher, MD   81 mg at 11/03/17 1100  . atorvastatin (LIPITOR) tablet 40 mg  40 mg Oral QHS Lady Deutscher, MD   40 mg at 11/02/17 2119  . clopidogrel (PLAVIX) tablet 75 mg  75 mg Oral Daily Lady Deutscher, MD   75 mg at 11/03/17 1101  . docusate sodium (COLACE) capsule 200 mg  200 mg Oral Daily Lady Deutscher, MD   200 mg at 11/03/17 1101  . feeding supplement (NEPRO CARB STEADY) liquid 237 mL  237 mL Oral BID BM Lady Deutscher, MD   237 mL at 11/03/17 1100  . feeding supplement (PRO-STAT SUGAR FREE 64) liquid 30 mL  30 mL Oral BID Lady Deutscher, MD   30 mL at 11/03/17 1101  . ferric citrate (AURYXIA) tablet 210 mg  210 mg Oral TID WC Lady Deutscher, MD   210 mg at 11/03/17 1102  . fluticasone (FLONASE) 50 MCG/ACT nasal spray 2 spray  2 spray Each Nare BID Lady Deutscher, MD   2 spray at 11/03/17 1100  . gabapentin (NEURONTIN) capsule 100 mg  100 mg Oral TID Lady Deutscher, MD   100 mg at 11/03/17 1102  . hydrALAZINE (APRESOLINE) tablet 25 mg  25 mg Oral TID Lady Deutscher, MD   25 mg at 11/03/17 1101  . isosorbide mononitrate (IMDUR) 24 hr tablet 30 mg  30 mg Oral Daily Lady Deutscher, MD   30 mg at 11/03/17 1100  . metoprolol tartrate (LOPRESSOR) tablet 25 mg  25 mg Oral BID Lady Deutscher, MD   25 mg at 11/03/17 1100  . multivitamin (RENA-VIT) tablet 1 tablet  1 tablet Oral QHS Lady Deutscher, MD   1 tablet at 11/02/17 2119  . omega-3 acid ethyl esters (LOVAZA) capsule 1 g  1 g Oral Daily Lady Deutscher, MD   1 g at 11/03/17 1101  . ondansetron (ZOFRAN) tablet 4 mg  4 mg Oral Q6H PRN Lady Deutscher, MD       Or  . ondansetron [facility]) injection 4 mg  4 mg Intravenous Q6H  PRN Lady Deutscher, MD      . oxyCODONE (Oxy IR/ROXICODONE) immediate release tablet 5 mg  5 mg Oral Q4H PRN Lady Deutscher, MD      . potassium chloride SA (K-DUR,KLOR-CON) CR tablet 20 mEq  20 mEq Oral Once Cristal Ford, DO      . sodium chloride flush (NS) 0.9 % injection 3 mL  3 mL Intravenous Q12H Lady Deutscher, MD   3 mL at 11/03/17 1105  . sodium chloride flush (NS) 0.9 % injection 3 mL  3 mL Intravenous PRN Lady Deutscher, MD  Discharge Medications: Please see discharge summary for a list of discharge medications.  Relevant Imaging Results:  Relevant Lab Results:   Additional Information [phone]. Dialysis patient MWF - [place], [place], [place]

## 2017-11-03 NOTE — Discharge Summary (Signed)
Physician Discharge Summary  Trevor [name] ERD:[id number] DOB: [birth year] DOA: 11/01/2017  PCP: [name]  Admit date: 11/01/2017 Discharge date: 11/03/2017  Time spent: 45 minutes  Recommendations for Outpatient Follow-up:  Patient will be discharged to [facility] facility.  Patient will need to follow up with primary care provider within one week of discharge, repeat BMP.  Patient should continue medications as prescribed.  Patient should follow a renal diet with 1200 fluid restriction per day.  Discharge Diagnoses:  Acute hypoxic respiratory failure/Pulmonary Edema secondary to missed hemodialysis  End-stage renal disease Nonischemic cardiomyopathy/coronary artery disease Essential hypertension Peripheral arterial disease Partial blindness/history of CVA Anemia of chronic disease  Discharge Condition: Stable  Diet recommendation: Renal with fluid restriction 126mL   Filed Weights   11/02/17 2124 11/03/17 0012 11/03/17 0513  Weight: 59.9 kg (132 lb 0.9 oz) 60.4 kg (133 lb 2.5 oz) 58.1 kg (128 lb 1.4 oz)    History of present illness:  On 11/01/2017 by Dr. 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 He was brought in via EMS who reported being called to the nursing home due to respiratory distress. They found that the patient had oxygen saturations of 50% on the arrival. He was placed on CPAP and sats began to increase to 80-90%. They state he  was trying to talk to them in route and he was gesticulating. He is unable to provide any useful history due to BiPAP and his history is obtained from the ER physician, the chart and records.  Hospital Course:  Acute hypoxic respiratory failure/Pulmonary Edema secondary to missed hemodialysis  -Upon arrival to the emergency department, SPO2 of 86% -nephrology consulted and appreciated -patient required urgent dialysis on 11/01/2017 -ordered repeat CXR: showed clearing of edema, currently no edema or consolidation -93% on room air  End-stage renal disease -dialyzes on M,W, F -nephrology consulted and appreciated  -next dialysis on Wednesday  Nonischemic cardiomyopathy/coronary artery disease -Currently stable, continue hydralazine, Imdur, Toprol, aspirin, Plavix, statin -Currently chest pain-free  Essential hypertension -Continue felodipine, hydralazine, Imdur, metoprolol  Peripheral arterial disease -Continues aspirin, Plavix  Partial blindness/history of CVA -Continue Plavix, aspirin, statin  Anemia of chronic disease -Currently stable -Continue iron replacement  Procedures: None  Consultations: Nephrology   Discharge Exam: Vitals:   11/03/17 0549 11/03/17 0953  BP: (!) 143/94 116/67  Pulse: 83 80  Resp: 19 18  Temp: 98.3 F (36.8 C) 98.2 F (36.8 C)  SpO2: 100% 100%   Patient feels his breathing has improved. Denies chest pain, abdominal pain, nausa, vomiting, diarrhea, constipation, dizziness.    General: Well developed, chronically ill appearing, NAD  HEENT: NCAT, mucous membranes moist.  Cardiovascular: S1 S2 auscultated, RRR, no murmurs  Respiratory: Clear to auscultation bilaterally with equal chest rise  Abdomen: Soft, nontender, nondistended, + bowel sounds  Extremities: warm dry without cyanosis clubbing or edema  Neuro: AAOx3, h/o partial blindess, otherwise nonfocal  Psych: Normal affect and demeanor  Discharge  Instructions Discharge Instructions    Discharge instructions   Complete by:  As directed    Patient [facility].  Patient will need to follow up with primary care provider within one week of discharge.  Patient should continue medications as prescribed.  Patient should follow a renal diet with 1200 fluid restriction per day.     Allergies as of 11/03/2017   No Known Allergies     Medication List    STOP taking these medications   sodium phosphate 7-19 GM/118ML Enem     TAKE these medications   amLODipine 10 MG tablet Commonly known as:  NORVASC Take 10 mg by mouth at bedtime.   aspirin 81 MG chewable tablet Chew 1 tablet (81 mg total) by mouth daily.   atorvastatin 40 MG tablet Commonly known as:  LIPITOR Take 40 mg by mouth at bedtime.   bisacodyl 10 MG suppository Commonly known as:  DULCOLAX Place 10 mg rectally daily as needed (constipation not relieved by MOM).   clopidogrel 75 MG tablet Commonly known as:  PLAVIX Take 1 tablet (75 mg total) by mouth daily. What changed:  when to take this   docusate sodium 100 MG capsule Commonly known as:  COLACE Take 200 mg by mouth at bedtime.   feeding supplement (NEPRO CARB STEADY) Liqd Take 237 mLs by mouth 2 (two) times daily between meals.   feeding supplement (PRO-STAT SUGAR FREE 64) Liqd Take 30 mLs by mouth 2 (two) times daily.   ferric citrate 1 GM 210 MG(Fe) tablet Commonly known as:  AURYXIA Take 630 mg by mouth 3 (three) times daily with meals.   Fish Oil 1000 MG Caps Take 1,000 mg by mouth 2 (two) times daily.   fluticasone 50 MCG/ACT nasal spray Commonly known as:  FLONASE Place 1 spray into both nostrils 2 (two) times daily.   gabapentin 100 MG capsule Commonly known as:  NEURONTIN Take 1 capsule (100 mg total) by mouth 3 (three) times daily.   hydrALAZINE 25 MG tablet Commonly known as:  APRESOLINE Take 25 mg by mouth 3 (three) times daily.    isosorbide mononitrate 30 MG 24 hr tablet Commonly known as:  IMDUR Take 30 mg by mouth daily.   metoprolol tartrate 25 MG tablet Commonly known as:  LOPRESSOR Take 1 tablet (25 mg total) by mouth 2 (two) times daily.   multivitamin Tabs tablet Take 1 tablet by mouth at bedtime.      No Known Allergies Follow-up Information    [facility] Schedule an appointment as soon as possible for a visit in 1 week(s).   Why:  Hospital follow up Contact information: [place] [address] [phone]            The results of significant diagnostics from this hospitalization (including imaging, microbiology, ancillary and laboratory) are listed below for reference.    Significant Diagnostic Studies: Dg Chest 2 View  Result Date: 11/03/2017 CLINICAL DATA:  Shortness of Breath EXAM: CHEST  2 VIEW COMPARISON:  November 01, 2017 FINDINGS: There has been interval clearing of interstitial edema compared to recent study. Currently no edema or consolidation is evident. The heart size and pulmonary vascular normal. No adenopathy. There is aortic atherosclerosis. A pacemaker regulator device is located over the left anterior chest, stable. No bone lesions evident. IMPRESSION: Interval clearing of edema. Currently no edema or consolidation. Heart size within normal limits. There is aortic atherosclerosis. Aortic Atherosclerosis (ICD10-I70.0). Electronically Signed   By: Lowella Grip III M.D.   On: 11/03/2017 10:59   Dg Chest [facility]  Result Date: 11/01/2017 CLINICAL DATA:  Shortness of Breath EXAM: PORTABLE CHEST 1 VIEW COMPARISON:  06/28/2017 FINDINGS: Severe diffuse interstitial opacities throughout the lungs, worsened since prior study. Heart is borderline in size. Possible small bilateral effusions. No acute bony abnormality. IMPRESSION: Severe diffuse interstitial disease which could reflect interstitial edema or interstitial pneumonia.  Electronically Signed   By: Rolm Baptise M.D.   On: 11/01/2017 11:59    Microbiology: Recent Results (from the past 240 hour(s))  MRSA PCR Screening     Status: None   Collection Time: 11/01/17  9:38 PM  Result Value Ref Range Status   MRSA by PCR NEGATIVE NEGATIVE Final    Comment:        The GeneXpert MRSA Assay (FDA approved for NASAL specimens only), is one component of a comprehensive MRSA colonization surveillance program. It is not intended to diagnose MRSA infection nor to guide or monitor treatment for MRSA infections.      Labs: Basic Metabolic Panel: Recent Labs  Lab 11/01/17 1130 11/02/17 0626 11/03/17 0808  NA 136 136 136  K 5.0 4.4 3.4*  CL 99* 98* 96*  CO2 21* 25 26  GLUCOSE 123* 84 91  BUN 81* 45* 23*  CREATININE 11.57* 7.80* 4.77*  CALCIUM 9.5 9.8 9.4   Liver Function Tests: Recent Labs  Lab 11/01/17 1130  AST 18  ALT 13*  ALKPHOS 106  BILITOT 1.3*  PROT 7.5  ALBUMIN 3.2*   No results for input(s): LIPASE, AMYLASE in the last 168 hours. No results for input(s): AMMONIA in the last 168 hours. CBC: Recent Labs  Lab 11/01/17 1130 11/02/17 0626 11/03/17 0808  WBC 10.7* 6.6 6.8  NEUTROABS 8.9*  --   --   HGB 11.8* 10.8* 11.2*  HCT 34.7* 31.7* 33.2*  MCV 86.5 85.0 86.0  PLT 194 201 212   Cardiac Enzymes: No results for input(s): CKTOTAL, CKMB, CKMBINDEX, TROPONINI in the last 168 hours. BNP: BNP (last 3 results) Recent Labs    01/25/17 0059 04/04/17 1720 06/27/17 0550  BNP 1,608.5* 1,151.0* >4,500.0*    ProBNP (last 3 results) No results for input(s): PROBNP in the last 8760 hours.  CBG: No results for input(s): GLUCAP in the last 168 hours.     Signed:  Cristal Ford  [facility] 11/03/2017, 1:13 PM

## 2017-11-03 NOTE — Progress Notes (Signed)
Subjective:  Tolerated hd last night  , no cos , going for cxr  Now   Objective Vital signs in last 24 hours: Vitals:   11/03/17 0400 11/03/17 0513 11/03/17 0549 11/03/17 0953  BP: (!) 89/50 (!) 150/70 (!) 143/94 116/67  Pulse: (!) 55 84 83 80  Resp: (!) 21 19 19 18   Temp:  97.9 F (36.6 C) 98.3 F (36.8 C) 98.2 F (36.8 C)  TempSrc:  Oral Oral Oral  SpO2: 100% 100% 100% 100%  Weight:  58.1 kg (128 lb 1.4 oz)    Height:       Weight change: 0.2 kg (7.1 oz)  Physical Exam: General: Alert thin AAM  NAD , OX3 Heart: RRR  No mrg Lungs CTA  bilat , no rales  Abdomen: soft Nt, ND  Extremities:  No pedal edema  Dialysis Access: Pos bruit  LUA AVF   Dialysis: [place] MWF  4h 60kg 2/2 bath LUA AVF Hep none -venofer 50/wk -mircera 50 due 12/3 -hect 4 ug tiw -home BP: norvasc 10/ hydral 25 tid/ metop 25 bid   Problem/Plan: 1.  Resp distress/ pulm edema - due to missed HD and vol overload, HD post wt to 58 last night lower edw at dc  (cxr  per admit team  fu pulm edema ) 2. ESRD- usual HD MWF,  3. HTN - Admit BPs up  Improved with hd / on Amlodipine 10 mg/ hydralazine 25 mg tid  . 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 NICM/ hx CAD 7. Partially blind 8. Hx CVA = lives at [facility]  9. hx axillofemoral and fem-fem bypass in August   Ernest Haber, [facility] Beeper [phone] 11/03/2017,10:22 AM  LOS: 2 days   Labs: Basic Metabolic Panel: Recent Labs  Lab 11/01/17 1130 11/02/17 0626 11/03/17 0808  NA 136 136 136  K 5.0 4.4 3.4*  CL 99* 98* 96*  CO2 21* 25 26  GLUCOSE 123* 84 91  BUN 81* 45* 23*  CREATININE 11.57* 7.80* 4.77*  CALCIUM 9.5 9.8 9.4   Liver Function Tests: Recent Labs  Lab 11/01/17 1130  AST 18  ALT 13*  ALKPHOS 106  BILITOT 1.3*  PROT 7.5  ALBUMIN 3.2*   No results for input(s): LIPASE, AMYLASE in the last 168 hours. No results for input(s): AMMONIA in the  last 168 hours. CBC: Recent Labs  Lab 11/01/17 1130 11/02/17 0626 11/03/17 0808  WBC 10.7* 6.6 6.8  NEUTROABS 8.9*  --   --   HGB 11.8* 10.8* 11.2*  HCT 34.7* 31.7* 33.2*  MCV 86.5 85.0 86.0  PLT 194 201 212   Cardiac Enzymes: No results for input(s): CKTOTAL, CKMB, CKMBINDEX, TROPONINI in the last 168 hours. CBG: No results for input(s): GLUCAP in the last 168 hours.  Studies/Results: Dg Chest Port 1 View  Result Date: 11/01/2017 CLINICAL DATA:  Shortness of Breath EXAM: PORTABLE CHEST 1 VIEW COMPARISON:  06/28/2017 FINDINGS: Severe diffuse interstitial opacities throughout the lungs, worsened since prior study. Heart is borderline in size. Possible small bilateral effusions. No acute bony abnormality. IMPRESSION: Severe diffuse interstitial disease which could reflect interstitial edema or interstitial pneumonia. Electronically Signed   By: Rolm Baptise M.D.   On: 11/01/2017 11:59   Medications: . sodium chloride     . amLODipine  10 mg Oral QPM  . aspirin  81 mg Oral Daily  .  atorvastatin  40 mg Oral QHS  . clopidogrel  75 mg Oral Daily  . docusate sodium  200 mg Oral Daily  . feeding supplement (NEPRO CARB STEADY)  237 mL Oral BID BM  . feeding supplement (PRO-STAT SUGAR FREE 64)  30 mL Oral BID  . ferric citrate  210 mg Oral TID WC  . fluticasone  2 spray Each Nare BID  . gabapentin  100 mg Oral TID  . hydrALAZINE  25 mg Oral TID  . isosorbide mononitrate  30 mg Oral Daily  . metoprolol tartrate  25 mg Oral BID  . multivitamin  1 tablet Oral QHS  . omega-3 acid ethyl esters  1 g Oral Daily  . sodium chloride flush  3 mL Intravenous Q12H

## 2017-11-03 NOTE — Clinical Social Work Note (Signed)
Clinical Social Work Assessment  Patient Details  Name: Trevor [name] MRN: [id number] Date of Birth: October 18, 1946  Date of referral:  11/03/17               Reason for consult:  Facility Placement                Permission sought to share information with:  Family Supports Permission granted to share information::  Yes, Verbal Permission Granted  Name::     [name] Nugent or Coral Spikes  Agency::     Relationship::  Daughter and Son  Sport and exercise psychologist Information:  [phone] - daughter and [phone] - son  Housing/Transportation Living arrangements for the past 2 months:  Skilled Nursing Facility(Patient admitted to [facility] 07/02/17) Source of Information:  Patient, Adult Children, Other (Comment Required)(Reviewed chart) Patient Interpreter Needed:  None Criminal Activity/Legal Involvement Pertinent to Current Situation/Hospitalization:  No - Comment as needed Significant Relationships:  Adult Children Lives with:  Facility Resident([facility]) Do you feel safe going back to the place where you live?  Yes Need for family participation in patient care:  Yes (Comment)  Care giving concerns:  Patient expressed no concerns regarding the care he receives at [facility].  Social Worker assessment / plan:  CSW talked with patient at the bedside regarding his discharged disposition. Mr. Dewoody was lying in bed and was awake, alert and open to talking with CSW. Patient indicated that he haas been at [facility] about a month and 1/2 and when asked, did not express any concerns regarding the care he receives there.  CSW was given permission to contact his daughter Maree Erie and she was unaware that her father was in the hospital. When asked, Maree Erie was agreeable to transporting patient from hospital back to [place]. Mr. Doolan reported that his son Truman Hayward is aware that he is in the hospital, but he does not have his phone with him and does not know his children's numbers from memory. Patient added that  he is not surprised that his son had not called [name].    Employment status:  Retired Forensic scientist:  Medicare PT Recommendations:  Not assessed at this time [place] / Referral to community resources:  Other (Comment Required)(None needed or requested as patient from [name])  Patient/Family's Response to care:  Patient did not express any concerns regarding his care during hospitalization.  Patient/Family's Understanding of and Emotional Response to Diagnosis, Current Treatment, and Prognosis:  Daughter expressed surprise that he dad was in the hospital. Patient did not discuss his health issues or why he came to the hospital.  Emotional Assessment Appearance:  Appears stated age Attitude/Demeanor/Rapport:  Other(Appropriate) Affect (typically observed):  Appropriate, Pleasant Orientation:  Oriented to Self, Oriented to Situation, Oriented to Place, Oriented to  Time Alcohol / Substance use:  Tobacco Use, Alcohol Use, Illicit Drugs(Patient reported that he quit smoking and does not drink or use illicit drugs) Psych involvement (Current and /or in the community):  No (Comment)  Discharge Needs  Concerns to be addressed:  Discharge Planning Concerns Readmission within the last 30 days:  No Current discharge risk:  None Barriers to Discharge:  No Barriers Identified   Sable Feil, LCSW 11/03/2017, 1:25 PM

## 2017-11-03 NOTE — Progress Notes (Signed)
Report given to Kyung Rudd at [facility] and rehab. Pt daughter to transport pt to facility.

## 2017-11-03 NOTE — Progress Notes (Signed)
HD tx initiated via 15Gx2 w/o problem, pull/push/flush equally w/o problem, VSS, will cont to monitor while on HD tx 

## 2017-11-03 NOTE — Progress Notes (Signed)
HD tx completed @ 0400 w/ bp issues throughout tx. bp would be high then bottom out, UF goal not met, blood rinsed back, increased bleeding time for a total of 65 mins for both sites (ven 10 min and art 55 mins) couldn't get arterial site to quit bleeding. Report called to Vinie Sill, RN

## 2017-11-03 NOTE — Clinical Social Work Note (Signed)
Patient medically stable for discharge and will be transported back to [facility] by daughter Maree Erie. CSW talked with patient at the bedside and his daughter Maree Erie by phone regarding discharge. Admissions director-Rhonda at [facility] informed of discharge and discharge clinicals transmitted to facility. CSW signing off, however please reconsult if any other SW intervention services needed prior to discharge.  Chanell Nadeau Givens, MSW, LCSW Licensed Clinical Social Worker [place] [phone]

## 2017-11-03 NOTE — Discharge Instructions (Signed)
Pulmonary Edema °Pulmonary edema is abnormal fluid buildup in the lungs that can make it hard to breathe. °Follow these instructions at home: °· Talk to your doctor about an exercise program. °· Eat a healthy diet: °? Eat fresh fruits, vegetables, and lean meats. °? Limit high fat and salty foods. °? Avoid processed, canned, or fried foods. °? Avoid fast food. °· Follow your doctor's advice about taking medicine and recording the medicine you take. °· Follow your doctor's advice about keeping a record of your weight. °· Talk to your doctor about keeping track of your blood pressure. °· Do not smoke. °· Do not use nicotine patches or nicotine gum. °· Make a follow-up appointment with your doctor. °· Ask your doctor for a copy of your latest heart tracing (ECG) and keep a copy with you at all times. °Get help right away if: °· You have chest pain. THIS IS AN EMERGENCY. Do not wait to see if the pain will go away. Call for local emergency medical help. Do not drive yourself to the hospital. °· You have sweating, feel sick to your stomach (nauseous), or are experiencing shortness of breath. °· Your weight increases more than your doctor tells you it should. °· You start to have shortness of breath. °· You notice more swelling in your hands, feet, ankles, or belly. °· You have dizziness, blurred vision, headache, or unsteadiness that does not go away. °· You cough up bloody spit. °· You have a cough that does not go away. °· You are unable to sleep because it is hard to breathe. °· You begin to feel a “jumping” or “fluttering” sensation (palpitations) in the chest that is unusual for you. °This information is not intended to replace advice given to you by your health care provider. Make sure you discuss any questions you have with your health care provider. °Document Released: 11/05/2009 Document Revised: 04/24/2016 Document Reviewed: 07/25/2013 °Elsevier Interactive Patient Education © 2018 Elsevier Inc. ° °

## 2017-11-05 ENCOUNTER — Encounter: Payer: Self-pay | Admitting: Adult Health

## 2017-11-05 DIAGNOSIS — I2510 Atherosclerotic heart disease of native coronary artery without angina pectoris: Secondary | ICD-10-CM | POA: Diagnosis not present

## 2017-11-05 DIAGNOSIS — M6281 Muscle weakness (generalized): Secondary | ICD-10-CM | POA: Diagnosis not present

## 2017-11-05 DIAGNOSIS — N186 End stage renal disease: Secondary | ICD-10-CM | POA: Diagnosis not present

## 2017-11-05 DIAGNOSIS — R262 Difficulty in walking, not elsewhere classified: Secondary | ICD-10-CM | POA: Diagnosis not present

## 2017-11-05 NOTE — Progress Notes (Deleted)
Location:  [place] Room Number: 128-B Place of Service:  [name] (31) Provider:  Durenda Age, NP  Patient Care Team: Clinic, Trevor [name] as PCP - General Stanford Breed Trevor Bors, MD (Cardiology)  Extended Emergency Contact Information Primary Emergency Contact: Trevor [name] States of [place] Work Phone: [phone] Mobile Phone: [phone] Relation: Daughter Secondary Emergency Contact: Trevor [name] Phone: [phone] Relation: Son  Code Status:  Full Code Goals of care: Advanced Directive information Advanced Directives 11/01/2017  Does Patient Have a Medical Advance Directive? No  Type of Advance Directive -  Does patient want to make changes to medical advance directive? -  Copy of [place] in Chart? -  Would patient like information on creating a medical advance directive? No - Patient declined  Pre-existing out of facility DNR order (yellow form or pink MOST form) -     Chief Complaint  Patient presents with  . Acute Visit    Seen for followup of hospitalization at [facility] 12/2-12/4/18 for pulmonary edema    HPI:  Pt is a 71 y.o. male seen today for an acute visit for followup on his hospitalization at [facility] 12/2-12/4/18 for shortness of breath.  He was readmitted to [facility] on 12/4, where he is a long-term care resident.  He has a PMH of ESRD on hemodialysis, stroke, hepatitis C, HLD, HTN, and is partially blind.    Past Medical History:  Diagnosis Date  . AAA (abdominal aortic aneurysm) ([place])   . Anxiety   . Arthritis   . Blindness   . CHF (congestive heart failure) ([place])   . 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 ESRD on hemodialysis ([place])    started HD 2014-15  . Headache(784.0)   . HTN (hypertension)   . Protein-calorie malnutrition ([place])    Past Surgical History:  Procedure Laterality Date  .  AXILLARY-FEMORAL BYPASS GRAFT  06/28/2017   Procedure: BYPASS GRAFT RIGHT AXILLA-BIFEMORAL USING 8MM X 30CM AND 8MM X 60CM HEMASHIELD GOLD GRAFTS;  Surgeon: Rosetta Posner, MD;  Location: [place];  Service: Vascular;;  . CARDIAC CATHETERIZATION N/A 11/01/2016   Procedure: Left Heart Cath and Coronary Angiography;  Surgeon: Lorretta Harp, MD;  Location: [place] CV LAB;  Service: Cardiovascular;  Laterality: N/A;  . ENDARTERECTOMY FEMORAL Bilateral 06/28/2017   Procedure: ENDARTERECTOMY BILATERAL FEMORAL ARTERIES;  Surgeon: Rosetta Posner, MD;  Location: [place];  Service: Vascular;  Laterality: Bilateral;  . INSERTION OF DIALYSIS CATHETER  09/21/2012   Procedure: INSERTION OF DIALYSIS CATHETER;  Surgeon: Angelia Mould, MD;  Location: [place];  Service: Vascular;  Laterality: N/A;  Right Internal Jugular Placement  . LEFT HEART CATHETERIZATION WITH CORONARY ANGIOGRAM N/A 09/28/2012   Procedure: LEFT HEART CATHETERIZATION WITH CORONARY ANGIOGRAM;  Surgeon: Sherren Mocha, MD;  Location: [facility] CATH LAB;  Service: Cardiovascular;  Laterality: N/A;  . LEFT HEART CATHETERIZATION WITH CORONARY ANGIOGRAM N/A 07/07/2014   Procedure: LEFT HEART CATHETERIZATION WITH CORONARY ANGIOGRAM;  Surgeon: Leonie Man, MD;  Location: [facility] CATH LAB;  Service: Cardiovascular;  Laterality: N/A;  . right hand    . TEE WITHOUT CARDIOVERSION  10/09/2011   Procedure: TRANSESOPHAGEAL ECHOCARDIOGRAM (TEE);  Surgeon: Lelon Perla, MD;  Location: [facility] ENDOSCOPY;  Service: Cardiovascular;  Laterality: N/A;    No Known Allergies  Outpatient Encounter Medications as of 11/05/2017  Medication Sig  . Amino  Acids-Protein Hydrolys (FEEDING SUPPLEMENT, PRO-STAT SUGAR FREE 64,) LIQD Take 30 mLs by mouth 2 (two) times daily.  Marland Kitchen amLODipine (NORVASC) 10 MG tablet Take 10 mg by mouth at bedtime.   Marland Kitchen aspirin 81 MG chewable tablet Chew 1 tablet (81 mg total) by mouth daily.  Marland Kitchen atorvastatin (LIPITOR) 40 MG tablet Take 40 mg by mouth at  bedtime.  . bisacodyl (DULCOLAX) 10 MG suppository Place 10 mg rectally daily as needed (constipation not relieved by MOM).  . clopidogrel (PLAVIX) 75 MG tablet Take 1 tablet (75 mg total) by mouth daily.  Marland Kitchen docusate sodium (COLACE) 100 MG capsule Take 200 mg by mouth at bedtime.   . ferric citrate (AURYXIA) 1 GM 210 MG(Fe) tablet Take 630 mg by mouth 3 (three) times daily with meals.   . fluticasone (FLONASE) 50 MCG/ACT nasal spray Place 1 spray into both nostrils 2 (two) times daily.   Marland Kitchen gabapentin (NEURONTIN) 100 MG capsule Take 1 capsule (100 mg total) by mouth 3 (three) times daily.  . hydrALAZINE (APRESOLINE) 25 MG tablet Take 25 mg by mouth 3 (three) times daily.  . isosorbide mononitrate (IMDUR) 30 MG 24 hr tablet Take 30 mg by mouth daily.  . metoprolol tartrate (LOPRESSOR) 25 MG tablet Take 1 tablet (25 mg total) by mouth 2 (two) times daily.  . multivitamin (RENA-VIT) TABS tablet Take 1 tablet by mouth at bedtime.  . Nutritional Supplements (FEEDING SUPPLEMENT, NEPRO CARB STEADY,) LIQD Take 237 mLs by mouth 2 (two) times daily between meals.  . Omega-3 Fatty Acids (FISH OIL) 1000 MG CAPS Take 1,000 mg by mouth 2 (two) times daily.   No facility-administered encounter medications on file as of 11/05/2017.     Review of Systems  GENERAL: No change in appetite, no fatigue, no weight changes, no fever, chills or weakness SKIN: Denies rash, itching, wounds, ulcer sores, or nail abnormalities EYES: Denies change in vision, dry eyes, eye pain, itching or discharge EARS: Denies change in hearing, ringing in ears, or earache NOSE: Denies nasal congestion or epistaxis MOUTH and THROAT: Denies oral discomfort, gingival pain or bleeding, pain from teeth or hoarseness   RESPIRATORY: no cough, SOB, DOE, wheezing, hemoptysis CARDIAC: No chest pain, edema or palpitations GI: No abdominal pain, diarrhea, constipation, heart burn, nausea or vomiting GU: Denies dysuria, frequency, hematuria,  incontinence, or discharge MUSCULOSKELETAL: Denies joint pain, muscle pain, back pain, restricted movement, or unusual weakness CIRCULATION: Denies claudication, edema of legs, varicosities, or cold extremities NEUROLOGICAL: Denies dizziness, syncope, numbness, or headache PSYCHIATRIC: Denies feelings of depression or anxiety. No report of hallucinations, insomnia, paranoia, or agitation ENDOCRINE: Denies polyphagia, polyuria, polydipsia, heat or cold intolerance HEME/LYMPH: Denies excessive bruising, petechia, enlarged lymph nodes, or bleeding problems IMMUNOLOGIC: Denies history of frequent infections, AIDS, or use of immunosuppressive agents   Immunization History  Administered Date(s) Administered  . Influenza,inj,Quad PF,6+ Mos 08/14/2015  . Influenza-Unspecified 08/31/2016  . PPD Test 01/17/2016, 07/02/2017  . Pneumococcal Polysaccharide-23 08/14/2015  . Tdap 10/12/2017   Pertinent  Health Maintenance Due  Topic Date Due  . INFLUENZA VACCINE  02/28/2018 (Originally 07/01/2017)  . PNA vac Low Risk Adult (2 of 2 - PCV13) 08/13/2018 (Originally 08/13/2016)  . COLONOSCOPY  08/08/2027 (Originally 11/06/1995)   Fall Risk  09/29/2017  Falls in the past year? Yes  Number falls in past yr: 2 or more  Injury with Fall? No      Vitals:   11/05/17 1453  BP: 116/67  Pulse: 72  Resp: 20  Temp: (!) 96.8 F (36 C)  TempSrc: Oral  SpO2: 98%  Weight: 128 lb 1.4 oz (58.1 kg)  Height: 5\' 10"  (1.778 m)   Body mass index is 18.38 kg/m.  Physical Exam  GENERAL APPEARANCE: Well nourished. In no acute distress. Normal body habitus SKIN:  Skin is warm and dry. There are no suspicious lesions or rash HEAD: Normal in size and contour. No evidence of trauma EYES: Lids open and close normally. No blepharitis, entropion or ectropion. PERRL. Conjunctivae are clear and sclerae are white. Lenses are without opacity EARS: Pinnae are normal. Patient hears normal voice tunes of the examiner MOUTH  and THROAT: Lips are without lesions. Oral mucosa is moist and without lesions. Tongue is normal in shape, size, and color and without lesions NECK: supple, trachea midline, no neck masses, no thyroid tenderness, no thyromegaly LYMPHATICS: No LAN in the neck, no supraclavicular LAN RESPIRATORY: Breathing is even & unlabored, BS CTAB CARDIAC: RRR, no murmur,no extra heart sounds, no edema GI: Abdomen soft, normal BS, no masses, no tenderness, no hepatomegaly, no splenomegaly MUSCULOSKELETAL: No deformities. Movement at each extremity is full and painless. Strength is 5/5 at each extremity. Back is without kyphosis or scoliosis CIRCULATION: Pedal pulses are 2+. There is no edema of the legs, ankles and feet NEUROLOGICAL: There is no tremor. Speech is clear PSYCHIATRIC: Alert and oriented X 3. Affect and behavior are appropriate  Labs reviewed: Recent Labs    12/08/16 1504 12/10/16 1834  11/01/17 1130 11/02/17 0626 11/03/17 0808  NA 142 134*   < > 136 136 136  K 4.5 2.9*   < > 5.0 4.4 3.4*  CL 104 98*   < > 99* 98* 96*  CO2 28 27   < > 21* 25 26  GLUCOSE 103* 89   < > 123* 84 91  BUN 37* 9   < > 81* 45* 23*  CREATININE 10.46* 3.00*   < > 11.57* 7.80* 4.77*  CALCIUM 9.1 8.8*   < > 9.5 9.8 9.4  PHOS 4.2 1.5*  --   --   --   --    < > = values in this interval not displayed.   Recent Labs    06/28/17 1125 06/29/17 0439 11/01/17 1130  AST 16 37 18  ALT 8* 13* 13*  ALKPHOS 73 61 106  BILITOT 0.9 0.8 1.3*  PROT 6.9 5.9* 7.5  ALBUMIN 3.5 3.0* 3.2*   Recent Labs    08/08/17 09/14/17 1846  11/01/17 1130 11/02/17 0626 11/03/17 0808  WBC 6.4 5.4  --  10.7* 6.6 6.8  NEUTROABS 3 2.9  --  8.9*  --   --   HGB 10.8* 11.7*   < > 11.8* 10.8* 11.2*  HCT 33* 34.0*   < > 34.7* 31.7* 33.2*  MCV  --  84.8  --  86.5 85.0 86.0  PLT 141* 196  --  194 201 212   < > = values in this interval not displayed.   Lab Results  Component Value Date   TSH 1.685 01/25/2017   Lab Results    Component Value Date   HGBA1C 4.4 07/31/2017   Lab Results  Component Value Date   CHOL 135 01/24/2017   HDL 37 (L) 01/24/2017   LDLCALC 74 01/24/2017   TRIG 119 01/24/2017   CHOLHDL 3.6 01/24/2017    Significant Diagnostic Results in last 30 days:  Dg Chest 2 View  Result Date: 11/03/2017 CLINICAL DATA:  Shortness  of Breath EXAM: CHEST  2 VIEW COMPARISON:  November 01, 2017 FINDINGS: There has been interval clearing of interstitial edema compared to recent study. Currently no edema or consolidation is evident. The heart size and pulmonary vascular normal. No adenopathy. There is aortic atherosclerosis. A pacemaker regulator device is located over the left anterior chest, stable. No bone lesions evident. IMPRESSION: Interval clearing of edema. Currently no edema or consolidation. Heart size within normal limits. There is aortic atherosclerosis. Aortic Atherosclerosis (ICD10-I70.0). Electronically Signed   By: Lowella Grip III M.D.   On: 11/03/2017 10:59   Dg Chest Port 1 View  Result Date: 11/01/2017 CLINICAL DATA:  Shortness of Breath EXAM: PORTABLE CHEST 1 VIEW COMPARISON:  06/28/2017 FINDINGS: Severe diffuse interstitial opacities throughout the lungs, worsened since prior study. Heart is borderline in size. Possible small bilateral effusions. No acute bony abnormality. IMPRESSION: Severe diffuse interstitial disease which could reflect interstitial edema or interstitial pneumonia. Electronically Signed   By: Rolm Baptise M.D.   On: 11/01/2017 11:59    Assessment/Plan ***   Family/ staff Communication: ***  Labs/tests ordered:  Durenda Age, NP [facility] [phone] (Monday-Friday 8:00 a.m. - 5:00 p.m.) [phone] (after hours) This encounter was created in error - please disregard. This encounter was created in error - please disregard. This encounter was created in error - please disregard. This encounter was created in  error - please disregard. This encounter was created in error - please disregard.

## 2017-11-06 ENCOUNTER — Encounter: Payer: Self-pay | Admitting: Adult Health

## 2017-11-06 ENCOUNTER — Non-Acute Institutional Stay (SKILLED_NURSING_FACILITY): Payer: Medicare Other | Admitting: Adult Health

## 2017-11-06 DIAGNOSIS — I2510 Atherosclerotic heart disease of native coronary artery without angina pectoris: Secondary | ICD-10-CM | POA: Diagnosis not present

## 2017-11-06 DIAGNOSIS — Z8673 Personal history of transient ischemic attack (TIA), and cerebral infarction without residual deficits: Secondary | ICD-10-CM | POA: Diagnosis not present

## 2017-11-06 DIAGNOSIS — N186 End stage renal disease: Secondary | ICD-10-CM

## 2017-11-06 DIAGNOSIS — Z992 Dependence on renal dialysis: Secondary | ICD-10-CM | POA: Diagnosis not present

## 2017-11-06 DIAGNOSIS — D638 Anemia in other chronic diseases classified elsewhere: Secondary | ICD-10-CM

## 2017-11-06 DIAGNOSIS — J810 Acute pulmonary edema: Secondary | ICD-10-CM | POA: Diagnosis not present

## 2017-11-06 DIAGNOSIS — G629 Polyneuropathy, unspecified: Secondary | ICD-10-CM

## 2017-11-06 DIAGNOSIS — I10 Essential (primary) hypertension: Secondary | ICD-10-CM

## 2017-11-06 DIAGNOSIS — J310 Chronic rhinitis: Secondary | ICD-10-CM | POA: Diagnosis not present

## 2017-11-06 NOTE — Progress Notes (Signed)
This encounter was created in error - please disregard.

## 2017-11-06 NOTE — Progress Notes (Signed)
Location:  [place] Room Number: 06/22/1946 Place of Service:  [name] (31) Provider:  Durenda Age, NP  Patient Care Team: [name] as PCP - General Stanford Breed [name], MD (Cardiology)  Extended Emergency Contact Information Primary Emergency Contact: [name] Work Phone: [phone] Mobile Phone: [phone] Relation: Daughter Secondary Emergency Contact: [place] Phone: [phone] Relation: Son  Code Status:  Full Code  Goals of care: Advanced Directive information Advanced Directives 11/01/2017  Does Patient Have a Medical Advance Directive? No  Type of Advance Directive -  Does patient want to make changes to medical advance directive? -  Copy of [place] in Chart? -  Would patient like information on creating a medical advance directive? No - Patient declined  Pre-existing out of facility DNR order (yellow form or pink MOST form) -     Chief Complaint  Patient presents with  . Acute Visit    Hospital followup, inpatient at [facility] 12/2-12/4/18 for pulmonary edema    HPI:  Pt is a 71 y.o. male seen today for followup of his hospital admission at [facility] 12/2-12/4/18 for pulmonary edema. He has missed a dialysis session. After 2 days, he started to have respiratory distress with O2 sat at 50%. He was put on CPAP and sats began to increase to 80-90%. He had dialysis on 11/01/17. Chest x-ray showed clearing of edema. He is a long-term care resident of [facility].  He was readmitted to the facility on 11/03/17.  He has a PMH of ESRD on hemodialysis, stroke, hepatitis C. 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     Past Medical History:  Diagnosis Date  . AAA (abdominal aortic aneurysm) ([place])   . Anxiety   . Arthritis   . Blindness   . CHF (congestive heart failure) ([place])   . CVA (cerebral  infarction)    caused blindness, 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  . ESRD on hemodialysis ([place])    started HD 2014-15  . Headache(784.0)   . HTN (hypertension)   . Protein-calorie malnutrition ([place])    Past Surgical History:  Procedure Laterality Date  . AXILLARY-FEMORAL BYPASS GRAFT  06/28/2017   Procedure: BYPASS GRAFT RIGHT AXILLA-BIFEMORAL USING 8MM X 30CM AND 8MM X 60CM HEMASHIELD GOLD GRAFTS;  Surgeon: Rosetta Posner, MD;  Location: [place];  Service: Vascular;;  . CARDIAC CATHETERIZATION N/A 11/01/2016   Procedure: Left Heart Cath and Coronary Angiography;  Surgeon: Lorretta Harp, MD;  Location: [place] CV LAB;  Service: Cardiovascular;  Laterality: N/A;  . ENDARTERECTOMY FEMORAL Bilateral 06/28/2017   Procedure: ENDARTERECTOMY BILATERAL FEMORAL ARTERIES;  Surgeon: Rosetta Posner, MD;  Location: [place];  Service: Vascular;  Laterality: Bilateral;  . INSERTION OF DIALYSIS CATHETER  09/21/2012   Procedure: INSERTION OF DIALYSIS CATHETER;  Surgeon: Angelia Mould, MD;  Location: [place];  Service: Vascular;  Laterality: N/A;  Right Internal Jugular Placement  . LEFT HEART CATHETERIZATION WITH CORONARY ANGIOGRAM N/A 09/28/2012   Procedure: LEFT HEART CATHETERIZATION WITH CORONARY ANGIOGRAM;  Surgeon: Sherren Mocha, MD;  Location: [facility] CATH LAB;  Service: Cardiovascular;  Laterality: N/A;  . LEFT HEART CATHETERIZATION WITH CORONARY ANGIOGRAM N/A 07/07/2014   Procedure: LEFT HEART CATHETERIZATION WITH CORONARY ANGIOGRAM;  Surgeon: Leonie Man, MD;  Location: [facility] CATH LAB;  Service: Cardiovascular;  Laterality: N/A;  . right hand    .  TEE WITHOUT CARDIOVERSION  10/09/2011   Procedure: TRANSESOPHAGEAL ECHOCARDIOGRAM (TEE);  Surgeon: Lelon Perla, MD;  Location: [facility] ENDOSCOPY;  Service: Cardiovascular;  Laterality: N/A;    No Known Allergies  Outpatient Encounter Medications as of 11/06/2017  Medication Sig  . Amino Acids-Protein Hydrolys (FEEDING SUPPLEMENT, PRO-STAT SUGAR FREE  64,) LIQD Take 30 mLs by mouth 2 (two) times daily.  Marland Kitchen amLODipine (NORVASC) 10 MG tablet Take 10 mg by mouth at bedtime.   Marland Kitchen aspirin 81 MG chewable tablet Chew 1 tablet (81 mg total) by mouth daily.  Marland Kitchen atorvastatin (LIPITOR) 40 MG tablet Take 40 mg by mouth at bedtime.  . bisacodyl (DULCOLAX) 10 MG suppository Place 10 mg rectally daily as needed (constipation not relieved by MOM).  . clopidogrel (PLAVIX) 75 MG tablet Take 1 tablet (75 mg total) by mouth daily.  Marland Kitchen docusate sodium (COLACE) 100 MG capsule Take 200 mg by mouth at bedtime.   . ferric citrate (AURYXIA) 1 GM 210 MG(Fe) tablet Take 630 mg by mouth 3 (three) times daily with meals.   . fluticasone (FLONASE) 50 MCG/ACT nasal spray Place 1 spray into both nostrils 2 (two) times daily.   Marland Kitchen gabapentin (NEURONTIN) 100 MG capsule Take 1 capsule (100 mg total) by mouth 3 (three) times daily.  . hydrALAZINE (APRESOLINE) 25 MG tablet Take 25 mg by mouth 3 (three) times daily.  . isosorbide mononitrate (IMDUR) 30 MG 24 hr tablet Take 30 mg by mouth daily.  . metoprolol tartrate (LOPRESSOR) 25 MG tablet Take 1 tablet (25 mg total) by mouth 2 (two) times daily.  . multivitamin (RENA-VIT) TABS tablet Take 1 tablet by mouth at bedtime.  . Nutritional Supplements (FEEDING SUPPLEMENT, NEPRO CARB STEADY,) LIQD Take 237 mLs by mouth 2 (two) times daily between meals.  . Omega-3 Fatty Acids (FISH OIL) 1000 MG CAPS Take 1,000 mg by mouth 2 (two) times daily.   No facility-administered encounter medications on file as of 11/06/2017.     Review of Systems  GENERAL: No change in appetite, no fatigue, no weight changes, no fever, chills or weakness MOUTH and THROAT: Denies oral discomfort, gingival pain  RESPIRATORY: no cough, SOB, DOE, wheezing, hemoptysis CARDIAC: No chest pain, edema or palpitations GI: No abdominal pain, diarrhea, constipation, heart burn, nausea or vomiting GU: Denies dysuria, frequency, hematuria, incontinence, or  discharge PSYCHIATRIC: Denies feelings of depression or anxiety. No report of hallucinations, insomnia, paranoia, or agitation    Immunization History  Administered Date(s) Administered  . Influenza,inj,Quad PF,6+ Mos 08/14/2015  . Influenza-Unspecified 08/31/2016  . PPD Test 01/17/2016, 07/02/2017  . Pneumococcal Polysaccharide-23 08/14/2015  . Tdap 10/12/2017   Pertinent  Health Maintenance Due  Topic Date Due  . INFLUENZA VACCINE  02/28/2018 (Originally 07/01/2017)  . PNA vac Low Risk Adult (2 of 2 - PCV13) 08/13/2018 (Originally 08/13/2016)  . COLONOSCOPY  08/08/2027 (Originally 11/06/1995)   Fall Risk  09/29/2017  Falls in the past year? Yes  Number falls in past yr: 2 or more  Injury with Fall? No   Functional Status Survey:    Vitals:   11/06/17 1027  BP: 133/69  Pulse: 70  Resp: 20  Temp: (!) 96.9 F (36.1 C)  TempSrc: Oral  SpO2: 99%  Weight: 128 lb 1.4 oz (58.1 kg)  Height: 5\' 10"  (1.778 m)   Body mass index is 18.38 kg/m.  Physical Exam  GENERAL APPEARANCE:  In no acute distress.  SKIN:  Skin is warm and dry.  MOUTH and THROAT:  Lips are without lesions. Oral mucosa is moist and without lesions. RESPIRATORY: Breathing is even & unlabored, BS CTAB CARDIAC: RRR, no murmur,no extra heart sounds, no edema, Left upper arm with AV Fistula +bruit/thrill GI: Abdomen soft, normal BS, no masses, no tenderness, no hepatomegaly, no splenomegaly EXTREMITIES:  Able to move X 4 extremities PSYCHIATRIC: Alert and oriented X 3. Affect and behavior are appropriate   Labs reviewed: Recent Labs    12/08/16 1504 12/10/16 1834  11/01/17 1130 11/02/17 0626 11/03/17 0808  NA 142 134*   < > 136 136 136  K 4.5 2.9*   < > 5.0 4.4 3.4*  CL 104 98*   < > 99* 98* 96*  CO2 28 27   < > 21* 25 26  GLUCOSE 103* 89   < > 123* 84 91  BUN 37* 9   < > 81* 45* 23*  CREATININE 10.46* 3.00*   < > 11.57* 7.80* 4.77*  CALCIUM 9.1 8.8*   < > 9.5 9.8 9.4  PHOS 4.2 1.5*  --   --   --    --    < > = values in this interval not displayed.   Recent Labs    06/28/17 1125 06/29/17 0439 11/01/17 1130  AST 16 37 18  ALT 8* 13* 13*  ALKPHOS 73 61 106  BILITOT 0.9 0.8 1.3*  PROT 6.9 5.9* 7.5  ALBUMIN 3.5 3.0* 3.2*   Recent Labs    08/08/17 09/14/17 1846  11/01/17 1130 11/02/17 0626 11/03/17 0808  WBC 6.4 5.4  --  10.7* 6.6 6.8  NEUTROABS 3 2.9  --  8.9*  --   --   HGB 10.8* 11.7*   < > 11.8* 10.8* 11.2*  HCT 33* 34.0*   < > 34.7* 31.7* 33.2*  MCV  --  84.8  --  86.5 85.0 86.0  PLT 141* 196  --  194 201 212   < > = values in this interval not displayed.   Lab Results  Component Value Date   TSH 1.685 01/25/2017   Lab Results  Component Value Date   HGBA1C 4.4 07/31/2017   Lab Results  Component Value Date   CHOL 135 01/24/2017   HDL 37 (L) 01/24/2017   LDLCALC 74 01/24/2017   TRIG 119 01/24/2017   CHOLHDL 3.6 01/24/2017    Significant Diagnostic Results in last 30 days:  Dg Chest 2 View  Result Date: 11/03/2017 CLINICAL DATA:  Shortness of Breath EXAM: CHEST  2 VIEW COMPARISON:  November 01, 2017 FINDINGS: There has been interval clearing of interstitial edema compared to recent study. Currently no edema or consolidation is evident. The heart size and pulmonary vascular normal. No adenopathy. There is aortic atherosclerosis. A pacemaker regulator device is located over the left anterior chest, stable. No bone lesions evident. IMPRESSION: Interval clearing of edema. Currently no edema or consolidation. Heart size within normal limits. There is aortic atherosclerosis. Aortic Atherosclerosis (ICD10-I70.0). Electronically Signed   By: Lowella Grip III M.D.   On: 11/03/2017 10:59   Dg Chest Port 1 View  Result Date: 11/01/2017 CLINICAL DATA:  Shortness of Breath EXAM: PORTABLE CHEST 1 VIEW COMPARISON:  06/28/2017 FINDINGS: Severe diffuse interstitial opacities throughout the lungs, worsened since prior study. Heart is borderline in size. Possible small  bilateral effusions. No acute bony abnormality. IMPRESSION: Severe diffuse interstitial disease which could reflect interstitial edema or interstitial pneumonia. Electronically Signed   By: Rolm Baptise M.D.   On: 11/01/2017  11:59    Assessment/Plan  1. Acute pulmonary edema (HCC) - secondary to missed dialysis, was put on CPAP and had dialysis in the hospital (11/01/17), chest x-ray revealed pulmonary edema has been resolved   2. ESRD on hemodialysis ([place]) - continue dialysis Q M-W-F   3. Essential hypertension  - continue metoprolol tartrate 25 mg 1 tablet twice a day, Norvasc 10 mg 1 tab daily, hydralazine 25 mg 1 tab 3 times a day   4. Anemia in other chronic diseases classified elsewhere - continue Auryxia 210 mg daily 3 tabs 3 times a day with meals Lab Results  Component Value Date   HGB 11.2 (L) 11/03/2017     5. History of CVA (cerebrovascular accident) - stable, continue aspirin 81 mg 1 daily, Lipitor 40 mg 1 tab daily at bedtime, metoprolol tartrate 25 mg 1 tablet twice a day, Norvasc 10 mg 1 tab daily, Plavix 75 mg 1 tab daily, hydralazine 25 mg 1 tab 3 times a day and Lipitor 40 mg 1 tab daily at bedtime   6. Neuropathy - stable, continue Neurontin 100 mg 1 capsule 3 times a day   7. CAD - stable, continue isosorbide mononitrate ER 30 mg 1 tab daily, aspirin 81 mg 1 tab daily   8. Non-allergic rhinitis - is continue Flonase 50 g 1 spray into each nostril 2 times a day     Family/ staff Communication:  Discussed plan of care with patient and charge nurse  Labs/tests ordered:  None  Goals of care:   Long-term care   Durenda Age, NP [facility] [phone] (Monday-Friday 8:00 a.m. - 5:00 p.m.) [phone] (after hours)

## 2017-11-09 DIAGNOSIS — R262 Difficulty in walking, not elsewhere classified: Secondary | ICD-10-CM | POA: Diagnosis not present

## 2017-11-09 DIAGNOSIS — I2510 Atherosclerotic heart disease of native coronary artery without angina pectoris: Secondary | ICD-10-CM | POA: Diagnosis not present

## 2017-11-09 DIAGNOSIS — N186 End stage renal disease: Secondary | ICD-10-CM | POA: Diagnosis not present

## 2017-11-09 DIAGNOSIS — M6281 Muscle weakness (generalized): Secondary | ICD-10-CM | POA: Diagnosis not present

## 2017-11-10 ENCOUNTER — Encounter: Payer: Self-pay | Admitting: Internal Medicine

## 2017-11-10 ENCOUNTER — Non-Acute Institutional Stay (SKILLED_NURSING_FACILITY): Payer: Medicare Other | Admitting: Internal Medicine

## 2017-11-10 DIAGNOSIS — G4720 Circadian rhythm sleep disorder, unspecified type: Secondary | ICD-10-CM | POA: Insufficient documentation

## 2017-11-10 DIAGNOSIS — H547 Unspecified visual loss: Secondary | ICD-10-CM

## 2017-11-10 DIAGNOSIS — R262 Difficulty in walking, not elsewhere classified: Secondary | ICD-10-CM | POA: Diagnosis not present

## 2017-11-10 DIAGNOSIS — N186 End stage renal disease: Secondary | ICD-10-CM | POA: Diagnosis not present

## 2017-11-10 DIAGNOSIS — I2510 Atherosclerotic heart disease of native coronary artery without angina pectoris: Secondary | ICD-10-CM | POA: Diagnosis not present

## 2017-11-10 DIAGNOSIS — M6281 Muscle weakness (generalized): Secondary | ICD-10-CM | POA: Diagnosis not present

## 2017-11-10 DIAGNOSIS — J810 Acute pulmonary edema: Secondary | ICD-10-CM | POA: Diagnosis not present

## 2017-11-10 NOTE — Patient Instructions (Signed)
See assessment and plan under each diagnosis in the problem list and acutely for this visit 

## 2017-11-10 NOTE — Assessment & Plan Note (Signed)
Ophthalmology follow-up at next [name] clinic visit Attempt to get books on tape through program for visually impaired

## 2017-11-10 NOTE — Assessment & Plan Note (Addendum)
No medication triggers identified  Asked to keep diary to identify potential triggers; sleep subspecialty evaluation may be indicated with Dr. Beacher May if symptoms are significant or progressive

## 2017-11-10 NOTE — Assessment & Plan Note (Addendum)
Resolution with urgent dialysis 12/2 11/10/17: patient has missed dialysis 12/10 due to logistical issues with the [facility], dialysis could not be completed today. Clinically he appears stable despite  HD interruption . Dialysis will be completed 12/12.

## 2017-11-10 NOTE — Progress Notes (Signed)
NURSING HOME LOCATION:  [facility] ROOM NUMBER:  [id number]  CODE STATUS:  Full Code  PCP:  [name]  [address] [place] [address]   This is a nursing facility follow up for [place] readmission within 30 days  Interim medical record and care since last [place] visit was updated with review of diagnostic studies and change in clinical status since last visit were documented.  HPI: The patient was rehospitalized 12/2-12/4/18 with acute hypoxic respiratory failure and pulmonary edema in the context of having missed hemodialysis 10/31/17. He states that he had slept poorly the night of 11/30 and was short of breath.He was very weak the next morning 12/1. Because of the weakness without localizing symptoms or signs, he declined to go to hemodialysis. EMS documented saturations of 50% on room air .He was placed on CPAP with sats increasing to 80-90 percent range. Urgent dialysis was completed 12/2. Post dialysis Xray revealed clearing of edema. O2 sats were 93% on room air. 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 Hydralazine, Imdur, Toprol, aspirin, as were continued. He exhibited no anginal type symptoms. At discharge potassium 3.4, BUN 23, creatinine 4.77, GFR 13, hemoglobin 11.2, and hematocrit 33.2. Indices were normochromic, normocytic.  Review of systems: His last dialysis was 12/7. He was scheduled yesterday 12/10, but the transportation [place] did not run. He denies feeling weak this morning or short of breath. He states that his sleep is irregular. Some nights he will sleep well but other nights he may be up all night. He does not identify specific trigger. He denies paroxysmal nocturnal dyspnea.  Constitutional: No fever,significant weight change, fatigue  Eyes: No redness, discharge, pain, new vision change ENT/mouth: No nasal congestion,  purulent discharge, earache,change in hearing  ,sore throat  Cardiovascular: No chest pain, palpitations,paroxysmal nocturnal dyspnea, claudication, edema  Respiratory: No cough, sputum production,hemoptysis, DOE , significant snoring,apnea  Gastrointestinal: No heartburn,dysphagia,abdominal pain, nausea / vomiting,rectal bleeding, melena,change in bowels Genitourinary: No dysuria,hematuria, pyuria,  incontinence, nocturia Musculoskeletal: No joint stiffness, joint swelling, weakness,pain Dermatologic: No rash, pruritus, change in appearance of skin Neurologic: No dizziness,headache,syncope, seizures, numbness , tingling Psychiatric: No significant anxiety, depression, insomnia, anorexia Endocrine: No change in hair/skin/ nails, excessive thirst, excessive hunger, excessive urination  Hematologic/lymphatic: No significant bruising, lymphadenopathy, abnormal bleeding Allergy/immunology: No itchy/ watery eyes, significant sneezing, urticaria, angioedema  Physical exam:  Pertinent or positive findings: Temporal wasting is present. He has marked decreased vision,. 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 He has complete dentures. Grade 1/2 systolic murmur is present at the base. The first heart sound is accentuated. Breath sounds are decreased. Shunts are present over the abdomen in the right lower quadrant and suprapubic areas.Pedal pulses are decreased.  General appearance: no acute distress , increased work of breathing is present.   Lymphatic: No lymphadenopathy about the head, neck, axilla . Eyes: No conjunctival inflammation or lid edema is present. There is no scleral icterus. Ears:  External ear exam shows no significant lesions or deformities.   Nose:  External nasal examination shows no deformity or inflammation. Nasal mucosa are pink and moist without lesions ,exudates Oral exam: lips and gums are healthy appearing.There is no oropharyngeal erythema or exudate . Neck:  No thyromegaly, masses, tenderness noted.    Heart:  Normal rate and  regular rhythm. S2 normal without gallop, click, rub .  Lungs: without wheezes, rhonchi,rales , rubs. Abdomen:Bowel sounds are normal. Abdomen is soft and nontender with  no organomegaly, hernias,masses. GU: deferred  Extremities:  No cyanosis, clubbing,edema  Skin: Warm & dry w/o tenting. No significant lesions or rash.  See summary under each active problem in the Problem List with associated updated therapeutic plan

## 2017-11-11 ENCOUNTER — Encounter: Payer: Self-pay | Admitting: Internal Medicine

## 2017-11-11 DIAGNOSIS — R262 Difficulty in walking, not elsewhere classified: Secondary | ICD-10-CM | POA: Diagnosis not present

## 2017-11-11 DIAGNOSIS — N186 End stage renal disease: Secondary | ICD-10-CM | POA: Diagnosis not present

## 2017-11-11 DIAGNOSIS — I2510 Atherosclerotic heart disease of native coronary artery without angina pectoris: Secondary | ICD-10-CM | POA: Diagnosis not present

## 2017-11-11 DIAGNOSIS — M6281 Muscle weakness (generalized): Secondary | ICD-10-CM | POA: Diagnosis not present

## 2017-11-12 DIAGNOSIS — R262 Difficulty in walking, not elsewhere classified: Secondary | ICD-10-CM | POA: Diagnosis not present

## 2017-11-12 DIAGNOSIS — I2510 Atherosclerotic heart disease of native coronary artery without angina pectoris: Secondary | ICD-10-CM | POA: Diagnosis not present

## 2017-11-12 DIAGNOSIS — M6281 Muscle weakness (generalized): Secondary | ICD-10-CM | POA: Diagnosis not present

## 2017-11-12 DIAGNOSIS — N186 End stage renal disease: Secondary | ICD-10-CM | POA: Diagnosis not present

## 2017-11-13 DIAGNOSIS — M6281 Muscle weakness (generalized): Secondary | ICD-10-CM | POA: Diagnosis not present

## 2017-11-13 DIAGNOSIS — R262 Difficulty in walking, not elsewhere classified: Secondary | ICD-10-CM | POA: Diagnosis not present

## 2017-11-13 DIAGNOSIS — I2510 Atherosclerotic heart disease of native coronary artery without angina pectoris: Secondary | ICD-10-CM | POA: Diagnosis not present

## 2017-11-13 DIAGNOSIS — N186 End stage renal disease: Secondary | ICD-10-CM | POA: Diagnosis not present

## 2017-11-16 DIAGNOSIS — N186 End stage renal disease: Secondary | ICD-10-CM | POA: Diagnosis not present

## 2017-11-16 DIAGNOSIS — R262 Difficulty in walking, not elsewhere classified: Secondary | ICD-10-CM | POA: Diagnosis not present

## 2017-11-16 DIAGNOSIS — M6281 Muscle weakness (generalized): Secondary | ICD-10-CM | POA: Diagnosis not present

## 2017-11-16 DIAGNOSIS — I2510 Atherosclerotic heart disease of native coronary artery without angina pectoris: Secondary | ICD-10-CM | POA: Diagnosis not present

## 2017-11-17 DIAGNOSIS — N186 End stage renal disease: Secondary | ICD-10-CM | POA: Diagnosis not present

## 2017-11-17 DIAGNOSIS — R262 Difficulty in walking, not elsewhere classified: Secondary | ICD-10-CM | POA: Diagnosis not present

## 2017-11-17 DIAGNOSIS — M6281 Muscle weakness (generalized): Secondary | ICD-10-CM | POA: Diagnosis not present

## 2017-11-17 DIAGNOSIS — I2510 Atherosclerotic heart disease of native coronary artery without angina pectoris: Secondary | ICD-10-CM | POA: Diagnosis not present

## 2017-11-18 DIAGNOSIS — I2510 Atherosclerotic heart disease of native coronary artery without angina pectoris: Secondary | ICD-10-CM | POA: Diagnosis not present

## 2017-11-18 DIAGNOSIS — N186 End stage renal disease: Secondary | ICD-10-CM | POA: Diagnosis not present

## 2017-11-18 DIAGNOSIS — M6281 Muscle weakness (generalized): Secondary | ICD-10-CM | POA: Diagnosis not present

## 2017-11-18 DIAGNOSIS — R262 Difficulty in walking, not elsewhere classified: Secondary | ICD-10-CM | POA: Diagnosis not present

## 2017-11-19 DIAGNOSIS — R262 Difficulty in walking, not elsewhere classified: Secondary | ICD-10-CM | POA: Diagnosis not present

## 2017-11-19 DIAGNOSIS — M6281 Muscle weakness (generalized): Secondary | ICD-10-CM | POA: Diagnosis not present

## 2017-11-19 DIAGNOSIS — I2510 Atherosclerotic heart disease of native coronary artery without angina pectoris: Secondary | ICD-10-CM | POA: Diagnosis not present

## 2017-11-19 DIAGNOSIS — N186 End stage renal disease: Secondary | ICD-10-CM | POA: Diagnosis not present

## 2017-12-07 ENCOUNTER — Encounter: Payer: Self-pay | Admitting: Adult Health

## 2017-12-07 NOTE — Progress Notes (Signed)
This encounter was created in error - please disregard.

## 2017-12-07 NOTE — Progress Notes (Deleted)
Location:  [place] Room Number: 128-B Place of Service:  [name] (31) Provider:  Durenda Age, NP  Patient Care Team: [facility], [name] as PCP - General Stanford Breed Trevor Bors, MD (Cardiology)  Extended Emergency Contact Information Primary Emergency Contact: [name] of [place] Work Phone: [phone] Mobile Phone: [phone] Relation: Daughter Secondary Emergency Contact: [place] of [name] Phone: [phone] Relation: Son  Code Status:  Full Code  Goals of care: Advanced Directive information Advanced Directives 11/01/2017  Does Patient Have a Medical Advance Directive? No  Type of Advance Directive -  Does patient want to make changes to medical advance directive? -  Copy of [name] in Chart? -  Would patient like information on creating a medical advance directive? No - Patient declined  Pre-existing out of facility DNR order (yellow form or pink MOST form) -     Chief Complaint  Patient presents with  . Medical Management of Chronic Issues    Routine [name] visit    HPI:  Pt is a 72 y.o. male seen today for medical management of chronic diseases.  He is a long-term care resident of [facility].  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     Past Medical History:  Diagnosis Date  . AAA (abdominal aortic aneurysm) ([place])   . Anxiety   . Arthritis   . Blindness   . CHF (congestive heart failure) ([place])   . 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 ESRD on hemodialysis ([place])    started HD 2014-15  . Headache(784.0)   . HTN (hypertension)   . Protein-calorie malnutrition ([place])    Past Surgical History:  Procedure Laterality Date  . AXILLARY-FEMORAL BYPASS GRAFT  06/28/2017   Procedure: BYPASS GRAFT RIGHT  AXILLA-BIFEMORAL USING 8MM X 30CM AND 8MM X 60CM HEMASHIELD GOLD GRAFTS;  Surgeon: Rosetta Posner, MD;  Location: [place];  Service: Vascular;;  . CARDIAC CATHETERIZATION N/A 11/01/2016   Procedure: Left Heart Cath and Coronary Angiography;  Surgeon: Lorretta Harp, MD;  Location: [place] CV LAB;  Service: Cardiovascular;  Laterality: N/A;  . ENDARTERECTOMY FEMORAL Bilateral 06/28/2017   Procedure: ENDARTERECTOMY BILATERAL FEMORAL ARTERIES;  Surgeon: Rosetta Posner, MD;  Location: [place];  Service: Vascular;  Laterality: Bilateral;  . INSERTION OF DIALYSIS CATHETER  09/21/2012   Procedure: INSERTION OF DIALYSIS CATHETER;  Surgeon: Angelia Mould, MD;  Location: [place];  Service: Vascular;  Laterality: N/A;  Right Internal Jugular Placement  . LEFT HEART CATHETERIZATION WITH CORONARY ANGIOGRAM N/A 09/28/2012   Procedure: LEFT HEART CATHETERIZATION WITH CORONARY ANGIOGRAM;  Surgeon: Sherren Mocha, MD;  Location: [facility] CATH LAB;  Service: Cardiovascular;  Laterality: N/A;  . LEFT HEART CATHETERIZATION WITH CORONARY ANGIOGRAM N/A 07/07/2014   Procedure: LEFT HEART CATHETERIZATION WITH CORONARY ANGIOGRAM;  Surgeon: Leonie Man, MD;  Location: [facility] CATH LAB;  Service: Cardiovascular;  Laterality: N/A;  . right hand    . TEE WITHOUT CARDIOVERSION  10/09/2011   Procedure: TRANSESOPHAGEAL ECHOCARDIOGRAM (TEE);  Surgeon: Lelon Perla, MD;  Location: [facility] ENDOSCOPY;  Service: Cardiovascular;  Laterality: N/A;    No Known Allergies  Outpatient Encounter Medications as of 12/07/2017  Medication Sig  . Amino Acids-Protein Hydrolys (FEEDING SUPPLEMENT, PRO-STAT SUGAR FREE 64,) LIQD Take 30 mLs by mouth  2 (two) times daily.  Marland Kitchen amLODipine (NORVASC) 10 MG tablet Take 10 mg by mouth at bedtime.   Marland Kitchen aspirin 81 MG chewable tablet Chew 1 tablet (81 mg total) by mouth daily.  Marland Kitchen atorvastatin (LIPITOR) 40 MG tablet Take 40 mg by mouth at bedtime.  . bisacodyl (DULCOLAX) 10 MG suppository Place 10 mg rectally daily  as needed (constipation not relieved by MOM).  . clopidogrel (PLAVIX) 75 MG tablet Take 1 tablet (75 mg total) by mouth daily.  Marland Kitchen docusate sodium (COLACE) 100 MG capsule Take 200 mg by mouth at bedtime.   . ferric citrate (AURYXIA) 1 GM 210 MG(Fe) tablet Take 630 mg by mouth 3 (three) times daily with meals.   . fluticasone (FLONASE) 50 MCG/ACT nasal spray Place 1 spray into both nostrils 2 (two) times daily.   Marland Kitchen gabapentin (NEURONTIN) 100 MG capsule Take 1 capsule (100 mg total) by mouth 3 (three) times daily.  . hydrALAZINE (APRESOLINE) 25 MG tablet Take 25 mg by mouth 3 (three) times daily.   . isosorbide mononitrate (IMDUR) 30 MG 24 hr tablet Take 30 mg by mouth daily.  . metoprolol tartrate (LOPRESSOR) 25 MG tablet Take 1 tablet (25 mg total) by mouth 2 (two) times daily.  . multivitamin (RENA-VIT) TABS tablet Take 1 tablet by mouth at bedtime.  . Nutritional Supplements (FEEDING SUPPLEMENT, NEPRO CARB STEADY,) LIQD Take 237 mLs by mouth 2 (two) times daily between meals.  . Omega-3 Fatty Acids (FISH OIL) 1000 MG CAPS Take 1,000 mg by mouth 2 (two) times daily.   No facility-administered encounter medications on file as of 12/07/2017.     Review of Systems  GENERAL: No change in appetite, no fatigue, no weight changes, no fever, chills or weakness SKIN: Denies rash, itching, wounds, ulcer sores, or nail abnormalities EYES: Denies change in vision, dry eyes, eye pain, itching or discharge EARS: Denies change in hearing, ringing in ears, or earache NOSE: Denies nasal congestion or epistaxis MOUTH and THROAT: Denies oral discomfort, gingival pain or bleeding, pain from teeth or hoarseness   RESPIRATORY: no cough, SOB, DOE, wheezing, hemoptysis CARDIAC: No chest pain, edema or palpitations GI: No abdominal pain, diarrhea, constipation, heart burn, nausea or vomiting GU: Denies dysuria, frequency, hematuria, incontinence, or discharge MUSCULOSKELETAL: Denies joint pain, muscle pain, back  pain, restricted movement, or unusual weakness CIRCULATION: Denies claudication, edema of legs, varicosities, or cold extremities NEUROLOGICAL: Denies dizziness, syncope, numbness, or headache PSYCHIATRIC: Denies feelings of depression or anxiety. No report of hallucinations, insomnia, paranoia, or agitation ENDOCRINE: Denies polyphagia, polyuria, polydipsia, heat or cold intolerance HEME/LYMPH: Denies excessive bruising, petechia, enlarged lymph nodes, or bleeding problems IMMUNOLOGIC: Denies history of frequent infections, AIDS, or use of immunosuppressive agents   Immunization History  Administered Date(s) Administered  . Influenza,inj,Quad PF,6+ Mos 08/14/2015  . Influenza-Unspecified 08/31/2016, 08/01/2017  . PPD Test 01/17/2016, 07/02/2017  . Pneumococcal Polysaccharide-23 08/14/2015  . Tdap 10/12/2017   Pertinent  Health Maintenance Due  Topic Date Due  . INFLUENZA VACCINE  02/28/2018 (Originally 07/01/2017)  . PNA vac Low Risk Adult (2 of 2 - PCV13) 08/13/2018 (Originally 08/13/2016)  . COLONOSCOPY  08/08/2027 (Originally 11/06/1995)   Fall Risk  09/29/2017  Falls in the past year? Yes  Number falls in past yr: 2 or more  Injury with Fall? No    Physical Exam  GENERAL APPEARANCE: Well nourished. In no acute distress. Normal body habitus SKIN:  Skin is warm and dry. There are no suspicious lesions or rash  HEAD: Normal in size and contour. No evidence of trauma EYES: Lids open and close normally. No blepharitis, entropion or ectropion. PERRL. Conjunctivae are clear and sclerae are white. Lenses are without opacity EARS: Pinnae are normal. Patient hears normal voice tunes of the examiner MOUTH and THROAT: Lips are without lesions. Oral mucosa is moist and without lesions. Tongue is normal in shape, size, and color and without lesions NECK: supple, trachea midline, no neck masses, no thyroid tenderness, no thyromegaly LYMPHATICS: No LAN in the neck, no supraclavicular  LAN RESPIRATORY: Breathing is even & unlabored, BS CTAB CARDIAC: RRR, no murmur,no extra heart sounds, no edema GI: Abdomen soft, normal BS, no masses, no tenderness, no hepatomegaly, no splenomegaly MUSCULOSKELETAL: No deformities. Movement at each extremity is full and painless. Strength is 5/5 at each extremity. Back is without kyphosis or scoliosis CIRCULATION: Pedal pulses are 2+. There is no edema of the legs, ankles and feet NEUROLOGICAL: There is no tremor. Speech is clear PSYCHIATRIC: Alert and oriented X 3. Affect and behavior are appropriate  Labs reviewed: Recent Labs    12/08/16 1504 12/10/16 1834  11/01/17 1130 11/02/17 0626 11/03/17 0808  NA 142 134*   < > 136 136 136  K 4.5 2.9*   < > 5.0 4.4 3.4*  CL 104 98*   < > 99* 98* 96*  CO2 28 27   < > 21* 25 26  GLUCOSE 103* 89   < > 123* 84 91  BUN 37* 9   < > 81* 45* 23*  CREATININE 10.46* 3.00*   < > 11.57* 7.80* 4.77*  CALCIUM 9.1 8.8*   < > 9.5 9.8 9.4  PHOS 4.2 1.5*  --   --   --   --    < > = values in this interval not displayed.   Recent Labs    06/28/17 1125 06/29/17 0439 11/01/17 1130  AST 16 37 18  ALT 8* 13* 13*  ALKPHOS 73 61 106  BILITOT 0.9 0.8 1.3*  PROT 6.9 5.9* 7.5  ALBUMIN 3.5 3.0* 3.2*   Recent Labs    08/08/17 09/14/17 1846  11/01/17 1130 11/02/17 0626 11/03/17 0808  WBC 6.4 5.4  --  10.7* 6.6 6.8  NEUTROABS 3 2.9  --  8.9*  --   --   HGB 10.8* 11.7*   < > 11.8* 10.8* 11.2*  HCT 33* 34.0*   < > 34.7* 31.7* 33.2*  MCV  --  84.8  --  86.5 85.0 86.0  PLT 141* 196  --  194 201 212   < > = values in this interval not displayed.   Lab Results  Component Value Date   TSH 1.685 01/25/2017   Lab Results  Component Value Date   HGBA1C 4.4 07/31/2017   Lab Results  Component Value Date   CHOL 135 01/24/2017   HDL 37 (L) 01/24/2017   LDLCALC 74 01/24/2017   TRIG 119 01/24/2017   CHOLHDL 3.6 01/24/2017    Assessment/Plan ***   Family/ staff Communication: ***  Labs/tests  ordered:  ***  Goals of care:   ***   Durenda Age, NP [facility] [phone] (Monday-Friday 8:00 a.m. - 5:00 p.m.) [phone] (after hours)  This encounter was created in error - please disregard. This encounter was created in error - please disregard.

## 2017-12-08 ENCOUNTER — Encounter: Payer: Self-pay | Admitting: Adult Health

## 2017-12-08 ENCOUNTER — Non-Acute Institutional Stay (SKILLED_NURSING_FACILITY): Payer: Medicare Other | Admitting: Adult Health

## 2017-12-08 DIAGNOSIS — G629 Polyneuropathy, unspecified: Secondary | ICD-10-CM | POA: Diagnosis not present

## 2017-12-08 DIAGNOSIS — N186 End stage renal disease: Secondary | ICD-10-CM

## 2017-12-08 DIAGNOSIS — Z8673 Personal history of transient ischemic attack (TIA), and cerebral infarction without residual deficits: Secondary | ICD-10-CM

## 2017-12-08 DIAGNOSIS — Z992 Dependence on renal dialysis: Secondary | ICD-10-CM | POA: Diagnosis not present

## 2017-12-08 DIAGNOSIS — I2510 Atherosclerotic heart disease of native coronary artery without angina pectoris: Secondary | ICD-10-CM

## 2017-12-08 DIAGNOSIS — I10 Essential (primary) hypertension: Secondary | ICD-10-CM

## 2017-12-08 DIAGNOSIS — D638 Anemia in other chronic diseases classified elsewhere: Secondary | ICD-10-CM

## 2017-12-08 DIAGNOSIS — R079 Chest pain, unspecified: Secondary | ICD-10-CM | POA: Diagnosis not present

## 2017-12-08 NOTE — Progress Notes (Signed)
Location:  [place] Room Number: 128-B Place of Service:  [name] (31) Provider:  Durenda Age, NP  Patient Care Team: [facility], [name] as PCP - General Stanford Breed [name], MD (Cardiology)  Extended Emergency Contact Information Primary Emergency Contact: [name] Work Phone: [phone] Mobile Phone: [phone] Relation: Daughter Secondary Emergency Contact: [place] of [place] Phone: [phone] Relation: Son  Code Status:  FULL CODE  Goals of care: Advanced Directive information Advanced Directives 11/01/2017  Does Patient Have a Medical Advance Directive? No  Type of Advance Directive -  Does patient want to make changes to medical advance directive? -  Copy of [place] in Chart? -  Would patient like information on creating a medical advance directive? No - Patient declined  Pre-existing out of facility DNR order (yellow form or pink MOST form) -     Chief Complaint  Patient presents with  . Medical Management of Chronic Issues    Routine [name] visit    HPI:  Pt is a 72 y.o. male seen today for medical management of chronic diseases.  He is a long-term care resident of [facility].  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 He was seen in the room today. He reportted that he tried to pick up his duffel bag by reaching out with his right hand but hit his right chest on his wheelchair handle. Chest x-ray was done and no fracture was noted. No redness on the area. He said that his right chest only hurts when he lifts his right arm.   Past Medical History:  Diagnosis Date  . AAA (abdominal aortic aneurysm) ([place])   . Anxiety   . Arthritis   . Blindness   . CHF (congestive heart failure) ([place])   . CVA (cerebral infarction)    caused blindness,  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  . ESRD on hemodialysis ([place])    started HD 2014-15  . Headache(784.0)   . HTN (hypertension)   . Protein-calorie malnutrition ([place])    Past Surgical History:  Procedure Laterality Date  . AXILLARY-FEMORAL BYPASS GRAFT  06/28/2017   Procedure: BYPASS GRAFT RIGHT AXILLA-BIFEMORAL USING 8MM X 30CM AND 8MM X 60CM HEMASHIELD GOLD GRAFTS;  Surgeon: Rosetta Posner, MD;  Location: [place];  Service: Vascular;;  . CARDIAC CATHETERIZATION N/A 11/01/2016   Procedure: Left Heart Cath and Coronary Angiography;  Surgeon: Lorretta Harp, MD;  Location: [place] CV LAB;  Service: Cardiovascular;  Laterality: N/A;  . ENDARTERECTOMY FEMORAL Bilateral 06/28/2017   Procedure: ENDARTERECTOMY BILATERAL FEMORAL ARTERIES;  Surgeon: Rosetta Posner, MD;  Location: [place];  Service: Vascular;  Laterality: Bilateral;  . INSERTION OF DIALYSIS CATHETER  09/21/2012   Procedure: INSERTION OF DIALYSIS CATHETER;  Surgeon: Angelia Mould, MD;  Location: [place];  Service: Vascular;  Laterality: N/A;  Right Internal Jugular Placement  . LEFT HEART CATHETERIZATION WITH CORONARY ANGIOGRAM N/A 09/28/2012   Procedure: LEFT HEART CATHETERIZATION WITH CORONARY ANGIOGRAM;  Surgeon: Sherren Mocha, MD;  Location: [facility] CATH LAB;  Service: Cardiovascular;  Laterality: N/A;  . LEFT HEART CATHETERIZATION WITH CORONARY ANGIOGRAM N/A 07/07/2014   Procedure: LEFT HEART CATHETERIZATION WITH CORONARY ANGIOGRAM;  Surgeon: Leonie Man, MD;  Location: [facility] CATH LAB;  Service: Cardiovascular;  Laterality: N/A;  . right hand    .  TEE WITHOUT CARDIOVERSION  10/09/2011   Procedure: TRANSESOPHAGEAL ECHOCARDIOGRAM (TEE);  Surgeon: Lelon Perla, MD;  Location: [facility] ENDOSCOPY;  Service: Cardiovascular;  Laterality: N/A;    No Known Allergies  Outpatient Encounter Medications as of 12/08/2017  Medication Sig  . Amino Acids-Protein Hydrolys (FEEDING SUPPLEMENT, PRO-STAT SUGAR FREE 64,) LIQD Take 30 mLs by mouth 2  (two) times daily.  Marland Kitchen amLODipine (NORVASC) 10 MG tablet Take 10 mg by mouth at bedtime.   Marland Kitchen aspirin 81 MG chewable tablet Chew 1 tablet (81 mg total) by mouth daily.  Marland Kitchen atorvastatin (LIPITOR) 40 MG tablet Take 40 mg by mouth at bedtime.  . bisacodyl (DULCOLAX) 10 MG suppository Place 10 mg rectally daily as needed (constipation not relieved by MOM).  . clopidogrel (PLAVIX) 75 MG tablet Take 1 tablet (75 mg total) by mouth daily.  Marland Kitchen docusate sodium (COLACE) 100 MG capsule Take 200 mg by mouth at bedtime.   . ferric citrate (AURYXIA) 1 GM 210 MG(Fe) tablet Take 630 mg by mouth 3 (three) times daily with meals.   . fluticasone (FLONASE) 50 MCG/ACT nasal spray Place 1 spray into both nostrils 2 (two) times daily.   Marland Kitchen gabapentin (NEURONTIN) 100 MG capsule Take 1 capsule (100 mg total) by mouth 3 (three) times daily.  . hydrALAZINE (APRESOLINE) 25 MG tablet Take 25 mg by mouth 3 (three) times daily.   . isosorbide mononitrate (IMDUR) 30 MG 24 hr tablet Take 30 mg by mouth daily.  . metoprolol tartrate (LOPRESSOR) 25 MG tablet Take 1 tablet (25 mg total) by mouth 2 (two) times daily.  . multivitamin (RENA-VIT) TABS tablet Take 1 tablet by mouth at bedtime.  . Nutritional Supplements (FEEDING SUPPLEMENT, NEPRO CARB STEADY,) LIQD Take 237 mLs by mouth 2 (two) times daily between meals.  . Omega-3 Fatty Acids (FISH OIL) 1000 MG CAPS Take 1,000 mg by mouth 2 (two) times daily.   No facility-administered encounter medications on file as of 12/08/2017.     Review of Systems  GENERAL: No change in appetite, no fatigue, no weight changes, no fever, chills or weakness MOUTH and THROAT: Denies oral discomfort, gingival pain or bleeding RESPIRATORY: no cough, SOB, DOE, wheezing, hemoptysis CARDIAC: No edema or palpitations GI: No abdominal pain, diarrhea, constipation, heart burn, nausea or vomiting PSYCHIATRIC: Denies feelings of depression or anxiety. No report of hallucinations, insomnia, paranoia, or  agitation    Immunization History  Administered Date(s) Administered  . Influenza,inj,Quad PF,6+ Mos 08/14/2015  . Influenza-Unspecified 08/31/2016, 08/01/2017  . PPD Test 01/17/2016, 07/02/2017  . Pneumococcal Polysaccharide-23 08/14/2015  . Tdap 10/12/2017   Pertinent  Health Maintenance Due  Topic Date Due  . PNA vac Low Risk Adult (2 of 2 - PCV13) 08/13/2018 (Originally 08/13/2016)  . COLONOSCOPY  08/08/2027 (Originally 11/06/1995)  . INFLUENZA VACCINE  Completed   Fall Risk  09/29/2017  Falls in the past year? Yes  Number falls in past yr: 2 or more  Injury with Fall? No      Vitals:   12/08/17 1355  BP: (!) 92/57  Pulse: 73  Resp: 20  Temp: 97.6 F (36.4 C)  TempSrc: Oral  SpO2: 98%  Weight: 134 lb 3.2 oz (60.9 kg)  Height: 5\' 10"  (1.778 m)   Body mass index is 19.26 kg/m.  Physical Exam  GENERAL APPEARANCE: Well nourished. In no acute distress. Normal body habitus SKIN:  Skin is warm and dry.  MOUTH and THROAT: Lips are without lesions. Oral mucosa is moist and  without lesions.  RESPIRATORY: Breathing is even & unlabored, BS CTAB CARDIAC: RRR, no murmur,no extra heart sounds, no edema, Left upper arm has AV fistula +bruit/thrill GI: Abdomen soft, normal BS, no masses, no tenderness, no hepatomegaly, no splenomegaly EXTREMITIES:  Able to move X 4 extremities, unable to wiggle right foot toes PSYCHIATRIC: Alert and oriented X 3. Affect and behavior are appropriate   Labs reviewed: Recent Labs    12/08/16 1504 12/10/16 1834  11/01/17 1130 11/02/17 0626 11/03/17 0808  NA 142 134*   < > 136 136 136  K 4.5 2.9*   < > 5.0 4.4 3.4*  CL 104 98*   < > 99* 98* 96*  CO2 28 27   < > 21* 25 26  GLUCOSE 103* 89   < > 123* 84 91  BUN 37* 9   < > 81* 45* 23*  CREATININE 10.46* 3.00*   < > 11.57* 7.80* 4.77*  CALCIUM 9.1 8.8*   < > 9.5 9.8 9.4  PHOS 4.2 1.5*  --   --   --   --    < > = values in this interval not displayed.   Recent Labs    06/28/17 1125  06/29/17 0439 11/01/17 1130  AST 16 37 18  ALT 8* 13* 13*  ALKPHOS 73 61 106  BILITOT 0.9 0.8 1.3*  PROT 6.9 5.9* 7.5  ALBUMIN 3.5 3.0* 3.2*   Recent Labs    08/08/17 09/14/17 1846  11/01/17 1130 11/02/17 0626 11/03/17 0808  WBC 6.4 5.4  --  10.7* 6.6 6.8  NEUTROABS 3 2.9  --  8.9*  --   --   HGB 10.8* 11.7*   < > 11.8* 10.8* 11.2*  HCT 33* 34.0*   < > 34.7* 31.7* 33.2*  MCV  --  84.8  --  86.5 85.0 86.0  PLT 141* 196  --  194 201 212   < > = values in this interval not displayed.   Lab Results  Component Value Date   TSH 1.685 01/25/2017   Lab Results  Component Value Date   HGBA1C 4.4 07/31/2017   Lab Results  Component Value Date   CHOL 135 01/24/2017   HDL 37 (L) 01/24/2017   LDLCALC 74 01/24/2017   TRIG 119 01/24/2017   CHOLHDL 3.6 01/24/2017    Assessment/Plan  1. End-stage renal disease on hemodialysis ([place]) - continue hemodialysis 3X/week   2. Anemia in other chronic diseases classified elsewhere - continue Auryxia 210 mg daily 3 tabs by mouth with meals 3 times a day Lab Results  Component Value Date   HGB 11.2 (L) 11/03/2017     3. Essential hypertension - well-controlled, continue metoprolol tartrate 25 mg 1 tablet twice a day, hydralazine 25 mg 1 tab 3 times a day and Norvasc 10 mg 1 tab daily   4. History of CVA (cerebrovascular accident) -  stable, continue Plavix 75 mg 1 tab daily, Lipitor 40 mg 1 tab daily at bedtime, aspirin 81 mg 1 tab daily, metoprolol tartrate 25 mg 1 tablet twice a day, hydralazine 25 mg 1 tab 3 times a day and Norvasc 10 mg 1 tab daily    5. Neuropathy - stable, continue gabapentin 100 mg 1 capsule 3 times a day   6. Coronary artery disease, non-occlusive - no complaints of chest pains, continue isosorbide mononitrate ER 30 mg 1 tab daily, aspirin 81 mg daily and Plavix 75 mg daily    Family/ staff Communication:  Discussed plan of care with resident.  Labs/tests ordered:  Chest x-ray  Goals of care:    Long-term care   Durenda Age, NP [facility] [phone] (Monday-Friday 8:00 a.m. - 5:00 p.m.) [phone] (after hours)

## 2017-12-11 ENCOUNTER — Encounter (HOSPITAL_COMMUNITY): Payer: Self-pay | Admitting: Emergency Medicine

## 2017-12-11 ENCOUNTER — Emergency Department (HOSPITAL_COMMUNITY)
Admission: EM | Admit: 2017-12-11 | Discharge: 2017-12-11 | Disposition: A | Discharge status: Home / Self Care | Payer: Medicare Other | Attending: Emergency Medicine | Admitting: Emergency Medicine

## 2017-12-11 DIAGNOSIS — Z452 Encounter for adjustment and management of vascular access device: Secondary | ICD-10-CM | POA: Insufficient documentation

## 2017-12-11 DIAGNOSIS — Z7902 Long term (current) use of antithrombotics/antiplatelets: Secondary | ICD-10-CM | POA: Insufficient documentation

## 2017-12-11 DIAGNOSIS — Z7982 Long term (current) use of aspirin: Secondary | ICD-10-CM | POA: Diagnosis not present

## 2017-12-11 DIAGNOSIS — Z992 Dependence on renal dialysis: Secondary | ICD-10-CM | POA: Diagnosis not present

## 2017-12-11 DIAGNOSIS — Z8673 Personal history of transient ischemic attack (TIA), and cerebral infarction without residual deficits: Secondary | ICD-10-CM | POA: Diagnosis not present

## 2017-12-11 DIAGNOSIS — T83498A Other mechanical complication of other prosthetic devices, implants and grafts of genital tract, initial encounter: Secondary | ICD-10-CM | POA: Diagnosis not present

## 2017-12-11 DIAGNOSIS — N186 End stage renal disease: Secondary | ICD-10-CM | POA: Insufficient documentation

## 2017-12-11 DIAGNOSIS — I132 Hypertensive heart and chronic kidney disease with heart failure and with stage 5 chronic kidney disease, or end stage renal disease: Secondary | ICD-10-CM | POA: Insufficient documentation

## 2017-12-11 DIAGNOSIS — Z79899 Other long term (current) drug therapy: Secondary | ICD-10-CM | POA: Diagnosis not present

## 2017-12-11 DIAGNOSIS — Z789 Other specified health status: Secondary | ICD-10-CM

## 2017-12-11 DIAGNOSIS — T82898A Other specified complication of vascular prosthetic devices, implants and grafts, initial encounter: Secondary | ICD-10-CM | POA: Diagnosis not present

## 2017-12-11 DIAGNOSIS — I5042 Chronic combined systolic (congestive) and diastolic (congestive) heart failure: Secondary | ICD-10-CM | POA: Diagnosis not present

## 2017-12-11 DIAGNOSIS — Z87891 Personal history of nicotine dependence: Secondary | ICD-10-CM | POA: Insufficient documentation

## 2017-12-11 NOTE — ED Provider Notes (Signed)
Medical screening examination/treatment/procedure(s) were conducted as a shared visit with non-physician practitioner(s) and myself.  I personally evaluated the patient during the encounter.   EKG Interpretation None       Patient seen by me along with physician assistant.  Patient had dialysis today has a fistula in his left upper arm.  He got home and then started to rebleed.  Today was his normal dialysis day.  EMS wrapped the bleeding site at the AV fistula.  Upon removal of the dressing here bleeding had stopped and was under control.  Got good thrill.  Good distal radial pulse.  Now the bleeding is controlled patient stable for discharge back home and continue with his normal dialysis schedule.   Fredia Sorrow, MD 12/11/17 [phone]

## 2017-12-11 NOTE — ED Provider Notes (Signed)
[facility] EMERGENCY DEPARTMENT Provider Note   CSN: [id number] Arrival date & time: 12/11/17  [place]     History   Chief Complaint Chief Complaint  Patient presents with  . Vascular Access Problem    HPI Trevor [name] is a 72 y.o. male ESRD on dialysis, and other comorbidities, presenting to the ED for bleeding at his vascular access site following dialysis today.  States he was on his way home to [place] when he noticed his arm was bleeding.  EMS applied a pressure dressing.  States this is happened a few times in the past.  Denies any pain at the site.  No other complaints today.  The history is provided by the patient.    Past Medical History:  Diagnosis Date  . AAA (abdominal aortic aneurysm) ([place])   . Anxiety   . Arthritis   . Blindness   . CHF (congestive heart failure) ([place])   . 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 ESRD on hemodialysis ([place])    started HD 2014-15  . Headache(784.0)   . HTN (hypertension)   . Protein-calorie malnutrition [facility])     Patient Active Problem List   Diagnosis Date Noted  . Sleep stage dysfunction 11/10/2017  . 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 Vascular device, implant, or graft complication 76/16/0737  . Peripheral arterial disease ([place]) 10/20/2017  . Non-allergic rhinitis 10/20/2017  . Pain due to onychomycosis of toenails of both feet 07/30/2017  . 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 BPH (benign prostatic hyperplasia) 07/07/2017  . At risk for adverse drug reaction 07/02/2017  . Ischemia of extremity 06/28/2017  . Pulmonary edema 06/27/2017  . Weakness of both legs 01/25/2017  . Hypertensive urgency 01/24/2017  . 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 Ischemic chest pain 11/01/2016  . Hypoxemia 09/07/2016  . Essential hypertension 03/08/2016  . Orthostatic hypotension 01/12/2016  . Dyslipidemia 01/12/2016  . Cough   . Weakness generalized  12/26/2015  . Chest pain 08/12/2015  . Accelerated hypertension 08/12/2015  . Carotid stenosis   . Stroke ([place]) 04/03/2015  . Abdominal aortic aneurysm ([place]) 03/06/2015  . Generalized weakness 09/08/2014  . Acute encephalopathy 09/07/2014  . Protein-calorie malnutrition, severe ([place]) 05/20/2014  . Hypokalemia 05/19/2014  . Syncope and collapse 05/19/2014  . End-stage renal disease on hemodialysis ([place]) 05/19/2014  . Anemia of renal disease 05/19/2014  . Coronary artery disease, non-occlusive:  09/28/2012  . Anemia 09/27/2012  . Secondary hyperparathyroidism ([place]) 09/27/2012  . Non compliance with medical treatment 09/27/2012  . Chronic combined systolic and diastolic CHF (congestive heart failure) ([place]) 10/10/2011  . A-fib ([place]) 10/08/2011  . History of stroke 10/07/2011  . Cocaine abuse ([place]) 10/07/2011  . Severe sinus bradycardia 10/07/2011  . Nonischemic cardiomyopathy ([place]) 09/18/2011  . Tobacco use disorder 08/07/2011  . Bruit 08/07/2011    Past Surgical History:  Procedure Laterality Date  . AXILLARY-FEMORAL BYPASS GRAFT  06/28/2017   Procedure: BYPASS GRAFT RIGHT AXILLA-BIFEMORAL USING 8MM X 30CM AND 8MM X 60CM HEMASHIELD GOLD GRAFTS;  Surgeon: Rosetta Posner, MD;  Location: [facility];  Service: Vascular;;  . CARDIAC CATHETERIZATION N/A 11/01/2016   Procedure: Left Heart Cath and Coronary Angiography;  Surgeon: Lorretta Harp, MD;  Location: [facility] CV LAB;  Service: Cardiovascular;  Laterality: N/A;  . ENDARTERECTOMY FEMORAL Bilateral 06/28/2017   Procedure: ENDARTERECTOMY BILATERAL FEMORAL ARTERIES;  Surgeon: Rosetta Posner, MD;  Location: [facility];  Service: Vascular;  Laterality: Bilateral;  . INSERTION OF DIALYSIS CATHETER  09/21/2012   Procedure: INSERTION OF DIALYSIS CATHETER;  Surgeon: Angelia Mould, MD;  Location: [facility];  Service: Vascular;  Laterality: N/A;  Right Internal Jugular Placement  . LEFT HEART CATHETERIZATION WITH CORONARY ANGIOGRAM N/A 09/28/2012    Procedure: LEFT HEART CATHETERIZATION WITH CORONARY ANGIOGRAM;  Surgeon: Sherren Mocha, MD;  Location: [facility] CATH LAB;  Service: Cardiovascular;  Laterality: N/A;  . LEFT HEART CATHETERIZATION WITH CORONARY ANGIOGRAM N/A 07/07/2014   Procedure: LEFT HEART CATHETERIZATION WITH CORONARY ANGIOGRAM;  Surgeon: Leonie Man, MD;  Location: [facility] CATH LAB;  Service: Cardiovascular;  Laterality: N/A;  . right hand    . TEE WITHOUT CARDIOVERSION  10/09/2011   Procedure: TRANSESOPHAGEAL ECHOCARDIOGRAM (TEE);  Surgeon: Lelon Perla, MD;  Location: [facility] ENDOSCOPY;  Service: Cardiovascular;  Laterality: N/A;       Home Medications    Prior to Admission medications   Medication Sig Start Date End Date Taking? Authorizing Provider  Amino Acids-Protein Hydrolys (FEEDING SUPPLEMENT, PRO-STAT SUGAR FREE 64,) LIQD Take 30 mLs by mouth 2 (two) times daily.    [provider]  amLODipine (NORVASC) 10 MG tablet Take 10 mg by mouth at bedtime.  04/13/17   [provider]  aspirin 81 MG chewable tablet Chew 1 tablet (81 mg total) by mouth daily. 11/04/16   Lyda Jester M, PA-C  atorvastatin (LIPITOR) 40 MG tablet Take 40 mg by mouth at bedtime.    [provider]  bisacodyl (DULCOLAX) 10 MG suppository Place 10 mg rectally daily as needed (constipation not relieved by MOM).    [provider]  clopidogrel (PLAVIX) 75 MG tablet Take 1 tablet (75 mg total) by mouth daily. 04/05/15   Barton Dubois, MD  docusate sodium (COLACE) 100 MG capsule Take 200 mg by mouth at bedtime.     [provider]  ferric citrate (AURYXIA) 1 GM 210 MG(Fe) tablet Take 630 mg by mouth 3 (three) times daily with meals.     [provider]  fluticasone (FLONASE) 50 MCG/ACT nasal spray Place 1 spray into both nostrils 2 (two) times daily.     [provider]  gabapentin (NEURONTIN) 100 MG capsule Take 1 capsule (100 mg total) by mouth 3 (three) times daily. 04/05/15   Barton Dubois,  MD  hydrALAZINE (APRESOLINE) 25 MG tablet Take 25 mg by mouth 3 (three) times daily.     [provider]  isosorbide mononitrate (IMDUR) 30 MG 24 hr tablet Take 30 mg by mouth daily.    [provider]  metoprolol tartrate (LOPRESSOR) 25 MG tablet Take 1 tablet (25 mg total) by mouth 2 (two) times daily. 11/03/16   Lyda Jester M, PA-C  multivitamin (RENA-VIT) TABS tablet Take 1 tablet by mouth at bedtime. 07/02/17   Velvet Bathe, MD  Nutritional Supplements (FEEDING SUPPLEMENT, NEPRO CARB STEADY,) LIQD Take 237 mLs by mouth 2 (two) times daily between meals. 12/11/16   Geradine Girt, DO  Omega-3 Fatty Acids (FISH OIL) 1000 MG CAPS Take 1,000 mg by mouth 2 (two) times daily.    [provider]    Family History Family History  Problem Relation Age of Onset  . Heart attack Mother        MI in her 49s  . Diabetes Mother   . Alcohol abuse Father   . Anesthesia problems Neg Hx   . Hypotension Neg Hx   . Malignant hyperthermia Neg Hx   .  Pseudochol deficiency Neg Hx     Social History Social History   Tobacco Use  . Smoking status: Former Smoker    Packs/day: 0.50    Years: 50.00    Pack years: 25.00    Types: Cigarettes  . Smokeless tobacco: Never Used  . Tobacco comment: quit 05/2017  Substance Use Topics  . Alcohol use: No    Alcohol/week: 0.0 oz    Comment: Occasional  . Drug use: No     Allergies   Patient has no known allergies.   Review of Systems Review of Systems  Constitutional: Negative for fever.  Skin:       Bleeding vascular access  All other systems reviewed and are negative.    Physical Exam Updated Vital Signs BP (!) 162/68 (BP Location: Right Arm)   Pulse 64   Temp 98.4 F (36.9 C) (Oral)   Resp 18   SpO2 100%   Physical Exam  Constitutional: He appears well-developed and well-nourished. No distress.  HENT:  Head: Normocephalic and atraumatic.  Eyes: Conjunctivae are normal.  Cardiovascular: Normal rate  and intact distal pulses.  Pulmonary/Chest: Effort normal.  Abdominal: Soft.  Musculoskeletal:  Left upper arm with fistula, palpable thrill, not actively bleeding. Nontender. Normal ROM of arm. Intact distal pulses.  Neurological: He is alert.  Skin: Skin is warm.  Psychiatric: He has a normal mood and affect. His behavior is normal.  Nursing note and vitals reviewed.    ED Treatments / Results  Labs (all labs ordered are listed, but only abnormal results are displayed) Labs Reviewed - No data to display  EKG  EKG Interpretation None       Radiology No results found.  Procedures Procedures (including critical care time)  Medications Ordered in ED Medications - No data to display   Initial Impression / Assessment and Plan / ED Course  I have reviewed the triage vital signs and the nursing notes.  Pertinent labs & imaging results that were available during my care of the patient were reviewed by me and considered in my medical decision making (see chart for details).     Pt presenting to ED for bleeding at vascular access site following dialysis. On evaluation, dressings removed and access site is not actively bleeding. Site is nontender, palpable thrill. Site redressed. Pt without complaints at this time. Safe for discharge back to living facility.  Patient discussed with and seen by Dr. Rogene Houston.  Discussed results, findings, treatment and follow up. Patient advised of return precautions. Patient verbalized understanding and agreed with plan.  Final Clinical Impressions(s) / ED Diagnoses   Final diagnoses:  Problem with vascular access    ED Discharge Orders    None       Ardie Mclennan, [place] N, PA-C 12/11/17 Hoy Morn, MD 12/11/17 2350

## 2017-12-11 NOTE — ED Notes (Signed)
Pt verbalized understanding of d/c instructions and has no further questions. Pt is stable, A&Ox4, VSS. Pt son here to pick him up.

## 2017-12-11 NOTE — Discharge Instructions (Signed)
It was a pleasure taking care of you today. Return to the ER for new or concerning symptoms.

## 2017-12-11 NOTE — ED Triage Notes (Signed)
Per EMS, pt from [facility], had dialysis today, upon arrival back to facility, bleeding noted to his dialysis access. Dressing applied by EMS, bleeding controlled at this time.

## 2017-12-11 NOTE — ED Notes (Signed)
Pt has fistula to LUE with c/o bleeding from site uncontrollably; at this time; site is secured with roll gauze and no drainage noted through drsg; pt states that he has no pain and is experiencing no difficulty or abnormalities with movement or sensation nto LUE

## 2017-12-11 NOTE — ED Notes (Signed)
PTAR cancelled @ 2142

## 2017-12-23 ENCOUNTER — Encounter (HOSPITAL_COMMUNITY): Payer: Self-pay | Admitting: Emergency Medicine

## 2017-12-23 ENCOUNTER — Emergency Department (HOSPITAL_COMMUNITY)
Admission: EM | Admit: 2017-12-23 | Discharge: 2017-12-23 | Disposition: A | Discharge status: Home / Self Care | Payer: Medicare Other | Attending: Emergency Medicine | Admitting: Emergency Medicine

## 2017-12-23 ENCOUNTER — Other Ambulatory Visit: Payer: Self-pay

## 2017-12-23 ENCOUNTER — Emergency Department (HOSPITAL_COMMUNITY): Payer: Medicare Other

## 2017-12-23 DIAGNOSIS — I132 Hypertensive heart and chronic kidney disease with heart failure and with stage 5 chronic kidney disease, or end stage renal disease: Secondary | ICD-10-CM | POA: Diagnosis not present

## 2017-12-23 DIAGNOSIS — R05 Cough: Secondary | ICD-10-CM | POA: Insufficient documentation

## 2017-12-23 DIAGNOSIS — Z8673 Personal history of transient ischemic attack (TIA), and cerebral infarction without residual deficits: Secondary | ICD-10-CM | POA: Diagnosis not present

## 2017-12-23 DIAGNOSIS — Z8679 Personal history of other diseases of the circulatory system: Secondary | ICD-10-CM | POA: Diagnosis not present

## 2017-12-23 DIAGNOSIS — Y828 Other medical devices associated with adverse incidents: Secondary | ICD-10-CM | POA: Insufficient documentation

## 2017-12-23 DIAGNOSIS — Z7902 Long term (current) use of antithrombotics/antiplatelets: Secondary | ICD-10-CM | POA: Insufficient documentation

## 2017-12-23 DIAGNOSIS — Z992 Dependence on renal dialysis: Secondary | ICD-10-CM | POA: Insufficient documentation

## 2017-12-23 DIAGNOSIS — R059 Cough, unspecified: Secondary | ICD-10-CM

## 2017-12-23 DIAGNOSIS — I5042 Chronic combined systolic (congestive) and diastolic (congestive) heart failure: Secondary | ICD-10-CM | POA: Diagnosis not present

## 2017-12-23 DIAGNOSIS — J189 Pneumonia, unspecified organism: Secondary | ICD-10-CM

## 2017-12-23 DIAGNOSIS — N186 End stage renal disease: Secondary | ICD-10-CM | POA: Insufficient documentation

## 2017-12-23 DIAGNOSIS — Z7982 Long term (current) use of aspirin: Secondary | ICD-10-CM | POA: Diagnosis not present

## 2017-12-23 DIAGNOSIS — T8189XA Other complications of procedures, not elsewhere classified, initial encounter: Secondary | ICD-10-CM | POA: Diagnosis not present

## 2017-12-23 DIAGNOSIS — R58 Hemorrhage, not elsewhere classified: Secondary | ICD-10-CM | POA: Insufficient documentation

## 2017-12-23 DIAGNOSIS — T82838A Hemorrhage of vascular prosthetic devices, implants and grafts, initial encounter: Secondary | ICD-10-CM | POA: Diagnosis not present

## 2017-12-23 DIAGNOSIS — Z87891 Personal history of nicotine dependence: Secondary | ICD-10-CM | POA: Insufficient documentation

## 2017-12-23 DIAGNOSIS — J181 Lobar pneumonia, unspecified organism: Secondary | ICD-10-CM | POA: Diagnosis not present

## 2017-12-23 DIAGNOSIS — T82898A Other specified complication of vascular prosthetic devices, implants and grafts, initial encounter: Secondary | ICD-10-CM | POA: Diagnosis not present

## 2017-12-23 MED ORDER — DOXYCYCLINE HYCLATE 100 MG PO TABS
100.0000 mg | ORAL_TABLET | Freq: Once | ORAL | Status: AC
  Administered 2017-12-23: 100 mg via ORAL
  Filled 2017-12-23: qty 1

## 2017-12-23 MED ORDER — DOXYCYCLINE HYCLATE 100 MG PO CAPS: 100.0000 mg | ORAL_CAPSULE | Freq: Two times a day (BID) | ORAL | 0 refills | Status: AC

## 2017-12-23 MED ORDER — BENZONATATE 100 MG PO CAPS
100.0000 mg | ORAL_CAPSULE | Freq: Once | ORAL | Status: AC
  Administered 2017-12-23: 100 mg via ORAL
  Filled 2017-12-23: qty 1

## 2017-12-23 NOTE — ED Notes (Signed)
Pt's son will be transporting pt back to [facility]

## 2017-12-23 NOTE — ED Triage Notes (Signed)
Per EMS: pt from [facility], received dialysis treatment today. Dialysis noted bleeding from his dialysis access.  EMS applied pressure dressing to site. No visible bleeding noted at this time.   PTA BP 140/80.

## 2017-12-23 NOTE — ED Notes (Signed)
PTAR contacted to return patient to [facility]

## 2017-12-23 NOTE — ED Notes (Signed)
Attempted report to [facility]  

## 2017-12-23 NOTE — ED Notes (Signed)
Pt states he is having chest pain due to a non productive cough x1 week.

## 2017-12-23 NOTE — ED Notes (Signed)
PTAR cancelled; friend coming

## 2017-12-23 NOTE — ED Provider Notes (Signed)
[facility] EMERGENCY DEPARTMENT Provider Note  CSN: [id number] Arrival date & time: 12/23/17 1809  Chief Complaint(s) No chief complaint on file.  HPI Trevor [name] is a 72 y.o. male with a history of ESRD on dialysis who presents to the emergency department from the dialysis center with bleeding from his AV fistula.  Patient is not on any anticoagulation.  The patient was brought in by EMS who applied pressure and dressed the AV fistula resulting in hemostasis.  Patient denies any pain to the fistula or to the extremity distally.   In addition patient is complaining of 2 weeks of dry cough.  He denies any fevers,  Chills, nausea, vomiting, diarrhea, chest pain, shortness of breath.  Denies any other physical complaints at this time.    HPI  Past Medical History Past Medical History:  Diagnosis Date  . AAA (abdominal aortic aneurysm) ([place])   . Anxiety   . Arthritis   . Blindness   . CHF (congestive heart failure) ([place])   . 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 ESRD on hemodialysis ([place])    started HD 2014-15  . Headache(784.0)   . HTN (hypertension)   . Protein-calorie malnutrition [facility])    Patient Active Problem List   Diagnosis Date Noted  . Sleep stage dysfunction 11/10/2017  . 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 Vascular device, implant, or graft complication 56/25/6389  . Peripheral arterial disease ([place]) 10/20/2017  . Non-allergic rhinitis 10/20/2017  . Pain due to onychomycosis of toenails of both feet 07/30/2017  . 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 BPH (benign prostatic hyperplasia) 07/07/2017  . At risk for adverse drug reaction 07/02/2017  . Ischemia of extremity 06/28/2017  . Pulmonary edema 06/27/2017  . Weakness of both legs 01/25/2017  . Hypertensive urgency 01/24/2017  . 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 Ischemic chest pain 11/01/2016  . Hypoxemia 09/07/2016  .  Essential hypertension 03/08/2016  . Orthostatic hypotension 01/12/2016  . Dyslipidemia 01/12/2016  . Cough   . Weakness generalized 12/26/2015  . Chest pain 08/12/2015  . Accelerated hypertension 08/12/2015  . Carotid stenosis   . Stroke ([place]) 04/03/2015  . Abdominal aortic aneurysm ([place]) 03/06/2015  . Generalized weakness 09/08/2014  . Acute encephalopathy 09/07/2014  . Protein-calorie malnutrition, severe ([place]) 05/20/2014  . Hypokalemia 05/19/2014  . Syncope and collapse 05/19/2014  . End-stage renal disease on hemodialysis ([place]) 05/19/2014  . Anemia of renal disease 05/19/2014  . Coronary artery disease, non-occlusive:  09/28/2012  . Anemia 09/27/2012  . Secondary hyperparathyroidism ([place]) 09/27/2012  . Non compliance with medical treatment 09/27/2012  . Chronic combined systolic and diastolic CHF (congestive heart failure) ([place]) 10/10/2011  . A-fib ([place]) 10/08/2011  . History of stroke 10/07/2011  . Cocaine abuse ([place]) 10/07/2011  . Severe sinus bradycardia 10/07/2011  . Nonischemic cardiomyopathy ([place]) 09/18/2011  . Tobacco use disorder 08/07/2011  . Bruit 08/07/2011   Home Medication(s) Prior to Admission medications   Medication Sig Start Date End Date Taking? Authorizing Provider  Amino Acids-Protein Hydrolys (FEEDING SUPPLEMENT, PRO-STAT SUGAR FREE 64,) LIQD Take 30 mLs by mouth 2 (two) times daily.    [provider]  amLODipine (NORVASC) 10 MG tablet Take 10 mg by mouth at bedtime.  04/13/17   [provider]  aspirin 81 MG chewable tablet Chew 1 tablet (81 mg total) by mouth daily. 11/04/16   Lyda Jester M, PA-C  atorvastatin (LIPITOR) 40  MG tablet Take 40 mg by mouth at bedtime.    [provider]  bisacodyl (DULCOLAX) 10 MG suppository Place 10 mg rectally daily as needed (constipation not relieved by MOM).    [provider]  clopidogrel (PLAVIX) 75 MG tablet Take 1 tablet (75 mg total) by mouth daily. 04/05/15   Barton Dubois, MD  docusate sodium (COLACE) 100 MG capsule Take 200 mg by mouth at bedtime.     [provider]  doxycycline (VIBRAMYCIN) 100 MG capsule Take 1 capsule (100 mg total) by mouth 2 (two) times daily for 5 days. 12/23/17 12/28/17  Fatima Blank, MD  ferric citrate (AURYXIA) 1 GM 210 MG(Fe) tablet Take 630 mg by mouth 3 (three) times daily with meals.     [provider]  fluticasone (FLONASE) 50 MCG/ACT nasal spray Place 1 spray into both nostrils 2 (two) times daily.     [provider]  gabapentin (NEURONTIN) 100 MG capsule Take 1 capsule (100 mg total) by mouth 3 (three) times daily. 04/05/15   Barton Dubois, MD  hydrALAZINE (APRESOLINE) 25 MG tablet Take 25 mg by mouth 3 (three) times daily.     [provider]  isosorbide mononitrate (IMDUR) 30 MG 24 hr tablet Take 30 mg by mouth daily.    [provider]  metoprolol tartrate (LOPRESSOR) 25 MG tablet Take 1 tablet (25 mg total) by mouth 2 (two) times daily. 11/03/16   Lyda Jester M, PA-C  multivitamin (RENA-VIT) TABS tablet Take 1 tablet by mouth at bedtime. 07/02/17   Velvet Bathe, MD  Nutritional Supplements (FEEDING SUPPLEMENT, NEPRO CARB STEADY,) LIQD Take 237 mLs by mouth 2 (two) times daily between meals. 12/11/16   Geradine Girt, DO  Omega-3 Fatty Acids (FISH OIL) 1000 MG CAPS Take 1,000 mg by mouth 2 (two) times daily.    [provider]                                                                                                                                    Past Surgical History Past Surgical History:  Procedure Laterality Date  . AXILLARY-FEMORAL BYPASS GRAFT  06/28/2017   Procedure: BYPASS GRAFT RIGHT AXILLA-BIFEMORAL USING 8MM X 30CM AND 8MM X 60CM HEMASHIELD GOLD GRAFTS;  Surgeon: Rosetta Posner, MD;  Location: [facility];  Service: Vascular;;  . CARDIAC CATHETERIZATION N/A 11/01/2016   Procedure: Left Heart Cath and Coronary Angiography;  Surgeon: Lorretta Harp, MD;  Location: [facility] CV LAB;  Service: Cardiovascular;  Laterality: N/A;  . ENDARTERECTOMY FEMORAL Bilateral 06/28/2017   Procedure: ENDARTERECTOMY BILATERAL FEMORAL ARTERIES;  Surgeon: Rosetta Posner, MD;  Location: [facility];  Service: Vascular;  Laterality: Bilateral;  . INSERTION OF DIALYSIS CATHETER  09/21/2012   Procedure: INSERTION OF DIALYSIS CATHETER;  Surgeon: Angelia Mould, MD;  Location: [facility];  Service: Vascular;  Laterality: N/A;  Right Internal Jugular  Placement  . LEFT HEART CATHETERIZATION WITH CORONARY ANGIOGRAM N/A 09/28/2012   Procedure: LEFT HEART CATHETERIZATION WITH CORONARY ANGIOGRAM;  Surgeon: Sherren Mocha, MD;  Location[facility] CATH LAB;  Service: Cardiovascular;  Laterality: N/A;  . LEFT HEART CATHETERIZATION WITH CORONARY ANGIOGRAM N/A 07/07/2014   Procedure: LEFT HEART CATHETERIZATION WITH CORONARY ANGIOGRAM;  Surgeon: Leonie Man, MD;  Location: [facility] CATH LAB;  Service: Cardiovascular;  Laterality: N/A;  . right hand    . TEE WITHOUT CARDIOVERSION  10/09/2011   Procedure: TRANSESOPHAGEAL ECHOCARDIOGRAM (TEE);  Surgeon: Lelon Perla, MD;  Location: [facility] ENDOSCOPY;  Service: Cardiovascular;  Laterality: N/A;   Family History Family History  Problem Relation Age of Onset  . Heart attack Mother        MI in her 30s  . Diabetes Mother   . Alcohol abuse Father   . Anesthesia problems Neg Hx   . Hypotension Neg Hx   . Malignant hyperthermia Neg Hx   . Pseudochol deficiency Neg Hx     Social History Social History   Tobacco Use  . Smoking status: Former Smoker    Packs/day: 0.50    Years: 50.00    Pack years: 25.00    Types: Cigarettes  . Smokeless tobacco: Never Used  . Tobacco comment: quit 05/2017  Substance Use Topics  . Alcohol use: No    Alcohol/week: 0.0 oz    Comment: Occasional  . Drug use: No   Allergies Patient has no known allergies.  Review of Systems Review of Systems All other systems are reviewed and are negative for  acute change except as noted in the HPI  Physical Exam Vital Signs  I have reviewed the triage vital signs BP 132/68 (BP Location: Right Arm)   Pulse 79   Temp 97.6 F (36.4 C) (Oral)   Resp 18   Ht 5\' 10"  (1.778 m)   Wt 60.2 kg (132 lb 11.5 oz)   SpO2 100%   BMI 19.04 kg/m   Physical Exam  Constitutional: He is oriented to person, place, and time. He appears well-developed and well-nourished. No distress.  HENT:  Head: Normocephalic and atraumatic.  Nose: Nose normal.  Eyes: Conjunctivae and EOM are normal. Pupils are equal, round, and reactive to light. Right eye exhibits no discharge. Left eye exhibits no discharge. No scleral icterus.  Neck: Normal range of motion. Neck supple.  Cardiovascular: Normal rate and regular rhythm. Exam reveals no gallop and no friction rub.  No murmur heard. Pulmonary/Chest: Effort normal and breath sounds normal. No stridor. No respiratory distress. He has no wheezes. He has no rhonchi. He has no rales.  Abdominal: Soft. He exhibits no distension. There is no tenderness.  Musculoskeletal: He exhibits no edema or tenderness.       Arms: Neurological: He is alert and oriented to person, place, and time.  Skin: Skin is warm and dry. No rash noted. He is not diaphoretic. No erythema.  Psychiatric: He has a normal mood and affect.  Vitals reviewed.   ED Results and Treatments Labs (all labs ordered are listed, but only abnormal results are displayed) Labs Reviewed - No data to display  EKG  EKG Interpretation  Date/Time:    Ventricular Rate:    PR Interval:    QRS Duration:   QT Interval:    QTC Calculation:   R Axis:     Text Interpretation:        Radiology Dg Chest 2 View  Result Date: 12/23/2017 CLINICAL DATA:  Cough x3 weeks EXAM: CHEST  2 VIEW COMPARISON:  09/03/2017 FINDINGS: The heart size and mediastinal  contours are within normal limits. Atherosclerotic, tortuous nonaneurysmal aorta. Faint bibasilar pulmonary opacities may reflect minimal pneumonia and/or atelectasis, right greater than left. The visualized skeletal structures are unremarkable. IMPRESSION: Faint bibasilar airspace opacities suspicious for minimal foci of pneumonia, right greater than left. Aortic atherosclerosis. No pulmonary edema, effusion or pneumothorax. Electronically Signed   By: Ashley Royalty M.D.   On: 12/23/2017 19:17   Pertinent labs & imaging results that were available during my care of the patient were reviewed by me and considered in my medical decision making (see chart for details).  Medications Ordered in ED Medications  doxycycline (VIBRA-TABS) tablet 100 mg (not administered)  benzonatate (TESSALON) capsule 100 mg (100 mg Oral Given 12/23/17 1856)                                                                                                                                    Procedures Procedures  (including critical care time)  Medical Decision Making / ED Course I have reviewed the nursing notes for this encounter and the patient's prior records (if available in EHR or on provided paperwork).    1.  Bleeding from the AV fistula appears to be controlled at this time.  Patient was monitored for 2 hours without recurrence of the bleeding.  2.  Cough   CXR with evidence of possible developing PNA in RLL.  Patient is satting well on room air.  He is afebrile with stable vital signs.  We will treat outpatient Abx.   The patient appears reasonably screened and/or stabilized for discharge and I doubt any other medical condition or other [facility] requiring further screening, evaluation, or treatment in the ED at this time prior to discharge.  The patient is safe for discharge with strict return precautions.   Final Clinical Impression(s) / ED Diagnoses Final diagnoses:  Cough  Pneumonia of right lower lobe due  to infectious organism [facility])  Bleeding   Disposition: Discharge  Condition: Good  I have discussed the results, Dx and Tx plan with the patient who expressed understanding and agree(s) with the plan. Discharge instructions discussed at great length. The patient was given strict return precautions who verbalized understanding of the instructions. No further questions at time of discharge.    ED Discharge Orders        Ordered    doxycycline (VIBRAMYCIN) 100 MG capsule  2 times daily     12/23/17 2035       Follow Up:  Clinic, [place] [address] [phone]  Schedule an appointment as soon as possible for a visit  in 5-7 days, As needed      This chart was dictated using voice recognition software.  Despite best efforts to proofread,  errors can occur which can change the documentation meaning.   Fatima Blank, MD 12/23/17 2040

## 2017-12-24 ENCOUNTER — Non-Acute Institutional Stay (SKILLED_NURSING_FACILITY): Payer: Medicare Other | Admitting: Internal Medicine

## 2017-12-24 ENCOUNTER — Encounter: Payer: Self-pay | Admitting: Internal Medicine

## 2017-12-24 DIAGNOSIS — I10 Essential (primary) hypertension: Secondary | ICD-10-CM | POA: Diagnosis not present

## 2017-12-24 DIAGNOSIS — J440 Chronic obstructive pulmonary disease with acute lower respiratory infection: Secondary | ICD-10-CM | POA: Diagnosis not present

## 2017-12-24 DIAGNOSIS — T829XXD Unspecified complication of cardiac and vascular prosthetic device, implant and graft, subsequent encounter: Secondary | ICD-10-CM

## 2017-12-24 DIAGNOSIS — J209 Acute bronchitis, unspecified: Secondary | ICD-10-CM | POA: Diagnosis not present

## 2017-12-24 NOTE — Assessment & Plan Note (Signed)
Nephrology will be notified of this recurrent issue which has resulted in multiple ED visits CBC, platelet count, PT/INR, PTT

## 2017-12-24 NOTE — Progress Notes (Signed)
NURSING HOME LOCATION:  [facility] ROOM NUMBER:  [id number]  CODE STATUS:    PCP:  [name]  [address] [place] [address]  This is a nursing facility follow up for specific acute issue of possible HCAP.  Interim medical record and care since last [facility] visit was updated with review of diagnostic studies and change in clinical status since last visit were documented.  HPI: The patient went to the ED 12/23/17 for bleeding from his HD dialysis cath site. While in the ED he requested a chest x-ray because of a recurrent cough. He states he's had "cough & gagging" for 2-3 weeks. He states that he was told that he had the "beginning stages of pneumonia". The Epic note states that doxycycline was prescribed, no prescription was provided to the [name]. Apparently the prescription was sent to his in town pharmacy rather than to this facility. CXray personally reviewed. Ill defined increased RLL > LLL interstitial markings & possible early infiltrate. He apparently continues to smoke in the [name] courtyard. He describes the production of a tablespoon of yellow sputum daily. He's had sneezing intermittently without other extrinsic symptoms. In addition to the bleeding from the fistula site he also describes some black stool. He had epistaxis related to nasal spray use. This resolved after he discontinued the nasal spray.  Actually he states he's had bleeding from the fistula site "at least 10 times in the last 6 weeks." He states he will go the ED because of bleeding. With application of pressure and dressing AV fistula bleeding resolves. He previously was on Plavix but Dr. Donnetta Hutching stated this was optional and it was discontinued. No other definite bleeding dyscrasias are noted. He has had mild, stable normocytic, normocytic anemia with normal platelet counts.  The patient is presently on a non-rate limiting calcium channel blocker, hydralazine, and a  beta blocker. He has a history of hypertensive urgency.    Review of systems:  Constitutional: No fever,significant weight change, fatigue  Eyes: No redness, discharge, pain, vision change ENT/mouth: No purulent discharge, earache,change in hearing ,sore throat  Cardiovascular: No chest pain, palpitations,paroxysmal nocturnal dyspnea, claudication, edema  Respiratory: No hemoptysis, DOE , significant snoring,apnea   Gastrointestinal: No heartburn,dysphagia,abdominal pain, nausea / vomiting,rectal bleeding, definite melena,change in bowels Genitourinary: No dysuria,hematuria, pyuria,  incontinence, nocturia Musculoskeletal: No joint stiffness, joint swelling, weakness,pain Dermatologic: No rash, pruritus, change in appearance of skin Neurologic: No dizziness,headache,syncope, seizures, numbness , tingling Psychiatric: No significant anxiety , depression, insomnia, anorexia Endocrine: No change in hair/skin/ nails, excessive thirst, excessive hunger, excessive urination  Hematologic/lymphatic: No significant bruising, lymphadenopathy Allergy/immunology: No itchy/ watery eyes, urticaria, angioedema  Physical exam:  Pertinent or positive findings: repeat blood pressure was 160/92. The hypertension and tachycardia was in the context of paroxysms of coughing.  He is thin but appears adequately nourished. He has somewhat of a blank stare. He does have decreased visual acuity. Complete dentures. The right side of the mouth does droop. Breath sounds are decreased. Tachycardia with a grade 1 systolic murmur present. Second heart sound is increased. Pedal pulses are decreased.   General appearance:no acute distress , increased work of breathing is present.   Lymphatic: No lymphadenopathy about the head, neck, axilla . Eyes: No conjunctival inflammation or lid edema is present. There is no scleral icterus. Ears:  External ear exam shows no significant lesions or deformities.   Nose:  External nasal  examination shows no deformity or inflammation. Nasal mucosa are  moist without lesions ,exudates Oral exam: lips and gums are healthy appearing.There is no oropharyngeal erythema or exudate . Neck:  No thyromegaly, masses, tenderness noted.    Heart:  No gallop, click, rub. S1 normal. Lungs: without wheezes, rhonchi,rales , rubs. Abdomen:Bowel sounds are normal. Abdomen is soft and nontender with no organomegaly, hernias,masses. GU: deferred  Extremities:  No cyanosis, clubbing,edema  Neurologic exam : Strength equal  in upper & lower extremities Skin: Warm & dry w/o tenting. No significant lesions or rash.  See summary under each active problem in the Problem List with associated updated therapeutic plan

## 2017-12-25 NOTE — Assessment & Plan Note (Signed)
BP lower on recheck Elevated pressures in context of paroxysmal coughing Monitor to assess need for increased dose or additional meds

## 2017-12-25 NOTE — Patient Instructions (Signed)
See assessment and plan under each diagnosis in the problem list and acutely for this visit 

## 2018-01-11 ENCOUNTER — Other Ambulatory Visit: Payer: Self-pay

## 2018-01-11 ENCOUNTER — Emergency Department (HOSPITAL_COMMUNITY): Payer: Medicare Other

## 2018-01-11 ENCOUNTER — Encounter (HOSPITAL_COMMUNITY): Payer: Self-pay

## 2018-01-11 ENCOUNTER — Inpatient Hospital Stay (HOSPITAL_COMMUNITY)
Admission: EM | Admit: 2018-01-11 | Discharge: 2018-01-14 | DRG: 291 | Disposition: A | Discharge status: Skilled Nursing Facility | Payer: Medicare Other | Attending: Oncology | Admitting: Oncology

## 2018-01-11 DIAGNOSIS — J9600 Acute respiratory failure, unspecified whether with hypoxia or hypercapnia: Secondary | ICD-10-CM | POA: Diagnosis not present

## 2018-01-11 DIAGNOSIS — M21371 Foot drop, right foot: Secondary | ICD-10-CM | POA: Diagnosis not present

## 2018-01-11 DIAGNOSIS — J810 Acute pulmonary edema: Secondary | ICD-10-CM | POA: Diagnosis not present

## 2018-01-11 DIAGNOSIS — J208 Acute bronchitis due to other specified organisms: Secondary | ICD-10-CM | POA: Diagnosis present

## 2018-01-11 DIAGNOSIS — Z720 Tobacco use: Secondary | ICD-10-CM | POA: Diagnosis present

## 2018-01-11 DIAGNOSIS — J440 Chronic obstructive pulmonary disease with acute lower respiratory infection: Secondary | ICD-10-CM | POA: Diagnosis present

## 2018-01-11 DIAGNOSIS — I43 Cardiomyopathy in diseases classified elsewhere: Secondary | ICD-10-CM | POA: Diagnosis not present

## 2018-01-11 DIAGNOSIS — I2510 Atherosclerotic heart disease of native coronary artery without angina pectoris: Secondary | ICD-10-CM | POA: Diagnosis present

## 2018-01-11 DIAGNOSIS — M79671 Pain in right foot: Secondary | ICD-10-CM | POA: Diagnosis present

## 2018-01-11 DIAGNOSIS — H5440 Blindness, one eye, unspecified eye: Secondary | ICD-10-CM | POA: Diagnosis present

## 2018-01-11 DIAGNOSIS — I5043 Acute on chronic combined systolic (congestive) and diastolic (congestive) heart failure: Secondary | ICD-10-CM | POA: Diagnosis not present

## 2018-01-11 DIAGNOSIS — D631 Anemia in chronic kidney disease: Secondary | ICD-10-CM | POA: Diagnosis present

## 2018-01-11 DIAGNOSIS — I120 Hypertensive chronic kidney disease with stage 5 chronic kidney disease or end stage renal disease: Secondary | ICD-10-CM | POA: Diagnosis not present

## 2018-01-11 DIAGNOSIS — E785 Hyperlipidemia, unspecified: Secondary | ICD-10-CM | POA: Diagnosis present

## 2018-01-11 DIAGNOSIS — E8889 Other specified metabolic disorders: Secondary | ICD-10-CM | POA: Diagnosis present

## 2018-01-11 DIAGNOSIS — Z95820 Peripheral vascular angioplasty status with implants and grafts: Secondary | ICD-10-CM

## 2018-01-11 DIAGNOSIS — I132 Hypertensive heart and chronic kidney disease with heart failure and with stage 5 chronic kidney disease, or end stage renal disease: Secondary | ICD-10-CM | POA: Diagnosis not present

## 2018-01-11 DIAGNOSIS — Z992 Dependence on renal dialysis: Secondary | ICD-10-CM

## 2018-01-11 DIAGNOSIS — I739 Peripheral vascular disease, unspecified: Secondary | ICD-10-CM | POA: Diagnosis present

## 2018-01-11 DIAGNOSIS — Z993 Dependence on wheelchair: Secondary | ICD-10-CM

## 2018-01-11 DIAGNOSIS — Z7984 Long term (current) use of oral hypoglycemic drugs: Secondary | ICD-10-CM | POA: Diagnosis not present

## 2018-01-11 DIAGNOSIS — E8770 Fluid overload, unspecified: Secondary | ICD-10-CM | POA: Diagnosis not present

## 2018-01-11 DIAGNOSIS — Z7951 Long term (current) use of inhaled steroids: Secondary | ICD-10-CM

## 2018-01-11 DIAGNOSIS — G8929 Other chronic pain: Secondary | ICD-10-CM | POA: Diagnosis present

## 2018-01-11 DIAGNOSIS — I69398 Other sequelae of cerebral infarction: Secondary | ICD-10-CM

## 2018-01-11 DIAGNOSIS — E1151 Type 2 diabetes mellitus with diabetic peripheral angiopathy without gangrene: Secondary | ICD-10-CM | POA: Diagnosis not present

## 2018-01-11 DIAGNOSIS — E1122 Type 2 diabetes mellitus with diabetic chronic kidney disease: Secondary | ICD-10-CM | POA: Diagnosis not present

## 2018-01-11 DIAGNOSIS — Z7902 Long term (current) use of antithrombotics/antiplatelets: Secondary | ICD-10-CM

## 2018-01-11 DIAGNOSIS — H543 Unqualified visual loss, both eyes: Secondary | ICD-10-CM | POA: Diagnosis not present

## 2018-01-11 DIAGNOSIS — M6281 Muscle weakness (generalized): Secondary | ICD-10-CM | POA: Diagnosis not present

## 2018-01-11 DIAGNOSIS — Z79899 Other long term (current) drug therapy: Secondary | ICD-10-CM | POA: Diagnosis not present

## 2018-01-11 DIAGNOSIS — I428 Other cardiomyopathies: Secondary | ICD-10-CM

## 2018-01-11 DIAGNOSIS — R0602 Shortness of breath: Secondary | ICD-10-CM | POA: Diagnosis not present

## 2018-01-11 DIAGNOSIS — I429 Cardiomyopathy, unspecified: Secondary | ICD-10-CM | POA: Diagnosis not present

## 2018-01-11 DIAGNOSIS — I10 Essential (primary) hypertension: Secondary | ICD-10-CM | POA: Diagnosis present

## 2018-01-11 DIAGNOSIS — I714 Abdominal aortic aneurysm, without rupture: Secondary | ICD-10-CM | POA: Diagnosis not present

## 2018-01-11 DIAGNOSIS — R200 Anesthesia of skin: Secondary | ICD-10-CM | POA: Diagnosis not present

## 2018-01-11 DIAGNOSIS — F17210 Nicotine dependence, cigarettes, uncomplicated: Secondary | ICD-10-CM | POA: Diagnosis present

## 2018-01-11 DIAGNOSIS — I501 Left ventricular failure: Secondary | ICD-10-CM | POA: Diagnosis not present

## 2018-01-11 DIAGNOSIS — M1990 Unspecified osteoarthritis, unspecified site: Secondary | ICD-10-CM | POA: Diagnosis not present

## 2018-01-11 DIAGNOSIS — E875 Hyperkalemia: Secondary | ICD-10-CM | POA: Diagnosis present

## 2018-01-11 DIAGNOSIS — J9691 Respiratory failure, unspecified with hypoxia: Secondary | ICD-10-CM | POA: Diagnosis not present

## 2018-01-11 DIAGNOSIS — I4891 Unspecified atrial fibrillation: Secondary | ICD-10-CM | POA: Diagnosis not present

## 2018-01-11 DIAGNOSIS — J9601 Acute respiratory failure with hypoxia: Secondary | ICD-10-CM | POA: Diagnosis not present

## 2018-01-11 DIAGNOSIS — N186 End stage renal disease: Secondary | ICD-10-CM | POA: Diagnosis not present

## 2018-01-11 DIAGNOSIS — Z7982 Long term (current) use of aspirin: Secondary | ICD-10-CM

## 2018-01-11 DIAGNOSIS — J209 Acute bronchitis, unspecified: Secondary | ICD-10-CM

## 2018-01-11 DIAGNOSIS — R488 Other symbolic dysfunctions: Secondary | ICD-10-CM | POA: Diagnosis not present

## 2018-01-11 DIAGNOSIS — N2581 Secondary hyperparathyroidism of renal origin: Secondary | ICD-10-CM | POA: Diagnosis present

## 2018-01-11 DIAGNOSIS — I679 Cerebrovascular disease, unspecified: Secondary | ICD-10-CM | POA: Diagnosis not present

## 2018-01-11 DIAGNOSIS — I5042 Chronic combined systolic (congestive) and diastolic (congestive) heart failure: Secondary | ICD-10-CM | POA: Diagnosis not present

## 2018-01-11 DIAGNOSIS — I509 Heart failure, unspecified: Secondary | ICD-10-CM | POA: Diagnosis not present

## 2018-01-11 DIAGNOSIS — R0902 Hypoxemia: Secondary | ICD-10-CM

## 2018-01-11 DIAGNOSIS — H547 Unspecified visual loss: Secondary | ICD-10-CM

## 2018-01-11 DIAGNOSIS — N189 Chronic kidney disease, unspecified: Secondary | ICD-10-CM | POA: Diagnosis present

## 2018-01-11 DIAGNOSIS — R2681 Unsteadiness on feet: Secondary | ICD-10-CM | POA: Diagnosis not present

## 2018-01-11 LAB — CBC WITH DIFFERENTIAL/PLATELET
BASOS ABS: 0.1 10*3/uL (ref 0.0–0.1)
BASOS PCT: 1 %
EOS ABS: 0.3 10*3/uL (ref 0.0–0.7)
Eosinophils Relative: 4 %
HCT: 29.9 % — ABNORMAL LOW (ref 39.0–52.0)
Hemoglobin: 10.2 g/dL — ABNORMAL LOW (ref 13.0–17.0)
Lymphocytes Relative: 17 %
Lymphs Abs: 1.4 10*3/uL (ref 0.7–4.0)
MCH: 29.3 pg (ref 26.0–34.0)
MCHC: 34.1 g/dL (ref 30.0–36.0)
MCV: 85.9 fL (ref 78.0–100.0)
MONO ABS: 0.7 10*3/uL (ref 0.1–1.0)
MONOS PCT: 9 %
Neutro Abs: 5.7 10*3/uL (ref 1.7–7.7)
Neutrophils Relative %: 69 %
Platelets: 202 10*3/uL (ref 150–400)
RBC: 3.48 MIL/uL — ABNORMAL LOW (ref 4.22–5.81)
RDW: 14.2 % (ref 11.5–15.5)
WBC: 8.2 10*3/uL (ref 4.0–10.5)

## 2018-01-11 LAB — INFLUENZA PANEL BY PCR (TYPE A & B)
INFLAPCR: NEGATIVE
INFLBPCR: NEGATIVE

## 2018-01-11 LAB — COMPREHENSIVE METABOLIC PANEL
ALBUMIN: 3.1 g/dL — AB (ref 3.5–5.0)
ALT: 12 U/L — ABNORMAL LOW (ref 17–63)
ANION GAP: 17 — AB (ref 5–15)
AST: 17 U/L (ref 15–41)
Alkaline Phosphatase: 81 U/L (ref 38–126)
BUN: 56 mg/dL — AB (ref 6–20)
CHLORIDE: 96 mmol/L — AB (ref 101–111)
CO2: 21 mmol/L — AB (ref 22–32)
Calcium: 9 mg/dL (ref 8.9–10.3)
Creatinine, Ser: 9.46 mg/dL — ABNORMAL HIGH (ref 0.61–1.24)
GFR calc Af Amer: 6 mL/min — ABNORMAL LOW (ref >60)
GFR calc non Af Amer: 5 mL/min — ABNORMAL LOW (ref >60)
GLUCOSE: 84 mg/dL (ref 65–99)
POTASSIUM: 5.5 mmol/L — AB (ref 3.5–5.1)
SODIUM: 134 mmol/L — AB (ref 135–145)
Total Bilirubin: 0.9 mg/dL (ref 0.3–1.2)
Total Protein: 6.4 g/dL — ABNORMAL LOW (ref 6.5–8.1)

## 2018-01-11 LAB — I-STAT CG4 LACTIC ACID, ED: Lactic Acid, Venous: 0.57 mmol/L (ref 0.5–1.9)

## 2018-01-11 LAB — TROPONIN I: Troponin I: 0.03 ng/mL (ref <0.03)

## 2018-01-11 MED ORDER — ISOSORBIDE MONONITRATE ER 30 MG PO TB24
30.0000 mg | ORAL_TABLET | Freq: Every day | ORAL | Status: DC
  Administered 2018-01-12 – 2018-01-14 (×3): 30 mg via ORAL
  Filled 2018-01-11 (×3): qty 1

## 2018-01-11 MED ORDER — RENA-VITE PO TABS
1.0000 | ORAL_TABLET | Freq: Every day | ORAL | Status: DC
  Administered 2018-01-11 – 2018-01-13 (×3): 1 via ORAL
  Filled 2018-01-11 (×3): qty 1

## 2018-01-11 MED ORDER — AMLODIPINE BESYLATE 10 MG PO TABS
10.0000 mg | ORAL_TABLET | Freq: Every day | ORAL | Status: DC
  Administered 2018-01-11 – 2018-01-13 (×3): 10 mg via ORAL
  Filled 2018-01-11 (×3): qty 1

## 2018-01-11 MED ORDER — IPRATROPIUM-ALBUTEROL 0.5-2.5 (3) MG/3ML IN SOLN
3.0000 mL | Freq: Four times a day (QID) | RESPIRATORY_TRACT | Status: DC
Start: 2018-01-12 — End: 2018-01-12
  Administered 2018-01-12: 3 mL via RESPIRATORY_TRACT
  Filled 2018-01-11: qty 3

## 2018-01-11 MED ORDER — DOXERCALCIFEROL 4 MCG/2ML IV SOLN
2.0000 ug | INTRAVENOUS | Status: DC
  Filled 2018-01-11: qty 2

## 2018-01-11 MED ORDER — ASPIRIN 81 MG PO CHEW
81.0000 mg | CHEWABLE_TABLET | Freq: Every day | ORAL | Status: DC
  Administered 2018-01-11 – 2018-01-14 (×4): 81 mg via ORAL
  Filled 2018-01-11 (×4): qty 1

## 2018-01-11 MED ORDER — PRO-STAT SUGAR FREE PO LIQD
30.0000 mL | Freq: Two times a day (BID) | ORAL | Status: DC
  Administered 2018-01-11 – 2018-01-14 (×6): 30 mL via ORAL
  Filled 2018-01-11 (×6): qty 30

## 2018-01-11 MED ORDER — CLOPIDOGREL BISULFATE 75 MG PO TABS
75.0000 mg | ORAL_TABLET | Freq: Every day | ORAL | Status: DC
  Administered 2018-01-12 – 2018-01-14 (×3): 75 mg via ORAL
  Filled 2018-01-11 (×4): qty 1

## 2018-01-11 MED ORDER — ATORVASTATIN CALCIUM 40 MG PO TABS
40.0000 mg | ORAL_TABLET | Freq: Every day | ORAL | Status: DC
  Administered 2018-01-11 – 2018-01-13 (×3): 40 mg via ORAL
  Filled 2018-01-11 (×3): qty 1

## 2018-01-11 MED ORDER — IPRATROPIUM-ALBUTEROL 0.5-2.5 (3) MG/3ML IN SOLN
3.0000 mL | Freq: Four times a day (QID) | RESPIRATORY_TRACT | Status: DC
  Administered 2018-01-11: 3 mL via RESPIRATORY_TRACT
  Filled 2018-01-11: qty 3

## 2018-01-11 MED ORDER — FLUTICASONE PROPIONATE 50 MCG/ACT NA SUSP
1.0000 | Freq: Two times a day (BID) | NASAL | Status: DC
  Administered 2018-01-12 – 2018-01-14 (×5): 1 via NASAL
  Filled 2018-01-11: qty 16

## 2018-01-11 MED ORDER — METOPROLOL TARTRATE 25 MG PO TABS
25.0000 mg | ORAL_TABLET | Freq: Two times a day (BID) | ORAL | Status: DC
  Administered 2018-01-12 – 2018-01-14 (×5): 25 mg via ORAL
  Filled 2018-01-11 (×7): qty 1

## 2018-01-11 MED ORDER — HEPARIN SODIUM (PORCINE) 5000 UNIT/ML IJ SOLN
5000.0000 [IU] | Freq: Three times a day (TID) | INTRAMUSCULAR | Status: DC
  Administered 2018-01-12 – 2018-01-14 (×8): 5000 [IU] via SUBCUTANEOUS
  Filled 2018-01-11 (×8): qty 1

## 2018-01-11 MED ORDER — HYDRALAZINE HCL 25 MG PO TABS
25.0000 mg | ORAL_TABLET | Freq: Three times a day (TID) | ORAL | Status: DC
  Administered 2018-01-11 – 2018-01-14 (×9): 25 mg via ORAL
  Filled 2018-01-11 (×9): qty 1

## 2018-01-11 MED ORDER — DOCUSATE SODIUM 100 MG PO CAPS
200.0000 mg | ORAL_CAPSULE | Freq: Every day | ORAL | Status: DC
  Administered 2018-01-11 – 2018-01-13 (×3): 200 mg via ORAL
  Filled 2018-01-11 (×3): qty 2

## 2018-01-11 MED ORDER — BISACODYL 10 MG RE SUPP: 10.0000 mg | Freq: Every day | RECTAL | Status: DC | PRN

## 2018-01-11 MED ORDER — GABAPENTIN 100 MG PO CAPS
100.0000 mg | ORAL_CAPSULE | Freq: Three times a day (TID) | ORAL | Status: DC
  Administered 2018-01-11 – 2018-01-13 (×7): 100 mg via ORAL
  Filled 2018-01-11 (×7): qty 1

## 2018-01-11 NOTE — H&P (Signed)
Date: 01/11/2018               Patient Name:  Trevor [name] MRN: [id number]  DOB: 06-Dec-1945 Age / Sex: 72 y.o., male   PCP: [name]         Medical Service: Internal Medicine Teaching Service         Attending Physician: Dr. Blanchie Dessert, MD    First Contact: Dr. Ronalee [name] Pager: [phone]  Second Contact: Dr. Philipp [name] Pager: [phone]       After Hours (After 5p/  First Contact Pager: [phone]  weekends / holidays): Second Contact Pager: [phone]   Chief Complaint: shortness of breath, congestion  History of Present Illness:  Trevor [name] is a 72yo male with PMH of ESRD on HD MWF, HTN, diabetes, AAA, HFrEF (TTE 2017 with LVEF 45-50%, grade 1 DD), CAD, CVA c/b partial blindness, atrial fibrillation, and tobacco use disorder who presents from [name] with increasing congestion and shortness of breath for the last couple weeks.  For the last couple weeks, he endorses sore throat, congestion, myalgias, subjective fevers, cough productive of NB white sputum, DOE, and PND. He denies orthopnea. He also endorses central sharp chest pain associated with nausea and left arm numbness that resolves with certain positional changes. He saw a physician who treated him for possible HCAP with doxycycline. He states he has tried lozenges for the sore throat. He denies sick contacts or recent travel. He did get the flu shot this year.  Last dialysis session Friday 2/8. Did not receive dialysis today. He smokes 2-3 cigarettes per day. Denies alcohol or other illicit drugs. Lives at [name] where he receives assistance with meal prep, medications, and personal hygiene. He states he is typically non-ambulatory and uses a wheelchair to get around. He has a sister in the area.  He also endorses abdominal pain, NB diarrhea, headache, chronic R foot numbness, and decreased PO intake. He denies a known diagnosis of COPD. He has not taken his medications since yesterday.  ED  Course: - BP 171/74, HR 87, RR 19, temp 98.2, O2 96% on 2L [place] - Hb 10.2. CMP with K 5.5, bicarb 21, BUN 56, Cr 9.46. Lactic acid 0.57. Trop 0.03 - CXR with diffuse pulmonary interstitial edema, small L pleural effusion, no focal consolidation. EKG with sinus rhythm, left atrial enlargement, LVH, no STEMI. - Nephrology consulted by EDP and will take him for dialysis today  Meds:  Current Meds  Medication Sig  . Amino Acids-Protein Hydrolys (FEEDING SUPPLEMENT, PRO-STAT SUGAR FREE 64,) LIQD Take 30 mLs by mouth 2 (two) times daily.  Marland Kitchen amLODipine (NORVASC) 10 MG tablet Take 10 mg by mouth at bedtime.   Marland Kitchen aspirin 81 MG chewable tablet Chew 1 tablet (81 mg total) by mouth daily.  Marland Kitchen atorvastatin (LIPITOR) 40 MG tablet Take 40 mg by mouth at bedtime.  . clopidogrel (PLAVIX) 75 MG tablet Take 1 tablet (75 mg total) by mouth daily.  Marland Kitchen docusate sodium (COLACE) 100 MG capsule Take 200 mg by mouth at bedtime.   . ferric citrate (AURYXIA) 1 GM 210 MG(Fe) tablet Take 630 mg by mouth 3 (three) times daily with meals.   . fluticasone (FLONASE) 50 MCG/ACT nasal spray Place 1 spray into both nostrils 2 (two) times daily.   Marland Kitchen gabapentin (NEURONTIN) 100 MG capsule Take 1 capsule (100 mg total) by mouth 3 (three) times daily.  . hydrALAZINE (APRESOLINE) 25 MG tablet Take 25 mg by mouth 3 (  three) times daily.   . isosorbide mononitrate (IMDUR) 30 MG 24 hr tablet Take 30 mg by mouth daily.  . metoprolol tartrate (LOPRESSOR) 25 MG tablet Take 1 tablet (25 mg total) by mouth 2 (two) times daily.  . multivitamin (RENA-VIT) TABS tablet Take 1 tablet by mouth at bedtime.  . Nutritional Supplements (FEEDING SUPPLEMENT, NEPRO CARB STEADY,) LIQD Take 237 mLs by mouth 2 (two) times daily between meals.  . Omega-3 Fatty Acids (FISH OIL) 1000 MG CAPS Take 1,000 mg by mouth 2 (two) times daily.   Allergies: Allergies as of 01/11/2018  . (No Known Allergies)   Past Medical History:  Diagnosis Date  . AAA (abdominal  aortic aneurysm) ([place])   . Anxiety   . Arthritis   . Blindness   . CHF (congestive heart failure) ([place])   . 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 ESRD on hemodialysis ([place])    started HD 2014-15  . Headache(784.0)   . HTN (hypertension)   . Protein-calorie malnutrition ([place])    Family History:  Family History  Problem Relation Age of Onset  . Heart attack Mother        MI in her 31s  . Diabetes Mother   . Alcohol abuse Father   . Anesthesia problems Neg Hx   . Hypotension Neg Hx   . Malignant hyperthermia Neg Hx   . Pseudochol deficiency Neg Hx    Social History:  He smokes 2-3 cigarettes per day. Denies alcohol or other illicit drugs. [name] where he receives assistance with meal prep, medications, and personal hygiene. He states he is typically non-ambulatory and uses a wheelchair to get around. He has a sister in the area.  Review of Systems: A complete ROS was negative except as per HPI.  Physical Exam: Blood pressure (!) 187/84, pulse 97, temperature 98.2 F (36.8 C), temperature source Oral, resp. rate (!) 23, height 5\' 10"  (1.778 m), weight 130 lb (59 kg), SpO2 92 %. GEN: Well-nourished male lying in bed. Appears uncomfortable. Alert and oriented. No acute distress. HENT: [place]/AT. Moist mucous membranes. No visible lesions. No erythema in posterior pharynx. 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 Sclera non-icteric. Conjunctiva clear. RESP: Crackles in R>L lung base. Wheezes in anterior lung fields. Decreased air movement. Mildly increased work of breathing, on 4L [place]. CV: Normal rate and regular rhythm. Systolic murmur, best heard at LUSB. No LE edema. ABD: Soft. Non-tender. Non-distended. Normoactive bowel sounds. EXT: No edema. Warm and well perfused. Capillary refill <2 sec. LUE AVF with +thrill. NEURO: Cranial nerves II-XII grossly intact. Able to lift all four extremities against  gravity. Chronic right foot drop and numbness. No apparent audiovisual hallucinations. Speech fluent and appropriate.  PSYCH: Patient is calm and pleasant. Appropriate affect. Well-groomed; speech is appropriate and on-subject.  Labs CBC Latest Ref Rng & Units 01/11/2018 11/03/2017 11/02/2017  WBC 4.0 - 10.5 K/uL 8.2 6.8 6.6  Hemoglobin 13.0 - 17.0 g/dL 10.2(L) 11.2(L) 10.8(L)  Hematocrit 39.0 - 52.0 % 29.9(L) 33.2(L) 31.7(L)  Platelets 150 - 400 K/uL 202 212 201   CMP Latest Ref Rng & Units 01/11/2018 11/03/2017 11/02/2017  Glucose 65 - 99 mg/dL 84 91 84  BUN 6 - 20 mg/dL 56(H) 23(H) 45(H)  Creatinine 0.61 - 1.24 mg/dL 9.46(H) 4.77(H) 7.80(H)  Sodium 135 - 145 mmol/L 134(L) 136 136  Potassium 3.5 - 5.1 mmol/L 5.5(H) 3.4(L)  4.4  Chloride 101 - 111 mmol/L 96(L) 96(L) 98(L)  CO2 22 - 32 mmol/L 21(L) 26 25  Calcium 8.9 - 10.3 mg/dL 9.0 9.4 9.8  Total Protein 6.5 - 8.1 g/dL 6.4(L) - -  Total Bilirubin 0.3 - 1.2 mg/dL 0.9 - -  Alkaline Phos 38 - 126 U/L 81 - -  AST 15 - 41 U/L 17 - -  ALT 17 - 63 U/L 12(L) - -   Trop 0.03 Lactic acid 0.57  EKG: personally reviewed my interpretation is sinus rhythm, left atrial enlargement, LVH, no STEMI  CXR: personally reviewed my interpretation is diffuse pulmonary interstitial edema, small L pleural effusion, no focal consolidation  Assessment & Plan by Problem: Active Problems:   * No active hospital problems. *  Mr. Thivierge is a 72yo male with PMH of ESRD on HD MWF, HTN, diabetes, AAA, HFrEF (TTE 2017 with LVEF 45-50%, grade 1 DD), CAD, CVA c/b partial blindness, atrial fibrillation, and tobacco use disorder who presents from [facility] with several weeks of worsening respiratory infectious symptoms, with new oxygen requirement and volume overload.  Acute hypoxic respiratory failure Worsening SOB and congestion for the last several weeks. Sharp chest pain sounds pleuritic in nature. Hypoxic to low 80s, now requiring oxygen.  Differential includes flu/viral illness vs HFrEF exacerbation vs COPD exacerbation vs less likely HCAP. Completed a course of doxycycline recently (1/24-1/31) for possible HCAP. No leukocytosis, afebrile, and CXR without evidence of consolidation, however does have infectious s/s. No sick contacts or recent travel. Crackles in R>L lung base and CXR with increased diffuse pulmonary interstitial edema and small L pleural effusion. Has a history of HFrEF and has not missed a dialysis session. Lung exam also notable for decreased air movement. - Admit to telemetry - Check flu panel - Droplet precautions - HD as below - Duonebs 33mL q6h - Daily weights - Fluticasone 1 spray BID - TTE  ESRD with HD MWF K 5.5, Cr 9.46, BUN 56. Last session 2/8. Did not go to dialysis today. - Nephrology consulted; appreciate their assistance - Will get dialysis today - BMP in AM  HTN Atrial fibrillation Home regimen includes amlodipine 10mg  daily, imdur 30mg  daily, hydralazine 25mg  TID, and metoprolol 25mg  BID. Currently in NSR. BP elevated to 187/84, HR 97, likely exacerbated by volume overload - Telemetry - Continue home amlodipine, imdur, hydralazine, and metoprolol - Dialysis as above  CAD Troponin mildly elevated 0.03, but hx of ESRD. Home regimen includes aspirin 81mg  and plavix 75mg  daily, and atorvastatin 40mg  QHS. Most recent cath  - Continue home aspirin and plavix - Continue home atorvastatin  Diet: Renal VTE PPx: SQH Code Status: Full code Dispo: Admit patient to Inpatient with expected length of stay greater than 2 midnights.  Signed: Colbert Ewing, MD 01/11/2018, 3:33 PM  Pager: Mamie Nick [phone]

## 2018-01-11 NOTE — Progress Notes (Signed)
HD tx completed @ 2054 w/ bp issues throughout, UF goal not met, blood rinsed back, VSS at end, report called to Lorenso Courier, RN

## 2018-01-11 NOTE — Consult Note (Signed)
[facility] Renal Consultation Note  Indication for Consultation:  Management of ESRD/hemodialysis; anemia, hypertension/volume and secondary hyperparathyroidism  HPI: [name] is a 72 y.o. male with ESRD chronic hd MWF  Last tx  On schedule 01/08/18 co sob, dry cough myalgias, subjective fevers,  DOE, and PND.Lives at [facility] ,smoked 2-3 cigarettes /day ,had ho CAD and Right leg Aterial ischemia with Right axillofemoral Bypass grafting 7/29- 07/02/17 . Reports some CP and L arm pain improves with positioning , some Nausea no vomiting  But rare diarhea chronic Right foot pain  . In Er  bp 171/74, O2 sat dropping 88%  HGb 10.2, K 5.5 CXR= diffuse pulmonary interstitial edema, small L pleural effusion, no focal consolidation.     We are consulted for HD /esrd issues and plan for emergent hd now.  Past Medical History:  Diagnosis Date  . AAA (abdominal aortic aneurysm) ([place])   . Anxiety   . Arthritis   . Blindness   . CHF (congestive heart failure) ([place])   . 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 ESRD on hemodialysis ([place])    started HD 2014-15  . Headache(784.0)   . HTN (hypertension)   . Protein-calorie malnutrition ([place])     Past Surgical History:  Procedure Laterality Date  . AXILLARY-FEMORAL BYPASS GRAFT  06/28/2017   Procedure: BYPASS GRAFT RIGHT AXILLA-BIFEMORAL USING 8MM X 30CM AND 8MM X 60CM HEMASHIELD GOLD GRAFTS;  Surgeon: Rosetta Posner, MD;  Location: [facility];  Service: Vascular;;  . CARDIAC CATHETERIZATION N/A 11/01/2016   Procedure: Left Heart Cath and Coronary Angiography;  Surgeon: Lorretta Harp, MD;  Location: [facility] CV LAB;  Service: Cardiovascular;  Laterality: N/A;  . ENDARTERECTOMY FEMORAL Bilateral 06/28/2017   Procedure: ENDARTERECTOMY BILATERAL FEMORAL ARTERIES;  Surgeon: Rosetta Posner, MD;  Location: [facility];  Service: Vascular;  Laterality: Bilateral;  . INSERTION OF DIALYSIS CATHETER   09/21/2012   Procedure: INSERTION OF DIALYSIS CATHETER;  Surgeon: Angelia Mould, MD;  Location: [facility];  Service: Vascular;  Laterality: N/A;  Right Internal Jugular Placement  . LEFT HEART CATHETERIZATION WITH CORONARY ANGIOGRAM N/A 09/28/2012   Procedure: LEFT HEART CATHETERIZATION WITH CORONARY ANGIOGRAM;  Surgeon: Sherren Mocha, MD;  Location: [facility] CATH LAB;  Service: Cardiovascular;  Laterality: N/A;  . LEFT HEART CATHETERIZATION WITH CORONARY ANGIOGRAM N/A 07/07/2014   Procedure: LEFT HEART CATHETERIZATION WITH CORONARY ANGIOGRAM;  Surgeon: Leonie Man, MD;  Location: [facility] CATH LAB;  Service: Cardiovascular;  Laterality: N/A;  . right hand    . TEE WITHOUT CARDIOVERSION  10/09/2011   Procedure: TRANSESOPHAGEAL ECHOCARDIOGRAM (TEE);  Surgeon: Lelon Perla, MD;  Location: [facility] ENDOSCOPY;  Service: Cardiovascular;  Laterality: N/A;      Family History  Problem Relation Age of Onset  . Heart attack Mother        MI in her 5s  . Diabetes Mother   . Alcohol abuse Father   . Anesthesia problems Neg Hx   . Hypotension Neg Hx   . Malignant hyperthermia Neg Hx   . Pseudochol deficiency Neg Hx       reports that he has quit smoking. His smoking use included cigarettes. He has a 25.00 pack-year smoking history. he has never used smokeless tobacco. He reports that he does not drink alcohol or use drugs.  No Known Allergies  Prior to Admission medications   Medication Sig Start Date End  Date Taking? Authorizing Provider  Amino Acids-Protein Hydrolys (FEEDING SUPPLEMENT, PRO-STAT SUGAR FREE 64,) LIQD Take 30 mLs by mouth 2 (two) times daily.   Yes [provider]  amLODipine (NORVASC) 10 MG tablet Take 10 mg by mouth at bedtime.  04/13/17  Yes [provider]  aspirin 81 MG chewable tablet Chew 1 tablet (81 mg total) by mouth daily. 11/04/16  Yes Lyda Jester M, PA-C  atorvastatin (LIPITOR) 40 MG tablet Take 40 mg by mouth at bedtime.   Yes [provider]  clopidogrel (PLAVIX) 75 MG tablet Take 1 tablet (75 mg total) by mouth daily. 04/05/15  Yes Barton Dubois, MD  docusate sodium (COLACE) 100 MG capsule Take 200 mg by mouth at bedtime.    Yes [provider]  ferric citrate (AURYXIA) 1 GM 210 MG(Fe) tablet Take 630 mg by mouth 3 (three) times daily with meals.    Yes [provider]  fluticasone (FLONASE) 50 MCG/ACT nasal spray Place 1 spray into both nostrils 2 (two) times daily.    Yes [provider]  gabapentin (NEURONTIN) 100 MG capsule Take 1 capsule (100 mg total) by mouth 3 (three) times daily. 04/05/15  Yes Barton Dubois, MD  hydrALAZINE (APRESOLINE) 25 MG tablet Take 25 mg by mouth 3 (three) times daily.    Yes [provider]  isosorbide mononitrate (IMDUR) 30 MG 24 hr tablet Take 30 mg by mouth daily.   Yes [provider]  metoprolol tartrate (LOPRESSOR) 25 MG tablet Take 1 tablet (25 mg total) by mouth 2 (two) times daily. 11/03/16  Yes Lyda Jester M, PA-C  multivitamin (RENA-VIT) TABS tablet Take 1 tablet by mouth at bedtime. 07/02/17  Yes Velvet Bathe, MD  Nutritional Supplements (FEEDING SUPPLEMENT, NEPRO CARB STEADY,) LIQD Take 237 mLs by mouth 2 (two) times daily between meals. 12/11/16  Yes Vann, Tomi Bamberger, DO  Omega-3 Fatty Acids (FISH OIL) 1000 MG CAPS Take 1,000 mg by mouth 2 (two) times daily.   Yes [provider]  bisacodyl (DULCOLAX) 10 MG suppository Place 10 mg rectally daily as needed (constipation not relieved by MOM).    [provider]      Results for orders placed or performed during the hospital encounter of 01/11/18 (from the past 48 hour(s))  CBC with Differential     Status: Abnormal   Collection Time: 01/11/18 11:50 AM  Result Value Ref Range   WBC 8.2 4.0 - 10.5 K/uL   RBC 3.48 (L) 4.22 - 5.81 MIL/uL   Hemoglobin 10.2 (L) 13.0 - 17.0 g/dL   HCT 29.9 (L) 39.0 - 52.0 %   MCV 85.9 78.0 - 100.0 fL   MCH 29.3 26.0 - 34.0 pg   MCHC 34.1  30.0 - 36.0 g/dL   RDW 14.2 11.5 - 15.5 %   Platelets 202 150 - 400 K/uL   Neutrophils Relative % 69 %   Neutro Abs 5.7 1.7 - 7.7 K/uL   Lymphocytes Relative 17 %   Lymphs Abs 1.4 0.7 - 4.0 K/uL   Monocytes Relative 9 %   Monocytes Absolute 0.7 0.1 - 1.0 K/uL   Eosinophils Relative 4 %   Eosinophils Absolute 0.3 0.0 - 0.7 K/uL   Basophils Relative 1 %   Basophils Absolute 0.1 0.0 - 0.1 K/uL    Comment: Performed at [facility], [address] [place], [place] [address]  Comprehensive metabolic panel     Status: Abnormal   Collection Time: 01/11/18 11:50  AM  Result Value Ref Range   Sodium 134 (L) 135 - 145 mmol/L   Potassium 5.5 (H) 3.5 - 5.1 mmol/L   Chloride 96 (L) 101 - 111 mmol/L   CO2 21 (L) 22 - 32 mmol/L   Glucose, Bld 84 65 - 99 mg/dL   BUN 56 (H) 6 - 20 mg/dL   Creatinine, Ser 9.46 (H) 0.61 - 1.24 mg/dL   Calcium 9.0 8.9 - 10.3 mg/dL   Total Protein 6.4 (L) 6.5 - 8.1 g/dL   Albumin 3.1 (L) 3.5 - 5.0 g/dL   AST 17 15 - 41 U/L   ALT 12 (L) 17 - 63 U/L   Alkaline Phosphatase 81 38 - 126 U/L   Total Bilirubin 0.9 0.3 - 1.2 mg/dL   GFR calc non Af Amer 5 (L) >60 mL/min   GFR calc Af Amer 6 (L) >60 mL/min    Comment: (NOTE) The eGFR has been calculated using the CKD EPI equation. This calculation has not been validated in all clinical situations. eGFR's persistently <60 mL/min signify possible Chronic Kidney Disease.    Anion gap 17 (H) 5 - 15    Comment: Performed at [facility], [place] [address], [place], [place] [address]  Troponin I     Status: Abnormal   Collection Time: 01/11/18 11:50 AM  Result Value Ref Range   Troponin I 0.03 (HH) <0.03 ng/mL    Comment: CRITICAL RESULT CALLED TO, READ BACK BY AND VERIFIED WITH: [facility] 1358 01/11/18 CLARK,S Performed at [facility], [place] [address], [place], [place] [address]   I-Stat CG4 Lactic Acid, ED     Status: None   Collection Time: 01/11/18 12:06 PM  Result Value Ref Range   Lactic Acid,  Venous 0.57 0.5 - 1.9 mmol/L     ROS: see hpi   Physical Exam: Vitals:   01/11/18 1300 01/11/18 1330  BP: (!) 182/80 (!) 187/84  Pulse: 93 97  Resp: (!) 21 (!) 23  Temp:    SpO2: 93% 92%     General: alert elderly thin AAM chronically ill , NAD, ON HD currently  HEENT:  , MM dry , EOMI , Non icteric  Neck: Supple , Pos jvd Heart: RRR, 1/6 soft sem  No rub or gallop  Lungs: bilat rales / faint wheezes  Abdomen: bs pos , soft , Nt, ND Extremities: trace pedal edema Skin: Bilat .  Neuro: alert OX3 , moves all extrem , no acute focal deficits  Dialysis Access: LUA AVF patent on HD now  Dialysis Orders: Center: [facility]  on mwf . EDW 58.5  HD Bath 2k, 2,0 ca   Time 4hr Heparin none. Access LUA AVF      Hec 2 mcg IV/HD  Mircera 14mg q2weeks  (last given 12/30/17)      Assessment/Plan 1. Hypoxic Respiratory failure sec volime overload = Hd witrh attempt 4 l uf / Possible wt loss and needs  2. ESRD -  HD MWF ([facility]) on schedule this afternoon  3. Hypertension/volume  - Vol. Incr lower  edw 4. Anemia  hgb 10.2 aranesp  Low dose 50 (last given 12/30/17) give next hd  5. Metabolic bone disease -  Hec 2  On Hd , on sensipar and auryxia  Binder  6. Ascd/ CAD-  Sp   7/29- 07/02/17 Right leg Aterial ischemia with Right axillofemoral Bypass grafting Dr. EDonnetta Hutching( dc to [facility] )  DErnest Haber PA-C [facility] Beeper  [phone] 01/11/2018, 4:52 PM

## 2018-01-11 NOTE — ED Triage Notes (Signed)
Pt arrives to ED from [facility] with complaints of congestion since 2 weeks ago. EMS reports family requested pt come to the ED today for congestion (diagnosed with bronchitis 2 weeks ago) instead of going to dialysis pt afebrile, 98% on 2L [place]. Pt placed in position of comfort with bed locked and lowered, call bell in reach.

## 2018-01-11 NOTE — ED Provider Notes (Signed)
[facility] EMERGENCY DEPARTMENT Provider Note   CSN: [id number] Arrival date & time: 01/11/18  1141     History   Chief Complaint Chief Complaint  Patient presents with  . Nasal Congestion    HPI Trevor [name] is a 72 y.o. male.  Patient is a 72 year old male with a history of end-stage renal disease on dialysis, hypertension, AAA, CHF, current smoker, who lives at a facility and being brought in today for worsening congestion over the last 2 weeks, shortness of breath and hypoxia.  Patient states that 2 weeks ago he developed a cough, congestion and sputum which has been white.  It has continued without improvement for the last 2 weeks.  He states since Friday which is 3 days prior to arrival he started feeling short of breath.  He has not been doing anything except laying in bed and sleeping.  He has had occasional vomiting and does not feel like doing anything.  He denies any diarrhea or localized abdominal pain.  He has not had any chest pain.  He thinks he was taking some antibiotics but they have not been helping.  Last dialysis was on Friday where he received a full session.  Patient does not use inhalers at home but states that the oxygen does improve his shortness of breath.  At the facility patient's O2 was 79% and he was placed on 4 L with improvement of his oxygen saturation.   The history is provided by the patient and the EMS personnel.    Past Medical History:  Diagnosis Date  . AAA (abdominal aortic aneurysm) ([place])   . Anxiety   . Arthritis   . Blindness   . CHF (congestive heart failure) ([place])   . 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 ESRD on hemodialysis ([place])    started HD 2014-15  . Headache(784.0)   . HTN (hypertension)   . Protein-calorie malnutrition [facility])     Patient Active Problem List   Diagnosis Date Noted  . Sleep stage dysfunction 11/10/2017  . 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  Vascular device, implant, or graft complication 16/94/5038  . Peripheral arterial disease ([place]) 10/20/2017  . Non-allergic rhinitis 10/20/2017  . Pain due to onychomycosis of toenails of both feet 07/30/2017  . 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 BPH (benign prostatic hyperplasia) 07/07/2017  . At risk for adverse drug reaction 07/02/2017  . Ischemia of extremity 06/28/2017  . Pulmonary edema 06/27/2017  . Weakness of both legs 01/25/2017  . Hypertensive urgency 01/24/2017  . 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 Ischemic chest pain 11/01/2016  . Hypoxemia 09/07/2016  . Essential hypertension 03/08/2016  . Orthostatic hypotension 01/12/2016  . Dyslipidemia 01/12/2016  . Cough   . Weakness generalized 12/26/2015  . Chest pain 08/12/2015  . Accelerated hypertension 08/12/2015  . Carotid stenosis   . Stroke ([place]) 04/03/2015  . Abdominal aortic aneurysm ([place]) 03/06/2015  . Generalized weakness 09/08/2014  . Acute encephalopathy 09/07/2014  . Protein-calorie malnutrition, severe ([place]) 05/20/2014  . Hypokalemia 05/19/2014  . Syncope and collapse 05/19/2014  . End-stage renal disease on hemodialysis ([place]) 05/19/2014  . Anemia of renal disease 05/19/2014  . Coronary artery disease, non-occlusive:  09/28/2012  . Anemia 09/27/2012  . Secondary hyperparathyroidism ([place]) 09/27/2012  . Non compliance with medical treatment 09/27/2012  . Chronic combined systolic and diastolic CHF (congestive heart failure) ([place]) 10/10/2011  . A-fib ([place]) 10/08/2011  .  History of stroke 10/07/2011  . Cocaine abuse ([place]) 10/07/2011  . Severe sinus bradycardia 10/07/2011  . Nonischemic cardiomyopathy ([place]) 09/18/2011  . Tobacco use disorder 08/07/2011  . Bruit 08/07/2011    Past Surgical History:  Procedure Laterality Date  . AXILLARY-FEMORAL BYPASS GRAFT  06/28/2017   Procedure: BYPASS GRAFT RIGHT AXILLA-BIFEMORAL USING 8MM X 30CM AND 8MM X 60CM HEMASHIELD GOLD GRAFTS;  Surgeon: Rosetta Posner, MD;  Location: [facility];  Service: Vascular;;  . CARDIAC CATHETERIZATION N/A 11/01/2016   Procedure: Left Heart Cath and Coronary Angiography;  Surgeon: Lorretta Harp, MD;  Location: [facility] CV LAB;  Service: Cardiovascular;  Laterality: N/A;  . ENDARTERECTOMY FEMORAL Bilateral 06/28/2017   Procedure: ENDARTERECTOMY BILATERAL FEMORAL ARTERIES;  Surgeon: Rosetta Posner, MD;  Location: [facility];  Service: Vascular;  Laterality: Bilateral;  . INSERTION OF DIALYSIS CATHETER  09/21/2012   Procedure: INSERTION OF DIALYSIS CATHETER;  Surgeon: Angelia Mould, MD;  Location: [facility];  Service: Vascular;  Laterality: N/A;  Right Internal Jugular Placement  . LEFT HEART CATHETERIZATION WITH CORONARY ANGIOGRAM N/A 09/28/2012   Procedure: LEFT HEART CATHETERIZATION WITH CORONARY ANGIOGRAM;  Surgeon: Sherren Mocha, MD;  Location: [facility] CATH LAB;  Service: Cardiovascular;  Laterality: N/A;  . LEFT HEART CATHETERIZATION WITH CORONARY ANGIOGRAM N/A 07/07/2014   Procedure: LEFT HEART CATHETERIZATION WITH CORONARY ANGIOGRAM;  Surgeon: Leonie Man, MD;  Location: [facility] CATH LAB;  Service: Cardiovascular;  Laterality: N/A;  . right hand    . TEE WITHOUT CARDIOVERSION  10/09/2011   Procedure: TRANSESOPHAGEAL ECHOCARDIOGRAM (TEE);  Surgeon: Lelon Perla, MD;  Location: [facility] ENDOSCOPY;  Service: Cardiovascular;  Laterality: N/A;       Home Medications    Prior to Admission medications   Medication Sig Start Date End Date Taking? Authorizing Provider  Amino Acids-Protein Hydrolys (FEEDING SUPPLEMENT, PRO-STAT SUGAR FREE 64,) LIQD Take 30 mLs by mouth 2 (two) times daily.    [provider]  amLODipine (NORVASC) 10 MG tablet Take 10 mg by mouth at bedtime.  04/13/17   [provider]  aspirin 81 MG chewable tablet Chew 1 tablet (81 mg total) by mouth daily. 11/04/16   Lyda Jester M, PA-C  atorvastatin (LIPITOR) 40 MG tablet Take 40 mg by mouth at bedtime.    [provider]    bisacodyl (DULCOLAX) 10 MG suppository Place 10 mg rectally daily as needed (constipation not relieved by MOM).    [provider]  clopidogrel (PLAVIX) 75 MG tablet Take 1 tablet (75 mg total) by mouth daily. 04/05/15   Barton Dubois, MD  docusate sodium (COLACE) 100 MG capsule Take 200 mg by mouth at bedtime.     [provider]  ferric citrate (AURYXIA) 1 GM 210 MG(Fe) tablet Take 630 mg by mouth 3 (three) times daily with meals.     [provider]  fluticasone (FLONASE) 50 MCG/ACT nasal spray Place 1 spray into both nostrils 2 (two) times daily.     [provider]  gabapentin (NEURONTIN) 100 MG capsule Take 1 capsule (100 mg total) by mouth 3 (three) times daily. 04/05/15   Barton Dubois, MD  hydrALAZINE (APRESOLINE) 25 MG tablet Take 25 mg by mouth 3 (three) times daily.     [provider]  isosorbide mononitrate (IMDUR) 30 MG 24 hr tablet Take 30 mg by mouth daily.    [provider]  metoprolol tartrate (LOPRESSOR) 25 MG tablet Take 1 tablet (25 mg total) by mouth 2 (  two) times daily. 11/03/16   Lyda Jester M, PA-C  multivitamin (RENA-VIT) TABS tablet Take 1 tablet by mouth at bedtime. 07/02/17   Velvet Bathe, MD  Nutritional Supplements (FEEDING SUPPLEMENT, NEPRO CARB STEADY,) LIQD Take 237 mLs by mouth 2 (two) times daily between meals. 12/11/16   Geradine Girt, DO  Omega-3 Fatty Acids (FISH OIL) 1000 MG CAPS Take 1,000 mg by mouth 2 (two) times daily.    [provider]    Family History Family History  Problem Relation Age of Onset  . Heart attack Mother        MI in her 33s  . Diabetes Mother   . Alcohol abuse Father   . Anesthesia problems Neg Hx   . Hypotension Neg Hx   . Malignant hyperthermia Neg Hx   . Pseudochol deficiency Neg Hx     Social History Social History   Tobacco Use  . Smoking status: Former Smoker    Packs/day: 0.50    Years: 50.00    Pack years: 25.00    Types: Cigarettes  .  Smokeless tobacco: Never Used  . Tobacco comment: quit 05/2017  Substance Use Topics  . Alcohol use: No    Alcohol/week: 0.0 oz    Comment: Occasional  . Drug use: No     Allergies   Patient has no known allergies.   Review of Systems Review of Systems  All other systems reviewed and are negative.    Physical Exam Updated Vital Signs BP (!) 182/80   Pulse 93   Temp 98.2 F (36.8 C) (Oral)   Resp (!) 21   Ht 5\' 10"  (1.778 m)   Wt 59 kg (130 lb)   SpO2 93%   BMI 18.65 kg/m   Physical Exam  Constitutional: He is oriented to person, place, and time. He appears well-developed and well-nourished. No distress.  HENT:  Head: Normocephalic and atraumatic.  Mouth/Throat: Oropharynx is clear and moist. Mucous membranes are dry.  Eyes: Conjunctivae and EOM are normal. Pupils are equal, round, and reactive to light.  Neck: Normal range of motion. Neck supple.  Cardiovascular: Normal rate, regular rhythm and intact distal pulses.  No murmur heard. Pulmonary/Chest: Effort normal. Tachypnea noted. No respiratory distress. He has decreased breath sounds in the right lower field and the left lower field. He has no wheezes. He has rales in the right middle field, the right lower field, the left middle field and the left lower field.  Abdominal: Soft. He exhibits no distension. There is no tenderness. There is no rebound and no guarding.  Musculoskeletal: Normal range of motion. He exhibits no edema or tenderness.  Neurological: He is alert and oriented to person, place, and time.  Skin: Skin is warm and dry. No rash noted. No erythema.  Psychiatric: He has a normal mood and affect. His behavior is normal.  Nursing note and vitals reviewed.    ED Treatments / Results  Labs (all labs ordered are listed, but only abnormal results are displayed) Labs Reviewed  CBC WITH DIFFERENTIAL/PLATELET - Abnormal; Notable for the following components:      Result Value   RBC 3.48 (*)     Hemoglobin 10.2 (*)    HCT 29.9 (*)    All other components within normal limits  COMPREHENSIVE METABOLIC PANEL - Abnormal; Notable for the following components:   Sodium 134 (*)    Potassium 5.5 (*)    Chloride 96 (*)    CO2 21 (*)  BUN 56 (*)    Creatinine, Ser 9.46 (*)    Total Protein 6.4 (*)    Albumin 3.1 (*)    ALT 12 (*)    GFR calc non Af Amer 5 (*)    GFR calc Af Amer 6 (*)    Anion gap 17 (*)    All other components within normal limits  TROPONIN I - Abnormal; Notable for the following components:   Troponin I 0.03 (*)    All other components within normal limits  I-STAT CG4 LACTIC ACID, ED    EKG  EKG Interpretation  Date/Time:  Monday January 11 2018 11:48:13 EST Ventricular Rate:  85 PR Interval:    QRS Duration: 95 QT Interval:  387 QTC Calculation: 461 R Axis:   41 Text Interpretation:  Sinus rhythm Probable left atrial enlargement Left ventricular hypertrophy Anterior Q waves, possibly due to LVH Nonspecific T abnormalities, lateral leads Baseline wander in lead(s) V2 No significant change since last tracing Confirmed by Blanchie Dessert [phone]) on 01/11/2018 1:20:37 PM       Radiology Dg Chest 2 View  Result Date: 01/11/2018 CLINICAL DATA:  Chest pain and productive cough for 2 weeks. Dyspnea. EXAM: CHEST  2 VIEW COMPARISON:  12/23/2017 FINDINGS: The heart size and mediastinal contours are within normal limits. Aortic atherosclerosis. New diffuse interstitial infiltrates and Kerley B-lines are consistent with diffuse interstitial edema. No evidence of pulmonary consolidation or pleural effusion. IMPRESSION: New diffuse pulmonary interstitial edema. No focal consolidation or pleural effusion. Electronically Signed   By: Earle Gell M.D.   On: 01/11/2018 12:55    Procedures Procedures (including critical care time)  Medications Ordered in ED Medications - No data to display   Initial Impression / Assessment and Plan / ED Course  I have reviewed  the triage vital signs and the nursing notes.  Pertinent labs & imaging results that were available during my care of the patient were reviewed by me and considered in my medical decision making (see chart for details).     Elderly gentleman with multiple medical problems presenting today with cough, congestion and shortness of breath.  Patient is having rales bilaterally up to the mid portion of each lung.  Initially hypoxic but improved with oxygen.  Patient is not wheezing or having symptoms suggestive of a COPD exacerbation.  Possible pneumonia versus fluid overload.  Patient has not dialyzed today.  EKG without acute findings.  Chest x-ray today is showing new diffuse pulmonary interstitial edema without focal consolidation or pleural effusion.  CBC with normal white count and stable hemoglobin, lactic acid within normal limits.  CMP and troponin are pending.  Patient will most likely require emergent dialysis.  Trop at baseline and CMP with mild hyperkalemia of 5.5 and uremia.  On reexam patient is still tachypneic and short of breath with rales.  Feel that he would benefit from dialysis.  At this time with normal white count, lactic acid and no focal findings on x-ray will hold further antibiotics.  Spoke with Dr. Justin Mend who will dialyze the pt and will admit.  Final Clinical Impressions(s) / ED Diagnoses   Final diagnoses:  Acute pulmonary edema ([place])  ESRD (end stage renal disease) [facility])  Hypoxia    ED Discharge Orders    None       Blanchie Dessert, MD 01/11/18 1433

## 2018-01-11 NOTE — Progress Notes (Signed)
Pt arrived to [facility] from HD department. Pt identified appropriately, cardiac monitor placed and CCMD notified, alert and oriented x 4, VS stable, no signs of acute distress, denied chest pain and SOB at this time.  Pt oriented to room and equipment, instructed to call for assistance, bed alarm in place and call bell within reach.  Will continue to monitor and treat pt per MD orders.

## 2018-01-12 ENCOUNTER — Inpatient Hospital Stay (HOSPITAL_COMMUNITY): Payer: Medicare Other

## 2018-01-12 DIAGNOSIS — Z993 Dependence on wheelchair: Secondary | ICD-10-CM

## 2018-01-12 DIAGNOSIS — I679 Cerebrovascular disease, unspecified: Secondary | ICD-10-CM

## 2018-01-12 DIAGNOSIS — I132 Hypertensive heart and chronic kidney disease with heart failure and with stage 5 chronic kidney disease, or end stage renal disease: Principal | ICD-10-CM

## 2018-01-12 DIAGNOSIS — I69398 Other sequelae of cerebral infarction: Secondary | ICD-10-CM

## 2018-01-12 DIAGNOSIS — I4891 Unspecified atrial fibrillation: Secondary | ICD-10-CM

## 2018-01-12 DIAGNOSIS — R200 Anesthesia of skin: Secondary | ICD-10-CM

## 2018-01-12 DIAGNOSIS — I509 Heart failure, unspecified: Secondary | ICD-10-CM

## 2018-01-12 DIAGNOSIS — E1122 Type 2 diabetes mellitus with diabetic chronic kidney disease: Secondary | ICD-10-CM

## 2018-01-12 DIAGNOSIS — Z720 Tobacco use: Secondary | ICD-10-CM

## 2018-01-12 DIAGNOSIS — Z7982 Long term (current) use of aspirin: Secondary | ICD-10-CM

## 2018-01-12 DIAGNOSIS — Z7902 Long term (current) use of antithrombotics/antiplatelets: Secondary | ICD-10-CM

## 2018-01-12 DIAGNOSIS — J9601 Acute respiratory failure with hypoxia: Secondary | ICD-10-CM

## 2018-01-12 DIAGNOSIS — N186 End stage renal disease: Secondary | ICD-10-CM

## 2018-01-12 DIAGNOSIS — I2510 Atherosclerotic heart disease of native coronary artery without angina pectoris: Secondary | ICD-10-CM

## 2018-01-12 DIAGNOSIS — I714 Abdominal aortic aneurysm, without rupture: Secondary | ICD-10-CM

## 2018-01-12 DIAGNOSIS — Z79899 Other long term (current) drug therapy: Secondary | ICD-10-CM

## 2018-01-12 DIAGNOSIS — Z992 Dependence on renal dialysis: Secondary | ICD-10-CM

## 2018-01-12 DIAGNOSIS — R011 Cardiac murmur, unspecified: Secondary | ICD-10-CM

## 2018-01-12 DIAGNOSIS — H543 Unqualified visual loss, both eyes: Secondary | ICD-10-CM

## 2018-01-12 DIAGNOSIS — I5043 Acute on chronic combined systolic (congestive) and diastolic (congestive) heart failure: Secondary | ICD-10-CM

## 2018-01-12 DIAGNOSIS — M21371 Foot drop, right foot: Secondary | ICD-10-CM

## 2018-01-12 DIAGNOSIS — I43 Cardiomyopathy in diseases classified elsewhere: Secondary | ICD-10-CM

## 2018-01-12 DIAGNOSIS — J988 Other specified respiratory disorders: Secondary | ICD-10-CM

## 2018-01-12 LAB — BASIC METABOLIC PANEL
Anion gap: 17 — ABNORMAL HIGH (ref 5–15)
BUN: 26 mg/dL — AB (ref 6–20)
CHLORIDE: 94 mmol/L — AB (ref 101–111)
CO2: 26 mmol/L (ref 22–32)
CREATININE: 5.47 mg/dL — AB (ref 0.61–1.24)
Calcium: 9.4 mg/dL (ref 8.9–10.3)
GFR calc Af Amer: 11 mL/min — ABNORMAL LOW (ref >60)
GFR calc non Af Amer: 9 mL/min — ABNORMAL LOW (ref >60)
GLUCOSE: 89 mg/dL (ref 65–99)
POTASSIUM: 4.3 mmol/L (ref 3.5–5.1)
SODIUM: 137 mmol/L (ref 135–145)

## 2018-01-12 LAB — ECHOCARDIOGRAM COMPLETE
Height: 70 in
WEIGHTICAEL: 2179.91 [oz_av]

## 2018-01-12 LAB — BRAIN NATRIURETIC PEPTIDE: B Natriuretic Peptide: 4136 pg/mL — ABNORMAL HIGH (ref 0.0–100.0)

## 2018-01-12 LAB — MRSA PCR SCREENING: MRSA by PCR: NEGATIVE

## 2018-01-12 MED ORDER — ORAL CARE MOUTH RINSE
15.0000 mL | Freq: Two times a day (BID) | OROMUCOSAL | Status: DC
  Administered 2018-01-12 – 2018-01-14 (×5): 15 mL via OROMUCOSAL

## 2018-01-12 MED ORDER — IPRATROPIUM-ALBUTEROL 0.5-2.5 (3) MG/3ML IN SOLN
3.0000 mL | Freq: Three times a day (TID) | RESPIRATORY_TRACT | Status: DC
  Administered 2018-01-12 – 2018-01-13 (×3): 3 mL via RESPIRATORY_TRACT
  Filled 2018-01-12 (×4): qty 3

## 2018-01-12 NOTE — Progress Notes (Addendum)
Subjective:  Trevor [name] was seen lying in bed this morning. He reports improvement in his shortness of breath after dialysis yesterday. Denies chest pain or fevers/chills. Patient stated that he had an appointment with his PCP at the [facility] in [place] for 1pm today. He asks whether he will be able to make it or whether he can get assistance with rescheduling it.  Objective:  Vital signs in last 24 hours: Vitals:   01/11/18 2208 01/11/18 2340 01/12/18 0000 01/12/18 0516  BP: 128/68 130/68  (!) 148/73  Pulse: (!) 101   94  Resp: 18   16  Temp: 98.1 F (36.7 C)   99.1 F (37.3 C)  TempSrc: Oral   Oral  SpO2: (!) 87%  98% 94%  Weight: 136 lb 3.9 oz (61.8 kg)     Height: 5\' 10"  (1.778 m)      GEN: Well-nourished male lying in bed. Appears uncomfortable. Alert and oriented. No acute distress. HENT: [place]/AT. Moist mucous membranes. No visible lesions. No erythema in posterior pharynx. 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 Sclera non-icteric. Conjunctiva clear. RESP: Diminished breath sounds in RLL. No increased work of breathing, on RA CV: Normal rate and regular rhythm. No m/r/g. No JVD. No LE edema. ABD: Soft. Non-tender. Non-distended. Normoactive bowel sounds. EXT: No edema. Warm and well perfused. Capillary refill <2 sec. LUE AVF with +thrill. NEURO: Cranial nerves II-XII grossly intact. Able to lift all four extremities against gravity. Chronic right foot drop and numbness. No apparent audiovisual hallucinations. Speech fluent and appropriate.  PSYCH: Patient is calm and pleasant. Appropriate affect. Well-groomed; speech is appropriate and on-subject.  Assessment/Plan:  Principal Problem:   Respiratory failure with hypoxia (HCC) Active Problems:   Tobacco use disorder   Nonischemic cardiomyopathy (HCC)   Chronic combined systolic and diastolic CHF (congestive heart failure) (HCC)   End-stage renal disease on hemodialysis (HCC)   Anemia of renal  disease   Essential hypertension   Pulmonary edema   Peripheral arterial disease (HCC)   Partial blindness  Trevor [name] is a 72yo male with PMH of ESRD on HD MWF, HTN, diabetes, AAA, HFrEF (TTE 2017 with LVEF 45-50%, grade 1 DD), CAD, CVA c/b partial blindness, ?atrial fibrillation, and tobacco use disorder who presents from [facility] with several weeks of worsening respiratory infectious symptoms, with new oxygen requirement and volume overload. Improved after HD 2/11.  Acute hypoxic respiratory failure 2/2 acute on chronic heart failure exacerbation, likely precipitated by recent respiratory infection, improved In setting of ESRD on dialysis. BNP 4136. Received HD 2/11 with improvement in symptoms and oxygen requirement. Now on RA with sats >95%. Flu negative. Echo pending. Less likely COPD exacerbation - no evidence of wheezing or coughing on pulmonary exam. No infectious s/s. Continues to be afebrile. Patient may need adjustment of dry weight. Weight 133 -> 136lb today. - Telemetry - D/c droplet precautions - HD, per Nephrology - Duonebs 8mL TID - Daily weights - Fluticasone 1 spray BID - F/u TTE - Discontinue supplemental oxygen - Case management consulted for assistance with getting PCP appointment rescheduled; appreciate their help  ESRD with HD MWF Received dialysis 2/11. Nephrology challenging dry weight. - Nephrology consulted; appreciate their assistance - HD, per Nephrology  HTN ?Hx of atrial fibrillation Home regimen includes amlodipine 10mg  daily, imdur 30mg  daily, hydralazine 25mg  TID, and metoprolol 25mg  BID. Currently in NSR. Chart notes ?afib, completed outpatient work-up. Not currently on chronic anticoagulation. BP improved to  148/73, HR 94 s/p HD. - Telemetry - Continue home amlodipine, imdur, hydralazine, and metoprolol - Dialysis as above  CAD Troponin mildly elevated 0.03, but hx of ESRD. Home regimen includes aspirin 81mg  and plavix 75mg   daily, and atorvastatin 40mg  QHS. Most recent cath  - Continue home aspirin and plavix - Continue home atorvastatin  Dispo: Anticipated discharge in approximately 1-2 day(s).   Trevor Ewing, MD 01/12/2018, 8:33 AM Pager: Mamie Nick [phone]

## 2018-01-12 NOTE — Progress Notes (Signed)
Trevor [name] Progress Note   Subjective: Awake, alert and oriented. Denies SOB. Resting comfortably in low fowlers position on RA. No new C/Os.   Objective Vitals:   01/12/18 0905 01/12/18 1459 01/12/18 1500 01/12/18 1720  BP:   (!) 124/56 (!) 133/56  Pulse:  86 82   Resp:  18 18   Temp:   98.6 F (37 C)   TempSrc:   Oral   SpO2: 91% 92% 90%   Weight:      Height:       Physical Exam General: Pleasant elderly male in NAD Heart: Y1,E5 2/6 systolic M. No JVD at 30 degrees. Prominent neck veins.  Lungs: CTAB A/P slightly decreased breath sounds R>L Abdomen: active BS Extremities: No LE edema.  Dialysis Access: LUA AVF + bruit.    Additional Objective Labs: Basic Metabolic Panel: Recent Labs  Lab 01/11/18 1150 01/12/18 0515  NA 134* 137  K 5.5* 4.3  CL 96* 94*  CO2 21* 26  GLUCOSE 84 89  BUN 56* 26*  CREATININE 9.46* 5.47*  CALCIUM 9.0 9.4   Liver Function Tests: Recent Labs  Lab 01/11/18 1150  AST 17  ALT 12*  ALKPHOS 81  BILITOT 0.9  PROT 6.4*  ALBUMIN 3.1*   No results for input(s): LIPASE, AMYLASE in the last 168 hours. CBC: Recent Labs  Lab 01/11/18 1150  WBC 8.2  NEUTROABS 5.7  HGB 10.2*  HCT 29.9*  MCV 85.9  PLT 202   Blood Culture    Component Value Date/Time   SDES HEMODIALYSIS LINE 12/27/2015 1340   SPECREQUEST BOTTLES DRAWN AEROBIC AND ANAEROBIC 10MLS 12/27/2015 1340   CULT NO GROWTH 5 DAYS 12/27/2015 1340   REPTSTATUS 01/01/2016 FINAL 12/27/2015 1340    Cardiac Enzymes: Recent Labs  Lab 01/11/18 1150  TROPONINI 0.03*   CBG: No results for input(s): GLUCAP in the last 168 hours. Iron Studies: No results for input(s): IRON, TIBC, TRANSFERRIN, FERRITIN in the last 72 hours. @lablastinr3 @ Studies/Results: Dg Chest 2 View  Result Date: 01/11/2018 CLINICAL DATA:  Chest pain and productive cough for 2 weeks. Dyspnea. EXAM: CHEST  2 VIEW COMPARISON:  12/23/2017 FINDINGS: The heart size and mediastinal contours are  within normal limits. Aortic atherosclerosis. New diffuse interstitial infiltrates and Kerley B-lines are consistent with diffuse interstitial edema. No evidence of pulmonary consolidation or pleural effusion. IMPRESSION: New diffuse pulmonary interstitial edema. No focal consolidation or pleural effusion. Electronically Signed   By: Earle Gell M.D.   On: 01/11/2018 12:55   Medications:  . amLODipine  10 mg Oral QHS  . aspirin  81 mg Oral Daily  . atorvastatin  40 mg Oral QHS  . clopidogrel  75 mg Oral Daily  . docusate sodium  200 mg Oral QHS  . [START ON 01/13/2018] doxercalciferol  2 mcg Intravenous Q M,W,F-HD  . feeding supplement (PRO-STAT SUGAR FREE 64)  30 mL Oral BID  . fluticasone  1 spray Each Nare BID  . gabapentin  100 mg Oral TID  . heparin  5,000 Units Subcutaneous Q8H  . hydrALAZINE  25 mg Oral TID  . ipratropium-albuterol  3 mL Nebulization TID  . isosorbide mononitrate  30 mg Oral Daily  . mouth rinse  15 mL Mouth Rinse BID  . metoprolol tartrate  25 mg Oral BID  . multivitamin  1 tablet Oral QHS     Dialysis Orders: Center: [facility]  on mwf . EDW 58.5  HD Bath 2k, 2,0 ca   Time  4hr Heparin none. Access LUA AVF      Hec 2 mcg IV/HD  Mircera 103mcg q2weeks  (last given 12/30/17)      Assessment/Plan 1. Hypoxic Respiratory failure sec volime overload. HD 01/11/2018 Pre wt 60.5 Net UF 2097 Post wt 59.8. Sx improved with HD. Appears comfortable on RA. Continue to lower volume as tolerated.  2. ESRD -  HD MWF. HD tomorrow on schedule. K+ 4.3 use 2.0 K bath. No heparin.  3. Hypertension/volume  - As noted above. BP well controlled.  4. Anemia  hgb 10.2 ESA due tomorrow. Will give Aranesp 40 mcg IV with HD.  5. Metabolic bone disease -  Hec 2  On Hd , on sensipar and auryxia  B[place]  6. H/O PAD-  Sp   7/29- 07/02/17 Right leg Aterial ischemia with Right axillofemoral Bypass grafting Dr. Donnetta Hutching ( dc to [facility] ). On ASA and plavix.  7. Nutrition: Albumin 3.1 renal diet, nepro, renal  vits.  8.   H/O CAD 9. H/O AAA   Trevor Lauman H. Cullan Launer NP-C 01/12/2018, 7:00 PM  [employer] [phone]

## 2018-01-12 NOTE — Discharge Summary (Signed)
Name: Trevor [name] MRN: [id number] DOB: 12-08-1945 72 y.o. PCP: [facility], [name]  Date of Admission: 01/11/2018 11:41 AM Date of Discharge: 01/14/2018 Attending Physician: Trevor Belt, MD  Discharge Diagnosis: 1. Pulmonary edema 2/2 acute on chronic HF exacerbation 2. ESRD on HD MWF  Principal Problem:   Respiratory failure with hypoxia ([place]) Active Problems:   Tobacco use disorder   Nonischemic cardiomyopathy (HCC)   Chronic combined systolic and diastolic CHF (congestive heart failure) (HCC)   End-stage renal disease on hemodialysis (HCC)   Anemia of renal disease   Essential hypertension   Pulmonary edema   Peripheral arterial disease (HCC)   Partial blindness   Discharge Medications: Allergies as of 01/14/2018   No Known Allergies     Medication List    TAKE these medications   amLODipine 10 MG tablet Commonly known as:  NORVASC Take 10 mg by mouth at bedtime.   aspirin 81 MG chewable tablet Chew 1 tablet (81 mg total) by mouth daily.   atorvastatin 40 MG tablet Commonly known as:  LIPITOR Take 40 mg by mouth at bedtime.   bisacodyl 10 MG suppository Commonly known as:  DULCOLAX Place 10 mg rectally daily as needed (constipation not relieved by MOM).   clopidogrel 75 MG tablet Commonly known as:  PLAVIX Take 1 tablet (75 mg total) by mouth daily.   docusate sodium 100 MG capsule Commonly known as:  COLACE Take 200 mg by mouth at bedtime.   doxercalciferol 4 MCG/2ML injection Commonly known as:  HECTOROL Inject 1 mL (2 mcg total) into the vein every Monday, Wednesday, and Friday with hemodialysis. Start taking on:  01/15/2018   feeding supplement (NEPRO CARB STEADY) Liqd Take 237 mLs by mouth 2 (two) times daily between meals.   feeding supplement (PRO-STAT SUGAR FREE 64) Liqd Take 30 mLs by mouth 2 (two) times daily.   ferric citrate 1 GM 210 MG(Fe) tablet Commonly known as:  AURYXIA Take 630 mg by mouth 3 (three) times daily  with meals.   Fish Oil 1000 MG Caps Take 1,000 mg by mouth 2 (two) times daily.   fluticasone 50 MCG/ACT nasal spray Commonly known as:  FLONASE Place 1 spray into both nostrils 2 (two) times daily.   gabapentin 300 MG capsule Commonly known as:  NEURONTIN Take 1 capsule (300 mg total) by mouth at bedtime. What changed:    medication strength  how much to take  when to take this   hydrALAZINE 25 MG tablet Commonly known as:  APRESOLINE Take 25 mg by mouth 3 (three) times daily.   isosorbide mononitrate 30 MG 24 hr tablet Commonly known as:  IMDUR Take 30 mg by mouth daily.   metoprolol tartrate 25 MG tablet Commonly known as:  LOPRESSOR Take 1 tablet (25 mg total) by mouth 2 (two) times daily.   multivitamin Tabs tablet Take 1 tablet by mouth at bedtime.       Disposition and follow-up:   Mr.Trevor [name] was discharged from [facility] in Good condition.  At the hospital follow up visit please address:  1.  Patient admitted for heart failure exacerbation, possibly exacerbated by recent upper respiratory viral illness, and managed with hemodialysis with significant and quick improvement in his respiratory symptoms. Please assess patient's respiratory symptoms. Nephrology also adjusting his dry weight.  2.  Labs / imaging needed at time of follow-up: None  3.  Pending labs/ test needing follow-up: None  Follow-up Appointments: Follow-up  Information    Clinic, [facility] Schedule an appointment as soon as possible for a visit in 1 week(s).   Contact information: [place] [address] [phone]           Hospital Course by problem list: Principal Problem:   Respiratory failure with hypoxia ([place]) Active Problems:   Tobacco use disorder   Nonischemic cardiomyopathy (HCC)   Chronic combined systolic and diastolic CHF (congestive heart failure) (HCC)   End-stage renal disease on hemodialysis  (HCC)   Anemia of renal disease   Essential hypertension   Pulmonary edema   Peripheral arterial disease (HCC)   Partial blindness   1. Pulmonary edema 2/2 Acute on chronic heart failure exacerbation Patient was admitted with worsened shortness of breath and increased oxygen requirement after completing recent course of antibiotics for possible HCAP. Found to have interstitial pulmonary edema on CXR and BNP >4000. He was managed with dialysis, per Nephrology (HD 2/11 and 2/13). His symptoms improved the following morning after dialysis. Flu negative. Repeat echo was similar to prior with LVEF 45-50%, diffuse hypokinesis, and grade 1 DD. His symptoms significantly improved and he was saturating well on room air at the time of discharge.  2. ESRD on HD MWF Received HD per Nephrology (2/11 and 2/13). Dry weight challenged per Nephrology. Discharged with plan to continue typical outpatient HD schedule.  3. HTN He was continued on all of his home BP medications while inpatient and received dialysis per Nephrology.  Discharge Vitals:   BP (!) 148/60 (BP Location: Right Arm)   Pulse 78   Temp 98.6 F (37 C) (Oral)   Resp 16   Ht 5\' 10"  (1.778 m)   Wt 127 lb 10.3 oz (57.9 kg)   SpO2 96%   BMI 18.32 kg/m   Pertinent Labs, Studies, and Procedures:  CBC Latest Ref Rng & Units 01/13/2018 01/11/2018 11/03/2017  WBC 4.0 - 10.5 K/uL 5.3 8.2 6.8  Hemoglobin 13.0 - 17.0 g/dL 9.6(L) 10.2(L) 11.2(L)  Hematocrit 39.0 - 52.0 % 28.2(L) 29.9(L) 33.2(L)  Platelets 150 - 400 K/uL 186 202 212   CMP Latest Ref Rng & Units 01/13/2018 01/12/2018 01/11/2018  Glucose 65 - 99 mg/dL 86 89 84  BUN 6 - 20 mg/dL 50(H) 26(H) 56(H)  Creatinine 0.61 - 1.24 mg/dL 8.32(H) 5.47(H) 9.46(H)  Sodium 135 - 145 mmol/L 138 137 134(L)  Potassium 3.5 - 5.1 mmol/L 4.4 4.3 5.5(H)  Chloride 101 - 111 mmol/L 97(L) 94(L) 96(L)  CO2 22 - 32 mmol/L 25 26 21(L)  Calcium 8.9 - 10.3 mg/dL 9.7 9.4 9.0  Total Protein 6.5 - 8.1 g/dL - -  6.4(L)  Total Bilirubin 0.3 - 1.2 mg/dL - - 0.9  Alkaline Phos 38 - 126 U/L - - 81  AST 15 - 41 U/L - - 17  ALT 17 - 63 U/L - - 12(L)   Trop 0.03 BNP 4136 Flu negative Lactic acid 0.57  CXR 01/11/2018 New diffuse pulmonary interstitial edema. No focal consolidation or pleural effusion.  TTE 01/12/2018 - Left ventricle: The cavity size was normal. Wall thickness was   increased in a pattern of moderate LVH. Systolic function was   mildly reduced. The estimated ejection fraction was in the range   of 45% to 50%. Diffuse hypokinesis. Doppler parameters are   consistent with abnormal left ventricular relaxation (grade 1   diastolic dysfunction). - Aortic valve: Trileaflet; mildly thickened, mildly calcified   leaflets. - Mitral valve: Moderately calcified annulus. -  Left atrium: The atrium was mildly dilated.  Discharge Instructions: Discharge Instructions    Call MD for:  difficulty breathing, headache or visual disturbances   Complete by:  As directed    Call MD for:  persistant dizziness or light-headedness   Complete by:  As directed    Call MD for:  severe uncontrolled pain   Complete by:  As directed    Call MD for:  temperature >100.4   Complete by:  As directed    Diet - low sodium heart healthy   Complete by:  As directed    Discharge instructions   Complete by:  As directed    Mr. Damel, Querry were treated with hemodialysis for too much fluid in your body. Please continue with your normal outpatient dialysis schedule on Monday, Wednesday, Friday.  Please also make sure to call your primary doctor at the [facility] in [place] to schedule another appointment to follow up with them.   Increase activity slowly   Complete by:  As directed       Signed: Colbert Ewing, MD 01/14/2018, 11:47 AM   Pager: Mamie Nick [phone]

## 2018-01-13 LAB — RENAL FUNCTION PANEL
Albumin: 2.9 g/dL — ABNORMAL LOW (ref 3.5–5.0)
Anion gap: 16 — ABNORMAL HIGH (ref 5–15)
BUN: 50 mg/dL — ABNORMAL HIGH (ref 6–20)
CO2: 25 mmol/L (ref 22–32)
Calcium: 9.7 mg/dL (ref 8.9–10.3)
Chloride: 97 mmol/L — ABNORMAL LOW (ref 101–111)
Creatinine, Ser: 8.32 mg/dL — ABNORMAL HIGH (ref 0.61–1.24)
GFR calc Af Amer: 7 mL/min — ABNORMAL LOW (ref >60)
GFR calc non Af Amer: 6 mL/min — ABNORMAL LOW (ref >60)
Glucose, Bld: 86 mg/dL (ref 65–99)
Phosphorus: 7 mg/dL — ABNORMAL HIGH (ref 2.5–4.6)
Potassium: 4.4 mmol/L (ref 3.5–5.1)
Sodium: 138 mmol/L (ref 135–145)

## 2018-01-13 LAB — CBC
HCT: 28.2 % — ABNORMAL LOW (ref 39.0–52.0)
Hemoglobin: 9.6 g/dL — ABNORMAL LOW (ref 13.0–17.0)
MCH: 29.4 pg (ref 26.0–34.0)
MCHC: 34 g/dL (ref 30.0–36.0)
MCV: 86.2 fL (ref 78.0–100.0)
Platelets: 186 10*3/uL (ref 150–400)
RBC: 3.27 MIL/uL — ABNORMAL LOW (ref 4.22–5.81)
RDW: 14.3 % (ref 11.5–15.5)
WBC: 5.3 K/uL (ref 4.0–10.5)

## 2018-01-13 MED ORDER — SODIUM CHLORIDE 0.9 % IV SOLN: 100.0000 mL | INTRAVENOUS | Status: DC | PRN

## 2018-01-13 MED ORDER — LIDOCAINE HCL (PF) 1 % IJ SOLN: 5.0000 mL | INTRAMUSCULAR | Status: DC | PRN

## 2018-01-13 MED ORDER — PENTAFLUOROPROP-TETRAFLUOROETH EX AERO: 1.0000 "application " | INHALATION_SPRAY | CUTANEOUS | Status: DC | PRN

## 2018-01-13 MED ORDER — IPRATROPIUM-ALBUTEROL 0.5-2.5 (3) MG/3ML IN SOLN: 3.0000 mL | RESPIRATORY_TRACT | Status: DC | PRN

## 2018-01-13 MED ORDER — LIDOCAINE-PRILOCAINE 2.5-2.5 % EX CREA: 1.0000 "application " | TOPICAL_CREAM | CUTANEOUS | Status: DC | PRN

## 2018-01-13 NOTE — Progress Notes (Signed)
[facility] ROUNDING NOTE   Subjective:    72 y.o. male with ESRD chronic hd MWF  Last tx  On schedule 01/08/18 co sob, dry cough myalgias, subjective fevers,  DOE, and PND.Lives at [facility] ,smoked 2-3 cigarettes /day ,had ho CAD and Right leg Aterial ischemia with Right axillofemoral Bypass grafting 7/29- 07/02/17   Was admitted for volume overload and pulmonary edema  Patient was seen on dialysis     Objective:  Vital signs in last 24 hours:  Temp:  [97.5 F (36.4 C)-99.8 F (37.7 C)] 97.5 F (36.4 C) (02/13 0815) Pulse Rate:  [48-93] 87 (02/13 1100) Resp:  [16-18] 18 (02/13 0815) BP: (109-164)/(50-95) 109/50 (02/13 1100) SpO2:  [90 %-93 %] 93 % (02/13 0815) Weight:  [127 lb 10.3 oz (57.9 kg)] 127 lb 10.3 oz (57.9 kg) (02/13 0815)  Weight change:  Filed Weights   01/11/18 2121 01/11/18 2208 01/13/18 0815  Weight: 131 lb 13.4 oz (59.8 kg) 136 lb 3.9 oz (61.8 kg) 127 lb 10.3 oz (57.9 kg)    Intake/Output: I/O last 3 completed shifts: In: 960 [P.O.:960] Out: 2097 [Other:2097]   Intake/Output this shift:  No intake/output data recorded.   General: Pleasant elderly male in NAD Heart: S8,N4 2/6 systolic M. No JVD at 30 degrees. Prominent neck veins.  Lungs: CTAB A/P slightly decreased breath sounds R>L Abdomen: active BS Extremities: No LE edema.  Dialysis Access: LUA AVF + bruit     Basic Metabolic Panel: Recent Labs  Lab 01/11/18 1150 01/12/18 0515 01/13/18 0836  NA 134* 137 138  K 5.5* 4.3 4.4  CL 96* 94* 97*  CO2 21* 26 25  GLUCOSE 84 89 86  BUN 56* 26* 50*  CREATININE 9.46* 5.47* 8.32*  CALCIUM 9.0 9.4 9.7  PHOS  --   --  7.0*    Liver Function Tests: Recent Labs  Lab 01/11/18 1150 01/13/18 0836  AST 17  --   ALT 12*  --   ALKPHOS 81  --   BILITOT 0.9  --   PROT 6.4*  --   ALBUMIN 3.1* 2.9*   No results for input(s): LIPASE, AMYLASE in the last 168 hours. No results for input(s): AMMONIA in the last 168  hours.  CBC: Recent Labs  Lab 01/11/18 1150 01/13/18 0837  WBC 8.2 5.3  NEUTROABS 5.7  --   HGB 10.2* 9.6*  HCT 29.9* 28.2*  MCV 85.9 86.2  PLT 202 186    Cardiac Enzymes: Recent Labs  Lab 01/11/18 1150  TROPONINI 0.03*    BNP: Invalid input(s): POCBNP  CBG: No results for input(s): GLUCAP in the last 168 hours.  Microbiology: Results for orders placed or performed during the hospital encounter of 01/11/18  MRSA PCR Screening     Status: None   Collection Time: 01/11/18 11:36 PM  Result Value Ref Range Status   MRSA by PCR NEGATIVE NEGATIVE Final    Comment:        The GeneXpert MRSA Assay (FDA approved for NASAL specimens only), is one component of a comprehensive MRSA colonization surveillance program. It is not intended to diagnose MRSA infection nor to guide or monitor treatment for MRSA infections. Performed at [facility], [place] [address], [place], [place] [address]     Coagulation Studies: No results for input(s): LABPROT, INR in the last 72 hours.  Urinalysis: No results for input(s): COLORURINE, LABSPEC, PHURINE, GLUCOSEU, HGBUR, BILIRUBINUR, KETONESUR, PROTEINUR, UROBILINOGEN, NITRITE, LEUKOCYTESUR in the last 72  hours.  Invalid input(s): APPERANCEUR    Imaging: Dg Chest 2 View  Result Date: 01/11/2018 CLINICAL DATA:  Chest pain and productive cough for 2 weeks. Dyspnea. EXAM: CHEST  2 VIEW COMPARISON:  12/23/2017 FINDINGS: The heart size and mediastinal contours are within normal limits. Aortic atherosclerosis. New diffuse interstitial infiltrates and Kerley B-lines are consistent with diffuse interstitial edema. No evidence of pulmonary consolidation or pleural effusion. IMPRESSION: New diffuse pulmonary interstitial edema. No focal consolidation or pleural effusion. Electronically Signed   By: Earle Gell M.D.   On: 01/11/2018 12:55     Medications:   . sodium chloride    . sodium chloride     . amLODipine  10 mg Oral QHS  .  aspirin  81 mg Oral Daily  . atorvastatin  40 mg Oral QHS  . clopidogrel  75 mg Oral Daily  . docusate sodium  200 mg Oral QHS  . doxercalciferol  2 mcg Intravenous Q M,[name],F-HD  . feeding supplement (PRO-STAT SUGAR FREE 64)  30 mL Oral BID  . fluticasone  1 spray Each Nare BID  . gabapentin  100 mg Oral TID  . heparin  5,000 Units Subcutaneous Q8H  . hydrALAZINE  25 mg Oral TID  . ipratropium-albuterol  3 mL Nebulization TID  . isosorbide mononitrate  30 mg Oral Daily  . mouth rinse  15 mL Mouth Rinse BID  . metoprolol tartrate  25 mg Oral BID  . multivitamin  1 tablet Oral QHS   sodium chloride, sodium chloride, bisacodyl, lidocaine (PF), lidocaine-prilocaine, pentafluoroprop-tetrafluoroeth  Assessment/ Plan:  1. Hypoxic Respiratory failure sec volime overload =  Continue to challenge dry weight  2. ESRD -  HD MWF (ASH)  Seen on dialysis 3. Hypertension/volume  - Vol. Incr lower  edw 4. Anemia  hgb 10.2 aranesp  Low dose 50 (last given 12/30/17) give next hd  5. Metabolic bone disease -  Hec 2  On Hd , on sensipar and auryxia  Binder  6. Ascd/ CAD-  Sp   7/29- 07/02/17 Right leg Aterial ischemia with Right axillofemoral Bypass grafting Dr. Donnetta Hutching ( dc to NH )        LOS: 2 Trevor [name] @TODAY @11 :21 AM

## 2018-01-13 NOTE — Progress Notes (Signed)
Medicine attending: I examined this patient today together with resident physician Dr. Colbert Ewing and I concur with her evaluation and management plan. Respiratory symptoms are improving on dialysis. Oxygen saturation currently 93% on 3 L oxygen. Diffuse decreased breath sounds over the lungs left greater than right unchanged. Weight 127 pounds down from 133 on admission. Impression: Pulmonary edema secondary to fluid overload related to end-stage renal disease improving on dialysis.  Target dry weight per nephrology.  Additional precipitating factors: Acute viral bronchitis. No evidence for acute myocardial damage.

## 2018-01-13 NOTE — Progress Notes (Signed)
   Subjective:  Trevor [name] was seen lying in HD bed this morning. He reports improvement in his shortness of breath. Denies chest pain or fevers/chills.  Objective:  Vital signs in last 24 hours: Vitals:   01/13/18 0900 01/13/18 0930 01/13/18 1000 01/13/18 1030  BP: (!) 117/55 (!) 123/58 (!) 164/80 (!) 144/95  Pulse: 61 90 72 80  Resp:      Temp:      TempSrc:      SpO2:      Weight:      Height:       GEN: Well-nourished male lying in HD bed. Alert and oriented. No acute distress. HENT: [place]/AT. Moist mucous membranes. No visible lesions. No erythema in posterior pharynx. 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 Sclera non-icteric. Conjunctiva clear. RESP: CTAB. No increased work of breathing, on RA CV: Normal rate and regular rhythm. No m/r/g. No LE edema. ABD: Soft. Non-tender. Non-distended. Normoactive bowel sounds. EXT: No edema. Warm and well perfused. Capillary refill <2 sec. LUE AVF with +thrill. NEURO: Cranial nerves II-XII grossly intact. Able to lift all four extremities against gravity. Chronic right foot drop and numbness. No apparent audiovisual hallucinations. Speech fluent and appropriate.  PSYCH: Patient is calm and pleasant. Appropriate affect. Well-groomed; speech is appropriate and on-subject.  Assessment/Plan:  Principal Problem:   Respiratory failure with hypoxia (HCC) Active Problems:   Tobacco use disorder   Nonischemic cardiomyopathy (HCC)   Chronic combined systolic and diastolic CHF (congestive heart failure) (HCC)   End-stage renal disease on hemodialysis (HCC)   Anemia of renal disease   Essential hypertension   Pulmonary edema   Peripheral arterial disease (HCC)   Partial blindness  Trevor [name] is a 72yo male with PMH of ESRD on HD MWF, HTN, diabetes, AAA, HFrEF (TTE 2017 with LVEF 45-50%, grade 1 DD), CAD, CVA c/b partial blindness, ?atrial fibrillation, and tobacco use disorder who presents from [facility] with several weeks of worsening respiratory infectious symptoms, with new oxygen requirement and volume overload. Improved after HD.  Acute hypoxic respiratory failure 2/2 acute on chronic heart failure exacerbation, likely precipitated by recent respiratory infection, improved In setting of ESRD on dialysis. BNP 4136. Receiving HD with improvement in symptoms and oxygen requirement. Now on RA with sats >95%. Flu negative. Repeat echo looks similar to previous - LVEF 45-50%, grade 1 DD. Less likely COPD exacerbation - no evidence of wheezing or coughing on pulmonary exam. No infectious s/s. Continues to be afebrile. Patient may need adjustment of dry weight. Weight 133 -> 127lb today. - Telemetry - HD, per Nephrology - Duonebs 42mL TID - Daily weights - Fluticasone 1 spray BID  ESRD with HD MWF Nephrology challenging dry weight. - Nephrology consulted; appreciate their assistance - HD, per Nephrology  HTN ?Hx of atrial fibrillation Home regimen includes amlodipine 10mg  daily, imdur 30mg  daily, hydralazine 25mg  TID, and metoprolol 25mg  BID. Currently in NSR. Chart notes ?afib, completed outpatient work-up. Not currently on chronic anticoagulation. BP stable at 144/60, HR 90 after getting hemodialysis. - Telemetry - Continue home amlodipine, imdur, hydralazine, and metoprolol - Dialysis as above  CAD Troponin mildly elevated 0.03, but hx of ESRD. Home regimen includes aspirin 81mg  and plavix 75mg  daily, and atorvastatin 40mg  QHS. - Continue home aspirin and plavix - Continue home atorvastatin  Dispo: Anticipated discharge in approximately 1-2 day(s).  Colbert Ewing, MD 01/13/2018, 10:33 AM Pager: Mamie Nick [phone]

## 2018-01-13 NOTE — Care Management Note (Signed)
Case Management Note  Patient Details  Name: Trevor [name] MRN: [id number] Date of Birth: Feb 21, 1946  Subjective/Objective:     Respiratory Failure with Hypoxia             Action/Plan: Patient is from [facility]; SW consult place for transitioning back to facility at discharge when medically stable; CM will continue to follow for progression of care.  Expected Discharge Date:    possibly 01/17/2018              Expected Discharge Plan:  Skilled Nursing Facility  In-House Referral:  Clinical Social Work  Discharge planning Services  CM Consult  Status of Service:  In process, will continue to follow  Sherrilyn Rist [phone] 01/13/2018, 4:11 PM

## 2018-01-14 DIAGNOSIS — F17210 Nicotine dependence, cigarettes, uncomplicated: Secondary | ICD-10-CM | POA: Diagnosis not present

## 2018-01-14 DIAGNOSIS — R200 Anesthesia of skin: Secondary | ICD-10-CM | POA: Diagnosis not present

## 2018-01-14 DIAGNOSIS — N186 End stage renal disease: Secondary | ICD-10-CM | POA: Diagnosis not present

## 2018-01-14 DIAGNOSIS — D638 Anemia in other chronic diseases classified elsewhere: Secondary | ICD-10-CM | POA: Diagnosis not present

## 2018-01-14 DIAGNOSIS — I509 Heart failure, unspecified: Secondary | ICD-10-CM | POA: Diagnosis not present

## 2018-01-14 DIAGNOSIS — J9600 Acute respiratory failure, unspecified whether with hypoxia or hypercapnia: Secondary | ICD-10-CM | POA: Diagnosis not present

## 2018-01-14 DIAGNOSIS — I132 Hypertensive heart and chronic kidney disease with heart failure and with stage 5 chronic kidney disease, or end stage renal disease: Secondary | ICD-10-CM | POA: Diagnosis not present

## 2018-01-14 DIAGNOSIS — J9691 Respiratory failure, unspecified with hypoxia: Secondary | ICD-10-CM | POA: Diagnosis not present

## 2018-01-14 DIAGNOSIS — I2510 Atherosclerotic heart disease of native coronary artery without angina pectoris: Secondary | ICD-10-CM | POA: Diagnosis not present

## 2018-01-14 DIAGNOSIS — D631 Anemia in chronic kidney disease: Secondary | ICD-10-CM | POA: Diagnosis not present

## 2018-01-14 DIAGNOSIS — I10 Essential (primary) hypertension: Secondary | ICD-10-CM | POA: Diagnosis not present

## 2018-01-14 DIAGNOSIS — I714 Abdominal aortic aneurysm, without rupture: Secondary | ICD-10-CM | POA: Diagnosis not present

## 2018-01-14 DIAGNOSIS — Z8673 Personal history of transient ischemic attack (TIA), and cerebral infarction without residual deficits: Secondary | ICD-10-CM | POA: Diagnosis not present

## 2018-01-14 DIAGNOSIS — N2581 Secondary hyperparathyroidism of renal origin: Secondary | ICD-10-CM | POA: Diagnosis not present

## 2018-01-14 DIAGNOSIS — Z992 Dependence on renal dialysis: Secondary | ICD-10-CM | POA: Diagnosis not present

## 2018-01-14 DIAGNOSIS — I5043 Acute on chronic combined systolic (congestive) and diastolic (congestive) heart failure: Secondary | ICD-10-CM | POA: Diagnosis not present

## 2018-01-14 DIAGNOSIS — J440 Chronic obstructive pulmonary disease with acute lower respiratory infection: Secondary | ICD-10-CM

## 2018-01-14 DIAGNOSIS — I69398 Other sequelae of cerebral infarction: Secondary | ICD-10-CM | POA: Diagnosis not present

## 2018-01-14 DIAGNOSIS — J209 Acute bronchitis, unspecified: Secondary | ICD-10-CM

## 2018-01-14 DIAGNOSIS — J9601 Acute respiratory failure with hypoxia: Secondary | ICD-10-CM | POA: Diagnosis not present

## 2018-01-14 DIAGNOSIS — Z7984 Long term (current) use of oral hypoglycemic drugs: Secondary | ICD-10-CM | POA: Diagnosis not present

## 2018-01-14 DIAGNOSIS — I120 Hypertensive chronic kidney disease with stage 5 chronic kidney disease or end stage renal disease: Secondary | ICD-10-CM | POA: Diagnosis not present

## 2018-01-14 DIAGNOSIS — H543 Unqualified visual loss, both eyes: Secondary | ICD-10-CM | POA: Diagnosis not present

## 2018-01-14 DIAGNOSIS — E8770 Fluid overload, unspecified: Secondary | ICD-10-CM | POA: Diagnosis not present

## 2018-01-14 DIAGNOSIS — E1151 Type 2 diabetes mellitus with diabetic peripheral angiopathy without gangrene: Secondary | ICD-10-CM | POA: Diagnosis not present

## 2018-01-14 DIAGNOSIS — R2681 Unsteadiness on feet: Secondary | ICD-10-CM | POA: Diagnosis not present

## 2018-01-14 DIAGNOSIS — R488 Other symbolic dysfunctions: Secondary | ICD-10-CM | POA: Diagnosis not present

## 2018-01-14 DIAGNOSIS — M1990 Unspecified osteoarthritis, unspecified site: Secondary | ICD-10-CM | POA: Diagnosis not present

## 2018-01-14 DIAGNOSIS — E1122 Type 2 diabetes mellitus with diabetic chronic kidney disease: Secondary | ICD-10-CM | POA: Diagnosis not present

## 2018-01-14 DIAGNOSIS — I4891 Unspecified atrial fibrillation: Secondary | ICD-10-CM | POA: Diagnosis not present

## 2018-01-14 DIAGNOSIS — J810 Acute pulmonary edema: Secondary | ICD-10-CM | POA: Diagnosis not present

## 2018-01-14 DIAGNOSIS — I5042 Chronic combined systolic (congestive) and diastolic (congestive) heart failure: Secondary | ICD-10-CM | POA: Diagnosis not present

## 2018-01-14 DIAGNOSIS — I429 Cardiomyopathy, unspecified: Secondary | ICD-10-CM | POA: Diagnosis not present

## 2018-01-14 DIAGNOSIS — I501 Left ventricular failure: Secondary | ICD-10-CM | POA: Diagnosis not present

## 2018-01-14 DIAGNOSIS — M6281 Muscle weakness (generalized): Secondary | ICD-10-CM | POA: Diagnosis not present

## 2018-01-14 MED ORDER — DOXERCALCIFEROL 4 MCG/2ML IV SOLN: 2.0000 ug | INTRAVENOUS | Status: DC

## 2018-01-14 MED ORDER — GABAPENTIN 300 MG PO CAPS: 300.0000 mg | ORAL_CAPSULE | Freq: Every day | ORAL | 0 refills | Status: DC

## 2018-01-14 MED ORDER — GABAPENTIN 300 MG PO CAPS: 300.0000 mg | ORAL_CAPSULE | Freq: Every day | ORAL | Status: DC

## 2018-01-14 NOTE — Progress Notes (Signed)
Patient was discharged to nursing home [facility]) by MD order; discharged instructions review and sent to facility with care notes; IV DIC; skin intact; patient will be transported to facility via [place]. Many attempts to call for report, unsuccessful.

## 2018-01-14 NOTE — Clinical Social Work Note (Signed)
Clinical Social Work Assessment  Patient Details  Name: Trevor [name] MRN: [id number] Date of Birth: October 06, 1946  Date of referral:  01/14/18               Reason for consult:  Facility Placement                Permission sought to share information with:  Facility Sport and exercise psychologist, Family Supports Permission granted to share information::  Yes, Verbal Permission Granted  Name::     [employer]::  [employer]  Relationship::  Son  Contact Information:  [phone]  Housing/Transportation Living arrangements for the past 2 months:  [place] of Information:  Patient Patient Interpreter Needed:  None Criminal Activity/Legal Involvement Pertinent to Current Situation/Hospitalization:  No - Comment as needed Significant Relationships:  Adult Children Lives with:  Facility Resident Do you feel safe going back to the place where you live?  Yes Need for family participation in patient care:  No (Coment)  Care giving concerns:  CSW received regarding discharge plan. Patient states he resides at [facility] and will return there at discharge. He asked CSW to contact his son and sister at time of discharge. CSW to continue to follow and assist with discharge planning needs.   Social Worker assessment / plan:  CSW spoke with patient concerning return to [name].  Employment status:  Retired Forensic scientist:  Information systems manager, Medicaid In [place] PT Recommendations:  Not assessed at this time [place] / Referral to community resources:  [place]  Patient/Family's Response to care:  Patient reported agreement with discharge plan and requests PTAR for transport.   Patient/Family's Understanding of and Emotional Response to Diagnosis, Current Treatment, and Prognosis:  Patient/family is realistic regarding therapy needs and expressed being hopeful for return to [name] placement today. Patient expressed understanding of CSW role and discharge process as well as  medical condition. No questions/concerns about plan or treatment.    Emotional Assessment Appearance:  Appears stated age Attitude/Demeanor/Rapport:  Gracious, Charismatic Affect (typically observed):  Accepting, Appropriate, Pleasant Orientation:  Oriented to Self, Oriented to Place, Oriented to  Time, Oriented to Situation Alcohol / Substance use:  Not Applicable Psych involvement (Current and /or in the community):  No (Comment)  Discharge Needs  Concerns to be addressed:  Care Coordination Readmission within the last 30 days:  No Current discharge risk:  None Barriers to Discharge:  Continued Medical Work up   [employer], [place] 01/14/2018, 11:21 AM

## 2018-01-14 NOTE — Progress Notes (Signed)
Subjective:  [name] was seen lying in bed comfortably this morning. He denies shortness of breath or chest pain. Reports he is feeling well and looking forward to going home.  Objective:  Vital signs in last 24 hours: Vitals:   01/14/18 0532 01/14/18 0859 01/14/18 1053 01/14/18 1055  BP: (!) 146/65 (!) 146/65 (!) 148/60   Pulse:    78  Resp:      Temp: 99.1 F (37.3 C) 98.6 F (37 C)    TempSrc: Oral Oral    SpO2: 100% 96%    Weight:      Height:       GEN: Well-nourished male lying in bed. Alert and oriented. No acute distress. HENT: [place]/AT. Moist mucous membranes. No visible lesions. No erythema in posterior pharynx. 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 Sclera non-icteric. Conjunctiva clear. RESP: CTAB. No increased work of breathing, on RA CV: Normal rate and regular rhythm. No m/r/g. No LE edema. ABD: Soft. Non-tender. Non-distended. Normoactive bowel sounds. EXT: No edema. Warm and well perfused. Capillary refill <2 sec. LUE AVF with +thrill. NEURO: Cranial nerves II-XII grossly intact. Able to lift all four extremities against gravity. Chronic right foot drop and numbness. No apparent audiovisual hallucinations. Speech fluent and appropriate.  PSYCH: Patient is calm and pleasant. Appropriate affect. Well-groomed; speech is appropriate and on-subject.  Assessment/Plan:  Principal Problem:   Respiratory failure with hypoxia (HCC) Active Problems:   Tobacco use disorder   Nonischemic cardiomyopathy (HCC)   Chronic combined systolic and diastolic CHF (congestive heart failure) (HCC)   End-stage renal disease on hemodialysis (HCC)   Anemia of renal disease   Essential hypertension   Pulmonary edema   Peripheral arterial disease (HCC)   Partial blindness  [name] is a 72yo male with PMH of ESRD on HD MWF, HTN, diabetes, AAA, HFrEF (TTE 2017 with LVEF 45-50%, grade 1 DD), CAD, CVA c/b partial blindness, ?atrial fibrillation, and  tobacco use disorder who presents from [facility] with several weeks of worsening respiratory infectious symptoms, with new oxygen requirement and volume overload. Improved after HD.  Acute hypoxic respiratory failure 2/2 acute on chronic heart failure exacerbation, likely precipitated by recent respiratory infection, improved In setting of ESRD on dialysis. Received HD 2/11 and 2/13 with improvement in symptoms and oxygen requirement. Now on RA with sats >95%. Flu negative. Repeat echo looks similar to previous - LVEF 45-50%, grade 1 DD. Less likely COPD exacerbation - no evidence of wheezing or coughing on pulmonary exam. No infectious s/s. Continues to be afebrile. Patient may need adjustment of dry weight. Weight 133 -> 127lb today. - Telemetry - HD, per Nephrology - Duonebs 40mL TID - Daily weights - Fluticasone 1 spray BID - D/c to [name] today  ESRD with HD MWF Nephrology challenging dry weight. Discussed with Nephrology who stated that he is okay for d/c today from their standpoint. - Nephrology consulted; appreciate their assistance - HD, per Nephrology - D/c today with plan for typical outpatient HD schedule  HTN ?Hx of atrial fibrillation Home regimen includes amlodipine 10mg  daily, imdur 30mg  daily, hydralazine 25mg  TID, and metoprolol 25mg  BID. Currently in NSR. Chart notes ?afib, completed outpatient work-up. Not currently on chronic anticoagulation. BP stable at 144/60, HR 90 after getting hemodialysis. - Telemetry - Continue home amlodipine, imdur, hydralazine, and metoprolol - Dialysis as above  CAD Troponin mildly elevated 0.03, but hx of ESRD. Home regimen includes aspirin 81mg  and plavix 75mg   daily, and atorvastatin 40mg  QHS. - Continue home aspirin and plavix - Continue home atorvastatin  Dispo: Anticipated discharge today  Trevor Ewing, MD 01/14/2018, 11:41 AM Pager: Mamie Nick [phone]

## 2018-01-14 NOTE — Progress Notes (Signed)
Patient will DC to: [facility] Anticipated DC date: 01/14/18 Family notified: Son Transport by: Corey Harold   Per MD patient ready for DC to [facility]. RN, patient, patient's family, and facility notified of DC. Discharge Summary sent to facility. RN given number for report [phone] Room 128). DC packet on chart. Ambulance transport requested for patient.   CSW signing off.  Cedric Fishman, [place] Social Worker [phone]

## 2018-01-14 NOTE — NC FL2 (Signed)
[place] MEDICAID FL2 LEVEL OF CARE SCREENING TOOL     IDENTIFICATION  Patient Name: Trevor [name] Birthdate: May 31, 1946 Sex: male Admission Date (Current Location): 01/11/2018  [facility] and [place] Number:  Herbalist and Address:  [facility], [place] [address] [place], [place] [address]      Provider Number: [id number]  Attending Physician Name and Address:  Annia Belt, MD  Relative Name and Phone Number:  Maree Erie, daughter, [phone]    Current Level of Care: Hospital Recommended Level of Care: [place] Prior Approval Number:    Date Approved/Denied:   PASRR Number: [id number]  Discharge Plan: [name]    Current Diagnoses: Patient Active Problem List   Diagnosis Date Noted  . Respiratory failure with hypoxia ([place]) 01/11/2018  . Sleep stage dysfunction 11/10/2017  . 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 Vascular device, implant, or graft complication 20/25/4270  . Peripheral arterial disease ([place]) 10/20/2017  . Non-allergic rhinitis 10/20/2017  . Pain due to onychomycosis of toenails of both feet 07/30/2017  . 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 BPH (benign prostatic hyperplasia) 07/07/2017  . At risk for adverse drug reaction 07/02/2017  . Ischemia of extremity 06/28/2017  . Pulmonary edema 06/27/2017  . Weakness of both legs 01/25/2017  . Hypertensive urgency 01/24/2017  . 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 Ischemic chest pain 11/01/2016  . Hypoxemia 09/07/2016  . Essential hypertension 03/08/2016  . Orthostatic hypotension 01/12/2016  . Dyslipidemia 01/12/2016  . Chest pain 08/12/2015  . Accelerated hypertension 08/12/2015  . Carotid stenosis   . Stroke ([place]) 04/03/2015  . Abdominal aortic aneurysm ([place]) 03/06/2015  . Generalized weakness 09/08/2014  . Acute encephalopathy 09/07/2014  . Protein-calorie malnutrition, severe ([place]) 05/20/2014  . Syncope and collapse 05/19/2014  .  End-stage renal disease on hemodialysis ([place]) 05/19/2014  . Anemia of renal disease 05/19/2014  . Coronary artery disease, non-occlusive:  09/28/2012  . Anemia 09/27/2012  . Secondary hyperparathyroidism ([place]) 09/27/2012  . Non compliance with medical treatment 09/27/2012  . Chronic combined systolic and diastolic CHF (congestive heart failure) ([place]) 10/10/2011  . A-fib ([place]) 10/08/2011  . History of stroke 10/07/2011  . Cocaine abuse ([place]) 10/07/2011  . Nonischemic cardiomyopathy ([place]) 09/18/2011  . Tobacco use disorder 08/07/2011  . Bruit 08/07/2011    Orientation RESPIRATION BLADDER Height & Weight     Time, Self, Situation, Place  Normal Continent Weight: 57.9 kg (127 lb 10.3 oz) Height:  5\' 10"  (177.8 cm)  BEHAVIORAL SYMPTOMS/MOOD NEUROLOGICAL BOWEL NUTRITION STATUS      Continent Diet(Please see DC Summary)  AMBULATORY STATUS COMMUNICATION OF NEEDS Skin   Limited Assist Verbally Normal                       Personal Care Assistance Level of Assistance  Bathing, Feeding, Dressing Bathing Assistance: Limited assistance Feeding assistance: Independent Dressing Assistance: Limited assistance     Functional Limitations Info  Sight, Hearing Sight Info: Impaired Hearing Info: Impaired      SPECIAL CARE FACTORS FREQUENCY                       Contractures      Additional Factors Info  Code Status, Allergies Code Status Info: Full Allergies Info: NKA           Current Medications (01/14/2018):  This is the current hospital active medication list Current Facility-Administered Medications  Medication Dose Route Frequency Provider Last  Rate Last Dose  . amLODipine (NORVASC) tablet 10 mg  10 mg Oral QHS Collier Salina, MD   10 mg at 01/13/18 2205  . aspirin chewable tablet 81 mg  81 mg Oral Daily Collier Salina, MD   81 mg at 01/13/18 1325  . atorvastatin (LIPITOR) tablet 40 mg  40 mg Oral QHS Collier Salina, MD   40 mg at 01/13/18 2205   . bisacodyl (DULCOLAX) suppository 10 mg  10 mg Rectal Daily PRN Collier Salina, MD      . clopidogrel (PLAVIX) tablet 75 mg  75 mg Oral Daily Collier Salina, MD   75 mg at 01/13/18 1326  . docusate sodium (COLACE) capsule 200 mg  200 mg Oral QHS Collier Salina, MD   200 mg at 01/13/18 2205  . doxercalciferol (HECTOROL) injection 2 mcg  2 mcg Intravenous Q M,W,F-HD Zeyfang, David, PA-C      . feeding supplement (PRO-STAT SUGAR FREE 64) liquid 30 mL  30 mL Oral BID Collier Salina, MD   30 mL at 01/13/18 2205  . fluticasone (FLONASE) 50 MCG/ACT nasal spray 1 spray  1 spray Each Nare BID Collier Salina, MD   1 spray at 01/13/18 2204  . gabapentin (NEURONTIN) capsule 300 mg  300 mg Oral QHS Colbert Ewing, MD      . heparin injection 5,000 Units  5,000 Units Subcutaneous Q8H Collier Salina, MD   5,000 Units at 01/14/18 4492  . hydrALAZINE (APRESOLINE) tablet 25 mg  25 mg Oral TID Collier Salina, MD   25 mg at 01/13/18 2205  . ipratropium-albuterol (DUONEB) 0.5-2.5 (3) MG/3ML nebulizer solution 3 mL  3 mL Nebulization Q4H PRN Annia Belt, MD      . isosorbide mononitrate (IMDUR) 24 hr tablet 30 mg  30 mg Oral Daily Collier Salina, MD   30 mg at 01/13/18 1326  . MEDLINE mouth rinse  15 mL Mouth Rinse BID Annia Belt, MD   15 mL at 01/13/18 1326  . metoprolol tartrate (LOPRESSOR) tablet 25 mg  25 mg Oral BID Collier Salina, MD   25 mg at 01/13/18 2205  . multivitamin (RENA-VIT) tablet 1 tablet  1 tablet Oral QHS Collier Salina, MD   1 tablet at 01/13/18 2205     Discharge Medications: Please see discharge summary for a list of discharge medications.  Relevant Imaging Results:  Relevant Lab Results:   Additional Information [phone]. Dialysis patient MWF - [facility] [place], [place]

## 2018-01-14 NOTE — Progress Notes (Signed)
[name] Progress Note   Subjective: DC orders noted. Feels better. No C/Os.   Objective Vitals:   01/14/18 0859 01/14/18 1053 01/14/18 1055 01/14/18 1158  BP: (!) 146/65 (!) 148/60  137/72  Pulse:   78 68  Resp:      Temp: 98.6 F (37 C)   98.1 F (36.7 C)  TempSrc: Oral   Oral  SpO2: 96%   96%  Weight:      Height:       Physical Exam General: pleasant elderly male in NAD KGOVP:C3,E0, 2/6 systolic M Lungs: CTAB Abdomen: Active BS Extremities: No LE edema Dialysis Access: LUA AVF + bruit   Additional Objective Labs: Basic Metabolic Panel: Recent Labs  Lab 01/11/18 1150 01/12/18 0515 01/13/18 0836  NA 134* 137 138  K 5.5* 4.3 4.4  CL 96* 94* 97*  CO2 21* 26 25  GLUCOSE 84 89 86  BUN 56* 26* 50*  CREATININE 9.46* 5.47* 8.32*  CALCIUM 9.0 9.4 9.7  PHOS  --   --  7.0*   Liver Function Tests: Recent Labs  Lab 01/11/18 1150 01/13/18 0836  AST 17  --   ALT 12*  --   ALKPHOS 81  --   BILITOT 0.9  --   PROT 6.4*  --   ALBUMIN 3.1* 2.9*   No results for input(s): LIPASE, AMYLASE in the last 168 hours. CBC: Recent Labs  Lab 01/11/18 1150 01/13/18 0837  WBC 8.2 5.3  NEUTROABS 5.7  --   HGB 10.2* 9.6*  HCT 29.9* 28.2*  MCV 85.9 86.2  PLT 202 186   Blood Culture    Component Value Date/Time   SDES HEMODIALYSIS LINE 12/27/2015 1340   SPECREQUEST BOTTLES DRAWN AEROBIC AND ANAEROBIC 10MLS 12/27/2015 1340   CULT NO GROWTH 5 DAYS 12/27/2015 1340   REPTSTATUS 01/01/2016 FINAL 12/27/2015 1340    Cardiac Enzymes: Recent Labs  Lab 01/11/18 1150  TROPONINI 0.03*   CBG: No results for input(s): GLUCAP in the last 168 hours. Iron Studies: No results for input(s): IRON, TIBC, TRANSFERRIN, FERRITIN in the last 72 hours. @lablastinr3 @ Studies/Results: No results found. Medications:  . amLODipine  10 mg Oral QHS  . aspirin  81 mg Oral Daily  . atorvastatin  40 mg Oral QHS  . clopidogrel  75 mg Oral Daily  . docusate sodium  200  mg Oral QHS  . doxercalciferol  2 mcg Intravenous Q M,W,F-HD  . feeding supplement (PRO-STAT SUGAR FREE 64)  30 mL Oral BID  . fluticasone  1 spray Each Nare BID  . gabapentin  300 mg Oral QHS  . heparin  5,000 Units Subcutaneous Q8H  . hydrALAZINE  25 mg Oral TID  . isosorbide mononitrate  30 mg Oral Daily  . mouth rinse  15 mL Mouth Rinse BID  . metoprolol tartrate  25 mg Oral BID  . multivitamin  1 tablet Oral QHS     Dialysis Orders: Center:[facility]. BTC48.1YH Bath 2k, 2,0 caTime 4hrHeparin none. AccessLUA AVF  Hec 40mcg IV/HD Mircera 76mcg q2weeks (last given 12/30/17)   Assessment/Plan 1. Hypoxic Respiratory failure sec volime overload. HD 01/11/2018 Pre wt 60.5 Net UF 2097 Post wt 59.8. Sx improved with HD. Appears comfortable on RA. Nadir wt 57.9. Make new EDW 57.5 kg.  2. ESRD -HD MWF. HD tomorrow OP center 3. Hypertension/volume - As noted above. BP well controlled.  4. Anemiahgb 10.2 ESA due tomorrow. Will give Aranesp 40 mcg IV with HD.  5.  Metabolic bone disease -Hec 2 On Hd , on sensipar and auryxia Binder  6. H/O PAD- Sp 7/29- 07/02/17 Right leg Aterial ischemia with Right axillofemoral Bypass grafting Dr. Donnetta Hutching ( dc to [facility] ). On ASA and plavix.  7. Nutrition: Albumin 3.1 renal diet, nepro, renal vits.  8.   H/O CAD 9. H/O AAA   Trevor Heal H. Breyah Akhter NP-C 01/14/2018, 12:13 PM  [employer] [phone]

## 2018-01-15 ENCOUNTER — Non-Acute Institutional Stay (SKILLED_NURSING_FACILITY): Payer: Medicare Other | Admitting: Adult Health

## 2018-01-15 ENCOUNTER — Encounter: Payer: Self-pay | Admitting: Adult Health

## 2018-01-15 DIAGNOSIS — D638 Anemia in other chronic diseases classified elsewhere: Secondary | ICD-10-CM

## 2018-01-15 DIAGNOSIS — Z8673 Personal history of transient ischemic attack (TIA), and cerebral infarction without residual deficits: Secondary | ICD-10-CM | POA: Diagnosis not present

## 2018-01-15 DIAGNOSIS — J810 Acute pulmonary edema: Secondary | ICD-10-CM

## 2018-01-15 DIAGNOSIS — I2510 Atherosclerotic heart disease of native coronary artery without angina pectoris: Secondary | ICD-10-CM

## 2018-01-15 DIAGNOSIS — I10 Essential (primary) hypertension: Secondary | ICD-10-CM

## 2018-01-15 DIAGNOSIS — N186 End stage renal disease: Secondary | ICD-10-CM

## 2018-01-15 DIAGNOSIS — Z992 Dependence on renal dialysis: Secondary | ICD-10-CM | POA: Diagnosis not present

## 2018-01-15 NOTE — Progress Notes (Signed)
Location:  [facility] Room Number: 128-B Place of Service:  [name] (31) Provider:  Durenda Age, NP  Patient Care Team: [name] as PCP - General Stanford Breed [name], MD (Cardiology)  Extended Emergency Contact Information Primary Emergency Contact: [name] Work Phone: [phone] Mobile Phone: [phone] Relation: Daughter Secondary Emergency Contact: [place] of [place] Phone: [phone] Relation: Son  Code Status:  Full Code  Goals of care: Advanced Directive information Advanced Directives 12/23/2017  Does Patient Have a Medical Advance Directive? No  Type of Advance Directive -  Does patient want to make changes to medical advance directive? -  Copy of [place] in Chart? -  Would patient like information on creating a medical advance directive? No - Patient declined  Pre-existing out of facility DNR order (yellow form or pink MOST form) -     Chief Complaint  Patient presents with  . Acute Visit    Hospital followup, status post hospitalization at [facility] 2/11-2/14/19 for respiratory failure    HPI:  Pt is a 72 y.o. male seen today for hospital followup.  He was readmitted to [facility] on 01/14/2018 following an admission at [facility] 2/11-2/14/19 for respiratory failure. He was found to have interstitial pulmonary edema on chest x-ray and BNP > 4000. Nephrology was consulted and managed with dialysis. He is a long-term care resident of [facility].  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 He was seen in the room today.     Past Medical History:  Diagnosis Date  . AAA (abdominal aortic aneurysm) ([place])   . Anxiety   . Arthritis   . Blindness   . CHF (congestive heart failure) ([place])   . CVA (cerebral infarction)    caused  blindness, 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  . ESRD on hemodialysis ([place])    started HD 2014-15  . Headache(784.0)   . HTN (hypertension)   . Protein-calorie malnutrition ([place])    Past Surgical History:  Procedure Laterality Date  . AXILLARY-FEMORAL BYPASS GRAFT  06/28/2017   Procedure: BYPASS GRAFT RIGHT AXILLA-BIFEMORAL USING 8MM X 30CM AND 8MM X 60CM HEMASHIELD GOLD GRAFTS;  Surgeon: Rosetta Posner, MD;  Location: [place];  Service: Vascular;;  . CARDIAC CATHETERIZATION N/A 11/01/2016   Procedure: Left Heart Cath and Coronary Angiography;  Surgeon: Lorretta Harp, MD;  Location: [place] CV LAB;  Service: Cardiovascular;  Laterality: N/A;  . ENDARTERECTOMY FEMORAL Bilateral 06/28/2017   Procedure: ENDARTERECTOMY BILATERAL FEMORAL ARTERIES;  Surgeon: Rosetta Posner, MD;  Location: [facility];  Service: Vascular;  Laterality: Bilateral;  . INSERTION OF DIALYSIS CATHETER  09/21/2012   Procedure: INSERTION OF DIALYSIS CATHETER;  Surgeon: Angelia Mould, MD;  Location: [facility];  Service: Vascular;  Laterality: N/A;  Right Internal Jugular Placement  . LEFT HEART CATHETERIZATION WITH CORONARY ANGIOGRAM N/A 09/28/2012   Procedure: LEFT HEART CATHETERIZATION WITH CORONARY ANGIOGRAM;  Surgeon: Sherren Mocha, MD;  Location: [facility] CATH LAB;  Service: Cardiovascular;  Laterality: N/A;  . LEFT HEART CATHETERIZATION WITH CORONARY ANGIOGRAM N/A 07/07/2014   Procedure: LEFT HEART CATHETERIZATION WITH CORONARY ANGIOGRAM;  Surgeon: Leonie Man, MD;  Location: [facility] CATH LAB;  Service: Cardiovascular;  Laterality: N/A;  . right hand    . TEE WITHOUT CARDIOVERSION  10/09/2011   Procedure: TRANSESOPHAGEAL  ECHOCARDIOGRAM (TEE);  Surgeon: Lelon Perla, MD;  Location: [facility] ENDOSCOPY;  Service: Cardiovascular;  Laterality: N/A;    No Known Allergies  Outpatient Encounter Medications as of 01/15/2018  Medication Sig  . Amino Acids-Protein Hydrolys (FEEDING SUPPLEMENT, PRO-STAT SUGAR FREE 64,) LIQD Take 30 mLs  by mouth 2 (two) times daily.  Marland Kitchen amLODipine (NORVASC) 10 MG tablet Take 10 mg by mouth at bedtime.   Marland Kitchen aspirin 81 MG chewable tablet Chew 1 tablet (81 mg total) by mouth daily.  Marland Kitchen atorvastatin (LIPITOR) 40 MG tablet Take 40 mg by mouth at bedtime.  . bisacodyl (DULCOLAX) 10 MG suppository Place 10 mg rectally daily as needed (constipation not relieved by MOM).  . clopidogrel (PLAVIX) 75 MG tablet Take 1 tablet (75 mg total) by mouth daily.  Marland Kitchen docusate sodium (COLACE) 100 MG capsule Take 200 mg by mouth at bedtime.   Marland Kitchen doxercalciferol (HECTOROL) 4 MCG/2ML injection Inject 1 mL (2 mcg total) into the vein every Monday, Wednesday, and Friday with hemodialysis.  . ferric citrate (AURYXIA) 1 GM 210 MG(Fe) tablet Take 630 mg by mouth 3 (three) times daily with meals.   . fluticasone (FLONASE) 50 MCG/ACT nasal spray Place 1 spray into both nostrils 2 (two) times daily.   Marland Kitchen gabapentin (NEURONTIN) 300 MG capsule Take 1 capsule (300 mg total) by mouth at bedtime.  . hydrALAZINE (APRESOLINE) 25 MG tablet Take 25 mg by mouth 3 (three) times daily.   . isosorbide mononitrate (IMDUR) 30 MG 24 hr tablet Take 30 mg by mouth daily.  . metoprolol tartrate (LOPRESSOR) 25 MG tablet Take 1 tablet (25 mg total) by mouth 2 (two) times daily.  . multivitamin (RENA-VIT) TABS tablet Take 1 tablet by mouth at bedtime.  . Nutritional Supplements (FEEDING SUPPLEMENT, NEPRO CARB STEADY,) LIQD Take 237 mLs by mouth 2 (two) times daily between meals.  . Omega-3 Fatty Acids (FISH OIL) 1000 MG CAPS Take 1,000 mg by mouth 2 (two) times daily.   No facility-administered encounter medications on file as of 01/15/2018.     Review of Systems  GENERAL: No change in appetite, no fatigue, no weight changes, no fever, chills or weakness MOUTH and THROAT: Denies oral discomfort, gingival pain or bleeding RESPIRATORY: no cough, SOB, DOE, wheezing, hemoptysis CARDIAC: No chest pain, edema or palpitations GI: No abdominal pain,  diarrhea, constipation, heart burn, nausea or vomiting GU: Denies dysuria, frequency, hematuria, or discharge PSYCHIATRIC: Denies feelings of depression or anxiety. No report of hallucinations, insomnia, paranoia, or agitation   Immunization History  Administered Date(s) Administered  . Influenza,inj,Quad PF,6+ Mos 08/14/2015  . Influenza-Unspecified 08/31/2016, 08/01/2017  . PPD Test 01/17/2016, 07/02/2017  . Pneumococcal Polysaccharide-23 08/14/2015  . Tdap 10/12/2017   Pertinent  Health Maintenance Due  Topic Date Due  . PNA vac Low Risk Adult (2 of 2 - PCV13) 08/13/2018 (Originally 08/13/2016)  . COLONOSCOPY  08/08/2027 (Originally 11/06/1995)  . INFLUENZA VACCINE  Completed   Fall Risk  09/29/2017  Falls in the past year? Yes  Number falls in past yr: 2 or more  Injury with Fall? No     Vitals:   01/15/18 1409  BP: (!) 143/69  Pulse: 68  Resp: 18  Temp: 98 F (36.7 C)  TempSrc: Oral  Weight: 127 lb 10.4 oz (57.9 kg)  Height: 5\' 10"  (1.778 m)   Body mass index is 18.32 kg/m.  Physical Exam  GENERAL APPEARANCE:  In no acute distress.  SKIN:  Skin is warm and dry.  MOUTH and THROAT: Lips are without lesions. Oral mucosa is moist and without lesions.  RESPIRATORY: Breathing is even & unlabored, BS CTAB CARDIAC: RRR, no murmur,no extra heart sounds, no edema, left upper arm with AV fistula + bruit/thrill GI: Abdomen soft, normal BS, no masses, no tenderness EXTREMITIES:  Able to move X 4 extremities, right foot with AFO NEUROLOGICAL: Right hemiparesis PSYCHIATRIC: Alert and oriented X 3. Affect and behavior are appropriate   Labs reviewed: Recent Labs    01/11/18 1150 01/12/18 0515 01/13/18 0836  NA 134* 137 138  K 5.5* 4.3 4.4  CL 96* 94* 97*  CO2 21* 26 25  GLUCOSE 84 89 86  BUN 56* 26* 50*  CREATININE 9.46* 5.47* 8.32*  CALCIUM 9.0 9.4 9.7  PHOS  --   --  7.0*   Recent Labs    06/29/17 0439 11/01/17 1130 01/11/18 1150 01/13/18 0836  AST 37  18 17  --   ALT 13* 13* 12*  --   ALKPHOS 61 106 81  --   BILITOT 0.8 1.3* 0.9  --   PROT 5.9* 7.5 6.4*  --   ALBUMIN 3.0* 3.2* 3.1* 2.9*   Recent Labs    09/14/17 1846  11/01/17 1130  11/03/17 0808 01/11/18 1150 01/13/18 0837  WBC 5.4  --  10.7*   < > 6.8 8.2 5.3  NEUTROABS 2.9  --  8.9*  --   --  5.7  --   HGB 11.7*   < > 11.8*   < > 11.2* 10.2* 9.6*  HCT 34.0*   < > 34.7*   < > 33.2* 29.9* 28.2*  MCV 84.8  --  86.5   < > 86.0 85.9 86.2  PLT 196  --  194   < > 212 202 186   < > = values in this interval not displayed.   Lab Results  Component Value Date   TSH 1.685 01/25/2017   Lab Results  Component Value Date   HGBA1C 4.4 07/31/2017   Lab Results  Component Value Date   CHOL 135 01/24/2017   HDL 37 (L) 01/24/2017   LDLCALC 74 01/24/2017   TRIG 119 01/24/2017   CHOLHDL 3.6 01/24/2017    Significant Diagnostic Results in last 30 days:  Dg Chest 2 View  Result Date: 01/11/2018 CLINICAL DATA:  Chest pain and productive cough for 2 weeks. Dyspnea. EXAM: CHEST  2 VIEW COMPARISON:  12/23/2017 FINDINGS: The heart size and mediastinal contours are within normal limits. Aortic atherosclerosis. New diffuse interstitial infiltrates and Kerley B-lines are consistent with diffuse interstitial edema. No evidence of pulmonary consolidation or pleural effusion. IMPRESSION: New diffuse pulmonary interstitial edema. No focal consolidation or pleural effusion. Electronically Signed   By: Earle Gell M.D.   On: 01/11/2018 12:55   Dg Chest 2 View  Result Date: 12/23/2017 CLINICAL DATA:  Cough x3 weeks EXAM: CHEST  2 VIEW COMPARISON:  09/03/2017 FINDINGS: The heart size and mediastinal contours are within normal limits. Atherosclerotic, tortuous nonaneurysmal aorta. Faint bibasilar pulmonary opacities may reflect minimal pneumonia and/or atelectasis, right greater than left. The visualized skeletal structures are unremarkable. IMPRESSION: Faint bibasilar airspace opacities suspicious for  minimal foci of pneumonia, right greater than left. Aortic atherosclerosis. No pulmonary edema, effusion or pneumothorax. Electronically Signed   By: Ashley Royalty M.D.   On: 12/23/2017 19:17    Assessment/Plan  1. Acute pulmonary edema [facility]) - nephrology was consulted and was managed with dialysis   2. End-stage renal  disease on hemodialysis ([place]) - hemodialysis  3X/week, MWF, continue Doxercalciferol 4 g/2 mL injection at dialysis, MWF   3. Essential hypertension - continue metoprolol titrate 25 mg 1 tab twice a day, Norvasc 10 mg 1 tab Q HS, hydralazine 25 mg 1 tab 3 times a day   4. History of CVA (cerebrovascular accident) - U Plavix 75 mg 1 tab daily, aspirin 81 mg 1 tab daily, Lipitor 40 mg 1 tab daily at bedtime,  hydralazine 25 mg 1 tab 3 times a day, Norvasc 10 mg 1 tab daily and metoprolol tartrate 25 mg 1 tablet twice a day   5. Anemia in other chronic diseases classified elsewhere - continue Auryxia 210 mg 3 tabs 3 times a day Lab Results  Component Value Date   HGB 9.6 (L) 01/13/2018     6. Coronary artery disease, non-occlusive - continue isosorbide mononitrate ER 30 mg 1 tab daily, aspirin 81 mg 1 tab daily     Family/ staff Communication:  Discussed plan of care with charge nurse.  Labs/tests ordered:  None  Goals of care:  Long-term care   Durenda Age, NP [facility] [phone] (Monday-Friday 8:00 a.m. - 5:00 p.m.) [phone] (after hours)

## 2018-01-19 ENCOUNTER — Non-Acute Institutional Stay (SKILLED_NURSING_FACILITY): Payer: Medicare Other | Admitting: Internal Medicine

## 2018-01-19 ENCOUNTER — Encounter: Payer: Self-pay | Admitting: Internal Medicine

## 2018-01-19 DIAGNOSIS — I5042 Chronic combined systolic (congestive) and diastolic (congestive) heart failure: Secondary | ICD-10-CM | POA: Diagnosis not present

## 2018-01-19 DIAGNOSIS — N186 End stage renal disease: Secondary | ICD-10-CM | POA: Diagnosis not present

## 2018-01-19 DIAGNOSIS — J209 Acute bronchitis, unspecified: Secondary | ICD-10-CM | POA: Diagnosis not present

## 2018-01-19 DIAGNOSIS — Z992 Dependence on renal dialysis: Secondary | ICD-10-CM | POA: Diagnosis not present

## 2018-01-19 NOTE — Patient Instructions (Signed)
See assessment and plan under each diagnosis in the problem list and acutely for this visit 

## 2018-01-19 NOTE — Assessment & Plan Note (Signed)
Monitor dry weight at hemodialysis, Monday, Wednesday, and Friday

## 2018-01-19 NOTE — Progress Notes (Signed)
NURSING HOME LOCATION:  [facility] ROOM NUMBER:  128-B  CODE STATUS:  Full Code  PCP:  [name]  [address] [place] [address]  Nursing Facility readmission within 30 days  Interim medical record and care since last [place] visit was updated with review of diagnostic studies and change in clinical status since last visit were documented.  HPI: Patient was hospitalized 2/11-2/14/19 with acute pulmonary edema with acute on chronic heart failure exacerbation. Respiratory failure was felt to been exacerbated by recent respiratory illness for which he received antibiotics.  Today he states that he had a cough which was awakening him at night. This was productive of white sputum. He went to the hospital before his dialysis session on Monday. He had eaten 2 pieces of pizza over the weekend. He denied significant sodium intake. At the bedside was a bag of salted popcorn which has 300 mg of sodium per serving. He states he smokes only 1-2 cigarettes per day. Chest x-ray revealed interstitial pulmonary edema, BNP was over 4000. Echocardiogram was unchanged, ejection fraction was 45-50 percent with diffuse hypokinesis and grade 1 diastolic dysfunction. With hemodialysis there was significant and rapid improvement in his respiratory symptoms. Dry weights will continue be monitored at hemodialysis every Monday, Wednesday, and Friday. Discharge creatinine was 8.32, BUN 50, GFR 7, hemoglobin 9.6 and hematocrit 28.2. Indices were normochromic, normocytic.  Review of systems: He denies any significant cough or other cardiopulmonary symptoms now. Specifically he denies paroxysmal nocturnal dyspnea or edema.  Constitutional: No fever, significant weight change, fatigue  Eyes: No redness, discharge, pain, vision change ENT/mouth: No nasal congestion,  purulent discharge, earache, change in hearing, sore throat  Cardiovascular: No chest pain,  palpitations  Respiratory: No cough, sputum production, hemoptysis, DOE , significant snoring, apnea   Gastrointestinal: No heartburn, dysphagia, abdominal pain, nausea /vomiting, rectal bleeding, melena, change in bowels Genitourinary: anuric Musculoskeletal: No joint stiffness, joint swelling, weakness, pain Dermatologic: No rash, pruritus, change in appearance of skin Neurologic: No dizziness, headache, syncope, seizures, numbness, tingling Psychiatric: No significant anxiety, depression, insomnia, anorexia Endocrine: No change in hair/skin/ nails, excessive thirst, excessive hunger, excessive urination  Hematologic/lymphatic: No significant bruising, lymphadenopathy, abnormal bleeding Allergy/immunology: No itchy/watery eyes, significant sneezing, urticaria, angioedema  Physical exam:  Pertinent or positive findings: He was comfortable reclining at 10. There is no significant neck vein distention. He has arcus senilis. Dentures are present. He has a grade 3/0-1 systolic murmur. He has loud carotid bruits. Anteriorly has low-grade expiratory rhonchi. Breath sounds are decreased posteriorly. He has no peripheral edema. Pedal pulses are decreased. Shunt is present in the left upper extremity. He is generally weak but able to sit up without help.   General appearance: Adequately nourished; no acute distress, increased work of breathing is present.   Lymphatic: No lymphadenopathy about the head, neck, axilla. Eyes: No conjunctival inflammation or lid edema is present. There is no scleral icterus. Ears:  External ear exam shows no significant lesions or deformities.   Nose:  External nasal examination shows no deformity or inflammation. Nasal mucosa are pink and moist without lesions, exudates Oral exam:  Lips and gums are healthy appearing. There is no oropharyngeal erythema or exudate. Neck:  No thyromegaly, masses, tenderness noted.    Heart:  Normal rate and regular rhythm. S1 and S2 normal  without gallop, click, rub .  Lungs: without wheezes, rales , rubs. Abdomen:Bowel sounds are normal. Abdomen is soft and nontender with no organomegaly,  hernias,masses. GU: deferred  Extremities:  No cyanosis, clubbing  Skin: Warm & dry w/o tenting. No significant lesions or rash.  See summary under each active problem in the Problem List with associated updated therapeutic plan

## 2018-01-19 NOTE — Assessment & Plan Note (Signed)
Low sodium  (< 2000 mg daily)discussed As his vision is impaired, I asked him to have people read sodium content on packaged foods Advised him to avoid anything that tasted salty or vinegary

## 2018-01-19 NOTE — Assessment & Plan Note (Addendum)
Clinically resolved Mild rhonchi in context of continued smoking

## 2018-01-20 ENCOUNTER — Encounter: Payer: Self-pay | Admitting: Internal Medicine

## 2018-01-27 ENCOUNTER — Encounter: Payer: Self-pay | Admitting: Adult Health

## 2018-01-27 NOTE — Progress Notes (Signed)
This encounter was created in error - please disregard.

## 2018-01-28 ENCOUNTER — Encounter: Payer: Self-pay | Admitting: Adult Health

## 2018-01-28 NOTE — Progress Notes (Deleted)
Location:  [place] Room Number: 128-B Place of Service:  [name] (31) Provider:  Durenda Age, NP  Patient Care Team: [name] as PCP - General Stanford Breed [name], MD (Cardiology)  Extended Emergency Contact Information Primary Emergency Contact: [name] Work Phone: [phone] Mobile Phone: [phone] Relation: Daughter Secondary Emergency Contact: [place] Phone: [phone] Relation: Son  Code Status:  Full Code  Goals of care: Advanced Directive information Advanced Directives 12/23/2017  Does Patient Have a Medical Advance Directive? No  Type of Advance Directive -  Does patient want to make changes to medical advance directive? -  Copy of [place] in Chart? -  Would patient like information on creating a medical advance directive? No - Patient declined  Pre-existing out of facility DNR order (yellow form or pink MOST form) -     Chief Complaint  Patient presents with  . Discharge Note    Patient is transferring to another [name]    HPI:  Pt is a 72 y.o. male seen today for transfer to another facility due to noncompliance with [facility]'s tobacco policy.  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     Past Medical History:  Diagnosis Date  . AAA (abdominal aortic aneurysm) ([place])   . Anxiety   . Arthritis   . Blindness   . CHF (congestive heart failure) ([place])   . 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 ESRD on hemodialysis ([place])    started HD 2014-15  . Headache(784.0)   . HTN (hypertension)   . Protein-calorie malnutrition ([place])    Past Surgical History:  Procedure Laterality Date  . AXILLARY-FEMORAL BYPASS GRAFT  06/28/2017   Procedure: BYPASS GRAFT RIGHT AXILLA-BIFEMORAL USING 8MM X 30CM AND 8MM X 60CM  HEMASHIELD GOLD GRAFTS;  Surgeon: Rosetta Posner, MD;  Location: [place];  Service: Vascular;;  . CARDIAC CATHETERIZATION N/A 11/01/2016   Procedure: Left Heart Cath and Coronary Angiography;  Surgeon: Lorretta Harp, MD;  Location: [place] CV LAB;  Service: Cardiovascular;  Laterality: N/A;  . ENDARTERECTOMY FEMORAL Bilateral 06/28/2017   Procedure: ENDARTERECTOMY BILATERAL FEMORAL ARTERIES;  Surgeon: Rosetta Posner, MD;  Location: [place];  Service: Vascular;  Laterality: Bilateral;  . INSERTION OF DIALYSIS CATHETER  09/21/2012   Procedure: INSERTION OF DIALYSIS CATHETER;  Surgeon: Angelia Mould, MD;  Location: [place];  Service: Vascular;  Laterality: N/A;  Right Internal Jugular Placement  . LEFT HEART CATHETERIZATION WITH CORONARY ANGIOGRAM N/A 09/28/2012   Procedure: LEFT HEART CATHETERIZATION WITH CORONARY ANGIOGRAM;  Surgeon: Sherren Mocha, MD;  Location: [facility] CATH LAB;  Service: Cardiovascular;  Laterality: N/A;  . LEFT HEART CATHETERIZATION WITH CORONARY ANGIOGRAM N/A 07/07/2014   Procedure: LEFT HEART CATHETERIZATION WITH CORONARY ANGIOGRAM;  Surgeon: Leonie Man, MD;  Location: [facility] CATH LAB;  Service: Cardiovascular;  Laterality: N/A;  . right hand    . TEE WITHOUT CARDIOVERSION  10/09/2011   Procedure: TRANSESOPHAGEAL ECHOCARDIOGRAM (TEE);  Surgeon: Lelon Perla, MD;  Location: [facility] ENDOSCOPY;  Service: Cardiovascular;  Laterality: N/A;    No Known Allergies  Outpatient Encounter Medications as of 01/28/2018  Medication Sig  . Amino Acids-Protein Hydrolys (FEEDING SUPPLEMENT, PRO-STAT SUGAR FREE 64,) LIQD Take 30 mLs by mouth 2 (two) times daily.  Marland Kitchen amLODipine (  NORVASC) 10 MG tablet Take 10 mg by mouth at bedtime.   Marland Kitchen aspirin 81 MG chewable tablet Chew 1 tablet (81 mg total) by mouth daily.  Marland Kitchen atorvastatin (LIPITOR) 40 MG tablet Take 40 mg by mouth at bedtime.  . bisacodyl (DULCOLAX) 10 MG suppository Place 10 mg rectally daily as needed (constipation not relieved by MOM).    . clopidogrel (PLAVIX) 75 MG tablet Take 1 tablet (75 mg total) by mouth daily.  Marland Kitchen docusate sodium (COLACE) 100 MG capsule Take 200 mg by mouth at bedtime.   Marland Kitchen doxercalciferol (HECTOROL) 4 MCG/2ML injection Inject 1 mL (2 mcg total) into the vein every Monday, Wednesday, and Friday with hemodialysis.  . ferric citrate (AURYXIA) 1 GM 210 MG(Fe) tablet Take 630 mg by mouth 3 (three) times daily with meals.   . fluticasone (FLONASE) 50 MCG/ACT nasal spray Place 1 spray into both nostrils 2 (two) times daily.   Marland Kitchen gabapentin (NEURONTIN) 300 MG capsule Take 1 capsule (300 mg total) by mouth at bedtime.  . hydrALAZINE (APRESOLINE) 25 MG tablet Take 25 mg by mouth 3 (three) times daily.   . isosorbide mononitrate (IMDUR) 30 MG 24 hr tablet Take 30 mg by mouth daily.  . metoprolol tartrate (LOPRESSOR) 25 MG tablet Take 1 tablet (25 mg total) by mouth 2 (two) times daily.  . multivitamin (RENA-VIT) TABS tablet Take 1 tablet by mouth at bedtime.  . Nutritional Supplements (FEEDING SUPPLEMENT, NEPRO CARB STEADY,) LIQD Take 237 mLs by mouth 2 (two) times daily between meals.  . Omega-3 Fatty Acids (FISH OIL) 1000 MG CAPS Take 1,000 mg by mouth 2 (two) times daily.   No facility-administered encounter medications on file as of 01/28/2018.     Review of Systems  GENERAL: No change in appetite, no fatigue, no weight changes, no fever, chills or weakness SKIN: Denies rash, itching, wounds, ulcer sores, or nail abnormalities EYES: Denies change in vision, dry eyes, eye pain, itching or discharge EARS: Denies change in hearing, ringing in ears, or earache NOSE: Denies nasal congestion or epistaxis MOUTH and THROAT: Denies oral discomfort, gingival pain or bleeding, pain from teeth or hoarseness   RESPIRATORY: no cough, SOB, DOE, wheezing, hemoptysis CARDIAC: No chest pain, edema or palpitations GI: No abdominal pain, diarrhea, constipation, heart burn, nausea or vomiting GU: Denies dysuria, frequency,  hematuria, incontinence, or discharge MUSCULOSKELETAL: Denies joint pain, muscle pain, back pain, restricted movement, or unusual weakness CIRCULATION: Denies claudication, edema of legs, varicosities, or cold extremities NEUROLOGICAL: Denies dizziness, syncope, numbness, or headache PSYCHIATRIC: Denies feelings of depression or anxiety. No report of hallucinations, insomnia, paranoia, or agitation ENDOCRINE: Denies polyphagia, polyuria, polydipsia, heat or cold intolerance HEME/LYMPH: Denies excessive bruising, petechia, enlarged lymph nodes, or bleeding problems IMMUNOLOGIC: Denies history of frequent infections, AIDS, or use of immunosuppressive agents   Immunization History  Administered Date(s) Administered  . Influenza,inj,Quad PF,6+ Mos 08/14/2015  . Influenza-Unspecified 08/31/2016, 08/01/2017  . PPD Test 01/17/2016, 07/02/2017  . Pneumococcal Polysaccharide-23 08/14/2015  . Tdap 10/12/2017   Pertinent  Health Maintenance Due  Topic Date Due  . PNA vac Low Risk Adult (2 of 2 - PCV13) 08/13/2018 (Originally 08/13/2016)  . COLONOSCOPY  08/08/2027 (Originally 11/06/1995)  . INFLUENZA VACCINE  Completed   Fall Risk  09/29/2017  Falls in the past year? Yes  Number falls in past yr: 2 or more  Injury with Fall? No      Vitals:   01/28/18 0946  Weight: 133 lb 1.6 oz (60.4 kg)  Height: 5\' 10"  (1.778 m)   Body mass index is 19.1 kg/m.  Physical Exam  GENERAL APPEARANCE: Well nourished. In no acute distress. Normal body habitus SKIN:  Skin is warm and dry. There are no suspicious lesions or rash HEAD: Normal in size and contour. No evidence of trauma EYES: Lids open and close normally. No blepharitis, entropion or ectropion. PERRL. Conjunctivae are clear and sclerae are white. Lenses are without opacity EARS: Pinnae are normal. Patient hears normal voice tunes of the examiner MOUTH and THROAT: Lips are without lesions. Oral mucosa is moist and without lesions. Tongue is  normal in shape, size, and color and without lesions NECK: supple, trachea midline, no neck masses, no thyroid tenderness, no thyromegaly LYMPHATICS: No LAN in the neck, no supraclavicular LAN RESPIRATORY: Breathing is even & unlabored, BS CTAB CARDIAC: RRR, no murmur,no extra heart sounds, no edema GI: Abdomen soft, normal BS, no masses, no tenderness, no hepatomegaly, no splenomegaly MUSCULOSKELETAL: No deformities. Movement at each extremity is full and painless. Strength is 5/5 at each extremity. Back is without kyphosis or scoliosis CIRCULATION: Pedal pulses are 2+. There is no edema of the legs, ankles and feet NEUROLOGICAL: There is no tremor. Speech is clear PSYCHIATRIC: Alert and oriented X 3. Affect and behavior are appropriate  Labs reviewed: Recent Labs    01/11/18 1150 01/12/18 0515 01/13/18 0836  NA 134* 137 138  K 5.5* 4.3 4.4  CL 96* 94* 97*  CO2 21* 26 25  GLUCOSE 84 89 86  BUN 56* 26* 50*  CREATININE 9.46* 5.47* 8.32*  CALCIUM 9.0 9.4 9.7  PHOS  --   --  7.0*   Recent Labs    06/29/17 0439 11/01/17 1130 01/11/18 1150 01/13/18 0836  AST 37 18 17  --   ALT 13* 13* 12*  --   ALKPHOS 61 106 81  --   BILITOT 0.8 1.3* 0.9  --   PROT 5.9* 7.5 6.4*  --   ALBUMIN 3.0* 3.2* 3.1* 2.9*   Recent Labs    09/14/17 1846  11/01/17 1130  11/03/17 0808 01/11/18 1150 01/13/18 0837  WBC 5.4  --  10.7*   < > 6.8 8.2 5.3  NEUTROABS 2.9  --  8.9*  --   --  5.7  --   HGB 11.7*   < > 11.8*   < > 11.2* 10.2* 9.6*  HCT 34.0*   < > 34.7*   < > 33.2* 29.9* 28.2*  MCV 84.8  --  86.5   < > 86.0 85.9 86.2  PLT 196  --  194   < > 212 202 186   < > = values in this interval not displayed.   Lab Results  Component Value Date   TSH 1.685 01/25/2017   Lab Results  Component Value Date   HGBA1C 4.4 07/31/2017   Lab Results  Component Value Date   CHOL 135 01/24/2017   HDL 37 (L) 01/24/2017   LDLCALC 74 01/24/2017   TRIG 119 01/24/2017   CHOLHDL 3.6 01/24/2017     Significant Diagnostic Results in last 30 days:  Dg Chest 2 View  Result Date: 01/11/2018 CLINICAL DATA:  Chest pain and productive cough for 2 weeks. Dyspnea. EXAM: CHEST  2 VIEW COMPARISON:  12/23/2017 FINDINGS: The heart size and mediastinal contours are within normal limits. Aortic atherosclerosis. New diffuse interstitial infiltrates and Kerley B-lines are consistent with diffuse interstitial edema. No evidence of pulmonary consolidation or pleural effusion. IMPRESSION: New  diffuse pulmonary interstitial edema. No focal consolidation or pleural effusion. Electronically Signed   By: Earle Gell M.D.   On: 01/11/2018 12:55    Assessment/Plan ***   Family/ staff Communication: ***  Labs/tests ordered:  ***  Goals of care:   ***   Durenda Age, NP [facility] and Adult Medicine [phone] (Monday-Friday 8:00 a.m. - 5:00 p.m.) [phone] (after hours)  This encounter was created in error - please disregard.

## 2018-01-29 DIAGNOSIS — I639 Cerebral infarction, unspecified: Secondary | ICD-10-CM

## 2018-01-29 HISTORY — DX: Cerebral infarction, unspecified: I63.9

## 2018-02-04 NOTE — Progress Notes (Signed)
This encounter was created in error - please disregard.

## 2018-02-08 ENCOUNTER — Encounter: Payer: Self-pay | Admitting: Adult Health

## 2018-02-08 NOTE — Progress Notes (Deleted)
Location:  [place] Room Number: 128-B Place of Service:  [name] (31) Provider:  Durenda Age, NP  Patient Care Team: [name] as PCP - General Stanford Breed [name], MD (Cardiology)  Extended Emergency Contact Information Primary Emergency Contact: [name] Work Phone: [phone] Mobile Phone: [phone] Relation: Daughter Secondary Emergency Contact: [place] of [place] Phone: [phone] Relation: Son  Code Status:  Full Code Goals of care: Advanced Directive information Advanced Directives 12/23/2017  Does Patient Have a Medical Advance Directive? No  Type of Advance Directive -  Does patient want to make changes to medical advance directive? -  Copy of [place] in Chart? -  Would patient like information on creating a medical advance directive? No - Patient declined  Pre-existing out of facility DNR order (yellow form or pink MOST form) -     Chief Complaint  Patient presents with  . Discharge Note    Patient is transferring to [facility] [name]    HPI:  Pt is a 72 y.o. male seen today for a discharge visit.  He is transferring to [name] on 02/09/18.  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     Past Medical History:  Diagnosis Date  . AAA (abdominal aortic aneurysm) ([place])   . Anxiety   . Arthritis   . Blindness   . CHF (congestive heart failure) ([place])   . 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 ESRD on hemodialysis ([place])    started HD 2014-15  . Headache(784.0)   . HTN (hypertension)   . Protein-calorie malnutrition ([place])    Past Surgical History:  Procedure Laterality Date  . AXILLARY-FEMORAL BYPASS GRAFT  06/28/2017   Procedure: BYPASS GRAFT RIGHT AXILLA-BIFEMORAL USING 8MM X 30CM AND 8MM X 60CM  HEMASHIELD GOLD GRAFTS;  Surgeon: Rosetta Posner, MD;  Location: [place];  Service: Vascular;;  . CARDIAC CATHETERIZATION N/A 11/01/2016   Procedure: Left Heart Cath and Coronary Angiography;  Surgeon: Lorretta Harp, MD;  Location: [place] CV LAB;  Service: Cardiovascular;  Laterality: N/A;  . ENDARTERECTOMY FEMORAL Bilateral 06/28/2017   Procedure: ENDARTERECTOMY BILATERAL FEMORAL ARTERIES;  Surgeon: Rosetta Posner, MD;  Location: [place];  Service: Vascular;  Laterality: Bilateral;  . INSERTION OF DIALYSIS CATHETER  09/21/2012   Procedure: INSERTION OF DIALYSIS CATHETER;  Surgeon: Angelia Mould, MD;  Location: [place];  Service: Vascular;  Laterality: N/A;  Right Internal Jugular Placement  . LEFT HEART CATHETERIZATION WITH CORONARY ANGIOGRAM N/A 09/28/2012   Procedure: LEFT HEART CATHETERIZATION WITH CORONARY ANGIOGRAM;  Surgeon: Sherren Mocha, MD;  Location: [facility] CATH LAB;  Service: Cardiovascular;  Laterality: N/A;  . LEFT HEART CATHETERIZATION WITH CORONARY ANGIOGRAM N/A 07/07/2014   Procedure: LEFT HEART CATHETERIZATION WITH CORONARY ANGIOGRAM;  Surgeon: Leonie Man, MD;  Location: [facility] CATH LAB;  Service: Cardiovascular;  Laterality: N/A;  . right hand    . TEE WITHOUT CARDIOVERSION  10/09/2011   Procedure: TRANSESOPHAGEAL ECHOCARDIOGRAM (TEE);  Surgeon: Lelon Perla, MD;  Location: [facility] ENDOSCOPY;  Service: Cardiovascular;  Laterality: N/A;    No Known Allergies  Outpatient Encounter Medications as of 02/08/2018  Medication Sig  . Amino Acids-Protein Hydrolys (FEEDING SUPPLEMENT, PRO-STAT SUGAR FREE 64,) LIQD Take 30 mLs by mouth 2 (two) times  daily.  . amLODipine (NORVASC) 10 MG tablet Take 10 mg by mouth at bedtime.   Marland Kitchen aspirin 81 MG chewable tablet Chew 1 tablet (81 mg total) by mouth daily.  Marland Kitchen atorvastatin (LIPITOR) 40 MG tablet Take 40 mg by mouth at bedtime.  . bisacodyl (DULCOLAX) 10 MG suppository Place 10 mg rectally daily as needed (constipation not relieved by MOM).    . clopidogrel (PLAVIX) 75 MG tablet Take 1 tablet (75 mg total) by mouth daily.  Marland Kitchen docusate sodium (COLACE) 100 MG capsule Take 200 mg by mouth at bedtime.   Marland Kitchen doxercalciferol (HECTOROL) 4 MCG/2ML injection Inject 1 mL (2 mcg total) into the vein every Monday, Wednesday, and Friday with hemodialysis.  . ferric citrate (AURYXIA) 1 GM 210 MG(Fe) tablet Take 630 mg by mouth 3 (three) times daily with meals.   . fluticasone (FLONASE) 50 MCG/ACT nasal spray Place 1 spray into both nostrils 2 (two) times daily.   Marland Kitchen gabapentin (NEURONTIN) 300 MG capsule Take 1 capsule (300 mg total) by mouth at bedtime.  . hydrALAZINE (APRESOLINE) 25 MG tablet Take 25 mg by mouth 3 (three) times daily.   . isosorbide mononitrate (IMDUR) 30 MG 24 hr tablet Take 30 mg by mouth daily.  . metoprolol tartrate (LOPRESSOR) 25 MG tablet Take 1 tablet (25 mg total) by mouth 2 (two) times daily.  . multivitamin (RENA-VIT) TABS tablet Take 1 tablet by mouth at bedtime.  . Nutritional Supplements (FEEDING SUPPLEMENT, NEPRO CARB STEADY,) LIQD Take 237 mLs by mouth 2 (two) times daily between meals.  . Omega-3 Fatty Acids (FISH OIL) 1000 MG CAPS Take 1,000 mg by mouth 2 (two) times daily.   No facility-administered encounter medications on file as of 02/08/2018.     Review of Systems  GENERAL: No change in appetite, no fatigue, no weight changes, no fever, chills or weakness SKIN: Denies rash, itching, wounds, ulcer sores, or nail abnormalities EYES: Denies change in vision, dry eyes, eye pain, itching or discharge EARS: Denies change in hearing, ringing in ears, or earache NOSE: Denies nasal congestion or epistaxis MOUTH and THROAT: Denies oral discomfort, gingival pain or bleeding, pain from teeth or hoarseness   RESPIRATORY: no cough, SOB, DOE, wheezing, hemoptysis CARDIAC: No chest pain, edema or palpitations GI: No abdominal pain, diarrhea, constipation, heart burn, nausea or vomiting GU: Denies dysuria, frequency,  hematuria, incontinence, or discharge MUSCULOSKELETAL: Denies joint pain, muscle pain, back pain, restricted movement, or unusual weakness CIRCULATION: Denies claudication, edema of legs, varicosities, or cold extremities NEUROLOGICAL: Denies dizziness, syncope, numbness, or headache PSYCHIATRIC: Denies feelings of depression or anxiety. No report of hallucinations, insomnia, paranoia, or agitation ENDOCRINE: Denies polyphagia, polyuria, polydipsia, heat or cold intolerance HEME/LYMPH: Denies excessive bruising, petechia, enlarged lymph nodes, or bleeding problems IMMUNOLOGIC: Denies history of frequent infections, AIDS, or use of immunosuppressive agents   Immunization History  Administered Date(s) Administered  . Influenza,inj,Quad PF,6+ Mos 08/14/2015  . Influenza-Unspecified 08/31/2016, 08/01/2017  . PPD Test 01/17/2016, 07/02/2017  . Pneumococcal Polysaccharide-23 08/14/2015  . Tdap 10/12/2017   Pertinent  Health Maintenance Due  Topic Date Due  . URINE MICROALBUMIN  03/11/2018 (Originally 11/06/1956)  . PNA vac Low Risk Adult (2 of 2 - PCV13) 08/13/2018 (Originally 08/13/2016)  . COLONOSCOPY  08/08/2027 (Originally 11/06/1995)  . INFLUENZA VACCINE  Completed   Fall Risk  09/29/2017  Falls in the past year? Yes  Number falls in past yr: 2 or more  Injury with Fall? No      Vitals:  02/08/18 1440  Weight: 130 lb 7.4 oz (59.2 kg)  Height: 5\' 10"  (1.778 m)   Body mass index is 18.72 kg/m.  Physical Exam  GENERAL APPEARANCE: Well nourished. In no acute distress. Normal body habitus SKIN:  Skin is warm and dry. There are no suspicious lesions or rash HEAD: Normal in size and contour. No evidence of trauma EYES: Lids open and close normally. No blepharitis, entropion or ectropion. PERRL. Conjunctivae are clear and sclerae are white. Lenses are without opacity EARS: Pinnae are normal. Patient hears normal voice tunes of the examiner MOUTH and THROAT: Lips are without  lesions. Oral mucosa is moist and without lesions. Tongue is normal in shape, size, and color and without lesions NECK: supple, trachea midline, no neck masses, no thyroid tenderness, no thyromegaly LYMPHATICS: No LAN in the neck, no supraclavicular LAN RESPIRATORY: Breathing is even & unlabored, BS CTAB CARDIAC: RRR, no murmur,no extra heart sounds, no edema GI: Abdomen soft, normal BS, no masses, no tenderness, no hepatomegaly, no splenomegaly MUSCULOSKELETAL: No deformities. Movement at each extremity is full and painless. Strength is 5/5 at each extremity. Back is without kyphosis or scoliosis CIRCULATION: Pedal pulses are 2+. There is no edema of the legs, ankles and feet NEUROLOGICAL: There is no tremor. Speech is clear PSYCHIATRIC: Alert and oriented X 3. Affect and behavior are appropriate  Labs reviewed: Recent Labs    01/11/18 1150 01/12/18 0515 01/13/18 0836  NA 134* 137 138  K 5.5* 4.3 4.4  CL 96* 94* 97*  CO2 21* 26 25  GLUCOSE 84 89 86  BUN 56* 26* 50*  CREATININE 9.46* 5.47* 8.32*  CALCIUM 9.0 9.4 9.7  PHOS  --   --  7.0*   Recent Labs    06/29/17 0439 11/01/17 1130 01/11/18 1150 01/13/18 0836  AST 37 18 17  --   ALT 13* 13* 12*  --   ALKPHOS 61 106 81  --   BILITOT 0.8 1.3* 0.9  --   PROT 5.9* 7.5 6.4*  --   ALBUMIN 3.0* 3.2* 3.1* 2.9*   Recent Labs    09/14/17 1846  11/01/17 1130  11/03/17 0808 01/11/18 1150 01/13/18 0837  WBC 5.4  --  10.7*   < > 6.8 8.2 5.3  NEUTROABS 2.9  --  8.9*  --   --  5.7  --   HGB 11.7*   < > 11.8*   < > 11.2* 10.2* 9.6*  HCT 34.0*   < > 34.7*   < > 33.2* 29.9* 28.2*  MCV 84.8  --  86.5   < > 86.0 85.9 86.2  PLT 196  --  194   < > 212 202 186   < > = values in this interval not displayed.   Lab Results  Component Value Date   TSH 1.685 01/25/2017   Lab Results  Component Value Date   HGBA1C 4.4 07/31/2017   Lab Results  Component Value Date   CHOL 135 01/24/2017   HDL 37 (L) 01/24/2017   LDLCALC 74  01/24/2017   TRIG 119 01/24/2017   CHOLHDL 3.6 01/24/2017    Significant Diagnostic Results in last 30 days:  Dg Chest 2 View  Result Date: 01/11/2018 CLINICAL DATA:  Chest pain and productive cough for 2 weeks. Dyspnea. EXAM: CHEST  2 VIEW COMPARISON:  12/23/2017 FINDINGS: The heart size and mediastinal contours are within normal limits. Aortic atherosclerosis. New diffuse interstitial infiltrates and Kerley B-lines are consistent with diffuse interstitial  edema. No evidence of pulmonary consolidation or pleural effusion. IMPRESSION: New diffuse pulmonary interstitial edema. No focal consolidation or pleural effusion. Electronically Signed   By: Earle Gell M.D.   On: 01/11/2018 12:55    Assessment/Plan ***   Family/ staff Communication: ***  Labs/tests ordered:  ***  Goals of care:   ***   Durenda Age, NP [facility] and Adult Medicine [phone] (Monday-Friday 8:00 a.m. - 5:00 p.m.) [phone] (after hours)  This encounter was created in error - please disregard.

## 2018-02-09 ENCOUNTER — Encounter: Payer: Self-pay | Admitting: Internal Medicine

## 2018-02-09 ENCOUNTER — Non-Acute Institutional Stay: Payer: Self-pay | Admitting: Internal Medicine

## 2018-02-09 DIAGNOSIS — N186 End stage renal disease: Secondary | ICD-10-CM | POA: Diagnosis not present

## 2018-02-09 DIAGNOSIS — Z992 Dependence on renal dialysis: Secondary | ICD-10-CM

## 2018-02-09 DIAGNOSIS — F17200 Nicotine dependence, unspecified, uncomplicated: Secondary | ICD-10-CM | POA: Diagnosis not present

## 2018-02-09 DIAGNOSIS — I10 Essential (primary) hypertension: Secondary | ICD-10-CM

## 2018-02-09 DIAGNOSIS — I5042 Chronic combined systolic (congestive) and diastolic (congestive) heart failure: Secondary | ICD-10-CM | POA: Diagnosis not present

## 2018-02-09 NOTE — Progress Notes (Signed)
This encounter was created in error - please disregard.

## 2018-02-09 NOTE — Assessment & Plan Note (Signed)
BP controlled; no change in antihypertensive medications  

## 2018-02-09 NOTE — Assessment & Plan Note (Signed)
He states he is avoiding excess salt intake Clinically 02/09/18 there is no sign of decompensated congestive heart failure

## 2018-02-09 NOTE — Progress Notes (Signed)
NURSING HOME LOCATION:  [facility] ROOM NUMBER:  [id number]  CODE STATUS:  Full Code  PCP:  [facility], [name]  [address] [place] [address]  The patient is being discharged from [facility] on this date by Unice Cobble MD.  The medical history in this facility was reviewed and summarized and medical problem list was updated. Time spent and note content is documented as follows.  Summary of [place] medical records: The patient has been a permanent resident of [facility]. He is transferring to another facility.  Most recent hospitalization was 2/11-2/14/19 for acute pulmonary edema with acute on chronic heart failure exacerbation in context of recent respiratory tract illness for which he received antibiotics. Significantly the patient was hospitalized prior to dialysis on Monday 2/11. Over the weekend prior to hospitalization he had ingested increased amounts of dietary sodium. He was also eating salted snacks. The chest x-ray revealed interstitial pulmonary edema with a BNP over 4000. Ejection fraction on echocardiogram was 45-50 percent with diffuse hypokinesis and grade 1 diastolic dysfunction. Hemodialysis was performed in the hospital with significant rapid improvement in his respiratory symptoms. He has end-stage renal disease and receives hemodialysis every Monday, Wednesday, and Friday. Dry weights were monitored at hemodialysis. The discharge creatinine on 2/14 was 8.32 with BUN of 50 and GFR of 7. He also exhibited a normochromic, normocytic anemia with hemoglobin 9.6 and hematocrit 28.2. Labs are performed at hemodialysis.  Other significant history includes history of stroke, peripheral arterial disease, nonischemic cardiomyopathy, hypertensive cardiac disease, nonocclusive coronary artery disease, history of A. Fib, COPD, history of cocaine abuse, protein/caloric malnutrition and carotid stenosis. He has  significant visual impairment. He continues to smoke in the facility courtyard. He states he only smokes 3-4 cigarettes a day.  Review of systems:Pertinent or active symptoms include: He states that he will wake up most nights between 2 and 4 AM. He does not describe actual paroxysmal nocturnal dyspnea. He states he will typically drink coffee then go back to sleep. He denies other active cardiopulmonary symptoms. He has noted some black stool in the context of iron supplementation. He sustained a wound to the left great toe when his nails were clipped at the [place] last week. He describes some anxiety   Negative QIO:[id number]: No fever,significant weight change, fatigue  Eyes: No redness, discharge, pain, vision change ENT/mouth: No nasal congestion, purulent discharge, earache, change in hearing, sore throat  Cardiovascular: No chest pain, palpitations, claudication, edema  Respiratory: No cough, sputum production, hemoptysis, DOE , significant snoring, apnea   Gastrointestinal: No heartburn, dysphagia, abdominal pain, nausea /vomiting,rectal bleeding, change in bowels Genitourinary: No dysuria, hematuria, pyuria, incontinence, nocturia Musculoskeletal: No joint stiffness, joint swelling, weakness,pain Dermatologic: No rash, pruritus Neurologic: No dizziness, headache, syncope, seizures, numbness, tingling Psychiatric: No significant depression, insomnia, anorexia Endocrine: No change in hair/skin/ nails, excessive thirst, excessive hunger, excessive urination  Hematologic/lymphatic: No significant bruising, lymphadenopathy, abnormal bleeding Allergy/immunology: No itchy/watery eyes, significant sneezing, urticaria, angioedema  Physical exam:  Pertinent or positive findings: Appears chronically ill.Arcus senilis present. There is minimal exotropia on the left. There is very slight nasolabial asymmetry with the right being decreased. He is wearing only the upper plate. Breath sounds are  decreased. He had minor rales at the right lower lobe. He has a raspy grade 1 systolic murmur at the base. There is radiation of the murmur or bilateral carotid bruits. There is dullness to percussion the right upper quadrant without definite hepatomegaly. There  is no hepatojugular reflux or neck vein distention at 10. He has decreased pedal pulses. The skin over the feet is dry and cracked. Nails are deformed. Eschar is noted at the left toe distally. He has slight fracture flexion contractures of the fourth and fifth fingers bilaterally. Shunt is present of the left biceps.  General appearance:  no acute distress, increased work of breathing is present.   Lymphatic: No lymphadenopathy about the head, neck, axilla . Eyes: No conjunctival inflammation or lid edema is present. There is no scleral icterus. Ears:  External ear exam shows no significant lesions or deformities.   Nose:  External nasal examination shows no deformity or inflammation. Nasal mucosa are pink and moist without lesions ,exudates Oral exam: Lips and gums are healthy appearing.There is no oropharyngeal erythema or exudate . Neck:  No thyromegaly, masses, tenderness noted.    Heart:  Normal rate and regular rhythm. S1 and S2 normal without gallop, click, rub .  Lungs:without wheezes, rhonchi, rubs. Abdomen: Bowel sounds are normal. Abdomen is soft and nontender with no organomegaly, hernias, masses. GU: deferred  Extremities:  No cyanosis, clubbing, edema  Skin: Warm & dry w/o tenting. No significant rash.  See clinical summary of Discharge Diagnoses in the Problem List with associated updated therapeutic plan  Follow-up will be by the primary care physician within 30 days @ new facility.

## 2018-02-09 NOTE — Assessment & Plan Note (Addendum)
02/09/18 patient states that he smokes 3-4 cigarettes daily Cardiopulmonary risks have been discussed with him He has no interest in smoking cessation despite risks

## 2018-02-09 NOTE — Assessment & Plan Note (Signed)
Continue hemodialysis Monday, Wednesday, and Friday Labs and dry weights monitored at hemodialysis

## 2018-02-09 NOTE — Patient Instructions (Signed)
See assessment and plan under each diagnosis in the problem list and acutely for this visit 

## 2018-02-16 NOTE — Progress Notes (Signed)
This encounter was created in error - please disregard.

## 2018-02-16 NOTE — Addendum Note (Signed)
Addended by: Bonney Leitz T on: 02/16/2018 02:09 PM   Modules accepted: Level of Service, SmartSet

## 2018-02-20 DIAGNOSIS — N186 End stage renal disease: Secondary | ICD-10-CM | POA: Diagnosis not present

## 2018-02-20 DIAGNOSIS — R2689 Other abnormalities of gait and mobility: Secondary | ICD-10-CM | POA: Diagnosis not present

## 2018-02-20 DIAGNOSIS — D631 Anemia in chronic kidney disease: Secondary | ICD-10-CM | POA: Diagnosis not present

## 2018-02-20 DIAGNOSIS — R488 Other symbolic dysfunctions: Secondary | ICD-10-CM | POA: Diagnosis not present

## 2018-02-21 DIAGNOSIS — R488 Other symbolic dysfunctions: Secondary | ICD-10-CM | POA: Diagnosis not present

## 2018-02-21 DIAGNOSIS — R2689 Other abnormalities of gait and mobility: Secondary | ICD-10-CM | POA: Diagnosis not present

## 2018-02-21 DIAGNOSIS — N186 End stage renal disease: Secondary | ICD-10-CM | POA: Diagnosis not present

## 2018-02-21 DIAGNOSIS — D631 Anemia in chronic kidney disease: Secondary | ICD-10-CM | POA: Diagnosis not present

## 2018-02-22 DIAGNOSIS — R488 Other symbolic dysfunctions: Secondary | ICD-10-CM | POA: Diagnosis not present

## 2018-02-22 DIAGNOSIS — R2689 Other abnormalities of gait and mobility: Secondary | ICD-10-CM | POA: Diagnosis not present

## 2018-02-22 DIAGNOSIS — N186 End stage renal disease: Secondary | ICD-10-CM | POA: Diagnosis not present

## 2018-02-22 DIAGNOSIS — D631 Anemia in chronic kidney disease: Secondary | ICD-10-CM | POA: Diagnosis not present

## 2018-02-23 DIAGNOSIS — D631 Anemia in chronic kidney disease: Secondary | ICD-10-CM | POA: Diagnosis not present

## 2018-02-23 DIAGNOSIS — R488 Other symbolic dysfunctions: Secondary | ICD-10-CM | POA: Diagnosis not present

## 2018-02-23 DIAGNOSIS — N186 End stage renal disease: Secondary | ICD-10-CM | POA: Diagnosis not present

## 2018-02-23 DIAGNOSIS — R2689 Other abnormalities of gait and mobility: Secondary | ICD-10-CM | POA: Diagnosis not present

## 2018-02-24 DIAGNOSIS — R488 Other symbolic dysfunctions: Secondary | ICD-10-CM | POA: Diagnosis not present

## 2018-02-24 DIAGNOSIS — R2689 Other abnormalities of gait and mobility: Secondary | ICD-10-CM | POA: Diagnosis not present

## 2018-02-24 DIAGNOSIS — D631 Anemia in chronic kidney disease: Secondary | ICD-10-CM | POA: Diagnosis not present

## 2018-02-24 DIAGNOSIS — N186 End stage renal disease: Secondary | ICD-10-CM | POA: Diagnosis not present

## 2018-02-25 DIAGNOSIS — R2689 Other abnormalities of gait and mobility: Secondary | ICD-10-CM | POA: Diagnosis not present

## 2018-02-25 DIAGNOSIS — I429 Cardiomyopathy, unspecified: Secondary | ICD-10-CM | POA: Diagnosis not present

## 2018-02-25 DIAGNOSIS — R488 Other symbolic dysfunctions: Secondary | ICD-10-CM | POA: Diagnosis not present

## 2018-02-25 DIAGNOSIS — N186 End stage renal disease: Secondary | ICD-10-CM | POA: Diagnosis not present

## 2018-02-25 DIAGNOSIS — I10 Essential (primary) hypertension: Secondary | ICD-10-CM | POA: Diagnosis not present

## 2018-02-25 DIAGNOSIS — D631 Anemia in chronic kidney disease: Secondary | ICD-10-CM | POA: Diagnosis not present

## 2018-02-25 DIAGNOSIS — I639 Cerebral infarction, unspecified: Secondary | ICD-10-CM | POA: Diagnosis not present

## 2018-02-28 DIAGNOSIS — R2689 Other abnormalities of gait and mobility: Secondary | ICD-10-CM | POA: Diagnosis not present

## 2018-02-28 DIAGNOSIS — D631 Anemia in chronic kidney disease: Secondary | ICD-10-CM | POA: Diagnosis not present

## 2018-02-28 DIAGNOSIS — R488 Other symbolic dysfunctions: Secondary | ICD-10-CM | POA: Diagnosis not present

## 2018-02-28 DIAGNOSIS — N186 End stage renal disease: Secondary | ICD-10-CM | POA: Diagnosis not present

## 2018-03-01 ENCOUNTER — Other Ambulatory Visit (HOSPITAL_COMMUNITY): Payer: Self-pay | Admitting: Nephrology

## 2018-03-01 DIAGNOSIS — R488 Other symbolic dysfunctions: Secondary | ICD-10-CM | POA: Diagnosis not present

## 2018-03-01 DIAGNOSIS — Z79899 Other long term (current) drug therapy: Secondary | ICD-10-CM | POA: Diagnosis not present

## 2018-03-01 DIAGNOSIS — I998 Other disorder of circulatory system: Secondary | ICD-10-CM

## 2018-03-01 DIAGNOSIS — E785 Hyperlipidemia, unspecified: Secondary | ICD-10-CM | POA: Diagnosis not present

## 2018-03-01 DIAGNOSIS — R2689 Other abnormalities of gait and mobility: Secondary | ICD-10-CM | POA: Diagnosis not present

## 2018-03-01 DIAGNOSIS — I10 Essential (primary) hypertension: Secondary | ICD-10-CM | POA: Diagnosis not present

## 2018-03-01 DIAGNOSIS — E119 Type 2 diabetes mellitus without complications: Secondary | ICD-10-CM | POA: Diagnosis not present

## 2018-03-01 DIAGNOSIS — D631 Anemia in chronic kidney disease: Secondary | ICD-10-CM | POA: Diagnosis not present

## 2018-03-01 DIAGNOSIS — N186 End stage renal disease: Secondary | ICD-10-CM | POA: Diagnosis not present

## 2018-03-01 DIAGNOSIS — R05 Cough: Secondary | ICD-10-CM | POA: Diagnosis not present

## 2018-03-01 DIAGNOSIS — D649 Anemia, unspecified: Secondary | ICD-10-CM | POA: Diagnosis not present

## 2018-03-02 ENCOUNTER — Ambulatory Visit (HOSPITAL_COMMUNITY)
Admission: RE | Admit: 2018-03-02 | Discharge: 2018-03-02 | Disposition: A | Discharge status: Home / Self Care | Payer: Medicare Other | Source: Ambulatory Visit | Attending: Vascular Surgery | Admitting: Vascular Surgery

## 2018-03-02 DIAGNOSIS — R488 Other symbolic dysfunctions: Secondary | ICD-10-CM | POA: Diagnosis not present

## 2018-03-02 DIAGNOSIS — L608 Other nail disorders: Secondary | ICD-10-CM | POA: Diagnosis not present

## 2018-03-02 DIAGNOSIS — I998 Other disorder of circulatory system: Secondary | ICD-10-CM | POA: Diagnosis not present

## 2018-03-02 DIAGNOSIS — R2689 Other abnormalities of gait and mobility: Secondary | ICD-10-CM | POA: Diagnosis not present

## 2018-03-02 DIAGNOSIS — D631 Anemia in chronic kidney disease: Secondary | ICD-10-CM | POA: Diagnosis not present

## 2018-03-02 DIAGNOSIS — N186 End stage renal disease: Secondary | ICD-10-CM | POA: Diagnosis not present

## 2018-03-03 DIAGNOSIS — R488 Other symbolic dysfunctions: Secondary | ICD-10-CM | POA: Diagnosis not present

## 2018-03-03 DIAGNOSIS — R2689 Other abnormalities of gait and mobility: Secondary | ICD-10-CM | POA: Diagnosis not present

## 2018-03-03 DIAGNOSIS — N186 End stage renal disease: Secondary | ICD-10-CM | POA: Diagnosis not present

## 2018-03-03 DIAGNOSIS — D631 Anemia in chronic kidney disease: Secondary | ICD-10-CM | POA: Diagnosis not present

## 2018-03-04 DIAGNOSIS — R488 Other symbolic dysfunctions: Secondary | ICD-10-CM | POA: Diagnosis not present

## 2018-03-04 DIAGNOSIS — N186 End stage renal disease: Secondary | ICD-10-CM | POA: Diagnosis not present

## 2018-03-04 DIAGNOSIS — R2689 Other abnormalities of gait and mobility: Secondary | ICD-10-CM | POA: Diagnosis not present

## 2018-03-04 DIAGNOSIS — D631 Anemia in chronic kidney disease: Secondary | ICD-10-CM | POA: Diagnosis not present

## 2018-03-05 DIAGNOSIS — R2689 Other abnormalities of gait and mobility: Secondary | ICD-10-CM | POA: Diagnosis not present

## 2018-03-05 DIAGNOSIS — N186 End stage renal disease: Secondary | ICD-10-CM | POA: Diagnosis not present

## 2018-03-05 DIAGNOSIS — D631 Anemia in chronic kidney disease: Secondary | ICD-10-CM | POA: Diagnosis not present

## 2018-03-05 DIAGNOSIS — R488 Other symbolic dysfunctions: Secondary | ICD-10-CM | POA: Diagnosis not present

## 2018-03-07 DIAGNOSIS — N186 End stage renal disease: Secondary | ICD-10-CM | POA: Diagnosis not present

## 2018-03-07 DIAGNOSIS — R2689 Other abnormalities of gait and mobility: Secondary | ICD-10-CM | POA: Diagnosis not present

## 2018-03-07 DIAGNOSIS — R488 Other symbolic dysfunctions: Secondary | ICD-10-CM | POA: Diagnosis not present

## 2018-03-07 DIAGNOSIS — D631 Anemia in chronic kidney disease: Secondary | ICD-10-CM | POA: Diagnosis not present

## 2018-03-08 DIAGNOSIS — N186 End stage renal disease: Secondary | ICD-10-CM | POA: Diagnosis not present

## 2018-03-08 DIAGNOSIS — D631 Anemia in chronic kidney disease: Secondary | ICD-10-CM | POA: Diagnosis not present

## 2018-03-08 DIAGNOSIS — R488 Other symbolic dysfunctions: Secondary | ICD-10-CM | POA: Diagnosis not present

## 2018-03-08 DIAGNOSIS — I429 Cardiomyopathy, unspecified: Secondary | ICD-10-CM | POA: Diagnosis not present

## 2018-03-08 DIAGNOSIS — R2689 Other abnormalities of gait and mobility: Secondary | ICD-10-CM | POA: Diagnosis not present

## 2018-03-08 DIAGNOSIS — I10 Essential (primary) hypertension: Secondary | ICD-10-CM | POA: Diagnosis not present

## 2018-03-09 DIAGNOSIS — D631 Anemia in chronic kidney disease: Secondary | ICD-10-CM | POA: Diagnosis not present

## 2018-03-09 DIAGNOSIS — N186 End stage renal disease: Secondary | ICD-10-CM | POA: Diagnosis not present

## 2018-03-09 DIAGNOSIS — R488 Other symbolic dysfunctions: Secondary | ICD-10-CM | POA: Diagnosis not present

## 2018-03-09 DIAGNOSIS — R2689 Other abnormalities of gait and mobility: Secondary | ICD-10-CM | POA: Diagnosis not present

## 2018-03-10 DIAGNOSIS — R2689 Other abnormalities of gait and mobility: Secondary | ICD-10-CM | POA: Diagnosis not present

## 2018-03-10 DIAGNOSIS — R488 Other symbolic dysfunctions: Secondary | ICD-10-CM | POA: Diagnosis not present

## 2018-03-10 DIAGNOSIS — N186 End stage renal disease: Secondary | ICD-10-CM | POA: Diagnosis not present

## 2018-03-10 DIAGNOSIS — D631 Anemia in chronic kidney disease: Secondary | ICD-10-CM | POA: Diagnosis not present

## 2018-03-11 DIAGNOSIS — N186 End stage renal disease: Secondary | ICD-10-CM | POA: Diagnosis not present

## 2018-03-11 DIAGNOSIS — R2689 Other abnormalities of gait and mobility: Secondary | ICD-10-CM | POA: Diagnosis not present

## 2018-03-11 DIAGNOSIS — R488 Other symbolic dysfunctions: Secondary | ICD-10-CM | POA: Diagnosis not present

## 2018-03-11 DIAGNOSIS — D631 Anemia in chronic kidney disease: Secondary | ICD-10-CM | POA: Diagnosis not present

## 2018-03-12 DIAGNOSIS — R2689 Other abnormalities of gait and mobility: Secondary | ICD-10-CM | POA: Diagnosis not present

## 2018-03-12 DIAGNOSIS — R488 Other symbolic dysfunctions: Secondary | ICD-10-CM | POA: Diagnosis not present

## 2018-03-12 DIAGNOSIS — N186 End stage renal disease: Secondary | ICD-10-CM | POA: Diagnosis not present

## 2018-03-12 DIAGNOSIS — D631 Anemia in chronic kidney disease: Secondary | ICD-10-CM | POA: Diagnosis not present

## 2018-03-15 DIAGNOSIS — I10 Essential (primary) hypertension: Secondary | ICD-10-CM | POA: Diagnosis not present

## 2018-03-15 DIAGNOSIS — N186 End stage renal disease: Secondary | ICD-10-CM | POA: Diagnosis not present

## 2018-03-15 DIAGNOSIS — R2689 Other abnormalities of gait and mobility: Secondary | ICD-10-CM | POA: Diagnosis not present

## 2018-03-15 DIAGNOSIS — D631 Anemia in chronic kidney disease: Secondary | ICD-10-CM | POA: Diagnosis not present

## 2018-03-15 DIAGNOSIS — I429 Cardiomyopathy, unspecified: Secondary | ICD-10-CM | POA: Diagnosis not present

## 2018-03-15 DIAGNOSIS — R488 Other symbolic dysfunctions: Secondary | ICD-10-CM | POA: Diagnosis not present

## 2018-03-16 DIAGNOSIS — N186 End stage renal disease: Secondary | ICD-10-CM | POA: Diagnosis not present

## 2018-03-16 DIAGNOSIS — R488 Other symbolic dysfunctions: Secondary | ICD-10-CM | POA: Diagnosis not present

## 2018-03-16 DIAGNOSIS — D631 Anemia in chronic kidney disease: Secondary | ICD-10-CM | POA: Diagnosis not present

## 2018-03-16 DIAGNOSIS — R2689 Other abnormalities of gait and mobility: Secondary | ICD-10-CM | POA: Diagnosis not present

## 2018-03-17 DIAGNOSIS — R488 Other symbolic dysfunctions: Secondary | ICD-10-CM | POA: Diagnosis not present

## 2018-03-17 DIAGNOSIS — R2689 Other abnormalities of gait and mobility: Secondary | ICD-10-CM | POA: Diagnosis not present

## 2018-03-17 DIAGNOSIS — N186 End stage renal disease: Secondary | ICD-10-CM | POA: Diagnosis not present

## 2018-03-17 DIAGNOSIS — D631 Anemia in chronic kidney disease: Secondary | ICD-10-CM | POA: Diagnosis not present

## 2018-03-18 DIAGNOSIS — R488 Other symbolic dysfunctions: Secondary | ICD-10-CM | POA: Diagnosis not present

## 2018-03-18 DIAGNOSIS — D631 Anemia in chronic kidney disease: Secondary | ICD-10-CM | POA: Diagnosis not present

## 2018-03-18 DIAGNOSIS — N186 End stage renal disease: Secondary | ICD-10-CM | POA: Diagnosis not present

## 2018-03-18 DIAGNOSIS — R2689 Other abnormalities of gait and mobility: Secondary | ICD-10-CM | POA: Diagnosis not present

## 2018-03-19 DIAGNOSIS — D631 Anemia in chronic kidney disease: Secondary | ICD-10-CM | POA: Diagnosis not present

## 2018-03-19 DIAGNOSIS — R2689 Other abnormalities of gait and mobility: Secondary | ICD-10-CM | POA: Diagnosis not present

## 2018-03-19 DIAGNOSIS — R488 Other symbolic dysfunctions: Secondary | ICD-10-CM | POA: Diagnosis not present

## 2018-03-19 DIAGNOSIS — N186 End stage renal disease: Secondary | ICD-10-CM | POA: Diagnosis not present

## 2018-03-20 DIAGNOSIS — N186 End stage renal disease: Secondary | ICD-10-CM | POA: Diagnosis not present

## 2018-03-20 DIAGNOSIS — R2689 Other abnormalities of gait and mobility: Secondary | ICD-10-CM | POA: Diagnosis not present

## 2018-03-20 DIAGNOSIS — D631 Anemia in chronic kidney disease: Secondary | ICD-10-CM | POA: Diagnosis not present

## 2018-03-20 DIAGNOSIS — R488 Other symbolic dysfunctions: Secondary | ICD-10-CM | POA: Diagnosis not present

## 2018-03-21 DIAGNOSIS — D631 Anemia in chronic kidney disease: Secondary | ICD-10-CM | POA: Diagnosis not present

## 2018-03-21 DIAGNOSIS — R488 Other symbolic dysfunctions: Secondary | ICD-10-CM | POA: Diagnosis not present

## 2018-03-21 DIAGNOSIS — N186 End stage renal disease: Secondary | ICD-10-CM | POA: Diagnosis not present

## 2018-03-21 DIAGNOSIS — R2689 Other abnormalities of gait and mobility: Secondary | ICD-10-CM | POA: Diagnosis not present

## 2018-03-22 DIAGNOSIS — I10 Essential (primary) hypertension: Secondary | ICD-10-CM | POA: Diagnosis not present

## 2018-03-22 DIAGNOSIS — R2689 Other abnormalities of gait and mobility: Secondary | ICD-10-CM | POA: Diagnosis not present

## 2018-03-22 DIAGNOSIS — N186 End stage renal disease: Secondary | ICD-10-CM | POA: Diagnosis not present

## 2018-03-22 DIAGNOSIS — I429 Cardiomyopathy, unspecified: Secondary | ICD-10-CM | POA: Diagnosis not present

## 2018-03-22 DIAGNOSIS — R488 Other symbolic dysfunctions: Secondary | ICD-10-CM | POA: Diagnosis not present

## 2018-03-22 DIAGNOSIS — D631 Anemia in chronic kidney disease: Secondary | ICD-10-CM | POA: Diagnosis not present

## 2018-03-23 DIAGNOSIS — R488 Other symbolic dysfunctions: Secondary | ICD-10-CM | POA: Diagnosis not present

## 2018-03-23 DIAGNOSIS — R2689 Other abnormalities of gait and mobility: Secondary | ICD-10-CM | POA: Diagnosis not present

## 2018-03-23 DIAGNOSIS — N186 End stage renal disease: Secondary | ICD-10-CM | POA: Diagnosis not present

## 2018-03-23 DIAGNOSIS — D631 Anemia in chronic kidney disease: Secondary | ICD-10-CM | POA: Diagnosis not present

## 2018-03-24 DIAGNOSIS — R2689 Other abnormalities of gait and mobility: Secondary | ICD-10-CM | POA: Diagnosis not present

## 2018-03-24 DIAGNOSIS — N186 End stage renal disease: Secondary | ICD-10-CM | POA: Diagnosis not present

## 2018-03-24 DIAGNOSIS — D631 Anemia in chronic kidney disease: Secondary | ICD-10-CM | POA: Diagnosis not present

## 2018-03-24 DIAGNOSIS — R488 Other symbolic dysfunctions: Secondary | ICD-10-CM | POA: Diagnosis not present

## 2018-03-28 DIAGNOSIS — R2689 Other abnormalities of gait and mobility: Secondary | ICD-10-CM | POA: Diagnosis not present

## 2018-03-28 DIAGNOSIS — N186 End stage renal disease: Secondary | ICD-10-CM | POA: Diagnosis not present

## 2018-03-28 DIAGNOSIS — D631 Anemia in chronic kidney disease: Secondary | ICD-10-CM | POA: Diagnosis not present

## 2018-03-28 DIAGNOSIS — R488 Other symbolic dysfunctions: Secondary | ICD-10-CM | POA: Diagnosis not present

## 2018-03-29 ENCOUNTER — Inpatient Hospital Stay (HOSPITAL_COMMUNITY)
Admission: EM | Admit: 2018-03-29 | Discharge: 2018-03-30 | DRG: 291 | Disposition: A | Discharge status: Skilled Nursing Facility | Payer: Medicare Other | Attending: Internal Medicine | Admitting: Internal Medicine

## 2018-03-29 ENCOUNTER — Encounter (HOSPITAL_COMMUNITY): Payer: Self-pay | Admitting: Emergency Medicine

## 2018-03-29 ENCOUNTER — Emergency Department (HOSPITAL_COMMUNITY): Payer: Medicare Other

## 2018-03-29 ENCOUNTER — Other Ambulatory Visit: Payer: Self-pay

## 2018-03-29 DIAGNOSIS — H5461 Unqualified visual loss, right eye, normal vision left eye: Secondary | ICD-10-CM | POA: Diagnosis present

## 2018-03-29 DIAGNOSIS — E8770 Fluid overload, unspecified: Secondary | ICD-10-CM | POA: Diagnosis not present

## 2018-03-29 DIAGNOSIS — D631 Anemia in chronic kidney disease: Secondary | ICD-10-CM | POA: Diagnosis present

## 2018-03-29 DIAGNOSIS — I2510 Atherosclerotic heart disease of native coronary artery without angina pectoris: Secondary | ICD-10-CM | POA: Diagnosis present

## 2018-03-29 DIAGNOSIS — I10 Essential (primary) hypertension: Secondary | ICD-10-CM | POA: Diagnosis not present

## 2018-03-29 DIAGNOSIS — F419 Anxiety disorder, unspecified: Secondary | ICD-10-CM | POA: Diagnosis present

## 2018-03-29 DIAGNOSIS — E875 Hyperkalemia: Secondary | ICD-10-CM | POA: Diagnosis present

## 2018-03-29 DIAGNOSIS — R531 Weakness: Secondary | ICD-10-CM | POA: Diagnosis not present

## 2018-03-29 DIAGNOSIS — Z992 Dependence on renal dialysis: Secondary | ICD-10-CM

## 2018-03-29 DIAGNOSIS — N189 Chronic kidney disease, unspecified: Secondary | ICD-10-CM | POA: Diagnosis present

## 2018-03-29 DIAGNOSIS — F17210 Nicotine dependence, cigarettes, uncomplicated: Secondary | ICD-10-CM | POA: Diagnosis present

## 2018-03-29 DIAGNOSIS — I5042 Chronic combined systolic (congestive) and diastolic (congestive) heart failure: Secondary | ICD-10-CM | POA: Diagnosis present

## 2018-03-29 DIAGNOSIS — E8889 Other specified metabolic disorders: Secondary | ICD-10-CM | POA: Diagnosis present

## 2018-03-29 DIAGNOSIS — J9601 Acute respiratory failure with hypoxia: Secondary | ICD-10-CM | POA: Diagnosis present

## 2018-03-29 DIAGNOSIS — Z7902 Long term (current) use of antithrombotics/antiplatelets: Secondary | ICD-10-CM

## 2018-03-29 DIAGNOSIS — J810 Acute pulmonary edema: Secondary | ICD-10-CM | POA: Diagnosis not present

## 2018-03-29 DIAGNOSIS — I429 Cardiomyopathy, unspecified: Secondary | ICD-10-CM | POA: Diagnosis present

## 2018-03-29 DIAGNOSIS — N2581 Secondary hyperparathyroidism of renal origin: Secondary | ICD-10-CM | POA: Diagnosis present

## 2018-03-29 DIAGNOSIS — Z8673 Personal history of transient ischemic attack (TIA), and cerebral infarction without residual deficits: Secondary | ICD-10-CM

## 2018-03-29 DIAGNOSIS — Z79899 Other long term (current) drug therapy: Secondary | ICD-10-CM

## 2018-03-29 DIAGNOSIS — N186 End stage renal disease: Secondary | ICD-10-CM | POA: Diagnosis present

## 2018-03-29 DIAGNOSIS — R05 Cough: Secondary | ICD-10-CM | POA: Diagnosis not present

## 2018-03-29 DIAGNOSIS — D649 Anemia, unspecified: Secondary | ICD-10-CM | POA: Diagnosis not present

## 2018-03-29 DIAGNOSIS — I120 Hypertensive chronic kidney disease with stage 5 chronic kidney disease or end stage renal disease: Secondary | ICD-10-CM | POA: Diagnosis not present

## 2018-03-29 DIAGNOSIS — G464 Cerebellar stroke syndrome: Secondary | ICD-10-CM | POA: Diagnosis not present

## 2018-03-29 DIAGNOSIS — R079 Chest pain, unspecified: Secondary | ICD-10-CM | POA: Diagnosis not present

## 2018-03-29 DIAGNOSIS — R404 Transient alteration of awareness: Secondary | ICD-10-CM | POA: Diagnosis not present

## 2018-03-29 DIAGNOSIS — R195 Other fecal abnormalities: Secondary | ICD-10-CM | POA: Diagnosis present

## 2018-03-29 DIAGNOSIS — R0602 Shortness of breath: Secondary | ICD-10-CM | POA: Diagnosis not present

## 2018-03-29 DIAGNOSIS — I132 Hypertensive heart and chronic kidney disease with heart failure and with stage 5 chronic kidney disease, or end stage renal disease: Principal | ICD-10-CM | POA: Diagnosis present

## 2018-03-29 LAB — I-STAT TROPONIN, ED: Troponin i, poc: 0.04 ng/mL (ref 0.00–0.08)

## 2018-03-29 LAB — CBC WITH DIFFERENTIAL/PLATELET
BASOS ABS: 0.1 10*3/uL (ref 0.0–0.1)
BASOS PCT: 1 %
EOS ABS: 0.4 10*3/uL (ref 0.0–0.7)
EOS PCT: 4 %
HCT: 35.8 % — ABNORMAL LOW (ref 39.0–52.0)
Hemoglobin: 11.7 g/dL — ABNORMAL LOW (ref 13.0–17.0)
Lymphocytes Relative: 21 %
Lymphs Abs: 2.4 10*3/uL (ref 0.7–4.0)
MCH: 29 pg (ref 26.0–34.0)
MCHC: 32.7 g/dL (ref 30.0–36.0)
MCV: 88.8 fL (ref 78.0–100.0)
MONO ABS: 0.6 10*3/uL (ref 0.1–1.0)
Monocytes Relative: 5 %
Neutro Abs: 7.9 10*3/uL — ABNORMAL HIGH (ref 1.7–7.7)
Neutrophils Relative %: 69 %
PLATELETS: 201 10*3/uL (ref 150–400)
RBC: 4.03 MIL/uL — AB (ref 4.22–5.81)
RDW: 15 % (ref 11.5–15.5)
WBC: 11.4 10*3/uL — AB (ref 4.0–10.5)

## 2018-03-29 LAB — BASIC METABOLIC PANEL
ANION GAP: 16 — AB (ref 5–15)
BUN: 50 mg/dL — AB (ref 6–20)
CHLORIDE: 95 mmol/L — AB (ref 101–111)
CO2: 22 mmol/L (ref 22–32)
Calcium: 9.3 mg/dL (ref 8.9–10.3)
Creatinine, Ser: 9.29 mg/dL — ABNORMAL HIGH (ref 0.61–1.24)
GFR calc Af Amer: 6 mL/min — ABNORMAL LOW (ref >60)
GFR calc non Af Amer: 5 mL/min — ABNORMAL LOW (ref >60)
Glucose, Bld: 218 mg/dL — ABNORMAL HIGH (ref 65–99)
Potassium: 5.8 mmol/L — ABNORMAL HIGH (ref 3.5–5.1)
Sodium: 133 mmol/L — ABNORMAL LOW (ref 135–145)

## 2018-03-29 LAB — HEMOGLOBIN: Hemoglobin: 10.5 g/dL — ABNORMAL LOW (ref 13.0–17.0)

## 2018-03-29 LAB — BRAIN NATRIURETIC PEPTIDE: B Natriuretic Peptide: >4500 pg/mL — ABNORMAL HIGH (ref 0.0–100.0)

## 2018-03-29 LAB — POTASSIUM: POTASSIUM: 4 mmol/L (ref 3.5–5.1)

## 2018-03-29 LAB — I-STAT CG4 LACTIC ACID, ED: LACTIC ACID, VENOUS: 5.37 mmol/L — AB (ref 0.5–1.9)

## 2018-03-29 LAB — CBG MONITORING, ED: Glucose-Capillary: 192 mg/dL — ABNORMAL HIGH (ref 65–99)

## 2018-03-29 LAB — HEMATOCRIT: HCT: 31.2 % — ABNORMAL LOW (ref 39.0–52.0)

## 2018-03-29 LAB — TROPONIN I: Troponin I: 0.05 ng/mL (ref <0.03)

## 2018-03-29 LAB — POC OCCULT BLOOD, ED: FECAL OCCULT BLD: POSITIVE — AB

## 2018-03-29 MED ORDER — AMLODIPINE BESYLATE 5 MG PO TABS: 10.0000 mg | ORAL_TABLET | Freq: Every day | ORAL | Status: DC

## 2018-03-29 MED ORDER — HYDRALAZINE HCL 25 MG PO TABS
25.0000 mg | ORAL_TABLET | Freq: Three times a day (TID) | ORAL | Status: DC
  Administered 2018-03-29 – 2018-03-30 (×4): 25 mg via ORAL
  Filled 2018-03-29 (×4): qty 1

## 2018-03-29 MED ORDER — ACETAMINOPHEN 650 MG RE SUPP: 650.0000 mg | Freq: Four times a day (QID) | RECTAL | Status: DC | PRN

## 2018-03-29 MED ORDER — RENA-VITE PO TABS
1.0000 | ORAL_TABLET | Freq: Every day | ORAL | Status: DC
  Administered 2018-03-29: 1 via ORAL
  Filled 2018-03-29: qty 1

## 2018-03-29 MED ORDER — SENNA 8.6 MG PO TABS
1.0000 | ORAL_TABLET | Freq: Every day | ORAL | Status: DC
  Administered 2018-03-29: 8.6 mg via ORAL
  Filled 2018-03-29 (×2): qty 1

## 2018-03-29 MED ORDER — METOPROLOL TARTRATE 25 MG PO TABS
25.0000 mg | ORAL_TABLET | Freq: Two times a day (BID) | ORAL | Status: DC
  Administered 2018-03-29 – 2018-03-30 (×3): 25 mg via ORAL
  Filled 2018-03-29 (×3): qty 1

## 2018-03-29 MED ORDER — ONDANSETRON HCL 4 MG PO TABS: 4.0000 mg | ORAL_TABLET | Freq: Four times a day (QID) | ORAL | Status: DC | PRN

## 2018-03-29 MED ORDER — SODIUM CHLORIDE 0.9 % IV SOLN: 250.0000 mL | INTRAVENOUS | Status: DC | PRN

## 2018-03-29 MED ORDER — PRO-STAT SUGAR FREE PO LIQD
30.0000 mL | Freq: Two times a day (BID) | ORAL | Status: DC
  Administered 2018-03-29 – 2018-03-30 (×3): 30 mL via ORAL
  Filled 2018-03-29 (×5): qty 30

## 2018-03-29 MED ORDER — HYDRALAZINE HCL 20 MG/ML IJ SOLN: 10.0000 mg | INTRAMUSCULAR | Status: DC | PRN

## 2018-03-29 MED ORDER — NEPRO/CARBSTEADY PO LIQD
237.0000 mL | Freq: Two times a day (BID) | ORAL | Status: DC
  Administered 2018-03-29 – 2018-03-30 (×3): 237 mL via ORAL
  Filled 2018-03-29: qty 237

## 2018-03-29 MED ORDER — ONDANSETRON HCL 4 MG/2ML IJ SOLN: 4.0000 mg | Freq: Four times a day (QID) | INTRAMUSCULAR | Status: DC | PRN

## 2018-03-29 MED ORDER — DOXERCALCIFEROL 4 MCG/2ML IV SOLN
3.0000 ug | INTRAVENOUS | Status: DC
  Filled 2018-03-29: qty 2

## 2018-03-29 MED ORDER — ACETAMINOPHEN 325 MG PO TABS: 650.0000 mg | ORAL_TABLET | Freq: Four times a day (QID) | ORAL | Status: DC | PRN

## 2018-03-29 MED ORDER — CLOPIDOGREL BISULFATE 75 MG PO TABS: 75.0000 mg | ORAL_TABLET | Freq: Every day | ORAL | Status: DC

## 2018-03-29 MED ORDER — FERRIC CITRATE 1 GM 210 MG(FE) PO TABS
630.0000 mg | ORAL_TABLET | Freq: Three times a day (TID) | ORAL | Status: DC
  Administered 2018-03-29 – 2018-03-30 (×5): 630 mg via ORAL
  Filled 2018-03-29 (×6): qty 3

## 2018-03-29 MED ORDER — HYDROCODONE-ACETAMINOPHEN 5-325 MG PO TABS: 1.0000 | ORAL_TABLET | ORAL | Status: DC | PRN

## 2018-03-29 MED ORDER — ISOSORBIDE MONONITRATE ER 30 MG PO TB24
30.0000 mg | ORAL_TABLET | Freq: Every day | ORAL | Status: DC
  Administered 2018-03-29 – 2018-03-30 (×2): 30 mg via ORAL
  Filled 2018-03-29 (×2): qty 1

## 2018-03-29 MED ORDER — GABAPENTIN 100 MG PO CAPS
200.0000 mg | ORAL_CAPSULE | Freq: Every day | ORAL | Status: DC
  Administered 2018-03-29: 200 mg via ORAL
  Filled 2018-03-29: qty 2

## 2018-03-29 MED ORDER — ASPIRIN 81 MG PO CHEW: 81.0000 mg | CHEWABLE_TABLET | Freq: Every day | ORAL | Status: DC

## 2018-03-29 MED ORDER — SODIUM CHLORIDE 0.9% FLUSH: 3.0000 mL | INTRAVENOUS | Status: DC | PRN

## 2018-03-29 MED ORDER — DOCUSATE SODIUM 100 MG PO CAPS
200.0000 mg | ORAL_CAPSULE | Freq: Every day | ORAL | Status: DC
  Administered 2018-03-29: 200 mg via ORAL
  Filled 2018-03-29 (×2): qty 2

## 2018-03-29 MED ORDER — CINACALCET HCL 30 MG PO TABS
90.0000 mg | ORAL_TABLET | Freq: Every day | ORAL | Status: DC
  Administered 2018-03-29: 90 mg via ORAL
  Filled 2018-03-29: qty 3

## 2018-03-29 MED ORDER — GABAPENTIN 300 MG PO CAPS: 300.0000 mg | ORAL_CAPSULE | Freq: Every day | ORAL | Status: DC

## 2018-03-29 MED ORDER — SODIUM CHLORIDE 0.9% FLUSH: 3.0000 mL | Freq: Two times a day (BID) | INTRAVENOUS | Status: DC

## 2018-03-29 MED ORDER — SODIUM CHLORIDE 0.9% FLUSH
3.0000 mL | Freq: Two times a day (BID) | INTRAVENOUS | Status: DC
  Administered 2018-03-29 – 2018-03-30 (×2): 3 mL via INTRAVENOUS

## 2018-03-29 MED ORDER — OMEGA-3-ACID ETHYL ESTERS 1 G PO CAPS
1.0000 g | ORAL_CAPSULE | Freq: Two times a day (BID) | ORAL | Status: DC
  Administered 2018-03-29 – 2018-03-30 (×3): 1 g via ORAL
  Filled 2018-03-29 (×4): qty 1

## 2018-03-29 MED ORDER — ATORVASTATIN CALCIUM 40 MG PO TABS
40.0000 mg | ORAL_TABLET | Freq: Every day | ORAL | Status: DC
  Administered 2018-03-29: 40 mg via ORAL
  Filled 2018-03-29: qty 1

## 2018-03-29 NOTE — Progress Notes (Signed)
[facility] Brief Note  Notified by EDP of this MWF [place] dialysis patient who presented with SOB needing BIPAP. Due for HD later today. Transported from [facility] facility "felt like BP high" BIPAP on arrival. K 5.8. CXR diffuse interstitial pattern to the lungs consistent with interstitial edema. Asked to provide HD. Pt cuts most TMT's short by 15+ minutes. Has not been getting to EDW of 58.  Usual HD MWF [place] 4 hours 2K2Ca  AVF  EDW 58 kg No heparin AVF  Plan for acute HD RN aware If pt kept as inpt full note to follow later today.  Jamal Maes, MD [facility] [phone] Pager 03/29/2018, 3:07 AM

## 2018-03-29 NOTE — Consult Note (Addendum)
[facility] Renal Consultation Note    Indication for Consultation:  Management of ESRD/hemodialysis; anemia, hypertension/volume and secondary hyperparathyroidism   HPI: Trevor [name] is a 72 y.o. male with ESRD on HD (MWF [place]), HTN,NICM, CHF, hx CVA, Hep C, right eye blindness.  Recent [facility] admit in 01/2018 with pulmonary edema, Echo done that admit EF 45-50%, diffuse hypokinesis.   He is admitted with acute pulmonary edema.  Presented to ED from [facility] with SOB, elevated BP. CXR with diffuse interstitial pulmonary edema. Respiratory distress requiring BiPAP on admission.   Mild hyperkalemia K 5.8. Underwent urgent dialysis this morning.   Seen in the HD unit. Breathing improved with dialysis, off BiPAP. O2 sats 99% on [place]. Denies fever, CP, abd pain, N,V,D.   Last outpatient HD was Friday and left 2.6kg over his dry weight. He usually shortens his outpatient treatments by about 5mins and has not been reaching his target weight.   Past Medical History:  Diagnosis Date  . AAA (abdominal aortic aneurysm) ([place])   . Anxiety   . Arthritis   . Blindness   . CHF (congestive heart failure) ([place])   . 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 ESRD on hemodialysis ([place])    started HD 2014-15  . Headache(784.0)   . HTN (hypertension)   . Protein-calorie malnutrition ([place])    Past Surgical History:  Procedure Laterality Date  . AXILLARY-FEMORAL BYPASS GRAFT  06/28/2017   Procedure: BYPASS GRAFT RIGHT AXILLA-BIFEMORAL USING 8MM X 30CM AND 8MM X 60CM HEMASHIELD GOLD GRAFTS;  Surgeon: Rosetta Posner, MD;  Location: [facility];  Service: Vascular;;  . CARDIAC CATHETERIZATION N/A 11/01/2016   Procedure: Left Heart Cath and Coronary Angiography;  Surgeon: Lorretta Harp, MD;  Location: [facility] CV LAB;  Service: Cardiovascular;  Laterality: N/A;  . ENDARTERECTOMY FEMORAL Bilateral 06/28/2017   Procedure:  ENDARTERECTOMY BILATERAL FEMORAL ARTERIES;  Surgeon: Rosetta Posner, MD;  Location: [facility];  Service: Vascular;  Laterality: Bilateral;  . INSERTION OF DIALYSIS CATHETER  09/21/2012   Procedure: INSERTION OF DIALYSIS CATHETER;  Surgeon: Angelia Mould, MD;  Location: [facility];  Service: Vascular;  Laterality: N/A;  Right Internal Jugular Placement  . LEFT HEART CATHETERIZATION WITH CORONARY ANGIOGRAM N/A 09/28/2012   Procedure: LEFT HEART CATHETERIZATION WITH CORONARY ANGIOGRAM;  Surgeon: Sherren Mocha, MD;  Location: [facility] CATH LAB;  Service: Cardiovascular;  Laterality: N/A;  . LEFT HEART CATHETERIZATION WITH CORONARY ANGIOGRAM N/A 07/07/2014   Procedure: LEFT HEART CATHETERIZATION WITH CORONARY ANGIOGRAM;  Surgeon: Leonie Man, MD;  Location: [facility] CATH LAB;  Service: Cardiovascular;  Laterality: N/A;  . right hand    . TEE WITHOUT CARDIOVERSION  10/09/2011   Procedure: TRANSESOPHAGEAL ECHOCARDIOGRAM (TEE);  Surgeon: Lelon Perla, MD;  Location: [facility] ENDOSCOPY;  Service: Cardiovascular;  Laterality: N/A;   Family History  Problem Relation Age of Onset  . Heart attack Mother        MI in her 67s  . Diabetes Mother   . Alcohol abuse Father   . Anesthesia problems Neg Hx   . Hypotension Neg Hx   . Malignant hyperthermia Neg Hx   . Pseudochol deficiency Neg Hx    Social History:  reports that he has been smoking cigarettes.  He has a 30.00 pack-year smoking history. He has never used smokeless tobacco. He reports that he does not drink alcohol or use  drugs. No Known Allergies Prior to Admission medications   Medication Sig Start Date End Date Taking? Authorizing Provider  aspirin 81 MG chewable tablet Chew 1 tablet (81 mg total) by mouth daily. 11/04/16  Yes Simmons, Brittainy M, PA-C  ENSURE PLUS (ENSURE PLUS) LIQD Take 237 mLs by mouth 3 (three) times daily between meals.   Yes [provider]  ferric citrate (AURYXIA) 1 GM 210 MG(Fe) tablet Take 630 mg by mouth 3 (three) times  daily with meals.    Yes [provider]  fluticasone (FLONASE) 50 MCG/ACT nasal spray Place 1 spray into both nostrils 2 (two) times daily.    Yes [provider]  gabapentin (NEURONTIN) 300 MG capsule Take 1 capsule (300 mg total) by mouth at bedtime. 01/14/18  Yes Colbert Ewing, MD  guaiFENesin (ROBITUSSIN) 100 MG/5ML SOLN Take 10 mLs by mouth every 4 (four) hours as needed for cough or to loosen phlegm.   Yes [provider]  hydrALAZINE (APRESOLINE) 25 MG tablet Take 25 mg by mouth 3 (three) times daily.    Yes [provider]  metoprolol tartrate (LOPRESSOR) 25 MG tablet Take 1 tablet (25 mg total) by mouth 2 (two) times daily. 11/03/16  Yes Lyda Jester M, PA-C  Nutritional Supplements (FEEDING SUPPLEMENT, NEPRO CARB STEADY,) LIQD Take 237 mLs by mouth 2 (two) times daily between meals. 12/11/16  Yes Eulogio Bear U, DO  omega-3 acid ethyl esters (LOVAZA) 1 g capsule Take 1 g by mouth 2 (two) times daily.   Yes [provider]  phenol (CHLORASEPTIC) 1.4 % LIQD Use as directed 2 sprays in the mouth or throat every 8 (eight) hours as needed for throat irritation / pain.   Yes [provider]  senna (SENOKOT) 8.6 MG TABS tablet Take 1 tablet by mouth daily.   Yes [provider]  Amino Acids-Protein Hydrolys (FEEDING SUPPLEMENT, PRO-STAT SUGAR FREE 64,) LIQD Take 30 mLs by mouth 2 (two) times daily.    [provider]  amLODipine (NORVASC) 10 MG tablet Take 10 mg by mouth at bedtime.  04/13/17   [provider]  atorvastatin (LIPITOR) 40 MG tablet Take 40 mg by mouth at bedtime.    [provider]  bisacodyl (DULCOLAX) 10 MG suppository Place 10 mg rectally daily as needed (constipation not relieved by MOM).    [provider]  clopidogrel (PLAVIX) 75 MG tablet Take 1 tablet (75 mg total) by mouth daily. Patient not taking: Reported on 03/29/2018 04/05/15   Barton Dubois, MD  docusate sodium  (COLACE) 100 MG capsule Take 200 mg by mouth at bedtime.     [provider]  doxercalciferol (HECTOROL) 4 MCG/2ML injection Inject 1 mL (2 mcg total) into the vein every Monday, Wednesday, and Friday with hemodialysis. Patient not taking: Reported on 03/29/2018 01/15/18   Colbert Ewing, MD  isosorbide mononitrate (IMDUR) 30 MG 24 hr tablet Take 30 mg by mouth daily.    [provider]  multivitamin (RENA-VIT) TABS tablet Take 1 tablet by mouth at bedtime. Patient not taking: Reported on 03/29/2018 07/02/17   Velvet Bathe, MD  Omega-3 Fatty Acids (FISH OIL) 1000 MG CAPS Take 1,000 mg by mouth 2 (two) times daily.    [provider]   Current Facility-Administered Medications  Medication Dose Route Frequency Provider Last Rate Last Dose  . 0.9 %  sodium chloride infusion  250 mL Intravenous PRN Opyd, Ilene Qua, MD      . acetaminophen (TYLENOL) tablet  650 mg  650 mg Oral Q6H PRN Opyd, Ilene Qua, MD       Or  . acetaminophen (TYLENOL) suppository 650 mg  650 mg Rectal Q6H PRN Opyd, Ilene Qua, MD      . amLODipine (NORVASC) tablet 10 mg  10 mg Oral QHS Opyd, Ilene Qua, MD      . atorvastatin (LIPITOR) tablet 40 mg  40 mg Oral q1800 Opyd, Ilene Qua, MD      . docusate sodium (COLACE) capsule 200 mg  200 mg Oral QHS Opyd, Ilene Qua, MD      . feeding supplement (NEPRO CARB STEADY) liquid 237 mL  237 mL Oral BID BM Opyd, Ilene Qua, MD      . feeding supplement (PRO-STAT SUGAR FREE 64) liquid 30 mL  30 mL Oral BID BM Opyd, Ilene Qua, MD      . ferric citrate (AURYXIA) tablet 630 mg  630 mg Oral TID WC Opyd, Ilene Qua, MD      . gabapentin (NEURONTIN) capsule 300 mg  300 mg Oral QHS Opyd, Ilene Qua, MD      . hydrALAZINE (APRESOLINE) injection 10 mg  10 mg Intravenous Q4H PRN Opyd, Ilene Qua, MD      . hydrALAZINE (APRESOLINE) tablet 25 mg  25 mg Oral TID Opyd, Ilene Qua, MD      . HYDROcodone-acetaminophen (NORCO/VICODIN) 5-325 MG per tablet 1-2 tablet  1-2 tablet Oral Q4H  PRN Opyd, Ilene Qua, MD      . isosorbide mononitrate (IMDUR) 24 hr tablet 30 mg  30 mg Oral Daily Opyd, Ilene Qua, MD      . metoprolol tartrate (LOPRESSOR) tablet 25 mg  25 mg Oral BID Opyd, Ilene Qua, MD      . multivitamin (RENA-VIT) tablet 1 tablet  1 tablet Oral QHS Opyd, Ilene Qua, MD      . omega-3 acid ethyl esters (LOVAZA) capsule 1 g  1 g Oral BID Opyd, Ilene Qua, MD      . ondansetron (ZOFRAN) tablet 4 mg  4 mg Oral Q6H PRN Opyd, Ilene Qua, MD       Or  . ondansetron (ZOFRAN) injection 4 mg  4 mg Intravenous Q6H PRN Opyd, Ilene Qua, MD      . sodium chloride flush (NS) 0.9 % injection 3 mL  3 mL Intravenous Q12H Opyd, Ilene Qua, MD      . sodium chloride flush (NS) 0.9 % injection 3 mL  3 mL Intravenous Q12H Opyd, Timothy S, MD      . sodium chloride flush (NS) 0.9 % injection 3 mL  3 mL Intravenous PRN Opyd, Ilene Qua, MD       Current Outpatient Medications  Medication Sig Dispense Refill  . aspirin 81 MG chewable tablet Chew 1 tablet (81 mg total) by mouth daily.    Marland Kitchen ENSURE PLUS (ENSURE PLUS) LIQD Take 237 mLs by mouth 3 (three) times daily between meals.    . ferric citrate (AURYXIA) 1 GM 210 MG(Fe) tablet Take 630 mg by mouth 3 (three) times daily with meals.     . fluticasone (FLONASE) 50 MCG/ACT nasal spray Place 1 spray into both nostrils 2 (two) times daily.     Marland Kitchen gabapentin (NEURONTIN) 300 MG capsule Take 1 capsule (300 mg total) by mouth at bedtime. 30 capsule 0  . guaiFENesin (ROBITUSSIN) 100 MG/5ML SOLN Take 10 mLs by mouth every 4 (four) hours as needed for cough or to loosen phlegm.    Marland Kitchen  hydrALAZINE (APRESOLINE) 25 MG tablet Take 25 mg by mouth 3 (three) times daily.     . metoprolol tartrate (LOPRESSOR) 25 MG tablet Take 1 tablet (25 mg total) by mouth 2 (two) times daily. 60 tablet 5  . Nutritional Supplements (FEEDING SUPPLEMENT, NEPRO CARB STEADY,) LIQD Take 237 mLs by mouth 2 (two) times daily between meals.  0  . omega-3 acid ethyl esters (LOVAZA) 1 g capsule  Take 1 g by mouth 2 (two) times daily.    . phenol (CHLORASEPTIC) 1.4 % LIQD Use as directed 2 sprays in the mouth or throat every 8 (eight) hours as needed for throat irritation / pain.    Marland Kitchen senna (SENOKOT) 8.6 MG TABS tablet Take 1 tablet by mouth daily.    . Amino Acids-Protein Hydrolys (FEEDING SUPPLEMENT, PRO-STAT SUGAR FREE 64,) LIQD Take 30 mLs by mouth 2 (two) times daily.    Marland Kitchen amLODipine (NORVASC) 10 MG tablet Take 10 mg by mouth at bedtime.   0  . atorvastatin (LIPITOR) 40 MG tablet Take 40 mg by mouth at bedtime.    . bisacodyl (DULCOLAX) 10 MG suppository Place 10 mg rectally daily as needed (constipation not relieved by MOM).    . clopidogrel (PLAVIX) 75 MG tablet Take 1 tablet (75 mg total) by mouth daily. (Patient not taking: Reported on 03/29/2018) 30 tablet 2  . docusate sodium (COLACE) 100 MG capsule Take 200 mg by mouth at bedtime.     Marland Kitchen doxercalciferol (HECTOROL) 4 MCG/2ML injection Inject 1 mL (2 mcg total) into the vein every Monday, Wednesday, and Friday with hemodialysis. (Patient not taking: Reported on 03/29/2018) 2 mL   . isosorbide mononitrate (IMDUR) 30 MG 24 hr tablet Take 30 mg by mouth daily.    . multivitamin (RENA-VIT) TABS tablet Take 1 tablet by mouth at bedtime. (Patient not taking: Reported on 03/29/2018) 30 each 0  . Omega-3 Fatty Acids (FISH OIL) 1000 MG CAPS Take 1,000 mg by mouth 2 (two) times daily.       ROS: As per HPI otherwise negative.  Physical Exam: Vitals:   03/29/18 0500 03/29/18 0510 03/29/18 0530 03/29/18 0600  BP: (!) 80/49 92/60 110/67 (P) 113/66  Pulse: 80 82 90 (P) 93  Resp:      Temp:      TempSrc:      SpO2:      Weight:         General: Thin elderly male on nasal oxygen NAD Head: NCAT sclera not icteric  Neck:  No JVD No masses Lungs: Grossly CTAB  Heart: RRR with S1 S2 Abdomen: soft NT + BS Lower extremities: Muscle wasting, trace LE edema, no open wounds  Neuro: A & O  X 3. Moves all extremities spontaneously. Psych:   Responds to questions appropriately with a normal affect. Dialysis Access: LUE AVF cannulated on HD   Labs: Basic Metabolic Panel: Recent Labs  Lab 03/29/18 0146  NA 133*  K 5.8*  CL 95*  CO2 22  GLUCOSE 218*  BUN 50*  CREATININE 9.29*  CALCIUM 9.3   Liver Function Tests: No results for input(s): AST, ALT, ALKPHOS, BILITOT, PROT, ALBUMIN in the last 168 hours. No results for input(s): LIPASE, AMYLASE in the last 168 hours. No results for input(s): AMMONIA in the last 168 hours. CBC: Recent Labs  Lab 03/29/18 0146  WBC 11.4*  NEUTROABS 7.9*  HGB 11.7*  HCT 35.8*  MCV 88.8  PLT 201   Cardiac Enzymes: No results for  input(s): CKTOTAL, CKMB, CKMBINDEX, TROPONINI in the last 168 hours. CBG: Recent Labs  Lab 03/29/18 0134  GLUCAP 192*   Iron Studies: No results for input(s): IRON, TIBC, TRANSFERRIN, FERRITIN in the last 72 hours. Studies/Results: Dg Chest Port 1 View  Result Date: 03/29/2018 CLINICAL DATA:  Increasing cough and shortness of breath tonight. EXAM: PORTABLE CHEST 1 VIEW COMPARISON:  01/11/2018 FINDINGS: Cardiac enlargement. Diffuse interstitial pattern to the lungs consistent with interstitial edema. No focal consolidation. No pleural effusions. No pneumothorax. Mild cardiac enlargement. Mediastinal contours appear intact. Calcification of the aorta. IMPRESSION: Diffuse interstitial pattern to the lungs consistent with interstitial edema. No consolidation. Aortic atherosclerosis. Electronically Signed   By: Lucienne Capers M.D.   On: 03/29/2018 02:15    Dialysis Orders:  [facility] MWF 4h 180NRe  400/A1.5x  EDW 58kg 2K/2Ca Profile 4 L AVF No heparin Hectorol 3 mcg IV TIW Mircera 23mcg IV q 2 weeks (last 4/24)   Assessment/Plan: 1. Dyspnea/Pulm edema - Requiring BiPAP on admission, now improved with urgent HD this am. Net UF 2L. May need serial HD. Will repeat cxr tonight and reassess in am for poss extra hd tuesdsay, didn't tolerate large uf today.  2. ESRD  -  MWF. HD today and possibly tomorrow.  3. Hypertension/volume  - BP improved with UF. Follow weights, titrate volume down as tolerated. Amlodipine 10, metoprolol 25 bid on OP med list  4. Anemia  - Hgb 11.7. ESA recently dosed.  5. Metabolic bone disease -  Continue Hectorol, Auryxia 2 tabs tid, Sensipar 90 qd 6. Nutrition - Renal diet/vitamins 7. NICM/CHF- EF 45-50% 8. Hx CVA   Lynnda Child PA-C [place] Pager [phone] 03/29/2018, 9:26 AM   Pt seen, examined, agree w assess/plan as above with additions as indicated.  Kelly Splinter MD [employer] pager [phone]    cell [phone] 03/29/2018, 3:35 PM

## 2018-03-29 NOTE — Procedures (Signed)
   I was present at this dialysis session, have reviewed the session itself and made  appropriate changes Kelly Splinter MD [place] pager [phone]   03/29/2018, 9:05 AM

## 2018-03-29 NOTE — ED Notes (Signed)
PT transported to HD on bipap, pt appears comfortable.

## 2018-03-29 NOTE — ED Triage Notes (Signed)
Pt transported from [facility] after feeling like pressure was high, pt is HD, last tx Friday. RA sat 77, Pt on bipap on arrival, sat 98 on arrival.  I nitro given enroute with little change in BP. 22 SL R hand.  Alert on arrival

## 2018-03-29 NOTE — Progress Notes (Signed)
Pt transported to dialysis without event on servo on bipap mode.

## 2018-03-29 NOTE — ED Provider Notes (Signed)
[facility] EMERGENCY DEPARTMENT Provider Note   CSN: [id number] Arrival date & time: 03/29/18  0140     History   Chief Complaint Chief Complaint  Patient presents with  . Shortness of Breath    HPI Trevor [name] is a 72 y.o. male.  Patient is a 72 year old male with past medical history of end-stage renal disease on hemodialysis, hypertension, CHF.  He was brought for evaluation of respiratory distress that developed suddenly while at his extended care facility.  He was last dialyzed on Friday and is due to be dialyzed again today.  He developed the sudden onset of difficulty breathing, elevated blood pressure, and rattling in his chest.  He denies any fevers or chills.  He was placed on CPAP by EMS, then transported here.  The history is provided by the patient.  Shortness of Breath  This is a recurrent problem. The problem occurs continuously.The current episode started 1 to 2 hours ago. The problem has been rapidly worsening. Pertinent negatives include no fever, no sputum production and no chest pain. He has tried nothing for the symptoms. The treatment provided no relief.    Past Medical History:  Diagnosis Date  . AAA (abdominal aortic aneurysm) ([place])   . Anxiety   . Arthritis   . Blindness   . CHF (congestive heart failure) ([place])   . 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 ESRD on hemodialysis ([place])    started HD 2014-15  . Headache(784.0)   . HTN (hypertension)   . Protein-calorie malnutrition [facility])     Patient Active Problem List   Diagnosis Date Noted  . Acute bronchitis   . Respiratory failure with hypoxia ([place]) 01/11/2018  . Sleep stage dysfunction 11/10/2017  . 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 Vascular device, implant, or graft complication 62/94/7654  . Peripheral arterial disease ([place]) 10/20/2017  . Non-allergic rhinitis 10/20/2017  . Pain due to onychomycosis of toenails of both feet  07/30/2017  . 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 BPH (benign prostatic hyperplasia) 07/07/2017  . At risk for adverse drug reaction 07/02/2017  . Ischemia of extremity 06/28/2017  . Pulmonary edema 06/27/2017  . Weakness of both legs 01/25/2017  . Hypertensive urgency 01/24/2017  . 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 Ischemic chest pain 11/01/2016  . Hypoxemia 09/07/2016  . Essential hypertension 03/08/2016  . Orthostatic hypotension 01/12/2016  . Dyslipidemia 01/12/2016  . Accelerated hypertension 08/12/2015  . Carotid stenosis   . Stroke ([place]) 04/03/2015  . Abdominal aortic aneurysm ([place]) 03/06/2015  . Generalized weakness 09/08/2014  . Acute encephalopathy 09/07/2014  . Protein-calorie malnutrition, severe ([place]) 05/20/2014  . Syncope and collapse 05/19/2014  . End-stage renal disease on hemodialysis ([place]) 05/19/2014  . Anemia of renal disease 05/19/2014  . Coronary artery disease, non-occlusive:  09/28/2012  . Secondary hyperparathyroidism ([place]) 09/27/2012  . Non compliance with medical treatment 09/27/2012  . Chronic combined systolic and diastolic CHF (congestive heart failure) ([place]) 10/10/2011  . A-fib ([place]) 10/08/2011  . History of stroke 10/07/2011  . Cocaine abuse ([place]) 10/07/2011  . Nonischemic cardiomyopathy ([place]) 09/18/2011  . Tobacco use disorder 08/07/2011  . Bruit 08/07/2011    Past Surgical History:  Procedure Laterality Date  . AXILLARY-FEMORAL BYPASS GRAFT  06/28/2017   Procedure: BYPASS GRAFT RIGHT AXILLA-BIFEMORAL USING 8MM X 30CM AND 8MM X 60CM HEMASHIELD GOLD GRAFTS;  Surgeon: Rosetta Posner, MD;  Location: [facility];  Service:  Vascular;;  . CARDIAC CATHETERIZATION N/A 11/01/2016   Procedure: Left Heart Cath and Coronary Angiography;  Surgeon: Lorretta Harp, MD;  Location: [facility] CV LAB;  Service: Cardiovascular;  Laterality: N/A;  . ENDARTERECTOMY FEMORAL Bilateral 06/28/2017   Procedure: ENDARTERECTOMY BILATERAL FEMORAL ARTERIES;   Surgeon: Rosetta Posner, MD;  Location: [facility];  Service: Vascular;  Laterality: Bilateral;  . INSERTION OF DIALYSIS CATHETER  09/21/2012   Procedure: INSERTION OF DIALYSIS CATHETER;  Surgeon: Angelia Mould, MD;  Location: [facility];  Service: Vascular;  Laterality: N/A;  Right Internal Jugular Placement  . LEFT HEART CATHETERIZATION WITH CORONARY ANGIOGRAM N/A 09/28/2012   Procedure: LEFT HEART CATHETERIZATION WITH CORONARY ANGIOGRAM;  Surgeon: Sherren Mocha, MD;  Location[facility] C[facility] LAB;  Service: Cardiovascular;  Laterality: N/A;  . LEFT HEART CATHETERIZATION WITH CORONARY ANGIOGRAM N/A 07/07/2014   Procedure: LEFT HEART CATHETERIZATION WITH CORONARY ANGIOGRAM;  Surgeon: Leonie Man, MD;  Location: [facility] CATH LAB;  Service: Cardiovascular;  Laterality: N/A;  . right hand    . TEE WITHOUT CARDIOVERSION  10/09/2011   Procedure: TRANSESOPHAGEAL ECHOCARDIOGRAM (TEE);  Surgeon: Lelon Perla, MD;  Location: [facility] ENDOSCOPY;  Service: Cardiovascular;  Laterality: N/A;        Home Medications    Prior to Admission medications   Medication Sig Start Date End Date Taking? Authorizing Provider  Amino Acids-Protein Hydrolys (FEEDING SUPPLEMENT, PRO-STAT SUGAR FREE 64,) LIQD Take 30 mLs by mouth 2 (two) times daily.    [provider]  amLODipine (NORVASC) 10 MG tablet Take 10 mg by mouth at bedtime.  04/13/17   [provider]  aspirin 81 MG chewable tablet Chew 1 tablet (81 mg total) by mouth daily. 11/04/16   Lyda Jester M, PA-C  atorvastatin (LIPITOR) 40 MG tablet Take 40 mg by mouth at bedtime.    [provider]  bisacodyl (DULCOLAX) 10 MG suppository Place 10 mg rectally daily as needed (constipation not relieved by MOM).    [provider]  clopidogrel (PLAVIX) 75 MG tablet Take 1 tablet (75 mg total) by mouth daily. 04/05/15   Barton Dubois, MD  docusate sodium (COLACE) 100 MG capsule Take 200 mg by mouth at bedtime.     [provider]    doxercalciferol (HECTOROL) 4 MCG/2ML injection Inject 1 mL (2 mcg total) into the vein every Monday, Wednesday, and Friday with hemodialysis. 01/15/18   Colbert Ewing, MD  ferric citrate (AURYXIA) 1 GM 210 MG(Fe) tablet Take 630 mg by mouth 3 (three) times daily with meals.     [provider]  fluticasone (FLONASE) 50 MCG/ACT nasal spray Place 1 spray into both nostrils 2 (two) times daily.     [provider]  gabapentin (NEURONTIN) 300 MG capsule Take 1 capsule (300 mg total) by mouth at bedtime. 01/14/18   Colbert Ewing, MD  hydrALAZINE (APRESOLINE) 25 MG tablet Take 25 mg by mouth 3 (three) times daily.     [provider]  isosorbide mononitrate (IMDUR) 30 MG 24 hr tablet Take 30 mg by mouth daily.    [provider]  metoprolol tartrate (LOPRESSOR) 25 MG tablet Take 1 tablet (25 mg total) by mouth 2 (two) times daily. 11/03/16   Lyda Jester M, PA-C  multivitamin (RENA-VIT) TABS tablet Take 1 tablet by mouth at bedtime. 07/02/17   Velvet Bathe, MD  Nutritional Supplements (FEEDING SUPPLEMENT, NEPRO CARB STEADY,) LIQD Take 237 mLs by mouth 2 (two) times daily between meals. 12/11/16   Eliseo Squires,  Tomi Bamberger, DO  Omega-3 Fatty Acids (FISH OIL) 1000 MG CAPS Take 1,000 mg by mouth 2 (two) times daily.    [provider]    Family History Family History  Problem Relation Age of Onset  . Heart attack Mother        MI in her 65s  . Diabetes Mother   . Alcohol abuse Father   . Anesthesia problems Neg Hx   . Hypotension Neg Hx   . Malignant hyperthermia Neg Hx   . Pseudochol deficiency Neg Hx     Social History Social History   Tobacco Use  . Smoking status: Current Every Day Smoker    Packs/day: 0.50    Years: 60.00    Pack years: 30.00    Types: Cigarettes  . Smokeless tobacco: Never Used  . Tobacco comment: 02/09/18:3-4 /day  Substance Use Topics  . Alcohol use: No    Alcohol/week: 0.0 oz    Comment: Occasional  . Drug use: No     Frequency: 2.0 times per week    Types: "Crack" cocaine     Allergies   Patient has no known allergies.   Review of Systems Review of Systems  Constitutional: Negative for fever.  Respiratory: Positive for shortness of breath. Negative for sputum production.   Cardiovascular: Negative for chest pain.  All other systems reviewed and are negative.    Physical Exam Updated Vital Signs There were no vitals taken for this visit.  Physical Exam  Constitutional: He is oriented to person, place, and time. He appears well-developed and well-nourished. No distress.  HENT:  Head: Normocephalic and atraumatic.  Mouth/Throat: Oropharynx is clear and moist.  Neck: Normal range of motion. Neck supple.  Cardiovascular: Normal rate and regular rhythm. Exam reveals no friction rub.  No murmur heard. Pulmonary/Chest: Effort normal. No respiratory distress. He has no wheezes. He has rales in the right lower field and the left lower field.  There are rales in the bilateral lower lung fields.  Abdominal: Soft. Bowel sounds are normal. He exhibits no distension. There is no tenderness.  Musculoskeletal: Normal range of motion. He exhibits no edema.       Right lower leg: He exhibits no edema.       Left lower leg: He exhibits no edema.  Neurological: He is alert and oriented to person, place, and time. Coordination normal.  Skin: Skin is warm and dry. He is not diaphoretic.  Nursing note and vitals reviewed.    ED Treatments / Results  Labs (all labs ordered are listed, but only abnormal results are displayed) Labs Reviewed  CBG MONITORING, ED - Abnormal; Notable for the following components:      Result Value   Glucose-Capillary 192 (*)    All other components within normal limits  CBC WITH DIFFERENTIAL/PLATELET  BASIC METABOLIC PANEL  BRAIN NATRIURETIC PEPTIDE    EKG None  Radiology No results found.  Procedures Procedures (including critical care time)  Medications Ordered  in ED Medications - No data to display   Initial Impression / Assessment and Plan / ED Course  I have reviewed the triage vital signs and the nursing notes.  Pertinent labs & imaging results that were available during my care of the patient were reviewed by me and considered in my medical decision making (see chart for details).  Patient brought from extended care facility for evaluation of sudden onset difficulty breathing and hypertension.  He has a history of end-stage renal disease and  he appears to be volume overloaded.  He is requiring BiPAP to maintain oxygenation.  His potassium is 5.8 with no EKG changes.  I have discussed the care with Dr. Lorrene Reid from nephrology who would like the patient admitted to the hospitalist service so that dialysis can be performed in the morning.  Dr. Myna Hidalgo agrees to admit.  CRITICAL CARE Performed by: Veryl Speak Total critical care time: 35 minutes Critical care time was exclusive of separately billable procedures and treating other patients. Critical care was necessary to treat or prevent imminent or life-threatening deterioration. Critical care was time spent personally by me on the following activities: development of treatment plan with patient and/or surrogate as well as nursing, discussions with consultants, evaluation of patient's response to treatment, examination of patient, obtaining history from patient or surrogate, ordering and performing treatments and interventions, ordering and review of laboratory studies, ordering and review of radiographic studies, pulse oximetry and re-evaluation of patient's condition.   Final Clinical Impressions(s) / ED Diagnoses   Final diagnoses:  None    ED Discharge Orders    None       Veryl Speak, MD 03/29/18 [phone]

## 2018-03-29 NOTE — Progress Notes (Signed)
[facility] TEAM 1 - Stepdown/ICU TEAM  NILS THOR  OZH:[id number] DOB: 1946/08/11 DOA: 03/29/2018 PCP: [name]    Brief Narrative:  72 y.o. male with a hx of ESRD on hemodialysis, chronic combined systolic/diastolic CHF, hypertension, CVA, and CAD who presented from his [name] w/ shortness of breath.  Patient was last dialyzed on 03/26/2018.  He had been in his usual state until the day before when he noted shortness of breath.  He acutely worsened overnight and EMS was called.  He was saturating 77% on room air upon EMS arrival and was transported to the ED with CPAP.  In the ED a CXR was concerning for diffuse interstitial edema.    Significant Events: 4/29 admit   Subjective: Pt seen for a f/u visit.  He feels markedly better following HD.    Assessment & Plan:  Acute pulmonary edema - acute hypoxic respiratory failure    ESRD on HD M/W/F  Modest Hyperkalemia   Corrected w/ HD   CAD  CVA  No acute deficits identified   Anemia; occult GI bleeding  Hgb 11.7 on admission, up from 9.6 in February - FOBT was positive in ED - hold ASA and Plavix - check anemia panel   Hypertension   DVT prophylaxis: SCDs Code Status: FULL CODE Family Communication: no family present at time of exam  Disposition Plan:   Consultants:  Nephrology   Antimicrobials:  none  Objective: Blood pressure (!) 141/79, pulse 83, temperature 98.4 F (36.9 C), temperature source Oral, resp. rate 19, weight 59 kg (130 lb), SpO2 97 %.  Intake/Output Summary (Last 24 hours) at 03/29/2018 1455 Last data filed at 03/29/2018 0800 Gross per 24 hour  Intake -  Output 2000 ml  Net -2000 ml   Filed Weights   03/29/18 0202  Weight: 59 kg (130 lb)    Examination: Pt was seen for a f/u visit.    CBC: Recent Labs  Lab 03/29/18 0146 03/29/18 1031  WBC 11.4*  --   NEUTROABS 7.9*  --   HGB 11.7* 10.5*  HCT 35.8* 31.2*  MCV 88.8  --   PLT 201  --    Basic Metabolic  Panel: Recent Labs  Lab 03/29/18 0146 03/29/18 1031  NA 133*  --   K 5.8* 4.0  CL 95*  --   CO2 22  --   GLUCOSE 218*  --   BUN 50*  --   CREATININE 9.29*  --   CALCIUM 9.3  --    GFR: Estimated Creatinine Clearance: 6 mL/min (A) (by C-G formula based on SCr of 9.29 mg/dL (H)).  Liver Function Tests: No results for input(s): AST, ALT, ALKPHOS, BILITOT, PROT, ALBUMIN in the last 168 hours. No results for input(s): LIPASE, AMYLASE in the last 168 hours. No results for input(s): AMMONIA in the last 168 hours.  Coagulation Profile: No results for input(s): INR, PROTIME in the last 168 hours.  Cardiac Enzymes: Recent Labs  Lab 03/29/18 1031  TROPONINI 0.05*    HbA1C: Hemoglobin A1C  Date/Time Value Ref Range Status  07/31/2017 4.4  Final  1946/03/21 4.4  Final   Hgb A1c MFr Bld  Date/Time Value Ref Range Status  01/25/2017 12:59 AM <4.2 (L) 4.8 - 5.6 % Final    Comment:    (NOTE) **Verified by repeat analysis**         Pre-diabetes: 5.7 - 6.4         Diabetes: >6.4  Glycemic control for adults with diabetes: <7.0   09/07/2016 01:19 PM 4.6 (L) 4.8 - 5.6 % Final    Comment:    (NOTE)         Pre-diabetes: 5.7 - 6.4         Diabetes: >6.4         Glycemic control for adults with diabetes: <7.0     CBG: Recent Labs  Lab 03/29/18 0134  GLUCAP 192*    Scheduled Meds: . atorvastatin  40 mg Oral q1800  . cinacalcet  90 mg Oral Q supper  . docusate sodium  200 mg Oral QHS  . doxercalciferol  3 mcg Intravenous Q M,W,F-HD  . feeding supplement (NEPRO CARB STEADY)  237 mL Oral BID BM  . feeding supplement (PRO-STAT SUGAR FREE 64)  30 mL Oral BID BM  . ferric citrate  630 mg Oral TID WC  . gabapentin  300 mg Oral QHS  . hydrALAZINE  25 mg Oral TID  . isosorbide mononitrate  30 mg Oral Daily  . metoprolol tartrate  25 mg Oral BID  . multivitamin  1 tablet Oral QHS  . omega-3 acid ethyl esters  1 g Oral BID  . sodium chloride flush  3 mL Intravenous  Q12H  . sodium chloride flush  3 mL Intravenous Q12H     LOS: 0 days   [name], MD [facility] Office  [phone] Pager - Text Page per Shea Evans as per below:  On-Call/Text Page:      [email or web address]      password [email or web address]  If 7PM-7AM, please contact night-coverage [email or web address] Password [email or web address] 03/29/2018, 2:55 PM

## 2018-03-29 NOTE — H&P (Signed)
History and Physical    Trevor [name] EGB:[id number] DOB: 03-22-1946 DOA: 03/29/2018  PCP: [name]   Patient coming from: Home  Chief Complaint: SOB  HPI: Trevor [name] is a 72 y.o. male with medical history significant for end-stage renal disease on hemodialysis, chronic combined systolic/diastolic CHF, hypertension, history of CVA, and coronary artery disease, now presenting to the emergency department from his nursing home for evaluation of shortness of breath.  Patient was last dialyzed on 03/26/2018 and is due for dialysis today.  He had been in his usual state until yesterday when he noted shortness of breath.  He was experiencing some chest discomfort yesterday, but none today.  He denies any fevers, chills, or significant cough.  Denies dietary indiscretions.  He acutely worsened overnight and EMS was called.  He was saturating 77% on room air upon EMS arrival and was transported to the ED with CPAP.  ED Course: Upon arrival to the ED, patient is found to be afebrile, saturating adequately on BiPAP, and with vitals otherwise normal.  EKG features a sinus tachycardia with rate 104 and LVH with repolarization abnormality and anterior Q waves.  Chest x-ray is concerning for diffuse interstitial edema.  Chemistry panel is notable for sodium 133, potassium 5.8, BUN 50, and glucose 218.  CBC features a leukocytosis to 11,400 and a normocytic anemia with hemoglobin of 11.7, up from 9.6 in February.  Fecal occult blood testing is positive.  Troponin is normal.  Lactic acid is elevated to 5.37.  Patient was placed on BiPAP in the ED, remains hemodynamically stable, nephrology was consulted by the ED physician for dialysis, and the patient will be admitted to the stepdown unit.  Review of Systems:  All other systems reviewed and apart from HPI, are negative.  Past Medical History:  Diagnosis Date  . AAA (abdominal aortic aneurysm) ([place])   . Anxiety   . Arthritis   . Blindness     . CHF (congestive heart failure) ([place])   . 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 ESRD on hemodialysis ([place])    started HD 2014-15  . Headache(784.0)   . HTN (hypertension)   . Protein-calorie malnutrition ([place])     Past Surgical History:  Procedure Laterality Date  . AXILLARY-FEMORAL BYPASS GRAFT  06/28/2017   Procedure: BYPASS GRAFT RIGHT AXILLA-BIFEMORAL USING 8MM X 30CM AND 8MM X 60CM HEMASHIELD GOLD GRAFTS;  Surgeon: Rosetta Posner, MD;  Location: [place] [facility];  Service: Vascular;;  . CARDIAC CATHETERIZATION N/A 11/01/2016   Procedure: Left Heart Cath and Coronary Angiography;  Surgeon: Lorretta Harp, MD;  Location: [place] CV LAB;  Service: Cardiovascular;  Laterality: N/A;  . ENDARTERECTOMY FEMORAL Bilateral 06/28/2017   Procedure: ENDARTERECTOMY BILATERAL FEMORAL ARTERIES;  Surgeon: Rosetta Posner, MD;  Location: [employer];  Service: Vascular;  Laterality: Bilateral;  . INSERTION OF DIALYSIS CATHETER  09/21/2012   Procedure: INSERTION OF DIALYSIS CATHETER;  Surgeon: Angelia Mould, MD;  Location: [place];  Service: Vascular;  Laterality: N/A;  Right Internal Jugular Placement  . LEFT HEART CATHETERIZATION WITH CORONARY ANGIOGRAM N/A 09/28/2012   Procedure: LEFT HEART CATHETERIZATION WITH CORONARY ANGIOGRAM;  Surgeon: Sherren Mocha, MD;  Location: [facility] CATH LAB;  Service: Cardiovascular;  Laterality: N/A;  . LEFT HEART CATHETERIZATION WITH CORONARY ANGIOGRAM N/A 07/07/2014   Procedure: LEFT HEART CATHETERIZATION WITH CORONARY ANGIOGRAM;  Surgeon: Leonie Man, MD;  Location: [facility] CATH  LAB;  Service: Cardiovascular;  Laterality: N/A;  . right hand    . TEE WITHOUT CARDIOVERSION  10/09/2011   Procedure: TRANSESOPHAGEAL ECHOCARDIOGRAM (TEE);  Surgeon: Lelon Perla, MD;  Location: [facility] ENDOSCOPY;  Service: Cardiovascular;  Laterality: N/A;     reports that he has been smoking cigarettes.  He has a 30.00 pack-year smoking history. He has  never used smokeless tobacco. He reports that he does not drink alcohol or use drugs.  No Known Allergies  Family History  Problem Relation Age of Onset  . Heart attack Mother        MI in her 63s  . Diabetes Mother   . Alcohol abuse Father   . Anesthesia problems Neg Hx   . Hypotension Neg Hx   . Malignant hyperthermia Neg Hx   . Pseudochol deficiency Neg Hx      Prior to Admission medications   Medication Sig Start Date End Date Taking? Authorizing Provider  Amino Acids-Protein Hydrolys (FEEDING SUPPLEMENT, PRO-STAT SUGAR FREE 64,) LIQD Take 30 mLs by mouth 2 (two) times daily.    [provider]  amLODipine (NORVASC) 10 MG tablet Take 10 mg by mouth at bedtime.  04/13/17   [provider]  aspirin 81 MG chewable tablet Chew 1 tablet (81 mg total) by mouth daily. 11/04/16   Lyda Jester M, PA-C  atorvastatin (LIPITOR) 40 MG tablet Take 40 mg by mouth at bedtime.    [provider]  bisacodyl (DULCOLAX) 10 MG suppository Place 10 mg rectally daily as needed (constipation not relieved by MOM).    [provider]  clopidogrel (PLAVIX) 75 MG tablet Take 1 tablet (75 mg total) by mouth daily. 04/05/15   Barton Dubois, MD  docusate sodium (COLACE) 100 MG capsule Take 200 mg by mouth at bedtime.     [provider]  doxercalciferol (HECTOROL) 4 MCG/2ML injection Inject 1 mL (2 mcg total) into the vein every Monday, Wednesday, and Friday with hemodialysis. 01/15/18   Colbert Ewing, MD  ferric citrate (AURYXIA) 1 GM 210 MG(Fe) tablet Take 630 mg by mouth 3 (three) times daily with meals.     [provider]  fluticasone (FLONASE) 50 MCG/ACT nasal spray Place 1 spray into both nostrils 2 (two) times daily.     [provider]  gabapentin (NEURONTIN) 300 MG capsule Take 1 capsule (300 mg total) by mouth at bedtime. 01/14/18   Colbert Ewing, MD  hydrALAZINE (APRESOLINE) 25 MG tablet Take 25 mg by mouth 3 (three) times daily.      [provider]  isosorbide mononitrate (IMDUR) 30 MG 24 hr tablet Take 30 mg by mouth daily.    [provider]  metoprolol tartrate (LOPRESSOR) 25 MG tablet Take 1 tablet (25 mg total) by mouth 2 (two) times daily. 11/03/16   Lyda Jester M, PA-C  multivitamin (RENA-VIT) TABS tablet Take 1 tablet by mouth at bedtime. 07/02/17   Velvet Bathe, MD  Nutritional Supplements (FEEDING SUPPLEMENT, NEPRO CARB STEADY,) LIQD Take 237 mLs by mouth 2 (two) times daily between meals. 12/11/16   Geradine Girt, DO  Omega-3 Fatty Acids (FISH OIL) 1000 MG CAPS Take 1,000 mg by mouth 2 (two) times daily.    [provider]    Physical Exam: Vitals:   03/29/18 0141 03/29/18 0145 03/29/18 0200 03/29/18 0202  BP:   (!) 147/85   Pulse:   94   Resp:   20   Temp:   98.7 F (37.1  C)   TempSrc:   Rectal   SpO2: 98% 95% 100%   Weight:    59 kg (130 lb)      Constitutional: On BiPAP, calm  Eyes: PERTLA, lids and conjunctivae normal ENMT: Mucous membranes are moist. Posterior pharynx clear of any exudate or lesions.   Neck: normal, supple, no masses, no thyromegaly Respiratory: Diffuse rales. No pallor. No accessory muscle use.  Cardiovascular: S1 & S2 heard, regular rate and rhythm. No extremity edema.   Abdomen: No distension, no tenderness, soft. Bowel sounds normal.  Musculoskeletal: no clubbing / cyanosis. No joint deformity upper and lower extremities. Skin: no significant rashes, lesions, ulcers. Warm, dry, well-perfused. Neurologic: No facial asymmetry. Sensation intact. Moving all extremities.  Psychiatric: Alert and oriented x 3. Calm, cooperative.     Labs on Admission: I have personally reviewed following labs and imaging studies  CBC: Recent Labs  Lab 03/29/18 0146  WBC 11.4*  NEUTROABS 7.9*  HGB 11.7*  HCT 35.8*  MCV 88.8  PLT 270   Basic Metabolic Panel: Recent Labs  Lab 03/29/18 0146  NA 133*  K 5.8*  CL 95*  CO2 22  GLUCOSE 218*  BUN 50*   CREATININE 9.29*  CALCIUM 9.3   GFR: Estimated Creatinine Clearance: 6 mL/min (A) (by C-G formula based on SCr of 9.29 mg/dL (H)). Liver Function Tests: No results for input(s): AST, ALT, ALKPHOS, BILITOT, PROT, ALBUMIN in the last 168 hours. No results for input(s): LIPASE, AMYLASE in the last 168 hours. No results for input(s): AMMONIA in the last 168 hours. Coagulation Profile: No results for input(s): INR, PROTIME in the last 168 hours. Cardiac Enzymes: No results for input(s): CKTOTAL, CKMB, CKMBINDEX, TROPONINI in the last 168 hours. BNP (last 3 results) No results for input(s): PROBNP in the last 8760 hours. HbA1C: No results for input(s): HGBA1C in the last 72 hours. CBG: Recent Labs  Lab 03/29/18 0134  GLUCAP 192*   Lipid Profile: No results for input(s): CHOL, HDL, LDLCALC, TRIG, CHOLHDL, LDLDIRECT in the last 72 hours. Thyroid Function Tests: No results for input(s): TSH, T4TOTAL, FREET4, T3FREE, THYROIDAB in the last 72 hours. Anemia Panel: No results for input(s): VITAMINB12, FOLATE, FERRITIN, TIBC, IRON, RETICCTPCT in the last 72 hours. Urine analysis:    Component Value Date/Time   COLORURINE YELLOW 04/04/2017 1833   APPEARANCEUR CLEAR 04/04/2017 1833   LABSPEC 1.008 04/04/2017 1833   PHURINE 9.0 (H) 04/04/2017 1833   GLUCOSEU 50 (A) 04/04/2017 1833   HGBUR MODERATE (A) 04/04/2017 1833   BILIRUBINUR NEGATIVE 04/04/2017 1833   [place] 04/04/2017 1833   PROTEINUR 100 (A) 04/04/2017 1833   UROBILINOGEN 0.2 01/08/2015 2116   NITRITE NEGATIVE 04/04/2017 1833   LEUKOCYTESUR NEGATIVE 04/04/2017 1833   Sepsis Labs: @LABRCNTIP (procalcitonin:4,lacticidven:4) )No results found for this or any previous visit (from the past 240 hour(s)).   Radiological Exams on Admission: Dg Chest Port 1 View  Result Date: 03/29/2018 CLINICAL DATA:  Increasing cough and shortness of breath tonight. EXAM: PORTABLE CHEST 1 VIEW COMPARISON:  01/11/2018 FINDINGS: Cardiac  enlargement. Diffuse interstitial pattern to the lungs consistent with interstitial edema. No focal consolidation. No pleural effusions. No pneumothorax. Mild cardiac enlargement. Mediastinal contours appear intact. Calcification of the aorta. IMPRESSION: Diffuse interstitial pattern to the lungs consistent with interstitial edema. No consolidation. Aortic atherosclerosis. Electronically Signed   By: Lucienne Capers M.D.   On: 03/29/2018 02:15    EKG: Independently reviewed. Sinus tachycardia (rate 104), LVH with repolarization abnormality and anterior  Q-wave.   Assessment/Plan  1. Acute pulmonary edema; acute hypoxic respiratory failure   - Presents with acute SOB, saturating 77% on rm air upon EMS arrival and transported on CPAP, transitioned to BiPAP in ED  - CXR with diffuse pulm edema  - Denies dietary/fluid indiscretions  - Troponin wnl  - Nephrology consulting and much appreciate, planning for HD this am  - Continue BiPAP for now, anticipate improvement with HD    2. ESRD; hyperkalemia   - Last dialyzed on 4/26, due for HD today  - Presents with acute pulmonary edema, found to have potassium 5.8   - Nephrology consulting and much appreciated, plannig for HD this am  - Continue cardiac monitoring, follow serial potassium levels until normal   3. CAD - Reports chest pain the day prior to admission, none now  - Initial troponin 0.04 in ED, CXR with diffuse edema  - Continue cardiac monitoring, check another troponin, continue statin, beta-blocker, and nitrate, hold ASA and Plavix in light of occult GIB   4. Hx of CVA  - No acute deficits identified  - Continue statin, hold ASA and Plavix in light of occult GIB    5. Anemia; occult GI bleeding  - Hgb is 11.7 on admission, up from 9.6 in February  - FOBT is positive in ED  - Hold ASA and Plavix, trend H&H    6. Hypertension  - BP at goal  - Continue Norvasc, Lopressor, and hydralazine  7. Elevated lactate  - Lactic acid is  5.37 on admission  - Likely secondary to volume-overload  - Does not appear to be infected - Anticipate improvement with HD, will trend      DVT prophylaxis: SCD's Code Status: Full  Family Communication: Discussed with patient Consults called: Nephrology Admission status: Inpatient    Vianne Bulls, MD [facility] Pager [phone]  If 7PM-7AM, please contact night-coverage [email or web address] Password [email or web address]  03/29/2018, 3:05 AM

## 2018-03-30 DIAGNOSIS — N2581 Secondary hyperparathyroidism of renal origin: Secondary | ICD-10-CM | POA: Diagnosis not present

## 2018-03-30 DIAGNOSIS — I5042 Chronic combined systolic (congestive) and diastolic (congestive) heart failure: Secondary | ICD-10-CM | POA: Diagnosis not present

## 2018-03-30 DIAGNOSIS — J9601 Acute respiratory failure with hypoxia: Secondary | ICD-10-CM | POA: Diagnosis not present

## 2018-03-30 DIAGNOSIS — R079 Chest pain, unspecified: Secondary | ICD-10-CM | POA: Diagnosis not present

## 2018-03-30 DIAGNOSIS — I120 Hypertensive chronic kidney disease with stage 5 chronic kidney disease or end stage renal disease: Secondary | ICD-10-CM | POA: Diagnosis not present

## 2018-03-30 DIAGNOSIS — N186 End stage renal disease: Secondary | ICD-10-CM | POA: Diagnosis not present

## 2018-03-30 DIAGNOSIS — Z992 Dependence on renal dialysis: Secondary | ICD-10-CM | POA: Diagnosis not present

## 2018-03-30 DIAGNOSIS — I429 Cardiomyopathy, unspecified: Secondary | ICD-10-CM | POA: Diagnosis not present

## 2018-03-30 DIAGNOSIS — G464 Cerebellar stroke syndrome: Secondary | ICD-10-CM | POA: Diagnosis not present

## 2018-03-30 DIAGNOSIS — I132 Hypertensive heart and chronic kidney disease with heart failure and with stage 5 chronic kidney disease, or end stage renal disease: Secondary | ICD-10-CM | POA: Diagnosis not present

## 2018-03-30 DIAGNOSIS — D649 Anemia, unspecified: Secondary | ICD-10-CM | POA: Diagnosis not present

## 2018-03-30 DIAGNOSIS — J810 Acute pulmonary edema: Secondary | ICD-10-CM | POA: Diagnosis not present

## 2018-03-30 LAB — CBC
HCT: 29.7 % — ABNORMAL LOW (ref 39.0–52.0)
HEMOGLOBIN: 10 g/dL — AB (ref 13.0–17.0)
MCH: 29.5 pg (ref 26.0–34.0)
MCHC: 33.7 g/dL (ref 30.0–36.0)
MCV: 87.6 fL (ref 78.0–100.0)
PLATELETS: 157 10*3/uL (ref 150–400)
RBC: 3.39 MIL/uL — AB (ref 4.22–5.81)
RDW: 15.2 % (ref 11.5–15.5)
WBC: 6.5 10*3/uL (ref 4.0–10.5)

## 2018-03-30 LAB — COMPREHENSIVE METABOLIC PANEL
ALBUMIN: 3 g/dL — AB (ref 3.5–5.0)
ALK PHOS: 76 U/L (ref 38–126)
ALT: 9 U/L — AB (ref 17–63)
AST: 16 U/L (ref 15–41)
Anion gap: 13 (ref 5–15)
BILIRUBIN TOTAL: 1.2 mg/dL (ref 0.3–1.2)
BUN: 32 mg/dL — ABNORMAL HIGH (ref 6–20)
CALCIUM: 8.9 mg/dL (ref 8.9–10.3)
CO2: 27 mmol/L (ref 22–32)
CREATININE: 5.95 mg/dL — AB (ref 0.61–1.24)
Chloride: 96 mmol/L — ABNORMAL LOW (ref 101–111)
GFR calc Af Amer: 10 mL/min — ABNORMAL LOW (ref >60)
GFR calc non Af Amer: 8 mL/min — ABNORMAL LOW (ref >60)
Glucose, Bld: 79 mg/dL (ref 65–99)
Potassium: 4 mmol/L (ref 3.5–5.1)
SODIUM: 136 mmol/L (ref 135–145)
TOTAL PROTEIN: 6.6 g/dL (ref 6.5–8.1)

## 2018-03-30 LAB — MRSA PCR SCREENING: MRSA by PCR: NEGATIVE

## 2018-03-30 LAB — IRON AND TIBC
IRON: 46 ug/dL (ref 45–182)
Saturation Ratios: 24 % (ref 17.9–39.5)
TIBC: 193 ug/dL — AB (ref 250–450)
UIBC: 147 ug/dL

## 2018-03-30 LAB — RETICULOCYTES
RBC.: 3.39 MIL/uL — ABNORMAL LOW (ref 4.22–5.81)
Retic Count, Absolute: 57.6 10*3/uL (ref 19.0–186.0)
Retic Ct Pct: 1.7 % (ref 0.4–3.1)

## 2018-03-30 LAB — VITAMIN B12: VITAMIN B 12: 360 pg/mL (ref 180–914)

## 2018-03-30 LAB — FOLATE: Folate: 21 ng/mL (ref >5.9)

## 2018-03-30 LAB — GLUCOSE, CAPILLARY: Glucose-Capillary: 74 mg/dL (ref 65–99)

## 2018-03-30 LAB — FERRITIN: FERRITIN: 1290 ng/mL — AB (ref 24–336)

## 2018-03-30 MED ORDER — HYDRALAZINE HCL 50 MG PO TABS: 50.0000 mg | ORAL_TABLET | Freq: Three times a day (TID) | ORAL | Status: DC

## 2018-03-30 NOTE — Care Management Note (Signed)
Case Management Note [name] RN, BSN Unit 4E-Case Manager [phone]  Patient Details  Name: Trevor [name] MRN: [id number] Date of Birth: 1946-10-01  Subjective/Objective:   Pt admitted with acute pulm. Edema- emergent HD                 Action/Plan: PTA pt from [name]- [facility] following for return to [name] when medically stable.   Expected Discharge Date:                  Expected Discharge Plan:  Skilled Nursing Facility  In-House Referral:  Clinical Social Work  Discharge planning Services  CM Consult  Post Acute Care Choice:    Choice offered to:     DME Arranged:    DME Agency:     HH Arranged:    [place] Agency:     Status of Service:  In process, will continue to follow  If discussed at Long Length of Stay Meetings, dates discussed:    Discharge Disposition: skilled facility   Additional Comments:  [name], RN 03/30/2018, 10:33 AM

## 2018-03-30 NOTE — Discharge Instructions (Signed)
Pulmonary Edema °Pulmonary edema is abnormal fluid buildup in the lungs that can make it hard to breathe. °Follow these instructions at home: °· Talk to your doctor about an exercise program. °· Eat a healthy diet: °? Eat fresh fruits, vegetables, and lean meats. °? Limit high fat and salty foods. °? Avoid processed, canned, or fried foods. °? Avoid fast food. °· Follow your doctor's advice about taking medicine and recording the medicine you take. °· Follow your doctor's advice about keeping a record of your weight. °· Talk to your doctor about keeping track of your blood pressure. °· Do not smoke. °· Do not use nicotine patches or nicotine gum. °· Make a follow-up appointment with your doctor. °· Ask your doctor for a copy of your latest heart tracing (ECG) and keep a copy with you at all times. °Get help right away if: °· You have chest pain. THIS IS AN EMERGENCY. Do not wait to see if the pain will go away. Call for local emergency medical help. Do not drive yourself to the hospital. °· You have sweating, feel sick to your stomach (nauseous), or are experiencing shortness of breath. °· Your weight increases more than your doctor tells you it should. °· You start to have shortness of breath. °· You notice more swelling in your hands, feet, ankles, or belly. °· You have dizziness, blurred vision, headache, or unsteadiness that does not go away. °· You cough up bloody spit. °· You have a cough that does not go away. °· You are unable to sleep because it is hard to breathe. °· You begin to feel a “jumping” or “fluttering” sensation (palpitations) in the chest that is unusual for you. °This information is not intended to replace advice given to you by your health care provider. Make sure you discuss any questions you have with your health care provider. °Document Released: 11/05/2009 Document Revised: 04/24/2016 Document Reviewed: 07/25/2013 °Elsevier Interactive Patient Education © 2018 Elsevier Inc. ° °

## 2018-03-30 NOTE — Progress Notes (Signed)
[facility] Progress Note  Subjective: no c/o, no sob or cough  Vitals:   03/29/18 2104 03/30/18 0037 03/30/18 0302 03/30/18 0743  BP:  (!) 165/96 (!) 176/79 (!) 174/88  Pulse:  (!) 111 (!) 106   Resp:  13 12 16   Temp:  99 F (37.2 C) 97.6 F (36.4 C) 98.2 F (36.8 C)  TempSrc:  Oral Oral Oral  SpO2:  98% 95% 97%  Weight: 59 kg (130 lb 1.1 oz)  58.2 kg (128 lb 4.8 oz)   Height: 5\' 10"  (1.778 m)       Inpatient medications: . atorvastatin  40 mg Oral q1800  . cinacalcet  90 mg Oral Q supper  . docusate sodium  200 mg Oral QHS  . doxercalciferol  3 mcg Intravenous Q M,W,F-HD  . feeding supplement (NEPRO CARB STEADY)  237 mL Oral BID BM  . feeding supplement (PRO-STAT SUGAR FREE 64)  30 mL Oral BID BM  . ferric citrate  630 mg Oral TID WC  . gabapentin  200 mg Oral QHS  . hydrALAZINE  50 mg Oral TID  . isosorbide mononitrate  30 mg Oral Daily  . metoprolol tartrate  25 mg Oral BID  . multivitamin  1 tablet Oral QHS  . omega-3 acid ethyl esters  1 g Oral BID  . senna  1 tablet Oral Daily  . sodium chloride flush  3 mL Intravenous Q12H    acetaminophen **OR** acetaminophen, hydrALAZINE, HYDROcodone-acetaminophen, ondansetron **OR** ondansetron (ZOFRAN) IV  Exam: General: Thin elderly male on nasal oxygen NAD Head: NCAT sclera not icteric  Neck:  No JVD No masses Lungs: Grossly CTAB  Heart: RRR with S1 S2 Abdomen: soft NT + BS Lower extremities: Muscle wasting, trace LE edema, no open wounds  Neuro: A & O  X 3. Moves all extremities spontaneously. Psych:  Responds to questions appropriately with a normal affect. Dialysis Access: LUE AVF cannulated on HD     [facility]  58kg p4  2/2 bath  Heparin none  Left avf -hect 3 ug -mircera 75 ug every 2 wks last 4/24      Impression: 1  Dyspnea/ pulm edema - resolved w/ HD, on room air prob losing body wt and will need lower dry wt 56kg upon dc 2  Esrd on hd mwf 3  HTN cont meds 4  COPD 5   NICM EF 45-50% 6  Hx CVA 7  PAD  Plan - ok for dc will lower dry wt in op setting   Kelly Splinter MD [facility] pager [phone]   03/30/2018, 11:55 AM   Recent Labs  Lab 03/29/18 0146 03/29/18 1031 03/30/18 0343  NA 133*  --  136  K 5.8* 4.0 4.0  CL 95*  --  96*  CO2 22  --  27  GLUCOSE 218*  --  79  BUN 50*  --  32*  CREATININE 9.29*  --  5.95*  CALCIUM 9.3  --  8.9   Recent Labs  Lab 03/30/18 0343  AST 16  ALT 9*  ALKPHOS 76  BILITOT 1.2  PROT 6.6  ALBUMIN 3.0*   Recent Labs  Lab 03/29/18 0146 03/29/18 1031 03/30/18 0343  WBC 11.4*  --  6.5  NEUTROABS 7.9*  --   --   HGB 11.7* 10.5* 10.0*  HCT 35.8* 31.2* 29.7*  MCV 88.8  --  87.6  PLT 201  --  157   Iron/TIBC/Ferritin/ %Sat  Component Value Date/Time   IRON 46 03/30/2018 0343   TIBC 193 (L) 03/30/2018 0343   FERRITIN 1,290 (H) 03/30/2018 0343   IRONPCTSAT 24 03/30/2018 0343

## 2018-03-30 NOTE — Progress Notes (Signed)
Report called to [employer]. All questions answered.

## 2018-03-30 NOTE — Progress Notes (Signed)
Clinical Social Worker facilitated patient discharge including contacting patient family and facility to confirm patient discharge plans.  Clinical information faxed to facility and family agreeable with plan.  CSW arranged ambulance transport via [facility] ([facility] to [facility]  .  RN to call [phone] for report prior to discharge.  Clinical Social Worker will sign off for now as social work intervention is no longer needed. Please consult [place] again if new need arises.  Rhea Pink, MSW, [place]

## 2018-03-30 NOTE — Discharge Summary (Signed)
DISCHARGE SUMMARY  Trevor [name]  MR#: [id number]  DOB:01/26/1946  Date of Admission: 03/29/2018 Date of Discharge: 03/30/2018  Attending Physician:Leocadia Idleman Hennie Duos, MD   Patient's WFU:Trevor [name], Trevor [name]  Consults: Nephrology  Disposition: D/C back to [name]   Follow-up Appts: Follow-up Information    [facility] Follow up in 7 day(s).   Contact information: [place] [address] [phone]           Tests Needing Follow-up: -assess BP control -follow Hgb  -assure pt is up to date on screening colonoscopy   Discharge Diagnoses: Acute pulmonary edema Acute hypoxic respiratory failure ESRD on HD M/W/F [place] Hyperkalemia CAD CVA Chronic Anemia Hypertension  Initial presentation: 72 y.o.malewith a hx of ESRD on hemodialysis, chronic combined systolic/diastolic CHF, hypertension, CVA, and CAD who presented from his [name] w/ shortness of breath. Patient was last dialyzed on 03/26/2018. He had been in his usual state until the day before when he noted shortness of breath. He acutely worsened overnight and EMS was called. He was saturating 77% on room air upon EMS arrival and was transported to the ED with CPAP.  In the ED a CXR was concerning for diffuse interstitial edema.   Hospital Course:  Acute pulmonary edema - acute hypoxic respiratory failure Resolved w/ extra HD session  ESRD on HD M/W/F Care provided by Nephrology   [place] Hyperkalemia Corrected w/ HD   CAD Asymptomatic   CVA No acute deficits identified  Anemia; occult GI bleeding Hgb 11.7 on admission, up from 9.6 in February-FOBT was positive in ED, but clinically there was no evidence of signif bleeding - resume ASA and Plavix at time of d/c - anemia panel most c/w ACD likely due to CKD, but also reflective of Fe deficiency - follow Hgb as outpt at HD - assure pt is up to date on screening colonoscopy    Hypertension BP not at goal - adjust tx - f/u as outpt during HD visits   Allergies as of 03/30/2018   No Known Allergies     Medication List    TAKE these medications   amLODipine 10 MG tablet Commonly known as:  NORVASC Take 10 mg by mouth at bedtime.   aspirin 81 MG chewable tablet Chew 1 tablet (81 mg total) by mouth daily.   atorvastatin 40 MG tablet Commonly known as:  LIPITOR Take 40 mg by mouth at bedtime.   bisacodyl 10 MG suppository Commonly known as:  DULCOLAX Place 10 mg rectally daily as needed (constipation not relieved by MOM).   clopidogrel 75 MG tablet Commonly known as:  PLAVIX Take 1 tablet (75 mg total) by mouth daily.   docusate sodium 100 MG capsule Commonly known as:  COLACE Take 200 mg by mouth at bedtime.   doxercalciferol 4 MCG/2ML injection Commonly known as:  HECTOROL Inject 1 mL (2 mcg total) into the vein every Monday, Wednesday, and Friday with hemodialysis.   ENSURE PLUS Liqd Take 237 mLs by mouth 3 (three) times daily between meals.   feeding supplement (NEPRO CARB STEADY) Liqd Take 237 mLs by mouth 2 (two) times daily between meals.   feeding supplement (PRO-STAT SUGAR FREE 64) Liqd Take 30 mLs by mouth 2 (two) times daily.   ferric citrate 1 GM 210 MG(Fe) tablet Commonly known as:  AURYXIA Take 630 mg by mouth 3 (three) times daily with meals.   Fish Oil 1000 MG Caps Take 1,000 mg by mouth 2 (two) times daily.  fluticasone 50 MCG/ACT nasal spray Commonly known as:  FLONASE Place 1 spray into both nostrils 2 (two) times daily.   gabapentin 300 MG capsule Commonly known as:  NEURONTIN Take 1 capsule (300 mg total) by mouth at bedtime.   guaiFENesin 100 MG/5ML Soln Commonly known as:  ROBITUSSIN Take 10 mLs by mouth every 4 (four) hours as needed for cough or to loosen phlegm.   hydrALAZINE 25 MG tablet Commonly known as:  APRESOLINE Take 25 mg by mouth 3 (three) times daily.   isosorbide mononitrate 30 MG 24  hr tablet Commonly known as:  IMDUR Take 30 mg by mouth daily.   metoprolol tartrate 25 MG tablet Commonly known as:  LOPRESSOR Take 1 tablet (25 mg total) by mouth 2 (two) times daily.   multivitamin Tabs tablet Take 1 tablet by mouth at bedtime.   omega-3 acid ethyl esters 1 g capsule Commonly known as:  LOVAZA Take 1 g by mouth 2 (two) times daily.   phenol 1.4 % Liqd Commonly known as:  CHLORASEPTIC Use as directed 2 sprays in the mouth or throat every 8 (eight) hours as needed for throat irritation / pain.   senna 8.6 MG Tabs tablet Commonly known as:  SENOKOT Take 1 tablet by mouth daily.       Day of Discharge BP (!) 174/88 (BP Location: Right Arm)   Pulse (!) 106   Temp 98.2 F (36.8 C) (Oral)   Resp 16   Ht 5\' 10"  (1.778 m)   Wt 58.2 kg (128 lb 4.8 oz)   SpO2 97%   BMI 18.41 kg/m   Physical Exam: General: No acute respiratory distress Lungs: Clear to auscultation bilaterally without wheezes or crackles Cardiovascular: Regular rate and rhythm without murmur gallop or rub normal S1 and S2 Abdomen: Nontender, nondistended, soft, bowel sounds positive, no rebound, no ascites, no appreciable mass Extremities: No significant cyanosis, clubbing, or edema bilateral lower extremities  Basic Metabolic Panel: Recent Labs  Lab 03/29/18 0146 03/29/18 1031 03/30/18 0343  NA 133*  --  136  K 5.8* 4.0 4.0  CL 95*  --  96*  CO2 22  --  27  GLUCOSE 218*  --  79  BUN 50*  --  32*  CREATININE 9.29*  --  5.95*  CALCIUM 9.3  --  8.9    Liver Function Tests: Recent Labs  Lab 03/30/18 0343  AST 16  ALT 9*  ALKPHOS 76  BILITOT 1.2  PROT 6.6  ALBUMIN 3.0*    CBC: Recent Labs  Lab 03/29/18 0146 03/29/18 1031 03/30/18 0343  WBC 11.4*  --  6.5  NEUTROABS 7.9*  --   --   HGB 11.7* 10.5* 10.0*  HCT 35.8* 31.2* 29.7*  MCV 88.8  --  87.6  PLT 201  --  157    Cardiac Enzymes: Recent Labs  Lab 03/29/18 1031  TROPONINI 0.05*   BNP (last 3  results) Recent Labs    06/27/17 0550 01/12/18 0515 03/29/18 0146  BNP >4,500.0* 4,136.0* >4,500.0*    CBG: Recent Labs  Lab 03/29/18 0134 03/30/18 0552  GLUCAP 192* 74    Recent Results (from the past 240 hour(s))  MRSA PCR Screening     Status: None   Collection Time: 03/29/18  9:23 PM  Result Value Ref Range Status   MRSA by PCR NEGATIVE NEGATIVE Final    Comment:        The GeneXpert MRSA Assay (FDA approved for NASAL specimens  only), is one component of a comprehensive MRSA colonization surveillance program. It is not intended to diagnose MRSA infection nor to guide or monitor treatment for MRSA infections. Performed at [facility], [place] [address], [place], [place] [address]      Time spent in discharge (includes decision making & examination of pt): 30 minutes  03/30/2018, 11:33 AM   [name], MD [facility] Office  [phone] Pager [phone]  On-Call/Text Page:      [email or web address]      password [facility]

## 2018-03-30 NOTE — Clinical Social Work Note (Signed)
Clinical Social Work Assessment  Patient Details  Name: Trevor [name] MRN: [id number] Date of Birth: [birth year]  Date of referral:  03/30/18               Reason for consult:  Discharge Planning                Permission sought to share information with:  Family Supports Permission granted to share information::  Yes, Verbal Permission Granted  Name::     Coral Spikes  Agency::  [facility]   Relationship::  son  Contact Information:  [phone]  Housing/Transportation Living arrangements for the past 2 months:  [place] of Information:  Patient Patient Interpreter Needed:  None Criminal Activity/Legal Involvement Pertinent to Current Situation/Hospitalization:  No - Comment as needed Significant Relationships:  Adult Children, Siblings, Community Support Lives with:  Facility Resident Do you feel safe going back to the place where you live?  Yes Need for family participation in patient care:  Yes (Comment)  Care giving concerns:  No family at bedside. Patient had admission coordinator (Tammy) from [facility] at bedside   Social Worker assessment / plan:  Patient stated he has bed at [facility] for 2-3 months and would like to return back to facility. Patient stated he was told by medical team it would possibly be today. Patient is able to return to facility once stable Employment status:  Retired Forensic scientist:  Medicare PT Recommendations:  [place] / Referral to community resources:  [place]  Patient/Family's Response to care:  Patient appreciative of care he has received during his stay  Patient/Family's Understanding of and Emotional Response to Diagnosis, Current Treatment, and Prognosis: Patient agreeable to go back to snf  Emotional Assessment Appearance:  Appears older than stated age Attitude/Demeanor/Rapport:  Engaged Affect (typically observed):  Accepting Orientation:   Oriented to Self, Oriented to Situation, Oriented to Place, Oriented to  Time Alcohol / Substance use:  Not Applicable Psych involvement (Current and /or in the community):  No (Comment)  Discharge Needs  Concerns to be addressed:  No discharge needs identified Readmission within the last 30 days:  No Current discharge risk:  None Barriers to Discharge:  No Barriers Identified   Wende Neighbors, LCSW 03/30/2018, 11:52 AM

## 2018-03-31 DIAGNOSIS — J810 Acute pulmonary edema: Secondary | ICD-10-CM | POA: Diagnosis not present

## 2018-03-31 DIAGNOSIS — R2689 Other abnormalities of gait and mobility: Secondary | ICD-10-CM | POA: Diagnosis not present

## 2018-03-31 DIAGNOSIS — I5042 Chronic combined systolic (congestive) and diastolic (congestive) heart failure: Secondary | ICD-10-CM | POA: Diagnosis not present

## 2018-03-31 DIAGNOSIS — R488 Other symbolic dysfunctions: Secondary | ICD-10-CM | POA: Diagnosis not present

## 2018-03-31 DIAGNOSIS — G629 Polyneuropathy, unspecified: Secondary | ICD-10-CM | POA: Diagnosis not present

## 2018-03-31 DIAGNOSIS — N186 End stage renal disease: Secondary | ICD-10-CM | POA: Diagnosis not present

## 2018-04-01 DIAGNOSIS — R488 Other symbolic dysfunctions: Secondary | ICD-10-CM | POA: Diagnosis not present

## 2018-04-01 DIAGNOSIS — R2689 Other abnormalities of gait and mobility: Secondary | ICD-10-CM | POA: Diagnosis not present

## 2018-04-01 DIAGNOSIS — J810 Acute pulmonary edema: Secondary | ICD-10-CM | POA: Diagnosis not present

## 2018-04-01 DIAGNOSIS — N186 End stage renal disease: Secondary | ICD-10-CM | POA: Diagnosis not present

## 2018-04-01 DIAGNOSIS — G629 Polyneuropathy, unspecified: Secondary | ICD-10-CM | POA: Diagnosis not present

## 2018-04-01 DIAGNOSIS — I5042 Chronic combined systolic (congestive) and diastolic (congestive) heart failure: Secondary | ICD-10-CM | POA: Diagnosis not present

## 2018-04-02 DIAGNOSIS — I5042 Chronic combined systolic (congestive) and diastolic (congestive) heart failure: Secondary | ICD-10-CM | POA: Diagnosis not present

## 2018-04-02 DIAGNOSIS — R488 Other symbolic dysfunctions: Secondary | ICD-10-CM | POA: Diagnosis not present

## 2018-04-02 DIAGNOSIS — G629 Polyneuropathy, unspecified: Secondary | ICD-10-CM | POA: Diagnosis not present

## 2018-04-02 DIAGNOSIS — R2689 Other abnormalities of gait and mobility: Secondary | ICD-10-CM | POA: Diagnosis not present

## 2018-04-02 DIAGNOSIS — J810 Acute pulmonary edema: Secondary | ICD-10-CM | POA: Diagnosis not present

## 2018-04-02 DIAGNOSIS — N186 End stage renal disease: Secondary | ICD-10-CM | POA: Diagnosis not present

## 2018-04-04 DIAGNOSIS — N186 End stage renal disease: Secondary | ICD-10-CM | POA: Diagnosis not present

## 2018-04-04 DIAGNOSIS — G629 Polyneuropathy, unspecified: Secondary | ICD-10-CM | POA: Diagnosis not present

## 2018-04-04 DIAGNOSIS — J810 Acute pulmonary edema: Secondary | ICD-10-CM | POA: Diagnosis not present

## 2018-04-04 DIAGNOSIS — I5042 Chronic combined systolic (congestive) and diastolic (congestive) heart failure: Secondary | ICD-10-CM | POA: Diagnosis not present

## 2018-04-04 DIAGNOSIS — R488 Other symbolic dysfunctions: Secondary | ICD-10-CM | POA: Diagnosis not present

## 2018-04-04 DIAGNOSIS — R2689 Other abnormalities of gait and mobility: Secondary | ICD-10-CM | POA: Diagnosis not present

## 2018-04-05 DIAGNOSIS — N186 End stage renal disease: Secondary | ICD-10-CM | POA: Diagnosis not present

## 2018-04-05 DIAGNOSIS — I5042 Chronic combined systolic (congestive) and diastolic (congestive) heart failure: Secondary | ICD-10-CM | POA: Diagnosis not present

## 2018-04-05 DIAGNOSIS — G629 Polyneuropathy, unspecified: Secondary | ICD-10-CM | POA: Diagnosis not present

## 2018-04-05 DIAGNOSIS — R2689 Other abnormalities of gait and mobility: Secondary | ICD-10-CM | POA: Diagnosis not present

## 2018-04-05 DIAGNOSIS — J810 Acute pulmonary edema: Secondary | ICD-10-CM | POA: Diagnosis not present

## 2018-04-05 DIAGNOSIS — R488 Other symbolic dysfunctions: Secondary | ICD-10-CM | POA: Diagnosis not present

## 2018-04-06 DIAGNOSIS — I5042 Chronic combined systolic (congestive) and diastolic (congestive) heart failure: Secondary | ICD-10-CM | POA: Diagnosis not present

## 2018-04-06 DIAGNOSIS — N186 End stage renal disease: Secondary | ICD-10-CM | POA: Diagnosis not present

## 2018-04-06 DIAGNOSIS — J810 Acute pulmonary edema: Secondary | ICD-10-CM | POA: Diagnosis not present

## 2018-04-06 DIAGNOSIS — R488 Other symbolic dysfunctions: Secondary | ICD-10-CM | POA: Diagnosis not present

## 2018-04-06 DIAGNOSIS — R2689 Other abnormalities of gait and mobility: Secondary | ICD-10-CM | POA: Diagnosis not present

## 2018-04-06 DIAGNOSIS — G629 Polyneuropathy, unspecified: Secondary | ICD-10-CM | POA: Diagnosis not present

## 2018-04-07 DIAGNOSIS — R2689 Other abnormalities of gait and mobility: Secondary | ICD-10-CM | POA: Diagnosis not present

## 2018-04-07 DIAGNOSIS — R488 Other symbolic dysfunctions: Secondary | ICD-10-CM | POA: Diagnosis not present

## 2018-04-07 DIAGNOSIS — D631 Anemia in chronic kidney disease: Secondary | ICD-10-CM | POA: Diagnosis not present

## 2018-04-07 DIAGNOSIS — J810 Acute pulmonary edema: Secondary | ICD-10-CM | POA: Diagnosis not present

## 2018-04-07 DIAGNOSIS — I10 Essential (primary) hypertension: Secondary | ICD-10-CM | POA: Diagnosis not present

## 2018-04-07 DIAGNOSIS — I5042 Chronic combined systolic (congestive) and diastolic (congestive) heart failure: Secondary | ICD-10-CM | POA: Diagnosis not present

## 2018-04-07 DIAGNOSIS — G629 Polyneuropathy, unspecified: Secondary | ICD-10-CM | POA: Diagnosis not present

## 2018-04-07 DIAGNOSIS — N186 End stage renal disease: Secondary | ICD-10-CM | POA: Diagnosis not present

## 2018-04-07 DIAGNOSIS — I639 Cerebral infarction, unspecified: Secondary | ICD-10-CM | POA: Diagnosis not present

## 2018-04-08 DIAGNOSIS — R488 Other symbolic dysfunctions: Secondary | ICD-10-CM | POA: Diagnosis not present

## 2018-04-08 DIAGNOSIS — G629 Polyneuropathy, unspecified: Secondary | ICD-10-CM | POA: Diagnosis not present

## 2018-04-08 DIAGNOSIS — I10 Essential (primary) hypertension: Secondary | ICD-10-CM | POA: Diagnosis not present

## 2018-04-08 DIAGNOSIS — R2689 Other abnormalities of gait and mobility: Secondary | ICD-10-CM | POA: Diagnosis not present

## 2018-04-08 DIAGNOSIS — N186 End stage renal disease: Secondary | ICD-10-CM | POA: Diagnosis not present

## 2018-04-08 DIAGNOSIS — I5042 Chronic combined systolic (congestive) and diastolic (congestive) heart failure: Secondary | ICD-10-CM | POA: Diagnosis not present

## 2018-04-08 DIAGNOSIS — D649 Anemia, unspecified: Secondary | ICD-10-CM | POA: Diagnosis not present

## 2018-04-08 DIAGNOSIS — R195 Other fecal abnormalities: Secondary | ICD-10-CM | POA: Diagnosis not present

## 2018-04-08 DIAGNOSIS — J9601 Acute respiratory failure with hypoxia: Secondary | ICD-10-CM | POA: Diagnosis not present

## 2018-04-08 DIAGNOSIS — Z79899 Other long term (current) drug therapy: Secondary | ICD-10-CM | POA: Diagnosis not present

## 2018-04-08 DIAGNOSIS — J810 Acute pulmonary edema: Secondary | ICD-10-CM | POA: Diagnosis not present

## 2018-04-11 DIAGNOSIS — R488 Other symbolic dysfunctions: Secondary | ICD-10-CM | POA: Diagnosis not present

## 2018-04-11 DIAGNOSIS — I5042 Chronic combined systolic (congestive) and diastolic (congestive) heart failure: Secondary | ICD-10-CM | POA: Diagnosis not present

## 2018-04-11 DIAGNOSIS — N186 End stage renal disease: Secondary | ICD-10-CM | POA: Diagnosis not present

## 2018-04-11 DIAGNOSIS — J810 Acute pulmonary edema: Secondary | ICD-10-CM | POA: Diagnosis not present

## 2018-04-11 DIAGNOSIS — G629 Polyneuropathy, unspecified: Secondary | ICD-10-CM | POA: Diagnosis not present

## 2018-04-11 DIAGNOSIS — R2689 Other abnormalities of gait and mobility: Secondary | ICD-10-CM | POA: Diagnosis not present

## 2018-04-12 DIAGNOSIS — R2689 Other abnormalities of gait and mobility: Secondary | ICD-10-CM | POA: Diagnosis not present

## 2018-04-12 DIAGNOSIS — M79662 Pain in left lower leg: Secondary | ICD-10-CM | POA: Diagnosis not present

## 2018-04-12 DIAGNOSIS — I5042 Chronic combined systolic (congestive) and diastolic (congestive) heart failure: Secondary | ICD-10-CM | POA: Diagnosis not present

## 2018-04-12 DIAGNOSIS — N186 End stage renal disease: Secondary | ICD-10-CM | POA: Diagnosis not present

## 2018-04-12 DIAGNOSIS — J810 Acute pulmonary edema: Secondary | ICD-10-CM | POA: Diagnosis not present

## 2018-04-12 DIAGNOSIS — G629 Polyneuropathy, unspecified: Secondary | ICD-10-CM | POA: Diagnosis not present

## 2018-04-12 DIAGNOSIS — R488 Other symbolic dysfunctions: Secondary | ICD-10-CM | POA: Diagnosis not present

## 2018-04-12 DIAGNOSIS — M25572 Pain in left ankle and joints of left foot: Secondary | ICD-10-CM | POA: Diagnosis not present

## 2018-04-13 DIAGNOSIS — I5042 Chronic combined systolic (congestive) and diastolic (congestive) heart failure: Secondary | ICD-10-CM | POA: Diagnosis not present

## 2018-04-13 DIAGNOSIS — N186 End stage renal disease: Secondary | ICD-10-CM | POA: Diagnosis not present

## 2018-04-13 DIAGNOSIS — G629 Polyneuropathy, unspecified: Secondary | ICD-10-CM | POA: Diagnosis not present

## 2018-04-13 DIAGNOSIS — J810 Acute pulmonary edema: Secondary | ICD-10-CM | POA: Diagnosis not present

## 2018-04-13 DIAGNOSIS — R488 Other symbolic dysfunctions: Secondary | ICD-10-CM | POA: Diagnosis not present

## 2018-04-13 DIAGNOSIS — R2689 Other abnormalities of gait and mobility: Secondary | ICD-10-CM | POA: Diagnosis not present

## 2018-04-14 DIAGNOSIS — G629 Polyneuropathy, unspecified: Secondary | ICD-10-CM | POA: Diagnosis not present

## 2018-04-14 DIAGNOSIS — I5042 Chronic combined systolic (congestive) and diastolic (congestive) heart failure: Secondary | ICD-10-CM | POA: Diagnosis not present

## 2018-04-14 DIAGNOSIS — N186 End stage renal disease: Secondary | ICD-10-CM | POA: Diagnosis not present

## 2018-04-14 DIAGNOSIS — R488 Other symbolic dysfunctions: Secondary | ICD-10-CM | POA: Diagnosis not present

## 2018-04-14 DIAGNOSIS — J810 Acute pulmonary edema: Secondary | ICD-10-CM | POA: Diagnosis not present

## 2018-04-14 DIAGNOSIS — R2689 Other abnormalities of gait and mobility: Secondary | ICD-10-CM | POA: Diagnosis not present

## 2018-04-15 DIAGNOSIS — G629 Polyneuropathy, unspecified: Secondary | ICD-10-CM | POA: Diagnosis not present

## 2018-04-15 DIAGNOSIS — J810 Acute pulmonary edema: Secondary | ICD-10-CM | POA: Diagnosis not present

## 2018-04-15 DIAGNOSIS — N186 End stage renal disease: Secondary | ICD-10-CM | POA: Diagnosis not present

## 2018-04-15 DIAGNOSIS — R488 Other symbolic dysfunctions: Secondary | ICD-10-CM | POA: Diagnosis not present

## 2018-04-15 DIAGNOSIS — R2689 Other abnormalities of gait and mobility: Secondary | ICD-10-CM | POA: Diagnosis not present

## 2018-04-15 DIAGNOSIS — I5042 Chronic combined systolic (congestive) and diastolic (congestive) heart failure: Secondary | ICD-10-CM | POA: Diagnosis not present

## 2018-04-16 DIAGNOSIS — R488 Other symbolic dysfunctions: Secondary | ICD-10-CM | POA: Diagnosis not present

## 2018-04-16 DIAGNOSIS — J810 Acute pulmonary edema: Secondary | ICD-10-CM | POA: Diagnosis not present

## 2018-04-16 DIAGNOSIS — R2689 Other abnormalities of gait and mobility: Secondary | ICD-10-CM | POA: Diagnosis not present

## 2018-04-16 DIAGNOSIS — N186 End stage renal disease: Secondary | ICD-10-CM | POA: Diagnosis not present

## 2018-04-16 DIAGNOSIS — I5042 Chronic combined systolic (congestive) and diastolic (congestive) heart failure: Secondary | ICD-10-CM | POA: Diagnosis not present

## 2018-04-16 DIAGNOSIS — G629 Polyneuropathy, unspecified: Secondary | ICD-10-CM | POA: Diagnosis not present

## 2018-04-18 DIAGNOSIS — N186 End stage renal disease: Secondary | ICD-10-CM | POA: Diagnosis not present

## 2018-04-18 DIAGNOSIS — R2689 Other abnormalities of gait and mobility: Secondary | ICD-10-CM | POA: Diagnosis not present

## 2018-04-18 DIAGNOSIS — J810 Acute pulmonary edema: Secondary | ICD-10-CM | POA: Diagnosis not present

## 2018-04-18 DIAGNOSIS — R488 Other symbolic dysfunctions: Secondary | ICD-10-CM | POA: Diagnosis not present

## 2018-04-18 DIAGNOSIS — G629 Polyneuropathy, unspecified: Secondary | ICD-10-CM | POA: Diagnosis not present

## 2018-04-18 DIAGNOSIS — I5042 Chronic combined systolic (congestive) and diastolic (congestive) heart failure: Secondary | ICD-10-CM | POA: Diagnosis not present

## 2018-04-19 ENCOUNTER — Emergency Department (HOSPITAL_COMMUNITY): Payer: Medicare Other

## 2018-04-19 ENCOUNTER — Inpatient Hospital Stay (HOSPITAL_COMMUNITY)
Admission: EM | Admit: 2018-04-19 | Discharge: 2018-04-20 | DRG: 189 | Disposition: A | Discharge status: Skilled Nursing Facility | Payer: Medicare Other | Attending: Family Medicine | Admitting: Family Medicine

## 2018-04-19 ENCOUNTER — Other Ambulatory Visit: Payer: Self-pay

## 2018-04-19 ENCOUNTER — Encounter (HOSPITAL_COMMUNITY): Payer: Self-pay | Admitting: *Deleted

## 2018-04-19 DIAGNOSIS — I248 Other forms of acute ischemic heart disease: Secondary | ICD-10-CM | POA: Diagnosis present

## 2018-04-19 DIAGNOSIS — Z992 Dependence on renal dialysis: Secondary | ICD-10-CM | POA: Diagnosis not present

## 2018-04-19 DIAGNOSIS — J9601 Acute respiratory failure with hypoxia: Secondary | ICD-10-CM | POA: Diagnosis present

## 2018-04-19 DIAGNOSIS — J810 Acute pulmonary edema: Secondary | ICD-10-CM | POA: Diagnosis present

## 2018-04-19 DIAGNOSIS — R279 Unspecified lack of coordination: Secondary | ICD-10-CM | POA: Diagnosis not present

## 2018-04-19 DIAGNOSIS — Z7951 Long term (current) use of inhaled steroids: Secondary | ICD-10-CM

## 2018-04-19 DIAGNOSIS — R2689 Other abnormalities of gait and mobility: Secondary | ICD-10-CM | POA: Diagnosis not present

## 2018-04-19 DIAGNOSIS — I429 Cardiomyopathy, unspecified: Secondary | ICD-10-CM | POA: Diagnosis not present

## 2018-04-19 DIAGNOSIS — Z9115 Patient's noncompliance with renal dialysis: Secondary | ICD-10-CM

## 2018-04-19 DIAGNOSIS — I10 Essential (primary) hypertension: Secondary | ICD-10-CM | POA: Diagnosis not present

## 2018-04-19 DIAGNOSIS — R079 Chest pain, unspecified: Secondary | ICD-10-CM | POA: Diagnosis not present

## 2018-04-19 DIAGNOSIS — I428 Other cardiomyopathies: Secondary | ICD-10-CM | POA: Diagnosis present

## 2018-04-19 DIAGNOSIS — Z7902 Long term (current) use of antithrombotics/antiplatelets: Secondary | ICD-10-CM | POA: Diagnosis not present

## 2018-04-19 DIAGNOSIS — I5043 Acute on chronic combined systolic (congestive) and diastolic (congestive) heart failure: Secondary | ICD-10-CM | POA: Diagnosis present

## 2018-04-19 DIAGNOSIS — I5023 Acute on chronic systolic (congestive) heart failure: Secondary | ICD-10-CM | POA: Diagnosis not present

## 2018-04-19 DIAGNOSIS — I714 Abdominal aortic aneurysm, without rupture: Secondary | ICD-10-CM | POA: Diagnosis present

## 2018-04-19 DIAGNOSIS — Z8673 Personal history of transient ischemic attack (TIA), and cerebral infarction without residual deficits: Secondary | ICD-10-CM | POA: Diagnosis not present

## 2018-04-19 DIAGNOSIS — I132 Hypertensive heart and chronic kidney disease with heart failure and with stage 5 chronic kidney disease, or end stage renal disease: Secondary | ICD-10-CM | POA: Diagnosis present

## 2018-04-19 DIAGNOSIS — H5461 Unqualified visual loss, right eye, normal vision left eye: Secondary | ICD-10-CM | POA: Diagnosis present

## 2018-04-19 DIAGNOSIS — D631 Anemia in chronic kidney disease: Secondary | ICD-10-CM | POA: Diagnosis present

## 2018-04-19 DIAGNOSIS — R0600 Dyspnea, unspecified: Secondary | ICD-10-CM | POA: Diagnosis not present

## 2018-04-19 DIAGNOSIS — I120 Hypertensive chronic kidney disease with stage 5 chronic kidney disease or end stage renal disease: Secondary | ICD-10-CM | POA: Diagnosis not present

## 2018-04-19 DIAGNOSIS — I5042 Chronic combined systolic (congestive) and diastolic (congestive) heart failure: Secondary | ICD-10-CM | POA: Diagnosis present

## 2018-04-19 DIAGNOSIS — I69312 Visuospatial deficit and spatial neglect following cerebral infarction: Secondary | ICD-10-CM | POA: Diagnosis not present

## 2018-04-19 DIAGNOSIS — N186 End stage renal disease: Secondary | ICD-10-CM

## 2018-04-19 DIAGNOSIS — I2510 Atherosclerotic heart disease of native coronary artery without angina pectoris: Secondary | ICD-10-CM | POA: Diagnosis present

## 2018-04-19 DIAGNOSIS — G629 Polyneuropathy, unspecified: Secondary | ICD-10-CM | POA: Diagnosis not present

## 2018-04-19 DIAGNOSIS — Z8249 Family history of ischemic heart disease and other diseases of the circulatory system: Secondary | ICD-10-CM

## 2018-04-19 DIAGNOSIS — Z95820 Peripheral vascular angioplasty status with implants and grafts: Secondary | ICD-10-CM

## 2018-04-19 DIAGNOSIS — B182 Chronic viral hepatitis C: Secondary | ICD-10-CM | POA: Diagnosis present

## 2018-04-19 DIAGNOSIS — Z7982 Long term (current) use of aspirin: Secondary | ICD-10-CM

## 2018-04-19 DIAGNOSIS — R488 Other symbolic dysfunctions: Secondary | ICD-10-CM | POA: Diagnosis not present

## 2018-04-19 DIAGNOSIS — N2581 Secondary hyperparathyroidism of renal origin: Secondary | ICD-10-CM | POA: Diagnosis present

## 2018-04-19 DIAGNOSIS — E46 Unspecified protein-calorie malnutrition: Secondary | ICD-10-CM | POA: Diagnosis present

## 2018-04-19 DIAGNOSIS — Z743 Need for continuous supervision: Secondary | ICD-10-CM | POA: Diagnosis not present

## 2018-04-19 DIAGNOSIS — Z681 Body mass index (BMI) 19 or less, adult: Secondary | ICD-10-CM | POA: Diagnosis not present

## 2018-04-19 DIAGNOSIS — J410 Simple chronic bronchitis: Secondary | ICD-10-CM | POA: Diagnosis not present

## 2018-04-19 DIAGNOSIS — E875 Hyperkalemia: Secondary | ICD-10-CM | POA: Diagnosis present

## 2018-04-19 DIAGNOSIS — F17210 Nicotine dependence, cigarettes, uncomplicated: Secondary | ICD-10-CM | POA: Diagnosis present

## 2018-04-19 LAB — I-STAT CHEM 8, ED
BUN: 39 mg/dL — AB (ref 6–20)
CHLORIDE: 95 mmol/L — AB (ref 101–111)
CREATININE: 9.6 mg/dL — AB (ref 0.61–1.24)
Calcium, Ion: 1.1 mmol/L — ABNORMAL LOW (ref 1.15–1.40)
Glucose, Bld: 135 mg/dL — ABNORMAL HIGH (ref 65–99)
HEMATOCRIT: 39 % (ref 39.0–52.0)
Hemoglobin: 13.3 g/dL (ref 13.0–17.0)
POTASSIUM: 4.9 mmol/L (ref 3.5–5.1)
SODIUM: 133 mmol/L — AB (ref 135–145)
TCO2: 26 mmol/L (ref 22–32)

## 2018-04-19 LAB — TROPONIN I: TROPONIN I: 0.03 ng/mL — AB (ref <0.03)

## 2018-04-19 LAB — COMPREHENSIVE METABOLIC PANEL
ALBUMIN: 3.3 g/dL — AB (ref 3.5–5.0)
ALT: 10 U/L — ABNORMAL LOW (ref 17–63)
AST: 15 U/L (ref 15–41)
Alkaline Phosphatase: 113 U/L (ref 38–126)
Anion gap: 15 (ref 5–15)
BILIRUBIN TOTAL: 1.2 mg/dL (ref 0.3–1.2)
BUN: 41 mg/dL — AB (ref 6–20)
CHLORIDE: 93 mmol/L — AB (ref 101–111)
CO2: 26 mmol/L (ref 22–32)
Calcium: 9.6 mg/dL (ref 8.9–10.3)
Creatinine, Ser: 10.18 mg/dL — ABNORMAL HIGH (ref 0.61–1.24)
GFR calc Af Amer: 5 mL/min — ABNORMAL LOW (ref >60)
GFR calc non Af Amer: 4 mL/min — ABNORMAL LOW (ref >60)
GLUCOSE: 140 mg/dL — AB (ref 65–99)
POTASSIUM: 5 mmol/L (ref 3.5–5.1)
SODIUM: 134 mmol/L — AB (ref 135–145)
Total Protein: 7.7 g/dL (ref 6.5–8.1)

## 2018-04-19 LAB — CBC WITH DIFFERENTIAL/PLATELET
Abs Immature Granulocytes: 0 10*3/uL (ref 0.0–0.1)
BASOS ABS: 0.1 10*3/uL (ref 0.0–0.1)
Basophils Relative: 1 %
Eosinophils Absolute: 0.3 10*3/uL (ref 0.0–0.7)
Eosinophils Relative: 4 %
HEMATOCRIT: 38.9 % — AB (ref 39.0–52.0)
Hemoglobin: 12.7 g/dL — ABNORMAL LOW (ref 13.0–17.0)
IMMATURE GRANULOCYTES: 0 %
LYMPHS ABS: 1.1 10*3/uL (ref 0.7–4.0)
Lymphocytes Relative: 16 %
MCH: 28.9 pg (ref 26.0–34.0)
MCHC: 32.6 g/dL (ref 30.0–36.0)
MCV: 88.6 fL (ref 78.0–100.0)
Monocytes Absolute: 0.5 10*3/uL (ref 0.1–1.0)
Monocytes Relative: 7 %
NEUTROS PCT: 72 %
Neutro Abs: 5.1 10*3/uL (ref 1.7–7.7)
PLATELETS: 198 10*3/uL (ref 150–400)
RBC: 4.39 MIL/uL (ref 4.22–5.81)
RDW: 14.8 % (ref 11.5–15.5)
WBC: 7.1 10*3/uL (ref 4.0–10.5)

## 2018-04-19 LAB — I-STAT TROPONIN, ED: Troponin i, poc: 0 ng/mL (ref 0.00–0.08)

## 2018-04-19 MED ORDER — BISACODYL 10 MG RE SUPP: 10.0000 mg | Freq: Every day | RECTAL | Status: DC | PRN

## 2018-04-19 MED ORDER — RENA-VITE PO TABS
1.0000 | ORAL_TABLET | Freq: Every day | ORAL | Status: DC
  Filled 2018-04-19: qty 1

## 2018-04-19 MED ORDER — HYDRALAZINE HCL 25 MG PO TABS
25.0000 mg | ORAL_TABLET | Freq: Three times a day (TID) | ORAL | Status: DC
  Administered 2018-04-20: 25 mg via ORAL
  Filled 2018-04-19 (×2): qty 1

## 2018-04-19 MED ORDER — ENSURE ENLIVE PO LIQD: 237.0000 mL | Freq: Three times a day (TID) | ORAL | Status: DC

## 2018-04-19 MED ORDER — CLOPIDOGREL BISULFATE 75 MG PO TABS
75.0000 mg | ORAL_TABLET | Freq: Every day | ORAL | Status: DC
  Administered 2018-04-20: 75 mg via ORAL
  Filled 2018-04-19: qty 1

## 2018-04-19 MED ORDER — DOCUSATE SODIUM 100 MG PO CAPS
200.0000 mg | ORAL_CAPSULE | Freq: Every day | ORAL | Status: DC
Start: 2018-04-19 — End: 2018-04-20
  Filled 2018-04-19: qty 2

## 2018-04-19 MED ORDER — SODIUM CHLORIDE 0.9 % IV SOLN: 100.0000 mL | INTRAVENOUS | Status: DC | PRN

## 2018-04-19 MED ORDER — LIDOCAINE-PRILOCAINE 2.5-2.5 % EX CREA: 1.0000 "application " | TOPICAL_CREAM | CUTANEOUS | Status: DC | PRN

## 2018-04-19 MED ORDER — ONDANSETRON HCL 4 MG/2ML IJ SOLN: 4.0000 mg | Freq: Four times a day (QID) | INTRAMUSCULAR | Status: DC | PRN

## 2018-04-19 MED ORDER — SENNA 8.6 MG PO TABS
1.0000 | ORAL_TABLET | Freq: Every day | ORAL | Status: DC
  Administered 2018-04-20: 8.6 mg via ORAL
  Filled 2018-04-19: qty 1

## 2018-04-19 MED ORDER — ALPRAZOLAM 0.25 MG PO TABS: 0.2500 mg | ORAL_TABLET | Freq: Two times a day (BID) | ORAL | Status: DC | PRN

## 2018-04-19 MED ORDER — ADULT MULTIVITAMIN W/MINERALS CH
1.0000 | ORAL_TABLET | Freq: Every day | ORAL | Status: DC
  Administered 2018-04-20: 1 via ORAL
  Filled 2018-04-19: qty 1

## 2018-04-19 MED ORDER — GABAPENTIN 300 MG PO CAPS
300.0000 mg | ORAL_CAPSULE | Freq: Every day | ORAL | Status: DC
  Filled 2018-04-19: qty 1

## 2018-04-19 MED ORDER — ASPIRIN 81 MG PO CHEW
81.0000 mg | CHEWABLE_TABLET | Freq: Every day | ORAL | Status: DC
  Administered 2018-04-20: 81 mg via ORAL
  Filled 2018-04-19: qty 1

## 2018-04-19 MED ORDER — PENTAFLUOROPROP-TETRAFLUOROETH EX AERO: 1.0000 "application " | INHALATION_SPRAY | CUTANEOUS | Status: DC | PRN

## 2018-04-19 MED ORDER — ATORVASTATIN CALCIUM 40 MG PO TABS: 40.0000 mg | ORAL_TABLET | Freq: Every day | ORAL | Status: DC

## 2018-04-19 MED ORDER — CINACALCET HCL 30 MG PO TABS
90.0000 mg | ORAL_TABLET | Freq: Every day | ORAL | Status: DC
  Filled 2018-04-19: qty 3

## 2018-04-19 MED ORDER — MORPHINE SULFATE (PF) 4 MG/ML IV SOLN: 1.0000 mg | INTRAVENOUS | Status: DC | PRN

## 2018-04-19 MED ORDER — IPRATROPIUM-ALBUTEROL 0.5-2.5 (3) MG/3ML IN SOLN
3.0000 mL | Freq: Once | RESPIRATORY_TRACT | Status: AC
  Administered 2018-04-19: 3 mL via RESPIRATORY_TRACT
  Filled 2018-04-19: qty 3

## 2018-04-19 MED ORDER — AMLODIPINE BESYLATE 10 MG PO TABS
10.0000 mg | ORAL_TABLET | Freq: Every day | ORAL | Status: DC
  Filled 2018-04-19: qty 2

## 2018-04-19 MED ORDER — ISOSORBIDE MONONITRATE ER 30 MG PO TB24
30.0000 mg | ORAL_TABLET | Freq: Every day | ORAL | Status: DC
  Administered 2018-04-20: 30 mg via ORAL
  Filled 2018-04-19: qty 1

## 2018-04-19 MED ORDER — METOPROLOL TARTRATE 25 MG PO TABS
25.0000 mg | ORAL_TABLET | Freq: Two times a day (BID) | ORAL | Status: DC
  Administered 2018-04-20: 25 mg via ORAL
  Filled 2018-04-19: qty 1

## 2018-04-19 MED ORDER — LIDOCAINE HCL (PF) 1 % IJ SOLN: 5.0000 mL | INTRAMUSCULAR | Status: DC | PRN

## 2018-04-19 MED ORDER — PRO-STAT SUGAR FREE PO LIQD
30.0000 mL | Freq: Two times a day (BID) | ORAL | Status: DC
  Administered 2018-04-20: 30 mL via ORAL
  Filled 2018-04-19: qty 30

## 2018-04-19 MED ORDER — OMEGA-3-ACID ETHYL ESTERS 1 G PO CAPS
1.0000 g | ORAL_CAPSULE | Freq: Two times a day (BID) | ORAL | Status: DC
  Administered 2018-04-20: 1 g via ORAL
  Filled 2018-04-19: qty 1

## 2018-04-19 MED ORDER — GUAIFENESIN 100 MG/5ML PO SOLN
10.0000 mL | ORAL | Status: DC | PRN
  Filled 2018-04-19: qty 10

## 2018-04-19 MED ORDER — HEPARIN SODIUM (PORCINE) 5000 UNIT/ML IJ SOLN
5000.0000 [IU] | Freq: Three times a day (TID) | INTRAMUSCULAR | Status: DC
  Filled 2018-04-19 (×2): qty 1

## 2018-04-19 MED ORDER — ACETAMINOPHEN 325 MG PO TABS: 650.0000 mg | ORAL_TABLET | ORAL | Status: DC | PRN

## 2018-04-19 NOTE — ED Triage Notes (Signed)
Per EMS- pt reports CP and SOB starting today. Pt is dialysis pt and CHF. Pt reports last dialysis Friday. Normally MWF. Pt received 1 nitro, 324 ASA and 2 duonebs en route with no relief. Pt on CPAP at arrival. Pt placed on ED Bipap. Pt spO2 increased from 85 to 95%. Pt alert and oriented

## 2018-04-19 NOTE — Progress Notes (Signed)
Trialing patient off BIPAP per RN. Placed on 3.5 lpm [place]. No distress noted at this time.

## 2018-04-19 NOTE — ED Provider Notes (Signed)
[facility] EMERGENCY DEPARTMENT Provider Note   CSN: [id number] Arrival date & time: 04/19/18  1725     History   Chief Complaint Chief Complaint  Patient presents with  . Respiratory Distress    HPI Trevor [name] is a 72 y.o. male.  Level 5 caveat for urgent need for intervention.  Patient lives in a nursing home.  He has multiple health problems including end-stage renal disease, AAA, blindness, CHF, CVA, malnutrition.  He presents with chest pain and dyspnea.  When EMS arrived, he was given nitroglycerin and aspirin.  BiPAP was applied.  Dialysis Monday Wednesday and Friday.  He did not go to dialysis today.     Past Medical History:  Diagnosis Date  . AAA (abdominal aortic aneurysm) ([place])   . Anxiety   . Arthritis   . Blindness   . CHF (congestive heart failure) ([place])   . 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 ESRD on hemodialysis ([place])    started HD 2014-15  . Headache(784.0)   . HTN (hypertension)   . Protein-calorie malnutrition [facility])     Patient Active Problem List   Diagnosis Date Noted  . Acute respiratory failure with hypoxia ([place]) 03/29/2018  . Sleep stage dysfunction 11/10/2017  . 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 Vascular device, implant, or graft complication 70/35/0093  . Peripheral arterial disease ([place]) 10/20/2017  . Non-allergic rhinitis 10/20/2017  . 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 BPH (benign prostatic hyperplasia) 07/07/2017  . At risk for adverse drug reaction 07/02/2017  . Ischemia of extremity 06/28/2017  . Acute pulmonary edema ([place]) 06/27/2017  . 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 Essential hypertension 03/08/2016  . Dyslipidemia 01/12/2016  . Chest pain 08/12/2015  . Accelerated hypertension 08/12/2015  . Carotid stenosis   . Abdominal aortic aneurysm ([place]) 03/06/2015  . Protein-calorie malnutrition, severe ([place]) 05/20/2014  . End-stage renal  disease on hemodialysis ([place]) 05/19/2014  . Anemia of renal disease 05/19/2014  . Coronary artery disease, non-occlusive:  09/28/2012  . Secondary hyperparathyroidism ([place]) 09/27/2012  . Non compliance with medical treatment 09/27/2012  . Chronic combined systolic and diastolic CHF (congestive heart failure) ([place]) 10/10/2011  . A-fib ([place]) 10/08/2011  . History of CVA (cerebrovascular accident) 10/07/2011  . Cocaine abuse ([place]) 10/07/2011  . Tobacco use disorder 08/07/2011  . Bruit 08/07/2011    Past Surgical History:  Procedure Laterality Date  . AXILLARY-FEMORAL BYPASS GRAFT  06/28/2017   Procedure: BYPASS GRAFT RIGHT AXILLA-BIFEMORAL USING 8MM X 30CM AND 8MM X 60CM HEMASHIELD GOLD GRAFTS;  Surgeon: Rosetta Posner, MD;  Location: [facility];  Service: Vascular;;  . CARDIAC CATHETERIZATION N/A 11/01/2016   Procedure: Left Heart Cath and Coronary Angiography;  Surgeon: Lorretta Harp, MD;  Location: [facility] CV LAB;  Service: Cardiovascular;  Laterality: N/A;  . ENDARTERECTOMY FEMORAL Bilateral 06/28/2017   Procedure: ENDARTERECTOMY BILATERAL FEMORAL ARTERIES;  Surgeon: Rosetta Posner, MD;  Location: [facility];  Service: Vascular;  Laterality: Bilateral;  . INSERTION OF DIALYSIS CATHETER  09/21/2012   Procedure: INSERTION OF DIALYSIS CATHETER;  Surgeon: Angelia Mould, MD;  Location: [facility];  Service: Vascular;  Laterality: N/A;  Right Internal Jugular Placement  . LEFT HEART CATHETERIZATION WITH CORONARY ANGIOGRAM N/A 09/28/2012   Procedure: LEFT HEART CATHETERIZATION WITH CORONARY ANGIOGRAM;  Surgeon: Sherren Mocha, MD;  Location[facility] C[facility] LAB;  Service: Cardiovascular;  Laterality: N/A;  . LEFT HEART CATHETERIZATION WITH  CORONARY ANGIOGRAM N/A 07/07/2014   Procedure: LEFT HEART CATHETERIZATION WITH CORONARY ANGIOGRAM;  Surgeon: Leonie Man, MD;  Location: [facility] C[facility] LAB;  Service: Cardiovascular;  Laterality: N/A;  . right hand    . TEE WITHOUT CARDIOVERSION  10/09/2011   Procedure:  TRANSESOPHAGEAL ECHOCARDIOGRAM (TEE);  Surgeon: Lelon Perla, MD;  Location: [facility] ENDOSCOPY;  Service: Cardiovascular;  Laterality: N/A;        Home Medications    Prior to Admission medications   Medication Sig Start Date End Date Taking? Authorizing Provider  Amino Acids-Protein Hydrolys (FEEDING SUPPLEMENT, PRO-STAT SUGAR FREE 64,) LIQD Take 30 mLs by mouth 2 (two) times daily.   Yes [provider]  amLODipine (NORVASC) 10 MG tablet Take 10 mg by mouth at bedtime.  04/13/17  Yes [provider]  aspirin 81 MG chewable tablet Chew 1 tablet (81 mg total) by mouth daily. 11/04/16  Yes Lyda Jester M, PA-C  atorvastatin (LIPITOR) 40 MG tablet Take 40 mg by mouth at bedtime.   Yes [provider]  bisacodyl (DULCOLAX) 10 MG suppository Place 10 mg rectally daily as needed (constipation not relieved by MOM).   Yes [provider]  cinacalcet (SENSIPAR) 90 MG tablet Take 90 mg by mouth daily with supper.   Yes [provider]  clopidogrel (PLAVIX) 75 MG tablet Take 1 tablet (75 mg total) by mouth daily. 04/05/15  Yes Barton Dubois, MD  docusate sodium (COLACE) 100 MG capsule Take 200 mg by mouth at bedtime.    Yes [provider]  doxercalciferol (HECTOROL) 4 MCG/2ML injection Inject 1 mL (2 mcg total) into the vein every Monday, Wednesday, and Friday with hemodialysis. 01/15/18  Yes Colbert Ewing, MD  ferric citrate (AURYXIA) 1 GM 210 MG(Fe) tablet Take 630 mg by mouth 3 (three) times daily with meals.    Yes [provider]  fluticasone (FLONASE) 50 MCG/ACT nasal spray Place 1 spray into both nostrils 2 (two) times daily.    Yes [provider]  gabapentin (NEURONTIN) 300 MG capsule Take 1 capsule (300 mg total) by mouth at bedtime. 01/14/18  Yes Colbert Ewing, MD  guaiFENesin (ROBITUSSIN) 100 MG/5ML SOLN Take 10 mLs by mouth every 4 (four) hours as needed for cough or to loosen phlegm.   Yes [provider]    hydrALAZINE (APRESOLINE) 50 MG tablet Take 50 mg by mouth 3 (three) times daily.   Yes [provider]  isosorbide mononitrate (IMDUR) 30 MG 24 hr tablet Take 30 mg by mouth daily.   Yes [provider]  metoprolol tartrate (LOPRESSOR) 25 MG tablet Take 1 tablet (25 mg total) by mouth 2 (two) times daily. 11/03/16  Yes Lyda Jester M, PA-C  Multiple Vitamin (MULTIVITAMIN WITH MINERALS) TABS tablet Take 1 tablet by mouth daily.   Yes [provider]  Nutritional Supplements (FEEDING SUPPLEMENT, NEPRO CARB STEADY,) LIQD Take 237 mLs by mouth 2 (two) times daily between meals. 12/11/16  Yes Eulogio Bear U, DO  omega-3 acid ethyl esters (LOVAZA) 1 g capsule Take 1 g by mouth 2 (two) times daily.   Yes [provider]  phenol (CHLORASEPTIC) 1.4 % LIQD Use as directed 2 sprays in the mouth or throat every 8 (eight) hours as needed for throat irritation / pain.   Yes [provider]  senna (SENOKOT) 8.6 MG TABS tablet Take 1 tablet by mouth daily.   Yes [provider]  ENSURE PLUS (ENSURE PLUS) LIQD Take 237 mLs by mouth  3 (three) times daily between meals.    [provider]  hydrALAZINE (APRESOLINE) 25 MG tablet Take 25 mg by mouth 3 (three) times daily.     [provider]  multivitamin (RENA-VIT) TABS tablet Take 1 tablet by mouth at bedtime. 07/02/17   Velvet Bathe, MD  Omega-3 Fatty Acids (FISH OIL) 1000 MG CAPS Take 1,000 mg by mouth 2 (two) times daily.    [provider]    Family History Family History  Problem Relation Age of Onset  . Heart attack Mother        MI in her 10s  . Diabetes Mother   . Alcohol abuse Father   . Anesthesia problems Neg Hx   . Hypotension Neg Hx   . Malignant hyperthermia Neg Hx   . Pseudochol deficiency Neg Hx     Social History Social History   Tobacco Use  . Smoking status: Current Every Day Smoker    Packs/day: 0.50    Years: 60.00    Pack years: 30.00    Types:  Cigarettes  . Smokeless tobacco: Never Used  . Tobacco comment: 02/09/18:3-4 /day  Substance Use Topics  . Alcohol use: No    Alcohol/week: 0.0 oz    Comment: Occasional  . Drug use: No    Frequency: 2.0 times per week    Types: "Crack" cocaine     Allergies   Patient has no known allergies.   Review of Systems Review of Systems  Unable to perform ROS: Acuity of condition     Physical Exam Updated Vital Signs BP (!) 157/87   Pulse (!) 102   Resp (!) 21   SpO2 100%   Physical Exam  Constitutional:  On BiPAP with minimal respiratory distress  HENT:  Head: Normocephalic and atraumatic.  Eyes: Conjunctivae are normal.  Neck: Neck supple.  Cardiovascular: Normal rate and regular rhythm.  Pulmonary/Chest:  BIPAP  Abdominal: Soft. Bowel sounds are normal.  Musculoskeletal: Normal range of motion.  Neurological: He is alert.  Skin: Skin is warm and dry.  Psychiatric: He has a normal mood and affect. His behavior is normal.  Nursing note and vitals reviewed.    ED Treatments / Results  Labs (all labs ordered are listed, but only abnormal results are displayed) Labs Reviewed  CBC WITH DIFFERENTIAL/PLATELET - Abnormal; Notable for the following components:      Result Value   Hemoglobin 12.7 (*)    HCT 38.9 (*)    All other components within normal limits  COMPREHENSIVE METABOLIC PANEL - Abnormal; Notable for the following components:   Sodium 134 (*)    Chloride 93 (*)    Glucose, Bld 140 (*)    BUN 41 (*)    Creatinine, Ser 10.18 (*)    Albumin 3.3 (*)    ALT 10 (*)    GFR calc non Af Amer 4 (*)    GFR calc Af Amer 5 (*)    All other components within normal limits  I-STAT CHEM 8, ED - Abnormal; Notable for the following components:   Sodium 133 (*)    Chloride 95 (*)    BUN 39 (*)    Creatinine, Ser 9.60 (*)    Glucose, Bld 135 (*)    Calcium, Ion 1.10 (*)    All other components within normal limits  I-STAT TROPONIN, ED     EKG None  Radiology Dg Chest Port 1 View  Result Date: 04/19/2018 CLINICAL DATA:  Dyspnea EXAM:  PORTABLE CHEST 1 VIEW COMPARISON:  03/29/2018 chest radiograph. FINDINGS: Stable cardiomediastinal silhouette with normal heart size. No pneumothorax. No pleural effusion. There is diffuse parahilar interstitial prominence and hazy opacity. IMPRESSION: Diffuse parahilar interstitial prominence and hazy opacity. Normal heart size. Findings are nonspecific and could represent pulmonary edema, with other etiologies including alveolar hemorrhage or atypical infection not excluded. Follow-up chest radiographs recommended to resolution. Electronically Signed   By: Ilona Sorrel M.D.   On: 04/19/2018 18:09    Procedures Procedures (including critical care time)  Medications Ordered in ED Medications  ipratropium-albuterol (DUONEB) 0.5-2.5 (3) MG/3ML nebulizer solution 3 mL (3 mLs Nebulization Given 04/19/18 1804)     Initial Impression / Assessment and Plan / ED Course  I have reviewed the triage vital signs and the nursing notes.  Pertinent labs & imaging results that were available during my care of the patient were reviewed by me and considered in my medical decision making (see chart for details).     Patient presents with chest pain and dyspnea.  He missed dialysis today.  EKG shows ST depression inferiorly and anteriorly.  Chest x-ray shows possible pulmonary edema.  First troponin negative.  Discussed with cardiologist on-call who recommended no acute intervention at this time.  Discussed with nephrologist for potential dialysis.  Admit to general medicine for further work-up.   CRITICAL CARE Performed by: Nat Christen Total critical care time: 30 minutes Critical care time was exclusive of separately billable procedures and treating other patients. Critical care was necessary to treat or prevent imminent or life-threatening deterioration. Critical care was time spent personally by me on  the following activities: development of treatment plan with patient and/or surrogate as well as nursing, discussions with consultants, evaluation of patient's response to treatment, examination of patient, obtaining history from patient or surrogate, ordering and performing treatments and interventions, ordering and review of laboratory studies, ordering and review of radiographic studies, pulse oximetry and re-evaluation of patient's condition.  Final Clinical Impressions(s) / ED Diagnoses   Final diagnoses:  Chest pain, unspecified type  Dyspnea, unspecified type  ESRD (end stage renal disease) [facility])    ED Discharge Orders    None       Nat Christen, MD 04/19/18 2023

## 2018-04-19 NOTE — Consult Note (Signed)
Trevor [name] Admit Date: 04/19/2018 04/19/2018 Trevor [name] Requesting Physician:  Trevor Axon MD  Reason for Consult:  Hypoxia, Acute Dyspnea, ESRD HPI:  72 year old male ESRD who presented to the emergency room today with hypoxia and acute dyspnea.  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  He had been achieving his estimated dry weight earlier in the week.  He did not attend hemodialysis treatment today, but he does not know why.  He lives in a nursing facility.  He was hypoxic upon evaluation with an SPO2 in the mid 80s.  Upon presentation to the emergency room he received BiPAP therapy with improvement in oxygenation.  Blood pressure has been quite elevated, as high as 150s to 190s over 80s to 90s.  Labs upon presentation with potassium of 4.9, BUN 39, bicarbonate 26, hemoglobin 12.7.  Portable chest x-ray with diffuse interstitial prominence, likely reflecting edema.  Initial troponin negative.  Patient's main complaint is that he is hungry.  Outpt HD Orders Unit: [facility] Days: MWF Time: 4h Dialyzer: F180 EDW: 57.5kg K/Ca: 2 / 2  Access: AVF Needle Size: 15g BFR/DFR: 400 / a1.5 UF Proflie: 4 VDRA: Hectorl 3qTx EPO: Mircera 60 q2wk, last given 5/8 IV Fe: no Heparin: no ]  ROS Balance of 12 systems is negative w/ exceptions as above  PMH  Past Medical History:  Diagnosis Date  . AAA (abdominal aortic aneurysm) ([place])   . Anxiety   . Arthritis   . Blindness   . CHF (congestive heart  failure) ([place])   . 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 ESRD on hemodialysis ([place])    started HD 2014-15  . Headache(784.0)   . HTN (hypertension)   . Protein-calorie malnutrition ([place])    Trevor [name]  Past Surgical History:  Procedure Laterality Date  . AXILLARY-FEMORAL BYPASS GRAFT  06/28/2017   Procedure: BYPASS GRAFT RIGHT AXILLA-BIFEMORAL USING 8MM X 30CM AND 8MM X 60CM HEMASHIELD GOLD GRAFTS;  Surgeon: Rosetta Posner, MD;  Location: [facility];  Service: Vascular;;  . CARDIAC CATHETERIZATION N/A 11/01/2016   Procedure: Left Heart Cath and Coronary Angiography;  Surgeon: Lorretta Harp, MD;  Location: [place] CV LAB;  Service: Cardiovascular;  Laterality: N/A;  . ENDARTERECTOMY FEMORAL Bilateral 06/28/2017   Procedure: ENDARTERECTOMY BILATERAL FEMORAL ARTERIES;  Surgeon: Rosetta Posner, MD;  Location: [facility];  Service: Vascular;  Laterality: Bilateral;  . INSERTION OF DIALYSIS CATHETER  09/21/2012   Procedure: INSERTION OF DIALYSIS CATHETER;  Surgeon: Angelia Mould, MD;  Location: [place];  Service: Vascular;  Laterality: N/A;  Right Internal Jugular Placement  . LEFT HEART CATHETERIZATION WITH CORONARY ANGIOGRAM N/A 09/28/2012   Procedure: LEFT HEART CATHETERIZATION WITH CORONARY ANGIOGRAM;  Surgeon: Sherren Mocha, MD;  Location: [facility] CATH LAB;  Service: Cardiovascular;  Laterality: N/A;  . LEFT HEART CATHETERIZATION WITH CORONARY ANGIOGRAM N/A 07/07/2014   Procedure: LEFT HEART CATHETERIZATION WITH CORONARY ANGIOGRAM;  Surgeon: Leonie Man, MD;  Location: [facility]  CATH LAB;  Service: Cardiovascular;  Laterality: N/A;  . right hand    . TEE WITHOUT CARDIOVERSION  10/09/2011   Procedure: TRANSESOPHAGEAL ECHOCARDIOGRAM (TEE);  Surgeon: Lelon Perla, MD;  Location: [facility] ENDOSCOPY;  Service: Cardiovascular;  Laterality: N/A;   FH  Family History  Problem Relation Age of Onset  . Heart attack Mother        MI in her 72s  . Diabetes Mother   .  Alcohol abuse Father   . Anesthesia problems Neg Hx   . Hypotension Neg Hx   . Malignant hyperthermia Neg Hx   . Pseudochol deficiency Neg Hx    SH  reports that he has been smoking cigarettes.  He has a 30.00 pack-year smoking history. He has never used smokeless tobacco. He reports that he does not drink alcohol or use drugs. Allergies No Known Allergies Home medications Prior to Admission medications   Medication Sig Start Date End Date Taking? Authorizing Provider  Amino Acids-Protein Hydrolys (FEEDING SUPPLEMENT, PRO-STAT SUGAR FREE 64,) LIQD Take 30 mLs by mouth 2 (two) times daily.   Yes [provider]  amLODipine (NORVASC) 10 MG tablet Take 10 mg by mouth at bedtime.  04/13/17  Yes [provider]  aspirin 81 MG chewable tablet Chew 1 tablet (81 mg total) by mouth daily. 11/04/16  Yes Lyda Jester M, PA-C  atorvastatin (LIPITOR) 40 MG tablet Take 40 mg by mouth at bedtime.   Yes [provider]  bisacodyl (DULCOLAX) 10 MG suppository Place 10 mg rectally daily as needed (constipation).    Yes [provider]  cinacalcet (SENSIPAR) 90 MG tablet Take 90 mg by mouth daily with supper.   Yes [provider]  clopidogrel (PLAVIX) 75 MG tablet Take 1 tablet (75 mg total) by mouth daily. 04/05/15  Yes Barton Dubois, MD  docusate sodium (COLACE) 100 MG capsule Take 200 mg by mouth at bedtime.    Yes [provider]  doxercalciferol (HECTOROL) 4 MCG/2ML injection Inject 1 mL (2 mcg total) into the vein every Monday, Wednesday, and Friday with hemodialysis. 01/15/18  Yes Colbert Ewing, MD  ferric citrate (AURYXIA) 1 GM 210 MG(Fe) tablet Take 630 mg by mouth 3 (three) times daily with meals.    Yes [provider]  fluticasone (FLONASE) 50 MCG/ACT nasal spray Place 1 spray into both nostrils 2 (two) times daily.    Yes [provider]  gabapentin (NEURONTIN) 300 MG capsule Take 1 capsule (300 mg total) by mouth at  bedtime. 01/14/18  Yes Colbert Ewing, MD  guaiFENesin (ROBITUSSIN) 100 MG/5ML SOLN Take 10 mLs by mouth every 4 (four) hours as needed for cough.    Yes [provider]  hydrALAZINE (APRESOLINE) 50 MG tablet Take 50 mg by mouth 3 (three) times daily.   Yes [provider]  isosorbide mononitrate (IMDUR) 30 MG 24 hr tablet Take 30 mg by mouth daily.   Yes [provider]  metoprolol tartrate (LOPRESSOR) 25 MG tablet Take 1 tablet (25 mg total) by mouth 2 (two) times daily. 11/03/16  Yes Lyda Jester M, PA-C  Multiple Vitamin (MULTIVITAMIN WITH MINERALS) TABS tablet Take 1 tablet by mouth daily.   Yes [provider]  Nutritional Supplements (FEEDING SUPPLEMENT, NEPRO CARB STEADY,) LIQD Take 237 mLs by mouth 2 (two) times daily between meals. 12/11/16  Yes Eulogio Bear U, DO  omega-3 acid ethyl esters (LOVAZA) 1 g capsule Take 1 g by mouth 2 (two) times daily.  Yes [provider]  phenol (CHLORASEPTIC) 1.4 % LIQD Use as directed 2 sprays in the mouth or throat every 8 (eight) hours as needed for throat irritation / pain.   Yes [provider]  senna (SENOKOT) 8.6 MG TABS tablet Take 1 tablet by mouth daily.   Yes [provider]  multivitamin (RENA-VIT) TABS tablet Take 1 tablet by mouth at bedtime. Patient not taking: Reported on 04/19/2018 07/02/17   Velvet Bathe, MD    Current Medications Scheduled Meds: . amLODipine  10 mg Oral QHS  . [START ON 04/20/2018] aspirin  81 mg Oral Daily  . [START ON 04/20/2018] atorvastatin  40 mg Oral q1800  . [START ON 04/20/2018] cinacalcet  90 mg Oral Q supper  . [START ON 04/20/2018] clopidogrel  75 mg Oral Daily  . docusate sodium  200 mg Oral QHS  . ENSURE PLUS  237 mL Oral TID BM  . feeding supplement (PRO-STAT SUGAR FREE 64)  30 mL Oral BID  . gabapentin  300 mg Oral QHS  . heparin  5,000 Units Subcutaneous Q8H  . hydrALAZINE  25 mg Oral TID  . [START ON 04/20/2018] isosorbide  mononitrate  30 mg Oral Daily  . metoprolol tartrate  25 mg Oral BID  . [START ON 04/20/2018] multivitamin with minerals  1 tablet Oral Daily  . [START ON 04/20/2018] omega-3 acid ethyl esters  1 g Oral BID  . [START ON 04/20/2018] senna  1 tablet Oral Daily   Continuous Infusions: PRN Meds:.acetaminophen, ALPRAZolam, bisacodyl, guaiFENesin, morphine injection, ondansetron (ZOFRAN) IV  CBC Recent Labs  Lab 04/19/18 1753 04/19/18 1803  WBC 7.1  --   NEUTROABS 5.1  --   HGB 12.7* 13.3  HCT 38.9* 39.0  MCV 88.6  --   PLT 198  --    Basic Metabolic Panel Recent Labs  Lab 04/19/18 1753 04/19/18 1803  NA 134* 133*  K 5.0 4.9  CL 93* 95*  CO2 26  --   GLUCOSE 140* 135*  BUN 41* 39*  CREATININE 10.18* 9.60*  CALCIUM 9.6  --     Physical Exam  Blood pressure (!) 168/83, pulse 95, resp. rate (!) 22, SpO2 100 %. GEN: On BiPAP, resting comfortably, converses through mask easily ENT: NCAT, BiPAP in place EYES: EOMI CV: RRR, no rub PULM: Crackles in bases with diminished movement, clears in upper lung fields, nl wob ABD: s/nt/nd SKIN: No rashes/lesions EXT:No LEE, LUE AVF +B/T   Assessment 63M ESRD with acute hypoxia/dyspnea req BiPAP after missed HD and likely hypervolemic.    1. ESRD on HD 2. Acute Dyspniea / Hypoxia, pulmononary edema, on BiPAP 3. HTN, uncontrolled 4. Anemia on ESA 5. CKD BMD, On VDRA 6. SCHF / NICM   Plan 1. HD toinght or first thing in AM pending availability; UF 5L as able, 2K, No heparin, 400/a1.5 2. F/u tomorrow post HD to see if req 2nd tx off schedule.    Pearson Grippe MD [phone] pgr 04/19/2018, 10:21 PM

## 2018-04-19 NOTE — H&P (Signed)
History and Physical    ROSBEL BUCKNER ZOX:[id number] DOB: 23-Dec-1945 DOA: 04/19/2018  PCP: [name]   Patient coming from: [name]   Chief Complaint: SOB, chest pain   HPI: Trevor [name] is a 72 y.o. male with medical history significant for coronary artery disease, chronic combined systolic/diastolic CHF, end-stage renal disease on hemodialysis, and hypertension, now presenting to the emergency department for evaluation of shortness of breath and chest pain.  History is somewhat limited due to the patient's clinical condition with respiratory failure on BiPAP.  He reports completing dialysis on 04/16/2018, but did not go today for unclear reasons.  He developed shortness of breath over the past day or so, worsening acutely this evening, with associated chest pain.  EMS was called out, found the patient to be saturating in mid 80s, treated him with nitroglycerin, nebs, 324 mg of aspirin, and transported the patient CPAP.  ED Course: Upon arrival to the ED, patient is found to be saturating 95% on BiPAP, tachypneic, slightly tachycardic, and with stable blood pressure.  EKG features a sinus tachycardia with rate 105 and diffuse repolarization abnormality.  Chest x-ray is notable for diffuse perihilar interstitial prominence, likely reflecting edema.  Chemistry panel is notable for slight hyponatremia, normal potassium and bicarbonate, and BUN of 41.  CBC features of mild normocytic anemia.  Troponin is undetectable.  ED physician discussed the case with cardiology who reviewed the EKG and suspects this is related to the pulmonary edema.  Nephrology was also consulted by the ED physician and will see the patient in consultation.  He remains hemodynamically stable and will be admitted to the stepdown unit for ongoing evaluation and management.acute pulmonary edema in the setting of missed dialysis with acute hypoxic respiratory failure and chest pain.  Review of Systems:  All other systems  reviewed and apart from HPI, are negative.  Past Medical History:  Diagnosis Date  . AAA (abdominal aortic aneurysm) ([place])   . Anxiety   . Arthritis   . Blindness   . CHF (congestive heart failure) ([place])   . 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 ESRD on hemodialysis ([place])    started HD 2014-15  . Headache(784.0)   . HTN (hypertension)   . Protein-calorie malnutrition ([place])     Past Surgical History:  Procedure Laterality Date  . AXILLARY-FEMORAL BYPASS GRAFT  06/28/2017   Procedure: BYPASS GRAFT RIGHT AXILLA-BIFEMORAL USING 8MM X 30CM AND 8MM X 60CM HEMASHIELD GOLD GRAFTS;  Surgeon: Rosetta Posner, MD;  Location: [facility];  Service: Vascular;;  . CARDIAC CATHETERIZATION N/A 11/01/2016   Procedure: Left Heart Cath and Coronary Angiography;  Surgeon: Lorretta Harp, MD;  Location: [place] CV LAB;  Service: Cardiovascular;  Laterality: N/A;  . ENDARTERECTOMY FEMORAL Bilateral 06/28/2017   Procedure: ENDARTERECTOMY BILATERAL FEMORAL ARTERIES;  Surgeon: Rosetta Posner, MD;  Location: [place];  Service: Vascular;  Laterality: Bilateral;  . INSERTION OF DIALYSIS CATHETER  09/21/2012   Procedure: INSERTION OF DIALYSIS CATHETER;  Surgeon: Angelia Mould, MD;  Location: [place] [place];  Service: Vascular;  Laterality: N/A;  Right Internal Jugular Placement  . LEFT HEART CATHETERIZATION WITH CORONARY ANGIOGRAM N/A 09/28/2012   Procedure: LEFT HEART CATHETERIZATION WITH CORONARY ANGIOGRAM;  Surgeon: Sherren Mocha, MD;  Location: [facility] CATH LAB;  Service: Cardiovascular;  Laterality: N/A;  . LEFT HEART CATHETERIZATION WITH CORONARY ANGIOGRAM N/A 07/07/2014   Procedure: LEFT HEART CATHETERIZATION WITH  CORONARY ANGIOGRAM;  Surgeon: Leonie Man, MD;  Location: [facility] CATH LAB;  Service: Cardiovascular;  Laterality: N/A;  . right hand    . TEE WITHOUT CARDIOVERSION  10/09/2011   Procedure: TRANSESOPHAGEAL ECHOCARDIOGRAM (TEE);  Surgeon: Lelon Perla, MD;   Location: [facility] ENDOSCOPY;  Service: Cardiovascular;  Laterality: N/A;     reports that he has been smoking cigarettes.  He has a 30.00 pack-year smoking history. He has never used smokeless tobacco. He reports that he does not drink alcohol or use drugs.  No Known Allergies  Family History  Problem Relation Age of Onset  . Heart attack Mother        MI in her 62s  . Diabetes Mother   . Alcohol abuse Father   . Anesthesia problems Neg Hx   . Hypotension Neg Hx   . Malignant hyperthermia Neg Hx   . Pseudochol deficiency Neg Hx      Prior to Admission medications   Medication Sig Start Date End Date Taking? Authorizing Provider  Amino Acids-Protein Hydrolys (FEEDING SUPPLEMENT, PRO-STAT SUGAR FREE 64,) LIQD Take 30 mLs by mouth 2 (two) times daily.   Yes [provider]  amLODipine (NORVASC) 10 MG tablet Take 10 mg by mouth at bedtime.  04/13/17  Yes [provider]  aspirin 81 MG chewable tablet Chew 1 tablet (81 mg total) by mouth daily. 11/04/16  Yes Lyda Jester M, PA-C  atorvastatin (LIPITOR) 40 MG tablet Take 40 mg by mouth at bedtime.   Yes [provider]  bisacodyl (DULCOLAX) 10 MG suppository Place 10 mg rectally daily as needed (constipation not relieved by MOM).   Yes [provider]  cinacalcet (SENSIPAR) 90 MG tablet Take 90 mg by mouth daily with supper.   Yes [provider]  clopidogrel (PLAVIX) 75 MG tablet Take 1 tablet (75 mg total) by mouth daily. 04/05/15  Yes Barton Dubois, MD  docusate sodium (COLACE) 100 MG capsule Take 200 mg by mouth at bedtime.    Yes [provider]  doxercalciferol (HECTOROL) 4 MCG/2ML injection Inject 1 mL (2 mcg total) into the vein every Monday, Wednesday, and Friday with hemodialysis. 01/15/18  Yes Colbert Ewing, MD  ferric citrate (AURYXIA) 1 GM 210 MG(Fe) tablet Take 630 mg by mouth 3 (three) times daily with meals.    Yes [provider]  fluticasone (FLONASE) 50 MCG/ACT  nasal spray Place 1 spray into both nostrils 2 (two) times daily.    Yes [provider]  gabapentin (NEURONTIN) 300 MG capsule Take 1 capsule (300 mg total) by mouth at bedtime. 01/14/18  Yes Colbert Ewing, MD  guaiFENesin (ROBITUSSIN) 100 MG/5ML SOLN Take 10 mLs by mouth every 4 (four) hours as needed for cough or to loosen phlegm.   Yes [provider]  hydrALAZINE (APRESOLINE) 50 MG tablet Take 50 mg by mouth 3 (three) times daily.   Yes [provider]  isosorbide mononitrate (IMDUR) 30 MG 24 hr tablet Take 30 mg by mouth daily.   Yes [provider]  metoprolol tartrate (LOPRESSOR) 25 MG tablet Take 1 tablet (25 mg total) by mouth 2 (two) times daily. 11/03/16  Yes Lyda Jester M, PA-C  Multiple Vitamin (MULTIVITAMIN WITH MINERALS) TABS tablet Take 1 tablet by mouth daily.   Yes [provider]  Nutritional Supplements (FEEDING SUPPLEMENT, NEPRO CARB STEADY,) LIQD Take 237 mLs by mouth 2 (two) times daily between meals. 12/11/16  Yes Eulogio Bear U, DO  omega-3 acid ethyl  esters (LOVAZA) 1 g capsule Take 1 g by mouth 2 (two) times daily.   Yes [provider]  phenol (CHLORASEPTIC) 1.4 % LIQD Use as directed 2 sprays in the mouth or throat every 8 (eight) hours as needed for throat irritation / pain.   Yes [provider]  senna (SENOKOT) 8.6 MG TABS tablet Take 1 tablet by mouth daily.   Yes [provider]  ENSURE PLUS (ENSURE PLUS) LIQD Take 237 mLs by mouth 3 (three) times daily between meals.    [provider]  hydrALAZINE (APRESOLINE) 25 MG tablet Take 25 mg by mouth 3 (three) times daily.     [provider]  multivitamin (RENA-VIT) TABS tablet Take 1 tablet by mouth at bedtime. 07/02/17   Velvet Bathe, MD  Omega-3 Fatty Acids (FISH OIL) 1000 MG CAPS Take 1,000 mg by mouth 2 (two) times daily.    [provider]    Physical Exam: Vitals:   04/19/18 1830 04/19/18 1930 04/19/18 1943  04/19/18 2000  BP: (!) 180/82 (!) 159/80 (!) 159/80 (!) 157/87  Pulse: 87 (!) 108 92 (!) 102  Resp: 18 (!) 25  (!) 21  SpO2: 97% 100% 100% 100%      Constitutional: Tachypneic, dyspneic with speech, no diaphoresis  Eyes: PERTLA, lids and conjunctivae normal ENMT: Mucous membranes are moist. Posterior pharynx clear of any exudate or lesions.   Neck: normal, supple, no masses, no thyromegaly Respiratory: Fine rales bilaterally. Mild tachypnea and dyspnea with speech. No pallor or cyanosis.   Cardiovascular: S1 & S2 heard, regular rate and rhythm. No extremity edema.  Abdomen: No distension, no tenderness, soft. Bowel sounds normal.  Musculoskeletal: no clubbing / cyanosis. No joint deformity upper and lower extremities.   Skin: no significant rashes, lesions, ulcers. Warm, dry, well-perfused. Neurologic: No facial asymmetry. Sensation intact. Moving all extremities.  Psychiatric: Alert and oriented x 3. Agitated, poorly cooperative with interview and exam.     Labs on Admission: I have personally reviewed following labs and imaging studies  CBC: Recent Labs  Lab 04/19/18 1753 04/19/18 1803  WBC 7.1  --   NEUTROABS 5.1  --   HGB 12.7* 13.3  HCT 38.9* 39.0  MCV 88.6  --   PLT 198  --    Basic Metabolic Panel: Recent Labs  Lab 04/19/18 1753 04/19/18 1803  NA 134* 133*  K 5.0 4.9  CL 93* 95*  CO2 26  --   GLUCOSE 140* 135*  BUN 41* 39*  CREATININE 10.18* 9.60*  CALCIUM 9.6  --    GFR: CrCl cannot be calculated (Unknown ideal weight.). Liver Function Tests: Recent Labs  Lab 04/19/18 1753  AST 15  ALT 10*  ALKPHOS 113  BILITOT 1.2  PROT 7.7  ALBUMIN 3.3*   No results for input(s): LIPASE, AMYLASE in the last 168 hours. No results for input(s): AMMONIA in the last 168 hours. Coagulation Profile: No results for input(s): INR, PROTIME in the last 168 hours. Cardiac Enzymes: No results for input(s): CKTOTAL, CKMB, CKMBINDEX, TROPONINI in the last 168  hours. BNP (last 3 results) No results for input(s): PROBNP in the last 8760 hours. HbA1C: No results for input(s): HGBA1C in the last 72 hours. CBG: No results for input(s): GLUCAP in the last 168 hours. Lipid Profile: No results for input(s): CHOL, HDL, LDLCALC, TRIG, CHOLHDL, LDLDIRECT in the last 72 hours. Thyroid Function Tests: No results for input(s): TSH, T4TOTAL, FREET4, T3FREE, THYROIDAB in the last 72 hours.  Anemia Panel: No results for input(s): VITAMINB12, FOLATE, FERRITIN, TIBC, IRON, RETICCTPCT in the last 72 hours. Urine analysis:    Component Value Date/Time   COLORURINE YELLOW 04/04/2017 1833   APPEARANCEUR CLEAR 04/04/2017 1833   LABSPEC 1.008 04/04/2017 1833   PHURINE 9.0 (H) 04/04/2017 1833   GLUCOSEU 50 (A) 04/04/2017 1833   HGBUR MODERATE (A) 04/04/2017 1833   BILIRUBINUR NEGATIVE 04/04/2017 1833   [place] 04/04/2017 1833   PROTEINUR 100 (A) 04/04/2017 1833   UROBILINOGEN 0.2 01/08/2015 2116   NITRITE NEGATIVE 04/04/2017 1833   LEUKOCYTESUR NEGATIVE 04/04/2017 1833   Sepsis Labs: @LABRCNTIP (procalcitonin:4,lacticidven:4) )No results found for this or any previous visit (from the past 240 hour(s)).   Radiological Exams on Admission: Dg Chest Port 1 View  Result Date: 04/19/2018 CLINICAL DATA:  Dyspnea EXAM: PORTABLE CHEST 1 VIEW COMPARISON:  03/29/2018 chest radiograph. FINDINGS: Stable cardiomediastinal silhouette with normal heart size. No pneumothorax. No pleural effusion. There is diffuse parahilar interstitial prominence and hazy opacity. IMPRESSION: Diffuse parahilar interstitial prominence and hazy opacity. Normal heart size. Findings are nonspecific and could represent pulmonary edema, with other etiologies including alveolar hemorrhage or atypical infection not excluded. Follow-up chest radiographs recommended to resolution. Electronically Signed   By: Ilona Sorrel M.D.   On: 04/19/2018 18:09    EKG: Independently reviewed. Sinus  rhythm, diffuse repolarization abnormality.   Assessment/Plan  1. ESRD; pulmonary edema; acute hypoxic respiratory failure  - Presents with SOB and chest pain after missing HD, last dialyzed on 04/16/18  - Hypoxic with EMS and started on CPAP, transitioned to BiPAP in ED  - CXR with likely pulmonary edema  - Nephrology consulting and much appreciated  - Continue cardiac monitoring, continue supplemental O2, SLIV, fluid-restrict    2. Chest pain; CAD  - Presents with SOB and chest pain after missing dialysis  - CXR with opacities that likely represent edema, EKG with diffuse repolarization abnormalities, and troponin undetectable  - Cath from December 2017 with proximal Cx 60% stenosis and 1st diagonal 60% stenosis  - Likely secondary to hypervolemia/pulm edema; EKG reviewed by cardiology  - Continue cardiac monitoring, obtain serial troponin measurements, continue ASA, Plavix, statin, and beta-blocker   3. Hx of CVA  - No acute deficits  - Continue ASA, Plavix, and statin    4. Hypertension  - BP modestly elevated in ED - Continue Norvasc, hydralazine, Lopressor     DVT prophylaxis: sq heparin  Code Status: Full  Family Communication: Discussed with patient Consults called: Nephrology  Admission status: Inpatient    Vianne Bulls, MD [facility] Pager [phone]  If 7PM-7AM, please contact night-coverage [email or web address] Password [email or web address]  04/19/2018, 8:20 PM

## 2018-04-20 DIAGNOSIS — N186 End stage renal disease: Secondary | ICD-10-CM | POA: Diagnosis not present

## 2018-04-20 DIAGNOSIS — J810 Acute pulmonary edema: Secondary | ICD-10-CM | POA: Diagnosis not present

## 2018-04-20 DIAGNOSIS — J9601 Acute respiratory failure with hypoxia: Secondary | ICD-10-CM | POA: Diagnosis not present

## 2018-04-20 DIAGNOSIS — I429 Cardiomyopathy, unspecified: Secondary | ICD-10-CM | POA: Diagnosis not present

## 2018-04-20 LAB — BASIC METABOLIC PANEL
Anion gap: 17 — ABNORMAL HIGH (ref 5–15)
BUN: 19 mg/dL (ref 6–20)
CALCIUM: 9.9 mg/dL (ref 8.9–10.3)
CHLORIDE: 94 mmol/L — AB (ref 101–111)
CO2: 28 mmol/L (ref 22–32)
Creatinine, Ser: 5.38 mg/dL — ABNORMAL HIGH (ref 0.61–1.24)
GFR, EST AFRICAN AMERICAN: 11 mL/min — AB (ref >60)
GFR, EST NON AFRICAN AMERICAN: 10 mL/min — AB (ref >60)
Glucose, Bld: 170 mg/dL — ABNORMAL HIGH (ref 65–99)
Potassium: 3.7 mmol/L (ref 3.5–5.1)
SODIUM: 139 mmol/L (ref 135–145)

## 2018-04-20 LAB — CBC
HCT: 31.1 % — ABNORMAL LOW (ref 39.0–52.0)
Hemoglobin: 10.7 g/dL — ABNORMAL LOW (ref 13.0–17.0)
MCH: 29.1 pg (ref 26.0–34.0)
MCHC: 34.4 g/dL (ref 30.0–36.0)
MCV: 84.5 fL (ref 78.0–100.0)
PLATELETS: 175 10*3/uL (ref 150–400)
RBC: 3.68 MIL/uL — AB (ref 4.22–5.81)
RDW: 14.5 % (ref 11.5–15.5)
WBC: 4.1 10*3/uL (ref 4.0–10.5)

## 2018-04-20 LAB — TROPONIN I
TROPONIN I: 0.04 ng/mL — AB (ref <0.03)
Troponin I: 0.05 ng/mL (ref <0.03)

## 2018-04-20 LAB — MRSA PCR SCREENING: MRSA BY PCR: NEGATIVE

## 2018-04-20 MED ORDER — ALBUMIN HUMAN 25 % IV SOLN
25.0000 g | Freq: Once | INTRAVENOUS | Status: AC
  Administered 2018-04-20: 25 g via INTRAVENOUS

## 2018-04-20 MED ORDER — ALBUMIN HUMAN 25 % IV SOLN
INTRAVENOUS | Status: AC
  Administered 2018-04-20: 25 g via INTRAVENOUS
  Filled 2018-04-20: qty 100

## 2018-04-20 NOTE — Progress Notes (Signed)
HD tx initiated via 15Gx2 w/o problem, pull/push/flush well w/o problem, VSS, will cont to monitor while on HD tx 

## 2018-04-20 NOTE — Social Work (Addendum)
CSW spoke with pt at bedside, pt from [facility] ([facility] Pt states no concerns about return to facility, pt states that he will contact his children to let them know when he is back and requests CSW arrange [facility] to take him back to [facility] this afternoon. Pt states he does not want CSW to contact children (son and daughter) because they are currently at work and he doesn't want to frighten them, "I'm not sure they even know I came to the hospital."   Clinical Social Worker facilitated patient discharge including contacting patient family and facility to confirm patient discharge plans.  Clinical information faxed to facility and family agreeable with plan.  CSW arranged ambulance transport via [facility] to [place] @ 1pm. RN to call [phone] with report  prior to discharge.  Clinical Social Worker will sign off for now as social work intervention is no longer needed. Please consult [place] again if new need arises.  Alexander Mt, [place] Social Worker

## 2018-04-20 NOTE — Clinical Social Work Placement (Addendum)
   CLINICAL SOCIAL WORK PLACEMENT  NOTE [facility] (formerly [employer] RN to call report to [phone]  Date:  04/20/2018  Patient Details  Name: Trevor [name] MRN: [id number] Date of Birth: 18-Jan-1946  Clinical Social Work is seeking post-discharge placement for this patient at the [place] level of care (*CSW will initial, date and re-position this form in  chart as items are completed):      Patient/family provided with [facility] [place] Work Department's list of facilities offering this level of care within the geographic area requested by the patient (or if unable, by the patient's family).  Yes   Patient/family informed of their freedom to choose among providers that offer the needed level of care, that participate in Medicare, Medicaid or managed care program needed by the patient, have an available bed and are willing to accept the patient.      Patient/family informed of [facility]'s ownership interest in [facility] and [facility], as well as of the fact that they are under no obligation to receive care at these facilities.  PASRR submitted to EDS on       PASRR number received on       Existing PASRR number confirmed on 04/20/18     FL2 transmitted to all facilities in geographic area requested by pt/family on 04/20/18     FL2 transmitted to all facilities within larger geographic area on       Patient informed that his/her managed care company has contracts with or will negotiate with certain facilities, including the following:        Yes   Patient/family informed of bed offers received.  Patient chooses bed at [facility]     Physician recommends and patient chooses bed at      Patient to be transferred to [name] on 04/20/18.  Patient to be transferred to facility by [facility]     Patient family notified on 04/20/18 of transfer.  Name of  family member notified:  pt states he will call son and daughter requests CSW not call them "theyre at work"     PHYSICIAN       Additional Comment:    _______________________________________________ Alexander Mt, [place] 04/20/2018, 11:19 AM

## 2018-04-20 NOTE — Discharge Summary (Signed)
Physician Discharge Summary  Trevor [name] CBU:[id number] DOB: 1945-12-08 DOA: 04/19/2018  PCP: [name]  Admit date: 04/19/2018 Discharge date: 04/20/2018  Admitted From: [name] Disposition: [name]  Recommendations for Outpatient Follow-up:  1. Follow up with PCP in 1 week 2. Patient should not miss HD sessions 3. Recommend GI follow-up for occult GI bleeding 4. Please follow up on the following pending results: None  Home Health: [name] Equipment/Devices: None  Discharge Condition: Stable CODE STATUS: Full code Diet recommendation: Renal diet   Brief/Interim Summary:  Admission HPI written by Vianne Bulls, MD   Chief Complaint: SOB  HPI: Trevor [name] is a 72 y.o. male with medical history significant for end-stage renal disease on hemodialysis, chronic combined systolic/diastolic CHF, hypertension, history of CVA, and coronary artery disease, now presenting to the emergency department from his nursing home for evaluation of shortness of breath.  Patient was last dialyzed on 03/26/2018 and is due for dialysis today.  He had been in his usual state until yesterday when he noted shortness of breath.  He was experiencing some chest discomfort yesterday, but none today.  He denies any fevers, chills, or significant cough.  Denies dietary indiscretions.  He acutely worsened overnight and EMS was called.  He was saturating 77% on room air upon EMS arrival and was transported to the ED with CPAP.  ED Course: Upon arrival to the ED, patient is found to be afebrile, saturating adequately on BiPAP, and with vitals otherwise normal.  EKG features a sinus tachycardia with rate 104 and LVH with repolarization abnormality and anterior Q waves.  Chest x-ray is concerning for diffuse interstitial edema.  Chemistry panel is notable for sodium 133, potassium 5.8, BUN 50, and glucose 218.  CBC features a leukocytosis to 11,400 and a normocytic anemia with hemoglobin of 11.7, up from 9.6  in February.  Fecal occult blood testing is positive.  Troponin is normal.  Lactic acid is elevated to 5.37.  Patient was placed on BiPAP in the ED, remains hemodynamically stable, nephrology was consulted by the ED physician for dialysis, and the patient will be admitted to the stepdown unit.      Hospital course:  Acute pulmonary edema Acute respiratory failure with hypoxia Secondary to fluid overload from missed hemodialysis session.  Patient initially requiring BiPAP patient underwent.  Urgent hemodialysis with improvement of symptoms and was transitioned eventually to room air.  Patient at baseline transfers from bed to wheelchair.  End-stage renal disease Hyperkalemia Patient underwent hemodialysis.  CAD Patient reported some chest pain that the day prior to admission.  None on admission.  Troponin was obtained and was significant for mild elevation but flat and the setting of ESRD and pulmonary edema.  Likely demand ischemia.  History of CVA Stable.  Patient is on aspirin and Plavix.  Hemoglobin is stable.  Anemia Likely secondary to chronic kidney disease, but patient does have a history of occult GI bleeding.  No current fecal occult blood testing.  Recommend outpatient follow-up.  Essential hypertension Continued home medications.  Patient had some hypotension during dialysis which resolved.  Lactic acidosis This was not present on current admission, but rather, a lab result from 4/29.  Discharge Diagnoses:  Principal Problem:   Acute pulmonary edema (HCC) Active Problems:   History of CVA (cerebrovascular accident)   Chronic combined systolic and diastolic CHF (congestive heart failure) (HCC)   Coronary artery disease, non-occlusive:    End-stage renal disease on hemodialysis ([place])  Chest pain   Essential hypertension   Acute respiratory failure with hypoxia [facility])    Discharge Instructions  Discharge Instructions    Call MD for:  difficulty breathing,  headache or visual disturbances   Complete by:  As directed    Call MD for:  severe uncontrolled pain   Complete by:  As directed    Increase activity slowly   Complete by:  As directed      Allergies as of 04/20/2018   No Known Allergies     Medication List    TAKE these medications   amLODipine 10 MG tablet Commonly known as:  NORVASC Take 10 mg by mouth at bedtime.   aspirin 81 MG chewable tablet Chew 1 tablet (81 mg total) by mouth daily.   atorvastatin 40 MG tablet Commonly known as:  LIPITOR Take 40 mg by mouth at bedtime.   bisacodyl 10 MG suppository Commonly known as:  DULCOLAX Place 10 mg rectally daily as needed (constipation).   cinacalcet 90 MG tablet Commonly known as:  SENSIPAR Take 90 mg by mouth daily with supper.   clopidogrel 75 MG tablet Commonly known as:  PLAVIX Take 1 tablet (75 mg total) by mouth daily.   docusate sodium 100 MG capsule Commonly known as:  COLACE Take 200 mg by mouth at bedtime.   doxercalciferol 4 MCG/2ML injection Commonly known as:  HECTOROL Inject 1 mL (2 mcg total) into the vein every Monday, Wednesday, and Friday with hemodialysis.   feeding supplement (NEPRO CARB STEADY) Liqd Take 237 mLs by mouth 2 (two) times daily between meals.   feeding supplement (PRO-STAT SUGAR FREE 64) Liqd Take 30 mLs by mouth 2 (two) times daily.   ferric citrate 1 GM 210 MG(Fe) tablet Commonly known as:  AURYXIA Take 630 mg by mouth 3 (three) times daily with meals.   fluticasone 50 MCG/ACT nasal spray Commonly known as:  FLONASE Place 1 spray into both nostrils 2 (two) times daily.   gabapentin 300 MG capsule Commonly known as:  NEURONTIN Take 1 capsule (300 mg total) by mouth at bedtime.   guaiFENesin 100 MG/5ML Soln Commonly known as:  ROBITUSSIN Take 10 mLs by mouth every 4 (four) hours as needed for cough.   hydrALAZINE 50 MG tablet Commonly known as:  APRESOLINE Take 50 mg by mouth 3 (three) times daily.   isosorbide  mononitrate 30 MG 24 hr tablet Commonly known as:  IMDUR Take 30 mg by mouth daily.   metoprolol tartrate 25 MG tablet Commonly known as:  LOPRESSOR Take 1 tablet (25 mg total) by mouth 2 (two) times daily.   multivitamin Tabs tablet Take 1 tablet by mouth at bedtime.   multivitamin with minerals Tabs tablet Take 1 tablet by mouth daily.   omega-3 acid ethyl esters 1 g capsule Commonly known as:  LOVAZA Take 1 g by mouth 2 (two) times daily.   phenol 1.4 % Liqd Commonly known as:  CHLORASEPTIC Use as directed 2 sprays in the mouth or throat every 8 (eight) hours as needed for throat irritation / pain.   senna 8.6 MG Tabs tablet Commonly known as:  SENOKOT Take 1 tablet by mouth daily.      Follow-up Information    Clinic, [place]. Schedule an appointment as soon as possible for a visit in 1 week(s).   Contact information: [place] [address] [phone]          No Known Allergies  Consultations:  Nephrology   Procedures/Studies: Dg Chest Port 1 View  Result Date: 04/19/2018 CLINICAL DATA:  Dyspnea EXAM: PORTABLE CHEST 1 VIEW COMPARISON:  03/29/2018 chest radiograph. FINDINGS: Stable cardiomediastinal silhouette with normal heart size. No pneumothorax. No pleural effusion. There is diffuse parahilar interstitial prominence and hazy opacity. IMPRESSION: Diffuse parahilar interstitial prominence and hazy opacity. Normal heart size. Findings are nonspecific and could represent pulmonary edema, with other etiologies including alveolar hemorrhage or atypical infection not excluded. Follow-up chest radiographs recommended to resolution. Electronically Signed   By: Ilona Sorrel M.D.   On: 04/19/2018 18:09   Dg Chest Port 1 View  Result Date: 03/29/2018 CLINICAL DATA:  Increasing cough and shortness of breath tonight. EXAM: PORTABLE CHEST 1 VIEW COMPARISON:  01/11/2018 FINDINGS: Cardiac enlargement. Diffuse interstitial  pattern to the lungs consistent with interstitial edema. No focal consolidation. No pleural effusions. No pneumothorax. Mild cardiac enlargement. Mediastinal contours appear intact. Calcification of the aorta. IMPRESSION: Diffuse interstitial pattern to the lungs consistent with interstitial edema. No consolidation. Aortic atherosclerosis. Electronically Signed   By: Lucienne Capers M.D.   On: 03/29/2018 02:15      Subjective: No chest pain or dyspnea today.  Discharge Exam: Vitals:   04/20/18 0552 04/20/18 0922  BP: 125/64 133/72  Pulse:  97  Resp:  20  Temp: (!) 97.4 F (36.3 C) 98.3 F (36.8 C)  SpO2:  95%   Vitals:   04/20/18 0355 04/20/18 0440 04/20/18 0552 04/20/18 0922  BP: (!) 187/104 137/76 125/64 133/72  Pulse: 93 76  97  Resp: (!) 22 (!) 22  20  Temp:  98.2 F (36.8 C) (!) 97.4 F (36.3 C) 98.3 F (36.8 C)  TempSrc:  Oral Oral Oral  SpO2: 100% 98%  95%  Weight:   57.1 kg (125 lb 14.1 oz)   Height:   5\' 10"  (1.778 m)     General: Pt is alert, awake, not in acute distress Cardiovascular: RRR, S1/S2 +, no rubs, no gallops Respiratory: CTA bilaterally, no wheezing, no rhonchi Abdominal: Soft, NT, ND, bowel sounds + Extremities: no cyanosis    The results of significant diagnostics from this hospitalization (including imaging, microbiology, ancillary and laboratory) are listed below for reference.     Microbiology: Recent Results (from the past 240 hour(s))  MRSA PCR Screening     Status: None   Collection Time: 04/20/18  6:01 AM  Result Value Ref Range Status   MRSA by PCR NEGATIVE NEGATIVE Final    Comment:        The GeneXpert MRSA Assay (FDA approved for NASAL specimens only), is one component of a comprehensive MRSA colonization surveillance program. It is not intended to diagnose MRSA infection nor to guide or monitor treatment for MRSA infections. Performed at [facility], [place] [address] [place], [place] [address]      Labs: BNP  (last 3 results) Recent Labs    06/27/17 0550 01/12/18 0515 03/29/18 0146  BNP >4,500.0* 4,136.0* >6,045.4*   Basic Metabolic Panel: Recent Labs  Lab 04/19/18 1753 04/19/18 1803 04/20/18 0648  NA 134* 133* 139  K 5.0 4.9 3.7  CL 93* 95* 94*  CO2 26  --  28  GLUCOSE 140* 135* 170*  BUN 41* 39* 19  CREATININE 10.18* 9.60* 5.38*  CALCIUM 9.6  --  9.9   Liver Function Tests: Recent Labs  Lab 04/19/18 1753  AST 15  ALT 10*  ALKPHOS 113  BILITOT 1.2  PROT 7.7  ALBUMIN 3.3*  No results for input(s): LIPASE, AMYLASE in the last 168 hours. No results for input(s): AMMONIA in the last 168 hours. CBC: Recent Labs  Lab 04/19/18 1753 04/19/18 1803 04/20/18 0648  WBC 7.1  --  4.1  NEUTROABS 5.1  --   --   HGB 12.7* 13.3 10.7*  HCT 38.9* 39.0 31.1*  MCV 88.6  --  84.5  PLT 198  --  175   Cardiac Enzymes: Recent Labs  Lab 04/19/18 2035 04/20/18 0648  TROPONINI 0.03* 0.04*   BNP: Invalid input(s): POCBNP CBG: No results for input(s): GLUCAP in the last 168 hours. D-Dimer No results for input(s): DDIMER in the last 72 hours. Hgb A1c No results for input(s): HGBA1C in the last 72 hours. Lipid Profile No results for input(s): CHOL, HDL, LDLCALC, TRIG, CHOLHDL, LDLDIRECT in the last 72 hours. Thyroid function studies No results for input(s): TSH, T4TOTAL, T3FREE, THYROIDAB in the last 72 hours.  Invalid input(s): FREET3 Anemia work up No results for input(s): VITAMINB12, FOLATE, FERRITIN, TIBC, IRON, RETICCTPCT in the last 72 hours. Urinalysis    Component Value Date/Time   COLORURINE YELLOW 04/04/2017 1833   APPEARANCEUR CLEAR 04/04/2017 1833   LABSPEC 1.008 04/04/2017 1833   PHURINE 9.0 (H) 04/04/2017 1833   GLUCOSEU 50 (A) 04/04/2017 1833   HGBUR MODERATE (A) 04/04/2017 1833   BILIRUBINUR NEGATIVE 04/04/2017 1833   KETONESUR NEGATIVE 04/04/2017 1833   PROTEINUR 100 (A) 04/04/2017 1833   UROBILINOGEN 0.2 01/08/2015 2116   NITRITE NEGATIVE 04/04/2017  1833   LEUKOCYTESUR NEGATIVE 04/04/2017 1833   Sepsis Labs Invalid input(s): PROCALCITONIN,  WBC,  LACTICIDVEN Microbiology Recent Results (from the past 240 hour(s))  MRSA PCR Screening     Status: None   Collection Time: 04/20/18  6:01 AM  Result Value Ref Range Status   MRSA by PCR NEGATIVE NEGATIVE Final    Comment:        The GeneXpert MRSA Assay (FDA approved for NASAL specimens only), is one component of a comprehensive MRSA colonization surveillance program. It is not intended to diagnose MRSA infection nor to guide or monitor treatment for MRSA infections. Performed at [facility], [place] [address] [place], [place] [address]      SIGNED:   Cordelia Poche, MD [facility] 04/20/2018, 10:41 AM

## 2018-04-20 NOTE — Progress Notes (Signed)
[facility] NEPHROLOGY PROGRESS NOTE  Assessment/ Plan: Pt is a 72 y.o. yo male  ESRD on hemodialysis 3 times weekly at [facility] via AV fistula of the left upper arm presented with hypoxia, acute respiratory failure in the setting of missed dialysis.  Patient received dialysis with ultrafiltration of almost 4 kg.  Assessment/Plan:  #Hypoxia, acute respiratory failure: Improved after dialysis.  Currently on room air.  No shortness of breath. # ESRD: Monday Wednesday Friday.  Next dialysis tomorrow.  Tolerated dialysis last night with ultrafiltration about 4 kg.  Serum potassium level 3.7. # Anemia: Hemoglobin 10.7. # Secondary hyperparathyroidism: Calcium 9.9.  Continue Sensipar, # HTN/volume: Blood pressure acceptable.  Continue current medication. Patient is stable to discharge home.  Next dialysis tomorrow at outpatient center.  Subjective: Seen and examined at bedside.  Denies headache, dizziness, nausea vomiting chest pain.  Feels good after dialysis. Objective Vital signs in last 24 hours: Vitals:   04/20/18 0355 04/20/18 0440 04/20/18 0552 04/20/18 0922  BP: (!) 187/104 137/76 125/64 133/72  Pulse: 93 76  97  Resp: (!) 22 (!) 22  20  Temp:  98.2 F (36.8 C) (!) 97.4 F (36.3 C) 98.3 F (36.8 C)  TempSrc:  Oral Oral Oral  SpO2: 100% 98%  95%  Weight:   57.1 kg (125 lb 14.1 oz)   Height:   5\' 10"  (1.778 m)    Weight change:   Intake/Output Summary (Last 24 hours) at 04/20/2018 1202 Last data filed at 04/20/2018 0900 Gross per 24 hour  Intake 240 ml  Output 4163 ml  Net -3923 ml       Labs: Basic Metabolic Panel: Recent Labs  Lab 04/19/18 1753 04/19/18 1803 04/20/18 0648  NA 134* 133* 139  K 5.0 4.9 3.7  CL 93* 95* 94*  CO2 26  --  28  GLUCOSE 140* 135* 170*  BUN 41* 39* 19  CREATININE 10.18* 9.60* 5.38*  CALCIUM 9.6  --  9.9   Liver Function Tests: Recent Labs  Lab 04/19/18 1753  AST 15  ALT 10*  ALKPHOS 113   BILITOT 1.2  PROT 7.7  ALBUMIN 3.3*   No results for input(s): LIPASE, AMYLASE in the last 168 hours. No results for input(s): AMMONIA in the last 168 hours. CBC: Recent Labs  Lab 04/19/18 1753 04/19/18 1803 04/20/18 0648  WBC 7.1  --  4.1  NEUTROABS 5.1  --   --   HGB 12.7* 13.3 10.7*  HCT 38.9* 39.0 31.1*  MCV 88.6  --  84.5  PLT 198  --  175   Cardiac Enzymes: Recent Labs  Lab 04/19/18 2035 04/20/18 0648  TROPONINI 0.03* 0.04*   CBG: No results for input(s): GLUCAP in the last 168 hours.  Iron Studies: No results for input(s): IRON, TIBC, TRANSFERRIN, FERRITIN in the last 72 hours. Studies/Results: Dg Chest Port 1 View  Result Date: 04/19/2018 CLINICAL DATA:  Dyspnea EXAM: PORTABLE CHEST 1 VIEW COMPARISON:  03/29/2018 chest radiograph. FINDINGS: Stable cardiomediastinal silhouette with normal heart size. No pneumothorax. No pleural effusion. There is diffuse parahilar interstitial prominence and hazy opacity. IMPRESSION: Diffuse parahilar interstitial prominence and hazy opacity. Normal heart size. Findings are nonspecific and could represent pulmonary edema, with other etiologies including alveolar hemorrhage or atypical infection not excluded. Follow-up chest radiographs recommended to resolution. Electronically Signed   By: Ilona Sorrel M.D.   On: 04/19/2018 18:09    Medications: Infusions: . sodium chloride    .  sodium chloride      Scheduled Medications: . amLODipine  10 mg Oral QHS  . aspirin  81 mg Oral Daily  . atorvastatin  40 mg Oral q1800  . cinacalcet  90 mg Oral Q supper  . clopidogrel  75 mg Oral Daily  . docusate sodium  200 mg Oral QHS  . feeding supplement (ENSURE ENLIVE)  237 mL Oral TID BM  . feeding supplement (PRO-STAT SUGAR FREE 64)  30 mL Oral BID  . gabapentin  300 mg Oral QHS  . heparin  5,000 Units Subcutaneous Q8H  . hydrALAZINE  25 mg Oral TID  . isosorbide mononitrate  30 mg Oral Daily  . metoprolol tartrate  25 mg Oral BID  .  multivitamin with minerals  1 tablet Oral Daily  . omega-3 acid ethyl esters  1 g Oral BID  . senna  1 tablet Oral Daily    have reviewed scheduled and prn medications.  Physical Exam: General:NAD, comfortable Heart:RRR, s1s2 nl Lungs:clear b/l, no cracjle Abdomen:soft, Non-tender, non-distended Extremities:No edema Dialysis Access: Left upper extremity AV fistula.  Good thrill and bruit.  Vineeth Fell Tanna Furry 04/20/2018,12:02 PM  LOS: 1 day

## 2018-04-20 NOTE — Progress Notes (Signed)
HD tx completed @ 0355 w/ bp issues throughout tx, UF goal not met, blood rinsed back, VSS, report called to Selena Batten, RN

## 2018-04-20 NOTE — Discharge Instructions (Signed)
Pulmonary Edema Pulmonary edema (PE) is a condition in which fluid collects in the lungs. This makes it hard to breathe. PE may be a result of the heart not pumping very well or a result of injury. What are the causes?  Coronary artery disease causes blockages in the arteries of the heart. This deprives the heart muscle of oxygen and weakens the muscle. A heart attack is a form of coronary artery disease.  High blood pressure causes the heart muscle to work harder than usual. Over time, the heart muscle may get stiff, and it starts to work less efficiently. It may also fatigue and weaken.  Viral infection of the heart (myocarditis) may weaken the heart muscle.  Metabolic conditions such as thyroid disease, excessive alcohol use, certain vitamin deficiencies, or diabetes may also weaken the heart muscle.  Leaky or stiff heart valves may impair normal heart function.  Lung disease may strain the heart muscle.  Excessive demands on the heart such as too much salt or fluid intake.  Failure to take prescribed medicines.  Lung injury from heat or toxins, such as poisonous gas.  Infection in the lungs or other parts of the body.  Fluid overload caused by kidney failure or medicines. What are the signs or symptoms?  Shortness of breath at rest or with exertion.  Grunting, wheezing, or gurgling while breathing.  Feeling like you cannot get enough air.  Breaths are shallow and fast.  A lot of coughing with frothy or bloody mucus.  Skin may become cool, damp, and turn a pale or bluish color. How is this diagnosed? Initial diagnosis may be based on your history, symptoms, and a physical examination. Additional tests for PE may include:  Electrocardiography.  Chest X-ray.  Blood tests.  Stress test.  Ultrasound evaluation of the heart (echocardiography).  Evaluation by a heart doctor (cardiologist).  Test of the heart arteries to look for blockages (angiography).  Check of  blood oxygen.  How is this treated? Treatment of PE will depend on the underlying cause and will focus first on relieving the symptoms.  Extra oxygen to make breathing easier and assist with removing mucus. This may include breathing treatments or a tube into the lungs and a breathing machine.  Medicine to help the body get rid of extra water, usually through an IV tube.  Medicine to help the heart pump better.  If poor heart function is the cause, treatment may include: ? Procedures to open blocked arteries, repair damaged heart valves, or remove some of the damaged heart muscle. ? A pacemaker to help the heart pump with less effort.  Follow these instructions at home:  Your health care provider will help you determine what type of exercise program may be helpful. It is important to maintain strength and increase it if possible. Pace your activities to avoid shortness of breath or chest pain. Rest for at least 1 hour before and after meals. Cardiac rehabilitation programs are available in some locations.  Eat a heart-healthy diet low in salt, saturated fat, and cholesterol. Ask for help with choices.  Make a list of every medicine, vitamin, or herbal supplement you are taking. Keep the list with you at all times. Show it to your health care provider at every visit and before starting a new medicine. Keep the list up to date.  Ask your health care provider or pharmacist to help you write a plan or schedule so that you know things about each medicine such as: ?  Why you are taking it. ? The possible side effects. ? The best time of day to take it. ? Foods to take with it or avoid. ? When to stop taking it.  Record your hospital or clinic weight. When you get home, compare it to your scale and record your weight. Then, weigh yourself first thing in the morning daily, and record the weights. You should weigh yourself every morning after you urinate and before you eat breakfast. Wear the  same amount of clothing each time you weigh yourself. Provide your health care provider with your weight record. Daily weights are important in the early recognition of excess fluid. Tell your health care provider right away if you have gained 3 lb (1.4 kg) in 1 day, 5 lb (2.3 kg) in a week, or as directed by your health care provider. Your medicines may need to be adjusted.  Blood pressure monitoring should be done as often as directed. You can get a home blood pressure cuff at your drugstore. Record these values and bring them with you for your clinic visits. Notify your health care provider if you become dizzy or light-headed when standing up.  If you are currently a smoker, it is time to quit. Nicotine makes your heart work harder and is one of the leading causes of cardiac deaths. Do not use nicotine gum or patches before talking to your doctor.  Make a follow-up appointment with your health care provider as directed.  Ask your health care provider for a copy of your latest heart tracing (ECG) and keep a copy with you at all times. Get help right away if:  You have severe chest pain, especially if the pain is crushing or pressure-like and spreads to the arms, back, neck, or jaw. THIS IS AN EMERGENCY. Do not wait to see if the pain will go away. Call for local emergency medical help. Do not drive yourself to the hospital.  You have sweating, feel sick to your stomach (nauseous), or are experiencing shortness of breath.  Your weight increases by 3 lb (1.4 kg) in 1 day or 5 lb (2.3 kg) in a week.  You notice increasing shortness of breath that is unusual for you. This may happen during rest, sleep, or with activity.  You develop chest pain (angina) or pain that is unusual for you.  You notice more swelling in your hands, feet, ankles, or abdomen.  You notice lasting (persistent) dizziness, blurred vision, headache, or unsteadiness.  You begin to cough up bloody mucus (sputum).  You are  unable to sleep because it is hard to breathe.  You begin to feel a jumping or fluttering sensation (palpitations) in the chest that is unusual for you. This information is not intended to replace advice given to you by your health care provider. Make sure you discuss any questions you have with your health care provider. Document Released: 02/07/2003 Document Revised: 06/06/2016 Document Reviewed: 07/25/2013 Elsevier Interactive Patient Education  2017 [employer].

## 2018-04-20 NOTE — NC FL2 (Signed)
[place] MEDICAID FL2 LEVEL OF CARE SCREENING TOOL     IDENTIFICATION  Patient Name: Trevor [name] Birthdate: 1946-09-23 Sex: male Admission Date (Current Location): 04/19/2018  [facility] and [place] Number:  Herbalist and Address:  [facility], [place] [address], [place], [place] [address]      Provider Number: [id number]  Attending Physician Name and Address:  Mariel Aloe, MD  Relative Name and Phone Number:  Maree Erie, daughter    Current Level of Care: Hospital Recommended Level of Care: [place] Prior Approval Number:    Date Approved/Denied:   PASRR Number: [id number]  Discharge Plan: [name]    Current Diagnoses: Patient Active Problem List   Diagnosis Date Noted  . Acute respiratory failure with hypoxia ([place]) 03/29/2018  . Sleep stage dysfunction 11/10/2017  . 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 Vascular device, implant, or graft complication 50/08/3817  . Peripheral arterial disease ([place]) 10/20/2017  . Non-allergic rhinitis 10/20/2017  . 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 BPH (benign prostatic hyperplasia) 07/07/2017  . At risk for adverse drug reaction 07/02/2017  . Ischemia of extremity 06/28/2017  . Acute pulmonary edema ([place]) 06/27/2017  . 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 Essential hypertension 03/08/2016  . Dyslipidemia 01/12/2016  . Chest pain 08/12/2015  . Accelerated hypertension 08/12/2015  . Carotid stenosis   . Abdominal aortic aneurysm ([place]) 03/06/2015  . Protein-calorie malnutrition, severe ([place]) 05/20/2014  . End-stage renal disease on hemodialysis ([place]) 05/19/2014  . Anemia of renal disease 05/19/2014  . Coronary artery disease, non-occlusive:  09/28/2012  . Secondary hyperparathyroidism ([place]) 09/27/2012  . Non compliance with medical treatment 09/27/2012  . Chronic combined systolic and diastolic CHF (congestive heart failure) ([place]) 10/10/2011  . A-fib ([place]) 10/08/2011  .  History of CVA (cerebrovascular accident) 10/07/2011  . Cocaine abuse ([place]) 10/07/2011  . Tobacco use disorder 08/07/2011  . Bruit 08/07/2011    Orientation RESPIRATION BLADDER Height & Weight     Self, Time, Situation, Place  O2(nasal canula 2L) Continent, External catheter Weight: 125 lb 14.1 oz (57.1 kg) Height:  5\' 10"  (177.8 cm)  BEHAVIORAL SYMPTOMS/MOOD NEUROLOGICAL BOWEL NUTRITION STATUS      Continent Diet(see discharge summary)  AMBULATORY STATUS COMMUNICATION OF NEEDS Skin   Limited Assist Verbally Other (Comment)(AV Fistula L Upper Arm)                       Personal Care Assistance Level of Assistance  Bathing, Feeding, Dressing Bathing Assistance: Limited assistance Feeding assistance: Independent Dressing Assistance: Limited assistance     Functional Limitations Info  Sight, Hearing, Speech Sight Info: Impaired Hearing Info: Impaired Speech Info: Adequate    SPECIAL CARE FACTORS FREQUENCY  PT (By licensed PT), OT (By licensed OT)     PT Frequency: 5x week OT Frequency: 5x week            Contractures Contractures Info: Not present    Additional Factors Info  Code Status, Allergies Code Status Info: Full Code Allergies Info: No Known Allergies           Current Medications (04/20/2018):  This is the current hospital active medication list Current Facility-Administered Medications  Medication Dose Route Frequency Provider Last Rate Last Dose  . 0.9 %  sodium chloride infusion  100 mL Intravenous PRN Pearson Grippe B, MD      . 0.9 %  sodium chloride infusion  100 mL Intravenous PRN [name], MD      .  acetaminophen (TYLENOL) tablet 650 mg  650 mg Oral Q4H PRN Opyd, Ilene Qua, MD      . ALPRAZolam Duanne Moron) tablet 0.25 mg  0.25 mg Oral BID PRN Opyd, Ilene Qua, MD      . amLODipine (NORVASC) tablet 10 mg  10 mg Oral QHS Opyd, Ilene Qua, MD      . aspirin chewable tablet 81 mg  81 mg Oral Daily Opyd, Ilene Qua, MD   81 mg at 04/20/18 0956   . atorvastatin (LIPITOR) tablet 40 mg  40 mg Oral q1800 Opyd, Ilene Qua, MD      . bisacodyl (DULCOLAX) suppository 10 mg  10 mg Rectal Daily PRN Opyd, Ilene Qua, MD      . cinacalcet (SENSIPAR) tablet 90 mg  90 mg Oral Q supper Opyd, Ilene Qua, MD      . clopidogrel (PLAVIX) tablet 75 mg  75 mg Oral Daily Opyd, Ilene Qua, MD   75 mg at 04/20/18 0957  . docusate sodium (COLACE) capsule 200 mg  200 mg Oral QHS Opyd, Ilene Qua, MD   Stopped at 04/19/18 2335  . feeding supplement (ENSURE ENLIVE) (ENSURE ENLIVE) liquid 237 mL  237 mL Oral TID BM Opyd, Ilene Qua, MD   Stopped at 04/19/18 2111  . feeding supplement (PRO-STAT SUGAR FREE 64) liquid 30 mL  30 mL Oral BID Opyd, Ilene Qua, MD   30 mL at 04/20/18 0957  . gabapentin (NEURONTIN) capsule 300 mg  300 mg Oral QHS Opyd, Ilene Qua, MD   Stopped at 04/19/18 2334  . guaiFENesin (ROBITUSSIN) 100 MG/5ML solution 200 mg  10 mL Oral Q4H PRN Opyd, Ilene Qua, MD      . heparin injection 5,000 Units  5,000 Units Subcutaneous Q8H Opyd, Ilene Qua, MD   Stopped at 04/19/18 2335  . hydrALAZINE (APRESOLINE) tablet 25 mg  25 mg Oral TID Vianne Bulls, MD   25 mg at 04/20/18 0956  . isosorbide mononitrate (IMDUR) 24 hr tablet 30 mg  30 mg Oral Daily Opyd, Ilene Qua, MD   30 mg at 04/20/18 0957  . lidocaine (PF) (XYLOCAINE) 1 % injection 5 mL  5 mL Intradermal PRN Pearson Grippe B, MD      . lidocaine-prilocaine (EMLA) cream 1 application  1 application Topical PRN Pearson Grippe B, MD      . metoprolol tartrate (LOPRESSOR) tablet 25 mg  25 mg Oral BID Vianne Bulls, MD   25 mg at 04/20/18 0956  . morphine 4 MG/ML injection 1-3 mg  1-3 mg Intravenous Q4H PRN Opyd, Ilene Qua, MD      . multivitamin with minerals tablet 1 tablet  1 tablet Oral Daily Opyd, Ilene Qua, MD   1 tablet at 04/20/18 0956  . omega-3 acid ethyl esters (LOVAZA) capsule 1 g  1 g Oral BID Opyd, Ilene Qua, MD   1 g at 04/20/18 0956  . ondansetron (ZOFRAN) injection 4 mg  4 mg Intravenous Q6H PRN  Opyd, Ilene Qua, MD      . pentafluoroprop-tetrafluoroeth (GEBAUERS) aerosol 1 application  1 application Topical PRN [name], MD      . senna (SENOKOT) tablet 8.6 mg  1 tablet Oral Daily Opyd, Ilene Qua, MD   8.6 mg at 04/20/18 4540     Discharge Medications: Please see discharge summary for a list of discharge medications.  Relevant Imaging Results:  Relevant Lab Results:   Additional Information [phone]. Dialysis patient MWF - [facility]  [place], [place]

## 2018-04-21 DIAGNOSIS — I429 Cardiomyopathy, unspecified: Secondary | ICD-10-CM | POA: Diagnosis not present

## 2018-04-21 DIAGNOSIS — D631 Anemia in chronic kidney disease: Secondary | ICD-10-CM | POA: Diagnosis not present

## 2018-04-21 DIAGNOSIS — I10 Essential (primary) hypertension: Secondary | ICD-10-CM | POA: Diagnosis not present

## 2018-04-21 DIAGNOSIS — M6281 Muscle weakness (generalized): Secondary | ICD-10-CM | POA: Diagnosis not present

## 2018-04-21 DIAGNOSIS — R488 Other symbolic dysfunctions: Secondary | ICD-10-CM | POA: Diagnosis not present

## 2018-04-21 DIAGNOSIS — N186 End stage renal disease: Secondary | ICD-10-CM | POA: Diagnosis not present

## 2018-04-21 DIAGNOSIS — Z8673 Personal history of transient ischemic attack (TIA), and cerebral infarction without residual deficits: Secondary | ICD-10-CM | POA: Diagnosis not present

## 2018-04-22 DIAGNOSIS — N186 End stage renal disease: Secondary | ICD-10-CM | POA: Diagnosis not present

## 2018-04-22 DIAGNOSIS — Z8673 Personal history of transient ischemic attack (TIA), and cerebral infarction without residual deficits: Secondary | ICD-10-CM | POA: Diagnosis not present

## 2018-04-22 DIAGNOSIS — M6281 Muscle weakness (generalized): Secondary | ICD-10-CM | POA: Diagnosis not present

## 2018-04-22 DIAGNOSIS — R488 Other symbolic dysfunctions: Secondary | ICD-10-CM | POA: Diagnosis not present

## 2018-04-23 DIAGNOSIS — M6281 Muscle weakness (generalized): Secondary | ICD-10-CM | POA: Diagnosis not present

## 2018-04-23 DIAGNOSIS — R488 Other symbolic dysfunctions: Secondary | ICD-10-CM | POA: Diagnosis not present

## 2018-04-23 DIAGNOSIS — N186 End stage renal disease: Secondary | ICD-10-CM | POA: Diagnosis not present

## 2018-04-23 DIAGNOSIS — Z8673 Personal history of transient ischemic attack (TIA), and cerebral infarction without residual deficits: Secondary | ICD-10-CM | POA: Diagnosis not present

## 2018-04-25 DIAGNOSIS — Z8673 Personal history of transient ischemic attack (TIA), and cerebral infarction without residual deficits: Secondary | ICD-10-CM | POA: Diagnosis not present

## 2018-04-25 DIAGNOSIS — M6281 Muscle weakness (generalized): Secondary | ICD-10-CM | POA: Diagnosis not present

## 2018-04-25 DIAGNOSIS — N186 End stage renal disease: Secondary | ICD-10-CM | POA: Diagnosis not present

## 2018-04-25 DIAGNOSIS — R488 Other symbolic dysfunctions: Secondary | ICD-10-CM | POA: Diagnosis not present

## 2018-04-26 DIAGNOSIS — Z8673 Personal history of transient ischemic attack (TIA), and cerebral infarction without residual deficits: Secondary | ICD-10-CM | POA: Diagnosis not present

## 2018-04-26 DIAGNOSIS — R488 Other symbolic dysfunctions: Secondary | ICD-10-CM | POA: Diagnosis not present

## 2018-04-26 DIAGNOSIS — M6281 Muscle weakness (generalized): Secondary | ICD-10-CM | POA: Diagnosis not present

## 2018-04-26 DIAGNOSIS — N186 End stage renal disease: Secondary | ICD-10-CM | POA: Diagnosis not present

## 2018-04-27 DIAGNOSIS — R488 Other symbolic dysfunctions: Secondary | ICD-10-CM | POA: Diagnosis not present

## 2018-04-27 DIAGNOSIS — N186 End stage renal disease: Secondary | ICD-10-CM | POA: Diagnosis not present

## 2018-04-27 DIAGNOSIS — Z8673 Personal history of transient ischemic attack (TIA), and cerebral infarction without residual deficits: Secondary | ICD-10-CM | POA: Diagnosis not present

## 2018-04-27 DIAGNOSIS — M6281 Muscle weakness (generalized): Secondary | ICD-10-CM | POA: Diagnosis not present

## 2018-04-28 DIAGNOSIS — M6281 Muscle weakness (generalized): Secondary | ICD-10-CM | POA: Diagnosis not present

## 2018-04-28 DIAGNOSIS — R488 Other symbolic dysfunctions: Secondary | ICD-10-CM | POA: Diagnosis not present

## 2018-04-28 DIAGNOSIS — N186 End stage renal disease: Secondary | ICD-10-CM | POA: Diagnosis not present

## 2018-04-28 DIAGNOSIS — Z8673 Personal history of transient ischemic attack (TIA), and cerebral infarction without residual deficits: Secondary | ICD-10-CM | POA: Diagnosis not present

## 2018-04-29 DIAGNOSIS — N186 End stage renal disease: Secondary | ICD-10-CM | POA: Diagnosis not present

## 2018-04-29 DIAGNOSIS — R488 Other symbolic dysfunctions: Secondary | ICD-10-CM | POA: Diagnosis not present

## 2018-04-29 DIAGNOSIS — Z8673 Personal history of transient ischemic attack (TIA), and cerebral infarction without residual deficits: Secondary | ICD-10-CM | POA: Diagnosis not present

## 2018-04-29 DIAGNOSIS — M6281 Muscle weakness (generalized): Secondary | ICD-10-CM | POA: Diagnosis not present

## 2018-04-30 DIAGNOSIS — Z8673 Personal history of transient ischemic attack (TIA), and cerebral infarction without residual deficits: Secondary | ICD-10-CM | POA: Diagnosis not present

## 2018-04-30 DIAGNOSIS — M6281 Muscle weakness (generalized): Secondary | ICD-10-CM | POA: Diagnosis not present

## 2018-04-30 DIAGNOSIS — R488 Other symbolic dysfunctions: Secondary | ICD-10-CM | POA: Diagnosis not present

## 2018-04-30 DIAGNOSIS — N186 End stage renal disease: Secondary | ICD-10-CM | POA: Diagnosis not present

## 2018-05-02 DIAGNOSIS — M6281 Muscle weakness (generalized): Secondary | ICD-10-CM | POA: Diagnosis not present

## 2018-05-02 DIAGNOSIS — R488 Other symbolic dysfunctions: Secondary | ICD-10-CM | POA: Diagnosis not present

## 2018-05-02 DIAGNOSIS — Z8673 Personal history of transient ischemic attack (TIA), and cerebral infarction without residual deficits: Secondary | ICD-10-CM | POA: Diagnosis not present

## 2018-05-02 DIAGNOSIS — N186 End stage renal disease: Secondary | ICD-10-CM | POA: Diagnosis not present

## 2018-05-03 DIAGNOSIS — Z8673 Personal history of transient ischemic attack (TIA), and cerebral infarction without residual deficits: Secondary | ICD-10-CM | POA: Diagnosis not present

## 2018-05-03 DIAGNOSIS — M6281 Muscle weakness (generalized): Secondary | ICD-10-CM | POA: Diagnosis not present

## 2018-05-03 DIAGNOSIS — N186 End stage renal disease: Secondary | ICD-10-CM | POA: Diagnosis not present

## 2018-05-03 DIAGNOSIS — R488 Other symbolic dysfunctions: Secondary | ICD-10-CM | POA: Diagnosis not present

## 2018-05-04 DIAGNOSIS — R488 Other symbolic dysfunctions: Secondary | ICD-10-CM | POA: Diagnosis not present

## 2018-05-04 DIAGNOSIS — M6281 Muscle weakness (generalized): Secondary | ICD-10-CM | POA: Diagnosis not present

## 2018-05-04 DIAGNOSIS — Z8673 Personal history of transient ischemic attack (TIA), and cerebral infarction without residual deficits: Secondary | ICD-10-CM | POA: Diagnosis not present

## 2018-05-04 DIAGNOSIS — N186 End stage renal disease: Secondary | ICD-10-CM | POA: Diagnosis not present

## 2018-05-05 DIAGNOSIS — Z8673 Personal history of transient ischemic attack (TIA), and cerebral infarction without residual deficits: Secondary | ICD-10-CM | POA: Diagnosis not present

## 2018-05-05 DIAGNOSIS — M6281 Muscle weakness (generalized): Secondary | ICD-10-CM | POA: Diagnosis not present

## 2018-05-05 DIAGNOSIS — R488 Other symbolic dysfunctions: Secondary | ICD-10-CM | POA: Diagnosis not present

## 2018-05-05 DIAGNOSIS — N186 End stage renal disease: Secondary | ICD-10-CM | POA: Diagnosis not present

## 2018-05-06 DIAGNOSIS — R488 Other symbolic dysfunctions: Secondary | ICD-10-CM | POA: Diagnosis not present

## 2018-05-06 DIAGNOSIS — N186 End stage renal disease: Secondary | ICD-10-CM | POA: Diagnosis not present

## 2018-05-06 DIAGNOSIS — M6281 Muscle weakness (generalized): Secondary | ICD-10-CM | POA: Diagnosis not present

## 2018-05-06 DIAGNOSIS — Z8673 Personal history of transient ischemic attack (TIA), and cerebral infarction without residual deficits: Secondary | ICD-10-CM | POA: Diagnosis not present

## 2018-05-09 DIAGNOSIS — Z8673 Personal history of transient ischemic attack (TIA), and cerebral infarction without residual deficits: Secondary | ICD-10-CM | POA: Diagnosis not present

## 2018-05-09 DIAGNOSIS — R488 Other symbolic dysfunctions: Secondary | ICD-10-CM | POA: Diagnosis not present

## 2018-05-09 DIAGNOSIS — M6281 Muscle weakness (generalized): Secondary | ICD-10-CM | POA: Diagnosis not present

## 2018-05-09 DIAGNOSIS — N186 End stage renal disease: Secondary | ICD-10-CM | POA: Diagnosis not present

## 2018-05-10 DIAGNOSIS — N186 End stage renal disease: Secondary | ICD-10-CM | POA: Diagnosis not present

## 2018-05-10 DIAGNOSIS — Z8673 Personal history of transient ischemic attack (TIA), and cerebral infarction without residual deficits: Secondary | ICD-10-CM | POA: Diagnosis not present

## 2018-05-10 DIAGNOSIS — R488 Other symbolic dysfunctions: Secondary | ICD-10-CM | POA: Diagnosis not present

## 2018-05-10 DIAGNOSIS — M6281 Muscle weakness (generalized): Secondary | ICD-10-CM | POA: Diagnosis not present

## 2018-05-11 ENCOUNTER — Encounter: Payer: Self-pay | Admitting: Physician Assistant

## 2018-05-11 ENCOUNTER — Encounter: Payer: Self-pay | Admitting: *Deleted

## 2018-05-11 ENCOUNTER — Ambulatory Visit (INDEPENDENT_AMBULATORY_CARE_PROVIDER_SITE_OTHER): Payer: Medicare Other | Admitting: Physician Assistant

## 2018-05-11 ENCOUNTER — Other Ambulatory Visit: Payer: Self-pay | Admitting: *Deleted

## 2018-05-11 VITALS — BP 115/58 | HR 66 | Temp 97.2°F | Resp 18 | Ht 70.0 in | Wt 125.0 lb

## 2018-05-11 DIAGNOSIS — Z992 Dependence on renal dialysis: Secondary | ICD-10-CM | POA: Diagnosis not present

## 2018-05-11 DIAGNOSIS — N186 End stage renal disease: Secondary | ICD-10-CM

## 2018-05-11 DIAGNOSIS — I739 Peripheral vascular disease, unspecified: Secondary | ICD-10-CM | POA: Diagnosis not present

## 2018-05-11 DIAGNOSIS — I2510 Atherosclerotic heart disease of native coronary artery without angina pectoris: Secondary | ICD-10-CM

## 2018-05-11 NOTE — Progress Notes (Signed)
Letter of pr-op instructions sent to Trevor [name] in patient's hand marked for nurse.

## 2018-05-11 NOTE — Progress Notes (Signed)
Established Dialysis Access   History of Present Illness   Trevor [name] is a 72 y.o. (1946-02-09) male who presents for re-evaluation of permanent access.  The patient's nephrologist referred him back to our office for evaluation of left brachiocephalic fistula due to ulceration and area that is been frequently stuck as well as for prolonged bleeding after completion of hemodialysis treatment.  He is dialyzing on a Monday Wednesday Friday schedule.  Patient states he has to remind the dialysis staff to avoid the areas of ulceration.    Patient also being followed for peripheral arterial disease with history of emergent right axillofemoral bypass and right to left femoral to femoral bypass by Dr. Donnetta [name] in July 2017.  He also has known infra inguinal arterial disease however denies rest pain or active tissue ischemia at this time.  Patient is seen in a wheelchair today and states he is nonambulatory.  Past medical history significant for CHF and CAD.  Left heart cath catheterization in December 2017 demonstrated noncritical stenosis of circumflex artery which was unchanged from 2015.  Current Outpatient Medications  Medication Sig Dispense Refill  . Amino Acids-Protein Hydrolys (FEEDING SUPPLEMENT, PRO-STAT SUGAR FREE 64,) LIQD Take 30 mLs by mouth 2 (two) times daily.    Marland Kitchen amLODipine (NORVASC) 10 MG tablet Take 10 mg by mouth at bedtime.   0  . aspirin 81 MG chewable tablet Chew 1 tablet (81 mg total) by mouth daily.    Marland Kitchen atorvastatin (LIPITOR) 40 MG tablet Take 40 mg by mouth at bedtime.    . bisacodyl (DULCOLAX) 10 MG suppository Place 10 mg rectally daily as needed (constipation).     . cinacalcet (SENSIPAR) 90 MG tablet Take 90 mg by mouth daily with supper.    . clopidogrel (PLAVIX) 75 MG tablet Take 1 tablet (75 mg total) by mouth daily. 30 tablet 2  . docusate sodium (COLACE) 100 MG capsule Take 200 mg by mouth at bedtime.     Marland Kitchen doxercalciferol (HECTOROL) 4 MCG/2ML injection Inject 1  mL (2 mcg total) into the vein every Monday, Wednesday, and Friday with hemodialysis. 2 mL   . ferric citrate (AURYXIA) 1 GM 210 MG(Fe) tablet Take 630 mg by mouth 3 (three) times daily with meals.     . fluticasone (FLONASE) 50 MCG/ACT nasal spray Place 1 spray into both nostrils 2 (two) times daily.     Marland Kitchen gabapentin (NEURONTIN) 300 MG capsule Take 1 capsule (300 mg total) by mouth at bedtime. 30 capsule 0  . guaiFENesin (ROBITUSSIN) 100 MG/5ML SOLN Take 10 mLs by mouth every 4 (four) hours as needed for cough.     . hydrALAZINE (APRESOLINE) 50 MG tablet Take 50 mg by mouth 3 (three) times daily.    . isosorbide mononitrate (IMDUR) 30 MG 24 hr tablet Take 30 mg by mouth daily.    . metoprolol tartrate (LOPRESSOR) 25 MG tablet Take 1 tablet (25 mg total) by mouth 2 (two) times daily. 60 tablet 5  . Multiple Vitamin (MULTIVITAMIN WITH MINERALS) TABS tablet Take 1 tablet by mouth daily.    . Nutritional Supplements (FEEDING SUPPLEMENT, NEPRO CARB STEADY,) LIQD Take 237 mLs by mouth 2 (two) times daily between meals.  0  . omega-3 acid ethyl esters (LOVAZA) 1 g capsule Take 1 g by mouth 2 (two) times daily.    . phenol (CHLORASEPTIC) 1.4 % LIQD Use as directed 2 sprays in the mouth or throat every 8 (eight) hours as needed for throat  irritation / pain.    Marland Kitchen senna (SENOKOT) 8.6 MG TABS tablet Take 1 tablet by mouth daily.    . multivitamin (RENA-VIT) TABS tablet Take 1 tablet by mouth at bedtime. (Patient not taking: Reported on 04/19/2018) 30 each 0   No current facility-administered medications for this visit.     On ROS today: 10 system ROS negative unless otherwise noted in HPI   Physical Examination   Vitals:   05/11/18 1432  BP: (!) 115/58  Pulse: 66  Resp: 18  Temp: (!) 97.2 F (36.2 C)  TempSrc: Oral  SpO2: 96%  Weight: 125 lb (56.7 kg)  Height: 5' 10"  (1.778 m)   Body mass index is 17.94 kg/m.  General Alert, O x 3, WD, NAD  Pulmonary   Cardiac RRR, Nl S1, S2,    Musculo- skeletal Palpable thrill and audible bruit throughout L arm AV fistula with 2 slightly aneurysmal areas of fistula; area closer to elbow with small 3-34m in diameter circular immobile ulceration; palpable L radial pulse; no palpable pedal pulses however feet are warm to touch with good capillary refill and without ischemic tissue changes; palpable ax-fem graft pulse; palpable fem-fem graft pulse  Neurologic Pain and light touch intact in extremities , Motor exam as listed above       Medical Decision Making   Trevor BARBARis a 72y.o. male who presents with area of ulceration of fistula as well as prolonged bleeding from fistula after cannulation   Ulceration of fistula at risk for bleeding if not avoided during dialysis and/or plicated  Patient also experiencing prolonged bleeding from needle sticks suggesting possible outflow stenosis  Plan will be for plication of L brachiocephalic fistula and fistulogram with possible intervention by Dr. CDonzetta Matterson 05/20/18  Procedure was discussed with the patient including risks and benefits and he is willing to proceed  This will likely be done using MAC with local anesthesia due to cardiopulmonary history  Patent axillofemoral and femorofemoral bypass; no rest pain or tissue loss BLE  We will continue to follow patient for PAD on an as needed basis  MDagoberto Ligas PA-C [facility] GS[facility]ervilleOffice: [phone]

## 2018-05-12 ENCOUNTER — Telehealth: Payer: Self-pay | Admitting: *Deleted

## 2018-05-12 DIAGNOSIS — N186 End stage renal disease: Secondary | ICD-10-CM | POA: Diagnosis not present

## 2018-05-12 DIAGNOSIS — R488 Other symbolic dysfunctions: Secondary | ICD-10-CM | POA: Diagnosis not present

## 2018-05-12 DIAGNOSIS — M6281 Muscle weakness (generalized): Secondary | ICD-10-CM | POA: Diagnosis not present

## 2018-05-12 DIAGNOSIS — Z8673 Personal history of transient ischemic attack (TIA), and cerebral infarction without residual deficits: Secondary | ICD-10-CM | POA: Diagnosis not present

## 2018-05-12 NOTE — Telephone Encounter (Signed)
Call to Nursing home facility([facility]) and spoke with patient's nurse [facility]. Reviewed all pre-op instructions and also faxed letter.

## 2018-05-13 DIAGNOSIS — R488 Other symbolic dysfunctions: Secondary | ICD-10-CM | POA: Diagnosis not present

## 2018-05-13 DIAGNOSIS — N186 End stage renal disease: Secondary | ICD-10-CM | POA: Diagnosis not present

## 2018-05-13 DIAGNOSIS — M6281 Muscle weakness (generalized): Secondary | ICD-10-CM | POA: Diagnosis not present

## 2018-05-13 DIAGNOSIS — Z8673 Personal history of transient ischemic attack (TIA), and cerebral infarction without residual deficits: Secondary | ICD-10-CM | POA: Diagnosis not present

## 2018-05-14 DIAGNOSIS — R488 Other symbolic dysfunctions: Secondary | ICD-10-CM | POA: Diagnosis not present

## 2018-05-14 DIAGNOSIS — M6281 Muscle weakness (generalized): Secondary | ICD-10-CM | POA: Diagnosis not present

## 2018-05-14 DIAGNOSIS — Z8673 Personal history of transient ischemic attack (TIA), and cerebral infarction without residual deficits: Secondary | ICD-10-CM | POA: Diagnosis not present

## 2018-05-14 DIAGNOSIS — N186 End stage renal disease: Secondary | ICD-10-CM | POA: Diagnosis not present

## 2018-05-16 DIAGNOSIS — Z8673 Personal history of transient ischemic attack (TIA), and cerebral infarction without residual deficits: Secondary | ICD-10-CM | POA: Diagnosis not present

## 2018-05-16 DIAGNOSIS — M6281 Muscle weakness (generalized): Secondary | ICD-10-CM | POA: Diagnosis not present

## 2018-05-16 DIAGNOSIS — N186 End stage renal disease: Secondary | ICD-10-CM | POA: Diagnosis not present

## 2018-05-16 DIAGNOSIS — R488 Other symbolic dysfunctions: Secondary | ICD-10-CM | POA: Diagnosis not present

## 2018-05-17 DIAGNOSIS — I429 Cardiomyopathy, unspecified: Secondary | ICD-10-CM | POA: Diagnosis not present

## 2018-05-17 DIAGNOSIS — D631 Anemia in chronic kidney disease: Secondary | ICD-10-CM | POA: Diagnosis not present

## 2018-05-17 DIAGNOSIS — Z8673 Personal history of transient ischemic attack (TIA), and cerebral infarction without residual deficits: Secondary | ICD-10-CM | POA: Diagnosis not present

## 2018-05-17 DIAGNOSIS — M6281 Muscle weakness (generalized): Secondary | ICD-10-CM | POA: Diagnosis not present

## 2018-05-17 DIAGNOSIS — N186 End stage renal disease: Secondary | ICD-10-CM | POA: Diagnosis not present

## 2018-05-17 DIAGNOSIS — I10 Essential (primary) hypertension: Secondary | ICD-10-CM | POA: Diagnosis not present

## 2018-05-17 DIAGNOSIS — R488 Other symbolic dysfunctions: Secondary | ICD-10-CM | POA: Diagnosis not present

## 2018-05-18 DIAGNOSIS — Z8673 Personal history of transient ischemic attack (TIA), and cerebral infarction without residual deficits: Secondary | ICD-10-CM | POA: Diagnosis not present

## 2018-05-18 DIAGNOSIS — M6281 Muscle weakness (generalized): Secondary | ICD-10-CM | POA: Diagnosis not present

## 2018-05-18 DIAGNOSIS — N186 End stage renal disease: Secondary | ICD-10-CM | POA: Diagnosis not present

## 2018-05-18 DIAGNOSIS — R488 Other symbolic dysfunctions: Secondary | ICD-10-CM | POA: Diagnosis not present

## 2018-05-19 ENCOUNTER — Other Ambulatory Visit: Payer: Self-pay

## 2018-05-19 ENCOUNTER — Encounter (HOSPITAL_COMMUNITY): Payer: Self-pay | Admitting: *Deleted

## 2018-05-19 DIAGNOSIS — R488 Other symbolic dysfunctions: Secondary | ICD-10-CM | POA: Diagnosis not present

## 2018-05-19 DIAGNOSIS — Z8673 Personal history of transient ischemic attack (TIA), and cerebral infarction without residual deficits: Secondary | ICD-10-CM | POA: Diagnosis not present

## 2018-05-19 DIAGNOSIS — N186 End stage renal disease: Secondary | ICD-10-CM | POA: Diagnosis not present

## 2018-05-19 DIAGNOSIS — M6281 Muscle weakness (generalized): Secondary | ICD-10-CM | POA: Diagnosis not present

## 2018-05-19 NOTE — Pre-Procedure Instructions (Signed)
   Trevor [name]  05/19/2018    [name] procedure is scheduled on Thursday, 05/20/18 at 10:25 AM.   Report to [facility] Entrance "A" Admitting Office at 8:00 AM.   Call this number if you have problems the morning of surgery: [phone]   Remember:  Patient is not to eat or drink after midnight tonight.   Take these medicines the morning of surgery with A SIP OF WATER: Aspirin, Hydralazine (Apresoline), Metoprolol (Lopressor), Isosorbide mononitrate (Imdur), may use Flonase and Duo-neb  Do NOT smoke 24 hours prior to surgery.    Do not wear jewelry.  Do not wear lotions, powders, cologne or deodorant.  Men may shave face and neck.  Do not bring valuables to the hospital.  [facility] is not responsible for any belongings or valuables.  Contacts, dentures or bridgework may not be worn into surgery.  Leave your suitcase in the car.  After surgery it may be brought to your room.  For patients admitted to the hospital, discharge time will be determined by your treatment team.  If any questions today, call me, Lilia Pro, RN at [phone].

## 2018-05-19 NOTE — Progress Notes (Addendum)
Pt is a resident at [name]. I spoke with [place], LPN for pre-op call. She states pt is alert, oriented and able to speak for himself. She verified pt's medical history. Pt does smoke and she states she will instruct him not to smoke as of now until after surgery. Pt has hx of mild CAD, last cath was 11/01/16. Pt is not diabetic.Pt's last dose of Plavix was 05/16/18.

## 2018-05-20 ENCOUNTER — Ambulatory Visit (HOSPITAL_COMMUNITY): Payer: Medicare Other | Admitting: Certified Registered Nurse Anesthetist

## 2018-05-20 ENCOUNTER — Telehealth: Payer: Self-pay | Admitting: Vascular Surgery

## 2018-05-20 ENCOUNTER — Ambulatory Visit (HOSPITAL_COMMUNITY)
Admission: RE | Admit: 2018-05-20 | Discharge: 2018-05-20 | Disposition: A | Discharge status: Other Acute Inpatient Hospital | Payer: Medicare Other | Source: Ambulatory Visit | Attending: Vascular Surgery | Admitting: Vascular Surgery

## 2018-05-20 ENCOUNTER — Encounter (HOSPITAL_COMMUNITY): Payer: Self-pay | Admitting: *Deleted

## 2018-05-20 ENCOUNTER — Encounter (HOSPITAL_COMMUNITY)
Admission: RE | Disposition: A | Discharge status: Nursing Home (Includes ICF) | Payer: Self-pay | Source: Ambulatory Visit | Attending: Vascular Surgery

## 2018-05-20 DIAGNOSIS — D631 Anemia in chronic kidney disease: Secondary | ICD-10-CM | POA: Diagnosis not present

## 2018-05-20 DIAGNOSIS — T82858A Stenosis of vascular prosthetic devices, implants and grafts, initial encounter: Secondary | ICD-10-CM | POA: Diagnosis not present

## 2018-05-20 DIAGNOSIS — F17210 Nicotine dependence, cigarettes, uncomplicated: Secondary | ICD-10-CM | POA: Diagnosis not present

## 2018-05-20 DIAGNOSIS — T82898A Other specified complication of vascular prosthetic devices, implants and grafts, initial encounter: Secondary | ICD-10-CM | POA: Diagnosis not present

## 2018-05-20 DIAGNOSIS — Y832 Surgical operation with anastomosis, bypass or graft as the cause of abnormal reaction of the patient, or of later complication, without mention of misadventure at the time of the procedure: Secondary | ICD-10-CM | POA: Diagnosis not present

## 2018-05-20 DIAGNOSIS — Z992 Dependence on renal dialysis: Secondary | ICD-10-CM

## 2018-05-20 DIAGNOSIS — N185 Chronic kidney disease, stage 5: Secondary | ICD-10-CM | POA: Diagnosis not present

## 2018-05-20 DIAGNOSIS — M6281 Muscle weakness (generalized): Secondary | ICD-10-CM | POA: Diagnosis not present

## 2018-05-20 DIAGNOSIS — N186 End stage renal disease: Secondary | ICD-10-CM

## 2018-05-20 DIAGNOSIS — Z8673 Personal history of transient ischemic attack (TIA), and cerebral infarction without residual deficits: Secondary | ICD-10-CM | POA: Diagnosis not present

## 2018-05-20 DIAGNOSIS — I120 Hypertensive chronic kidney disease with stage 5 chronic kidney disease or end stage renal disease: Secondary | ICD-10-CM | POA: Insufficient documentation

## 2018-05-20 DIAGNOSIS — R488 Other symbolic dysfunctions: Secondary | ICD-10-CM | POA: Diagnosis not present

## 2018-05-20 HISTORY — PX: FISTULOGRAM: SHX5832

## 2018-05-20 HISTORY — DX: Atherosclerotic heart disease of native coronary artery without angina pectoris: I25.10

## 2018-05-20 HISTORY — PX: FISTULA SUPERFICIALIZATION: SHX6341

## 2018-05-20 HISTORY — DX: Anemia, unspecified: D64.9

## 2018-05-20 LAB — POCT I-STAT 4, (NA,K, GLUC, HGB,HCT)
Glucose, Bld: 82 mg/dL (ref 65–99)
HCT: 33 % — ABNORMAL LOW (ref 39.0–52.0)
Hemoglobin: 11.2 g/dL — ABNORMAL LOW (ref 13.0–17.0)
Potassium: 4 mmol/L (ref 3.5–5.1)
Sodium: 135 mmol/L (ref 135–145)

## 2018-05-20 SURGERY — FISTULA SUPERFICIALIZATION
Anesthesia: General | Laterality: Left

## 2018-05-20 MED ORDER — EPHEDRINE SULFATE 50 MG/ML IJ SOLN
INTRAMUSCULAR | Status: AC
  Filled 2018-05-20: qty 1

## 2018-05-20 MED ORDER — ONDANSETRON HCL 4 MG/2ML IJ SOLN
INTRAMUSCULAR | Status: AC
  Filled 2018-05-20: qty 2

## 2018-05-20 MED ORDER — CEFAZOLIN SODIUM-DEXTROSE 2-4 GM/100ML-% IV SOLN
2.0000 g | INTRAVENOUS | Status: DC
  Filled 2018-05-20: qty 100

## 2018-05-20 MED ORDER — HEPARIN SODIUM (PORCINE) 1000 UNIT/ML IJ SOLN
INTRAMUSCULAR | Status: AC
  Filled 2018-05-20: qty 1

## 2018-05-20 MED ORDER — FENTANYL CITRATE (PF) 100 MCG/2ML IJ SOLN: 25.0000 ug | INTRAMUSCULAR | Status: DC | PRN

## 2018-05-20 MED ORDER — IODIXANOL 320 MG/ML IV SOLN
INTRAVENOUS | Status: DC | PRN
  Administered 2018-05-20: 20 mL via INTRAVENOUS

## 2018-05-20 MED ORDER — OXYCODONE HCL 5 MG/5ML PO SOLN: 5.0000 mg | Freq: Once | ORAL | Status: DC | PRN

## 2018-05-20 MED ORDER — ROCURONIUM BROMIDE 50 MG/5ML IV SOLN
INTRAVENOUS | Status: AC
  Filled 2018-05-20: qty 1

## 2018-05-20 MED ORDER — PHENYLEPHRINE 40 MCG/ML (10ML) SYRINGE FOR IV PUSH (FOR BLOOD PRESSURE SUPPORT)
PREFILLED_SYRINGE | INTRAVENOUS | Status: DC | PRN
  Administered 2018-05-20 (×5): 80 ug via INTRAVENOUS

## 2018-05-20 MED ORDER — PHENYLEPHRINE 40 MCG/ML (10ML) SYRINGE FOR IV PUSH (FOR BLOOD PRESSURE SUPPORT)
PREFILLED_SYRINGE | INTRAVENOUS | Status: AC
  Filled 2018-05-20: qty 10

## 2018-05-20 MED ORDER — PROPOFOL 10 MG/ML IV BOLUS
INTRAVENOUS | Status: DC | PRN
  Administered 2018-05-20: 20 mg via INTRAVENOUS

## 2018-05-20 MED ORDER — PHENYLEPHRINE 40 MCG/ML (10ML) SYRINGE FOR IV PUSH (FOR BLOOD PRESSURE SUPPORT)
PREFILLED_SYRINGE | INTRAVENOUS | Status: AC
  Filled 2018-05-20: qty 20

## 2018-05-20 MED ORDER — CHLORHEXIDINE GLUCONATE 4 % EX LIQD: 60.0000 mL | Freq: Once | CUTANEOUS | Status: DC

## 2018-05-20 MED ORDER — FENTANYL CITRATE (PF) 250 MCG/5ML IJ SOLN
INTRAMUSCULAR | Status: AC
  Filled 2018-05-20: qty 5

## 2018-05-20 MED ORDER — PROPOFOL 10 MG/ML IV BOLUS
INTRAVENOUS | Status: AC
  Filled 2018-05-20: qty 20

## 2018-05-20 MED ORDER — OXYCODONE-ACETAMINOPHEN 5-325 MG PO TABS: 1.0000 | ORAL_TABLET | Freq: Four times a day (QID) | ORAL | 0 refills | Status: DC | PRN

## 2018-05-20 MED ORDER — SODIUM CHLORIDE 0.9 % IV SOLN
INTRAVENOUS | Status: AC
  Filled 2018-05-20: qty 1.2

## 2018-05-20 MED ORDER — FENTANYL CITRATE (PF) 100 MCG/2ML IJ SOLN
INTRAMUSCULAR | Status: DC | PRN
  Administered 2018-05-20 (×2): 25 ug via INTRAVENOUS

## 2018-05-20 MED ORDER — SODIUM CHLORIDE 0.9 % IV SOLN
INTRAVENOUS | Status: DC | PRN
  Administered 2018-05-20: 11:00:00

## 2018-05-20 MED ORDER — 0.9 % SODIUM CHLORIDE (POUR BTL) OPTIME
TOPICAL | Status: DC | PRN
  Administered 2018-05-20: 1000 mL

## 2018-05-20 MED ORDER — SODIUM CHLORIDE 0.9 % IV SOLN
INTRAVENOUS | Status: DC
  Administered 2018-05-20 (×2): via INTRAVENOUS

## 2018-05-20 MED ORDER — ONDANSETRON HCL 4 MG/2ML IJ SOLN: 4.0000 mg | Freq: Once | INTRAMUSCULAR | Status: DC | PRN

## 2018-05-20 MED ORDER — LIDOCAINE-EPINEPHRINE (PF) 1 %-1:200000 IJ SOLN
INTRAMUSCULAR | Status: DC | PRN
  Administered 2018-05-20: 30 mL

## 2018-05-20 MED ORDER — LIDOCAINE HCL (PF) 1 % IJ SOLN
INTRAMUSCULAR | Status: AC
  Filled 2018-05-20: qty 30

## 2018-05-20 MED ORDER — CHLORHEXIDINE GLUCONATE 4 % EX LIQD
60.0000 mL | Freq: Once | CUTANEOUS | Status: DC
Start: 2018-05-21 — End: 2018-05-20

## 2018-05-20 MED ORDER — PROPOFOL 500 MG/50ML IV EMUL
INTRAVENOUS | Status: DC | PRN
  Administered 2018-05-20: 75 ug/kg/min via INTRAVENOUS

## 2018-05-20 MED ORDER — ONDANSETRON HCL 4 MG/2ML IJ SOLN
INTRAMUSCULAR | Status: DC | PRN
  Administered 2018-05-20: 4 mg via INTRAVENOUS

## 2018-05-20 MED ORDER — OXYCODONE HCL 5 MG PO TABS: 5.0000 mg | ORAL_TABLET | Freq: Once | ORAL | Status: DC | PRN

## 2018-05-20 MED ORDER — LIDOCAINE 2% (20 MG/ML) 5 ML SYRINGE
INTRAMUSCULAR | Status: AC
  Filled 2018-05-20: qty 10

## 2018-05-20 SURGICAL SUPPLY — 42 items
ARMBAND PINK RESTRICT EXTREMIT (MISCELLANEOUS) ×3 IMPLANT
BALLN MUSTANG 10X80X75 (BALLOONS) ×3
BALLN MUSTANG 9X20X75 (BALLOONS) ×3
BALLOON MUSTANG 10X80X75 (BALLOONS) ×1 IMPLANT
BALLOON MUSTANG 9X20X75 (BALLOONS) ×1 IMPLANT
CANISTER SUCT 3000ML PPV (MISCELLANEOUS) ×3 IMPLANT
CLIP VESOCCLUDE MED 6/CT (CLIP) ×3 IMPLANT
CLIP VESOCCLUDE SM WIDE 6/CT (CLIP) ×3 IMPLANT
COVER DOME SNAP 22 D (MISCELLANEOUS) ×3 IMPLANT
COVER PROBE W GEL 5X96 (DRAPES) ×3 IMPLANT
DERMABOND ADVANCED (GAUZE/BANDAGES/DRESSINGS) ×2
DERMABOND ADVANCED .7 DNX12 (GAUZE/BANDAGES/DRESSINGS) ×1 IMPLANT
DRAPE TABLE COVER HEAVY DUTY (DRAPES) ×3 IMPLANT
DRSG COVADERM 4X6 (GAUZE/BANDAGES/DRESSINGS) ×3 IMPLANT
ELECT REM PT RETURN 9FT ADLT (ELECTROSURGICAL) ×3
ELECTRODE REM PT RTRN 9FT ADLT (ELECTROSURGICAL) ×1 IMPLANT
GLOVE BIO SURGEON STRL SZ7.5 (GLOVE) ×3 IMPLANT
GOWN STRL REUS W/ TWL LRG LVL3 (GOWN DISPOSABLE) ×2 IMPLANT
GOWN STRL REUS W/ TWL XL LVL3 (GOWN DISPOSABLE) ×1 IMPLANT
GOWN STRL REUS W/TWL LRG LVL3 (GOWN DISPOSABLE) ×4
GOWN STRL REUS W/TWL XL LVL3 (GOWN DISPOSABLE) ×2
INTRODUCER AVANTI 6FR (MISCELLANEOUS) ×3 IMPLANT
KIT BASIN OR (CUSTOM PROCEDURE TRAY) ×3 IMPLANT
KIT ENCORE 26 ADVANTAGE (KITS) ×3 IMPLANT
KIT TURNOVER KIT B (KITS) ×3 IMPLANT
NS IRRIG 1000ML POUR BTL (IV SOLUTION) ×3 IMPLANT
PACK CV ACCESS (CUSTOM PROCEDURE TRAY) ×3 IMPLANT
PAD ARMBOARD 7.5X6 YLW CONV (MISCELLANEOUS) ×6 IMPLANT
SET MICROPUNCTURE 5F STIFF (MISCELLANEOUS) ×3 IMPLANT
STAPLER VISISTAT 35W (STAPLE) ×3 IMPLANT
STOPCOCK MORSE 400PSI 3WAY (MISCELLANEOUS) ×3 IMPLANT
SUT MNCRL AB 4-0 PS2 18 (SUTURE) ×3 IMPLANT
SUT PROLENE 5 0 C 1 24 (SUTURE) ×6 IMPLANT
SUT PROLENE 6 0 BV (SUTURE) ×3 IMPLANT
SUT VIC AB 3-0 SH 27 (SUTURE) ×2
SUT VIC AB 3-0 SH 27X BRD (SUTURE) ×1 IMPLANT
SYR 20CC LL (SYRINGE) ×3 IMPLANT
TOWEL GREEN STERILE (TOWEL DISPOSABLE) ×3 IMPLANT
TUBING CIL FLEX 10 FLL-RA (TUBING) ×3 IMPLANT
UNDERPAD 30X30 (UNDERPADS AND DIAPERS) ×3 IMPLANT
WATER STERILE IRR 1000ML POUR (IV SOLUTION) ×3 IMPLANT
WIRE BENTSON .035X145CM (WIRE) ×3 IMPLANT

## 2018-05-20 NOTE — Telephone Encounter (Signed)
[facility]ch appt pt has dialysis on MD days 06/15/18 1pm staple removal

## 2018-05-20 NOTE — Transfer of Care (Signed)
Immediate Anesthesia Transfer of Care Note  Patient: Trevor [name]  Procedure(s) Performed: PLICATION OF ARTERIOVENOUS FISTULA LEFT ARM (Left ) FISTULOGRAM WITH BALLOON ANGIOPLASTY LEFT ARM ARTERIOVENOUS FISTULA (Left )  Patient Location: PACU  Anesthesia Type:MAC  Level of Consciousness: awake  Airway & Oxygen Therapy: Patient Spontanous Breathing and Patient connected to nasal cannula oxygen  Post-op Assessment: Report given to RN and Post -op Vital signs reviewed and stable  Post vital signs: Reviewed and stable  Last Vitals:  Vitals Value Taken Time  BP 85/47 05/20/2018 12:23 PM  Temp    Pulse 59 05/20/2018 12:25 PM  Resp 12 05/20/2018 12:25 PM  SpO2 100 % 05/20/2018 12:25 PM  Vitals shown include unvalidated device data.  Last Pain:  Vitals:   05/20/18 0901  PainSc: 0-No pain      Patients Stated Pain Goal: 3 (02/18/21 4825)  Complications: No apparent anesthesia complications

## 2018-05-20 NOTE — Op Note (Signed)
    Patient name: Trevor [name] MRN: [id number] DOB: January 10, 1946 Sex: male  05/20/2018 Pre-operative Diagnosis: End-stage renal disease, ulceration of left arm AV fistula Post-operative diagnosis:  Same Surgeon:  Eda Paschal. Donzetta Matters, MD Assistant: Leontine Locket, PA Procedure Performed: 1.  Plication of left upper arm AV fistula 2.  Left arm fistulogram 3.  Balloon angioplasty left axillary vein with 10 mm balloon  Indications: 72 year old male history end-stage renal disease on dialysis via left arm AV fistula.  He recently has had prolonged bleeding and has ulceration of the fistula is indicated for revision and fistula gram with possible intervention.    Findings: Skin was tightly fused to the fistula this was excised fistulas primarily plicated.  On fistulogram there was a tight stenosis in the axillary vein as balloon angioplasty with both 9 and 10 mm balloon and there was a strong thrill at completion.   Procedure:  The patient was identified in the holding area and taken to the operating room where is placed supine the operative table and MAC anesthesia was induced.  He was sterilely prepped and draped in the left upper extremity usual fashion given antibiotics and a timeout was called.  We began with elliptical incision around the ulcerated aspect of the fistula overlying 1 of the pseudoaneurysms.  Unfortunately we got into the fistula because it was so tightly fused with the skin and pressure was held both the inflow and outflow.  I then quickly dissected out the fistula and got a clamp proximally and distally.  I then excised the unhealthy skin and area of the fistula and then I flushed heparinized saline in both directions of the fistula.  I then primarily suture the fistula to plicate it with a running mattress suture.  Following this there was return of thrill although there was some pulsatility as well in the fistula.  I then cannulated it near the antecubitum with a micropuncture needle  and placed the wire and sheath.  Fistulogram demonstrated a stenosis in the axillary vein.  I then placed a Bentson wire across the stenosis exchanged for 6 French sheath and first balloon Angie plasty with a 10 mm balloon.  There was a tight waist that I could not pass.  I used a short 9 mm balloon was able to get full apposition of the balloon.  With this I had return of a thrill in the fistula.  There is possibly a small dissection in the fistula itself.  I was satisfied with the runoff and I removed the sheath and used a 5-0 Prolene to close the fistulotomy site.  I then irrigated the wound and closed a layer over the fistula with interrupted 3-0 Vicryl.  Staples were placed to the level of the skin to prevent sticking prior to  full healing.  He was then allowed away from anesthesia having tolerated procedure without immediate comp occasion.  All counts were correct at completion.  Next  EBL 50 cc.    Belton Peplinski C. Donzetta Matters, MD [facility] [place] Office: [phone] Pager: [phone]

## 2018-05-20 NOTE — Anesthesia Preprocedure Evaluation (Signed)
Anesthesia Evaluation  Patient identified by MRN, date of birth, ID band Patient awake    Reviewed: Allergy & Precautions, NPO status , Patient's Chart, lab work & pertinent test results  Airway Mallampati: II  TM Distance: >3 FB Neck ROM: Full    Dental  (+) Edentulous Upper, Edentulous Lower, Dental Advisory Given   Pulmonary Current Smoker,    breath sounds clear to auscultation       Cardiovascular hypertension,  Rhythm:Regular Rate:Normal     Neuro/Psych    GI/Hepatic   Endo/Other    Renal/GU      Musculoskeletal   Abdominal   Peds  Hematology   Anesthesia Other Findings   Reproductive/Obstetrics                             Anesthesia Physical Anesthesia Plan  ASA: III  Anesthesia Plan: General   Post-op Pain Management:    Induction: Intravenous  PONV Risk Score and Plan: Ondansetron and Dexamethasone  Airway Management Planned: Natural Airway and Simple Face Mask  Additional Equipment:   Intra-op Plan:   Post-operative Plan:   Informed Consent: I have reviewed the patients History and Physical, chart, labs and discussed the procedure including the risks, benefits and alternatives for the proposed anesthesia with the patient or authorized representative who has indicated his/her understanding and acceptance.     Plan Discussed with: CRNA and Anesthesiologist  Anesthesia Plan Comments:         Anesthesia Quick Evaluation

## 2018-05-20 NOTE — Anesthesia Postprocedure Evaluation (Signed)
Anesthesia Post Note  Patient: Trevor [name]  Procedure(s) Performed: PLICATION OF ARTERIOVENOUS FISTULA LEFT ARM (Left ) FISTULOGRAM WITH BALLOON ANGIOPLASTY LEFT ARM ARTERIOVENOUS FISTULA (Left )     Patient location during evaluation: PACU Anesthesia Type: General Level of consciousness: awake and alert Pain management: pain level controlled Vital Signs Assessment: post-procedure vital signs reviewed and stable Respiratory status: spontaneous breathing, nonlabored ventilation, respiratory function stable and patient connected to nasal cannula oxygen Cardiovascular status: stable and blood pressure returned to baseline Postop Assessment: no apparent nausea or vomiting Anesthetic complications: no    Last Vitals:  Vitals:   05/20/18 1252 05/20/18 1307  BP: 133/64 130/63  Pulse: 61 71  Resp: 14 17  Temp:  36.6 C  SpO2: 99% 100%    Last Pain:  Vitals:   05/20/18 1307  PainSc: 0-No pain                 Raymar Joiner COKER

## 2018-05-20 NOTE — Discharge Instructions (Signed)
[facility]  Discharge Instructions  AV Fistula or Graft Surgery for Dialysis Access  Please refer to the following instructions for your post-procedure care. Your surgeon or physician assistant will discuss any changes with you.  Activity  You may drive the day following your surgery, if you are comfortable and no longer taking prescription pain medication. Resume full activity as the soreness in your incision resolves.  Bathing/Showering  You may shower after you go home. Keep your incision dry for 48 hours. Do not soak in a bathtub, hot tub, or swim until the incision heals completely. You may not shower if you have a hemodialysis catheter.  Incision Care  Clean your incision with mild soap and water after 48 hours. Pat the area dry with a clean towel. You do not need a bandage unless otherwise instructed. Do not apply any ointments or creams to your incision. You may have skin glue on your incision. Do not peel it off. It will come off on its own in about one week. Your arm may swell a bit after surgery. To reduce swelling use pillows to elevate your arm so it is above your heart. Your doctor will tell you if you need to lightly wrap your arm with an ACE bandage.  Diet  Resume your normal diet. There are not special food restrictions following this procedure. In order to heal from your surgery, it is CRITICAL to get adequate nutrition. Your body requires vitamins, minerals, and protein. Vegetables are the best source of vitamins and minerals. Vegetables also provide the perfect balance of protein. Processed food has little nutritional value, so try to avoid this.  Medications  Resume taking all of your medications. If your incision is causing pain, you may take over-the counter pain relievers such as acetaminophen (Tylenol). If you were prescribed a stronger pain medication, please be aware these medications can cause nausea and constipation. Prevent  nausea by taking the medication with a snack or meal. Avoid constipation by drinking plenty of fluids and eating foods with high amount of fiber, such as fruits, vegetables, and grains.  Do not take Tylenol if you are taking prescription pain medications.  Follow up Your surgeon may want to see you in the office following your access surgery. If so, this will be arranged at the time of your surgery.  Please call us immediately for any of the following conditions:  Increased pain, redness, drainage (pus) from your incision site Fever of 101 degrees or higher Severe or worsening pain at your incision site Hand pain or numbness.  Reduce your risk of vascular disease:  Stop smoking. If you would like help, call [facility] at 1-800-QUIT-NOW 614 150 6226) or [place] at [place] your cholesterol Maintain a desired weight Control your diabetes Keep your blood pressure down  Dialysis  It will take several weeks to several months for your new dialysis access to be ready for use. Your surgeon will determine when it is okay to use it. Your nephrologist will continue to direct your dialysis. You can continue to use your Permcath until your new access is ready for use.   05/20/2018 Trevor [name] [id number] January 13, 1946  Surgeon(s): Trevor Sandy, MD  Procedure(s): PLICATION OF ARTERIOVENOUS FISTULA LEFT ARM FISTULOGRAM WITH BALLOON ANGIOPLASTY LEFT ARM ARTERIOVENOUS FISTULA  x May stick graft on designated area only:  Do NOT stick over incision for 6 weeks. May stick above and below incision now. SEE DIAGRAM.   If you  have any questions, please call the office at [phone].

## 2018-05-20 NOTE — H&P (Signed)
   History and Physical Update  The patient was interviewed and re-examined.  The patient's previous History and Physical has been reviewed and is unchanged from recent office visit. Plan for left arm fistula revision and fistulogram today.  Trevor [name] C. Donzetta Matters, MD [facility] [place] Office: [phone] Pager: [phone]   05/20/2018, 9:55 AM

## 2018-05-21 ENCOUNTER — Encounter (HOSPITAL_COMMUNITY): Payer: Self-pay | Admitting: Vascular Surgery

## 2018-05-21 DIAGNOSIS — M6281 Muscle weakness (generalized): Secondary | ICD-10-CM | POA: Diagnosis not present

## 2018-05-21 DIAGNOSIS — R488 Other symbolic dysfunctions: Secondary | ICD-10-CM | POA: Diagnosis not present

## 2018-05-21 DIAGNOSIS — N186 End stage renal disease: Secondary | ICD-10-CM | POA: Diagnosis not present

## 2018-05-21 DIAGNOSIS — Z8673 Personal history of transient ischemic attack (TIA), and cerebral infarction without residual deficits: Secondary | ICD-10-CM | POA: Diagnosis not present

## 2018-05-27 ENCOUNTER — Ambulatory Visit: Payer: Medicare Other

## 2018-05-27 DIAGNOSIS — D631 Anemia in chronic kidney disease: Secondary | ICD-10-CM | POA: Diagnosis not present

## 2018-05-27 DIAGNOSIS — N186 End stage renal disease: Secondary | ICD-10-CM | POA: Diagnosis not present

## 2018-05-27 DIAGNOSIS — I10 Essential (primary) hypertension: Secondary | ICD-10-CM | POA: Diagnosis not present

## 2018-05-28 DIAGNOSIS — F4320 Adjustment disorder, unspecified: Secondary | ICD-10-CM | POA: Diagnosis not present

## 2018-06-14 DIAGNOSIS — I429 Cardiomyopathy, unspecified: Secondary | ICD-10-CM | POA: Diagnosis not present

## 2018-06-14 DIAGNOSIS — D631 Anemia in chronic kidney disease: Secondary | ICD-10-CM | POA: Diagnosis not present

## 2018-06-14 DIAGNOSIS — I10 Essential (primary) hypertension: Secondary | ICD-10-CM | POA: Diagnosis not present

## 2018-06-14 DIAGNOSIS — N186 End stage renal disease: Secondary | ICD-10-CM | POA: Diagnosis not present

## 2018-06-15 ENCOUNTER — Encounter: Payer: Self-pay | Admitting: Physician Assistant

## 2018-06-15 ENCOUNTER — Other Ambulatory Visit: Payer: Self-pay

## 2018-06-15 ENCOUNTER — Ambulatory Visit (INDEPENDENT_AMBULATORY_CARE_PROVIDER_SITE_OTHER): Payer: Self-pay | Admitting: Physician Assistant

## 2018-06-15 VITALS — BP 119/68 | HR 65 | Temp 97.5°F | Resp 16 | Ht 70.0 in | Wt 125.0 lb

## 2018-06-15 DIAGNOSIS — D649 Anemia, unspecified: Secondary | ICD-10-CM | POA: Diagnosis not present

## 2018-06-15 DIAGNOSIS — N186 End stage renal disease: Secondary | ICD-10-CM

## 2018-06-15 DIAGNOSIS — Z992 Dependence on renal dialysis: Secondary | ICD-10-CM

## 2018-06-15 DIAGNOSIS — Z79899 Other long term (current) drug therapy: Secondary | ICD-10-CM | POA: Diagnosis not present

## 2018-06-15 DIAGNOSIS — E785 Hyperlipidemia, unspecified: Secondary | ICD-10-CM | POA: Diagnosis not present

## 2018-06-15 NOTE — Progress Notes (Signed)
    Postoperative Access Visit   History of Present Illness   Trevor [name] is a 72 y.o. year old male who presents for postoperative follow-up for plication of left arm brachiocephalic fistula as well as fistulogram with balloon angioplasty of left axillary vein by Dr. Donzetta Matters on 05/20/2018.  He continues to dialyze from left arm brachiocephalic fistula on a Monday Wednesday Friday schedule.  Since surgery the area of staples have been avoided during hemodialysis.  Patient states he is no longer experiencing prolonged bleeding after hemodialysis treatments since surgery.  Patient believes incision of his left arm healed well.  He denies steal symptoms.  Physical Examination   Vitals:   06/15/18 1320  BP: 119/68  Pulse: 65  Resp: 16  Temp: (!) 97.5 F (36.4 C)  TempSrc: Oral  SpO2: 96%  Weight: 125 lb (56.7 kg)  Height: 5\' 10"  (1.778 m)   Body mass index is 17.94 kg/m.  left arm Incision is healed, hand grip is 5/5, sensation in digits is intact, palpable thrill, bruit can be auscultated; palpable L radial pulse; some skin degeneration in mid upper arm, no ulceration     Medical Decision Making   Trevor [name] is a 72 y.o. year old male who presents s/p  plication of left arm brachiocephalic fistula as well as fistulogram with balloon angioplasty of left axillary vein by Dr. Donzetta Matters on 05/20/2018   Staples removed in office today  Patent fistula with palpable thrill and audible bruit in upper L arm  Ok to stick area of incision now that we are 4 weeks post op  Continue to avoid separate area of skin discoloration cephalad to surgical site; no indication for plication at this time however patient will require plication if area worsens  He may follow up prn   Trevor Ligas PA-C [facility] [place] Office: [phone]

## 2018-06-24 DIAGNOSIS — I10 Essential (primary) hypertension: Secondary | ICD-10-CM | POA: Diagnosis not present

## 2018-06-24 DIAGNOSIS — I429 Cardiomyopathy, unspecified: Secondary | ICD-10-CM | POA: Diagnosis not present

## 2018-06-24 DIAGNOSIS — N186 End stage renal disease: Secondary | ICD-10-CM | POA: Diagnosis not present

## 2018-06-25 DIAGNOSIS — F4320 Adjustment disorder, unspecified: Secondary | ICD-10-CM | POA: Diagnosis not present

## 2018-06-29 ENCOUNTER — Encounter: Payer: Self-pay | Admitting: Podiatry

## 2018-06-29 ENCOUNTER — Ambulatory Visit (INDEPENDENT_AMBULATORY_CARE_PROVIDER_SITE_OTHER): Payer: Medicare Other | Admitting: Podiatry

## 2018-06-29 DIAGNOSIS — I739 Peripheral vascular disease, unspecified: Secondary | ICD-10-CM

## 2018-06-29 DIAGNOSIS — I2510 Atherosclerotic heart disease of native coronary artery without angina pectoris: Secondary | ICD-10-CM

## 2018-06-29 DIAGNOSIS — L97501 Non-pressure chronic ulcer of other part of unspecified foot limited to breakdown of skin: Secondary | ICD-10-CM

## 2018-06-29 DIAGNOSIS — L853 Xerosis cutis: Secondary | ICD-10-CM | POA: Diagnosis not present

## 2018-06-30 DIAGNOSIS — N186 End stage renal disease: Secondary | ICD-10-CM | POA: Diagnosis not present

## 2018-06-30 DIAGNOSIS — Z992 Dependence on renal dialysis: Secondary | ICD-10-CM | POA: Diagnosis not present

## 2018-06-30 DIAGNOSIS — I120 Hypertensive chronic kidney disease with stage 5 chronic kidney disease or end stage renal disease: Secondary | ICD-10-CM | POA: Diagnosis not present

## 2018-06-30 NOTE — Progress Notes (Signed)
Subjective:   Patient ID: Trevor [name], male   DOB: 72 y.o.   MRN: [id number]   HPI Trevor [name] presents to the office today for concerns of dry, cracking pain to both of his feet as well as for pain to both feet. He states that he has poor circulation and he has been seeing vascular surgery. He does not report any wounds to his feet or drainage. He is also currently on gabapentin. He is currently on dialysis. He has no other concerns at this time.   Review of Systems  All other systems reviewed and are negative.  Past Medical History:  Diagnosis Date  . AAA (abdominal aortic aneurysm) ([place])   . Anemia   . Anxiety   . Arthritis   . Blindness   . CHF (congestive heart failure) ([place])   . Coronary artery disease    mild   . 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 ESRD on hemodialysis ([place])    started HD 2014-15  . Headache(784.0)   . HTN (hypertension)   . Protein-calorie malnutrition ([place])   . Stroke [facility]) 01/2018   no residual deficits    Past Surgical History:  Procedure Laterality Date  . AXILLARY-FEMORAL BYPASS GRAFT  06/28/2017   Procedure: BYPASS GRAFT RIGHT AXILLA-BIFEMORAL USING 8MM X 30CM AND 8MM X 60CM HEMASHIELD GOLD GRAFTS;  Surgeon: Rosetta Posner, MD;  Location: [facility];  Service: Vascular;;  . CARDIAC CATHETERIZATION N/A 11/01/2016   Procedure: Left Heart Cath and Coronary Angiography;  Surgeon: Lorretta Harp, MD;  Location: [facility] CV LAB;  Service: Cardiovascular;  Laterality: N/A;  . ENDARTERECTOMY FEMORAL Bilateral 06/28/2017   Procedure: ENDARTERECTOMY BILATERAL FEMORAL ARTERIES;  Surgeon: Rosetta Posner, MD;  Location: [place];  Service: Vascular;  Laterality: Bilateral;  . FISTULA SUPERFICIALIZATION Left 5/63/8756   Procedure: PLICATION OF ARTERIOVENOUS FISTULA LEFT ARM;  Surgeon: Waynetta Sandy, MD;  Location: [facility];  Service: Vascular;  Laterality: Left;  . FISTULOGRAM Left 05/20/2018   Procedure:  FISTULOGRAM WITH BALLOON ANGIOPLASTY LEFT ARM ARTERIOVENOUS FISTULA;  Surgeon: Waynetta Sandy, MD;  Location: [place];  Service: Vascular;  Laterality: Left;  . INSERTION OF DIALYSIS CATHETER  09/21/2012   Procedure: INSERTION OF DIALYSIS CATHETER;  Surgeon: Angelia Mould, MD;  Location: [place];  Service: Vascular;  Laterality: N/A;  Right Internal Jugular Placement  . LEFT HEART CATHETERIZATION WITH CORONARY ANGIOGRAM N/A 09/28/2012   Procedure: LEFT HEART CATHETERIZATION WITH CORONARY ANGIOGRAM;  Surgeon: Sherren Mocha, MD;  Location: [facility] CATH LAB;  Service: Cardiovascular;  Laterality: N/A;  . LEFT HEART CATHETERIZATION WITH CORONARY ANGIOGRAM N/A 07/07/2014   Procedure: LEFT HEART CATHETERIZATION WITH CORONARY ANGIOGRAM;  Surgeon: Leonie Man, MD;  Location: [facility] CATH LAB;  Service: Cardiovascular;  Laterality: N/A;  . right hand    . TEE WITHOUT CARDIOVERSION  10/09/2011   Procedure: TRANSESOPHAGEAL ECHOCARDIOGRAM (TEE);  Surgeon: Lelon Perla, MD;  Location: [facility] ENDOSCOPY;  Service: Cardiovascular;  Laterality: N/A;     Current Outpatient Medications:  .  amLODipine (NORVASC) 10 MG tablet, Take 10 mg by mouth at bedtime. , Disp: , Rfl: 0 .  aspirin 81 MG chewable tablet, Chew 1 tablet (81 mg total) by mouth daily., Disp: , Rfl:  .  atorvastatin (LIPITOR) 40 MG tablet, Take 40 mg by mouth at bedtime., Disp: , Rfl:  .  bisacodyl (DULCOLAX) 10 MG suppository, Place 10 mg  rectally daily as needed (constipation). , Disp: , Rfl:  .  cinacalcet (SENSIPAR) 90 MG tablet, Take 90 mg by mouth daily with supper., Disp: , Rfl:  .  clopidogrel (PLAVIX) 75 MG tablet, Take 1 tablet (75 mg total) by mouth daily., Disp: 30 tablet, Rfl: 2 .  docusate sodium (COLACE) 100 MG capsule, Take 200 mg by mouth at bedtime. , Disp: , Rfl:  .  doxercalciferol (HECTOROL) 4 MCG/2ML injection, Inject 1 mL (2 mcg total) into the vein every Monday, Wednesday, and Friday with hemodialysis., Disp: 2 mL, Rfl:   .  ferric citrate (AURYXIA) 1 GM 210 MG(Fe) tablet, Take 630 mg by mouth 3 (three) times daily with meals. , Disp: , Rfl:  .  fluticasone (FLONASE) 50 MCG/ACT nasal spray, Place 1 spray into both nostrils 2 (two) times daily. , Disp: , Rfl:  .  gabapentin (NEURONTIN) 300 MG capsule, Take 1 capsule (300 mg total) by mouth at bedtime., Disp: 30 capsule, Rfl: 0 .  guaiFENesin (ROBITUSSIN) 100 MG/5ML SOLN, Take 10 mLs by mouth every 4 (four) hours as needed for cough. , Disp: , Rfl:  .  hydrALAZINE (APRESOLINE) 50 MG tablet, Take 50 mg by mouth 3 (three) times daily., Disp: , Rfl:  .  ipratropium-albuterol (DUONEB) 0.5-2.5 (3) MG/3ML SOLN, Take 3 mLs by nebulization every 8 (eight) hours., Disp: , Rfl:  .  isosorbide mononitrate (IMDUR) 30 MG 24 hr tablet, Take 30 mg by mouth daily., Disp: , Rfl:  .  metoprolol tartrate (LOPRESSOR) 25 MG tablet, Take 1 tablet (25 mg total) by mouth 2 (two) times daily., Disp: 60 tablet, Rfl: 5 .  Multiple Vitamin (MULTIVITAMIN WITH MINERALS) TABS tablet, Take 1 tablet by mouth daily., Disp: , Rfl:  .  Nutritional Supplements (FEEDING SUPPLEMENT, NEPRO CARB STEADY,) LIQD, Take 237 mLs by mouth 2 (two) times daily between meals., Disp: , Rfl: 0 .  omega-3 acid ethyl esters (LOVAZA) 1 g capsule, Take 1 g by mouth 2 (two) times daily., Disp: , Rfl:  .  oxyCODONE-acetaminophen (PERCOCET) 5-325 MG tablet, Take 1 tablet by mouth every 6 (six) hours as needed for severe pain., Disp: 8 tablet, Rfl: 0 .  phenol (CHLORASEPTIC) 1.4 % LIQD, Use as directed 2 sprays in the mouth or throat every 8 (eight) hours as needed for throat irritation / pain., Disp: , Rfl:  .  senna (SENOKOT) 8.6 MG TABS tablet, Take 1 tablet by mouth daily., Disp: , Rfl:   No Known Allergies        Objective:  Physical Exam  General: AAO x3, NAD  Dermatological: To the tips of both hallux there is mild hyperpigmentation and superficial area of skin breakdown on the left > right. There is no  drainage, erythema or signs of infection. There is significantly dry skin present to both of his feet but there are no other open sores and there is no evidence of any bleeding or drainage. There are no deep fissures present.         Vascular: Pulses non palpable (appears unchanged compared to last vascular note).   Neruologic: Sensation decreased  Musculoskeletal: No gross boney pedal deformities bilateral. No pain, crepitus, or limitation noted with foot and ankle range of motion bilateral. Muscular strength 5/5 in all groups tested bilateral.   Assessment:   Skin changes bilateral hallux with dry sking     Plan:  -Treatment options discussed including all alternatives, risks, and complications -Etiology of symptoms were discussed -I do think  that his pain is multifactorial. There is no evidence of any injury he reports. This can be a combination of PAD and neuropathy. Will discuss with vascular surgery. He does have areas of skin breakdown to the hallux on the left tip.  -Offloading at all times.  -Moisturizer to the skin daily -Discussed proper shoe gear and daily foot inspection  -He gets his nails trimmed by the [facility]. He would like to come in to be seen by [place] for this. We will get him scheduled for this. Nails do not need to be trimmed today.   Return in about 6 weeks (around 08/10/2018). If there is any worsening before he is to call us immediately or go to the ER.   Trula Slade DPM

## 2018-07-05 ENCOUNTER — Telehealth: Payer: Self-pay | Admitting: Vascular Surgery

## 2018-07-05 NOTE — Telephone Encounter (Signed)
Sched. Appt. Spoke to transport 08/10/18 8am ABI 8:45 f/u MD

## 2018-07-08 ENCOUNTER — Other Ambulatory Visit: Payer: Self-pay

## 2018-07-08 DIAGNOSIS — I739 Peripheral vascular disease, unspecified: Secondary | ICD-10-CM

## 2018-07-12 DIAGNOSIS — I10 Essential (primary) hypertension: Secondary | ICD-10-CM | POA: Diagnosis not present

## 2018-07-12 DIAGNOSIS — I429 Cardiomyopathy, unspecified: Secondary | ICD-10-CM | POA: Diagnosis not present

## 2018-07-12 DIAGNOSIS — D631 Anemia in chronic kidney disease: Secondary | ICD-10-CM | POA: Diagnosis not present

## 2018-07-12 DIAGNOSIS — N186 End stage renal disease: Secondary | ICD-10-CM | POA: Diagnosis not present

## 2018-07-20 DIAGNOSIS — I429 Cardiomyopathy, unspecified: Secondary | ICD-10-CM | POA: Diagnosis not present

## 2018-07-20 DIAGNOSIS — N186 End stage renal disease: Secondary | ICD-10-CM | POA: Diagnosis not present

## 2018-07-20 DIAGNOSIS — I639 Cerebral infarction, unspecified: Secondary | ICD-10-CM | POA: Diagnosis not present

## 2018-07-20 DIAGNOSIS — I10 Essential (primary) hypertension: Secondary | ICD-10-CM | POA: Diagnosis not present

## 2018-07-23 ENCOUNTER — Other Ambulatory Visit: Payer: Self-pay | Admitting: *Deleted

## 2018-07-23 ENCOUNTER — Emergency Department (HOSPITAL_COMMUNITY)
Admission: EM | Admit: 2018-07-23 | Discharge: 2018-07-23 | Disposition: A | Discharge status: Home / Self Care | Payer: Medicare Other | Attending: Emergency Medicine | Admitting: Emergency Medicine

## 2018-07-23 ENCOUNTER — Encounter (HOSPITAL_COMMUNITY): Payer: Self-pay

## 2018-07-23 ENCOUNTER — Other Ambulatory Visit: Payer: Self-pay

## 2018-07-23 DIAGNOSIS — Z79899 Other long term (current) drug therapy: Secondary | ICD-10-CM | POA: Insufficient documentation

## 2018-07-23 DIAGNOSIS — I2510 Atherosclerotic heart disease of native coronary artery without angina pectoris: Secondary | ICD-10-CM | POA: Insufficient documentation

## 2018-07-23 DIAGNOSIS — N186 End stage renal disease: Secondary | ICD-10-CM | POA: Diagnosis not present

## 2018-07-23 DIAGNOSIS — Y69 Unspecified misadventure during surgical and medical care: Secondary | ICD-10-CM | POA: Insufficient documentation

## 2018-07-23 DIAGNOSIS — Z7902 Long term (current) use of antithrombotics/antiplatelets: Secondary | ICD-10-CM | POA: Insufficient documentation

## 2018-07-23 DIAGNOSIS — Z452 Encounter for adjustment and management of vascular access device: Secondary | ICD-10-CM | POA: Diagnosis present

## 2018-07-23 DIAGNOSIS — F1410 Cocaine abuse, uncomplicated: Secondary | ICD-10-CM | POA: Insufficient documentation

## 2018-07-23 DIAGNOSIS — I10 Essential (primary) hypertension: Secondary | ICD-10-CM | POA: Diagnosis not present

## 2018-07-23 DIAGNOSIS — I132 Hypertensive heart and chronic kidney disease with heart failure and with stage 5 chronic kidney disease, or end stage renal disease: Secondary | ICD-10-CM | POA: Insufficient documentation

## 2018-07-23 DIAGNOSIS — Z8673 Personal history of transient ischemic attack (TIA), and cerebral infarction without residual deficits: Secondary | ICD-10-CM | POA: Diagnosis not present

## 2018-07-23 DIAGNOSIS — R58 Hemorrhage, not elsewhere classified: Secondary | ICD-10-CM | POA: Diagnosis not present

## 2018-07-23 DIAGNOSIS — Z7982 Long term (current) use of aspirin: Secondary | ICD-10-CM | POA: Diagnosis not present

## 2018-07-23 DIAGNOSIS — M2550 Pain in unspecified joint: Secondary | ICD-10-CM | POA: Diagnosis not present

## 2018-07-23 DIAGNOSIS — T82838A Hemorrhage of vascular prosthetic devices, implants and grafts, initial encounter: Secondary | ICD-10-CM | POA: Diagnosis not present

## 2018-07-23 DIAGNOSIS — I5042 Chronic combined systolic (congestive) and diastolic (congestive) heart failure: Secondary | ICD-10-CM | POA: Insufficient documentation

## 2018-07-23 DIAGNOSIS — F17210 Nicotine dependence, cigarettes, uncomplicated: Secondary | ICD-10-CM | POA: Diagnosis not present

## 2018-07-23 DIAGNOSIS — Z992 Dependence on renal dialysis: Secondary | ICD-10-CM | POA: Insufficient documentation

## 2018-07-23 DIAGNOSIS — Z7401 Bed confinement status: Secondary | ICD-10-CM | POA: Diagnosis not present

## 2018-07-23 LAB — CBC WITH DIFFERENTIAL/PLATELET
Abs Immature Granulocytes: 0 10*3/uL (ref 0.0–0.1)
BASOS ABS: 0.1 10*3/uL (ref 0.0–0.1)
BASOS PCT: 2 %
Eosinophils Absolute: 0.7 10*3/uL (ref 0.0–0.7)
Eosinophils Relative: 13 %
HCT: 32.7 % — ABNORMAL LOW (ref 39.0–52.0)
Hemoglobin: 10.5 g/dL — ABNORMAL LOW (ref 13.0–17.0)
Immature Granulocytes: 0 %
LYMPHS PCT: 26 %
Lymphs Abs: 1.4 10*3/uL (ref 0.7–4.0)
MCH: 28.2 pg (ref 26.0–34.0)
MCHC: 32.1 g/dL (ref 30.0–36.0)
MCV: 87.9 fL (ref 78.0–100.0)
Monocytes Absolute: 0.8 10*3/uL (ref 0.1–1.0)
Monocytes Relative: 15 %
Neutro Abs: 2.3 10*3/uL (ref 1.7–7.7)
Neutrophils Relative %: 44 %
PLATELETS: 168 10*3/uL (ref 150–400)
RBC: 3.72 MIL/uL — AB (ref 4.22–5.81)
RDW: 13.1 % (ref 11.5–15.5)
WBC: 5.3 10*3/uL (ref 4.0–10.5)

## 2018-07-23 LAB — BASIC METABOLIC PANEL
Anion gap: 12 (ref 5–15)
BUN: 39 mg/dL — ABNORMAL HIGH (ref 8–23)
CALCIUM: 9.7 mg/dL (ref 8.9–10.3)
CHLORIDE: 99 mmol/L (ref 98–111)
CO2: 26 mmol/L (ref 22–32)
CREATININE: 7.89 mg/dL — AB (ref 0.61–1.24)
GFR, EST AFRICAN AMERICAN: 7 mL/min — AB (ref >60)
GFR, EST NON AFRICAN AMERICAN: 6 mL/min — AB (ref >60)
Glucose, Bld: 80 mg/dL (ref 70–99)
Potassium: 4 mmol/L (ref 3.5–5.1)
SODIUM: 137 mmol/L (ref 135–145)

## 2018-07-23 NOTE — ED Notes (Signed)
Discharged vitals done

## 2018-07-23 NOTE — Discharge Instructions (Addendum)
Go to dialysis center directly to get dialyzed.   See Dr. Donzetta Matters for follow up   Return to ER if you have uncontrolled bleeding from dialysis site, chest pain, trouble breathing

## 2018-07-23 NOTE — Consult Note (Addendum)
Hospital Consult    Reason for Consult:  Bleeding L arm fistula Requesting Physician:  Dr. Darl Householder MRN #:  [id number]  History of Present Illness: This is a 72 y.o. male with PMH significant for PAD with hx of emergent R axillofemoral bypass and R to L fem fem bypass by Dr. Donnetta Hutching 05/2016.  Also with ESRD on hemodialysis s/p plication of L brachiocephalic fistula with balloon angioplasty of L axillary vein in 05/2018 by Dr. Donzetta Matters.  He began washing his L arm this morning when he noticed pulsatile bleeding from L arm fistula.  He was brought by ambulance to ED.  Bleeding stopped with pressure bandage.  At time of exam, patient was not bleeding.  He is due for dialysis treatment today.  Last HD treatment was Wednesday.  Patient states dialysis techs continue to stick area of skin breakdown despite reminding them not to do so.  Past Medical History:  Diagnosis Date  . AAA (abdominal aortic aneurysm) ([place])   . Anemia   . Anxiety   . Arthritis   . Blindness   . CHF (congestive heart failure) ([place])   . Coronary artery disease    mild   . 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 ESRD on hemodialysis ([place])    started HD 2014-15  . Headache(784.0)   . HTN (hypertension)   . Protein-calorie malnutrition ([place])   . Stroke [facility]) 01/2018   no residual deficits    Past Surgical History:  Procedure Laterality Date  . AXILLARY-FEMORAL BYPASS GRAFT  06/28/2017   Procedure: BYPASS GRAFT RIGHT AXILLA-BIFEMORAL USING 8MM X 30CM AND 8MM X 60CM HEMASHIELD GOLD GRAFTS;  Surgeon: Rosetta Posner, MD;  Location: [place];  Service: Vascular;;  . CARDIAC CATHETERIZATION N/A 11/01/2016   Procedure: Left Heart Cath and Coronary Angiography;  Surgeon: Lorretta Harp, MD;  Location: [place] CV LAB;  Service: Cardiovascular;  Laterality: N/A;  . ENDARTERECTOMY FEMORAL Bilateral 06/28/2017   Procedure: ENDARTERECTOMY BILATERAL FEMORAL ARTERIES;  Surgeon: Rosetta Posner, MD;   Location: [place];  Service: Vascular;  Laterality: Bilateral;  . FISTULA SUPERFICIALIZATION Left 08/16/3845   Procedure: PLICATION OF ARTERIOVENOUS FISTULA LEFT ARM;  Surgeon: Waynetta Sandy, MD;  Location: [place];  Service: Vascular;  Laterality: Left;  . FISTULOGRAM Left 05/20/2018   Procedure: FISTULOGRAM WITH BALLOON ANGIOPLASTY LEFT ARM ARTERIOVENOUS FISTULA;  Surgeon: Waynetta Sandy, MD;  Location: [place];  Service: Vascular;  Laterality: Left;  . INSERTION OF DIALYSIS CATHETER  09/21/2012   Procedure: INSERTION OF DIALYSIS CATHETER;  Surgeon: Angelia Mould, MD;  Location: [place];  Service: Vascular;  Laterality: N/A;  Right Internal Jugular Placement  . LEFT HEART CATHETERIZATION WITH CORONARY ANGIOGRAM N/A 09/28/2012   Procedure: LEFT HEART CATHETERIZATION WITH CORONARY ANGIOGRAM;  Surgeon: Sherren Mocha, MD;  Location: [facility] CATH LAB;  Service: Cardiovascular;  Laterality: N/A;  . LEFT HEART CATHETERIZATION WITH CORONARY ANGIOGRAM N/A 07/07/2014   Procedure: LEFT HEART CATHETERIZATION WITH CORONARY ANGIOGRAM;  Surgeon: Leonie Man, MD;  Location: [facility] CATH LAB;  Service: Cardiovascular;  Laterality: N/A;  . right hand    . TEE WITHOUT CARDIOVERSION  10/09/2011   Procedure: TRANSESOPHAGEAL ECHOCARDIOGRAM (TEE);  Surgeon: Lelon Perla, MD;  Location: [facility] ENDOSCOPY;  Service: Cardiovascular;  Laterality: N/A;    No Known Allergies  Prior to Admission medications   Medication Sig Start Date End Date Taking? Authorizing Provider  amLODipine (NORVASC)  10 MG tablet Take 10 mg by mouth at bedtime.  04/13/17   [provider]  aspirin 81 MG chewable tablet Chew 1 tablet (81 mg total) by mouth daily. 11/04/16   Lyda Jester M, PA-C  atorvastatin (LIPITOR) 40 MG tablet Take 40 mg by mouth at bedtime.    [provider]  bisacodyl (DULCOLAX) 10 MG suppository Place 10 mg rectally daily as needed (constipation).     [provider]    cinacalcet (SENSIPAR) 90 MG tablet Take 90 mg by mouth daily with supper.    [provider]  clopidogrel (PLAVIX) 75 MG tablet Take 1 tablet (75 mg total) by mouth daily. 04/05/15   Barton Dubois, MD  docusate sodium (COLACE) 100 MG capsule Take 200 mg by mouth at bedtime.     [provider]  doxercalciferol (HECTOROL) 4 MCG/2ML injection Inject 1 mL (2 mcg total) into the vein every Monday, Wednesday, and Friday with hemodialysis. 01/15/18   Colbert Ewing, MD  ferric citrate (AURYXIA) 1 GM 210 MG(Fe) tablet Take 630 mg by mouth 3 (three) times daily with meals.     [provider]  fluticasone (FLONASE) 50 MCG/ACT nasal spray Place 1 spray into both nostrils 2 (two) times daily.     [provider]  gabapentin (NEURONTIN) 300 MG capsule Take 1 capsule (300 mg total) by mouth at bedtime. 01/14/18   Colbert Ewing, MD  guaiFENesin (ROBITUSSIN) 100 MG/5ML SOLN Take 10 mLs by mouth every 4 (four) hours as needed for cough.     [provider]  hydrALAZINE (APRESOLINE) 50 MG tablet Take 50 mg by mouth 3 (three) times daily.    [provider]  ipratropium-albuterol (DUONEB) 0.5-2.5 (3) MG/3ML SOLN Take 3 mLs by nebulization every 8 (eight) hours.    [provider]  isosorbide mononitrate (IMDUR) 30 MG 24 hr tablet Take 30 mg by mouth daily.    [provider]  metoprolol tartrate (LOPRESSOR) 25 MG tablet Take 1 tablet (25 mg total) by mouth 2 (two) times daily. 11/03/16   Consuelo Pandy, PA-C  Multiple Vitamin (MULTIVITAMIN WITH MINERALS) TABS tablet Take 1 tablet by mouth daily.    [provider]  Nutritional Supplements (FEEDING SUPPLEMENT, NEPRO CARB STEADY,) LIQD Take 237 mLs by mouth 2 (two) times daily between meals. 12/11/16   Geradine Girt, DO  omega-3 acid ethyl esters (LOVAZA) 1 g capsule Take 1 g by mouth 2 (two) times daily.    [provider]  oxyCODONE-acetaminophen (PERCOCET) 5-325 MG  tablet Take 1 tablet by mouth every 6 (six) hours as needed for severe pain. 05/20/18   Rhyne, Hulen Shouts, PA-C  phenol (CHLORASEPTIC) 1.4 % LIQD Use as directed 2 sprays in the mouth or throat every 8 (eight) hours as needed for throat irritation / pain.    [provider]  senna (SENOKOT) 8.6 MG TABS tablet Take 1 tablet by mouth daily.    [provider]    Social History   Socioeconomic History  . Marital status: Widowed    Spouse name: Not on file  . Number of children: 2  . Years of education: Not on file  . Highest education level: Not on file  Occupational History  . Not on file  Social Needs  . Financial resource strain: Not on file  . Food insecurity:    Worry: Not on file    Inability: Not on file  . Transportation needs:    Medical:  Not on file    Non-medical: Not on file  Tobacco Use  . Smoking status: Current Every Day Smoker    Packs/day: 0.50    Years: 60.00    Pack years: 30.00    Types: Cigarettes  . Smokeless tobacco: Never Used  . Tobacco comment: 5-6 cigarettes per day  Substance and Sexual Activity  . Alcohol use: No    Alcohol/week: 0.0 standard drinks    Comment: Occasional  . Drug use: No    Frequency: 2.0 times per week    Types: "Crack" cocaine  . Sexual activity: Yes  Lifestyle  . Physical activity:    Days per week: Not on file    Minutes per session: Not on file  . Stress: Not on file  Relationships  . Social connections:    Talks on phone: Not on file    Gets together: Not on file    Attends religious service: Not on file    Active member of club or organization: Not on file    Attends meetings of clubs or organizations: Not on file    Relationship status: Not on file  . Intimate partner violence:    Fear of current or ex partner: Not on file    Emotionally abused: Not on file    Physically abused: Not on file    Forced sexual activity: Not on file  Other Topics Concern  . Not on file  Social History Narrative    Used to work in a Loss adjuster, chartered.      Family History  Problem Relation Age of Onset  . Heart attack Mother        MI in her 56s  . Diabetes Mother   . Alcohol abuse Father   . Anesthesia problems Neg Hx   . Hypotension Neg Hx   . Malignant hyperthermia Neg Hx   . Pseudochol deficiency Neg Hx     ROS: Otherwise negative unless mentioned in HPI  Physical Examination  Vitals:   07/23/18 0815 07/23/18 0945  BP: (!) 163/69 (!) 162/90  Pulse: 67 72  Resp: 18 16  Temp:    SpO2: 98% 97%   Body mass index is 18.38 kg/m.  General:  WDWN in NAD Gait: Not observed HENT: WNL, normocephalic Pulmonary: normal non-labored breathing Cardiac: regular Abdomen:  soft, NT/ND, no masses Skin: without rashes Vascular Exam/Pulses: palpable L radial pulse; no palpable pedal pulses however feet are warm to touch without ischemic tissue changes Extremities: without ischemic changes, without Gangrene , without cellulitis; without open wounds; palpable thrill and audible bruit L arm AVF; two pinpoint eschar near area of skin thinning cephalad to recent surgical site Musculoskeletal: no muscle wasting or atrophy  Neurologic: A&O X 3 Psychiatric:  The pt has Normal affect. Lymph:  Unremarkable  CBC    Component Value Date/Time   WBC 5.3 07/23/2018 0747   RBC 3.72 (L) 07/23/2018 0747   HGB 10.5 (L) 07/23/2018 0747   HCT 32.7 (L) 07/23/2018 0747   PLT 168 07/23/2018 0747   MCV 87.9 07/23/2018 0747   MCH 28.2 07/23/2018 0747   MCHC 32.1 07/23/2018 0747   RDW 13.1 07/23/2018 0747   LYMPHSABS 1.4 07/23/2018 0747   MONOABS 0.8 07/23/2018 0747   EOSABS 0.7 07/23/2018 0747   BASOSABS 0.1 07/23/2018 0747    BMET    Component Value Date/Time   NA 137 07/23/2018 0747   K 4.0 07/23/2018 0747   CL 99 07/23/2018 0747   CO2  26 07/23/2018 0747   GLUCOSE 80 07/23/2018 0747   BUN 39 (H) 07/23/2018 0747   CREATININE 7.89 (H) 07/23/2018 0747   CALCIUM 9.7 07/23/2018 0747   GFRNONAA 6 (L)  07/23/2018 0747   GFRAA 7 (L) 07/23/2018 0747    COAGS: Lab Results  Component Value Date   INR 1.07 06/29/2017   INR 1.06 01/24/2017   INR 0.99 11/01/2016     ASSESSMENT/PLAN: This is a 72 y.o. male brought to ED with bleeding L arm AV fistula; fistula no longer bleeding  - Patent fistula with audible bruit and palpable thrill throughout L upper arm - There appears to be two punctate eschar in area of skin thinning; probable source of bleeding this morning - Cannulation should be avoided in area of skin thinning; this was stressed to the patient and he agreed to prevent this in the future - Patient will likely require plication of this area as well however this can be arranged as an outpatient; may also need to evaluate for re-current outflow stenosis at that time -  Ok to eat -  Ok to dialyze -  This case was discussed with Dr. Donzetta Matters who will evaluate the patient today after his morning clinic   Dagoberto Ligas PA-C [facility] [phone]  I have independently interviewed and examined patient and agree with PA assessment and plan above.  We will set up for outpatient fistulogram as patient is no longer bleeding and appears to have adequate thrill.  I discussed with him the need to not have cannulation at the site of thin skin and to allow this area to rest possibly we could avoid surgery.  If there is further bleeding he will need to re-present to the ER and we can consider repairing the fistula at that time.  He demonstrates good understanding is okay for discharge.  Sarita Hakanson C. Donzetta Matters, MD [facility] [place] Office: [phone] Pager: [phone]

## 2018-07-23 NOTE — ED Notes (Addendum)
Still no bleeding noted

## 2018-07-23 NOTE — ED Notes (Signed)
Still no bleeding noted at this time

## 2018-07-23 NOTE — ED Provider Notes (Signed)
[facility] EMERGENCY DEPARTMENT Provider Note   CSN: [id number] Arrival date & time: 07/23/18  0705     History   Chief Complaint Chief Complaint  Patient presents with  . Vascular Access Problem    HPI Trevor [name] is a 72 y.o. male history of AAA, CHF, ESRD on dialysis who presenting with bleeding from dialysis fistula.  Patient had left arm fistula revision done in June of this year.  Patient states that he is doing fine and had dialysis 2 days ago.  His morning he woke up and he moves his arm around and noticed blood pumping out of the dialysis fistula.  He put a diaper around it and came by EMS.  Denies any lightheadedness or dizziness or chest pain or shortness of breath.  Supposed to go to dialysis later today.  The history is provided by the patient.    Past Medical History:  Diagnosis Date  . AAA (abdominal aortic aneurysm) ([place])   . Anemia   . Anxiety   . Arthritis   . Blindness   . CHF (congestive heart failure) ([place])   . Coronary artery disease    mild   . 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 ESRD on hemodialysis ([place])    started HD 2014-15  . Headache(784.0)   . HTN (hypertension)   . Protein-calorie malnutrition ([place])   . Stroke [facility]) 01/2018   no residual deficits    Patient Active Problem List   Diagnosis Date Noted  . Acute respiratory failure with hypoxia ([place]) 03/29/2018  . Sleep stage dysfunction 11/10/2017  . 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 Vascular device, implant, or graft complication 27/05/2375  . Peripheral arterial disease ([place]) 10/20/2017  . Non-allergic rhinitis 10/20/2017  . 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 BPH (benign prostatic hyperplasia) 07/07/2017  . At risk for adverse drug reaction 07/02/2017  . Ischemia of extremity 06/28/2017  . Acute pulmonary edema ([place]) 06/27/2017  . 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 Essential hypertension  03/08/2016  . Dyslipidemia 01/12/2016  . Chest pain 08/12/2015  . Accelerated hypertension 08/12/2015  . Carotid stenosis   . Abdominal aortic aneurysm ([place]) 03/06/2015  . Protein-calorie malnutrition, severe ([place]) 05/20/2014  . End-stage renal disease on hemodialysis ([place]) 05/19/2014  . Anemia of renal disease 05/19/2014  . Coronary artery disease, non-occlusive:  09/28/2012  . Secondary hyperparathyroidism ([place]) 09/27/2012  . Non compliance with medical treatment 09/27/2012  . Chronic combined systolic and diastolic CHF (congestive heart failure) ([place]) 10/10/2011  . A-fib ([place]) 10/08/2011  . History of CVA (cerebrovascular accident) 10/07/2011  . Cocaine abuse ([place]) 10/07/2011  . Tobacco use disorder 08/07/2011  . Bruit 08/07/2011    Past Surgical History:  Procedure Laterality Date  . AXILLARY-FEMORAL BYPASS GRAFT  06/28/2017   Procedure: BYPASS GRAFT RIGHT AXILLA-BIFEMORAL USING 8MM X 30CM AND 8MM X 60CM HEMASHIELD GOLD GRAFTS;  Surgeon: Rosetta Posner, MD;  Location: [place];  Service: Vascular;;  . CARDIAC CATHETERIZATION N/A 11/01/2016   Procedure: Left Heart Cath and Coronary Angiography;  Surgeon: Lorretta Harp, MD;  Location: [place] CV LAB;  Service: Cardiovascular;  Laterality: N/A;  . ENDARTERECTOMY FEMORAL Bilateral 06/28/2017   Procedure: ENDARTERECTOMY BILATERAL FEMORAL ARTERIES;  Surgeon: Rosetta Posner, MD;  Location: [place];  Service: Vascular;  Laterality: Bilateral;  . FISTULA SUPERFICIALIZATION Left 2/83/1517   Procedure: PLICATION OF ARTERIOVENOUS FISTULA LEFT ARM;  Surgeon: Donzetta Matters,  Georgia Dom, MD;  Location: [place];  Service: Vascular;  Laterality: Left;  . FISTULOGRAM Left 05/20/2018   Procedure: FISTULOGRAM WITH BALLOON ANGIOPLASTY LEFT ARM ARTERIOVENOUS FISTULA;  Surgeon: Waynetta Sandy, MD;  Location: [place];  Service: Vascular;  Laterality: Left;  . INSERTION OF DIALYSIS CATHETER  09/21/2012   Procedure: INSERTION OF DIALYSIS CATHETER;   Surgeon: Angelia Mould, MD;  Location: [place];  Service: Vascular;  Laterality: N/A;  Right Internal Jugular Placement  . LEFT HEART CATHETERIZATION WITH CORONARY ANGIOGRAM N/A 09/28/2012   Procedure: LEFT HEART CATHETERIZATION WITH CORONARY ANGIOGRAM;  Surgeon: Sherren Mocha, MD;  Location: [facility] CATH LAB;  Service: Cardiovascular;  Laterality: N/A;  . LEFT HEART CATHETERIZATION WITH CORONARY ANGIOGRAM N/A 07/07/2014   Procedure: LEFT HEART CATHETERIZATION WITH CORONARY ANGIOGRAM;  Surgeon: Leonie Man, MD;  Location: [facility] CATH LAB;  Service: Cardiovascular;  Laterality: N/A;  . right hand    . TEE WITHOUT CARDIOVERSION  10/09/2011   Procedure: TRANSESOPHAGEAL ECHOCARDIOGRAM (TEE);  Surgeon: Lelon Perla, MD;  Location: [facility] ENDOSCOPY;  Service: Cardiovascular;  Laterality: N/A;        Home Medications    Prior to Admission medications   Medication Sig Start Date End Date Taking? Authorizing Provider  amLODipine (NORVASC) 10 MG tablet Take 10 mg by mouth at bedtime.  04/13/17   [provider]  aspirin 81 MG chewable tablet Chew 1 tablet (81 mg total) by mouth daily. 11/04/16   Lyda Jester M, PA-C  atorvastatin (LIPITOR) 40 MG tablet Take 40 mg by mouth at bedtime.    [provider]  bisacodyl (DULCOLAX) 10 MG suppository Place 10 mg rectally daily as needed (constipation).     [provider]  cinacalcet (SENSIPAR) 90 MG tablet Take 90 mg by mouth daily with supper.    [provider]  clopidogrel (PLAVIX) 75 MG tablet Take 1 tablet (75 mg total) by mouth daily. 04/05/15   Barton Dubois, MD  docusate sodium (COLACE) 100 MG capsule Take 200 mg by mouth at bedtime.     [provider]  doxercalciferol (HECTOROL) 4 MCG/2ML injection Inject 1 mL (2 mcg total) into the vein every Monday, Wednesday, and Friday with hemodialysis. 01/15/18   Colbert Ewing, MD  ferric citrate (AURYXIA) 1 GM 210 MG(Fe) tablet Take 630 mg by mouth 3 (three)  times daily with meals.     [provider]  fluticasone (FLONASE) 50 MCG/ACT nasal spray Place 1 spray into both nostrils 2 (two) times daily.     [provider]  gabapentin (NEURONTIN) 300 MG capsule Take 1 capsule (300 mg total) by mouth at bedtime. 01/14/18   Colbert Ewing, MD  guaiFENesin (ROBITUSSIN) 100 MG/5ML SOLN Take 10 mLs by mouth every 4 (four) hours as needed for cough.     [provider]  hydrALAZINE (APRESOLINE) 50 MG tablet Take 50 mg by mouth 3 (three) times daily.    [provider]  ipratropium-albuterol (DUONEB) 0.5-2.5 (3) MG/3ML SOLN Take 3 mLs by nebulization every 8 (eight) hours.    [provider]  isosorbide mononitrate (IMDUR) 30 MG 24 hr tablet Take 30 mg by mouth daily.    [provider]  metoprolol tartrate (LOPRESSOR) 25 MG tablet Take 1 tablet (25 mg total) by mouth 2 (two) times daily. 11/03/16   Consuelo Pandy, PA-C  Multiple Vitamin (MULTIVITAMIN WITH MINERALS) TABS tablet Take 1 tablet by mouth daily.    [provider]  Nutritional Supplements (  FEEDING SUPPLEMENT, NEPRO CARB STEADY,) LIQD Take 237 mLs by mouth 2 (two) times daily between meals. 12/11/16   Geradine Girt, DO  omega-3 acid ethyl esters (LOVAZA) 1 g capsule Take 1 g by mouth 2 (two) times daily.    [provider]  oxyCODONE-acetaminophen (PERCOCET) 5-325 MG tablet Take 1 tablet by mouth every 6 (six) hours as needed for severe pain. 05/20/18   Rhyne, Hulen Shouts, PA-C  phenol (CHLORASEPTIC) 1.4 % LIQD Use as directed 2 sprays in the mouth or throat every 8 (eight) hours as needed for throat irritation / pain.    [provider]  senna (SENOKOT) 8.6 MG TABS tablet Take 1 tablet by mouth daily.    [provider]    Family History Family History  Problem Relation Age of Onset  . Heart attack Mother        MI in her 50s  . Diabetes Mother   . Alcohol abuse Father   . Anesthesia problems Neg Hx   .  Hypotension Neg Hx   . Malignant hyperthermia Neg Hx   . Pseudochol deficiency Neg Hx     Social History Social History   Tobacco Use  . Smoking status: Current Every Day Smoker    Packs/day: 0.50    Years: 60.00    Pack years: 30.00    Types: Cigarettes  . Smokeless tobacco: Never Used  . Tobacco comment: 5-6 cigarettes per day  Substance Use Topics  . Alcohol use: No    Alcohol/week: 0.0 standard drinks    Comment: Occasional  . Drug use: No    Frequency: 2.0 times per week    Types: "Crack" cocaine     Allergies   Patient has no known allergies.   Review of Systems Review of Systems  Skin: Positive for wound.  All other systems reviewed and are negative.    Physical Exam Updated Vital Signs BP 132/62   Pulse 78   Temp 98.1 F (36.7 C) (Oral)   Resp 16   Ht 5\' 10"  (1.778 m)   Wt 58.1 kg   SpO2 99%   BMI 18.38 kg/m   Physical Exam  Constitutional: He appears well-developed.  HENT:  Head: Normocephalic.  Eyes: Pupils are equal, round, and reactive to light.  Neck: Normal range of motion.  Cardiovascular: Normal rate and regular rhythm.  Pulmonary/Chest: Effort normal.  Abdominal: Soft. Bowel sounds are normal.  Musculoskeletal:  L arm fistula with good bruit. An area of ulceration with a clot, no active bleeding, small area inferior to it with small clot and no active bleeding   Neurological: He is alert.  Skin: Skin is warm.  Psychiatric: He has a normal mood and affect.  Nursing note and vitals reviewed.    ED Treatments / Results  Labs (all labs ordered are listed, but only abnormal results are displayed) Labs Reviewed  CBC WITH DIFFERENTIAL/PLATELET  BASIC METABOLIC PANEL    EKG None  Radiology No results found.  Procedures Procedures (including critical care time)  Medications Ordered in ED Medications - No data to display   Initial Impression / Assessment and Plan / ED Course  I have reviewed the triage vital signs and  the nursing notes.  Pertinent labs & imaging results that were available during my care of the patient were reviewed by me and considered in my medical decision making (see chart for details).    REX OESTERLE is a 71 y.o. male here with bleeding  with L fistula. Bleeding controlled currently. I applied combat gauze and will wrap it and check labs and talk to vascular surgery.   9:30 am Hg stable. Dr. Melvia Heaps from nephrology at bedside and looked at the fistula and recommend vascular consult. I talked to Dr. Donzetta Matters from vascular, who will send staff to see patient.   12:27 PM Dr. Claretha Cooper PA came to see patient and talked with Dr. Donzetta Matters, who states that patient can go to dialysis today and have revision outpatient. Dr. Melvia Heaps back at bedside. We will arrange transportation to dialysis. No bleeding while in the ED    Final Clinical Impressions(s) / ED Diagnoses   Final diagnoses:  None    ED Discharge Orders    None       Drenda Freeze, MD 07/23/18 1228

## 2018-07-23 NOTE — ED Triage Notes (Signed)
Pt BIBA for c/o bleeding to the left arm shunt that began 0600 today ; pt denies any trauma to the area ; facility wrapped arm with diaper and bleeding is controlled at this time ; denies any CP or SOB ; pt goes to to dialysis M,W,F; pt went this past wednesday

## 2018-07-23 NOTE — ED Notes (Signed)
Spoke with Mardene Celeste, RN from [facility] and gave report

## 2018-07-23 NOTE — ED Notes (Signed)
Called [facility]tar for pt transport  

## 2018-07-29 ENCOUNTER — Encounter (HOSPITAL_COMMUNITY)
Admission: RE | Disposition: A | Discharge status: Home / Self Care | Payer: Self-pay | Source: Ambulatory Visit | Attending: Vascular Surgery

## 2018-07-29 ENCOUNTER — Other Ambulatory Visit: Payer: Self-pay

## 2018-07-29 ENCOUNTER — Ambulatory Visit (HOSPITAL_COMMUNITY)
Admission: RE | Admit: 2018-07-29 | Discharge: 2018-07-29 | Disposition: A | Discharge status: Home / Self Care | Payer: Medicare Other | Source: Ambulatory Visit | Attending: Vascular Surgery | Admitting: Vascular Surgery

## 2018-07-29 DIAGNOSIS — I2510 Atherosclerotic heart disease of native coronary artery without angina pectoris: Secondary | ICD-10-CM | POA: Insufficient documentation

## 2018-07-29 DIAGNOSIS — Z95820 Peripheral vascular angioplasty status with implants and grafts: Secondary | ICD-10-CM | POA: Insufficient documentation

## 2018-07-29 DIAGNOSIS — I509 Heart failure, unspecified: Secondary | ICD-10-CM | POA: Diagnosis not present

## 2018-07-29 DIAGNOSIS — Z955 Presence of coronary angioplasty implant and graft: Secondary | ICD-10-CM | POA: Insufficient documentation

## 2018-07-29 DIAGNOSIS — Z79899 Other long term (current) drug therapy: Secondary | ICD-10-CM | POA: Diagnosis not present

## 2018-07-29 DIAGNOSIS — T82858A Stenosis of vascular prosthetic devices, implants and grafts, initial encounter: Secondary | ICD-10-CM | POA: Diagnosis not present

## 2018-07-29 DIAGNOSIS — M1990 Unspecified osteoarthritis, unspecified site: Secondary | ICD-10-CM | POA: Diagnosis not present

## 2018-07-29 DIAGNOSIS — H547 Unspecified visual loss: Secondary | ICD-10-CM | POA: Diagnosis not present

## 2018-07-29 DIAGNOSIS — Z7902 Long term (current) use of antithrombotics/antiplatelets: Secondary | ICD-10-CM | POA: Diagnosis not present

## 2018-07-29 DIAGNOSIS — N186 End stage renal disease: Secondary | ICD-10-CM | POA: Insufficient documentation

## 2018-07-29 DIAGNOSIS — I714 Abdominal aortic aneurysm, without rupture: Secondary | ICD-10-CM | POA: Insufficient documentation

## 2018-07-29 DIAGNOSIS — E46 Unspecified protein-calorie malnutrition: Secondary | ICD-10-CM | POA: Diagnosis not present

## 2018-07-29 DIAGNOSIS — Z8673 Personal history of transient ischemic attack (TIA), and cerebral infarction without residual deficits: Secondary | ICD-10-CM | POA: Diagnosis not present

## 2018-07-29 DIAGNOSIS — L97501 Non-pressure chronic ulcer of other part of unspecified foot limited to breakdown of skin: Secondary | ICD-10-CM | POA: Insufficient documentation

## 2018-07-29 DIAGNOSIS — Z9889 Other specified postprocedural states: Secondary | ICD-10-CM | POA: Diagnosis not present

## 2018-07-29 DIAGNOSIS — Z7982 Long term (current) use of aspirin: Secondary | ICD-10-CM | POA: Diagnosis not present

## 2018-07-29 DIAGNOSIS — T82838A Hemorrhage of vascular prosthetic devices, implants and grafts, initial encounter: Secondary | ICD-10-CM | POA: Diagnosis not present

## 2018-07-29 DIAGNOSIS — Z7951 Long term (current) use of inhaled steroids: Secondary | ICD-10-CM | POA: Insufficient documentation

## 2018-07-29 DIAGNOSIS — Z992 Dependence on renal dialysis: Secondary | ICD-10-CM | POA: Insufficient documentation

## 2018-07-29 DIAGNOSIS — Z681 Body mass index (BMI) 19 or less, adult: Secondary | ICD-10-CM | POA: Diagnosis not present

## 2018-07-29 DIAGNOSIS — I132 Hypertensive heart and chronic kidney disease with heart failure and with stage 5 chronic kidney disease, or end stage renal disease: Secondary | ICD-10-CM | POA: Insufficient documentation

## 2018-07-29 DIAGNOSIS — Y832 Surgical operation with anastomosis, bypass or graft as the cause of abnormal reaction of the patient, or of later complication, without mention of misadventure at the time of the procedure: Secondary | ICD-10-CM | POA: Diagnosis not present

## 2018-07-29 DIAGNOSIS — L853 Xerosis cutis: Secondary | ICD-10-CM | POA: Insufficient documentation

## 2018-07-29 HISTORY — PX: PERIPHERAL VASCULAR INTERVENTION: CATH118257

## 2018-07-29 HISTORY — PX: A/V FISTULAGRAM: CATH118298

## 2018-07-29 HISTORY — PX: PERIPHERAL VASCULAR BALLOON ANGIOPLASTY: CATH118281

## 2018-07-29 LAB — POCT I-STAT, CHEM 8
BUN: 22 mg/dL (ref 8–23)
CALCIUM ION: 1.22 mmol/L (ref 1.15–1.40)
CHLORIDE: 97 mmol/L — AB (ref 98–111)
CREATININE: 5.4 mg/dL — AB (ref 0.61–1.24)
GLUCOSE: 71 mg/dL (ref 70–99)
HCT: 28 % — ABNORMAL LOW (ref 39.0–52.0)
Hemoglobin: 9.5 g/dL — ABNORMAL LOW (ref 13.0–17.0)
Potassium: 4.2 mmol/L (ref 3.5–5.1)
Sodium: 138 mmol/L (ref 135–145)
TCO2: 32 mmol/L (ref 22–32)

## 2018-07-29 SURGERY — A/V FISTULAGRAM
Anesthesia: LOCAL

## 2018-07-29 MED ORDER — LIDOCAINE HCL (PF) 1 % IJ SOLN
INTRAMUSCULAR | Status: AC
  Filled 2018-07-29: qty 30

## 2018-07-29 MED ORDER — SODIUM CHLORIDE 0.9 % IV SOLN: 250.0000 mL | INTRAVENOUS | Status: DC | PRN

## 2018-07-29 MED ORDER — HEPARIN (PORCINE) IN NACL 1000-0.9 UT/500ML-% IV SOLN
INTRAVENOUS | Status: DC | PRN
  Administered 2018-07-29: 500 mL

## 2018-07-29 MED ORDER — IODIXANOL 320 MG/ML IV SOLN
INTRAVENOUS | Status: DC | PRN
  Administered 2018-07-29: 85 mL via INTRAVENOUS

## 2018-07-29 MED ORDER — HEPARIN SODIUM (PORCINE) 1000 UNIT/ML IJ SOLN
INTRAMUSCULAR | Status: AC
  Filled 2018-07-29: qty 1

## 2018-07-29 MED ORDER — SODIUM CHLORIDE 0.9% FLUSH: 3.0000 mL | Freq: Two times a day (BID) | INTRAVENOUS | Status: DC

## 2018-07-29 MED ORDER — SODIUM CHLORIDE 0.9% FLUSH: 3.0000 mL | INTRAVENOUS | Status: DC | PRN

## 2018-07-29 MED ORDER — LIDOCAINE HCL (PF) 1 % IJ SOLN
INTRAMUSCULAR | Status: DC | PRN
  Administered 2018-07-29: 5 mL

## 2018-07-29 MED ORDER — HEPARIN (PORCINE) IN NACL 1000-0.9 UT/500ML-% IV SOLN
INTRAVENOUS | Status: AC
  Filled 2018-07-29: qty 500

## 2018-07-29 MED ORDER — HEPARIN SODIUM (PORCINE) 1000 UNIT/ML IJ SOLN
INTRAMUSCULAR | Status: DC | PRN
  Administered 2018-07-29: 3000 [IU] via INTRAVENOUS

## 2018-07-29 SURGICAL SUPPLY — 21 items
BAG SNAP BAND KOVER 36X36 (MISCELLANEOUS) ×3 IMPLANT
BALLN LUTONIX AV 10X60X75 (BALLOONS) ×3
BALLN MUSTANG 12.0X40 75 (BALLOONS) ×3
BALLN MUSTANG 8X80X75 (BALLOONS) ×3
BALLOON LUTONIX AV 10X60X75 (BALLOONS) ×2 IMPLANT
BALLOON MUSTANG 12.0X40 75 (BALLOONS) ×2 IMPLANT
BALLOON MUSTANG 8X80X75 (BALLOONS) ×2 IMPLANT
COVER DOME SNAP 22 D (MISCELLANEOUS) ×3 IMPLANT
KIT ENCORE 26 ADVANTAGE (KITS) ×3 IMPLANT
KIT MICROPUNCTURE NIT STIFF (SHEATH) ×3 IMPLANT
PROTECTION STATION PRESSURIZED (MISCELLANEOUS) ×3
SHEATH PINNACLE R/O II 7F 4CM (SHEATH) ×3 IMPLANT
SHEATH PROBE COVER 6X72 (BAG) ×3 IMPLANT
STATION PROTECTION PRESSURIZED (MISCELLANEOUS) ×2 IMPLANT
STENT VIABAHN 8X100X120 (Permanent Stent) ×1 IMPLANT
STENT VIABAHN 8X10X120 (Permanent Stent) ×2 IMPLANT
STOPCOCK MORSE 400PSI 3WAY (MISCELLANEOUS) ×3 IMPLANT
TRAY PV CATH (CUSTOM PROCEDURE TRAY) ×3 IMPLANT
TUBING CIL FLEX 10 FLL-RA (TUBING) ×3 IMPLANT
WIRE BENTSON .035X145CM (WIRE) ×3 IMPLANT
WIRE G V18X300CM (WIRE) ×3 IMPLANT

## 2018-07-29 NOTE — Discharge Instructions (Signed)
**Note -identified via Obfuscation** Fistulogram, Care After °Refer to this sheet in the next few weeks. These instructions provide you with information on caring for yourself after your procedure. Your health care provider may also give you more specific instructions. Your treatment has been planned according to current medical practices, but problems sometimes occur. Call your health care provider if you have any problems or questions after your procedure. °What can I expect after the procedure? °After your procedure, it is typical to have the following: °· A small amount of discomfort in the area where the catheters were placed. °· A small amount of bruising around the fistula. °· Sleepiness and fatigue. ° °Follow these instructions at home: °· Rest at home for the day following your procedure. °· Do not drive or operate heavy machinery while taking pain medicine. °· Take medicines only as directed by your health care provider. °· Do not take baths, swim, or use a hot tub until your health care provider approves. You may shower 24 hours after the procedure or as directed by your health care provider. °· There are many different ways to close and cover an incision, including stitches, skin glue, and adhesive strips. Follow your health care provider's instructions on: °? Incision care. °? Bandage (dressing) changes and removal. °? Incision closure removal. °· Monitor your dialysis fistula carefully. °Contact a health care provider if: °· You have drainage, redness, swelling, or pain at your catheter site. °· You have a fever. °· You have chills. °Get help right away if: °· You feel weak. °· You have trouble balancing. °· You have trouble moving your arms or legs. °· You have problems with your speech or vision. °· You can no longer feel a vibration or buzz when you put your fingers over your dialysis fistula. °· The limb that was used for the procedure: °? Swells. °? Is painful. °? Is cold. °? Is discolored, such as blue or pale white. °This  information is not intended to replace advice given to you by your health care provider. Make sure you discuss any questions you have with your health care provider. °Document Released: 04/03/2014 Document Revised: 04/24/2016 Document Reviewed: 01/06/2014 °Elsevier Interactive Patient Education © 2018 [employer]vier Inc. ° °

## 2018-07-29 NOTE — H&P (Signed)
   History and Physical Update  The patient was interviewed and re-examined.  The patient's previous History and Physical has been reviewed and is unchanged from recent evaluation. Plan for left arm fistulogram with possible intervention.   Brandon C. Donzetta Matters, MD [facility] [place] Office: [phone] Pager: [phone]  07/29/2018, 1:45 PM

## 2018-07-29 NOTE — Op Note (Signed)
    Patient name: MCCORMICK MACON MRN: [id number] DOB: Jun 20, 1946 Sex: male  07/29/2018 Pre-operative Diagnosis: esrd, bleeding left arm fistulogram Post-operative diagnosis:  Same Surgeon:  Eda Paschal. Donzetta Matters, MD Procedure Performed: 1.  Ultrasound-guided cannulation left arm fistula 2.  Balloon angioplasty of left subclavian vein with 12 x 40 mm balloon 3.  Stent of left axillary vein with 8 x 100 mm viabahn after 10 mm drug-coated balloon angioplasty  Indications: 72 year old male with end-stage renal disease has had recent bleeding from his left upper arm AV fistula where there is very thin skin.  He is now indicated for fistulogram possible intervention.  Findings: The thoracic outlet had a greater than 50% stenosis that was less than 30% after balloon angioplasty.  There was a very tight stenosis in the axillary vein that ruptured with attempted balloon angioplasty we placed a stent and at completion there is no further residual stenosis.   Procedure:  The patient was identified in the holding area and taken to room 8.  The patient was then placed supine on the table and prepped and draped in the usual sterile fashion.  A time out was called.  Ultrasound was used to evaluate the left arm AV fistula the area was anesthetized with 1% lidocaine and we cannulated this with direct ultrasound guidance with micropuncture needle.  An image was saved the permanent record.  We placed a micropuncture sheath and performed upper extremity shuntogram.  We then exchanged for a 7 French sheath placed a Bentson wire across the central lesion balloon is first with a millimeter balloon followed by 12 mm balloon.  We then placed a millimeter balloon to the level of the axillary vein and inflated this.  We then perform retrograde shots which demonstrated good flow into the arterial anastomosis.  We then used a 10 x 60 millimeters drug-coated balloon in the axillary vein and this unfortunately ruptured the vein.  We  quickly exchanged for an 018 wire brought a 8 mm x 10 cm covered stent to the field and deployed this.  This was postdilated with 8 mm balloon.  Completion demonstrated brisk flow with no residual stenosis throughout the fistula.  There was a palpable thrill.  Wire was removed and a 4 Monocryl was used to suture the area shot.  He tolerated procedure well without immediate complication.  Contrast: 85cc    Brandon C. Donzetta Matters, MD [facility] [place] Office: [phone] Pager: [phone]

## 2018-07-29 NOTE — Progress Notes (Signed)
**Note -Identified via Obfuscation** Patient post A/V balloon angioplasty. No complications noted. Dressing clean, dry and intact. Patient states understanding of discharge and medication instructions. Transportation Engineer, materials) arrived to take patient home/facility ([facility] [place]). All personal items sent with patient. Fall/safety precautions implemented.

## 2018-07-30 ENCOUNTER — Other Ambulatory Visit: Payer: Self-pay | Admitting: *Deleted

## 2018-07-30 ENCOUNTER — Encounter (HOSPITAL_COMMUNITY): Payer: Self-pay | Admitting: Vascular Surgery

## 2018-08-04 ENCOUNTER — Encounter (HOSPITAL_COMMUNITY): Payer: Self-pay | Admitting: Vascular Surgery

## 2018-08-09 DIAGNOSIS — E782 Mixed hyperlipidemia: Secondary | ICD-10-CM | POA: Diagnosis not present

## 2018-08-09 DIAGNOSIS — I10 Essential (primary) hypertension: Secondary | ICD-10-CM | POA: Diagnosis not present

## 2018-08-09 DIAGNOSIS — D631 Anemia in chronic kidney disease: Secondary | ICD-10-CM | POA: Diagnosis not present

## 2018-08-09 DIAGNOSIS — N186 End stage renal disease: Secondary | ICD-10-CM | POA: Diagnosis not present

## 2018-08-10 ENCOUNTER — Encounter: Payer: Self-pay | Admitting: Vascular Surgery

## 2018-08-10 ENCOUNTER — Other Ambulatory Visit: Payer: Self-pay

## 2018-08-10 ENCOUNTER — Ambulatory Visit (INDEPENDENT_AMBULATORY_CARE_PROVIDER_SITE_OTHER): Payer: Medicare Other | Admitting: Vascular Surgery

## 2018-08-10 ENCOUNTER — Ambulatory Visit (HOSPITAL_COMMUNITY)
Admission: RE | Admit: 2018-08-10 | Discharge: 2018-08-10 | Disposition: A | Discharge status: Home / Self Care | Payer: Medicare Other | Source: Ambulatory Visit | Attending: Vascular Surgery | Admitting: Vascular Surgery

## 2018-08-10 VITALS — BP 168/69 | HR 65 | Temp 96.9°F | Resp 16 | Ht 70.0 in | Wt 131.8 lb

## 2018-08-10 DIAGNOSIS — I2510 Atherosclerotic heart disease of native coronary artery without angina pectoris: Secondary | ICD-10-CM | POA: Insufficient documentation

## 2018-08-10 DIAGNOSIS — I739 Peripheral vascular disease, unspecified: Secondary | ICD-10-CM

## 2018-08-10 DIAGNOSIS — I10 Essential (primary) hypertension: Secondary | ICD-10-CM | POA: Insufficient documentation

## 2018-08-10 NOTE — Progress Notes (Signed)
[facility]  Patient name: Trevor [name] MRN: [id number] DOB: 1946-07-24 Sex: male  REASON FOR VISIT: Follow-up right axillofemoral and femorofemoral bypass  HPI: Trevor [name] is a 72 y.o. male here today for follow-up.  He had undergone a prior emergent right ax Pham and femorofemoral bypass on 06/28/2017.  He lives in a nursing facility.  He has very poor understanding and does have some confusion.  He is end-stage renal disease and has had a recent revision of a IV fistula with plication with Dr. Donzetta Matters.  He reports some numbness in his right foot.  He specifically denies any rest pain and denies any tissue loss.  He does not have any numbness in his left foot.  Past Medical History:  Diagnosis Date  . AAA (abdominal aortic aneurysm) ([place])   . Anemia   . Anxiety   . Arthritis   . Blindness   . CHF (congestive heart failure) ([place])   . Coronary artery disease    mild   . 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 ESRD on hemodialysis ([place])    started HD 2014-15  . Headache(784.0)   . HTN (hypertension)   . Protein-calorie malnutrition ([place])   . Stroke [facility]) 01/2018   no residual deficits    Family History  Problem Relation Age of Onset  . Heart attack Mother        MI in her 34s  . Diabetes Mother   . Alcohol abuse Father   . Anesthesia problems Neg Hx   . Hypotension Neg Hx   . Malignant hyperthermia Neg Hx   . Pseudochol deficiency Neg Hx     SOCIAL HISTORY: Social History   Tobacco Use  . Smoking status: Current Every Day Smoker    Packs/day: 0.50    Years: 60.00    Pack years: 30.00    Types: Cigarettes  . Smokeless tobacco: Never Used  . Tobacco comment: 5-6 cigarettes per day  Substance Use Topics  . Alcohol use: No    Alcohol/week: 0.0 standard drinks    Comment: Occasional    No Known Allergies  Current Outpatient Medications    Medication Sig Dispense Refill  . acetaminophen (TYLENOL) 325 MG tablet Take 650 mg by mouth every 6 (six) hours as needed.    Marland Kitchen amLODipine (NORVASC) 10 MG tablet Take 10 mg by mouth at bedtime.   0  . aspirin 81 MG chewable tablet Chew 1 tablet (81 mg total) by mouth daily.    Marland Kitchen atorvastatin (LIPITOR) 40 MG tablet Take 40 mg by mouth at bedtime.    . bisacodyl (DULCOLAX) 10 MG suppository Place 10 mg rectally daily as needed (constipation).     . cinacalcet (SENSIPAR) 90 MG tablet Take 90 mg by mouth daily with supper.    . clopidogrel (PLAVIX) 75 MG tablet Take 1 tablet (75 mg total) by mouth daily. 30 tablet 2  . docusate sodium (COLACE) 100 MG capsule Take 200 mg by mouth at bedtime.     Marland Kitchen doxercalciferol (HECTOROL) 4 MCG/2ML injection Inject 1 mL (2 mcg total) into the vein every Monday, Wednesday, and Friday with hemodialysis. 2 mL   . ferric citrate (AURYXIA) 1 GM 210 MG(Fe) tablet Take 630 mg by mouth 3 (three) times daily with meals.     . gabapentin (NEURONTIN) 300 MG capsule Take 1 capsule (300 mg total) by mouth  at bedtime. 30 capsule 0  . Glycopyrrolate (LONHALA MAGNAIR REFILL KIT) 25 MCG/ML SOLN Inhale 25 mcg into the lungs 2 (two) times daily.    . hydrALAZINE (APRESOLINE) 50 MG tablet Take 50 mg by mouth 3 (three) times daily.    Marland Kitchen ipratropium-albuterol (DUONEB) 0.5-2.5 (3) MG/3ML SOLN Take 3 mLs by nebulization every 8 (eight) hours.    . isosorbide mononitrate (IMDUR) 30 MG 24 hr tablet Take 30 mg by mouth daily.    . metoprolol tartrate (LOPRESSOR) 25 MG tablet Take 1 tablet (25 mg total) by mouth 2 (two) times daily. 60 tablet 5  . Nutritional Supplements (FEEDING SUPPLEMENT, NEPRO CARB STEADY,) LIQD Take 237 mLs by mouth 2 (two) times daily between meals.  0  . omega-3 acid ethyl esters (LOVAZA) 1 g capsule Take 1 g by mouth 2 (two) times daily.    . ondansetron (ZOFRAN) 4 MG tablet Take 4 mg by mouth every 6 (six) hours as needed for nausea or vomiting.    Marland Kitchen  oxyCODONE-acetaminophen (PERCOCET) 5-325 MG tablet Take 1 tablet by mouth every 6 (six) hours as needed for severe pain. 8 tablet 0  . senna (SENOKOT) 8.6 MG TABS tablet Take 1 tablet by mouth daily.     No current facility-administered medications for this visit.     REVIEW OF SYSTEMS:  _0  denotes positive finding, _1  denotes negative finding Cardiac  Comments:  Chest pain or chest pressure:    Shortness of breath upon exertion:    Short of breath when lying flat:    Irregular heart rhythm:        Vascular    Pain in calf, thigh, or hip brought on by ambulation:    Pain in feet at night that wakes you up from your sleep:     Blood clot in your veins:    Leg swelling:           PHYSICAL EXAM: Vitals:   08/10/18 0850 08/10/18 0852  BP: (!) 169/74 (!) 168/69  Pulse: 65 65  Resp: 16   Temp: (!) 96.9 F (36.1 C)   TempSrc: Oral   SpO2: 98%   Weight: 131 lb 13.4 oz (59.8 kg)   Height: _2  (1.778 m)     GENERAL: The patient is a well-nourished male, in no acute distress. The vital signs are documented above. CARDIOVASCULAR: Axillary and groin incisions healed.  Easily palpable axillofemoral and femorofemoral bypasses.  No palpable distal pulses. PULMONARY: There is good air exchange  MUSCULOSKELETAL: There are no major deformities or cyanosis. NEUROLOGIC: No focal weakness or paresthesias are detected. SKIN: There are no ulcers or rashes noted. PSYCHIATRIC: The patient has a normal affect.  DATA:  Noninvasive studies reveal ankle arm index of 0.56 on the right and 0.26 on the left  MEDICAL ISSUES: Known infrainguinal occlusive disease.  Patient has numbness in his right foot but no evidence of critical ischemia.  Does have very poor flow to his left foot but is asymptomatic from this.  He does very little walking.  He specifically denies any pain that wakes him up from his sleep.  I have recommended continued observation only.  Will notify us should he develop any  tissue loss or rest pain.    Rosetta Posner, MD FACS [facility] Tel [phone] Pager [phone]

## 2018-08-10 NOTE — Progress Notes (Signed)
Vitals:   08/10/18 0850  BP: (!) 169/74  Pulse: 65  Resp: 16  Temp: (!) 96.9 F (36.1 C)  TempSrc: Oral  SpO2: 98%  Weight: 131 lb 13.4 oz (59.8 kg)  Height: 5\' 10"  (1.778 m)

## 2018-08-16 NOTE — Progress Notes (Signed)
Provided instructions to Trevor Jewel, LPN at [facility]. Requested that facility send history, MAR that is updated after he gets morning medications with last dose administered visible, medical history, and any cardiac test patient might have had.

## 2018-08-16 NOTE — Progress Notes (Signed)
Notified Becky at [facility] that patient had not stopped Plavix. States that it is ok.

## 2018-08-17 ENCOUNTER — Other Ambulatory Visit: Payer: Self-pay

## 2018-08-17 ENCOUNTER — Encounter (HOSPITAL_COMMUNITY)
Admission: RE | Disposition: A | Discharge status: Skilled Nursing Facility | Payer: Self-pay | Source: Ambulatory Visit | Attending: Vascular Surgery

## 2018-08-17 ENCOUNTER — Ambulatory Visit (HOSPITAL_COMMUNITY): Payer: Medicare Other

## 2018-08-17 ENCOUNTER — Ambulatory Visit (HOSPITAL_COMMUNITY)
Admission: RE | Admit: 2018-08-17 | Discharge: 2018-08-17 | Disposition: A | Discharge status: Skilled Nursing Facility | Payer: Medicare Other | Source: Ambulatory Visit | Attending: Vascular Surgery | Admitting: Vascular Surgery

## 2018-08-17 ENCOUNTER — Encounter (HOSPITAL_COMMUNITY): Payer: Self-pay

## 2018-08-17 DIAGNOSIS — Z9889 Other specified postprocedural states: Secondary | ICD-10-CM | POA: Diagnosis not present

## 2018-08-17 DIAGNOSIS — T82838A Hemorrhage of vascular prosthetic devices, implants and grafts, initial encounter: Secondary | ICD-10-CM | POA: Diagnosis not present

## 2018-08-17 DIAGNOSIS — E46 Unspecified protein-calorie malnutrition: Secondary | ICD-10-CM | POA: Insufficient documentation

## 2018-08-17 DIAGNOSIS — Z7982 Long term (current) use of aspirin: Secondary | ICD-10-CM | POA: Diagnosis not present

## 2018-08-17 DIAGNOSIS — F1411 Cocaine abuse, in remission: Secondary | ICD-10-CM | POA: Diagnosis not present

## 2018-08-17 DIAGNOSIS — Z8673 Personal history of transient ischemic attack (TIA), and cerebral infarction without residual deficits: Secondary | ICD-10-CM | POA: Insufficient documentation

## 2018-08-17 DIAGNOSIS — D631 Anemia in chronic kidney disease: Secondary | ICD-10-CM | POA: Insufficient documentation

## 2018-08-17 DIAGNOSIS — I714 Abdominal aortic aneurysm, without rupture: Secondary | ICD-10-CM | POA: Insufficient documentation

## 2018-08-17 DIAGNOSIS — N400 Enlarged prostate without lower urinary tract symptoms: Secondary | ICD-10-CM | POA: Diagnosis not present

## 2018-08-17 DIAGNOSIS — F1011 Alcohol abuse, in remission: Secondary | ICD-10-CM | POA: Diagnosis not present

## 2018-08-17 DIAGNOSIS — F17210 Nicotine dependence, cigarettes, uncomplicated: Secondary | ICD-10-CM | POA: Diagnosis not present

## 2018-08-17 DIAGNOSIS — H5461 Unqualified visual loss, right eye, normal vision left eye: Secondary | ICD-10-CM | POA: Insufficient documentation

## 2018-08-17 DIAGNOSIS — Z681 Body mass index (BMI) 19 or less, adult: Secondary | ICD-10-CM | POA: Insufficient documentation

## 2018-08-17 DIAGNOSIS — E785 Hyperlipidemia, unspecified: Secondary | ICD-10-CM | POA: Insufficient documentation

## 2018-08-17 DIAGNOSIS — Z955 Presence of coronary angioplasty implant and graft: Secondary | ICD-10-CM | POA: Diagnosis not present

## 2018-08-17 DIAGNOSIS — Z79899 Other long term (current) drug therapy: Secondary | ICD-10-CM | POA: Diagnosis not present

## 2018-08-17 DIAGNOSIS — M1990 Unspecified osteoarthritis, unspecified site: Secondary | ICD-10-CM | POA: Insufficient documentation

## 2018-08-17 DIAGNOSIS — N186 End stage renal disease: Secondary | ICD-10-CM | POA: Diagnosis not present

## 2018-08-17 DIAGNOSIS — I132 Hypertensive heart and chronic kidney disease with heart failure and with stage 5 chronic kidney disease, or end stage renal disease: Secondary | ICD-10-CM | POA: Diagnosis not present

## 2018-08-17 DIAGNOSIS — I130 Hypertensive heart and chronic kidney disease with heart failure and stage 1 through stage 4 chronic kidney disease, or unspecified chronic kidney disease: Secondary | ICD-10-CM | POA: Insufficient documentation

## 2018-08-17 DIAGNOSIS — I2510 Atherosclerotic heart disease of native coronary artery without angina pectoris: Secondary | ICD-10-CM | POA: Insufficient documentation

## 2018-08-17 DIAGNOSIS — I6529 Occlusion and stenosis of unspecified carotid artery: Secondary | ICD-10-CM | POA: Insufficient documentation

## 2018-08-17 DIAGNOSIS — Z7902 Long term (current) use of antithrombotics/antiplatelets: Secondary | ICD-10-CM | POA: Insufficient documentation

## 2018-08-17 DIAGNOSIS — Y832 Surgical operation with anastomosis, bypass or graft as the cause of abnormal reaction of the patient, or of later complication, without mention of misadventure at the time of the procedure: Secondary | ICD-10-CM | POA: Insufficient documentation

## 2018-08-17 DIAGNOSIS — Z992 Dependence on renal dialysis: Secondary | ICD-10-CM | POA: Insufficient documentation

## 2018-08-17 DIAGNOSIS — I5042 Chronic combined systolic (congestive) and diastolic (congestive) heart failure: Secondary | ICD-10-CM | POA: Insufficient documentation

## 2018-08-17 HISTORY — PX: REVISON OF ARTERIOVENOUS FISTULA: SHX6074

## 2018-08-17 LAB — POCT I-STAT 4, (NA,K, GLUC, HGB,HCT)
Glucose, Bld: 76 mg/dL (ref 70–99)
HCT: 27 % — ABNORMAL LOW (ref 39.0–52.0)
HEMOGLOBIN: 9.2 g/dL — AB (ref 13.0–17.0)
POTASSIUM: 3.9 mmol/L (ref 3.5–5.1)
Sodium: 138 mmol/L (ref 135–145)

## 2018-08-17 SURGERY — REVISON OF ARTERIOVENOUS FISTULA
Anesthesia: Monitor Anesthesia Care | Site: Arm Upper | Laterality: Left

## 2018-08-17 MED ORDER — CEFAZOLIN SODIUM-DEXTROSE 2-4 GM/100ML-% IV SOLN
INTRAVENOUS | Status: AC
  Filled 2018-08-17: qty 100

## 2018-08-17 MED ORDER — CEFAZOLIN SODIUM-DEXTROSE 2-3 GM-%(50ML) IV SOLR
INTRAVENOUS | Status: DC | PRN
  Administered 2018-08-17: 2 g via INTRAVENOUS

## 2018-08-17 MED ORDER — LACTATED RINGERS IV SOLN: INTRAVENOUS | Status: DC

## 2018-08-17 MED ORDER — PROPOFOL 500 MG/50ML IV EMUL
INTRAVENOUS | Status: DC | PRN
  Administered 2018-08-17: 20 ug/kg/min via INTRAVENOUS

## 2018-08-17 MED ORDER — HYDROCODONE-ACETAMINOPHEN 5-325 MG PO TABS: 1.0000 | ORAL_TABLET | Freq: Four times a day (QID) | ORAL | 0 refills | Status: DC | PRN

## 2018-08-17 MED ORDER — MIDAZOLAM HCL 5 MG/5ML IJ SOLN
INTRAMUSCULAR | Status: DC | PRN
  Administered 2018-08-17: 1 mg via INTRAVENOUS

## 2018-08-17 MED ORDER — PHENYLEPHRINE HCL 10 MG/ML IJ SOLN
INTRAMUSCULAR | Status: DC | PRN
  Administered 2018-08-17 (×2): 40 ug via INTRAVENOUS

## 2018-08-17 MED ORDER — LIDOCAINE-EPINEPHRINE (PF) 1 %-1:200000 IJ SOLN
INTRAMUSCULAR | Status: DC | PRN
  Administered 2018-08-17: 10 mL

## 2018-08-17 MED ORDER — PROPOFOL 10 MG/ML IV BOLUS
INTRAVENOUS | Status: DC | PRN
  Administered 2018-08-17 (×2): 20 mg via INTRAVENOUS

## 2018-08-17 MED ORDER — SODIUM CHLORIDE 0.9 % IV SOLN
INTRAVENOUS | Status: DC
  Administered 2018-08-17: 11:00:00 via INTRAVENOUS

## 2018-08-17 MED ORDER — SODIUM CHLORIDE 0.9 % IV SOLN
INTRAVENOUS | Status: AC
  Filled 2018-08-17: qty 1.2

## 2018-08-17 MED ORDER — 0.9 % SODIUM CHLORIDE (POUR BTL) OPTIME
TOPICAL | Status: DC | PRN
  Administered 2018-08-17: 1000 mL

## 2018-08-17 MED ORDER — ONDANSETRON HCL 4 MG/2ML IJ SOLN
INTRAMUSCULAR | Status: AC
  Filled 2018-08-17: qty 2

## 2018-08-17 MED ORDER — LIDOCAINE 2% (20 MG/ML) 5 ML SYRINGE
INTRAMUSCULAR | Status: AC
  Filled 2018-08-17: qty 5

## 2018-08-17 MED ORDER — MIDAZOLAM HCL 2 MG/2ML IJ SOLN
INTRAMUSCULAR | Status: AC
  Filled 2018-08-17: qty 2

## 2018-08-17 MED ORDER — SODIUM CHLORIDE 0.9 % IV SOLN
INTRAVENOUS | Status: DC | PRN
  Administered 2018-08-17: 500 mL

## 2018-08-17 MED ORDER — FENTANYL CITRATE (PF) 250 MCG/5ML IJ SOLN
INTRAMUSCULAR | Status: AC
  Filled 2018-08-17: qty 5

## 2018-08-17 MED ORDER — LIDOCAINE-EPINEPHRINE (PF) 1 %-1:200000 IJ SOLN
INTRAMUSCULAR | Status: AC
  Filled 2018-08-17: qty 30

## 2018-08-17 MED ORDER — FENTANYL CITRATE (PF) 100 MCG/2ML IJ SOLN
INTRAMUSCULAR | Status: DC | PRN
  Administered 2018-08-17: 50 ug via INTRAVENOUS

## 2018-08-17 MED ORDER — FENTANYL CITRATE (PF) 100 MCG/2ML IJ SOLN: 25.0000 ug | INTRAMUSCULAR | Status: DC | PRN

## 2018-08-17 MED ORDER — CEFAZOLIN SODIUM-DEXTROSE 2-4 GM/100ML-% IV SOLN: 2.0000 g | INTRAVENOUS | Status: DC

## 2018-08-17 MED ORDER — ONDANSETRON HCL 4 MG/2ML IJ SOLN
INTRAMUSCULAR | Status: DC | PRN
  Administered 2018-08-17: 4 mg via INTRAVENOUS

## 2018-08-17 SURGICAL SUPPLY — 27 items
ARMBAND PINK RESTRICT EXTREMIT (MISCELLANEOUS) ×3 IMPLANT
CANISTER SUCT 3000ML PPV (MISCELLANEOUS) ×3 IMPLANT
CLIP VESOCCLUDE MED 6/CT (CLIP) ×3 IMPLANT
CLIP VESOCCLUDE SM WIDE 6/CT (CLIP) ×3 IMPLANT
COVER PROBE W GEL 5X96 (DRAPES) ×3 IMPLANT
DERMABOND ADVANCED (GAUZE/BANDAGES/DRESSINGS) ×2
DERMABOND ADVANCED .7 DNX12 (GAUZE/BANDAGES/DRESSINGS) ×1 IMPLANT
ELECT REM PT RETURN 9FT ADLT (ELECTROSURGICAL) ×3
ELECTRODE REM PT RTRN 9FT ADLT (ELECTROSURGICAL) ×1 IMPLANT
GLOVE BIO SURGEON STRL SZ7.5 (GLOVE) ×3 IMPLANT
GOWN STRL REUS W/ TWL LRG LVL3 (GOWN DISPOSABLE) ×2 IMPLANT
GOWN STRL REUS W/ TWL XL LVL3 (GOWN DISPOSABLE) ×1 IMPLANT
GOWN STRL REUS W/TWL LRG LVL3 (GOWN DISPOSABLE) ×4
GOWN STRL REUS W/TWL XL LVL3 (GOWN DISPOSABLE) ×2
KIT BASIN OR (CUSTOM PROCEDURE TRAY) ×3 IMPLANT
KIT TURNOVER KIT B (KITS) ×3 IMPLANT
NS IRRIG 1000ML POUR BTL (IV SOLUTION) ×3 IMPLANT
PACK CV ACCESS (CUSTOM PROCEDURE TRAY) ×3 IMPLANT
PAD ARMBOARD 7.5X6 YLW CONV (MISCELLANEOUS) ×6 IMPLANT
SUT MNCRL AB 4-0 PS2 18 (SUTURE) ×3 IMPLANT
SUT PROLENE 5 0 C 1 36 (SUTURE) ×6 IMPLANT
SUT PROLENE 6 0 BV (SUTURE) ×3 IMPLANT
SUT VIC AB 3-0 SH 27 (SUTURE) ×2
SUT VIC AB 3-0 SH 27X BRD (SUTURE) ×1 IMPLANT
TOWEL GREEN STERILE (TOWEL DISPOSABLE) ×3 IMPLANT
UNDERPAD 30X30 (UNDERPADS AND DIAPERS) ×3 IMPLANT
WATER STERILE IRR 1000ML POUR (IV SOLUTION) ×3 IMPLANT

## 2018-08-17 NOTE — Anesthesia Procedure Notes (Signed)
Procedure Name: MAC Date/Time: 08/17/2018 2:11 PM Performed by: Lance Coon, CRNA Pre-anesthesia Checklist: Patient identified, Emergency Drugs available, Suction available, Patient being monitored and Timeout performed Patient Re-evaluated:Patient Re-evaluated prior to induction Oxygen Delivery Method: Simple face mask

## 2018-08-17 NOTE — Progress Notes (Signed)
Glucose through [facility] was 76. Pt also had coffee with cream and sugar this AM around  0700. Dr. Lanetta Inch made aware. Will proceed with surgery. No new orders.

## 2018-08-17 NOTE — Op Note (Signed)
    Patient name: Trevor [name] MRN: [id number] DOB: 02-Apr-1946 Sex: male  08/17/2018 Pre-operative Diagnosis: End-stage renal disease, bleeding from left upper arm AV fistula Post-operative diagnosis:  Same Surgeon:  Eda Paschal. Donzetta Matters, MD Assistant: Arlee Muslim, PA Procedure Performed: Revision of left upper arm AV fistula with plication  Indications: 72 year old male has a history of left upper arm AV fistula that previously had malfunction required plication of the pseudoaneurysm.  He subsequently undergone stenting of his axillary vein on the left now has had persistent bleeding from one area of the fistula where there is thin skin and it is consistently cannulated.  He is now indicated for revision.  Findings: Skin was intimately involved with the fistula for a short segment cephalad aspect.  This was excised the fascia with primarily plicated and at completion there was a palpable thrill as well as a palpable radial pulse at the wrist.   Procedure:  The patient was identified in the holding area and taken to the operating room where is placed supine on the operating table and MAC anesthesia was induced.  He was sterilely prepped and draped in the left upper extremity usual fashion and antibiotics were given.  Timeout was called.  Began with injection 1% lidocaine with epinephrine to a total of 10 cc around the affected area.  We then made an elliptical incision around this area and excised.  We got proximal distal control of the fistula and then opened it.  Heparinized saline was flushed in both directions.  We then excised the skin off the fistula primarily repaired our fistulotomy with running 5-0 Prolene suture in a mattress configuration.  At completion there was a palpable thrill as well as a radial pulse at the wrist both confirmed with Doppler.  We then freed up some of the skin closed a layer over the fistula with 3-0 Vicryl running followed by 4-0 Monocryl at the level of the Skin Place,  Dermabond above that.  He was allowed away from anesthesia having tolerated procedure well without immediate complication.  All counts were correct at completion.  EBL: 50 cc   Antwain Caliendo C. Donzetta Matters, MD [facility] [place] Office: [phone] Pager: [phone]

## 2018-08-17 NOTE — H&P (Signed)
   History and Physical Update  The patient was interviewed and re-examined.  The patient's previous History and Physical has been reviewed and is unchanged from recent evaluation.  Plan is for left arm AV fistula revision.  Londa Mackowski C. Donzetta Matters, MD [facility] [place] Office: [phone] Pager: [phone]   08/17/2018, 1:05 PM

## 2018-08-17 NOTE — Anesthesia Preprocedure Evaluation (Addendum)
Anesthesia Evaluation  Patient identified by MRN, date of birth, ID band Patient awake    Reviewed: Allergy & Precautions, NPO status , Patient's Chart, lab work & pertinent test results  Airway Mallampati: II  TM Distance: >3 FB Neck ROM: Full    Dental  (+) Edentulous Upper, Edentulous Lower   Pulmonary Current Smoker,    Pulmonary exam normal breath sounds clear to auscultation       Cardiovascular hypertension, Pt. on medications and Pt. on home beta blockers + CAD, + Peripheral Vascular Disease (s/p femorofemoral bypass 05/2017) and +CHF  Normal cardiovascular exam Rhythm:Regular Rate:Normal  TTE 01/2018 - Left ventricle: The cavity size was normal. Wall thickness was increased in a pattern of moderate LVH. Systolic function was mildly reduced. The estimated ejection fraction was in the range of 45% to 50%. Diffuse hypokinesis.  - Aortic valve: Trileaflet; mildly thickened, mildly calcified   leaflets. - Mitral valve: Moderately calcified annulus. - Left atrium: The atrium was mildly dilated.  LHC 2017 Prox Cx to Mid Cx lesion, 60 %stenosed. 1st Diag lesion, 60 %stenosed.   Neuro/Psych Anxiety CVA (blindness), Residual Symptoms    GI/Hepatic negative GI ROS, Neg liver ROS, (+)     substance abuse  alcohol use and cocaine use, Last use 2014   Endo/Other    Renal/GU Dialysis and ESRFRenal diseaseDialysis MWF, had dialysis yesterday  negative genitourinary   Musculoskeletal  (+) Arthritis ,   Abdominal   Peds  Hematology  (+) anemia ,   Anesthesia Other Findings On plavix  Reproductive/Obstetrics                            Anesthesia Physical Anesthesia Plan  ASA: IV  Anesthesia Plan: General   Post-op Pain Management:    Induction: Intravenous  PONV Risk Score and Plan: 1 and Dexamethasone, Ondansetron and Treatment may vary due to age or medical condition  Airway  Management Planned: LMA and Oral ETT  Additional Equipment:   Intra-op Plan:   Post-operative Plan: Extubation in OR  Informed Consent: I have reviewed the patients History and Physical, chart, labs and discussed the procedure including the risks, benefits and alternatives for the proposed anesthesia with the patient or authorized representative who has indicated his/her understanding and acceptance.   Dental advisory given  Plan Discussed with: CRNA  Anesthesia Plan Comments:         Anesthesia Quick Evaluation

## 2018-08-17 NOTE — Transfer of Care (Signed)
Immediate Anesthesia Transfer of Care Note  Patient: Trevor [name]  Procedure(s) Performed: REVISION OF ARTERIOVENOUS FISTULA LEFT ARM (Left Arm Upper)  Patient Location: PACU  Anesthesia Type:MAC  Level of Consciousness: awake and patient cooperative  Airway & Oxygen Therapy: Patient Spontanous Breathing  Post-op Assessment: Report given to RN and Post -op Vital signs reviewed and stable  Post vital signs: Reviewed and stable  Last Vitals:  Vitals Value Taken Time  BP 97/49 08/17/2018  3:00 PM  Temp    Pulse 76 08/17/2018  3:02 PM  Resp 15 08/17/2018  3:02 PM  SpO2 95 % 08/17/2018  3:02 PM  Vitals shown include unvalidated device data.  Last Pain:  Vitals:   08/17/18 1005  TempSrc: Oral         Complications: No apparent anesthesia complications

## 2018-08-17 NOTE — Anesthesia Postprocedure Evaluation (Signed)
Anesthesia Post Note  Patient: DEERIC CRUISE  Procedure(s) Performed: REVISION OF ARTERIOVENOUS FISTULA LEFT ARM (Left Arm Upper)     Patient location during evaluation: PACU Anesthesia Type: MAC Level of consciousness: awake and alert Pain management: pain level controlled Vital Signs Assessment: post-procedure vital signs reviewed and stable Respiratory status: spontaneous breathing, nonlabored ventilation, respiratory function stable and patient connected to nasal cannula oxygen Cardiovascular status: stable and blood pressure returned to baseline Postop Assessment: no apparent nausea or vomiting Anesthetic complications: no    Last Vitals:  Vitals:   08/17/18 1500 08/17/18 1600  BP: (!) 97/49 (!) 151/74  Pulse: (!) 25 87  Resp: 13 13  Temp: 36.6 C 36.5 C  SpO2: 100% 96%    Last Pain:  Vitals:   08/17/18 1600  TempSrc:   PainSc: 0-No pain                 Chelsey L Woodrum

## 2018-08-18 ENCOUNTER — Encounter (HOSPITAL_COMMUNITY): Payer: Self-pay | Admitting: Vascular Surgery

## 2018-08-19 ENCOUNTER — Emergency Department (HOSPITAL_COMMUNITY): Payer: Medicare Other

## 2018-08-19 ENCOUNTER — Observation Stay (HOSPITAL_COMMUNITY)
Admission: EM | Admit: 2018-08-19 | Discharge: 2018-08-20 | Disposition: A | Discharge status: Skilled Nursing Facility | Payer: Medicare Other | Attending: Internal Medicine | Admitting: Internal Medicine

## 2018-08-19 ENCOUNTER — Other Ambulatory Visit: Payer: Self-pay

## 2018-08-19 DIAGNOSIS — Z7982 Long term (current) use of aspirin: Secondary | ICD-10-CM | POA: Insufficient documentation

## 2018-08-19 DIAGNOSIS — Z79899 Other long term (current) drug therapy: Secondary | ICD-10-CM | POA: Insufficient documentation

## 2018-08-19 DIAGNOSIS — D631 Anemia in chronic kidney disease: Secondary | ICD-10-CM | POA: Diagnosis not present

## 2018-08-19 DIAGNOSIS — F1410 Cocaine abuse, uncomplicated: Secondary | ICD-10-CM | POA: Diagnosis not present

## 2018-08-19 DIAGNOSIS — R000 Tachycardia, unspecified: Secondary | ICD-10-CM | POA: Diagnosis not present

## 2018-08-19 DIAGNOSIS — J810 Acute pulmonary edema: Secondary | ICD-10-CM | POA: Diagnosis present

## 2018-08-19 DIAGNOSIS — J9691 Respiratory failure, unspecified with hypoxia: Secondary | ICD-10-CM | POA: Diagnosis present

## 2018-08-19 DIAGNOSIS — M1990 Unspecified osteoarthritis, unspecified site: Secondary | ICD-10-CM | POA: Diagnosis not present

## 2018-08-19 DIAGNOSIS — I700 Atherosclerosis of aorta: Secondary | ICD-10-CM | POA: Insufficient documentation

## 2018-08-19 DIAGNOSIS — E785 Hyperlipidemia, unspecified: Secondary | ICD-10-CM | POA: Diagnosis not present

## 2018-08-19 DIAGNOSIS — N2581 Secondary hyperparathyroidism of renal origin: Secondary | ICD-10-CM | POA: Diagnosis not present

## 2018-08-19 DIAGNOSIS — Z8249 Family history of ischemic heart disease and other diseases of the circulatory system: Secondary | ICD-10-CM | POA: Insufficient documentation

## 2018-08-19 DIAGNOSIS — I639 Cerebral infarction, unspecified: Secondary | ICD-10-CM | POA: Diagnosis not present

## 2018-08-19 DIAGNOSIS — I2510 Atherosclerotic heart disease of native coronary artery without angina pectoris: Secondary | ICD-10-CM | POA: Diagnosis not present

## 2018-08-19 DIAGNOSIS — I5042 Chronic combined systolic (congestive) and diastolic (congestive) heart failure: Secondary | ICD-10-CM

## 2018-08-19 DIAGNOSIS — Z992 Dependence on renal dialysis: Secondary | ICD-10-CM | POA: Insufficient documentation

## 2018-08-19 DIAGNOSIS — I4581 Long QT syndrome: Secondary | ICD-10-CM | POA: Diagnosis not present

## 2018-08-19 DIAGNOSIS — H547 Unspecified visual loss: Secondary | ICD-10-CM | POA: Insufficient documentation

## 2018-08-19 DIAGNOSIS — I4891 Unspecified atrial fibrillation: Secondary | ICD-10-CM | POA: Diagnosis not present

## 2018-08-19 DIAGNOSIS — R0603 Acute respiratory distress: Secondary | ICD-10-CM

## 2018-08-19 DIAGNOSIS — I10 Essential (primary) hypertension: Secondary | ICD-10-CM

## 2018-08-19 DIAGNOSIS — I120 Hypertensive chronic kidney disease with stage 5 chronic kidney disease or end stage renal disease: Secondary | ICD-10-CM | POA: Diagnosis not present

## 2018-08-19 DIAGNOSIS — Z833 Family history of diabetes mellitus: Secondary | ICD-10-CM | POA: Insufficient documentation

## 2018-08-19 DIAGNOSIS — R0902 Hypoxemia: Secondary | ICD-10-CM | POA: Diagnosis not present

## 2018-08-19 DIAGNOSIS — F17210 Nicotine dependence, cigarettes, uncomplicated: Secondary | ICD-10-CM | POA: Diagnosis not present

## 2018-08-19 DIAGNOSIS — I5043 Acute on chronic combined systolic (congestive) and diastolic (congestive) heart failure: Secondary | ICD-10-CM | POA: Diagnosis not present

## 2018-08-19 DIAGNOSIS — F17200 Nicotine dependence, unspecified, uncomplicated: Secondary | ICD-10-CM

## 2018-08-19 DIAGNOSIS — Z8673 Personal history of transient ischemic attack (TIA), and cerebral infarction without residual deficits: Secondary | ICD-10-CM

## 2018-08-19 DIAGNOSIS — R0602 Shortness of breath: Secondary | ICD-10-CM | POA: Diagnosis not present

## 2018-08-19 DIAGNOSIS — N189 Chronic kidney disease, unspecified: Secondary | ICD-10-CM | POA: Diagnosis not present

## 2018-08-19 DIAGNOSIS — F419 Anxiety disorder, unspecified: Secondary | ICD-10-CM | POA: Insufficient documentation

## 2018-08-19 DIAGNOSIS — N400 Enlarged prostate without lower urinary tract symptoms: Secondary | ICD-10-CM | POA: Insufficient documentation

## 2018-08-19 DIAGNOSIS — J80 Acute respiratory distress syndrome: Secondary | ICD-10-CM | POA: Diagnosis not present

## 2018-08-19 DIAGNOSIS — E8889 Other specified metabolic disorders: Secondary | ICD-10-CM | POA: Insufficient documentation

## 2018-08-19 DIAGNOSIS — I714 Abdominal aortic aneurysm, without rupture: Secondary | ICD-10-CM | POA: Diagnosis not present

## 2018-08-19 DIAGNOSIS — R0789 Other chest pain: Secondary | ICD-10-CM | POA: Diagnosis not present

## 2018-08-19 DIAGNOSIS — R079 Chest pain, unspecified: Secondary | ICD-10-CM | POA: Insufficient documentation

## 2018-08-19 DIAGNOSIS — I132 Hypertensive heart and chronic kidney disease with heart failure and with stage 5 chronic kidney disease, or end stage renal disease: Secondary | ICD-10-CM | POA: Diagnosis not present

## 2018-08-19 DIAGNOSIS — Z720 Tobacco use: Secondary | ICD-10-CM | POA: Diagnosis present

## 2018-08-19 DIAGNOSIS — Z681 Body mass index (BMI) 19 or less, adult: Secondary | ICD-10-CM | POA: Insufficient documentation

## 2018-08-19 DIAGNOSIS — E43 Unspecified severe protein-calorie malnutrition: Secondary | ICD-10-CM | POA: Insufficient documentation

## 2018-08-19 DIAGNOSIS — Z811 Family history of alcohol abuse and dependence: Secondary | ICD-10-CM | POA: Insufficient documentation

## 2018-08-19 DIAGNOSIS — N186 End stage renal disease: Secondary | ICD-10-CM | POA: Insufficient documentation

## 2018-08-19 DIAGNOSIS — E1122 Type 2 diabetes mellitus with diabetic chronic kidney disease: Secondary | ICD-10-CM | POA: Diagnosis not present

## 2018-08-19 DIAGNOSIS — I160 Hypertensive urgency: Secondary | ICD-10-CM | POA: Diagnosis not present

## 2018-08-19 DIAGNOSIS — Z7902 Long term (current) use of antithrombotics/antiplatelets: Secondary | ICD-10-CM | POA: Insufficient documentation

## 2018-08-19 DIAGNOSIS — J9601 Acute respiratory failure with hypoxia: Secondary | ICD-10-CM | POA: Diagnosis not present

## 2018-08-19 DIAGNOSIS — I6529 Occlusion and stenosis of unspecified carotid artery: Secondary | ICD-10-CM | POA: Insufficient documentation

## 2018-08-19 LAB — I-STAT CHEM 8, ED
BUN: 32 mg/dL — AB (ref 8–23)
Calcium, Ion: 1.1 mmol/L — ABNORMAL LOW (ref 1.15–1.40)
Chloride: 99 mmol/L (ref 98–111)
Creatinine, Ser: 7.8 mg/dL — ABNORMAL HIGH (ref 0.61–1.24)
Glucose, Bld: 190 mg/dL — ABNORMAL HIGH (ref 70–99)
HCT: 32 % — ABNORMAL LOW (ref 39.0–52.0)
HEMOGLOBIN: 10.9 g/dL — AB (ref 13.0–17.0)
Potassium: 4.1 mmol/L (ref 3.5–5.1)
SODIUM: 136 mmol/L (ref 135–145)
TCO2: 25 mmol/L (ref 22–32)

## 2018-08-19 LAB — BASIC METABOLIC PANEL
Anion gap: 18 — ABNORMAL HIGH (ref 5–15)
BUN: 29 mg/dL — ABNORMAL HIGH (ref 8–23)
CALCIUM: 9.7 mg/dL (ref 8.9–10.3)
CO2: 23 mmol/L (ref 22–32)
Chloride: 96 mmol/L — ABNORMAL LOW (ref 98–111)
Creatinine, Ser: 7.3 mg/dL — ABNORMAL HIGH (ref 0.61–1.24)
GFR calc Af Amer: 8 mL/min — ABNORMAL LOW (ref >60)
GFR, EST NON AFRICAN AMERICAN: 7 mL/min — AB (ref >60)
Glucose, Bld: 191 mg/dL — ABNORMAL HIGH (ref 70–99)
POTASSIUM: 4.1 mmol/L (ref 3.5–5.1)
SODIUM: 137 mmol/L (ref 135–145)

## 2018-08-19 LAB — CBC WITH DIFFERENTIAL/PLATELET
ABS IMMATURE GRANULOCYTES: 0 10*3/uL (ref 0.0–0.1)
BASOS ABS: 0.1 10*3/uL (ref 0.0–0.1)
BASOS PCT: 1 %
Eosinophils Absolute: 0.3 10*3/uL (ref 0.0–0.7)
Eosinophils Relative: 4 %
HCT: 31.8 % — ABNORMAL LOW (ref 39.0–52.0)
Hemoglobin: 10.1 g/dL — ABNORMAL LOW (ref 13.0–17.0)
Immature Granulocytes: 1 %
Lymphocytes Relative: 22 %
Lymphs Abs: 1.7 10*3/uL (ref 0.7–4.0)
MCH: 29 pg (ref 26.0–34.0)
MCHC: 31.8 g/dL (ref 30.0–36.0)
MCV: 91.4 fL (ref 78.0–100.0)
Monocytes Absolute: 0.6 10*3/uL (ref 0.1–1.0)
Monocytes Relative: 8 %
NEUTROS ABS: 4.9 10*3/uL (ref 1.7–7.7)
NEUTROS PCT: 64 %
PLATELETS: 189 10*3/uL (ref 150–400)
RBC: 3.48 MIL/uL — AB (ref 4.22–5.81)
RDW: 14.2 % (ref 11.5–15.5)
WBC: 7.7 10*3/uL (ref 4.0–10.5)

## 2018-08-19 LAB — I-STAT VENOUS BLOOD GAS, ED
Bicarbonate: 25.8 mmol/L (ref 20.0–28.0)
O2 SAT: 70 %
PCO2 VEN: 45 mmHg (ref 44.0–60.0)
TCO2: 27 mmol/L (ref 22–32)
pH, Ven: 7.367 (ref 7.250–7.430)
pO2, Ven: 38 mmHg (ref 32.0–45.0)

## 2018-08-19 LAB — I-STAT TROPONIN, ED: TROPONIN I, POC: 0.06 ng/mL (ref 0.00–0.08)

## 2018-08-19 LAB — BRAIN NATRIURETIC PEPTIDE: B Natriuretic Peptide: >4500 pg/mL — ABNORMAL HIGH (ref 0.0–100.0)

## 2018-08-19 MED ORDER — DOCUSATE SODIUM 100 MG PO CAPS: 200.0000 mg | ORAL_CAPSULE | Freq: Every day | ORAL | Status: DC | PRN

## 2018-08-19 MED ORDER — NITROGLYCERIN 0.4 MG SL SUBL
0.4000 mg | SUBLINGUAL_TABLET | SUBLINGUAL | Status: DC | PRN
  Filled 2018-08-19: qty 1

## 2018-08-19 MED ORDER — DOXERCALCIFEROL 4 MCG/2ML IV SOLN
3.0000 ug | INTRAVENOUS | Status: DC
  Administered 2018-08-20: 3 ug via INTRAVENOUS

## 2018-08-19 MED ORDER — LIDOCAINE HCL (PF) 1 % IJ SOLN: 5.0000 mL | INTRAMUSCULAR | Status: DC | PRN

## 2018-08-19 MED ORDER — LIDOCAINE-PRILOCAINE 2.5-2.5 % EX CREA: 1.0000 "application " | TOPICAL_CREAM | CUTANEOUS | Status: DC | PRN

## 2018-08-19 MED ORDER — MORPHINE SULFATE (PF) 2 MG/ML IV SOLN: 1.0000 mg | INTRAVENOUS | Status: DC | PRN

## 2018-08-19 MED ORDER — FERRIC CITRATE 1 GM 210 MG(FE) PO TABS
630.0000 mg | ORAL_TABLET | Freq: Three times a day (TID) | ORAL | Status: DC
  Filled 2018-08-19: qty 3

## 2018-08-19 MED ORDER — BISACODYL 10 MG RE SUPP: 10.0000 mg | Freq: Every day | RECTAL | Status: DC | PRN

## 2018-08-19 MED ORDER — SENNOSIDES-DOCUSATE SODIUM 8.6-50 MG PO TABS: 1.0000 | ORAL_TABLET | Freq: Every evening | ORAL | Status: DC | PRN

## 2018-08-19 MED ORDER — ALBUMIN HUMAN 25 % IV SOLN
25.0000 g | Freq: Once | INTRAVENOUS | Status: AC
  Administered 2018-08-19: 25 g via INTRAVENOUS

## 2018-08-19 MED ORDER — CLOPIDOGREL BISULFATE 75 MG PO TABS
75.0000 mg | ORAL_TABLET | Freq: Every day | ORAL | Status: DC
  Administered 2018-08-20: 75 mg via ORAL
  Filled 2018-08-19: qty 1

## 2018-08-19 MED ORDER — NEPRO/CARBSTEADY PO LIQD
237.0000 mL | Freq: Two times a day (BID) | ORAL | Status: DC
  Administered 2018-08-20: 237 mL via ORAL
  Filled 2018-08-19 (×2): qty 237

## 2018-08-19 MED ORDER — SENNA 8.6 MG PO TABS
1.0000 | ORAL_TABLET | Freq: Every day | ORAL | Status: DC
  Administered 2018-08-20: 8.6 mg via ORAL
  Filled 2018-08-19: qty 1

## 2018-08-19 MED ORDER — ACETAMINOPHEN 325 MG PO TABS: 650.0000 mg | ORAL_TABLET | Freq: Four times a day (QID) | ORAL | Status: DC | PRN

## 2018-08-19 MED ORDER — IPRATROPIUM BROMIDE 0.02 % IN SOLN
0.5000 mg | Freq: Four times a day (QID) | RESPIRATORY_TRACT | Status: DC
  Administered 2018-08-20: 0.5 mg via RESPIRATORY_TRACT
  Filled 2018-08-19: qty 2.5

## 2018-08-19 MED ORDER — HEPARIN SODIUM (PORCINE) 1000 UNIT/ML DIALYSIS: 1000.0000 [IU] | INTRAMUSCULAR | Status: DC | PRN

## 2018-08-19 MED ORDER — CINACALCET HCL 30 MG PO TABS: 90.0000 mg | ORAL_TABLET | Freq: Every day | ORAL | Status: DC

## 2018-08-19 MED ORDER — ALBUMIN HUMAN 25 % IV SOLN
INTRAVENOUS | Status: AC
  Administered 2018-08-19: 25 g via INTRAVENOUS
  Filled 2018-08-19: qty 100

## 2018-08-19 MED ORDER — ICOSAPENT ETHYL 1 G PO CAPS: 1.0000 | ORAL_CAPSULE | Freq: Two times a day (BID) | ORAL | Status: DC

## 2018-08-19 MED ORDER — ACETAMINOPHEN 650 MG RE SUPP: 650.0000 mg | Freq: Four times a day (QID) | RECTAL | Status: DC | PRN

## 2018-08-19 MED ORDER — NITROGLYCERIN IN D5W 200-5 MCG/ML-% IV SOLN
0.0000 ug/min | Freq: Once | INTRAVENOUS | Status: AC
  Administered 2018-08-19: 5 ug/min via INTRAVENOUS
  Filled 2018-08-19: qty 250

## 2018-08-19 MED ORDER — GABAPENTIN 300 MG PO CAPS: 300.0000 mg | ORAL_CAPSULE | Freq: Every day | ORAL | Status: DC

## 2018-08-19 MED ORDER — NITROGLYCERIN 0.4 MG SL SUBL: 0.4000 mg | SUBLINGUAL_TABLET | SUBLINGUAL | Status: DC | PRN

## 2018-08-19 MED ORDER — FERRIC CITRATE 1 GM 210 MG(FE) PO TABS
420.0000 mg | ORAL_TABLET | Freq: Three times a day (TID) | ORAL | Status: DC
  Administered 2018-08-20: 420 mg via ORAL
  Filled 2018-08-19 (×4): qty 2

## 2018-08-19 MED ORDER — OMEGA-3-ACID ETHYL ESTERS 1 G PO CAPS
1.0000 g | ORAL_CAPSULE | Freq: Two times a day (BID) | ORAL | Status: DC
  Administered 2018-08-20: 1 g via ORAL
  Filled 2018-08-19: qty 1

## 2018-08-19 MED ORDER — ALBUTEROL SULFATE (2.5 MG/3ML) 0.083% IN NEBU: 2.5000 mg | INHALATION_SOLUTION | RESPIRATORY_TRACT | Status: DC | PRN

## 2018-08-19 MED ORDER — ALTEPLASE 2 MG IJ SOLR: 2.0000 mg | Freq: Once | INTRAMUSCULAR | Status: DC | PRN

## 2018-08-19 MED ORDER — CHLORHEXIDINE GLUCONATE CLOTH 2 % EX PADS
6.0000 | MEDICATED_PAD | Freq: Every day | CUTANEOUS | Status: DC
  Administered 2018-08-20: 6 via TOPICAL

## 2018-08-19 MED ORDER — ATORVASTATIN CALCIUM 40 MG PO TABS: 40.0000 mg | ORAL_TABLET | Freq: Every day | ORAL | Status: DC

## 2018-08-19 MED ORDER — AMLODIPINE BESYLATE 10 MG PO TABS: 10.0000 mg | ORAL_TABLET | Freq: Every day | ORAL | Status: DC

## 2018-08-19 MED ORDER — HEPARIN SODIUM (PORCINE) 5000 UNIT/ML IJ SOLN
5000.0000 [IU] | Freq: Three times a day (TID) | INTRAMUSCULAR | Status: DC
  Administered 2018-08-20: 5000 [IU] via SUBCUTANEOUS
  Filled 2018-08-19: qty 1

## 2018-08-19 MED ORDER — HYDRALAZINE HCL 20 MG/ML IJ SOLN
5.0000 mg | INTRAMUSCULAR | Status: DC | PRN
  Filled 2018-08-19: qty 1

## 2018-08-19 MED ORDER — DM-GUAIFENESIN ER 30-600 MG PO TB12: 1.0000 | ORAL_TABLET | Freq: Two times a day (BID) | ORAL | Status: DC | PRN

## 2018-08-19 MED ORDER — GLYCOPYRROLATE 25 MCG/ML IN SOLN: 25.0000 ug | Freq: Two times a day (BID) | RESPIRATORY_TRACT | Status: DC

## 2018-08-19 MED ORDER — PENTAFLUOROPROP-TETRAFLUOROETH EX AERO: 1.0000 "application " | INHALATION_SPRAY | CUTANEOUS | Status: DC | PRN

## 2018-08-19 MED ORDER — ZOLPIDEM TARTRATE 5 MG PO TABS: 5.0000 mg | ORAL_TABLET | Freq: Every evening | ORAL | Status: DC | PRN

## 2018-08-19 MED ORDER — ISOSORBIDE MONONITRATE ER 30 MG PO TB24
30.0000 mg | ORAL_TABLET | Freq: Every day | ORAL | Status: DC
  Administered 2018-08-20: 30 mg via ORAL
  Filled 2018-08-19: qty 1

## 2018-08-19 MED ORDER — METOPROLOL TARTRATE 25 MG PO TABS
25.0000 mg | ORAL_TABLET | Freq: Two times a day (BID) | ORAL | Status: DC
  Administered 2018-08-20: 25 mg via ORAL
  Filled 2018-08-19: qty 1

## 2018-08-19 MED ORDER — HYDRALAZINE HCL 50 MG PO TABS
50.0000 mg | ORAL_TABLET | Freq: Three times a day (TID) | ORAL | Status: DC
  Administered 2018-08-20: 50 mg via ORAL
  Filled 2018-08-19: qty 1

## 2018-08-19 MED ORDER — ASPIRIN 81 MG PO CHEW
81.0000 mg | CHEWABLE_TABLET | Freq: Every day | ORAL | Status: DC
  Administered 2018-08-20: 81 mg via ORAL
  Filled 2018-08-19: qty 1

## 2018-08-19 MED ORDER — NICOTINE 21 MG/24HR TD PT24
21.0000 mg | MEDICATED_PATCH | Freq: Every day | TRANSDERMAL | Status: DC
  Administered 2018-08-20: 21 mg via TRANSDERMAL
  Filled 2018-08-19: qty 1

## 2018-08-19 MED ORDER — SODIUM CHLORIDE 0.9 % IV SOLN: 100.0000 mL | INTRAVENOUS | Status: DC | PRN

## 2018-08-19 MED ORDER — CHLORHEXIDINE GLUCONATE CLOTH 2 % EX PADS: 6.0000 | MEDICATED_PAD | Freq: Every day | CUTANEOUS | Status: DC

## 2018-08-19 MED ORDER — HYDROXYZINE HCL 10 MG PO TABS
10.0000 mg | ORAL_TABLET | Freq: Three times a day (TID) | ORAL | Status: DC | PRN
  Filled 2018-08-19: qty 1

## 2018-08-19 MED ORDER — HYDROCODONE-ACETAMINOPHEN 5-325 MG PO TABS: 1.0000 | ORAL_TABLET | Freq: Four times a day (QID) | ORAL | Status: DC | PRN

## 2018-08-19 MED ORDER — DOXERCALCIFEROL 4 MCG/2ML IV SOLN: 2.0000 ug | INTRAVENOUS | Status: DC

## 2018-08-19 NOTE — Progress Notes (Signed)
Changed patients XL BIPAP mask to Medium for better fit before transporting patient to dialysis on BIPAP.  No events to report.

## 2018-08-19 NOTE — H&P (Addendum)
History and Physical    Trevor [name] VOZ:[id number] DOB: 11/17/1946 DOA: 08/19/2018  Referring MD/NP/PA:   PCP: [name]   Patient coming from:  The patient is coming from [name].  At baseline, pt is dependent for most of ADL.   Chief Complaint: Respiratory distress, chest pain, elevated blood pressure  HPI: Trevor [name] is a 72 y.o. male with medical history significant of hypertension, hyperlipidemia, stroke, ESRD-HD (MWF), status post fistula revision on 08/17/2018, AAA, blindness, CHF with EF of 45%, CAD, tobacco abuse, cocaine abuse, who presents with respiratory distress and chest pain.  Per report, pt developed sudden onset of acute respiratory distress and shortness of breath in facility today.  He also had chest pain.  Patient was found to have elevated blood pressure, 210/110 and oxygen desaturation to 72% on room air. Patient's blood pressure improved to 155/74 after treated with nitroglycerin drip in ED. He has cough with white mucus production, no fever or chills. Patient states that initially he had severe chest pain, which is located in the substernal area, constant, pressure-like, nonradiating.  Currently his chest pain has improved along with improvement of blood pressure control.  Patient has nausea, but no vomiting, diarrhea or abdominal pain.  No symptoms of UTI or unilateral weakness. Patient hemodialyzes Monday Wednesday and Friday.  His last dialysis session was yesterday but he states he did not complete it and is unable to tell why.  ED Course: pt was found to have WBC 7.7, potassium 4.1, bicarbonate 23, creatinine 7.80, BUN 32, currently blood pressure 155/74, heart rate 80s, tachypnea, BiPAP started, chest x-ray showed interstitial pulmonary edema.  Patient is placed on stepdown bed for observation.  Nephrology, Dr. Moshe Cipro was consulted for urgent dialysis.  Review of Systems:   General: no fevers, chills, no body weight gain,has fatigue HEENT:  no blurry vision, hearing changes or sore throat Respiratory: has dyspnea, coughing, no wheezing CV: has chest pain, no palpitations GI: has nausea, no vomiting, abdominal pain, diarrhea, constipation GU: no dysuria, burning on urination, increased urinary frequency, hematuria  Ext: has trace leg edema Neuro: no unilateral weakness, numbness, or tingling, no vision change or hearing loss Skin: no rash, no skin tear. MSK: No muscle spasm, no deformity, no limitation of range of movement in spin Heme: No easy bruising.  Travel history: No recent long distant travel.  Allergy: No Known Allergies  Past Medical History:  Diagnosis Date  . AAA (abdominal aortic aneurysm) ([place])   . Anemia   . Anxiety   . Arthritis    arms and back  . Blindness   . CHF (congestive heart failure) ([place])   . Coronary artery disease    mild   . 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 ESRD on hemodialysis ([place])    started HD 2014-15  . Headache(784.0)   . HTN (hypertension)   . Protein-calorie malnutrition ([place])   . Stroke [facility]) 01/2018   no residual deficits    Past Surgical History:  Procedure Laterality Date  . A/V FISTULAGRAM N/A 07/29/2018   Procedure: A/V FISTULAGRAM - left upper extremity;  Surgeon: Waynetta Sandy, MD;  Location: [place] [facility];  Service: Cardiovascular;  Laterality: N/A;  . AXILLARY-FEMORAL BYPASS GRAFT  06/28/2017   Procedure: BYPASS GRAFT RIGHT AXILLA-BIFEMORAL USING 8MM X 30CM AND 8MM X 60CM HEMASHIELD GOLD GRAFTS;  Surgeon: Rosetta Posner, MD;  Location: [place];  Service: Vascular;;  . CARDIAC CATHETERIZATION N/A 11/01/2016   Procedure: Left Heart Cath and Coronary Angiography;  Surgeon: Lorretta Harp, MD;  Location: [place] [facility];  Service: Cardiovascular;  Laterality: N/A;  . ENDARTERECTOMY FEMORAL Bilateral 06/28/2017   Procedure: ENDARTERECTOMY BILATERAL FEMORAL ARTERIES;  Surgeon: Rosetta Posner, MD;   Location: [place];  Service: Vascular;  Laterality: Bilateral;  . FISTULA SUPERFICIALIZATION Left 02/21/5572   Procedure: PLICATION OF ARTERIOVENOUS FISTULA LEFT ARM;  Surgeon: Waynetta Sandy, MD;  Location: [place];  Service: Vascular;  Laterality: Left;  . FISTULOGRAM Left 05/20/2018   Procedure: FISTULOGRAM WITH BALLOON ANGIOPLASTY LEFT ARM ARTERIOVENOUS FISTULA;  Surgeon: Waynetta Sandy, MD;  Location: [place];  Service: Vascular;  Laterality: Left;  . INSERTION OF DIALYSIS CATHETER  09/21/2012   Procedure: INSERTION OF DIALYSIS CATHETER;  Surgeon: Angelia Mould, MD;  Location: [place];  Service: Vascular;  Laterality: N/A;  Right Internal Jugular Placement  . LEFT HEART CATHETERIZATION WITH CORONARY ANGIOGRAM N/A 09/28/2012   Procedure: LEFT HEART CATHETERIZATION WITH CORONARY ANGIOGRAM;  Surgeon: Sherren Mocha, MD;  Location: [facility] CATH LAB;  Service: Cardiovascular;  Laterality: N/A;  . LEFT HEART CATHETERIZATION WITH CORONARY ANGIOGRAM N/A 07/07/2014   Procedure: LEFT HEART CATHETERIZATION WITH CORONARY ANGIOGRAM;  Surgeon: Leonie Man, MD;  Location: [facility] CATH LAB;  Service: Cardiovascular;  Laterality: N/A;  . PERIPHERAL VASCULAR BALLOON ANGIOPLASTY Left 07/29/2018   Procedure: PERIPHERAL VASCULAR BALLOON ANGIOPLASTY;  Surgeon: Waynetta Sandy, MD;  Location: [place] [facility];  Service: Cardiovascular;  Laterality: Left;  CENTRAL VEIN  . PERIPHERAL VASCULAR INTERVENTION Left 07/29/2018   Procedure: PERIPHERAL VASCULAR INTERVENTION;  Surgeon: Waynetta Sandy, MD;  Location: [place] [facility];  Service: Cardiovascular;  Laterality: Left;  AXILLARY VEIN  . REVISON OF ARTERIOVENOUS FISTULA Left 08/17/2018   Procedure: REVISION OF ARTERIOVENOUS FISTULA LEFT ARM;  Surgeon: Waynetta Sandy, MD;  Location: [place];  Service: Vascular;  Laterality: Left;  . right hand    . TEE WITHOUT CARDIOVERSION  10/09/2011   Procedure: TRANSESOPHAGEAL  ECHOCARDIOGRAM (TEE);  Surgeon: Lelon Perla, MD;  Location: [facility] ENDOSCOPY;  Service: Cardiovascular;  Laterality: N/A;    Social History:  reports that he has been smoking cigarettes. He has a 30.00 pack-year smoking history. He has never used smokeless tobacco. He reports that he does not drink alcohol or use drugs.  Family History:  Family History  Problem Relation Age of Onset  . Heart attack Mother        MI in her 28s  . Diabetes Mother   . Alcohol abuse Father   . Anesthesia problems Neg Hx   . Hypotension Neg Hx   . Malignant hyperthermia Neg Hx   . Pseudochol deficiency Neg Hx      Prior to Admission medications   Medication Sig Start Date End Date Taking? Authorizing Provider  acetaminophen (TYLENOL) 325 MG tablet Take 650 mg by mouth every 6 (six) hours as needed.    [provider]  amLODipine (NORVASC) 10 MG tablet Take 10 mg by mouth at bedtime.  04/13/17   [provider]  aspirin 81 MG chewable tablet Chew 1 tablet (81 mg total) by mouth daily. Patient not taking: Reported on 08/11/2018 11/04/16   Consuelo Pandy, PA-C  aspirin EC 81 MG tablet Take 81 mg by mouth daily.    [provider]  atorvastatin (LIPITOR) 40 MG tablet Take 40 mg by mouth at bedtime.  [provider]  bisacodyl (DULCOLAX) 10 MG suppository Place 10 mg rectally daily as needed (constipation).     [provider]  cinacalcet (SENSIPAR) 90 MG tablet Take 90 mg by mouth daily with supper.    [provider]  clopidogrel (PLAVIX) 75 MG tablet Take 1 tablet (75 mg total) by mouth daily. 04/05/15   Barton Dubois, MD  docusate sodium (COLACE) 100 MG capsule Take 200 mg by mouth at bedtime.     [provider]  doxercalciferol (HECTOROL) 4 MCG/2ML injection Inject 1 mL (2 mcg total) into the vein every Monday, Wednesday, and Friday with hemodialysis. 01/15/18   Colbert Ewing, MD  ferric citrate (AURYXIA) 1 GM 210 MG(Fe) tablet Take 630  mg by mouth 3 (three) times daily with meals.     [provider]  gabapentin (NEURONTIN) 300 MG capsule Take 1 capsule (300 mg total) by mouth at bedtime. 01/14/18   Colbert Ewing, MD  Glycopyrrolate [facility] REFILL KIT) 25 MCG/ML SOLN Inhale 25 mcg into the lungs 2 (two) times daily.    [provider]  hydrALAZINE (APRESOLINE) 50 MG tablet Take 50 mg by mouth 3 (three) times daily.    [provider]  HYDROcodone-acetaminophen (NORCO) 5-325 MG tablet Take 1 tablet by mouth every 6 (six) hours as needed for moderate pain. 08/17/18   Dagoberto Ligas, PA-C  ipratropium-albuterol (DUONEB) 0.5-2.5 (3) MG/3ML SOLN Take 3 mLs by nebulization every 8 (eight) hours.    [provider]  isosorbide mononitrate (IMDUR) 30 MG 24 hr tablet Take 30 mg by mouth daily.    [provider]  metoprolol tartrate (LOPRESSOR) 25 MG tablet Take 1 tablet (25 mg total) by mouth 2 (two) times daily. 11/03/16   Lyda Jester M, PA-C  Nutritional Supplements (FEEDING SUPPLEMENT, NEPRO CARB STEADY,) LIQD Take 237 mLs by mouth 2 (two) times daily between meals. 12/11/16   Geradine Girt, DO  omega-3 acid ethyl esters (LOVAZA) 1 g capsule Take 1 g by mouth 2 (two) times daily.    [provider]  ondansetron (ZOFRAN) 4 MG tablet Take 4 mg by mouth every 6 (six) hours as needed for nausea or vomiting.    [provider]  senna (SENOKOT) 8.6 MG TABS tablet Take 1 tablet by mouth daily.    [provider]    Physical Exam: Vitals:   08/19/18 1845 08/19/18 1900 08/19/18 1915 08/19/18 1930  BP: (!) 168/76 (!) 143/72 (!) 155/74 (!) 164/73  Pulse:  93 87 88  Resp: (!) 21 20 (!) 23 17  SpO2:  94% 96% 98%   General: Not in acute distress HEENT:       Eyes: right eye blindness and poor vision in left eye. No scleral icterus.       ENT: No discharge from the ears and nose, no pharynx injection, no tonsillar enlargement.        Neck: positive JVD,  no bruit, no mass felt. Heme: No neck lymph node enlargement. Cardiac: S1/S2, RRR, No murmurs, No gallops or rubs. Respiratory:  No rales, wheezing, rhonchi or rubs. GI: Soft, nondistended, nontender, no rebound pain, no organomegaly, BS present. GU: No hematuria Ext: trace leg edema bilaterally. 2+DP/PT pulse bilaterally. Musculoskeletal: No joint deformities, No joint redness or warmth, no limitation of ROM in spin. Skin: No rashes.  Neuro: Alert, oriented X3, cranial nerves II-XII grossly intact, moves all extremities normally.  Psych: Patient is not psychotic, no suicidal or hemocidal ideation.  Labs on Admission: I have personally reviewed following labs and imaging studies  CBC: Recent Labs  Lab 08/17/18 1056 08/19/18 1755 08/19/18 1804  WBC  --  7.7  --   NEUTROABS  --  4.9  --   HGB 9.2* 10.1* 10.9*  HCT 27.0* 31.8* 32.0*  MCV  --  91.4  --   PLT  --  189  --    Basic Metabolic Panel: Recent Labs  Lab 08/17/18 1056 08/19/18 1755 08/19/18 1804  NA 138 137 136  K 3.9 4.1 4.1  CL  --  96* 99  CO2  --  23  --   GLUCOSE 76 191* 190*  BUN  --  29* 32*  CREATININE  --  7.30* 7.80*  CALCIUM  --  9.7  --    GFR: Estimated Creatinine Clearance: 7.2 mL/min (A) (by C-G formula based on SCr of 7.8 mg/dL (H)). Liver Function Tests: No results for input(s): AST, ALT, ALKPHOS, BILITOT, PROT, ALBUMIN in the last 168 hours. No results for input(s): LIPASE, AMYLASE in the last 168 hours. No results for input(s): AMMONIA in the last 168 hours. Coagulation Profile: No results for input(s): INR, PROTIME in the last 168 hours. Cardiac Enzymes: No results for input(s): CKTOTAL, CKMB, CKMBINDEX, TROPONINI in the last 168 hours. BNP (last 3 results) No results for input(s): PROBNP in the last 8760 hours. HbA1C: No results for input(s): HGBA1C in the last 72 hours. CBG: No results for input(s): GLUCAP in the last 168 hours. Lipid Profile: No results for input(s): CHOL, HDL,  LDLCALC, TRIG, CHOLHDL, LDLDIRECT in the last 72 hours. Thyroid Function Tests: No results for input(s): TSH, T4TOTAL, FREET4, T3FREE, THYROIDAB in the last 72 hours. Anemia Panel: No results for input(s): VITAMINB12, FOLATE, FERRITIN, TIBC, IRON, RETICCTPCT in the last 72 hours. Urine analysis:    Component Value Date/Time   COLORURINE YELLOW 04/04/2017 1833   APPEARANCEUR CLEAR 04/04/2017 1833   LABSPEC 1.008 04/04/2017 1833   PHURINE 9.0 (H) 04/04/2017 1833   GLUCOSEU 50 (A) 04/04/2017 1833   HGBUR MODERATE (A) 04/04/2017 1833   BILIRUBINUR NEGATIVE 04/04/2017 1833   [place] 04/04/2017 1833   PROTEINUR 100 (A) 04/04/2017 1833   UROBILINOGEN 0.2 01/08/2015 2116   NITRITE NEGATIVE 04/04/2017 1833   LEUKOCYTESUR NEGATIVE 04/04/2017 1833   Sepsis Labs: '@LABRCNTIP'$ (procalcitonin:4,lacticidven:4) )No results found for this or any previous visit (from the past 240 hour(s)).   Radiological Exams on Admission: Dg Chest Port 1 View  Result Date: 08/19/2018 CLINICAL DATA:  Shortness of breath EXAM: PORTABLE CHEST 1 VIEW COMPARISON:  04/19/2018, 03/29/2018, 01/11/2018, 12/23/2017 FINDINGS: Stable cardiomediastinal silhouette. Worsened diffuse reticular and interstitial opacity since prior radiograph. Aortic atherosclerosis. No pneumothorax. IMPRESSION: Diffusely increased interstitial and reticular opacity which may reflect interstitial edema or inflammatory process; findings have slightly worsened since 04/19/2018. Electronically Signed   By: Donavan Foil M.D.   On: 08/19/2018 18:58     EKG: Independently reviewed.  Sinus rhythm, QTC 516, LVH, ST depression in V4-V6 and in lateral leads   Assessment/Plan Principal Problem:   Acute respiratory failure with hypoxia (HCC) Active Problems:   Tobacco use disorder   History of CVA (cerebrovascular accident)   Acute on chronic combined systolic and diastolic CHF (congestive heart failure) (HCC)   Coronary artery disease,  non-occlusive:    End-stage renal disease on hemodialysis (HCC)   Anemia of renal disease   Chest pain   Dyslipidemia   Essential hypertension   Hypertensive urgency  Acute pulmonary edema (HCC)  Acute respiratory failure with hypoxia due to acute flash of pulmonary edema secondary to hypertensive urgency: Blood pressure 210/110, which improved to 155/74 after treated with nitroglycerin drip in ED.  Respiratory distress improved with BiPAP.  Chest x-ray showed interstitial pulmonary edema.  Nephrology, Dr. Moshe Cipro was consulted for urgent dialysis.  -will place on SDU for obs - BipAP -Bp control as below -Hemodialysis per renal - Atrovent nebs and prn albuterol nebs  Hx of Essential hypertension and hypertensive urgency: -Continue home medications: Amlodipine, hydralazine, metoprolol -IV hydralazine as needed -Nitroglycerin drip as needed  Chest pain and hx of  CAD: Most likely due to demand ischemia secondary to hypertensive urgency -PRN nitroglycerin and morphine -Aspirin, Lipitor, Imdur, metoprolol -Follow-up troponin x3 -Repeat EKG morning  Tobacco use disorder: -Nicotine patch  History of CVA (cerebrovascular accident): -ASA, Plavix, Lipitor  Acute on chronic combined systolic and diastolic CHF (congestive heart failure) [facility]): Patient has a pulmonary edema.  2D echo on 01/12/2018 showed EF of 45-50% with grade 1 diastolic dysfunction. -Urgent dialysis per renal  End-stage renal disease on hemodialysis [facility]): potassium 4.1, bicarbonate 23, creatinine 7.80, BUN 32, -HD per renal  Anemia of renal disease: Hemoglobin stable, 10.9. -Continue iron supplement  Dyslipidemia: -lipitor   DVT ppx: SQ Heparin     Code Status: Full code Family Communication: None at bed side.  Disposition Plan:  Anticipate discharge back to previous [name] Consults called:  Renal, Dr. Moshe Cipro Admission status: SDU/obs  Date of Service 08/19/2018    Ivor Costa [facility] Pager [phone]  If 7PM-7AM, please contact night-coverage [email or web address] Password [email or web address] 08/19/2018, 8:05 PM

## 2018-08-19 NOTE — Consult Note (Signed)
Reason for Consult: To manage dialysis and dialysis related needs Referring Physician: Eliam Snapp is an 72 y.o. 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  Currently on dialysis at the [facility] kidney center on Monday, Wednesday, and Friday.  Patient seems to have a history of coming into the hospital in respiratory distress with pulmonary edema.  He did this in December 2018, February, April, and May.  However, had not been back again until tonight.  He appears to be compliant with his dialysis.  He tells me on Wednesday that the water went out so his treatment had to be ended early.  He is only noted to have 2 hours of dialysis on Wednesday but only left out 0.9 over his dry weight.  It has been looking like dry weight needed to be decreased and he does acknowledge that he possibly has lost weight.  He came into the ER tonight in respiratory distress.  He has required BiPAP and a nitroglycerin drip but is more comfortable.  Chest x-ray showing pulmonary edema   [facility]yzes at [facility] 58.5-but has left out as much as 0.5 under his dry weight with a high blood pressure.  4 hours, profile #4 HD Bath 2K/2 calcium, Dialyzer 180, Heparin does not appear to get. Access AV fistula-recent fistulogram on 8/29.  Meds include Hectorol 3 and Mircera 75 last given on 8/30.  Most recent hemoglobin 9.0, PTH 713  Past Medical History:  Diagnosis Date  . AAA (abdominal aortic aneurysm) ([place])   . Anemia   . Anxiety   . Arthritis    arms and back  . Blindness   . CHF (congestive heart failure) ([place])   . Coronary artery disease    mild   . 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 ESRD on hemodialysis ([place])    started HD 2014-15  . Headache(784.0)   . HTN (hypertension)   . Protein-calorie malnutrition ([place])   . Stroke  [facility]) 01/2018   no residual deficits    Past Surgical History:  Procedure Laterality Date  . A/V FISTULAGRAM N/A 07/29/2018   Procedure: A/V FISTULAGRAM - left upper extremity;  Surgeon: Waynetta Sandy, MD;  Location: [place] [facility];  Service: Cardiovascular;  Laterality: N/A;  . AXILLARY-FEMORAL BYPASS GRAFT  06/28/2017   Procedure: BYPASS GRAFT RIGHT AXILLA-BIFEMORAL USING 8MM X 30CM AND 8MM X 60CM HEMASHIELD GOLD GRAFTS;  Surgeon: Rosetta Posner, MD;  Location: [place];  Service: Vascular;;  . CARDIAC CATHETERIZATION N/A 11/01/2016   Procedure: Left Heart Cath and Coronary Angiography;  Surgeon: Lorretta Harp, MD;  Location: [place] [facility];  Service: Cardiovascular;  Laterality: N/A;  . ENDARTERECTOMY FEMORAL Bilateral 06/28/2017   Procedure: ENDARTERECTOMY BILATERAL FEMORAL ARTERIES;  Surgeon: Rosetta Posner, MD;  Location: [place];  Service: Vascular;  Laterality: Bilateral;  . FISTULA SUPERFICIALIZATION Left 6/59/9357   Procedure: PLICATION OF ARTERIOVENOUS FISTULA LEFT ARM;  Surgeon: Waynetta Sandy, MD;  Location: [place];  Service: Vascular;  Laterality: Left;  . FISTULOGRAM Left 05/20/2018   Procedure: FISTULOGRAM WITH BALLOON ANGIOPLASTY LEFT ARM ARTERIOVENOUS FISTULA;  Surgeon: Waynetta Sandy, MD;  Location: [place];  Service: Vascular;  Laterality: Left;  . INSERTION OF DIALYSIS CATHETER  09/21/2012   Procedure: INSERTION OF DIALYSIS CATHETER;  Surgeon: Judeth Cornfield  Scot Dock, MD;  Location: [place];  Service: Vascular;  Laterality: N/A;  Right Internal Jugular Placement  . LEFT HEART CATHETERIZATION WITH CORONARY ANGIOGRAM N/A 09/28/2012   Procedure: LEFT HEART CATHETERIZATION WITH CORONARY ANGIOGRAM;  Surgeon: Sherren Mocha, MD;  Location: [facility] CATH LAB;  Service: Cardiovascular;  Laterality: N/A;  . LEFT HEART CATHETERIZATION WITH CORONARY ANGIOGRAM N/A 07/07/2014   Procedure: LEFT HEART CATHETERIZATION WITH CORONARY ANGIOGRAM;  Surgeon: Leonie Man,  MD;  Location: [facility] CATH LAB;  Service: Cardiovascular;  Laterality: N/A;  . PERIPHERAL VASCULAR BALLOON ANGIOPLASTY Left 07/29/2018   Procedure: PERIPHERAL VASCULAR BALLOON ANGIOPLASTY;  Surgeon: Waynetta Sandy, MD;  Location: [place] [facility];  Service: Cardiovascular;  Laterality: Left;  CENTRAL VEIN  . PERIPHERAL VASCULAR INTERVENTION Left 07/29/2018   Procedure: PERIPHERAL VASCULAR INTERVENTION;  Surgeon: Waynetta Sandy, MD;  Location: [place] [facility];  Service: Cardiovascular;  Laterality: Left;  AXILLARY VEIN  . REVISON OF ARTERIOVENOUS FISTULA Left 08/17/2018   Procedure: REVISION OF ARTERIOVENOUS FISTULA LEFT ARM;  Surgeon: Waynetta Sandy, MD;  Location: [place];  Service: Vascular;  Laterality: Left;  . right hand    . TEE WITHOUT CARDIOVERSION  10/09/2011   Procedure: TRANSESOPHAGEAL ECHOCARDIOGRAM (TEE);  Surgeon: Lelon Perla, MD;  Location: [facility] ENDOSCOPY;  Service: Cardiovascular;  Laterality: N/A;    Family History  Problem Relation Age of Onset  . Heart attack Mother        MI in her 75s  . Diabetes Mother   . Alcohol abuse Father   . Anesthesia problems Neg Hx   . Hypotension Neg Hx   . Malignant hyperthermia Neg Hx   . Pseudochol deficiency Neg Hx     Social History:  reports that he has been smoking cigarettes. He has a 30.00 pack-year smoking history. He has never used smokeless tobacco. He reports that he does not drink alcohol or use drugs.  Allergies: No Known Allergies  Medications: I have reviewed the patient's current medications. Hectorol 3 mcg q HD Mircera 75- last given 8/30  Results for orders placed or performed during the hospital encounter of 08/19/18 (from the past 48 hour(s))  Basic metabolic panel     Status: Abnormal   Collection Time: 08/19/18  5:55 PM  Result Value Ref Range   Sodium 137 135 - 145 mmol/L   Potassium 4.1 3.5 - 5.1 mmol/L   Chloride 96 (L) 98 - 111 mmol/L   CO2 23 22 - 32 mmol/L   Glucose,  Bld 191 (H) 70 - 99 mg/dL   BUN 29 (H) 8 - 23 mg/dL   Creatinine, Ser 7.30 (H) 0.61 - 1.24 mg/dL   Calcium 9.7 8.9 - 10.3 mg/dL   GFR calc non Af Amer 7 (L) >60 mL/min   GFR calc Af Amer 8 (L) >60 mL/min    Comment: (NOTE) The eGFR has been calculated using the CKD EPI equation. This calculation has not been validated in all clinical situations. eGFR's persistently <60 mL/min signify possible Chronic Kidney Disease.    Anion gap 18 (H) 5 - 15    Comment: Performed at [facility], [place] [address] [place], [place] [address]  CBC with Differential     Status: Abnormal   Collection Time: 08/19/18  5:55 PM  Result Value Ref Range   WBC 7.7 4.0 - 10.5 K/uL   RBC 3.48 (L) 4.22 - 5.81 MIL/uL   Hemoglobin 10.1 (L) 13.0 - 17.0 g/dL   HCT 31.8 (  L) 39.0 - 52.0 %   MCV 91.4 78.0 - 100.0 fL   MCH 29.0 26.0 - 34.0 pg   MCHC 31.8 30.0 - 36.0 g/dL   RDW 14.2 11.5 - 15.5 %   Platelets 189 150 - 400 K/uL   Neutrophils Relative % 64 %   Neutro Abs 4.9 1.7 - 7.7 K/uL   Lymphocytes Relative 22 %   Lymphs Abs 1.7 0.7 - 4.0 K/uL   Monocytes Relative 8 %   Monocytes Absolute 0.6 0.1 - 1.0 K/uL   Eosinophils Relative 4 %   Eosinophils Absolute 0.3 0.0 - 0.7 K/uL   Basophils Relative 1 %   Basophils Absolute 0.1 0.0 - 0.1 K/uL   Immature Granulocytes 1 %   Abs Immature Granulocytes 0.0 0.0 - 0.1 K/uL    Comment: Performed at [facility] [address], [place], [place] [address]  I-stat troponin, ED     Status: None   Collection Time: 08/19/18  6:01 PM  Result Value Ref Range   Troponin i, poc 0.06 0.00 - 0.08 ng/mL   Comment 3            Comment: Due to the release kinetics of cTnI, a negative result within the first hours of the onset of symptoms does not rule out myocardial infarction with certainty. If myocardial infarction is still suspected, repeat the test at appropriate intervals.   I-stat Chem 8, ED     Status: Abnormal   Collection Time: 08/19/18  6:04 PM  Result  Value Ref Range   Sodium 136 135 - 145 mmol/L   Potassium 4.1 3.5 - 5.1 mmol/L   Chloride 99 98 - 111 mmol/L   BUN 32 (H) 8 - 23 mg/dL   Creatinine, Ser 7.80 (H) 0.61 - 1.24 mg/dL   Glucose, Bld 190 (H) 70 - 99 mg/dL   Calcium, Ion 1.10 (L) 1.15 - 1.40 mmol/L   TCO2 25 22 - 32 mmol/L   Hemoglobin 10.9 (L) 13.0 - 17.0 g/dL   HCT 32.0 (L) 39.0 - 52.0 %  I-Stat venous blood gas, ED     Status: None   Collection Time: 08/19/18  6:07 PM  Result Value Ref Range   pH, Ven 7.367 7.250 - 7.430   pCO2, Ven 45.0 44.0 - 60.0 mmHg   pO2, Ven 38.0 32.0 - 45.0 mmHg   Bicarbonate 25.8 20.0 - 28.0 mmol/L   TCO2 27 22 - 32 mmol/L   O2 Saturation 70.0 %   Patient temperature HIDE    Sample type VENOUS    Comment NOTIFIED PHYSICIAN     Dg Chest Port 1 View  Result Date: 08/19/2018 CLINICAL DATA:  Shortness of breath EXAM: PORTABLE CHEST 1 VIEW COMPARISON:  04/19/2018, 03/29/2018, 01/11/2018, 12/23/2017 FINDINGS: Stable cardiomediastinal silhouette. Worsened diffuse reticular and interstitial opacity since prior radiograph. Aortic atherosclerosis. No pneumothorax. IMPRESSION: Diffusely increased interstitial and reticular opacity which may reflect interstitial edema or inflammatory process; findings have slightly worsened since 04/19/2018. Electronically Signed   By: Donavan Foil M.D.   On: 08/19/2018 18:58    ROS: SOB, maybe some swelling the other day  Blood pressure (!) 164/73, pulse 88, resp. rate 17, SpO2 98 %. General appearance: alert and moderate distress Resp: diminished breath sounds bibasilar Cardio: regular rate and rhythm, S1, S2 normal, no murmur, click, rub or gallop GI: soft, non-tender; bowel sounds normal; no masses,  no organomegaly Extremities: extremities normal, atraumatic, no cyanosis or edema AVF wth thrill  and bruit   Assessment/Plan: 1 respiratory distress-chest x-ray consistent with pulmonary edema.  Patient has had this clinical scenario many times in the past.  Needs  emergent dialysis tonight with volume removal.  I anticipate his need for BiPAP and nitroglycerin will decrease with dialysis 2 ESRD: Normally Monday Wednesday Friday.  Had only abbreviated treatment on Wednesday.  I am afraid he will likely need it on Friday as well so will write orders.  However, argument could be made for Saturday, then Monday treatment possibly as an outpatient- will defer to Friday team  3 Hypertension: Due to volume overload and anxiety with shortness of breath.  Has decreased with treatment 4. Anemia of ESRD: Hemoglobin here 10.  Will not treat with ESA at this time.  Should be due at the outpatient center 5. Metabolic Bone Disease: We will continue his home Hectorol ,  Auryxia and Sensipar 6. Dispo-possible discharge soon if symptoms resolve with dialysis  Justus Droke A 08/19/2018, 7:41 PM

## 2018-08-19 NOTE — ED Provider Notes (Signed)
[facility] EMERGENCY DEPARTMENT Provider Note   CSN: [id number] Arrival date & time: 08/19/18  1746     History   Chief Complaint Chief Complaint  Patient presents with  . Respiratory Distress  . Chest Pain    HPI Trevor [name] is a 72 y.o. male who  has a past medical history of AAA (abdominal aortic aneurysm) ([place]), Anemia, Anxiety, Arthritis, Blindness, CHF (congestive heart failure) ([place]), Coronary artery disease, CVA (cerebral infarction), ESRD on hemodialysis ([place]), Headache(784.0), HTN (hypertension), Protein-calorie malnutrition ([place]), and Stroke ([place]) (01/2018).  He is status post fistula revision on 08/17/2018 Patient presents via EMS in acute respiratory distress.  History is gathered by review of EMR and from both the EMS and patient.  According to EMS patient had acute onset respiratory distress.  They were called to his skilled nursing facility.  They found him in extremis hypertensive with a systolic pressure of 329 little and oxygen saturations in the 70s.  Patient hemodialyzes Monday Wednesday and Friday.  His last dialysis session was yesterday but he states he did not complete it and is unable to tell me why.  EMS reports that he was complaining of chest pain and was given a single nitroglycerin prior to arrival.  The patient was also given albuterol and placed on nonrebreather at 8 L/min with improvement in his oxygen saturations.   HPI  Past Medical History:  Diagnosis Date  . AAA (abdominal aortic aneurysm) ([place])   . Anemia   . Anxiety   . Arthritis    arms and back  . Blindness   . CHF (congestive heart failure) ([place])   . Coronary artery disease    mild   . 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 ESRD on hemodialysis ([place])    started HD 2014-15  . Headache(784.0)   . HTN (hypertension)   . Protein-calorie malnutrition ([place])   . Stroke [facility]) 01/2018   no residual deficits    Patient  Active Problem List   Diagnosis Date Noted  . Acute respiratory failure with hypoxia ([place]) 03/29/2018  . Sleep stage dysfunction 11/10/2017  . 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 Vascular device, implant, or graft complication 51/88/4166  . Peripheral arterial disease ([place]) 10/20/2017  . Non-allergic rhinitis 10/20/2017  . 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 BPH (benign prostatic hyperplasia) 07/07/2017  . At risk for adverse drug reaction 07/02/2017  . Ischemia of extremity 06/28/2017  . Acute pulmonary edema ([place]) 06/27/2017  . Hypertensive urgency 01/24/2017  . 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 Essential hypertension 03/08/2016  . Dyslipidemia 01/12/2016  . Chest pain 08/12/2015  . Accelerated hypertension 08/12/2015  . Carotid stenosis   . Abdominal aortic aneurysm ([place]) 03/06/2015  . Protein-calorie malnutrition, severe ([place]) 05/20/2014  . End-stage renal disease on hemodialysis ([place]) 05/19/2014  . Anemia of renal disease 05/19/2014  . Coronary artery disease, non-occlusive:  09/28/2012  . Secondary hyperparathyroidism ([place]) 09/27/2012  . Non compliance with medical treatment 09/27/2012  . Acute on chronic combined systolic and diastolic CHF (congestive heart failure) ([place]) 10/10/2011  . A-fib ([place]) 10/08/2011  . History of CVA (cerebrovascular accident) 10/07/2011  . Cocaine abuse ([place]) 10/07/2011  . Tobacco use disorder 08/07/2011  . Bruit 08/07/2011    Past Surgical History:  Procedure Laterality Date  . A/V FISTULAGRAM N/A 07/29/2018   Procedure: A/V FISTULAGRAM - left upper extremity;  Surgeon: Waynetta Sandy, MD;  Location:  [facility] INVASIVE CV LAB;  Service: Cardiovascular;  Laterality: N/A;  . AXILLARY-FEMORAL BYPASS GRAFT  06/28/2017   Procedure: BYPASS GRAFT RIGHT AXILLA-BIFEMORAL USING 8MM X 30CM AND 8MM X 60CM HEMASHIELD GOLD GRAFTS;  Surgeon: Rosetta Posner, MD;  Location: [facility];  Service: Vascular;;  . CARDIAC CATHETERIZATION N/A 11/01/2016    Procedure: Left Heart Cath and Coronary Angiography;  Surgeon: Lorretta Harp, MD;  Location: [facility] CV LAB;  Service: Cardiovascular;  Laterality: N/A;  . ENDARTERECTOMY FEMORAL Bilateral 06/28/2017   Procedure: ENDARTERECTOMY BILATERAL FEMORAL ARTERIES;  Surgeon: Rosetta Posner, MD;  Location: [facility];  Service: Vascular;  Laterality: Bilateral;  . FISTULA SUPERFICIALIZATION Left 0/96/2836   Procedure: PLICATION OF ARTERIOVENOUS FISTULA LEFT ARM;  Surgeon: Waynetta Sandy, MD;  Location: [facility];  Service: Vascular;  Laterality: Left;  . FISTULOGRAM Left 05/20/2018   Procedure: FISTULOGRAM WITH BALLOON ANGIOPLASTY LEFT ARM ARTERIOVENOUS FISTULA;  Surgeon: Waynetta Sandy, MD;  Location: [facility];  Service: Vascular;  Laterality: Left;  . INSERTION OF DIALYSIS CATHETER  09/21/2012   Procedure: INSERTION OF DIALYSIS CATHETER;  Surgeon: Angelia Mould, MD;  Location: [facility];  Service: Vascular;  Laterality: N/A;  Right Internal Jugular Placement  . LEFT HEART CATHETERIZATION WITH CORONARY ANGIOGRAM N/A 09/28/2012   Procedure: LEFT HEART CATHETERIZATION WITH CORONARY ANGIOGRAM;  Surgeon: Sherren Mocha, MD;  Location[facility] LAB;  Service: Cardiovascular;  Laterality: N/A;  . LEFT HEART CATHETERIZATION WITH CORONARY ANGIOGRAM N/A 07/07/2014   Procedure: LEFT HEART CATHETERIZATION WITH CORONARY ANGIOGRAM;  Surgeon: Leonie Man, MD;  Location: [facility] C[facility] LAB;  Service: Cardiovascular;  Laterality: N/A;  . PERIPHERAL VASCULAR BALLOON ANGIOPLASTY Left 07/29/2018   Procedure: PERIPHERAL VASCULAR BALLOON ANGIOPLASTY;  Surgeon: Waynetta Sandy, MD;  Location: [place] CV LAB;  Service: Cardiovascular;  Laterality: Left;  CENTRAL VEIN  . PERIPHERAL VASCULAR INTERVENTION Left 07/29/2018   Procedure: PERIPHERAL VASCULAR INTERVENTION;  Surgeon: Waynetta Sandy, MD;  Location: [place] CV LAB;  Service: Cardiovascular;  Laterality: Left;  AXILLARY VEIN  . REVISON OF  ARTERIOVENOUS FISTULA Left 08/17/2018   Procedure: REVISION OF ARTERIOVENOUS FISTULA LEFT ARM;  Surgeon: Waynetta Sandy, MD;  Location: [facility];  Service: Vascular;  Laterality: Left;  . right hand    . TEE WITHOUT CARDIOVERSION  10/09/2011   Procedure: TRANSESOPHAGEAL ECHOCARDIOGRAM (TEE);  Surgeon: Lelon Perla, MD;  Location: [facility] ENDOSCOPY;  Service: Cardiovascular;  Laterality: N/A;        Home Medications    Prior to Admission medications   Medication Sig Start Date End Date Taking? Authorizing Provider  acetaminophen (TYLENOL) 325 MG tablet Take 650 mg by mouth every 6 (six) hours as needed.   Yes [provider]  amLODipine (NORVASC) 10 MG tablet Take 10 mg by mouth at bedtime.  04/13/17  Yes [provider]  aspirin 81 MG chewable tablet Chew 1 tablet (81 mg total) by mouth daily. 11/04/16  Yes Lyda Jester M, PA-C  atorvastatin (LIPITOR) 40 MG tablet Take 40 mg by mouth at bedtime.   Yes [provider]  bisacodyl (DULCOLAX) 10 MG suppository Place 10 mg rectally daily as needed (constipation).    Yes [provider]  cinacalcet (SENSIPAR) 90 MG tablet Take 90 mg by mouth daily with supper.   Yes [provider]  clopidogrel (PLAVIX) 75 MG tablet Take 1 tablet (75 mg total) by mouth daily. 04/05/15  Yes Barton Dubois, MD  docusate sodium (COLACE) 100 MG capsule  Take 200 mg by mouth daily as needed for mild constipation.    Yes [provider]  doxercalciferol (HECTOROL) 4 MCG/2ML injection Inject 1 mL (2 mcg total) into the vein every Monday, Wednesday, and Friday with hemodialysis. 01/15/18  Yes Colbert Ewing, MD  ferric citrate (AURYXIA) 1 GM 210 MG(Fe) tablet Take 630 mg by mouth 3 (three) times daily with meals.    Yes [provider]  gabapentin (NEURONTIN) 300 MG capsule Take 1 capsule (300 mg total) by mouth at bedtime. 01/14/18  Yes Colbert Ewing, MD  Glycopyrrolate [facility] REFILL KIT) 25  MCG/ML SOLN Inhale 25 mcg into the lungs 2 (two) times daily.   Yes [provider]  hydrALAZINE (APRESOLINE) 50 MG tablet Take 50 mg by mouth 3 (three) times daily.   Yes [provider]  HYDROcodone-acetaminophen (NORCO) 5-325 MG tablet Take 1 tablet by mouth every 6 (six) hours as needed for moderate pain. 08/17/18  Yes Dagoberto Ligas, PA-C  Icosapent Ethyl (VASCEPA) 1 g CAPS Take 1 capsule by mouth 2 (two) times daily.   Yes [provider]  isosorbide mononitrate (IMDUR) 30 MG 24 hr tablet Take 30 mg by mouth daily.   Yes [provider]  metoprolol tartrate (LOPRESSOR) 25 MG tablet Take 1 tablet (25 mg total) by mouth 2 (two) times daily. 11/03/16  Yes Lyda Jester M, PA-C  Nutritional Supplements (FEEDING SUPPLEMENT, NEPRO CARB STEADY,) LIQD Take 237 mLs by mouth 2 (two) times daily between meals. 12/11/16  Yes Vann, Jessica U, DO  ondansetron (ZOFRAN) 4 MG tablet Take 4 mg by mouth every 6 (six) hours as needed for nausea or vomiting.   Yes [provider]  senna (SENOKOT) 8.6 MG TABS tablet Take 1 tablet by mouth daily.   Yes [provider]    Family History Family History  Problem Relation Age of Onset  . Heart attack Mother        MI in her 77s  . Diabetes Mother   . Alcohol abuse Father   . Anesthesia problems Neg Hx   . Hypotension Neg Hx   . Malignant hyperthermia Neg Hx   . Pseudochol deficiency Neg Hx     Social History Social History   Tobacco Use  . Smoking status: Current Every Day Smoker    Packs/day: 0.50    Years: 60.00    Pack years: 30.00    Types: Cigarettes  . Smokeless tobacco: Never Used  . Tobacco comment: 5-6 cigarettes per day  Substance Use Topics  . Alcohol use: No    Alcohol/week: 0.0 standard drinks    Comment: Occasional  . Drug use: No    Frequency: 2.0 times per week    Types: "Crack" cocaine     Allergies   Patient has no known allergies.   Review of Systems Review of  Systems  Ten systems reviewed and are negative for acute change, except as noted in the HPI.   Physical Exam Updated Vital Signs BP (!) 101/57 (BP Location: Right Arm)   Pulse 79   Temp 97.9 F (36.6 C) (Axillary)   Resp (!) 30   Wt 59.6 kg   SpO2 100%   BMI 18.85 kg/m   Physical Exam  Constitutional: He is oriented to person, place, and time. He appears well-developed and well-nourished. He appears distressed.  HENT:  Head: Normocephalic and atraumatic.  Eyes: Pupils are equal, round, and reactive to light. Conjunctivae and EOM are normal. No scleral icterus.  +  Arcus senilis  Neck: Normal range of motion. Neck supple.  Cardiovascular: Normal rate and regular rhythm. Exam reveals distant heart sounds.  Obvious shunt over the right side of the abdomen  Pulmonary/Chest: He is in respiratory distress. He has rales.  Extremities are cool but not mottled.  Abdominal: Soft. There is no tenderness.  Musculoskeletal: Normal range of motion. He exhibits no edema.  Trace bilateral edema of the feet  Neurological: He is alert and oriented to person, place, and time.  Skin: Skin is warm and dry. He is not diaphoretic.  Psychiatric: His behavior is normal.  Nursing note and vitals reviewed.    ED Treatments / Results  Labs (all labs ordered are listed, but only abnormal results are displayed) Labs Reviewed  BASIC METABOLIC PANEL - Abnormal; Notable for the following components:      Result Value   Chloride 96 (*)    Glucose, Bld 191 (*)    BUN 29 (*)    Creatinine, Ser 7.30 (*)    GFR calc non Af Amer 7 (*)    GFR calc Af Amer 8 (*)    Anion gap 18 (*)    All other components within normal limits  CBC WITH DIFFERENTIAL/PLATELET - Abnormal; Notable for the following components:   RBC 3.48 (*)    Hemoglobin 10.1 (*)    HCT 31.8 (*)    All other components within normal limits  BRAIN NATRIURETIC PEPTIDE - Abnormal; Notable for the following components:   B Natriuretic  Peptide >4,500.0 (*)    All other components within normal limits  TROPONIN I - Abnormal; Notable for the following components:   Troponin I 0.05 (*)    All other components within normal limits  I-STAT CHEM 8, ED - Abnormal; Notable for the following components:   BUN 32 (*)    Creatinine, Ser 7.80 (*)    Glucose, Bld 190 (*)    Calcium, Ion 1.10 (*)    Hemoglobin 10.9 (*)    HCT 32.0 (*)    All other components within normal limits  BLOOD GAS, VENOUS  RAPID URINE DRUG SCREEN, HOSP PERFORMED  HEMOGLOBIN A1C  LIPID PANEL  TROPONIN I  TROPONIN I  BASIC METABOLIC PANEL  CBC  I-STAT TROPONIN, ED  I-STAT VENOUS BLOOD GAS, ED    EKG EKG Interpretation  Date/Time:  Thursday August 19 2018 17:53:03 EDT Ventricular Rate:  102 PR Interval:    QRS Duration: 109 QT Interval:  396 QTC Calculation: 516 R Axis:   12 Text Interpretation:  Sinus tachycardia Multiple ventricular premature complexes LVH with secondary repolarization abnormality Prolonged QT interval Previous EKG showed diffuse ST depressions Confirmed by Wandra Arthurs ([id number]) on 08/19/2018 6:02:06 PM   Radiology Dg Chest Port 1 View  Result Date: 08/19/2018 CLINICAL DATA:  Shortness of breath EXAM: PORTABLE CHEST 1 VIEW COMPARISON:  04/19/2018, 03/29/2018, 01/11/2018, 12/23/2017 FINDINGS: Stable cardiomediastinal silhouette. Worsened diffuse reticular and interstitial opacity since prior radiograph. Aortic atherosclerosis. No pneumothorax. IMPRESSION: Diffusely increased interstitial and reticular opacity which may reflect interstitial edema or inflammatory process; findings have slightly worsened since 04/19/2018. Electronically Signed   By: Donavan Foil M.D.   On: 08/19/2018 18:58    Procedures .Critical Care Performed by: Margarita Mail, PA-C Authorized by: Margarita Mail, PA-C   Critical care provider statement:    Critical care time (minutes):  45   Critical care was necessary to treat or prevent imminent or  life-threatening deterioration of the following conditions:  Respiratory failure   Critical care was time spent personally by me on the following activities:  Discussions with consultants, evaluation of patient's response to treatment, examination of patient, ordering and performing treatments and interventions, ordering and review of laboratory studies, ordering and review of radiographic studies, pulse oximetry, re-evaluation of patient's condition, obtaining history from patient or surrogate and review of old charts   (including critical care time)  Medications Ordered in ED Medications  morphine 2 MG/ML injection 1 mg (has no administration in time range)  nicotine (NICODERM CQ - dosed in mg/24 hours) patch 21 mg (has no administration in time range)  heparin injection 5,000 Units (has no administration in time range)  acetaminophen (TYLENOL) tablet 650 mg (has no administration in time range)    Or  acetaminophen (TYLENOL) suppository 650 mg (has no administration in time range)  senna-docusate (Senokot-S) tablet 1 tablet (has no administration in time range)  hydrALAZINE (APRESOLINE) injection 5 mg (has no administration in time range)  hydrOXYzine (ATARAX/VISTARIL) tablet 10 mg (has no administration in time range)  zolpidem (AMBIEN) tablet 5 mg (has no administration in time range)  nitroGLYCERIN (NITROSTAT) SL tablet 0.4 mg (has no administration in time range)  Chlorhexidine Gluconate Cloth 2 % PADS 6 each (has no administration in time range)  pentafluoroprop-tetrafluoroeth (GEBAUERS) aerosol 1 application (has no administration in time range)  lidocaine (PF) (XYLOCAINE) 1 % injection 5 mL (has no administration in time range)  lidocaine-prilocaine (EMLA) cream 1 application (has no administration in time range)  0.9 %  sodium chloride infusion (has no administration in time range)  0.9 %  sodium chloride infusion (has no administration in time range)  heparin injection 1,000 Units  (has no administration in time range)  alteplase (CATHFLO ACTIVASE) injection 2 mg (has no administration in time range)  doxercalciferol (HECTOROL) injection 3 mcg (has no administration in time range)  ferric citrate (AURYXIA) tablet 420 mg (has no administration in time range)  cinacalcet (SENSIPAR) tablet 90 mg (has no administration in time range)  aspirin chewable tablet 81 mg (has no administration in time range)  HYDROcodone-acetaminophen (NORCO/VICODIN) 5-325 MG per tablet 1 tablet (has no administration in time range)  amLODipine (NORVASC) tablet 10 mg (has no administration in time range)  atorvastatin (LIPITOR) tablet 40 mg (has no administration in time range)  hydrALAZINE (APRESOLINE) tablet 50 mg (has no administration in time range)  isosorbide mononitrate (IMDUR) 24 hr tablet 30 mg (has no administration in time range)  metoprolol tartrate (LOPRESSOR) tablet 25 mg (has no administration in time range)  cinacalcet (SENSIPAR) tablet 90 mg (has no administration in time range)  bisacodyl (DULCOLAX) suppository 10 mg (has no administration in time range)  docusate sodium (COLACE) capsule 200 mg (has no administration in time range)  senna (SENOKOT) tablet 8.6 mg (has no administration in time range)  clopidogrel (PLAVIX) tablet 75 mg (has no administration in time range)  gabapentin (NEURONTIN) capsule 300 mg (has no administration in time range)  feeding supplement (NEPRO CARB STEADY) liquid 237 mL (has no administration in time range)  Chlorhexidine Gluconate Cloth 2 % PADS 6 each (has no administration in time range)  albuterol (PROVENTIL) (2.5 MG/3ML) 0.083% nebulizer solution 2.5 mg (has no administration in time range)  dextromethorphan-guaiFENesin (MUCINEX DM) 30-600 MG per 12 hr tablet 1 tablet (has no administration in time range)  omega-3 acid ethyl esters (LOVAZA) capsule 1 g (has no administration in time range)  ipratropium (ATROVENT) nebulizer solution 0.5 mg (0  mg  Nebulization Hold 08/20/18 0052)  nitroGLYCERIN 50 mg in dextrose 5 % 250 mL (0.2 mg/mL) infusion (0 mcg/min Intravenous Hold 08/19/18 2238)  albumin human 25 % solution 25 g (25 g Intravenous New Bag/Given 08/19/18 2357)     Initial Impression / Assessment and Plan / ED Course  I have reviewed the triage vital signs and the nursing notes.  Pertinent labs & imaging results that were available during my care of the patient were reviewed by me and considered in my medical decision making (see chart for details).  Clinical Course as of Aug 20 53  Thu Aug 19, 2018  1624 Potassium: 4.1 [AH]    Clinical Course User Index [AH] Margarita Mail, PA-C    Patient with respiratory distress, end-stage renal disease.  Placed on BiPAP and nitro drip with improvement in his symptoms.  His electrolytes appear to be within normal limits and his EKG is unchanged from previous.  His chest pain is improved.  His breathing is improved as well and the patient feels better speaking more easily.  I spoke with nephrology and the patient will be dialyzed.  I also spoke with the hospitalist who will admit the patient for observation and stepdown unit.  Final Clinical Impressions(s) / ED Diagnoses   Final diagnoses:  Respiratory distress  Acute pulmonary edema [facility])    ED Discharge Orders    None       Margarita Mail, PA-C 08/20/18 0054    Drenda Freeze, MD 08/21/18 1620

## 2018-08-19 NOTE — ED Notes (Signed)
Attempted report x1. Left call back number.

## 2018-08-19 NOTE — Progress Notes (Signed)
RT transported pt from ED C28 to 2W21 on BIPAP. Transport was uneventful. Pt resting comfortably. RT called report to receiving RT [facility].

## 2018-08-19 NOTE — ED Notes (Signed)
Called resp

## 2018-08-19 NOTE — ED Triage Notes (Addendum)
Patient arrived via EMS, cal went out for respiratory arrest, patient also complaining of CP upon arrival. Per ems patient was in tripod position, SpO2 of 72% on 2L O2, had full dialysis yesterday. EMS gave 324 ASA, 1 nitrogylcerin tablet, albuterol neb. BP 210/110. Patient AOX4 at this time.

## 2018-08-19 NOTE — Progress Notes (Signed)
RT NOTES: Called to patient's room for bipap. Patient's breathing is labored, unable to complete sentences. Placed on bipap 12/6 45%. Will continue to monitor. Unable to pickup sat d/t cord malfunction. Notified charge nurse.

## 2018-08-20 ENCOUNTER — Encounter (HOSPITAL_COMMUNITY): Payer: Self-pay

## 2018-08-20 ENCOUNTER — Other Ambulatory Visit: Payer: Self-pay

## 2018-08-20 DIAGNOSIS — E1122 Type 2 diabetes mellitus with diabetic chronic kidney disease: Secondary | ICD-10-CM | POA: Diagnosis not present

## 2018-08-20 DIAGNOSIS — Z8673 Personal history of transient ischemic attack (TIA), and cerebral infarction without residual deficits: Secondary | ICD-10-CM | POA: Diagnosis not present

## 2018-08-20 DIAGNOSIS — Z7401 Bed confinement status: Secondary | ICD-10-CM | POA: Diagnosis not present

## 2018-08-20 DIAGNOSIS — I160 Hypertensive urgency: Secondary | ICD-10-CM | POA: Diagnosis not present

## 2018-08-20 DIAGNOSIS — D631 Anemia in chronic kidney disease: Secondary | ICD-10-CM | POA: Diagnosis not present

## 2018-08-20 DIAGNOSIS — I132 Hypertensive heart and chronic kidney disease with heart failure and with stage 5 chronic kidney disease, or end stage renal disease: Secondary | ICD-10-CM | POA: Diagnosis not present

## 2018-08-20 DIAGNOSIS — Z992 Dependence on renal dialysis: Secondary | ICD-10-CM | POA: Diagnosis not present

## 2018-08-20 DIAGNOSIS — N2581 Secondary hyperparathyroidism of renal origin: Secondary | ICD-10-CM | POA: Diagnosis not present

## 2018-08-20 DIAGNOSIS — I120 Hypertensive chronic kidney disease with stage 5 chronic kidney disease or end stage renal disease: Secondary | ICD-10-CM | POA: Diagnosis not present

## 2018-08-20 DIAGNOSIS — N186 End stage renal disease: Secondary | ICD-10-CM | POA: Diagnosis not present

## 2018-08-20 DIAGNOSIS — J9601 Acute respiratory failure with hypoxia: Secondary | ICD-10-CM | POA: Diagnosis not present

## 2018-08-20 DIAGNOSIS — I5043 Acute on chronic combined systolic (congestive) and diastolic (congestive) heart failure: Secondary | ICD-10-CM | POA: Diagnosis not present

## 2018-08-20 DIAGNOSIS — I2510 Atherosclerotic heart disease of native coronary artery without angina pectoris: Secondary | ICD-10-CM | POA: Diagnosis not present

## 2018-08-20 DIAGNOSIS — R079 Chest pain, unspecified: Secondary | ICD-10-CM | POA: Diagnosis not present

## 2018-08-20 DIAGNOSIS — E785 Hyperlipidemia, unspecified: Secondary | ICD-10-CM | POA: Diagnosis not present

## 2018-08-20 DIAGNOSIS — M2550 Pain in unspecified joint: Secondary | ICD-10-CM | POA: Diagnosis not present

## 2018-08-20 LAB — BASIC METABOLIC PANEL
ANION GAP: 18 — AB (ref 5–15)
BUN: 14 mg/dL (ref 8–23)
CHLORIDE: 97 mmol/L — AB (ref 98–111)
CO2: 28 mmol/L (ref 22–32)
Calcium: 10.1 mg/dL (ref 8.9–10.3)
Creatinine, Ser: 4.51 mg/dL — ABNORMAL HIGH (ref 0.61–1.24)
GFR calc non Af Amer: 12 mL/min — ABNORMAL LOW (ref >60)
GFR, EST AFRICAN AMERICAN: 14 mL/min — AB (ref >60)
Glucose, Bld: 96 mg/dL (ref 70–99)
POTASSIUM: 3.7 mmol/L (ref 3.5–5.1)
SODIUM: 143 mmol/L (ref 135–145)

## 2018-08-20 LAB — HEMOGLOBIN A1C
HEMOGLOBIN A1C: 4.8 % (ref 4.8–5.6)
MEAN PLASMA GLUCOSE: 91.06 mg/dL

## 2018-08-20 LAB — CBC
HEMATOCRIT: 34.8 % — AB (ref 39.0–52.0)
Hemoglobin: 11.1 g/dL — ABNORMAL LOW (ref 13.0–17.0)
MCH: 27.8 pg (ref 26.0–34.0)
MCHC: 31.9 g/dL (ref 30.0–36.0)
MCV: 87.2 fL (ref 78.0–100.0)
Platelets: 246 10*3/uL (ref 150–400)
RBC: 3.99 MIL/uL — AB (ref 4.22–5.81)
RDW: 14.7 % (ref 11.5–15.5)
WBC: 6.5 10*3/uL (ref 4.0–10.5)

## 2018-08-20 LAB — TROPONIN I
Troponin I: 0.05 ng/mL (ref <0.03)
Troponin I: 0.06 ng/mL (ref <0.03)
Troponin I: 0.06 ng/mL (ref <0.03)

## 2018-08-20 LAB — LIPID PANEL
CHOLESTEROL: 117 mg/dL (ref 0–200)
HDL: 33 mg/dL — AB (ref >40)
LDL Cholesterol: 72 mg/dL (ref 0–99)
Total CHOL/HDL Ratio: 3.5 RATIO
Triglycerides: 62 mg/dL (ref <150)
VLDL: 12 mg/dL (ref 0–40)

## 2018-08-20 LAB — MRSA PCR SCREENING: MRSA by PCR: NEGATIVE

## 2018-08-20 MED ORDER — DOXERCALCIFEROL 4 MCG/2ML IV SOLN
INTRAVENOUS | Status: AC
  Filled 2018-08-20: qty 2

## 2018-08-20 NOTE — Clinical Social Work Placement (Signed)
   CLINICAL SOCIAL WORK PLACEMENT  NOTE  Date:  08/20/2018  Patient Details  Name: Trevor [name] MRN: [id number] Date of Birth: 07-09-1946  Clinical Social Work is seeking post-discharge placement for this patient at the [place] level of care (*CSW will initial, date and re-position this form in  chart as items are completed):      Patient/family provided with [facility] Work Department's list of facilities offering this level of care within the geographic area requested by the patient (or if unable, by the patient's family).  Yes   Patient/family informed of their freedom to choose among providers that offer the needed level of care, that participate in Medicare, Medicaid or managed care program needed by the patient, have an available bed and are willing to accept the patient.      Patient/family informed of [facility]'s ownership interest in [facility] and [facility], as well as of the fact that they are under no obligation to receive care at these facilities.  PASRR submitted to EDS on       PASRR number received on       Existing PASRR number confirmed on       FL2 transmitted to all facilities in geographic area requested by pt/family on       FL2 transmitted to all facilities within larger geographic area on       Patient informed that his/her managed care company has contracts with or will negotiate with certain facilities, including the following:        Yes   Patient/family informed of bed offers received.  Patient chooses bed at ([facility])     Physician recommends and patient chooses bed at      Patient to be transferred to ([facility]) on 08/20/18.  Patient to be transferred to facility by [facility]     Patient family notified on 08/20/18 of transfer.  Name of family member notified:  Pt is alert and oriented.     PHYSICIAN       Additional Comment:     _______________________________________________ Eileen Stanford, LCSW 08/20/2018, 2:32 PM

## 2018-08-20 NOTE — Clinical Social Work Note (Signed)
Clinical Social Work Assessment  Patient Details  Name: Trevor [name] MRN: [id number] Date of Birth: 18-Jun-1946  Date of referral:  08/20/18               Reason for consult:  Facility Placement                Permission sought to share information with:  Facility Art therapist granted to share information::     Name::        Agency::     Relationship::     Contact Information:     Housing/Transportation Living arrangements for the past 2 months:  [place] of Information:  Patient Patient Interpreter Needed:  None Criminal Activity/Legal Involvement Pertinent to Current Situation/Hospitalization:  No - Comment as needed Significant Relationships:  Siblings, Adult Children Lives with:  Self Do you feel safe going back to the place where you live?  Yes Need for family participation in patient care:  No (Coment)  Care giving concerns:  Pt is alert and oriented.    Social Worker assessment / plan:  CSW spoke with pt at bedside. Pt is from [facility]. Pt is agreeable to return to [facility]. CSW confirmed with facility pt can return at d/c. Pt will d/c today.  Employment status:  Retired Forensic scientist:  Medicare PT Recommendations:  [place] / Referral to community resources:  [place]  Patient/Family's Response to care:  Pt verbalized understanding of CSW role and expressed appreciation for support. Pt denies any concern regarding pt care at this time.   Patient/Family's Understanding of and Emotional Response to Diagnosis, Current Treatment, and Prognosis:  Pt understanding and realistic regarding physical limitations. Pt understands the need for [name] placement at d/c. Pt agreeable to [name] placement at d/c, at this time. Pt's responses emotionally appropriate during conversation with CSW. Pt denies any concern regarding treatment plan at this time. CSW will continue to provide  support and facilitate d/c needs.   Emotional Assessment Appearance:  Appears stated age Attitude/Demeanor/Rapport:  (Patient was appropriate) Affect (typically observed):  Accepting, Appropriate, Calm Orientation:  Oriented to Self, Oriented to Place, Oriented to  Time, Oriented to Situation Alcohol / Substance use:  Not Applicable Psych involvement (Current and /or in the community):  No (Comment)  Discharge Needs  Concerns to be addressed:  Basic Needs, Care Coordination Readmission within the last 30 days:  Yes Current discharge risk:  Dependent with Mobility Barriers to Discharge:  Continued Medical Work up   [employer], LCSW 08/20/2018, 2:29 PM

## 2018-08-20 NOTE — Clinical Social Work Note (Signed)
Clinical Social Worker facilitated patient discharge including contacting patient family and facility to confirm patient discharge plans.  Clinical information faxed to facility and family agreeable with plan.  CSW arranged ambulance transport via [facility] to [place] at [facility] (room 133B).  RN to call [phone] for report prior to discharge.  Clinical Social Worker will sign off for now as social work intervention is no longer needed. Please consult [place] again if new need arises.  [place], [place]

## 2018-08-20 NOTE — Care Management Note (Signed)
Case Management Note  Patient Details  Name: Trevor [name] MRN: [id number] Date of Birth: 09-14-1946  Subjective/Objective:  From [name], presents with acute resp failure, and chest pain, s/p fistula revision on 9/17, also with PSA.                  Action/Plan: NCM will follow for transition of care needs.  Expected Discharge Date:                  Expected Discharge Plan:  Skilled Nursing Facility  In-House Referral:  Clinical Social Work  Discharge planning Services  CM Consult  Post Acute Care Choice:    Choice offered to:     DME Arranged:    DME Agency:     HH Arranged:    [place] Agency:     Status of Service:  In process, will continue to follow  If discussed at Long Length of Stay Meetings, dates discussed:    Additional Comments:  [name], RN 08/20/2018, 10:16 AM

## 2018-08-20 NOTE — Progress Notes (Signed)
Patient transported back to 2W room 21 on BIPAP from dialysis with no events to report.

## 2018-08-20 NOTE — Progress Notes (Signed)
Report called to [place] at [place]. [facility] scheduled to pick up at 1900.

## 2018-08-20 NOTE — Discharge Summary (Signed)
Physician Discharge Summary  Kallon L Moquin MRN:[id number] DOB: 04/04/1946 DOA: 08/19/2018  PCP: [facility]  Admit date: 08/19/2018 Discharge date: 08/20/2018  Time spent: 35 minutes  Recommendations for Outpatient Follow-up:  PCP in1  Week Nephrology at HD on Monday, continue to lower EDW as tolerated  Discharge Diagnoses:  Principal Problem:   Acute respiratory failure with hypoxia (HCC) Active Problems:   Tobacco use disorder   History of CVA (cerebrovascular accident)   Acute on chronic combined systolic and diastolic CHF (congestive heart failure) (HCC)   Coronary artery disease, non-occlusive:    End-stage renal disease on hemodialysis (HCC)   Anemia of renal disease   Chest pain   Dyslipidemia   Essential hypertension   Hypertensive urgency   Acute pulmonary edema (HCC)   Discharge Condition: stable  Diet recommendation: Renal  Filed Weights   08/19/18 2236 08/20/18 0244 08/20/18 0452  Weight: 59.6 kg 57.3 kg 58.2 kg    History of present illness:  Trevor [name] is a 72 y.o. male with medical history significant of hypertension, hyperlipidemia, stroke, ESRD-HD (MWF), status post fistula revision on 08/17/2018, AAA, blindness, CHF with EF of 45%, CAD, tobacco abuse, cocaine abuse, who presents with respiratory distress and chest pain.   Hospital Course:   Acute respiratory failure with hypoxia due to acute flash of pulmonary edema secondary to hypertensive urgency:  -Blood pressure 210/110, Respiratory distress improved with BiPAP.  Chest x-ray showed interstitial pulmonary edema.   -required BIPAP initially, s/p Urgent HD, now off -Nephrology, Dr. Goldsborough was consulted for urgent dialysis -improved after extra Urgent HD, had HD only for 1 and 1/2 hours on Wednesday -likely etiology and needs further dry weight reduction -discharged back to [name] after extra HD -next HD on Monday, compliance emphasized  Hx of Essential hypertension and  hypertensive urgency: -Continue home medications: Amlodipine, hydralazine, metoprolol -improved now after urgent HD   hx of  CAD: Most likely due to demand ischemia secondary to hypertensive urgency -Troponin was 0.05-flat trend, no ACS  Tobacco use disorder: -Nicotine patch  History of CVA (cerebrovascular accident): -ASA, Plavix, Lipitor  Acute on chronic combined systolic and diastolic CHF (congestive heart failure) (HCC): Patient has a pulmonary edema.  2D echo on 01/12/2018 showed EF of 45-50% with grade 1 diastolic dysfunction. -volume managed with HD -s/p Urgent dialysis overnight and then today per renal  End-stage renal disease on hemodialysis (HCC):  -as above  Anemia of renal disease: Hemoglobin stable, 10.9. -Continue iron supplement, EPo  Dyslipidemia: -lipitor    Discharge Exam: Vitals:   08/20/18 0800 08/20/18 0909  BP: (!) 145/67   Pulse: 95   Resp: (!) 25   Temp: 97.8 F (36.6 C)   SpO2: 100% 100%    General: AAOx3 Cardiovascular: S1S2/RRR Respiratory: CTAB  Discharge Instructions   Discharge Instructions    Discharge instructions   Complete by:  As directed    Renal Diet   Increase activity slowly   Complete by:  As directed      Allergies as of 08/20/2018   No Known Allergies     Medication List    TAKE these medications   acetaminophen 325 MG tablet Commonly known as:  TYLENOL Take 650 mg by mouth every 6 (six) hours as needed.   amLODipine 10 MG tablet Commonly known as:  NORVASC Take 10 mg by mouth at bedtime.   aspirin 81 MG chewable tablet Chew 1 tablet (81 mg total) by mouth daily.   atorvastatin   40 MG tablet Commonly known as:  LIPITOR Take 40 mg by mouth at bedtime.   bisacodyl 10 MG suppository Commonly known as:  DULCOLAX Place 10 mg rectally daily as needed (constipation).   cinacalcet 90 MG tablet Commonly known as:  SENSIPAR Take 90 mg by mouth daily with supper.   clopidogrel 75 MG  tablet Commonly known as:  PLAVIX Take 1 tablet (75 mg total) by mouth daily.   docusate sodium 100 MG capsule Commonly known as:  COLACE Take 200 mg by mouth daily as needed for mild constipation.   doxercalciferol 4 MCG/2ML injection Commonly known as:  HECTOROL Inject 1 mL (2 mcg total) into the vein every Monday, Wednesday, and Friday with hemodialysis.   feeding supplement (NEPRO CARB STEADY) Liqd Take 237 mLs by mouth 2 (two) times daily between meals.   ferric citrate 1 GM 210 MG(Fe) tablet Commonly known as:  AURYXIA Take 630 mg by mouth 3 (three) times daily with meals.   gabapentin 300 MG capsule Commonly known as:  NEURONTIN Take 1 capsule (300 mg total) by mouth at bedtime.   hydrALAZINE 50 MG tablet Commonly known as:  APRESOLINE Take 50 mg by mouth 3 (three) times daily.   HYDROcodone-acetaminophen 5-325 MG tablet Commonly known as:  NORCO/VICODIN Take 1 tablet by mouth every 6 (six) hours as needed for moderate pain.   isosorbide mononitrate 30 MG 24 hr tablet Commonly known as:  IMDUR Take 30 mg by mouth daily.   LONHALA MAGNAIR REFILL KIT 25 MCG/ML Soln Generic drug:  Glycopyrrolate Inhale 25 mcg into the lungs 2 (two) times daily.   metoprolol tartrate 25 MG tablet Commonly known as:  LOPRESSOR Take 1 tablet (25 mg total) by mouth 2 (two) times daily.   ondansetron 4 MG tablet Commonly known as:  ZOFRAN Take 4 mg by mouth every 6 (six) hours as needed for nausea or vomiting.   senna 8.6 MG Tabs tablet Commonly known as:  SENOKOT Take 1 tablet by mouth daily.   VASCEPA 1 g Caps Generic drug:  Icosapent Ethyl Take 1 capsule by mouth 2 (two) times daily.      No Known Allergies Follow-up Information    [facility] Schedule an appointment as soon as possible for a visit in 1 week(s).   Contact information: [place] [address] [phone]            The results of significant  diagnostics from this hospitalization (including imaging, microbiology, ancillary and laboratory) are listed below for reference.    Significant Diagnostic Studies: Dg Chest Port 1 View  Result Date: 08/19/2018 CLINICAL DATA:  Shortness of breath EXAM: PORTABLE CHEST 1 VIEW COMPARISON:  04/19/2018, 03/29/2018, 01/11/2018, 12/23/2017 FINDINGS: Stable cardiomediastinal silhouette. Worsened diffuse reticular and interstitial opacity since prior radiograph. Aortic atherosclerosis. No pneumothorax. IMPRESSION: Diffusely increased interstitial and reticular opacity which may reflect interstitial edema or inflammatory process; findings have slightly worsened since 04/19/2018. Electronically Signed   By: Donavan Foil M.D.   On: 08/19/2018 18:58    Microbiology: Recent Results (from the past 240 hour(s))  MRSA PCR Screening     Status: None   Collection Time: 08/20/18  4:46 AM  Result Value Ref Range Status   MRSA by PCR NEGATIVE NEGATIVE Final    Comment:        The GeneXpert MRSA Assay (FDA approved for NASAL specimens only), is one component of a comprehensive MRSA colonization surveillance program. It is not intended to  diagnose MRSA infection nor to guide or monitor treatment for MRSA infections. Performed at [facility], [place] [address], [place], [place] [address]      Labs: Basic Metabolic Panel: Recent Labs  Lab 08/17/18 1056 08/19/18 1755 08/19/18 1804  NA 138 137 136  K 3.9 4.1 4.1  CL  --  96* 99  CO2  --  23  --   GLUCOSE 76 191* 190*  BUN  --  29* 32*  CREATININE  --  7.30* 7.80*  CALCIUM  --  9.7  --    Liver Function Tests: No results for input(s): AST, ALT, ALKPHOS, BILITOT, PROT, ALBUMIN in the last 168 hours. No results for input(s): LIPASE, AMYLASE in the last 168 hours. No results for input(s): AMMONIA in the last 168 hours. CBC: Recent Labs  Lab 08/17/18 1056 08/19/18 1755 08/19/18 1804 08/20/18 0931  WBC  --  7.7  --  6.5  NEUTROABS  --  4.9   --   --   HGB 9.2* 10.1* 10.9* 11.1*  HCT 27.0* 31.8* 32.0* 34.8*  MCV  --  91.4  --  87.2  PLT  --  189  --  246   Cardiac Enzymes: Recent Labs  Lab 08/19/18 2157 08/20/18 0407  TROPONINI 0.05* 0.06*   BNP: BNP (last 3 results) Recent Labs    01/12/18 0515 03/29/18 0146 08/19/18 1755  BNP 4,136.0* >4,500.0* >4,500.0*    ProBNP (last 3 results) No results for input(s): PROBNP in the last 8760 hours.  CBG: No results for input(s): GLUCAP in the last 168 hours.     Signed:  Domenic Polite MD.  [facility] 08/20/2018, 12:56 PM

## 2018-08-20 NOTE — Progress Notes (Signed)
HD tx ended 25 min early @ 0225 d/t increasing TMP of 130/dialyzer clotting off and pt suddenly getting very agitated and pulling on lines, UF goal not met, blood rinsed back, VSS at time of post assess, report given to Fatima Blank, RN

## 2018-08-20 NOTE — Progress Notes (Signed)
HD tx initiated via 15Gx2 w/o problem, pull/push/flush well w/o problem, VSS, will cont to monitor while on HD tx 

## 2018-08-20 NOTE — Progress Notes (Signed)
[facility] Progress Note  Subjective: breathing better after 2.8 L off w/ HD voernight.  On nasal cannula now. Off bipap  Vitals:   08/20/18 0600 08/20/18 0630 08/20/18 0800 08/20/18 0909  BP: (!) 149/55 (!) 159/73 (!) 145/67   Pulse: 94 98 95   Resp: 13 (!) 25 (!) 25   Temp:   97.8 F (36.6 C)   TempSrc:   Axillary   SpO2: 100% 100% 100% 100%  Weight:      Height:        Inpatient medications: . amLODipine  10 mg Oral QHS  . aspirin  81 mg Oral Daily  . atorvastatin  40 mg Oral QHS  . Chlorhexidine Gluconate Cloth  6 each Topical Q0600  . Chlorhexidine Gluconate Cloth  6 each Topical Q0600  . cinacalcet  90 mg Oral Q supper  . cinacalcet  90 mg Oral Q supper  . clopidogrel  75 mg Oral Daily  . doxercalciferol  3 mcg Intravenous Q M,W,F-HD  . feeding supplement (NEPRO CARB STEADY)  237 mL Oral BID BM  . ferric citrate  420 mg Oral TID WC  . gabapentin  300 mg Oral QHS  . heparin  5,000 Units Subcutaneous Q8H  . hydrALAZINE  50 mg Oral TID  . isosorbide mononitrate  30 mg Oral Daily  . metoprolol tartrate  25 mg Oral BID  . nicotine  21 mg Transdermal Daily  . omega-3 acid ethyl esters  1 g Oral BID  . senna  1 tablet Oral Daily   . sodium chloride    . sodium chloride     sodium chloride, sodium chloride, acetaminophen **OR** acetaminophen, albuterol, alteplase, bisacodyl, dextromethorphan-guaiFENesin, docusate sodium, heparin, hydrALAZINE, HYDROcodone-acetaminophen, hydrOXYzine, lidocaine (PF), lidocaine-prilocaine, morphine injection, nitroGLYCERIN, pentafluoroprop-tetrafluoroeth, senna-docusate, zolpidem  Iron/TIBC/Ferritin/ %Sat    Component Value Date/Time   IRON 46 03/30/2018 0343   TIBC 193 (L) 03/30/2018 0343   FERRITIN 1,290 (H) 03/30/2018 0343   IRONPCTSAT 24 03/30/2018 0343    Home meds:  - amlodipine 10/ hydralazine 50 tid/ metoprolol tartrate 25 bid  - imdur 30/ clopidogrel 75 qd/ atorvastatin 40 hs/ aspirin 81  - ferric citrate ac  tid/ gabapentin 300 hs/ [place] magnair 25 ug inhale bid  - hydrocodone aceta 5-325 tid prn/ icosapent ethyl 1 gm bid  Exam: Alert, nasal O2, no distress  no jvd  rales L base, R ok  Cor reg no RG  Abd soft ntnd no ascites  Ext no LE edema  NF, Ox 3  AVF+bruit  Dialysis: [facility]  4h  58.5kg  P4  2/2 bath  Hep none  AVF  - recent fistulogram 8/29  - hect 3  - mircera 75 last on 8/30      Impression: 1. Acute pulm edema - due to vol overload, may have lost body wt.  Had HD last night , SOB improved, plan next HD this afternoon to get back on sched and hopefully lower edw.   2. ESRD usual HD is MWF 3. SP CVA 4. HTN - bp's ok, cont meds x 3 5. CAD - on imdur/ asa/ plavix/ statin  Plan - as above   Kelly Splinter MD [place] pager [phone]   08/20/2018, 11:09 AM   Recent Labs  Lab 08/19/18 1755 08/19/18 1804  NA 137 136  K 4.1 4.1  CL 96* 99  CO2 23  --   GLUCOSE 191* 190*  BUN 29* 32*  CREATININE 7.30* 7.80*  CALCIUM 9.7  --    No results for input(s): AST, ALT, ALKPHOS, BILITOT, PROT in the last 168 hours. Recent Labs  Lab 08/19/18 1755 08/19/18 1804 08/20/18 0931  WBC 7.7  --  6.5  NEUTROABS 4.9  --   --   HGB 10.1* 10.9* 11.1*  HCT 31.8* 32.0* 34.8*  MCV 91.4  --  87.2  PLT 189  --  246

## 2018-08-20 NOTE — NC FL2 (Signed)
[place] MEDICAID FL2 LEVEL OF CARE SCREENING TOOL     IDENTIFICATION  Patient Name: Trevor [name] Birthdate: 08/13/1946 Sex: male Admission Date (Current Location): 08/19/2018  [facility] [employer] and [place] Number:  Herbalist and Address:  [facility], [place] [address] [place], [place] [address]      Provider Number: [id number]  Attending Physician Name and Address:  Domenic Polite, MD  Relative Name and Phone Number:       Current Level of Care: Hospital Recommended Level of Care: [place] Prior Approval Number:    Date Approved/Denied:   PASRR Number:    Discharge Plan: [name]    Current Diagnoses: Patient Active Problem List   Diagnosis Date Noted  . Acute respiratory failure with hypoxia ([place]) 03/29/2018  . Sleep stage dysfunction 11/10/2017  . 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 Vascular device, implant, or graft complication 44/01/4741  . Peripheral arterial disease ([place]) 10/20/2017  . Non-allergic rhinitis 10/20/2017  . 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 BPH (benign prostatic hyperplasia) 07/07/2017  . At risk for adverse drug reaction 07/02/2017  . Ischemia of extremity 06/28/2017  . Acute pulmonary edema ([place]) 06/27/2017  . Hypertensive urgency 01/24/2017  . 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 Essential hypertension 03/08/2016  . Dyslipidemia 01/12/2016  . Chest pain 08/12/2015  . Accelerated hypertension 08/12/2015  . Carotid stenosis   . Abdominal aortic aneurysm ([place]) 03/06/2015  . Protein-calorie malnutrition, severe ([place]) 05/20/2014  . End-stage renal disease on hemodialysis ([place]) 05/19/2014  . Anemia of renal disease 05/19/2014  . Coronary artery disease, non-occlusive:  09/28/2012  . Secondary hyperparathyroidism ([place]) 09/27/2012  . Non compliance with medical treatment 09/27/2012  . Acute on chronic combined systolic and diastolic CHF (congestive heart failure) ([place]) 10/10/2011  . A-fib  ([place]) 10/08/2011  . History of CVA (cerebrovascular accident) 10/07/2011  . Cocaine abuse ([place]) 10/07/2011  . Tobacco use disorder 08/07/2011  . Bruit 08/07/2011    Orientation RESPIRATION BLADDER Height & Weight     Self, Time, Place, Situation  Normal Continent Weight: 117 lb 15.1 oz (53.5 kg) Height:  5\' 10"  (177.8 cm)  BEHAVIORAL SYMPTOMS/MOOD NEUROLOGICAL BOWEL NUTRITION STATUS      Continent Diet(renal with fluid restriction Fluid restriction: 1200 mL Fluid, thin liquids)  AMBULATORY STATUS COMMUNICATION OF NEEDS Skin   Limited Assist Verbally Surgical wounds(Left arm, liquid skin adhesive)                       Personal Care Assistance Level of Assistance  Dressing, Feeding, Bathing Bathing Assistance: Limited assistance Feeding assistance: Independent Dressing Assistance: Limited assistance     Functional Limitations Info  Sight, Hearing, Speech Sight Info: Adequate Hearing Info: Adequate Speech Info: Adequate    SPECIAL CARE FACTORS FREQUENCY  PT (By licensed PT), OT (By licensed OT)                    Contractures Contractures Info: Not present    Additional Factors Info  Code Status, Allergies Code Status Info: Full Code Allergies Info: No known allergies           Current Medications (08/20/2018):  This is the current hospital active medication list Current Facility-Administered Medications  Medication Dose Route Frequency Provider Last Rate Last Dose  . 0.9 %  sodium chloride infusion  100 mL Intravenous PRN Corliss Parish, MD      . 0.9 %  sodium chloride infusion  100 mL  Intravenous PRN Corliss Parish, MD      . acetaminophen (TYLENOL) tablet 650 mg  650 mg Oral Q6H PRN Ivor Costa, MD       Or  . acetaminophen (TYLENOL) suppository 650 mg  650 mg Rectal Q6H PRN Ivor Costa, MD      . albuterol (PROVENTIL) (2.5 MG/3ML) 0.083% nebulizer solution 2.5 mg  2.5 mg Nebulization Q4H PRN Ivor Costa, MD      . alteplase (CATHFLO  ACTIVASE) injection 2 mg  2 mg Intracatheter Once PRN Corliss Parish, MD      . amLODipine (NORVASC) tablet 10 mg  10 mg Oral QHS Ivor Costa, MD      . aspirin chewable tablet 81 mg  81 mg Oral Daily Ivor Costa, MD   81 mg at 08/20/18 5809  . atorvastatin (LIPITOR) tablet 40 mg  40 mg Oral QHS Ivor Costa, MD      . bisacodyl (DULCOLAX) suppository 10 mg  10 mg Rectal Daily PRN Ivor Costa, MD      . Chlorhexidine Gluconate Cloth 2 % PADS 6 each  6 each Topical Q0600 Corliss Parish, MD      . Chlorhexidine Gluconate Cloth 2 % PADS 6 each  6 each Topical Q0600 Corliss Parish, MD   6 each at 08/20/18 [phone]  . cinacalcet (SENSIPAR) tablet 90 mg  90 mg Oral Q supper Corliss Parish, MD      . cinacalcet (SENSIPAR) tablet 90 mg  90 mg Oral Q supper Ivor Costa, MD      . clopidogrel (PLAVIX) tablet 75 mg  75 mg Oral Daily Ivor Costa, MD   75 mg at 08/20/18 8250  . dextromethorphan-guaiFENesin (MUCINEX DM) 30-600 MG per 12 hr tablet 1 tablet  1 tablet Oral BID PRN Ivor Costa, MD      . docusate sodium (COLACE) capsule 200 mg  200 mg Oral Daily PRN Ivor Costa, MD      . doxercalciferol (HECTOROL) injection 3 mcg  3 mcg Intravenous Q M,W,F-HD Corliss Parish, MD      . feeding supplement (NEPRO CARB STEADY) liquid 237 mL  237 mL Oral BID BM Ivor Costa, MD   Stopped at 08/20/18 1348  . ferric citrate (AURYXIA) tablet 420 mg  420 mg Oral TID WC Corliss Parish, MD   Stopped at 08/20/18 1200  . gabapentin (NEURONTIN) capsule 300 mg  300 mg Oral QHS Ivor Costa, MD      . heparin injection 1,000 Units  1,000 Units Dialysis PRN Corliss Parish, MD      . heparin injection 5,000 Units  5,000 Units Subcutaneous Q8H Ivor Costa, MD   Stopped at 08/20/18 1348  . hydrALAZINE (APRESOLINE) injection 5 mg  5 mg Intravenous Q2H PRN Ivor Costa, MD      . hydrALAZINE (APRESOLINE) tablet 50 mg  50 mg Oral TID Ivor Costa, MD   50 mg at 08/20/18 0924  . HYDROcodone-acetaminophen  (NORCO/VICODIN) 5-325 MG per tablet 1 tablet  1 tablet Oral Q6H PRN Ivor Costa, MD      . hydrOXYzine (ATARAX/VISTARIL) tablet 10 mg  10 mg Oral TID PRN Ivor Costa, MD      . isosorbide mononitrate (IMDUR) 24 hr tablet 30 mg  30 mg Oral Daily Ivor Costa, MD   30 mg at 08/20/18 0924  . lidocaine (PF) (XYLOCAINE) 1 % injection 5 mL  5 mL Intradermal PRN Corliss Parish, MD      . lidocaine-prilocaine (EMLA) cream 1 application  1 application Topical PRN Corliss Parish, MD      . metoprolol tartrate (LOPRESSOR) tablet 25 mg  25 mg Oral BID Ivor Costa, MD   25 mg at 08/20/18 0924  . morphine 2 MG/ML injection 1 mg  1 mg Intravenous Q4H PRN Ivor Costa, MD      . nicotine (NICODERM CQ - dosed in mg/24 hours) patch 21 mg  21 mg Transdermal Daily Ivor Costa, MD   21 mg at 08/20/18 5027  . nitroGLYCERIN (NITROSTAT) SL tablet 0.4 mg  0.4 mg Sublingual Q5 min PRN Ivor Costa, MD      . omega-3 acid ethyl esters (LOVAZA) capsule 1 g  1 g Oral BID Ivor Costa, MD   1 g at 08/20/18 7412  . pentafluoroprop-tetrafluoroeth (GEBAUERS) aerosol 1 application  1 application Topical PRN Corliss Parish, MD      . senna (SENOKOT) tablet 8.6 mg  1 tablet Oral Daily Ivor Costa, MD   8.6 mg at 08/20/18 8786  . senna-docusate (Senokot-S) tablet 1 tablet  1 tablet Oral QHS PRN Ivor Costa, MD      . zolpidem (AMBIEN) tablet 5 mg  5 mg Oral QHS PRN Ivor Costa, MD         Discharge Medications: Please see discharge summary for a list of discharge medications.  Relevant Imaging Results:  Relevant Lab Results:   Additional Information [id number]. Dialysis patient MWF - [place], LCSW

## 2018-08-23 DIAGNOSIS — M6281 Muscle weakness (generalized): Secondary | ICD-10-CM | POA: Diagnosis not present

## 2018-08-23 DIAGNOSIS — I429 Cardiomyopathy, unspecified: Secondary | ICD-10-CM | POA: Diagnosis not present

## 2018-08-23 DIAGNOSIS — J810 Acute pulmonary edema: Secondary | ICD-10-CM | POA: Diagnosis not present

## 2018-08-23 DIAGNOSIS — J9601 Acute respiratory failure with hypoxia: Secondary | ICD-10-CM | POA: Diagnosis not present

## 2018-08-23 DIAGNOSIS — I5042 Chronic combined systolic (congestive) and diastolic (congestive) heart failure: Secondary | ICD-10-CM | POA: Diagnosis not present

## 2018-08-23 DIAGNOSIS — N186 End stage renal disease: Secondary | ICD-10-CM | POA: Diagnosis not present

## 2018-08-24 DIAGNOSIS — M6281 Muscle weakness (generalized): Secondary | ICD-10-CM | POA: Diagnosis not present

## 2018-08-24 DIAGNOSIS — I5042 Chronic combined systolic (congestive) and diastolic (congestive) heart failure: Secondary | ICD-10-CM | POA: Diagnosis not present

## 2018-08-24 DIAGNOSIS — I10 Essential (primary) hypertension: Secondary | ICD-10-CM | POA: Diagnosis not present

## 2018-08-24 DIAGNOSIS — N186 End stage renal disease: Secondary | ICD-10-CM | POA: Diagnosis not present

## 2018-08-24 DIAGNOSIS — J9601 Acute respiratory failure with hypoxia: Secondary | ICD-10-CM | POA: Diagnosis not present

## 2018-08-24 DIAGNOSIS — I639 Cerebral infarction, unspecified: Secondary | ICD-10-CM | POA: Diagnosis not present

## 2018-08-25 DIAGNOSIS — M6281 Muscle weakness (generalized): Secondary | ICD-10-CM | POA: Diagnosis not present

## 2018-08-25 DIAGNOSIS — N186 End stage renal disease: Secondary | ICD-10-CM | POA: Diagnosis not present

## 2018-08-25 DIAGNOSIS — I5042 Chronic combined systolic (congestive) and diastolic (congestive) heart failure: Secondary | ICD-10-CM | POA: Diagnosis not present

## 2018-08-26 DIAGNOSIS — N186 End stage renal disease: Secondary | ICD-10-CM | POA: Diagnosis not present

## 2018-08-26 DIAGNOSIS — M6281 Muscle weakness (generalized): Secondary | ICD-10-CM | POA: Diagnosis not present

## 2018-08-26 DIAGNOSIS — I5042 Chronic combined systolic (congestive) and diastolic (congestive) heart failure: Secondary | ICD-10-CM | POA: Diagnosis not present

## 2018-08-27 DIAGNOSIS — N186 End stage renal disease: Secondary | ICD-10-CM | POA: Diagnosis not present

## 2018-08-27 DIAGNOSIS — I5042 Chronic combined systolic (congestive) and diastolic (congestive) heart failure: Secondary | ICD-10-CM | POA: Diagnosis not present

## 2018-08-27 DIAGNOSIS — M6281 Muscle weakness (generalized): Secondary | ICD-10-CM | POA: Diagnosis not present

## 2018-08-31 DIAGNOSIS — R2689 Other abnormalities of gait and mobility: Secondary | ICD-10-CM | POA: Diagnosis not present

## 2018-08-31 DIAGNOSIS — G629 Polyneuropathy, unspecified: Secondary | ICD-10-CM | POA: Diagnosis not present

## 2018-08-31 DIAGNOSIS — N186 End stage renal disease: Secondary | ICD-10-CM | POA: Diagnosis not present

## 2018-08-31 DIAGNOSIS — M6281 Muscle weakness (generalized): Secondary | ICD-10-CM | POA: Diagnosis not present

## 2018-08-31 DIAGNOSIS — I5042 Chronic combined systolic (congestive) and diastolic (congestive) heart failure: Secondary | ICD-10-CM | POA: Diagnosis not present

## 2018-09-01 DIAGNOSIS — I5042 Chronic combined systolic (congestive) and diastolic (congestive) heart failure: Secondary | ICD-10-CM | POA: Diagnosis not present

## 2018-09-01 DIAGNOSIS — M6281 Muscle weakness (generalized): Secondary | ICD-10-CM | POA: Diagnosis not present

## 2018-09-01 DIAGNOSIS — G629 Polyneuropathy, unspecified: Secondary | ICD-10-CM | POA: Diagnosis not present

## 2018-09-01 DIAGNOSIS — N186 End stage renal disease: Secondary | ICD-10-CM | POA: Diagnosis not present

## 2018-09-01 DIAGNOSIS — R2689 Other abnormalities of gait and mobility: Secondary | ICD-10-CM | POA: Diagnosis not present

## 2018-09-02 DIAGNOSIS — G629 Polyneuropathy, unspecified: Secondary | ICD-10-CM | POA: Diagnosis not present

## 2018-09-02 DIAGNOSIS — N186 End stage renal disease: Secondary | ICD-10-CM | POA: Diagnosis not present

## 2018-09-02 DIAGNOSIS — R2689 Other abnormalities of gait and mobility: Secondary | ICD-10-CM | POA: Diagnosis not present

## 2018-09-02 DIAGNOSIS — I5042 Chronic combined systolic (congestive) and diastolic (congestive) heart failure: Secondary | ICD-10-CM | POA: Diagnosis not present

## 2018-09-02 DIAGNOSIS — M6281 Muscle weakness (generalized): Secondary | ICD-10-CM | POA: Diagnosis not present

## 2018-09-03 DIAGNOSIS — I5042 Chronic combined systolic (congestive) and diastolic (congestive) heart failure: Secondary | ICD-10-CM | POA: Diagnosis not present

## 2018-09-03 DIAGNOSIS — G629 Polyneuropathy, unspecified: Secondary | ICD-10-CM | POA: Diagnosis not present

## 2018-09-03 DIAGNOSIS — M6281 Muscle weakness (generalized): Secondary | ICD-10-CM | POA: Diagnosis not present

## 2018-09-03 DIAGNOSIS — R2689 Other abnormalities of gait and mobility: Secondary | ICD-10-CM | POA: Diagnosis not present

## 2018-09-03 DIAGNOSIS — N186 End stage renal disease: Secondary | ICD-10-CM | POA: Diagnosis not present

## 2018-09-06 DIAGNOSIS — M6281 Muscle weakness (generalized): Secondary | ICD-10-CM | POA: Diagnosis not present

## 2018-09-06 DIAGNOSIS — N186 End stage renal disease: Secondary | ICD-10-CM | POA: Diagnosis not present

## 2018-09-06 DIAGNOSIS — G629 Polyneuropathy, unspecified: Secondary | ICD-10-CM | POA: Diagnosis not present

## 2018-09-06 DIAGNOSIS — I5042 Chronic combined systolic (congestive) and diastolic (congestive) heart failure: Secondary | ICD-10-CM | POA: Diagnosis not present

## 2018-09-06 DIAGNOSIS — R2689 Other abnormalities of gait and mobility: Secondary | ICD-10-CM | POA: Diagnosis not present

## 2018-09-07 DIAGNOSIS — I5042 Chronic combined systolic (congestive) and diastolic (congestive) heart failure: Secondary | ICD-10-CM | POA: Diagnosis not present

## 2018-09-07 DIAGNOSIS — G629 Polyneuropathy, unspecified: Secondary | ICD-10-CM | POA: Diagnosis not present

## 2018-09-07 DIAGNOSIS — N186 End stage renal disease: Secondary | ICD-10-CM | POA: Diagnosis not present

## 2018-09-07 DIAGNOSIS — M6281 Muscle weakness (generalized): Secondary | ICD-10-CM | POA: Diagnosis not present

## 2018-09-07 DIAGNOSIS — R2689 Other abnormalities of gait and mobility: Secondary | ICD-10-CM | POA: Diagnosis not present

## 2018-09-08 DIAGNOSIS — I5042 Chronic combined systolic (congestive) and diastolic (congestive) heart failure: Secondary | ICD-10-CM | POA: Diagnosis not present

## 2018-09-08 DIAGNOSIS — M6281 Muscle weakness (generalized): Secondary | ICD-10-CM | POA: Diagnosis not present

## 2018-09-08 DIAGNOSIS — R2689 Other abnormalities of gait and mobility: Secondary | ICD-10-CM | POA: Diagnosis not present

## 2018-09-08 DIAGNOSIS — N186 End stage renal disease: Secondary | ICD-10-CM | POA: Diagnosis not present

## 2018-09-08 DIAGNOSIS — G629 Polyneuropathy, unspecified: Secondary | ICD-10-CM | POA: Diagnosis not present

## 2018-09-09 DIAGNOSIS — R2689 Other abnormalities of gait and mobility: Secondary | ICD-10-CM | POA: Diagnosis not present

## 2018-09-09 DIAGNOSIS — I5042 Chronic combined systolic (congestive) and diastolic (congestive) heart failure: Secondary | ICD-10-CM | POA: Diagnosis not present

## 2018-09-09 DIAGNOSIS — N186 End stage renal disease: Secondary | ICD-10-CM | POA: Diagnosis not present

## 2018-09-09 DIAGNOSIS — G629 Polyneuropathy, unspecified: Secondary | ICD-10-CM | POA: Diagnosis not present

## 2018-09-09 DIAGNOSIS — M6281 Muscle weakness (generalized): Secondary | ICD-10-CM | POA: Diagnosis not present

## 2018-09-10 DIAGNOSIS — N186 End stage renal disease: Secondary | ICD-10-CM | POA: Diagnosis not present

## 2018-09-10 DIAGNOSIS — R2689 Other abnormalities of gait and mobility: Secondary | ICD-10-CM | POA: Diagnosis not present

## 2018-09-10 DIAGNOSIS — G629 Polyneuropathy, unspecified: Secondary | ICD-10-CM | POA: Diagnosis not present

## 2018-09-10 DIAGNOSIS — I5042 Chronic combined systolic (congestive) and diastolic (congestive) heart failure: Secondary | ICD-10-CM | POA: Diagnosis not present

## 2018-09-10 DIAGNOSIS — M6281 Muscle weakness (generalized): Secondary | ICD-10-CM | POA: Diagnosis not present

## 2018-09-13 DIAGNOSIS — M6281 Muscle weakness (generalized): Secondary | ICD-10-CM | POA: Diagnosis not present

## 2018-09-13 DIAGNOSIS — R2689 Other abnormalities of gait and mobility: Secondary | ICD-10-CM | POA: Diagnosis not present

## 2018-09-13 DIAGNOSIS — N186 End stage renal disease: Secondary | ICD-10-CM | POA: Diagnosis not present

## 2018-09-13 DIAGNOSIS — I5042 Chronic combined systolic (congestive) and diastolic (congestive) heart failure: Secondary | ICD-10-CM | POA: Diagnosis not present

## 2018-09-13 DIAGNOSIS — G629 Polyneuropathy, unspecified: Secondary | ICD-10-CM | POA: Diagnosis not present

## 2018-09-14 DIAGNOSIS — I10 Essential (primary) hypertension: Secondary | ICD-10-CM | POA: Diagnosis not present

## 2018-09-14 DIAGNOSIS — G629 Polyneuropathy, unspecified: Secondary | ICD-10-CM | POA: Diagnosis not present

## 2018-09-14 DIAGNOSIS — I639 Cerebral infarction, unspecified: Secondary | ICD-10-CM | POA: Diagnosis not present

## 2018-09-14 DIAGNOSIS — I5042 Chronic combined systolic (congestive) and diastolic (congestive) heart failure: Secondary | ICD-10-CM | POA: Diagnosis not present

## 2018-09-14 DIAGNOSIS — R2689 Other abnormalities of gait and mobility: Secondary | ICD-10-CM | POA: Diagnosis not present

## 2018-09-14 DIAGNOSIS — N186 End stage renal disease: Secondary | ICD-10-CM | POA: Diagnosis not present

## 2018-09-14 DIAGNOSIS — I426 Alcoholic cardiomyopathy: Secondary | ICD-10-CM | POA: Diagnosis not present

## 2018-09-14 DIAGNOSIS — M6281 Muscle weakness (generalized): Secondary | ICD-10-CM | POA: Diagnosis not present

## 2018-09-15 DIAGNOSIS — M6281 Muscle weakness (generalized): Secondary | ICD-10-CM | POA: Diagnosis not present

## 2018-09-15 DIAGNOSIS — I5042 Chronic combined systolic (congestive) and diastolic (congestive) heart failure: Secondary | ICD-10-CM | POA: Diagnosis not present

## 2018-09-15 DIAGNOSIS — N186 End stage renal disease: Secondary | ICD-10-CM | POA: Diagnosis not present

## 2018-09-15 DIAGNOSIS — R2689 Other abnormalities of gait and mobility: Secondary | ICD-10-CM | POA: Diagnosis not present

## 2018-09-15 DIAGNOSIS — G629 Polyneuropathy, unspecified: Secondary | ICD-10-CM | POA: Diagnosis not present

## 2018-09-16 DIAGNOSIS — G629 Polyneuropathy, unspecified: Secondary | ICD-10-CM | POA: Diagnosis not present

## 2018-09-16 DIAGNOSIS — N186 End stage renal disease: Secondary | ICD-10-CM | POA: Diagnosis not present

## 2018-09-16 DIAGNOSIS — M6281 Muscle weakness (generalized): Secondary | ICD-10-CM | POA: Diagnosis not present

## 2018-09-16 DIAGNOSIS — I5042 Chronic combined systolic (congestive) and diastolic (congestive) heart failure: Secondary | ICD-10-CM | POA: Diagnosis not present

## 2018-09-16 DIAGNOSIS — R2689 Other abnormalities of gait and mobility: Secondary | ICD-10-CM | POA: Diagnosis not present

## 2018-09-17 DIAGNOSIS — G629 Polyneuropathy, unspecified: Secondary | ICD-10-CM | POA: Diagnosis not present

## 2018-09-17 DIAGNOSIS — I5042 Chronic combined systolic (congestive) and diastolic (congestive) heart failure: Secondary | ICD-10-CM | POA: Diagnosis not present

## 2018-09-17 DIAGNOSIS — M6281 Muscle weakness (generalized): Secondary | ICD-10-CM | POA: Diagnosis not present

## 2018-09-17 DIAGNOSIS — N186 End stage renal disease: Secondary | ICD-10-CM | POA: Diagnosis not present

## 2018-09-17 DIAGNOSIS — R2689 Other abnormalities of gait and mobility: Secondary | ICD-10-CM | POA: Diagnosis not present

## 2018-09-18 DIAGNOSIS — R2689 Other abnormalities of gait and mobility: Secondary | ICD-10-CM | POA: Diagnosis not present

## 2018-09-18 DIAGNOSIS — I5042 Chronic combined systolic (congestive) and diastolic (congestive) heart failure: Secondary | ICD-10-CM | POA: Diagnosis not present

## 2018-09-18 DIAGNOSIS — M6281 Muscle weakness (generalized): Secondary | ICD-10-CM | POA: Diagnosis not present

## 2018-09-18 DIAGNOSIS — N186 End stage renal disease: Secondary | ICD-10-CM | POA: Diagnosis not present

## 2018-09-18 DIAGNOSIS — G629 Polyneuropathy, unspecified: Secondary | ICD-10-CM | POA: Diagnosis not present

## 2018-09-20 ENCOUNTER — Inpatient Hospital Stay (HOSPITAL_COMMUNITY)
Admission: EM | Admit: 2018-09-20 | Discharge: 2018-09-22 | DRG: 640 | Disposition: A | Discharge status: Home / Self Care | Payer: Medicare Other | Source: Skilled Nursing Facility | Attending: Family Medicine | Admitting: Family Medicine

## 2018-09-20 ENCOUNTER — Other Ambulatory Visit: Payer: Self-pay

## 2018-09-20 ENCOUNTER — Encounter (HOSPITAL_COMMUNITY): Payer: Self-pay | Admitting: Oncology

## 2018-09-20 ENCOUNTER — Emergency Department (HOSPITAL_COMMUNITY): Payer: Medicare Other

## 2018-09-20 DIAGNOSIS — R0902 Hypoxemia: Secondary | ICD-10-CM

## 2018-09-20 DIAGNOSIS — Z7902 Long term (current) use of antithrombotics/antiplatelets: Secondary | ICD-10-CM | POA: Diagnosis not present

## 2018-09-20 DIAGNOSIS — Z833 Family history of diabetes mellitus: Secondary | ICD-10-CM | POA: Diagnosis not present

## 2018-09-20 DIAGNOSIS — D631 Anemia in chronic kidney disease: Secondary | ICD-10-CM | POA: Diagnosis not present

## 2018-09-20 DIAGNOSIS — Z7982 Long term (current) use of aspirin: Secondary | ICD-10-CM

## 2018-09-20 DIAGNOSIS — I120 Hypertensive chronic kidney disease with stage 5 chronic kidney disease or end stage renal disease: Secondary | ICD-10-CM | POA: Diagnosis not present

## 2018-09-20 DIAGNOSIS — F17210 Nicotine dependence, cigarettes, uncomplicated: Secondary | ICD-10-CM | POA: Diagnosis present

## 2018-09-20 DIAGNOSIS — Z992 Dependence on renal dialysis: Secondary | ICD-10-CM | POA: Diagnosis not present

## 2018-09-20 DIAGNOSIS — Z6821 Body mass index (BMI) 21.0-21.9, adult: Secondary | ICD-10-CM | POA: Diagnosis not present

## 2018-09-20 DIAGNOSIS — Z811 Family history of alcohol abuse and dependence: Secondary | ICD-10-CM

## 2018-09-20 DIAGNOSIS — N186 End stage renal disease: Secondary | ICD-10-CM | POA: Diagnosis present

## 2018-09-20 DIAGNOSIS — I714 Abdominal aortic aneurysm, without rupture: Secondary | ICD-10-CM | POA: Diagnosis present

## 2018-09-20 DIAGNOSIS — J810 Acute pulmonary edema: Secondary | ICD-10-CM | POA: Diagnosis not present

## 2018-09-20 DIAGNOSIS — R0602 Shortness of breath: Secondary | ICD-10-CM | POA: Diagnosis not present

## 2018-09-20 DIAGNOSIS — D649 Anemia, unspecified: Secondary | ICD-10-CM | POA: Diagnosis present

## 2018-09-20 DIAGNOSIS — M1990 Unspecified osteoarthritis, unspecified site: Secondary | ICD-10-CM | POA: Diagnosis present

## 2018-09-20 DIAGNOSIS — E875 Hyperkalemia: Secondary | ICD-10-CM | POA: Diagnosis not present

## 2018-09-20 DIAGNOSIS — E785 Hyperlipidemia, unspecified: Secondary | ICD-10-CM | POA: Diagnosis present

## 2018-09-20 DIAGNOSIS — K5900 Constipation, unspecified: Secondary | ICD-10-CM | POA: Diagnosis present

## 2018-09-20 DIAGNOSIS — R0689 Other abnormalities of breathing: Secondary | ICD-10-CM | POA: Diagnosis not present

## 2018-09-20 DIAGNOSIS — I132 Hypertensive heart and chronic kidney disease with heart failure and with stage 5 chronic kidney disease, or end stage renal disease: Secondary | ICD-10-CM | POA: Diagnosis present

## 2018-09-20 DIAGNOSIS — I739 Peripheral vascular disease, unspecified: Secondary | ICD-10-CM | POA: Diagnosis present

## 2018-09-20 DIAGNOSIS — I2510 Atherosclerotic heart disease of native coronary artery without angina pectoris: Secondary | ICD-10-CM | POA: Diagnosis present

## 2018-09-20 DIAGNOSIS — Z8673 Personal history of transient ischemic attack (TIA), and cerebral infarction without residual deficits: Secondary | ICD-10-CM | POA: Diagnosis not present

## 2018-09-20 DIAGNOSIS — J9601 Acute respiratory failure with hypoxia: Secondary | ICD-10-CM | POA: Diagnosis present

## 2018-09-20 DIAGNOSIS — I5042 Chronic combined systolic (congestive) and diastolic (congestive) heart failure: Secondary | ICD-10-CM | POA: Diagnosis present

## 2018-09-20 DIAGNOSIS — H547 Unspecified visual loss: Secondary | ICD-10-CM | POA: Diagnosis present

## 2018-09-20 DIAGNOSIS — M2550 Pain in unspecified joint: Secondary | ICD-10-CM | POA: Diagnosis not present

## 2018-09-20 DIAGNOSIS — M898X9 Other specified disorders of bone, unspecified site: Secondary | ICD-10-CM | POA: Diagnosis present

## 2018-09-20 DIAGNOSIS — Z9115 Patient's noncompliance with renal dialysis: Secondary | ICD-10-CM | POA: Diagnosis not present

## 2018-09-20 DIAGNOSIS — I10 Essential (primary) hypertension: Secondary | ICD-10-CM | POA: Diagnosis not present

## 2018-09-20 DIAGNOSIS — J449 Chronic obstructive pulmonary disease, unspecified: Secondary | ICD-10-CM | POA: Diagnosis present

## 2018-09-20 DIAGNOSIS — N2581 Secondary hyperparathyroidism of renal origin: Secondary | ICD-10-CM | POA: Diagnosis present

## 2018-09-20 DIAGNOSIS — E46 Unspecified protein-calorie malnutrition: Secondary | ICD-10-CM | POA: Diagnosis present

## 2018-09-20 DIAGNOSIS — N400 Enlarged prostate without lower urinary tract symptoms: Secondary | ICD-10-CM | POA: Diagnosis present

## 2018-09-20 DIAGNOSIS — R069 Unspecified abnormalities of breathing: Secondary | ICD-10-CM | POA: Diagnosis not present

## 2018-09-20 DIAGNOSIS — Z7401 Bed confinement status: Secondary | ICD-10-CM | POA: Diagnosis not present

## 2018-09-20 DIAGNOSIS — Z8249 Family history of ischemic heart disease and other diseases of the circulatory system: Secondary | ICD-10-CM

## 2018-09-20 DIAGNOSIS — J811 Chronic pulmonary edema: Secondary | ICD-10-CM | POA: Diagnosis present

## 2018-09-20 DIAGNOSIS — I4891 Unspecified atrial fibrillation: Secondary | ICD-10-CM | POA: Diagnosis present

## 2018-09-20 LAB — CBC WITH DIFFERENTIAL/PLATELET
ABS IMMATURE GRANULOCYTES: 0.07 10*3/uL (ref 0.00–0.07)
BASOS PCT: 1 %
Basophils Absolute: 0.1 10*3/uL (ref 0.0–0.1)
Eosinophils Absolute: 0.3 10*3/uL (ref 0.0–0.5)
Eosinophils Relative: 2 %
HCT: 38 % — ABNORMAL LOW (ref 39.0–52.0)
Hemoglobin: 11.7 g/dL — ABNORMAL LOW (ref 13.0–17.0)
IMMATURE GRANULOCYTES: 1 %
Lymphocytes Relative: 7 %
Lymphs Abs: 0.9 10*3/uL (ref 0.7–4.0)
MCH: 29.3 pg (ref 26.0–34.0)
MCHC: 30.8 g/dL (ref 30.0–36.0)
MCV: 95 fL (ref 80.0–100.0)
MONOS PCT: 4 %
Monocytes Absolute: 0.5 10*3/uL (ref 0.1–1.0)
NEUTROS ABS: 10.2 10*3/uL — AB (ref 1.7–7.7)
NEUTROS PCT: 85 %
PLATELETS: 95 10*3/uL — AB (ref 150–400)
RBC: 4 MIL/uL — AB (ref 4.22–5.81)
RDW: 15 % (ref 11.5–15.5)
WBC: 12 10*3/uL — AB (ref 4.0–10.5)
nRBC: 0 % (ref 0.0–0.2)

## 2018-09-20 LAB — POTASSIUM: POTASSIUM: 3.4 mmol/L — AB (ref 3.5–5.1)

## 2018-09-20 LAB — BASIC METABOLIC PANEL
ANION GAP: 16 — AB (ref 5–15)
BUN: 41 mg/dL — ABNORMAL HIGH (ref 8–23)
CO2: 22 mmol/L (ref 22–32)
Calcium: 8.4 mg/dL — ABNORMAL LOW (ref 8.9–10.3)
Chloride: 98 mmol/L (ref 98–111)
Creatinine, Ser: 8.65 mg/dL — ABNORMAL HIGH (ref 0.61–1.24)
GFR, EST AFRICAN AMERICAN: 6 mL/min — AB (ref >60)
GFR, EST NON AFRICAN AMERICAN: 5 mL/min — AB (ref >60)
Glucose, Bld: 99 mg/dL (ref 70–99)
POTASSIUM: 7.4 mmol/L — AB (ref 3.5–5.1)
SODIUM: 136 mmol/L (ref 135–145)

## 2018-09-20 LAB — I-STAT ARTERIAL BLOOD GAS, ED
Acid-Base Excess: 2 mmol/L (ref 0.0–2.0)
Bicarbonate: 26 mmol/L (ref 20.0–28.0)
O2 Saturation: 94 %
PCO2 ART: 34.9 mmHg (ref 32.0–48.0)
PH ART: 7.478 — AB (ref 7.350–7.450)
Patient temperature: 97.8
TCO2: 27 mmol/L (ref 22–32)
pO2, Arterial: 64 mmHg — ABNORMAL LOW (ref 83.0–108.0)

## 2018-09-20 LAB — TROPONIN I
Troponin I: 0.05 ng/mL (ref <0.03)
Troponin I: 0.06 ng/mL (ref <0.03)

## 2018-09-20 LAB — I-STAT TROPONIN, ED: TROPONIN I, POC: 0.05 ng/mL (ref 0.00–0.08)

## 2018-09-20 LAB — MRSA PCR SCREENING: MRSA BY PCR: NEGATIVE

## 2018-09-20 MED ORDER — ATORVASTATIN CALCIUM 40 MG PO TABS
40.0000 mg | ORAL_TABLET | Freq: Every day | ORAL | Status: DC
  Administered 2018-09-20 – 2018-09-21 (×2): 40 mg via ORAL
  Filled 2018-09-20 (×2): qty 1

## 2018-09-20 MED ORDER — HEPARIN SODIUM (PORCINE) 5000 UNIT/ML IJ SOLN
5000.0000 [IU] | Freq: Three times a day (TID) | INTRAMUSCULAR | Status: DC
  Administered 2018-09-20 – 2018-09-22 (×6): 5000 [IU] via SUBCUTANEOUS
  Filled 2018-09-20 (×6): qty 1

## 2018-09-20 MED ORDER — CINACALCET HCL 30 MG PO TABS
90.0000 mg | ORAL_TABLET | Freq: Every day | ORAL | Status: DC
  Administered 2018-09-20 – 2018-09-22 (×3): 90 mg via ORAL
  Filled 2018-09-20 (×3): qty 3

## 2018-09-20 MED ORDER — ACETAMINOPHEN 325 MG PO TABS: 650.0000 mg | ORAL_TABLET | Freq: Four times a day (QID) | ORAL | Status: DC | PRN

## 2018-09-20 MED ORDER — SIMETHICONE 80 MG PO CHEW
80.0000 mg | CHEWABLE_TABLET | Freq: Four times a day (QID) | ORAL | Status: DC | PRN
  Administered 2018-09-20: 80 mg via ORAL
  Filled 2018-09-20: qty 1

## 2018-09-20 MED ORDER — ISOSORBIDE MONONITRATE ER 30 MG PO TB24
30.0000 mg | ORAL_TABLET | Freq: Every day | ORAL | Status: DC
  Administered 2018-09-20 – 2018-09-21 (×2): 30 mg via ORAL
  Filled 2018-09-20 (×3): qty 1

## 2018-09-20 MED ORDER — SENNA 8.6 MG PO TABS
1.0000 | ORAL_TABLET | Freq: Every day | ORAL | Status: DC
  Administered 2018-09-20 – 2018-09-21 (×2): 8.6 mg via ORAL
  Filled 2018-09-20 (×3): qty 1

## 2018-09-20 MED ORDER — OMEGA-3-ACID ETHYL ESTERS 1 G PO CAPS
1.0000 g | ORAL_CAPSULE | Freq: Two times a day (BID) | ORAL | Status: DC
  Administered 2018-09-20 – 2018-09-22 (×4): 1 g via ORAL
  Filled 2018-09-20 (×6): qty 1

## 2018-09-20 MED ORDER — GLYCOPYRROLATE 25 MCG/ML IN SOLN: 25.0000 ug | Freq: Two times a day (BID) | RESPIRATORY_TRACT | Status: DC

## 2018-09-20 MED ORDER — ACETAMINOPHEN 650 MG RE SUPP: 650.0000 mg | Freq: Four times a day (QID) | RECTAL | Status: DC | PRN

## 2018-09-20 MED ORDER — BISACODYL 10 MG RE SUPP: 10.0000 mg | Freq: Every day | RECTAL | Status: DC | PRN

## 2018-09-20 MED ORDER — IPRATROPIUM BROMIDE 0.02 % IN SOLN
0.5000 mg | Freq: Two times a day (BID) | RESPIRATORY_TRACT | Status: DC
  Administered 2018-09-21 (×2): 0.5 mg via RESPIRATORY_TRACT
  Filled 2018-09-20 (×2): qty 2.5

## 2018-09-20 MED ORDER — ONDANSETRON HCL 4 MG PO TABS: 4.0000 mg | ORAL_TABLET | Freq: Four times a day (QID) | ORAL | Status: DC | PRN

## 2018-09-20 MED ORDER — ASPIRIN 81 MG PO CHEW
81.0000 mg | CHEWABLE_TABLET | Freq: Every day | ORAL | Status: DC
  Administered 2018-09-20 – 2018-09-21 (×2): 81 mg via ORAL
  Filled 2018-09-20 (×3): qty 1

## 2018-09-20 MED ORDER — IPRATROPIUM-ALBUTEROL 0.5-2.5 (3) MG/3ML IN SOLN: 3.0000 mL | Freq: Three times a day (TID) | RESPIRATORY_TRACT | Status: DC | PRN

## 2018-09-20 MED ORDER — NEPRO/CARBSTEADY PO LIQD
237.0000 mL | Freq: Two times a day (BID) | ORAL | Status: DC
  Administered 2018-09-20 – 2018-09-22 (×4): 237 mL via ORAL
  Filled 2018-09-20 (×5): qty 237

## 2018-09-20 MED ORDER — HYDRALAZINE HCL 50 MG PO TABS
50.0000 mg | ORAL_TABLET | Freq: Three times a day (TID) | ORAL | Status: DC
  Administered 2018-09-20 – 2018-09-21 (×2): 50 mg via ORAL
  Filled 2018-09-20 (×5): qty 1

## 2018-09-20 MED ORDER — CHLORHEXIDINE GLUCONATE CLOTH 2 % EX PADS: 6.0000 | MEDICATED_PAD | Freq: Every day | CUTANEOUS | Status: DC

## 2018-09-20 MED ORDER — DOCUSATE SODIUM 100 MG PO CAPS
200.0000 mg | ORAL_CAPSULE | Freq: Every day | ORAL | Status: DC
  Administered 2018-09-20 – 2018-09-21 (×2): 200 mg via ORAL
  Filled 2018-09-20 (×2): qty 2

## 2018-09-20 MED ORDER — AMLODIPINE BESYLATE 10 MG PO TABS
10.0000 mg | ORAL_TABLET | Freq: Every day | ORAL | Status: DC
  Administered 2018-09-20 – 2018-09-21 (×2): 10 mg via ORAL
  Filled 2018-09-20 (×2): qty 1

## 2018-09-20 MED ORDER — GABAPENTIN 300 MG PO CAPS
300.0000 mg | ORAL_CAPSULE | Freq: Every day | ORAL | Status: DC
  Administered 2018-09-20 – 2018-09-21 (×2): 300 mg via ORAL
  Filled 2018-09-20 (×2): qty 1

## 2018-09-20 MED ORDER — CLOPIDOGREL BISULFATE 75 MG PO TABS
75.0000 mg | ORAL_TABLET | Freq: Every day | ORAL | Status: DC
  Administered 2018-09-20 – 2018-09-22 (×3): 75 mg via ORAL
  Filled 2018-09-20 (×3): qty 1

## 2018-09-20 MED ORDER — NICOTINE 14 MG/24HR TD PT24
14.0000 mg | MEDICATED_PATCH | Freq: Every day | TRANSDERMAL | Status: DC
  Administered 2018-09-20 – 2018-09-22 (×3): 14 mg via TRANSDERMAL
  Filled 2018-09-20 (×3): qty 1

## 2018-09-20 MED ORDER — FERRIC CITRATE 1 GM 210 MG(FE) PO TABS
630.0000 mg | ORAL_TABLET | Freq: Three times a day (TID) | ORAL | Status: DC
  Administered 2018-09-20 – 2018-09-22 (×6): 630 mg via ORAL
  Filled 2018-09-20 (×9): qty 3

## 2018-09-20 NOTE — Consult Note (Signed)
[facility] Renal Consultation Note    Indication for Consultation:  Management of ESRD/hemodialysis; anemia, hypertension/volume and secondary hyperparathyroidism  HPI: Trevor [name] is a 72 y.o. male.   Trevor [name] is a 72 yo AAM with PMH significant for HTN, polysubstance abuse, AAA, CVA, CHF, CAD, and ESRD who presented to [facility] ED early this morning with worsening shortness of breath.  He was hypoxic at 58% and placed on CPAP.  He has been going to HD but his edw was increased last week to 58kg because he was not getting to his previous edw of 56kg.  He went to HD on Friday and was at his edw and according to the records left 1kg above (which may have been an error).  He denies signing off early nor has he had cramps.  He also admits to not eating well and thinks he has lost some weight.    Denies any CP, N/V/D, hematochezia, melena, or BRBPR.   Past Medical History:  Diagnosis Date  . AAA (abdominal aortic aneurysm) ([place])   . Anemia   . Anxiety   . Arthritis    arms and back  . Blindness   . CHF (congestive heart failure) ([place])   . Coronary artery disease    mild   . 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 ESRD on hemodialysis ([place])    started HD 2014-15  . Headache(784.0)   . HTN (hypertension)   . Protein-calorie malnutrition ([place])   . Stroke [facility]) 01/2018   no residual deficits   Past Surgical History:  Procedure Laterality Date  . A/V FISTULAGRAM N/A 07/29/2018   Procedure: A/V FISTULAGRAM - left upper extremity;  Surgeon: Waynetta Sandy, MD;  Location: [place] CV LAB;  Service: Cardiovascular;  Laterality: N/A;  . AXILLARY-FEMORAL BYPASS GRAFT  06/28/2017   Procedure: BYPASS GRAFT RIGHT AXILLA-BIFEMORAL USING 8MM X 30CM AND 8MM X 60CM HEMASHIELD GOLD GRAFTS;  Surgeon: Rosetta Posner, MD;  Location: [place];  Service: Vascular;;  . CARDIAC CATHETERIZATION N/A 11/01/2016   Procedure: Left Heart Cath  and Coronary Angiography;  Surgeon: Lorretta Harp, MD;  Location: [place] CV LAB;  Service: Cardiovascular;  Laterality: N/A;  . ENDARTERECTOMY FEMORAL Bilateral 06/28/2017   Procedure: ENDARTERECTOMY BILATERAL FEMORAL ARTERIES;  Surgeon: Rosetta Posner, MD;  Location: [place];  Service: Vascular;  Laterality: Bilateral;  . FISTULA SUPERFICIALIZATION Left 0/08/3817   Procedure: PLICATION OF ARTERIOVENOUS FISTULA LEFT ARM;  Surgeon: Waynetta Sandy, MD;  Location: [place];  Service: Vascular;  Laterality: Left;  . FISTULOGRAM Left 05/20/2018   Procedure: FISTULOGRAM WITH BALLOON ANGIOPLASTY LEFT ARM ARTERIOVENOUS FISTULA;  Surgeon: Waynetta Sandy, MD;  Location: [place];  Service: Vascular;  Laterality: Left;  . INSERTION OF DIALYSIS CATHETER  09/21/2012   Procedure: INSERTION OF DIALYSIS CATHETER;  Surgeon: Angelia Mould, MD;  Location: [place];  Service: Vascular;  Laterality: N/A;  Right Internal Jugular Placement  . LEFT HEART CATHETERIZATION WITH CORONARY ANGIOGRAM N/A 09/28/2012   Procedure: LEFT HEART CATHETERIZATION WITH CORONARY ANGIOGRAM;  Surgeon: Sherren Mocha, MD;  Location: [facility] CATH LAB;  Service: Cardiovascular;  Laterality: N/A;  . LEFT HEART CATHETERIZATION WITH CORONARY ANGIOGRAM N/A 07/07/2014   Procedure: LEFT HEART CATHETERIZATION WITH CORONARY ANGIOGRAM;  Surgeon: Leonie Man, MD;  Location: [facility] CATH LAB;  Service: Cardiovascular;  Laterality: N/A;  . PERIPHERAL VASCULAR BALLOON ANGIOPLASTY Left 07/29/2018  Procedure: PERIPHERAL VASCULAR BALLOON ANGIOPLASTY;  Surgeon: Waynetta Sandy, MD;  Location: [place] CV LAB;  Service: Cardiovascular;  Laterality: Left;  CENTRAL VEIN  . PERIPHERAL VASCULAR INTERVENTION Left 07/29/2018   Procedure: PERIPHERAL VASCULAR INTERVENTION;  Surgeon: Waynetta Sandy, MD;  Location: [place] CV LAB;  Service: Cardiovascular;  Laterality: Left;  AXILLARY VEIN  . REVISON OF ARTERIOVENOUS FISTULA Left  08/17/2018   Procedure: REVISION OF ARTERIOVENOUS FISTULA LEFT ARM;  Surgeon: Waynetta Sandy, MD;  Location: [place];  Service: Vascular;  Laterality: Left;  . right hand    . TEE WITHOUT CARDIOVERSION  10/09/2011   Procedure: TRANSESOPHAGEAL ECHOCARDIOGRAM (TEE);  Surgeon: Lelon Perla, MD;  Location: [facility] ENDOSCOPY;  Service: Cardiovascular;  Laterality: N/A;   Family History:   Family History  Problem Relation Age of Onset  . Heart attack Mother        MI in her 29s  . Diabetes Mother   . Alcohol abuse Father   . Anesthesia problems Neg Hx   . Hypotension Neg Hx   . Malignant hyperthermia Neg Hx   . Pseudochol deficiency Neg Hx    Social History:  reports that he has been smoking cigarettes. He has a 30.00 pack-year smoking history. He has never used smokeless tobacco. He reports that he does not drink alcohol or use drugs. No Known Allergies Prior to Admission medications   Medication Sig Start Date End Date Taking? Authorizing Provider  amLODipine (NORVASC) 10 MG tablet Take 10 mg by mouth at bedtime.  04/13/17  Yes [provider]  aspirin 81 MG chewable tablet Chew 1 tablet (81 mg total) by mouth daily. 11/04/16  Yes Lyda Jester M, PA-C  atorvastatin (LIPITOR) 40 MG tablet Take 40 mg by mouth at bedtime.   Yes [provider]  bisacodyl (DULCOLAX) 10 MG suppository Place 10 mg rectally daily as needed (constipation).    Yes [provider]  cinacalcet (SENSIPAR) 90 MG tablet Take 90 mg by mouth daily with supper.   Yes [provider]  clopidogrel (PLAVIX) 75 MG tablet Take 1 tablet (75 mg total) by mouth daily. 04/05/15  Yes Barton Dubois, MD  docusate sodium (COLACE) 100 MG capsule Take 200 mg by mouth at bedtime.    Yes [provider]  ferric citrate (AURYXIA) 1 GM 210 MG(Fe) tablet Take 630 mg by mouth 3 (three) times daily with meals.    Yes [provider]  gabapentin (NEURONTIN) 300 MG capsule Take 1  capsule (300 mg total) by mouth at bedtime. 01/14/18  Yes Colbert Ewing, MD  Glycopyrrolate [facility] REFILL KIT) 25 MCG/ML SOLN Inhale 25 mcg into the lungs 2 (two) times daily.   Yes [provider]  hydrALAZINE (APRESOLINE) 50 MG tablet Take 50 mg by mouth 3 (three) times daily.   Yes [provider]  Icosapent Ethyl (VASCEPA) 1 g CAPS Take 1 g by mouth 2 (two) times daily.    Yes [provider]  ipratropium-albuterol (DUONEB) 0.5-2.5 (3) MG/3ML SOLN Take 3 mLs by nebulization every 8 (eight) hours as needed (for copd).   Yes [provider]  isosorbide mononitrate (IMDUR) 30 MG 24 hr tablet Take 30 mg by mouth daily.   Yes [provider]  Nutritional Supplements (FEEDING SUPPLEMENT, NEPRO CARB STEADY,) LIQD Take 237 mLs by mouth 2 (two) times daily between meals. 12/11/16  Yes Vann, Jessica U, DO  ondansetron (ZOFRAN) 4 MG tablet Take 4 mg by mouth every  6 (six) hours as needed for nausea or vomiting.   Yes [provider]  senna (SENOKOT) 8.6 MG TABS tablet Take 1 tablet by mouth daily.   Yes [provider]  doxercalciferol (HECTOROL) 4 MCG/2ML injection Inject 1 mL (2 mcg total) into the vein every Monday, Wednesday, and Friday with hemodialysis. Patient not taking: Reported on 09/20/2018 01/15/18   Colbert Ewing, MD  HYDROcodone-acetaminophen [facility]) 5-325 MG tablet Take 1 tablet by mouth every 6 (six) hours as needed for moderate pain. Patient not taking: Reported on 09/20/2018 08/17/18   Dagoberto Ligas, PA-C  metoprolol tartrate (LOPRESSOR) 25 MG tablet Take 1 tablet (25 mg total) by mouth 2 (two) times daily. Patient not taking: Reported on 09/20/2018 11/03/16   Consuelo Pandy, PA-C   Current Facility-Administered Medications  Medication Dose Route Frequency Provider Last Rate Last Dose  . Chlorhexidine Gluconate Cloth 2 % PADS 6 each  6 each Topical Q0600 [name], MD       Labs: Basic Metabolic  Panel: Recent Labs  Lab 09/20/18 0512  NA 136  K 7.4*  CL 98  CO2 22  GLUCOSE 99  BUN 41*  CREATININE 8.65*  CALCIUM 8.4*   Liver Function Tests: No results for input(s): AST, ALT, ALKPHOS, BILITOT, PROT, ALBUMIN in the last 168 hours. No results for input(s): LIPASE, AMYLASE in the last 168 hours. No results for input(s): AMMONIA in the last 168 hours. CBC: Recent Labs  Lab 09/20/18 0512  WBC 12.0*  NEUTROABS 10.2*  HGB 11.7*  HCT 38.0*  MCV 95.0  PLT 95*   Cardiac Enzymes: No results for input(s): CKTOTAL, CKMB, CKMBINDEX, TROPONINI in the last 168 hours. CBG: No results for input(s): GLUCAP in the last 168 hours. Iron Studies: No results for input(s): IRON, TIBC, TRANSFERRIN, FERRITIN in the last 72 hours. Studies/Results: Dg Chest Portable 1 View  Result Date: 09/20/2018 CLINICAL DATA:  Shortness of breath tonight. EXAM: PORTABLE CHEST 1 VIEW COMPARISON:  Radiograph 08/19/2018 FINDINGS: Progressive interstitial and reticular opacities in both lungs, with most significant increase at the bases. Unchanged heart size and mediastinal contours with aortic atherosclerosis. No cardiomegaly. Indistinct hemidiaphragms may represent small effusions. No pneumothorax or confluent airspace disease. Vascular stent in the left axilla. IMPRESSION: Progressive reticular interstitial opacities, suspicious for pulmonary edema. Small pleural effusions. Aortic Atherosclerosis (ICD10-I70.0). Electronically Signed   By: Keith Rake M.D.   On: 09/20/2018 05:19    ROS: Pertinent items are noted in HPI. Physical Exam: Vitals:   09/20/18 0615 09/20/18 0630 09/20/18 0640 09/20/18 0705  BP: (!) 171/57 (!) 160/63 (!) (P) 155/74   Pulse: 60 65 (P) 68 78  Resp: 15 15 (P) 16 (!) 21  Temp:   (P) 97.7 F (36.5 C)   TempSrc:   (P) Oral   SpO2: 100% 100% (P) 100% 100%  Weight:      Height:          Weight change:  No intake or output data in the 24 hours ending 09/20/18 0911 BP (!) (P)  155/74 (BP Location: Right Arm)   Pulse 78   Temp (P) 97.7 F (36.5 C) (Oral)   Resp (!) 21   Ht _0  (1.727 m)   Wt 63.5 kg   SpO2 100%   BMI 21.29 kg/m  General appearance: fatigued and mild distress Head: Normocephalic, without obvious abnormality, atraumatic Resp: rales bibasilar Cardio: regular rate and rhythm and no rub GI: soft, non-tender; bowel sounds normal; no masses,  no organomegaly Extremities: extremities normal, atraumatic, no cyanosis or edema and LU AVF +T/B Dialysis Access:  Dialysis Orders: Center: [place]  on MonWedFri, 4 hrs 0 min, 180NRe Optiflux, BFR 400, DFR Autoflow 1.5, EDW 58 (kg), Dialysate 2.0 K, 2.0 Ca, 1.0 Mg, 100 Dextrose (N2201), Sodium 137 (mEq/L), Bicarb Setting: 35 (mEq/L), UFR Profile: Profile 4, Sodium Model: None, Access: AVFistula-Transposed, Clinic 1269- Entered By ACPo...   Assessment/Plan: 1.  hypoxia and SOB- CXR consistent for pulmonary edema.  Likely related to weight loss and inadequate UF.  Pt on for urgent HD and UF.  O2 sats improving with  and is off BiPap. 2.  ESRD -  Continue with HD qMWF 3.  Hypertension/volume  -  Will need lower edw.  Cont to follow post HD 4.  Anemia  - stable on micera 50 mcg ever 2 weeks 5.  Metabolic bone disease -  Continue with binders and hectoral. 6.  Nutrition - renal diet 7. Hyperkalemia- urgent HD as above.   [name], MD [place] Pager [phone] 09/20/2018, 9:11 AM

## 2018-09-20 NOTE — Progress Notes (Signed)
FPTS Interim Progress Note  Per pharmacy, do not have Glycopyrrolate in house. Patient takes this scheduled BID. Per pharmacy recommendations, discontinued glycopyrrolate and started scheduled ipratropium nebulizer BID.  Mina Marble Joice, DO 09/20/2018, 3:27 PM PGY-1, [facility] pager [phone]

## 2018-09-20 NOTE — Progress Notes (Signed)
Patient arrived via EMS of CPAP. Placed on BIPAP per MD order. MD at bedside.

## 2018-09-20 NOTE — Progress Notes (Addendum)
FPTS Interim Progress Note  4:00PM - Received a page from nurse concerning soft BP of 105/70. Will hold hydralazine and Imdur for this dose and continue to monitor blood pressures closely.   6:15PM - BP increased to 139/80. Nurse instructed to give Hydral and Imdur now. Give Norvasc at scheduled time and call with BP this evening to determine need for evening dose of Hydral.  Danna Hefty, DO 09/20/2018, 4:00 PM PGY-1, [place] [facility] pager [phone]

## 2018-09-20 NOTE — Progress Notes (Signed)
Placed patient on BIPAP for dialysis treatment. Patient complains of SOB.

## 2018-09-20 NOTE — H&P (Signed)
[facility] Admission History and Physical Service Pager: [phone]  Patient name: Trevor [name] Medical record number: [id number] Date of birth: [birth year] Age: 72 y.o. Gender: male  Primary Care Provider: [name] Consultants: nephrology Code Status: full  Chief Complaint: SOB  Assessment and Plan: [name] is a 72 y.o. male presenting with acute respiratory failure. PMH is significant for ESRD on HD, COPD, partial blindness, CAD, CHF, hx TIA, HTN, HLD, hx cocaine abuse, tobacco abuse  Acute respiratory failure- patient hypoxic at [name] to 58%, placed on bipap in ED which was briefly removed and then reapplied during HD. Initial ABG w/ pH 7.47, pO2 64. On my exam patient is on room air with sats 93% but patient reports SOB. RT at bedside with plan to apply [place]. CXR in ED demonstrates pulmonary edema and bilateral pleural effusions. Hemoglobin stable and at baseline. Patient denied chest pain. Trop 0.05, EKG with peaked T waves and ST changes that are slightly different from prior EKGs, patient with known CAD. His lungs are clear on exam and he is breathing comfortably. Hypoxia likely due to missed HD sessions and volume overload but would like to r/o cardiac cause contributing. Doubt COPD exacerbation. -admit to SDU, attending Dr. Nori Riis -vitals per floor -continue supplemental O2, bipap as needed -monitor for respiratory improvement after HD session -trend trop -am EKG -SL nitro as needed if chest pain develops -continue home COPD meds as below  COPD w/ current Tobacco abuse- 30 pack year history w/ current tobacco use. Lungs are clear on exam, no cough or increased sputum, do not think patient is having COPD exacerbation. -nicotine patch  -continue home meds- glycopyrrolate and duonebs   ESRD on HD- Potassium 7.4 in ED. Patient taken to HD. Patient on MWF schedule, nursing home reports noncompliance w/ HD.  -Nephrology following for  HD -continue home sensipar, iron -am BMP -monitor on tele  CAD- cath 10/2016 w/ "noncritical CAD" - recommended "aggressive blood pressure control". 60% stenosis of proximal to mid cx and 1st diagonal artery.  -continue home asa, imdur, plavix  CHF- last Echo February 2019 w/ EF 45%, diffuse hypokinesis, G1DD, moderate LVH. Patient on metop tartrate 25 mg BID, does not appear to have lower extremity edema but with small bilateral pleural effusions and pulmonary edema on CXR.  -supplemental O2 as needed -repeat CXR if respiratory status worsens -reassess respiratory status after HD session -daily weights, I/Os  Anemia- hgb stable at 11.7 -continue iron -am CBC  Hx CVA- no focal deficits on exam -continue ASA, lipitor, plavix  HTN- BP elevated on admission to 160/63, anticipate this will improve w/ HD today. Patient on 10 mg norvasc and hydralazine 50 mg TID -continue home BP medications  HLD- last lipid panel 08/20/18 w/ LDL 72 -Continue statin  Hx constipation- last BM evening 10/20 -continue home dulcolax suppository as needed, colace daily, senna daily  Protein calorie malnutrition- on feeding supplement BID at [name] -continue home meal supplements  FEN/GI: NPO while on bipap, otherwise renal diet Prophylaxis: heparin ppx  Disposition: admit to step down  History of Present Illness:  Trevor [name] is a 72 y.o. male presenting with SOB from [name].  EMS called by nursing facility after patient found to be hypoxic w/ sPO2 of 58%. He was placed on CPAP en route to ED and then put on bipap. He does not wear oxygen at baseline. Nursing home reported patient noncompliant w/ HD but patient disagrees and states he  goes MWF and does not miss sessions.  Patient interviewed while getting HD. He has bipap mask on his lap. He initially denies SOB then later endorses SOB. He denies chest pain. He cannot remember what happened at nursing home that caused him to be brought here. He reports he  otherwise feels well. He has no abdominal pain. No neuro changes. He feels his appetite is poor. He does not make urine. He has regular bowel movements, last one was last night. No fevers or chills. No cough. He states his sister is his emergency contact and he wishes to be full code.   Review Of Systems: Per HPI with the following additions:   Review of Systems  Constitutional: Negative for chills and fever.  HENT: Negative for congestion.   Respiratory: Positive for shortness of breath. Negative for cough, hemoptysis and sputum production.   Cardiovascular: Negative for chest pain and leg swelling.  Gastrointestinal: Negative for constipation, diarrhea, nausea and vomiting.  Skin: Negative for rash.  Neurological: Negative for speech change and focal weakness.    Patient Active Problem List   Diagnosis Date Noted  . Pulmonary edema 09/20/2018  . Acute respiratory failure with hypoxia ([place]) 03/29/2018  . Sleep stage dysfunction 11/10/2017  . 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 Vascular device, implant, or graft complication 24/23/5361  . Peripheral arterial disease ([place]) 10/20/2017  . Non-allergic rhinitis 10/20/2017  . 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 BPH (benign prostatic hyperplasia) 07/07/2017  . At risk for adverse drug reaction 07/02/2017  . Ischemia of extremity 06/28/2017  . Acute pulmonary edema ([place]) 06/27/2017  . Hypertensive urgency 01/24/2017  . 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 Essential hypertension 03/08/2016  . Dyslipidemia 01/12/2016  . Chest pain 08/12/2015  . Accelerated hypertension 08/12/2015  . Carotid stenosis   . Abdominal aortic aneurysm ([place]) 03/06/2015  . Protein-calorie malnutrition, severe ([place]) 05/20/2014  . End-stage renal disease on hemodialysis ([place]) 05/19/2014  . Anemia of renal disease 05/19/2014  . Coronary artery disease, non-occlusive:  09/28/2012  . Secondary hyperparathyroidism ([place]) 09/27/2012  . Non compliance with  medical treatment 09/27/2012  . Acute on chronic combined systolic and diastolic CHF (congestive heart failure) ([place]) 10/10/2011  . A-fib ([place]) 10/08/2011  . History of CVA (cerebrovascular accident) 10/07/2011  . Cocaine abuse ([place]) 10/07/2011  . Tobacco use disorder 08/07/2011  . Bruit 08/07/2011    Past Medical History: Past Medical History:  Diagnosis Date  . AAA (abdominal aortic aneurysm) ([place])   . Anemia   . Anxiety   . Arthritis    arms and back  . Blindness   . CHF (congestive heart failure) ([place])   . Coronary artery disease    mild   . 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 ESRD on hemodialysis ([place])    started HD 2014-15  . Headache(784.0)   . HTN (hypertension)   . Protein-calorie malnutrition ([place])   . Stroke [facility]) 01/2018   no residual deficits    Past Surgical History: Past Surgical History:  Procedure Laterality Date  . A/V FISTULAGRAM N/A 07/29/2018   Procedure: A/V FISTULAGRAM - left upper extremity;  Surgeon: Waynetta Sandy, MD;  Location: [place] CV LAB;  Service: Cardiovascular;  Laterality: N/A;  . AXILLARY-FEMORAL BYPASS GRAFT  06/28/2017   Procedure: BYPASS GRAFT RIGHT AXILLA-BIFEMORAL USING 8MM X 30CM AND 8MM X 60CM HEMASHIELD GOLD GRAFTS;  Surgeon: Rosetta Posner, MD;  Location: [place];  Service: Vascular;;  . CARDIAC CATHETERIZATION N/A 11/01/2016   Procedure: Left Heart Cath and Coronary Angiography;  Surgeon: Lorretta Harp, MD;  Location: [place] CV [facility];  Service: Cardiovascular;  Laterality: N/A;  . ENDARTERECTOMY FEMORAL Bilateral 06/28/2017   Procedure: ENDARTERECTOMY BILATERAL FEMORAL ARTERIES;  Surgeon: Rosetta Posner, MD;  Location: [place];  Service: Vascular;  Laterality: Bilateral;  . FISTULA SUPERFICIALIZATION Left 4/85/4627   Procedure: PLICATION OF ARTERIOVENOUS FISTULA LEFT ARM;  Surgeon: Waynetta Sandy, MD;  Location: [place];  Service: Vascular;  Laterality:  Left;  . FISTULOGRAM Left 05/20/2018   Procedure: FISTULOGRAM WITH BALLOON ANGIOPLASTY LEFT ARM ARTERIOVENOUS FISTULA;  Surgeon: Waynetta Sandy, MD;  Location: [place];  Service: Vascular;  Laterality: Left;  . INSERTION OF DIALYSIS CATHETER  09/21/2012   Procedure: INSERTION OF DIALYSIS CATHETER;  Surgeon: Angelia Mould, MD;  Location: [place];  Service: Vascular;  Laterality: N/A;  Right Internal Jugular Placement  . LEFT HEART CATHETERIZATION WITH CORONARY ANGIOGRAM N/A 09/28/2012   Procedure: LEFT HEART CATHETERIZATION WITH CORONARY ANGIOGRAM;  Surgeon: Sherren Mocha, MD;  Location: [facility] CATH LAB;  Service: Cardiovascular;  Laterality: N/A;  . LEFT HEART CATHETERIZATION WITH CORONARY ANGIOGRAM N/A 07/07/2014   Procedure: LEFT HEART CATHETERIZATION WITH CORONARY ANGIOGRAM;  Surgeon: Leonie Man, MD;  Location: [facility] CATH LAB;  Service: Cardiovascular;  Laterality: N/A;  . PERIPHERAL VASCULAR BALLOON ANGIOPLASTY Left 07/29/2018   Procedure: PERIPHERAL VASCULAR BALLOON ANGIOPLASTY;  Surgeon: Waynetta Sandy, MD;  Location: [place] CV [facility];  Service: Cardiovascular;  Laterality: Left;  CENTRAL VEIN  . PERIPHERAL VASCULAR INTERVENTION Left 07/29/2018   Procedure: PERIPHERAL VASCULAR INTERVENTION;  Surgeon: Waynetta Sandy, MD;  Location: [place] CV [facility];  Service: Cardiovascular;  Laterality: Left;  AXILLARY VEIN  . REVISON OF ARTERIOVENOUS FISTULA Left 08/17/2018   Procedure: REVISION OF ARTERIOVENOUS FISTULA LEFT ARM;  Surgeon: Waynetta Sandy, MD;  Location: [place];  Service: Vascular;  Laterality: Left;  . right hand    . TEE WITHOUT CARDIOVERSION  10/09/2011   Procedure: TRANSESOPHAGEAL ECHOCARDIOGRAM (TEE);  Surgeon: Lelon Perla, MD;  Location: [facility] ENDOSCOPY;  Service: Cardiovascular;  Laterality: N/A;    Social History: Social History   Tobacco Use  . Smoking status: Current Every Day Smoker    Packs/day: 0.50    Years: 60.00    Pack  years: 30.00    Types: Cigarettes  . Smokeless tobacco: Never Used  . Tobacco comment: 5-6 cigarettes per day  Substance Use Topics  . Alcohol use: No    Alcohol/week: 0.0 standard drinks    Comment: Occasional  . Drug use: No    Frequency: 2.0 times per week    Types: "Crack" cocaine   Additional social history: resides at nursing facility  Please also refer to relevant sections of EMR.  Family History: Family History  Problem Relation Age of Onset  . Heart attack Mother        MI in her 18s  . Diabetes Mother   . Alcohol abuse Father   . Anesthesia problems Neg Hx   . Hypotension Neg Hx   . Malignant hyperthermia Neg Hx   . Pseudochol deficiency Neg Hx    Allergies and Medications: No Known Allergies No current facility-administered medications on file prior to encounter.    Current Outpatient Medications on File Prior to Encounter  Medication Sig Dispense Refill  . amLODipine (NORVASC) 10 MG tablet Take 10 mg by mouth at bedtime.  0  . aspirin 81 MG chewable tablet Chew 1 tablet (81 mg total) by mouth daily.    Marland Kitchen atorvastatin (LIPITOR) 40 MG tablet Take 40 mg by mouth at bedtime.    . bisacodyl (DULCOLAX) 10 MG suppository Place 10 mg rectally daily as needed (constipation).     . cinacalcet (SENSIPAR) 90 MG tablet Take 90 mg by mouth daily with supper.    . clopidogrel (PLAVIX) 75 MG tablet Take 1 tablet (75 mg total) by mouth daily. 30 tablet 2  . docusate sodium (COLACE) 100 MG capsule Take 200 mg by mouth at bedtime.     . ferric citrate (AURYXIA) 1 GM 210 MG(Fe) tablet Take 630 mg by mouth 3 (three) times daily with meals.     . gabapentin (NEURONTIN) 300 MG capsule Take 1 capsule (300 mg total) by mouth at bedtime. 30 capsule 0  . Glycopyrrolate (LONHALA MAGNAIR REFILL KIT) 25 MCG/ML SOLN Inhale 25 mcg into the lungs 2 (two) times daily.    . hydrALAZINE (APRESOLINE) 50 MG tablet Take 50 mg by mouth 3 (three) times daily.    Vanessa Kick Ethyl (VASCEPA) 1 g CAPS  Take 1 g by mouth 2 (two) times daily.     Marland Kitchen ipratropium-albuterol (DUONEB) 0.5-2.5 (3) MG/3ML SOLN Take 3 mLs by nebulization every 8 (eight) hours as needed (for copd).    . isosorbide mononitrate (IMDUR) 30 MG 24 hr tablet Take 30 mg by mouth daily.    . Nutritional Supplements (FEEDING SUPPLEMENT, NEPRO CARB STEADY,) LIQD Take 237 mLs by mouth 2 (two) times daily between meals.  0  . ondansetron (ZOFRAN) 4 MG tablet Take 4 mg by mouth every 6 (six) hours as needed for nausea or vomiting.    . senna (SENOKOT) 8.6 MG TABS tablet Take 1 tablet by mouth daily.    Marland Kitchen doxercalciferol (HECTOROL) 4 MCG/2ML injection Inject 1 mL (2 mcg total) into the vein every Monday, Wednesday, and Friday with hemodialysis. (Patient not taking: Reported on 09/20/2018) 2 mL   . HYDROcodone-acetaminophen (NORCO) 5-325 MG tablet Take 1 tablet by mouth every 6 (six) hours as needed for moderate pain. (Patient not taking: Reported on 09/20/2018) 12 tablet 0  . metoprolol tartrate (LOPRESSOR) 25 MG tablet Take 1 tablet (25 mg total) by mouth 2 (two) times daily. (Patient not taking: Reported on 09/20/2018) 60 tablet 5    Objective: BP (!) 160/63   Pulse 78   Temp (!) 97.3 F (36.3 C) (Axillary)   Resp (!) 21   Ht _0  (1.727 m)   Wt 63.5 kg   SpO2 100%   BMI 21.29 kg/m  Exam: General: thin elderly AA male in NAD Eyes: no conjunctival injection ENTM: dry lips but moist MM Neck: supple Cardiovascular: RRR no MRG Respiratory: CTAB. Normal work of breathing. Gastrointestinal: soft, NTND MSK: thin extremities without edema Derm: no rashes noted over chest, abdomen, extremities  Neuro: no focal deficits Psych: AO to person and place  Labs and Imaging: CBC BMET  Recent Labs  Lab 09/20/18 0512  WBC 12.0*  HGB 11.7*  HCT 38.0*  PLT 95*   Recent Labs  Lab 09/20/18 0512  NA 136  K 7.4*  CL 98  CO2 22  BUN 41*  CREATININE 8.65*  GLUCOSE 99  CALCIUM 8.4*     Dg Chest Portable 1 View  Result  Date: 09/20/2018 CLINICAL DATA:  Shortness of breath tonight. EXAM: PORTABLE CHEST 1 VIEW COMPARISON:  Radiograph 08/19/2018 FINDINGS: Progressive  interstitial and reticular opacities in both lungs, with most significant increase at the bases. Unchanged heart size and mediastinal contours with aortic atherosclerosis. No cardiomegaly. Indistinct hemidiaphragms may represent small effusions. No pneumothorax or confluent airspace disease. Vascular stent in the left axilla. IMPRESSION: Progressive reticular interstitial opacities, suspicious for pulmonary edema. Small pleural effusions. Aortic Atherosclerosis (ICD10-I70.0). Electronically Signed   By: Keith Rake M.D.   On: 09/20/2018 05:19     Steve Rattler, DO 09/20/2018, 8:14 AM PGY-3, [place] Intern pager: [phone], text pages welcome

## 2018-09-20 NOTE — ED Triage Notes (Signed)
Pt bib [facility] from [address] d/t shob.  Sats in the field were 58% on RA.  Pt placed on CPAP.  Pt is a dialysis pt, states has not missed dialysis.

## 2018-09-20 NOTE — ED Notes (Signed)
Dr. Leonides Schanz notified of pt's critical K+ of 7.4.  Report had been given to dialysis however inpatient bed has not been assigned.  Per Dr. Leonides Schanz pt needed to go to dialysis.  In order to prevent harm or cause delay in care pt transported to dialysis prior to inpatient bed being assigned.  Bed placement aware that pt is in dialysis and admitting provider will need to see pt there.

## 2018-09-20 NOTE — Procedures (Signed)
I was present at this dialysis session. I have reviewed the session itself and made appropriate changes.   Vital signs in last 24 hours:  Temp:  [97.3 F (36.3 C)-97.7 F (36.5 C)] (P) 97.7 F (36.5 C) (10/21 0640) Pulse Rate:  [60-82] 78 (10/21 0705) Resp:  [15-21] 21 (10/21 0705) BP: (137-171)/(57-86) (P) 155/74 (10/21 0640) SpO2:  [92 %-100 %] 100 % (10/21 0705) FiO2 (%):  [50 %] 50 % (10/21 0517) Weight:  [63.5 kg] 63.5 kg (10/21 0457) Weight change:  Filed Weights   09/20/18 0457  Weight: 63.5 kg    Recent Labs  Lab 09/20/18 0512  NA 136  K 7.4*  CL 98  CO2 22  GLUCOSE 99  BUN 41*  CREATININE 8.65*  CALCIUM 8.4*    Recent Labs  Lab 09/20/18 0512  WBC 12.0*  NEUTROABS 10.2*  HGB 11.7*  HCT 38.0*  MCV 95.0  PLT 95*    Scheduled Meds: . Chlorhexidine Gluconate Cloth  6 each Topical Q0600   Continuous Infusions: PRN Meds:.   [name],  MD 09/20/2018, 9:06 AM

## 2018-09-20 NOTE — Progress Notes (Signed)
Patient transported to dialysis from ED by RN. Placed on [place]. BIPAP taken to dialysis.

## 2018-09-20 NOTE — ED Provider Notes (Signed)
TIME SEEN: 4:58 AM  CHIEF COMPLAINT: Shortness of breath  HPI: Patient is a 72 year old male with history of hypertension, dyslipidemia, TIA on Plavix, CHF, end-stage renal disease on hemodialysis who presents to the emergency department shortness of breath.  History obtained mostly by EMS.  They state the patient was found to be hypoxic with oxygen saturation of 58% on room air.  He does not wear oxygen chronically.  Improved on CPAP.  Per nursing facility, EMS states that the patient has been noncompliant with dialysis but patient denies this.  He states his last dialysis was on Friday, October 18 and denies missing any dialysis.  He denies fevers, cough, chest pain.  He is a smoker.  Denies history of asthma or COPD.  Pt lives at [facility].  ROS: Level 5 caveat due to respiratory distress  PAST MEDICAL HISTORY/PAST SURGICAL HISTORY:  Past Medical History:  Diagnosis Date  . AAA (abdominal aortic aneurysm) ([place])   . Anemia   . Anxiety   . Arthritis    arms and back  . Blindness   . CHF (congestive heart failure) ([place])   . Coronary artery disease    mild   . 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 ESRD on hemodialysis ([place])    started HD 2014-15  . Headache(784.0)   . HTN (hypertension)   . Protein-calorie malnutrition ([place])   . Stroke [facility]) 01/2018   no residual deficits    MEDICATIONS:  Prior to Admission medications   Medication Sig Start Date End Date Taking? Authorizing Provider  acetaminophen (TYLENOL) 325 MG tablet Take 650 mg by mouth every 6 (six) hours as needed.    [provider]  amLODipine (NORVASC) 10 MG tablet Take 10 mg by mouth at bedtime.  04/13/17   [provider]  aspirin 81 MG chewable tablet Chew 1 tablet (81 mg total) by mouth daily. 11/04/16   Lyda Jester M, PA-C  atorvastatin (LIPITOR) 40 MG tablet Take 40 mg by mouth at bedtime.    [provider]   bisacodyl (DULCOLAX) 10 MG suppository Place 10 mg rectally daily as needed (constipation).     [provider]  cinacalcet (SENSIPAR) 90 MG tablet Take 90 mg by mouth daily with supper.    [provider]  clopidogrel (PLAVIX) 75 MG tablet Take 1 tablet (75 mg total) by mouth daily. 04/05/15   Barton Dubois, MD  docusate sodium (COLACE) 100 MG capsule Take 200 mg by mouth daily as needed for mild constipation.     [provider]  doxercalciferol (HECTOROL) 4 MCG/2ML injection Inject 1 mL (2 mcg total) into the vein every Monday, Wednesday, and Friday with hemodialysis. 01/15/18   Colbert Ewing, MD  ferric citrate (AURYXIA) 1 GM 210 MG(Fe) tablet Take 630 mg by mouth 3 (three) times daily with meals.     [provider]  gabapentin (NEURONTIN) 300 MG capsule Take 1 capsule (300 mg total) by mouth at bedtime. 01/14/18   Colbert Ewing, MD  Glycopyrrolate [facility] REFILL KIT) 25 MCG/ML SOLN Inhale 25 mcg into the lungs 2 (two) times daily.    [provider]  hydrALAZINE (APRESOLINE) 50 MG tablet Take 50 mg by mouth 3 (three) times daily.    [provider]  HYDROcodone-acetaminophen (NORCO) 5-325 MG tablet Take 1 tablet by mouth every 6 (six) hours as needed for moderate pain. 08/17/18   Dagoberto Ligas,  PA-C  Icosapent Ethyl (VASCEPA) 1 g CAPS Take 1 capsule by mouth 2 (two) times daily.    [provider]  isosorbide mononitrate (IMDUR) 30 MG 24 hr tablet Take 30 mg by mouth daily.    [provider]  metoprolol tartrate (LOPRESSOR) 25 MG tablet Take 1 tablet (25 mg total) by mouth 2 (two) times daily. 11/03/16   Lyda Jester M, PA-C  Nutritional Supplements (FEEDING SUPPLEMENT, NEPRO CARB STEADY,) LIQD Take 237 mLs by mouth 2 (two) times daily between meals. 12/11/16   Geradine Girt, DO  ondansetron (ZOFRAN) 4 MG tablet Take 4 mg by mouth every 6 (six) hours as needed for nausea or vomiting.    [provider]  senna (SENOKOT) 8.6 MG TABS tablet Take 1 tablet by mouth daily.    [provider]    ALLERGIES:  No Known Allergies  SOCIAL HISTORY:  Social History   Tobacco Use  . Smoking status: Current Every Day Smoker    Packs/day: 0.50    Years: 60.00    Pack years: 30.00    Types: Cigarettes  . Smokeless tobacco: Never Used  . Tobacco comment: 5-6 cigarettes per day  Substance Use Topics  . Alcohol use: No    Alcohol/week: 0.0 standard drinks    Comment: Occasional    FAMILY HISTORY: Family History  Problem Relation Age of Onset  . Heart attack Mother        MI in her 58s  . Diabetes Mother   . Alcohol abuse Father   . Anesthesia problems Neg Hx   . Hypotension Neg Hx   . Malignant hyperthermia Neg Hx   . Pseudochol deficiency Neg Hx     EXAM: BP (!) 166/86 (BP Location: Right Arm)   Pulse 82   Temp (!) 97.3 F (36.3 C) (Axillary)   Resp 19   Ht _0  (1.727 m)   Wt 63.5 kg   SpO2 98%   BMI 21.29 kg/m  CONSTITUTIONAL: Alert.  Can respond to questions but is in distress.  Elderly. HEAD: Normocephalic EYES: Conjunctivae clear, pupils appear equal, EOMI ENT: normal nose; moist mucous membranes NECK: Supple, no meningismus, no nuchal rigidity, no LAD  CARD: RRR; S1 and S2 appreciated; no murmurs, no clicks, no rubs, no gallops RESP: Diminished aeration diffusely, bibasilar crackles, no rhonchi or wheezing, speaking short sentences, in moderate respiratory distress, hypoxic on room air ABD/GI: Normal bowel sounds; non-distended; soft, non-tender, no rebound, no guarding, no peritoneal signs, no hepatosplenomegaly BACK:  The back appears normal and is non-tender to palpation, there is no CVA tenderness EXT: Normal ROM in all joints; non-tender to palpation; no edema; normal capillary refill; no cyanosis, no calf tenderness or swelling, left upper extremity AV fistula with normal thrill and bruit, left upper extremity as warm and well-perfused     SKIN: Normal color for age and race; warm; no rash NEURO: Moves all extremities equally PSYCH: The patient's mood and manner are appropriate. Grooming and personal hygiene are appropriate.  MEDICAL DECISION MAKING: Patient here with shortness of breath.  Has been admitted before for pulmonary edema.  Is on dialysis and he reports compliance.  Hypoxic on room air.  Denies infectious symptoms.  Denies chest pain.  EKG shows no new ischemic abnormality.  Will obtain labs, chest x-ray, ABG.  He is improving on BiPAP.  ED PROGRESS: 5:35 AM Discussed patient with Dr. Jonnie Finner with nephrology.  He will write dialysis orders for the patient this  morning.  Chest x-ray concerning for pulmonary edema.  6:35 AM  Pt has K+ of 7.4 without EKG changes.  Patient needs dialysis to correct this and is going to dialysis now.   7:20 AM Discussed patient's case with FM resident.  I have recommended admission and patient (and family if present) agree with this plan. Admitting physician will place admission orders.  They are aware that patient is currently in dialysis.  I reviewed all nursing notes, vitals, pertinent previous records, EKGs, lab and urine results, imaging (as available).     EKG Interpretation  Date/Time:  Monday September 20 2018 04:54:08 EDT Ventricular Rate:  81 PR Interval:    QRS Duration: 114 QT Interval:  424 QTC Calculation: 493 R Axis:   -8 Text Interpretation:  Sinus rhythm Probable left atrial enlargement Left ventricular hypertrophy Nonspecific T abnormalities, lateral leads Borderline prolonged QT interval St changes in anterolateral leads have improved compared to previous Confirmed by Pryor Curia [phone]) on 09/20/2018 4:58:58 AM        CRITICAL CARE Performed by: Cyril Mourning Maize Brittingham   Total critical care time: 45 minutes  Critical care time was exclusive of separately billable procedures and treating other patients.  Critical care was necessary to treat or prevent imminent  or life-threatening deterioration.  Critical care was time spent personally by me on the following activities: development of treatment plan with patient and/or surrogate as well as nursing, discussions with consultants, evaluation of patient's response to treatment, examination of patient, obtaining history from patient or surrogate, ordering and performing treatments and interventions, ordering and review of laboratory studies, ordering and review of radiographic studies, pulse oximetry and re-evaluation of patient's condition.    Allena Pietila, Delice Bison, DO 09/20/18 [phone]

## 2018-09-20 NOTE — ED Notes (Signed)
BIPAP removed and pt O2 sat 92% on 1L O2.

## 2018-09-20 NOTE — Progress Notes (Signed)
RT note-Called for patient not wanting to wear Bipap, 3L [place] placed, sp02 97%. Bipap is on standby.

## 2018-09-21 LAB — RENAL FUNCTION PANEL
ALBUMIN: 3.2 g/dL — AB (ref 3.5–5.0)
ANION GAP: 13 (ref 5–15)
BUN: 27 mg/dL — ABNORMAL HIGH (ref 8–23)
CALCIUM: 8.3 mg/dL — AB (ref 8.9–10.3)
CO2: 26 mmol/L (ref 22–32)
CREATININE: 5.56 mg/dL — AB (ref 0.61–1.24)
Chloride: 97 mmol/L — ABNORMAL LOW (ref 98–111)
GFR, EST AFRICAN AMERICAN: 11 mL/min — AB (ref >60)
GFR, EST NON AFRICAN AMERICAN: 9 mL/min — AB (ref >60)
Glucose, Bld: 89 mg/dL (ref 70–99)
PHOSPHORUS: 3.2 mg/dL (ref 2.5–4.6)
Potassium: 4.5 mmol/L (ref 3.5–5.1)
SODIUM: 136 mmol/L (ref 135–145)

## 2018-09-21 LAB — CBC
HCT: 29.3 % — ABNORMAL LOW (ref 39.0–52.0)
HEMOGLOBIN: 9.5 g/dL — AB (ref 13.0–17.0)
MCH: 29.1 pg (ref 26.0–34.0)
MCHC: 32.4 g/dL (ref 30.0–36.0)
MCV: 89.6 fL (ref 80.0–100.0)
NRBC: 0 % (ref 0.0–0.2)
PLATELETS: 167 10*3/uL (ref 150–400)
RBC: 3.27 MIL/uL — AB (ref 4.22–5.81)
RDW: 14.9 % (ref 11.5–15.5)
WBC: 6.6 10*3/uL (ref 4.0–10.5)

## 2018-09-21 LAB — TROPONIN I: Troponin I: 0.07 ng/mL (ref <0.03)

## 2018-09-21 NOTE — Discharge Summary (Addendum)
[facility] Discharge Summary  Patient name: Trevor [name] Medical record number: [id number] Date of birth: Feb 13, 1946 Age: 72 y.o. Gender: male Date of Admission: 09/20/2018  Date of Discharge: 09/22/2018 Admitting Physician: Dickie La, MD  Primary Care Provider: [name] Consultants: nephrology  Indication for Hospitalization: pulmonary edema  Discharge Diagnoses/Problem List:  Pulmonary edema COPD ESRD on HD CAD CHF Anemia H/o CVA HTN HLD Constipation Protein calorie malnutrition  Disposition: discharge back to [name]  Discharge Condition: improved/stable  Discharge Exam:  General: frail, no acute distress Heart: RRR, no murmur appreciated Pulm: CTAB, poor inhalation (short) Abd: scaphoid, non distended, non tender to palpation Extremities: atrophied legs, no swelling, moves all equally and appropriately Derm: no rashes or lesions Psych: pleasant and cooperative Neuro: alert and oriented x4  Brief Hospital Course:  Admitted for hypoxia at [name] believed to be secondary to volume overload from recent increase in estimated dry weight at dialysis. Patient was admitted and was initially on BiPAP due to hypoxia on room air. Labs were significant for elevated potassium of 7.4 without EKG changes and pulmonary edema and bilateral pleural effusions on chest x-ray. Patient received immediate dialysis due to critical potassium level. Potassium improved s/p hemodialysis. Over the course of his hospital stay, patient's respiratory status improved and patient was transitioned off of BiPAP and by discharge was stable on room air. He received his regularly scheduled HD on 10/23. His new EDW is 55.3kg. By discharge patient was hemodynamically stable on RA and afebrile. Patient to continue regularly scheduled hemodialysis.  Patient has chronic anemia. Hemoglobin dropped during admission from 11.7 to 8.3. Patient did not meet transfusion threshold  and is on chronic iron supplement but recommend monitoring hemoglobin level as outpatient.  Issues for Follow Up:  1. Follow-up hemoglobin as outpatient   Significant Procedures: Hemodialysis  Significant Labs and Imaging:  Recent Labs  Lab 09/20/18 0512 09/21/18 0219 09/22/18 0758  WBC 12.0* 6.6 5.0  HGB 11.7* 9.5* 8.3*  HCT 38.0* 29.3* 26.4*  PLT 95* 167 153   Recent Labs  Lab 09/20/18 0512  09/21/18 0219 09/22/18 0757  NA 136  --  136 137  K 7.4*   < > 4.5 4.8  CL 98  --  97* 97*  CO2 22  --  26 29  GLUCOSE 99  --  89 79  BUN 41*  --  27* 52*  CREATININE 8.65*  --  5.56* 8.02*  CALCIUM 8.4*  --  8.3* 8.1*  PHOS  --   --  3.2 2.1*  ALBUMIN  --   --  3.2* 2.8*   < > = values in this interval not displayed.    Dg Chest Portable 1 View  Result Date: 09/20/2018 CLINICAL DATA:  Shortness of breath tonight. EXAM: PORTABLE CHEST 1 VIEW COMPARISON:  Radiograph 08/19/2018 FINDINGS: Progressive interstitial and reticular opacities in both lungs, with most significant increase at the bases. Unchanged heart size and mediastinal contours with aortic atherosclerosis. No cardiomegaly. Indistinct hemidiaphragms may represent small effusions. No pneumothorax or confluent airspace disease. Vascular stent in the left axilla. IMPRESSION: Progressive reticular interstitial opacities, suspicious for pulmonary edema. Small pleural effusions. Aortic Atherosclerosis (ICD10-I70.0). Electronically Signed   By: Keith Rake M.D.   On: 09/20/2018 05:19   Results/Tests Pending at Time of Discharge: none  Discharge Medications:  Allergies as of 09/22/2018   No Known Allergies     Medication List    TAKE these medications  amLODipine 10 MG tablet Commonly known as:  NORVASC Take 10 mg by mouth at bedtime.   aspirin 81 MG chewable tablet Chew 1 tablet (81 mg total) by mouth daily.   atorvastatin 40 MG tablet Commonly known as:  LIPITOR Take 40 mg by mouth at bedtime.   bisacodyl  10 MG suppository Commonly known as:  DULCOLAX Place 10 mg rectally daily as needed (constipation).   cinacalcet 90 MG tablet Commonly known as:  SENSIPAR Take 90 mg by mouth daily with supper.   clopidogrel 75 MG tablet Commonly known as:  PLAVIX Take 1 tablet (75 mg total) by mouth daily.   docusate sodium 100 MG capsule Commonly known as:  COLACE Take 200 mg by mouth at bedtime.   doxercalciferol 4 MCG/2ML injection Commonly known as:  HECTOROL Inject 1 mL (2 mcg total) into the vein every Monday, Wednesday, and Friday with hemodialysis.   feeding supplement (NEPRO CARB STEADY) Liqd Take 237 mLs by mouth 2 (two) times daily between meals.   ferric citrate 1 GM 210 MG(Fe) tablet Commonly known as:  AURYXIA Take 630 mg by mouth 3 (three) times daily with meals.   gabapentin 300 MG capsule Commonly known as:  NEURONTIN Take 1 capsule (300 mg total) by mouth at bedtime.   hydrALAZINE 50 MG tablet Commonly known as:  APRESOLINE Take 50 mg by mouth 3 (three) times daily.   HYDROcodone-acetaminophen 5-325 MG tablet Commonly known as:  NORCO/VICODIN Take 1 tablet by mouth every 6 (six) hours as needed for moderate pain.   ipratropium-albuterol 0.5-2.5 (3) MG/3ML Soln Commonly known as:  DUONEB Take 3 mLs by nebulization every 8 (eight) hours as needed (for copd).   isosorbide mononitrate 30 MG 24 hr tablet Commonly known as:  IMDUR Take 30 mg by mouth daily.   LONHALA MAGNAIR REFILL KIT 25 MCG/ML Soln Generic drug:  Glycopyrrolate Inhale 25 mcg into the lungs 2 (two) times daily.   metoprolol tartrate 25 MG tablet Commonly known as:  LOPRESSOR Take 1 tablet (25 mg total) by mouth 2 (two) times daily.   ondansetron 4 MG tablet Commonly known as:  ZOFRAN Take 4 mg by mouth every 6 (six) hours as needed for nausea or vomiting.   senna 8.6 MG Tabs tablet Commonly known as:  SENOKOT Take 1 tablet by mouth daily.   VASCEPA 1 g Caps Generic drug:  Icosapent  Ethyl Take 1 g by mouth 2 (two) times daily.       Discharge Instructions: Please refer to Patient Instructions section of EMR for full details.  Patient was counseled important signs and symptoms that should prompt return to medical care, changes in medications, dietary instructions, activity restrictions, and follow up appointments.   Mina Marble [place], DO 09/22/2018, 2:35 PM PGY-1, [place]

## 2018-09-21 NOTE — NC FL2 (Signed)
[place] MEDICAID FL2 LEVEL OF CARE SCREENING TOOL     IDENTIFICATION  Patient Name: Trevor [name] Birthdate: 07/04/1946 Sex: male Admission Date (Current Location): 09/20/2018  [facility] and [place] Number:  Herbalist and Address:  [facility], [place] [address] [place], [place] [address]      Provider Number: [id number]  Attending Physician Name and Address:  Dickie La, MD  Relative Name and Phone Number:       Current Level of Care: Hospital Recommended Level of Care: [place] Prior Approval Number:    Date Approved/Denied:   PASRR Number:    Discharge Plan: [name]    Current Diagnoses: Patient Active Problem List   Diagnosis Date Noted  . Pulmonary edema 09/20/2018  . Acute respiratory failure with hypoxia ([place]) 03/29/2018  . ESRD on dialysis ([place]) 03/29/2018  . Sleep stage dysfunction 11/10/2017  . 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 Vascular device, implant, or graft complication 63/78/5885  . Peripheral arterial disease ([place]) 10/20/2017  . Non-allergic rhinitis 10/20/2017  . 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 BPH (benign prostatic hyperplasia) 07/07/2017  . At risk for adverse drug reaction 07/02/2017  . Ischemia of extremity 06/28/2017  . Acute pulmonary edema ([place]) 06/27/2017  . Hypertensive urgency 01/24/2017  . 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 Essential hypertension 03/08/2016  . Dyslipidemia 01/12/2016  . Chest pain 08/12/2015  . Accelerated hypertension 08/12/2015  . Carotid stenosis   . Abdominal aortic aneurysm ([place]) 03/06/2015  . Protein-calorie malnutrition, severe ([place]) 05/20/2014  . End-stage renal disease on hemodialysis ([place]) 05/19/2014  . Anemia of renal disease 05/19/2014  . Coronary artery disease, non-occlusive:  09/28/2012  . Secondary hyperparathyroidism ([place]) 09/27/2012  . Non compliance with medical treatment 09/27/2012  . Acute on chronic combined systolic and  diastolic CHF (congestive heart failure) ([place]) 10/10/2011  . A-fib ([place]) 10/08/2011  . History of CVA (cerebrovascular accident) 10/07/2011  . Cocaine abuse ([place]) 10/07/2011  . Tobacco use disorder 08/07/2011  . Bruit 08/07/2011    Orientation RESPIRATION BLADDER Height & Weight     Self, Time, Place, Situation  Normal Continent Weight: 140 lb (63.5 kg) Height:  5\' 8"  (172.7 cm)  BEHAVIORAL SYMPTOMS/MOOD NEUROLOGICAL BOWEL NUTRITION STATUS      Continent Diet(Diet renal with fluid restriction Fluid restriction: 1200 mL)  AMBULATORY STATUS COMMUNICATION OF NEEDS Skin   Limited Assist Verbally Normal                       Personal Care Assistance Level of Assistance  Dressing, Feeding, Bathing Bathing Assistance: Limited assistance Feeding assistance: Independent Dressing Assistance: Limited assistance     Functional Limitations Info  Sight, Hearing, Speech Sight Info: Adequate Hearing Info: Adequate Speech Info: Adequate    SPECIAL CARE FACTORS FREQUENCY  PT (By licensed PT), OT (By licensed OT)                    Contractures Contractures Info: Not present    Additional Factors Info  Code Status, Allergies Code Status Info: Full Code Allergies Info: No known allergies           Current Medications (09/21/2018):  This is the current hospital active medication list Current Facility-Administered Medications  Medication Dose Route Frequency Provider Last Rate Last Dose  . acetaminophen (TYLENOL) tablet 650 mg  650 mg Oral Q6H PRN Lucila Maine C, DO       Or  . acetaminophen (  TYLENOL) suppository 650 mg  650 mg Rectal Q6H PRN Lucila Maine C, DO      . amLODipine (NORVASC) tablet 10 mg  10 mg Oral QHS Riccio, Angela C, DO   10 mg at 09/20/18 2232  . aspirin chewable tablet 81 mg  81 mg Oral Daily Lucila Maine C, DO   81 mg at 09/21/18 0913  . atorvastatin (LIPITOR) tablet 40 mg  40 mg Oral QHS Riccio, Angela C, DO   40 mg at 09/20/18 2234  .  bisacodyl (DULCOLAX) suppository 10 mg  10 mg Rectal Daily PRN Lucila Maine C, DO      . cinacalcet (SENSIPAR) tablet 90 mg  90 mg Oral Q supper Lucila Maine C, DO   90 mg at 09/20/18 1643  . clopidogrel (PLAVIX) tablet 75 mg  75 mg Oral Daily Lucila Maine C, DO   75 mg at 09/21/18 0914  . docusate sodium (COLACE) capsule 200 mg  200 mg Oral QHS Riccio, Angela C, DO   200 mg at 09/20/18 2231  . feeding supplement (NEPRO CARB STEADY) liquid 237 mL  237 mL Oral BID BM Riccio, Angela C, DO   237 mL at 09/21/18 1443  . ferric citrate (AURYXIA) tablet 630 mg  630 mg Oral TID WC Riccio, Angela C, DO   630 mg at 09/21/18 1200  . gabapentin (NEURONTIN) capsule 300 mg  300 mg Oral QHS Riccio, Angela C, DO   300 mg at 09/20/18 2232  . heparin injection 5,000 Units  5,000 Units Subcutaneous Q8H Riccio, Angela C, DO   5,000 Units at 09/21/18 1440  . hydrALAZINE (APRESOLINE) tablet 50 mg  50 mg Oral TID Steve Rattler, DO   Stopped at 09/20/18 2241  . ipratropium (ATROVENT) nebulizer solution 0.5 mg  0.5 mg Nebulization BID Mullis, Kiersten P, DO   0.5 mg at 09/21/18 0817  . ipratropium-albuterol (DUONEB) 0.5-2.5 (3) MG/3ML nebulizer solution 3 mL  3 mL Nebulization Q8H PRN Riccio, Angela C, DO      . isosorbide mononitrate (IMDUR) 24 hr tablet 30 mg  30 mg Oral Daily Riccio, Angela C, DO   30 mg at 09/21/18 0914  . nicotine (NICODERM CQ - dosed in mg/24 hours) patch 14 mg  14 mg Transdermal Daily Riccio, Angela C, DO   14 mg at 09/21/18 0921  . omega-3 acid ethyl esters (LOVAZA) capsule 1 g  1 g Oral BID Riccio, Angela C, DO   1 g at 09/21/18 0913  . ondansetron (ZOFRAN) tablet 4 mg  4 mg Oral Q6H PRN Steve Rattler, DO      . senna (SENOKOT) tablet 8.6 mg  1 tablet Oral Daily Riccio, Angela C, DO   8.6 mg at 09/21/18 0912  . simethicone (MYLICON) chewable tablet 80 mg  80 mg Oral Q6H PRN Dickie La, MD   80 mg at 09/20/18 2234     Discharge Medications: Please see discharge summary for a list  of discharge medications.  Relevant Imaging Results:  Relevant Lab Results:   Additional Information [phone]. Dialysis patient MWF - [place], LCSW

## 2018-09-21 NOTE — Progress Notes (Signed)
Family Medicine Teaching Service Daily Progress Note Intern Pager: [phone]  Patient name: Trevor [name] Medical record number: [id number] Date of birth: January 09, 1946 Age: 72 y.o. Gender: male  Primary Care Provider: [name] Consultants: nephrology Code Status: full  Pt Overview and Major Events to Date:  10/21 admitted 10/21 urgent dialysis  Assessment and Plan:  Pulmonary edema- Patient was satting above 98% on 1.5L O2 this morning- removed  [place] for conversation and patient remained above 95% ORA. Continue to keep on room air unless desats again.  -vitals per floor -continue home COPD meds as below - follow with nephro for HD (plan to dialyze again tomorrow)  COPD w/ current Tobacco abuse- Lungs are clear on exam. Patient denies difficulty with breathing. Hospital does not carry glycopyrrolate. Using ipratropium instead. - nicotine patch 14mg  daily - duonebs PRN - ipratropium BID  ESRD on HD- Potassium 7.4 in ED. Patient received dialysis yesterday with great improvement in breathing status. Nephrology plans to adjust dialysis plan and keep inpatient for dialysis again tomorrow still on M,W,F schedule.  -Nephrology following for HD -continue home sensipar, iron -am BMP -monitor on tele  CAD- cath 10/2016 w/ "noncritical CAD" - recommended "aggressive blood pressure control". 60% stenosis of proximal to mid cx and 1st diagonal artery.  -continue home asa, imdur, plavix  CHF- last Echo February 2019 w/ EF 45%, diffuse hypokinesis, G1DD, moderate LVH. No lower extremity edema or crackles on lung exam. - daily weights, I/Os  Anemia- hgb decreased from 11.7 to 9.5 today -continue iron -am CBC  Hx CVA- no focal deficits on exam -continue ASA, lipitor, plavix  HTN- BP elevated on admission to 160/63. Patient on 10 mg norvasc and hydralazine 50 mg TID home. BP low overnight with lowest 91/52. No report of decreased mentation. Night dose of hydral and imdur  held until BP increased. Patient normotensive today at 123/66 -continue home BP medications  HLD- last lipid panel 08/20/18 w/ LDL 72 -Continue statin  Hx constipation- last BM evening 10/20 -continue home dulcolax suppository as needed, colace daily, senna daily  Protein calorie malnutrition- on feeding supplement BID at [name] -continue home meal supplements  FEN/GI: renal diet PPx: heparin  Disposition: med surg until after dialysis tomorrow  Subjective:  Overnight: lower blood pressures but no altered mental status and no acute events.  Today: patient states he feels well and wants to go home to his familiar environment. He is satting >95% on room air while we are speaking.  Objective: Temp:  [97.8 F (36.6 C)-99 F (37.2 C)] 99 F (37.2 C) (10/22 0828) Pulse Rate:  [69-86] 71 (10/22 0840) Resp:  [16-29] 19 (10/22 0840) BP: (90-139)/(48-80) 98/52 (10/22 0840) SpO2:  [88 %-100 %] 97 % (10/22 0840) Physical Exam: General: frail, no acute distress Cardiovascular: RRR, no murmur appreciated Respiratory: clear to auscultation bilaterally with poor ability to take deep breath Abdomen: scaphoid, non tender to palpation, no masses Extremities: no lesions or rashes, moving all equally and appropriately Neuro: alert and oriented x4  Laboratory: Recent Labs  Lab 09/20/18 0512 09/21/18 0219  WBC 12.0* 6.6  HGB 11.7* 9.5*  HCT 38.0* 29.3*  PLT 95* 167   Recent Labs  Lab 09/20/18 0512 09/20/18 0923 09/21/18 0219  NA 136  --  136  K 7.4* 3.4* 4.5  CL 98  --  97*  CO2 22  --  26  BUN 41*  --  27*  CREATININE 8.65*  --  5.56*  CALCIUM 8.4*  --  8.3*  GLUCOSE 99  --  89    Imaging/Diagnostic Tests: none  Richarda Osmond, DO 09/21/2018, 11:04 AM PGY-1, [place] Intern pager: [phone], text pages welcome

## 2018-09-21 NOTE — Progress Notes (Addendum)
Family Medicine Teaching Service Daily Progress Note Intern Pager: [phone]  Patient name: Trevor [name] Medical record number: [id number] Date of birth: [birth year] Age: 72 y.o. Gender: male  Primary Care Provider: [name] Consultants: nephrology Code Status: full  Pt Overview and Major Events to Date:  10/21 admitted 10/21 urgent dialysis 10/23 HD  Assessment and Plan: Trevor [name] is a 72 y.o. male presenting with acute respiratory failure. PMH is significant for ESRD on HD, COPD, partial blindness, CAD, CHF, hx TIA, HTN, HLD, hx cocaine abuse, tobacco abuse.  Pulmonary edema  ESRD on HD: Acute on chronic.  Improved.  Satting well on room air.  Has made significant improvement with HD as of 10/21 and 10/23. - Nephrology consulted, appreciate recommendations, plan for HD on 10/23 - Daily weights, strict I&O's - Daily RFP  COPD  tobacco use disorder: Chronic.  Lungs remain clear on exam following HD.  An everyday smoker. - Discussed smoking cessation, nicotine patch 14 mg daily - Atrovent nebulizer twice daily and DuoNeb every 8 hours as needed  CAD- cath 10/2016 w/ "noncritical CAD" - recommended "aggressive blood pressure control". 60% stenosis of proximal to mid cx and 1st diagonal artery.  -continue home asa, imdur, plavix  CHF- last Echo February 2019 w/ EF 45%, diffuse hypokinesis, G1DD, moderate LVH. No lower extremity edema or crackles on lung exam. - daily weights, I/Os  Anemia- hgb decreased from 11.7 to 9.5 today -continue iron -am CBC  Hx CVA- no focal deficits on exam -continue ASA, lipitor, plavix  HTN- BP elevated on admission to 160/63. Patient on 10 mg norvasc and hydralazine 50 mg TID home. BP low overnight with lowest 91/52. No report of decreased mentation. Night dose of hydral and imdur held until BP increased. Patient normotensive today at 123/66 -continue home BP medications  HLD- last lipid panel 08/20/18 w/ LDL 72 -Continue  statin  Hx constipation- chronic.  Controlled. -continue home dulcolax suppository as needed, colace daily, senna daily  Protein calorie malnutrition- on feeding supplement BID at [name] -continue home meal supplements  FEN/GI: renal diet PPx: heparin  Disposition: plan for HD today and further recs from nephrology, anticipate d/c to [name]  Subjective:  Patient vocalized his frustration about waking up early this morning.  Patient denied complaints of shortness of breath or chest pain.  He felt like yesterday which are much better today.  Objective: Temp:  [97.7 F (36.5 C)-99 F (37.2 C)] 98 F (36.7 C) (10/22 2313) Pulse Rate:  [71-86] 78 (10/22 2313) Resp:  [18-26] 18 (10/22 2313) BP: (90-152)/(51-73) 152/68 (10/22 2313) SpO2:  [94 %-100 %] 98 % (10/22 2313) Physical Exam: General: frail elderly male, well nourished, well developed, NAD with non-toxic appearance HEENT: normocephalic, atraumatic, moist mucous membranes Cardiovascular: regular rate and rhythm with systolic murmur best heard at right upper sternal border Lungs: clear to auscultation bilaterally with normal work of breathing on room air Abdomen: soft, non-tender, non-distended, normoactive bowel sounds Skin: warm, dry, no rashes or lesions, cap refill < 2 seconds Extremities: warm and well perfused, normal tone, no edema Neuro: grossly intact throughout moving all 4 extremities Psych: dysthymic mood, congruent affect  Laboratory: Recent Labs  Lab 09/20/18 0512 09/21/18 0219  WBC 12.0* 6.6  HGB 11.7* 9.5*  HCT 38.0* 29.3*  PLT 95* 167   Recent Labs  Lab 09/20/18 0512 09/20/18 0923 09/21/18 0219  NA 136  --  136  K 7.4* 3.4* 4.5  CL 98  --  97*  CO2 22  --  26  BUN 41*  --  27*  CREATININE 8.65*  --  5.56*  CALCIUM 8.4*  --  8.3*  GLUCOSE 99  --  89   Troponin I: 0.07 < 0.06 < 0.05  EKG 12-Lead: NSR, LVH, QTC 493, no heart block or ST changes or T wave abnormalities  PORTABLE CHEST 1 VIEW  (09/20/2018) IMPRESSION: Progressive reticular interstitial opacities, suspicious for pulmonary edema. Small pleural effusions. Aortic Atherosclerosis.  EKG 12-Lead: NSR, LVH, QTC 493, no heart block or ST changes or T wave abnormalities    Trevor Bing, DO 09/22/2018, 6:53 AM PGY-3, [place] Intern pager: [phone], text pages welcome

## 2018-09-21 NOTE — Progress Notes (Signed)
  [facility] Progress Note    Subjective:   Feels much better today   Objective:   BP (!) 98/52   Pulse 71   Temp 99 F (37.2 C) (Oral)   Resp 19   Ht 5\' 8"  (1.727 m)   Wt 63.5 kg   SpO2 97%   BMI 21.29 kg/m   Intake/Output: I/O last 3 completed shifts: In: 240 [P.O.:240] Out: 2856 [Other:2856]   Intake/Output this shift:  Total I/O In: 477 [P.O.:477] Out: -  Weight change:   Physical Exam: Gen:NAD CVS: no rub Resp: bibasilar crackles Abd: +BS, soft, NT Ext: no edema, LAVF +T/B  Labs: BMET Recent Labs  Lab 09/20/18 0512 09/20/18 0923 09/21/18 0219  NA 136  --  136  K 7.4* 3.4* 4.5  CL 98  --  97*  CO2 22  --  26  GLUCOSE 99  --  89  BUN 41*  --  27*  CREATININE 8.65*  --  5.56*  ALBUMIN  --   --  3.2*  CALCIUM 8.4*  --  8.3*  PHOS  --   --  3.2   CBC Recent Labs  Lab 09/20/18 0512 09/21/18 0219  WBC 12.0* 6.6  NEUTROABS 10.2*  --   HGB 11.7* 9.5*  HCT 38.0* 29.3*  MCV 95.0 89.6  PLT 95* 167    @IMGRELPRIORS @ Medications:    . amLODipine  10 mg Oral QHS  . aspirin  81 mg Oral Daily  . atorvastatin  40 mg Oral QHS  . cinacalcet  90 mg Oral Q supper  . clopidogrel  75 mg Oral Daily  . docusate sodium  200 mg Oral QHS  . feeding supplement (NEPRO CARB STEADY)  237 mL Oral BID BM  . ferric citrate  630 mg Oral TID WC  . gabapentin  300 mg Oral QHS  . heparin  5,000 Units Subcutaneous Q8H  . hydrALAZINE  50 mg Oral TID  . ipratropium  0.5 mg Nebulization BID  . isosorbide mononitrate  30 mg Oral Daily  . nicotine  14 mg Transdermal Daily  . omega-3 acid ethyl esters  1 g Oral BID  . senna  1 tablet Oral Daily   Dialysis Orders: Center: [facility]  on MonWedFri, 4 hrs 0 min, 180NRe Optiflux, BFR 400, DFR Autoflow 1.5, EDW 58 (kg), Dialysate 2.0 K, 2.0 Ca, 1.0 Mg, 100 Dextrose (N2201), Sodium 137 (mEq/L), Bicarb Setting: 35 (mEq/L), UFR Profile: Profile 4, Sodium Model: None, Access: AVFistula-Transposed, Clinic 1269- Entered  By ACPo...   Assessment/ Plan:   1.  hypoxia and SOB- CXR consistent for pulmonary edema.  Likely related to weight loss and inadequate UF.  Markedly improved after urgent HD and UF.  He is off of BiPap.  Will challenge edw and plan for HD again tomorrow. 2.  ESRD -  Continue with HD qMWF 3.  Hypertension/volume  -  Will need lower edw.  Cont to follow post HD 4.  Anemia  - was stable on micera 50 mcg every 2 weeks but dropped to 9.5.  Follow and transfuse prn 5.  Metabolic bone disease -  Continue with binders and hectoral. 6.  Nutrition - renal diet 7. Hyperkalemia- improved with urgent HD as above.   [name], MD [place] Pager [phone] 09/21/2018, 10:55 AM

## 2018-09-22 LAB — RENAL FUNCTION PANEL
ALBUMIN: 2.8 g/dL — AB (ref 3.5–5.0)
Anion gap: 11 (ref 5–15)
BUN: 52 mg/dL — AB (ref 8–23)
CHLORIDE: 97 mmol/L — AB (ref 98–111)
CO2: 29 mmol/L (ref 22–32)
Calcium: 8.1 mg/dL — ABNORMAL LOW (ref 8.9–10.3)
Creatinine, Ser: 8.02 mg/dL — ABNORMAL HIGH (ref 0.61–1.24)
GFR, EST AFRICAN AMERICAN: 7 mL/min — AB (ref >60)
GFR, EST NON AFRICAN AMERICAN: 6 mL/min — AB (ref >60)
Glucose, Bld: 79 mg/dL (ref 70–99)
PHOSPHORUS: 2.1 mg/dL — AB (ref 2.5–4.6)
Potassium: 4.8 mmol/L (ref 3.5–5.1)
Sodium: 137 mmol/L (ref 135–145)

## 2018-09-22 LAB — CBC
HEMATOCRIT: 26.4 % — AB (ref 39.0–52.0)
Hemoglobin: 8.3 g/dL — ABNORMAL LOW (ref 13.0–17.0)
MCH: 28.3 pg (ref 26.0–34.0)
MCHC: 31.4 g/dL (ref 30.0–36.0)
MCV: 90.1 fL (ref 80.0–100.0)
NRBC: 0 % (ref 0.0–0.2)
Platelets: 153 10*3/uL (ref 150–400)
RBC: 2.93 MIL/uL — ABNORMAL LOW (ref 4.22–5.81)
RDW: 14.6 % (ref 11.5–15.5)
WBC: 5 10*3/uL (ref 4.0–10.5)

## 2018-09-22 MED ORDER — HEPARIN SODIUM (PORCINE) 1000 UNIT/ML DIALYSIS
20.0000 [IU]/kg | INTRAMUSCULAR | Status: DC | PRN
  Administered 2018-09-22: 1300 [IU] via INTRAVENOUS_CENTRAL
  Filled 2018-09-22: qty 1.3

## 2018-09-22 MED ORDER — HEPARIN SODIUM (PORCINE) 1000 UNIT/ML IJ SOLN
INTRAMUSCULAR | Status: AC
  Administered 2018-09-22: 1300 [IU] via INTRAVENOUS_CENTRAL
  Filled 2018-09-22: qty 2

## 2018-09-22 NOTE — Clinical Social Work Note (Signed)
Clinical Social Worker facilitated patient discharge including contacting patient family and facility to confirm patient discharge plans.  Clinical information faxed to facility and family agreeable with plan.  CSW arranged ambulance transport via [facility] to [facility].  RN to call [phone] for report prior to discharge.  Clinical Social Worker will sign off for now as social work intervention is no longer needed. Please consult [place] again if new need arises.  [place], [place]

## 2018-09-22 NOTE — Clinical Social Work Note (Addendum)
Clinical Social Work Assessment  Patient Details  Name: Trevor [name] MRN: [id number] Date of Birth: 03-24-1946  Date of referral:  09/22/18               Reason for consult:  Facility Placement                Permission sought to share information with:  Family Supports Permission granted to share information::  Yes, Verbal Permission Granted  Name::     Sports administrator::  [facility]   Relationship::  Sister  Contact Information:  [phone]  Housing/Transportation Living arrangements for the past 2 months:  [place] of Information:  Patient Patient Interpreter Needed:  None Criminal Activity/Legal Involvement Pertinent to Current Situation/Hospitalization:  No - Comment as needed Significant Relationships:  Siblings Lives with:  Facility Resident Do you feel safe going back to the place where you live?  Yes Need for family participation in patient care:  No (Coment)  Care giving concerns:  Pt is alert and oriented. No family or friends present at bedside.   Social Worker assessment / plan:  CSW spoke with pt at bedside. Pt is long term care from [facility]. Pt is agreeable to return today. Pt ask that CSW let his sister know about pt d/c today. Facility is prepared to take pt today.  Employment status:  Retired Forensic scientist:  [employer] PT Recommendations:  [place], Not assessed at this time Information / Referral to community resources:  [place]  Patient/Family's Response to care:  Pt verbalized understanding of CSW role and expressed appreciation for support. Pt denies any concern regarding pt care at this time.   Patient/Family's Understanding of and Emotional Response to Diagnosis, Current Treatment, and Prognosis:  Pt understanding and realistic regarding physical limitations. Pt understands the need to return to [name] at d/c--pt agreeable. Pt's responses emotionally appropriate during conversation with CSW. Pt  denies any concern regarding treatment plan at this time. CSW will continue to provide support and facilitate d/c needs.   Emotional Assessment Appearance:  Appears stated age Attitude/Demeanor/Rapport:  (Patient was appropriate) Affect (typically observed):  Accepting, Appropriate, Calm Orientation:  Oriented to Self, Oriented to Place, Oriented to  Time, Oriented to Situation Alcohol / Substance use:  Not Applicable Psych involvement (Current and /or in the community):  No (Comment)  Discharge Needs  Concerns to be addressed:  Basic Needs Readmission within the last 30 days:    Current discharge risk:  None Barriers to Discharge:  No Barriers Identified   [place], LCSW 09/22/2018, 2:00 PM

## 2018-09-22 NOTE — Clinical Social Work Placement (Addendum)
   CLINICAL SOCIAL WORK PLACEMENT  NOTE  Date:  09/22/2018  Patient Details  Name: Trevor [name] MRN: [id number] Date of Birth: [birth year]  Clinical Social Work is seeking post-discharge placement for this patient at the [place] level of care (*CSW will initial, date and re-position this form in  chart as items are completed):      Patient/family provided with [facility] Work Department's list of facilities offering this level of care within the geographic area requested by the patient (or if unable, by the patient's family).  Yes   Patient/family informed of their freedom to choose among providers that offer the needed level of care, that participate in Medicare, Medicaid or managed care program needed by the patient, have an available bed and are willing to accept the patient.      Patient/family informed of [facility]'s ownership interest in [facility] and [facility], as well as of the fact that they are under no obligation to receive care at these facilities.  PASRR submitted to EDS on       PASRR number received on       Existing PASRR number confirmed on 09/21/18     FL2 transmitted to all facilities in geographic area requested by pt/family on       FL2 transmitted to all facilities within larger geographic area on       Patient informed that his/her managed care company has contracts with or will negotiate with certain facilities, including the following:        Yes   Patient/family informed of bed offers received.  Patient chooses bed at ([facility])     Physician recommends and patient chooses bed at      Patient to be transferred to ([facility]) on 09/22/18.  Patient to be transferred to facility by       Patient family notified on 09/22/18 of transfer.  Name of family member notified:  Pt is alert and oriented--notified and agreeable. CSW also spoke with pt's son.  PHYSICIAN        Additional Comment:    _______________________________________________ Eileen Stanford, LCSW 09/22/2018, 2:05 PM

## 2018-09-22 NOTE — Procedures (Signed)
I was present at this dialysis session. I have reviewed the session itself and made appropriate changes.   Vital signs in last 24 hours:  Temp:  [97.7 F (36.5 C)-98 F (36.7 C)] 97.9 F (36.6 C) (10/23 0730) Pulse Rate:  [61-86] 64 (10/23 0845) Resp:  [16-26] 16 (10/23 0845) BP: (72-152)/(41-73) 83/41 (10/23 0845) SpO2:  [95 %-98 %] 95 % (10/23 0730) Weight:  [55.3 kg] 55.3 kg (10/23 0730) Weight change:  Filed Weights   09/20/18 0457 09/22/18 0730  Weight: 63.5 kg 55.3 kg    Recent Labs  Lab 09/22/18 0757  NA 137  K 4.8  CL 97*  CO2 29  GLUCOSE 79  BUN 52*  CREATININE 8.02*  CALCIUM 8.1*  PHOS 2.1*    Recent Labs  Lab 09/20/18 0512 09/21/18 0219 09/22/18 0758  WBC 12.0* 6.6 5.0  NEUTROABS 10.2*  --   --   HGB 11.7* 9.5* 8.3*  HCT 38.0* 29.3* 26.4*  MCV 95.0 89.6 90.1  PLT 95* 167 153    Scheduled Meds: . amLODipine  10 mg Oral QHS  . aspirin  81 mg Oral Daily  . atorvastatin  40 mg Oral QHS  . cinacalcet  90 mg Oral Q supper  . clopidogrel  75 mg Oral Daily  . docusate sodium  200 mg Oral QHS  . feeding supplement (NEPRO CARB STEADY)  237 mL Oral BID BM  . ferric citrate  630 mg Oral TID WC  . gabapentin  300 mg Oral QHS  . heparin      . heparin  5,000 Units Subcutaneous Q8H  . hydrALAZINE  50 mg Oral TID  . ipratropium  0.5 mg Nebulization BID  . isosorbide mononitrate  30 mg Oral Daily  . nicotine  14 mg Transdermal Daily  . omega-3 acid ethyl esters  1 g Oral BID  . senna  1 tablet Oral Daily   Continuous Infusions: PRN Meds:.acetaminophen **OR** acetaminophen, bisacodyl, heparin, ipratropium-albuterol, ondansetron, simethicone    Dialysis Orders: Center:[facility] MonWedFri, 4 hrs 0 min, 180NRe Optiflux, BFR 400, DFR Autoflow 1.5, EDW 58 (kg), Dialysate 2.0 K, 2.0 Ca, 1.0 Mg, 100 Dextrose (N2201), Sodium 137 (mEq/L), Bicarb Setting: 35 (mEq/L), UFR Profile: Profile 4, Sodium Model: None, Access: AVFistula-Transposed, Clinic 1269- Entered By  ACPo...   Assessment/ Plan:   1. hypoxia and SOB- CXR consistent for pulmonary edema. Likely related to weight loss and inadequate UF. Markedly improved after urgent HD and UF. He is off of BiPap.  His weight was 63.5kg prirot to HD on 09/20/18 and is now 55.3 kg (but these were weights on the bed scale).  Goal today was 3-4 liters but his blood pressure has dropped.  Will try to weight post HD today with standing weight and get accurate new edw.   2. ESRD- Continue with HD qMWF 3. Hypertension/volume- Will need lower edw. Cont to follow post HD 4. Anemia- was stable on micera 50 mcg every 2 weeks but dropped to 9.5.  Follow and transfuse prn 5. Metabolic bone disease- Continue with binders and hectoral. 6. Nutrition- renal diet 7. Hyperkalemia- improved with urgent HD as above.   [name],  MD 09/22/2018, 8:57 AM

## 2018-09-22 NOTE — Clinical Social Work Note (Signed)
Pt is aware of d/c back to [facility] today. Facility is ready for pt at this time. RN updated.    [place], [place]

## 2018-09-23 DIAGNOSIS — N186 End stage renal disease: Secondary | ICD-10-CM | POA: Diagnosis not present

## 2018-09-23 DIAGNOSIS — J9601 Acute respiratory failure with hypoxia: Secondary | ICD-10-CM | POA: Diagnosis not present

## 2018-09-24 DIAGNOSIS — D649 Anemia, unspecified: Secondary | ICD-10-CM | POA: Diagnosis not present

## 2018-09-24 DIAGNOSIS — N186 End stage renal disease: Secondary | ICD-10-CM | POA: Diagnosis not present

## 2018-09-24 DIAGNOSIS — F4320 Adjustment disorder, unspecified: Secondary | ICD-10-CM | POA: Diagnosis not present

## 2018-09-27 DIAGNOSIS — N186 End stage renal disease: Secondary | ICD-10-CM | POA: Diagnosis not present

## 2018-09-27 DIAGNOSIS — I429 Cardiomyopathy, unspecified: Secondary | ICD-10-CM | POA: Diagnosis not present

## 2018-09-27 DIAGNOSIS — I10 Essential (primary) hypertension: Secondary | ICD-10-CM | POA: Diagnosis not present

## 2018-09-27 DIAGNOSIS — D631 Anemia in chronic kidney disease: Secondary | ICD-10-CM | POA: Diagnosis not present

## 2018-09-28 DIAGNOSIS — F4321 Adjustment disorder with depressed mood: Secondary | ICD-10-CM | POA: Diagnosis not present

## 2018-09-30 DIAGNOSIS — N186 End stage renal disease: Secondary | ICD-10-CM | POA: Diagnosis not present

## 2018-09-30 DIAGNOSIS — J810 Acute pulmonary edema: Secondary | ICD-10-CM | POA: Diagnosis not present

## 2018-09-30 DIAGNOSIS — R2689 Other abnormalities of gait and mobility: Secondary | ICD-10-CM | POA: Diagnosis not present

## 2018-09-30 DIAGNOSIS — M6281 Muscle weakness (generalized): Secondary | ICD-10-CM | POA: Diagnosis not present

## 2018-09-30 DIAGNOSIS — I429 Cardiomyopathy, unspecified: Secondary | ICD-10-CM | POA: Diagnosis not present

## 2018-10-04 DIAGNOSIS — M6281 Muscle weakness (generalized): Secondary | ICD-10-CM | POA: Diagnosis not present

## 2018-10-04 DIAGNOSIS — R42 Dizziness and giddiness: Secondary | ICD-10-CM | POA: Diagnosis not present

## 2018-10-04 DIAGNOSIS — J810 Acute pulmonary edema: Secondary | ICD-10-CM | POA: Diagnosis not present

## 2018-10-04 DIAGNOSIS — I872 Venous insufficiency (chronic) (peripheral): Secondary | ICD-10-CM | POA: Diagnosis not present

## 2018-10-04 DIAGNOSIS — N186 End stage renal disease: Secondary | ICD-10-CM | POA: Diagnosis not present

## 2018-10-04 DIAGNOSIS — R2689 Other abnormalities of gait and mobility: Secondary | ICD-10-CM | POA: Diagnosis not present

## 2018-10-04 DIAGNOSIS — F0390 Unspecified dementia without behavioral disturbance: Secondary | ICD-10-CM | POA: Diagnosis not present

## 2018-10-04 DIAGNOSIS — I429 Cardiomyopathy, unspecified: Secondary | ICD-10-CM | POA: Diagnosis not present

## 2018-10-05 DIAGNOSIS — I872 Venous insufficiency (chronic) (peripheral): Secondary | ICD-10-CM | POA: Diagnosis not present

## 2018-10-05 DIAGNOSIS — M6281 Muscle weakness (generalized): Secondary | ICD-10-CM | POA: Diagnosis not present

## 2018-10-05 DIAGNOSIS — R2689 Other abnormalities of gait and mobility: Secondary | ICD-10-CM | POA: Diagnosis not present

## 2018-10-05 DIAGNOSIS — I429 Cardiomyopathy, unspecified: Secondary | ICD-10-CM | POA: Diagnosis not present

## 2018-10-05 DIAGNOSIS — F0390 Unspecified dementia without behavioral disturbance: Secondary | ICD-10-CM | POA: Diagnosis not present

## 2018-10-05 DIAGNOSIS — F4321 Adjustment disorder with depressed mood: Secondary | ICD-10-CM | POA: Diagnosis not present

## 2018-10-05 DIAGNOSIS — J810 Acute pulmonary edema: Secondary | ICD-10-CM | POA: Diagnosis not present

## 2018-10-07 DIAGNOSIS — I872 Venous insufficiency (chronic) (peripheral): Secondary | ICD-10-CM | POA: Diagnosis not present

## 2018-10-07 DIAGNOSIS — F0390 Unspecified dementia without behavioral disturbance: Secondary | ICD-10-CM | POA: Diagnosis not present

## 2018-10-07 DIAGNOSIS — M6281 Muscle weakness (generalized): Secondary | ICD-10-CM | POA: Diagnosis not present

## 2018-10-07 DIAGNOSIS — J810 Acute pulmonary edema: Secondary | ICD-10-CM | POA: Diagnosis not present

## 2018-10-07 DIAGNOSIS — R2689 Other abnormalities of gait and mobility: Secondary | ICD-10-CM | POA: Diagnosis not present

## 2018-10-07 DIAGNOSIS — I429 Cardiomyopathy, unspecified: Secondary | ICD-10-CM | POA: Diagnosis not present

## 2018-10-09 DIAGNOSIS — I429 Cardiomyopathy, unspecified: Secondary | ICD-10-CM | POA: Diagnosis not present

## 2018-10-09 DIAGNOSIS — M6281 Muscle weakness (generalized): Secondary | ICD-10-CM | POA: Diagnosis not present

## 2018-10-09 DIAGNOSIS — I872 Venous insufficiency (chronic) (peripheral): Secondary | ICD-10-CM | POA: Diagnosis not present

## 2018-10-09 DIAGNOSIS — F0390 Unspecified dementia without behavioral disturbance: Secondary | ICD-10-CM | POA: Diagnosis not present

## 2018-10-09 DIAGNOSIS — J810 Acute pulmonary edema: Secondary | ICD-10-CM | POA: Diagnosis not present

## 2018-10-09 DIAGNOSIS — R2689 Other abnormalities of gait and mobility: Secondary | ICD-10-CM | POA: Diagnosis not present

## 2018-10-12 DIAGNOSIS — J810 Acute pulmonary edema: Secondary | ICD-10-CM | POA: Diagnosis not present

## 2018-10-12 DIAGNOSIS — F0390 Unspecified dementia without behavioral disturbance: Secondary | ICD-10-CM | POA: Diagnosis not present

## 2018-10-12 DIAGNOSIS — R2689 Other abnormalities of gait and mobility: Secondary | ICD-10-CM | POA: Diagnosis not present

## 2018-10-12 DIAGNOSIS — I429 Cardiomyopathy, unspecified: Secondary | ICD-10-CM | POA: Diagnosis not present

## 2018-10-12 DIAGNOSIS — M6281 Muscle weakness (generalized): Secondary | ICD-10-CM | POA: Diagnosis not present

## 2018-10-12 DIAGNOSIS — I872 Venous insufficiency (chronic) (peripheral): Secondary | ICD-10-CM | POA: Diagnosis not present

## 2018-10-21 DIAGNOSIS — I10 Essential (primary) hypertension: Secondary | ICD-10-CM | POA: Diagnosis not present

## 2018-10-21 DIAGNOSIS — I639 Cerebral infarction, unspecified: Secondary | ICD-10-CM | POA: Diagnosis not present

## 2018-10-21 DIAGNOSIS — N186 End stage renal disease: Secondary | ICD-10-CM | POA: Diagnosis not present

## 2018-10-21 DIAGNOSIS — F4321 Adjustment disorder with depressed mood: Secondary | ICD-10-CM | POA: Diagnosis not present

## 2018-10-21 DIAGNOSIS — I429 Cardiomyopathy, unspecified: Secondary | ICD-10-CM | POA: Diagnosis not present

## 2018-10-22 DIAGNOSIS — F4320 Adjustment disorder, unspecified: Secondary | ICD-10-CM | POA: Diagnosis not present

## 2018-11-02 DIAGNOSIS — I739 Peripheral vascular disease, unspecified: Secondary | ICD-10-CM | POA: Diagnosis not present

## 2018-11-02 DIAGNOSIS — L600 Ingrowing nail: Secondary | ICD-10-CM | POA: Diagnosis not present

## 2018-11-02 DIAGNOSIS — I639 Cerebral infarction, unspecified: Secondary | ICD-10-CM | POA: Diagnosis not present

## 2018-11-02 DIAGNOSIS — R600 Localized edema: Secondary | ICD-10-CM | POA: Diagnosis not present

## 2018-11-02 DIAGNOSIS — D631 Anemia in chronic kidney disease: Secondary | ICD-10-CM | POA: Diagnosis not present

## 2018-11-02 DIAGNOSIS — B351 Tinea unguium: Secondary | ICD-10-CM | POA: Diagnosis not present

## 2018-11-02 DIAGNOSIS — Q845 Enlarged and hypertrophic nails: Secondary | ICD-10-CM | POA: Diagnosis not present

## 2018-11-02 DIAGNOSIS — N186 End stage renal disease: Secondary | ICD-10-CM | POA: Diagnosis not present

## 2018-11-02 DIAGNOSIS — I429 Cardiomyopathy, unspecified: Secondary | ICD-10-CM | POA: Diagnosis not present

## 2018-11-02 DIAGNOSIS — L603 Nail dystrophy: Secondary | ICD-10-CM | POA: Diagnosis not present

## 2018-11-04 DIAGNOSIS — F4321 Adjustment disorder with depressed mood: Secondary | ICD-10-CM | POA: Diagnosis not present

## 2018-11-11 ENCOUNTER — Emergency Department (HOSPITAL_COMMUNITY): Payer: Medicare Other

## 2018-11-11 ENCOUNTER — Inpatient Hospital Stay (HOSPITAL_COMMUNITY): Payer: Medicare Other

## 2018-11-11 ENCOUNTER — Encounter (HOSPITAL_COMMUNITY)
Admission: EM | Disposition: A | Discharge status: Skilled Nursing Facility | Payer: Self-pay | Source: Skilled Nursing Facility | Attending: Cardiovascular Disease

## 2018-11-11 ENCOUNTER — Encounter (HOSPITAL_COMMUNITY): Payer: Self-pay | Admitting: *Deleted

## 2018-11-11 ENCOUNTER — Inpatient Hospital Stay (HOSPITAL_COMMUNITY)
Admission: EM | Admit: 2018-11-11 | Discharge: 2018-11-15 | DRG: 280 | Disposition: A | Discharge status: Skilled Nursing Facility | Payer: Medicare Other | Source: Skilled Nursing Facility | Attending: Cardiovascular Disease | Admitting: Cardiovascular Disease

## 2018-11-11 ENCOUNTER — Other Ambulatory Visit: Payer: Self-pay

## 2018-11-11 DIAGNOSIS — I959 Hypotension, unspecified: Secondary | ICD-10-CM | POA: Diagnosis present

## 2018-11-11 DIAGNOSIS — I5043 Acute on chronic combined systolic (congestive) and diastolic (congestive) heart failure: Secondary | ICD-10-CM | POA: Diagnosis not present

## 2018-11-11 DIAGNOSIS — N186 End stage renal disease: Secondary | ICD-10-CM | POA: Diagnosis not present

## 2018-11-11 DIAGNOSIS — I214 Non-ST elevation (NSTEMI) myocardial infarction: Principal | ICD-10-CM | POA: Diagnosis present

## 2018-11-11 DIAGNOSIS — I213 ST elevation (STEMI) myocardial infarction of unspecified site: Secondary | ICD-10-CM | POA: Diagnosis not present

## 2018-11-11 DIAGNOSIS — Z7401 Bed confinement status: Secondary | ICD-10-CM | POA: Diagnosis not present

## 2018-11-11 DIAGNOSIS — Z7902 Long term (current) use of antithrombotics/antiplatelets: Secondary | ICD-10-CM | POA: Diagnosis not present

## 2018-11-11 DIAGNOSIS — F419 Anxiety disorder, unspecified: Secondary | ICD-10-CM | POA: Diagnosis present

## 2018-11-11 DIAGNOSIS — I110 Hypertensive heart disease with heart failure: Secondary | ICD-10-CM | POA: Diagnosis present

## 2018-11-11 DIAGNOSIS — R2689 Other abnormalities of gait and mobility: Secondary | ICD-10-CM | POA: Diagnosis present

## 2018-11-11 DIAGNOSIS — H547 Unspecified visual loss: Secondary | ICD-10-CM | POA: Diagnosis present

## 2018-11-11 DIAGNOSIS — I209 Angina pectoris, unspecified: Secondary | ICD-10-CM | POA: Diagnosis not present

## 2018-11-11 DIAGNOSIS — R41841 Cognitive communication deficit: Secondary | ICD-10-CM | POA: Diagnosis present

## 2018-11-11 DIAGNOSIS — I714 Abdominal aortic aneurysm, without rupture, unspecified: Secondary | ICD-10-CM | POA: Diagnosis present

## 2018-11-11 DIAGNOSIS — I252 Old myocardial infarction: Secondary | ICD-10-CM | POA: Diagnosis present

## 2018-11-11 DIAGNOSIS — I120 Hypertensive chronic kidney disease with stage 5 chronic kidney disease or end stage renal disease: Secondary | ICD-10-CM | POA: Diagnosis not present

## 2018-11-11 DIAGNOSIS — Z811 Family history of alcohol abuse and dependence: Secondary | ICD-10-CM

## 2018-11-11 DIAGNOSIS — D631 Anemia in chronic kidney disease: Secondary | ICD-10-CM | POA: Diagnosis present

## 2018-11-11 DIAGNOSIS — I2510 Atherosclerotic heart disease of native coronary artery without angina pectoris: Secondary | ICD-10-CM | POA: Diagnosis present

## 2018-11-11 DIAGNOSIS — Z833 Family history of diabetes mellitus: Secondary | ICD-10-CM | POA: Diagnosis not present

## 2018-11-11 DIAGNOSIS — Z8249 Family history of ischemic heart disease and other diseases of the circulatory system: Secondary | ICD-10-CM | POA: Diagnosis not present

## 2018-11-11 DIAGNOSIS — N2581 Secondary hyperparathyroidism of renal origin: Secondary | ICD-10-CM | POA: Diagnosis not present

## 2018-11-11 DIAGNOSIS — M2550 Pain in unspecified joint: Secondary | ICD-10-CM | POA: Diagnosis not present

## 2018-11-11 DIAGNOSIS — E785 Hyperlipidemia, unspecified: Secondary | ICD-10-CM | POA: Diagnosis not present

## 2018-11-11 DIAGNOSIS — F17210 Nicotine dependence, cigarettes, uncomplicated: Secondary | ICD-10-CM | POA: Diagnosis present

## 2018-11-11 DIAGNOSIS — Z9862 Peripheral vascular angioplasty status: Secondary | ICD-10-CM | POA: Diagnosis not present

## 2018-11-11 DIAGNOSIS — I5042 Chronic combined systolic (congestive) and diastolic (congestive) heart failure: Secondary | ICD-10-CM | POA: Diagnosis present

## 2018-11-11 DIAGNOSIS — T829XXA Unspecified complication of cardiac and vascular prosthetic device, implant and graft, initial encounter: Secondary | ICD-10-CM | POA: Diagnosis present

## 2018-11-11 DIAGNOSIS — R0789 Other chest pain: Secondary | ICD-10-CM | POA: Diagnosis not present

## 2018-11-11 DIAGNOSIS — Z79899 Other long term (current) drug therapy: Secondary | ICD-10-CM | POA: Diagnosis not present

## 2018-11-11 DIAGNOSIS — M79672 Pain in left foot: Secondary | ICD-10-CM | POA: Diagnosis not present

## 2018-11-11 DIAGNOSIS — I132 Hypertensive heart and chronic kidney disease with heart failure and with stage 5 chronic kidney disease, or end stage renal disease: Secondary | ICD-10-CM | POA: Diagnosis not present

## 2018-11-11 DIAGNOSIS — Z7982 Long term (current) use of aspirin: Secondary | ICD-10-CM

## 2018-11-11 DIAGNOSIS — I70208 Unspecified atherosclerosis of native arteries of extremities, other extremity: Secondary | ICD-10-CM | POA: Diagnosis present

## 2018-11-11 DIAGNOSIS — R079 Chest pain, unspecified: Secondary | ICD-10-CM | POA: Diagnosis not present

## 2018-11-11 DIAGNOSIS — G629 Polyneuropathy, unspecified: Secondary | ICD-10-CM | POA: Diagnosis present

## 2018-11-11 DIAGNOSIS — I10 Essential (primary) hypertension: Secondary | ICD-10-CM | POA: Diagnosis present

## 2018-11-11 DIAGNOSIS — R0602 Shortness of breath: Secondary | ICD-10-CM | POA: Diagnosis not present

## 2018-11-11 DIAGNOSIS — I70303 Unspecified atherosclerosis of unspecified type of bypass graft(s) of the extremities, bilateral legs: Secondary | ICD-10-CM | POA: Diagnosis present

## 2018-11-11 DIAGNOSIS — M6281 Muscle weakness (generalized): Secondary | ICD-10-CM | POA: Diagnosis present

## 2018-11-11 DIAGNOSIS — Z992 Dependence on renal dialysis: Secondary | ICD-10-CM | POA: Diagnosis not present

## 2018-11-11 DIAGNOSIS — R000 Tachycardia, unspecified: Secondary | ICD-10-CM | POA: Diagnosis not present

## 2018-11-11 DIAGNOSIS — Z8673 Personal history of transient ischemic attack (TIA), and cerebral infarction without residual deficits: Secondary | ICD-10-CM | POA: Diagnosis not present

## 2018-11-11 DIAGNOSIS — G459 Transient cerebral ischemic attack, unspecified: Secondary | ICD-10-CM | POA: Diagnosis not present

## 2018-11-11 HISTORY — DX: ST elevation (STEMI) myocardial infarction of unspecified site: I21.3

## 2018-11-11 HISTORY — PX: LEFT HEART CATH: CATH118248

## 2018-11-11 LAB — CBC WITH DIFFERENTIAL/PLATELET
Abs Immature Granulocytes: 0.03 10*3/uL (ref 0.00–0.07)
BASOS PCT: 2 %
Basophils Absolute: 0.1 10*3/uL (ref 0.0–0.1)
EOS ABS: 0.4 10*3/uL (ref 0.0–0.5)
Eosinophils Relative: 6 %
HCT: 44.4 % (ref 39.0–52.0)
Hemoglobin: 14.2 g/dL (ref 13.0–17.0)
Immature Granulocytes: 1 %
LYMPHS PCT: 28 %
Lymphs Abs: 1.8 10*3/uL (ref 0.7–4.0)
MCH: 28.6 pg (ref 26.0–34.0)
MCHC: 32 g/dL (ref 30.0–36.0)
MCV: 89.5 fL (ref 80.0–100.0)
Monocytes Absolute: 0.6 10*3/uL (ref 0.1–1.0)
Monocytes Relative: 9 %
Neutro Abs: 3.6 10*3/uL (ref 1.7–7.7)
Neutrophils Relative %: 54 %
Platelets: 164 10*3/uL (ref 150–400)
RBC: 4.96 MIL/uL (ref 4.22–5.81)
RDW: 14.4 % (ref 11.5–15.5)
WBC: 6.6 10*3/uL (ref 4.0–10.5)
nRBC: 0 % (ref 0.0–0.2)

## 2018-11-11 LAB — COMPREHENSIVE METABOLIC PANEL
ALT: 12 U/L (ref 0–44)
AST: 26 U/L (ref 15–41)
Albumin: 3.6 g/dL (ref 3.5–5.0)
Alkaline Phosphatase: 93 U/L (ref 38–126)
Anion gap: 20 — ABNORMAL HIGH (ref 5–15)
BUN: 20 mg/dL (ref 8–23)
CO2: 26 mmol/L (ref 22–32)
Calcium: 9.6 mg/dL (ref 8.9–10.3)
Chloride: 91 mmol/L — ABNORMAL LOW (ref 98–111)
Creatinine, Ser: 5.82 mg/dL — ABNORMAL HIGH (ref 0.61–1.24)
GFR calc non Af Amer: 9 mL/min — ABNORMAL LOW (ref >60)
GFR, EST AFRICAN AMERICAN: 10 mL/min — AB (ref >60)
GLUCOSE: 65 mg/dL — AB (ref 70–99)
Potassium: 6.2 mmol/L — ABNORMAL HIGH (ref 3.5–5.1)
Sodium: 137 mmol/L (ref 135–145)
Total Bilirubin: 1.2 mg/dL (ref 0.3–1.2)
Total Protein: 7.9 g/dL (ref 6.5–8.1)

## 2018-11-11 LAB — POCT I-STAT, CHEM 8
BUN: 20 mg/dL (ref 8–23)
CHLORIDE: 97 mmol/L — AB (ref 98–111)
CREATININE: 5.2 mg/dL — AB (ref 0.61–1.24)
Calcium, Ion: 1.02 mmol/L — ABNORMAL LOW (ref 1.15–1.40)
Glucose, Bld: 78 mg/dL (ref 70–99)
HCT: 40 % (ref 39.0–52.0)
HEMOGLOBIN: 13.6 g/dL (ref 13.0–17.0)
POTASSIUM: 4.7 mmol/L (ref 3.5–5.1)
Sodium: 138 mmol/L (ref 135–145)
TCO2: 30 mmol/L (ref 22–32)

## 2018-11-11 LAB — MRSA PCR SCREENING: MRSA by PCR: NEGATIVE

## 2018-11-11 LAB — TROPONIN I
Troponin I: 0.04 ng/mL (ref <0.03)
Troponin I: 2.71 ng/mL (ref <0.03)
Troponin I: 8.21 ng/mL (ref <0.03)

## 2018-11-11 LAB — APTT: aPTT: 33 seconds (ref 24–36)

## 2018-11-11 LAB — PROTIME-INR
INR: 1.04
Prothrombin Time: 13.5 seconds (ref 11.4–15.2)

## 2018-11-11 LAB — LIPID PANEL
Cholesterol: 109 mg/dL (ref 0–200)
HDL: 32 mg/dL — ABNORMAL LOW (ref >40)
LDL CALC: 55 mg/dL (ref 0–99)
TRIGLYCERIDES: 108 mg/dL (ref <150)
Total CHOL/HDL Ratio: 3.4 RATIO
VLDL: 22 mg/dL (ref 0–40)

## 2018-11-11 LAB — HEMOGLOBIN A1C
Hgb A1c MFr Bld: 4.5 % — ABNORMAL LOW (ref 4.8–5.6)
Mean Plasma Glucose: 82.45 mg/dL

## 2018-11-11 LAB — TSH: TSH: 1.331 u[IU]/mL (ref 0.350–4.500)

## 2018-11-11 SURGERY — LEFT HEART CATH
Anesthesia: LOCAL

## 2018-11-11 MED ORDER — HEPARIN (PORCINE) IN NACL 1000-0.9 UT/500ML-% IV SOLN
INTRAVENOUS | Status: AC
  Filled 2018-11-11: qty 1500

## 2018-11-11 MED ORDER — NITROGLYCERIN IN D5W 200-5 MCG/ML-% IV SOLN
0.0000 ug/min | INTRAVENOUS | Status: DC
  Administered 2018-11-11: 10 ug/min via INTRAVENOUS

## 2018-11-11 MED ORDER — ACETAMINOPHEN 325 MG PO TABS: 650.0000 mg | ORAL_TABLET | ORAL | Status: DC | PRN

## 2018-11-11 MED ORDER — SODIUM CHLORIDE 0.9% FLUSH
3.0000 mL | Freq: Two times a day (BID) | INTRAVENOUS | Status: DC
  Administered 2018-11-13 – 2018-11-15 (×6): 3 mL via INTRAVENOUS

## 2018-11-11 MED ORDER — CHLORHEXIDINE GLUCONATE CLOTH 2 % EX PADS
6.0000 | MEDICATED_PAD | Freq: Every day | CUTANEOUS | Status: DC
  Administered 2018-11-12: 6 via TOPICAL

## 2018-11-11 MED ORDER — IOHEXOL 350 MG/ML SOLN
INTRAVENOUS | Status: DC | PRN
  Administered 2018-11-11: 20 mL via INTRA_ARTERIAL

## 2018-11-11 MED ORDER — NITROGLYCERIN IN D5W 200-5 MCG/ML-% IV SOLN
INTRAVENOUS | Status: AC
  Filled 2018-11-11: qty 250

## 2018-11-11 MED ORDER — NITROGLYCERIN 0.4 MG SL SUBL: 0.4000 mg | SUBLINGUAL_TABLET | SUBLINGUAL | Status: DC | PRN

## 2018-11-11 MED ORDER — ONDANSETRON HCL 4 MG/2ML IJ SOLN: 4.0000 mg | Freq: Four times a day (QID) | INTRAMUSCULAR | Status: DC | PRN

## 2018-11-11 MED ORDER — HEPARIN (PORCINE) IN NACL 1000-0.9 UT/500ML-% IV SOLN
INTRAVENOUS | Status: DC | PRN
  Administered 2018-11-11 (×2): 500 mL

## 2018-11-11 MED ORDER — OXYCODONE HCL 5 MG PO TABS
5.0000 mg | ORAL_TABLET | ORAL | Status: DC | PRN
  Administered 2018-11-11 – 2018-11-13 (×5): 10 mg via ORAL
  Administered 2018-11-14: 5 mg via ORAL
  Filled 2018-11-11 (×5): qty 2
  Filled 2018-11-11: qty 1

## 2018-11-11 MED ORDER — VERAPAMIL HCL 2.5 MG/ML IV SOLN
INTRAVENOUS | Status: AC
  Filled 2018-11-11: qty 2

## 2018-11-11 MED ORDER — HEPARIN SODIUM (PORCINE) 5000 UNIT/ML IJ SOLN
4000.0000 [IU] | Freq: Once | INTRAMUSCULAR | Status: AC
  Administered 2018-11-11: 4000 [IU] via INTRAVENOUS
  Filled 2018-11-11: qty 1

## 2018-11-11 MED ORDER — CLOPIDOGREL BISULFATE 75 MG PO TABS
75.0000 mg | ORAL_TABLET | Freq: Every day | ORAL | Status: DC
  Administered 2018-11-12 – 2018-11-15 (×4): 75 mg via ORAL
  Filled 2018-11-11 (×4): qty 1

## 2018-11-11 MED ORDER — SODIUM CHLORIDE 0.9% FLUSH: 3.0000 mL | INTRAVENOUS | Status: DC | PRN

## 2018-11-11 MED ORDER — ZOLPIDEM TARTRATE 5 MG PO TABS
5.0000 mg | ORAL_TABLET | Freq: Every evening | ORAL | Status: DC | PRN
  Administered 2018-11-12: 5 mg via ORAL
  Filled 2018-11-11: qty 1

## 2018-11-11 MED ORDER — ATORVASTATIN CALCIUM 80 MG PO TABS
80.0000 mg | ORAL_TABLET | Freq: Every day | ORAL | Status: DC
  Administered 2018-11-11 – 2018-11-14 (×3): 80 mg via ORAL
  Filled 2018-11-11 (×3): qty 1

## 2018-11-11 MED ORDER — HEPARIN (PORCINE) 25000 UT/250ML-% IV SOLN
850.0000 [IU]/h | INTRAVENOUS | Status: DC
  Administered 2018-11-11: 650 [IU]/h via INTRAVENOUS
  Administered 2018-11-12: 850 [IU]/h via INTRAVENOUS
  Filled 2018-11-11 (×2): qty 250

## 2018-11-11 MED ORDER — SODIUM CHLORIDE 0.9 % IV SOLN: 250.0000 mL | INTRAVENOUS | Status: DC | PRN

## 2018-11-11 MED ORDER — NITROGLYCERIN 1 MG/10 ML FOR IR/CATH LAB
INTRA_ARTERIAL | Status: AC
  Filled 2018-11-11: qty 10

## 2018-11-11 MED ORDER — LIDOCAINE HCL (PF) 1 % IJ SOLN
INTRAMUSCULAR | Status: AC
  Filled 2018-11-11: qty 30

## 2018-11-11 MED ORDER — DOXERCALCIFEROL 4 MCG/2ML IV SOLN
1.0000 ug | INTRAVENOUS | Status: DC
  Administered 2018-11-15: 1 ug via INTRAVENOUS
  Filled 2018-11-11 (×2): qty 2

## 2018-11-11 MED ORDER — CLOPIDOGREL BISULFATE 300 MG PO TABS
300.0000 mg | ORAL_TABLET | Freq: Once | ORAL | Status: AC
  Administered 2018-11-11: 300 mg via ORAL
  Filled 2018-11-11: qty 1

## 2018-11-11 MED ORDER — SODIUM CHLORIDE 0.9 % IV SOLN
INTRAVENOUS | Status: DC
  Administered 2018-11-11: 10 mL/h via INTRAVENOUS

## 2018-11-11 MED ORDER — LIDOCAINE HCL (PF) 1 % IJ SOLN
INTRAMUSCULAR | Status: DC | PRN
  Administered 2018-11-11: 2 mL via SUBCUTANEOUS
  Administered 2018-11-11 (×2): 15 mL via SUBCUTANEOUS

## 2018-11-11 MED ORDER — VERAPAMIL HCL 2.5 MG/ML IV SOLN
INTRAVENOUS | Status: DC | PRN
  Administered 2018-11-11: 08:00:00 via INTRA_ARTERIAL

## 2018-11-11 MED ORDER — SODIUM CHLORIDE 0.9% FLUSH
3.0000 mL | Freq: Two times a day (BID) | INTRAVENOUS | Status: DC
  Administered 2018-11-13 – 2018-11-15 (×5): 3 mL via INTRAVENOUS

## 2018-11-11 MED ORDER — ALPRAZOLAM 0.25 MG PO TABS: 0.2500 mg | ORAL_TABLET | Freq: Two times a day (BID) | ORAL | Status: DC | PRN

## 2018-11-11 MED ORDER — ACETAMINOPHEN 325 MG PO TABS
650.0000 mg | ORAL_TABLET | ORAL | Status: DC | PRN
  Administered 2018-11-14 (×2): 650 mg via ORAL
  Filled 2018-11-11 (×2): qty 2

## 2018-11-11 SURGICAL SUPPLY — 16 items
CATH INFINITI 5 FR JL3.5 (CATHETERS) ×2 IMPLANT
CATH INFINITI 5FR MULTPACK ANG (CATHETERS) ×2 IMPLANT
CATH LAUNCHER 5F EBU3.5 (CATHETERS) ×2 IMPLANT
DEVICE RAD COMP TR BAND LRG (VASCULAR PRODUCTS) ×2 IMPLANT
GLIDESHEATH SLEND SS 6F .021 (SHEATH) ×2 IMPLANT
GUIDEWIRE ANGLED .035X150CM (WIRE) ×2 IMPLANT
GUIDEWIRE INQWIRE 1.5J.035X260 (WIRE) ×1 IMPLANT
INQWIRE 1.5J .035X260CM (WIRE) ×2
KIT HEART LEFT (KITS) ×2 IMPLANT
PACK CARDIAC CATHETERIZATION (CUSTOM PROCEDURE TRAY) ×2 IMPLANT
SHEATH GLIDE SLENDER 4/5FR (SHEATH) ×2 IMPLANT
SHEATH PINNACLE 6F 10CM (SHEATH) ×2 IMPLANT
TRANSDUCER W/STOPCOCK (MISCELLANEOUS) ×2 IMPLANT
TUBING CIL FLEX 10 FLL-RA (TUBING) ×2 IMPLANT
WIRE EMERALD 3MM-J .035X150CM (WIRE) ×2 IMPLANT
WIRE HI TORQ VERSACORE-J 145CM (WIRE) ×2 IMPLANT

## 2018-11-11 NOTE — Consult Note (Addendum)
[facility] Renal Consultation Note    Indication for Consultation:  Management of ESRD/hemodialysis, anemia, hypertension/volume, and secondary hyperparathyroidism. PCP:  HPI: Trevor [name] is a 72 y.o. male with ESRD, HTN, CAD, blindness, Hx AAA, Hx CVA who was admitted with ACS.  Presented to ED after waking from sleep with crushing CP radiating to the jaw. No N/V or fever. No abd pain. EMS called; code STEMI activated prior to arrival. EKG with diffuse ST depression. Given NTG, aspirin, O2. Cardiology consulted at took him to the cath lab. Unfortunately, due to his severe PAD - unable to get line in either groin or R radial artery, therefore LHC aborted and he was transferred to ICU with plan to treat medically.  Seen in room, looks well. Says CP resolved, currently eating lunch. Denies dypsnea, N/V, pain at all. Labs today with K 4.7, Hgb 13.6. On heparin drip.  From renal standpoint, dialyzes on MWF schedule at [facility] via L AVF. EDW actually recently raised to 56kg. The lowest that he had reached was 56.5kg in the past few weeks, but appears euvolemic. Completed full HD yesterday.  Past Medical History:  Diagnosis Date  . AAA (abdominal aortic aneurysm) ([place])   . Anemia   . Anxiety   . Arthritis    arms and back  . Blindness   . CHF (congestive heart failure) ([place])   . Coronary artery disease    mild   . 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 ESRD on hemodialysis ([place])    started HD 2014-15  . Headache(784.0)   . HTN (hypertension)   . Protein-calorie malnutrition ([place])   . STEMI (ST elevation myocardial infarction) ([place]) 11/11/2018  . Stroke [facility]) 01/2018   no residual deficits   Past Surgical History:  Procedure Laterality Date  . A/V FISTULAGRAM N/A 07/29/2018   Procedure: A/V FISTULAGRAM - left upper extremity;  Surgeon: Waynetta Sandy, MD;  Location: [place] CV [facility];  Service: Cardiovascular;   Laterality: N/A;  . AXILLARY-FEMORAL BYPASS GRAFT  06/28/2017   Procedure: BYPASS GRAFT RIGHT AXILLA-BIFEMORAL USING 8MM X 30CM AND 8MM X 60CM HEMASHIELD GOLD GRAFTS;  Surgeon: Rosetta Posner, MD;  Location: [place];  Service: Vascular;;  . CARDIAC CATHETERIZATION N/A 11/01/2016   Procedure: Left Heart Cath and Coronary Angiography;  Surgeon: Lorretta Harp, MD;  Location: [place] CV [facility];  Service: Cardiovascular;  Laterality: N/A;  . ENDARTERECTOMY FEMORAL Bilateral 06/28/2017   Procedure: ENDARTERECTOMY BILATERAL FEMORAL ARTERIES;  Surgeon: Rosetta Posner, MD;  Location: [place];  Service: Vascular;  Laterality: Bilateral;  . FISTULA SUPERFICIALIZATION Left 2/35/3614   Procedure: PLICATION OF ARTERIOVENOUS FISTULA LEFT ARM;  Surgeon: Waynetta Sandy, MD;  Location: [place];  Service: Vascular;  Laterality: Left;  . FISTULOGRAM Left 05/20/2018   Procedure: FISTULOGRAM WITH BALLOON ANGIOPLASTY LEFT ARM ARTERIOVENOUS FISTULA;  Surgeon: Waynetta Sandy, MD;  Location: [place];  Service: Vascular;  Laterality: Left;  . INSERTION OF DIALYSIS CATHETER  09/21/2012   Procedure: INSERTION OF DIALYSIS CATHETER;  Surgeon: Angelia Mould, MD;  Location: [place];  Service: Vascular;  Laterality: N/A;  Right Internal Jugular Placement  . LEFT HEART CATH N/A 11/11/2018   Procedure: LEFT HEART CATH;  Surgeon: Sherren Mocha, MD;  Location: [name] CV LAB;  Service: Cardiovascular;  Laterality: N/A;  . LEFT HEART CATHETERIZATION WITH CORONARY ANGIOGRAM N/A 09/28/2012   Procedure: LEFT HEART CATHETERIZATION  WITH CORONARY ANGIOGRAM;  Surgeon: Sherren Mocha, MD;  Location: [facility] CATH LAB;  Service: Cardiovascular;  Laterality: N/A;  . LEFT HEART CATHETERIZATION WITH CORONARY ANGIOGRAM N/A 07/07/2014   Procedure: LEFT HEART CATHETERIZATION WITH CORONARY ANGIOGRAM;  Surgeon: Leonie Man, MD;  Location: [facility] CATH LAB;  Service: Cardiovascular;  Laterality: N/A;  . PERIPHERAL VASCULAR BALLOON  ANGIOPLASTY Left 07/29/2018   Procedure: PERIPHERAL VASCULAR BALLOON ANGIOPLASTY;  Surgeon: Waynetta Sandy, MD;  Location: [place] [facility];  Service: Cardiovascular;  Laterality: Left;  CENTRAL VEIN  . PERIPHERAL VASCULAR INTERVENTION Left 07/29/2018   Procedure: PERIPHERAL VASCULAR INTERVENTION;  Surgeon: Waynetta Sandy, MD;  Location: [place] CV [facility];  Service: Cardiovascular;  Laterality: Left;  AXILLARY VEIN  . REVISON OF ARTERIOVENOUS FISTULA Left 08/17/2018   Procedure: REVISION OF ARTERIOVENOUS FISTULA LEFT ARM;  Surgeon: Waynetta Sandy, MD;  Location: [place];  Service: Vascular;  Laterality: Left;  . right hand    . TEE WITHOUT CARDIOVERSION  10/09/2011   Procedure: TRANSESOPHAGEAL ECHOCARDIOGRAM (TEE);  Surgeon: Lelon Perla, MD;  Location: [facility] ENDOSCOPY;  Service: Cardiovascular;  Laterality: N/A;   Family History  Problem Relation Age of Onset  . Heart attack Mother        MI in her 21s  . Diabetes Mother   . Alcohol abuse Father   . Anesthesia problems Neg Hx   . Hypotension Neg Hx   . Malignant hyperthermia Neg Hx   . Pseudochol deficiency Neg Hx    Social History:  reports that he has been smoking cigarettes. He has a 30.00 pack-year smoking history. He has never used smokeless tobacco. He reports that he does not drink alcohol or use drugs.  ROS: As per HPI otherwise negative.  Physical Exam: Vitals:   11/11/18 0930 11/11/18 0945 11/11/18 0950 11/11/18 1200  BP: (!) 97/50  (!) 96/50 (!) 115/57  Pulse: 73 69 63 78  Resp: 19 (!) 63 (!) 22 15  Temp:      TempSrc:      SpO2: 99% 98% 99% 100%     General: Frail man, NAD Head: Normocephalic, atraumatic, sclera non-icteric, mucus membranes are moist. Neck: Supple without lymphadenopathy/masses. JVD not elevated. Lungs: Clear bilaterally to auscultation without wheezes, rales, or rhonchi. Breathing is unlabored. Heart: RRR with normal S1, S2. No murmurs, rubs, or gallops  appreciated. Abdomen: Soft, non-tender, non-distended with normoactive bowel sounds.  Musculoskeletal:  Strength and tone lower than normal for age. Lower extremities: No edema or ischemic changes, no open wounds. Neuro: Alert and oriented X 3. Moves all extremities spontaneously. Psych:  Responds to questions appropriately with a normal affect. Dialysis Access: L AVF + thrill No Known Allergies Prior to Admission medications   Medication Sig Start Date End Date Taking? Authorizing Provider  amLODipine (NORVASC) 10 MG tablet Take 10 mg by mouth at bedtime.  04/13/17   [provider]  aspirin 81 MG chewable tablet Chew 1 tablet (81 mg total) by mouth daily. 11/04/16   Lyda Jester M, PA-C  atorvastatin (LIPITOR) 40 MG tablet Take 40 mg by mouth at bedtime.    [provider]  bisacodyl (DULCOLAX) 10 MG suppository Place 10 mg rectally daily as needed (constipation).     [provider]  cinacalcet (SENSIPAR) 90 MG tablet Take 90 mg by mouth daily with supper.    [provider]  clopidogrel (PLAVIX) 75 MG tablet Take 1 tablet (75 mg total) by mouth daily. 04/05/15   Dyann Kief,  Clifton James, MD  docusate sodium (COLACE) 100 MG capsule Take 200 mg by mouth at bedtime.     [provider]  doxercalciferol (HECTOROL) 4 MCG/2ML injection Inject 1 mL (2 mcg total) into the vein every Monday, Wednesday, and Friday with hemodialysis. Patient not taking: Reported on 09/20/2018 01/15/18   Colbert Ewing, MD  ferric citrate (AURYXIA) 1 GM 210 MG(Fe) tablet Take 630 mg by mouth 3 (three) times daily with meals.     [provider]  gabapentin (NEURONTIN) 300 MG capsule Take 1 capsule (300 mg total) by mouth at bedtime. 01/14/18   Colbert Ewing, MD  Glycopyrrolate [facility] REFILL KIT) 25 MCG/ML SOLN Inhale 25 mcg into the lungs 2 (two) times daily.    [provider]  hydrALAZINE (APRESOLINE) 50 MG tablet Take 50 mg by mouth 3 (three) times  daily.    [provider]  HYDROcodone-acetaminophen (NORCO) 5-325 MG tablet Take 1 tablet by mouth every 6 (six) hours as needed for moderate pain. Patient not taking: Reported on 09/20/2018 08/17/18   Dagoberto Ligas, PA-C  Icosapent Ethyl (VASCEPA) 1 g CAPS Take 1 g by mouth 2 (two) times daily.     [provider]  ipratropium-albuterol (DUONEB) 0.5-2.5 (3) MG/3ML SOLN Take 3 mLs by nebulization every 8 (eight) hours as needed (for copd).    [provider]  isosorbide mononitrate (IMDUR) 30 MG 24 hr tablet Take 30 mg by mouth daily.    [provider]  metoprolol tartrate (LOPRESSOR) 25 MG tablet Take 1 tablet (25 mg total) by mouth 2 (two) times daily. Patient not taking: Reported on 09/20/2018 11/03/16   Consuelo Pandy, PA-C  Nutritional Supplements (FEEDING SUPPLEMENT, NEPRO CARB STEADY,) LIQD Take 237 mLs by mouth 2 (two) times daily between meals. 12/11/16   Geradine Girt, DO  ondansetron (ZOFRAN) 4 MG tablet Take 4 mg by mouth every 6 (six) hours as needed for nausea or vomiting.    [provider]  senna (SENOKOT) 8.6 MG TABS tablet Take 1 tablet by mouth daily.    [provider]   Current Facility-Administered Medications  Medication Dose Route Frequency Provider Last Rate Last Dose  . 0.9 %  sodium chloride infusion   Intravenous Continuous Varney Biles, MD 10 mL/hr at 11/11/18 1200    . 0.9 %  sodium chloride infusion  250 mL Intravenous PRN Barrett, Rhonda G, PA-C      . 0.9 %  sodium chloride infusion  250 mL Intravenous PRN Sherren Mocha, MD      . acetaminophen (TYLENOL) tablet 650 mg  650 mg Oral Q4H PRN Barrett, Evelene Croon, PA-C      . ALPRAZolam (XANAX) tablet 0.25 mg  0.25 mg Oral BID PRN Barrett, Evelene Croon, PA-C      . atorvastatin (LIPITOR) tablet 80 mg  80 mg Oral q1800 Barrett, Evelene Croon, PA-C      . [START ON 11/12/2018] clopidogrel (PLAVIX) tablet 75 mg  75 mg Oral Q breakfast Sherren Mocha, MD      .  heparin ADULT infusion 100 units/mL (25000 units/241m sodium chloride 0.45%)  650 Units/hr Intravenous Continuous Mancheril, BDarnell Level RPH      . nitroGLYCERIN (NITROSTAT) SL tablet 0.4 mg  0.4 mg Sublingual Q5 Min x 3 PRN Barrett, Rhonda G, PA-C      . nitroGLYCERIN 50 mg in dextrose 5 % 250 mL (0.2 mg/mL) infusion  0-200 mcg/min Intravenous Continuous NVarney Biles MD   Stopped at  11/11/18 0800  . ondansetron (ZOFRAN) injection 4 mg  4 mg Intravenous Q6H PRN Barrett, Rhonda G, PA-C      . oxyCODONE (Oxy IR/ROXICODONE) immediate release tablet 5-10 mg  5-10 mg Oral Q4H PRN Sherren Mocha, MD      . sodium chloride flush (NS) 0.9 % injection 3 mL  3 mL Intravenous Q12H Barrett, Rhonda G, PA-C      . sodium chloride flush (NS) 0.9 % injection 3 mL  3 mL Intravenous PRN Barrett, Rhonda G, PA-C      . sodium chloride flush (NS) 0.9 % injection 3 mL  3 mL Intravenous Q12H Sherren Mocha, MD      . sodium chloride flush (NS) 0.9 % injection 3 mL  3 mL Intravenous PRN Sherren Mocha, MD      . zolpidem (AMBIEN) tablet 5 mg  5 mg Oral QHS PRN Lonn Georgia, PA-C       Labs: Basic Metabolic Panel: Recent Labs  Lab 11/11/18 0617 11/11/18 0757  NA 137 138  K 6.2* 4.7  CL 91* 97*  CO2 26  --   GLUCOSE 65* 78  BUN 20 20  CREATININE 5.82* 5.20*  CALCIUM 9.6  --    Liver Function Tests: Recent Labs  Lab 11/11/18 0617  AST 26  ALT 12  ALKPHOS 93  BILITOT 1.2  PROT 7.9  ALBUMIN 3.6   CBC: Recent Labs  Lab 11/11/18 0617 11/11/18 0757  WBC 6.6  --   NEUTROABS 3.6  --   HGB 14.2 13.6  HCT 44.4 40.0  MCV 89.5  --   PLT 164  --    Cardiac Enzymes: Recent Labs  Lab 11/11/18 0617 11/11/18 1053  TROPONINI 0.04* 2.71*   Studies/Results: Dg Chest Port 1 View  Result Date: 11/11/2018 CLINICAL DATA:  Chest pain EXAM: PORTABLE CHEST 1 VIEW COMPARISON:  09/20/2018 FINDINGS: Cardiac shadow is at the upper limits of normal but accentuated by the portable technique. Diffuse  aortic calcifications are again identified. The lungs are well aerated bilaterally. Previously seen infiltrative changes have resolved. No sizable effusion is seen. No bony abnormality is noted. IMPRESSION: No acute abnormality seen. Electronically Signed   By: Inez Catalina M.D.   On: 11/11/2018 07:23   Dialysis Orders:  MWF at [facility] 4hr, 400/800, EDW 56kg, 2K/2.25Ca, UFP #4, AVF, no heparin - Hectoral 43mg IV q HD  Assessment/Plan: 1.  CAD/ACS/diffuse ST depression: Failed LHC d/t severe PAD, treating medically. Trop ^ 2.71. CP resolved at this time. 2.  ESRD: Usual MWF schedule. No need for HD today, will plan on usual HD tomorrow. 3.  Hypertension/volume: BP low/stable. Low UF goal tomorrow. 4.  Anemia: Hgb 13.6. No ESA needed. 5.  Metabolic bone disease: Ca ok, Phos pending. Resume Hectoral with HD tomorrow. 6.  Hx AAA 7.  Hx CVA 8.  Blindness  KVeneta Penton PHershal Coria12/10/2018, 12:56 PM  [facility] Pager: [phone] Pt seen, examined and agree w A/P as above.  RKelly SplinterMD CNewell Rubbermaidpager [phone]  11/11/2018, 1:46 PM

## 2018-11-11 NOTE — ED Provider Notes (Signed)
[facility] EMERGENCY DEPARTMENT Provider Note   CSN: [id number] Arrival date & time: 11/11/18  4010     History   Chief Complaint Chief Complaint  Patient presents with  . Code STEMI    HPI Trevor [name] is a 72 y.o. male.  HPI  72 year old male with history of AAA, ESRD on hemodialysis, CAD, CHF, stroke who comes in with chief complaint of chest pain. Patient states that he woke up with midsternal chest pain described as heaviness.  Pain is radiating to the jaw and similar to his previous MI pain.  Patient has had associated shortness of breath but denies any nausea, diaphoresis.  He is up-to-date with his hemodialysis.  Code STEMI was activated on the field.  Past Medical History:  Diagnosis Date  . AAA (abdominal aortic aneurysm) ([place])   . Anemia   . Anxiety   . Arthritis    arms and back  . Blindness   . CHF (congestive heart failure) ([place])   . Coronary artery disease    mild   . 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 ESRD on hemodialysis ([place])    started HD 2014-15  . Headache(784.0)   . HTN (hypertension)   . Protein-calorie malnutrition ([place])   . Stroke [facility]) 01/2018   no residual deficits    Patient Active Problem List   Diagnosis Date Noted  . Pulmonary edema 09/20/2018  . Acute respiratory failure with hypoxia ([place]) 03/29/2018  . ESRD on dialysis ([place]) 03/29/2018  . Sleep stage dysfunction 11/10/2017  . 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 Vascular device, implant, or graft complication 27/25/3664  . Peripheral arterial disease ([place]) 10/20/2017  . Non-allergic rhinitis 10/20/2017  . 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 BPH (benign prostatic hyperplasia) 07/07/2017  . At risk for adverse drug reaction 07/02/2017  . Ischemia of extremity 06/28/2017  . Acute pulmonary edema ([place]) 06/27/2017  . Hypertensive urgency 01/24/2017  . 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  Essential hypertension 03/08/2016  . Dyslipidemia 01/12/2016  . Chest pain 08/12/2015  . Accelerated hypertension 08/12/2015  . Carotid stenosis   . Abdominal aortic aneurysm ([place]) 03/06/2015  . Protein-calorie malnutrition, severe ([place]) 05/20/2014  . End-stage renal disease on hemodialysis ([place]) 05/19/2014  . Anemia of renal disease 05/19/2014  . Coronary artery disease, non-occlusive:  09/28/2012  . Secondary hyperparathyroidism ([place]) 09/27/2012  . Non compliance with medical treatment 09/27/2012  . Acute on chronic combined systolic and diastolic CHF (congestive heart failure) ([place]) 10/10/2011  . A-fib ([place]) 10/08/2011  . History of CVA (cerebrovascular accident) 10/07/2011  . Cocaine abuse ([place]) 10/07/2011  . Tobacco use disorder 08/07/2011  . Bruit 08/07/2011    Past Surgical History:  Procedure Laterality Date  . A/V FISTULAGRAM N/A 07/29/2018   Procedure: A/V FISTULAGRAM - left upper extremity;  Surgeon: Waynetta Sandy, MD;  Location: [facility] CV LAB;  Service: Cardiovascular;  Laterality: N/A;  . AXILLARY-FEMORAL BYPASS GRAFT  06/28/2017   Procedure: BYPASS GRAFT RIGHT AXILLA-BIFEMORAL USING 8MM X 30CM AND 8MM X 60CM HEMASHIELD GOLD GRAFTS;  Surgeon: Rosetta Posner, MD;  Location: [facility];  Service: Vascular;;  . CARDIAC CATHETERIZATION N/A 11/01/2016   Procedure: Left Heart Cath and Coronary Angiography;  Surgeon: Lorretta Harp, MD;  Location: [facility];  Service: Cardiovascular;  Laterality: N/A;  . ENDARTERECTOMY FEMORAL Bilateral 06/28/2017   Procedure: ENDARTERECTOMY BILATERAL FEMORAL ARTERIES;  Surgeon: Donnetta Hutching,  Arvilla Meres, MD;  Location: [facility];  Service: Vascular;  Laterality: Bilateral;  . FISTULA SUPERFICIALIZATION Left 6/76/1951   Procedure: PLICATION OF ARTERIOVENOUS FISTULA LEFT ARM;  Surgeon: Waynetta Sandy, MD;  Location: [place];  Service: Vascular;  Laterality: Left;  . FISTULOGRAM Left 05/20/2018   Procedure: FISTULOGRAM WITH BALLOON  ANGIOPLASTY LEFT ARM ARTERIOVENOUS FISTULA;  Surgeon: Waynetta Sandy, MD;  Location: [place] [facility];  Service: Vascular;  Laterality: Left;  . INSERTION OF DIALYSIS CATHETER  09/21/2012   Procedure: INSERTION OF DIALYSIS CATHETER;  Surgeon: Angelia Mould, MD;  Location: [facility];  Service: Vascular;  Laterality: N/A;  Right Internal Jugular Placement  . LEFT HEART CATHETERIZATION WITH CORONARY ANGIOGRAM N/A 09/28/2012   Procedure: LEFT HEART CATHETERIZATION WITH CORONARY ANGIOGRAM;  Surgeon: Sherren Mocha, MD;  Location: [facility] CATH LAB;  Service: Cardiovascular;  Laterality: N/A;  . LEFT HEART CATHETERIZATION WITH CORONARY ANGIOGRAM N/A 07/07/2014   Procedure: LEFT HEART CATHETERIZATION WITH CORONARY ANGIOGRAM;  Surgeon: Leonie Man, MD;  Location: [facility] CATH LAB;  Service: Cardiovascular;  Laterality: N/A;  . PERIPHERAL VASCULAR BALLOON ANGIOPLASTY Left 07/29/2018   Procedure: PERIPHERAL VASCULAR BALLOON ANGIOPLASTY;  Surgeon: Waynetta Sandy, MD;  Location: [place] CV LAB;  Service: Cardiovascular;  Laterality: Left;  CENTRAL VEIN  . PERIPHERAL VASCULAR INTERVENTION Left 07/29/2018   Procedure: PERIPHERAL VASCULAR INTERVENTION;  Surgeon: Waynetta Sandy, MD;  Location: [place] CV LAB;  Service: Cardiovascular;  Laterality: Left;  AXILLARY VEIN  . REVISON OF ARTERIOVENOUS FISTULA Left 08/17/2018   Procedure: REVISION OF ARTERIOVENOUS FISTULA LEFT ARM;  Surgeon: Waynetta Sandy, MD;  Location: [facility];  Service: Vascular;  Laterality: Left;  . right hand    . TEE WITHOUT CARDIOVERSION  10/09/2011   Procedure: TRANSESOPHAGEAL ECHOCARDIOGRAM (TEE);  Surgeon: Lelon Perla, MD;  Location: [facility] ENDOSCOPY;  Service: Cardiovascular;  Laterality: N/A;        Home Medications    Prior to Admission medications   Medication Sig Start Date End Date Taking? Authorizing Provider  amLODipine (NORVASC) 10 MG tablet Take 10 mg by mouth at bedtime.  04/13/17   [provider]  aspirin 81 MG chewable tablet Chew 1 tablet (81 mg total) by mouth daily. 11/04/16   Lyda Jester M, PA-C  atorvastatin (LIPITOR) 40 MG tablet Take 40 mg by mouth at bedtime.    [provider]  bisacodyl (DULCOLAX) 10 MG suppository Place 10 mg rectally daily as needed (constipation).     [provider]  cinacalcet (SENSIPAR) 90 MG tablet Take 90 mg by mouth daily with supper.    [provider]  clopidogrel (PLAVIX) 75 MG tablet Take 1 tablet (75 mg total) by mouth daily. 04/05/15   Barton Dubois, MD  docusate sodium (COLACE) 100 MG capsule Take 200 mg by mouth at bedtime.     [provider]  doxercalciferol (HECTOROL) 4 MCG/2ML injection Inject 1 mL (2 mcg total) into the vein every Monday, Wednesday, and Friday with hemodialysis. Patient not taking: Reported on 09/20/2018 01/15/18   Colbert Ewing, MD  ferric citrate (AURYXIA) 1 GM 210 MG(Fe) tablet Take 630 mg by mouth 3 (three) times daily with meals.     [provider]  gabapentin (NEURONTIN) 300 MG capsule Take 1 capsule (300 mg total) by mouth at bedtime. 01/14/18   Colbert Ewing, MD  Glycopyrrolate [facility] REFILL KIT) 25 MCG/ML SOLN Inhale 25 mcg into the lungs 2 (two) times daily.    [provider]  hydrALAZINE (APRESOLINE) 50 MG tablet Take 50 mg by mouth 3 (three) times daily.    [provider]  HYDROcodone-acetaminophen (NORCO) 5-325 MG tablet Take 1 tablet by mouth every 6 (six) hours as needed for moderate pain. Patient not taking: Reported on 09/20/2018 08/17/18   Dagoberto Ligas, PA-C  Icosapent Ethyl (VASCEPA) 1 g CAPS Take 1 g by mouth 2 (two) times daily.     [provider]  ipratropium-albuterol (DUONEB) 0.5-2.5 (3) MG/3ML SOLN Take 3 mLs by nebulization every 8 (eight) hours as needed (for copd).    [provider]  isosorbide mononitrate (IMDUR) 30 MG 24 hr tablet Take 30 mg by mouth daily.    [provider]  metoprolol tartrate (LOPRESSOR) 25 MG tablet Take 1 tablet (25 mg total) by mouth 2 (two) times daily. Patient not taking: Reported on 09/20/2018 11/03/16   Consuelo Pandy, PA-C  Nutritional Supplements (FEEDING SUPPLEMENT, NEPRO CARB STEADY,) LIQD Take 237 mLs by mouth 2 (two) times daily between meals. 12/11/16   Geradine Girt, DO  ondansetron (ZOFRAN) 4 MG tablet Take 4 mg by mouth every 6 (six) hours as needed for nausea or vomiting.    [provider]  senna (SENOKOT) 8.6 MG TABS tablet Take 1 tablet by mouth daily.    [provider]    Family History Family History  Problem Relation Age of Onset  . Heart attack Mother        MI in her 11s  . Diabetes Mother   . Alcohol abuse Father   . Anesthesia problems Neg Hx   . Hypotension Neg Hx   . Malignant hyperthermia Neg Hx   . Pseudochol deficiency Neg Hx     Social History Social History   Tobacco Use  . Smoking status: Current Every Day Smoker    Packs/day: 0.50    Years: 60.00    Pack years: 30.00    Types: Cigarettes  . Smokeless tobacco: Never Used  . Tobacco comment: 5-6 cigarettes per day  Substance Use Topics  . Alcohol use: No    Alcohol/week: 0.0 standard drinks    Comment: Occasional  . Drug use: No    Frequency: 2.0 times per week    Types: "Crack" cocaine     Allergies   Patient has no known allergies.   Review of Systems Review of Systems  Constitutional: Positive for activity change.  Respiratory: Positive for shortness of breath.   Cardiovascular: Positive for chest pain.  Gastrointestinal: Negative for nausea and vomiting.  Neurological: Negative for dizziness.  All other systems reviewed and are negative.    Physical Exam Updated Vital Signs BP (!) 167/74   Pulse (!) 108   Temp 98.3 F (36.8 C)   Resp 20   SpO2 97%   Physical Exam Vitals signs and nursing note reviewed.  Constitutional:      Appearance: He is well-developed. He is  ill-appearing. He is not toxic-appearing.     Comments: Somnolent  HENT:     Head: Atraumatic.  Neck:     Musculoskeletal: Neck supple.  Cardiovascular:     Rate and Rhythm: Normal rate.  Pulmonary:     Effort: Pulmonary effort is normal.  Skin:    General: Skin is warm.  Neurological:     Mental Status: He is oriented to person, place, and time.      ED Treatments / Results  Labs (all labs ordered are listed, but only  abnormal results are displayed) Labs Reviewed  CBC WITH DIFFERENTIAL/PLATELET  PROTIME-INR  APTT  COMPREHENSIVE METABOLIC PANEL  TROPONIN I  LIPID PANEL    EKG EKG Interpretation  Date/Time:  Thursday November 11 2018 06:36:14 EST Ventricular Rate:  103 PR Interval:    QRS Duration: 96 QT Interval:  380 QTC Calculation: 498 R Axis:   -37 Text Interpretation:  Sinus tachycardia Ventricular premature complex LVH with secondary repolarization abnormality ST depression, consider ischemia, diffuse lds Borderline prolonged QT interval ST elevation in aVR diffuse ST depression Confirmed by Varney Biles [phone]) on 11/11/2018 6:56:10 AM   Radiology No results found.  Procedures .Critical Care Performed by: Varney Biles, MD Authorized by: Varney Biles, MD   Critical care provider statement:    Critical care time (minutes):  45   Critical care start time:  11/11/2018 6:15 AM   Critical care end time:  11/11/2018 7:03 AM   Critical care time was exclusive of:  Separately billable procedures and treating other patients   Critical care was necessary to treat or prevent imminent or life-threatening deterioration of the following conditions:  Cardiac failure   Critical care was time spent personally by me on the following activities:  Discussions with consultants, evaluation of patient's response to treatment, examination of patient, ordering and performing treatments and interventions, ordering and review of laboratory studies, ordering and review of  radiographic studies, pulse oximetry, re-evaluation of patient's condition, obtaining history from patient or surrogate and review of old charts   (including critical care time)  Medications Ordered in ED Medications  0.9 %  sodium chloride infusion (has no administration in time range)  nitroGLYCERIN 50 mg in dextrose 5 % 250 mL (0.2 mg/mL) infusion (10 mcg/min Intravenous New Bag/Given 11/11/18 0655)  heparin injection 4,000 Units (4,000 Units Intravenous Given 11/11/18 0654)     Initial Impression / Assessment and Plan / ED Course  I have reviewed the triage vital signs and the nursing notes.  Pertinent labs & imaging results that were available during my care of the patient were reviewed by me and considered in my medical decision making (see chart for details).    72 year old male comes in a chief complaint of chest pain.  He has known history of ESRD, CAD with last cath showing 60% obstruction in 2 vessels -being medically managed. Patient's chest pain has some typical features as it is described as heaviness and radiating to the jaw.  EKG showing ST elevation in aVR with global ST depression.  Suspect that patient is having LAD thrombosis.  STEMI was activated on the field. Patient had received full dose aspirin.  We will give him heparin.  He is still having chest pain however the symptoms have improved. Cardiology team at the bedside.   Final Clinical Impressions(s) / ED Diagnoses   Final diagnoses:  Ischemic chest pain [facility])    ED Discharge Orders    None       Varney Biles, MD 11/11/18 [phone]

## 2018-11-11 NOTE — Progress Notes (Signed)
   11/11/18 0700  Clinical Encounter Type  Visited With Health care provider  Visit Type ED  Referral From Nurse  Consult/Referral To Chaplain   Chaplain responded to a Code Stemi.  PT being evaluated by medical staff.  PT requested his daughter be called.  Chaplain called and spoke to [facility] [phone].  Daughter appreciated the call and asked to be updated.  Will follow and support as needed. Chaplain Katherene Ponto

## 2018-11-11 NOTE — ED Triage Notes (Signed)
Pt from [place] as Code STEMI. Pt reports waking up this morning with L sided chest pain, SOB, and generalized weakness. Pt also MWF dialysis pt, last treatment on Wednesday. Fistula noted to L arm

## 2018-11-11 NOTE — Progress Notes (Signed)
ANTICOAGULATION CONSULT NOTE - Initial Consult  Pharmacy Consult for Heparin Indication: chest pain/ACS  No Known Allergies  Patient Measurements:   Heparin Dosing Weight: 55 kg   Vital Signs: Temp: 97.6 F (36.4 C) (12/12 0907) Temp Source: Oral (12/12 0907) BP: 99/59 (12/12 0900) Pulse Rate: 98 (12/12 0820)  Labs: Recent Labs    11/11/18 0617 11/11/18 0757  HGB 14.2 13.6  HCT 44.4 40.0  PLT 164  --   APTT 33  --   LABPROT 13.5  --   INR 1.04  --   CREATININE 5.82* 5.20*  TROPONINI 0.04*  --     CrCl cannot be calculated (Unknown ideal weight.).   Medical History: Past Medical History:  Diagnosis Date  . AAA (abdominal aortic aneurysm) ([place])   . Anemia   . Anxiety   . Arthritis    arms and back  . Blindness   . CHF (congestive heart failure) ([place])   . Coronary artery disease    mild   . 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 ESRD on hemodialysis ([place])    started HD 2014-15  . Headache(784.0)   . HTN (hypertension)   . Protein-calorie malnutrition ([place])   . STEMI (ST elevation myocardial infarction) ([place]) 11/11/2018  . Stroke [facility]) 01/2018   no residual deficits    Medications:  Medications Prior to Admission  Medication Sig Dispense Refill Last Dose  . amLODipine (NORVASC) 10 MG tablet Take 10 mg by mouth at bedtime.   0 09/19/2018 at Unknown time  . aspirin 81 MG chewable tablet Chew 1 tablet (81 mg total) by mouth daily.   09/19/2018 at Unknown time  . atorvastatin (LIPITOR) 40 MG tablet Take 40 mg by mouth at bedtime.   09/19/2018 at Unknown time  . bisacodyl (DULCOLAX) 10 MG suppository Place 10 mg rectally daily as needed (constipation).    unknown  . cinacalcet (SENSIPAR) 90 MG tablet Take 90 mg by mouth daily with supper.   09/19/2018 at Unknown time  . clopidogrel (PLAVIX) 75 MG tablet Take 1 tablet (75 mg total) by mouth daily. 30 tablet 2 09/19/2018 at Unknown time  . docusate sodium  (COLACE) 100 MG capsule Take 200 mg by mouth at bedtime.    09/19/2018 at Unknown time  . doxercalciferol (HECTOROL) 4 MCG/2ML injection Inject 1 mL (2 mcg total) into the vein every Monday, Wednesday, and Friday with hemodialysis. (Patient not taking: Reported on 09/20/2018) 2 mL  Not Taking at Unknown time  . ferric citrate (AURYXIA) 1 GM 210 MG(Fe) tablet Take 630 mg by mouth 3 (three) times daily with meals.    09/19/2018 at Unknown time  . gabapentin (NEURONTIN) 300 MG capsule Take 1 capsule (300 mg total) by mouth at bedtime. 30 capsule 0 09/19/2018 at Unknown time  . Glycopyrrolate (LONHALA MAGNAIR REFILL KIT) 25 MCG/ML SOLN Inhale 25 mcg into the lungs 2 (two) times daily.   09/19/2018 at Unknown time  . hydrALAZINE (APRESOLINE) 50 MG tablet Take 50 mg by mouth 3 (three) times daily.   09/19/2018 at Unknown time  . HYDROcodone-acetaminophen (NORCO) 5-325 MG tablet Take 1 tablet by mouth every 6 (six) hours as needed for moderate pain. (Patient not taking: Reported on 09/20/2018) 12 tablet 0 Not Taking at Unknown time  . Icosapent Ethyl (VASCEPA) 1 g CAPS Take 1 g by mouth 2 (two) times daily.    09/19/2018 at Unknown time  .  ipratropium-albuterol (DUONEB) 0.5-2.5 (3) MG/3ML SOLN Take 3 mLs by nebulization every 8 (eight) hours as needed (for copd).   unknown  . isosorbide mononitrate (IMDUR) 30 MG 24 hr tablet Take 30 mg by mouth daily.   09/19/2018 at Unknown time  . metoprolol tartrate (LOPRESSOR) 25 MG tablet Take 1 tablet (25 mg total) by mouth 2 (two) times daily. (Patient not taking: Reported on 09/20/2018) 60 tablet 5 Not Taking at Unknown time  . Nutritional Supplements (FEEDING SUPPLEMENT, NEPRO CARB STEADY,) LIQD Take 237 mLs by mouth 2 (two) times daily between meals.  0 09/19/2018 at Unknown time  . ondansetron (ZOFRAN) 4 MG tablet Take 4 mg by mouth every 6 (six) hours as needed for nausea or vomiting.   unknown  . senna (SENOKOT) 8.6 MG TABS tablet Take 1 tablet by mouth daily.    09/19/2018 at Unknown time    Assessment: 59 YOM w/ h/o CAD s/p CATH in 2017 here with chest pain. Unable to perform cardiac catheterization. Pharmacy consulted to dose IV heparin to help with medical management. Heparin to start 8 hours after sheath pull. H/H and Plt wnl.   Goal of Therapy:  Heparin level 0.3-0.7 units/ml Monitor platelets by anticoagulation protocol: Yes   Plan:  Heparin 650 units/hr 8 hours post sheath removal 8 hr HL Monitor daily HL, CBC and s/s of bleeding   Albertina Parr, PharmD., BCPS Clinical Pharmacist Clinical phone for 11/11/18 until 3:30pm: [phone] If after 3:30pm, please refer to [facility] for unit-specific pharmacist

## 2018-11-11 NOTE — H&P (Addendum)
Cardiology Admission History and Physical:   Patient ID: Trevor [name]; MRN: [id number]; DOB: 1946/07/31   Admission date: 11/11/2018  Primary Care Provider: [name] Primary Cardiologist: Kirk Ruths, MD 2017 Primary Electrophysiologist:  None  Chief Complaint:  STEMI  Patient Profile:   Trevor [name] is a 72 y.o. 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 He woke w/ chest pain this am. Per reports, after about an hour, he called for help. EMS was called. They gave him 324 mg ASA and transported him to the ER.  In the ER, he states the pain was 8-9/10 at first, currently has a numb feeling in his chest, still w/ pain also. C/o some SOB, denies N&V or diaphoresis. No other recent sx. No recent illnesses.   Last admission was 10/21-10/23/2019 for volume overload after they increased his dry weight at dialysis.  Had full HD treatment yesterday.  EDW is 55.3 kg.  He uses a wheelchair for ambulation.    Past Medical History:  Diagnosis Date  . AAA (abdominal aortic aneurysm) ([place])   . Anemia   . Anxiety   . Arthritis    arms and back  . Blindness   . CHF (congestive heart failure) ([place])   . Coronary artery disease    mild   . 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 ESRD on hemodialysis ([place])    started HD 2014-15  . Headache(784.0)   . HTN (hypertension)   . Protein-calorie malnutrition ([place])   . STEMI (ST elevation myocardial infarction) ([place]) 11/11/2018  . Stroke [facility]) 01/2018   no residual deficits    Past Surgical History:  Procedure Laterality Date  . A/V FISTULAGRAM N/A 07/29/2018   Procedure: A/V FISTULAGRAM - left upper extremity;  Surgeon: Waynetta Sandy, MD;  Location: [place] [facility];  Service: Cardiovascular;   Laterality: N/A;  . AXILLARY-FEMORAL BYPASS GRAFT  06/28/2017   Procedure: BYPASS GRAFT RIGHT AXILLA-BIFEMORAL USING 8MM X 30CM AND 8MM X 60CM HEMASHIELD GOLD GRAFTS;  Surgeon: Rosetta Posner, MD;  Location: [place];  Service: Vascular;;  . CARDIAC CATHETERIZATION N/A 11/01/2016   Procedure: Left Heart Cath and Coronary Angiography;  Surgeon: Lorretta Harp, MD;  Location: [place] CV LAB;  Service: Cardiovascular;  Laterality: N/A;  . ENDARTERECTOMY FEMORAL Bilateral 06/28/2017   Procedure: ENDARTERECTOMY BILATERAL FEMORAL ARTERIES;  Surgeon: Rosetta Posner, MD;  Location: [place];  Service: Vascular;  Laterality: Bilateral;  . FISTULA SUPERFICIALIZATION Left 9/98/3382   Procedure: PLICATION OF ARTERIOVENOUS FISTULA LEFT ARM;  Surgeon: Waynetta Sandy, MD;  Location: [place];  Service: Vascular;  Laterality: Left;  . FISTULOGRAM Left 05/20/2018   Procedure: FISTULOGRAM WITH BALLOON ANGIOPLASTY LEFT ARM ARTERIOVENOUS FISTULA;  Surgeon: Waynetta Sandy, MD;  Location: [place];  Service: Vascular;  Laterality: Left;  . INSERTION OF DIALYSIS CATHETER  09/21/2012   Procedure: INSERTION OF DIALYSIS CATHETER;  Surgeon: Angelia Mould, MD;  Location: [place];  Service: Vascular;  Laterality: N/A;  Right Internal Jugular Placement  . LEFT HEART CATHETERIZATION WITH CORONARY ANGIOGRAM N/A 09/28/2012   Procedure: LEFT HEART CATHETERIZATION WITH CORONARY ANGIOGRAM;  Surgeon: Sherren Mocha, MD;  Location: [facility] CATH LAB;  Service: Cardiovascular;  Laterality:  N/A;  . LEFT HEART CATHETERIZATION WITH CORONARY ANGIOGRAM N/A 07/07/2014   Procedure: LEFT HEART CATHETERIZATION WITH CORONARY ANGIOGRAM;  Surgeon: Leonie Man, MD;  Location[facility] C[facility];  Service: Cardiovascular;  Laterality: N/A;  . PERIPHERAL VASCULAR BALLOON ANGIOPLASTY Left 07/29/2018   Procedure: PERIPHERAL VASCULAR BALLOON ANGIOPLASTY;  Surgeon: Waynetta Sandy, MD;  Location: [place] CV LAB;  Service: Cardiovascular;   Laterality: Left;  CENTRAL VEIN  . PERIPHERAL VASCULAR INTERVENTION Left 07/29/2018   Procedure: PERIPHERAL VASCULAR INTERVENTION;  Surgeon: Waynetta Sandy, MD;  Location: [place] CV LAB;  Service: Cardiovascular;  Laterality: Left;  AXILLARY VEIN  . REVISON OF ARTERIOVENOUS FISTULA Left 08/17/2018   Procedure: REVISION OF ARTERIOVENOUS FISTULA LEFT ARM;  Surgeon: Waynetta Sandy, MD;  Location: [place];  Service: Vascular;  Laterality: Left;  . right hand    . TEE WITHOUT CARDIOVERSION  10/09/2011   Procedure: TRANSESOPHAGEAL ECHOCARDIOGRAM (TEE);  Surgeon: Lelon Perla, MD;  Location: [facility] ENDOSCOPY;  Service: Cardiovascular;  Laterality: N/A;     Medications Prior to Admission: Prior to Admission medications   Medication Sig Start Date End Date Taking? Authorizing Provider  amLODipine (NORVASC) 10 MG tablet Take 10 mg by mouth at bedtime.  04/13/17   [provider]  aspirin 81 MG chewable tablet Chew 1 tablet (81 mg total) by mouth daily. 11/04/16   Lyda Jester M, PA-C  atorvastatin (LIPITOR) 40 MG tablet Take 40 mg by mouth at bedtime.    [provider]  bisacodyl (DULCOLAX) 10 MG suppository Place 10 mg rectally daily as needed (constipation).     [provider]  cinacalcet (SENSIPAR) 90 MG tablet Take 90 mg by mouth daily with supper.    [provider]  clopidogrel (PLAVIX) 75 MG tablet Take 1 tablet (75 mg total) by mouth daily. 04/05/15   Barton Dubois, MD  docusate sodium (COLACE) 100 MG capsule Take 200 mg by mouth at bedtime.     [provider]  doxercalciferol (HECTOROL) 4 MCG/2ML injection Inject 1 mL (2 mcg total) into the vein every Monday, Wednesday, and Friday with hemodialysis. Patient not taking: Reported on 09/20/2018 01/15/18   Colbert Ewing, MD  ferric citrate (AURYXIA) 1 GM 210 MG(Fe) tablet Take 630 mg by mouth 3 (three) times daily with meals.     [provider]  gabapentin  (NEURONTIN) 300 MG capsule Take 1 capsule (300 mg total) by mouth at bedtime. 01/14/18   Colbert Ewing, MD  Glycopyrrolate [facility] REFILL KIT) 25 MCG/ML SOLN Inhale 25 mcg into the lungs 2 (two) times daily.    [provider]  hydrALAZINE (APRESOLINE) 50 MG tablet Take 50 mg by mouth 3 (three) times daily.    [provider]  HYDROcodone-acetaminophen (NORCO) 5-325 MG tablet Take 1 tablet by mouth every 6 (six) hours as needed for moderate pain. Patient not taking: Reported on 09/20/2018 08/17/18   Dagoberto Ligas, PA-C  Icosapent Ethyl (VASCEPA) 1 g CAPS Take 1 g by mouth 2 (two) times daily.     [provider]  ipratropium-albuterol (DUONEB) 0.5-2.5 (3) MG/3ML SOLN Take 3 mLs by nebulization every 8 (eight) hours as needed (for copd).    [provider]  isosorbide mononitrate (IMDUR) 30 MG 24 hr tablet Take 30 mg by mouth daily.    [provider]  metoprolol tartrate (LOPRESSOR) 25 MG tablet Take 1 tablet (25 mg total) by mouth 2 (two) times daily. Patient not taking: Reported on 09/20/2018  11/03/16   Lyda Jester M, PA-C  Nutritional Supplements (FEEDING SUPPLEMENT, NEPRO CARB STEADY,) LIQD Take 237 mLs by mouth 2 (two) times daily between meals. 12/11/16   Geradine Girt, DO  ondansetron (ZOFRAN) 4 MG tablet Take 4 mg by mouth every 6 (six) hours as needed for nausea or vomiting.    [provider]  senna (SENOKOT) 8.6 MG TABS tablet Take 1 tablet by mouth daily.    [provider]     Allergies:   No Known Allergies  Social History:   Social History   Socioeconomic History  . Marital status: Widowed    Spouse name: Not on file  . Number of children: 2  . Years of education: Not on file  . Highest education level: Not on file  Occupational History  . Not on file  Social Needs  . Financial resource strain: Not on file  . Food insecurity:    Worry: Not on file    Inability: Not on file  .  Transportation needs:    Medical: Not on file    Non-medical: Not on file  Tobacco Use  . Smoking status: Current Every Day Smoker    Packs/day: 0.50    Years: 60.00    Pack years: 30.00    Types: Cigarettes  . Smokeless tobacco: Never Used  . Tobacco comment: 5-6 cigarettes per day  Substance and Sexual Activity  . Alcohol use: No    Alcohol/week: 0.0 standard drinks    Comment: Occasional  . Drug use: No    Frequency: 2.0 times per week    Types: "Crack" cocaine  . Sexual activity: Yes  Lifestyle  . Physical activity:    Days per week: Not on file    Minutes per session: Not on file  . Stress: Not on file  Relationships  . Social connections:    Talks on phone: Not on file    Gets together: Not on file    Attends religious service: Not on file    Active member of club or organization: Not on file    Attends meetings of clubs or organizations: Not on file    Relationship status: Not on file  . Intimate partner violence:    Fear of current or ex partner: Not on file    Emotionally abused: Not on file    Physically abused: Not on file    Forced sexual activity: Not on file  Other Topics Concern  . Not on file  Social History Narrative   Used to work in a Loss adjuster, chartered.     Family History:   The patient's family history includes Alcohol abuse in his father; Diabetes in his mother; Heart attack in his mother. There is no history of Anesthesia problems, Hypotension, Malignant hyperthermia, or Pseudochol deficiency.   The patient He indicated that his mother is deceased. He indicated that his father is deceased. He indicated that his maternal grandmother is deceased. He indicated that his maternal grandfather is deceased. He indicated that his paternal grandmother is deceased. He indicated that his paternal grandfather is deceased. He indicated that the status of his neg hx is unknown.    ROS:  Please see the history of present illness.  All other ROS reviewed and  negative.     Physical Exam/Data:   Vitals:   11/11/18 0647 11/11/18 0719  BP: (!) 167/74   Pulse: (!) 108   Resp: 20   Temp: 98.3 F (36.8 C)  SpO2: 97% 99%   No intake or output data in the 24 hours ending 11/11/18 0731 There were no vitals filed for this visit. There is no height or weight on file to calculate BMI.  General: Very slender, well developed, male in mild distress HEENT: normal Lymph: no adenopathy Neck:  JVD elevated but patient head is less than 30 degrees up Endocrine:  No thryomegaly Vascular: No carotid bruits; right radial pulse 2+, lower extremity pulses 1+ bilaterally, fistula left upper arm with palpable thrill Cardiac:  normal S1, S2; RRR; no murmur, no rub or gallop  Lungs:  clear to auscultation bilaterally, no wheezing, rhonchi, few rales bases  Abd: soft, nontender, no hepatomegaly  Ext: No edema Musculoskeletal:  No deformities, BUE and BLE strength weak but equal Skin: warm and dry  Neuro:  CNs 2-12 intact, no focal abnormalities noted Psych:  Normal affect    EKG:  The ECG that was done 12/12 was personally reviewed and demonstrates SR, ST w/ ST depression infer-lateral leads  Relevant CV Studies:  ECHO: 01/12/2018  - Left ventricle: The cavity size was normal. Wall thickness was   increased in a pattern of moderate LVH. Systolic function was   mildly reduced. The estimated ejection fraction was in the range   of 45% to 50%. Diffuse hypokinesis. Doppler parameters are   consistent with abnormal left ventricular relaxation (grade 1   diastolic dysfunction). - Aortic valve: Trileaflet; mildly thickened, mildly calcified   leaflets. - Mitral valve: Moderately calcified annulus. - Left atrium: The atrium was mildly dilated.  CATH: 11/01/2016 Diagnostic  Dominance: Right     Laboratory Data:  ChemistryNo results for input(s): NA, K, CL, CO2, GLUCOSE, BUN, CREATININE, CALCIUM, GFRNONAA, GFRAA, ANIONGAP in the last 168 hours.  No  results for input(s): PROT, ALBUMIN, AST, ALT, ALKPHOS, BILITOT in the last 168 hours. Hematology Recent Labs  Lab 11/11/18 0617  WBC 6.6  RBC 4.96  HGB 14.2  HCT 44.4  MCV 89.5  MCH 28.6  MCHC 32.0  RDW 14.4  PLT 164   Cardiac EnzymesNo results for input(s): TROPONINI in the last 168 hours. No results for input(s): TROPIPOC in the last 168 hours.  BNPNo results for input(s): BNP, PROBNP in the last 168 hours.  DDimer No results for input(s): DDIMER in the last 168 hours.  Radiology/Studies:  Dg Chest Port 1 View  Result Date: 11/11/2018 CLINICAL DATA:  Chest pain EXAM: PORTABLE CHEST 1 VIEW COMPARISON:  09/20/2018 FINDINGS: Cardiac shadow is at the upper limits of normal but accentuated by the portable technique. Diffuse aortic calcifications are again identified. The lungs are well aerated bilaterally. Previously seen infiltrative changes have resolved. No sizable effusion is seen. No bony abnormality is noted. IMPRESSION: No acute abnormality seen. Electronically Signed   By: Inez Catalina M.D.   On: 11/11/2018 07:23    Assessment and Plan:   Principal Problem: 1.  STEMI (ST elevation myocardial infarction) [facility]) -The patient was evaluated by Dr. Burt Knack and his daughter was contacted by phone.  - The risks and benefits of a cardiac catheterization including, but not limited to, death, stroke, MI, kidney damage and bleeding were discussed with the patient's daughter who indicates understanding and agrees to proceed.  - His symptoms improved somewhat after aspirin, IV nitro and heparin 4000 units, but he is still having pain. -Patient was taken to the Cath Lab  Otherwise, continue home medications. Active Problems:   End-stage renal disease on hemodialysis (HCC)   Dyslipidemia  Essential hypertension    Severity of Illness: The appropriate patient status for this patient is INPATIENT. Inpatient status is judged to be reasonable and necessary in order to provide the  required intensity of service to ensure the patient's safety. The patient's presenting symptoms, physical exam findings, and initial radiographic and laboratory data in the context of their chronic comorbidities is felt to place them at high risk for further clinical deterioration. Furthermore, it is not anticipated that the patient will be medically stable for discharge from the hospital within 2 midnights of admission. The following factors support the patient status of inpatient.   " The patient's presenting symptoms include chest pain. " The worrisome physical exam findings include abnormal ECG. " The initial radiographic and laboratory data are worrisome because of abnormal ECG. " The chronic co-morbidities include hypertension.   * I certify that at the point of admission it is my clinical judgment that the patient will require inpatient hospital care spanning beyond 2 midnights from the point of admission due to high intensity of service, high risk for further deterioration and high frequency of surveillance required.*    For questions or updates, please contact [facility] Please consult [email or web address] for contact info under Cardiology/STEMI.    Signed, Rosaria Ferries, PA-C  11/11/2018 7:31 AM   Patient seen, examined. Available data reviewed. Agree with findings, assessment, and plan as outlined by Calton Golds, PA-C.  The patient is an elderly appearing male in no distress.  He complains of ongoing chest discomfort that awakened him this morning.  He complains of shortness of breath.  On my exam, he is in no significant distress.  Lung fields are clear, heart is tachycardic and regular with a grade 2/6 ejection murmur at the right upper sternal border with no diastolic murmur.  JVP is normal.  There are bilateral carotid bruits.  Abdomen is soft, thin, nontender.  There are scars over both groins with 2+ pulses and bilateral bruits.  There is no pretibial edema.  There is a left AV  fistula.  The patient has marked ischemic EKG changes and was awakened with chest pain this morning.  He does does not meet criteria for STEMI, but his EKG is suggestive of severe myocardial ischemia potentially left main or multivessel disease.  I went back and reviewed his previous cardiac catheterization films.  He had moderate stenosis of the left circumflex and no other significant disease noted.  I did not feel the patient could provide adequate informed consent for emergency cardiac catheterization which is clearly indicated.  I called his daughter and spoke with her at length.  She confirmed that he would want any potentially life-saving procedures done and would want standard medical care.  I reviewed the fact that his risk of cardiac catheterization is obviously higher than normal considering his vascular disease, previous stroke, and end-stage renal disease.  She understands and provided informed consent over the telephone for emergent cardiac catheterization and possible PCI.  The patient has received IV heparin in the emergency department.  He is hemodynamically stable and further plans/disposition to be determined pending his cardiac catheterization results.  Sherren Mocha, M.D. 11/11/2018 8:14 AM

## 2018-11-12 ENCOUNTER — Inpatient Hospital Stay (HOSPITAL_COMMUNITY): Payer: Medicare Other

## 2018-11-12 DIAGNOSIS — E785 Hyperlipidemia, unspecified: Secondary | ICD-10-CM

## 2018-11-12 DIAGNOSIS — I714 Abdominal aortic aneurysm, without rupture: Secondary | ICD-10-CM

## 2018-11-12 DIAGNOSIS — I214 Non-ST elevation (NSTEMI) myocardial infarction: Secondary | ICD-10-CM

## 2018-11-12 DIAGNOSIS — I10 Essential (primary) hypertension: Secondary | ICD-10-CM

## 2018-11-12 DIAGNOSIS — Z992 Dependence on renal dialysis: Secondary | ICD-10-CM

## 2018-11-12 DIAGNOSIS — N186 End stage renal disease: Secondary | ICD-10-CM

## 2018-11-12 LAB — BASIC METABOLIC PANEL
Anion gap: 16 — ABNORMAL HIGH (ref 5–15)
BUN: 40 mg/dL — ABNORMAL HIGH (ref 8–23)
CO2: 29 mmol/L (ref 22–32)
Calcium: 9 mg/dL (ref 8.9–10.3)
Chloride: 93 mmol/L — ABNORMAL LOW (ref 98–111)
Creatinine, Ser: 7.55 mg/dL — ABNORMAL HIGH (ref 0.61–1.24)
GFR calc Af Amer: 7 mL/min — ABNORMAL LOW (ref >60)
GFR calc non Af Amer: 6 mL/min — ABNORMAL LOW (ref >60)
Glucose, Bld: 78 mg/dL (ref 70–99)
Potassium: 5.8 mmol/L — ABNORMAL HIGH (ref 3.5–5.1)
Sodium: 138 mmol/L (ref 135–145)

## 2018-11-12 LAB — CBC
HEMATOCRIT: 39.7 % (ref 39.0–52.0)
Hemoglobin: 12.5 g/dL — ABNORMAL LOW (ref 13.0–17.0)
MCH: 27.4 pg (ref 26.0–34.0)
MCHC: 31.5 g/dL (ref 30.0–36.0)
MCV: 87.1 fL (ref 80.0–100.0)
Platelets: 186 10*3/uL (ref 150–400)
RBC: 4.56 MIL/uL (ref 4.22–5.81)
RDW: 14.3 % (ref 11.5–15.5)
WBC: 5.7 10*3/uL (ref 4.0–10.5)
nRBC: 0 % (ref 0.0–0.2)

## 2018-11-12 LAB — HEPARIN LEVEL (UNFRACTIONATED)
Heparin Unfractionated: 0.17 IU/mL — ABNORMAL LOW (ref 0.30–0.70)
Heparin Unfractionated: 0.35 IU/mL (ref 0.30–0.70)
Heparin Unfractionated: 0.56 IU/mL (ref 0.30–0.70)

## 2018-11-12 LAB — ECHOCARDIOGRAM COMPLETE: Weight: 2003.54 oz

## 2018-11-12 LAB — TROPONIN I: Troponin I: 10.59 ng/mL (ref <0.03)

## 2018-11-12 MED ORDER — METOPROLOL TARTRATE 12.5 MG HALF TABLET
12.5000 mg | ORAL_TABLET | Freq: Two times a day (BID) | ORAL | Status: DC
  Administered 2018-11-12 – 2018-11-15 (×6): 12.5 mg via ORAL
  Filled 2018-11-12 (×6): qty 1

## 2018-11-12 MED ORDER — ISOSORBIDE MONONITRATE ER 60 MG PO TB24
60.0000 mg | ORAL_TABLET | Freq: Every day | ORAL | Status: DC
  Administered 2018-11-13 – 2018-11-15 (×3): 60 mg via ORAL
  Filled 2018-11-12 (×3): qty 1

## 2018-11-12 MED ORDER — NEPRO/CARBSTEADY PO LIQD
237.0000 mL | Freq: Two times a day (BID) | ORAL | Status: DC
  Administered 2018-11-13 – 2018-11-15 (×4): 237 mL via ORAL
  Filled 2018-11-12 (×3): qty 237

## 2018-11-12 NOTE — Progress Notes (Signed)
ANTICOAGULATION CONSULT NOTE   Pharmacy Consult for Heparin Indication: chest pain/ACS  No Known Allergies  Patient Measurements:   Heparin Dosing Weight: 55 kg   Vital Signs: Temp: 98.4 F (36.9 C) (12/13 0003) Temp Source: Oral (12/13 0003) BP: 115/59 (12/13 0100) Pulse Rate: 74 (12/13 0100)  Labs: Recent Labs    11/11/18 0617 11/11/18 0757 11/11/18 1053 11/11/18 1338 11/12/18 0008  HGB 14.2 13.6  --   --  12.5*  HCT 44.4 40.0  --   --  39.7  PLT 164  --   --   --  186  APTT 33  --   --   --   --   LABPROT 13.5  --   --   --   --   INR 1.04  --   --   --   --   HEPARINUNFRC  --   --   --   --  0.17*  CREATININE 5.82* 5.20*  --   --  7.55*  TROPONINI 0.04*  --  2.71* 8.21* 10.59*    CrCl cannot be calculated (Unknown ideal weight.).   Medical History: Past Medical History:  Diagnosis Date  . AAA (abdominal aortic aneurysm) ([place])   . Anemia   . Anxiety   . Arthritis    arms and back  . Blindness   . CHF (congestive heart failure) ([place])   . Coronary artery disease    mild   . 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 ESRD on hemodialysis ([place])    started HD 2014-15  . Headache(784.0)   . HTN (hypertension)   . Protein-calorie malnutrition ([place])   . STEMI (ST elevation myocardial infarction) ([place]) 11/11/2018  . Stroke [facility]) 01/2018   no residual deficits    Medications:  Medications Prior to Admission  Medication Sig Dispense Refill Last Dose  . amLODipine (NORVASC) 10 MG tablet Take 10 mg by mouth at bedtime.   0 09/19/2018 at Unknown time  . aspirin 81 MG chewable tablet Chew 1 tablet (81 mg total) by mouth daily.   09/19/2018 at Unknown time  . atorvastatin (LIPITOR) 40 MG tablet Take 40 mg by mouth at bedtime.   09/19/2018 at Unknown time  . bisacodyl (DULCOLAX) 10 MG suppository Place 10 mg rectally daily as needed (constipation).    unknown  . cinacalcet (SENSIPAR) 90 MG tablet Take 90 mg by mouth  daily with supper.   09/19/2018 at Unknown time  . clopidogrel (PLAVIX) 75 MG tablet Take 1 tablet (75 mg total) by mouth daily. 30 tablet 2 09/19/2018 at Unknown time  . docusate sodium (COLACE) 100 MG capsule Take 200 mg by mouth at bedtime.    09/19/2018 at Unknown time  . doxercalciferol (HECTOROL) 4 MCG/2ML injection Inject 1 mL (2 mcg total) into the vein every Monday, Wednesday, and Friday with hemodialysis. (Patient not taking: Reported on 09/20/2018) 2 mL  Not Taking at Unknown time  . ferric citrate (AURYXIA) 1 GM 210 MG(Fe) tablet Take 630 mg by mouth 3 (three) times daily with meals.    09/19/2018 at Unknown time  . gabapentin (NEURONTIN) 300 MG capsule Take 1 capsule (300 mg total) by mouth at bedtime. 30 capsule 0 09/19/2018 at Unknown time  . Glycopyrrolate (LONHALA MAGNAIR REFILL KIT) 25 MCG/ML SOLN Inhale 25 mcg into the lungs 2 (two) times daily.   09/19/2018 at Unknown time  . hydrALAZINE (APRESOLINE) 50  MG tablet Take 50 mg by mouth 3 (three) times daily.   09/19/2018 at Unknown time  . HYDROcodone-acetaminophen (NORCO) 5-325 MG tablet Take 1 tablet by mouth every 6 (six) hours as needed for moderate pain. (Patient not taking: Reported on 09/20/2018) 12 tablet 0 Not Taking at Unknown time  . Icosapent Ethyl (VASCEPA) 1 g CAPS Take 1 g by mouth 2 (two) times daily.    09/19/2018 at Unknown time  . ipratropium-albuterol (DUONEB) 0.5-2.5 (3) MG/3ML SOLN Take 3 mLs by nebulization every 8 (eight) hours as needed (for copd).   unknown  . isosorbide mononitrate (IMDUR) 30 MG 24 hr tablet Take 30 mg by mouth daily.   09/19/2018 at Unknown time  . metoprolol tartrate (LOPRESSOR) 25 MG tablet Take 1 tablet (25 mg total) by mouth 2 (two) times daily. (Patient not taking: Reported on 09/20/2018) 60 tablet 5 Not Taking at Unknown time  . Nutritional Supplements (FEEDING SUPPLEMENT, NEPRO CARB STEADY,) LIQD Take 237 mLs by mouth 2 (two) times daily between meals.  0 09/19/2018 at Unknown time  .  ondansetron (ZOFRAN) 4 MG tablet Take 4 mg by mouth every 6 (six) hours as needed for nausea or vomiting.   unknown  . senna (SENOKOT) 8.6 MG TABS tablet Take 1 tablet by mouth daily.   09/19/2018 at Unknown time    Assessment: 12 YOM w/ h/o CAD s/p CATH in 2017 here with chest pain. Unable to perform cardiac catheterization. Pharmacy consulted to dose IV heparin to help with medical management. Heparin to start 8 hours after sheath pull. H/H and Plt wnl.  Initial heparin level 0.17 units/ml  Goal of Therapy:  Heparin level 0.3-0.7 units/ml Monitor platelets by anticoagulation protocol: Yes   Plan:  Increase heparin 850 units/hr  8 hr HL Monitor daily HL, CBC and s/s of bleeding   Thanks for allowing pharmacy to be a part of this patient's care.  Excell Seltzer, PharmD Clinical Pharmacist

## 2018-11-12 NOTE — Progress Notes (Addendum)
Initial Nutrition Assessment  DOCUMENTATION CODES:   Severe malnutrition in context of chronic illness, Underweight  INTERVENTION:    Nepro Shake po BID, each supplement provides 425 kcal and 19 grams protein  NUTRITION DIAGNOSIS:   Severe Malnutrition related to chronic illness(ESRD on HD, CAD, CHF) as evidenced by severe fat depletion, severe muscle depletion  GOAL:   Patient will meet greater than or equal to 90% of their needs  MONITOR:   PO intake, Supplement acceptance, Labs, Skin, Weight trends, I & O's  REASON FOR ASSESSMENT:   Malnutrition Screening Tool  ASSESSMENT:   72 yo Male with PMH of AAA, ESRD on hemodialysis, CAD, CHF, stroke who comes in with chief complaint of chest pain.   Pt admitted from [facility] as Code STEMI.  RD spoke with pt at bedside. He reports he didn't eat as much as he would've liked to of breakfast. Pt needs assistance with meals as he can't use one of his arms as well.  Pt with ESRD on HD 3 times per week (MWF). He received Nepro Shake during his sessions; likes. Labs & medications reviewed.  Pt shares he feels he's lost weight. Unable to identify amount or time frame. He has a hx of malnutrition; ongoing.  NUTRITION - FOCUSED PHYSICAL EXAM:    Most Recent Value  Orbital Region  Moderate depletion  Upper Arm Region  Severe depletion  Thoracic and Lumbar Region  Unable to assess  Buccal Region  Moderate depletion  Temple Region  Moderate depletion  Clavicle Bone Region  Severe depletion  Clavicle and Acromion Bone Region  Severe depletion  Scapular Bone Region  Moderate depletion  Dorsal Hand  Moderate depletion  Patellar Region  Severe depletion  Anterior Thigh Region  Severe depletion  Posterior Calf Region  Severe depletion  Edema (RD Assessment)  None     Diet Order:   Diet Order            Diet Heart Room service appropriate? Yes; Fluid consistency: Thin  Diet effective now             EDUCATION  NEEDS:   No education needs have been identified at this time  Skin:  Skin Assessment: Reviewed RN Assessment  Last BM:  12/9   Intake/Output Summary (Last 24 hours) at 11/12/2018 1109 Last data filed at 11/12/2018 1100 Gross per 24 hour  Intake 559.92 ml  Output -  Net 559.92 ml   Height:   Ht Readings from Last 1 Encounters:  11/12/18 5\' 10"  (1.778 m)   Weight:   Wt Readings from Last 1 Encounters:  11/12/18 56.8 kg   BMI:  Body mass index is 17.97 kg/m.  Estimated Nutritional Needs:   Kcal:  1700-1900  Protein:  80-95 gm  Fluid:  1,000 ml + UOP  Arthur Holms, RD, LDN Pager #: [phone] After-Hours Pager #: [phone]

## 2018-11-12 NOTE — Progress Notes (Signed)
[id number] Did not attempt ambulation or give ex ed due to wheelchair bound. Referred pt to [place] CRP 2 due to MI but mobility issues will not allow pt to participate. Discussed with pt how to use NTG. Pt stated he needs NTG prescription at discharge. Refer to HD dietitian for diet ed. Stressed importance of taking plavix. Pt stated he is given his meds and he takes them as given.  Discusses smoking cessation and left handout. Pt stated he is ready to quit.  Will sign off as appropriate ed completed with pt. Graylon Good RN BSN 11/12/2018 12:26 PM

## 2018-11-12 NOTE — Progress Notes (Signed)
ANTICOAGULATION CONSULT NOTE   Pharmacy Consult for Heparin Indication: chest pain/ACS  No Known Allergies  Patient Measurements: Height: _0  (177.8 cm) Weight: 123 lb 0.3 oz (55.8 kg) IBW/kg (Calculated) : 73 Heparin Dosing Weight: 55 kg   Vital Signs: Temp: 97.5 F (36.4 C) (12/13 2005) Temp Source: Oral (12/13 2005) BP: 124/75 (12/13 1923) Pulse Rate: 80 (12/13 1923)  Labs: Recent Labs    11/11/18 0617 11/11/18 0757 11/11/18 1053 11/11/18 1338 11/12/18 0008 11/12/18 1032 11/12/18 2002  HGB 14.2 13.6  --   --  12.5*  --   --   HCT 44.4 40.0  --   --  39.7  --   --   PLT 164  --   --   --  186  --   --   APTT 33  --   --   --   --   --   --   LABPROT 13.5  --   --   --   --   --   --   INR 1.04  --   --   --   --   --   --   HEPARINUNFRC  --   --   --   --  0.17* 0.35 0.56  CREATININE 5.82* 5.20*  --   --  7.55*  --   --   TROPONINI 0.04*  --  2.71* 8.21* 10.59*  --   --     Estimated Creatinine Clearance: 6.9 mL/min (A) (by C-G formula based on SCr of 7.55 mg/dL (H)).   Medical History: Past Medical History:  Diagnosis Date  . AAA (abdominal aortic aneurysm) ([place])   . Anemia   . Anxiety   . Arthritis    arms and back  . Blindness   . CHF (congestive heart failure) ([place])   . Coronary artery disease    mild   . 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 ESRD on hemodialysis ([place])    started HD 2014-15  . Headache(784.0)   . HTN (hypertension)   . Protein-calorie malnutrition ([place])   . STEMI (ST elevation myocardial infarction) ([place]) 11/11/2018  . Stroke [facility]) 01/2018   no residual deficits    Medications:  Medications Prior to Admission  Medication Sig Dispense Refill Last Dose  . amLODipine (NORVASC) 10 MG tablet Take 10 mg by mouth at bedtime.   0 11/10/2018  . aspirin 81 MG chewable tablet Chew 1 tablet (81 mg total) by mouth daily.   11/11/2018 at 0900  . atorvastatin (LIPITOR) 40 MG tablet Take  40 mg by mouth at bedtime.   11/10/2018  . cinacalcet (SENSIPAR) 90 MG tablet Take 90 mg by mouth daily with supper.   11/10/2018  . clopidogrel (PLAVIX) 75 MG tablet Take 1 tablet (75 mg total) by mouth daily. 30 tablet 2 11/11/2018 at 0900  . docusate sodium (COLACE) 100 MG capsule Take 200 mg by mouth at bedtime.    11/10/2018  . ferric citrate (AURYXIA) 1 GM 210 MG(Fe) tablet Take 630 mg by mouth 3 (three) times daily with meals.    11/11/2018 at Unknown time  . gabapentin (NEURONTIN) 300 MG capsule Take 1 capsule (300 mg total) by mouth at bedtime. 30 capsule 0 11/10/2018  . Glycopyrrolate (LONHALA MAGNAIR REFILL KIT) 25 MCG/ML SOLN Inhale 25 mcg into the lungs 2 (two) times daily.   11/11/2018 at Unknown time  .  hydrALAZINE (APRESOLINE) 50 MG tablet Take 50 mg by mouth 3 (three) times daily.   11/11/2018 at Unknown time  . HYDROcodone-acetaminophen (NORCO) 5-325 MG tablet Take 1 tablet by mouth every 6 (six) hours as needed for moderate pain. 12 tablet 0 11/09/2018  . Icosapent Ethyl (VASCEPA) 1 g CAPS Take 1 g by mouth 2 (two) times daily.    11/11/2018 at Unknown time  . isosorbide mononitrate (IMDUR) 30 MG 24 hr tablet Take 30 mg by mouth daily.   11/11/2018 at Unknown time  . metoprolol tartrate (LOPRESSOR) 25 MG tablet Take 1 tablet (25 mg total) by mouth 2 (two) times daily. 60 tablet 5 11/11/2018 at Unknown time  . mirtazapine (REMERON) 7.5 MG tablet Take 7.5 mg by mouth at bedtime.   11/10/2018  . Nutritional Supplements (ENSURE PO) Take 240 mLs by mouth daily.   11/11/2018 at Unknown time  . senna (SENOKOT) 8.6 MG TABS tablet Take 1 tablet by mouth daily.   11/11/2018 at Unknown time  . bisacodyl (DULCOLAX) 10 MG suppository Place 10 mg rectally daily as needed (constipation).    UNK  . doxercalciferol (HECTOROL) 4 MCG/2ML injection Inject 1 mL (2 mcg total) into the vein every Monday, Wednesday, and Friday with hemodialysis. (Patient not taking: Reported on 09/20/2018) 2 mL  Not  Taking at Unknown time  . ipratropium-albuterol (DUONEB) 0.5-2.5 (3) MG/3ML SOLN Take 3 mLs by nebulization every 8 (eight) hours as needed (for copd).   UNK  . Nutritional Supplements (FEEDING SUPPLEMENT, NEPRO CARB STEADY,) LIQD Take 237 mLs by mouth 2 (two) times daily between meals. (Patient not taking: Reported on 11/12/2018)  0 Not Taking at Unknown time  . ondansetron (ZOFRAN) 4 MG tablet Take 4 mg by mouth every 6 (six) hours as needed for nausea or vomiting.   UNK    Assessment: 79 YOM w/ h/o CAD s/p CATH in 2017 here with chest pain. Unable to perform cardiac catheterization. Pharmacy consulted to dose IV heparin to help with medical management. Currently on IV heparin at 850 units/hr and confirmed at goal.   Goal of Therapy:  Heparin level 0.3-0.7 units/ml Monitor platelets by anticoagulation protocol: Yes   Plan:   Continue heparin at 850 units/hr  Monitor daily HL, CBC and s/s of bleeding   Thanks for allowing pharmacy to be a part of this patient's care.Hildred Laser, PharmD Clinical Pharmacist **Pharmacist phone directory can now be found on [email or web address] ([email or web address]).  Listed under [place].

## 2018-11-12 NOTE — Progress Notes (Signed)
1        Progress Note  Patient Name: Trevor [name] Date of Encounter: 11/12/2018  Primary Cardiologist: Kirk Ruths, MD   Subjective   He feels well this morning.  Denies chest pain.  Discussed with the patient the inability to gain any cardiovascular information by cath due to absence of access sites.  Denies dyspnea.  To have dialysis today.  Inpatient Medications    Scheduled Meds: . atorvastatin  80 mg Oral q1800  . Chlorhexidine Gluconate Cloth  6 each Topical Q0600  . clopidogrel  75 mg Oral Q breakfast  . doxercalciferol  1 mcg Intravenous Q M,W,F-HD  . sodium chloride flush  3 mL Intravenous Q12H  . sodium chloride flush  3 mL Intravenous Q12H   Continuous Infusions: . sodium chloride 10 mL/hr at 11/11/18 1400  . sodium chloride    . sodium chloride    . heparin 850 Units/hr (11/12/18 0900)  . nitroGLYCERIN Stopped (11/11/18 0800)   PRN Meds: sodium chloride, sodium chloride, acetaminophen, ALPRAZolam, nitroGLYCERIN, ondansetron (ZOFRAN) IV, oxyCODONE, sodium chloride flush, sodium chloride flush, zolpidem   Vital Signs    Vitals:   11/12/18 0500 11/12/18 0600 11/12/18 0700 11/12/18 0900  BP: 131/67 (!) 141/54 (!) 155/68 125/66  Pulse: 64 (!) 55 81 68  Resp: (!) 36 (!) 45 (!) 72 (!) 49  Temp:   98.1 F (36.7 C)   TempSrc:   Oral   SpO2: 100% 98% 98% 97%  Weight: 56.8 kg       Intake/Output Summary (Last 24 hours) at 11/12/2018 0949 Last data filed at 11/12/2018 0900 Gross per 24 hour  Intake 782.92 ml  Output -  Net 782.92 ml   Filed Weights   11/12/18 0500  Weight: 56.8 kg    Telemetry    Normal sinus rhythm- Personally Reviewed  ECG    Marked precordial deep T wave inversion also involves lead I and aVL.- Personally Reviewed  Physical Exam  Frail appearing African-American male. GEN: No acute distress.   Neck:  Mild JVD. Cardiac: RRR.  S4 gallop audible. Respiratory: Clear to auscultation bilaterally. GI: Soft, nontender,  non-distended  MS: No edema; No deformity. Neuro:  Nonfocal  Psych: Normal affect   Labs    Chemistry Recent Labs  Lab 11/11/18 0617 11/11/18 0757 11/12/18 0008  NA 137 138 138  K 6.2* 4.7 5.8*  CL 91* 97* 93*  CO2 26  --  29  GLUCOSE 65* 78 78  BUN 20 20 40*  CREATININE 5.82* 5.20* 7.55*  CALCIUM 9.6  --  9.0  PROT 7.9  --   --   ALBUMIN 3.6  --   --   AST 26  --   --   ALT 12  --   --   ALKPHOS 93  --   --   BILITOT 1.2  --   --   GFRNONAA 9*  --  6*  GFRAA 10*  --  7*  ANIONGAP 20*  --  16*     Hematology Recent Labs  Lab 11/11/18 0617 11/11/18 0757 11/12/18 0008  WBC 6.6  --  5.7  RBC 4.96  --  4.56  HGB 14.2 13.6 12.5*  HCT 44.4 40.0 39.7  MCV 89.5  --  87.1  MCH 28.6  --  27.4  MCHC 32.0  --  31.5  RDW 14.4  --  14.3  PLT 164  --  186    Cardiac Enzymes Recent Labs  Lab 11/11/18 0617 11/11/18 1053 11/11/18 1338 11/12/18 0008  TROPONINI 0.04* 2.71* 8.21* 10.59*   No results for input(s): TROPIPOC in the last 168 hours.   BNPNo results for input(s): BNP, PROBNP in the last 168 hours.   DDimer No results for input(s): DDIMER in the last 168 hours.   Radiology    Dg Chest Port 1 View  Result Date: 11/11/2018 CLINICAL DATA:  Chest pain EXAM: PORTABLE CHEST 1 VIEW COMPARISON:  09/20/2018 FINDINGS: Cardiac shadow is at the upper limits of normal but accentuated by the portable technique. Diffuse aortic calcifications are again identified. The lungs are well aerated bilaterally. Previously seen infiltrative changes have resolved. No sizable effusion is seen. No bony abnormality is noted. IMPRESSION: No acute abnormality seen. Electronically Signed   By: Inez Catalina M.D.   On: 11/11/2018 07:23    Cardiac Studies    Cath attempted but because of lack of vascular access, procedure was aborted.  2D Doppler echocardiogram February 2019: Study Conclusions  - Left ventricle: The cavity size was normal. Wall thickness was   increased in a  pattern of moderate LVH. Systolic function was   mildly reduced. The estimated ejection fraction was in the range   of 45% to 50%. Diffuse hypokinesis. Doppler parameters are   consistent with abnormal left ventricular relaxation (grade 1   diastolic dysfunction). - Aortic valve: Trileaflet; mildly thickened, mildly calcified   leaflets. - Mitral valve: Moderately calcified annulus. - Left atrium: The atrium was mildly dilated.  Patient Profile     72 y.o. 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  Acute non-ST elevation myocardial infarction 11/11/2018 with inability to treat invasively due to lack of access.   Assessment & Plan    1. Coronary atherosclerotic heart disease with acute non-ST elevation myocardial infarction 11/11/2018 without ability to intervene due to lack of vascular access.  Plan is medical therapy as tolerated.  Medical therapy will be limited by blood pressure in the setting of chronic kidney disease and dialysis. 2. Acute on chronic combined systolic and diastolic heart failure: Dialysis will be the major mode of therapy to maintain intravascular volume.  We will consider adding guideline directed therapy but it will depend upon hemodynamics associated with dialysis which is essential.  We will repeat his echocardiogram to assess LV function post completed infarct. 3. PAD with axillobifemoral bypass recently. 4. Hypertension -plan medical therapy to help control along with dialysis. 5. Hyperlipidemia -LDL less than 70 will be target.  Overall poor prognosis with diffuse vascular disease, interventional therapy not possible because of lack of access, chronic dialysis, needs echo to assess LV residual function, probably not a good candidate for beta-blocker therapy and or angiotensin receptor/ACE.  We will add long-acting nitrate around dialysis.  For questions or updates,  please contact [place] Please consult [email or web address] for contact info under Cardiology/STEMI.      Signed, Sinclair Grooms, MD  11/12/2018, 9:49 AM

## 2018-11-12 NOTE — Progress Notes (Signed)
Pt refused turn at 0000.... Sts he can turn himself and doesn't want to be turned for the rest of the night.... Just wants to rest.   A&O x4... no acute distress... resting comfortable and pt is able to turn as he demonstrated.

## 2018-11-12 NOTE — Progress Notes (Signed)
[facility] Progress Note  Subjective: alert, no CP  Vitals:   11/12/18 0900 11/12/18 1000 11/12/18 1100 11/12/18 1106  BP: 125/66 (!) 143/52 (!) 165/74   Pulse: 68 81 60   Resp: (!) 49 (!) 22 (!) 40   Temp:      TempSrc:      SpO2: 97% 100% 100%   Weight:    56.8 kg  Height:    5\' 10"  (1.778 m)    Inpatient medications: . atorvastatin  80 mg Oral q1800  . Chlorhexidine Gluconate Cloth  6 each Topical Q0600  . clopidogrel  75 mg Oral Q breakfast  . doxercalciferol  1 mcg Intravenous Q M,W,F-HD  . feeding supplement (NEPRO CARB STEADY)  237 mL Oral BID BM  . isosorbide mononitrate  60 mg Oral Daily  . metoprolol tartrate  12.5 mg Oral BID  . sodium chloride flush  3 mL Intravenous Q12H  . sodium chloride flush  3 mL Intravenous Q12H   . sodium chloride 10 mL/hr at 11/11/18 1400  . sodium chloride    . sodium chloride    . heparin 850 Units/hr (11/12/18 1100)   sodium chloride, sodium chloride, acetaminophen, ALPRAZolam, nitroGLYCERIN, ondansetron (ZOFRAN) IV, oxyCODONE, sodium chloride flush, sodium chloride flush, zolpidem  Iron/TIBC/Ferritin/ %Sat    Component Value Date/Time   IRON 46 03/30/2018 0343   TIBC 193 (L) 03/30/2018 0343   FERRITIN 1,290 (H) 03/30/2018 0343   IRONPCTSAT 24 03/30/2018 0343    Exam: General: Frail man, NAD Neck: Supple without lymphadenopathy/masses. JVD not elevated. Lungs: Clear bilaterally to auscultation without wheezes, rales, or rhonchi Heart: RRR with normal S1, S2. No murmurs, rubs, or gallops appreciated. Abdomen: Soft, non-tender, non-distended with normoactive bowel sounds.  Lower extremities: No edema or ischemic changes, no open wounds. Neuro: Alert and oriented X 3. Moves all extremities spontaneously. Dialysis Access: L AVF + thrill  Dialysis Orders:  MWF at [facility] 4hr, 400/800, EDW 56kg, 2K/2.25Ca, UFP #4, AVF, no heparin - Hectoral 51mcg IV q HD  Assessment/Plan: 1.  CAD/ACS/diffuse ST depression:  failed LHC attempt , could not get access due to severe PAD. Treating medically. Trop ^ 10.5. CP resolved, on IV heparin. Per primary team.  2.  ESRD: HD MWF.  HD today.  3.  Hypertension/volume: BP low/stable. Low UF goal tpdau 4.  Anemia: Hgb 13.6. No ESA needed. 5.  Metabolic bone disease: Ca ok, Phos pending. Resume Hectoral with HD tomorrow. 6.  Hx AAA 7.  Hx CVA 8.  Blindness   Kelly Splinter MD [facility] pager [phone]   11/12/2018, 11:53 AM   Recent Labs  Lab 11/11/18 0617 11/11/18 0757 11/12/18 0008  NA 137 138 138  K 6.2* 4.7 5.8*  CL 91* 97* 93*  CO2 26  --  29  GLUCOSE 65* 78 78  BUN 20 20 40*  CREATININE 5.82* 5.20* 7.55*  CALCIUM 9.6  --  9.0  ALBUMIN 3.6  --   --   INR 1.04  --   --    Recent Labs  Lab 11/11/18 0617  AST 26  ALT 12  ALKPHOS 93  BILITOT 1.2  PROT 7.9   Recent Labs  Lab 11/11/18 0617 11/11/18 0757 11/12/18 0008  WBC 6.6  --  5.7  NEUTROABS 3.6  --   --   HGB 14.2 13.6 12.5*  HCT 44.4 40.0 39.7  MCV 89.5  --  87.1  PLT 164  --  186

## 2018-11-12 NOTE — Plan of Care (Signed)
  Problem: Education: Goal: Knowledge of General Education information will improve Description Including pain rating scale, medication(s)/side effects and non-pharmacologic comfort measures Outcome: Progressing   Problem: Clinical Measurements: Goal: Will remain free from infection Outcome: Progressing Goal: Respiratory complications will improve Outcome: Progressing   Problem: Nutrition: Goal: Adequate nutrition will be maintained Outcome: Progressing   Problem: Coping: Goal: Level of anxiety will decrease Outcome: Progressing   Problem: Pain Managment: Goal: General experience of comfort will improve Outcome: Progressing   Problem: Safety: Goal: Ability to remain free from injury will improve Outcome: Progressing   Problem: Skin Integrity: Goal: Risk for impaired skin integrity will decrease Outcome: Progressing

## 2018-11-12 NOTE — Progress Notes (Signed)
ANTICOAGULATION CONSULT NOTE   Pharmacy Consult for Heparin Indication: chest pain/ACS  No Known Allergies  Patient Measurements: Height: '5\' 10"'$  (177.8 cm) Weight: 125 lb 3.5 oz (56.8 kg) IBW/kg (Calculated) : 73 Heparin Dosing Weight: 55 kg   Vital Signs: Temp: 98.1 F (36.7 C) (12/13 0700) Temp Source: Oral (12/13 0700) BP: 165/74 (12/13 1100) Pulse Rate: 60 (12/13 1100)  Labs: Recent Labs    11/11/18 0617 11/11/18 0757 11/11/18 1053 11/11/18 1338 11/12/18 0008 11/12/18 1032  HGB 14.2 13.6  --   --  12.5*  --   HCT 44.4 40.0  --   --  39.7  --   PLT 164  --   --   --  186  --   APTT 33  --   --   --   --   --   LABPROT 13.5  --   --   --   --   --   INR 1.04  --   --   --   --   --   HEPARINUNFRC  --   --   --   --  0.17* 0.35  CREATININE 5.82* 5.20*  --   --  7.55*  --   TROPONINI 0.04*  --  2.71* 8.21* 10.59*  --     Estimated Creatinine Clearance: 7 mL/min (A) (by C-G formula based on SCr of 7.55 mg/dL (H)).   Medical History: Past Medical History:  Diagnosis Date  . AAA (abdominal aortic aneurysm) ([place])   . Anemia   . Anxiety   . Arthritis    arms and back  . Blindness   . CHF (congestive heart failure) ([place])   . Coronary artery disease    mild   . 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 ESRD on hemodialysis ([place])    started HD 2014-15  . Headache(784.0)   . HTN (hypertension)   . Protein-calorie malnutrition ([place])   . STEMI (ST elevation myocardial infarction) ([place]) 11/11/2018  . Stroke [facility]) 01/2018   no residual deficits    Medications:  Medications Prior to Admission  Medication Sig Dispense Refill Last Dose  . amLODipine (NORVASC) 10 MG tablet Take 10 mg by mouth at bedtime.   0 09/19/2018 at Unknown time  . aspirin 81 MG chewable tablet Chew 1 tablet (81 mg total) by mouth daily.   09/19/2018 at Unknown time  . atorvastatin (LIPITOR) 40 MG tablet Take 40 mg by mouth at bedtime.    09/19/2018 at Unknown time  . bisacodyl (DULCOLAX) 10 MG suppository Place 10 mg rectally daily as needed (constipation).    unknown  . cinacalcet (SENSIPAR) 90 MG tablet Take 90 mg by mouth daily with supper.   09/19/2018 at Unknown time  . clopidogrel (PLAVIX) 75 MG tablet Take 1 tablet (75 mg total) by mouth daily. 30 tablet 2 09/19/2018 at Unknown time  . docusate sodium (COLACE) 100 MG capsule Take 200 mg by mouth at bedtime.    09/19/2018 at Unknown time  . doxercalciferol (HECTOROL) 4 MCG/2ML injection Inject 1 mL (2 mcg total) into the vein every Monday, Wednesday, and Friday with hemodialysis. (Patient not taking: Reported on 09/20/2018) 2 mL  Not Taking at Unknown time  . ferric citrate (AURYXIA) 1 GM 210 MG(Fe) tablet Take 630 mg by mouth 3 (three) times daily with meals.    09/19/2018 at Unknown time  . gabapentin (NEURONTIN) 300  MG capsule Take 1 capsule (300 mg total) by mouth at bedtime. 30 capsule 0 09/19/2018 at Unknown time  . Glycopyrrolate (LONHALA MAGNAIR REFILL KIT) 25 MCG/ML SOLN Inhale 25 mcg into the lungs 2 (two) times daily.   09/19/2018 at Unknown time  . hydrALAZINE (APRESOLINE) 50 MG tablet Take 50 mg by mouth 3 (three) times daily.   09/19/2018 at Unknown time  . HYDROcodone-acetaminophen (NORCO) 5-325 MG tablet Take 1 tablet by mouth every 6 (six) hours as needed for moderate pain. (Patient not taking: Reported on 09/20/2018) 12 tablet 0 Not Taking at Unknown time  . Icosapent Ethyl (VASCEPA) 1 g CAPS Take 1 g by mouth 2 (two) times daily.    09/19/2018 at Unknown time  . ipratropium-albuterol (DUONEB) 0.5-2.5 (3) MG/3ML SOLN Take 3 mLs by nebulization every 8 (eight) hours as needed (for copd).   unknown  . isosorbide mononitrate (IMDUR) 30 MG 24 hr tablet Take 30 mg by mouth daily.   09/19/2018 at Unknown time  . metoprolol tartrate (LOPRESSOR) 25 MG tablet Take 1 tablet (25 mg total) by mouth 2 (two) times daily. (Patient not taking: Reported on 09/20/2018) 60 tablet  5 Not Taking at Unknown time  . Nutritional Supplements (FEEDING SUPPLEMENT, NEPRO CARB STEADY,) LIQD Take 237 mLs by mouth 2 (two) times daily between meals.  0 09/19/2018 at Unknown time  . ondansetron (ZOFRAN) 4 MG tablet Take 4 mg by mouth every 6 (six) hours as needed for nausea or vomiting.   unknown  . senna (SENOKOT) 8.6 MG TABS tablet Take 1 tablet by mouth daily.   09/19/2018 at Unknown time    Assessment: 76 YOM w/ h/o CAD s/p CATH in 2017 here with chest pain. Unable to perform cardiac catheterization. Pharmacy consulted to dose IV heparin to help with medical management. Currently on IV heparin at 850 units/hr. HL is now therapeutic at 0.35.   Goal of Therapy:  Heparin level 0.3-0.7 units/ml Monitor platelets by anticoagulation protocol: Yes   Plan:  Continue heparin at 850 units/hr  Confirmatory 8 hr HL Monitor daily HL, CBC and s/s of bleeding   Thanks for allowing pharmacy to be a part of this patient's care.  Albertina Parr, PharmD., BCPS Clinical Pharmacist Clinical phone for 11/12/18 until 3:30pm: [phone] If after 3:30pm, please refer to [facility] for unit-specific pharmacist

## 2018-11-12 NOTE — Progress Notes (Signed)
  Echocardiogram 2D Echocardiogram has been performed.  Trevor [name] [name] 11/12/2018, 10:37 AM

## 2018-11-13 DIAGNOSIS — I209 Angina pectoris, unspecified: Secondary | ICD-10-CM

## 2018-11-13 LAB — CBC
HCT: 39.5 % (ref 39.0–52.0)
Hemoglobin: 12.8 g/dL — ABNORMAL LOW (ref 13.0–17.0)
MCH: 27.8 pg (ref 26.0–34.0)
MCHC: 32.4 g/dL (ref 30.0–36.0)
MCV: 85.7 fL (ref 80.0–100.0)
NRBC: 0 % (ref 0.0–0.2)
Platelets: 145 10*3/uL — ABNORMAL LOW (ref 150–400)
RBC: 4.61 MIL/uL (ref 4.22–5.81)
RDW: 14 % (ref 11.5–15.5)
WBC: 5.2 10*3/uL (ref 4.0–10.5)

## 2018-11-13 LAB — BASIC METABOLIC PANEL
ANION GAP: 11 (ref 5–15)
ANION GAP: 14 (ref 5–15)
BUN: 17 mg/dL (ref 8–23)
BUN: 24 mg/dL — ABNORMAL HIGH (ref 8–23)
CO2: 30 mmol/L (ref 22–32)
CO2: 27 mmol/L (ref 22–32)
Calcium: 9.1 mg/dL (ref 8.9–10.3)
Calcium: 9.4 mg/dL (ref 8.9–10.3)
Chloride: 95 mmol/L — ABNORMAL LOW (ref 98–111)
Chloride: 95 mmol/L — ABNORMAL LOW (ref 98–111)
Creatinine, Ser: 4.94 mg/dL — ABNORMAL HIGH (ref 0.61–1.24)
Creatinine, Ser: 5.83 mg/dL — ABNORMAL HIGH (ref 0.61–1.24)
GFR calc Af Amer: 12 mL/min — ABNORMAL LOW (ref >60)
GFR calc Af Amer: 10 mL/min — ABNORMAL LOW (ref >60)
GFR calc non Af Amer: 11 mL/min — ABNORMAL LOW (ref >60)
GFR calc non Af Amer: 9 mL/min — ABNORMAL LOW (ref >60)
GLUCOSE: 65 mg/dL — AB (ref 70–99)
Glucose, Bld: 76 mg/dL (ref 70–99)
Potassium: 6 mmol/L — ABNORMAL HIGH (ref 3.5–5.1)
Potassium: 5.2 mmol/L — ABNORMAL HIGH (ref 3.5–5.1)
Sodium: 136 mmol/L (ref 135–145)
Sodium: 136 mmol/L (ref 135–145)

## 2018-11-13 LAB — HEPARIN LEVEL (UNFRACTIONATED): Heparin Unfractionated: 0.49 IU/mL (ref 0.30–0.70)

## 2018-11-13 MED ORDER — HYDRALAZINE HCL 20 MG/ML IJ SOLN
10.0000 mg | Freq: Once | INTRAMUSCULAR | Status: AC | PRN
  Administered 2018-11-13: 10 mg via INTRAVENOUS
  Filled 2018-11-13: qty 1

## 2018-11-13 MED ORDER — SODIUM POLYSTYRENE SULFONATE 15 GM/60ML PO SUSP
30.0000 g | Freq: Once | ORAL | Status: AC
  Administered 2018-11-13: 30 g via ORAL
  Filled 2018-11-13: qty 120

## 2018-11-13 NOTE — Progress Notes (Signed)
ANTICOAGULATION CONSULT NOTE   Pharmacy Consult for Heparin Indication: chest pain/ACS  No Known Allergies  Patient Measurements: Height: _0  (177.8 cm) Weight: 126 lb 15.8 oz (57.6 kg) IBW/kg (Calculated) : 73 Heparin Dosing Weight: 55 kg   Vital Signs: Temp: 98.6 F (37 C) (12/14 0400) Temp Source: Oral (12/14 0400) BP: 162/81 (12/14 1000) Pulse Rate: 82 (12/14 1000)  Labs: Recent Labs    11/11/18 0617 11/11/18 0757 11/11/18 1053 11/11/18 1338  11/12/18 0008 11/12/18 1032 11/12/18 2002 11/13/18 0319 11/13/18 0754  HGB 14.2 13.6  --   --   --  12.5*  --   --  12.8*  --   HCT 44.4 40.0  --   --   --  39.7  --   --  39.5  --   PLT 164  --   --   --   --  186  --   --  145*  --   APTT 33  --   --   --   --   --   --   --   --   --   LABPROT 13.5  --   --   --   --   --   --   --   --   --   INR 1.04  --   --   --   --   --   --   --   --   --   HEPARINUNFRC  --   --   --   --    < > 0.17* 0.35 0.56 0.49  --   CREATININE 5.82* 5.20*  --   --   --  7.55*  --   --   --  4.94*  TROPONINI 0.04*  --  2.71* 8.21*  --  10.59*  --   --   --   --    < > = values in this interval not displayed.    Estimated Creatinine Clearance: 10.9 mL/min (A) (by C-G formula based on SCr of 4.94 mg/dL (H)).   Medical History: Past Medical History:  Diagnosis Date  . AAA (abdominal aortic aneurysm) ([place])   . Anemia   . Anxiety   . Arthritis    arms and back  . Blindness   . CHF (congestive heart failure) ([place])   . Coronary artery disease    mild   . 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 ESRD on hemodialysis ([place])    started HD 2014-15  . Headache(784.0)   . HTN (hypertension)   . Protein-calorie malnutrition ([place])   . STEMI (ST elevation myocardial infarction) ([place]) 11/11/2018  . Stroke [facility]) 01/2018   no residual deficits    Medications:  Medications Prior to Admission  Medication Sig Dispense Refill Last Dose  .  amLODipine (NORVASC) 10 MG tablet Take 10 mg by mouth at bedtime.   0 11/10/2018  . aspirin 81 MG chewable tablet Chew 1 tablet (81 mg total) by mouth daily.   11/11/2018 at 0900  . atorvastatin (LIPITOR) 40 MG tablet Take 40 mg by mouth at bedtime.   11/10/2018  . cinacalcet (SENSIPAR) 90 MG tablet Take 90 mg by mouth daily with supper.   11/10/2018  . clopidogrel (PLAVIX) 75 MG tablet Take 1 tablet (75 mg total) by mouth daily. 30 tablet 2 11/11/2018 at 0900  . docusate sodium (COLACE) 100 MG  capsule Take 200 mg by mouth at bedtime.    11/10/2018  . ferric citrate (AURYXIA) 1 GM 210 MG(Fe) tablet Take 630 mg by mouth 3 (three) times daily with meals.    11/11/2018 at Unknown time  . gabapentin (NEURONTIN) 300 MG capsule Take 1 capsule (300 mg total) by mouth at bedtime. 30 capsule 0 11/10/2018  . Glycopyrrolate (LONHALA MAGNAIR REFILL KIT) 25 MCG/ML SOLN Inhale 25 mcg into the lungs 2 (two) times daily.   11/11/2018 at Unknown time  . hydrALAZINE (APRESOLINE) 50 MG tablet Take 50 mg by mouth 3 (three) times daily.   11/11/2018 at Unknown time  . HYDROcodone-acetaminophen (NORCO) 5-325 MG tablet Take 1 tablet by mouth every 6 (six) hours as needed for moderate pain. 12 tablet 0 11/09/2018  . Icosapent Ethyl (VASCEPA) 1 g CAPS Take 1 g by mouth 2 (two) times daily.    11/11/2018 at Unknown time  . isosorbide mononitrate (IMDUR) 30 MG 24 hr tablet Take 30 mg by mouth daily.   11/11/2018 at Unknown time  . metoprolol tartrate (LOPRESSOR) 25 MG tablet Take 1 tablet (25 mg total) by mouth 2 (two) times daily. 60 tablet 5 11/11/2018 at Unknown time  . mirtazapine (REMERON) 7.5 MG tablet Take 7.5 mg by mouth at bedtime.   11/10/2018  . Nutritional Supplements (ENSURE PO) Take 240 mLs by mouth daily.   11/11/2018 at Unknown time  . senna (SENOKOT) 8.6 MG TABS tablet Take 1 tablet by mouth daily.   11/11/2018 at Unknown time  . bisacodyl (DULCOLAX) 10 MG suppository Place 10 mg rectally daily as needed  (constipation).    UNK  . doxercalciferol (HECTOROL) 4 MCG/2ML injection Inject 1 mL (2 mcg total) into the vein every Monday, Wednesday, and Friday with hemodialysis. (Patient not taking: Reported on 09/20/2018) 2 mL  Not Taking at Unknown time  . ipratropium-albuterol (DUONEB) 0.5-2.5 (3) MG/3ML SOLN Take 3 mLs by nebulization every 8 (eight) hours as needed (for copd).   UNK  . Nutritional Supplements (FEEDING SUPPLEMENT, NEPRO CARB STEADY,) LIQD Take 237 mLs by mouth 2 (two) times daily between meals. (Patient not taking: Reported on 11/12/2018)  0 Not Taking at Unknown time  . ondansetron (ZOFRAN) 4 MG tablet Take 4 mg by mouth every 6 (six) hours as needed for nausea or vomiting.   UNK    Assessment: 23 YOM w/ h/o CAD s/p CATH in 2017 here with chest pain. Unable to perform cardiac catheterization. Pharmacy consulted to dose IV heparin drop 850 uts/hr HL 0.49 at goal. CBC ok no bleeding.    Goal of Therapy:  Heparin level 0.3-0.7 units/ml Monitor platelets by anticoagulation protocol: Yes   Plan:   Continue heparin at 850 units/hr  Monitor daily HL, CBC and s/s of bleeding   Bonnita Nasuti Pharm.D. CPP, BCPS Clinical Pharmacist [phone] 11/13/2018 11:22 AM

## 2018-11-13 NOTE — Progress Notes (Signed)
Progress Note  Patient Name: Trevor [name] Date of Encounter: 11/13/2018  Primary Cardiologist: Kirk Ruths, MD   Subjective   No chest pain, no significant shortness of breath  Inpatient Medications    Scheduled Meds: . atorvastatin  80 mg Oral q1800  . Chlorhexidine Gluconate Cloth  6 each Topical Q0600  . clopidogrel  75 mg Oral Q breakfast  . doxercalciferol  1 mcg Intravenous Q M,W,F-HD  . feeding supplement (NEPRO CARB STEADY)  237 mL Oral BID BM  . isosorbide mononitrate  60 mg Oral Daily  . metoprolol tartrate  12.5 mg Oral BID  . sodium chloride flush  3 mL Intravenous Q12H  . sodium chloride flush  3 mL Intravenous Q12H   Continuous Infusions: . sodium chloride 10 mL/hr at 11/11/18 1400  . sodium chloride    . sodium chloride    . heparin 850 Units/hr (11/13/18 0700)   PRN Meds: sodium chloride, sodium chloride, acetaminophen, ALPRAZolam, hydrALAZINE, nitroGLYCERIN, ondansetron (ZOFRAN) IV, oxyCODONE, sodium chloride flush, sodium chloride flush, zolpidem   Vital Signs    Vitals:   11/13/18 0450 11/13/18 0500 11/13/18 0600 11/13/18 0700  BP:  (!) 155/94 (!) 168/70 (!) 166/66  Pulse: 80 82 60 60  Resp: (!) 26 (!) 23 (!) 25 (!) 47  Temp:      TempSrc:      SpO2: 100% 98% 97% 98%  Weight:      Height:        Intake/Output Summary (Last 24 hours) at 11/13/2018 0754 Last data filed at 11/13/2018 0700 Gross per 24 hour  Intake 203.99 ml  Output 1000 ml  Net -796.01 ml   Filed Weights   11/12/18 1523 11/12/18 1923 11/13/18 0447  Weight: 56.8 kg 55.8 kg 57.6 kg    Telemetry    Sinus rhythm- Personally Reviewed  ECG    Sinus rhythm with deep T wave inversions mostly lateral precordial leads but diffusely.  No significant change from prior- Personally Reviewed  Physical Exam   GEN: No acute distress, frail-appearing.   Neck: No JVD Cardiac: RRR, no murmurs, rubs, positive S4 gallops.  Respiratory: Clear to auscultation bilaterally. GI:  Soft, nontender, non-distended  MS: No edema; No deformity. Neuro:  Nonfocal  Psych: Normal affect   Labs    Chemistry Recent Labs  Lab 11/11/18 0617 11/11/18 0757 11/12/18 0008  NA 137 138 138  K 6.2* 4.7 5.8*  CL 91* 97* 93*  CO2 26  --  29  GLUCOSE 65* 78 78  BUN 20 20 40*  CREATININE 5.82* 5.20* 7.55*  CALCIUM 9.6  --  9.0  PROT 7.9  --   --   ALBUMIN 3.6  --   --   AST 26  --   --   ALT 12  --   --   ALKPHOS 93  --   --   BILITOT 1.2  --   --   GFRNONAA 9*  --  6*  GFRAA 10*  --  7*  ANIONGAP 20*  --  16*     Hematology Recent Labs  Lab 11/11/18 0617 11/11/18 0757 11/12/18 0008 11/13/18 0319  WBC 6.6  --  5.7 5.2  RBC 4.96  --  4.56 4.61  HGB 14.2 13.6 12.5* 12.8*  HCT 44.4 40.0 39.7 39.5  MCV 89.5  --  87.1 85.7  MCH 28.6  --  27.4 27.8  MCHC 32.0  --  31.5 32.4  RDW 14.4  --  14.3 14.0  PLT 164  --  186 145*    Cardiac Enzymes Recent Labs  Lab 11/11/18 0617 11/11/18 1053 11/11/18 1338 11/12/18 0008  TROPONINI 0.04* 2.71* 8.21* 10.59*   No results for input(s): TROPIPOC in the last 168 hours.   BNPNo results for input(s): BNP, PROBNP in the last 168 hours.   DDimer No results for input(s): DDIMER in the last 168 hours.   Radiology    No results found.  Cardiac Studies   Unable to perform cardiac cath due to lack of arterial access.  Echocardiogram 11/12/2018: - Left ventricle: The cavity size was normal. Wall thickness was   increased in a pattern of moderate LVH. There was moderate   concentric hypertrophy. Systolic function was mildly reduced. The   estimated ejection fraction was in the range of 45% to 50%.   Diffuse hypokinesis. Doppler parameters are consistent with   abnormal left ventricular relaxation (grade 1 diastolic   dysfunction). - Mitral valve: Calcified annulus. There was no significant   regurgitation. - Left atrium: The atrium was mildly dilated. - Pericardium, extracardiac: A trivial pericardial effusion was    identified posterior to the heart.  Impressions:  - Mildly reduced LV EF, confirmed with 2D tracking. Diffuse   hypokinesis, no regional wall motion abnormalities. Trivial   posterior pericardial effusion. Overall similar to prior.  Patient Profile     72 y.o. male with non-ST elevation myocardial infarction, currently medically managed secondary to lack of access.  Comorbidities include prior catheterization December 2017 showing nonobstructive coronary artery disease, end-stage renal disease on hemodialysis Monday Wednesday Friday, prior stroke, blind right greater than left eyes, anemia, hypertension, AAA 3.2 cm.  Assessment & Plan    Non-ST elevation myocardial infarction - No nonobstructive coronary artery disease in 2017.  Unfortunately, unable to access an arteriotomy site due to severe underlying peripheral vascular disease.  Dr. Tamala Julian discussed with patient, family.  Continuing with medical management.  This is also limited secondary to hypotension.  Peak troponin 10.5.  LDL 55.  Poor prognosis.  Per Dr. Tamala Julian, long-acting nitrate was added around dialysis.   - EF only 45%, mildly reduced. - We will stop IV heparin today at approximately 4:00 PM which will be a total of 48 hours of treatment. -Continue with low-dose beta-blocker.  Unable to utilize ACE inhibitor or angiotensin receptor blocker. -Complete continue with clopidogrel 75.  This will supplant aspirin.  He is at increased risk for bleeding.  Acute on chronic combined systolic and diastolic heart failure - Dialysis for intravascular volume.  Peripheral arterial disease with axillobifemoral bypass - Continue with secondary prevention.  Hyperlipidemia -LDL less than 70.  Currently on high intensity statin 80 mg.  Sharp left foot pain - I believe I do feel a palpable dorsalis pedis pulse.  I will have nurse check Dopplers.  Foot is warm.  Appears to be perfusing well.  Would be okay transferring to telemetry  today.  For questions or updates, please contact [place] Please consult [email or web address] for contact info under        Signed, Candee Furbish, MD  11/13/2018, 7:54 AM

## 2018-11-14 ENCOUNTER — Inpatient Hospital Stay (HOSPITAL_COMMUNITY): Payer: Medicare Other

## 2018-11-14 ENCOUNTER — Other Ambulatory Visit: Payer: Self-pay

## 2018-11-14 LAB — CBC
HCT: 37.1 % — ABNORMAL LOW (ref 39.0–52.0)
Hemoglobin: 11.9 g/dL — ABNORMAL LOW (ref 13.0–17.0)
MCH: 27.5 pg (ref 26.0–34.0)
MCHC: 32.1 g/dL (ref 30.0–36.0)
MCV: 85.9 fL (ref 80.0–100.0)
Platelets: 164 10*3/uL (ref 150–400)
RBC: 4.32 MIL/uL (ref 4.22–5.81)
RDW: 13.9 % (ref 11.5–15.5)
WBC: 4.8 10*3/uL (ref 4.0–10.5)
nRBC: 0 % (ref 0.0–0.2)

## 2018-11-14 LAB — HEPARIN LEVEL (UNFRACTIONATED): Heparin Unfractionated: <0.1 [IU]/mL — ABNORMAL LOW (ref 0.30–0.70)

## 2018-11-14 MED ORDER — SODIUM CHLORIDE 0.9 % IV BOLUS: 500.0000 mL | Freq: Once | INTRAVENOUS | Status: DC

## 2018-11-14 MED ORDER — CHLORHEXIDINE GLUCONATE CLOTH 2 % EX PADS: 6.0000 | MEDICATED_PAD | Freq: Every day | CUTANEOUS | Status: DC

## 2018-11-14 NOTE — Progress Notes (Signed)
Progress Note  Patient Name: Trevor [name] Date of Encounter: 11/14/2018  Primary Cardiologist: Kirk Ruths, MD    Subjective   72 year old male with a history of coronary artery disease.  He was admitted with a non-ST segment elevation myocardial infarction.  Cath was attempted but the unable to access any arterial site for the cath.  Plan is been for continued medical therapy.  Inpatient Medications    Scheduled Meds: . atorvastatin  80 mg Oral q1800  . clopidogrel  75 mg Oral Q breakfast  . doxercalciferol  1 mcg Intravenous Q M,W,F-HD  . feeding supplement (NEPRO CARB STEADY)  237 mL Oral BID BM  . isosorbide mononitrate  60 mg Oral Daily  . metoprolol tartrate  12.5 mg Oral BID  . sodium chloride flush  3 mL Intravenous Q12H  . sodium chloride flush  3 mL Intravenous Q12H   Continuous Infusions: . sodium chloride 10 mL/hr at 11/11/18 1400  . sodium chloride    . sodium chloride     PRN Meds: sodium chloride, sodium chloride, acetaminophen, ALPRAZolam, nitroGLYCERIN, ondansetron (ZOFRAN) IV, oxyCODONE, sodium chloride flush, sodium chloride flush, zolpidem   Vital Signs    Vitals:   11/13/18 2148 11/13/18 2151 11/14/18 0532 11/14/18 0806  BP:  124/72 (!) 160/92 125/85  Pulse: 85  62 68  Resp:  18 15   Temp:   98.8 F (37.1 C)   TempSrc:   Oral   SpO2:   96%   Weight:   52.2 kg   Height:        Intake/Output Summary (Last 24 hours) at 11/14/2018 0953 Last data filed at 11/14/2018 0810 Gross per 24 hour  Intake 304 ml  Output 100 ml  Net 204 ml   Filed Weights   11/12/18 1923 11/13/18 0447 11/14/18 0532  Weight: 55.8 kg 57.6 kg 52.2 kg     Physical Exam    GEN:  Chronically ill-appearing gentleman, no acute distress. Neck: No JVD Cardiac:  Rate. Respiratory: Clear to auscultation bilaterally. GI: Soft, nontender, non-distended  MS: No edema; No deformity.  Poor Distal pulses. Neuro:  Nonfocal  Psych: Normal affect   Labs      Chemistry Recent Labs  Lab 11/11/18 0617  11/12/18 0008 11/13/18 0754 11/13/18 1625  NA 137   < > 138 136 136  K 6.2*   < > 5.8* 6.0* 5.2*  CL 91*   < > 93* 95* 95*  CO2 26  --  29 30 27   GLUCOSE 65*   < > 78 65* 76  BUN 20   < > 40* 17 24*  CREATININE 5.82*   < > 7.55* 4.94* 5.83*  CALCIUM 9.6  --  9.0 9.1 9.4  PROT 7.9  --   --   --   --   ALBUMIN 3.6  --   --   --   --   AST 26  --   --   --   --   ALT 12  --   --   --   --   ALKPHOS 93  --   --   --   --   BILITOT 1.2  --   --   --   --   GFRNONAA 9*  --  6* 11* 9*  GFRAA 10*  --  7* 12* 10*  ANIONGAP 20*  --  16* 11 14   < > = values in this interval not displayed.  Hematology Recent Labs  Lab 11/12/18 0008 11/13/18 0319 11/14/18 0322  WBC 5.7 5.2 4.8  RBC 4.56 4.61 4.32  HGB 12.5* 12.8* 11.9*  HCT 39.7 39.5 37.1*  MCV 87.1 85.7 85.9  MCH 27.4 27.8 27.5  MCHC 31.5 32.4 32.1  RDW 14.3 14.0 13.9  PLT 186 145* 164    Cardiac Enzymes Recent Labs  Lab 11/11/18 0617 11/11/18 1053 11/11/18 1338 11/12/18 0008  TROPONINI 0.04* 2.71* 8.21* 10.59*   No results for input(s): TROPIPOC in the last 168 hours.   BNPNo results for input(s): BNP, PROBNP in the last 168 hours.   DDimer No results for input(s): DDIMER in the last 168 hours.   Radiology    No results found.  Cardiac Studies     Telemetry     NSR , deep T wave inversion - Personally Reviewed  ECG     - Personally Reviewed  Patient Profile     72 y.o. male admitted with a non-ST segment elevation myocardial infarction.  Cardiac catheterization was attempted but they were unable to achieve arterial access.  Assessment & Plan    1.  Coronary artery disease: The patient was admitted with a non-ST segment elevation myocardial infarction.  Cardiac cath was attempted but they were unable to achieve vascular access.    Continue with medical therapy.     2.  Acute on chronic combined systolic and diastolic congestive heart  failure. Hemodialysis for volume management. BP Is up today .   Will add hydralazine and titrate up slowly. He has had hypotension prior to day    For questions or updates, please contact [place] Please consult [email or web address] for contact info under        Signed, Mertie Moores, MD  11/14/2018, 9:53 AM

## 2018-11-14 NOTE — Progress Notes (Addendum)
[facility] Progress Note  Subjective: alert, no CP.  C/O L heel pain again  Vitals:   11/14/18 0532 11/14/18 0806 11/14/18 0900 11/14/18 1353  BP: (!) 160/92 125/85  95/77  Pulse: 62 68  73  Resp: 15  13 13   Temp: 98.8 F (37.1 C)   98.5 F (36.9 C)  TempSrc: Oral   Oral  SpO2: 96%   99%  Weight: 52.2 kg     Height:        Inpatient medications: . atorvastatin  80 mg Oral q1800  . clopidogrel  75 mg Oral Q breakfast  . doxercalciferol  1 mcg Intravenous Q M,W,F-HD  . feeding supplement (NEPRO CARB STEADY)  237 mL Oral BID BM  . isosorbide mononitrate  60 mg Oral Daily  . metoprolol tartrate  12.5 mg Oral BID  . sodium chloride flush  3 mL Intravenous Q12H  . sodium chloride flush  3 mL Intravenous Q12H   . sodium chloride 7.2 mL/hr at 11/14/18 0430  . sodium chloride    . sodium chloride     sodium chloride, sodium chloride, acetaminophen, ALPRAZolam, nitroGLYCERIN, ondansetron (ZOFRAN) IV, oxyCODONE, sodium chloride flush, sodium chloride flush, zolpidem  Iron/TIBC/Ferritin/ %Sat    Component Value Date/Time   IRON 46 03/30/2018 0343   TIBC 193 (L) 03/30/2018 0343   FERRITIN 1,290 (H) 03/30/2018 0343   IRONPCTSAT 24 03/30/2018 0343    Exam: General: Frail man, NAD Neck: Supple without lymphadenopathy/masses. JVD not elevated. Lungs: Clear bilaterally to auscultation without wheezes, rales, or rhonchi Heart: RRR with normal S1, S2. No murmurs, rubs, or gallops appreciated. Abdomen: Soft, non-tender, non-distended with normoactive bowel sounds.  Lower extremities: No edema or ischemic changes, no open wounds. . L heel examined, no gangrene , ulceration or bleeding. Feet are warm.  Neuro: Alert and oriented X 3. Moves all extremities spontaneously. Dialysis Access: L AVF + thrill  Dialysis Orders:  [facility]WF [facility] 4h  400/800  56kg  2/2.25  P4  AVF  Hep none - Hectoral 34mcg IV q HD  Assessment/Plan:  ACS/diffuse ST depression: failed LHC attempt,  medical Rx. Max trop 10  L heel pain - nothing on exam, will get plain films   ESRD: HD MWF.  HD Monday. K+ on high side  Hypertension/volume: BP soft today. Under dry wt, will need to let volume come up as requiring anti-anginals for #1. No UF HD tomorrow.   Anemia: Hgb 13.6. No ESA needed.  Metabolic bone disease: Ca ok, Phos pending. Resume Hectoral  Hx AAA  Hx CVA  Blindness   Kelly Splinter MD [facility] pager [phone]   11/13/2018, 4:46 PM   Recent Labs  Lab 11/11/18 0617  11/13/18 0754 11/13/18 1625  NA 137   < > 136 136  K 6.2*   < > 6.0* 5.2*  CL 91*   < > 95* 95*  CO2 26   < > 30 27  GLUCOSE 65*   < > 65* 76  BUN 20   < > 17 24*  CREATININE 5.82*   < > 4.94* 5.83*  CALCIUM 9.6   < > 9.1 9.4  ALBUMIN 3.6  --   --   --   INR 1.04  --   --   --    < > = values in this interval not displayed.   Recent Labs  Lab 11/11/18 0617  AST 26  ALT 12  ALKPHOS 93  BILITOT 1.2  PROT  7.9   Recent Labs  Lab 11/11/18 0617  11/13/18 0319 11/14/18 0322  WBC 6.6   < > 5.2 4.8  NEUTROABS 3.6  --   --   --   HGB 14.2   < > 12.8* 11.9*  HCT 44.4   < > 39.5 37.1*  MCV 89.5   < > 85.7 85.9  PLT 164   < > 145* 164   < > = values in this interval not displayed.

## 2018-11-14 NOTE — Progress Notes (Signed)
A/O x4.  Denies dizziness.  BP 105/48 HR 70.  Bolos held.  Idolina Primer, RN

## 2018-11-14 NOTE — Progress Notes (Addendum)
[facility] Progress Note  Subjective: alert, no CP.  C/O L heel pain, unable to get comfortable  Vitals:   11/14/18 0532 11/14/18 0806 11/14/18 0900 11/14/18 1353  BP: (!) 160/92 125/85  95/77  Pulse: 62 68  73  Resp: 15  13 13   Temp: 98.8 F (37.1 C)   98.5 F (36.9 C)  TempSrc: Oral   Oral  SpO2: 96%   99%  Weight: 52.2 kg     Height:        Inpatient medications: . atorvastatin  80 mg Oral q1800  . clopidogrel  75 mg Oral Q breakfast  . doxercalciferol  1 mcg Intravenous Q M,W,F-HD  . feeding supplement (NEPRO CARB STEADY)  237 mL Oral BID BM  . isosorbide mononitrate  60 mg Oral Daily  . metoprolol tartrate  12.5 mg Oral BID  . sodium chloride flush  3 mL Intravenous Q12H  . sodium chloride flush  3 mL Intravenous Q12H   . sodium chloride 7.2 mL/hr at 11/14/18 0430  . sodium chloride    . sodium chloride     sodium chloride, sodium chloride, acetaminophen, ALPRAZolam, nitroGLYCERIN, ondansetron (ZOFRAN) IV, oxyCODONE, sodium chloride flush, sodium chloride flush, zolpidem  Iron/TIBC/Ferritin/ %Sat    Component Value Date/Time   IRON 46 03/30/2018 0343   TIBC 193 (L) 03/30/2018 0343   FERRITIN 1,290 (H) 03/30/2018 0343   IRONPCTSAT 24 03/30/2018 0343    Exam: General: Frail man, NAD Neck: Supple without lymphadenopathy/masses. JVD not elevated. Lungs: Clear bilaterally to auscultation without wheezes, rales, or rhonchi Heart: RRR with normal S1, S2. No murmurs, rubs, or gallops appreciated. Abdomen: Soft, non-tender, non-distended with normoactive bowel sounds.  Lower extremities: No edema or ischemic changes, no open wounds. . L heel examined, no gangrene , ulceration or bleeding. Feet are warm.  Neuro: Alert and oriented X 3. Moves all extremities spontaneously. Dialysis Access: L AVF + thrill  Dialysis Orders:  MWF [facility] 4h  400/800  56kg  2/2.25  P4  AVF  Hep none - Hectoral 57mcg IV q HD  Assessment/Plan:  ACS/diffuse ST depression:  failed LHC attempt, medical Rx. Max trop 10  ESRD: HD MWF.  HD Monday.   Hypertension/volume: stable, under dry wt  Anemia: Hgb 11 No ESA needed.  Metabolic bone disease: Ca ok, Phos pending. Resume Hectoral  Hx AAA  Hx CVA  Blindness   Kelly Splinter MD [facility] pager [phone]   11/13/2018, 4:43 PM   Recent Labs  Lab 11/11/18 0617  11/13/18 0754 11/13/18 1625  NA 137   < > 136 136  K 6.2*   < > 6.0* 5.2*  CL 91*   < > 95* 95*  CO2 26   < > 30 27  GLUCOSE 65*   < > 65* 76  BUN 20   < > 17 24*  CREATININE 5.82*   < > 4.94* 5.83*  CALCIUM 9.6   < > 9.1 9.4  ALBUMIN 3.6  --   --   --   INR 1.04  --   --   --    < > = values in this interval not displayed.   Recent Labs  Lab 11/11/18 0617  AST 26  ALT 12  ALKPHOS 93  BILITOT 1.2  PROT 7.9   Recent Labs  Lab 11/11/18 0617  11/13/18 0319 11/14/18 0322  WBC 6.6   < > 5.2 4.8  NEUTROABS 3.6  --   --   --  HGB 14.2   < > 12.8* 11.9*  HCT 44.4   < > 39.5 37.1*  MCV 89.5   < > 85.7 85.9  PLT 164   < > 145* 164   < > = values in this interval not displayed.

## 2018-11-15 ENCOUNTER — Telehealth: Payer: Self-pay | Admitting: Cardiology

## 2018-11-15 DIAGNOSIS — I2510 Atherosclerotic heart disease of native coronary artery without angina pectoris: Secondary | ICD-10-CM | POA: Diagnosis not present

## 2018-11-15 DIAGNOSIS — D649 Anemia, unspecified: Secondary | ICD-10-CM | POA: Diagnosis not present

## 2018-11-15 DIAGNOSIS — E559 Vitamin D deficiency, unspecified: Secondary | ICD-10-CM | POA: Diagnosis not present

## 2018-11-15 DIAGNOSIS — R41841 Cognitive communication deficit: Secondary | ICD-10-CM | POA: Diagnosis present

## 2018-11-15 DIAGNOSIS — E785 Hyperlipidemia, unspecified: Secondary | ICD-10-CM | POA: Diagnosis not present

## 2018-11-15 DIAGNOSIS — M6281 Muscle weakness (generalized): Secondary | ICD-10-CM | POA: Diagnosis present

## 2018-11-15 DIAGNOSIS — F4321 Adjustment disorder with depressed mood: Secondary | ICD-10-CM | POA: Diagnosis not present

## 2018-11-15 DIAGNOSIS — N2581 Secondary hyperparathyroidism of renal origin: Secondary | ICD-10-CM | POA: Diagnosis not present

## 2018-11-15 DIAGNOSIS — I639 Cerebral infarction, unspecified: Secondary | ICD-10-CM | POA: Diagnosis not present

## 2018-11-15 DIAGNOSIS — R0789 Other chest pain: Secondary | ICD-10-CM | POA: Diagnosis not present

## 2018-11-15 DIAGNOSIS — M2550 Pain in unspecified joint: Secondary | ICD-10-CM | POA: Diagnosis not present

## 2018-11-15 DIAGNOSIS — G629 Polyneuropathy, unspecified: Secondary | ICD-10-CM | POA: Diagnosis present

## 2018-11-15 DIAGNOSIS — I10 Essential (primary) hypertension: Secondary | ICD-10-CM | POA: Diagnosis present

## 2018-11-15 DIAGNOSIS — Z7401 Bed confinement status: Secondary | ICD-10-CM | POA: Diagnosis not present

## 2018-11-15 DIAGNOSIS — R2689 Other abnormalities of gait and mobility: Secondary | ICD-10-CM | POA: Diagnosis present

## 2018-11-15 DIAGNOSIS — Z992 Dependence on renal dialysis: Secondary | ICD-10-CM | POA: Diagnosis not present

## 2018-11-15 DIAGNOSIS — D631 Anemia in chronic kidney disease: Secondary | ICD-10-CM | POA: Diagnosis present

## 2018-11-15 DIAGNOSIS — E039 Hypothyroidism, unspecified: Secondary | ICD-10-CM | POA: Diagnosis not present

## 2018-11-15 DIAGNOSIS — I214 Non-ST elevation (NSTEMI) myocardial infarction: Secondary | ICD-10-CM | POA: Diagnosis not present

## 2018-11-15 DIAGNOSIS — I5042 Chronic combined systolic (congestive) and diastolic (congestive) heart failure: Secondary | ICD-10-CM | POA: Diagnosis present

## 2018-11-15 DIAGNOSIS — E119 Type 2 diabetes mellitus without complications: Secondary | ICD-10-CM | POA: Diagnosis not present

## 2018-11-15 DIAGNOSIS — N186 End stage renal disease: Secondary | ICD-10-CM | POA: Diagnosis present

## 2018-11-15 DIAGNOSIS — R079 Chest pain, unspecified: Secondary | ICD-10-CM | POA: Diagnosis not present

## 2018-11-15 DIAGNOSIS — I120 Hypertensive chronic kidney disease with stage 5 chronic kidney disease or end stage renal disease: Secondary | ICD-10-CM | POA: Diagnosis not present

## 2018-11-15 DIAGNOSIS — G459 Transient cerebral ischemic attack, unspecified: Secondary | ICD-10-CM | POA: Diagnosis not present

## 2018-11-15 DIAGNOSIS — Z8673 Personal history of transient ischemic attack (TIA), and cerebral infarction without residual deficits: Secondary | ICD-10-CM | POA: Diagnosis not present

## 2018-11-15 LAB — CBC
HCT: 34.9 % — ABNORMAL LOW (ref 39.0–52.0)
Hemoglobin: 11.6 g/dL — ABNORMAL LOW (ref 13.0–17.0)
MCH: 28.4 pg (ref 26.0–34.0)
MCHC: 33.2 g/dL (ref 30.0–36.0)
MCV: 85.3 fL (ref 80.0–100.0)
Platelets: 124 10*3/uL — ABNORMAL LOW (ref 150–400)
RBC: 4.09 MIL/uL — ABNORMAL LOW (ref 4.22–5.81)
RDW: 13.8 % (ref 11.5–15.5)
WBC: 3.3 10*3/uL — ABNORMAL LOW (ref 4.0–10.5)
nRBC: 0 % (ref 0.0–0.2)

## 2018-11-15 LAB — RENAL FUNCTION PANEL
Albumin: 2.9 g/dL — ABNORMAL LOW (ref 3.5–5.0)
Anion gap: 16 — ABNORMAL HIGH (ref 5–15)
BUN: 47 mg/dL — ABNORMAL HIGH (ref 8–23)
CO2: 26 mmol/L (ref 22–32)
Calcium: 9.4 mg/dL (ref 8.9–10.3)
Chloride: 95 mmol/L — ABNORMAL LOW (ref 98–111)
Creatinine, Ser: 9.85 mg/dL — ABNORMAL HIGH (ref 0.61–1.24)
GFR calc Af Amer: 5 mL/min — ABNORMAL LOW (ref >60)
GFR calc non Af Amer: 5 mL/min — ABNORMAL LOW (ref >60)
Glucose, Bld: 72 mg/dL (ref 70–99)
POTASSIUM: 5.3 mmol/L — AB (ref 3.5–5.1)
Phosphorus: 7.1 mg/dL — ABNORMAL HIGH (ref 2.5–4.6)
Sodium: 137 mmol/L (ref 135–145)

## 2018-11-15 MED ORDER — METOPROLOL TARTRATE 25 MG PO TABS: 12.5000 mg | ORAL_TABLET | Freq: Two times a day (BID) | ORAL | 5 refills | Status: DC

## 2018-11-15 MED ORDER — NITROGLYCERIN 0.4 MG SL SUBL: 0.4000 mg | SUBLINGUAL_TABLET | SUBLINGUAL | 12 refills | Status: AC | PRN

## 2018-11-15 MED ORDER — SODIUM CHLORIDE 0.9 % IV SOLN: 100.0000 mL | INTRAVENOUS | Status: DC | PRN

## 2018-11-15 MED ORDER — HYDRALAZINE HCL 20 MG/ML IJ SOLN
10.0000 mg | INTRAMUSCULAR | Status: DC | PRN
  Administered 2018-11-15: 10 mg via INTRAVENOUS
  Filled 2018-11-15: qty 1

## 2018-11-15 MED ORDER — LIDOCAINE-PRILOCAINE 2.5-2.5 % EX CREA: 1.0000 "application " | TOPICAL_CREAM | CUTANEOUS | Status: DC | PRN

## 2018-11-15 MED ORDER — ASPIRIN EC 81 MG PO TBEC: 81.0000 mg | DELAYED_RELEASE_TABLET | Freq: Every day | ORAL | 3 refills | Status: DC

## 2018-11-15 MED ORDER — ISOSORBIDE MONONITRATE ER 60 MG PO TB24: 60.0000 mg | ORAL_TABLET | Freq: Every day | ORAL | 6 refills | Status: DC

## 2018-11-15 MED ORDER — ATORVASTATIN CALCIUM 80 MG PO TABS: 80.0000 mg | ORAL_TABLET | Freq: Every day | ORAL | 6 refills | Status: DC

## 2018-11-15 MED ORDER — ALTEPLASE 2 MG IJ SOLR: 2.0000 mg | Freq: Once | INTRAMUSCULAR | Status: DC | PRN

## 2018-11-15 MED ORDER — HEPARIN SODIUM (PORCINE) 1000 UNIT/ML DIALYSIS: 1000.0000 [IU] | INTRAMUSCULAR | Status: DC | PRN

## 2018-11-15 MED ORDER — DOXERCALCIFEROL 4 MCG/2ML IV SOLN
INTRAVENOUS | Status: AC
  Administered 2018-11-15: 1 ug via INTRAVENOUS
  Filled 2018-11-15: qty 2

## 2018-11-15 MED ORDER — FERRIC CITRATE 1 GM 210 MG(FE) PO TABS
630.0000 mg | ORAL_TABLET | Freq: Three times a day (TID) | ORAL | Status: DC
  Filled 2018-11-15 (×2): qty 3

## 2018-11-15 MED ORDER — CINACALCET HCL 30 MG PO TABS
90.0000 mg | ORAL_TABLET | Freq: Every day | ORAL | Status: DC
  Filled 2018-11-15: qty 3

## 2018-11-15 MED ORDER — LIDOCAINE HCL (PF) 1 % IJ SOLN: 5.0000 mL | INTRAMUSCULAR | Status: DC | PRN

## 2018-11-15 MED ORDER — PENTAFLUOROPROP-TETRAFLUOROETH EX AERO: 1.0000 "application " | INHALATION_SPRAY | CUTANEOUS | Status: DC | PRN

## 2018-11-15 MED ORDER — FERRIC CITRATE 1 GM 210 MG(FE) PO TABS
630.0000 mg | ORAL_TABLET | Freq: Three times a day (TID) | ORAL | Status: DC
  Filled 2018-11-15: qty 3

## 2018-11-15 NOTE — [place] FL2 (Signed)
[place] MEDICAID FL2 LEVEL OF CARE SCREENING TOOL     IDENTIFICATION  Patient Name: Trevor [name] Birthdate: Mar 16, 1946 Sex: male Admission Date (Current Location): 11/11/2018  [facility] and [place] Number:  Herbalist and Address:  [facility], [place] [address] [place], [place] [address]      Provider Number: [id number]  Attending Physician Name and Address:  Sherren Mocha, MD  Relative Name and Phone Number:  Asahd Can, daughter, [phone]    Current Level of Care: Hospital Recommended Level of Care: [place] Prior Approval Number:    Date Approved/Denied:   PASRR Number: [id number]  Discharge Plan: [name]    Current Diagnoses: Patient Active Problem List   Diagnosis Date Noted  . STEMI (ST elevation myocardial infarction) ([place]) 11/11/2018  . NSTEMI (non-ST elevated myocardial infarction) ([place]) 11/11/2018  . Pulmonary edema 09/20/2018  . Acute respiratory failure with hypoxia ([place]) 03/29/2018  . ESRD on dialysis ([place]) 03/29/2018  . Sleep stage dysfunction 11/10/2017  . 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 Vascular device, implant, or graft complication 75/91/6384  . Peripheral arterial disease ([place]) 10/20/2017  . Non-allergic rhinitis 10/20/2017  . 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 BPH (benign prostatic hyperplasia) 07/07/2017  . At risk for adverse drug reaction 07/02/2017  . Ischemia of extremity 06/28/2017  . Acute pulmonary edema ([place]) 06/27/2017  . Hypertensive urgency 01/24/2017  . 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 Ischemic chest pain ([place]) 11/01/2016  . Essential hypertension 03/08/2016  . Dyslipidemia 01/12/2016  . Chest pain 08/12/2015  . Accelerated hypertension 08/12/2015  . Carotid stenosis   . Abdominal aortic aneurysm ([place]) 03/06/2015  . Protein-calorie malnutrition, severe ([place]) 05/20/2014  . End-stage renal disease on hemodialysis ([place]) 05/19/2014  . Anemia of renal  disease 05/19/2014  . Coronary artery disease, non-occlusive:  09/28/2012  . Secondary hyperparathyroidism ([place]) 09/27/2012  . Non compliance with medical treatment 09/27/2012  . Acute on chronic combined systolic and diastolic CHF (congestive heart failure) ([place]) 10/10/2011  . A-fib ([place]) 10/08/2011  . History of CVA (cerebrovascular accident) 10/07/2011  . Cocaine abuse ([place]) 10/07/2011  . Tobacco use disorder 08/07/2011  . Bruit 08/07/2011    Orientation RESPIRATION BLADDER Height & Weight     Self, Time, Situation, Place  Normal Continent Weight: 56.3 kg Height:  5\' 10"  (177.8 cm)  BEHAVIORAL SYMPTOMS/MOOD NEUROLOGICAL BOWEL NUTRITION STATUS      Continent Diet(please see DC summary)  AMBULATORY STATUS COMMUNICATION OF NEEDS Skin   (baseline) Verbally Normal                       Personal Care Assistance Level of Assistance  Bathing, Feeding, Dressing(baseline)           Functional Limitations Info  Sight, Hearing, Speech Sight Info: Adequate Hearing Info: Adequate Speech Info: Adequate    SPECIAL CARE FACTORS FREQUENCY                       Contractures Contractures Info: Not present    Additional Factors Info  Code Status, Allergies Code Status Info: Full Allergies Info: No Known Allergies           Current Medications (11/15/2018):  This is the current hospital active medication list Current Facility-Administered Medications  Medication Dose Route Frequency Provider Last Rate Last Dose  . 0.9 %  sodium chloride infusion   Intravenous Continuous Kathrynn Humble, Ankit, MD 7.2 mL/hr at 11/14/18 0430    .  0.9 %  sodium chloride infusion  250 mL Intravenous PRN Barrett, Rhonda G, PA-C      . 0.9 %  sodium chloride infusion  250 mL Intravenous PRN Sherren Mocha, MD      . acetaminophen (TYLENOL) tablet 650 mg  650 mg Oral Q4H PRN Barrett, Evelene Croon, PA-C   650 mg at 11/14/18 2018  . ALPRAZolam (XANAX) tablet 0.25 mg  0.25 mg Oral BID PRN Barrett,  Rhonda G, PA-C      . atorvastatin (LIPITOR) tablet 80 mg  80 mg Oral q1800 Barrett, Rhonda G, PA-C   80 mg at 11/14/18 1830  . Chlorhexidine Gluconate Cloth 2 % PADS 6 each  6 each Topical Q0600 [name], MD      . cinacalcet [facility]) tablet 90 mg  90 mg Oral Q supper Lynnda Child, PA-C      . clopidogrel (PLAVIX) tablet 75 mg  75 mg Oral Q breakfast Sherren Mocha, MD   75 mg at 11/15/18 1122  . doxercalciferol (HECTOROL) injection 1 mcg  1 mcg Intravenous Q M,W,F-HD Loren Racer, PA-C   1 mcg at 11/15/18 1058  . feeding supplement (NEPRO CARB STEADY) liquid 237 mL  237 mL Oral BID BM Sherren Mocha, MD   237 mL at 11/15/18 1123  . ferric citrate (AURYXIA) tablet 630 mg  630 mg Oral TID WC Ejigiri, Thomos Lemons, PA-C      . hydrALAZINE (APRESOLINE) injection 10 mg  10 mg Intravenous Q4H PRN Rudean Curt, MD   10 mg at 11/15/18 0215  . isosorbide mononitrate (IMDUR) 24 hr tablet 60 mg  60 mg Oral Daily Belva Crome, MD   60 mg at 11/15/18 1122  . metoprolol tartrate (LOPRESSOR) tablet 12.5 mg  12.5 mg Oral BID Belva Crome, MD   12.5 mg at 11/15/18 1122  . nitroGLYCERIN (NITROSTAT) SL tablet 0.4 mg  0.4 mg Sublingual Q5 Min x 3 PRN Barrett, Rhonda G, PA-C      . ondansetron (ZOFRAN) injection 4 mg  4 mg Intravenous Q6H PRN Barrett, Rhonda G, PA-C      . oxyCODONE (Oxy IR/ROXICODONE) immediate release tablet 5-10 mg  5-10 mg Oral Q4H PRN Sherren Mocha, MD   5 mg at 11/14/18 2018  . sodium chloride 0.9 % bolus 500 mL  500 mL Intravenous Once [name], MD      . sodium chloride flush (NS) 0.9 % injection 3 mL  3 mL Intravenous Q12H Barrett, Rhonda G, PA-C   3 mL at 11/15/18 1124  . sodium chloride flush (NS) 0.9 % injection 3 mL  3 mL Intravenous PRN Barrett, Rhonda G, PA-C      . sodium chloride flush (NS) 0.9 % injection 3 mL  3 mL Intravenous Q12H Sherren Mocha, MD   3 mL at 11/15/18 1124  . sodium chloride flush (NS) 0.9 % injection 3 mL  3 mL  Intravenous PRN Sherren Mocha, MD      . zolpidem [facility]) tablet 5 mg  5 mg Oral QHS PRN Barrett, Evelene Croon, PA-C   5 mg at 11/12/18 2105     Discharge Medications: Please see discharge summary for a list of discharge medications.  Relevant Imaging Results:  Relevant Lab Results:   Additional Information SSN: [id number] Dialysis patient MWF  Estanislado Emms, LCSW

## 2018-11-15 NOTE — Progress Notes (Signed)
Patient will discharge back to [facility]. Anticipated discharge date: 11/15/18 Family notified: Rithwik Schmieg, son Transportation by: PTAR  Nurse to call report to [phone].  CSW signing off.  Estanislado Emms, [place]  Clinical Social Worker

## 2018-11-15 NOTE — Progress Notes (Addendum)
Progress Note  Patient Name: Trevor [name] Date of Encounter: 11/15/2018  Primary Cardiologist: Kirk Ruths, MD   Subjective   Feeling well. No chest pain, sob or palpitations. Seen in HD.   Inpatient Medications    Scheduled Meds: . atorvastatin  80 mg Oral q1800  . Chlorhexidine Gluconate Cloth  6 each Topical Q0600  . clopidogrel  75 mg Oral Q breakfast  . doxercalciferol  1 mcg Intravenous Q M,W,F-HD  . feeding supplement (NEPRO CARB STEADY)  237 mL Oral BID BM  . isosorbide mononitrate  60 mg Oral Daily  . metoprolol tartrate  12.5 mg Oral BID  . sodium chloride flush  3 mL Intravenous Q12H  . sodium chloride flush  3 mL Intravenous Q12H   Continuous Infusions: . sodium chloride 7.2 mL/hr at 11/14/18 0430  . sodium chloride    . sodium chloride    . sodium chloride    . sodium chloride    . sodium chloride     PRN Meds: sodium chloride, sodium chloride, sodium chloride, sodium chloride, acetaminophen, ALPRAZolam, alteplase, heparin, hydrALAZINE, lidocaine (PF), lidocaine-prilocaine, nitroGLYCERIN, ondansetron (ZOFRAN) IV, oxyCODONE, pentafluoroprop-tetrafluoroeth, sodium chloride flush, sodium chloride flush, zolpidem   Vital Signs    Vitals:   11/14/18 2018 11/15/18 0200 11/15/18 0315 11/15/18 0548  BP: (!) 164/72 (!) 174/88 (!) 134/59 (!) 158/78  Pulse: 63   69  Resp: 15 18 14 11   Temp: 97.7 F (36.5 C)   98 F (36.7 C)  TempSrc: Oral   Oral  SpO2: 95%   98%  Weight:    58.8 kg  Height:        Intake/Output Summary (Last 24 hours) at 11/15/2018 0752 Last data filed at 11/14/2018 1600 Gross per 24 hour  Intake 239.51 ml  Output -  Net 239.51 ml   Filed Weights   11/13/18 0447 11/14/18 0532 11/15/18 0548  Weight: 57.6 kg 52.2 kg 58.8 kg    Telemetry    SR - Personally Reviewed  ECG    None today   Physical Exam   GEN: No acute distress.   Neck: No JVD Cardiac: RRR, no murmurs, rubs, or gallops.  Respiratory: Clear to  auscultation bilaterally. GI: Soft, nontender, non-distended  MS: No edema; No deformity. Neuro:  Nonfocal  Psych: Normal affect   Labs    Chemistry Recent Labs  Lab 11/11/18 0617  11/13/18 0754 11/13/18 1625 11/15/18 0653  NA 137   < > 136 136 137  K 6.2*   < > 6.0* 5.2* 5.3*  CL 91*   < > 95* 95* 95*  CO2 26   < > 30 27 26   GLUCOSE 65*   < > 65* 76 72  BUN 20   < > 17 24* 47*  CREATININE 5.82*   < > 4.94* 5.83* 9.85*  CALCIUM 9.6   < > 9.1 9.4 9.4  PROT 7.9  --   --   --   --   ALBUMIN 3.6  --   --   --  2.9*  AST 26  --   --   --   --   ALT 12  --   --   --   --   ALKPHOS 93  --   --   --   --   BILITOT 1.2  --   --   --   --   GFRNONAA 9*   < > 11* 9* 5*  GFRAA 10*   < >  12* 10* 5*  ANIONGAP 20*   < > 11 14 16*   < > = values in this interval not displayed.     Hematology Recent Labs  Lab 11/12/18 0008 11/13/18 0319 11/14/18 0322  WBC 5.7 5.2 4.8  RBC 4.56 4.61 4.32  HGB 12.5* 12.8* 11.9*  HCT 39.7 39.5 37.1*  MCV 87.1 85.7 85.9  MCH 27.4 27.8 27.5  MCHC 31.5 32.4 32.1  RDW 14.3 14.0 13.9  PLT 186 145* 164   Cardiac Enzymes Recent Labs  Lab 11/11/18 0617 11/11/18 1053 11/11/18 1338 11/12/18 0008  TROPONINI 0.04* 2.71* 8.21* 10.59*    Radiology    Dg Foot Complete Left  Result Date: 11/14/2018 CLINICAL DATA:  Chronic plantar pain EXAM: LEFT FOOT - COMPLETE 3+ VIEW COMPARISON:  LEFT great toe radiographs 02/03/2013 FINDINGS: Marked osseous demineralization. Joint spaces preserved. No acute fracture, dislocation, or bone destruction. IMPRESSION: No acute osseous abnormalities. Electronically Signed   By: Lavonia Dana M.D.   On: 11/14/2018 17:55    Cardiac Studies   Cath 11/11/18 Unsuccessful attempts at cardiac catheterization from the left groin, right groin, and right radial artery.  Patient with severe peripheral arterial disease with previous axillary bifemoral grafting and severe diffuse atherosclerosis with left arm hemodialysis  access.  Recommend medical therapy.  Discussed with patient's daughter.  Echo 11/12/18 Study Conclusions  - Left ventricle: The cavity size was normal. Wall thickness was   increased in a pattern of moderate LVH. There was moderate   concentric hypertrophy. Systolic function was mildly reduced. The   estimated ejection fraction was in the range of 45% to 50%.   Diffuse hypokinesis. Doppler parameters are consistent with   abnormal left ventricular relaxation (grade 1 diastolic   dysfunction). - Mitral valve: Calcified annulus. There was no significant   regurgitation. - Left atrium: The atrium was mildly dilated. - Pericardium, extracardiac: A trivial pericardial effusion was   identified posterior to the heart.  Impressions:  - Mildly reduced LV EF, confirmed with 2D tracking. Diffuse   hypokinesis, no regional wall motion abnormalities. Trivial   posterior pericardial effusion. Overall similar to prior.  Patient Profile     Trevor [name] is a 72 y.o. 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 Donzetta Matters) presented with chest pain and ruled in.   Assessment & Plan    1. NSTEMI - Peak of troponin 10.5. Unfortunately, unable to access an arteriotomy site due to severe underlying peripheral vascular disease. Plan medical management. Echo with LVEFof 45-50% (similar to prior). Diffuse hypokinesis, no regional wall motion abnormalities.  - Continue Plavix, Lopressor, Lipitor, Imdur.  2. Labile hypertension  - Likely fluctuating due to volume. Appreciate nephology input. May be add hydralazine/amlodipine on non-HD days if needed. Watch BP after dialysis today and may be discharge later today.   3. ESRD on HD on MWF - Currently undergoing dialysis.   For questions or updates, please contact [place] Please consult [email or web address] for contact info under    SignedLeanor Kail, PA  11/15/2018, 7:52 AM    Patient seen, examined. Available data reviewed. Agree with findings, assessment, and plan as outlined by Robbie Lis, PA.  The patient is independently interviewed and examined.  He is seen in the hemodialysis unit.  He is just completing his dialysis session and he has done well  per nursing staff without hemodynamic shifts or chest pain.  He is an elderly male in no distress.  JVP is normal, lung fields are clear, heart is regular rate and rhythm without murmur or gallop, abdomen is soft and nontender, extremities are thin with no edema.  Neurologic exam is nonfocal.  The patient has had a non-ST elevation infarction, EKG is reviewed and demonstrates marked LVH with repolarization abnormality.  LV EF is mildly depressed by echo with global LV dysfunction and no regional wall motion abnormalities.  Medical therapy is limited by the fact that he is on hemodialysis with labile blood pressures.  The patient is currently tolerating therapy with isosorbide, metoprolol at low-dose, clopidogrel, and a high intensity statin drug with atorvastatin 80 mg daily.  I think he is medically stable for discharge.  The patient is a skilled nursing facility resident and we will start to work on his discharge.  Sherren Mocha, M.D. 11/15/2018 10:57 AM

## 2018-11-15 NOTE — Discharge Summary (Signed)
Discharge Summary    Patient ID: Trevor [name] MRN: [id number]; DOB: 1946/10/28  Admit date: 11/11/2018 Discharge date: 11/15/2018  Primary Care Provider: [name]  Primary Cardiologist: Kirk Ruths, MD   Discharge Diagnoses    Principal Problem:   NSTEMI (non-ST elevated myocardial infarction) [facility]) Active Problems:   History of CVA (cerebrovascular accident)   End-stage renal disease on hemodialysis [facility])   Abdominal aortic aneurysm ([place])   Dyslipidemia   Essential hypertension   Ischemic chest pain ([place])   Vascular device, implant, or graft complication   Allergies No Known Allergies  Diagnostic Studies/Procedures    Cath 11/11/18 Unsuccessful attempts at cardiac catheterization from the left groin, right groin, and right radial artery. Patient with severe peripheral arterial disease with previous axillary bifemoral grafting and severe diffuse atherosclerosis with left arm hemodialysis access.  Recommend medical therapy. Discussed with patient's daughter.  Echo 11/12/18 Study Conclusions  - Left ventricle: The cavity size was normal. Wall thickness was increased in a pattern of moderate LVH. There was moderate concentric hypertrophy. Systolic function was mildly reduced. The estimated ejection fraction was in the range of 45% to 50%. Diffuse hypokinesis. Doppler parameters are consistent with abnormal left ventricular relaxation (grade 1 diastolic dysfunction). - Mitral valve: Calcified annulus. There was no significant regurgitation. - Left atrium: The atrium was mildly dilated. - Pericardium, extracardiac: A trivial pericardial effusion was identified posterior to the heart.  Impressions:  - Mildly reduced LV EF, confirmed with 2D tracking. Diffuse hypokinesis, no regional wall motion abnormalities. Trivial posterior pericardial effusion. Overall similar to prior.   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 Donzetta Matters) presented with chest pain and ruled in.   Hospital Course     Consultants: nephrology   1. NSTEMI - EKG demonstrated marked LVH with repolarization abnormality. Peak of troponin 10.5. Unfortunately,unable to access an arteriotomy site due to severe underlying peripheral vascular disease. treated with 48 hours of heparin. Plan medical management. Echo with LVEFof 45-50% (similar to prior). Diffuse hypokinesis, no regional wall motion abnormalities.  - Medical therapy is limited by the fact that he is on hemodialysis with labile blood pressures.   - Continue ASA, Plavix, Lopressor, Lipitor, Imdur. Patient was on DAPT prior to arrival, Dr. Burt Knack recommended continuation.   2. Labile hypertension  - Likely fluctuating due to volume. The patient is currently tolerating therapy with isosorbide and metoprolol at low-dose. Plan to follow as outpatient.   3. ESRD on HD on MWF - Compliant   She has been seen by Dr. Burt Knack today and deemed ready for discharge home. All follow-up appointments have been scheduled. Discharge medications are listed below.   Discharge Vitals Blood pressure (!) 166/72, pulse 78, temperature 97.9 F (36.6 C), temperature source Oral, resp. rate 11, height '5\' 10"'$  (1.778 m), weight 56.3 kg, SpO2 99 %.  Filed Weights   11/15/18 0548 11/15/18 0635 11/15/18 1044  Weight: 58.8 kg 56.3 kg 56.3 kg    Labs & Radiologic Studies    CBC Recent Labs    11/14/18 0322 11/15/18 0913  WBC 4.8 3.3*  HGB 11.9* 11.6*  HCT 37.1* 34.9*  MCV 85.9 85.3  PLT 164 532*   Basic Metabolic Panel Recent Labs    11/13/18 1625 11/15/18 0653  NA  136 137  K 5.2* 5.3*  CL 95* 95*  CO2 27 26  GLUCOSE 76 72  BUN 24* 47*  CREATININE 5.83* 9.85*  CALCIUM 9.4 9.4  PHOS  --  7.1*   Liver Function  Tests Recent Labs    11/15/18 0653  ALBUMIN 2.9*  _____________  Dg Chest Port 1 View  Result Date: 11/11/2018 CLINICAL DATA:  Chest pain EXAM: PORTABLE CHEST 1 VIEW COMPARISON:  09/20/2018 FINDINGS: Cardiac shadow is at the upper limits of normal but accentuated by the portable technique. Diffuse aortic calcifications are again identified. The lungs are well aerated bilaterally. Previously seen infiltrative changes have resolved. No sizable effusion is seen. No bony abnormality is noted. IMPRESSION: No acute abnormality seen. Electronically Signed   By: Inez Catalina M.D.   On: 11/11/2018 07:23   Dg Foot Complete Left  Result Date: 11/14/2018 CLINICAL DATA:  Chronic plantar pain EXAM: LEFT FOOT - COMPLETE 3+ VIEW COMPARISON:  LEFT great toe radiographs 02/03/2013 FINDINGS: Marked osseous demineralization. Joint spaces preserved. No acute fracture, dislocation, or bone destruction. IMPRESSION: No acute osseous abnormalities. Electronically Signed   By: Lavonia Dana M.D.   On: 11/14/2018 17:55   Disposition   Pt is being discharged home today in good condition.  Follow-up Plans & Appointments    Follow-up Information    Erlene Quan, [place]. Go on 11/23/2018.   Specialties:  Cardiology, Radiology Why:  '@8'$ :30am for hospital follow up  Contact information: [address] [place] [address] [phone]          Discharge Instructions    Amb Referral to Cardiac Rehabilitation   Complete by:  As directed    Diagnosis:  NSTEMI   Diet - low sodium heart healthy   Complete by:  As directed    Increase activity slowly   Complete by:  As directed       Discharge Medications   Allergies as of 11/15/2018   No Known Allergies     Medication List    STOP taking these medications   amLODipine 10 MG tablet Commonly known as:  NORVASC   aspirin 81 MG chewable tablet Replaced by:  aspirin EC 81 MG tablet   hydrALAZINE 50 MG tablet Commonly known as:   APRESOLINE     TAKE these medications   aspirin EC 81 MG tablet Take 1 tablet (81 mg total) by mouth daily. Replaces:  aspirin 81 MG chewable tablet   atorvastatin 80 MG tablet Commonly known as:  LIPITOR Take 1 tablet (80 mg total) by mouth at bedtime. What changed:    medication strength  how much to take   bisacodyl 10 MG suppository Commonly known as:  DULCOLAX Place 10 mg rectally daily as needed (constipation).   cinacalcet 90 MG tablet Commonly known as:  SENSIPAR Take 90 mg by mouth daily with supper.   clopidogrel 75 MG tablet Commonly known as:  PLAVIX Take 1 tablet (75 mg total) by mouth daily.   docusate sodium 100 MG capsule Commonly known as:  COLACE Take 200 mg by mouth at bedtime.   doxercalciferol 4 MCG/2ML injection Commonly known as:  HECTOROL Inject 1 mL (2 mcg total) into the vein every Monday, Wednesday, and Friday with hemodialysis.   ENSURE PO Take 240 mLs by mouth daily.   feeding supplement (NEPRO CARB STEADY) Liqd Take 237 mLs by mouth 2 (two) times daily between meals.   ferric citrate 1 GM 210 MG(Fe) tablet Commonly known as:  AURYXIA Take 630 mg by mouth 3 (three) times daily with meals.   gabapentin 300 MG capsule Commonly known as:  NEURONTIN Take 1 capsule (300 mg total) by mouth at bedtime.   HYDROcodone-acetaminophen 5-325 MG tablet Commonly known as:  NORCO Take 1 tablet by mouth every 6 (six) hours as needed for moderate pain.   ipratropium-albuterol 0.5-2.5 (3) MG/3ML Soln Commonly known as:  DUONEB Take 3 mLs by nebulization every 8 (eight) hours as needed (for copd).   isosorbide mononitrate 60 MG 24 hr tablet Commonly known as:  IMDUR Take 1 tablet (60 mg total) by mouth daily. What changed:    medication strength  how much to take   [facility] MAGNAIR REFILL KIT 25 MCG/ML Soln Generic drug:  Glycopyrrolate Inhale 25 mcg into the lungs 2 (two) times daily.   metoprolol tartrate 25 MG tablet Commonly known  as:  LOPRESSOR Take 0.5 tablets (12.5 mg total) by mouth 2 (two) times daily. What changed:  how much to take   mirtazapine 7.5 MG tablet Commonly known as:  REMERON Take 7.5 mg by mouth at bedtime.   nitroGLYCERIN 0.4 MG SL tablet Commonly known as:  NITROSTAT Place 1 tablet (0.4 mg total) under the tongue every 5 (five) minutes x 3 doses as needed for chest pain.   ondansetron 4 MG tablet Commonly known as:  ZOFRAN Take 4 mg by mouth every 6 (six) hours as needed for nausea or vomiting.   senna 8.6 MG Tabs tablet Commonly known as:  SENOKOT Take 1 tablet by mouth daily.   VASCEPA 1 g Caps Generic drug:  Icosapent Ethyl Take 1 g by mouth 2 (two) times daily.        Acute coronary syndrome (MI, NSTEMI, STEMI, etc) this admission?: Yes.     AHA/ACC Clinical Performance & Quality Measures: 1. Aspirin prescribed? - Yes 2. ADP Receptor Inhibitor (Plavix/Clopidogrel, Brilinta/Ticagrelor or Effient/Prasugrel) prescribed (includes medically managed patients)? - Yes 3. Beta Blocker prescribed? - Yes 4. High Intensity Statin (Lipitor 40-29m or Crestor 20-412m prescribed? - Yes 5. EF assessed during THIS hospitalization? - Yes 6. For EF <40%, was ACEI/ARB prescribed? - No - Reason:  Hx of ESRD on HD 7. For EF <40%, Aldosterone Antagonist (Spironolactone or Eplerenone) prescribed? - No - Reason:  on HD 8. Cardiac Rehab Phase II ordered (Included Medically managed Patients)? - No - No intervention, lives in long term care facility      Outstanding Labs/Studies   None  Duration of Discharge Encounter   Greater than 30 minutes including physician time.  SiJarrett SohoPA 11/15/2018, 1:13 PM

## 2018-11-15 NOTE — Progress Notes (Signed)
BP 174/88. Text paged Dr. Hassell Done. Received orders for 10mg  hydralazine IV. Will continue to monitor.

## 2018-11-15 NOTE — Consult Note (Signed)
   [facility] CM Inpatient Consult   11/15/2018  Trevor [name] [birth year] [id number]  Patient screened for [facility] services due to unplanned readmission risk score of 41% and multiple hospitalizations.   Chart reviewed. Patient is resident at [place] [facility]. No [facility] CM needs at this time.  [name], MSN, [facility] Liaison Nurse Mobile Phone [phone]  Toll free office [phone]

## 2018-11-15 NOTE — Telephone Encounter (Signed)
Currently admitted.

## 2018-11-15 NOTE — Clinical Social Work Note (Signed)
Clinical Social Work Assessment  Patient Details  Name: Trevor [name] MRN: [id number] Date of Birth: 01-12-1946  Date of referral:  11/15/18               Reason for consult:  Discharge Planning, Facility Placement                Permission sought to share information with:  Facility Sport and exercise psychologist, Family Supports Permission granted to share information::  Yes, Verbal Permission Granted  Name::     Trevor [name]::  [name]  Relationship::  daughter  Contact Information:  [phone]  Housing/Transportation Living arrangements for the past 2 months:  [name] of Information:  Patient, Facility Patient Interpreter Needed:  None Criminal Activity/Legal Involvement Pertinent to Current Situation/Hospitalization:  No - Comment as needed Significant Relationships:  Adult Children Lives with:  Facility Resident Do you feel safe going back to the place where you live?  Yes Need for family participation in patient care:  No (Coment)  Care giving concerns: Patient is a long term care resident at [name].    Social Worker assessment / plan: CSW met with patient at bedside. Patient alert and oriented. CSW introduced self and role and discussed disposition planning. Patient reported he has lived at [name] for about eight months, and prior to that was at [name]. Patient is agreeable to return to [name].   Patient requested CSW call his son, Trevor [name], and daughter, Trevor [name], regarding his discharge.    Per MD, patient ready for discharge today. CSW to support with discharge planning.  Employment status:  Retired Forensic scientist:  [employer] PT Recommendations:  [place] / Referral to community resources:  [name]  Patient/Family's Response to care: Patient appreciative of care.  Patient/Family's Understanding of and Emotional Response to Diagnosis, Current Treatment, and  Prognosis: Patient with understanding of his conditions and agreeable to return to [name] at discharge.  Emotional Assessment Appearance:  Appears stated age Attitude/Demeanor/Rapport:  Engaged Affect (typically observed):  Accepting, Calm, Appropriate, Pleasant Orientation:  Oriented to Self, Oriented to Place, Oriented to  Time, Oriented to Situation Alcohol / Substance use:  Not Applicable Psych involvement (Current and /or in the community):  No (Comment)  Discharge Needs  Concerns to be addressed:  Discharge Planning Concerns, Care Coordination Readmission within the last 30 days:  No Current discharge risk:  Physical Impairment Barriers to Discharge:  No Barriers Identified   Estanislado Emms, LCSW 11/15/2018, 1:04 PM

## 2018-11-15 NOTE — Progress Notes (Addendum)
[facility] Progress Note   Subjective:  Seen in HD unit. Even UF today. Tolerating HD. Denies CP/SOB. Has some c/o pain in sole of L foot.    Objective Vitals:   11/15/18 0730 11/15/18 0800 11/15/18 0830 11/15/18 0900  BP: (!) 161/76 (!) 157/80 (!) 160/77 (!) 136/56  Pulse: 70 72 78 69  Resp:      Temp:      TempSrc:      SpO2:      Weight:      Height:       Physical Exam General: Thin frail appearing male NAD Heart: RRR Lungs: CTAB Abdomen: soft NT/ND Extremities: No LE edema  Dialysis Access: LUE AVF   Dialysis Orders:  MWF [facility] 4h  400/800  56kg  2/2.25  P4  AVF  Hep none - Hectoral 60mcg IV q HD  Assessment/Plan: 1. ACS/diffuse ST depression: failed LHC attempt d/t severe PAD. Medical Rx per cards. Max trop 10. On Plavix, Lopressor, Lipitor, Imdur 2. ESRD - MWF. Continue on schedule  3. Anemia - Hgb stable. No ESA needs.  4. 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 Continue Sensipar.  Phos elevated. Resume Auryxia binder.  5. HTN/volume - BP labile. Meds as above. No volume excess on exam. Pre HD wt 56.3kg. Challenge EDW next HD if BP remains up.  6. Nutrition -  Alb low. On Nepro  7. Hx AAA 8. Hx CVA 9. Blindness  10. L foot pain - Normal exam. No acute abnormality on xray 12/15.  Lynnda Child PA-C [facility] Pager [phone] 11/15/2018,10:14 AM  LOS: 4 days   Pt seen, examined and agree w A/P as above.  Kelly Splinter MD [facility] pager [phone]   11/15/2018, 11:40 AM    Additional Objective Labs: Basic Metabolic Panel: Recent Labs  Lab 11/13/18 0754 11/13/18 1625 11/15/18 0653  NA 136 136 137  K 6.0* 5.2* 5.3*  CL 95* 95* 95*  CO2 30 27 26   GLUCOSE 65* 76 72  BUN 17 24* 47*  CREATININE 4.94* 5.83* 9.85*  CALCIUM 9.1 9.4 9.4  PHOS  --   --  7.1*   CBC: Recent Labs  Lab 11/11/18 0617  11/12/18 0008 11/13/18 0319 11/14/18 0322  WBC 6.6  --  5.7 5.2 4.8  NEUTROABS 3.6  --   --    --   --   HGB 14.2   < > 12.5* 12.8* 11.9*  HCT 44.4   < > 39.7 39.5 37.1*  MCV 89.5  --  87.1 85.7 85.9  PLT 164  --  186 145* 164   < > = values in this interval not displayed.   Blood Culture    Component Value Date/Time   SDES HEMODIALYSIS LINE 12/27/2015 1340   SPECREQUEST BOTTLES DRAWN AEROBIC AND ANAEROBIC 10MLS 12/27/2015 1340   CULT NO GROWTH 5 DAYS 12/27/2015 1340   REPTSTATUS 01/01/2016 FINAL 12/27/2015 1340    Cardiac Enzymes: Recent Labs  Lab 11/11/18 0617 11/11/18 1053 11/11/18 1338 11/12/18 0008  TROPONINI 0.04* 2.71* 8.21* 10.59*   CBG: No results for input(s): GLUCAP in the last 168 hours. Iron Studies: No results for input(s): IRON, TIBC, TRANSFERRIN, FERRITIN in the last 72 hours. Lab Results  Component Value Date   INR 1.04 11/11/2018   INR 1.07 06/29/2017   INR 1.06 01/24/2017   Medications: . sodium chloride 7.2 mL/hr at 11/14/18 0430  . sodium chloride    . sodium  chloride    . sodium chloride    . sodium chloride    . sodium chloride     . atorvastatin  80 mg Oral q1800  . Chlorhexidine Gluconate Cloth  6 each Topical Q0600  . clopidogrel  75 mg Oral Q breakfast  . doxercalciferol      . doxercalciferol  1 mcg Intravenous Q M,W,F-HD  . feeding supplement (NEPRO CARB STEADY)  237 mL Oral BID BM  . isosorbide mononitrate  60 mg Oral Daily  . metoprolol tartrate  12.5 mg Oral BID  . sodium chloride flush  3 mL Intravenous Q12H  . sodium chloride flush  3 mL Intravenous Q12H

## 2018-11-15 NOTE — Telephone Encounter (Signed)
TOC Patient-   Please call Patient-  Pt has an appointment with [name] on 11-23-18.

## 2018-11-15 NOTE — Discharge Instructions (Signed)
Femoral Site Care Refer to this sheet in the next few weeks. These instructions provide you with information about caring for yourself after your procedure. Your health care provider may also give you more specific instructions. Your treatment has been planned according to current medical practices, but problems sometimes occur. Call your health care provider if you have any problems or questions after your procedure. What can I expect after the procedure? After your procedure, it is typical to have the following:  Bruising at the site that usually fades within 1-2 weeks.  Blood collecting in the tissue (hematoma) that may be painful to the touch. It should usually decrease in size and tenderness within 1-2 weeks.  Follow these instructions at home:  Take medicines only as directed by your health care provider.  You may shower 24-48 hours after the procedure or as directed by your health care provider. Remove the bandage (dressing) and gently wash the site with plain soap and water. Pat the area dry with a clean towel. Do not rub the site, because this may cause bleeding.  Do not take baths, swim, or use a hot tub until your health care provider approves.  Check your insertion site every day for redness, swelling, or drainage.  Do not apply powder or lotion to the site.  Limit use of stairs to twice a day for the first 2-3 days or as directed by your health care provider.  Do not squat for the first 2-3 days or as directed by your health care provider.  Do not lift over 10 lb (4.5 kg) for 5 days after your procedure or as directed by your health care provider.  Ask your health care provider when it is okay to: ? Return to work or school. ? Resume usual physical activities or sports. ? Resume sexual activity.  Do not drive home if you are discharged the same day as the procedure. Have someone else drive you.  You may drive 24 hours after the procedure unless otherwise instructed by  your health care provider.  Do not operate machinery or power tools for 24 hours after the procedure or as directed by your health care provider.  If your procedure was done as an outpatient procedure, which means that you went home the same day as your procedure, a responsible adult should be with you for the first 24 hours after you arrive home.  Keep all follow-up visits as directed by your health care provider. This is important. Contact a health care provider if:  You have a fever.  You have chills.  You have increased bleeding from the site. Hold pressure on the site. Get help right away if:  You have unusual pain at the site.  You have redness, warmth, or swelling at the site.  You have drainage (other than a small amount of blood on the dressing) from the site.  The site is bleeding, and the bleeding does not stop after 30 minutes of holding steady pressure on the site.  Your leg or foot becomes pale, cool, tingly, or numb. This information is not intended to replace advice given to you by your health care provider. Make sure you discuss any questions you have with your health care provider. Document Released: 07/21/2014 Document Revised: 04/24/2016 Document Reviewed: 06/06/2014 Elsevier Interactive Patient Education  2018 [place] Refer to this sheet in the next few weeks. These instructions provide you with information about caring for yourself after your procedure. Your health  care provider may also give you more specific instructions. Your treatment has been planned according to current medical practices, but problems sometimes occur. Call your health care provider if you have any problems or questions after your procedure. What can I expect after the procedure? After your procedure, it is typical to have the following:  Bruising at the radial site that usually fades within 1-2 weeks.  Blood collecting in the tissue (hematoma) that may be  painful to the touch. It should usually decrease in size and tenderness within 1-2 weeks.  Follow these instructions at home:  Take medicines only as directed by your health care provider.  You may shower 24-48 hours after the procedure or as directed by your health care provider. Remove the bandage (dressing) and gently wash the site with plain soap and water. Pat the area dry with a clean towel. Do not rub the site, because this may cause bleeding.  Do not take baths, swim, or use a hot tub until your health care provider approves.  Check your insertion site every day for redness, swelling, or drainage.  Do not apply powder or lotion to the site.  Do not flex or bend the affected arm for 24 hours or as directed by your health care provider.  Do not push or pull heavy objects with the affected arm for 24 hours or as directed by your health care provider.  Do not lift over 10 lb (4.5 kg) for 5 days after your procedure or as directed by your health care provider.  Ask your health care provider when it is okay to: ? Return to work or school. ? Resume usual physical activities or sports. ? Resume sexual activity.  Do not drive home if you are discharged the same day as the procedure. Have someone else drive you.  You may drive 24 hours after the procedure unless otherwise instructed by your health care provider.  Do not operate machinery or power tools for 24 hours after the procedure.  If your procedure was done as an outpatient procedure, which means that you went home the same day as your procedure, a responsible adult should be with you for the first 24 hours after you arrive home.  Keep all follow-up visits as directed by your health care provider. This is important. Contact a health care provider if:  You have a fever.  You have chills.  You have increased bleeding from the radial site. Hold pressure on the site. Get help right away if:  You have unusual pain at the  radial site.  You have redness, warmth, or swelling at the radial site.  You have drainage (other than a small amount of blood on the dressing) from the radial site.  The radial site is bleeding, and the bleeding does not stop after 30 minutes of holding steady pressure on the site.  Your arm or hand becomes pale, cool, tingly, or numb. This information is not intended to replace advice given to you by your health care provider. Make sure you discuss any questions you have with your health care provider. Document Released: 12/20/2010 Document Revised: 04/24/2016 Document Reviewed: 06/05/2014 Elsevier Interactive Patient Education  2018 [employer].

## 2018-11-16 ENCOUNTER — Telehealth (HOSPITAL_COMMUNITY): Payer: Self-pay

## 2018-11-16 DIAGNOSIS — I214 Non-ST elevation (NSTEMI) myocardial infarction: Secondary | ICD-10-CM | POA: Diagnosis not present

## 2018-11-16 DIAGNOSIS — N186 End stage renal disease: Secondary | ICD-10-CM | POA: Diagnosis not present

## 2018-11-16 DIAGNOSIS — I10 Essential (primary) hypertension: Secondary | ICD-10-CM | POA: Diagnosis not present

## 2018-11-16 DIAGNOSIS — I639 Cerebral infarction, unspecified: Secondary | ICD-10-CM | POA: Diagnosis not present

## 2018-11-16 DIAGNOSIS — F4321 Adjustment disorder with depressed mood: Secondary | ICD-10-CM | POA: Diagnosis not present

## 2018-11-16 NOTE — Telephone Encounter (Signed)
Per phrase I, w/c bound, do not call.  Closed referral

## 2018-11-16 NOTE — Telephone Encounter (Signed)
LEFT MESSAGE TO CALL BACK

## 2018-11-18 DIAGNOSIS — I10 Essential (primary) hypertension: Secondary | ICD-10-CM | POA: Diagnosis not present

## 2018-11-18 DIAGNOSIS — N186 End stage renal disease: Secondary | ICD-10-CM | POA: Diagnosis not present

## 2018-11-18 DIAGNOSIS — I214 Non-ST elevation (NSTEMI) myocardial infarction: Secondary | ICD-10-CM | POA: Diagnosis not present

## 2018-11-18 DIAGNOSIS — I639 Cerebral infarction, unspecified: Secondary | ICD-10-CM | POA: Diagnosis not present

## 2018-11-18 NOTE — Telephone Encounter (Signed)
Pt is resident at [facility] at [place], [place] to give f/u apt info for 11/23/18 at 830 am with [name]  PA-C

## 2018-11-22 ENCOUNTER — Ambulatory Visit (INDEPENDENT_AMBULATORY_CARE_PROVIDER_SITE_OTHER): Payer: Medicare Other | Admitting: Cardiology

## 2018-11-22 ENCOUNTER — Encounter: Payer: Self-pay | Admitting: Cardiology

## 2018-11-22 DIAGNOSIS — I214 Non-ST elevation (NSTEMI) myocardial infarction: Secondary | ICD-10-CM

## 2018-11-22 DIAGNOSIS — Z992 Dependence on renal dialysis: Secondary | ICD-10-CM

## 2018-11-22 DIAGNOSIS — I10 Essential (primary) hypertension: Secondary | ICD-10-CM

## 2018-11-22 DIAGNOSIS — N186 End stage renal disease: Secondary | ICD-10-CM | POA: Diagnosis not present

## 2018-11-22 DIAGNOSIS — I2510 Atherosclerotic heart disease of native coronary artery without angina pectoris: Secondary | ICD-10-CM | POA: Diagnosis not present

## 2018-11-22 MED ORDER — ISOSORBIDE MONONITRATE ER 30 MG PO TB24: 30.0000 mg | ORAL_TABLET | Freq: Every day | ORAL | 3 refills | Status: DC

## 2018-11-22 NOTE — Assessment & Plan Note (Signed)
12/19-12/16/19 with NSTEMI- Troponin 10. Unable to obtain vascular access for cath- medical Rx. EF 45-50% by echo

## 2018-11-22 NOTE — Assessment & Plan Note (Signed)
Hypotensive today but tolerating it well

## 2018-11-22 NOTE — Assessment & Plan Note (Signed)
HD since 2014, MWF at [facility]- Dr Marval Regal follows.

## 2018-11-22 NOTE — Patient Instructions (Signed)
Medication Instructions:   Stop taking IMDUR ( ISOSORBIDE MONO) 60 MG   START TAKING IMDUR ( ISOSORBIDE MONO) 30 MG BY MOUTH DAILY   If you need a refill on your cardiac medications before your next appointment, please call your pharmacy.   Lab work:no labs needed If you have labs (blood work) drawn today and your tests are completely normal, you will receive your results only by: Marland Kitchen MyChart Message (if you have MyChart) OR . A paper copy in the mail If you have any lab test that is abnormal or we need to change your treatment, we will call you to review the results.  Testing/Procedures: Not needed  Follow-Up: At [facility], you and your health needs are our priority.  As part of our continuing mission to provide you with exceptional heart care, we have created designated Provider Care Teams.  These Care Teams include your primary Cardiologist (physician) and Advanced Practice Providers (APPs -  Physician Assistants and Nurse Practitioners) who all work together to provide you with the care you need, when you need it. You will need a follow up appointment in 3 months.  Please call our office 2 months in advance to schedule this appointment.  You may see Kirk Ruths, MD or one of the following Advanced Practice Providers on your designated Care Team:   [name], PA-C Roby Lofts, [place] . Sande Rives, PA-C  Any Other Special Instructions Will Be Listed Below (If Applicable). n/a

## 2018-11-22 NOTE — Assessment & Plan Note (Signed)
60% Dx1 and 60% CFX at cath 2013-2015-and Dec 2017

## 2018-11-22 NOTE — Progress Notes (Signed)
11/22/2018 Trevor [name]   06/09/1946  [id number]  Primary Physician Clinic, [name] Primary Cardiologist: Dr Stanford Breed  HPI:  72 y/o blind AA male with a history of ESRD on HD, seen today as a TOC follow up. He resides at [facility] facility.  He had dialysis yesterday because of the Holiday, his usual days are MWF at [facility].  He was admitted 11/11/18 with chest pain and ruled in for a NSTEMI. His troponin peaked at 10.59. Coronary angiogram was attempted but they were unable to get access. Echo showed his EF to be 45-50% with moderate LVH. In the office today he denies chest pain. His B/P is low 58'I systolic in his Rt arm, his AVF is in his Lt arm. He denies chest pain. He is sleepy but answers appropriately. I offered to sen him to the ED for evaluation and possibly IV hydration but he declined.    Current Outpatient Medications  Medication Sig Dispense Refill  . aspirin EC 81 MG tablet Take 1 tablet (81 mg total) by mouth daily. 90 tablet 3  . atorvastatin (LIPITOR) 80 MG tablet Take 1 tablet (80 mg total) by mouth at bedtime. 30 tablet 6  . bisacodyl (DULCOLAX) 10 MG suppository Place 10 mg rectally daily as needed (constipation).     . cinacalcet (SENSIPAR) 90 MG tablet Take 90 mg by mouth daily with supper.    . clopidogrel (PLAVIX) 75 MG tablet Take 1 tablet (75 mg total) by mouth daily. 30 tablet 2  . docusate sodium (COLACE) 100 MG capsule Take 200 mg by mouth at bedtime.     Marland Kitchen doxercalciferol (HECTOROL) 4 MCG/2ML injection Inject 1 mL (2 mcg total) into the vein every Monday, Wednesday, and Friday with hemodialysis. 2 mL   . ferric citrate (AURYXIA) 1 GM 210 MG(Fe) tablet Take 630 mg by mouth 3 (three) times daily with meals.     . gabapentin (NEURONTIN) 300 MG capsule Take 1 capsule (300 mg total) by mouth at bedtime. 30 capsule 0  . Glycopyrrolate (LONHALA MAGNAIR REFILL KIT) 25 MCG/ML SOLN Inhale 25 mcg into the lungs 2 (two) times daily.    Marland Kitchen  HYDROcodone-acetaminophen (NORCO) 5-325 MG tablet Take 1 tablet by mouth every 6 (six) hours as needed for moderate pain. 12 tablet 0  . Icosapent Ethyl (VASCEPA) 1 g CAPS Take 1 g by mouth 2 (two) times daily.     Marland Kitchen ipratropium-albuterol (DUONEB) 0.5-2.5 (3) MG/3ML SOLN Take 3 mLs by nebulization every 8 (eight) hours as needed (for copd).    . metoprolol tartrate (LOPRESSOR) 25 MG tablet Take 0.5 tablets (12.5 mg total) by mouth 2 (two) times daily. 30 tablet 5  . mirtazapine (REMERON) 7.5 MG tablet Take 7.5 mg by mouth at bedtime.    . nitroGLYCERIN (NITROSTAT) 0.4 MG SL tablet Place 1 tablet (0.4 mg total) under the tongue every 5 (five) minutes x 3 doses as needed for chest pain. 25 tablet 12  . Nutritional Supplements (ENSURE PO) Take 240 mLs by mouth daily.    . Nutritional Supplements (FEEDING SUPPLEMENT, NEPRO CARB STEADY,) LIQD Take 237 mLs by mouth 2 (two) times daily between meals.  0  . ondansetron (ZOFRAN) 4 MG tablet Take 4 mg by mouth every 6 (six) hours as needed for nausea or vomiting.    . senna (SENOKOT) 8.6 MG TABS tablet Take 1 tablet by mouth daily.    . isosorbide mononitrate (IMDUR) 30 MG 24 hr tablet Take 1 tablet (  30 mg total) by mouth daily. 90 tablet 3   No current facility-administered medications for this visit.     No Known Allergies  Past Medical History:  Diagnosis Date  . AAA (abdominal aortic aneurysm) ([place])   . Anemia   . Anxiety   . Arthritis    arms and back  . Blindness   . CHF (congestive heart failure) ([place])   . Coronary artery disease    mild   . 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 ESRD on hemodialysis ([place])    started HD 2014-15  . Headache(784.0)   . HTN (hypertension)   . Protein-calorie malnutrition ([place])   . STEMI (ST elevation myocardial infarction) ([place]) 11/11/2018  . Stroke [facility]) 01/2018   no residual deficits    Social History   Socioeconomic History  . Marital status:  Widowed    Spouse name: Not on file  . Number of children: 2  . Years of education: Not on file  . Highest education level: Not on file  Occupational History  . Not on file  Social Needs  . Financial resource strain: Not on file  . Food insecurity:    Worry: Not on file    Inability: Not on file  . Transportation needs:    Medical: Not on file    Non-medical: Not on file  Tobacco Use  . Smoking status: Current Every Day Smoker    Packs/day: 0.50    Years: 60.00    Pack years: 30.00    Types: Cigarettes  . Smokeless tobacco: Never Used  . Tobacco comment: 5-6 cigarettes per day  Substance and Sexual Activity  . Alcohol use: No    Alcohol/week: 0.0 standard drinks    Comment: Occasional  . Drug use: No    Frequency: 2.0 times per week    Types: "Crack" cocaine  . Sexual activity: Yes  Lifestyle  . Physical activity:    Days per week: Not on file    Minutes per session: Not on file  . Stress: Not on file  Relationships  . Social connections:    Talks on phone: Not on file    Gets together: Not on file    Attends religious service: Not on file    Active member of club or organization: Not on file    Attends meetings of clubs or organizations: Not on file    Relationship status: Not on file  . Intimate partner violence:    Fear of current or ex partner: Not on file    Emotionally abused: Not on file    Physically abused: Not on file    Forced sexual activity: Not on file  Other Topics Concern  . Not on file  Social History Narrative   Used to work in a Loss adjuster, chartered.      Family History  Problem Relation Age of Onset  . Heart attack Mother        MI in her 57s  . Diabetes Mother   . Alcohol abuse Father   . Anesthesia problems Neg Hx   . Hypotension Neg Hx   . Malignant hyperthermia Neg Hx   . Pseudochol deficiency Neg Hx      Review of Systems: General: negative for chills, fever, night sweats or weight changes.  Cardiovascular: negative for chest  pain, dyspnea on exertion, edema, orthopnea, palpitations, paroxysmal nocturnal dyspnea or shortness of breath All other  systems reviewed and are otherwise negative except as noted above.    Blood pressure (!) 75/44, pulse 76.  General appearance: cooperative, appears older than stated age, cachectic, no distress and blind Lungs: clear to auscultation bilaterally Heart: regular rate and rhythm Skin: warm and dry Neurologic: Grossly normal  EKG NSR, LVH  ASSESSMENT AND PLAN:   NSTEMI (non-ST elevated myocardial infarction) ([place]) 12/19-12/16/19 with NSTEMI- Troponin 10. Unable to obtain vascular access for cath- medical Rx. EF 45-50% by echo  Coronary artery disease, non-occlusive:  60% Dx1 and 60% CFX at cath 2013-2015-and Dec 2017  Essential hypertension Hypotensive today but tolerating it well  End-stage renal disease on hemodialysis ([place]) HD since 2014, MWF at [facility]- Dr Marval Regal follows.   PLAN  Decrease Imdur to 30 mg, continue Lopressor 12.5 mg BID, ASA 81 mg, and statin Rx. . Volume will need to be adjusted at HD. F/U Dr Migdalia Dk Demonta Wombles PA-C 11/22/2018 2:36 PM

## 2018-11-23 ENCOUNTER — Ambulatory Visit: Payer: Medicare Other | Admitting: Cardiology

## 2018-11-25 DIAGNOSIS — F4321 Adjustment disorder with depressed mood: Secondary | ICD-10-CM | POA: Diagnosis not present

## 2018-12-03 DIAGNOSIS — N186 End stage renal disease: Secondary | ICD-10-CM | POA: Diagnosis not present

## 2018-12-03 DIAGNOSIS — I639 Cerebral infarction, unspecified: Secondary | ICD-10-CM | POA: Diagnosis not present

## 2018-12-03 DIAGNOSIS — R41841 Cognitive communication deficit: Secondary | ICD-10-CM | POA: Diagnosis not present

## 2018-12-03 DIAGNOSIS — D631 Anemia in chronic kidney disease: Secondary | ICD-10-CM | POA: Diagnosis not present

## 2018-12-05 DIAGNOSIS — R41841 Cognitive communication deficit: Secondary | ICD-10-CM | POA: Diagnosis not present

## 2018-12-05 DIAGNOSIS — D631 Anemia in chronic kidney disease: Secondary | ICD-10-CM | POA: Diagnosis not present

## 2018-12-05 DIAGNOSIS — I639 Cerebral infarction, unspecified: Secondary | ICD-10-CM | POA: Diagnosis not present

## 2018-12-05 DIAGNOSIS — N186 End stage renal disease: Secondary | ICD-10-CM | POA: Diagnosis not present

## 2018-12-06 DIAGNOSIS — D631 Anemia in chronic kidney disease: Secondary | ICD-10-CM | POA: Diagnosis not present

## 2018-12-06 DIAGNOSIS — R41841 Cognitive communication deficit: Secondary | ICD-10-CM | POA: Diagnosis not present

## 2018-12-06 DIAGNOSIS — N186 End stage renal disease: Secondary | ICD-10-CM | POA: Diagnosis not present

## 2018-12-06 DIAGNOSIS — I639 Cerebral infarction, unspecified: Secondary | ICD-10-CM | POA: Diagnosis not present

## 2018-12-07 DIAGNOSIS — D631 Anemia in chronic kidney disease: Secondary | ICD-10-CM | POA: Diagnosis not present

## 2018-12-07 DIAGNOSIS — R41841 Cognitive communication deficit: Secondary | ICD-10-CM | POA: Diagnosis not present

## 2018-12-07 DIAGNOSIS — N186 End stage renal disease: Secondary | ICD-10-CM | POA: Diagnosis not present

## 2018-12-07 DIAGNOSIS — I639 Cerebral infarction, unspecified: Secondary | ICD-10-CM | POA: Diagnosis not present

## 2018-12-07 DIAGNOSIS — I10 Essential (primary) hypertension: Secondary | ICD-10-CM | POA: Diagnosis not present

## 2018-12-07 DIAGNOSIS — M79672 Pain in left foot: Secondary | ICD-10-CM | POA: Diagnosis not present

## 2018-12-07 DIAGNOSIS — R630 Anorexia: Secondary | ICD-10-CM | POA: Diagnosis not present

## 2018-12-09 DIAGNOSIS — I639 Cerebral infarction, unspecified: Secondary | ICD-10-CM | POA: Diagnosis not present

## 2018-12-09 DIAGNOSIS — R41841 Cognitive communication deficit: Secondary | ICD-10-CM | POA: Diagnosis not present

## 2018-12-09 DIAGNOSIS — D631 Anemia in chronic kidney disease: Secondary | ICD-10-CM | POA: Diagnosis not present

## 2018-12-09 DIAGNOSIS — N186 End stage renal disease: Secondary | ICD-10-CM | POA: Diagnosis not present

## 2018-12-13 DIAGNOSIS — N186 End stage renal disease: Secondary | ICD-10-CM | POA: Diagnosis not present

## 2018-12-13 DIAGNOSIS — I639 Cerebral infarction, unspecified: Secondary | ICD-10-CM | POA: Diagnosis not present

## 2018-12-13 DIAGNOSIS — D631 Anemia in chronic kidney disease: Secondary | ICD-10-CM | POA: Diagnosis not present

## 2018-12-13 DIAGNOSIS — R41841 Cognitive communication deficit: Secondary | ICD-10-CM | POA: Diagnosis not present

## 2018-12-14 DIAGNOSIS — R41841 Cognitive communication deficit: Secondary | ICD-10-CM | POA: Diagnosis not present

## 2018-12-14 DIAGNOSIS — F4321 Adjustment disorder with depressed mood: Secondary | ICD-10-CM | POA: Diagnosis not present

## 2018-12-14 DIAGNOSIS — N186 End stage renal disease: Secondary | ICD-10-CM | POA: Diagnosis not present

## 2018-12-14 DIAGNOSIS — I639 Cerebral infarction, unspecified: Secondary | ICD-10-CM | POA: Diagnosis not present

## 2018-12-14 DIAGNOSIS — D631 Anemia in chronic kidney disease: Secondary | ICD-10-CM | POA: Diagnosis not present

## 2018-12-15 DIAGNOSIS — N186 End stage renal disease: Secondary | ICD-10-CM | POA: Diagnosis not present

## 2018-12-15 DIAGNOSIS — I639 Cerebral infarction, unspecified: Secondary | ICD-10-CM | POA: Diagnosis not present

## 2018-12-15 DIAGNOSIS — D631 Anemia in chronic kidney disease: Secondary | ICD-10-CM | POA: Diagnosis not present

## 2018-12-15 DIAGNOSIS — R41841 Cognitive communication deficit: Secondary | ICD-10-CM | POA: Diagnosis not present

## 2018-12-16 DIAGNOSIS — M79672 Pain in left foot: Secondary | ICD-10-CM | POA: Diagnosis not present

## 2018-12-16 DIAGNOSIS — R41841 Cognitive communication deficit: Secondary | ICD-10-CM | POA: Diagnosis not present

## 2018-12-16 DIAGNOSIS — D631 Anemia in chronic kidney disease: Secondary | ICD-10-CM | POA: Diagnosis not present

## 2018-12-16 DIAGNOSIS — I639 Cerebral infarction, unspecified: Secondary | ICD-10-CM | POA: Diagnosis not present

## 2018-12-16 DIAGNOSIS — N186 End stage renal disease: Secondary | ICD-10-CM | POA: Diagnosis not present

## 2018-12-16 DIAGNOSIS — R630 Anorexia: Secondary | ICD-10-CM | POA: Diagnosis not present

## 2018-12-17 DIAGNOSIS — R41841 Cognitive communication deficit: Secondary | ICD-10-CM | POA: Diagnosis not present

## 2018-12-17 DIAGNOSIS — D631 Anemia in chronic kidney disease: Secondary | ICD-10-CM | POA: Diagnosis not present

## 2018-12-17 DIAGNOSIS — N186 End stage renal disease: Secondary | ICD-10-CM | POA: Diagnosis not present

## 2018-12-17 DIAGNOSIS — I639 Cerebral infarction, unspecified: Secondary | ICD-10-CM | POA: Diagnosis not present

## 2018-12-20 DIAGNOSIS — R41841 Cognitive communication deficit: Secondary | ICD-10-CM | POA: Diagnosis not present

## 2018-12-20 DIAGNOSIS — N186 End stage renal disease: Secondary | ICD-10-CM | POA: Diagnosis not present

## 2018-12-20 DIAGNOSIS — D631 Anemia in chronic kidney disease: Secondary | ICD-10-CM | POA: Diagnosis not present

## 2018-12-20 DIAGNOSIS — I639 Cerebral infarction, unspecified: Secondary | ICD-10-CM | POA: Diagnosis not present

## 2018-12-21 DIAGNOSIS — F4321 Adjustment disorder with depressed mood: Secondary | ICD-10-CM | POA: Diagnosis not present

## 2018-12-21 DIAGNOSIS — D631 Anemia in chronic kidney disease: Secondary | ICD-10-CM | POA: Diagnosis not present

## 2018-12-21 DIAGNOSIS — R41841 Cognitive communication deficit: Secondary | ICD-10-CM | POA: Diagnosis not present

## 2018-12-21 DIAGNOSIS — N186 End stage renal disease: Secondary | ICD-10-CM | POA: Diagnosis not present

## 2018-12-21 DIAGNOSIS — I639 Cerebral infarction, unspecified: Secondary | ICD-10-CM | POA: Diagnosis not present

## 2018-12-22 DIAGNOSIS — N186 End stage renal disease: Secondary | ICD-10-CM | POA: Diagnosis not present

## 2018-12-22 DIAGNOSIS — R41841 Cognitive communication deficit: Secondary | ICD-10-CM | POA: Diagnosis not present

## 2018-12-22 DIAGNOSIS — D631 Anemia in chronic kidney disease: Secondary | ICD-10-CM | POA: Diagnosis not present

## 2018-12-22 DIAGNOSIS — I639 Cerebral infarction, unspecified: Secondary | ICD-10-CM | POA: Diagnosis not present

## 2018-12-23 DIAGNOSIS — D631 Anemia in chronic kidney disease: Secondary | ICD-10-CM | POA: Diagnosis not present

## 2018-12-23 DIAGNOSIS — N186 End stage renal disease: Secondary | ICD-10-CM | POA: Diagnosis not present

## 2018-12-23 DIAGNOSIS — I639 Cerebral infarction, unspecified: Secondary | ICD-10-CM | POA: Diagnosis not present

## 2018-12-23 DIAGNOSIS — R41841 Cognitive communication deficit: Secondary | ICD-10-CM | POA: Diagnosis not present

## 2018-12-27 DIAGNOSIS — D631 Anemia in chronic kidney disease: Secondary | ICD-10-CM | POA: Diagnosis not present

## 2018-12-27 DIAGNOSIS — N186 End stage renal disease: Secondary | ICD-10-CM | POA: Diagnosis not present

## 2018-12-27 DIAGNOSIS — R41841 Cognitive communication deficit: Secondary | ICD-10-CM | POA: Diagnosis not present

## 2018-12-27 DIAGNOSIS — I639 Cerebral infarction, unspecified: Secondary | ICD-10-CM | POA: Diagnosis not present

## 2018-12-28 ENCOUNTER — Encounter: Payer: Self-pay | Admitting: *Deleted

## 2018-12-28 ENCOUNTER — Other Ambulatory Visit: Payer: Self-pay | Admitting: *Deleted

## 2018-12-28 ENCOUNTER — Encounter: Payer: Self-pay | Admitting: Vascular Surgery

## 2018-12-28 ENCOUNTER — Ambulatory Visit (INDEPENDENT_AMBULATORY_CARE_PROVIDER_SITE_OTHER): Payer: Medicare Other | Admitting: Vascular Surgery

## 2018-12-28 ENCOUNTER — Other Ambulatory Visit: Payer: Self-pay

## 2018-12-28 VITALS — BP 90/54 | HR 64 | Temp 97.0°F | Resp 16 | Ht 70.0 in | Wt 124.0 lb

## 2018-12-28 DIAGNOSIS — Z992 Dependence on renal dialysis: Secondary | ICD-10-CM

## 2018-12-28 DIAGNOSIS — D631 Anemia in chronic kidney disease: Secondary | ICD-10-CM | POA: Diagnosis not present

## 2018-12-28 DIAGNOSIS — N186 End stage renal disease: Secondary | ICD-10-CM | POA: Diagnosis not present

## 2018-12-28 DIAGNOSIS — R41841 Cognitive communication deficit: Secondary | ICD-10-CM | POA: Diagnosis not present

## 2018-12-28 DIAGNOSIS — I639 Cerebral infarction, unspecified: Secondary | ICD-10-CM | POA: Diagnosis not present

## 2018-12-28 NOTE — Progress Notes (Signed)
Phone call and pre-op letter faxed to [facility] @ [facility]. Hold Plavix starting today all other instructions per letter.

## 2018-12-28 NOTE — Progress Notes (Signed)
Vascular and Vein Specialist of [facility]  Patient name: Trevor [name] MRN: [id number] DOB: 09-04-1946 Sex: male  REASON FOR VISIT: Evaluate left upper arm AV fistula  HPI: Trevor [name] is a 73 y.o. male here today for evaluation of his left upper arm AV fistula.  He was referred for concern regarding an eschar in the distal portion of the fistula.  He had had a similar area in the upper portion of the fistula requiring surgical treatment and plication with Dr. Donzetta Matters on 05/20/2018.  Past Medical History:  Diagnosis Date  . AAA (abdominal aortic aneurysm) ([place])   . Anemia   . Anxiety   . Arthritis    arms and back  . Blindness   . CHF (congestive heart failure) ([place])   . Coronary artery disease    mild   . 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 ESRD on hemodialysis ([place])    started HD 2014-15  . Headache(784.0)   . HTN (hypertension)   . Protein-calorie malnutrition ([place])   . STEMI (ST elevation myocardial infarction) ([place]) 11/11/2018  . Stroke [facility]) 01/2018   no residual deficits    Family History  Problem Relation Age of Onset  . Heart attack Mother        MI in her 41s  . Diabetes Mother   . Alcohol abuse Father   . Anesthesia problems Neg Hx   . Hypotension Neg Hx   . Malignant hyperthermia Neg Hx   . Pseudochol deficiency Neg Hx     SOCIAL HISTORY: Social History   Tobacco Use  . Smoking status: Current Every Day Smoker    Packs/day: 0.50    Years: 60.00    Pack years: 30.00    Types: Cigarettes  . Smokeless tobacco: Never Used  . Tobacco comment: 5-6 cigarettes per day  Substance Use Topics  . Alcohol use: No    Alcohol/week: 0.0 standard drinks    Comment: Occasional    No Known Allergies  Current Outpatient Medications  Medication Sig Dispense Refill  . aspirin EC 81 MG tablet Take 1 tablet (81 mg total) by mouth daily. 90 tablet 3  . atorvastatin (LIPITOR)  80 MG tablet Take 1 tablet (80 mg total) by mouth at bedtime. 30 tablet 6  . bisacodyl (DULCOLAX) 10 MG suppository Place 10 mg rectally daily as needed (constipation).     . cinacalcet (SENSIPAR) 90 MG tablet Take 90 mg by mouth daily with supper.    . clopidogrel (PLAVIX) 75 MG tablet Take 1 tablet (75 mg total) by mouth daily. 30 tablet 2  . diclofenac sodium (VOLTAREN) 1 % GEL Apply topically 4 (four) times daily.    Marland Kitchen docusate sodium (COLACE) 100 MG capsule Take 200 mg by mouth at bedtime.     Marland Kitchen doxercalciferol (HECTOROL) 4 MCG/2ML injection Inject 1 mL (2 mcg total) into the vein every Monday, Wednesday, and Friday with hemodialysis. 2 mL   . gabapentin (NEURONTIN) 300 MG capsule Take 1 capsule (300 mg total) by mouth at bedtime. 30 capsule 0  . Glycopyrrolate (LONHALA MAGNAIR REFILL KIT) 25 MCG/ML SOLN Inhale 25 mcg into the lungs 2 (two) times daily.    Marland Kitchen HYDROcodone-acetaminophen (NORCO) 5-325 MG tablet Take 1 tablet by mouth every 6 (six) hours as needed for moderate pain. 12 tablet 0  . Icosapent Ethyl (VASCEPA) 1 g CAPS Take 1 g by mouth 2 (two)  times daily.     Marland Kitchen ipratropium-albuterol (DUONEB) 0.5-2.5 (3) MG/3ML SOLN Take 3 mLs by nebulization every 8 (eight) hours as needed (for copd).    . isosorbide mononitrate (IMDUR) 30 MG 24 hr tablet Take 1 tablet (30 mg total) by mouth daily. 90 tablet 3  . metoprolol tartrate (LOPRESSOR) 25 MG tablet Take 0.5 tablets (12.5 mg total) by mouth 2 (two) times daily. 30 tablet 5  . mirtazapine (REMERON) 7.5 MG tablet Take 7.5 mg by mouth at bedtime.    . nitroGLYCERIN (NITROSTAT) 0.4 MG SL tablet Place 1 tablet (0.4 mg total) under the tongue every 5 (five) minutes x 3 doses as needed for chest pain. 25 tablet 12  . Nutritional Supplements (ENSURE PO) Take 240 mLs by mouth daily.    . Nutritional Supplements (FEEDING SUPPLEMENT, NEPRO CARB STEADY,) LIQD Take 237 mLs by mouth 2 (two) times daily between meals.  0  . ondansetron (ZOFRAN) 4 MG tablet  Take 4 mg by mouth every 6 (six) hours as needed for nausea or vomiting.    . senna (SENOKOT) 8.6 MG TABS tablet Take 1 tablet by mouth daily.    . ferric citrate (AURYXIA) 1 GM 210 MG(Fe) tablet Take 630 mg by mouth 3 (three) times daily with meals.     . sevelamer carbonate (RENVELA) 800 MG tablet      No current facility-administered medications for this visit.     REVIEW OF SYSTEMS:  [X]  denotes positive finding, [ ]  denotes negative finding Cardiac  Comments:  Chest pain or chest pressure:    Shortness of breath upon exertion:    Short of breath when lying flat:    Irregular heart rhythm:        Vascular    Pain in calf, thigh, or hip brought on by ambulation:    Pain in feet at night that wakes you up from your sleep:  x   Blood clot in your veins:    Leg swelling:           PHYSICAL EXAM: Vitals:   12/28/18 1028  BP: (!) 90/54  Pulse: 64  Resp: 16  Temp: (!) 97 F (36.1 C)  TempSrc: Oral  SpO2: 99%  Weight: 124 lb (56.2 kg)  Height: 5' 10"  (1.778 m)    GENERAL: The patient is a well-nourished male, in no acute distress. The vital signs are documented above. CARDIOVASCULAR: Area of the fistula shows eschar which is adherent to the vein in the lower portion of the fistula.  No active bleeding. PULMONARY: There is good air exchange  MUSCULOSKELETAL: There are no major deformities or cyanosis. NEUROLOGIC: No focal weakness or paresthesias are detected. SKIN: There are no ulcers or rashes noted. PSYCHIATRIC: The patient has a normal affect.  DATA:  None  MEDICAL ISSUES: Vertell Limber regarding the eschar that does appear to extend down to the vein.  Have recommended surgical revision to reduce risk of serious bleeding.  We will schedule this in 2 days on his next nondialysis day.  He also reports pain in both feet.  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  Has no skin breakdown or ulceration over either foot.  We will continue to monitor.  He is extremely  debilitated and is in a wheelchair.  He reports he is able to stand and transfer from bed to chair    Rosetta Posner, MD [facility] Vascular and Vein Specialists of [facility] Tel [phone] Pager [phone]

## 2018-12-28 NOTE — H&P (View-Only) (Signed)
Vascular and Vein Specialist of [facility]  Patient name: Trevor [name] MRN: [id number] DOB: 02/26/1946 Sex: male  REASON FOR VISIT: Evaluate left upper arm AV fistula  HPI: Trevor [name] is a 73 y.o. male here today for evaluation of his left upper arm AV fistula.  He was referred for concern regarding an eschar in the distal portion of the fistula.  He had had a similar area in the upper portion of the fistula requiring surgical treatment and plication with Dr. Donzetta Matters on 05/20/2018.  Past Medical History:  Diagnosis Date  . AAA (abdominal aortic aneurysm) ([place])   . Anemia   . Anxiety   . Arthritis    arms and back  . Blindness   . CHF (congestive heart failure) ([place])   . Coronary artery disease    mild   . 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 ESRD on hemodialysis ([place])    started HD 2014-15  . Headache(784.0)   . HTN (hypertension)   . Protein-calorie malnutrition ([place])   . STEMI (ST elevation myocardial infarction) ([place]) 11/11/2018  . Stroke [facility]) 01/2018   no residual deficits    Family History  Problem Relation Age of Onset  . Heart attack Mother        MI in her 3s  . Diabetes Mother   . Alcohol abuse Father   . Anesthesia problems Neg Hx   . Hypotension Neg Hx   . Malignant hyperthermia Neg Hx   . Pseudochol deficiency Neg Hx     SOCIAL HISTORY: Social History   Tobacco Use  . Smoking status: Current Every Day Smoker    Packs/day: 0.50    Years: 60.00    Pack years: 30.00    Types: Cigarettes  . Smokeless tobacco: Never Used  . Tobacco comment: 5-6 cigarettes per day  Substance Use Topics  . Alcohol use: No    Alcohol/week: 0.0 standard drinks    Comment: Occasional    No Known Allergies  Current Outpatient Medications  Medication Sig Dispense Refill  . aspirin EC 81 MG tablet Take 1 tablet (81 mg total) by mouth daily. 90 tablet 3  . atorvastatin (LIPITOR)  80 MG tablet Take 1 tablet (80 mg total) by mouth at bedtime. 30 tablet 6  . bisacodyl (DULCOLAX) 10 MG suppository Place 10 mg rectally daily as needed (constipation).     . cinacalcet (SENSIPAR) 90 MG tablet Take 90 mg by mouth daily with supper.    . clopidogrel (PLAVIX) 75 MG tablet Take 1 tablet (75 mg total) by mouth daily. 30 tablet 2  . diclofenac sodium (VOLTAREN) 1 % GEL Apply topically 4 (four) times daily.    Marland Kitchen docusate sodium (COLACE) 100 MG capsule Take 200 mg by mouth at bedtime.     Marland Kitchen doxercalciferol (HECTOROL) 4 MCG/2ML injection Inject 1 mL (2 mcg total) into the vein every Monday, Wednesday, and Friday with hemodialysis. 2 mL   . gabapentin (NEURONTIN) 300 MG capsule Take 1 capsule (300 mg total) by mouth at bedtime. 30 capsule 0  . Glycopyrrolate (LONHALA MAGNAIR REFILL KIT) 25 MCG/ML SOLN Inhale 25 mcg into the lungs 2 (two) times daily.    Marland Kitchen HYDROcodone-acetaminophen (NORCO) 5-325 MG tablet Take 1 tablet by mouth every 6 (six) hours as needed for moderate pain. 12 tablet 0  . Icosapent Ethyl (VASCEPA) 1 g CAPS Take 1 g by mouth 2 (two)  times daily.     Marland Kitchen ipratropium-albuterol (DUONEB) 0.5-2.5 (3) MG/3ML SOLN Take 3 mLs by nebulization every 8 (eight) hours as needed (for copd).    . isosorbide mononitrate (IMDUR) 30 MG 24 hr tablet Take 1 tablet (30 mg total) by mouth daily. 90 tablet 3  . metoprolol tartrate (LOPRESSOR) 25 MG tablet Take 0.5 tablets (12.5 mg total) by mouth 2 (two) times daily. 30 tablet 5  . mirtazapine (REMERON) 7.5 MG tablet Take 7.5 mg by mouth at bedtime.    . nitroGLYCERIN (NITROSTAT) 0.4 MG SL tablet Place 1 tablet (0.4 mg total) under the tongue every 5 (five) minutes x 3 doses as needed for chest pain. 25 tablet 12  . Nutritional Supplements (ENSURE PO) Take 240 mLs by mouth daily.    . Nutritional Supplements (FEEDING SUPPLEMENT, NEPRO CARB STEADY,) LIQD Take 237 mLs by mouth 2 (two) times daily between meals.  0  . ondansetron (ZOFRAN) 4 MG tablet  Take 4 mg by mouth every 6 (six) hours as needed for nausea or vomiting.    . senna (SENOKOT) 8.6 MG TABS tablet Take 1 tablet by mouth daily.    . ferric citrate (AURYXIA) 1 GM 210 MG(Fe) tablet Take 630 mg by mouth 3 (three) times daily with meals.     . sevelamer carbonate (RENVELA) 800 MG tablet      No current facility-administered medications for this visit.     REVIEW OF SYSTEMS:  [X]  denotes positive finding, [ ]  denotes negative finding Cardiac  Comments:  Chest pain or chest pressure:    Shortness of breath upon exertion:    Short of breath when lying flat:    Irregular heart rhythm:        Vascular    Pain in calf, thigh, or hip brought on by ambulation:    Pain in feet at night that wakes you up from your sleep:  x   Blood clot in your veins:    Leg swelling:           PHYSICAL EXAM: Vitals:   12/28/18 1028  BP: (!) 90/54  Pulse: 64  Resp: 16  Temp: (!) 97 F (36.1 C)  TempSrc: Oral  SpO2: 99%  Weight: 124 lb (56.2 kg)  Height: 5' 10"  (1.778 m)    GENERAL: The patient is a well-nourished male, in no acute distress. The vital signs are documented above. CARDIOVASCULAR: Area of the fistula shows eschar which is adherent to the vein in the lower portion of the fistula.  No active bleeding. PULMONARY: There is good air exchange  MUSCULOSKELETAL: There are no major deformities or cyanosis. NEUROLOGIC: No focal weakness or paresthesias are detected. SKIN: There are no ulcers or rashes noted. PSYCHIATRIC: The patient has a normal affect.  DATA:  None  MEDICAL ISSUES: Vertell Limber regarding the eschar that does appear to extend down to the vein.  Have recommended surgical revision to reduce risk of serious bleeding.  We will schedule this in 2 days on his next nondialysis day.  He also reports pain in both feet.  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  Has no skin breakdown or ulceration over either foot.  We will continue to monitor.  He is extremely  debilitated and is in a wheelchair.  He reports he is able to stand and transfer from bed to chair    Rosetta Posner, MD [facility] Vascular and Vein Specialists of [facility] Tel [phone] Pager [phone]

## 2018-12-29 ENCOUNTER — Encounter (HOSPITAL_COMMUNITY): Payer: Self-pay | Admitting: *Deleted

## 2018-12-29 DIAGNOSIS — R41841 Cognitive communication deficit: Secondary | ICD-10-CM | POA: Diagnosis not present

## 2018-12-29 DIAGNOSIS — I639 Cerebral infarction, unspecified: Secondary | ICD-10-CM | POA: Diagnosis not present

## 2018-12-29 DIAGNOSIS — N186 End stage renal disease: Secondary | ICD-10-CM | POA: Diagnosis not present

## 2018-12-29 DIAGNOSIS — D631 Anemia in chronic kidney disease: Secondary | ICD-10-CM | POA: Diagnosis not present

## 2018-12-29 NOTE — Anesthesia Preprocedure Evaluation (Addendum)
Anesthesia Evaluation  Patient identified by MRN, date of birth, ID band Patient awake    Reviewed: Allergy & Precautions, H&P , NPO status , Patient's Chart, lab work & pertinent test results, reviewed documented beta blocker date and time   Airway Mallampati: II  TM Distance: >3 FB Neck ROM: Full    Dental no notable dental hx. (+) Upper Dentures, Lower Dentures, Dental Advisory Given   Pulmonary Current Smoker,    Pulmonary exam normal breath sounds clear to auscultation       Cardiovascular hypertension, Pt. on medications and Pt. on home beta blockers + CAD, + Past MI, + Peripheral Vascular Disease and +CHF   Rhythm:Regular Rate:Normal     Neuro/Psych  Headaches, Anxiety CVA negative psych ROS   GI/Hepatic negative GI ROS, Neg liver ROS,   Endo/Other  negative endocrine ROS  Renal/GU ESRF and DialysisRenal disease  negative genitourinary   Musculoskeletal  (+) Arthritis , Osteoarthritis,    Abdominal   Peds  Hematology  (+) Blood dyscrasia, anemia ,   Anesthesia Other Findings   Reproductive/Obstetrics negative OB ROS                           Anesthesia Physical Anesthesia Plan  ASA: III  Anesthesia Plan: MAC   Post-op Pain Management:    Induction: Intravenous  PONV Risk Score and Plan: 1 and Propofol infusion and Ondansetron  Airway Management Planned: Simple Face Mask  Additional Equipment:   Intra-op Plan:   Post-operative Plan:   Informed Consent: I have reviewed the patients History and Physical, chart, labs and discussed the procedure including the risks, benefits and alternatives for the proposed anesthesia with the patient or authorized representative who has indicated his/her understanding and acceptance.     Dental advisory given  Plan Discussed with: CRNA  Anesthesia Plan Comments: (See PAT note written 12/29/2018 by Myra Gianotti, PA-C. )        Anesthesia Quick Evaluation

## 2018-12-29 NOTE — Progress Notes (Signed)
Pt a resident of [name], [phone].  Spoke with [place] to review medical history.  States pt is A&O and able to answer questions himself. Confirmed pt's Plavix was held 1/28 per Dr. Donnetta Hutching.  Instructions printed, sent via fax to [phone].  Requested medication list with last dose visible, medical history, and any recent cardiac testing to be sent DOS.

## 2018-12-29 NOTE — Progress Notes (Signed)
Anesthesia Chart Review: Trevor [name]   Case:  [id number] Date/Time:  12/30/18 1023   Procedure:  REVISION OF ARTERIOVENOUS FISTULA LEFT ARM (Left )   Anesthesia type:  Monitor Anesthesia Care   Pre-op diagnosis:  COMPLICATION OF FISTUA LEFT ARM   Location:  [facility] OR ROOM 23 / [place] [facility]   Surgeon:  Rosetta Posner, MD      DISCUSSION: Patient is a 73 year old male scheduled for the above procedure. He was seen by Dr. Donnetta Hutching on 12/28/18 for evaluation of LUE AVF. He had developed an eschar that extend down the vein and surgical revision recommended to reduced risk of serious bleeding. He had similar area on the upper portion of his AVF and underwent revision and plication 0/26/37.   Since then he was admitted with chest pain, rule in NSTEMI (troponin 10.59) 11/11/18, but unable to obtain vascular access for LHC, so treated medically (Plavix, Lopressor, Lipitor, Imdur)--although medical therapy somewhat limited by hemodialysis and labile blood pressures. EF stable at 45-50%. Nephrology consulted for HD and BP management. Discharged back to [facility] 11/15/18.  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 He is a resident of [place].  Last cardiology visit was on 11/22/18 with [name], PA-C. Patient was without chest pain, but was hypotensive (SBP 70's). Patient declined evaluation in ED for consideration of IV hydration. Imdur dose was decreased. 3 month follow-up recommended.  Plavix held 12/28/18.   Patient with multiple comorbidities as outlined above. He had an MI in December with medical therapy recommended after failed attempt at [facility]. Required medication adjustment  for hypotension on 11/22/18, otherwise no acute symptoms. He undergoes HD MWF. If no acute changes and labs acceptable then I would anticipate he can proceed given nature of procedure and potential bleeding risks if not done. Reviewed above with anesthesiologist Lillia Abed, MD. Anesthesiologist to evaluate on the day of surgery.   PROVIDERS: Clinic, Tamsen Meek, MD is nephrologist Kirk Ruths, MD is primary cardiologist   LABS: He is for labs on the day of surgery.    IMAGES: 1V PCXR 11/11/18: FINDINGS: Cardiac shadow is at the upper limits of normal but accentuated by the portable technique. Diffuse aortic calcifications are again identified. The lungs are well aerated bilaterally. Previously seen infiltrative changes have resolved. No sizable effusion is seen. No bony abnormality is noted. IMPRESSION: No acute abnormality seen.  MRI Brain/MRA head/neck 12/29/15: IMPRESSION: - MRI head: No acute insult. 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 Extensive chronic small vessel disease. - MRA neck: No significant carotid bifurcation disease. Antegrade flow in both vertebral arteries. - MRA head: No major vessel occlusion at this time. Extensive atherosclerotic narrowing and irregularity in both carotid siphon regions. Intracranial atherosclerotic disease diffusely affecting the more distal branch vessels as well as the A1 segment on the right. There is considerable atherosclerotic disease of the distal left vertebral artery with serial 50% stenoses.   EKG: 11/22/18: NSR, LVH with repolarization abnormality. T wave abnormality is most notable in lateral leads and to a lesser extent inferior leads.    CV: Echo 11/12/18: Study Conclusions - Left ventricle: The cavity size was normal. Wall thickness was  increased in a pattern of moderate LVH. There was moderate   concentric hypertrophy. Systolic function was mildly reduced. The    estimated ejection fraction was in the range of 45% to 50%.   Diffuse hypokinesis. Doppler parameters are consistent with   abnormal left ventricular relaxation (grade 1 diastolic   dysfunction). - Mitral valve: Calcified annulus. There was no significant   regurgitation. - Left atrium: The atrium was mildly dilated. - Pericardium, extracardiac: A trivial pericardial effusion was   identified posterior to the heart. Impressions: - Mildly reduced LV EF, confirmed with 2D tracking. Diffuse   hypokinesis, no regional wall motion abnormalities. Trivial   posterior pericardial effusion. Overall similar to prior [from 01/12/18].  Cardiac cath (attempted) 11/11/18: Conclusion: Unsuccessful attempts at cardiac catheterization from the left groin, right groin, and right radial artery.  Patient with severe peripheral arterial disease with previous axillary bifemoral grafting and severe diffuse atherosclerosis with left arm hemodialysis access. Recommendation: Medical therapy.  Discussed with patient's daughter. Burt Knack, Legrand Como, MD)  Cardiac cath 11/01/16: Conclusion:  Prox Cx to Mid Cx lesion, 60 %stenosed.  1st Diag lesion, 60 %stenosed. IMPRESSION: Trevor [name] has noncritical CAD. His anatomy is unchanged from his prior cath in 2015. I believe his chest pain was primarily related to hypertension which improved with sublingual nitroglycerin. He still remains mildly hypertensive in the cath lab on his LVEDP was only 16. Medical therapy including dialysis and more aggressive blood pressure control will be pursued. The ACT was measured at 202.. The sheath will be removed once ACT falls below 170 and pressure held. Gwenlyn Found, Roderic Palau, MD)  48 Hour Holter monitor 06/13/14: SR with PVCs, PACs. 12 beats of NSVT.   Past Medical History:  Diagnosis Date  . AAA (abdominal aortic aneurysm) ([place])   . Anemia   . Anxiety   . Arthritis    arms and back  . Blindness   . CHF (congestive heart failure) ([place])    . Coronary artery disease    mild   . 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 ESRD on hemodialysis ([place])    started HD 2014-15  . Headache(784.0)   . HTN (hypertension)   . Protein-calorie malnutrition ([place])   . STEMI (ST elevation myocardial infarction) ([place]) 11/11/2018  . Stroke [facility]) 01/2018   no residual deficits    Past Surgical History:  Procedure Laterality Date  . A/V FISTULAGRAM N/A 07/29/2018   Procedure: A/V FISTULAGRAM - left upper extremity;  Surgeon: Waynetta Sandy, MD;  Location: [place] CV [facility];  Service: Cardiovascular;  Laterality: N/A;  . AXILLARY-FEMORAL BYPASS GRAFT  06/28/2017   Procedure: BYPASS GRAFT RIGHT AXILLA-BIFEMORAL USING 8MM X 30CM AND 8MM X 60CM HEMASHIELD GOLD GRAFTS;  Surgeon: Rosetta Posner, MD;  Location: [place];  Service: Vascular;;  . CARDIAC CATHETERIZATION N/A 11/01/2016   Procedure: Left Heart Cath and Coronary Angiography;  Surgeon: Lorretta Harp, MD;  Location: [place] CV LAB;  Service: Cardiovascular;  Laterality: N/A;  . ENDARTERECTOMY FEMORAL Bilateral 06/28/2017   Procedure: ENDARTERECTOMY BILATERAL FEMORAL ARTERIES;  Surgeon: Rosetta Posner, MD;  Location: [place];  Service: Vascular;  Laterality: Bilateral;  . FISTULA SUPERFICIALIZATION Left 6/31/4970   Procedure: PLICATION OF ARTERIOVENOUS FISTULA LEFT ARM;  Surgeon: Waynetta Sandy, MD;  Location: [place];  Service: Vascular;  Laterality: Left;  . FISTULOGRAM Left 05/20/2018   Procedure: FISTULOGRAM WITH BALLOON ANGIOPLASTY LEFT ARM ARTERIOVENOUS FISTULA;  Surgeon: Waynetta Sandy, MD;  Location: [place];  Service: Vascular;  Laterality: Left;  . INSERTION OF DIALYSIS CATHETER  09/21/2012   Procedure: INSERTION OF DIALYSIS CATHETER;  Surgeon: Angelia Mould, MD;  Location: [place];  Service: Vascular;  Laterality: N/A;  Right Internal Jugular Placement  . LEFT HEART CATH N/A 11/11/2018   Procedure: LEFT  HEART CATH;  Surgeon: Sherren Mocha, MD;  Location: [place] CV LAB;  Service: Cardiovascular;  Laterality: N/A;  . LEFT HEART CATHETERIZATION WITH CORONARY ANGIOGRAM N/A 09/28/2012   Procedure: LEFT HEART CATHETERIZATION WITH CORONARY ANGIOGRAM;  Surgeon: Sherren Mocha, MD;  Location: [facility] CATH LAB;  Service: Cardiovascular;  Laterality: N/A;  . LEFT HEART CATHETERIZATION WITH CORONARY ANGIOGRAM N/A 07/07/2014   Procedure: LEFT HEART CATHETERIZATION WITH CORONARY ANGIOGRAM;  Surgeon: Leonie Man, MD;  Location: [facility] CATH LAB;  Service: Cardiovascular;  Laterality: N/A;  . PERIPHERAL VASCULAR BALLOON ANGIOPLASTY Left 07/29/2018   Procedure: PERIPHERAL VASCULAR BALLOON ANGIOPLASTY;  Surgeon: Waynetta Sandy, MD;  Location: [place] CV LAB;  Service: Cardiovascular;  Laterality: Left;  CENTRAL VEIN  . PERIPHERAL VASCULAR INTERVENTION Left 07/29/2018   Procedure: PERIPHERAL VASCULAR INTERVENTION;  Surgeon: Waynetta Sandy, MD;  Location: [place] CV LAB;  Service: Cardiovascular;  Laterality: Left;  AXILLARY VEIN  . REVISON OF ARTERIOVENOUS FISTULA Left 08/17/2018   Procedure: REVISION OF ARTERIOVENOUS FISTULA LEFT ARM;  Surgeon: Waynetta Sandy, MD;  Location: [place];  Service: Vascular;  Laterality: Left;  . right hand    . TEE WITHOUT CARDIOVERSION  10/09/2011   Procedure: TRANSESOPHAGEAL ECHOCARDIOGRAM (TEE);  Surgeon: Lelon Perla, MD;  Location: [facility] ENDOSCOPY;  Service: Cardiovascular;  Laterality: N/A;    MEDICATIONS: No current facility-administered medications for this encounter.    Marland Kitchen aspirin EC 81 MG tablet  . atorvastatin (LIPITOR) 80 MG tablet  . bisacodyl (DULCOLAX) 10 MG suppository  . Cholecalciferol (D3-1000) 25 MCG (1000 UT) tablet  . cinacalcet (SENSIPAR) 90 MG tablet  . clopidogrel (PLAVIX) 75 MG tablet  . diclofenac sodium (VOLTAREN) 1 % GEL  . docusate sodium (COLACE) 100 MG capsule  . gabapentin (NEURONTIN) 300 MG capsule  .  Glycopyrrolate (LONHALA MAGNAIR REFILL KIT) 25 MCG/ML SOLN  . Icosapent Ethyl (VASCEPA) 1 g CAPS  . ipratropium-albuterol (DUONEB) 0.5-2.5 (3) MG/3ML SOLN  . isosorbide mononitrate (IMDUR) 30 MG 24 hr tablet  . metoprolol tartrate (LOPRESSOR) 25 MG tablet  . mirtazapine (REMERON) 7.5 MG tablet  . nitroGLYCERIN (NITROSTAT) 0.4 MG SL tablet  . Nutritional Supplements (ENSURE PO)  . Nutritional Supplements (FEEDING SUPPLEMENT, NEPRO CARB STEADY,) LIQD  . ondansetron (ZOFRAN) 4 MG tablet  . senna (SENOKOT) 8.6 MG TABS tablet  . sevelamer carbonate (RENVELA) 800 MG tablet  . doxercalciferol (HECTOROL) 4 MCG/2ML injection  . HYDROcodone-acetaminophen (NORCO) 5-325 MG tablet    Myra Gianotti, PA-C Surgical Short Stay/Anesthesiology [facility] Phone [phone] [facility] Phone [phone] 12/29/2018 4:26 PM

## 2018-12-29 NOTE — Pre-Procedure Instructions (Addendum)
    TILER BRANDIS  12/29/2018     Your procedure is scheduled on January 30,2020.  Report to [facility] Admitting at 08:00 A.M.  Call this number if you have problems the morning of surgery:  [phone]   Remember:  Do not eat or drink after midnight.    Take these medicines the morning of surgery with A SIP OF WATER : Metoprolol (Lopressor) Isosorbide (Imdur) Aspirin  If needed: Ondansetron (Zofran), Duo-neb, Hydrocodone-acetaminophen (Norco)  Per Dr. Donnetta Hutching, HOLD PLAVIX December 28, 2018.   NO SMOKING 24 hours prior to surgery.   Do not wear jewelry  Do not wear lotions, powders, or perfumes, or deodorant.  Do not shave 48 hours prior to surgery.  Men may shave face and neck.  Do not bring valuables to the hospital.  [facility] is not responsible for any belongings or valuables.  Contacts, dentures or bridgework may not be worn into surgery.  Leave your suitcase in the car.  After surgery it may be brought to your room.  For patients admitted to the hospital, discharge time will be determined by your treatment team.  Patients discharged the day of surgery will not be allowed to drive home.   For any questions today (January 29), call [phone].

## 2018-12-30 ENCOUNTER — Ambulatory Visit (HOSPITAL_COMMUNITY)
Admission: RE | Admit: 2018-12-30 | Discharge: 2018-12-30 | Disposition: A | Discharge status: Skilled Nursing Facility | Payer: Medicare Other | Attending: Vascular Surgery | Admitting: Vascular Surgery

## 2018-12-30 ENCOUNTER — Ambulatory Visit (HOSPITAL_COMMUNITY): Payer: Medicare Other | Admitting: Vascular Surgery

## 2018-12-30 ENCOUNTER — Encounter (HOSPITAL_COMMUNITY): Payer: Self-pay

## 2018-12-30 ENCOUNTER — Encounter (HOSPITAL_COMMUNITY)
Admission: RE | Disposition: A | Discharge status: Skilled Nursing Facility | Payer: Self-pay | Source: Home / Self Care | Attending: Vascular Surgery

## 2018-12-30 ENCOUNTER — Other Ambulatory Visit: Payer: Self-pay

## 2018-12-30 DIAGNOSIS — Z7982 Long term (current) use of aspirin: Secondary | ICD-10-CM | POA: Insufficient documentation

## 2018-12-30 DIAGNOSIS — M479 Spondylosis, unspecified: Secondary | ICD-10-CM | POA: Insufficient documentation

## 2018-12-30 DIAGNOSIS — I252 Old myocardial infarction: Secondary | ICD-10-CM | POA: Insufficient documentation

## 2018-12-30 DIAGNOSIS — I509 Heart failure, unspecified: Secondary | ICD-10-CM | POA: Diagnosis not present

## 2018-12-30 DIAGNOSIS — Z992 Dependence on renal dialysis: Secondary | ICD-10-CM | POA: Diagnosis not present

## 2018-12-30 DIAGNOSIS — Y832 Surgical operation with anastomosis, bypass or graft as the cause of abnormal reaction of the patient, or of later complication, without mention of misadventure at the time of the procedure: Secondary | ICD-10-CM | POA: Insufficient documentation

## 2018-12-30 DIAGNOSIS — T827XXA Infection and inflammatory reaction due to other cardiac and vascular devices, implants and grafts, initial encounter: Secondary | ICD-10-CM | POA: Diagnosis not present

## 2018-12-30 DIAGNOSIS — F17210 Nicotine dependence, cigarettes, uncomplicated: Secondary | ICD-10-CM | POA: Insufficient documentation

## 2018-12-30 DIAGNOSIS — Z79899 Other long term (current) drug therapy: Secondary | ICD-10-CM | POA: Insufficient documentation

## 2018-12-30 DIAGNOSIS — L98499 Non-pressure chronic ulcer of skin of other sites with unspecified severity: Secondary | ICD-10-CM | POA: Diagnosis not present

## 2018-12-30 DIAGNOSIS — Z791 Long term (current) use of non-steroidal anti-inflammatories (NSAID): Secondary | ICD-10-CM | POA: Insufficient documentation

## 2018-12-30 DIAGNOSIS — Z8673 Personal history of transient ischemic attack (TIA), and cerebral infarction without residual deficits: Secondary | ICD-10-CM | POA: Diagnosis not present

## 2018-12-30 DIAGNOSIS — T82898A Other specified complication of vascular prosthetic devices, implants and grafts, initial encounter: Secondary | ICD-10-CM | POA: Diagnosis not present

## 2018-12-30 DIAGNOSIS — I739 Peripheral vascular disease, unspecified: Secondary | ICD-10-CM | POA: Diagnosis not present

## 2018-12-30 DIAGNOSIS — I132 Hypertensive heart and chronic kidney disease with heart failure and with stage 5 chronic kidney disease, or end stage renal disease: Secondary | ICD-10-CM | POA: Diagnosis not present

## 2018-12-30 DIAGNOSIS — N186 End stage renal disease: Secondary | ICD-10-CM | POA: Insufficient documentation

## 2018-12-30 DIAGNOSIS — Z7902 Long term (current) use of antithrombotics/antiplatelets: Secondary | ICD-10-CM | POA: Diagnosis not present

## 2018-12-30 DIAGNOSIS — I5043 Acute on chronic combined systolic (congestive) and diastolic (congestive) heart failure: Secondary | ICD-10-CM | POA: Diagnosis not present

## 2018-12-30 DIAGNOSIS — F4321 Adjustment disorder with depressed mood: Secondary | ICD-10-CM | POA: Diagnosis not present

## 2018-12-30 DIAGNOSIS — I2510 Atherosclerotic heart disease of native coronary artery without angina pectoris: Secondary | ICD-10-CM | POA: Diagnosis not present

## 2018-12-30 HISTORY — PX: REVISION OF ARTERIOVENOUS GORETEX GRAFT: SHX6073

## 2018-12-30 LAB — POCT I-STAT 4, (NA,K, GLUC, HGB,HCT)
Glucose, Bld: 76 mg/dL (ref 70–99)
HCT: 30 % — ABNORMAL LOW (ref 39.0–52.0)
Hemoglobin: 10.2 g/dL — ABNORMAL LOW (ref 13.0–17.0)
Potassium: 4.6 mmol/L (ref 3.5–5.1)
Sodium: 135 mmol/L (ref 135–145)

## 2018-12-30 SURGERY — REVISION OF ARTERIOVENOUS GORETEX GRAFT
Anesthesia: Monitor Anesthesia Care | Site: Arm Upper | Laterality: Left

## 2018-12-30 MED ORDER — PHENYLEPHRINE HCL 10 MG/ML IJ SOLN
INTRAMUSCULAR | Status: DC | PRN
  Administered 2018-12-30: 80 ug via INTRAVENOUS

## 2018-12-30 MED ORDER — CHLORHEXIDINE GLUCONATE 4 % EX LIQD: 60.0000 mL | Freq: Once | CUTANEOUS | Status: DC

## 2018-12-30 MED ORDER — MIDAZOLAM HCL 2 MG/2ML IJ SOLN
INTRAMUSCULAR | Status: AC
  Filled 2018-12-30: qty 2

## 2018-12-30 MED ORDER — FENTANYL CITRATE (PF) 100 MCG/2ML IJ SOLN
INTRAMUSCULAR | Status: DC | PRN
  Administered 2018-12-30: 25 ug via INTRAVENOUS

## 2018-12-30 MED ORDER — SODIUM CHLORIDE 0.9 % IV SOLN
INTRAVENOUS | Status: DC
  Administered 2018-12-30: 09:00:00 via INTRAVENOUS

## 2018-12-30 MED ORDER — ACETAMINOPHEN 500 MG PO TABS
1000.0000 mg | ORAL_TABLET | Freq: Once | ORAL | Status: AC
  Administered 2018-12-30: 1000 mg via ORAL
  Filled 2018-12-30: qty 2

## 2018-12-30 MED ORDER — LIDOCAINE-EPINEPHRINE 0.5 %-1:200000 IJ SOLN
INTRAMUSCULAR | Status: DC | PRN
  Administered 2018-12-30: 3 mL

## 2018-12-30 MED ORDER — PROPOFOL 500 MG/50ML IV EMUL
INTRAVENOUS | Status: DC | PRN
  Administered 2018-12-30: 75 ug/kg/min via INTRAVENOUS

## 2018-12-30 MED ORDER — SODIUM CHLORIDE 0.9 % IV SOLN
INTRAVENOUS | Status: DC | PRN
  Administered 2018-12-30: 500 mL

## 2018-12-30 MED ORDER — SODIUM CHLORIDE 0.9 % IV SOLN
INTRAVENOUS | Status: AC
  Filled 2018-12-30: qty 1.2

## 2018-12-30 MED ORDER — MIDAZOLAM HCL 5 MG/5ML IJ SOLN
INTRAMUSCULAR | Status: DC | PRN
  Administered 2018-12-30: 1 mg via INTRAVENOUS

## 2018-12-30 MED ORDER — ALBUMIN HUMAN 5 % IV SOLN
INTRAVENOUS | Status: DC | PRN
  Administered 2018-12-30: 11:00:00 via INTRAVENOUS

## 2018-12-30 MED ORDER — 0.9 % SODIUM CHLORIDE (POUR BTL) OPTIME
TOPICAL | Status: DC | PRN
  Administered 2018-12-30: 1000 mL

## 2018-12-30 MED ORDER — PROPOFOL 1000 MG/100ML IV EMUL
INTRAVENOUS | Status: AC
  Filled 2018-12-30: qty 100

## 2018-12-30 MED ORDER — FENTANYL CITRATE (PF) 250 MCG/5ML IJ SOLN
INTRAMUSCULAR | Status: AC
  Filled 2018-12-30: qty 5

## 2018-12-30 MED ORDER — PROPOFOL 10 MG/ML IV BOLUS
INTRAVENOUS | Status: DC | PRN
  Administered 2018-12-30: 20 mg via INTRAVENOUS
  Administered 2018-12-30: 15 mg via INTRAVENOUS

## 2018-12-30 MED ORDER — LIDOCAINE HCL (CARDIAC) PF 100 MG/5ML IV SOSY
PREFILLED_SYRINGE | INTRAVENOUS | Status: DC | PRN
  Administered 2018-12-30: 60 mg via INTRATRACHEAL

## 2018-12-30 MED ORDER — METOPROLOL TARTRATE 12.5 MG HALF TABLET
25.0000 mg | ORAL_TABLET | Freq: Once | ORAL | Status: AC
  Administered 2018-12-30: 25 mg via ORAL
  Filled 2018-12-30: qty 2

## 2018-12-30 MED ORDER — ONDANSETRON HCL 4 MG/2ML IJ SOLN
INTRAMUSCULAR | Status: DC | PRN
  Administered 2018-12-30: 4 mg via INTRAVENOUS

## 2018-12-30 MED ORDER — LIDOCAINE-EPINEPHRINE 0.5 %-1:200000 IJ SOLN
INTRAMUSCULAR | Status: AC
  Filled 2018-12-30: qty 1

## 2018-12-30 MED ORDER — CEFAZOLIN SODIUM-DEXTROSE 2-4 GM/100ML-% IV SOLN
2.0000 g | INTRAVENOUS | Status: AC
  Administered 2018-12-30: 2 g via INTRAVENOUS
  Filled 2018-12-30: qty 100

## 2018-12-30 MED ORDER — SODIUM CHLORIDE 0.9 % IV SOLN
INTRAVENOUS | Status: DC | PRN
  Administered 2018-12-30: 40 ug/min via INTRAVENOUS

## 2018-12-30 SURGICAL SUPPLY — 32 items
CANISTER SUCT 3000ML PPV (MISCELLANEOUS) ×3 IMPLANT
CANNULA VESSEL 3MM 2 BLNT TIP (CANNULA) ×3 IMPLANT
CLIP LIGATING EXTRA MED SLVR (CLIP) ×3 IMPLANT
CLIP LIGATING EXTRA SM BLUE (MISCELLANEOUS) ×3 IMPLANT
COVER WAND RF STERILE (DRAPES) ×3 IMPLANT
DECANTER SPIKE VIAL GLASS SM (MISCELLANEOUS) ×3 IMPLANT
DERMABOND ADVANCED (GAUZE/BANDAGES/DRESSINGS) ×2
DERMABOND ADVANCED .7 DNX12 (GAUZE/BANDAGES/DRESSINGS) ×1 IMPLANT
ELECT REM PT RETURN 9FT ADLT (ELECTROSURGICAL) ×3
ELECTRODE REM PT RTRN 9FT ADLT (ELECTROSURGICAL) ×1 IMPLANT
GAUZE SPONGE 4X4 12PLY STRL LF (GAUZE/BANDAGES/DRESSINGS) ×3 IMPLANT
GLOVE SS BIOGEL STRL SZ 7.5 (GLOVE) ×1 IMPLANT
GLOVE SUPERSENSE BIOGEL SZ 7.5 (GLOVE) ×2
GOWN STRL NON-REIN LRG LVL3 (GOWN DISPOSABLE) ×3 IMPLANT
GOWN STRL REUS W/ TWL LRG LVL3 (GOWN DISPOSABLE) ×3 IMPLANT
GOWN STRL REUS W/TWL LRG LVL3 (GOWN DISPOSABLE) ×6
KIT BASIN OR (CUSTOM PROCEDURE TRAY) ×3 IMPLANT
KIT TURNOVER KIT B (KITS) ×3 IMPLANT
NEEDLE HYPO 25GX1X1/2 BEV (NEEDLE) ×3 IMPLANT
NS IRRIG 1000ML POUR BTL (IV SOLUTION) ×3 IMPLANT
PACK CV ACCESS (CUSTOM PROCEDURE TRAY) ×3 IMPLANT
PAD ARMBOARD 7.5X6 YLW CONV (MISCELLANEOUS) ×6 IMPLANT
SUT ETHILON 3 0 PS 1 (SUTURE) ×3 IMPLANT
SUT PROLENE 5 0 C 1 24 (SUTURE) ×3 IMPLANT
SUT PROLENE 6 0 CC (SUTURE) ×6 IMPLANT
SUT SILK 2 0 PERMA HAND 18 BK (SUTURE) IMPLANT
SUT VIC AB 3-0 SH 27 (SUTURE) ×4
SUT VIC AB 3-0 SH 27X BRD (SUTURE) ×2 IMPLANT
TAPE CLOTH SURG 4X10 WHT LF (GAUZE/BANDAGES/DRESSINGS) ×3 IMPLANT
TOWEL GREEN STERILE (TOWEL DISPOSABLE) ×3 IMPLANT
UNDERPAD 30X30 (UNDERPADS AND DIAPERS) ×3 IMPLANT
WATER STERILE IRR 1000ML POUR (IV SOLUTION) ×3 IMPLANT

## 2018-12-30 NOTE — Transfer of Care (Signed)
Immediate Anesthesia Transfer of Care Note  Patient: Trevor [name]  Procedure(s) Performed: REVISION OF ARTERIOVENOUS FISTULA LEFT ARM (Left Arm Upper)  Patient Location: PACU  Anesthesia Type:MAC  Level of Consciousness: awake, alert  and oriented  Airway & Oxygen Therapy: Patient Spontanous Breathing and Patient connected to face mask oxygen  Post-op Assessment: Report given to RN, Post -op Vital signs reviewed and stable and Patient moving all extremities  Post vital signs: Reviewed and stable  Last Vitals:  Vitals Value Taken Time  BP 117/40 12/30/2018 11:31 AM  Temp    Pulse 64 12/30/2018 11:36 AM  Resp 30 12/30/2018 11:36 AM  SpO2 100 % 12/30/2018 11:36 AM  Vitals shown include unvalidated device data.  Last Pain:  Vitals:   12/30/18 1130  TempSrc:   PainSc: (P) Asleep      Patients Stated Pain Goal: 3 (33/29/51 8841)  Complications: No apparent anesthesia complications

## 2018-12-30 NOTE — Anesthesia Postprocedure Evaluation (Signed)
Anesthesia Post Note  Patient: Trevor [name]  Procedure(s) Performed: REVISION OF ARTERIOVENOUS FISTULA LEFT ARM (Left Arm Upper)     Patient location during evaluation: PACU Anesthesia Type: MAC Level of consciousness: awake and alert Pain management: pain level controlled Vital Signs Assessment: post-procedure vital signs reviewed and stable Respiratory status: spontaneous breathing, nonlabored ventilation and respiratory function stable Cardiovascular status: stable and blood pressure returned to baseline Postop Assessment: no apparent nausea or vomiting Anesthetic complications: no    Last Vitals:  Vitals:   12/30/18 1215 12/30/18 1230  BP: 116/63 (!) 130/59  Pulse: 62 61  Resp: 20 14  Temp:    SpO2: 100% 100%    Last Pain:  Vitals:   12/30/18 1230  TempSrc:   PainSc: 0-No pain                 Roylene Heaton,W. EDMOND

## 2018-12-30 NOTE — Discharge Instructions (Signed)
[facility]  Discharge Instructions  AV Fistula or Graft Surgery for Dialysis Access  Please refer to the following instructions for your post-procedure care. Your surgeon or physician assistant will discuss any changes with you.  Activity  You may drive the day following your surgery, if you are comfortable and no longer taking prescription pain medication. Resume full activity as the soreness in your incision resolves.  Bathing/Showering  You may shower after you go home. Keep your incision dry for 48 hours. Do not soak in a bathtub, hot tub, or swim until the incision heals completely. You may not shower if you have a hemodialysis catheter.  Incision Care  Clean your incision with mild soap and water after 48 hours. Pat the area dry with a clean towel. You do not need a bandage unless otherwise instructed. Do not apply any ointments or creams to your incision. You may have skin glue on your incision. Do not peel it off. It will come off on its own in about one week. Your arm may swell a bit after surgery. To reduce swelling use pillows to elevate your arm so it is above your heart. Your doctor will tell you if you need to lightly wrap your arm with an ACE bandage.  Diet  Resume your normal diet. There are not special food restrictions following this procedure. In order to heal from your surgery, it is CRITICAL to get adequate nutrition. Your body requires vitamins, minerals, and protein. Vegetables are the best source of vitamins and minerals. Vegetables also provide the perfect balance of protein. Processed food has little nutritional value, so try to avoid this.  Medications  Resume taking all of your medications. If your incision is causing pain, you may take over-the counter pain relievers such as acetaminophen (Tylenol). If you were prescribed a stronger pain medication, please be aware these medications can cause nausea and constipation. Prevent  nausea by taking the medication with a snack or meal. Avoid constipation by drinking plenty of fluids and eating foods with high amount of fiber, such as fruits, vegetables, and grains.  Do not take Tylenol if you are taking prescription pain medications.  Follow up Your surgeon may want to see you in the office following your access surgery. If so, this will be arranged at the time of your surgery.  Please call us immediately for any of the following conditions:  Increased pain, redness, drainage (pus) from your incision site Fever of 101 degrees or higher Severe or worsening pain at your incision site Hand pain or numbness.  Reduce your risk of vascular disease:  Stop smoking. If you would like help, call [facility] at 1-800-QUIT-NOW (484) 301-0331) or [place] at [place] your cholesterol Maintain a desired weight Control your diabetes Keep your blood pressure down  Dialysis  It will take several weeks to several months for your new dialysis access to be ready for use. Your surgeon will determine when it is okay to use it. Your nephrologist will continue to direct your dialysis. You can continue to use your Permcath until your new access is ready for use.   12/30/2018 Trevor [name] [id number] 01/15/1946  Surgeon(s): Taliah Porche, Arvilla Meres, MD  Procedure(s): REVISION OF ARTERIOVENOUS FISTULA LEFT ARM   May stick graft immediately  x May stick graft on designated area only:    Do not stick distal repair area for 6 weeks    If you have any questions, please call the office  at [phone].

## 2018-12-30 NOTE — Op Note (Signed)
    OPERATIVE REPORT  DATE OF SURGERY: 12/30/2018  PATIENT: Trevor [name], 73 y.o. 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  PHYSICIAN ASSISTANT: Nurse  ANESTHESIA: Local with sedation  EBL: per anesthesia record  Total I/O In: 250 [IV Piggyback:250] Out: -   BLOOD ADMINISTERED: none  DRAINS: none  SPECIMEN: none  COUNTS CORRECT:  YES  PATIENT DISPOSITION:  PACU - hemodynamically stable  PROCEDURE DETAILS: The patient was taken the operating place evaluation where the area of the left arm was prepped draped in sterile fashion.  An ellipse of skin was taken around the area of the false aneurysm and the ulceration over the vein.  This was carried down to isolate the basilic vein fistula above and below the area of ulceration this was circumferentially mobilized.  The vein was occluded proximal and distal to the abraded area and the eroded area was excised.  The vein was repaired primarily with interrupted 5-0 Prolene sutures.  The vein was slightly rotated to keep the suture line away from the more superficial component of the fistula.  The skin was closed with interrupted 3-0 nylon mattress sutures.  Sterile dressing was applied the patient was transferred to the recovery room in stable condition   Rosetta Posner, M.D., [facility] 12/30/2018 11:30 AM

## 2018-12-30 NOTE — Interval H&P Note (Signed)
History and Physical Interval Note:  12/30/2018 9:40 AM  Trevor [name]  has presented today for surgery, with the diagnosis of COMPLICATION OF FISTUA LEFT ARM  The various methods of treatment have been discussed with the patient and family. After consideration of risks, benefits and other options for treatment, the patient has consented to  Procedure(s): REVISION OF ARTERIOVENOUS FISTULA LEFT ARM (Left) as a surgical intervention .  The patient's history has been reviewed, patient examined, no change in status, stable for surgery.  I have reviewed the patient's chart and labs.  Questions were answered to the patient's satisfaction.     Curt Jews

## 2018-12-30 NOTE — Progress Notes (Signed)
Called facility to obtain last dosage of all medications. [name], facility nurse gave information.

## 2018-12-31 ENCOUNTER — Encounter (HOSPITAL_COMMUNITY): Payer: Self-pay | Admitting: Vascular Surgery

## 2018-12-31 DIAGNOSIS — R41841 Cognitive communication deficit: Secondary | ICD-10-CM | POA: Diagnosis not present

## 2018-12-31 DIAGNOSIS — D631 Anemia in chronic kidney disease: Secondary | ICD-10-CM | POA: Diagnosis not present

## 2018-12-31 DIAGNOSIS — I639 Cerebral infarction, unspecified: Secondary | ICD-10-CM | POA: Diagnosis not present

## 2018-12-31 DIAGNOSIS — N186 End stage renal disease: Secondary | ICD-10-CM | POA: Diagnosis not present

## 2019-01-02 DIAGNOSIS — N186 End stage renal disease: Secondary | ICD-10-CM | POA: Diagnosis not present

## 2019-01-02 DIAGNOSIS — R41841 Cognitive communication deficit: Secondary | ICD-10-CM | POA: Diagnosis not present

## 2019-01-02 DIAGNOSIS — I639 Cerebral infarction, unspecified: Secondary | ICD-10-CM | POA: Diagnosis not present

## 2019-01-02 DIAGNOSIS — D631 Anemia in chronic kidney disease: Secondary | ICD-10-CM | POA: Diagnosis not present

## 2019-01-03 DIAGNOSIS — I639 Cerebral infarction, unspecified: Secondary | ICD-10-CM | POA: Diagnosis not present

## 2019-01-03 DIAGNOSIS — N186 End stage renal disease: Secondary | ICD-10-CM | POA: Diagnosis not present

## 2019-01-03 DIAGNOSIS — D631 Anemia in chronic kidney disease: Secondary | ICD-10-CM | POA: Diagnosis not present

## 2019-01-03 DIAGNOSIS — R41841 Cognitive communication deficit: Secondary | ICD-10-CM | POA: Diagnosis not present

## 2019-01-04 DIAGNOSIS — M19012 Primary osteoarthritis, left shoulder: Secondary | ICD-10-CM | POA: Diagnosis not present

## 2019-01-04 DIAGNOSIS — M25552 Pain in left hip: Secondary | ICD-10-CM | POA: Diagnosis not present

## 2019-01-04 DIAGNOSIS — N186 End stage renal disease: Secondary | ICD-10-CM | POA: Diagnosis not present

## 2019-01-04 DIAGNOSIS — M25512 Pain in left shoulder: Secondary | ICD-10-CM | POA: Diagnosis not present

## 2019-01-04 DIAGNOSIS — R296 Repeated falls: Secondary | ICD-10-CM | POA: Diagnosis not present

## 2019-01-04 DIAGNOSIS — M19072 Primary osteoarthritis, left ankle and foot: Secondary | ICD-10-CM | POA: Diagnosis not present

## 2019-01-04 DIAGNOSIS — F4321 Adjustment disorder with depressed mood: Secondary | ICD-10-CM | POA: Diagnosis not present

## 2019-01-04 DIAGNOSIS — M1612 Unilateral primary osteoarthritis, left hip: Secondary | ICD-10-CM | POA: Diagnosis not present

## 2019-01-04 DIAGNOSIS — R41841 Cognitive communication deficit: Secondary | ICD-10-CM | POA: Diagnosis not present

## 2019-01-04 DIAGNOSIS — M79672 Pain in left foot: Secondary | ICD-10-CM | POA: Diagnosis not present

## 2019-01-04 DIAGNOSIS — D631 Anemia in chronic kidney disease: Secondary | ICD-10-CM | POA: Diagnosis not present

## 2019-01-04 DIAGNOSIS — I639 Cerebral infarction, unspecified: Secondary | ICD-10-CM | POA: Diagnosis not present

## 2019-01-05 DIAGNOSIS — I639 Cerebral infarction, unspecified: Secondary | ICD-10-CM | POA: Diagnosis not present

## 2019-01-05 DIAGNOSIS — N186 End stage renal disease: Secondary | ICD-10-CM | POA: Diagnosis not present

## 2019-01-05 DIAGNOSIS — D631 Anemia in chronic kidney disease: Secondary | ICD-10-CM | POA: Diagnosis not present

## 2019-01-05 DIAGNOSIS — R41841 Cognitive communication deficit: Secondary | ICD-10-CM | POA: Diagnosis not present

## 2019-01-07 ENCOUNTER — Emergency Department (HOSPITAL_COMMUNITY)
Admission: EM | Admit: 2019-01-07 | Discharge: 2019-01-07 | Disposition: A | Discharge status: Home / Self Care | Payer: Medicare Other | Attending: Emergency Medicine | Admitting: Emergency Medicine

## 2019-01-07 ENCOUNTER — Encounter (HOSPITAL_COMMUNITY): Payer: Self-pay | Admitting: Emergency Medicine

## 2019-01-07 DIAGNOSIS — F17210 Nicotine dependence, cigarettes, uncomplicated: Secondary | ICD-10-CM | POA: Insufficient documentation

## 2019-01-07 DIAGNOSIS — I2510 Atherosclerotic heart disease of native coronary artery without angina pectoris: Secondary | ICD-10-CM | POA: Insufficient documentation

## 2019-01-07 DIAGNOSIS — Y658 Other specified misadventures during surgical and medical care: Secondary | ICD-10-CM | POA: Diagnosis not present

## 2019-01-07 DIAGNOSIS — Z7902 Long term (current) use of antithrombotics/antiplatelets: Secondary | ICD-10-CM | POA: Insufficient documentation

## 2019-01-07 DIAGNOSIS — N186 End stage renal disease: Secondary | ICD-10-CM | POA: Diagnosis not present

## 2019-01-07 DIAGNOSIS — T829XXA Unspecified complication of cardiac and vascular prosthetic device, implant and graft, initial encounter: Secondary | ICD-10-CM | POA: Insufficient documentation

## 2019-01-07 DIAGNOSIS — I132 Hypertensive heart and chronic kidney disease with heart failure and with stage 5 chronic kidney disease, or end stage renal disease: Secondary | ICD-10-CM | POA: Insufficient documentation

## 2019-01-07 DIAGNOSIS — Z79899 Other long term (current) drug therapy: Secondary | ICD-10-CM | POA: Diagnosis not present

## 2019-01-07 DIAGNOSIS — R58 Hemorrhage, not elsewhere classified: Secondary | ICD-10-CM | POA: Diagnosis not present

## 2019-01-07 DIAGNOSIS — Z7982 Long term (current) use of aspirin: Secondary | ICD-10-CM | POA: Insufficient documentation

## 2019-01-07 DIAGNOSIS — I5043 Acute on chronic combined systolic (congestive) and diastolic (congestive) heart failure: Secondary | ICD-10-CM | POA: Insufficient documentation

## 2019-01-07 NOTE — Discharge Instructions (Addendum)
If you develop worsening nausea or chest pain or shortness of breath, fatigue, weakness, or lightheadedness please return to the emergency department as you would require laboratory evaluation at that time to make sure your hemoglobin (blood counts) are stable.

## 2019-01-07 NOTE — ED Triage Notes (Signed)
Pt her e from dialysis center with c/o graft bleeding to left arm , clamps applied from dialysis ,

## 2019-01-07 NOTE — ED Notes (Signed)
Clamps removed by MD , no bleeding noted

## 2019-01-07 NOTE — ED Provider Notes (Signed)
[facility] EMERGENCY DEPARTMENT Provider Note   CSN: [id number] Arrival date & time: 01/07/19  1616     History   Chief Complaint No chief complaint on file.   HPI Trevor [name] is a 73 y.o. male.  HPI   Trevor [name] is a 73 y.o. 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  Patient had 3 points of access at dialysis.  They decannulated the first 2 and he reportedly had bleeding for about 1 hour afterward.  He states he was asleep and was feeling normal.  No chest pain or dyspnea no lightheadedness.  Clamps were applied and he was transferred here.  Asymptomatic again on arrival at this time.  He does not think it has been bleeding recently.  Past Medical History:  Diagnosis Date  . AAA (abdominal aortic aneurysm) ([place])   . Anemia   . Anxiety   . Arthritis    arms and back  . Blindness   . CHF (congestive heart failure) ([place])   . Coronary artery disease    mild   . 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 ESRD on hemodialysis ([place])    started HD 2014-15  . Headache(784.0)   . HTN (hypertension)   . Protein-calorie malnutrition ([place])   . STEMI (ST elevation myocardial infarction) ([place]) 11/11/2018  . Stroke [facility]) 01/2018   no residual deficits    Patient Active Problem List   Diagnosis Date Noted  . STEMI (ST elevation myocardial infarction) ([place]) 11/11/2018  . NSTEMI (non-ST elevated myocardial infarction) ([place]) 11/11/2018  . Pulmonary edema 09/20/2018  . Acute respiratory failure with hypoxia ([place]) 03/29/2018  . ESRD on dialysis ([place]) 03/29/2018  . Sleep stage dysfunction  11/10/2017  . 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 Vascular device, implant, or graft complication 94/50/3888  . Peripheral arterial disease ([place]) 10/20/2017  . Non-allergic rhinitis 10/20/2017  . 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 BPH (benign prostatic hyperplasia) 07/07/2017  . At risk for adverse drug reaction 07/02/2017  . Ischemia of extremity 06/28/2017  . Acute pulmonary edema ([place]) 06/27/2017  . Hypertensive urgency 01/24/2017  . 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 Ischemic chest pain ([place]) 11/01/2016  . Essential hypertension 03/08/2016  . Dyslipidemia 01/12/2016  . Chest pain 08/12/2015  . Accelerated hypertension 08/12/2015  . Carotid stenosis   . Abdominal aortic aneurysm ([place]) 03/06/2015  . Protein-calorie malnutrition, severe ([place]) 05/20/2014  . End-stage renal disease on hemodialysis ([place]) 05/19/2014  . Anemia of renal disease 05/19/2014  . Coronary artery disease, non-occlusive:  09/28/2012  . Secondary hyperparathyroidism ([place]) 09/27/2012  . Non compliance with medical treatment 09/27/2012  . Acute on chronic combined systolic and diastolic CHF (congestive heart failure) ([place]) 10/10/2011  . A-fib ([place]) 10/08/2011  . History of CVA (cerebrovascular accident) 10/07/2011  . Cocaine abuse ([place]) 10/07/2011  . Tobacco use disorder 08/07/2011  . Bruit 08/07/2011    Past Surgical History:  Procedure Laterality Date  . A/V FISTULAGRAM N/A 07/29/2018   Procedure: A/V FISTULAGRAM - left upper extremity;  Surgeon: Waynetta Sandy, MD;  Location: [facility] CV LAB;  Service: Cardiovascular;  Laterality:  N/A;  . AXILLARY-FEMORAL BYPASS GRAFT  06/28/2017   Procedure: BYPASS GRAFT RIGHT AXILLA-BIFEMORAL USING 8MM X 30CM AND 8MM X 60CM HEMASHIELD GOLD GRAFTS;  Surgeon: Rosetta Posner, MD;  Location: [facility];  Service: Vascular;;  . CARDIAC CATHETERIZATION N/A 11/01/2016   Procedure: Left Heart Cath and Coronary Angiography;  Surgeon: Lorretta Harp, MD;   Location: [facility];  Service: Cardiovascular;  Laterality: N/A;  . ENDARTERECTOMY FEMORAL Bilateral 06/28/2017   Procedure: ENDARTERECTOMY BILATERAL FEMORAL ARTERIES;  Surgeon: Rosetta Posner, MD;  Location: [facility];  Service: Vascular;  Laterality: Bilateral;  . FISTULA SUPERFICIALIZATION Left 06/10/6578   Procedure: PLICATION OF ARTERIOVENOUS FISTULA LEFT ARM;  Surgeon: Waynetta Sandy, MD;  Location: [facility];  Service: Vascular;  Laterality: Left;  . FISTULOGRAM Left 05/20/2018   Procedure: FISTULOGRAM WITH BALLOON ANGIOPLASTY LEFT ARM ARTERIOVENOUS FISTULA;  Surgeon: Waynetta Sandy, MD;  Location: [facility];  Service: Vascular;  Laterality: Left;  . INSERTION OF DIALYSIS CATHETER  09/21/2012   Procedure: INSERTION OF DIALYSIS CATHETER;  Surgeon: Angelia Mould, MD;  Location: [facility];  Service: Vascular;  Laterality: N/A;  Right Internal Jugular Placement  . LEFT HEART CATH N/A 11/11/2018   Procedure: LEFT HEART CATH;  Surgeon: Sherren Mocha, MD;  Location: [facility];  Service: Cardiovascular;  Laterality: N/A;  . LEFT HEART CATHETERIZATION WITH CORONARY ANGIOGRAM N/A 09/28/2012   Procedure: LEFT HEART CATHETERIZATION WITH CORONARY ANGIOGRAM;  Surgeon: Sherren Mocha, MD;  Location: [facility] CATH LAB;  Service: Cardiovascular;  Laterality: N/A;  . LEFT HEART CATHETERIZATION WITH CORONARY ANGIOGRAM N/A 07/07/2014   Procedure: LEFT HEART CATHETERIZATION WITH CORONARY ANGIOGRAM;  Surgeon: Leonie Man, MD;  Location: [facility] CATH LAB;  Service: Cardiovascular;  Laterality: N/A;  . PERIPHERAL VASCULAR BALLOON ANGIOPLASTY Left 07/29/2018   Procedure: PERIPHERAL VASCULAR BALLOON ANGIOPLASTY;  Surgeon: Waynetta Sandy, MD;  Location: [facility] CV LAB;  Service: Cardiovascular;  Laterality: Left;  CENTRAL VEIN  . PERIPHERAL VASCULAR INTERVENTION Left 07/29/2018   Procedure: PERIPHERAL VASCULAR INTERVENTION;  Surgeon: Waynetta Sandy, MD;  Location: [facility] CV LAB;  Service: Cardiovascular;  Laterality: Left;  AXILLARY VEIN  . REVISION OF ARTERIOVENOUS GORETEX GRAFT Left 12/30/2018   Procedure: REVISION OF ARTERIOVENOUS FISTULA LEFT ARM;  Surgeon: Rosetta Posner, MD;  Location: [facility];  Service: Vascular;  Laterality: Left;  . REVISON OF ARTERIOVENOUS FISTULA Left 08/17/2018   Procedure: REVISION OF ARTERIOVENOUS FISTULA LEFT ARM;  Surgeon: Waynetta Sandy, MD;  Location: [facility];  Service: Vascular;  Laterality: Left;  . right hand    . TEE WITHOUT CARDIOVERSION  10/09/2011   Procedure: TRANSESOPHAGEAL ECHOCARDIOGRAM (TEE);  Surgeon: Lelon Perla, MD;  Location: [facility] ENDOSCOPY;  Service: Cardiovascular;  Laterality: N/A;        Home Medications    Prior to Admission medications   Medication Sig Start Date End Date Taking? Authorizing Provider  aspirin EC 81 MG tablet Take 1 tablet (81 mg total) by mouth daily. 11/15/18   Bhagat, Crista Luria, PA  atorvastatin (LIPITOR) 80 MG tablet Take 1 tablet (80 mg total) by mouth at bedtime. 11/15/18   Bhagat, Crista Luria, PA  bisacodyl (DULCOLAX) 10 MG suppository Place 10 mg rectally daily as needed (constipation).     [provider]  Cholecalciferol (D3-1000) 25 MCG (1000 UT) tablet Take 1,000 Units by mouth daily.    [provider]  cinacalcet (SENSIPAR) 90 MG tablet Take 90 mg by mouth daily with supper.  [provider]  clopidogrel (PLAVIX) 75 MG tablet Take 1 tablet (75 mg total) by mouth daily. 04/05/15   Barton Dubois, MD  diclofenac sodium (VOLTAREN) 1 % GEL Apply 2 g topically 4 (four) times daily as needed (PAIN IN LEFT FOOT).     [provider]  docusate sodium (COLACE) 100 MG capsule Take 200 mg by mouth at bedtime.     [provider]  doxercalciferol (HECTOROL) 4 MCG/2ML injection Inject 1 mL (2 mcg total) into the vein every Monday, Wednesday, and Friday with hemodialysis. Patient not taking: Reported on 12/29/2018 01/15/18    Colbert Ewing, MD  gabapentin (NEURONTIN) 300 MG capsule Take 1 capsule (300 mg total) by mouth at bedtime. 01/14/18   Colbert Ewing, MD  Glycopyrrolate [facility] REFILL KIT) 25 MCG/ML SOLN Inhale 25 mcg into the lungs 2 (two) times daily.    [provider]  HYDROcodone-acetaminophen (NORCO) 5-325 MG tablet Take 1 tablet by mouth every 6 (six) hours as needed for moderate pain. Patient not taking: Reported on 12/29/2018 08/17/18   Dagoberto Ligas, PA-C  Icosapent Ethyl (VASCEPA) 1 g CAPS Take 1 g by mouth 2 (two) times daily.     [provider]  ipratropium-albuterol (DUONEB) 0.5-2.5 (3) MG/3ML SOLN Take 3 mLs by nebulization every 8 (eight) hours as needed (for copd).    [provider]  isosorbide mononitrate (IMDUR) 30 MG 24 hr tablet Take 1 tablet (30 mg total) by mouth daily. 11/22/18 02/20/19  Erlene Quan, PA-C  metoprolol tartrate (LOPRESSOR) 25 MG tablet Take 0.5 tablets (12.5 mg total) by mouth 2 (two) times daily. 11/15/18   Bhagat, Crista Luria, PA  mirtazapine (REMERON) 7.5 MG tablet Take 7.5 mg by mouth at bedtime.    [provider]  nitroGLYCERIN (NITROSTAT) 0.4 MG SL tablet Place 1 tablet (0.4 mg total) under the tongue every 5 (five) minutes x 3 doses as needed for chest pain. 11/15/18   Leanor Kail, PA  Nutritional Supplements (ENSURE PO) Take 237 mLs by mouth 2 (two) times daily.     [provider]  Nutritional Supplements (FEEDING SUPPLEMENT, NEPRO CARB STEADY,) LIQD Take 237 mLs by mouth 2 (two) times daily between meals. 12/11/16   Geradine Girt, DO  ondansetron (ZOFRAN) 4 MG tablet Take 4 mg by mouth every 6 (six) hours as needed for nausea or vomiting.    [provider]  senna (SENOKOT) 8.6 MG TABS tablet Take 1 tablet by mouth daily.    [provider]  sevelamer carbonate (RENVELA) 800 MG tablet Take 1,600 mg by mouth 3 (three) times daily with meals.  12/13/18   [provider]     Family History Family History  Problem Relation Age of Onset  . Heart attack Mother        MI in her 55s  . Diabetes Mother   . Alcohol abuse Father   . Anesthesia problems Neg Hx   . Hypotension Neg Hx   . Malignant hyperthermia Neg Hx   . Pseudochol deficiency Neg Hx     Social History Social History   Tobacco Use  . Smoking status: Current Every Day Smoker    Packs/day: 0.50    Years: 60.00    Pack years: 30.00    Types: Cigarettes  . Smokeless tobacco: Never Used  . Tobacco comment: 5-6 cigarettes per day  Substance Use Topics  . Alcohol use: No    Alcohol/week: 0.0 standard drinks    Comment:  Occasional  . Drug use: No    Frequency: 2.0 times per week    Types: "Crack" cocaine     Allergies   Patient has no known allergies.   Review of Systems Review of Systems  Constitutional: Negative for chills and fever.  HENT: Negative for ear pain and sore throat.   Eyes: Negative for pain and visual disturbance.  Respiratory: Negative for cough and shortness of breath.   Cardiovascular: Negative for chest pain and palpitations.  Gastrointestinal: Negative for abdominal pain and vomiting.  Genitourinary: Negative for dysuria and hematuria.  Musculoskeletal: Negative for arthralgias and back pain.  Skin: Positive for wound. Negative for color change and rash.  Neurological: Negative for seizures and syncope.  All other systems reviewed and are negative.    Physical Exam Updated Vital Signs BP 121/70   Pulse 67   Temp 98.1 F (36.7 C) (Oral)   Resp 18   SpO2 99%   Physical Exam Vitals signs and nursing note reviewed.  Constitutional:      Appearance: Normal appearance. He is well-developed. He is not ill-appearing.  HENT:     Head: Normocephalic and atraumatic.  Eyes:     Conjunctiva/sclera: Conjunctivae normal.  Neck:     Musculoskeletal: Neck supple.  Cardiovascular:     Rate and Rhythm: Normal rate and regular rhythm.     Pulses:           Radial pulses are 2+ on the right side and 2+ on the left side.     Heart sounds: No murmur.     Arteriovenous access: left arteriovenous access is present.    Comments: AV fistula in the left upper arm with normal/thrill. Pulmonary:     Effort: Pulmonary effort is normal. No respiratory distress.     Breath sounds: Normal breath sounds.  Abdominal:     Palpations: Abdomen is soft.     Tenderness: There is no abdominal tenderness.  Skin:    General: Skin is warm and dry.  Neurological:     General: No focal deficit present.     Mental Status: He is alert and oriented to person, place, and time.     GCS: GCS eye subscore is 4. GCS verbal subscore is 5. GCS motor subscore is 6.     Sensory: Sensation is intact.     Motor: Motor function is intact.  Psychiatric:        Behavior: Behavior is cooperative.      ED Treatments / Results  Labs (all labs ordered are listed, but only abnormal results are displayed) Labs Reviewed - No data to display  EKG None  Radiology No results found.  Procedures Procedures (including critical care time)  Medications Ordered in ED Medications - No data to display   Initial Impression / Assessment and Plan / ED Course  I have reviewed the triage vital signs and the nursing notes.  Pertinent labs & imaging results that were available during my care of the patient were reviewed by me and considered in my medical decision making (see chart for details).     MDM:  Imaging: None indicated at this time.    ED Provider Interpretation of EKG: None indicated at this time.   Labs: None indicated at this time.   On initial evaluation, patient appears comfortable. Afebrile and hemodynamically stable. Alert and oriented x4, pleasant, and cooperative.  Presents with bleeding from dialysis as detailed above.  Patient is status post resection of skin and  aneurysm of ulceration of left upper arm basilic vein fistula with primary closure on 12/30/2018 by  vascular surgery here.  On exam, patient has 2 clamps of the proximal left upper arm at the site of his brachiobasilic AV fistula.  One access point distal to these remains in place with catheter clamped from dialysis.  Pressure-point clamps removed from the proximal arm with no active bleeding.  Bandages intact.  aVF with normal thrill/hum.  Neurovascularly intact distal to his fistula.  No evidence for occlusion.  IV team, bedside nurse to remove the reported access.  Pressure applied and no active bleeding.  Wound dressed. Low suspicion for significant blood loss anemia.  However, patient did report some mild nausea although he was able to tolerate p.o. in the ED.  Discussed ordering CBC to further assess counts the patient stated that his nausea resolved and he would like to be discharged home.  Family present at bedside for transportation.  He received a full dialysis session today and has no evidence for volume overload.  No tachycardia or hypotension.  Discharged back to his home with regular scheduled follow-up and strict return precautions.  The plan for this patient was discussed with Dr. Vanita Panda who voiced agreement and who oversaw evaluation and treatment of this patient.   The patient was fully informed and involved with the history taking, evaluation, workup including labs/images, and plan. The patient's concerns and questions were addressed to the patient's satisfaction and he expressed agreement with the plan to DC home.    Final Clinical Impressions(s) / ED Diagnoses   Final diagnoses:  Complication of arteriovenous dialysis fistula, initial encounter    ED Discharge Orders    None       Zak Gondek, Rodena Goldmann, MD 01/07/19 2100    Carmin Muskrat, MD 01/08/19 [phone]

## 2019-01-07 NOTE — ED Notes (Signed)
Pt provided with turkey sandwich  and ice water 

## 2019-01-09 DIAGNOSIS — N186 End stage renal disease: Secondary | ICD-10-CM | POA: Diagnosis not present

## 2019-01-09 DIAGNOSIS — D631 Anemia in chronic kidney disease: Secondary | ICD-10-CM | POA: Diagnosis not present

## 2019-01-09 DIAGNOSIS — I639 Cerebral infarction, unspecified: Secondary | ICD-10-CM | POA: Diagnosis not present

## 2019-01-09 DIAGNOSIS — R41841 Cognitive communication deficit: Secondary | ICD-10-CM | POA: Diagnosis not present

## 2019-01-10 DIAGNOSIS — R41841 Cognitive communication deficit: Secondary | ICD-10-CM | POA: Diagnosis not present

## 2019-01-10 DIAGNOSIS — N186 End stage renal disease: Secondary | ICD-10-CM | POA: Diagnosis not present

## 2019-01-10 DIAGNOSIS — D631 Anemia in chronic kidney disease: Secondary | ICD-10-CM | POA: Diagnosis not present

## 2019-01-10 DIAGNOSIS — I639 Cerebral infarction, unspecified: Secondary | ICD-10-CM | POA: Diagnosis not present

## 2019-01-11 DIAGNOSIS — N186 End stage renal disease: Secondary | ICD-10-CM | POA: Diagnosis not present

## 2019-01-11 DIAGNOSIS — F4321 Adjustment disorder with depressed mood: Secondary | ICD-10-CM | POA: Diagnosis not present

## 2019-01-11 DIAGNOSIS — I639 Cerebral infarction, unspecified: Secondary | ICD-10-CM | POA: Diagnosis not present

## 2019-01-11 DIAGNOSIS — R41841 Cognitive communication deficit: Secondary | ICD-10-CM | POA: Diagnosis not present

## 2019-01-11 DIAGNOSIS — D631 Anemia in chronic kidney disease: Secondary | ICD-10-CM | POA: Diagnosis not present

## 2019-01-12 DIAGNOSIS — I639 Cerebral infarction, unspecified: Secondary | ICD-10-CM | POA: Diagnosis not present

## 2019-01-12 DIAGNOSIS — R41841 Cognitive communication deficit: Secondary | ICD-10-CM | POA: Diagnosis not present

## 2019-01-12 DIAGNOSIS — D631 Anemia in chronic kidney disease: Secondary | ICD-10-CM | POA: Diagnosis not present

## 2019-01-12 DIAGNOSIS — N186 End stage renal disease: Secondary | ICD-10-CM | POA: Diagnosis not present

## 2019-01-13 DIAGNOSIS — D631 Anemia in chronic kidney disease: Secondary | ICD-10-CM | POA: Diagnosis not present

## 2019-01-13 DIAGNOSIS — R41841 Cognitive communication deficit: Secondary | ICD-10-CM | POA: Diagnosis not present

## 2019-01-13 DIAGNOSIS — M25552 Pain in left hip: Secondary | ICD-10-CM | POA: Diagnosis not present

## 2019-01-13 DIAGNOSIS — M79672 Pain in left foot: Secondary | ICD-10-CM | POA: Diagnosis not present

## 2019-01-13 DIAGNOSIS — N186 End stage renal disease: Secondary | ICD-10-CM | POA: Diagnosis not present

## 2019-01-13 DIAGNOSIS — I639 Cerebral infarction, unspecified: Secondary | ICD-10-CM | POA: Diagnosis not present

## 2019-01-15 DIAGNOSIS — N186 End stage renal disease: Secondary | ICD-10-CM | POA: Diagnosis not present

## 2019-01-15 DIAGNOSIS — R41841 Cognitive communication deficit: Secondary | ICD-10-CM | POA: Diagnosis not present

## 2019-01-15 DIAGNOSIS — I639 Cerebral infarction, unspecified: Secondary | ICD-10-CM | POA: Diagnosis not present

## 2019-01-15 DIAGNOSIS — D631 Anemia in chronic kidney disease: Secondary | ICD-10-CM | POA: Diagnosis not present

## 2019-01-17 DIAGNOSIS — R41841 Cognitive communication deficit: Secondary | ICD-10-CM | POA: Diagnosis not present

## 2019-01-17 DIAGNOSIS — N186 End stage renal disease: Secondary | ICD-10-CM | POA: Diagnosis not present

## 2019-01-17 DIAGNOSIS — D631 Anemia in chronic kidney disease: Secondary | ICD-10-CM | POA: Diagnosis not present

## 2019-01-17 DIAGNOSIS — I639 Cerebral infarction, unspecified: Secondary | ICD-10-CM | POA: Diagnosis not present

## 2019-01-18 ENCOUNTER — Encounter: Payer: Self-pay | Admitting: Vascular Surgery

## 2019-01-18 ENCOUNTER — Other Ambulatory Visit: Payer: Self-pay

## 2019-01-18 ENCOUNTER — Ambulatory Visit (INDEPENDENT_AMBULATORY_CARE_PROVIDER_SITE_OTHER): Payer: Medicare Other | Admitting: Vascular Surgery

## 2019-01-18 VITALS — BP 183/77 | HR 67 | Temp 97.3°F | Resp 14 | Ht 70.0 in | Wt 126.1 lb

## 2019-01-18 DIAGNOSIS — N186 End stage renal disease: Secondary | ICD-10-CM

## 2019-01-18 DIAGNOSIS — F4321 Adjustment disorder with depressed mood: Secondary | ICD-10-CM | POA: Diagnosis not present

## 2019-01-18 DIAGNOSIS — Z992 Dependence on renal dialysis: Secondary | ICD-10-CM

## 2019-01-18 NOTE — Progress Notes (Signed)
Patient name: Trevor [name] MRN: [id number] DOB: Apr 08, 1946 Sex: male  REASON FOR VISIT: Follow-up recent revision of left arm AV fistula  HPI: Trevor [name] is a 73 y.o. male had revision of left arm AV fistula on 12/30/2018 with erosion and bleeding from his fistula site.  He had resection of skin and primary repair of the vein.  Current Outpatient Medications  Medication Sig Dispense Refill  . aspirin EC 81 MG tablet Take 1 tablet (81 mg total) by mouth daily. 90 tablet 3  . atorvastatin (LIPITOR) 80 MG tablet Take 1 tablet (80 mg total) by mouth at bedtime. 30 tablet 6  . bisacodyl (DULCOLAX) 10 MG suppository Place 10 mg rectally daily as needed (constipation).     . Cholecalciferol (D3-1000) 25 MCG (1000 UT) tablet Take 1,000 Units by mouth daily.    . cinacalcet (SENSIPAR) 90 MG tablet Take 90 mg by mouth daily with supper.    . clopidogrel (PLAVIX) 75 MG tablet Take 1 tablet (75 mg total) by mouth daily. 30 tablet 2  . diclofenac sodium (VOLTAREN) 1 % GEL Apply 2 g topically 4 (four) times daily as needed (PAIN IN LEFT FOOT).     Marland Kitchen docusate sodium (COLACE) 100 MG capsule Take 200 mg by mouth at bedtime.     Marland Kitchen doxercalciferol (HECTOROL) 4 MCG/2ML injection Inject 1 mL (2 mcg total) into the vein every Monday, Wednesday, and Friday with hemodialysis. 2 mL   . gabapentin (NEURONTIN) 300 MG capsule Take 1 capsule (300 mg total) by mouth at bedtime. 30 capsule 0  . Glycopyrrolate (LONHALA MAGNAIR REFILL KIT) 25 MCG/ML SOLN Inhale 25 mcg into the lungs 2 (two) times daily.    Marland Kitchen HYDROcodone-acetaminophen (NORCO) 5-325 MG tablet Take 1 tablet by mouth every 6 (six) hours as needed for moderate pain. 12 tablet 0  . Icosapent Ethyl (VASCEPA) 1 g CAPS Take 1 g by mouth 2 (two) times daily.     Marland Kitchen ipratropium-albuterol (DUONEB) 0.5-2.5 (3) MG/3ML SOLN Take 3 mLs by nebulization every 8 (eight) hours as needed (for copd).    . isosorbide mononitrate (IMDUR) 30  MG 24 hr tablet Take 1 tablet (30 mg total) by mouth daily. 90 tablet 3  . metoprolol tartrate (LOPRESSOR) 25 MG tablet Take 0.5 tablets (12.5 mg total) by mouth 2 (two) times daily. 30 tablet 5  . mirtazapine (REMERON) 7.5 MG tablet Take 7.5 mg by mouth at bedtime.    . nitroGLYCERIN (NITROSTAT) 0.4 MG SL tablet Place 1 tablet (0.4 mg total) under the tongue every 5 (five) minutes x 3 doses as needed for chest pain. 25 tablet 12  . Nutritional Supplements (ENSURE PO) Take 237 mLs by mouth 2 (two) times daily.     . Nutritional Supplements (FEEDING SUPPLEMENT, NEPRO CARB STEADY,) LIQD Take 237 mLs by mouth 2 (two) times daily between meals.  0  . ondansetron (ZOFRAN) 4 MG tablet Take 4 mg by mouth every 6 (six) hours as needed for nausea or vomiting.    . senna (SENOKOT) 8.6 MG TABS tablet Take 1 tablet by mouth daily.    . sevelamer carbonate (RENVELA) 800 MG tablet Take 1,600 mg by mouth 3 (three) times daily with meals.      No current facility-administered medications for this visit.      PHYSICAL EXAM: Vitals:   01/18/19 0921  BP: (!) 183/77  Pulse: 67  Resp: 14  Temp: (!) 97.3 F (36.3 C)  TempSrc: Oral  SpO2:  100%  Weight: 126 lb 1.7 oz (57.2 kg)  Height: 5' 10"  (1.778 m)    GENERAL: The patient is a well-nourished male, in no acute distress. The vital signs are documented above. Healing with his skin incision with excellent thrill in the fistula.  Sutures removed  MEDICAL ISSUES: Stable status post revision of eroded fistula.  Should not access the distal revision site until 01/30/2019.  Currently is being successfully accessed above the site with both needles.  Will see [place] as needed   Rosetta Posner, MD [facility] Tel [phone] Pager [phone]

## 2019-01-21 ENCOUNTER — Encounter: Payer: Self-pay | Admitting: Nephrology

## 2019-01-25 DIAGNOSIS — F4321 Adjustment disorder with depressed mood: Secondary | ICD-10-CM | POA: Diagnosis not present

## 2019-02-03 DIAGNOSIS — F4321 Adjustment disorder with depressed mood: Secondary | ICD-10-CM | POA: Diagnosis not present

## 2019-02-08 DIAGNOSIS — F4321 Adjustment disorder with depressed mood: Secondary | ICD-10-CM | POA: Diagnosis not present

## 2019-02-08 NOTE — Progress Notes (Deleted)
HPI: FU CAD. Carotid dopplers in Nov 2012 showed no significant stenosis bilaterally. Cardiac catheterization August 2015 showed normal LV function, 60% first diagonal, 50-70% LCx (FFR neg) , 50% RCA. CTA July 2018 showed 3.2 cm abdominal aortic aneurysm, 1.9 cm right and 1.4 cm left common iliac arteries.  The right SFA was occluded and there was stenosis in the left external iliac artery.  Severe stenosis or occlusion of the left SFA. Had NSTEMI 12/19.  Last echocardiogram December 2019 showed ejection fraction 45 to 50%, moderate left ventricular hypertrophy, mild diastolic dysfunction and mild left atrial enlargement.  Patient had attempt at cardiac catheterization December 2019 but access was unsuccessful from the left groin, right groin and right radial artery. Peripheral vascular disease followed by vascular surgery.  Since last seen   Current Outpatient Medications  Medication Sig Dispense Refill  . aspirin EC 81 MG tablet Take 1 tablet (81 mg total) by mouth daily. 90 tablet 3  . atorvastatin (LIPITOR) 80 MG tablet Take 1 tablet (80 mg total) by mouth at bedtime. 30 tablet 6  . bisacodyl (DULCOLAX) 10 MG suppository Place 10 mg rectally daily as needed (constipation).     . Cholecalciferol (D3-1000) 25 MCG (1000 UT) tablet Take 1,000 Units by mouth daily.    . cinacalcet (SENSIPAR) 90 MG tablet Take 90 mg by mouth daily with supper.    . clopidogrel (PLAVIX) 75 MG tablet Take 1 tablet (75 mg total) by mouth daily. 30 tablet 2  . diclofenac sodium (VOLTAREN) 1 % GEL Apply 2 g topically 4 (four) times daily as needed (PAIN IN LEFT FOOT).     Marland Kitchen docusate sodium (COLACE) 100 MG capsule Take 200 mg by mouth at bedtime.     Marland Kitchen doxercalciferol (HECTOROL) 4 MCG/2ML injection Inject 1 mL (2 mcg total) into the vein every Monday, Wednesday, and Friday with hemodialysis. 2 mL   . gabapentin (NEURONTIN) 300 MG capsule Take 1 capsule (300 mg total) by mouth at bedtime. 30 capsule 0  .  Glycopyrrolate (LONHALA MAGNAIR REFILL KIT) 25 MCG/ML SOLN Inhale 25 mcg into the lungs 2 (two) times daily.    Marland Kitchen HYDROcodone-acetaminophen (NORCO) 5-325 MG tablet Take 1 tablet by mouth every 6 (six) hours as needed for moderate pain. 12 tablet 0  . Icosapent Ethyl (VASCEPA) 1 g CAPS Take 1 g by mouth 2 (two) times daily.     Marland Kitchen ipratropium-albuterol (DUONEB) 0.5-2.5 (3) MG/3ML SOLN Take 3 mLs by nebulization every 8 (eight) hours as needed (for copd).    . isosorbide mononitrate (IMDUR) 30 MG 24 hr tablet Take 1 tablet (30 mg total) by mouth daily. 90 tablet 3  . metoprolol tartrate (LOPRESSOR) 25 MG tablet Take 0.5 tablets (12.5 mg total) by mouth 2 (two) times daily. 30 tablet 5  . mirtazapine (REMERON) 7.5 MG tablet Take 7.5 mg by mouth at bedtime.    . nitroGLYCERIN (NITROSTAT) 0.4 MG SL tablet Place 1 tablet (0.4 mg total) under the tongue every 5 (five) minutes x 3 doses as needed for chest pain. 25 tablet 12  . Nutritional Supplements (ENSURE PO) Take 237 mLs by mouth 2 (two) times daily.     . Nutritional Supplements (FEEDING SUPPLEMENT, NEPRO CARB STEADY,) LIQD Take 237 mLs by mouth 2 (two) times daily between meals.  0  . ondansetron (ZOFRAN) 4 MG tablet Take 4 mg by mouth every 6 (six) hours as needed for nausea or vomiting.    . senna (SENOKOT)  8.6 MG TABS tablet Take 1 tablet by mouth daily.    . sevelamer carbonate (RENVELA) 800 MG tablet Take 1,600 mg by mouth 3 (three) times daily with meals.      No current facility-administered medications for this visit.      Past Medical History:  Diagnosis Date  . AAA (abdominal aortic aneurysm) ([place])   . Anemia   . Anxiety   . Arthritis    arms and back  . Blindness   . CHF (congestive heart failure) ([place])   . Coronary artery disease    mild   . 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 ESRD on hemodialysis ([place])    started HD 2014-15  . Headache(784.0)   . HTN (hypertension)   .  Protein-calorie malnutrition ([place])   . STEMI (ST elevation myocardial infarction) ([place]) 11/11/2018  . Stroke [facility]) 01/2018   no residual deficits    Past Surgical History:  Procedure Laterality Date  . A/V FISTULAGRAM N/A 07/29/2018   Procedure: A/V FISTULAGRAM - left upper extremity;  Surgeon: Waynetta Sandy, MD;  Location: [place] [facility];  Service: Cardiovascular;  Laterality: N/A;  . AXILLARY-FEMORAL BYPASS GRAFT  06/28/2017   Procedure: BYPASS GRAFT RIGHT AXILLA-BIFEMORAL USING 8MM X 30CM AND 8MM X 60CM HEMASHIELD GOLD GRAFTS;  Surgeon: Rosetta Posner, MD;  Location: [place];  Service: Vascular;;  . CARDIAC CATHETERIZATION N/A 11/01/2016   Procedure: Left Heart Cath and Coronary Angiography;  Surgeon: Lorretta Harp, MD;  Location: [place] [facility];  Service: Cardiovascular;  Laterality: N/A;  . ENDARTERECTOMY FEMORAL Bilateral 06/28/2017   Procedure: ENDARTERECTOMY BILATERAL FEMORAL ARTERIES;  Surgeon: Rosetta Posner, MD;  Location: [place];  Service: Vascular;  Laterality: Bilateral;  . FISTULA SUPERFICIALIZATION Left 3/00/9233   Procedure: PLICATION OF ARTERIOVENOUS FISTULA LEFT ARM;  Surgeon: Waynetta Sandy, MD;  Location: [place];  Service: Vascular;  Laterality: Left;  . FISTULOGRAM Left 05/20/2018   Procedure: FISTULOGRAM WITH BALLOON ANGIOPLASTY LEFT ARM ARTERIOVENOUS FISTULA;  Surgeon: Waynetta Sandy, MD;  Location: [place];  Service: Vascular;  Laterality: Left;  . INSERTION OF DIALYSIS CATHETER  09/21/2012   Procedure: INSERTION OF DIALYSIS CATHETER;  Surgeon: Angelia Mould, MD;  Location: [place];  Service: Vascular;  Laterality: N/A;  Right Internal Jugular Placement  . LEFT HEART CATH N/A 11/11/2018   Procedure: LEFT HEART CATH;  Surgeon: Sherren Mocha, MD;  Location: [place] CV [facility];  Service: Cardiovascular;  Laterality: N/A;  . LEFT HEART CATHETERIZATION WITH CORONARY ANGIOGRAM N/A 09/28/2012   Procedure: LEFT HEART CATHETERIZATION  WITH CORONARY ANGIOGRAM;  Surgeon: Sherren Mocha, MD;  Location: [facility] CATH LAB;  Service: Cardiovascular;  Laterality: N/A;  . LEFT HEART CATHETERIZATION WITH CORONARY ANGIOGRAM N/A 07/07/2014   Procedure: LEFT HEART CATHETERIZATION WITH CORONARY ANGIOGRAM;  Surgeon: Leonie Man, MD;  Location: [facility] CATH LAB;  Service: Cardiovascular;  Laterality: N/A;  . PERIPHERAL VASCULAR BALLOON ANGIOPLASTY Left 07/29/2018   Procedure: PERIPHERAL VASCULAR BALLOON ANGIOPLASTY;  Surgeon: Waynetta Sandy, MD;  Location: [place] CV [facility];  Service: Cardiovascular;  Laterality: Left;  CENTRAL VEIN  . PERIPHERAL VASCULAR INTERVENTION Left 07/29/2018   Procedure: PERIPHERAL VASCULAR INTERVENTION;  Surgeon: Waynetta Sandy, MD;  Location: [place] CV [facility];  Service: Cardiovascular;  Laterality: Left;  AXILLARY VEIN  . REVISION OF ARTERIOVENOUS GORETEX GRAFT Left 12/30/2018   Procedure: REVISION OF ARTERIOVENOUS FISTULA LEFT ARM;  Surgeon: Rosetta Posner, MD;  Location: [place];  Service: Vascular;  Laterality: Left;  . REVISON OF ARTERIOVENOUS FISTULA Left 08/17/2018   Procedure: REVISION OF ARTERIOVENOUS FISTULA LEFT ARM;  Surgeon: Waynetta Sandy, MD;  Location: [place];  Service: Vascular;  Laterality: Left;  . right hand    . TEE WITHOUT CARDIOVERSION  10/09/2011   Procedure: TRANSESOPHAGEAL ECHOCARDIOGRAM (TEE);  Surgeon: Lelon Perla, MD;  Location: [facility] ENDOSCOPY;  Service: Cardiovascular;  Laterality: N/A;    Social History   Socioeconomic History  . Marital status: Widowed    Spouse name: Not on file  . Number of children: 2  . Years of education: Not on file  . Highest education level: Not on file  Occupational History  . Not on file  Social Needs  . Financial resource strain: Not on file  . Food insecurity:    Worry: Not on file    Inability: Not on file  . Transportation needs:    Medical: Not on file    Non-medical: Not on file  Tobacco Use  . Smoking status:  Current Every Day Smoker    Packs/day: 0.50    Years: 60.00    Pack years: 30.00    Types: Cigarettes  . Smokeless tobacco: Never Used  . Tobacco comment: 5-6 cigarettes per day  Substance and Sexual Activity  . Alcohol use: No    Alcohol/week: 0.0 standard drinks    Comment: Occasional  . Drug use: No    Frequency: 2.0 times per week    Types: "Crack" cocaine  . Sexual activity: Yes  Lifestyle  . Physical activity:    Days per week: Not on file    Minutes per session: Not on file  . Stress: Not on file  Relationships  . Social connections:    Talks on phone: Not on file    Gets together: Not on file    Attends religious service: Not on file    Active member of club or organization: Not on file    Attends meetings of clubs or organizations: Not on file    Relationship status: Not on file  . Intimate partner violence:    Fear of current or ex partner: Not on file    Emotionally abused: Not on file    Physically abused: Not on file    Forced sexual activity: Not on file  Other Topics Concern  . Not on file  Social History Narrative   Used to work in a Loss adjuster, chartered.     Family History  Problem Relation Age of Onset  . Heart attack Mother        MI in her 64s  . Diabetes Mother   . Alcohol abuse Father   . Anesthesia problems Neg Hx   . Hypotension Neg Hx   . Malignant hyperthermia Neg Hx   . Pseudochol deficiency Neg Hx     ROS: no fevers or chills, productive cough, hemoptysis, dysphasia, odynophagia, melena, hematochezia, dysuria, hematuria, rash, seizure activity, orthopnea, PND, pedal edema, claudication. Remaining systems are negative.  Physical Exam: Well-developed well-nourished in no acute distress.  Skin is warm and dry.  HEENT is normal.  Neck is supple.  Chest is clear to auscultation with normal expansion.  Cardiovascular exam is regular rate and rhythm.  Abdominal exam nontender or distended. No masses palpated. Extremities show no edema. neuro  grossly intact  ECG- personally reviewed  A/P  1  Kirk Ruths, MD

## 2019-02-10 DIAGNOSIS — M25552 Pain in left hip: Secondary | ICD-10-CM | POA: Diagnosis not present

## 2019-02-10 DIAGNOSIS — S91209A Unspecified open wound of unspecified toe(s) with damage to nail, initial encounter: Secondary | ICD-10-CM | POA: Diagnosis not present

## 2019-02-10 DIAGNOSIS — D631 Anemia in chronic kidney disease: Secondary | ICD-10-CM | POA: Diagnosis not present

## 2019-02-10 DIAGNOSIS — M79672 Pain in left foot: Secondary | ICD-10-CM | POA: Diagnosis not present

## 2019-02-15 ENCOUNTER — Ambulatory Visit: Payer: Medicare Other | Admitting: Cardiology

## 2019-02-15 DIAGNOSIS — F4321 Adjustment disorder with depressed mood: Secondary | ICD-10-CM | POA: Diagnosis not present

## 2019-02-17 DIAGNOSIS — F4320 Adjustment disorder, unspecified: Secondary | ICD-10-CM | POA: Diagnosis not present

## 2019-02-21 DIAGNOSIS — I739 Peripheral vascular disease, unspecified: Secondary | ICD-10-CM | POA: Diagnosis not present

## 2019-02-22 DIAGNOSIS — L97408 Non-pressure chronic ulcer of unspecified heel and midfoot with other specified severity: Secondary | ICD-10-CM | POA: Diagnosis not present

## 2019-02-22 DIAGNOSIS — N186 End stage renal disease: Secondary | ICD-10-CM | POA: Diagnosis not present

## 2019-02-22 DIAGNOSIS — Z992 Dependence on renal dialysis: Secondary | ICD-10-CM | POA: Diagnosis not present

## 2019-02-22 DIAGNOSIS — I7389 Other specified peripheral vascular diseases: Secondary | ICD-10-CM | POA: Diagnosis not present

## 2019-02-28 ENCOUNTER — Other Ambulatory Visit: Payer: Self-pay

## 2019-02-28 ENCOUNTER — Emergency Department (HOSPITAL_COMMUNITY): Payer: Medicare Other

## 2019-02-28 ENCOUNTER — Inpatient Hospital Stay (HOSPITAL_COMMUNITY)
Admission: EM | Admit: 2019-02-28 | Discharge: 2019-03-06 | DRG: 853 | Disposition: A | Discharge status: Skilled Nursing Facility | Payer: Medicare Other | Source: Skilled Nursing Facility | Attending: Internal Medicine | Admitting: Internal Medicine

## 2019-02-28 ENCOUNTER — Encounter (HOSPITAL_COMMUNITY): Payer: Self-pay

## 2019-02-28 DIAGNOSIS — Z09 Encounter for follow-up examination after completed treatment for conditions other than malignant neoplasm: Secondary | ICD-10-CM

## 2019-02-28 DIAGNOSIS — I5042 Chronic combined systolic (congestive) and diastolic (congestive) heart failure: Secondary | ICD-10-CM | POA: Diagnosis present

## 2019-02-28 DIAGNOSIS — M85872 Other specified disorders of bone density and structure, left ankle and foot: Secondary | ICD-10-CM | POA: Diagnosis not present

## 2019-02-28 DIAGNOSIS — E1122 Type 2 diabetes mellitus with diabetic chronic kidney disease: Secondary | ICD-10-CM | POA: Diagnosis present

## 2019-02-28 DIAGNOSIS — R195 Other fecal abnormalities: Secondary | ICD-10-CM | POA: Diagnosis present

## 2019-02-28 DIAGNOSIS — H547 Unspecified visual loss: Secondary | ICD-10-CM | POA: Diagnosis present

## 2019-02-28 DIAGNOSIS — I491 Atrial premature depolarization: Secondary | ICD-10-CM | POA: Diagnosis not present

## 2019-02-28 DIAGNOSIS — L03032 Cellulitis of left toe: Secondary | ICD-10-CM

## 2019-02-28 DIAGNOSIS — N186 End stage renal disease: Secondary | ICD-10-CM | POA: Diagnosis present

## 2019-02-28 DIAGNOSIS — J189 Pneumonia, unspecified organism: Secondary | ICD-10-CM | POA: Diagnosis not present

## 2019-02-28 DIAGNOSIS — D631 Anemia in chronic kidney disease: Secondary | ICD-10-CM

## 2019-02-28 DIAGNOSIS — R918 Other nonspecific abnormal finding of lung field: Secondary | ICD-10-CM

## 2019-02-28 DIAGNOSIS — E860 Dehydration: Secondary | ICD-10-CM | POA: Diagnosis present

## 2019-02-28 DIAGNOSIS — I2510 Atherosclerotic heart disease of native coronary artery without angina pectoris: Secondary | ICD-10-CM | POA: Diagnosis present

## 2019-02-28 DIAGNOSIS — F1411 Cocaine abuse, in remission: Secondary | ICD-10-CM | POA: Diagnosis present

## 2019-02-28 DIAGNOSIS — Z681 Body mass index (BMI) 19 or less, adult: Secondary | ICD-10-CM | POA: Diagnosis not present

## 2019-02-28 DIAGNOSIS — N400 Enlarged prostate without lower urinary tract symptoms: Secondary | ICD-10-CM | POA: Diagnosis present

## 2019-02-28 DIAGNOSIS — Z720 Tobacco use: Secondary | ICD-10-CM

## 2019-02-28 DIAGNOSIS — I96 Gangrene, not elsewhere classified: Secondary | ICD-10-CM

## 2019-02-28 DIAGNOSIS — Z20828 Contact with and (suspected) exposure to other viral communicable diseases: Secondary | ICD-10-CM | POA: Diagnosis present

## 2019-02-28 DIAGNOSIS — J9600 Acute respiratory failure, unspecified whether with hypoxia or hypercapnia: Secondary | ICD-10-CM | POA: Diagnosis not present

## 2019-02-28 DIAGNOSIS — J9601 Acute respiratory failure with hypoxia: Secondary | ICD-10-CM | POA: Diagnosis present

## 2019-02-28 DIAGNOSIS — D62 Acute posthemorrhagic anemia: Secondary | ICD-10-CM | POA: Diagnosis not present

## 2019-02-28 DIAGNOSIS — Z7902 Long term (current) use of antithrombotics/antiplatelets: Secondary | ICD-10-CM

## 2019-02-28 DIAGNOSIS — J440 Chronic obstructive pulmonary disease with acute lower respiratory infection: Secondary | ICD-10-CM | POA: Diagnosis present

## 2019-02-28 DIAGNOSIS — Z8673 Personal history of transient ischemic attack (TIA), and cerebral infarction without residual deficits: Secondary | ICD-10-CM | POA: Diagnosis not present

## 2019-02-28 DIAGNOSIS — R64 Cachexia: Secondary | ICD-10-CM | POA: Diagnosis present

## 2019-02-28 DIAGNOSIS — M6281 Muscle weakness (generalized): Secondary | ICD-10-CM | POA: Diagnosis present

## 2019-02-28 DIAGNOSIS — R0902 Hypoxemia: Secondary | ICD-10-CM | POA: Diagnosis not present

## 2019-02-28 DIAGNOSIS — F17210 Nicotine dependence, cigarettes, uncomplicated: Secondary | ICD-10-CM | POA: Diagnosis present

## 2019-02-28 DIAGNOSIS — E1151 Type 2 diabetes mellitus with diabetic peripheral angiopathy without gangrene: Secondary | ICD-10-CM | POA: Diagnosis present

## 2019-02-28 DIAGNOSIS — Z833 Family history of diabetes mellitus: Secondary | ICD-10-CM

## 2019-02-28 DIAGNOSIS — I252 Old myocardial infarction: Secondary | ICD-10-CM

## 2019-02-28 DIAGNOSIS — Z79890 Hormone replacement therapy (postmenopausal): Secondary | ICD-10-CM

## 2019-02-28 DIAGNOSIS — Z89612 Acquired absence of left leg above knee: Secondary | ICD-10-CM | POA: Diagnosis not present

## 2019-02-28 DIAGNOSIS — L0390 Cellulitis, unspecified: Secondary | ICD-10-CM | POA: Diagnosis present

## 2019-02-28 DIAGNOSIS — I2584 Coronary atherosclerosis due to calcified coronary lesion: Secondary | ICD-10-CM | POA: Diagnosis present

## 2019-02-28 DIAGNOSIS — Z4781 Encounter for orthopedic aftercare following surgical amputation: Secondary | ICD-10-CM | POA: Diagnosis not present

## 2019-02-28 DIAGNOSIS — M479 Spondylosis, unspecified: Secondary | ICD-10-CM | POA: Diagnosis present

## 2019-02-28 DIAGNOSIS — E785 Hyperlipidemia, unspecified: Secondary | ICD-10-CM | POA: Diagnosis present

## 2019-02-28 DIAGNOSIS — E1165 Type 2 diabetes mellitus with hyperglycemia: Secondary | ICD-10-CM | POA: Diagnosis present

## 2019-02-28 DIAGNOSIS — Z95820 Peripheral vascular angioplasty status with implants and grafts: Secondary | ICD-10-CM | POA: Diagnosis not present

## 2019-02-28 DIAGNOSIS — J449 Chronic obstructive pulmonary disease, unspecified: Secondary | ICD-10-CM | POA: Diagnosis present

## 2019-02-28 DIAGNOSIS — R9389 Abnormal findings on diagnostic imaging of other specified body structures: Secondary | ICD-10-CM | POA: Diagnosis present

## 2019-02-28 DIAGNOSIS — J309 Allergic rhinitis, unspecified: Secondary | ICD-10-CM | POA: Diagnosis present

## 2019-02-28 DIAGNOSIS — R778 Other specified abnormalities of plasma proteins: Secondary | ICD-10-CM

## 2019-02-28 DIAGNOSIS — I639 Cerebral infarction, unspecified: Secondary | ICD-10-CM | POA: Diagnosis present

## 2019-02-28 DIAGNOSIS — I120 Hypertensive chronic kidney disease with stage 5 chronic kidney disease or end stage renal disease: Secondary | ICD-10-CM | POA: Diagnosis not present

## 2019-02-28 DIAGNOSIS — G4709 Other insomnia: Secondary | ICD-10-CM | POA: Diagnosis present

## 2019-02-28 DIAGNOSIS — F0390 Unspecified dementia without behavioral disturbance: Secondary | ICD-10-CM | POA: Diagnosis present

## 2019-02-28 DIAGNOSIS — N189 Chronic kidney disease, unspecified: Secondary | ICD-10-CM

## 2019-02-28 DIAGNOSIS — A419 Sepsis, unspecified organism: Secondary | ICD-10-CM | POA: Diagnosis not present

## 2019-02-28 DIAGNOSIS — I509 Heart failure, unspecified: Secondary | ICD-10-CM

## 2019-02-28 DIAGNOSIS — R0602 Shortness of breath: Secondary | ICD-10-CM | POA: Diagnosis not present

## 2019-02-28 DIAGNOSIS — I70262 Atherosclerosis of native arteries of extremities with gangrene, left leg: Secondary | ICD-10-CM | POA: Diagnosis not present

## 2019-02-28 DIAGNOSIS — R7989 Other specified abnormal findings of blood chemistry: Secondary | ICD-10-CM

## 2019-02-28 DIAGNOSIS — E1152 Type 2 diabetes mellitus with diabetic peripheral angiopathy with gangrene: Secondary | ICD-10-CM | POA: Diagnosis present

## 2019-02-28 DIAGNOSIS — D72821 Monocytosis (symptomatic): Secondary | ICD-10-CM | POA: Diagnosis present

## 2019-02-28 DIAGNOSIS — H543 Unqualified visual loss, both eyes: Secondary | ICD-10-CM | POA: Diagnosis present

## 2019-02-28 DIAGNOSIS — Z7982 Long term (current) use of aspirin: Secondary | ICD-10-CM

## 2019-02-28 DIAGNOSIS — Z992 Dependence on renal dialysis: Secondary | ICD-10-CM | POA: Diagnosis not present

## 2019-02-28 DIAGNOSIS — R0689 Other abnormalities of breathing: Secondary | ICD-10-CM | POA: Diagnosis not present

## 2019-02-28 DIAGNOSIS — Z7401 Bed confinement status: Secondary | ICD-10-CM | POA: Diagnosis not present

## 2019-02-28 DIAGNOSIS — M2550 Pain in unspecified joint: Secondary | ICD-10-CM | POA: Diagnosis not present

## 2019-02-28 DIAGNOSIS — I714 Abdominal aortic aneurysm, without rupture: Secondary | ICD-10-CM | POA: Diagnosis present

## 2019-02-28 DIAGNOSIS — I10 Essential (primary) hypertension: Secondary | ICD-10-CM | POA: Diagnosis not present

## 2019-02-28 DIAGNOSIS — I132 Hypertensive heart and chronic kidney disease with heart failure and with stage 5 chronic kidney disease, or end stage renal disease: Secondary | ICD-10-CM | POA: Diagnosis present

## 2019-02-28 DIAGNOSIS — I69298 Other sequelae of other nontraumatic intracranial hemorrhage: Secondary | ICD-10-CM

## 2019-02-28 DIAGNOSIS — F419 Anxiety disorder, unspecified: Secondary | ICD-10-CM | POA: Diagnosis present

## 2019-02-28 DIAGNOSIS — R41841 Cognitive communication deficit: Secondary | ICD-10-CM | POA: Diagnosis present

## 2019-02-28 DIAGNOSIS — Z8249 Family history of ischemic heart disease and other diseases of the circulatory system: Secondary | ICD-10-CM

## 2019-02-28 DIAGNOSIS — F1210 Cannabis abuse, uncomplicated: Secondary | ICD-10-CM | POA: Diagnosis present

## 2019-02-28 DIAGNOSIS — Z79899 Other long term (current) drug therapy: Secondary | ICD-10-CM

## 2019-02-28 DIAGNOSIS — I214 Non-ST elevation (NSTEMI) myocardial infarction: Secondary | ICD-10-CM | POA: Diagnosis present

## 2019-02-28 DIAGNOSIS — R000 Tachycardia, unspecified: Secondary | ICD-10-CM | POA: Diagnosis not present

## 2019-02-28 LAB — CBC WITH DIFFERENTIAL/PLATELET
Abs Immature Granulocytes: 0.14 10*3/uL — ABNORMAL HIGH (ref 0.00–0.07)
Basophils Absolute: 0.1 10*3/uL (ref 0.0–0.1)
Basophils Relative: 1 %
Eosinophils Absolute: 0.1 10*3/uL (ref 0.0–0.5)
Eosinophils Relative: 1 %
HEMATOCRIT: 36.8 % — AB (ref 39.0–52.0)
Hemoglobin: 10.9 g/dL — ABNORMAL LOW (ref 13.0–17.0)
Immature Granulocytes: 1 %
LYMPHS ABS: 1.6 10*3/uL (ref 0.7–4.0)
LYMPHS PCT: 11 %
MCH: 27 pg (ref 26.0–34.0)
MCHC: 29.6 g/dL — ABNORMAL LOW (ref 30.0–36.0)
MCV: 91.1 fL (ref 80.0–100.0)
Monocytes Absolute: 0.9 10*3/uL (ref 0.1–1.0)
Monocytes Relative: 7 %
NEUTROS ABS: 11.2 10*3/uL — AB (ref 1.7–7.7)
Neutrophils Relative %: 79 %
Platelets: 236 10*3/uL (ref 150–400)
RBC: 4.04 MIL/uL — ABNORMAL LOW (ref 4.22–5.81)
RDW: 15.2 % (ref 11.5–15.5)
WBC: 14 10*3/uL — ABNORMAL HIGH (ref 4.0–10.5)
nRBC: 0 % (ref 0.0–0.2)

## 2019-02-28 LAB — COMPREHENSIVE METABOLIC PANEL
ALT: 13 U/L (ref 0–44)
AST: 27 U/L (ref 15–41)
Albumin: 2.6 g/dL — ABNORMAL LOW (ref 3.5–5.0)
Alkaline Phosphatase: 87 U/L (ref 38–126)
Anion gap: 20 — ABNORMAL HIGH (ref 5–15)
BUN: 47 mg/dL — ABNORMAL HIGH (ref 8–23)
CO2: 25 mmol/L (ref 22–32)
Calcium: 9.6 mg/dL (ref 8.9–10.3)
Chloride: 94 mmol/L — ABNORMAL LOW (ref 98–111)
Creatinine, Ser: 9.15 mg/dL — ABNORMAL HIGH (ref 0.61–1.24)
GFR calc Af Amer: 6 mL/min — ABNORMAL LOW (ref >60)
GFR calc non Af Amer: 5 mL/min — ABNORMAL LOW (ref >60)
Glucose, Bld: 123 mg/dL — ABNORMAL HIGH (ref 70–99)
Potassium: 5.1 mmol/L (ref 3.5–5.1)
Sodium: 139 mmol/L (ref 135–145)
Total Bilirubin: 1 mg/dL (ref 0.3–1.2)
Total Protein: 7.7 g/dL (ref 6.5–8.1)

## 2019-02-28 LAB — LACTIC ACID, PLASMA
Lactic Acid, Venous: 4.8 mmol/L (ref 0.5–1.9)
Lactic Acid, Venous: 1.7 mmol/L (ref 0.5–1.9)

## 2019-02-28 LAB — TROPONIN I: Troponin I: 0.2 ng/mL (ref <0.03)

## 2019-02-28 MED ORDER — ALTEPLASE 2 MG IJ SOLR
2.0000 mg | Freq: Once | INTRAMUSCULAR | Status: DC | PRN
  Filled 2019-02-28: qty 2

## 2019-02-28 MED ORDER — UMECLIDINIUM BROMIDE 62.5 MCG/INH IN AEPB
1.0000 | INHALATION_SPRAY | Freq: Every day | RESPIRATORY_TRACT | Status: DC
  Administered 2019-03-01 – 2019-03-02 (×2): 1 via RESPIRATORY_TRACT
  Filled 2019-02-28 (×2): qty 7

## 2019-02-28 MED ORDER — SODIUM CHLORIDE 0.9 % IV BOLUS (SEPSIS)
250.0000 mL | Freq: Once | INTRAVENOUS | Status: AC
  Administered 2019-02-28: 250 mL via INTRAVENOUS

## 2019-02-28 MED ORDER — ASPIRIN EC 81 MG PO TBEC
81.0000 mg | DELAYED_RELEASE_TABLET | Freq: Every day | ORAL | Status: DC
  Administered 2019-02-28 – 2019-03-06 (×6): 81 mg via ORAL
  Filled 2019-02-28 (×6): qty 1

## 2019-02-28 MED ORDER — PENTAFLUOROPROP-TETRAFLUOROETH EX AERO: 1.0000 "application " | INHALATION_SPRAY | CUTANEOUS | Status: DC | PRN

## 2019-02-28 MED ORDER — SODIUM CHLORIDE 0.9 % IV BOLUS (SEPSIS)
500.0000 mL | Freq: Once | INTRAVENOUS | Status: AC
  Administered 2019-02-28: 500 mL via INTRAVENOUS

## 2019-02-28 MED ORDER — HEPARIN SODIUM (PORCINE) 5000 UNIT/ML IJ SOLN
5000.0000 [IU] | Freq: Three times a day (TID) | INTRAMUSCULAR | Status: DC
  Administered 2019-02-28 – 2019-03-03 (×7): 5000 [IU] via SUBCUTANEOUS
  Filled 2019-02-28 (×8): qty 1

## 2019-02-28 MED ORDER — CINACALCET HCL 30 MG PO TABS
90.0000 mg | ORAL_TABLET | Freq: Every day | ORAL | Status: DC
  Administered 2019-02-28 – 2019-03-05 (×6): 90 mg via ORAL
  Filled 2019-02-28 (×6): qty 3

## 2019-02-28 MED ORDER — SODIUM CHLORIDE 0.9 % IV BOLUS (SEPSIS)
1000.0000 mL | Freq: Once | INTRAVENOUS | Status: AC
  Administered 2019-02-28: 1000 mL via INTRAVENOUS

## 2019-02-28 MED ORDER — CHLORHEXIDINE GLUCONATE CLOTH 2 % EX PADS
6.0000 | MEDICATED_PAD | Freq: Every day | CUTANEOUS | Status: DC
  Administered 2019-02-28 – 2019-03-03 (×3): 6 via TOPICAL

## 2019-02-28 MED ORDER — TRAMADOL HCL 50 MG PO TABS
50.0000 mg | ORAL_TABLET | Freq: Two times a day (BID) | ORAL | Status: DC | PRN
  Administered 2019-02-28: 50 mg via ORAL
  Filled 2019-02-28: qty 1

## 2019-02-28 MED ORDER — OXYCODONE HCL 5 MG PO TABS
5.0000 mg | ORAL_TABLET | Freq: Four times a day (QID) | ORAL | Status: DC | PRN
  Administered 2019-03-02: 23:00:00 5 mg via ORAL
  Filled 2019-02-28 (×2): qty 1

## 2019-02-28 MED ORDER — GABAPENTIN 300 MG PO CAPS
300.0000 mg | ORAL_CAPSULE | Freq: Every day | ORAL | Status: DC
  Administered 2019-02-28 – 2019-03-05 (×6): 300 mg via ORAL
  Filled 2019-02-28 (×7): qty 1

## 2019-02-28 MED ORDER — DOXERCALCIFEROL 4 MCG/2ML IV SOLN
2.0000 ug | INTRAVENOUS | Status: DC
  Administered 2019-03-02 – 2019-03-04 (×2): 2 ug via INTRAVENOUS
  Filled 2019-02-28 (×2): qty 2

## 2019-02-28 MED ORDER — LIDOCAINE HCL (PF) 1 % IJ SOLN
5.0000 mL | INTRAMUSCULAR | Status: DC | PRN
  Filled 2019-02-28: qty 5

## 2019-02-28 MED ORDER — CLOPIDOGREL BISULFATE 75 MG PO TABS
75.0000 mg | ORAL_TABLET | Freq: Every day | ORAL | Status: DC
  Administered 2019-02-28 – 2019-03-06 (×6): 75 mg via ORAL
  Filled 2019-02-28 (×6): qty 1

## 2019-02-28 MED ORDER — NEPRO/CARBSTEADY PO LIQD
237.0000 mL | Freq: Two times a day (BID) | ORAL | Status: DC
  Administered 2019-02-28 – 2019-03-06 (×10): 237 mL via ORAL
  Filled 2019-02-28 (×5): qty 237

## 2019-02-28 MED ORDER — SODIUM CHLORIDE 0.9 % IV SOLN: 100.0000 mL | INTRAVENOUS | Status: DC | PRN

## 2019-02-28 MED ORDER — ENSURE ENLIVE PO LIQD
237.0000 mL | Freq: Three times a day (TID) | ORAL | Status: DC
  Administered 2019-03-01: 237 mL via ORAL

## 2019-02-28 MED ORDER — VANCOMYCIN HCL 10 G IV SOLR
1250.0000 mg | Freq: Once | INTRAVENOUS | Status: AC
  Administered 2019-02-28: 1250 mg via INTRAVENOUS
  Filled 2019-02-28: qty 1250

## 2019-02-28 MED ORDER — METRONIDAZOLE IN NACL 5-0.79 MG/ML-% IV SOLN
500.0000 mg | Freq: Once | INTRAVENOUS | Status: AC
  Administered 2019-02-28: 500 mg via INTRAVENOUS
  Filled 2019-02-28: qty 100

## 2019-02-28 MED ORDER — VANCOMYCIN HCL IN DEXTROSE 500-5 MG/100ML-% IV SOLN
500.0000 mg | INTRAVENOUS | Status: DC
  Administered 2019-02-28 – 2019-03-04 (×3): 500 mg via INTRAVENOUS
  Filled 2019-02-28 (×8): qty 100

## 2019-02-28 MED ORDER — VANCOMYCIN HCL IN DEXTROSE 1-5 GM/200ML-% IV SOLN
1000.0000 mg | Freq: Once | INTRAVENOUS | Status: DC
  Filled 2019-02-28: qty 200

## 2019-02-28 MED ORDER — SODIUM CHLORIDE 0.9 % IV SOLN
2.0000 g | Freq: Once | INTRAVENOUS | Status: AC
  Administered 2019-02-28: 2 g via INTRAVENOUS
  Filled 2019-02-28: qty 2

## 2019-02-28 MED ORDER — SODIUM CHLORIDE 0.9 % IV SOLN
1.0000 g | INTRAVENOUS | Status: DC
  Administered 2019-02-28 – 2019-03-05 (×6): 1 g via INTRAVENOUS
  Filled 2019-02-28 (×9): qty 1

## 2019-02-28 MED ORDER — IPRATROPIUM-ALBUTEROL 0.5-2.5 (3) MG/3ML IN SOLN: 3.0000 mL | Freq: Three times a day (TID) | RESPIRATORY_TRACT | Status: DC | PRN

## 2019-02-28 MED ORDER — LIDOCAINE-PRILOCAINE 2.5-2.5 % EX CREA
1.0000 "application " | TOPICAL_CREAM | CUTANEOUS | Status: DC | PRN
  Filled 2019-02-28: qty 5

## 2019-02-28 MED ORDER — SEVELAMER CARBONATE 800 MG PO TABS
1600.0000 mg | ORAL_TABLET | Freq: Three times a day (TID) | ORAL | Status: DC
  Administered 2019-02-28 – 2019-03-06 (×15): 1600 mg via ORAL
  Filled 2019-02-28 (×18): qty 2

## 2019-02-28 MED ORDER — IPRATROPIUM-ALBUTEROL 20-100 MCG/ACT IN AERS
1.0000 | INHALATION_SPRAY | Freq: Three times a day (TID) | RESPIRATORY_TRACT | Status: DC | PRN
  Filled 2019-02-28: qty 4

## 2019-02-28 MED ORDER — GLYCOPYRROLATE 25 MCG/ML IN SOLN: 25.0000 ug | Freq: Two times a day (BID) | RESPIRATORY_TRACT | Status: DC

## 2019-02-28 MED ORDER — ACETAMINOPHEN 325 MG PO TABS
650.0000 mg | ORAL_TABLET | Freq: Once | ORAL | Status: AC
  Administered 2019-02-28: 650 mg via ORAL
  Filled 2019-02-28: qty 2

## 2019-02-28 NOTE — Procedures (Signed)
   I was present at this dialysis session, have reviewed the session itself and made  appropriate changes Kelly Splinter MD [place] pager [phone]   02/28/2019, 9:38 AM

## 2019-02-28 NOTE — ED Notes (Signed)
Unable to obtain second set of blood cultures before starting antibiotics. Dialysis patient and very difficult stick.

## 2019-02-28 NOTE — Consult Note (Signed)
Hospital Consult    Reason for Consult:  Ischemic foot Requesting Physician:  Verlon Au  MRN #:  [id number]  History of Present Illness: This is a 73 y.o. male who had one month history of left 5th toe slowly turning dark.  This has progressed over the past few weeks with increasing pain and now dark areas on all 5 toes.  He presented to the ED today with acute respiratory distress.   He had c/o sob, fever of 101 in ED and cough.  He is currently on precautions for COVID19.  The pt has been a [name] resident for over a year.  He is not ambulatory.  He has had no sick contacts.  He did c/o chest tightness and sob earlier.  Pt found to have venous lactic acid of 4.8 on arrival and down to 1.7, leukocytosis of 14k.   He has hx of abdominal u/s revealing 2.6 x 2.7 cm AAA and right CIA with mild dilatation measuring 1.8 x 1.7cm.  Pt has had a axillary to bifemoral bypass by Dr. Donnetta Hutching in 2018.    Pt had STEMI in December 2019 and he had unsuccessful attempts at cardiac catheterization from left and right groin as well as right radial artery.  Pt with severe PAD with previous ax-bifem bypass grafting and severe diffuse atherosclerosis with left arm HD access.  He has hx of ESRD.  On 07/29/18. he underwent balloon angioplasty & stent of left SCV by Dr. Donzetta Matters on 07/29/18.    He underwent revision of LUA AVF with plication 9/81/19.  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    He is a [employer] vet.    The pt is on a statin for cholesterol management.  The pt is on a daily aspirin.   Other AC:  Plavix The pt is not on medication for hypertension.   The pt is not diabetic.   Tobacco hx:  Current; 1/2 ppd for 60 years  Past Medical History:  Diagnosis Date  . AAA (abdominal aortic aneurysm) ([place])   . Anemia   . Anxiety   . Arthritis    arms and back  . Blindness   . CHF (congestive heart failure) ([place])   .  Coronary artery disease    mild   . 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 ESRD on hemodialysis ([place])    started HD 2014-15  . Headache(784.0)   . HTN (hypertension)   . Protein-calorie malnutrition ([place])   . STEMI (ST elevation myocardial infarction) ([place]) 11/11/2018  . Stroke [facility]) 01/2018   no residual deficits    Past Surgical History:  Procedure Laterality Date  . A/V FISTULAGRAM N/A 07/29/2018   Procedure: A/V FISTULAGRAM - left upper extremity;  Surgeon: Waynetta Sandy, MD;  Location: [place] CV [facility];  Service: Cardiovascular;  Laterality: N/A;  . AXILLARY-FEMORAL BYPASS GRAFT  06/28/2017   Procedure: BYPASS GRAFT RIGHT AXILLA-BIFEMORAL USING 8MM X 30CM AND 8MM X 60CM HEMASHIELD GOLD GRAFTS;  Surgeon: Rosetta Posner, MD;  Location: [place];  Service: Vascular;;  . CARDIAC CATHETERIZATION N/A 11/01/2016   Procedure: Left Heart Cath and Coronary Angiography;  Surgeon: Lorretta Harp, MD;  Location: [place] [facility];  Service: Cardiovascular;  Laterality: N/A;  . ENDARTERECTOMY FEMORAL Bilateral 06/28/2017   Procedure: ENDARTERECTOMY BILATERAL FEMORAL ARTERIES;  Surgeon:  Rosetta Posner, MD;  Location: [facility] OR;  Service: Vascular;  Laterality: Bilateral;  . FISTULA SUPERFICIALIZATION Left 6/94/5038   Procedure: PLICATION OF ARTERIOVENOUS FISTULA LEFT ARM;  Surgeon: Waynetta Sandy, MD;  Location: [place];  Service: Vascular;  Laterality: Left;  . FISTULOGRAM Left 05/20/2018   Procedure: FISTULOGRAM WITH BALLOON ANGIOPLASTY LEFT ARM ARTERIOVENOUS FISTULA;  Surgeon: Waynetta Sandy, MD;  Location: [place];  Service: Vascular;  Laterality: Left;  . INSERTION OF DIALYSIS CATHETER  09/21/2012   Procedure: INSERTION OF DIALYSIS CATHETER;  Surgeon: Angelia Mould, MD;  Location: [place];  Service: Vascular;  Laterality: N/A;  Right Internal Jugular Placement  . LEFT HEART CATH N/A 11/11/2018   Procedure: LEFT HEART  CATH;  Surgeon: Sherren Mocha, MD;  Location: [place] CV LAB;  Service: Cardiovascular;  Laterality: N/A;  . LEFT HEART CATHETERIZATION WITH CORONARY ANGIOGRAM N/A 09/28/2012   Procedure: LEFT HEART CATHETERIZATION WITH CORONARY ANGIOGRAM;  Surgeon: Sherren Mocha, MD;  Location: [facility] CATH LAB;  Service: Cardiovascular;  Laterality: N/A;  . LEFT HEART CATHETERIZATION WITH CORONARY ANGIOGRAM N/A 07/07/2014   Procedure: LEFT HEART CATHETERIZATION WITH CORONARY ANGIOGRAM;  Surgeon: Leonie Man, MD;  Location: [facility] CATH LAB;  Service: Cardiovascular;  Laterality: N/A;  . PERIPHERAL VASCULAR BALLOON ANGIOPLASTY Left 07/29/2018   Procedure: PERIPHERAL VASCULAR BALLOON ANGIOPLASTY;  Surgeon: Waynetta Sandy, MD;  Location: [place] [facility];  Service: Cardiovascular;  Laterality: Left;  CENTRAL VEIN  . PERIPHERAL VASCULAR INTERVENTION Left 07/29/2018   Procedure: PERIPHERAL VASCULAR INTERVENTION;  Surgeon: Waynetta Sandy, MD;  Location: [place] CV [facility];  Service: Cardiovascular;  Laterality: Left;  AXILLARY VEIN  . REVISION OF ARTERIOVENOUS GORETEX GRAFT Left 12/30/2018   Procedure: REVISION OF ARTERIOVENOUS FISTULA LEFT ARM;  Surgeon: Rosetta Posner, MD;  Location: [place];  Service: Vascular;  Laterality: Left;  . REVISON OF ARTERIOVENOUS FISTULA Left 08/17/2018   Procedure: REVISION OF ARTERIOVENOUS FISTULA LEFT ARM;  Surgeon: Waynetta Sandy, MD;  Location: [place];  Service: Vascular;  Laterality: Left;  . right hand    . TEE WITHOUT CARDIOVERSION  10/09/2011   Procedure: TRANSESOPHAGEAL ECHOCARDIOGRAM (TEE);  Surgeon: Lelon Perla, MD;  Location[facility] ENDOSCOPY;  Service: Cardiovascular;  Laterality: N/A;    No Known Allergies  Prior to Admission medications   Medication Sig Start Date End Date Taking? Authorizing Provider  aspirin EC 81 MG tablet Take 1 tablet (81 mg total) by mouth daily. 11/15/18   Bhagat, Crista Luria, PA  atorvastatin (LIPITOR) 80 MG tablet  Take 1 tablet (80 mg total) by mouth at bedtime. 11/15/18   Bhagat, Crista Luria, PA  bisacodyl (DULCOLAX) 10 MG suppository Place 10 mg rectally daily as needed (constipation).     [provider]  Cholecalciferol (D3-1000) 25 MCG (1000 UT) tablet Take 1,000 Units by mouth daily.    [provider]  cinacalcet (SENSIPAR) 90 MG tablet Take 90 mg by mouth daily with supper.    [provider]  clopidogrel (PLAVIX) 75 MG tablet Take 1 tablet (75 mg total) by mouth daily. 04/05/15   Barton Dubois, MD  diclofenac sodium (VOLTAREN) 1 % GEL Apply 2 g topically 4 (four) times daily as needed (PAIN IN LEFT FOOT).     [provider]  docusate sodium (COLACE) 100 MG capsule Take 200 mg by mouth at bedtime.     [provider]  doxercalciferol (HECTOROL) 4 MCG/2ML injection Inject 1 mL (2 mcg total) into the vein every  Monday, Wednesday, and Friday with hemodialysis. 01/15/18   Colbert Ewing, MD  gabapentin (NEURONTIN) 300 MG capsule Take 1 capsule (300 mg total) by mouth at bedtime. 01/14/18   Colbert Ewing, MD  Glycopyrrolate [facility] REFILL KIT) 25 MCG/ML SOLN Inhale 25 mcg into the lungs 2 (two) times daily.    [provider]  HYDROcodone-acetaminophen (NORCO) 5-325 MG tablet Take 1 tablet by mouth every 6 (six) hours as needed for moderate pain. 08/17/18   Dagoberto Ligas, PA-C  Icosapent Ethyl (VASCEPA) 1 g CAPS Take 1 g by mouth 2 (two) times daily.     [provider]  ipratropium-albuterol (DUONEB) 0.5-2.5 (3) MG/3ML SOLN Take 3 mLs by nebulization every 8 (eight) hours as needed (for copd).    [provider]  isosorbide mononitrate (IMDUR) 30 MG 24 hr tablet Take 1 tablet (30 mg total) by mouth daily. 11/22/18 02/20/19  Erlene Quan, PA-C  metoprolol tartrate (LOPRESSOR) 25 MG tablet Take 0.5 tablets (12.5 mg total) by mouth 2 (two) times daily. 11/15/18   Bhagat, Crista Luria, PA  mirtazapine (REMERON) 7.5 MG tablet  Take 7.5 mg by mouth at bedtime.    [provider]  nitroGLYCERIN (NITROSTAT) 0.4 MG SL tablet Place 1 tablet (0.4 mg total) under the tongue every 5 (five) minutes x 3 doses as needed for chest pain. 11/15/18   Leanor Kail, PA  Nutritional Supplements (ENSURE PO) Take 237 mLs by mouth 2 (two) times daily.     [provider]  Nutritional Supplements (FEEDING SUPPLEMENT, NEPRO CARB STEADY,) LIQD Take 237 mLs by mouth 2 (two) times daily between meals. 12/11/16   Geradine Girt, DO  ondansetron (ZOFRAN) 4 MG tablet Take 4 mg by mouth every 6 (six) hours as needed for nausea or vomiting.    [provider]  senna (SENOKOT) 8.6 MG TABS tablet Take 1 tablet by mouth daily.    [provider]  sevelamer carbonate (RENVELA) 800 MG tablet Take 1,600 mg by mouth 3 (three) times daily with meals.  12/13/18   [provider]    Social History   Socioeconomic History  . Marital status: Widowed    Spouse name: Not on file  . Number of children: 2  . Years of education: Not on file  . Highest education level: Not on file  Occupational History  . Not on file  Social Needs  . Financial resource strain: Not on file  . Food insecurity:    Worry: Not on file    Inability: Not on file  . Transportation needs:    Medical: Not on file    Non-medical: Not on file  Tobacco Use  . Smoking status: Current Every Day Smoker    Packs/day: 0.50    Years: 60.00    Pack years: 30.00    Types: Cigarettes  . Smokeless tobacco: Never Used  . Tobacco comment: 5-6 cigarettes per day  Substance and Sexual Activity  . Alcohol use: No    Alcohol/week: 0.0 standard drinks    Comment: Occasional  . Drug use: No    Frequency: 2.0 times per week    Types: "Crack" cocaine  . Sexual activity: Yes  Lifestyle  . Physical activity:    Days per week: Not on file    Minutes per session: Not on file  . Stress: Not on file  Relationships  . Social connections:     Talks on phone: Not on file    Gets together: Not  on file    Attends religious service: Not on file    Active member of club or organization: Not on file    Attends meetings of clubs or organizations: Not on file    Relationship status: Not on file  . Intimate partner violence:    Fear of current or ex partner: Not on file    Emotionally abused: Not on file    Physically abused: Not on file    Forced sexual activity: Not on file  Other Topics Concern  . Not on file  Social History Narrative   Used to work in a Loss adjuster, chartered.      Family History  Problem Relation Age of Onset  . Heart attack Mother        MI in her 48s  . Diabetes Mother   . Alcohol abuse Father   . Anesthesia problems Neg Hx   . Hypotension Neg Hx   . Malignant hyperthermia Neg Hx   . Pseudochol deficiency Neg Hx     ROS: _0  Positive   _1  Negative   _2  All sytems reviewed and are negative  Cardiac: _3  chest pain/pressure _4  hx CHF  _5  hx CAD/STEMI - see HPI _6  DOE  Vascular: _7  pain in legs while walking _8  pain in legs at rest _9  pain in legs at night _10  non-healing ulcers _11  hx of DVT _12  hx small AAA  Pulmonary: _13  cough _14  COPD  Neurologic: _15  weakness in _16  arms _17  legs _18  numbness in _19  arms _20  legs _21  hx of CVA-total right eye blindness and partial left eye _22 difficulty speaking or slurred speech  Hematologic: _23  hx of cancer  Endocrine:   _24  diabetes _25  thyroid disease  GI _26  vomiting blood _27  blood in stool  GU: _28  CKD/renal failure _29  HD--_30  M/W/F at [facility] or _31  T/T/S  Psychiatric: _32  anxiety _33  depression  Musculoskeletal: _34  arthritis  Integumentary: _35  rashes _36  ulcers  Constitutional: _37  fever _38  chills   Physical Examination  Vitals:   02/28/19 0800 02/28/19 0830  BP: (!) 148/94 (!) 153/79  Pulse: 90 90  Resp: (!) 21 (!) 21  Temp:    SpO2: 99% 99%   Body mass index is 18.61 kg/m.  General:  WDWN in NAD Gait: Not observed  Extremities: 2+ Femoral pulses absent pop pedal pulses, left foot has dry and wet gangrenous changes all 5 toes but toes _39 are worse.  He is very tender to palpation  CBC    Component Value Date/Time   WBC 14.0 (H) 02/28/2019 0140   RBC 4.04 (L) 02/28/2019 0140   HGB 10.9 (L) 02/28/2019 0140   HCT 36.8 (L) 02/28/2019 0140   PLT 236 02/28/2019 0140   MCV 91.1 02/28/2019 0140   MCH 27.0 02/28/2019 0140   MCHC 29.6 (L) 02/28/2019 0140   RDW 15.2 02/28/2019 0140   LYMPHSABS 1.6 02/28/2019 0140   MONOABS 0.9 02/28/2019 0140   EOSABS 0.1 02/28/2019 0140   BASOSABS 0.1 02/28/2019 0140    BMET    Component Value Date/Time   NA 139 02/28/2019 0140   K 5.1 02/28/2019 0140   CL 94 (L) 02/28/2019 0140   CO2 25 02/28/2019 0140   GLUCOSE 123 (H) 02/28/2019 0140   BUN 47 (H) 02/28/2019 0140   CREATININE 9.15 (H) 02/28/2019 0140   CALCIUM 9.6 02/28/2019 0140   GFRNONAA 5 (L) 02/28/2019 0140   GFRAA 6 (L) 02/28/2019 0140    COAGS: Lab Results  Component Value Date   INR 1.04 11/11/2018   INR 1.07 06/29/2017   INR 1.06 01/24/2017     Non-Invasive Vascular Imaging:   Left foot 2 view x-ray 02/28/2019: IMPRESSION: No acute abnormality.  PCXR 02/28/2019 IMPRESSION: 1. Diffuse bilateral reticular interstitial opacities are identified. Findings may reflect pulmonary edema and/or atypical infection. Clinical correlation advised. 2. Suspect small left pleural effusion 3.  Aortic Atherosclerosis   ASSESSMENT/PLAN: This is a 73 y.o. male with gangrenous changes to the left foot. Pt with hx of axillary to bifemoral bypass grafting.  Of note, pt also being evaluated for COVID19 and is on droplet precautions.   Will need left Above knee amputation.  His foot could be the source of the fever.   Unfortunately COVID testing currently taking 7-10 days to result.    NPO p midnight Consent  Left AKA tomorrow Dr Ebony Cargo, MD [facility] [place]  Office: [phone] Pager: [phone]

## 2019-02-28 NOTE — NC FL2 (Signed)
[place] MEDICAID FL2 LEVEL OF CARE SCREENING TOOL     IDENTIFICATION  Patient Name: Trevor [name] Birthdate: 12/24/1945 Sex: male Admission Date (Current Location): 02/28/2019  [facility] and [place] Number:  Herbalist and Address:  [facility], [place] [address] [place], [place] [address]      Provider Number: [id number]  Attending Physician Name and Address:  Nita Sells, MD  Relative Name and Phone Number:       Current Level of Care: Hospital Recommended Level of Care: [place] Prior Approval Number:    Date Approved/Denied:   PASRR Number:    Discharge Plan: [name]    Current Diagnoses: Patient Active Problem List   Diagnosis Date Noted  . Abnormal CXR 02/28/2019  . Marijuana abuse 02/28/2019  . Congestive heart failure (CHF) ([place]) 02/28/2019  . Cellulitis 02/28/2019  . Sepsis ([place]) 02/28/2019  . STEMI (ST elevation myocardial infarction) ([place]) 11/11/2018  . NSTEMI (non-ST elevated myocardial infarction) ([place]) 11/11/2018  . Pulmonary edema 09/20/2018  . Acute respiratory failure with hypoxia ([place]) 03/29/2018  . ESRD (end stage renal disease) ([place]) 03/29/2018  . Sleep stage dysfunction 11/10/2017  . 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 Vascular device, implant, or graft complication 24/58/0998  . Peripheral arterial disease ([place]) 10/20/2017  . Non-allergic rhinitis 10/20/2017  . 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 BPH (benign prostatic hyperplasia) 07/07/2017  . At risk for adverse drug reaction 07/02/2017  . Ischemia of extremity 06/28/2017  . Acute pulmonary edema ([place]) 06/27/2017  . Hypertensive urgency 01/24/2017  . 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 Ischemic chest pain ([place]) 11/01/2016  . Essential hypertension 03/08/2016  . Dyslipidemia 01/12/2016  . Chest pain 08/12/2015  . Accelerated hypertension 08/12/2015  . Carotid stenosis   . Abdominal aortic aneurysm ([place]) 03/06/2015  .  Protein-calorie malnutrition, severe ([place]) 05/20/2014  . End-stage renal disease on hemodialysis ([place]) 05/19/2014  . Anemia of renal disease 05/19/2014  . Coronary artery disease, non-occlusive:  09/28/2012  . Secondary hyperparathyroidism ([place]) 09/27/2012  . Non compliance with medical treatment 09/27/2012  . Acute on chronic combined systolic and diastolic CHF (congestive heart failure) ([place]) 10/10/2011  . A-fib ([place]) 10/08/2011  . History of CVA (cerebrovascular accident) 10/07/2011  . Cocaine abuse ([place]) 10/07/2011  . Tobacco abuse 08/07/2011  . Bruit 08/07/2011    Orientation RESPIRATION BLADDER Height & Weight     Self, Place  O2(Nasal Cannula 2L) Continent Weight: 126 lb (57.2 kg) Height:  5\' 9"  (175.3 cm)  BEHAVIORAL SYMPTOMS/MOOD NEUROLOGICAL BOWEL NUTRITION STATUS      Continent Diet(NPO at this time, please see d/c summary)  AMBULATORY STATUS COMMUNICATION OF NEEDS Skin     Verbally Normal                       Personal Care Assistance Level of Assistance  Bathing, Feeding, Dressing           Functional Limitations Info  Sight, Hearing, Speech Sight Info: Adequate Hearing Info: Adequate Speech Info: Adequate    SPECIAL CARE FACTORS FREQUENCY  PT (By licensed PT), OT (By licensed OT)                    Contractures Contractures Info: Not present    Additional Factors Info  Code Status, Allergies, Isolation Precautions Code Status Info: Full Code Allergies Info: NO known allergies     Isolation Precautions Info: Air/Con Pre     Current  Medications (02/28/2019):  This is the current hospital active medication list Current Facility-Administered Medications  Medication Dose Route Frequency Provider Last Rate Last Dose  . 0.9 %  sodium chloride infusion  100 mL Intravenous PRN Alric Seton, PA-C      . 0.9 %  sodium chloride infusion  100 mL Intravenous PRN Alric Seton, PA-C      . alteplase (CATHFLO ACTIVASE) injection 2 mg  2 mg  Intracatheter Once PRN Alric Seton, PA-C      . aspirin EC tablet 81 mg  81 mg Oral Daily Nita Sells, MD   81 mg at 02/28/19 1359  . ceFEPIme (MAXIPIME) 1 g in sodium chloride 0.9 % 100 mL IVPB  1 g Intravenous Q24H Samtani, Jai-Gurmukh, MD      . Chlorhexidine Gluconate Cloth 2 % PADS 6 each  6 each Topical Q0600 Nita Sells, MD   6 each at 02/28/19 0830  . cinacalcet (SENSIPAR) tablet 90 mg  90 mg Oral Q supper Nita Sells, MD      . clopidogrel (PLAVIX) tablet 75 mg  75 mg Oral Daily Nita Sells, MD   75 mg at 02/28/19 1400  . [START ON 03/02/2019] doxercalciferol (HECTOROL) injection 2 mcg  2 mcg Intravenous Q M,W,F-HD Samtani, Jai-Gurmukh, MD      . feeding supplement (ENSURE ENLIVE) (ENSURE ENLIVE) liquid 237 mL  237 mL Oral TID BM Samtani, Jai-Gurmukh, MD      . feeding supplement (NEPRO CARB STEADY) liquid 237 mL  237 mL Oral BID BM Nita Sells, MD   237 mL at 02/28/19 1359  . gabapentin (NEURONTIN) capsule 300 mg  300 mg Oral QHS Nita Sells, MD      . heparin injection 5,000 Units  5,000 Units Subcutaneous Q8H Nita Sells, MD   5,000 Units at 02/28/19 1359  . Ipratropium-Albuterol (COMBIVENT) respimat 1 puff  1 puff Inhalation Q8H PRN Pierce, Dwayne A, RPH      . lidocaine (PF) (XYLOCAINE) 1 % injection 5 mL  5 mL Intradermal PRN Alric Seton, PA-C      . lidocaine-prilocaine (EMLA) cream 1 application  1 application Topical PRN Alric Seton, PA-C      . pentafluoroprop-tetrafluoroeth (GEBAUERS) aerosol 1 application  1 application Topical PRN Alric Seton, PA-C      . sevelamer carbonate (RENVELA) tablet 1,600 mg  1,600 mg Oral TID WC Samtani, Jai-Gurmukh, MD      . umeclidinium bromide (INCRUSE ELLIPTA) 62.5 MCG/INH 1 puff  1 puff Inhalation Daily Joselyn Glassman A, RPH      . vancomycin (VANCOCIN) IVPB 500 mg/100 ml premix  500 mg Intravenous Q M,W,F-HD Nita Sells, MD         Discharge  Medications: Please see discharge summary for a list of discharge medications.  Relevant Imaging Results:  Relevant Lab Results:   Additional Information SSN: [id number] Dialysis patient MWF  Eileen Stanford, LCSW

## 2019-02-28 NOTE — ED Notes (Signed)
Attempted to call patients sister to give update with no answer.

## 2019-02-28 NOTE — Progress Notes (Addendum)
Pharmacy Antibiotic Note  Trevor [name] is a 73 y.o. male admitted on 02/28/2019 with shortness of breath.  Pharmacy has been consulted for Vancomycin/Cefepime dosing. WBC mildly elevated. ESRD on HD MWF  Plan: Vancomycin 1250 mg IV x 1, then 500 mg qHD MWF Cefepime 1g IV q24h Trend WBC, temp, HD schedule F/U infectious work-up Drug levels as indicated   Height: 5\' 9"  (175.3 cm) Weight: 126 lb (57.2 kg) IBW/kg (Calculated) : 70.7  Temp (24hrs), Avg:101 F (38.3 C), Min:101 F (38.3 C), Max:101 F (38.3 C)  Recent Labs  Lab 02/28/19 0140 02/28/19 0406  WBC 14.0*  --   CREATININE 9.15*  --   LATICACIDVEN 4.8* 1.7    Estimated Creatinine Clearance: 5.8 mL/min (A) (by C-G formula based on SCr of 9.15 mg/dL (H)).    No Known Allergies   Trevor [name] 02/28/2019 5:27 AM

## 2019-02-28 NOTE — H&P (Addendum)
HPI  Trevor [name] SWH:[id number] DOB: [birth year] DOA: 02/28/2019  PCP: [name]   Chief Complaint:   HPI:  39 yr AAM Admit NSTEMI 12/12---11/2015 Also had recent admission ulceration of left upper arm basilic fistula with repair Dr. Donnetta Hutching 12/30/2018 Multiple chronic other comorbid conditions including ESRD on HD, AOCD COPD prior cocaine tobacco abuse(30-year pack history) Partial blindness CAD CHF History of TIA HTN HLD   Comes back to the emergency room with fever and dyspnea he was started on sepsis pathway and antibiotics  Because of the risk of COVID-19 Pandemic, and him being on a nonrebreather, it was felt that he probably suffered from this as the initial etiology  Critical care was consulted  He was weaned down to 5 L however-he tells me that he dialyzes Monday Wednesday Friday [facility]    he gets about 1 to 2 L off and is not very specific  Identifies Pam as his nursing aide who comes out to prune his legs-for several months he has had an ulcer on the little toe and also has spread over the past 2 to 3 months  ED Course: Code sepsis called kept n.p.o. Maxipime vancomycin Flagyl started, blood culture obtained x 2  Review of Systems:   He has not had any cough, any fever, any sputum, any chest pain, any blurred vision, any double vision, any focal neurological deficit, any abdominal pain, any weakness on one side of the body   Past Medical History:  Diagnosis Date  . AAA (abdominal aortic aneurysm) ([place])   . Anemia   . Anxiety   . Arthritis    arms and back  . Blindness   . CHF (congestive heart failure) ([place])   . Coronary artery disease    mild   . 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 ESRD on hemodialysis ([place])    started HD 2014-15  . Headache(784.0)   . HTN (hypertension)   . Protein-calorie malnutrition ([place])   . STEMI (ST elevation myocardial infarction) ([place]) 11/11/2018   . Stroke [facility]) 01/2018   no residual deficits    Past Surgical History:  Procedure Laterality Date  . A/V FISTULAGRAM N/A 07/29/2018   Procedure: A/V FISTULAGRAM - left upper extremity;  Surgeon: Waynetta Sandy, MD;  Location: [place] [facility];  Service: Cardiovascular;  Laterality: N/A;  . AXILLARY-FEMORAL BYPASS GRAFT  06/28/2017   Procedure: BYPASS GRAFT RIGHT AXILLA-BIFEMORAL USING 8MM X 30CM AND 8MM X 60CM HEMASHIELD GOLD GRAFTS;  Surgeon: Rosetta Posner, MD;  Location: [place];  Service: Vascular;;  . CARDIAC CATHETERIZATION N/A 11/01/2016   Procedure: Left Heart Cath and Coronary Angiography;  Surgeon: Lorretta Harp, MD;  Location: [place] [facility];  Service: Cardiovascular;  Laterality: N/A;  . ENDARTERECTOMY FEMORAL Bilateral 06/28/2017   Procedure: ENDARTERECTOMY BILATERAL FEMORAL ARTERIES;  Surgeon: Rosetta Posner, MD;  Location: [place];  Service: Vascular;  Laterality: Bilateral;  . FISTULA SUPERFICIALIZATION Left 6/65/9935   Procedure: PLICATION OF ARTERIOVENOUS FISTULA LEFT ARM;  Surgeon: Waynetta Sandy, MD;  Location: [place];  Service: Vascular;  Laterality: Left;  . FISTULOGRAM Left 05/20/2018   Procedure: FISTULOGRAM WITH BALLOON ANGIOPLASTY LEFT ARM ARTERIOVENOUS FISTULA;  Surgeon: Waynetta Sandy, MD;  Location: [place];  Service: Vascular;  Laterality: Left;  . INSERTION OF DIALYSIS CATHETER  09/21/2012   Procedure: INSERTION OF DIALYSIS CATHETER;  Surgeon: Angelia Mould, MD;  Location: [facility];  Service: Vascular;  Laterality: N/A;  Right Internal Jugular Placement  . LEFT HEART CATH N/A 11/11/2018   Procedure: LEFT HEART CATH;  Surgeon: Sherren Mocha, MD;  Location: [place] [facility];  Service: Cardiovascular;  Laterality: N/A;  . LEFT HEART CATHETERIZATION WITH CORONARY ANGIOGRAM N/A 09/28/2012   Procedure: LEFT HEART CATHETERIZATION WITH CORONARY ANGIOGRAM;  Surgeon: Sherren Mocha, MD;  Location: [facility] CATH LAB;  Service:  Cardiovascular;  Laterality: N/A;  . LEFT HEART CATHETERIZATION WITH CORONARY ANGIOGRAM N/A 07/07/2014   Procedure: LEFT HEART CATHETERIZATION WITH CORONARY ANGIOGRAM;  Surgeon: Leonie Man, MD;  Location: [facility] CATH LAB;  Service: Cardiovascular;  Laterality: N/A;  . PERIPHERAL VASCULAR BALLOON ANGIOPLASTY Left 07/29/2018   Procedure: PERIPHERAL VASCULAR BALLOON ANGIOPLASTY;  Surgeon: Waynetta Sandy, MD;  Location: [place] [facility];  Service: Cardiovascular;  Laterality: Left;  CENTRAL VEIN  . PERIPHERAL VASCULAR INTERVENTION Left 07/29/2018   Procedure: PERIPHERAL VASCULAR INTERVENTION;  Surgeon: Waynetta Sandy, MD;  Location: [place] [facility];  Service: Cardiovascular;  Laterality: Left;  AXILLARY VEIN  . REVISION OF ARTERIOVENOUS GORETEX GRAFT Left 12/30/2018   Procedure: REVISION OF ARTERIOVENOUS FISTULA LEFT ARM;  Surgeon: Rosetta Posner, MD;  Location: [place];  Service: Vascular;  Laterality: Left;  . REVISON OF ARTERIOVENOUS FISTULA Left 08/17/2018   Procedure: REVISION OF ARTERIOVENOUS FISTULA LEFT ARM;  Surgeon: Waynetta Sandy, MD;  Location: [place];  Service: Vascular;  Laterality: Left;  . right hand    . TEE WITHOUT CARDIOVERSION  10/09/2011   Procedure: TRANSESOPHAGEAL ECHOCARDIOGRAM (TEE);  Surgeon: Lelon Perla, MD;  Location: [facility] ENDOSCOPY;  Service: Cardiovascular;  Laterality: N/A;     reports that he has been smoking cigarettes. He has a 30.00 pack-year smoking history. He has never used smokeless tobacco. He reports that he does not drink alcohol or use drugs. Mobility: limited  No Known Allergies  Family History  Problem Relation Age of Onset  . Heart attack Mother        MI in her 74s  . Diabetes Mother   . Alcohol abuse Father   . Anesthesia problems Neg Hx   . Hypotension Neg Hx   . Malignant hyperthermia Neg Hx   . Pseudochol deficiency Neg Hx      Prior to Admission medications   Medication Sig Start Date End Date Taking?  Authorizing Provider  aspirin EC 81 MG tablet Take 1 tablet (81 mg total) by mouth daily. 11/15/18   Bhagat, Crista Luria, PA  atorvastatin (LIPITOR) 80 MG tablet Take 1 tablet (80 mg total) by mouth at bedtime. 11/15/18   Bhagat, Crista Luria, PA  bisacodyl (DULCOLAX) 10 MG suppository Place 10 mg rectally daily as needed (constipation).     [provider]  Cholecalciferol (D3-1000) 25 MCG (1000 UT) tablet Take 1,000 Units by mouth daily.    [provider]  cinacalcet (SENSIPAR) 90 MG tablet Take 90 mg by mouth daily with supper.    [provider]  clopidogrel (PLAVIX) 75 MG tablet Take 1 tablet (75 mg total) by mouth daily. 04/05/15   Barton Dubois, MD  diclofenac sodium (VOLTAREN) 1 % GEL Apply 2 g topically 4 (four) times daily as needed (PAIN IN LEFT FOOT).     [provider]  docusate sodium (COLACE) 100 MG capsule Take 200 mg by mouth at bedtime.     [provider]  doxercalciferol (HECTOROL) 4 MCG/2ML injection Inject 1 mL (2 mcg total) into the  vein every Monday, Wednesday, and Friday with hemodialysis. 01/15/18   Colbert Ewing, MD  gabapentin (NEURONTIN) 300 MG capsule Take 1 capsule (300 mg total) by mouth at bedtime. 01/14/18   Colbert Ewing, MD  Glycopyrrolate [facility] REFILL KIT) 25 MCG/ML SOLN Inhale 25 mcg into the lungs 2 (two) times daily.    [provider]  HYDROcodone-acetaminophen (NORCO) 5-325 MG tablet Take 1 tablet by mouth every 6 (six) hours as needed for moderate pain. 08/17/18   Dagoberto Ligas, PA-C  Icosapent Ethyl (VASCEPA) 1 g CAPS Take 1 g by mouth 2 (two) times daily.     [provider]  ipratropium-albuterol (DUONEB) 0.5-2.5 (3) MG/3ML SOLN Take 3 mLs by nebulization every 8 (eight) hours as needed (for copd).    [provider]  isosorbide mononitrate (IMDUR) 30 MG 24 hr tablet Take 1 tablet (30 mg total) by mouth daily. 11/22/18 02/20/19  Erlene Quan, PA-C  metoprolol tartrate  (LOPRESSOR) 25 MG tablet Take 0.5 tablets (12.5 mg total) by mouth 2 (two) times daily. 11/15/18   Bhagat, Crista Luria, PA  mirtazapine (REMERON) 7.5 MG tablet Take 7.5 mg by mouth at bedtime.    [provider]  nitroGLYCERIN (NITROSTAT) 0.4 MG SL tablet Place 1 tablet (0.4 mg total) under the tongue every 5 (five) minutes x 3 doses as needed for chest pain. 11/15/18   Leanor Kail, PA  Nutritional Supplements (ENSURE PO) Take 237 mLs by mouth 2 (two) times daily.     [provider]  Nutritional Supplements (FEEDING SUPPLEMENT, NEPRO CARB STEADY,) LIQD Take 237 mLs by mouth 2 (two) times daily between meals. 12/11/16   Geradine Girt, DO  ondansetron (ZOFRAN) 4 MG tablet Take 4 mg by mouth every 6 (six) hours as needed for nausea or vomiting.    [provider]  senna (SENOKOT) 8.6 MG TABS tablet Take 1 tablet by mouth daily.    [provider]  sevelamer carbonate (RENVELA) 800 MG tablet Take 1,600 mg by mouth 3 (three) times daily with meals.  12/13/18   [provider]    Physical Exam:  Vitals:   02/28/19 0530 02/28/19 0615  BP: (!) 147/74 (!) 148/73  Pulse: 99   Resp: (!) 23 20  Temp:    SpO2: 90%      Awake coherent slow speaker  S1-S2 no murmur rub or gallop  Chest is clear no added sounds no rales no rhonchi  EOMI NCAT no icterus no pallor  Neck soft supple, no thyromegaly  Abdomen is soft nontender nondistended no rebound no guarding  Neurologically intact range of motion intact   I have personally reviewed following labs and imaging studies  Labs:   Potassium 5.1  BUN/Creatinine 47/9.1 which is about normal baseline LFTs normal  Troponin 0 0.20 in setting of renal insufficiency  Hemoglobin is 10.9 WBC is 14.0   Imaging studies:    x-ray shows diffuse reticular nodular opacities representing possible edema atypical infection    my over read of the x-ray showed possible reticulonodular pattern  unfurling of aortic knuckle displacement of trachea towards the right side may be malrotation of the x-ray no fluid in the fauces however reticulonodular pattern is appreciated of the x-ray  Medical tests:   EKG independently reviewed: PR interval 0.04 QRS axis is -30  Test discussed with performing physician:  No  Decision to obtain old records:   Yes  Review and summation of old records:   He has  Principal Problem:   Acute respiratory failure with hypoxia (HCC) Active Problems:   Tobacco abuse   ESRD (end stage renal disease) (HCC)   Abnormal CXR   Marijuana abuse   Congestive heart failure (CHF) (HCC)   Cellulitis   Assessment/Plan:  Sepsis gangrene vs risk for COVID Cannot definitively state he has COVID or not given lack of adequate testing at this time-- He is placed on the low risk droplet protocol initally, and although I feel that he is low risk, needs rule out   is coherent alert and was asking to change the TV channel when I saw him, although poor historian I have consulted nephrology who will coordinate things for dialysis-he left for dialysis prior to me getting back to see him in the ED from the Xray and his oxygen req's We will test him and presumptively place on Hi risk precautions Wet gangrene, left foot Discussed with Dr. Fredirick Maudlin x-ray of foot to rule out osteomyelitis He has a history of peripheral arterial disease in addition and will need studies as per them Defer to Dr. Oneida Alar further planning COPD COPD Gold stage III Tobacco abuse  Use inalers from home at this Stroke tia CAD 11/11/18 Poor candidate revascl--many attempts at Cardiac cath  Continue Plavix 75 daily, aspirin 81 daily, statin 80, Diabetes mellitus type 2 Does not appear to be on medications at this time? Will need sliding scale coverage ideally but would change to low-dose 7030 if sugars are above 180  Severity of Illness: The appropriate patient status for this patient  is INPATIENT. Inpatient status is judged to be reasonable and necessary in order to provide the required intensity of service to ensure the patient's safety. The patient's presenting symptoms, physical exam findings, and initial radiographic and laboratory data in the context of their chronic comorbidities is felt to place them at high risk for further clinical deterioration. Furthermore, it is not anticipated that the patient will be medically stable for discharge from the hospital within 2 midnights of admission. The following factors support the patient status of inpatient.   " The patient's presenting symptoms include SOB. " The worrisome physical exam findings include fever, Le pain and ulcer. " The initial radiographic and laboratory data are worrisome because of gangrene. " The chronic co-morbidities include as above.   * I certify that at the point of admission it is my clinical judgment that the patient will require inpatient hospital care spanning beyond 2 midnights from the point of admission due to high intensity of service, high risk for further deterioration and high frequency of surveillance required.*     DVT prophylaxis: Heparin Code Status: Full Family Communication: [name].Levins ] Called and updated thoroughly Consults called: renal, vasc    Time spent: 100 minutes  Twyla Dais, MD  [facility] Direct contact: [phone] --[place] [place]  --[email or web address]; password [email or web address]  7PM-7AM contact night coverage as above  02/28/2019, 7:13 AM

## 2019-02-28 NOTE — ED Notes (Signed)
Dr. Roxanne Mins notified of Lactic Acid 4.8

## 2019-02-28 NOTE — Consult Note (Signed)
Renal Service Consult Note [facility]  Trevor [name] 02/28/2019 [name] Requesting Physician:  Dr Verlon Au  Reason for Consult:  ESRD pt w/  HPI: The patient is a 73 y.o. year-old with hx of CVA, ESRD on HD, blindness, CAD presumed (unable to cath in Dec 2019 due to calcified vessels), COPD, dementia presented to ED w/ SOB and hypoxemia.  On HD MWF.  Possible fever per EMS.  Lives in an nursing facility.  Denies any prod cough, CP or orthopnea. Asked to see for dialysis.    Patient has no c/o now, no SOB or cough, no fevers , poor historian though.  He is comfortable.    EChart:  admitted 10/2018 w/ chest pain / ACS / diffuse ST depression, could not get LHC due to severely calcified approach vessels.  Medical Rx.    ROS  denies CP  no joint pain   no HA  no blurry vision  no rash  no diarrhea  no nausea/ vomiting   Past Medical History  Past Medical History:  Diagnosis Date  . AAA (abdominal aortic aneurysm) ([place])   . Anemia   . Anxiety   . Arthritis    arms and back  . Blindness   . CHF (congestive heart failure) ([place])   . Coronary artery disease    mild   . 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 ESRD on hemodialysis ([place])    started HD 2014-15  . Headache(784.0)   . HTN (hypertension)   . Protein-calorie malnutrition ([place])   . STEMI (ST elevation myocardial infarction) ([place]) 11/11/2018  . Stroke [facility]) 01/2018   no residual deficits   Past Surgical History  Past Surgical History:  Procedure Laterality Date  . A/V FISTULAGRAM N/A 07/29/2018   Procedure: A/V FISTULAGRAM - left upper extremity;  Surgeon: Waynetta Sandy, MD;  Location: [place] CV [facility];  Service: Cardiovascular;  Laterality: N/A;  . AXILLARY-FEMORAL BYPASS GRAFT  06/28/2017   Procedure: BYPASS GRAFT RIGHT AXILLA-BIFEMORAL USING 8MM X 30CM AND 8MM X 60CM HEMASHIELD GOLD GRAFTS;  Surgeon: Rosetta Posner, MD;   Location: [place];  Service: Vascular;;  . CARDIAC CATHETERIZATION N/A 11/01/2016   Procedure: Left Heart Cath and Coronary Angiography;  Surgeon: Lorretta Harp, MD;  Location: [place] CV [facility];  Service: Cardiovascular;  Laterality: N/A;  . ENDARTERECTOMY FEMORAL Bilateral 06/28/2017   Procedure: ENDARTERECTOMY BILATERAL FEMORAL ARTERIES;  Surgeon: Rosetta Posner, MD;  Location: [place];  Service: Vascular;  Laterality: Bilateral;  . FISTULA SUPERFICIALIZATION Left 7/86/7672   Procedure: PLICATION OF ARTERIOVENOUS FISTULA LEFT ARM;  Surgeon: Waynetta Sandy, MD;  Location: [place];  Service: Vascular;  Laterality: Left;  . FISTULOGRAM Left 05/20/2018   Procedure: FISTULOGRAM WITH BALLOON ANGIOPLASTY LEFT ARM ARTERIOVENOUS FISTULA;  Surgeon: Waynetta Sandy, MD;  Location: [place];  Service: Vascular;  Laterality: Left;  . INSERTION OF DIALYSIS CATHETER  09/21/2012   Procedure: INSERTION OF DIALYSIS CATHETER;  Surgeon: Angelia Mould, MD;  Location: [place];  Service: Vascular;  Laterality: N/A;  Right Internal Jugular Placement  . LEFT HEART CATH N/A 11/11/2018   Procedure: LEFT HEART CATH;  Surgeon: Sherren Mocha, MD;  Location: [place] CV [facility];  Service: Cardiovascular;  Laterality: N/A;  . LEFT HEART CATHETERIZATION WITH CORONARY ANGIOGRAM N/A 09/28/2012   Procedure: LEFT HEART CATHETERIZATION WITH CORONARY ANGIOGRAM;  Surgeon: Sherren Mocha, MD;  Location:  [place] CATH LAB;  Service: Cardiovascular;  Laterality: N/A;  . LEFT HEART CATHETERIZATION WITH CORONARY ANGIOGRAM N/A 07/07/2014   Procedure: LEFT HEART CATHETERIZATION WITH CORONARY ANGIOGRAM;  Surgeon: Leonie Man, MD;  Location: [facility] CATH LAB;  Service: Cardiovascular;  Laterality: N/A;  . PERIPHERAL VASCULAR BALLOON ANGIOPLASTY Left 07/29/2018   Procedure: PERIPHERAL VASCULAR BALLOON ANGIOPLASTY;  Surgeon: Waynetta Sandy, MD;  Location: [place] [facility];  Service: Cardiovascular;  Laterality: Left;   CENTRAL VEIN  . PERIPHERAL VASCULAR INTERVENTION Left 07/29/2018   Procedure: PERIPHERAL VASCULAR INTERVENTION;  Surgeon: Waynetta Sandy, MD;  Location: [place] [facility];  Service: Cardiovascular;  Laterality: Left;  AXILLARY VEIN  . REVISION OF ARTERIOVENOUS GORETEX GRAFT Left 12/30/2018   Procedure: REVISION OF ARTERIOVENOUS FISTULA LEFT ARM;  Surgeon: Rosetta Posner, MD;  Location: [place];  Service: Vascular;  Laterality: Left;  . REVISON OF ARTERIOVENOUS FISTULA Left 08/17/2018   Procedure: REVISION OF ARTERIOVENOUS FISTULA LEFT ARM;  Surgeon: Waynetta Sandy, MD;  Location: [place];  Service: Vascular;  Laterality: Left;  . right hand    . TEE WITHOUT CARDIOVERSION  10/09/2011   Procedure: TRANSESOPHAGEAL ECHOCARDIOGRAM (TEE);  Surgeon: Lelon Perla, MD;  Location: [facility] ENDOSCOPY;  Service: Cardiovascular;  Laterality: N/A;   Family History  Family History  Problem Relation Age of Onset  . Heart attack Mother        MI in her 21s  . Diabetes Mother   . Alcohol abuse Father   . Anesthesia problems Neg Hx   . Hypotension Neg Hx   . Malignant hyperthermia Neg Hx   . Pseudochol deficiency Neg Hx    Social History  reports that he has been smoking cigarettes. He has a 30.00 pack-year smoking history. He has never used smokeless tobacco. He reports that he does not drink alcohol or use drugs. Allergies No Known Allergies Home medications Prior to Admission medications   Medication Sig Start Date End Date Taking? Authorizing Provider  aspirin EC 81 MG tablet Take 1 tablet (81 mg total) by mouth daily. 11/15/18   Bhagat, Crista Luria, PA  atorvastatin (LIPITOR) 80 MG tablet Take 1 tablet (80 mg total) by mouth at bedtime. 11/15/18   Bhagat, Crista Luria, PA  bisacodyl (DULCOLAX) 10 MG suppository Place 10 mg rectally daily as needed (constipation).     [provider]  Cholecalciferol (D3-1000) 25 MCG (1000 UT) tablet Take 1,000 Units by mouth daily.    [provider]  cinacalcet (SENSIPAR) 90 MG tablet Take 90 mg by mouth daily with supper.    [provider]  clopidogrel (PLAVIX) 75 MG tablet Take 1 tablet (75 mg total) by mouth daily. 04/05/15   Barton Dubois, MD  diclofenac sodium (VOLTAREN) 1 % GEL Apply 2 g topically 4 (four) times daily as needed (PAIN IN LEFT FOOT).     [provider]  docusate sodium (COLACE) 100 MG capsule Take 200 mg by mouth at bedtime.     [provider]  doxercalciferol (HECTOROL) 4 MCG/2ML injection Inject 1 mL (2 mcg total) into the vein every Monday, Wednesday, and Friday with hemodialysis. 01/15/18   Colbert Ewing, MD  gabapentin (NEURONTIN) 300 MG capsule Take 1 capsule (300 mg total) by mouth at bedtime. 01/14/18   Colbert Ewing, MD  Glycopyrrolate [facility] REFILL KIT) 25 MCG/ML SOLN Inhale 25 mcg into the lungs 2 (two) times daily.    [provider]  HYDROcodone-acetaminophen (NORCO) 5-325 MG tablet Take 1  tablet by mouth every 6 (six) hours as needed for moderate pain. 08/17/18   Dagoberto Ligas, PA-C  Icosapent Ethyl (VASCEPA) 1 g CAPS Take 1 g by mouth 2 (two) times daily.     [provider]  ipratropium-albuterol (DUONEB) 0.5-2.5 (3) MG/3ML SOLN Take 3 mLs by nebulization every 8 (eight) hours as needed (for copd).    [provider]  isosorbide mononitrate (IMDUR) 30 MG 24 hr tablet Take 1 tablet (30 mg total) by mouth daily. 11/22/18 02/20/19  Erlene Quan, PA-C  metoprolol tartrate (LOPRESSOR) 25 MG tablet Take 0.5 tablets (12.5 mg total) by mouth 2 (two) times daily. 11/15/18   Bhagat, Crista Luria, PA  mirtazapine (REMERON) 7.5 MG tablet Take 7.5 mg by mouth at bedtime.    [provider]  nitroGLYCERIN (NITROSTAT) 0.4 MG SL tablet Place 1 tablet (0.4 mg total) under the tongue every 5 (five) minutes x 3 doses as needed for chest pain. 11/15/18   Leanor Kail, PA  Nutritional Supplements (ENSURE PO) Take 237 mLs by mouth 2  (two) times daily.     [provider]  Nutritional Supplements (FEEDING SUPPLEMENT, NEPRO CARB STEADY,) LIQD Take 237 mLs by mouth 2 (two) times daily between meals. 12/11/16   Geradine Girt, DO  ondansetron (ZOFRAN) 4 MG tablet Take 4 mg by mouth every 6 (six) hours as needed for nausea or vomiting.    [provider]  senna (SENOKOT) 8.6 MG TABS tablet Take 1 tablet by mouth daily.    [provider]  sevelamer carbonate (RENVELA) 800 MG tablet Take 1,600 mg by mouth 3 (three) times daily with meals.  12/13/18   [provider]   Liver Function Tests Recent Labs  Lab 02/28/19 0140  AST 27  ALT 13  ALKPHOS 87  BILITOT 1.0  PROT 7.7  ALBUMIN 2.6*   No results for input(s): LIPASE, AMYLASE in the last 168 hours. CBC Recent Labs  Lab 02/28/19 0140  WBC 14.0*  NEUTROABS 11.2*  HGB 10.9*  HCT 36.8*  MCV 91.1  PLT 811   Basic Metabolic Panel Recent Labs  Lab 02/28/19 0140  NA 139  K 5.1  CL 94*  CO2 25  GLUCOSE 123*  BUN 47*  CREATININE 9.15*  CALCIUM 9.6   Iron/TIBC/Ferritin/ %Sat    Component Value Date/Time   IRON 46 03/30/2018 0343   TIBC 193 (L) 03/30/2018 0343   FERRITIN 1,290 (H) 03/30/2018 0343   IRONPCTSAT 24 03/30/2018 0343    Vitals:   02/28/19 0935 02/28/19 1000 02/28/19 1030 02/28/19 1040  BP:  132/73 116/69 110/63  Pulse:  71 68 69  Resp:  17 16 15   Temp:    (!) 97.3 F (36.3 C)  TempSrc:    Oral  SpO2: 98% 98% 100% 100%  Weight:      Height:       Exam Gen alert, pleasant, memory so- so, respnods appropriately, no distress No rash, cyanosis or gangrene Sclera anicteric, throat clear  No jvd or bruits Chest dec'd BS bilat , no rales or wheezing RRR no MRG Abd soft ntnd no mass or ascites +bs GU normal male MS no joint effusions or deformity Ext no LE or UE edema, no wounds or ulcers Neuro is alert, Ox 3 , nf LUE AVF+bruit   Home meds:  - aspirin 81/ clopidogrel 75 qd/ atorvastatin 80 isosorbide  mononitrate 30 qd/ sl ntg prn  - sevelamer carb ac tid/ cinacalcet 90 hs  -  hydrocodone -aceta prn/ mirtazapine 7.5hs/ gabapentin 300 hs/ voltaren gel prn  - ipratropium-albuterol tid prn  - prn's/ vitamins/ supplements     MWF [place]  4h   56.5kg   400/A1.5    2/2 bath  LUE AVF  Hep none - hect  1 ug  - mircera 150 ug every 2 wks, last 3/23  - pth 68, ferr 1472, tsat 9% (prior 30%)     Assessment: 1. SOB/ hypoxemia - CXR pattern appears similar to other old images when pt was vol overloaded.  +fever, is now a coronavirus rule-out. HD will be completed when he get to a negative pressure room. Get wts when stable.  2. Gangrene L foot - IV abx per primary, VVS consult 3. ESRD on HD MWF. HD today.  4. CAD - presumed, medical Rx, unable to cath last Dec due to calcified vessels, had diffuse ST depression on EKG's at the time.  5. H/o CVA 6. H/o blindness 7. HTN - not on any bp meds at this time 8. COPD gold stage III 9. DM2 - not on insulin at home    Plan: 1. HD today      [facility] 02/28/2019, 12:20 PM

## 2019-02-28 NOTE — Consult Note (Signed)
NAME:  Trevor [name] MRN:  [id number] DOB:  06/08/1946 LOS: 0 ADMISSION DATE:  02/28/2019  CONSULTATION DATE:  02/28/2019 REFERRING MD:  Dr. Delora Fuel  REASON FOR CONSULTATION:  Acute hypoxemic respiratory failure   Initial Pulmonary/Critical Care Consultation  Brief History   N/A  History of present illness   This 73 y.o. African-American male smoker transferred to the [facility] Emergency Department from [facility] with complaints of shortness of breath and hypoxia.  According to documentation, the patient had SPO2 86% on 3 LPM via nasal cannula.  The patient is awake, alert and oriented to time, person and place at the time of clinical interview, but he is a poor historian.  He has trouble remembering details and communicating specific complaints, which he attributes to his previous stroke.  Nonetheless, the patient reports that he did develop chest tightness and shortness of breath earlier today.  At the time of clinical interview, he is noted to be febrile.  He is visibly dehydrated with a parched tongue.  He has an occasional cough that has an audible rattle, although he is unable to produce sputum.  He has been living at the nursing home for approximately 1-1/2 years.  He states that he is unaware of any known sick contacts.  He has occasional family visitors, none of whom who are acutely ill.  Based on fever, cough and an abnormal chest x-ray, he has been placed on airborne and contact precautions for possible COVID-19 in the emergency department.  However, it is noteworthy that the patient has been a nursing home resident for over 1 year.  He is not ambulatory.  He has no known sick contacts.  In addition, he has been struggling with progressively worsening necrosis of his left toes.  He states that this has been going on since before he was admitted at the nursing home.  Of note, this patient is a [place] [employer] veteran.  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  REVIEW OF SYSTEMS  Constitutional: Cachexia.  Denies fever (although febrile).  No chills. No fatigue. HEENT: Dizziness.  No headaches, dysphagia, sore throat, otalgia, nasal congestion, PND CV:  No chest pain, orthopnea, PND, swelling in lower extremities, palpitations GI:  No abdominal pain, nausea, vomiting, diarrhea, change in bowel pattern, anorexia Resp: Cough with poor sputum production.  No DOE, rest dyspnea, wheezing  GU: ESRD, on dialysis.  No flank pain. 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 Psych:  No change in mood or affect.  Memory difficulty. Skin: Left toes are noted to be necrotic.   Past Medical/Surgical/Social/Family History   Past Medical History:  Diagnosis Date  . AAA (abdominal aortic aneurysm) ([place])   . Anemia   . Anxiety   . Arthritis    arms and back  . Blindness   . CHF (congestive heart failure) ([place])   . Coronary artery disease    mild   . 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 ESRD on hemodialysis ([place])    started HD 2014-15  . Headache(784.0)   . HTN (hypertension)   . Protein-calorie malnutrition ([place])   . STEMI (ST elevation myocardial infarction) ([place]) 11/11/2018  . Stroke [facility]) 01/2018   no residual deficits    Past Surgical History:  Procedure Laterality Date  . A/V FISTULAGRAM N/A 07/29/2018  Procedure: A/V FISTULAGRAM - left upper extremity;  Surgeon: Waynetta Sandy, MD;  Location: [place] CV LAB;  Service: Cardiovascular;  Laterality: N/A;  . AXILLARY-FEMORAL BYPASS GRAFT  06/28/2017   Procedure: BYPASS GRAFT RIGHT AXILLA-BIFEMORAL USING 8MM X 30CM AND 8MM X 60CM HEMASHIELD GOLD GRAFTS;  Surgeon: Rosetta Posner, MD;  Location: [place];  Service: Vascular;;  . CARDIAC CATHETERIZATION N/A 11/01/2016   Procedure: Left Heart Cath and Coronary Angiography;  Surgeon: Lorretta Harp, MD;  Location: [place] CV LAB;   Service: Cardiovascular;  Laterality: N/A;  . ENDARTERECTOMY FEMORAL Bilateral 06/28/2017   Procedure: ENDARTERECTOMY BILATERAL FEMORAL ARTERIES;  Surgeon: Rosetta Posner, MD;  Location: [place];  Service: Vascular;  Laterality: Bilateral;  . FISTULA SUPERFICIALIZATION Left 03/09/8118   Procedure: PLICATION OF ARTERIOVENOUS FISTULA LEFT ARM;  Surgeon: Waynetta Sandy, MD;  Location: [place];  Service: Vascular;  Laterality: Left;  . FISTULOGRAM Left 05/20/2018   Procedure: FISTULOGRAM WITH BALLOON ANGIOPLASTY LEFT ARM ARTERIOVENOUS FISTULA;  Surgeon: Waynetta Sandy, MD;  Location: [place];  Service: Vascular;  Laterality: Left;  . INSERTION OF DIALYSIS CATHETER  09/21/2012   Procedure: INSERTION OF DIALYSIS CATHETER;  Surgeon: Angelia Mould, MD;  Location: [place];  Service: Vascular;  Laterality: N/A;  Right Internal Jugular Placement  . LEFT HEART CATH N/A 11/11/2018   Procedure: LEFT HEART CATH;  Surgeon: Sherren Mocha, MD;  Location: [place] CV LAB;  Service: Cardiovascular;  Laterality: N/A;  . LEFT HEART CATHETERIZATION WITH CORONARY ANGIOGRAM N/A 09/28/2012   Procedure: LEFT HEART CATHETERIZATION WITH CORONARY ANGIOGRAM;  Surgeon: Sherren Mocha, MD;  Location: [facility] CATH LAB;  Service: Cardiovascular;  Laterality: N/A;  . LEFT HEART CATHETERIZATION WITH CORONARY ANGIOGRAM N/A 07/07/2014   Procedure: LEFT HEART CATHETERIZATION WITH CORONARY ANGIOGRAM;  Surgeon: Leonie Man, MD;  Location: [facility] CATH LAB;  Service: Cardiovascular;  Laterality: N/A;  . PERIPHERAL VASCULAR BALLOON ANGIOPLASTY Left 07/29/2018   Procedure: PERIPHERAL VASCULAR BALLOON ANGIOPLASTY;  Surgeon: Waynetta Sandy, MD;  Location: [place] CV LAB;  Service: Cardiovascular;  Laterality: Left;  CENTRAL VEIN  . PERIPHERAL VASCULAR INTERVENTION Left 07/29/2018   Procedure: PERIPHERAL VASCULAR INTERVENTION;  Surgeon: Waynetta Sandy, MD;  Location: [place] CV LAB;  Service:  Cardiovascular;  Laterality: Left;  AXILLARY VEIN  . REVISION OF ARTERIOVENOUS GORETEX GRAFT Left 12/30/2018   Procedure: REVISION OF ARTERIOVENOUS FISTULA LEFT ARM;  Surgeon: Rosetta Posner, MD;  Location: [place];  Service: Vascular;  Laterality: Left;  . REVISON OF ARTERIOVENOUS FISTULA Left 08/17/2018   Procedure: REVISION OF ARTERIOVENOUS FISTULA LEFT ARM;  Surgeon: Waynetta Sandy, MD;  Location: [place];  Service: Vascular;  Laterality: Left;  . right hand    . TEE WITHOUT CARDIOVERSION  10/09/2011   Procedure: TRANSESOPHAGEAL ECHOCARDIOGRAM (TEE);  Surgeon: Lelon Perla, MD;  Location: [facility] ENDOSCOPY;  Service: Cardiovascular;  Laterality: N/A;    Social History   Tobacco Use  . Smoking status: Current Every Day Smoker    Packs/day: 0.50    Years: 60.00    Pack years: 30.00    Types: Cigarettes  . Smokeless tobacco: Never Used  . Tobacco comment: 5-6 cigarettes per day  Substance Use Topics  . Alcohol use: No    Alcohol/week: 0.0 standard drinks    Comment: Occasional    Family History  Problem Relation Age of Onset  . Heart attack Mother        MI in her 87s  .  Diabetes Mother   . Alcohol abuse Father   . Anesthesia problems Neg Hx   . Hypotension Neg Hx   . Malignant hyperthermia Neg Hx   . Pseudochol deficiency Neg Hx      Significant Hospital Events   N/A   Consults:  N/A   Procedures:  N/A   Significant Diagnostic Tests:  N/A   Micro Data:   Results for orders placed or performed during the hospital encounter of 11/11/18  MRSA PCR Screening     Status: None   Collection Time: 11/11/18  9:01 AM  Result Value Ref Range Status   MRSA by PCR NEGATIVE NEGATIVE Final    Comment:        The GeneXpert MRSA Assay (FDA approved for NASAL specimens only), is one component of a comprehensive MRSA colonization surveillance program. It is not intended to diagnose MRSA infection nor to guide or monitor treatment for MRSA infections. Performed  at [facility], [place] [address], [place], [place] [address]       Antimicrobials:  Cefepime/vancomycin (3/30>>  Interim history/subjective:  N/A   Objective   BP (!) 147/74   Pulse 99   Temp (!) 101 F (38.3 C) (Rectal)   Resp (!) 23   Ht 5' 9"  (1.753 m)   Wt 57.2 kg   SpO2 90%   BMI 18.61 kg/m     Filed Weights   02/28/19 0128  Weight: 57.2 kg   No intake or output data in the 24 hours ending 02/28/19 0554      Examination: GENERAL: alert, oriented to time, person and place, pleasant or cachectic. No acute distress. HEAD: normocephalic, atraumatic EYE: PERRLA, EOM intact, no scleral icterus, no pallor. NOSE: nares are patent. No polyps. No exudate. No sinus tenderness. THROAT/ORAL CAVITY: Normal dentition. No oral thrush. No exudate. Mucous membranes are dry. No tonsillar enlargement. Mallampati class II (hard and soft palate, upper portion of tonsils anduvula visible) airway. NECK: supple, no thyromegaly, no JVD, no lymphadenopathy. Trachea midline. CHEST/LUNG: symmetric in development and expansion. Good air entry.  Coarse crackles. HEART: Regular S1 and S2 without murmur, rub or gallop. ABDOMEN: R lateral palpable cord ("scar" according to patient). Soft, nontender, nondistended. Normoactive bowel sounds. No rebound. No guarding. No hepatosplenomegaly. EXTREMITIES: Edema: none. No cyanosis. No clubbing. 2+ DP pulses.  Tenderness involving the left lower extremity from the tibia down to the ankle.  Paresthesia in the left foot.  Necrosis of the left great toe and 3-5th toes. LYMPHATIC: no cervical/axillary/inguinal lymph nodes appreciated MUSCULOSKELETAL: No point tenderness. No bulk atrophy.  SKIN:  Necrotic changes at the left toes (as described above) NEUROLOGIC: Doll's eyes intact. Corneal reflex intact. Spontaneous respirations intact. Cranial nerves II-XII are grossly symmetric and physiologic. Babinski absent. No sensory deficit. Motor: 5/5 @ RUE, 5/5 @  LUE, 5/5 @ RLL,  5/5 @ LLL.  DTR: 2+ @ R biceps, 2+ @ L biceps, 2+ @ R patellar,  2+ @ L patellar. No cerebellar signs. Gait was not assessed.   Resolved Hospital Problem list   N/A   Assessment & Plan:   ASSESSMENT/PLAN:  ASSESSMENT (included in the Hospital Problem List)  Principal Problem:   Acute respiratory failure with hypoxia ([place]) Active Problems:   Cellulitis   ESRD (end stage renal disease) (HCC)   Congestive heart failure (CHF) (HCC)   Tobacco abuse   Abnormal CXR   Marijuana abuse   By systems: PULMONARY  Acute hypoxemic respiratory failure  Abnormal  chest x-ray  Tobacco abuse  Marijuana abuse  History of cocaine abuse   CARDIOVASCULAR  Congestive heart failure, systolic and diastolic  Coronary artery disease with history of myocardial infarctions   RENAL  End-stage renal disease, on hemodialysis  Lactic acidosis, resolved with IV fluids Consult nephrology for hemodialysis   GASTROINTESTINAL  GI PROPHYLAXIS: Protonix   HEMATOLOGIC  DVT PROPHYLAXIS: Heparin   INFECTIOUS  Leukocytosis with monocytosis (7%)  Normocytic anemia/anemia of chronic disease  Skin/soft tissue infection (left foot), suspicious for osteomyelitis Currently on cefepime/vancomycin.  I would advise changing cefepime to Zosyn for empiric coverage of gangrene and possible osteomyelitis. CT left foot without contrast   ENDOCRINE  No acute issues   NEUROLOGIC  History of strokes   PLAN/RECOMMENDATIONS   I have discussed this case with Dr. Hal Hope.  This patient does not qualify for ICU admission at this time.  He is visibly dehydrated and should continue to refuse receive p.o. and IV fluids.  Antibiotics (see above).  Panculture is advised along with imaging of the left foot.  Orthopedicsurgery/podiatry consult is advised for consideration of amputation.  Social work consultation is advised to investigate his [place] service-connected benefits.    My  assessment, plan of care, findings, medications, side effects, etc. were discussed with: Dr. Hal Hope [facility] service) and patient (answered all questions to patient's satisfaction).   Best practice:  Diet: Renal Pain/Anxiety/Delirium protocol (if indicated): Not indicated VAP protocol (if indicated): Not indicated DVT prophylaxis: Heparin GI prophylaxis: Protonix Glucose control: Not indicated Mobility/Activity: Bedrest Family Communication:  No family at the bedside Disposition: Stepdown   Labs   CBC: Recent Labs  Lab 02/28/19 0140  WBC 14.0*  NEUTROABS 11.2*  HGB 10.9*  HCT 36.8*  MCV 91.1  PLT 161    Basic Metabolic Panel: Recent Labs  Lab 02/28/19 0140  NA 139  K 5.1  CL 94*  CO2 25  GLUCOSE 123*  BUN 47*  CREATININE 9.15*  CALCIUM 9.6   GFR: Estimated Creatinine Clearance: 5.8 mL/min (A) (by C-G formula based on SCr of 9.15 mg/dL (H)). Recent Labs  Lab 02/28/19 0140 02/28/19 0406  WBC 14.0*  --   LATICACIDVEN 4.8* 1.7    Liver Function Tests: Recent Labs  Lab 02/28/19 0140  AST 27  ALT 13  ALKPHOS 87  BILITOT 1.0  PROT 7.7  ALBUMIN 2.6*   No results for input(s): LIPASE, AMYLASE in the last 168 hours. No results for input(s): AMMONIA in the last 168 hours.  ABG    Component Value Date/Time   PHART 7.478 (H) 09/20/2018 0510   PCO2ART 34.9 09/20/2018 0510   PO2ART 64.0 (L) 09/20/2018 0510   HCO3 26.0 09/20/2018 0510   TCO2 30 11/11/2018 0757   ACIDBASEDEF 4.0 (H) 07/05/2014 1955   O2SAT 94.0 09/20/2018 0510     Coagulation Profile: No results for input(s): INR, PROTIME in the last 168 hours.  Cardiac Enzymes: Recent Labs  Lab 02/28/19 0140  TROPONINI 0.20*    HbA1C: Hemoglobin A1C  Date/Time Value Ref Range Status  07/31/2017 4.4  Final  [birth year]/45 4.4  Final   Hgb A1c MFr Bld  Date/Time Value Ref Range Status  11/11/2018 10:53 AM 4.5 (L) 4.8 - 5.6 % Final    Comment:    (NOTE) Pre diabetes:           5.7%-6.4% Diabetes:              >6.4% Glycemic control for   <7.0% adults with  diabetes   08/20/2018 04:07 AM 4.8 4.8 - 5.6 % Final    Comment:    (NOTE) Pre diabetes:          5.7%-6.4% Diabetes:              >6.4% Glycemic control for   <7.0% adults with diabetes     CBG: No results for input(s): GLUCAP in the last 168 hours.   Review of Systems:   See above   Past Medical History   Past Medical History:  Diagnosis Date  . AAA (abdominal aortic aneurysm) ([place])   . Anemia   . Anxiety   . Arthritis    arms and back  . Blindness   . CHF (congestive heart failure) ([place])   . Coronary artery disease    mild   . 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 ESRD on hemodialysis ([place])    started HD 2014-15  . Headache(784.0)   . HTN (hypertension)   . Protein-calorie malnutrition ([place])   . STEMI (ST elevation myocardial infarction) ([place]) 11/11/2018  . Stroke [facility]) 01/2018   no residual deficits      Surgical History    Past Surgical History:  Procedure Laterality Date  . A/V FISTULAGRAM N/A 07/29/2018   Procedure: A/V FISTULAGRAM - left upper extremity;  Surgeon: Waynetta Sandy, MD;  Location: [place] CV LAB;  Service: Cardiovascular;  Laterality: N/A;  . AXILLARY-FEMORAL BYPASS GRAFT  06/28/2017   Procedure: BYPASS GRAFT RIGHT AXILLA-BIFEMORAL USING 8MM X 30CM AND 8MM X 60CM HEMASHIELD GOLD GRAFTS;  Surgeon: Rosetta Posner, MD;  Location: [place];  Service: Vascular;;  . CARDIAC CATHETERIZATION N/A 11/01/2016   Procedure: Left Heart Cath and Coronary Angiography;  Surgeon: Lorretta Harp, MD;  Location: [place] CV LAB;  Service: Cardiovascular;  Laterality: N/A;  . ENDARTERECTOMY FEMORAL Bilateral 06/28/2017   Procedure: ENDARTERECTOMY BILATERAL FEMORAL ARTERIES;  Surgeon: Rosetta Posner, MD;  Location: [place];  Service: Vascular;  Laterality: Bilateral;  . FISTULA SUPERFICIALIZATION Left 7/67/2094    Procedure: PLICATION OF ARTERIOVENOUS FISTULA LEFT ARM;  Surgeon: Waynetta Sandy, MD;  Location: [place];  Service: Vascular;  Laterality: Left;  . FISTULOGRAM Left 05/20/2018   Procedure: FISTULOGRAM WITH BALLOON ANGIOPLASTY LEFT ARM ARTERIOVENOUS FISTULA;  Surgeon: Waynetta Sandy, MD;  Location: [place];  Service: Vascular;  Laterality: Left;  . INSERTION OF DIALYSIS CATHETER  09/21/2012   Procedure: INSERTION OF DIALYSIS CATHETER;  Surgeon: Angelia Mould, MD;  Location: [place];  Service: Vascular;  Laterality: N/A;  Right Internal Jugular Placement  . LEFT HEART CATH N/A 11/11/2018   Procedure: LEFT HEART CATH;  Surgeon: Sherren Mocha, MD;  Location: [place] CV LAB;  Service: Cardiovascular;  Laterality: N/A;  . LEFT HEART CATHETERIZATION WITH CORONARY ANGIOGRAM N/A 09/28/2012   Procedure: LEFT HEART CATHETERIZATION WITH CORONARY ANGIOGRAM;  Surgeon: Sherren Mocha, MD;  Location: [facility] CATH LAB;  Service: Cardiovascular;  Laterality: N/A;  . LEFT HEART CATHETERIZATION WITH CORONARY ANGIOGRAM N/A 07/07/2014   Procedure: LEFT HEART CATHETERIZATION WITH CORONARY ANGIOGRAM;  Surgeon: Leonie Man, MD;  Location: [facility] CATH LAB;  Service: Cardiovascular;  Laterality: N/A;  . PERIPHERAL VASCULAR BALLOON ANGIOPLASTY Left 07/29/2018   Procedure: PERIPHERAL VASCULAR BALLOON ANGIOPLASTY;  Surgeon: Waynetta Sandy, MD;  Location: [place] CV LAB;  Service: Cardiovascular;  Laterality: Left;  CENTRAL VEIN  . PERIPHERAL VASCULAR INTERVENTION Left 07/29/2018  Procedure: PERIPHERAL VASCULAR INTERVENTION;  Surgeon: Waynetta Sandy, MD;  Location: [place] CV LAB;  Service: Cardiovascular;  Laterality: Left;  AXILLARY VEIN  . REVISION OF ARTERIOVENOUS GORETEX GRAFT Left 12/30/2018   Procedure: REVISION OF ARTERIOVENOUS FISTULA LEFT ARM;  Surgeon: Rosetta Posner, MD;  Location: [place];  Service: Vascular;  Laterality: Left;  . REVISON OF ARTERIOVENOUS FISTULA Left  08/17/2018   Procedure: REVISION OF ARTERIOVENOUS FISTULA LEFT ARM;  Surgeon: Waynetta Sandy, MD;  Location: [place];  Service: Vascular;  Laterality: Left;  . right hand    . TEE WITHOUT CARDIOVERSION  10/09/2011   Procedure: TRANSESOPHAGEAL ECHOCARDIOGRAM (TEE);  Surgeon: Lelon Perla, MD;  Location: [facility] ENDOSCOPY;  Service: Cardiovascular;  Laterality: N/A;      Social History   Social History   Socioeconomic History  . Marital status: Widowed    Spouse name: Not on file  . Number of children: 2  . Years of education: Not on file  . Highest education level: Not on file  Occupational History  . Not on file  Social Needs  . Financial resource strain: Not on file  . Food insecurity:    Worry: Not on file    Inability: Not on file  . Transportation needs:    Medical: Not on file    Non-medical: Not on file  Tobacco Use  . Smoking status: Current Every Day Smoker    Packs/day: 0.50    Years: 60.00    Pack years: 30.00    Types: Cigarettes  . Smokeless tobacco: Never Used  . Tobacco comment: 5-6 cigarettes per day  Substance and Sexual Activity  . Alcohol use: No    Alcohol/week: 0.0 standard drinks    Comment: Occasional  . Drug use: No    Frequency: 2.0 times per week    Types: "Crack" cocaine  . Sexual activity: Yes  Lifestyle  . Physical activity:    Days per week: Not on file    Minutes per session: Not on file  . Stress: Not on file  Relationships  . Social connections:    Talks on phone: Not on file    Gets together: Not on file    Attends religious service: Not on file    Active member of club or organization: Not on file    Attends meetings of clubs or organizations: Not on file    Relationship status: Not on file  Other Topics Concern  . Not on file  Social History Narrative   Used to work in a Loss adjuster, chartered.       Family History    Family History  Problem Relation Age of Onset  . Heart attack Mother        MI in her 50s  .  Diabetes Mother   . Alcohol abuse Father   . Anesthesia problems Neg Hx   . Hypotension Neg Hx   . Malignant hyperthermia Neg Hx   . Pseudochol deficiency Neg Hx    family history includes Alcohol abuse in his father; Diabetes in his mother; Heart attack in his mother. There is no history of Anesthesia problems, Hypotension, Malignant hyperthermia, or Pseudochol deficiency.    Allergies No Known Allergies    Current Medications  Current Facility-Administered Medications:  .  ceFEPIme (MAXIPIME) 1 g in sodium chloride 0.9 % 100 mL IVPB, 1 g, Intravenous, Q24H, Erenest Blank, RPH .  vancomycin (VANCOCIN) IVPB 500 mg/100 ml premix, 500 mg, Intravenous, Q M,W,F-HD,  Erenest Blank, [facility]  Current Outpatient Medications:  .  aspirin EC 81 MG tablet, Take 1 tablet (81 mg total) by mouth daily., Disp: 90 tablet, Rfl: 3 .  atorvastatin (LIPITOR) 80 MG tablet, Take 1 tablet (80 mg total) by mouth at bedtime., Disp: 30 tablet, Rfl: 6 .  bisacodyl (DULCOLAX) 10 MG suppository, Place 10 mg rectally daily as needed (constipation). , Disp: , Rfl:  .  Cholecalciferol (D3-1000) 25 MCG (1000 UT) tablet, Take 1,000 Units by mouth daily., Disp: , Rfl:  .  cinacalcet (SENSIPAR) 90 MG tablet, Take 90 mg by mouth daily with supper., Disp: , Rfl:  .  clopidogrel (PLAVIX) 75 MG tablet, Take 1 tablet (75 mg total) by mouth daily., Disp: 30 tablet, Rfl: 2 .  diclofenac sodium (VOLTAREN) 1 % GEL, Apply 2 g topically 4 (four) times daily as needed (PAIN IN LEFT FOOT). , Disp: , Rfl:  .  docusate sodium (COLACE) 100 MG capsule, Take 200 mg by mouth at bedtime. , Disp: , Rfl:  .  doxercalciferol (HECTOROL) 4 MCG/2ML injection, Inject 1 mL (2 mcg total) into the vein every Monday, Wednesday, and Friday with hemodialysis., Disp: 2 mL, Rfl:  .  gabapentin (NEURONTIN) 300 MG capsule, Take 1 capsule (300 mg total) by mouth at bedtime., Disp: 30 capsule, Rfl: 0 .  Glycopyrrolate (LONHALA MAGNAIR REFILL KIT) 25 MCG/ML  SOLN, Inhale 25 mcg into the lungs 2 (two) times daily., Disp: , Rfl:  .  HYDROcodone-acetaminophen (NORCO) 5-325 MG tablet, Take 1 tablet by mouth every 6 (six) hours as needed for moderate pain., Disp: 12 tablet, Rfl: 0 .  Icosapent Ethyl (VASCEPA) 1 g CAPS, Take 1 g by mouth 2 (two) times daily. , Disp: , Rfl:  .  ipratropium-albuterol (DUONEB) 0.5-2.5 (3) MG/3ML SOLN, Take 3 mLs by nebulization every 8 (eight) hours as needed (for copd)., Disp: , Rfl:  .  isosorbide mononitrate (IMDUR) 30 MG 24 hr tablet, Take 1 tablet (30 mg total) by mouth daily., Disp: 90 tablet, Rfl: 3 .  metoprolol tartrate (LOPRESSOR) 25 MG tablet, Take 0.5 tablets (12.5 mg total) by mouth 2 (two) times daily., Disp: 30 tablet, Rfl: 5 .  mirtazapine (REMERON) 7.5 MG tablet, Take 7.5 mg by mouth at bedtime., Disp: , Rfl:  .  nitroGLYCERIN (NITROSTAT) 0.4 MG SL tablet, Place 1 tablet (0.4 mg total) under the tongue every 5 (five) minutes x 3 doses as needed for chest pain., Disp: 25 tablet, Rfl: 12 .  Nutritional Supplements (ENSURE PO), Take 237 mLs by mouth 2 (two) times daily. , Disp: , Rfl:  .  Nutritional Supplements (FEEDING SUPPLEMENT, NEPRO CARB STEADY,) LIQD, Take 237 mLs by mouth 2 (two) times daily between meals., Disp: , Rfl: 0 .  ondansetron (ZOFRAN) 4 MG tablet, Take 4 mg by mouth every 6 (six) hours as needed for nausea or vomiting., Disp: , Rfl:  .  senna (SENOKOT) 8.6 MG TABS tablet, Take 1 tablet by mouth daily., Disp: , Rfl:  .  sevelamer carbonate (RENVELA) 800 MG tablet, Take 1,600 mg by mouth 3 (three) times daily with meals. , Disp: , Rfl:   Home Medications  Prior to Admission medications   Medication Sig Start Date End Date Taking? Authorizing Provider  aspirin EC 81 MG tablet Take 1 tablet (81 mg total) by mouth daily. 11/15/18   Bhagat, Crista Luria, PA  atorvastatin (LIPITOR) 80 MG tablet Take 1 tablet (80 mg total) by mouth at bedtime. 11/15/18   Bhagat,  Bhavinkumar, PA  bisacodyl (DULCOLAX) 10  MG suppository Place 10 mg rectally daily as needed (constipation).     [provider]  Cholecalciferol (D3-1000) 25 MCG (1000 UT) tablet Take 1,000 Units by mouth daily.    [provider]  cinacalcet (SENSIPAR) 90 MG tablet Take 90 mg by mouth daily with supper.    [provider]  clopidogrel (PLAVIX) 75 MG tablet Take 1 tablet (75 mg total) by mouth daily. 04/05/15   Barton Dubois, MD  diclofenac sodium (VOLTAREN) 1 % GEL Apply 2 g topically 4 (four) times daily as needed (PAIN IN LEFT FOOT).     [provider]  docusate sodium (COLACE) 100 MG capsule Take 200 mg by mouth at bedtime.     [provider]  doxercalciferol (HECTOROL) 4 MCG/2ML injection Inject 1 mL (2 mcg total) into the vein every Monday, Wednesday, and Friday with hemodialysis. 01/15/18   Colbert Ewing, MD  gabapentin (NEURONTIN) 300 MG capsule Take 1 capsule (300 mg total) by mouth at bedtime. 01/14/18   Colbert Ewing, MD  Glycopyrrolate [facility] REFILL KIT) 25 MCG/ML SOLN Inhale 25 mcg into the lungs 2 (two) times daily.    [provider]  HYDROcodone-acetaminophen (NORCO) 5-325 MG tablet Take 1 tablet by mouth every 6 (six) hours as needed for moderate pain. 08/17/18   Dagoberto Ligas, PA-C  Icosapent Ethyl (VASCEPA) 1 g CAPS Take 1 g by mouth 2 (two) times daily.     [provider]  ipratropium-albuterol (DUONEB) 0.5-2.5 (3) MG/3ML SOLN Take 3 mLs by nebulization every 8 (eight) hours as needed (for copd).    [provider]  isosorbide mononitrate (IMDUR) 30 MG 24 hr tablet Take 1 tablet (30 mg total) by mouth daily. 11/22/18 02/20/19  Erlene Quan, PA-C  metoprolol tartrate (LOPRESSOR) 25 MG tablet Take 0.5 tablets (12.5 mg total) by mouth 2 (two) times daily. 11/15/18   Bhagat, Crista Luria, PA  mirtazapine (REMERON) 7.5 MG tablet Take 7.5 mg by mouth at bedtime.    [provider]  nitroGLYCERIN (NITROSTAT) 0.4 MG SL tablet Place 1  tablet (0.4 mg total) under the tongue every 5 (five) minutes x 3 doses as needed for chest pain. 11/15/18   Leanor Kail, PA  Nutritional Supplements (ENSURE PO) Take 237 mLs by mouth 2 (two) times daily.     [provider]  Nutritional Supplements (FEEDING SUPPLEMENT, NEPRO CARB STEADY,) LIQD Take 237 mLs by mouth 2 (two) times daily between meals. 12/11/16   Geradine Girt, DO  ondansetron (ZOFRAN) 4 MG tablet Take 4 mg by mouth every 6 (six) hours as needed for nausea or vomiting.    [provider]  senna (SENOKOT) 8.6 MG TABS tablet Take 1 tablet by mouth daily.    [provider]  sevelamer carbonate (RENVELA) 800 MG tablet Take 1,600 mg by mouth 3 (three) times daily with meals.  12/13/18   [provider]     Renee Pain, MD Board Certified by the ABIM, [place] Pager: [phone]

## 2019-02-28 NOTE — ED Notes (Signed)
MD at bedside. Taken off non-rebreather and placed on 4L nasal cannula. Patient maintaining O2 sats 91% at this time.

## 2019-02-28 NOTE — H&P (View-Only) (Signed)
Hospital Consult    Reason for Consult:  Ischemic foot Requesting Physician:  Verlon Au  MRN #:  [id number]  History of Present Illness: This is a 73 y.o. male who had one month history of left 5th toe slowly turning dark.  This has progressed over the past few weeks with increasing pain and now dark areas on all 5 toes.  He presented to the ED today with acute respiratory distress.   He had c/o sob, fever of 101 in ED and cough.  He is currently on precautions for COVID19.  The pt has been a [name] resident for over a year.  He is not ambulatory.  He has had no sick contacts.  He did c/o chest tightness and sob earlier.  Pt found to have venous lactic acid of 4.8 on arrival and down to 1.7, leukocytosis of 14k.   He has hx of abdominal u/s revealing 2.6 x 2.7 cm AAA and right CIA with mild dilatation measuring 1.8 x 1.7cm.  Pt has had a axillary to bifemoral bypass by Dr. Donnetta Hutching in 2018.    Pt had STEMI in December 2019 and he had unsuccessful attempts at cardiac catheterization from left and right groin as well as right radial artery.  Pt with severe PAD with previous ax-bifem bypass grafting and severe diffuse atherosclerosis with left arm HD access.  He has hx of ESRD.  On 07/29/18. he underwent balloon angioplasty & stent of left SCV by Dr. Donzetta Matters on 07/29/18.    He underwent revision of LUA AVF with plication 9/81/19.  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    He is a [employer] vet.    The pt is on a statin for cholesterol management.  The pt is on a daily aspirin.   Other AC:  Plavix The pt is not on medication for hypertension.   The pt is not diabetic.   Tobacco hx:  Current; 1/2 ppd for 60 years  Past Medical History:  Diagnosis Date  . AAA (abdominal aortic aneurysm) ([place])   . Anemia   . Anxiety   . Arthritis    arms and back  . Blindness   . CHF (congestive heart failure) ([place])   .  Coronary artery disease    mild   . 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 ESRD on hemodialysis ([place])    started HD 2014-15  . Headache(784.0)   . HTN (hypertension)   . Protein-calorie malnutrition ([place])   . STEMI (ST elevation myocardial infarction) ([place]) 11/11/2018  . Stroke [facility]) 01/2018   no residual deficits    Past Surgical History:  Procedure Laterality Date  . A/V FISTULAGRAM N/A 07/29/2018   Procedure: A/V FISTULAGRAM - left upper extremity;  Surgeon: Waynetta Sandy, MD;  Location: [place] CV LAB;  Service: Cardiovascular;  Laterality: N/A;  . AXILLARY-FEMORAL BYPASS GRAFT  06/28/2017   Procedure: BYPASS GRAFT RIGHT AXILLA-BIFEMORAL USING 8MM X 30CM AND 8MM X 60CM HEMASHIELD GOLD GRAFTS;  Surgeon: Rosetta Posner, MD;  Location: [place];  Service: Vascular;;  . CARDIAC CATHETERIZATION N/A 11/01/2016   Procedure: Left Heart Cath and Coronary Angiography;  Surgeon: Lorretta Harp, MD;  Location: [place] CV [facility];  Service: Cardiovascular;  Laterality: N/A;  . ENDARTERECTOMY FEMORAL Bilateral 06/28/2017   Procedure: ENDARTERECTOMY BILATERAL FEMORAL ARTERIES;  Surgeon:  Rosetta Posner, MD;  Location: [facility] OR;  Service: Vascular;  Laterality: Bilateral;  . FISTULA SUPERFICIALIZATION Left 6/94/5038   Procedure: PLICATION OF ARTERIOVENOUS FISTULA LEFT ARM;  Surgeon: Waynetta Sandy, MD;  Location: [place];  Service: Vascular;  Laterality: Left;  . FISTULOGRAM Left 05/20/2018   Procedure: FISTULOGRAM WITH BALLOON ANGIOPLASTY LEFT ARM ARTERIOVENOUS FISTULA;  Surgeon: Waynetta Sandy, MD;  Location: [place];  Service: Vascular;  Laterality: Left;  . INSERTION OF DIALYSIS CATHETER  09/21/2012   Procedure: INSERTION OF DIALYSIS CATHETER;  Surgeon: Angelia Mould, MD;  Location: [place];  Service: Vascular;  Laterality: N/A;  Right Internal Jugular Placement  . LEFT HEART CATH N/A 11/11/2018   Procedure: LEFT HEART  CATH;  Surgeon: Sherren Mocha, MD;  Location: [place] CV [facility];  Service: Cardiovascular;  Laterality: N/A;  . LEFT HEART CATHETERIZATION WITH CORONARY ANGIOGRAM N/A 09/28/2012   Procedure: LEFT HEART CATHETERIZATION WITH CORONARY ANGIOGRAM;  Surgeon: Sherren Mocha, MD;  Location: [facility] CATH LAB;  Service: Cardiovascular;  Laterality: N/A;  . LEFT HEART CATHETERIZATION WITH CORONARY ANGIOGRAM N/A 07/07/2014   Procedure: LEFT HEART CATHETERIZATION WITH CORONARY ANGIOGRAM;  Surgeon: Leonie Man, MD;  Location[facility] C[facility] LAB;  Service: Cardiovascular;  Laterality: N/A;  . PERIPHERAL VASCULAR BALLOON ANGIOPLASTY Left 07/29/2018   Procedure: PERIPHERAL VASCULAR BALLOON ANGIOPLASTY;  Surgeon: Waynetta Sandy, MD;  Location: [place] CV [facility];  Service: Cardiovascular;  Laterality: Left;  CENTRAL VEIN  . PERIPHERAL VASCULAR INTERVENTION Left 07/29/2018   Procedure: PERIPHERAL VASCULAR INTERVENTION;  Surgeon: Waynetta Sandy, MD;  Location: [place] CV LAB;  Service: Cardiovascular;  Laterality: Left;  AXILLARY VEIN  . REVISION OF ARTERIOVENOUS GORETEX GRAFT Left 12/30/2018   Procedure: REVISION OF ARTERIOVENOUS FISTULA LEFT ARM;  Surgeon: Rosetta Posner, MD;  Location: [place];  Service: Vascular;  Laterality: Left;  . REVISON OF ARTERIOVENOUS FISTULA Left 08/17/2018   Procedure: REVISION OF ARTERIOVENOUS FISTULA LEFT ARM;  Surgeon: Waynetta Sandy, MD;  Location: [place];  Service: Vascular;  Laterality: Left;  . right hand    . TEE WITHOUT CARDIOVERSION  10/09/2011   Procedure: TRANSESOPHAGEAL ECHOCARDIOGRAM (TEE);  Surgeon: Lelon Perla, MD;  Location[facility] ENDOSCOPY;  Service: Cardiovascular;  Laterality: N/A;    No Known Allergies  Prior to Admission medications   Medication Sig Start Date End Date Taking? Authorizing Provider  aspirin EC 81 MG tablet Take 1 tablet (81 mg total) by mouth daily. 11/15/18   Bhagat, Crista Luria, PA  atorvastatin (LIPITOR) 80 MG tablet  Take 1 tablet (80 mg total) by mouth at bedtime. 11/15/18   Bhagat, Crista Luria, PA  bisacodyl (DULCOLAX) 10 MG suppository Place 10 mg rectally daily as needed (constipation).     [provider]  Cholecalciferol (D3-1000) 25 MCG (1000 UT) tablet Take 1,000 Units by mouth daily.    [provider]  cinacalcet (SENSIPAR) 90 MG tablet Take 90 mg by mouth daily with supper.    [provider]  clopidogrel (PLAVIX) 75 MG tablet Take 1 tablet (75 mg total) by mouth daily. 04/05/15   Barton Dubois, MD  diclofenac sodium (VOLTAREN) 1 % GEL Apply 2 g topically 4 (four) times daily as needed (PAIN IN LEFT FOOT).     [provider]  docusate sodium (COLACE) 100 MG capsule Take 200 mg by mouth at bedtime.     [provider]  doxercalciferol (HECTOROL) 4 MCG/2ML injection Inject 1 mL (2 mcg total) into the vein every  Monday, Wednesday, and Friday with hemodialysis. 01/15/18   Colbert Ewing, MD  gabapentin (NEURONTIN) 300 MG capsule Take 1 capsule (300 mg total) by mouth at bedtime. 01/14/18   Colbert Ewing, MD  Glycopyrrolate [facility] REFILL KIT) 25 MCG/ML SOLN Inhale 25 mcg into the lungs 2 (two) times daily.    [provider]  HYDROcodone-acetaminophen (NORCO) 5-325 MG tablet Take 1 tablet by mouth every 6 (six) hours as needed for moderate pain. 08/17/18   Dagoberto Ligas, PA-C  Icosapent Ethyl (VASCEPA) 1 g CAPS Take 1 g by mouth 2 (two) times daily.     [provider]  ipratropium-albuterol (DUONEB) 0.5-2.5 (3) MG/3ML SOLN Take 3 mLs by nebulization every 8 (eight) hours as needed (for copd).    [provider]  isosorbide mononitrate (IMDUR) 30 MG 24 hr tablet Take 1 tablet (30 mg total) by mouth daily. 11/22/18 02/20/19  Erlene Quan, PA-C  metoprolol tartrate (LOPRESSOR) 25 MG tablet Take 0.5 tablets (12.5 mg total) by mouth 2 (two) times daily. 11/15/18   Bhagat, Crista Luria, PA  mirtazapine (REMERON) 7.5 MG tablet  Take 7.5 mg by mouth at bedtime.    [provider]  nitroGLYCERIN (NITROSTAT) 0.4 MG SL tablet Place 1 tablet (0.4 mg total) under the tongue every 5 (five) minutes x 3 doses as needed for chest pain. 11/15/18   Leanor Kail, PA  Nutritional Supplements (ENSURE PO) Take 237 mLs by mouth 2 (two) times daily.     [provider]  Nutritional Supplements (FEEDING SUPPLEMENT, NEPRO CARB STEADY,) LIQD Take 237 mLs by mouth 2 (two) times daily between meals. 12/11/16   Geradine Girt, DO  ondansetron (ZOFRAN) 4 MG tablet Take 4 mg by mouth every 6 (six) hours as needed for nausea or vomiting.    [provider]  senna (SENOKOT) 8.6 MG TABS tablet Take 1 tablet by mouth daily.    [provider]  sevelamer carbonate (RENVELA) 800 MG tablet Take 1,600 mg by mouth 3 (three) times daily with meals.  12/13/18   [provider]    Social History   Socioeconomic History  . Marital status: Widowed    Spouse name: Not on file  . Number of children: 2  . Years of education: Not on file  . Highest education level: Not on file  Occupational History  . Not on file  Social Needs  . Financial resource strain: Not on file  . Food insecurity:    Worry: Not on file    Inability: Not on file  . Transportation needs:    Medical: Not on file    Non-medical: Not on file  Tobacco Use  . Smoking status: Current Every Day Smoker    Packs/day: 0.50    Years: 60.00    Pack years: 30.00    Types: Cigarettes  . Smokeless tobacco: Never Used  . Tobacco comment: 5-6 cigarettes per day  Substance and Sexual Activity  . Alcohol use: No    Alcohol/week: 0.0 standard drinks    Comment: Occasional  . Drug use: No    Frequency: 2.0 times per week    Types: "Crack" cocaine  . Sexual activity: Yes  Lifestyle  . Physical activity:    Days per week: Not on file    Minutes per session: Not on file  . Stress: Not on file  Relationships  . Social connections:     Talks on phone: Not on file    Gets together: Not  on file    Attends religious service: Not on file    Active member of club or organization: Not on file    Attends meetings of clubs or organizations: Not on file    Relationship status: Not on file  . Intimate partner violence:    Fear of current or ex partner: Not on file    Emotionally abused: Not on file    Physically abused: Not on file    Forced sexual activity: Not on file  Other Topics Concern  . Not on file  Social History Narrative   Used to work in a Loss adjuster, chart[employer].      Family History  Problem Relation Age of Onset  . Heart attack Mother        MI in her 48s  . Diabetes Mother   . Alcohol abuse Father   . Anesthesia problems Neg Hx   . Hypotension Neg Hx   . Malignant hyperthermia Neg Hx   . Pseudochol deficiency Neg Hx     ROS: _0  Positive   _1  Negative   _2  All sytems reviewed and are negative  Cardiac: _3  chest pain/pressure _4  hx CHF  _5  hx CAD/STEMI - see HPI _6  DOE  Vascular: _7  pain in legs while walking _8  pain in legs at rest _9  pain in legs at night _10  non-healing ulcers _11  hx of DVT _12  hx small AAA  Pulmonary: _13  cough _14  COPD  Neurologic: _15  weakness in _16  arms _17  legs _18  numbness in _19  arms _20  legs _21  hx of CVA-total right eye blindness and partial left eye _22 difficulty speaking or slurred speech  Hematologic: _23  hx of cancer  Endocrine:   _24  diabetes _25  thyroid disease  GI _26  vomiting blood _27  blood in stool  GU: _28  CKD/renal failure _29  HD--_30  M/W/F at [facility] or _31  T/T/S  Psychiatric: _32  anxiety _33  depression  Musculoskeletal: _34  arthritis  Integumentary: _35  rashes _36  ulcers  Constitutional: _37  fever _38  chills   Physical Examination  Vitals:   02/28/19 0800 02/28/19 0830  BP: (!) 148/94 (!) 153/79  Pulse: 90 90  Resp: (!) 21 (!) 21  Temp:    SpO2: 99% 99%   Body mass index is 18.61 kg/m.  General:  WDWN in NAD Gait: Not observed  Extremities: 2+ Femoral pulses absent pop pedal pulses, left foot has dry and wet gangrenous changes all 5 toes but toes _39 are worse.  He is very tender to palpation  CBC    Component Value Date/Time   WBC 14.0 (H) 02/28/2019 0140   RBC 4.04 (L) 02/28/2019 0140   HGB 10.9 (L) 02/28/2019 0140   HCT 36.8 (L) 02/28/2019 0140   PLT 236 02/28/2019 0140   MCV 91.1 02/28/2019 0140   MCH 27.0 02/28/2019 0140   MCHC 29.6 (L) 02/28/2019 0140   RDW 15.2 02/28/2019 0140   LYMPHSABS 1.6 02/28/2019 0140   MONOABS 0.9 02/28/2019 0140   EOSABS 0.1 02/28/2019 0140   BASOSABS 0.1 02/28/2019 0140    BMET    Component Value Date/Time   NA 139 02/28/2019 0140   K 5.1 02/28/2019 0140   CL 94 (L) 02/28/2019 0140   CO2 25 02/28/2019 0140   GLUCOSE 123 (H) 02/28/2019 0140   BUN 47 (H) 02/28/2019 0140   CREATININE 9.15 (H) 02/28/2019 0140   CALCIUM 9.6 02/28/2019 0140   GFRNONAA 5 (L) 02/28/2019 0140   GFRAA 6 (L) 02/28/2019 0140    COAGS: Lab Results  Component Value Date   INR 1.04 11/11/2018   INR 1.07 06/29/2017   INR 1.06 01/24/2017     Non-Invasive Vascular Imaging:   Left foot 2 view x-ray 02/28/2019: IMPRESSION: No acute abnormality.  PCXR 02/28/2019 IMPRESSION: 1. Diffuse bilateral reticular interstitial opacities are identified. Findings may reflect pulmonary edema and/or atypical infection. Clinical correlation advised. 2. Suspect small left pleural effusion 3.  Aortic Atherosclerosis   ASSESSMENT/PLAN: This is a 73 y.o. male with gangrenous changes to the left foot. Pt with hx of axillary to bifemoral bypass grafting.  Of note, pt also being evaluated for COVID19 and is on droplet precautions.   Will need left Above knee amputation.  His foot could be the source of the fever.   Unfortunately COVID testing currently taking 7-10 days to result.    NPO p midnight Consent  Left AKA tomorrow Dr Ebony Cargo, MD [facility] [place]  Office: [phone] Pager: [phone]

## 2019-02-28 NOTE — ED Provider Notes (Signed)
[facility] EMERGENCY DEPARTMENT Provider Note   CSN: [id number] Arrival date & time: 02/28/19  0122    History   Chief Complaint Chief Complaint  Patient presents with  . Shortness of Breath    HPI Trevor [name] is a 73 y.o. male.  The history is provided by the patient and the EMS personnel.  He has history of hypertension, hyperlipidemia, end-stage renal disease on hemodialysis, coronary artery disease, combined systolic and diastolic heart failure, abdominal aortic aneurysm and comes in with progressive dyspnea which started this morning.  He was last dialyzed 2 days ago.  He is also complaining of some chest pain which he is not able to characterize.  There is a mild cough which is productive of a small amount of sputum, but he does not know the color.  He denies fever, chills, sweats.  There is no nausea or vomiting.  Of note, he is also being treated for an infection in his left foot.  Past Medical History:  Diagnosis Date  . AAA (abdominal aortic aneurysm) ([place])   . Anemia   . Anxiety   . Arthritis    arms and back  . Blindness   . CHF (congestive heart failure) ([place])   . Coronary artery disease    mild   . 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 ESRD on hemodialysis ([place])    started HD 2014-15  . Headache(784.0)   . HTN (hypertension)   . Protein-calorie malnutrition ([place])   . STEMI (ST elevation myocardial infarction) ([place]) 11/11/2018  . Stroke [facility]) 01/2018   no residual deficits    Patient Active Problem List   Diagnosis Date Noted  . STEMI (ST elevation myocardial infarction) ([place]) 11/11/2018  . NSTEMI (non-ST elevated myocardial infarction) ([place]) 11/11/2018  . Pulmonary edema 09/20/2018  . Acute respiratory failure with hypoxia ([place]) 03/29/2018  . ESRD on dialysis ([place]) 03/29/2018  . Sleep stage dysfunction 11/10/2017  . 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 Vascular device, implant, or  graft complication 14/48/1856  . Peripheral arterial disease ([place]) 10/20/2017  . Non-allergic rhinitis 10/20/2017  . 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 BPH (benign prostatic hyperplasia) 07/07/2017  . At risk for adverse drug reaction 07/02/2017  . Ischemia of extremity 06/28/2017  . Acute pulmonary edema ([place]) 06/27/2017  . Hypertensive urgency 01/24/2017  . 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 Ischemic chest pain ([place]) 11/01/2016  . Essential hypertension 03/08/2016  . Dyslipidemia 01/12/2016  . Chest pain 08/12/2015  . Accelerated hypertension 08/12/2015  . Carotid stenosis   . Abdominal aortic aneurysm ([place]) 03/06/2015  . Protein-calorie malnutrition, severe ([place]) 05/20/2014  . End-stage renal disease on hemodialysis ([place]) 05/19/2014  . Anemia of renal disease 05/19/2014  . Coronary artery disease, non-occlusive:  09/28/2012  . Secondary hyperparathyroidism ([place]) 09/27/2012  . Non compliance with medical treatment 09/27/2012  . Acute on chronic combined systolic and diastolic CHF (congestive heart failure) ([place]) 10/10/2011  . A-fib ([place]) 10/08/2011  . History of CVA (cerebrovascular accident) 10/07/2011  . Cocaine abuse ([place]) 10/07/2011  . Tobacco use disorder 08/07/2011  . Bruit 08/07/2011    Past Surgical History:  Procedure Laterality Date  . A/V FISTULAGRAM N/A 07/29/2018   Procedure: A/V FISTULAGRAM - left upper extremity;  Surgeon: Waynetta Sandy, MD;  Location: [facility] CV LAB;  Service: Cardiovascular;  Laterality: N/A;  . AXILLARY-FEMORAL BYPASS GRAFT  06/28/2017   Procedure: BYPASS GRAFT  RIGHT AXILLA-BIFEMORAL USING 8MM X 30CM AND 8MM X 60CM HEMASHIELD GOLD GRAFTS;  Surgeon: Rosetta Posner, MD;  Location: [facility];  Service: Vascular;;  . CARDIAC CATHETERIZATION N/A 11/01/2016   Procedure: Left Heart Cath and Coronary Angiography;  Surgeon: Lorretta Harp, MD;  Location: [facility] CV LAB;  Service: Cardiovascular;  Laterality: N/A;  .  ENDARTERECTOMY FEMORAL Bilateral 06/28/2017   Procedure: ENDARTERECTOMY BILATERAL FEMORAL ARTERIES;  Surgeon: Rosetta Posner, MD;  Location: [facility];  Service: Vascular;  Laterality: Bilateral;  . FISTULA SUPERFICIALIZATION Left 8/67/6720   Procedure: PLICATION OF ARTERIOVENOUS FISTULA LEFT ARM;  Surgeon: Waynetta Sandy, MD;  Location: [facility] [place] [facility];  Service: Vascular;  Laterality: Left;  . FISTULOGRAM Left 05/20/2018   Procedure: FISTULOGRAM WITH BALLOON ANGIOPLASTY LEFT ARM ARTERIOVENOUS FISTULA;  Surgeon: Waynetta Sandy, MD;  Location: [facility];  Service: Vascular;  Laterality: Left;  . INSERTION OF DIALYSIS CATHETER  09/21/2012   Procedure: INSERTION OF DIALYSIS CATHETER;  Surgeon: Angelia Mould, MD;  Location: [facility];  Service: Vascular;  Laterality: N/A;  Right Internal Jugular Placement  . LEFT HEART CATH N/A 11/11/2018   Procedure: LEFT HEART CATH;  Surgeon: Sherren Mocha, MD;  Location: [place] CV LAB;  Service: Cardiovascular;  Laterality: N/A;  . LEFT HEART CATHETERIZATION WITH CORONARY ANGIOGRAM N/A 09/28/2012   Procedure: LEFT HEART CATHETERIZATION WITH CORONARY ANGIOGRAM;  Surgeon: Sherren Mocha, MD;  Location: [facility] CATH LAB;  Service: Cardiovascular;  Laterality: N/A;  . LEFT HEART CATHETERIZATION WITH CORONARY ANGIOGRAM N/A 07/07/2014   Procedure: LEFT HEART CATHETERIZATION WITH CORONARY ANGIOGRAM;  Surgeon: Leonie Man, MD;  Location: [facility] CATH LAB;  Service: Cardiovascular;  Laterality: N/A;  . PERIPHERAL VASCULAR BALLOON ANGIOPLASTY Left 07/29/2018   Procedure: PERIPHERAL VASCULAR BALLOON ANGIOPLASTY;  Surgeon: Waynetta Sandy, MD;  Location: [place] CV LAB;  Service: Cardiovascular;  Laterality: Left;  CENTRAL VEIN  . PERIPHERAL VASCULAR INTERVENTION Left 07/29/2018   Procedure: PERIPHERAL VASCULAR INTERVENTION;  Surgeon: Waynetta Sandy, MD;  Location: [place] CV LAB;  Service: Cardiovascular;  Laterality: Left;  AXILLARY VEIN  .  REVISION OF ARTERIOVENOUS GORETEX GRAFT Left 12/30/2018   Procedure: REVISION OF ARTERIOVENOUS FISTULA LEFT ARM;  Surgeon: Rosetta Posner, MD;  Location: [facility];  Service: Vascular;  Laterality: Left;  . REVISON OF ARTERIOVENOUS FISTULA Left 08/17/2018   Procedure: REVISION OF ARTERIOVENOUS FISTULA LEFT ARM;  Surgeon: Waynetta Sandy, MD;  Location: [facility];  Service: Vascular;  Laterality: Left;  . right hand    . TEE WITHOUT CARDIOVERSION  10/09/2011   Procedure: TRANSESOPHAGEAL ECHOCARDIOGRAM (TEE);  Surgeon: Lelon Perla, MD;  Location: [facility] ENDOSCOPY;  Service: Cardiovascular;  Laterality: N/A;        Home Medications    Prior to Admission medications   Medication Sig Start Date End Date Taking? Authorizing Provider  aspirin EC 81 MG tablet Take 1 tablet (81 mg total) by mouth daily. 11/15/18   Bhagat, Crista Luria, PA  atorvastatin (LIPITOR) 80 MG tablet Take 1 tablet (80 mg total) by mouth at bedtime. 11/15/18   Bhagat, Crista Luria, PA  bisacodyl (DULCOLAX) 10 MG suppository Place 10 mg rectally daily as needed (constipation).     [provider]  Cholecalciferol (D3-1000) 25 MCG (1000 UT) tablet Take 1,000 Units by mouth daily.    [provider]  cinacalcet (SENSIPAR) 90 MG tablet Take 90 mg by mouth daily with supper.    [provider]  clopidogrel (PLAVIX) 75 MG tablet Take  1 tablet (75 mg total) by mouth daily. 04/05/15   Barton Dubois, MD  diclofenac sodium (VOLTAREN) 1 % GEL Apply 2 g topically 4 (four) times daily as needed (PAIN IN LEFT FOOT).     [provider]  docusate sodium (COLACE) 100 MG capsule Take 200 mg by mouth at bedtime.     [provider]  doxercalciferol (HECTOROL) 4 MCG/2ML injection Inject 1 mL (2 mcg total) into the vein every Monday, Wednesday, and Friday with hemodialysis. 01/15/18   Colbert Ewing, MD  gabapentin (NEURONTIN) 300 MG capsule Take 1 capsule (300 mg total) by mouth at bedtime. 01/14/18    Colbert Ewing, MD  Glycopyrrolate [facility] REFILL KIT) 25 MCG/ML SOLN Inhale 25 mcg into the lungs 2 (two) times daily.    [provider]  HYDROcodone-acetaminophen (NORCO) 5-325 MG tablet Take 1 tablet by mouth every 6 (six) hours as needed for moderate pain. 08/17/18   Dagoberto Ligas, PA-C  Icosapent Ethyl (VASCEPA) 1 g CAPS Take 1 g by mouth 2 (two) times daily.     [provider]  ipratropium-albuterol (DUONEB) 0.5-2.5 (3) MG/3ML SOLN Take 3 mLs by nebulization every 8 (eight) hours as needed (for copd).    [provider]  isosorbide mononitrate (IMDUR) 30 MG 24 hr tablet Take 1 tablet (30 mg total) by mouth daily. 11/22/18 02/20/19  Erlene Quan, PA-C  metoprolol tartrate (LOPRESSOR) 25 MG tablet Take 0.5 tablets (12.5 mg total) by mouth 2 (two) times daily. 11/15/18   Bhagat, Crista Luria, PA  mirtazapine (REMERON) 7.5 MG tablet Take 7.5 mg by mouth at bedtime.    [provider]  nitroGLYCERIN (NITROSTAT) 0.4 MG SL tablet Place 1 tablet (0.4 mg total) under the tongue every 5 (five) minutes x 3 doses as needed for chest pain. 11/15/18   Leanor Kail, PA  Nutritional Supplements (ENSURE PO) Take 237 mLs by mouth 2 (two) times daily.     [provider]  Nutritional Supplements (FEEDING SUPPLEMENT, NEPRO CARB STEADY,) LIQD Take 237 mLs by mouth 2 (two) times daily between meals. 12/11/16   Geradine Girt, DO  ondansetron (ZOFRAN) 4 MG tablet Take 4 mg by mouth every 6 (six) hours as needed for nausea or vomiting.    [provider]  senna (SENOKOT) 8.6 MG TABS tablet Take 1 tablet by mouth daily.    [provider]  sevelamer carbonate (RENVELA) 800 MG tablet Take 1,600 mg by mouth 3 (three) times daily with meals.  12/13/18   [provider]    Family History Family History  Problem Relation Age of Onset  . Heart attack Mother        MI in her 14s  . Diabetes Mother   . Alcohol abuse Father   .  Anesthesia problems Neg Hx   . Hypotension Neg Hx   . Malignant hyperthermia Neg Hx   . Pseudochol deficiency Neg Hx     Social History Social History   Tobacco Use  . Smoking status: Current Every Day Smoker    Packs/day: 0.50    Years: 60.00    Pack years: 30.00    Types: Cigarettes  . Smokeless tobacco: Never Used  . Tobacco comment: 5-6 cigarettes per day  Substance Use Topics  . Alcohol use: No    Alcohol/week: 0.0 standard drinks    Comment: Occasional  . Drug use: No    Frequency: 2.0 times per week    Types: "Crack" cocaine  Allergies   Patient has no known allergies.   Review of Systems Review of Systems  All other systems reviewed and are negative.    Physical Exam Updated Vital Signs BP 127/72   Temp (!) 101 F (38.3 C) (Rectal)   Resp (!) 23   Ht _0  (1.753 m)   Wt 57.2 kg   BMI 18.61 kg/m   Physical Exam Vitals signs and nursing note reviewed.    Somewhat cachectic 73 year old male, resting comfortably and in no acute distress. Vital signs are significant for fever and rapid respiratory rate. Oxygen saturation is 100%, which is normal. Head is normocephalic and atraumatic. PERRLA, EOMI. Oropharynx is clear. Neck is nontender and supple without adenopathy or JVD. Back is nontender and there is no CVA tenderness. Lungs have diminished breath sounds with rhonchi throughout.  No rales or wheezes are appreciated. Chest is nontender. Heart has regular rate and rhythm without murmur. Abdomen is soft, flat, nontender without masses or hepatosplenomegaly and peristalsis is normoactive. Extremities have no edema.  Necrotic appearing left first, second, third toes with some foul-smelling drainage.  AV fistula present left upper arm with thrill present. Skin is warm and dry without rash. Neurologic: Mental status is normal, cranial nerves are intact, there are no motor or sensory deficits.  ED Treatments / Results  Labs (all labs ordered are  listed, but only abnormal results are displayed) Labs Reviewed  CBC WITH DIFFERENTIAL/PLATELET - Abnormal; Notable for the following components:      Result Value   WBC 14.0 (*)    RBC 4.04 (*)    Hemoglobin 10.9 (*)    HCT 36.8 (*)    MCHC 29.6 (*)    Neutro Abs 11.2 (*)    Abs Immature Granulocytes 0.14 (*)    All other components within normal limits  COMPREHENSIVE METABOLIC PANEL - Abnormal; Notable for the following components:   Chloride 94 (*)    Glucose, Bld 123 (*)    BUN 47 (*)    Creatinine, Ser 9.15 (*)    Albumin 2.6 (*)    GFR calc non Af Amer 5 (*)    GFR calc Af Amer 6 (*)    Anion gap 20 (*)    All other components within normal limits  TROPONIN I - Abnormal; Notable for the following components:   Troponin I 0.20 (*)    All other components within normal limits  LACTIC ACID, PLASMA - Abnormal; Notable for the following components:   Lactic Acid, Venous 4.8 (*)    All other components within normal limits  CULTURE, BLOOD (ROUTINE X 2)  CULTURE, BLOOD (ROUTINE X 2)  LACTIC ACID, PLASMA    EKG EKG Interpretation  Date/Time:  Monday February 28 2019 01:29:51 EDT Ventricular Rate:  122 PR Interval:    QRS Duration: 92 QT Interval:  313 QTC Calculation: 446 R Axis:   -23 Text Interpretation:  Sinus tachycardia Multiple ventricular premature complexes LVH with secondary repolarization abnormality When compared with ECG of 11/13/2018, HEART RATE has increased Premature ventricular complexes are now present T wave inversion Anterolateral leads is less prominent Confirmed by Delora Fuel ([id number]) on 02/28/2019 1:36:14 AM   Radiology Dg Chest Port 1 View  Result Date: 02/28/2019 CLINICAL DATA:  Shortness of breath.  Decreased oxygen saturation. EXAM: PORTABLE CHEST 1 VIEW COMPARISON:  11/11/2018. FINDINGS: The heart size is normal. There is aortic atherosclerosis identified. Blunting of left costophrenic angle identified which may reflect a small left  pleural  effusion. Diffuse bilateral reticular interstitial opacities are identified. No lobar consolidation identified. IMPRESSION: 1. Diffuse bilateral reticular interstitial opacities are identified. Findings may reflect pulmonary edema and/or atypical infection. Clinical correlation advised. 2. Suspect small left pleural effusion 3.  Aortic Atherosclerosis (ICD10-I70.0). Electronically Signed   By: Kerby Moors M.D.   On: 02/28/2019 02:07    Procedures Procedures  CRITICAL CARE Performed by: Delora Fuel Total critical care time: 95 minutes Critical care time was exclusive of separately billable procedures and treating other patients. Critical care was necessary to treat or prevent imminent or life-threatening deterioration. Critical care was time spent personally by me on the following activities: development of treatment plan with patient and/or surrogate as well as nursing, discussions with consultants, evaluation of patient's response to treatment, examination of patient, obtaining history from patient or surrogate, ordering and performing treatments and interventions, ordering and review of laboratory studies, ordering and review of radiographic studies, pulse oximetry and re-evaluation of patient's condition.  Medications Ordered in ED Medications  ceFEPIme (MAXIPIME) 2 g in sodium chloride 0.9 % 100 mL IVPB (0 g Intravenous Stopped 02/28/19 0246)  metroNIDAZOLE (FLAGYL) IVPB 500 mg (0 mg Intravenous Stopped 02/28/19 0320)  vancomycin (VANCOCIN) 1,250 mg in sodium chloride 0.9 % 250 mL IVPB (1,250 mg Intravenous New Bag/Given 02/28/19 2836)  sodium chloride 0.9 % bolus 1,000 mL (0 mLs Intravenous Stopped 02/28/19 0250)    And  sodium chloride 0.9 % bolus 500 mL (500 mLs Intravenous New Bag/Given 02/28/19 0317)    And  sodium chloride 0.9 % bolus 250 mL (250 mLs Intravenous New Bag/Given 02/28/19 0317)     Initial Impression / Assessment and Plan / ED Course  I have reviewed the triage vital  signs and the nursing notes.  Pertinent labs & imaging results that were available during my care of the patient were reviewed by me and considered in my medical decision making (see chart for details).  Trevor [name] was evaluated in Emergency Department on 02/28/2019 for the symptoms described in the history of present illness. He was evaluated in the context of the global COVID-19 pandemic, which necessitated consideration that the patient might be at risk for infection with the SARS-CoV-2 virus that causes COVID-19. Institutional protocols and algorithms that pertain to the evaluation of patients at risk for COVID-19 are in a state of rapid change based on information released by regulatory bodies including the CDC and federal and state organizations. These policies and algorithms were followed during the patient's care in the ED.  Fever and dyspnea.  X-ray will be obtained to rule out pneumonia.  This also could be pulmonary edema related to his end-stage renal disease.  He is started on sepsis pathway and started on antibiotics.  Old records are reviewed, and he does have recent hospitalizations both for fluid overload and for non-STEMI.  Lactic acid has come back significantly elevated at 4.8.  He is given early goal-directed fluids.  Hemoglobin is 10.9 which is about at his baseline.  Chest x-ray has diffuse bilateral reticular interstitial opacities which were felt to be consistent with either pulmonary edema or atypical infection.  The remainder of his clinical exam does not suggest fluid overloaded state, so this is felt to be most likely an infection.  ECG shows PVCs, but no acute ST or T changes.  His blood pressure and oxygen saturation have remained stable.  Troponin is mildly elevated and will need to be trended.  This certainly could represent demand  ischemia rather than non-STEMI.  Case is discussed with Dr. Hal Hope of [facility] hospitalist, who is concerned that the patient is on  nonrebreather mask and feels patient should be admitted to ICU.  I discussed the case with the intensivist who agrees to admit the patient.  Final Clinical Impressions(s) / ED Diagnoses   Final diagnoses:  Sepsis without acute organ dysfunction, due to unspecified organism [facility])  Community acquired pneumonia, unspecified laterality  Elevated troponin I level  Anemia associated with chronic renal failure  End-stage renal disease on hemodialysis [facility])    ED Discharge Orders    None       Delora Fuel, MD 94/70/76 [phone]

## 2019-02-28 NOTE — ED Triage Notes (Signed)
Patient BIB [facility] from [place] for SOB today. O2 sats 86 % on 3L at facillity. 96% on non-rebreather. Dialysis patient MWF.  Per EMS reports possible fever today.   BP 188/102 HR 132 96% on non-rebreather

## 2019-02-28 NOTE — ED Notes (Signed)
Patient sister would like a call back on patient  Trevor [name]

## 2019-03-01 ENCOUNTER — Inpatient Hospital Stay (HOSPITAL_COMMUNITY): Payer: Medicare Other | Admitting: Certified Registered"

## 2019-03-01 ENCOUNTER — Inpatient Hospital Stay (HOSPITAL_COMMUNITY): Payer: Medicare Other

## 2019-03-01 ENCOUNTER — Encounter (HOSPITAL_COMMUNITY)
Admission: EM | Disposition: A | Discharge status: Skilled Nursing Facility | Payer: Self-pay | Source: Skilled Nursing Facility | Attending: Internal Medicine

## 2019-03-01 DIAGNOSIS — I96 Gangrene, not elsewhere classified: Secondary | ICD-10-CM

## 2019-03-01 HISTORY — PX: AMPUTATION: SHX166

## 2019-03-01 LAB — CBC WITH DIFFERENTIAL/PLATELET
Abs Immature Granulocytes: 0.08 10*3/uL — ABNORMAL HIGH (ref 0.00–0.07)
Basophils Absolute: 0.1 10*3/uL (ref 0.0–0.1)
Basophils Relative: 1 %
Eosinophils Absolute: 0.3 10*3/uL (ref 0.0–0.5)
Eosinophils Relative: 4 %
HCT: 24.8 % — ABNORMAL LOW (ref 39.0–52.0)
Hemoglobin: 8 g/dL — ABNORMAL LOW (ref 13.0–17.0)
Immature Granulocytes: 1 %
Lymphocytes Relative: 12 %
Lymphs Abs: 1.1 10*3/uL (ref 0.7–4.0)
MCH: 28.1 pg (ref 26.0–34.0)
MCHC: 32.3 g/dL (ref 30.0–36.0)
MCV: 87 fL (ref 80.0–100.0)
MONOS PCT: 8 %
Monocytes Absolute: 0.7 10*3/uL (ref 0.1–1.0)
Neutro Abs: 7.1 10*3/uL (ref 1.7–7.7)
Neutrophils Relative %: 74 %
Platelets: 183 10*3/uL (ref 150–400)
RBC: 2.85 MIL/uL — ABNORMAL LOW (ref 4.22–5.81)
RDW: 15 % (ref 11.5–15.5)
WBC: 9.4 10*3/uL (ref 4.0–10.5)
nRBC: 0 % (ref 0.0–0.2)

## 2019-03-01 LAB — COMPREHENSIVE METABOLIC PANEL
ALT: 11 U/L (ref 0–44)
AST: 19 U/L (ref 15–41)
Albumin: 2.2 g/dL — ABNORMAL LOW (ref 3.5–5.0)
Alkaline Phosphatase: 61 U/L (ref 38–126)
Anion gap: 15 (ref 5–15)
BUN: 20 mg/dL (ref 8–23)
CO2: 26 mmol/L (ref 22–32)
Calcium: 8.3 mg/dL — ABNORMAL LOW (ref 8.9–10.3)
Chloride: 97 mmol/L — ABNORMAL LOW (ref 98–111)
Creatinine, Ser: 4.36 mg/dL — ABNORMAL HIGH (ref 0.61–1.24)
GFR calc Af Amer: 15 mL/min — ABNORMAL LOW (ref >60)
GFR, EST NON AFRICAN AMERICAN: 13 mL/min — AB (ref >60)
Glucose, Bld: 78 mg/dL (ref 70–99)
Potassium: 3.7 mmol/L (ref 3.5–5.1)
Sodium: 138 mmol/L (ref 135–145)
Total Bilirubin: 1.1 mg/dL (ref 0.3–1.2)
Total Protein: 6.4 g/dL — ABNORMAL LOW (ref 6.5–8.1)

## 2019-03-01 LAB — SURGICAL PCR SCREEN
MRSA, PCR: NEGATIVE
Staphylococcus aureus: NEGATIVE

## 2019-03-01 LAB — MAGNESIUM: Magnesium: 2.2 mg/dL (ref 1.7–2.4)

## 2019-03-01 LAB — INTERLEUKIN-6, PLASMA: Interleukin-6, Plasma: 224.1 pg/mL — ABNORMAL HIGH (ref 0.0–12.2)

## 2019-03-01 LAB — HEPATITIS B SURFACE ANTIGEN: Hepatitis B Surface Ag: NEGATIVE

## 2019-03-01 LAB — C-REACTIVE PROTEIN: CRP: 19.5 mg/dL — ABNORMAL HIGH (ref <1.0)

## 2019-03-01 LAB — CK: Total CK: 73 U/L (ref 49–397)

## 2019-03-01 SURGERY — AMPUTATION, ABOVE KNEE
Anesthesia: General | Laterality: Left

## 2019-03-01 MED ORDER — CEFAZOLIN SODIUM-DEXTROSE 2-4 GM/100ML-% IV SOLN: 2.0000 g | Freq: Three times a day (TID) | INTRAVENOUS | Status: DC

## 2019-03-01 MED ORDER — SODIUM CHLORIDE 0.9 % IV SOLN
INTRAVENOUS | Status: DC | PRN
  Administered 2019-03-01: 25 ug/min via INTRAVENOUS

## 2019-03-01 MED ORDER — METOPROLOL TARTRATE 5 MG/5ML IV SOLN: 2.0000 mg | INTRAVENOUS | Status: DC | PRN

## 2019-03-01 MED ORDER — BACITRACIN ZINC 500 UNIT/GM EX OINT
TOPICAL_OINTMENT | CUTANEOUS | Status: DC | PRN
  Administered 2019-03-01: 1 via TOPICAL

## 2019-03-01 MED ORDER — MAGNESIUM SULFATE 2 GM/50ML IV SOLN: 2.0000 g | Freq: Once | INTRAVENOUS | Status: DC | PRN

## 2019-03-01 MED ORDER — PANTOPRAZOLE SODIUM 40 MG PO TBEC
40.0000 mg | DELAYED_RELEASE_TABLET | Freq: Every day | ORAL | Status: DC
  Administered 2019-03-01 – 2019-03-06 (×6): 40 mg via ORAL
  Filled 2019-03-01 (×6): qty 1

## 2019-03-01 MED ORDER — GUAIFENESIN-DM 100-10 MG/5ML PO SYRP: 15.0000 mL | ORAL_SOLUTION | ORAL | Status: DC | PRN

## 2019-03-01 MED ORDER — ETOMIDATE 2 MG/ML IV SOLN
INTRAVENOUS | Status: DC | PRN
  Administered 2019-03-01: 14 mg via INTRAVENOUS

## 2019-03-01 MED ORDER — LABETALOL HCL 5 MG/ML IV SOLN: 10.0000 mg | INTRAVENOUS | Status: DC | PRN

## 2019-03-01 MED ORDER — OXYCODONE-ACETAMINOPHEN 5-325 MG PO TABS: 1.0000 | ORAL_TABLET | ORAL | Status: DC | PRN

## 2019-03-01 MED ORDER — KCL IN DEXTROSE-NACL 20-5-0.45 MEQ/L-%-% IV SOLN: INTRAVENOUS | Status: DC

## 2019-03-01 MED ORDER — LIDOCAINE 2% (20 MG/ML) 5 ML SYRINGE
INTRAMUSCULAR | Status: AC
  Filled 2019-03-01: qty 5

## 2019-03-01 MED ORDER — ONDANSETRON HCL 4 MG/2ML IJ SOLN
INTRAMUSCULAR | Status: AC
  Filled 2019-03-01: qty 2

## 2019-03-01 MED ORDER — FENTANYL CITRATE (PF) 250 MCG/5ML IJ SOLN
INTRAMUSCULAR | Status: DC | PRN
  Administered 2019-03-01 (×2): 75 ug via INTRAVENOUS

## 2019-03-01 MED ORDER — BACITRACIN ZINC 500 UNIT/GM EX OINT
TOPICAL_OINTMENT | CUTANEOUS | Status: AC
  Filled 2019-03-01: qty 28.35

## 2019-03-01 MED ORDER — PROPOFOL 10 MG/ML IV BOLUS
INTRAVENOUS | Status: AC
  Filled 2019-03-01: qty 20

## 2019-03-01 MED ORDER — DEXAMETHASONE SODIUM PHOSPHATE 10 MG/ML IJ SOLN
INTRAMUSCULAR | Status: AC
  Filled 2019-03-01: qty 1

## 2019-03-01 MED ORDER — POTASSIUM CHLORIDE CRYS ER 20 MEQ PO TBCR: 20.0000 meq | EXTENDED_RELEASE_TABLET | Freq: Once | ORAL | Status: DC | PRN

## 2019-03-01 MED ORDER — FENTANYL CITRATE (PF) 250 MCG/5ML IJ SOLN
INTRAMUSCULAR | Status: AC
  Filled 2019-03-01: qty 5

## 2019-03-01 MED ORDER — ACETAMINOPHEN 325 MG RE SUPP
325.0000 mg | RECTAL | Status: DC | PRN
  Filled 2019-03-01: qty 2

## 2019-03-01 MED ORDER — ACETAMINOPHEN 325 MG PO TABS: 325.0000 mg | ORAL_TABLET | ORAL | Status: DC | PRN

## 2019-03-01 MED ORDER — CHLORHEXIDINE GLUCONATE CLOTH 2 % EX PADS: 6.0000 | MEDICATED_PAD | Freq: Every day | CUTANEOUS | Status: DC

## 2019-03-01 MED ORDER — METOPROLOL TARTRATE 12.5 MG HALF TABLET
12.5000 mg | ORAL_TABLET | Freq: Two times a day (BID) | ORAL | Status: DC
  Administered 2019-03-01 – 2019-03-06 (×11): 12.5 mg via ORAL
  Filled 2019-03-01 (×11): qty 1

## 2019-03-01 MED ORDER — MORPHINE SULFATE (PF) 2 MG/ML IV SOLN: 2.0000 mg | INTRAVENOUS | Status: DC | PRN

## 2019-03-01 MED ORDER — ENOXAPARIN SODIUM 40 MG/0.4ML ~~LOC~~ SOLN: 40.0000 mg | SUBCUTANEOUS | Status: DC

## 2019-03-01 MED ORDER — DOCUSATE SODIUM 100 MG PO CAPS
100.0000 mg | ORAL_CAPSULE | Freq: Every day | ORAL | Status: DC
  Administered 2019-03-02 – 2019-03-06 (×5): 100 mg via ORAL
  Filled 2019-03-01 (×5): qty 1

## 2019-03-01 MED ORDER — LIDOCAINE 2% (20 MG/ML) 5 ML SYRINGE
INTRAMUSCULAR | Status: DC | PRN
  Administered 2019-03-01: 100 mg via INTRAVENOUS

## 2019-03-01 MED ORDER — ALUM & MAG HYDROXIDE-SIMETH 200-200-20 MG/5ML PO SUSP: 15.0000 mL | ORAL | Status: DC | PRN

## 2019-03-01 MED ORDER — HYDRALAZINE HCL 20 MG/ML IJ SOLN
5.0000 mg | INTRAMUSCULAR | Status: DC | PRN
  Administered 2019-03-06: 07:00:00 5 mg via INTRAVENOUS
  Filled 2019-03-01: qty 1

## 2019-03-01 MED ORDER — PHENOL 1.4 % MT LIQD: 1.0000 | OROMUCOSAL | Status: DC | PRN

## 2019-03-01 MED ORDER — PHENYLEPHRINE 40 MCG/ML (10ML) SYRINGE FOR IV PUSH (FOR BLOOD PRESSURE SUPPORT)
PREFILLED_SYRINGE | INTRAVENOUS | Status: AC
  Filled 2019-03-01: qty 10

## 2019-03-01 MED ORDER — FENTANYL CITRATE (PF) 100 MCG/2ML IJ SOLN: 25.0000 ug | INTRAMUSCULAR | Status: DC | PRN

## 2019-03-01 MED ORDER — 0.9 % SODIUM CHLORIDE (POUR BTL) OPTIME
TOPICAL | Status: DC | PRN
  Administered 2019-03-01: 1000 mL

## 2019-03-01 MED ORDER — ONDANSETRON HCL 4 MG/2ML IJ SOLN: 4.0000 mg | Freq: Four times a day (QID) | INTRAMUSCULAR | Status: DC | PRN

## 2019-03-01 MED ORDER — SUCCINYLCHOLINE CHLORIDE 200 MG/10ML IV SOSY
PREFILLED_SYRINGE | INTRAVENOUS | Status: DC | PRN
  Administered 2019-03-01: 120 mg via INTRAVENOUS

## 2019-03-01 MED ORDER — SUCCINYLCHOLINE CHLORIDE 200 MG/10ML IV SOSY
PREFILLED_SYRINGE | INTRAVENOUS | Status: AC
  Filled 2019-03-01: qty 10

## 2019-03-01 MED ORDER — SODIUM CHLORIDE 0.9 % IV SOLN
INTRAVENOUS | Status: DC | PRN
  Administered 2019-03-01: 12:00:00 via INTRAVENOUS

## 2019-03-01 MED ORDER — ETOMIDATE 2 MG/ML IV SOLN
INTRAVENOUS | Status: AC
  Filled 2019-03-01: qty 20

## 2019-03-01 MED ORDER — DEXAMETHASONE SODIUM PHOSPHATE 10 MG/ML IJ SOLN
INTRAMUSCULAR | Status: DC | PRN
  Administered 2019-03-01: 4 mg via INTRAVENOUS

## 2019-03-01 SURGICAL SUPPLY — 57 items
BANDAGE ACE 4X5 VEL STRL LF (GAUZE/BANDAGES/DRESSINGS) ×3 IMPLANT
BANDAGE ACE 6X5 VEL STRL LF (GAUZE/BANDAGES/DRESSINGS) ×3 IMPLANT
BANDAGE ELASTIC 4 VELCRO ST LF (GAUZE/BANDAGES/DRESSINGS) ×2 IMPLANT
BANDAGE ESMARK 6X9 LF (GAUZE/BANDAGES/DRESSINGS) ×1 IMPLANT
BLADE SAW RECIP 87.9 MT (BLADE) ×3 IMPLANT
BNDG COHESIVE 6X5 TAN STRL LF (GAUZE/BANDAGES/DRESSINGS) ×3 IMPLANT
BNDG ESMARK 6X9 LF (GAUZE/BANDAGES/DRESSINGS) ×3
BNDG GAUZE ELAST 4 BULKY (GAUZE/BANDAGES/DRESSINGS) ×3 IMPLANT
CANISTER SUCT 3000ML PPV (MISCELLANEOUS) ×3 IMPLANT
COVER SURGICAL LIGHT HANDLE (MISCELLANEOUS) ×3 IMPLANT
COVER WAND RF STERILE (DRAPES) ×3 IMPLANT
CUFF TOURNIQUET SINGLE 18IN (TOURNIQUET CUFF) IMPLANT
CUFF TOURNIQUET SINGLE 24IN (TOURNIQUET CUFF) ×2 IMPLANT
CUFF TOURNIQUET SINGLE 34IN LL (TOURNIQUET CUFF) IMPLANT
CUFF TOURNIQUET SINGLE 44IN (TOURNIQUET CUFF) IMPLANT
DRAIN CHANNEL 19F RND (DRAIN) IMPLANT
DRAPE HALF SHEET 40X57 (DRAPES) ×3 IMPLANT
DRAPE ORTHO SPLIT 77X108 STRL (DRAPES) ×4
DRAPE SURG ORHT 6 SPLT 77X108 (DRAPES) ×2 IMPLANT
DRAPE U-SHAPE 47X51 STRL (DRAPES) ×3 IMPLANT
DRSG ADAPTIC 3X8 NADH LF (GAUZE/BANDAGES/DRESSINGS) ×3 IMPLANT
ELECT CAUTERY BLADE 6.4 (BLADE) ×3 IMPLANT
ELECT REM PT RETURN 9FT ADLT (ELECTROSURGICAL) ×3
ELECTRODE REM PT RTRN 9FT ADLT (ELECTROSURGICAL) ×1 IMPLANT
EVACUATOR SILICONE 100CC (DRAIN) IMPLANT
GAUZE SPONGE 4X4 12PLY STRL (GAUZE/BANDAGES/DRESSINGS) ×3 IMPLANT
GAUZE SPONGE 4X4 12PLY STRL LF (GAUZE/BANDAGES/DRESSINGS) ×2 IMPLANT
GLOVE BIO SURGEON STRL SZ7.5 (GLOVE) ×3 IMPLANT
GLOVE BIOGEL PI IND STRL 6.5 (GLOVE) IMPLANT
GLOVE BIOGEL PI IND STRL 7.0 (GLOVE) IMPLANT
GLOVE BIOGEL PI IND STRL 8 (GLOVE) ×1 IMPLANT
GLOVE BIOGEL PI INDICATOR 6.5 (GLOVE) ×2
GLOVE BIOGEL PI INDICATOR 7.0 (GLOVE) ×4
GLOVE BIOGEL PI INDICATOR 8 (GLOVE) ×2
GLOVE ECLIPSE 6.5 STRL STRAW (GLOVE) ×2 IMPLANT
GOWN STRL REUS W/ TWL LRG LVL3 (GOWN DISPOSABLE) ×3 IMPLANT
GOWN STRL REUS W/TWL LRG LVL3 (GOWN DISPOSABLE) ×6
KIT BASIN OR (CUSTOM PROCEDURE TRAY) ×3 IMPLANT
KIT TURNOVER KIT B (KITS) ×3 IMPLANT
NS IRRIG 1000ML POUR BTL (IV SOLUTION) ×3 IMPLANT
PACK GENERAL/GYN (CUSTOM PROCEDURE TRAY) ×3 IMPLANT
PAD ARMBOARD 7.5X6 YLW CONV (MISCELLANEOUS) ×6 IMPLANT
RASP HELIOCORDIAL MED (MISCELLANEOUS) IMPLANT
STAPLER VISISTAT (STAPLE) ×3 IMPLANT
STOCKINETTE IMPERVIOUS LG (DRAPES) ×3 IMPLANT
SUT ETHILON 3 0 PS 1 (SUTURE) IMPLANT
SUT SILK 0 TIES 10X30 (SUTURE) ×3 IMPLANT
SUT SILK 2 0 (SUTURE) ×2
SUT SILK 2 0 SH CR/8 (SUTURE) ×3 IMPLANT
SUT SILK 2-0 18XBRD TIE 12 (SUTURE) ×1 IMPLANT
SUT SILK 3 0 (SUTURE) ×2
SUT SILK 3-0 18XBRD TIE 12 (SUTURE) ×1 IMPLANT
SUT VIC AB 2-0 CT1 18 (SUTURE) ×3 IMPLANT
TOWEL GREEN STERILE (TOWEL DISPOSABLE) ×6 IMPLANT
TOWEL GREEN STERILE FF (TOWEL DISPOSABLE) ×3 IMPLANT
UNDERPAD 30X30 (UNDERPADS AND DIAPERS) ×3 IMPLANT
WATER STERILE IRR 1000ML POUR (IV SOLUTION) ×3 IMPLANT

## 2019-03-01 NOTE — Interval H&P Note (Signed)
History and Physical Interval Note:  03/01/2019 11:37 AM  Trevor [name]  has presented today for surgery, with the diagnosis of ESRD.  The various methods of treatment have been discussed with the patient and family. After consideration of risks, benefits and other options for treatment, the patient has consented to  Procedure(s): AMPUTATION ABOVE KNEE (Left) as a surgical intervention.  The patient's history has been reviewed, patient examined, no change in status, stable for surgery.  I have reviewed the patient's chart and labs.  Questions were answered to the patient's satisfaction.     [name]

## 2019-03-01 NOTE — Anesthesia Postprocedure Evaluation (Signed)
Anesthesia Post Note  Patient: Trevor [name]  Procedure(s) Performed: AMPUTATION ABOVE KNEE (Left )     Patient location during evaluation: Nursing Unit Anesthesia Type: General Level of consciousness: awake, awake and alert and oriented Pain management: pain level controlled Vital Signs Assessment: post-procedure vital signs reviewed and stable Respiratory status: spontaneous breathing, nonlabored ventilation and respiratory function stable Cardiovascular status: blood pressure returned to baseline Anesthetic complications: no    Last Vitals:  Vitals:   03/01/19 1539 03/01/19 1700  BP: (!) 164/90   Pulse: (!) 131   Resp: 20   Temp: (!) 34.4 C 36.6 C  SpO2: 93%     Last Pain:  Vitals:   03/01/19 1700  TempSrc: Oral  PainSc:                  Natividad Schlosser COKER

## 2019-03-01 NOTE — Anesthesia Preprocedure Evaluation (Addendum)
Anesthesia Evaluation  Patient identified by MRN, date of birth, ID band Patient awake    Reviewed: Allergy & Precautions, NPO status , Patient's Chart, lab work & pertinent test results  Airway Mallampati: II  TM Distance: >3 FB Neck ROM: Full    Dental  (+) Edentulous Upper, Edentulous Lower   Pulmonary Current Smoker,     + decreased breath sounds      Cardiovascular hypertension,  Rhythm:Regular Rate:Normal     Neuro/Psych    GI/Hepatic   Endo/Other    Renal/GU      Musculoskeletal   Abdominal   Peds  Hematology   Anesthesia Other Findings   Reproductive/Obstetrics                            Anesthesia Physical Anesthesia Plan  ASA: III  Anesthesia Plan: General   Post-op Pain Management:    Induction: Intravenous  PONV Risk Score and Plan: Ondansetron  Airway Management Planned: Oral ETT  Additional Equipment:   Intra-op Plan:   Post-operative Plan:   Informed Consent: I have reviewed the patients History and Physical, chart, labs and discussed the procedure including the risks, benefits and alternatives for the proposed anesthesia with the patient or authorized representative who has indicated his/her understanding and acceptance.       Plan Discussed with: CRNA and Anesthesiologist  Anesthesia Plan Comments:         Anesthesia Quick Evaluation

## 2019-03-01 NOTE — Progress Notes (Signed)
PT Cancellation Note  Patient Details Name: KARLIN HEILMAN MRN: [id number] DOB: 1946-01-08   Cancelled Treatment:    Reason Eval/Treat Not Completed: Other (comment). Per health system leadership, therapy services are being held at this time until patient tests negative for COVID-19.    Mabeline Caras, PT, DPT Acute Rehabilitation Services  Pager [phone] Office [place] 03/01/2019, 2:31 PM

## 2019-03-01 NOTE — Transfer of Care (Addendum)
Immediate Anesthesia Transfer of Care Note  Patient: Trevor [name]  Procedure(s) Performed: AMPUTATION ABOVE KNEE (Left )  Patient Location: Nursing Unit  Anesthesia Type:General  Level of Consciousness: awake, alert  and patient cooperative  Airway & Oxygen Therapy: Patient Spontanous Breathing and Patient connected to nasal cannula oxygen  Post-op Assessment: Report given to RN and Post -op Vital signs reviewed and stable  Post vital signs: Reviewed and stable  Last Vitals:  Vitals Value Taken Time  BP 160/80 03/01/2019     1:37PM  Temp    Pulse 89 03/01/2019  1:37 PM  Resp 19 03/01/2019  1:37 PM  SpO2 100 % 03/01/2019  1:37 PM  Vitals shown include unvalidated device data.  Last Pain:  Vitals:   03/01/19 0859  TempSrc: Oral  PainSc: 0-No pain      Patients Stated Pain Goal: 2 (47/34/03 7096)  Complications: No apparent anesthesia complications

## 2019-03-01 NOTE — Progress Notes (Signed)
[facility] Progress Note  Subjective: CXR clear today, 1.8 off on HD yesterday.  No new c/o. Is going for L AKA today.   Vitals:   02/28/19 1759 02/28/19 2221 03/01/19 0342 03/01/19 0859  BP: (!) 145/77 140/76 (!) 170/82 138/76  Pulse: (!) 102 (!) 108 100   Resp: (!) 22 (!) 25    Temp: (!) 97 F (36.1 C) 98.9 F (37.2 C) 98.7 F (37.1 C) 98.5 F (36.9 C)  TempSrc: Oral Oral Oral Oral  SpO2: 94% (!) 82% (!) 87%   Weight: 56.5 kg     Height:        Inpatient medications: . aspirin EC  81 mg Oral Daily  . Chlorhexidine Gluconate Cloth  6 each Topical Q0600  . cinacalcet  90 mg Oral Q supper  . clopidogrel  75 mg Oral Daily  . [START ON 03/02/2019] docusate sodium  100 mg Oral Daily  . [START ON 03/02/2019] doxercalciferol  2 mcg Intravenous Q M,W,F-HD  . feeding supplement (ENSURE ENLIVE)  237 mL Oral TID BM  . feeding supplement (NEPRO CARB STEADY)  237 mL Oral BID BM  . gabapentin  300 mg Oral QHS  . heparin  5,000 Units Subcutaneous Q8H  . metoprolol tartrate  12.5 mg Oral BID  . pantoprazole  40 mg Oral Daily  . sevelamer carbonate  1,600 mg Oral TID WC  . umeclidinium bromide  1 puff Inhalation Daily  . vancomycin  500 mg Intravenous Q M,W,F-HD   . ceFEPime (MAXIPIME) IV 1 g (02/28/19 2016)   acetaminophen **OR** acetaminophen, alteplase, fentaNYL (SUBLIMAZE) injection, guaiFENesin-dextromethorphan, hydrALAZINE, Ipratropium-Albuterol, labetalol, lidocaine (PF), lidocaine-prilocaine, metoprolol tartrate, ondansetron, oxyCODONE, pentafluoroprop-tetrafluoroeth, phenol, traMADol  Iron/TIBC/Ferritin/ %Sat    Component Value Date/Time   IRON 46 03/30/2018 0343   TIBC 193 (L) 03/30/2018 0343   FERRITIN 1,290 (H) 03/30/2018 0343   IRONPCTSAT 24 03/30/2018 0343    Exam: Gen alert, pleasant, memory so- so, respnods appropriately, no distress No rash, cyanosis or gangrene Sclera anicteric, throat clear  No jvd or bruits Chest dec'd BS bilat , no rales or  wheezing RRR no MRG Abd soft ntnd no mass or ascites +bs GU normal male MS no joint effusions or deformity Ext no LE or UE edema, no wounds or ulcers Neuro is alert, Ox 3 , nf LUE AVF+bruit   Home meds:  - aspirin 81/ clopidogrel 75 qd/ atorvastatin 80 isosorbide mononitrate 30 qd/ sl ntg prn  - sevelamer carb ac tid/ cinacalcet 90 hs  - hydrocodone -aceta prn/ mirtazapine 7.5hs/ gabapentin 300 hs/ voltaren gel prn  - ipratropium-albuterol tid prn  - prn's/ vitamins/ supplements     MWF [facility]  4h   56.5kg   400/A1.5    2/2 bath  LUE AVF  Hep none - hect  1 ug  - mircera 150 ug every 2 wks, last 3/23  - pth 68, ferr 1472, tsat 9% (prior 30%)     Assessment: 1. SOB/ hypoxemia - CXR clear now after HD yesterday. Suspect vol / pulm edema now resolved.  2. Gangrene L foot - for AKA later today 3. ESRD on HD MWF. HD tomorrow. Try to lower edw.   4. CAD - presumed, medical Rx, unable to cath last Dec due to calcified vessels, had diffuse ST depression on EKG's at the time.  5. H/o CVA 6. H/o blindness 7. HTN - not on any bp meds at this time 8. COPD gold stage III 9. DM2 -  not on insulin at home       [place] 03/01/2019, 2:54 PM  Recent Labs  Lab 02/28/19 0140 03/01/19 1004  NA 139 138  K 5.1 3.7  CL 94* 97*  CO2 25 26  GLUCOSE 123* 78  BUN 47* 20  CREATININE 9.15* 4.36*  CALCIUM 9.6 8.3*  ALBUMIN 2.6* 2.2*   Recent Labs  Lab 02/28/19 0140 03/01/19 1004  AST 27 19  ALT 13 11  ALKPHOS 87 61  BILITOT 1.0 1.1  PROT 7.7 6.4*   Recent Labs  Lab 02/28/19 0140 03/01/19 1004  WBC 14.0* 9.4  NEUTROABS 11.2* 7.1  HGB 10.9* 8.0*  HCT 36.8* 24.8*  MCV 91.1 87.0  PLT 236 183

## 2019-03-01 NOTE — Anesthesia Procedure Notes (Signed)
Procedure Name: Intubation Date/Time: 03/01/2019 11:50 AM Performed by: Renato Shin, CRNA Pre-anesthesia Checklist: Patient identified, Emergency Drugs available, Suction available and Patient being monitored Patient Re-evaluated:Patient Re-evaluated prior to induction Oxygen Delivery Method: Circle system utilized Preoxygenation: Pre-oxygenation with 100% oxygen Induction Type: IV induction and Rapid sequence Laryngoscope Size: Glidescope and 4 Grade View: Grade I Tube type: Oral Tube size: 7.0 mm Airway Equipment and Method: Rigid stylet and Video-laryngoscopy Placement Confirmation: ETT inserted through vocal cords under direct vision,  positive ETCO2 and breath sounds checked- equal and bilateral Secured at: 21 cm Tube secured with: Tape Dental Injury: Teeth and Oropharynx as per pre-operative assessment

## 2019-03-01 NOTE — Progress Notes (Signed)
PROGRESS NOTE    Trevor [name]  WNU:[id number] DOB: 1946-05-20 DOA: 02/28/2019 PCP: [name]   Brief Narrative: Patient is a 73 year old African-American with history of coronary artery disease, ESRD on dialysis, COPD, possible diabetes, CHF, TIA ,hypertension, hyperlipidemia, peripheral vascular disease presented to the emergency department with complaints of fever, dyspnea.  Initially was very hypoxic on presentation.CXR showed Diffuse bilateral reticular interstitial opacities suggesting  pulmonary edema and/or atypical Infection.  Patient also has chronic worsening necrosis of his left toes.  Gangrene suspected.  Started on broad-spectrum antibiotics for possible sepsis.  Nephrology, vascular surgery consulted.  Was also suspected to possible occluded last night because of chest x-ray abnormalities and hypoxia so testing sent.  Vascular surgery planning for left AKA today .  Assessment & Plan:   Principal Problem:   Acute respiratory failure with hypoxia (HCC) Active Problems:   Tobacco abuse   ESRD (end stage renal disease) (HCC)   Abnormal CXR   Marijuana abuse   Congestive heart failure (CHF) (HCC)   Cellulitis   Sepsis ([place])  Acute hypoxic respiratory failure: Most likely secondary to pulmonary edema.  He is a dialysis patient.  Respiratory status has significantly improved after dialysis.  Currently saturating fine on room air.  Chest x-ray on presentation showed diffuse infiltrates most likely secondary to pulmonary edema. Low suspicion for COVID-19.  Testing has been sent.  Continue droplet precaution.  Will discontinue airborne precaution.  ESRD on dialysis: Nephrology following.  Dialyzed yesterday.  Plan for dialysis tomorrow.  He has AV graft on the left arm.  Suspected sepsis: Presented with leukocytosis.  Lactic acidosis of 4.8.  Elevated CRP.  Sepsis suspected on presentation.  Most likely secondary to left foot gangrene.  Started on broad-spectrum  antibiotics.  Lactic acid has normalized.  Continue broad-spectrum antibiotics for now.  Follow-up cultures.  Left foot gangrene: Has history of peripheral vascular disease.  Follows with vascular surgery as an outpatient.  Has history of axillary to bifemoral bypass grafting. vascular surgery following here and he is undergoing left AKA today.  Can consider discontinuing  antibiotics after amputation .  History of coronary artery disease: Denies any chest pain at present.  Continue his home medications.  On aspirin, Plavix and statin at home.  Poor candidate for revascularization.  COPD Gold stage III/active smoker: Counseled for smoking cessation.  Currently COPD status stable.  Continue home inhaler  History of stroke/TIA: Continue aspirin, Plavix and statin after surgery.           PPE: Gown,Gloves, N95,Face shield DVT prophylaxis:Heparin [place] Code Status: Full Family Communication: None present at the bedside Disposition Plan: Undetermined at present.  Undergoing AKA today.  Should be cleared by vascular surgery before discharge.  Needs PT evaluation before discharge.  COVID-19 test pending   Consultants: Vascular surgery, nephrology  Procedures: Dialysis Antimicrobials:  Anti-infectives (From admission, onward)   Start     Dose/Rate Route Frequency Ordered Stop   02/28/19 2200  ceFEPIme (MAXIPIME) 1 g in sodium chloride 0.9 % 100 mL IVPB     1 g 200 mL/hr over 30 Minutes Intravenous Every 24 hours 02/28/19 0540     02/28/19 1200  vancomycin (VANCOCIN) IVPB 500 mg/100 ml premix     500 mg 100 mL/hr over 60 Minutes Intravenous Every M-W-F (Hemodialysis) 02/28/19 0540     02/28/19 0215  vancomycin (VANCOCIN) 1,250 mg in sodium chloride 0.9 % 250 mL IVPB     1,250 mg 166.7 mL/hr over 90  Minutes Intravenous  Once 03/26/19 0202 26-Mar-2019 0500   03/26/2019 0200  ceFEPIme (MAXIPIME) 2 g in sodium chloride 0.9 % 100 mL IVPB     2 g 200 mL/hr over 30 Minutes Intravenous  Once 03-26-2019  0157 03-26-2019 0246   26-Mar-2019 0200  metroNIDAZOLE (FLAGYL) IVPB 500 mg     500 mg 100 mL/hr over 60 Minutes Intravenous  Once March 26, 2019 0157 03/26/2019 0320   2019-03-26 0200  vancomycin (VANCOCIN) IVPB 1000 mg/200 mL premix  Status:  Discontinued     1,000 mg 200 mL/hr over 60 Minutes Intravenous  Once 2019-03-26 0157 2019/03/26 0202      Subjective: Patient seen and examined the bedside this morning.  Looked comfortable.  Hemodynamically stable.  Denies any shortness of breath.  He was not in any kind of respiratory distress and saturating fine on room air today.  Objective: Vitals:   2019/03/26 1730 2019-03-26 1759 March 26, 2019 2221 03/01/19 0342  BP: (!) 145/99 (!) 145/77 140/76 (!) 170/82  Pulse: 85 (!) 102 (!) 108 100  Resp: (!) 24 (!) 22 (!) 25   Temp:  (!) 97 F (36.1 C) 98.9 F (37.2 C) 98.7 F (37.1 C)  TempSrc:  Oral Oral Oral  SpO2:  94% (!) 82% (!) 87%  Weight:  56.5 kg    Height:        Intake/Output Summary (Last 24 hours) at 03/01/2019 1138 Last data filed at 26-Mar-2019 1759 Gross per 24 hour  Intake 257 ml  Output 1500 ml  Net -1243 ml   Filed Weights   Mar 26, 2019 0128 03-26-19 1415 03/26/2019 1759  Weight: 57.2 kg 58 kg 56.5 kg    Examination:  General exam: Appears calm and comfortable ,Not in distress,thin built HEENT:PERRL,Oral mucosa moist, Ear/Nose normal on gross exam Respiratory system: Bilateral equal air entry, normal vesicular breath sounds, no wheezes or crackles  Cardiovascular system: S1 & S2 heard, RRR. No JVD, murmurs, rubs, gallops or clicks. No pedal edema. Gastrointestinal system: Abdomen is nondistended, soft and nontender. No organomegaly or masses felt. Normal bowel sounds heard. Central nervous system: Alert and oriented. No focal neurological deficits. Extremities: No edema, no clubbing ,no cyanosis,  Left foot gangrene covered with dressing     Data Reviewed: I have personally reviewed following labs and imaging studies  CBC: Recent Labs    Lab 03-26-2019 0140 03/01/19 1004  WBC 14.0* 9.4  NEUTROABS 11.2* 7.1  HGB 10.9* 8.0*  HCT 36.8* 24.8*  MCV 91.1 87.0  PLT 236 694   Basic Metabolic Panel: Recent Labs  Lab 2019/03/26 0140 03/01/19 0655  NA 139  --   K 5.1  --   CL 94*  --   CO2 25  --   GLUCOSE 123*  --   BUN 47*  --   CREATININE 9.15*  --   CALCIUM 9.6  --   MG  --  2.2   GFR: Estimated Creatinine Clearance: 5.7 mL/min (A) (by C-G formula based on SCr of 9.15 mg/dL (H)). Liver Function Tests: Recent Labs  Lab 03/26/19 0140  AST 27  ALT 13  ALKPHOS 87  BILITOT 1.0  PROT 7.7  ALBUMIN 2.6*   No results for input(s): LIPASE, AMYLASE in the last 168 hours. No results for input(s): AMMONIA in the last 168 hours. Coagulation Profile: No results for input(s): INR, PROTIME in the last 168 hours. Cardiac Enzymes: Recent Labs  Lab 03/26/19 0140 03/01/19 0655  CKTOTAL  --  73  TROPONINI 0.20*  --  BNP (last 3 results) No results for input(s): PROBNP in the last 8760 hours. HbA1C: No results for input(s): HGBA1C in the last 72 hours. CBG: No results for input(s): GLUCAP in the last 168 hours. Lipid Profile: No results for input(s): CHOL, HDL, LDLCALC, TRIG, CHOLHDL, LDLDIRECT in the last 72 hours. Thyroid Function Tests: No results for input(s): TSH, T4TOTAL, FREET4, T3FREE, THYROIDAB in the last 72 hours. Anemia Panel: No results for input(s): VITAMINB12, FOLATE, FERRITIN, TIBC, IRON, RETICCTPCT in the last 72 hours. Sepsis Labs: Recent Labs  Lab 02/28/19 0140 02/28/19 0406  LATICACIDVEN 4.8* 1.7    Recent Results (from the past 240 hour(s))  Blood Culture (routine x 2)     Status: None (Preliminary result)   Collection Time: 02/28/19  1:50 AM  Result Value Ref Range Status   Specimen Description BLOOD RIGHT ARM  Final   Special Requests   Final    BOTTLES DRAWN AEROBIC AND ANAEROBIC Blood Culture adequate volume   Culture   Final    NO GROWTH 1 DAY Performed at [facility], [address] [place], [place] [address]    Report Status PENDING  Incomplete  Blood Culture (routine x 2)     Status: None (Preliminary result)   Collection Time: 02/28/19 12:16 PM  Result Value Ref Range Status   Specimen Description BLOOD RIGHT HAND  Final   Special Requests   Final    BOTTLES DRAWN AEROBIC ONLY Blood Culture results may not be optimal due to an inadequate volume of blood received in culture bottles   Culture   Final    NO GROWTH < 24 HOURS Performed at [facility], [place] [address] [place], [place] [address]    Report Status PENDING  Incomplete         Radiology Studies: Dg Chest Port 1 View  Result Date: 03/01/2019 CLINICAL DATA:  Pulmonary infiltrates EXAM: PORTABLE CHEST 1 VIEW COMPARISON:  02/28/2019 FINDINGS: Previously seen interstitial prominence throughout the lungs has improved. Mild residual interstitial prominence in the left mid and lower lung which could reflect residual asymmetric edema or atypical infection. No effusions. Heart is normal size. Aortic atherosclerosis. No acute bony abnormality. IMPRESSION: Improving interstitial prominence throughout the lungs with mild residual interstitial prominence in the left mid and lower lung. Electronically Signed   By: Rolm Baptise M.D.   On: 03/01/2019 07:49   Dg Chest Port 1 View  Result Date: 02/28/2019 CLINICAL DATA:  Shortness of breath.  Decreased oxygen saturation. EXAM: PORTABLE CHEST 1 VIEW COMPARISON:  11/11/2018. FINDINGS: The heart size is normal. There is aortic atherosclerosis identified. Blunting of left costophrenic angle identified which may reflect a small left pleural effusion. Diffuse bilateral reticular interstitial opacities are identified. No lobar consolidation identified. IMPRESSION: 1. Diffuse bilateral reticular interstitial opacities are identified. Findings may reflect pulmonary edema and/or atypical infection. Clinical correlation advised. 2. Suspect small left pleural  effusion 3.  Aortic Atherosclerosis (ICD10-I70.0). Electronically Signed   By: Kerby Moors M.D.   On: 02/28/2019 02:07   Dg Foot 2 Views Left  Result Date: 02/28/2019 CLINICAL DATA:  Chronic left foot pain. The patient is currently being treated for left foot infection. EXAM: LEFT FOOT - 2 VIEW COMPARISON:  Plain films left foot 11/14/2018. FINDINGS: Bones are osteopenic. No bony destructive change or periosteal reaction to suggest osteomyelitis. No fracture or dislocation. No soft tissue gas or radiopaque foreign body. Atherosclerosis noted. IMPRESSION: No acute abnormality. Osteopenia. Electronically Signed  By: Inge Rise M.D.   On: 02/28/2019 09:09        Scheduled Meds:  aspirin EC  81 mg Oral Daily   Chlorhexidine Gluconate Cloth  6 each Topical Q0600   cinacalcet  90 mg Oral Q supper   clopidogrel  75 mg Oral Daily   [START ON 03/02/2019] doxercalciferol  2 mcg Intravenous Q M,W,F-HD   feeding supplement (ENSURE ENLIVE)  237 mL Oral TID BM   feeding supplement (NEPRO CARB STEADY)  237 mL Oral BID BM   gabapentin  300 mg Oral QHS   heparin  5,000 Units Subcutaneous Q8H   sevelamer carbonate  1,600 mg Oral TID WC   umeclidinium bromide  1 puff Inhalation Daily   vancomycin  500 mg Intravenous Q M,W,F-HD   Continuous Infusions:  sodium chloride     sodium chloride     ceFEPime (MAXIPIME) IV 1 g (02/28/19 2016)     LOS: 1 day    Time spent: 35 mins.More than 50% of that time was spent in counseling and/or coordination of care.      Shelly Coss, MD [facility] Pager [phone]  If 7PM-7AM, please contact night-coverage [email or web address] Password [email or web address] 03/01/2019, 11:38 AM

## 2019-03-01 NOTE — Op Note (Signed)
    NAME: Trevor [name]    MRN: [id number] DOB: Apr 17, 1946    DATE OF OPERATION: 03/01/2019  PREOP DIAGNOSIS:    Gangrene left foot  POSTOP DIAGNOSIS:    Same  PROCEDURE:    Left above-the-knee amputation  SURGEON: Judeth Cornfield. Scot Dock, MD, FACS  ASSIST: None  ANESTHESIA: General  EBL: Minimal  INDICATIONS:    Trevor [name] is a 73 y.o. male who presented with a gangrenous left foot.  Of note he had a temperature and was on the rule out COVID-19 protocol.  FINDINGS:   Good bleeding at the above the knee level.  TECHNIQUE:   The patient was taken to the operating room using full COVID-19 precautions.  The patient received a general anesthetic.  The left leg was prepped and draped in usual sterile fashion.  A tourniquet was placed on the upper thigh.  A fishmouth incision was marked above the level of the patella.  The leg was exsanguinated with an Esmarch bandage.  The incision was carried down to the skin, subcutaneous tissue, fascia, and muscle to the femur which was dissected free circumferentially.  The periosteum was elevated and the bone divided proximal to the level of skin division.  Femoral artery and vein were individually suture ligated with 2-0 silk sutures.  The tourniquet was then released.  Additional hemostasis was obtained using electrocautery and 2-0 silk sutures.  The edges of the bone were rasped.  The wound was irrigated with saline.  The fascial layer was closed with interrupted 2-0 Vicryl's.  The skin was closed with staples.  Sterile dressing was applied.  The patient tolerated the procedure well.  All needle and sponge counts were correct.  Trevor Mayo, MD, FACS [facility]  DATE OF DICTATION:   03/01/2019

## 2019-03-02 ENCOUNTER — Encounter (HOSPITAL_COMMUNITY): Payer: Self-pay | Admitting: Vascular Surgery

## 2019-03-02 DIAGNOSIS — I96 Gangrene, not elsewhere classified: Secondary | ICD-10-CM

## 2019-03-02 LAB — CK: Total CK: 148 U/L (ref 49–397)

## 2019-03-02 LAB — BASIC METABOLIC PANEL
ANION GAP: 12 (ref 5–15)
BUN: 33 mg/dL — ABNORMAL HIGH (ref 8–23)
CO2: 25 mmol/L (ref 22–32)
Calcium: 8.2 mg/dL — ABNORMAL LOW (ref 8.9–10.3)
Chloride: 99 mmol/L (ref 98–111)
Creatinine, Ser: 5.49 mg/dL — ABNORMAL HIGH (ref 0.61–1.24)
GFR calc non Af Amer: 9 mL/min — ABNORMAL LOW (ref >60)
GFR, EST AFRICAN AMERICAN: 11 mL/min — AB (ref >60)
Glucose, Bld: 121 mg/dL — ABNORMAL HIGH (ref 70–99)
Potassium: 4.3 mmol/L (ref 3.5–5.1)
SODIUM: 136 mmol/L (ref 135–145)

## 2019-03-02 LAB — NOVEL CORONAVIRUS, NAA (HOSP ORDER, SEND-OUT TO REF LAB; TAT 18-24 HRS): SARS-CoV-2, NAA: NOT DETECTED

## 2019-03-02 LAB — CBC WITH DIFFERENTIAL/PLATELET
Abs Immature Granulocytes: 0.14 10*3/uL — ABNORMAL HIGH (ref 0.00–0.07)
Basophils Absolute: 0 10*3/uL (ref 0.0–0.1)
Basophils Relative: 0 %
Eosinophils Absolute: 0 10*3/uL (ref 0.0–0.5)
Eosinophils Relative: 0 %
HCT: 23.6 % — ABNORMAL LOW (ref 39.0–52.0)
Hemoglobin: 7.5 g/dL — ABNORMAL LOW (ref 13.0–17.0)
Immature Granulocytes: 1 %
Lymphocytes Relative: 9 %
Lymphs Abs: 1.1 10*3/uL (ref 0.7–4.0)
MCH: 27.5 pg (ref 26.0–34.0)
MCHC: 31.8 g/dL (ref 30.0–36.0)
MCV: 86.4 fL (ref 80.0–100.0)
Monocytes Absolute: 1.1 10*3/uL — ABNORMAL HIGH (ref 0.1–1.0)
Monocytes Relative: 9 %
Neutro Abs: 9.8 10*3/uL — ABNORMAL HIGH (ref 1.7–7.7)
Neutrophils Relative %: 81 %
Platelets: 192 10*3/uL (ref 150–400)
RBC: 2.73 MIL/uL — ABNORMAL LOW (ref 4.22–5.81)
RDW: 15.2 % (ref 11.5–15.5)
WBC: 12.2 10*3/uL — ABNORMAL HIGH (ref 4.0–10.5)
nRBC: 0 % (ref 0.0–0.2)

## 2019-03-02 LAB — GLUCOSE, CAPILLARY: Glucose-Capillary: 85 mg/dL (ref 70–99)

## 2019-03-02 LAB — MAGNESIUM: Magnesium: 2.3 mg/dL (ref 1.7–2.4)

## 2019-03-02 LAB — C-REACTIVE PROTEIN: CRP: 14.6 mg/dL — ABNORMAL HIGH (ref <1.0)

## 2019-03-02 MED ORDER — DOXERCALCIFEROL 4 MCG/2ML IV SOLN
INTRAVENOUS | Status: AC
  Filled 2019-03-02: qty 2

## 2019-03-02 NOTE — Progress Notes (Signed)
Daily Nursing Note  Introduced self to patient who endorsed no complaints at that time. L AKA evaluated appeared to be dressed with a scant amount of blood on ace wrap. AM medications given to patient. HD started around 10. Vancomycin given thereafter. Touched base with medical team pertaining to plan, no new respiratory issues remains on RA w/o distress. Awaiting PT/OT consultations. HD completed in late afternoon (-)1.5L, patient was noted to be confused thereafter and believed he had been kidnapped. His daughter, Maree Erie [phone] was called and he was reoriented. In early evening identified Covid-19 was negative. Patient will be transferred to a telemetry unit.    Test - COVID-19 negative  Discharge Plan - [place] at Discharge

## 2019-03-02 NOTE — Progress Notes (Addendum)
Initial Nutrition Assessment   RD working remotely  [place]:   Not applicable  INTERVENTION:    Nepro Shake po BID, each supplement provides 425 kcal and 19 grams protein  NUTRITION DIAGNOSIS:   Increased nutrient needs related to post-op healing, chronic illness as evidenced by estimated needs  GOAL:   Patient will meet greater than or equal to 90% of their needs  MONITOR:   PO intake, Supplement acceptance, Labs, Skin, Weight trends, I & O's  REASON FOR ASSESSMENT:   Malnutrition Screening Tool  ASSESSMENT:   73 yo Male with PMH of ESRD on dialysis, COPD, possible diabetes, CHF, TIA ,hypertension, hyperlipidemia, peripheral vascular disease presented to the emergency department with complaints of fever, dyspnea.   Pt s/p procedures 3/31: LEFT ABOVE-THE-KNEE AMPUTATION  RD unable to obtain nutrition hx from pt at this time. He is currently being ruled out for COVID-19. Spoke with Sharyn Lull, RN.  RN reports pt was not very happy this AM as his breakfast was cold. He did however consume his french toast; does have the desire to eat. Pt is also legally blind and requires feeding assistance.  Medications include Renvela. Labs reviewed. BUN 33 (H). Cr 5.49 (H).  NUTRITION - FOCUSED PHYSICAL EXAM:  Unable to complete at this time, however, highly suspect malnutrition   Diet Order:   Diet Order            Diet renal/carb modified with fluid restriction Diet-HS Snack? Nothing; Fluid restriction: 1200 mL Fluid; Room service appropriate? Yes; Fluid consistency: Thin  Diet effective now             EDUCATION NEEDS:   Not appropriate for education at this time  Skin:  Skin Assessment: Reviewed RN Assessment  Last BM:  N/A    Intake/Output Summary (Last 24 hours) at 03/02/2019 1044 Last data filed at 03/02/2019 0800 Gross per 24 hour  Intake 520 ml  Output 50 ml  Net 470 ml   Height:   Ht Readings from Last 1 Encounters:  02/28/19 5\' 9"  (1.753  m)   Weight:   Wt Readings from Last 1 Encounters:  03/02/19 56.8 kg   BMI:  20.3 kg/m2 >> adjusted for AKA  Estimated Nutritional Needs:   Kcal:  1600-1800  Protein:  80-95 gm  Fluid:  1,000 ml + UOP  Arthur Holms, RD, LDN Pager #: [phone] After-Hours Pager #: [phone]

## 2019-03-02 NOTE — Progress Notes (Signed)
[facility] Progress Note  Subjective: sp L AKA yesterday.   Vitals:   03/02/19 1215 03/02/19 1230 03/02/19 1245 03/02/19 1300  BP: 123/67 110/60 105/60 97/61  Pulse: 77 74 73 74  Resp:      Temp:      TempSrc:      SpO2:      Weight:      Height:        Inpatient medications: . aspirin EC  81 mg Oral Daily  . Chlorhexidine Gluconate Cloth  6 each Topical Q0600  . cinacalcet  90 mg Oral Q supper  . clopidogrel  75 mg Oral Daily  . docusate sodium  100 mg Oral Daily  . doxercalciferol  2 mcg Intravenous Q M,W,F-HD  . feeding supplement (NEPRO CARB STEADY)  237 mL Oral BID BM  . gabapentin  300 mg Oral QHS  . heparin  5,000 Units Subcutaneous Q8H  . metoprolol tartrate  12.5 mg Oral BID  . pantoprazole  40 mg Oral Daily  . sevelamer carbonate  1,600 mg Oral TID WC  . umeclidinium bromide  1 puff Inhalation Daily  . vancomycin  500 mg Intravenous Q M,W,F-HD   . ceFEPime (MAXIPIME) IV 1 g (03/01/19 2137)   acetaminophen **OR** acetaminophen, alteplase, fentaNYL (SUBLIMAZE) injection, guaiFENesin-dextromethorphan, hydrALAZINE, Ipratropium-Albuterol, labetalol, lidocaine (PF), lidocaine-prilocaine, metoprolol tartrate, ondansetron, oxyCODONE, pentafluoroprop-tetrafluoroeth, phenol, traMADol  Iron/TIBC/Ferritin/ %Sat    Component Value Date/Time   IRON 46 03/30/2018 0343   TIBC 193 (L) 03/30/2018 0343   FERRITIN 1,290 (H) 03/30/2018 0343   IRONPCTSAT 24 03/30/2018 0343    Exam: Pt not examined directly today due to COVID-19 isolation   Home meds:  - aspirin 81/ clopidogrel 75 qd/ atorvastatin 80 isosorbide mononitrate 30 qd/ sl ntg prn  - sevelamer carb ac tid/ cinacalcet 90 hs  - hydrocodone -aceta prn/ mirtazapine 7.5hs/ gabapentin 300 hs/ voltaren gel prn  - ipratropium-albuterol tid prn  - prn's/ vitamins/ supplements   MWF [facility]  4h   56.5kg   400/A1.5    2/2 bath  LUE AVF  Hep none - hect  1 ug  - mircera 150 ug every 2 wks, last 3/23  - pth  68, ferr 1472, tsat 9% (prior 30%)   Assessment: 1. SOB/ hypoxemia - CXR cleared after HD Monday. Suspect vol / pulm edema now resolved.  2. Gangrene L foot - sp L AKA 3/31 3. ESRD on HD MWF. HD today, lower edw as tolerated 4. Anemia CKD - Hb down 7.5, next esa due on 4/4, will check fe/ tibc 5. CAD - presumed, medical Rx, unable to cath last Dec due to calcified vessels, had diffuse ST depression on EKG's at the time.  6. H/o CVA 7. H/o blindness 8. HTN - not on any bp meds at this time 9. COPD gold stage III 10. DM2 - not on insulin at home       [facility] 03/02/2019, 1:45 PM  Recent Labs  Lab 02/28/19 0140 03/01/19 1004 03/02/19 0421  NA 139 138 136  K 5.1 3.7 4.3  CL 94* 97* 99  CO2 25 26 25   GLUCOSE 123* 78 121*  BUN 47* 20 33*  CREATININE 9.15* 4.36* 5.49*  CALCIUM 9.6 8.3* 8.2*  ALBUMIN 2.6* 2.2*  --    Recent Labs  Lab 02/28/19 0140 03/01/19 1004  AST 27 19  ALT 13 11  ALKPHOS 87 61  BILITOT 1.0 1.1  PROT 7.7 6.4*  Recent Labs  Lab 03/01/19 1004 03/02/19 0627  WBC 9.4 12.2*  NEUTROABS 7.1 9.8*  HGB 8.0* 7.5*  HCT 24.8* 23.6*  MCV 87.0 86.4  PLT 183 192

## 2019-03-02 NOTE — Progress Notes (Signed)
Patient transferred to 5W-05 from 2W. Patient alert and oriented x4. Does not have IV access at this time. 2W RN placed IV consult. Tele box 10 in place. Compression wrap over left AKA stump, also has blanchable redness to sacrum. Foam dressing placed. Will continue to monitor and treat per MD orders.

## 2019-03-02 NOTE — Progress Notes (Signed)
OT Note  Pt having CVVHD this pm. Pt from [name] ([facility]) and plan is to return to [name]. Discussed with Nsg and left message for CM. Spoke with pt over the phone and he was apparently confused, thinking he had been "kidnapped". Nsg called and notified of confusion. Recommend nsg dangle pt EOB and mobilize to chair as able. If pt has difficulty with mobilizing, please contact acute care at number below.  Thank you Maurie Boettcher, OT/L   Acute OT Clinical Specialist Acute Rehabilitation Services Pager [phone] Office [phone]

## 2019-03-02 NOTE — Consult Note (Signed)
   [facility] CM Inpatient Consult   03/02/2019  Trevor [name] 24-Oct-1946 [id number]   Patient screened for Extreme high risk score of 46% for unplanned readmissions  And 3 hospitalizations in the past 6 months.Patient is a long term resident from [name] [[facility] since last review] and now s/p Above the knee amputation. Medicare/Next Gen plan. Disposition is not written at this time. If returning as a LTC resident of a facility, [facility] services will not be warranted.  Please place a [facility] Care Management consult or for questions contact:    Natividad Brood, RN BSN [facility] Liaison  [phone] business mobile phone Toll free office [phone]

## 2019-03-02 NOTE — Progress Notes (Signed)
PROGRESS NOTE    Trevor [name]  YJE:[id number] DOB: 09-11-1946 DOA: 02/28/2019 PCP: [name]   Brief Narrative: Patient is a 73 year old African-American, current resident of [name], with history of coronary artery disease, ESRD on MWF HD, COPD, possible diabetes, CHF, TIA ,hypertension, hyperlipidemia, peripheral vascular disease presented to the emergency department with complaints of fever and dyspnea. Initially was very hypoxic on presentation. CXR showed Diffuse bilateral reticular interstitial opacities suggesting  pulmonary edema and/or atypical infection.  Patient also had chronic worsening necrosis of his left toes.  Gangrene suspected.  Started on broad-spectrum antibiotics for possible sepsis.  Nephrology, vascular surgery consulted.  Covid-19 also suspected because of chest x-ray abnormalities and hypoxia so testing sent.  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  Nephrology consulted for dialysis needs.  Assessment & Plan:   Principal Problem:   Acute respiratory failure with hypoxia (HCC) Active Problems:   Tobacco abuse   ESRD (end stage renal disease) (HCC)   Abnormal CXR   Marijuana abuse   Congestive heart failure (CHF) (HCC)   Cellulitis   Sepsis ([place])  Acute hypoxic respiratory failure:  Most likely secondary to pulmonary edema in an HD patient.   Improved after HD and hypoxia resolved.  Currently saturating 100% on room air. Chest x-ray also shows improvement in pulmonary edema. Low suspicion for COVID-19, tested and negative today.  Discontinued all isolations.  ESRD on dialysis:  Nephrology following.  He has AV graft on the left arm. Underwent HD on 3/30 and 4/1.  Suspected sepsis:  Presented with leukocytosis.  Lactic acidosis of 4.8.  Elevated CRP.  Sepsis suspected on presentation.  Most likely secondary to left foot gangrene.  Started on broad-spectrum antibiotics.  Lactic acid has normalized.  Patient on empiric vancomycin and cefepime.   Now that source of sepsis has been removed i.e. left AKA, discussed with vascular surgery and consider stopping antibiotics soon.  Left foot gangrene:  Has history of peripheral vascular disease.  Follows with vascular surgery as an outpatient.  Has history of axillary to bifemoral bypass grafting. vascular surgery following here. 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  Await VS follow-up.  History of coronary artery disease:  Denies any chest pain at present.  Continue his home medications.  On aspirin, Plavix and statin at home.  Poor candidate for revascularization.  COPD Gold stage III/active smoker:  Counseled for smoking cessation.  Currently COPD status stable.  Continue home inhaler  History of stroke/TIA:  Continue aspirin, Plavix and statin after surgery.  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  Follow CBC in a.m. and transfuse if hemoglobin less than 7.  Next ESA due 4/4.  Nephrology checking iron stores.  Essential hypertension Not on meds PTA.   PPE: Patient: None.  Provider: Head cover, CAPR, gown, gloves and shoe cover DVT prophylaxis:Heparin [place] Code Status: Full Family Communication: None present at the bedside Disposition Plan: Possible DC back to [name] pending PT input and VVS clearance,?  4/2.   Consultants:  Vascular surgery,  Nephrology  Procedures:  Dialysis Left AKA 3/31  Antimicrobials:  Anti-infectives (From admission, onward)   Start     Dose/Rate Route Frequency Ordered Stop   03/01/19 1330  ceFAZolin (ANCEF) IVPB 2g/100 mL premix  Status:  Discontinued     2 g 200 mL/hr over 30 Minutes Intravenous Every 8 hours 03/01/19 1324 03/01/19 1357   02/28/19 2200  ceFEPIme (MAXIPIME) 1 g in sodium chloride 0.9 % 100  mL IVPB     1 g 200 mL/hr over 30 Minutes Intravenous Every 24 hours 02/28/19 0540     02/28/19 1200  vancomycin (VANCOCIN) IVPB 500 mg/100 ml premix     500 mg 100 mL/hr over 60 Minutes Intravenous Every M-W-F (Hemodialysis)  02/28/19 0540     02/28/19 0215  vancomycin (VANCOCIN) 1,250 mg in sodium chloride 0.9 % 250 mL IVPB     1,250 mg 166.7 mL/hr over 90 Minutes Intravenous  Once 02/28/19 0202 02/28/19 0500   02/28/19 0200  ceFEPIme (MAXIPIME) 2 g in sodium chloride 0.9 % 100 mL IVPB     2 g 200 mL/hr over 30 Minutes Intravenous  Once 02/28/19 0157 02/28/19 0246   02/28/19 0200  metroNIDAZOLE (FLAGYL) IVPB 500 mg     500 mg 100 mL/hr over 60 Minutes Intravenous  Once 02/28/19 0157 02/28/19 0320   02/28/19 0200  vancomycin (VANCOCIN) IVPB 1000 mg/200 mL premix  Status:  Discontinued     1,000 mg 200 mL/hr over 60 Minutes Intravenous  Once 02/28/19 0157 02/28/19 0202      Subjective: Patient reports that he has been at the current [name] for approximately 1-1/2 years, moves around with the help of a wheelchair, not on home oxygen, undergoes HD MWF.  States that he slept well last night.  Denies pain including in operated left AKA.  No dyspnea, chest pain or fevers.  Objective: Vitals:   03/02/19 1300 03/02/19 1330 03/02/19 1348 03/02/19 1543  BP: 97/61 (!) 92/50 120/70 (!) 121/101  Pulse: 74 72 70 69  Resp:    16  Temp:   98 F (36.7 C) (!) 97.1 F (36.2 C)  TempSrc:   Oral Axillary  SpO2:   100% 100%  Weight:   55 kg   Height:        Intake/Output Summary (Last 24 hours) at 03/02/2019 1642 Last data filed at 03/02/2019 1348 Gross per 24 hour  Intake 220 ml  Output 1500 ml  Net -1280 ml   Filed Weights   02/28/19 1759 03/02/19 0948 03/02/19 1348  Weight: 56.5 kg 56.8 kg 55 kg    Examination:  General exam: Pleasant elderly male, small built and nourished lying comfortably propped up in bed undergoing HD at bedside. Respiratory system: Clear to auscultation.  No increased work of breathing. Cardiovascular system: S1 & S2 heard, RRR. No JVD, murmurs, rubs, gallops or clicks. No pedal edema.  Telemetry personally reviewed: SR with BBB morphology.  5 beat NSVT noted. Gastrointestinal system:  Abdomen is nondistended, soft and nontender. No organomegaly or masses felt. Normal bowel sounds heard. Central nervous system: Alert and oriented x2. No focal neurological deficits. Extremities: Undergoing HD via LUE AVG.  Left AKA dressing clean and dry.    Data Reviewed: I have personally reviewed following labs and imaging studies  CBC: Recent Labs  Lab 02/28/19 0140 03/01/19 1004 03/02/19 0627  WBC 14.0* 9.4 12.2*  NEUTROABS 11.2* 7.1 9.8*  HGB 10.9* 8.0* 7.5*  HCT 36.8* 24.8* 23.6*  MCV 91.1 87.0 86.4  PLT 236 183 299   Basic Metabolic Panel: Recent Labs  Lab 02/28/19 0140 03/01/19 0655 03/01/19 1004 03/02/19 0421  NA 139  --  138 136  K 5.1  --  3.7 4.3  CL 94*  --  97* 99  CO2 25  --  26 25  GLUCOSE 123*  --  78 121*  BUN 47*  --  20 33*  CREATININE 9.15*  --  4.36* 5.49*  CALCIUM 9.6  --  8.3* 8.2*  MG  --  2.2  --  2.3   GFR: Estimated Creatinine Clearance: 9.3 mL/min (A) (by C-G formula based on SCr of 5.49 mg/dL (H)). Liver Function Tests: Recent Labs  Lab 02/28/19 0140 03/01/19 1004  AST 27 19  ALT 13 11  ALKPHOS 87 61  BILITOT 1.0 1.1  PROT 7.7 6.4*  ALBUMIN 2.6* 2.2*   Cardiac Enzymes: Recent Labs  Lab 02/28/19 0140 03/01/19 0655 03/02/19 0421  CKTOTAL  --  73 148  TROPONINI 0.20*  --   --    Sepsis Labs: Recent Labs  Lab 02/28/19 0140 02/28/19 0406  LATICACIDVEN 4.8* 1.7    Recent Results (from the past 240 hour(s))  Blood Culture (routine x 2)     Status: None (Preliminary result)   Collection Time: 02/28/19  1:50 AM  Result Value Ref Range Status   Specimen Description BLOOD RIGHT ARM  Final   Special Requests   Final    BOTTLES DRAWN AEROBIC AND ANAEROBIC Blood Culture adequate volume   Culture   Final    NO GROWTH 2 DAYS Performed at [facility], [place] [address], [place], [place] [address]    Report Status PENDING  Incomplete  Blood Culture (routine x 2)     Status: None (Preliminary result)   Collection Time:  02/28/19 12:16 PM  Result Value Ref Range Status   Specimen Description BLOOD RIGHT HAND  Final   Special Requests   Final    BOTTLES DRAWN AEROBIC ONLY Blood Culture results may not be optimal due to an inadequate volume of blood received in culture bottles   Culture   Final    NO GROWTH 2 DAYS Performed at [facility], [place] [address], [place], [place] [address]    Report Status PENDING  Incomplete  Novel Coronavirus, NAA (hospital order; send-out to ref lab)     Status: None   Collection Time: 02/28/19  1:40 PM  Result Value Ref Range Status   SARS-CoV-2, NAA NOT DETECTED NOT DETECTED Final    Comment: Negative (Not Detected) results do not exclude infection caused by SARS CoV 2 and should not be used as the sole basis for treatment or other patient management decisions. Optimum specimen types and timing for peak viral levels during infections caused  by SARS CoV 2 have not been determined. Collection of multiple specimens (types and time points) from the same patient may be necessary to detect the virus. Improper specimen collection and handling, sequence variability underlying assay primers and or probes, or the presence of organisms in  quantities less than the limit of detection of the assay may lead to false negative results. Positive and negative predictive values of testing are highly dependent on prevalence. False negative results are more likely when prevalence of disease is high. (NOTE) The expected result is Negative (Not Detected). The SARS CoV 2 test is intended for the presumptive qualitative  detection of nucleic acid from SARS CoV 2 in upper and lower  respir atory specimens. Testing methodology is real time RT PCR. Test results must be correlated with clinical presentation and  evaluated in the context of other laboratory and epidemiologic data.  Test performance can be affected because the epidemiology and  clinical spectrum of infection caused by SARS CoV 2 is  not fully  known. For example, the optimum types of specimens to collect and  when during the course of  infection these specimens are most likely  to contain detectable viral RNA may not be known. This test has not been Food and Drug Administration (FDA) cleared or  approved and has been authorized by FDA under an Emergency Use  Authorization (EUA). The test is only authorized for the duration of  the declaration that circumstances exist justifying the authorization  of emergency use of in vitro diagnostic tests for detection and or  diagnosis of SARS CoV 2 under Section 564(b)(1) of the Act, 21 U.S.C.  section [phone] 3(b)(1), unless the authorization is terminated or   revoked sooner. [facility] is certified under the  Clinical Laboratory Improvement Amendments of 1988 (CLIA), 42 U.S.C.  section [phone], to perform high complexity tests. Performed at [facility] [id number] [address], [address], [place], [place] [address] Laboratory Director: Loleta Books, MD    Coronavirus Source NASOPHARYNGEAL  Final    Comment: Performed at [facility], [place] [address], [place], [place] [address]  Surgical pcr screen     Status: None   Collection Time: 03/01/19  9:53 AM  Result Value Ref Range Status   MRSA, PCR NEGATIVE NEGATIVE Final   Staphylococcus aureus NEGATIVE NEGATIVE Final    Comment: (NOTE) The Xpert SA Assay (FDA approved for NASAL specimens in patients 2 years of age and older), is one component of a comprehensive surveillance program. It is not intended to diagnose infection nor to guide or monitor treatment. Performed at [facility], [place] [address] [place], [place] [address]          Radiology Studies: Dg Chest Port 1 View  Result Date: 03/01/2019 CLINICAL DATA:  Pulmonary infiltrates EXAM: PORTABLE CHEST 1 VIEW COMPARISON:  02/28/2019 FINDINGS: Previously seen interstitial prominence throughout the  lungs has improved. Mild residual interstitial prominence in the left mid and lower lung which could reflect residual asymmetric edema or atypical infection. No effusions. Heart is normal size. Aortic atherosclerosis. No acute bony abnormality. IMPRESSION: Improving interstitial prominence throughout the lungs with mild residual interstitial prominence in the left mid and lower lung. Electronically Signed   By: Rolm Baptise M.D.   On: 03/01/2019 07:49        Scheduled Meds:  aspirin EC  81 mg Oral Daily   Chlorhexidine Gluconate Cloth  6 each Topical Q0600   cinacalcet  90 mg Oral Q supper   clopidogrel  75 mg Oral Daily   docusate sodium  100 mg Oral Daily   doxercalciferol  2 mcg Intravenous Q M,W,F-HD   feeding supplement (NEPRO CARB STEADY)  237 mL Oral BID BM   gabapentin  300 mg Oral QHS   heparin  5,000 Units Subcutaneous Q8H   metoprolol tartrate  12.5 mg Oral BID   pantoprazole  40 mg Oral Daily   sevelamer carbonate  1,600 mg Oral TID WC   umeclidinium bromide  1 puff Inhalation Daily   vancomycin  500 mg Intravenous Q M,W,F-HD   Continuous Infusions:  ceFEPime (MAXIPIME) IV 1 g (03/01/19 2137)     LOS: 2 days    Vernell Leep, MD, FACP, [facility]. [facility]  To contact the attending provider between 7A-7P or the covering provider during after hours 7P-7A, please log into the web site [email or web address] and access using universal  password for that web site. If you do not have the password, please call the hospital operator.

## 2019-03-02 NOTE — Evaluation (Signed)
Physical Therapy Evaluation Patient Details Name: Trevor [name] MRN: [id number] DOB: 1946/02/18 Today's Date: 03/02/2019   History of Present Illness  73 y.o. male admitted on 02/28/19 for SOB.  Pt dx with sepsis gangrene of L LE and suspected COVID-19 (test came back negative).  Pt s/p L AKA on 03/01/19.  Pt with other significant PMH of stroke, STEMI, HTN, ESRD on HD, CVA, CAD, CHF, anemai, AAA, bil endarterectomy femoral, axilla femoral bypass.    Clinical Impression  Pt was able to laterally transfer OOB to the drop arm recliner chair with min guard assist.  He is from [name] and plans to return there for further rehab.  His goal is to be able to walk with a prosthesis.   PT to follow acutely for deficits listed below.    Follow Up Recommendations [name];Other (comment)(from [facility])    Equipment Recommendations  3in1 (PT);Wheelchair (measurements PT);Wheelchair cushion (measurements PT);Rolling walker with 5" wheels;Other (comment)(drop arm 3-in-1)    Recommendations for Other Services    NA   Precautions / Restrictions Precautions Precautions: Fall      Mobility  Bed Mobility Overal bed mobility: Needs Assistance Bed Mobility: Supine to Sit     Supine to sit: Supervision     General bed mobility comments: supervision for safety  Transfers Overall transfer level: Needs assistance Equipment used: None Transfers: Lateral/Scoot Transfers          Lateral/Scoot Transfers: Min guard General transfer comment: Min guard assist for safety to laterally scoot to drop arm recliner chair from bed.           Balance Overall balance assessment: Needs assistance Sitting-balance support: Feet supported;Bilateral upper extremity supported Sitting balance-Leahy Scale: Fair   Postural control: Posterior lean                                   Pertinent Vitals/Pain Pain Assessment: Faces Faces Pain Scale: Hurts little more Pain Location: left  residual limb Pain Descriptors / Indicators: (phantom) Pain Intervention(s): Limited activity within patient's tolerance;Monitored during session;Repositioned(encouraged self massage)    Home Living Family/patient expects to be discharged to:: Skilled nursing facility                 Additional Comments: from [name] ([facility])    Prior Function Level of Independence: Independent with assistive device(s)         Comments: Pt reports he could transfer himself into and out of his WC, he got assistance from staff to bathe.         Extremity/Trunk Assessment   Upper Extremity Assessment Upper Extremity Assessment: Overall WFL for tasks assessed    Lower Extremity Assessment Lower Extremity Assessment: LLE deficits/detail LLE Deficits / Details: left leg with fresh incision, dressing intact with a small amount of dry blood.  Pt able to lift leg 3-/5 hip flexion against gravity.     Cervical / Trunk Assessment Cervical / Trunk Assessment: Normal  Communication   Communication: No difficulties  Cognition Arousal/Alertness: Awake/alert Behavior During Therapy: WFL for tasks assessed/performed Overall Cognitive Status: No family/caregiver present to determine baseline cognitive functioning                                 General Comments: Per RN he had an episode after HD where he was very confused, but a call from his  daughter re-oriented him.               Assessment/Plan    PT Assessment Patient needs continued PT services  PT Problem List Decreased strength;Decreased range of motion;Decreased activity tolerance;Decreased balance;Decreased mobility;Decreased knowledge of use of DME;Decreased knowledge of precautions;Pain       PT Treatment Interventions DME instruction;Gait training;Functional mobility training;Therapeutic activities;Therapeutic exercise;Balance training;Patient/family education;Wheelchair mobility training    PT Goals (Current goals  can be found in the Care Plan section)  Acute Rehab PT Goals Patient Stated Goal: wants to get a prosthetic leg and get to being mobile again to live on his own. PT Goal Formulation: With patient Time For Goal Achievement: 03/16/19 Potential to Achieve Goals: Good    Frequency Min 3X/week           AM-PAC PT "6 Clicks" Mobility  Outcome Measure Help needed turning from your back to your side while in a flat bed without using bedrails?: None Help needed moving from lying on your back to sitting on the side of a flat bed without using bedrails?: None Help needed moving to and from a bed to a chair (including a wheelchair)?: A Little Help needed standing up from a chair using your arms (e.g., wheelchair or bedside chair)?: A Lot Help needed to walk in hospital room?: Total Help needed climbing 3-5 steps with a railing? : Total 6 Click Score: 15    End of Session   Activity Tolerance: Patient limited by pain Patient left: in chair;with call bell/phone within reach;with chair alarm set Nurse Communication: Mobility status PT Visit Diagnosis: Muscle weakness (generalized) (M62.81);Difficulty in walking, not elsewhere classified (R26.2);Pain Pain - Right/Left: Left Pain - part of body: Leg    Time: 1725-1749 PT Time Calculation (min) (ACUTE ONLY): 24 min   Charges:          Wells Guiles B. Gianluca Chhim, PT, DPT  Acute Rehabilitation [phone] pager #[phone] office   PT Evaluation $PT Eval Moderate Complexity: 1 Mod PT Treatments $Therapeutic Activity: 8-22 mins        03/02/2019, 7:32 PM

## 2019-03-03 LAB — CBC
HCT: 20.6 % — ABNORMAL LOW (ref 39.0–52.0)
Hemoglobin: 6.6 g/dL — CL (ref 13.0–17.0)
MCH: 27.4 pg (ref 26.0–34.0)
MCHC: 32 g/dL (ref 30.0–36.0)
MCV: 85.5 fL (ref 80.0–100.0)
Platelets: 192 10*3/uL (ref 150–400)
RBC: 2.41 MIL/uL — ABNORMAL LOW (ref 4.22–5.81)
RDW: 15.1 % (ref 11.5–15.5)
WBC: 8.4 10*3/uL (ref 4.0–10.5)
nRBC: 0 % (ref 0.0–0.2)

## 2019-03-03 LAB — IRON AND TIBC
Iron: 54 ug/dL (ref 45–182)
Saturation Ratios: 35 % (ref 17.9–39.5)
TIBC: 155 ug/dL — ABNORMAL LOW (ref 250–450)
UIBC: 101 ug/dL

## 2019-03-03 LAB — CBC WITH DIFFERENTIAL/PLATELET
Abs Immature Granulocytes: 0.1 10*3/uL — ABNORMAL HIGH (ref 0.00–0.07)
Basophils Absolute: 0.1 10*3/uL (ref 0.0–0.1)
Basophils Relative: 1 %
Eosinophils Absolute: 0.5 10*3/uL (ref 0.0–0.5)
Eosinophils Relative: 5 %
HCT: 26 % — ABNORMAL LOW (ref 39.0–52.0)
Hemoglobin: 8.2 g/dL — ABNORMAL LOW (ref 13.0–17.0)
Immature Granulocytes: 1 %
Lymphocytes Relative: 19 %
Lymphs Abs: 1.7 10*3/uL (ref 0.7–4.0)
MCH: 26.8 pg (ref 26.0–34.0)
MCHC: 31.5 g/dL (ref 30.0–36.0)
MCV: 85 fL (ref 80.0–100.0)
Monocytes Absolute: 0.7 10*3/uL (ref 0.1–1.0)
Monocytes Relative: 8 %
Neutro Abs: 5.7 10*3/uL (ref 1.7–7.7)
Neutrophils Relative %: 66 %
Platelets: 183 10*3/uL (ref 150–400)
RBC: 3.06 MIL/uL — ABNORMAL LOW (ref 4.22–5.81)
RDW: 15 % (ref 11.5–15.5)
WBC: 8.7 10*3/uL (ref 4.0–10.5)
nRBC: 0 % (ref 0.0–0.2)

## 2019-03-03 LAB — PREPARE RBC (CROSSMATCH)

## 2019-03-03 LAB — MAGNESIUM: Magnesium: 2.3 mg/dL (ref 1.7–2.4)

## 2019-03-03 MED ORDER — CHLORHEXIDINE GLUCONATE CLOTH 2 % EX PADS
6.0000 | MEDICATED_PAD | Freq: Every day | CUTANEOUS | Status: DC
  Administered 2019-03-03 – 2019-03-06 (×4): 6 via TOPICAL

## 2019-03-03 MED ORDER — DARBEPOETIN ALFA 150 MCG/0.3ML IJ SOSY: 150.0000 ug | PREFILLED_SYRINGE | INTRAMUSCULAR | Status: DC

## 2019-03-03 MED ORDER — SODIUM CHLORIDE 0.9% IV SOLUTION
Freq: Once | INTRAVENOUS | Status: AC
  Administered 2019-03-03: 08:00:00 via INTRAVENOUS

## 2019-03-03 NOTE — Progress Notes (Signed)
PROGRESS NOTE    Trevor [name]  GBT:[id number] DOB: Sep 05, 1946 DOA: 02/28/2019 PCP: [name]   Brief Narrative: Patient is a 73 year old African-American, current resident of [name], with history of coronary artery disease, ESRD on MWF HD, COPD, possible diabetes, CHF, TIA ,hypertension, hyperlipidemia and peripheral vascular disease.  Patient was admitted with fever and dyspnea.  Initially, patient was very hypoxic, and CXR showed Diffuse bilateral reticular interstitial opacities suggestive of pulmonary edema and/or atypical infection.  Patient also had chronic worsening necrosis of his left toes.  Gangrene was suspected.  Patient was started on broad-spectrum antibiotics for possible sepsis.  Nephrology and vascular surgery teams were consulted.  Covid-19 also suspected because of chest x-ray abnormalities and hypoxia so testing was sent.  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  Nephrology team has been directing renal replacement therapy.   03/03/2019: Patient seen.  No new complaints.  Hemoglobin done earlier today was 6.6 g/dL.  Patient is currently being transfused with packed red blood cells.  Subcutaneous heparin is on hold.  Stool also sent for fecal occult blood.  Reviewing prior records, patient was fecal occult blood positive around 2019.  As per nephrology note, patient is on Mircera every 2 weeks, and the last dose was on 02/21/2019.  Patient is followed up by the nephrology team.  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  Further management will depend on hospital course.  Assessment & Plan:   Principal Problem:   Acute respiratory failure with hypoxia (HCC) Active Problems:   Tobacco abuse   ESRD (end stage renal disease) (HCC)   Abnormal CXR   Marijuana abuse   Congestive heart failure (CHF) (HCC)   Cellulitis   Sepsis ([place])  Acute hypoxic respiratory failure:  Most likely secondary to pulmonary  edema in an HD patient.   Improved after HD and hypoxia resolved.  Currently saturating 100% on room air. Chest x-ray also shows improvement in pulmonary edema. Low suspicion for COVID-19, tested and negative.    ESRD on dialysis:  Nephrology following.  He has AV graft on the left arm. Underwent HD on 3/30 and 4/1. 03/03/2019: Likely hemodialysis tomorrow.  Patient may need further blood transfusion with hemodialysis tomorrow, depending on posttransfusion his hemoglobin.  Also follow the result of stool occult blood (query iron status).  Suspected sepsis:  Presented with leukocytosis.  Lactic acidosis of 4.8.  Elevated CRP.  Sepsis suspected on presentation.  Most likely secondary to left foot gangrene.  Started on broad-spectrum antibiotics.  Lactic acid has normalized.  Patient on empiric vancomycin and cefepime.  Now that source of sepsis has been removed i.e. left AKA, discussed with vascular surgery and consider stopping antibiotics soon. 03/03/2019: Patient is still on IV vancomycin.  Will liaise with the vascular surgery team and decide when to discontinue antibiotics.  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  Left foot gangrene:  Has history of peripheral vascular disease.  Follows with vascular surgery as an outpatient.  Has history of axillary to bifemoral bypass grafting. vascular surgery following here. 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  Await VS follow-up.  History of coronary artery disease:  Denies any chest pain at present.  Continue his home medications.  On aspirin, Plavix and statin at home.  Poor candidate for revascularization. 03/03/2019: No chest pain or shortness of breath reported at this time.  COPD Gold stage III/active smoker:  Counseled for smoking cessation.  Currently COPD status  stable.  Continue home inhaler  History of stroke/TIA:  Continue aspirin, Plavix and statin after surgery.  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  Follow CBC in a.m.  and transfuse if hemoglobin less than 7.  Next ESA due 4/4.  Nephrology checking iron stores.  Essential hypertension Not on meds PTA.  DVT prophylaxis: Heparin [place] discontinued due to hemoglobin of 6.6 g/dL. Code Status: Full Family Communication: None present at the bedside Disposition Plan: Likely DC back to skilled nursing facility when optimized.  Vascular surgery team to also cleared patient prior to discharge.    Consultants:  Vascular surgery,  Nephrology  Procedures:  Dialysis Left AKA 3/31  Antimicrobials:  Anti-infectives (From admission, onward)   Start     Dose/Rate Route Frequency Ordered Stop   03/01/19 1330  ceFAZolin (ANCEF) IVPB 2g/100 mL premix  Status:  Discontinued     2 g 200 mL/hr over 30 Minutes Intravenous Every 8 hours 03/01/19 1324 03/01/19 1357   02/28/19 2200  ceFEPIme (MAXIPIME) 1 g in sodium chloride 0.9 % 100 mL IVPB     1 g 200 mL/hr over 30 Minutes Intravenous Every 24 hours 02/28/19 0540     02/28/19 1200  vancomycin (VANCOCIN) IVPB 500 mg/100 ml premix     500 mg 100 mL/hr over 60 Minutes Intravenous Every M-W-F (Hemodialysis) 02/28/19 0540     02/28/19 0215  vancomycin (VANCOCIN) 1,250 mg in sodium chloride 0.9 % 250 mL IVPB     1,250 mg 166.7 mL/hr over 90 Minutes Intravenous  Once 02/28/19 0202 02/28/19 0500   02/28/19 0200  ceFEPIme (MAXIPIME) 2 g in sodium chloride 0.9 % 100 mL IVPB     2 g 200 mL/hr over 30 Minutes Intravenous  Once 02/28/19 0157 02/28/19 0246   02/28/19 0200  metroNIDAZOLE (FLAGYL) IVPB 500 mg     500 mg 100 mL/hr over 60 Minutes Intravenous  Once 02/28/19 0157 02/28/19 0320   02/28/19 0200  vancomycin (VANCOCIN) IVPB 1000 mg/200 mL premix  Status:  Discontinued     1,000 mg 200 mL/hr over 60 Minutes Intravenous  Once 02/28/19 0157 02/28/19 0202      Subjective: Patient reports that he has been at the current [name] for approximately 1-1/2 years, moves around with the help of a wheelchair, not on home oxygen,  undergoes HD MWF.  States that he slept well last night.  Denies pain including in operated left AKA.  No dyspnea, chest pain or fevers.  Objective: Vitals:   03/03/19 0503 03/03/19 0759 03/03/19 0831 03/03/19 0925  BP: (!) 123/56 (!) 135/56 (!) 149/61 (!) 147/58  Pulse: 60 64 66 65  Resp: 18 16  18   Temp: 97.9 F (36.6 C) 97.7 F (36.5 C) (!) 97.5 F (36.4 C)   TempSrc: Oral Oral Oral   SpO2: 100% 97%  100%  Weight: 52.3 kg     Height:        Intake/Output Summary (Last 24 hours) at 03/03/2019 1012 Last data filed at 03/03/2019 0551 Gross per 24 hour  Intake 150 ml  Output 1500 ml  Net -1350 ml   Filed Weights   03/02/19 1348 03/02/19 1828 03/03/19 0503  Weight: 55 kg 51.4 kg 52.3 kg    Examination:  General exam: Not in any distress.  Patient is awake and alert.   Respiratory system: Clear to auscultation.   Cardiovascular system: S1 & S2 heard. Gastrointestinal system: Abdomen is nondistended, soft and nontender. No organomegaly  or masses felt. Normal bowel sounds heard. Central nervous system: Alert and oriented x2.  Query mild facial asymmetry.  Patient moves all extremities.   Extremities: Minimal right ankle fullness.  Status post left AKA.  Undergoing HD via LUE AVG.    Data Reviewed: I have personally reviewed following labs and imaging studies  CBC: Recent Labs  Lab 02/28/19 0140 03/01/19 1004 03/02/19 0627 03/03/19 0248  WBC 14.0* 9.4 12.2* 8.4  NEUTROABS 11.2* 7.1 9.8*  --   HGB 10.9* 8.0* 7.5* 6.6*  HCT 36.8* 24.8* 23.6* 20.6*  MCV 91.1 87.0 86.4 85.5  PLT 236 183 192 010   Basic Metabolic Panel: Recent Labs  Lab 02/28/19 0140 03/01/19 0655 03/01/19 1004 03/02/19 0421 03/03/19 0248  NA 139  --  138 136  --   K 5.1  --  3.7 4.3  --   CL 94*  --  97* 99  --   CO2 25  --  26 25  --   GLUCOSE 123*  --  78 121*  --   BUN 47*  --  20 33*  --   CREATININE 9.15*  --  4.36* 5.49*  --   CALCIUM 9.6  --  8.3* 8.2*  --   MG  --  2.2  --  2.3 2.3    GFR: Estimated Creatinine Clearance: 8.9 mL/min (A) (by C-G formula based on SCr of 5.49 mg/dL (H)). Liver Function Tests: Recent Labs  Lab 02/28/19 0140 03/01/19 1004  AST 27 19  ALT 13 11  ALKPHOS 87 61  BILITOT 1.0 1.1  PROT 7.7 6.4*  ALBUMIN 2.6* 2.2*   Cardiac Enzymes: Recent Labs  Lab 02/28/19 0140 03/01/19 0655 03/02/19 0421  CKTOTAL  --  73 148  TROPONINI 0.20*  --   --    Sepsis Labs: Recent Labs  Lab 02/28/19 0140 02/28/19 0406  LATICACIDVEN 4.8* 1.7    Recent Results (from the past 240 hour(s))  Blood Culture (routine x 2)     Status: None (Preliminary result)   Collection Time: 02/28/19  1:50 AM  Result Value Ref Range Status   Specimen Description BLOOD RIGHT ARM  Final   Special Requests   Final    BOTTLES DRAWN AEROBIC AND ANAEROBIC Blood Culture adequate volume   Culture   Final    NO GROWTH 2 DAYS Performed at [facility], [place] [address], [place], [place] [address]    Report Status PENDING  Incomplete  Blood Culture (routine x 2)     Status: None (Preliminary result)   Collection Time: 02/28/19 12:16 PM  Result Value Ref Range Status   Specimen Description BLOOD RIGHT HAND  Final   Special Requests   Final    BOTTLES DRAWN AEROBIC ONLY Blood Culture results may not be optimal due to an inadequate volume of blood received in culture bottles   Culture   Final    NO GROWTH 2 DAYS Performed at [facility], [place] [address] [place], [place] [address]    Report Status PENDING  Incomplete  Novel Coronavirus, NAA (hospital order; send-out to ref lab)     Status: None   Collection Time: 02/28/19  1:40 PM  Result Value Ref Range Status   SARS-CoV-2, NAA NOT DETECTED NOT DETECTED Final    Comment: Negative (Not Detected) results do not exclude infection caused by SARS CoV 2 and should not be used as the sole basis for treatment or other patient management  decisions. Optimum specimen types and timing for peak viral levels during  infections caused  by SARS CoV 2 have not been determined. Collection of multiple specimens (types and time points) from the same patient may be necessary to detect the virus. Improper specimen collection and handling, sequence variability underlying assay primers and or probes, or the presence of organisms in  quantities less than the limit of detection of the assay may lead to false negative results. Positive and negative predictive values of testing are highly dependent on prevalence. False negative results are more likely when prevalence of disease is high. (NOTE) The expected result is Negative (Not Detected). The SARS CoV 2 test is intended for the presumptive qualitative  detection of nucleic acid from SARS CoV 2 in upper and lower  respir atory specimens. Testing methodology is real time RT PCR. Test results must be correlated with clinical presentation and  evaluated in the context of other laboratory and epidemiologic data.  Test performance can be affected because the epidemiology and  clinical spectrum of infection caused by SARS CoV 2 is not fully  known. For example, the optimum types of specimens to collect and  when during the course of infection these specimens are most likely  to contain detectable viral RNA may not be known. This test has not been Food and Drug Administration (FDA) cleared or  approved and has been authorized by FDA under an Emergency Use  Authorization (EUA). The test is only authorized for the duration of  the declaration that circumstances exist justifying the authorization  of emergency use of in vitro diagnostic tests for detection and or  diagnosis of SARS CoV 2 under Section 564(b)(1) of the Act, 21 U.S.C.  section [phone] 3(b)(1), unless the authorization is terminated or   revoked sooner. [facility] is certified under the  Clinical Laboratory Improvement Amendments of 1988 (CLIA), 42 U.S.C.  section [phone], to perform high  complexity tests. Performed at [facility] [id number], [place], [place] [address] Laboratory Director: Loleta Books, MD    Coronavirus Source NASOPHARYNGEAL  Final    Comment: Performed at [facility], [place] [address] [place], [place] [address]  Surgical pcr screen     Status: None   Collection Time: 03/01/19  9:53 AM  Result Value Ref Range Status   MRSA, PCR NEGATIVE NEGATIVE Final   Staphylococcus aureus NEGATIVE NEGATIVE Final    Comment: (NOTE) The Xpert SA Assay (FDA approved for NASAL specimens in patients 50 years of age and older), is one component of a comprehensive surveillance program. It is not intended to diagnose infection nor to guide or monitor treatment. Performed at [facility], [place] [address] [place], [place] [address]          Radiology Studies: No results found.      Scheduled Meds:  sodium chloride   Intravenous Once   aspirin EC  81 mg Oral Daily   Chlorhexidine Gluconate Cloth  6 each Topical Q0600   cinacalcet  90 mg Oral Q supper   clopidogrel  75 mg Oral Daily   docusate sodium  100 mg Oral Daily   doxercalciferol  2 mcg Intravenous Q M,W,F-HD   feeding supplement (NEPRO CARB STEADY)  237 mL Oral BID BM   gabapentin  300 mg Oral QHS   metoprolol tartrate  12.5 mg Oral BID   pantoprazole  40 mg Oral Daily  sevelamer carbonate  1,600 mg Oral TID WC   umeclidinium bromide  1 puff Inhalation Daily   vancomycin  500 mg Intravenous Q M,W,F-HD   Continuous Infusions:  ceFEPime (MAXIPIME) IV 1 g (03/02/19 2255)     LOS: 3 days    Bonnell Public, MD. [facility]  To contact the attending provider between 7A-7P or the covering provider during after hours 7P-7A, please log into the web site [email or web address] and access using universal [place] password for that web site. If you do not have the password, please call the hospital  operator.

## 2019-03-03 NOTE — Progress Notes (Signed)
1 unit of prbc completed at 1151 for a hgb of 6.6. No signs or symptoms of transfusion reacted noted. Vital signs stable. Repeat hgb 8.2. No signs of active bleeding. Will continue to monitor.   Hiram Comber, RN 03/03/2019 4:31 PM

## 2019-03-03 NOTE — Progress Notes (Signed)
CRITICAL VALUE ALERT  Critical Value:  Hemoglobin 6.6  Date & Time Notied:  03/03/2019 at 4.14  Provider Notified: Tylene Fantasia  Orders Received/Actions taken: transfuse 1 unit of RBC

## 2019-03-03 NOTE — Final Progress Note (Signed)
Blood consent received by Mosetta Pigeon (daughter) and Mare, Ludtke (Son).  Second verification was received by Marvia Pickles, RN.  Patient is unable to consent due to disorientation.

## 2019-03-03 NOTE — Progress Notes (Signed)
Occupational Therapy Evaluation Patient Details Name: Trevor [name] MRN: [id number] DOB: 27-Sep-1946 Today's Date: 03/03/2019    History of Present Illness 73 y.o. male admitted on 02/28/19 for SOB.  Pt dx with sepsis gangrene of L LE and suspected COVID-19 (test came back negative).  Pt s/p L AKA on 03/01/19.  Pt with other significant PMH of stroke, STEMI, HTN, ESRD on HD, CVA, CAD, CHF, anemai, AAA, bil endarterectomy femoral, axilla femoral bypass.     Clinical Impression   Pt from [facility] and plan is to return to [name]. Pt currently mod A for LB ADL and min A with bed mobility. Will follow acutely to facilitate safe DC back to [name].     Follow Up Recommendations  [name];Supervision/Assistance - 24 hour    Equipment Recommendations  None recommended by OT    Recommendations for Other Services       Precautions / Restrictions Precautions Precautions: Fall Precaution Comments: L AKA      Mobility Bed Mobility Overal bed mobility: Needs Assistance Bed Mobility: Supine to Sit;Sit to Supine     Supine to sit: Min assist Sit to supine: Supervision      Transfers                 General transfer comment: Pt declined out of bed stating he was tired     Balance Overall balance assessment: Needs assistance   Sitting balance-Leahy Scale: Fair                                     ADL either performed or assessed with clinical judgement   ADL Overall ADL's : Needs assistance/impaired Eating/Feeding: Modified independent   Grooming: Set up   Upper Body Bathing: Set up;Supervision/ safety;Sitting   Lower Body Bathing: Minimal assistance;Bed level(rolling side/side)   Upper Body Dressing : Set up;Supervision/safety;Sitting   Lower Body Dressing: Moderate assistance;Bed level(rolling/lateral leans)               Functional mobility during ADLs: Minimal assistance(to move to EOB)       Vision         Perception     Praxis      Pertinent  Vitals/Pain Pain Assessment: Faces Faces Pain Scale: Hurts little more Pain Location: left residual limb Pain Descriptors / Indicators: Discomfort;Grimacing Pain Intervention(s): Limited activity within patient's tolerance     Hand Dominance Right   Extremity/Trunk Assessment Upper Extremity Assessment Upper Extremity Assessment: Generalized weakness   Lower Extremity Assessment Lower Extremity Assessment: Defer to PT evaluation   Cervical / Trunk Assessment Cervical / Trunk Assessment: Normal   Communication Communication Communication: No difficulties   Cognition Arousal/Alertness: Awake/alert Behavior During Therapy: WFL for tasks assessed/performed Overall Cognitive Status: No family/caregiver present to determine baseline cognitive functioning                                 General Comments: Pt not oriented to time or place; perseverating; pt woke from nap; once talking on the telephone at end of session, more oriented   General Comments       Exercises     Shoulder Instructions      Home Living Family/patient expects to be discharged to:: Skilled nursing facility  Prior Functioning/Environment Level of Independence: Independent with assistive device(s)        Comments: Pt reports he could transfer himself into and out of his WC, he got assistance from staff to bathe.         OT Problem List: Decreased strength;Decreased activity tolerance;Impaired balance (sitting and/or standing);Decreased safety awareness;Decreased knowledge of precautions;Pain      OT Treatment/Interventions: Self-care/ADL training;Therapeutic exercise;DME and/or AE instruction;Therapeutic activities;Patient/family education;Cognitive remediation/compensation;Balance training    OT Goals(Current goals can be found in the care plan section) Acute Rehab OT Goals Patient Stated Goal: go back to [facility] OT Goal  Formulation: With patient Time For Goal Achievement: 03/17/19 Potential to Achieve Goals: Good  OT Frequency: Min 2X/week   Barriers to D/C:            Co-evaluation              AM-PAC OT "6 Clicks" Daily Activity     Outcome Measure Help from another person eating meals?: None Help from another person taking care of personal grooming?: A Little Help from another person toileting, which includes using toliet, bedpan, or urinal?: A Lot Help from another person bathing (including washing, rinsing, drying)?: A Little Help from another person to put on and taking off regular upper body clothing?: A Little Help from another person to put on and taking off regular lower body clothing?: A Lot 6 Click Score: 17   End of Session Nurse Communication: Mobility status  Activity Tolerance: Patient tolerated treatment well Patient left: in bed;with call bell/phone within reach;with bed alarm set  OT Visit Diagnosis: Other abnormalities of gait and mobility (R26.89);Muscle weakness (generalized) (M62.81);Cognitive communication deficit (R41.841);Pain Pain - Right/Left: Left Pain - part of body: Leg                Time: 9381-8299 OT Time Calculation (min): 20 min Charges:  OT General Charges $OT Visit: 1 Visit OT Evaluation $OT Eval Moderate Complexity: [place], OT/L   Acute OT Clinical Specialist [place] Pager [phone] Office [phone]   [facility] 03/03/2019, 3:38 PM

## 2019-03-03 NOTE — Progress Notes (Signed)
Pharmacy Antibiotic Note  Trevor [name] is a 73 y.o. male admitted on 02/28/2019 with shortness of breath.  Pharmacy was  consulted on 3/30 for Vancomycin/Cefepime dosing. Patient has ESRD on HD MWF Received vanc loading dose 3/30 and 500 mg given 4/1 post HD.  s/p AKA 03/01/19. WBC has decreased 12.2>8.4 k. afebrile, Tm 98, LA4.8>1.7.   MD noted CXR shows improvement in pulmonary edema and patient  improved after HD, hypoxia resolved.  Blood cx ngtd x2 days   Plan: Continue Vancomycin 500 mg qHD MWF Continue Cefepime 1g IV q24h Trend WBC, temp, HD schedule F/U infectious work-up Drug levels as indicated, can check vancomycin level next week if vanc continued.   Height: 5\' 10"  (177.8 cm) Weight: 115 lb 4.8 oz (52.3 kg) IBW/kg (Calculated) : 73  Temp (24hrs), Avg:97.6 F (36.4 C), Min:97.1 F (36.2 C), Max:98 F (36.7 C)  Recent Labs  Lab 02/28/19 0140 02/28/19 0406 03/01/19 1004 03/02/19 0421 03/02/19 0627 03/03/19 0248  WBC 14.0*  --  9.4  --  12.2* 8.4  CREATININE 9.15*  --  4.36* 5.49*  --   --   LATICACIDVEN 4.8* 1.7  --   --   --   --     Estimated Creatinine Clearance: 8.9 mL/min (A) (by C-G formula based on SCr of 5.49 mg/dL (H)).    No Known Allergies  Vanc 3/30>> Cefep 3/30>>  3/30 COVID 19 >> neg 3/31 MRSA PCR >> neg 3/30 BCx x 2 >> ngtd   Thank you for allowing pharmacy to be part of this patients care team. Nicole Cella, [place] Pharmacist Pager: [phone] Please check AMION for all [place] phone numbers After 10:00 PM, call [place] 03/03/2019 12:00 PM

## 2019-03-03 NOTE — Progress Notes (Signed)
PT Cancellation Note  Patient Details Name: FEDERICO MAIORINO MRN: [id number] DOB: 1946-09-02   Cancelled Treatment:    Reason Eval/Treat Not Completed: (P) Medical issues which prohibited therapy(Pt with low HGb and recieving blood.  Will f/u per POC.  )   Ronnae Kaser Eli Hose 03/03/2019, 10:01 AM  Governor Rooks, PTA Acute Rehabilitation Services Pager [phone] Office [phone]

## 2019-03-03 NOTE — Progress Notes (Signed)
Inpatient Rehab Admissions Coordinator:   Received CIR Consult.  Noted pt long term resident at [name] with plans to return.  Not a candidate for inpatient rehab.  We will sign off.   Shann Medal, PT, DPT Admissions Coordinator [phone] 03/03/19  11:18 AM

## 2019-03-03 NOTE — Progress Notes (Signed)
[facility]  Subjective  - Minimal complaints of pain.   Objective (!) 133/58 70 98.1 F (36.7 C) 17 100%  Intake/Output Summary (Last 24 hours) at 03/03/2019 1428 Last data filed at 03/03/2019 1151 Gross per 24 hour  Intake 481 ml  Output 200 ml  Net 281 ml    Left AKA incision healing well with no active drainage.  Old post op bloody drainage on dressing. Clean dry dressing placed over AKA  Assessment/Planning: POD # 2 Left AKA  Left AKA appears viable Daily dressing changes per RN ordered. Patient was COVID 19 negative  Roxy Horseman 03/03/2019 2:28 PM --  Laboratory Lab Results: Recent Labs    03/03/19 0248 03/03/19 1305  WBC 8.4 8.7  HGB 6.6* 8.2*  HCT 20.6* 26.0*  PLT 192 183   BMET Recent Labs    03/01/19 1004 03/02/19 0421  NA 138 136  K 3.7 4.3  CL 97* 99  CO2 26 25  GLUCOSE 78 121*  BUN 20 33*  CREATININE 4.36* 5.49*  CALCIUM 8.3* 8.2*    COAG Lab Results  Component Value Date   INR 1.04 11/11/2018   INR 1.07 06/29/2017   INR 1.06 01/24/2017   No results found for: PTT

## 2019-03-03 NOTE — Progress Notes (Addendum)
[facility] Progress Note   Dialysis Orders: MWF [place] 4h 56.5kg 400/A1.5 2/2 bath LUE AVF Hep none - hect 1 ug - mircera 150 ug every 2 wks, last 3/23 - pth 68, ferr 1472, tsat 9% (prior 30%)  Assessment/Plan: 1. SOB/ hypoxemia - CXR cleared after HD 3/30. Suspect vol / pulm edema now resolved.  2. 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 ESRD on HD MWF. Net UF 1.5 L Wed - post wt - not congruent with bed wts today which are much lower - will need an accurate new EDW for d/c 4. Anemia CKD - Hb down 7.5 > 6.6 ABLA 35% sat this am pre transfusion - received 1 unit PRBC 4/2 - repeat labs in am transfuse again on HD prn- continue ESA per routine redose  Monday 5. CAD - presumed, medical Rx, unable to cath last Dec due to calcified vessels, had diffuse ST depression on EKG's at the time.  6. H/o CVA 7. HTN -on MTP 12.5 bid + volume control - keep SBP > 105 8. COPD gold stage III 9. DM2 - not on insulin at home 10. Disp - return to [name] [facility], [place] 03/03/2019,10:25 AM  LOS: 3 days   Pt seen, examined and agree w A/P as above.  [facility] 03/03/2019, 1:52 PM    Subjective:   Recognizes me but not sure from where.  Confused to location.  Offers that's its a prison but then says it's a joke yet can't say where he is.  Objective Vitals:   03/03/19 0503 03/03/19 0759 03/03/19 0831 03/03/19 0925  BP: (!) 123/56 (!) 135/56 (!) 149/61 (!) 147/58  Pulse: 60 64 66 65  Resp: 18 16  18   Temp: 97.9 F (36.6 C) 97.7 F (36.5 C) (!) 97.5 F (36.4 C)   TempSrc: Oral Oral Oral   SpO2: 100% 97%  100%  Weight: 52.3 kg     Height:       Physical Exam General: NAD thin elderly male mildly confused -  Heart: RRR Lungs: no rales Abdomen: soft NT Extremities: no RLE edema, left AKA wrapped  Dialysis Access:  left upper AVF + bruit   Additional  Objective Labs: Basic Metabolic Panel: Recent Labs  Lab 02/28/19 0140 03/01/19 1004 03/02/19 0421  NA 139 138 136  K 5.1 3.7 4.3  CL 94* 97* 99  CO2 25 26 25   GLUCOSE 123* 78 121*  BUN 47* 20 33*  CREATININE 9.15* 4.36* 5.49*  CALCIUM 9.6 8.3* 8.2*   Liver Function Tests: Recent Labs  Lab 02/28/19 0140 03/01/19 1004  AST 27 19  ALT 13 11  ALKPHOS 87 61  BILITOT 1.0 1.1  PROT 7.7 6.4*  ALBUMIN 2.6* 2.2*   No results for input(s): LIPASE, AMYLASE in the last 168 hours. CBC: Recent Labs  Lab 02/28/19 0140 03/01/19 1004 03/02/19 0627 03/03/19 0248  WBC 14.0* 9.4 12.2* 8.4  NEUTROABS 11.2* 7.1 9.8*  --   HGB 10.9* 8.0* 7.5* 6.6*  HCT 36.8* 24.8* 23.6* 20.6*  MCV 91.1 87.0 86.4 85.5  PLT 236 183 192 192   Blood Culture    Component Value Date/Time   SDES BLOOD RIGHT HAND 02/28/2019 1216   SPECREQUEST  02/28/2019 1216    BOTTLES DRAWN AEROBIC ONLY Blood Culture results may not be optimal due to an inadequate volume of blood received in culture bottles  CULT  02/28/2019 1216    NO GROWTH 2 DAYS Performed at [facility] [address] [place], [place] [address]    REPTSTATUS PENDING 02/28/2019 1216    Cardiac Enzymes: Recent Labs  Lab 02/28/19 0140 03/01/19 0655 03/02/19 0421  CKTOTAL  --  73 148  TROPONINI 0.20*  --   --    CBG: Recent Labs  Lab 03/02/19 2122  GLUCAP 85   Iron Studies:  Recent Labs    03/03/19 0248  IRON 54  TIBC 155*   Lab Results  Component Value Date   INR 1.04 11/11/2018   INR 1.07 06/29/2017   INR 1.06 01/24/2017   Studies/Results: No results found. Medications: . ceFEPime (MAXIPIME) IV 1 g (03/02/19 2255)   . sodium chloride   Intravenous Once  . aspirin EC  81 mg Oral Daily  . Chlorhexidine Gluconate Cloth  6 each Topical Q0600  . cinacalcet  90 mg Oral Q supper  . clopidogrel  75 mg Oral Daily  . docusate sodium  100 mg Oral Daily  . doxercalciferol  2 mcg Intravenous Q M,W,F-HD  . feeding  supplement (NEPRO CARB STEADY)  237 mL Oral BID BM  . gabapentin  300 mg Oral QHS  . metoprolol tartrate  12.5 mg Oral BID  . pantoprazole  40 mg Oral Daily  . sevelamer carbonate  1,600 mg Oral TID WC  . umeclidinium bromide  1 puff Inhalation Daily  . vancomycin  500 mg Intravenous Q M,W,F-HD

## 2019-03-04 DIAGNOSIS — N189 Chronic kidney disease, unspecified: Secondary | ICD-10-CM

## 2019-03-04 DIAGNOSIS — D631 Anemia in chronic kidney disease: Secondary | ICD-10-CM

## 2019-03-04 LAB — BPAM RBC
Blood Product Expiration Date: 202004082359
ISSUE DATE / TIME: 202004020805
Unit Type and Rh: 1700

## 2019-03-04 LAB — RENAL FUNCTION PANEL
Albumin: 2.1 g/dL — ABNORMAL LOW (ref 3.5–5.0)
Anion gap: 12 (ref 5–15)
BUN: 34 mg/dL — ABNORMAL HIGH (ref 8–23)
CO2: 27 mmol/L (ref 22–32)
Calcium: 8 mg/dL — ABNORMAL LOW (ref 8.9–10.3)
Chloride: 97 mmol/L — ABNORMAL LOW (ref 98–111)
Creatinine, Ser: 5.85 mg/dL — ABNORMAL HIGH (ref 0.61–1.24)
GFR calc Af Amer: 10 mL/min — ABNORMAL LOW (ref >60)
GFR calc non Af Amer: 9 mL/min — ABNORMAL LOW (ref >60)
Glucose, Bld: 76 mg/dL (ref 70–99)
Phosphorus: 2.3 mg/dL — ABNORMAL LOW (ref 2.5–4.6)
Potassium: 3.8 mmol/L (ref 3.5–5.1)
Sodium: 136 mmol/L (ref 135–145)

## 2019-03-04 LAB — CBC
HCT: 23.8 % — ABNORMAL LOW (ref 39.0–52.0)
Hemoglobin: 7.4 g/dL — ABNORMAL LOW (ref 13.0–17.0)
MCH: 26.7 pg (ref 26.0–34.0)
MCHC: 31.1 g/dL (ref 30.0–36.0)
MCV: 85.9 fL (ref 80.0–100.0)
Platelets: 174 10*3/uL (ref 150–400)
RBC: 2.77 MIL/uL — ABNORMAL LOW (ref 4.22–5.81)
RDW: 15.7 % — ABNORMAL HIGH (ref 11.5–15.5)
WBC: 7.4 10*3/uL (ref 4.0–10.5)
nRBC: 0 % (ref 0.0–0.2)

## 2019-03-04 LAB — TYPE AND SCREEN
ABO/RH(D): B POS
Antibody Screen: NEGATIVE
Unit division: 0

## 2019-03-04 LAB — MAGNESIUM: Magnesium: 2.3 mg/dL (ref 1.7–2.4)

## 2019-03-04 MED ORDER — IPRATROPIUM-ALBUTEROL 0.5-2.5 (3) MG/3ML IN SOLN: 3.0000 mL | Freq: Three times a day (TID) | RESPIRATORY_TRACT | Status: DC | PRN

## 2019-03-04 MED ORDER — ALTEPLASE 2 MG IJ SOLR: 2.0000 mg | Freq: Once | INTRAMUSCULAR | Status: DC | PRN

## 2019-03-04 MED ORDER — HEPARIN SODIUM (PORCINE) 1000 UNIT/ML DIALYSIS: 1000.0000 [IU] | INTRAMUSCULAR | Status: DC | PRN

## 2019-03-04 MED ORDER — SODIUM CHLORIDE 0.9 % IV SOLN: 100.0000 mL | INTRAVENOUS | Status: DC | PRN

## 2019-03-04 MED ORDER — LIDOCAINE-PRILOCAINE 2.5-2.5 % EX CREA: 1.0000 "application " | TOPICAL_CREAM | CUTANEOUS | Status: DC | PRN

## 2019-03-04 MED ORDER — LIDOCAINE HCL (PF) 1 % IJ SOLN: 5.0000 mL | INTRAMUSCULAR | Status: DC | PRN

## 2019-03-04 MED ORDER — DOXERCALCIFEROL 4 MCG/2ML IV SOLN
INTRAVENOUS | Status: AC
  Administered 2019-03-04: 2 ug via INTRAVENOUS
  Filled 2019-03-04: qty 2

## 2019-03-04 MED ORDER — VANCOMYCIN HCL IN DEXTROSE 500-5 MG/100ML-% IV SOLN
INTRAVENOUS | Status: AC
  Administered 2019-03-04: 500 mg via INTRAVENOUS
  Filled 2019-03-04: qty 100

## 2019-03-04 MED ORDER — PENTAFLUOROPROP-TETRAFLUOROETH EX AERO: 1.0000 "application " | INHALATION_SPRAY | CUTANEOUS | Status: DC | PRN

## 2019-03-04 NOTE — Procedures (Signed)
Pt seen on HD.  2 L off approx, no new c/o's.  Fevers all resolved. Will be about 4-6 kg under dry wt after HD today which is good given AKA and CHF on admission. Will follow.   I was present at this dialysis session, have reviewed the session itself and made  appropriate changes Kelly Splinter MD [place] pager [phone]   03/04/2019, 12:18 PM

## 2019-03-04 NOTE — Care Management Important Message (Signed)
Important Message  Patient Details  Name: Trevor [name] MRN: [id number] Date of Birth: 1946/05/23   Medicare Important Message Given:  Yes    Orbie Pyo 03/04/2019, 3:46 PM

## 2019-03-04 NOTE — Progress Notes (Signed)
PROGRESS NOTE    Trevor [name]  IOE:[id number] DOB: [birth year] DOA: 02/28/2019 PCP: [name]  Brief Narrative:73 year old African-American, current resident of [name], with history of coronary artery disease, ESRD on MWF HD, COPD, possible diabetes, CHF, TIA ,hypertension, hyperlipidemia and peripheral vascular disease.  Patient was admitted with fever and dyspnea.  Initially, patient was very hypoxic, and CXR showed Diffuse bilateral reticular interstitial opacities suggestive of pulmonary edema and/or atypical infection.  Patient also had chronic worsening necrosis of his left toes.  Gangrene was suspected.  Patient was started on broad-spectrum antibiotics for possible sepsis.  Nephrology and vascular surgery teams were consulted.  Covid-19 also suspected because of chest x-ray abnormalities and hypoxia so testing was sent.  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  Nephrology team has been directing renal replacement therapy.   03/03/2019: Patient seen.  No new complaints.  Hemoglobin done earlier today was 6.6 g/dL.  Patient is currently being transfused with packed red blood cells.  Subcutaneous heparin is on hold.  Stool also sent for fecal occult blood.  Reviewing prior records, patient was fecal occult blood positive around 2019.  As per nephrology note, patient is on Mircera every 2 weeks, and the last dose was on 02/21/2019.  Patient is followed up by the nephrology team.  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  Further management will depend on hospital course.  Assessment & Plan:   Principal Problem:   Acute respiratory failure with hypoxia (HCC) Active Problems:   Tobacco abuse   ESRD (end stage renal disease) (HCC)   Abnormal CXR   Marijuana abuse   Congestive heart failure (CHF) (HCC)   Cellulitis   Sepsis ([place])  Acute hypoxic respiratory failure:  Most likely secondary to pulmonary edema in an HD  patient.   Improved after HD and hypoxia resolved.  Currently saturating 100% on room air. Chest x-ray also shows improvement in pulmonary edema. Low suspicion for COVID-19, tested and negative.    ESRD on dialysis:  Nephrology following.  He has AV graft on the left arm. Underwent HD on 3/30 and 4/1. 03/03/2019: Likely hemodialysis tomorrow.  Patient may need further blood transfusion with hemodialysis tomorrow, depending on posttransfusion his hemoglobin.  Also follow the result of stool occult blood (query iron status).  Suspected sepsis:  Presented with leukocytosis.  Lactic acidosis of 4.8.  Elevated CRP.  Sepsis suspected on presentation.  Most likely secondary to left foot gangrene.  Started on broad-spectrum antibiotics.  Lactic acid has normalized.  Patient on empiric vancomycin and cefepime. DEFER TO VASCULAR WHEN TO STOP ANTIBIOTICS?  Left foot gangrene:  Has history of peripheral vascular disease.  Follows with vascular surgery as an outpatient.  Has history of axillary to bifemoral bypass grafting. vascular surgery following here. 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  STAPLES TO STAY IN ONE MONTH.  History of coronary artery disease:  Denies any chest pain at present.  Continue his home medications.  On aspirin, Plavix and statin at home.  Poor candidate for revascularization. 03/03/2019: No chest pain or shortness of breath reported at this time.  COPD Gold stage III/active smoker:  Counseled for smoking cessation.  Currently COPD status stable.  Continue home inhaler  History of stroke/TIA:  Continue aspirin, Plavix and statin after surgery.  Anemia in CKD/ABLA due to surgery status post blood transfusion 03/03/2019.  Essential hypertension Not on meds PTA.  DVT prophylaxis: Heparin [place] discontinued due to  hemoglobin of 6.6 g/dL. Code Status: Full Family Communication: None present at the bedside Disposition Plan: Likely DC back to skilled nursing facility when optimized.   Vascular surgery team to also cleared patient prior to discharge.    Consultants:  Vascular surgery,  Nephrology  Procedures:  Dialysis Left AKA 3/31   Antimicrobials:             Anti-infectives (From admission, onward)   Start     Dose/Rate Route Frequency Ordered Stop   03/01/19 1330  ceFAZolin (ANCEF) IVPB 2g/100 mL premix  Status:  Discontinued     2 g 200 mL/hr over 30 Minutes Intravenous Every 8 hours 03/01/19 1324 03/01/19 1357   02/28/19 2200  ceFEPIme (MAXIPIME) 1 g in sodium chloride 0.9 % 100 mL IVPB     1 g 200 mL/hr over 30 Minutes Intravenous Every 24 hours 02/28/19 0540     02/28/19 1200  vancomycin (VANCOCIN) IVPB 500 mg/100 ml premix     500 mg 100 mL/hr over 60 Minutes Intravenous Every M-W-F (Hemodialysis) 02/28/19 0540     02/28/19 0215  vancomycin (VANCOCIN) 1,250 mg in sodium chloride 0.9 % 250 mL IVPB     1,250 mg 166.7 mL/hr over 90 Minutes Intravenous  Once 02/28/19 0202 02/28/19 0500   02/28/19 0200  ceFEPIme (MAXIPIME) 2 g in sodium chloride 0.9 % 100 mL IVPB     2 g 200 mL/hr over 30        02/28/19 0200  vancomycin (VANCOCIN) IVPB 1000 mg/200 mL premix  Status:  Discontinued     1,000 mg 200 mL/hr over 60 Minutes Intravenous  Once 02/28/19 0157 02/28/19 0202        Nutrition Problem: Increased nutrient needs Etiology: post-op healing, chronic illness     Signs/Symptoms: estimated needs    Interventions: Nepro shake  Estimated body mass index is 16.51 kg/m as calculated from the following:   Height as of this encounter: 5\' 10"  (1.778 m).   Weight as of this encounter: 52.2 kg.    Subjective:  RESTING AFTER HD Objective: Vitals:   03/04/19 0900 03/04/19 0930 03/04/19 1000 03/04/19 1030  BP: (!) 157/73 (!) 143/64 125/69 134/62  Pulse: 75 73 74 73  Resp:      Temp:      TempSrc:      SpO2:      Weight:      Height:        Intake/Output Summary (Last 24 hours) at 03/04/2019 1058 Last data filed at  03/03/2019 1705 Gross per 24 hour  Intake 975.5 ml  Output 350 ml  Net 625.5 ml   Filed Weights   03/04/19 0637 03/04/19 0641 03/04/19 0730  Weight: 86.3 kg 85.7 kg 52.2 kg    Examination:  General exam: Appears calm and comfortable  Respiratory system: Clear to auscultation. Respiratory effort normal. Cardiovascular system: S1 & S2 heard, RRR. No JVD, murmurs, rubs, gallops or clicks. No pedal edema. Gastrointestinal system: Abdomen is nondistended, soft and nontender. No organomegaly or masses felt. Normal bowel sounds heard. Central nervous system: Alert and oriented. No focal neurological deficits. Extremities: left aka. Skin: No rashes, lesions or ulcers Psychiatry: Judgement and insight appear normal. Mood & affect appropriate.     Data Reviewed: I have personally reviewed following labs and imaging studies  CBC: Recent Labs  Lab 02/28/19 0140 03/01/19 1004 03/02/19 0627 03/03/19 0248 03/03/19 1305 03/04/19 0747  WBC 14.0* 9.4 12.2* 8.4 8.7 7.4  NEUTROABS 11.2*  7.1 9.8*  --  5.7  --   HGB 10.9* 8.0* 7.5* 6.6* 8.2* 7.4*  HCT 36.8* 24.8* 23.6* 20.6* 26.0* 23.8*  MCV 91.1 87.0 86.4 85.5 85.0 85.9  PLT 236 183 192 192 183 017   Basic Metabolic Panel: Recent Labs  Lab 02/28/19 0140 03/01/19 0655 03/01/19 1004 03/02/19 0421 03/03/19 0248 03/04/19 0414 03/04/19 0746  NA 139  --  138 136  --   --  136  K 5.1  --  3.7 4.3  --   --  3.8  CL 94*  --  97* 99  --   --  97*  CO2 25  --  26 25  --   --  27  GLUCOSE 123*  --  78 121*  --   --  76  BUN 47*  --  20 33*  --   --  34*  CREATININE 9.15*  --  4.36* 5.49*  --   --  5.85*  CALCIUM 9.6  --  8.3* 8.2*  --   --  8.0*  MG  --  2.2  --  2.3 2.3 2.3  --   PHOS  --   --   --   --   --   --  2.3*   GFR: Estimated Creatinine Clearance: 8.3 mL/min (A) (by C-G formula based on SCr of 5.85 mg/dL (H)). Liver Function Tests: Recent Labs  Lab 02/28/19 0140 03/01/19 1004 03/04/19 0746  AST 27 19  --   ALT 13 11   --   ALKPHOS 87 61  --   BILITOT 1.0 1.1  --   PROT 7.7 6.4*  --   ALBUMIN 2.6* 2.2* 2.1*   No results for input(s): LIPASE, AMYLASE in the last 168 hours. No results for input(s): AMMONIA in the last 168 hours. Coagulation Profile: No results for input(s): INR, PROTIME in the last 168 hours. Cardiac Enzymes: Recent Labs  Lab 02/28/19 0140 03/01/19 0655 03/02/19 0421  CKTOTAL  --  73 148  TROPONINI 0.20*  --   --    BNP (last 3 results) No results for input(s): PROBNP in the last 8760 hours. HbA1C: No results for input(s): HGBA1C in the last 72 hours. CBG: Recent Labs  Lab 03/02/19 2122  GLUCAP 85   Lipid Profile: No results for input(s): CHOL, HDL, LDLCALC, TRIG, CHOLHDL, LDLDIRECT in the last 72 hours. Thyroid Function Tests: No results for input(s): TSH, T4TOTAL, FREET4, T3FREE, THYROIDAB in the last 72 hours. Anemia Panel: Recent Labs    03/03/19 0248  TIBC 155*  IRON 54   Sepsis Labs: Recent Labs  Lab 02/28/19 0140 02/28/19 0406  LATICACIDVEN 4.8* 1.7    Recent Results (from the past 240 hour(s))  Blood Culture (routine x 2)     Status: None (Preliminary result)   Collection Time: 02/28/19  1:50 AM  Result Value Ref Range Status   Specimen Description BLOOD RIGHT ARM  Final   Special Requests   Final    BOTTLES DRAWN AEROBIC AND ANAEROBIC Blood Culture adequate volume   Culture   Final    NO GROWTH 3 DAYS Performed at [facility], [address], [place], [place] [address]    Report Status PENDING  Incomplete  Blood Culture (routine x 2)     Status: None (Preliminary result)   Collection Time: 02/28/19 12:16 PM  Result Value Ref Range Status   Specimen Description BLOOD RIGHT HAND  Final   Special  Requests   Final    BOTTLES DRAWN AEROBIC ONLY Blood Culture results may not be optimal due to an inadequate volume of blood received in culture bottles   Culture   Final    NO GROWTH 3 DAYS Performed at [facility], [place] [address], [place], [place] [address]    Report Status PENDING  Incomplete  Novel Coronavirus, NAA (hospital order; send-out to ref lab)     Status: None   Collection Time: 02/28/19  1:40 PM  Result Value Ref Range Status   SARS-CoV-2, NAA NOT DETECTED NOT DETECTED Final    Comment: Negative (Not Detected) results do not exclude infection caused by SARS CoV 2 and should not be used as the sole basis for treatment or other patient management decisions. Optimum specimen types and timing for peak viral levels during infections caused  by SARS CoV 2 have not been determined. Collection of multiple specimens (types and time points) from the same patient may be necessary to detect the virus. Improper specimen collection and handling, sequence variability underlying assay primers and or probes, or the presence of organisms in  quantities less than the limit of detection of the assay may lead to false negative results. Positive and negative predictive values of testing are highly dependent on prevalence. False negative results are more likely when prevalence of disease is high. (NOTE) The expected result is Negative (Not Detected). The SARS CoV 2 test is intended for the presumptive qualitative  detection of nucleic acid from SARS CoV 2 in upper and lower  respir atory specimens. Testing methodology is real time RT PCR. Test results must be correlated with clinical presentation and  evaluated in the context of other laboratory and epidemiologic data.  Test performance can be affected because the epidemiology and  clinical spectrum of infection caused by SARS CoV 2 is not fully  known. For example, the optimum types of specimens to collect and  when during the course of infection these specimens are most likely  to contain detectable viral RNA may not be known. This test has not been Food and Drug Administration (FDA) cleared or  approved and has been authorized by FDA under an Emergency Use  Authorization  (EUA). The test is only authorized for the duration of  the declaration that circumstances exist justifying the authorization  of emergency use of in vitro diagnostic tests for detection and or  diagnosis of SARS CoV 2 under Section 564(b)(1) of the Act, 21 U.S.C.  section [phone] 3(b)(1), unless the authorization is terminated or   revoked sooner. [facility] is certified under the  Clinical Laboratory Improvement Amendments of 1988 (CLIA), 42 U.S.C.  section [phone], to perform high complexity tests. Performed at [facility] [id number], [address], [place], [place] [address] Laboratory Director: Loleta Books, MD    Coronavirus Source NASOPHARYNGEAL  Final    Comment: Performed at [facility], [place] [address], [place], [place] [address]  Surgical pcr screen     Status: None   Collection Time: 03/01/19  9:53 AM  Result Value Ref Range Status   MRSA, PCR NEGATIVE NEGATIVE Final   Staphylococcus aureus NEGATIVE NEGATIVE Final    Comment: (NOTE) The Xpert SA Assay (FDA approved for NASAL specimens in patients 44 years of age and older), is one component of a comprehensive surveillance program. It is not intended to diagnose infection nor to guide or monitor treatment. Performed  at [facility], [place] [address] [place],  [address]          Radiology Studies: No results found.      Scheduled Meds: . aspirin EC  81 mg Oral Daily  . Chlorhexidine Gluconate Cloth  6 each Topical Q0600  . cinacalcet  90 mg Oral Q supper  . clopidogrel  75 mg Oral Daily  . [START ON 03/07/2019] darbepoetin (ARANESP) injection - DIALYSIS  150 mcg Intravenous Q Mon-HD  . docusate sodium  100 mg Oral Daily  . doxercalciferol  2 mcg Intravenous Q M,W,F-HD  . feeding supplement (NEPRO CARB STEADY)  237 mL Oral BID BM  . gabapentin  300 mg Oral QHS  . metoprolol tartrate  12.5 mg Oral BID  . pantoprazole  40 mg Oral  Daily  . sevelamer carbonate  1,600 mg Oral TID WC  . umeclidinium bromide  1 puff Inhalation Daily  . vancomycin  500 mg Intravenous Q M,W,F-HD   Continuous Infusions: . sodium chloride    . sodium chloride    . ceFEPime (MAXIPIME) IV Stopped (03/04/19 0962)     LOS: 4 days     [name], MD [facility]  If 7PM-7AM, please contact night-coverage [email or web address] Password [facility] 03/04/2019, 10:58 AM

## 2019-03-04 NOTE — Progress Notes (Signed)
Paged MD concerning pt's hemoglobin. Will cont to monitor pt.

## 2019-03-04 NOTE — Progress Notes (Signed)
PT Cancellation Note  Patient Details Name: Trevor [name] MRN: [id number] DOB: 12-19-1945   Cancelled Treatment:    Reason Eval/Treat Not Completed: Patient at procedure or test/unavailable Pt currently at HD. Will follow up as schedule allows.   Leighton Ruff, PT, DPT  Acute Rehabilitation Services  Pager: [phone] Office: [phone]  Rudean Hitt 03/04/2019, 9:29 AM

## 2019-03-04 NOTE — Progress Notes (Signed)
   VASCULAR SURGERY ASSESSMENT & PLAN:   POD 3  Left AKA: His left AKA is healing nicely.  I have written for dressing changes.  His staples will stay in for 1 month.  I have arranged that follow-up.   SUBJECTIVE:   No complaints.  PHYSICAL EXAM:   Vitals:   03/04/19 1030 03/04/19 1100 03/04/19 1140 03/04/19 1302  BP: 134/62 (!) 109/58 131/62 (!) 154/62  Pulse: 73 72 69 69  Resp:   18 18  Temp:   97.8 F (36.6 C) 97.8 F (36.6 C)  TempSrc:   Oral   SpO2:   95% 99%  Weight:   50.8 kg   Height:       His left above-the-knee amputation site looks fine without erythema or drainage.   LABS:   Lab Results  Component Value Date   WBC 7.4 03/04/2019   HGB 7.4 (L) 03/04/2019   HCT 23.8 (L) 03/04/2019   MCV 85.9 03/04/2019   PLT 174 03/04/2019   Lab Results  Component Value Date   CREATININE 5.85 (H) 03/04/2019   Lab Results  Component Value Date   INR 1.04 11/11/2018   CBG (last 3)  Recent Labs    03/02/19 2122  GLUCAP 85    PROBLEM LIST:    Principal Problem:   Acute respiratory failure with hypoxia (HCC) Active Problems:   Tobacco abuse   ESRD (end stage renal disease) (HCC)   Abnormal CXR   Marijuana abuse   Congestive heart failure (CHF) (HCC)   Cellulitis   Sepsis ([place])   CURRENT MEDS:   . aspirin EC  81 mg Oral Daily  . Chlorhexidine Gluconate Cloth  6 each Topical Q0600  . cinacalcet  90 mg Oral Q supper  . clopidogrel  75 mg Oral Daily  . [START ON 03/07/2019] darbepoetin (ARANESP) injection - DIALYSIS  150 mcg Intravenous Q Mon-HD  . docusate sodium  100 mg Oral Daily  . doxercalciferol  2 mcg Intravenous Q M,W,F-HD  . feeding supplement (NEPRO CARB STEADY)  237 mL Oral BID BM  . gabapentin  300 mg Oral QHS  . metoprolol tartrate  12.5 mg Oral BID  . pantoprazole  40 mg Oral Daily  . sevelamer carbonate  1,600 mg Oral TID WC  . umeclidinium bromide  1 puff Inhalation Daily  . vancomycin  500 mg Intravenous Q M,W,F-HD    [name] Beeper: [phone] Office: [phone] 03/04/2019

## 2019-03-05 LAB — CBC
HCT: 27.2 % — ABNORMAL LOW (ref 39.0–52.0)
Hemoglobin: 8.4 g/dL — ABNORMAL LOW (ref 13.0–17.0)
MCH: 26.4 pg (ref 26.0–34.0)
MCHC: 30.9 g/dL (ref 30.0–36.0)
MCV: 85.5 fL (ref 80.0–100.0)
Platelets: 205 10*3/uL (ref 150–400)
RBC: 3.18 MIL/uL — ABNORMAL LOW (ref 4.22–5.81)
RDW: 15.6 % — ABNORMAL HIGH (ref 11.5–15.5)
WBC: 8 10*3/uL (ref 4.0–10.5)
nRBC: 0 % (ref 0.0–0.2)

## 2019-03-05 LAB — CULTURE, BLOOD (ROUTINE X 2)
Culture: NO GROWTH
Culture: NO GROWTH
Special Requests: ADEQUATE

## 2019-03-05 LAB — MAGNESIUM: Magnesium: 2 mg/dL (ref 1.7–2.4)

## 2019-03-05 NOTE — Progress Notes (Addendum)
[facility] Progress Note   Subjective:  Seen in room. Alert, sitting in recliner. No complaints Denies CP, SOB.   Objective Vitals:   03/05/19 0432 03/05/19 0443 03/05/19 0528 03/05/19 0810  BP: (!) 172/62   (!) 172/62  Pulse: 66   66  Resp: 16   16  Temp: 98.4 F (36.9 C)     TempSrc: Oral     SpO2: 98%   100%  Weight:  51.4 kg 51.4 kg   Height:        Physical Exam General: Frail elderly male NAD pleasantly confused Heart: RRR Lungs: CTAB  Abdomen: soft NT Extremities: L AKA no LE edema  Dialysis Access: LUE AVF +bruit    Weight change: -34.1 kg   Additional Objective Labs: Basic Metabolic Panel: Recent Labs  Lab 03/01/19 1004 03/02/19 0421 03/04/19 0746  NA 138 136 136  K 3.7 4.3 3.8  CL 97* 99 97*  CO2 26 25 27   GLUCOSE 78 121* 76  BUN 20 33* 34*  CREATININE 4.36* 5.49* 5.85*  CALCIUM 8.3* 8.2* 8.0*  PHOS  --   --  2.3*   CBC: Recent Labs  Lab 03/01/19 1004 03/02/19 0627 03/03/19 0248 03/03/19 1305 03/04/19 0747 03/05/19 0651  WBC 9.4 12.2* 8.4 8.7 7.4 8.0  NEUTROABS 7.1 9.8*  --  5.7  --   --   HGB 8.0* 7.5* 6.6* 8.2* 7.4* 8.4*  HCT 24.8* 23.6* 20.6* 26.0* 23.8* 27.2*  MCV 87.0 86.4 85.5 85.0 85.9 85.5  PLT 183 192 192 183 174 205   Blood Culture    Component Value Date/Time   SDES BLOOD RIGHT HAND 02/28/2019 1216   SPECREQUEST  02/28/2019 1216    BOTTLES DRAWN AEROBIC ONLY Blood Culture results may not be optimal due to an inadequate volume of blood received in culture bottles   CULT  02/28/2019 1216    NO GROWTH 4 DAYS Performed at [facility], [address] [place], [place] [address]    REPTSTATUS PENDING 02/28/2019 1216     Medications: . ceFEPime (MAXIPIME) IV 1 g (03/04/19 2136)   . aspirin EC  81 mg Oral Daily  . Chlorhexidine Gluconate Cloth  6 each Topical Q0600  . cinacalcet  90 mg Oral Q supper  . clopidogrel  75 mg Oral Daily  . [START ON 03/07/2019] darbepoetin (ARANESP) injection -  DIALYSIS  150 mcg Intravenous Q Mon-HD  . docusate sodium  100 mg Oral Daily  . doxercalciferol  2 mcg Intravenous Q M,W,F-HD  . feeding supplement (NEPRO CARB STEADY)  237 mL Oral BID BM  . gabapentin  300 mg Oral QHS  . metoprolol tartrate  12.5 mg Oral BID  . pantoprazole  40 mg Oral Daily  . sevelamer carbonate  1,600 mg Oral TID WC  . umeclidinium bromide  1 puff Inhalation Daily  . vancomycin  500 mg Intravenous Q M,W,F-HD    Dialysis Orders: MWF [facility] 4h 56.5kg 400/A1.5 2/2 bath LUE AVF Hep none - hect 1 ug - mircera 150 ug every 2 wks, last 3/23 - pth 68, ferr 1472, tsat 9% (prior 30%)  Assessment/Plan: 1. SOB/ hypoxemia - CXRcleared after HD 3/30. Suspect vol / pulm edema now resolved.  2. 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 On empiric antibiotics -per primary  3. ESRD on HD MWF. Next HD 4/6.  4. HTN/Volume - BP elevated -on  MTP 12.5 bid. Continue to titrate volume down as tolerated. Keep  SBP >100. Now well below outpatient EDW, will lower at discharge.  5. Anemia CKD - Hgb 8.4 s/p 1 unit prbcs on 4/2. Continue ESA Next due 4/6.   6. CAD - presumed, medical Rx, unable to cath last Dec due to calcified vessels, had diffuse ST depression on EKG's at the time.  7. H/o CVA 8. COPD gold stage III 9. DM2 - not on insulin at home 10. ID - COVID-19 testing negative  11. Disp - return to [name]   Lynnda Child PA-C [place] Pager [phone] 03/05/2019,9:59 AM  LOS: 5 days   Pt seen, examined and agree w A/P as above.  [facility] 03/05/2019, 3:44 PM

## 2019-03-05 NOTE — Progress Notes (Signed)
PROGRESS NOTE    Trevor [name]  JGG:[id number] DOB: [birth year] DOA: 02/28/2019 PCP: [facility]  Brief Narrative30 year old African-American, current resident of [name], with history of coronary artery disease, ESRD on MWF HD, COPD, possible diabetes, CHF, TIA ,hypertension, hyperlipidemiaandperipheral vascular disease. Patient was admitted with fever anddyspnea. Initially, patientwas very hypoxic, andCXR showed Diffuse bilateral reticular interstitial opacities suggestiveofpulmonary edema and/or atypical infection. Patient also had chronic worsening necrosis of his left toes. Gangrene wassuspected.Patient was started on broad-spectrum antibiotics for possible sepsis. Nephrologyand vascular surgeryteams wereconsulted. Covid-19 also suspected because of chest x-ray abnormalities and hypoxia so testing wassent. 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 Nephrologyteam has been directing renal replacement therapy.   03/03/2019: Patient seen. No new complaints. Hemoglobin done earlier today was 6.6 g/dL. Patient is currently being transfused with packed red blood cells. Subcutaneous heparin is on hold. Stool also sent for fecal occult blood. Reviewing prior records, patient was fecal occult blood positive around 2019. As per nephrology note, patient is on Mirceraevery 2 weeks, and the last dose was on 02/21/2019. Patient is followed up by the nephrology team. 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 Further management will depend on hospital course.  Assessment & Plan:   Principal Problem:   Acute respiratory failure with hypoxia (HCC) Active Problems:   Tobacco abuse   ESRD (end stage renal disease) (HCC)   Abnormal CXR   Marijuana abuse   Congestive heart failure (CHF) (HCC)   Cellulitis   Sepsis (HCC)   Anemia associated with chronic renal failure   Acute hypoxic respiratory failure: Most  likely secondary to pulmonary edema in an HD patient.  Improved after HD and hypoxia resolved. Currently saturating 100% on room air. Chest x-ray also shows improvement in pulmonary edema. Low suspicion for COVID-19, tested and negative.  ESRD on dialysis:  Nephrology following. He has AV graft on the left arm. Underwent HD on 3/30 and 4/1. 03/03/2019: Likely hemodialysis tomorrow. Patient may need further blood transfusion with hemodialysis tomorrow, depending on posttransfusion his hemoglobin. Also follow the result of stool occult blood (query iron status).  Suspected sepsis:  Presented with leukocytosis. Lactic acidosis of 4.8. Elevated CRP. Sepsis suspected on presentation. Most likely secondary to left foot gangrene. Started on broad-spectrum antibiotics. Lactic acid has normalized. Patient on empiric vancomycin and cefepime. DEFER TO VASCULAR WHEN TO STOP ANTIBIOTICS?  Left foot gangrene:  Has history of peripheral vascular disease. Follows with vascular surgery as an outpatient. Has history of axillary to bifemoral bypass grafting. vascular surgery following here. 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 STAPLES TO STAY IN ONE MONTH.  History of coronary artery disease:  Denies any chest pain at present. Continue his home medications. On aspirin, Plavix and statin at home. Poor candidate for revascularization. 03/03/2019: No chest pain or shortness of breath reported at this time. COPD Gold stage III/active smoker:  Counseled for smoking cessation. Currently COPD status stable. Continue home inhaler  History of stroke/TIA:  Continue aspirin, Plavix and statin after surgery.  Anemia in CKD/ABLA due to surgery status post blood transfusion 03/03/2019.  Hemoglobin 8.4 today.  Essential hypertension Not on meds PTA.  DVT prophylaxis: Heparin SCdiscontinued due to hemoglobin of 6.6 g/dL. Code Status:Full Family Communication:None present at the bedside  Disposition Plan: Plan is to discharge to skilled nursing facility when bed available. Consultants: Vascular surgery,  Nephrology  Procedures:  Dialysis Left AKA 3/31  Antimicrobials:  Anti-infectives(From admission, onward)   Start   Dose/Rate Route Frequency Ordered Stop   03/01/19 1330  ceFAZolin (ANCEF) IVPB 2g/100 mL premixStatus:Discontinued    2 g 200 mL/hr over 30 Minutes Intravenous Every 8 hours 03/01/19 1324 03/01/19 1357   02/28/19 2200  ceFEPIme (MAXIPIME) 1 g in sodium chloride 0.9 % 100 mL IVPB   1 g 200 mL/hr over 30 Minutes Intravenous Every 24 hours 02/28/19 0540    02/28/19 1200  vancomycin (VANCOCIN) IVPB 500 mg/100 ml premix   500 mg 100 mL/hr over 60 Minutes Intravenous Every M-W-F (Hemodialysis) 02/28/19 0540    02/28/19 0215  vancomycin (VANCOCIN) 1,250 mg in sodium chloride 0.9 % 250 mL IVPB   1,250 mg 166.7 mL/hr over 90               02/28/19 0200  ceFEPIme (MAXIPIME) 2 g in sodium chloride 0.9 % 100 mL IVPB   2 g 200 mL/hr over 30        02/28/19 0200  vancomycin (VANCOCIN) IVPB 1000 mg/200 mL premixStatus:Discontinued    1,000 mg 200 mL/hr over 60 Minutes Intravenous Once 02/28/19 0157 02/28/19 0202        Nutrition Problem: Increased nutrient needs Etiology: post-op healing, chronic illness     Signs/Symptoms: estimated needs    Interventions: Nepro shake  Estimated body mass index is 16.26 kg/m as calculated from the following:   Height as of this encounter: 5\' 10"  (1.778 m).   Weight as of this encounter: 51.4 kg.    Subjective: Resting in bed awake and alert denies any specific complaints physical therapy recommends [name]  Objective: Vitals:   03/05/19 0432 03/05/19 0443 03/05/19 0528 03/05/19 0810  BP: (!) 172/62   (!) 172/62  Pulse: 66   66  Resp: 16   16  Temp: 98.4 F (36.9 C)     TempSrc: Oral     SpO2: 98%   100%  Weight:   51.4 kg 51.4 kg   Height:        Intake/Output Summary (Last 24 hours) at 03/05/2019 0838 Last data filed at 03/04/2019 1700 Gross per 24 hour  Intake 410 ml  Output 1387 ml  Net -977 ml   Filed Weights   03/04/19 1140 03/05/19 0443 03/05/19 0528  Weight: 50.8 kg 51.4 kg 51.4 kg    Examination:  General exam: Appears calm and comfortable  Respiratory system: Clear to auscultation. Respiratory effort normal. Cardiovascular system: S1 & S2 heard, RRR. No JVD, murmurs, rubs, gallops or clicks. No pedal edema. Gastrointestinal system: Abdomen is nondistended, soft and nontender. No organomegaly or masses felt. Normal bowel sounds heard. Central nervous system: Alert and oriented. No focal neurological deficits. Extremities: LEFT AKA Skin: No rashes, lesions or ulcers Psychiatry: Judgement and insight appear normal. Mood & affect appropriate.     Data Reviewed: I have personally reviewed following labs and imaging studies  CBC: Recent Labs  Lab 02/28/19 0140 03/01/19 1004 03/02/19 0627 03/03/19 0248 03/03/19 1305 03/04/19 0747 03/05/19 0651  WBC 14.0* 9.4 12.2* 8.4 8.7 7.4 8.0  NEUTROABS 11.2* 7.1 9.8*  --  5.7  --   --   HGB 10.9* 8.0* 7.5* 6.6* 8.2* 7.4* 8.4*  HCT 36.8* 24.8* 23.6* 20.6* 26.0* 23.8* 27.2*  MCV 91.1 87.0 86.4 85.5 85.0 85.9 85.5  PLT 236 183 192 192 183 174 026   Basic Metabolic Panel: Recent Labs  Lab 02/28/19 0140 03/01/19 0655 03/01/19 1004 03/02/19 0421 03/03/19 0248 03/04/19 0414  03/04/19 0746 03/05/19 0305  NA 139  --  138 136  --   --  136  --   K 5.1  --  3.7 4.3  --   --  3.8  --   CL 94*  --  97* 99  --   --  97*  --   CO2 25  --  26 25  --   --  27  --   GLUCOSE 123*  --  78 121*  --   --  76  --   BUN 47*  --  20 33*  --   --  34*  --   CREATININE 9.15*  --  4.36* 5.49*  --   --  5.85*  --   CALCIUM 9.6  --  8.3* 8.2*  --   --  8.0*  --   MG  --  2.2  --  2.3 2.3 2.3  --  2.0  PHOS  --   --   --   --   --   --  2.3*  --     GFR: Estimated Creatinine Clearance: 8.2 mL/min (A) (by C-G formula based on SCr of 5.85 mg/dL (H)). Liver Function Tests: Recent Labs  Lab 02/28/19 0140 03/01/19 1004 03/04/19 0746  AST 27 19  --   ALT 13 11  --   ALKPHOS 87 61  --   BILITOT 1.0 1.1  --   PROT 7.7 6.4*  --   ALBUMIN 2.6* 2.2* 2.1*   No results for input(s): LIPASE, AMYLASE in the last 168 hours. No results for input(s): AMMONIA in the last 168 hours. Coagulation Profile: No results for input(s): INR, PROTIME in the last 168 hours. Cardiac Enzymes: Recent Labs  Lab 02/28/19 0140 03/01/19 0655 03/02/19 0421  CKTOTAL  --  73 148  TROPONINI 0.20*  --   --    BNP (last 3 results) No results for input(s): PROBNP in the last 8760 hours. HbA1C: No results for input(s): HGBA1C in the last 72 hours. CBG: Recent Labs  Lab 03/02/19 2122  GLUCAP 85   Lipid Profile: No results for input(s): CHOL, HDL, LDLCALC, TRIG, CHOLHDL, LDLDIRECT in the last 72 hours. Thyroid Function Tests: No results for input(s): TSH, T4TOTAL, FREET4, T3FREE, THYROIDAB in the last 72 hours. Anemia Panel: Recent Labs    03/03/19 0248  TIBC 155*  IRON 54   Sepsis Labs: Recent Labs  Lab 02/28/19 0140 02/28/19 0406  LATICACIDVEN 4.8* 1.7    Recent Results (from the past 240 hour(s))  Blood Culture (routine x 2)     Status: None (Preliminary result)   Collection Time: 02/28/19  1:50 AM  Result Value Ref Range Status   Specimen Description BLOOD RIGHT ARM  Final   Special Requests   Final    BOTTLES DRAWN AEROBIC AND ANAEROBIC Blood Culture adequate volume   Culture   Final    NO GROWTH 4 DAYS Performed at [facility], [address], [place], [place] [address]    Report Status PENDING  Incomplete  Blood Culture (routine x 2)     Status: None (Preliminary result)   Collection Time: 02/28/19 12:16 PM  Result Value Ref Range Status   Specimen Description BLOOD RIGHT HAND  Final   Special Requests   Final    BOTTLES  DRAWN AEROBIC ONLY Blood Culture results may not be optimal due to an inadequate volume of blood received in culture bottles  Culture   Final    NO GROWTH 4 DAYS Performed at [facility], [place] [address], [place], [place] [address]    Report Status PENDING  Incomplete  Novel Coronavirus, NAA (hospital order; send-out to ref lab)     Status: None   Collection Time: 02/28/19  1:40 PM  Result Value Ref Range Status   SARS-CoV-2, NAA NOT DETECTED NOT DETECTED Final    Comment: Negative (Not Detected) results do not exclude infection caused by SARS CoV 2 and should not be used as the sole basis for treatment or other patient management decisions. Optimum specimen types and timing for peak viral levels during infections caused  by SARS CoV 2 have not been determined. Collection of multiple specimens (types and time points) from the same patient may be necessary to detect the virus. Improper specimen collection and handling, sequence variability underlying assay primers and or probes, or the presence of organisms in  quantities less than the limit of detection of the assay may lead to false negative results. Positive and negative predictive values of testing are highly dependent on prevalence. False negative results are more likely when prevalence of disease is high. (NOTE) The expected result is Negative (Not Detected). The SARS CoV 2 test is intended for the presumptive qualitative  detection of nucleic acid from SARS CoV 2 in upper and lower  respir atory specimens. Testing methodology is real time RT PCR. Test results must be correlated with clinical presentation and  evaluated in the context of other laboratory and epidemiologic data.  Test performance can be affected because the epidemiology and  clinical spectrum of infection caused by SARS CoV 2 is not fully  known. For example, the optimum types of specimens to collect and  when during the course of infection these specimens are  most likely  to contain detectable viral RNA may not be known. This test has not been Food and Drug Administration (FDA) cleared or  approved and has been authorized by FDA under an Emergency Use  Authorization (EUA). The test is only authorized for the duration of  the declaration that circumstances exist justifying the authorization  of emergency use of in vitro diagnostic tests for detection and or  diagnosis of SARS CoV 2 under Section 564(b)(1) of the Act, 21 U.S.C.  section [phone] 3(b)(1), unless the authorization is terminated or   revoked sooner. [facility] is certified under the  Clinical Laboratory Improvement Amendments of 1988 (CLIA), 42 U.S.C.  section [phone], to perform high complexity tests. Performed at [facility] [id number] [address], [place], [place] [address] Laboratory Director: Loleta Books, MD    Coronavirus Source NASOPHARYNGEAL  Final    Comment: Performed at [facility], [place] [address], [place], [place] [address]  Surgical pcr screen     Status: None   Collection Time: 03/01/19  9:53 AM  Result Value Ref Range Status   MRSA, PCR NEGATIVE NEGATIVE Final   Staphylococcus aureus NEGATIVE NEGATIVE Final    Comment: (NOTE) The Xpert SA Assay (FDA approved for NASAL specimens in patients 72 years of age and older), is one component of a comprehensive surveillance program. It is not intended to diagnose infection nor to guide or monitor treatment. Performed at [facility], [place] [address] [place],  [address]          Radiology Studies: No results found.      Scheduled  Meds: . aspirin EC  81 mg Oral Daily  . Chlorhexidine Gluconate Cloth  6 each Topical Q0600  . cinacalcet  90 mg Oral Q supper  . clopidogrel  75 mg Oral Daily  . [START ON 03/07/2019] darbepoetin (ARANESP) injection - DIALYSIS  150 mcg Intravenous Q Mon-HD  . docusate sodium  100 mg Oral Daily  .  doxercalciferol  2 mcg Intravenous Q M,W,F-HD  . feeding supplement (NEPRO CARB STEADY)  237 mL Oral BID BM  . gabapentin  300 mg Oral QHS  . metoprolol tartrate  12.5 mg Oral BID  . pantoprazole  40 mg Oral Daily  . sevelamer carbonate  1,600 mg Oral TID WC  . umeclidinium bromide  1 puff Inhalation Daily  . vancomycin  500 mg Intravenous Q M,W,F-HD   Continuous Infusions: . ceFEPime (MAXIPIME) IV 1 g (03/04/19 2136)     LOS: 5 days       [name], MD [facility]  If 7PM-7AM, please contact night-coverage [email or web address] Password [facility] 03/05/2019, 8:38 AM

## 2019-03-06 DIAGNOSIS — M6281 Muscle weakness (generalized): Secondary | ICD-10-CM | POA: Diagnosis present

## 2019-03-06 DIAGNOSIS — R296 Repeated falls: Secondary | ICD-10-CM | POA: Diagnosis not present

## 2019-03-06 DIAGNOSIS — J449 Chronic obstructive pulmonary disease, unspecified: Secondary | ICD-10-CM | POA: Diagnosis present

## 2019-03-06 DIAGNOSIS — I5042 Chronic combined systolic (congestive) and diastolic (congestive) heart failure: Secondary | ICD-10-CM | POA: Diagnosis present

## 2019-03-06 DIAGNOSIS — I739 Peripheral vascular disease, unspecified: Secondary | ICD-10-CM | POA: Diagnosis not present

## 2019-03-06 DIAGNOSIS — E1122 Type 2 diabetes mellitus with diabetic chronic kidney disease: Secondary | ICD-10-CM | POA: Diagnosis not present

## 2019-03-06 DIAGNOSIS — I120 Hypertensive chronic kidney disease with stage 5 chronic kidney disease or end stage renal disease: Secondary | ICD-10-CM | POA: Diagnosis not present

## 2019-03-06 DIAGNOSIS — R0902 Hypoxemia: Secondary | ICD-10-CM | POA: Diagnosis not present

## 2019-03-06 DIAGNOSIS — I214 Non-ST elevation (NSTEMI) myocardial infarction: Secondary | ICD-10-CM | POA: Diagnosis present

## 2019-03-06 DIAGNOSIS — I119 Hypertensive heart disease without heart failure: Secondary | ICD-10-CM | POA: Diagnosis not present

## 2019-03-06 DIAGNOSIS — M2550 Pain in unspecified joint: Secondary | ICD-10-CM | POA: Diagnosis not present

## 2019-03-06 DIAGNOSIS — Z4781 Encounter for orthopedic aftercare following surgical amputation: Secondary | ICD-10-CM | POA: Diagnosis not present

## 2019-03-06 DIAGNOSIS — G4709 Other insomnia: Secondary | ICD-10-CM | POA: Diagnosis present

## 2019-03-06 DIAGNOSIS — I10 Essential (primary) hypertension: Secondary | ICD-10-CM | POA: Diagnosis not present

## 2019-03-06 DIAGNOSIS — Z992 Dependence on renal dialysis: Secondary | ICD-10-CM | POA: Diagnosis not present

## 2019-03-06 DIAGNOSIS — Z7401 Bed confinement status: Secondary | ICD-10-CM | POA: Diagnosis not present

## 2019-03-06 DIAGNOSIS — Z8673 Personal history of transient ischemic attack (TIA), and cerebral infarction without residual deficits: Secondary | ICD-10-CM | POA: Diagnosis not present

## 2019-03-06 DIAGNOSIS — H547 Unspecified visual loss: Secondary | ICD-10-CM | POA: Diagnosis not present

## 2019-03-06 DIAGNOSIS — E1151 Type 2 diabetes mellitus with diabetic peripheral angiopathy without gangrene: Secondary | ICD-10-CM | POA: Diagnosis present

## 2019-03-06 DIAGNOSIS — I639 Cerebral infarction, unspecified: Secondary | ICD-10-CM | POA: Diagnosis present

## 2019-03-06 DIAGNOSIS — J9601 Acute respiratory failure with hypoxia: Secondary | ICD-10-CM | POA: Diagnosis present

## 2019-03-06 DIAGNOSIS — Z89612 Acquired absence of left leg above knee: Secondary | ICD-10-CM | POA: Diagnosis not present

## 2019-03-06 DIAGNOSIS — R41841 Cognitive communication deficit: Secondary | ICD-10-CM | POA: Diagnosis present

## 2019-03-06 DIAGNOSIS — N186 End stage renal disease: Secondary | ICD-10-CM | POA: Diagnosis present

## 2019-03-06 DIAGNOSIS — E785 Hyperlipidemia, unspecified: Secondary | ICD-10-CM | POA: Diagnosis not present

## 2019-03-06 DIAGNOSIS — J9600 Acute respiratory failure, unspecified whether with hypoxia or hypercapnia: Secondary | ICD-10-CM | POA: Diagnosis not present

## 2019-03-06 DIAGNOSIS — D649 Anemia, unspecified: Secondary | ICD-10-CM | POA: Diagnosis not present

## 2019-03-06 DIAGNOSIS — I679 Cerebrovascular disease, unspecified: Secondary | ICD-10-CM | POA: Diagnosis not present

## 2019-03-06 DIAGNOSIS — I2510 Atherosclerotic heart disease of native coronary artery without angina pectoris: Secondary | ICD-10-CM | POA: Diagnosis present

## 2019-03-06 DIAGNOSIS — E1165 Type 2 diabetes mellitus with hyperglycemia: Secondary | ICD-10-CM | POA: Diagnosis present

## 2019-03-06 DIAGNOSIS — I252 Old myocardial infarction: Secondary | ICD-10-CM | POA: Diagnosis not present

## 2019-03-06 MED ORDER — OXYCODONE HCL 5 MG PO TABS: 5.0000 mg | ORAL_TABLET | Freq: Four times a day (QID) | ORAL | 0 refills | Status: DC | PRN

## 2019-03-06 NOTE — TOC Initial Note (Signed)
Transition of Care [facility]) - Initial/Assessment Note    Patient Details  Name: Trevor [name] MRN: [id number] Date of Birth: 02-10-1946  Transition [facility]) CM/SW Contact:    Gabrielle Dare Phone Number: 03/06/2019, 9:58 AM  Clinical Narrative:                   Expected Discharge Plan: [place] Barriers to Discharge: No Barriers Identified   Patient Goals and CMS Choice Patient states their goals for this hospitalization and ongoing recovery are:: Patient did not state goal.  Patient stated " I am confused" CMS Medicare.gov Compare Post Acute Care list provided to:: Other (Comment Required)(Patient will be returning to previous [name] [facility]) Choice offered to / list presented to : NA  Expected Discharge Plan and Services Expected Discharge Plan: [place] In-house Referral: Clinical Social Work Discharge Planning Services: NA Post Acute Care Choice: [place] Living arrangements for the past 2 months: [place] Expected Discharge Date: 03/06/19                 DME Agency: NA HH Arranged: NA HH Agency: NA  Prior Living Arrangements/Services Living arrangements for the past 2 months: [place] Lives with:: Facility Resident Patient language and need for interpreter reviewed:: No Do you feel safe going back to the place where you live?: Yes      Need for Family Participation in Patient Care: Yes (Comment) Care giver support system in place?: Yes (comment) Current home services: Other (comment)(NA) Criminal Activity/Legal Involvement Pertinent to Current Situation/Hospitalization: No - Comment as needed  Activities of Daily Living      Permission Sought/Granted Permission sought to share information with : Family Supports, Chartered certified accountant granted to share information with : Yes, Verbal Permission Granted  Share Information with NAME: Trevor [name]  Permission  granted to share info w AGENCY: [facility]  Permission granted to share info w Relationship: Son  Permission granted to share info w Contact Information: yes  Emotional Assessment Appearance:: Appears stated age Attitude/Demeanor/Rapport: Gracious, Unable to Assess Affect (typically observed): Calm, Appropriate Orientation: : Oriented to Self, Oriented to Place, Oriented to  Time Alcohol / Substance Use: Not Applicable Psych Involvement: No (comment)  Admission diagnosis:  Gangrene ([place]) [I96] Anemia associated with chronic renal failure [N18.9, D63.1] End-stage renal disease on hemodialysis ([place]) [N18.6, Z99.2] Elevated troponin I level [R79.89] Community acquired pneumonia, unspecified laterality [J18.9] Sepsis without acute organ dysfunction, due to unspecified organism [facility]) [A41.9] Patient Active Problem List   Diagnosis Date Noted  . Anemia associated with chronic renal failure   . Abnormal CXR 02/28/2019  . Marijuana abuse 02/28/2019  . Congestive heart failure (CHF) ([place]) 02/28/2019  . Cellulitis 02/28/2019  . Sepsis ([place]) 02/28/2019  . STEMI (ST elevation myocardial infarction) ([place]) 11/11/2018  . NSTEMI (non-ST elevated myocardial infarction) ([place]) 11/11/2018  . Pulmonary edema 09/20/2018  . Acute respiratory failure with hypoxia ([place]) 03/29/2018  . ESRD (end stage renal disease) ([place]) 03/29/2018  . Sleep stage dysfunction 11/10/2017  . 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 Vascular device, implant, or graft complication 75/64/3329  . Peripheral arterial disease ([place]) 10/20/2017  . Non-allergic rhinitis 10/20/2017  . 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 BPH (benign prostatic hyperplasia) 07/07/2017  . At risk for adverse drug reaction 07/02/2017  . Ischemia of extremity 06/28/2017  . Acute pulmonary edema ([place]) 06/27/2017  . Hypertensive urgency 01/24/2017  . 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 Ischemic chest  pain ([place]) 11/01/2016  . Essential hypertension  03/08/2016  . Dyslipidemia 01/12/2016  . Chest pain 08/12/2015  . Accelerated hypertension 08/12/2015  . Carotid stenosis   . Abdominal aortic aneurysm ([place]) 03/06/2015  . Protein-calorie malnutrition, severe ([place]) 05/20/2014  . End-stage renal disease on hemodialysis ([place]) 05/19/2014  . Anemia of renal disease 05/19/2014  . Coronary artery disease, non-occlusive:  09/28/2012  . Secondary hyperparathyroidism ([place]) 09/27/2012  . Non compliance with medical treatment 09/27/2012  . Acute on chronic combined systolic and diastolic CHF (congestive heart failure) ([place]) 10/10/2011  . A-fib ([place]) 10/08/2011  . History of CVA (cerebrovascular accident) 10/07/2011  . Cocaine abuse ([place]) 10/07/2011  . Tobacco abuse 08/07/2011  . Bruit 08/07/2011   PCP:  Clinic, [place]:  No Pharmacies Listed    Social Determinants of Health (SDOH) Interventions    Readmission Risk Interventions No flowsheet data found.

## 2019-03-06 NOTE — TOC Progression Note (Signed)
Transition of Care [facility]) - Progression Note    Patient Details  Name: Trevor [name] MRN: [id number] Date of Birth: 1946/05/14  Transition of Care [facility]) CM/SW [place], [place] Phone [phone] 03/06/2019, 10:00 AM  Clinical Narrative:    Patient will DC to: [facility] Anticipated DC date: 03/06/2019 Family notified: Rod[place], Candel[place] [phone] Transport by: Corey Harold Per MD patient ready for DC to [facility]. RN, patient, patient's family, and facility notified of DC.  Discharge summary sent to facility.  RN given number to report [phone]. Room # [place] packet on chart.  Ambulance transport requested for patient.   Expected Discharge Plan: [place] Barriers to Discharge: No Barriers Identified  Expected Discharge Plan and Services Expected Discharge Plan: [place] In-house Referral: Clinical Social Work Discharge Planning Services: NA Post Acute Care Choice: [place] Living arrangements for the past 2 months: [place] Expected Discharge Date: 03/06/19                 DME Agency: NA HH Arranged: NA HH Agency: NA   Social Determinants of Health (SDOH) Interventions    Readmission Risk Interventions No flowsheet data found.

## 2019-03-06 NOTE — Progress Notes (Signed)
[facility] Progress Note   Subjective:  Seen in room. Resting in bed. No new complaints. For discharge today   Objective Vitals:   03/05/19 1400 03/05/19 2121 03/06/19 0620 03/06/19 0700  BP: (!) 169/74 (!) 160/72 (!) 176/73 (!) 155/56  Pulse: 68 63 69 60  Resp: 18 18 18    Temp: 98.5 F (36.9 C) 98.2 F (36.8 C) 97.8 F (36.6 C)   TempSrc:  Oral    SpO2: 100% 98% 99%   Weight:      Height:        Physical Exam General: Frail elderly male NAD pleasantly confused Heart: RRR Lungs: CTAB  Abdomen: soft NT Extremities: L AKA no LE edema  Dialysis Access: LUE AVF +bruit    Weight change:    Additional Objective Labs: Basic Metabolic Panel: Recent Labs  Lab 03/01/19 1004 03/02/19 0421 03/04/19 0746  NA 138 136 136  K 3.7 4.3 3.8  CL 97* 99 97*  CO2 26 25 27   GLUCOSE 78 121* 76  BUN 20 33* 34*  CREATININE 4.36* 5.49* 5.85*  CALCIUM 8.3* 8.2* 8.0*  PHOS  --   --  2.3*   CBC: Recent Labs  Lab 03/01/19 1004 03/02/19 0627 03/03/19 0248 03/03/19 1305 03/04/19 0747 03/05/19 0651  WBC 9.4 12.2* 8.4 8.7 7.4 8.0  NEUTROABS 7.1 9.8*  --  5.7  --   --   HGB 8.0* 7.5* 6.6* 8.2* 7.4* 8.4*  HCT 24.8* 23.6* 20.6* 26.0* 23.8* 27.2*  MCV 87.0 86.4 85.5 85.0 85.9 85.5  PLT 183 192 192 183 174 205   Blood Culture    Component Value Date/Time   SDES BLOOD RIGHT HAND 02/28/2019 1216   SPECREQUEST  02/28/2019 1216    BOTTLES DRAWN AEROBIC ONLY Blood Culture results may not be optimal due to an inadequate volume of blood received in culture bottles   CULT  02/28/2019 1216    NO GROWTH 5 DAYS Performed at [facility], [place] [address], [place], [place] [address]    REPTSTATUS 03/05/2019 FINAL 02/28/2019 1216     Medications: . ceFEPime (MAXIPIME) IV 1 g (03/05/19 2237)   . aspirin EC  81 mg Oral Daily  . Chlorhexidine Gluconate Cloth  6 each Topical Q0600  . cinacalcet  90 mg Oral Q supper  . clopidogrel  75 mg Oral Daily  . [START ON  03/07/2019] darbepoetin (ARANESP) injection - DIALYSIS  150 mcg Intravenous Q Mon-HD  . docusate sodium  100 mg Oral Daily  . doxercalciferol  2 mcg Intravenous Q M,W,F-HD  . feeding supplement (NEPRO CARB STEADY)  237 mL Oral BID BM  . gabapentin  300 mg Oral QHS  . metoprolol tartrate  12.5 mg Oral BID  . pantoprazole  40 mg Oral Daily  . sevelamer carbonate  1,600 mg Oral TID WC  . umeclidinium bromide  1 puff Inhalation Daily  . vancomycin  500 mg Intravenous Q M,W,F-HD    Dialysis Orders: MWF [facility] 4h 56.5kg 400/A1.5 2/2 bath LUE AVF Hep none - hect 1 ug - mircera 150 ug every 2 wks, last 3/23 - pth 68, ferr 1472, tsat 9% (prior 30%)  Assessment/Plan: 1. SOB/ hypoxemia - CXRcleared after HD 3/30. Suspect vol / pulm edema now resolved.  2. 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 On empiric antibiotics -per primary  3. ESRD on HD MWF. Next HD 4/6.  4. HTN/Volume - BP elevated -on  MTP 12.5 bid. Continue to  titrate volume down as tolerated. Keep SBP >100. Now well below outpatient EDW, will lower at discharge.  5. Anemia CKD - Hgb 8.4 s/p 1 unit prbcs on 4/2. Continue ESA Next due 4/6.   6. CAD - presumed, medical Rx, unable to cath last Dec due to calcified vessels, had diffuse ST depression on EKG's at the time.  7. H/o CVA 8. COPD gold stage III 9. DM2 - not on insulin at home 10. ID - COVID-19 testing negative  11. Disp - return to [name]   Lynnda Child PA-C [facility] Pager [phone] 03/06/2019,9:04 AM  LOS: 6 days

## 2019-03-06 NOTE — Discharge Planning (Signed)
Nsg Discharge Note  Admit Date:  02/28/2019 Discharge date: 03/06/2019   [name] to be D/C'd [place] per MD order.  AVS completed.  Copy for patient and copy for receiving facility. Patient/caregiver able to verbalize understanding.  Discharge Medication: Allergies as of 03/06/2019   No Known Allergies     Medication List    STOP taking these medications   HYDROcodone-acetaminophen 5-325 MG tablet Commonly known as:  Norco     TAKE these medications   aspirin EC 81 MG tablet Take 1 tablet (81 mg total) by mouth daily.   atorvastatin 80 MG tablet Commonly known as:  LIPITOR Take 1 tablet (80 mg total) by mouth at bedtime.   bisacodyl 10 MG suppository Commonly known as:  DULCOLAX Place 10 mg rectally daily as needed (constipation).   cinacalcet 90 MG tablet Commonly known as:  SENSIPAR Take 90 mg by mouth daily with supper.   clopidogrel 75 MG tablet Commonly known as:  PLAVIX Take 1 tablet (75 mg total) by mouth daily.   D3-1000 25 MCG (1000 UT) tablet Generic drug:  Cholecalciferol Take 1,000 Units by mouth daily.   diclofenac sodium 1 % Gel Commonly known as:  VOLTAREN Apply 2 g topically 4 (four) times daily as needed (PAIN IN LEFT FOOT).   docusate sodium 100 MG capsule Commonly known as:  COLACE Take 200 mg by mouth at bedtime.   doxercalciferol 4 MCG/2ML injection Commonly known as:  HECTOROL Inject 1 mL (2 mcg total) into the vein every Monday, Wednesday, and Friday with hemodialysis.   ENSURE PO Take 237 mLs by mouth 2 (two) times daily.   feeding supplement (NEPRO CARB STEADY) Liqd Take 237 mLs by mouth 2 (two) times daily between meals.   gabapentin 300 MG capsule Commonly known as:  NEURONTIN Take 1 capsule (300 mg total) by mouth at bedtime.   ipratropium-albuterol 0.5-2.5 (3) MG/3ML Soln Commonly known as:  DUONEB Take 3 mLs by nebulization every 8 (eight) hours as needed (for copd).   isosorbide  mononitrate 30 MG 24 hr tablet Commonly known as:  IMDUR Take 1 tablet (30 mg total) by mouth daily.   Lonhala Magnair Refill Kit 25 MCG/ML Soln Generic drug:  Glycopyrrolate Inhale 25 mcg into the lungs 2 (two) times daily.   metoprolol tartrate 25 MG tablet Commonly known as:  LOPRESSOR Take 0.5 tablets (12.5 mg total) by mouth 2 (two) times daily.   mirtazapine 7.5 MG tablet Commonly known as:  REMERON Take 7.5 mg by mouth at bedtime.   nitroGLYCERIN 0.4 MG SL tablet Commonly known as:  NITROSTAT Place 1 tablet (0.4 mg total) under the tongue every 5 (five) minutes x 3 doses as needed for chest pain.   ondansetron 4 MG tablet Commonly known as:  ZOFRAN Take 4 mg by mouth every 6 (six) hours as needed for nausea or vomiting.   oxyCODONE 5 MG immediate release tablet Commonly known as:  Oxy IR/ROXICODONE Take 1 tablet (5 mg total) by mouth every 6 (six) hours as needed for severe pain.   senna 8.6 MG Tabs tablet Commonly known as:  SENOKOT Take 1 tablet by mouth daily.   sevelamer carbonate 800 MG tablet Commonly known as:  RENVELA Take 1,600 mg by mouth 3 (three) times daily with meals.   Vascepa 1 g Caps Generic drug:  Icosapent Ethyl Take 1 g by mouth 2 (two) times daily.       Discharge Assessment: Vitals:  03/06/19 0620 03/06/19 0700  BP: (!) 176/73 (!) 155/56  Pulse: 69 60  Resp: 18   Temp: 97.8 F (36.6 C)   SpO2: 99%    Skin clean, dry and intact without evidence of skin break down, no evidence of skin tears noted. IV catheter discontinued intact. Site without signs and symptoms of complications - no redness or edema noted at insertion site, patient denies c/o pain - only slight tenderness at site.  Dressing with slight pressure applied.  D/c Instructions-Education: Discharge instructions given to patient/family with verbalized understanding. D/c education completed with patient/family including follow up instructions, medication list, d/c activities  limitations if indicated, with other d/c instructions as indicated by MD - patient able to verbalize understanding, all questions fully answered. Patient instructed to return to ED, call 911, or call MD for any changes in condition.  Patient escorted via stretcher and [facility] ambulance services.   Hiram Comber, RN 03/06/2019 12:07 PM

## 2019-03-06 NOTE — Discharge Summary (Signed)
Physician Discharge Summary  Trevor [name] HCW:[id number] DOB: [birth year] DOA: 02/28/2019  PCP: [name]  Admit date: 02/28/2019 Discharge date: 03/06/2019  Admitted From: Nursing home Disposition: Nursing home  Recommendations for Outpatient Follow-up:  1. Follow up with PCP in 1-2 weeks 2. Please obtain BMP/CBC in one week 3. Please follow up FOR HEMODIALYSIS MON WED FRI 4. Follow-up with Dr. Doren Custard from vascular surgery for removal of staples in 3 TO 4  weeks  Home Health: None Equipment/Devices: None  Discharge Condition: Stable and improved  CODE STATUS: Full code Diet recommendation: Renal diet  Brief/Interim Summary:73 year old African-American, current resident of [name], with history of coronary artery disease, ESRD on MWF HD, COPD, possible diabetes, CHF, TIA ,hypertension, hyperlipidemiaandperipheral vascular disease. Patient was admitted with fever anddyspnea. Initially, patientwas very hypoxic, andCXR showed Diffuse bilateral reticular interstitial opacities suggestiveofpulmonary edema and/or atypical infection. Patient also had chronic worsening necrosis of his left toes. Gangrene wassuspected.Patient was started on broad-spectrum antibiotics for possible sepsis. Nephrologyand vascular surgeryteams wereconsulted. Covid-19 also suspected because of chest x-ray abnormalities and hypoxia so testing wassent. 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 Nephrologyteam has been directing renal replacement therapy.   Discharge Diagnoses:  Principal Problem:   Acute respiratory failure with hypoxia (HCC) Active Problems:   Tobacco abuse   ESRD (end stage renal disease) (HCC)   Abnormal CXR   Marijuana abuse   Congestive heart failure (CHF) (HCC)   Cellulitis   Sepsis ([place])   Anemia associated with chronic renal failure  Acute hypoxic respiratory failure:  secondary to pulmonary edema in an HD patient.  Improved after HD and hypoxia  resolved. Currently saturating 100% on room air. Chest x-ray also shows improvement in pulmonary edema.  COVID-19, tested and negative.  ESRD on dialysis:   He has AV graft on the left arm. Underwent HD on 3/30 and 4/1 and 03/04/2019 Next dialysis session 03/07/2019.  Suspected sepsis:  Presented with leukocytosis. Lactic acidosis of 4.8. Elevated CRP. Sepsis suspected on presentation. Most likely secondary to left foot gangrene. Started on broad-spectrum antibiotics. Lactic acid has normalized. He was treated with vancomycin and cefepime.    Left foot gangrene:  Has history of peripheral vascular disease. Follows with vascular surgery as an outpatient. 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  Follow-up with Dr. Doren Custard for removal of staples.  History of coronary artery disease:  Denies any chest pain at present. Continue his home medications. On aspirin, Plavix and statin at home. Poor candidate for revascularization.  COPD Gold stage III/active smoker:  Counseled for smoking cessation. Currently COPD status stable. Continue home inhaler  History of stroke/TIA:  Continue aspirin, Plavix and statin after surgery.  Anemia in CKD/ABLA due to surgerystatus post blood transfusion 03/03/2019.  Hemoglobin 8.4 today.  Essential hypertension Not on meds PTA    Nutrition Problem: Increased nutrient needs Etiology: post-op healing, chronic illness    Signs/Symptoms: estimated needs     Interventions: Nepro shake  Estimated body mass index is 16.26 kg/m as calculated from the following:   Height as of this encounter: _0  (1.778 m).   Weight as of this encounter: 51.4 kg.  Discharge Instructions  Discharge Instructions    Call MD for:  difficulty breathing, headache or visual disturbances   Complete by:  As directed    Call MD for:  persistant nausea and vomiting   Complete by:  As  directed  Call MD for:  severe uncontrolled pain   Complete by:  As directed    Call MD for:  temperature >100.4   Complete by:  As directed    Diet - low sodium heart healthy   Complete by:  As directed    Increase activity slowly   Complete by:  As directed      Allergies as of 03/06/2019   No Known Allergies     Medication List    STOP taking these medications   HYDROcodone-acetaminophen 5-325 MG tablet Commonly known as:  Norco     TAKE these medications   aspirin EC 81 MG tablet Take 1 tablet (81 mg total) by mouth daily.   atorvastatin 80 MG tablet Commonly known as:  LIPITOR Take 1 tablet (80 mg total) by mouth at bedtime.   bisacodyl 10 MG suppository Commonly known as:  DULCOLAX Place 10 mg rectally daily as needed (constipation).   cinacalcet 90 MG tablet Commonly known as:  SENSIPAR Take 90 mg by mouth daily with supper.   clopidogrel 75 MG tablet Commonly known as:  PLAVIX Take 1 tablet (75 mg total) by mouth daily.   D3-1000 25 MCG (1000 UT) tablet Generic drug:  Cholecalciferol Take 1,000 Units by mouth daily.   diclofenac sodium 1 % Gel Commonly known as:  VOLTAREN Apply 2 g topically 4 (four) times daily as needed (PAIN IN LEFT FOOT).   docusate sodium 100 MG capsule Commonly known as:  COLACE Take 200 mg by mouth at bedtime.   doxercalciferol 4 MCG/2ML injection Commonly known as:  HECTOROL Inject 1 mL (2 mcg total) into the vein every Monday, Wednesday, and Friday with hemodialysis.   ENSURE PO Take 237 mLs by mouth 2 (two) times daily.   feeding supplement (NEPRO CARB STEADY) Liqd Take 237 mLs by mouth 2 (two) times daily between meals.   gabapentin 300 MG capsule Commonly known as:  NEURONTIN Take 1 capsule (300 mg total) by mouth at bedtime.   ipratropium-albuterol 0.5-2.5 (3) MG/3ML Soln Commonly known as:  DUONEB Take 3 mLs by nebulization every 8 (eight) hours as needed (for copd).   isosorbide mononitrate 30 MG 24 hr  tablet Commonly known as:  IMDUR Take 1 tablet (30 mg total) by mouth daily.   Lonhala Magnair Refill Kit 25 MCG/ML Soln Generic drug:  Glycopyrrolate Inhale 25 mcg into the lungs 2 (two) times daily.   metoprolol tartrate 25 MG tablet Commonly known as:  LOPRESSOR Take 0.5 tablets (12.5 mg total) by mouth 2 (two) times daily.   mirtazapine 7.5 MG tablet Commonly known as:  REMERON Take 7.5 mg by mouth at bedtime.   nitroGLYCERIN 0.4 MG SL tablet Commonly known as:  NITROSTAT Place 1 tablet (0.4 mg total) under the tongue every 5 (five) minutes x 3 doses as needed for chest pain.   ondansetron 4 MG tablet Commonly known as:  ZOFRAN Take 4 mg by mouth every 6 (six) hours as needed for nausea or vomiting.   oxyCODONE 5 MG immediate release tablet Commonly known as:  Oxy IR/ROXICODONE Take 1 tablet (5 mg total) by mouth every 6 (six) hours as needed for severe pain.   senna 8.6 MG Tabs tablet Commonly known as:  SENOKOT Take 1 tablet by mouth daily.   sevelamer carbonate 800 MG tablet Commonly known as:  RENVELA Take 1,600 mg by mouth 3 (three) times daily with meals.   Vascepa 1 g Caps Generic drug:  Icosapent Ethyl Take 1  g by mouth 2 (two) times daily.      Follow-up Information    [facility].   Contact information: [place] [address] [phone]        Lelon Perla, MD .   Specialty:  Cardiology Contact information: [address] [place] [address] [phone]          No Known Allergies  Consultations:  Nephrology and vascular surgery   Procedures/Studies: Dg Chest Port 1 View  Result Date: 03/01/2019 CLINICAL DATA:  Pulmonary infiltrates EXAM: PORTABLE CHEST 1 VIEW COMPARISON:  02/28/2019 FINDINGS: Previously seen interstitial prominence throughout the lungs has improved. Mild residual interstitial prominence in the left mid and lower lung which could reflect  residual asymmetric edema or atypical infection. No effusions. Heart is normal size. Aortic atherosclerosis. No acute bony abnormality. IMPRESSION: Improving interstitial prominence throughout the lungs with mild residual interstitial prominence in the left mid and lower lung. Electronically Signed   By: Rolm Baptise M.D.   On: 03/01/2019 07:49   Dg Chest Port 1 View  Result Date: 02/28/2019 CLINICAL DATA:  Shortness of breath.  Decreased oxygen saturation. EXAM: PORTABLE CHEST 1 VIEW COMPARISON:  11/11/2018. FINDINGS: The heart size is normal. There is aortic atherosclerosis identified. Blunting of left costophrenic angle identified which may reflect a small left pleural effusion. Diffuse bilateral reticular interstitial opacities are identified. No lobar consolidation identified. IMPRESSION: 1. Diffuse bilateral reticular interstitial opacities are identified. Findings may reflect pulmonary edema and/or atypical infection. Clinical correlation advised. 2. Suspect small left pleural effusion 3.  Aortic Atherosclerosis (ICD10-I70.0). Electronically Signed   By: Kerby Moors M.D.   On: 02/28/2019 02:07   Dg Foot 2 Views Left  Result Date: 02/28/2019 CLINICAL DATA:  Chronic left foot pain. The patient is currently being treated for left foot infection. EXAM: LEFT FOOT - 2 VIEW COMPARISON:  Plain films left foot 11/14/2018. FINDINGS: Bones are osteopenic. No bony destructive change or periosteal reaction to suggest osteomyelitis. No fracture or dislocation. No soft tissue gas or radiopaque foreign body. Atherosclerosis noted. IMPRESSION: No acute abnormality. Osteopenia. Electronically Signed   By: Inge Rise M.D.   On: 02/28/2019 09:09    (Echo, Carotid, EGD, Colonoscopy, ERCP)    Subjective: Sitting in bed awake alert no complaints   Discharge Exam: Vitals:   03/06/19 0620 03/06/19 0700  BP: (!) 176/73 (!) 155/56  Pulse: 69 60  Resp: 18   Temp: 97.8 F (36.6 C)   SpO2: 99%     Vitals:   03/05/19 1400 03/05/19 2121 03/06/19 0620 03/06/19 0700  BP: (!) 169/74 (!) 160/72 (!) 176/73 (!) 155/56  Pulse: 68 63 69 60  Resp: _0 Temp: 98.5 F (36.9 C) 98.2 F (36.8 C) 97.8 F (36.6 C)   TempSrc:  Oral    SpO2: 100% 98% 99%   Weight:      Height:        General: Pt is alert, awake, not in acute distress Cardiovascular: RRR, S1/S2 +, no rubs, no gallops Respiratory: CTA bilaterally, no wheezing, no rhonchi Abdominal: Soft, NT, ND, bowel sounds + Extremities: Left AKA   The results of significant diagnostics from this hospitalization (including imaging, microbiology, ancillary and laboratory) are listed below for reference.     Microbiology: Recent Results (from the past 240 hour(s))  Blood Culture (routine x 2)     Status: None   Collection Time: 02/28/19  1:50 AM  Result Value Ref Range Status   Specimen Description BLOOD RIGHT ARM  Final   Special Requests   Final    BOTTLES DRAWN AEROBIC AND ANAEROBIC Blood Culture adequate volume   Culture   Final    NO GROWTH 5 DAYS Performed at [facility], [address], [place], [place] [address]    Report Status 03/05/2019 FINAL  Final  Blood Culture (routine x 2)     Status: None   Collection Time: 02/28/19 12:16 PM  Result Value Ref Range Status   Specimen Description BLOOD RIGHT HAND  Final   Special Requests   Final    BOTTLES DRAWN AEROBIC ONLY Blood Culture results may not be optimal due to an inadequate volume of blood received in culture bottles   Culture   Final    NO GROWTH 5 DAYS Performed at [facility], [place] [address], [place], [place] [address]    Report Status 03/05/2019 FINAL  Final  Novel Coronavirus, NAA (hospital order; send-out to ref lab)     Status: None   Collection Time: 02/28/19  1:40 PM  Result Value Ref Range Status   SARS-CoV-2, NAA NOT DETECTED NOT DETECTED Final    Comment: Negative (Not Detected) results do not exclude infection caused by SARS CoV 2  and should not be used as the sole basis for treatment or other patient management decisions. Optimum specimen types and timing for peak viral levels during infections caused  by SARS CoV 2 have not been determined. Collection of multiple specimens (types and time points) from the same patient may be necessary to detect the virus. Improper specimen collection and handling, sequence variability underlying assay primers and or probes, or the presence of organisms in  quantities less than the limit of detection of the assay may lead to false negative results. Positive and negative predictive values of testing are highly dependent on prevalence. False negative results are more likely when prevalence of disease is high. (NOTE) The expected result is Negative (Not Detected). The SARS CoV 2 test is intended for the presumptive qualitative  detection of nucleic acid from SARS CoV 2 in upper and lower  respir atory specimens. Testing methodology is real time RT PCR. Test results must be correlated with clinical presentation and  evaluated in the context of other laboratory and epidemiologic data.  Test performance can be affected because the epidemiology and  clinical spectrum of infection caused by SARS CoV 2 is not fully  known. For example, the optimum types of specimens to collect and  when during the course of infection these specimens are most likely  to contain detectable viral RNA may not be known. This test has not been Food and Drug Administration (FDA) cleared or  approved and has been authorized by FDA under an Emergency Use  Authorization (EUA). The test is only authorized for the duration of  the declaration that circumstances exist justifying the authorization  of emergency use of in vitro diagnostic tests for detection and or  diagnosis of SARS CoV 2 under Section 564(b)(1) of the Act, 21 U.S.C.  section [phone] 3(b)(1), unless the authorization is terminated or   revoked sooner.  [facility] is certified under the  Clinical Laboratory Improvement Amendments of 1988 (CLIA), 42 U.S.C.  section [phone], to perform high complexity tests. Performed at [id number] [address], [place], [place] [address] Laboratory Director: Loleta Books, MD  Coronavirus Source NASOPHARYNGEAL  Final    Comment: Performed at [facility], [place] [address], [place], [place] [address]  Surgical pcr screen     Status: None   Collection Time: 03/01/19  9:53 AM  Result Value Ref Range Status   MRSA, PCR NEGATIVE NEGATIVE Final   Staphylococcus aureus NEGATIVE NEGATIVE Final    Comment: (NOTE) The Xpert SA Assay (FDA approved for NASAL specimens in patients 38 years of age and older), is one component of a comprehensive surveillance program. It is not intended to diagnose infection nor to guide or monitor treatment. Performed at [facility], [place] [address], [place], [place] [address]      Labs: BNP (last 3 results) Recent Labs    03/29/18 0146 08/19/18 1755  BNP >4,500.0* >7,048.8*   Basic Metabolic Panel: Recent Labs  Lab 02/28/19 0140 03/01/19 0655 03/01/19 1004 03/02/19 0421 03/03/19 0248 03/04/19 0414 03/04/19 0746 03/05/19 0305  NA 139  --  138 136  --   --  136  --   K 5.1  --  3.7 4.3  --   --  3.8  --   CL 94*  --  97* 99  --   --  97*  --   CO2 25  --  26 25  --   --  27  --   GLUCOSE 123*  --  78 121*  --   --  76  --   BUN 47*  --  20 33*  --   --  34*  --   CREATININE 9.15*  --  4.36* 5.49*  --   --  5.85*  --   CALCIUM 9.6  --  8.3* 8.2*  --   --  8.0*  --   MG  --  2.2  --  2.3 2.3 2.3  --  2.0  PHOS  --   --   --   --   --   --  2.3*  --    Liver Function Tests: Recent Labs  Lab 02/28/19 0140 03/01/19 1004 03/04/19 0746  AST 27 19  --   ALT 13 11  --   ALKPHOS 87 61  --   BILITOT 1.0 1.1  --   PROT 7.7 6.4*  --   ALBUMIN 2.6* 2.2* 2.1*   No results for  input(s): LIPASE, AMYLASE in the last 168 hours. No results for input(s): AMMONIA in the last 168 hours. CBC: Recent Labs  Lab 02/28/19 0140 03/01/19 1004 03/02/19 0627 03/03/19 0248 03/03/19 1305 03/04/19 0747 03/05/19 0651  WBC 14.0* 9.4 12.2* 8.4 8.7 7.4 8.0  NEUTROABS 11.2* 7.1 9.8*  --  5.7  --   --   HGB 10.9* 8.0* 7.5* 6.6* 8.2* 7.4* 8.4*  HCT 36.8* 24.8* 23.6* 20.6* 26.0* 23.8* 27.2*  MCV 91.1 87.0 86.4 85.5 85.0 85.9 85.5  PLT 236 183 192 192 183 174 205   Cardiac Enzymes: Recent Labs  Lab 02/28/19 0140 03/01/19 0655 03/02/19 0421  CKTOTAL  --  73 148  TROPONINI 0.20*  --   --    BNP: Invalid input(s): POCBNP CBG: Recent Labs  Lab 03/02/19 2122  GLUCAP 85   D-Dimer No results for input(s): DDIMER in the last 72 hours. Hgb A1c No results for input(s): HGBA1C in the last 72 hours. Lipid Profile No results for input(s): CHOL, HDL, LDLCALC, TRIG, CHOLHDL, LDLDIRECT in the last 72 hours. Thyroid function studies No results for  input(s): TSH, T4TOTAL, T3FREE, THYROIDAB in the last 72 hours.  Invalid input(s): FREET3 Anemia work up No results for input(s): VITAMINB12, FOLATE, FERRITIN, TIBC, IRON, RETICCTPCT in the last 72 hours. Urinalysis    Component Value Date/Time   COLORURINE YELLOW 04/04/2017 1833   APPEARANCEUR CLEAR 04/04/2017 1833   LABSPEC 1.008 04/04/2017 1833   PHURINE 9.0 (H) 04/04/2017 1833   GLUCOSEU 50 (A) 04/04/2017 1833   HGBUR MODERATE (A) 04/04/2017 1833   BILIRUBINUR NEGATIVE 04/04/2017 1833   KETONESUR NEGATIVE 04/04/2017 1833   PROTEINUR 100 (A) 04/04/2017 1833   UROBILINOGEN 0.2 01/08/2015 2116   NITRITE NEGATIVE 04/04/2017 1833   LEUKOCYTESUR NEGATIVE 04/04/2017 1833   Sepsis Labs Invalid input(s): PROCALCITONIN,  WBC,  LACTICIDVEN Microbiology Recent Results (from the past 240 hour(s))  Blood Culture (routine x 2)     Status: None   Collection Time: 02/28/19  1:50 AM  Result Value Ref Range Status   Specimen  Description BLOOD RIGHT ARM  Final   Special Requests   Final    BOTTLES DRAWN AEROBIC AND ANAEROBIC Blood Culture adequate volume   Culture   Final    NO GROWTH 5 DAYS Performed at [facility], [address] [place], [place] [address]    Report Status 03/05/2019 FINAL  Final  Blood Culture (routine x 2)     Status: None   Collection Time: 02/28/19 12:16 PM  Result Value Ref Range Status   Specimen Description BLOOD RIGHT HAND  Final   Special Requests   Final    BOTTLES DRAWN AEROBIC ONLY Blood Culture results may not be optimal due to an inadequate volume of blood received in culture bottles   Culture   Final    NO GROWTH 5 DAYS Performed at [facility], [place] [address], [place], [place] [address]    Report Status 03/05/2019 FINAL  Final  Novel Coronavirus, NAA (hospital order; send-out to ref lab)     Status: None   Collection Time: 02/28/19  1:40 PM  Result Value Ref Range Status   SARS-CoV-2, NAA NOT DETECTED NOT DETECTED Final    Comment: Negative (Not Detected) results do not exclude infection caused by SARS CoV 2 and should not be used as the sole basis for treatment or other patient management decisions. Optimum specimen types and timing for peak viral levels during infections caused  by SARS CoV 2 have not been determined. Collection of multiple specimens (types and time points) from the same patient may be necessary to detect the virus. Improper specimen collection and handling, sequence variability underlying assay primers and or probes, or the presence of organisms in  quantities less than the limit of detection of the assay may lead to false negative results. Positive and negative predictive values of testing are highly dependent on prevalence. False negative results are more likely when prevalence of disease is high. (NOTE) The expected result is Negative (Not Detected). The SARS CoV 2 test is intended for the presumptive qualitative  detection of nucleic  acid from SARS CoV 2 in upper and lower  respir atory specimens. Testing methodology is real time RT PCR. Test results must be correlated with clinical presentation and  evaluated in the context of other laboratory and epidemiologic data.  Test performance can be affected because the epidemiology and  clinical spectrum of infection caused by SARS CoV 2 is not fully  known. For example, the optimum types of specimens to collect and  when during the  course of infection these specimens are most likely  to contain detectable viral RNA may not be known. This test has not been Food and Drug Administration (FDA) cleared or  approved and has been authorized by FDA under an Emergency Use  Authorization (EUA). The test is only authorized for the duration of  the declaration that circumstances exist justifying the authorization  of emergency use of in vitro diagnostic tests for detection and or  diagnosis of SARS CoV 2 under Section 564(b)(1) of the Act, 21 U.S.C.  section [id number] 3(b)(1), unless the authorization is terminated or   revoked sooner. [facility] is certified under the  Clinical Laboratory Improvement Amendments of 1988 (CLIA), 42 U.S.C.  section [id number], to perform high complexity tests. Performed at [id number] [address], [place], [place] [address] Laboratory Director: Loleta Books, MD    Coronavirus Source NASOPHARYNGEAL  Final    Comment: Performed at [facility], [place] [address] [place], [place] [address]  Surgical pcr screen     Status: None   Collection Time: 03/01/19  9:53 AM  Result Value Ref Range Status   MRSA, PCR NEGATIVE NEGATIVE Final   Staphylococcus aureus NEGATIVE NEGATIVE Final    Comment: (NOTE) The Xpert SA Assay (FDA approved for NASAL specimens in patients 46 years of age and older), is one component of a comprehensive surveillance program. It is not intended to diagnose  infection nor to guide or monitor treatment. Performed at [facility], [place] [address] [place], [place] [address]      Time coordinating discharge: 34  minutes  SIGNED:   [name], MD  [facility] 03/06/2019, 8:12 AM Pager   If 7PM-7AM, please contact night-coverage [email or web address] Password [email or web address]

## 2019-03-06 NOTE — TOC Initial Note (Deleted)
Transition of Care [facility]) - Initial/Assessment Note    Patient Details  Name: Trevor [name] MRN: [id number] Date of Birth: March 22, 1946  Transition [facility]) CM/SW Contact:    Gabrielle Dare Phone [phone] 03/06/2019, 9:13 AM  Clinical Narrative:       CSW spoke with pt's daughter [name] due to pt's orientation.  Pt gave CSW verbal permission to speak with a family member.  Pt's daughter would like for pt to return to [facility] at discharge.  Pt's daughter expressed understanding of CSW role in the discharging process.  Pt's daughter had no questions regarding medical treatment or plan.           Expected Discharge Plan: Skilled Nursing Facility Barriers to Discharge: No Barriers Identified   Patient Goals and CMS Choice Patient states their goals for this hospitalization and ongoing recovery are:: Patient did not state goal.  Patient stated " I am confused". CMS Medicare.gov Compare Post Acute Care list provided to:: Other (Comment Required)(Patient will be returning to previous [name] [facility]) Choice offered to / list presented to : NA  Expected Discharge Plan and Services Expected Discharge Plan: [place] In-house Referral: Clinical Social Work Discharge Planning Services: NA Post Acute Care Choice: [place] Living arrangements for the past 2 months: [place] Expected Discharge Date: 03/06/19                 DME Agency: NA HH Arranged: NA HH Agency: NA  Prior Living Arrangements/Services Living arrangements for the past 2 months: [place] Lives with:: Facility Resident Patient language and need for interpreter reviewed:: No Do you feel safe going back to the place where you live?: Yes      Need for Family Participation in Patient Care: Yes (Comment) Care giver support system in place?: Yes (comment) Current home services: Other (comment)(NA) Criminal Activity/Legal Involvement Pertinent to  Current Situation/Hospitalization: No - Comment as needed  Activities of Daily Living      Permission Sought/Granted Permission sought to share information with : Family Supports, Chartered certified accountant granted to share information with : Yes, Verbal Permission Granted  Share Information with NAME: Haston Casebolt  Permission granted to share info w AGENCY: [facility]  Permission granted to share info w Relationship: Son  Permission granted to share info w Contact Information: yes  Emotional Assessment Appearance:: Appears stated age Attitude/Demeanor/Rapport: Gracious, Unable to Assess Affect (typically observed): Calm, Appropriate Orientation: : Oriented to Self, Oriented to Place, Oriented to  Time Alcohol / Substance Use: Not Applicable Psych Involvement: No (comment)  Admission diagnosis:  Gangrene ([place]) [I96] Anemia associated with chronic renal failure [N18.9, D63.1] End-stage renal disease on hemodialysis ([place]) [N18.6, Z99.2] Elevated troponin I level [R79.89] Community acquired pneumonia, unspecified laterality [J18.9] Sepsis without acute organ dysfunction, due to unspecified organism [facility]) [A41.9] Patient Active Problem List   Diagnosis Date Noted  . Anemia associated with chronic renal failure   . Abnormal CXR 02/28/2019  . Marijuana abuse 02/28/2019  . Congestive heart failure (CHF) ([place]) 02/28/2019  . Cellulitis 02/28/2019  . Sepsis ([place]) 02/28/2019  . STEMI (ST elevation myocardial infarction) ([place]) 11/11/2018  . NSTEMI (non-ST elevated myocardial infarction) ([place]) 11/11/2018  . Pulmonary edema 09/20/2018  . Acute respiratory failure with hypoxia ([place]) 03/29/2018  . ESRD (end stage renal disease) ([place]) 03/29/2018  . Sleep stage dysfunction 11/10/2017  . 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 Vascular device, implant, or graft complication 17/49/4496  . Peripheral arterial disease (  [place]) 10/20/2017  . Non-allergic rhinitis 10/20/2017  . 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 BPH (benign prostatic hyperplasia) 07/07/2017  . At risk for adverse drug reaction 07/02/2017  . Ischemia of extremity 06/28/2017  . Acute pulmonary edema ([place]) 06/27/2017  . Hypertensive urgency 01/24/2017  . 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 Ischemic chest pain ([place]) 11/01/2016  . Essential hypertension 03/08/2016  . Dyslipidemia 01/12/2016  . Chest pain 08/12/2015  . Accelerated hypertension 08/12/2015  . Carotid stenosis   . Abdominal aortic aneurysm ([place]) 03/06/2015  . Protein-calorie malnutrition, severe ([place]) 05/20/2014  . End-stage renal disease on hemodialysis ([place]) 05/19/2014  . Anemia of renal disease 05/19/2014  . Coronary artery disease, non-occlusive:  09/28/2012  . Secondary hyperparathyroidism ([place]) 09/27/2012  . Non compliance with medical treatment 09/27/2012  . Acute on chronic combined systolic and diastolic CHF (congestive heart failure) ([place]) 10/10/2011  . A-fib ([place]) 10/08/2011  . History of CVA (cerebrovascular accident) 10/07/2011  . Cocaine abuse ([place]) 10/07/2011  . Tobacco abuse 08/07/2011  . Bruit 08/07/2011   PCP:  Clinic, [place]:  No Pharmacies Listed    Social Determinants of Health (SDOH) Interventions    Readmission Risk Interventions No flowsheet data found.

## 2019-03-11 ENCOUNTER — Telehealth: Payer: Self-pay

## 2019-03-11 NOTE — Telephone Encounter (Signed)
Called nursing home and patients nurse [place] said she would be there on Tuesday for his visit to speak with PA. She stated patient was out a t dialysis and would not be back until 4-5pm.  I will contact patient later to get his consent.

## 2019-03-14 ENCOUNTER — Telehealth: Payer: Self-pay | Admitting: Cardiology

## 2019-03-14 NOTE — Telephone Encounter (Signed)
   Cardiac Questionnaire:    Since your last visit or hospitalization:    1. Have you been having new or worsening chest pain? NO    2. Have you been having new or worsening shortness of breath? NO 3. Have you been having new or worsening leg swelling, wt gain, or increase in abdominal girth (pants fitting more tightly)? NO   4. Have you had any passing out spells? NO    *A YES to any of these questions would result in the appointment being kept. *If all the answers to these questions are NO, we should indicate that given the current situation regarding the worldwide coronarvirus pandemic, at the recommendation of the CDC, we are looking to limit gatherings in our waiting area, and thus will reschedule their appointment beyond four weeks from today.   _____________   XYIAX-65 Pre-Screening Questions:  . Do you currently have a fever? NO (yes = cancel and refer to pcp for e-visit) . Have you recently travelled on a cruise, internationally, or to [place], [place], [place], [place], [place], or [place], [place] [employer]) ? NO (yes = cancel, stay home, monitor symptoms, and contact pcp or initiate e-visit if symptoms develop) . Have you been in contact with someone that is currently pending confirmation of Covid19 testing or has been confirmed to have the [place] virus?  NO (yes = cancel, stay home, away from tested individual, monitor symptoms, and contact pcp or initiate e-visit if symptoms develop) . Are you currently experiencing fatigue or cough? NO (yes = pt should be prepared to have a mask placed at the time of their visit).       SPOKE WITH BRITTANY AT [facility] AND SHE ANSWERED QUESTIONS ON PATIENTS BEHALF. SHE STATED PATIENT HAS LOST HIS PHONE AND GAVE CONSENT FOR HIS PHONE VISIT TOMORROW.

## 2019-03-14 NOTE — Telephone Encounter (Signed)
Mychart pending, no smartphone, pre reg complete 03/14/19 AF

## 2019-03-14 NOTE — Telephone Encounter (Signed)
Virtual Visit Pre-Appointment Phone Call  Steps For Call:  1. Confirm consent - "In the setting of the current Covid19 crisis, you are scheduled for a PHONE visit with your provider on 03/15/2019 at 10:00AM.  Just as we do with many in-office visits, in order for you to participate in this visit, we must obtain consent.  If you'd like, I can send this to your mychart (if signed up) or email for you to review.  Otherwise, I can obtain your verbal consent now.  All virtual visits are billed to your insurance company just like a normal visit would be.  By agreeing to a virtual visit, we'd like you to understand that the technology does not allow for your provider to perform an examination, and thus may limit your provider's ability to fully assess your condition.  Finally, though the technology is pretty good, we cannot assure that it will always work on either your or our end, and in the setting of a video visit, we may have to convert it to a phone-only visit.  In either situation, we cannot ensure that we have a secure connection.  Are you willing to proceed?"  2. Give patient instructions for WebEx download to smartphone as below if video visit  3. Advise patient to be prepared with any vital sign or heart rhythm information, their current medicines, and a piece of paper and pen handy for any instructions they may receive the day of their visit  4. Inform patient they will receive a phone call 15 minutes prior to their appointment time (may be from unknown caller ID) so they should be prepared to answer  5. Confirm that appointment type is correct in Epic appointment notes (video vs telephone)    TELEPHONE CALL NOTE  Trevor [name] has been deemed a candidate for a follow-up tele-health visit to limit community exposure during the Covid-19 pandemic. I spoke with the patient via phone to ensure availability of phone/video source, confirm preferred email & phone number, and discuss  instructions and expectations.  I reminded Trevor [name] to be prepared with any vital sign and/or heart rhythm information that could potentially be obtained via home monitoring, at the time of his visit. I reminded Trevor [name] to expect a phone call at the time of his visit if his visit.  Did the patient verbally acknowledge consent to treatment? YES  Harold Hedge, CMA 03/14/2019 11:51 AM   DOWNLOADING THE WEBEX SOFTWARE TO SMARTPHONE  - If Apple, go to [employer] and type in WebEx in the search bar. [place] [employer], the blue/green circle. The app is free but as with any other app downloads, their phone may require them to verify saved payment information or Apple password. The patient does NOT have to create an account.  - If Android, ask patient to go to [employer] and type in WebEx in the search bar. [place] [employer], the blue/green circle. The app is free but as with any other app downloads, their phone may require them to verify saved payment information or Android password. The patient does NOT have to create an account.   CONSENT FOR TELE-HEALTH VISIT - PLEASE REVIEW  I hereby voluntarily request, consent and authorize [facility] and its employed or contracted physicians, physician assistants, nurse practitioners or other licensed health care professionals (the Practitioner), to provide me with telemedicine health care services (the "Services") as deemed necessary by the treating Practitioner. I acknowledge  and consent to receive the Services by the Practitioner via telemedicine. I understand that the telemedicine visit will involve communicating with the Practitioner through live audiovisual communication technology and the disclosure of certain medical information by electronic transmission. I acknowledge that I have been given the opportunity to request an in-person assessment or other available alternative prior to the telemedicine visit  and am voluntarily participating in the telemedicine visit.  I understand that I have the right to withhold or withdraw my consent to the use of telemedicine in the course of my care at any time, without affecting my right to future care or treatment, and that the Practitioner or I may terminate the telemedicine visit at any time. I understand that I have the right to inspect all information obtained and/or recorded in the course of the telemedicine visit and may receive copies of available information for a reasonable fee.  I understand that some of the potential risks of receiving the Services via telemedicine include:  Marland Kitchen Delay or interruption in medical evaluation due to technological equipment failure or disruption; . Information transmitted may not be sufficient (e.g. poor resolution of images) to allow for appropriate medical decision making by the Practitioner; and/or  . In rare instances, security protocols could fail, causing a breach of personal health information.  Furthermore, I acknowledge that it is my responsibility to provide information about my medical history, conditions and care that is complete and accurate to the best of my ability. I acknowledge that Practitioner's advice, recommendations, and/or decision may be based on factors not within their control, such as incomplete or inaccurate data provided by me or distortions of diagnostic images or specimens that may result from electronic transmissions. I understand that the practice of medicine is not an exact science and that Practitioner makes no warranties or guarantees regarding treatment outcomes. I acknowledge that I will receive a copy of this consent concurrently upon execution via email to the email address I last provided but may also request a printed copy by calling the office of [facility].    I understand that my insurance will be billed for this visit.   I have read or had this consent read to me. . I understand  the contents of this consent, which adequately explains the benefits and risks of the Services being provided via telemedicine.  . I have been provided ample opportunity to ask questions regarding this consent and the Services and have had my questions answered to my satisfaction. . I give my informed consent for the services to be provided through the use of telemedicine in my medical care  By participating in this telemedicine visit I agree to the above.

## 2019-03-15 ENCOUNTER — Telehealth (INDEPENDENT_AMBULATORY_CARE_PROVIDER_SITE_OTHER): Payer: Medicare Other | Admitting: Cardiology

## 2019-03-15 ENCOUNTER — Telehealth: Payer: Self-pay

## 2019-03-15 ENCOUNTER — Encounter: Payer: Self-pay | Admitting: Cardiology

## 2019-03-15 VITALS — BP 140/80 | HR 86 | Temp 98.4°F | Resp 18 | Ht 70.0 in | Wt 112.0 lb

## 2019-03-15 DIAGNOSIS — Z992 Dependence on renal dialysis: Secondary | ICD-10-CM | POA: Diagnosis not present

## 2019-03-15 DIAGNOSIS — I119 Hypertensive heart disease without heart failure: Secondary | ICD-10-CM | POA: Diagnosis not present

## 2019-03-15 DIAGNOSIS — I739 Peripheral vascular disease, unspecified: Secondary | ICD-10-CM

## 2019-03-15 DIAGNOSIS — I679 Cerebrovascular disease, unspecified: Secondary | ICD-10-CM | POA: Diagnosis not present

## 2019-03-15 DIAGNOSIS — J449 Chronic obstructive pulmonary disease, unspecified: Secondary | ICD-10-CM | POA: Diagnosis not present

## 2019-03-15 DIAGNOSIS — H547 Unspecified visual loss: Secondary | ICD-10-CM

## 2019-03-15 DIAGNOSIS — N186 End stage renal disease: Secondary | ICD-10-CM

## 2019-03-15 DIAGNOSIS — I252 Old myocardial infarction: Secondary | ICD-10-CM

## 2019-03-15 DIAGNOSIS — I10 Essential (primary) hypertension: Secondary | ICD-10-CM

## 2019-03-15 NOTE — Telephone Encounter (Signed)
Spoke with [place] patients nurse at nursing home and informed her of AVS instructions and that a copy of the AVS would be mailed to the patient at the facility. She thanked me for my call and voiced understanding.

## 2019-03-15 NOTE — Patient Instructions (Addendum)
Medication Instructions:  Your physician recommends that you continue on your current medications as directed. Please refer to the Current Medication list given to you today. If you need a refill on your cardiac medications before your next appointment, please call your pharmacy.   Lab work: None  If you have labs (blood work) drawn today and your tests are completely normal, you will receive your results only by: Marland Kitchen MyChart Message (if you have MyChart) OR . A paper copy in the mail If you have any lab test that is abnormal or we need to change your treatment, we will call you to review the results.  Testing/Procedures: None   Follow-Up: At [facility], you and your health needs are our priority.  As part of our continuing mission to provide you with exceptional heart care, we have created designated Provider Care Teams.  These Care Teams include your primary Cardiologist (physician) and Advanced Practice Providers (APPs -  Physician Assistants and Nurse Practitioners) who all work together to provide you with the care you need, when you need it.  Your physician recommends that you schedule a follow-up appointment in: 6 months with [name], PA-C (one of the schedulers will contact you at a later date to schedule your appointment.)  Any Other Special Instructions Will Be Listed Below (If Applicable).

## 2019-03-15 NOTE — Progress Notes (Signed)
Virtual Visit via Telephone Note   This visit type was conducted due to national recommendations for restrictions regarding the COVID-19 Pandemic (e.g. social distancing) in an effort to limit this patient's exposure and mitigate transmission in our community.  Due to his co-morbid illnesses, this patient is at least at moderate risk for complications without adequate follow up.  This format is felt to be most appropriate for this patient at this time.  The patient did not have access to video technology/had technical difficulties with video requiring transitioning to audio format only (telephone).  All issues noted in this document were discussed and addressed.  No physical exam could be performed with this format.  Please refer to the patient's chart for his  consent to telehealth for [facility].  Evaluation Performed:  Follow-up visit  This visit type was conducted due to national recommendations for restrictions regarding the COVID-19 Pandemic (e.g. social distancing).  This format is felt to be most appropriate for this patient at this time.  All issues noted in this document were discussed and addressed.  No physical exam was performed (except for noted visual exam findings with Video Visits).  Please refer to the patient's chart (MyChart message for video visits and phone note for telephone visits) for the patient's consent to telehealth for [facility].  Date:  03/15/2019   ID:  Trevor [name], DOB 06-Dec-1945, MRN [id number]  Patient Location: [place] [address]   Provider location:   [facility]  PCP[facility]  [facility]  Cardiologist:  Kirk Ruths, MD  Electrophysiologist:  None   Chief Complaint:  Routine OV  History of Present Illness:    Trevor [name] is a 73 y.o. male who presents via audio/video conferencing for a telehealth visit today.  I spoke with his nurse at his [name].   The patient is a 73 y/o blind AA  male with a history of ESRD on HD. He resides at [facility] facility.  His usual dialysis days are MWF at [place].  He has a history of non critcal CAD by prior cath.  He was admitted 11/11/18 with chest pain and ruled in for a STEMI.  His troponin peaked at 10.59. Coronary angiogram was attempted but they were unable to get access. Echo showed his EF to be 45-50% with moderate LVH.  His LOV was Dec 2019 after his MI.  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  He tolerated this well (not seen by cardiology during that admission).  His nurse tells me that since his AKA he has done better.  He was a little confused post hospital but that has stabilized.  He is not complaining of chest pain or SOB.  She told me that prior to his AKA she thinks he was not following his fluid restriction.      The patient does not symptoms concerning for COVID-19 infection (fever, chills, cough, or new SHORTNESS OF BREATH).    Prior CV studies:   The following studies were reviewed today:   Past Medical History:  Diagnosis Date  . AAA (abdominal aortic aneurysm) ([place])   . Anemia   . Anxiety   . Arthritis    arms and back  . Blindness   . CHF (congestive heart failure) ([place])   . Coronary artery disease    mild   . 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 ESRD on hemodialysis ([place])    started HD 2014-15  . Headache(784.0)   . HTN (hypertension)   . Protein-calorie malnutrition ([place])   . STEMI (ST elevation myocardial infarction) ([place]) 11/11/2018  . Stroke [facility]) 01/2018   no residual deficits   Past Surgical History:  Procedure Laterality Date  . A/V FISTULAGRAM N/A 07/29/2018   Procedure: A/V FISTULAGRAM - left upper extremity;  Surgeon: Waynetta Sandy, MD;  Location: [place] [facility];  Service: Cardiovascular;  Laterality: N/A;  . AMPUTATION Left 03/01/2019   Procedure: AMPUTATION ABOVE KNEE;  Surgeon:  Angelia Mould, MD;  Location: [place];  Service: Vascular;  Laterality: Left;  . AXILLARY-FEMORAL BYPASS GRAFT  06/28/2017   Procedure: BYPASS GRAFT RIGHT AXILLA-BIFEMORAL USING 8MM X 30CM AND 8MM X 60CM HEMASHIELD GOLD GRAFTS;  Surgeon: Rosetta Posner, MD;  Location: [place];  Service: Vascular;;  . CARDIAC CATHETERIZATION N/A 11/01/2016   Procedure: Left Heart Cath and Coronary Angiography;  Surgeon: Lorretta Harp, MD;  Location: [place] CV LAB;  Service: Cardiovascular;  Laterality: N/A;  . ENDARTERECTOMY FEMORAL Bilateral 06/28/2017   Procedure: ENDARTERECTOMY BILATERAL FEMORAL ARTERIES;  Surgeon: Rosetta Posner, MD;  Location: [place];  Service: Vascular;  Laterality: Bilateral;  . FISTULA SUPERFICIALIZATION Left 6/96/2952   Procedure: PLICATION OF ARTERIOVENOUS FISTULA LEFT ARM;  Surgeon: Waynetta Sandy, MD;  Location: [place];  Service: Vascular;  Laterality: Left;  . FISTULOGRAM Left 05/20/2018   Procedure: FISTULOGRAM WITH BALLOON ANGIOPLASTY LEFT ARM ARTERIOVENOUS FISTULA;  Surgeon: Waynetta Sandy, MD;  Location: [place];  Service: Vascular;  Laterality: Left;  . INSERTION OF DIALYSIS CATHETER  09/21/2012   Procedure: INSERTION OF DIALYSIS CATHETER;  Surgeon: Angelia Mould, MD;  Location: [place];  Service: Vascular;  Laterality: N/A;  Right Internal Jugular Placement  . LEFT HEART CATH N/A 11/11/2018   Procedure: LEFT HEART CATH;  Surgeon: Sherren Mocha, MD;  Location: [place] [facility];  Service: Cardiovascular;  Laterality: N/A;  . LEFT HEART CATHETERIZATION WITH CORONARY ANGIOGRAM N/A 09/28/2012   Procedure: LEFT HEART CATHETERIZATION WITH CORONARY ANGIOGRAM;  Surgeon: Sherren Mocha, MD;  Location[facility] CATH LAB;  Service: Cardiovascular;  Laterality: N/A;  . LEFT HEART CATHETERIZATION WITH CORONARY ANGIOGRAM N/A 07/07/2014   Procedure: LEFT HEART CATHETERIZATION WITH CORONARY ANGIOGRAM;  Surgeon: Leonie Man, MD;  Location: [facility] CATH LAB;  Service:  Cardiovascular;  Laterality: N/A;  . PERIPHERAL VASCULAR BALLOON ANGIOPLASTY Left 07/29/2018   Procedure: PERIPHERAL VASCULAR BALLOON ANGIOPLASTY;  Surgeon: Waynetta Sandy, MD;  Location: [place] CV [facility];  Service: Cardiovascular;  Laterality: Left;  CENTRAL VEIN  . PERIPHERAL VASCULAR INTERVENTION Left 07/29/2018   Procedure: PERIPHERAL VASCULAR INTERVENTION;  Surgeon: Waynetta Sandy, MD;  Location: [place] [facility];  Service: Cardiovascular;  Laterality: Left;  AXILLARY VEIN  . REVISION OF ARTERIOVENOUS GORETEX GRAFT Left 12/30/2018   Procedure: REVISION OF ARTERIOVENOUS FISTULA LEFT ARM;  Surgeon: Rosetta Posner, MD;  Location: [place];  Service: Vascular;  Laterality: Left;  . REVISON OF ARTERIOVENOUS FISTULA Left 08/17/2018   Procedure: REVISION OF ARTERIOVENOUS FISTULA LEFT ARM;  Surgeon: Waynetta Sandy, MD;  Location: [place];  Service: Vascular;  Laterality: Left;  . right hand    . TEE WITHOUT CARDIOVERSION  10/09/2011   Procedure: TRANSESOPHAGEAL ECHOCARDIOGRAM (TEE);  Surgeon: Lelon Perla, MD;  [facility] ENDOSCOPY;  Service: Cardiovascular;  Laterality: N/A;     Current Meds  Medication Sig  . aspirin EC 81 MG tablet Take 1 tablet (81 mg total) by mouth daily.  Marland Kitchen atorvastatin (LIPITOR) 80 MG tablet Take 1 tablet (80 mg total) by mouth at bedtime.  . bisacodyl (DULCOLAX) 10 MG suppository Place 10 mg rectally daily as needed (constipation).   . Cholecalciferol (D3-1000) 25 MCG (1000 UT) tablet Take 1,000 Units by mouth daily.  . cinacalcet (SENSIPAR) 90 MG tablet Take 90 mg by mouth daily with supper.  . clopidogrel (PLAVIX) 75 MG tablet Take 1 tablet (75 mg total) by mouth daily.  Marland Kitchen docusate sodium (COLACE) 100 MG capsule Take 200 mg by mouth at bedtime.   Marland Kitchen doxercalciferol (HECTOROL) 4 MCG/2ML injection Inject 1 mL (2 mcg total) into the vein every Monday, Wednesday, and Friday with hemodialysis.  Marland Kitchen gabapentin (NEURONTIN) 300 MG capsule Take 1  capsule (300 mg total) by mouth at bedtime.  . Glycopyrrolate (LONHALA MAGNAIR REFILL KIT) 25 MCG/ML SOLN Inhale 25 mcg into the lungs 2 (two) times daily.  Vanessa Kick Ethyl (VASCEPA) 1 g CAPS Take 1 g by mouth 2 (two) times daily.   Marland Kitchen ipratropium-albuterol (DUONEB) 0.5-2.5 (3) MG/3ML SOLN Take 3 mLs by nebulization every 8 (eight) hours as needed (for copd).  . isosorbide mononitrate (IMDUR) 30 MG 24 hr tablet Take 1 tablet (30 mg total) by mouth daily.  . metoprolol tartrate (LOPRESSOR) 25 MG tablet Take 0.5 tablets (12.5 mg total) by mouth 2 (two) times daily.  . mirtazapine (REMERON) 7.5 MG tablet Take 7.5 mg by mouth at bedtime.  . nitroGLYCERIN (NITROSTAT) 0.4 MG SL tablet Place 1 tablet (0.4 mg total) under the tongue every 5 (five) minutes x 3 doses as needed for chest pain.  . Nutritional Supplements (ENSURE PO) Take 237 mLs by mouth 2 (two) times daily.   . Nutritional Supplements (FEEDING SUPPLEMENT, NEPRO CARB STEADY,) LIQD Take 237 mLs by mouth 2 (two) times daily between meals.  . ondansetron (ZOFRAN) 4 MG tablet Take 4 mg by mouth every 6 (six) hours as needed for nausea or vomiting.  Marland Kitchen oxyCODONE (OXY IR/ROXICODONE) 5 MG immediate release tablet Take 1 tablet (5 mg total) by mouth every 6 (six) hours as needed for severe pain.  Marland Kitchen senna (SENOKOT) 8.6 MG TABS tablet Take 1 tablet by mouth daily.  . sevelamer carbonate (RENVELA) 800 MG tablet Take 1,600 mg by mouth 3 (three) times daily with meals.      Allergies:   Patient has no known allergies.   Social History   Tobacco Use  . Smoking status: Current Every Day Smoker    Packs/day: 0.50    Years: 60.00    Pack years: 30.00    Types: Cigarettes  . Smokeless tobacco: Never Used  . Tobacco comment: 5-6 cigarettes per day  Substance Use Topics  . Alcohol use: No    Alcohol/week: 0.0 standard drinks    Comment: Occasional  . Drug use: No    Frequency: 2.0 times per week    Types: "Crack" cocaine     Family Hx: The  patient's family history includes Alcohol abuse in his father; Diabetes in his mother; Heart attack in his mother. There is no history of Anesthesia problems, Hypotension, Malignant hyperthermia, or Pseudochol deficiency.  ROS:   Please see the history of present illness.    All other systems reviewed and are negative.   Labs/Other Tests and Data Reviewed:    Recent Labs: 08/19/2018: B Natriuretic Peptide >4,500.0 11/11/2018: TSH 1.331 03/01/2019: ALT  11 03/04/2019: BUN 34; Creatinine, Ser 5.85; Potassium 3.8; Sodium 136 03/05/2019: Hemoglobin 8.4; Magnesium 2.0; Platelets 205   Recent Lipid Panel Lab Results  Component Value Date/Time   CHOL 109 11/11/2018 06:15 AM   TRIG 108 11/11/2018 06:15 AM   HDL 32 (L) 11/11/2018 06:15 AM   CHOLHDL 3.4 11/11/2018 06:15 AM   LDLCALC 55 11/11/2018 06:15 AM    Wt Readings from Last 3 Encounters:  03/15/19 112 lb (50.8 kg)  03/05/19 113 lb 5.1 oz (51.4 kg)  01/18/19 126 lb 1.7 oz (57.2 kg)     Exam:    Vital Signs:  BP 140/80   Pulse 86   Temp 98.4 F (36.9 C)   Resp 18   Ht 5' 10"  (1.778 m)   Wt 112 lb (50.8 kg)   SpO2 94%   BMI 16.07 kg/m     ASSESSMENT & PLAN:     H/O STEMI  12/19-12/16/19 with STEMI- Troponin 10. Unable to obtain vascular access for cath- medical Rx. EF 45-50% by echo  Coronary artery disease: Prior non occlusive CAD-60% Dx1 and 60% CFX at cath 2013-2015-and Dec 2017  Essential hypertension He was hypotensive in Dec- now borderline hypertensive-B/P fluctuations most likely related to HD  End-stage renal disease on hemodialysis ([place]) HD since 2014, MWF at [facility]- Dr Marval Regal follows  HLD On high dose statin Rx  COVID-19 Education: The signs and symptoms of COVID-19 were discussed with the patient and how to seek care for testing (follow up with PCP or arrange E-visit).  The importance of social distancing was discussed today.  Patient Risk:   After full review of this patients clinical status,  I feel that they are at least moderate risk at this time.  Time:   Today, I have spent 15 minutes with the patient's nurse with telehealth technology discussing the patients history of current medications.     Medication Adjustments/Labs and Tests Ordered: Current medicines are reviewed at length with the patient today.  Concerns regarding medicines are outlined above.  Tests Ordered: No orders of the defined types were placed in this encounter.  Medication Changes: No orders of the defined types were placed in this encounter.   Disposition:  Me in 6 months  Signed, [name], Hershal Coria  03/15/2019 10:45 AM    [place]

## 2019-03-18 DIAGNOSIS — E1151 Type 2 diabetes mellitus with diabetic peripheral angiopathy without gangrene: Secondary | ICD-10-CM | POA: Diagnosis not present

## 2019-03-18 DIAGNOSIS — I739 Peripheral vascular disease, unspecified: Secondary | ICD-10-CM | POA: Diagnosis not present

## 2019-03-18 DIAGNOSIS — E785 Hyperlipidemia, unspecified: Secondary | ICD-10-CM | POA: Diagnosis not present

## 2019-03-18 DIAGNOSIS — D649 Anemia, unspecified: Secondary | ICD-10-CM | POA: Diagnosis not present

## 2019-03-20 IMAGING — DX DG CHEST 1V PORT
1 series · 1 of 1 positions shown · non-contrast
Comparison: 04/19/2018, 03/29/2018, 01/11/2018, 12/23/2017

CLINICAL DATA: Shortness of breath

EXAM:
PORTABLE CHEST 1 VIEW

[chest]
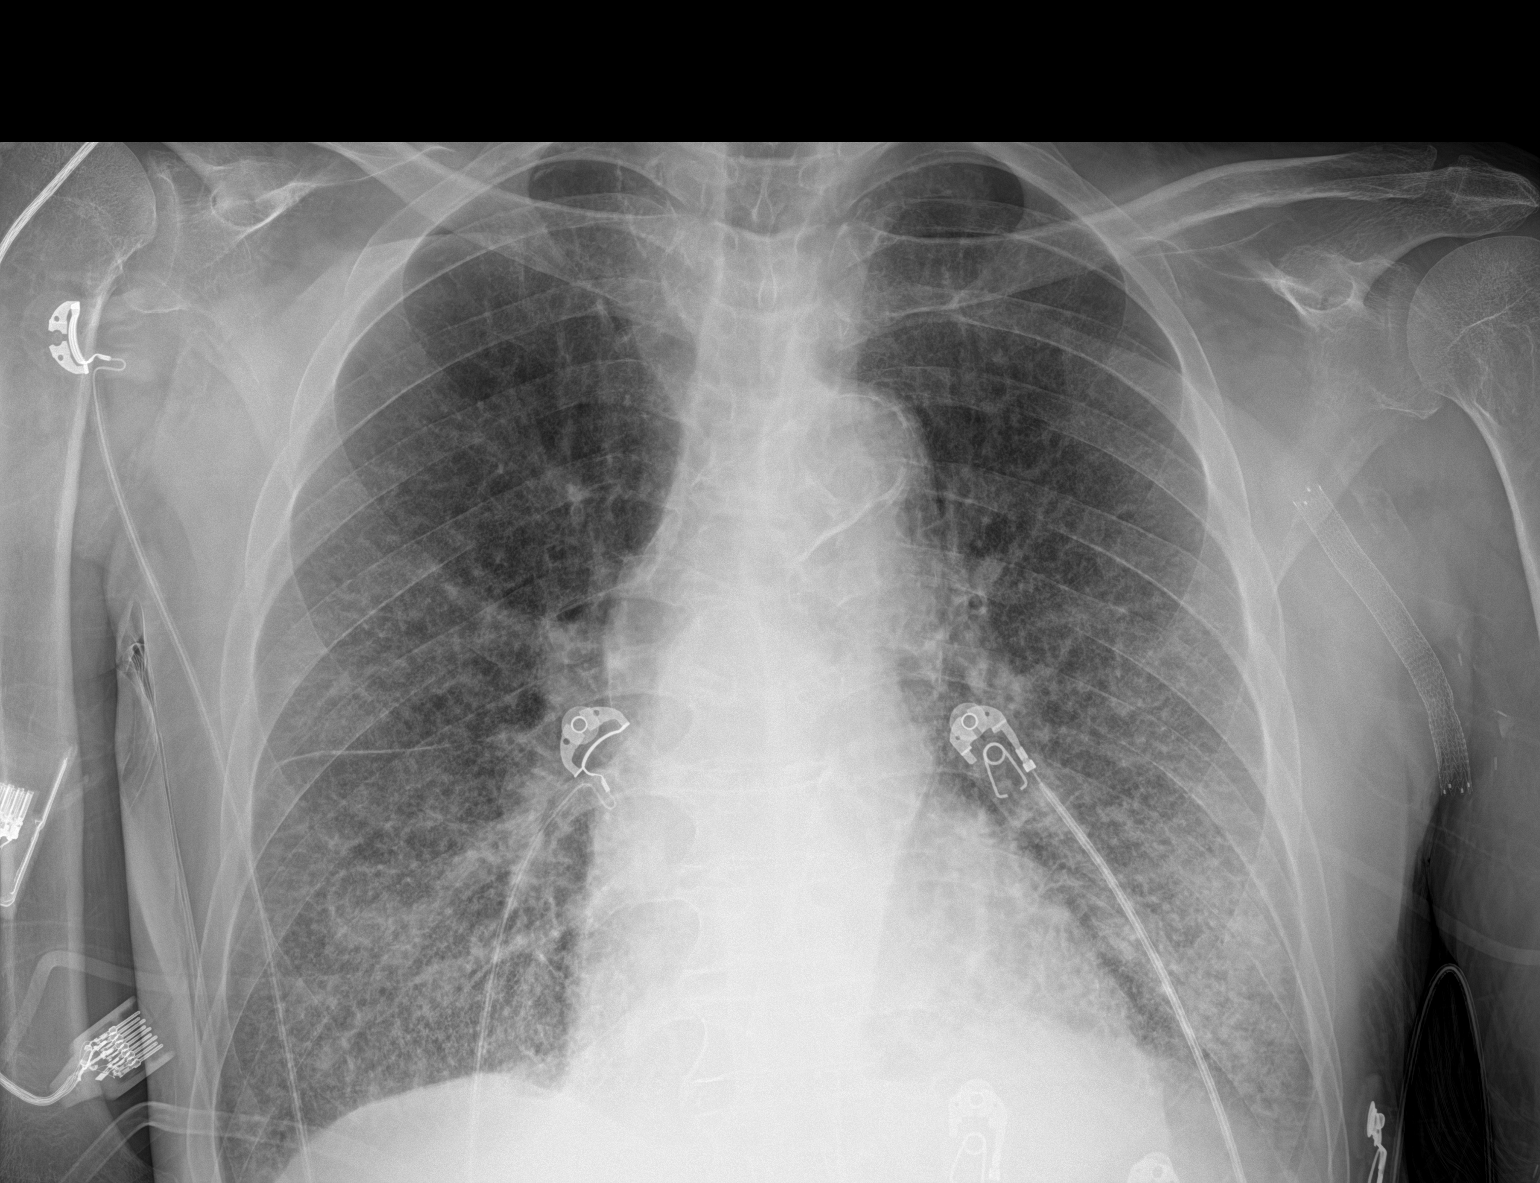

[1 of 1 positions shown; findings below may reference images not displayed]

FINDINGS: Stable cardiomediastinal silhouette. Worsened diffuse reticular and
interstitial opacity since prior radiograph. Aortic atherosclerosis.
No pneumothorax.
IMPRESSION: Diffusely increased interstitial and reticular opacity which may
reflect interstitial edema or inflammatory process; findings have
slightly worsened since 04/19/2018.

## 2019-04-01 DIAGNOSIS — R296 Repeated falls: Secondary | ICD-10-CM | POA: Diagnosis not present

## 2019-04-01 DIAGNOSIS — M6281 Muscle weakness (generalized): Secondary | ICD-10-CM | POA: Diagnosis not present

## 2019-04-06 ENCOUNTER — Other Ambulatory Visit: Payer: Self-pay

## 2019-04-06 ENCOUNTER — Encounter: Payer: Self-pay | Admitting: Vascular Surgery

## 2019-04-06 ENCOUNTER — Ambulatory Visit (INDEPENDENT_AMBULATORY_CARE_PROVIDER_SITE_OTHER): Payer: Self-pay | Admitting: Vascular Surgery

## 2019-04-06 VITALS — BP 146/68 | HR 78 | Temp 97.3°F | Resp 18 | Ht 70.0 in | Wt 112.0 lb

## 2019-04-06 DIAGNOSIS — I739 Peripheral vascular disease, unspecified: Secondary | ICD-10-CM

## 2019-04-06 DIAGNOSIS — Z48812 Encounter for surgical aftercare following surgery on the circulatory system: Secondary | ICD-10-CM

## 2019-04-06 NOTE — Progress Notes (Signed)
Patient name: Trevor [name] MRN: [id number] DOB: 01/09/1946 Sex: male  REASON FOR VISIT:   Follow-up after left above-the-knee amputation.  HPI:   Trevor [name] is a pleasant 74 y.o. male who had presented with a gangrenous left foot.  He underwent a left above-the-knee amputation on 03/01/2019.  He did well postoperatively and comes in for a postoperative follow-up visit and for staple removal.  Current Outpatient Medications  Medication Sig Dispense Refill  . aspirin EC 81 MG tablet Take 1 tablet (81 mg total) by mouth daily. 90 tablet 3  . atorvastatin (LIPITOR) 80 MG tablet Take 1 tablet (80 mg total) by mouth at bedtime. 30 tablet 6  . bisacodyl (DULCOLAX) 10 MG suppository Place 10 mg rectally daily as needed (constipation).     . Cholecalciferol (D3-1000) 25 MCG (1000 UT) tablet Take 1,000 Units by mouth daily.    . cinacalcet (SENSIPAR) 90 MG tablet Take 90 mg by mouth daily with supper.    . clopidogrel (PLAVIX) 75 MG tablet Take 1 tablet (75 mg total) by mouth daily. 30 tablet 2  . docusate sodium (COLACE) 100 MG capsule Take 200 mg by mouth at bedtime.     Marland Kitchen doxercalciferol (HECTOROL) 4 MCG/2ML injection Inject 1 mL (2 mcg total) into the vein every Monday, Wednesday, and Friday with hemodialysis. 2 mL   . gabapentin (NEURONTIN) 300 MG capsule Take 1 capsule (300 mg total) by mouth at bedtime. 30 capsule 0  . Glycopyrrolate (LONHALA MAGNAIR REFILL KIT) 25 MCG/ML SOLN Inhale 25 mcg into the lungs 2 (two) times daily.    Vanessa Kick Ethyl (VASCEPA) 1 g CAPS Take 1 g by mouth 2 (two) times daily.     Marland Kitchen ipratropium-albuterol (DUONEB) 0.5-2.5 (3) MG/3ML SOLN Take 3 mLs by nebulization every 8 (eight) hours as needed (for copd).    . metoprolol tartrate (LOPRESSOR) 25 MG tablet Take 0.5 tablets (12.5 mg total) by mouth 2 (two) times daily. 30 tablet 5  . mirtazapine (REMERON) 7.5 MG tablet Take 7.5 mg by mouth at bedtime.    . nitroGLYCERIN (NITROSTAT) 0.4 MG SL tablet Place 1  tablet (0.4 mg total) under the tongue every 5 (five) minutes x 3 doses as needed for chest pain. 25 tablet 12  . Nutritional Supplements (ENSURE PO) Take 237 mLs by mouth 2 (two) times daily.     . Nutritional Supplements (FEEDING SUPPLEMENT, NEPRO CARB STEADY,) LIQD Take 237 mLs by mouth 2 (two) times daily between meals.  0  . ondansetron (ZOFRAN) 4 MG tablet Take 4 mg by mouth every 6 (six) hours as needed for nausea or vomiting.    Marland Kitchen oxyCODONE (OXY IR/ROXICODONE) 5 MG immediate release tablet Take 1 tablet (5 mg total) by mouth every 6 (six) hours as needed for severe pain. 30 tablet 0  . senna (SENOKOT) 8.6 MG TABS tablet Take 1 tablet by mouth daily.    . sevelamer carbonate (RENVELA) 800 MG tablet Take 1,600 mg by mouth 3 (three) times daily with meals.     . isosorbide mononitrate (IMDUR) 30 MG 24 hr tablet Take 1 tablet (30 mg total) by mouth daily. 90 tablet 3   No current facility-administered medications for this visit.     REVIEW OF SYSTEMS:  _0  denotes positive finding, _1  denotes negative finding Vascular    Leg swelling    Cardiac    Chest pain or chest pressure:    Shortness of breath upon exertion:  Short of breath when lying flat:    Irregular heart rhythm:    Constitutional    Fever or chills:     PHYSICAL EXAM:   Vitals:   04/06/19 0928  BP: (!) 146/68  Pulse: 78  Resp: 18  Temp: (!) 97.3 F (36.3 C)  SpO2: 95%  Weight: 112 lb (50.8 kg)  Height: _0  (1.778 m)    GENERAL: The patient is a well-nourished male, in no acute distress. The vital signs are documented above. CARDIOVASCULAR: There is a regular rate and rhythm. PULMONARY: There is good air exchange bilaterally without wheezing or rales. His left above-the-knee amputation is healing nicely.  DATA:   No new data  MEDICAL ISSUES:   STATUS POST LEFT AKA: The patient is doing well status post left above-the-knee amputation.  His staples were removed today.  I will see him back as needed.   [name] [facility] [phone]

## 2019-04-18 DIAGNOSIS — N186 End stage renal disease: Secondary | ICD-10-CM | POA: Diagnosis not present

## 2019-04-18 DIAGNOSIS — J449 Chronic obstructive pulmonary disease, unspecified: Secondary | ICD-10-CM | POA: Diagnosis not present

## 2019-04-18 DIAGNOSIS — Z992 Dependence on renal dialysis: Secondary | ICD-10-CM | POA: Diagnosis not present

## 2019-04-18 DIAGNOSIS — I679 Cerebrovascular disease, unspecified: Secondary | ICD-10-CM | POA: Diagnosis not present

## 2019-05-06 DIAGNOSIS — N186 End stage renal disease: Secondary | ICD-10-CM | POA: Diagnosis not present

## 2019-05-06 DIAGNOSIS — Z992 Dependence on renal dialysis: Secondary | ICD-10-CM | POA: Diagnosis not present

## 2019-05-06 DIAGNOSIS — I679 Cerebrovascular disease, unspecified: Secondary | ICD-10-CM | POA: Diagnosis not present

## 2019-05-06 DIAGNOSIS — J449 Chronic obstructive pulmonary disease, unspecified: Secondary | ICD-10-CM | POA: Diagnosis not present

## 2019-05-09 ENCOUNTER — Encounter (HOSPITAL_COMMUNITY): Payer: Self-pay | Admitting: Emergency Medicine

## 2019-05-09 ENCOUNTER — Observation Stay (HOSPITAL_COMMUNITY)
Admission: EM | Admit: 2019-05-09 | Discharge: 2019-05-10 | Disposition: A | Discharge status: Skilled Nursing Facility | Payer: Medicare Other | Attending: Internal Medicine | Admitting: Internal Medicine

## 2019-05-09 ENCOUNTER — Other Ambulatory Visit: Payer: Self-pay

## 2019-05-09 ENCOUNTER — Emergency Department (HOSPITAL_COMMUNITY): Payer: Medicare Other

## 2019-05-09 DIAGNOSIS — I132 Hypertensive heart and chronic kidney disease with heart failure and with stage 5 chronic kidney disease, or end stage renal disease: Secondary | ICD-10-CM | POA: Insufficient documentation

## 2019-05-09 DIAGNOSIS — J9601 Acute respiratory failure with hypoxia: Secondary | ICD-10-CM | POA: Diagnosis not present

## 2019-05-09 DIAGNOSIS — N2581 Secondary hyperparathyroidism of renal origin: Secondary | ICD-10-CM | POA: Diagnosis not present

## 2019-05-09 DIAGNOSIS — N400 Enlarged prostate without lower urinary tract symptoms: Secondary | ICD-10-CM | POA: Diagnosis not present

## 2019-05-09 DIAGNOSIS — Z1159 Encounter for screening for other viral diseases: Secondary | ICD-10-CM | POA: Insufficient documentation

## 2019-05-09 DIAGNOSIS — F419 Anxiety disorder, unspecified: Secondary | ICD-10-CM | POA: Diagnosis not present

## 2019-05-09 DIAGNOSIS — E8889 Other specified metabolic disorders: Secondary | ICD-10-CM | POA: Insufficient documentation

## 2019-05-09 DIAGNOSIS — I5043 Acute on chronic combined systolic (congestive) and diastolic (congestive) heart failure: Secondary | ICD-10-CM | POA: Diagnosis not present

## 2019-05-09 DIAGNOSIS — Z9981 Dependence on supplemental oxygen: Secondary | ICD-10-CM | POA: Diagnosis not present

## 2019-05-09 DIAGNOSIS — Z8673 Personal history of transient ischemic attack (TIA), and cerebral infarction without residual deficits: Secondary | ICD-10-CM | POA: Insufficient documentation

## 2019-05-09 DIAGNOSIS — J810 Acute pulmonary edema: Secondary | ICD-10-CM | POA: Diagnosis not present

## 2019-05-09 DIAGNOSIS — F17210 Nicotine dependence, cigarettes, uncomplicated: Secondary | ICD-10-CM | POA: Insufficient documentation

## 2019-05-09 DIAGNOSIS — Z89612 Acquired absence of left leg above knee: Secondary | ICD-10-CM | POA: Insufficient documentation

## 2019-05-09 DIAGNOSIS — I10 Essential (primary) hypertension: Secondary | ICD-10-CM | POA: Diagnosis not present

## 2019-05-09 DIAGNOSIS — R069 Unspecified abnormalities of breathing: Secondary | ICD-10-CM | POA: Diagnosis not present

## 2019-05-09 DIAGNOSIS — R0602 Shortness of breath: Secondary | ICD-10-CM | POA: Diagnosis not present

## 2019-05-09 DIAGNOSIS — J9621 Acute and chronic respiratory failure with hypoxia: Secondary | ICD-10-CM | POA: Diagnosis not present

## 2019-05-09 DIAGNOSIS — R05 Cough: Secondary | ICD-10-CM | POA: Diagnosis not present

## 2019-05-09 DIAGNOSIS — I252 Old myocardial infarction: Secondary | ICD-10-CM | POA: Diagnosis not present

## 2019-05-09 DIAGNOSIS — Z7951 Long term (current) use of inhaled steroids: Secondary | ICD-10-CM | POA: Diagnosis not present

## 2019-05-09 DIAGNOSIS — I2510 Atherosclerotic heart disease of native coronary artery without angina pectoris: Secondary | ICD-10-CM | POA: Diagnosis not present

## 2019-05-09 DIAGNOSIS — Z8249 Family history of ischemic heart disease and other diseases of the circulatory system: Secondary | ICD-10-CM | POA: Insufficient documentation

## 2019-05-09 DIAGNOSIS — I739 Peripheral vascular disease, unspecified: Secondary | ICD-10-CM | POA: Insufficient documentation

## 2019-05-09 DIAGNOSIS — D631 Anemia in chronic kidney disease: Secondary | ICD-10-CM | POA: Diagnosis not present

## 2019-05-09 DIAGNOSIS — Z79899 Other long term (current) drug therapy: Secondary | ICD-10-CM | POA: Diagnosis not present

## 2019-05-09 DIAGNOSIS — I120 Hypertensive chronic kidney disease with stage 5 chronic kidney disease or end stage renal disease: Secondary | ICD-10-CM | POA: Diagnosis not present

## 2019-05-09 DIAGNOSIS — N186 End stage renal disease: Secondary | ICD-10-CM | POA: Diagnosis not present

## 2019-05-09 DIAGNOSIS — I480 Paroxysmal atrial fibrillation: Secondary | ICD-10-CM | POA: Insufficient documentation

## 2019-05-09 DIAGNOSIS — R0600 Dyspnea, unspecified: Secondary | ICD-10-CM | POA: Diagnosis present

## 2019-05-09 DIAGNOSIS — Z7902 Long term (current) use of antithrombotics/antiplatelets: Secondary | ICD-10-CM | POA: Diagnosis not present

## 2019-05-09 DIAGNOSIS — I4891 Unspecified atrial fibrillation: Secondary | ICD-10-CM | POA: Diagnosis present

## 2019-05-09 DIAGNOSIS — Z7982 Long term (current) use of aspirin: Secondary | ICD-10-CM | POA: Insufficient documentation

## 2019-05-09 DIAGNOSIS — E875 Hyperkalemia: Secondary | ICD-10-CM | POA: Diagnosis not present

## 2019-05-09 DIAGNOSIS — Z20828 Contact with and (suspected) exposure to other viral communicable diseases: Secondary | ICD-10-CM | POA: Diagnosis not present

## 2019-05-09 DIAGNOSIS — N189 Chronic kidney disease, unspecified: Secondary | ICD-10-CM | POA: Diagnosis present

## 2019-05-09 DIAGNOSIS — Z992 Dependence on renal dialysis: Secondary | ICD-10-CM | POA: Insufficient documentation

## 2019-05-09 LAB — CBC WITH DIFFERENTIAL/PLATELET
Abs Immature Granulocytes: 0.07 10*3/uL (ref 0.00–0.07)
Basophils Absolute: 0.1 10*3/uL (ref 0.0–0.1)
Basophils Relative: 1 %
Eosinophils Absolute: 0.1 10*3/uL (ref 0.0–0.5)
Eosinophils Relative: 1 %
HCT: 37.6 % — ABNORMAL LOW (ref 39.0–52.0)
Hemoglobin: 11.8 g/dL — ABNORMAL LOW (ref 13.0–17.0)
Immature Granulocytes: 1 %
Lymphocytes Relative: 10 %
Lymphs Abs: 1 10*3/uL (ref 0.7–4.0)
MCH: 27.8 pg (ref 26.0–34.0)
MCHC: 31.4 g/dL (ref 30.0–36.0)
MCV: 88.7 fL (ref 80.0–100.0)
Monocytes Absolute: 0.9 10*3/uL (ref 0.1–1.0)
Monocytes Relative: 9 %
Neutro Abs: 8.2 10*3/uL — ABNORMAL HIGH (ref 1.7–7.7)
Neutrophils Relative %: 78 %
Platelets: 127 10*3/uL — ABNORMAL LOW (ref 150–400)
RBC: 4.24 MIL/uL (ref 4.22–5.81)
RDW: 15.5 % (ref 11.5–15.5)
WBC: 10.3 10*3/uL (ref 4.0–10.5)
nRBC: 0 % (ref 0.0–0.2)

## 2019-05-09 LAB — SARS CORONAVIRUS 2 BY RT PCR (HOSPITAL ORDER, PERFORMED IN ~~LOC~~ HOSPITAL LAB): SARS Coronavirus 2: NEGATIVE

## 2019-05-09 LAB — BASIC METABOLIC PANEL
Anion gap: 15 (ref 5–15)
BUN: 55 mg/dL — ABNORMAL HIGH (ref 8–23)
CO2: 27 mmol/L (ref 22–32)
Calcium: 8.7 mg/dL — ABNORMAL LOW (ref 8.9–10.3)
Chloride: 99 mmol/L (ref 98–111)
Creatinine, Ser: 8.21 mg/dL — ABNORMAL HIGH (ref 0.61–1.24)
GFR calc Af Amer: 7 mL/min — ABNORMAL LOW (ref >60)
GFR calc non Af Amer: 6 mL/min — ABNORMAL LOW (ref >60)
Glucose, Bld: 82 mg/dL (ref 70–99)
Potassium: 5.5 mmol/L — ABNORMAL HIGH (ref 3.5–5.1)
Sodium: 141 mmol/L (ref 135–145)

## 2019-05-09 LAB — CREATININE, SERUM
Creatinine, Ser: 9.41 mg/dL — ABNORMAL HIGH (ref 0.61–1.24)
GFR calc Af Amer: 6 mL/min — ABNORMAL LOW (ref >60)
GFR calc non Af Amer: 5 mL/min — ABNORMAL LOW (ref >60)

## 2019-05-09 LAB — BRAIN NATRIURETIC PEPTIDE: B Natriuretic Peptide: >4500 pg/mL — ABNORMAL HIGH (ref 0.0–100.0)

## 2019-05-09 LAB — MRSA PCR SCREENING: MRSA by PCR: NEGATIVE

## 2019-05-09 MED ORDER — ALBUTEROL SULFATE (2.5 MG/3ML) 0.083% IN NEBU
5.0000 mg | INHALATION_SOLUTION | Freq: Once | RESPIRATORY_TRACT | Status: DC
  Filled 2019-05-09: qty 6

## 2019-05-09 MED ORDER — METOPROLOL TARTRATE 12.5 MG HALF TABLET
12.5000 mg | ORAL_TABLET | Freq: Two times a day (BID) | ORAL | Status: DC
  Administered 2019-05-09 – 2019-05-10 (×2): 12.5 mg via ORAL
  Filled 2019-05-09 (×3): qty 1

## 2019-05-09 MED ORDER — IPRATROPIUM-ALBUTEROL 0.5-2.5 (3) MG/3ML IN SOLN: 3.0000 mL | Freq: Three times a day (TID) | RESPIRATORY_TRACT | Status: DC | PRN

## 2019-05-09 MED ORDER — CLOPIDOGREL BISULFATE 75 MG PO TABS
75.0000 mg | ORAL_TABLET | Freq: Every day | ORAL | Status: DC
  Administered 2019-05-09 – 2019-05-10 (×2): 75 mg via ORAL
  Filled 2019-05-09 (×2): qty 1

## 2019-05-09 MED ORDER — CINACALCET HCL 30 MG PO TABS: 90.0000 mg | ORAL_TABLET | Freq: Every day | ORAL | Status: DC

## 2019-05-09 MED ORDER — MIRTAZAPINE 15 MG PO TABS
7.5000 mg | ORAL_TABLET | Freq: Every day | ORAL | Status: DC
  Administered 2019-05-09: 22:00:00 7.5 mg via ORAL
  Filled 2019-05-09: qty 1

## 2019-05-09 MED ORDER — NITROGLYCERIN 2 % TD OINT
1.0000 [in_us] | TOPICAL_OINTMENT | Freq: Once | TRANSDERMAL | Status: AC
  Administered 2019-05-09: 09:00:00 1 [in_us] via TOPICAL
  Filled 2019-05-09: qty 1

## 2019-05-09 MED ORDER — BISACODYL 10 MG RE SUPP: 10.0000 mg | Freq: Every day | RECTAL | Status: DC | PRN

## 2019-05-09 MED ORDER — DOCUSATE SODIUM 100 MG PO CAPS
200.0000 mg | ORAL_CAPSULE | Freq: Every day | ORAL | Status: DC
  Administered 2019-05-09: 200 mg via ORAL
  Filled 2019-05-09: qty 2

## 2019-05-09 MED ORDER — ACETAMINOPHEN 650 MG RE SUPP: 650.0000 mg | Freq: Four times a day (QID) | RECTAL | Status: DC | PRN

## 2019-05-09 MED ORDER — RENA-VITE PO TABS
1.0000 | ORAL_TABLET | Freq: Every day | ORAL | Status: DC
  Administered 2019-05-09: 22:00:00 1 via ORAL
  Filled 2019-05-09: qty 1

## 2019-05-09 MED ORDER — ONDANSETRON HCL 4 MG PO TABS: 4.0000 mg | ORAL_TABLET | Freq: Four times a day (QID) | ORAL | Status: DC | PRN

## 2019-05-09 MED ORDER — PENTAFLUOROPROP-TETRAFLUOROETH EX AERO: 1.0000 "application " | INHALATION_SPRAY | CUTANEOUS | Status: DC | PRN

## 2019-05-09 MED ORDER — SEVELAMER CARBONATE 800 MG PO TABS
1600.0000 mg | ORAL_TABLET | Freq: Three times a day (TID) | ORAL | Status: DC
  Administered 2019-05-09 – 2019-05-10 (×2): 1600 mg via ORAL
  Filled 2019-05-09 (×2): qty 2

## 2019-05-09 MED ORDER — SENNA 8.6 MG PO TABS
1.0000 | ORAL_TABLET | Freq: Every day | ORAL | Status: DC
  Administered 2019-05-09 – 2019-05-10 (×2): 8.6 mg via ORAL
  Filled 2019-05-09 (×2): qty 1

## 2019-05-09 MED ORDER — ISOSORBIDE MONONITRATE ER 30 MG PO TB24
30.0000 mg | ORAL_TABLET | Freq: Every day | ORAL | Status: DC
  Administered 2019-05-09 – 2019-05-10 (×2): 30 mg via ORAL
  Filled 2019-05-09 (×2): qty 1

## 2019-05-09 MED ORDER — ALTEPLASE 2 MG IJ SOLR: 2.0000 mg | Freq: Once | INTRAMUSCULAR | Status: DC | PRN

## 2019-05-09 MED ORDER — ATORVASTATIN CALCIUM 80 MG PO TABS
80.0000 mg | ORAL_TABLET | Freq: Every day | ORAL | Status: DC
  Administered 2019-05-09: 80 mg via ORAL
  Filled 2019-05-09: qty 1

## 2019-05-09 MED ORDER — ONDANSETRON HCL 4 MG/2ML IJ SOLN: 4.0000 mg | Freq: Four times a day (QID) | INTRAMUSCULAR | Status: DC | PRN

## 2019-05-09 MED ORDER — OXYCODONE HCL 5 MG PO TABS: 5.0000 mg | ORAL_TABLET | Freq: Four times a day (QID) | ORAL | Status: DC | PRN

## 2019-05-09 MED ORDER — HEPARIN SODIUM (PORCINE) 5000 UNIT/ML IJ SOLN
5000.0000 [IU] | Freq: Three times a day (TID) | INTRAMUSCULAR | Status: DC
  Administered 2019-05-09 – 2019-05-10 (×3): 5000 [IU] via SUBCUTANEOUS
  Filled 2019-05-09 (×3): qty 1

## 2019-05-09 MED ORDER — CHLORHEXIDINE GLUCONATE CLOTH 2 % EX PADS
6.0000 | MEDICATED_PAD | Freq: Every day | CUTANEOUS | Status: DC
  Administered 2019-05-09: 6 via TOPICAL

## 2019-05-09 MED ORDER — NITROGLYCERIN 0.4 MG SL SUBL: 0.4000 mg | SUBLINGUAL_TABLET | SUBLINGUAL | Status: DC | PRN

## 2019-05-09 MED ORDER — NEPRO/CARBSTEADY PO LIQD
237.0000 mL | Freq: Two times a day (BID) | ORAL | Status: DC
  Administered 2019-05-09 – 2019-05-10 (×2): 237 mL via ORAL

## 2019-05-09 MED ORDER — LIDOCAINE-PRILOCAINE 2.5-2.5 % EX CREA: 1.0000 "application " | TOPICAL_CREAM | CUTANEOUS | Status: DC | PRN

## 2019-05-09 MED ORDER — GABAPENTIN 300 MG PO CAPS
300.0000 mg | ORAL_CAPSULE | Freq: Every day | ORAL | Status: DC
  Administered 2019-05-09: 300 mg via ORAL
  Filled 2019-05-09: qty 1

## 2019-05-09 MED ORDER — SODIUM CHLORIDE 0.9 % IV SOLN: 100.0000 mL | INTRAVENOUS | Status: DC | PRN

## 2019-05-09 MED ORDER — ACETAMINOPHEN 325 MG PO TABS: 650.0000 mg | ORAL_TABLET | Freq: Four times a day (QID) | ORAL | Status: DC | PRN

## 2019-05-09 MED ORDER — LIDOCAINE HCL (PF) 1 % IJ SOLN: 5.0000 mL | INTRAMUSCULAR | Status: DC | PRN

## 2019-05-09 MED ORDER — ASPIRIN EC 81 MG PO TBEC
81.0000 mg | DELAYED_RELEASE_TABLET | Freq: Every day | ORAL | Status: DC
  Administered 2019-05-09 – 2019-05-10 (×2): 81 mg via ORAL
  Filled 2019-05-09 (×2): qty 1

## 2019-05-09 MED ORDER — DOXERCALCIFEROL 4 MCG/2ML IV SOLN
1.0000 ug | INTRAVENOUS | Status: DC
  Filled 2019-05-09: qty 2

## 2019-05-09 NOTE — ED Notes (Signed)
IV attempt unsuccessful x 2

## 2019-05-09 NOTE — ED Notes (Signed)
Patient lowered to 4 L- 98%

## 2019-05-09 NOTE — Consult Note (Addendum)
[facility] Renal Consultation Note    Indication for Consultation:  Management of ESRD/hemodialysis; anemia, hypertension/volume and secondary hyperparathyroidism  HPI: Trevor [name] is a 73 y.o. male with ESRD on MWF HD at [facility] with complex PMHx including CVA, CAD, PAF,  HTN, PVD s/p LE bypass grafts, left AKA, tobacco abuse, who presented to the [facility] ED with SOB and chest tightness.  O2 was increased tl 4 L, COVID test was netative CXR showed increased diffuse interstitial and airspace disease likely pulmonary edema. K 5.5 He is generally compliant with all dialysis treatments.  His last HD was 6/6.  EDW had been raised recently to 51 from 50.5. Average net UF runs 1 -2 L potassium 5.5 hgb 11.8.  He was transferred to [facility] for inpatient HD and is due for dialysis today.  Currently resides at [facility] on [address] [place].  Staff buys his cigarettes and he is allowed to go outside and smoke.  Offers that maybe he should stop.  At present no SOB. No OC use at baseline he states.  Currently no SOB, chest tightness. No N, V, D, pain, fever or chills. + cough but that is not new.  Past Medical History:  Diagnosis Date  . AAA (abdominal aortic aneurysm) ([place])   . Anemia   . Anxiety   . Arthritis    arms and back  . Blindness   . CHF (congestive heart failure) ([place])   . Coronary artery disease    mild   . 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 ESRD on hemodialysis ([place])    started HD 2014-15  . Headache(784.0)   . HTN (hypertension)   . Protein-calorie malnutrition ([place])   . STEMI (ST elevation myocardial infarction) ([place]) 11/11/2018  . Stroke [facility]) 01/2018   no residual deficits   Past Surgical History:  Procedure Laterality Date  . A/V FISTULAGRAM N/A 07/29/2018   Procedure: A/V FISTULAGRAM - left upper extremity;  Surgeon: Waynetta Sandy, MD;  Location: [place] CV LAB;  Service: Cardiovascular;   Laterality: N/A;  . AMPUTATION Left 03/01/2019   Procedure: AMPUTATION ABOVE KNEE;  Surgeon: Angelia Mould, MD;  Location: [place];  Service: Vascular;  Laterality: Left;  . AXILLARY-FEMORAL BYPASS GRAFT  06/28/2017   Procedure: BYPASS GRAFT RIGHT AXILLA-BIFEMORAL USING 8MM X 30CM AND 8MM X 60CM HEMASHIELD GOLD GRAFTS;  Surgeon: Rosetta Posner, MD;  Location: [place];  Service: Vascular;;  . CARDIAC CATHETERIZATION N/A 11/01/2016   Procedure: Left Heart Cath and Coronary Angiography;  Surgeon: Lorretta Harp, MD;  Location: [place] CV LAB;  Service: Cardiovascular;  Laterality: N/A;  . ENDARTERECTOMY FEMORAL Bilateral 06/28/2017   Procedure: ENDARTERECTOMY BILATERAL FEMORAL ARTERIES;  Surgeon: Rosetta Posner, MD;  Location: [place];  Service: Vascular;  Laterality: Bilateral;  . FISTULA SUPERFICIALIZATION Left 3/55/7322   Procedure: PLICATION OF ARTERIOVENOUS FISTULA LEFT ARM;  Surgeon: Waynetta Sandy, MD;  Location: [place];  Service: Vascular;  Laterality: Left;  . FISTULOGRAM Left 05/20/2018   Procedure: FISTULOGRAM WITH BALLOON ANGIOPLASTY LEFT ARM ARTERIOVENOUS FISTULA;  Surgeon: Waynetta Sandy, MD;  Location: [place];  Service: Vascular;  Laterality: Left;  . INSERTION OF DIALYSIS CATHETER  09/21/2012   Procedure: INSERTION OF DIALYSIS CATHETER;  Surgeon: Angelia Mould, MD;  Location: [place];  Service: Vascular;  Laterality: N/A;  Right Internal Jugular Placement  . LEFT HEART CATH N/A 11/11/2018  Procedure: LEFT HEART CATH;  Surgeon: Sherren Mocha, MD;  Location: [place] CV LAB;  Service: Cardiovascular;  Laterality: N/A;  . LEFT HEART CATHETERIZATION WITH CORONARY ANGIOGRAM N/A 09/28/2012   Procedure: LEFT HEART CATHETERIZATION WITH CORONARY ANGIOGRAM;  Surgeon: Sherren Mocha, MD;  Location: [facility] CATH LAB;  Service: Cardiovascular;  Laterality: N/A;  . LEFT HEART CATHETERIZATION WITH CORONARY ANGIOGRAM N/A 07/07/2014   Procedure: LEFT HEART CATHETERIZATION  WITH CORONARY ANGIOGRAM;  Surgeon: Leonie Man, MD;  Location: [facility] CATH LAB;  Service: Cardiovascular;  Laterality: N/A;  . PERIPHERAL VASCULAR BALLOON ANGIOPLASTY Left 07/29/2018   Procedure: PERIPHERAL VASCULAR BALLOON ANGIOPLASTY;  Surgeon: Waynetta Sandy, MD;  Location: [place] CV LAB;  Service: Cardiovascular;  Laterality: Left;  CENTRAL VEIN  . PERIPHERAL VASCULAR INTERVENTION Left 07/29/2018   Procedure: PERIPHERAL VASCULAR INTERVENTION;  Surgeon: Waynetta Sandy, MD;  Location: [place] CV LAB;  Service: Cardiovascular;  Laterality: Left;  AXILLARY VEIN  . REVISION OF ARTERIOVENOUS GORETEX GRAFT Left 12/30/2018   Procedure: REVISION OF ARTERIOVENOUS FISTULA LEFT ARM;  Surgeon: Rosetta Posner, MD;  Location: [place];  Service: Vascular;  Laterality: Left;  . REVISON OF ARTERIOVENOUS FISTULA Left 08/17/2018   Procedure: REVISION OF ARTERIOVENOUS FISTULA LEFT ARM;  Surgeon: Waynetta Sandy, MD;  Location: [place];  Service: Vascular;  Laterality: Left;  . right hand    . TEE WITHOUT CARDIOVERSION  10/09/2011   Procedure: TRANSESOPHAGEAL ECHOCARDIOGRAM (TEE);  Surgeon: Lelon Perla, MD;  Location: [facility] ENDOSCOPY;  Service: Cardiovascular;  Laterality: N/A;   Family History  Problem Relation Age of Onset  . Heart attack Mother        MI in her 78s  . Diabetes Mother   . Alcohol abuse Father   . Anesthesia problems Neg Hx   . Hypotension Neg Hx   . Malignant hyperthermia Neg Hx   . Pseudochol deficiency Neg Hx    Social History:  reports that he has been smoking cigarettes. He has a 30.00 pack-year smoking history. He has never used smokeless tobacco. He reports that he does not drink alcohol or use drugs. No Known Allergies Prior to Admission medications   Medication Sig Start Date End Date Taking? Authorizing Provider  aspirin EC 81 MG tablet Take 1 tablet (81 mg total) by mouth daily. 11/15/18  Yes Bhagat, Bhavinkumar, PA  atorvastatin (LIPITOR) 80  MG tablet Take 1 tablet (80 mg total) by mouth at bedtime. 11/15/18  Yes Bhagat, Bhavinkumar, PA  b complex-vitamin c-folic acid (NEPHRO-VITE) 0.8 MG TABS tablet Take 1 tablet by mouth daily.   Yes [provider]  bisacodyl (DULCOLAX) 10 MG suppository Place 10 mg rectally daily as needed (constipation).    Yes [provider]  cinacalcet (SENSIPAR) 90 MG tablet Take 90 mg by mouth daily with supper.   Yes [provider]  clopidogrel (PLAVIX) 75 MG tablet Take 1 tablet (75 mg total) by mouth daily. 04/05/15  Yes Barton Dubois, MD  docusate sodium (COLACE) 100 MG capsule Take 200 mg by mouth at bedtime.    Yes [provider]  gabapentin (NEURONTIN) 300 MG capsule Take 1 capsule (300 mg total) by mouth at bedtime. 01/14/18  Yes Colbert Ewing, MD  Glycopyrrolate 15.6 MCG CAPS Place 15.6 mg into inhaler and inhale 2 (two) times a day.   Yes [provider]  Icosapent Ethyl (VASCEPA) 1 g CAPS Take 1 g by mouth 2 (two) times daily.    Yes [provider]  ipratropium-albuterol (DUONEB) 0.5-2.5 (3) MG/3ML SOLN Take 3 mLs by nebulization every 8 (eight) hours as needed (for copd).   Yes [provider]  isosorbide mononitrate (IMDUR) 30 MG 24 hr tablet Take 1 tablet (30 mg total) by mouth daily. 11/22/18 05/09/19 Yes Kilroy, Luke K, PA-C  metoprolol tartrate (LOPRESSOR) 25 MG tablet Take 0.5 tablets (12.5 mg total) by mouth 2 (two) times daily. 11/15/18  Yes Bhagat, Bhavinkumar, PA  mirtazapine (REMERON) 7.5 MG tablet Take 7.5 mg by mouth at bedtime.   Yes [provider]  nitroGLYCERIN (NITROSTAT) 0.4 MG SL tablet Place 1 tablet (0.4 mg total) under the tongue every 5 (five) minutes x 3 doses as needed for chest pain. 11/15/18  Yes Bhagat, Bhavinkumar, PA  Nutritional Supplements (ENSURE PO) Take 237 mLs by mouth 2 (two) times daily.    Yes [provider]  Nutritional Supplements (FEEDING SUPPLEMENT, NEPRO CARB STEADY,) LIQD  Take 237 mLs by mouth 2 (two) times daily between meals. 12/11/16  Yes Geradine Girt, DO  Nutritional Supplements (PROMOD) LIQD Take 30 mLs by mouth 2 (two) times a day.   Yes [provider]  ondansetron (ZOFRAN) 4 MG tablet Take 4 mg by mouth every 6 (six) hours as needed for nausea or vomiting.   Yes [provider]  oxyCODONE (OXY IR/ROXICODONE) 5 MG immediate release tablet Take 1 tablet (5 mg total) by mouth every 6 (six) hours as needed for severe pain. 03/06/19  Yes [name], MD  senna (SENOKOT) 8.6 MG TABS tablet Take 1 tablet by mouth daily.   Yes [provider]  sevelamer carbonate (RENVELA) 800 MG tablet Take 1,600 mg by mouth 3 (three) times daily with meals.  12/13/18  Yes [provider]  doxercalciferol (HECTOROL) 4 MCG/2ML injection Inject 1 mL (2 mcg total) into the vein every Monday, Wednesday, and Friday with hemodialysis. Patient not taking: Reported on 05/09/2019 01/15/18   Colbert Ewing, MD   Current Facility-Administered Medications  Medication Dose Route Frequency Provider Last Rate Last Dose  . acetaminophen (TYLENOL) tablet 650 mg  650 mg Oral Q6H PRN Rai, Ripudeep K, MD       Or  . acetaminophen (TYLENOL) suppository 650 mg  650 mg Rectal Q6H PRN Rai, Ripudeep K, MD      . aspirin EC tablet 81 mg  81 mg Oral Daily Rai, Ripudeep K, MD      . atorvastatin (LIPITOR) tablet 80 mg  80 mg Oral QHS Rai, Ripudeep K, MD      . bisacodyl (DULCOLAX) suppository 10 mg  10 mg Rectal Daily PRN Rai, Ripudeep K, MD      . Chlorhexidine Gluconate Cloth 2 % PADS 6 each  6 each Topical Q0600 Alric Seton, PA-C   6 each at 05/09/19 1050  . cinacalcet (SENSIPAR) tablet 90 mg  90 mg Oral Q supper Rai, Ripudeep K, MD      . clopidogrel (PLAVIX) tablet 75 mg  75 mg Oral Daily Rai, Ripudeep K, MD      . docusate sodium (COLACE) capsule 200 mg  200 mg Oral QHS Rai, Ripudeep K, MD      . doxercalciferol (HECTOROL) injection 1 mcg  1 mcg  Intravenous Q M,W,F-HD Alric Seton, PA-C      . feeding supplement (NEPRO CARB STEADY) liquid 237 mL  237 mL Oral BID BM Rai, Ripudeep K, MD      . gabapentin (NEURONTIN) capsule 300 mg  300 mg Oral  QHS Rai, Ripudeep K, MD      . heparin injection 5,000 Units  5,000 Units Subcutaneous Q8H Rai, Ripudeep K, MD      . ipratropium-albuterol (DUONEB) 0.5-2.5 (3) MG/3ML nebulizer solution 3 mL  3 mL Nebulization Q8H PRN Rai, Ripudeep K, MD      . isosorbide mononitrate (IMDUR) 24 hr tablet 30 mg  30 mg Oral Daily Rai, Ripudeep K, MD      . metoprolol tartrate (LOPRESSOR) tablet 12.5 mg  12.5 mg Oral BID Rai, Ripudeep K, MD      . mirtazapine (REMERON) tablet 7.5 mg  7.5 mg Oral QHS Rai, Ripudeep K, MD      . multivitamin (RENA-VIT) tablet 1 tablet  1 tablet Oral QHS Rai, Ripudeep K, MD      . nitroGLYCERIN (NITROSTAT) SL tablet 0.4 mg  0.4 mg Sublingual Q5 Min x 3 PRN Rai, Ripudeep K, MD      . ondansetron (ZOFRAN) tablet 4 mg  4 mg Oral Q6H PRN Rai, Ripudeep K, MD       Or  . ondansetron (ZOFRAN) injection 4 mg  4 mg Intravenous Q6H PRN Rai, Ripudeep K, MD      . oxyCODONE (Oxy IR/ROXICODONE) immediate release tablet 5 mg  5 mg Oral Q6H PRN Rai, Ripudeep K, MD      . senna (SENOKOT) tablet 8.6 mg  1 tablet Oral Daily Rai, Ripudeep K, MD      . sevelamer carbonate (RENVELA) tablet 1,600 mg  1,600 mg Oral TID WC Rai, Ripudeep K, MD       Labs: Basic Metabolic Panel: Recent Labs  Lab 05/09/19 0445  NA 141  K 5.5*  CL 99  CO2 27  GLUCOSE 82  BUN 55*  CREATININE 8.21*  CALCIUM 8.7*  CBC: Recent Labs  Lab 05/09/19 0615  WBC 10.3  NEUTROABS 8.2*  HGB 11.8*  HCT 37.6*  MCV 88.7  PLT 127*   Cardiac Enzymes: No results for input(s): CKTOTAL, CKMB, CKMBINDEX, TROPONINI in the last 168 hours. CBG: No results for input(s): GLUCAP in the last 168 hours. IStudies/Results: Dg Chest Port 1 View  Result Date: 05/09/2019 CLINICAL DATA:  Shortness of breath.  Cough. EXAM: PORTABLE CHEST 1  VIEW COMPARISON:  One-view chest x-ray 03/01/2019 FINDINGS: Heart size is within normal limits. Atherosclerotic changes are noted in the aorta. Bilateral interstitial and airspace opacities, most prominent at the base have increased. Small effusions are suspected. Chronic changes of COPD are again noted. Degenerative changes are present within the thoracic spine. IMPRESSION: 1. Increasing diffuse interstitial and airspace disease with lower lobe predominance. The patient's novel coronavirus test was negative. This likely represents edema superimposed on chronic disease. Infection is not excluded. 2. Aortic atherosclerosis. Electronically Signed   By: San Morelle M.D.   On: 05/09/2019 06:31    ROS: As per HPI otherwise negative.  Physical Exam: Vitals:   05/09/19 0925 05/09/19 0930 05/09/19 1000 05/09/19 1127  BP: (!) 153/78 (!) 185/88 (!) 169/114 (!) 165/83  Pulse: 88 91 94 93  Resp: 17 17 (!) 22   Temp:    98.1 F (36.7 C)  TempSrc:    Oral  SpO2: 100% 99% 93% 100%     General: thin elderly gentleman breathing easily wearing O2 Head: NCAT sclera not icteric MMM Neck: Supple.  Lungs: CTA bilaterally without wheezes, rales, or rhonchi. Dim overall Heart: RRR with S1 S2.  Abdomen: soft NT + BS M-S:  Marked muscle wasting  Lower extremities: left AKA no edema right LE no edema Neuro: A & O  X 3. Moves all extremities spontaneously. Psych:  Responds to questions appropriately with a normal affect. Dialysis Access: AVG left upper + bruit  Dialysis Orders: [facility] MWF 4 hr EDW 51 2 K 2 Ca left upper AVF 400/A 1.5 no heparin no ESA or Fe Hectorol 1  Assessment/Plan: 1. SOB with pulmonary edema - volume removal today - reassess EDW NAD- 2. ESRD -  MWF - HD today - reduced time to 3.5 hr due to high patient volume today 3. Hypertension/volume  - BP much higher than usual - comes down with HD start with net UF 2 -2.5 L today 4. Anemia  - hgb 11.8 - no ESA - or Fe 5. Metabolic bone  disease -  Continue weekly Hectorol sensipar/renvela 6. Nutrition - renal diet/vits/ 7. Tobacco abuse - discussed cessation  Myriam Jacobson, PA-C [facility] Beeper [phone] 05/09/2019, 11:45 AM   Pt seen, examined and agree w A/P as above.  Kelly Splinter  MD 05/09/2019, 5:28 PM

## 2019-05-09 NOTE — ED Triage Notes (Signed)
Patient is complaining of SOB for about 2 hours. Home O2 is usually 2 liters. When is on 12L he is at 99%. Patient has had a cough for 3 days.

## 2019-05-09 NOTE — ED Provider Notes (Signed)
[facility] DEPT Provider Note   CSN: [id number] Arrival date & time: 05/09/19  0400    History   Chief Complaint Chief Complaint  Patient presents with  . Shortness of Breath    HPI Trevor [name] is a 73 y.o. male.     The history is provided by the patient.  Shortness of Breath  Severity:  Moderate Onset quality:  Gradual Timing:  Constant Progression:  Improving Chronicity:  New Relieved by:  Oxygen Worsened by:  Nothing Associated symptoms: cough   Associated symptoms: no chest pain, no fever and no hemoptysis    PT With history of ESRD on dialysis, CHF, CAD, stroke presents with shortness of breath.  He reports several hours ago he felt short of breath, and he asked for oxygen.  About 1/2-hour later he was given oxygen with some improvement.  No fevers.  He reports cough for 2 weeks.  No chest pain.  No hemoptysis.  He gets dialysis M/W/F.  Past Medical History:  Diagnosis Date  . AAA (abdominal aortic aneurysm) ([place])   . Anemia   . Anxiety   . Arthritis    arms and back  . Blindness   . CHF (congestive heart failure) ([place])   . Coronary artery disease    mild   . 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 ESRD on hemodialysis ([place])    started HD 2014-15  . Headache(784.0)   . HTN (hypertension)   . Protein-calorie malnutrition ([place])   . STEMI (ST elevation myocardial infarction) ([place]) 11/11/2018  . Stroke [facility]) 01/2018   no residual deficits    Patient Active Problem List   Diagnosis Date Noted  . Anemia associated with chronic renal failure   . Abnormal CXR 02/28/2019  . Marijuana abuse 02/28/2019  . Congestive heart failure (CHF) ([place]) 02/28/2019  . Cellulitis 02/28/2019  . Sepsis ([place]) 02/28/2019  . History of ST elevation myocardial infarction (STEMI) 11/11/2018  . Pulmonary edema 09/20/2018  . Acute respiratory failure with hypoxia ([place]) 03/29/2018  . Sleep stage  dysfunction 11/10/2017  . 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 Vascular device, implant, or graft complication 29/51/8841  . Peripheral arterial disease ([place]) 10/20/2017  . Non-allergic rhinitis 10/20/2017  . 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 BPH (benign prostatic hyperplasia) 07/07/2017  . At risk for adverse drug reaction 07/02/2017  . Acute pulmonary edema ([place]) 06/27/2017  . Hypertensive urgency 01/24/2017  . 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 Ischemic chest pain ([place]) 11/01/2016  . Essential hypertension 03/08/2016  . Dyslipidemia 01/12/2016  . Chest pain 08/12/2015  . Accelerated hypertension 08/12/2015  . Carotid stenosis   . Abdominal aortic aneurysm ([place]) 03/06/2015  . Protein-calorie malnutrition, severe ([place]) 05/20/2014  . End-stage renal disease on hemodialysis ([place]) 05/19/2014  . Anemia of renal disease 05/19/2014  . Coronary artery disease, non-occlusive:  09/28/2012  . Secondary hyperparathyroidism ([place]) 09/27/2012  . Non compliance with medical treatment 09/27/2012  . Acute on chronic combined systolic and diastolic CHF (congestive heart failure) ([place]) 10/10/2011  . A-fib ([place]) 10/08/2011  . History of CVA (cerebrovascular accident) 10/07/2011  . Cocaine abuse ([place]) 10/07/2011  . Tobacco abuse 08/07/2011  . Bruit 08/07/2011    Past Surgical History:  Procedure Laterality Date  . A/V FISTULAGRAM N/A 07/29/2018   Procedure: A/V FISTULAGRAM - left upper extremity;  Surgeon: Waynetta Sandy, MD;  Location: [place] [facility] CV LAB;  Service: Cardiovascular;  Laterality: N/A;  . AMPUTATION Left 03/01/2019   Procedure: AMPUTATION ABOVE KNEE;  Surgeon: Angelia Mould, MD;  Location: [facility];  Service: Vascular;  Laterality: Left;  . AXILLARY-FEMORAL BYPASS GRAFT  06/28/2017   Procedure: BYPASS GRAFT RIGHT AXILLA-BIFEMORAL USING 8MM X 30CM AND 8MM X 60CM HEMASHIELD GOLD GRAFTS;  Surgeon: Rosetta Posner, MD;  Location: [facility];  Service: Vascular;;  .  CARDIAC CATHETERIZATION N/A 11/01/2016   Procedure: Left Heart Cath and Coronary Angiography;  Surgeon: Lorretta Harp, MD;  Location: [place] CV LAB;  Service: Cardiovascular;  Laterality: N/A;  . ENDARTERECTOMY FEMORAL Bilateral 06/28/2017   Procedure: ENDARTERECTOMY BILATERAL FEMORAL ARTERIES;  Surgeon: Rosetta Posner, MD;  Location: [place] [facility];  Service: Vascular;  Laterality: Bilateral;  . FISTULA SUPERFICIALIZATION Left 1/61/0960   Procedure: PLICATION OF ARTERIOVENOUS FISTULA LEFT ARM;  Surgeon: Waynetta Sandy, MD;  Location: [place];  Service: Vascular;  Laterality: Left;  . FISTULOGRAM Left 05/20/2018   Procedure: FISTULOGRAM WITH BALLOON ANGIOPLASTY LEFT ARM ARTERIOVENOUS FISTULA;  Surgeon: Waynetta Sandy, MD;  Location: [place];  Service: Vascular;  Laterality: Left;  . INSERTION OF DIALYSIS CATHETER  09/21/2012   Procedure: INSERTION OF DIALYSIS CATHETER;  Surgeon: Angelia Mould, MD;  Location: [facility];  Service: Vascular;  Laterality: N/A;  Right Internal Jugular Placement  . LEFT HEART CATH N/A 11/11/2018   Procedure: LEFT HEART CATH;  Surgeon: Sherren Mocha, MD;  Location: [place] CV LAB;  Service: Cardiovascular;  Laterality: N/A;  . LEFT HEART CATHETERIZATION WITH CORONARY ANGIOGRAM N/A 09/28/2012   Procedure: LEFT HEART CATHETERIZATION WITH CORONARY ANGIOGRAM;  Surgeon: Sherren Mocha, MD;  Location: [facility] CATH LAB;  Service: Cardiovascular;  Laterality: N/A;  . LEFT HEART CATHETERIZATION WITH CORONARY ANGIOGRAM N/A 07/07/2014   Procedure: LEFT HEART CATHETERIZATION WITH CORONARY ANGIOGRAM;  Surgeon: Leonie Man, MD;  Location: [facility] CATH LAB;  Service: Cardiovascular;  Laterality: N/A;  . PERIPHERAL VASCULAR BALLOON ANGIOPLASTY Left 07/29/2018   Procedure: PERIPHERAL VASCULAR BALLOON ANGIOPLASTY;  Surgeon: Waynetta Sandy, MD;  Location: [place] CV LAB;  Service: Cardiovascular;  Laterality: Left;  CENTRAL VEIN  . PERIPHERAL VASCULAR  INTERVENTION Left 07/29/2018   Procedure: PERIPHERAL VASCULAR INTERVENTION;  Surgeon: Waynetta Sandy, MD;  Location: [place] CV LAB;  Service: Cardiovascular;  Laterality: Left;  AXILLARY VEIN  . REVISION OF ARTERIOVENOUS GORETEX GRAFT Left 12/30/2018   Procedure: REVISION OF ARTERIOVENOUS FISTULA LEFT ARM;  Surgeon: Rosetta Posner, MD;  Location: [place];  Service: Vascular;  Laterality: Left;  . REVISON OF ARTERIOVENOUS FISTULA Left 08/17/2018   Procedure: REVISION OF ARTERIOVENOUS FISTULA LEFT ARM;  Surgeon: Waynetta Sandy, MD;  Location: [place];  Service: Vascular;  Laterality: Left;  . right hand    . TEE WITHOUT CARDIOVERSION  10/09/2011   Procedure: TRANSESOPHAGEAL ECHOCARDIOGRAM (TEE);  Surgeon: Lelon Perla, MD;  Location: [facility] ENDOSCOPY;  Service: Cardiovascular;  Laterality: N/A;        Home Medications    Prior to Admission medications   Medication Sig Start Date End Date Taking? Authorizing Provider  aspirin EC 81 MG tablet Take 1 tablet (81 mg total) by mouth daily. 11/15/18  Yes Bhagat, Bhavinkumar, PA  atorvastatin (LIPITOR) 80 MG tablet Take 1 tablet (80 mg total) by mouth at bedtime. 11/15/18  Yes Bhagat, Bhavinkumar, PA  b complex-vitamin c-folic acid (NEPHRO-VITE) 0.8 MG TABS tablet Take 1 tablet by mouth daily.   Yes [provider]  bisacodyl (DULCOLAX) 10 MG  suppository Place 10 mg rectally daily as needed (constipation).    Yes [provider]  cinacalcet (SENSIPAR) 90 MG tablet Take 90 mg by mouth daily with supper.   Yes [provider]  clopidogrel (PLAVIX) 75 MG tablet Take 1 tablet (75 mg total) by mouth daily. 04/05/15  Yes Barton Dubois, MD  docusate sodium (COLACE) 100 MG capsule Take 200 mg by mouth at bedtime.    Yes [provider]  gabapentin (NEURONTIN) 300 MG capsule Take 1 capsule (300 mg total) by mouth at bedtime. 01/14/18  Yes Colbert Ewing, MD  Glycopyrrolate 15.6 MCG CAPS Place 15.6 mg into  inhaler and inhale 2 (two) times a day.   Yes [provider]  Icosapent Ethyl (VASCEPA) 1 g CAPS Take 1 g by mouth 2 (two) times daily.    Yes [provider]  ipratropium-albuterol (DUONEB) 0.5-2.5 (3) MG/3ML SOLN Take 3 mLs by nebulization every 8 (eight) hours as needed (for copd).   Yes [provider]  isosorbide mononitrate (IMDUR) 30 MG 24 hr tablet Take 1 tablet (30 mg total) by mouth daily. 11/22/18 05/09/19 Yes Kilroy, Luke K, PA-C  metoprolol tartrate (LOPRESSOR) 25 MG tablet Take 0.5 tablets (12.5 mg total) by mouth 2 (two) times daily. 11/15/18  Yes Bhagat, Bhavinkumar, PA  mirtazapine (REMERON) 7.5 MG tablet Take 7.5 mg by mouth at bedtime.   Yes [provider]  nitroGLYCERIN (NITROSTAT) 0.4 MG SL tablet Place 1 tablet (0.4 mg total) under the tongue every 5 (five) minutes x 3 doses as needed for chest pain. 11/15/18  Yes Bhagat, Bhavinkumar, PA  Nutritional Supplements (ENSURE PO) Take 237 mLs by mouth 2 (two) times daily.    Yes [provider]  Nutritional Supplements (FEEDING SUPPLEMENT, NEPRO CARB STEADY,) LIQD Take 237 mLs by mouth 2 (two) times daily between meals. 12/11/16  Yes Geradine Girt, DO  Nutritional Supplements (PROMOD) LIQD Take 30 mLs by mouth 2 (two) times a day.   Yes [provider]  ondansetron (ZOFRAN) 4 MG tablet Take 4 mg by mouth every 6 (six) hours as needed for nausea or vomiting.   Yes [provider]  oxyCODONE (OXY IR/ROXICODONE) 5 MG immediate release tablet Take 1 tablet (5 mg total) by mouth every 6 (six) hours as needed for severe pain. 03/06/19  Yes [name], MD  senna (SENOKOT) 8.6 MG TABS tablet Take 1 tablet by mouth daily.   Yes [provider]  sevelamer carbonate (RENVELA) 800 MG tablet Take 1,600 mg by mouth 3 (three) times daily with meals.  12/13/18  Yes [provider]  doxercalciferol (HECTOROL) 4 MCG/2ML injection Inject 1 mL (2 mcg total) into the  vein every Monday, Wednesday, and Friday with hemodialysis. Patient not taking: Reported on 05/09/2019 01/15/18   Colbert Ewing, MD    Family History Family History  Problem Relation Age of Onset  . Heart attack Mother        MI in her 54s  . Diabetes Mother   . Alcohol abuse Father   . Anesthesia problems Neg Hx   . Hypotension Neg Hx   . Malignant hyperthermia Neg Hx   . Pseudochol deficiency Neg Hx     Social History Social History   Tobacco Use  . Smoking status: Current Every Day Smoker    Packs/day: 0.50    Years: 60.00    Pack years: 30.00    Types: Cigarettes  . Smokeless tobacco: Never Used  . Tobacco  comment: 5-6 cigarettes per day  Substance Use Topics  . Alcohol use: No    Alcohol/week: 0.0 standard drinks    Comment: Occasional  . Drug use: No    Frequency: 2.0 times per week    Types: "Crack" cocaine     Allergies   Patient has no known allergies.   Review of Systems Review of Systems  Constitutional: Negative for fever.  Respiratory: Positive for cough and shortness of breath. Negative for hemoptysis.   Cardiovascular: Negative for chest pain.  All other systems reviewed and are negative.    Physical Exam Updated Vital Signs BP 131/69   Pulse 94   Temp (!) 97.4 F (36.3 C) (Oral)   Resp 18   SpO2 98%   Physical Exam CONSTITUTIONAL: Chronically ill-appearing HEAD: Normocephalic/atraumatic EYES: EOMI ENMT: Mucous membranes moist NECK: supple no meningeal signs SPINE/BACK:entire spine nontender CV: S1/S2 noted LUNGS: Crackles bilaterally, mild tachypnea ABDOMEN: soft, nontender, no rebound or guarding, bowel sounds noted throughout abdomen GU:no cva tenderness NEURO: Pt is awake/alert/appropriate, moves all extremitiesx4.  No facial droop.   EXTREMITIES: pulses normal/equal, full ROM, left AKA noted Dialysis access to left UE, thrill noted SKIN: warm, color normal PSYCH: no abnormalities of mood noted, alert and oriented to  situation   ED Treatments / Results  Labs (all labs ordered are listed, but only abnormal results are displayed) Labs Reviewed  BASIC METABOLIC PANEL - Abnormal; Notable for the following components:      Result Value   Potassium 5.5 (*)    BUN 55 (*)    Creatinine, Ser 8.21 (*)    Calcium 8.7 (*)    GFR calc non Af Amer 6 (*)    GFR calc Af Amer 7 (*)    All other components within normal limits  CBC WITH DIFFERENTIAL/PLATELET - Abnormal; Notable for the following components:   Hemoglobin 11.8 (*)    HCT 37.6 (*)    Platelets 127 (*)    Neutro Abs 8.2 (*)    All other components within normal limits  SARS CORONAVIRUS 2 (HOSPITAL ORDER, [facility])  CBC WITH DIFFERENTIAL/PLATELET  BRAIN NATRIURETIC PEPTIDE    EKG EKG Interpretation  Date/Time:  Monday May 09 2019 04:24:34 EDT Ventricular Rate:  97 PR Interval:    QRS Duration: 95 QT Interval:  387 QTC Calculation: 492 R Axis:   38 Text Interpretation:  Sinus rhythm Probable left atrial enlargement LVH with secondary repolarization abnormality Borderline prolonged QT interval No significant change since last tracing Confirmed by Ripley Fraise ([id number]) on 05/09/2019 4:39:32 AM   Radiology Dg Chest Port 1 View  Result Date: 05/09/2019 CLINICAL DATA:  Shortness of breath.  Cough. EXAM: PORTABLE CHEST 1 VIEW COMPARISON:  One-view chest x-ray 03/01/2019 FINDINGS: Heart size is within normal limits. Atherosclerotic changes are noted in the aorta. Bilateral interstitial and airspace opacities, most prominent at the base have increased. Small effusions are suspected. Chronic changes of COPD are again noted. Degenerative changes are present within the thoracic spine. IMPRESSION: 1. Increasing diffuse interstitial and airspace disease with lower lobe predominance. The patient's novel coronavirus test was negative. This likely represents edema superimposed on chronic disease. Infection is not excluded. 2.  Aortic atherosclerosis. Electronically Signed   By: San Morelle M.D.   On: 05/09/2019 06:31    Procedures Procedures    CRITICAL CARE Performed by: Sharyon Cable Total critical care time: 31 minutes Critical care time was exclusive of separately billable procedures  and treating other patients. Critical care was necessary to treat or prevent imminent or life-threatening deterioration. Critical care was time spent personally by me on the following activities: development of treatment plan with patient and/or surrogate as well as nursing, discussions with consultants, evaluation of patient's response to treatment, examination of patient, obtaining history from patient or surrogate, ordering and performing treatments and interventions, ordering and review of laboratory studies, ordering and review of radiographic studies, pulse oximetry and re-evaluation of patient's condition.  Medications Ordered in ED Medications  nitroGLYCERIN (NITROGLYN) 2 % ointment 1 inch (has no administration in time range)     Initial Impression / Assessment and Plan / ED Course  I have reviewed the triage vital signs and the nursing notes.  Pertinent labs & imaging results that were available during my care of the patient were reviewed by me and considered in my medical decision making (see chart for details).        6:02 AM Patient presents from nursing facility for shortness of breath.  He reports improvement with oxygen.  He has crackles on exam.  X-ray and labs are pending this time 7:13 AM Patient found to have pulmonary edema as well as mild hyperkalemia.  Patient will require dialysis.  NTG paste has been ordered for BP control No EKG changes  Discussed with on-call nephrology who is aware of patient.  Patient will need to be transferred to [facility]. I updated patient and spoke to his daughter on the phone Overall pt is well appearing, appears improved after ER stay  7:20 AM  D/w dr Tana Coast for admission   Trevor [name] was evaluated in Emergency Department on 05/09/2019 for the symptoms described in the history of present illness. He was evaluated in the context of the global COVID-19 pandemic, which necessitated consideration that the patient might be at risk for infection with the SARS-CoV-2 virus that causes COVID-19. Institutional protocols and algorithms that pertain to the evaluation of patients at risk for COVID-19 are in a state of rapid change based on information released by regulatory bodies including the CDC and federal and state organizations. These policies and algorithms were followed during the patient's care in the ED.  Final Clinical Impressions(s) / ED Diagnoses   Final diagnoses:  Acute pulmonary edema ([place])  Hyperkalemia    ED Discharge Orders    None       Ripley Fraise, MD 05/09/19 [phone]

## 2019-05-09 NOTE — ED Notes (Signed)
Report called to [facility], Primitivo Gauze, RN took report

## 2019-05-09 NOTE — ED Notes (Signed)
[facility] called @ 09:44.

## 2019-05-09 NOTE — Progress Notes (Signed)
Dr. Jonnie Finner made aware of patient's arrival to department.

## 2019-05-09 NOTE — Progress Notes (Signed)
Pt had 6 beats run Vtach, VS stable, pt asymptomatic. Schorr, NP notified, will continue to monitor pt.

## 2019-05-09 NOTE — ED Notes (Signed)
Attempted IV x2. 

## 2019-05-09 NOTE — Progress Notes (Signed)
Trevor [name] [id number] Admission Data: 05/09/2019 11:55 AM Attending Provider: Mendel Corning, MD  [id number]:[id number], [name] Consults/ Treatment Team: Treatment Team:  [name], MD  [name] is a 73 y.o. male patient admitted from [facility] ED awake, alert  & oriented  X 4  Full Code, VSS - Blood pressure (!) 165/83, pulse 93, temperature 98.1 F (36.7 C), temperature source Oral, resp. rate 18, SpO2 100 %., O2  4 L nasal cannular, no c/o shortness of breath, no c/o chest pain, no distress noted. Tele # 18 placed and pt is currently running:normal sinus rhythm.   Allergies:  No Known Allergies   Past Medical History:  Diagnosis Date  . AAA (abdominal aortic aneurysm) ([place])   . Anemia   . Anxiety   . Arthritis    arms and back  . Blindness   . CHF (congestive heart failure) ([place])   . Coronary artery disease    mild   . 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 ESRD on hemodialysis ([place])    started HD 2014-15  . Headache(784.0)   . HTN (hypertension)   . Protein-calorie malnutrition ([place])   . STEMI (ST elevation myocardial infarction) ([place]) 11/11/2018  . Stroke ([place]) 01/2018   no residual deficits    Pt orientation to unit, room and routine. Information packet given to patient/family and safety video watched.  Admission INP armband ID verified with patient/family, and in place. SR up x 2, fall risk assessment complete with Patient and family verbalizing understanding of risks associated with falls. Pt verbalizes an understanding of how to use the call bell and to call for help before getting out of bed.  Skin, clean-dry- intact without evidence of bruising, or skin tears.    Will cont to monitor and assist as needed.  Hiram Comber, RN 05/09/2019 11:55 AM

## 2019-05-09 NOTE — ED Notes (Signed)
Bed: XL70 Expected date:  Expected time:  Means of arrival:  Comments: 22M SOB

## 2019-05-09 NOTE — Procedures (Signed)
   I was present at this dialysis session, have reviewed the session itself and made  appropriate changes Kelly Splinter MD [place] pager [phone]   05/09/2019, 5:29 PM

## 2019-05-09 NOTE — H&P (Signed)
History and Physical        Hospital Admission Note Date: 05/09/2019  Patient name: Trevor [name] Medical record number: [id number] Date of birth: 1946/02/23 Age: 73 y.o. Gender: male  PCP: [name]    Patient coming from: [facility] skilled nursing facility  I have reviewed all records in the [facility].    Chief Complaint:  Shortness of breath with chest tightness since last night  HPI: Patient is a 73 year old male with history of ESRD on hemodialysis, MWF, CHF, BPH, CAD, atrial fibrillation, history of CVA, anemia of chronic disease, left AKA, chronic O2 2 L.  Due for shortness of breath and chest tightness.  History was obtained from the patient who states that he was in his baseline state of health up till yesterday evening.  Around midnight, he woke up feeling short of breath and chest tightness.  No fevers or chills but states he has coughing for 2 weeks.  In ED, he was somewhat hypoxic, was placed on O2 4 L.  Patient reports that he did have hemodialysis on Friday. COVID-19 test negative  ED work-up/course:   Sodium 141, potassium 5.5, BUN 55, creatinine 8.2 WBC is 10.3 BNP> 4500 Chest x-ray showed increasing diffuse interstitial and airspace disease, likely represents edema  Review of Systems: Positives marked in 'bold' Constitutional: Denies fever, chills, diaphoresis, poor appetite and fatigue.  HEENT: Denies photophobia, eye pain, redness, hearing loss, ear pain, congestion, sore throat, rhinorrhea, sneezing, mouth sores, trouble swallowing, neck pain, neck stiffness and tinnitus.   Respiratory: Please see HPI Cardiovascular: Chest tightness has improved Gastrointestinal: Denies nausea, vomiting, abdominal pain, diarrhea, constipation, blood in stool and abdominal distention.  Genitourinary: Denies dysuria, urgency, frequency, hematuria,  flank pain and difficulty urinating.  Musculoskeletal: Left AKA, no myalgias Skin: Denies pallor, rash and wound.  Neurological: Denies dizziness, seizures, syncope, weakness, light-headedness, numbness and headaches.  Hematological: Denies adenopathy. Easy bruising, personal or family bleeding history  Psychiatric/Behavioral: Denies suicidal ideation, mood changes, confusion, nervousness, sleep disturbance and agitation  Past Medical History: Past Medical History:  Diagnosis Date  . AAA (abdominal aortic aneurysm) ([place])   . Anemia   . Anxiety   . Arthritis    arms and back  . Blindness   . CHF (congestive heart failure) ([place])   . Coronary artery disease    mild   . 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 ESRD on hemodialysis ([place])    started HD 2014-15  . Headache(784.0)   . HTN (hypertension)   . Protein-calorie malnutrition ([place])   . STEMI (ST elevation myocardial infarction) ([place]) 11/11/2018  . Stroke [facility]) 01/2018   no residual deficits    Past Surgical History:  Procedure Laterality Date  . A/V FISTULAGRAM N/A 07/29/2018   Procedure: A/V FISTULAGRAM - left upper extremity;  Surgeon: Waynetta Sandy, MD;  Location: [place] [facility];  Service: Cardiovascular;  Laterality: N/A;  . AMPUTATION Left 03/01/2019   Procedure: AMPUTATION ABOVE KNEE;  Surgeon: Angelia Mould, MD;  Location: [place];  Service: Vascular;  Laterality: Left;  . AXILLARY-FEMORAL BYPASS GRAFT  06/28/2017   Procedure: BYPASS GRAFT RIGHT AXILLA-BIFEMORAL USING 8MM X 30CM AND 8MM X 60CM HEMASHIELD GOLD GRAFTS;  Surgeon: Rosetta Posner, MD;  Location: [place];  Service: Vascular;;  . CARDIAC CATHETERIZATION N/A 11/01/2016   Procedure: Left Heart Cath and Coronary Angiography;  Surgeon: Lorretta Harp, MD;  Location: [place] [facility];  Service: Cardiovascular;  Laterality: N/A;  . ENDARTERECTOMY FEMORAL Bilateral 06/28/2017   Procedure:  ENDARTERECTOMY BILATERAL FEMORAL ARTERIES;  Surgeon: Rosetta Posner, MD;  Location: [place];  Service: Vascular;  Laterality: Bilateral;  . FISTULA SUPERFICIALIZATION Left 1/61/0960   Procedure: PLICATION OF ARTERIOVENOUS FISTULA LEFT ARM;  Surgeon: Waynetta Sandy, MD;  Location: [place];  Service: Vascular;  Laterality: Left;  . FISTULOGRAM Left 05/20/2018   Procedure: FISTULOGRAM WITH BALLOON ANGIOPLASTY LEFT ARM ARTERIOVENOUS FISTULA;  Surgeon: Waynetta Sandy, MD;  Location: [place];  Service: Vascular;  Laterality: Left;  . INSERTION OF DIALYSIS CATHETER  09/21/2012   Procedure: INSERTION OF DIALYSIS CATHETER;  Surgeon: Angelia Mould, MD;  Location: [place];  Service: Vascular;  Laterality: N/A;  Right Internal Jugular Placement  . LEFT HEART CATH N/A 11/11/2018   Procedure: LEFT HEART CATH;  Surgeon: Sherren Mocha, MD;  Location: [place] [facility];  Service: Cardiovascular;  Laterality: N/A;  . LEFT HEART CATHETERIZATION WITH CORONARY ANGIOGRAM N/A 09/28/2012   Procedure: LEFT HEART CATHETERIZATION WITH CORONARY ANGIOGRAM;  Surgeon: Sherren Mocha, MD;  Location: [facility] CATH LAB;  Service: Cardiovascular;  Laterality: N/A;  . LEFT HEART CATHETERIZATION WITH CORONARY ANGIOGRAM N/A 07/07/2014   Procedure: LEFT HEART CATHETERIZATION WITH CORONARY ANGIOGRAM;  Surgeon: Leonie Man, MD;  Location: [facility] CATH LAB;  Service: Cardiovascular;  Laterality: N/A;  . PERIPHERAL VASCULAR BALLOON ANGIOPLASTY Left 07/29/2018   Procedure: PERIPHERAL VASCULAR BALLOON ANGIOPLASTY;  Surgeon: Waynetta Sandy, MD;  Location: [place] [facility];  Service: Cardiovascular;  Laterality: Left;  CENTRAL VEIN  . PERIPHERAL VASCULAR INTERVENTION Left 07/29/2018   Procedure: PERIPHERAL VASCULAR INTERVENTION;  Surgeon: Waynetta Sandy, MD;  Location: [place] [facility];  Service: Cardiovascular;  Laterality: Left;  AXILLARY VEIN  . REVISION OF ARTERIOVENOUS GORETEX GRAFT Left 12/30/2018    Procedure: REVISION OF ARTERIOVENOUS FISTULA LEFT ARM;  Surgeon: Rosetta Posner, MD;  Location: [place];  Service: Vascular;  Laterality: Left;  . REVISON OF ARTERIOVENOUS FISTULA Left 08/17/2018   Procedure: REVISION OF ARTERIOVENOUS FISTULA LEFT ARM;  Surgeon: Waynetta Sandy, MD;  Location: [place];  Service: Vascular;  Laterality: Left;  . right hand    . TEE WITHOUT CARDIOVERSION  10/09/2011   Procedure: TRANSESOPHAGEAL ECHOCARDIOGRAM (TEE);  Surgeon: Lelon Perla, MD;  Location: [facility] ENDOSCOPY;  Service: Cardiovascular;  Laterality: N/A;    Medications: Prior to Admission medications   Medication Sig Start Date End Date Taking? Authorizing Provider  aspirin EC 81 MG tablet Take 1 tablet (81 mg total) by mouth daily. 11/15/18  Yes Bhagat, Bhavinkumar, PA  atorvastatin (LIPITOR) 80 MG tablet Take 1 tablet (80 mg total) by mouth at bedtime. 11/15/18  Yes Bhagat, Bhavinkumar, PA  b complex-vitamin c-folic acid (NEPHRO-VITE) 0.8 MG TABS tablet Take 1 tablet by mouth daily.   Yes [provider]  bisacodyl (DULCOLAX) 10 MG suppository Place 10 mg rectally daily as needed (constipation).    Yes [provider]  cinacalcet (SENSIPAR) 90 MG tablet Take 90 mg by mouth daily with supper.   Yes [provider]  clopidogrel (PLAVIX) 75 MG tablet Take  1 tablet (75 mg total) by mouth daily. 04/05/15  Yes Barton Dubois, MD  docusate sodium (COLACE) 100 MG capsule Take 200 mg by mouth at bedtime.    Yes [provider]  gabapentin (NEURONTIN) 300 MG capsule Take 1 capsule (300 mg total) by mouth at bedtime. 01/14/18  Yes Colbert Ewing, MD  Glycopyrrolate 15.6 MCG CAPS Place 15.6 mg into inhaler and inhale 2 (two) times a day.   Yes [provider]  Icosapent Ethyl (VASCEPA) 1 g CAPS Take 1 g by mouth 2 (two) times daily.    Yes [provider]  ipratropium-albuterol (DUONEB) 0.5-2.5 (3) MG/3ML SOLN Take 3 mLs by nebulization every 8 (eight) hours  as needed (for copd).   Yes [provider]  isosorbide mononitrate (IMDUR) 30 MG 24 hr tablet Take 1 tablet (30 mg total) by mouth daily. 11/22/18 05/09/19 Yes Kilroy, Luke K, PA-C  metoprolol tartrate (LOPRESSOR) 25 MG tablet Take 0.5 tablets (12.5 mg total) by mouth 2 (two) times daily. 11/15/18  Yes Bhagat, Bhavinkumar, PA  mirtazapine (REMERON) 7.5 MG tablet Take 7.5 mg by mouth at bedtime.   Yes [provider]  nitroGLYCERIN (NITROSTAT) 0.4 MG SL tablet Place 1 tablet (0.4 mg total) under the tongue every 5 (five) minutes x 3 doses as needed for chest pain. 11/15/18  Yes Bhagat, Bhavinkumar, PA  Nutritional Supplements (ENSURE PO) Take 237 mLs by mouth 2 (two) times daily.    Yes [provider]  Nutritional Supplements (FEEDING SUPPLEMENT, NEPRO CARB STEADY,) LIQD Take 237 mLs by mouth 2 (two) times daily between meals. 12/11/16  Yes Geradine Girt, DO  Nutritional Supplements (PROMOD) LIQD Take 30 mLs by mouth 2 (two) times a day.   Yes [provider]  ondansetron (ZOFRAN) 4 MG tablet Take 4 mg by mouth every 6 (six) hours as needed for nausea or vomiting.   Yes [provider]  oxyCODONE (OXY IR/ROXICODONE) 5 MG immediate release tablet Take 1 tablet (5 mg total) by mouth every 6 (six) hours as needed for severe pain. 03/06/19  Yes [name], MD  senna (SENOKOT) 8.6 MG TABS tablet Take 1 tablet by mouth daily.   Yes [provider]  sevelamer carbonate (RENVELA) 800 MG tablet Take 1,600 mg by mouth 3 (three) times daily with meals.  12/13/18  Yes [provider]  doxercalciferol (HECTOROL) 4 MCG/2ML injection Inject 1 mL (2 mcg total) into the vein every Monday, Wednesday, and Friday with hemodialysis. Patient not taking: Reported on 05/09/2019 01/15/18   Colbert Ewing, MD    Allergies:  No Known Allergies  Social History:  reports that he has been smoking cigarettes. He has a 30.00 pack-year smoking history. He has  never used smokeless tobacco. He reports that he does not drink alcohol or use drugs.  Family History: Family History  Problem Relation Age of Onset  . Heart attack Mother        MI in her 31s  . Diabetes Mother   . Alcohol abuse Father   . Anesthesia problems Neg Hx   . Hypotension Neg Hx   . Malignant hyperthermia Neg Hx   . Pseudochol deficiency Neg Hx     Physical Exam: Blood pressure (!) 164/73, pulse 91, temperature (!) 97.4 F (36.3 C), temperature source Oral, resp. rate 16, SpO2 98 %. General: Alert, awake, oriented x3, in no acute distress. Eyes: pink conjunctiva,anicteric sclera, pupils equal and reactive to light and accomodation, HEENT: normocephalic, atraumatic, oropharynx  clear Neck: supple, no masses or lymphadenopathy, no goiter, no bruits, no JVD CVS: Regular rate and rhythm, without murmurs, rubs or gallops. No lower extremity edema Resp : Decreased breath sound at the bases GI : Soft, nontender, nondistended, positive bowel sounds, no masses. No hepatomegaly. No hernia.  Musculoskeletal: Left AKA Neuro: Grossly intact, no focal neurological deficits, strength 5/5 upper and lower extremities, left AKA Psych: alert and oriented x 3, normal mood and affect Skin: no rashes or lesions, warm and dry   LABS on Admission: I have personally reviewed all the labs and imagings below    Basic Metabolic Panel: Recent Labs  Lab 05/09/19 0445  NA 141  K 5.5*  CL 99  CO2 27  GLUCOSE 82  BUN 55*  CREATININE 8.21*  CALCIUM 8.7*   Liver Function Tests: No results for input(s): AST, ALT, ALKPHOS, BILITOT, PROT, ALBUMIN in the last 168 hours. No results for input(s): LIPASE, AMYLASE in the last 168 hours. No results for input(s): AMMONIA in the last 168 hours. CBC: Recent Labs  Lab 05/09/19 0615  WBC 10.3  NEUTROABS 8.2*  HGB 11.8*  HCT 37.6*  MCV 88.7  PLT 127*   Cardiac Enzymes: No results for input(s): CKTOTAL, CKMB, CKMBINDEX, TROPONINI in the last 168  hours. BNP: Invalid input(s): POCBNP CBG: No results for input(s): GLUCAP in the last 168 hours.  Radiological Exams on Admission:  Dg Chest Port 1 View  Result Date: 05/09/2019 CLINICAL DATA:  Shortness of breath.  Cough. EXAM: PORTABLE CHEST 1 VIEW COMPARISON:  One-view chest x-ray 03/01/2019 FINDINGS: Heart size is within normal limits. Atherosclerotic changes are noted in the aorta. Bilateral interstitial and airspace opacities, most prominent at the base have increased. Small effusions are suspected. Chronic changes of COPD are again noted. Degenerative changes are present within the thoracic spine. IMPRESSION: 1. Increasing diffuse interstitial and airspace disease with lower lobe predominance. The patient's novel coronavirus test was negative. This likely represents edema superimposed on chronic disease. Infection is not excluded. 2. Aortic atherosclerosis. Electronically Signed   By: San Morelle M.D.   On: 05/09/2019 06:31      EKG: Independently reviewed. *Rate 97, LVH, early repolarization changes, prolonged QTC   Assessment/Plan Principal Problem:   Acute pulmonary edema (HCC) with hyperkalemia, volume overload, in the setting of ESRD on hemodialysis, acute on chronic respiratory failure with hypoxia -ESRD on HD, MWF, patient reports he had dialysis on Friday -On chronic O2 2 L, placed on 4 L in ED, O2 sats 98%, chest x-ray with pulmonary edema, elevated BNP, hyperkalemia, needs HD -EDP has discussed with nephrology, patient will be admitted to [facility] for urgent HD.  Active Problems: ESRD on hemodialysis, MWF  -Nephrology consulted, continue sevelamer    History of CVA (cerebrovascular accident) -Continue aspirin, Plavix, statin  History of paroxysmal A-fib (HCC) -Continue beta-blocker, not on anticoagulation    Acute on chronic combined systolic and diastolic CHF (congestive heart failure) ([place]) -Needs hemodialysis for volume management, nephrology  consulted -2D echo 10/2018 showed EF of 45 to 42%, grade 1 diastolic dysfunction    Essential hypertension -Restart Imdur, Lopressor, hydralazine IV as needed with parameters    Anemia associated with chronic renal failure H&H currently stable, hemoglobin 11.8  DVT prophylaxis: Heparin subcu  CODE STATUS: Full CODE STATUS  Consults called: EDP, Dr. Christy Gentles has discussed with nephrology for urgent HD at [facility] Communication: Admission, patients condition and plan of care including tests being ordered have  been discussed with the patient who indicates understanding and agree with the plan and Code Status  Admission status:   Disposition plan: Further plan will depend as patient's clinical course evolves and further radiologic and laboratory data become available.    At the time of admission, it appears that the appropriate admission status for this patient is INPATIENT . This is judged to be reasonable and necessary in order to provide the required intensity of service to ensure the patient's safety given the presenting symptoms overload, hyperkalemia in the setting of ESRD, needs hemodialysis, physical exam findings pulmonary edema, and initial radiographic and laboratory data in the context of their chronic comorbidities.  The medical decision making on this patient was of high complexity and the patient is at high risk for clinical deterioration, therefore this is a level 3 visit.     Time Spent on Admission: 60 minutes    Caydence Enck M.D. [facility] 05/09/2019, 8:30 AM

## 2019-05-09 NOTE — Progress Notes (Signed)
Renal Navigator notified OP HD clinic/[facility] of patient's admission and negative COVID 19 rapid test result in order to provide continuity of care and safety.  [name],  Renal Navigator [phone]

## 2019-05-09 NOTE — ED Notes (Signed)
Pt provided with a breakfast tray.  

## 2019-05-10 DIAGNOSIS — Z8673 Personal history of transient ischemic attack (TIA), and cerebral infarction without residual deficits: Secondary | ICD-10-CM | POA: Diagnosis not present

## 2019-05-10 DIAGNOSIS — N189 Chronic kidney disease, unspecified: Secondary | ICD-10-CM | POA: Diagnosis not present

## 2019-05-10 DIAGNOSIS — J9621 Acute and chronic respiratory failure with hypoxia: Secondary | ICD-10-CM | POA: Diagnosis not present

## 2019-05-10 DIAGNOSIS — M2550 Pain in unspecified joint: Secondary | ICD-10-CM | POA: Diagnosis not present

## 2019-05-10 DIAGNOSIS — D631 Anemia in chronic kidney disease: Secondary | ICD-10-CM

## 2019-05-10 DIAGNOSIS — I5043 Acute on chronic combined systolic (congestive) and diastolic (congestive) heart failure: Secondary | ICD-10-CM | POA: Diagnosis not present

## 2019-05-10 DIAGNOSIS — N186 End stage renal disease: Secondary | ICD-10-CM | POA: Diagnosis not present

## 2019-05-10 DIAGNOSIS — Z992 Dependence on renal dialysis: Secondary | ICD-10-CM | POA: Diagnosis not present

## 2019-05-10 DIAGNOSIS — I480 Paroxysmal atrial fibrillation: Secondary | ICD-10-CM | POA: Diagnosis not present

## 2019-05-10 DIAGNOSIS — J810 Acute pulmonary edema: Secondary | ICD-10-CM

## 2019-05-10 DIAGNOSIS — R0600 Dyspnea, unspecified: Secondary | ICD-10-CM | POA: Diagnosis present

## 2019-05-10 DIAGNOSIS — Z7401 Bed confinement status: Secondary | ICD-10-CM | POA: Diagnosis not present

## 2019-05-10 DIAGNOSIS — R5381 Other malaise: Secondary | ICD-10-CM | POA: Diagnosis not present

## 2019-05-10 LAB — BASIC METABOLIC PANEL
Anion gap: 12 (ref 5–15)
BUN: 25 mg/dL — ABNORMAL HIGH (ref 8–23)
CO2: 28 mmol/L (ref 22–32)
Calcium: 8.8 mg/dL — ABNORMAL LOW (ref 8.9–10.3)
Chloride: 98 mmol/L (ref 98–111)
Creatinine, Ser: 4.95 mg/dL — ABNORMAL HIGH (ref 0.61–1.24)
GFR calc Af Amer: 12 mL/min — ABNORMAL LOW (ref >60)
GFR calc non Af Amer: 11 mL/min — ABNORMAL LOW (ref >60)
Glucose, Bld: 85 mg/dL (ref 70–99)
Potassium: 3.8 mmol/L (ref 3.5–5.1)
Sodium: 138 mmol/L (ref 135–145)

## 2019-05-10 LAB — CBC
HCT: 32.7 % — ABNORMAL LOW (ref 39.0–52.0)
Hemoglobin: 10.8 g/dL — ABNORMAL LOW (ref 13.0–17.0)
MCH: 27.8 pg (ref 26.0–34.0)
MCHC: 33 g/dL (ref 30.0–36.0)
MCV: 84.3 fL (ref 80.0–100.0)
Platelets: 111 10*3/uL — ABNORMAL LOW (ref 150–400)
RBC: 3.88 MIL/uL — ABNORMAL LOW (ref 4.22–5.81)
RDW: 15.5 % (ref 11.5–15.5)
WBC: 5.9 10*3/uL (ref 4.0–10.5)
nRBC: 0 % (ref 0.0–0.2)

## 2019-05-10 NOTE — Care Management Obs Status (Signed)
[facility] NOTIFICATION   Patient Details  Name: Trevor [name] MRN: [id number] Date of Birth: 07/20/1946   Medicare Observation Status Notification Given:  Yes    Ella Bodo, RN 05/10/2019, 12:03 PM

## 2019-05-10 NOTE — Progress Notes (Signed)
Renal Navigator notified OP HD clinic of patient's discharge and last inpt HD treatment in order to provide continuity of care and assist with smooth transition from hospital back to clinic.  [name], [place] Renal Navigator [phone]

## 2019-05-10 NOTE — TOC Transition Note (Signed)
Transition of Care [facility]) - CM/SW Discharge Note   Patient Details  Name: JOAHAN SWATZELL MRN: [id number] Date of Birth: [birth year]  Transition of Care [facility]) CM/SW Contact:  Benard Halsted, [place] Phone Number: 05/10/2019, 12:38 PM   Clinical Narrative:    Patient will DC to: [facility] Anticipated DC date: 05/10/19 Family notified: Patient oriented and will notify family Transport by: PTAR 1:30pm   Per MD patient ready for DC to [facility]. RN, patient, patient's family, and facility notified of DC. Discharge Summary and FL2 sent to facility. RN to call report prior to discharge ([phone]). DC packet on chart. Ambulance transport requested for patient.   CSW will sign off for now as social work intervention is no longer needed. Please consult [place] again if new needs arise.  Cedric Fishman, LCSW Clinical Social Worker [phone]    Final next level of care: Skilled Nursing Facility Barriers to Discharge: No Barriers Identified   Patient Goals and CMS Choice Patient states their goals for this hospitalization and ongoing recovery are:: Return to [name]   Choice offered to / list presented to : NA  Discharge Placement PASRR number recieved: 05/10/19            Patient chooses bed at: [facility] Patient to be transferred to facility by: [place] Name of family member notified: Patient oriented x4 Patient and family notified of of transfer: 05/10/19  Discharge Plan and Services In-house Referral: Clinical Social Work Discharge Planning Services: NA Post Acute Care Choice: [place]          DME Arranged: N/A DME Agency: NA       HH Arranged: NA HH Agency: NA        Social Determinants of Health (SDOH) Interventions     Readmission Risk Interventions No flowsheet data found.

## 2019-05-10 NOTE — Consult Note (Signed)
   [facility] CM Inpatient Consult   05/10/2019  Trevor [name] Sep 13, 1946 [id number]    Patient was screened for [facility] Care Management servicesneeds under his Medicare Next/Gen plan. Patient has 45% extreme high risk score for unplanned readmission, with 3 hospitalizations and 1 ED visit in the past 6 months.  Chart review and MD Interim note dated 05/10/19 states as follows: Patient is a 73 year old male with history of end-stage renal disease on hemodialysis, chronic combined CHF, paroxysmal A. fib, CVA, unknown type; anemia of chronic disease, left AKA presented with shortness of breath and chest tightness.  In the ED, he was hypoxic and required 4 L oxygen via nasal cannula.  COVID-19 testing was negative.  Chest x-ray showed increasing diffuse interstitial and airspace disease, likely representing edema.  BNP was more than 4500.  Nephrology was consulted.  He underwent hemodialysis on 05/09/2019.  Subsequently his respiratory status has improved.  He looks stable for discharge.  Nephrology has cleared for outpatient for discharge. (Acute pulmonary edema with hyperkalemia/volume overload, End-stage renal disease on hemodialysis, Acute on chronic hypoxic respiratory failure)  MD discharge summary shows that disposition is [name]- skilled nursing facility. Medical record reveals that patient currently resides at [facility] on [address].  Patient's primary care provider is [facility] which is not currently a beneficiary of the attributed [place] in the [employer].  Reason:His current primary care provider is not a [facility] provider and not affiliated with [employer]  This patient is currently Noteligible for [facility] Care Management Services. Membership roster used to verify non-eligible status.   Will sign off.   For questions, please contact:  Edwena Felty A. Briana Newman, BSN, RN-BC [facility] Liaison Cell: [phone]

## 2019-05-10 NOTE — Progress Notes (Signed)
[name] to be D/C'd to [name] [facility]   per MD order.  Discussed with the patient and all questions fully answered.  VSS, Skin clean, dry and intact without evidence of skin break down, no evidence of skin tears noted. IV catheter discontinued intact. Site without signs and symptoms of complications. Dressing and pressure applied.  An After Visit Summary was printed and given to the patient. Patient received prescription.  D/c education completed with patient/family including follow up instructions, medication list, d/c activities limitations if indicated, with other d/c instructions as indicated by MD - patient able to verbalize understanding, all questions fully answered.   Patient instructed to return to ED, call 911, or call MD for any changes in condition.   Patient escorted via PTAR  To [name] [facility] .  Report given to RN.    Lorenza Evangelist [facility] 05/10/2019 4:51 PM

## 2019-05-10 NOTE — Discharge Summary (Signed)
Physician Discharge Summary  Trevor [name] IRC:[id number] DOB: 05/13/1946 DOA: 05/09/2019  PCP: [name]  Admit date: 05/09/2019 Discharge date: 05/10/2019  Admitted From: [name] Disposition: [name]  Recommendations for Outpatient Follow-up:  1. Follow up with [name] provider at earliest convenience with repeat CBC/BMP in the next 2 days  2. Follow-up with dialysis as an outpatient as scheduled 3. follow up in ED if symptoms worsen or new appear   Home Health: No Equipment/Devices: None  Discharge Condition: Stable CODE STATUS: Full Diet recommendation: Heart healthy/renal hemodialysis diet  Brief/Interim Summary: 73 year old male with history of end-stage renal disease on hemodialysis, chronic combined CHF, paroxysmal A. fib, CVA, unknown type; anemia of chronic disease, left AKA presented with shortness of breath and chest tightness.  In the ED, he was hypoxic and required 4 L oxygen via nasal cannula.  COVID-19 testing was negative.  Chest x-ray showed increasing diffuse interstitial and airspace disease, likely representing edema.  BNP was more than 4500.  Nephrology was consulted.  He underwent hemodialysis on 05/09/2019.  Subsequently his respiratory status has improved.  He looks stable for discharge.  Nephrology has cleared for outpatient for discharge.  Discharge Diagnoses:  Principal Problem:   Acute pulmonary edema (HCC) Active Problems:   History of CVA (cerebrovascular accident)   A-fib ([place])   Acute on chronic combined systolic and diastolic CHF (congestive heart failure) (HCC)   End-stage renal disease on hemodialysis (HCC)   Essential hypertension   Acute respiratory failure with hypoxia (HCC)   Anemia associated with chronic renal failure   Dyspnea  Acute pulmonary edema with hyperkalemia/volume overload End-stage renal disease on hemodialysis Acute on chronic hypoxic respiratory failure -Presented with pulmonary edema on chest x-ray and required 4 L oxygen  via nasal cannula. -Nephrology evaluated the patient.  Patient underwent  hemodialysis on 05/09/2019.  Subsequently his respiratory status has improved.  He looks stable for discharge.  Nephrology has cleared for outpatient for discharge. -Outpatient follow-up with dialysis as scheduled  Paroxysmal A. fib -Continue metoprolol.  Not on anticoagulation.  Outpatient follow-up  Acute on chronic combined systolic and diastolic heart failure -Volume managed by dialysis.  Outpatient follow-up with cardiology.  Echo and 10/2018 showed EF of 45 to 51%, grade 1 diastolic dysfunction  Essential hypertension -Continue Imdur, Lopressor.  Blood pressure stable.  Outpatient follow-up  Anemia of chronic disease -Hemoglobin stable.  Outpatient follow-up   Discharge Instructions  Discharge Instructions    Diet - low sodium heart healthy   Complete by:  As directed    Discharge instructions   Complete by:  As directed    Renal HD diet   Increase activity slowly   Complete by:  As directed      Allergies as of 05/10/2019   No Known Allergies     Medication List    STOP taking these medications   oxyCODONE 5 MG immediate release tablet Commonly known as:  Oxy IR/ROXICODONE     TAKE these medications   aspirin EC 81 MG tablet Take 1 tablet (81 mg total) by mouth daily.   atorvastatin 80 MG tablet Commonly known as:  LIPITOR Take 1 tablet (80 mg total) by mouth at bedtime.   b complex-vitamin c-folic acid 0.8 MG Tabs tablet Take 1 tablet by mouth daily.   bisacodyl 10 MG suppository Commonly known as:  DULCOLAX Place 10 mg rectally daily as needed (constipation).   cinacalcet 90 MG tablet Commonly known as:  SENSIPAR Take 90 mg by mouth  daily with supper.   clopidogrel 75 MG tablet Commonly known as:  PLAVIX Take 1 tablet (75 mg total) by mouth daily.   docusate sodium 100 MG capsule Commonly known as:  COLACE Take 200 mg by mouth at bedtime.   doxercalciferol 4 MCG/2ML  injection Commonly known as:  HECTOROL Inject 1 mL (2 mcg total) into the vein every Monday, Wednesday, and Friday with hemodialysis.   ENSURE PO Take 237 mLs by mouth 2 (two) times daily.   ProMod Liqd Take 30 mLs by mouth 2 (two) times a day.   feeding supplement (NEPRO CARB STEADY) Liqd Take 237 mLs by mouth 2 (two) times daily between meals.   gabapentin 300 MG capsule Commonly known as:  NEURONTIN Take 1 capsule (300 mg total) by mouth at bedtime.   Glycopyrrolate 15.6 MCG Caps Place 15.6 mg into inhaler and inhale 2 (two) times a day.   ipratropium-albuterol 0.5-2.5 (3) MG/3ML Soln Commonly known as:  DUONEB Take 3 mLs by nebulization every 8 (eight) hours as needed (for copd).   isosorbide mononitrate 30 MG 24 hr tablet Commonly known as:  IMDUR Take 1 tablet (30 mg total) by mouth daily.   metoprolol tartrate 25 MG tablet Commonly known as:  LOPRESSOR Take 0.5 tablets (12.5 mg total) by mouth 2 (two) times daily.   mirtazapine 7.5 MG tablet Commonly known as:  REMERON Take 7.5 mg by mouth at bedtime.   nitroGLYCERIN 0.4 MG SL tablet Commonly known as:  NITROSTAT Place 1 tablet (0.4 mg total) under the tongue every 5 (five) minutes x 3 doses as needed for chest pain.   ondansetron 4 MG tablet Commonly known as:  ZOFRAN Take 4 mg by mouth every 6 (six) hours as needed for nausea or vomiting.   senna 8.6 MG Tabs tablet Commonly known as:  SENOKOT Take 1 tablet by mouth daily.   sevelamer carbonate 800 MG tablet Commonly known as:  RENVELA Take 1,600 mg by mouth 3 (three) times daily with meals.   Vascepa 1 g Caps Generic drug:  Icosapent Ethyl Take 1 g by mouth 2 (two) times daily.      Follow-up Information    Clinic, [place]. Schedule an appointment as soon as possible for a visit in 1 week(s).   Contact information: [facility] [address] [phone]        Lelon Perla, MD .   Specialty:   Cardiology Contact information: [address] [place] [address] [phone]        Hemodialysis Follow up.   Why:  As scheduled         No Known Allergies  Consultations:  Nephrology   Procedures/Studies: Dg Chest Port 1 View  Result Date: 05/09/2019 CLINICAL DATA:  Shortness of breath.  Cough. EXAM: PORTABLE CHEST 1 VIEW COMPARISON:  One-view chest x-ray 03/01/2019 FINDINGS: Heart size is within normal limits. Atherosclerotic changes are noted in the aorta. Bilateral interstitial and airspace opacities, most prominent at the base have increased. Small effusions are suspected. Chronic changes of COPD are again noted. Degenerative changes are present within the thoracic spine. IMPRESSION: 1. Increasing diffuse interstitial and airspace disease with lower lobe predominance. The patient's novel coronavirus test was negative. This likely represents edema superimposed on chronic disease. Infection is not excluded. 2. Aortic atherosclerosis. Electronically Signed   By: San Morelle M.D.   On: 05/09/2019 06:31       Subjective: Patient seen and examined at bedside.  He feels much better and thinks that he is ready to go back.  No worsening shortness of breath or vomiting overnight.  Discharge Exam: Vitals:   05/10/19 0600 05/10/19 0936  BP: 137/80 140/69  Pulse: 90 89  Resp: 18   Temp: 98.6 F (37 C)   SpO2: 98%     General: Pt is alert, awake, not in acute distress.  Poor historian Cardiovascular: rate controlled, S1/S2 + Respiratory: bilateral decreased breath sounds at bases with some basilar crackles Abdominal: Soft, NT, ND, bowel sounds + Extremities: no edema, no cyanosis.  Left AKA present    The results of significant diagnostics from this hospitalization (including imaging, microbiology, ancillary and laboratory) are listed below for reference.     Microbiology: Recent Results (from the past 240 hour(s))  SARS Coronavirus 2 (CEPHEID  - Performed in [facility] hospital lab), Hosp Order     Status: None   Collection Time: 05/09/19  4:45 AM  Result Value Ref Range Status   SARS Coronavirus 2 NEGATIVE NEGATIVE Final    Comment: (NOTE) If result is NEGATIVE SARS-CoV-2 target nucleic acids are NOT DETECTED. The SARS-CoV-2 RNA is generally detectable in upper and lower  respiratory specimens during the acute phase of infection. The lowest  concentration of SARS-CoV-2 viral copies this assay can detect is 250  copies / mL. A negative result does not preclude SARS-CoV-2 infection  and should not be used as the sole basis for treatment or other  patient management decisions.  A negative result may occur with  improper specimen collection / handling, submission of specimen other  than nasopharyngeal swab, presence of viral mutation(s) within the  areas targeted by this assay, and inadequate number of viral copies  (<250 copies / mL). A negative result must be combined with clinical  observations, patient history, and epidemiological information. If result is POSITIVE SARS-CoV-2 target nucleic acids are DETECTED. The SARS-CoV-2 RNA is generally detectable in upper and lower  respiratory specimens dur ing the acute phase of infection.  Positive  results are indicative of active infection with SARS-CoV-2.  Clinical  correlation with patient history and other diagnostic information is  necessary to determine patient infection status.  Positive results do  not rule out bacterial infection or co-infection with other viruses. If result is PRESUMPTIVE POSTIVE SARS-CoV-2 nucleic acids MAY BE PRESENT.   A presumptive positive result was obtained on the submitted specimen  and confirmed on repeat testing.  While 2019 novel coronavirus  (SARS-CoV-2) nucleic acids may be present in the submitted sample  additional confirmatory testing may be necessary for epidemiological  and / or clinical management purposes  to differentiate between   SARS-CoV-2 and other Sarbecovirus currently known to infect humans.  If clinically indicated additional testing with an alternate test  methodology [phone]) is advised. The SARS-CoV-2 RNA is generally  detectable in upper and lower respiratory sp ecimens during the acute  phase of infection. The expected result is Negative. Fact Sheet for Patients:  StrictlyIdeas.no Fact Sheet for Healthcare Providers: BankingDealers.co.za This test is not yet approved or cleared by the [place] FDA and has been authorized for detection and/or diagnosis of SARS-CoV-2 by FDA under an Emergency Use Authorization (EUA).  This EUA will remain in effect (meaning this test can be used) for the duration of the COVID-19 declaration under Section 564(b)(1) of the Act, 21 U.S.C. section 360bbb-3(b)(1), unless the authorization is terminated or revoked sooner. Performed at [facility], [place] [address]  [place], [place] [address]   MRSA PCR Screening     Status: None   Collection Time: 05/09/19 12:10 PM  Result Value Ref Range Status   MRSA by PCR NEGATIVE NEGATIVE Final    Comment:        The GeneXpert MRSA Assay (FDA approved for NASAL specimens only), is one component of a comprehensive MRSA colonization surveillance program. It is not intended to diagnose MRSA infection nor to guide or monitor treatment for MRSA infections. Performed at [facility], [place] [address] [place], [place] [address]      Labs: BNP (last 3 results) Recent Labs    08/19/18 1755 05/09/19 0615  BNP >4,500.0* >9,179.1*   Basic Metabolic Panel: Recent Labs  Lab 05/09/19 0445 05/09/19 1420 05/10/19 0250  NA 141  --  138  K 5.5*  --  3.8  CL 99  --  98  CO2 27  --  28  GLUCOSE 82  --  85  BUN 55*  --  25*  CREATININE 8.21* 9.41* 4.95*  CALCIUM 8.7*  --  8.8*   Liver Function Tests: No results for input(s): AST, ALT, ALKPHOS, BILITOT,  PROT, ALBUMIN in the last 168 hours. No results for input(s): LIPASE, AMYLASE in the last 168 hours. No results for input(s): AMMONIA in the last 168 hours. CBC: Recent Labs  Lab 05/09/19 0615 05/10/19 0250  WBC 10.3 5.9  NEUTROABS 8.2*  --   HGB 11.8* 10.8*  HCT 37.6* 32.7*  MCV 88.7 84.3  PLT 127* 111*   Cardiac Enzymes: No results for input(s): CKTOTAL, CKMB, CKMBINDEX, TROPONINI in the last 168 hours. BNP: Invalid input(s): POCBNP CBG: No results for input(s): GLUCAP in the last 168 hours. D-Dimer No results for input(s): DDIMER in the last 72 hours. Hgb A1c No results for input(s): HGBA1C in the last 72 hours. Lipid Profile No results for input(s): CHOL, HDL, LDLCALC, TRIG, CHOLHDL, LDLDIRECT in the last 72 hours. Thyroid function studies No results for input(s): TSH, T4TOTAL, T3FREE, THYROIDAB in the last 72 hours.  Invalid input(s): FREET3 Anemia work up No results for input(s): VITAMINB12, FOLATE, FERRITIN, TIBC, IRON, RETICCTPCT in the last 72 hours. Urinalysis    Component Value Date/Time   COLORURINE YELLOW 04/04/2017 1833   APPEARANCEUR CLEAR 04/04/2017 1833   LABSPEC 1.008 04/04/2017 1833   PHURINE 9.0 (H) 04/04/2017 1833   GLUCOSEU 50 (A) 04/04/2017 1833   HGBUR MODERATE (A) 04/04/2017 1833   BILIRUBINUR NEGATIVE 04/04/2017 1833   KETONESUR NEGATIVE 04/04/2017 1833   PROTEINUR 100 (A) 04/04/2017 1833   UROBILINOGEN 0.2 01/08/2015 2116   NITRITE NEGATIVE 04/04/2017 1833   LEUKOCYTESUR NEGATIVE 04/04/2017 1833   Sepsis Labs Invalid input(s): PROCALCITONIN,  WBC,  LACTICIDVEN Microbiology Recent Results (from the past 240 hour(s))  SARS Coronavirus 2 (CEPHEID - Performed in [facility] hospital lab), Hosp Order     Status: None   Collection Time: 05/09/19  4:45 AM  Result Value Ref Range Status   SARS Coronavirus 2 NEGATIVE NEGATIVE Final    Comment: (NOTE) If result is NEGATIVE SARS-CoV-2 target nucleic acids are NOT DETECTED. The SARS-CoV-2 RNA  is generally detectable in upper and lower  respiratory specimens during the acute phase of infection. The lowest  concentration of SARS-CoV-2 viral copies this assay can detect is 250  copies / mL. A negative result does not preclude SARS-CoV-2 infection  and should not be used as the sole basis for treatment or other  patient management decisions.  A negative result may occur with  improper specimen collection / handling, submission of specimen other  than nasopharyngeal swab, presence of viral mutation(s) within the  areas targeted by this assay, and inadequate number of viral copies  (<250 copies / mL). A negative result must be combined with clinical  observations, patient history, and epidemiological information. If result is POSITIVE SARS-CoV-2 target nucleic acids are DETECTED. The SARS-CoV-2 RNA is generally detectable in upper and lower  respiratory specimens dur ing the acute phase of infection.  Positive  results are indicative of active infection with SARS-CoV-2.  Clinical  correlation with patient history and other diagnostic information is  necessary to determine patient infection status.  Positive results do  not rule out bacterial infection or co-infection with other viruses. If result is PRESUMPTIVE POSTIVE SARS-CoV-2 nucleic acids MAY BE PRESENT.   A presumptive positive result was obtained on the submitted specimen  and confirmed on repeat testing.  While 2019 novel coronavirus  (SARS-CoV-2) nucleic acids may be present in the submitted sample  additional confirmatory testing may be necessary for epidemiological  and / or clinical management purposes  to differentiate between  SARS-CoV-2 and other Sarbecovirus currently known to infect humans.  If clinically indicated additional testing with an alternate test  methodology [phone]) is advised. The SARS-CoV-2 RNA is generally  detectable in upper and lower respiratory sp ecimens during the acute  phase of  infection. The expected result is Negative. Fact Sheet for Patients:  StrictlyIdeas.no Fact Sheet for Healthcare Providers: BankingDealers.co.za This test is not yet approved or cleared by the [place] FDA and has been authorized for detection and/or diagnosis of SARS-CoV-2 by FDA under an Emergency Use Authorization (EUA).  This EUA will remain in effect (meaning this test can be used) for the duration of the COVID-19 declaration under Section 564(b)(1) of the Act, 21 U.S.C. section 360bbb-3(b)(1), unless the authorization is terminated or revoked sooner. Performed at [facility], [place] [address], [place], [place] [address]   MRSA PCR Screening     Status: None   Collection Time: 05/09/19 12:10 PM  Result Value Ref Range Status   MRSA by PCR NEGATIVE NEGATIVE Final    Comment:        The GeneXpert MRSA Assay (FDA approved for NASAL specimens only), is one component of a comprehensive MRSA colonization surveillance program. It is not intended to diagnose MRSA infection nor to guide or monitor treatment for MRSA infections. Performed at [facility], [place] [address], [place], [place] [address]      Time coordinating discharge: 35 minutes  SIGNED:   Aline August, MD  [facility] 05/10/2019, 10:08 AM

## 2019-05-10 NOTE — Care Management CC44 (Signed)
Condition Code 44 Documentation Completed  Patient Details  Name: Trevor [name] MRN: [id number] Date of Birth: Nov 08, 1946   Condition Code 44 given:  Yes Patient signature on Condition Code 44 notice:  Yes Documentation of 2 MD's agreement:  Yes Code 44 added to claim:  Yes    Ella Bodo, RN 05/10/2019, 12:04 PM

## 2019-05-10 NOTE — NC FL2 (Signed)
[place] MEDICAID FL2 LEVEL OF CARE SCREENING TOOL     IDENTIFICATION  Patient Name: Trevor [name] Birthdate: 1946/01/24 Sex: male Admission Date (Current Location): 05/09/2019  [facility] [place] Number:  Herbalist and Address:  [place], [place] [address], [place], [place] [address]      Provider Number: [id number]  Attending Physician Name and Address:  Aline August, MD  Relative Name and Phone Number:  Maree Erie, daughter, [phone]    Current Level of Care: Hospital Recommended Level of Care: [place] Prior Approval Number:    Date Approved/Denied:   PASRR Number:    Discharge Plan: [name]    Current Diagnoses: Patient Active Problem List   Diagnosis Date Noted  . Dyspnea 05/10/2019  . Anemia associated with chronic renal failure   . Abnormal CXR 02/28/2019  . Marijuana abuse 02/28/2019  . Congestive heart failure (CHF) ([place]) 02/28/2019  . Cellulitis 02/28/2019  . Sepsis ([place]) 02/28/2019  . History of ST elevation myocardial infarction (STEMI) 11/11/2018  . Pulmonary edema 09/20/2018  . Acute respiratory failure with hypoxia ([place]) 03/29/2018  . Sleep stage dysfunction 11/10/2017  . 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 Vascular device, implant, or graft complication 26/94/8546  . Peripheral arterial disease ([place]) 10/20/2017  . Non-allergic rhinitis 10/20/2017  . 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 BPH (benign prostatic hyperplasia) 07/07/2017  . At risk for adverse drug reaction 07/02/2017  . Acute pulmonary edema ([place]) 06/27/2017  . Hypertensive urgency 01/24/2017  . 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 Ischemic chest pain ([place]) 11/01/2016  . Essential hypertension 03/08/2016  . Dyslipidemia 01/12/2016  . Chest pain 08/12/2015  . Accelerated hypertension 08/12/2015  . Carotid stenosis   . Abdominal aortic aneurysm ([place]) 03/06/2015  . Protein-calorie malnutrition, severe ([place]) 05/20/2014  .  End-stage renal disease on hemodialysis ([place]) 05/19/2014  . Anemia of renal disease 05/19/2014  . Coronary artery disease, non-occlusive:  09/28/2012  . Secondary hyperparathyroidism ([place]) 09/27/2012  . Non compliance with medical treatment 09/27/2012  . Acute on chronic combined systolic and diastolic CHF (congestive heart failure) ([place]) 10/10/2011  . A-fib ([place]) 10/08/2011  . History of CVA (cerebrovascular accident) 10/07/2011  . Cocaine abuse ([place]) 10/07/2011  . Tobacco abuse 08/07/2011  . Bruit 08/07/2011    Orientation RESPIRATION BLADDER Height & Weight     Self, Time, Situation, Place  Normal Continent Weight: 113 lb 1.5 oz (51.3 kg) Height:     BEHAVIORAL SYMPTOMS/MOOD NEUROLOGICAL BOWEL NUTRITION STATUS      Continent Diet(Please see DC Summary)  AMBULATORY STATUS COMMUNICATION OF NEEDS Skin   Limited Assist Verbally Normal                       Personal Care Assistance Level of Assistance  Bathing, Feeding, Dressing Bathing Assistance: Maximum assistance Feeding assistance: Independent Dressing Assistance: Limited assistance     Functional Limitations Info  Sight, Hearing, Speech Sight Info: Impaired Hearing Info: Adequate Speech Info: Adequate    SPECIAL CARE FACTORS FREQUENCY                       Contractures Contractures Info: Not present    Additional Factors Info  Code Status, Allergies Code Status Info: Full Allergies Info: NKA           Current Medications (05/10/2019):  This is the current hospital active medication list Current Facility-Administered Medications  Medication Dose Route Frequency Provider Last Rate  Last Dose  . acetaminophen (TYLENOL) tablet 650 mg  650 mg Oral Q6H PRN Rai, Ripudeep K, MD       Or  . acetaminophen (TYLENOL) suppository 650 mg  650 mg Rectal Q6H PRN Rai, Ripudeep K, MD      . aspirin EC tablet 81 mg  81 mg Oral Daily Rai, Ripudeep K, MD   81 mg at 05/10/19 0938  . atorvastatin (LIPITOR) tablet 80  mg  80 mg Oral QHS Rai, Ripudeep K, MD   80 mg at 05/09/19 2152  . bisacodyl (DULCOLAX) suppository 10 mg  10 mg Rectal Daily PRN Rai, Ripudeep K, MD      . Chlorhexidine Gluconate Cloth 2 % PADS 6 each  6 each Topical Q0600 Alric Seton, PA-C   6 each at 05/09/19 1050  . cinacalcet (SENSIPAR) tablet 90 mg  90 mg Oral Q supper Rai, Ripudeep K, MD      . clopidogrel (PLAVIX) tablet 75 mg  75 mg Oral Daily Rai, Ripudeep K, MD   75 mg at 05/10/19 0938  . docusate sodium (COLACE) capsule 200 mg  200 mg Oral QHS Rai, Ripudeep K, MD   200 mg at 05/09/19 2152  . doxercalciferol (HECTOROL) injection 1 mcg  1 mcg Intravenous Q M,W,F-HD Alric Seton, PA-C      . feeding supplement (NEPRO CARB STEADY) liquid 237 mL  237 mL Oral BID BM Rai, Ripudeep K, MD   237 mL at 05/10/19 1019  . gabapentin (NEURONTIN) capsule 300 mg  300 mg Oral QHS Rai, Ripudeep K, MD   300 mg at 05/09/19 2152  . heparin injection 5,000 Units  5,000 Units Subcutaneous Q8H Rai, Ripudeep K, MD   5,000 Units at 05/10/19 0609  . ipratropium-albuterol (DUONEB) 0.5-2.5 (3) MG/3ML nebulizer solution 3 mL  3 mL Nebulization Q8H PRN Rai, Ripudeep K, MD      . isosorbide mononitrate (IMDUR) 24 hr tablet 30 mg  30 mg Oral Daily Rai, Ripudeep K, MD   30 mg at 05/10/19 0938  . metoprolol tartrate (LOPRESSOR) tablet 12.5 mg  12.5 mg Oral BID Rai, Ripudeep K, MD   12.5 mg at 05/10/19 0936  . mirtazapine (REMERON) tablet 7.5 mg  7.5 mg Oral QHS Rai, Ripudeep K, MD   7.5 mg at 05/09/19 2152  . multivitamin (RENA-VIT) tablet 1 tablet  1 tablet Oral QHS Rai, Ripudeep K, MD   1 tablet at 05/09/19 2151  . nitroGLYCERIN (NITROSTAT) SL tablet 0.4 mg  0.4 mg Sublingual Q5 Min x 3 PRN Rai, Ripudeep K, MD      . ondansetron (ZOFRAN) tablet 4 mg  4 mg Oral Q6H PRN Rai, Ripudeep K, MD       Or  . ondansetron (ZOFRAN) injection 4 mg  4 mg Intravenous Q6H PRN Rai, Ripudeep K, MD      . oxyCODONE (Oxy IR/ROXICODONE) immediate release tablet 5 mg  5 mg Oral Q6H  PRN Rai, Ripudeep K, MD      . senna (SENOKOT) tablet 8.6 mg  1 tablet Oral Daily Rai, Ripudeep K, MD   8.6 mg at 05/10/19 0938  . sevelamer carbonate (RENVELA) tablet 1,600 mg  1,600 mg Oral TID WC Rai, Ripudeep K, MD   1,600 mg at 05/10/19 4098     Discharge Medications: Please see discharge summary for a list of discharge medications.  Relevant Imaging Results:  Relevant Lab Results:   Additional Information SSN: [id number] Dialysis patient MWF   COVID  Negative on 6/8  Lissa Morales Kelbie Moro, LCSW

## 2019-05-10 NOTE — TOC Initial Note (Signed)
Transition of Care [facility]) - Initial/Assessment Note    Patient Details  Name: Trevor [name] MRN: [id number] Date of Birth: 02-12-1946  Transition of Care [facility]) CM/SW Contact:    Benard Halsted, LCSW Phone Number: 05/10/2019, 12:37 PM  Clinical Narrative:                 Patient resides at [facility] and will return today.   Expected Discharge Plan: Skilled Nursing Facility Barriers to Discharge: No Barriers Identified   Patient Goals and CMS Choice Patient states their goals for this hospitalization and ongoing recovery are:: Return to [name]   Choice offered to / list presented to : NA  Expected Discharge Plan and Services Expected Discharge Plan: [place] In-house Referral: Clinical Social Work Discharge Planning Services: NA Post Acute Care Choice: [place] Living arrangements for the past 2 months: [place] Expected Discharge Date: 05/10/19               DME Arranged: N/A DME Agency: NA       HH Arranged: NA [place] Agency: NA        Prior Living Arrangements/Services Living arrangements for the past 2 months: [place] Lives with:: Facility Resident Patient language and need for interpreter reviewed:: Yes Do you feel safe going back to the place where you live?: Yes      Need for Family Participation in Patient Care: No (Comment) Care giver support system in place?: Yes (comment)   Criminal Activity/Legal Involvement Pertinent to Current Situation/Hospitalization: No - Comment as needed  Activities of Daily Living Home Assistive Devices/Equipment: Blood pressure cuff, Grab bars around toilet, Grab bars in shower, Hand-held shower hose, Hospital bed, Wheelchair, Scales, Oxygen, Nebulizer([facility] has necessary equipment for their residents) ADL Screening (condition at time of admission) Patient's cognitive ability adequate to safely complete daily activities?: Yes Is the patient deaf or  have difficulty hearing?: No Does the patient have difficulty seeing, even when wearing glasses/contacts?: No Does the patient have difficulty concentrating, remembering, or making decisions?: No Patient able to express need for assistance with ADLs?: Yes Does the patient have difficulty dressing or bathing?: Yes Independently performs ADLs?: No Communication: Independent Dressing (OT): Needs assistance Is this a change from baseline?: Pre-admission baseline Grooming: Needs assistance Is this a change from baseline?: Pre-admission baseline Feeding: Needs assistance Is this a change from baseline?: Pre-admission baseline Bathing: Needs assistance Is this a change from baseline?: Pre-admission baseline Toileting: Needs assistance Is this a change from baseline?: Pre-admission baseline In/Out Bed: Needs assistance Is this a change from baseline?: Pre-admission baseline Walks in Home: Needs assistance Is this a change from baseline?: Pre-admission baseline Does the patient have difficulty walking or climbing stairs?: Yes Weakness of Legs: Right(left aka) Weakness of Arms/Hands: Both  Permission Sought/Granted Permission sought to share information with : Facility Art therapist granted to share information with : Yes, Verbal Permission Granted     Permission granted to share info w AGENCY: [facility]        Emotional Assessment Appearance:: Appears stated age Attitude/Demeanor/Rapport: Gracious Affect (typically observed): Accepting Orientation: : Oriented to Self, Oriented to Place, Oriented to  Time, Oriented to Situation Alcohol / Substance Use: Not Applicable Psych Involvement: No (comment)  Admission diagnosis:  Hyperkalemia [E87.5] Acute pulmonary edema (HCC) [J81.0] Patient Active Problem List   Diagnosis Date Noted  . Dyspnea 05/10/2019  . Anemia associated with chronic renal failure   . Abnormal CXR 02/28/2019  .  Marijuana abuse  02/28/2019  . Congestive heart failure (CHF) ([place]) 02/28/2019  . Cellulitis 02/28/2019  . Sepsis ([place]) 02/28/2019  . History of ST elevation myocardial infarction (STEMI) 11/11/2018  . Pulmonary edema 09/20/2018  . Acute respiratory failure with hypoxia ([place]) 03/29/2018  . Sleep stage dysfunction 11/10/2017  . 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 Vascular device, implant, or graft complication 96/75/9163  . Peripheral arterial disease ([place]) 10/20/2017  . Non-allergic rhinitis 10/20/2017  . 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 BPH (benign prostatic hyperplasia) 07/07/2017  . At risk for adverse drug reaction 07/02/2017  . Acute pulmonary edema ([place]) 06/27/2017  . Hypertensive urgency 01/24/2017  . 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 Ischemic chest pain ([place]) 11/01/2016  . Essential hypertension 03/08/2016  . Dyslipidemia 01/12/2016  . Chest pain 08/12/2015  . Accelerated hypertension 08/12/2015  . Carotid stenosis   . Abdominal aortic aneurysm ([place]) 03/06/2015  . Protein-calorie malnutrition, severe ([place]) 05/20/2014  . End-stage renal disease on hemodialysis ([place]) 05/19/2014  . Anemia of renal disease 05/19/2014  . Coronary artery disease, non-occlusive:  09/28/2012  . Secondary hyperparathyroidism ([place]) 09/27/2012  . Non compliance with medical treatment 09/27/2012  . Acute on chronic combined systolic and diastolic CHF (congestive heart failure) ([place]) 10/10/2011  . A-fib ([place]) 10/08/2011  . History of CVA (cerebrovascular accident) 10/07/2011  . Cocaine abuse ([place]) 10/07/2011  . Tobacco abuse 08/07/2011  . Bruit 08/07/2011   PCP:  Clinic, [place]:  No Pharmacies Listed    Social Determinants of Health (SDOH) Interventions    Readmission Risk Interventions No flowsheet data found.

## 2019-05-12 DIAGNOSIS — J449 Chronic obstructive pulmonary disease, unspecified: Secondary | ICD-10-CM | POA: Diagnosis not present

## 2019-05-12 DIAGNOSIS — I739 Peripheral vascular disease, unspecified: Secondary | ICD-10-CM | POA: Diagnosis not present

## 2019-05-12 DIAGNOSIS — E785 Hyperlipidemia, unspecified: Secondary | ICD-10-CM | POA: Diagnosis not present

## 2019-05-12 DIAGNOSIS — I679 Cerebrovascular disease, unspecified: Secondary | ICD-10-CM | POA: Diagnosis not present

## 2019-05-12 DIAGNOSIS — D649 Anemia, unspecified: Secondary | ICD-10-CM | POA: Diagnosis not present

## 2019-05-12 DIAGNOSIS — N186 End stage renal disease: Secondary | ICD-10-CM | POA: Diagnosis not present

## 2019-05-13 DIAGNOSIS — N186 End stage renal disease: Secondary | ICD-10-CM | POA: Diagnosis not present

## 2019-05-13 DIAGNOSIS — J449 Chronic obstructive pulmonary disease, unspecified: Secondary | ICD-10-CM | POA: Diagnosis not present

## 2019-05-13 DIAGNOSIS — E785 Hyperlipidemia, unspecified: Secondary | ICD-10-CM | POA: Diagnosis not present

## 2019-05-13 DIAGNOSIS — I679 Cerebrovascular disease, unspecified: Secondary | ICD-10-CM | POA: Diagnosis not present

## 2019-05-20 DIAGNOSIS — M6281 Muscle weakness (generalized): Secondary | ICD-10-CM | POA: Diagnosis not present

## 2019-05-20 DIAGNOSIS — R2689 Other abnormalities of gait and mobility: Secondary | ICD-10-CM | POA: Diagnosis not present

## 2019-05-20 DIAGNOSIS — M25511 Pain in right shoulder: Secondary | ICD-10-CM | POA: Diagnosis not present

## 2019-05-23 DIAGNOSIS — M25511 Pain in right shoulder: Secondary | ICD-10-CM | POA: Diagnosis not present

## 2019-05-23 DIAGNOSIS — M6281 Muscle weakness (generalized): Secondary | ICD-10-CM | POA: Diagnosis not present

## 2019-05-23 DIAGNOSIS — R2689 Other abnormalities of gait and mobility: Secondary | ICD-10-CM | POA: Diagnosis not present

## 2019-05-24 DIAGNOSIS — R2689 Other abnormalities of gait and mobility: Secondary | ICD-10-CM | POA: Diagnosis not present

## 2019-05-24 DIAGNOSIS — M25511 Pain in right shoulder: Secondary | ICD-10-CM | POA: Diagnosis not present

## 2019-05-24 DIAGNOSIS — M6281 Muscle weakness (generalized): Secondary | ICD-10-CM | POA: Diagnosis not present

## 2019-05-25 DIAGNOSIS — M6281 Muscle weakness (generalized): Secondary | ICD-10-CM | POA: Diagnosis not present

## 2019-05-25 DIAGNOSIS — R2689 Other abnormalities of gait and mobility: Secondary | ICD-10-CM | POA: Diagnosis not present

## 2019-05-25 DIAGNOSIS — M25511 Pain in right shoulder: Secondary | ICD-10-CM | POA: Diagnosis not present

## 2019-05-26 DIAGNOSIS — M6281 Muscle weakness (generalized): Secondary | ICD-10-CM | POA: Diagnosis not present

## 2019-05-26 DIAGNOSIS — M25511 Pain in right shoulder: Secondary | ICD-10-CM | POA: Diagnosis not present

## 2019-05-26 DIAGNOSIS — R2689 Other abnormalities of gait and mobility: Secondary | ICD-10-CM | POA: Diagnosis not present

## 2019-05-27 DIAGNOSIS — M25511 Pain in right shoulder: Secondary | ICD-10-CM | POA: Diagnosis not present

## 2019-05-27 DIAGNOSIS — R2689 Other abnormalities of gait and mobility: Secondary | ICD-10-CM | POA: Diagnosis not present

## 2019-05-27 DIAGNOSIS — M6281 Muscle weakness (generalized): Secondary | ICD-10-CM | POA: Diagnosis not present

## 2019-06-01 DIAGNOSIS — R2689 Other abnormalities of gait and mobility: Secondary | ICD-10-CM | POA: Diagnosis not present

## 2019-06-01 DIAGNOSIS — M25511 Pain in right shoulder: Secondary | ICD-10-CM | POA: Diagnosis not present

## 2019-06-01 DIAGNOSIS — M6281 Muscle weakness (generalized): Secondary | ICD-10-CM | POA: Diagnosis not present

## 2019-06-02 DIAGNOSIS — R2689 Other abnormalities of gait and mobility: Secondary | ICD-10-CM | POA: Diagnosis not present

## 2019-06-02 DIAGNOSIS — M25511 Pain in right shoulder: Secondary | ICD-10-CM | POA: Diagnosis not present

## 2019-06-02 DIAGNOSIS — M6281 Muscle weakness (generalized): Secondary | ICD-10-CM | POA: Diagnosis not present

## 2019-06-03 DIAGNOSIS — M6281 Muscle weakness (generalized): Secondary | ICD-10-CM | POA: Diagnosis not present

## 2019-06-03 DIAGNOSIS — R2689 Other abnormalities of gait and mobility: Secondary | ICD-10-CM | POA: Diagnosis not present

## 2019-06-03 DIAGNOSIS — M25511 Pain in right shoulder: Secondary | ICD-10-CM | POA: Diagnosis not present

## 2019-06-06 DIAGNOSIS — M6281 Muscle weakness (generalized): Secondary | ICD-10-CM | POA: Diagnosis not present

## 2019-06-06 DIAGNOSIS — R2689 Other abnormalities of gait and mobility: Secondary | ICD-10-CM | POA: Diagnosis not present

## 2019-06-06 DIAGNOSIS — M25511 Pain in right shoulder: Secondary | ICD-10-CM | POA: Diagnosis not present

## 2019-06-07 DIAGNOSIS — R2689 Other abnormalities of gait and mobility: Secondary | ICD-10-CM | POA: Diagnosis not present

## 2019-06-07 DIAGNOSIS — M6281 Muscle weakness (generalized): Secondary | ICD-10-CM | POA: Diagnosis not present

## 2019-06-07 DIAGNOSIS — M25511 Pain in right shoulder: Secondary | ICD-10-CM | POA: Diagnosis not present

## 2019-06-09 DIAGNOSIS — M25511 Pain in right shoulder: Secondary | ICD-10-CM | POA: Diagnosis not present

## 2019-06-09 DIAGNOSIS — M6281 Muscle weakness (generalized): Secondary | ICD-10-CM | POA: Diagnosis not present

## 2019-06-09 DIAGNOSIS — R2689 Other abnormalities of gait and mobility: Secondary | ICD-10-CM | POA: Diagnosis not present

## 2019-06-10 DIAGNOSIS — M25511 Pain in right shoulder: Secondary | ICD-10-CM | POA: Diagnosis not present

## 2019-06-10 DIAGNOSIS — R2689 Other abnormalities of gait and mobility: Secondary | ICD-10-CM | POA: Diagnosis not present

## 2019-06-10 DIAGNOSIS — M6281 Muscle weakness (generalized): Secondary | ICD-10-CM | POA: Diagnosis not present

## 2019-06-13 DIAGNOSIS — N186 End stage renal disease: Secondary | ICD-10-CM | POA: Diagnosis not present

## 2019-06-13 DIAGNOSIS — I679 Cerebrovascular disease, unspecified: Secondary | ICD-10-CM | POA: Diagnosis not present

## 2019-06-13 DIAGNOSIS — E785 Hyperlipidemia, unspecified: Secondary | ICD-10-CM | POA: Diagnosis not present

## 2019-06-13 DIAGNOSIS — J449 Chronic obstructive pulmonary disease, unspecified: Secondary | ICD-10-CM | POA: Diagnosis not present

## 2019-06-15 DIAGNOSIS — R2689 Other abnormalities of gait and mobility: Secondary | ICD-10-CM | POA: Diagnosis not present

## 2019-06-15 DIAGNOSIS — M25511 Pain in right shoulder: Secondary | ICD-10-CM | POA: Diagnosis not present

## 2019-06-15 DIAGNOSIS — M6281 Muscle weakness (generalized): Secondary | ICD-10-CM | POA: Diagnosis not present

## 2019-06-16 DIAGNOSIS — M6281 Muscle weakness (generalized): Secondary | ICD-10-CM | POA: Diagnosis not present

## 2019-06-16 DIAGNOSIS — M25511 Pain in right shoulder: Secondary | ICD-10-CM | POA: Diagnosis not present

## 2019-06-16 DIAGNOSIS — R2689 Other abnormalities of gait and mobility: Secondary | ICD-10-CM | POA: Diagnosis not present

## 2019-06-17 DIAGNOSIS — M6281 Muscle weakness (generalized): Secondary | ICD-10-CM | POA: Diagnosis not present

## 2019-06-17 DIAGNOSIS — M25511 Pain in right shoulder: Secondary | ICD-10-CM | POA: Diagnosis not present

## 2019-06-17 DIAGNOSIS — R2689 Other abnormalities of gait and mobility: Secondary | ICD-10-CM | POA: Diagnosis not present

## 2019-06-20 DIAGNOSIS — I679 Cerebrovascular disease, unspecified: Secondary | ICD-10-CM | POA: Diagnosis not present

## 2019-06-20 DIAGNOSIS — E785 Hyperlipidemia, unspecified: Secondary | ICD-10-CM | POA: Diagnosis not present

## 2019-06-20 DIAGNOSIS — J449 Chronic obstructive pulmonary disease, unspecified: Secondary | ICD-10-CM | POA: Diagnosis not present

## 2019-06-20 DIAGNOSIS — N186 End stage renal disease: Secondary | ICD-10-CM | POA: Diagnosis not present

## 2019-06-21 DIAGNOSIS — M6281 Muscle weakness (generalized): Secondary | ICD-10-CM | POA: Diagnosis not present

## 2019-06-21 DIAGNOSIS — M25511 Pain in right shoulder: Secondary | ICD-10-CM | POA: Diagnosis not present

## 2019-06-21 DIAGNOSIS — R2689 Other abnormalities of gait and mobility: Secondary | ICD-10-CM | POA: Diagnosis not present

## 2019-06-23 DIAGNOSIS — M6281 Muscle weakness (generalized): Secondary | ICD-10-CM | POA: Diagnosis not present

## 2019-06-23 DIAGNOSIS — R2689 Other abnormalities of gait and mobility: Secondary | ICD-10-CM | POA: Diagnosis not present

## 2019-06-23 DIAGNOSIS — M25511 Pain in right shoulder: Secondary | ICD-10-CM | POA: Diagnosis not present

## 2019-06-24 DIAGNOSIS — R2689 Other abnormalities of gait and mobility: Secondary | ICD-10-CM | POA: Diagnosis not present

## 2019-06-24 DIAGNOSIS — M25511 Pain in right shoulder: Secondary | ICD-10-CM | POA: Diagnosis not present

## 2019-06-24 DIAGNOSIS — M6281 Muscle weakness (generalized): Secondary | ICD-10-CM | POA: Diagnosis not present

## 2019-06-28 DIAGNOSIS — M25511 Pain in right shoulder: Secondary | ICD-10-CM | POA: Diagnosis not present

## 2019-06-28 DIAGNOSIS — M6281 Muscle weakness (generalized): Secondary | ICD-10-CM | POA: Diagnosis not present

## 2019-06-28 DIAGNOSIS — R2689 Other abnormalities of gait and mobility: Secondary | ICD-10-CM | POA: Diagnosis not present

## 2019-06-29 DIAGNOSIS — R2689 Other abnormalities of gait and mobility: Secondary | ICD-10-CM | POA: Diagnosis not present

## 2019-06-29 DIAGNOSIS — M6281 Muscle weakness (generalized): Secondary | ICD-10-CM | POA: Diagnosis not present

## 2019-06-29 DIAGNOSIS — M25511 Pain in right shoulder: Secondary | ICD-10-CM | POA: Diagnosis not present

## 2019-06-30 DIAGNOSIS — R2689 Other abnormalities of gait and mobility: Secondary | ICD-10-CM | POA: Diagnosis not present

## 2019-06-30 DIAGNOSIS — M25511 Pain in right shoulder: Secondary | ICD-10-CM | POA: Diagnosis not present

## 2019-06-30 DIAGNOSIS — M6281 Muscle weakness (generalized): Secondary | ICD-10-CM | POA: Diagnosis not present

## 2019-07-05 DIAGNOSIS — R2689 Other abnormalities of gait and mobility: Secondary | ICD-10-CM | POA: Diagnosis not present

## 2019-07-05 DIAGNOSIS — M6281 Muscle weakness (generalized): Secondary | ICD-10-CM | POA: Diagnosis not present

## 2019-07-05 DIAGNOSIS — M25511 Pain in right shoulder: Secondary | ICD-10-CM | POA: Diagnosis not present

## 2019-07-06 DIAGNOSIS — D649 Anemia, unspecified: Secondary | ICD-10-CM | POA: Diagnosis not present

## 2019-07-06 DIAGNOSIS — R2689 Other abnormalities of gait and mobility: Secondary | ICD-10-CM | POA: Diagnosis not present

## 2019-07-06 DIAGNOSIS — N186 End stage renal disease: Secondary | ICD-10-CM | POA: Diagnosis not present

## 2019-07-06 DIAGNOSIS — M6281 Muscle weakness (generalized): Secondary | ICD-10-CM | POA: Diagnosis not present

## 2019-07-06 DIAGNOSIS — J449 Chronic obstructive pulmonary disease, unspecified: Secondary | ICD-10-CM | POA: Diagnosis not present

## 2019-07-06 DIAGNOSIS — I679 Cerebrovascular disease, unspecified: Secondary | ICD-10-CM | POA: Diagnosis not present

## 2019-07-06 DIAGNOSIS — M25511 Pain in right shoulder: Secondary | ICD-10-CM | POA: Diagnosis not present

## 2019-07-07 DIAGNOSIS — M25511 Pain in right shoulder: Secondary | ICD-10-CM | POA: Diagnosis not present

## 2019-07-07 DIAGNOSIS — R2689 Other abnormalities of gait and mobility: Secondary | ICD-10-CM | POA: Diagnosis not present

## 2019-07-07 DIAGNOSIS — M6281 Muscle weakness (generalized): Secondary | ICD-10-CM | POA: Diagnosis not present

## 2019-07-07 DIAGNOSIS — R208 Other disturbances of skin sensation: Secondary | ICD-10-CM | POA: Diagnosis not present

## 2019-07-07 DIAGNOSIS — M79671 Pain in right foot: Secondary | ICD-10-CM | POA: Diagnosis not present

## 2019-07-08 DIAGNOSIS — N186 End stage renal disease: Secondary | ICD-10-CM | POA: Diagnosis not present

## 2019-07-08 DIAGNOSIS — I679 Cerebrovascular disease, unspecified: Secondary | ICD-10-CM | POA: Diagnosis not present

## 2019-07-08 DIAGNOSIS — I739 Peripheral vascular disease, unspecified: Secondary | ICD-10-CM | POA: Diagnosis not present

## 2019-07-08 DIAGNOSIS — Z992 Dependence on renal dialysis: Secondary | ICD-10-CM | POA: Diagnosis not present

## 2019-07-10 DIAGNOSIS — R2689 Other abnormalities of gait and mobility: Secondary | ICD-10-CM | POA: Diagnosis not present

## 2019-07-10 DIAGNOSIS — M6281 Muscle weakness (generalized): Secondary | ICD-10-CM | POA: Diagnosis not present

## 2019-07-10 DIAGNOSIS — M25511 Pain in right shoulder: Secondary | ICD-10-CM | POA: Diagnosis not present

## 2019-07-11 DIAGNOSIS — M6281 Muscle weakness (generalized): Secondary | ICD-10-CM | POA: Diagnosis not present

## 2019-07-11 DIAGNOSIS — R2689 Other abnormalities of gait and mobility: Secondary | ICD-10-CM | POA: Diagnosis not present

## 2019-07-11 DIAGNOSIS — M25511 Pain in right shoulder: Secondary | ICD-10-CM | POA: Diagnosis not present

## 2019-07-12 DIAGNOSIS — R2689 Other abnormalities of gait and mobility: Secondary | ICD-10-CM | POA: Diagnosis not present

## 2019-07-12 DIAGNOSIS — M25511 Pain in right shoulder: Secondary | ICD-10-CM | POA: Diagnosis not present

## 2019-07-12 DIAGNOSIS — M6281 Muscle weakness (generalized): Secondary | ICD-10-CM | POA: Diagnosis not present

## 2019-07-14 DIAGNOSIS — M6281 Muscle weakness (generalized): Secondary | ICD-10-CM | POA: Diagnosis not present

## 2019-07-14 DIAGNOSIS — R2689 Other abnormalities of gait and mobility: Secondary | ICD-10-CM | POA: Diagnosis not present

## 2019-07-14 DIAGNOSIS — M25511 Pain in right shoulder: Secondary | ICD-10-CM | POA: Diagnosis not present

## 2019-07-18 DIAGNOSIS — M25511 Pain in right shoulder: Secondary | ICD-10-CM | POA: Diagnosis not present

## 2019-07-18 DIAGNOSIS — M6281 Muscle weakness (generalized): Secondary | ICD-10-CM | POA: Diagnosis not present

## 2019-07-18 DIAGNOSIS — R2689 Other abnormalities of gait and mobility: Secondary | ICD-10-CM | POA: Diagnosis not present

## 2019-07-19 DIAGNOSIS — R2689 Other abnormalities of gait and mobility: Secondary | ICD-10-CM | POA: Diagnosis not present

## 2019-07-19 DIAGNOSIS — M25511 Pain in right shoulder: Secondary | ICD-10-CM | POA: Diagnosis not present

## 2019-07-19 DIAGNOSIS — M6281 Muscle weakness (generalized): Secondary | ICD-10-CM | POA: Diagnosis not present

## 2019-07-21 DIAGNOSIS — R2689 Other abnormalities of gait and mobility: Secondary | ICD-10-CM | POA: Diagnosis not present

## 2019-07-21 DIAGNOSIS — M6281 Muscle weakness (generalized): Secondary | ICD-10-CM | POA: Diagnosis not present

## 2019-07-21 DIAGNOSIS — M25511 Pain in right shoulder: Secondary | ICD-10-CM | POA: Diagnosis not present

## 2019-07-22 DIAGNOSIS — Z20828 Contact with and (suspected) exposure to other viral communicable diseases: Secondary | ICD-10-CM | POA: Diagnosis not present

## 2019-07-26 DIAGNOSIS — M6281 Muscle weakness (generalized): Secondary | ICD-10-CM | POA: Diagnosis not present

## 2019-07-26 DIAGNOSIS — R2689 Other abnormalities of gait and mobility: Secondary | ICD-10-CM | POA: Diagnosis not present

## 2019-07-26 DIAGNOSIS — M25511 Pain in right shoulder: Secondary | ICD-10-CM | POA: Diagnosis not present

## 2019-07-27 DIAGNOSIS — R2689 Other abnormalities of gait and mobility: Secondary | ICD-10-CM | POA: Diagnosis not present

## 2019-07-27 DIAGNOSIS — M6281 Muscle weakness (generalized): Secondary | ICD-10-CM | POA: Diagnosis not present

## 2019-07-27 DIAGNOSIS — M25511 Pain in right shoulder: Secondary | ICD-10-CM | POA: Diagnosis not present

## 2019-07-28 DIAGNOSIS — M6281 Muscle weakness (generalized): Secondary | ICD-10-CM | POA: Diagnosis not present

## 2019-07-28 DIAGNOSIS — M25511 Pain in right shoulder: Secondary | ICD-10-CM | POA: Diagnosis not present

## 2019-07-28 DIAGNOSIS — R2689 Other abnormalities of gait and mobility: Secondary | ICD-10-CM | POA: Diagnosis not present

## 2019-08-02 DIAGNOSIS — M6281 Muscle weakness (generalized): Secondary | ICD-10-CM | POA: Diagnosis not present

## 2019-08-02 DIAGNOSIS — M25511 Pain in right shoulder: Secondary | ICD-10-CM | POA: Diagnosis not present

## 2019-08-02 DIAGNOSIS — R2689 Other abnormalities of gait and mobility: Secondary | ICD-10-CM | POA: Diagnosis not present

## 2019-08-03 DIAGNOSIS — M25511 Pain in right shoulder: Secondary | ICD-10-CM | POA: Diagnosis not present

## 2019-08-03 DIAGNOSIS — R2689 Other abnormalities of gait and mobility: Secondary | ICD-10-CM | POA: Diagnosis not present

## 2019-08-03 DIAGNOSIS — M6281 Muscle weakness (generalized): Secondary | ICD-10-CM | POA: Diagnosis not present

## 2019-08-09 DIAGNOSIS — M6281 Muscle weakness (generalized): Secondary | ICD-10-CM | POA: Diagnosis not present

## 2019-08-09 DIAGNOSIS — R2689 Other abnormalities of gait and mobility: Secondary | ICD-10-CM | POA: Diagnosis not present

## 2019-08-09 DIAGNOSIS — Z992 Dependence on renal dialysis: Secondary | ICD-10-CM | POA: Diagnosis not present

## 2019-08-09 DIAGNOSIS — I739 Peripheral vascular disease, unspecified: Secondary | ICD-10-CM | POA: Diagnosis not present

## 2019-08-09 DIAGNOSIS — M25511 Pain in right shoulder: Secondary | ICD-10-CM | POA: Diagnosis not present

## 2019-08-09 DIAGNOSIS — J449 Chronic obstructive pulmonary disease, unspecified: Secondary | ICD-10-CM | POA: Diagnosis not present

## 2019-08-09 DIAGNOSIS — N186 End stage renal disease: Secondary | ICD-10-CM | POA: Diagnosis not present

## 2019-08-11 DIAGNOSIS — R2689 Other abnormalities of gait and mobility: Secondary | ICD-10-CM | POA: Diagnosis not present

## 2019-08-11 DIAGNOSIS — M6281 Muscle weakness (generalized): Secondary | ICD-10-CM | POA: Diagnosis not present

## 2019-08-11 DIAGNOSIS — M25511 Pain in right shoulder: Secondary | ICD-10-CM | POA: Diagnosis not present

## 2019-08-12 DIAGNOSIS — M6281 Muscle weakness (generalized): Secondary | ICD-10-CM | POA: Diagnosis not present

## 2019-08-12 DIAGNOSIS — M25511 Pain in right shoulder: Secondary | ICD-10-CM | POA: Diagnosis not present

## 2019-08-12 DIAGNOSIS — R2689 Other abnormalities of gait and mobility: Secondary | ICD-10-CM | POA: Diagnosis not present

## 2019-08-15 DIAGNOSIS — R2689 Other abnormalities of gait and mobility: Secondary | ICD-10-CM | POA: Diagnosis not present

## 2019-08-15 DIAGNOSIS — M6281 Muscle weakness (generalized): Secondary | ICD-10-CM | POA: Diagnosis not present

## 2019-08-15 DIAGNOSIS — M25511 Pain in right shoulder: Secondary | ICD-10-CM | POA: Diagnosis not present

## 2019-08-16 DIAGNOSIS — M6281 Muscle weakness (generalized): Secondary | ICD-10-CM | POA: Diagnosis not present

## 2019-08-16 DIAGNOSIS — R2689 Other abnormalities of gait and mobility: Secondary | ICD-10-CM | POA: Diagnosis not present

## 2019-08-16 DIAGNOSIS — M25511 Pain in right shoulder: Secondary | ICD-10-CM | POA: Diagnosis not present

## 2019-08-18 ENCOUNTER — Ambulatory Visit: Payer: Medicare Other | Admitting: Vascular Surgery

## 2019-08-19 DIAGNOSIS — M25511 Pain in right shoulder: Secondary | ICD-10-CM | POA: Diagnosis not present

## 2019-08-19 DIAGNOSIS — M6281 Muscle weakness (generalized): Secondary | ICD-10-CM | POA: Diagnosis not present

## 2019-08-19 DIAGNOSIS — R2689 Other abnormalities of gait and mobility: Secondary | ICD-10-CM | POA: Diagnosis not present

## 2019-08-23 DIAGNOSIS — M6281 Muscle weakness (generalized): Secondary | ICD-10-CM | POA: Diagnosis not present

## 2019-08-23 DIAGNOSIS — R2689 Other abnormalities of gait and mobility: Secondary | ICD-10-CM | POA: Diagnosis not present

## 2019-08-23 DIAGNOSIS — M25511 Pain in right shoulder: Secondary | ICD-10-CM | POA: Diagnosis not present

## 2019-08-25 ENCOUNTER — Ambulatory Visit: Payer: Medicare Other | Admitting: Vascular Surgery

## 2019-08-25 ENCOUNTER — Ambulatory Visit (INDEPENDENT_AMBULATORY_CARE_PROVIDER_SITE_OTHER): Payer: Medicare Other | Admitting: Vascular Surgery

## 2019-08-25 ENCOUNTER — Encounter: Payer: Self-pay | Admitting: Vascular Surgery

## 2019-08-25 ENCOUNTER — Other Ambulatory Visit: Payer: Self-pay

## 2019-08-25 VITALS — BP 131/69 | HR 68 | Temp 97.2°F | Resp 20 | Ht 70.0 in | Wt 113.0 lb

## 2019-08-25 DIAGNOSIS — M25511 Pain in right shoulder: Secondary | ICD-10-CM | POA: Diagnosis not present

## 2019-08-25 DIAGNOSIS — R2689 Other abnormalities of gait and mobility: Secondary | ICD-10-CM | POA: Diagnosis not present

## 2019-08-25 DIAGNOSIS — I739 Peripheral vascular disease, unspecified: Secondary | ICD-10-CM

## 2019-08-25 DIAGNOSIS — M6281 Muscle weakness (generalized): Secondary | ICD-10-CM | POA: Diagnosis not present

## 2019-08-25 NOTE — Progress Notes (Signed)
Patient is a 73 year old male who has previously undergone axillary bifemoral bypass by Dr. Donnetta Hutching in 2018.  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 Scot Dock March 2020.  His above-knee amputation has healed.  He apparently was referred for an asymptomatic right superficial femoral artery occlusion.  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  Patient has some dementia is not able to provide precise history.  However he is able to state that he has not been nonambulatory for multiple months.  He also has some dementia.  He is in a wheelchair.  He resides in a skilled nursing facility.  He has no wounds on his foot.  He does not describe rest pain in the right foot.  I reviewed a arterial duplex study dated July 07, 2019 which showed patent right common femoral artery with occlusion of the right superficial femoral artery.  There was posterior tibial artery runoff to the right foot.  Review of systems: The patient does not have chest pain.  He does not have shortness of breath.  Current Outpatient Medications on File Prior to Visit  Medication Sig Dispense Refill  . aspirin EC 81 MG tablet Take 1 tablet (81 mg total) by mouth daily. 90 tablet 3  . atorvastatin (LIPITOR) 80 MG tablet Take 1 tablet (80 mg total) by mouth at bedtime. 30 tablet 6  . b complex-vitamin c-folic acid (NEPHRO-VITE) 0.8 MG TABS tablet Take 1 tablet by mouth daily.    . bisacodyl (DULCOLAX) 10 MG suppository Place 10 mg rectally daily as needed (constipation).     . cinacalcet (SENSIPAR) 90 MG tablet Take 90 mg by mouth daily with supper.    . clopidogrel (PLAVIX) 75 MG tablet Take 1 tablet (75 mg total) by mouth daily. 30 tablet 2  . docusate sodium (COLACE) 100 MG capsule Take 200 mg by mouth at bedtime.     . gabapentin (NEURONTIN) 300 MG capsule Take 1 capsule (300 mg total) by mouth at bedtime. 30 capsule 0  . Glycopyrrolate 15.6  MCG CAPS Place 15.6 mg into inhaler and inhale 2 (two) times a day.    Vanessa Kick Ethyl (VASCEPA) 1 g CAPS Take 1 g by mouth 2 (two) times daily.     Marland Kitchen ipratropium-albuterol (DUONEB) 0.5-2.5 (3) MG/3ML SOLN Take 3 mLs by nebulization every 8 (eight) hours as needed (for copd).    . metoprolol tartrate (LOPRESSOR) 25 MG tablet Take 0.5 tablets (12.5 mg total) by mouth 2 (two) times daily. 30 tablet 5  . mirtazapine (REMERON) 7.5 MG tablet Take 7.5 mg by mouth at bedtime.    . nitroGLYCERIN (NITROSTAT) 0.4 MG SL tablet Place 1 tablet (0.4 mg total) under the tongue every 5 (five) minutes x 3 doses as needed for chest pain. 25 tablet 12  . Nutritional Supplements (ENSURE PO) Take 237 mLs by mouth 2 (two) times daily.     . Nutritional Supplements (FEEDING SUPPLEMENT, NEPRO CARB STEADY,) LIQD Take 237 mLs by mouth 2 (two) times daily between meals.  0  . Nutritional Supplements (PROMOD) LIQD Take 30 mLs by mouth 2 (two) times a day.    . ondansetron (ZOFRAN) 4 MG tablet Take 4 mg by mouth every 6 (six) hours as needed for nausea or vomiting.    . senna (SENOKOT) 8.6 MG TABS tablet Take 1 tablet by mouth daily.    . sevelamer carbonate (RENVELA) 800 MG tablet Take 1,600  mg by mouth 3 (three) times daily with meals.     Marland Kitchen doxercalciferol (HECTOROL) 4 MCG/2ML injection Inject 1 mL (2 mcg total) into the vein every Monday, Wednesday, and Friday with hemodialysis. (Patient not taking: Reported on 05/09/2019) 2 mL   . isosorbide mononitrate (IMDUR) 30 MG 24 hr tablet Take 1 tablet (30 mg total) by mouth daily. 90 tablet 3   No current facility-administered medications on file prior to visit.     No Known Allergies  Physical exam:  Vitals:   08/25/19 1040  BP: 131/69  Pulse: 68  Resp: 20  Temp: (!) 97.2 F (36.2 C)  SpO2: 100%  Weight: 113 lb (51.3 kg)  Height: 5\' 10"  (1.778 m)    Patient has a 2+ right femoral pulse.  His left above-knee amputation is well-healed.  He has no palpable popliteal  or pedal pulses in the right foot.  Skin: He has no open ulcer or wound in the right foot.  Assessment: Patient with chronic right superficial femoral artery occlusion.  His popliteal and pedal pulses have not been present since at least 2018.  He does not seem to have had any acute change.  He is not really a candidate for further revascularization due to his overall debility.  Fortunately he does not have any wounds on his foot currently.  His axillary femoral bypass is patent by clinical exam with a good femoral pulse.  Plan: Patient will follow-up on an as-needed basis if he develops a wound in his foot.  No intervention necessary for his chronic right superficial femoral artery occlusion.  Ruta Hinds, MD [facility] Office: [phone] Pager: [phone]

## 2019-08-26 DIAGNOSIS — M25511 Pain in right shoulder: Secondary | ICD-10-CM | POA: Diagnosis not present

## 2019-08-26 DIAGNOSIS — M6281 Muscle weakness (generalized): Secondary | ICD-10-CM | POA: Diagnosis not present

## 2019-08-26 DIAGNOSIS — R2689 Other abnormalities of gait and mobility: Secondary | ICD-10-CM | POA: Diagnosis not present

## 2019-08-30 DIAGNOSIS — M25511 Pain in right shoulder: Secondary | ICD-10-CM | POA: Diagnosis not present

## 2019-08-30 DIAGNOSIS — M6281 Muscle weakness (generalized): Secondary | ICD-10-CM | POA: Diagnosis not present

## 2019-08-30 DIAGNOSIS — R2689 Other abnormalities of gait and mobility: Secondary | ICD-10-CM | POA: Diagnosis not present

## 2019-08-31 DIAGNOSIS — R2689 Other abnormalities of gait and mobility: Secondary | ICD-10-CM | POA: Diagnosis not present

## 2019-08-31 DIAGNOSIS — M6281 Muscle weakness (generalized): Secondary | ICD-10-CM | POA: Diagnosis not present

## 2019-08-31 DIAGNOSIS — M25511 Pain in right shoulder: Secondary | ICD-10-CM | POA: Diagnosis not present

## 2019-09-02 DIAGNOSIS — R2689 Other abnormalities of gait and mobility: Secondary | ICD-10-CM | POA: Diagnosis not present

## 2019-09-02 DIAGNOSIS — I70209 Unspecified atherosclerosis of native arteries of extremities, unspecified extremity: Secondary | ICD-10-CM | POA: Diagnosis not present

## 2019-09-02 DIAGNOSIS — I739 Peripheral vascular disease, unspecified: Secondary | ICD-10-CM | POA: Diagnosis not present

## 2019-09-02 DIAGNOSIS — Z992 Dependence on renal dialysis: Secondary | ICD-10-CM | POA: Diagnosis not present

## 2019-09-02 DIAGNOSIS — M25511 Pain in right shoulder: Secondary | ICD-10-CM | POA: Diagnosis not present

## 2019-09-02 DIAGNOSIS — N186 End stage renal disease: Secondary | ICD-10-CM | POA: Diagnosis not present

## 2019-09-02 DIAGNOSIS — M6281 Muscle weakness (generalized): Secondary | ICD-10-CM | POA: Diagnosis not present

## 2019-09-03 DIAGNOSIS — M6281 Muscle weakness (generalized): Secondary | ICD-10-CM | POA: Diagnosis not present

## 2019-09-03 DIAGNOSIS — M25511 Pain in right shoulder: Secondary | ICD-10-CM | POA: Diagnosis not present

## 2019-09-03 DIAGNOSIS — R2689 Other abnormalities of gait and mobility: Secondary | ICD-10-CM | POA: Diagnosis not present

## 2019-09-06 ENCOUNTER — Telehealth: Payer: Self-pay | Admitting: *Deleted

## 2019-09-06 DIAGNOSIS — R2689 Other abnormalities of gait and mobility: Secondary | ICD-10-CM | POA: Diagnosis not present

## 2019-09-06 DIAGNOSIS — M6281 Muscle weakness (generalized): Secondary | ICD-10-CM | POA: Diagnosis not present

## 2019-09-06 DIAGNOSIS — M25511 Pain in right shoulder: Secondary | ICD-10-CM | POA: Diagnosis not present

## 2019-09-06 NOTE — Telephone Encounter (Signed)
Unable to leave a message,mailbox is full. 

## 2019-09-07 DIAGNOSIS — M6281 Muscle weakness (generalized): Secondary | ICD-10-CM | POA: Diagnosis not present

## 2019-09-07 DIAGNOSIS — R2689 Other abnormalities of gait and mobility: Secondary | ICD-10-CM | POA: Diagnosis not present

## 2019-09-07 DIAGNOSIS — M25511 Pain in right shoulder: Secondary | ICD-10-CM | POA: Diagnosis not present

## 2019-09-08 DIAGNOSIS — M25511 Pain in right shoulder: Secondary | ICD-10-CM | POA: Diagnosis not present

## 2019-09-08 DIAGNOSIS — M6281 Muscle weakness (generalized): Secondary | ICD-10-CM | POA: Diagnosis not present

## 2019-09-08 DIAGNOSIS — F329 Major depressive disorder, single episode, unspecified: Secondary | ICD-10-CM | POA: Diagnosis not present

## 2019-09-08 DIAGNOSIS — R2689 Other abnormalities of gait and mobility: Secondary | ICD-10-CM | POA: Diagnosis not present

## 2019-09-08 DIAGNOSIS — J449 Chronic obstructive pulmonary disease, unspecified: Secondary | ICD-10-CM | POA: Diagnosis not present

## 2019-09-08 DIAGNOSIS — E785 Hyperlipidemia, unspecified: Secondary | ICD-10-CM | POA: Diagnosis not present

## 2019-09-09 DIAGNOSIS — R2689 Other abnormalities of gait and mobility: Secondary | ICD-10-CM | POA: Diagnosis not present

## 2019-09-09 DIAGNOSIS — M25511 Pain in right shoulder: Secondary | ICD-10-CM | POA: Diagnosis not present

## 2019-09-09 DIAGNOSIS — M6281 Muscle weakness (generalized): Secondary | ICD-10-CM | POA: Diagnosis not present

## 2019-09-14 NOTE — Progress Notes (Signed)
Virtual Visit via Video Note   This visit type was conducted due to national recommendations for restrictions regarding the COVID-19 Pandemic (e.g. social distancing) in an effort to limit this patient's exposure and mitigate transmission in our community.  Due to his co-morbid illnesses, this patient is at least at moderate risk for complications without adequate follow up.  This format is felt to be most appropriate for this patient at this time.  All issues noted in this document were discussed and addressed.  A limited physical exam was performed with this format.  Please refer to the patient's chart for his consent to telehealth for [facility].   Date:  09/15/2019   ID:  [name], DOB 02-Apr-1946, MRN [id number]  Patient Location: [place] Provider Location: Home  PCP:  Clinic, [facility]  Cardiologist:  Kirk Ruths, MD  Electrophysiologist:  None   Evaluation Performed:  Follow-Up Visit  Chief Complaint:  Follow up  History of Present Illness:    Trevor [name] is a 73 y.o. 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  He is followed by Dr. Oneida Alar, and was not found to be a candidate for further revascularization due to overall disability, he has chronic right superficial femoral artery occlusion.  He was last evaluated by [name], PA, on 03/15/2019.  No testing or medication changes were made at that time.  Trevor [name] is without complaint today.  He continues to be a resident of skilled nursing facility.  He is  compliant with his medication regimen and dialysis.  He denies any symptoms of chest pain, fluid overload, pain in his extremities, dizziness, or worsening fatigue.  He appears to be well cared for.  The patient does not have symptoms concerning for COVID-19 infection (fever, chills, cough, or new shortness of breath).    Past Medical History:  Diagnosis Date  . AAA (abdominal aortic aneurysm) ([place])   . Anemia   . Anxiety   . Arthritis    arms and back  . Blindness   . CHF (congestive heart failure) ([place])   . Coronary artery disease    mild   . 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 Diabetes mellitus without complication ([employer])   . ESRD on hemodialysis ([place])    started HD 2014-15  . Headache(784.0)   . HTN (hypertension)   . Protein-calorie malnutrition ([place])   . STEMI (ST elevation myocardial infarction) ([place]) 11/11/2018  . Stroke [facility]) 01/2018   no residual deficits   Past Surgical History:  Procedure Laterality Date  . A/V FISTULAGRAM N/A 07/29/2018   Procedure: A/V FISTULAGRAM - left upper extremity;  Surgeon: Waynetta Sandy, MD;  Location: [place] CV [facility];  Service: Cardiovascular;  Laterality: N/A;  . AMPUTATION Left 03/01/2019   Procedure: AMPUTATION ABOVE KNEE;  Surgeon: Angelia Mould, MD;  Location: [place];  Service: Vascular;  Laterality: Left;  . AXILLARY-FEMORAL BYPASS GRAFT  06/28/2017   Procedure: BYPASS GRAFT RIGHT AXILLA-BIFEMORAL USING 8MM X 30CM AND 8MM X 60CM HEMASHIELD GOLD GRAFTS;  Surgeon: Rosetta Posner, MD;  Location: [place];  Service: Vascular;;  . CARDIAC CATHETERIZATION N/A 11/01/2016   Procedure: Left Heart Cath and Coronary Angiography;  Surgeon: Lorretta Harp, MD;  Location: [place] CV LAB;  Service: Cardiovascular;  Laterality: N/A;  . ENDARTERECTOMY FEMORAL Bilateral 06/28/2017   Procedure: ENDARTERECTOMY BILATERAL FEMORAL ARTERIES;  Surgeon: Rosetta Posner, MD;  Location: [place];   Service: Vascular;  Laterality: Bilateral;  . FISTULA SUPERFICIALIZATION Left [id number]   Procedure: PLICATION OF ARTERIOVENOUS FISTULA LEFT ARM;  Surgeon: Waynetta Sandy, MD;  Location: [place];  Service: Vascular;  Laterality: Left;  . FISTULOGRAM Left 05/20/2018   Procedure: FISTULOGRAM WITH BALLOON ANGIOPLASTY LEFT ARM ARTERIOVENOUS FISTULA;  Surgeon: Waynetta Sandy, MD;  Location: [place];  Service: Vascular;  Laterality: Left;  . INSERTION OF DIALYSIS CATHETER  09/21/2012   Procedure: INSERTION OF DIALYSIS CATHETER;  Surgeon: Angelia Mould, MD;  Location: [place];  Service: Vascular;  Laterality: N/A;  Right Internal Jugular Placement  . LEFT HEART CATH N/A 11/11/2018   Procedure: LEFT HEART CATH;  Surgeon: Sherren Mocha, MD;  Location: [place] CV LAB;  Service: Cardiovascular;  Laterality: N/A;  . LEFT HEART CATHETERIZATION WITH CORONARY ANGIOGRAM N/A 09/28/2012   Procedure: LEFT HEART CATHETERIZATION WITH CORONARY ANGIOGRAM;  Surgeon: Sherren Mocha, MD;  Location: [facility] CATH LAB;  Service: Cardiovascular;  Laterality: N/A;  . LEFT HEART CATHETERIZATION WITH CORONARY ANGIOGRAM N/A 07/07/2014   Procedure: LEFT HEART CATHETERIZATION WITH CORONARY ANGIOGRAM;  Surgeon: Leonie Man, MD;  Location: [facility] C[facility] LAB;  Service: Cardiovascular;  Laterality: N/A;  . PERIPHERAL VASCULAR BALLOON ANGIOPLASTY Left 07/29/2018   Procedure: PERIPHERAL VASCULAR BALLOON ANGIOPLASTY;  Surgeon: Waynetta Sandy, MD;  Location: [place] CV LAB;  Service: Cardiovascular;  Laterality: Left;  CENTRAL VEIN  . PERIPHERAL VASCULAR INTERVENTION Left 07/29/2018   Procedure: PERIPHERAL VASCULAR INTERVENTION;  Surgeon: Waynetta Sandy, MD;  Location: [place] CV LAB;  Service: Cardiovascular;  Laterality: Left;  AXILLARY VEIN  . REVISION OF ARTERIOVENOUS GORETEX GRAFT Left 12/30/2018   Procedure: REVISION OF ARTERIOVENOUS FISTULA LEFT ARM;  Surgeon: Rosetta Posner, MD;   Location: [place];  Service: Vascular;  Laterality: Left;  . REVISON OF ARTERIOVENOUS FISTULA Left 08/17/2018   Procedure: REVISION OF ARTERIOVENOUS FISTULA LEFT ARM;  Surgeon: Waynetta Sandy, MD;  Location: [place];  Service: Vascular;  Laterality: Left;  . right hand    . TEE WITHOUT CARDIOVERSION  10/09/2011   Procedure: TRANSESOPHAGEAL ECHOCARDIOGRAM (TEE);  Surgeon: Lelon Perla, MD;  Location: [facility] ENDOSCOPY;  Service: Cardiovascular;  Laterality: N/A;     Current Meds  Medication Sig  . aspirin EC 81 MG tablet Take 1 tablet (81 mg total) by mouth daily.  Marland Kitchen atorvastatin (LIPITOR) 80 MG tablet Take 1 tablet (80 mg total) by mouth at bedtime.  Marland Kitchen b complex-vitamin c-folic acid (NEPHRO-VITE) 0.8 MG TABS tablet Take 1 tablet by mouth daily.  . bisacodyl (DULCOLAX) 10 MG suppository Place 10 mg rectally daily as needed (constipation).   . cinacalcet (SENSIPAR) 90 MG tablet Take 90 mg by mouth daily with supper.  . clopidogrel (PLAVIX) 75 MG tablet Take 1 tablet (75 mg total) by mouth daily.  Marland Kitchen docusate sodium (COLACE) 100 MG capsule Take 200 mg by mouth at bedtime.   . gabapentin (NEURONTIN) 300 MG capsule Take 1 capsule (300 mg total) by mouth at bedtime.  . Glycopyrrolate 15.6 MCG CAPS Place 15.6 mg into inhaler and inhale  2 (two) times a day.  Vanessa Kick Ethyl (VASCEPA) 1 g CAPS Take 1 g by mouth 2 (two) times daily.   . isosorbide mononitrate (IMDUR) 30 MG 24 hr tablet Take 1 tablet (30 mg total) by mouth daily.  . metoprolol tartrate (LOPRESSOR) 25 MG tablet Take 0.5 tablets (12.5 mg total) by mouth 2 (two) times daily.  . mirtazapine (REMERON) 15 MG tablet Take 15 mg by mouth at bedtime.   . nitroGLYCERIN (NITROSTAT) 0.4 MG SL tablet Place 1 tablet (0.4 mg total) under the tongue every 5 (five) minutes x 3 doses as needed for chest pain.  . Nutritional Supplements (ENSURE PO) Take 237 mLs by mouth 2 (two) times daily.   . Nutritional Supplements (FEEDING SUPPLEMENT, NEPRO CARB  STEADY,) LIQD Take 237 mLs by mouth 2 (two) times daily between meals.  . Nutritional Supplements (PROMOD) LIQD Take 30 mLs by mouth 2 (two) times a day.  . ondansetron (ZOFRAN) 4 MG tablet Take 4 mg by mouth every 6 (six) hours as needed for nausea or vomiting.  . senna (SENOKOT) 8.6 MG TABS tablet Take 1 tablet by mouth daily.  . sevelamer carbonate (RENVELA) 800 MG tablet Take 1,600 mg by mouth 3 (three) times daily with meals.      Allergies:   Patient has no known allergies.   Social History   Tobacco Use  . Smoking status: Current Every Day Smoker    Packs/day: 0.50    Years: 60.00    Pack years: 30.00    Types: Cigarettes  . Smokeless tobacco: Never Used  . Tobacco comment: 5-6 cigarettes per day  Substance Use Topics  . Alcohol use: No    Alcohol/week: 0.0 standard drinks    Comment: Occasional  . Drug use: No    Frequency: 2.0 times per week    Types: "Crack" cocaine     Family Hx: The patient's family history includes Alcohol abuse in his father; Diabetes in his mother; Heart attack in his mother. There is no history of Anesthesia problems, Hypotension, Malignant hyperthermia, or Pseudochol deficiency.  ROS:   Please see the history of present illness.    All other systems reviewed and are negative.   Prior CV studies:   The following studies were reviewed today:  Echocardiogram 11/12/2018  Left ventricle: The cavity size was normal. Wall thickness was   increased in a pattern of moderate LVH. There was moderate   concentric hypertrophy. Systolic function was mildly reduced. The   estimated ejection fraction was in the range of 45% to 50%.   Diffuse hypokinesis. Doppler parameters are consistent with   abnormal left ventricular relaxation (grade 1 diastolic   dysfunction). - Mitral valve: Calcified annulus. There was no significant   regurgitation. - Left atrium: The atrium was mildly dilated. - Pericardium, extracardiac: A trivial pericardial effusion was    identified posterior to the heart.  Impressions:  - Mildly reduced LV EF, confirmed with 2D tracking. Diffuse   hypokinesis, no regional wall motion abnormalities. Trivial   posterior pericardial effusion. Overall similar to prior.  Labs/Other Tests and Data Reviewed:    EKG:  No ECG reviewed.  Recent Labs: 11/11/2018: TSH 1.331 03/01/2019: ALT 11 03/05/2019: Magnesium 2.0 05/09/2019: B Natriuretic Peptide >4,500.0 05/10/2019: BUN 25; Creatinine, Ser 4.95; Hemoglobin 10.8; Platelets 111; Potassium 3.8; Sodium 138   Recent Lipid Panel Lab Results  Component Value Date/Time   CHOL 109 11/11/2018 06:15 AM   TRIG 108 11/11/2018 06:15 AM  HDL 32 (L) 11/11/2018 06:15 AM   CHOLHDL 3.4 11/11/2018 06:15 AM   LDLCALC 55 11/11/2018 06:15 AM    Wt Readings from Last 3 Encounters:  09/15/19 110 lb 6.4 oz (50.1 kg)  08/25/19 113 lb (51.3 kg)  05/09/19 113 lb 1.5 oz (51.3 kg)     Objective:    Vital Signs:  BP 130/75   Pulse 78   Ht 5\' 10"  (1.778 m)   Wt 110 lb 6.4 oz (50.1 kg)   BMI 15.84 kg/m    VITAL SIGNS:  reviewed GEN:  no acute distress RESPIRATORY:  normal respiratory effort, symmetric expansion NEURO:  alert and oriented x 3, no obvious focal deficit PSYCH:  normal affect  ASSESSMENT & PLAN:    1. Coronary artery disease: Unable to quantify as the patient had difficult access and catheterization was not able to be completed.  He is treated medically.  He denies any chest discomfort or worsening shortness of breath.  He is very sedentary.  He does not walk or exercise at all.  He is in a skilled nursing facility and his nurse did not express any concerns or needs at this time.  2.  Chronic systolic dysfunction: No evidence on limited view of video to ascertain volume overload.  Volume status is maintained through dialysis.  He did not appear to be short of breath or edematous.  3.  History of right AKA: Occurring after gangrenous foot.  He does not have a prosthesis.   He is sometimes up in a wheelchair but does not exercise or exert himself.  4.  End-stage renal disease: Continue dialysis as scheduled.  COVID-19 Education: The signs and symptoms of COVID-19 were discussed with the patient and how to seek care for testing (follow up with PCP or arrange E-visit).  The importance of social distancing was discussed today.  Time:   Today, I have spent 15 minutes with the patient with telehealth technology discussing the above problems.     Medication Adjustments/Labs and Tests Ordered: Current medicines are reviewed at length with the patient today.  Concerns regarding medicines are outlined above.   Tests Ordered: No orders of the defined types were placed in this encounter.   Medication Changes: No orders of the defined types were placed in this encounter.   Disposition:  Follow up 6 months  Signed, Phill Myron. [place] Pugh, ANP, AACC  09/15/2019 10:35 AM    [facility]

## 2019-09-15 ENCOUNTER — Telehealth (INDEPENDENT_AMBULATORY_CARE_PROVIDER_SITE_OTHER): Payer: Medicare Other | Admitting: Adult Health

## 2019-09-15 ENCOUNTER — Encounter: Payer: Self-pay | Admitting: Adult Health

## 2019-09-15 VITALS — BP 130/75 | HR 78 | Ht 70.0 in | Wt 110.4 lb

## 2019-09-15 DIAGNOSIS — I739 Peripheral vascular disease, unspecified: Secondary | ICD-10-CM

## 2019-09-15 DIAGNOSIS — I2510 Atherosclerotic heart disease of native coronary artery without angina pectoris: Secondary | ICD-10-CM

## 2019-09-15 DIAGNOSIS — I43 Cardiomyopathy in diseases classified elsewhere: Secondary | ICD-10-CM

## 2019-09-15 DIAGNOSIS — N186 End stage renal disease: Secondary | ICD-10-CM

## 2019-09-15 DIAGNOSIS — I5042 Chronic combined systolic (congestive) and diastolic (congestive) heart failure: Secondary | ICD-10-CM

## 2019-09-15 NOTE — Patient Instructions (Signed)
Medication Instructions:  Continue current medications  If you need a refill on your cardiac medications before your next appointment, please call your pharmacy.  Labwork: None Ordered   Testing/Procedures: None Ordered  Follow-Up: . Kirk Ruths, MD in 6 Months     At [facility], you and your health needs are our priority.  As part of our continuing mission to provide you with exceptional heart care, we have created designated Provider Care Teams.  These Care Teams include your primary Cardiologist (physician) and Advanced Practice Providers (APPs -  Physician Assistants and Nurse Practitioners) who all work together to provide you with the care you need, when you need it.  Thank you for choosing [facility] at [facility]!!

## 2019-09-18 DIAGNOSIS — Z20828 Contact with and (suspected) exposure to other viral communicable diseases: Secondary | ICD-10-CM | POA: Diagnosis not present

## 2019-09-22 DIAGNOSIS — Z20828 Contact with and (suspected) exposure to other viral communicable diseases: Secondary | ICD-10-CM | POA: Diagnosis not present

## 2019-09-29 DIAGNOSIS — Z20828 Contact with and (suspected) exposure to other viral communicable diseases: Secondary | ICD-10-CM | POA: Diagnosis not present

## 2019-10-06 DIAGNOSIS — Z20828 Contact with and (suspected) exposure to other viral communicable diseases: Secondary | ICD-10-CM | POA: Diagnosis not present

## 2019-10-07 DIAGNOSIS — I679 Cerebrovascular disease, unspecified: Secondary | ICD-10-CM | POA: Diagnosis not present

## 2019-10-07 DIAGNOSIS — Z992 Dependence on renal dialysis: Secondary | ICD-10-CM | POA: Diagnosis not present

## 2019-10-07 DIAGNOSIS — N186 End stage renal disease: Secondary | ICD-10-CM | POA: Diagnosis not present

## 2019-10-07 DIAGNOSIS — J449 Chronic obstructive pulmonary disease, unspecified: Secondary | ICD-10-CM | POA: Diagnosis not present

## 2019-10-13 DIAGNOSIS — Z20828 Contact with and (suspected) exposure to other viral communicable diseases: Secondary | ICD-10-CM | POA: Diagnosis not present

## 2019-10-20 DIAGNOSIS — Z20828 Contact with and (suspected) exposure to other viral communicable diseases: Secondary | ICD-10-CM | POA: Diagnosis not present

## 2019-10-20 DIAGNOSIS — R52 Pain, unspecified: Secondary | ICD-10-CM | POA: Diagnosis not present

## 2019-10-20 DIAGNOSIS — K5900 Constipation, unspecified: Secondary | ICD-10-CM | POA: Diagnosis not present

## 2019-10-20 DIAGNOSIS — I679 Cerebrovascular disease, unspecified: Secondary | ICD-10-CM | POA: Diagnosis not present

## 2019-10-22 DIAGNOSIS — I10 Essential (primary) hypertension: Secondary | ICD-10-CM | POA: Diagnosis not present

## 2019-10-22 DIAGNOSIS — Z20828 Contact with and (suspected) exposure to other viral communicable diseases: Secondary | ICD-10-CM | POA: Diagnosis not present

## 2019-10-22 DIAGNOSIS — Z79899 Other long term (current) drug therapy: Secondary | ICD-10-CM | POA: Diagnosis not present

## 2019-10-22 DIAGNOSIS — D649 Anemia, unspecified: Secondary | ICD-10-CM | POA: Diagnosis not present

## 2019-10-24 DIAGNOSIS — E7800 Pure hypercholesterolemia, unspecified: Secondary | ICD-10-CM | POA: Diagnosis not present

## 2019-10-24 DIAGNOSIS — N186 End stage renal disease: Secondary | ICD-10-CM | POA: Diagnosis not present

## 2019-10-24 DIAGNOSIS — U071 COVID-19: Secondary | ICD-10-CM | POA: Diagnosis not present

## 2019-10-24 DIAGNOSIS — J449 Chronic obstructive pulmonary disease, unspecified: Secondary | ICD-10-CM | POA: Diagnosis not present

## 2019-10-24 DIAGNOSIS — Z79899 Other long term (current) drug therapy: Secondary | ICD-10-CM | POA: Diagnosis not present

## 2019-10-24 DIAGNOSIS — Z992 Dependence on renal dialysis: Secondary | ICD-10-CM | POA: Diagnosis not present

## 2019-10-24 DIAGNOSIS — Z20828 Contact with and (suspected) exposure to other viral communicable diseases: Secondary | ICD-10-CM | POA: Diagnosis not present

## 2019-10-30 DIAGNOSIS — U071 COVID-19: Secondary | ICD-10-CM | POA: Diagnosis not present

## 2019-10-30 DIAGNOSIS — D649 Anemia, unspecified: Secondary | ICD-10-CM | POA: Diagnosis not present

## 2019-10-31 DIAGNOSIS — N186 End stage renal disease: Secondary | ICD-10-CM | POA: Diagnosis not present

## 2019-10-31 DIAGNOSIS — J449 Chronic obstructive pulmonary disease, unspecified: Secondary | ICD-10-CM | POA: Diagnosis not present

## 2019-10-31 DIAGNOSIS — Z992 Dependence on renal dialysis: Secondary | ICD-10-CM | POA: Diagnosis not present

## 2019-10-31 DIAGNOSIS — U071 COVID-19: Secondary | ICD-10-CM | POA: Diagnosis not present

## 2019-11-03 DIAGNOSIS — Z741 Need for assistance with personal care: Secondary | ICD-10-CM | POA: Diagnosis not present

## 2019-11-03 DIAGNOSIS — E1151 Type 2 diabetes mellitus with diabetic peripheral angiopathy without gangrene: Secondary | ICD-10-CM | POA: Diagnosis not present

## 2019-11-03 DIAGNOSIS — M6281 Muscle weakness (generalized): Secondary | ICD-10-CM | POA: Diagnosis not present

## 2019-11-03 DIAGNOSIS — Z89612 Acquired absence of left leg above knee: Secondary | ICD-10-CM | POA: Diagnosis not present

## 2019-11-03 DIAGNOSIS — R2689 Other abnormalities of gait and mobility: Secondary | ICD-10-CM | POA: Diagnosis not present

## 2019-11-03 DIAGNOSIS — J449 Chronic obstructive pulmonary disease, unspecified: Secondary | ICD-10-CM | POA: Diagnosis not present

## 2019-11-03 DIAGNOSIS — U071 COVID-19: Secondary | ICD-10-CM | POA: Diagnosis not present

## 2019-11-04 DIAGNOSIS — R2689 Other abnormalities of gait and mobility: Secondary | ICD-10-CM | POA: Diagnosis not present

## 2019-11-04 DIAGNOSIS — E1151 Type 2 diabetes mellitus with diabetic peripheral angiopathy without gangrene: Secondary | ICD-10-CM | POA: Diagnosis not present

## 2019-11-04 DIAGNOSIS — E119 Type 2 diabetes mellitus without complications: Secondary | ICD-10-CM | POA: Diagnosis not present

## 2019-11-04 DIAGNOSIS — B351 Tinea unguium: Secondary | ICD-10-CM | POA: Diagnosis not present

## 2019-11-04 DIAGNOSIS — Z8619 Personal history of other infectious and parasitic diseases: Secondary | ICD-10-CM | POA: Diagnosis not present

## 2019-11-04 DIAGNOSIS — Q845 Enlarged and hypertrophic nails: Secondary | ICD-10-CM | POA: Diagnosis not present

## 2019-11-04 DIAGNOSIS — M6281 Muscle weakness (generalized): Secondary | ICD-10-CM | POA: Diagnosis not present

## 2019-11-04 DIAGNOSIS — U071 COVID-19: Secondary | ICD-10-CM | POA: Diagnosis not present

## 2019-11-04 DIAGNOSIS — I739 Peripheral vascular disease, unspecified: Secondary | ICD-10-CM | POA: Diagnosis not present

## 2019-11-04 DIAGNOSIS — J449 Chronic obstructive pulmonary disease, unspecified: Secondary | ICD-10-CM | POA: Diagnosis not present

## 2019-11-04 DIAGNOSIS — Z741 Need for assistance with personal care: Secondary | ICD-10-CM | POA: Diagnosis not present

## 2019-11-04 DIAGNOSIS — R296 Repeated falls: Secondary | ICD-10-CM | POA: Diagnosis not present

## 2019-11-07 DIAGNOSIS — U071 COVID-19: Secondary | ICD-10-CM | POA: Diagnosis not present

## 2019-11-07 DIAGNOSIS — E1151 Type 2 diabetes mellitus with diabetic peripheral angiopathy without gangrene: Secondary | ICD-10-CM | POA: Diagnosis not present

## 2019-11-07 DIAGNOSIS — Z741 Need for assistance with personal care: Secondary | ICD-10-CM | POA: Diagnosis not present

## 2019-11-07 DIAGNOSIS — J449 Chronic obstructive pulmonary disease, unspecified: Secondary | ICD-10-CM | POA: Diagnosis not present

## 2019-11-07 DIAGNOSIS — M6281 Muscle weakness (generalized): Secondary | ICD-10-CM | POA: Diagnosis not present

## 2019-11-07 DIAGNOSIS — R2689 Other abnormalities of gait and mobility: Secondary | ICD-10-CM | POA: Diagnosis not present

## 2019-11-08 DIAGNOSIS — Z741 Need for assistance with personal care: Secondary | ICD-10-CM | POA: Diagnosis not present

## 2019-11-08 DIAGNOSIS — E1151 Type 2 diabetes mellitus with diabetic peripheral angiopathy without gangrene: Secondary | ICD-10-CM | POA: Diagnosis not present

## 2019-11-08 DIAGNOSIS — R2689 Other abnormalities of gait and mobility: Secondary | ICD-10-CM | POA: Diagnosis not present

## 2019-11-08 DIAGNOSIS — M6281 Muscle weakness (generalized): Secondary | ICD-10-CM | POA: Diagnosis not present

## 2019-11-08 DIAGNOSIS — U071 COVID-19: Secondary | ICD-10-CM | POA: Diagnosis not present

## 2019-11-08 DIAGNOSIS — J449 Chronic obstructive pulmonary disease, unspecified: Secondary | ICD-10-CM | POA: Diagnosis not present

## 2019-11-09 DIAGNOSIS — U071 COVID-19: Secondary | ICD-10-CM | POA: Diagnosis not present

## 2019-11-09 DIAGNOSIS — R2689 Other abnormalities of gait and mobility: Secondary | ICD-10-CM | POA: Diagnosis not present

## 2019-11-09 DIAGNOSIS — Z741 Need for assistance with personal care: Secondary | ICD-10-CM | POA: Diagnosis not present

## 2019-11-09 DIAGNOSIS — E1151 Type 2 diabetes mellitus with diabetic peripheral angiopathy without gangrene: Secondary | ICD-10-CM | POA: Diagnosis not present

## 2019-11-09 DIAGNOSIS — J449 Chronic obstructive pulmonary disease, unspecified: Secondary | ICD-10-CM | POA: Diagnosis not present

## 2019-11-09 DIAGNOSIS — M6281 Muscle weakness (generalized): Secondary | ICD-10-CM | POA: Diagnosis not present

## 2019-11-11 DIAGNOSIS — Z741 Need for assistance with personal care: Secondary | ICD-10-CM | POA: Diagnosis not present

## 2019-11-11 DIAGNOSIS — J449 Chronic obstructive pulmonary disease, unspecified: Secondary | ICD-10-CM | POA: Diagnosis not present

## 2019-11-11 DIAGNOSIS — U071 COVID-19: Secondary | ICD-10-CM | POA: Diagnosis not present

## 2019-11-11 DIAGNOSIS — E1151 Type 2 diabetes mellitus with diabetic peripheral angiopathy without gangrene: Secondary | ICD-10-CM | POA: Diagnosis not present

## 2019-11-11 DIAGNOSIS — M6281 Muscle weakness (generalized): Secondary | ICD-10-CM | POA: Diagnosis not present

## 2019-11-11 DIAGNOSIS — R2689 Other abnormalities of gait and mobility: Secondary | ICD-10-CM | POA: Diagnosis not present

## 2019-11-14 DIAGNOSIS — E1151 Type 2 diabetes mellitus with diabetic peripheral angiopathy without gangrene: Secondary | ICD-10-CM | POA: Diagnosis not present

## 2019-11-14 DIAGNOSIS — Z741 Need for assistance with personal care: Secondary | ICD-10-CM | POA: Diagnosis not present

## 2019-11-14 DIAGNOSIS — R2689 Other abnormalities of gait and mobility: Secondary | ICD-10-CM | POA: Diagnosis not present

## 2019-11-14 DIAGNOSIS — M6281 Muscle weakness (generalized): Secondary | ICD-10-CM | POA: Diagnosis not present

## 2019-11-14 DIAGNOSIS — J449 Chronic obstructive pulmonary disease, unspecified: Secondary | ICD-10-CM | POA: Diagnosis not present

## 2019-11-14 DIAGNOSIS — U071 COVID-19: Secondary | ICD-10-CM | POA: Diagnosis not present

## 2019-11-15 DIAGNOSIS — Z741 Need for assistance with personal care: Secondary | ICD-10-CM | POA: Diagnosis not present

## 2019-11-15 DIAGNOSIS — R2689 Other abnormalities of gait and mobility: Secondary | ICD-10-CM | POA: Diagnosis not present

## 2019-11-15 DIAGNOSIS — U071 COVID-19: Secondary | ICD-10-CM | POA: Diagnosis not present

## 2019-11-15 DIAGNOSIS — E1151 Type 2 diabetes mellitus with diabetic peripheral angiopathy without gangrene: Secondary | ICD-10-CM | POA: Diagnosis not present

## 2019-11-15 DIAGNOSIS — J449 Chronic obstructive pulmonary disease, unspecified: Secondary | ICD-10-CM | POA: Diagnosis not present

## 2019-11-15 DIAGNOSIS — M6281 Muscle weakness (generalized): Secondary | ICD-10-CM | POA: Diagnosis not present

## 2019-11-16 DIAGNOSIS — U071 COVID-19: Secondary | ICD-10-CM | POA: Diagnosis not present

## 2019-11-16 DIAGNOSIS — Z741 Need for assistance with personal care: Secondary | ICD-10-CM | POA: Diagnosis not present

## 2019-11-16 DIAGNOSIS — J449 Chronic obstructive pulmonary disease, unspecified: Secondary | ICD-10-CM | POA: Diagnosis not present

## 2019-11-16 DIAGNOSIS — M6281 Muscle weakness (generalized): Secondary | ICD-10-CM | POA: Diagnosis not present

## 2019-11-16 DIAGNOSIS — E1151 Type 2 diabetes mellitus with diabetic peripheral angiopathy without gangrene: Secondary | ICD-10-CM | POA: Diagnosis not present

## 2019-11-16 DIAGNOSIS — R2689 Other abnormalities of gait and mobility: Secondary | ICD-10-CM | POA: Diagnosis not present

## 2019-11-17 DIAGNOSIS — Z741 Need for assistance with personal care: Secondary | ICD-10-CM | POA: Diagnosis not present

## 2019-11-17 DIAGNOSIS — M6281 Muscle weakness (generalized): Secondary | ICD-10-CM | POA: Diagnosis not present

## 2019-11-17 DIAGNOSIS — E1151 Type 2 diabetes mellitus with diabetic peripheral angiopathy without gangrene: Secondary | ICD-10-CM | POA: Diagnosis not present

## 2019-11-17 DIAGNOSIS — J449 Chronic obstructive pulmonary disease, unspecified: Secondary | ICD-10-CM | POA: Diagnosis not present

## 2019-11-17 DIAGNOSIS — R2689 Other abnormalities of gait and mobility: Secondary | ICD-10-CM | POA: Diagnosis not present

## 2019-11-17 DIAGNOSIS — U071 COVID-19: Secondary | ICD-10-CM | POA: Diagnosis not present

## 2019-11-18 DIAGNOSIS — E1151 Type 2 diabetes mellitus with diabetic peripheral angiopathy without gangrene: Secondary | ICD-10-CM | POA: Diagnosis not present

## 2019-11-18 DIAGNOSIS — Z741 Need for assistance with personal care: Secondary | ICD-10-CM | POA: Diagnosis not present

## 2019-11-18 DIAGNOSIS — J449 Chronic obstructive pulmonary disease, unspecified: Secondary | ICD-10-CM | POA: Diagnosis not present

## 2019-11-18 DIAGNOSIS — U071 COVID-19: Secondary | ICD-10-CM | POA: Diagnosis not present

## 2019-11-18 DIAGNOSIS — M6281 Muscle weakness (generalized): Secondary | ICD-10-CM | POA: Diagnosis not present

## 2019-11-18 DIAGNOSIS — R2689 Other abnormalities of gait and mobility: Secondary | ICD-10-CM | POA: Diagnosis not present

## 2019-11-19 DIAGNOSIS — J449 Chronic obstructive pulmonary disease, unspecified: Secondary | ICD-10-CM | POA: Diagnosis not present

## 2019-11-19 DIAGNOSIS — R2689 Other abnormalities of gait and mobility: Secondary | ICD-10-CM | POA: Diagnosis not present

## 2019-11-19 DIAGNOSIS — U071 COVID-19: Secondary | ICD-10-CM | POA: Diagnosis not present

## 2019-11-19 DIAGNOSIS — Z741 Need for assistance with personal care: Secondary | ICD-10-CM | POA: Diagnosis not present

## 2019-11-19 DIAGNOSIS — M6281 Muscle weakness (generalized): Secondary | ICD-10-CM | POA: Diagnosis not present

## 2019-11-19 DIAGNOSIS — E1151 Type 2 diabetes mellitus with diabetic peripheral angiopathy without gangrene: Secondary | ICD-10-CM | POA: Diagnosis not present

## 2019-11-21 DIAGNOSIS — E1151 Type 2 diabetes mellitus with diabetic peripheral angiopathy without gangrene: Secondary | ICD-10-CM | POA: Diagnosis not present

## 2019-11-21 DIAGNOSIS — M6281 Muscle weakness (generalized): Secondary | ICD-10-CM | POA: Diagnosis not present

## 2019-11-21 DIAGNOSIS — R2689 Other abnormalities of gait and mobility: Secondary | ICD-10-CM | POA: Diagnosis not present

## 2019-11-21 DIAGNOSIS — J449 Chronic obstructive pulmonary disease, unspecified: Secondary | ICD-10-CM | POA: Diagnosis not present

## 2019-11-21 DIAGNOSIS — Z741 Need for assistance with personal care: Secondary | ICD-10-CM | POA: Diagnosis not present

## 2019-11-21 DIAGNOSIS — U071 COVID-19: Secondary | ICD-10-CM | POA: Diagnosis not present

## 2019-11-22 DIAGNOSIS — Z741 Need for assistance with personal care: Secondary | ICD-10-CM | POA: Diagnosis not present

## 2019-11-22 DIAGNOSIS — U071 COVID-19: Secondary | ICD-10-CM | POA: Diagnosis not present

## 2019-11-22 DIAGNOSIS — E1151 Type 2 diabetes mellitus with diabetic peripheral angiopathy without gangrene: Secondary | ICD-10-CM | POA: Diagnosis not present

## 2019-11-22 DIAGNOSIS — J449 Chronic obstructive pulmonary disease, unspecified: Secondary | ICD-10-CM | POA: Diagnosis not present

## 2019-11-22 DIAGNOSIS — R2689 Other abnormalities of gait and mobility: Secondary | ICD-10-CM | POA: Diagnosis not present

## 2019-11-22 DIAGNOSIS — M6281 Muscle weakness (generalized): Secondary | ICD-10-CM | POA: Diagnosis not present

## 2019-11-23 DIAGNOSIS — E1151 Type 2 diabetes mellitus with diabetic peripheral angiopathy without gangrene: Secondary | ICD-10-CM | POA: Diagnosis not present

## 2019-11-23 DIAGNOSIS — Z741 Need for assistance with personal care: Secondary | ICD-10-CM | POA: Diagnosis not present

## 2019-11-23 DIAGNOSIS — U071 COVID-19: Secondary | ICD-10-CM | POA: Diagnosis not present

## 2019-11-23 DIAGNOSIS — R2689 Other abnormalities of gait and mobility: Secondary | ICD-10-CM | POA: Diagnosis not present

## 2019-11-23 DIAGNOSIS — M6281 Muscle weakness (generalized): Secondary | ICD-10-CM | POA: Diagnosis not present

## 2019-11-23 DIAGNOSIS — J449 Chronic obstructive pulmonary disease, unspecified: Secondary | ICD-10-CM | POA: Diagnosis not present

## 2019-11-27 DIAGNOSIS — J449 Chronic obstructive pulmonary disease, unspecified: Secondary | ICD-10-CM | POA: Diagnosis not present

## 2019-11-27 DIAGNOSIS — E1151 Type 2 diabetes mellitus with diabetic peripheral angiopathy without gangrene: Secondary | ICD-10-CM | POA: Diagnosis not present

## 2019-11-27 DIAGNOSIS — Z741 Need for assistance with personal care: Secondary | ICD-10-CM | POA: Diagnosis not present

## 2019-11-27 DIAGNOSIS — M6281 Muscle weakness (generalized): Secondary | ICD-10-CM | POA: Diagnosis not present

## 2019-11-27 DIAGNOSIS — R2689 Other abnormalities of gait and mobility: Secondary | ICD-10-CM | POA: Diagnosis not present

## 2019-11-27 DIAGNOSIS — U071 COVID-19: Secondary | ICD-10-CM | POA: Diagnosis not present

## 2019-11-28 DIAGNOSIS — Z741 Need for assistance with personal care: Secondary | ICD-10-CM | POA: Diagnosis not present

## 2019-11-28 DIAGNOSIS — U071 COVID-19: Secondary | ICD-10-CM | POA: Diagnosis not present

## 2019-11-28 DIAGNOSIS — M6281 Muscle weakness (generalized): Secondary | ICD-10-CM | POA: Diagnosis not present

## 2019-11-28 DIAGNOSIS — E1151 Type 2 diabetes mellitus with diabetic peripheral angiopathy without gangrene: Secondary | ICD-10-CM | POA: Diagnosis not present

## 2019-11-28 DIAGNOSIS — R2689 Other abnormalities of gait and mobility: Secondary | ICD-10-CM | POA: Diagnosis not present

## 2019-11-28 DIAGNOSIS — J449 Chronic obstructive pulmonary disease, unspecified: Secondary | ICD-10-CM | POA: Diagnosis not present

## 2019-11-29 DIAGNOSIS — U071 COVID-19: Secondary | ICD-10-CM | POA: Diagnosis not present

## 2019-11-29 DIAGNOSIS — J449 Chronic obstructive pulmonary disease, unspecified: Secondary | ICD-10-CM | POA: Diagnosis not present

## 2019-11-29 DIAGNOSIS — R2689 Other abnormalities of gait and mobility: Secondary | ICD-10-CM | POA: Diagnosis not present

## 2019-11-29 DIAGNOSIS — M6281 Muscle weakness (generalized): Secondary | ICD-10-CM | POA: Diagnosis not present

## 2019-11-29 DIAGNOSIS — E1151 Type 2 diabetes mellitus with diabetic peripheral angiopathy without gangrene: Secondary | ICD-10-CM | POA: Diagnosis not present

## 2019-11-29 DIAGNOSIS — Z741 Need for assistance with personal care: Secondary | ICD-10-CM | POA: Diagnosis not present

## 2019-11-30 DIAGNOSIS — J449 Chronic obstructive pulmonary disease, unspecified: Secondary | ICD-10-CM | POA: Diagnosis not present

## 2019-11-30 DIAGNOSIS — E1151 Type 2 diabetes mellitus with diabetic peripheral angiopathy without gangrene: Secondary | ICD-10-CM | POA: Diagnosis not present

## 2019-11-30 DIAGNOSIS — M6281 Muscle weakness (generalized): Secondary | ICD-10-CM | POA: Diagnosis not present

## 2019-11-30 DIAGNOSIS — U071 COVID-19: Secondary | ICD-10-CM | POA: Diagnosis not present

## 2019-11-30 DIAGNOSIS — R2689 Other abnormalities of gait and mobility: Secondary | ICD-10-CM | POA: Diagnosis not present

## 2019-11-30 DIAGNOSIS — Z741 Need for assistance with personal care: Secondary | ICD-10-CM | POA: Diagnosis not present

## 2019-12-01 DIAGNOSIS — E1151 Type 2 diabetes mellitus with diabetic peripheral angiopathy without gangrene: Secondary | ICD-10-CM | POA: Diagnosis not present

## 2019-12-01 DIAGNOSIS — U071 COVID-19: Secondary | ICD-10-CM | POA: Diagnosis not present

## 2019-12-01 DIAGNOSIS — J449 Chronic obstructive pulmonary disease, unspecified: Secondary | ICD-10-CM | POA: Diagnosis not present

## 2019-12-01 DIAGNOSIS — Z741 Need for assistance with personal care: Secondary | ICD-10-CM | POA: Diagnosis not present

## 2019-12-01 DIAGNOSIS — R2689 Other abnormalities of gait and mobility: Secondary | ICD-10-CM | POA: Diagnosis not present

## 2019-12-01 DIAGNOSIS — M6281 Muscle weakness (generalized): Secondary | ICD-10-CM | POA: Diagnosis not present

## 2019-12-02 DIAGNOSIS — Z741 Need for assistance with personal care: Secondary | ICD-10-CM | POA: Diagnosis not present

## 2019-12-02 DIAGNOSIS — J449 Chronic obstructive pulmonary disease, unspecified: Secondary | ICD-10-CM | POA: Diagnosis not present

## 2019-12-02 DIAGNOSIS — E1151 Type 2 diabetes mellitus with diabetic peripheral angiopathy without gangrene: Secondary | ICD-10-CM | POA: Diagnosis not present

## 2019-12-02 DIAGNOSIS — Z89612 Acquired absence of left leg above knee: Secondary | ICD-10-CM | POA: Diagnosis not present

## 2019-12-02 DIAGNOSIS — M6281 Muscle weakness (generalized): Secondary | ICD-10-CM | POA: Diagnosis not present

## 2019-12-02 DIAGNOSIS — R2689 Other abnormalities of gait and mobility: Secondary | ICD-10-CM | POA: Diagnosis not present

## 2019-12-02 DIAGNOSIS — U071 COVID-19: Secondary | ICD-10-CM | POA: Diagnosis not present

## 2019-12-04 DIAGNOSIS — R2689 Other abnormalities of gait and mobility: Secondary | ICD-10-CM | POA: Diagnosis not present

## 2019-12-04 DIAGNOSIS — M6281 Muscle weakness (generalized): Secondary | ICD-10-CM | POA: Diagnosis not present

## 2019-12-04 DIAGNOSIS — U071 COVID-19: Secondary | ICD-10-CM | POA: Diagnosis not present

## 2019-12-04 DIAGNOSIS — J449 Chronic obstructive pulmonary disease, unspecified: Secondary | ICD-10-CM | POA: Diagnosis not present

## 2019-12-04 DIAGNOSIS — Z741 Need for assistance with personal care: Secondary | ICD-10-CM | POA: Diagnosis not present

## 2019-12-04 DIAGNOSIS — E1151 Type 2 diabetes mellitus with diabetic peripheral angiopathy without gangrene: Secondary | ICD-10-CM | POA: Diagnosis not present

## 2019-12-06 DIAGNOSIS — R2689 Other abnormalities of gait and mobility: Secondary | ICD-10-CM | POA: Diagnosis not present

## 2019-12-06 DIAGNOSIS — J449 Chronic obstructive pulmonary disease, unspecified: Secondary | ICD-10-CM | POA: Diagnosis not present

## 2019-12-06 DIAGNOSIS — M6281 Muscle weakness (generalized): Secondary | ICD-10-CM | POA: Diagnosis not present

## 2019-12-06 DIAGNOSIS — E1151 Type 2 diabetes mellitus with diabetic peripheral angiopathy without gangrene: Secondary | ICD-10-CM | POA: Diagnosis not present

## 2019-12-06 DIAGNOSIS — U071 COVID-19: Secondary | ICD-10-CM | POA: Diagnosis not present

## 2019-12-06 DIAGNOSIS — Z741 Need for assistance with personal care: Secondary | ICD-10-CM | POA: Diagnosis not present

## 2019-12-07 DIAGNOSIS — M6281 Muscle weakness (generalized): Secondary | ICD-10-CM | POA: Diagnosis not present

## 2019-12-07 DIAGNOSIS — Z23 Encounter for immunization: Secondary | ICD-10-CM | POA: Diagnosis not present

## 2019-12-07 DIAGNOSIS — R2689 Other abnormalities of gait and mobility: Secondary | ICD-10-CM | POA: Diagnosis not present

## 2019-12-07 DIAGNOSIS — U071 COVID-19: Secondary | ICD-10-CM | POA: Diagnosis not present

## 2019-12-07 DIAGNOSIS — E1151 Type 2 diabetes mellitus with diabetic peripheral angiopathy without gangrene: Secondary | ICD-10-CM | POA: Diagnosis not present

## 2019-12-07 DIAGNOSIS — J449 Chronic obstructive pulmonary disease, unspecified: Secondary | ICD-10-CM | POA: Diagnosis not present

## 2019-12-07 DIAGNOSIS — Z741 Need for assistance with personal care: Secondary | ICD-10-CM | POA: Diagnosis not present

## 2019-12-08 DIAGNOSIS — R2689 Other abnormalities of gait and mobility: Secondary | ICD-10-CM | POA: Diagnosis not present

## 2019-12-08 DIAGNOSIS — Z741 Need for assistance with personal care: Secondary | ICD-10-CM | POA: Diagnosis not present

## 2019-12-08 DIAGNOSIS — M6281 Muscle weakness (generalized): Secondary | ICD-10-CM | POA: Diagnosis not present

## 2019-12-08 DIAGNOSIS — E1151 Type 2 diabetes mellitus with diabetic peripheral angiopathy without gangrene: Secondary | ICD-10-CM | POA: Diagnosis not present

## 2019-12-08 DIAGNOSIS — U071 COVID-19: Secondary | ICD-10-CM | POA: Diagnosis not present

## 2019-12-08 DIAGNOSIS — J449 Chronic obstructive pulmonary disease, unspecified: Secondary | ICD-10-CM | POA: Diagnosis not present

## 2019-12-09 DIAGNOSIS — M6281 Muscle weakness (generalized): Secondary | ICD-10-CM | POA: Diagnosis not present

## 2019-12-09 DIAGNOSIS — J449 Chronic obstructive pulmonary disease, unspecified: Secondary | ICD-10-CM | POA: Diagnosis not present

## 2019-12-09 DIAGNOSIS — Z741 Need for assistance with personal care: Secondary | ICD-10-CM | POA: Diagnosis not present

## 2019-12-09 DIAGNOSIS — R2689 Other abnormalities of gait and mobility: Secondary | ICD-10-CM | POA: Diagnosis not present

## 2019-12-09 DIAGNOSIS — U071 COVID-19: Secondary | ICD-10-CM | POA: Diagnosis not present

## 2019-12-09 DIAGNOSIS — E1151 Type 2 diabetes mellitus with diabetic peripheral angiopathy without gangrene: Secondary | ICD-10-CM | POA: Diagnosis not present

## 2019-12-12 DIAGNOSIS — Z79899 Other long term (current) drug therapy: Secondary | ICD-10-CM | POA: Diagnosis not present

## 2019-12-12 DIAGNOSIS — M6281 Muscle weakness (generalized): Secondary | ICD-10-CM | POA: Diagnosis not present

## 2019-12-12 DIAGNOSIS — E559 Vitamin D deficiency, unspecified: Secondary | ICD-10-CM | POA: Diagnosis not present

## 2019-12-12 DIAGNOSIS — E785 Hyperlipidemia, unspecified: Secondary | ICD-10-CM | POA: Diagnosis not present

## 2019-12-12 DIAGNOSIS — J449 Chronic obstructive pulmonary disease, unspecified: Secondary | ICD-10-CM | POA: Diagnosis not present

## 2019-12-12 DIAGNOSIS — Z992 Dependence on renal dialysis: Secondary | ICD-10-CM | POA: Diagnosis not present

## 2019-12-12 DIAGNOSIS — R2689 Other abnormalities of gait and mobility: Secondary | ICD-10-CM | POA: Diagnosis not present

## 2019-12-12 DIAGNOSIS — E1151 Type 2 diabetes mellitus with diabetic peripheral angiopathy without gangrene: Secondary | ICD-10-CM | POA: Diagnosis not present

## 2019-12-12 DIAGNOSIS — U071 COVID-19: Secondary | ICD-10-CM | POA: Diagnosis not present

## 2019-12-12 DIAGNOSIS — Z741 Need for assistance with personal care: Secondary | ICD-10-CM | POA: Diagnosis not present

## 2019-12-13 DIAGNOSIS — Z741 Need for assistance with personal care: Secondary | ICD-10-CM | POA: Diagnosis not present

## 2019-12-13 DIAGNOSIS — J449 Chronic obstructive pulmonary disease, unspecified: Secondary | ICD-10-CM | POA: Diagnosis not present

## 2019-12-13 DIAGNOSIS — M6281 Muscle weakness (generalized): Secondary | ICD-10-CM | POA: Diagnosis not present

## 2019-12-13 DIAGNOSIS — E1151 Type 2 diabetes mellitus with diabetic peripheral angiopathy without gangrene: Secondary | ICD-10-CM | POA: Diagnosis not present

## 2019-12-13 DIAGNOSIS — R2689 Other abnormalities of gait and mobility: Secondary | ICD-10-CM | POA: Diagnosis not present

## 2019-12-13 DIAGNOSIS — U071 COVID-19: Secondary | ICD-10-CM | POA: Diagnosis not present

## 2019-12-15 DIAGNOSIS — U071 COVID-19: Secondary | ICD-10-CM | POA: Diagnosis not present

## 2019-12-15 DIAGNOSIS — J449 Chronic obstructive pulmonary disease, unspecified: Secondary | ICD-10-CM | POA: Diagnosis not present

## 2019-12-15 DIAGNOSIS — M6281 Muscle weakness (generalized): Secondary | ICD-10-CM | POA: Diagnosis not present

## 2019-12-15 DIAGNOSIS — N186 End stage renal disease: Secondary | ICD-10-CM | POA: Diagnosis not present

## 2019-12-15 DIAGNOSIS — Z79899 Other long term (current) drug therapy: Secondary | ICD-10-CM | POA: Diagnosis not present

## 2019-12-15 DIAGNOSIS — Z741 Need for assistance with personal care: Secondary | ICD-10-CM | POA: Diagnosis not present

## 2019-12-15 DIAGNOSIS — E785 Hyperlipidemia, unspecified: Secondary | ICD-10-CM | POA: Diagnosis not present

## 2019-12-15 DIAGNOSIS — R2689 Other abnormalities of gait and mobility: Secondary | ICD-10-CM | POA: Diagnosis not present

## 2019-12-15 DIAGNOSIS — E559 Vitamin D deficiency, unspecified: Secondary | ICD-10-CM | POA: Diagnosis not present

## 2019-12-15 DIAGNOSIS — E1151 Type 2 diabetes mellitus with diabetic peripheral angiopathy without gangrene: Secondary | ICD-10-CM | POA: Diagnosis not present

## 2019-12-15 DIAGNOSIS — F4321 Adjustment disorder with depressed mood: Secondary | ICD-10-CM | POA: Diagnosis not present

## 2019-12-19 DIAGNOSIS — Z741 Need for assistance with personal care: Secondary | ICD-10-CM | POA: Diagnosis not present

## 2019-12-19 DIAGNOSIS — R2689 Other abnormalities of gait and mobility: Secondary | ICD-10-CM | POA: Diagnosis not present

## 2019-12-19 DIAGNOSIS — Z79899 Other long term (current) drug therapy: Secondary | ICD-10-CM | POA: Diagnosis not present

## 2019-12-19 DIAGNOSIS — J449 Chronic obstructive pulmonary disease, unspecified: Secondary | ICD-10-CM | POA: Diagnosis not present

## 2019-12-19 DIAGNOSIS — Z992 Dependence on renal dialysis: Secondary | ICD-10-CM | POA: Diagnosis not present

## 2019-12-19 DIAGNOSIS — E785 Hyperlipidemia, unspecified: Secondary | ICD-10-CM | POA: Diagnosis not present

## 2019-12-19 DIAGNOSIS — E559 Vitamin D deficiency, unspecified: Secondary | ICD-10-CM | POA: Diagnosis not present

## 2019-12-19 DIAGNOSIS — U071 COVID-19: Secondary | ICD-10-CM | POA: Diagnosis not present

## 2019-12-19 DIAGNOSIS — M6281 Muscle weakness (generalized): Secondary | ICD-10-CM | POA: Diagnosis not present

## 2019-12-19 DIAGNOSIS — E1151 Type 2 diabetes mellitus with diabetic peripheral angiopathy without gangrene: Secondary | ICD-10-CM | POA: Diagnosis not present

## 2019-12-20 DIAGNOSIS — Z741 Need for assistance with personal care: Secondary | ICD-10-CM | POA: Diagnosis not present

## 2019-12-20 DIAGNOSIS — E1151 Type 2 diabetes mellitus with diabetic peripheral angiopathy without gangrene: Secondary | ICD-10-CM | POA: Diagnosis not present

## 2019-12-20 DIAGNOSIS — R2689 Other abnormalities of gait and mobility: Secondary | ICD-10-CM | POA: Diagnosis not present

## 2019-12-20 DIAGNOSIS — U071 COVID-19: Secondary | ICD-10-CM | POA: Diagnosis not present

## 2019-12-20 DIAGNOSIS — J449 Chronic obstructive pulmonary disease, unspecified: Secondary | ICD-10-CM | POA: Diagnosis not present

## 2019-12-20 DIAGNOSIS — M6281 Muscle weakness (generalized): Secondary | ICD-10-CM | POA: Diagnosis not present

## 2019-12-21 DIAGNOSIS — F4321 Adjustment disorder with depressed mood: Secondary | ICD-10-CM | POA: Diagnosis not present

## 2019-12-28 DIAGNOSIS — F4321 Adjustment disorder with depressed mood: Secondary | ICD-10-CM | POA: Diagnosis not present

## 2020-01-04 DIAGNOSIS — F4321 Adjustment disorder with depressed mood: Secondary | ICD-10-CM | POA: Diagnosis not present

## 2020-01-04 DIAGNOSIS — Z23 Encounter for immunization: Secondary | ICD-10-CM | POA: Diagnosis not present

## 2020-01-09 DIAGNOSIS — Z992 Dependence on renal dialysis: Secondary | ICD-10-CM | POA: Diagnosis not present

## 2020-01-09 DIAGNOSIS — J449 Chronic obstructive pulmonary disease, unspecified: Secondary | ICD-10-CM | POA: Diagnosis not present

## 2020-01-09 DIAGNOSIS — N186 End stage renal disease: Secondary | ICD-10-CM | POA: Diagnosis not present

## 2020-01-09 DIAGNOSIS — E785 Hyperlipidemia, unspecified: Secondary | ICD-10-CM | POA: Diagnosis not present

## 2020-01-24 DIAGNOSIS — Z03818 Encounter for observation for suspected exposure to other biological agents ruled out: Secondary | ICD-10-CM | POA: Diagnosis not present

## 2020-01-26 DIAGNOSIS — F4321 Adjustment disorder with depressed mood: Secondary | ICD-10-CM | POA: Diagnosis not present

## 2020-01-31 DIAGNOSIS — G629 Polyneuropathy, unspecified: Secondary | ICD-10-CM | POA: Diagnosis not present

## 2020-01-31 DIAGNOSIS — F329 Major depressive disorder, single episode, unspecified: Secondary | ICD-10-CM | POA: Diagnosis not present

## 2020-01-31 DIAGNOSIS — I10 Essential (primary) hypertension: Secondary | ICD-10-CM | POA: Diagnosis not present

## 2020-02-06 DIAGNOSIS — R296 Repeated falls: Secondary | ICD-10-CM | POA: Diagnosis not present

## 2020-02-06 DIAGNOSIS — R531 Weakness: Secondary | ICD-10-CM | POA: Diagnosis not present

## 2020-02-08 DIAGNOSIS — F4320 Adjustment disorder, unspecified: Secondary | ICD-10-CM | POA: Diagnosis not present

## 2020-02-27 DIAGNOSIS — F4321 Adjustment disorder with depressed mood: Secondary | ICD-10-CM | POA: Diagnosis not present

## 2020-03-09 DIAGNOSIS — N186 End stage renal disease: Secondary | ICD-10-CM | POA: Diagnosis not present

## 2020-03-09 DIAGNOSIS — J449 Chronic obstructive pulmonary disease, unspecified: Secondary | ICD-10-CM | POA: Diagnosis not present

## 2020-03-09 DIAGNOSIS — Z992 Dependence on renal dialysis: Secondary | ICD-10-CM | POA: Diagnosis not present

## 2020-03-09 DIAGNOSIS — I679 Cerebrovascular disease, unspecified: Secondary | ICD-10-CM | POA: Diagnosis not present

## 2020-03-15 DIAGNOSIS — F4321 Adjustment disorder with depressed mood: Secondary | ICD-10-CM | POA: Diagnosis not present

## 2020-03-20 DIAGNOSIS — F4320 Adjustment disorder, unspecified: Secondary | ICD-10-CM | POA: Diagnosis not present

## 2020-04-16 DIAGNOSIS — F4321 Adjustment disorder with depressed mood: Secondary | ICD-10-CM | POA: Diagnosis not present

## 2020-05-01 DIAGNOSIS — E1151 Type 2 diabetes mellitus with diabetic peripheral angiopathy without gangrene: Secondary | ICD-10-CM | POA: Diagnosis not present

## 2020-05-01 DIAGNOSIS — N186 End stage renal disease: Secondary | ICD-10-CM | POA: Diagnosis not present

## 2020-05-01 DIAGNOSIS — M6259 Muscle wasting and atrophy, not elsewhere classified, multiple sites: Secondary | ICD-10-CM | POA: Diagnosis not present

## 2020-05-01 DIAGNOSIS — M6281 Muscle weakness (generalized): Secondary | ICD-10-CM | POA: Diagnosis not present

## 2020-05-01 DIAGNOSIS — I639 Cerebral infarction, unspecified: Secondary | ICD-10-CM | POA: Diagnosis not present

## 2020-05-02 DIAGNOSIS — E1151 Type 2 diabetes mellitus with diabetic peripheral angiopathy without gangrene: Secondary | ICD-10-CM | POA: Diagnosis not present

## 2020-05-02 DIAGNOSIS — F4321 Adjustment disorder with depressed mood: Secondary | ICD-10-CM | POA: Diagnosis not present

## 2020-05-02 DIAGNOSIS — M6281 Muscle weakness (generalized): Secondary | ICD-10-CM | POA: Diagnosis not present

## 2020-05-02 DIAGNOSIS — M6259 Muscle wasting and atrophy, not elsewhere classified, multiple sites: Secondary | ICD-10-CM | POA: Diagnosis not present

## 2020-05-02 DIAGNOSIS — N186 End stage renal disease: Secondary | ICD-10-CM | POA: Diagnosis not present

## 2020-05-02 DIAGNOSIS — I639 Cerebral infarction, unspecified: Secondary | ICD-10-CM | POA: Diagnosis not present

## 2020-05-03 DIAGNOSIS — I639 Cerebral infarction, unspecified: Secondary | ICD-10-CM | POA: Diagnosis not present

## 2020-05-03 DIAGNOSIS — M6259 Muscle wasting and atrophy, not elsewhere classified, multiple sites: Secondary | ICD-10-CM | POA: Diagnosis not present

## 2020-05-03 DIAGNOSIS — E1151 Type 2 diabetes mellitus with diabetic peripheral angiopathy without gangrene: Secondary | ICD-10-CM | POA: Diagnosis not present

## 2020-05-03 DIAGNOSIS — M6281 Muscle weakness (generalized): Secondary | ICD-10-CM | POA: Diagnosis not present

## 2020-05-03 DIAGNOSIS — N186 End stage renal disease: Secondary | ICD-10-CM | POA: Diagnosis not present

## 2020-05-04 DIAGNOSIS — E1151 Type 2 diabetes mellitus with diabetic peripheral angiopathy without gangrene: Secondary | ICD-10-CM | POA: Diagnosis not present

## 2020-05-04 DIAGNOSIS — M6259 Muscle wasting and atrophy, not elsewhere classified, multiple sites: Secondary | ICD-10-CM | POA: Diagnosis not present

## 2020-05-04 DIAGNOSIS — N186 End stage renal disease: Secondary | ICD-10-CM | POA: Diagnosis not present

## 2020-05-04 DIAGNOSIS — I639 Cerebral infarction, unspecified: Secondary | ICD-10-CM | POA: Diagnosis not present

## 2020-05-04 DIAGNOSIS — M6281 Muscle weakness (generalized): Secondary | ICD-10-CM | POA: Diagnosis not present

## 2020-05-07 DIAGNOSIS — M6259 Muscle wasting and atrophy, not elsewhere classified, multiple sites: Secondary | ICD-10-CM | POA: Diagnosis not present

## 2020-05-07 DIAGNOSIS — N186 End stage renal disease: Secondary | ICD-10-CM | POA: Diagnosis not present

## 2020-05-07 DIAGNOSIS — I639 Cerebral infarction, unspecified: Secondary | ICD-10-CM | POA: Diagnosis not present

## 2020-05-07 DIAGNOSIS — E1151 Type 2 diabetes mellitus with diabetic peripheral angiopathy without gangrene: Secondary | ICD-10-CM | POA: Diagnosis not present

## 2020-05-07 DIAGNOSIS — M6281 Muscle weakness (generalized): Secondary | ICD-10-CM | POA: Diagnosis not present

## 2020-05-08 DIAGNOSIS — M6281 Muscle weakness (generalized): Secondary | ICD-10-CM | POA: Diagnosis not present

## 2020-05-08 DIAGNOSIS — N186 End stage renal disease: Secondary | ICD-10-CM | POA: Diagnosis not present

## 2020-05-08 DIAGNOSIS — E1151 Type 2 diabetes mellitus with diabetic peripheral angiopathy without gangrene: Secondary | ICD-10-CM | POA: Diagnosis not present

## 2020-05-08 DIAGNOSIS — M6259 Muscle wasting and atrophy, not elsewhere classified, multiple sites: Secondary | ICD-10-CM | POA: Diagnosis not present

## 2020-05-08 DIAGNOSIS — I679 Cerebrovascular disease, unspecified: Secondary | ICD-10-CM | POA: Diagnosis not present

## 2020-05-08 DIAGNOSIS — I639 Cerebral infarction, unspecified: Secondary | ICD-10-CM | POA: Diagnosis not present

## 2020-05-08 DIAGNOSIS — I2510 Atherosclerotic heart disease of native coronary artery without angina pectoris: Secondary | ICD-10-CM | POA: Diagnosis not present

## 2020-05-08 DIAGNOSIS — J449 Chronic obstructive pulmonary disease, unspecified: Secondary | ICD-10-CM | POA: Diagnosis not present

## 2020-05-09 ENCOUNTER — Telehealth: Payer: Self-pay

## 2020-05-09 NOTE — Telephone Encounter (Signed)
Telephone call received from nurse at [facility] stating the provider would like patient seen for loss of sensation to Rt leg- first reported yesterday. Denied any associated symptoms.   Pt scheduled for ABI on 6/17/21and f/u with PA on 05/22/20. Nurse Vee voiced understanding of appt information. Minette Brine, RN

## 2020-05-14 ENCOUNTER — Other Ambulatory Visit: Payer: Self-pay | Admitting: *Deleted

## 2020-05-14 DIAGNOSIS — I739 Peripheral vascular disease, unspecified: Secondary | ICD-10-CM

## 2020-05-15 DIAGNOSIS — M6281 Muscle weakness (generalized): Secondary | ICD-10-CM | POA: Diagnosis not present

## 2020-05-15 DIAGNOSIS — I639 Cerebral infarction, unspecified: Secondary | ICD-10-CM | POA: Diagnosis not present

## 2020-05-15 DIAGNOSIS — E1151 Type 2 diabetes mellitus with diabetic peripheral angiopathy without gangrene: Secondary | ICD-10-CM | POA: Diagnosis not present

## 2020-05-15 DIAGNOSIS — M6259 Muscle wasting and atrophy, not elsewhere classified, multiple sites: Secondary | ICD-10-CM | POA: Diagnosis not present

## 2020-05-15 DIAGNOSIS — N186 End stage renal disease: Secondary | ICD-10-CM | POA: Diagnosis not present

## 2020-05-17 ENCOUNTER — Ambulatory Visit (HOSPITAL_COMMUNITY)
Admission: RE | Admit: 2020-05-17 | Discharge: 2020-05-17 | Disposition: A | Discharge status: Home / Self Care | Payer: Medicare Other | Source: Ambulatory Visit | Attending: Vascular Surgery | Admitting: Vascular Surgery

## 2020-05-17 ENCOUNTER — Other Ambulatory Visit: Payer: Self-pay

## 2020-05-17 DIAGNOSIS — I739 Peripheral vascular disease, unspecified: Secondary | ICD-10-CM | POA: Diagnosis not present

## 2020-05-17 DIAGNOSIS — N186 End stage renal disease: Secondary | ICD-10-CM | POA: Diagnosis not present

## 2020-05-17 DIAGNOSIS — M6259 Muscle wasting and atrophy, not elsewhere classified, multiple sites: Secondary | ICD-10-CM | POA: Diagnosis not present

## 2020-05-17 DIAGNOSIS — E1151 Type 2 diabetes mellitus with diabetic peripheral angiopathy without gangrene: Secondary | ICD-10-CM | POA: Diagnosis not present

## 2020-05-17 DIAGNOSIS — I639 Cerebral infarction, unspecified: Secondary | ICD-10-CM | POA: Diagnosis not present

## 2020-05-17 DIAGNOSIS — M6281 Muscle weakness (generalized): Secondary | ICD-10-CM | POA: Diagnosis not present

## 2020-05-22 ENCOUNTER — Ambulatory Visit (INDEPENDENT_AMBULATORY_CARE_PROVIDER_SITE_OTHER): Payer: Medicare Other | Admitting: Physician Assistant

## 2020-05-22 ENCOUNTER — Other Ambulatory Visit: Payer: Self-pay

## 2020-05-22 VITALS — BP 156/61 | HR 66 | Temp 97.7°F | Resp 20 | Ht 70.0 in | Wt 147.5 lb

## 2020-05-22 DIAGNOSIS — I739 Peripheral vascular disease, unspecified: Secondary | ICD-10-CM | POA: Diagnosis not present

## 2020-05-22 NOTE — Progress Notes (Addendum)
Office Note     CC:  follow up Requesting Provider:  Clinic, [facility]  HPI: Trevor [name] is a 74 y.o. (Mar 05, 1946) male who presents with report of loss of sensation of his right lower extremity  Review of the patient's last office note dated August 25, 2019 by Dr. Oneida Alar.  The patient initially underwent axillary bifemoral bypass grafting in 2018 by Dr. Donnetta Hutching.  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  At the time of Dr. Oneida Alar' evaluation, he was being seen for right SFA occlusion.   Dr. Oneida Alar reviewed his arterial duplex study dated July 07, 2019 which showed patent right common femoral artery with occlusion of the right superficial femoral artery.  There was posterior tibial artery runoff to the right foot.  He is a resident at [facility] and our office was contacted June 9th stating the provider had assessed the patient and  he was complaining of loss of sensation to his right leg.  He tells me today he has noticed this over several months time.  He denies lower extremity pain.  He describes overall weakness.  He bears weight on his right foot to transfer from chair to bed, etc.  Past medical and surgical history as well as medications are reviewed.  He continues on Plavix, aspirin and statin. HD on M/W/F Past Medical History:  Diagnosis Date  . AAA (abdominal aortic aneurysm) ([place])   . Anemia   . Anxiety   . Arthritis    arms and back  . Blindness   . CHF (congestive heart failure) ([place])   . Coronary artery disease    mild   . 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 Diabetes mellitus without complication ([place])   . ESRD on hemodialysis ([place])    started HD 2014-15  . Headache(784.0)   . HTN (hypertension)   . Protein-calorie malnutrition ([place])   . STEMI (ST elevation myocardial infarction) ([place]) 11/11/2018  . Stroke [facility]) 01/2018   no residual deficits    Past Surgical History:    Procedure Laterality Date  . A/V FISTULAGRAM N/A 07/29/2018   Procedure: A/V FISTULAGRAM - left upper extremity;  Surgeon: Waynetta Sandy, MD;  Location: [place] CV LAB;  Service: Cardiovascular;  Laterality: N/A;  . AMPUTATION Left 03/01/2019   Procedure: AMPUTATION ABOVE KNEE;  Surgeon: Angelia Mould, MD;  Location: [place];  Service: Vascular;  Laterality: Left;  . AXILLARY-FEMORAL BYPASS GRAFT  06/28/2017   Procedure: BYPASS GRAFT RIGHT AXILLA-BIFEMORAL USING 8MM X 30CM AND 8MM X 60CM HEMASHIELD GOLD GRAFTS;  Surgeon: Rosetta Posner, MD;  Location: [place];  Service: Vascular;;  . CARDIAC CATHETERIZATION N/A 11/01/2016   Procedure: Left Heart Cath and Coronary Angiography;  Surgeon: Lorretta Harp, MD;  Location: [place] CV LAB;  Service: Cardiovascular;  Laterality: N/A;  . ENDARTERECTOMY FEMORAL Bilateral 06/28/2017   Procedure: ENDARTERECTOMY BILATERAL FEMORAL ARTERIES;  Surgeon: Rosetta Posner, MD;  Location: [place];  Service: Vascular;  Laterality: Bilateral;  . FISTULA SUPERFICIALIZATION Left 12/31/8655   Procedure: PLICATION OF ARTERIOVENOUS FISTULA LEFT ARM;  Surgeon: Waynetta Sandy, MD;  Location: [place];  Service: Vascular;  Laterality: Left;  . FISTULOGRAM Left 05/20/2018   Procedure: FISTULOGRAM WITH BALLOON ANGIOPLASTY LEFT ARM ARTERIOVENOUS FISTULA;  Surgeon: Waynetta Sandy, MD;  Location: [place];  Service: Vascular;  Laterality: Left;  . INSERTION OF DIALYSIS CATHETER  09/21/2012  Procedure: INSERTION OF DIALYSIS CATHETER;  Surgeon: Angelia Mould, MD;  Location: [place];  Service: Vascular;  Laterality: N/A;  Right Internal Jugular Placement  . LEFT HEART CATH N/A 11/11/2018   Procedure: LEFT HEART CATH;  Surgeon: Sherren Mocha, MD;  Location: [place] CV LAB;  Service: Cardiovascular;  Laterality: N/A;  . LEFT HEART CATHETERIZATION WITH CORONARY ANGIOGRAM N/A 09/28/2012   Procedure: LEFT HEART CATHETERIZATION WITH CORONARY  ANGIOGRAM;  Surgeon: Sherren Mocha, MD;  Location: [facility] CATH LAB;  Service: Cardiovascular;  Laterality: N/A;  . LEFT HEART CATHETERIZATION WITH CORONARY ANGIOGRAM N/A 07/07/2014   Procedure: LEFT HEART CATHETERIZATION WITH CORONARY ANGIOGRAM;  Surgeon: Leonie Man, MD;  Location: [facility] CATH LAB;  Service: Cardiovascular;  Laterality: N/A;  . PERIPHERAL VASCULAR BALLOON ANGIOPLASTY Left 07/29/2018   Procedure: PERIPHERAL VASCULAR BALLOON ANGIOPLASTY;  Surgeon: Waynetta Sandy, MD;  Location: [place] CV LAB;  Service: Cardiovascular;  Laterality: Left;  CENTRAL VEIN  . PERIPHERAL VASCULAR INTERVENTION Left 07/29/2018   Procedure: PERIPHERAL VASCULAR INTERVENTION;  Surgeon: Waynetta Sandy, MD;  Location: [place] CV LAB;  Service: Cardiovascular;  Laterality: Left;  AXILLARY VEIN  . REVISION OF ARTERIOVENOUS GORETEX GRAFT Left 12/30/2018   Procedure: REVISION OF ARTERIOVENOUS FISTULA LEFT ARM;  Surgeon: Rosetta Posner, MD;  Location: [place];  Service: Vascular;  Laterality: Left;  . REVISON OF ARTERIOVENOUS FISTULA Left 08/17/2018   Procedure: REVISION OF ARTERIOVENOUS FISTULA LEFT ARM;  Surgeon: Waynetta Sandy, MD;  Location: [place];  Service: Vascular;  Laterality: Left;  . right hand    . TEE WITHOUT CARDIOVERSION  10/09/2011   Procedure: TRANSESOPHAGEAL ECHOCARDIOGRAM (TEE);  Surgeon: Lelon Perla, MD;  Location: [facility] ENDOSCOPY;  Service: Cardiovascular;  Laterality: N/A;    Social History   Socioeconomic History  . Marital status: Widowed    Spouse name: Not on file  . Number of children: 2  . Years of education: Not on file  . Highest education level: Not on file  Occupational History  . Not on file  Tobacco Use  . Smoking status: Current Every Day Smoker    Packs/day: 0.50    Years: 60.00    Pack years: 30.00    Types: Cigarettes  . Smokeless tobacco: Never Used  . Tobacco comment: 5-6 cigarettes per day  Vaping Use  . Vaping Use: Never used    Substance and Sexual Activity  . Alcohol use: No    Alcohol/week: 0.0 standard drinks    Comment: Occasional  . Drug use: No    Frequency: 2.0 times per week    Types: "Crack" cocaine  . Sexual activity: Yes  Other Topics Concern  . Not on file  Social History Narrative   Used to work in a Loss adjuster, chartered.    Social Determinants of Health   Financial Resource Strain:   . Difficulty of Paying Living Expenses:   Food Insecurity:   . Worried About Charity fundraiser in the Last Year:   . Arboriculturist in the Last Year:   Transportation Needs:   . Film/video editor (Medical):   Marland Kitchen Lack of Transportation (Non-Medical):   Physical Activity:   . Days of Exercise per Week:   . Minutes of Exercise per Session:   Stress:   . Feeling of Stress :   Social Connections:   . Frequency of Communication with Friends and Family:   . Frequency of Social Gatherings with Friends and Family:   .  Attends Religious Services:   . Active Member of Clubs or Organizations:   . Attends Archivist Meetings:   Marland Kitchen Marital Status:   Intimate Partner Violence:   . Fear of Current or Ex-Partner:   . Emotionally Abused:   Marland Kitchen Physically Abused:   . Sexually Abused:    Family History  Problem Relation Age of Onset  . Heart attack Mother        MI in her 36s  . Diabetes Mother   . Alcohol abuse Father   . Anesthesia problems Neg Hx   . Hypotension Neg Hx   . Malignant hyperthermia Neg Hx   . Pseudochol deficiency Neg Hx     Current Outpatient Medications  Medication Sig Dispense Refill  . aspirin EC 81 MG tablet Take 1 tablet (81 mg total) by mouth daily. 90 tablet 3  . atorvastatin (LIPITOR) 80 MG tablet Take 1 tablet (80 mg total) by mouth at bedtime. 30 tablet 6  . b complex-vitamin c-folic acid (NEPHRO-VITE) 0.8 MG TABS tablet Take 1 tablet by mouth daily.    . bisacodyl (DULCOLAX) 10 MG suppository Place 10 mg rectally daily as needed (constipation).     . cinacalcet  (SENSIPAR) 90 MG tablet Take 90 mg by mouth daily with supper.    . clopidogrel (PLAVIX) 75 MG tablet Take 1 tablet (75 mg total) by mouth daily. 30 tablet 2  . docusate sodium (COLACE) 100 MG capsule Take 200 mg by mouth at bedtime.     . gabapentin (NEURONTIN) 300 MG capsule Take 1 capsule (300 mg total) by mouth at bedtime. 30 capsule 0  . Glycopyrrolate 15.6 MCG CAPS Place 15.6 mg into inhaler and inhale 2 (two) times a day.    Vanessa Kick Ethyl (VASCEPA) 1 g CAPS Take 1 g by mouth 2 (two) times daily.     . isosorbide mononitrate (IMDUR) 30 MG 24 hr tablet Take 1 tablet (30 mg total) by mouth daily. 90 tablet 3  . metoprolol tartrate (LOPRESSOR) 25 MG tablet Take 0.5 tablets (12.5 mg total) by mouth 2 (two) times daily. 30 tablet 5  . mirtazapine (REMERON) 15 MG tablet Take 15 mg by mouth at bedtime.     . nitroGLYCERIN (NITROSTAT) 0.4 MG SL tablet Place 1 tablet (0.4 mg total) under the tongue every 5 (five) minutes x 3 doses as needed for chest pain. 25 tablet 12  . Nutritional Supplements (ENSURE PO) Take 237 mLs by mouth 2 (two) times daily.     . Nutritional Supplements (FEEDING SUPPLEMENT, NEPRO CARB STEADY,) LIQD Take 237 mLs by mouth 2 (two) times daily between meals.  0  . Nutritional Supplements (PROMOD) LIQD Take 30 mLs by mouth 2 (two) times a day.    . ondansetron (ZOFRAN) 4 MG tablet Take 4 mg by mouth every 6 (six) hours as needed for nausea or vomiting.    . senna (SENOKOT) 8.6 MG TABS tablet Take 1 tablet by mouth daily.    . sevelamer carbonate (RENVELA) 800 MG tablet Take 1,600 mg by mouth 3 (three) times daily with meals.      No current facility-administered medications for this visit.    No Known Allergies   REVIEW OF SYSTEMS:   [X]  denotes positive finding, [ ]  denotes negative finding Cardiac  Comments:  Chest pain or chest pressure:    Shortness of breath upon exertion:    Short of breath when lying flat:    Irregular heart rhythm:  Vascular     Pain in calf, thigh, or hip brought on by ambulation:    Pain in feet at night that wakes you up from your sleep:     Blood clot in your veins:    Leg swelling:         Pulmonary    Oxygen at home:    Productive cough:     Wheezing:         Neurologic    Sudden weakness in arms or legs:     Sudden numbness in arms or legs:     Sudden onset of difficulty speaking or slurred speech:    Temporary loss of vision in one eye:     Problems with dizziness:         Gastrointestinal    Blood in stool:     Vomited blood:         Genitourinary    Burning when urinating:     Blood in urine:        Psychiatric    Major depression:         Hematologic    Bleeding problems:    Problems with blood clotting too easily:        Skin    Rashes or ulcers:        Constitutional    Fever or chills:      PHYSICAL EXAMINATION:  Vitals:   05/22/20 0958  BP: (!) 156/61  Pulse: 66  Resp: 20  Temp: 97.7 F (36.5 C)  SpO2: 96%   General: cachetic male in NAD; vital signs documented above Gait: Non ambulatory HENT: WNL, normocephalic Pulmonary: normal non-labored breathing , without Rales, rhonchi,  wheezing Cardiac: irregular HR, without  Murmurs  Abdomen: Right-sided graft with bruit present Skin: without rashes Vascular Exam/Pulses: He has 2+ bilateral palpable femoral pulses.  He has Doppler signals of the right popliteal, posterior tibial and weak peroneal artery signal. Extremities: with chronic ischemic changes including loss of hair and shiny skin, without Gangrene , without cellulitis; 2 to 3 mm area on the dorsum of the right foot with superficial skin loss.  Additional areas at the junction of his ankle and foot that are hypopigmented.  These areas are without fluctuance, tenderness or drainage.  Left upper arm AV fistula with good bruit and thrill.  Pigment loss on several areas.  Skin is intact.  2+ left radial pulse Musculoskeletal: Positive muscle wasting  Neurologic: A&O  X 3;  No focal weakness or paresthesias are detected Psychiatric:  The pt has Normal affect.     Non-Invasive Vascular Imaging:   Right Rt Pressure (mmHg) Index Waveform Comment Brachial 190 Popliteal monophasic PTA monophasic non-compressible DP monophasic non-compressible  Left Lt Pressure (mmHg) Index Waveform Comment Brachial AVF PTA AKA DP AKA  ABI/TBI Today's ABI Today's TBI Previous ABI Previous TBI Right Dunn 0.37 0.56 0.18 Left AKA AKA 0.26 0.00  Previous right TP: 29  ASSESSMENT/PLAN:: 74 y.o. male here for complaints of loss of sensation right lower leg.  This is subjective with intact sensation of his lower leg to the base of his toes.  He does have small areas of skin loss to the dorsum of his right foot.  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  There is no evidence for acute right lower extremity ischemia.  I will review the case with Dr. Oneida Alar and provide further recommendations.  Recommend good skin care with daily soap and water and scrub and  apply Betadine paint to dorsum of right foot.  Addendum: I reviewed the case with Dr. Oneida Alar today 05/23/2020.  Agree with recommendations as outlined above.  He will see the patient in 3 to 4 weeks to monitor right foot.  Barbie Banner, PA-C [facility] [phone]  Clinic MD:  Carlis Abbott

## 2020-05-29 DIAGNOSIS — N186 End stage renal disease: Secondary | ICD-10-CM | POA: Diagnosis not present

## 2020-05-29 DIAGNOSIS — E1151 Type 2 diabetes mellitus with diabetic peripheral angiopathy without gangrene: Secondary | ICD-10-CM | POA: Diagnosis not present

## 2020-05-29 DIAGNOSIS — M6281 Muscle weakness (generalized): Secondary | ICD-10-CM | POA: Diagnosis not present

## 2020-05-29 DIAGNOSIS — I639 Cerebral infarction, unspecified: Secondary | ICD-10-CM | POA: Diagnosis not present

## 2020-05-29 DIAGNOSIS — M6259 Muscle wasting and atrophy, not elsewhere classified, multiple sites: Secondary | ICD-10-CM | POA: Diagnosis not present

## 2020-05-30 DIAGNOSIS — M6259 Muscle wasting and atrophy, not elsewhere classified, multiple sites: Secondary | ICD-10-CM | POA: Diagnosis not present

## 2020-05-30 DIAGNOSIS — M6281 Muscle weakness (generalized): Secondary | ICD-10-CM | POA: Diagnosis not present

## 2020-05-30 DIAGNOSIS — E1151 Type 2 diabetes mellitus with diabetic peripheral angiopathy without gangrene: Secondary | ICD-10-CM | POA: Diagnosis not present

## 2020-05-30 DIAGNOSIS — N186 End stage renal disease: Secondary | ICD-10-CM | POA: Diagnosis not present

## 2020-05-30 DIAGNOSIS — I639 Cerebral infarction, unspecified: Secondary | ICD-10-CM | POA: Diagnosis not present

## 2020-05-31 DIAGNOSIS — E1151 Type 2 diabetes mellitus with diabetic peripheral angiopathy without gangrene: Secondary | ICD-10-CM | POA: Diagnosis not present

## 2020-05-31 DIAGNOSIS — M6259 Muscle wasting and atrophy, not elsewhere classified, multiple sites: Secondary | ICD-10-CM | POA: Diagnosis not present

## 2020-05-31 DIAGNOSIS — I639 Cerebral infarction, unspecified: Secondary | ICD-10-CM | POA: Diagnosis not present

## 2020-05-31 DIAGNOSIS — M6281 Muscle weakness (generalized): Secondary | ICD-10-CM | POA: Diagnosis not present

## 2020-05-31 DIAGNOSIS — N186 End stage renal disease: Secondary | ICD-10-CM | POA: Diagnosis not present

## 2020-06-07 DIAGNOSIS — M6281 Muscle weakness (generalized): Secondary | ICD-10-CM | POA: Diagnosis not present

## 2020-06-07 DIAGNOSIS — I639 Cerebral infarction, unspecified: Secondary | ICD-10-CM | POA: Diagnosis not present

## 2020-06-07 DIAGNOSIS — N186 End stage renal disease: Secondary | ICD-10-CM | POA: Diagnosis not present

## 2020-06-07 DIAGNOSIS — E1151 Type 2 diabetes mellitus with diabetic peripheral angiopathy without gangrene: Secondary | ICD-10-CM | POA: Diagnosis not present

## 2020-06-07 DIAGNOSIS — M6259 Muscle wasting and atrophy, not elsewhere classified, multiple sites: Secondary | ICD-10-CM | POA: Diagnosis not present

## 2020-06-07 DIAGNOSIS — F4321 Adjustment disorder with depressed mood: Secondary | ICD-10-CM | POA: Diagnosis not present

## 2020-06-12 DIAGNOSIS — F4320 Adjustment disorder, unspecified: Secondary | ICD-10-CM | POA: Diagnosis not present

## 2020-06-14 DIAGNOSIS — E1151 Type 2 diabetes mellitus with diabetic peripheral angiopathy without gangrene: Secondary | ICD-10-CM | POA: Diagnosis not present

## 2020-06-14 DIAGNOSIS — N186 End stage renal disease: Secondary | ICD-10-CM | POA: Diagnosis not present

## 2020-06-14 DIAGNOSIS — M6281 Muscle weakness (generalized): Secondary | ICD-10-CM | POA: Diagnosis not present

## 2020-06-14 DIAGNOSIS — I639 Cerebral infarction, unspecified: Secondary | ICD-10-CM | POA: Diagnosis not present

## 2020-06-14 DIAGNOSIS — M6259 Muscle wasting and atrophy, not elsewhere classified, multiple sites: Secondary | ICD-10-CM | POA: Diagnosis not present

## 2020-06-18 DIAGNOSIS — I679 Cerebrovascular disease, unspecified: Secondary | ICD-10-CM | POA: Diagnosis not present

## 2020-06-18 DIAGNOSIS — J449 Chronic obstructive pulmonary disease, unspecified: Secondary | ICD-10-CM | POA: Diagnosis not present

## 2020-06-18 DIAGNOSIS — N186 End stage renal disease: Secondary | ICD-10-CM | POA: Diagnosis not present

## 2020-06-18 DIAGNOSIS — Z992 Dependence on renal dialysis: Secondary | ICD-10-CM | POA: Diagnosis not present

## 2020-06-18 DIAGNOSIS — I739 Peripheral vascular disease, unspecified: Secondary | ICD-10-CM | POA: Diagnosis not present

## 2020-06-18 DIAGNOSIS — E785 Hyperlipidemia, unspecified: Secondary | ICD-10-CM | POA: Diagnosis not present

## 2020-06-18 DIAGNOSIS — D649 Anemia, unspecified: Secondary | ICD-10-CM | POA: Diagnosis not present

## 2020-06-18 DIAGNOSIS — E1151 Type 2 diabetes mellitus with diabetic peripheral angiopathy without gangrene: Secondary | ICD-10-CM | POA: Diagnosis not present

## 2020-06-21 ENCOUNTER — Ambulatory Visit (INDEPENDENT_AMBULATORY_CARE_PROVIDER_SITE_OTHER): Payer: Medicare Other | Admitting: Vascular Surgery

## 2020-06-21 ENCOUNTER — Ambulatory Visit (INDEPENDENT_AMBULATORY_CARE_PROVIDER_SITE_OTHER): Payer: Medicare Other | Admitting: Podiatry

## 2020-06-21 ENCOUNTER — Encounter: Payer: Self-pay | Admitting: Podiatry

## 2020-06-21 ENCOUNTER — Other Ambulatory Visit: Payer: Self-pay

## 2020-06-21 VITALS — BP 175/75 | HR 72 | Temp 98.4°F | Resp 20 | Ht 70.0 in | Wt 142.0 lb

## 2020-06-21 DIAGNOSIS — S78112A Complete traumatic amputation at level between left hip and knee, initial encounter: Secondary | ICD-10-CM

## 2020-06-21 DIAGNOSIS — B351 Tinea unguium: Secondary | ICD-10-CM

## 2020-06-21 DIAGNOSIS — I739 Peripheral vascular disease, unspecified: Secondary | ICD-10-CM

## 2020-06-21 DIAGNOSIS — L853 Xerosis cutis: Secondary | ICD-10-CM | POA: Diagnosis not present

## 2020-06-21 DIAGNOSIS — R200 Anesthesia of skin: Secondary | ICD-10-CM | POA: Diagnosis not present

## 2020-06-21 NOTE — Progress Notes (Signed)
Patient is a 74 year old male last seen in our APP clinic on May 22, 2020.  At that time he had some wounds on the dorsum of his foot.  He has previously had a left above-knee amputation.  He has previously undergone axillary bifemoral bypass grafting by Dr. Donnetta Hutching in 2018.  He has a known chronic right SFA occlusion.  He states that the ulcers have now healed.  He is in a wheelchair most of the time but does use his right leg for transfer.  Review of systems: He has no shortness of breath.  He has no chest pain.  He does have mild dementia.  Past Medical History:  Diagnosis Date  . AAA (abdominal aortic aneurysm) ([place])   . Anemia   . Anxiety   . Arthritis    arms and back  . Blindness   . CHF (congestive heart failure) ([place])   . Coronary artery disease    mild   . 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 Diabetes mellitus without complication ([place])   . ESRD on hemodialysis ([place])    started HD 2014-15  . Headache(784.0)   . HTN (hypertension)   . Protein-calorie malnutrition ([place])   . STEMI (ST elevation myocardial infarction) ([place]) 11/11/2018  . Stroke [facility]) 01/2018   no residual deficits    Past Surgical History:  Procedure Laterality Date  . A/V FISTULAGRAM N/A 07/29/2018   Procedure: A/V FISTULAGRAM - left upper extremity;  Surgeon: Waynetta Sandy, MD;  Location: [place] CV LAB;  Service: Cardiovascular;  Laterality: N/A;  . AMPUTATION Left 03/01/2019   Procedure: AMPUTATION ABOVE KNEE;  Surgeon: Angelia Mould, MD;  Location: [place];  Service: Vascular;  Laterality: Left;  . AXILLARY-FEMORAL BYPASS GRAFT  06/28/2017   Procedure: BYPASS GRAFT RIGHT AXILLA-BIFEMORAL USING 8MM X 30CM AND 8MM X 60CM HEMASHIELD GOLD GRAFTS;  Surgeon: Rosetta Posner, MD;  Location: [place];  Service: Vascular;;  . CARDIAC CATHETERIZATION N/A 11/01/2016   Procedure: Left Heart Cath and Coronary Angiography;  Surgeon: Lorretta Harp, MD;   Location: [place] CV LAB;  Service: Cardiovascular;  Laterality: N/A;  . ENDARTERECTOMY FEMORAL Bilateral 06/28/2017   Procedure: ENDARTERECTOMY BILATERAL FEMORAL ARTERIES;  Surgeon: Rosetta Posner, MD;  Location: [place];  Service: Vascular;  Laterality: Bilateral;  . FISTULA SUPERFICIALIZATION Left 12/31/8655   Procedure: PLICATION OF ARTERIOVENOUS FISTULA LEFT ARM;  Surgeon: Waynetta Sandy, MD;  Location: [place];  Service: Vascular;  Laterality: Left;  . FISTULOGRAM Left 05/20/2018   Procedure: FISTULOGRAM WITH BALLOON ANGIOPLASTY LEFT ARM ARTERIOVENOUS FISTULA;  Surgeon: Waynetta Sandy, MD;  Location: [place];  Service: Vascular;  Laterality: Left;  . INSERTION OF DIALYSIS CATHETER  09/21/2012   Procedure: INSERTION OF DIALYSIS CATHETER;  Surgeon: Angelia Mould, MD;  Location: [place];  Service: Vascular;  Laterality: N/A;  Right Internal Jugular Placement  . LEFT HEART CATH N/A 11/11/2018   Procedure: LEFT HEART CATH;  Surgeon: Sherren Mocha, MD;  Location: [place] CV LAB;  Service: Cardiovascular;  Laterality: N/A;  . LEFT HEART CATHETERIZATION WITH CORONARY ANGIOGRAM N/A 09/28/2012   Procedure: LEFT HEART CATHETERIZATION WITH CORONARY ANGIOGRAM;  Surgeon: Sherren Mocha, MD;  Location: [facility] CATH LAB;  Service: Cardiovascular;  Laterality: N/A;  . LEFT HEART CATHETERIZATION WITH CORONARY ANGIOGRAM N/A 07/07/2014   Procedure: LEFT HEART CATHETERIZATION WITH CORONARY ANGIOGRAM;  Surgeon: Leonie Man, MD;  Location: [facility]  CATH LAB;  Service: Cardiovascular;  Laterality: N/A;  . PERIPHERAL VASCULAR BALLOON ANGIOPLASTY Left 07/29/2018   Procedure: PERIPHERAL VASCULAR BALLOON ANGIOPLASTY;  Surgeon: Waynetta Sandy, MD;  Location: [place] CV LAB;  Service: Cardiovascular;  Laterality: Left;  CENTRAL VEIN  . PERIPHERAL VASCULAR INTERVENTION Left 07/29/2018   Procedure: PERIPHERAL VASCULAR INTERVENTION;  Surgeon: Waynetta Sandy, MD;  Location: [place] CV LAB;  Service: Cardiovascular;  Laterality: Left;  AXILLARY VEIN  . REVISION OF ARTERIOVENOUS GORETEX GRAFT Left 12/30/2018   Procedure: REVISION OF ARTERIOVENOUS FISTULA LEFT ARM;  Surgeon: Rosetta Posner, MD;  Location: [place];  Service: Vascular;  Laterality: Left;  . REVISON OF ARTERIOVENOUS FISTULA Left 08/17/2018   Procedure: REVISION OF ARTERIOVENOUS FISTULA LEFT ARM;  Surgeon: Waynetta Sandy, MD;  Location: [place];  Service: Vascular;  Laterality: Left;  . right hand    . TEE WITHOUT CARDIOVERSION  10/09/2011   Procedure: TRANSESOPHAGEAL ECHOCARDIOGRAM (TEE);  Surgeon: Lelon Perla, MD;  Location: [facility] ENDOSCOPY;  Service: Cardiovascular;  Laterality: N/A;    Current Outpatient Medications on File Prior to Visit  Medication Sig Dispense Refill  . aspirin EC 81 MG tablet Take 1 tablet (81 mg total) by mouth daily. 90 tablet 3  . atorvastatin (LIPITOR) 80 MG tablet Take 1 tablet (80 mg total) by mouth at bedtime. 30 tablet 6  . b complex-vitamin c-folic acid (NEPHRO-VITE) 0.8 MG TABS tablet Take 1 tablet by mouth daily.    . bisacodyl (DULCOLAX) 10 MG suppository Place 10 mg rectally daily as needed (constipation).     . cinacalcet (SENSIPAR) 90 MG tablet Take 90 mg by mouth daily with supper.    . clopidogrel (PLAVIX) 75 MG tablet Take 1 tablet (75 mg total) by mouth daily. 30 tablet 2  . docusate sodium (COLACE) 100 MG capsule Take 200 mg by mouth at bedtime.     . gabapentin (NEURONTIN) 300 MG capsule Take 1 capsule (300 mg total) by mouth at bedtime. 30 capsule 0  . Glycopyrrolate 15.6 MCG CAPS Place 15.6 mg into inhaler and inhale 2 (two) times a day.    Vanessa Kick Ethyl (VASCEPA) 1 g CAPS Take 1 g by mouth 2 (two) times daily.     . metoprolol tartrate (LOPRESSOR) 25 MG tablet Take 0.5 tablets (12.5 mg total) by mouth 2 (two) times daily. 30 tablet 5  . mirtazapine (REMERON) 15 MG tablet Take 15 mg by mouth at bedtime.     . nitroGLYCERIN (NITROSTAT) 0.4 MG SL  tablet Place 1 tablet (0.4 mg total) under the tongue every 5 (five) minutes x 3 doses as needed for chest pain. 25 tablet 12  . Nutritional Supplements (ENSURE PO) Take 237 mLs by mouth 2 (two) times daily.     . Nutritional Supplements (FEEDING SUPPLEMENT, NEPRO CARB STEADY,) LIQD Take 237 mLs by mouth 2 (two) times daily between meals.  0  . Nutritional Supplements (PROMOD) LIQD Take 30 mLs by mouth 2 (two) times a day.    . ondansetron (ZOFRAN) 4 MG tablet Take 4 mg by mouth every 6 (six) hours as needed for nausea or vomiting.    . senna (SENOKOT) 8.6 MG TABS tablet Take 1 tablet by mouth daily.    . sevelamer carbonate (RENVELA) 800 MG tablet Take 1,600 mg by mouth 3 (three) times daily with meals.     . isosorbide mononitrate (IMDUR) 30 MG 24 hr tablet Take 1 tablet (30 mg total) by mouth daily.  90 tablet 3   No current facility-administered medications on file prior to visit.    Physical exam:  Vitals:   06/21/20 1527  BP: (!) 175/75  Pulse: 72  Resp: 20  Temp: 98.4 F (36.9 C)  SpO2: 98%  Weight: 142 lb (64.4 kg)  Height: 5\' 10"  (1.778 m)    Extremities: All ulcerations on the foot are now completely healed, trace edema  Vascular: No palpable pedal pulses  Data: Reviewed the patient's recent noninvasive study which showed a TBI of 0.37.  Assessment: Patient with known peripheral arterial disease right leg.  He has actually healed the ulcers spontaneously.  Plan: Continue to protect right foot.  No vascular intervention necessary at this point.  Patient will follow up in APP clinic with repeat ABIs and TBI's in 6 months.  He will return sooner if he develops a new wound.  Ruta Hinds, MD [facility] [place] Office: [phone]

## 2020-06-21 NOTE — Patient Instructions (Addendum)
Please make sure the heels and sides of feet do not have pressure on them while in bed, keep a pillow under the calf and let the foot float in the air off the mattress    Foot Care, Adult Foot care is an important part of your health. Noticing and addressing any potential problems early is the best way to prevent future foot problems. How to care for your [place] your feet daily with warm water and mild soap. Do not use hot water. Then, pat your feet and the areas between your toes until they are completely dry. Do not soak your feet as this can dry your skin.  Trim your toenails straight across. Do not dig under them or around the cuticle. File the edges of your nails with an emery board or nail file.  On the skin on your feet and on dry, brittle nails, apply a moisturizing lotion or petroleum jelly that is unscented and does not contain alcohol. Do not apply lotion between your toes. Shoes and Socks   Wear clean socks or stockings every day. Make sure they are not too tight.  Wear shoes that fit properly and have enough cushioning. To break in new shoes, wear them for just a few hours a day. This prevents you from injuring your feet. Always look in your shoes before you put them on to be sure there are no objects inside. Wounds, Scrapes, Corns, and Calluses  Check your feet daily for blisters, cuts, and redness. If you cannot see the bottom of your feet, use a mirror or ask someone for help.  Do not cut corns or calluses. Do not try to remove them with medicine.  If you find a minor scrape, cut, or break in the skin on your feet, keep it and the skin around it clean and dry. These areas may be cleaned with mild soap and water. Do not clean the area with peroxide, alcohol, or iodine.  If you have a wound, scrape, corn, or callus on your foot, look at it several times a day to make sure it is healing and is not infected. Check for: ? More redness, swelling, or  pain. ? More fluid or blood. ? Warmth. ? Pus or a bad smell. General Instructions  Do not cross your legs. That may decrease the blood flow to your feet.  Do not use heating pads or hot water bottles on your feet. They may burn your skin. If you have lost feeling in your feet or legs, you may not know it is happening until it is too late.  Make sure your health care provider does a complete foot exam at least annually or more often if you have foot problems. If you have foot problems, report any cuts, sores, or bruises to your health care provider immediately. Contact a health care provider if:  You have a medical condition that increases your risk of infection and you have any cuts, sores, or bruises on your feet.  You have an injury that is not healing.  You notice redness on your legs or feet.  You feel burning or tingling in your legs or feet.  You have pain or cramps in your legs or feet.  Your legs or feet are numb.  Your feet always feel cold.  You have pain around a toenail. Get help right away if:  You have a wound, scrape, corn, or callus on your foot and: ? You have more redness,  swelling, or pain. ? You have more fluid or blood. ? Your wound, scrape, corn, or callus feels warm to the touch. ? You have pus or a bad smell coming from the wound, scrape, corn, or callus. ? You have a fever.  You have a red line going up your leg. This information is not intended to replace advice given to you by your health care provider. Make sure you discuss any questions you have with your health care provider. Document Revised: 12/04/2016 Document Reviewed: 04/25/2016 Elsevier Patient Education  [place].

## 2020-06-21 NOTE — Progress Notes (Signed)
  Subjective:  Patient ID: Trevor [name], male    DOB: 02-Aug-1946,  MRN: [id number]  Chief Complaint  Patient presents with  . PAD    s/p AKA left  . Nail Problem    thick pianful toenails right    74 y.o. male presents with the above complaint. History confirmed with patient. He complains of subjective numbness in the right lower extremity.  Is been on for some well for him.  He recently saw his vascular surgeon's office who performed his left above-knee amputation. He saw the PA and is due to see Dr Oneida Alar today in the office. He is in a nursing home and says they take "OK" care of their feet.  Objective:  Physical Exam: Above-knee amputation on the left, right foot with chronic ischemic changes, no skin compromise at this time no ulceration, heel shin is intact, onychomycosis of toes 1 through 5 with gryphotic nails, dry skin, nonpalpable pedal pulses, toes are cold to touch, he has reduced sensation to the SW monofilament plantarly but I caught him peeking through his eyes so unclear how much is true sensation loss  Assessment:   1. PAD (peripheral artery disease) ([place])   2. Dry skin   3. Onychomycosis   4. Numbness in right leg   5. Above knee amputation of left lower extremity ([place])      Plan:  Patient was evaluated and treated and all questions answered.  -Recommend daily moisturizing lotion -Is unclear to me if he has any true neuropathy, or physical related to his vasculopathy.  He is due to see Dr. Oneida Alar today and hopefully this will provide some more insight. -Discussed the etiology and treatment options for the condition in detail with the patient. Educated patient on the topical and oral treatment options for mycotic nails. Recommended debridement of the nails today. Sharp and mechanical debridement performed of all painful and mycotic nails today. Nails debrided in length and thickness using a nail nipper and a mechanical burr to level of comfort. Discussed treatment  options including appropriate shoe gear. Follow up as needed for painful nails.  Lanae Crumbly, DPM 06/21/2020    Return in about 3 months (around 09/21/2020) for nail re-check.

## 2020-06-25 DIAGNOSIS — F4321 Adjustment disorder with depressed mood: Secondary | ICD-10-CM | POA: Diagnosis not present

## 2020-06-26 ENCOUNTER — Other Ambulatory Visit: Payer: Self-pay | Admitting: *Deleted

## 2020-06-26 DIAGNOSIS — I739 Peripheral vascular disease, unspecified: Secondary | ICD-10-CM

## 2020-07-09 ENCOUNTER — Encounter (HOSPITAL_COMMUNITY): Payer: Self-pay | Admitting: Student

## 2020-07-09 ENCOUNTER — Emergency Department (HOSPITAL_COMMUNITY): Payer: Medicare Other

## 2020-07-09 ENCOUNTER — Other Ambulatory Visit: Payer: Self-pay

## 2020-07-09 ENCOUNTER — Emergency Department (HOSPITAL_COMMUNITY)
Admission: EM | Admit: 2020-07-09 | Discharge: 2020-07-09 | Disposition: A | Discharge status: Home / Self Care | Payer: Medicare Other | Attending: Emergency Medicine | Admitting: Emergency Medicine

## 2020-07-09 DIAGNOSIS — M2550 Pain in unspecified joint: Secondary | ICD-10-CM | POA: Diagnosis not present

## 2020-07-09 DIAGNOSIS — J90 Pleural effusion, not elsewhere classified: Secondary | ICD-10-CM | POA: Diagnosis not present

## 2020-07-09 DIAGNOSIS — Z7401 Bed confinement status: Secondary | ICD-10-CM | POA: Diagnosis not present

## 2020-07-09 DIAGNOSIS — I517 Cardiomegaly: Secondary | ICD-10-CM | POA: Insufficient documentation

## 2020-07-09 DIAGNOSIS — R079 Chest pain, unspecified: Secondary | ICD-10-CM | POA: Diagnosis not present

## 2020-07-09 DIAGNOSIS — E1122 Type 2 diabetes mellitus with diabetic chronic kidney disease: Secondary | ICD-10-CM | POA: Insufficient documentation

## 2020-07-09 DIAGNOSIS — I10 Essential (primary) hypertension: Secondary | ICD-10-CM | POA: Diagnosis not present

## 2020-07-09 DIAGNOSIS — I7090 Unspecified atherosclerosis: Secondary | ICD-10-CM | POA: Diagnosis not present

## 2020-07-09 DIAGNOSIS — F17210 Nicotine dependence, cigarettes, uncomplicated: Secondary | ICD-10-CM | POA: Insufficient documentation

## 2020-07-09 DIAGNOSIS — R52 Pain, unspecified: Secondary | ICD-10-CM | POA: Diagnosis not present

## 2020-07-09 DIAGNOSIS — G9389 Other specified disorders of brain: Secondary | ICD-10-CM | POA: Diagnosis not present

## 2020-07-09 DIAGNOSIS — I6782 Cerebral ischemia: Secondary | ICD-10-CM | POA: Diagnosis not present

## 2020-07-09 DIAGNOSIS — G319 Degenerative disease of nervous system, unspecified: Secondary | ICD-10-CM | POA: Diagnosis not present

## 2020-07-09 DIAGNOSIS — I6389 Other cerebral infarction: Secondary | ICD-10-CM | POA: Diagnosis not present

## 2020-07-09 DIAGNOSIS — I700 Atherosclerosis of aorta: Secondary | ICD-10-CM | POA: Diagnosis not present

## 2020-07-09 DIAGNOSIS — R531 Weakness: Secondary | ICD-10-CM | POA: Insufficient documentation

## 2020-07-09 DIAGNOSIS — I469 Cardiac arrest, cause unspecified: Secondary | ICD-10-CM | POA: Diagnosis not present

## 2020-07-09 DIAGNOSIS — I639 Cerebral infarction, unspecified: Secondary | ICD-10-CM | POA: Diagnosis not present

## 2020-07-09 DIAGNOSIS — R0902 Hypoxemia: Secondary | ICD-10-CM | POA: Diagnosis not present

## 2020-07-09 DIAGNOSIS — N186 End stage renal disease: Secondary | ICD-10-CM | POA: Diagnosis not present

## 2020-07-09 DIAGNOSIS — R9082 White matter disease, unspecified: Secondary | ICD-10-CM | POA: Diagnosis not present

## 2020-07-09 DIAGNOSIS — R29810 Facial weakness: Secondary | ICD-10-CM | POA: Diagnosis not present

## 2020-07-09 DIAGNOSIS — I2510 Atherosclerotic heart disease of native coronary artery without angina pectoris: Secondary | ICD-10-CM | POA: Insufficient documentation

## 2020-07-09 DIAGNOSIS — I213 ST elevation (STEMI) myocardial infarction of unspecified site: Secondary | ICD-10-CM | POA: Diagnosis not present

## 2020-07-09 DIAGNOSIS — R000 Tachycardia, unspecified: Secondary | ICD-10-CM | POA: Insufficient documentation

## 2020-07-09 DIAGNOSIS — R4182 Altered mental status, unspecified: Secondary | ICD-10-CM | POA: Diagnosis not present

## 2020-07-09 LAB — DIFFERENTIAL
Abs Immature Granulocytes: 0.02 10*3/uL (ref 0.00–0.07)
Basophils Absolute: 0.1 10*3/uL (ref 0.0–0.1)
Basophils Relative: 2 %
Eosinophils Absolute: 0.2 10*3/uL (ref 0.0–0.5)
Eosinophils Relative: 5 %
Immature Granulocytes: 0 %
Lymphocytes Relative: 21 %
Lymphs Abs: 1 10*3/uL (ref 0.7–4.0)
Monocytes Absolute: 0.4 10*3/uL (ref 0.1–1.0)
Monocytes Relative: 9 %
Neutro Abs: 3.1 10*3/uL (ref 1.7–7.7)
Neutrophils Relative %: 63 %

## 2020-07-09 LAB — COMPREHENSIVE METABOLIC PANEL
ALT: 13 U/L (ref 0–44)
AST: 20 U/L (ref 15–41)
Albumin: 3.2 g/dL — ABNORMAL LOW (ref 3.5–5.0)
Alkaline Phosphatase: 143 U/L — ABNORMAL HIGH (ref 38–126)
Anion gap: 16 — ABNORMAL HIGH (ref 5–15)
BUN: 48 mg/dL — ABNORMAL HIGH (ref 8–23)
CO2: 29 mmol/L (ref 22–32)
Calcium: 8.9 mg/dL (ref 8.9–10.3)
Chloride: 95 mmol/L — ABNORMAL LOW (ref 98–111)
Creatinine, Ser: 7.35 mg/dL — ABNORMAL HIGH (ref 0.61–1.24)
GFR calc Af Amer: 8 mL/min — ABNORMAL LOW (ref >60)
GFR calc non Af Amer: 7 mL/min — ABNORMAL LOW (ref >60)
Glucose, Bld: 102 mg/dL — ABNORMAL HIGH (ref 70–99)
Potassium: 3.5 mmol/L (ref 3.5–5.1)
Sodium: 140 mmol/L (ref 135–145)
Total Bilirubin: 1.2 mg/dL (ref 0.3–1.2)
Total Protein: 6.8 g/dL (ref 6.5–8.1)

## 2020-07-09 LAB — CBC
HCT: 31.5 % — ABNORMAL LOW (ref 39.0–52.0)
Hemoglobin: 9.8 g/dL — ABNORMAL LOW (ref 13.0–17.0)
MCH: 28.8 pg (ref 26.0–34.0)
MCHC: 31.1 g/dL (ref 30.0–36.0)
MCV: 92.6 fL (ref 80.0–100.0)
Platelets: 138 10*3/uL — ABNORMAL LOW (ref 150–400)
RBC: 3.4 MIL/uL — ABNORMAL LOW (ref 4.22–5.81)
RDW: 16.1 % — ABNORMAL HIGH (ref 11.5–15.5)
WBC: 4.8 10*3/uL (ref 4.0–10.5)
nRBC: 0 % (ref 0.0–0.2)

## 2020-07-09 LAB — APTT: aPTT: 34 seconds (ref 24–36)

## 2020-07-09 LAB — ETHANOL: Alcohol, Ethyl (B): <10 mg/dL (ref <10)

## 2020-07-09 LAB — PROTIME-INR
INR: 1.2 (ref 0.8–1.2)
Prothrombin Time: 14.4 seconds (ref 11.4–15.2)

## 2020-07-09 LAB — TROPONIN I (HIGH SENSITIVITY)
Troponin I (High Sensitivity): 78 ng/L — ABNORMAL HIGH (ref <18)
Troponin I (High Sensitivity): 74 ng/L — ABNORMAL HIGH (ref <18)

## 2020-07-09 NOTE — ED Triage Notes (Signed)
Pt presents via EMS from [place] for stroke like symptoms this morning. Right side facial droop, but staff unable to tell EMS if that is from prior stroke. Staff unsure of deficits from prior stroke [facility] unknown, but pt thinks it may have been Friday. Pt reports "just not feeling right". He states he was unable to eat breakfast this morning because he was shaking.  Dialysis MWF- due today, unsure when his appointment is.  CBG 187 97.7 HR 90 BP 183/76 RR 16 94% spo2

## 2020-07-09 NOTE — ED Notes (Signed)
Patient verbalizes understanding of discharge instructions. Opportunity for questioning and answers were provided. Pt is now awaiting transport back to facility through [facility].

## 2020-07-09 NOTE — ED Provider Notes (Signed)
[facility] EMERGENCY DEPARTMENT Provider Note   CSN: [id number] Arrival date & time: 07/09/20  1132     History Chief Complaint  Patient presents with  . Weakness    Trevor [name] is a 74 y.o. male with a history of prior CVA, CAD, AAA, CHF, DM, ESRD on dialysis MWF, & afib who presents to the ED via EMS from [facility] for evaluation of "just not feeling right" x 3 days. Patient states he just does not feel right, he has a further time describing this, when asked specifically he does relay that he feels as though the left side of his body is weaker and a bit numb compared to the right side, but is not entirely sure. He had some trouble eating early, got very shaky, and had a short period of chest discomfort which is now resolved. He denies fever, chills, N/V, abdominal pain, syncope, or change in his speech. He has baseline visual deficits S/p CVA. Per EMS facility was unsure of last known normal, patient reports this was on Friday (07/06/20). Has not had dialysis today. He does not make urine.   HPI     Past Medical History:  Diagnosis Date  . AAA (abdominal aortic aneurysm) ([place])   . Anemia   . Anxiety   . Arthritis    arms and back  . Blindness   . CHF (congestive heart failure) ([place])   . Coronary artery disease    mild   . 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 Diabetes mellitus without complication ([place])   . ESRD on hemodialysis ([place])    started HD 2014-15  . Headache(784.0)   . HTN (hypertension)   . Protein-calorie malnutrition ([place])   . STEMI (ST elevation myocardial infarction) ([place]) 11/11/2018  . Stroke [facility]) 01/2018   no residual deficits    Patient Active Problem List   Diagnosis Date Noted  . Dyspnea 05/10/2019  . Anemia associated with chronic renal failure   . Abnormal CXR 02/28/2019  . Marijuana abuse 02/28/2019  . Congestive heart failure (CHF) ([place]) 02/28/2019  .  Cellulitis 02/28/2019  . Sepsis ([place]) 02/28/2019  . History of ST elevation myocardial infarction (STEMI) 11/11/2018  . Pulmonary edema 09/20/2018  . Acute respiratory failure with hypoxia ([place]) 03/29/2018  . Sleep stage dysfunction 11/10/2017  . 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 Vascular device, implant, or graft complication 00/34/9179  . Peripheral arterial disease ([place]) 10/20/2017  . Non-allergic rhinitis 10/20/2017  . 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 BPH (benign prostatic hyperplasia) 07/07/2017  . At risk for adverse drug reaction 07/02/2017  . Acute pulmonary edema ([place]) 06/27/2017  . Hypertensive urgency 01/24/2017  . 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 Ischemic chest pain ([place]) 11/01/2016  . Essential hypertension 03/08/2016  . Dyslipidemia 01/12/2016  . Chest pain 08/12/2015  . Accelerated hypertension 08/12/2015  . Carotid stenosis   . Abdominal aortic aneurysm ([place]) 03/06/2015  . Protein-calorie malnutrition, severe ([place]) 05/20/2014  . End-stage renal disease on hemodialysis ([place]) 05/19/2014  . Anemia of renal disease 05/19/2014  . Coronary artery disease, non-occlusive:  09/28/2012  . Secondary hyperparathyroidism ([place]) 09/27/2012  . Non compliance with medical treatment 09/27/2012  . Acute on chronic combined systolic and diastolic CHF (congestive heart failure) ([place]) 10/10/2011  . A-fib ([place]) 10/08/2011  . History of CVA (cerebrovascular accident) 10/07/2011  . Cocaine abuse ([place]) 10/07/2011  . Tobacco abuse 08/07/2011  .  Bruit 08/07/2011    Past Surgical History:  Procedure Laterality Date  . A/V FISTULAGRAM N/A 07/29/2018   Procedure: A/V FISTULAGRAM - left upper extremity;  Surgeon: Waynetta Sandy, MD;  Location: [facility] CV LAB;  Service: Cardiovascular;  Laterality: N/A;  . AMPUTATION Left 03/01/2019   Procedure: AMPUTATION ABOVE KNEE;  Surgeon: Angelia Mould, MD;  Location: [facility];  Service: Vascular;  Laterality: Left;   . AXILLARY-FEMORAL BYPASS GRAFT  06/28/2017   Procedure: BYPASS GRAFT RIGHT AXILLA-BIFEMORAL USING 8MM X 30CM AND 8MM X 60CM HEMASHIELD GOLD GRAFTS;  Surgeon: Rosetta Posner, MD;  Location: [facility];  Service: Vascular;;  . CARDIAC CATHETERIZATION N/A 11/01/2016   Procedure: Left Heart Cath and Coronary Angiography;  Surgeon: Lorretta Harp, MD;  Location: [facility] CV LAB;  Service: Cardiovascular;  Laterality: N/A;  . ENDARTERECTOMY FEMORAL Bilateral 06/28/2017   Procedure: ENDARTERECTOMY BILATERAL FEMORAL ARTERIES;  Surgeon: Rosetta Posner, MD;  Location: [facility];  Service: Vascular;  Laterality: Bilateral;  . FISTULA SUPERFICIALIZATION Left 4/33/2951   Procedure: PLICATION OF ARTERIOVENOUS FISTULA LEFT ARM;  Surgeon: Waynetta Sandy, MD;  Location: [facility];  Service: Vascular;  Laterality: Left;  . FISTULOGRAM Left 05/20/2018   Procedure: FISTULOGRAM WITH BALLOON ANGIOPLASTY LEFT ARM ARTERIOVENOUS FISTULA;  Surgeon: Waynetta Sandy, MD;  Location: [place];  Service: Vascular;  Laterality: Left;  . INSERTION OF DIALYSIS CATHETER  09/21/2012   Procedure: INSERTION OF DIALYSIS CATHETER;  Surgeon: Angelia Mould, MD;  Location: [place];  Service: Vascular;  Laterality: N/A;  Right Internal Jugular Placement  . LEFT HEART CATH N/A 11/11/2018   Procedure: LEFT HEART CATH;  Surgeon: Sherren Mocha, MD;  Location: [facility] CV LAB;  Service: Cardiovascular;  Laterality: N/A;  . LEFT HEART CATHETERIZATION WITH CORONARY ANGIOGRAM N/A 09/28/2012   Procedure: LEFT HEART CATHETERIZATION WITH CORONARY ANGIOGRAM;  Surgeon: Sherren Mocha, MD;  Location: [facility] CATH LAB;  Service: Cardiovascular;  Laterality: N/A;  . LEFT HEART CATHETERIZATION WITH CORONARY ANGIOGRAM N/A 07/07/2014   Procedure: LEFT HEART CATHETERIZATION WITH CORONARY ANGIOGRAM;  Surgeon: Leonie Man, MD;  Location: [facility] CATH LAB;  Service: Cardiovascular;  Laterality: N/A;  . PERIPHERAL VASCULAR BALLOON ANGIOPLASTY Left  07/29/2018   Procedure: PERIPHERAL VASCULAR BALLOON ANGIOPLASTY;  Surgeon: Waynetta Sandy, MD;  Location: [place] [facility] CV LAB;  Service: Cardiovascular;  Laterality: Left;  CENTRAL VEIN  . PERIPHERAL VASCULAR INTERVENTION Left 07/29/2018   Procedure: PERIPHERAL VASCULAR INTERVENTION;  Surgeon: Waynetta Sandy, MD;  Location: [facility] LAB;  Service: Cardiovascular;  Laterality: Left;  AXILLARY VEIN  . REVISION OF ARTERIOVENOUS GORETEX GRAFT Left 12/30/2018   Procedure: REVISION OF ARTERIOVENOUS FISTULA LEFT ARM;  Surgeon: Rosetta Posner, MD;  Location: [facility];  Service: Vascular;  Laterality: Left;  . REVISON OF ARTERIOVENOUS FISTULA Left 08/17/2018   Procedure: REVISION OF ARTERIOVENOUS FISTULA LEFT ARM;  Surgeon: Waynetta Sandy, MD;  Location: [facility];  Service: Vascular;  Laterality: Left;  . right hand    . TEE WITHOUT CARDIOVERSION  10/09/2011   Procedure: TRANSESOPHAGEAL ECHOCARDIOGRAM (TEE);  Surgeon: Lelon Perla, MD;  Location[facility] ENDOSCOPY;  Service: Cardiovascular;  Laterality: N/A;       Family History  Problem Relation Age of Onset  . Heart attack Mother        MI in her 61s  . Diabetes Mother   . Alcohol abuse Father   . Anesthesia problems Neg Hx   . Hypotension Neg Hx   .  Malignant hyperthermia Neg Hx   . Pseudochol deficiency Neg Hx     Social History   Tobacco Use  . Smoking status: Current Every Day Smoker    Packs/day: 0.50    Years: 60.00    Pack years: 30.00    Types: Cigarettes  . Smokeless tobacco: Never Used  . Tobacco comment: 5-6 cigarettes per day  Vaping Use  . Vaping Use: Never used  Substance Use Topics  . Alcohol use: No    Alcohol/week: 0.0 standard drinks    Comment: Occasional  . Drug use: No    Frequency: 2.0 times per week    Types: "Crack" cocaine    Home Medications Prior to Admission medications   Medication Sig Start Date End Date Taking? Authorizing Provider  aspirin EC 81 MG tablet Take 1  tablet (81 mg total) by mouth daily. 11/15/18   Bhagat, Crista Luria, PA  atorvastatin (LIPITOR) 80 MG tablet Take 1 tablet (80 mg total) by mouth at bedtime. 11/15/18   Bhagat, Crista Luria, PA  b complex-vitamin c-folic acid (NEPHRO-VITE) 0.8 MG TABS tablet Take 1 tablet by mouth daily.    [provider]  bisacodyl (DULCOLAX) 10 MG suppository Place 10 mg rectally daily as needed (constipation).     [provider]  cinacalcet (SENSIPAR) 90 MG tablet Take 90 mg by mouth daily with supper.    [provider]  clopidogrel (PLAVIX) 75 MG tablet Take 1 tablet (75 mg total) by mouth daily. 04/05/15   Barton Dubois, MD  docusate sodium (COLACE) 100 MG capsule Take 200 mg by mouth at bedtime.     [provider]  gabapentin (NEURONTIN) 300 MG capsule Take 1 capsule (300 mg total) by mouth at bedtime. 01/14/18   Colbert Ewing, MD  Glycopyrrolate 15.6 MCG CAPS Place 15.6 mg into inhaler and inhale 2 (two) times a day.    [provider]  Icosapent Ethyl (VASCEPA) 1 g CAPS Take 1 g by mouth 2 (two) times daily.     [provider]  isosorbide mononitrate (IMDUR) 30 MG 24 hr tablet Take 1 tablet (30 mg total) by mouth daily. 11/22/18 09/15/19  Erlene Quan, PA-C  Methoxy PEG-Epoetin Beta (MIRCERA IJ) Mircera 05/28/20 05/27/21  [provider]  metoprolol tartrate (LOPRESSOR) 25 MG tablet Take 0.5 tablets (12.5 mg total) by mouth 2 (two) times daily. 11/15/18   Bhagat, Crista Luria, PA  mirtazapine (REMERON) 15 MG tablet Take 15 mg by mouth at bedtime.     [provider]  nitroGLYCERIN (NITROSTAT) 0.4 MG SL tablet Place 1 tablet (0.4 mg total) under the tongue every 5 (five) minutes x 3 doses as needed for chest pain. 11/15/18   Leanor Kail, PA  Nutritional Supplements (ENSURE PO) Take 237 mLs by mouth 2 (two) times daily.     [provider]  Nutritional Supplements (FEEDING SUPPLEMENT, NEPRO CARB STEADY,) LIQD Take 237  mLs by mouth 2 (two) times daily between meals. 12/11/16   Geradine Girt, DO  Nutritional Supplements (PROMOD) LIQD Take 30 mLs by mouth 2 (two) times a day.    [provider]  ondansetron (ZOFRAN) 4 MG tablet Take 4 mg by mouth every 6 (six) hours as needed for nausea or vomiting.    [provider]  senna (SENOKOT) 8.6 MG TABS tablet Take 1 tablet by mouth daily.    [provider]  sevelamer carbonate (RENVELA) 800 MG tablet Take 1,600 mg by mouth 3 (three) times daily with  meals.  12/13/18   [provider]    Allergies    Patient has no known allergies.  Review of Systems   Review of Systems  Constitutional: Negative for chills and fever.  Respiratory: Negative for shortness of breath.   Cardiovascular: Positive for chest pain (resolved @ present).  Gastrointestinal: Negative for abdominal pain, diarrhea, nausea and vomiting.  Neurological: Positive for weakness and numbness. Negative for syncope.  All other systems reviewed and are negative.   Physical Exam Updated Vital Signs BP (!) 173/80 (BP Location: Right Arm)   Pulse 95   Temp 98.3 F (36.8 C) (Oral)   Resp 16   SpO2 94%   Physical Exam Vitals and nursing note reviewed.  Constitutional:      General: He is not in acute distress.    Appearance: He is not ill-appearing or toxic-appearing.  HENT:     Head: Normocephalic and atraumatic.  Eyes:     Pupils: Pupils are equal, round, and reactive to light.  Cardiovascular:     Rate and Rhythm: Normal rate and regular rhythm.  Pulmonary:     Effort: Pulmonary effort is normal. No respiratory distress.     Breath sounds: Normal breath sounds.  Abdominal:     General: There is no distension.     Palpations: Abdomen is soft.     Tenderness: There is no abdominal tenderness. There is no guarding or rebound.  Musculoskeletal:     Cervical back: Neck supple.     Comments: RLE BKA.   Skin:    General: Skin is warm and dry.    Neurological:     Mental Status: He is alert.     Comments: Alert. Speech relatively clear. PERRL. No obvious unilateral facial droop however patient having difficulty with full smile. Sensation grossly intact x 4. Poor grip strength bilaterally, no appreciable unilateral deficit. Trouble with finger to nose more so with the LUE. Patient unable to hold bilateral UEs up to evaluate for pronator drift.   Psychiatric:        Mood and Affect: Mood normal.    ED Results / Procedures / Treatments   Labs (all labs ordered are listed, but only abnormal results are displayed) Labs Reviewed  CBC - Abnormal; Notable for the following components:      Result Value   RBC 3.40 (*)    Hemoglobin 9.8 (*)    HCT 31.5 (*)    RDW 16.1 (*)    Platelets 138 (*)    All other components within normal limits  COMPREHENSIVE METABOLIC PANEL - Abnormal; Notable for the following components:   Chloride 95 (*)    Glucose, Bld 102 (*)    BUN 48 (*)    Creatinine, Ser 7.35 (*)    Albumin 3.2 (*)    Alkaline Phosphatase 143 (*)    GFR calc non Af Amer 7 (*)    GFR calc Af Amer 8 (*)    Anion gap 16 (*)    All other components within normal limits  TROPONIN I (HIGH SENSITIVITY) - Abnormal; Notable for the following components:   Troponin I (High Sensitivity) 78 (*)    All other components within normal limits  ETHANOL  PROTIME-INR  APTT  DIFFERENTIAL  TROPONIN I (HIGH SENSITIVITY)    EKG EKG Interpretation  Date/Time:  Monday July 09 2020 11:47:23 EDT Ventricular Rate:  93 PR Interval:    QRS Duration: 98 QT Interval:  346 QTC Calculation: 431 R Axis:  38 Text Interpretation: Sinus tachycardia Atrial premature complexes Probable left atrial enlargement LVH with secondary repolarization abnormality No significant change since 05/09/2019 Confirmed by Veryl Speak [phone]) on 07/09/2020 11:51:10 AM   Radiology CT HEAD WO CONTRAST  Result Date: 07/09/2020 CLINICAL DATA:  Right-sided facial droop  EXAM: CT HEAD WITHOUT CONTRAST TECHNIQUE: Contiguous axial images were obtained from the base of the skull through the vertex without intravenous contrast. COMPARISON:  2017 FINDINGS: Brain: There is no acute intracranial hemorrhage, mass effect, or edema. No new loss of gray-white differentiation. There are chronic infarcts of the parasagittal left frontal lobe and right occipital lobes. Additional patchy and confluent areas of hypoattenuation in the supratentorial white matter nonspecific but probably reflect moderate chronic microvascular ischemic changes. There is no extra-axial fluid collection. Prominence of the ventricles and sulci reflects generalized parenchymal volume loss. Vascular: There is atherosclerotic calcification at the skull base. Skull: Calvarium is unremarkable. Sinuses/Orbits: No acute finding. Other: None. IMPRESSION: No acute intracranial abnormality. Stable chronic findings detailed above. Electronically Signed   By: Macy Mis M.D.   On: 07/09/2020 12:52   MR Brain Wo Contrast (neuro protocol)  Result Date: 07/09/2020 CLINICAL DATA:  Right-sided facial droop. EXAM: MRI HEAD WITHOUT CONTRAST TECHNIQUE: Multiplanar, multiecho pulse sequences of the brain and surrounding structures were obtained without intravenous contrast. COMPARISON:  Head CT same day.  MRI 12/29/2015. FINDINGS: Brain: Diffusion imaging does not show any acute or subacute infarction. Chronic small-vessel ischemic changes affect pons. No focal cerebellar insult. Cerebral hemispheres show old infarction in the right PCA territory as seen previously, with atrophy, encephalomalacia and gliosis. Chronic small-vessel ischemic changes are present throughout the cerebral hemispheric white matter there is an old left frontal cortical and subcortical infarction. Small old right frontal cortical infarction. No mass lesion, hemorrhage, hydrocephalus or extra-axial collection. Some chronic hemosiderin deposition in the old  infarctions. Vascular: Major vessels at the base of the brain show flow. Skull and upper cervical spine: Negative Sinuses/Orbits: Clear/normal Other: None IMPRESSION: No acute finding. No significant change since 2017. Age related atrophy. Chronic small-vessel ischemic changes. Old larger vessel infarctions affecting the right PCA territory infarction and left frontal infarction. Electronically Signed   By: Nelson Chimes M.D.   On: 07/09/2020 14:18   DG Chest Portable 1 View  Result Date: 07/09/2020 CLINICAL DATA:  Chest pain.  Stroke symptoms. EXAM: PORTABLE CHEST 1 VIEW COMPARISON:  05/09/2019 FINDINGS: Midline trachea. Mild cardiomegaly. Atherosclerosis in the transverse aorta. Probable layering small left pleural effusion. No pneumothorax. Pulmonary interstitial prominence and indistinctness is decreased. Thickening of the right minor fissure. IMPRESSION: 1. Cardiomegaly mild interstitial edema. 2. Probable small left pleural effusion. 3.  Aortic Atherosclerosis (ICD10-I70.0). Electronically Signed   By: Abigail Miyamoto M.D.   On: 07/09/2020 12:21    Procedures Procedures (including critical care time)  Medications Ordered in ED Medications - No data to display  ED Course  I have reviewed the triage vital signs and the nursing notes.  Pertinent labs & imaging results that were available during my care of the patient were reviewed by me and considered in my medical decision making (see chart for details).  JEFFIE SPIVACK was evaluated in Emergency Department on 07/09/2020 for the symptoms described in the history of present illness. He/she was evaluated in the context of the global COVID-19 pandemic, which necessitated consideration that the patient might be at risk for infection with the SARS-CoV-2 virus that causes COVID-19. Institutional protocols and algorithms that pertain to the evaluation  of patients at risk for COVID-19 are in a state of rapid change based on information released by regulatory  bodies including the CDC and federal and state organizations. These policies and algorithms were followed during the patient's care in the ED.    MDM Rules/Calculators/A&P                         Patient presents to the ED with complaints of not feeling quite right, L sided weakness/numbness somewhat, and an episode of chest pain that has since resolved. Patient is nontoxic, resting comfortably, vitals w/ elevated BP otherwise WNL. On exam patient with some difficulty fully cooperating, seems to have some global weakness, increased difficulty with finger to nose with the LUE compared to the RUE. I do not appreciate an obvious facial droop, but difficult to assess smile.    Additional history obtained:  Additional history obtained from chart & nursing note review as well as EMS report. Previous records obtained and reviewed.   10/2018 L heart cath attempt: Unsuccessful attempts at cardiac catheterization from the left groin, right groin, and right radial artery.  Patient with severe peripheral arterial disease with previous axillary bifemoral grafting and severe diffuse atherosclerosis with left arm hemodialysis access. Recommend medical therapy.  Discussed with patient's daughter.  EKG: No significant change when compared to prior.  Lab Tests:  I Ordered, reviewed, and interpreted labs, which included:  CBC: Anemia fairly similar to prior.  No leukocytosis. CMP: Consistent with ESRD, potassium is within normal limits. APTT/PT/INR: Within normal limits Ethanol level: Within normal limits Troponin: Elevated at 78 in the setting of ESRD.  Imaging Studies ordered:  I ordered imaging studies which included CXR, CT head, & MRI of the brain, I independently visualized and interpreted imaging.  CT head: No acute intracranial abnormality. Stable chronic findings detailed above CXR: 1. Cardiomegaly mild interstitial edema. 2. Probable small left pleural effusion. 3.  Aortic Atherosclerosis  MRI  brain: No acute finding. No significant change since 2017. Age related atrophy. Chronic small-vessel ischemic changes. Old larger vessel infarctions affecting the right PCA territory infarction and left frontal infarction.  Patient missed dialysis today, his potassium is within normal limits, while he does have some mild interstitial edema on chest x-ray he does not have any evidence of respiratory distress or complaints of shortness of breath on exam, he does not appear to require emergent dialysis in the emergency department, feel be reasonable for him to be dialyzed tomorrow, I called and discussed with Pam who is on-call as the renal navigator-she has discussed with his dialysis center, [facility], they will dialyze him tomorrow at noon pending he is discharged from the ED today- this was relayed to [facility] staff- @ 15:15-he relays that patient has not been complaining over the weekend of sxs that he was aware of, he states today he was complaining of weakness/abnormal sensation to the L side and they were concerned about possible facial droop which prompted ER visit, he denied any chest pain to staff, and he did not having any shaking episodes per their observation/evaluation. Patient's labs are overall reassuring.  While his troponin is elevated it is flat and he has no significant changes on his EKG, his chest pain seems nonspecific and has not reoccurred in the ED, and his delta is downtrending, low suspicion for ACS. Patient overall appears appropriate for discharge home at this time given his reassuring ED work-up as well as scheduled dialysis  tomorrow. I discussed results, treatment plan, need for follow-up, and return precautions with the patient. Provided opportunity for questions, patient confirmed understanding and is in agreement with plan.   Findings and plan of care discussed with supervising physician Dr. Stark Jock who has evaluated the patient as a shared visit & is  in agreement.   Portions of this note were generated with [employer]. Dictation errors may occur despite best attempts at proofreading.  Final Clinical Impression(s) / ED Diagnoses Final diagnoses:  Weakness    Rx / DC Orders ED Discharge Orders    None       Amaryllis Dyke, PA-C 07/09/20 1521    Veryl Speak, MD 07/10/20 [phone]

## 2020-07-09 NOTE — ED Notes (Signed)
PTAR at Bedside; this RN did not witness this pt have his IV taken out by ED Staff or PTAR. ED Staff did not takes patients IV out nor were VS obtained. This RN also did not obtain VS nor is anybody aware if PTAR did.

## 2020-07-09 NOTE — Discharge Instructions (Signed)
You were seen in the emergency department today for weakness, not feeling right, and chest pain.  Your work-up showed that your labs were fairly similar to prior blood work you have had done.  Your MRI did not show signs of an acute stroke.  It did show findings of your previous strokes.  Your heart enzymes and EKG states similar to prior therefore we feel it is less likely that you are having an acute heart attack.  Given that you missed dialysis today we have called our dialysis team and have coordinated for you to have dialysis at the [facility] site that you typically go to at [facility] tomorrow at 12PM.  Please be sure to go to this appointment.   We would also like you to follow-up with your primary care provider within 3 days for reevaluation of your symptoms.  Return to the ER for new or worsening symptoms including but not limited to fever, trouble breathing, new numbness/weakness, new change in your vision, dizziness, chest pain returning, or any other concerns.

## 2020-07-09 NOTE — ED Notes (Signed)
Pt provided with PO Intake.

## 2020-07-25 DIAGNOSIS — F4321 Adjustment disorder with depressed mood: Secondary | ICD-10-CM | POA: Diagnosis not present

## 2020-08-10 DIAGNOSIS — M6281 Muscle weakness (generalized): Secondary | ICD-10-CM | POA: Diagnosis not present

## 2020-08-10 DIAGNOSIS — R2681 Unsteadiness on feet: Secondary | ICD-10-CM | POA: Diagnosis not present

## 2020-08-10 DIAGNOSIS — I639 Cerebral infarction, unspecified: Secondary | ICD-10-CM | POA: Diagnosis not present

## 2020-08-10 DIAGNOSIS — N186 End stage renal disease: Secondary | ICD-10-CM | POA: Diagnosis not present

## 2020-08-10 DIAGNOSIS — E1151 Type 2 diabetes mellitus with diabetic peripheral angiopathy without gangrene: Secondary | ICD-10-CM | POA: Diagnosis not present

## 2020-08-10 DIAGNOSIS — R633 Feeding difficulties: Secondary | ICD-10-CM | POA: Diagnosis not present

## 2020-08-13 DIAGNOSIS — I639 Cerebral infarction, unspecified: Secondary | ICD-10-CM | POA: Diagnosis not present

## 2020-08-13 DIAGNOSIS — R2681 Unsteadiness on feet: Secondary | ICD-10-CM | POA: Diagnosis not present

## 2020-08-13 DIAGNOSIS — M6281 Muscle weakness (generalized): Secondary | ICD-10-CM | POA: Diagnosis not present

## 2020-08-13 DIAGNOSIS — E1151 Type 2 diabetes mellitus with diabetic peripheral angiopathy without gangrene: Secondary | ICD-10-CM | POA: Diagnosis not present

## 2020-08-13 DIAGNOSIS — R633 Feeding difficulties: Secondary | ICD-10-CM | POA: Diagnosis not present

## 2020-08-13 DIAGNOSIS — N186 End stage renal disease: Secondary | ICD-10-CM | POA: Diagnosis not present

## 2020-08-14 DIAGNOSIS — R2681 Unsteadiness on feet: Secondary | ICD-10-CM | POA: Diagnosis not present

## 2020-08-14 DIAGNOSIS — E785 Hyperlipidemia, unspecified: Secondary | ICD-10-CM | POA: Diagnosis not present

## 2020-08-14 DIAGNOSIS — M6281 Muscle weakness (generalized): Secondary | ICD-10-CM | POA: Diagnosis not present

## 2020-08-14 DIAGNOSIS — I639 Cerebral infarction, unspecified: Secondary | ICD-10-CM | POA: Diagnosis not present

## 2020-08-14 DIAGNOSIS — N186 End stage renal disease: Secondary | ICD-10-CM | POA: Diagnosis not present

## 2020-08-14 DIAGNOSIS — R633 Feeding difficulties: Secondary | ICD-10-CM | POA: Diagnosis not present

## 2020-08-14 DIAGNOSIS — E1151 Type 2 diabetes mellitus with diabetic peripheral angiopathy without gangrene: Secondary | ICD-10-CM | POA: Diagnosis not present

## 2020-08-14 DIAGNOSIS — K5900 Constipation, unspecified: Secondary | ICD-10-CM | POA: Diagnosis not present

## 2020-08-14 DIAGNOSIS — E559 Vitamin D deficiency, unspecified: Secondary | ICD-10-CM | POA: Diagnosis not present

## 2020-08-15 DIAGNOSIS — N186 End stage renal disease: Secondary | ICD-10-CM | POA: Diagnosis not present

## 2020-08-15 DIAGNOSIS — I639 Cerebral infarction, unspecified: Secondary | ICD-10-CM | POA: Diagnosis not present

## 2020-08-15 DIAGNOSIS — E1151 Type 2 diabetes mellitus with diabetic peripheral angiopathy without gangrene: Secondary | ICD-10-CM | POA: Diagnosis not present

## 2020-08-15 DIAGNOSIS — R2681 Unsteadiness on feet: Secondary | ICD-10-CM | POA: Diagnosis not present

## 2020-08-15 DIAGNOSIS — R633 Feeding difficulties: Secondary | ICD-10-CM | POA: Diagnosis not present

## 2020-08-15 DIAGNOSIS — M6281 Muscle weakness (generalized): Secondary | ICD-10-CM | POA: Diagnosis not present

## 2020-08-16 DIAGNOSIS — R2681 Unsteadiness on feet: Secondary | ICD-10-CM | POA: Diagnosis not present

## 2020-08-16 DIAGNOSIS — I639 Cerebral infarction, unspecified: Secondary | ICD-10-CM | POA: Diagnosis not present

## 2020-08-16 DIAGNOSIS — E1151 Type 2 diabetes mellitus with diabetic peripheral angiopathy without gangrene: Secondary | ICD-10-CM | POA: Diagnosis not present

## 2020-08-16 DIAGNOSIS — M6281 Muscle weakness (generalized): Secondary | ICD-10-CM | POA: Diagnosis not present

## 2020-08-16 DIAGNOSIS — R633 Feeding difficulties: Secondary | ICD-10-CM | POA: Diagnosis not present

## 2020-08-16 DIAGNOSIS — F4320 Adjustment disorder, unspecified: Secondary | ICD-10-CM | POA: Diagnosis not present

## 2020-08-16 DIAGNOSIS — N186 End stage renal disease: Secondary | ICD-10-CM | POA: Diagnosis not present

## 2020-08-18 DIAGNOSIS — I639 Cerebral infarction, unspecified: Secondary | ICD-10-CM | POA: Diagnosis not present

## 2020-08-18 DIAGNOSIS — N186 End stage renal disease: Secondary | ICD-10-CM | POA: Diagnosis not present

## 2020-08-18 DIAGNOSIS — M6281 Muscle weakness (generalized): Secondary | ICD-10-CM | POA: Diagnosis not present

## 2020-08-18 DIAGNOSIS — R2681 Unsteadiness on feet: Secondary | ICD-10-CM | POA: Diagnosis not present

## 2020-08-18 DIAGNOSIS — E1151 Type 2 diabetes mellitus with diabetic peripheral angiopathy without gangrene: Secondary | ICD-10-CM | POA: Diagnosis not present

## 2020-08-18 DIAGNOSIS — R633 Feeding difficulties: Secondary | ICD-10-CM | POA: Diagnosis not present

## 2020-08-20 DIAGNOSIS — N186 End stage renal disease: Secondary | ICD-10-CM | POA: Diagnosis not present

## 2020-08-20 DIAGNOSIS — M6281 Muscle weakness (generalized): Secondary | ICD-10-CM | POA: Diagnosis not present

## 2020-08-20 DIAGNOSIS — I639 Cerebral infarction, unspecified: Secondary | ICD-10-CM | POA: Diagnosis not present

## 2020-08-20 DIAGNOSIS — R2681 Unsteadiness on feet: Secondary | ICD-10-CM | POA: Diagnosis not present

## 2020-08-20 DIAGNOSIS — R633 Feeding difficulties: Secondary | ICD-10-CM | POA: Diagnosis not present

## 2020-08-20 DIAGNOSIS — E1151 Type 2 diabetes mellitus with diabetic peripheral angiopathy without gangrene: Secondary | ICD-10-CM | POA: Diagnosis not present

## 2020-08-21 DIAGNOSIS — E1151 Type 2 diabetes mellitus with diabetic peripheral angiopathy without gangrene: Secondary | ICD-10-CM | POA: Diagnosis not present

## 2020-08-21 DIAGNOSIS — N186 End stage renal disease: Secondary | ICD-10-CM | POA: Diagnosis not present

## 2020-08-21 DIAGNOSIS — I639 Cerebral infarction, unspecified: Secondary | ICD-10-CM | POA: Diagnosis not present

## 2020-08-21 DIAGNOSIS — R2681 Unsteadiness on feet: Secondary | ICD-10-CM | POA: Diagnosis not present

## 2020-08-21 DIAGNOSIS — M6281 Muscle weakness (generalized): Secondary | ICD-10-CM | POA: Diagnosis not present

## 2020-08-21 DIAGNOSIS — R633 Feeding difficulties: Secondary | ICD-10-CM | POA: Diagnosis not present

## 2020-08-23 DIAGNOSIS — M6281 Muscle weakness (generalized): Secondary | ICD-10-CM | POA: Diagnosis not present

## 2020-08-23 DIAGNOSIS — F4321 Adjustment disorder with depressed mood: Secondary | ICD-10-CM | POA: Diagnosis not present

## 2020-08-23 DIAGNOSIS — I639 Cerebral infarction, unspecified: Secondary | ICD-10-CM | POA: Diagnosis not present

## 2020-08-23 DIAGNOSIS — R633 Feeding difficulties: Secondary | ICD-10-CM | POA: Diagnosis not present

## 2020-08-23 DIAGNOSIS — R2681 Unsteadiness on feet: Secondary | ICD-10-CM | POA: Diagnosis not present

## 2020-08-23 DIAGNOSIS — N186 End stage renal disease: Secondary | ICD-10-CM | POA: Diagnosis not present

## 2020-08-23 DIAGNOSIS — E1151 Type 2 diabetes mellitus with diabetic peripheral angiopathy without gangrene: Secondary | ICD-10-CM | POA: Diagnosis not present

## 2020-08-28 DIAGNOSIS — N186 End stage renal disease: Secondary | ICD-10-CM | POA: Diagnosis not present

## 2020-08-28 DIAGNOSIS — I639 Cerebral infarction, unspecified: Secondary | ICD-10-CM | POA: Diagnosis not present

## 2020-08-28 DIAGNOSIS — E1151 Type 2 diabetes mellitus with diabetic peripheral angiopathy without gangrene: Secondary | ICD-10-CM | POA: Diagnosis not present

## 2020-08-28 DIAGNOSIS — M6281 Muscle weakness (generalized): Secondary | ICD-10-CM | POA: Diagnosis not present

## 2020-08-28 DIAGNOSIS — R2681 Unsteadiness on feet: Secondary | ICD-10-CM | POA: Diagnosis not present

## 2020-08-28 DIAGNOSIS — R633 Feeding difficulties: Secondary | ICD-10-CM | POA: Diagnosis not present

## 2020-08-30 DIAGNOSIS — M6281 Muscle weakness (generalized): Secondary | ICD-10-CM | POA: Diagnosis not present

## 2020-08-30 DIAGNOSIS — I639 Cerebral infarction, unspecified: Secondary | ICD-10-CM | POA: Diagnosis not present

## 2020-08-30 DIAGNOSIS — R2681 Unsteadiness on feet: Secondary | ICD-10-CM | POA: Diagnosis not present

## 2020-08-30 DIAGNOSIS — R633 Feeding difficulties: Secondary | ICD-10-CM | POA: Diagnosis not present

## 2020-08-30 DIAGNOSIS — E1151 Type 2 diabetes mellitus with diabetic peripheral angiopathy without gangrene: Secondary | ICD-10-CM | POA: Diagnosis not present

## 2020-08-30 DIAGNOSIS — N186 End stage renal disease: Secondary | ICD-10-CM | POA: Diagnosis not present

## 2020-08-31 DIAGNOSIS — R633 Feeding difficulties, unspecified: Secondary | ICD-10-CM | POA: Diagnosis not present

## 2020-08-31 DIAGNOSIS — E1151 Type 2 diabetes mellitus with diabetic peripheral angiopathy without gangrene: Secondary | ICD-10-CM | POA: Diagnosis not present

## 2020-08-31 DIAGNOSIS — R2681 Unsteadiness on feet: Secondary | ICD-10-CM | POA: Diagnosis not present

## 2020-08-31 DIAGNOSIS — M6281 Muscle weakness (generalized): Secondary | ICD-10-CM | POA: Diagnosis not present

## 2020-08-31 DIAGNOSIS — N186 End stage renal disease: Secondary | ICD-10-CM | POA: Diagnosis not present

## 2020-08-31 DIAGNOSIS — I639 Cerebral infarction, unspecified: Secondary | ICD-10-CM | POA: Diagnosis not present

## 2020-09-01 DIAGNOSIS — E1151 Type 2 diabetes mellitus with diabetic peripheral angiopathy without gangrene: Secondary | ICD-10-CM | POA: Diagnosis not present

## 2020-09-01 DIAGNOSIS — R633 Feeding difficulties, unspecified: Secondary | ICD-10-CM | POA: Diagnosis not present

## 2020-09-01 DIAGNOSIS — I639 Cerebral infarction, unspecified: Secondary | ICD-10-CM | POA: Diagnosis not present

## 2020-09-01 DIAGNOSIS — M6281 Muscle weakness (generalized): Secondary | ICD-10-CM | POA: Diagnosis not present

## 2020-09-01 DIAGNOSIS — R2681 Unsteadiness on feet: Secondary | ICD-10-CM | POA: Diagnosis not present

## 2020-09-01 DIAGNOSIS — N186 End stage renal disease: Secondary | ICD-10-CM | POA: Diagnosis not present

## 2020-09-03 DIAGNOSIS — R2681 Unsteadiness on feet: Secondary | ICD-10-CM | POA: Diagnosis not present

## 2020-09-03 DIAGNOSIS — M6281 Muscle weakness (generalized): Secondary | ICD-10-CM | POA: Diagnosis not present

## 2020-09-03 DIAGNOSIS — I639 Cerebral infarction, unspecified: Secondary | ICD-10-CM | POA: Diagnosis not present

## 2020-09-03 DIAGNOSIS — E1151 Type 2 diabetes mellitus with diabetic peripheral angiopathy without gangrene: Secondary | ICD-10-CM | POA: Diagnosis not present

## 2020-09-03 DIAGNOSIS — N186 End stage renal disease: Secondary | ICD-10-CM | POA: Diagnosis not present

## 2020-09-03 DIAGNOSIS — R633 Feeding difficulties, unspecified: Secondary | ICD-10-CM | POA: Diagnosis not present

## 2020-09-04 DIAGNOSIS — R2681 Unsteadiness on feet: Secondary | ICD-10-CM | POA: Diagnosis not present

## 2020-09-04 DIAGNOSIS — E1151 Type 2 diabetes mellitus with diabetic peripheral angiopathy without gangrene: Secondary | ICD-10-CM | POA: Diagnosis not present

## 2020-09-04 DIAGNOSIS — N186 End stage renal disease: Secondary | ICD-10-CM | POA: Diagnosis not present

## 2020-09-04 DIAGNOSIS — R633 Feeding difficulties, unspecified: Secondary | ICD-10-CM | POA: Diagnosis not present

## 2020-09-04 DIAGNOSIS — F4321 Adjustment disorder with depressed mood: Secondary | ICD-10-CM | POA: Diagnosis not present

## 2020-09-04 DIAGNOSIS — I639 Cerebral infarction, unspecified: Secondary | ICD-10-CM | POA: Diagnosis not present

## 2020-09-04 DIAGNOSIS — M6281 Muscle weakness (generalized): Secondary | ICD-10-CM | POA: Diagnosis not present

## 2020-09-06 DIAGNOSIS — I639 Cerebral infarction, unspecified: Secondary | ICD-10-CM | POA: Diagnosis not present

## 2020-09-06 DIAGNOSIS — N186 End stage renal disease: Secondary | ICD-10-CM | POA: Diagnosis not present

## 2020-09-06 DIAGNOSIS — M6281 Muscle weakness (generalized): Secondary | ICD-10-CM | POA: Diagnosis not present

## 2020-09-06 DIAGNOSIS — E1151 Type 2 diabetes mellitus with diabetic peripheral angiopathy without gangrene: Secondary | ICD-10-CM | POA: Diagnosis not present

## 2020-09-06 DIAGNOSIS — R2681 Unsteadiness on feet: Secondary | ICD-10-CM | POA: Diagnosis not present

## 2020-09-06 DIAGNOSIS — R633 Feeding difficulties, unspecified: Secondary | ICD-10-CM | POA: Diagnosis not present

## 2020-09-07 DIAGNOSIS — I639 Cerebral infarction, unspecified: Secondary | ICD-10-CM | POA: Diagnosis not present

## 2020-09-07 DIAGNOSIS — R633 Feeding difficulties, unspecified: Secondary | ICD-10-CM | POA: Diagnosis not present

## 2020-09-07 DIAGNOSIS — R2681 Unsteadiness on feet: Secondary | ICD-10-CM | POA: Diagnosis not present

## 2020-09-07 DIAGNOSIS — M6281 Muscle weakness (generalized): Secondary | ICD-10-CM | POA: Diagnosis not present

## 2020-09-07 DIAGNOSIS — E1151 Type 2 diabetes mellitus with diabetic peripheral angiopathy without gangrene: Secondary | ICD-10-CM | POA: Diagnosis not present

## 2020-09-07 DIAGNOSIS — N186 End stage renal disease: Secondary | ICD-10-CM | POA: Diagnosis not present

## 2020-09-10 DIAGNOSIS — M6281 Muscle weakness (generalized): Secondary | ICD-10-CM | POA: Diagnosis not present

## 2020-09-10 DIAGNOSIS — N186 End stage renal disease: Secondary | ICD-10-CM | POA: Diagnosis not present

## 2020-09-10 DIAGNOSIS — E1151 Type 2 diabetes mellitus with diabetic peripheral angiopathy without gangrene: Secondary | ICD-10-CM | POA: Diagnosis not present

## 2020-09-10 DIAGNOSIS — R633 Feeding difficulties, unspecified: Secondary | ICD-10-CM | POA: Diagnosis not present

## 2020-09-10 DIAGNOSIS — I639 Cerebral infarction, unspecified: Secondary | ICD-10-CM | POA: Diagnosis not present

## 2020-09-10 DIAGNOSIS — R2681 Unsteadiness on feet: Secondary | ICD-10-CM | POA: Diagnosis not present

## 2020-09-11 DIAGNOSIS — F4320 Adjustment disorder, unspecified: Secondary | ICD-10-CM | POA: Diagnosis not present

## 2020-09-13 DIAGNOSIS — M6281 Muscle weakness (generalized): Secondary | ICD-10-CM | POA: Diagnosis not present

## 2020-09-13 DIAGNOSIS — R2681 Unsteadiness on feet: Secondary | ICD-10-CM | POA: Diagnosis not present

## 2020-09-13 DIAGNOSIS — I639 Cerebral infarction, unspecified: Secondary | ICD-10-CM | POA: Diagnosis not present

## 2020-09-13 DIAGNOSIS — N186 End stage renal disease: Secondary | ICD-10-CM | POA: Diagnosis not present

## 2020-09-13 DIAGNOSIS — R633 Feeding difficulties, unspecified: Secondary | ICD-10-CM | POA: Diagnosis not present

## 2020-09-13 DIAGNOSIS — E1151 Type 2 diabetes mellitus with diabetic peripheral angiopathy without gangrene: Secondary | ICD-10-CM | POA: Diagnosis not present

## 2020-09-18 DIAGNOSIS — F4321 Adjustment disorder with depressed mood: Secondary | ICD-10-CM | POA: Diagnosis not present

## 2020-09-19 DIAGNOSIS — N186 End stage renal disease: Secondary | ICD-10-CM | POA: Diagnosis not present

## 2020-09-19 DIAGNOSIS — E1151 Type 2 diabetes mellitus with diabetic peripheral angiopathy without gangrene: Secondary | ICD-10-CM | POA: Diagnosis not present

## 2020-09-19 DIAGNOSIS — R2681 Unsteadiness on feet: Secondary | ICD-10-CM | POA: Diagnosis not present

## 2020-09-19 DIAGNOSIS — R633 Feeding difficulties, unspecified: Secondary | ICD-10-CM | POA: Diagnosis not present

## 2020-09-19 DIAGNOSIS — I639 Cerebral infarction, unspecified: Secondary | ICD-10-CM | POA: Diagnosis not present

## 2020-09-19 DIAGNOSIS — M6281 Muscle weakness (generalized): Secondary | ICD-10-CM | POA: Diagnosis not present

## 2020-09-24 DIAGNOSIS — I639 Cerebral infarction, unspecified: Secondary | ICD-10-CM | POA: Diagnosis not present

## 2020-09-24 DIAGNOSIS — R633 Feeding difficulties, unspecified: Secondary | ICD-10-CM | POA: Diagnosis not present

## 2020-09-24 DIAGNOSIS — R2681 Unsteadiness on feet: Secondary | ICD-10-CM | POA: Diagnosis not present

## 2020-09-24 DIAGNOSIS — N186 End stage renal disease: Secondary | ICD-10-CM | POA: Diagnosis not present

## 2020-09-24 DIAGNOSIS — E1151 Type 2 diabetes mellitus with diabetic peripheral angiopathy without gangrene: Secondary | ICD-10-CM | POA: Diagnosis not present

## 2020-09-24 DIAGNOSIS — M6281 Muscle weakness (generalized): Secondary | ICD-10-CM | POA: Diagnosis not present

## 2020-09-26 DIAGNOSIS — I639 Cerebral infarction, unspecified: Secondary | ICD-10-CM | POA: Diagnosis not present

## 2020-09-26 DIAGNOSIS — R633 Feeding difficulties, unspecified: Secondary | ICD-10-CM | POA: Diagnosis not present

## 2020-09-26 DIAGNOSIS — M6281 Muscle weakness (generalized): Secondary | ICD-10-CM | POA: Diagnosis not present

## 2020-09-26 DIAGNOSIS — N186 End stage renal disease: Secondary | ICD-10-CM | POA: Diagnosis not present

## 2020-09-26 DIAGNOSIS — R2681 Unsteadiness on feet: Secondary | ICD-10-CM | POA: Diagnosis not present

## 2020-09-26 DIAGNOSIS — E1151 Type 2 diabetes mellitus with diabetic peripheral angiopathy without gangrene: Secondary | ICD-10-CM | POA: Diagnosis not present

## 2020-10-02 DIAGNOSIS — I639 Cerebral infarction, unspecified: Secondary | ICD-10-CM | POA: Diagnosis not present

## 2020-10-02 DIAGNOSIS — M6281 Muscle weakness (generalized): Secondary | ICD-10-CM | POA: Diagnosis not present

## 2020-10-02 DIAGNOSIS — R2681 Unsteadiness on feet: Secondary | ICD-10-CM | POA: Diagnosis not present

## 2020-10-02 DIAGNOSIS — E1151 Type 2 diabetes mellitus with diabetic peripheral angiopathy without gangrene: Secondary | ICD-10-CM | POA: Diagnosis not present

## 2020-10-02 DIAGNOSIS — N186 End stage renal disease: Secondary | ICD-10-CM | POA: Diagnosis not present

## 2020-10-02 DIAGNOSIS — R633 Feeding difficulties, unspecified: Secondary | ICD-10-CM | POA: Diagnosis not present

## 2020-10-04 DIAGNOSIS — E1151 Type 2 diabetes mellitus with diabetic peripheral angiopathy without gangrene: Secondary | ICD-10-CM | POA: Diagnosis not present

## 2020-10-04 DIAGNOSIS — M6281 Muscle weakness (generalized): Secondary | ICD-10-CM | POA: Diagnosis not present

## 2020-10-04 DIAGNOSIS — N186 End stage renal disease: Secondary | ICD-10-CM | POA: Diagnosis not present

## 2020-10-04 DIAGNOSIS — R633 Feeding difficulties, unspecified: Secondary | ICD-10-CM | POA: Diagnosis not present

## 2020-10-04 DIAGNOSIS — Z23 Encounter for immunization: Secondary | ICD-10-CM | POA: Diagnosis not present

## 2020-10-04 DIAGNOSIS — I639 Cerebral infarction, unspecified: Secondary | ICD-10-CM | POA: Diagnosis not present

## 2020-10-04 DIAGNOSIS — R2681 Unsteadiness on feet: Secondary | ICD-10-CM | POA: Diagnosis not present

## 2020-10-05 DIAGNOSIS — R633 Feeding difficulties, unspecified: Secondary | ICD-10-CM | POA: Diagnosis not present

## 2020-10-05 DIAGNOSIS — E1151 Type 2 diabetes mellitus with diabetic peripheral angiopathy without gangrene: Secondary | ICD-10-CM | POA: Diagnosis not present

## 2020-10-05 DIAGNOSIS — M6281 Muscle weakness (generalized): Secondary | ICD-10-CM | POA: Diagnosis not present

## 2020-10-05 DIAGNOSIS — I639 Cerebral infarction, unspecified: Secondary | ICD-10-CM | POA: Diagnosis not present

## 2020-10-05 DIAGNOSIS — R2681 Unsteadiness on feet: Secondary | ICD-10-CM | POA: Diagnosis not present

## 2020-10-05 DIAGNOSIS — N186 End stage renal disease: Secondary | ICD-10-CM | POA: Diagnosis not present

## 2020-10-08 DIAGNOSIS — E785 Hyperlipidemia, unspecified: Secondary | ICD-10-CM | POA: Diagnosis not present

## 2020-10-08 DIAGNOSIS — E1151 Type 2 diabetes mellitus with diabetic peripheral angiopathy without gangrene: Secondary | ICD-10-CM | POA: Diagnosis not present

## 2020-10-08 DIAGNOSIS — D649 Anemia, unspecified: Secondary | ICD-10-CM | POA: Diagnosis not present

## 2020-10-08 DIAGNOSIS — Z992 Dependence on renal dialysis: Secondary | ICD-10-CM | POA: Diagnosis not present

## 2020-10-08 DIAGNOSIS — N186 End stage renal disease: Secondary | ICD-10-CM | POA: Diagnosis not present

## 2020-10-08 DIAGNOSIS — J449 Chronic obstructive pulmonary disease, unspecified: Secondary | ICD-10-CM | POA: Diagnosis not present

## 2020-10-08 DIAGNOSIS — I739 Peripheral vascular disease, unspecified: Secondary | ICD-10-CM | POA: Diagnosis not present

## 2020-10-08 DIAGNOSIS — I679 Cerebrovascular disease, unspecified: Secondary | ICD-10-CM | POA: Diagnosis not present

## 2020-10-09 DIAGNOSIS — E1151 Type 2 diabetes mellitus with diabetic peripheral angiopathy without gangrene: Secondary | ICD-10-CM | POA: Diagnosis not present

## 2020-10-09 DIAGNOSIS — N186 End stage renal disease: Secondary | ICD-10-CM | POA: Diagnosis not present

## 2020-10-09 DIAGNOSIS — R2681 Unsteadiness on feet: Secondary | ICD-10-CM | POA: Diagnosis not present

## 2020-10-09 DIAGNOSIS — M6281 Muscle weakness (generalized): Secondary | ICD-10-CM | POA: Diagnosis not present

## 2020-10-09 DIAGNOSIS — R633 Feeding difficulties, unspecified: Secondary | ICD-10-CM | POA: Diagnosis not present

## 2020-10-09 DIAGNOSIS — I639 Cerebral infarction, unspecified: Secondary | ICD-10-CM | POA: Diagnosis not present

## 2020-10-10 DIAGNOSIS — E1151 Type 2 diabetes mellitus with diabetic peripheral angiopathy without gangrene: Secondary | ICD-10-CM | POA: Diagnosis not present

## 2020-10-10 DIAGNOSIS — N186 End stage renal disease: Secondary | ICD-10-CM | POA: Diagnosis not present

## 2020-10-10 DIAGNOSIS — R633 Feeding difficulties, unspecified: Secondary | ICD-10-CM | POA: Diagnosis not present

## 2020-10-10 DIAGNOSIS — R2681 Unsteadiness on feet: Secondary | ICD-10-CM | POA: Diagnosis not present

## 2020-10-10 DIAGNOSIS — I639 Cerebral infarction, unspecified: Secondary | ICD-10-CM | POA: Diagnosis not present

## 2020-10-10 DIAGNOSIS — M6281 Muscle weakness (generalized): Secondary | ICD-10-CM | POA: Diagnosis not present

## 2020-10-11 DIAGNOSIS — F4321 Adjustment disorder with depressed mood: Secondary | ICD-10-CM | POA: Diagnosis not present

## 2020-10-15 DIAGNOSIS — M6281 Muscle weakness (generalized): Secondary | ICD-10-CM | POA: Diagnosis not present

## 2020-10-15 DIAGNOSIS — R633 Feeding difficulties, unspecified: Secondary | ICD-10-CM | POA: Diagnosis not present

## 2020-10-15 DIAGNOSIS — N186 End stage renal disease: Secondary | ICD-10-CM | POA: Diagnosis not present

## 2020-10-15 DIAGNOSIS — I639 Cerebral infarction, unspecified: Secondary | ICD-10-CM | POA: Diagnosis not present

## 2020-10-15 DIAGNOSIS — E1151 Type 2 diabetes mellitus with diabetic peripheral angiopathy without gangrene: Secondary | ICD-10-CM | POA: Diagnosis not present

## 2020-10-15 DIAGNOSIS — R2681 Unsteadiness on feet: Secondary | ICD-10-CM | POA: Diagnosis not present

## 2020-10-16 DIAGNOSIS — N186 End stage renal disease: Secondary | ICD-10-CM | POA: Diagnosis not present

## 2020-10-16 DIAGNOSIS — R2681 Unsteadiness on feet: Secondary | ICD-10-CM | POA: Diagnosis not present

## 2020-10-16 DIAGNOSIS — R633 Feeding difficulties, unspecified: Secondary | ICD-10-CM | POA: Diagnosis not present

## 2020-10-16 DIAGNOSIS — E1151 Type 2 diabetes mellitus with diabetic peripheral angiopathy without gangrene: Secondary | ICD-10-CM | POA: Diagnosis not present

## 2020-10-16 DIAGNOSIS — I639 Cerebral infarction, unspecified: Secondary | ICD-10-CM | POA: Diagnosis not present

## 2020-10-16 DIAGNOSIS — M6281 Muscle weakness (generalized): Secondary | ICD-10-CM | POA: Diagnosis not present

## 2020-10-18 DIAGNOSIS — I639 Cerebral infarction, unspecified: Secondary | ICD-10-CM | POA: Diagnosis not present

## 2020-10-18 DIAGNOSIS — R633 Feeding difficulties, unspecified: Secondary | ICD-10-CM | POA: Diagnosis not present

## 2020-10-18 DIAGNOSIS — R2681 Unsteadiness on feet: Secondary | ICD-10-CM | POA: Diagnosis not present

## 2020-10-18 DIAGNOSIS — E1151 Type 2 diabetes mellitus with diabetic peripheral angiopathy without gangrene: Secondary | ICD-10-CM | POA: Diagnosis not present

## 2020-10-18 DIAGNOSIS — M6281 Muscle weakness (generalized): Secondary | ICD-10-CM | POA: Diagnosis not present

## 2020-10-18 DIAGNOSIS — N186 End stage renal disease: Secondary | ICD-10-CM | POA: Diagnosis not present

## 2020-10-19 DIAGNOSIS — I639 Cerebral infarction, unspecified: Secondary | ICD-10-CM | POA: Diagnosis not present

## 2020-10-19 DIAGNOSIS — N186 End stage renal disease: Secondary | ICD-10-CM | POA: Diagnosis not present

## 2020-10-19 DIAGNOSIS — R2681 Unsteadiness on feet: Secondary | ICD-10-CM | POA: Diagnosis not present

## 2020-10-19 DIAGNOSIS — R633 Feeding difficulties, unspecified: Secondary | ICD-10-CM | POA: Diagnosis not present

## 2020-10-19 DIAGNOSIS — M6281 Muscle weakness (generalized): Secondary | ICD-10-CM | POA: Diagnosis not present

## 2020-10-19 DIAGNOSIS — E1151 Type 2 diabetes mellitus with diabetic peripheral angiopathy without gangrene: Secondary | ICD-10-CM | POA: Diagnosis not present

## 2020-10-20 DIAGNOSIS — N186 End stage renal disease: Secondary | ICD-10-CM | POA: Diagnosis not present

## 2020-10-20 DIAGNOSIS — I639 Cerebral infarction, unspecified: Secondary | ICD-10-CM | POA: Diagnosis not present

## 2020-10-20 DIAGNOSIS — R633 Feeding difficulties, unspecified: Secondary | ICD-10-CM | POA: Diagnosis not present

## 2020-10-20 DIAGNOSIS — M6281 Muscle weakness (generalized): Secondary | ICD-10-CM | POA: Diagnosis not present

## 2020-10-20 DIAGNOSIS — R2681 Unsteadiness on feet: Secondary | ICD-10-CM | POA: Diagnosis not present

## 2020-10-20 DIAGNOSIS — E1151 Type 2 diabetes mellitus with diabetic peripheral angiopathy without gangrene: Secondary | ICD-10-CM | POA: Diagnosis not present

## 2020-10-21 DIAGNOSIS — R633 Feeding difficulties, unspecified: Secondary | ICD-10-CM | POA: Diagnosis not present

## 2020-10-21 DIAGNOSIS — N186 End stage renal disease: Secondary | ICD-10-CM | POA: Diagnosis not present

## 2020-10-21 DIAGNOSIS — I639 Cerebral infarction, unspecified: Secondary | ICD-10-CM | POA: Diagnosis not present

## 2020-10-21 DIAGNOSIS — R2681 Unsteadiness on feet: Secondary | ICD-10-CM | POA: Diagnosis not present

## 2020-10-21 DIAGNOSIS — M6281 Muscle weakness (generalized): Secondary | ICD-10-CM | POA: Diagnosis not present

## 2020-10-21 DIAGNOSIS — E1151 Type 2 diabetes mellitus with diabetic peripheral angiopathy without gangrene: Secondary | ICD-10-CM | POA: Diagnosis not present

## 2020-10-22 DIAGNOSIS — E1151 Type 2 diabetes mellitus with diabetic peripheral angiopathy without gangrene: Secondary | ICD-10-CM | POA: Diagnosis not present

## 2020-10-22 DIAGNOSIS — R633 Feeding difficulties, unspecified: Secondary | ICD-10-CM | POA: Diagnosis not present

## 2020-10-22 DIAGNOSIS — R2681 Unsteadiness on feet: Secondary | ICD-10-CM | POA: Diagnosis not present

## 2020-10-22 DIAGNOSIS — I639 Cerebral infarction, unspecified: Secondary | ICD-10-CM | POA: Diagnosis not present

## 2020-10-22 DIAGNOSIS — M6281 Muscle weakness (generalized): Secondary | ICD-10-CM | POA: Diagnosis not present

## 2020-10-22 DIAGNOSIS — N186 End stage renal disease: Secondary | ICD-10-CM | POA: Diagnosis not present

## 2020-10-24 DIAGNOSIS — N186 End stage renal disease: Secondary | ICD-10-CM | POA: Diagnosis not present

## 2020-10-24 DIAGNOSIS — E1151 Type 2 diabetes mellitus with diabetic peripheral angiopathy without gangrene: Secondary | ICD-10-CM | POA: Diagnosis not present

## 2020-10-24 DIAGNOSIS — M6281 Muscle weakness (generalized): Secondary | ICD-10-CM | POA: Diagnosis not present

## 2020-10-24 DIAGNOSIS — R2681 Unsteadiness on feet: Secondary | ICD-10-CM | POA: Diagnosis not present

## 2020-10-24 DIAGNOSIS — R633 Feeding difficulties, unspecified: Secondary | ICD-10-CM | POA: Diagnosis not present

## 2020-10-24 DIAGNOSIS — I639 Cerebral infarction, unspecified: Secondary | ICD-10-CM | POA: Diagnosis not present

## 2020-10-29 DIAGNOSIS — I639 Cerebral infarction, unspecified: Secondary | ICD-10-CM | POA: Diagnosis not present

## 2020-10-29 DIAGNOSIS — M6281 Muscle weakness (generalized): Secondary | ICD-10-CM | POA: Diagnosis not present

## 2020-10-29 DIAGNOSIS — R2681 Unsteadiness on feet: Secondary | ICD-10-CM | POA: Diagnosis not present

## 2020-10-29 DIAGNOSIS — E1151 Type 2 diabetes mellitus with diabetic peripheral angiopathy without gangrene: Secondary | ICD-10-CM | POA: Diagnosis not present

## 2020-10-29 DIAGNOSIS — N186 End stage renal disease: Secondary | ICD-10-CM | POA: Diagnosis not present

## 2020-10-29 DIAGNOSIS — R633 Feeding difficulties, unspecified: Secondary | ICD-10-CM | POA: Diagnosis not present

## 2020-10-30 DIAGNOSIS — R633 Feeding difficulties, unspecified: Secondary | ICD-10-CM | POA: Diagnosis not present

## 2020-10-30 DIAGNOSIS — N186 End stage renal disease: Secondary | ICD-10-CM | POA: Diagnosis not present

## 2020-10-30 DIAGNOSIS — M6281 Muscle weakness (generalized): Secondary | ICD-10-CM | POA: Diagnosis not present

## 2020-10-30 DIAGNOSIS — E1151 Type 2 diabetes mellitus with diabetic peripheral angiopathy without gangrene: Secondary | ICD-10-CM | POA: Diagnosis not present

## 2020-10-30 DIAGNOSIS — I639 Cerebral infarction, unspecified: Secondary | ICD-10-CM | POA: Diagnosis not present

## 2020-10-30 DIAGNOSIS — R2681 Unsteadiness on feet: Secondary | ICD-10-CM | POA: Diagnosis not present

## 2020-10-31 DIAGNOSIS — E1151 Type 2 diabetes mellitus with diabetic peripheral angiopathy without gangrene: Secondary | ICD-10-CM | POA: Diagnosis not present

## 2020-10-31 DIAGNOSIS — M6281 Muscle weakness (generalized): Secondary | ICD-10-CM | POA: Diagnosis not present

## 2020-10-31 DIAGNOSIS — R2681 Unsteadiness on feet: Secondary | ICD-10-CM | POA: Diagnosis not present

## 2020-10-31 DIAGNOSIS — R633 Feeding difficulties, unspecified: Secondary | ICD-10-CM | POA: Diagnosis not present

## 2020-10-31 DIAGNOSIS — N186 End stage renal disease: Secondary | ICD-10-CM | POA: Diagnosis not present

## 2020-10-31 DIAGNOSIS — I639 Cerebral infarction, unspecified: Secondary | ICD-10-CM | POA: Diagnosis not present

## 2020-11-01 DIAGNOSIS — E1151 Type 2 diabetes mellitus with diabetic peripheral angiopathy without gangrene: Secondary | ICD-10-CM | POA: Diagnosis not present

## 2020-11-01 DIAGNOSIS — N186 End stage renal disease: Secondary | ICD-10-CM | POA: Diagnosis not present

## 2020-11-01 DIAGNOSIS — I639 Cerebral infarction, unspecified: Secondary | ICD-10-CM | POA: Diagnosis not present

## 2020-11-01 DIAGNOSIS — M6281 Muscle weakness (generalized): Secondary | ICD-10-CM | POA: Diagnosis not present

## 2020-11-01 DIAGNOSIS — R633 Feeding difficulties, unspecified: Secondary | ICD-10-CM | POA: Diagnosis not present

## 2020-11-01 DIAGNOSIS — R2681 Unsteadiness on feet: Secondary | ICD-10-CM | POA: Diagnosis not present

## 2020-11-02 DIAGNOSIS — I639 Cerebral infarction, unspecified: Secondary | ICD-10-CM | POA: Diagnosis not present

## 2020-11-02 DIAGNOSIS — R202 Paresthesia of skin: Secondary | ICD-10-CM | POA: Diagnosis not present

## 2020-11-02 DIAGNOSIS — Z992 Dependence on renal dialysis: Secondary | ICD-10-CM | POA: Diagnosis not present

## 2020-11-02 DIAGNOSIS — I509 Heart failure, unspecified: Secondary | ICD-10-CM | POA: Diagnosis not present

## 2020-11-02 DIAGNOSIS — R633 Feeding difficulties, unspecified: Secondary | ICD-10-CM | POA: Diagnosis not present

## 2020-11-02 DIAGNOSIS — N186 End stage renal disease: Secondary | ICD-10-CM | POA: Diagnosis not present

## 2020-11-02 DIAGNOSIS — M6281 Muscle weakness (generalized): Secondary | ICD-10-CM | POA: Diagnosis not present

## 2020-11-02 DIAGNOSIS — R200 Anesthesia of skin: Secondary | ICD-10-CM | POA: Diagnosis not present

## 2020-11-02 DIAGNOSIS — E1151 Type 2 diabetes mellitus with diabetic peripheral angiopathy without gangrene: Secondary | ICD-10-CM | POA: Diagnosis not present

## 2020-11-02 DIAGNOSIS — G629 Polyneuropathy, unspecified: Secondary | ICD-10-CM | POA: Diagnosis not present

## 2020-11-02 DIAGNOSIS — R2681 Unsteadiness on feet: Secondary | ICD-10-CM | POA: Diagnosis not present

## 2020-11-02 DIAGNOSIS — I739 Peripheral vascular disease, unspecified: Secondary | ICD-10-CM | POA: Diagnosis not present

## 2020-11-05 DIAGNOSIS — F329 Major depressive disorder, single episode, unspecified: Secondary | ICD-10-CM | POA: Diagnosis not present

## 2020-11-05 DIAGNOSIS — I509 Heart failure, unspecified: Secondary | ICD-10-CM | POA: Diagnosis not present

## 2020-11-05 DIAGNOSIS — L0291 Cutaneous abscess, unspecified: Secondary | ICD-10-CM | POA: Diagnosis not present

## 2020-11-05 DIAGNOSIS — E1151 Type 2 diabetes mellitus with diabetic peripheral angiopathy without gangrene: Secondary | ICD-10-CM | POA: Diagnosis not present

## 2020-11-05 DIAGNOSIS — J449 Chronic obstructive pulmonary disease, unspecified: Secondary | ICD-10-CM | POA: Diagnosis not present

## 2020-11-05 DIAGNOSIS — D649 Anemia, unspecified: Secondary | ICD-10-CM | POA: Diagnosis not present

## 2020-11-05 DIAGNOSIS — I739 Peripheral vascular disease, unspecified: Secondary | ICD-10-CM | POA: Diagnosis not present

## 2020-11-05 DIAGNOSIS — E785 Hyperlipidemia, unspecified: Secondary | ICD-10-CM | POA: Diagnosis not present

## 2020-11-06 DIAGNOSIS — R633 Feeding difficulties, unspecified: Secondary | ICD-10-CM | POA: Diagnosis not present

## 2020-11-06 DIAGNOSIS — R2681 Unsteadiness on feet: Secondary | ICD-10-CM | POA: Diagnosis not present

## 2020-11-06 DIAGNOSIS — M6281 Muscle weakness (generalized): Secondary | ICD-10-CM | POA: Diagnosis not present

## 2020-11-06 DIAGNOSIS — I639 Cerebral infarction, unspecified: Secondary | ICD-10-CM | POA: Diagnosis not present

## 2020-11-06 DIAGNOSIS — F4321 Adjustment disorder with depressed mood: Secondary | ICD-10-CM | POA: Diagnosis not present

## 2020-11-06 DIAGNOSIS — E1151 Type 2 diabetes mellitus with diabetic peripheral angiopathy without gangrene: Secondary | ICD-10-CM | POA: Diagnosis not present

## 2020-11-06 DIAGNOSIS — N186 End stage renal disease: Secondary | ICD-10-CM | POA: Diagnosis not present

## 2020-11-07 DIAGNOSIS — R633 Feeding difficulties, unspecified: Secondary | ICD-10-CM | POA: Diagnosis not present

## 2020-11-07 DIAGNOSIS — M6281 Muscle weakness (generalized): Secondary | ICD-10-CM | POA: Diagnosis not present

## 2020-11-07 DIAGNOSIS — R2681 Unsteadiness on feet: Secondary | ICD-10-CM | POA: Diagnosis not present

## 2020-11-07 DIAGNOSIS — I639 Cerebral infarction, unspecified: Secondary | ICD-10-CM | POA: Diagnosis not present

## 2020-11-07 DIAGNOSIS — E1151 Type 2 diabetes mellitus with diabetic peripheral angiopathy without gangrene: Secondary | ICD-10-CM | POA: Diagnosis not present

## 2020-11-07 DIAGNOSIS — N186 End stage renal disease: Secondary | ICD-10-CM | POA: Diagnosis not present

## 2020-11-08 DIAGNOSIS — R2681 Unsteadiness on feet: Secondary | ICD-10-CM | POA: Diagnosis not present

## 2020-11-08 DIAGNOSIS — R633 Feeding difficulties, unspecified: Secondary | ICD-10-CM | POA: Diagnosis not present

## 2020-11-08 DIAGNOSIS — I639 Cerebral infarction, unspecified: Secondary | ICD-10-CM | POA: Diagnosis not present

## 2020-11-08 DIAGNOSIS — M6281 Muscle weakness (generalized): Secondary | ICD-10-CM | POA: Diagnosis not present

## 2020-11-08 DIAGNOSIS — E1151 Type 2 diabetes mellitus with diabetic peripheral angiopathy without gangrene: Secondary | ICD-10-CM | POA: Diagnosis not present

## 2020-11-08 DIAGNOSIS — N186 End stage renal disease: Secondary | ICD-10-CM | POA: Diagnosis not present

## 2020-11-09 DIAGNOSIS — R633 Feeding difficulties, unspecified: Secondary | ICD-10-CM | POA: Diagnosis not present

## 2020-11-09 DIAGNOSIS — M6281 Muscle weakness (generalized): Secondary | ICD-10-CM | POA: Diagnosis not present

## 2020-11-09 DIAGNOSIS — E1151 Type 2 diabetes mellitus with diabetic peripheral angiopathy without gangrene: Secondary | ICD-10-CM | POA: Diagnosis not present

## 2020-11-09 DIAGNOSIS — N186 End stage renal disease: Secondary | ICD-10-CM | POA: Diagnosis not present

## 2020-11-09 DIAGNOSIS — R2681 Unsteadiness on feet: Secondary | ICD-10-CM | POA: Diagnosis not present

## 2020-11-09 DIAGNOSIS — I639 Cerebral infarction, unspecified: Secondary | ICD-10-CM | POA: Diagnosis not present

## 2020-11-12 DIAGNOSIS — N186 End stage renal disease: Secondary | ICD-10-CM | POA: Diagnosis not present

## 2020-11-12 DIAGNOSIS — M6281 Muscle weakness (generalized): Secondary | ICD-10-CM | POA: Diagnosis not present

## 2020-11-12 DIAGNOSIS — R2681 Unsteadiness on feet: Secondary | ICD-10-CM | POA: Diagnosis not present

## 2020-11-12 DIAGNOSIS — R633 Feeding difficulties, unspecified: Secondary | ICD-10-CM | POA: Diagnosis not present

## 2020-11-12 DIAGNOSIS — E1151 Type 2 diabetes mellitus with diabetic peripheral angiopathy without gangrene: Secondary | ICD-10-CM | POA: Diagnosis not present

## 2020-11-12 DIAGNOSIS — I639 Cerebral infarction, unspecified: Secondary | ICD-10-CM | POA: Diagnosis not present

## 2020-11-13 DIAGNOSIS — E1151 Type 2 diabetes mellitus with diabetic peripheral angiopathy without gangrene: Secondary | ICD-10-CM | POA: Diagnosis not present

## 2020-11-13 DIAGNOSIS — M6281 Muscle weakness (generalized): Secondary | ICD-10-CM | POA: Diagnosis not present

## 2020-11-13 DIAGNOSIS — N186 End stage renal disease: Secondary | ICD-10-CM | POA: Diagnosis not present

## 2020-11-13 DIAGNOSIS — F4320 Adjustment disorder, unspecified: Secondary | ICD-10-CM | POA: Diagnosis not present

## 2020-11-13 DIAGNOSIS — R2681 Unsteadiness on feet: Secondary | ICD-10-CM | POA: Diagnosis not present

## 2020-11-13 DIAGNOSIS — R633 Feeding difficulties, unspecified: Secondary | ICD-10-CM | POA: Diagnosis not present

## 2020-11-13 DIAGNOSIS — I639 Cerebral infarction, unspecified: Secondary | ICD-10-CM | POA: Diagnosis not present

## 2020-11-14 DIAGNOSIS — I639 Cerebral infarction, unspecified: Secondary | ICD-10-CM | POA: Diagnosis not present

## 2020-11-14 DIAGNOSIS — R2681 Unsteadiness on feet: Secondary | ICD-10-CM | POA: Diagnosis not present

## 2020-11-14 DIAGNOSIS — M6281 Muscle weakness (generalized): Secondary | ICD-10-CM | POA: Diagnosis not present

## 2020-11-14 DIAGNOSIS — E1151 Type 2 diabetes mellitus with diabetic peripheral angiopathy without gangrene: Secondary | ICD-10-CM | POA: Diagnosis not present

## 2020-11-14 DIAGNOSIS — N186 End stage renal disease: Secondary | ICD-10-CM | POA: Diagnosis not present

## 2020-11-14 DIAGNOSIS — R633 Feeding difficulties, unspecified: Secondary | ICD-10-CM | POA: Diagnosis not present

## 2020-11-15 DIAGNOSIS — N186 End stage renal disease: Secondary | ICD-10-CM | POA: Diagnosis not present

## 2020-11-15 DIAGNOSIS — R2681 Unsteadiness on feet: Secondary | ICD-10-CM | POA: Diagnosis not present

## 2020-11-15 DIAGNOSIS — I639 Cerebral infarction, unspecified: Secondary | ICD-10-CM | POA: Diagnosis not present

## 2020-11-15 DIAGNOSIS — E1151 Type 2 diabetes mellitus with diabetic peripheral angiopathy without gangrene: Secondary | ICD-10-CM | POA: Diagnosis not present

## 2020-11-15 DIAGNOSIS — M6281 Muscle weakness (generalized): Secondary | ICD-10-CM | POA: Diagnosis not present

## 2020-11-15 DIAGNOSIS — R633 Feeding difficulties, unspecified: Secondary | ICD-10-CM | POA: Diagnosis not present

## 2020-11-16 DIAGNOSIS — R2681 Unsteadiness on feet: Secondary | ICD-10-CM | POA: Diagnosis not present

## 2020-11-16 DIAGNOSIS — R633 Feeding difficulties, unspecified: Secondary | ICD-10-CM | POA: Diagnosis not present

## 2020-11-16 DIAGNOSIS — N186 End stage renal disease: Secondary | ICD-10-CM | POA: Diagnosis not present

## 2020-11-16 DIAGNOSIS — E1151 Type 2 diabetes mellitus with diabetic peripheral angiopathy without gangrene: Secondary | ICD-10-CM | POA: Diagnosis not present

## 2020-11-16 DIAGNOSIS — I639 Cerebral infarction, unspecified: Secondary | ICD-10-CM | POA: Diagnosis not present

## 2020-11-16 DIAGNOSIS — M6281 Muscle weakness (generalized): Secondary | ICD-10-CM | POA: Diagnosis not present

## 2020-11-19 DIAGNOSIS — R2681 Unsteadiness on feet: Secondary | ICD-10-CM | POA: Diagnosis not present

## 2020-11-19 DIAGNOSIS — E785 Hyperlipidemia, unspecified: Secondary | ICD-10-CM | POA: Diagnosis not present

## 2020-11-19 DIAGNOSIS — I739 Peripheral vascular disease, unspecified: Secondary | ICD-10-CM | POA: Diagnosis not present

## 2020-11-19 DIAGNOSIS — F329 Major depressive disorder, single episode, unspecified: Secondary | ICD-10-CM | POA: Diagnosis not present

## 2020-11-19 DIAGNOSIS — N186 End stage renal disease: Secondary | ICD-10-CM | POA: Diagnosis not present

## 2020-11-19 DIAGNOSIS — R633 Feeding difficulties, unspecified: Secondary | ICD-10-CM | POA: Diagnosis not present

## 2020-11-19 DIAGNOSIS — I639 Cerebral infarction, unspecified: Secondary | ICD-10-CM | POA: Diagnosis not present

## 2020-11-19 DIAGNOSIS — I10 Essential (primary) hypertension: Secondary | ICD-10-CM | POA: Diagnosis not present

## 2020-11-19 DIAGNOSIS — E1151 Type 2 diabetes mellitus with diabetic peripheral angiopathy without gangrene: Secondary | ICD-10-CM | POA: Diagnosis not present

## 2020-11-19 DIAGNOSIS — I2510 Atherosclerotic heart disease of native coronary artery without angina pectoris: Secondary | ICD-10-CM | POA: Diagnosis not present

## 2020-11-19 DIAGNOSIS — G629 Polyneuropathy, unspecified: Secondary | ICD-10-CM | POA: Diagnosis not present

## 2020-11-19 DIAGNOSIS — M6281 Muscle weakness (generalized): Secondary | ICD-10-CM | POA: Diagnosis not present

## 2020-11-19 DIAGNOSIS — I679 Cerebrovascular disease, unspecified: Secondary | ICD-10-CM | POA: Diagnosis not present

## 2020-11-19 DIAGNOSIS — L0291 Cutaneous abscess, unspecified: Secondary | ICD-10-CM | POA: Diagnosis not present

## 2020-11-21 DIAGNOSIS — I639 Cerebral infarction, unspecified: Secondary | ICD-10-CM | POA: Diagnosis not present

## 2020-11-21 DIAGNOSIS — M6281 Muscle weakness (generalized): Secondary | ICD-10-CM | POA: Diagnosis not present

## 2020-11-21 DIAGNOSIS — E1151 Type 2 diabetes mellitus with diabetic peripheral angiopathy without gangrene: Secondary | ICD-10-CM | POA: Diagnosis not present

## 2020-11-21 DIAGNOSIS — R633 Feeding difficulties, unspecified: Secondary | ICD-10-CM | POA: Diagnosis not present

## 2020-11-21 DIAGNOSIS — N186 End stage renal disease: Secondary | ICD-10-CM | POA: Diagnosis not present

## 2020-11-21 DIAGNOSIS — R2681 Unsteadiness on feet: Secondary | ICD-10-CM | POA: Diagnosis not present

## 2020-11-22 DIAGNOSIS — R2681 Unsteadiness on feet: Secondary | ICD-10-CM | POA: Diagnosis not present

## 2020-11-22 DIAGNOSIS — I639 Cerebral infarction, unspecified: Secondary | ICD-10-CM | POA: Diagnosis not present

## 2020-11-22 DIAGNOSIS — R633 Feeding difficulties, unspecified: Secondary | ICD-10-CM | POA: Diagnosis not present

## 2020-11-22 DIAGNOSIS — N186 End stage renal disease: Secondary | ICD-10-CM | POA: Diagnosis not present

## 2020-11-22 DIAGNOSIS — E1151 Type 2 diabetes mellitus with diabetic peripheral angiopathy without gangrene: Secondary | ICD-10-CM | POA: Diagnosis not present

## 2020-11-22 DIAGNOSIS — M6281 Muscle weakness (generalized): Secondary | ICD-10-CM | POA: Diagnosis not present

## 2020-11-23 DIAGNOSIS — N186 End stage renal disease: Secondary | ICD-10-CM | POA: Diagnosis not present

## 2020-11-23 DIAGNOSIS — M6281 Muscle weakness (generalized): Secondary | ICD-10-CM | POA: Diagnosis not present

## 2020-11-23 DIAGNOSIS — R633 Feeding difficulties, unspecified: Secondary | ICD-10-CM | POA: Diagnosis not present

## 2020-11-23 DIAGNOSIS — R2681 Unsteadiness on feet: Secondary | ICD-10-CM | POA: Diagnosis not present

## 2020-11-23 DIAGNOSIS — I639 Cerebral infarction, unspecified: Secondary | ICD-10-CM | POA: Diagnosis not present

## 2020-11-23 DIAGNOSIS — E1151 Type 2 diabetes mellitus with diabetic peripheral angiopathy without gangrene: Secondary | ICD-10-CM | POA: Diagnosis not present

## 2020-11-26 DIAGNOSIS — E1151 Type 2 diabetes mellitus with diabetic peripheral angiopathy without gangrene: Secondary | ICD-10-CM | POA: Diagnosis not present

## 2020-11-26 DIAGNOSIS — M6281 Muscle weakness (generalized): Secondary | ICD-10-CM | POA: Diagnosis not present

## 2020-11-26 DIAGNOSIS — N186 End stage renal disease: Secondary | ICD-10-CM | POA: Diagnosis not present

## 2020-11-26 DIAGNOSIS — R633 Feeding difficulties, unspecified: Secondary | ICD-10-CM | POA: Diagnosis not present

## 2020-11-26 DIAGNOSIS — R2681 Unsteadiness on feet: Secondary | ICD-10-CM | POA: Diagnosis not present

## 2020-11-26 DIAGNOSIS — L089 Local infection of the skin and subcutaneous tissue, unspecified: Secondary | ICD-10-CM | POA: Diagnosis not present

## 2020-11-26 DIAGNOSIS — I639 Cerebral infarction, unspecified: Secondary | ICD-10-CM | POA: Diagnosis not present

## 2020-12-31 ENCOUNTER — Other Ambulatory Visit: Payer: Self-pay

## 2020-12-31 ENCOUNTER — Emergency Department (HOSPITAL_COMMUNITY)
Admission: EM | Admit: 2020-12-31 | Discharge: 2020-12-31 | Disposition: A | Discharge status: Home / Self Care | Payer: Medicare Other | Attending: Emergency Medicine | Admitting: Emergency Medicine

## 2020-12-31 DIAGNOSIS — I132 Hypertensive heart and chronic kidney disease with heart failure and with stage 5 chronic kidney disease, or end stage renal disease: Secondary | ICD-10-CM | POA: Insufficient documentation

## 2020-12-31 DIAGNOSIS — F17210 Nicotine dependence, cigarettes, uncomplicated: Secondary | ICD-10-CM | POA: Diagnosis not present

## 2020-12-31 DIAGNOSIS — Z992 Dependence on renal dialysis: Secondary | ICD-10-CM | POA: Insufficient documentation

## 2020-12-31 DIAGNOSIS — N186 End stage renal disease: Secondary | ICD-10-CM | POA: Diagnosis not present

## 2020-12-31 DIAGNOSIS — I5043 Acute on chronic combined systolic (congestive) and diastolic (congestive) heart failure: Secondary | ICD-10-CM | POA: Diagnosis not present

## 2020-12-31 DIAGNOSIS — Z0000 Encounter for general adult medical examination without abnormal findings: Secondary | ICD-10-CM | POA: Diagnosis not present

## 2020-12-31 DIAGNOSIS — Z79899 Other long term (current) drug therapy: Secondary | ICD-10-CM | POA: Insufficient documentation

## 2020-12-31 DIAGNOSIS — Z7982 Long term (current) use of aspirin: Secondary | ICD-10-CM | POA: Diagnosis not present

## 2020-12-31 DIAGNOSIS — I2510 Atherosclerotic heart disease of native coronary artery without angina pectoris: Secondary | ICD-10-CM | POA: Diagnosis not present

## 2020-12-31 DIAGNOSIS — E1140 Type 2 diabetes mellitus with diabetic neuropathy, unspecified: Secondary | ICD-10-CM | POA: Insufficient documentation

## 2020-12-31 DIAGNOSIS — E1122 Type 2 diabetes mellitus with diabetic chronic kidney disease: Secondary | ICD-10-CM | POA: Insufficient documentation

## 2020-12-31 DIAGNOSIS — Z4800 Encounter for change or removal of nonsurgical wound dressing: Secondary | ICD-10-CM | POA: Diagnosis present

## 2020-12-31 LAB — CBG MONITORING, ED: Glucose-Capillary: 78 mg/dL (ref 70–99)

## 2020-12-31 LAB — COMPREHENSIVE METABOLIC PANEL
ALT: 10 U/L (ref 0–44)
AST: 17 U/L (ref 15–41)
Albumin: 2.7 g/dL — ABNORMAL LOW (ref 3.5–5.0)
Alkaline Phosphatase: 111 U/L (ref 38–126)
Anion gap: 11 (ref 5–15)
BUN: 12 mg/dL (ref 8–23)
CO2: 33 mmol/L — ABNORMAL HIGH (ref 22–32)
Calcium: 8.3 mg/dL — ABNORMAL LOW (ref 8.9–10.3)
Chloride: 95 mmol/L — ABNORMAL LOW (ref 98–111)
Creatinine, Ser: 2.92 mg/dL — ABNORMAL HIGH (ref 0.61–1.24)
GFR, Estimated: 22 mL/min — ABNORMAL LOW (ref >60)
Glucose, Bld: 87 mg/dL (ref 70–99)
Potassium: 3.4 mmol/L — ABNORMAL LOW (ref 3.5–5.1)
Sodium: 139 mmol/L (ref 135–145)
Total Bilirubin: 0.4 mg/dL (ref 0.3–1.2)
Total Protein: 6.8 g/dL (ref 6.5–8.1)

## 2020-12-31 LAB — CBC
HCT: 30.8 % — ABNORMAL LOW (ref 39.0–52.0)
Hemoglobin: 10.2 g/dL — ABNORMAL LOW (ref 13.0–17.0)
MCH: 30.1 pg (ref 26.0–34.0)
MCHC: 33.1 g/dL (ref 30.0–36.0)
MCV: 90.9 fL (ref 80.0–100.0)
Platelets: 125 10*3/uL — ABNORMAL LOW (ref 150–400)
RBC: 3.39 MIL/uL — ABNORMAL LOW (ref 4.22–5.81)
RDW: 14.6 % (ref 11.5–15.5)
WBC: 5 10*3/uL (ref 4.0–10.5)
nRBC: 0 % (ref 0.0–0.2)

## 2020-12-31 NOTE — ED Triage Notes (Addendum)
Pt from dialysis center, resident of [facility]. Has wound to groin, which staff at dialysis has been dressing. Last dressing change on 1/26. Completed dialysis tx today. Staff also reports pt is altered, is normally docile but today is more agitated. Pt's only complaint is a callous on his RLE.

## 2020-12-31 NOTE — ED Provider Notes (Signed)
[facility] EMERGENCY DEPARTMENT Provider Note   CSN: [id number] Arrival date & time: 12/31/20  1633     History No chief complaint on file.   Trevor [name] is a 75 y.o. male who presents from dialysis center after completion of his dialysis session for evaluation of his right femoral wound prior to being transported back to his facility.  He states that he has not been having any pain, oozing, bleeding from the wound, he denies any fevers or chills at home.  He states that he "has no idea why I am here."  He denies any chest pain, shortness of breath, palpitations, abdominal pain, nausea, vomiting, diarrhea, dysuria, frequency, headaches, confusion, blurry vision or double vision.  He states that when the ambulance picked him up from his dialysis center he asked them to take him back to his facility rather than go to the emergency department, and they told him no.  Patient lives at [facility], last wound dressing change on 1/26.  I personally reviewed this patient medical records.  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  HPI     Past Medical History:  Diagnosis Date  . AAA (abdominal aortic aneurysm) ([place])   . Anemia   . Anxiety   . Arthritis    arms and back  . Blindness   . CHF (congestive heart failure) ([place])   . Coronary artery disease    mild   . 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 Diabetes mellitus without complication ([place])   . ESRD on hemodialysis ([place])    started HD 2014-15  . Headache(784.0)   . HTN (hypertension)   . Protein-calorie malnutrition ([place])   . STEMI (ST elevation myocardial infarction) ([place]) 11/11/2018  . Stroke [facility]) 01/2018   no residual deficits    Patient Active Problem List   Diagnosis Date Noted  . Dyspnea 05/10/2019  . Anemia associated with chronic renal failure   .  Abnormal CXR 02/28/2019  . Marijuana abuse 02/28/2019  . Congestive heart failure (CHF) ([place]) 02/28/2019  . Cellulitis 02/28/2019  . Sepsis ([place]) 02/28/2019  . History of ST elevation myocardial infarction (STEMI) 11/11/2018  . Pulmonary edema 09/20/2018  . Acute respiratory failure with hypoxia ([place]) 03/29/2018  . Sleep stage dysfunction 11/10/2017  . 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 Vascular device, implant, or graft complication 0000000  . Peripheral arterial disease ([place]) 10/20/2017  . Non-allergic rhinitis 10/20/2017  . 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 BPH (benign prostatic hyperplasia) 07/07/2017  . At risk for adverse drug reaction 07/02/2017  . Acute pulmonary edema ([place]) 06/27/2017  . Hypertensive urgency 01/24/2017  . 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 Ischemic chest pain ([place]) 11/01/2016  . Essential hypertension 03/08/2016  . Dyslipidemia 01/12/2016  . Chest pain 08/12/2015  . Accelerated hypertension 08/12/2015  . Carotid stenosis   . Abdominal aortic aneurysm ([place]) 03/06/2015  . Protein-calorie malnutrition, severe ([place]) 05/20/2014  . End-stage renal disease on hemodialysis ([place]) 05/19/2014  . Anemia of renal disease 05/19/2014  . Coronary artery disease, non-occlusive:  09/28/2012  . Secondary hyperparathyroidism ([place]) 09/27/2012  . Non compliance with medical treatment 09/27/2012  . Acute on chronic combined systolic and diastolic CHF (congestive heart failure) ([place]) 10/10/2011  . A-fib ([place]) 10/08/2011  . History of CVA (cerebrovascular accident) 10/07/2011  . Cocaine abuse ([place]) 10/07/2011  . Tobacco  abuse 08/07/2011  . Bruit 08/07/2011    Past Surgical History:  Procedure Laterality Date  . A/V FISTULAGRAM N/A 07/29/2018   Procedure: A/V FISTULAGRAM - left upper extremity;  Surgeon: Waynetta Sandy, MD;  Location: [facility];  Service: Cardiovascular;  Laterality: N/A;  . AMPUTATION Left 03/01/2019   Procedure:  AMPUTATION ABOVE KNEE;  Surgeon: Angelia Mould, MD;  Location: [facility];  Service: Vascular;  Laterality: Left;  . AXILLARY-FEMORAL BYPASS GRAFT  06/28/2017   Procedure: BYPASS GRAFT RIGHT AXILLA-BIFEMORAL USING 8MM X 30CM AND 8MM X 60CM HEMASHIELD GOLD GRAFTS;  Surgeon: Rosetta Posner, MD;  Location: [facility];  Service: Vascular;;  . CARDIAC CATHETERIZATION N/A 11/01/2016   Procedure: Left Heart Cath and Coronary Angiography;  Surgeon: Lorretta Harp, MD;  Location: [facility];  Service: Cardiovascular;  Laterality: N/A;  . ENDARTERECTOMY FEMORAL Bilateral 06/28/2017   Procedure: ENDARTERECTOMY BILATERAL FEMORAL ARTERIES;  Surgeon: Rosetta Posner, MD;  Location: [facility];  Service: Vascular;  Laterality: Bilateral;  . FISTULA SUPERFICIALIZATION Left [id number]   Procedure: PLICATION OF ARTERIOVENOUS FISTULA LEFT ARM;  Surgeon: Waynetta Sandy, MD;  Location: [facility];  Service: Vascular;  Laterality: Left;  . FISTULOGRAM Left 05/20/2018   Procedure: FISTULOGRAM WITH BALLOON ANGIOPLASTY LEFT ARM ARTERIOVENOUS FISTULA;  Surgeon: Waynetta Sandy, MD;  Location: [facility];  Service: Vascular;  Laterality: Left;  . INSERTION OF DIALYSIS CATHETER  09/21/2012   Procedure: INSERTION OF DIALYSIS CATHETER;  Surgeon: Angelia Mould, MD;  Location: [facility];  Service: Vascular;  Laterality: N/A;  Right Internal Jugular Placement  . LEFT HEART CATH N/A 11/11/2018   Procedure: LEFT HEART CATH;  Surgeon: Sherren Mocha, MD;  Location: [place] [facility];  Service: Cardiovascular;  Laterality: N/A;  . LEFT HEART CATHETERIZATION WITH CORONARY ANGIOGRAM N/A 09/28/2012   Procedure: LEFT HEART CATHETERIZATION WITH CORONARY ANGIOGRAM;  Surgeon: Sherren Mocha, MD;  Location: [facility] CATH LAB;  Service: Cardiovascular;  Laterality: N/A;  . LEFT HEART CATHETERIZATION WITH CORONARY ANGIOGRAM N/A 07/07/2014   Procedure: LEFT HEART CATHETERIZATION WITH CORONARY ANGIOGRAM;  Surgeon: Leonie Man, MD;   Location: [facility] CATH LAB;  Service: Cardiovascular;  Laterality: N/A;  . PERIPHERAL VASCULAR BALLOON ANGIOPLASTY Left 07/29/2018   Procedure: PERIPHERAL VASCULAR BALLOON ANGIOPLASTY;  Surgeon: Waynetta Sandy, MD;  Location: [place] CV LAB;  Service: Cardiovascular;  Laterality: Left;  CENTRAL VEIN  . PERIPHERAL VASCULAR INTERVENTION Left 07/29/2018   Procedure: PERIPHERAL VASCULAR INTERVENTION;  Surgeon: Waynetta Sandy, MD;  Location: [place] CV LAB;  Service: Cardiovascular;  Laterality: Left;  AXILLARY VEIN  . REVISION OF ARTERIOVENOUS GORETEX GRAFT Left 12/30/2018   Procedure: REVISION OF ARTERIOVENOUS FISTULA LEFT ARM;  Surgeon: Rosetta Posner, MD;  Location: [facility];  Service: Vascular;  Laterality: Left;  . REVISON OF ARTERIOVENOUS FISTULA Left 08/17/2018   Procedure: REVISION OF ARTERIOVENOUS FISTULA LEFT ARM;  Surgeon: Waynetta Sandy, MD;  Location: [facility];  Service: Vascular;  Laterality: Left;  . right hand    . TEE WITHOUT CARDIOVERSION  10/09/2011   Procedure: TRANSESOPHAGEAL ECHOCARDIOGRAM (TEE);  Surgeon: Lelon Perla, MD;  Location: [facility] ENDOSCOPY;  Service: Cardiovascular;  Laterality: N/A;       Family History  Problem Relation Age of Onset  . Heart attack Mother        MI in her 34s  . Diabetes Mother   . Alcohol abuse Father   . Anesthesia problems Neg Hx   .  Hypotension Neg Hx   . Malignant hyperthermia Neg Hx   . Pseudochol deficiency Neg Hx     Social History   Tobacco Use  . Smoking status: Current Every Day Smoker    Packs/day: 0.50    Years: 60.00    Pack years: 30.00    Types: Cigarettes  . Smokeless tobacco: Never Used  . Tobacco comment: 5-6 cigarettes per day  Vaping Use  . Vaping Use: Never used  Substance Use Topics  . Alcohol use: No    Alcohol/week: 0.0 standard drinks    Comment: Occasional  . Drug use: No    Frequency: 2.0 times per week    Types: "Crack" cocaine    Home Medications Prior to Admission  medications   Medication Sig Start Date End Date Taking? Authorizing Provider  aspirin EC 81 MG tablet Take 1 tablet (81 mg total) by mouth daily. 11/15/18   Bhagat, Crista Luria, PA  atorvastatin (LIPITOR) 80 MG tablet Take 1 tablet (80 mg total) by mouth at bedtime. 11/15/18   Bhagat, Crista Luria, PA  b complex-vitamin c-folic acid (NEPHRO-VITE) 0.8 MG TABS tablet Take 1 tablet by mouth daily.    [provider]  bisacodyl (DULCOLAX) 10 MG suppository Place 10 mg rectally daily as needed (constipation).     [provider]  clopidogrel (PLAVIX) 75 MG tablet Take 1 tablet (75 mg total) by mouth daily. 04/05/15   Barton Dubois, MD  docusate sodium (COLACE) 100 MG capsule Take 200 mg by mouth at bedtime.     [provider]  gabapentin (NEURONTIN) 300 MG capsule Take 1 capsule (300 mg total) by mouth at bedtime. 01/14/18   Colbert Ewing, MD  Icosapent Ethyl (VASCEPA) 1 g CAPS Take 1 g by mouth 2 (two) times daily.     [provider]  Ipratropium-Albuterol (COMBIVENT IN) Inhale 2 puffs into the lungs every 8 (eight) hours as needed (shortness of breath, wheezing).    [provider]  isosorbide mononitrate (IMDUR) 30 MG 24 hr tablet Take 1 tablet (30 mg total) by mouth daily. 11/22/18 07/09/21  Erlene Quan, PA-C  metoprolol tartrate (LOPRESSOR) 25 MG tablet Take 0.5 tablets (12.5 mg total) by mouth 2 (two) times daily. 11/15/18   Bhagat, Crista Luria, PA  mirtazapine (REMERON) 7.5 MG tablet Take 7.5 mg by mouth at bedtime.    [provider]  nitroGLYCERIN (NITROSTAT) 0.4 MG SL tablet Place 1 tablet (0.4 mg total) under the tongue every 5 (five) minutes x 3 doses as needed for chest pain. 11/15/18   Bhagat, Crista Luria, PA  Nutritional Supplements (FEEDING SUPPLEMENT, NEPRO CARB STEADY,) LIQD Take 237 mLs by mouth 2 (two) times daily between meals. 12/11/16   Geradine Girt, DO  Nutritional Supplements (PROMOD) LIQD Take 30 mLs by mouth 2 (two)  times a day.    [provider]  ondansetron (ZOFRAN) 4 MG tablet Take 4 mg by mouth every 6 (six) hours as needed for nausea or vomiting.    [provider]  polyethylene glycol powder (GLYCOLAX/MIRALAX) 17 GM/SCOOP powder Take 17 g by mouth daily. (in liquid)    [provider]  senna (SENOKOT) 8.6 MG TABS tablet Take 1 tablet by mouth daily.    [provider]  sevelamer carbonate (RENVELA) 800 MG tablet Take 1,600 mg by mouth 3 (three) times daily with meals.  12/13/18   [provider]  tiotropium (SPIRIVA HANDIHALER) 18 MCG inhalation capsule Place 18 mcg into inhaler and inhale daily.  [provider]    Allergies    Patient has no known allergies.  Review of Systems   Review of Systems  Constitutional: Negative.   HENT: Negative.   Eyes: Negative.   Respiratory: Negative.   Cardiovascular: Negative.   Gastrointestinal: Negative.   Endocrine: Negative.   Genitourinary: Negative.   Musculoskeletal: Negative.   Skin: Positive for wound.  Allergic/Immunologic: Negative.   Neurological: Negative.   Hematological: Negative.   Psychiatric/Behavioral: Negative.     Physical Exam Updated Vital Signs BP (!) 149/60 (BP Location: Right Arm)   Pulse 73   Temp 98.9 F (37.2 C) (Oral)   Resp 17   SpO2 100%   Physical Exam Vitals and nursing note reviewed.  Constitutional:      General: He is not in acute distress.    Appearance: He is not ill-appearing.  HENT:     Head: Normocephalic and atraumatic.     Nose: Nose normal.     Mouth/Throat:     Mouth: Mucous membranes are moist.     Palate: No mass.     Pharynx: Oropharynx is clear. Uvula midline. No oropharyngeal exudate or posterior oropharyngeal erythema.     Tonsils: No tonsillar exudate.  Eyes:     General: Lids are normal. Vision grossly intact.        Right eye: No discharge.        Left eye: No discharge.     Extraocular Movements: Extraocular movements  intact.     Conjunctiva/sclera: Conjunctivae normal.     Pupils: Pupils are equal, round, and reactive to light.  Neck:     Trachea: Trachea and phonation normal.  Cardiovascular:     Rate and Rhythm: Normal rate and regular rhythm.     Pulses:          Left popliteal pulse not accessible.       Dorsalis pedis pulses are detected w/ Doppler on the right side. Left dorsalis pedis pulse not accessible.       Posterior tibial pulses are detected w/ Doppler on the right side. Left posterior tibial pulse not accessible.     Heart sounds: Normal heart sounds. No murmur heard.   Pulmonary:     Effort: Pulmonary effort is normal. No respiratory distress.     Breath sounds: Normal breath sounds. No wheezing or rales.  Chest:     Chest wall: No deformity, swelling, tenderness, crepitus or edema. There is no dullness to percussion.  Abdominal:     General: Bowel sounds are normal. There is no distension.     Palpations: Abdomen is soft.     Tenderness: There is no abdominal tenderness. There is no right CVA tenderness, left CVA tenderness, guarding or rebound.    Musculoskeletal:        General: No deformity.     Cervical back: Full passive range of motion without pain, normal range of motion and neck supple. No rigidity, tenderness or crepitus. No pain with movement or spinous process tenderness.     Right lower leg: Normal. No edema.     Left lower leg: No edema.     Right ankle: Normal.     Right foot: Normal range of motion and normal capillary refill. No tenderness or bony tenderness. Abnormal pulse.       Legs:     Comments: Decreased sensation in right foot, patient with history of severe diabetic peripheral neuropathy.     Left Lower Extremity: Left leg is  amputated above knee.  Lymphadenopathy:     Cervical: No cervical adenopathy.  Skin:    General: Skin is warm and dry.     Capillary Refill: Capillary refill takes less than 2 seconds.  Neurological:     General: No focal  deficit present.     Mental Status: He is alert and oriented to person, place, and time. Mental status is at baseline.     Cranial Nerves: Cranial nerves are intact.     Sensory: Sensation is intact.     Motor: Motor function is intact.  Psychiatric:        Mood and Affect: Mood normal.     ED Results / Procedures / Treatments   Labs (all labs ordered are listed, but only abnormal results are displayed) Labs Reviewed  COMPREHENSIVE METABOLIC PANEL - Abnormal; Notable for the following components:      Result Value   Potassium 3.4 (*)    Chloride 95 (*)    CO2 33 (*)    Creatinine, Ser 2.92 (*)    Calcium 8.3 (*)    Albumin 2.7 (*)    GFR, Estimated 22 (*)    All other components within normal limits  CBC - Abnormal; Notable for the following components:   RBC 3.39 (*)    Hemoglobin 10.2 (*)    HCT 30.8 (*)    Platelets 125 (*)    All other components within normal limits  CBG MONITORING, ED    EKG EKG Interpretation  Date/Time:  Monday December 31 2020 16:40:08 EST Ventricular Rate:  74 PR Interval:  154 QRS Duration: 96 QT Interval:  446 QTC Calculation: 495 R Axis:   42 Text Interpretation: Normal sinus rhythm Left ventricular hypertrophy with repolarization abnormality ( Cornell product ) Prolonged QT Abnormal ECG Confirmed by Dene Gentry [phone]) on 12/31/2020 7:17:15 PM   Radiology No results found.  Procedures Procedures  Medications Ordered in ED Medications - No data to display  ED Course  I have reviewed the triage vital signs and the nursing notes.  Pertinent labs & imaging results that were available during my care of the patient were reviewed by me and considered in my medical decision making (see chart for details).    MDM Rules/Calculators/A&P                         Pleasant 75 year old male who presents from dialysis for evaluation of wound.  Patient hypertensive on intake to 151/20.  Vital signs otherwise normal.  Cardiopulmonary exam  is normal, abdominal exam is benign.  Patient with  well-healed scars in the femoral area bilaterally, without open wound, induration, crepitus, erythema, or drainage.  I see no indication for continued wound management over this area, does not appear to be any wound.  Patient is neurovascularly intact in his remaining lower extremity.   Basic laboratory studies obtained in triage.  CBC with mild anemia with hemoglobin of 10.2, at patient baseline.  CMP with mild hypokalemia to 3.4, and elevated creatinine, at patient's baseline.  EKG reassuring, normal sinus rhythm with borderline prolonged QT.  Given reassuring physical exam, vital signs, and laboratory studies, no further work-up is warranted in the ED at this time. There does not appear to to be any emergent issue with this patient's health.   Jerami voiced understanding of his medical evaluation.  Each of his questions was answered to his expressed satisfaction.  He may return to the emergency department anytime  he needs emergent medical evaluation.  Patient is well-appearing, stable, and appropriate for discharge at this time.  This chart was dictated using voice recognition software, Dragon. Despite the best efforts of this provider to proofread and correct errors, errors may still occur which can change documentation meaning.  Final Clinical Impression(s) / ED Diagnoses Final diagnoses:  Evaluation by medical service required    Rx / DC Orders ED Discharge Orders    None       Aura Dials 12/31/20 2052    Valarie Merino, MD 01/02/21 2256

## 2020-12-31 NOTE — Discharge Instructions (Signed)
Trevor [name] was evaluated today in the emergency department for his wound in his right femoral area.  This wound appears to have healed completely and does not require any further wound care.  Additionally well-healed decubitus ulcer was examined in his sacral area, without any signs of infection or recurrent ulcer.    His blood work and EKG additionally were reassuring.  He is safe to return to his facility tonight.  He may return to the emergency department if he develops any new severe symptoms.

## 2020-12-31 NOTE — ED Notes (Addendum)
Documentation Error.

## 2021-01-03 ENCOUNTER — Ambulatory Visit: Payer: Medicare Other | Admitting: Podiatry

## 2021-01-15 ENCOUNTER — Other Ambulatory Visit: Payer: Self-pay

## 2021-01-15 ENCOUNTER — Ambulatory Visit (INDEPENDENT_AMBULATORY_CARE_PROVIDER_SITE_OTHER): Payer: Medicare Other | Admitting: Physician Assistant

## 2021-01-15 ENCOUNTER — Ambulatory Visit (HOSPITAL_COMMUNITY)
Admission: RE | Admit: 2021-01-15 | Discharge: 2021-01-15 | Disposition: A | Discharge status: Home / Self Care | Payer: Medicare Other | Source: Ambulatory Visit | Attending: Vascular Surgery | Admitting: Vascular Surgery

## 2021-01-15 VITALS — BP 124/61 | HR 69 | Temp 98.0°F | Resp 20 | Ht 70.0 in

## 2021-01-15 DIAGNOSIS — I739 Peripheral vascular disease, unspecified: Secondary | ICD-10-CM | POA: Diagnosis present

## 2021-01-15 DIAGNOSIS — N186 End stage renal disease: Secondary | ICD-10-CM

## 2021-01-15 DIAGNOSIS — R9389 Abnormal findings on diagnostic imaging of other specified body structures: Secondary | ICD-10-CM

## 2021-01-15 DIAGNOSIS — M25571 Pain in right ankle and joints of right foot: Secondary | ICD-10-CM

## 2021-01-15 DIAGNOSIS — Z992 Dependence on renal dialysis: Secondary | ICD-10-CM

## 2021-01-15 DIAGNOSIS — Z48812 Encounter for surgical aftercare following surgery on the circulatory system: Secondary | ICD-10-CM

## 2021-01-15 NOTE — Progress Notes (Addendum)
Office Note     CC:  follow up Requesting Provider:  Clinic, [facility]  HPI: Trevor [name] is a 75 y.o. ([birth year]) male who presents for follow up of peripheral artery disease. He has history of left AKA with previous axillary bifemoral bypass graft by Dr. Donnetta Hutching in 2018. He has a known chronic right SFA occlusion and had previously had some wounds on his right lower extremity. These were healed at his last visit in July of 2021. He otherwise was doing well at his last visit.   Today he presents for follow up with non invasive studies. He comes unaccompanied by any care taker or family members. He says he has been having pain in his right foot for several months now. It comes and goes but he has a place that has been very sore and tender to touch for over a month now. He says he has mentioned it to several people at his facility but no one has mentioned anything about it. He says the pain keeps him up at night and also will wake him from out of sleep. He mostly gets around in a wheel chair but does use right leg to assist with transfers. He has no new wounds that he knows of. He denies any pain in right calf or thigh. He does have some diminished motor to the right foot but he feels that has been like that for some time now. He also mentions that he has had a wound in his right groin for 2 months that has been draining bloody clear drainage. He says it is not overly painful but they have been keeping it covered with wash cloth at facility. He reports subjective fever and chills.   He has a left AV fistula which is working well. He reports no steal symptoms at this time  The pt is on a statin for cholesterol management.  The pt is on a daily aspirin.  Other AC: Plavix The pt is on BB, for hypertension.   The pt os not diabetic.  Tobacco hx: current everyday smoker  Past Medical History:  Diagnosis Date  . AAA (abdominal aortic aneurysm) ([place])   . Anemia   . Anxiety   . Arthritis     arms and back  . Blindness   . CHF (congestive heart failure) ([place])   . Coronary artery disease    mild   . 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 Diabetes mellitus without complication ([place])   . ESRD on hemodialysis ([place])    started HD 2014-15  . Headache(784.0)   . HTN (hypertension)   . Protein-calorie malnutrition ([place])   . STEMI (ST elevation myocardial infarction) ([place]) 11/11/2018  . Stroke [facility]) 01/2018   no residual deficits    Past Surgical History:  Procedure Laterality Date  . A/V FISTULAGRAM N/A 07/29/2018   Procedure: A/V FISTULAGRAM - left upper extremity;  Surgeon: Waynetta Sandy, MD;  Location: [place] [facility];  Service: Cardiovascular;  Laterality: N/A;  . AMPUTATION Left 03/01/2019   Procedure: AMPUTATION ABOVE KNEE;  Surgeon: Angelia Mould, MD;  Location: [place];  Service: Vascular;  Laterality: Left;  . AXILLARY-FEMORAL BYPASS GRAFT  06/28/2017   Procedure: BYPASS GRAFT RIGHT AXILLA-BIFEMORAL USING 8MM X 30CM AND 8MM X 60CM HEMASHIELD GOLD GRAFTS;  Surgeon: Rosetta Posner, MD;  Location: [place];  Service: Vascular;;  . CARDIAC CATHETERIZATION N/A 11/01/2016  Procedure: Left Heart Cath and Coronary Angiography;  Surgeon: Lorretta Harp, MD;  Location: [place] [facility];  Service: Cardiovascular;  Laterality: N/A;  . ENDARTERECTOMY FEMORAL Bilateral 06/28/2017   Procedure: ENDARTERECTOMY BILATERAL FEMORAL ARTERIES;  Surgeon: Rosetta Posner, MD;  Location: [place];  Service: Vascular;  Laterality: Bilateral;  . FISTULA SUPERFICIALIZATION Left [id number]   Procedure: PLICATION OF ARTERIOVENOUS FISTULA LEFT ARM;  Surgeon: Waynetta Sandy, MD;  Location: [place];  Service: Vascular;  Laterality: Left;  . FISTULOGRAM Left 05/20/2018   Procedure: FISTULOGRAM WITH BALLOON ANGIOPLASTY LEFT ARM ARTERIOVENOUS FISTULA;  Surgeon: Waynetta Sandy, MD;  Location: [place];  Service: Vascular;  Laterality:  Left;  . INSERTION OF DIALYSIS CATHETER  09/21/2012   Procedure: INSERTION OF DIALYSIS CATHETER;  Surgeon: Angelia Mould, MD;  Location: [place];  Service: Vascular;  Laterality: N/A;  Right Internal Jugular Placement  . LEFT HEART CATH N/A 11/11/2018   Procedure: LEFT HEART CATH;  Surgeon: Sherren Mocha, MD;  Location: [place] [facility];  Service: Cardiovascular;  Laterality: N/A;  . LEFT HEART CATHETERIZATION WITH CORONARY ANGIOGRAM N/A 09/28/2012   Procedure: LEFT HEART CATHETERIZATION WITH CORONARY ANGIOGRAM;  Surgeon: Sherren Mocha, MD;  Location: [facility] CATH LAB;  Service: Cardiovascular;  Laterality: N/A;  . LEFT HEART CATHETERIZATION WITH CORONARY ANGIOGRAM N/A 07/07/2014   Procedure: LEFT HEART CATHETERIZATION WITH CORONARY ANGIOGRAM;  Surgeon: Leonie Man, MD;  Location: [facility] CATH LAB;  Service: Cardiovascular;  Laterality: N/A;  . PERIPHERAL VASCULAR BALLOON ANGIOPLASTY Left 07/29/2018   Procedure: PERIPHERAL VASCULAR BALLOON ANGIOPLASTY;  Surgeon: Waynetta Sandy, MD;  Location: [place] CV LAB;  Service: Cardiovascular;  Laterality: Left;  CENTRAL VEIN  . PERIPHERAL VASCULAR INTERVENTION Left 07/29/2018   Procedure: PERIPHERAL VASCULAR INTERVENTION;  Surgeon: Waynetta Sandy, MD;  Location: [place] [facility];  Service: Cardiovascular;  Laterality: Left;  AXILLARY VEIN  . REVISION OF ARTERIOVENOUS GORETEX GRAFT Left 12/30/2018   Procedure: REVISION OF ARTERIOVENOUS FISTULA LEFT ARM;  Surgeon: Rosetta Posner, MD;  Location: [place];  Service: Vascular;  Laterality: Left;  . REVISON OF ARTERIOVENOUS FISTULA Left 08/17/2018   Procedure: REVISION OF ARTERIOVENOUS FISTULA LEFT ARM;  Surgeon: Waynetta Sandy, MD;  Location: [place];  Service: Vascular;  Laterality: Left;  . right hand    . TEE WITHOUT CARDIOVERSION  10/09/2011   Procedure: TRANSESOPHAGEAL ECHOCARDIOGRAM (TEE);  Surgeon: Lelon Perla, MD;  Location: [facility] ENDOSCOPY;  Service: Cardiovascular;   Laterality: N/A;    Social History   Socioeconomic History  . Marital status: Widowed    Spouse name: Not on file  . Number of children: 2  . Years of education: Not on file  . Highest education level: Not on file  Occupational History  . Not on file  Tobacco Use  . Smoking status: Current Every Day Smoker    Packs/day: 0.50    Years: 60.00    Pack years: 30.00    Types: Cigarettes  . Smokeless tobacco: Never Used  . Tobacco comment: 5-6 cigarettes per day  Vaping Use  . Vaping Use: Never used  Substance and Sexual Activity  . Alcohol use: No    Alcohol/week: 0.0 standard drinks    Comment: Occasional  . Drug use: No    Frequency: 2.0 times per week    Types: "Crack" cocaine  . Sexual activity: Yes  Other Topics Concern  . Not on file  Social History Narrative   Used to work in  a Loss adjuster, chartered.    Social Determinants of Health   Financial Resource Strain: Not on file  Food Insecurity: Not on file  Transportation Needs: Not on file  Physical Activity: Not on file  Stress: Not on file  Social Connections: Not on file  Intimate Partner Violence: Not on file    Family History  Problem Relation Age of Onset  . Heart attack Mother        MI in her 16s  . Diabetes Mother   . Alcohol abuse Father   . Anesthesia problems Neg Hx   . Hypotension Neg Hx   . Malignant hyperthermia Neg Hx   . Pseudochol deficiency Neg Hx     Current Outpatient Medications  Medication Sig Dispense Refill  . aspirin EC 81 MG tablet Take 1 tablet (81 mg total) by mouth daily. 90 tablet 3  . atorvastatin (LIPITOR) 80 MG tablet Take 1 tablet (80 mg total) by mouth at bedtime. 30 tablet 6  . b complex-vitamin c-folic acid (NEPHRO-VITE) 0.8 MG TABS tablet Take 1 tablet by mouth daily.    . bisacodyl (DULCOLAX) 10 MG suppository Place 10 mg rectally daily as needed (constipation).     . clopidogrel (PLAVIX) 75 MG tablet Take 1 tablet (75 mg total) by mouth daily. 30 tablet 2  . docusate  sodium (COLACE) 100 MG capsule Take 200 mg by mouth at bedtime.    . gabapentin (NEURONTIN) 300 MG capsule Take 1 capsule (300 mg total) by mouth at bedtime. 30 capsule 0  . Icosapent Ethyl (VASCEPA) 1 g CAPS Take 1 g by mouth 2 (two) times daily.     . Ipratropium-Albuterol (COMBIVENT IN) Inhale 2 puffs into the lungs every 8 (eight) hours as needed (shortness of breath, wheezing).    . isosorbide mononitrate (IMDUR) 30 MG 24 hr tablet Take 1 tablet (30 mg total) by mouth daily. 90 tablet 3  . metoprolol tartrate (LOPRESSOR) 25 MG tablet Take 0.5 tablets (12.5 mg total) by mouth 2 (two) times daily. 30 tablet 5  . mirtazapine (REMERON) 7.5 MG tablet Take 7.5 mg by mouth at bedtime.    . nitroGLYCERIN (NITROSTAT) 0.4 MG SL tablet Place 1 tablet (0.4 mg total) under the tongue every 5 (five) minutes x 3 doses as needed for chest pain. 25 tablet 12  . Nutritional Supplements (FEEDING SUPPLEMENT, NEPRO CARB STEADY,) LIQD Take 237 mLs by mouth 2 (two) times daily between meals.  0  . Nutritional Supplements (PROMOD) LIQD Take 30 mLs by mouth 2 (two) times a day.    . ondansetron (ZOFRAN) 4 MG tablet Take 4 mg by mouth every 6 (six) hours as needed for nausea or vomiting.    . polyethylene glycol powder (GLYCOLAX/MIRALAX) 17 GM/SCOOP powder Take 17 g by mouth daily. (in liquid)    . senna (SENOKOT) 8.6 MG TABS tablet Take 1 tablet by mouth daily.    . sevelamer carbonate (RENVELA) 800 MG tablet Take 1,600 mg by mouth 3 (three) times daily with meals.     . tiotropium (SPIRIVA HANDIHALER) 18 MCG inhalation capsule Place 18 mcg into inhaler and inhale daily.     No current facility-administered medications for this visit.    No Known Allergies   REVIEW OF SYSTEMS:  '[X]'$  denotes positive finding, '[ ]'$  denotes negative finding Cardiac  Comments:  Chest pain or chest pressure:    Shortness of breath upon exertion:    Short of breath when lying flat:    Irregular  heart rhythm:        Vascular     Pain in calf, thigh, or hip brought on by ambulation:    Pain in feet at night that wakes you up from your sleep:     Blood clot in your veins:    Leg swelling:         Pulmonary    Oxygen at home:    Productive cough:     Wheezing:         Neurologic    Sudden weakness in arms or legs:     Sudden numbness in arms or legs:     Sudden onset of difficulty speaking or slurred speech:    Temporary loss of vision in one eye:     Problems with dizziness:         Gastrointestinal    Blood in stool:     Vomited blood:         Genitourinary    Burning when urinating:     Blood in urine:        Psychiatric    Major depression:         Hematologic    Bleeding problems:    Problems with blood clotting too easily:        Skin    Rashes or ulcers:        Constitutional    Fever or chills:      PHYSICAL EXAMINATION:  Vitals:   01/15/21 1123  BP: 124/61  Pulse: 69  Resp: 20  Temp: 98 F (36.7 C)  TempSrc: Temporal  SpO2: 98%  Height: '5\' 10"'$  (1.778 m)    General:  WDWN in NAD; vital signs documented above Gait: in wheel chair HENT: WNL, normocephalic Pulmonary: normal non-labored breathing , without wheezing Cardiac: regular HR, without  Murmurs without carotid bruit Abdomen: soft, NT, no masses Palpable pulse in right Axillo bifemoral bypass graft RLQ  There is a sinus tract with small 1 cm x .5 cm hole with serosanguinous drainage on compression. Surrounding area is soft. No overlying skin changes. No erythema or warmth Vascular Exam/Pulses:  Right Left  Radial 2+ (normal) 2+ (normal)  Femoral 2+ (normal) 2+ (normal)  Popliteal Not palpable AKA  DP Not palpable AKA  PT Not palpable AKA  Left AV fistula with good thrill Extremities: with ischemic changes of the right, trophic nail changes, darkening of the skin of the foot, without cellulitis; without open wounds; right foot is warm. Sensation and motor are diminished.   Very tender to touch over MTP joint of  right 1st toe. Some visible ulcerations. No drainage on compression Left AKA well healed Musculoskeletal: no muscle wasting or atrophy  Neurologic: A&O X 3;  No focal weakness or paresthesias are detected Psychiatric:  The pt has Normal affect.   Non-Invasive Vascular Imaging:   +-------+-----------+-----------+------------+------------+  ABI/TBIToday's ABIToday's TBIPrevious ABIPrevious TBI  +-------+-----------+-----------+------------+------------+  Right 1.01    0     [place]     0.37      +-------+-----------+-----------+------------+------------+    ASSESSMENT/PLAN:: 75 y.o. male here for follow up for peripheral artery disease. He has history of left AKA with previous axillary bifemoral bypass graft by Dr. Donnetta Hutching in 2018. He has a known chronic right SFA occlusion and had previously had some wounds on his right lower extremity. His ABI/ TBI is essentially unchanged from his prior visit. The right TBI has dropped significantly but very similar to previous TBIs. He has rest pain presently  and also area on the right 1st MTP joint that is very tender to touch with some visible ulcers. I am concerned he may have some osteomyelitis in this area - I have ordered a right foot x-ray to see if there is any evidence of osteomyelitis -He has a draining sinus tract in the right groin/ right lower quadrant of abdomen that is concerning for deeper abscess or fluid collection and with history of Axillo bifemoral bypass graft I am concerned about this tract going down to level of graft - I have scheduled him to have a CT abdomen and pelvis for evaluation of this area - He will follow up in 1-2 weeks with Dr. Oneida Alar to review CT and right foot radiographs and discuss further management   Karoline Caldwell, PA-C [facility] [phone]  Clinic MD:  Dr. Carlis Abbott Dr. Stanford Breed

## 2021-01-29 ENCOUNTER — Ambulatory Visit
Admission: RE | Admit: 2021-01-29 | Discharge: 2021-01-29 | Disposition: A | Discharge status: Home / Self Care | Payer: Medicare Other | Source: Ambulatory Visit | Attending: Vascular Surgery | Admitting: Vascular Surgery

## 2021-01-29 DIAGNOSIS — I739 Peripheral vascular disease, unspecified: Secondary | ICD-10-CM

## 2021-01-29 DIAGNOSIS — Z992 Dependence on renal dialysis: Secondary | ICD-10-CM

## 2021-01-29 DIAGNOSIS — Z48812 Encounter for surgical aftercare following surgery on the circulatory system: Secondary | ICD-10-CM

## 2021-01-29 DIAGNOSIS — R9389 Abnormal findings on diagnostic imaging of other specified body structures: Secondary | ICD-10-CM

## 2021-01-29 DIAGNOSIS — N186 End stage renal disease: Secondary | ICD-10-CM

## 2021-01-29 DIAGNOSIS — M25571 Pain in right ankle and joints of right foot: Secondary | ICD-10-CM

## 2021-01-29 MED ORDER — IOPAMIDOL (ISOVUE-300) INJECTION 61%
100.0000 mL | Freq: Once | INTRAVENOUS | Status: AC | PRN
  Administered 2021-01-29: 100 mL via INTRAVENOUS

## 2021-01-31 ENCOUNTER — Other Ambulatory Visit: Payer: Self-pay

## 2021-01-31 ENCOUNTER — Ambulatory Visit (INDEPENDENT_AMBULATORY_CARE_PROVIDER_SITE_OTHER): Payer: Medicare Other | Admitting: Vascular Surgery

## 2021-01-31 ENCOUNTER — Encounter (HOSPITAL_COMMUNITY): Payer: Self-pay | Admitting: Vascular Surgery

## 2021-01-31 ENCOUNTER — Encounter: Payer: Self-pay | Admitting: Vascular Surgery

## 2021-01-31 VITALS — BP 136/51 | HR 63 | Temp 98.0°F | Resp 18 | Ht 70.0 in | Wt 140.0 lb

## 2021-01-31 DIAGNOSIS — T827XXD Infection and inflammatory reaction due to other cardiac and vascular devices, implants and grafts, subsequent encounter: Secondary | ICD-10-CM | POA: Diagnosis not present

## 2021-01-31 NOTE — Progress Notes (Signed)
Patient is a 75 year old male who returns for follow-up today.  He was seen in our APP clinic on January 15, 2021.  At that time he was having some drainage from his right groin.  It has been going on for about 6 to 8 weeks.  He has previously had a right axillary bifemoral bypass.  This was done by Dr. Donnetta Hutching in 2018.  Drainage has persisted.  He does not have any fever or chills.  He is nonambulatory and has been in a wheelchair for some time.  He needs a fair amount of assistance just to transfer.  Other chronic medical problems include end-stage renal disease currently Monday Wednesday Friday dialysis at [place].  He also has known abdominal aortic aneurysm.  Last diameter was 3.8 cm.  Currently resides in a skilled nursing facility.  Past Medical History:  Diagnosis Date  . AAA (abdominal aortic aneurysm) ([place])   . Anemia   . Anxiety   . Arthritis    arms and back  . Blindness   . CHF (congestive heart failure) ([place])   . Coronary artery disease    mild   . 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 Diabetes mellitus without complication ([place])   . ESRD on hemodialysis ([place])    started HD 2014-15  . Headache(784.0)   . HTN (hypertension)   . Protein-calorie malnutrition ([place])   . STEMI (ST elevation myocardial infarction) ([place]) 11/11/2018  . Stroke [facility]) 01/2018   no residual deficits   Past Surgical History:  Procedure Laterality Date  . A/V FISTULAGRAM N/A 07/29/2018   Procedure: A/V FISTULAGRAM - left upper extremity;  Surgeon: Waynetta Sandy, MD;  Location: [place] [facility];  Service: Cardiovascular;  Laterality: N/A;  . AMPUTATION Left 03/01/2019   Procedure: AMPUTATION ABOVE KNEE;  Surgeon: Angelia Mould, MD;  Location: [place];  Service: Vascular;  Laterality: Left;  . AXILLARY-FEMORAL BYPASS GRAFT  06/28/2017   Procedure: BYPASS GRAFT RIGHT AXILLA-BIFEMORAL USING 8MM X 30CM AND 8MM X 60CM HEMASHIELD GOLD GRAFTS;   Surgeon: Rosetta Posner, MD;  Location: [place];  Service: Vascular;;  . CARDIAC CATHETERIZATION N/A 11/01/2016   Procedure: Left Heart Cath and Coronary Angiography;  Surgeon: Lorretta Harp, MD;  Location: [place] [facility];  Service: Cardiovascular;  Laterality: N/A;  . ENDARTERECTOMY FEMORAL Bilateral 06/28/2017   Procedure: ENDARTERECTOMY BILATERAL FEMORAL ARTERIES;  Surgeon: Rosetta Posner, MD;  Location: [place];  Service: Vascular;  Laterality: Bilateral;  . FISTULA SUPERFICIALIZATION Left [id number]   Procedure: PLICATION OF ARTERIOVENOUS FISTULA LEFT ARM;  Surgeon: Waynetta Sandy, MD;  Location: [place];  Service: Vascular;  Laterality: Left;  . FISTULOGRAM Left 05/20/2018   Procedure: FISTULOGRAM WITH BALLOON ANGIOPLASTY LEFT ARM ARTERIOVENOUS FISTULA;  Surgeon: Waynetta Sandy, MD;  Location: [place];  Service: Vascular;  Laterality: Left;  . INSERTION OF DIALYSIS CATHETER  09/21/2012   Procedure: INSERTION OF DIALYSIS CATHETER;  Surgeon: Angelia Mould, MD;  Location: [place];  Service: Vascular;  Laterality: N/A;  Right Internal Jugular Placement  . LEFT HEART CATH N/A 11/11/2018   Procedure: LEFT HEART CATH;  Surgeon: Sherren Mocha, MD;  Location: [place] CV LAB;  Service: Cardiovascular;  Laterality: N/A;  . LEFT HEART CATHETERIZATION WITH CORONARY ANGIOGRAM N/A 09/28/2012   Procedure: LEFT HEART CATHETERIZATION WITH CORONARY ANGIOGRAM;  Surgeon: Sherren Mocha, MD;  Location: [facility] CATH LAB;  Service: Cardiovascular;  Laterality: N/A;  . LEFT HEART CATHETERIZATION WITH CORONARY ANGIOGRAM N/A 07/07/2014   Procedure: LEFT HEART CATHETERIZATION WITH CORONARY ANGIOGRAM;  Surgeon: Leonie Man, MD;  Location: [facility] CATH LAB;  Service: Cardiovascular;  Laterality: N/A;  . PERIPHERAL VASCULAR BALLOON ANGIOPLASTY Left 07/29/2018   Procedure: PERIPHERAL VASCULAR BALLOON ANGIOPLASTY;  Surgeon: Waynetta Sandy, MD;  Location: [place] CV LAB;  Service:  Cardiovascular;  Laterality: Left;  CENTRAL VEIN  . PERIPHERAL VASCULAR INTERVENTION Left 07/29/2018   Procedure: PERIPHERAL VASCULAR INTERVENTION;  Surgeon: Waynetta Sandy, MD;  Location: [place] CV LAB;  Service: Cardiovascular;  Laterality: Left;  AXILLARY VEIN  . REVISION OF ARTERIOVENOUS GORETEX GRAFT Left 12/30/2018   Procedure: REVISION OF ARTERIOVENOUS FISTULA LEFT ARM;  Surgeon: Rosetta Posner, MD;  Location: [place];  Service: Vascular;  Laterality: Left;  . REVISON OF ARTERIOVENOUS FISTULA Left 08/17/2018   Procedure: REVISION OF ARTERIOVENOUS FISTULA LEFT ARM;  Surgeon: Waynetta Sandy, MD;  Location: [place];  Service: Vascular;  Laterality: Left;  . right hand    . TEE WITHOUT CARDIOVERSION  10/09/2011   Procedure: TRANSESOPHAGEAL ECHOCARDIOGRAM (TEE);  Surgeon: Lelon Perla, MD;  Location: [facility] ENDOSCOPY;  Service: Cardiovascular;  Laterality: N/A;    Current Outpatient Medications on File Prior to Visit  Medication Sig Dispense Refill  . aspirin EC 81 MG tablet Take 1 tablet (81 mg total) by mouth daily. 90 tablet 3  . atorvastatin (LIPITOR) 80 MG tablet Take 1 tablet (80 mg total) by mouth at bedtime. 30 tablet 6  . b complex-vitamin c-folic acid (NEPHRO-VITE) 0.8 MG TABS tablet Take 1 tablet by mouth daily.    . bisacodyl (DULCOLAX) 10 MG suppository Place 10 mg rectally daily as needed (constipation).     . clopidogrel (PLAVIX) 75 MG tablet Take 1 tablet (75 mg total) by mouth daily. 30 tablet 2  . docusate sodium (COLACE) 100 MG capsule Take 200 mg by mouth at bedtime.    . gabapentin (NEURONTIN) 300 MG capsule Take 1 capsule (300 mg total) by mouth at bedtime. 30 capsule 0  . Icosapent Ethyl (VASCEPA) 1 g CAPS Take 1 g by mouth 2 (two) times daily.     . Ipratropium-Albuterol (COMBIVENT IN) Inhale 2 puffs into the lungs every 8 (eight) hours as needed (shortness of breath, wheezing).    . isosorbide mononitrate (IMDUR) 30 MG 24 hr tablet Take 1 tablet (30  mg total) by mouth daily. 90 tablet 3  . metoprolol tartrate (LOPRESSOR) 25 MG tablet Take 0.5 tablets (12.5 mg total) by mouth 2 (two) times daily. 30 tablet 5  . mirtazapine (REMERON) 7.5 MG tablet Take 7.5 mg by mouth at bedtime.    . nitroGLYCERIN (NITROSTAT) 0.4 MG SL tablet Place 1 tablet (0.4 mg total) under the tongue every 5 (five) minutes x 3 doses as needed for chest pain. 25 tablet 12  . Nutritional Supplements (FEEDING SUPPLEMENT, NEPRO CARB STEADY,) LIQD Take 237 mLs by mouth 2 (two) times daily between meals.  0  . Nutritional Supplements (PROMOD) LIQD Take 30 mLs by mouth 2 (two) times a day.    . ondansetron (ZOFRAN) 4 MG tablet Take 4 mg by mouth every 6 (six) hours as needed for nausea or vomiting.    . polyethylene glycol powder (GLYCOLAX/MIRALAX) 17 GM/SCOOP powder Take 17 g by mouth daily. (in liquid)    . senna (SENOKOT) 8.6 MG TABS tablet Take 1 tablet by mouth daily.    . sevelamer carbonate (  RENVELA) 800 MG tablet Take 1,600 mg by mouth 3 (three) times daily with meals.     . tiotropium (SPIRIVA HANDIHALER) 18 MCG inhalation capsule Place 18 mcg into inhaler and inhale daily.     No current facility-administered medications on file prior to visit.    Social History   Socioeconomic History  . Marital status: Widowed    Spouse name: Not on file  . Number of children: 2  . Years of education: Not on file  . Highest education level: Not on file  Occupational History  . Not on file  Tobacco Use  . Smoking status: Current Every Day Smoker    Packs/day: 0.50    Years: 60.00    Pack years: 30.00    Types: Cigarettes  . Smokeless tobacco: Never Used  . Tobacco comment: 5-6 cigarettes per day  Vaping Use  . Vaping Use: Never used  Substance and Sexual Activity  . Alcohol use: No    Alcohol/week: 0.0 standard drinks    Comment: Occasional  . Drug use: No    Frequency: 2.0 times per week    Types: "Crack" cocaine  . Sexual activity: Yes  Other Topics Concern   . Not on file  Social History Narrative   Used to work in a Loss adjuster, chartered.    Social Determinants of Health   Financial Resource Strain: Not on file  Food Insecurity: Not on file  Transportation Needs: Not on file  Physical Activity: Not on file  Stress: Not on file  Social Connections: Not on file  Intimate Partner Violence: Not on file    Physical exam:  Vitals:   01/31/21 1123  BP: (!) 136/51  Pulse: 63  Resp: 18  Temp: 98 F (36.7 C)  SpO2: 98%  Weight: 140 lb (63.5 kg)  Height: '5\' 10"'$  (1.778 m)    Extremities: Well-healed left above-knee amputation, right foot is slightly cool no open wounds no palpable pedal pulses.  Palpable thrill left upper arm graft  Abdomen: Palpable graft pulse in his right axillary femoral bypass and across the femoral-femoral.  There is a large amount of serous particulate matter draining from the right groin.  There is no erythema in the right or left groin or over the graft.  Data: Patient had a CT scan of the abdomen and pelvis on January 29, 2021.  This showed a fluid collection over the left groin.  There was stranding around the right groin.  There is also fluid tracking along most of the course of the axillary bifemoral bypass graft.  There are chronic occlusions of the right and left superficial femoral artery.  Aneurysm diameter was 3.8 cm.  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   Patient's most recent ABI was January 15, 2021.  This showed an ABI of 1.01 absent toe pressure.  Waveforms were dampened and monophasic.  Assessment: Infected axillary bifemoral bypass  Plan: Patient will have removal of his axillary bifemoral bypass tomorrow by Dr. Donnetta Hutching.  I discussed the patient that he is very high risk for above-knee amputation of his right leg.  He understands and agrees to proceed.  Due to the patient's multiple comorbidities and other medical problems we will work on getting him admitted to the  hospitalist service post procedure.  We attempted to get the patient direct admitted today but unfortunately there were no available beds at the hospital.  He does not currently appear septic.  We are going to  touch base with his dialysis center and make them a decision regarding whether or not to do dialysis today versus tomorrow after his operation.  Ruta Hinds, MD [facility] [place] Office: [phone]

## 2021-01-31 NOTE — Progress Notes (Addendum)
PCP - [facility] and physician at facility pt stays at  [place], [place]  Chest x-ray -  EKG - 01/16/21 Stress Test -  ECHO -  Cardiac Cath - 11/11/16  Per facility chart, they had pt as diabetic but pt is not on any diabetic medications at the facility   Blood Thinner Instructions: per facility unit manager last dose Plavix was 01/31/21 in the morning  Aspirin Instructions: per facility unit manager last dose ASA was 01/31/21 in the morning   Facility unit manger aware that ASA to be continued and no Plavix day of surgery per [facility] protocols   COVID TEST- DOS  Anesthesia review:   -------------  SDW INSTRUCTIONS:  Your procedure is scheduled on Friday February 01, 2021 . Please report to [facility] Main Entrance "A" at 0730 A.M., and check in at the Admitting office. Call this number if you have problems the morning of surgery: [phone]   Remember: Do not eat or drink after midnight the night before your surgery   Medications to take morning of surgery with a sip of water include: aspirin EC gabapentin (NEURONTIN)  isosorbide mononitrate (IMDUR)  metoprolol tartrate (LOPRESSOR)  If needed: nitroGLYCERIN (NITROSTAT)     As of today, STOP taking any Aleve, Naproxen, Ibuprofen, Motrin, Advil, Goody's, BC's, all herbal medications, fish oil, and all vitamins.  Per facility pt last dose ASA and Plavix was 01/31/21   The Morning of Surgery Do not wear jewelry Do not wear lotions, powders, colognes, or deodorant Men may shave face and neck. Do not bring valuables to the hospital. [facility] is not responsible for any belongings or valuables. If you are a smoker, DO NOT Smoke 24 hours prior to surgery If you wear a CPAP at night please bring your mask the morning of surgery  Remember that you must have someone to transport you home after your surgery, and remain with you for 24 hours if you are discharged the same day. Please bring cases for contacts, glasses,  hearing aids, dentures or bridgework because it cannot be worn into surgery.   Patients discharged the day of surgery will not be allowed to drive home.   Please shower the NIGHT BEFORE SURGERY and the MORNING OF SURGERY with DIAL Soap. Wear comfortable clothes the morning of surgery. Oral Hygiene is also important to reduce your risk of infection.  Remember - BRUSH YOUR TEETH THE MORNING OF SURGERY WITH YOUR REGULAR TOOTHPASTE  Patient denies shortness of breath, fever, cough and chest pain.

## 2021-01-31 NOTE — H&P (View-Only) (Signed)
Patient is a 75 year old male who returns for follow-up today.  He was seen in our APP clinic on January 15, 2021.  At that time he was having some drainage from his right groin.  It has been going on for about 6 to 8 weeks.  He has previously had a right axillary bifemoral bypass.  This was done by Dr. Donnetta Hutching in 2018.  Drainage has persisted.  He does not have any fever or chills.  He is nonambulatory and has been in a wheelchair for some time.  He needs a fair amount of assistance just to transfer.  Other chronic medical problems include end-stage renal disease currently Monday Wednesday Friday dialysis at [place].  He also has known abdominal aortic aneurysm.  Last diameter was 3.8 cm.  Currently resides in a skilled nursing facility.  Past Medical History:  Diagnosis Date  . AAA (abdominal aortic aneurysm) ([place])   . Anemia   . Anxiety   . Arthritis    arms and back  . Blindness   . CHF (congestive heart failure) ([place])   . Coronary artery disease    mild   . 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 Diabetes mellitus without complication ([place])   . ESRD on hemodialysis ([place])    started HD 2014-15  . Headache(784.0)   . HTN (hypertension)   . Protein-calorie malnutrition ([place])   . STEMI (ST elevation myocardial infarction) ([place]) 11/11/2018  . Stroke [facility]) 01/2018   no residual deficits   Past Surgical History:  Procedure Laterality Date  . A/V FISTULAGRAM N/A 07/29/2018   Procedure: A/V FISTULAGRAM - left upper extremity;  Surgeon: Waynetta Sandy, MD;  Location: [place] CV LAB;  Service: Cardiovascular;  Laterality: N/A;  . AMPUTATION Left 03/01/2019   Procedure: AMPUTATION ABOVE KNEE;  Surgeon: Angelia Mould, MD;  Location: [place];  Service: Vascular;  Laterality: Left;  . AXILLARY-FEMORAL BYPASS GRAFT  06/28/2017   Procedure: BYPASS GRAFT RIGHT AXILLA-BIFEMORAL USING 8MM X 30CM AND 8MM X 60CM HEMASHIELD GOLD GRAFTS;   Surgeon: Rosetta Posner, MD;  Location: [place];  Service: Vascular;;  . CARDIAC CATHETERIZATION N/A 11/01/2016   Procedure: Left Heart Cath and Coronary Angiography;  Surgeon: Lorretta Harp, MD;  Location: [place] CV LAB;  Service: Cardiovascular;  Laterality: N/A;  . ENDARTERECTOMY FEMORAL Bilateral 06/28/2017   Procedure: ENDARTERECTOMY BILATERAL FEMORAL ARTERIES;  Surgeon: Rosetta Posner, MD;  Location: [place];  Service: Vascular;  Laterality: Bilateral;  . FISTULA SUPERFICIALIZATION Left [id number]   Procedure: PLICATION OF ARTERIOVENOUS FISTULA LEFT ARM;  Surgeon: Waynetta Sandy, MD;  Location: [place];  Service: Vascular;  Laterality: Left;  . FISTULOGRAM Left 05/20/2018   Procedure: FISTULOGRAM WITH BALLOON ANGIOPLASTY LEFT ARM ARTERIOVENOUS FISTULA;  Surgeon: Waynetta Sandy, MD;  Location: [place];  Service: Vascular;  Laterality: Left;  . INSERTION OF DIALYSIS CATHETER  09/21/2012   Procedure: INSERTION OF DIALYSIS CATHETER;  Surgeon: Angelia Mould, MD;  Location: [place];  Service: Vascular;  Laterality: N/A;  Right Internal Jugular Placement  . LEFT HEART CATH N/A 11/11/2018   Procedure: LEFT HEART CATH;  Surgeon: Sherren Mocha, MD;  Location: [place] CV LAB;  Service: Cardiovascular;  Laterality: N/A;  . LEFT HEART CATHETERIZATION WITH CORONARY ANGIOGRAM N/A 09/28/2012   Procedure: LEFT HEART CATHETERIZATION WITH CORONARY ANGIOGRAM;  Surgeon: Sherren Mocha, MD;  Location: [facility] CATH LAB;  Service: Cardiovascular;  Laterality: N/A;  . LEFT HEART CATHETERIZATION WITH CORONARY ANGIOGRAM N/A 07/07/2014   Procedure: LEFT HEART CATHETERIZATION WITH CORONARY ANGIOGRAM;  Surgeon: Leonie Man, MD;  Location: [facility] CATH LAB;  Service: Cardiovascular;  Laterality: N/A;  . PERIPHERAL VASCULAR BALLOON ANGIOPLASTY Left 07/29/2018   Procedure: PERIPHERAL VASCULAR BALLOON ANGIOPLASTY;  Surgeon: Waynetta Sandy, MD;  Location: [place] CV LAB;  Service:  Cardiovascular;  Laterality: Left;  CENTRAL VEIN  . PERIPHERAL VASCULAR INTERVENTION Left 07/29/2018   Procedure: PERIPHERAL VASCULAR INTERVENTION;  Surgeon: Waynetta Sandy, MD;  Location: [place] CV LAB;  Service: Cardiovascular;  Laterality: Left;  AXILLARY VEIN  . REVISION OF ARTERIOVENOUS GORETEX GRAFT Left 12/30/2018   Procedure: REVISION OF ARTERIOVENOUS FISTULA LEFT ARM;  Surgeon: Rosetta Posner, MD;  Location: [place];  Service: Vascular;  Laterality: Left;  . REVISON OF ARTERIOVENOUS FISTULA Left 08/17/2018   Procedure: REVISION OF ARTERIOVENOUS FISTULA LEFT ARM;  Surgeon: Waynetta Sandy, MD;  Location: [place];  Service: Vascular;  Laterality: Left;  . right hand    . TEE WITHOUT CARDIOVERSION  10/09/2011   Procedure: TRANSESOPHAGEAL ECHOCARDIOGRAM (TEE);  Surgeon: Lelon Perla, MD;  Location: [facility] ENDOSCOPY;  Service: Cardiovascular;  Laterality: N/A;    Current Outpatient Medications on File Prior to Visit  Medication Sig Dispense Refill  . aspirin EC 81 MG tablet Take 1 tablet (81 mg total) by mouth daily. 90 tablet 3  . atorvastatin (LIPITOR) 80 MG tablet Take 1 tablet (80 mg total) by mouth at bedtime. 30 tablet 6  . b complex-vitamin c-folic acid (NEPHRO-VITE) 0.8 MG TABS tablet Take 1 tablet by mouth daily.    . bisacodyl (DULCOLAX) 10 MG suppository Place 10 mg rectally daily as needed (constipation).     . clopidogrel (PLAVIX) 75 MG tablet Take 1 tablet (75 mg total) by mouth daily. 30 tablet 2  . docusate sodium (COLACE) 100 MG capsule Take 200 mg by mouth at bedtime.    . gabapentin (NEURONTIN) 300 MG capsule Take 1 capsule (300 mg total) by mouth at bedtime. 30 capsule 0  . Icosapent Ethyl (VASCEPA) 1 g CAPS Take 1 g by mouth 2 (two) times daily.     . Ipratropium-Albuterol (COMBIVENT IN) Inhale 2 puffs into the lungs every 8 (eight) hours as needed (shortness of breath, wheezing).    . isosorbide mononitrate (IMDUR) 30 MG 24 hr tablet Take 1 tablet (30  mg total) by mouth daily. 90 tablet 3  . metoprolol tartrate (LOPRESSOR) 25 MG tablet Take 0.5 tablets (12.5 mg total) by mouth 2 (two) times daily. 30 tablet 5  . mirtazapine (REMERON) 7.5 MG tablet Take 7.5 mg by mouth at bedtime.    . nitroGLYCERIN (NITROSTAT) 0.4 MG SL tablet Place 1 tablet (0.4 mg total) under the tongue every 5 (five) minutes x 3 doses as needed for chest pain. 25 tablet 12  . Nutritional Supplements (FEEDING SUPPLEMENT, NEPRO CARB STEADY,) LIQD Take 237 mLs by mouth 2 (two) times daily between meals.  0  . Nutritional Supplements (PROMOD) LIQD Take 30 mLs by mouth 2 (two) times a day.    . ondansetron (ZOFRAN) 4 MG tablet Take 4 mg by mouth every 6 (six) hours as needed for nausea or vomiting.    . polyethylene glycol powder (GLYCOLAX/MIRALAX) 17 GM/SCOOP powder Take 17 g by mouth daily. (in liquid)    . senna (SENOKOT) 8.6 MG TABS tablet Take 1 tablet by mouth daily.    . sevelamer carbonate (  RENVELA) 800 MG tablet Take 1,600 mg by mouth 3 (three) times daily with meals.     . tiotropium (SPIRIVA HANDIHALER) 18 MCG inhalation capsule Place 18 mcg into inhaler and inhale daily.     No current facility-administered medications on file prior to visit.    Social History   Socioeconomic History  . Marital status: Widowed    Spouse name: Not on file  . Number of children: 2  . Years of education: Not on file  . Highest education level: Not on file  Occupational History  . Not on file  Tobacco Use  . Smoking status: Current Every Day Smoker    Packs/day: 0.50    Years: 60.00    Pack years: 30.00    Types: Cigarettes  . Smokeless tobacco: Never Used  . Tobacco comment: 5-6 cigarettes per day  Vaping Use  . Vaping Use: Never used  Substance and Sexual Activity  . Alcohol use: No    Alcohol/week: 0.0 standard drinks    Comment: Occasional  . Drug use: No    Frequency: 2.0 times per week    Types: "Crack" cocaine  . Sexual activity: Yes  Other Topics Concern   . Not on file  Social History Narrative   Used to work in a Loss adjuster, chartered.    Social Determinants of Health   Financial Resource Strain: Not on file  Food Insecurity: Not on file  Transportation Needs: Not on file  Physical Activity: Not on file  Stress: Not on file  Social Connections: Not on file  Intimate Partner Violence: Not on file    Physical exam:  Vitals:   01/31/21 1123  BP: (!) 136/51  Pulse: 63  Resp: 18  Temp: 98 F (36.7 C)  SpO2: 98%  Weight: 140 lb (63.5 kg)  Height: '5\' 10"'$  (1.778 m)    Extremities: Well-healed left above-knee amputation, right foot is slightly cool no open wounds no palpable pedal pulses.  Palpable thrill left upper arm graft  Abdomen: Palpable graft pulse in his right axillary femoral bypass and across the femoral-femoral.  There is a large amount of serous particulate matter draining from the right groin.  There is no erythema in the right or left groin or over the graft.  Data: Patient had a CT scan of the abdomen and pelvis on January 29, 2021.  This showed a fluid collection over the left groin.  There was stranding around the right groin.  There is also fluid tracking along most of the course of the axillary bifemoral bypass graft.  There are chronic occlusions of the right and left superficial femoral artery.  Aneurysm diameter was 3.8 cm.  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   Patient's most recent ABI was January 15, 2021.  This showed an ABI of 1.01 absent toe pressure.  Waveforms were dampened and monophasic.  Assessment: Infected axillary bifemoral bypass  Plan: Patient will have removal of his axillary bifemoral bypass tomorrow by Dr. Donnetta Hutching.  I discussed the patient that he is very high risk for above-knee amputation of his right leg.  He understands and agrees to proceed.  Due to the patient's multiple comorbidities and other medical problems we will work on getting him admitted to the  hospitalist service post procedure.  We attempted to get the patient direct admitted today but unfortunately there were no available beds at the hospital.  He does not currently appear septic.  We are going to  touch base with his dialysis center and make them a decision regarding whether or not to do dialysis today versus tomorrow after his operation.  Ruta Hinds, MD [facility] [place] Office: [phone]

## 2021-02-01 ENCOUNTER — Encounter (HOSPITAL_COMMUNITY): Payer: Self-pay | Admitting: Vascular Surgery

## 2021-02-01 ENCOUNTER — Inpatient Hospital Stay (HOSPITAL_COMMUNITY): Payer: Medicare Other | Admitting: Anesthesiology

## 2021-02-01 ENCOUNTER — Inpatient Hospital Stay (HOSPITAL_COMMUNITY): Payer: Medicare Other

## 2021-02-01 ENCOUNTER — Encounter (HOSPITAL_COMMUNITY)
Admission: RE | Disposition: A | Discharge status: Hospice | Payer: Self-pay | Source: Home / Self Care | Attending: Internal Medicine

## 2021-02-01 ENCOUNTER — Other Ambulatory Visit: Payer: Self-pay

## 2021-02-01 ENCOUNTER — Inpatient Hospital Stay (HOSPITAL_COMMUNITY)
Admission: RE | Admit: 2021-02-01 | Discharge: 2021-02-05 | DRG: 252 | Disposition: A | Discharge status: Hospice | Payer: Medicare Other | Attending: Internal Medicine | Admitting: Internal Medicine

## 2021-02-01 DIAGNOSIS — I5042 Chronic combined systolic (congestive) and diastolic (congestive) heart failure: Secondary | ICD-10-CM | POA: Diagnosis present

## 2021-02-01 DIAGNOSIS — Z811 Family history of alcohol abuse and dependence: Secondary | ICD-10-CM

## 2021-02-01 DIAGNOSIS — E1122 Type 2 diabetes mellitus with diabetic chronic kidney disease: Secondary | ICD-10-CM | POA: Diagnosis present

## 2021-02-01 DIAGNOSIS — R0602 Shortness of breath: Secondary | ICD-10-CM | POA: Diagnosis not present

## 2021-02-01 DIAGNOSIS — H547 Unspecified visual loss: Secondary | ICD-10-CM | POA: Diagnosis present

## 2021-02-01 DIAGNOSIS — Z66 Do not resuscitate: Secondary | ICD-10-CM | POA: Diagnosis present

## 2021-02-01 DIAGNOSIS — Y832 Surgical operation with anastomosis, bypass or graft as the cause of abnormal reaction of the patient, or of later complication, without mention of misadventure at the time of the procedure: Secondary | ICD-10-CM | POA: Diagnosis present

## 2021-02-01 DIAGNOSIS — I714 Abdominal aortic aneurysm, without rupture, unspecified: Secondary | ICD-10-CM | POA: Diagnosis present

## 2021-02-01 DIAGNOSIS — I480 Paroxysmal atrial fibrillation: Secondary | ICD-10-CM | POA: Diagnosis present

## 2021-02-01 DIAGNOSIS — I132 Hypertensive heart and chronic kidney disease with heart failure and with stage 5 chronic kidney disease, or end stage renal disease: Secondary | ICD-10-CM | POA: Diagnosis present

## 2021-02-01 DIAGNOSIS — Z993 Dependence on wheelchair: Secondary | ICD-10-CM

## 2021-02-01 DIAGNOSIS — F17210 Nicotine dependence, cigarettes, uncomplicated: Secondary | ICD-10-CM | POA: Diagnosis present

## 2021-02-01 DIAGNOSIS — Z515 Encounter for palliative care: Secondary | ICD-10-CM

## 2021-02-01 DIAGNOSIS — F419 Anxiety disorder, unspecified: Secondary | ICD-10-CM | POA: Diagnosis present

## 2021-02-01 DIAGNOSIS — D631 Anemia in chronic kidney disease: Secondary | ICD-10-CM | POA: Diagnosis present

## 2021-02-01 DIAGNOSIS — I69312 Visuospatial deficit and spatial neglect following cerebral infarction: Secondary | ICD-10-CM

## 2021-02-01 DIAGNOSIS — Z7951 Long term (current) use of inhaled steroids: Secondary | ICD-10-CM

## 2021-02-01 DIAGNOSIS — F32A Depression, unspecified: Secondary | ICD-10-CM | POA: Diagnosis present

## 2021-02-01 DIAGNOSIS — Z89612 Acquired absence of left leg above knee: Secondary | ICD-10-CM

## 2021-02-01 DIAGNOSIS — I10 Essential (primary) hypertension: Secondary | ICD-10-CM | POA: Diagnosis present

## 2021-02-01 DIAGNOSIS — Z20822 Contact with and (suspected) exposure to covid-19: Secondary | ICD-10-CM | POA: Diagnosis present

## 2021-02-01 DIAGNOSIS — I999 Unspecified disorder of circulatory system: Secondary | ICD-10-CM | POA: Diagnosis present

## 2021-02-01 DIAGNOSIS — D62 Acute posthemorrhagic anemia: Secondary | ICD-10-CM | POA: Diagnosis not present

## 2021-02-01 DIAGNOSIS — T82898A Other specified complication of vascular prosthetic devices, implants and grafts, initial encounter: Secondary | ICD-10-CM | POA: Diagnosis not present

## 2021-02-01 DIAGNOSIS — Z8249 Family history of ischemic heart disease and other diseases of the circulatory system: Secondary | ICD-10-CM

## 2021-02-01 DIAGNOSIS — T827XXA Infection and inflammatory reaction due to other cardiac and vascular devices, implants and grafts, initial encounter: Principal | ICD-10-CM | POA: Diagnosis present

## 2021-02-01 DIAGNOSIS — R64 Cachexia: Secondary | ICD-10-CM | POA: Diagnosis present

## 2021-02-01 DIAGNOSIS — I160 Hypertensive urgency: Secondary | ICD-10-CM | POA: Diagnosis not present

## 2021-02-01 DIAGNOSIS — I959 Hypotension, unspecified: Secondary | ICD-10-CM | POA: Diagnosis not present

## 2021-02-01 DIAGNOSIS — I2510 Atherosclerotic heart disease of native coronary artery without angina pectoris: Secondary | ICD-10-CM | POA: Diagnosis present

## 2021-02-01 DIAGNOSIS — M25559 Pain in unspecified hip: Secondary | ICD-10-CM

## 2021-02-01 DIAGNOSIS — N186 End stage renal disease: Secondary | ICD-10-CM

## 2021-02-01 DIAGNOSIS — I252 Old myocardial infarction: Secondary | ICD-10-CM | POA: Diagnosis not present

## 2021-02-01 DIAGNOSIS — E8889 Other specified metabolic disorders: Secondary | ICD-10-CM | POA: Diagnosis present

## 2021-02-01 DIAGNOSIS — Z79899 Other long term (current) drug therapy: Secondary | ICD-10-CM

## 2021-02-01 DIAGNOSIS — Z681 Body mass index (BMI) 19 or less, adult: Secondary | ICD-10-CM | POA: Diagnosis not present

## 2021-02-01 DIAGNOSIS — L8990 Pressure ulcer of unspecified site, unspecified stage: Secondary | ICD-10-CM | POA: Insufficient documentation

## 2021-02-01 DIAGNOSIS — Z833 Family history of diabetes mellitus: Secondary | ICD-10-CM

## 2021-02-01 DIAGNOSIS — Z992 Dependence on renal dialysis: Secondary | ICD-10-CM

## 2021-02-01 DIAGNOSIS — E1151 Type 2 diabetes mellitus with diabetic peripheral angiopathy without gangrene: Secondary | ICD-10-CM | POA: Diagnosis present

## 2021-02-01 DIAGNOSIS — I4891 Unspecified atrial fibrillation: Secondary | ICD-10-CM | POA: Diagnosis present

## 2021-02-01 DIAGNOSIS — Z7902 Long term (current) use of antithrombotics/antiplatelets: Secondary | ICD-10-CM

## 2021-02-01 DIAGNOSIS — J449 Chronic obstructive pulmonary disease, unspecified: Secondary | ICD-10-CM | POA: Diagnosis present

## 2021-02-01 DIAGNOSIS — M1990 Unspecified osteoarthritis, unspecified site: Secondary | ICD-10-CM | POA: Diagnosis present

## 2021-02-01 DIAGNOSIS — Z7189 Other specified counseling: Secondary | ICD-10-CM | POA: Diagnosis not present

## 2021-02-01 DIAGNOSIS — Z7982 Long term (current) use of aspirin: Secondary | ICD-10-CM

## 2021-02-01 DIAGNOSIS — I509 Heart failure, unspecified: Secondary | ICD-10-CM

## 2021-02-01 DIAGNOSIS — I69398 Other sequelae of cerebral infarction: Secondary | ICD-10-CM

## 2021-02-01 DIAGNOSIS — I9581 Postprocedural hypotension: Secondary | ICD-10-CM

## 2021-02-01 DIAGNOSIS — H543 Unqualified visual loss, both eyes: Secondary | ICD-10-CM | POA: Diagnosis present

## 2021-02-01 HISTORY — PX: AXILLARY-FEMORAL BYPASS GRAFT: SHX894

## 2021-02-01 HISTORY — PX: VEIN HARVEST: SHX6363

## 2021-02-01 HISTORY — PX: PATCH ANGIOPLASTY: SHX6230

## 2021-02-01 HISTORY — PX: FEMORAL-FEMORAL BYPASS GRAFT: SHX936

## 2021-02-01 LAB — CBC
HCT: 34.5 % — ABNORMAL LOW (ref 39.0–52.0)
HCT: 27.6 % — ABNORMAL LOW (ref 39.0–52.0)
Hemoglobin: 10.9 g/dL — ABNORMAL LOW (ref 13.0–17.0)
Hemoglobin: 9.2 g/dL — ABNORMAL LOW (ref 13.0–17.0)
MCH: 29.3 pg (ref 26.0–34.0)
MCH: 29.4 pg (ref 26.0–34.0)
MCHC: 31.6 g/dL (ref 30.0–36.0)
MCHC: 33.3 g/dL (ref 30.0–36.0)
MCV: 92.7 fL (ref 80.0–100.0)
MCV: 88.2 fL (ref 80.0–100.0)
Platelets: 150 10*3/uL (ref 150–400)
Platelets: 166 10*3/uL (ref 150–400)
RBC: 3.72 MIL/uL — ABNORMAL LOW (ref 4.22–5.81)
RBC: 3.13 MIL/uL — ABNORMAL LOW (ref 4.22–5.81)
RDW: 13.7 % (ref 11.5–15.5)
RDW: 14 % (ref 11.5–15.5)
WBC: 4.6 10*3/uL (ref 4.0–10.5)
WBC: 8.6 10*3/uL (ref 4.0–10.5)
nRBC: 0 % (ref 0.0–0.2)
nRBC: 0 % (ref 0.0–0.2)

## 2021-02-01 LAB — COMPREHENSIVE METABOLIC PANEL
ALT: 15 U/L (ref 0–44)
AST: 22 U/L (ref 15–41)
Albumin: 2.6 g/dL — ABNORMAL LOW (ref 3.5–5.0)
Alkaline Phosphatase: 110 U/L (ref 38–126)
Anion gap: 11 (ref 5–15)
BUN: 26 mg/dL — ABNORMAL HIGH (ref 8–23)
CO2: 28 mmol/L (ref 22–32)
Calcium: 8.8 mg/dL — ABNORMAL LOW (ref 8.9–10.3)
Chloride: 97 mmol/L — ABNORMAL LOW (ref 98–111)
Creatinine, Ser: 5.35 mg/dL — ABNORMAL HIGH (ref 0.61–1.24)
GFR, Estimated: 10 mL/min — ABNORMAL LOW (ref >60)
Glucose, Bld: 71 mg/dL (ref 70–99)
Potassium: 5.1 mmol/L (ref 3.5–5.1)
Sodium: 136 mmol/L (ref 135–145)
Total Bilirubin: 0.9 mg/dL (ref 0.3–1.2)
Total Protein: 6.3 g/dL — ABNORMAL LOW (ref 6.5–8.1)

## 2021-02-01 LAB — CREATININE, SERUM
Creatinine, Ser: 5.68 mg/dL — ABNORMAL HIGH (ref 0.61–1.24)
GFR, Estimated: 10 mL/min — ABNORMAL LOW (ref >60)

## 2021-02-01 LAB — POCT I-STAT 7, (LYTES, BLD GAS, ICA,H+H)
Acid-Base Excess: 5 mmol/L — ABNORMAL HIGH (ref 0.0–2.0)
Bicarbonate: 30.1 mmol/L — ABNORMAL HIGH (ref 20.0–28.0)
Calcium, Ion: 1.3 mmol/L (ref 1.15–1.40)
HCT: 24 % — ABNORMAL LOW (ref 39.0–52.0)
Hemoglobin: 8.2 g/dL — ABNORMAL LOW (ref 13.0–17.0)
O2 Saturation: 100 %
Patient temperature: 35.4
Potassium: 4.7 mmol/L (ref 3.5–5.1)
Sodium: 138 mmol/L (ref 135–145)
TCO2: 31 mmol/L (ref 22–32)
pCO2 arterial: 42.2 mmHg (ref 32.0–48.0)
pH, Arterial: 7.454 — ABNORMAL HIGH (ref 7.350–7.450)
pO2, Arterial: 235 mmHg — ABNORMAL HIGH (ref 83.0–108.0)

## 2021-02-01 LAB — GLUCOSE, CAPILLARY
Glucose-Capillary: 79 mg/dL (ref 70–99)
Glucose-Capillary: 75 mg/dL (ref 70–99)
Glucose-Capillary: 71 mg/dL (ref 70–99)
Glucose-Capillary: 57 mg/dL — ABNORMAL LOW (ref 70–99)
Glucose-Capillary: 71 mg/dL (ref 70–99)
Glucose-Capillary: 155 mg/dL — ABNORMAL HIGH (ref 70–99)

## 2021-02-01 LAB — APTT: aPTT: 42 seconds — ABNORMAL HIGH (ref 24–36)

## 2021-02-01 LAB — SARS CORONAVIRUS 2 BY RT PCR (HOSPITAL ORDER, PERFORMED IN ~~LOC~~ HOSPITAL LAB): SARS Coronavirus 2: NEGATIVE

## 2021-02-01 LAB — PROTIME-INR
INR: 1.2 (ref 0.8–1.2)
Prothrombin Time: 14.7 seconds (ref 11.4–15.2)

## 2021-02-01 LAB — PREPARE RBC (CROSSMATCH)

## 2021-02-01 SURGERY — CREATION, BYPASS, ARTERIAL, AXILLARY TO BILATERAL FEMORAL, USING GRAFT
Anesthesia: General | Site: Leg Upper | Laterality: Right

## 2021-02-01 MED ORDER — PHENYLEPHRINE 40 MCG/ML (10ML) SYRINGE FOR IV PUSH (FOR BLOOD PRESSURE SUPPORT)
PREFILLED_SYRINGE | INTRAVENOUS | Status: DC | PRN
  Administered 2021-02-01 (×2): 80 ug via INTRAVENOUS

## 2021-02-01 MED ORDER — UMECLIDINIUM BROMIDE 62.5 MCG/INH IN AEPB
1.0000 | INHALATION_SPRAY | Freq: Every day | RESPIRATORY_TRACT | Status: DC
  Administered 2021-02-03 – 2021-02-04 (×2): 1 via RESPIRATORY_TRACT
  Filled 2021-02-01: qty 7

## 2021-02-01 MED ORDER — CLOPIDOGREL BISULFATE 75 MG PO TABS
75.0000 mg | ORAL_TABLET | Freq: Every day | ORAL | Status: DC
  Administered 2021-02-02: 75 mg via ORAL
  Filled 2021-02-01: qty 1

## 2021-02-01 MED ORDER — ASPIRIN EC 81 MG PO TBEC: 81.0000 mg | DELAYED_RELEASE_TABLET | Freq: Every day | ORAL | Status: DC

## 2021-02-01 MED ORDER — POLYETHYLENE GLYCOL 3350 17 G PO PACK: 17.0000 g | PACK | Freq: Every day | ORAL | Status: DC | PRN

## 2021-02-01 MED ORDER — LABETALOL HCL 5 MG/ML IV SOLN
5.0000 mg | Freq: Once | INTRAVENOUS | Status: AC
  Administered 2021-02-01: 5 mg via INTRAVENOUS

## 2021-02-01 MED ORDER — METOPROLOL TARTRATE 12.5 MG HALF TABLET
12.5000 mg | ORAL_TABLET | Freq: Two times a day (BID) | ORAL | Status: DC
  Administered 2021-02-01 – 2021-02-03 (×5): 12.5 mg via ORAL
  Filled 2021-02-01 (×5): qty 1

## 2021-02-01 MED ORDER — IPRATROPIUM-ALBUTEROL 0.5-2.5 (3) MG/3ML IN SOLN: 3.0000 mL | Freq: Four times a day (QID) | RESPIRATORY_TRACT | Status: DC | PRN

## 2021-02-01 MED ORDER — ACETAMINOPHEN 10 MG/ML IV SOLN: 650.0000 mg | Freq: Once | INTRAVENOUS | Status: DC | PRN

## 2021-02-01 MED ORDER — MORPHINE SULFATE (PF) 2 MG/ML IV SOLN
INTRAVENOUS | Status: AC
  Administered 2021-02-01: 2 mg via INTRAVENOUS
  Filled 2021-02-01: qty 1

## 2021-02-01 MED ORDER — PROPOFOL 10 MG/ML IV BOLUS
INTRAVENOUS | Status: DC | PRN
  Administered 2021-02-01 (×4): 50 mg via INTRAVENOUS

## 2021-02-01 MED ORDER — CHLORHEXIDINE GLUCONATE 0.12 % MT SOLN
OROMUCOSAL | Status: AC
  Administered 2021-02-01: 15 mL
  Filled 2021-02-01: qty 15

## 2021-02-01 MED ORDER — CALCIUM CHLORIDE 10 % IV SOLN
INTRAVENOUS | Status: AC
  Filled 2021-02-01: qty 10

## 2021-02-01 MED ORDER — NEOSTIGMINE METHYLSULFATE 10 MG/10ML IV SOLN
INTRAVENOUS | Status: DC | PRN
  Administered 2021-02-01: 1 mg via INTRAVENOUS

## 2021-02-01 MED ORDER — DEXTROSE 50 % IV SOLN
12.5000 g | Freq: Once | INTRAVENOUS | Status: AC
  Administered 2021-02-01: 12.5 g via INTRAVENOUS

## 2021-02-01 MED ORDER — HEPARIN SODIUM (PORCINE) 5000 UNIT/ML IJ SOLN
5000.0000 [IU] | Freq: Three times a day (TID) | INTRAMUSCULAR | Status: DC
  Administered 2021-02-01 – 2021-02-03 (×4): 5000 [IU] via SUBCUTANEOUS
  Filled 2021-02-01 (×4): qty 1

## 2021-02-01 MED ORDER — CEFAZOLIN SODIUM 1 G IJ SOLR
INTRAMUSCULAR | Status: AC
  Filled 2021-02-01: qty 20

## 2021-02-01 MED ORDER — CISATRACURIUM BESYLATE (PF) 10 MG/5ML IV SOLN
0.1000 mg/kg | INTRAVENOUS | Status: DC
  Filled 2021-02-01: qty 2.2

## 2021-02-01 MED ORDER — HYDRALAZINE HCL 20 MG/ML IJ SOLN
10.0000 mg | Freq: Once | INTRAMUSCULAR | Status: AC
  Administered 2021-02-01: 10 mg via INTRAVENOUS

## 2021-02-01 MED ORDER — SENNA 8.6 MG PO TABS: 1.0000 | ORAL_TABLET | Freq: Every day | ORAL | Status: DC

## 2021-02-01 MED ORDER — CHLORHEXIDINE GLUCONATE CLOTH 2 % EX PADS: 6.0000 | MEDICATED_PAD | Freq: Once | CUTANEOUS | Status: DC

## 2021-02-01 MED ORDER — SODIUM CHLORIDE 0.9 % IV SOLN
INTRAVENOUS | Status: DC | PRN
  Administered 2021-02-01: 12:00:00 500 mL

## 2021-02-01 MED ORDER — DEXTROSE 50 % IV SOLN
INTRAVENOUS | Status: AC
  Filled 2021-02-01: qty 50

## 2021-02-01 MED ORDER — LIDOCAINE 2% (20 MG/ML) 5 ML SYRINGE
INTRAMUSCULAR | Status: DC | PRN
  Administered 2021-02-01: 60 mg via INTRAVENOUS

## 2021-02-01 MED ORDER — TRAZODONE HCL 50 MG PO TABS
50.0000 mg | ORAL_TABLET | Freq: Every day | ORAL | Status: DC
  Administered 2021-02-01 – 2021-02-03 (×3): 50 mg via ORAL
  Filled 2021-02-01 (×3): qty 1

## 2021-02-01 MED ORDER — ONDANSETRON HCL 4 MG/2ML IJ SOLN: 4.0000 mg | Freq: Once | INTRAMUSCULAR | Status: DC | PRN

## 2021-02-01 MED ORDER — ONDANSETRON HCL 4 MG/2ML IJ SOLN
INTRAMUSCULAR | Status: DC | PRN
  Administered 2021-02-01: 4 mg via INTRAVENOUS

## 2021-02-01 MED ORDER — ONDANSETRON HCL 4 MG PO TABS: 4.0000 mg | ORAL_TABLET | Freq: Four times a day (QID) | ORAL | Status: DC | PRN

## 2021-02-01 MED ORDER — PROTAMINE SULFATE 10 MG/ML IV SOLN
INTRAVENOUS | Status: AC
  Filled 2021-02-01: qty 5

## 2021-02-01 MED ORDER — FENTANYL CITRATE (PF) 100 MCG/2ML IJ SOLN
INTRAMUSCULAR | Status: AC
  Filled 2021-02-01: qty 2

## 2021-02-01 MED ORDER — ATORVASTATIN CALCIUM 80 MG PO TABS
80.0000 mg | ORAL_TABLET | Freq: Every day | ORAL | Status: DC
  Administered 2021-02-01 – 2021-02-02 (×2): 80 mg via ORAL
  Filled 2021-02-01 (×2): qty 1

## 2021-02-01 MED ORDER — CALCIUM CHLORIDE 10 % IV SOLN
INTRAVENOUS | Status: DC | PRN
  Administered 2021-02-01: 500 mg via INTRAVENOUS

## 2021-02-01 MED ORDER — SODIUM CHLORIDE 0.9 % IV SOLN
1.0000 g | INTRAVENOUS | Status: DC
  Filled 2021-02-01: qty 1

## 2021-02-01 MED ORDER — HYDRALAZINE HCL 20 MG/ML IJ SOLN
INTRAMUSCULAR | Status: AC
  Filled 2021-02-01: qty 1

## 2021-02-01 MED ORDER — FENTANYL CITRATE (PF) 250 MCG/5ML IJ SOLN
INTRAMUSCULAR | Status: DC | PRN
  Administered 2021-02-01: 150 ug via INTRAVENOUS
  Administered 2021-02-01: 50 ug via INTRAVENOUS

## 2021-02-01 MED ORDER — LABETALOL HCL 5 MG/ML IV SOLN
INTRAVENOUS | Status: AC
  Filled 2021-02-01: qty 4

## 2021-02-01 MED ORDER — ALBUMIN HUMAN 5 % IV SOLN: INTRAVENOUS | Status: DC | PRN

## 2021-02-01 MED ORDER — SODIUM CHLORIDE 0.9 % IV SOLN: 250.0000 mL | INTRAVENOUS | Status: DC | PRN

## 2021-02-01 MED ORDER — ACETAMINOPHEN 650 MG RE SUPP: 650.0000 mg | Freq: Four times a day (QID) | RECTAL | Status: DC | PRN

## 2021-02-01 MED ORDER — FENTANYL CITRATE (PF) 100 MCG/2ML IJ SOLN
25.0000 ug | INTRAMUSCULAR | Status: DC | PRN
  Administered 2021-02-01 (×2): 25 ug via INTRAVENOUS

## 2021-02-01 MED ORDER — PROTAMINE SULFATE 10 MG/ML IV SOLN
INTRAVENOUS | Status: DC | PRN
  Administered 2021-02-01: 10 mg via INTRAVENOUS
  Administered 2021-02-01: 40 mg via INTRAVENOUS

## 2021-02-01 MED ORDER — 0.9 % SODIUM CHLORIDE (POUR BTL) OPTIME
TOPICAL | Status: DC | PRN
  Administered 2021-02-01: 2000 mL

## 2021-02-01 MED ORDER — RENA-VITE PO TABS
1.0000 | ORAL_TABLET | Freq: Every day | ORAL | Status: DC
  Administered 2021-02-01 – 2021-02-02 (×2): 1 via ORAL
  Filled 2021-02-01 (×2): qty 1

## 2021-02-01 MED ORDER — MORPHINE SULFATE (PF) 2 MG/ML IV SOLN
2.0000 mg | INTRAVENOUS | Status: DC | PRN
Start: 2021-02-01 — End: 2021-02-02
  Administered 2021-02-01 – 2021-02-02 (×5): 2 mg via INTRAVENOUS
  Filled 2021-02-01 (×3): qty 1

## 2021-02-01 MED ORDER — EPHEDRINE SULFATE-NACL 50-0.9 MG/10ML-% IV SOSY
PREFILLED_SYRINGE | INTRAVENOUS | Status: DC | PRN
  Administered 2021-02-01 (×2): 10 mg via INTRAVENOUS

## 2021-02-01 MED ORDER — DOCUSATE SODIUM 100 MG PO CAPS
200.0000 mg | ORAL_CAPSULE | Freq: Every day | ORAL | Status: DC
  Administered 2021-02-01 – 2021-02-02 (×2): 200 mg via ORAL
  Filled 2021-02-01 (×2): qty 2

## 2021-02-01 MED ORDER — ACETAMINOPHEN 325 MG PO TABS
650.0000 mg | ORAL_TABLET | Freq: Four times a day (QID) | ORAL | Status: DC | PRN
  Filled 2021-02-01: qty 2

## 2021-02-01 MED ORDER — ICOSAPENT ETHYL 1 G PO CAPS
1.0000 g | ORAL_CAPSULE | Freq: Two times a day (BID) | ORAL | Status: DC
  Administered 2021-02-03: 1 g via ORAL
  Filled 2021-02-01 (×5): qty 1

## 2021-02-01 MED ORDER — NEOSTIGMINE METHYLSULFATE 3 MG/3ML IV SOSY
PREFILLED_SYRINGE | INTRAVENOUS | Status: AC
  Filled 2021-02-01: qty 3

## 2021-02-01 MED ORDER — SODIUM CHLORIDE 0.9% FLUSH: 3.0000 mL | INTRAVENOUS | Status: DC | PRN

## 2021-02-01 MED ORDER — POLYETHYLENE GLYCOL 3350 17 GM/SCOOP PO POWD
17.0000 g | Freq: Every day | ORAL | Status: DC | PRN
  Filled 2021-02-01: qty 255

## 2021-02-01 MED ORDER — GABAPENTIN 100 MG PO CAPS
100.0000 mg | ORAL_CAPSULE | ORAL | Status: DC
  Administered 2021-02-02 – 2021-02-04 (×4): 100 mg via ORAL
  Filled 2021-02-01 (×4): qty 1

## 2021-02-01 MED ORDER — HYDRALAZINE HCL 20 MG/ML IJ SOLN
INTRAMUSCULAR | Status: AC
  Administered 2021-02-01: 10 mg via INTRAVENOUS
  Filled 2021-02-01: qty 1

## 2021-02-01 MED ORDER — GLYCOPYRROLATE 0.2 MG/ML IJ SOLN
INTRAMUSCULAR | Status: DC | PRN
  Administered 2021-02-01: .2 mg via INTRAVENOUS

## 2021-02-01 MED ORDER — SODIUM CHLORIDE 0.9% FLUSH
3.0000 mL | Freq: Two times a day (BID) | INTRAVENOUS | Status: DC
  Administered 2021-02-01 – 2021-02-05 (×8): 3 mL via INTRAVENOUS

## 2021-02-01 MED ORDER — GLYCOPYRROLATE PF 0.2 MG/ML IJ SOSY
PREFILLED_SYRINGE | INTRAMUSCULAR | Status: AC
  Filled 2021-02-01: qty 1

## 2021-02-01 MED ORDER — GABAPENTIN 300 MG PO CAPS
300.0000 mg | ORAL_CAPSULE | Freq: Every day | ORAL | Status: DC
  Administered 2021-02-01 – 2021-02-03 (×3): 300 mg via ORAL
  Filled 2021-02-01 (×3): qty 1

## 2021-02-01 MED ORDER — TIOTROPIUM BROMIDE MONOHYDRATE 18 MCG IN CAPS
18.0000 ug | ORAL_CAPSULE | Freq: Every day | RESPIRATORY_TRACT | Status: DC
  Filled 2021-02-01: qty 5

## 2021-02-01 MED ORDER — METOPROLOL TARTRATE 12.5 MG HALF TABLET
12.5000 mg | ORAL_TABLET | Freq: Once | ORAL | Status: AC
  Filled 2021-02-01: qty 1

## 2021-02-01 MED ORDER — HEPARIN SODIUM (PORCINE) 1000 UNIT/ML IJ SOLN
INTRAMUSCULAR | Status: DC | PRN
  Administered 2021-02-01: 5000 [IU] via INTRAVENOUS

## 2021-02-01 MED ORDER — METOPROLOL TARTRATE 12.5 MG HALF TABLET
ORAL_TABLET | ORAL | Status: AC
  Administered 2021-02-01: 12.5 mg via ORAL
  Filled 2021-02-01: qty 1

## 2021-02-01 MED ORDER — HYDRALAZINE HCL 20 MG/ML IJ SOLN
10.0000 mg | INTRAMUSCULAR | Status: DC | PRN
  Administered 2021-02-02 – 2021-02-04 (×2): 10 mg via INTRAVENOUS
  Filled 2021-02-01 (×2): qty 1

## 2021-02-01 MED ORDER — PHENYLEPHRINE HCL-NACL 10-0.9 MG/250ML-% IV SOLN
INTRAVENOUS | Status: DC | PRN
  Administered 2021-02-01: 100 ug/min via INTRAVENOUS

## 2021-02-01 MED ORDER — HEPARIN SODIUM (PORCINE) 1000 UNIT/ML IJ SOLN
INTRAMUSCULAR | Status: AC
  Filled 2021-02-01: qty 1

## 2021-02-01 MED ORDER — PROPOFOL 10 MG/ML IV BOLUS
INTRAVENOUS | Status: AC
  Filled 2021-02-01: qty 20

## 2021-02-01 MED ORDER — SODIUM CHLORIDE 0.9 % IV SOLN
INTRAVENOUS | Status: AC
  Filled 2021-02-01: qty 1.2

## 2021-02-01 MED ORDER — NITROGLYCERIN 0.4 MG SL SUBL: 0.4000 mg | SUBLINGUAL_TABLET | SUBLINGUAL | Status: DC | PRN

## 2021-02-01 MED ORDER — VANCOMYCIN HCL 500 MG/100ML IV SOLN
500.0000 mg | INTRAVENOUS | Status: DC
  Filled 2021-02-01: qty 100

## 2021-02-01 MED ORDER — CEFAZOLIN SODIUM-DEXTROSE 2-4 GM/100ML-% IV SOLN
2.0000 g | INTRAVENOUS | Status: AC
  Administered 2021-02-01: 2 g via INTRAVENOUS
  Filled 2021-02-01: qty 100

## 2021-02-01 MED ORDER — FENTANYL CITRATE (PF) 250 MCG/5ML IJ SOLN
INTRAMUSCULAR | Status: AC
  Filled 2021-02-01: qty 5

## 2021-02-01 MED ORDER — MIRTAZAPINE 15 MG PO TABS
7.5000 mg | ORAL_TABLET | Freq: Every day | ORAL | Status: DC
  Administered 2021-02-01 – 2021-02-03 (×3): 7.5 mg via ORAL
  Filled 2021-02-01 (×4): qty 1

## 2021-02-01 MED ORDER — SEVELAMER CARBONATE 800 MG PO TABS: 1600.0000 mg | ORAL_TABLET | Freq: Three times a day (TID) | ORAL | Status: DC

## 2021-02-01 MED ORDER — VANCOMYCIN HCL 1000 MG/200ML IV SOLN
1000.0000 mg | INTRAVENOUS | Status: DC
  Filled 2021-02-01: qty 200

## 2021-02-01 MED ORDER — SODIUM CHLORIDE 0.9 % IV SOLN: INTRAVENOUS | Status: DC

## 2021-02-01 MED ORDER — CISATRACURIUM BESYLATE (PF) 10 MG/5ML IV SOLN
INTRAVENOUS | Status: DC | PRN
  Administered 2021-02-01: 12 mg via INTRAVENOUS

## 2021-02-01 MED ORDER — ISOSORBIDE MONONITRATE ER 30 MG PO TB24
30.0000 mg | ORAL_TABLET | Freq: Every day | ORAL | Status: DC
  Administered 2021-02-02 – 2021-02-03 (×2): 30 mg via ORAL
  Filled 2021-02-01 (×2): qty 1

## 2021-02-01 SURGICAL SUPPLY — 45 items
CANISTER SUCT 3000ML PPV (MISCELLANEOUS) ×5 IMPLANT
CANNULA VESSEL 3MM 2 BLNT TIP (CANNULA) ×10 IMPLANT
CATH EMB 4FR 40CM (CATHETERS) ×5 IMPLANT
CLIP LIGATING EXTRA MED SLVR (CLIP) ×10 IMPLANT
CLIP LIGATING EXTRA SM BLUE (MISCELLANEOUS) ×10 IMPLANT
COVER WAND RF STERILE (DRAPES) ×5 IMPLANT
DERMABOND ADVANCED (GAUZE/BANDAGES/DRESSINGS) ×1
DERMABOND ADVANCED .7 DNX12 (GAUZE/BANDAGES/DRESSINGS) ×4 IMPLANT
DRAIN SNY 10X20 3/4 PERF (WOUND CARE) IMPLANT
DRAPE INCISE IOBAN 66X45 STRL (DRAPES) ×5 IMPLANT
DRAPE X-RAY CASS 24X20 (DRAPES) IMPLANT
ELECT REM PT RETURN 9FT ADLT (ELECTROSURGICAL) ×5
ELECTRODE REM PT RTRN 9FT ADLT (ELECTROSURGICAL) ×4 IMPLANT
EVACUATOR SILICONE 100CC (DRAIN) IMPLANT
GAUZE SPONGE 4X4 12PLY STRL (GAUZE/BANDAGES/DRESSINGS) ×5 IMPLANT
GLOVE SS BIOGEL STRL SZ 7.5 (GLOVE) ×4 IMPLANT
GLOVE SUPERSENSE BIOGEL SZ 7.5 (GLOVE) ×1
GOWN STRL REUS W/ TWL LRG LVL3 (GOWN DISPOSABLE) ×12 IMPLANT
GOWN STRL REUS W/TWL LRG LVL3 (GOWN DISPOSABLE) ×15
INSERT FOGARTY SM (MISCELLANEOUS) IMPLANT
KIT BASIN OR (CUSTOM PROCEDURE TRAY) ×5 IMPLANT
KIT TURNOVER KIT B (KITS) ×5 IMPLANT
NS IRRIG 1000ML POUR BTL (IV SOLUTION) ×10 IMPLANT
PACK PERIPHERAL VASCULAR (CUSTOM PROCEDURE TRAY) ×5 IMPLANT
PAD ARMBOARD 7.5X6 YLW CONV (MISCELLANEOUS) ×10 IMPLANT
SET COLLECT BLD 21X3/4 12 (NEEDLE) IMPLANT
SPONGE LAP 18X18 RF (DISPOSABLE) ×10 IMPLANT
STAPLER VISISTAT 35W (STAPLE) IMPLANT
STOPCOCK 4 WAY LG BORE MALE ST (IV SETS) ×5 IMPLANT
SUT PROLENE 5 0 C 1 24 (SUTURE) ×20 IMPLANT
SUT PROLENE 5 0 C 1 36 (SUTURE) ×5 IMPLANT
SUT PROLENE 5 0 CC 1 (SUTURE) ×5 IMPLANT
SUT PROLENE 6 0 CC (SUTURE) ×70 IMPLANT
SUT SILK 2 0 PERMA HAND 18 BK (SUTURE) IMPLANT
SUT SILK 2 0 SH (SUTURE) IMPLANT
SUT VIC AB 2-0 CTX 36 (SUTURE) ×10 IMPLANT
SUT VIC AB 3-0 SH 27 (SUTURE) ×10
SUT VIC AB 3-0 SH 27X BRD (SUTURE) ×8 IMPLANT
SWAB COLLECTION DEVICE MRSA (MISCELLANEOUS) ×5 IMPLANT
SWAB CULTURE ESWAB REG 1ML (MISCELLANEOUS) ×5 IMPLANT
TOWEL GREEN STERILE (TOWEL DISPOSABLE) ×5 IMPLANT
TRAY FOLEY MTR SLVR 16FR STAT (SET/KITS/TRAYS/PACK) ×5 IMPLANT
TUBE CONNECTING 12X1/4 (SUCTIONS) ×5 IMPLANT
TUBING EXTENTION W/L.L. (IV SETS) IMPLANT
WATER STERILE IRR 1000ML POUR (IV SOLUTION) ×5 IMPLANT

## 2021-02-01 NOTE — Progress Notes (Signed)
Patient ID: Trevor [name], male   DOB: Dec 15, 1945, 75 y.o.   MRN: [id number] Confused but comfortable in PACU H&H 8 and 24.  Will transfuse 1 unit packed red blood cells. Right foot with motor function.  Difficult to assess full level of ischemia.  Discussed with patient preop.  Extremely high risk for amputation.  Was able to stand for several seconds to help transfer but was mostly left bed to chair according to the patient.  Appreciate hospitalist admission and care

## 2021-02-01 NOTE — Progress Notes (Signed)
Was asked to see Trevor [name]-  Is ESRD patient who just underwent a vascular surgery-  Removal of infected axillary bifem graft.  There was worry of volume overload.  When I see pt he is somnolent but arousable-  BP is high-  Just over [id number] systolic and he is asking for pain med.  He has received blood already-  Is lying flat on [place] with no inc work of breathing and O2 sat of 100, k is 4.7. Today is regular HD day.  However, I see no needs to dialyze him this evening with only one nurse in house and other more emergent treatments.  Plan will be to dialyze him tomorrow off schedule   Full consult note to follow   Louis Meckel

## 2021-02-01 NOTE — Interval H&P Note (Signed)
History and Physical Interval Note:  02/01/2021 10:12 AM  Trevor [name]  has presented today for surgery, with the diagnosis of RIGHT GROIN INFECTION WITH MULTIPLE COMORBIDITIES.  The various methods of treatment have been discussed with the patient and family. After consideration of risks, benefits and other options for treatment, the patient has consented to  Procedure(s): REMOVAL OF AXILLARY-BIFEMORAL BYPASS GRAFT (Bilateral) as a surgical intervention.  The patient's history has been reviewed, patient examined, no change in status, stable for surgery.  I have reviewed the patient's chart and labs.  Questions were answered to the patient's satisfaction.     Curt Jews

## 2021-02-01 NOTE — Progress Notes (Signed)
Called to admit patient to hospitalist service. When we went to see patient he was on a Neo-Synephrine drip for hypotension with concern for significant blood loss intraoperatively.  Patient has end-stage renal disease and there is also some concern for possible fluid overload with crystalloids given intraoperatively.  Discussed patient with anesthesiologist and suggested that nephrology consultation might be helpful if transfusion is warranted.  Blood transfusion might be able to be given during dialysis if he can tolerate it given hypotension.  Would also need hemodialysis to manage any fluid overload as it occurs.  At this point patient is not appropriate to be admitted to the medical floor under the hospitalist service.  If patient is able to be weaned off of Neo-Synephrine and if he is able to tolerate transfusion without fluid overload, please reconsult hospital service for repeat evaluation.  Discussed with anesthesiologist and secure chat message sent to Dr. Donnetta Hutching and PA Cameron Proud.

## 2021-02-01 NOTE — Op Note (Signed)
    NAME: Trevor [name]    MRN: [id number] DOB: November 13, 1946    DATE OF OPERATION: 02/01/2021  PREOP DIAGNOSIS:    Infected right axillobifemoral bypass graft  POSTOP DIAGNOSIS:    Same  PROCEDURE:    Redo exposure left common femoral artery Removal of left limb of femoral-femoral bypass graft Vein patch angioplasty of the left common femoral artery using right great saphenous vein  SURGEON: Curt Jews, MD  CO-SURGEONJudeth Cornfield. Scot Dock, MD  ASSIST: Arlee Muslim, MD  ANESTHESIA: General  EBL: Per anesthesia records.  INDICATIONS:    Trevor [name] is a 75 y.o. male who presented with an infected right axillobifemoral bypass graft.  FINDINGS:   The left groin was well incorporated.    TECHNIQUE:   Given the complexity of the case, and the patients comorbidities, a two team approach was used in order to expedite the procedure.  The patient had been prepped and draped in the usual sterile fashion.  A longitudinal incision was made in the left groin where the previous incision had been made.  The the incision was extended distally.  Through dense scar tissue I exposed the left limb of the femorofemoral graft, the common femoral artery, the deep femoral artery, and the superficial femoral artery.  Vein had been harvested from the right thigh to be used as a vein patch.  I clamped the left limb of the femorofemoral graft and divided the graft to allow adequate exposure of the common femoral artery above the anastomosis.  Patient had been heparinized.  The common femoral, deep femoral, and superficial femoral arteries were controlled.  The graft was removed in its entirety from the femoral artery.  The artery was trimmed back to healthy appearing artery.  The vein patch was opened longitudinally.  Proximal control was obtained with a 4 Fogarty catheter to allow better exposure of the proximal patch.  The patch was sewn with running 5-0 Prolene suture.  Prior to completing  anastomosis the artery was backbled and flushed appropriately.  There was some inflow from the external iliac artery on the left.  There was good backbleeding from the deep femoral artery.  There was reasonable flow through the external iliac artery.  There was minimal flow from the superficial femoral artery.  The anastomosis was completed.  The vein was quite fragile and there were multiple areas or bleeding.  When I would try to repair these with the 6-0 prolene the vein would bleed more therefore I used multiple pledgeted 6-0 sutures using pledgets made from the saphenous vein.  Ultimately good hemostasis was obtained.  His heparin was reversed with protamine and at that point the anastomosis was hemostatic.  Hemostasis was obtained in the wound.  The wound was closed with a deep layer of 2-0 Vicryl.  The subcutaneous layer was closed with 3-0 Vicryl and the skin closed with 4-0 Vicryl.  Dermabond was applied.   Given the complexity of the case a first assistant was necessary in order to expedient the procedure and safely perform the technical aspects of the operation.  [name], MD, FACS [facility] [place] DICTATION:   02/01/2021

## 2021-02-01 NOTE — Progress Notes (Signed)
Called [facility] and reviewed medications and last doses with RN.

## 2021-02-01 NOTE — Consult Note (Signed)
Reason for Consult: To manage dialysis and dialysis related needs Referring Physician: [name] is an 75 y.o. male with past medical history significant for DM, HTN, CAD with CHF, AAA, PAD with interventions and left AKA as well as ESRD-  MWF compliant [place] from [place]  He had developed drainage from the groin, found to have an infected axillary bifem vascular graft, underwent removal of that graft today per vascular.  Post op was transiently hypotensive-  Resolved quickly , then was hypertensive.  Also was transfused postoperatively.  We are asked to provide dialysis during his hospital stay.     Dialyzes at [place]  MWF 4 hours- compliant EDW 43.but leaving above profile 4  HD Bath 2/2, Dialyzer 160, Heparin no. Access AVF. hect 2, sensipar 60, mircera 100  Last hgb 10.5, k 4, calc 8.9, phos 3.1, pth 163  Past Medical History:  Diagnosis Date  . AAA (abdominal aortic aneurysm) ([place])   . Anemia   . Anxiety   . Arthritis    arms and back  . Blindness   . CHF (congestive heart failure) ([place])   . Coronary artery disease    mild   . 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 Diabetes mellitus without complication ([place])   . ESRD on hemodialysis ([place])    started HD 2014-15  . Headache(784.0)   . HTN (hypertension)   . Protein-calorie malnutrition ([place])   . STEMI (ST elevation myocardial infarction) ([place]) 11/11/2018  . Stroke [facility]) 01/2018   no residual deficits    Past Surgical History:  Procedure Laterality Date  . A/V FISTULAGRAM N/A 07/29/2018   Procedure: A/V FISTULAGRAM - left upper extremity;  Surgeon: Waynetta Sandy, MD;  Location: [place] CV LAB;  Service: Cardiovascular;  Laterality: N/A;  . AMPUTATION Left 03/01/2019   Procedure: AMPUTATION ABOVE KNEE;  Surgeon: Angelia Mould, MD;  Location: [place];  Service: Vascular;  Laterality: Left;  . AXILLARY-FEMORAL BYPASS GRAFT  06/28/2017    Procedure: BYPASS GRAFT RIGHT AXILLA-BIFEMORAL USING 8MM X 30CM AND 8MM X 60CM HEMASHIELD GOLD GRAFTS;  Surgeon: Rosetta Posner, MD;  Location: [place];  Service: Vascular;;  . CARDIAC CATHETERIZATION N/A 11/01/2016   Procedure: Left Heart Cath and Coronary Angiography;  Surgeon: Lorretta Harp, MD;  Location: [place] CV LAB;  Service: Cardiovascular;  Laterality: N/A;  . ENDARTERECTOMY FEMORAL Bilateral 06/28/2017   Procedure: ENDARTERECTOMY BILATERAL FEMORAL ARTERIES;  Surgeon: Rosetta Posner, MD;  Location: [place];  Service: Vascular;  Laterality: Bilateral;  . FISTULA SUPERFICIALIZATION Left [id number]   Procedure: PLICATION OF ARTERIOVENOUS FISTULA LEFT ARM;  Surgeon: Waynetta Sandy, MD;  Location: [place];  Service: Vascular;  Laterality: Left;  . FISTULOGRAM Left 05/20/2018   Procedure: FISTULOGRAM WITH BALLOON ANGIOPLASTY LEFT ARM ARTERIOVENOUS FISTULA;  Surgeon: Waynetta Sandy, MD;  Location: [place];  Service: Vascular;  Laterality: Left;  . INSERTION OF DIALYSIS CATHETER  09/21/2012   Procedure: INSERTION OF DIALYSIS CATHETER;  Surgeon: Angelia Mould, MD;  Location: [place];  Service: Vascular;  Laterality: N/A;  Right Internal Jugular Placement  . LEFT HEART CATH N/A 11/11/2018   Procedure: LEFT HEART CATH;  Surgeon: Sherren Mocha, MD;  Location: [place] CV LAB;  Service: Cardiovascular;  Laterality: N/A;  . LEFT HEART CATHETERIZATION WITH CORONARY ANGIOGRAM N/A 09/28/2012   Procedure: LEFT HEART CATHETERIZATION WITH CORONARY ANGIOGRAM;  Surgeon: Legrand Como  Burt Knack, MD;  Location: [facility] CATH LAB;  Service: Cardiovascular;  Laterality: N/A;  . LEFT HEART CATHETERIZATION WITH CORONARY ANGIOGRAM N/A 07/07/2014   Procedure: LEFT HEART CATHETERIZATION WITH CORONARY ANGIOGRAM;  Surgeon: Leonie Man, MD;  Location: [facility] CATH LAB;  Service: Cardiovascular;  Laterality: N/A;  . PERIPHERAL VASCULAR BALLOON ANGIOPLASTY Left 07/29/2018   Procedure: PERIPHERAL VASCULAR BALLOON  ANGIOPLASTY;  Surgeon: Waynetta Sandy, MD;  Location: [place] CV LAB;  Service: Cardiovascular;  Laterality: Left;  CENTRAL VEIN  . PERIPHERAL VASCULAR INTERVENTION Left 07/29/2018   Procedure: PERIPHERAL VASCULAR INTERVENTION;  Surgeon: Waynetta Sandy, MD;  Location: [place] CV LAB;  Service: Cardiovascular;  Laterality: Left;  AXILLARY VEIN  . REVISION OF ARTERIOVENOUS GORETEX GRAFT Left 12/30/2018   Procedure: REVISION OF ARTERIOVENOUS FISTULA LEFT ARM;  Surgeon: Rosetta Posner, MD;  Location: [place];  Service: Vascular;  Laterality: Left;  . REVISON OF ARTERIOVENOUS FISTULA Left 08/17/2018   Procedure: REVISION OF ARTERIOVENOUS FISTULA LEFT ARM;  Surgeon: Waynetta Sandy, MD;  Location: [name];  Service: Vascular;  Laterality: Left;  . right hand    . TEE WITHOUT CARDIOVERSION  10/09/2011   Procedure: TRANSESOPHAGEAL ECHOCARDIOGRAM (TEE);  Surgeon: Lelon Perla, MD;  Location: [facility] ENDOSCOPY;  Service: Cardiovascular;  Laterality: N/A;    Family History  Problem Relation Age of Onset  . Heart attack Mother        MI in her 3s  . Diabetes Mother   . Alcohol abuse Father   . Anesthesia problems Neg Hx   . Hypotension Neg Hx   . Malignant hyperthermia Neg Hx   . Pseudochol deficiency Neg Hx     Social History:  reports that he has been smoking cigarettes. He has a 30.00 pack-year smoking history. He has never used smokeless tobacco. He reports that he does not drink alcohol and does not use drugs.  Allergies: No Known Allergies  Medications: I have reviewed the patient's current medications.   Results for orders placed or performed during the hospital encounter of 02/01/21 (from the past 48 hour(s))  SARS Coronavirus 2 by RT PCR (hospital order, performed in [facility] hospital lab) Nasopharyngeal Nasopharyngeal Swab     Status: None   Collection Time: 02/01/21  5:54 AM   Specimen: Nasopharyngeal Swab  Result Value Ref Range   SARS Coronavirus  2 NEGATIVE NEGATIVE    Comment: (NOTE) SARS-CoV-2 target nucleic acids are NOT DETECTED.  The SARS-CoV-2 RNA is generally detectable in upper and lower respiratory specimens during the acute phase of infection. The lowest concentration of SARS-CoV-2 viral copies this assay can detect is 250 copies / mL. A negative result does not preclude SARS-CoV-2 infection and should not be used as the sole basis for treatment or other patient management decisions.  A negative result may occur with improper specimen collection / handling, submission of specimen other than nasopharyngeal swab, presence of viral mutation(s) within the areas targeted by this assay, and inadequate number of viral copies (<250 copies / mL). A negative result must be combined with clinical observations, patient history, and epidemiological information.  Fact Sheet for Patients:   StrictlyIdeas.no  Fact Sheet for Healthcare Providers: BankingDealers.co.za  This test is not yet approved or  cleared by the [place] FDA and has been authorized for detection and/or diagnosis of SARS-CoV-2 by FDA under an Emergency Use Authorization (EUA).  This EUA will remain in effect (meaning this test can be used) for the duration of the  COVID-19 declaration under Section 564(b)(1) of the Act, 21 U.S.C. section 360bbb-3(b)(1), unless the authorization is terminated or revoked sooner.  Performed at [facility], [place] [address] [place], [place] [address]   Glucose, capillary     Status: None   Collection Time: 02/01/21  6:23 AM  Result Value Ref Range   Glucose-Capillary 79 70 - 99 mg/dL    Comment: Glucose reference range applies only to samples taken after fasting for at least 8 hours.  Glucose, capillary     Status: None   Collection Time: 02/01/21  7:24 AM  Result Value Ref Range   Glucose-Capillary 75 70 - 99 mg/dL    Comment: Glucose reference range applies only to  samples taken after fasting for at least 8 hours.  CBC     Status: Abnormal   Collection Time: 02/01/21  7:30 AM  Result Value Ref Range   WBC 4.6 4.0 - 10.5 K/uL   RBC 3.72 (L) 4.22 - 5.81 MIL/uL   Hemoglobin 10.9 (L) 13.0 - 17.0 g/dL   HCT 34.5 (L) 39.0 - 52.0 %   MCV 92.7 80.0 - 100.0 fL   MCH 29.3 26.0 - 34.0 pg   MCHC 31.6 30.0 - 36.0 g/dL   RDW 13.7 11.5 - 15.5 %   Platelets 150 150 - 400 K/uL   nRBC 0.0 0.0 - 0.2 %    Comment: Performed at [facility], [place] [address] [place], [place] [address]  Comprehensive metabolic panel     Status: Abnormal   Collection Time: 02/01/21  7:30 AM  Result Value Ref Range   Sodium 136 135 - 145 mmol/L   Potassium 5.1 3.5 - 5.1 mmol/L    Comment: HEMOLYSIS AT THIS LEVEL MAY AFFECT RESULT   Chloride 97 (L) 98 - 111 mmol/L   CO2 28 22 - 32 mmol/L   Glucose, Bld 71 70 - 99 mg/dL    Comment: Glucose reference range applies only to samples taken after fasting for at least 8 hours.   BUN 26 (H) 8 - 23 mg/dL   Creatinine, Ser 5.35 (H) 0.61 - 1.24 mg/dL   Calcium 8.8 (L) 8.9 - 10.3 mg/dL   Total Protein 6.3 (L) 6.5 - 8.1 g/dL   Albumin 2.6 (L) 3.5 - 5.0 g/dL   AST 22 15 - 41 U/L   ALT 15 0 - 44 U/L   Alkaline Phosphatase 110 38 - 126 U/L   Total Bilirubin 0.9 0.3 - 1.2 mg/dL   GFR, Estimated 10 (L) >60 mL/min    Comment: (NOTE) Calculated using the CKD-EPI Creatinine Equation (2021)    Anion gap 11 5 - 15    Comment: Performed at [facility], [place] [address], [place], [place] [address]  Protime-INR     Status: None   Collection Time: 02/01/21  7:30 AM  Result Value Ref Range   Prothrombin Time 14.7 11.4 - 15.2 seconds   INR 1.2 0.8 - 1.2    Comment: (NOTE) INR goal varies based on device and disease states. Performed at [facility], [place] [address], [place], [place] [address]   APTT     Status: Abnormal   Collection Time: 02/01/21  7:30 AM  Result Value Ref Range   aPTT 42 (H) 24 - 36 seconds    Comment:         IF BASELINE aPTT IS ELEVATED, SUGGEST PATIENT RISK ASSESSMENT BE USED TO DETERMINE APPROPRIATE ANTICOAGULANT THERAPY.  Performed at [facility], [place] [address] [place], [place] [address]   Type and screen     Status: None (Preliminary result)   Collection Time: 02/01/21  7:38 AM  Result Value Ref Range   ABO/RH(D) B POS    Antibody Screen NEG    Sample Expiration 02/04/2021,2359    Unit Number [id number]    Blood Component Type RED CELLS,LR    Unit division 00    Status of Unit ALLOCATED    Transfusion Status OK TO TRANSFUSE    Crossmatch Result      Compatible Performed at [facility], [place] [address] [place], [place] [address]    Unit Number [id number]    Blood Component Type RED CELLS,LR    Unit division 00    Status of Unit ISSUED    Transfusion Status OK TO TRANSFUSE    Crossmatch Result Compatible   Glucose, capillary     Status: None   Collection Time: 02/01/21  9:33 AM  Result Value Ref Range   Glucose-Capillary 71 70 - 99 mg/dL    Comment: Glucose reference range applies only to samples taken after fasting for at least 8 hours.  Aerobic/Anaerobic Culture w Gram Stain (surgical/deep wound)     Status: None (Preliminary result)   Collection Time: 02/01/21 11:30 AM   Specimen: Groin, Right; Wound  Result Value Ref Range   Specimen Description WOUND    Special Requests RIGHT GROIN WOUND SPEC A    Gram Stain      NO WBC SEEN NO ORGANISMS SEEN Performed at [facility], [address] [place], [place] [address]    Culture PENDING    Report Status PENDING   Prepare RBC (crossmatch)     Status: None   Collection Time: 02/01/21  1:30 PM  Result Value Ref Range   Order Confirmation      ORDER PROCESSED BY BLOOD BANK Performed at [facility], [place] [address], [place], [place] [address]   I-STAT 7, (LYTES, BLD GAS, ICA, H+H)     Status: Abnormal   Collection Time: 02/01/21  1:53 PM  Result Value Ref Range   pH, Arterial 7.454 (H)  7.350 - 7.450   pCO2 arterial 42.2 32.0 - 48.0 mmHg   pO2, Arterial 235 (H) 83.0 - 108.0 mmHg   Bicarbonate 30.1 (H) 20.0 - 28.0 mmol/L   TCO2 31 22 - 32 mmol/L   O2 Saturation 100.0 %   Acid-Base Excess 5.0 (H) 0.0 - 2.0 mmol/L   Sodium 138 135 - 145 mmol/L   Potassium 4.7 3.5 - 5.1 mmol/L   Calcium, Ion 1.30 1.15 - 1.40 mmol/L   HCT 24.0 (L) 39.0 - 52.0 %   Hemoglobin 8.2 (L) 13.0 - 17.0 g/dL   Patient temperature 35.4 C    Sample type ARTERIAL   Glucose, capillary     Status: Abnormal   Collection Time: 02/01/21  4:08 PM  Result Value Ref Range   Glucose-Capillary 57 (L) 70 - 99 mg/dL    Comment: Glucose reference range applies only to samples taken after fasting for at least 8 hours.  Glucose, capillary     Status: None   Collection Time: 02/01/21  5:23 PM  Result Value Ref Range   Glucose-Capillary 71 70 - 99 mg/dL    Comment: Glucose reference range applies only to samples taken after fasting for at least 8 hours.  Glucose, capillary     Status: Abnormal  Collection Time: 02/01/21  6:38 PM  Result Value Ref Range   Glucose-Capillary 155 (H) 70 - 99 mg/dL    Comment: Glucose reference range applies only to samples taken after fasting for at least 8 hours.  CBC     Status: Abnormal   Collection Time: 02/01/21  9:54 PM  Result Value Ref Range   WBC 8.6 4.0 - 10.5 K/uL   RBC 3.13 (L) 4.22 - 5.81 MIL/uL   Hemoglobin 9.2 (L) 13.0 - 17.0 g/dL   HCT 27.6 (L) 39.0 - 52.0 %   MCV 88.2 80.0 - 100.0 fL   MCH 29.4 26.0 - 34.0 pg   MCHC 33.3 30.0 - 36.0 g/dL   RDW 14.0 11.5 - 15.5 %   Platelets 166 150 - 400 K/uL   nRBC 0.0 0.0 - 0.2 %    Comment: Performed at [facility], [place] [address], [place], [place] [address]  Creatinine, serum     Status: Abnormal   Collection Time: 02/01/21  9:54 PM  Result Value Ref Range   Creatinine, Ser 5.68 (H) 0.61 - 1.24 mg/dL   GFR, Estimated 10 (L) >60 mL/min    Comment: (NOTE) Calculated using the CKD-EPI Creatinine Equation  (2021) Performed at [place] [address] [place], [place] [address]     DG CHEST PORT 1 VIEW  Result Date: 02/01/2021 CLINICAL DATA:  Shortness of breath EXAM: PORTABLE CHEST 1 VIEW COMPARISON:  07/09/2020 FINDINGS: Left upper extremity vascular stent. No focal opacity or pleural effusion. Stable cardiomediastinal silhouette with aortic atherosclerosis. No pneumothorax. IMPRESSION: No active disease. Electronically Signed   By: Donavan Foil M.D.   On: 02/01/2021 19:40    ROS: not really able to obtain postop-  Some postoperative pain  Blood pressure (!) 148/62, pulse 82, temperature (!) 97.2 F (36.2 C), temperature source Oral, resp. rate 19, height '5\' 5"'$  (1.651 m), weight 45.6 kg, SpO2 100 %. General appearance: cachectic, fatigued and mild distress Resp: clear to auscultation bilaterally Cardio: regular rate and rhythm GI: soft, non-tender; bowel sounds normal; no masses,  no organomegaly Extremities: left AKA-  no edema-  post op  left upper arm AVF- patent  Assessment/Plan: [name] with multiple medical issues including ESRD-  Now admitted for axillary bifem vascular graft removal due to infection  1 infected vascular graft-  S/p removal per VVS , on cefepime and vanc  2 ESRD: normally MWF via AVF.  Post op was late and there were no acute indications for night dialysis-  Will plan for tomorrow off schedule 3 Hypertension: was both hypo and hypertensive post op-  Using PRNs for high BP-  UF as able with HD 4. Anemia of ESRD: required transfusion post op-  Latest hgb over 9-  Continue ESA 5. Metabolic Bone Disease: will plan to continue hectorol and sensipar- also on renvela as OP- will resume when eating more regularly    Louis Meckel 02/01/2021, 11:44 PM

## 2021-02-01 NOTE — Progress Notes (Addendum)
Received pt from PACU to 4E05. VSS. CHG and foley care done. Tele applied and verified x2. All incisions CDI. Dopplerable femoral pulses bilaterally. No pulses in R foot per PACU report.  Pain in RLE 8/10 and PRNs given. Sister [name] called and given upadte on pt w/ pt permission. Pt oriented to room and call light. Will continue to monitor.  Jaymes Graff, RN

## 2021-02-01 NOTE — Progress Notes (Signed)
Pharmacy Antibiotic Note  Trevor [name] is a 75 y.o. 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  Pharmacy has been consulted for Cefepime and Vanco dosing.  Significant events: 3/4: Removal of infected axillary bifem graft. 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 Afebrile. WBC WNL. Dose for ESRD.  Vanco 3/4>> Cefepime 3/4>> 3/4: Ancef x 1 with surgery  Plan:  HD 3/5 off-schedule. Send Cefepime 1g and Vanco 1g to PACU now-f/u charting Cefepime 1g IV q24h Vanco '500mg'$  IV qHD MWF + tomorrow off-schedule   Height: '5\' 5"'$  (165.1 cm) Weight: 44.9 kg (99 lb) IBW/kg (Calculated) : 61.5  Temp (24hrs), Avg:97.5 F (36.4 C), Min:97.2 F (36.2 C), Max:98.1 F (36.7 C)  Recent Labs  Lab 02/01/21 0730  WBC 4.6  CREATININE 5.35*    Estimated Creatinine Clearance: 7.6 mL/min (A) (by C-G formula based on SCr of 5.35 mg/dL (H)).    No Known Allergies  Dempsey Knotek S. Alford Highland, PharmD, BCPS Clinical Staff Pharmacist [email or web address]  Wayland Salinas 02/01/2021 7:44 PM

## 2021-02-01 NOTE — Anesthesia Procedure Notes (Addendum)
Arterial Line Insertion Start/End3/03/2021 10:00 AM Performed by: Murvin Natal, MD, Reece Agar, CRNA, CRNA  Patient location: Pre-op. Lidocaine 1% used for infiltration and patient sedated Right, radial was placed Catheter size: 20 G Hand hygiene performed , maximum sterile barriers used  and Seldinger technique used Allen's test indicative of satisfactory collateral circulation Procedure performed without using ultrasound guided technique. Following insertion, dressing applied and Biopatch. Post procedure assessment: normal

## 2021-02-01 NOTE — H&P (Signed)
History and Physical:    RAINE ALWARDT   [id number] DOB: [birth year] DOA: 02/01/2021  Referring MD/provider: Dr. Donnetta Hutching PCP: [name]   Patient coming from: Home  Chief Complaint: Infected axillary bifemoral graft, status post removal earlier today  History of Present Illness:   NANDAN BOLLMANN is an 75 y.o. 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  Patient was noted to have an infected graft with purulent drainage.  Patient did not have any systemic symptoms at the time.  Patient was scheduled for surgery today and tolerated removal of his entire graft.  ED Course:  The patient was noted to be hypotensive despite use of crystalloid in the postop period.  Patient had been requiring Neo-Synephrine IV.  H&H had decreased by 2 points and he was transfused 1 unit PRBC which he tolerated without evidence of fluid overload.  Patient was able to come off Neo-Synephrine and subsequently became very hypertensive requiring as needed hydralazine.  Patient is now stable to come to the progressive unit status post transfusion with reasonable blood pressure and no evidence of fluid overload.  Patient had been seen by nephrology who noted no emergent need for dialysis at present.  ROS:   ROS   Review of Systems: Unable to provide due to postop.   Past Medical History:   Past Medical History:  Diagnosis Date  . AAA (abdominal aortic aneurysm) ([place])   . Anemia   . Anxiety   . Arthritis    arms and back  . Blindness   . CHF (congestive heart failure) ([place])   . Coronary artery  disease    mild   . 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 Diabetes mellitus without complication ([place])   . ESRD on hemodialysis ([place])    started HD 2014-15  . Headache(784.0)   . HTN (hypertension)   . Protein-calorie malnutrition ([place])   . STEMI (ST elevation myocardial infarction) ([place]) 11/11/2018  . Stroke [facility]) 01/2018   no residual deficits    Past Surgical History:   Past Surgical History:  Procedure Laterality Date  . A/V FISTULAGRAM N/A 07/29/2018   Procedure: A/V FISTULAGRAM - left upper extremity;  Surgeon: Waynetta Sandy, MD;  Location: [place] CV LAB;  Service: Cardiovascular;  Laterality: N/A;  . AMPUTATION Left 03/01/2019   Procedure: AMPUTATION ABOVE KNEE;  Surgeon: Angelia Mould, MD;  Location: [place];  Service: Vascular;  Laterality: Left;  . AXILLARY-FEMORAL BYPASS GRAFT  06/28/2017   Procedure: BYPASS GRAFT RIGHT AXILLA-BIFEMORAL USING 8MM X 30CM AND 8MM X 60CM HEMASHIELD GOLD GRAFTS;  Surgeon: Rosetta Posner, MD;  Location: [place];  Service: Vascular;;  . CARDIAC CATHETERIZATION N/A 11/01/2016   Procedure: Left Heart Cath and Coronary Angiography;  Surgeon: Lorretta Harp, MD;  Location: [place] [facility];  Service: Cardiovascular;  Laterality: N/A;  . ENDARTERECTOMY FEMORAL Bilateral 06/28/2017   Procedure: ENDARTERECTOMY BILATERAL FEMORAL ARTERIES;  Surgeon: Rosetta Posner, MD;  Location: [place];  Service: Vascular;  Laterality: Bilateral;  . FISTULA SUPERFICIALIZATION Left 05/20/2018  Procedure: PLICATION OF ARTERIOVENOUS FISTULA LEFT ARM;  Surgeon: Waynetta Sandy, MD;  Location: [place];  Service: Vascular;  Laterality: Left;  . FISTULOGRAM Left 05/20/2018   Procedure: FISTULOGRAM WITH BALLOON ANGIOPLASTY LEFT ARM ARTERIOVENOUS FISTULA;  Surgeon: Waynetta Sandy, MD;  Location: [place];  Service: Vascular;  Laterality: Left;  . INSERTION OF DIALYSIS CATHETER  09/21/2012    Procedure: INSERTION OF DIALYSIS CATHETER;  Surgeon: Angelia Mould, MD;  Location: [place];  Service: Vascular;  Laterality: N/A;  Right Internal Jugular Placement  . LEFT HEART CATH N/A 11/11/2018   Procedure: LEFT HEART CATH;  Surgeon: Sherren Mocha, MD;  Location: [place] CV LAB;  Service: Cardiovascular;  Laterality: N/A;  . LEFT HEART CATHETERIZATION WITH CORONARY ANGIOGRAM N/A 09/28/2012   Procedure: LEFT HEART CATHETERIZATION WITH CORONARY ANGIOGRAM;  Surgeon: Sherren Mocha, MD;  Location: [facility] CATH LAB;  Service: Cardiovascular;  Laterality: N/A;  . LEFT HEART CATHETERIZATION WITH CORONARY ANGIOGRAM N/A 07/07/2014   Procedure: LEFT HEART CATHETERIZATION WITH CORONARY ANGIOGRAM;  Surgeon: Leonie Man, MD;  Location: [facility] CATH LAB;  Service: Cardiovascular;  Laterality: N/A;  . PERIPHERAL VASCULAR BALLOON ANGIOPLASTY Left 07/29/2018   Procedure: PERIPHERAL VASCULAR BALLOON ANGIOPLASTY;  Surgeon: Waynetta Sandy, MD;  Location: [place] CV LAB;  Service: Cardiovascular;  Laterality: Left;  CENTRAL VEIN  . PERIPHERAL VASCULAR INTERVENTION Left 07/29/2018   Procedure: PERIPHERAL VASCULAR INTERVENTION;  Surgeon: Waynetta Sandy, MD;  Location: [place] CV LAB;  Service: Cardiovascular;  Laterality: Left;  AXILLARY VEIN  . REVISION OF ARTERIOVENOUS GORETEX GRAFT Left 12/30/2018   Procedure: REVISION OF ARTERIOVENOUS FISTULA LEFT ARM;  Surgeon: Rosetta Posner, MD;  Location: [place];  Service: Vascular;  Laterality: Left;  . REVISON OF ARTERIOVENOUS FISTULA Left 08/17/2018   Procedure: REVISION OF ARTERIOVENOUS FISTULA LEFT ARM;  Surgeon: Waynetta Sandy, MD;  Location: [place];  Service: Vascular;  Laterality: Left;  . right hand    . TEE WITHOUT CARDIOVERSION  10/09/2011   Procedure: TRANSESOPHAGEAL ECHOCARDIOGRAM (TEE);  Surgeon: Lelon Perla, MD;  Location: [facility] ENDOSCOPY;  Service: Cardiovascular;  Laterality: N/A;    Social History:   Social History    Socioeconomic History  . Marital status: Widowed    Spouse name: Not on file  . Number of children: 2  . Years of education: Not on file  . Highest education level: Not on file  Occupational History  . Not on file  Tobacco Use  . Smoking status: Current Every Day Smoker    Packs/day: 0.50    Years: 60.00    Pack years: 30.00    Types: Cigarettes  . Smokeless tobacco: Never Used  . Tobacco comment: 5-6 cigarettes per day  Vaping Use  . Vaping Use: Never used  Substance and Sexual Activity  . Alcohol use: No    Alcohol/week: 0.0 standard drinks    Comment: Occasional  . Drug use: No    Frequency: 2.0 times per week    Types: "Crack" cocaine  . Sexual activity: Yes  Other Topics Concern  . Not on file  Social History Narrative   Used to work in a Loss adjuster, chartered.    Social Determinants of Health   Financial Resource Strain: Not on file  Food Insecurity: Not on file  Transportation Needs: Not on file  Physical Activity: Not on file  Stress: Not on file  Social Connections: Not on file  Intimate Partner Violence: Not on file    Allergies  Patient has no known allergies.  Family history:   Family History  Problem Relation Age of Onset  . Heart attack Mother        MI in her 75s  . Diabetes Mother   . Alcohol abuse Father   . Anesthesia problems Neg Hx   . Hypotension Neg Hx   . Malignant hyperthermia Neg Hx   . Pseudochol deficiency Neg Hx     Current Medications:   Prior to Admission medications   Medication Sig Start Date End Date Taking? Authorizing Provider  aspirin EC 81 MG tablet Take 1 tablet (81 mg total) by mouth daily. 11/15/18  Yes Bhagat, Bhavinkumar, PA  atorvastatin (LIPITOR) 80 MG tablet Take 1 tablet (80 mg total) by mouth at bedtime. 11/15/18  Yes Bhagat, Bhavinkumar, PA  b complex-vitamin c-folic acid (NEPHRO-VITE) 0.8 MG TABS tablet Take 1 tablet by mouth daily.   Yes [provider]  clopidogrel (PLAVIX) 75 MG tablet Take 1  tablet (75 mg total) by mouth daily. 04/05/15  Yes Barton Dubois, MD  gabapentin (NEURONTIN) 100 MG capsule Take 100 mg by mouth 2 (two) times daily. 9am 5pm 01/17/21  Yes [provider]  gabapentin (NEURONTIN) 300 MG capsule Take 1 capsule (300 mg total) by mouth at bedtime. 01/14/18  Yes Colbert Ewing, MD  Icosapent Ethyl (VASCEPA) 1 g CAPS Take 1 g by mouth 2 (two) times daily.    Yes [provider]  Ipratropium-Albuterol (COMBIVENT IN) Inhale 2 puffs into the lungs every 8 (eight) hours as needed (shortness of breath, wheezing). Dose 18-103   Yes [provider]  isosorbide mononitrate (IMDUR) 30 MG 24 hr tablet Take 1 tablet (30 mg total) by mouth daily. 11/22/18 07/09/21 Yes Kilroy, Luke K, PA-C  metoprolol tartrate (LOPRESSOR) 25 MG tablet Take 0.5 tablets (12.5 mg total) by mouth 2 (two) times daily. 11/15/18  Yes Bhagat, Bhavinkumar, PA  mirtazapine (REMERON) 7.5 MG tablet Take 7.5 mg by mouth at bedtime.   Yes [provider]  senna (SENOKOT) 8.6 MG TABS tablet Take 1 tablet by mouth daily.   Yes [provider]  sevelamer carbonate (RENVELA) 800 MG tablet Take 1,600 mg by mouth 3 (three) times daily with meals.  12/13/18  Yes [provider]  tiotropium (SPIRIVA HANDIHALER) 18 MCG inhalation capsule Place 18 mcg into inhaler and inhale daily.   Yes [provider]  traZODone (DESYREL) 50 MG tablet Take 50 mg by mouth at bedtime.   Yes [provider]  docusate sodium (COLACE) 100 MG capsule Take 200 mg by mouth at bedtime.    [provider]  nitroGLYCERIN (NITROSTAT) 0.4 MG SL tablet Place 1 tablet (0.4 mg total) under the tongue every 5 (five) minutes x 3 doses as needed for chest pain. 11/15/18   Bhagat, Bhavinkumar, PA  ondansetron (ZOFRAN) 4 MG tablet Take 4 mg by mouth every 6 (six) hours as needed for nausea or vomiting.    [provider]  polyethylene glycol powder (GLYCOLAX/MIRALAX) 17 GM/SCOOP  powder Take 17 g by mouth daily. (in liquid)    [provider]    Physical Exam:   Vitals:   02/01/21 1727 02/01/21 1730 02/01/21 1739 02/01/21 1830  BP: (!) 208/69  (!) 167/93   Pulse:      Resp: (!) 28 (!) 29 (!) 27   Temp:      TempSrc:      SpO2:    100%  Weight:  Height:         Physical Exam: Blood pressure (!) 167/93, pulse 82, temperature (!) 97.2 F (36.2 C), resp. rate (!) 27, height '5\' 5"'$  (1.651 m), weight 44.9 kg, SpO2 100 %. Gen: Chronically ill-appearing man looking older than stated age lying in bed complaining of right lower extremity pain. Eyes: sclera anicteric, conjuctiva mildly injected bilaterally CVS: S1-S2, regulary, no gallops Respiratory:  decreased air entry likely secondary to decreased inspiratory effort GI: NABS, soft, NT  LE: Status post left AKA.  First left toe with some erythema and bogginess, possibly early gangrene.  Right lower extremity with diminished muscle mass and changes consistent with vasculopathy. Neuro: A/O x 3,grossly nonfocal.    Data Review:    Labs: Basic Metabolic Panel: Recent Labs  Lab 02/01/21 0730 02/01/21 1353  NA 136 138  K 5.1 4.7  CL 97*  --   CO2 28  --   GLUCOSE 71  --   BUN 26*  --   CREATININE 5.35*  --   CALCIUM 8.8*  --    Liver Function Tests: Recent Labs  Lab 02/01/21 0730  AST 22  ALT 15  ALKPHOS 110  BILITOT 0.9  PROT 6.3*  ALBUMIN 2.6*   No results for input(s): LIPASE, AMYLASE in the last 168 hours. No results for input(s): AMMONIA in the last 168 hours. CBC: Recent Labs  Lab 02/01/21 0730 02/01/21 1353  WBC 4.6  --   HGB 10.9* 8.2*  HCT 34.5* 24.0*  MCV 92.7  --   PLT 150  --    Cardiac Enzymes: No results for input(s): CKTOTAL, CKMB, CKMBINDEX, TROPONINI in the last 168 hours.  BNP (last 3 results) No results for input(s): PROBNP in the last 8760 hours. CBG: Recent Labs  Lab 02/01/21 0724 02/01/21 0933 02/01/21 1608 02/01/21 1723 02/01/21 1838   GLUCAP 75 71 57* 71 155*    Urinalysis    Component Value Date/Time   COLORURINE YELLOW 04/04/2017 1833   APPEARANCEUR CLEAR 04/04/2017 1833   LABSPEC 1.008 04/04/2017 1833   PHURINE 9.0 (H) 04/04/2017 1833   GLUCOSEU 50 (A) 04/04/2017 1833   HGBUR MODERATE (A) 04/04/2017 1833   BILIRUBINUR NEGATIVE 04/04/2017 1833   [place] 04/04/2017 1833   PROTEINUR 100 (A) 04/04/2017 1833   UROBILINOGEN 0.2 01/08/2015 2116   NITRITE NEGATIVE 04/04/2017 1833   LEUKOCYTESUR NEGATIVE 04/04/2017 1833      Radiographic Studies: No results found.  EKG: Ordered and pending   Assessment/Plan:   Principal Problem:   Infected prosthetic vascular graft (HCC) Active Problems:   A-fib (HCC)   End-stage renal disease on hemodialysis ([place])   Abdominal aortic aneurysm (HCC)   Accelerated hypertension   Essential hypertension   Hypertensive urgency   Congestive heart failure (CHF) ([place])   Vasculopathy  Wheelchair-bound nursing home patient with ESRD, combined systolic and diastolic heart failure with severe advanced vasculopathy is admitted postop after removal of axillary bifemoral graft secondary to graft infection.  Postop course has been complicated by anemia and initial hypotension and subsequent hypertension.  Status post removal of axillary bifemoral graft secondary to graft infection infection Patient will need broad-spectrum antibiotics pending blood culture and intraoperative wound culture Will start patient on cefepime and vancomycin  Severe end-stage peripheral vascular disease with possible gangrene Removal of graft will result in extremely poor blood flow to patient's right lower extremity with likely ischemia.  Discussed with vascular surgery, they would not be surprised if gangrene develops.  Plan  would be for amputation if patient develops gangrene but hope for adequate blood flow with collateral formation. Continue aspirin, Plavix. Continue her Neurontin per home  doses for pain management We will start as needed morphine for pain management Broad-spectrum antibiotics as noted above  Hypertensive urgency in ESRD Patient was initially hypotensive thought to be secondary to intraoperative blood loss Blood pressure has rebounded and is high after transfusion Will restart patient on his BP meds to start now As needed hydralazine ordered Patient is for dialysis in the morning per nephrology note earlier today  Acute on chronic anemia with intraoperative blood loss Hemoglobin decreased by 2 points postop He was transfused 1 unit PRBC in PACU and has tolerated that well Follow H&H and transfuse as warranted.  Combined systolic and diastolic heart failure with history of pulmonary edema Patient with history of pulmonary edema and decompensated heart failure Will need to follow lung exam very closely given crystalloids and blood that he is received postop At present his lungs are clear, chest x-ray is clear and he has tolerated 1 unit PRBC well Patient is for HD in the morning which will also help with fluid management  History of atrial fibrillation On metoprolol for rate control EKG has been ordered and pending  COPD No evidence for acute flare Continue Spiriva, as needed duo nebs requested  CAD  Continue Imdur, aspirin, Plavix, atorvastatin and metoprolol per home doses  Anxiety and depression Continue mirtazapine and trazodone per home doses    Other information:   DVT prophylaxis: Subcu heparin ordered. Code Status: Full Family Communication: Spoke  Disposition Plan: [name] Consults called: Vascular Surgery Admission status: Inpatient  Jacayla Nordell Tublu Lakita Sahlin [facility]  If 7PM-7AM, please contact night-coverage [email or web address] Password [facility] 02/01/2021, 6:52 PM

## 2021-02-01 NOTE — Transfer of Care (Signed)
Immediate Anesthesia Transfer of Care Note  Patient: Trevor [name]  Procedure(s) Performed: REMOVAL OF AXILLARY-BIFEMORAL BYPASS GRAFT (Bilateral Groin) VEIN PATCH ANGIOPLASTY LEFT COMMON FEMORAL ARTERY (Right Groin) GREATER SAPHENOUS VEIN HARVEST (Right Leg Upper)  Patient Location: PACU  Anesthesia Type:General  Level of Consciousness: drowsy and patient cooperative  Airway & Oxygen Therapy: Patient Spontanous Breathing  Post-op Assessment: Report given to RN, Post -op Vital signs reviewed and stable and Patient moving all extremities X 4  Post vital signs: Reviewed and stable  Last Vitals:  Vitals Value Taken Time  BP 148/58 02/01/21 1457  Temp    Pulse 85 02/01/21 1457  Resp 21 02/01/21 1458  SpO2 94 % 02/01/21 1457  Vitals shown include unvalidated device data.  Last Pain:  Vitals:   02/01/21 0709  TempSrc:   PainSc: 9          Complications: No complications documented.

## 2021-02-01 NOTE — Progress Notes (Signed)
Spoke with Dr. Roanna Banning regarding elevated BP.  Order to give Metoprolol 12.5 mg.  Also, informed MD that patient has a U/A ordered, but does not produce urine daily.  He stated that was okay.

## 2021-02-01 NOTE — Op Note (Signed)
OPERATIVE REPORT  DATE OF SURGERY: 02/01/2021  PATIENT: Trevor [name], 75 y.o. male MRN: [id number]  DOB: [birth year]  PRE-OPERATIVE DIAGNOSIS: Infected right axillofemoral and right to left femorofemoral bypass  POST-OPERATIVE DIAGNOSIS:  Same  PROCEDURE: Removal of right axillofemoral and femorofemoral bypass with vein patch of the right axillary artery and left common femoral artery  SURGEON:  Curt Jews, M.D.  Co-surgeon: Dr. [name] who will dictate the left femoral portion of the procedure  PHYSICIAN ASSISTANT: Arlee Muslim, PA-C  The assistant was needed for exposure and to expedite the case  ANESTHESIA: General  EBL: per anesthesia record  Total I/O In: 1000 [I.V.:500; IV Piggyback:500] Out: 300 [Urine:100; Blood:200]  BLOOD ADMINISTERED: none  DRAINS: none  SPECIMEN: none  COUNTS CORRECT:  YES  PATIENT DISPOSITION:  PACU - hemodynamically stable  PROCEDURE DETAILS: The patient was taken to the operating room placed supine position where the area of the right shoulder right chest entire right groin and leg and left groin were prepped and draped in usual sterile fashion.  Patient had an open draining sinus in the right groin over the graft.  An incision was made over the old scar and carried down to isolate the graft which was not incorporated.  The right the left portion of the graft was an end-to-side anastomosis to the Dacron graft.  The external iliac artery was controlled at the inguinal ligament.  The superficial femoral artery was encircled with a blue vessel loop.  This was chronically occluded.  Profunda was also encircled.  Saphenous vein was harvested in the right groin and a separate counterincision was made lower on the thigh to give adequate saphenous vein for patching of all 3 anastomoses.  After exposure the external iliac, SFA and profundus were occluded on the right.  The old graft was occluded and was excised from the arterial  anastomosis.  There was no inflow from the external iliac artery and no flow with a chronically occluded superficial femoral artery.  There was backbleeding from the profundus femoris artery.  Decision was made to oversew the orifice of the profundus since there was no inflow or outflow otherwise.  This was done with 2 layers of 5-0 Prolene suture.  Next incision was made over the old scar in the right infraclavicular area.  This was carried down to isolate the subclavian artery proximal and distal to the old proximal anastomosis.  The Dacron graft was also exposed at this anastomosis.  There was no evidence of purulence and the graft was incorporated in this area.  The patient was given 5000 units of intravenous heparin and after adequate circulation time the subclavian artery was occluded proximally and distally and the graft was transected below the anastomosis.  The graft was removed in entirety.  A portion of the saphenous vein which had prior been harvested was spatulated and sewn as a patch angioplasty over the defect in the subclavian artery.  The usual flushing maneuvers were undertaken and anastomosis was completed.  Patient had a right radial arterial line and had restoration of normal flow in the right side.  Several additional sutures were required for hemostasis.  After completion of the graft removal  will be dictated by Dr.Dickson, the patient was given 50 mg of protamine.  The femorofemoral graft was removed in its entirety.  A separate incision was made approximately 5 cm below the subclavian incision over the graft.  The graft was not incorporated at this area.  It was  removed from this area to the subclavian incision.  The graft was removed with simple traction through the groin.  The tunnels were irrigated with saline.  Gated with saline and hemostasis obtained with cautery.  The incisions were closed with 2-0 Vicryl in the fascia and the subcutaneous TRIS.  Sterile dressings were applied and  the patient was transferred to the recovery room in stable condition.   Rosetta Posner, M.D., [facility] 02/01/2021 2:33 PM  Note: Portions of this report may have been transcribed using voice recognition software.  Every effort has been made to ensure accuracy; however, inadvertent computerized transcription errors may still be present.

## 2021-02-01 NOTE — Anesthesia Preprocedure Evaluation (Addendum)
Anesthesia Evaluation  Patient identified by MRN, date of birth, ID band Patient awake    Reviewed: Allergy & Precautions, NPO status , Patient's Chart, lab work & pertinent test results  Airway Mallampati: III  TM Distance: >3 FB Neck ROM: Full    Dental  (+) Edentulous Upper, Edentulous Lower   Pulmonary Current Smoker and Patient abstained from smoking.,    Pulmonary exam normal breath sounds clear to auscultation       Cardiovascular hypertension, + CAD, + Past MI, + Peripheral Vascular Disease and +CHF  Normal cardiovascular exam Rhythm:Regular Rate:Normal  ECG: NSR, rate 74   Neuro/Psych  Headaches, Anxiety blindness, 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 CVA, Residual Symptoms    GI/Hepatic negative GI ROS, Neg liver ROS,   Endo/Other  negative endocrine ROSdiabetes  Renal/GU ESRF and DialysisRenal diseaseOn HD M, W, F     Musculoskeletal  (+) Arthritis , Wheelchair bound   Abdominal   Peds  Hematology  (+) anemia , HLD   Anesthesia Other Findings RIGHT GROIN INFECTION   Reproductive/Obstetrics                            Anesthesia Physical Anesthesia Plan  ASA: IV  Anesthesia Plan: General   Post-op Pain Management:    Induction: Intravenous  PONV Risk Score and Plan: 1 and Ondansetron, Dexamethasone and Treatment may vary due to age or medical condition  Airway Management Planned: Oral ETT  Additional Equipment: Arterial line  Intra-op Plan:   Post-operative Plan: Extubation in OR  Informed Consent: I have reviewed the patients History and Physical, chart, labs and discussed the procedure including the risks, benefits and alternatives for the proposed anesthesia with the patient or authorized representative who has indicated his/her understanding and acceptance.       Plan Discussed with: CRNA  Anesthesia Plan Comments:        Anesthesia Quick Evaluation

## 2021-02-01 NOTE — Anesthesia Procedure Notes (Signed)
Procedure Name: Intubation Date/Time: 02/01/2021 10:56 AM Performed by: Betha Loa, CRNA Pre-anesthesia Checklist: Patient identified, Emergency Drugs available, Suction available, Patient being monitored and Timeout performed Patient Re-evaluated:Patient Re-evaluated prior to induction Oxygen Delivery Method: Circle system utilized Preoxygenation: Pre-oxygenation with 100% oxygen Induction Type: IV induction Ventilation: Mask ventilation without difficulty and Oral airway inserted - appropriate to patient size Laryngoscope Size: Mac and 3 Grade View: Grade I Tube type: Oral Number of attempts: 1 Airway Equipment and Method: Stylet Placement Confirmation: ETT inserted through vocal cords under direct vision,  positive ETCO2 and breath sounds checked- equal and bilateral Secured at: 23 cm Tube secured with: Tape Dental Injury: Teeth and Oropharynx as per pre-operative assessment  Comments: Intubated by [place], paramedic student

## 2021-02-02 DIAGNOSIS — T827XXA Infection and inflammatory reaction due to other cardiac and vascular devices, implants and grafts, initial encounter: Secondary | ICD-10-CM | POA: Diagnosis not present

## 2021-02-02 DIAGNOSIS — I10 Essential (primary) hypertension: Secondary | ICD-10-CM

## 2021-02-02 DIAGNOSIS — I714 Abdominal aortic aneurysm, without rupture: Secondary | ICD-10-CM

## 2021-02-02 DIAGNOSIS — N186 End stage renal disease: Secondary | ICD-10-CM

## 2021-02-02 DIAGNOSIS — L8990 Pressure ulcer of unspecified site, unspecified stage: Secondary | ICD-10-CM | POA: Insufficient documentation

## 2021-02-02 DIAGNOSIS — I480 Paroxysmal atrial fibrillation: Secondary | ICD-10-CM | POA: Diagnosis not present

## 2021-02-02 DIAGNOSIS — Z992 Dependence on renal dialysis: Secondary | ICD-10-CM

## 2021-02-02 LAB — COMPREHENSIVE METABOLIC PANEL
ALT: 9 U/L (ref 0–44)
AST: 18 U/L (ref 15–41)
Albumin: 2.7 g/dL — ABNORMAL LOW (ref 3.5–5.0)
Alkaline Phosphatase: 81 U/L (ref 38–126)
Anion gap: 12 (ref 5–15)
BUN: 29 mg/dL — ABNORMAL HIGH (ref 8–23)
CO2: 24 mmol/L (ref 22–32)
Calcium: 8.8 mg/dL — ABNORMAL LOW (ref 8.9–10.3)
Chloride: 100 mmol/L (ref 98–111)
Creatinine, Ser: 5.94 mg/dL — ABNORMAL HIGH (ref 0.61–1.24)
GFR, Estimated: 9 mL/min — ABNORMAL LOW (ref >60)
Glucose, Bld: 124 mg/dL — ABNORMAL HIGH (ref 70–99)
Potassium: 4.6 mmol/L (ref 3.5–5.1)
Sodium: 136 mmol/L (ref 135–145)
Total Bilirubin: 1.1 mg/dL (ref 0.3–1.2)
Total Protein: 5.8 g/dL — ABNORMAL LOW (ref 6.5–8.1)

## 2021-02-02 LAB — CBC
HCT: 26.4 % — ABNORMAL LOW (ref 39.0–52.0)
Hemoglobin: 8.7 g/dL — ABNORMAL LOW (ref 13.0–17.0)
MCH: 29.3 pg (ref 26.0–34.0)
MCHC: 33 g/dL (ref 30.0–36.0)
MCV: 88.9 fL (ref 80.0–100.0)
Platelets: 155 10*3/uL (ref 150–400)
RBC: 2.97 MIL/uL — ABNORMAL LOW (ref 4.22–5.81)
RDW: 14 % (ref 11.5–15.5)
WBC: 7.1 10*3/uL (ref 4.0–10.5)
nRBC: 0 % (ref 0.0–0.2)

## 2021-02-02 LAB — GLUCOSE, CAPILLARY: Glucose-Capillary: 99 mg/dL (ref 70–99)

## 2021-02-02 LAB — MAGNESIUM: Magnesium: 2.1 mg/dL (ref 1.7–2.4)

## 2021-02-02 MED ORDER — MORPHINE SULFATE (PF) 2 MG/ML IV SOLN
INTRAVENOUS | Status: AC
  Filled 2021-02-02: qty 1

## 2021-02-02 MED ORDER — DOXERCALCIFEROL 4 MCG/2ML IV SOLN
INTRAVENOUS | Status: AC
  Filled 2021-02-02: qty 2

## 2021-02-02 MED ORDER — CHLORHEXIDINE GLUCONATE CLOTH 2 % EX PADS: 6.0000 | MEDICATED_PAD | Freq: Every day | CUTANEOUS | Status: DC

## 2021-02-02 MED ORDER — CINACALCET HCL 30 MG PO TABS
60.0000 mg | ORAL_TABLET | ORAL | Status: DC
  Administered 2021-02-02: 60 mg via ORAL
  Filled 2021-02-02: qty 2

## 2021-02-02 MED ORDER — DOXERCALCIFEROL 4 MCG/2ML IV SOLN
2.0000 ug | INTRAVENOUS | Status: DC
  Administered 2021-02-02: 2 ug via INTRAVENOUS

## 2021-02-02 MED ORDER — TRAMADOL HCL 50 MG PO TABS
50.0000 mg | ORAL_TABLET | Freq: Two times a day (BID) | ORAL | Status: DC
  Administered 2021-02-02 – 2021-02-03 (×2): 50 mg via ORAL
  Filled 2021-02-02 (×2): qty 1

## 2021-02-02 MED ORDER — VANCOMYCIN HCL 500 MG/100ML IV SOLN: 500.0000 mg | Freq: Once | INTRAVENOUS | Status: DC

## 2021-02-02 MED ORDER — DARBEPOETIN ALFA 60 MCG/0.3ML IJ SOSY: 60.0000 ug | PREFILLED_SYRINGE | INTRAMUSCULAR | Status: DC

## 2021-02-02 MED ORDER — ACETAMINOPHEN 325 MG PO TABS
650.0000 mg | ORAL_TABLET | Freq: Four times a day (QID) | ORAL | Status: DC
  Administered 2021-02-02 – 2021-02-04 (×5): 650 mg via ORAL
  Filled 2021-02-02 (×5): qty 2

## 2021-02-02 MED ORDER — OXYCODONE HCL 5 MG PO TABS
5.0000 mg | ORAL_TABLET | ORAL | Status: DC | PRN
  Administered 2021-02-02 – 2021-02-03 (×3): 10 mg via ORAL
  Filled 2021-02-02 (×3): qty 2

## 2021-02-02 MED ORDER — HYDROMORPHONE HCL 1 MG/ML IJ SOLN
0.5000 mg | INTRAMUSCULAR | Status: DC | PRN
Start: 2021-02-02 — End: 2021-02-03
  Administered 2021-02-02: 0.5 mg via INTRAVENOUS
  Filled 2021-02-02: qty 1

## 2021-02-02 NOTE — Progress Notes (Signed)
   ASSESSMENT & PLAN:  Trevor [name] is a 75 y.o. male status post excision of infected axillary-bi-femoral bypass 02/01/21.   PRN pain control Diet as tolerated HD per nephrology Periop Ancef x 24 hours. No need for further antibiosis from my standpoint.   Very poor prognosis: cachexia, prior amputation, no options for RLE revascularization. Recommend palliative care evaluation. Comfort measures only would be appropriate for this gentleman.  SUBJECTIVE:  Somnolent. No complaints. Reports no pain in RLE.  OBJECTIVE:  BP (!) 111/57   Pulse 85   Temp 98.4 F (36.9 C) (Oral)   Resp (!) 23   Ht '5\' 5"'$  (1.651 m)   Wt 45.3 kg   SpO2 98%   BMI 16.62 kg/m   Intake/Output Summary (Last 24 hours) at 02/02/2021 1011 Last data filed at 02/02/2021 [id number] Gross per 24 hour  Intake 2275 ml  Output 410 ml  Net 1865 ml    NAD  On HD RRR Unlabored Incisions clean and dry No ischemic changes to R foot L AKA  CBC Latest Ref Rng & Units 02/02/2021 02/01/2021 02/01/2021  WBC 4.0 - 10.5 K/uL 7.1 8.6 -  Hemoglobin 13.0 - 17.0 g/dL 8.7(L) 9.2(L) 8.2(L)  Hematocrit 39.0 - 52.0 % 26.4(L) 27.6(L) 24.0(L)  Platelets 150 - 400 K/uL 155 166 -     CMP Latest Ref Rng & Units 02/02/2021 02/01/2021 02/01/2021  Glucose 70 - 99 mg/dL 124(H) - -  BUN 8 - 23 mg/dL 29(H) - -  Creatinine 0.61 - 1.24 mg/dL 5.94(H) 5.68(H) -  Sodium 135 - 145 mmol/L 136 - 138  Potassium 3.5 - 5.1 mmol/L 4.6 - 4.7  Chloride 98 - 111 mmol/L 100 - -  CO2 22 - 32 mmol/L 24 - -  Calcium 8.9 - 10.3 mg/dL 8.8(L) - -  Total Protein 6.5 - 8.1 g/dL 5.8(L) - -  Total Bilirubin 0.3 - 1.2 mg/dL 1.1 - -  Alkaline Phos 38 - 126 U/L 81 - -  AST 15 - 41 U/L 18 - -  ALT 0 - 44 U/L 9 - -    Estimated Creatinine Clearance: 6.9 mL/min (A) (by C-G formula based on SCr of 5.94 mg/dL (H)).  Yevonne Aline. Stanford Breed, MD [facility] Phone Number: [phone] 02/02/2021 10:11 AM

## 2021-02-02 NOTE — Progress Notes (Signed)
PROGRESS NOTE    Trevor [name]  [id number] DOB: 02-18-1946 DOA: 02/01/2021 PCP: [name]   Brief Narrative:  Trevor [name] is an 75 y.o. 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  Patient was noted to have an infected graft with purulent drainage.    ED Course:  The patient was noted to be hypotensive despite use of crystalloid in the postop period.  Patient had been requiring Neo-Synephrine IV.  H&H had decreased by 2 points and he was transfused 1 unit PRBC which he tolerated without evidence of fluid overload.  Patient was able to come off Neo-Synephrine and subsequently became very hypertensive requiring as needed hydralazine.  Patient is now stable to come to the progressive unit status post transfusion with reasonable blood pressure and no evidence of fluid overload.  Patient had been seen by nephrology who noted no emergent need for dialysis at present.  He is status post excision of infected axillary bifemoral bypass on 3/4 by vascular surgery.  Assessment & Plan:  Infected right axillary bifemoral graft: -Status post removal of infected graft on 3/4 by vascular surgery -Patient is on Vanco and cefepime -Vascular surgery on board-thinks maybe poor prognosis overall, no options for RLE revascularization and recommend palliative care consultation for comfort measures. -Recommend to discontinue IV antibiotics -Continue as needed pain medication -Consult palliative care  Severe end-stage peripheral vascular disease with possible gangrene: -Not a candidate for RLE  revascularization as per vascular surgery -Continue aspirin, Plavix -Continue Neurontin, as needed pain medications.  Hypertensive urgency: -Patient was initially hypotensive thought to be secondary to intraoperative blood loss.  Status post 1 unit blood transfusion -Blood pressure has been elevated since then.  Continue as needed hydralazine.  Hopefully his blood pressure improve after dialysis.  Acute on chronic anemia with intraoperative blood loss: -Hemoglobin dropped by two-point yesterday.  S/p 1 unit PRBC transfusion on 3/4. -Monitor H&H closely and transfuse if hemoglobin less than 7.  ESRD on hemodialysis: -Appreciate nephrology's help with the dialysis.  Chronic combined systolic and diastolic CHF: -Patient appears euvolemic on exam.  He received IV fluid and blood yesterday postoperatively. -Strict INO's and daily weight. -Monitor electrolytes.  Paroxysmal A. fib: Rate controlled -Continue metoprolol.  COPD with ongoing tobacco abuse: -On room air.  No wheezing noted on exam. -Continue home inhalers  Coronary artery disease: No ACS symptoms -Continue Imdur, aspirin, Plavix, statin, metoprolol  Anxiety and depression: -Continue mirtazapine and trazodone  Goals of care: Overall poor prognosis in the setting of multiple chronic medical issues.  Vascular surgery-not a candidate for RLE revascularization.  Consulted palliative care.    I called patient's daughter [name] and discussed-CODE STATUS and she would like to continue full scope of care at this time. She will also talk/discuss with her other family members.  She is aware of overall poor prognosis.   DVT prophylaxis: Heparin Code Status: Full code-confirmed with patient's daughter Family Communication: None present at bedside.  Plan of care discussed with patient in length and he verbalized understanding and agreed with it. Disposition Plan: To be determined  Consultants:   Vascular surgery  Palliative  care  Procedures:   Status post removal of infected axillary bifemoral graft on 3/4 by vascular surgery  Antimicrobials:   Vancomycin  Cefepime  Status is: Inpatient   Dispo: The patient is from: [name]              Anticipated d/c is to: [name]              Patient currently is not medically stable to d/c.   Difficult to place patient No    Subjective: Patient seen and examined in hemodialysis.  Resting comfortably on the bed.  Tells me that he feels weak and tired due to the procedure yesterday.  Denies chest pain, fever, chills, shortness of breath, headache or blurry vision.  Objective: Vitals:   02/02/21 0855 02/02/21 0905 02/02/21 0925 02/02/21 0955  BP: (!) 80/47 (!) 92/47 (!) 82/48 (!) 111/57  Pulse:      Resp: '16 16 12 '$ (!) 23  Temp:      TempSrc:      SpO2:      Weight:      Height:        Intake/Output Summary (Last 24 hours) at 02/02/2021 1101 Last data filed at 02/02/2021 [id number] Gross per 24 hour  Intake 1775 ml  Output 410 ml  Net 1365 ml   Filed Weights   02/01/21 0618 02/01/21 2200 02/02/21 0750  Weight: 44.9 kg 45.6 kg 45.3 kg    Examination:  General exam: Appears calm and comfortable, elderly looking, weak, sick, on room air Respiratory system: Clear to auscultation. Respiratory effort normal. Cardiovascular system: S1 & S2 heard, RRR. No JVD, murmurs, rubs, gallops or clicks. No pedal edema. Gastrointestinal system: Abdomen is nondistended, soft and nontender. No organomegaly or masses felt. Normal bowel sounds heard. Central nervous system: Alert and oriented. No focal neurological deficits. Extremities: Status post left AKA.    Psychiatry: Judgement and insight appear normal. Mood & affect appropriate.    Data Reviewed: I have personally reviewed following labs and imaging studies  CBC: Recent Labs  Lab 02/01/21 0730 02/01/21 1353 02/01/21 2154 02/02/21 0225  WBC 4.6  --  8.6 7.1  HGB 10.9* 8.2* 9.2* 8.7*  HCT 34.5* 24.0* 27.6* 26.4*   MCV 92.7  --  88.2 88.9  PLT 150  --  166 [id number]   Basic Metabolic Panel: Recent Labs  Lab 02/01/21 0730 02/01/21 1353 02/01/21 2154 02/02/21 0225  NA 136 138  --  136  K 5.1 4.7  --  4.6  CL 97*  --   --  100  CO2 28  --   --  24  GLUCOSE 71  --   --  124*  BUN 26*  --   --  29*  CREATININE 5.35*  --  5.68* 5.94*  CALCIUM 8.8*  --   --  8.8*  MG  --   --   --  2.1   GFR: Estimated Creatinine Clearance: 6.9 mL/min (A) (by C-G formula based on SCr of 5.94 mg/dL (H)). Liver Function Tests: Recent Labs  Lab 02/01/21 0730 02/02/21 0225  AST 22 18  ALT 15 9  ALKPHOS 110 81  BILITOT 0.9 1.1  PROT 6.3* 5.8*  ALBUMIN 2.6* 2.7*   No results for input(s): LIPASE, AMYLASE in the last 168 hours. No results for input(s): AMMONIA in the last 168 hours. Coagulation Profile: Recent Labs  Lab 02/01/21 0730  INR 1.2   Cardiac Enzymes: No results for input(s): CKTOTAL, CKMB, CKMBINDEX, TROPONINI in  the last 168 hours. BNP (last 3 results) No results for input(s): PROBNP in the last 8760 hours. HbA1C: No results for input(s): HGBA1C in the last 72 hours. CBG: Recent Labs  Lab 02/01/21 0724 02/01/21 0933 02/01/21 1608 02/01/21 1723 02/01/21 1838  GLUCAP 75 71 57* 71 155*   Lipid Profile: No results for input(s): CHOL, HDL, LDLCALC, TRIG, CHOLHDL, LDLDIRECT in the last 72 hours. Thyroid Function Tests: No results for input(s): TSH, T4TOTAL, FREET4, T3FREE, THYROIDAB in the last 72 hours. Anemia Panel: No results for input(s): VITAMINB12, FOLATE, FERRITIN, TIBC, IRON, RETICCTPCT in the last 72 hours. Sepsis Labs: No results for input(s): PROCALCITON, LATICACIDVEN in the last 168 hours.  Recent Results (from the past 240 hour(s))  SARS Coronavirus 2 by RT PCR (hospital order, performed in [facility] hospital lab) Nasopharyngeal Nasopharyngeal Swab     Status: None   Collection Time: 02/01/21  5:54 AM   Specimen: Nasopharyngeal Swab  Result Value Ref Range Status   SARS  Coronavirus 2 NEGATIVE NEGATIVE Final    Comment: (NOTE) SARS-CoV-2 target nucleic acids are NOT DETECTED.  The SARS-CoV-2 RNA is generally detectable in upper and lower respiratory specimens during the acute phase of infection. The lowest concentration of SARS-CoV-2 viral copies this assay can detect is 250 copies / mL. A negative result does not preclude SARS-CoV-2 infection and should not be used as the sole basis for treatment or other patient management decisions.  A negative result may occur with improper specimen collection / handling, submission of specimen other than nasopharyngeal swab, presence of viral mutation(s) within the areas targeted by this assay, and inadequate number of viral copies (<250 copies / mL). A negative result must be combined with clinical observations, patient history, and epidemiological information.  Fact Sheet for Patients:   StrictlyIdeas.no  Fact Sheet for Healthcare Providers: BankingDealers.co.za  This test is not yet approved or  cleared by the [place] FDA and has been authorized for detection and/or diagnosis of SARS-CoV-2 by FDA under an Emergency Use Authorization (EUA).  This EUA will remain in effect (meaning this test can be used) for the duration of the COVID-19 declaration under Section 564(b)(1) of the Act, 21 U.S.C. section 360bbb-3(b)(1), unless the authorization is terminated or revoked sooner.  Performed at [facility], [place] [address], [place], [place] [address]   Aerobic/Anaerobic Culture w Gram Stain (surgical/deep wound)     Status: None (Preliminary result)   Collection Time: 02/01/21 11:30 AM   Specimen: Groin, Right; Wound  Result Value Ref Range Status   Specimen Description WOUND  Final   Special Requests RIGHT GROIN WOUND SPEC A  Final   Gram Stain   Final    NO WBC SEEN NO ORGANISMS SEEN Performed at [facility], [address], [place],  [place] [address]    Culture PENDING  Incomplete   Report Status PENDING  Incomplete      Radiology Studies: DG CHEST PORT 1 VIEW  Result Date: 02/01/2021 CLINICAL DATA:  Shortness of breath EXAM: PORTABLE CHEST 1 VIEW COMPARISON:  07/09/2020 FINDINGS: Left upper extremity vascular stent. No focal opacity or pleural effusion. Stable cardiomediastinal silhouette with aortic atherosclerosis. No pneumothorax. IMPRESSION: No active disease. Electronically Signed   By: Donavan Foil M.D.   On: 02/01/2021 19:40    Scheduled Meds: . aspirin EC  81 mg Oral Daily  . atorvastatin  80 mg Oral QHS  . Chlorhexidine Gluconate Cloth  6 each Topical Once  And  . Chlorhexidine Gluconate Cloth  6 each Topical Once  . Chlorhexidine Gluconate Cloth  6 each Topical Q0600  . cinacalcet  60 mg Oral Q T,Th,Sa-HD  . clopidogrel  75 mg Oral Daily  . [START ON 02/04/2021] darbepoetin (ARANESP) injection - DIALYSIS  60 mcg Intravenous Q Mon-HD  . docusate sodium  200 mg Oral QHS  . doxercalciferol  2 mcg Intravenous Q T,Th,Sa-HD  . gabapentin  100 mg Oral 2 times per day  . gabapentin  300 mg Oral QHS  . heparin  5,000 Units Subcutaneous Q8H  . icosapent Ethyl  1 g Oral BID  . isosorbide mononitrate  30 mg Oral Daily  . metoprolol tartrate  12.5 mg Oral BID  . mirtazapine  7.5 mg Oral QHS  . multivitamin  1 tablet Oral QHS  . sevelamer carbonate  1,600 mg Oral TID WC  . sodium chloride flush  3 mL Intravenous Q12H  . traZODone  50 mg Oral QHS  . umeclidinium bromide  1 puff Inhalation Daily   Continuous Infusions: . sodium chloride    . ceFEPime (MAXIPIME) IV    . vancomycin    . vancomycin       LOS: 1 day   Time spent: 35 minutes  Ryleah Miramontes Loann Quill, MD [facility]  If 7PM-7AM, please contact night-coverage [email or web address] 02/02/2021, 11:01 AM

## 2021-02-02 NOTE — Progress Notes (Signed)
Mobility Specialist: Progress Note   02/02/21 1447  Mobility  Activity Contraindicated/medical hold   Instructed by RN to not see pt today due to pt's pain level. Will f/u tomorrow.   [facility] Aishia Barkey Mobility Specialist Mobility Specialist Phone: [phone]

## 2021-02-02 NOTE — Progress Notes (Signed)
Pharmacy Antibiotic Note  Trevor [name] is a 75 y.o. male admitted on 02/01/2021 with  Infected right axillofemoral and right to left femorofemoral bypass removed 3/4. Pharmacy has been consulted for Cefepime and Vanco dosing.  Afebrile. WBC WNL. Dose for ESRD. Patient did not receive vancomycin and cefepime doses in PACU 3/4. Will dose after HD today.   Plan: HD 3/5 off-schedule Cefepime 1g IV q24h Vanco '500mg'$  IV qHD MWF + tomorrow off-schedule    Height: '5\' 5"'$  (165.1 cm) Weight: 45.6 kg (100 lb 8.5 oz) IBW/kg (Calculated) : 61.5  Temp (24hrs), Avg:97.3 F (36.3 C), Min:97 F (36.1 C), Max:97.6 F (36.4 C)  Recent Labs  Lab 02/01/21 0730 02/01/21 2154 02/02/21 0225  WBC 4.6 8.6 7.1  CREATININE 5.35* 5.68* 5.94*    Estimated Creatinine Clearance: 6.9 mL/min (A) (by C-G formula based on SCr of 5.94 mg/dL (H)).    No Known Allergies  Romilda Garret, PharmD PGY1 Acute Care Pharmacy Resident 02/02/2021 7:47 AM  Please check [email or web address] for unit specific pharmacy phone numbers.

## 2021-02-02 NOTE — Progress Notes (Signed)
Palliative:  Chart reviewed. Met with patient - he was unable to discuss goals of care independently, easily confused and not completely oriented. He does tells me that his daughter Maree Erie helps him make medical decisions. Called [name], no answer. Voicemail left with call back number. Will ask teammate to follow up with Mr. Bernasconi and [facility] tomorrow.  Juel Burrow, DNP, AGNP-C Palliative Medicine Team Team Phone # [phone]  Pager # [phone]  NO CHARGE

## 2021-02-02 NOTE — Procedures (Signed)
Patient was seen on dialysis and the procedure was supervised.  BFR 400  Via AVF BP is  92/47.   Patient appears to be tolerating treatment well-  BP dropped early-  Adjust goal down   [facility] 02/02/2021

## 2021-02-02 NOTE — Progress Notes (Signed)
Subjective:  Seen on HD- somnolent and in pain-  BP seems to have dropped pretty quickly during HD, have decreased goal -   Objective Vital signs in last 24 hours: Vitals:   02/02/21 0810 02/02/21 0825 02/02/21 0840 02/02/21 0855  BP: (!) 161/63 (!) 108/56 (!) 80/51 (!) 80/47  Pulse:      Resp: '14 14 13 16  '$ Temp:      TempSrc:      SpO2:      Weight:      Height:       Weight change: -0.213 kg  Intake/Output Summary (Last 24 hours) at 02/02/2021 0903 Last data filed at 02/02/2021 [id number] Gross per 24 hour  Intake 2275 ml  Output 410 ml  Net 1865 ml   Dialyzes at [facility] MWF 4 hours- compliant EDW 43.but leaving above profile 4  HD Bath 2/2, Dialyzer 160, Heparin no. Access AVF. hect 2, sensipar 60, mircera 100  Last hgb 10.5, k 4, calc 8.9, phos 3.1, pth 163   Assessment/Plan: [name] with multiple medical issues including ESRD-  Now admitted for axillary bifem vascular graft removal due to infection  1 infected vascular graft-  S/p removal per VVS , on cefepime and vanc-  Pain control per primary  2 ESRD: normally MWF via AVF.  Doing today off schedule 3 Hypertension: was both hypo and hypertensive post op-  Using PRNs for high BP-  UF as able with HD- will need to decrease goal but should be OK  4. Anemia of ESRD: required transfusion post op-  Latest hgb 8.7-  Continue ESA 5. Metabolic Bone Disease:  continue hectorol and sensipar- also on renvela  But not really eating much at this time    [place]: Basic Metabolic Panel: Recent Labs  Lab 02/01/21 0730 02/01/21 1353 02/01/21 2154 02/02/21 0225  NA 136 138  --  136  K 5.1 4.7  --  4.6  CL 97*  --   --  100  CO2 28  --   --  24  GLUCOSE 71  --   --  124*  BUN 26*  --   --  29*  CREATININE 5.35*  --  5.68* 5.94*  CALCIUM 8.8*  --   --  8.8*   Liver Function Tests: Recent Labs  Lab 02/01/21 0730 02/02/21 0225  AST 22 18  ALT 15 9  ALKPHOS 110 81  BILITOT 0.9 1.1  PROT 6.3* 5.8*   ALBUMIN 2.6* 2.7*   No results for input(s): LIPASE, AMYLASE in the last 168 hours. No results for input(s): AMMONIA in the last 168 hours. CBC: Recent Labs  Lab 02/01/21 0730 02/01/21 1353 02/01/21 2154 02/02/21 0225  WBC 4.6  --  8.6 7.1  HGB 10.9* 8.2* 9.2* 8.7*  HCT 34.5* 24.0* 27.6* 26.4*  MCV 92.7  --  88.2 88.9  PLT 150  --  166 155   Cardiac Enzymes: No results for input(s): CKTOTAL, CKMB, CKMBINDEX, TROPONINI in the last 168 hours. CBG: Recent Labs  Lab 02/01/21 0724 02/01/21 0933 02/01/21 1608 02/01/21 1723 02/01/21 1838  GLUCAP 75 71 57* 71 155*    Iron Studies: No results for input(s): IRON, TIBC, TRANSFERRIN, FERRITIN in the last 72 hours. Studies/Results: DG CHEST PORT 1 VIEW  Result Date: 02/01/2021 CLINICAL DATA:  Shortness of breath EXAM: PORTABLE CHEST 1 VIEW COMPARISON:  07/09/2020 FINDINGS: Left upper extremity vascular stent. No focal opacity or pleural  effusion. Stable cardiomediastinal silhouette with aortic atherosclerosis. No pneumothorax. IMPRESSION: No active disease. Electronically Signed   By: Donavan Foil M.D.   On: 02/01/2021 19:40   Medications: Infusions:  sodium chloride     ceFEPime (MAXIPIME) IV     vancomycin     vancomycin      Scheduled Medications:  aspirin EC  81 mg Oral Daily   atorvastatin  80 mg Oral QHS   Chlorhexidine Gluconate Cloth  6 each Topical Once   And   Chlorhexidine Gluconate Cloth  6 each Topical Once   Chlorhexidine Gluconate Cloth  6 each Topical Q0600   cinacalcet  60 mg Oral Q T,Th,Sa-HD   clopidogrel  75 mg Oral Daily   [START ON 02/04/2021] darbepoetin (ARANESP) injection - DIALYSIS  60 mcg Intravenous Q Mon-HD   docusate sodium  200 mg Oral QHS   doxercalciferol  2 mcg Intravenous Q T,Th,Sa-HD   gabapentin  100 mg Oral 2 times per day   gabapentin  300 mg Oral QHS   heparin  5,000 Units Subcutaneous Q8H   icosapent Ethyl  1 g Oral BID   isosorbide mononitrate  30 mg Oral  Daily   metoprolol tartrate  12.5 mg Oral BID   mirtazapine  7.5 mg Oral QHS   multivitamin  1 tablet Oral QHS   sevelamer carbonate  1,600 mg Oral TID WC   sodium chloride flush  3 mL Intravenous Q12H   traZODone  50 mg Oral QHS   umeclidinium bromide  1 puff Inhalation Daily    have reviewed scheduled and prn medications.  Physical Exam: General: somnolent but arousable Heart: RRR Lungs: mostly clear-  Sat 99% Abdomen: soft, non tender Extremities: no edema-  Dry ischemic appearing limbs Dialysis Access: left AVF patent     02/02/2021,9:03 AM  LOS: 1 day

## 2021-02-03 ENCOUNTER — Inpatient Hospital Stay (HOSPITAL_COMMUNITY): Payer: Medicare Other

## 2021-02-03 DIAGNOSIS — T827XXA Infection and inflammatory reaction due to other cardiac and vascular devices, implants and grafts, initial encounter: Secondary | ICD-10-CM | POA: Diagnosis not present

## 2021-02-03 DIAGNOSIS — I714 Abdominal aortic aneurysm, without rupture: Secondary | ICD-10-CM | POA: Diagnosis not present

## 2021-02-03 DIAGNOSIS — Z7189 Other specified counseling: Secondary | ICD-10-CM

## 2021-02-03 DIAGNOSIS — I999 Unspecified disorder of circulatory system: Secondary | ICD-10-CM | POA: Diagnosis not present

## 2021-02-03 DIAGNOSIS — N186 End stage renal disease: Secondary | ICD-10-CM | POA: Diagnosis not present

## 2021-02-03 DIAGNOSIS — I10 Essential (primary) hypertension: Secondary | ICD-10-CM | POA: Diagnosis not present

## 2021-02-03 DIAGNOSIS — Z515 Encounter for palliative care: Secondary | ICD-10-CM

## 2021-02-03 DIAGNOSIS — Z66 Do not resuscitate: Secondary | ICD-10-CM

## 2021-02-03 LAB — CBC
HCT: 21.9 % — ABNORMAL LOW (ref 39.0–52.0)
Hemoglobin: 7.3 g/dL — ABNORMAL LOW (ref 13.0–17.0)
MCH: 30 pg (ref 26.0–34.0)
MCHC: 33.3 g/dL (ref 30.0–36.0)
MCV: 90.1 fL (ref 80.0–100.0)
Platelets: 138 10*3/uL — ABNORMAL LOW (ref 150–400)
RBC: 2.43 MIL/uL — ABNORMAL LOW (ref 4.22–5.81)
RDW: 14.4 % (ref 11.5–15.5)
WBC: 6.5 10*3/uL (ref 4.0–10.5)
nRBC: 0 % (ref 0.0–0.2)

## 2021-02-03 MED ORDER — TRAMADOL HCL 50 MG PO TABS
100.0000 mg | ORAL_TABLET | Freq: Two times a day (BID) | ORAL | Status: DC
  Administered 2021-02-03: 100 mg via ORAL
  Filled 2021-02-03: qty 2

## 2021-02-03 MED ORDER — POLYVINYL ALCOHOL 1.4 % OP SOLN
1.0000 [drp] | Freq: Four times a day (QID) | OPHTHALMIC | Status: DC | PRN
  Filled 2021-02-03: qty 15

## 2021-02-03 MED ORDER — DOCUSATE SODIUM 100 MG PO CAPS: 200.0000 mg | ORAL_CAPSULE | Freq: Two times a day (BID) | ORAL | Status: DC | PRN

## 2021-02-03 MED ORDER — HALOPERIDOL LACTATE 5 MG/ML IJ SOLN: 0.5000 mg | INTRAMUSCULAR | Status: DC | PRN

## 2021-02-03 MED ORDER — HYDROMORPHONE HCL 1 MG/ML IJ SOLN
1.0000 mg | INTRAMUSCULAR | Status: DC | PRN
  Administered 2021-02-03: 1 mg via INTRAVENOUS
  Filled 2021-02-03: qty 1

## 2021-02-03 MED ORDER — GLYCOPYRROLATE 0.2 MG/ML IJ SOLN: 0.2000 mg | INTRAMUSCULAR | Status: DC | PRN

## 2021-02-03 MED ORDER — LORAZEPAM 2 MG/ML IJ SOLN
1.0000 mg | INTRAMUSCULAR | Status: DC | PRN
  Administered 2021-02-03: 1 mg via INTRAVENOUS
  Filled 2021-02-03: qty 1

## 2021-02-03 MED ORDER — CHLORHEXIDINE GLUCONATE CLOTH 2 % EX PADS
6.0000 | MEDICATED_PAD | Freq: Every day | CUTANEOUS | Status: DC
  Administered 2021-02-03: 6 via TOPICAL

## 2021-02-03 MED ORDER — BIOTENE DRY MOUTH MT LIQD: 15.0000 mL | OROMUCOSAL | Status: DC | PRN

## 2021-02-03 MED ORDER — HYDROMORPHONE HCL 1 MG/ML IJ SOLN
1.0000 mg | INTRAMUSCULAR | Status: DC | PRN
  Administered 2021-02-03 – 2021-02-05 (×3): 1 mg via INTRAVENOUS
  Filled 2021-02-03 (×3): qty 1

## 2021-02-03 NOTE — Progress Notes (Signed)
Subjective:  Completed HD yesterday- UF only 600 due to low BPs and likely did not have much volume on.  In pain-  Sister at bedside-  She seems to understand the situation     Objective Vital signs in last 24 hours: Vitals:   02/03/21 0041 02/03/21 0617 02/03/21 0800 02/03/21 0813  BP: (!) 122/49 (!) 133/54  136/64  Pulse: 71 74  68  Resp: '19 18 16 16  '$ Temp: 98.2 F (36.8 C) 98.4 F (36.9 C)  98.2 F (36.8 C)  TempSrc: Oral Oral  Oral  SpO2: 94% 97%  99%  Weight:      Height:       Weight change: -0.3 kg  Intake/Output Summary (Last 24 hours) at 02/03/2021 [id number] Last data filed at 02/02/2021 1130 Gross per 24 hour  Intake --  Output 600 ml  Net -600 ml   Dialyzes at [place] MWF 4 hours- compliant EDW 43.but leaving above profile 4  HD Bath 2/2, Dialyzer 160, Heparin no. Access AVF. hect 2, sensipar 60, mircera 100  Last hgb 10.5, k 4, calc 8.9, phos 3.1, pth 163   Assessment/Plan: [name] with multiple medical issues including ESRD-  Now admitted for axillary bifem vascular graft removal due to infection  1 infected vascular graft-  S/p removal per VVS , on cefepime and vanc-  Pain control per primary.  Thought is that this will be a terminal situation as he does not have any options for revascularization-  Palliative care attempting to meet with family to discuss goals of care  2 ESRD: normally MWF via AVF.  Next would be Monday- will write orders but tell them to do second shift as not sure what overall dispo will be  3 Hypertension: was both hypo and hypertensive post op-  Using PRNs for high BP-  UF as able with HD-  4. Anemia of ESRD: required transfusion post op-  Latest hgb lower at 7.3-  Continue ESA-  If still continuing aggressive care after palliative care meeting can transfuse tomorrow in HD if needed  5. Metabolic Bone Disease:  continue hectorol and sensipar- also on renvela normally but not really eating much at this time - will hold   Trevor [name]    Labs: Basic Metabolic Panel: Recent Labs  Lab 02/01/21 0730 02/01/21 1353 02/01/21 2154 02/02/21 0225  NA 136 138  --  136  K 5.1 4.7  --  4.6  CL 97*  --   --  100  CO2 28  --   --  24  GLUCOSE 71  --   --  124*  BUN 26*  --   --  29*  CREATININE 5.35*  --  5.68* 5.94*  CALCIUM 8.8*  --   --  8.8*   Liver Function Tests: Recent Labs  Lab 02/01/21 0730 02/02/21 0225  AST 22 18  ALT 15 9  ALKPHOS 110 81  BILITOT 0.9 1.1  PROT 6.3* 5.8*  ALBUMIN 2.6* 2.7*   No results for input(s): LIPASE, AMYLASE in the last 168 hours. No results for input(s): AMMONIA in the last 168 hours. CBC: Recent Labs  Lab 02/01/21 0730 02/01/21 1353 02/01/21 2154 02/02/21 0225 02/03/21 0237  WBC 4.6  --  8.6 7.1 6.5  HGB 10.9*   < > 9.2* 8.7* 7.3*  HCT 34.5*   < > 27.6* 26.4* 21.9*  MCV 92.7  --  88.2 88.9 90.1  PLT 150  --  166 155 138*   < > = values in this interval not displayed.   Cardiac Enzymes: No results for input(s): CKTOTAL, CKMB, CKMBINDEX, TROPONINI in the last 168 hours. CBG: Recent Labs  Lab 02/01/21 0933 02/01/21 1608 02/01/21 1723 02/01/21 1838 02/02/21 1357  GLUCAP 71 57* 71 155* 99    Iron Studies: No results for input(s): IRON, TIBC, TRANSFERRIN, FERRITIN in the last 72 hours. Studies/Results: DG CHEST PORT 1 VIEW  Result Date: 02/01/2021 CLINICAL DATA:  Shortness of breath EXAM: PORTABLE CHEST 1 VIEW COMPARISON:  07/09/2020 FINDINGS: Left upper extremity vascular stent. No focal opacity or pleural effusion. Stable cardiomediastinal silhouette with aortic atherosclerosis. No pneumothorax. IMPRESSION: No active disease. Electronically Signed   By: Donavan Foil M.D.   On: 02/01/2021 19:40   Medications: Infusions: . sodium chloride      Scheduled Medications: . acetaminophen  650 mg Oral Q6H  . aspirin EC  81 mg Oral Daily  . atorvastatin  80 mg Oral QHS  . Chlorhexidine Gluconate Cloth  6 each Topical Once   And  . Chlorhexidine  Gluconate Cloth  6 each Topical Once  . Chlorhexidine Gluconate Cloth  6 each Topical Q0600  . cinacalcet  60 mg Oral Q T,Th,Sa-HD  . clopidogrel  75 mg Oral Daily  . [START ON 02/04/2021] darbepoetin (ARANESP) injection - DIALYSIS  60 mcg Intravenous Q Mon-HD  . docusate sodium  200 mg Oral QHS  . doxercalciferol  2 mcg Intravenous Q T,Th,Sa-HD  . gabapentin  100 mg Oral 2 times per day  . gabapentin  300 mg Oral QHS  . heparin  5,000 Units Subcutaneous Q8H  . icosapent Ethyl  1 g Oral BID  . isosorbide mononitrate  30 mg Oral Daily  . metoprolol tartrate  12.5 mg Oral BID  . mirtazapine  7.5 mg Oral QHS  . multivitamin  1 tablet Oral QHS  . sevelamer carbonate  1,600 mg Oral TID WC  . sodium chloride flush  3 mL Intravenous Q12H  . traMADol  50 mg Oral Q12H  . traZODone  50 mg Oral QHS  . umeclidinium bromide  1 puff Inhalation Daily    have reviewed scheduled and prn medications.  Physical Exam: General: can get comfortable- sister here  Heart: RRR Lungs: mostly clear-  Sat 99% Abdomen: soft, non tender Extremities: no edema-  Dry ischemic appearing limbs Dialysis Access: left AVF patent     02/03/2021,8:28 AM  LOS: 2 days

## 2021-02-03 NOTE — Consult Note (Signed)
Consultation Note Date: 02/03/2021   Patient Name: Trevor [name]  DOB: 1946-03-31  MRN: [id number]  Age / Sex: 75 y.o., male   PCP: [name] Referring Physician: Mckinley Jewel, MD   REASON FOR CONSULTATION:Establishing goals of care  Palliative Care consult requested for goals of care discussion in this 75 y.o. 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 Patient presented from LTC with infected axillary bifemoral bypass with purulent drainage. He is s/p removal of graft by vascular (3/4) and started on IV antibiotics. It was determined patient is a poor candidate for palliative amputation.   Clinical Assessment and Goals of Care: I have reviewed medical records including lab results, imaging, Epic notes, and MAR, received report from the bedside RN, and assessed the patient.   I spoke with patient's family Trevor [name] (daughter), Trevor [name] (son) via conference call and his sister Trevor [name]) and brother Trevor [name]) at the bedside  to discuss diagnosis prognosis, [place], EOL wishes, disposition and options.  I introduced Palliative Medicine as specialized medical care for people living with serious illness. It focuses on providing relief from the symptoms and stress of a serious illness. The goal is to improve quality of life for both the patient and the family. Family verbalized understanding and appreciation.   We discussed a brief life review of the patient, along with his functional and nutritional status. Family shares patient served in the [employer] and retired from [employer]. He has 2 children (Trevor [name] and Trevor [name]). He has 8 surviving siblings out of 33. He enjoys sports and spending time with family.   Prior  to admission patient had been a long-term resident at [facility] facility due to multiple health problems. He has been wheelchair bound since amputation.   We discussed His current illness and what it means in the larger context of His on-going co-morbidities. Natural disease trajectory and expectations at EOL were discussed.  A detailed discussion was had today regarding advanced directives.  Concepts specific to code status, artifical feeding and hydration, continued IV antibiotics and rehospitalization. The difference between a aggressive medical intervention and a palliative comfort care path were discussed at length. Values and goals of care important to patient and family were attempted to be elicited.   Family verbalized understanding of patient's poor prognosis. Trevor [name] shares although he does not want to lose his father he does not wish to see him continue to struggle. Family verbalized mutual agreement. Family expressed wishes for no intense medical interventions such as surgery.   I created an opportunity to discuss in detail patient's current full code status, dialysis, and continued treatments (blood transfusions) and medications not focused on comfort.   Family has mutually requested to discontinue dialysis, transfusions, and allow patient to spend what time he has left comfortable with aggressive symptom management with hopes he is not in a painful or suffering state. Confirms wishes for DNR/DNI.    Hospice services outpatient were explained and offered given family's expressed wishes for care to focus on comfort only. Family verbalized their understanding and awareness of hospice's goals and philosophy of care. We discussed hospice options in the home with family support, at facility, or residential hospice facility. Family reports their preference would be [facility] if possible given their  familiarity with their services and attentiveness of the needs for patient at end-of-life. I  confirmed family's wishes and education provided on the referral process.   Questions and concerns were addressed.  The family was encouraged to call with questions or concerns.  PMT will continue to support holistically as needed.   CODE STATUS: DNR  ADVANCE DIRECTIVES: Primary Decision Maker: Children with support of aunts   SYMPTOM MANAGEMENT:see below   Palliative Prophylaxis:   Aspiration, Bowel Regimen, Delirium Protocol, Eye Care, Frequent Pain Assessment, Oral Care, Palliative Wound Care and Turn Reposition  PSYCHO-SOCIAL/SPIRITUAL:  Support System: Family  Desire for further Chaplaincy support:No   Additional Recommendations (Limitations, Scope, Preferences):  Full Comfort Care, No Artificial Feeding, No Blood Transfusions, No Hemodialysis and DNR/DNI   Education on hospice    PAST MEDICAL HISTORY: Past Medical History:  Diagnosis Date  . AAA (abdominal aortic aneurysm) ([place])   . Anemia   . Anxiety   . Arthritis    arms and back  . Blindness   . CHF (congestive heart failure) ([place])   . Coronary artery disease    mild   . 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 Diabetes mellitus without complication ([place])   . ESRD on hemodialysis ([place])    started HD 2014-15  . Headache(784.0)   . HTN (hypertension)   . Protein-calorie malnutrition ([place])   . STEMI (ST elevation myocardial infarction) ([place]) 11/11/2018  . Stroke ([place]) 01/2018   no residual deficits    ALLERGIES:  has No Known Allergies.   MEDICATIONS:  Current Facility-Administered Medications  Medication Dose Route Frequency Provider Last Rate Last Admin  . 0.9 %  sodium chloride infusion  250 mL Intravenous PRN Bonnell Public Tublu, MD      . acetaminophen (TYLENOL) tablet 650 mg  650 mg Oral Q6H PRN Vashti Hey, MD       Or  . acetaminophen (TYLENOL) suppository 650 mg  650 mg Rectal Q6H PRN Vashti Hey, MD      .  acetaminophen (TYLENOL) tablet 650 mg  650 mg Oral Q6H Cherre Robins, MD   650 mg at 02/03/21 0818  . aspirin EC tablet 81 mg  81 mg Oral Daily Bonnell Public Tublu, MD      . atorvastatin (LIPITOR) tablet 80 mg  80 mg Oral QHS Bonnell Public Tublu, MD   80 mg at 02/02/21 2141  . Chlorhexidine Gluconate Cloth 2 % PADS 6 each  6 each Topical Once Vashti Hey, MD       And  . Chlorhexidine Gluconate Cloth 2 % PADS 6 each  6 each Topical Once Bonnell Public Tublu, MD      . Chlorhexidine Gluconate Cloth 2 % PADS 6 each  6 each Topical Q0600 Corliss Parish, MD      . Chlorhexidine Gluconate Cloth 2 % PADS 6 each  6 each Topical Q0600 Corliss Parish, MD   6 each at 02/03/21 [phone]  . cinacalcet (SENSIPAR) tablet 60 mg  60 mg Oral Q T,Th,Sa-HD Corliss Parish, MD   60 mg at 02/02/21 1303  . clopidogrel (PLAVIX) tablet 75 mg  75 mg Oral Daily Bonnell Public Tublu, MD   75 mg at 02/02/21 1304  . [START ON 02/04/2021] Darbepoetin Alfa (ARANESP) injection 60 mcg  60 mcg Intravenous Q Mon-HD Corliss Parish, MD      . docusate sodium (COLACE) capsule 200 mg  200 mg Oral QHS Bonnell Public Tublu, MD   200 mg at 02/02/21 2141  . doxercalciferol (HECTOROL) injection 2 mcg  2 mcg Intravenous Q T,Th,Sa-HD Corliss Parish, MD   2 mcg at 02/02/21 [phone]  . gabapentin (NEURONTIN) capsule 100 mg  100 mg Oral 2 times per day Vashti Hey, MD   100 mg at 02/03/21 0818  . gabapentin (NEURONTIN) capsule 300 mg  300 mg Oral QHS Bonnell Public Tublu, MD   300 mg at 02/02/21 2141  . heparin injection 5,000 Units  5,000 Units Subcutaneous Q8H Bonnell Public Tublu, MD   5,000 Units at 02/03/21 [id number]  . hydrALAZINE (APRESOLINE) injection 10 mg  10 mg Intravenous Q4H PRN Vashti Hey, MD   10 mg at 02/02/21 0216  . HYDROmorphone (DILAUDID) injection 1 mg  1 mg Intravenous Q4H PRN Pahwani, Rinka R, MD   1 mg at 02/03/21 1248  .  icosapent Ethyl (VASCEPA) 1 g capsule 1 g  1 g Oral BID Bonnell Public Tublu, MD   1 g at 02/03/21 0820  . ipratropium-albuterol (DUONEB) 0.5-2.5 (3) MG/3ML nebulizer solution 3 mL  3 mL Nebulization Q6H PRN Bonnell Public Tublu, MD      . isosorbide mononitrate (IMDUR) 24 hr tablet 30 mg  30 mg Oral Daily Bonnell Public Tublu, MD   30 mg at 02/03/21 0824  . metoprolol tartrate (LOPRESSOR) tablet 12.5 mg  12.5 mg Oral BID Bonnell Public Tublu, MD   12.5 mg at 02/03/21 0818  . mirtazapine (REMERON) tablet 7.5 mg  7.5 mg Oral QHS Bonnell Public Tublu, MD   7.5 mg at 02/02/21 2143  . multivitamin (RENA-VIT) tablet 1 tablet  1 tablet Oral QHS Vashti Hey, MD   1 tablet at 02/02/21 2143  . nitroGLYCERIN (NITROSTAT) SL tablet 0.4 mg  0.4 mg Sublingual Q5 Min x 3 PRN Bonnell Public Tublu, MD      . ondansetron [facility]) tablet 4 mg  4 mg Oral Q6H PRN Bonnell Public Tublu, MD      . oxyCODONE (Oxy IR/ROXICODONE) immediate release tablet 5-10 mg  5-10 mg Oral Q4H PRN Cherre Robins, MD   10 mg at 02/03/21 0350  . polyethylene glycol (MIRALAX / GLYCOLAX) packet 17 g  17 g Oral Daily PRN Bonnell Public Tublu, MD      . sodium chloride flush (NS) 0.9 % injection 3 mL  3 mL Intravenous Q12H Bonnell Public Tublu, MD   3 mL at 02/03/21 0820  . sodium chloride flush (NS) 0.9 % injection 3 mL  3 mL Intravenous PRN Bonnell Public Tublu, MD      . traMADol Veatrice [facility]bon) tablet 100 mg  100 mg Oral Q12H Pahwani, Rinka R, MD      . traZODone (DESYREL) tablet 50 mg  50 mg Oral QHS Bonnell Public Tublu, MD   50 mg at 02/02/21 2144  . umeclidinium bromide (INCRUSE ELLIPTA) 62.5 MCG/INH 1 puff  1 puff Inhalation Daily Bonnell Public Tublu, MD   1 puff at 02/03/21 0800    VITAL SIGNS: BP (!) 172/64 (BP Location: Right Arm) Comment: RN notified  Pulse 75   Temp 98.6 F (37 C) (Oral)   Resp 20   Ht '5\' 5"'$  (1.651 m)   Wt 44.5 kg   SpO2 97%   BMI 16.33  kg/m  Filed Weights   02/01/21 2200 02/02/21 0750 02/02/21 1130  Weight: 45.6 kg 45.3 kg 44.5 kg    Estimated body mass index  is 16.33 kg/m as calculated from the following:   Height as of this encounter: '5\' 5"'$  (1.651 m).   Weight as of this encounter: 44.5 kg.  LABS: CBC:    Component Value Date/Time   WBC 6.5 02/03/2021 0237   HGB 7.3 (L) 02/03/2021 0237   HCT 21.9 (L) 02/03/2021 0237   PLT 138 (L) 02/03/2021 0237   Comprehensive Metabolic Panel:    Component Value Date/Time   NA 136 02/02/2021 0225   K 4.6 02/02/2021 0225   BUN 29 (H) 02/02/2021 0225   CREATININE 5.94 (H) 02/02/2021 0225   ALBUMIN 2.7 (L) 02/02/2021 0225     Review of Systems  Unable to perform ROS: Acuity of condition    Physical Exam General: NAD, frail chronically-ill appearing Cardiovascular: regular rate and rhythm Pulmonary: diminished bilaterally  Neurological: alert and oriented with some confusion, L AKA   Prognosis: Poor- in the setting of discontinuation of dialysis, LAKA, deconditioned, COPD, a-fib, ESRD, anemia, pain, and severe end-stage PVD with gangrene (not a surgical candidate)   Discharge Planning:  Hospice facility  Recommendations: . DNR/DNI-as requested and confirmed by family (children and sister) . Transition care to focus on comfort path/EOL . Discussed at length with family. Family mutually verbalized understanding of Mr. Bennink poor prognosis. They wish for him to be at peace and comfortable during what time he has left. Requesting residential hospice placement with preference expressed for [facility]. Family requesting Trevor [name] to be primary contact and Darlene as back-up if Trevor [name] is unavailable.  [facility] Residential hospice per family request (TOC order to assist) Diluadid PRN for pain/air hunger/comfort Robinul PRN for excessive secretions Ativan PRN for agitation/anxiety Zofran PRN for nausea Liquifilm tears PRN for dry eyes Haldol PRN for agitation/anxiety May have  comfort feeding Comfort cart for family Unrestricted visitations in the setting of EOL (per policy) Oxygen PRN 2L or less for comfort. No escalation.  Marland Kitchen PMT will continue to support and follow as needed. Please call team line with urgent needs.   Palliative Performance Scale: PPS 20%               Family expressed understanding and was in agreement with this plan.   Thank you for allowing the Palliative Medicine Team to assist in the care of this patient. Please utilize secure chat with additional questions, if there is no response within 30 minutes please call the above phone number.   Time In: 1330 Time Out: 1445 Time Total: 75 min.   Visit consisted of counseling and education dealing with the complex and emotionally intense issues of symptom management and palliative care in the setting of serious and potentially life-threatening illness.Greater than 50%  of this time was spent counseling and coordinating care related to the above assessment and plan.  Signed by:  Alda Lea, AGPCNP-BC Palliative Medicine Team  Phone: [phone] Pager: [phone] Amion: [place] Team providers are available by phone from 7am to 7pm daily and can be reached through the team cell phone.  Should this patient require assistance outside of these hours, please call the patient's attending physician.

## 2021-02-03 NOTE — Progress Notes (Signed)
Mobility Specialist: Progress Note   02/03/21 1556  Mobility  Activity Contraindicated/medical hold   Instructed by RN to not see pt today. Will f/u tomorrow.   [facility] Trevor [name] Mobility Specialist Mobility Specialist Phone: [phone]

## 2021-02-03 NOTE — Progress Notes (Addendum)
  Progress Note    02/03/2021 9:13 AM 2 Days Post-Op  Subjective:  Complaining of RLE pain   Vitals:   02/03/21 0800 02/03/21 0813  BP:  136/64  Pulse:  68  Resp: 16 16  Temp:  98.2 F (36.8 C)  SpO2:  99%   Physical Exam: Lungs:  Non labored Incisions:  Chest and groin incisions c/d/i Extremities:  R foot cool to touch; pain with palpation of R hip; RLE contracted and internally rotated Neurologic: A&O  CBC    Component Value Date/Time   WBC 6.5 02/03/2021 0237   RBC 2.43 (L) 02/03/2021 0237   HGB 7.3 (L) 02/03/2021 0237   HCT 21.9 (L) 02/03/2021 0237   PLT 138 (L) 02/03/2021 0237   MCV 90.1 02/03/2021 0237   MCH 30.0 02/03/2021 0237   MCHC 33.3 02/03/2021 0237   RDW 14.4 02/03/2021 0237   LYMPHSABS 1.0 07/09/2020 1157   MONOABS 0.4 07/09/2020 1157   EOSABS 0.2 07/09/2020 1157   BASOSABS 0.1 07/09/2020 1157    BMET    Component Value Date/Time   NA 136 02/02/2021 0225   K 4.6 02/02/2021 0225   CL 100 02/02/2021 0225   CO2 24 02/02/2021 0225   GLUCOSE 124 (H) 02/02/2021 0225   BUN 29 (H) 02/02/2021 0225   CREATININE 5.94 (H) 02/02/2021 0225   CALCIUM 8.8 (L) 02/02/2021 0225   GFRNONAA 9 (L) 02/02/2021 0225   GFRAA 8 (L) 07/09/2020 1157    INR    Component Value Date/Time   INR 1.2 02/01/2021 0730     Intake/Output Summary (Last 24 hours) at 02/03/2021 0913 Last data filed at 02/02/2021 1130 Gross per 24 hour  Intake --  Output 600 ml  Net -600 ml     Assessment/Plan:  75 y.o. male is s/p removal of infected ax-bifemoral bypass 2 Days Post-Op   No options for revascularization of RLE Pain in area of R hip; leg is contracted and internally rotated; check XR hip/pelvis HD per Nephrology Palliative meeting with family today to discuss goals of care   Dagoberto Ligas, PA-C [facility] [phone] 02/03/2021 9:13 AM   VASCULAR STAFF ADDENDUM: I have independently interviewed and examined the patient. I agree with the  above.  Met with IM and family at bedside. Patient quite uncomfortable in RLE. Unfortunately, we have no options for revascularization. Palliative amputation is an option, but does carry some risk. It seems family is leaning towards comfort measures only. I think this is totally appropriate. We will continue to follow closely.   Yevonne Aline. Stanford Breed, MD [facility] Phone Number: [phone] 02/03/2021 10:03 AM

## 2021-02-03 NOTE — Progress Notes (Signed)
PROGRESS NOTE    Trevor [name]  [id number] DOB: 12/13/1945 DOA: 02/01/2021 PCP: [name]   Brief Narrative:  Trevor [name] is an 75 y.o. 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  Patient was noted to have an infected graft with purulent drainage.    ED Course:  The patient was noted to be hypotensive despite use of crystalloid in the postop period.  Patient had been requiring Neo-Synephrine IV.  H&H had decreased by 2 points and he was transfused 1 unit PRBC which he tolerated without evidence of fluid overload.  Patient was able to come off Neo-Synephrine and subsequently became very hypertensive requiring as needed hydralazine.  Patient is now stable to come to the progressive unit status post transfusion with reasonable blood pressure and no evidence of fluid overload.  Patient had been seen by nephrology who noted no emergent need for dialysis at present.  He is status post excision of infected axillary bifemoral bypass on 3/4 by vascular surgery.  Assessment & Plan:  Infected right axillary bifemoral graft: -Status post removal of infected graft on 3/4 by vascular surgery -Patient was placed on Vanco and cefepime -Vascular surgery on board-thinks poor prognosis overall, no options for RLE revascularization however palliative amputation is an option does carry some risks and recommend palliative care consultation for comfort measures. -discontinue IV antibiotics -Continue as needed pain medication-increased dose of Dilaudid and tramadol. -Family is leaning towards  comfort measures. -Consult palliative care  Severe end-stage peripheral vascular disease with possible gangrene: -Not a candidate for RLE revascularization as per vascular surgery -Continue aspirin, Plavix -Continue Neurontin, as needed pain medications.  Hypertensive urgency: -Patient was initially hypotensive thought to be secondary to intraoperative blood loss.  Status post 1 unit blood transfusion -Blood pressure improved after hemodialysis.  Acute on chronic anemia with intraoperative blood loss: -Hemoglobin dropped by two-point yesterday.  S/p 1 unit PRBC transfusion on 3/4. -Hemoglobin 7.3 this morning-will transfuse him during hemodialysis tomorrow to avoid fluid overload. Hold heparin -Monitor H&H closely and transfuse if hemoglobin less than 7.  ESRD on hemodialysis: -Appreciate nephrology's help with the dialysis.  Chronic combined systolic and diastolic CHF: -Patient appears euvolemic on exam.  He received IV fluid and blood yesterday postoperatively. -Strict INO's and daily weight. -Monitor electrolytes.  Paroxysmal A. fib: Rate controlled -Continue metoprolol.  COPD with ongoing tobacco abuse: -On room air.  No wheezing noted on exam. -Continue home inhalers  Coronary artery disease: No ACS symptoms -Continue Imdur, aspirin, Plavix, statin, metoprolol  Anxiety and depression: -Continue mirtazapine and trazodone  Goals of care: Overall poor prognosis in the setting of multiple chronic medical issues.  Consulted palliative care.    I had long discussion with family (patient's sister at bedside and patient's daughter over the phone)-discussed overall poor prognosis-family understands and they are leaning towards comfort measures.   DVT prophylaxis: SCD Code Status: Full code-confirmed with patient's daughter Family Communication: None present at bedside.  Plan of care discussed with patient in length and he verbalized understanding and agreed with  it. Disposition Plan: To be determined  Consultants:   Vascular surgery  Palliative care  Procedures:   Status post removal of infected axillary bifemoral graft on 3/4 by vascular surgery  Antimicrobials:   Vancomycin  Cefepime  Status is: Inpatient   Dispo: The patient is from: [name]              Anticipated d/c is to: [name]              Patient currently is not medically stable to d/c.   Difficult to place patient No    Subjective: Patient seen and examined.  Sister at the bedside.  Patient tells me that he feels very weak and has terrible pain in his right leg and foot.  Remained afebrile.  Denies chest pain or shortness of breath or nausea or vomiting.  No acute events overnight.  Objective: Vitals:   02/03/21 0617 02/03/21 0800 02/03/21 0813 02/03/21 1111  BP: (!) 133/54  136/64 (!) 172/64  Pulse: 74  68 75  Resp: '18 16 16 20  '$ Temp: 98.4 F (36.9 C)  98.2 F (36.8 C) 98.6 F (37 C)  TempSrc: Oral  Oral Oral  SpO2: 97%  99% 97%  Weight:      Height:       No intake or output data in the 24 hours ending 02/03/21 1156 Filed Weights   02/01/21 2200 02/02/21 0750 02/02/21 1130  Weight: 45.6 kg 45.3 kg 44.5 kg    Examination:  General exam: Elderly male, appears sick, lethargic and weak.  On room air, respiratory system: Clear to auscultation. Respiratory effort normal. Cardiovascular system: S1 & S2 heard, RRR. No JVD, murmurs, rubs, gallops or clicks. No pedal edema. Gastrointestinal system: Abdomen is nondistended, soft and nontender. No organomegaly or masses felt. Normal bowel sounds heard. Central nervous system: Alert and oriented. No focal neurological deficits. Extremities: Status post left AKA.    Psychiatry: Judgement and insight appear normal. Mood & affect appropriate.    Data Reviewed: I have personally reviewed following labs and imaging studies  CBC: Recent Labs  Lab 02/01/21 0730 02/01/21 1353 02/01/21 2154 02/02/21 0225  02/03/21 0237  WBC 4.6  --  8.6 7.1 6.5  HGB 10.9* 8.2* 9.2* 8.7* 7.3*  HCT 34.5* 24.0* 27.6* 26.4* 21.9*  MCV 92.7  --  88.2 88.9 90.1  PLT 150  --  166 155 0000000*   Basic Metabolic Panel: Recent Labs  Lab 02/01/21 0730 02/01/21 1353 02/01/21 2154 02/02/21 0225  NA 136 138  --  136  K 5.1 4.7  --  4.6  CL 97*  --   --  100  CO2 28  --   --  24  GLUCOSE 71  --   --  124*  BUN 26*  --   --  29*  CREATININE 5.35*  --  5.68* 5.94*  CALCIUM 8.8*  --   --  8.8*  MG  --   --   --  2.1   GFR: Estimated Creatinine Clearance: 6.8 mL/min (A) (by C-G formula based on SCr of 5.94 mg/dL (H)). Liver Function Tests: Recent Labs  Lab 02/01/21 0730 02/02/21 0225  AST 22 18  ALT 15 9  ALKPHOS 110 81  BILITOT 0.9 1.1  PROT 6.3* 5.8*  ALBUMIN 2.6* 2.7*   No results for input(s): LIPASE, AMYLASE in the last 168 hours. No results for input(s): AMMONIA in the last 168 hours. Coagulation Profile: Recent Labs  Lab 02/01/21 0730  INR 1.2   Cardiac Enzymes: No results for input(s): CKTOTAL, CKMB, CKMBINDEX, TROPONINI  in the last 168 hours. BNP (last 3 results) No results for input(s): PROBNP in the last 8760 hours. HbA1C: No results for input(s): HGBA1C in the last 72 hours. CBG: Recent Labs  Lab 02/01/21 0933 02/01/21 1608 02/01/21 1723 02/01/21 1838 02/02/21 1357  GLUCAP 71 57* 71 155* 99   Lipid Profile: No results for input(s): CHOL, HDL, LDLCALC, TRIG, CHOLHDL, LDLDIRECT in the last 72 hours. Thyroid Function Tests: No results for input(s): TSH, T4TOTAL, FREET4, T3FREE, THYROIDAB in the last 72 hours. Anemia Panel: No results for input(s): VITAMINB12, FOLATE, FERRITIN, TIBC, IRON, RETICCTPCT in the last 72 hours. Sepsis Labs: No results for input(s): PROCALCITON, LATICACIDVEN in the last 168 hours.  Recent Results (from the past 240 hour(s))  SARS Coronavirus 2 by RT PCR (hospital order, performed in [facility] hospital lab) Nasopharyngeal Nasopharyngeal Swab      Status: None   Collection Time: 02/01/21  5:54 AM   Specimen: Nasopharyngeal Swab  Result Value Ref Range Status   SARS Coronavirus 2 NEGATIVE NEGATIVE Final    Comment: (NOTE) SARS-CoV-2 target nucleic acids are NOT DETECTED.  The SARS-CoV-2 RNA is generally detectable in upper and lower respiratory specimens during the acute phase of infection. The lowest concentration of SARS-CoV-2 viral copies this assay can detect is 250 copies / mL. A negative result does not preclude SARS-CoV-2 infection and should not be used as the sole basis for treatment or other patient management decisions.  A negative result may occur with improper specimen collection / handling, submission of specimen other than nasopharyngeal swab, presence of viral mutation(s) within the areas targeted by this assay, and inadequate number of viral copies (<250 copies / mL). A negative result must be combined with clinical observations, patient history, and epidemiological information.  Fact Sheet for Patients:   StrictlyIdeas.no  Fact Sheet for Healthcare Providers: BankingDealers.co.za  This test is not yet approved or  cleared by the [place] FDA and has been authorized for detection and/or diagnosis of SARS-CoV-2 by FDA under an Emergency Use Authorization (EUA).  This EUA will remain in effect (meaning this test can be used) for the duration of the COVID-19 declaration under Section 564(b)(1) of the Act, 21 U.S.C. section 360bbb-3(b)(1), unless the authorization is terminated or revoked sooner.  Performed at [facility], [place] [address] [place], [place] [address]   Aerobic/Anaerobic Culture w Gram Stain (surgical/deep wound)     Status: None (Preliminary result)   Collection Time: 02/01/21 11:30 AM   Specimen: Groin, Right; Wound  Result Value Ref Range Status   Specimen Description WOUND  Final   Special Requests RIGHT GROIN WOUND SPEC A  Final    Gram Stain NO WBC SEEN NO ORGANISMS SEEN   Final   Culture   Final    CULTURE REINCUBATED FOR BETTER GROWTH Performed at [facility], [address], [place], [place] [address]    Report Status PENDING  Incomplete      Radiology Studies: DG CHEST PORT 1 VIEW  Result Date: 02/01/2021 CLINICAL DATA:  Shortness of breath EXAM: PORTABLE CHEST 1 VIEW COMPARISON:  07/09/2020 FINDINGS: Left upper extremity vascular stent. No focal opacity or pleural effusion. Stable cardiomediastinal silhouette with aortic atherosclerosis. No pneumothorax. IMPRESSION: No active disease. Electronically Signed   By: Donavan Foil M.D.   On: 02/01/2021 19:40   DG HIP UNILAT WITH PELVIS 2-3 VIEWS RIGHT  Result Date: 02/03/2021 CLINICAL DATA:  RIGHT hip pain.  History of RIGHT groin infection.  EXAM: DG HIP (WITH OR WITHOUT PELVIS) 2-3V RIGHT COMPARISON:  10/15/2012 radiograph FINDINGS: Diffuse osteopenia is noted. No definite fracture or osteomyelitis noted. No dislocation or focal bony lesions are noted. Heavy vascular calcifications are present. IMPRESSION: Diffuse osteopenia without definite acute bony abnormality. Electronically Signed   By: Margarette Canada M.D.   On: 02/03/2021 11:22    Scheduled Meds: . acetaminophen  650 mg Oral Q6H  . aspirin EC  81 mg Oral Daily  . atorvastatin  80 mg Oral QHS  . Chlorhexidine Gluconate Cloth  6 each Topical Once   And  . Chlorhexidine Gluconate Cloth  6 each Topical Once  . Chlorhexidine Gluconate Cloth  6 each Topical Q0600  . Chlorhexidine Gluconate Cloth  6 each Topical Q0600  . cinacalcet  60 mg Oral Q T,Th,Sa-HD  . clopidogrel  75 mg Oral Daily  . [START ON 02/04/2021] darbepoetin (ARANESP) injection - DIALYSIS  60 mcg Intravenous Q Mon-HD  . docusate sodium  200 mg Oral QHS  . doxercalciferol  2 mcg Intravenous Q T,Th,Sa-HD  . gabapentin  100 mg Oral 2 times per day  . gabapentin  300 mg Oral QHS  . heparin  5,000 Units Subcutaneous Q8H  . icosapent Ethyl   1 g Oral BID  . isosorbide mononitrate  30 mg Oral Daily  . metoprolol tartrate  12.5 mg Oral BID  . mirtazapine  7.5 mg Oral QHS  . multivitamin  1 tablet Oral QHS  . sodium chloride flush  3 mL Intravenous Q12H  . traMADol  100 mg Oral Q12H  . traZODone  50 mg Oral QHS  . umeclidinium bromide  1 puff Inhalation Daily   Continuous Infusions: . sodium chloride       LOS: 2 days   Time spent: 35 minutes  Elgie Landino Loann Quill, MD [facility]  If 7PM-7AM, please contact night-coverage [email or web address] 02/03/2021, 11:56 AM

## 2021-02-04 ENCOUNTER — Encounter (HOSPITAL_COMMUNITY): Payer: Self-pay | Admitting: Vascular Surgery

## 2021-02-04 DIAGNOSIS — I999 Unspecified disorder of circulatory system: Secondary | ICD-10-CM | POA: Diagnosis not present

## 2021-02-04 DIAGNOSIS — T827XXA Infection and inflammatory reaction due to other cardiac and vascular devices, implants and grafts, initial encounter: Secondary | ICD-10-CM | POA: Diagnosis not present

## 2021-02-04 DIAGNOSIS — N186 End stage renal disease: Secondary | ICD-10-CM | POA: Diagnosis not present

## 2021-02-04 DIAGNOSIS — R0602 Shortness of breath: Secondary | ICD-10-CM

## 2021-02-04 DIAGNOSIS — I480 Paroxysmal atrial fibrillation: Secondary | ICD-10-CM | POA: Diagnosis not present

## 2021-02-04 DIAGNOSIS — I714 Abdominal aortic aneurysm, without rupture: Secondary | ICD-10-CM | POA: Diagnosis not present

## 2021-02-04 MED ORDER — HYDROMORPHONE HCL 1 MG/ML IJ SOLN
1.0000 mg | INTRAMUSCULAR | Status: DC
Start: 2021-02-04 — End: 2021-02-05
  Administered 2021-02-04 – 2021-02-05 (×5): 1 mg via INTRAVENOUS
  Filled 2021-02-04 (×5): qty 1

## 2021-02-04 NOTE — TOC Initial Note (Signed)
Transition of Care [facility]) - Initial/Assessment Note    Patient Details  Name: Trevor [name] MRN: [id number] Date of Birth: 16-Dec-1945  Transition of Care [facility]) CM/SW Contact:    Vinie Sill, [place] Phone Number: 02/04/2021, 11:20 AM  Clinical Narrative:                  CSW spoke with patient's daughter, Carlyon Shadow -she confirmed family wishes for [employer]. CSW made referral to [place], MSW, LCSW Clinical Social Worker   Expected Discharge Plan: [place] Barriers to Discharge:  (bed availability)   Patient Goals and CMS Choice        Expected Discharge Plan and Services Expected Discharge Plan: [place] In-house Referral: Clinical Social Work                                            Prior Living Arrangements/Services     Patient language and need for interpreter reviewed:: No        Need for Family Participation in Patient Care: Yes (Comment) Care giver support system in place?: Yes (comment)   Criminal Activity/Legal Involvement Pertinent to Current Situation/Hospitalization: No - Comment as needed  Activities of Daily Living Home Assistive Devices/Equipment: Wheelchair,Dentures (specify type),Eyeglasses ADL Screening (condition at time of admission) Patient's cognitive ability adequate to safely complete daily activities?: Yes Is the patient deaf or have difficulty hearing?: No Does the patient have difficulty seeing, even when wearing glasses/contacts?: Yes Does the patient have difficulty concentrating, remembering, or making decisions?: No Patient able to express need for assistance with ADLs?: Yes Does the patient have difficulty dressing or bathing?: Yes Independently performs ADLs?: Yes (appropriate for developmental age) Does the patient have difficulty walking or climbing stairs?: Yes Weakness of Legs: Right Weakness of Arms/Hands: Both  Permission Sought/Granted Permission  sought to share information with : Family Supports Permission granted to share information with : Yes, Verbal Permission Granted  Share Information with NAME: Trevor [name] or [place] granted to share info w AGENCY: Hospice  Permission granted to share info w Relationship: daugters  Permission granted to share info w Contact Information: [phone] or [phone]  Emotional Assessment           Psych Involvement: No (comment)  Admission diagnosis:  Infected prosthetic vascular graft ([place]) FG:[id number]] Vasculopathy [I99.9] Patient Active Problem List   Diagnosis Date Noted  . Pressure injury of skin 02/02/2021  . Infected prosthetic vascular graft ([place]) 02/01/2021  . Vasculopathy 02/01/2021  . Dyspnea 05/10/2019  . Anemia associated with chronic renal failure   . Abnormal CXR 02/28/2019  . Marijuana abuse 02/28/2019  . Congestive heart failure (CHF) ([place]) 02/28/2019  . Cellulitis 02/28/2019  . Sepsis ([place]) 02/28/2019  . History of ST elevation myocardial infarction (STEMI) 11/11/2018  . Pulmonary edema 09/20/2018  . Acute respiratory failure with hypoxia ([place]) 03/29/2018  . Sleep stage dysfunction 11/10/2017  . 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 Vascular device, implant, or graft complication 0000000  . Peripheral arterial disease ([place]) 10/20/2017  . Non-allergic rhinitis 10/20/2017  . 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 BPH (benign prostatic hyperplasia) 07/07/2017  . At risk for adverse drug reaction 07/02/2017  . Acute pulmonary edema ([place]) 06/27/2017  . Hypertensive urgency 01/24/2017  . 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 Ischemic chest pain ([place]) 11/01/2016  . Essential hypertension  03/08/2016  . Dyslipidemia 01/12/2016  . Chest pain 08/12/2015  . Accelerated hypertension 08/12/2015  . Carotid stenosis   . Abdominal aortic aneurysm ([place]) 03/06/2015  . Protein-calorie malnutrition, severe ([place]) 05/20/2014  . End-stage renal  disease on hemodialysis ([place]) 05/19/2014  . Anemia of renal disease 05/19/2014  . Coronary artery disease, non-occlusive:  09/28/2012  . Secondary hyperparathyroidism ([place]) 09/27/2012  . Non compliance with medical treatment 09/27/2012  . Acute on chronic combined systolic and diastolic CHF (congestive heart failure) ([place]) 10/10/2011  . A-fib ([place]) 10/08/2011  . History of CVA (cerebrovascular accident) 10/07/2011  . Cocaine abuse ([place]) 10/07/2011  . Tobacco abuse 08/07/2011  . Bruit 08/07/2011   PCP:  Clinic, [place]:  No Pharmacies Listed    Social Determinants of Health (SDOH) Interventions    Readmission Risk Interventions No flowsheet data found.

## 2021-02-04 NOTE — Progress Notes (Signed)
° °  Daily Progress Note   Patient Name: Trevor [name]       Date: 02/04/2021 DOB: 11-17-1946  Age: 75 y.o. MRN#: [id number] Attending Physician: Mckinley Jewel, MD Primary Care Physician: [name] Admit Date: 02/01/2021  Reason for Consultation/Follow-up: Establishing goals of care, Non pain symptom management, Pain control, Psychosocial/spiritual support and Terminal Care  Subjective: Chart Reviewed. Updates received. Patient unresponsive. Appears comfortable, no distress. Sister, Trevor [name] at the bedside.   Updates provided. Education provided regarding pain medication and symptom management as family concerned with patient moaning and inability to express pain to nursing staff. Discussed scheduled medication with a goal for comfort in addition to nursing assessing patient's comfort prior to transporting to [facility] once it is time. Family verbalized understanding and appreciation.   All questions answered and support provided.   Length of Stay: 3 days  Vital Signs: BP (!) 159/97 (BP Location: Right Arm)    Pulse 93    Temp 97.7 F (36.5 C) (Oral)    Resp 18    Ht '5\' 5"'$  (1.651 m)    Wt 44.5 kg    SpO2 90%    BMI 16.33 kg/m  SpO2: SpO2: 90 % O2 Device: O2 Device: Nasal Cannula O2 Flow Rate: O2 Flow Rate (L/min): 2 L/min  Physical Exam: Somnolent, no acute distress Mouth breathing          Palliative Care Assessment & Plan  HPI: Palliative Care consult requested for goals of care discussion in this 75 y.o. male with multiple medical problems including advanced vasculopathy including PVD status post leftAKA and right axillary bifemoral bypass, AAA, CAD status post NSTEMI with subsequent systolic and diastolic heart failure, ESRD on HD (M,W,F), accelerated hypertension, atrial fibrillation, history of cocaine use, and wheelchair bound. Patient presented from LTC with infected axillary bifemoral bypass with purulent drainage. He is s/p removal of graft by vascular (3/4) and  started on IV antibiotics. It was determined patient is a poor candidate for palliative amputation.   Code Status:  DNR  Goals of Care/Recommendations:  Continue comfort measures  Scheduled Dilaudid for pain management  Discontinue oral medications (patient unable to safely take)   Pending discharge to [place] to medicate prior to discharge for comfort/symptom management  Educated family on symptom management  PMT will continue to support and follow as needed.   Prognosis: days-weeks  Discharge Planning: Hospice facility  Thank you for allowing the Palliative Medicine Team to assist in the care of this patient.  Time Total: 40 min.   Visit consisted of counseling and education dealing with the complex and emotionally intense issues of symptom management and palliative care in the setting of serious and potentially life-threatening illness.Greater than 50%  of this time was spent counseling and coordinating care related to the above assessment and plan.  Alda Lea, AGPCNP-BC  Palliative Medicine Team [phone]

## 2021-02-04 NOTE — Progress Notes (Signed)
Manufacturing engineer [facility]) Hospital Liaison note.    Received request from [place], [place] for family interest in [facility]. Chart has been reviewed and patient is eligible for transfer to [facility].    Family is coming to [facility] to sign consents this afternoon. Patient can transfer to [facility] tomorrow morning.   RN please call report to [phone].   A Please do not hesitate to call with questions.   Thank you,   Farrel Gordon, RN, [place] (listed on [facility] under [place])    [phone]

## 2021-02-04 NOTE — Progress Notes (Signed)
Mobility Specialist: Progress Note   02/04/21 1053  Mobility  Activity Contraindicated/medical hold   Instructed by RN to not see pt due to pt being palliative care.   [facility] Charletta Voight Mobility Specialist Mobility Specialist Phone: [phone]

## 2021-02-04 NOTE — Progress Notes (Signed)
PROGRESS NOTE    Trevor [name]  [id number] DOB: 09-Jul-1946 DOA: 02/01/2021 PCP: [name]   Brief Narrative:  Trevor [name] is an 75 y.o. 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  Patient was noted to have an infected graft with purulent drainage.    ED Course:  The patient was noted to be hypotensive despite use of crystalloid in the postop period.  Patient had been requiring Neo-Synephrine IV.  H&H had decreased by 2 points and he was transfused 1 unit PRBC which he tolerated without evidence of fluid overload.  Patient was able to come off Neo-Synephrine and subsequently became very hypertensive requiring as needed hydralazine.  Patient is now stable to come to the progressive unit status post transfusion with reasonable blood pressure and no evidence of fluid overload.  Patient had been seen by nephrology who noted no emergent need for dialysis at present.  He is status post excision of infected axillary bifemoral bypass on 3/4 by vascular surgery.  He is transition to full comfort measures and family requested for inpatient hospice.  Assessment & Plan:  Infected right axillary bifemoral graft: Status post removal of infected graft Severe end-stage peripheral vascular disease with possible gangrene Hypertensive urgency Acute on chronic anemia with intraoperative blood loss ESRD on hemodialysis Chronic combined systolic and diastolic CHF Paroxysmal A. fib COPD with ongoing tobacco abuse Coronary artery disease Anxiety and depression Goals of care  Patient  transitioned to full comfort measures on 3/6.  Appreciate a palliative care help.  Family requested for residential hospice-TOC is aware. Continue as needed medication as per palliative care.     DVT prophylaxis: SCD Code Status: DNR Family Communication: None present at bedside.  Plan of care discussed with patient in length and he verbalized understanding and agreed with it. Disposition Plan: To be determined  Consultants:   Vascular surgery  Palliative care  Procedures:   Status post removal of infected axillary bifemoral graft on 3/4 by vascular surgery  Antimicrobials:   Vancomycin  Cefepime  Status is: Inpatient   Dispo: The patient is from: [name]              Anticipated d/c is to: Inpatient hospice              Patient currently medically stable for the discharge   Difficult to place patient No    Subjective: Patient seen and examined.  Lying comfortably on the bed.  Tells me that he is not sure how is he feeling today.  Continues to have lower extremity pain.  No fever, chills, nausea or vomiting, chest pain or shortness of breath.  Objective: Vitals:   02/03/21 2037 02/04/21 0530 02/04/21 0739 02/04/21 0950  BP: (!) 154/61   (!) 159/97  Pulse:      Resp: '20 19  18  '$ Temp: 98.4 F (36.9 C)   97.7 F (36.5 C)  TempSrc: Oral   Oral  SpO2: 94% 92% (!) 89%   Weight:      Height:       No intake or output data in the 24 hours ending 02/04/21 1007 Filed  Weights   02/01/21 2200 02/02/21 0750 02/02/21 1130  Weight: 45.6 kg 45.3 kg 44.5 kg    Examination:  General exam: Elderly male, appears sick, lethargic and weak.  Very thin and lean, on nasal cannula respiratory system: Clear to auscultation. Respiratory effort normal. Cardiovascular system: S1 & S2 heard, RRR. No JVD, murmurs, rubs, gallops or clicks. No pedal edema. Gastrointestinal system: Abdomen is nondistended, soft and nontender. No organomegaly or masses felt. Normal bowel sounds heard. Central  nervous system: Alert and oriented. No focal neurological deficits. Extremities: Status post left AKA. Right foot: Peeling of the skin noted, cool to touch, no open wound/bleeding/discharge seen.  No palpable pedal pulses noted. Psychiatry: Judgement and insight appear normal. Mood & affect appropriate.    Data Reviewed: I have personally reviewed following labs and imaging studies  CBC: Recent Labs  Lab 02/01/21 0730 02/01/21 1353 02/01/21 2154 02/02/21 0225 02/03/21 0237  WBC 4.6  --  8.6 7.1 6.5  HGB 10.9* 8.2* 9.2* 8.7* 7.3*  HCT 34.5* 24.0* 27.6* 26.4* 21.9*  MCV 92.7  --  88.2 88.9 90.1  PLT 150  --  166 155 0000000*   Basic Metabolic Panel: Recent Labs  Lab 02/01/21 0730 02/01/21 1353 02/01/21 2154 02/02/21 0225  NA 136 138  --  136  K 5.1 4.7  --  4.6  CL 97*  --   --  100  CO2 28  --   --  24  GLUCOSE 71  --   --  124*  BUN 26*  --   --  29*  CREATININE 5.35*  --  5.68* 5.94*  CALCIUM 8.8*  --   --  8.8*  MG  --   --   --  2.1   GFR: Estimated Creatinine Clearance: 6.8 mL/min (A) (by C-G formula based on SCr of 5.94 mg/dL (H)). Liver Function Tests: Recent Labs  Lab 02/01/21 0730 02/02/21 0225  AST 22 18  ALT 15 9  ALKPHOS 110 81  BILITOT 0.9 1.1  PROT 6.3* 5.8*  ALBUMIN 2.6* 2.7*   No results for input(s): LIPASE, AMYLASE in the last 168 hours. No results for input(s): AMMONIA in the last 168 hours. Coagulation Profile: Recent Labs  Lab 02/01/21 0730  INR 1.2   Cardiac Enzymes: No results for input(s): CKTOTAL, CKMB, CKMBINDEX, TROPONINI in the last 168 hours. BNP (last 3 results) No results for input(s): PROBNP in the last 8760 hours. HbA1C: No results for input(s): HGBA1C in the last 72 hours. CBG: Recent Labs  Lab 02/01/21 0933 02/01/21 1608 02/01/21 1723 02/01/21 1838 02/02/21 1357  GLUCAP 71 57* 71 155* 99   Lipid Profile: No results for input(s): CHOL, HDL, LDLCALC, TRIG, CHOLHDL, LDLDIRECT in the last 72 hours. Thyroid  Function Tests: No results for input(s): TSH, T4TOTAL, FREET4, T3FREE, THYROIDAB in the last 72 hours. Anemia Panel: No results for input(s): VITAMINB12, FOLATE, FERRITIN, TIBC, IRON, RETICCTPCT in the last 72 hours. Sepsis Labs: No results for input(s): PROCALCITON, LATICACIDVEN in the last 168 hours.  Recent Results (from the past 240 hour(s))  SARS Coronavirus 2 by RT PCR (hospital order, performed in [facility] hospital lab) Nasopharyngeal Nasopharyngeal Swab     Status: None   Collection Time: 02/01/21  5:54 AM   Specimen: Nasopharyngeal Swab  Result Value Ref Range Status   SARS Coronavirus 2 NEGATIVE NEGATIVE Final    Comment: (NOTE) SARS-CoV-2 target nucleic acids are NOT DETECTED.  The SARS-CoV-2 RNA is generally detectable in upper  and lower respiratory specimens during the acute phase of infection. The lowest concentration of SARS-CoV-2 viral copies this assay can detect is 250 copies / mL. A negative result does not preclude SARS-CoV-2 infection and should not be used as the sole basis for treatment or other patient management decisions.  A negative result may occur with improper specimen collection / handling, submission of specimen other than nasopharyngeal swab, presence of viral mutation(s) within the areas targeted by this assay, and inadequate number of viral copies (<250 copies / mL). A negative result must be combined with clinical observations, patient history, and epidemiological information.  Fact Sheet for Patients:   StrictlyIdeas.no  Fact Sheet for Healthcare Providers: BankingDealers.co.za  This test is not yet approved or  cleared by the [place] FDA and has been authorized for detection and/or diagnosis of SARS-CoV-2 by FDA under an Emergency Use Authorization (EUA).  This EUA will remain in effect (meaning this test can be used) for the duration of the COVID-19 declaration under Section 564(b)(1)  of the Act, 21 U.S.C. section 360bbb-3(b)(1), unless the authorization is terminated or revoked sooner.  Performed at [facility], [place] [address], [place], [place] [address]   Aerobic/Anaerobic Culture w Gram Stain (surgical/deep wound)     Status: None (Preliminary result)   Collection Time: 02/01/21 11:30 AM   Specimen: Groin, Right; Wound  Result Value Ref Range Status   Specimen Description WOUND  Final   Special Requests RIGHT GROIN WOUND SPEC A  Final   Gram Stain NO WBC SEEN NO ORGANISMS SEEN   Final   Culture   Final    CULTURE REINCUBATED FOR BETTER GROWTH Performed at [facility], [address] [place], [place] [address]    Report Status PENDING  Incomplete      Radiology Studies: DG HIP UNILAT WITH PELVIS 2-3 VIEWS RIGHT  Result Date: 02/03/2021 CLINICAL DATA:  RIGHT hip pain.  History of RIGHT groin infection. EXAM: DG HIP (WITH OR WITHOUT PELVIS) 2-3V RIGHT COMPARISON:  10/15/2012 radiograph FINDINGS: Diffuse osteopenia is noted. No definite fracture or osteomyelitis noted. No dislocation or focal bony lesions are noted. Heavy vascular calcifications are present. IMPRESSION: Diffuse osteopenia without definite acute bony abnormality. Electronically Signed   By: Margarette Canada M.D.   On: 02/03/2021 11:22    Scheduled Meds: . acetaminophen  650 mg Oral Q6H  . gabapentin  100 mg Oral 2 times per day  . gabapentin  300 mg Oral QHS  . sodium chloride flush  3 mL Intravenous Q12H  . umeclidinium bromide  1 puff Inhalation Daily   Continuous Infusions: . sodium chloride       LOS: 3 days   Time spent: 35 minutes  Leigh Blas Loann Quill, MD [facility]  If 7PM-7AM, please contact night-coverage [email or web address] 02/04/2021, 10:07 AM

## 2021-02-04 NOTE — Progress Notes (Addendum)
[facility]  Subjective  - Resting this am   Objective (!) 154/61 93 98.4 F (36.9 C) (Oral) 19 92% No intake or output data in the 24 hours ending 02/04/21 0718  Lungs non labored breathing Supine with hip contracted Incisions healing fine   Assessment/Planning: POD # 3 75 y.o. male is s/p removal of infected ax-bifemoral bypass  Palliative care plan for transition to comfort care with family in agreement. Recommendations listed in Palliative note.   Roxy Horseman 02/04/2021 7:18 AM --  Laboratory Lab Results: Recent Labs    02/02/21 0225 02/03/21 0237  WBC 7.1 6.5  HGB 8.7* 7.3*  HCT 26.4* 21.9*  PLT 155 138*   BMET Recent Labs    02/01/21 0730 02/01/21 1353 02/01/21 2154 02/02/21 0225  NA 136 138  --  136  K 5.1 4.7  --  4.6  CL 97*  --   --  100  CO2 28  --   --  24  GLUCOSE 71  --   --  124*  BUN 26*  --   --  29*  CREATININE 5.35*  --  5.68* 5.94*  CALCIUM 8.8*  --   --  8.8*    COAG Lab Results  Component Value Date   INR 1.2 02/01/2021   INR 1.2 07/09/2020   INR 1.04 11/11/2018   No results found for: PTT  VASCULAR STAFF ADDENDUM: I have independently interviewed and examined the patient. I agree with the above.  Transitioning to comfort measures. Following.   Yevonne Aline. Stanford Breed, MD [facility] Phone Number: [phone] 02/04/2021 10:48 AM

## 2021-02-04 NOTE — Progress Notes (Signed)
Subjective:   Patient is on comfort care now, HD cancelled for today     Objective Vital signs in last 24 hours: Vitals:   02/03/21 2037 02/04/21 0530 02/04/21 0739 02/04/21 0950  BP: (!) 154/61   (!) 159/97  Pulse:      Resp: '20 19  18  '$ Temp: 98.4 F (36.9 C)   97.7 F (36.5 C)  TempSrc: Oral   Oral  SpO2: 94% 92% (!) 89% 90%  Weight:      Height:       Weight change:  No intake or output data in the 24 hours ending 02/04/21 1200    Assessment/Plan: [name] with multiple medical issues including ESRD-  Now admitted for axillary bifem vascular graft removal due to infection  1 infected vascular graft-  S/p removal per VVS , on cefepime and vanc.  Thought is that this will be a terminal situation as he does not have any options for revascularization. Pt is now on comfort care. No further dialysis. Will sign off.  2 ESRD: normally MWF via AVF.  HD today has been cancelled.  3. Anemia of ESRD: required transfusion post op-  Latest hgb lower at 7.3 4. Metabolic Bone Disease:  continue hectorol and sensipar- also on renvela normally but not really eating much at this time  Kelly Splinter, MD 02/04/2021, 12:02 PM     Labs: Basic Metabolic Panel: Recent Labs  Lab 02/01/21 0730 02/01/21 1353 02/01/21 2154 02/02/21 0225  NA 136 138  --  136  K 5.1 4.7  --  4.6  CL 97*  --   --  100  CO2 28  --   --  24  GLUCOSE 71  --   --  124*  BUN 26*  --   --  29*  CREATININE 5.35*  --  5.68* 5.94*  CALCIUM 8.8*  --   --  8.8*   Liver Function Tests: Recent Labs  Lab 02/01/21 0730 02/02/21 0225  AST 22 18  ALT 15 9  ALKPHOS 110 81  BILITOT 0.9 1.1  PROT 6.3* 5.8*  ALBUMIN 2.6* 2.7*   No results for input(s): LIPASE, AMYLASE in the last 168 hours. No results for input(s): AMMONIA in the last 168 hours. CBC: Recent Labs  Lab 02/01/21 0730 02/01/21 1353 02/01/21 2154 02/02/21 0225 02/03/21 0237  WBC 4.6  --  8.6 7.1 6.5  HGB 10.9*   < > 9.2* 8.7* 7.3*  HCT 34.5*    < > 27.6* 26.4* 21.9*  MCV 92.7  --  88.2 88.9 90.1  PLT 150  --  166 155 138*   < > = values in this interval not displayed.   Cardiac Enzymes: No results for input(s): CKTOTAL, CKMB, CKMBINDEX, TROPONINI in the last 168 hours. CBG: Recent Labs  Lab 02/01/21 0933 02/01/21 1608 02/01/21 1723 02/01/21 1838 02/02/21 1357  GLUCAP 71 57* 71 155* 99    Iron Studies: No results for input(s): IRON, TIBC, TRANSFERRIN, FERRITIN in the last 72 hours. Studies/Results: DG HIP UNILAT WITH PELVIS 2-3 VIEWS RIGHT  Result Date: 02/03/2021 CLINICAL DATA:  RIGHT hip pain.  History of RIGHT groin infection. EXAM: DG HIP (WITH OR WITHOUT PELVIS) 2-3V RIGHT COMPARISON:  10/15/2012 radiograph FINDINGS: Diffuse osteopenia is noted. No definite fracture or osteomyelitis noted. No dislocation or focal bony lesions are noted. Heavy vascular calcifications are present. IMPRESSION: Diffuse osteopenia without definite acute bony abnormality. Electronically Signed   By: Margarette Canada  M.D.   On: 02/03/2021 11:22   Medications: Infusions: . sodium chloride      Scheduled Medications: . acetaminophen  650 mg Oral Q6H  . gabapentin  100 mg Oral 2 times per day  . gabapentin  300 mg Oral QHS  . sodium chloride flush  3 mL Intravenous Q12H  . umeclidinium bromide  1 puff Inhalation Daily    have reviewed scheduled and prn medications.  Physical Exam: General: can get comfortable- sister here  Heart: RRR Lungs: mostly clear-  Sat 99% Abdomen: soft, non tender Extremities: no edema-  Dry ischemic appearing limbs Dialysis Access: left AVF patent     02/04/2021,12:00 PM  LOS: 3 days

## 2021-02-04 NOTE — Social Work (Signed)
CSW called -left voice message with patient's daughter, Maree Erie and Darlene-CSW waiting on response.  Thurmond Butts, MSW, LCSW Clinical Social Worker

## 2021-02-04 NOTE — Anesthesia Postprocedure Evaluation (Signed)
Anesthesia Post Note  Patient: ORIA SIMON  Procedure(s) Performed: REMOVAL OF AXILLARY-BIFEMORAL BYPASS GRAFT (Bilateral Groin) VEIN PATCH ANGIOPLASTY OF RIGHT AXILLARY ARTERY AND LEFT COMMON FEMORAL ARTERY (Right Groin) GREATER SAPHENOUS VEIN HARVEST (Right Leg Upper) REMOVAL OF FEMORAL-FEMORAL ARTERY BYPASS GRAFT (Bilateral Groin) REDO EXPOSURE LEFT COMMON FEMORAL ARTERY (Left Groin)     Patient location during evaluation: PACU Anesthesia Type: General Level of consciousness: awake and alert Pain management: pain level controlled Vital Signs Assessment: post-procedure vital signs reviewed and stable Respiratory status: spontaneous breathing, nonlabored ventilation, respiratory function stable and patient connected to nasal cannula oxygen Cardiovascular status: blood pressure returned to baseline and stable Postop Assessment: no apparent nausea or vomiting Anesthetic complications: no   No complications documented.  Last Vitals:  Vitals:   02/04/21 0530 02/04/21 0739  BP:    Pulse:    Resp: 19   Temp:    SpO2: 92% (!) 89%    Last Pain:  Vitals:   02/03/21 2220  TempSrc:   PainSc: Asleep                 Athena Baltz

## 2021-02-05 DIAGNOSIS — I5042 Chronic combined systolic (congestive) and diastolic (congestive) heart failure: Secondary | ICD-10-CM

## 2021-02-05 DIAGNOSIS — I480 Paroxysmal atrial fibrillation: Secondary | ICD-10-CM | POA: Diagnosis not present

## 2021-02-05 DIAGNOSIS — I999 Unspecified disorder of circulatory system: Secondary | ICD-10-CM

## 2021-02-05 DIAGNOSIS — T827XXA Infection and inflammatory reaction due to other cardiac and vascular devices, implants and grafts, initial encounter: Secondary | ICD-10-CM | POA: Diagnosis not present

## 2021-02-05 DIAGNOSIS — I10 Essential (primary) hypertension: Secondary | ICD-10-CM | POA: Diagnosis not present

## 2021-02-05 DIAGNOSIS — I160 Hypertensive urgency: Secondary | ICD-10-CM

## 2021-02-05 DIAGNOSIS — I714 Abdominal aortic aneurysm, without rupture: Secondary | ICD-10-CM | POA: Diagnosis not present

## 2021-02-05 LAB — TYPE AND SCREEN
ABO/RH(D): B POS
Antibody Screen: NEGATIVE
Unit division: 0
Unit division: 0

## 2021-02-05 LAB — BPAM RBC
Blood Product Expiration Date: 202203302359
Blood Product Expiration Date: 202203302359
ISSUE DATE / TIME: 202203041340
ISSUE DATE / TIME: 202203041340
Unit Type and Rh: 7300
Unit Type and Rh: 7300

## 2021-02-06 LAB — AEROBIC/ANAEROBIC CULTURE W GRAM STAIN (SURGICAL/DEEP WOUND): Gram Stain: NONE SEEN

## 2021-03-01 NOTE — Progress Notes (Signed)
   Patient open eyes, but does not speak or move. Right subclavian incision without drainage or hematoma Groin incisions intact Right leg cold to touch Lungs non labored breathing  POD # 3 75 y.o.maleis s/p removal of infected ax-bifemoral bypass  Palliative care plan for transition to comfort care with family in agreement.  Plan for discharge to Hospice facility.

## 2021-03-01 NOTE — Discharge Summary (Signed)
Physician Discharge Summary  Trevor [name] [id number] DOB: [birth year] DOA: 02/01/2021  PCP: [name]  Admit date: 02/01/2021 Discharge date: Feb 22, 2021  Admitted From: Home Disposition: Inpatient hospice-[facility]  Recommendations for Outpatient Follow-up:  1. Cont. PRN pain medications 2. Cont. Oxygen PRN to help with breathing 3. No dietary restrictions 4. Goal-is to keep him comfortable  Home Health: None Equipment/Devices: None Discharge Condition: Fair CODE STATUS: DNR Diet recommendation: Regular diet  Brief/Interim Summary: Delton See Statonis an 75 y.o.malewith PMH significant for advanced vasculopathy including PVD status post leftAKA and right axillary bifemoral bypass, AAA, CAD status post NSTEMI with subsequent systolic and diastolic heart failure, ESRD on HD, accelerated hypertension, history of atrial fibrillation previous history of cocaine use who lives in a nursing home and is wheelchair-bound was seen in vascular surgery clinic on February 15th for drainage from his right groin. Patient was noted to have an infected graft with purulent drainage.   ED Course:The patient was noted to be hypotensive despite use of crystalloid in the postop period. Patient had been requiring Neo-Synephrine IV. H&H had decreased by 2 points and he was transfused 1 unit PRBC which he tolerated without evidence of fluid overload. Patient was able to come off Neo-Synephrine and subsequently became very hypertensive requiring as needed hydralazine.Patient had been seen by nephrology who noted no emergent need for dialysis at present.  He is status post excision of infected axillary bifemoral bypass on 3/4 by vascular surgery.    Neurology consulted for dialysis.  Palliative care consulted to discuss goals of care with patient's family.  He transitioned to full comfort measures and family requested for inpatient hospice-TOC consulted.  Assessment and plan:  Infected  right axillary bifemoral graft: Status post removal of infected graft Status post excision of infected axillary bifemoral bypass on 3/4 Severe end-stage peripheral vascular disease with possible gangrene Hypertensive urgency Acute on chronic anemia with intraoperative blood loss ESRD on hemodialysis Chronic combined systolic and diastolic CHF Paroxysmal A. fib COPD with ongoing tobacco abuse Coronary artery disease Anxiety and depression Goals of care  Patient transitioned to full comfort measures on 3/6.  Appreciated palliative care help. Family requested for residential hospice-placed on as needed medications.  Vascular surgery and nephrology signed off.  Patient will be discharged to [facility] today.   Discharge Diagnoses:  Infected right axillary bifemoral graft: Status post removal of infected graft Severe end-stage peripheral vascular disease with possible gangrene Hypertensive urgency Acute on chronic anemia with intraoperative blood loss ESRD on hemodialysis Chronic combined systolic and diastolic CHF Paroxysmal A. fib COPD with ongoing tobacco abuse Coronary artery disease Anxiety and depression  Discharge Instructions  Discharge Instructions    Diet general   Complete by: As directed    Discharge instructions   Complete by: As directed    Cont. PRN pain medications Cont. Oxygen PRN to help with breathing No dietary restrictions Goal-is to keep him comfortable   Increase activity slowly   Complete by: As directed    No dressing needed   Complete by: As directed      Allergies as of Feb 22, 2021   No Known Allergies     Medication List    STOP taking these medications   aspirin EC 81 MG tablet   atorvastatin 80 MG tablet Commonly known as: LIPITOR   b complex-vitamin c-folic acid 0.8 MG Tabs tablet   clopidogrel 75 MG tablet Commonly known as: PLAVIX   COMBIVENT IN   docusate sodium 100 MG  capsule Commonly known as: COLACE   gabapentin 100 MG  capsule Commonly known as: NEURONTIN   gabapentin 300 MG capsule Commonly known as: NEURONTIN   isosorbide mononitrate 30 MG 24 hr tablet Commonly known as: IMDUR   metoprolol tartrate 25 MG tablet Commonly known as: LOPRESSOR   mirtazapine 7.5 MG tablet Commonly known as: REMERON   ondansetron 4 MG tablet Commonly known as: ZOFRAN   polyethylene glycol powder 17 GM/SCOOP powder Commonly known as: GLYCOLAX/MIRALAX   senna 8.6 MG Tabs tablet Commonly known as: SENOKOT   sevelamer carbonate 800 MG tablet Commonly known as: RENVELA   Spiriva HandiHaler 18 MCG inhalation capsule Generic drug: tiotropium   traZODone 50 MG tablet Commonly known as: DESYREL   Vascepa 1 g capsule Generic drug: icosapent Ethyl     TAKE these medications   nitroGLYCERIN 0.4 MG SL tablet Commonly known as: NITROSTAT Place 1 tablet (0.4 mg total) under the tongue every 5 (five) minutes x 3 doses as needed for chest pain.            Discharge Care Instructions  (From admission, onward)         Start     Ordered   March 05, 2021 0000  No dressing needed        05-Mar-2021 0848          No Known Allergies  Consultations:  Vascular surgery  Nephrology  Palliative care   Procedures/Studies: CT Abdomen Pelvis W Contrast  Result Date: 01/29/2021 CLINICAL DATA:  Draining sinus track in the right groin. EXAM: CT ABDOMEN AND PELVIS WITH CONTRAST TECHNIQUE: Multidetector CT imaging of the abdomen and pelvis was performed using the standard protocol following bolus administration of intravenous contrast. CONTRAST:  140m ISOVUE-300 IOPAMIDOL (ISOVUE-300) INJECTION 61% COMPARISON:  CT dated June 28, 2017. FINDINGS: Lower chest: Emphysematous changes are noted. There is scarring at the lung bases.There is severe cardiomegaly. Hepatobiliary: The liver is normal. The gallbladder is distended. Diffuse calcifications are noted throughout the gallbladder wall.There is no biliary ductal dilation.  Pancreas: Normal contours without ductal dilatation. No peripancreatic fluid collection. Spleen: Unremarkable. Adrenals/Urinary Tract: --Adrenal glands: Unremarkable. --Right kidney/ureter: Multiple cysts are noted throughout the right kidney. The right kidney is atrophic. --Left kidney/ureter: Multiple cysts are noted throughout the left kidney. The left kidney is atrophic. --Urinary bladder: Unremarkable. Stomach/Bowel: --Stomach/Duodenum: No hiatal hernia or other gastric abnormality. Normal duodenal course and caliber. --Small bowel: Unremarkable. --Colon: There is a large amount of stool throughout the colon. --Appendix: Not visualized. No right lower quadrant inflammation or free fluid. Vascular/Lymphatic: Extensive atherosclerotic changes are noted throughout the abdominal aorta. There is an infrarenal abdominal aortic aneurysm that appears to be saccular in appearance. This aneurysm has increased in size from prior study and currently measures approximately 3.8 cm in diameter (axial series 2, image 31). There is moderate to high-grade stenosis of the proximal SMA and celiac axis. There is a grossly patent right-sided ax fem bypass graft. The graft appears patent where visualized. Fluid is noted tracking along the course of the bypass graft. There is a pocket of fluid surrounding the graft near the left femoral anastomosis. This collection measures approximately 3.1 cm (axial series 2, image 57). There is fat stranding around the right femoral component of the graft without clear evidence for a drainable collection. The right common iliac artery is occluded. Extensive atherosclerotic disease is noted throughout the iliac vasculature. The right SFA is occluded proximally. The left SFA appears to be occluded proximally. --No  retroperitoneal lymphadenopathy. --No mesenteric lymphadenopathy. --No pelvic or inguinal lymphadenopathy. Reproductive: Prostate gland is severely enlarged. Other: No ascites or free air.  The abdominal wall is normal. Musculoskeletal. No acute displaced fractures. IMPRESSION: 1. Status post placement of a right ax fem bypass graft. The graft is grossly patent where visualized. There is fluid tracking along the graft in the anterior abdominal wall. Additionally, there is a fluid collection in the low left anterior abdominal wall. This may represent a postoperative seroma or hematoma with an abscess not excluded. Fat stranding is noted along the graft as it courses through the low right anterior abdominal wall in the right inguinal region. An abscess is not identified at this location. 2. 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 Recommend referral to a vascular specialist. This recommendation follows ACR consensus guidelines: White Paper of the ACR Incidental Findings Committee II on Vascular Findings. J Am Coll Radiol 2013; 10:789-794. 3. Persistent diffuse calcifications throughout the gallbladder, similar to prior study. This is consistent with a porcelain gallbladder as previously described. 4. Cardiomegaly. 5. Significant prostatomegaly. 6. Peripheral vascular disease as detailed above. 7. Aortic Atherosclerosis (ICD10-I70.0) and Emphysema (ICD10-J43.9). Electronically Signed   By: Constance Holster M.D.   On: 01/29/2021 20:07   DG CHEST PORT 1 VIEW  Result Date: 02/01/2021 CLINICAL DATA:  Shortness of breath EXAM: PORTABLE CHEST 1 VIEW COMPARISON:  07/09/2020 FINDINGS: Left upper extremity vascular stent. No focal opacity or pleural effusion. Stable cardiomediastinal silhouette with aortic atherosclerosis. No pneumothorax. IMPRESSION: No active disease. Electronically Signed   By: Donavan Foil M.D.   On: 02/01/2021 19:40   VAS [place] ABI WITH/WO TBI  Result Date: 01/15/2021 LOWER EXTREMITY DOPPLER STUDY Indications: Peripheral artery disease, and Numbness of the right foot, Right              leg/ foot coolness. High Risk Factors: Hypertension, coronary artery  disease. Other Factors: Right leg/ foot coolness.  Vascular Interventions: RIGHT Axilla-bi-femoral BPG 06/28/2017.                         Left AKA 03/01/2019. Comparison Study: 05/17/20 Performing Technologist: June Leap RDMS, RVT  Examination Guidelines: A complete evaluation includes at minimum, Doppler waveform signals and systolic blood pressure reading at the level of bilateral brachial, anterior tibial, and posterior tibial arteries, when vessel segments are accessible. Bilateral testing is considered an integral part of a complete examination. Photoelectric Plethysmograph (PPG) waveforms and toe systolic pressure readings are included as required and additional duplex testing as needed. Limited examinations for reoccurring indications may be performed as noted.  ABI Findings: +---------+------------------+-----+-------------------+--------+  Right     Rt Pressure (mmHg) Index Waveform            Comment   +---------+------------------+-----+-------------------+--------+  Brachial  109                                                    +---------+------------------+-----+-------------------+--------+  ATA                                absent                        +---------+------------------+-----+-------------------+--------+  PTA  110                1.01  dampened monophasic           +---------+------------------+-----+-------------------+--------+  Great Toe                          Absent                        +---------+------------------+-----+-------------------+--------+ +-------+-----------+-----------+------------+------------+  ABI/TBI Today's ABI Today's TBI Previous ABI Previous TBI  +-------+-----------+-----------+------------+------------+  Right   1.01        0           [place]           0.37          +-------+-----------+-----------+------------+------------+ Arterial wall calcification precludes accurate ankle pressures and ABIs.  Summary: Right: Resting right ankle-brachial index is  within normal range. ABIs are unreliable/ falsely elevated due to calcified vessels.  *See table(s) above for measurements and observations.  Electronically signed by Monica Martinez MD on 01/15/2021 at 11:40:51 AM.   Final    DG HIP UNILAT WITH PELVIS 2-3 VIEWS RIGHT  Result Date: 02/03/2021 CLINICAL DATA:  RIGHT hip pain.  History of RIGHT groin infection. EXAM: DG HIP (WITH OR WITHOUT PELVIS) 2-3V RIGHT COMPARISON:  10/15/2012 radiograph FINDINGS: Diffuse osteopenia is noted. No definite fracture or osteomyelitis noted. No dislocation or focal bony lesions are noted. Heavy vascular calcifications are present. IMPRESSION: Diffuse osteopenia without definite acute bony abnormality. Electronically Signed   By: Margarette Canada M.D.   On: 02/03/2021 11:22       Subjective: Patient seen and examined.  Resting comfortably on the bed.  Remained afebrile.  No acute events overnight.  Discharge Exam: Vitals:   02/04/21 0950 02-14-2021 0600  BP: (!) 159/97 (!) 155/65  Pulse:  72  Resp: 18 18  Temp: 97.7 F (36.5 C) 98.5 F (36.9 C)  SpO2: 90%    Vitals:   02/04/21 0530 02/04/21 0739 02/04/21 0950 February 14, 2021 0600  BP:   (!) 159/97 (!) 155/65  Pulse:    72  Resp: '19  18 18  '$ Temp:   97.7 F (36.5 C) 98.5 F (36.9 C)  TempSrc:   Oral Oral  SpO2: 92% (!) 89% 90%   Weight:      Height:        General: Elderly looking, not in acute distress, appears sick, weak, on nasal cannula, resting comfortably Cardiovascular: RRR, S1/S2 +, no rubs, no gallops Respiratory: CTA bilaterally, no wheezing, no rhonchi Abdominal: Soft, NT, ND, bowel sounds + Extremities: Status post left AKA. Right foot: Peeling of the skin noted, cool to touch, no open wound/bleeding or discharge seen.  No palpable pedal pulses noted. Skin: Right subclavian incision without drainage or hematoma. Groin incisions: Intact.  No signs of active bleeding or discharge seen.   The results of significant diagnostics from this  hospitalization (including imaging, microbiology, ancillary and laboratory) are listed below for reference.     Microbiology: Recent Results (from the past 240 hour(s))  SARS Coronavirus 2 by RT PCR (hospital order, performed in [facility] hospital lab) Nasopharyngeal Nasopharyngeal Swab     Status: None   Collection Time: 02/01/21  5:54 AM   Specimen: Nasopharyngeal Swab  Result Value Ref Range Status   SARS Coronavirus 2 NEGATIVE NEGATIVE Final    Comment: (NOTE) SARS-CoV-2 target nucleic acids are NOT DETECTED.  The  SARS-CoV-2 RNA is generally detectable in upper and lower respiratory specimens during the acute phase of infection. The lowest concentration of SARS-CoV-2 viral copies this assay can detect is 250 copies / mL. A negative result does not preclude SARS-CoV-2 infection and should not be used as the sole basis for treatment or other patient management decisions.  A negative result may occur with improper specimen collection / handling, submission of specimen other than nasopharyngeal swab, presence of viral mutation(s) within the areas targeted by this assay, and inadequate number of viral copies (<250 copies / mL). A negative result must be combined with clinical observations, patient history, and epidemiological information.  Fact Sheet for Patients:   StrictlyIdeas.no  Fact Sheet for Healthcare Providers: BankingDealers.co.za  This test is not yet approved or  cleared by the [place] FDA and has been authorized for detection and/or diagnosis of SARS-CoV-2 by FDA under an Emergency Use Authorization (EUA).  This EUA will remain in effect (meaning this test can be used) for the duration of the COVID-19 declaration under Section 564(b)(1) of the Act, 21 U.S.C. section 360bbb-3(b)(1), unless the authorization is terminated or revoked sooner.  Performed at [facility], [place] [address], [place],  [place] [address]   Aerobic/Anaerobic Culture w Gram Stain (surgical/deep wound)     Status: None (Preliminary result)   Collection Time: 02/01/21 11:30 AM   Specimen: Groin, Right; Wound  Result Value Ref Range Status   Specimen Description WOUND  Final   Special Requests RIGHT GROIN WOUND SPEC A  Final   Gram Stain   Final    NO WBC SEEN NO ORGANISMS SEEN Performed at [facility], [address] [place], [place] [address]    Culture   Final    RARE CORYNEBACTERIUM STRIATUM Standardized susceptibility testing for this organism is not available. NO ANAEROBES ISOLATED; CULTURE IN PROGRESS FOR 5 DAYS    Report Status PENDING  Incomplete     Labs: BNP (last 3 results) No results for input(s): BNP in the last 8760 hours. Basic Metabolic Panel: Recent Labs  Lab 02/01/21 0730 02/01/21 1353 02/01/21 2154 02/02/21 0225  NA 136 138  --  136  K 5.1 4.7  --  4.6  CL 97*  --   --  100  CO2 28  --   --  24  GLUCOSE 71  --   --  124*  BUN 26*  --   --  29*  CREATININE 5.35*  --  5.68* 5.94*  CALCIUM 8.8*  --   --  8.8*  MG  --   --   --  2.1   Liver Function Tests: Recent Labs  Lab 02/01/21 0730 02/02/21 0225  AST 22 18  ALT 15 9  ALKPHOS 110 81  BILITOT 0.9 1.1  PROT 6.3* 5.8*  ALBUMIN 2.6* 2.7*   No results for input(s): LIPASE, AMYLASE in the last 168 hours. No results for input(s): AMMONIA in the last 168 hours. CBC: Recent Labs  Lab 02/01/21 0730 02/01/21 1353 02/01/21 2154 02/02/21 0225 02/03/21 0237  WBC 4.6  --  8.6 7.1 6.5  HGB 10.9* 8.2* 9.2* 8.7* 7.3*  HCT 34.5* 24.0* 27.6* 26.4* 21.9*  MCV 92.7  --  88.2 88.9 90.1  PLT 150  --  166 155 138*   Cardiac Enzymes: No results for input(s): CKTOTAL, CKMB, CKMBINDEX, TROPONINI in the last 168 hours. BNP: Invalid input(s): POCBNP CBG: Recent Labs  Lab 02/01/21 0933 02/01/21 1608  02/01/21 1723 02/01/21 1838 02/02/21 1357  GLUCAP 71 57* 71 155* 99   D-Dimer No results for input(s): DDIMER in the  last 72 hours. Hgb A1c No results for input(s): HGBA1C in the last 72 hours. Lipid Profile No results for input(s): CHOL, HDL, LDLCALC, TRIG, CHOLHDL, LDLDIRECT in the last 72 hours. Thyroid function studies No results for input(s): TSH, T4TOTAL, T3FREE, THYROIDAB in the last 72 hours.  Invalid input(s): FREET3 Anemia work up No results for input(s): VITAMINB12, FOLATE, FERRITIN, TIBC, IRON, RETICCTPCT in the last 72 hours. Urinalysis    Component Value Date/Time   COLORURINE YELLOW 04/04/2017 1833   APPEARANCEUR CLEAR 04/04/2017 1833   LABSPEC 1.008 04/04/2017 1833   PHURINE 9.0 (H) 04/04/2017 1833   GLUCOSEU 50 (A) 04/04/2017 1833   HGBUR MODERATE (A) 04/04/2017 1833   BILIRUBINUR NEGATIVE 04/04/2017 1833   KETONESUR NEGATIVE 04/04/2017 1833   PROTEINUR 100 (A) 04/04/2017 1833   UROBILINOGEN 0.2 01/08/2015 2116   NITRITE NEGATIVE 04/04/2017 1833   LEUKOCYTESUR NEGATIVE 04/04/2017 1833   Sepsis Labs Invalid input(s): PROCALCITONIN,  WBC,  LACTICIDVEN Microbiology Recent Results (from the past 240 hour(s))  SARS Coronavirus 2 by RT PCR (hospital order, performed in [facility] hospital lab) Nasopharyngeal Nasopharyngeal Swab     Status: None   Collection Time: 02/01/21  5:54 AM   Specimen: Nasopharyngeal Swab  Result Value Ref Range Status   SARS Coronavirus 2 NEGATIVE NEGATIVE Final    Comment: (NOTE) SARS-CoV-2 target nucleic acids are NOT DETECTED.  The SARS-CoV-2 RNA is generally detectable in upper and lower respiratory specimens during the acute phase of infection. The lowest concentration of SARS-CoV-2 viral copies this assay can detect is 250 copies / mL. A negative result does not preclude SARS-CoV-2 infection and should not be used as the sole basis for treatment or other patient management decisions.  A negative result may occur with improper specimen collection / handling, submission of specimen other than nasopharyngeal swab, presence of viral mutation(s)  within the areas targeted by this assay, and inadequate number of viral copies (<250 copies / mL). A negative result must be combined with clinical observations, patient history, and epidemiological information.  Fact Sheet for Patients:   StrictlyIdeas.no  Fact Sheet for Healthcare Providers: BankingDealers.co.za  This test is not yet approved or  cleared by the [place] FDA and has been authorized for detection and/or diagnosis of SARS-CoV-2 by FDA under an Emergency Use Authorization (EUA).  This EUA will remain in effect (meaning this test can be used) for the duration of the COVID-19 declaration under Section 564(b)(1) of the Act, 21 U.S.C. section 360bbb-3(b)(1), unless the authorization is terminated or revoked sooner.  Performed at [facility], [place] [address], [place], [place] [address]   Aerobic/Anaerobic Culture w Gram Stain (surgical/deep wound)     Status: None (Preliminary result)   Collection Time: 02/01/21 11:30 AM   Specimen: Groin, Right; Wound  Result Value Ref Range Status   Specimen Description WOUND  Final   Special Requests RIGHT GROIN WOUND SPEC A  Final   Gram Stain   Final    NO WBC SEEN NO ORGANISMS SEEN Performed at [facility], [address] [place], [place] [address]    Culture   Final    RARE CORYNEBACTERIUM STRIATUM Standardized susceptibility testing for this organism is not available. NO ANAEROBES ISOLATED; CULTURE IN PROGRESS FOR 5 DAYS    Report Status PENDING  Incomplete     Time  coordinating discharge: Over 30 minutes  SIGNED:   Mckinley Jewel, MD  [facility] 2021-03-06, 8:49 AM Pager   If 7PM-7AM, please contact night-coverage [email or web address]

## 2021-03-01 NOTE — Progress Notes (Addendum)
Called report to RN in the [employer]. Handed pt's envelop and wheelchair to pt's sister '1mg'$  dilaudid given per order of pallative care nurse. Handed AVS to [place], RN

## 2021-03-01 NOTE — TOC Transition Note (Signed)
Transition of Care [facility]) - CM/SW Discharge Note   Patient Details  Name: Trevor [name] MRN: [id number] Date of Birth: Aug 20, 1946  [facility] CM/SW Contact:  Vinie Sill, [place] Phone Number: 2021-03-02, 10:16 AM   Clinical Narrative:     Patient will Discharge to: [facility]  Discharge Date: 03/02/2021 Family Notified: darlene,sister Transport [phone]  Per MD patient is ready for discharge. RN, patient, and facility notified of DC. Discharge Summary sent to facility. RN given number for [phone]. Ambulance transport requested for patient.   Clinical Social Worker signing off.  Thurmond Butts, MSW, LCSW Clinical Social Worker   Final next level of care: [place] Barriers to Discharge: Barriers Resolved   Patient Goals and CMS Choice        Discharge Placement              Patient chooses bed at:  [facility]) Patient to be transferred to facility by: [place] Name of family member notified: Darlene,sister Patient and family notified of of transfer: 03-02-2021  Discharge Plan and Services In-house Referral: Clinical Social Work                                   Social Determinants of Health (SDOH) Interventions     Readmission Risk Interventions No flowsheet data found.

## 2021-03-01 DEATH — deceased
